# Patient Record
Sex: Male | Born: 1970 | Race: Black or African American | Hispanic: Refuse to answer | Marital: Married
Health system: Midwestern US, Community
[De-identification: ages and names within clinical notes are randomized; demographics above are authoritative.]

## PROBLEM LIST (undated history)

## (undated) DIAGNOSIS — I5043 Acute on chronic combined systolic (congestive) and diastolic (congestive) heart failure: Secondary | ICD-10-CM

## (undated) DIAGNOSIS — K759 Inflammatory liver disease, unspecified: Secondary | ICD-10-CM

## (undated) DIAGNOSIS — N189 Chronic kidney disease, unspecified: Secondary | ICD-10-CM

## (undated) DIAGNOSIS — F1010 Alcohol abuse, uncomplicated: Secondary | ICD-10-CM

## (undated) DIAGNOSIS — F1920 Other psychoactive substance dependence, uncomplicated: Secondary | ICD-10-CM

## (undated) DIAGNOSIS — G4730 Sleep apnea, unspecified: Secondary | ICD-10-CM

## (undated) DIAGNOSIS — I10 Essential (primary) hypertension: Secondary | ICD-10-CM

## (undated) DIAGNOSIS — I340 Nonrheumatic mitral (valve) insufficiency: Secondary | ICD-10-CM

## (undated) DIAGNOSIS — N289 Disorder of kidney and ureter, unspecified: Secondary | ICD-10-CM

## (undated) DIAGNOSIS — K7460 Unspecified cirrhosis of liver: Secondary | ICD-10-CM

## (undated) HISTORY — PX: AV FISTULA PLACEMENT: SHX1204

## (undated) HISTORY — DX: Acute on chronic combined systolic (congestive) and diastolic (congestive) heart failure: I50.43

---

## 2010-12-31 LAB — CBC WITH AUTOMATED DIFF
ABS. BASOPHILS: 0 10*3/uL (ref 0.0–0.06)
ABS. EOSINOPHILS: 0 10*3/uL (ref 0.0–0.4)
ABS. LYMPHOCYTES: 1.2 10*3/uL (ref 0.8–3.5)
ABS. MONOCYTES: 1.4 10*3/uL — ABNORMAL HIGH (ref 0–1.0)
ABS. NEUTROPHILS: 8.3 10*3/uL — ABNORMAL HIGH (ref 1.8–8.0)
BAND NEUTROPHILS: 1 % (ref 0–5)
BASOPHILS: 0 % (ref 0–3)
EOSINOPHILS: 0 % (ref 0–5)
HCT: 41.6 % (ref 36.0–48.0)
HGB: 13.8 g/dL (ref 13.0–16.0)
LYMPHOCYTES: 11 % — ABNORMAL LOW (ref 20–51)
MCH: 27.6 PG (ref 24.0–34.0)
MCHC: 33.2 g/dL (ref 31.0–37.0)
MCV: 83.2 FL (ref 74.0–97.0)
MONOCYTES: 13 % — ABNORMAL HIGH (ref 2–9)
MPV: 11.5 FL (ref 9.2–11.8)
NEUTROPHILS: 75 % (ref 42–75)
PLATELET COMMENTS: ADEQUATE
PLATELET: 173 10*3/uL (ref 135–420)
RBC: 5 M/uL (ref 4.70–5.50)
RDW: 13.7 % (ref 11.6–14.5)
WBC: 10.9 10*3/uL (ref 4.6–13.2)

## 2010-12-31 LAB — CREATININE, UR, RANDOM: Creatinine, urine random: 127 mg/dL — ABNORMAL HIGH (ref 30.0–125.0)

## 2010-12-31 LAB — URINALYSIS W/ RFLX MICROSCOPIC
Bilirubin UA, confirm: NEGATIVE
Bilirubin: NEGATIVE
Glucose: 100 MG/DL — AB
Nitrites: NEGATIVE
Protein: >300 MG/DL — AB
Specific gravity: >1.03 — ABNORMAL HIGH (ref 1.003–1.030)
Urobilinogen: 4 EU/DL — ABNORMAL HIGH (ref 0.2–1.0)
pH (UA): 5 (ref 5.0–8.0)

## 2010-12-31 LAB — METABOLIC PANEL, BASIC
Anion gap: 7 mmol/L (ref 5–15)
Anion gap: 8 mmol/L (ref 5–15)
BUN/Creatinine ratio: 12 (ref 12–20)
BUN/Creatinine ratio: 13 (ref 12–20)
BUN: 63 MG/DL — ABNORMAL HIGH (ref 7–18)
BUN: 62 MG/DL — ABNORMAL HIGH (ref 7–18)
CO2: 29 MMOL/L (ref 21–32)
CO2: 29 MMOL/L (ref 21–32)
Calcium: 7.3 MG/DL — ABNORMAL LOW (ref 8.4–10.4)
Calcium: 7.7 MG/DL — ABNORMAL LOW (ref 8.4–10.4)
Chloride: 97 MMOL/L — ABNORMAL LOW (ref 100–108)
Chloride: 95 MMOL/L — ABNORMAL LOW (ref 100–108)
Creatinine: 5.3 MG/DL — ABNORMAL HIGH (ref 0.6–1.3)
Creatinine: 4.9 MG/DL — ABNORMAL HIGH (ref 0.6–1.3)
GFR est AA: 16 mL/min/{1.73_m2} — ABNORMAL LOW (ref >60)
GFR est AA: 17 mL/min/{1.73_m2} — ABNORMAL LOW (ref >60)
GFR est non-AA: 13 mL/min/{1.73_m2} — ABNORMAL LOW (ref >60)
GFR est non-AA: 14 mL/min/{1.73_m2} — ABNORMAL LOW (ref >60)
Glucose: 109 MG/DL — ABNORMAL HIGH (ref 74–99)
Glucose: 99 MG/DL (ref 74–99)
Potassium: 3 MMOL/L — ABNORMAL LOW (ref 3.5–5.5)
Potassium: 2.6 MMOL/L — CL (ref 3.5–5.5)
Sodium: 133 MMOL/L — ABNORMAL LOW (ref 136–145)
Sodium: 132 MMOL/L — ABNORMAL LOW (ref 136–145)

## 2010-12-31 LAB — CKMB PROFILE
CK - MB: 0.5 ng/ml (ref 0.5–3.6)
CK-MB Index: 0.7 % (ref 0.0–4.0)
CK: 76 U/L (ref 39–308)

## 2010-12-31 LAB — METABOLIC PANEL, COMPREHENSIVE
A-G Ratio: 0.5 — ABNORMAL LOW (ref 0.8–1.7)
A-G Ratio: 0.5 — ABNORMAL LOW (ref 0.8–1.7)
ALT (SGPT): 42 U/L (ref 30–65)
ALT (SGPT): 35 U/L (ref 30–65)
AST (SGOT): 54 U/L — ABNORMAL HIGH (ref 15–37)
AST (SGOT): 42 U/L — ABNORMAL HIGH (ref 15–37)
Albumin: 2.7 g/dL — ABNORMAL LOW (ref 3.4–5.0)
Albumin: 2.2 g/dL — ABNORMAL LOW (ref 3.4–5.0)
Alk. phosphatase: 75 U/L (ref 50–136)
Alk. phosphatase: 64 U/L (ref 50–136)
Anion gap: 8 mmol/L (ref 5–15)
Anion gap: 7 mmol/L (ref 5–15)
BUN/Creatinine ratio: 11 — ABNORMAL LOW (ref 12–20)
BUN/Creatinine ratio: 12 (ref 12–20)
BUN: 62 MG/DL — ABNORMAL HIGH (ref 7–18)
BUN: 60 MG/DL — ABNORMAL HIGH (ref 7–18)
Bilirubin, total: 0.9 MG/DL (ref 0.2–1.0)
Bilirubin, total: 0.6 MG/DL (ref 0.2–1.0)
CO2: 31 MMOL/L (ref 21–32)
CO2: 29 MMOL/L (ref 21–32)
Calcium: 8.5 MG/DL (ref 8.4–10.4)
Calcium: 7.2 MG/DL — ABNORMAL LOW (ref 8.4–10.4)
Chloride: 90 MMOL/L — ABNORMAL LOW (ref 100–108)
Chloride: 96 MMOL/L — ABNORMAL LOW (ref 100–108)
Creatinine: 5.7 MG/DL — ABNORMAL HIGH (ref 0.6–1.3)
Creatinine: 5.2 MG/DL — ABNORMAL HIGH (ref 0.6–1.3)
GFR est AA: 14 mL/min/{1.73_m2} — ABNORMAL LOW (ref >60)
GFR est AA: 16 mL/min/{1.73_m2} — ABNORMAL LOW (ref >60)
GFR est non-AA: 12 mL/min/{1.73_m2} — ABNORMAL LOW (ref >60)
GFR est non-AA: 13 mL/min/{1.73_m2} — ABNORMAL LOW (ref >60)
Globulin: 5.5 g/dL — ABNORMAL HIGH (ref 2.0–4.0)
Globulin: 4.5 g/dL — ABNORMAL HIGH (ref 2.0–4.0)
Glucose: 124 MG/DL — ABNORMAL HIGH (ref 74–99)
Glucose: 113 MG/DL — ABNORMAL HIGH (ref 74–99)
Potassium: 3.1 MMOL/L — ABNORMAL LOW (ref 3.5–5.5)
Potassium: 2.7 MMOL/L — CL (ref 3.5–5.5)
Protein, total: 8.2 g/dL (ref 6.4–8.2)
Protein, total: 6.7 g/dL (ref 6.4–8.2)
Sodium: 129 MMOL/L — ABNORMAL LOW (ref 136–145)
Sodium: 132 MMOL/L — ABNORMAL LOW (ref 136–145)

## 2010-12-31 LAB — SODIUM, UR, RANDOM: Sodium,urine random: 32 MMOL/L (ref 20–110)

## 2010-12-31 LAB — URINE MICROSCOPIC ONLY
RBC: 4 /HPF (ref 0–5)
WBC: 36 /HPF (ref 0–4)

## 2010-12-31 LAB — LIPASE: Lipase: 378 U/L (ref 73–393)

## 2010-12-31 LAB — ETHYL ALCOHOL: ALCOHOL(ETHYL),SERUM: <3 MG/DL (ref 0–3)

## 2010-12-31 LAB — CK: CK: 88 U/L (ref 39–308)

## 2010-12-31 LAB — PROTEIN URINE, RANDOM: Protein, urine random: 162 MG/DL — ABNORMAL HIGH (ref 0.0–11.9)

## 2010-12-31 LAB — MAGNESIUM: Magnesium: 1.9 MG/DL (ref 1.8–2.4)

## 2010-12-31 NOTE — Consults (Unsigned)
[facility]                               [address] [place], [place] [address]                                CONSULTATION REPORT    PATIENT:    Robert Pierce, Robert Pierce  MRN:            [id number]  ADMIT DATE:   12/31/2010  BILLING:        [id number] CONSULTED:  ROOM:       [id number]  ATTENDING:  Chauncey Reading, MD  DICTATING:  Betsy Pries, MD        REASON FOR CONSULTATION: Acute renal failure.    HISTORY OF PRESENT ILLNESS: The patient is an [place] American gentleman  who was admitted last night with fever, chills, back pain, possible  pyelonephritis and renal failure. I was asked to see him for his renal  failure. Apparently the patient has not seen any doctor For a long time. He  abuses alcohol. He also abused drugs. He stated he shoots IV heroin and  other drugs. He is again admitted with fever, chills. Since admission he  had received 5 liters of normal saline without much improvement in his  renal function. Urine output has been low. He denies any dyspnea on  exertion, orthopnea, PND. His main complaint is abdominal pain, flank pain  and fever at home up to 105. Our workup so far revealed evidence of urinary  tract infection. He had a CT scan which showed edema of both kidneys  consistent with pyelonephritis or glomerulonephritis. He has not taken any  medications prior to admission. Denies taking any nonsteroidals.    FAMILY HISTORY: Negative for kidney disease.    SOCIAL HISTORY: Positive for alcohol abuse and IV drug abuse.    PHYSICAL EXAMINATION  VITAL SIGNS: Revealed blood pressure is 140/100, temperature 98.  HEENT: Mouth is dry.  NECK: Supple. No JVD.  LUNGS: Clear to auscultation and percussion.  HEART: Regular rate and rhythm.  ABDOMEN: Benign. He has bilateral CVA tenderness.  EXTREMITIES: No edema.    LABORATORY DATA: Significant for white count 10.9, hemoglobin 15.8,   hematocrit 41.6. Sodium 129, potassium 3.1, chloride 90, CO2 30, glucose  124, BUN 62, creatinine of 5.7. Urinalysis; specific gravity more than  1.03, protein more than 300, positive blood, positive leukocyte esterase.    ASSESSMENT: Acute renal failure, most likely related to dehydration,  pyelonephritis. He has already been started on IV fluids and Rocephin. He  has significant proteinuria. Definitely need to rule out nephrotic  syndrome. He may have glomerulonephritis related to the IV drug abuse. IV  heroin could cause focal sclerosis. With a history of drug abuse will check  hepatitis screen. Will also check for HIV. Will follow and make further  suggestions.                         Preliminary Unless Authenticated          Betsy Pries, MD    KHZ:wmx  D: 12/31/2010 T: 12/31/2010  1:23 P  Job: [id number]  CScriptDoc #: [id number]  cc:   Chauncey Reading, MD        Betsy Pries, MD
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
## 2011-01-01 LAB — METABOLIC PANEL, COMPREHENSIVE
A-G Ratio: 0.5 — ABNORMAL LOW (ref 0.8–1.7)
ALT (SGPT): 38 U/L (ref 30–65)
AST (SGOT): 49 U/L — ABNORMAL HIGH (ref 15–37)
Albumin: 2 g/dL — ABNORMAL LOW (ref 3.4–5.0)
Alk. phosphatase: 65 U/L (ref 50–136)
Anion gap: 9 mmol/L (ref 5–15)
BUN/Creatinine ratio: 14 (ref 12–20)
BUN: 55 MG/DL — ABNORMAL HIGH (ref 7–18)
Bilirubin, total: 0.6 MG/DL (ref 0.2–1.0)
CO2: 23 MMOL/L (ref 21–32)
Calcium: 7.9 MG/DL — ABNORMAL LOW (ref 8.4–10.4)
Chloride: 102 MMOL/L (ref 100–108)
Creatinine: 3.9 MG/DL — ABNORMAL HIGH (ref 0.6–1.3)
GFR est AA: 22 mL/min/{1.73_m2} — ABNORMAL LOW (ref >60)
GFR est non-AA: 18 mL/min/{1.73_m2} — ABNORMAL LOW (ref >60)
Globulin: 4.2 g/dL — ABNORMAL HIGH (ref 2.0–4.0)
Glucose: 86 MG/DL (ref 74–99)
Potassium: 4.2 MMOL/L (ref 3.5–5.5)
Protein, total: 6.2 g/dL — ABNORMAL LOW (ref 6.4–8.2)
Sodium: 134 MMOL/L — ABNORMAL LOW (ref 136–145)

## 2011-01-01 LAB — DRUG SCREEN UR - W/ CONFIRM
AMPHETAMINES: NEGATIVE
BARBITURATES: NEGATIVE
BENZODIAZEPINES: POSITIVE — AB
COCAINE: NEGATIVE
METHADONE: NEGATIVE
OPIATES: POSITIVE — AB
PCP(PHENCYCLIDINE): NEGATIVE
THC (TH-CANNABINOL): NEGATIVE

## 2011-01-01 LAB — METABOLIC PANEL, BASIC
Anion gap: 7 mmol/L (ref 5–15)
BUN/Creatinine ratio: 13 (ref 12–20)
BUN: 57 MG/DL — ABNORMAL HIGH (ref 7–18)
CO2: 28 MMOL/L (ref 21–32)
Calcium: 8 MG/DL — ABNORMAL LOW (ref 8.4–10.4)
Chloride: 96 MMOL/L — ABNORMAL LOW (ref 100–108)
Creatinine: 4.4 MG/DL — ABNORMAL HIGH (ref 0.6–1.3)
GFR est AA: 19 mL/min/{1.73_m2} — ABNORMAL LOW (ref >60)
GFR est non-AA: 16 mL/min/{1.73_m2} — ABNORMAL LOW (ref >60)
Glucose: 91 MG/DL (ref 74–99)
Potassium: 3.2 MMOL/L — ABNORMAL LOW (ref 3.5–5.5)
Sodium: 131 MMOL/L — ABNORMAL LOW (ref 136–145)

## 2011-01-01 LAB — CBC WITH AUTOMATED DIFF
ABS. BASOPHILS: 0 10*3/uL (ref 0.0–0.06)
ABS. EOSINOPHILS: 0.2 10*3/uL (ref 0.0–0.4)
ABS. LYMPHOCYTES: 0.7 10*3/uL — ABNORMAL LOW (ref 0.8–3.5)
ABS. MONOCYTES: 1.4 10*3/uL — ABNORMAL HIGH (ref 0–1.0)
ABS. NEUTROPHILS: 7.4 10*3/uL (ref 1.8–8.0)
BAND NEUTROPHILS: 3 % (ref 0–5)
BASOPHILS: 0 % (ref 0–3)
EOSINOPHILS: 2 % (ref 0–5)
HCT: 36.6 % (ref 36.0–48.0)
HGB: 12.3 g/dL — ABNORMAL LOW (ref 13.0–16.0)
LYMPHOCYTES: 7 % — ABNORMAL LOW (ref 20–51)
MCH: 27.8 PG (ref 24.0–34.0)
MCHC: 33.6 g/dL (ref 31.0–37.0)
MCV: 82.6 FL (ref 74.0–97.0)
MONOCYTES: 14 % — ABNORMAL HIGH (ref 2–9)
MPV: 11 FL (ref 9.2–11.8)
NEUTROPHILS: 74 % (ref 42–75)
PLATELET COMMENTS: ADEQUATE
PLATELET: 148 10*3/uL (ref 135–420)
RBC: 4.43 M/uL — ABNORMAL LOW (ref 4.70–5.50)
RDW: 14 % (ref 11.6–14.5)
WBC: 9.7 10*3/uL (ref 4.6–13.2)

## 2011-01-01 LAB — TSH 3RD GENERATION: TSH: 1.35 u[IU]/mL (ref 0.51–6.27)

## 2011-01-01 LAB — PHOSPHORUS: Phosphorus: 2.8 MG/DL (ref 2.5–4.9)

## 2011-01-01 LAB — MAGNESIUM: Magnesium: 2 MG/DL (ref 1.8–2.4)

## 2011-01-01 LAB — CK: CK: 60 U/L (ref 39–308)

## 2011-01-02 LAB — CBC WITH AUTOMATED DIFF
ABS. BASOPHILS: 0 10*3/uL (ref 0.0–0.06)
ABS. EOSINOPHILS: 0 10*3/uL (ref 0.0–0.4)
ABS. LYMPHOCYTES: 0.6 10*3/uL — ABNORMAL LOW (ref 0.8–3.5)
ABS. MONOCYTES: 1.6 10*3/uL — ABNORMAL HIGH (ref 0–1.0)
ABS. NEUTROPHILS: 10.4 10*3/uL — ABNORMAL HIGH (ref 1.8–8.0)
BAND NEUTROPHILS: 10 % — ABNORMAL HIGH (ref 0–5)
BASOPHILS: 0 % (ref 0–3)
EOSINOPHILS: 0 % (ref 0–5)
HCT: 40.1 % (ref 36.0–48.0)
HGB: 13.5 g/dL (ref 13.0–16.0)
LYMPHOCYTES: 5 % — ABNORMAL LOW (ref 20–51)
MCH: 27.8 PG (ref 24.0–34.0)
MCHC: 33.7 g/dL (ref 31.0–37.0)
MCV: 82.7 FL (ref 74.0–97.0)
MONOCYTES: 13 % — ABNORMAL HIGH (ref 2–9)
MPV: 11.3 FL (ref 9.2–11.8)
NEUTROPHILS: 72 % (ref 42–75)
PLATELET COMMENTS: ADEQUATE
PLATELET: 206 10*3/uL (ref 135–420)
RBC: 4.85 M/uL (ref 4.70–5.50)
RDW: 14.3 % (ref 11.6–14.5)
WBC: 12.6 10*3/uL (ref 4.6–13.2)

## 2011-01-02 LAB — HEPATITIS PANEL, ACUTE
Hep B surface Ag Interp.: NEGATIVE
Hep C virus Ab Interp.: POSITIVE — AB
Hepatitis A, IgM: NEGATIVE
Hepatitis B core, IgM: NEGATIVE
Hepatitis B surface Ag: <0.1 Index (ref <1.00)
Hepatitis C virus Ab: >11 Index — ABNORMAL HIGH (ref <0.80)

## 2011-01-02 LAB — CKMB PROFILE
CK - MB: 0.5 ng/ml (ref 0.5–3.6)
CK-MB Index: 0.7 % (ref 0.0–4.0)
CK: 70 U/L (ref 39–308)

## 2011-01-02 LAB — METABOLIC PANEL, BASIC
Anion gap: 8 mmol/L (ref 5–15)
Anion gap: 9 mmol/L (ref 5–15)
BUN/Creatinine ratio: 13 (ref 12–20)
BUN/Creatinine ratio: 13 (ref 12–20)
BUN: 38 MG/DL — ABNORMAL HIGH (ref 7–18)
BUN: 38 MG/DL — ABNORMAL HIGH (ref 7–18)
CO2: 22 MMOL/L (ref 21–32)
CO2: 23 MMOL/L (ref 21–32)
Calcium: 8.1 MG/DL — ABNORMAL LOW (ref 8.4–10.4)
Calcium: 8.3 MG/DL — ABNORMAL LOW (ref 8.4–10.4)
Chloride: 101 MMOL/L (ref 100–108)
Chloride: 103 MMOL/L (ref 100–108)
Creatinine: 2.9 MG/DL — ABNORMAL HIGH (ref 0.6–1.3)
Creatinine: 2.9 MG/DL — ABNORMAL HIGH (ref 0.6–1.3)
GFR est AA: 31 mL/min/{1.73_m2} — ABNORMAL LOW (ref >60)
GFR est AA: 31 mL/min/{1.73_m2} — ABNORMAL LOW (ref >60)
GFR est non-AA: 26 mL/min/{1.73_m2} — ABNORMAL LOW (ref >60)
GFR est non-AA: 26 mL/min/{1.73_m2} — ABNORMAL LOW (ref >60)
Glucose: 95 MG/DL (ref 74–99)
Glucose: 97 MG/DL (ref 74–99)
Potassium: 4 MMOL/L (ref 3.5–5.5)
Potassium: 4.2 MMOL/L (ref 3.5–5.5)
Sodium: 132 MMOL/L — ABNORMAL LOW (ref 136–145)
Sodium: 134 MMOL/L — ABNORMAL LOW (ref 136–145)

## 2011-01-02 LAB — HEP B SURFACE AB
Hep B surface Ab Interp.: NEGATIVE — AB
Hepatitis B surface Ab: 0.16 Index — ABNORMAL LOW (ref >1.00)

## 2011-01-02 LAB — HEP B SURFACE AG
Hep B surface Ag Interp.: NEGATIVE
Hepatitis B surface Ag: <0.1 Index (ref <1.00)

## 2011-01-02 LAB — HIV 1/2 AB SCREEN W RFLX CONFIRM
HIV 1/2 Interpretation: NONREACTIVE
HIV1/2 INTERPRETATION, HHIVI: NONREACTIVE

## 2011-01-02 LAB — D DIMER: D DIMER: 11.01 ug/ml(FEU) — ABNORMAL HIGH (ref <0.46)

## 2011-01-02 LAB — TROPONIN I: Troponin-I, QT: 0.19 NG/ML — ABNORMAL HIGH (ref 0.00–0.08)

## 2011-01-02 LAB — MAGNESIUM: Magnesium: 1.8 MG/DL (ref 1.8–2.4)

## 2011-01-02 LAB — NT-PRO BNP: NT pro-BNP: 3845 PG/ML — ABNORMAL HIGH (ref 0–450)

## 2011-01-02 LAB — GLUCOSE, POC: Glucose (POC): 108 mg/dL (ref 70–110)

## 2011-01-02 LAB — ANA QL, W/REFLEX CASCADE
ANA, Direct: NOT DETECTED
ANA: NOT DETECTED

## 2011-01-02 LAB — HEPATITIS B SURFACE ANTIBODY
Hep B S Ab Interp: NEGATIVE — AB
Hep B S Ab: 0.16 Index — ABNORMAL LOW (ref >1.00)

## 2011-01-02 LAB — D-DIMER, QUANTITATIVE: D-Dimer, Quant: 11.01 ug/ml(FEU) — ABNORMAL HIGH (ref <0.46)

## 2011-01-02 NOTE — Consults (Addendum)
[facility]                               [address] [place], [place] [address]                                CONSULTATION REPORT    PATIENT:    Robert Pierce, Robert Pierce  MRN:            [id number]  ADMIT DATE:   12/31/2010  BILLING:        [id number] CONSULTED:    01/02/2011  ROOM:       [id number]  ATTENDING:  Chauncey Reading, MD  DICTATING:  Jerlyn Ly, MD        CARDIOLOGY CONSULTATION    REASON FOR CONSULTATION: Congestive heart failure.    HISTORY OF PRESENT ILLNESS: The patient is a 40 year old male who presented  to the hospital 2 days ago, complaining of abdominal pain, back pain,  fever, diarrhea, vomiting, and shaking. According to his significant other,  he was also complaining of some shortness of breath. The patient does have  a longstanding history of heavy alcohol use, drinks at least 6 to 12 beers,  if not more a day. He denies any previous medical history. He has never had  a history of congestive heart failure or previous myocardial infarction. He  does not take any medications. Upon arrival in the emergency department, he  was found to be in acute renal failure with a creatinine of 5.7, and he was  aggressively hydrated with IV fluids. Shortly thereafter, approximately 24  hours later he did develop increased respiratory distress and had a chest  x-ray consistent with congestive heart failure with volume overload, and  was started on intravenous Lasix which did start to diurese him and improve  his symptoms.    PAST MEDICAL HISTORY: None.    ADMISSION MEDICATIONS: None.    ALLERGIES: NO KNOWN DRUG ALLERGIES.    SOCIAL HISTORY: He drinks 6 to 12 beers a day for the past 13 to 14 years.  History of IV drug use in the past. Smokes cigarettes, half a pack a day.    FAMILY HISTORY: Noncontributory for early coronary disease or congenital  heart disease.    REVIEW OF SYSTEMS: As per HPI, otherwise negative for chest pain. Positive   for orthopnea and PND. No leg swelling. No palpitations, dizzy spells, or  syncope. No productive cough. No claudication.    PHYSICAL EXAMINATION  VITAL SIGNS: Temperature is 100, heart rate 100, respirations 20, blood  pressure has ranged from the 130s to 170s/80 to 100. O2 saturations are  currently 95% on room air.  GENERAL: The patient looks his stated age, comfortable, nontoxic, currently  comfortable.  HEENT: Pink conjunctivae. Anicteric sclerae. Moist mucous membranes.  NECK: Supple. There is elevated JVP. No bruits.  CARDIOVASCULAR: Tachycardic, regular rate, positive S3. No appreciable  murmur.  LUNGS: There are diffuse bibasilar rales halfway up his lung fields.  ABDOMEN: Soft, nontender. Positive bowel sounds.  EXTREMITIES: No significant edema, 1+ distal pulses.  NEUROLOGIC: No focal deficits.    EKG dated today shows sinus tachycardia, left atrial abnormality, normal  axis, borderline voltage criteria for LVH, nonspecific ST  abnormality.  Chest x-ray is significant with volume overload and heart failure.    SIGNIFICANT LABORATORY DATA: White count was 12.6 today, hemoglobin  13.5. D-dimer was 11.0. Sodium was 132, potassium 4.2, BUN 38, creatinine  was 2.9. Initial cardiac markers were negative. NT-proBNP level was 3845.    IMPRESSION  1. Acute decompensated congestive heart failure, unknown type, but I  suspect this may be systolic in etiology given his longstanding history of  alcohol use.  2. Hypertension, which has been elevated since arrival, which may all be  related to his heart failure.  3. History of alcohol abuse. Drinks 6 to 12 beers a day for the past 13 or  14 years.  4. Acute renal failure. This has been gradually improving.  5. Polysubstance abuse.    RECOMMENDATIONS  1. I agree with the IV Lasix 40 mg every 12 hours.  2. I agree with increasing his beta blocker; however, if his LV function is  significantly depressed, I would change it over to carvedilol.   3. Start him on hydralazine, isosorbide dinitrate for afterload reduction,  given that he does have some renal dysfunction. I would not start him on an  ACE inhibitor or ARB because of the acute renal failure.  4. Will check an echocardiogram tomorrow to evaluate his LV function.  5. I would continue the alcohol withdrawal prophylaxis.    Thank you for this consultation. We will follow with you during his  hospital stay.                       Electronically Signed   Jerlyn Ly, MD 02/06/2011 13:52          Jerlyn Ly, MD    MJR:WMX  D: 01/02/2011 T: 01/02/2011  2:13 P  Job: [id number]  CScriptDoc #: [id number]  cc:   Chauncey Reading, MD        Jerlyn Ly, MD

## 2011-01-03 LAB — CBC WITH AUTOMATED DIFF
ABS. BASOPHILS: 0 10*3/uL (ref 0.0–0.06)
ABS. EOSINOPHILS: 0.4 10*3/uL (ref 0.0–0.4)
ABS. LYMPHOCYTES: 1.8 10*3/uL (ref 0.8–3.5)
ABS. MONOCYTES: 1.5 10*3/uL — ABNORMAL HIGH (ref 0–1.0)
ABS. NEUTROPHILS: 5.1 10*3/uL (ref 1.8–8.0)
BAND NEUTROPHILS: 4 % (ref 0–5)
BASOPHILS: 0 % (ref 0–3)
EOSINOPHILS: 5 % (ref 0–5)
HCT: 34.2 % — ABNORMAL LOW (ref 36.0–48.0)
HGB: 11.2 g/dL — ABNORMAL LOW (ref 13.0–16.0)
LYMPHOCYTES: 20 % (ref 20–51)
MCH: 27 PG (ref 24.0–34.0)
MCHC: 32.7 g/dL (ref 31.0–37.0)
MCV: 82.4 FL (ref 74.0–97.0)
MONOCYTES: 17 % — ABNORMAL HIGH (ref 2–9)
MPV: 10.4 FL (ref 9.2–11.8)
NEUTROPHILS: 54 % (ref 42–75)
PLATELET COMMENTS: ADEQUATE
PLATELET: 224 10*3/uL (ref 135–420)
RBC: 4.15 M/uL — ABNORMAL LOW (ref 4.70–5.50)
RDW: 14.3 % (ref 11.6–14.5)
WBC: 8.8 10*3/uL (ref 4.6–13.2)

## 2011-01-03 LAB — METABOLIC PANEL, COMPREHENSIVE
A-G Ratio: 0.6 — ABNORMAL LOW (ref 0.8–1.7)
ALT (SGPT): 48 U/L (ref 30–65)
AST (SGOT): 63 U/L — ABNORMAL HIGH (ref 15–37)
Albumin: 2 g/dL — ABNORMAL LOW (ref 3.4–5.0)
Alk. phosphatase: 80 U/L (ref 50–136)
Anion gap: 7 mmol/L (ref 5–15)
BUN/Creatinine ratio: 12 (ref 12–20)
BUN: 30 MG/DL — ABNORMAL HIGH (ref 7–18)
Bilirubin, total: 0.5 MG/DL (ref 0.2–1.0)
CO2: 28 MMOL/L (ref 21–32)
Calcium: 7.6 MG/DL — ABNORMAL LOW (ref 8.4–10.4)
Chloride: 102 MMOL/L (ref 100–108)
Creatinine: 2.6 MG/DL — ABNORMAL HIGH (ref 0.6–1.3)
GFR est AA: 36 mL/min/{1.73_m2} — ABNORMAL LOW (ref >60)
GFR est non-AA: 29 mL/min/{1.73_m2} — ABNORMAL LOW (ref >60)
Globulin: 3.6 g/dL (ref 2.0–4.0)
Glucose: 116 MG/DL — ABNORMAL HIGH (ref 74–99)
Potassium: 3.7 MMOL/L (ref 3.5–5.5)
Protein, total: 5.6 g/dL — ABNORMAL LOW (ref 6.4–8.2)
Sodium: 137 MMOL/L (ref 136–145)

## 2011-01-03 LAB — URINE MICROSCOPIC ONLY
Hyaline cast: 0 (ref 0–2)
RBC: 0 /HPF (ref 0–5)
WBC: 4 /HPF (ref 0–4)

## 2011-01-03 LAB — URINALYSIS W/ RFLX MICROSCOPIC
Bilirubin: NEGATIVE
Glucose: NEGATIVE MG/DL
Ketone: NEGATIVE MG/DL
Leukocyte Esterase: NEGATIVE
Nitrites: NEGATIVE
Protein: NEGATIVE MG/DL
Specific gravity: 1.015 (ref 1.003–1.030)
Urobilinogen: 0.2 EU/DL (ref 0.2–1.0)
pH (UA): 5 (ref 5.0–8.0)

## 2011-01-03 LAB — PHOSPHORUS: Phosphorus: 3.4 MG/DL (ref 2.5–4.9)

## 2011-01-03 LAB — MAGNESIUM: Magnesium: 1.6 MG/DL — ABNORMAL LOW (ref 1.8–2.4)
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
## 2011-01-04 LAB — CBC WITH AUTOMATED DIFF
ABS. BASOPHILS: 0 10*3/uL (ref 0.0–0.06)
ABS. EOSINOPHILS: 0.3 10*3/uL (ref 0.0–0.4)
ABS. LYMPHOCYTES: 2 10*3/uL (ref 0.8–3.5)
ABS. MONOCYTES: 1.9 10*3/uL — ABNORMAL HIGH (ref 0–1.0)
ABS. NEUTROPHILS: 6.1 10*3/uL (ref 1.8–8.0)
BAND NEUTROPHILS: 3 % (ref 0–5)
BASOPHILS: 0 % (ref 0–3)
EOSINOPHILS: 3 % (ref 0–5)
HCT: 35.3 % — ABNORMAL LOW (ref 36.0–48.0)
HGB: 11.6 g/dL — ABNORMAL LOW (ref 13.0–16.0)
LYMPHOCYTES: 19 % — ABNORMAL LOW (ref 20–51)
MCH: 27.6 PG (ref 24.0–34.0)
MCHC: 32.9 g/dL (ref 31.0–37.0)
MCV: 83.8 FL (ref 74.0–97.0)
METAMYELOCYTES: 1 % — ABNORMAL HIGH
MONOCYTES: 18 % — ABNORMAL HIGH (ref 2–9)
MPV: 10 FL (ref 9.2–11.8)
NEUTROPHILS: 56 % (ref 42–75)
PLATELET COMMENTS: INCREASED
PLATELET: 353 10*3/uL (ref 135–420)
RBC: 4.21 M/uL — ABNORMAL LOW (ref 4.70–5.50)
RDW: 14.6 % — ABNORMAL HIGH (ref 11.6–14.5)
WBC: 10.4 10*3/uL (ref 4.6–13.2)

## 2011-01-04 LAB — BLOOD GAS, ARTERIAL POC
Base deficit (POC): 6 mmol/L
HCO3 (POC): 19.2 MMOL/L — ABNORMAL LOW (ref 22–26)
Mask flow rate: 12 L/min
pCO2 (POC): 31.6 MMHG — ABNORMAL LOW (ref 35.0–45.0)
pH (POC): 7.391 (ref 7.35–7.45)
pO2 (POC): 44 MMHG — CL (ref 80–100)
sO2 (POC): 80 % — ABNORMAL LOW (ref 92–97)

## 2011-01-04 LAB — COMPLEMENT, C4: Complement C4: 44 mg/dL Adult — ABNORMAL HIGH (ref 9–36)

## 2011-01-04 LAB — METABOLIC PANEL, BASIC
Anion gap: 6 mmol/L (ref 5–15)
BUN/Creatinine ratio: 10 — ABNORMAL LOW (ref 12–20)
BUN: 23 MG/DL — ABNORMAL HIGH (ref 7–18)
CO2: 33 MMOL/L — ABNORMAL HIGH (ref 21–32)
Calcium: 8.1 MG/DL — ABNORMAL LOW (ref 8.4–10.4)
Chloride: 101 MMOL/L (ref 100–108)
Creatinine: 2.4 MG/DL — ABNORMAL HIGH (ref 0.6–1.3)
GFR est AA: 39 mL/min/{1.73_m2} — ABNORMAL LOW (ref >60)
GFR est non-AA: 32 mL/min/{1.73_m2} — ABNORMAL LOW (ref >60)
Glucose: 92 MG/DL (ref 74–99)
Potassium: 3.8 MMOL/L (ref 3.5–5.5)
Sodium: 140 MMOL/L (ref 136–145)

## 2011-01-04 LAB — MAGNESIUM: Magnesium: 1.7 MG/DL — ABNORMAL LOW (ref 1.8–2.4)

## 2011-01-04 LAB — BENZODIAZEPINE, UR, CONFIRM: Benzodiazepine confirmation: POSITIVE

## 2011-01-04 LAB — COMPLEMENT, C3: Complement C3: 148 mg/dL Adult (ref 90–180)

## 2011-01-05 LAB — CULTURE, URINE
Culture result:: NO GROWTH
Culture: NO GROWTH

## 2011-01-05 LAB — METABOLIC PANEL, BASIC
Anion gap: 4 mmol/L — ABNORMAL LOW (ref 5–15)
BUN/Creatinine ratio: 11 — ABNORMAL LOW (ref 12–20)
BUN: 25 MG/DL — ABNORMAL HIGH (ref 7–18)
CO2: 31 MMOL/L (ref 21–32)
Calcium: 8.2 MG/DL — ABNORMAL LOW (ref 8.4–10.4)
Chloride: 103 MMOL/L (ref 100–108)
Creatinine: 2.2 MG/DL — ABNORMAL HIGH (ref 0.6–1.3)
GFR est AA: 43 mL/min/{1.73_m2} — ABNORMAL LOW (ref >60)
GFR est non-AA: 36 mL/min/{1.73_m2} — ABNORMAL LOW (ref >60)
Glucose: 130 MG/DL — ABNORMAL HIGH (ref 74–99)
Potassium: 3.8 MMOL/L (ref 3.5–5.5)
Sodium: 138 MMOL/L (ref 136–145)

## 2011-01-05 LAB — CBC WITH AUTOMATED DIFF
ABS. BASOPHILS: 0.1 10*3/uL (ref 0.0–0.1)
ABS. EOSINOPHILS: 0.4 10*3/uL (ref 0.0–0.4)
ABS. LYMPHOCYTES: 2.1 10*3/uL (ref 0.9–3.6)
ABS. MONOCYTES: 0.9 10*3/uL (ref 0.05–1.2)
ABS. NEUTROPHILS: 3.9 10*3/uL (ref 1.8–8.0)
BASOPHILS: 1 % (ref 0–2)
EOSINOPHILS: 5 % (ref 0–5)
HCT: 33.7 % — ABNORMAL LOW (ref 36.0–48.0)
HGB: 11.2 g/dL — ABNORMAL LOW (ref 13.0–16.0)
LYMPHOCYTES: 28 % (ref 21–52)
MCH: 27.8 PG (ref 24.0–34.0)
MCHC: 33.2 g/dL (ref 31.0–37.0)
MCV: 83.6 FL (ref 74.0–97.0)
MONOCYTES: 13 % — ABNORMAL HIGH (ref 3–10)
MPV: 9.3 FL (ref 9.2–11.8)
NEUTROPHILS: 53 % (ref 40–73)
PLATELET: 442 10*3/uL — ABNORMAL HIGH (ref 135–420)
RBC: 4.03 M/uL — ABNORMAL LOW (ref 4.70–5.50)
RDW: 14.3 % (ref 11.6–14.5)
WBC: 7.3 10*3/uL (ref 4.6–13.2)

## 2011-01-05 LAB — MAGNESIUM: Magnesium: 2 MG/DL (ref 1.8–2.4)
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
## 2011-01-06 LAB — CULTURE, BLOOD
Culture result:: NO GROWTH
Culture result:: NO GROWTH

## 2011-01-06 LAB — CRYOGLOBULIN, QL: Cryoglobulin: NEGATIVE

## 2011-01-07 NOTE — Telephone Encounter (Signed)
Pt BP running 160/120 will increase Norvasc to 10 mg daily

## 2011-01-13 MED ORDER — AMLODIPINE 10 MG TAB
10 mg | ORAL_TABLET | Freq: Every day | ORAL | Status: DC
Start: 2011-01-13 — End: 2015-07-15

## 2011-01-13 NOTE — Progress Notes (Signed)
Pt in office today establish care.

## 2011-01-13 NOTE — Progress Notes (Signed)
Robert Pierce is a 40 y.o.  male and presents with Establish Care and Hypertension      SUBJECTIVE:  Pt was recently hospitalized with a renal infection and acute renal failure. He improved with antibiotics and his renal function has significantly improved. He was found to have high blood pressure.  It is unknown how long he has had it since he did not go to an MD on any regular basis before. It appears controlled on current meds. He has diastolic heart failure which will be monitored by cardiology. He realizes how important it will be to keep his BP controlled. Pt since he ;left the hospital where he had a catheter placed has had some episodes of enuresis.  He has no problems during the day with holding his urine.   He has been having some headaches which may be from using hydralazine. Hopefully they will resolve soon.       Respiratory ROS: negative for - shortness of breath  Cardiovascular ROS: negative for - chest pain    Current Outpatient Prescriptions   Medication Sig   ??? metoprolol (LOPRESSOR) 50 mg tablet Take  by mouth two (2) times a day.     ??? hydrALAZINE (APRESOLINE) 25 mg tablet Take 25 mg by mouth three (3) times daily.     ??? amLODIPine (NORVASC) 10 mg tablet Take 1 Tab by mouth daily.         OBJECTIVE:  alert, well appearing, and in no distress  BP 108/82   Pulse 58   Temp(Src) 97.8 ??F (36.6 ??C) (Oral)   Resp 18   Ht 6' (1.829 m)   Wt 172 lb (78.019 kg)   BMI 23.33 kg/m2   well developed and well nourished  Eyes - pupils equal and reactive, extraocular eye movements intact, sclera anicteric  Neck - supple, no significant adenopathy, carotids upstroke normal bilaterally, no bruits  Chest - clear to auscultation, no wheezes, rales or rhonchi, symmetric air entry  Heart - normal rate, regular rhythm, normal S1, S2, no murmurs, rubs, clicks or gallops  Abdomen - soft, nontender, nondistended, no masses or organomegaly  Extremities - peripheral pulses normal, no pedal edema, no clubbing or cyanosis   Skin - normal coloration and turgor, no rashes, no suspicious skin lesions noted    Labs:     Lab Results   Component Value Date/Time    ALT 48 01/03/2011  1:04 AM    AST 63 01/03/2011  1:04 AM    Alk. phosphatase 80 01/03/2011  1:04 AM    Bilirubin, total 0.5 01/03/2011  1:04 AM     Lab Results   Component Value Date/Time    GFR est AA 43 01/05/2011  2:00 AM    GFR est non-AA 36 01/05/2011  2:00 AM    Creatinine 2.2 01/05/2011  2:00 AM    BUN 25 01/05/2011  2:00 AM    Sodium 138 01/05/2011  2:00 AM    Potassium 3.8 01/05/2011  2:00 AM    Chloride 103 01/05/2011  2:00 AM    CO2 31 01/05/2011  2:00 AM          Assessment/Plan    1. High blood pressure  Well controlled on current meds METABOLIC PANEL, COMPREHENSIVE   2. Diastolic heart failure  Stable on current therapy.  Pt to f/u with cardiology    3. Migraine headache  May be side effect from hydralazine will continue to monitor for now and use tylenol prn.  4. Borderline high cholesterol  Continue to watch diet for now and check LIPID PANEL   5. Enuresis  Will see if it resolves. If not will refer to urology for further evaluation.      Follow-up Disposition:  Return in about 3 months (around 04/15/2011) for labs 1 week before at [facility].

## 2011-02-03 MED ORDER — HYDRALAZINE 25 MG TAB
25 mg | ORAL_TABLET | Freq: Three times a day (TID) | ORAL | Status: DC
Start: 2011-02-03 — End: 2015-07-15

## 2011-02-03 NOTE — Telephone Encounter (Signed)
Patient called Dr. Eliane Decree office.  They said they did not even have a chart on Mr. Robert Pierce.  However the prescription was written by Dr. Allena Katz.  Patient was told to call back if any problems, so calling back to see if Dr. Shawnie Pons can call in the two prescriptions listed below and the pended prescription.        Vitamin B 1   generic thiamine HCL 100mg   Cerovite Tab    Written by Dr. Amelia Jo  [name].

## 2011-02-13 LAB — CBC WITH AUTOMATED DIFF
ABS. BASOPHILS: 0 10*3/uL (ref 0.0–0.06)
ABS. EOSINOPHILS: 0 10*3/uL (ref 0.0–0.4)
ABS. LYMPHOCYTES: 1.8 10*3/uL (ref 0.8–3.5)
ABS. MONOCYTES: 0.3 10*3/uL (ref 0–1.0)
ABS. NEUTROPHILS: 4.7 10*3/uL (ref 1.8–8.0)
BASOPHILS: 0 % (ref 0–3)
EOSINOPHILS: 0 % (ref 0–5)
HCT: 16.4 % — CL (ref 36.0–48.0)
HGB: 5.5 g/dL — CL (ref 13.0–16.0)
LYMPHOCYTES: 26 % (ref 20–51)
MCH: 27.1 PG (ref 24.0–34.0)
MCHC: 33.5 g/dL (ref 31.0–37.0)
MCV: 80.8 FL (ref 74.0–97.0)
MONOCYTES: 5 % (ref 2–9)
MPV: 10.4 FL (ref 9.2–11.8)
NEUTROPHILS: 69 % (ref 42–75)
PLATELET COMMENTS: ADEQUATE
PLATELET: 196 10*3/uL (ref 135–420)
RBC: 2.03 M/uL — ABNORMAL LOW (ref 4.70–5.50)
RDW: 13.6 % (ref 11.6–14.5)
WBC: 6.8 10*3/uL (ref 4.6–13.2)

## 2011-02-13 LAB — METABOLIC PANEL, COMPREHENSIVE
A-G Ratio: 0.9 (ref 0.8–1.7)
ALT (SGPT): 43 U/L (ref 30–65)
AST (SGOT): 47 U/L — ABNORMAL HIGH (ref 15–37)
Albumin: 2.8 g/dL — ABNORMAL LOW (ref 3.4–5.0)
Alk. phosphatase: 57 U/L (ref 50–136)
Anion gap: 9 mmol/L (ref 5–15)
BUN/Creatinine ratio: 63 — ABNORMAL HIGH (ref 12–20)
BUN: 101 MG/DL — ABNORMAL HIGH (ref 7–18)
Bilirubin, total: 0.5 MG/DL (ref 0.2–1.0)
CO2: 26 MMOL/L (ref 21–32)
Calcium: 8.2 MG/DL — ABNORMAL LOW (ref 8.4–10.4)
Chloride: 101 MMOL/L (ref 100–108)
Creatinine: 1.6 MG/DL — ABNORMAL HIGH (ref 0.6–1.3)
GFR est AA: >60 mL/min/{1.73_m2} (ref >60)
GFR est non-AA: 51 mL/min/{1.73_m2} — ABNORMAL LOW (ref >60)
Globulin: 3.1 g/dL (ref 2.0–4.0)
Glucose: 124 MG/DL — ABNORMAL HIGH (ref 74–99)
Potassium: 3.8 MMOL/L (ref 3.5–5.5)
Protein, total: 5.9 g/dL — ABNORMAL LOW (ref 6.4–8.2)
Sodium: 136 MMOL/L (ref 136–145)

## 2011-02-13 LAB — PTT: aPTT: 30.5 s (ref 24.6–37.7)

## 2011-02-13 LAB — PROTHROMBIN TIME + INR
INR: 1.1 (ref 0.0–1.2)
Prothrombin time: 13.7 s (ref 11.5–15.2)

## 2011-02-13 LAB — NT-PRO BNP: NT pro-BNP: 42 PG/ML (ref 0–450)

## 2011-02-13 NOTE — H&P (Signed)
[facility]                               [address] [place], [place] [address]                               HISTORY AND PHYSICAL    PATIENT:      Robert Pierce, Robert Pierce  MRN:             [id number]    DATE:   02/13/2011  BILLING:         [id number]   DOB:    May 19, 1971  ROOM:  REFERRING:  DICTATING:    Daine Gravel, MD        CHIEF COMPLAINT: Dizziness, palpitations, falling, dark stools.    HISTORY OF PRESENT ILLNESS: The patient is a 40 year old male who was here  back in May, diagnosed with hepatitis C. At that time, also hypertension, a  history of alcohol use, which he said he stopped doing that now. He, at  home for the past several days, has been feeling weak, quite dizzy when he  stands up, nearly passing out, palpitations, dark tarry stools. He states  that he vomited one time yesterday. He came into the hospital today, when  his initial workup showed in the ER, hemoglobin and hematocrit was 5.5 and  16.4. INR was normal.  LFTs essentially normal. He also had been  tachycardic, running at 117 at times. Blood pressure lowest was around  93/57. Currently, he denies having any chest pain or shortness of breath at  this time.    REVIEW OF SYSTEMS  EYES: No blurry vision.  ENT: No runny nose. No sore throat.  RESPIRATORY: Shortness of breath.  CARDIOVASCULAR: Dyspnea on exertion.  GASTROINTESTINAL: As above.  GENITOURINARY: No dysuria.  MUSCULOSKELETAL: He globally feels weak.  NEUROLOGIC: No focal weaknesses, but he globally feels weak, especially  when he stands up. He says he feels like he is going to pass out.  SKIN: Denies any rashes. The rest of the 12-point review of systems asked  is negative.    PAST MEDICAL HISTORY: Hypertension, tobacco use, alcohol use, although he  stopped alcohol, but he still smokes. Recent hepatitis C, questionable  cirrhosis.    MEDICATIONS: He was discharged on  1. Lopressor 50 b.i.d.   2. Hydralazine 25 t.i.d.  3. Norvasc increased to 10 a day.  4. Thiamine.  5. Multivitamin.  6. Albuterol.    ALLERGIES: NONE LISTED.    FAMILY HISTORY: No history of cancer in the family.    SOCIAL HISTORY: He still smokes half pack a day. His girlfriend said  he stopped drinking alcohol since May.    PHYSICAL EXAMINATION  VITAL SIGNS: Right now, temperature is 99.1, blood pressure is 111/63,  pulse around 110, saturating at 99%.  HEENT: Atraumatic, normocephalic.  NECK: Supple. No JVP, no bruits or thyromegaly.  LUNGS: Essentially clear to auscultation.  CARDIOVASCULAR: Regular rate. No murmur.  ABDOMEN: Soft, nontender, positive bowel sounds x4. No rebound, rigidity,  or guarding.  LEGS: No clubbing, cyanosis, or edema.  NEUROLOGIC: Awake and alert, moves all extremities fine, 2+ pedal pulses.    LABORATORY DATA: As stated before.    ASSESSMENT  AND PLAN  1. Acute blood loss anemia. Plan: We are going to type and cross him and  give him 2 units of packed red blood cells, put him on a Protonix drip,  octreotide drip. I have spoken with Dr. Hart Rochester. The patient will need an  EGD. The most likely source looks like his upper, with a high BUN with  black tarry stools. Also, we are going to admit the patient to the ICU for  close monitoring. I have spoken with Dr. Renee Pain as well from pulmonary  critical care. Will check his hemoglobin and hematocrit every 8 hours and  give him more blood as needed.  2. Hypertension. We have the Lopressor at 25 b.i.d. for now. Hold  hydralazine. We will use Norvasc if needed.  3. Hepatitis C, per gastrointestinal.  4. For deep venous thrombosis prophylaxis, he is going to be on sequential  TED.  5. EKG, he had some nonspecific ST-T changes. We will check a couple of  sets of cardiac enzymes as well.  6. Chronic diastolic heart failure. He is currently compensated at this  time.    Critical care time spent reviewing all the records, stabilizing the   patient, talking to the consultants was 40 minutes.                         Electronically Signed   Daine Gravel, MD 02/15/2011 08:01          Daine Gravel, MD    KSS:[id number]  D: 02/13/2011   4:24 P  T: 02/13/2011  5:36 P  Job: [id number]  CScriptDoc #: [id number]    cc:   Mohammed Kindle, MD        Kennon Rounds, MD        J. Maisie Fus, MD        Jerlyn Ly, MD        Daine Gravel, MD

## 2011-02-14 LAB — METABOLIC PANEL, COMPREHENSIVE
A-G Ratio: 1.1 (ref 0.8–1.7)
ALT (SGPT): 48 U/L (ref 30–65)
AST (SGOT): 36 U/L (ref 15–37)
Albumin: 2.9 g/dL — ABNORMAL LOW (ref 3.4–5.0)
Alk. phosphatase: 57 U/L (ref 50–136)
Anion gap: 6 mmol/L (ref 5–15)
BUN/Creatinine ratio: 31 — ABNORMAL HIGH (ref 12–20)
BUN: 47 MG/DL — ABNORMAL HIGH (ref 7–18)
Bilirubin, total: 0.5 MG/DL (ref 0.2–1.0)
CO2: 28 MMOL/L (ref 21–32)
Calcium: 7.9 MG/DL — ABNORMAL LOW (ref 8.4–10.4)
Chloride: 107 MMOL/L (ref 100–108)
Creatinine: 1.5 MG/DL — ABNORMAL HIGH (ref 0.6–1.3)
GFR est AA: >60 mL/min/{1.73_m2} (ref >60)
GFR est non-AA: 55 mL/min/{1.73_m2} — ABNORMAL LOW (ref >60)
Globulin: 2.6 g/dL (ref 2.0–4.0)
Glucose: 127 MG/DL — ABNORMAL HIGH (ref 74–99)
Potassium: 3.3 MMOL/L — ABNORMAL LOW (ref 3.5–5.5)
Protein, total: 5.5 g/dL — ABNORMAL LOW (ref 6.4–8.2)
Sodium: 141 MMOL/L (ref 136–145)

## 2011-02-14 LAB — CBC WITH AUTOMATED DIFF
ABS. BASOPHILS: 0 10*3/uL (ref 0.0–0.06)
ABS. EOSINOPHILS: 0.1 10*3/uL (ref 0.0–0.4)
ABS. LYMPHOCYTES: 2.1 10*3/uL (ref 0.9–3.6)
ABS. MONOCYTES: 0.7 10*3/uL (ref 0.05–1.2)
ABS. NEUTROPHILS: 4 10*3/uL (ref 1.8–8.0)
BASOPHILS: 0 % (ref 0–2)
EOSINOPHILS: 2 % (ref 0–5)
HCT: 20.4 % — ABNORMAL LOW (ref 36.0–48.0)
HGB: 6.7 g/dL — ABNORMAL LOW (ref 13.0–16.0)
LYMPHOCYTES: 30 % (ref 21–52)
MCH: 26.5 PG (ref 24.0–34.0)
MCHC: 32.8 g/dL (ref 31.0–37.0)
MCV: 80.6 FL (ref 74.0–97.0)
MONOCYTES: 10 % (ref 3–10)
MPV: 10 FL (ref 9.2–11.8)
NEUTROPHILS: 58 % (ref 40–73)
PLATELET: 168 10*3/uL (ref 135–420)
RBC: 2.53 M/uL — ABNORMAL LOW (ref 4.70–5.50)
RDW: 14.7 % — ABNORMAL HIGH (ref 11.6–14.5)
WBC: 6.9 10*3/uL (ref 4.6–13.2)

## 2011-02-14 LAB — HEMOGLOBIN
HGB: 6.6 g/dL — ABNORMAL LOW (ref 13.0–16.0)
HGB: 8.3 g/dL — ABNORMAL LOW (ref 13.0–16.0)

## 2011-02-14 LAB — CKMB PROFILE
CK - MB: <0.5 ng/ml — ABNORMAL LOW (ref 0.5–3.6)
CK - MB: <0.5 ng/ml — ABNORMAL LOW (ref 0.5–3.6)
CK: 67 U/L (ref 39–308)
CK: 105 U/L (ref 39–308)

## 2011-02-14 LAB — HEMATOCRIT
HCT: 19.7 % — ABNORMAL LOW (ref 36.0–48.0)
HCT: 24.4 % — ABNORMAL LOW (ref 36.0–48.0)

## 2011-02-14 LAB — AMMONIA: Ammonia, plasma: 24 umol/L (ref 11–32)

## 2011-02-14 LAB — GLUCOSE, POC: Glucose (POC): 159 mg/dL — ABNORMAL HIGH (ref 70–110)

## 2011-02-14 LAB — TROPONIN I
Troponin-I, QT: 0.04 NG/ML (ref 0.00–0.08)
Troponin-I, QT: <0.04 NG/ML (ref 0.00–0.08)

## 2011-02-14 LAB — POTASSIUM: Potassium: 3.8 MMOL/L (ref 3.5–5.5)

## 2011-02-14 NOTE — Op Note (Signed)
[facility]                               [address] [place], [place] [address]                                 OPERATIVE REPORT    PATIENT:     Robert Pierce, Robert Pierce  MRN:            [id number]    DATE:  BILLING:     [id number]  ROOM:        [id number]  ATTENDING:   Daine Gravel, MD  DICTATING:   J. Maisie Fus, MD      PROCEDURE:  Upper endoscopy.    PRE-PROCEDURE DIAGNOSIS:  Upper GI bleeding.    POSTPROCEDURE DIAGNOSIS:  Is 1 cm linear Mallory-Weiss tear at the  gastroesophageal junction, likely source of bleeding.  No evidence of  portal hypertension, no evidence of varices.  No evidence of peptic ulcer  disease.  No active bleeding.  No stigmata of recent bleeding.    OPERATOR:  Dr. Ihor Austin, MD    SEDATION:  Was 6 of Versed, 200 mcg of fentanyl.    OPERATIVE REPORT:  Following informed consent consisting of risks,  benefits, and alternatives, the patient agreed to proceed. Following  adequate sedation, the scope was inserted, advanced to the duodenum. The  duodenum and duodenal bulb, stomach, in forward and retroflexed views, were  normal.  The esophagogastric junction was at 40 cm and a linear 1 cm tear  was seen at the gastroesophageal junction with a clean base, consisting of  a large Mallory-Weiss tear.  This is the likely source of bleeding.  No  stigmata of recent bleeding was noted.  No active bleeding.    The patient tolerated the procedure well.  No immediate complications.    RECOMMENDATIONS  1. Protonix 40 mg per day.  2. Discontinue octreotide.  3. Discontinue Protonix drip.  4. Transfuse to hematocrit in the upper 20s.  5. Suspect patient is continuing to drink alcohol.  Consider AA, consider  alcohol intervention.  6. Followup in our office in 6 to 8 weeks.  7. Call me if you have any questions or concerns.                         Electronically Signed   J. Maisie Fus, MD 02/16/2011 09:49          J. Maisie Fus, MD     JML:[id number]  D: 02/14/2011 T: 02/14/2011  2:02 P  Job: [id number]  CScriptDoc #: [id number]    cc:     J. Maisie Fus, MD          Daine Gravel, MD

## 2011-02-14 NOTE — Consults (Signed)
[facility]                               [address] [place], [place] [address]                                CONSULTATION REPORT    PATIENT:    Robert Pierce, Robert Pierce  MRN:            [id number]  ADMIT DATE:   02/13/2011  BILLING:        [id number] CONSULTED:  ROOM:       [id number]  ATTENDING:  Daine Gravel, MD  DICTATING:  J. Maisie Fus, MD        REASON FOR CONSULTATION: Opinion and advice regarding GI bleeding.    IMPRESSION  1. Gastrointestinal (GI) bleeding, probably upper in nature. Rule out  varices. Rule out peptic ulcer disease with associated severe anemia, now  hemodynamically stable.  2. History of Laennec cirrhosis.  3. History of hepatitis C.  4. 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  5. Renal insufficiency.    RECOMMENDATIONS  1. Risks, benefits, alternatives of upper endoscopy, moderate sedation with  possible therapeutics were discussed. The patient agrees to proceed.  2. Hemoglobin and hematocrit every 6 hours with parameters, transfuse.  3. As per our discussion last night, continue octreotide and Protonix drip  until the results of endoscopy are known.    Thanks for this opportunity.    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 Cardiomyopathy and renal function have now improved. He  presented with 1 to 2 days of melenic stools and scant hematemesis without  dysphagia, reflux symptoms, abdominal pain, bloating, distention, anorexia,  weight loss. He did note black stools but no bloody stools. No fevers or  chills. No significant weight loss.    PAST MEDICAL HISTORY: Includes cardiomyopathy, renal insufficiency,  hepatitis C, Laennec cirrhosis.    REVIEW OF SYSTEMS: No longer drinking alcohol. No abdominal pain,   dysphagia, early satiety noted.    FAMILY HISTORY: Mom is healthy.    SOCIAL HISTORY: He smokes 1-1/2 packs per day, drinks 6-12 cans of beer per  day which he says he has now ceased.    PHYSICAL EXAMINATION  VITAL SIGNS: Temperature is 99, pulse is 88, respirations 18, blood  pressure 110/60.  GENERAL APPEARANCE: No acute distress, healthy.  HEENT: Shows the neck to be supple. Lip lick, gums, ears, nose and throat  are normal.  CARDIAC: Regular rate and rhythm. No murmur, rub or gallop.  LUNGS: Clear.  ABDOMEN: Scaphoid without mass, organomegaly or tenderness. Bowel sounds  present.  EXTREMITIES: Without edema.  NEUROLOGICAL: Without focal motor or cranial nerve deficit. Gait was not  tested. Sensory to touch normal.  PSYCHIATRIC: Affect is appropriate. Recent and remote memory recall is  good.    LABORATORY DATA: Hematocrit is 20, white count 6.9, platelets are 168. INR  is normal. Electrolytes are basically normal other than a slightly low  potassium at 3.3. Renal function shows a BUN of 47 and a creatinine 1.5.  Liver enzymes are within normal limits.                       Electronically Signed   J. Maisie Fus, MD 02/16/2011 09:49          J. Maisie Fus, MD    JML:WMX  D: 02/14/2011 T: 02/14/2011 10:22 A  Job: [id number]  CScriptDoc #: [id number]  cc:   J. Maisie Fus, MD        Daine Gravel, MD

## 2011-02-14 NOTE — Op Note (Signed)
[facility]                               [address] [place], [place] [address]                                 OPERATIVE REPORT    PATIENT:     Robert Pierce, Robert Pierce  MRN:            [id number]    DATE:  BILLING:     [id number]  ROOM:        [id number]  ATTENDING:   Daine Gravel, MD  DICTATING:   J. Maisie Fus, MD      PROCEDURE:  Upper endoscopy.    PRE-PROCEDURE DIAGNOSIS:  Upper GI bleeding.    POSTPROCEDURE DIAGNOSIS:  Is 1 cm linear Mallory-Weiss tear at the  gastroesophageal junction, likely source of bleeding.  No evidence of  portal hypertension, no evidence of varices.  No evidence of peptic ulcer  disease.  No active bleeding.  No stigmata of recent bleeding.    OPERATOR:  Dr. Ihor Austin, MD    SEDATION:  Was 6 of Versed, 200 mcg of fentanyl.    OPERATIVE REPORT:  Following informed consent consisting of risks,  benefits, and alternatives, the patient agreed to proceed. Following  adequate sedation, the scope was inserted, advanced to the duodenum. The  duodenum and duodenal bulb, stomach, in forward and retroflexed views, were  normal.  The esophagogastric junction was at 40 cm and a linear 1 cm tear  was seen at the gastroesophageal junction with a clean base, consisting of  a large Mallory-Weiss tear.  This is the likely source of bleeding.  No  stigmata of recent bleeding was noted.  No active bleeding.    The patient tolerated the procedure well.  No immediate complications.    RECOMMENDATIONS  1. Protonix 40 mg per day.  2. Discontinue octreotide.  3. Discontinue Protonix drip.  4. Transfuse to hematocrit in the upper 20s.  5. Suspect patient is continuing to drink alcohol.  Consider AA, consider  alcohol intervention.  6. Followup in our office in 6 to 8 weeks.  7. Call me if you have any questions or concerns.                         Electronically Signed   J. Maisie Fus, MD 02/16/2011 09:49          J. Maisie Fus,  MD    JML:WMX  D: 02/14/2011 T: 02/14/2011  2:02 P  Job: [id number]  CScriptDoc #: [id number]    cc:     J. Maisie Fus, MD          Daine Gravel, MD

## 2011-02-15 LAB — HEMATOCRIT
HCT: 24 % — ABNORMAL LOW (ref 36.0–48.0)
HCT: 25.5 % — ABNORMAL LOW (ref 36.0–48.0)

## 2011-02-15 LAB — HEMOGLOBIN
HGB: 7.9 g/dL — ABNORMAL LOW (ref 13.0–16.0)
HGB: 8.4 g/dL — ABNORMAL LOW (ref 13.0–16.0)

## 2011-02-16 NOTE — Discharge Summary (Signed)
[facility]                               [address] [place], [place] [address]                                 DISCHARGE SUMMARY    PATIENT:   Robert Pierce, Robert Pierce  MRN:           [id number]  ADMIT DATE:    02/13/2011  BILLING:       [id number] [facility] DATE:     02/15/2011  ATTENDING: Daine Gravel, MD  DICTATING  Chauncey Reading, MD      DISCHARGE DIAGNOSES:  1. Acute upper gastrointestinal blood loss anemia secondary to  Mallory-Weiss tear.  2. Probable ongoing alcohol abuse.  3. Hypertension.  4. Hepatitis C.    CONSULTATIONS: Dr. Hart Rochester.    PROCEDURE: Upper endoscopy 02/14/2011 showing the esophagogastric junction  was at 40 cm and a linear 1 cm tear was seen at the GE junction with a  clean base consisting of a large Mallory-Weiss tear, likely source of  bleeding. No stigmata of recent bleeding was noted. No active bleeding.    HISTORY OF PRESENT ILLNESS AND HOSPITAL COURSE: The patient is a  39 year old [place] American male with a past medical history as stated  above. He had presented to the hospital with a chief complaint of  dizziness, palpitations. He had been seeing black tarry stools. When he  presented to the hospital he was anemic with a hemoglobin and hematocrit of  5.5 and 16.4. His INR was normal. His LFTs were essentially normal. He was  hypotensive at 93/57. He was transferred to the ICU. He underwent upper  endoscopy with the results as stated above. He was placed on a PPI. He was  also placed on octreotide. Both of the drips were eventually stopped. He  was continued on his home medications which had consisted of beta blocker,  Protonix. There was some question of whether the patient was continuing to  drink. He had denied this. He said he has not had any alcohol since May. He  was typed and crossed and transfused a total of 5 units. Today his  hemoglobin and hematocrit is 8.4 and 25.5 and he is getting another unit of   packed RBCs. His vital signs are stable on the day of discharge,  symptomatic. He is not having any further symptoms. No further GI blood  loss and he is discharged today to home.    DISCHARGE MEDICATIONS:  1. Protonix 40 mg daily.  2. Metoprolol 25 every 12 hours.  3. Hydralazine 25 t.i.d.  4. Norvasc 10 mg daily.  5. Thiamine, multivitamin and albuterol as needed.    DISCHARGE INSTRUCTIONS: He is discharged to home. He needs to followup with  Dr. Hart Rochester. He needs to continue to stop drinking alcohol.                     Electronically Signed   Chauncey Reading, MD 02/23/2011 13:23          Chauncey Reading, MD    MSC:WMX  D: 02/15/2011 T: 02/16/2011  9:54 A  Job: [id number]  CScriptDoc #: [id number]  cc:   Chauncey Reading, MD        NOEL Shawnie Pons, MD        J. Maisie Fus, MD        Daine Gravel, MD

## 2011-02-17 MED ORDER — TRAZODONE 100 MG TAB
100 mg | ORAL_TABLET | Freq: Every evening | ORAL | Status: DC
Start: 2011-02-17 — End: 2015-07-15

## 2011-02-17 NOTE — Progress Notes (Signed)
Pt in office today for hosp follow up.

## 2011-02-19 NOTE — Progress Notes (Signed)
Robert Pierce is a 40 y.o.  male and presents with Hospital Follow Up, GERD and Depression      SUBJECTIVE:  Pt had an episode upper GI bleeding due to M-W tear.  He one episode of vomiting that may have caused it but was dizzy and fatigue for 2 days before.  He and his friend deny that he is drinking alcohol. He will continue on Protonix which he received from the hospital until it is finished in an few weeks.  Then will give him samples of Nexium since he has no insurance. Pt also has been having problems with insomnia since he stopped drinking a few months ago.  He also has been having problems with depression.       Respiratory ROS: negative for - shortness of breath  Cardiovascular ROS: negative for - chest pain    Current Outpatient Prescriptions   Medication Sig   ??? pantoprazole (PROTONIX) 40 mg tablet Take 40 mg by mouth daily.     ??? traZODone (DESYREL) 100 mg tablet Take 1 Tab by mouth nightly.   ??? citalopram (CELEXA) 20 mg tablet Take 1 Tab by mouth daily.   ??? hydrALAZINE (APRESOLINE) 25 mg tablet Take 1 Tab by mouth three (3) times daily.   ??? thiamine (B-1) 100 mg tablet Take 1 Tab by mouth daily.   ??? multivitamin-iron, hematinic, (CEROVITE ADVANCED FORMULA) 27-0.4 mg Tab tablet Take 1 Tab by mouth daily.   ??? metoprolol (LOPRESSOR) 50 mg tablet Take  by mouth two (2) times a day.     ??? amLODIPine (NORVASC) 10 mg tablet Take 1 Tab by mouth daily.         OBJECTIVE:  alert, well appearing, and in no distress  BP 124/84   Pulse 82   Resp 16   Ht 6' (1.829 m)   Wt 170 lb (77.111 kg)   BMI 23.06 kg/m2   well developed and well nourished  Eyes - pupils equal and reactive, extraocular eye movements intact, sclera anicteric  Neck - supple, no significant adenopathy, carotids upstroke normal bilaterally, no bruits  Chest - clear to auscultation, no wheezes, rales or rhonchi, symmetric air entry  Heart - normal rate, regular rhythm, normal S1, S2, no murmurs, rubs, clicks or gallops   Extremities - peripheral pulses normal, no pedal edema, no clubbing or cyanosis  Skin - normal coloration and turgor, no rashes, no suspicious skin lesions noted    Labs:   Lab Results   Component Value Date/Time    WBC 6.9 02/14/2011  6:40 AM    HGB 8.4 02/15/2011  8:40 AM    HCT 25.5 02/15/2011  8:40 AM    PLATELET 168 02/14/2011  6:40 AM    MCV 80.6 02/14/2011  6:40 AM         Assessment/Plan    1. Mallory - Weiss tear  Will continue on pantoprazole (PROTONIX) 40 mg tablet and then change to Nexium samples he was given today to ensure healing.    2. Insomnia  Will treat with traZODone (DESYREL) 100 mg tablet QHS   3. Depression  Will treat with citalopram (CELEXA) 20 mg tablet daily.  Pt aware to call if any suicidal ideas and stop the med. Pt has regular f/u in 6 weeks      Follow-up Disposition:  Return if symptoms worsen or fail to improve.

## 2011-02-21 LAB — TYPE + CROSSMATCH
ABO/Rh(D): O POS
Antibody screen: NEGATIVE
Status of unit: TRANSFUSED
Status of unit: TRANSFUSED
Status of unit: TRANSFUSED
Status of unit: TRANSFUSED
Status of unit: TRANSFUSED
Unit division: 0
Unit division: 0
Unit division: 0
Unit division: 0
Unit division: 0

## 2011-02-21 MED ORDER — FUROSEMIDE 20 MG TAB
20 mg | ORAL_TABLET | Freq: Every day | ORAL | Status: DC
Start: 2011-02-21 — End: 2015-07-15

## 2011-02-21 NOTE — Progress Notes (Signed)
HISTORY OF PRESENT ILLNESS  Robert Pierce is a 40 y.o. male.    HPI    The patient presents for a posthospital visit. The patient has a past medical history significant for ongoing alcohol abuse, hypertensive cardiovascular disease, with diastolic dysfunction.  The patient was admitted to the hospital 2 months ago with poorly controlled hypertension, acute pulmonary edema, and acute diastolic heart failure.  He was diuresed with intravenous Lasix and started on multiple and hypertensive agents.  He underwent an echocardiogram which showed normal left ventricular ejection fraction of 55-60%.  Concentric LVH with grade 2 diastolic dysfunction.    The patient was readmitted to the hospital just 2 weeks ago for upper GI bleed secondary to a Mallory-Weiss tear.  Since his hospital discharge, patient states he has been feeling well without shortness of breath, no significant orthopnea or PND.  No significant leg swelling.  He does admit that his weight has gone approximately 15 pounds over the past several weeks, though he states he is now eating more.    Past Medical History   Diagnosis Date   ??? High blood pressure 01/13/2011   ??? Migraine headache 01/13/2011   ??? History of echocardiogram 01/03/2011     EF 55-60%.  Gr 2 DDfx.  Increased RA pressure.  No R-L shunt.  Mild RAE.     ??? Gastrointestinal bleeding    ??? Hepatitis C    ??? Alcohol abuse    ??? Diastolic heart failure       Current Outpatient Prescriptions   Medication Sig Dispense Refill   ??? albuterol (PROVENTIL HFA, VENTOLIN HFA) 90 mcg/actuation inhaler Take 1 Puff by inhalation as needed.         ??? furosemide (LASIX) 20 mg tablet Take 1 Tab by mouth daily.  30 Tab  5   ??? pantoprazole (PROTONIX) 40 mg tablet Take 40 mg by mouth daily.         ??? traZODone (DESYREL) 100 mg tablet Take 1 Tab by mouth nightly.  30 Tab  5   ??? citalopram (CELEXA) 20 mg tablet Take 1 Tab by mouth daily.  30 Tab  4    ??? hydrALAZINE (APRESOLINE) 25 mg tablet Take 1 Tab by mouth three (3) times daily.  90 Tab  5   ??? thiamine (B-1) 100 mg tablet Take 1 Tab by mouth daily.  30 Tab  5   ??? multivitamin-iron, hematinic, (CEROVITE ADVANCED FORMULA) 27-0.4 mg Tab tablet Take 1 Tab by mouth daily.  30 Tab  5   ??? metoprolol (LOPRESSOR) 50 mg tablet Take  by mouth two (2) times a day.         ??? amLODIPine (NORVASC) 10 mg tablet Take 1 Tab by mouth daily.  30 Tab  5      No Known Allergies     Review of Systems   Constitutional: Negative for fever, chills and weight loss.        Weight gain    HENT: Negative for nosebleeds.    Eyes: Negative for blurred vision and double vision.   Respiratory: Negative for cough, shortness of breath and wheezing.    Cardiovascular: Positive for palpitations. Negative for chest pain, orthopnea, claudication, leg swelling and PND.   Gastrointestinal: Negative for heartburn, nausea, vomiting and abdominal pain.   Genitourinary: Negative for dysuria and hematuria.   Musculoskeletal: Negative for myalgias and falls.   Skin: Negative for rash.   Neurological: Positive for dizziness. Negative for focal weakness  and headaches.   Endo/Heme/Allergies: Does not bruise/bleed easily.   Psychiatric/Behavioral: Negative for substance abuse.     BP 102/78   Pulse 66   Ht 6' (1.829 m)   Wt 183 lb (83.008 kg)   BMI 24.82 kg/m2   SpO2 100%   Physical Exam   Constitutional: He is oriented to person, place, and time. He appears well-developed and well-nourished.   HENT:   Head: Normocephalic and atraumatic.   Eyes: Conjunctivae are normal.   Neck: Neck supple. No JVD present. Carotid bruit is not present.   Cardiovascular: Normal rate, regular rhythm, S1 normal, S2 normal and normal pulses.  Exam reveals gallop and S4. Exam reveals no S3.    No murmur heard.  Pulmonary/Chest: He has decreased breath sounds. He has no wheezes. He has no rales.   Abdominal: Soft. Bowel sounds are normal. There is no tenderness.    Musculoskeletal: He exhibits edema (trace bilateral lower extremity).   Neurological: He is alert and oriented to person, place, and time.   Skin: Skin is warm and dry.     EKG: Sinus, left atrial enlargement, borderline voltage criteria for LVH, no ST or T wave abnormalities.    ASSESSMENT and PLAN    Hypertensive cardiovascular disease. Patient's blood pressure is currently well controlled on his current regimen of hydralazine, metoprolol, amlodipine.  All of which are continue.    Diastolic heart failure.  The patient has gained 15 pounds in weight over the past 2 weeks.  He does appear to have mild volume overload, and has an S4 on exam.  I would like to start him on a low dosage of Lasix 20 mg daily, and I like him to return in one to 2 weeks for a Chem-7 to check.    Alcohol abuse.  Patient states he is currently in the process of trying to quit drinking.  He was previously drinking 6-12 beers a day and has for the past 10-15 years.    Hepatitis C.  The patient does have a history of polysubstance abuse in the past.    Followup in 6 months, sooner if needed

## 2011-03-14 LAB — METABOLIC PANEL, BASIC
BUN/Creatinine ratio: 9 (ref 8–19)
BUN: 11 mg/dL (ref 6–20)
CO2: 27 mmol/L (ref 20–32)
Calcium: 9.4 mg/dL (ref 8.7–10.2)
Chloride: 99 mmol/L (ref 97–108)
Creatinine: 1.18 mg/dL (ref 0.76–1.27)
GFR est non-AA: 77 mL/min/{1.73_m2} (ref >59)
Glucose: 75 mg/dL (ref 65–99)
Potassium: 3.8 mmol/L (ref 3.5–5.2)
Sodium: 138 mmol/L (ref 134–144)
eGFR If African American: 89 mL/min/{1.73_m2} (ref >59)

## 2011-03-14 NOTE — Progress Notes (Signed)
Quick Note:    Please review BMP-low dosage of Lasix 20 mg daily, and I like him to return in one to 2 weeks for a Chem-7 to check  ______

## 2011-03-20 NOTE — Telephone Encounter (Signed)
LMOM for patient

## 2011-03-20 NOTE — Telephone Encounter (Signed)
Message copied by Orlin Hilding on Mon Mar 20, 2011  9:50 AM  ------       Message from: Jerlyn Ly       Created: Mon Mar 20, 2011  8:15 AM       Regarding: Chem-7         Labs look okay, patient can continue the Lasix 20 mg daily       ----- Message -----          From: Orlin Hilding          Sent: 03/14/2011  10:47 AM            To: Jerlyn Ly, MD              Please review BMP-low dosage of Lasix 20 mg daily, and I like him to return in one to 2 weeks for a Chem-7 to check

## 2011-04-11 NOTE — Progress Notes (Signed)
Patient in office today for 2 month follow up .

## 2011-04-11 NOTE — Progress Notes (Signed)
Robert Pierce is a 40 y.o.  male and presents with Follow-up, Hypertension, Cholesterol Problem, Other, Migraine, Shaking and Depression      SUBJECTIVE:  Pt's BP is elevated today since he ran out of his Norvasc for a few weeks, he restarted about 3 days ago.  Pt is sleeping better with trazodone but his spouse has noticed that he is shaking a lot in his sleep. This may be due to restless leg syndrome becoming more predominant since pt is sleeping better with trazodone. He continues to snore significantly. He needs a sleep study but has not received one yet due to to insurance issues.   His mood/depression is better with celexa.       Respiratory ROS: negative for - shortness of breath  Cardiovascular ROS: negative for - chest pain    Current Outpatient Prescriptions   Medication Sig   ??? albuterol (PROVENTIL HFA, VENTOLIN HFA) 90 mcg/actuation inhaler Take 1 Puff by inhalation as needed.     ??? furosemide (LASIX) 20 mg tablet Take 1 Tab by mouth daily.   ??? pantoprazole (PROTONIX) 40 mg tablet Take 40 mg by mouth daily.     ??? traZODone (DESYREL) 100 mg tablet Take 1 Tab by mouth nightly.   ??? citalopram (CELEXA) 20 mg tablet Take 1 Tab by mouth daily.   ??? hydrALAZINE (APRESOLINE) 25 mg tablet Take 1 Tab by mouth three (3) times daily.   ??? thiamine (B-1) 100 mg tablet Take 1 Tab by mouth daily.   ??? multivitamin-iron, hematinic, (CEROVITE ADVANCED FORMULA) 27-0.4 mg Tab tablet Take 1 Tab by mouth daily.   ??? metoprolol (LOPRESSOR) 50 mg tablet Take  by mouth two (2) times a day.     ??? amLODIPine (NORVASC) 10 mg tablet Take 1 Tab by mouth daily.         OBJECTIVE:  alert, well appearing, and in no distress  BP 142/98   Pulse 84   Temp(Src) 98 ??F (36.7 ??C) (Oral)   Resp 12   Ht 6' (1.829 m)   Wt 169 lb (76.658 kg)   BMI 22.92 kg/m2   well developed and well nourished  Eyes - pupils equal and reactive, extraocular eye movements intact, sclera anicteric   Neck - supple, no significant adenopathy, carotids upstroke normal bilaterally, no bruits  Chest - clear to auscultation, no wheezes, rales or rhonchi, symmetric air entry  Heart - normal rate, regular rhythm, normal S1, S2, no murmurs, rubs, clicks or gallops  Extremities - peripheral pulses normal, no pedal edema, no clubbing or cyanosis  Skin - normal coloration and turgor, no rashes, no suspicious skin lesions noted    Labs:   Lab Results   Component Value Date/Time    GFR est AA >60 02/14/2011  6:40 AM    GFR est non-AA 77 03/13/2011  2:16 AM    Creatinine 1.18 03/13/2011  2:16 AM    BUN 11 03/13/2011  2:16 AM    Sodium 138 03/13/2011  2:16 AM    Potassium 3.8 03/13/2011  2:16 AM    Chloride 99 03/13/2011  2:16 AM    CO2 27 03/13/2011  2:16 AM          Assessment/Plan    1. High blood pressure  Poorly controlled due to poor compliance. Pt now back on all BP meds    2. Insomnia                                                                     Better on trazodone    3. Episode of shaking                                                     May be RLS but pt needs a sleep study to definitively evaluate.    4. Diastolic heart failure                                                 Stable on current meds, and followed by cardiology.    5  Depression                                                                  Improved on Celexa.       Follow-up Disposition:  Return in about 4 months (around 08/11/2011).

## 2011-04-11 NOTE — Patient Instructions (Signed)
High Blood Pressure: After Your Visit  Your Care Instructions  If your blood pressure is usually above 140/90, you have high blood pressure, or hypertension. Despite what a lot of people think, high blood pressure usually doesn't cause headaches or make you feel dizzy or lightheaded. It usually has no symptoms. But it does increase your risk for heart attack, stroke, and kidney or eye damage. The higher your blood pressure, the more your risk increases.  Your doctor will give you a goal for your blood pressure. This goal may be below 140/90. Or it may be even lower if you have other health problems, such as diabetes, heart failure, or coronary artery disease.  Changes in your lifestyle, such as staying at a healthy weight, may help you lower your blood pressure. Your treatment also will include medicine. If you stop taking your medicine, your blood pressure will go back up.  Follow-up care is a key part of your treatment and safety. Be sure to make and go to all appointments, and call your doctor if you are having problems. It???s also a good idea to know your test results and keep a list of the medicines you take.  How can you care for yourself at home?  Medical treatment  ?? Take your medicine exactly as prescribed. You may take one or more types of medicine to lower your blood pressure. They include diuretics, beta-blockers, ACE inhibitors, calcium channel blockers, angiotensin II receptor blockers, and other medicines. Call your doctor if you think you are having a problem with your medicine.   ?? Your doctor may suggest that you take one low-dose aspirin (81 mg) a day. This can help reduce your risk of having a stroke or heart attack.   ?? See your doctor at least 2 times a year. You may need to see the doctor more often at first or until your blood pressure comes down.    ?? If you are taking blood pressure medicine, talk to your doctor before you take decongestants or anti-inflammatory medicine, such as ibuprofen. Some of these medicines can raise blood pressure.   ?? Learn how to check your blood pressure at home.   Lifestyle changes  ?? Stay at a healthy weight. This is especially important if you put on weight around the waist. Losing even 10 pounds can help you lower your blood pressure.   ?? If your doctor recommends it, get more exercise. Walking is a good choice. Bit by bit, increase the amount you walk every day. Try for at least 30 minutes on most days of the week. You also may want to swim, bike, or do other activities.   ?? Avoid or limit alcohol. Talk to your doctor about whether you can drink any alcohol.   ?? Limit salt.   ?? Eat plenty of fruits (such as bananas and oranges), vegetables, legumes, whole grains, and low-fat dairy products.   ?? Lower the amount of saturated fat in your diet. Saturated fat is found in animal products such as milk, cheese, and meat. Limiting these foods may help you lose weight and also lower your risk for heart disease.   ?? Do not smoke. Smoking increases your risk for heart attack and stroke. If you need help quitting, talk to your doctor about stop-smoking programs and medicines. These can increase your chances of quitting for good.   When should you call for help?  Call your doctor now or seek immediate medical care if:  ?? Your blood   pressure is much higher than normal (such as 180/110 or higher).   ?? You think high blood pressure is causing symptoms such as:   ?? Severe headache.   ?? Blurry vision.   ?? Nausea or vomiting.   Watch closely for changes in your health, and be sure to contact your doctor if:  ?? You do not get better as expected.     Where can you learn more?    Go to http://www.healthwise.net/[facility]   Enter X567 in the search box to learn more about "High Blood Pressure: After Your Visit."     ?? 2006-2012 [employer]. Care instructions adapted under license by [place] [facility] (which disclaims liability or warranty for this information). This care instruction is for use with your licensed healthcare professional. If you have questions about a medical condition or this instruction, always ask your healthcare professional. Health[employer], Incorporated disclaims any warranty or liability for your use of this information.  Content Version: 9.4.94723; Last Revised: November 30, 2009

## 2013-04-19 ENCOUNTER — Encounter (HOSPITAL_COMMUNITY): Payer: Self-pay | Admitting: Emergency Medicine

## 2013-04-19 ENCOUNTER — Emergency Department (HOSPITAL_COMMUNITY): Payer: Medicaid - Out of State

## 2013-04-19 ENCOUNTER — Inpatient Hospital Stay (HOSPITAL_COMMUNITY)
Admission: EM | Admit: 2013-04-19 | Discharge: 2013-04-24 | DRG: 682 | Disposition: A | Discharge status: Home / Self Care | Payer: Medicaid - Out of State | Attending: Internal Medicine | Admitting: Internal Medicine

## 2013-04-19 DIAGNOSIS — K7460 Unspecified cirrhosis of liver: Secondary | ICD-10-CM | POA: Diagnosis present

## 2013-04-19 DIAGNOSIS — Z91199 Patient's noncompliance with other medical treatment and regimen due to unspecified reason: Secondary | ICD-10-CM

## 2013-04-19 DIAGNOSIS — Z9119 Patient's noncompliance with other medical treatment and regimen: Secondary | ICD-10-CM

## 2013-04-19 DIAGNOSIS — K759 Inflammatory liver disease, unspecified: Secondary | ICD-10-CM | POA: Diagnosis present

## 2013-04-19 DIAGNOSIS — N19 Unspecified kidney failure: Secondary | ICD-10-CM

## 2013-04-19 DIAGNOSIS — I120 Hypertensive chronic kidney disease with stage 5 chronic kidney disease or end stage renal disease: Principal | ICD-10-CM | POA: Diagnosis present

## 2013-04-19 DIAGNOSIS — I5042 Chronic combined systolic (congestive) and diastolic (congestive) heart failure: Secondary | ICD-10-CM | POA: Diagnosis present

## 2013-04-19 DIAGNOSIS — G4733 Obstructive sleep apnea (adult) (pediatric): Secondary | ICD-10-CM | POA: Diagnosis present

## 2013-04-19 DIAGNOSIS — I5043 Acute on chronic combined systolic (congestive) and diastolic (congestive) heart failure: Secondary | ICD-10-CM

## 2013-04-19 DIAGNOSIS — E876 Hypokalemia: Secondary | ICD-10-CM | POA: Diagnosis present

## 2013-04-19 DIAGNOSIS — N184 Chronic kidney disease, stage 4 (severe): Secondary | ICD-10-CM

## 2013-04-19 DIAGNOSIS — D638 Anemia in other chronic diseases classified elsewhere: Secondary | ICD-10-CM | POA: Diagnosis present

## 2013-04-19 DIAGNOSIS — I5032 Chronic diastolic (congestive) heart failure: Secondary | ICD-10-CM | POA: Diagnosis present

## 2013-04-19 DIAGNOSIS — I161 Hypertensive emergency: Secondary | ICD-10-CM | POA: Diagnosis present

## 2013-04-19 DIAGNOSIS — E8770 Fluid overload, unspecified: Secondary | ICD-10-CM | POA: Insufficient documentation

## 2013-04-19 DIAGNOSIS — N179 Acute kidney failure, unspecified: Secondary | ICD-10-CM | POA: Diagnosis present

## 2013-04-19 DIAGNOSIS — Z79899 Other long term (current) drug therapy: Secondary | ICD-10-CM

## 2013-04-19 DIAGNOSIS — I509 Heart failure, unspecified: Secondary | ICD-10-CM | POA: Diagnosis present

## 2013-04-19 DIAGNOSIS — N185 Chronic kidney disease, stage 5: Secondary | ICD-10-CM | POA: Diagnosis present

## 2013-04-19 DIAGNOSIS — I10 Essential (primary) hypertension: Secondary | ICD-10-CM | POA: Diagnosis present

## 2013-04-19 HISTORY — DX: Acute on chronic combined systolic (congestive) and diastolic (congestive) heart failure: I50.43

## 2013-04-19 HISTORY — DX: Disorder of kidney and ureter, unspecified: N28.9

## 2013-04-19 HISTORY — DX: Alcohol abuse, uncomplicated: F10.10

## 2013-04-19 HISTORY — DX: Unspecified cirrhosis of liver: K74.60

## 2013-04-19 HISTORY — DX: Sleep apnea, unspecified: G47.30

## 2013-04-19 HISTORY — DX: Other psychoactive substance dependence, uncomplicated: F19.20

## 2013-04-19 HISTORY — DX: Inflammatory liver disease, unspecified: K75.9

## 2013-04-19 HISTORY — DX: Essential (primary) hypertension: I10

## 2013-04-19 LAB — COMPREHENSIVE METABOLIC PANEL
ALT: 8 U/L (ref 0–53)
AST: 16 U/L (ref 0–37)
Albumin: 2.7 g/dL — ABNORMAL LOW (ref 3.5–5.2)
Alkaline Phosphatase: 50 U/L (ref 39–117)
BUN: 49 mg/dL — ABNORMAL HIGH (ref 6–23)
CO2: 23 mEq/L (ref 19–32)
Calcium: 8.7 mg/dL (ref 8.4–10.5)
Chloride: 99 mEq/L (ref 96–112)
Creatinine, Ser: 5.95 mg/dL — ABNORMAL HIGH (ref 0.50–1.35)
GFR calc Af Amer: 12 mL/min — ABNORMAL LOW (ref >90)
GFR calc non Af Amer: 11 mL/min — ABNORMAL LOW (ref >90)
Glucose, Bld: 98 mg/dL (ref 70–99)
Potassium: 2.8 mEq/L — ABNORMAL LOW (ref 3.5–5.1)
Sodium: 136 mEq/L (ref 135–145)
Total Bilirubin: 0.7 mg/dL (ref 0.3–1.2)
Total Protein: 7.1 g/dL (ref 6.0–8.3)

## 2013-04-19 LAB — BASIC METABOLIC PANEL
BUN: 49 mg/dL — ABNORMAL HIGH (ref 6–23)
CO2: 24 mEq/L (ref 19–32)
Calcium: 8.8 mg/dL (ref 8.4–10.5)
Chloride: 98 mEq/L (ref 96–112)
Creatinine, Ser: 5.72 mg/dL — ABNORMAL HIGH (ref 0.50–1.35)
GFR calc Af Amer: 13 mL/min — ABNORMAL LOW (ref >90)
GFR calc non Af Amer: 11 mL/min — ABNORMAL LOW (ref >90)
Glucose, Bld: 98 mg/dL (ref 70–99)
Potassium: 2.7 mEq/L — CL (ref 3.5–5.1)
Sodium: 136 mEq/L (ref 135–145)

## 2013-04-19 LAB — POCT I-STAT TROPONIN I: Troponin i, poc: 0.06 ng/mL (ref 0.00–0.08)

## 2013-04-19 LAB — CBC
HCT: 30.8 % — ABNORMAL LOW (ref 39.0–52.0)
Hemoglobin: 10.2 g/dL — ABNORMAL LOW (ref 13.0–17.0)
MCH: 26.1 pg (ref 26.0–34.0)
MCHC: 33.1 g/dL (ref 30.0–36.0)
MCV: 78.8 fL (ref 78.0–100.0)
Platelets: 256 10*3/uL (ref 150–400)
RBC: 3.91 MIL/uL — ABNORMAL LOW (ref 4.22–5.81)
RDW: 17.1 % — ABNORMAL HIGH (ref 11.5–15.5)
WBC: 10.2 10*3/uL (ref 4.0–10.5)

## 2013-04-19 LAB — TROPONIN I: Troponin I: <0.3 ng/mL (ref <0.30)

## 2013-04-19 LAB — PRO B NATRIURETIC PEPTIDE: Pro B Natriuretic peptide (BNP): 34876 pg/mL — ABNORMAL HIGH (ref 0–125)

## 2013-04-19 IMAGING — CR DG CHEST 2V
2 series · 2 of 2 positions shown · non-contrast
Comparison: None.

CLINICAL DATA: Chest pain, headache, dizziness, cough, congestion.

EXAM:
CHEST  2 VIEW

[w chest pa]
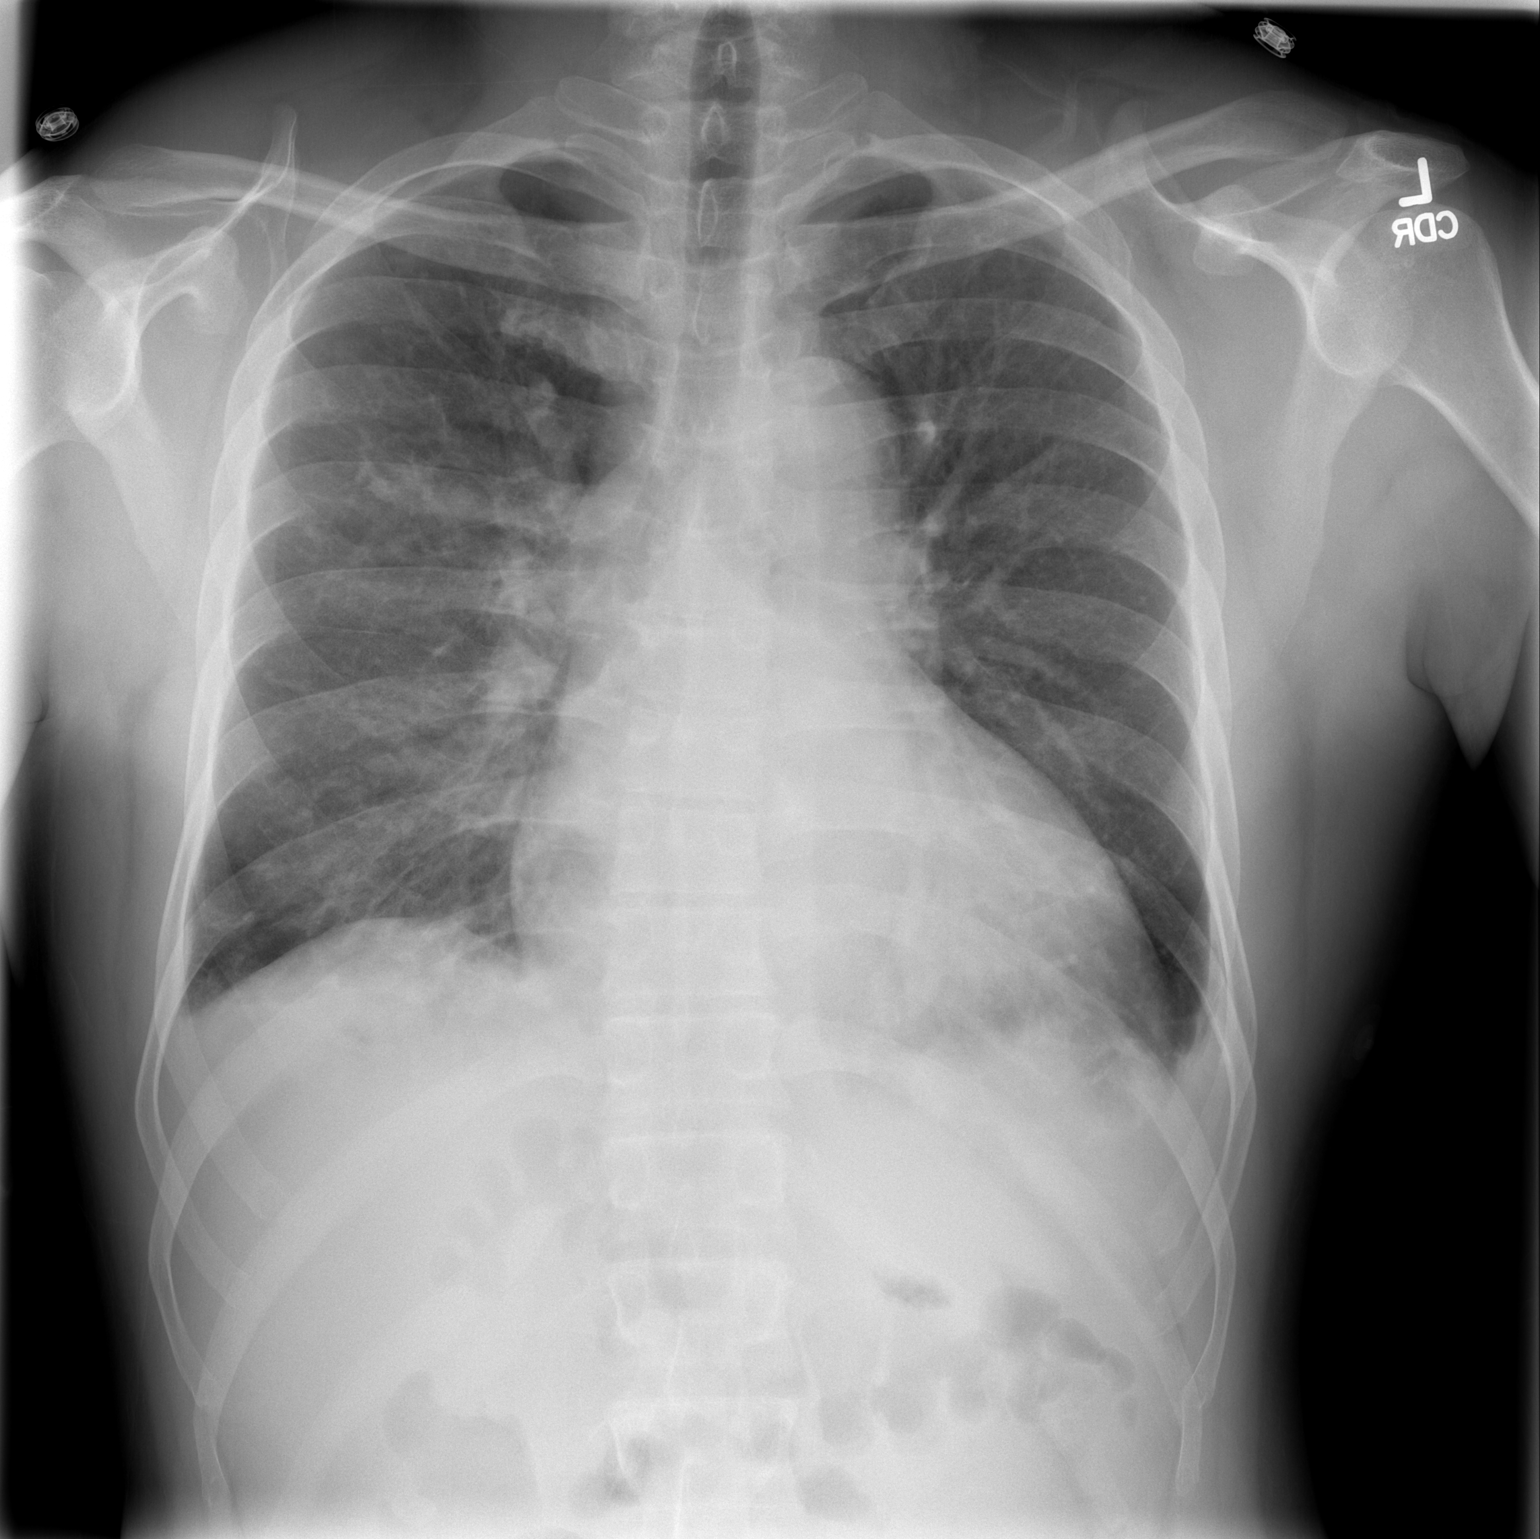

[w chest lat]
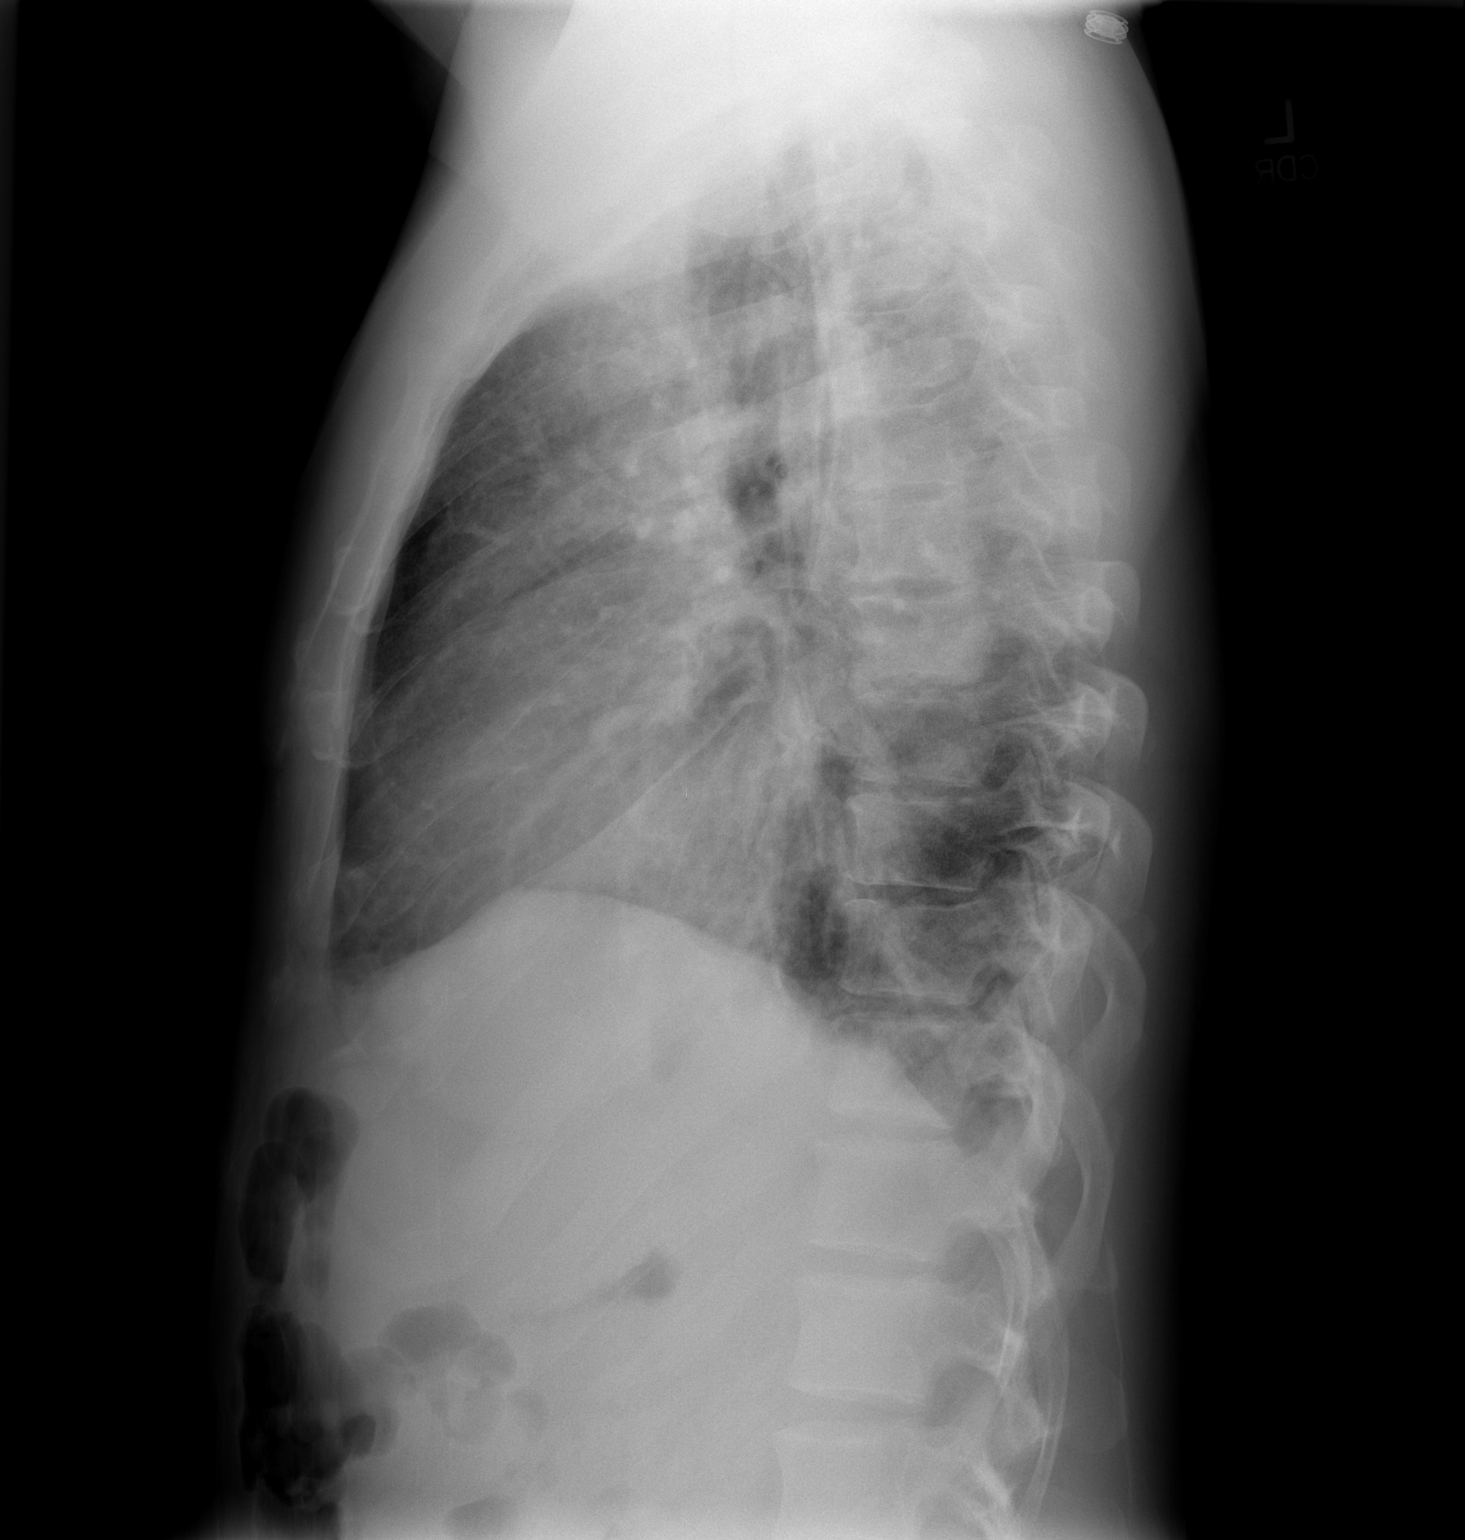

[2 of 2 positions shown; findings below may reference images not displayed]

FINDINGS: Cardiomegaly. Patchy airspace opacities within the lungs including
right upper lobe and both lung bases, left greater than right.
Cannot exclude pneumonia. Small left pleural effusion. No acute bony
abnormality.
IMPRESSION: Patchy bilateral airspace opacities as above, question pneumonia.

Small left effusion.

## 2013-04-19 MED ORDER — ONDANSETRON HCL 4 MG/2ML IJ SOLN
4.0000 mg | Freq: Once | INTRAMUSCULAR | Status: AC
  Administered 2013-04-19: 4 mg via INTRAVENOUS
  Filled 2013-04-19: qty 2

## 2013-04-19 MED ORDER — LABETALOL HCL 5 MG/ML IV SOLN
20.0000 mg | Freq: Once | INTRAVENOUS | Status: AC
  Administered 2013-04-19: 20 mg via INTRAVENOUS
  Filled 2013-04-19: qty 4

## 2013-04-19 MED ORDER — POTASSIUM CHLORIDE CRYS ER 20 MEQ PO TBCR: 40.0000 meq | EXTENDED_RELEASE_TABLET | ORAL | Status: DC

## 2013-04-19 MED ORDER — NITROPRUSSIDE SODIUM 25 MG/ML IV SOLN
0.2500 ug/kg/min | INTRAVENOUS | Status: DC
  Filled 2013-04-19: qty 2

## 2013-04-19 MED ORDER — HYDRALAZINE HCL 20 MG/ML IJ SOLN
10.0000 mg | Freq: Once | INTRAMUSCULAR | Status: AC
  Administered 2013-04-19: 10 mg via INTRAVENOUS
  Filled 2013-04-19: qty 1

## 2013-04-19 MED ORDER — POTASSIUM CHLORIDE CRYS ER 20 MEQ PO TBCR
40.0000 meq | EXTENDED_RELEASE_TABLET | ORAL | Status: DC
  Administered 2013-04-19: 40 meq via ORAL
  Filled 2013-04-19: qty 2

## 2013-04-19 MED ORDER — ASPIRIN EC 81 MG PO TBEC
81.0000 mg | DELAYED_RELEASE_TABLET | Freq: Every day | ORAL | Status: DC
  Administered 2013-04-20 – 2013-04-24 (×5): 81 mg via ORAL
  Filled 2013-04-19 (×5): qty 1

## 2013-04-19 MED ORDER — POTASSIUM CHLORIDE CRYS ER 20 MEQ PO TBCR
40.0000 meq | EXTENDED_RELEASE_TABLET | Freq: Once | ORAL | Status: DC
  Filled 2013-04-19: qty 2

## 2013-04-19 MED ORDER — FUROSEMIDE 10 MG/ML IJ SOLN
40.0000 mg | Freq: Once | INTRAMUSCULAR | Status: DC
  Filled 2013-04-19: qty 4

## 2013-04-19 MED ORDER — NITROGLYCERIN IN D5W 200-5 MCG/ML-% IV SOLN
2.0000 ug/min | INTRAVENOUS | Status: DC
  Administered 2013-04-19: 20 ug/min via INTRAVENOUS
  Administered 2013-04-20: 120 ug/min via INTRAVENOUS
  Filled 2013-04-19 (×2): qty 250

## 2013-04-19 MED ORDER — HYDRALAZINE HCL 20 MG/ML IJ SOLN: 5.0000 mg | INTRAMUSCULAR | Status: DC | PRN

## 2013-04-19 MED ORDER — FUROSEMIDE 10 MG/ML IJ SOLN
80.0000 mg | Freq: Once | INTRAMUSCULAR | Status: AC
  Administered 2013-04-19: 80 mg via INTRAVENOUS
  Filled 2013-04-19: qty 8

## 2013-04-19 MED ORDER — ASPIRIN 81 MG PO CHEW
324.0000 mg | CHEWABLE_TABLET | Freq: Once | ORAL | Status: AC
  Administered 2013-04-19: 324 mg via ORAL
  Filled 2013-04-19: qty 4

## 2013-04-19 MED ORDER — POTASSIUM CHLORIDE 10 MEQ/100ML IV SOLN
10.0000 meq | INTRAVENOUS | Status: DC
  Administered 2013-04-19: 10 meq via INTRAVENOUS
  Filled 2013-04-19: qty 100

## 2013-04-19 MED ORDER — MORPHINE SULFATE 2 MG/ML IJ SOLN
2.0000 mg | INTRAMUSCULAR | Status: DC | PRN
  Administered 2013-04-19 – 2013-04-20 (×5): 2 mg via INTRAVENOUS
  Filled 2013-04-19 (×5): qty 1

## 2013-04-19 MED ORDER — LABETALOL HCL 5 MG/ML IV SOLN: 40.0000 mg | Freq: Once | INTRAVENOUS | Status: DC

## 2013-04-19 MED ORDER — FUROSEMIDE 10 MG/ML IJ SOLN
120.0000 mg | Freq: Three times a day (TID) | INTRAVENOUS | Status: DC
  Filled 2013-04-19 (×2): qty 12

## 2013-04-19 NOTE — ED Provider Notes (Signed)
CSN: [id number]     Arrival date & time 04/19/13  1532 History   First MD Initiated Contact with Patient 04/19/13 1550     Chief Complaint  Patient presents with  . Hypertension  . Headache  . Shortness of Breath   (Consider location/radiation/quality/duration/timing/severity/associated sxs/prior Treatment) Patient is a 42 y.o. male presenting with shortness of breath.  Shortness of Breath Severity:  Severe Onset quality:  Gradual Timing:  Constant Progression:  Worsening Chronicity:  Recurrent Context: activity   Context comment:  Hx of CHF, renal insufficiency Relieved by:  Rest Worsened by:  Exertion (laying flat) Ineffective treatments: missed doses of diuretics.  Associated symptoms: chest pain   Associated symptoms: no fever and no sputum production     Past Medical History  Diagnosis Date  . Hypertension   . CHF (congestive heart failure)   . Renal disorder renal failure  . Cirrhosis of liver   . Drug addiction   . ETOH abuse   . Sleep apnea   . Hepatitis    Past Surgical History  Procedure Laterality Date  . Av fistula placement Right    No family history on file. History  Substance Use Topics  . Smoking status: Never Smoker   . Smokeless tobacco: Not on file  . Alcohol Use: Yes    Review of Systems  Constitutional: Negative for fever.  Respiratory: Positive for shortness of breath. Negative for sputum production.   Cardiovascular: Positive for chest pain.  All other systems reviewed and are negative.    Allergies  Review of patient's allergies indicates no known allergies.  Home Medications   Current Outpatient Rx  Name  Route  Sig  Dispense  Refill  . carvedilol (COREG) 25 MG tablet   Oral   Take 25 mg by mouth 2 (two) times daily with a meal.         . furosemide (LASIX) 40 MG tablet   Oral   Take 40 mg by mouth daily.         . hydrALAZINE (APRESOLINE) 100 MG tablet   Oral   Take 100 mg by mouth every 8 (eight) hours.         . Vitamin D, Ergocalciferol, (DRISDOL) 50000 UNITS CAPS capsule   Oral   Take 50,000 Units by mouth every 7 (seven) days. Takes on Tuesday          BP 246/148  Temp(Src) 98.8 F (37.1 C) (Oral)  Resp 24  Ht 6' (1.829 m)  Wt 170 lb (77.111 kg)  BMI 23.05 kg/m2  SpO2 95% Physical Exam  Nursing note and vitals reviewed. Constitutional: He is oriented to person, place, and time. He appears well-developed and well-nourished. No distress.  HENT:  Head: Normocephalic and atraumatic.  Mouth/Throat: Oropharynx is clear and moist.  Eyes: Conjunctivae are normal. Pupils are equal, round, and reactive to light. No scleral icterus.  Neck: Neck supple.  Cardiovascular: Normal rate, regular rhythm, normal heart sounds and intact distal pulses.   No murmur heard. Pulmonary/Chest: No stridor. Tachypnea noted. No respiratory distress. He has no wheezes. He has rales (Bibasilar).  Abdominal: Soft. He exhibits no distension. There is no tenderness.  Musculoskeletal: Normal range of motion. He exhibits edema (mild BLE ).  Neurological: He is alert and oriented to person, place, and time.  Skin: Skin is warm and dry. No rash noted.  Psychiatric: He has a normal mood and affect. His behavior is normal.    ED Course  CRITICAL CARE  Performed by: Serita Grit DAVID Authorized by: Serita Grit DAVID Total critical care time: 40 minutes Critical care time was exclusive of separately billable procedures and treating other patients. Critical care was necessary to treat or prevent imminent or life-threatening deterioration of the following conditions: cardiac failure, renal failure and circulatory failure. Critical care was time spent personally by me on the following activities: development of treatment plan with patient or surrogate, discussions with consultants, evaluation of patient's response to treatment, examination of patient, obtaining history from patient or surrogate, ordering and performing  treatments and interventions, ordering and review of laboratory studies, ordering and review of radiographic studies, pulse oximetry, re-evaluation of patient's condition and review of old charts.   (including critical care time) Labs Review Labs Reviewed  CBC - Abnormal; Notable for the following:    RBC 3.91 (*)    Hemoglobin 10.2 (*)    HCT 30.8 (*)    RDW 17.1 (*)    All other components within normal limits  BASIC METABOLIC PANEL - Abnormal; Notable for the following:    Potassium 2.7 (*)    BUN 49 (*)    Creatinine, Ser 5.72 (*)    GFR calc non Af Amer 11 (*)    GFR calc Af Amer 13 (*)    All other components within normal limits  PRO B NATRIURETIC PEPTIDE - Abnormal; Notable for the following:    Pro B Natriuretic peptide (BNP) 34876.0 (*)    All other components within normal limits  POCT I-STAT TROPONIN I   Imaging Review Dg Chest 2 View  04/19/2013   CLINICAL DATA:  Chest pain, headache, dizziness, cough, congestion.  EXAM: CHEST  2 VIEW  COMPARISON:  None.  FINDINGS: Cardiomegaly. Patchy airspace opacities within the lungs including right upper lobe and both lung bases, left greater than right. Cannot exclude pneumonia. Small left pleural effusion. No acute bony abnormality.  IMPRESSION: Patchy bilateral airspace opacities as above, question pneumonia.  Small left effusion.   Electronically Signed   By: Rolm Baptise M.D.   On: 04/19/2013 17:24  All radiology studies independently viewed by me.     EKG-  NSR, rate 99, normal axis, normal intervals, nonspecific t wave changes, no priors available.  EKG 2 - NSR, rate 93, normal axis, normal intervals, nonspecific ST/T changes which are more pronounced compared to prior.    MDM   1. Hypertensive emergency   2. Acute CHF   3. Renal failure    SOB in setting of hx of CHF and medication noncompliance.    Lab work showed significant renal failure. BNP highly elevated. I think his chest x-ray is consistent with pulmonary  edema. He has no fevers or productive cough to suggest pneumonia. Given multiple doses of IV antihypertensives with difficulty adequately controlling his blood pressures. Consulted internal medicine for admission to the step down unit. Also discussed case with Dr. Melvia Heaps with nephrology as he may need dialysis.    Houston Siren, MD 04/20/13 [phone]

## 2013-04-19 NOTE — ED Notes (Signed)
Critical Potassium 2.7. MD Notified.

## 2013-04-19 NOTE — H&P (Signed)
Date: 04/19/2013               Patient Name:  Patrick Anthony MRN: [id number]  DOB: 04-04-1971 Age / Sex: 42 y.o., male   PCP: No Pcp Per Patient         Medical Service: Internal Medicine Teaching Service         Attending Physician: Dr. Houston Siren, MD    First Contact: Dr. Rebecca Eaton Pager: [phone]  Second Contact: Dr. Clayburn Pert Pager: [phone]       After Hours (After 5p/  First Contact Pager: [phone]  weekends / holidays): Second Contact Pager: [phone]   Chief Complaint: Shortness of breath and chest pain  History of Present Illness: Patrick Anthony is a 42 year old male with a PMH of HTN, diastolic HF (EF 123456), HCV, cirrhosis, CKD4 and OSA who moved to [place] from [place] a few weeks ago.  He presents to the ED after experiencing SOB and non-radiating pleuritic CP for the past 4 days.  The pain is 7/10 and occurs with inspiration.  He says he has not slept in 4 days secondary to dyspnea.  Patrick Anthony also noticed lower extremity edema for the past few weeks.  He denies calf pain.  He has a cough that started today.  He reports missing two days of Lasix earlier this week and has only been taking it once daily (as opposed to BID) for the past 4 days.  Sitting up improves his dyspnea and CP.  He does not own a scale and has not measured his weight.    Patrick Anthony BP was elevated to 210s-240s/120-150s in the ED.  His home medications are Coreg 25mg  BID, Lasix 40mg  BID, hydralazine 100mg  q8h, clonidine 0.05mg  TID.  He ran out of clonidine and Coreg a few days ago.  He complains of headache but denies change in vision.   He denies current drug or alcohol use.   In the ED:  nitroglycerin drip was started and Patrick Anthony was transferred to cardiac ICU for titration  Meds: Current Facility-Administered Medications  Medication Dose Route Frequency Provider Last Rate Last Dose  . furosemide (LASIX) injection 80 mg  80 mg Intravenous Once Ivor Costa, MD      . potassium  chloride SA (K-DUR,KLOR-CON) CR tablet 40 mEq  40 mEq Oral Q4H Ivor Costa, MD   40 mEq at 04/19/13 2012   Current Outpatient Prescriptions  Medication Sig Dispense Refill  . carvedilol (COREG) 25 MG tablet Take 25 mg by mouth 2 (two) times daily with a meal.      . furosemide (LASIX) 40 MG tablet Take 40 mg by mouth daily.      . hydrALAZINE (APRESOLINE) 100 MG tablet Take 100 mg by mouth every 8 (eight) hours.      . Vitamin D, Ergocalciferol, (DRISDOL) 50000 UNITS CAPS capsule Take 50,000 Units by mouth every 7 (seven) days. Takes on Tuesday        Allergies: Allergies as of 04/19/2013  . (No Known Allergies)   Past Medical History  Diagnosis Date  . Hypertension   . CHF (congestive heart failure)   . Renal disorder renal failure  . Cirrhosis of liver   . Drug addiction   . ETOH abuse   . Sleep apnea   . Hepatitis    Past Surgical History  Procedure Laterality Date  . Av fistula placement Right    No family history on file. History   Social History  .  Marital Status: Single    Spouse Name: N/A    Number of Children: N/A  . Years of Education: N/A   Occupational History  . Not on file.   Social History Main Topics  . Smoking status: Never Smoker   . Smokeless tobacco: Not on file  . Alcohol Use: Yes  . Drug Use: No     Comment: Quit  . Sexual Activity: Not on file   Other Topics Concern  . Not on file   Social History Narrative  . No narrative on file    Review of Systems: Pertinent items are noted in HPI. Cardiac: as above Pulmonary: as above GI:  Good appetite, no change in bowel habits GU:  Still makes urine, no decrease in urine Neuro:  No weakness, numbness/tingling, no vision loss/change, +headache  Physical Exam: Blood pressure 209/140, pulse 82, temperature 98.8 F (37.1 C), temperature source Oral, resp. rate 23, height 6' (1.829 m), weight 77.111 kg (170 lb), SpO2 99.00%. General: resting in bed in NAD  HEENT: PERRL, EOMI, no scleral  icterus, no cervical lymphadenopathy Cardiac: +S1, +S2, RRR, no rubs, murmurs or gallops, no JVD Pulm: globally decreased BS, moving normal volumes of air Abd: soft, nondistended, BS present, +RUQ TTP Ext: +2 pitting edema RLE; trace edema LLE; non-tender bilaterally, +2DP pulses Neuro: alert and oriented X3, cranial nerves II-XII grossly intact, 5/5 MMS B/L  Lab results: Basic Metabolic Panel:  Recent Labs  04/19/13 1630  NA 136  K 2.7*  CL 98  CO2 24  GLUCOSE 98  BUN 49*  CREATININE 5.72*  CALCIUM 8.8   CBC:  Recent Labs  04/19/13 1630  WBC 10.2  HGB 10.2*  HCT 30.8*  MCV 78.8  PLT 256   BNP:  Recent Labs  04/19/13 1633  PROBNP 34876.0*    Imaging results:  Dg Chest 2 View  04/19/2013   CLINICAL DATA:  Chest pain, headache, dizziness, cough, congestion.  EXAM: CHEST  2 VIEW  COMPARISON:  None.  FINDINGS: Cardiomegaly. Patchy airspace opacities within the lungs including right upper lobe and both lung bases, left greater than right. Cannot exclude pneumonia. Small left pleural effusion. No acute bony abnormality.  IMPRESSION: Patchy bilateral airspace opacities as above, question pneumonia.  Small left effusion.   Electronically Signed   By: Rolm Baptise M.D.   On: 04/19/2013 17:24    Assessment & Plan by Problem: #Hypertensive emergency - SBP 210-240s/140-150s in the ED.  Patient dyspneic likely 2/2 to pulmonary edema and with chest pain. - Admit to IMTS to Stepdown  - Start nitroglycerin drip (0.2mg /mL in D5) and titrate to keep DBP 105-132mmHg  - Continue Coreg 12.5mg  po BID, Hydralazine 100mg  po TID - Hold Clonidine  #Acute decompensated heart failure -  Patient with complaint of dyspnea improved by sitting up, chest pain with inspiration, feeling of abdominal distention and lower extremity edema concerning for volume overload.  He missed a few days of Lasix and has taken only once daily for the past 4 days.  Pro-BNP is elevated but difficult to interpret in  setting of CKD.  Patient with 1800 urine output after 1 dose of 80mg  Lasix in ED. - IV Lasix 80mg  BID beginning tomorrow AM - Insert foley, strict I/O - Daily weights - 2D ECHO  #Chest pain - Pleuritic, non-radiating, alleviated by sitting up.  Likely related to fluid overload.  Differential includes uremic pericarditis vs PNA vs PE vs ACS.  ACS less likely given atypical chest pain  of 4 days duration and no ischemic change on EKG.  PNA less likely given patient afebrile and without leukocytosis, however he does have cough and a CXR with patchy opacities.  Wells score 3 for R>L leg edema.  PE unlikely given Wells < 4, absence of tachypnea, tachycardia.  His heart, kidney and liver disease will make D-dimer difficult to interpret.  Will obtain RLE venous duplex.  Uremic pericarditis is a possibility given his renal failure.   - ASA 324mg  daily - IV morphine 2mg  q3h prn - SL nitro 0.4mg  q5 x3 prn - 2D ECHO - Trend CE - AM EKG  #CKD G5 - GFR 13, Cr 5.72, unknown baseline.  Patient followed by nephrologist in [place].  RUE AV fistula placed in January 2014 but became clotted and was ligated in March 2014.  Per patient, the plan was for a new AV fistula to be placed.   - Nephrology consulted and recommendations appreciated  #Hypokalemia - unclear etiology.  With patient's CKD and recent non-compliance with Lasix would expect elevated K.   - 40 mEq Kdur po q4h x 2 doses - AM BMP  #RLE edema - Patient with unilateral swelling of RLE.  He drove down from [place] but this was a 3 hour drive that occurred 1 month ago.  His leg is non-tender.  Wells 3.  This is likely volume overload but given pleuritic CP and unilateral swelling will get RLE venous duplex.   - RLE venous duplex  #DVT ppx -  SQ heparin  Dispo: Disposition is deferred at this time, awaiting improvement of current medical problems. Anticipated discharge in approximately 1-2 day(s).   The patient does not have a current PCP (No Pcp  Per Patient) and does need an [facility] hospital follow-up appointment after discharge.  The patient does not have transportation limitations that hinder transportation to clinic appointments.  Signed: Duwaine Maxin, DO 04/19/2013, 8:14 PM

## 2013-04-19 NOTE — ED Notes (Signed)
MD at bedside. 

## 2013-04-19 NOTE — ED Notes (Signed)
Pt c/o shortness of breath, headache, and hypertension x 3 days. Pt has missed a few doses of his BP meds due to ran out of them. Pt from out of town. Pt c/o chest pain with nausea.

## 2013-04-19 NOTE — ED Notes (Signed)
Pt returned from radiology.

## 2013-04-19 NOTE — ED Notes (Signed)
Pt reports continued 8/10 central CP. Primary RN made aware.

## 2013-04-19 NOTE — ED Notes (Signed)
Pt started to have chest pain. EKG shot and handed to MD.

## 2013-04-20 ENCOUNTER — Encounter (HOSPITAL_COMMUNITY): Payer: Self-pay | Admitting: *Deleted

## 2013-04-20 ENCOUNTER — Inpatient Hospital Stay (HOSPITAL_COMMUNITY): Payer: Medicaid - Out of State

## 2013-04-20 DIAGNOSIS — I161 Hypertensive emergency: Secondary | ICD-10-CM | POA: Diagnosis present

## 2013-04-20 DIAGNOSIS — I369 Nonrheumatic tricuspid valve disorder, unspecified: Secondary | ICD-10-CM

## 2013-04-20 DIAGNOSIS — I10 Essential (primary) hypertension: Secondary | ICD-10-CM

## 2013-04-20 LAB — TROPONIN I
Troponin I: <0.3 ng/mL (ref <0.30)
Troponin I: <0.3 ng/mL (ref <0.30)

## 2013-04-20 LAB — BASIC METABOLIC PANEL
BUN: 50 mg/dL — ABNORMAL HIGH (ref 6–23)
BUN: 49 mg/dL — ABNORMAL HIGH (ref 6–23)
CO2: 24 mEq/L (ref 19–32)
CO2: 27 mEq/L (ref 19–32)
Calcium: 8.5 mg/dL (ref 8.4–10.5)
Calcium: 8.7 mg/dL (ref 8.4–10.5)
Chloride: 99 mEq/L (ref 96–112)
Chloride: 98 mEq/L (ref 96–112)
Creatinine, Ser: 6.06 mg/dL — ABNORMAL HIGH (ref 0.50–1.35)
Creatinine, Ser: 6.19 mg/dL — ABNORMAL HIGH (ref 0.50–1.35)
GFR calc Af Amer: 12 mL/min — ABNORMAL LOW (ref >90)
GFR calc Af Amer: 12 mL/min — ABNORMAL LOW (ref >90)
GFR calc non Af Amer: 10 mL/min — ABNORMAL LOW (ref >90)
GFR calc non Af Amer: 10 mL/min — ABNORMAL LOW (ref >90)
Glucose, Bld: 118 mg/dL — ABNORMAL HIGH (ref 70–99)
Glucose, Bld: 148 mg/dL — ABNORMAL HIGH (ref 70–99)
Potassium: 3.2 mEq/L — ABNORMAL LOW (ref 3.5–5.1)
Potassium: 3.5 mEq/L (ref 3.5–5.1)
Sodium: 136 mEq/L (ref 135–145)
Sodium: 136 mEq/L (ref 135–145)

## 2013-04-20 LAB — RAPID URINE DRUG SCREEN, HOSP PERFORMED
Amphetamines: NOT DETECTED
Barbiturates: NOT DETECTED
Benzodiazepines: NOT DETECTED
Cocaine: NOT DETECTED
Opiates: POSITIVE — AB
Tetrahydrocannabinol: NOT DETECTED

## 2013-04-20 LAB — OSMOLALITY, URINE: Osmolality, Ur: 308 mOsm/kg — ABNORMAL LOW (ref 390–1090)

## 2013-04-20 LAB — BLOOD GAS, ARTERIAL
Acid-Base Excess: 2.3 mmol/L — ABNORMAL HIGH (ref 0.0–2.0)
Bicarbonate: 26.3 mEq/L — ABNORMAL HIGH (ref 20.0–24.0)
FIO2: 0.21 %
O2 Saturation: 100 %
Patient temperature: 98.6
TCO2: 27.6 mmol/L (ref 0–100)
pCO2 arterial: 41 mmHg (ref 35.0–45.0)
pH, Arterial: 7.424 (ref 7.350–7.450)
pO2, Arterial: 148 mmHg — ABNORMAL HIGH (ref 80.0–100.0)

## 2013-04-20 LAB — URINALYSIS, ROUTINE W REFLEX MICROSCOPIC
Bilirubin Urine: NEGATIVE
Glucose, UA: 100 mg/dL — AB
Hgb urine dipstick: NEGATIVE
Ketones, ur: NEGATIVE mg/dL
Leukocytes, UA: NEGATIVE
Nitrite: NEGATIVE
Protein, ur: 100 mg/dL — AB
Specific Gravity, Urine: 1.009 (ref 1.005–1.030)
Urobilinogen, UA: 0.2 mg/dL (ref 0.0–1.0)
pH: 5.5 (ref 5.0–8.0)

## 2013-04-20 LAB — URINE MICROSCOPIC-ADD ON

## 2013-04-20 LAB — NA AND K (SODIUM & POTASSIUM), RAND UR
Potassium Urine: 33 mEq/L
Sodium, Ur: 75 mEq/L

## 2013-04-20 LAB — MRSA PCR SCREENING: MRSA by PCR: NEGATIVE

## 2013-04-20 LAB — MAGNESIUM: Magnesium: 2 mg/dL (ref 1.5–2.5)

## 2013-04-20 LAB — OSMOLALITY: Osmolality: 303 mOsm/kg — ABNORMAL HIGH (ref 275–300)

## 2013-04-20 LAB — PROTIME-INR
INR: 1.08 (ref 0.00–1.49)
Prothrombin Time: 13.8 seconds (ref 11.6–15.2)

## 2013-04-20 IMAGING — US US AORTA
1 series · 14 of 25 positions shown · non-contrast
Comparison: None.

CLINICAL DATA: Evaluate for aortic dissection

ULTRASOUND OF ABDOMINAL AORTA
TECHNIQUE: Ultrasound examination of the abdominal aorta was
performed to evaluate for abdominal aortic aneurysm.

[Series 1: us aorta · 0.25mm/px · 14 of 28 slices shown]
[im 1/28]
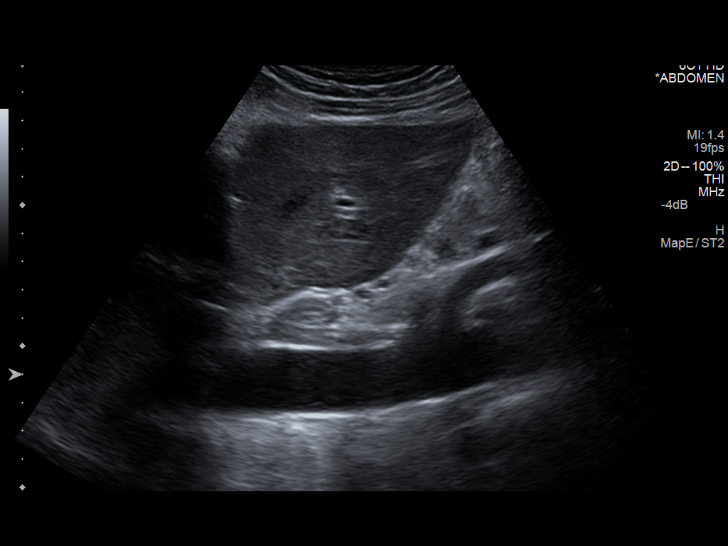
[im 3/28]
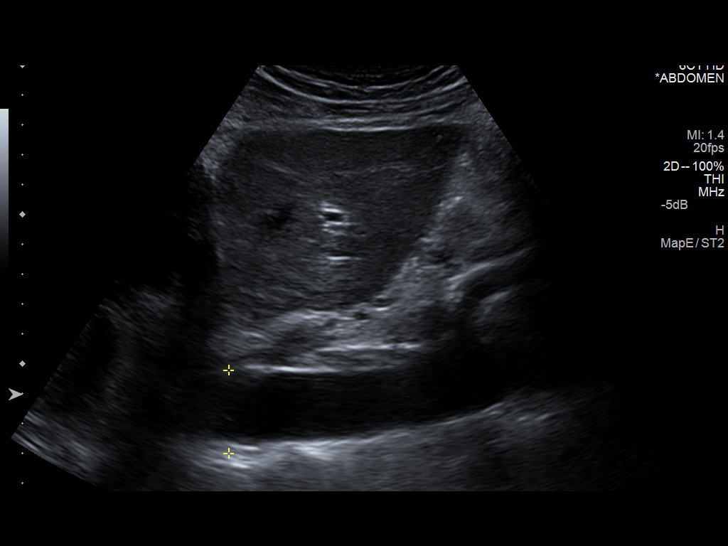
[im 5/28]
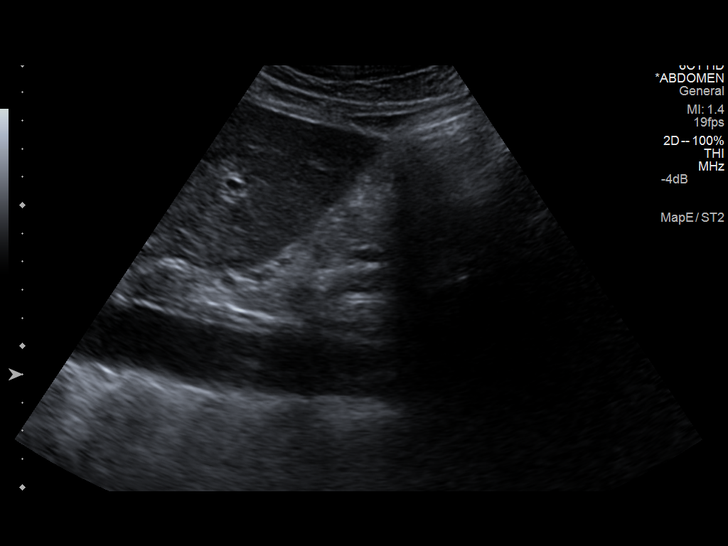
[im 7/28]
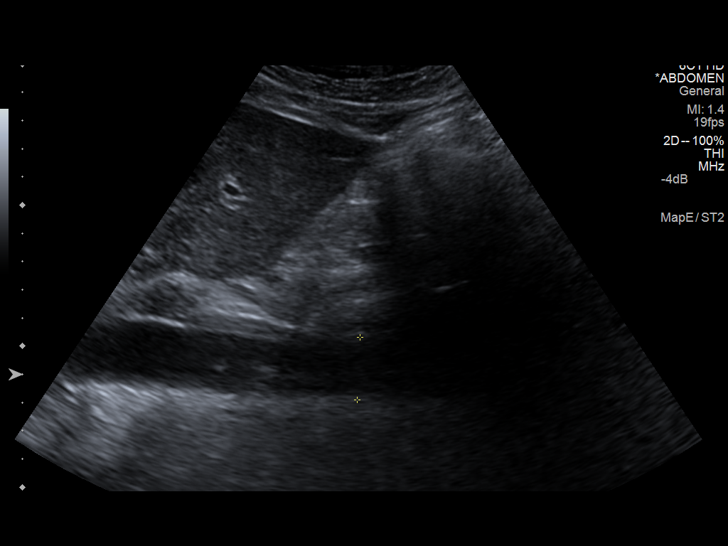
[im 10/28]
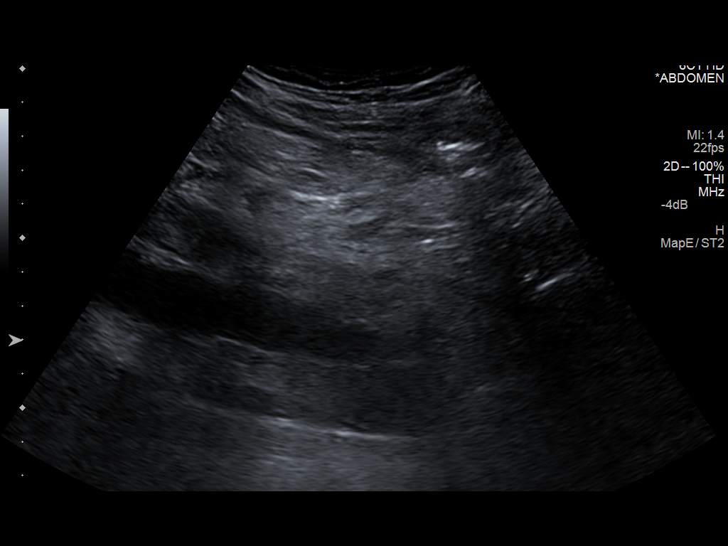
[im 11/28]
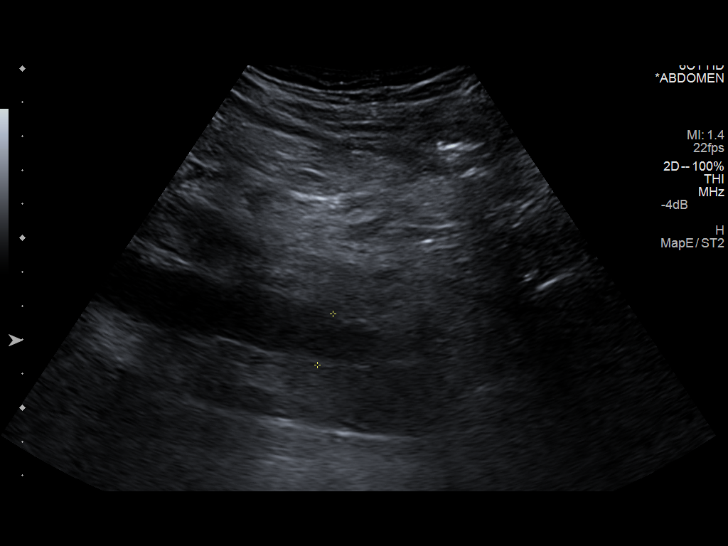
[im 13/28]
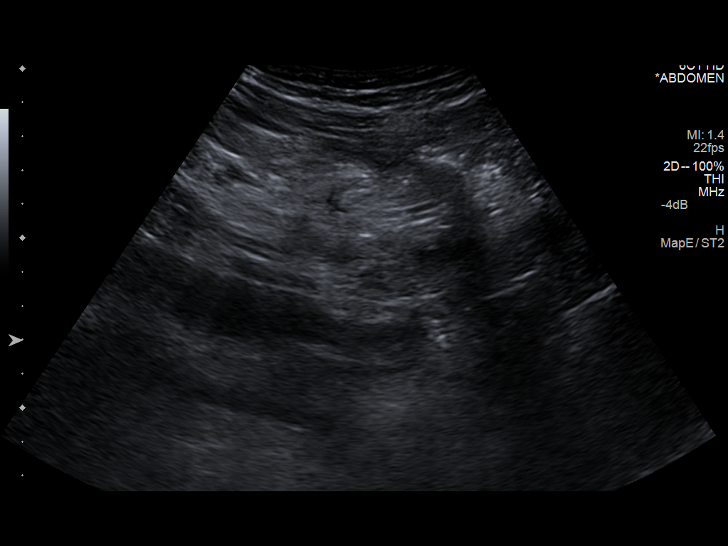
[im 15/28]
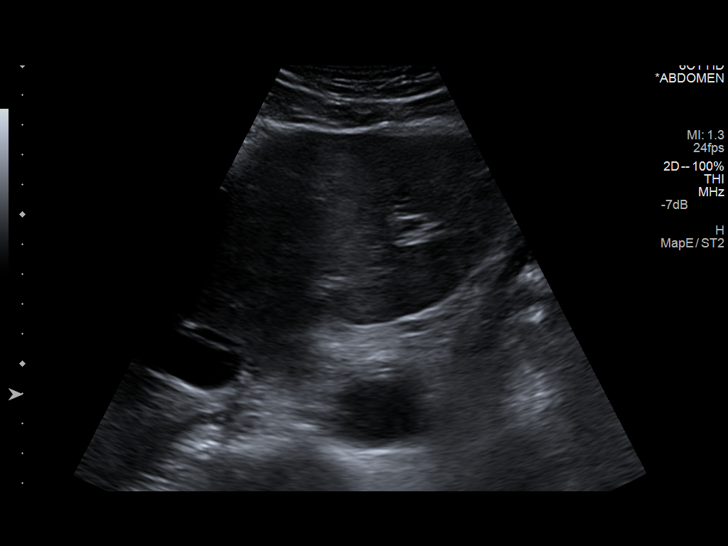
[im 17/28]
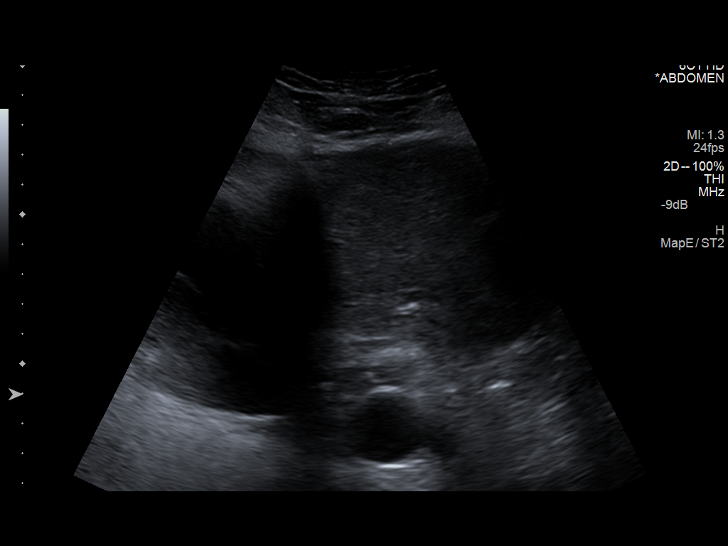
[im 19/28]
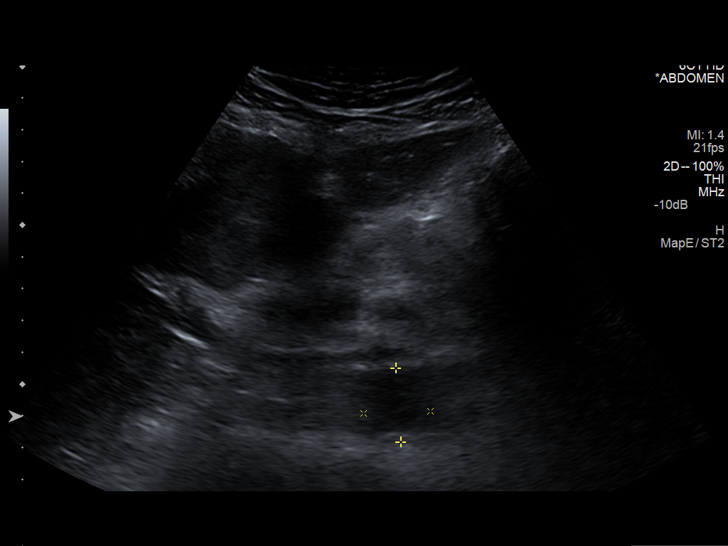
[im 21/28]
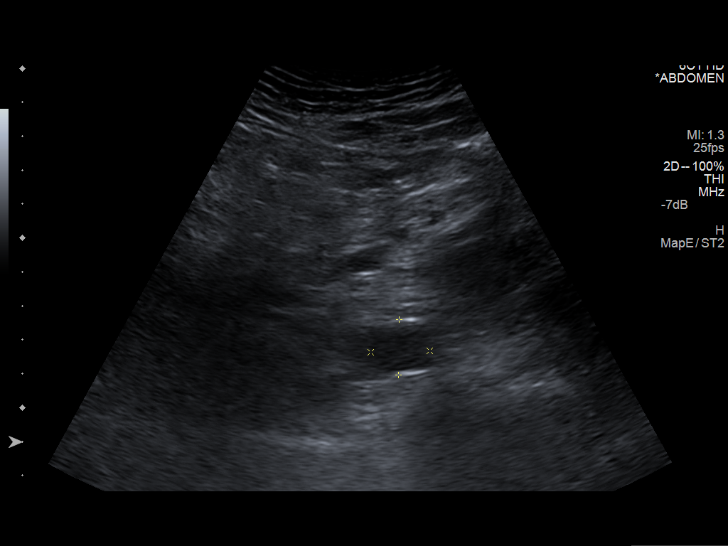
[im 23/28]
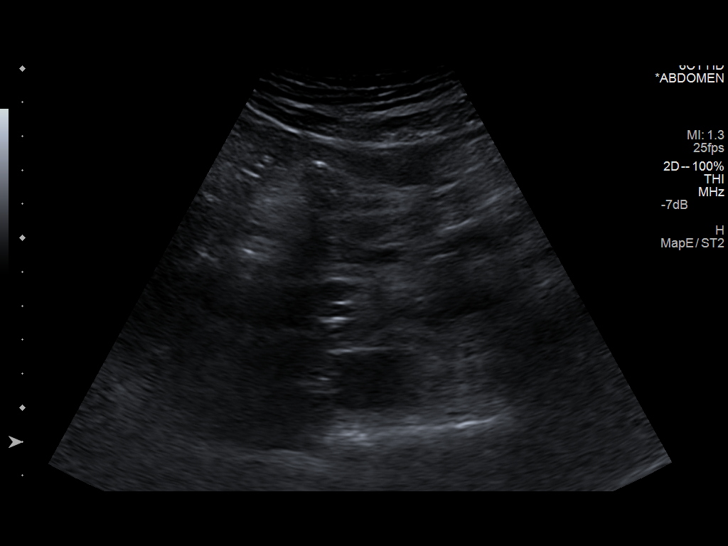
[im 25/28]
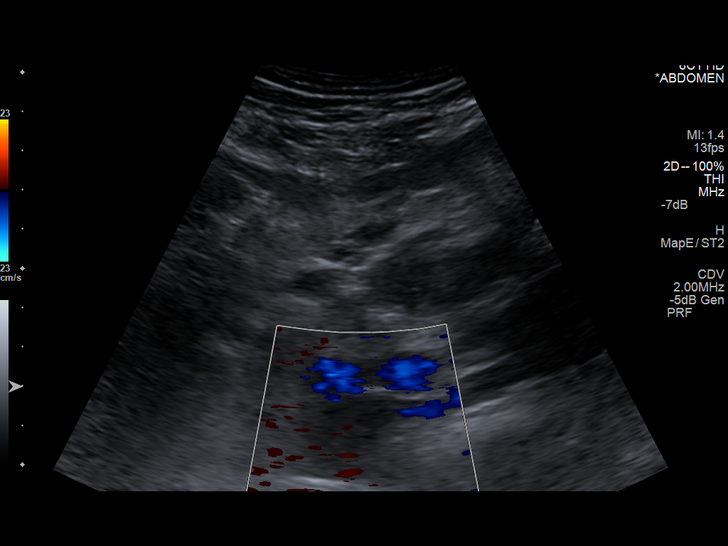
[im 28/28]
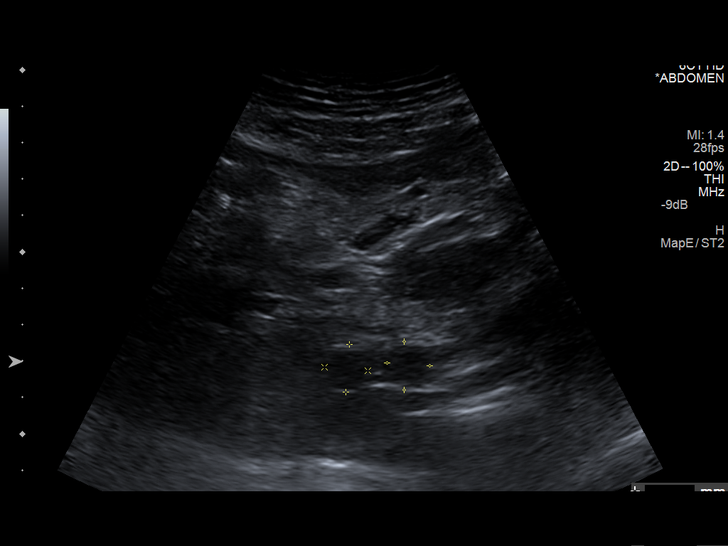

[14 of 25 positions shown; findings below may reference images not displayed]

Abdominal Aorta:  The aorta is well seen sonographically.  Only a
small portion of the mid to distal aorta is obscured by overlying
bowel gas.  The aorta is within normal limits in caliber.  No
dissection flap identified.

      Maximum AP diameter:  2.8 cm proximally
      Maximum TRV diameter:  2.5 cm proximally

Right common iliac artery maximal diameter:  1.3 cm
Left common iliac artery maximal diameter:  1.3 cm
IMPRESSION: Mildly ectatic but otherwise unremarkable abdominal aorta without
aneurysmal dilatation or visualized dissection flap.

## 2013-04-20 MED ORDER — HYDRALAZINE HCL 20 MG/ML IJ SOLN: 20.0000 mg | INTRAMUSCULAR | Status: DC | PRN

## 2013-04-20 MED ORDER — FUROSEMIDE 10 MG/ML IJ SOLN: 80.0000 mg | Freq: Two times a day (BID) | INTRAMUSCULAR | Status: DC

## 2013-04-20 MED ORDER — FUROSEMIDE 10 MG/ML IJ SOLN
80.0000 mg | Freq: Two times a day (BID) | INTRAMUSCULAR | Status: DC
  Administered 2013-04-20: 80 mg via INTRAVENOUS
  Filled 2013-04-20 (×3): qty 8

## 2013-04-20 MED ORDER — MORPHINE SULFATE 2 MG/ML IJ SOLN
2.0000 mg | INTRAMUSCULAR | Status: DC | PRN
  Administered 2013-04-20 – 2013-04-21 (×5): 2 mg via INTRAVENOUS
  Filled 2013-04-20 (×4): qty 1

## 2013-04-20 MED ORDER — HEPARIN SODIUM (PORCINE) 5000 UNIT/ML IJ SOLN
5000.0000 [IU] | Freq: Three times a day (TID) | INTRAMUSCULAR | Status: DC
  Administered 2013-04-20 – 2013-04-23 (×11): 5000 [IU] via SUBCUTANEOUS
  Filled 2013-04-20 (×16): qty 1

## 2013-04-20 MED ORDER — AMLODIPINE BESYLATE 5 MG PO TABS: 5.0000 mg | ORAL_TABLET | Freq: Two times a day (BID) | ORAL | Status: DC

## 2013-04-20 MED ORDER — HYDRALAZINE HCL 50 MG PO TABS
100.0000 mg | ORAL_TABLET | Freq: Three times a day (TID) | ORAL | Status: DC
  Administered 2013-04-20 (×2): 100 mg via ORAL
  Filled 2013-04-20 (×5): qty 2

## 2013-04-20 MED ORDER — DEXTROSE 5 % IV SOLN
0.2500 ug/kg/min | INTRAVENOUS | Status: DC
  Administered 2013-04-20: 0.5 ug/kg/min via INTRAVENOUS
  Filled 2013-04-20 (×3): qty 2

## 2013-04-20 MED ORDER — ACETAMINOPHEN 650 MG RE SUPP: 650.0000 mg | Freq: Four times a day (QID) | RECTAL | Status: DC | PRN

## 2013-04-20 MED ORDER — CARVEDILOL 25 MG PO TABS
25.0000 mg | ORAL_TABLET | Freq: Two times a day (BID) | ORAL | Status: DC
  Administered 2013-04-20 – 2013-04-24 (×8): 25 mg via ORAL
  Filled 2013-04-20 (×10): qty 1

## 2013-04-20 MED ORDER — SODIUM CHLORIDE 0.9 % IV SOLN: 250.0000 mL | INTRAVENOUS | Status: DC | PRN

## 2013-04-20 MED ORDER — AMLODIPINE BESYLATE 10 MG PO TABS
10.0000 mg | ORAL_TABLET | Freq: Two times a day (BID) | ORAL | Status: DC
  Administered 2013-04-20 – 2013-04-23 (×6): 10 mg via ORAL
  Filled 2013-04-20 (×8): qty 1

## 2013-04-20 MED ORDER — POTASSIUM CHLORIDE CRYS ER 20 MEQ PO TBCR
40.0000 meq | EXTENDED_RELEASE_TABLET | Freq: Once | ORAL | Status: AC
  Administered 2013-04-20: 40 meq via ORAL
  Filled 2013-04-20: qty 2

## 2013-04-20 MED ORDER — FUROSEMIDE 10 MG/ML IJ SOLN
60.0000 mg | Freq: Two times a day (BID) | INTRAMUSCULAR | Status: DC
  Administered 2013-04-20: 60 mg via INTRAVENOUS
  Filled 2013-04-20 (×2): qty 6

## 2013-04-20 MED ORDER — NITROGLYCERIN 0.4 MG SL SUBL
0.4000 mg | SUBLINGUAL_TABLET | SUBLINGUAL | Status: DC | PRN
  Administered 2013-04-20: 0.4 mg via SUBLINGUAL
  Filled 2013-04-20: qty 25

## 2013-04-20 MED ORDER — SODIUM CHLORIDE 0.9 % IJ SOLN: 3.0000 mL | INTRAMUSCULAR | Status: DC | PRN

## 2013-04-20 MED ORDER — HYDRALAZINE HCL 20 MG/ML IJ SOLN
INTRAMUSCULAR | Status: AC
  Filled 2013-04-20: qty 1

## 2013-04-20 MED ORDER — LABETALOL HCL 5 MG/ML IV SOLN
10.0000 mg | INTRAVENOUS | Status: DC | PRN
  Administered 2013-04-20: 30 mg via INTRAVENOUS
  Filled 2013-04-20 (×2): qty 8

## 2013-04-20 MED ORDER — CLONIDINE HCL 0.1 MG PO TABS
0.1000 mg | ORAL_TABLET | Freq: Three times a day (TID) | ORAL | Status: DC
  Filled 2013-04-20 (×3): qty 1

## 2013-04-20 MED ORDER — CLONIDINE HCL 0.2 MG PO TABS: 0.2000 mg | ORAL_TABLET | Freq: Two times a day (BID) | ORAL | Status: DC

## 2013-04-20 MED ORDER — SODIUM CHLORIDE 0.9 % IJ SOLN
3.0000 mL | Freq: Two times a day (BID) | INTRAMUSCULAR | Status: DC
  Administered 2013-04-20 – 2013-04-24 (×7): 3 mL via INTRAVENOUS

## 2013-04-20 MED ORDER — VITAMIN D (ERGOCALCIFEROL) 1.25 MG (50000 UNIT) PO CAPS
50000.0000 [IU] | ORAL_CAPSULE | ORAL | Status: DC
  Administered 2013-04-22: 50000 [IU] via ORAL
  Filled 2013-04-20: qty 1

## 2013-04-20 MED ORDER — HYDRALAZINE HCL 50 MG PO TABS
100.0000 mg | ORAL_TABLET | Freq: Three times a day (TID) | ORAL | Status: DC
  Administered 2013-04-20 – 2013-04-24 (×12): 100 mg via ORAL
  Filled 2013-04-20 (×15): qty 2

## 2013-04-20 MED ORDER — LABETALOL HCL 5 MG/ML IV SOLN
10.0000 mg | INTRAVENOUS | Status: DC | PRN
  Administered 2013-04-22: 20 mg via INTRAVENOUS
  Filled 2013-04-20 (×2): qty 4

## 2013-04-20 MED ORDER — DEXTROSE 5 % IV SOLN
0.2500 ug/kg/min | INTRAVENOUS | Status: AC
  Administered 2013-04-20: 5 ug/kg/min via INTRAVENOUS
  Filled 2013-04-20 (×3): qty 4

## 2013-04-20 MED ORDER — LABETALOL HCL 5 MG/ML IV SOLN
10.0000 mg | INTRAVENOUS | Status: DC | PRN
Start: 2013-04-20 — End: 2013-04-20
  Administered 2013-04-20: 20 mg via INTRAVENOUS
  Administered 2013-04-20: 40 mg via INTRAVENOUS
  Administered 2013-04-20: 20 mg via INTRAVENOUS
  Filled 2013-04-20: qty 8
  Filled 2013-04-20: qty 4

## 2013-04-20 MED ORDER — PANTOPRAZOLE SODIUM 40 MG IV SOLR
40.0000 mg | INTRAVENOUS | Status: DC
  Administered 2013-04-20: 40 mg via INTRAVENOUS
  Filled 2013-04-20 (×2): qty 40

## 2013-04-20 MED ORDER — CARVEDILOL 12.5 MG PO TABS
12.5000 mg | ORAL_TABLET | Freq: Two times a day (BID) | ORAL | Status: DC
  Administered 2013-04-20: 12.5 mg via ORAL
  Filled 2013-04-20 (×3): qty 1

## 2013-04-20 MED ORDER — CLONIDINE HCL 0.1 MG PO TABS
0.1000 mg | ORAL_TABLET | Freq: Three times a day (TID) | ORAL | Status: DC
  Administered 2013-04-20 (×2): 0.1 mg via ORAL
  Filled 2013-04-20 (×5): qty 1

## 2013-04-20 MED ORDER — ACETAMINOPHEN 325 MG PO TABS
650.0000 mg | ORAL_TABLET | Freq: Four times a day (QID) | ORAL | Status: DC | PRN
  Administered 2013-04-20: 650 mg via ORAL
  Filled 2013-04-20 (×2): qty 2

## 2013-04-20 NOTE — Progress Notes (Signed)
Discussed with IMTS resident. Pt evaluated and admission orders written by resident. Plan was for SDU admission but Renal service recommended titrated NTG infusion which precluded SDU admission. Pt will be admitted to ICU and reassessed in AM by IMTS and PCCM teams. If it appears that he will not be titrated of the continuous meds over the course of the day, PCCM will assume primary service duties while in ICU   Merton Border, MD ; [facility] [phone].  After 5:30 PM or weekends, call [phone]

## 2013-04-20 NOTE — Procedures (Signed)
I was present for the entire procedure. No immediate complications. Arterial line with adequate tracing to use.  Dominic Pea, DO, [facility] 04/20/2013, 3:20 PM

## 2013-04-20 NOTE — Procedures (Signed)
Arterial Catheter Insertion Procedure Note Patrick Anthony [id number] 1970-12-13  Procedure: Insertion of Arterial Catheter  Indications: Blood pressure monitoring  Procedure Details Consent: Risks of procedure as well as the alternatives and risks of each were explained to the (patient/caregiver).  Consent for procedure obtained. Time Out: Verified patient identification, verified procedure, site/side was marked, verified correct patient position, special equipment/implants available, medications/allergies/relevent history reviewed, required imaging and test results available.  Performed  Maximum sterile technique was used including antiseptics, gloves, hand hygiene and sheet. Skin prep: Chlorhexidine; local anesthetic administered 20 gauge catheter was inserted into left radial artery using the Seldinger technique.  Evaluation Blood flow good; BP tracing good. Complications: No apparent complications.   Patrick Anthony 04/20/2013

## 2013-04-20 NOTE — H&P (Signed)
INTERNAL MEDICINE TEACHING SERVICE Attending Admission Note  Date: 04/20/2013  Patient name: Patrick Anthony  Medical record number: [id number]  Date of birth: 09/07/1970    I have seen and evaluated Melodye Ped and discussed their care with the Residency Team.    42 yr old AAM w/ pmhx significant for HTN, HFpEF, HCV, cirrhosis, CKD stage ? (baseline Cr unknown), recently moved here from [place], presented with SOB and CP.  He states he has been having progressively worse SOB in the past few days associated with orthopnea as well.  He admits to increased LE edema. He states his CP is worse with inspiration, but had changed to a dull constant pain.  He has missed some doses of his medications, including Lasix.   In the ED, he was noted to have a BP of 246/148 with a HR of 101.  EKG with NSR, LVH, antero-lateral T wave inversion, and prolonged QTc.  Initial Trop I was negative, followed by two Trop I's 8 hours apart that were negative.  His CXR shows evidence of pulmonary edema vs infiltrate.  He was noted to have a K of 2.7 in addition to a BUN of 49 and Cr of 5.72.  His Cr has risen to 6.19 this morning. He has a RUE AVF that has no thrill, no bruit and is unusable. He admits to RUQ abdominal fullness.   He was started on a NTG gtt titrated this morning as high as 150 without with continued SOB and CP. His MAP was still around 150 mmHg when I examined him. He has bilateral equal pulses. He is not responding to NTG gtt. At this point, I discussed changing therapy with him.  Informed consent for Arterial line placed in L radial artery this morning for close monitoring of his BP.  At this time, Nitroprusside gtt will be started at 0.25 mcg/kg/min and NTG will be weaned off.  Will decrease his BP by about 25% in first few hours. Will treat as hypertensive emergency, malignant hypertension.  Given his CKD, nitroprusside will need to be monitored carefully. Thiocyanate levels as well as ABG's will be  obtained every 6 hours.  Restart hydralazine 100 mg PO tid and Coreg 25 mg PO bid at this time.  It is curious that he came in with hypokalemia.  One would expect the opposite in his case. Obtain TTKG (proper response would be a TTKG less than 2-3).  Secondary causes of  HTN will need to be considered, including hyperaldosteronism. At this time I would go ahead and obtain a CT w/o contrast of C/A/P or a Aortic U/S (ask radiology for recs) to evaluate his aorta. We can't give him contrast. My concern here would be a Type B dissection all the way to renal artery level causing AKI and severe acute HTN.  Dominic Pea, DO, [facility] 04/20/2013, 11:18 AM

## 2013-04-20 NOTE — Progress Notes (Signed)
  Echocardiogram 2D Echocardiogram has been performed.  Patrick Anthony 04/20/2013, 12:23 PM

## 2013-04-20 NOTE — Consult Note (Signed)
PULMONARY  / CRITICAL CARE MEDICINE  Name: Patrick Anthony MRN: [id number] DOB: 27-Jan-1971    ADMISSION DATE:  04/19/2013  CONSULTATION DATE:  04/20/13  REFERRING MD :  Murlean Caller PRIMARY SERVICE: Teaching service   CHIEF COMPLAINT:  HTN emergency   BRIEF PATIENT DESCRIPTION: 42 yo male with CKD -5 admitted 9/13 with HTN crisis . H/o heroin abuse, UDS POS opiates  SIGNIFICANT EVENTS / STUDIES:  2D echo 9/14>>> US aorta  - no dissection flap  LINES / TUBES: L radial aline 9/14>>>  CULTURES: none  ANTIBIOTICS: none  HISTORY OF PRESENT ILLNESS:  42 yo male with hx HTN, cirrhosis, CKD recently moved to area from [place].  Presented 9/13 with several day hx increased SOB, orthopnea, BLE edema, chest pain. Admits to intermittent medication non compliance and has not been taking lasix as directed.  In ER BP 248/148, inverted t waves and prolonged QTc. Negative troponins.  CXR showed pulm edema.  He was admitted by teaching service, initially to SDU but was tx to ICU for NTG gtt. He is still c/o 6/10 chest pain/pressure, RUQ pain.  Currently denies SOB.  Now on nipride gtt and PCCM consulting.  He moved to [place] from [place] to get away from heroin abuse.   PAST MEDICAL HISTORY :  Past Medical History  Diagnosis Date  . Hypertension   . CHF (congestive heart failure)   . Renal disorder renal failure  . Cirrhosis of liver   . Drug addiction   . ETOH abuse   . Sleep apnea   . Hepatitis    Past Surgical History  Procedure Laterality Date  . Av fistula placement Right    Prior to Admission medications   Medication Sig Start Date End Date Taking? Authorizing Provider  carvedilol (COREG) 25 MG tablet Take 25 mg by mouth 2 (two) times daily with a meal.   Yes Historical Provider, MD  furosemide (LASIX) 40 MG tablet Take 40 mg by mouth 2 (two) times daily.   Yes Historical Provider, MD  hydrALAZINE (APRESOLINE) 100 MG tablet Take 100 mg by mouth every 8 (eight) hours.   Yes Historical Provider, MD   nitroGLYCERIN (NITROSTAT) 0.4 MG SL tablet Place 0.4 mg under the tongue every 5 (five) minutes as needed for chest pain.   Yes Historical Provider, MD  Vitamin D, Ergocalciferol, (DRISDOL) 50000 UNITS CAPS capsule Take 50,000 Units by mouth every 7 (seven) days. Takes on Tuesday   Yes Historical Provider, MD  CLONIDINE HCL PO Take 0.05 mg by mouth 3 (three) times daily.    Historical Provider, MD   No Known Allergies  FAMILY HISTORY:  No family history on file. SOCIAL HISTORY:  reports that he has never smoked. He does not have any smokeless tobacco history on file. He reports that  drinks alcohol. He reports that he does not use illicit drugs.  REVIEW OF SYSTEMS:  As per HPI - all other systems reviewed and were neg.  Cardiac: c/o sscp, burning Pulmonary: dyspne aimproved GI: Good appetite, no change in bowel habits  GU: Still makes urine, no decrease in urine  Neuro: No weakness, numbness/tingling, no vision loss/change, +occipital headache   VITAL SIGNS: Temp:  [97.4 F (36.3 C)-98.8 F (37.1 C)] 97.4 F (36.3 C) (09/14 1151) Pulse Rate:  [72-106] 85 (09/14 1151) Resp:  [11-34] 15 (09/14 0415) BP: (175-246)/(116-159) 175/125 mmHg (09/14 1000) SpO2:  [91 %-100 %] 96 % (09/14 1151) Arterial Line BP: (182-204)/(89-123) 182/89 mmHg (09/14 1151) Weight:  [id number]  lb 12.3 oz (76.1 kg)-170 lb 10.2 oz (77.4 kg)] 170 lb 10.2 oz (77.4 kg) (09/14 0455) HEMODYNAMICS:   VENTILATOR SETTINGS:   INTAKE / OUTPUT: Intake/Output     09/13 0701 - 09/14 0700 09/14 0701 - 09/15 0700   P.O.  240   I.V. (mL/kg) 153.9 (2) 230 (3)   Total Intake(mL/kg) 153.9 (2) 470 (6.1)   Urine (mL/kg/hr) 1600 625 (1.2)   Total Output 1600 625   Net -1446.1 -155          PHYSICAL EXAMINATION: General:  Pleasant male, appears uncomfortable Neuro:  Awake, alert, appropriate, MAE HEENT:  Mm moist, no JVD  Cardiovascular:  s1s2 NSR 80's, no m/r/g Lungs:  resps even non labored, diminished bases otherwise  clear  Abdomen:  Soft, nontender, +bs, tender RUQ Musculoskeletal:  Warm and dry, scant BLE edema, RUE fistula no thrill  LABS:  CBC Recent Labs     04/19/13  1630  WBC  10.2  HGB  10.2*  HCT  30.8*  PLT  256   Coag's Recent Labs     04/20/13  0615  INR  1.08   BMET Recent Labs     04/19/13  2204  04/20/13  0615  04/20/13  0920  NA  136  136  136  K  2.8*  3.2*  3.5  CL  99  99  98  CO2  23  24  27   BUN  49*  50*  49*  CREATININE  5.95*  6.06*  6.19*  GLUCOSE  98  118*  148*   Electrolytes Recent Labs     04/19/13  2204  04/20/13  0615  04/20/13  0920  CALCIUM  8.7  8.5  8.7  MG  2.0   --    --    Sepsis Markers No results found for this basename: LACTICACIDVEN, PROCALCITON, O2SATVEN,  in the last 72 hours ABG Recent Labs     04/20/13  1037  PHART  7.424  PCO2ART  41.0  PO2ART  148.0*   Liver Enzymes Recent Labs     04/19/13  2204  AST  16  ALT  8  ALKPHOS  50  BILITOT  0.7  ALBUMIN  2.7*   Cardiac Enzymes Recent Labs     04/19/13  1633  04/19/13  2204  04/20/13  0615  04/20/13  1200  TROPONINI   --   <0.30  <0.30  <0.30  PROBNP  34876.0*   --    --    --    Glucose No results found for this basename: GLUCAP,  in the last 72 hours  Imaging Dg Chest 2 View  04/19/2013   CLINICAL DATA:  Chest pain, headache, dizziness, cough, congestion.  EXAM: CHEST  2 VIEW  COMPARISON:  None.  FINDINGS: Cardiomegaly. Patchy airspace opacities within the lungs including right upper lobe and both lung bases, left greater than right. Cannot exclude pneumonia. Small left pleural effusion. No acute bony abnormality.  IMPRESSION: Patchy bilateral airspace opacities as above, question pneumonia.  Small left effusion.   Electronically Signed   By: Rolm Baptise M.D.   On: 04/19/2013 17:24     ASSESSMENT / PLAN:  CARDIOVASCULAR HTN emergency  CHF -LVH with strain on EKG Chest pain (trop remains neg)  P:  -renal following  -Aim for MAP 100-110 (1/3  reduction in first 24h) -Cont nipride gtt (nitro gtt was not effective) -Coreg 25 bid , hydralazine 100 tid -PRN  labetolol  -Asa -2D echo pending - performed, not yet read -Abd u/s neg for dissection  PULMONARY Pulmonary edema - in setting htn emergency, ?CHF Improved with BP control, diuresis  P:   -Holding diuresis with worsening Scr and improved volume status    RENAL Acute on chronic renal failure (unknown baseline Scr) Hypokalemia  Clotted RUE fistula  P:   F/u chem  Renal following  Replete K PRN   GASTROINTESTINAL No active issue  P:   2g Na diet   HEMATOLOGIC No active issue  P:  F/u cbc   INFECTIOUS No active infection  P:   Monitor wbc, fever curve   ENDOCRINE No active issue   P:   Monitor glucose on chem   NEUROLOGIC No active issue  P:   PRN pain rx for headache, chest pain    I have personally obtained a history, examined the patient, evaluated laboratory and imaging results, formulated the assessment and plan and placed orders. CRITICAL CARE: The patient is critically ill with multiple organ systems failure and requires high complexity decision making for assessment and support, frequent evaluation and titration of therapies, application of advanced monitoring technologies and extensive interpretation of multiple databases. Critical Care Time devoted to patient care services described in this note is 45 minutes.    *Care during the described time interval was provided by me and/or other providers on the critical care team. I have reviewed this patient's available data, including medical history, events of note, physical examination and test results as part of my evaluation.  Kara Mead MD. Shade Flood. [facility] Pager [phone] If no response call [phone]

## 2013-04-20 NOTE — Progress Notes (Signed)
Subjective: Patient is having a HA with continued substernal, pleuritic CP and some RUQ pain. No SOB. He was managed on nitroglycerine drip overnight, but BP does not seem to be responding to this intervention as BPs still in the 200/100 range.  Objective: Vital signs in last 24 hours: Filed Vitals:   04/20/13 0700 04/20/13 0800 04/20/13 1000 04/20/13 1151  BP: 179/125 205/133 175/125   Pulse: 72 80 76 85  Temp:  97.8 F (36.6 C)  97.4 F (36.3 C)  TempSrc:  Oral  Oral  Resp:      Height:      Weight:      SpO2: 99% 97% 96% 96%   Weight change:   Intake/Output Summary (Last 24 hours) at 04/20/13 1234 Last data filed at 04/20/13 0849  Gross per 24 hour  Intake  507.9 ml  Output   1900 ml  Net -1392.1 ml   Physical Exam General: alert, cooperative, and in no apparent distress HEENT: pupils equal round and reactive to light, vision grossly intact, oropharynx clear and non-erythematous  Neck: supple, no JVD Lungs: clear to ascultation bilaterally, though decreases breath sounds at bilateral bases; normal work of respiration, no wheezes, rales, rhonchi; chest wall nontender to palpation Heart: regular rate and rhythm, no murmurs, gallops, or rubs Abdomen: soft, +RUQ TTP, non-distended, normal bowel sounds Extremities: 2+ DP pulses bilaterally, trace BLE edema Neurologic: alert & oriented X3, cranial nerves II-XII intact, strength grossly intact, sensation intact to light touch  Lab Results: Basic Metabolic Panel:  Recent Labs Lab 04/19/13 1630 04/19/13 2204 04/20/13 0615 04/20/13 0920  NA 136 136 136 136  K 2.7* 2.8* 3.2* 3.5  CL 98 99 99 98  CO2 24 23 24 27   GLUCOSE 98 98 118* 148*  BUN 49* 49* 50* 49*  CREATININE 5.72* 5.95* 6.06* 6.19*  CALCIUM 8.8 8.7 8.5 8.7  MG  --  2.0  --   --    Liver Function Tests:  Recent Labs Lab 04/19/13 2204  AST 16  ALT 8  ALKPHOS 50  BILITOT 0.7  PROT 7.1  ALBUMIN 2.7*   CBC:  Recent Labs Lab 04/19/13 1630  WBC  10.2  HGB 10.2*  HCT 30.8*  MCV 78.8  PLT 256   Cardiac Enzymes:  Recent Labs Lab 04/19/13 2204 04/20/13 0615  TROPONINI <0.30 <0.30   BNP:  Recent Labs Lab 04/19/13 1633  PROBNP 34876.0*   Coagulation:  Recent Labs Lab 04/20/13 0615  LABPROT 13.8  INR 1.08   Urine Drug Screen: Drugs of Abuse     Component Value Date/Time   LABOPIA POSITIVE* 04/20/2013 1108   COCAINSCRNUR NONE DETECTED 04/20/2013 1108   LABBENZ NONE DETECTED 04/20/2013 1108   AMPHETMU NONE DETECTED 04/20/2013 1108   THCU NONE DETECTED 04/20/2013 1108   LABBARB NONE DETECTED 04/20/2013 1108    Micro Results: Recent Results (from the past 240 hour(s))  MRSA PCR SCREENING     Status: None   Collection Time    04/20/13  2:42 AM      Result Value Range Status   MRSA by PCR NEGATIVE  NEGATIVE Final   Comment:            The GeneXpert MRSA Assay (FDA     approved for NASAL specimens     only), is one component of a     comprehensive MRSA colonization     surveillance program. It is not     intended to diagnose MRSA  infection nor to guide or     monitor treatment for     MRSA infections.   Studies/Results: Dg Chest 2 View  04/19/2013   CLINICAL DATA:  Chest pain, headache, dizziness, cough, congestion.  EXAM: CHEST  2 VIEW  COMPARISON:  None.  FINDINGS: Cardiomegaly. Patchy airspace opacities within the lungs including right upper lobe and both lung bases, left greater than right. Cannot exclude pneumonia. Small left pleural effusion. No acute bony abnormality.  IMPRESSION: Patchy bilateral airspace opacities as above, question pneumonia.  Small left effusion.   Electronically Signed   By: Rolm Baptise M.D.   On: 04/19/2013 17:24   Medications: I have reviewed the patient's current medications. Scheduled Meds: . aspirin EC  81 mg Oral Daily  . carvedilol  12.5 mg Oral BID WC  . heparin  5,000 Units Subcutaneous Q8H  . hydrALAZINE  100 mg Oral Q8H  . sodium chloride  3 mL Intravenous Q12H  .  sodium chloride  3 mL Intravenous Q12H  . [START ON 04/22/2013] Vitamin D (Ergocalciferol)  50,000 Units Oral Q Tue   Continuous Infusions: . nitroGLYCERIN 120 mcg/min (04/20/13 0814)  . nitroPRUSSide 0.5 mcg/kg/min (04/20/13 1059)   PRN Meds:.sodium chloride, acetaminophen, acetaminophen, labetalol, morphine injection, nitroGLYCERIN, sodium chloride  Assessment/Plan: #Hypertensive emergency - Patient presented with a four day h/o HA, CP, SOB with BP 210-240s/140-150s on admission. CXR shows signs suspicious for early pulmonary edema, which may be causing patient's dyspnea and chest pain. Given his chronically elevated BPs, RUQ abd pain with concurrent renal failure and unknown baseline Cr, I think it would be prudent to evaluate for aortic dissection. He has CKD, so cannot do CT scan w/ contrast. I spoke to radiologist who recommended doing abd [place] first, and if this is negative can do abd MRI w/o contrast.  - Abd [place] to rule out aortic dissection - originally managed on nitroglycerin drip, but patient unresponsive - switched to nitroprusside drip (titrate to MAP of 110 without going above 60mcg/kg/min -monitor ABGs q8h and thiocyanide level q8h - continue home dose coreg 12.5mg  bid and hydralazine 100mg  TID - holding home lasix 40mg  bid (patient has been taking 40mg  daily at home) and clonidine (pt does not take clonidine at home) -troponins neg x 3 -UDS positive for opiates, but this is appropriate given he received opiates for CP in ED  #Acute decompensated heart failure - Patient with complaint of dyspnea improved by sitting up, chest pain with inspiration, feeling of abdominal distention and lower extremity edema concerning for volume overload upon admission. Patient received lasix 80mg  IV x 2 doses and does not appear fluid overloaded now, though still having some CP. SOB has resolved. He has been taking less than the prescribed amount of lasix at home (taking 40mg  daily as opposed to 40mg   bid).  - hold lasix for now as patient has poor kidney function and no longer appears fluid overloaded - monitor I/Os - Daily weights  - 2D ECHO done today- not yet read  #CKD G5 - GFR 13, Cr 5.72, unknown baseline. Patient followed by nephrologist in [place]. RUE AV fistula placed in January 2014 but became clotted and was ligated in March 2014. Per patient, the plan was for a new AV fistula to be placed.  - Nephrology consulted and recommendations appreciated   #Hypokalemia -K was 2.7 on admission.Unclear etiology. With patient's CKD and recent non-compliance with Lasix would expect elevated K. Young patient with difficult to control blood pressure and low  K may indicate hyperaldosteronism, but Na and bicarb are wnl which makes this diagnosis less likely. After K supplementation with total of Kdur 149mEq, K is now 3.5. Mg is normal.  - 40 mEq Kdur po q4h x 3 doses  - AM BMP  -urinary transtubular potassium gradient - will need to calculate in AM  #RLE edema - Patient with unilateral swelling of RLE on admission. After lasix 80mg  IV twice, patient has trace edema to BLE. RLE venous duplex ordered intitially, but canceled since patient's edema improved and he has no TTP.  - RLE venous duplex canceled   #DVT ppx - SQ heparin  Dispo: Disposition is deferred at this time, awaiting improvement of current medical problems.  Anticipated discharge in approximately 2-3 day(s).   The patient does not have a current PCP (No Pcp Per Patient) and does need an [facility] hospital follow-up appointment after discharge.  The patient does not have transportation limitations that hinder transportation to clinic appointments.  .Services Needed at time of discharge: Y = Yes, Blank = No PT:   OT:   RN:   Equipment:   Other:     LOS: 1 day   Rebecca Eaton, MD 04/20/2013, 12:34 PM

## 2013-04-20 NOTE — ED Notes (Signed)
Dr. Soledad Gerlach aware of no change in pts. BP after Pharmacological management. Pt. Still having a throbbing headache (anterior).  Dr. Soledad Gerlach to evaluate pt. For Critical Care Admission.

## 2013-04-20 NOTE — Consult Note (Signed)
Reason for Consult: Hypertensive emergency with CKD and hypokalemia Referring Physician: Dr. Burt Knack [place] is an 42 y.o. male.  HPI: Patient is a 42 y.o. male with h/o HTN, diastolic CHF (EF 123456), HCV and cirrhosis, CKD stage 4 and OSA, no PCP as recently moved to [place] from [place], who presented to ED with SOB and non-radiating pleuritic chest pain x 4 days and a few weeks of LE edema with no calf pain but with 1 day of dry cough. He was also taking lasix one day vs BID and had missed 2 days earlier this week. He states he felt heavy in his abdomen, fluid overloaded in his legs, and could not sleep "for days" due to the dyspnea. On arrival, BP was elevated to 210-240s/120-150s. He had run out of home clonidine and coreg a few days ago. He was given nitro gtt in ED and transferred to cardiac ICU for further titration.  Pt states he thinks he was diagnosed with CKD stage 4 in Dec 2013 and in January had failed fistula that clotted. He reports never having had hemodialysis. He urinates about every hour at baseline and currently.  He denies current chest pain, dizziness, syncope, fevers, chills, dysuria, vomiting, or diarrhea.    Past Medical History  Diagnosis Date  . Hypertension   . CHF (congestive heart failure)   . Renal disorder renal failure  . Cirrhosis of liver   . Drug addiction   . ETOH abuse   . Sleep apnea   . Hepatitis   He denies h/o UTI, kidney stones, hearing or vision or MSK issues, or DM.  Past Surgical History  Procedure Laterality Date  . Av fistula placement Right    FH: He denies FH of UTI, kidney stones, hearing, vision, or MSK issues, or DM.  Does have FH of HTN.  Social History:  reports that he has never smoked. He does not have any smokeless tobacco history on file. He reports that  drinks alcohol. He reports that he does not use illicit drugs. 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 carvedilol (COREG) 25 MG tablet Take 25 mg by mouth 2 (two) times daily with a meal.      . furosemide (LASIX) 40 MG tablet Take 40 mg by mouth 2 (two) times daily.      . hydrALAZINE (APRESOLINE) 100 MG tablet Take 100 mg by mouth every 8 (eight) hours.      . nitroGLYCERIN (NITROSTAT) 0.4 MG SL tablet Place 0.4 mg under the tongue every 5 (five) minutes as needed for chest pain.      . Vitamin D, Ergocalciferol, (DRISDOL) 50000 UNITS CAPS capsule Take 50,000 Units by mouth every 7 (seven) days. Takes on Tuesday      . CLONIDINE HCL PO Take 0.05 mg by mouth 3 (three) times daily.       Scheduled: . aspirin EC  81 mg Oral Daily  . carvedilol  12.5 mg Oral BID WC  . heparin  5,000 Units Subcutaneous Q8H  . hydrALAZINE  100 mg Oral Q8H  . sodium chloride  3 mL Intravenous Q12H  . sodium chloride  3 mL Intravenous Q12H  . [START ON 04/22/2013] Vitamin D (Ergocalciferol)  50,000 Units Oral Q Tue   Continuous: . nitroGLYCERIN 120 mcg/min (04/20/13 0814)  .  nitroPRUSSide 0.5 mcg/kg/min (04/20/13 1059)   [id number] chloride, acetaminophen, acetaminophen, labetalol, morphine injection, nitroGLYCERIN, sodium chloride  Results for orders placed during the hospital encounter of 04/19/13 (from the past 48 hour(s))  CBC     Status: Abnormal   Collection Time    04/19/13  4:30 PM      Result Value Range   WBC 10.2  4.0 - 10.5 K/uL   RBC 3.91 (*) 4.22 - 5.81 MIL/uL   Hemoglobin 10.2 (*) 13.0 - 17.0 g/dL   HCT 30.8 (*) 39.0 - 52.0 %   MCV 78.8  78.0 - 100.0 fL   MCH 26.1  26.0 - 34.0 pg   MCHC 33.1  30.0 - 36.0 g/dL   RDW 17.1 (*) 11.5 - 15.5 %   Platelets 256  150 - 400 K/uL  BASIC METABOLIC PANEL     Status: Abnormal   Collection Time    04/19/13  4:30 PM      Result Value Range   Sodium 136  135 - 145 mEq/L   Potassium 2.7 (*) 3.5 - 5.1 mEq/L   Comment:  CRITICAL RESULT CALLED TO, READ BACK BY AND VERIFIED WITH:     CAMP,A RN 04/19/13 1723 WOOTEN,K   Chloride 98  96 - 112 mEq/L   CO2 24  19 - 32 mEq/L   Glucose, Bld 98  70 - 99 mg/dL   BUN 49 (*) 6 - 23 mg/dL   Creatinine, Ser 5.72 (*) 0.50 - 1.35 mg/dL   Calcium 8.8  8.4 - 10.5 mg/dL   GFR calc non Af Amer 11 (*) >90 mL/min   GFR calc Af Amer 13 (*) >90 mL/min   Comment: (NOTE)     The eGFR has been calculated using the CKD EPI equation.     This calculation has not been validated in all clinical situations.     eGFR's persistently <90 mL/min signify possible Chronic Kidney     Disease.  PRO B NATRIURETIC PEPTIDE     Status: Abnormal   Collection Time    04/19/13  4:33 PM      Result Value Range   Pro B Natriuretic peptide (BNP) 34876.0 (*) 0 - 125 pg/mL  POCT I-STAT TROPONIN I     Status: None   Collection Time    04/19/13  4:34 PM      Result Value Range   Troponin i, poc 0.06  0.00 - 0.08 ng/mL   Comment 3            Comment: Due to the release kinetics of cTnI,     a negative result within the first hours     of the onset of symptoms does not rule out     myocardial infarction with certainty.     If myocardial infarction is still suspected,     repeat the test at appropriate intervals.  COMPREHENSIVE METABOLIC PANEL     Status: Abnormal   Collection Time    04/19/13 10:04 PM      Result Value Range   Sodium 136  135 - 145 mEq/L   Potassium 2.8 (*) 3.5 - 5.1 mEq/L   Chloride 99  96 - 112 mEq/L   CO2 23  19 - 32 mEq/L   Glucose, Bld 98  70 - 99 mg/dL   BUN 49 (*) 6 - 23 mg/dL   Creatinine, Ser 5.95 (*) 0.50 - 1.35 mg/dL   Calcium 8.7  8.4 - 10.5 mg/dL  Total Protein 7.1  6.0 - 8.3 g/dL   Albumin 2.7 (*) 3.5 - 5.2 g/dL   AST 16  0 - 37 U/L   ALT 8  0 - 53 U/L   Alkaline Phosphatase 50  39 - 117 U/L   Total Bilirubin 0.7  0.3 - 1.2 mg/dL   GFR calc non Af Amer 11 (*) >90 mL/min   GFR calc Af Amer 12 (*) >90 mL/min   Comment: (NOTE)     The eGFR has been  calculated using the CKD EPI equation.     This calculation has not been validated in all clinical situations.     eGFR's persistently <90 mL/min signify possible Chronic Kidney     Disease.  TROPONIN I     Status: None   Collection Time    04/19/13 10:04 PM      Result Value Range   Troponin I <0.30  <0.30 ng/mL   Comment:            Due to the release kinetics of cTnI,     a negative result within the first hours     of the onset of symptoms does not rule out     myocardial infarction with certainty.     If myocardial infarction is still suspected,     repeat the test at appropriate intervals.  MAGNESIUM     Status: None   Collection Time    04/19/13 10:04 PM      Result Value Range   Magnesium 2.0  1.5 - 2.5 mg/dL  MRSA PCR SCREENING     Status: None   Collection Time    04/20/13  2:42 AM      Result Value Range   MRSA by PCR NEGATIVE  NEGATIVE   Comment:            The GeneXpert MRSA Assay (FDA     approved for NASAL specimens     only), is one component of a     comprehensive MRSA colonization     surveillance program. It is not     intended to diagnose MRSA     infection nor to guide or     monitor treatment for     MRSA infections.  TROPONIN I     Status: None   Collection Time    04/20/13  6:15 AM      Result Value Range   Troponin I <0.30  <0.30 ng/mL   Comment:            Due to the release kinetics of cTnI,     a negative result within the first hours     of the onset of symptoms does not rule out     myocardial infarction with certainty.     If myocardial infarction is still suspected,     repeat the test at appropriate intervals.  PROTIME-INR     Status: None   Collection Time    04/20/13  6:15 AM      Result Value Range   Prothrombin Time 13.8  11.6 - 15.2 seconds   INR 1.08  0.00 - [id number]  BASIC METABOLIC PANEL     Status: Abnormal   Collection Time    04/20/13  6:15 AM      Result Value Range   Sodium 136  135 - 145 mEq/L   Potassium 3.2 (*)  3.5 - 5.1 mEq/L   Chloride 99  96 - 112 mEq/L  CO2 24  19 - 32 mEq/L   Glucose, Bld 118 (*) 70 - 99 mg/dL   BUN 50 (*) 6 - 23 mg/dL   Creatinine, Ser 6.06 (*) 0.50 - 1.35 mg/dL   Calcium 8.5  8.4 - 10.5 mg/dL   GFR calc non Af Amer 10 (*) >90 mL/min   GFR calc Af Amer 12 (*) >90 mL/min   Comment: (NOTE)     The eGFR has been calculated using the CKD EPI equation.     This calculation has not been validated in all clinical situations.     eGFR's persistently <90 mL/min signify possible Chronic Kidney     Disease.  BASIC METABOLIC PANEL     Status: Abnormal   Collection Time    04/20/13  9:20 AM      Result Value Range   Sodium 136  135 - 145 mEq/L   Potassium 3.5  3.5 - 5.1 mEq/L   Chloride 98  96 - 112 mEq/L   CO2 27  19 - 32 mEq/L   Glucose, Bld 148 (*) 70 - 99 mg/dL   BUN 49 (*) 6 - 23 mg/dL   Creatinine, Ser 6.19 (*) 0.50 - 1.35 mg/dL   Calcium 8.7  8.4 - 10.5 mg/dL   GFR calc non Af Amer 10 (*) >90 mL/min   GFR calc Af Amer 12 (*) >90 mL/min   Comment: (NOTE)     The eGFR has been calculated using the CKD EPI equation.     This calculation has not been validated in all clinical situations.     eGFR's persistently <90 mL/min signify possible Chronic Kidney     Disease.  BLOOD GAS, ARTERIAL     Status: Abnormal   Collection Time    04/20/13 10:37 AM      Result Value Range   FIO2 0.21     pH, Arterial 7.424  7.350 - 7.450   pCO2 arterial 41.0  35.0 - 45.0 mmHg   pO2, Arterial 148.0 (*) 80.0 - 100.0 mmHg   Bicarbonate 26.3 (*) 20.0 - 24.0 mEq/L   TCO2 27.6  0 - 100 mmol/L   Acid-Base Excess 2.3 (*) 0.0 - 2.0 mmol/L   O2 Saturation 100.0     Patient temperature 98.6     Drawn by COLLECTED BY NURSE     Sample type ARTERIAL DRAW      Dg Chest 2 View  04/19/2013   CLINICAL DATA:  Chest pain, headache, dizziness, cough, congestion.  EXAM: CHEST  2 VIEW  COMPARISON:  None.  FINDINGS: Cardiomegaly. Patchy airspace opacities within the lungs including right upper lobe and  both lung bases, left greater than right. Cannot exclude pneumonia. Small left pleural effusion. No acute bony abnormality.  IMPRESSION: Patchy bilateral airspace opacities as above, question pneumonia.  Small left effusion.   Electronically Signed   By: Rolm Baptise M.D.   On: 04/19/2013 17:24   ROS per HPI   Blood pressure 205/133, pulse 80, temperature 97.8 F (36.6 C), temperature source Oral, resp. rate 15, height 6' (1.829 m), weight 170 lb 10.2 oz (77.4 kg), SpO2 97.00%.  Physical Examination:  GEN: NAD, pleasant HEENT: Atraumatic, normocephalic, neck supple, EOMI, sclera clear, PERRL, MMM no JVD , JVP 5cm, fundus benign no flames hemorrhage or papilledema CV: RRR, II/VI midsystolic murmur heard at L and RUSB, LE edema per below, no presacral edema, JVD present to jawline PULM: Decreased breath sounds throughout, normal effort, no crackles/rales ABD: Soft,  right-sided mild TTP, nondistended, NABS, no organomegaly SKIN: No rash or cyanosis; warm and well-perfused EXTR: 1+ LE edema, no calf tenderness or erythema PSYCH: Mood and affect euthymic, normal rate and volume of speech NEURO: Awake, alert, no focal deficits grossly, normal speech; fundoscopic exam with no papilledema or hemorrhage   Assessment/Plan:  1. Hypertensive emergency - with chest pain, dyspnea, and pulmonary edema. Most likely fluid overload state. Also consider clonidine withdrawal, hyperaldo, renal artery stenosis, medication noncompliance. BP at home 170s/90s. - On nitro drip titrated to keep DBP 105-115 mmHg --> nitroprusside drip, BP now 175/125. ABG wit normal pH and pCO2 (checking regularly while on nitroprusside) - S/p hydralazine 10mg  IV x 2, labetalol total 130 mg IV, continued coreg 12.5mg  BID and hydralazine 100mg  TID, holding clonidine - S/p lasix 80mg  IV x 2 doses; will dose 60mg  IV Q12 hours and monitor; transition tomorrow to PO if good UOP - Add clonidine 0.1mg  TID and amlodipine 5mg  BID - F/u CT  chest/abd/pelvis without contrast to eval for aortic dissection  2. CKD stage 4: Cr 5.72 to 6.19 today, unknown baseline (nephrologist in [place] until recently). RUE AV fistula Jan 2014 clotted and ligated March 2014. Possibly just at baseline vs worsened function with hypertensive emergency. No signs of uremia or respiratory compromise from fluid overload.  - Attempt medical management of fluid overload and HTN with medication first - Tomorrow, will consult [facility] for fistula placement if possible once patient stable. Not ready at this time but possibly at the end of this coming week, and will likely eventually need HD access given young with advanced kidney disease. - Will set up with [facility] for follow-up prior to discharge - K 2.7 initially (prior to receiving IV diuretic), improved with KDur (see below) - Attempt to get records from [place] - Per patient, meeting with SW tomorrow to discuss Medicaid given recent move to [place] from [place].  3. Acute on chronic diastolic heart failure - Dyspnea, chest pain, LE edema and abdominal distension with recent missing and improper lasix at home. Elevated ProBNP. S/p lasix 80mg  IV x 2 with 1600cc output.  - EKG with NSR but antero-lateral T wave inversion; resolved on repeat, possibly from lead placement - CXR with patchy opacities (pulm edema vs infiltrate), PE unlikely with Wells <4,  - IV lasix 60mg  BID; foley for strict I&Os and daily weights - F/u 2D echo and RLE venous duplex,   4. Hypokalemia - CKD with recent noncompliance with lasix, would expect hyperkalemia. No reported GI losses. Kdur dosed 96meq po x 3 and 70meq IV x 1. BMET today with K 3.5. Consider hyperaldosteronism (hypokalemia+HTN), RTA, salt-wasting nephropathy (extremely rare), polyuria - Mg WNL; check UA - Monitor  5. RLE edema - No recent long drive or h/o malignancy; nontender, Wells 3.  - F/u venous duplex, though PE unlikely. SQ heparin for DVT ppx  6. Chest pain - Consider  uremic pericarditis given CKD, PE (though wells score 3), and MI. Troponins negative x 3, EKG around noon with no sign of cardiac ischemia -  continue aspirin 81mg , coreg, IV morphine, SL nitro prn  7. Metabolic Bone Disease: Hold off on workup pending records   Hilton Sinclair, MD  Family Practice PGY-2 04/20/2013, 11:43 AM   Patient seen and examined.  I agree with assessment and plan as above with additions as indicated.  42 yo with CKD presumable due to severe HTN , just moved here from [place], has had attempt at AVF  R arm earlier this year which failed (occluded) , presented with SOB and orthopnea. BP in ED very high, 240/140 range.  Mild early edema on cxr, no resp distress.  Has not been taking all his BP meds or lasix correctly for a few days and this and vol overload and most likely causes for HTN crisis. Agree with nitroprusside for 24 hrs max.  Adding norvasc and resuming clonidine but at therapeutic dose, already on coreg and hydralazine. Diurese with iv lasix 60 q 12. Not uremic, does not need HD at this time.  Should try to get perm access in him prior to d/c, will plan to contact [facility] tomorrow.  Kelly Splinter  MD Pager [phone]    Cell  [phone] 04/20/2013, 8:28 PM

## 2013-04-21 DIAGNOSIS — N19 Unspecified kidney failure: Secondary | ICD-10-CM

## 2013-04-21 DIAGNOSIS — Z01810 Encounter for preprocedural cardiovascular examination: Secondary | ICD-10-CM

## 2013-04-21 DIAGNOSIS — I10 Essential (primary) hypertension: Secondary | ICD-10-CM

## 2013-04-21 LAB — RENAL FUNCTION PANEL
Albumin: 2.4 g/dL — ABNORMAL LOW (ref 3.5–5.2)
BUN: 48 mg/dL — ABNORMAL HIGH (ref 6–23)
CO2: 24 mEq/L (ref 19–32)
Calcium: 8.5 mg/dL (ref 8.4–10.5)
Chloride: 98 mEq/L (ref 96–112)
Creatinine, Ser: 6.3 mg/dL — ABNORMAL HIGH (ref 0.50–1.35)
GFR calc Af Amer: 11 mL/min — ABNORMAL LOW (ref >90)
GFR calc non Af Amer: 10 mL/min — ABNORMAL LOW (ref >90)
Glucose, Bld: 105 mg/dL — ABNORMAL HIGH (ref 70–99)
Phosphorus: 6.2 mg/dL — ABNORMAL HIGH (ref 2.3–4.6)
Potassium: 3.4 mEq/L — ABNORMAL LOW (ref 3.5–5.1)
Sodium: 134 mEq/L — ABNORMAL LOW (ref 135–145)

## 2013-04-21 LAB — IRON AND TIBC
Iron: 76 ug/dL (ref 42–135)
Saturation Ratios: 35 % (ref 20–55)
TIBC: 215 ug/dL (ref 215–435)
UIBC: 139 ug/dL (ref 125–400)

## 2013-04-21 LAB — CBC
HCT: 24.7 % — ABNORMAL LOW (ref 39.0–52.0)
Hemoglobin: 8.2 g/dL — ABNORMAL LOW (ref 13.0–17.0)
MCH: 26.4 pg (ref 26.0–34.0)
MCHC: 33.2 g/dL (ref 30.0–36.0)
MCV: 79.4 fL (ref 78.0–100.0)
Platelets: 189 10*3/uL (ref 150–400)
RBC: 3.11 MIL/uL — ABNORMAL LOW (ref 4.22–5.81)
RDW: 17.1 % — ABNORMAL HIGH (ref 11.5–15.5)
WBC: 5 10*3/uL (ref 4.0–10.5)

## 2013-04-21 MED ORDER — CLONIDINE HCL 0.2 MG PO TABS
0.2000 mg | ORAL_TABLET | Freq: Three times a day (TID) | ORAL | Status: DC
  Administered 2013-04-21 – 2013-04-23 (×9): 0.2 mg via ORAL
  Filled 2013-04-21 (×11): qty 1

## 2013-04-21 MED ORDER — PANTOPRAZOLE SODIUM 40 MG PO TBEC
40.0000 mg | DELAYED_RELEASE_TABLET | Freq: Every day | ORAL | Status: DC
  Administered 2013-04-21 – 2013-04-23 (×3): 40 mg via ORAL
  Filled 2013-04-21 (×3): qty 1

## 2013-04-21 MED ORDER — ENSURE COMPLETE PO LIQD
237.0000 mL | Freq: Two times a day (BID) | ORAL | Status: DC
  Administered 2013-04-21 – 2013-04-24 (×7): 237 mL via ORAL

## 2013-04-21 MED ORDER — NICARDIPINE HCL IN NACL 20-0.86 MG/200ML-% IV SOLN
5.0000 mg/h | INTRAVENOUS | Status: DC
  Administered 2013-04-21: 5 mg/h via INTRAVENOUS
  Filled 2013-04-21 (×3): qty 200

## 2013-04-21 MED ORDER — FUROSEMIDE 80 MG PO TABS
80.0000 mg | ORAL_TABLET | Freq: Two times a day (BID) | ORAL | Status: DC
  Administered 2013-04-21 – 2013-04-23 (×5): 80 mg via ORAL
  Filled 2013-04-21 (×7): qty 1

## 2013-04-21 NOTE — Progress Notes (Signed)
VASCULAR LAB PRELIMINARY  PRELIMINARY  PRELIMINARY  PRELIMINARY  Right  Upper Extremity Vein Map    Cephalic  Segment Diameter Comment  1. Axilla mm Not visualized  2. Mid upper arm mm Not visualized  3. Above AC mm Not visualized  4. In AC mm Not visualized  5. Below AC mm Not visualized  6. Mid forearm mm Not visualized  7. Wrist 71mm Thrombosed   Basilic  Segment Diameter Depth Comment  2. Mid upper arm 3.53mm 5.21mm Enters brachial  3. Above AC 3.75mm 3.46mm   4. In [facility] 3.37mm 2.59mm   5. Below AC 3.15mm 1.76mm   6. Mid forearm 32.61mm mm Thrombosed  7. Wrist mm mm Unable to visualize due to IV     Left Upper Extremity Vein Map    Cephalic  Segment Diameter Comment  1. Axilla 3.51mm   2. Mid upper arm 2.58mm   3. Above AC 2.11mm   4. In [facility] 3.38mm Partially thrombosed  5. Below AC 4.75mm   6. Mid forearm 3.36mm Branch  7. Wrist 49mm    Basilic  Segment Diameter Depth Comment  2. Mid upper arm 4.82mm 4.5mm Enters brachial  3. Above AC 3.53mm 4.44mm   4. In AC 2.48mm 2.44mm   5. Below AC 1.41mm 1.67mm   6. Mid forearm mm mm Unable to visualize due to multiple IVs  7. Wrist mm mm Unable to visualized due to multiple IVs    Anabia Weatherwax, RVS 04/21/2013, 2:28 PM

## 2013-04-21 NOTE — Progress Notes (Signed)
Utilization Review Completed.Donne Anon T9/15/2014

## 2013-04-21 NOTE — Progress Notes (Signed)
[facility] Progress Note    Assessment/ Plan:   1. HTN emergency - Most likely fluid overload. Also consider clonidine withdrawal (though pt states was not taking regularly), hyperaldo, medication noncompliance, renal artery stenosis. Home SBP 170s. - Nitro drip with no improvement --> nitroprusside drip (rec max 24 hours), BP improving. Checking ABG regularly while on nitroprusside. - Continue coreg, hydralazine, clonidine, and amlodipine - Lasix 60mg  IV BID for fluid overload (seen in CXR and with JVP 5cm and LE edema) today, then d/c and monitor - Aortic ultrasound with no dissection/aneurysm  2. CKD stage 4 - Cr 5.72 on admission to 6.3 today, unknown baseilne (nephrologist in [place] until recently). 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 No signs uremia or respiratory compromise. - Medical management with lasix - Consult VVS for fistula placement later this week when more stable - Set up with [facility] for outpatient follow up  - K improving with kdur yesterday and lasix today - Get records from [place] reports meeting with SW to discuss Medicaid given recent move from [place] to [place]  3. Acute on chronic diastolic heart failure - presented with dyspnea and orthopnea, LE edema, missing/improper lasix dosing at home, elevated ProBNP, s/p lasix 80mg  IV x 2 with 1600 cc output. - EKG with NSR yesterday, CXR with patchy opacities, PE unlikely with Wells <4 - Continue lasix 60mg  IV BID - Continue to monitor Cr - F/u 2D echocardiogram today, done  4. Hypokalemia - No reported GI losses. Improving with kdur and lasix. Consider hyperaldo, RTA, rarer salt-wasting nephropathy. Mg WNL. - Monitor  5. 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 Thinks his edema (abd and legs) has improved.   Objective:   BP 120/75  Pulse 79  Temp(Src) 97.5 F (36.4 C) (Oral)  Resp 15  Ht 6' (1.829 m)  Wt 169 lb 8.5 oz (76.9 kg)  BMI 22.99 kg/m2  SpO2 100%  Intake/Output Summary (Last 24 hours) at 04/21/13 0751 Last data filed at 04/21/13 0700  Gross per 24 hour  Intake 1852.25 ml  Output   1850 ml  Net   2.25 ml   Weight change: -7.5 oz (-0.211 kg)  Physical Exam: Gen:NAD, pleasant, lying in bed CVS:RRR, no II/VI systolic murmur, JVP decreased, LE edema decreased to trace Resp:CTAB with improved air entry throughout but still diminished, normal effort [id number], nondistended Ext:LE edema decreased to trace bilaterally, no calf tenderness or erythema  Imaging: Dg Chest 2 View  04/19/2013   CLINICAL DATA:  Chest pain, headache, dizziness, cough, congestion.  EXAM: CHEST  2 VIEW  COMPARISON:  None.  FINDINGS: Cardiomegaly. Patchy airspace opacities within the lungs including right upper lobe and both lung bases, left greater than right. Cannot exclude pneumonia. Small left pleural effusion. No acute bony abnormality.  IMPRESSION: Patchy bilateral airspace opacities as above, question pneumonia.  Small left effusion.   Electronically Signed   By: Rolm Baptise M.D.   On: 04/19/2013 17:24   US Aorta  04/20/2013   *RADIOLOGY REPORT*  Clinical Data:  Evaluate for aortic dissection  ULTRASOUND OF ABDOMINAL AORTA  Technique:  Ultrasound examination of the abdominal aorta was performed to evaluate for abdominal aortic aneurysm.  Comparison: None.  Abdominal Aorta:  The aorta is well seen sonographically.  Only a small portion of the mid to distal aorta is obscured by overlying bowel gas.  The aorta is within normal limits in caliber.  No dissection flap identified.        Maximum AP diameter:  2.8 cm proximally       Maximum TRV diameter:  2.5 cm proximally  Right common iliac artery maximal diameter:  1.3 cm Left  common iliac artery maximal diameter:  1.3 cm  IMPRESSION:  Mildly ectatic but otherwise unremarkable abdominal aorta without aneurysmal dilatation or visualized dissection flap.   Original Report Authenticated By: Jacqulynn Cadet, M.D.    Labs: BMET  Recent Labs Lab 04/19/13 1630 04/19/13 2204 04/20/13 0615 04/20/13 0920 04/21/13 0200  NA 136 136 136 136 134*  K 2.7* 2.8* 3.2* 3.5 3.4*  CL 98 99 99 98 98  CO2 24 23 24 27 24   GLUCOSE 98 98 118* 148* 105*  BUN 49* 49* 50* 49* 48*  CREATININE 5.72* 5.95* 6.06* 6.19* 6.30*  CALCIUM 8.8 8.7 8.5 8.7 8.5  PHOS  --   --   --   --  6.2*   CBC  Recent Labs Lab 04/19/13 1630 04/21/13 0200  WBC 10.2 5.0  HGB 10.2* 8.2*  HCT 30.8* 24.7*  MCV 78.8 79.4  PLT 256 189    Medications:    . amLODipine  10 mg Oral BID  . aspirin EC  81 mg Oral Daily  . carvedilol  25 mg Oral BID WC  . cloNIDine  0.1 mg Oral TID  . furosemide  60 mg Intravenous Q12H  . heparin  5,000 Units Subcutaneous Q8H  . hydrALAZINE  100 mg Oral Q8H  . pantoprazole (PROTONIX) IV  40 mg Intravenous Q24H  . sodium chloride  3 mL Intravenous Q12H  . sodium chloride  3 mL Intravenous Q12H  . [START ON 04/22/2013] Vitamin D (Ergocalciferol)  50,000 Units Oral Q Tue      Hilton Sinclair, MD  Family Practice PGY-2 04/21/2013, 7:51 AM   I have seen and examined this patient and agree with plan Dr Dianah Field.  BP better, agree with [place] nipride.  Will get vein mapping and ask [facility]VS to see.  Check PTH and iron studies.  Change to PO lasix. Pearson Picou T,MD 04/21/2013 9:57 AM

## 2013-04-21 NOTE — Progress Notes (Signed)
PULMONARY  / CRITICAL CARE MEDICINE  Name: Patrick Anthony MRN: [id number] DOB: 23-Apr-1971    ADMISSION DATE:  04/19/2013  CONSULTATION DATE:  04/20/13  REFERRING MD :  Murlean Caller PRIMARY SERVICE: Teaching service   CHIEF COMPLAINT:  HTN emergency   BRIEF PATIENT DESCRIPTION: 42 yo male with CKD -5 admitted 9/13 with HTN crisis . H/o heroin abuse, UDS POS opiates  SIGNIFICANT EVENTS / STUDIES:  2D echo 9/14>>> US aorta  - no dissection flap  LINES / TUBES: L radial aline 9/14>>>  CULTURES: none  ANTIBIOTICS: none  SUBJ - Improved headache Denies CP, dyspnea   VITAL SIGNS: Temp:  [97.4 F (36.3 C)-97.6 F (36.4 C)] 97.6 F (36.4 C) (09/15 0750) Pulse Rate:  [71-141] 79 (09/15 0700) BP: (120-175)/(75-125) 120/75 mmHg (09/15 0000) SpO2:  [95 %-100 %] 100 % (09/15 0700) Arterial Line BP: (119-204)/(68-123) 153/85 mmHg (09/15 0700) Weight:  [76.9 kg (169 lb 8.5 oz)] 76.9 kg (169 lb 8.5 oz) (09/15 0442) HEMODYNAMICS:   VENTILATOR SETTINGS:   INTAKE / OUTPUT: Intake/Output     09/14 0701 - 09/15 0700 09/15 0701 - 09/16 0700   P.O. 540    I.V. (mL/kg) 1312.3 (17.1)    Total Intake(mL/kg) 1852.3 (24.1)    Urine (mL/kg/hr) 1850 (1)    Total Output 1850     Net +2.3            PHYSICAL EXAMINATION: General:  Pleasant male, sitting in bed Neuro:  Awake, alert, appropriate, MAE HEENT:  Mm moist, no JVD  Cardiovascular:  s1s2 NSR 80's, no m/r/g Lungs:  resps even non labored, diminished bases otherwise clear  Abdomen:  Soft, nontender, +bs, tender RUQ Musculoskeletal:  Warm and dry, scant BLE edema, RUE fistula no thrill  LABS:  CBC Recent Labs     04/19/13  1630  04/21/13  0200  WBC  10.2  5.0  HGB  10.2*  8.2*  HCT  30.8*  24.7*  PLT  256  189   Coag's Recent Labs     04/20/13  0615  INR  1.08   BMET Recent Labs     04/20/13  0615  04/20/13  0920  04/21/13  0200  NA  136  136  134*  K  3.2*  3.5  3.4*  CL  99  98  98  CO2  24  27  24   BUN  50*   49*  48*  CREATININE  6.06*  6.19*  6.30*  GLUCOSE  118*  148*  105*   Electrolytes Recent Labs     04/19/13  2204  04/20/13  0615  04/20/13  0920  04/21/13  0200  CALCIUM  8.7  8.5  8.7  8.5  MG  2.0   --    --    --   PHOS   --    --    --   6.2*   Sepsis Markers No results found for this basename: LACTICACIDVEN, PROCALCITON, O2SATVEN,  in the last 72 hours ABG Recent Labs     04/20/13  1037  PHART  7.424  PCO2ART  41.0  PO2ART  148.0*   Liver Enzymes Recent Labs     04/19/13  2204  04/21/13  0200  AST  16   --   ALT  8   --   ALKPHOS  50   --   BILITOT  0.7   --   ALBUMIN  2.7*  2.4*   Cardiac  Enzymes Recent Labs     04/19/13  1633  04/19/13  2204  04/20/13  0615  04/20/13  1200  TROPONINI   --   <0.30  <0.30  <0.30  PROBNP  34876.0*   --    --    --    Glucose No results found for this basename: GLUCAP,  in the last 72 hours  Imaging Dg Chest 2 View  04/19/2013   CLINICAL DATA:  Chest pain, headache, dizziness, cough, congestion.  EXAM: CHEST  2 VIEW  COMPARISON:  None.  FINDINGS: Cardiomegaly. Patchy airspace opacities within the lungs including right upper lobe and both lung bases, left greater than right. Cannot exclude pneumonia. Small left pleural effusion. No acute bony abnormality.  IMPRESSION: Patchy bilateral airspace opacities as above, question pneumonia.  Small left effusion.   Electronically Signed   By: Rolm Baptise M.D.   On: 04/19/2013 17:24   US Aorta  04/20/2013   *RADIOLOGY REPORT*  Clinical Data:  Evaluate for aortic dissection  ULTRASOUND OF ABDOMINAL AORTA  Technique:  Ultrasound examination of the abdominal aorta was performed to evaluate for abdominal aortic aneurysm.  Comparison: None.  Abdominal Aorta:  The aorta is well seen sonographically.  Only a small portion of the mid to distal aorta is obscured by overlying bowel gas.  The aorta is within normal limits in caliber.  No dissection flap identified.        Maximum AP diameter:   2.8 cm proximally       Maximum TRV diameter:  2.5 cm proximally  Right common iliac artery maximal diameter:  1.3 cm Left common iliac artery maximal diameter:  1.3 cm  IMPRESSION:  Mildly ectatic but otherwise unremarkable abdominal aorta without aneurysmal dilatation or visualized dissection flap.   Original Report Authenticated By: Jacqulynn Cadet, M.D.     ASSESSMENT / PLAN:  CARDIOVASCULAR HTN emergency  CHF -LVH with strain on EKG Chest pain (trop remains neg)  P:  -renal following  -Can aim for greater MAP reduction now < 100 -dc  nipride gtt in 6h (nitro gtt was not effective), Use cardene if gtt still required -Coreg 25 bid , hydralazine 100 tid, increase clonidine 0.2 tid -PRN labetolol  -Asa -2D echo pending - performed, not yet read -Abd u/s neg for dissection  PULMONARY Pulmonary edema - in setting htn emergency, ?CHF Improved with BP control, diuresis  P:   -Lasix per renal   RENAL Acute on chronic renal failure (unknown baseline Scr) Hypokalemia  Clotted RUE fistula  P:   Renal following - AVF planned Replete K PRN   GASTROINTESTINAL No active issue  P:   2g Na diet   HEMATOLOGIC No active issue  P:  F/u cbc   INFECTIOUS No active infection  P:   Monitor wbc, fever curve   ENDOCRINE No active issue   P:   Monitor glucose on chem   NEUROLOGIC No active issue  P:   PRN pain rx for headache, chest pain    I have personally obtained a history, examined the patient, evaluated laboratory and imaging results, formulated the assessment and plan and placed orders. CRITICAL CARE: The patient is critically ill with multiple organ systems failure and requires high complexity decision making for assessment and support, frequent evaluation and titration of therapies, application of advanced monitoring technologies and extensive interpretation of multiple databases. Critical Care Time devoted to patient care services described in this note is 31  minutes.  Kara Mead MD. [facility]. [facility] Pager [phone] If no response call [phone]

## 2013-04-21 NOTE — Progress Notes (Signed)
INITIAL NUTRITION ASSESSMENT  DOCUMENTATION CODES Per approved criteria  -Not Applicable   INTERVENTION:  Ensure Complete twice daily (350 kcals, 13 gm protein per 8 fl oz bottle) RD to follow for nutrition care plan  NUTRITION DIAGNOSIS: Inadequate oral intake related to decreased appetite as evidenced by patient report  Goal: Pt to meet >/= 90% of their estimated nutrition needs   Monitor:  PO & supplemental intake, weight, labs, I/O's  Reason for Assessment: Malnutrition Screening Tool Report  42 y.o. male  Admitting Dx: Hypertensive emergency  ASSESSMENT: Patient with PMH of HTN, HCV, cirrhosis and CKD presented with SOB and chest pain; missed some doses of  medications including Lasix; CXR showed evidence of pulmonary edema vs infiltrate; admitted for HTN emergency.  Patient reports a decreased appetite; he states PTA he had not been eating well for 1 week; when RD asked what patient was eating, patient responded "like fruit;" unsure if he's had recent weight loss; no % PO intake per flowsheet records; he would like Ensure supplements during hospitalization ---> RD to order.  Height: Ht Readings from Last 1 Encounters:  04/20/13 6' (1.829 m)    Weight: Wt Readings from Last 1 Encounters:  04/21/13 169 lb 8.5 oz (76.9 kg)    Ideal Body Weight: 178 lb  % Ideal Body Weight: 95%  Wt Readings from Last 10 Encounters:  04/21/13 169 lb 8.5 oz (76.9 kg)    Usual Body Weight: ---  % Usual Body Weight: ---  BMI:  Body mass index is 22.99 kg/(m^2).  Estimated Nutritional Needs: Kcal: 2000-2200 Protein: 100-110 gm Fluid: 2.0-2.2 L  Skin: Intact  Diet Order: Cardiac  EDUCATION NEEDS: -No education needs identified at this time   Intake/Output Summary (Last 24 hours) at 04/21/13 1130 Last data filed at 04/21/13 1000  Gross per 24 hour  Intake 1383.05 ml  Output   1225 ml  Net 158.05 ml    Labs:   Recent Labs Lab 04/19/13 1630 04/19/13 2204  04/20/13 0615 04/20/13 0920 04/21/13 0200  NA 136 136 136 136 134*  K 2.7* 2.8* 3.2* 3.5 3.4*  CL 98 99 99 98 98  CO2 24 23 24 27 24   BUN 49* 49* 50* 49* 48*  CREATININE 5.72* 5.95* 6.06* 6.19* 6.30*  CALCIUM 8.8 8.7 8.5 8.7 8.5  MG  --  2.0  --   --   --   PHOS  --   --   --   --  6.2*  GLUCOSE 98 98 118* 148* 105*    Scheduled Meds: . amLODipine  10 mg Oral BID  . aspirin EC  81 mg Oral Daily  . carvedilol  25 mg Oral BID WC  . cloNIDine  0.2 mg Oral TID  . furosemide  80 mg Oral BID  . heparin  5,000 Units Subcutaneous Q8H  . hydrALAZINE  100 mg Oral Q8H  . pantoprazole  40 mg Oral QHS  . sodium chloride  3 mL Intravenous Q12H  . sodium chloride  3 mL Intravenous Q12H  . [START ON 04/22/2013] Vitamin D (Ergocalciferol)  50,000 Units Oral Q Tue    Continuous Infusions: . nitroPRUSSide 1 mcg/kg/min (04/21/13 0800)    Past Medical History  Diagnosis Date  . Hypertension   . CHF (congestive heart failure)   . Renal disorder renal failure  . Cirrhosis of liver   . Drug addiction   . ETOH abuse   . Sleep apnea   . Hepatitis  Past Surgical History  Procedure Laterality Date  . Av fistula placement Right     Arthur Holms, RD, LDN Pager #: [phone] After-Hours Pager #: [phone]

## 2013-04-22 DIAGNOSIS — I509 Heart failure, unspecified: Secondary | ICD-10-CM

## 2013-04-22 DIAGNOSIS — N184 Chronic kidney disease, stage 4 (severe): Secondary | ICD-10-CM

## 2013-04-22 LAB — RENAL FUNCTION PANEL
Albumin: 2.4 g/dL — ABNORMAL LOW (ref 3.5–5.2)
BUN: 53 mg/dL — ABNORMAL HIGH (ref 6–23)
CO2: 24 mEq/L (ref 19–32)
Calcium: 8.2 mg/dL — ABNORMAL LOW (ref 8.4–10.5)
Chloride: 99 mEq/L (ref 96–112)
Creatinine, Ser: 6.49 mg/dL — ABNORMAL HIGH (ref 0.50–1.35)
GFR calc Af Amer: 11 mL/min — ABNORMAL LOW (ref >90)
GFR calc non Af Amer: 10 mL/min — ABNORMAL LOW (ref >90)
Glucose, Bld: 82 mg/dL (ref 70–99)
Phosphorus: 4.7 mg/dL — ABNORMAL HIGH (ref 2.3–4.6)
Potassium: 3.6 mEq/L (ref 3.5–5.1)
Sodium: 136 mEq/L (ref 135–145)

## 2013-04-22 LAB — CBC
HCT: 25.8 % — ABNORMAL LOW (ref 39.0–52.0)
Hemoglobin: 8.6 g/dL — ABNORMAL LOW (ref 13.0–17.0)
MCH: 26.2 pg (ref 26.0–34.0)
MCHC: 33.3 g/dL (ref 30.0–36.0)
MCV: 78.7 fL (ref 78.0–100.0)
Platelets: 258 10*3/uL (ref 150–400)
RBC: 3.28 MIL/uL — ABNORMAL LOW (ref 4.22–5.81)
RDW: 16.6 % — ABNORMAL HIGH (ref 11.5–15.5)
WBC: 4.9 10*3/uL (ref 4.0–10.5)

## 2013-04-22 LAB — HEPATITIS B SURFACE ANTIGEN: Hepatitis B Surface Ag: NEGATIVE

## 2013-04-22 LAB — PARATHYROID HORMONE, INTACT (NO CA): PTH: 144.2 pg/mL — ABNORMAL HIGH (ref 14.0–72.0)

## 2013-04-22 MED ORDER — DARBEPOETIN ALFA-POLYSORBATE 100 MCG/0.5ML IJ SOLN
100.0000 ug | INTRAMUSCULAR | Status: DC
  Administered 2013-04-22: 100 ug via SUBCUTANEOUS
  Filled 2013-04-22: qty 0.5

## 2013-04-22 NOTE — Progress Notes (Signed)
PULMONARY  / CRITICAL CARE MEDICINE  Name: Patrick Anthony MRN: [id number] DOB: [birth year]    ADMISSION DATE:  04/19/2013  CONSULTATION DATE:  04/22/13  REFERRING MD :  Murlean Caller PRIMARY SERVICE: Teaching service   CHIEF COMPLAINT:  HTN emergency   BRIEF PATIENT DESCRIPTION: 42 yo male with CKD -5 admitted 9/13 with HTN crisis . H/o heroin abuse, UDS POS opiates  SIGNIFICANT EVENTS / STUDIES:  2D echo 9/14>>> LV EF: 40% - 45, %; mild regurgitation of aortic, mitral, pulmonic, and tricuspid valves; mildly thickened leaflets of aortic and mitral valves  US aorta  - no dissection flap  LINES / TUBES: L radial A line 9/14>>>9/16  CULTURES: none  ANTIBIOTICS: none  SUBJECTIVE -  Pt was able to sleep throughout the night with improved appetite this AM (04/22/13). Has flatus but no BM since admission, 04/19/13. Denies CP, dyspnea, orthopnea, headache, dysuria, or hematuria.    VITAL SIGNS: Temp:  [97.4 F (36.3 C)-97.6 F (36.4 C)] 97.6 F (36.4 C) (09/15 2236) Resp:  [18] 18 (09/15 1143) BP: (120-170)/(78-100) 136/86 mmHg (09/16 0800) SpO2:  [95 %-97 %] 97 % (09/15 2100) Arterial Line BP: (126-177)/(68-104) 152/91 mmHg (09/16 0800) Weight:  [77.9 kg (171 lb 11.8 oz)] 77.9 kg (171 lb 11.8 oz) (09/16 0433) HEMODYNAMICS:   VENTILATOR SETTINGS:   INTAKE / OUTPUT: Intake/Output     09/15 0701 - 09/16 0700 09/16 0701 - 09/17 0700   P.O.     I.V. (mL/kg) 395.4 (5.1)    Total Intake(mL/kg) 395.4 (5.1)    Urine (mL/kg/hr) 1025 (0.5)    Total Output 1025     Net -629.6            PHYSICAL EXAMINATION: General:  Pleasant male, sitting up in bed Neuro:  Awake, A & O x 3, appropriate, HEENT:  Mm moist, no JVD  Cardiovascular: NSR 80's, no m/r, positive gallop  Lungs:  Bilaterally CTA, no /w/r/r, resps even non labored Abdomen:  Soft, nontender, +BS Musculoskeletal:  Warm and dry, RUE fistula no thrill  LABS:  CBC Recent Labs     04/19/13  1630  04/21/13  0200  04/22/13   0500  WBC  10.2  5.0  4.9  HGB  10.2*  8.2*  8.6*  HCT  30.8*  24.7*  25.8*  PLT  256  189  258   Coag's Recent Labs     04/20/13  0615  INR  1.08   BMET Recent Labs     04/20/13  0920  04/21/13  0200  04/22/13  0500  NA  136  134*  136  K  3.5  3.4*  3.6  CL  98  98  99  CO2  27  24  24   BUN  49*  48*  53*  CREATININE  6.19*  6.30*  6.49*  GLUCOSE  148*  105*  82   Electrolytes Recent Labs     04/19/13  2204   04/20/13  0920  04/21/13  0200  04/22/13  0500  CALCIUM  8.7   < >  8.7  8.5  8.2*  MG  2.0   --    --    --    --   PHOS   --    --    --   6.2*  4.7*   < > = values in this interval not displayed.   Sepsis Markers No results found for this basename: LACTICACIDVEN, PROCALCITON, O2SATVEN,  in  the last 72 hours ABG Recent Labs     04/20/13  1037  PHART  7.424  PCO2ART  41.0  PO2ART  148.0*   Liver Enzymes Recent Labs     04/19/13  2204  04/21/13  0200  04/22/13  0500  AST  16   --    --   ALT  8   --    --   ALKPHOS  50   --    --   BILITOT  0.7   --    --   ALBUMIN  2.7*  2.4*  2.4*   Cardiac Enzymes Recent Labs     04/19/13  1633  04/19/13  2204  04/20/13  0615  04/20/13  1200  TROPONINI   --   <0.30  <0.30  <0.30  PROBNP  34876.0*   --    --    --    Glucose No results found for this basename: GLUCAP,  in the last 72 hours  Imaging US Aorta  04/20/2013   *RADIOLOGY REPORT*  Clinical Data:  Evaluate for aortic dissection  ULTRASOUND OF ABDOMINAL AORTA  Technique:  Ultrasound examination of the abdominal aorta was performed to evaluate for abdominal aortic aneurysm.  Comparison: None.  Abdominal Aorta:  The aorta is well seen sonographically.  Only a small portion of the mid to distal aorta is obscured by overlying bowel gas.  The aorta is within normal limits in caliber.  No dissection flap identified.        Maximum AP diameter:  2.8 cm proximally       Maximum TRV diameter:  2.5 cm proximally  Right common iliac artery maximal  diameter:  1.3 cm Left common iliac artery maximal diameter:  1.3 cm  IMPRESSION:  Mildly ectatic but otherwise unremarkable abdominal aorta without aneurysmal dilatation or visualized dissection flap.   Original Report Authenticated By: Jacqulynn Cadet, M.D.     ASSESSMENT / PLAN:  CARDIOVASCULAR HTN emergency  CHF -LVH with strain on EKG Chest pain (resolved) P:  -can normalise MAP now , d/c Cardene gtt -Coreg 25 bid , hydralazine 100 tid, clonidine 0.2 tid -PRN labetolol  -Asa -2D echo-  LV EF: 40% - 45, %; mild regurgitation of aortic, mitral, pulmonic, and tricuspid valves; mildly thickened leaflets of aortic and mitral valves -Abd u/s neg for dissection  PULMONARY Pulmonary edema - in setting htn emergency, ?CHF Improved with BP control, diuresis  P:   -Lasix per renal   RENAL Acute on chronic renal failure (unknown baseline Scr) Hypokalemia  Clotted RUE fistula  P:   Renal following - AVF planned Replete K PRN   GASTROINTESTINAL No active issue  P:   2g Na diet, heart healthy diet    HEMATOLOGIC Anemia secondary to ARF  P:  F/u cbc   INFECTIOUS No active infection  P:   Monitor wbc, fever curve   ENDOCRINE No active issue   P:   Monitor glucose on chem   NEUROLOGIC No active issue  P:   PRN pain rx for headache, chest pain  Sleep study as outpt, for possible sleep apnea - I have scheduled  Pt's blood pressure seems to be under better control, MAP 90s. He denies any chest pain, SOB, orthopnea, headache, n/v. Counseled pt on importance of medication compliance.   I have personally obtained a history, examined the patient, evaluated laboratory and imaging results, formulated the assessment and plan and placed orders.  Transfer to floor &  ask IMTS to take over   Kara Mead MD. [facility]. [place] Pager [phone] If no response call [phone]

## 2013-04-22 NOTE — Progress Notes (Signed)
[facility] Progress Note    Assessment/ Plan:   1. HTN emergency - Most likely fluid overload. Also consider clonidine withdrawal (though pt states was not taking regularly), hyperaldo, medication noncompliance, renal artery stenosis. Home SBP 170s. - Previously on nitroprusside drip, now off all drips - Continue coreg, hydralazine, clonidine, and amlodipine - Lasix 60mg  IV BID for fluid overload (seen in CXR and with JVP 5cm and LE edema) today, then d/c and monitor - Aortic ultrasound with no dissection/aneurysm  2. CKD stage 4 - Cr 5.72 on admission, presently rising unknown baseilne (nephrologist in [place] until recently). 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 No signs uremia or respiratory compromise. - Medical management with lasix - Consult [facility] for fistula placement later this week when more stable - Set up with [facility] for outpatient follow up  - K improving with kdur - Get records from [place] reports meeting with SW to discuss Medicaid given recent move from [place] to [place]  3. Anemia: likely of chronic disease as normocytic, iron studies normal - continue to monitor Hgb  5. Acute on chronic diastolic heart failure - EF [id number], grade 2 diastolic dysfunction, presented with dyspnea and orthopnea, LE edema, missing/improper lasix dosing at home, elevated ProBNP - Continue lasix 60mg  IV BID - Continue to monitor Cr  6. Hypokalemia - No reported GI losses. Improving with kdur and lasix. Consider hyperaldo, RTA, rarer salt-wasting nephropathy. Mg WNL. - Monitor & replete PRN  7. Chest pain - Consider uremic pericarditis, PE (wells 3 - unlikely), MI (troponins neg, EKG with no ischemic changes) - Cotninue asa, coreg, IV morphine, SL nitro PRN  8. MBD - PTH pending  Subjective:   Reports some chest pain and shortness of breath overnight and currently; denies dizziness, syncope. Thinks his edema (abd and legs) has improved.   Objective:   BP 120/78  Pulse  79  Temp(Src) 97.6 F (36.4 C) (Oral)  Resp 18  Ht 6' (1.829 m)  Wt 171 lb 11.8 oz (77.9 kg)  BMI 23.29 kg/m2  SpO2 97%  Intake/Output Summary (Last 24 hours) at 04/22/13 0813 Last data filed at 04/22/13 0600  Gross per 24 hour  Intake  362.2 ml  Output   1025 ml  Net -662.8 ml   Weight change: 2 lb 3.3 oz (1 kg)  Physical Exam: Gen:NAD, pleasant, lying in bed CVS:RRR, no II/VI systolic murmur, JVP decreased, LE edema decreased to trace Resp:CTAB with improved air entry throughout but still diminished, normal effort [id number], nondistended Ext:LE edema decreased to trace bilaterally, no calf tenderness or erythema  Imaging: US Aorta  04/20/2013   *RADIOLOGY REPORT*  Clinical Data:  Evaluate for aortic dissection  ULTRASOUND OF ABDOMINAL AORTA  Technique:  Ultrasound examination of the abdominal aorta was performed to evaluate for abdominal aortic aneurysm.  Comparison: None.  Abdominal Aorta:  The aorta is well seen sonographically.  Only a small portion of the mid to distal aorta is obscured by overlying bowel gas.  The aorta is within normal limits in caliber.  No dissection flap identified.        Maximum AP diameter:  2.8 cm proximally       Maximum TRV diameter:  2.5 cm proximally  Right common iliac artery maximal diameter:  1.3 cm Left common iliac artery maximal diameter:  1.3 cm  IMPRESSION:  Mildly ectatic but otherwise unremarkable abdominal aorta without aneurysmal dilatation or visualized dissection flap.   Original Report Authenticated By: Jacqulynn Cadet, M.D.  Labs: BMET  Recent Labs Lab 04/19/13 1630 04/19/13 2204 04/20/13 0615 04/20/13 0920 04/21/13 0200 04/22/13 0500  NA 136 136 136 136 134* 136  K 2.7* 2.8* 3.2* 3.5 3.4* 3.6  CL 98 99 99 98 98 99  CO2 24 23 24 27 24 24   GLUCOSE 98 98 118* 148* 105* 82  BUN 49* 49* 50* 49* 48* 53*  CREATININE 5.72* 5.95* 6.06* 6.19* 6.30* 6.49*  CALCIUM 8.8 8.7 8.5 8.7 8.5 8.2*  PHOS  --   --   --   --  6.2* 4.7*    CBC  Recent Labs Lab 04/19/13 1630 04/21/13 0200 04/22/13 0500  WBC 10.2 5.0 4.9  HGB 10.2* 8.2* 8.6*  HCT 30.8* 24.7* 25.8*  MCV 78.8 79.4 78.7  PLT 256 189 258     Medications:    . amLODipine  10 mg Oral BID  . aspirin EC  81 mg Oral Daily  . carvedilol  25 mg Oral BID WC  . cloNIDine  0.2 mg Oral TID  . feeding supplement  237 mL Oral BID BM  . furosemide  80 mg Oral BID  . heparin  5,000 Units Subcutaneous Q8H  . hydrALAZINE  100 mg Oral Q8H  . pantoprazole  40 mg Oral QHS  . sodium chloride  3 mL Intravenous Q12H  . sodium chloride  3 mL Intravenous Q12H  . Vitamin D (Ergocalciferol)  50,000 Units Oral Q Tue      Leeanne Rio, MD  Family Practice PGY-2 04/22/2013, 8:13 AM  I have seen and examined this patient and agree with plan per Dr Ardelia Mems. Anemic with Nl iron studies so will start aranesp.  PTH pending.  Risinf Scr not surprising with control of BP and decreasing renal perfusion.  Hopefully this will stabilize in next day of so.  He can move out of unit from my standpoint. Recheck Scr in AM. Stevin Bielinski T,MD 04/22/2013 9:17 AM

## 2013-04-22 NOTE — Plan of Care (Signed)
Problem: Food- and Nutrition-Related Knowledge Deficit (NB-1.1) Goal: Nutrition education Formal process to instruct or train a patient/client in a skill or to impart knowledge to help patients/clients voluntarily manage or modify food choices and eating behavior to maintain or improve health. Outcome: Completed/Met Date Met:  04/22/13  RD consulted for renal education. Provided Choose-A-Meal Booklet to patient. Per diet recall, he eats fairly healthy, however, does eat out a lot; encouraged him to try and cook more at home. Reviewed food groups and provided written recommended serving sizes specifically determined for patient's current nutritional status.   Explained why diet restrictions are needed and provided lists of foods to limit/avoid that are high potassium, sodium, and phosphorus. Provided specific recommendations on safer alternatives of these foods. Discussed need for fluid restriction with dialysis, importance of minimizing weight gain between HD treatments, and renal-friendly beverage options.  Teach back method used.  Expect good compliance.  Please re-consult RD as needed should patient have further questions and/or concerns.  Arthur Holms, RD, LDN Pager #: [phone] After-Hours Pager #: [phone]

## 2013-04-23 DIAGNOSIS — E8779 Other fluid overload: Secondary | ICD-10-CM

## 2013-04-23 DIAGNOSIS — I120 Hypertensive chronic kidney disease with stage 5 chronic kidney disease or end stage renal disease: Principal | ICD-10-CM

## 2013-04-23 LAB — BASIC METABOLIC PANEL
BUN: 59 mg/dL — ABNORMAL HIGH (ref 6–23)
CO2: 25 mEq/L (ref 19–32)
Calcium: 8.1 mg/dL — ABNORMAL LOW (ref 8.4–10.5)
Chloride: 99 mEq/L (ref 96–112)
Creatinine, Ser: 7.01 mg/dL — ABNORMAL HIGH (ref 0.50–1.35)
GFR calc Af Amer: 10 mL/min — ABNORMAL LOW (ref >90)
GFR calc non Af Amer: 9 mL/min — ABNORMAL LOW (ref >90)
Glucose, Bld: 119 mg/dL — ABNORMAL HIGH (ref 70–99)
Potassium: 3.3 mEq/L — ABNORMAL LOW (ref 3.5–5.1)
Sodium: 137 mEq/L (ref 135–145)

## 2013-04-23 MED ORDER — AMLODIPINE BESYLATE 10 MG PO TABS
10.0000 mg | ORAL_TABLET | Freq: Every day | ORAL | Status: DC
  Administered 2013-04-24: 10 mg via ORAL
  Filled 2013-04-23: qty 1

## 2013-04-23 MED ORDER — FUROSEMIDE 80 MG PO TABS
80.0000 mg | ORAL_TABLET | Freq: Two times a day (BID) | ORAL | Status: DC
  Administered 2013-04-23 – 2013-04-24 (×2): 80 mg via ORAL
  Filled 2013-04-23 (×4): qty 1

## 2013-04-23 MED ORDER — CALCITRIOL 0.25 MCG PO CAPS
0.2500 ug | ORAL_CAPSULE | Freq: Every day | ORAL | Status: DC
  Administered 2013-04-23 – 2013-04-24 (×2): 0.25 ug via ORAL
  Filled 2013-04-23 (×2): qty 1

## 2013-04-23 MED ORDER — DARBEPOETIN ALFA-POLYSORBATE 100 MCG/0.5ML IJ SOLN: 100.0000 ug | INTRAMUSCULAR | Status: DC

## 2013-04-23 MED ORDER — POTASSIUM CHLORIDE CRYS ER 20 MEQ PO TBCR
20.0000 meq | EXTENDED_RELEASE_TABLET | Freq: Once | ORAL | Status: DC
  Filled 2013-04-23 (×2): qty 1

## 2013-04-23 MED ORDER — POTASSIUM CHLORIDE CRYS ER 20 MEQ PO TBCR
40.0000 meq | EXTENDED_RELEASE_TABLET | Freq: Two times a day (BID) | ORAL | Status: DC
  Administered 2013-04-23 (×2): 40 meq via ORAL
  Filled 2013-04-23 (×3): qty 2

## 2013-04-23 NOTE — Consult Note (Signed)
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 male who recently moved here from [place].  He was admitted 4 days ago when he presented to the ED with SOB and CP that was non-radiating and pleuritic in nature.  He did have complaints of LE edema.  On arrival in the ED, his systolic BP was [id number] and diastolic was [id number].  He had run out of his antihypertensive medication.  He was started on a NTG gtt and transported to the ICU.  His creatinine has continued to rise and VVS is consulted for permanent access placement.  He has had vein mapping, which is shown below.   He has had a previous fistula placement in the right arm that failed and clotted.  States he did have his AVF clot and he had to have it de-clotted twice.  He also developed severe swelling in the RUE and after the 2nd attempt to salvage the fistula, it was ligated.  The swelling decreased and the RUE returned to normal. His BP is not under much better control.  Past Medical History  Diagnosis Date  . Hypertension   . CHF (congestive heart failure)   . Renal disorder renal failure  . Cirrhosis of liver   . Drug addiction   . ETOH abuse   . Sleep apnea   . Hepatitis    Past Surgical History  Procedure Laterality Date  . Av fistula placement Right     No Known Allergies  Prior to Admission medications   Medication Sig Start Date End Date Taking? Authorizing Provider  carvedilol (COREG) 25 MG tablet Take 25 mg by mouth 2 (two) times daily with a meal.   Yes Historical Provider, MD  furosemide (LASIX) 40 MG tablet Take 40 mg by mouth 2 (two) times daily.   Yes Historical Provider, MD  hydrALAZINE (APRESOLINE) 100 MG tablet Take 100 mg by mouth every 8 (eight) hours.   Yes Historical Provider, MD  nitroGLYCERIN (NITROSTAT) 0.4 MG SL tablet Place 0.4 mg under the tongue every 5  (five) minutes as needed for chest pain.   Yes Historical Provider, MD  Vitamin D, Ergocalciferol, (DRISDOL) 50000 UNITS CAPS capsule Take 50,000 Units by mouth every 7 (seven) days. Takes on Tuesday   Yes Historical Provider, MD    Statin:  no Beta Blocker:  yes Aspirin:  yes ACEI:  no ARB:  no Other antiplatelets/anticoagulants:  no  History   Social History  . Marital Status: Single    Spouse Name: N/A    Number of Children: N/A  . Years of Education: N/A   Occupational History  . Not on file.   Social History Main Topics  . Smoking status: Never Smoker   . Smokeless tobacco: Not on file  . Alcohol Use: Yes  . Drug Use: No     Comment: Quit  . Sexual Activity: Not on file   Other Topics Concern  . Not on file   Social History Narrative  . No narrative on file    Family Hx: Mother:  Living age 64 with HTN Father:  Status unknown  ROS: [x]  Positive   [ ]  Negative   [ ]  All sytems reviewed and are negative  Cardiovascular: [x]  chest pain/pressure []  palpitations [x]  SOB  []  DOE []  pain in legs while walking []  pain in legs at rest []   pain in legs at night []  non-healing ulcers []  hx of DVT [x]  swelling in legs   Pulmonary: []  productive cough []  asthma/wheezing []  home O2  Neurologic: []  weakness in []  arms []  legs []  numbness in []  arms []  legs []  hx of CVA []  mini stroke [] difficulty speaking or slurred speech []  temporary loss of vision in one eye []  dizziness [x]  migraines with recent HTN  Hematologic: []  hx of cancer []  bleeding problems []  problems with blood clotting easily  Endocrine:   []  diabetes []  thyroid disease  GI []  vomiting blood []  blood in stool  GU: [x]  CKD/renal failure-not on HD at this time []  HD--[]  M/W/F or []  T/T/S []  burning with urination []  blood in urine  Psychiatric: []  anxiety []  depression  Musculoskeletal: []  arthritis []  joint pain  Integumentary: []  rashes []   ulcers  Constitutional: []  fever []  chills   Physical Examination  Filed Vitals:   04/23/13 0913  BP: 111/66  Pulse: 78  Temp: 98.4 F (36.9 C)  Resp: 20   Body mass index is 23.29 kg/(m^2).  General:  WDWN in NAD Gait: Not observed HENT: WNL, normocephalic Eyes: Pupils equal Pulmonary: normal non-labored breathing, without Rales, rhonchi,  wheezing Cardiac: regular, without  Murmurs, rubs or gallops; without carotid bruits Abdomen: soft, NT/ND, no masses Skin: without rashes, without ulcers  Vascular Exam/Pulses:+ bilateral radial pulses; + palpable pedal pulses Extremities: without ischemic changes, without Gangrene , without cellulitis; without open wounds;  Well healed scar in the antecubital space. Musculoskeletal: no muscle wasting or atrophy  Neurologic: A&O X 3; Appropriate Affect ; SENSATION: normal; MOTOR FUNCTION:  moving all extremities equally. Speech is fluent/normal  CBC    Component Value Date/Time   WBC 4.9 04/22/2013 0500   RBC 3.28* 04/22/2013 0500   HGB 8.6* 04/22/2013 0500   HCT 25.8* 04/22/2013 0500   PLT 258 04/22/2013 0500   MCV 78.7 04/22/2013 0500   MCH 26.2 04/22/2013 0500   MCHC 33.3 04/22/2013 0500   RDW 16.6* 04/22/2013 0500    BMET    Component Value Date/Time   NA 137 04/23/2013 0514   K 3.3* 04/23/2013 0514   CL 99 04/23/2013 0514   CO2 25 04/23/2013 0514   GLUCOSE 119* 04/23/2013 0514   BUN 59* 04/23/2013 0514   CREATININE 7.01* 04/23/2013 0514   CALCIUM 8.1* 04/23/2013 0514   GFRNONAA 9* 04/23/2013 0514   GFRAA 10* 04/23/2013 0514    COAGS: Lab Results  Component Value Date   INR 1.08 04/20/2013     Non-Invasive Vascular Imaging:  BUE vein mapping A999333:  RUE: Cephalic  Segment  Diameter  Comment   1. Axilla  mm  Not visualized   2. Mid upper arm  mm  Not visualized   3. Above AC  mm  Not visualized   4. In AC  mm  Not visualized   5. Below AC  mm  Not visualized   6. Mid forearm  mm  Not visualized   7. Wrist  64mm   Thrombosed   Basilic  Segment  Diameter  Depth  Comment   2. Mid upper arm  3.67mm  5.25mm  Enters brachial   3. Above AC  3.87mm  3.75mm    4. In [facility]  3.31mm  2.44mm    5. Below AC  3.58mm  1.27mm    6. Mid forearm  32.61mm  mm  Thrombosed   7. Wrist  mm  mm  Unable  to visualize due to IV    LUE: Cephalic  Segment  Diameter  Comment   1. Axilla  3.48mm    2. Mid upper arm  2.1mm    3. Above AC  2.31mm    4. In [facility]  3.30mm  Partially thrombosed   5. Below AC  4.47mm    6. Mid forearm  3.5mm  Branch   7. Wrist  81mm    Basilic  Segment  Diameter  Depth  Comment   2. Mid upper arm  4.18mm  4.20mm  Enters brachial   3. Above AC  3.7mm  4.57mm    4. In AC  2.75mm  2.30mm    5. Below AC  1.72mm  1.29mm    6. Mid forearm  mm  mm  Unable to visualize due to multiple IVs   7. Wrist  mm  mm  Unable to visualized due to multiple IVs      ASSESSMENT: This is a 42 y.o. male with CKD4 admitted with HTN emergency in need of permanent HD placement.  He has had a failed RUE AVF in the past.  PLAN: -Dr. Kellie Simmering to see pt later today.  Will determine plan at that time.  Pt most likely will benefit from left brachiocephalic AVF considering he had swelling in the right arm with AVF.   Leontine Locket, PA-C [facility] [phone]  Agree with above assessment Patient is left-handed and has had significant edema following a right arm AV fistula which was unsuccessful Left cephalic vein appears likely to be adequate-otherwise patient will be basilic vein transposition  We'll schedule for left brachial vein cephalic AV fistula next week as outpatient

## 2013-04-23 NOTE — Progress Notes (Signed)
Subjective: Patient feeling back to baseline today. No CP, SOB, lightheadedness/dizziness. He reports having some heart palpitations at night, but he has had this for many years. He reports that he has disability insurance and he will need assistance paying for his medications until 10/1 when he gets his disability check. He also has [place] medicaid application pending. Three out of four of his antihypertensives are on the [facility] 4 dollar list. Case manager is working on getting him a coupon for amlodipine.  Objective: Vital signs in last 24 hours: Filed Vitals:   04/22/13 1335 04/22/13 1554 04/22/13 2021 04/23/13 0441  BP: 124/83 110/68 110/76 118/74  Pulse:  64 71 75  Temp:  97.5 F (36.4 C) 97.6 F (36.4 C) 98.4 F (36.9 C)  TempSrc:   Oral Oral  Resp:  18 18 18   Height:   6' (1.829 m)   Weight:   171 lb 11.8 oz (77.9 kg)   SpO2:  99% 98% 99%   Weight change: -0 oz (-0 kg)  Intake/Output Summary (Last 24 hours) at 04/23/13 0732 Last data filed at 04/23/13 0443  Gross per 24 hour  Intake    483 ml  Output    200 ml  Net    283 ml   Physical Exam General: alert, cooperative, and in no apparent distress HEENT: pupils equal round and reactive to light, vision grossly intact, oropharynx clear and non-erythematous  Neck: supple, no JVD Lungs: clear to ascultation bilaterally, no wheezes, rales, rhonchi; chest wall nontender to palpation Heart: regular rate and rhythm, no murmurs, gallops, or rubs Abdomen: soft, nontender, non-distended, normal bowel sounds Extremities: 2+ DP pulses bilaterally, no BLE edema Neurologic: alert & oriented X3, cranial nerves II-XII intact, strength grossly intact, sensation intact to light touch  Lab Results: Basic Metabolic Panel:  Recent Labs Lab 04/19/13 1630 04/19/13 2204  04/21/13 0200 04/22/13 0500 04/23/13 0514  NA 136 136  < > 134* 136 137  K 2.7* 2.8*  < > 3.4* 3.6 3.3*  CL 98 99  < > 98 99 99  CO2 24 23  < > 24 24 25   GLUCOSE  98 98  < > 105* 82 119*  BUN 49* 49*  < > 48* 53* 59*  CREATININE 5.72* 5.95*  < > 6.30* 6.49* 7.01*  CALCIUM 8.8 8.7  < > 8.5 8.2* 8.1*  MG  --  2.0  --   --   --   --   PHOS  --   --   --  6.2* 4.7*  --   < > = values in this interval not displayed. Liver Function Tests:  Recent Labs Lab 04/19/13 2204 04/21/13 0200 04/22/13 0500  AST 16  --   --   ALT 8  --   --   ALKPHOS 50  --   --   BILITOT 0.7  --   --   PROT 7.1  --   --   ALBUMIN 2.7* 2.4* 2.4*   CBC:  Recent Labs Lab 04/21/13 0200 04/22/13 0500  WBC 5.0 4.9  HGB 8.2* 8.6*  HCT 24.7* 25.8*  MCV 79.4 78.7  PLT 189 258   Cardiac Enzymes:  Recent Labs Lab 04/19/13 2204 04/20/13 0615 04/20/13 1200  TROPONINI <0.30 <0.30 <0.30   BNP:  Recent Labs Lab 04/19/13 1633  PROBNP 34876.0*   Coagulation:  Recent Labs Lab 04/20/13 0615  LABPROT 13.8  INR 1.08   Urine Drug Screen: Drugs of Abuse  Component Value Date/Time   LABOPIA POSITIVE* 04/20/2013 1108   COCAINSCRNUR NONE DETECTED 04/20/2013 1108   LABBENZ NONE DETECTED 04/20/2013 1108   AMPHETMU NONE DETECTED 04/20/2013 1108   THCU NONE DETECTED 04/20/2013 1108   LABBARB NONE DETECTED 04/20/2013 1108    Micro Results: Recent Results (from the past 240 hour(s))  MRSA PCR SCREENING     Status: None   Collection Time    04/20/13  2:42 AM      Result Value Range Status   MRSA by PCR NEGATIVE  NEGATIVE Final   Comment:            The GeneXpert MRSA Assay (FDA     approved for NASAL specimens     only), is one component of a     comprehensive MRSA colonization     surveillance program. It is not     intended to diagnose MRSA     infection nor to guide or     monitor treatment for     MRSA infections.   Studies/Results:  Dg Chest 2 View  04/19/2013   CLINICAL DATA:  Chest pain, headache, dizziness, cough, congestion.  EXAM: CHEST  2 VIEW  COMPARISON:  None.  FINDINGS: Cardiomegaly. Patchy airspace opacities within the lungs including right  upper lobe and both lung bases, left greater than right. Cannot exclude pneumonia. Small left pleural effusion. No acute bony abnormality.  IMPRESSION: Patchy bilateral airspace opacities as above, question pneumonia.  Small left effusion.   Electronically Signed   By: Rolm Baptise M.D.   On: 04/19/2013 17:24   US Aorta  04/20/2013   *RADIOLOGY REPORT*  Clinical Data:  Evaluate for aortic dissection  ULTRASOUND OF ABDOMINAL AORTA  Technique:  Ultrasound examination of the abdominal aorta was performed to evaluate for abdominal aortic aneurysm.  Comparison: None.  Abdominal Aorta:  The aorta is well seen sonographically.  Only a small portion of the mid to distal aorta is obscured by overlying bowel gas.  The aorta is within normal limits in caliber.  No dissection flap identified.        Maximum AP diameter:  2.8 cm proximally       Maximum TRV diameter:  2.5 cm proximally  Right common iliac artery maximal diameter:  1.3 cm Left common iliac artery maximal diameter:  1.3 cm  IMPRESSION:  Mildly ectatic but otherwise unremarkable abdominal aorta without aneurysmal dilatation or visualized dissection flap.   Original Report Authenticated By: Jacqulynn Cadet, M.D.  ECHO: 9/14  - Left ventricle: The cavity size was normal. Wall thickness was increased in a pattern of mild LVH. There was moderate concentric hypertrophy. Systolic function was mildly to moderately reduced. The estimated ejection fraction was in the range of 40% to 45%. Diffuse hypokinesis. Features are consistent with a pseudonormal left ventricular filling pattern, with concomitant abnormal relaxation and increased filling pressure (grade 2 diastolic dysfunction). Doppler parameters are consistent with elevated mean left atrial filling pressure. - Aortic valve: Trileaflet; mildly thickened leaflets. Mild regurgitation. - Mitral valve: Mildly thickened leaflets . Mild regurgitation. - Left atrium: The atrium was moderately dilated. - Atrial  septum: No defect or patent foramen ovale was identified. - Tricuspid valve: Mild-moderate regurgitation. - Pulmonic valve: Mild regurgitation. - Pericardium, extracardiac: A trivial pericardial effusion was identified posterior to the heart.  Medications: I have reviewed the patient's current medications. Scheduled Meds: . amLODipine  10 mg Oral BID  . aspirin EC  81 mg Oral Daily  . carvedilol  25 mg Oral BID WC  . cloNIDine  0.2 mg Oral TID  . darbepoetin  100 mcg Subcutaneous Q7 days  . feeding supplement  237 mL Oral BID BM  . furosemide  80 mg Oral BID  . heparin  5,000 Units Subcutaneous Q8H  . hydrALAZINE  100 mg Oral Q8H  . pantoprazole  40 mg Oral QHS  . sodium chloride  3 mL Intravenous Q12H  . sodium chloride  3 mL Intravenous Q12H  . Vitamin D (Ergocalciferol)  50,000 Units Oral Q Tue   Continuous Infusions:   PRN Meds:.sodium chloride, acetaminophen, acetaminophen, hydrALAZINE, labetalol, morphine injection, nitroGLYCERIN, sodium chloride  Assessment/Plan:  #Hypertensive emergency - Patient presented with a four day h/o HA, CP, SOB with BP 210-240s/140-150s on admission. CXR shows signs suspicious for early pulmonary edema, which may be causing patient's dyspnea and chest pain. Given his chronically elevated BPs, RUQ abd pain with concurrent renal failure and unknown baseline Cr, we obtained abd [place], which was negative for aortic dissection.  Patient was admitted to ICU and managed initially on NTG drip, but was not responsive to this, so then switched to nitroprusside drip, which patient responded well to. After BP was controlled, patient managed for brief time on cardene drip. Patient was also managed on the following PO medications: coreg 25mg  bid, hydralazine 100mg  TID, clonidine .2mg  TID, amlodipine 10mg  BID, Lasix 80mg  IV. His blood pressures are very well controlled now and he is asymptomatic.  -transition to lasix 80mg  PO BID today -continue above  mediations -troponins neg x 3 upon admission -Case management on board- all antihypertensives are on 4 dollar list except amlodipine, for which CM will work on obtaining a coupon  #Acute on chronic systolic and diastolic CHF- Patient was fluid overloaded upon admission, but is not fluid overloaded now. He has been managed on lasix 80mg  IV bid since admission. Echo done 9/14 showed EF 40-45% with grade 2 diastolic dysfunction. -continue lasix (transition to PO today)  #CKD G5 - Upon admission, GFR 13 and Cr 5.72. Patient is followed by nephrologist in [place], and we do not know his baseline renal function. Nephrology has been following patient and recommend outpatient follow up. He will need a fistula placed and [facility] has been consulted (previous RUE AV fistula from 08/2012 clotted). Cr rising since admission 5.72------->6.3-->6.49-->7.01 (today). -will monitor Cr overnight and if remains stable, will discharge tomorrow -likely discharge tomorrow if VVS can place fistula as outpatient  #Hypokalemia -K was 2.7 on admission. Unclear etiology, no GI losses. Mg is wnl. With patient's CKD and recent non-compliance with Lasix would expect elevated K. Young patient with difficult to control blood pressure and low K may indicate hyperaldosteronism, but Na and bicarb are wnl which makes this diagnosis less likely. K has been supplemented multiple times throughout hospitalization. Today K is 3.3. We are stopping lasix today, which will likely help with patient's hypokalemia. If continues to be low, may benefit from outpatient work up for hyperaldosteronism.   #Normocytic anemia: Likely anemia of chronic disease vs CKD given MCV is wnl and anemia panel is normal. Stable.  #DVT ppx - SQ heparin  Dispo: Discharge tomorrow.  The patient does not have a current PCP (No Pcp Per Patient) and does need an [facility] hospital follow-up appointment after discharge.  The patient does not have transportation limitations that  hinder transportation to clinic appointments.  .Services Needed at time of discharge: Y = Yes, Blank = No PT:   OT:  RN:   Equipment:   Other:     LOS: 4 days   Rebecca Eaton, MD 04/23/2013, 7:32 AM

## 2013-04-23 NOTE — Progress Notes (Signed)
[facility] Progress Note    Assessment/ Plan:   1. HTN emergency - Most likely fluid overload. Also consider clonidine withdrawal (though pt states was not taking regularly), hyperaldo, medication noncompliance, renal artery stenosis. Home SBP 170s. - Previously on nitroprusside drip, now off all drips - Continue coreg, hydralazine, clonidine, and amlodipine - Aortic ultrasound with no dissection/aneurysm - BP's markedly improved  2. CKD stage 4 - Cr 5.72 on admission, presently rising unknown baseilne (nephrologist in [place] until recently). 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 No signs uremia or respiratory compromise. - Scr continues to rise slightly each day - Medical management at this time - Consult [facility] for fistula placement - Set up with [facility] for outpatient follow up  - Needs K repletion today - Get records from [place]  3. Anemia: likely of chronic disease as normocytic, iron studies normal - continue to monitor Hgb  5. Acute on chronic diastolic heart failure - EF [id number], grade 2 diastolic dysfunction, presented with dyspnea and orthopnea, LE edema, missing/improper lasix dosing at home, elevated ProBNP - Continue lasix 80mg  PO BID - Continue to monitor Cr   6. Hypokalemia - No reported GI losses. Improving with kdur and lasix. Consider hyperaldo, RTA, rarer salt-wasting nephropathy. Mg WNL. - Monitor & replete PRN - replete today  7. 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    Objective:   BP 118/74  Pulse 75  Temp(Src) 98.4 F (36.9 C) (Oral)  Resp 18  Ht 6' (1.829 m)  Wt 171 lb 11.8 oz (77.9 kg)  BMI 23.29 kg/m2  SpO2 99%  Intake/Output Summary (Last 24 hours) at 04/23/13 0856 Last data filed at 04/23/13  0800  Gross per 24 hour  Intake    723 ml  Output    300 ml  Net    423 ml   Weight change: -0 oz (-0 kg)  Physical Exam: Gen:NAD, pleasant, lying in bed CVS:RRR Resp:CTAB but poor air movement throughout [id number], nondistended Ext: No appreciable lower extremity edema bilaterally no calf tenderness or erythema Neuro: grossly nonfocal, speech intact  Imaging: No results found.  Labs: BMET  Recent Labs Lab 04/19/13 1630 04/19/13 2204 04/20/13 0615 04/20/13 0920 04/21/13 0200 04/22/13 0500 04/23/13 0514  NA 136 136 136 136 134* 136 137  K 2.7* 2.8* 3.2* 3.5 3.4* 3.6 3.3*  CL 98 99 99 98 98 99 99  CO2 24 23 24 27 24 24 25   GLUCOSE 98 98 118* 148* 105* 82 119*  BUN 49* 49* 50* 49* 48* 53* 59*  CREATININE 5.72* 5.95* 6.06* 6.19* 6.30* 6.49* 7.01*  CALCIUM 8.8 8.7 8.5 8.7 8.5 8.2* 8.1*  PHOS  --   --   --   --  6.2* 4.7*  --    CBC  Recent Labs Lab 04/19/13 1630 04/21/13 0200 04/22/13 0500  WBC 10.2 5.0 4.9  HGB 10.2* 8.2* 8.6*  HCT 30.8* 24.7* 25.8*  MCV 78.8 79.4 78.7  PLT 256 189 258     Medications:    . amLODipine  10 mg Oral BID  . aspirin EC  81 mg Oral Daily  . carvedilol  25 mg Oral BID WC  . cloNIDine  0.2 mg Oral TID  . darbepoetin  100 mcg Subcutaneous Q7 days  . feeding supplement  237 mL Oral BID BM  . furosemide  80 mg Oral BID  . heparin  5,000 Units Subcutaneous Q8H  . hydrALAZINE  100 mg Oral Q8H  . pantoprazole  40 mg Oral QHS  . sodium chloride  3 mL Intravenous Q12H  . sodium chloride  3 mL Intravenous Q12H  . Vitamin D (Ergocalciferol)  50,000 Units Oral Q Tue      Leeanne Rio, MD  Family Practice PGY-2 04/23/2013, 8:56 AM  I have seen and examined this patient and agree with plan Dr Ardelia Mems. No uremics Sxs.  PTH high so will start calcitriol.  Scr, not suprisingly, is higher.  VVS to see. I don't think he needs amlodipine BID. K sl low. Will replace. Tashay Bozich T,MD 04/23/2013 10:26 AM

## 2013-04-23 NOTE — Progress Notes (Signed)
  Date: 04/23/2013  Patient name: Patrick Anthony  Medical record number: [id number]  Date of birth: 1971/04/08   This patient has been seen and the plan of care was discussed with the house staff. Please see their note for complete details. I concur with their findings with the following additions/corrections: Mr Ondo is resting comfortably in bed. He feels subjectively better, is ambulatory, eating well, and is without dyspnea. Team will coordinate with renal and vascular regarding timing of the fistula placement.   Bartholomew Crews, MD 04/23/2013, 10:28 AM

## 2013-04-24 DIAGNOSIS — N185 Chronic kidney disease, stage 5: Secondary | ICD-10-CM

## 2013-04-24 LAB — RENAL FUNCTION PANEL
Albumin: 2.6 g/dL — ABNORMAL LOW (ref 3.5–5.2)
BUN: 66 mg/dL — ABNORMAL HIGH (ref 6–23)
CO2: 28 mEq/L (ref 19–32)
Calcium: 8.2 mg/dL — ABNORMAL LOW (ref 8.4–10.5)
Chloride: 97 mEq/L (ref 96–112)
Creatinine, Ser: 7.32 mg/dL — ABNORMAL HIGH (ref 0.50–1.35)
GFR calc Af Amer: 10 mL/min — ABNORMAL LOW (ref >90)
GFR calc non Af Amer: 8 mL/min — ABNORMAL LOW (ref >90)
Glucose, Bld: 98 mg/dL (ref 70–99)
Phosphorus: 4.4 mg/dL (ref 2.3–4.6)
Potassium: 4.4 mEq/L (ref 3.5–5.1)
Sodium: 137 mEq/L (ref 135–145)

## 2013-04-24 LAB — THIOCYANATE, BLOOD
Thiocyanate: 3.8 ug/mL
Thiocyanate: 6.6 ug/mL
Thiocyanate: 14.1 ug/mL

## 2013-04-24 MED ORDER — CALCITRIOL 0.25 MCG PO CAPS: 0.2500 ug | ORAL_CAPSULE | Freq: Every day | ORAL | Status: DC

## 2013-04-24 MED ORDER — AMLODIPINE BESYLATE 10 MG PO TABS: 10.0000 mg | ORAL_TABLET | Freq: Every day | ORAL | Status: DC

## 2013-04-24 MED ORDER — CLONIDINE HCL 0.1 MG PO TABS
0.1000 mg | ORAL_TABLET | Freq: Two times a day (BID) | ORAL | Status: DC
  Administered 2013-04-24: 0.1 mg via ORAL
  Filled 2013-04-24 (×2): qty 1

## 2013-04-24 MED ORDER — CARVEDILOL 25 MG PO TABS: 25.0000 mg | ORAL_TABLET | Freq: Two times a day (BID) | ORAL | Status: DC

## 2013-04-24 MED ORDER — CLONIDINE HCL 0.1 MG PO TABS: 0.1000 mg | ORAL_TABLET | Freq: Two times a day (BID) | ORAL | Status: DC

## 2013-04-24 MED ORDER — FUROSEMIDE 80 MG PO TABS: 80.0000 mg | ORAL_TABLET | Freq: Two times a day (BID) | ORAL | Status: DC

## 2013-04-24 MED ORDER — HYDRALAZINE HCL 100 MG PO TABS: 100.0000 mg | ORAL_TABLET | Freq: Three times a day (TID) | ORAL | Status: DC

## 2013-04-24 NOTE — Progress Notes (Signed)
Subjective: Patient feels at baseline again this morning. No CP, SOB, palpitations. He is ready to go home and agrees to follow up with both nephrology and VVS as scheduled. I spoke with nephrology this morning and they agree with discharge today as long as patient can follow up with them for a lab draw on Monday. VVS will schedule patient for an outpatient fistula placement.  Objective: Vital signs in last 24 hours: Filed Vitals:   04/23/13 1841 04/23/13 2044 04/24/13 0623 04/24/13 0957  BP: 114/74 105/71 122/93 113/74  Pulse: 76 71 68 67  Temp: 97.8 F (36.6 C) 97.8 F (36.6 C) 98.4 F (36.9 C) 97.6 F (36.4 C)  TempSrc: Oral Oral Oral   Resp: 20 20 18 18   Height:  6' (1.829 m)    Weight:  174 lb 11.2 oz (79.243 kg)    SpO2: 100% 100% 100% 97%   Weight change: 2 lb 15.4 oz (1.344 kg)  Intake/Output Summary (Last 24 hours) at 04/24/13 1057 Last data filed at 04/24/13 0957  Gross per 24 hour  Intake   1560 ml  Output    850 ml  Net    710 ml   Physical Exam General: alert, cooperative, and in no apparent distress HEENT: pupils equal round and reactive to light, vision grossly intact, oropharynx clear and non-erythematous  Neck: supple, no JVD Lungs: clear to ascultation bilaterally, no wheezes, rales, rhonchi; chest wall nontender to palpation Heart: regular rate and rhythm, no murmurs, gallops, or rubs Abdomen: soft, nontender, non-distended, normal bowel sounds Extremities: 2+ DP pulses bilaterally, trace BLE edema Neurologic: alert & oriented X3, cranial nerves II-XII intact, strength grossly intact, sensation intact to light touch  Lab Results: Basic Metabolic Panel:  Recent Labs Lab 04/19/13 1630 04/19/13 2204  04/22/13 0500 04/23/13 0514 04/24/13 0507  NA 136 136  < > 136 137 137  K 2.7* 2.8*  < > 3.6 3.3* 4.4  CL 98 99  < > 99 99 97  CO2 24 23  < > 24 25 28   GLUCOSE 98 98  < > 82 119* 98  BUN 49* 49*  < > 53* 59* 66*  CREATININE 5.72* 5.95*  < > 6.49*  7.01* 7.32*  CALCIUM 8.8 8.7  < > 8.2* 8.1* 8.2*  MG  --  2.0  --   --   --   --   PHOS  --   --   < > 4.7*  --  4.4  < > = values in this interval not displayed. Liver Function Tests:  Recent Labs Lab 04/19/13 2204  04/22/13 0500 04/24/13 0507  AST 16  --   --   --   ALT 8  --   --   --   ALKPHOS 50  --   --   --   BILITOT 0.7  --   --   --   PROT 7.1  --   --   --   ALBUMIN 2.7*  < > 2.4* 2.6*  < > = values in this interval not displayed. CBC:  Recent Labs Lab 04/21/13 0200 04/22/13 0500  WBC 5.0 4.9  HGB 8.2* 8.6*  HCT 24.7* 25.8*  MCV 79.4 78.7  PLT 189 258   Cardiac Enzymes:  Recent Labs Lab 04/19/13 2204 04/20/13 0615 04/20/13 1200  TROPONINI <0.30 <0.30 <0.30   BNP:  Recent Labs Lab 04/19/13 1633  PROBNP 34876.0*   Coagulation:  Recent Labs Lab 04/20/13 0615  LABPROT 13.8  INR 1.08   Urine Drug Screen: Drugs of Abuse     Component Value Date/Time   LABOPIA POSITIVE* 04/20/2013 1108   COCAINSCRNUR NONE DETECTED 04/20/2013 1108   LABBENZ NONE DETECTED 04/20/2013 1108   AMPHETMU NONE DETECTED 04/20/2013 1108   THCU NONE DETECTED 04/20/2013 1108   LABBARB NONE DETECTED 04/20/2013 1108    Micro Results: Recent Results (from the past 240 hour(s))  MRSA PCR SCREENING     Status: None   Collection Time    04/20/13  2:42 AM      Result Value Range Status   MRSA by PCR NEGATIVE  NEGATIVE Final   Comment:            The GeneXpert MRSA Assay (FDA     approved for NASAL specimens     only), is one component of a     comprehensive MRSA colonization     surveillance program. It is not     intended to diagnose MRSA     infection nor to guide or     monitor treatment for     MRSA infections.   Studies/Results:  Dg Chest 2 View  04/19/2013   CLINICAL DATA:  Chest pain, headache, dizziness, cough, congestion.  EXAM: CHEST  2 VIEW  COMPARISON:  None.  FINDINGS: Cardiomegaly. Patchy airspace opacities within the lungs including right upper lobe and  both lung bases, left greater than right. Cannot exclude pneumonia. Small left pleural effusion. No acute bony abnormality.  IMPRESSION: Patchy bilateral airspace opacities as above, question pneumonia.  Small left effusion.   Electronically Signed   By: Rolm Baptise M.D.   On: 04/19/2013 17:24   US Aorta  04/20/2013   *RADIOLOGY REPORT*  Clinical Data:  Evaluate for aortic dissection  ULTRASOUND OF ABDOMINAL AORTA  Technique:  Ultrasound examination of the abdominal aorta was performed to evaluate for abdominal aortic aneurysm.  Comparison: None.  Abdominal Aorta:  The aorta is well seen sonographically.  Only a small portion of the mid to distal aorta is obscured by overlying bowel gas.  The aorta is within normal limits in caliber.  No dissection flap identified.        Maximum AP diameter:  2.8 cm proximally       Maximum TRV diameter:  2.5 cm proximally  Right common iliac artery maximal diameter:  1.3 cm Left common iliac artery maximal diameter:  1.3 cm  IMPRESSION:  Mildly ectatic but otherwise unremarkable abdominal aorta without aneurysmal dilatation or visualized dissection flap.   Original Report Authenticated By: Jacqulynn Cadet, M.D.  ECHO: 9/14  - Left ventricle: The cavity size was normal. Wall thickness was increased in a pattern of mild LVH. There was moderate concentric hypertrophy. Systolic function was mildly to moderately reduced. The estimated ejection fraction was in the range of 40% to 45%. Diffuse hypokinesis. Features are consistent with a pseudonormal left ventricular filling pattern, with concomitant abnormal relaxation and increased filling pressure (grade 2 diastolic dysfunction). Doppler parameters are consistent with elevated mean left atrial filling pressure. - Aortic valve: Trileaflet; mildly thickened leaflets. Mild regurgitation. - Mitral valve: Mildly thickened leaflets . Mild regurgitation. - Left atrium: The atrium was moderately dilated. - Atrial septum: No defect  or patent foramen ovale was identified. - Tricuspid valve: Mild-moderate regurgitation. - Pulmonic valve: Mild regurgitation. - Pericardium, extracardiac: A trivial pericardial effusion was identified posterior to the heart.  Medications: I have reviewed the patient's current medications. Scheduled Meds: . amLODipine  10 mg Oral Daily  . aspirin EC  81 mg Oral Daily  . calcitRIOL  0.25 mcg Oral Daily  . carvedilol  25 mg Oral BID WC  . cloNIDine  0.1 mg Oral BID  . [START ON 04/29/2013] darbepoetin  100 mcg Subcutaneous Q Tue-1800  . feeding supplement  237 mL Oral BID BM  . furosemide  80 mg Oral BID  . heparin  5,000 Units Subcutaneous Q8H  . hydrALAZINE  100 mg Oral Q8H  . pantoprazole  40 mg Oral QHS  . potassium chloride  20 mEq Oral Once  . sodium chloride  3 mL Intravenous Q12H  . sodium chloride  3 mL Intravenous Q12H   Continuous Infusions:   PRN Meds:.sodium chloride, acetaminophen, acetaminophen, hydrALAZINE, labetalol, morphine injection, nitroGLYCERIN, sodium chloride  Assessment/Plan:  #Hypertensive emergency - Patient presented with a four day h/o HA, CP, SOB with BP 210-240s/140-150s on admission. CXR showed signs suspicious for early pulmonary edema, which may have caused patient's dyspnea and chest pain. Given his chronically elevated BPs, RUQ abd pain with concurrent renal failure and unknown baseline Cr, we obtained abd [place], which was negative for aortic dissection.  Patient was admitted to ICU and managed initially on NTG drip, but was not responsive to this, so then switched to nitroprusside drip, to which patient responded well. After BP was controlled, patient managed for brief time on cardene drip. Patient was also managed on the following PO medications: coreg 25mg  bid, hydralazine 100mg  TID, clonidine .2mg  TID, amlodipine 10mg  BID, Lasix 80mg  IV. His blood pressures are very well controlled now and he is asymptomatic. Lasix was changed to PO yesterday. Today I  spoke with renal and they are concerned that his blood pressure is slightly too well controlled. Therefore, we will follow their recommendations to decrease amlodipine to 10mg  daily (from BID) and clonidine to .1mg  BID (from .2mg  TID).   -continue above mediations -troponins neg x 3 upon admission -Case management on board- all antihypertensives are on 4 dollar list except amlodipine, for which CM will work on obtaining a coupon  #Acute on chronic systolic and diastolic CHF- Patient was fluid overloaded upon admission, but is not fluid overloaded now. He has been managed on lasix 80mg  IV bid since admission. Echo done 9/14 showed EF 40-45% with grade 2 diastolic dysfunction. -lasix changed to PO yesterday, patient tolerated this well  #CKD G5 - Upon admission, GFR 13 and Cr 5.72. Patient is followed by nephrologist in [place], and we do not know his baseline renal function. Nephrology has been following patient and recommend outpatient follow up. He will need a fistula placed and VVS will place as outpatient next week (previous RUE AV fistula from 08/2012 clotted). Cr rising since admission. 5.72------->6.3-->6.49-->7.01--->7.32 (today). -nephrology agrees with discharge today even with rising creatinine, patient will follow up on Monday at [facility] for a lab draw; he also has appt with Dr. Mercy Moore on Oct 9   #Hypokalemia -K was 2.7 on admission. Unclear etiology, no GI losses. Mg is wnl. With patient's CKD and recent non-compliance with Lasix would expect elevated K. Young patient with difficult to control blood pressure and low K may indicate hyperaldosteronism, but Na and bicarb are wnl which makes this diagnosis less likely. K has been supplemented multiple times throughout hospitalization. We transitioned lasix to PO yesterday, which will likely help with patient's hypokalemia. Today K is 4.4, so I discontined his scheduled supplementation. I will not send patient home with any Kdur given  his CKD and risk of K retention. If potassium is low at a future blood draw, he may benefit from outpatient work up for hyperaldosteronism.    #Normocytic anemia: Likely anemia of chronic disease vs CKD given MCV is wnl and anemia panel is normal. Stable.  #DVT ppx - SQ heparin  Dispo: Discharge today.  The patient does not have a current PCP (No Pcp Per Patient) and does need an [facility] hospital follow-up appointment after discharge.  The patient does not have transportation limitations that hinder transportation to clinic appointments.  .Services Needed at time of discharge: Y = Yes, Blank = No PT:   OT:   RN:   Equipment:   Other:     LOS: 5 days   Rebecca Eaton, MD 04/24/2013, 10:57 AM

## 2013-04-24 NOTE — Progress Notes (Signed)
[facility] Progress Note    Assessment/ Plan:   1. HTN emergency - Most likely fluid overload. Also consider clonidine withdrawal (though pt states was not taking regularly), hyperaldo, medication noncompliance, renal artery stenosis. Home SBP 170s. - Previously on nitroprusside drip, now off all drips - Continue coreg, hydralazine, clonidine, and amlodipine - Aortic ultrasound with no dissection/aneurysm - BP's markedly improved  2. CKD stage 4 - Cr 5.72 on admission, presently rising unknown baseilne (nephrologist in [place] until recently). 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 No signs uremia or respiratory compromise. - Scr continues to rise slightly each day - Good urine output yesterday - Medical management at this time - Consult [facility] - planning as outpatient - Set up with [facility] for outpatient follow up   3. Anemia: likely of chronic disease as normocytic, iron studies normal - continue to monitor Hgb periodically  5. Acute on chronic diastolic heart failure - EF A999333, grade 2 diastolic dysfunction, presented with dyspnea and orthopnea, LE edema, missing/improper lasix dosing at home, elevated ProBNP - Continue lasix 80mg  PO BID - Continue to monitor Cr  6. Hypokalemia - No reported GI losses. Improving with kdur and lasix. Consider hyperaldo, RTA, rarer salt-wasting nephropathy. Mg WNL. 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 Appetite has picked up.    Objective:   BP 122/93  Pulse 68  Temp(Src) 98.4 F (36.9 C) (Oral)  Resp 18  Ht 6' (1.829 m)  Wt 174 lb 11.2 oz (79.243 kg)  BMI 23.69 kg/m2  SpO2 100%  Intake/Output Summary (Last 24 hours) at 04/24/13 0824 Last data filed at 04/24/13 0800  Gross per 24 hour  Intake   1560 ml  Output    850 ml  Net    710 ml   Weight change: 2 lb 15.4 oz (1.344 kg)  Physical Exam: Gen:NAD, pleasant, sitting  up in chair CVS:RRR Resp:CTAB, NWOB Abd: Soft, nondistended Ext: No appreciable lower extremity edema bilaterally Neuro: grossly nonfocal, speech intact  Imaging: No results found.  Labs: BMET  Recent Labs Lab 04/19/13 2204 04/20/13 0615 04/20/13 0920 04/21/13 0200 04/22/13 0500 04/23/13 0514 04/24/13 0507  NA 136 136 136 134* 136 137 137  K 2.8* 3.2* 3.5 3.4* 3.6 3.3* 4.4  CL 99 99 98 98 99 99 97  CO2 23 24 27 24 24 25 28   GLUCOSE 98 118* 148* 105* 82 119* 98  BUN 49* 50* 49* 48* 53* 59* 66*  CREATININE 5.95* 6.06* 6.19* 6.30* 6.49* 7.01* 7.32*  CALCIUM 8.7 8.5 8.7 8.5 8.2* 8.1* 8.2*  PHOS  --   --   --  6.2* 4.7*  --  4.4   CBC  Recent Labs Lab 04/19/13 1630 04/21/13 0200 04/22/13 0500  WBC 10.2 5.0 4.9  HGB 10.2* 8.2* 8.6*  HCT 30.8* 24.7* 25.8*  MCV 78.8 79.4 78.7  PLT 256 189 258     Medications:    . amLODipine  10 mg Oral Daily  . aspirin EC  81 mg Oral Daily  . calcitRIOL  0.25 mcg Oral Daily  . carvedilol  25 mg Oral BID WC  . cloNIDine  0.2 mg Oral TID  . [START ON 04/29/2013] darbepoetin  100 mcg Subcutaneous Q Tue-1800  . feeding supplement  237 mL Oral BID BM  . furosemide  80 mg Oral BID  . heparin  5,000 Units Subcutaneous Q8H  . hydrALAZINE  100 mg Oral Q8H  . pantoprazole  40 mg Oral QHS  . potassium chloride  20 mEq Oral Once  . potassium chloride  40 mEq Oral BID  . sodium chloride  3 mL Intravenous Q12H  . sodium chloride  3 mL Intravenous Q12H      Leeanne Rio, MD  Family Practice PGY-2 04/24/2013, 8:24 AM  I have seen and examined this patient and agree with plan per Dr Ardelia Mems.  Ok to be [place]  He has appt with me 05/15/13 at 3:45pm.  I have arranged labs on Monday.  Note plans for outpt AVF.   I thnk his BP is too well controlled.  Rec decreasing clonidine to .1mg  BID Johnmark Geiger T,MD 04/24/2013 10:29 AM

## 2013-04-24 NOTE — Progress Notes (Signed)
Patient discharged.  IV discontinued.  Prescriptions sent electronically to [facility].  Amlodipine prescription handed to patient.  Care coordinator discussed use of coupon for amlodipine and instructed patient on how to fill medications.  Educated patients on discharge instructions and follow up appointments.  Patient verbalized understanding of how to pick up medications, take medications, and signs and symptoms of heart failure/ kidney failure.  Patient gathered belongings.  Ambulated off unit with ride

## 2013-04-24 NOTE — Care Management Note (Signed)
    Page 1 of 1   04/24/2013     11:51:44 AM   CARE MANAGEMENT NOTE 04/24/2013  Patient:  QUATAVIOUS, GROEBER   Account Number:  [id number]  Date Initiated:  04/21/2013  Documentation initiated by:  Elissa Hefty  Subjective/Objective Assessment:   adm w htn crisis     Action/Plan:   lives w fam   Anticipated DC Date:  04/24/2013   Anticipated DC Plan:  [place]  CM consult      Choice offered to / List presented to:             Status of service:  Completed, signed off Medicare Important Message given?   (If response is "NO", the following Medicare IM given date fields will be blank) Date Medicare IM given:   Date Additional Medicare IM given:    Discharge Disposition:  HOME/SELF CARE  Per UR Regulation:  Reviewed for med. necessity/level of care/duration of stay  If discussed at [place] of Stay Meetings, dates discussed:    Comments:  04/24/13 11:47 Tomi Bamberger RN, BSN [phone] patient  is for possible dc today, patients meds, carvedilol, clonidine, hydralazine, is on the $4 list at [facility].  Patient will also need amlodipine 10mg  , faxed script and discount coupon to [facility] at [facility]. I will check on price and let patient know.  Patient states he has transportation at discharge and if amlodipine is around $7-$9 he should be able to afford.

## 2013-04-24 NOTE — Discharge Summary (Signed)
Name: Patrick Anthony MRN: [id number] DOB: [birth year] 42 y.o. PCP: No Pcp Per Patient  Date of Admission: 04/19/2013  3:47 PM Date of Discharge: 04/24/2013 Attending Physician: Bartholomew Crews, MD  Discharge Diagnosis: Principal Problem:   Hypertensive emergency- 2/2 medication noncompliance +/- CHF and CKD with volume overload Active Problems:   CHF (congestive heart failure)- systolic and diastolic dysfunction   HTN (hypertension)- uncontrolled   CKD (chronic kidney disease), stage V  Discharge Medications:   Medication List         amLODipine 10 MG tablet  Commonly known as:  NORVASC  Take 1 tablet (10 mg total) by mouth daily.     calcitRIOL 0.25 MCG capsule  Commonly known as:  ROCALTROL  Take 1 capsule (0.25 mcg total) by mouth daily.     carvedilol 25 MG tablet  Commonly known as:  COREG  Take 1 tablet (25 mg total) by mouth 2 (two) times daily with a meal.     cloNIDine 0.1 MG tablet  Commonly known as:  CATAPRES  Take 1 tablet (0.1 mg total) by mouth 2 (two) times daily.     furosemide 80 MG tablet  Commonly known as:  LASIX  Take 1 tablet (80 mg total) by mouth 2 (two) times daily.     hydrALAZINE 100 MG tablet  Commonly known as:  APRESOLINE  Take 1 tablet (100 mg total) by mouth every 8 (eight) hours.     nitroGLYCERIN 0.4 MG SL tablet  Commonly known as:  NITROSTAT  Place 0.4 mg under the tongue every 5 (five) minutes as needed for chest pain.     Vitamin D (Ergocalciferol) 50000 UNITS Caps capsule  Commonly known as:  DRISDOL  Take 50,000 Units by mouth every 7 (seven) days. Takes on Tuesday       Disposition and follow-up:   Mr.Aron Elko was discharged from [facility] in Stable condition.  At the hospital follow up visit please address:  1.  CKD stage V- Patient is scheduled to follow up with [facility] for lab draw on Monday 9/22. Patient also to have vascular surgery follow up on week of 04/28/13 for fistula  placement. 2.  Hypokalemia- Patient presented with hypokalemia to 2.7 on admission, which was unexpected given his CKD and recent noncompliance with home lasix. May benefit from further work up for hyperaldosteronism, though Na wnl and pt not acidotic. We supplemented K here, and K 4.4 at time of discharge. 3. HTN with medication noncompliance- thought to be 2/2 financial difficulties; please make sure patient complies with antihypertensive regimen given the recent series of events.  2.  Labs / imaging needed at time of follow-up: BMP, CBC  3.  Pending labs/ test needing follow-up: none  Follow-up Appointments: Follow-up Information   Follow up with [place] at [facility] On 05/13/2013. (Tuesday 8pm)    Specialty:  Sleep Medicine   Contact information:   [place], [place] [id number] [place] [address] [phone]      Follow up with Windy Kalata, MD On 05/15/2013. (3:15pm)    Specialty:  Nephrology   Contact information:   [place] [address] [phone]       Follow up with [facility] On 04/28/2013. (Go in for labwork only- no appointment)    Contact information:   [place] [address] [phone]      Follow up with Tinnie Gens, MD. (An appointment  will be scheduled for you for next week. If you do not hear from them, please give them a call. This is to get dialysis access.)    Specialty:  Vascular Surgery   Contact information:   [place] [address] [phone]       Discharge Instructions: Discharge Orders   Future Appointments Provider Department Dept Phone   05/13/2013 8:00 PM [facility] at [place]   Future Orders Complete By Expires   ([place]) Call MD:  Anytime you have any of the following symptoms: 1) 3 pound weight gain in 24 hours or 5 pounds in 1 week 2) shortness of breath, with or  without a dry hacking cough 3) swelling in the hands, feet or stomach 4) if you have to sleep on extra pillows at night in order to breathe.  As directed    Call MD for:  severe uncontrolled pain  As directed    Call MD for:  temperature >100.4  As directed    Diet - low sodium heart healthy  As directed    Increase activity slowly  As directed       Consultations:   Sol Blazing, MD- Nephrology Mal Misty, MD- Vascular Surgery  Procedures Performed:  Dg Chest 2 View  04/19/2013   CLINICAL DATA:  Chest pain, headache, dizziness, cough, congestion.  EXAM: CHEST  2 VIEW  COMPARISON:  None.  FINDINGS: Cardiomegaly. Patchy airspace opacities within the lungs including right upper lobe and both lung bases, left greater than right. Cannot exclude pneumonia. Small left pleural effusion. No acute bony abnormality.  IMPRESSION: Patchy bilateral airspace opacities as above, question pneumonia.  Small left effusion.   Electronically Signed   By: Rolm Baptise M.D.   On: 04/19/2013 17:24   US Aorta  04/20/2013   *RADIOLOGY REPORT*  Clinical Data:  Evaluate for aortic dissection  ULTRASOUND OF ABDOMINAL AORTA  Technique:  Ultrasound examination of the abdominal aorta was performed to evaluate for abdominal aortic aneurysm.  Comparison: None.  Abdominal Aorta:  The aorta is well seen sonographically.  Only a small portion of the mid to distal aorta is obscured by overlying bowel gas.  The aorta is within normal limits in caliber.  No dissection flap identified.        Maximum AP diameter:  2.8 cm proximally       Maximum TRV diameter:  2.5 cm proximally  Right common iliac artery maximal diameter:  1.3 cm Left common iliac artery maximal diameter:  1.3 cm  IMPRESSION:  Mildly ectatic but otherwise unremarkable abdominal aorta without aneurysmal dilatation or visualized dissection flap.   Original Report Authenticated By: Jacqulynn Cadet, M.D.   ECHO: 9/14  - Left ventricle: The cavity size was normal.  Wall thickness was increased in a pattern of mild LVH. There was moderate concentric hypertrophy. Systolic function was mildly to moderately reduced. The estimated ejection fraction was in the range of 40% to 45%. Diffuse hypokinesis. Features are consistent with a pseudonormal left ventricular filling pattern, with concomitant abnormal relaxation and increased filling pressure (grade 2 diastolic dysfunction). Doppler parameters are consistent with elevated mean left atrial filling pressure. - Aortic valve: Trileaflet; mildly thickened leaflets. Mild regurgitation. - Mitral valve: Mildly thickened leaflets . Mild regurgitation. - Left atrium: The atrium was moderately dilated. - Atrial septum: No defect or patent foramen ovale was identified. - Tricuspid valve: Mild-moderate regurgitation. - Pulmonic valve: Mild  regurgitation. - Pericardium, extracardiac: A trivial pericardial effusion was identified posterior to the heart.  Admission HPI:  Mr. Khalid is a 42 year old male with a PMH of HTN, diastolic HF (EF 123456), HCV, cirrhosis, CKD4 and OSA who moved to [place] from [place] a few weeks ago. He presents to the ED after experiencing SOB and non-radiating pleuritic CP for the past 4 days. The pain is 7/10 and occurs with inspiration. He says he has not slept in 4 days secondary to dyspnea. Mr. Coro also noticed lower extremity edema for the past few weeks. He denies calf pain. He has a cough that started today. He reports missing two days of Lasix earlier this week and has only been taking it once daily (as opposed to BID) for the past 4 days. Sitting up improves his dyspnea and CP. He does not own a scale and has not measured his weight.   Mr. Armada BP was elevated to 210s-240s/120-150s in the ED. His home medications are Coreg 25mg  BID, Lasix 40mg  BID, hydralazine 100mg  q8h, clonidine 0.05mg  TID. He ran out of clonidine and Coreg a few days ago. He complains of headache but denies change in  vision. He denies current drug or alcohol use.   In the ED: nitroglycerin drip was started and Mr. Harvan was transferred to cardiac ICU for titration  Hospital Course by problem list:   #Hypertensive emergency - Patient presented with a four day h/o HA, CP, SOB with BP 210-240s/140-150s. CXR showed signs suspicious for early pulmonary edema, which may have caused patient's dyspnea and chest pain. Given his chronically elevated BPs, RUQ abd pain with concurrent renal failure and unknown baseline Cr, we obtained abd [place], which was negative for aortic dissection. Troponins negative x 3. Patient was admitted to ICU and managed initially on NTG drip, but BP did not respond to this intervention, so patient was switched to nitroprusside drip, which controlled patient's BP well. After BP was controlled, nitroprusside was stopped and patient managed for brief time on cardene drip (less than 12 hours). Patient was also managed on the following PO medications: coreg 25mg  bid, hydralazine 100mg  TID, clonidine .2mg  TID, amlodipine 10mg  BID, Lasix 80mg  IV (in addition to drips). Once drips were off, patient's blood pressure was so well controlled that we changed lasix to PO and decreased amlodipine to once daily and decreased clonidine to .1mg  BID. At time of discharge patient was asymptomatic. His blood pressures are very well controlled now and he is asymptomatic. Patient has disability insurance and is waiting on his medicaid application for [place] to be approved (had medicaid in [place]). Case management spoke with patient and helped him get a coupon to afford the amlodipine, all other medications on the four dollar list. Discussed with patient the importance of medication compliance and following up with both nephrology and vascular surgery.  #Acute on chronic systolic and diastolic CHF- Patient was fluid overloaded upon admission, but this resolved with treatment of his hypertensive emergency. Given patient's h/o  CKD w/ unknown baseline Cr, his volume overload may have also been 2/2 CKD. Patient was not fluid overloaded once he was transferred out of the ICU and remained dry appearing on day of discharge. Pt was managed on lasix 80mg  IV bid for majority of admission, though this was switched to lasix 80mg  PO BID in preparation for discharge. Echo done 9/14 showed EF 40-45% with grade 2 diastolic dysfunction.   #CKD G5 - Upon admission, GFR 13 and Cr 5.72.  Patient is followed by nephrologist in [place], and we do not know his baseline renal function. Nephrology followed patient which he was hospitalized and recommend outpatient follow up. He will need a fistula placed and VVS will place as outpatient next week (previous RUE AV fistula from 08/2012 clotted). Cr increased steadily during his admission, from 5.72 on admission to 7.32 on day of discharge.  since admission. Nephrology agreed with discharge even with rising creatinine. Patient will follow up on Monday 9/22 at [facility] for a lab draw; he also has appt with Dr. Mercy Moore on May 15, 2013.  #Hypokalemia -K was 2.7 on admission. Unclear etiology, no GI losses. Mg was wnl. With patient's CKD and recent non-compliance with Lasix would expect elevated K. Young patient with difficult to control blood pressure and low K may indicate hyperaldosteronism, but Na and bicarb are wnl which makes this diagnosis less likely. K has been supplemented multiple times throughout hospitalization. We transitioned lasix to PO prior to discharge, which will likely help with patient's hypokalemia. Day of discharge, K was 4.4, so I discontined his scheduled supplementation. I did not send patient home with any Kdur given his CKD and risk of K retention. If potassium is low at a future blood draw, he may benefit from outpatient work up for hyperaldosteronism.   #Normocytic anemia: Likely anemia of chronic disease vs 2/2 CKD given MCV is wnl and anemia panel is normal. Stable  throughout hospitalization.   #DVT ppx - SQ heparin  Discharge Vitals:   BP 113/74  Pulse 67  Temp(Src) 97.6 F (36.4 C) (Oral)  Resp 18  Ht 6' (1.829 m)  Wt 174 lb 11.2 oz (79.243 kg)  BMI 23.69 kg/m2  SpO2 97%  Discharge Labs:  Results for orders placed during the hospital encounter of 04/19/13 (from the past 24 hour(s))  RENAL FUNCTION PANEL     Status: Abnormal   Collection Time    04/24/13  5:07 AM      Result Value Range   Sodium 137  135 - 145 mEq/L   Potassium 4.4  3.5 - 5.1 mEq/L   Chloride 97  96 - 112 mEq/L   CO2 28  19 - 32 mEq/L   Glucose, Bld 98  70 - 99 mg/dL   BUN 66 (*) 6 - 23 mg/dL   Creatinine, Ser 7.32 (*) 0.50 - 1.35 mg/dL   Calcium 8.2 (*) 8.4 - 10.5 mg/dL   Phosphorus 4.4  2.3 - 4.6 mg/dL   Albumin 2.6 (*) 3.5 - 5.2 g/dL   GFR calc non Af Amer 8 (*) >90 mL/min   GFR calc Af Amer 10 (*) >90 mL/min    Signed: Rebecca Eaton, MD 04/24/2013, 11:18 AM   Time Spent on Discharge: 35 minutes Services Ordered on Discharge: none Equipment Ordered on Discharge: none

## 2013-04-29 ENCOUNTER — Telehealth: Payer: Self-pay | Admitting: *Deleted

## 2013-04-29 NOTE — Telephone Encounter (Signed)
I called Patrick Anthony everyday starting 9/17 to schedule his surgery for this week per Dr Kellie Simmering. I left a message to call everyday. I finally spoke with Patrick Anthony today and she said Patrick Anthony did not want to have surgery until he discussed with Dr Mercy Moore. She also said he did not want to have it too early. I explained if he had a fistula it would take 12 weeks to mature or he would need a catheter until it was ready to use. She said they knew but the old one didn't mature and he didn't want to start over unless necessary. I told her to call us when they were ready to schedule.

## 2013-04-30 ENCOUNTER — Inpatient Hospital Stay: Payer: Medicaid - Out of State | Admitting: Cardiology

## 2013-04-30 ENCOUNTER — Ambulatory Visit: Payer: Medicaid - Out of State

## 2013-05-13 ENCOUNTER — Ambulatory Visit (HOSPITAL_BASED_OUTPATIENT_CLINIC_OR_DEPARTMENT_OTHER): Payer: Medicaid - Out of State | Attending: Pulmonary Disease

## 2013-05-23 ENCOUNTER — Other Ambulatory Visit (HOSPITAL_COMMUNITY): Payer: Self-pay

## 2013-05-26 ENCOUNTER — Encounter (HOSPITAL_COMMUNITY): Payer: Medicaid Other

## 2013-05-30 ENCOUNTER — Encounter (HOSPITAL_COMMUNITY)
Admission: RE | Admit: 2013-05-30 | Discharge: 2013-05-30 | Disposition: A | Discharge status: Home / Self Care | Payer: Medicaid - Out of State | Source: Ambulatory Visit | Attending: Nephrology | Admitting: Nephrology

## 2013-05-30 VITALS — BP 147/94 | HR 71 | Temp 98.3°F | Resp 18

## 2013-05-30 DIAGNOSIS — D649 Anemia, unspecified: Secondary | ICD-10-CM | POA: Insufficient documentation

## 2013-05-30 DIAGNOSIS — N185 Chronic kidney disease, stage 5: Secondary | ICD-10-CM | POA: Insufficient documentation

## 2013-05-30 LAB — POCT HEMOGLOBIN-HEMACUE
Hemoglobin: 11.7 g/dL — ABNORMAL LOW (ref 13.0–17.0)
Hemoglobin: 11.7 g/dL — ABNORMAL LOW (ref 13.0–17.0)

## 2013-05-30 MED ORDER — EPOETIN ALFA 20000 UNIT/ML IJ SOLN: 20000.0000 [IU] | INTRAMUSCULAR | Status: DC

## 2013-06-13 ENCOUNTER — Encounter (HOSPITAL_COMMUNITY): Payer: Medicaid - Out of State | Attending: Nephrology

## 2013-06-13 DIAGNOSIS — D649 Anemia, unspecified: Secondary | ICD-10-CM | POA: Insufficient documentation

## 2013-06-13 DIAGNOSIS — N185 Chronic kidney disease, stage 5: Secondary | ICD-10-CM | POA: Insufficient documentation

## 2013-07-28 ENCOUNTER — Encounter (HOSPITAL_COMMUNITY)
Admission: RE | Admit: 2013-07-28 | Discharge: 2013-07-28 | Disposition: A | Discharge status: Home / Self Care | Payer: Medicaid - Out of State | Source: Ambulatory Visit | Attending: Nephrology | Admitting: Nephrology

## 2013-07-28 VITALS — BP 155/99 | HR 73 | Temp 97.8°F | Resp 20

## 2013-07-28 DIAGNOSIS — N185 Chronic kidney disease, stage 5: Secondary | ICD-10-CM | POA: Insufficient documentation

## 2013-07-28 LAB — IRON AND TIBC
Iron: 112 ug/dL (ref 42–135)
Saturation Ratios: 38 % (ref 20–55)
TIBC: 296 ug/dL (ref 215–435)
UIBC: 184 ug/dL (ref 125–400)

## 2013-07-28 LAB — FERRITIN: Ferritin: 697 ng/mL — ABNORMAL HIGH (ref 22–322)

## 2013-07-28 LAB — POCT HEMOGLOBIN-HEMACUE: Hemoglobin: 6.7 g/dL — CL (ref 13.0–17.0)

## 2013-07-28 MED ORDER — EPOETIN ALFA 20000 UNIT/ML IJ SOLN
INTRAMUSCULAR | Status: AC
  Administered 2013-07-28: 20000 [IU] via SUBCUTANEOUS
  Filled 2013-07-28: qty 1

## 2013-07-28 MED ORDER — EPOETIN ALFA 20000 UNIT/ML IJ SOLN: 20000.0000 [IU] | INTRAMUSCULAR | Status: DC

## 2013-08-08 ENCOUNTER — Other Ambulatory Visit: Payer: Self-pay | Admitting: *Deleted

## 2013-08-08 DIAGNOSIS — N185 Chronic kidney disease, stage 5: Secondary | ICD-10-CM

## 2013-08-08 DIAGNOSIS — Z01810 Encounter for preprocedural cardiovascular examination: Secondary | ICD-10-CM

## 2013-08-11 ENCOUNTER — Encounter (HOSPITAL_COMMUNITY): Payer: Medicaid Other

## 2013-08-14 ENCOUNTER — Encounter: Payer: Self-pay | Admitting: Vascular Surgery

## 2013-08-15 ENCOUNTER — Inpatient Hospital Stay (HOSPITAL_COMMUNITY): Admission: RE | Admit: 2013-08-15 | Payer: Medicaid - Out of State | Source: Ambulatory Visit

## 2013-08-15 ENCOUNTER — Ambulatory Visit: Payer: Medicaid - Out of State | Admitting: Vascular Surgery

## 2013-08-27 ENCOUNTER — Other Ambulatory Visit (HOSPITAL_COMMUNITY): Payer: Self-pay | Admitting: *Deleted

## 2013-08-28 ENCOUNTER — Encounter (HOSPITAL_COMMUNITY): Payer: Medicaid Other

## 2013-08-28 ENCOUNTER — Encounter (HOSPITAL_COMMUNITY): Payer: Self-pay | Admitting: Emergency Medicine

## 2013-08-28 ENCOUNTER — Emergency Department (HOSPITAL_COMMUNITY): Payer: Medicaid Other

## 2013-08-28 ENCOUNTER — Inpatient Hospital Stay (HOSPITAL_COMMUNITY)
Admission: EM | Admit: 2013-08-28 | Discharge: 2013-09-04 | DRG: 673 | Disposition: A | Discharge status: Home / Self Care | Payer: Medicaid Other | Attending: Internal Medicine | Admitting: Internal Medicine

## 2013-08-28 DIAGNOSIS — K7460 Unspecified cirrhosis of liver: Secondary | ICD-10-CM | POA: Diagnosis present

## 2013-08-28 DIAGNOSIS — I5032 Chronic diastolic (congestive) heart failure: Secondary | ICD-10-CM | POA: Diagnosis present

## 2013-08-28 DIAGNOSIS — I10 Essential (primary) hypertension: Secondary | ICD-10-CM

## 2013-08-28 DIAGNOSIS — R519 Headache, unspecified: Secondary | ICD-10-CM | POA: Diagnosis present

## 2013-08-28 DIAGNOSIS — E872 Acidosis, unspecified: Secondary | ICD-10-CM | POA: Diagnosis present

## 2013-08-28 DIAGNOSIS — I120 Hypertensive chronic kidney disease with stage 5 chronic kidney disease or end stage renal disease: Principal | ICD-10-CM | POA: Diagnosis present

## 2013-08-28 DIAGNOSIS — Y841 Kidney dialysis as the cause of abnormal reaction of the patient, or of later complication, without mention of misadventure at the time of the procedure: Secondary | ICD-10-CM | POA: Diagnosis present

## 2013-08-28 DIAGNOSIS — M949 Disorder of cartilage, unspecified: Secondary | ICD-10-CM

## 2013-08-28 DIAGNOSIS — R079 Chest pain, unspecified: Secondary | ICD-10-CM | POA: Diagnosis present

## 2013-08-28 DIAGNOSIS — D631 Anemia in chronic kidney disease: Secondary | ICD-10-CM | POA: Diagnosis present

## 2013-08-28 DIAGNOSIS — N185 Chronic kidney disease, stage 5: Secondary | ICD-10-CM | POA: Diagnosis present

## 2013-08-28 DIAGNOSIS — Z91199 Patient's noncompliance with other medical treatment and regimen due to unspecified reason: Secondary | ICD-10-CM

## 2013-08-28 DIAGNOSIS — B182 Chronic viral hepatitis C: Secondary | ICD-10-CM | POA: Diagnosis present

## 2013-08-28 DIAGNOSIS — M899 Disorder of bone, unspecified: Secondary | ICD-10-CM | POA: Diagnosis present

## 2013-08-28 DIAGNOSIS — R51 Headache: Secondary | ICD-10-CM | POA: Diagnosis present

## 2013-08-28 DIAGNOSIS — Z992 Dependence on renal dialysis: Secondary | ICD-10-CM | POA: Diagnosis not present

## 2013-08-28 DIAGNOSIS — I129 Hypertensive chronic kidney disease with stage 1 through stage 4 chronic kidney disease, or unspecified chronic kidney disease: Secondary | ICD-10-CM

## 2013-08-28 DIAGNOSIS — N186 End stage renal disease: Secondary | ICD-10-CM

## 2013-08-28 DIAGNOSIS — I161 Hypertensive emergency: Secondary | ICD-10-CM | POA: Diagnosis present

## 2013-08-28 DIAGNOSIS — N039 Chronic nephritic syndrome with unspecified morphologic changes: Secondary | ICD-10-CM

## 2013-08-28 DIAGNOSIS — B1920 Unspecified viral hepatitis C without hepatic coma: Secondary | ICD-10-CM | POA: Diagnosis present

## 2013-08-28 DIAGNOSIS — I509 Heart failure, unspecified: Secondary | ICD-10-CM

## 2013-08-28 DIAGNOSIS — I5043 Acute on chronic combined systolic (congestive) and diastolic (congestive) heart failure: Secondary | ICD-10-CM

## 2013-08-28 DIAGNOSIS — J9600 Acute respiratory failure, unspecified whether with hypoxia or hypercapnia: Secondary | ICD-10-CM | POA: Diagnosis present

## 2013-08-28 DIAGNOSIS — Z9119 Patient's noncompliance with other medical treatment and regimen: Secondary | ICD-10-CM

## 2013-08-28 DIAGNOSIS — N179 Acute kidney failure, unspecified: Secondary | ICD-10-CM | POA: Diagnosis present

## 2013-08-28 DIAGNOSIS — I169 Hypertensive crisis, unspecified: Secondary | ICD-10-CM | POA: Insufficient documentation

## 2013-08-28 DIAGNOSIS — E8770 Fluid overload, unspecified: Secondary | ICD-10-CM | POA: Diagnosis present

## 2013-08-28 DIAGNOSIS — G4733 Obstructive sleep apnea (adult) (pediatric): Secondary | ICD-10-CM | POA: Diagnosis present

## 2013-08-28 DIAGNOSIS — N189 Chronic kidney disease, unspecified: Secondary | ICD-10-CM | POA: Diagnosis present

## 2013-08-28 DIAGNOSIS — I5042 Chronic combined systolic (congestive) and diastolic (congestive) heart failure: Secondary | ICD-10-CM | POA: Diagnosis present

## 2013-08-28 DIAGNOSIS — D649 Anemia, unspecified: Secondary | ICD-10-CM

## 2013-08-28 LAB — CBC WITH DIFFERENTIAL/PLATELET
Basophils Absolute: 0 10*3/uL (ref 0.0–0.1)
Basophils Relative: 0 % (ref 0–1)
Eosinophils Absolute: 0.1 10*3/uL (ref 0.0–0.7)
Eosinophils Relative: 1 % (ref 0–5)
HCT: 20.4 % — ABNORMAL LOW (ref 39.0–52.0)
Hemoglobin: 6.8 g/dL — CL (ref 13.0–17.0)
Lymphocytes Relative: 12 % (ref 12–46)
Lymphs Abs: 0.8 10*3/uL (ref 0.7–4.0)
MCH: 25.2 pg — ABNORMAL LOW (ref 26.0–34.0)
MCHC: 33.3 g/dL (ref 30.0–36.0)
MCV: 75.6 fL — ABNORMAL LOW (ref 78.0–100.0)
Monocytes Absolute: 0.6 10*3/uL (ref 0.1–1.0)
Monocytes Relative: 8 % (ref 3–12)
Neutro Abs: 5.5 10*3/uL (ref 1.7–7.7)
Neutrophils Relative %: 79 % — ABNORMAL HIGH (ref 43–77)
Platelets: 205 10*3/uL (ref 150–400)
RBC: 2.7 MIL/uL — ABNORMAL LOW (ref 4.22–5.81)
RDW: 18.4 % — ABNORMAL HIGH (ref 11.5–15.5)
WBC: 7 10*3/uL (ref 4.0–10.5)

## 2013-08-28 LAB — COMPREHENSIVE METABOLIC PANEL
ALT: 16 U/L (ref 0–53)
AST: 18 U/L (ref 0–37)
Albumin: 3.1 g/dL — ABNORMAL LOW (ref 3.5–5.2)
Alkaline Phosphatase: 35 U/L — ABNORMAL LOW (ref 39–117)
BUN: 114 mg/dL — ABNORMAL HIGH (ref 6–23)
CO2: 15 mEq/L — ABNORMAL LOW (ref 19–32)
Calcium: 8.2 mg/dL — ABNORMAL LOW (ref 8.4–10.5)
Chloride: 101 mEq/L (ref 96–112)
Creatinine, Ser: 15.6 mg/dL — ABNORMAL HIGH (ref 0.50–1.35)
GFR calc Af Amer: 4 mL/min — ABNORMAL LOW (ref >90)
GFR calc non Af Amer: 3 mL/min — ABNORMAL LOW (ref >90)
Glucose, Bld: 91 mg/dL (ref 70–99)
Potassium: 3.4 mEq/L — ABNORMAL LOW (ref 3.7–5.3)
Sodium: 140 mEq/L (ref 137–147)
Total Bilirubin: 0.3 mg/dL (ref 0.3–1.2)
Total Protein: 7.3 g/dL (ref 6.0–8.3)

## 2013-08-28 LAB — URINE MICROSCOPIC-ADD ON

## 2013-08-28 LAB — PREPARE RBC (CROSSMATCH)

## 2013-08-28 LAB — URINALYSIS, ROUTINE W REFLEX MICROSCOPIC
Bilirubin Urine: NEGATIVE
Glucose, UA: 100 mg/dL — AB
Ketones, ur: NEGATIVE mg/dL
Nitrite: NEGATIVE
Protein, ur: >300 mg/dL — AB
Specific Gravity, Urine: 1.017 (ref 1.005–1.030)
Urobilinogen, UA: 0.2 mg/dL (ref 0.0–1.0)
pH: 6 (ref 5.0–8.0)

## 2013-08-28 LAB — ABO/RH: ABO/RH(D): O POS

## 2013-08-28 LAB — LIPID PANEL
Cholesterol: 165 mg/dL (ref 0–200)
HDL: 32 mg/dL — ABNORMAL LOW (ref >39)
LDL Cholesterol: 108 mg/dL — ABNORMAL HIGH (ref 0–99)
Total CHOL/HDL Ratio: 5.2 RATIO
Triglycerides: 126 mg/dL (ref <150)
VLDL: 25 mg/dL (ref 0–40)

## 2013-08-28 LAB — TROPONIN I: Troponin I: <0.3 ng/mL (ref <0.30)

## 2013-08-28 LAB — PRO B NATRIURETIC PEPTIDE: Pro B Natriuretic peptide (BNP): 40974 pg/mL — ABNORMAL HIGH (ref 0–125)

## 2013-08-28 LAB — MRSA PCR SCREENING: MRSA by PCR: NEGATIVE

## 2013-08-28 IMAGING — CR DG CHEST 2V
2 series · 2 of 2 positions shown · non-contrast
Comparison: PA and lateral chest [DATE].

CLINICAL DATA: Chest pain and shortness of breath.

EXAM:
CHEST  2 VIEW

[w chest pa]
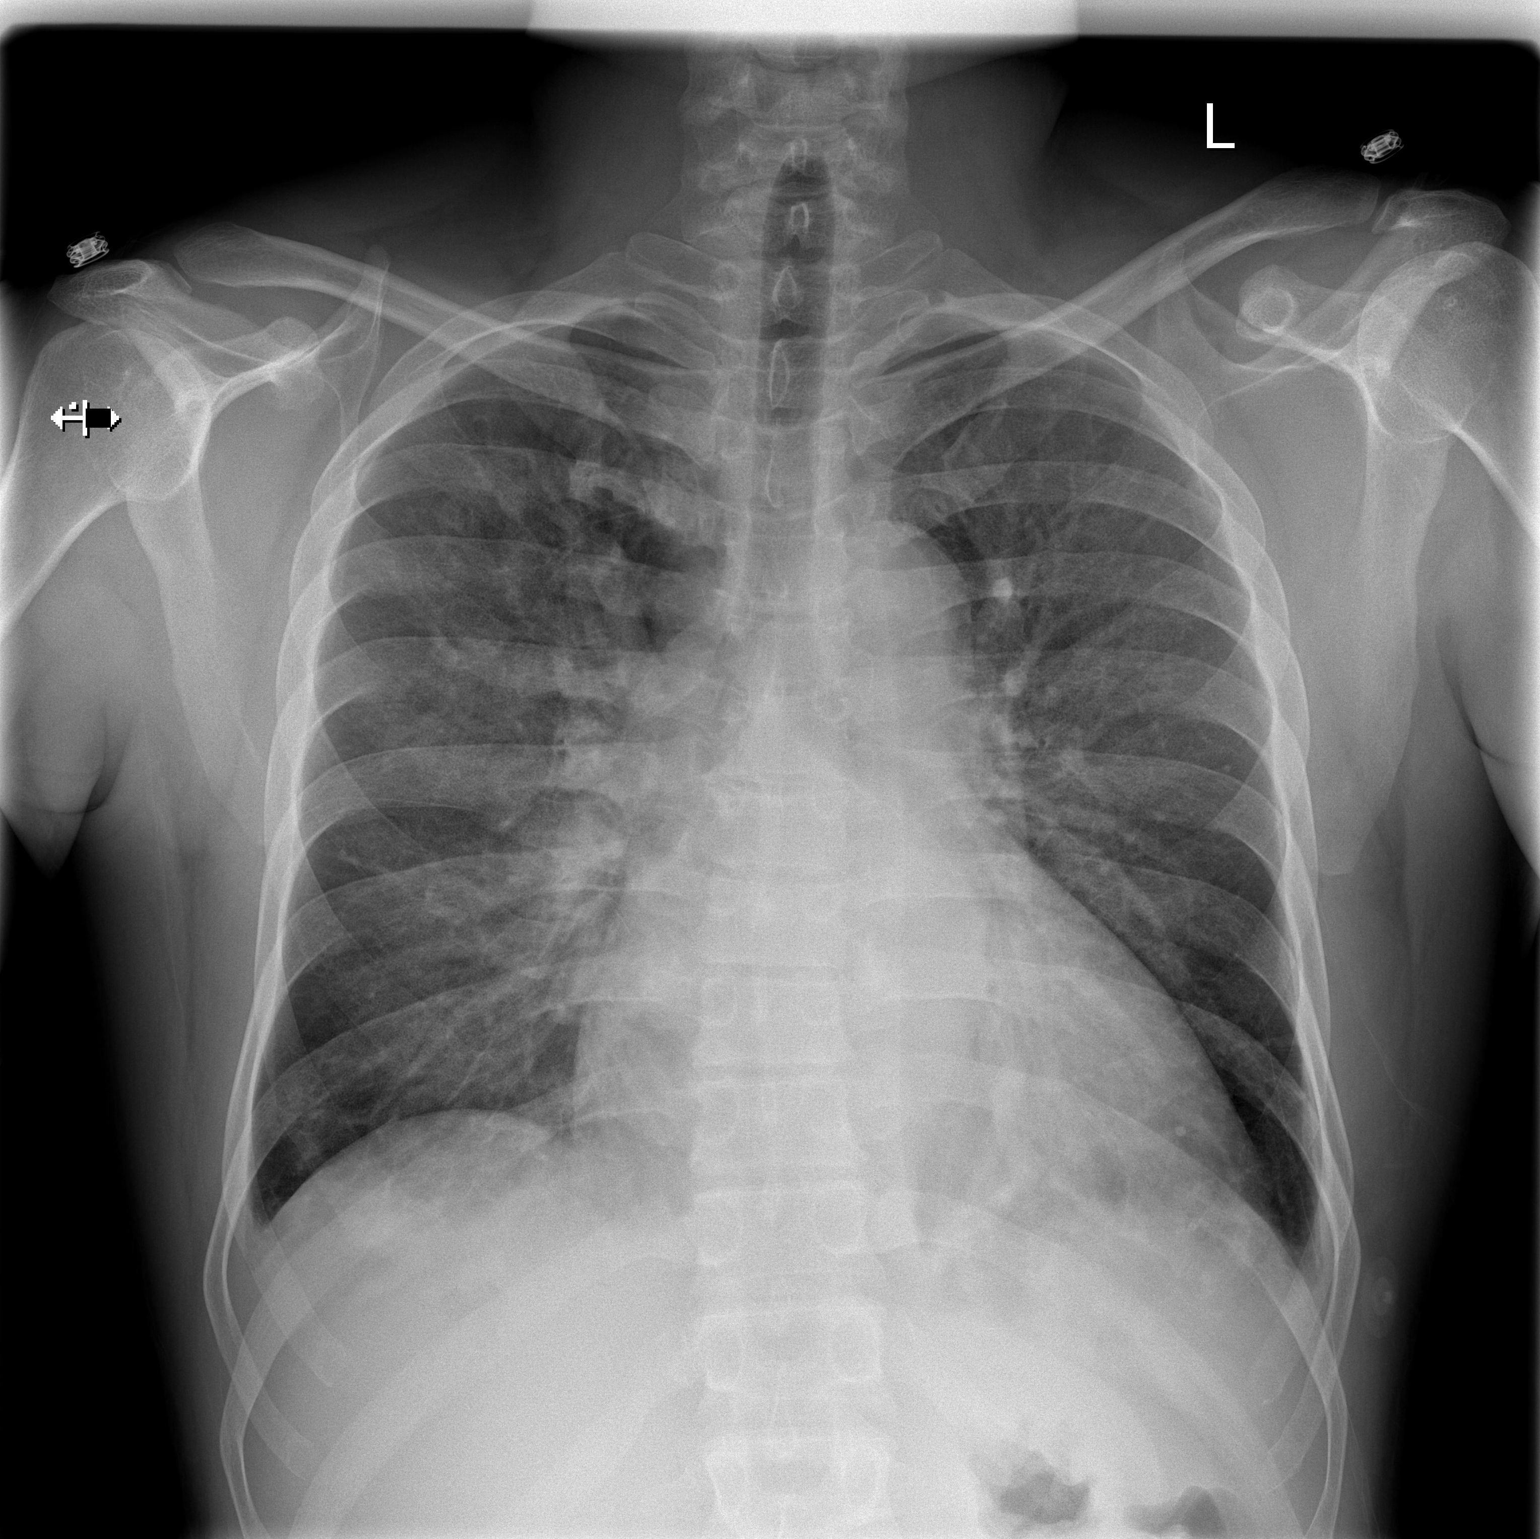

[w chest lat]
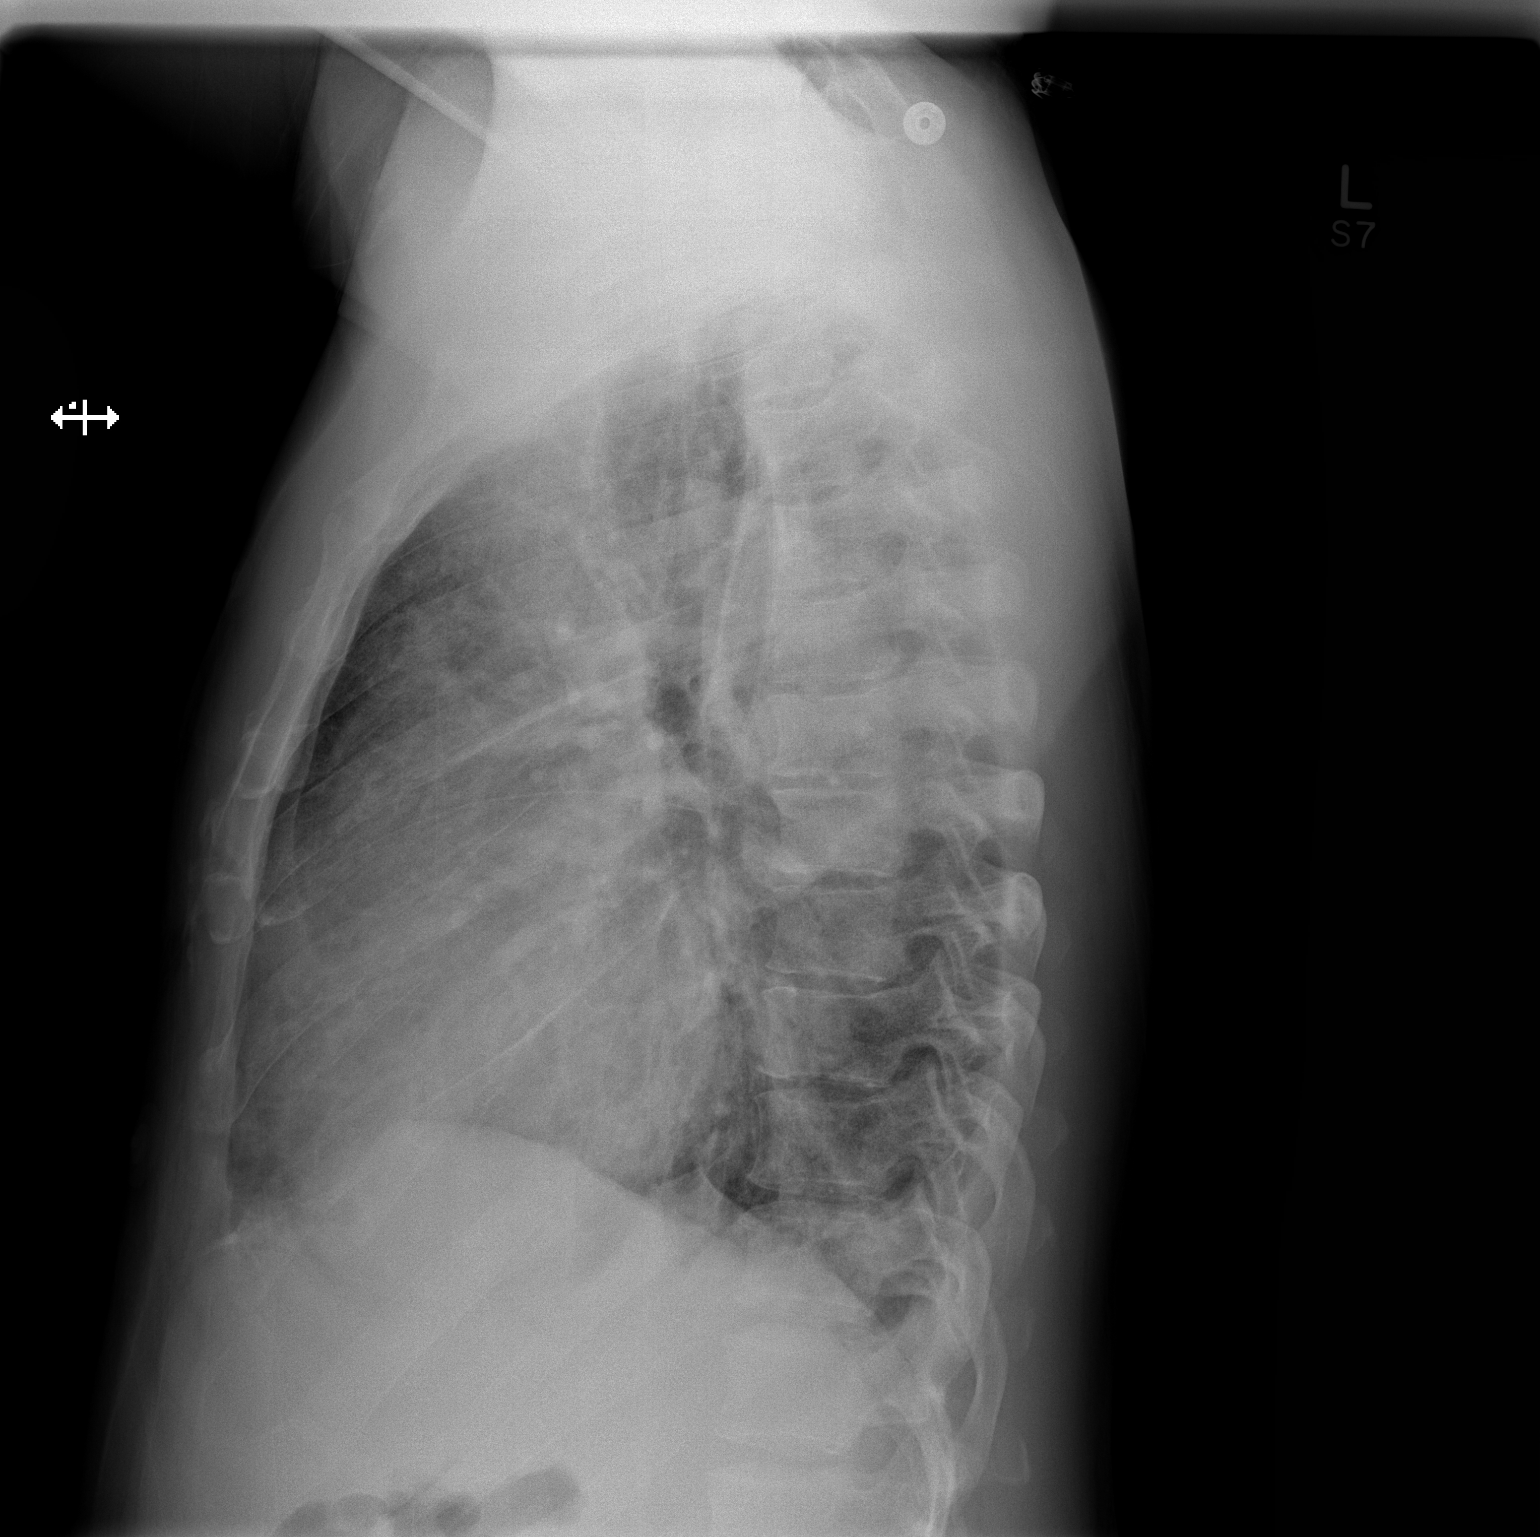

[2 of 2 positions shown; findings below may reference images not displayed]

FINDINGS: There is cardiomegaly with hazy bilateral perihilar opacities. No
pneumothorax or pleural fluid.
IMPRESSION: Cardiomegaly with bilateral perihilar opacities which could be due
to pulmonary edema or pneumonia.

## 2013-08-28 MED ORDER — HYDRALAZINE HCL 50 MG PO TABS
100.0000 mg | ORAL_TABLET | Freq: Three times a day (TID) | ORAL | Status: DC
  Filled 2013-08-28: qty 2

## 2013-08-28 MED ORDER — AMLODIPINE BESYLATE 10 MG PO TABS
10.0000 mg | ORAL_TABLET | Freq: Every day | ORAL | Status: DC
  Administered 2013-08-29: 10 mg via ORAL
  Filled 2013-08-28: qty 1

## 2013-08-28 MED ORDER — NITROGLYCERIN IN D5W 200-5 MCG/ML-% IV SOLN
5.0000 ug/min | INTRAVENOUS | Status: DC
  Administered 2013-08-28: 5 ug/min via INTRAVENOUS
  Administered 2013-08-29: 50 ug/min via INTRAVENOUS
  Filled 2013-08-28 (×2): qty 250

## 2013-08-28 MED ORDER — CARVEDILOL 25 MG PO TABS
25.0000 mg | ORAL_TABLET | Freq: Two times a day (BID) | ORAL | Status: DC
  Administered 2013-08-29: 25 mg via ORAL
  Filled 2013-08-28 (×3): qty 1

## 2013-08-28 MED ORDER — AMLODIPINE BESYLATE 10 MG PO TABS
10.0000 mg | ORAL_TABLET | Freq: Every day | ORAL | Status: DC
  Filled 2013-08-28: qty 1

## 2013-08-28 MED ORDER — RENA-VITE PO TABS
1.0000 | ORAL_TABLET | Freq: Every day | ORAL | Status: DC
  Filled 2013-08-28: qty 1

## 2013-08-28 MED ORDER — CLONIDINE HCL 0.1 MG PO TABS
0.1000 mg | ORAL_TABLET | Freq: Once | ORAL | Status: AC
  Administered 2013-08-28: 0.1 mg via ORAL
  Filled 2013-08-28: qty 1

## 2013-08-28 MED ORDER — HYDRALAZINE HCL 20 MG/ML IJ SOLN
INTRAMUSCULAR | Status: AC
  Administered 2013-08-28: 20 mg
  Filled 2013-08-28: qty 1

## 2013-08-28 MED ORDER — HEPARIN SODIUM (PORCINE) 5000 UNIT/ML IJ SOLN
5000.0000 [IU] | Freq: Three times a day (TID) | INTRAMUSCULAR | Status: DC
  Filled 2013-08-28 (×2): qty 1

## 2013-08-28 MED ORDER — POTASSIUM CHLORIDE CRYS ER 20 MEQ PO TBCR: 40.0000 meq | EXTENDED_RELEASE_TABLET | Freq: Once | ORAL | Status: DC

## 2013-08-28 MED ORDER — CALCITRIOL 0.25 MCG PO CAPS
0.2500 ug | ORAL_CAPSULE | Freq: Every day | ORAL | Status: DC
  Administered 2013-08-29: 0.25 ug via ORAL
  Filled 2013-08-28: qty 1

## 2013-08-28 MED ORDER — FUROSEMIDE 10 MG/ML IJ SOLN
200.0000 mg | Freq: Four times a day (QID) | INTRAVENOUS | Status: DC
  Administered 2013-08-28 – 2013-08-29 (×3): 200 mg via INTRAVENOUS
  Filled 2013-08-28 (×7): qty 20

## 2013-08-28 MED ORDER — CALCITRIOL 0.25 MCG PO CAPS
0.2500 ug | ORAL_CAPSULE | Freq: Every day | ORAL | Status: DC
  Filled 2013-08-28: qty 1

## 2013-08-28 MED ORDER — CLONIDINE HCL 0.2 MG PO TABS
0.2000 mg | ORAL_TABLET | Freq: Two times a day (BID) | ORAL | Status: DC
  Administered 2013-08-28 – 2013-09-04 (×13): 0.2 mg via ORAL
  Filled 2013-08-28 (×10): qty 1
  Filled 2013-08-28: qty 2
  Filled 2013-08-28 (×5): qty 1

## 2013-08-28 MED ORDER — NICARDIPINE HCL IN NACL 20-0.86 MG/200ML-% IV SOLN
3.0000 mg/h | INTRAVENOUS | Status: DC
  Filled 2013-08-28: qty 200

## 2013-08-28 MED ORDER — HYDRALAZINE HCL 50 MG PO TABS
100.0000 mg | ORAL_TABLET | Freq: Four times a day (QID) | ORAL | Status: DC
  Administered 2013-08-28 – 2013-08-29 (×3): 100 mg via ORAL
  Filled 2013-08-28 (×7): qty 2

## 2013-08-28 MED ORDER — CARVEDILOL 25 MG PO TABS
25.0000 mg | ORAL_TABLET | Freq: Two times a day (BID) | ORAL | Status: DC
  Filled 2013-08-28 (×2): qty 1

## 2013-08-28 MED ORDER — HYDRALAZINE HCL 50 MG PO TABS
100.0000 mg | ORAL_TABLET | Freq: Three times a day (TID) | ORAL | Status: DC
  Filled 2013-08-28 (×2): qty 2

## 2013-08-28 MED ORDER — RENA-VITE PO TABS
1.0000 | ORAL_TABLET | Freq: Every day | ORAL | Status: DC
  Administered 2013-08-29 – 2013-09-03 (×6): 1 via ORAL
  Filled 2013-08-28 (×7): qty 1

## 2013-08-28 NOTE — ED Notes (Signed)
MD at bedside. 

## 2013-08-28 NOTE — ED Notes (Addendum)
Pt is aware of delay, informed dr Vanita Panda of pts situations and vital signs, pt alert and oriented, he states ok for pt to be taken for procedure.

## 2013-08-28 NOTE — ED Notes (Signed)
Questioned pt more, he states he became more sob last night, called his dr and they sent him here today to go ahead and place dialysis access instead of doing it next week as previously scheduled. Pt was mistakenly admitted to ED, registration aware.

## 2013-08-28 NOTE — ED Notes (Signed)
Attempted to call report to 3S RN, was told in another pt room and will call back in 2 mins.

## 2013-08-28 NOTE — ED Provider Notes (Signed)
CSN: [id number]     Arrival date & time 08/28/13  1218 History   First MD Initiated Contact with Patient 08/28/13 1250     Chief Complaint  Patient presents with  . Shortness of Breath    HPI Dyspnea and palpitations. Patient notes symptoms began progressive over the past week, worse over the past night. There is mild associated lightheadedness, no fever, no chills. There is associated nausea with vomiting, no diarrhea. Patient notes that he has not been compliant with medication due to access issues. Patient was scheduled for post of dialysis catheter next week.  With the new dyspnea, his physician advised him to be evaluated here.    Past Medical History  Diagnosis Date  . Hypertension   . CHF (congestive heart failure)   . Renal disorder renal failure  . Cirrhosis of liver   . Drug addiction   . ETOH abuse   . Sleep apnea   . Hepatitis    Past Surgical History  Procedure Laterality Date  . Av fistula placement Right    History reviewed. No pertinent family history. History  Substance Use Topics  . Smoking status: Never Smoker   . Smokeless tobacco: Not on file  . Alcohol Use: Yes    Review of Systems  Constitutional:       Per HPI, otherwise negative  HENT:       Per HPI, otherwise negative  Respiratory:       Per HPI, otherwise negative  Cardiovascular:       Per HPI, otherwise negative  Gastrointestinal: Negative for vomiting.  Endocrine:       Negative aside from HPI  Genitourinary:       Neg aside from HPI   Musculoskeletal:       Per HPI, otherwise negative  Skin: Negative.   Neurological: Negative for syncope.    Allergies  Review of patient's allergies indicates no known allergies.  Home Medications   Current Outpatient Rx  Name  Route  Sig  Dispense  Refill  . amLODipine (NORVASC) 10 MG tablet   Oral   Take 1 tablet (10 mg total) by mouth daily.   30 tablet   4   . calcitRIOL (ROCALTROL) 0.25 MCG capsule   Oral   Take 1 capsule  (0.25 mcg total) by mouth daily.   30 capsule   4   . carvedilol (COREG) 25 MG tablet   Oral   Take 1 tablet (25 mg total) by mouth 2 (two) times daily with a meal.   60 tablet   4   . cloNIDine (CATAPRES) 0.1 MG tablet   Oral   Take 1 tablet (0.1 mg total) by mouth 2 (two) times daily.   60 tablet   11   . furosemide (LASIX) 80 MG tablet   Oral   Take 1 tablet (80 mg total) by mouth 2 (two) times daily.   60 tablet   4   . hydrALAZINE (APRESOLINE) 100 MG tablet   Oral   Take 1 tablet (100 mg total) by mouth every 8 (eight) hours.   90 tablet   4   . nitroGLYCERIN (NITROSTAT) 0.4 MG SL tablet   Sublingual   Place 0.4 mg under the tongue every 5 (five) minutes as needed for chest pain.         . Vitamin D, Ergocalciferol, (DRISDOL) 50000 UNITS CAPS capsule   Oral   Take 50,000 Units by mouth every 7 (seven) days. Takes on  Tuesday          BP 228/150  Pulse 83  Temp(Src) 98.1 F (36.7 C) (Oral)  Resp 18  Ht 6' (1.829 m)  SpO2 94% Physical Exam  Nursing note and vitals reviewed. Constitutional: He is oriented to person, place, and time. He appears well-developed. No distress.  HENT:  Head: Normocephalic and atraumatic.  Eyes: Conjunctivae and EOM are normal.  Cardiovascular: Normal rate and regular rhythm.   Pulmonary/Chest: Effort normal. No stridor. No respiratory distress.  Abdominal: He exhibits no distension.  Musculoskeletal: He exhibits no edema.  Neurological: He is alert and oriented to person, place, and time.  Skin: Skin is warm and dry.  Psychiatric: He has a normal mood and affect.    ED Course  Procedures (including critical care time) Labs Review Labs Reviewed  CBC WITH DIFFERENTIAL  COMPREHENSIVE METABOLIC PANEL  URINALYSIS, ROUTINE W REFLEX MICROSCOPIC  PRO B NATRIURETIC PEPTIDE   Imaging Review No results found.  EKG Interpretation    Date/Time:  Thursday August 28 2013 12:22:51 EST Ventricular Rate:  97 PR  Interval:  202 QRS Duration: 92 QT Interval:  370 QTC Calculation: 469 R Axis:   34 Text Interpretation:  Normal sinus rhythm Possible Left atrial enlargement Left ventricular hypertrophy with repolarization abnormality Abnormal ECG Sinus rhythm T wave abnormality peaked T waves Abnormal ekg Confirmed by Carmin Muskrat  MD ([id number]) on 08/28/2013 12:51:22 PM           Initial blood pressure noted to be 228/150.  After initial intervention the patient's blood pressure is not substantially different. With ongoing dyspnea, patient will receive nitroglycerin drip.  Update: Labs notable for no significant potassium abnormality, but for anemia and doubling of creatinine.  With anemia, patient will receive transfusion.  Update: I discussed the patient's case with our nephrology team, Dr. Lorrene Reid, and then with our Internal Medicine colleagues for admission.   MDM   1. Hypertensive emergency   2. CHF (congestive heart failure)   3. Acute kidney injury    This patient presents with dyspnea, hypertension.  Notably, the patient was preparing for initiation of dialysis, but over the past days has become more uncomfortable.  On initial exam the patient is dyspneic, hypertensive.  Patient's evaluation demonstrates progression of renal dysfunction, with evidence of hypertensive crisis.  With this concern the patient was started on a nitroglycerin drip, received a transfusion and was admitted to a step down unit for further evaluation and management.  CRITICAL CARE Performed by: Carmin Muskrat Total critical care time: 40 Critical care time was exclusive of separately billable procedures and treating other patients. Critical care was necessary to treat or prevent imminent or life-threatening deterioration. Critical care was time spent personally by me on the following activities: development of treatment plan with patient and/or surrogate as well as nursing, discussions with consultants,  evaluation of patient's response to treatment, examination of patient, obtaining history from patient or surrogate, ordering and performing treatments and interventions, ordering and review of laboratory studies, ordering and review of radiographic studies, pulse oximetry and re-evaluation of patient's condition.     Carmin Muskrat, MD 08/28/13 [phone]

## 2013-08-28 NOTE — ED Notes (Signed)
Pt reports that he was suppose to have himself checked in for placement of dialysis access but came to ED instead due to increase in sob. ekg done at triage. Airway is intact.

## 2013-08-28 NOTE — Progress Notes (Addendum)
    Pt known to Dr. Kellie Simmering from consult on 04/19/13.  Pt is being readmitted for hypertensive crisis.  Per Dr. Lorrene Reid, pt now needs [facility] placement for HD.  Will try to schedule patient for [facility] placement tomorrow, dependent on OR time being available.  Ideally, pt should be scheduled for L arm access placement also, but he has been unwilling to proceed in the past.  Adele Barthel, MD [facility] Office: [phone] Pager: [phone]  08/28/2013, 5:12 PM  Addendum  Appears Dr. Florene Glen has deferred the [facility] to IR, so will cancel OR placement of TDC.  Too late in day to determine when left access can be placed.  Adele Barthel, MD [facility] [place] Office: [phone] Pager: [phone]  08/28/2013, 5:44 PM

## 2013-08-28 NOTE — Progress Notes (Signed)
Report called to [facility] RN.   Pt will be transported by night shift. All belongings to be sent with patient. eICU and CCMT notified of transfer. Transferred by bed by staff members, on 4 liters oxygen per nasal cannula.

## 2013-08-28 NOTE — Consult Note (Signed)
Name: Patrick Anthony MRN: [id number] DOB: 1971-07-17    ADMISSION DATE:  08/28/2013 CONSULTATION DATE:  08/28/13  REFERRING MD :  Dr Inez Catalina PRIMARY SERVICE: IMTS  CHIEF COMPLAINT:  Dyspnea, HA, chest discomfort  BRIEF PATIENT DESCRIPTION: 43 yo man, severe HTN, chronic renal failure nearing HD, presented for LUE AV fistula and tunneled HD cath placement 1/22. Noted to have BP's 230's/140's and procedure deferred. Admitted to SDU by IMTS for stabilization on NTG gtt. PCCM asked to assist w his care.   SIGNIFICANT EVENTS / STUDIES:  Failed R brachiocephalic fistula, [place] (tied off due to severe edema)   LINES / TUBES:  CULTURES: Urine 1/22 >>   ANTIBIOTICS: none  HISTORY OF PRESENT ILLNESS:  43 yo man, severe HTN, diastolic CHF, Hep C with cirrhosis, chronic renal failure nearing HD (Dr Mercy Moore), presented for LUE AV fistula and tunneled HD cath placement 1/22. He describes dyspnea x 3 days, some malaise, nausea, weakness. Noted to have BP's 230's/140's and vascular procedure deferred. Has evolved some chest discomfort, HA without neuro changes. Admitted to SDU by IMTS for stabilization on NTG gtt. PCCM asked 1/22 to assist w his care.   PAST MEDICAL HISTORY :  Past Medical History  Diagnosis Date  . Hypertension   . CHF (congestive heart failure)   . Renal disorder renal failure  . Cirrhosis of liver   . Drug addiction   . ETOH abuse   . Sleep apnea   . Hepatitis    Past Surgical History  Procedure Laterality Date  . Av fistula placement Right    Prior to Admission medications   Medication Sig Start Date End Date Taking? Authorizing Provider  amLODipine (NORVASC) 10 MG tablet Take 10 mg by mouth daily.   Yes Historical Provider, MD  calcitRIOL (ROCALTROL) 0.25 MCG capsule Take 0.25 mcg by mouth daily.   Yes Historical Provider, MD  carvedilol (COREG) 25 MG tablet Take 25 mg by mouth daily.   Yes Historical Provider, MD  carvedilol (COREG) 25 MG tablet Take  25 mg by mouth daily. 04/24/13 08/28/13 Yes Rebecca Eaton, MD  cloNIDine (CATAPRES) 0.1 MG tablet Take 0.1 mg by mouth 2 (two) times daily.   Yes Historical Provider, MD  furosemide (LASIX) 40 MG tablet Take 40 mg by mouth daily.   Yes Historical Provider, MD  hydrALAZINE (APRESOLINE) 100 MG tablet Take 100 mg by mouth 2 (two) times daily.   Yes Historical Provider, MD  Multiple Vitamins-Minerals (MULTIVITAMIN WITH MINERALS) tablet Take 1 tablet by mouth daily.   Yes Historical Provider, MD  Vitamin D, Ergocalciferol, (DRISDOL) 50000 UNITS CAPS capsule Take 50,000 Units by mouth every 7 (seven) days. Takes on Tuesday   Yes Historical Provider, MD  nitroGLYCERIN (NITROSTAT) 0.4 MG SL tablet Place 0.4 mg under the tongue every 5 (five) minutes as needed for chest pain.    Historical Provider, MD   No Known Allergies  FAMILY HISTORY:  History reviewed. No pertinent family history. SOCIAL HISTORY:  reports that he has never smoked. He does not have any smokeless tobacco history on file. He reports that he drinks alcohol. He reports that he does not use illicit drugs.  REVIEW OF SYSTEMS:  Chest discomfort, squeezing pain, progressive SOB x 3 days. HA without visual changes. No F/C/sweats or infectious prodrome  SUBJECTIVE:  Feels weak, some dyspnea even at rest  VITAL SIGNS: Temp:  [98.1 F (36.7 C)] 98.1 F (36.7 C) (01/22 1226) Pulse Rate:  [74-84] 84 (01/22  1645) Resp:  [18-25] 19 (01/22 1645) BP: (204-228)/(135-150) 226/141 mmHg (01/22 1645) SpO2:  [94 %-100 %] 100 % (01/22 1645) HEMODYNAMICS:   VENTILATOR SETTINGS:   INTAKE / OUTPUT: Intake/Output   None     PHYSICAL EXAMINATION: General:  Awake, alert and appropriate, comfortable Neuro:  Non-focal exam HEENT:  Op clear, PERRL Cardiovascular:  2/6 flow M, regular Lungs:  B soft fine crackles Abdomen:  Soft. NT + BS Musculoskeletal: no lesions, old scars on chest and UE's Skin:  No rash  LABS:  CBC  Recent Labs Lab  08/28/13 1253  WBC 7.0  HGB 6.8*  HCT 20.4*  PLT 205   Coag's No results found for this basename: APTT, INR,  in the last 168 hours BMET  Recent Labs Lab 08/28/13 1253  NA 140  K 3.4*  CL 101  CO2 15*  BUN 114*  CREATININE 15.60*  GLUCOSE 91   Electrolytes  Recent Labs Lab 08/28/13 1253  CALCIUM 8.2*   Sepsis Markers No results found for this basename: LATICACIDVEN, PROCALCITON, O2SATVEN,  in the last 168 hours ABG No results found for this basename: PHART, PCO2ART, PO2ART,  in the last 168 hours Liver Enzymes  Recent Labs Lab 08/28/13 1253  AST 18  ALT 16  ALKPHOS 35*  BILITOT 0.3  ALBUMIN 3.1*   Cardiac Enzymes  Recent Labs Lab 08/28/13 1253  PROBNP 40974.0*   Glucose No results found for this basename: GLUCAP,  in the last 168 hours  Imaging Dg Chest 2 View  08/28/2013   CLINICAL DATA:  Chest pain and shortness of breath.  EXAM: CHEST  2 VIEW  COMPARISON:  PA and lateral chest 04/19/2013.  FINDINGS: There is cardiomegaly with hazy bilateral perihilar opacities. No pneumothorax or pleural fluid.  IMPRESSION: Cardiomegaly with bilateral perihilar opacities which could be due to pulmonary edema or pneumonia.   Electronically Signed   By: Inge Rise M.D.   On: 08/28/2013 13:49    CXR:  1/22 >> B interstitial infiltrates with hilar predominance consistent with cardiogenic edema  ASSESSMENT / PLAN:  PULMONARY A: Acute hypoxemic resp failure due to pulmonary edema P:   - agree with afterload reduction although NTG gtt appears to be having little effect at this time.  - wean O2 as able, not currently in distress   CARDIOVASCULAR A: Severe malignant HTN. Progressively worsening as outpatient with BP 190/142 on 1/20, exacerbated by inability to obtain several BP meds x 2 weeks Diastolic dysfxn with acute diastolic CHF RUE venous HTN and failed AV fistula placement P:  - diuresis if possible, expect he will require HD 1/22 or 1/23 -  controlled BP control with goal SBP 180 - afterload reduction when stabilized - should be able to tolerate hydralazine x1 > given - given chronicity of his HTN and the presence of end-organ damage will tightly control and slowly/steadily decrease with nicardipene to goal SBP 180.  - hold PO BP meds tonight, plan restart 1/23 and wean nicardipene to off - VVS planning perm cath for am 1/23. AV fistula likely on hold for now   RENAL A:  Acute on chronic renal failure, uremia, non-oliguric Acute metabolic acidosis, due to uremia P:   - will require HD, planned for 1/23 - note lasix gtt initiated by Dr Florene Glen - follow electrolytes  GASTROINTESTINAL A:  HCV positive Cirrhosis  P:   - consider viral load assessment, RUQ [place] to confirm cirrhosis - NPO until SBP at goal, then will give  renal diet (NPO after mn for HD catheter in am 1/23)  HEMATOLOGIC A:  Anemia of chronic disease, possible volume overload P:  - PRBC have been ordered but I would like to defer for now. He will tolerate the volume poorly and transfusion will increase BP  INFECTIOUS A:  No evidence active infxn P:   - follow urine cx and clinical status  ENDOCRINE A:  No acute issues  P:    NEUROLOGIC A:  HA  P:   - follow neuro status as BP is reduced  TODAY'S SUMMARY:   I have personally obtained a history, examined the patient, evaluated laboratory and imaging results, formulated the assessment and plan and placed orders.  CRITICAL CARE: The patient is critically ill with multiple organ systems failure and requires high complexity decision making for assessment and support, frequent evaluation and titration of therapies, application of advanced monitoring technologies and extensive interpretation of multiple databases. Critical Care Time devoted to patient care services described in this note is 60 minutes.    Baltazar Apo, MD, PhD 08/28/2013, 6:22 PM [facility] [phone] or if no  answer [phone]
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
## 2013-08-28 NOTE — Progress Notes (Signed)
Internal Medicine Attending Admission Note Date: 08/28/2013  Patient name: Patrick Anthony Medical record number: [id number] Date of birth: [birth year] Age: 43 y.o. Gender: male  I saw and evaluated the patient along with resident Dr. Eula Fried in the 3S SDU prior to his transfer to ICU service; see her note for details of history, clinical findings, and plans.     Chief Complaint(s): Shortness of breath, chest pain, headache  History - key components related to admission: Patient is a 43 year old man with history of HTN, diastolic CHF, HCV and cirrhosis, CKD stage 4, and other problems as outlined in the medical history, admitted with above complaints which have progressed over the past 3 days.  Patient reports that he ran out of his blood pressure medications 1-2 weeks ago.   Physical Exam - key components related to admission:  Filed Vitals:   08/28/13 1855 08/28/13 1930 08/28/13 1940 08/28/13 1945  BP: 212/132 193/129 191/118 191/121  Pulse: 96 79 81 77  Temp:      TempSrc:      Resp: 25 18 20 16   Height:      Weight:      SpO2: 97% 97% 99% 99%    General: Alert, oriented Lungs: Clear Heart: regular; no gallop or murmur Abdominal: BS+, soft, NT Extremities: 1+ bilateral pitting lower extremity edema  Lab results:   Basic Metabolic Panel:  Recent Labs  08/28/13 1253  NA 140  K 3.4*  CL 101  CO2 15*  GLUCOSE 91  BUN 114*  CREATININE 15.60*  CALCIUM 8.2*    Creatinine, Ser  Date Value Range Status  08/28/2013 15.60* 0.50 - 1.35 mg/dL Final  04/24/2013 7.32* 0.50 - 1.35 mg/dL Final  04/23/2013 7.01* 0.50 - 1.35 mg/dL Final  04/22/2013 6.49* 0.50 - 1.35 mg/dL Final     Liver Function Tests:  Recent Labs  08/28/13 1253  AST 18  ALT 16  ALKPHOS 35*  BILITOT 0.3  PROT 7.3  ALBUMIN 3.1*    CBC:  Recent Labs  08/28/13 1253  WBC 7.0  HGB 6.8*  HCT 20.4*  MCV 75.6*  PLT 205    Recent Labs  08/28/13 1253  NEUTROABS 5.5  LYMPHSABS 0.8  MONOABS  0.6  EOSABS 0.1  BASOSABS 0.0      BNP:  Recent Labs  08/28/13 1253  PROBNP 40974.0*    Urinalysis    Component Value Date/Time   COLORURINE YELLOW 08/28/2013 1426   APPEARANCEUR HAZY* 08/28/2013 1426   LABSPEC 1.017 08/28/2013 1426   PHURINE 6.0 08/28/2013 1426   GLUCOSEU 100* 08/28/2013 1426   HGBUR SMALL* 08/28/2013 1426   BILIRUBINUR NEGATIVE 08/28/2013 1426   KETONESUR NEGATIVE 08/28/2013 1426   PROTEINUR >300* 08/28/2013 1426   UROBILINOGEN 0.2 08/28/2013 1426   NITRITE NEGATIVE 08/28/2013 1426   LEUKOCYTESUR SMALL* 08/28/2013 1426    Urine microscopic:  Recent Labs  08/28/13 1426  EPIU RARE  WBCU 11-20  RBCU 3-6  BACTERIA FEW*  LABCAST HYALINE CASTS*     Imaging results:  Dg Chest 2 View  08/28/2013   CLINICAL DATA:  Chest pain and shortness of breath.  EXAM: CHEST  2 VIEW  COMPARISON:  PA and lateral chest 04/19/2013.  FINDINGS: There is cardiomegaly with hazy bilateral perihilar opacities. No pneumothorax or pleural fluid.  IMPRESSION: Cardiomegaly with bilateral perihilar opacities which could be due to pulmonary edema or pneumonia.   Electronically Signed   By: Inge Rise M.D.   On: 08/28/2013 13:49  Other results: EKG: Normal sinus rhythm; possible Left atrial enlargement; left ventricular hypertrophy with repolarization abnormality  Assessment & Plan by Problem:  1.  Hypertensive emergency.  Patient presented with severely elevated blood pressure and progression of his renal failure, CXR consistent with pulmonary edema, and complaints of headache, chest pain, and dyspnea.  When we saw him, his blood pressure had not responded to an IV NTG drip.  We consulted CCM and discussed at bedside with Dr. Lamonte Sakai, who transferred patient to the critical care service for management.  2.  Renal failure.  Management including HD as per nephrology.

## 2013-08-28 NOTE — Progress Notes (Addendum)
Patient arrived to unit on stretcher via Miquel Dunn, ED RN NTG at 31ml/hr. Rechecked pt blood pressure prior to moving to bed BP 213/139 at 1705 on RUE, NTG gtt increased to 82ml/hr (16.37mcg).   IM teaching team paged at 1706. Need to see patient ASAP. We can only titrate NTG 3 times. Have titrated once.  Response- will see patient soon.  1708 221/141 1711 NTG increased to 44ml/hr (33.77mcg) . BP 224/140, 4L oxygen per nasal cannula placed. [id number] 230/151 1718 218/146 1719 233/146  1722 Dr. Barnetta Chapel in room speaking with patient.   1723 224/144 (165)  1728 NTG increased to 12ml/hr (70mcg)  1730  197/178  1740 220/142

## 2013-08-28 NOTE — Progress Notes (Signed)
Physician notified: Yacoub At: 1953  Regarding: BP now 191/118, want to hold transfer?  Awaiting return response.   Returned Response at: 1954  Order(s): Give 100mg  PO hydralazine, recheck BP in hour. Cancel transfer if sys stays 190-180s

## 2013-08-28 NOTE — H&P (Signed)
Date: 08/28/2013               Patient Name:  Patrick Anthony MRN: [id number]  DOB: 03/24/1971 Age / Sex: 43 y.o., male   PCP: No Pcp Per Patient         Medical Service: Internal Medicine Teaching Service         Attending Physician: Dr. Collene Gobble, MD    First Contact: Dr. Naaman Plummer Pager: [phone]  Second Contact: Dr. Eula Fried Pager: [phone]       After Hours (After 5p/  First Contact Pager: [phone]  weekends / holidays): Second Contact Pager: [phone]   Chief Complaint: Chest pain, SOB, and HA  History of Present Illness: Patrick Anthony is a 43 year old African American male with PMH of HTN (uncontrolled), Hep C, CKD stage 5 (was to get HD catheter placed today), and combined systolic and diastolic CHF (EF [id number], grade 2 DD) presenting to ED today with complaints of worsening SOB, headache, and chest pain x3 days.  He reports that his symptoms mainly started 3 weeks ago and is non-compliant with his BP medications (has not taken for at least 2 weeks).  He recently moved here from [place] and was set to start HD soon.  Patrick Anthony reports that overnight his symptoms got worse in regards to his shortness of breath, especially worse at night when lying flat and chest substernal/left sided chest pressure.  His pain is also associated with headache, occasional nausea but no blurry vision, fever, chills, abdominal pain, or diarrhea.  He does still make urine.  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  His chest pain is intermittent, non-radiating, and feels more like pressure, non-tender to palpation.    BP: 220's/150's in ED on admission    Meds: Current Facility-Administered Medications  Medication Dose Route Frequency Provider Last Rate Last Dose  . [START ON 08/29/2013] amLODipine (NORVASC) tablet 10 mg  10 mg Oral Daily Collene Gobble, MD      . Derrill Memo ON 08/29/2013] calcitRIOL (ROCALTROL) capsule 0.25 mcg  0.25 mcg Oral Daily  Collene Gobble, MD      . Derrill Memo ON 08/29/2013] carvedilol (COREG) tablet 25 mg  25 mg Oral BID WC Collene Gobble, MD      . cloNIDine (CATAPRES) tablet 0.2 mg  0.2 mg Oral BID Collene Gobble, MD   0.2 mg at 08/28/13 1853  . furosemide (LASIX) 200 mg in dextrose 5 % 50 mL IVPB  200 mg Intravenous Q6H Estanislado Emms, MD   200 mg at 08/28/13 1806  . hydrALAZINE (APRESOLINE) tablet 100 mg  100 mg Oral Q6H Rush Farmer, MD      . Derrill Memo ON 08/29/2013] multivitamin (RENA-VIT) tablet 1 tablet  1 tablet Oral QHS Collene Gobble, MD      . niCARdipine (CARDENE-IV) infusion (0.1 mg/ml)  3-15 mg/hr Intravenous Continuous Collene Gobble, MD      . nitroGLYCERIN 0.2 mg/mL in dextrose 5 % infusion  5 mcg/min Intravenous Titrated Collene Gobble, MD 15 mL/hr at 08/28/13 1900 50 mcg/min at 08/28/13 1900   Allergies: Allergies as of 08/28/2013  . (No Known Allergies)   Past Medical History  Diagnosis Date  . Hypertension   . CHF (congestive heart failure)   . Renal disorder renal failure  . Cirrhosis of liver   . Drug addiction   . ETOH abuse   .  Sleep apnea   . Hepatitis    Past Surgical History  Procedure Laterality Date  . Av fistula placement Right    History reviewed. No pertinent family history. History   Social History  . Marital Status: Single    Spouse Name: N/A    Number of Children: N/A  . Years of Education: N/A   Occupational History  . Not on file.   Social History Main Topics  . Smoking status: Never Smoker   . Smokeless tobacco: Not on file  . Alcohol Use: Yes  . Drug Use: No     Comment: Quit  . Sexual Activity: Not on file   Other Topics Concern  . Not on file   Social History Narrative  . No narrative on file   Review of Systems:  Constitutional:  Fatigue. Denies fever, chills, diaphoresis, appetite change.   HEENT:  Denies congestion, sore throat  Respiratory:  SOB, DOE, occasional cough, orthopnea.   Cardiovascular:  Palpitations and leg swelling.     Gastrointestinal:  Nausea, vomiting.  Denies abdominal pain, diarrhea, constipation, blood in stool and abdominal distention.   Genitourinary:  Denies dysuria.  Musculoskeletal:  Denies myalgias  Skin:  Denies pallor, rash and wound.   Neurological:  Headaches, weakness.  Denies dizziness, syncope, numbness.    Physical Exam: Blood pressure 191/121, pulse 77, temperature 98.2 F (36.8 C), temperature source Oral, resp. rate 16, height 6' (1.829 m), weight 173 lb 1 oz (78.5 kg), SpO2 99.00%. Vitals reviewed. General: resting in strecher, NAD HEENT: PERRL, EOMI Cardiac: RRR Pulm: mild b/l crackles Abd: soft, nontender, BS present Ext: warm and well perfused, +1 pitting edema b/l lower extremities Neuro: alert and oriented X3, cranial nerves II-XII grossly intact, strength equal in bilateral upper and lower extremities  Lab results: Basic Metabolic Panel:  Recent Labs  08/28/13 1253  NA 140  K 3.4*  CL 101  CO2 15*  GLUCOSE 91  BUN 114*  CREATININE 15.60*  CALCIUM 8.2*   Liver Function Tests:  Recent Labs  08/28/13 1253  AST 18  ALT 16  ALKPHOS 35*  BILITOT 0.3  PROT 7.3  ALBUMIN 3.1*   CBC:  Recent Labs  08/28/13 1253  WBC 7.0  NEUTROABS 5.5  HGB 6.8*  HCT 20.4*  MCV 75.6*  PLT 205   BNP:  Recent Labs  08/28/13 1253  PROBNP 40974.0*   Urine Drug Screen: Drugs of Abuse     Component Value Date/Time   LABOPIA POSITIVE* 04/20/2013 1108   COCAINSCRNUR NONE DETECTED 04/20/2013 1108   LABBENZ NONE DETECTED 04/20/2013 1108   AMPHETMU NONE DETECTED 04/20/2013 1108   THCU NONE DETECTED 04/20/2013 1108   LABBARB NONE DETECTED 04/20/2013 1108    Urinalysis:  Recent Labs  08/28/13 1426  COLORURINE YELLOW  LABSPEC 1.017  PHURINE 6.0  GLUCOSEU 100*  HGBUR SMALL*  BILIRUBINUR NEGATIVE  KETONESUR NEGATIVE  PROTEINUR >300*  UROBILINOGEN 0.2  NITRITE NEGATIVE  LEUKOCYTESUR SMALL*   Imaging results:  Dg Chest 2 View  08/28/2013   CLINICAL DATA:   Chest pain and shortness of breath.  EXAM: CHEST  2 VIEW  COMPARISON:  PA and lateral chest 04/19/2013.  FINDINGS: There is cardiomegaly with hazy bilateral perihilar opacities. No pneumothorax or pleural fluid.  IMPRESSION: Cardiomegaly with bilateral perihilar opacities which could be due to pulmonary edema or pneumonia.   Electronically Signed   By: Inge Rise M.D.   On: 08/28/2013 13:49   Other results: EKG: 97bpm, LVH,  t wave inversion v5 and v6  Assessment & Plan by Problem: Principal Problem:   Hypertensive emergency Active Problems:   Systolic and diastolic CHF, acute on chronic   CKD (chronic kidney disease), stage V   Headache(784.0)   Chest pain Mr. Gura is a 43 year old male with uncontrolled HTN, CKD stage 5, and combined CHF admitted for hypertensive emergency.    Hypertensive emergency--secondary to medication non-compliance in setting of CKD stage 5 and combined systolic and diastolic CHF.  Presented with complaints of headache, chest pressure, and worsening sob x3 days.  BP on admission 200's/100's. Cr up to 15.6.  CXR on admission with cardiomegaly with b/l perihilar opacities which could be due to pulmonary edema or PNA.  Mild crackles on physical exam with mild pitting edema of lower extremities.  Started on nitro gtt in ED and titrated up in step down unit with minimal improvement of his BP and chest pressure.  Home medications (has been out for 2 weeks) include norvasc 10mg , coreg 25mg  bid, clonidine 0.1mg  bid, lasix 80mg  bid, hydralazine 100mg  q8h.   -admitted to SDU but discussed with PCCM who will take over as primary and admit to ICU for further care and stabilization -continue to titrate up nitro gtt, but may need to transition to cardene gtt, goal SBP 180 and/or reduce MAP by ~10-20% in first hour then gradually decrease over next 24 hours with reduction of ~25% compared to baseline.  -given clonidine 0.1mg  in ED, renal ordered clonidine 0.2mg  bid, coreg 25mg   bid, norvasc 10mg  qd, and lasix gtt.  -appreciate renal following: BP control that should improve with HD (likely tomorrow), continue to diurese, AVF placement then HD for 3 days after that and will need outpatient HD -cycle CE -AM EKG -tsh, HbA1C, lipid panel -u/a, uds, hiv, hepatitis panel -AM labs: renal function panel and CBC -o2 , keep o2 sats >92%  Acute on chronic renal failure in setting of CKD 5 and uremia but non-oliguric.  Presented to hospital today for AVF placement but admitted for hypertensive emergency.   -renal following, AVF placement likely by IR. Diurese and BP control for now. Will need to start HD after AV placement. -trend renal function   Anion gap metabolic acidosis in setting of uremia with CKD stage 5--will start HD likely tomorrow pending AV placement.   Hepatitis C per patient.  Need to obtain records.  ALP low at 35, AST and ALT wnl.  -hepatitis panel -check HIV  Anemia--likely setting to chronic disease with ESRD.  Hb 6.8 on admission, was 11.7 in October 2014.   -Transfusion ordered by ED but canceled by PCCM given volume status. Per primary -trend CBC  Acute on chronic diastolic and systolic heart failure--EF 40-45%, diffuse hypokinesis, mild LVH, moderate concentric hypertrophy, mildly to moderately reduced systolic function, pseudonormal LV filling pattern with concomitant abnormal relaxation and increased filling pressure, grade 2 DD. Elevated LA filling pressure. CXR suggestive of pulmonary edema, crackles on physical exam, and mild pitting edema of lower extremities with reports of worsening dyspnea and orthopnea.  -diurese--lasix gtt per renal -caution with additional volume  Diet: NPO except for meds for now then advance to renal diet as tolerated DVT Ppx: SCDs for now Dispo: Disposition is deferred at this time, awaiting improvement of current medical problems. Anticipated discharge in approximately 2-3 day(s).   The patient does not have a  current PCP (No Pcp Per Patient) and does need an [facility] hospital follow-up appointment after discharge.  The patient does have transportation limitations that hinder transportation to clinic appointments.  Signed: Jerene Pitch, MD 08/28/2013, 8:06 PM

## 2013-08-29 ENCOUNTER — Inpatient Hospital Stay (HOSPITAL_COMMUNITY): Payer: Medicaid Other

## 2013-08-29 ENCOUNTER — Encounter (HOSPITAL_COMMUNITY): Payer: Self-pay | Admitting: Certified Registered"

## 2013-08-29 ENCOUNTER — Encounter (HOSPITAL_COMMUNITY): Payer: Medicaid Other | Admitting: Certified Registered"

## 2013-08-29 ENCOUNTER — Encounter (HOSPITAL_COMMUNITY)
Admission: EM | Disposition: A | Discharge status: Home / Self Care | Payer: Self-pay | Source: Home / Self Care | Attending: Internal Medicine

## 2013-08-29 ENCOUNTER — Inpatient Hospital Stay (HOSPITAL_COMMUNITY): Payer: Medicaid Other | Admitting: Certified Registered"

## 2013-08-29 DIAGNOSIS — N186 End stage renal disease: Secondary | ICD-10-CM

## 2013-08-29 HISTORY — PX: INSERTION OF DIALYSIS CATHETER: SHX1324

## 2013-08-29 LAB — HEPATITIS PANEL, ACUTE
HCV Ab: REACTIVE — AB
Hep A IgM: NONREACTIVE
Hep B C IgM: NONREACTIVE
Hepatitis B Surface Ag: NEGATIVE

## 2013-08-29 LAB — HEMOGLOBIN A1C
Hgb A1c MFr Bld: 4.6 % (ref <5.7)
Mean Plasma Glucose: 85 mg/dL (ref <117)

## 2013-08-29 LAB — BASIC METABOLIC PANEL
BUN: 78 mg/dL — ABNORMAL HIGH (ref 6–23)
CO2: 22 mEq/L (ref 19–32)
Calcium: 8.2 mg/dL — ABNORMAL LOW (ref 8.4–10.5)
Chloride: 98 mEq/L (ref 96–112)
Creatinine, Ser: 11.52 mg/dL — ABNORMAL HIGH (ref 0.50–1.35)
GFR calc Af Amer: 6 mL/min — ABNORMAL LOW (ref >90)
GFR calc non Af Amer: 5 mL/min — ABNORMAL LOW (ref >90)
Glucose, Bld: 150 mg/dL — ABNORMAL HIGH (ref 70–99)
Potassium: 3.1 mEq/L — ABNORMAL LOW (ref 3.7–5.3)
Sodium: 138 mEq/L (ref 137–147)

## 2013-08-29 LAB — RENAL FUNCTION PANEL
Albumin: 2.8 g/dL — ABNORMAL LOW (ref 3.5–5.2)
BUN: 118 mg/dL — ABNORMAL HIGH (ref 6–23)
CO2: 14 mEq/L — ABNORMAL LOW (ref 19–32)
Calcium: 8.1 mg/dL — ABNORMAL LOW (ref 8.4–10.5)
Chloride: 102 mEq/L (ref 96–112)
Creatinine, Ser: 15.86 mg/dL — ABNORMAL HIGH (ref 0.50–1.35)
GFR calc Af Amer: 4 mL/min — ABNORMAL LOW (ref >90)
GFR calc non Af Amer: 3 mL/min — ABNORMAL LOW (ref >90)
Glucose, Bld: 97 mg/dL (ref 70–99)
Phosphorus: 6.7 mg/dL — ABNORMAL HIGH (ref 2.3–4.6)
Potassium: 3.3 mEq/L — ABNORMAL LOW (ref 3.7–5.3)
Sodium: 142 mEq/L (ref 137–147)

## 2013-08-29 LAB — CBC
HCT: 18.4 % — ABNORMAL LOW (ref 39.0–52.0)
Hemoglobin: 6.2 g/dL — CL (ref 13.0–17.0)
MCH: 25.6 pg — ABNORMAL LOW (ref 26.0–34.0)
MCHC: 33.7 g/dL (ref 30.0–36.0)
MCV: 76 fL — ABNORMAL LOW (ref 78.0–100.0)
Platelets: 182 10*3/uL (ref 150–400)
RBC: 2.42 MIL/uL — ABNORMAL LOW (ref 4.22–5.81)
RDW: 18 % — ABNORMAL HIGH (ref 11.5–15.5)
WBC: 6.4 10*3/uL (ref 4.0–10.5)

## 2013-08-29 LAB — HEPATITIS B CORE ANTIBODY, TOTAL: Hep B Core Total Ab: NONREACTIVE

## 2013-08-29 LAB — HEPATITIS B SURFACE ANTIBODY,QUALITATIVE: Hep B S Ab: NEGATIVE

## 2013-08-29 LAB — HEMOGLOBIN AND HEMATOCRIT, BLOOD
HCT: 24.3 % — ABNORMAL LOW (ref 39.0–52.0)
Hemoglobin: 8.2 g/dL — ABNORMAL LOW (ref 13.0–17.0)

## 2013-08-29 LAB — MAGNESIUM: Magnesium: 2 mg/dL (ref 1.5–2.5)

## 2013-08-29 LAB — PROTIME-INR
INR: 1.21 (ref 0.00–1.49)
Prothrombin Time: 15 seconds (ref 11.6–15.2)

## 2013-08-29 LAB — PREPARE RBC (CROSSMATCH)

## 2013-08-29 LAB — TROPONIN I
Troponin I: <0.3 ng/mL (ref <0.30)
Troponin I: <0.3 ng/mL (ref <0.30)

## 2013-08-29 LAB — URINE CULTURE
Colony Count: NO GROWTH
Culture: NO GROWTH

## 2013-08-29 LAB — RAPID URINE DRUG SCREEN, HOSP PERFORMED
Amphetamines: NOT DETECTED
Barbiturates: NOT DETECTED
Benzodiazepines: NOT DETECTED
Cocaine: NOT DETECTED
Opiates: NOT DETECTED
Tetrahydrocannabinol: NOT DETECTED

## 2013-08-29 LAB — APTT: aPTT: 38 seconds — ABNORMAL HIGH (ref 24–37)

## 2013-08-29 LAB — HIV ANTIBODY (ROUTINE TESTING W REFLEX): HIV: NONREACTIVE

## 2013-08-29 LAB — TSH: TSH: 2.006 u[IU]/mL (ref 0.350–4.500)

## 2013-08-29 IMAGING — DX DG CHEST 1V PORT
1 series · 1 of 1 positions shown · non-contrast
Comparison: [DATE]

CLINICAL DATA: Dialysis catheter placement.

EXAM:
PORTABLE CHEST - 1 VIEW

[portable]
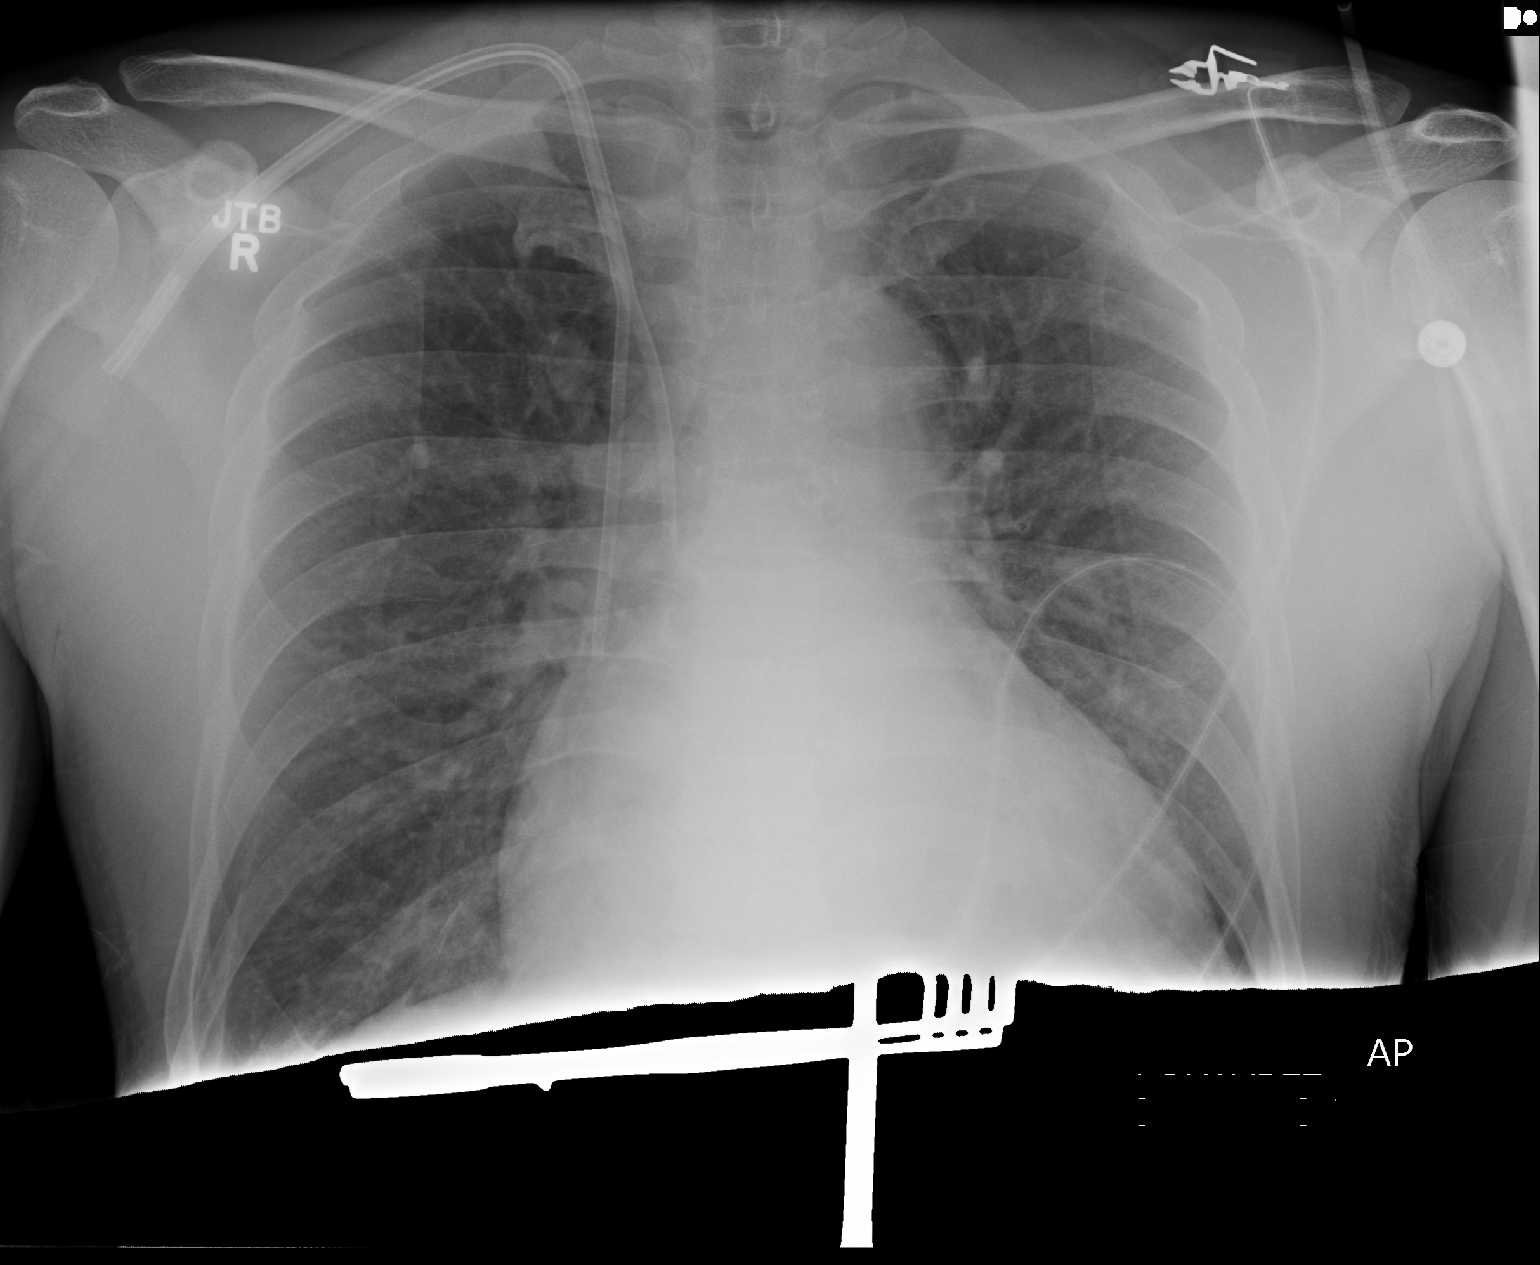

[1 of 1 positions shown; findings below may reference images not displayed]

FINDINGS: Technical malfunction resulted in incomplete imaging of the lung
bases.

There has been interval placement of a right jugular dual-lumen
central venous catheter with tips overlying the lower SVC. Moderate
cardiomegaly remains. The lungs are well inflated. Patchy perihilar
opacities have slightly improved from the prior study. There is no
evidence of new airspace consolidation, large pleural effusion, or
pneumothorax.
IMPRESSION: 1. Interval right jugular CVC placement as above.
2. Cardiomegaly.
3. Slight interval improvement of perihilar opacities, suggestive of
improved, mild edema.

## 2013-08-29 SURGERY — INSERTION OF DIALYSIS CATHETER
Anesthesia: General | Site: Neck

## 2013-08-29 MED ORDER — ONDANSETRON HCL 4 MG/2ML IJ SOLN: 4.0000 mg | Freq: Once | INTRAMUSCULAR | Status: DC | PRN

## 2013-08-29 MED ORDER — HYDROMORPHONE HCL PF 1 MG/ML IJ SOLN: 0.2500 mg | INTRAMUSCULAR | Status: DC | PRN

## 2013-08-29 MED ORDER — CALCITRIOL 0.25 MCG PO CAPS
0.2500 ug | ORAL_CAPSULE | Freq: Every day | ORAL | Status: DC
  Filled 2013-08-29 (×2): qty 1

## 2013-08-29 MED ORDER — CARVEDILOL 25 MG PO TABS
50.0000 mg | ORAL_TABLET | Freq: Two times a day (BID) | ORAL | Status: DC
  Administered 2013-08-30 – 2013-09-04 (×10): 50 mg via ORAL
  Filled 2013-08-29 (×14): qty 2

## 2013-08-29 MED ORDER — FENTANYL CITRATE 0.05 MG/ML IJ SOLN
INTRAMUSCULAR | Status: DC | PRN
  Administered 2013-08-29: 50 ug via INTRAVENOUS

## 2013-08-29 MED ORDER — VITAMIN D (ERGOCALCIFEROL) 1.25 MG (50000 UNIT) PO CAPS: 50000.0000 [IU] | ORAL_CAPSULE | ORAL | Status: DC

## 2013-08-29 MED ORDER — DARBEPOETIN ALFA-POLYSORBATE 200 MCG/0.4ML IJ SOLN
200.0000 ug | INTRAMUSCULAR | Status: DC
  Filled 2013-08-29: qty 0.4

## 2013-08-29 MED ORDER — HYDRALAZINE HCL 100 MG PO TABS: 100.0000 mg | ORAL_TABLET | Freq: Two times a day (BID) | ORAL | Status: DC

## 2013-08-29 MED ORDER — AMLODIPINE BESYLATE 10 MG PO TABS
10.0000 mg | ORAL_TABLET | Freq: Every day | ORAL | Status: DC
  Filled 2013-08-29: qty 1

## 2013-08-29 MED ORDER — MIDAZOLAM HCL 2 MG/2ML IJ SOLN
INTRAMUSCULAR | Status: AC
  Filled 2013-08-29: qty 2

## 2013-08-29 MED ORDER — DEXTROSE 5 % IV SOLN
INTRAVENOUS | Status: AC
  Filled 2013-08-29: qty 1.5

## 2013-08-29 MED ORDER — CLONIDINE HCL 0.1 MG PO TABS
0.1000 mg | ORAL_TABLET | Freq: Two times a day (BID) | ORAL | Status: DC
  Filled 2013-08-29: qty 1

## 2013-08-29 MED ORDER — OXYCODONE HCL 5 MG PO TABS: 5.0000 mg | ORAL_TABLET | Freq: Once | ORAL | Status: DC | PRN

## 2013-08-29 MED ORDER — CARVEDILOL 25 MG PO TABS: 25.0000 mg | ORAL_TABLET | Freq: Every day | ORAL | Status: DC

## 2013-08-29 MED ORDER — OXYCODONE HCL 5 MG/5ML PO SOLN: 5.0000 mg | Freq: Once | ORAL | Status: DC | PRN

## 2013-08-29 MED ORDER — LIDOCAINE HCL (PF) 1 % IJ SOLN
INTRAMUSCULAR | Status: AC
  Filled 2013-08-29: qty 30

## 2013-08-29 MED ORDER — NITROGLYCERIN 0.4 MG SL SUBL: 0.4000 mg | SUBLINGUAL_TABLET | SUBLINGUAL | Status: DC | PRN

## 2013-08-29 MED ORDER — FUROSEMIDE 80 MG PO TABS: 40.0000 mg | ORAL_TABLET | Freq: Every day | ORAL | Status: DC

## 2013-08-29 MED ORDER — DEXTROSE 5 % IV SOLN
1.5000 g | Freq: Once | INTRAVENOUS | Status: AC
  Administered 2013-08-29: 1.5 g via INTRAVENOUS

## 2013-08-29 MED ORDER — LIDOCAINE-EPINEPHRINE 0.5 %-1:200000 IJ SOLN: INTRAMUSCULAR | Status: DC | PRN

## 2013-08-29 MED ORDER — HEPARIN SODIUM (PORCINE) 1000 UNIT/ML IJ SOLN
INTRAMUSCULAR | Status: DC | PRN
  Administered 2013-08-29: 10 mL via INTRAVENOUS

## 2013-08-29 MED ORDER — 0.9 % SODIUM CHLORIDE (POUR BTL) OPTIME
TOPICAL | Status: DC | PRN
  Administered 2013-08-29: 1000 mL

## 2013-08-29 MED ORDER — PROPOFOL 10 MG/ML IV EMUL
INTRAVENOUS | Status: AC
  Filled 2013-08-29: qty 50

## 2013-08-29 MED ORDER — OXYCODONE HCL 5 MG PO TABS
5.0000 mg | ORAL_TABLET | Freq: Four times a day (QID) | ORAL | Status: AC | PRN
  Administered 2013-08-29 – 2013-08-30 (×3): 5 mg via ORAL
  Filled 2013-08-29 (×3): qty 1

## 2013-08-29 MED ORDER — HEPARIN SODIUM (PORCINE) 1000 UNIT/ML IJ SOLN
INTRAMUSCULAR | Status: AC
  Filled 2013-08-29: qty 1

## 2013-08-29 MED ORDER — LIDOCAINE-EPINEPHRINE 0.5 %-1:200000 IJ SOLN
INTRAMUSCULAR | Status: AC
  Filled 2013-08-29: qty 1

## 2013-08-29 MED ORDER — PROPOFOL 10 MG/ML IV BOLUS
INTRAVENOUS | Status: AC
  Filled 2013-08-29: qty 20

## 2013-08-29 MED ORDER — HYDRALAZINE HCL 50 MG PO TABS
100.0000 mg | ORAL_TABLET | Freq: Three times a day (TID) | ORAL | Status: DC
  Administered 2013-08-30 – 2013-09-04 (×15): 100 mg via ORAL
  Filled 2013-08-29 (×21): qty 2

## 2013-08-29 MED ORDER — ADULT MULTIVITAMIN W/MINERALS CH
1.0000 | ORAL_TABLET | Freq: Every day | ORAL | Status: DC
  Administered 2013-08-30 – 2013-09-04 (×6): 1 via ORAL
  Filled 2013-08-29 (×6): qty 1

## 2013-08-29 MED ORDER — MEPERIDINE HCL 25 MG/ML IJ SOLN
6.2500 mg | INTRAMUSCULAR | Status: DC | PRN
Start: 2013-08-29 — End: 2013-08-29

## 2013-08-29 MED ORDER — TRAMADOL HCL 50 MG PO TABS
ORAL_TABLET | ORAL | Status: AC
  Administered 2013-08-29: 100 mg via ORAL
  Filled 2013-08-29: qty 2

## 2013-08-29 MED ORDER — LIDOCAINE HCL 1 % IJ SOLN
INTRAMUSCULAR | Status: DC | PRN
  Administered 2013-08-29: 30 mL

## 2013-08-29 MED ORDER — AMLODIPINE BESYLATE 10 MG PO TABS
10.0000 mg | ORAL_TABLET | Freq: Every day | ORAL | Status: DC
  Administered 2013-08-30 – 2013-09-03 (×5): 10 mg via ORAL
  Filled 2013-08-29 (×6): qty 1

## 2013-08-29 MED ORDER — FENTANYL CITRATE 0.05 MG/ML IJ SOLN
INTRAMUSCULAR | Status: AC
  Filled 2013-08-29: qty 5

## 2013-08-29 MED ORDER — FUROSEMIDE 40 MG PO TABS: 40.0000 mg | ORAL_TABLET | Freq: Every day | ORAL | Status: DC

## 2013-08-29 MED ORDER — TRAMADOL HCL 50 MG PO TABS
100.0000 mg | ORAL_TABLET | Freq: Once | ORAL | Status: AC
  Administered 2013-08-29: 100 mg via ORAL

## 2013-08-29 MED ORDER — MIDAZOLAM HCL 5 MG/5ML IJ SOLN
INTRAMUSCULAR | Status: DC | PRN
  Administered 2013-08-29: 2 mg via INTRAVENOUS

## 2013-08-29 MED ORDER — SODIUM CHLORIDE 0.9 % IV SOLN
INTRAVENOUS | Status: DC
  Administered 2013-08-29: 35 mL/h via INTRAVENOUS

## 2013-08-29 MED ORDER — PROPOFOL INFUSION 10 MG/ML OPTIME
INTRAVENOUS | Status: DC | PRN
  Administered 2013-08-29: 120 ug/kg/min via INTRAVENOUS

## 2013-08-29 MED ORDER — SODIUM CHLORIDE 0.9 % IR SOLN
Status: DC | PRN
  Administered 2013-08-29: 13:00:00

## 2013-08-29 MED ORDER — CARVEDILOL 25 MG PO TABS
25.0000 mg | ORAL_TABLET | Freq: Two times a day (BID) | ORAL | Status: DC
  Filled 2013-08-29 (×2): qty 1

## 2013-08-29 SURGICAL SUPPLY — 38 items
BAG DECANTER FOR FLEXI CONT (MISCELLANEOUS) ×2 IMPLANT
CATH CANNON HEMO 15F 50CM (CATHETERS) IMPLANT
CATH CANNON HEMO 15FR 19 (HEMODIALYSIS SUPPLIES) ×2 IMPLANT
CATH CANNON HEMO 15FR 23CM (HEMODIALYSIS SUPPLIES) IMPLANT
CATH CANNON HEMO 15FR 31CM (HEMODIALYSIS SUPPLIES) IMPLANT
CATH CANNON HEMO 15FR 32CM (HEMODIALYSIS SUPPLIES) IMPLANT
COVER PROBE W GEL 5X96 (DRAPES) IMPLANT
COVER SURGICAL LIGHT HANDLE (MISCELLANEOUS) ×2 IMPLANT
DECANTER SPIKE VIAL GLASS SM (MISCELLANEOUS) ×2 IMPLANT
DRAPE C-ARM 42X72 X-RAY (DRAPES) ×2 IMPLANT
DRAPE CHEST BREAST 15X10 FENES (DRAPES) ×2 IMPLANT
DRSG OPSITE POSTOP 3X4 (GAUZE/BANDAGES/DRESSINGS) ×2 IMPLANT
GAUZE SPONGE 2X2 8PLY STRL LF (GAUZE/BANDAGES/DRESSINGS) ×1 IMPLANT
GAUZE SPONGE 4X4 16PLY XRAY LF (GAUZE/BANDAGES/DRESSINGS) ×2 IMPLANT
GLOVE SS BIOGEL STRL SZ 7.5 (GLOVE) ×1 IMPLANT
GLOVE SUPERSENSE BIOGEL SZ 7.5 (GLOVE) ×1
GOWN STRL REUS W/ TWL LRG LVL3 (GOWN DISPOSABLE) ×3 IMPLANT
GOWN STRL REUS W/TWL LRG LVL3 (GOWN DISPOSABLE) ×3
KIT BASIN OR (CUSTOM PROCEDURE TRAY) ×2 IMPLANT
KIT ROOM TURNOVER OR (KITS) ×2 IMPLANT
NEEDLE 18GX1X1/2 (RX/OR ONLY) (NEEDLE) ×2 IMPLANT
NEEDLE 22X1 1/2 (OR ONLY) (NEEDLE) ×2 IMPLANT
NEEDLE HYPO 25GX1X1/2 BEV (NEEDLE) ×2 IMPLANT
NS IRRIG 1000ML POUR BTL (IV SOLUTION) ×2 IMPLANT
PACK SURGICAL SETUP 50X90 (CUSTOM PROCEDURE TRAY) ×2 IMPLANT
PAD ARMBOARD 7.5X6 YLW CONV (MISCELLANEOUS) ×4 IMPLANT
SOAP 2 % CHG 4 OZ (WOUND CARE) ×2 IMPLANT
SPONGE GAUZE 2X2 STER 10/PKG (GAUZE/BANDAGES/DRESSINGS) ×1
SUT ETHILON 3 0 PS 1 (SUTURE) ×2 IMPLANT
SUT VICRYL 4-0 PS2 18IN ABS (SUTURE) ×2 IMPLANT
SYR 20CC LL (SYRINGE) ×2 IMPLANT
SYR 30ML LL (SYRINGE) IMPLANT
SYR 5ML LL (SYRINGE) ×4 IMPLANT
SYR CONTROL 10ML LL (SYRINGE) ×2 IMPLANT
SYRINGE 10CC LL (SYRINGE) ×2 IMPLANT
TOWEL OR 17X24 6PK STRL BLUE (TOWEL DISPOSABLE) ×2 IMPLANT
TOWEL OR 17X26 10 PK STRL BLUE (TOWEL DISPOSABLE) ×2 IMPLANT
WATER STERILE IRR 1000ML POUR (IV SOLUTION) ×2 IMPLANT

## 2013-08-29 NOTE — H&P (Signed)
See attending admission note.

## 2013-08-29 NOTE — Anesthesia Preprocedure Evaluation (Signed)
Anesthesia Evaluation  Patient identified by MRN, date of birth, ID band Patient awake    Reviewed: Allergy & Precautions, H&P , NPO status , Patient's Chart, lab work & pertinent test results  Airway Mallampati: I TM Distance: >3 FB Neck ROM: Full    Dental   Pulmonary sleep apnea ,          Cardiovascular hypertension, Pt. on medications +CHF     Neuro/Psych    GI/Hepatic (+) Hepatitis -  Endo/Other    Renal/GU ESRFRenal disease     Musculoskeletal   Abdominal   Peds  Hematology   Anesthesia Other Findings   Reproductive/Obstetrics                           Anesthesia Physical Anesthesia Plan  ASA: III  Anesthesia Plan: General   Post-op Pain Management:    Induction: Intravenous  Airway Management Planned: LMA  Additional Equipment:   Intra-op Plan:   Post-operative Plan: Extubation in OR  Informed Consent: I have reviewed the patients History and Physical, chart, labs and discussed the procedure including the risks, benefits and alternatives for the proposed anesthesia with the patient or authorized representative who has indicated his/her understanding and acceptance.     Plan Discussed with: CRNA and Surgeon  Anesthesia Plan Comments:         Anesthesia Quick Evaluation

## 2013-08-29 NOTE — Progress Notes (Signed)
Patient goiong to OR for placement of dialysis cath.

## 2013-08-29 NOTE — Anesthesia Procedure Notes (Signed)
Procedure Name: MAC Date/Time: 08/29/2013 1:50 PM Performed by: Julian Reil Pre-anesthesia Checklist: Patient identified, Emergency Drugs available, Suction available and Patient being monitored Patient Re-evaluated:Patient Re-evaluated prior to inductionOxygen Delivery Method: Nasal cannula Intubation Type: IV induction Placement Confirmation: positive ETCO2 and breath sounds checked- equal and bilateral

## 2013-08-29 NOTE — Progress Notes (Addendum)
   Asked to place [facility] this AM.  Prior IR order canceled by Nephrology.  Will arrange for [facility] today.  Will try to arrange L arm access placement Mon/Tuesday if patient willing.  Adele Barthel, MD [facility] Office: [phone] Pager: [phone]  08/29/2013, 8:46 AM

## 2013-08-29 NOTE — Progress Notes (Signed)
[place] Progress Note Patient Name: Patrick Anthony DOB: 12-Aug-1970 MRN: [id number]  Date of Service  08/29/2013   HPI/Events of Note   Hb 6.2  eICU Interventions  Transfuse 1 U PRBC at dialysis, rounding team to write order once time of HD clear   Intervention Category Intermediate Interventions: Diagnostic test evaluation  Shaylee Stanislawski V. 08/29/2013, 3:44 AM

## 2013-08-29 NOTE — Progress Notes (Signed)
  I have seen and examined the patient, and reviewed the daily progress note by Melodye Ped, [place] [address] and discussed the care of the patient with them. Please see my progress note from 08/29/2013 for further details regarding assessment and plan.    Signed:  Jerene Pitch, MD 08/29/2013, 5:33 PM

## 2013-08-29 NOTE — Preoperative (Signed)
Beta Blockers   Reason not to administer Beta Blockers:Not Applicable 

## 2013-08-29 NOTE — H&P (View-Only) (Signed)
   Asked to place [facility] this AM.  Prior IR order canceled by Nephrology.  Will arrange for [facility] today.  Will try to arrange L arm access placement Mon/Tuesday if patient willing.  Adele Barthel, MD [facility] Office: [phone] Pager: [phone]  08/29/2013, 8:46 AM

## 2013-08-29 NOTE — Progress Notes (Signed)
INITIAL NUTRITION ASSESSMENT  DOCUMENTATION CODES Per approved criteria  -Not Applicable   INTERVENTION:  Recommend adding Resource Breeze BID, once diet advanced.   Will provide Renal education as appropriate.   NUTRITION DIAGNOSIS: Increased nutrient needs related to hemodialysis as evidenced by estimated needs.   Goal: Pt to meet >/= 90% of their estimated nutrition needs   Monitor:  Diet advancement, PO intake, weight trend, labs   Reason for Assessment: Pt identified as at nutrition risk on the Malnutrition Screen Tool  43 y.o. male  Admitting Dx: Hypertensive emergency  ASSESSMENT: Pt presented to hospital for AVF placement but admitted in hypertensive emergency. Pt with uncontrolled HTN due to medical non-compliance, CKD stage 5, and CHF. Pt with hx of ETOH and heroin abuse.  Plan for HD catheter placement today to allow for HD to begin. Pt will have outpatient HD set up.  Potassium low, phosphorus level high.   Pt screened for weight loss and poor intake PTA. No weight loss per record review. Pt with uremia on admission likely affecting appetite.  Unable to assess pt's nutrition status at this time, pt is in OR for tunnel cath.   Height: Ht Readings from Last 1 Encounters:  08/28/13 6' (1.829 m)    Weight: Wt Readings from Last 1 Encounters:  08/28/13 173 lb 1 oz (78.5 kg)    Ideal Body Weight: 80.9 kg   % Ideal Body Weight: 97%  Wt Readings from Last 10 Encounters:  08/28/13 173 lb 1 oz (78.5 kg)  08/28/13 173 lb 1 oz (78.5 kg)  04/23/13 174 lb 11.2 oz (79.243 kg)    Usual Body Weight: 174 lb   % Usual Body Weight: 100%  BMI:  Body mass index is 23.47 kg/(m^2).  Estimated Nutritional Needs: Kcal: [id number] Protein: 90-100 grams Fluid: 1.2 L/day  Skin: no issues noted  Diet Order: NPO  EDUCATION NEEDS: -Education not appropriate at this time   Intake/Output Summary (Last 24 hours) at 08/29/13 0958 Last data filed at 08/29/13 0830  Gross per 24 hour  Intake    140 ml  Output   1750 ml  Net  -1610 ml    Last BM: 1/21   Labs:   Recent Labs Lab 08/28/13 1253 08/29/13 0245  NA 140 142  K 3.4* 3.3*  CL 101 102  CO2 15* 14*  BUN 114* 118*  CREATININE 15.60* 15.86*  CALCIUM 8.2* 8.1*  MG  --  2.0  PHOS  --  6.7*  GLUCOSE 91 97    CBG (last 3)  No results found for this basename: GLUCAP,  in the last 72 hours  Scheduled Meds: . amLODipine  10 mg Oral Daily  . calcitRIOL  0.25 mcg Oral Daily  . carvedilol  25 mg Oral BID WC  . cloNIDine  0.2 mg Oral BID  . furosemide  200 mg Intravenous Q6H  . hydrALAZINE  100 mg Oral Q6H  . multivitamin  1 tablet Oral QHS    Continuous Infusions: . niCARDipine Stopped (08/28/13 2018)  . nitroGLYCERIN 50 mcg/min (08/29/13 [id number])    Past Medical History  Diagnosis Date  . Hypertension   . CHF (congestive heart failure)   . Renal disorder renal failure  . Cirrhosis of liver   . Drug addiction   . ETOH abuse   . Sleep apnea   . Hepatitis     Past Surgical History  Procedure Laterality Date  . Av fistula placement Right     [place]  [place], [place], [place] Pager [phone] After Hours Pager

## 2013-08-29 NOTE — Progress Notes (Signed)
Patient experiencing a pretty bad headache, probably from the nitro drip. Will notify residents to order some tylenol.

## 2013-08-29 NOTE — Anesthesia Postprocedure Evaluation (Signed)
Anesthesia Post Note  Patient: Patrick Anthony  Procedure(s) Performed: Procedure(s) (LRB): INSERTION OF DIALYSIS CATHETER (N/A)  Anesthesia type: general  Patient location: PACU  Post pain: Pain level controlled  Post assessment: Patient's Cardiovascular Status Stable  Last Vitals:  Filed Vitals:   08/29/13 1538  BP: 160/102  Pulse: 72  Temp:   Resp: 14    Post vital signs: Reviewed and stable  Level of consciousness: sedated  Complications: No apparent anesthesia complications

## 2013-08-29 NOTE — Transfer of Care (Signed)
Immediate Anesthesia Transfer of Care Note  Patient: Patrick Anthony  Procedure(s) Performed: Procedure(s): INSERTION OF DIALYSIS CATHETER (N/A)  Patient Location: PACU  Anesthesia Type:MAC  Level of Consciousness: awake, alert , oriented and patient cooperative  Airway & Oxygen Therapy: Patient Spontanous Breathing and Patient connected to nasal cannula oxygen  Post-op Assessment: Report given to PACU RN, Post -op Vital signs reviewed and stable and Patient moving all extremities  Post vital signs: Reviewed and stable  Complications: No apparent anesthesia complications

## 2013-08-29 NOTE — Progress Notes (Addendum)
Back from dialysis, alert and oriented. C/O pain at insertion site of HD catheter. Vitals obtained. B/P is 185/110. Nitro drip off during diaylsis still off.Have notified doctor on call about blood pressure, diet, pain med and if we should resume nitro drip. Waiting for further orders.

## 2013-08-29 NOTE — Op Note (Signed)
OPERATIVE REPORT  Date of Surgery: 08/28/2013 - 08/29/2013  Surgeon: Tinnie Gens, MD  Assistant: Nurse  Pre-op Diagnosis: ESRD  Post-op Diagnosis: esrd  Procedure: Procedure(s): #1 bilateral ultrasound localization internal jugular veins #2 insertion of tunneled hemodialysis catheter right IJ-Diateck-19 cm  Anesthesia: LMA  EBL: Minimal  Complications: None  Procedure Details: The patient was taken to the operating room placed in the Trendelenburg position the upper chest and neck were exposed. Both internal jugular veins are imaged using B-mode ultrasound with the SonoSite. Right was larger than left. After prepping and draping in routine sterile manner the right internal jugular vein was entered using a supraclavicular approach guidewire passed into the right atrium under fluoroscopic guidance. After dilating the tract appropriately a 19 cm tunneled hemodialysis catheter was passed through peel-away sheath positioned in the right atrium tone peripherally and secured with nylon sutures. The wound was closed with Vicryl in a subcuticular fashion sterile dressing applied patient taken to the recovery room in stable condition for chest x-ray   Tinnie Gens, MD 08/29/2013 2:20 PM
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
## 2013-08-29 NOTE — OR Nursing (Signed)
pcxr  Done post insertion diatek

## 2013-08-29 NOTE — Progress Notes (Signed)
Medical Student Daily Progress Note  This is a 43 y.o. year old male on hospital day 2 admitted for hypertensive emergency with chest pain, SOB, headache, also with a history of CHF, CKD stage 5, cirrhosis, HCV, OSA (no CPAP), and substance abuse. Overnight he was managed jointly by the general internal medicine and critical care teams with nephrology involvement as well. His hypertension and CHF decompensation were managed acutely with IV hydralazine, nitroglycerine gtt, and furosemide gtt; his home clonidine, carvedilol, amlodipine, and hydralazine were then restarted. He was supposed to have an HD catheter placed yesterday but was sent to the ED 2/2 his symptoms instead.  Subjective: Patrick Anthony is feeling better than yesterday but still feels "crummy". Today he complains of minimal chest pressure and intermittent palpitations but no chest pain, only mild persistent shortness of breath with much less orthopnea. He denies nausea. He complains of a somewhat improved, 6-7/10 frontal headache.   Objective: Vital signs in last 24 hours: Filed Vitals:   08/29/13 0806 08/29/13 0900 08/29/13 1000 08/29/13 1022  BP:  176/103 179/108 179/108  Pulse:  78 83 79  Temp: 98 F (36.7 C)     TempSrc: Oral     Resp:  17 16   Height:      Weight:      SpO2:  98% 100%    Filed Weights   08/28/13 1835  Weight: 78.5 kg (173 lb 1 oz)     Intake/Output Summary (Last 24 hours) at 08/29/13 1030 Last data filed at 08/29/13 0830  Gross per 24 hour  Intake    140 ml  Output   1750 ml  Net  -1610 ml    Physical Exam: GENERAL: reclining in bed with the lights down, NAD NEURO: alert and oriented x3. PERRLA. EOM intact. 5-/5 strength in UE, 5/5 strength in LE. CN grossly intact.  HEENT: Scissors/AT. Mild photosensitivity. Oropharynx without erythema or exudate. CHEST: RRR, II/VI systolic murmur. No rubs or gallops.  ABDOMEN: Hypoactive bowel sounds. Soft, with mild tenderness over RUQ but otherwise nontender.  Nondistended. EXTREMITIES: AV fistula in R antecubital fossa, previously ligated. Trace pretibial edema bilaterally.    Lab Results: Results for orders placed during the hospital encounter of 08/28/13 (from the past 24 hour(s))  CBC WITH DIFFERENTIAL     Status: Abnormal   Collection Time    08/28/13 12:53 PM      Result Value Range   WBC 7.0  4.0 - 10.5 K/uL   RBC 2.70 (*) 4.22 - 5.81 MIL/uL   Hemoglobin 6.8 (*) 13.0 - 17.0 g/dL   HCT 20.4 (*) 39.0 - 52.0 %   MCV 75.6 (*) 78.0 - 100.0 fL   MCH 25.2 (*) 26.0 - 34.0 pg   MCHC 33.3  30.0 - 36.0 g/dL   RDW 18.4 (*) 11.5 - 15.5 %   Platelets 205  150 - 400 K/uL   Neutrophils Relative % 79 (*) 43 - 77 %   Neutro Abs 5.5  1.7 - 7.7 K/uL   Lymphocytes Relative 12  12 - 46 %   Lymphs Abs 0.8  0.7 - 4.0 K/uL   Monocytes Relative 8  3 - 12 %   Monocytes Absolute 0.6  0.1 - 1.0 K/uL   Eosinophils Relative 1  0 - 5 %   Eosinophils Absolute 0.1  0.0 - 0.7 K/uL   Basophils Relative 0  0 - 1 %   Basophils Absolute 0.0  0.0 - 0.1 K/uL  COMPREHENSIVE  METABOLIC PANEL     Status: Abnormal   Collection Time    08/28/13 12:53 PM      Result Value Range   Sodium 140  137 - 147 mEq/L   Potassium 3.4 (*) 3.7 - 5.3 mEq/L   Chloride 101  96 - 112 mEq/L   CO2 15 (*) 19 - 32 mEq/L   Glucose, Bld 91  70 - 99 mg/dL   BUN 114 (*) 6 - 23 mg/dL   Creatinine, Ser 15.60 (*) 0.50 - 1.35 mg/dL   Calcium 8.2 (*) 8.4 - 10.5 mg/dL   Total Protein 7.3  6.0 - 8.3 g/dL   Albumin 3.1 (*) 3.5 - 5.2 g/dL   AST 18  0 - 37 U/L   ALT 16  0 - 53 U/L   Alkaline Phosphatase 35 (*) 39 - 117 U/L   Total Bilirubin 0.3  0.3 - 1.2 mg/dL   GFR calc non Af Amer 3 (*) >90 mL/min   GFR calc Af Amer 4 (*) >90 mL/min  PRO B NATRIURETIC PEPTIDE     Status: Abnormal   Collection Time    08/28/13 12:53 PM      Result Value Range   Pro B Natriuretic peptide (BNP) 40974.0 (*) 0 - 125 pg/mL  URINALYSIS, ROUTINE W REFLEX MICROSCOPIC     Status: Abnormal   Collection Time     08/28/13  2:26 PM      Result Value Range   Color, Urine YELLOW  YELLOW   APPearance HAZY (*) CLEAR   Specific Gravity, Urine 1.017  1.005 - 1.030   pH 6.0  5.0 - 8.0   Glucose, UA 100 (*) NEGATIVE mg/dL   Hgb urine dipstick SMALL (*) NEGATIVE   Bilirubin Urine NEGATIVE  NEGATIVE   Ketones, ur NEGATIVE  NEGATIVE mg/dL   Protein, ur >300 (*) NEGATIVE mg/dL   Urobilinogen, UA 0.2  0.0 - 1.0 mg/dL   Nitrite NEGATIVE  NEGATIVE   Leukocytes, UA SMALL (*) NEGATIVE  URINE MICROSCOPIC-ADD ON     Status: Abnormal   Collection Time    08/28/13  2:26 PM      Result Value Range   Squamous Epithelial / LPF RARE  RARE   WBC, UA 11-20  <3 WBC/hpf   RBC / HPF 3-6  <3 RBC/hpf   Bacteria, UA FEW (*) RARE   Casts HYALINE CASTS (*) NEGATIVE  PREPARE RBC (CROSSMATCH)     Status: None   Collection Time    08/28/13  3:25 PM      Result Value Range   Order Confirmation ORDER PROCESSED BY BLOOD BANK    TYPE AND SCREEN     Status: None   Collection Time    08/28/13  4:05 PM      Result Value Range   ABO/RH(D) O POS     Antibody Screen NEG     Sample Expiration 08/31/2013     Unit Number [id number]     Blood Component Type RED CELLS,LR     Unit division 00     Status of Unit ALLOCATED     Transfusion Status OK TO TRANSFUSE     Crossmatch Result Compatible     Unit Number [id number]     Blood Component Type RED CELLS,LR     Unit division 00     Status of Unit ALLOCATED     Transfusion Status OK TO TRANSFUSE     Crossmatch Result Compatible    ABO/RH  Status: None   Collection Time    08/28/13  4:05 PM      Result Value Range   ABO/RH(D) O POS    MRSA PCR SCREENING     Status: None   Collection Time    08/28/13  6:22 PM      Result Value Range   MRSA by PCR NEGATIVE  NEGATIVE  TROPONIN I     Status: None   Collection Time    08/28/13  9:07 PM      Result Value Range   Troponin I <0.30  <0.30 ng/mL  TSH     Status: None   Collection Time    08/28/13  9:07 PM      Result  Value Range   TSH 2.006  0.350 - 4.500 uIU/mL  HIV ANTIBODY (ROUTINE TESTING)     Status: None   Collection Time    08/28/13  9:07 PM      Result Value Range   HIV NON REACTIVE  NON REACTIVE  HEPATITIS PANEL, ACUTE     Status: Abnormal   Collection Time    08/28/13  9:07 PM      Result Value Range   Hepatitis B Surface Ag NEGATIVE  NEGATIVE   HCV Ab Reactive (*) NEGATIVE   Hep A IgM NON REACTIVE  NON REACTIVE   Hep B C IgM NON REACTIVE  NON REACTIVE  LIPID PANEL     Status: Abnormal   Collection Time    08/28/13  9:07 PM      Result Value Range   Cholesterol 165  0 - 200 mg/dL   Triglycerides 126  <150 mg/dL   HDL 32 (*) >39 mg/dL   Total CHOL/HDL Ratio 5.2     VLDL 25  0 - 40 mg/dL   LDL Cholesterol 108 (*) 0 - 99 mg/dL  URINE RAPID DRUG SCREEN (HOSP PERFORMED)     Status: None   Collection Time    08/29/13  1:06 AM      Result Value Range   Opiates NONE DETECTED  NONE DETECTED   Cocaine NONE DETECTED  NONE DETECTED   Benzodiazepines NONE DETECTED  NONE DETECTED   Amphetamines NONE DETECTED  NONE DETECTED   Tetrahydrocannabinol NONE DETECTED  NONE DETECTED   Barbiturates NONE DETECTED  NONE DETECTED  RENAL FUNCTION PANEL     Status: Abnormal   Collection Time    08/29/13  2:45 AM      Result Value Range   Sodium 142  137 - 147 mEq/L   Potassium 3.3 (*) 3.7 - 5.3 mEq/L   Chloride 102  96 - 112 mEq/L   CO2 14 (*) 19 - 32 mEq/L   Glucose, Bld 97  70 - 99 mg/dL   BUN 118 (*) 6 - 23 mg/dL   Creatinine, Ser 15.86 (*) 0.50 - 1.35 mg/dL   Calcium 8.1 (*) 8.4 - 10.5 mg/dL   Phosphorus 6.7 (*) 2.3 - 4.6 mg/dL   Albumin 2.8 (*) 3.5 - 5.2 g/dL   GFR calc non Af Amer 3 (*) >90 mL/min   GFR calc Af Amer 4 (*) >90 mL/min  TROPONIN I     Status: None   Collection Time    08/29/13  2:45 AM      Result Value Range   Troponin I <0.30  <0.30 ng/mL  CBC     Status: Abnormal   Collection Time    08/29/13  2:45 AM  Result Value Range   WBC 6.4  4.0 - 10.5 K/uL   RBC 2.42  (*) 4.22 - 5.81 MIL/uL   Hemoglobin 6.2 (*) 13.0 - 17.0 g/dL   HCT 18.4 (*) 39.0 - 52.0 %   MCV 76.0 (*) 78.0 - 100.0 fL   MCH 25.6 (*) 26.0 - 34.0 pg   MCHC 33.7  30.0 - 36.0 g/dL   RDW 18.0 (*) 11.5 - 15.5 %   Platelets 182  150 - 400 K/uL  MAGNESIUM     Status: None   Collection Time    08/29/13  2:45 AM      Result Value Range   Magnesium 2.0  1.5 - 2.5 mg/dL  PROTIME-INR     Status: None   Collection Time    08/29/13  2:45 AM      Result Value Range   Prothrombin Time 15.0  11.6 - 15.2 seconds   INR 1.21  0.00 - 1.49  APTT     Status: Abnormal   Collection Time    08/29/13  2:45 AM      Result Value Range   aPTT 38 (*) 24 - 37 seconds  TROPONIN I     Status: None   Collection Time    08/29/13  8:15 AM      Result Value Range   Troponin I <0.30  <0.30 ng/mL    Micro Results: Recent Results (from the past 240 hour(s))  MRSA PCR SCREENING     Status: None   Collection Time    08/28/13  6:22 PM      Result Value Range Status   MRSA by PCR NEGATIVE  NEGATIVE Final   Comment:            The GeneXpert MRSA Assay (FDA     approved for NASAL specimens     only), is one component of a     comprehensive MRSA colonization     surveillance program. It is not     intended to diagnose MRSA     infection nor to guide or     monitor treatment for     MRSA infections.    Studies/Results: Dg Chest 2 View  08/28/2013   CLINICAL DATA:  Chest pain and shortness of breath.  EXAM: CHEST  2 VIEW  COMPARISON:  PA and lateral chest 04/19/2013.  FINDINGS: There is cardiomegaly with hazy bilateral perihilar opacities. No pneumothorax or pleural fluid.  IMPRESSION: Cardiomegaly with bilateral perihilar opacities which could be due to pulmonary edema or pneumonia.   Electronically Signed   By: Inge Rise M.D.   On: 08/28/2013 13:49    Medications:  Scheduled Meds: . amLODipine  10 mg Oral Daily  . calcitRIOL  0.25 mcg Oral Daily  . carvedilol  25 mg Oral BID WC  . cloNIDine   0.2 mg Oral BID  . furosemide  200 mg Intravenous Q6H  . hydrALAZINE  100 mg Oral Q6H  . multivitamin  1 tablet Oral QHS   Continuous Infusions: . niCARDipine Stopped (08/28/13 2018)  . nitroGLYCERIN 50 mcg/min (08/29/13 0723)   PRN Meds:.  Assessment/Plan:  Hypertensive Emergency with CHF exacerbation: BP 220s/150s on admission, 170s/100s now. Systolic and diastolic dysfunction with EF 40-45%, SOB on admission with crackles on exam and likely pulm edema on CXR. Initially managed with IV hydralazine, NTG gtt, Lasix gtt. Trop neg x 3. Restarted home po hydralazine, carvedilol, amlodipine, and clonidine. Symptoms improving quickly. - continue NTG gtt,  currently at 50 mcg/min - continue furosemide gtt at 4 mg/min - amlodipine 10 mg po daily - carvedilol 25 mg po BID - clonidine 0.2 mg po BID - hydralazine 100 mg po QID - caution with additional volume.  AoCKD stage V: Cr 15.8 up from 7 in December. Previously had R arm AV fistula placed 08/2012 which did not mature and was ligated in 10/2012.  - IR to place temp cath today 1/23 - per nephrology, HD daily x 3 days then outpatient HD - plan for L arm access placement Mon/Tues if patient willing (has previously refused) - MVI - calcitriol 0.25 mcg daily  Anion gap metabolic acidosis: likely 2/2 profound uremia. Has hx drug/EtOH abuse but neg UDS.  - daily BMET - if not improving with decr uremia after HD, consider toxic ingestions. No ketones in urine, no hx DM so unlikely AKA, SKA, or DKA. Salicylate toxicity unlikely.   Anemia: Baseline hemoglobin 11.7 in October but has previously hovered around 8; Hgb 6.2 today. Likely due to underproduction 2/2 low erythropoietin production but possibly some degree of blood loss.  - transfuse 1 unit during HD given CHF, concern for volume overload. - FOBT to assess for GI blood loss  Cirrhosis likely 2/2 HCV: Hepatitis panel otherwise negative; HIV NR. ALP 35, AST/ALT wnl. - caution with  hepatically metabolized med dosing. Otherwise NTD.    LOS: 1 day   This is a Careers information officer Note.  The care of the patient was discussed with Dr. Eula Fried and the assessment and plan formulated with their assistance.  Please see their attached note for official documentation of the daily encounter.  Patrick Anthony 08/29/2013, 10:30 AM

## 2013-08-29 NOTE — Progress Notes (Signed)
Patient has returned from PACU alert and oriented. Dialysis catheter placed on rt. Side of chest. Vitals obtained, and patient now has just gone to dialysis in his bed.

## 2013-08-29 NOTE — Progress Notes (Signed)
Subjective: Mr. Holz was seen and examined at bedside during HD.  He is s/p tunneled HD cath RIJ today.  BP improved since admission. Less headache.   Objective: Vital signs in last 24 hours: Filed Vitals:   08/29/13 1630 08/29/13 1645 08/29/13 1700 08/29/13 1715  BP: 178/114 157/101 176/113 185/111  Pulse: 66 69 69 70  Temp: 97.5 F (36.4 C) 97.5 F (36.4 C) 97.5 F (36.4 C)   TempSrc: Oral Oral Oral   Resp: 12 24 16 14   Height:      Weight:      SpO2: 100% 99% 100% 100%   Weight change:   Intake/Output Summary (Last 24 hours) at 08/29/13 1719 Last data filed at 08/29/13 1715  Gross per 24 hour  Intake    615 ml  Output   2150 ml  Net  -1535 ml   Vitals reviewed. General: resting in bed, acute distress due to pain HEENT: PERRL, EOMI Cardiac: RRR, +SEM  Pulm: clear to auscultation anterior examination only  Abd: soft, nontender, nondistended, BS present Ext: warm and well perfused, trace lower extremity edema Neuro: alert and oriented X3, cranial nerves II-XII grossly intact, strength equal in bilateral upper and lower extremities  Lab Results: Basic Metabolic Panel:  Recent Labs Lab 08/28/13 1253 08/29/13 0245  NA 140 142  K 3.4* 3.3*  CL 101 102  CO2 15* 14*  GLUCOSE 91 97  BUN 114* 118*  CREATININE 15.60* 15.86*  CALCIUM 8.2* 8.1*  MG  --  2.0  PHOS  --  6.7*   Liver Function Tests:  Recent Labs Lab 08/28/13 1253 08/29/13 0245  AST 18  --   ALT 16  --   ALKPHOS 35*  --   BILITOT 0.3  --   PROT 7.3  --   ALBUMIN 3.1* 2.8*   CBC:  Recent Labs Lab 08/28/13 1253 08/29/13 0245  WBC 7.0 6.4  NEUTROABS 5.5  --   HGB 6.8* 6.2*  HCT 20.4* 18.4*  MCV 75.6* 76.0*  PLT 205 182   Cardiac Enzymes:  Recent Labs Lab 08/28/13 2107 08/29/13 0245 08/29/13 0815  TROPONINI <0.30 <0.30 <0.30   BNP:  Recent Labs Lab 08/28/13 1253  PROBNP 40974.0*   Hemoglobin A1C:  Recent Labs Lab 08/28/13 2107  HGBA1C 4.6   Fasting Lipid  Panel:  Recent Labs Lab 08/28/13 2107  CHOL 165  HDL 32*  LDLCALC 108*  TRIG 126  CHOLHDL 5.2   Thyroid Function Tests:  Recent Labs Lab 08/28/13 2107  TSH 2.006   Coagulation:  Recent Labs Lab 08/29/13 0245  LABPROT 15.0  INR 1.21   Urine Drug Screen: Drugs of Abuse     Component Value Date/Time   LABOPIA NONE DETECTED 08/29/2013 0106   COCAINSCRNUR NONE DETECTED 08/29/2013 0106   LABBENZ NONE DETECTED 08/29/2013 0106   AMPHETMU NONE DETECTED 08/29/2013 0106   THCU NONE DETECTED 08/29/2013 0106   LABBARB NONE DETECTED 08/29/2013 0106    Urinalysis:  Recent Labs Lab 08/28/13 1426  COLORURINE YELLOW  LABSPEC 1.017  PHURINE 6.0  GLUCOSEU 100*  HGBUR SMALL*  BILIRUBINUR NEGATIVE  KETONESUR NEGATIVE  PROTEINUR >300*  UROBILINOGEN 0.2  NITRITE NEGATIVE  LEUKOCYTESUR SMALL*   Micro Results: Recent Results (from the past 240 hour(s))  URINE CULTURE     Status: None   Collection Time    08/28/13  2:26 PM      Result Value Range Status   Specimen Description URINE, CLEAN CATCH  Final   Special Requests NONE   Final   Culture  Setup Time     Final   Value: 08/28/2013 15:51     Performed at [place]     Final   Value: NO GROWTH     Performed at [facility]   Culture     Final   Value: NO GROWTH     Performed at [facility]   Report Status 08/29/2013 FINAL   Final  MRSA PCR SCREENING     Status: None   Collection Time    08/28/13  6:22 PM      Result Value Range Status   MRSA by PCR NEGATIVE  NEGATIVE Final   Comment:            The GeneXpert MRSA Assay (FDA     approved for NASAL specimens     only), is one component of a     comprehensive MRSA colonization     surveillance program. It is not     intended to diagnose MRSA     infection nor to guide or     monitor treatment for     MRSA infections.   Studies/Results: Dg Chest 2 View  08/28/2013   CLINICAL DATA:  Chest pain and shortness of breath.   EXAM: CHEST  2 VIEW  COMPARISON:  PA and lateral chest 04/19/2013.  FINDINGS: There is cardiomegaly with hazy bilateral perihilar opacities. No pneumothorax or pleural fluid.  IMPRESSION: Cardiomegaly with bilateral perihilar opacities which could be due to pulmonary edema or pneumonia.   Electronically Signed   By: Inge Rise M.D.   On: 08/28/2013 13:49   Dg Chest Port 1 View  08/29/2013   CLINICAL DATA:  Dialysis catheter placement.  EXAM: PORTABLE CHEST - 1 VIEW  COMPARISON:  08/28/2013  FINDINGS: Technical malfunction resulted in incomplete imaging of the lung bases.  There has been interval placement of a right jugular dual-lumen central venous catheter with tips overlying the lower SVC. Moderate cardiomegaly remains. The lungs are well inflated. Patchy perihilar opacities have slightly improved from the prior study. There is no evidence of new airspace consolidation, large pleural effusion, or pneumothorax.  IMPRESSION: 1. Interval right jugular CVC placement as above. 2. Cardiomegaly. 3. Slight interval improvement of perihilar opacities, suggestive of improved, mild edema.   Electronically Signed   By: Logan Bores   On: 08/29/2013 15:00   Medications: I have reviewed the patient's current medications. Scheduled Meds: . [START ON 08/30/2013] amLODipine  10 mg Oral QHS  . calcitRIOL  0.25 mcg Oral Daily  . carvedilol  50 mg Oral BID WC  . cloNIDine  0.2 mg Oral BID  . darbepoetin (ARANESP) injection - DIALYSIS  200 mcg Intravenous Q Fri-HD  . cefUROXime (ZINACEF) 1.5 GM IVPB      . hydrALAZINE  100 mg Oral Q8H  . multivitamin  1 tablet Oral QHS  . [START ON 08/30/2013] multivitamin with minerals  1 tablet Oral Daily  . [START ON 09/02/2013] Vitamin D (Ergocalciferol)  50,000 Units Oral Q7 days   Continuous Infusions: . sodium chloride 35 mL/hr (08/29/13 1213)  . niCARDipine Stopped (08/28/13 2018)  . nitroGLYCERIN 50 mcg/min (08/29/13 1000)   PRN  Meds:.nitroGLYCERIN Assessment/Plan: Principal Problem:   Hypertensive emergency Active Problems:   Systolic and diastolic CHF, acute on chronic   CKD (chronic kidney disease), stage V   Headache(784.0)   Chest pain Mr. Cletus Gash  is a 43 year old male with uncontrolled HTN, CKD stage 5, and combined CHF admitted for hypertensive emergency.   Hypertensive emergency--secondary to medication non-compliance in setting of acute on chronic CKD stage 5 and combined systolic and diastolic CHF. BP improved today after nitro gtt, restarting home PO meds, and also receiving IV hydralazine dose yesterday.  Headache and nausea improved, less chest pressure today. Cardiac enzyme x3 negative.  Initially was to be transferred to ICU, however with improved BP, remained in SDU. Renal has been following.  TSH 2.006, HbA1C 4.6, LDL 108, HDL 32. UDS negative.  -HD today, goal to lower volume as much as possible and hopefully lower meds as well -AM labs: renal function panel and CBC  -o2 [place], keep o2 sats >92%   Acute on chronic renal failure in setting of CKD 5 and uremia but non-oliguric. S/P tunneled HD cath 1/23.   -HD started today, continue home BP medications -trend renal function   Anion gap metabolic acidosis in setting of uremia with CKD stage 5--AG 26 today, was 27 on admission. Should improve with HD  Hepatitis C--Reactive Ab.  HIV negative. Need to obtain records   Anemia--likely setting to chronic disease with ESRD. Hb 6.8 on admission, was 11.7 in October 2014. Down to 6.2 today.  Iron 112, Ferritin 697, and TIBC 296 07/2013.  -Transfuse 2 units with HD today -follow up post transfusion Hb -fobt -trend CBC   Acute on chronic diastolic and systolic heart failure--EF 40-45% with diffuse hypokinesis and grade 2 DD per echo 04/2013.  -back on home BP medications -HD started today  Diet: Renal DVT Ppx: SCDs Dispo: Disposition is deferred at this time, awaiting improvement of current medical  problems.  Anticipated discharge in approximately 2-3 day(s).   The patient does not have a current PCP (No Pcp Per Patient) and does need an [facility] hospital follow-up appointment after discharge.  The patient does not have transportation limitations that hinder transportation to clinic appointments.  Services Needed at time of discharge: Y = Yes, Blank = No PT:   OT:   RN:   Equipment:   Other:     LOS: 1 day   Jerene Pitch, MD 08/29/2013, 5:19 PM

## 2013-08-29 NOTE — Progress Notes (Signed)
Pt received two units of PRBCs; tolerated transfusion well; denies SOB, itching, back pain or dizziness.

## 2013-08-29 NOTE — Progress Notes (Signed)
Utilization review completed. Tyyne Cliett, RN, BSN. 

## 2013-08-29 NOTE — Procedures (Signed)
I was present at this session.  I have reviewed the session itself and made appropriate changes.  [place], at risk for dysequilibrium.  bp ^ but will fall, cut off NTG.  R IJ cath  [name] L 1/23/20153:17 PM

## 2013-08-29 NOTE — Interval H&P Note (Signed)
History and Physical Interval Note:  08/29/2013 1:38 PM  Patrick Anthony  has presented today for surgery, with the diagnosis of ESRD  The various methods of treatment have been discussed with the patient and family. After consideration of risks, benefits and other options for treatment, the patient has consented to  Procedure(s): INSERTION OF DIALYSIS CATHETER (N/A) as a surgical intervention .  The patient's history has been reviewed, patient examined, no change in status, stable for surgery.  I have reviewed the patient's chart and labs.  Questions were answered to the patient's satisfaction.     Tinnie Gens

## 2013-08-30 DIAGNOSIS — D631 Anemia in chronic kidney disease: Secondary | ICD-10-CM | POA: Diagnosis present

## 2013-08-30 DIAGNOSIS — Z992 Dependence on renal dialysis: Secondary | ICD-10-CM

## 2013-08-30 DIAGNOSIS — N189 Chronic kidney disease, unspecified: Secondary | ICD-10-CM | POA: Diagnosis present

## 2013-08-30 DIAGNOSIS — B1920 Unspecified viral hepatitis C without hepatic coma: Secondary | ICD-10-CM | POA: Diagnosis present

## 2013-08-30 DIAGNOSIS — N186 End stage renal disease: Secondary | ICD-10-CM | POA: Diagnosis not present

## 2013-08-30 LAB — CBC
HCT: 23.6 % — ABNORMAL LOW (ref 39.0–52.0)
Hemoglobin: 8 g/dL — ABNORMAL LOW (ref 13.0–17.0)
MCH: 25.9 pg — ABNORMAL LOW (ref 26.0–34.0)
MCHC: 33.9 g/dL (ref 30.0–36.0)
MCV: 76.4 fL — ABNORMAL LOW (ref 78.0–100.0)
Platelets: 168 10*3/uL (ref 150–400)
RBC: 3.09 MIL/uL — ABNORMAL LOW (ref 4.22–5.81)
RDW: 17.8 % — ABNORMAL HIGH (ref 11.5–15.5)
WBC: 6 10*3/uL (ref 4.0–10.5)

## 2013-08-30 LAB — TYPE AND SCREEN
ABO/RH(D): O POS
Antibody Screen: NEGATIVE
Unit division: 0
Unit division: 0

## 2013-08-30 LAB — RENAL FUNCTION PANEL
Albumin: 2.8 g/dL — ABNORMAL LOW (ref 3.5–5.2)
BUN: 81 mg/dL — ABNORMAL HIGH (ref 6–23)
CO2: 21 mEq/L (ref 19–32)
Calcium: 8.1 mg/dL — ABNORMAL LOW (ref 8.4–10.5)
Chloride: 98 mEq/L (ref 96–112)
Creatinine, Ser: 11.88 mg/dL — ABNORMAL HIGH (ref 0.50–1.35)
GFR calc Af Amer: 5 mL/min — ABNORMAL LOW (ref >90)
GFR calc non Af Amer: 5 mL/min — ABNORMAL LOW (ref >90)
Glucose, Bld: 88 mg/dL (ref 70–99)
Phosphorus: 5.4 mg/dL — ABNORMAL HIGH (ref 2.3–4.6)
Potassium: 3.2 mEq/L — ABNORMAL LOW (ref 3.7–5.3)
Sodium: 138 mEq/L (ref 137–147)

## 2013-08-30 LAB — IRON AND TIBC
Iron: 144 ug/dL — ABNORMAL HIGH (ref 42–135)
Saturation Ratios: 53 % (ref 20–55)
TIBC: 272 ug/dL (ref 215–435)
UIBC: 128 ug/dL (ref 125–400)

## 2013-08-30 LAB — FERRITIN: Ferritin: 551 ng/mL — ABNORMAL HIGH (ref 22–322)

## 2013-08-30 MED ORDER — NEPRO/CARBSTEADY PO LIQD: 237.0000 mL | ORAL | Status: DC | PRN

## 2013-08-30 MED ORDER — LIDOCAINE-PRILOCAINE 2.5-2.5 % EX CREA: 1.0000 "application " | TOPICAL_CREAM | CUTANEOUS | Status: DC | PRN

## 2013-08-30 MED ORDER — SODIUM CHLORIDE 0.9 % IV SOLN: 100.0000 mL | INTRAVENOUS | Status: DC | PRN

## 2013-08-30 MED ORDER — HEPARIN SODIUM (PORCINE) 1000 UNIT/ML DIALYSIS: 100.0000 [IU]/kg | INTRAMUSCULAR | Status: DC | PRN

## 2013-08-30 MED ORDER — ALTEPLASE 2 MG IJ SOLR: 2.0000 mg | Freq: Once | INTRAMUSCULAR | Status: DC | PRN

## 2013-08-30 MED ORDER — LIDOCAINE HCL (PF) 1 % IJ SOLN: 5.0000 mL | INTRAMUSCULAR | Status: DC | PRN

## 2013-08-30 MED ORDER — PENTAFLUOROPROP-TETRAFLUOROETH EX AERO: 1.0000 "application " | INHALATION_SPRAY | CUTANEOUS | Status: DC | PRN

## 2013-08-30 MED ORDER — BOOST / RESOURCE BREEZE PO LIQD
1.0000 | Freq: Two times a day (BID) | ORAL | Status: DC
  Administered 2013-08-30 – 2013-09-02 (×7): 1 via ORAL

## 2013-08-30 MED ORDER — HEPARIN SODIUM (PORCINE) 1000 UNIT/ML DIALYSIS: 1000.0000 [IU] | INTRAMUSCULAR | Status: DC | PRN

## 2013-08-30 MED ORDER — POTASSIUM CHLORIDE CRYS ER 20 MEQ PO TBCR
20.0000 meq | EXTENDED_RELEASE_TABLET | Freq: Once | ORAL | Status: AC
  Administered 2013-08-30: 20 meq via ORAL

## 2013-08-30 MED ORDER — DOXERCALCIFEROL 4 MCG/2ML IV SOLN
4.0000 ug | INTRAVENOUS | Status: DC
  Administered 2013-09-03: 4 ug via INTRAVENOUS
  Filled 2013-08-30 (×2): qty 2

## 2013-08-30 MED ORDER — HEPARIN SODIUM (PORCINE) 1000 UNIT/ML DIALYSIS: 20.0000 [IU]/kg | INTRAMUSCULAR | Status: DC | PRN

## 2013-08-30 MED ORDER — OXYCODONE HCL 5 MG PO TABS
5.0000 mg | ORAL_TABLET | Freq: Four times a day (QID) | ORAL | Status: DC | PRN
  Administered 2013-08-30 – 2013-09-02 (×7): 5 mg via ORAL
  Filled 2013-08-30 (×7): qty 1

## 2013-08-30 MED ORDER — POTASSIUM CHLORIDE CRYS ER 20 MEQ PO TBCR
40.0000 meq | EXTENDED_RELEASE_TABLET | Freq: Two times a day (BID) | ORAL | Status: AC
  Administered 2013-08-30: 40 meq via ORAL
  Filled 2013-08-30 (×2): qty 2

## 2013-08-30 MED ORDER — CALCIUM ACETATE 667 MG PO CAPS
667.0000 mg | ORAL_CAPSULE | Freq: Three times a day (TID) | ORAL | Status: DC
  Administered 2013-08-30 – 2013-09-01 (×8): 667 mg via ORAL
  Filled 2013-08-30 (×13): qty 1

## 2013-08-30 MED ORDER — SODIUM CHLORIDE 0.9 % IV SOLN
100.0000 mL | INTRAVENOUS | Status: DC | PRN
Start: 2013-08-30 — End: 2013-08-30

## 2013-08-30 NOTE — Progress Notes (Signed)
BP 186/106, HR 86 NSR. MD notified. Ordered to discontinue nitro gtt. Instructed to call if SBP >200 and DSP >110. Will continue to monitor.   Lum Babe, RN

## 2013-08-30 NOTE — Progress Notes (Signed)
Subjective: Mr. Disantis was seen and examined at bedside this morning during HD. He reports no chest pain or shortness of breath.  He has some nausea but no vomiting, diarrhea, or constipation. He continues to urinate.   Objective: Vital signs in last 24 hours: Filed Vitals:   08/30/13 0334 08/30/13 0500 08/30/13 0701 08/30/13 0710  BP: 177/111 170/118 169/109   Pulse: 70 70 78   Temp: 98.2 F (36.8 C)  98 F (36.7 C) 97.8 F (36.6 C)  TempSrc: Oral  Oral Oral  Resp: 12 14 19    Height:      Weight:    164 lb 14.5 oz (74.8 kg)  SpO2: 99% 96% 99% 100%   Weight change: -8 lb 9.6 oz (-3.9 kg)  Intake/Output Summary (Last 24 hours) at 08/30/13 0744 Last data filed at 08/30/13 0600  Gross per 24 hour  Intake   1400 ml  Output   3400 ml  Net  -2000 ml   Vitals reviewed. General: resting in bed, NAD HEENT: PERRL, EOMI Cardiac: RRR, +SEM 2/6 left sternal burder  Pulm: clear to auscultation b/l Abd: soft, nontender, nondistended, BS present Ext: warm and well perfused, -edema, +2dp b/l Neuro: alert and oriented X3, cranial nerves II-XII grossly intact, strength equal in bilateral upper and lower extremities  Lab Results: Basic Metabolic Panel:  Recent Labs Lab 08/29/13 0245 08/29/13 2147 08/30/13 0330  NA 142 138 138  K 3.3* 3.1* 3.2*  CL 102 98 98  CO2 14* 22 21  GLUCOSE 97 150* 88  BUN 118* 78* 81*  CREATININE 15.86* 11.52* 11.88*  CALCIUM 8.1* 8.2* 8.1*  MG 2.0  --   --   PHOS 6.7*  --  5.4*   Liver Function Tests:  Recent Labs Lab 08/28/13 1253 08/29/13 0245 08/30/13 0330  AST 18  --   --   ALT 16  --   --   ALKPHOS 35*  --   --   BILITOT 0.3  --   --   PROT 7.3  --   --   ALBUMIN 3.1* 2.8* 2.8*   CBC:  Recent Labs Lab 08/28/13 1253 08/29/13 0245 08/29/13 2147 08/30/13 0500  WBC 7.0 6.4  --  6.0  NEUTROABS 5.5  --   --   --   HGB 6.8* 6.2* 8.2* 8.0*  HCT 20.4* 18.4* 24.3* 23.6*  MCV 75.6* 76.0*  --  76.4*  PLT 205 182  --  168    Cardiac Enzymes:  Recent Labs Lab 08/28/13 2107 08/29/13 0245 08/29/13 0815  TROPONINI <0.30 <0.30 <0.30   BNP:  Recent Labs Lab 08/28/13 1253  PROBNP 40974.0*   Hemoglobin A1C:  Recent Labs Lab 08/28/13 2107  HGBA1C 4.6   Fasting Lipid Panel:  Recent Labs Lab 08/28/13 2107  CHOL 165  HDL 32*  LDLCALC 108*  TRIG 126  CHOLHDL 5.2   Thyroid Function Tests:  Recent Labs Lab 08/28/13 2107  TSH 2.006   Coagulation:  Recent Labs Lab 08/29/13 0245  LABPROT 15.0  INR 1.21   Urine Drug Screen: Drugs of Abuse     Component Value Date/Time   LABOPIA NONE DETECTED 08/29/2013 0106   COCAINSCRNUR NONE DETECTED 08/29/2013 0106   LABBENZ NONE DETECTED 08/29/2013 0106   AMPHETMU NONE DETECTED 08/29/2013 0106   THCU NONE DETECTED 08/29/2013 0106   LABBARB NONE DETECTED 08/29/2013 0106    Urinalysis:  Recent Labs Lab 08/28/13 [place]  LABSPEC 1.017  PHURINE  6.0  GLUCOSEU 100*  HGBUR SMALL*  BILIRUBINUR NEGATIVE  KETONESUR NEGATIVE  PROTEINUR >300*  UROBILINOGEN 0.2  NITRITE NEGATIVE  LEUKOCYTESUR SMALL*   Micro Results: Recent Results (from the past 240 hour(s))  URINE CULTURE     Status: None   Collection Time    08/28/13  2:26 PM      Result Value Range Status   Specimen Description URINE, CLEAN CATCH   Final   Special Requests NONE   Final   Culture  Setup Time     Final   Value: 08/28/2013 15:51     Performed at [facility]     Final   Value: NO GROWTH     Performed at [facility]   Culture     Final   Value: NO GROWTH     Performed at [facility]   Report Status 08/29/2013 FINAL   Final  MRSA PCR SCREENING     Status: None   Collection Time    08/28/13  6:22 PM      Result Value Range Status   MRSA by PCR NEGATIVE  NEGATIVE Final   Comment:            The GeneXpert MRSA Assay (FDA     approved for NASAL specimens     only), is one component of a     comprehensive MRSA  colonization     surveillance program. It is not     intended to diagnose MRSA     infection nor to guide or     monitor treatment for     MRSA infections.   Studies/Results: Dg Chest 2 View  08/28/2013   CLINICAL DATA:  Chest pain and shortness of breath.  EXAM: CHEST  2 VIEW  COMPARISON:  PA and lateral chest 04/19/2013.  FINDINGS: There is cardiomegaly with hazy bilateral perihilar opacities. No pneumothorax or pleural fluid.  IMPRESSION: Cardiomegaly with bilateral perihilar opacities which could be due to pulmonary edema or pneumonia.   Electronically Signed   By: Inge Rise M.D.   On: 08/28/2013 13:49   Dg Chest Port 1 View  08/29/2013   CLINICAL DATA:  Dialysis catheter placement.  EXAM: PORTABLE CHEST - 1 VIEW  COMPARISON:  08/28/2013  FINDINGS: Technical malfunction resulted in incomplete imaging of the lung bases.  There has been interval placement of a right jugular dual-lumen central venous catheter with tips overlying the lower SVC. Moderate cardiomegaly remains. The lungs are well inflated. Patchy perihilar opacities have slightly improved from the prior study. There is no evidence of new airspace consolidation, large pleural effusion, or pneumothorax.  IMPRESSION: 1. Interval right jugular CVC placement as above. 2. Cardiomegaly. 3. Slight interval improvement of perihilar opacities, suggestive of improved, mild edema.   Electronically Signed   By: Logan Bores   On: 08/29/2013 15:00   Dg Fluoro Guide Cv Line-no Report  08/29/2013   CLINICAL DATA: dialysis catheter   FLOURO GUIDE CV LINE  Fluoroscopy was utilized by the requesting physician.  No radiographic  interpretation.    Medications: I have reviewed the patient's current medications. Scheduled Meds: . amLODipine  10 mg Oral QHS  . carvedilol  50 mg Oral BID WC  . cloNIDine  0.2 mg Oral BID  . darbepoetin (ARANESP) injection - DIALYSIS  200 mcg Intravenous Q Fri-HD  . hydrALAZINE  100 mg Oral Q8H  . multivitamin  1  tablet Oral QHS  .  multivitamin with minerals  1 tablet Oral Daily  . [START ON 09/02/2013] Vitamin D (Ergocalciferol)  50,000 Units Oral Q7 days   Continuous Infusions: . sodium chloride 35 mL/hr (08/29/13 1213)   PRN Meds:.sodium chloride, sodium chloride, sodium chloride, sodium chloride, sodium chloride, sodium chloride, alteplase, alteplase, alteplase, feeding supplement (NEPRO CARB STEADY), feeding supplement (NEPRO CARB STEADY), feeding supplement (NEPRO CARB STEADY), heparin, heparin, heparin, heparin, heparin, heparin, lidocaine (PF), lidocaine (PF), lidocaine (PF), lidocaine-prilocaine, lidocaine-prilocaine, lidocaine-prilocaine, nitroGLYCERIN, oxyCODONE pentafluoroprop-tetrafluoroeth, pentafluoroprop-tetrafluoroeth, pentafluoroprop-tetrafluoroeth Assessment/Plan: Principal Problem:   Hypertensive emergency Active Problems:   Systolic and diastolic CHF, acute on chronic   CKD (chronic kidney disease), stage V   Headache(784.0)   Chest pain   Anemia in chronic kidney disease   Hepatitis C   ESRD on dialysis Mr. Rappa is a 43 year old male with uncontrolled HTN, CKD stage 5, and combined CHF admitted for hypertensive emergency.   Hypertensive emergency--secondary to medication non-compliance in setting of acute on chronic CKD stage 5 and combined systolic and diastolic CHF. Now on HD.  -will continue HD today, goal to lower volume as much as possible and hopefully lower meds as well -o2 [place], keep o2 sats >92%  -pain control prn  ESRD now on HD with uremia but non-oliguric. S/P tunneled HD cath 1/23.  Will get AVF placement likely Monday and Tuesday.  -HD started 1/23, continue home BP medications -Trend renal function  -Appreciate renal following  Anion gap metabolic acidosis in setting of uremia with ESRD-AG 27 on admission, down to 19 today. Cr 15.6 on admission, improved to 11.88 today. Now on HD.  -continue HD per renal  Hepatitis C--Reactive Ab.  HIV negative. Need to  obtain records   Anemia--likely setting to chronic disease with ESRD. Hb 6.8 on admission, was 11.7 in October 2014. Down to 6.2 today.  Iron 112, Ferritin 697, and TIBC 296 07/2013. S/p 2 units PRBC 1/23.  -Hb 8 -fobt -trend CBC  -iron panel pending  Acute on chronic diastolic and systolic heart failure--EF 40-45% with diffuse hypokinesis and grade 2 DD per echo 04/2013. SOB and chest pain resolved.  -back on home BP medications -HD started 1/23  Diet: Renal DVT Ppx: Heparin with HD, SCDs Dispo: Disposition is deferred at this time, awaiting improvement of current medical problems.  Anticipated discharge in approximately 2-3 day(s).   The patient does not have a current PCP (No Pcp Per Patient) and does need an [facility] hospital follow-up appointment after discharge.  The patient does not have transportation limitations that hinder transportation to clinic appointments.  Services Needed at time of discharge: Y = Yes, Blank = No PT:   OT:   RN:   Equipment:   Other:     LOS: 2 days   Jerene Pitch, MD 08/30/2013, 7:44 AM

## 2013-08-30 NOTE — Progress Notes (Signed)
  I have seen and examined the patient, and reviewed the daily progress note by Corey Skains, MS 4 and discussed the care of the patient with them. Please see my progress note from 08/30/2013 for further details regarding assessment and plan.    Signed:  Jerene Pitch, MD 08/30/2013, 7:56 PM

## 2013-08-30 NOTE — Progress Notes (Signed)
NUTRITION FOLLOW UP  Intervention:    Resource Breeze PO BID, each supplement provides 250 kcal and 9 grams of protein  Renal diet education completed.  Nutrition Dx:   Increased nutrient needs related to hemodialysis as evidenced by estimated needs. Ongoing.  Goal:   Intake to meet >90% of estimated nutrition needs. Progressing.  Monitor:   PO intake, labs, weight trend.  Assessment:   Pt presented to hospital for AVF placement but admitted in hypertensive emergency. Pt with uncontrolled HTN due to medical non-compliance, CKD stage 5, and CHF. Pt with hx of ETOH and heroin abuse.   S/P right IJ catheter placement on 1/23. S/P HD this AM. PO intake has improved somewhat, but poor appetite persists. Will add PO supplement.  RD consulted for Renal Education. Provided Choose-A-Meal Booklet to patient. Reviewed food groups and provided written recommended serving sizes specifically determined for patient's current nutritional status.   Explained why diet restrictions are needed and provided lists of foods to limit/avoid that are high potassium, sodium, and phosphorus. Provided specific recommendations on safer alternatives of these foods. Strongly encouraged compliance of this diet.   Discussed importance of protein intake at each meal and snack. Provided examples of how to maximize protein intake throughout the day. Discussed need for fluid restriction with dialysis, importance of minimizing weight gain between HD treatments, and renal-friendly beverage options.  Encouraged pt to discuss specific diet questions/concerns with RD at HD outpatient facility. Teach back method used.  Expect fair compliance.  Height: Ht Readings from Last 1 Encounters:  08/28/13 6' (1.829 m)    Weight Status:   Wt Readings from Last 1 Encounters:  08/30/13 160 lb 7.9 oz (72.8 kg)    Re-estimated needs:  Kcal: 2500-2700  Protein: 90-100 grams  Fluid: 1.2 L/day  Skin: no problems  Diet Order:  Renal   Intake/Output Summary (Last 24 hours) at 08/30/13 1534 Last data filed at 08/30/13 1400  Gross per 24 hour  Intake 1767.75 ml  Output   6445 ml  Net -4677.25 ml    Last BM: 1/22   Labs:   Recent Labs Lab 08/29/13 0245 08/29/13 2147 08/30/13 0330  NA 142 138 138  K 3.3* 3.1* 3.2*  CL 102 98 98  CO2 14* 22 21  BUN 118* 78* 81*  CREATININE 15.86* 11.52* 11.88*  CALCIUM 8.1* 8.2* 8.1*  MG 2.0  --   --   PHOS 6.7*  --  5.4*  GLUCOSE 97 150* 88    CBG (last 3)  No results found for this basename: GLUCAP,  in the last 72 hours  Scheduled Meds: . amLODipine  10 mg Oral QHS  . calcium acetate  667 mg Oral TID WC  . carvedilol  50 mg Oral BID WC  . cloNIDine  0.2 mg Oral BID  . darbepoetin (ARANESP) injection - DIALYSIS  200 mcg Intravenous Q Fri-HD  . [START ON 09/01/2013] doxercalciferol  4 mcg Intravenous Q M,W,F-HD  . feeding supplement (RESOURCE BREEZE)  1 Container Oral BID BM  . hydrALAZINE  100 mg Oral Q8H  . multivitamin  1 tablet Oral QHS  . multivitamin with minerals  1 tablet Oral Daily  . potassium chloride  40 mEq Oral BID    Continuous Infusions: . sodium chloride 10 mL/hr at 08/30/13 [place], RD, LDN, [place] Pager [phone] After Hours Pager [phone]
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
## 2013-08-30 NOTE — Progress Notes (Signed)
Medical Student Daily Progress Note  This is a 43 y.o. year old male on hospital day 3 admitted for hypertensive emergency with chest pain, SOB, headache, also with a history of CHF, CKD stage 5, cirrhosis, HCV, OSA (no CPAP), and substance abuse.  Subjective: RIJ tunneled dialysis catheter placed yesterday. Pt underwent hemodialysis yesterday, during which he received two units pRBCs.   Today, pt complains of pain at his dialysis catheter site relieved with tramadol/oxycodone. He also complains of intermittent palpitations that feel like extra heartbeats. He denies shortness of breath, nausea, diarrhea, constipation, abdominal pain, chest pain, and headache. He attributes his previous headache to the nitroglycerin drip.  Objective: Vital signs in last 24 hours: Filed Vitals:   08/30/13 0200 08/30/13 0334 08/30/13 0500 08/30/13 0701  BP: 173/108 177/111 170/118 169/109  Pulse: 72 70 70 78  Temp:  98.2 F (36.8 C)  98 F (36.7 C)  TempSrc:  Oral  Oral  Resp: 19 12 14 19   Height:      Weight:      SpO2: 98% 99% 96% 99%     Weight change: -3.9 kg (-8 lb 9.6 oz)   Intake/Output Summary (Last 24 hours) at 08/30/13 [id number] Last data filed at 08/30/13 0600  Gross per 24 hour  Intake   1400 ml  Output   3400 ml  Net  -2000 ml    Physical Exam:  GENERAL: reclining in bed in hemodialysis unit, in NAD NEURO: alert and oriented x3.    CHEST: RRR, II/VI systolic murmur. No rubs or gallops.  ABDOMEN: Normoactive bowel sounds. Soft, nontender, nondistended.  EXTREMITIES: AV fistula in R antecubital fossa, previously ligated. No LE edema.   Lab Results: Results for orders placed during the hospital encounter of 08/28/13 (from the past 24 hour(s))  TROPONIN I     Status: None   Collection Time    08/29/13  8:15 AM      Result Value Range   Troponin I <0.30  <0.30 ng/mL  PREPARE RBC (CROSSMATCH)     Status: None   Collection Time    08/29/13 11:42 AM      Result Value Range   Order  Confirmation ORDER PROCESSED BY BLOOD BANK    HEPATITIS B SURFACE ANTIBODY     Status: None   Collection Time    08/29/13  6:00 PM      Result Value Range   Hep B S Ab NEGATIVE  NEGATIVE  HEPATITIS B CORE ANTIBODY, TOTAL     Status: None   Collection Time    08/29/13  6:00 PM      Result Value Range   Hep B Core Total Ab NON REACTIVE  NON REACTIVE  BASIC METABOLIC PANEL     Status: Abnormal   Collection Time    08/29/13  9:47 PM      Result Value Range   Sodium 138  137 - 147 mEq/L   Potassium 3.1 (*) 3.7 - 5.3 mEq/L   Chloride 98  96 - 112 mEq/L   CO2 22  19 - 32 mEq/L   Glucose, Bld 150 (*) 70 - 99 mg/dL   BUN 78 (*) 6 - 23 mg/dL   Creatinine, Ser 11.52 (*) 0.50 - 1.35 mg/dL   Calcium 8.2 (*) 8.4 - 10.5 mg/dL   GFR calc non Af Amer 5 (*) >90 mL/min   GFR calc Af Amer 6 (*) >90 mL/min  HEMOGLOBIN AND HEMATOCRIT, BLOOD     Status:  Abnormal   Collection Time    08/29/13  9:47 PM      Result Value Range   Hemoglobin 8.2 (*) 13.0 - 17.0 g/dL   HCT 24.3 (*) 39.0 - 52.0 %  RENAL FUNCTION PANEL     Status: Abnormal   Collection Time    08/30/13  3:30 AM      Result Value Range   Sodium 138  137 - 147 mEq/L   Potassium 3.2 (*) 3.7 - 5.3 mEq/L   Chloride 98  96 - 112 mEq/L   CO2 21  19 - 32 mEq/L   Glucose, Bld 88  70 - 99 mg/dL   BUN 81 (*) 6 - 23 mg/dL   Creatinine, Ser 11.88 (*) 0.50 - 1.35 mg/dL   Calcium 8.1 (*) 8.4 - 10.5 mg/dL   Phosphorus 5.4 (*) 2.3 - 4.6 mg/dL   Albumin 2.8 (*) 3.5 - 5.2 g/dL   GFR calc non Af Amer 5 (*) >90 mL/min   GFR calc Af Amer 5 (*) >90 mL/min  CBC     Status: Abnormal   Collection Time    08/30/13  5:00 AM      Result Value Range   WBC 6.0  4.0 - 10.5 K/uL   RBC 3.09 (*) 4.22 - 5.81 MIL/uL   Hemoglobin 8.0 (*) 13.0 - 17.0 g/dL   HCT 23.6 (*) 39.0 - 52.0 %   MCV 76.4 (*) 78.0 - 100.0 fL   MCH 25.9 (*) 26.0 - 34.0 pg   MCHC 33.9  30.0 - 36.0 g/dL   RDW 17.8 (*) 11.5 - 15.5 %   Platelets 168  150 - 400 K/uL    Micro  Results: Recent Results (from the past 240 hour(s))  URINE CULTURE     Status: None   Collection Time    08/28/13  2:26 PM      Result Value Range Status   Specimen Description URINE, CLEAN CATCH   Final   Special Requests NONE   Final   Culture  Setup Time     Final   Value: 08/28/2013 15:51     Performed at [facility]     Final   Value: NO GROWTH     Performed at [facility]   Culture     Final   Value: NO GROWTH     Performed at [facility]   Report Status 08/29/2013 FINAL   Final  MRSA PCR SCREENING     Status: None   Collection Time    08/28/13  6:22 PM      Result Value Range Status   MRSA by PCR NEGATIVE  NEGATIVE Final   Comment:            The GeneXpert MRSA Assay (FDA     approved for NASAL specimens     only), is one component of a     comprehensive MRSA colonization     surveillance program. It is not     intended to diagnose MRSA     infection nor to guide or     monitor treatment for     MRSA infections.    Studies/Results: Dg Chest 2 View  08/28/2013   CLINICAL DATA:  Chest pain and shortness of breath.  EXAM: CHEST  2 VIEW  COMPARISON:  PA and lateral chest 04/19/2013.  FINDINGS: There is cardiomegaly with hazy bilateral perihilar opacities. No pneumothorax or pleural fluid.  IMPRESSION: Cardiomegaly with bilateral perihilar opacities which could be due to pulmonary edema or pneumonia.   Electronically Signed   By: Inge Rise M.D.   On: 08/28/2013 13:49   Dg Chest Port 1 View  08/29/2013   CLINICAL DATA:  Dialysis catheter placement.  EXAM: PORTABLE CHEST - 1 VIEW  COMPARISON:  08/28/2013  FINDINGS: Technical malfunction resulted in incomplete imaging of the lung bases.  There has been interval placement of a right jugular dual-lumen central venous catheter with tips overlying the lower SVC. Moderate cardiomegaly remains. The lungs are well inflated. Patchy perihilar opacities have slightly improved from the  prior study. There is no evidence of new airspace consolidation, large pleural effusion, or pneumothorax.  IMPRESSION: 1. Interval right jugular CVC placement as above. 2. Cardiomegaly. 3. Slight interval improvement of perihilar opacities, suggestive of improved, mild edema.   Electronically Signed   By: Logan Bores   On: 08/29/2013 15:00   Dg Fluoro Guide Cv Line-no Report  08/29/2013   CLINICAL DATA: dialysis catheter   FLOURO GUIDE CV LINE  Fluoroscopy was utilized by the requesting physician.  No radiographic  interpretation.     Medications:  Scheduled Meds: . amLODipine  10 mg Oral QHS  . calcitRIOL  0.25 mcg Oral Daily  . carvedilol  50 mg Oral BID WC  . cloNIDine  0.2 mg Oral BID  . darbepoetin (ARANESP) injection - DIALYSIS  200 mcg Intravenous Q Fri-HD  . hydrALAZINE  100 mg Oral Q8H  . multivitamin  1 tablet Oral QHS  . multivitamin with minerals  1 tablet Oral Daily  . [START ON 09/02/2013] Vitamin D (Ergocalciferol)  50,000 Units Oral Q7 days   Continuous Infusions: . sodium chloride 35 mL/hr (08/29/13 1213)   PRN Meds:.sodium chloride, sodium chloride, sodium chloride, sodium chloride, sodium chloride, sodium chloride, alteplase, alteplase, alteplase, feeding supplement (NEPRO CARB STEADY), feeding supplement (NEPRO CARB STEADY), feeding supplement (NEPRO CARB STEADY), heparin, heparin, heparin, heparin, heparin, heparin, lidocaine (PF), lidocaine (PF), lidocaine (PF), lidocaine-prilocaine, lidocaine-prilocaine, lidocaine-prilocaine, nitroGLYCERIN, oxyCODONE pentafluoroprop-tetrafluoroeth, pentafluoroprop-tetrafluoroeth, pentafluoroprop-tetrafluoroeth  Assessment/Plan:  Hypertensive Emergency with CHF exacerbation: BP 220s/150s on admission, 170s/100s now. Systolic and diastolic dysfunction with EF 40-45%, SOB on admission with crackles on exam and likely pulm edema on CXR, improved on most recent CXR. Restarted home po hydralazine, carvedilol, amlodipine, and clonidine.  Symptoms improving quickly; down 3.3L since admission. Per renal, goal for 6L diuresis. - Lasix and NTG gtt's discontinued - amlodipine 10 mg po QHS - carvedilol 25 mg po BID  - clonidine 0.2 mg po BID  - hydralazine 100 mg po TID  - caution with additional volume.   AoCKD stage V: Cr 15.8 on admission, up from 7 in December. Down to 11.88 today. S/p placement of RIJ tunneled dialysis catheter 1/23.  - HD today and likely tomorrow, then likely outpatient HD - plan for L arm access placement Mon/Tues; patient states that he is willing (has previously refused)  - MVI  - calcitriol 0.25 mcg daily  - vitamin D - Intact PTH pending.  Anion gap metabolic acidosis: resolved following HD. Patient persistently uremic, however. - daily BMET   Anemia: Baseline hemoglobin 11.7 in October but has previously hovered around 8. Hgb up to 8.0 today after receiving 2u pRBCs during HD on 1/23. Likely due to underproduction 2/2 low erythropoietin production but possibly some degree of blood loss.  - weekly darbopoetin on Fri's. 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 ALP 35, AST/ALT wnl.  - caution with hepatically metabolized med dosing. Otherwise NTD. - patient needs hepatitis B vaccination given negative Hb S Ab, on chronic hemodialysis, hx substance use - this may need to occur in the outpatient setting. - will attempt to obtain records from previous hospitalizations/nephrology visits in [place].   NOTE: Patient has previously received care at [facility] and [facility] in [place], [place]. We are awaiting records from these hospitals.    LOS: 2 days   This is a Careers information officer Note.  The care of the patient was discussed with Dr. Eula Fried and the assessment and plan formulated with their assistance.  Please see their attached note for official documentation of the daily  encounter.  Charliene Inoue 08/30/2013, 7:23 AM

## 2013-08-30 NOTE — Progress Notes (Signed)
Patient ID: Patrick Anthony, male   DOB: 1971/07/25, 42 y.o.   MRN: [id number] Currently on hemodialysis. Right IJ catheter placed yesterday and functioning well today. Reviewed the patient's evaluation from September of 2014. Patient had right arm AV fistula and off at [place] that failed despite attempted thrombectomy and revision. The patient is left-handed. Vein map in September 2014 adequate upper arm cephalic vein fistula.  Unfortunately the patient has an IV in his antecubital vein into the cephalic vein currently on the left. As a arm protector band on the right with IVs in both arms.  Will DC left arm IVs today. Will "safe" left arm. Will need repeat vein mapping on the left arm to determine if cephalic veins are still patent. We'll do the several days after IVs are removed. Can place a left arm access ring this admission or as an outpatient in the future.

## 2013-08-30 NOTE — Procedures (Signed)
I was present at this session.  I have reviewed the session itself and made appropriate changes.  Bp ^ tol UF, cath function ok.    Najae Filsaime L 1/24/20158:43 AM

## 2013-08-30 NOTE — Progress Notes (Signed)
Internal Medicine Attending  Date: 08/30/2013  Patient name: Patrick Anthony Medical record number: [id number] Date of birth: Sep 30, 1970 Age: 43 y.o. Gender: male  I saw and evaluated the patient on A.M rounds, and discussed his care with housestaff.  I reviewed the resident's note by Dr. Eula Fried and I agree with the resident's findings and plans as documented in her note.

## 2013-08-31 LAB — CBC
HCT: 25.7 % — ABNORMAL LOW (ref 39.0–52.0)
Hemoglobin: 8.5 g/dL — ABNORMAL LOW (ref 13.0–17.0)
MCH: 25.6 pg — ABNORMAL LOW (ref 26.0–34.0)
MCHC: 33.1 g/dL (ref 30.0–36.0)
MCV: 77.4 fL — ABNORMAL LOW (ref 78.0–100.0)
Platelets: 180 10*3/uL (ref 150–400)
RBC: 3.32 MIL/uL — ABNORMAL LOW (ref 4.22–5.81)
RDW: 18 % — ABNORMAL HIGH (ref 11.5–15.5)
WBC: 6.2 10*3/uL (ref 4.0–10.5)

## 2013-08-31 LAB — RENAL FUNCTION PANEL
Albumin: 2.9 g/dL — ABNORMAL LOW (ref 3.5–5.2)
BUN: 67 mg/dL — ABNORMAL HIGH (ref 6–23)
CO2: 21 mEq/L (ref 19–32)
Calcium: 8.3 mg/dL — ABNORMAL LOW (ref 8.4–10.5)
Chloride: 99 mEq/L (ref 96–112)
Creatinine, Ser: 10.19 mg/dL — ABNORMAL HIGH (ref 0.50–1.35)
GFR calc Af Amer: 6 mL/min — ABNORMAL LOW (ref >90)
GFR calc non Af Amer: 6 mL/min — ABNORMAL LOW (ref >90)
Glucose, Bld: 94 mg/dL (ref 70–99)
Phosphorus: 5.2 mg/dL — ABNORMAL HIGH (ref 2.3–4.6)
Potassium: 4 mEq/L (ref 3.7–5.3)
Sodium: 138 mEq/L (ref 137–147)

## 2013-08-31 MED ORDER — POLYETHYLENE GLYCOL 3350 17 G PO PACK
17.0000 g | PACK | Freq: Every day | ORAL | Status: DC
  Administered 2013-08-31: 17 g via ORAL
  Filled 2013-08-31 (×3): qty 1

## 2013-08-31 NOTE — Progress Notes (Signed)
Medical Student Daily Progress Note  This is a 43 y.o. year old male admitted for hypertensive emergency with chest pain, SOB, headache, also with a history of CHF, CKD stage 5, cirrhosis, HCV, OSA (no CPAP), and substance abuse.  Subjective: Patient reports feeling great today with the exception of sinus congestion. Denies sinus pain, headache, chest pain. Has minimal SOB with movement, no SOB at rest.   Objective: Vital signs in last 24 hours: Filed Vitals:   08/31/13 0400 08/31/13 0600 08/31/13 0745 08/31/13 0800  BP: 152/105 136/83  150/101  Pulse: 67 68  68  Temp:   97.6 F (36.4 C) 97.6 F (36.4 C)  TempSrc:   Oral Oral  Resp: 12 14  18   Height:      Weight:      SpO2: 98% 100%  99%     Weight change: 0.2 kg (7.1 oz)   Intake/Output Summary (Last 24 hours) at 08/31/13 1107 Last data filed at 08/31/13 0951  Gross per 24 hour  Intake 967.17 ml  Output    400 ml  Net 567.17 ml    Physical Exam: GENERAL: reclining in bed, in NAD  NEURO: alert and oriented x3.  CHEST: RRR, II/VI systolic murmur. No rubs or gallops.  ABDOMEN: Normoactive bowel sounds. Soft, nontender, nondistended.  EXTREMITIES: AV fistula in R antecubital fossa, previously ligated. No LE edema.   Lab Results: Results for orders placed during the hospital encounter of 08/28/13 (from the past 24 hour(s))  RENAL FUNCTION PANEL     Status: Abnormal   Collection Time    08/31/13  2:53 AM      Result Value Range   Sodium 138  137 - 147 mEq/L   Potassium 4.0  3.7 - 5.3 mEq/L   Chloride 99  96 - 112 mEq/L   CO2 21  19 - 32 mEq/L   Glucose, Bld 94  70 - 99 mg/dL   BUN 67 (*) 6 - 23 mg/dL   Creatinine, Ser 10.19 (*) 0.50 - 1.35 mg/dL   Calcium 8.3 (*) 8.4 - 10.5 mg/dL   Phosphorus 5.2 (*) 2.3 - 4.6 mg/dL   Albumin 2.9 (*) 3.5 - 5.2 g/dL   GFR calc non Af Amer 6 (*) >90 mL/min   GFR calc Af Amer 6 (*) >90 mL/min  CBC     Status: Abnormal   Collection Time    08/31/13  2:55 AM      Result Value  Range   WBC 6.2  4.0 - 10.5 K/uL   RBC 3.32 (*) 4.22 - 5.81 MIL/uL   Hemoglobin 8.5 (*) 13.0 - 17.0 g/dL   HCT 25.7 (*) 39.0 - 52.0 %   MCV 77.4 (*) 78.0 - 100.0 fL   MCH 25.6 (*) 26.0 - 34.0 pg   MCHC 33.1  30.0 - 36.0 g/dL   RDW 18.0 (*) 11.5 - 15.5 %   Platelets 180  150 - 400 K/uL    Micro Results: Recent Results (from the past 240 hour(s))  URINE CULTURE     Status: None   Collection Time    08/28/13  2:26 PM      Result Value Range Status   Specimen Description URINE, CLEAN CATCH   Final   Special Requests NONE   Final   Culture  Setup Time     Final   Value: 08/28/2013 15:51     Performed at [facility]     Final  Value: NO GROWTH     Performed at [facility]     Final   Value: NO GROWTH     Performed at [facility]   Report Status 08/29/2013 FINAL   Final  MRSA PCR SCREENING     Status: None   Collection Time    08/28/13  6:22 PM      Result Value Range Status   MRSA by PCR NEGATIVE  NEGATIVE Final   Comment:            The GeneXpert MRSA Assay (FDA     approved for NASAL specimens     only), is one component of a     comprehensive MRSA colonization     surveillance program. It is not     intended to diagnose MRSA     infection nor to guide or     monitor treatment for     MRSA infections.    Studies/Results: Dg Chest Port 1 View  08/29/2013   CLINICAL DATA:  Dialysis catheter placement.  EXAM: PORTABLE CHEST - 1 VIEW  COMPARISON:  08/28/2013  FINDINGS: Technical malfunction resulted in incomplete imaging of the lung bases.  There has been interval placement of a right jugular dual-lumen central venous catheter with tips overlying the lower SVC. Moderate cardiomegaly remains. The lungs are well inflated. Patchy perihilar opacities have slightly improved from the prior study. There is no evidence of new airspace consolidation, large pleural effusion, or pneumothorax.  IMPRESSION: 1. Interval right jugular CVC  placement as above. 2. Cardiomegaly. 3. Slight interval improvement of perihilar opacities, suggestive of improved, mild edema.   Electronically Signed   By: Logan Bores   On: 08/29/2013 15:00   Dg Fluoro Guide Cv Line-no Report  08/29/2013   CLINICAL DATA: dialysis catheter   FLOURO GUIDE CV LINE  Fluoroscopy was utilized by the requesting physician.  No radiographic  interpretation.     Medications:  Scheduled Meds: . amLODipine  10 mg Oral QHS  . calcium acetate  667 mg Oral TID WC  . carvedilol  50 mg Oral BID WC  . cloNIDine  0.2 mg Oral BID  . darbepoetin (ARANESP) injection - DIALYSIS  200 mcg Intravenous Q Fri-HD  . [START ON 09/01/2013] doxercalciferol  4 mcg Intravenous Q M,W,F-HD  . feeding supplement (RESOURCE BREEZE)  1 Container Oral BID BM  . hydrALAZINE  100 mg Oral Q8H  . multivitamin  1 tablet Oral QHS  . multivitamin with minerals  1 tablet Oral Daily   Continuous Infusions:  PRN Meds:.nitroGLYCERIN, oxyCODONE  Assessment/Plan:  Hypertensive Emergency with CHF exacerbation: BP 220s/150s on admission, 130-150s/80s-100s now. On home meds. Down 6.5 L from admission. - amlodipine 10 mg po QHS  - carvedilol 25 mg po BID  - clonidine 0.2 mg po BID  - hydralazine 100 mg po TID  - caution with additional volume.   AoCKD stage V: Cr 15.8 on admission, up from 7 in December. Down to 10.19 today. S/p placement of RIJ tunneled dialysis catheter 1/23. Vascular surgery did vein mapping but patient had IVs in both arms. Will need to repeat vein mapping in a few days with no IV in L arm.  - HD tomorrow, needs outpt HD arranged. - MVI  - calcium acetate 667 mg TID - doxercalciferol injections with HD - Intact PTH pending.   Anemia: Baseline hemoglobin 11.7 in October but has previously hovered around 8. Hgb up to 8.5 today.  Iron 144, TIBC 272, ferritin 551, but collected post-transfusion. Likely has some degree of baseline iron def given MCV 77. - weekly darbopoetin on Fri's.   - FOBT to assess for GI blood loss   Cirrhosis likely 2/2 HCV: Hepatitis panel otherwise negative; HIV NR. ALP 35, AST/ALT wnl.  - caution with hepatically metabolized med dosing. Otherwise NTD.  - patient needs hepatitis B vaccination given negative Hb S Ab, on chronic hemodialysis, hx substance use - this may need to occur in the outpatient setting.  - records obtained from [facility] not particularly revealing.    LOS: 3 days   This is a Careers information officer Note.  The care of the patient was discussed with Dr. Eula Fried and the assessment and plan formulated with their assistance.  Please see their attached note for official documentation of the daily encounter.  Masami Plata 08/31/2013, 11:07 AM

## 2013-08-31 NOTE — Progress Notes (Signed)
Pt arrived to unit via wheelchair. Pt oriented to unit. Assessment complete. SCDs applied. Pt on room air. Vitals obtained.

## 2013-08-31 NOTE — Progress Notes (Signed)
Subjective: [name] was seen and examined at bedside this morning during HD. He reports feeling great today with no chest pain, shortness of breath, and improved lower extremity swelling.  He has mild congestion this morning.   Objective: Vital signs in last 24 hours: Filed Vitals:   08/31/13 0400 08/31/13 0600 08/31/13 0745 08/31/13 0800  BP: 152/105 136/83  150/101  Pulse: 67 68  68  Temp:   97.6 F (36.4 C) 97.6 F (36.4 C)  TempSrc:   Oral Oral  Resp: 12 14  18   Height:      Weight:      SpO2: 98% 100%  99%   Weight change: 7.1 oz (0.2 kg)  Intake/Output Summary (Last 24 hours) at 08/31/13 1131 Last data filed at 08/31/13 0951  Gross per 24 hour  Intake 727.17 ml  Output    400 ml  Net 327.17 ml   Vitals reviewed. General: resting in bed, NAD HEENT: PERRL, EOMI Cardiac: RRR, +SEM 2/6 left sternal burder  Pulm: clear to auscultation b/l, -wheezing or rales Abd: soft, nontender, nondistended, BS present Ext: warm and well perfused, no edema, +2dp b/l Neuro: alert and oriented X3, cranial nerves II-XII grossly intact, strength equal in bilateral upper and lower extremities  Lab Results: Basic Metabolic Panel:  Recent Labs Lab 08/29/13 0245  08/30/13 0330 08/31/13 0253  NA 142  < > 138 138  K 3.3*  < > 3.2* 4.0  CL 102  < > 98 99  CO2 14*  < > 21 21  GLUCOSE 97  < > 88 94  BUN 118*  < > 81* 67*  CREATININE 15.86*  < > 11.88* 10.19*  CALCIUM 8.1*  < > 8.1* 8.3*  MG 2.0  --   --   --   PHOS 6.7*  --  5.4* 5.2*  < > = values in this interval not displayed. Liver Function Tests:  Recent Labs Lab 08/28/13 1253  08/30/13 0330 08/31/13 0253  AST 18  --   --   --   ALT 16  --   --   --   ALKPHOS 35*  --   --   --   BILITOT 0.3  --   --   --   PROT 7.3  --   --   --   ALBUMIN 3.1*  < > 2.8* 2.9*  < > = values in this interval not displayed. CBC:  Recent Labs Lab 08/28/13 1253  08/30/13 0500 08/31/13 0255  WBC 7.0  < > 6.0 6.2  NEUTROABS 5.5   --   --   --   HGB 6.8*  < > 8.0* 8.5*  HCT 20.4*  < > 23.6* 25.7*  MCV 75.6*  < > 76.4* 77.4*  PLT 205  < > 168 180  < > = values in this interval not displayed. Cardiac Enzymes:  Recent Labs Lab 08/28/13 2107 08/29/13 0245 08/29/13 0815  TROPONINI <0.30 <0.30 <0.30   BNP:  Recent Labs Lab 08/28/13 1253  PROBNP 40974.0*   Hemoglobin A1C:  Recent Labs Lab 08/28/13 2107  HGBA1C 4.6   Fasting Lipid Panel:  Recent Labs Lab 08/28/13 2107  CHOL 165  HDL 32*  LDLCALC 108*  TRIG 126  CHOLHDL 5.2   Thyroid Function Tests:  Recent Labs Lab 08/28/13 2107  TSH 2.006   Coagulation:  Recent Labs Lab 08/29/13 0245  LABPROT 15.0  INR 1.21   Urine Drug Screen: Drugs of Abuse  Component Value Date/Time   LABOPIA NONE DETECTED 08/29/2013 0106   COCAINSCRNUR NONE DETECTED 08/29/2013 0106   LABBENZ NONE DETECTED 08/29/2013 0106   AMPHETMU NONE DETECTED 08/29/2013 0106   THCU NONE DETECTED 08/29/2013 0106   LABBARB NONE DETECTED 08/29/2013 0106    Urinalysis:  Recent Labs Lab 08/28/13 1426  COLORURINE YELLOW  LABSPEC 1.017  PHURINE 6.0  GLUCOSEU 100*  HGBUR SMALL*  BILIRUBINUR NEGATIVE  KETONESUR NEGATIVE  PROTEINUR >300*  UROBILINOGEN 0.2  NITRITE NEGATIVE  LEUKOCYTESUR SMALL*   Micro Results: Recent Results (from the past 240 hour(s))  URINE CULTURE     Status: None   Collection Time    08/28/13  2:26 PM      Result Value Range Status   Specimen Description URINE, CLEAN CATCH   Final   Special Requests NONE   Final   Culture  Setup Time     Final   Value: 08/28/2013 15:51     Performed at [facility]     Final   Value: NO GROWTH     Performed at [facility]   Culture     Final   Value: NO GROWTH     Performed at [facility]   Report Status 08/29/2013 FINAL   Final  MRSA PCR SCREENING     Status: None   Collection Time    08/28/13  6:22 PM      Result Value Range Status   MRSA by PCR NEGATIVE   NEGATIVE Final   Comment:            The GeneXpert MRSA Assay (FDA     approved for NASAL specimens     only), is one component of a     comprehensive MRSA colonization     surveillance program. It is not     intended to diagnose MRSA     infection nor to guide or     monitor treatment for     MRSA infections.   Studies/Results: Dg Chest Port 1 View  08/29/2013   CLINICAL DATA:  Dialysis catheter placement.  EXAM: PORTABLE CHEST - 1 VIEW  COMPARISON:  08/28/2013  FINDINGS: Technical malfunction resulted in incomplete imaging of the lung bases.  There has been interval placement of a right jugular dual-lumen central venous catheter with tips overlying the lower SVC. Moderate cardiomegaly remains. The lungs are well inflated. Patchy perihilar opacities have slightly improved from the prior study. There is no evidence of new airspace consolidation, large pleural effusion, or pneumothorax.  IMPRESSION: 1. Interval right jugular CVC placement as above. 2. Cardiomegaly. 3. Slight interval improvement of perihilar opacities, suggestive of improved, mild edema.   Electronically Signed   By: Logan Bores   On: 08/29/2013 15:00   Dg Fluoro Guide Cv Line-no Report  08/29/2013   CLINICAL DATA: dialysis catheter   FLOURO GUIDE CV LINE  Fluoroscopy was utilized by the requesting physician.  No radiographic  interpretation.    Medications: I have reviewed the patient's current medications. Scheduled Meds: . amLODipine  10 mg Oral QHS  . calcium acetate  667 mg Oral TID WC  . carvedilol  50 mg Oral BID WC  . cloNIDine  0.2 mg Oral BID  . darbepoetin (ARANESP) injection - DIALYSIS  200 mcg Intravenous Q Fri-HD  . [START ON 09/01/2013] doxercalciferol  4 mcg Intravenous Q M,W,F-HD  . feeding supplement (RESOURCE BREEZE)  1 Container Oral BID BM  .  hydrALAZINE  100 mg Oral Q8H  . multivitamin  1 tablet Oral QHS  . multivitamin with minerals  1 tablet Oral Daily   Continuous Infusions:   PRN  Meds:.nitroGLYCERIN, oxyCODONE Assessment/Plan: Principal Problem:   Hypertensive emergency Active Problems:   Systolic and diastolic CHF, acute on chronic   CKD (chronic kidney disease), stage V   Headache(784.0)   Chest pain   Anemia in chronic kidney disease   Hepatitis C   ESRD on dialysis   Fluid overload Patrick Anthony is a 43 year old male with uncontrolled HTN, CKD stage 5, and combined CHF admitted for hypertensive emergency.   Hypertensive emergency--resolved, BP much improved today.  Secondary to medication non-compliance in setting of ESRD with combined systolic and diastolic CHF. Now on HD.  -has had 2 sessions of HD since admission, HD again tomorrow. Needs to have outpatient HD set up.  -continue norvasc 10mg , coreg 50mg  bid, clonidine 0.2mg  qd, hydralazine 100mg  q8h; hopefully will be able to simplify regimen as HD continues. -transfer out of step down today  ESRD on HD with uremia but non-oliguric. S/P tunneled HD cath 1/23.  Needs AVF placement but need to protect left arm and repeat vein mapping.    Recent Labs Lab 08/28/13 1253 08/29/13 0245 08/29/13 2147 08/30/13 0330 08/31/13 0253  CREATININE 15.60* 15.86* 11.52* 11.88* 10.19*   -HD started 1/23 -Trend renal function  -Appreciate renal following  Anion gap metabolic acidosis in setting of uremia with ESRD-AG 27 on admission, down to 18 today. Cr 15.6 on admission, improved to 10.19 today.  -continue HD per renal  Hepatitis C--Reactive Ab.  HIV negative. Need to obtain records from [facility]  Anemia--likely setting to chronic disease with ESRD. Hb 6.8 on admission, was 11.7 in October 2014. S/p 2 units PRBC 1/23.  -Hb stable in 8's -fobt  Acute on chronic diastolic and systolic heart failure--EF 40-45% with diffuse hypokinesis and grade 2 DD per echo 04/2013. SOB and chest pain resolved. Improving with HD and improved BP control.  -back on home BP medications -HD and continue po BP  medications  Diet: Renal DVT Ppx: Heparin with HD, SCDs Dispo: Disposition is deferred at this time, awaiting improvement of current medical problems.  Anticipated discharge in approximately 2-3 day(s).    The patient does not have a current PCP (No Pcp Per Patient) and does need an [facility] hospital follow-up appointment after discharge.  The patient does not have transportation limitations that hinder transportation to clinic appointments.  Services Needed at time of discharge: Y = Yes, Blank = No PT:   OT:   RN:   Equipment:   Other:     LOS: 3 days   Jerene Pitch, MD 08/31/2013, 11:31 AM

## 2013-08-31 NOTE — Progress Notes (Signed)
  I have seen and examined the patient, and reviewed the daily progress note by Corey Skains, MS 4 and discussed the care of the patient with them. Please see my progress note from 08/31/2013 for further details regarding assessment and plan.    Signed:  Jerene Pitch, MD 08/31/2013, 4:15 PM

## 2013-08-31 NOTE — Progress Notes (Signed)
Internal Medicine Attending  Date: 08/31/2013  Patient name: Patrick Anthony Medical record number: [id number] Date of birth: 30-May-1971 Age: 43 y.o. Gender: male  I saw and evaluated the patient on A.M rounds, and discussed his care with housestaff.  I reviewed the resident's note by Dr. Eula Fried and I agree with the resident's findings and plans as documented in her note.

## 2013-08-31 NOTE — Progress Notes (Signed)
Report called pt transferring to 6E04 via w/c with belongings.
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
## 2013-09-01 DIAGNOSIS — N186 End stage renal disease: Secondary | ICD-10-CM

## 2013-09-01 LAB — CBC
HCT: 25.7 % — ABNORMAL LOW (ref 39.0–52.0)
Hemoglobin: 8.5 g/dL — ABNORMAL LOW (ref 13.0–17.0)
MCH: 26 pg (ref 26.0–34.0)
MCHC: 33.1 g/dL (ref 30.0–36.0)
MCV: 78.6 fL (ref 78.0–100.0)
Platelets: 192 10*3/uL (ref 150–400)
RBC: 3.27 MIL/uL — ABNORMAL LOW (ref 4.22–5.81)
RDW: 18.2 % — ABNORMAL HIGH (ref 11.5–15.5)
WBC: 6 10*3/uL (ref 4.0–10.5)

## 2013-09-01 LAB — RENAL FUNCTION PANEL
Albumin: 2.8 g/dL — ABNORMAL LOW (ref 3.5–5.2)
BUN: 81 mg/dL — ABNORMAL HIGH (ref 6–23)
CO2: 20 mEq/L (ref 19–32)
Calcium: 8.4 mg/dL (ref 8.4–10.5)
Chloride: 95 mEq/L — ABNORMAL LOW (ref 96–112)
Creatinine, Ser: 12.05 mg/dL — ABNORMAL HIGH (ref 0.50–1.35)
GFR calc Af Amer: 5 mL/min — ABNORMAL LOW (ref >90)
GFR calc non Af Amer: 4 mL/min — ABNORMAL LOW (ref >90)
Glucose, Bld: 121 mg/dL — ABNORMAL HIGH (ref 70–99)
Phosphorus: 5.9 mg/dL — ABNORMAL HIGH (ref 2.3–4.6)
Potassium: 3.7 mEq/L (ref 3.7–5.3)
Sodium: 133 mEq/L — ABNORMAL LOW (ref 137–147)

## 2013-09-01 LAB — PARATHYROID HORMONE, INTACT (NO CA): PTH: 269.1 pg/mL — ABNORMAL HIGH (ref 14.0–72.0)

## 2013-09-01 MED ORDER — ALTEPLASE 2 MG IJ SOLR
2.0000 mg | Freq: Once | INTRAMUSCULAR | Status: DC | PRN
Start: 2013-09-01 — End: 2013-09-01
  Filled 2013-09-01: qty 2

## 2013-09-01 MED ORDER — HEPARIN SODIUM (PORCINE) 1000 UNIT/ML DIALYSIS
1000.0000 [IU] | INTRAMUSCULAR | Status: DC | PRN
  Filled 2013-09-01: qty 1

## 2013-09-01 MED ORDER — LIDOCAINE-PRILOCAINE 2.5-2.5 % EX CREA: 1.0000 "application " | TOPICAL_CREAM | CUTANEOUS | Status: DC | PRN

## 2013-09-01 MED ORDER — LIDOCAINE HCL (PF) 1 % IJ SOLN: 5.0000 mL | INTRAMUSCULAR | Status: DC | PRN

## 2013-09-01 MED ORDER — HEPARIN SODIUM (PORCINE) 1000 UNIT/ML DIALYSIS
100.0000 [IU]/kg | INTRAMUSCULAR | Status: DC | PRN
  Administered 2013-09-01: 7300 [IU] via INTRAVENOUS_CENTRAL

## 2013-09-01 MED ORDER — SODIUM CHLORIDE 0.9 % IV SOLN: 100.0000 mL | INTRAVENOUS | Status: DC | PRN

## 2013-09-01 MED ORDER — NEPRO/CARBSTEADY PO LIQD: 237.0000 mL | ORAL | Status: DC | PRN

## 2013-09-01 MED ORDER — PENTAFLUOROPROP-TETRAFLUOROETH EX AERO: 1.0000 "application " | INHALATION_SPRAY | CUTANEOUS | Status: DC | PRN

## 2013-09-01 NOTE — Care Management Note (Signed)
   CARE MANAGEMENT NOTE 09/01/2013  Patient:  Patrick Anthony, Patrick Anthony   Account Number:  [id number]  Date Initiated:  09/01/2013  Documentation initiated by:  Jasmine Pang  Subjective/Objective Assessment:   Noted CM consult for medicaiton assistance     Action/Plan:   Pt has Medicaid of [place] for medication benefits, therefore does not qualify for further assistance.   Anticipated DC Date:  09/02/2013   Anticipated DC Plan:  HOME/SELF CARE         Choice offered to / List presented to:             Status of service:  Completed, signed off Medicare Important Message given?   (If response is "NO", the following Medicare IM given date fields will be blank) Date Medicare IM given:   Date Additional Medicare IM given:    Discharge Disposition:    Per UR Regulation:    If discussed at Long Length of Stay Meetings, dates discussed:    Comments:

## 2013-09-01 NOTE — Progress Notes (Signed)
  I have seen and examined the patient, and reviewed the daily progress note by Corey Skains, MS 4 and discussed the care of the patient with them. Please see my progress note from 09/01/2013 for further details regarding assessment and plan.    Signed:  Jerene Pitch, MD 09/01/2013, 9:28 PM

## 2013-09-01 NOTE — Progress Notes (Signed)
Subjective: Patrick Anthony was seen and examined at bedside this morning during HD. He reports pain at site of HD cath.   Objective: Vital signs in last 24 hours: Filed Vitals:   09/01/13 1530 09/01/13 1600 09/01/13 1605 09/01/13 1759  BP: 121/87 140/90 138/92 146/67  Pulse: 67 68 66 67  Temp:   98.4 F (36.9 C) 98.1 F (36.7 C)  TempSrc:   Oral Oral  Resp: 11 9 14 16   Height:      Weight:   158 lb 8.2 oz (71.9 kg)   SpO2:   100% 100%   Weight change:   Intake/Output Summary (Last 24 hours) at 09/01/13 2029 Last data filed at 09/01/13 1940  Gross per 24 hour  Intake      0 ml  Output   3125 ml  Net  -3125 ml   Vitals reviewed. General: resting in bed, NAD HEENT: PERRL, EOMI Cardiac: RRR, +SEM 2/6 left sternal burder  Pulm: clear to auscultation b/l, -wheezing or rales Abd: soft, nontender, BS present Ext: warm and well perfused, no edema, +2dp b/l Neuro: alert and oriented X3, cranial nerves II-XII grossly intact, strength and sensation equal in bilateral upper and lower extremities  Lab Results: Basic Metabolic Panel:  Recent Labs Lab 08/29/13 0245  08/31/13 0253 09/01/13 0500  NA 142  < > 138 133*  K 3.3*  < > 4.0 3.7  CL 102  < > 99 95*  CO2 14*  < > 21 20  GLUCOSE 97  < > 94 121*  BUN 118*  < > 67* 81*  CREATININE 15.86*  < > 10.19* 12.05*  CALCIUM 8.1*  < > 8.3* 8.4  MG 2.0  --   --   --   PHOS 6.7*  < > 5.2* 5.9*  < > = values in this interval not displayed. Liver Function Tests:  Recent Labs Lab 08/28/13 1253  08/31/13 0253 09/01/13 0500  AST 18  --   --   --   ALT 16  --   --   --   ALKPHOS 35*  --   --   --   BILITOT 0.3  --   --   --   PROT 7.3  --   --   --   ALBUMIN 3.1*  < > 2.9* 2.8*  < > = values in this interval not displayed. CBC:  Recent Labs Lab 08/28/13 1253  08/31/13 0255 09/01/13 0500  WBC 7.0  < > 6.2 6.0  NEUTROABS 5.5  --   --   --   HGB 6.8*  < > 8.5* 8.5*  HCT 20.4*  < > 25.7* 25.7*  MCV 75.6*  < > 77.4* 78.6    PLT 205  < > 180 192  < > = values in this interval not displayed. Cardiac Enzymes:  Recent Labs Lab 08/28/13 2107 08/29/13 0245 08/29/13 0815  TROPONINI <0.30 <0.30 <0.30   BNP:  Recent Labs Lab 08/28/13 1253  PROBNP 40974.0*   Hemoglobin A1C:  Recent Labs Lab 08/28/13 2107  HGBA1C 4.6   Fasting Lipid Panel:  Recent Labs Lab 08/28/13 2107  CHOL 165  HDL 32*  LDLCALC 108*  TRIG 126  CHOLHDL 5.2   Thyroid Function Tests:  Recent Labs Lab 08/28/13 2107  TSH 2.006   Coagulation:  Recent Labs Lab 08/29/13 0245  LABPROT 15.0  INR 1.21   Urine Drug Screen: Drugs of Abuse     Component Value Date/Time  LABOPIA NONE DETECTED 08/29/2013 0106   COCAINSCRNUR NONE DETECTED 08/29/2013 0106   LABBENZ NONE DETECTED 08/29/2013 0106   AMPHETMU NONE DETECTED 08/29/2013 0106   THCU NONE DETECTED 08/29/2013 0106   LABBARB NONE DETECTED 08/29/2013 0106    Urinalysis:  Recent Labs Lab 08/28/13 1426  COLORURINE YELLOW  LABSPEC 1.017  PHURINE 6.0  GLUCOSEU 100*  HGBUR SMALL*  BILIRUBINUR NEGATIVE  KETONESUR NEGATIVE  PROTEINUR >300*  UROBILINOGEN 0.2  NITRITE NEGATIVE  LEUKOCYTESUR SMALL*   Micro Results: Recent Results (from the past 240 hour(s))  URINE CULTURE     Status: None   Collection Time    08/28/13  2:26 PM      Result Value Range Status   Specimen Description URINE, CLEAN CATCH   Final   Special Requests NONE   Final   Culture  Setup Time     Final   Value: 08/28/2013 15:51     Performed at [place]     Final   Value: NO GROWTH     Performed at [facility]   Culture     Final   Value: NO GROWTH     Performed at [facility]   Report Status 08/29/2013 FINAL   Final  MRSA PCR SCREENING     Status: None   Collection Time    08/28/13  6:22 PM      Result Value Range Status   MRSA by PCR NEGATIVE  NEGATIVE Final   Comment:            The GeneXpert MRSA Assay (FDA     approved for NASAL  specimens     only), is one component of a     comprehensive MRSA colonization     surveillance program. It is not     intended to diagnose MRSA     infection nor to guide or     monitor treatment for     MRSA infections.   Medications: I have reviewed the patient's current medications. Scheduled Meds: . amLODipine  10 mg Oral QHS  . calcium acetate  667 mg Oral TID WC  . carvedilol  50 mg Oral BID WC  . cloNIDine  0.2 mg Oral BID  . darbepoetin (ARANESP) injection - DIALYSIS  200 mcg Intravenous Q Fri-HD  . doxercalciferol  4 mcg Intravenous Q M,W,F-HD  . feeding supplement (RESOURCE BREEZE)  1 Container Oral BID BM  . hydrALAZINE  100 mg Oral Q8H  . multivitamin  1 tablet Oral QHS  . multivitamin with minerals  1 tablet Oral Daily  . polyethylene glycol  17 g Oral Daily   Continuous Infusions:   PRN Meds:.nitroGLYCERIN, oxyCODONE Assessment/Plan: Principal Problem:   Hypertensive emergency Active Problems:   Systolic and diastolic CHF, acute on chronic   CKD (chronic kidney disease), stage V   Headache(784.0)   Chest pain   Anemia in chronic kidney disease   Hepatitis C   ESRD on dialysis   Fluid overload Patrick Anthony is a 43 year old male with uncontrolled HTN, CKD stage 5, and combined CHF admitted for hypertensive emergency.   Hypertension--improved with HD and on coreg, norvasc, clonidine, and hydralazine  ESRD on HD. S/P tunneled HD cath 1/23.  AVF placement wed.    Recent Labs Lab 08/29/13 0245 08/29/13 2147 08/30/13 0330 08/31/13 0253 09/01/13 0500  CREATININE 15.86* 11.52* 11.88* 10.19* 12.05*   -HD started 1/23, HD today will be MWF, pending  outpatient placement -Trend renal function  -Appreciate renal following  Anion gap metabolic acidosis in setting of ESRD-AG 27 on admission, down to 18 today.  -continue HD per renal  Hepatitis C--Reactive Ab.  HIV negative.  -will obtain records  Anemia--likely setting to chronic disease with ESRD. Hb 6.8  on admission, was 11.7 in October 2014. S/p 2 units PRBC 1/23.  -Hb stable in 8's  Acute on chronic diastolic and systolic heart failure--EF 40-45% with diffuse hypokinesis and grade 2 DD per echo 04/2013. SOB and chest pain resolved. Improving with HD and improved BP control.  -HD and continue po BP medications  Diet: Renal DVT Ppx: Heparin with HD, SCDs Dispo: Disposition is deferred at this time, awaiting improvement of current medical problems.  Anticipated discharge in approximately 2-3 day(s).    The patient does not have a current PCP (No Pcp Per Patient) and does need an [facility] hospital follow-up appointment after discharge.  The patient does not have transportation limitations that hinder transportation to clinic appointments.  Services Needed at time of discharge: Y = Yes, Blank = No PT:   OT:   RN:   Equipment:   Other:     LOS: 4 days   Jerene Pitch, MD 09/01/2013, 8:29 PM
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
## 2013-09-01 NOTE — Progress Notes (Signed)
Internal Medicine Attending  Date: 09/01/2013  Patient name: Patrick Anthony Medical record number: [id number] Date of birth: [birth year] Age: 43 y.o. Gender: male  I saw and evaluated the patient, and discussed his care on A.M rounds with housestaff.  He is without complaint and doing well on current antihypertensive regimen.  Plan is for hemodialysis as per nephrology; vascular access placement as per vascular surgery.

## 2013-09-01 NOTE — Progress Notes (Signed)
Medical Student Daily Progress Note  This is a 43 y.o. year old male admitted for hypertensive emergency with chest pain, SOB, headache, also with a history of CHF, CKD stage 5, cirrhosis, HCV, OSA (no CPAP), and substance abuse.  Subjective: Doing well today; receiving dialysis today. C/o sinus congestion, scratchy voice, feeling jittery,  but no headache, chest pain, SOB, abd pain, nausea. Some persistent pain at dialysis catheter site relieved with oxycodone, and somewhat improved from a few days ago.  Objective: Vital signs in last 24 hours: Filed Vitals:   08/31/13 2158 09/01/13 0500 09/01/13 0809 09/01/13 1230  BP: 150/93 140/98 123/82 135/87  Pulse: 74 71 81 73  Temp:  97.8 F (36.6 C) 98 F (36.7 C) 97.9 F (36.6 C)  TempSrc:  Oral Oral Oral  Resp:  18 18 18   Height:      Weight:      SpO2:  100% 100% 100%   Filed Weights   08/30/13 0710 08/30/13 1030 08/31/13 0334  Weight: 74.8 kg (164 lb 14.5 oz) 72.8 kg (160 lb 7.9 oz) 75.1 kg (165 lb 9.1 oz)     Intake/Output Summary (Last 24 hours) at 09/01/13 1319 Last data filed at 09/01/13 0500  Gross per 24 hour  Intake    120 ml  Output    475 ml  Net   -355 ml    Physical Exam: GENERAL: sitting up eating breakfast, in NAD  NEURO: alert and oriented x3.  CHEST: RRR, II/VI systolic murmur. No rubs or gallops.  ABDOMEN: Normoactive bowel sounds. Soft, nontender, nondistended.  EXTREMITIES: No LE edema.   Lab Results: No results found for this or any previous visit (from the past 24 hour(s)).  Micro Results: Recent Results (from the past 240 hour(s))  URINE CULTURE     Status: None   Collection Time    08/28/13  2:26 PM      Result Value Range Status   Specimen Description URINE, CLEAN CATCH   Final   Special Requests NONE   Final   Culture  Setup Time     Final   Value: 08/28/2013 15:51     Performed at [place]     Final   Value: NO GROWTH     Performed at [facility]   Culture     Final   Value: NO GROWTH     Performed at [facility]   Report Status 08/29/2013 FINAL   Final  MRSA PCR SCREENING     Status: None   Collection Time    08/28/13  6:22 PM      Result Value Range Status   MRSA by PCR NEGATIVE  NEGATIVE Final   Comment:            The GeneXpert MRSA Assay (FDA     approved for NASAL specimens     only), is one component of a     comprehensive MRSA colonization     surveillance program. It is not     intended to diagnose MRSA     infection nor to guide or     monitor treatment for     MRSA infections.    Studies/Results: No results found.  Medications:  Scheduled Meds: . amLODipine  10 mg Oral QHS  . calcium acetate  667 mg Oral TID WC  . carvedilol  50 mg Oral BID WC  . cloNIDine  0.2 mg Oral BID  .  darbepoetin (ARANESP) injection - DIALYSIS  200 mcg Intravenous Q Fri-HD  . doxercalciferol  4 mcg Intravenous Q M,W,F-HD  . feeding supplement (RESOURCE BREEZE)  1 Container Oral BID BM  . hydrALAZINE  100 mg Oral Q8H  . multivitamin  1 tablet Oral QHS  . multivitamin with minerals  1 tablet Oral Daily  . polyethylene glycol  17 g Oral Daily   Continuous Infusions:  PRN Meds:.sodium chloride, sodium chloride, alteplase, feeding supplement (NEPRO CARB STEADY), heparin, heparin, lidocaine (PF), lidocaine-prilocaine, nitroGLYCERIN, oxyCODONE, pentafluoroprop-tetrafluoroeth  Assessment/Plan:  Hypertensive Emergency with CHF exacerbation: BP 120-150s/80s-90s now. On home meds. Down 6.7 L from admission.  - amlodipine 10 mg po QHS  - carvedilol 25 mg po BID  - clonidine 0.2 mg po BID  - hydralazine 100 mg po TID  - if BP becomes elevated again, will consider increasing clonidine to TID.  AoCKD stage V: No BMET drawn today. S/p placement of RIJ tunneled dialysis catheter 1/23. Repeat vein mapping ordered; plan for L arm BC AVF vs staged BVT placement on Wednesday per Dr. Bridgett Larsson (vasc surgery). - HD today, waiting on  arrangements for outpt HD.  - MVI  - calcium acetate 667 mg TID  - doxercalciferol injections with HD  - Intact PTH pending.  - appreciate nephrology recommendations and management of HD.  Anemia: Baseline hemoglobin 11.7 in October but has previously hovered around 8. Hgb up to 8.5 today. Iron 144, TIBC 272, ferritin 551, but collected post-transfusion. Likely has some degree of baseline iron def given MCV 77.  - weekly darbopoetin on Fri's.   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 ALP 35, AST/ALT wnl.  - caution with hepatically metabolized med dosing. Otherwise NTD.  - patient needs hepatitis B vaccination given negative Hb S Ab, on chronic hemodialysis, hx substance use - this may need to occur in the outpatient setting.  - records obtained from [facility] not particularly revealing.    LOS: 4 days   This is a Careers information officer Note.  The care of the patient was discussed with Dr. Eula Fried and the assessment and plan formulated with their assistance.  Please see their attached note for official documentation of the daily encounter.  Ruweyda Macknight 09/01/2013, 1:19 PM

## 2013-09-01 NOTE — Procedures (Signed)
I have personally attended this patient's dialysis session.  Dialysis via tunnelled dialysis catheter.  Labs pending.    Patrick Anthony B

## 2013-09-01 NOTE — Progress Notes (Signed)
   Daily Progress Note  Assessment/Planning: POD #3 s/p TDC placement for ESRD   L vein mapping ordered by Dr. Donnetta Hutching to see if L cephalic vein thrombus resolved  L arm BC AVF vs staged BVT placement on Wednesday as long as medically stable  Subjective  - 3 Days Post-Op  No complaints  Objective Filed Vitals:   08/31/13 2037 08/31/13 2158 09/01/13 0500 09/01/13 0809  BP: 135/92 150/93 140/98 123/82  Pulse: 74 74 71 81  Temp: 97.5 F (36.4 C)  97.8 F (36.6 C) 98 F (36.7 C)  TempSrc: Oral  Oral Oral  Resp: 20  18 18   Height:      Weight:      SpO2: 100%  100% 100%    Intake/Output Summary (Last 24 hours) at 09/01/13 0949 Last data filed at 09/01/13 0500  Gross per 24 hour  Intake    300 ml  Output    475 ml  Net   -175 ml    PULM  CTAB CV  RRR GI  soft, NTND VASC  L AC cephalic vein appears compressible , IV out  Laboratory CBC    Component Value Date/Time   WBC 6.2 08/31/2013 0255   HGB 8.5* 08/31/2013 0255   HCT 25.7* 08/31/2013 0255   PLT 180 08/31/2013 0255    BMET    Component Value Date/Time   NA 138 08/31/2013 0253   K 4.0 08/31/2013 0253   CL 99 08/31/2013 0253   CO2 21 08/31/2013 0253   GLUCOSE 94 08/31/2013 0253   BUN 67* 08/31/2013 0253   CREATININE 10.19* 08/31/2013 0253   CALCIUM 8.3* 08/31/2013 0253   GFRNONAA 6* 08/31/2013 0253   GFRAA 6* 08/31/2013 0253    Adele Barthel, MD [facility]: [phone] Pager: [phone]  09/01/2013, 9:49 AM

## 2013-09-01 NOTE — Care Management Note (Signed)
Noted CM referral for Medication assistance, however pt has Medicaid of [place], therefore has a minimal copay for meds and is not eligible for assistance.  Jasmine Pang  RN MPH, case manager, [phone]

## 2013-09-02 ENCOUNTER — Encounter (HOSPITAL_COMMUNITY): Payer: Self-pay | Admitting: Vascular Surgery

## 2013-09-02 DIAGNOSIS — Z01810 Encounter for preprocedural cardiovascular examination: Secondary | ICD-10-CM

## 2013-09-02 LAB — RENAL FUNCTION PANEL
Albumin: 2.9 g/dL — ABNORMAL LOW (ref 3.5–5.2)
BUN: 60 mg/dL — ABNORMAL HIGH (ref 6–23)
CO2: 23 mEq/L (ref 19–32)
Calcium: 8.4 mg/dL (ref 8.4–10.5)
Chloride: 97 mEq/L (ref 96–112)
Creatinine, Ser: 9.52 mg/dL — ABNORMAL HIGH (ref 0.50–1.35)
GFR calc Af Amer: 7 mL/min — ABNORMAL LOW (ref >90)
GFR calc non Af Amer: 6 mL/min — ABNORMAL LOW (ref >90)
Glucose, Bld: 96 mg/dL (ref 70–99)
Phosphorus: 6.3 mg/dL — ABNORMAL HIGH (ref 2.3–4.6)
Potassium: 4.1 mEq/L (ref 3.7–5.3)
Sodium: 137 mEq/L (ref 137–147)

## 2013-09-02 MED ORDER — POLYETHYLENE GLYCOL 3350 17 G PO PACK
17.0000 g | PACK | Freq: Two times a day (BID) | ORAL | Status: DC
  Filled 2013-09-02 (×6): qty 1

## 2013-09-02 MED ORDER — OXYCODONE HCL 5 MG PO TABS
5.0000 mg | ORAL_TABLET | Freq: Two times a day (BID) | ORAL | Status: DC | PRN
  Administered 2013-09-02: 5 mg via ORAL
  Filled 2013-09-02: qty 1

## 2013-09-02 MED ORDER — OXYCODONE HCL 5 MG PO TABS
5.0000 mg | ORAL_TABLET | Freq: Every day | ORAL | Status: DC | PRN
  Administered 2013-09-02: 5 mg via ORAL
  Filled 2013-09-02 (×2): qty 1

## 2013-09-02 MED ORDER — DARBEPOETIN ALFA-POLYSORBATE 200 MCG/0.4ML IJ SOLN
200.0000 ug | INTRAMUSCULAR | Status: DC
  Administered 2013-09-03: 200 ug via INTRAVENOUS
  Filled 2013-09-02: qty 0.4

## 2013-09-02 MED ORDER — CALCIUM ACETATE 667 MG PO CAPS
1334.0000 mg | ORAL_CAPSULE | Freq: Three times a day (TID) | ORAL | Status: DC
  Administered 2013-09-02 (×3): 1334 mg via ORAL
  Filled 2013-09-02 (×7): qty 2
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
## 2013-09-02 NOTE — Progress Notes (Signed)
Left Upper Extremity Vein Map    Cephalic  Segment Diameter Depth Comment  1. Axilla 4.86mm mm   2. Mid upper arm 4.39mm mm   3. Above AC 3.3mm mm   4. In AC 5.68mm mm   5. Below AC 5.39mm mm branch  6. Mid forearm 3.2mm mm branch  7. Wrist 3.72mm mm branch   mm mm    mm mm    mm mm    Landry Mellow, RDMS, RVT 09/02/2013

## 2013-09-02 NOTE — Progress Notes (Signed)
09/02/2013 2:34 PM Hemodialysis Outpatient Note; this patient has been accepted at the [facility]. Per the medical director once the patient has obtained a permanent access. I will update with a schedule when one is provided. Thank you.Gordy Savers

## 2013-09-02 NOTE — Progress Notes (Signed)
Subjective: Patrick Anthony was seen and examined at bedside this morning.  He tolerated HD well again yesterday, is ready for his procedure tomorrow but is requesting reading material.  He reports improved pain at site of HD catheter.   Objective: Vital signs in last 24 hours: Filed Vitals:   09/01/13 1759 09/01/13 1900 09/01/13 2246 09/02/13 0540  BP: 146/67 144/93 132/92 136/88  Pulse: 67 80  71  Temp: 98.1 F (36.7 C) 98.7 F (37.1 C)  98.4 F (36.9 C)  TempSrc: Oral Oral  Oral  Resp: 16 16  16   Height:      Weight:  157 lb 8 oz (71.442 kg)    SpO2: 100% 100%  97%   Weight change:   Intake/Output Summary (Last 24 hours) at 09/02/13 0916 Last data filed at 09/02/13 0543  Gross per 24 hour  Intake      0 ml  Output   3100 ml  Net  -3100 ml   Vitals reviewed. General: resting in bed, NAD HEENT: PERRL, EOMI  Cardiac: RRR, +SEM 2/6 left sternal burder  Pulm: clear to auscultation b/l, -wheezing or rales Abd: soft, nontender, BS present Ext: warm and well perfused, no edema, moving all 4 extremities Neuro: alert and oriented X3, cranial nerves II-XII grossly intact, strength equal in bilateral upper and lower extremities  Lab Results: Basic Metabolic Panel:  Recent Labs Lab 08/29/13 0245  09/01/13 0500 09/02/13 0620  NA 142  < > 133* 137  K 3.3*  < > 3.7 4.1  CL 102  < > 95* 97  CO2 14*  < > 20 23  GLUCOSE 97  < > 121* 96  BUN 118*  < > 81* 60*  CREATININE 15.86*  < > 12.05* 9.52*  CALCIUM 8.1*  < > 8.4 8.4  MG 2.0  --   --   --   PHOS 6.7*  < > 5.9* 6.3*  < > = values in this interval not displayed. Liver Function Tests:  Recent Labs Lab 08/28/13 1253  09/01/13 0500 09/02/13 0620  AST 18  --   --   --   ALT 16  --   --   --   ALKPHOS 35*  --   --   --   BILITOT 0.3  --   --   --   PROT 7.3  --   --   --   ALBUMIN 3.1*  < > 2.8* 2.9*  < > = values in this interval not displayed. CBC:  Recent Labs Lab 08/28/13 1253  08/31/13 0255 09/01/13 0500    WBC 7.0  < > 6.2 6.0  NEUTROABS 5.5  --   --   --   HGB 6.8*  < > 8.5* 8.5*  HCT 20.4*  < > 25.7* 25.7*  MCV 75.6*  < > 77.4* 78.6  PLT 205  < > 180 192  < > = values in this interval not displayed. Cardiac Enzymes:  Recent Labs Lab 08/28/13 2107 08/29/13 0245 08/29/13 0815  TROPONINI <0.30 <0.30 <0.30   BNP:  Recent Labs Lab 08/28/13 1253  PROBNP 40974.0*   Hemoglobin A1C:  Recent Labs Lab 08/28/13 2107  HGBA1C 4.6   Fasting Lipid Panel:  Recent Labs Lab 08/28/13 2107  CHOL 165  HDL 32*  LDLCALC 108*  TRIG 126  CHOLHDL 5.2   Thyroid Function Tests:  Recent Labs Lab 08/28/13 2107  TSH 2.006   Coagulation:  Recent Labs Lab 08/29/13  0245  LABPROT 15.0  INR 1.21   Urine Drug Screen: Drugs of Abuse     Component Value Date/Time   LABOPIA NONE DETECTED 08/29/2013 0106   COCAINSCRNUR NONE DETECTED 08/29/2013 0106   LABBENZ NONE DETECTED 08/29/2013 0106   AMPHETMU NONE DETECTED 08/29/2013 0106   THCU NONE DETECTED 08/29/2013 0106   LABBARB NONE DETECTED 08/29/2013 0106    Urinalysis:  Recent Labs Lab 08/28/13 1426  COLORURINE YELLOW  LABSPEC 1.017  PHURINE 6.0  GLUCOSEU 100*  HGBUR SMALL*  BILIRUBINUR NEGATIVE  KETONESUR NEGATIVE  PROTEINUR >300*  UROBILINOGEN 0.2  NITRITE NEGATIVE  LEUKOCYTESUR SMALL*   Micro Results: Recent Results (from the past 240 hour(s))  URINE CULTURE     Status: None   Collection Time    08/28/13  2:26 PM      Result Value Range Status   Specimen Description URINE, CLEAN CATCH   Final   Special Requests NONE   Final   Culture  Setup Time     Final   Value: 08/28/2013 15:51     Performed at [place]     Final   Value: NO GROWTH     Performed at [facility]   Culture     Final   Value: NO GROWTH     Performed at [facility]   Report Status 08/29/2013 FINAL   Final  MRSA PCR SCREENING     Status: None   Collection Time    08/28/13  6:22 PM      Result Value  Range Status   MRSA by PCR NEGATIVE  NEGATIVE Final   Comment:            The GeneXpert MRSA Assay (FDA     approved for NASAL specimens     only), is one component of a     comprehensive MRSA colonization     surveillance program. It is not     intended to diagnose MRSA     infection nor to guide or     monitor treatment for     MRSA infections.   Medications: I have reviewed the patient's current medications. Scheduled Meds: . amLODipine  10 mg Oral QHS  . calcium acetate  1,334 mg Oral TID WC  . carvedilol  50 mg Oral BID WC  . cloNIDine  0.2 mg Oral BID  . darbepoetin (ARANESP) injection - DIALYSIS  200 mcg Intravenous Q Fri-HD  . doxercalciferol  4 mcg Intravenous Q M,W,F-HD  . feeding supplement (RESOURCE BREEZE)  1 Container Oral BID BM  . hydrALAZINE  100 mg Oral Q8H  . multivitamin  1 tablet Oral QHS  . multivitamin with minerals  1 tablet Oral Daily  . polyethylene glycol  17 g Oral Daily   Continuous Infusions:   PRN Meds:.nitroGLYCERIN, oxyCODONE Assessment/Plan: Principal Problem:   ESRD on dialysis Active Problems:   Systolic and diastolic CHF, acute on chronic   Hypertensive emergency   Anemia in chronic kidney disease   Hepatitis C   HTN (hypertension) Patrick Anthony is a 43 year old male with uncontrolled HTN, CKD stage 5, and combined CHF admitted for hypertensive emergency.   Hypertension--well controlled with HD, on coreg, norvasc, clonidine, and hydralazine. Difficult regimen but patient willing to be compliant and can afford.   ESRD on HD. S/P tunneled HD cath 1/23.  AVF placement wed.    Recent Labs Lab 08/29/13 2147 08/30/13 0330 08/31/13 0253 09/01/13  0500 09/02/13 0620  CREATININE 11.52* 11.88* 10.19* 12.05* 9.52*   -HD started 1/23, HD MWF, pending outpatient placement -Trend renal function  -Appreciate renal following  Anion gap metabolic acidosis in setting of ESRD-improving. AG 27 on admission, down to 17 today.  -continue HD per  renal  Hepatitis C--Reactive Ab.  HIV negative.  -will obtain records  Anemia--likely setting to chronic disease with ESRD. Hb 6.8 on admission, was 11.7 in October 2014. S/p 2 units PRBC 1/23.  -Hb stable in 8's  Chronic diastolic and systolic heart failure--EF 40-45% with diffuse hypokinesis and grade 2 DD per echo 04/2013.   -HD and continue po BP medications  Constipation--no BM yet, on Oxycodone that could be contributing a well. Passing gas.  -miralax bid -decrease oxycodone  Diet: Renal DVT Ppx: Heparin Dispo: Disposition is deferred at this time, awaiting improvement of current medical problems.  Anticipated discharge in approximately 1-2 day(s).    The patient does not have a current PCP (No Pcp Per Patient) and does need an [facility] hospital follow-up appointment after discharge.  The patient does not have transportation limitations that hinder transportation to clinic appointments.  Services Needed at time of discharge: Y = Yes, Blank = No PT:   OT:   RN:   Equipment:   Other:     LOS: 5 days   Jerene Pitch, MD 09/02/2013, 9:16 AM

## 2013-09-02 NOTE — Care Management Note (Addendum)
   CARE MANAGEMENT NOTE 09/02/2013  Patient:  Patrick Anthony, Patrick Anthony   Account Number:  [id number]  Date Initiated:  09/01/2013  Documentation initiated by:  Jasmine Pang  Subjective/Objective Assessment:   Noted CM consult for medicaiton assistance     Action/Plan:   Pt has Medicaid of [place] for medication benefits, therefore does not qualify for further assistance.  Will give pt phone number for H   Anticipated DC Date:  09/02/2013   Anticipated DC Plan:  HOME/SELF CARE         Choice offered to / List presented to:             Status of service:  Completed, signed off Medicare Important Message given?   (If response is "NO", the following Medicare IM given date fields will be blank) Date Medicare IM given:   Date Additional Medicare IM given:    Discharge Disposition:    Per UR Regulation:    If discussed at Long Length of Stay Meetings, dates discussed:    Comments:  09/02/2013 Met with pt and scheduled a followup appointment at [facility] , noted pt had appointment for Wed, September 03, 2013  however as we are still awaiting the CLIP precess for pt to begin outpatient hemodialysis, pt most likely will not be able to keep this appointment, therefore appointment was changed to [facility]. Sep 11, 2013 @ 5pm. Will follow for progression and any other d/c need. Will also include phone number for [facility] so if pt wants to research a PCP in this area who will accept his Medicaid for PCP. Jasmine Pang RN MPH, case manager, [phone]

## 2013-09-03 ENCOUNTER — Encounter (HOSPITAL_COMMUNITY): Payer: Self-pay | Admitting: Certified Registered Nurse Anesthetist

## 2013-09-03 ENCOUNTER — Encounter (HOSPITAL_COMMUNITY): Payer: Medicaid Other | Admitting: Certified Registered Nurse Anesthetist

## 2013-09-03 ENCOUNTER — Encounter (HOSPITAL_COMMUNITY)
Admission: EM | Disposition: A | Discharge status: Home / Self Care | Payer: Self-pay | Source: Home / Self Care | Attending: Internal Medicine

## 2013-09-03 ENCOUNTER — Ambulatory Visit: Payer: Medicaid - Out of State | Admitting: Internal Medicine

## 2013-09-03 ENCOUNTER — Inpatient Hospital Stay (HOSPITAL_COMMUNITY): Payer: Medicaid Other | Admitting: Certified Registered Nurse Anesthetist

## 2013-09-03 DIAGNOSIS — D631 Anemia in chronic kidney disease: Secondary | ICD-10-CM

## 2013-09-03 DIAGNOSIS — I5042 Chronic combined systolic (congestive) and diastolic (congestive) heart failure: Secondary | ICD-10-CM

## 2013-09-03 DIAGNOSIS — R079 Chest pain, unspecified: Secondary | ICD-10-CM

## 2013-09-03 DIAGNOSIS — K5900 Constipation, unspecified: Secondary | ICD-10-CM

## 2013-09-03 DIAGNOSIS — I120 Hypertensive chronic kidney disease with stage 5 chronic kidney disease or end stage renal disease: Principal | ICD-10-CM

## 2013-09-03 DIAGNOSIS — N039 Chronic nephritic syndrome with unspecified morphologic changes: Secondary | ICD-10-CM

## 2013-09-03 HISTORY — PX: AV FISTULA PLACEMENT: SHX1204

## 2013-09-03 LAB — RENAL FUNCTION PANEL
Albumin: 2.8 g/dL — ABNORMAL LOW (ref 3.5–5.2)
BUN: 74 mg/dL — ABNORMAL HIGH (ref 6–23)
CO2: 19 mEq/L (ref 19–32)
Calcium: 8.4 mg/dL (ref 8.4–10.5)
Chloride: 96 mEq/L (ref 96–112)
Creatinine, Ser: 11.19 mg/dL — ABNORMAL HIGH (ref 0.50–1.35)
GFR calc Af Amer: 6 mL/min — ABNORMAL LOW (ref >90)
GFR calc non Af Amer: 5 mL/min — ABNORMAL LOW (ref >90)
Glucose, Bld: 89 mg/dL (ref 70–99)
Phosphorus: 7.4 mg/dL — ABNORMAL HIGH (ref 2.3–4.6)
Potassium: 4.2 mEq/L (ref 3.7–5.3)
Sodium: 136 mEq/L — ABNORMAL LOW (ref 137–147)

## 2013-09-03 LAB — CBC
HCT: 26.9 % — ABNORMAL LOW (ref 39.0–52.0)
Hemoglobin: 8.6 g/dL — ABNORMAL LOW (ref 13.0–17.0)
MCH: 25.1 pg — ABNORMAL LOW (ref 26.0–34.0)
MCHC: 32 g/dL (ref 30.0–36.0)
MCV: 78.4 fL (ref 78.0–100.0)
Platelets: 214 10*3/uL (ref 150–400)
RBC: 3.43 MIL/uL — ABNORMAL LOW (ref 4.22–5.81)
RDW: 17.6 % — ABNORMAL HIGH (ref 11.5–15.5)
WBC: 6.6 10*3/uL (ref 4.0–10.5)

## 2013-09-03 LAB — GLUCOSE, CAPILLARY: Glucose-Capillary: 86 mg/dL (ref 70–99)

## 2013-09-03 SURGERY — ARTERIOVENOUS (AV) FISTULA CREATION
Anesthesia: Monitor Anesthesia Care | Site: Arm Upper | Laterality: Left

## 2013-09-03 MED ORDER — PROPOFOL INFUSION 10 MG/ML OPTIME
INTRAVENOUS | Status: DC | PRN
  Administered 2013-09-03: 75 ug/kg/min via INTRAVENOUS

## 2013-09-03 MED ORDER — ONDANSETRON HCL 4 MG/2ML IJ SOLN
INTRAMUSCULAR | Status: AC
  Filled 2013-09-03: qty 2

## 2013-09-03 MED ORDER — SODIUM CHLORIDE 0.9 % IR SOLN
Status: DC | PRN
  Administered 2013-09-03: 11:00:00

## 2013-09-03 MED ORDER — DARBEPOETIN ALFA-POLYSORBATE 200 MCG/0.4ML IJ SOLN
INTRAMUSCULAR | Status: AC
  Filled 2013-09-03: qty 0.4

## 2013-09-03 MED ORDER — SODIUM CHLORIDE 0.9 % IV SOLN
INTRAVENOUS | Status: DC | PRN
  Administered 2013-09-03: 11:00:00 via INTRAVENOUS

## 2013-09-03 MED ORDER — ONDANSETRON HCL 4 MG/2ML IJ SOLN
INTRAMUSCULAR | Status: DC | PRN
  Administered 2013-09-03: 4 mg via INTRAVENOUS

## 2013-09-03 MED ORDER — OXYCODONE HCL 5 MG PO TABS
ORAL_TABLET | ORAL | Status: AC
  Administered 2013-09-03: 5 mg via ORAL
  Filled 2013-09-03: qty 1

## 2013-09-03 MED ORDER — PSYLLIUM 95 % PO PACK
1.0000 | PACK | Freq: Every day | ORAL | Status: DC | PRN
  Filled 2013-09-03: qty 1

## 2013-09-03 MED ORDER — OXYCODONE HCL 5 MG PO TABS
5.0000 mg | ORAL_TABLET | Freq: Four times a day (QID) | ORAL | Status: DC | PRN
  Administered 2013-09-03 – 2013-09-04 (×3): 10 mg via ORAL
  Filled 2013-09-03 (×3): qty 2

## 2013-09-03 MED ORDER — OXYCODONE HCL 5 MG PO TABS
5.0000 mg | ORAL_TABLET | Freq: Once | ORAL | Status: AC | PRN
  Administered 2013-09-03: 5 mg via ORAL

## 2013-09-03 MED ORDER — LIDOCAINE-EPINEPHRINE (PF) 1 %-1:200000 IJ SOLN
INTRAMUSCULAR | Status: DC | PRN
  Administered 2013-09-03: 1.5 mL

## 2013-09-03 MED ORDER — 0.9 % SODIUM CHLORIDE (POUR BTL) OPTIME
TOPICAL | Status: DC | PRN
  Administered 2013-09-03: 1000 mL

## 2013-09-03 MED ORDER — OXYCODONE HCL 5 MG/5ML PO SOLN: 5.0000 mg | Freq: Once | ORAL | Status: AC | PRN

## 2013-09-03 MED ORDER — PROPOFOL 10 MG/ML IV BOLUS
INTRAVENOUS | Status: AC
  Filled 2013-09-03: qty 40

## 2013-09-03 MED ORDER — CEFAZOLIN SODIUM-DEXTROSE 2-3 GM-% IV SOLR
INTRAVENOUS | Status: AC
  Filled 2013-09-03: qty 50

## 2013-09-03 MED ORDER — FENTANYL CITRATE 0.05 MG/ML IJ SOLN
INTRAMUSCULAR | Status: DC | PRN
  Administered 2013-09-03 (×3): 50 ug via INTRAVENOUS

## 2013-09-03 MED ORDER — MIDAZOLAM HCL 5 MG/5ML IJ SOLN
INTRAMUSCULAR | Status: DC | PRN
  Administered 2013-09-03: 2 mg via INTRAVENOUS

## 2013-09-03 MED ORDER — MIDAZOLAM HCL 2 MG/2ML IJ SOLN
INTRAMUSCULAR | Status: AC
  Filled 2013-09-03: qty 2

## 2013-09-03 MED ORDER — DOCUSATE SODIUM 50 MG PO CAPS
50.0000 mg | ORAL_CAPSULE | Freq: Every day | ORAL | Status: DC
  Administered 2013-09-04: 50 mg via ORAL
  Filled 2013-09-03 (×2): qty 1

## 2013-09-03 MED ORDER — BUPIVACAINE HCL (PF) 0.5 % IJ SOLN
INTRAMUSCULAR | Status: DC | PRN
  Administered 2013-09-03: 1.5 mL

## 2013-09-03 MED ORDER — DOXERCALCIFEROL 4 MCG/2ML IV SOLN
INTRAVENOUS | Status: AC
  Filled 2013-09-03: qty 2

## 2013-09-03 MED ORDER — HYDROMORPHONE HCL PF 1 MG/ML IJ SOLN: 0.2500 mg | INTRAMUSCULAR | Status: DC | PRN

## 2013-09-03 MED ORDER — KIDNEY FAILURE BOOK
Freq: Once | Status: AC
  Administered 2013-09-03: 08:00:00
  Filled 2013-09-03: qty 1

## 2013-09-03 MED ORDER — FENTANYL CITRATE 0.05 MG/ML IJ SOLN
INTRAMUSCULAR | Status: AC
  Filled 2013-09-03: qty 5

## 2013-09-03 MED ORDER — ONDANSETRON HCL 4 MG/2ML IJ SOLN: 4.0000 mg | Freq: Once | INTRAMUSCULAR | Status: DC | PRN

## 2013-09-03 MED ORDER — CALCIUM ACETATE 667 MG PO CAPS
2001.0000 mg | ORAL_CAPSULE | Freq: Three times a day (TID) | ORAL | Status: DC
  Administered 2013-09-04 (×3): 2001 mg via ORAL
  Filled 2013-09-03 (×5): qty 3

## 2013-09-03 MED ORDER — CEFAZOLIN SODIUM-DEXTROSE 2-3 GM-% IV SOLR
INTRAVENOUS | Status: DC | PRN
  Administered 2013-09-03: 2 g via INTRAVENOUS

## 2013-09-03 SURGICAL SUPPLY — 40 items
ARMBAND PINK RESTRICT EXTREMIT (MISCELLANEOUS) ×2 IMPLANT
CANISTER SUCTION 2500CC (MISCELLANEOUS) ×2 IMPLANT
CATH EMB 3FR 40CM (CATHETERS) ×2 IMPLANT
CLIP TI MEDIUM 6 (CLIP) ×2 IMPLANT
CLIP TI WIDE RED SMALL 6 (CLIP) ×2 IMPLANT
COVER PROBE W GEL 5X96 (DRAPES) ×2 IMPLANT
COVER SURGICAL LIGHT HANDLE (MISCELLANEOUS) ×2 IMPLANT
DECANTER SPIKE VIAL GLASS SM (MISCELLANEOUS) IMPLANT
DERMABOND ADVANCED (GAUZE/BANDAGES/DRESSINGS) ×1
DERMABOND ADVANCED .7 DNX12 (GAUZE/BANDAGES/DRESSINGS) ×1 IMPLANT
ELECT REM PT RETURN 9FT ADLT (ELECTROSURGICAL) ×2
ELECTRODE REM PT RTRN 9FT ADLT (ELECTROSURGICAL) ×1 IMPLANT
GLOVE BIO SURGEON STRL SZ 6.5 (GLOVE) ×2 IMPLANT
GLOVE BIO SURGEON STRL SZ7 (GLOVE) ×2 IMPLANT
GLOVE BIOGEL PI IND STRL 6.5 (GLOVE) ×2 IMPLANT
GLOVE BIOGEL PI IND STRL 7.0 (GLOVE) ×2 IMPLANT
GLOVE BIOGEL PI IND STRL 7.5 (GLOVE) ×2 IMPLANT
GLOVE BIOGEL PI INDICATOR 6.5 (GLOVE) ×2
GLOVE BIOGEL PI INDICATOR 7.0 (GLOVE) ×2
GLOVE BIOGEL PI INDICATOR 7.5 (GLOVE) ×2
GLOVE SS BIOGEL STRL SZ 7 (GLOVE) ×1 IMPLANT
GLOVE SUPERSENSE BIOGEL SZ 7 (GLOVE) ×1
GOWN STRL REUS W/ TWL LRG LVL3 (GOWN DISPOSABLE) ×3 IMPLANT
GOWN STRL REUS W/TWL LRG LVL3 (GOWN DISPOSABLE) ×3
KIT BASIN OR (CUSTOM PROCEDURE TRAY) ×2 IMPLANT
KIT ROOM TURNOVER OR (KITS) ×2 IMPLANT
NEEDLE HYPO 25GX1X1/2 BEV (NEEDLE) IMPLANT
NS IRRIG 1000ML POUR BTL (IV SOLUTION) ×2 IMPLANT
PACK CV ACCESS (CUSTOM PROCEDURE TRAY) ×2 IMPLANT
PAD ARMBOARD 7.5X6 YLW CONV (MISCELLANEOUS) ×4 IMPLANT
SPONGE SURGIFOAM ABS GEL 100 (HEMOSTASIS) IMPLANT
SUT MNCRL AB 4-0 PS2 18 (SUTURE) ×2 IMPLANT
SUT PROLENE 6 0 BV (SUTURE) ×2 IMPLANT
SUT PROLENE 7 0 BV 1 (SUTURE) ×2 IMPLANT
SUT VIC AB 3-0 SH 27 (SUTURE) ×1
SUT VIC AB 3-0 SH 27X BRD (SUTURE) ×1 IMPLANT
TOWEL OR 17X24 6PK STRL BLUE (TOWEL DISPOSABLE) ×2 IMPLANT
TOWEL OR 17X26 10 PK STRL BLUE (TOWEL DISPOSABLE) ×2 IMPLANT
UNDERPAD 30X30 INCONTINENT (UNDERPADS AND DIAPERS) ×2 IMPLANT
WATER STERILE IRR 1000ML POUR (IV SOLUTION) IMPLANT

## 2013-09-03 NOTE — Progress Notes (Signed)
  I have seen and examined the patient, and reviewed the daily progress note by Corey Skains, MS 4 and discussed the care of the patient with them. Please see my progress note from 09/03/2013 for further details regarding assessment and plan.   Signed:  Jerene Pitch, MD 09/03/2013, 9:25 PM

## 2013-09-03 NOTE — Progress Notes (Signed)
09/03/2013 3:49 PM Hemodialysis Outpatient Note;this patient has been accepted at the [facility] on a TTS 2nd shift schedule. The patient has been instructed to visit the center on Friday January 30 at 10:00 AM to sign paperwork and consents and begin treatment on Saturday January 31,2015. Thank you. Gordy Savers

## 2013-09-03 NOTE — Progress Notes (Signed)
Medical Student Daily Progress Note  This is a 43 y.o. year old male admitted for hypertensive emergency with chest pain, SOB, headache, also with a history of CHF, CKD stage 5, cirrhosis, HCV, OSA (no CPAP), and substance abuse.  Subjective: Doing well this a.m., a little nervous about going to the OR for AVF placement. Congestion improved. No HA, chest pain, SOB, abd pain, nausea. Pain at dialysis catheter site continues to improve; patient tolerating it okay. Would strongly like to not be discharged today-- afraid of post-op complications which happened with his previous AVF.   Objective: Vital signs in last 24 hours: Filed Vitals:   09/03/13 1315 09/03/13 1400 09/03/13 1433 09/03/13 1438  BP: 107/79 125/82 116/74 110/75  Pulse: 64 68 62 65  Temp: 97.9 F (36.6 C) 98 F (36.7 C) 98 F (36.7 C)   TempSrc:  Oral Oral   Resp: 14 16 10 16   Height:      Weight:   72.9 kg (160 lb 11.5 oz)   SpO2: 100% 100% 100%      Filed Weights   09/01/13 1605 09/01/13 1900 09/03/13 1433  Weight: 71.9 kg (158 lb 8.2 oz) 71.442 kg (157 lb 8 oz) 72.9 kg (160 lb 11.5 oz)      Intake/Output Summary (Last 24 hours) at 09/03/13 1515 Last data filed at 09/03/13 1315  Gross per 24 hour  Intake    850 ml  Output   1100 ml  Net   -250 ml    Physical Exam: GENERAL: sitting up in bed, in NAD  NEURO: alert and oriented x3.  CHEST: RRR, II/VI systolic murmur. No rubs or gallops.  ABDOMEN: Normoactive bowel sounds. Soft, nontender, nondistended.  EXTREMITIES: No LE edema.   Lab Results: Results for orders placed during the hospital encounter of 08/28/13 (from the past 24 hour(s))  RENAL FUNCTION PANEL     Status: Abnormal   Collection Time    09/03/13  6:40 AM      Result Value Range   Sodium 136 (*) 137 - 147 mEq/L   Potassium 4.2  3.7 - 5.3 mEq/L   Chloride 96  96 - 112 mEq/L   CO2 19  19 - 32 mEq/L   Glucose, Bld 89  70 - 99 mg/dL   BUN 74 (*) 6 - 23 mg/dL   Creatinine, Ser 11.19 (*)  0.50 - 1.35 mg/dL   Calcium 8.4  8.4 - 10.5 mg/dL   Phosphorus 7.4 (*) 2.3 - 4.6 mg/dL   Albumin 2.8 (*) 3.5 - 5.2 g/dL   GFR calc non Af Amer 5 (*) >90 mL/min   GFR calc Af Amer 6 (*) >90 mL/min  CBC     Status: Abnormal   Collection Time    09/03/13  6:40 AM      Result Value Range   WBC 6.6  4.0 - 10.5 K/uL   RBC 3.43 (*) 4.22 - 5.81 MIL/uL   Hemoglobin 8.6 (*) 13.0 - 17.0 g/dL   HCT 26.9 (*) 39.0 - 52.0 %   MCV 78.4  78.0 - 100.0 fL   MCH 25.1 (*) 26.0 - 34.0 pg   MCHC 32.0  30.0 - 36.0 g/dL   RDW 17.6 (*) 11.5 - 15.5 %   Platelets 214  150 - 400 K/uL  GLUCOSE, CAPILLARY     Status: None   Collection Time    09/03/13  1:23 PM      Result Value Range   Glucose-Capillary 86  70 - 99 mg/dL   Medications:  Scheduled Meds: . amLODipine  10 mg Oral QHS  . calcium acetate  2,001 mg Oral TID WC  . carvedilol  50 mg Oral BID WC  . cloNIDine  0.2 mg Oral BID  . darbepoetin (ARANESP) injection - DIALYSIS  200 mcg Intravenous Q Wed-HD  . docusate sodium  50 mg Oral Daily  . doxercalciferol  4 mcg Intravenous Q M,W,F-HD  . feeding supplement (RESOURCE BREEZE)  1 Container Oral BID BM  . hydrALAZINE  100 mg Oral Q8H  . multivitamin  1 tablet Oral QHS  . multivitamin with minerals  1 tablet Oral Daily  . polyethylene glycol  17 g Oral BID   Continuous Infusions:  PRN Meds:.nitroGLYCERIN, oxyCODONE, psyllium  Assessment/Plan:  Hypertensive Emergency with CHF exacerbation: BP 100s-110s/60s-80s now. On home meds. Down 9.8L from admission. - amlodipine 10 mg po QHS   - carvedilol 25 mg po BID  - clonidine 0.2 mg po BID  - hydralazine 100 mg po TID  - if BP becomes elevated again, would consider increasing clonidine to TID.   AoCKD stage V: BMET stable today. S/p placement of RIJ tunneled dialysis catheter 1/23. To OR today for placement of L arm AV fistula. - HD today, will be on Tues/Thurs/Sat schedule per nephrology note. - MVI  - calcium acetate 667 mg TID  -  doxercalciferol injections with HD  - appreciate nephrology recommendations and management of HD.   Anemia: Stable. Likely combination of chronic renal failure -> underproduction of RBCs as well as iron deficiency, though recent iron panel was performed post transfusion so not particularly revealing.  - weekly darbopoetin on Fri's.   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 ALP 35, AST/ALT wnl.  - caution with hepatically metabolized med dosing. - will consider GI/hepatology follow up - patient needs hepatitis B vaccination given negative Hb S Ab, on chronic hemodialysis, hx substance use - this may need to occur in the outpatient setting. Will include in discharge summary.   LOS: 6 days   This is a Careers information officer Note.  The care of the patient was discussed with Dr. Eula Fried and the assessment and plan formulated with their assistance.  Please see their attached note for official documentation of the daily encounter.  Ronnica Dreese 09/03/2013, 3:15 PM

## 2013-09-03 NOTE — Progress Notes (Signed)
Subjective: Patrick Anthony was seen and examined at bedside this morning.  He is set AVF placement today.  Likely HD again today.  He has been set up for outpatient HD but need to verify schedule. He reports improved pain at HD catheter site today.   Objective: Vital signs in last 24 hours: Filed Vitals:   09/03/13 1230 09/03/13 1245 09/03/13 1300 09/03/13 1315  BP: 102/62 102/73 99/74 107/79  Pulse: 65 63 62 64  Temp:    97.9 F (36.6 C)  TempSrc:      Resp: 14 15 17 14   Height:      Weight:      SpO2: 100% 100% 100% 100%   Weight change:   Intake/Output Summary (Last 24 hours) at 09/03/13 1408 Last data filed at 09/03/13 1219  Gross per 24 hour  Intake    610 ml  Output   1100 ml  Net   -490 ml   Vitals reviewed. General: resting in bed, NAD HEENT: PERRL Cardiac: RRR, +SEM 2/6 left sternal burder  Pulm: CTA B/L b/l, -wheezing or rales Abd: soft, nontender, BS present Ext: warm and well perfused, no edema, moving all 4 extremities Neuro: alert and oriented X3, cranial nerves II-XII grossly intact, strength equal in bilateral upper and lower extremities  Lab Results: Basic Metabolic Panel:  Recent Labs Lab 08/29/13 0245  09/02/13 0620 09/03/13 0640  NA 142  < > 137 136*  K 3.3*  < > 4.1 4.2  CL 102  < > 97 96  CO2 14*  < > 23 19  GLUCOSE 97  < > 96 89  BUN 118*  < > 60* 74*  CREATININE 15.86*  < > 9.52* 11.19*  CALCIUM 8.1*  < > 8.4 8.4  MG 2.0  --   --   --   PHOS 6.7*  < > 6.3* 7.4*  < > = values in this interval not displayed. Liver Function Tests:  Recent Labs Lab 08/28/13 1253  09/02/13 0620 09/03/13 0640  AST 18  --   --   --   ALT 16  --   --   --   ALKPHOS 35*  --   --   --   BILITOT 0.3  --   --   --   PROT 7.3  --   --   --   ALBUMIN 3.1*  < > 2.9* 2.8*  < > = values in this interval not displayed. CBC:  Recent Labs Lab 08/28/13 1253  09/01/13 0500 09/03/13 0640  WBC 7.0  < > 6.0 6.6  NEUTROABS 5.5  --   --   --   HGB 6.8*  < >  8.5* 8.6*  HCT 20.4*  < > 25.7* 26.9*  MCV 75.6*  < > 78.6 78.4  PLT 205  < > 192 214  < > = values in this interval not displayed. Cardiac Enzymes:  Recent Labs Lab 08/28/13 2107 08/29/13 0245 08/29/13 0815  TROPONINI <0.30 <0.30 <0.30   BNP:  Recent Labs Lab 08/28/13 1253  PROBNP 40974.0*   Hemoglobin A1C:  Recent Labs Lab 08/28/13 2107  HGBA1C 4.6   Fasting Lipid Panel:  Recent Labs Lab 08/28/13 2107  CHOL 165  HDL 32*  LDLCALC 108*  TRIG 126  CHOLHDL 5.2   Thyroid Function Tests:  Recent Labs Lab 08/28/13 2107  TSH 2.006   Coagulation:  Recent Labs Lab 08/29/13 0245  LABPROT 15.0  INR 1.21  Urine Drug Screen: Drugs of Abuse     Component Value Date/Time   LABOPIA NONE DETECTED 08/29/2013 0106   COCAINSCRNUR NONE DETECTED 08/29/2013 0106   LABBENZ NONE DETECTED 08/29/2013 0106   AMPHETMU NONE DETECTED 08/29/2013 0106   THCU NONE DETECTED 08/29/2013 0106   LABBARB NONE DETECTED 08/29/2013 0106    Urinalysis:  Recent Labs Lab 08/28/13 1426  COLORURINE YELLOW  LABSPEC 1.017  PHURINE 6.0  GLUCOSEU 100*  HGBUR SMALL*  BILIRUBINUR NEGATIVE  KETONESUR NEGATIVE  PROTEINUR >300*  UROBILINOGEN 0.2  NITRITE NEGATIVE  LEUKOCYTESUR SMALL*   Micro Results: Recent Results (from the past 240 hour(s))  URINE CULTURE     Status: None   Collection Time    08/28/13  2:26 PM      Result Value Range Status   Specimen Description URINE, CLEAN CATCH   Final   Special Requests NONE   Final   Culture  Setup Time     Final   Value: 08/28/2013 15:51     Performed at [facility]     Final   Value: NO GROWTH     Performed at [facility]   Culture     Final   Value: NO GROWTH     Performed at [facility]   Report Status 08/29/2013 FINAL   Final  MRSA PCR SCREENING     Status: None   Collection Time    08/28/13  6:22 PM      Result Value Range Status   MRSA by PCR NEGATIVE  NEGATIVE Final   Comment:             The GeneXpert MRSA Assay (FDA     approved for NASAL specimens     only), is one component of a     comprehensive MRSA colonization     surveillance program. It is not     intended to diagnose MRSA     infection nor to guide or     monitor treatment for     MRSA infections.   Medications: I have reviewed the patient's current medications. Scheduled Meds: . amLODipine  10 mg Oral QHS  . calcium acetate  2,001 mg Oral TID WC  . carvedilol  50 mg Oral BID WC  . cloNIDine  0.2 mg Oral BID  . darbepoetin (ARANESP) injection - DIALYSIS  200 mcg Intravenous Q Wed-HD  . doxercalciferol  4 mcg Intravenous Q M,W,F-HD  . feeding supplement (RESOURCE BREEZE)  1 Container Oral BID BM  . hydrALAZINE  100 mg Oral Q8H  . multivitamin  1 tablet Oral QHS  . multivitamin with minerals  1 tablet Oral Daily  . polyethylene glycol  17 g Oral BID   Continuous Infusions:   PRN Meds:.nitroGLYCERIN, oxyCODONE Assessment/Plan: Principal Problem:   ESRD on dialysis Active Problems:   Systolic and diastolic CHF, acute on chronic   Hypertensive emergency   Anemia in chronic kidney disease   Hepatitis C   HTN (hypertension) Patrick Anthony is a 43 year old male with uncontrolled HTN, CKD stage 5, and combined CHF admitted for hypertensive emergency.   Hypertension--well controlled with HD, on coreg, norvasc, clonidine, and hydralazine.  -will d/c on current regimen. Patient says he will do his best to follow current regimen and hopefully will be able to simplify as outpatient  ESRD on HD. S/P tunneled HD cath 1/23.  AVF placement wed.    Recent Labs Lab 08/30/13 0330  08/31/13 0253 09/01/13 0500 09/02/13 0620 09/03/13 0640  CREATININE 11.88* 10.19* 12.05* 9.52* 11.19*   -HD started 1/23, HD currently MWF, outpatient HD set up at [facility] but days to be determined possibly today. Possible HD today?  -Trend renal function  -Appreciate renal following  Anion gap metabolic  acidosis in setting of ESRD-improving. AG 27 on admission, trending down and improving with HD -continue HD per renal  Hepatitis C--Reactive Ab.  HIV negative.  -negative for Hepatitis B surface Ag, surface Ab, core total Ab, and IgM -will likely need vaccination for Hep B if not seen in prior records  Anemia of chronic disease in setting of ESRD on HD. S/p 2 units prbc this admission on 08/29/13. No evidence of acute bleeding.  -Hb stable in 8's  Chronic diastolic and systolic heart failure--EF 40-45% with diffuse hypokinesis and grade 2 DD per echo 04/2013.   -HD and continue po BP medications  Constipation--no BM yet, on Oxycodone that could be contributing a well. Passing gas.  -miralax bid -decrease oxycodone -psyllium -colace  Diet: Renal DVT Ppx: Heparin Dispo: d/c home likely tomorrow     The patient does not have a current PCP (No Pcp Per Patient) and does need an [facility] hospital follow-up appointment after discharge.  The patient does not have transportation limitations that hinder transportation to clinic appointments.  Services Needed at time of discharge: Y = Yes, Blank = No PT:   OT:   RN:   Equipment:   Other:     LOS: 6 days   Jerene Pitch, MD 09/03/2013, 2:08 PM

## 2013-09-03 NOTE — Op Note (Signed)
OPERATIVE NOTE   PROCEDURE: left brachiocephalic arteriovenous fistula placement  PRE-OPERATIVE DIAGNOSIS: end stage renal disease   POST-OPERATIVE DIAGNOSIS: same as above   SURGEON: Adele Barthel, MD  ASSISTANT(S): Gerri Lins, PAC   ANESTHESIA: local and MAC  ESTIMATED BLOOD LOSS: 50 cc  FINDING(S): 1.  Scarred cephalic vein 2.  No thrombus obtained on passing Fogarty catheter 3.  Palpable thrill at end of case with palpable radial pulse  SPECIMEN(S):  none  INDICATIONS:   Patrick Anthony is a 43 y.o. male who presents with end stage renal disease.  The patient is scheduled for left brachiocephalic arteriovenous fistula placement.  The patient is aware the risks include but are not limited to: bleeding, infection, steal syndrome, nerve damage, ischemic monomelic neuropathy, failure to mature, and need for additional procedures.  The patient is aware of the risks of the procedure and elects to proceed forward.  DESCRIPTION: After full informed written consent was obtained from the patient, the patient was brought back to the operating room and placed supine upon the operating table.  Prior to induction, the patient received IV antibiotics.   After obtaining adequate anesthesia, the patient was then prepped and draped in the standard fashion for a left arm access procedure.  I turned my attention first to identifying the patient's cephalic vein and brachial artery.  Using SonoSite guidance, the location of these vessels were marked out on the skin.   At this point, I injected local anesthetic to obtain a field block of the antecubitum.  In total, I injected about 10 mL of a 1:1 mixture of 0.5% Marcaine without epinephrine and 1% lidocaine with epinephrine.  I made a transverse incision at the level of the antecubitum and dissected through the subcutaneous tissue and fascia to gain exposure of the brachial artery.  This was noted to be 4 mm in diameter externally.  This was  dissected out proximally and distally and controlled with vessel loops .  I then dissected out the cephalic vein with some difficulty due to prior scar tissue around the vein.  This was noted to be 3 mm in diameter externally.  The distal segment of the vein was ligated with a  2-0 silk, and the vein was transected.  No blood return was noted and there was prior history of thrombus in the vein, so I obtained a 3 Fogarty balloon and passed it proximally.  I obtained not thrombus but there was some resistance to the balloon, likely valves disrupted with the balloon.  The proximal segment was iinterrogated with serial dilators.  The vein accepted up to a 3 mm dilator without any difficulty.  I then instilled the heparinized saline into the vein and clamped it.  At this point, I reset my exposure of the brachial artery and placed the artery under tension proximally and distally.  I made an arteriotomy with a #11 blade, and then I extended the arteriotomy with a Potts scissor.  I injected heparinized saline proximal and distal to this arteriotomy.  The vein was then sewn to the artery in an end-to-side configuration with a running stitch of 6-0 Prolene.  Prior to completing this anastomosis, I allowed the vein and artery to backbleed.  There was no evidence of clot from any vessels.  I completed the anastomosis in the usual fashion and then released all vessel loops and clamps.  There was a palpable thrill in the venous outflow, and there was a palpable radial pulse.  At this  point, I irrigated out the surgical wound.  There was no further active bleeding.  The subcutaneous tissue was reapproximated with a running stitch of 3-0 Vicryl.  The skin was then reapproximated with a running subcuticular stitch of 4-0 Vicryl.  The skin was then cleaned, dried, and reinforced with Dermabond.  The patient tolerated this procedure well.   COMPLICATIONS: none  CONDITION: stable  Adele Barthel, MD [facility]  [place] Office: [phone] Pager: [phone]  09/03/2013, 12:05 PM

## 2013-09-03 NOTE — Preoperative (Signed)
Beta Blockers   Reason not to administer Beta Blockers:coreg 09-02-13 @1700 

## 2013-09-03 NOTE — Progress Notes (Signed)
Internal Medicine Attending  Date: 09/03/2013  Patient name: Patrick Anthony Medical record number: [id number] Date of birth: 01-03-1971 Age: 43 y.o. Gender: male  I saw and evaluated the patient, and discussed his care on A.M rounds with housestaff.  I reviewed the resident's note by Dr. Eula Fried and I agree with the resident's findings and plans as documented in her note.

## 2013-09-03 NOTE — Procedures (Signed)
I have personally attended this patient's dialysis session.  TDC  At 300. Tight heparin. UF goal 2 liters. 3 1/2 hours -->increase to 4 hours with next treatment. EDW will be post HD weight today. 2K bath.    Leighton Brickley B

## 2013-09-03 NOTE — Transfer of Care (Signed)
Immediate Anesthesia Transfer of Care Note  Patient: Patrick Anthony  Procedure(s) Performed: Procedure(s): ARTERIOVENOUS (AV) FISTULA CREATION - LEFT BRACHIOCEPHALIC (Left)  Patient Location: PACU  Anesthesia Type:MAC  Level of Consciousness: awake and alert   Airway & Oxygen Therapy: Patient Spontanous Breathing and Patient connected to face mask oxygen  Post-op Assessment: Report given to PACU RN and Post -op Vital signs reviewed and stable  Post vital signs: Reviewed and stable  Complications: No apparent anesthesia complications

## 2013-09-03 NOTE — Anesthesia Preprocedure Evaluation (Addendum)
Anesthesia Evaluation  Patient identified by MRN, date of birth, ID band Patient awake    Reviewed: Allergy & Precautions, H&P , NPO status , Patient's Chart, lab work & pertinent test results, reviewed documented beta blocker date and time   Airway Mallampati: II TM Distance: >3 FB Neck ROM: Full    Dental  (+) Teeth Intact and Dental Advisory Given   Pulmonary sleep apnea ,  breath sounds clear to auscultation        Cardiovascular hypertension, Pt. on medications +CHF Rhythm:Regular     Neuro/Psych    GI/Hepatic   Endo/Other    Renal/GU CRF and DialysisRenal disease     Musculoskeletal   Abdominal   Peds  Hematology   Anesthesia Other Findings   Reproductive/Obstetrics                          Anesthesia Physical Anesthesia Plan  ASA: III  Anesthesia Plan: MAC   Post-op Pain Management:    Induction: Intravenous  Airway Management Planned: LMA  Additional Equipment:   Intra-op Plan:   Post-operative Plan:   Informed Consent: I have reviewed the patients History and Physical, chart, labs and discussed the procedure including the risks, benefits and alternatives for the proposed anesthesia with the patient or authorized representative who has indicated his/her understanding and acceptance.   Dental advisory given  Plan Discussed with: CRNA, Anesthesiologist and Surgeon  Anesthesia Plan Comments:        Anesthesia Quick Evaluation

## 2013-09-03 NOTE — H&P (Signed)
    Brief History and Physical  History of Present Illness  Patrick Anthony is a 43 y.o. male who presents with chief complaint: ESRD.  The patient presents today for L arm AVF placement.    Past Medical History  Diagnosis Date  . Hypertension   . CHF (congestive heart failure)   . Renal disorder renal failure  . Cirrhosis of liver   . Drug addiction   . ETOH abuse   . Sleep apnea   . Hepatitis     Past Surgical History  Procedure Laterality Date  . Av fistula placement Right   . Insertion of dialysis catheter N/A 08/29/2013    Procedure: INSERTION OF DIALYSIS CATHETER;  Surgeon: Mal Misty, MD;  Location: [facility];  Service: Vascular;  Laterality: N/A;    History   Social History  . Marital Status: Single    Spouse Name: N/A    Number of Children: N/A  . Years of Education: N/A   Occupational History  . Not on file.   Social History Main Topics  . Smoking status: Never Smoker   . Smokeless tobacco: Not on file  . Alcohol Use: Yes  . Drug Use: No     Comment: Quit  . Sexual Activity: Not on file   Other Topics Concern  . Not on file   Social History Narrative  . No narrative on file    History reviewed. No pertinent family history.  No current facility-administered medications on file prior to encounter.   Current Outpatient Prescriptions on File Prior to Encounter  Medication Sig Dispense Refill  . Vitamin D, Ergocalciferol, (DRISDOL) 50000 UNITS CAPS capsule Take 50,000 Units by mouth every 7 (seven) days. Takes on Tuesday      . nitroGLYCERIN (NITROSTAT) 0.4 MG SL tablet Place 0.4 mg under the tongue every 5 (five) minutes as needed for chest pain.        No Known Allergies  Review of Systems: As listed above, otherwise negative.  Physical Examination  Filed Vitals:   09/02/13 2036 09/03/13 0437 09/03/13 0654 09/03/13 0945  BP: 122/77 129/83 121/84 133/93  Pulse: 78 74  68  Temp: 98.1 F (36.7 C) 99 F (37.2 C)  98.2 F (36.8 C)  TempSrc:  Oral Oral  Oral  Resp: 16 16  18   Height:      Weight:      SpO2: 100% 100%  100%    General: A&O x 3, WDWN  Pulmonary: Sym exp, good air movt, CTAB, no rales, rhonchi, & wheezing  Cardiac: RRR, Nl S1, S2, no Murmurs, rubs or gallops  Gastrointestinal: soft, NTND, -G/R, - HSM, - masses, - CVAT B  Musculoskeletal: M/S 5/5 throughout , Extremities without ischemic changes , previous IV site evident in L arm  Laboratory See Patrick Anthony is a 43 y.o. male who presents with: ESRD.   The patient is scheduled for: L arm AVF placement (likely BC vs BVT)  Risk, benefits, and alternatives to access surgery were discussed.  The patient is aware the risks include but are not limited to: bleeding, infection, steal syndrome, nerve damage, ischemic monomelic neuropathy, failure to mature, and need for additional procedures.  The patient is aware of the risks and agrees to proceed.  Adele Barthel, MD [facility] [place] Office: [phone] Pager: [phone]  09/03/2013, 10:18 AM

## 2013-09-03 NOTE — Progress Notes (Signed)
Patient has a TTS 2nd shift dialysis spot at [facility] If discharged today: will need to be there tomorrow at 10:30 AM to sign paperwork then dialysis afterward If discharged tomorrow: will need to go to the dialysis unit on Friday at 10AM to sign papers and then back for dialysis on Saturday for the 2nd shift. He will need to notify his transportation company so he can be picked up.

## 2013-09-03 NOTE — Anesthesia Postprocedure Evaluation (Signed)
  Anesthesia Post-op Note  Patient: Patrick Anthony  Procedure(s) Performed: Procedure(s): ARTERIOVENOUS (AV) FISTULA CREATION - LEFT BRACHIOCEPHALIC (Left)  Patient Location: PACU  Anesthesia Type:MAC  Level of Consciousness: awake, alert  and oriented  Airway and Oxygen Therapy: Patient Spontanous Breathing and Patient connected to nasal cannula oxygen  Post-op Pain: mild  Post-op Assessment: Post-op Vital signs reviewed  Post-op Vital Signs: Reviewed  Complications: No apparent anesthesia complications
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
## 2013-09-04 ENCOUNTER — Other Ambulatory Visit: Payer: Self-pay | Admitting: *Deleted

## 2013-09-04 ENCOUNTER — Encounter (HOSPITAL_COMMUNITY): Payer: Self-pay | Admitting: Vascular Surgery

## 2013-09-04 DIAGNOSIS — N186 End stage renal disease: Secondary | ICD-10-CM

## 2013-09-04 DIAGNOSIS — K7460 Unspecified cirrhosis of liver: Secondary | ICD-10-CM

## 2013-09-04 DIAGNOSIS — Z4931 Encounter for adequacy testing for hemodialysis: Secondary | ICD-10-CM

## 2013-09-04 LAB — CBC
HCT: 25.5 % — ABNORMAL LOW (ref 39.0–52.0)
Hemoglobin: 8.4 g/dL — ABNORMAL LOW (ref 13.0–17.0)
MCH: 26.3 pg (ref 26.0–34.0)
MCHC: 32.9 g/dL (ref 30.0–36.0)
MCV: 79.7 fL (ref 78.0–100.0)
Platelets: 197 10*3/uL (ref 150–400)
RBC: 3.2 MIL/uL — ABNORMAL LOW (ref 4.22–5.81)
RDW: 17.7 % — ABNORMAL HIGH (ref 11.5–15.5)
WBC: 5.4 10*3/uL (ref 4.0–10.5)

## 2013-09-04 LAB — RENAL FUNCTION PANEL
Albumin: 2.8 g/dL — ABNORMAL LOW (ref 3.5–5.2)
BUN: 41 mg/dL — ABNORMAL HIGH (ref 6–23)
CO2: 25 mEq/L (ref 19–32)
Calcium: 7.8 mg/dL — ABNORMAL LOW (ref 8.4–10.5)
Chloride: 94 mEq/L — ABNORMAL LOW (ref 96–112)
Creatinine, Ser: 7.31 mg/dL — ABNORMAL HIGH (ref 0.50–1.35)
GFR calc Af Amer: 10 mL/min — ABNORMAL LOW (ref >90)
GFR calc non Af Amer: 8 mL/min — ABNORMAL LOW (ref >90)
Glucose, Bld: 93 mg/dL (ref 70–99)
Phosphorus: 6 mg/dL — ABNORMAL HIGH (ref 2.3–4.6)
Potassium: 4.1 mEq/L (ref 3.7–5.3)
Sodium: 134 mEq/L — ABNORMAL LOW (ref 137–147)

## 2013-09-04 MED ORDER — CLONIDINE HCL 0.2 MG PO TABS: 0.2000 mg | ORAL_TABLET | Freq: Two times a day (BID) | ORAL | Status: DC

## 2013-09-04 MED ORDER — RENA-VITE PO TABS: 1.0000 | ORAL_TABLET | Freq: Every day | ORAL | Status: DC

## 2013-09-04 MED ORDER — DIPHENHYDRAMINE HCL 25 MG PO CAPS: 25.0000 mg | ORAL_CAPSULE | Freq: Two times a day (BID) | ORAL | Status: DC | PRN

## 2013-09-04 MED ORDER — CARVEDILOL 25 MG PO TABS: 50.0000 mg | ORAL_TABLET | Freq: Two times a day (BID) | ORAL | Status: DC

## 2013-09-04 MED ORDER — POLYETHYLENE GLYCOL 3350 17 G PO PACK: 17.0000 g | PACK | Freq: Two times a day (BID) | ORAL | Status: DC

## 2013-09-04 MED ORDER — AMLODIPINE BESYLATE 10 MG PO TABS: 10.0000 mg | ORAL_TABLET | Freq: Every day | ORAL | Status: DC

## 2013-09-04 MED ORDER — HYDRALAZINE HCL 100 MG PO TABS: 100.0000 mg | ORAL_TABLET | Freq: Three times a day (TID) | ORAL | Status: DC

## 2013-09-04 MED ORDER — OXYCODONE HCL 5 MG PO TABS: 5.0000 mg | ORAL_TABLET | Freq: Four times a day (QID) | ORAL | Status: DC | PRN

## 2013-09-04 MED ORDER — CALCIUM ACETATE 667 MG PO CAPS: 2001.0000 mg | ORAL_CAPSULE | Freq: Three times a day (TID) | ORAL | Status: DC

## 2013-09-04 MED ORDER — DIPHENHYDRAMINE HCL 25 MG PO CAPS
25.0000 mg | ORAL_CAPSULE | Freq: Two times a day (BID) | ORAL | Status: DC | PRN
  Administered 2013-09-04: 25 mg via ORAL
  Filled 2013-09-04: qty 1

## 2013-09-04 MED ORDER — DOCUSATE SODIUM 50 MG PO CAPS: 50.0000 mg | ORAL_CAPSULE | Freq: Every day | ORAL | Status: DC

## 2013-09-04 NOTE — Progress Notes (Signed)
Discharge instructions with follow-up appts and prescriptions given to patient. Handouts on CKD, CHF, renal diet, and phoslo given to patient. Daily weight log also given to patient. PIV removed. Pt to call for transportation.  Aaron Edelman, Kemia Wendel Girard

## 2013-09-04 NOTE — Discharge Instructions (Signed)
1. You have hospital follow up appointments as follows:  #: with [facility]oint[facility] for Wed, Sep 11, 2013 at [place] [id number] [phone]  #: Dialysis at [facility]. You will need to go to the dialysis center tomorrow (Friday) to register and then again on Saturday for dialysis. You will be receiving dialysis every Tuesday, Thursday, and Saturday.  #: Nephrology: You will be seen by the nephrologists (kidney doctors) at the kidney center once a week during dialysis.   #: Vascular surgery: Dr. Lianne Moris office will call you in the next few weeks with a follow up appointment. The appointment will be about six weeks after the date of your surgery.   2. Please take all medications as prescribed.   You will START several new medications, including: Calcium Acetate (Phoslo) Diphenhydramine (Benadryl) Docusate (Colace) A new multivitamin Oxycodone Polyethylene Glycol (Miralax).  CHANGE the dose or timing of the following medications as instructed: Amlodipine Carvedilol Clonidine Hydralazine  STOP taking the following medications: Calcitriol Furosemide (Lasix) Your old multivitamin Vitamin D  3. If you have worsening of your symptoms or new symptoms arise, please call the clinic [phone]), or go to the ER immediately if symptoms are severe. Symptoms to look for include severe headache, chest pain, difficulty breathing, abdominal pain, and swelling of your arm or of your legs.  You have done great job in taking all your medications. I appreciate it very much. Please continue doing that.

## 2013-09-04 NOTE — Progress Notes (Signed)
[facility] [place] Postoperative hemodialysis access   Date of Surgery:  09/04/13 Surgeon: Bridgett Larsson  Subjective:  No complaints  PHYSICAL EXAMINATION:  Filed Vitals:   09/04/13 1021  BP: 127/77  Pulse: 75  Temp:   Resp:     Incision is c/d/i Hand grip is equal bilaterally  Sensation in digits is intact;  There is  Thrill  There is bruit. The graft/fistula is palpable    ASSESSMENT/PLAN:  Patrick Anthony is a 43 y.o. year old male who is s/p left brachiocephalic AVF.  -graft is patent -pt does not have evidence of steal sx -f/u with Dr. Bridgett Larsson in 4-6 weeks to check maturation of AVF -will sign off-call as needed.   Leontine Locket, PA-C [facility] [phone]

## 2013-09-04 NOTE — Progress Notes (Signed)
Medical Student Daily Progress Note  This is a 43 y.o. year old male admitted for hypertensive emergency with chest pain, SOB, headache, also with a history of CHF, CKD stage 5, cirrhosis, HCV, OSA (no CPAP), and substance abuse.  Subjective: Doing well today except for some mild diffuse itching, which the patient states has been going on for a long time "since I started taking my medicines again." He denies skin rash or welts as well as facial swelling and difficulty breathing. He has no chest pain or SOB today, no abdominal pain. Pain at tunneled cath site continues to improve. Some pain at AVF site but patient says it is tolerable. He is very impressed by the cosmetic outcome of the AVF placement and is excited to go home today. He is aware of the plan for outpatient HD follow up which includes going to the HD center tomorrow to register and this Saturday for dialysis.   Objective: Vital signs in last 24 hours: Filed Vitals:   09/03/13 1850 09/03/13 2139 09/03/13 2159 09/04/13 0528  BP: 129/86 128/88 120/77 118/81  Pulse: 71  84 75  Temp: 98 F (36.7 C)  98.8 F (37.1 C) 98.1 F (36.7 C)  TempSrc: Oral  Oral Oral  Resp: 14  16 14   Height:      Weight:      SpO2: 100%  99% 100%   Filed Weights   09/01/13 1900 09/03/13 1433 09/03/13 1804  Weight: 71.442 kg (157 lb 8 oz) 72.9 kg (160 lb 11.5 oz) 70.3 kg (154 lb 15.7 oz)     Intake/Output Summary (Last 24 hours) at 09/04/13 0903 Last data filed at 09/04/13 0040  Gross per 24 hour  Intake    850 ml  Output   2000 ml  Net  -1150 ml    Physical Exam: GENERAL: sitting up in bed, in NAD   NEURO: alert and oriented x3.  CHEST: RRR, II/VI systolic murmur. No rubs or gallops.  ABDOMEN: Normoactive bowel sounds. Soft, nontender, nondistended.  EXTREMITIES: No LE edema. L AVF with bruit present, surgical incision intact and sealed with dermabond. No surrounding erythema or tenderness.    Lab Results: Results for orders placed  during the hospital encounter of 08/28/13 (from the past 24 hour(s))  GLUCOSE, CAPILLARY     Status: None   Collection Time    09/03/13  1:23 PM      Result Value Range   Glucose-Capillary 86  70 - 99 mg/dL  RENAL FUNCTION PANEL     Status: Abnormal   Collection Time    09/04/13  3:47 AM      Result Value Range   Sodium 134 (*) 137 - 147 mEq/L   Potassium 4.1  3.7 - 5.3 mEq/L   Chloride 94 (*) 96 - 112 mEq/L   CO2 25  19 - 32 mEq/L   Glucose, Bld 93  70 - 99 mg/dL   BUN 41 (*) 6 - 23 mg/dL   Creatinine, Ser 7.31 (*) 0.50 - 1.35 mg/dL   Calcium 7.8 (*) 8.4 - 10.5 mg/dL   Phosphorus 6.0 (*) 2.3 - 4.6 mg/dL   Albumin 2.8 (*) 3.5 - 5.2 g/dL   GFR calc non Af Amer 8 (*) >90 mL/min   GFR calc Af Amer 10 (*) >90 mL/min  CBC     Status: Abnormal   Collection Time    09/04/13  3:47 AM      Result Value Range   WBC 5.4  4.0 - 10.5 K/uL   RBC 3.20 (*) 4.22 - 5.81 MIL/uL   Hemoglobin 8.4 (*) 13.0 - 17.0 g/dL   HCT 25.5 (*) 39.0 - 52.0 %   MCV 79.7  78.0 - 100.0 fL   MCH 26.3  26.0 - 34.0 pg   MCHC 32.9  30.0 - 36.0 g/dL   RDW 17.7 (*) 11.5 - 15.5 %   Platelets 197  150 - 400 K/uL    Micro Results: Recent Results (from the past 240 hour(s))  URINE CULTURE     Status: None   Collection Time    08/28/13  2:26 PM      Result Value Range Status   Specimen Description URINE, CLEAN CATCH   Final   Special Requests NONE   Final   Culture  Setup Time     Final   Value: 08/28/2013 15:51     Performed at [facility]     Final   Value: NO GROWTH     Performed at [facility]   Culture     Final   Value: NO GROWTH     Performed at [facility]   Report Status 08/29/2013 FINAL   Final  MRSA PCR SCREENING     Status: None   Collection Time    08/28/13  6:22 PM      Result Value Range Status   MRSA by PCR NEGATIVE  NEGATIVE Final   Comment:            The GeneXpert MRSA Assay (FDA     approved for NASAL specimens     only), is one component  of a     comprehensive MRSA colonization     surveillance program. It is not     intended to diagnose MRSA     infection nor to guide or     monitor treatment for     MRSA infections.    Studies/Results: No results found.  Medications:  Scheduled Meds: . amLODipine  10 mg Oral QHS  . calcium acetate  2,001 mg Oral TID WC  . carvedilol  50 mg Oral BID WC  . cloNIDine  0.2 mg Oral BID  . darbepoetin (ARANESP) injection - DIALYSIS  200 mcg Intravenous Q Wed-HD  . docusate sodium  50 mg Oral Daily  . doxercalciferol  4 mcg Intravenous Q M,W,F-HD  . feeding supplement (RESOURCE BREEZE)  1 Container Oral BID BM  . hydrALAZINE  100 mg Oral Q8H  . multivitamin  1 tablet Oral QHS  . multivitamin with minerals  1 tablet Oral Daily  . polyethylene glycol  17 g Oral BID   Continuous Infusions:  PRN Meds:.nitroGLYCERIN, oxyCODONE, psyllium  Assessment/Plan:  Hypertensive Emergency with CHF exacerbation: BP 100s-110s/60s-80s now on home meds. No peripheral edema and no crackles on lung exam. Plan to discharge on current regimen. - amlodipine 10 mg po QHS  - carvedilol 25 mg po BID  - clonidine 0.2 mg po BID  - hydralazine 100 mg po TID   AoCKD stage V: BMET stable today. S/p placement of RIJ tunneled dialysis catheter 1/23, L arm AV fistula 1/28. - HD yesterday, with outpt HD arranged; will be on Tues/Thurs/Sat schedule and patient knows the necessary steps to register. - MVI  - calcium acetate 667 mg TID  - doxercalciferol injections with HD  - appreciate nephrology recommendations and management of HD.   Anemia: Stable. Likely combination of chronic renal  failure -> underproduction of RBCs as well as iron deficiency, though recent iron panel was performed post transfusion so not particularly revealing.  - weekly darbopoetin on Fri's.   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 ALP 35, AST/ALT wnl.  - caution with hepatically metabolized med dosing.  -  will consider GI/hepatology follow up  - patient needs hepatitis B vaccination given negative Hb S Ab, on chronic hemodialysis, hx substance use - Nephrology to administer as outpatient.   LOS: 7 days   This is a Careers information officer Note.  The care of the patient was discussed with Dr. Blaine Hamper and the assessment and plan formulated with their assistance.  Please see their attached note for official documentation of the daily encounter.  Havah Ammon 09/04/2013, 9:03 AM

## 2013-09-04 NOTE — Progress Notes (Addendum)
Subjective and key labs:  -Patient tolerated AVF placement. He has minor pain over the surgical site. He feels much better. He feels that he is ready to go home today. No fever, chills or cough or SOB. - He has been set up for outpatient HD. - Cre 7.31 and K=4.1 - Hgb 8.4   Objective: Vital signs in last 24 hours: Filed Vitals:   09/03/13 2159 09/04/13 0528 09/04/13 1021 09/04/13 1451  BP: 120/77 118/81 127/77 119/71  Pulse: 84 75 75 71  Temp: 98.8 F (37.1 C) 98.1 F (36.7 C)  98.2 F (36.8 C)  TempSrc: Oral Oral  Oral  Resp: 16 14  18   Height:      Weight:      SpO2: 99% 100%  98%   Weight change:   Intake/Output Summary (Last 24 hours) at 09/04/13 2139 Last data filed at 09/04/13 1300  Gross per 24 hour  Intake    940 ml  Output      1 ml  Net    939 ml   Vitals reviewed. General: resting in bed, NAD HEENT: PERRL Cardiac: RRR, +SEM 2/6 left sternal burder  Pulm: CTA B/L b/l, -wheezing or rales Abd: soft, nontender, BS present Ext: warm and well perfused, no edema, moving all 4 extremities. Mild tender over the surgical site in the left arm. Neuro: alert and oriented X3, cranial nerves II-XII grossly intact, strength equal in bilateral upper and lower extremities  Lab Results: Basic Metabolic Panel:  Recent Labs Lab 08/29/13 0245  09/03/13 0640 09/04/13 0347  NA 142  < > 136* 134*  K 3.3*  < > 4.2 4.1  CL 102  < > 96 94*  CO2 14*  < > 19 25  GLUCOSE 97  < > 89 93  BUN 118*  < > 74* 41*  CREATININE 15.86*  < > 11.19* 7.31*  CALCIUM 8.1*  < > 8.4 7.8*  MG 2.0  --   --   --   PHOS 6.7*  < > 7.4* 6.0*  < > = values in this interval not displayed. Liver Function Tests:  Recent Labs Lab 09/03/13 0640 09/04/13 0347  ALBUMIN 2.8* 2.8*   CBC:  Recent Labs Lab 09/03/13 0640 09/04/13 0347  WBC 6.6 5.4  HGB 8.6* 8.4*  HCT 26.9* 25.5*  MCV 78.4 79.7  PLT 214 197   Cardiac Enzymes:  Recent Labs Lab 08/29/13 0245 08/29/13 0815  TROPONINI <0.30  <0.30   BNP: No results found for this basename: PROBNP,  in the last 168 hours Hemoglobin A1C: No results found for this basename: HGBA1C,  in the last 168 hours Fasting Lipid Panel: No results found for this basename: CHOL, HDL, LDLCALC, TRIG, CHOLHDL, LDLDIRECT,  in the last 168 hours Thyroid Function Tests: No results found for this basename: TSH, T4TOTAL, FREET4, T3FREE, THYROIDAB,  in the last 168 hours Coagulation:  Recent Labs Lab 08/29/13 0245  LABPROT 15.0  INR 1.21   Urine Drug Screen: Drugs of Abuse     Component Value Date/Time   LABOPIA NONE DETECTED 08/29/2013 0106   COCAINSCRNUR NONE DETECTED 08/29/2013 0106   LABBENZ NONE DETECTED 08/29/2013 0106   AMPHETMU NONE DETECTED 08/29/2013 0106   THCU NONE DETECTED 08/29/2013 0106   LABBARB NONE DETECTED 08/29/2013 0106    Urinalysis: No results found for this basename: COLORURINE, APPERANCEUR, LABSPEC, PHURINE, GLUCOSEU, HGBUR, BILIRUBINUR, KETONESUR, PROTEINUR, UROBILINOGEN, NITRITE, LEUKOCYTESUR,  in the last 168 hours Micro Results: Recent Results (from  the past 240 hour(s))  URINE CULTURE     Status: None   Collection Time    08/28/13  2:26 PM      Result Value Range Status   Specimen Description URINE, CLEAN CATCH   Final   Special Requests NONE   Final   Culture  Setup Time     Final   Value: 08/28/2013 15:51     Performed at [facility]     Final   Value: NO GROWTH     Performed at [facility]   Culture     Final   Value: NO GROWTH     Performed at [facility]   Report Status 08/29/2013 FINAL   Final  MRSA PCR SCREENING     Status: None   Collection Time    08/28/13  6:22 PM      Result Value Range Status   MRSA by PCR NEGATIVE  NEGATIVE Final   Comment:            The GeneXpert MRSA Assay (FDA     approved for NASAL specimens     only), is one component of a     comprehensive MRSA colonization     surveillance program. It is not     intended to diagnose  MRSA     infection nor to guide or     monitor treatment for     MRSA infections.   Medications: I have reviewed the patient's current medications. Scheduled Meds:  Continuous Infusions:  PRN Meds:.   Assessment/Plan: Principal Problem:   ESRD on dialysis Active Problems:   Systolic and diastolic CHF, acute on chronic   HTN (hypertension)   Hypertensive emergency   Anemia in chronic kidney disease   Hepatitis C  Mr. Koltes is a 43 year old male with uncontrolled HTN, CKD stage 5, and combined CHF admitted for hypertensive emergency.   Hypertension--well controlled with HD, on coreg, norvasc, clonidine, and hydralazine. Bp is 118/81 in AM. Patient will be d/c'ed today on following regimen: - Amlodipine 10 mg po QHS  - carvedilol 25 mg po BID  - clonidine 0.2 mg po BID  - hydralazine 100 mg po TID  ESRD on HD. S/P tunneled HD cath 1/23.  AVF placement wed.  He tolerated the procedure well.   Recent Labs Lab 08/31/13 0253 09/01/13 0500 09/02/13 0620 09/03/13 0640 09/04/13 0347  CREATININE 10.19* 12.05* 9.52* 11.19* 7.31*   - HD started 1/23, outpatient HD was set up at [facility] - HD on Tues/Thurs/Sat schedule and patient knows the necessary steps to register.  - Appreciate nephrology recommendations and management of HD.   Hepatitis C--Reactive Ab.  HIV negative. ALP 35, AST/ALT wnl. Negative for Hepatitis B surface Ag, surface Ab, core total Ab, and IgM - Patient needs hepatitis B vaccination given negative Hb S Ab, on chronic hemodialysis, hx substance use - Nephrology to administer as outpatient.  Anemia of chronic disease in setting of ESRD on HD. S/p 2 units prbc this admission on 08/29/13. No evidence of acute bleeding.  -Hb stable in 8's  Chronic diastolic and systolic heart failure--EF 40-45% with diffuse hypokinesis and grade 2 DD per echo 04/2013.   -HD and continue po BP medications as above.  Diet: Renal  DVT Ppx: Heparin  Dispo:  d/c home today.   The patient does not have a current PCP (No Pcp Per Patient) and does need an  [facility] hospital follow-up appointment after discharge.  The patient does not have transportation limitations that hinder transportation to clinic appointments.  Services Needed at time of discharge: Y = Yes, Blank = No PT:   OT:   RN:   Equipment:   Other:     LOS: 7 days   Ivor Costa, MD 09/04/2013, 9:39 PM

## 2013-09-04 NOTE — Progress Notes (Signed)
[facility] Progress Note   Subjective:   Pt feeling well, ready to go home, no questions.    Objective:   BP 118/81  Pulse 75  Temp(Src) 98.1 F (36.7 C) (Oral)  Resp 14  Ht 6' (1.829 m)  Wt 154 lb 15.7 oz (70.3 kg)  BMI 21.01 kg/m2  SpO2 100%  Intake/Output Summary (Last 24 hours) at 09/04/13 0816 Last data filed at 09/04/13 0040  Gross per 24 hour  Intake    850 ml  Output   2550 ml  Net  -1700 ml   Weight change:  -6 lbs (Dry weight of 154)   Physical Exam: GEN: well appearing, resting comfortably in bed. NAD.  HEART: RRR, II/VI systolic murmur.  CHEST: CTAB. No rales, rhonchi, or wheezing noted.  ABDOMEN: soft nontender, nondistended.  EXTREMITIES: No LE edema on exam.  Imaging: No results found.  Labs: BMET  Recent Labs Lab 08/29/13 0245 08/29/13 2147 08/30/13 0330 08/31/13 0253 09/01/13 0500 09/02/13 0620 09/03/13 0640 09/04/13 0347  NA 142 138 138 138 133* 137 136* 134*  K 3.3* 3.1* 3.2* 4.0 3.7 4.1 4.2 4.1  CL 102 98 98 99 95* 97 96 94*  CO2 14* 22 21 21 20 23 19 25   GLUCOSE 97 150* 88 94 121* 96 89 93  BUN 118* 78* 81* 67* 81* 60* 74* 41*  CREATININE 15.86* 11.52* 11.88* 10.19* 12.05* 9.52* 11.19* 7.31*  CALCIUM 8.1* 8.2* 8.1* 8.3* 8.4 8.4 8.4 7.8*  PHOS 6.7*  --  5.4* 5.2* 5.9* 6.3* 7.4* 6.0*   CBC  Recent Labs Lab 08/28/13 1253  08/31/13 0255 09/01/13 0500 09/03/13 0640 09/04/13 0347  WBC 7.0  < > 6.2 6.0 6.6 5.4  NEUTROABS 5.5  --   --   --   --   --   HGB 6.8*  < > 8.5* 8.5* 8.6* 8.4*  HCT 20.4*  < > 25.7* 25.7* 26.9* 25.5*  MCV 75.6*  < > 77.4* 78.6 78.4 79.7  PLT 205  < > 180 192 214 197  < > = values in this interval not displayed.  Medications:    . amLODipine  10 mg Oral QHS  . calcium acetate  2,001 mg Oral TID WC  . carvedilol  50 mg Oral BID WC  . cloNIDine  0.2 mg Oral BID  . darbepoetin (ARANESP) injection - DIALYSIS  200 mcg Intravenous Q Wed-HD  . docusate sodium  50 mg Oral Daily  .  doxercalciferol  4 mcg Intravenous Q M,W,F-HD  . feeding supplement (RESOURCE BREEZE)  1 Container Oral BID BM  . hydrALAZINE  100 mg Oral Q8H  . multivitamin  1 tablet Oral QHS  . multivitamin with minerals  1 tablet Oral Daily  . polyethylene glycol  17 g Oral BID      Assessment/ Plan:   Pt is a 43 y.o. male with h/o HTN, diastolic CHF (EF 123456), HCV and cirrhosis, CKD stage 4 and OSA found to be uremic secondary to ESRD with the need for HD and fistula placement.   1) Uremia secondary to ESRD  2) HTN urgency w/o acute end organ involvement.  3) Pulmonary edema 4) Anemia of renal disease  5) Failed AVF on right - S/P L Brachiocephalic AVF on [id number] by Dr. Bridgett Larsson  6) Hx of Hep C - Per primary  7) Acute on Chronic Diastolic/Systolic CHF - per primary  8) Metabolic Bone Disease - PTH 144.2, Ph 6.3.  Started on  Hectorol and PhosLo  Plan:    1) Pt to [facility] for HD on TTS 2nd shift schedule.  Will be going to center on Friday Jan 30th @ 10AM to sign paperwork, begin tx on Sat Jan 31th.  EDW of 154 lbs, 4 hr tx, 2K, 2.225 Ca bath, BFR 400, DFR 800, hectorol 4 mcg q HD, Epo 15,000 QHD, tight heparin x 1 wk then standard.   Nolon Rod, DO PGY-2, [facility] 09/04/2013, 8:16 AM I have seen and examined this patient and agree with plan . To be discharged today, plan for outpt HD as above; records FAXED to the outpt HD unit. Yamil Dougher B,MD 09/04/2013 12:47 PM

## 2013-09-04 NOTE — Progress Notes (Signed)
I have seen the patient and reviewed the daily progress note by medical student  and discussed the care of the patient with them. Please see my note for documentation of my findings, assessment, and plans  Charly Hunton, MD PGY3, Internal Medicine Teaching Service Pager: [phone]   

## 2013-09-05 ENCOUNTER — Telehealth: Payer: Self-pay | Admitting: Vascular Surgery

## 2013-09-05 NOTE — Telephone Encounter (Addendum)
Message copied by Doristine Section on Fri Sep 05, 2013  1:49 PM ------      Message from: [name] Minium K      Created: Thu Sep 04, 2013  2:30 PM      Regarding: Schedule                   ----- Message -----         From: Gabriel Earing, PA-C         Sent: 09/04/2013  12:57 PM           To: [facility] Charge Pool            S/p left BC AVF 1.28.15.  F/u with Dr. Bridgett Larsson in 6 weeks.            Thanks,      Aldona Bar  notified patient of post op visit on 10-17-13 2:30 with dr. Bridgett Larsson ------

## 2013-09-05 NOTE — Discharge Summary (Signed)
Patient Name:  Patrick Anthony  MRN: [id number]  PCP: No PCP Per Patient  DOB:  08/25/1970       Date of Admission:  08/28/2013  Date of Discharge:  09/04/2013      Attending Physician: Dr. Marinda Elk         DISCHARGE DIAGNOSES: 1.   ESRD on dialysis 2.   Systolic and diastolic CHF, acute on chronic 3.   HTN (hypertension) 4.   Hypertensive emergency 5.   Anemia in chronic kidney disease 6.   Hepatitis C 7.   Anemia  DISPOSITION AND FOLLOW-UP: [name] is to follow-up with the listed providers as detailed below, at patient's visiting, please address following issues:  1. Check his blood pressure and make adjustment of his medication accordingly. 2. Make sure patient is doing HD regularly and taking all medications 3. Evaluate his volume status  Follow-up Information   Follow up with [name]    . (Appointment for  Wed, Sep 11, 2013 at [facility])    Contact information:   [place] [address] [phone]      Please follow up. ([facility] call for list of doctors accepting new patients:: [phone])       Follow up with [name], MD In 6 weeks. (Office will call you to arrange your appt (sent))    Specialty:  Vascular Surgery   Contact information:   [address] [place] [address] [phone]      Discharge Orders   Future Appointments Provider Department Dept Phone   09/10/2013 5:00 PM [facility] Covering Provider [place] [phone]   Future Orders Complete By Expires   ([place]) Call MD:  Anytime you have any of the following symptoms: 1) 3 pound weight gain in 24 hours or 5 pounds in 1 week 2) shortness of breath, with or without a dry hacking cough 3) swelling in the hands, feet or stomach 4) if you have to sleep on extra pillows at night in order to breathe.  As directed    Call MD for:  difficulty breathing, headache or visual disturbances  As  directed    Call MD for:  extreme fatigue  As directed    Call MD for:  persistant dizziness or light-headedness  As directed    Call MD for:  persistant nausea and vomiting  As directed    Call MD for:  severe uncontrolled pain  As directed    Diet - low sodium heart healthy  As directed    Increase activity slowly  As directed        DISCHARGE MEDICATIONS:   Medication List    STOP taking these medications       calcitRIOL 0.25 MCG capsule  Commonly known as:  ROCALTROL     furosemide 40 MG tablet  Commonly known as:  LASIX     multivitamin with minerals tablet     Vitamin D (Ergocalciferol) 50000 UNITS Caps capsule  Commonly known as:  DRISDOL      TAKE these medications       amLODipine 10 MG tablet  Commonly known as:  NORVASC  Take 1 tablet (10 mg total) by mouth at bedtime.     calcium acetate 667 MG capsule  Commonly known as:  PHOSLO  Take 3 capsules (2,001 mg total) by mouth 3 (three) times daily with meals.     carvedilol 25 MG tablet  Commonly known as:  COREG  Take 2 tablets (50 mg total) by mouth 2 (two) times daily with a meal.     cloNIDine 0.2 MG tablet  Commonly known as:  CATAPRES  Take 1 tablet (0.2 mg total) by mouth 2 (two) times daily.     diphenhydrAMINE 25 mg capsule  Commonly known as:  BENADRYL  Take 1 capsule (25 mg total) by mouth every 12 (twelve) hours as needed for itching.     docusate sodium 50 MG capsule  Commonly known as:  COLACE  Take 1 capsule (50 mg total) by mouth daily.     hydrALAZINE 100 MG tablet  Commonly known as:  APRESOLINE  Take 1 tablet (100 mg total) by mouth every 8 (eight) hours.     multivitamin Tabs tablet  Take 1 tablet by mouth at bedtime.     nitroGLYCERIN 0.4 MG SL tablet  Commonly known as:  NITROSTAT  Place 0.4 mg under the tongue every 5 (five) minutes as needed for chest pain.     oxyCODONE 5 MG immediate release tablet  Commonly known as:  Oxy IR/ROXICODONE  Take 1 tablet (5 mg total)  by mouth every 6 (six) hours as needed for severe pain.     polyethylene glycol packet  Commonly known as:  MIRALAX / GLYCOLAX  Take 17 g by mouth 2 (two) times daily.       CONSULTS: Renal and Vascular surgeon  PROCEDURES PERFORMED:  Dg Chest 2 View  08/28/2013   CLINICAL DATA:  Chest pain and shortness of breath.  EXAM: CHEST  2 VIEW  COMPARISON:  PA and lateral chest 04/19/2013.  FINDINGS: There is cardiomegaly with hazy bilateral perihilar opacities. No pneumothorax or pleural fluid.  IMPRESSION: Cardiomegaly with bilateral perihilar opacities which could be due to pulmonary edema or pneumonia.   Electronically Signed   By: Inge Rise M.D.   On: 08/28/2013 13:49   Dg Chest Port 1 View  08/29/2013   CLINICAL DATA:  Dialysis catheter placement.  EXAM: PORTABLE CHEST - 1 VIEW  COMPARISON:  08/28/2013  FINDINGS: Technical malfunction resulted in incomplete imaging of the lung bases.  There has been interval placement of a right jugular dual-lumen central venous catheter with tips overlying the lower SVC. Moderate cardiomegaly remains. The lungs are well inflated. Patchy perihilar opacities have slightly improved from the prior study. There is no evidence of new airspace consolidation, large pleural effusion, or pneumothorax.  IMPRESSION: 1. Interval right jugular CVC placement as above. 2. Cardiomegaly. 3. Slight interval improvement of perihilar opacities, suggestive of improved, mild edema.   Electronically Signed   By: Logan Bores   On: 08/29/2013 15:00   Dg Fluoro Guide Cv Line-no Report  08/29/2013   CLINICAL DATA: dialysis catheter   FLOURO GUIDE CV LINE  Fluoroscopy was utilized by the requesting physician.  No radiographic  interpretation.       ADMISSION DATA:  History of Present Illness: [name] is a 43 year old African American male with PMH of HTN (uncontrolled), Hep C, CKD stage 5 (was to get HD catheter placed today), and combined systolic and diastolic CHF (EF 06-26%,  grade 2 DD) presenting to ED today with complaints of worsening SOB, headache, and chest pain x3 days.  He reports that his symptoms mainly started 3 weeks ago and is non-compliant with his BP medications (has not taken for at least 2 weeks).  He recently moved here from [place] and was set to  start HD soon.  [name] reports that overnight his symptoms got worse in regards to his shortness of breath, especially worse at night when lying flat and chest substernal/left sided chest pressure.  His pain is also associated with headache, occasional nausea but no blurry vision, fever, chills, abdominal pain, or diarrhea.  He does still make urine.  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  His chest pain is intermittent, non-radiating, and feels more like pressure, non-tender to palpation.  BP: 220's/150's in ED on admission.   Physical Exam: Blood pressure 191/121, pulse 77, temperature 98.2 F (36.8 C), temperature source Oral, resp. rate 16, height 6' (1.829 m), weight 173 lb 1 oz (78.5 kg), SpO2 99.00%. Vitals reviewed. General: resting in strecher, NAD HEENT: PERRL, EOMI Cardiac: RRR Pulm: mild b/l crackles Abd: soft, nontender, BS present Ext: warm and well perfused, +1 pitting edema b/l lower extremities Neuro: alert and oriented X3, cranial nerves II-XII grossly intact, strength equal in bilateral upper and lower extremities  Lab results: Basic Metabolic Panel:  Recent Labs   08/28/13 1253   NA  140   K  3.4*   CL  101   CO2  15*   GLUCOSE  91   BUN  114*   CREATININE  15.60*   CALCIUM  8.2*    Liver Function Tests:  Recent Labs   08/28/13 1253   AST  18   ALT  16   ALKPHOS  35*   BILITOT  0.3   PROT  7.3   ALBUMIN  3.1*    CBC:  Recent Labs   08/28/13 1253   WBC  7.0   NEUTROABS  5.5   HGB  6.8*   HCT  20.4*   MCV  75.6*   PLT  205    BNP:  Recent Labs   08/28/13 1253   PROBNP  40974.0*     Urine Drug Screen: Drugs of Abuse      Component  Value  Date/Time     LABOPIA  POSITIVE*  04/20/2013 1108     COCAINSCRNUR  NONE DETECTED  04/20/2013 1108     LABBENZ  NONE DETECTED  04/20/2013 1108     AMPHETMU  NONE DETECTED  04/20/2013 1108     THCU  NONE DETECTED  04/20/2013 1108     LABBARB  NONE DETECTED  04/20/2013 1108    Urinalysis:  Recent Labs   08/28/13 1426   COLORURINE  YELLOW   LABSPEC  1.017   PHURINE  6.0   GLUCOSEU  100*   HGBUR  SMALL*   BILIRUBINUR  NEGATIVE   KETONESUR  NEGATIVE   PROTEINUR  >300*   UROBILINOGEN  0.2   NITRITE  NEGATIVE   LEUKOCYTESUR  SMALL*    HOSPITAL COURSE:  Hypertensive emergency: The patient was admitted with BP 220s/100s with pulmonary edema and worsening renal function with creatinine 15.6, which are consistent with hypertensive emergency. The etiology is most likely secondary to medication non-compliance in setting of CKD stage 5 and combined systolic and diastolic CHF. The patient was managed with nitroglycerin drip and diuresis, with improvement of his BP. He was then transitioned to oral anti-hypertensives with good response of his blood pressure and weaned off the nitroglycerin drip. At discharge his BP was 119/71 mmHg and he was discharged home with an antihypertensive regimen including amlodipine 10 mg QHS, carvedilol 50 mg BID, clonidine 0.2  mg BID, and hydralazine 100 mg TID. He has followup appointment with her PCP on 09/11/13.   Chronic kidney disease. The patient had been planned to have an AVF placed for hemodialysis on the day of hospital admission but instead was sent to the ED because of his hypertensive urgency. 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 GFR was 4 on admission. The patient had a tunneled dialysis catheter placed in his R internal jugular vein on 1/23 and received daily hemodialysis for two days, then every other  day for a total of five episodes of hemodialysis during hospitalization. He had a L brachiocephalic arteriovenous fistula placed on 1/28 which had an audible bruit and was nontender with intact incision on the day of discharge. He was discharged with plans for hemodialysis at [facility] on 1/31. Serum creatinine at discharge was 7.31 with GFR of 10.  Hepatitis C with cirrhosis. Patient has a history of chronic HCV infection. HIV ELISA was nonreactive during his hospital stay; hepatitis panel was negative for HAV and HBV. LFTs on admission were Alk phos 35, AST 18, ALT 16. Plan was made for patient to receive hepatitis B vaccine through nephrology and to follow up for primary care with [facility].   Anemia: Hgb was 6.8 on admission, thought likely due to chronic disease with ESRD. He was transfused 2 units of blood on 1/23 and also received darbopoetin x1 with dialysis.  Post transfusion hemoglobin was 8.2 and hemoglobin on the day of discharge was 8.4.  Acute on chronic diastolic and systolic heart failure: The patient presented with crackles on physical exam, and mild pitting edema of lower extremities with reports of worsening dyspnea and orthopnea. 2D echo from 04/2013 showed EF 40-45%, diffuse hypokinesis, mild LVH, moderate concentric hypertrophy, mildly to moderately reduced systolic function, pseudonormal LV filling pattern with concomitant abnormal relaxation and increased filling pressure, grade 2 diastolic dysfunction, elevated LA filling pressure. CXR suggestive of pulmonary edema. The patient tolerated diuresis well and was euvolemic at the time of discharge. He was planned for three times a week dialysis and no home diuretics were prescribed.  DISCHARGE DATA: Vital Signs: BP 119/71  Pulse 71  Temp(Src) 98.2 F (36.8 C) (Oral)  Resp 18  Ht 6' (1.829 m)  Wt 154 lb 15.7 oz (70.3 kg)  BMI 21.01 kg/m2  SpO2 98%  Labs: No results found for this or any  previous visit (from the past 24 hour(s)).   Services Ordered on Discharge: Y = Yes; Blank = No PT:   OT:   RN:   Equipment:   Other:     Time Spent on Discharge: 35 min  Signed: Ivor Costa, MD PGY 3, Internal Medicine Resident 09/05/2013, 7:39 AM

## 2013-09-10 ENCOUNTER — Inpatient Hospital Stay: Payer: Medicaid - Out of State

## 2013-09-15 ENCOUNTER — Inpatient Hospital Stay: Payer: Medicaid Other | Admitting: Internal Medicine

## 2013-10-10 ENCOUNTER — Encounter: Payer: Self-pay | Admitting: Internal Medicine

## 2013-10-10 ENCOUNTER — Ambulatory Visit: Payer: Medicaid Other | Attending: Internal Medicine | Admitting: Internal Medicine

## 2013-10-10 VITALS — BP 145/86 | HR 76 | Temp 98.3°F | Resp 18 | Ht 78.0 in | Wt 163.0 lb

## 2013-10-10 DIAGNOSIS — B182 Chronic viral hepatitis C: Secondary | ICD-10-CM

## 2013-10-10 DIAGNOSIS — N186 End stage renal disease: Secondary | ICD-10-CM

## 2013-10-10 DIAGNOSIS — Z0000 Encounter for general adult medical examination without abnormal findings: Secondary | ICD-10-CM

## 2013-10-10 DIAGNOSIS — Z992 Dependence on renal dialysis: Secondary | ICD-10-CM

## 2013-10-10 DIAGNOSIS — I10 Essential (primary) hypertension: Secondary | ICD-10-CM

## 2013-10-10 NOTE — Progress Notes (Unsigned)
Pt here to establish care Hx.ESRDon HD T TH Sat Right chest port  Left AV fistula VSS

## 2013-10-10 NOTE — Progress Notes (Unsigned)
Patient ID: Patrick Anthony, male   DOB: 1970-09-23, 43 y.o.   MRN: [id number]   HPI: Patrick Anthony is a 43 y.o. male presenting on 10/10/2013 with PMD of ESRD new start on dialysis, HTN, systolic and diastolic CHF presenting to establish care. No complaints.     Past Medical History  Diagnosis Date  . Hypertension   . CHF (congestive heart failure)   . Renal disorder renal failure  . Cirrhosis of liver   . Drug addiction   . ETOH abuse   . Sleep apnea   . Hepatitis     Past Surgical History  Procedure Laterality Date  . Av fistula placement Right   . Insertion of dialysis catheter N/A 08/29/2013    Procedure: INSERTION OF DIALYSIS CATHETER;  Surgeon: Mal Misty, MD;  Location: [facility];  Service: Vascular;  Laterality: N/A;  . Av fistula placement Left 09/03/2013    Procedure: ARTERIOVENOUS (AV) FISTULA CREATION - LEFT BRACHIOCEPHALIC;  Surgeon: Conrad Bladensburg, MD;  Location: [facility];  Service: Vascular;  Laterality: Left;    Current Outpatient Prescriptions  Medication Sig Dispense Refill  . amLODipine (NORVASC) 10 MG tablet Take 1 tablet (10 mg total) by mouth at bedtime.  30 tablet  5  . calcium acetate (PHOSLO) 667 MG capsule Take 3 capsules (2,001 mg total) by mouth 3 (three) times daily with meals.  270 capsule  5  . carvedilol (COREG) 25 MG tablet Take 2 tablets (50 mg total) by mouth 2 (two) times daily with a meal.  120 tablet  5  . hydrALAZINE (APRESOLINE) 100 MG tablet Take 1 tablet (100 mg total) by mouth every 8 (eight) hours.  90 tablet  4  . multivitamin (RENA-VIT) TABS tablet Take 1 tablet by mouth at bedtime.  30 each  5  . diphenhydrAMINE (BENADRYL) 25 mg capsule Take 1 capsule (25 mg total) by mouth every 12 (twelve) hours as needed for itching.  30 capsule  0  . nitroGLYCERIN (NITROSTAT) 0.4 MG SL tablet Place 0.4 mg under the tongue every 5 (five) minutes as needed for chest pain.       No current facility-administered medications for this visit.    No Known  Allergies  History reviewed. No pertinent family history.  History   Social History  . Marital Status: Single    Spouse Name: N/A    Number of Children: N/A  . Years of Education: N/A   Occupational History  . Not on file.   Social History Main Topics  . Smoking status: Never Smoker   . Smokeless tobacco: Not on file  . Alcohol Use: Yes  . Drug Use: No     Comment: Quit  . Sexual Activity: Not on file   Other Topics Concern  . Not on file   Social History Narrative  . No narrative on file    Review of Systems  Review of Systems  Constitutional: Negative for fever, chills, diaphoresis, activity change, appetite change and fatigue.  HENT: Negative for ear pain, nosebleeds, congestion, facial swelling, rhinorrhea, neck pain, neck stiffness and ear discharge.  Eyes: Negative for pain, discharge, redness, itching and visual disturbance.  Respiratory: Negative for cough, choking, chest tightness, shortness of breath, wheezing and stridor.  Cardiovascular: Negative for chest pain, palpitations and leg swelling.  Gastrointestinal: Negative for abdominal distention, vomiting, diarrhea or consitpation Genitourinary: Negative for dysuria, urgency, frequency, hematuria, flank pain, decreased urine volume, difficulty urinating and dyspareunia.  Musculoskeletal: Negative for back pain,  joint swelling, arthralgias or gait problem.  Neurological: Negative for dizziness, tremors, seizures, syncope, facial asymmetry, speech difficulty, weakness, light-headedness, numbness and headaches.  Hematological: Negative for adenopathy. Does not bruise/bleed easily.  Psychiatric/Behavioral: Negative for hallucinations, behavioral problems, confusion, dysphoric mood   Objective:  BP 145/86  Pulse 76  Temp(Src) 98.3 F (36.8 C) (Oral)  Resp 18  Ht 6\' 6"  (1.981 m)  Wt 163 lb (73.936 kg)  BMI 18.84 kg/m2  SpO2 99% Filed Weights   10/10/13 1204  Weight: 163 lb (73.936 kg)     Physical  Exam  Constitutional: Appears well-developed and well-nourished. No distress. HENT: Normocephalic. External right and left ear normal. Oropharynx is clear and moist.  CHEST- dialysis cath in right upper chest- subclavian Eyes: Conjunctivae and EOM are normal. PERRLA, no scleral icterus.  Neck: Normal ROM. Neck supple. No JVD. No tracheal deviation. No thyromegaly.  CVS: RRR, S1/S2 +, no murmurs, no gallops, no carotid bruit.  Pulmonary: Effort and breath sounds normal, no stridor, rhonchi, wheezes, rales.  Abdominal: Soft. BS +,  no distension, tenderness, rebound or guarding.  Musculoskeletal: Normal range of motion. No edema and no tenderness.  Neuro: Alert. Normal reflexes, muscle tone coordination. No cranial nerve deficit. Skin: Skin is warm and dry. No rash noted. Not diaphoretic. No erythema. No pallor.  Psychiatric: Normal mood and affect. Behavior, judgment, thought content normal.  Extremities- Fistula in left upper arm  Lab Results  Component Value Date   WBC 5.4 09/04/2013   HGB 8.4* 09/04/2013   HCT 25.5* 09/04/2013   MCV 79.7 09/04/2013   PLT 197 09/04/2013   Lab Results  Component Value Date   CREATININE 7.31* 09/04/2013   BUN 41* 09/04/2013   NA 134* 09/04/2013   K 4.1 09/04/2013   CL 94* 09/04/2013   CO2 25 09/04/2013    Lab Results  Component Value Date   HGBA1C 4.6 08/28/2013   Lipid Panel     Component Value Date/Time   CHOL 165 08/28/2013 2107   TRIG 126 08/28/2013 2107   HDL 32* 08/28/2013 2107   CHOLHDL 5.2 08/28/2013 2107   VLDL 25 08/28/2013 2107   LDLCALC 108* 08/28/2013 2107        Patient Active Problem List   Diagnosis Date Noted  . Anemia in chronic kidney disease 08/30/2013  . Hepatitis C 08/30/2013  . ESRD on dialysis 08/30/2013  . Hypertensive crisis 08/28/2013  . Hypertensive emergency 04/20/2013  . Systolic and diastolic CHF, acute on chronic 04/19/2013  . HTN (hypertension) 04/19/2013     Preventative Medicine:  No health maintenance  topics applied.  No health maintenance topics applied.  LAB WORK: recently completed in hospital  Metabolic panel: CBC:  Vitamin D : Lipid Panel: TSH: PSA:    Assessment and plan: Routine history and physical examination of adult  ESRD on dialysis  HTN (hypertension)  Hep C w/o coma, chronic  Chronic systolic and diastolic CHF  ALL medical problems stable- will need dialysis cath out once fistula mature-    Return in about 6 months (around 04/12/2014).   The patient was given clear instructions to go to ER or return to medical center if symptoms don't improve, worsen or new problems develop. The patient verbalized understanding. The patient was told to call to get lab results if they haven't heard anything in the next week.     Debbe Odea, MD

## 2013-10-16 ENCOUNTER — Encounter: Payer: Self-pay | Admitting: Vascular Surgery

## 2013-10-17 ENCOUNTER — Other Ambulatory Visit (HOSPITAL_COMMUNITY): Payer: Medicaid Other

## 2013-10-17 ENCOUNTER — Ambulatory Visit (HOSPITAL_COMMUNITY)
Admission: RE | Admit: 2013-10-17 | Discharge: 2013-10-17 | Disposition: A | Discharge status: Home / Self Care | Payer: Medicaid Other | Source: Ambulatory Visit | Attending: Vascular Surgery | Admitting: Vascular Surgery

## 2013-10-17 ENCOUNTER — Ambulatory Visit (INDEPENDENT_AMBULATORY_CARE_PROVIDER_SITE_OTHER): Payer: Medicaid Other | Admitting: Vascular Surgery

## 2013-10-17 ENCOUNTER — Encounter: Payer: Self-pay | Admitting: Vascular Surgery

## 2013-10-17 VITALS — BP 123/87 | HR 78 | Resp 18 | Ht 72.0 in | Wt 163.0 lb

## 2013-10-17 DIAGNOSIS — N186 End stage renal disease: Secondary | ICD-10-CM | POA: Insufficient documentation

## 2013-10-17 DIAGNOSIS — Z4931 Encounter for adequacy testing for hemodialysis: Secondary | ICD-10-CM | POA: Insufficient documentation

## 2013-10-17 NOTE — Progress Notes (Signed)
    Postoperative Access Visit   History of Present Illness  Tiran Kochel is a 43 y.o. year old male who presents for postoperative follow-up for: L BC AVF (Date: 09/03/13).  The patient's wounds are  healed.  The patient notes no steal symptoms.  The patient is able to complete their activities of daily living.  The patient's current symptoms are:  None.  Pt dialyzes from a RIJ TDC.  For VQI Use Only  PRE-ADM LIVING: Home  AMB STATUS: Ambulatory  Physical Examination Filed Vitals:   10/17/13 1533  BP: 123/87  Pulse: 78  Resp: 18    LUE: Incision is healed, skin feels warm, hand grip is 5/5, sensation in digits is  intact, palpable thrill, bruit can  be auscultated , visible side branches  L arm access duplex (10/17/2013)  Diameters: 3.3-6.5 mm  Depth: 3.3 - 3.6 mm   Medical Decision Making  Edmund Whelihan is a 43 y.o. year old male who presents s/p L BC AVF.  L BC AVF is not quite ready for use.  Would give it another month and recheck.  I suspect this fistula can be salvaged with side branch ligation, if its not matured in one month.  Thank you for allowing [place] to participate in this patient's care.  Adele Barthel, MD [facility] Office: [phone] Pager: [phone]  10/17/2013, 4:21 PM

## 2013-11-20 ENCOUNTER — Encounter: Payer: Self-pay | Admitting: Vascular Surgery

## 2013-11-21 ENCOUNTER — Ambulatory Visit: Payer: Medicaid Other | Admitting: Vascular Surgery

## 2013-12-18 ENCOUNTER — Encounter: Payer: Self-pay | Admitting: Vascular Surgery

## 2013-12-19 ENCOUNTER — Ambulatory Visit: Payer: Medicaid Other | Admitting: Vascular Surgery

## 2014-01-01 ENCOUNTER — Encounter: Payer: Self-pay | Admitting: Vascular Surgery

## 2014-01-02 ENCOUNTER — Ambulatory Visit (INDEPENDENT_AMBULATORY_CARE_PROVIDER_SITE_OTHER): Payer: Self-pay | Admitting: Vascular Surgery

## 2014-01-02 ENCOUNTER — Encounter: Payer: Self-pay | Admitting: Vascular Surgery

## 2014-01-02 VITALS — BP 104/79 | HR 82 | Ht 72.0 in | Wt 170.0 lb

## 2014-01-02 DIAGNOSIS — N186 End stage renal disease: Secondary | ICD-10-CM

## 2014-01-02 NOTE — Progress Notes (Signed)
   Postoperative Access Visit   History of Present Illness  Patrick Anthony is a 44 y.o. year old male who presents for postoperative follow-up for: L BC AVF (Date: 09/03/13). The patient's wounds are healed. The patient notes no steal symptoms. The patient is able to complete their activities of daily living. The patient's current symptoms are: none.  Pt dialyzes from a RIJ TDC.   For VQI Use Only  PRE-ADM LIVING: Home  AMB STATUS: Ambulatory    Physical Examination  Filed Vitals:   01/02/14 0936  BP: 104/79  Pulse: 82  Height: 6' (1.829 m)  Weight: 170 lb (77.111 kg)  SpO2: 99%   LUE: Incision is healed, skin feels warm, hand grip is 5/5, sensation in digits is intact, palpable thrill, bruit can be auscultated , visible side branches , visible fistula at this point, on sonosite: > 5 mm throughout  Patrick Anthony is a 43 y.o. year old male who presents s/p L BC AVF.   I think its worth going ahead an trying to cannulate this fistula.  It's already grown since last month. If the fistula can't be cannulated then I would schedule a L BC AVF side-branch ligation. Thank you for allowing [place] to participate in this patient's care.  Adele Barthel, MD [facility] Office: [phone] Pager: [phone]  01/02/2014, 9:52 AM

## 2014-02-13 ENCOUNTER — Encounter (HOSPITAL_COMMUNITY)
Admission: EM | Disposition: A | Discharge status: Home / Self Care | Payer: Self-pay | Source: Home / Self Care | Attending: Emergency Medicine

## 2014-02-13 ENCOUNTER — Emergency Department (HOSPITAL_COMMUNITY)
Admission: EM | Admit: 2014-02-13 | Discharge: 2014-02-13 | Disposition: A | Discharge status: Home / Self Care | Payer: Medicaid Other | Attending: Emergency Medicine | Admitting: Emergency Medicine

## 2014-02-13 ENCOUNTER — Encounter (HOSPITAL_COMMUNITY): Payer: Self-pay | Admitting: Emergency Medicine

## 2014-02-13 DIAGNOSIS — Y832 Surgical operation with anastomosis, bypass or graft as the cause of abnormal reaction of the patient, or of later complication, without mention of misadventure at the time of the procedure: Secondary | ICD-10-CM | POA: Insufficient documentation

## 2014-02-13 DIAGNOSIS — T82898A Other specified complication of vascular prosthetic devices, implants and grafts, initial encounter: Secondary | ICD-10-CM

## 2014-02-13 DIAGNOSIS — I120 Hypertensive chronic kidney disease with stage 5 chronic kidney disease or end stage renal disease: Secondary | ICD-10-CM | POA: Diagnosis not present

## 2014-02-13 DIAGNOSIS — Z992 Dependence on renal dialysis: Secondary | ICD-10-CM | POA: Insufficient documentation

## 2014-02-13 DIAGNOSIS — I5043 Acute on chronic combined systolic (congestive) and diastolic (congestive) heart failure: Secondary | ICD-10-CM | POA: Insufficient documentation

## 2014-02-13 DIAGNOSIS — T82598A Other mechanical complication of other cardiac and vascular devices and implants, initial encounter: Secondary | ICD-10-CM | POA: Insufficient documentation

## 2014-02-13 DIAGNOSIS — Z8719 Personal history of other diseases of the digestive system: Secondary | ICD-10-CM | POA: Insufficient documentation

## 2014-02-13 DIAGNOSIS — Z79899 Other long term (current) drug therapy: Secondary | ICD-10-CM | POA: Diagnosis not present

## 2014-02-13 DIAGNOSIS — N186 End stage renal disease: Secondary | ICD-10-CM | POA: Insufficient documentation

## 2014-02-13 LAB — BASIC METABOLIC PANEL
Anion gap: 18 — ABNORMAL HIGH (ref 5–15)
BUN: 81 mg/dL — ABNORMAL HIGH (ref 6–23)
CO2: 26 mEq/L (ref 19–32)
Calcium: 7.9 mg/dL — ABNORMAL LOW (ref 8.4–10.5)
Chloride: 97 mEq/L (ref 96–112)
Creatinine, Ser: 12.66 mg/dL — ABNORMAL HIGH (ref 0.50–1.35)
GFR calc Af Amer: 5 mL/min — ABNORMAL LOW (ref >90)
GFR calc non Af Amer: 4 mL/min — ABNORMAL LOW (ref >90)
Glucose, Bld: 108 mg/dL — ABNORMAL HIGH (ref 70–99)
Potassium: 3.5 mEq/L — ABNORMAL LOW (ref 3.7–5.3)
Sodium: 141 mEq/L (ref 137–147)

## 2014-02-13 LAB — TYPE AND SCREEN
ABO/RH(D): O POS
Antibody Screen: NEGATIVE

## 2014-02-13 LAB — CBC WITH DIFFERENTIAL/PLATELET
Basophils Absolute: 0 10*3/uL (ref 0.0–0.1)
Basophils Relative: 0 % (ref 0–1)
Eosinophils Absolute: 0.2 10*3/uL (ref 0.0–0.7)
Eosinophils Relative: 8 % — ABNORMAL HIGH (ref 0–5)
HCT: 24.3 % — ABNORMAL LOW (ref 39.0–52.0)
Hemoglobin: 8.1 g/dL — ABNORMAL LOW (ref 13.0–17.0)
Lymphocytes Relative: 30 % (ref 12–46)
Lymphs Abs: 0.9 10*3/uL (ref 0.7–4.0)
MCH: 29.9 pg (ref 26.0–34.0)
MCHC: 33.3 g/dL (ref 30.0–36.0)
MCV: 89.7 fL (ref 78.0–100.0)
Monocytes Absolute: 0.3 10*3/uL (ref 0.1–1.0)
Monocytes Relative: 9 % (ref 3–12)
Neutro Abs: 1.6 10*3/uL — ABNORMAL LOW (ref 1.7–7.7)
Neutrophils Relative %: 53 % (ref 43–77)
Platelets: 115 10*3/uL — ABNORMAL LOW (ref 150–400)
RBC: 2.71 MIL/uL — ABNORMAL LOW (ref 4.22–5.81)
RDW: 15.3 % (ref 11.5–15.5)
WBC: 2.9 10*3/uL — ABNORMAL LOW (ref 4.0–10.5)

## 2014-02-13 LAB — I-STAT CG4 LACTIC ACID, ED: Lactic Acid, Venous: 0.81 mmol/L (ref 0.5–2.2)

## 2014-02-13 SURGERY — REVISON OF ARTERIOVENOUS FISTULA
Anesthesia: General | Laterality: Left

## 2014-02-13 MED ORDER — OXYCODONE-ACETAMINOPHEN 5-325 MG PO TABS: 2.0000 | ORAL_TABLET | ORAL | Status: DC | PRN

## 2014-02-13 MED ORDER — ONDANSETRON HCL 4 MG/2ML IJ SOLN
4.0000 mg | Freq: Once | INTRAMUSCULAR | Status: AC
  Administered 2014-02-13: 4 mg via INTRAVENOUS
  Filled 2014-02-13: qty 2

## 2014-02-13 MED ORDER — HYDROMORPHONE HCL PF 1 MG/ML IJ SOLN
1.0000 mg | Freq: Once | INTRAMUSCULAR | Status: AC
  Administered 2014-02-13: 1 mg via INTRAVENOUS
  Filled 2014-02-13: qty 1

## 2014-02-13 NOTE — ED Provider Notes (Signed)
L arm circumference 35 cm.  Improved from 40 cm on arrival.  Per plan of Dr. Betsey Holiday and Dr. Kellie Simmering, patient stable for discharge.  +2 radial pulse, cardinal hand movements intact.  BP 155/107  Pulse 75  Resp 17  Ht 6' (1.829 m)  Wt 170 lb (77.111 kg)  BMI 23.05 kg/m2  SpO2 100%   Ezequiel Essex, MD 02/13/14 1810

## 2014-02-13 NOTE — Discharge Instructions (Signed)
Dialysis Vascular Access Malfunction A vascular access is an entrance to your blood vessels that can be used for dialysis. A vascular access can be made in one of several ways:   Joining an artery to a vein under your skin to make a bigger blood vessel called a fistula.   Joining an artery to a vein under your skin using a soft tube called a graft.   Placing a thin, flexible tube (catheter) in a large vein, usually in your neck.  A vascular access may malfunction or become blocked.  WHAT CAN CAUSE YOUR VASCULAR ACCESS TO MALFUNCTION?  Infection (common).   A blood clot inside a part of the fistula, graft, or catheter. A blood clot can completely or partially block the flow of blood.   A kink in the graft or catheter.   A collection of blood (called a hematoma or bruise) next to the graft or catheter that pushes against it, blocking the flow of blood.  WHAT ARE SIGNS AND SYMPTOMS OF VASCULAR ACCESS MALFUNCTION?  There is a change in the vibration or pulse of your fistula or graft.  The vibration or pulse of your fistula or graft is gone.   There is new or unusual swelling of the area around the access.   There was an unsuccessful puncture of your access by the dialysis team.   The flow of blood through the fistula, graft, or catheter is too slow for effective dialysis.   When routine dialysis is completed and the needle is removed, bleeding lasts for too long a time.  WHAT HAPPENS IF MY VASCULAR ACCESS MALFUNCTIONS? Your health care provider may order blood work, cultures, or an X-ray test in order to learn what may be wrong with your vascular access. The X-ray test involves the injection of a liquid into the vascular access. The liquid shows up on the X-ray and allows your health care provider to see if there is a blockage in the vascular access.  Treatment varies depending on the cause of the malfunction:   If the vascular access is infected, your health care provider  may prescribe antibiotics to control the infection.   If a clot is found in the vascular access, you may need surgery to remove the clot.   If a blockage in the vascular access is due to some other cause (such as a kink in a graft), then you will likely need surgery to unblock or replace the graft.  HOME CARE INSTRUCTIONS: Follow up with your surgeon or other health care provider if you were instructed to do so. This is very important. Any delay in follow up could cause permanent dysfunction of the vascular access, which may be dangerous.  SEEK MEDICAL CARE IF:   Fever develops.   Swelling and pain around the vascular access gets worse or new pain develops.  Pain, numbness, or an unusual pale skin color develops in the hand on the side of your vascular access. SEEK IMMEDIATE MEDICAL CARE IF: Unusual bleeding develops at the location of the vascular access. MAKE SURE YOU:  Understand these instructions.  Will watch your condition.  Will get help right away if you are not doing well or get worse. Document Released: 06/26/2006 Document Revised: 03/26/2013 Document Reviewed: 12/26/2012 [facility] Patient Information 2015 [place], [place]. This information is not intended to replace advice given to you by your health care provider. Make sure you discuss any questions you have with your health care provider.

## 2014-02-13 NOTE — Consult Note (Signed)
Vascular and [place]  Reason for Consult:  Bleeding fistula Referring Physician:  ER MRN #:  [id number]  History of Present Illness: This is a 43 y.o. male who is s/p left brachiocephalic AVF on [id number] by Dr. Bridgett Larsson.  The HD center has been sticking his fistula for one week.  Pt states they have been getting good flow through the smaller needles, but went with a bigger needle today.  His fistula started bleeding and he did not get his HD today.  He did start having arm swelling and was sent to the ED.  He states that his left arm is painful and swollen. He does have a diatek catheter in his right IJ.   [facility] is consulted.  Past Medical History  Diagnosis Date  . Hypertension   . CHF (congestive heart failure)   . Renal disorder renal failure  . Cirrhosis of liver   . Drug addiction   . ETOH abuse   . Sleep apnea   . Hepatitis   . Systolic and diastolic CHF, acute on chronic 04/19/2013   Past Surgical History  Procedure Laterality Date  . Av fistula placement Right   . Insertion of dialysis catheter N/A 08/29/2013    Procedure: INSERTION OF DIALYSIS CATHETER;  Surgeon: Mal Misty, MD;  Location: [place];  Service: Vascular;  Laterality: N/A;  . Av fistula placement Left 09/03/2013    Procedure: ARTERIOVENOUS (AV) FISTULA CREATION - LEFT BRACHIOCEPHALIC;  Surgeon: Conrad Woodville, MD;  Location: [place];  Service: Vascular;  Laterality: Left;    No Known Allergies  Prior to Admission medications   Medication Sig Start Date End Date Taking? Authorizing Provider  amLODipine (NORVASC) 10 MG tablet Take 1 tablet (10 mg total) by mouth at bedtime. 09/04/13  Yes Ivor Costa, MD  aspirin EC 81 MG tablet Take 81 mg by mouth daily.   Yes Historical Provider, MD  calcium acetate (PHOSLO) 667 MG capsule Take 3 capsules (2,001 mg total) by mouth 3 (three) times daily with meals. 09/04/13  Yes Ivor Costa, MD  carvedilol (COREG) 25 MG tablet Take 2 tablets (50 mg total) by mouth 2  (two) times daily with a meal. 09/04/13  Yes Ivor Costa, MD  hydrALAZINE (APRESOLINE) 100 MG tablet Take 1 tablet (100 mg total) by mouth every 8 (eight) hours. 09/04/13  Yes Ivor Costa, MD  nitroGLYCERIN (NITROSTAT) 0.4 MG SL tablet Place 0.4 mg under the tongue every 5 (five) minutes as needed for chest pain.    Historical Provider, MD    History   Social History  . Marital Status: Single    Spouse Name: N/A    Number of Children: N/A  . Years of Education: N/A   Occupational History  . Not on file.   Social History Main Topics  . Smoking status: Never Smoker   . Smokeless tobacco: Never Used  . Alcohol Use: No  . Drug Use: No     Comment: Quit  . Sexual Activity: Not on file   Other Topics Concern  . Not on file   Social History Narrative  . No narrative on file     Family History  Problem Relation Age of Onset  . Hypertension Mother   . Cancer Mother     breast    ROS: [x]  Positive   [ ]  Negative   [ ]  All sytems reviewed and are negative  Cardiovascular: []  chest pain/pressure [x]  hx CHF []  palpitations []  SOB  lying flat []  DOE []  pain in legs while walking []  pain in legs at rest []  pain in legs at night []  non-healing ulcers []  hx of DVT [x]  swelling in left arm  Pulmonary: []  productive cough []  asthma/wheezing []  home O2  Neurologic: []  weakness in []  arms []  legs []  numbness in []  arms []  legs []  hx of CVA []  mini stroke [] difficulty speaking or slurred speech []  temporary loss of vision in one eye []  dizziness  Hematologic: []  hx of cancer [x]  bleeding fistula []  problems with blood clotting easily  Endocrine:   []  diabetes []  thyroid disease  GI []  vomiting blood []  blood in stool [x]  hx hepatitis   GU: [x]  CKD/renal failure [x]  HD--[x]  M/W/F or []  T/T/S []  burning with urination []  blood in urine  Psychiatric: []  anxiety []  depression [x]  hx drug addiction and etoh use  Musculoskeletal: []  arthritis []  joint  pain  Integumentary: []  rashes []  ulcers  Constitutional: []  fever []  chills   Physical Examination  Filed Vitals:   02/13/14 1408  BP: 149/102  Pulse: 83  Resp: 20   Body mass index is 23.05 kg/(m^2).  General:  WDWN in NAD Gait: Not observed HENT: WNL, normocephalic Pulmonary: normal non-labored breathing, without Rales, rhonchi,  wheezing Cardiac: regular Skin: without rashes, without ulcers  Vascular Exam/Pulses: + palpable left radial pulse; + thrill within fistula; LUE measured 40cm at greatest diameter;  RUE measures 32cm; distal fistula with tiny arterial bleed Extremities: without ischemic changes, without Gangrene , without cellulitis; without open wounds;  Musculoskeletal: no muscle wasting or atrophy  Neurologic: A&O X 3; Appropriate Affect ; SENSATION: normal; MOTOR FUNCTION:  moving all extremities equally. Speech is fluent/normal   CBC    Component Value Date/Time   WBC 5.4 09/04/2013 0347   RBC 3.20* 09/04/2013 0347   HGB 8.4* 09/04/2013 0347   HCT 25.5* 09/04/2013 0347   PLT 197 09/04/2013 0347   MCV 79.7 09/04/2013 0347   MCH 26.3 09/04/2013 0347   MCHC 32.9 09/04/2013 0347   RDW 17.7* 09/04/2013 0347   LYMPHSABS 0.8 08/28/2013 1253   MONOABS 0.6 08/28/2013 1253   EOSABS 0.1 08/28/2013 1253   BASOSABS 0.0 08/28/2013 1253    BMET    Component Value Date/Time   NA 134* 09/04/2013 0347   K 4.1 09/04/2013 0347   CL 94* 09/04/2013 0347   CO2 25 09/04/2013 0347   GLUCOSE 93 09/04/2013 0347   BUN 41* 09/04/2013 0347   CREATININE 7.31* 09/04/2013 0347   CALCIUM 7.8* 09/04/2013 0347   GFRNONAA 8* 09/04/2013 0347   GFRAA 10* 09/04/2013 0347    COAGS: Lab Results  Component Value Date   INR 1.21 08/29/2013   INR 1.08 04/20/2013     Non-Invasive Vascular Imaging:  none  Statin:  No. Beta Blocker:  Yes.   Aspirin:  Yes.   ACEI:  No. ARB:  No. Other antiplatelets/anticoagulants:  No.   ASSESSMENT/PLAN: This is a 43 y.o. male with a bleeding left  brachiocephalic AVF and significant swelling of the LUE.  -pressure was held and bleeding stopped. -arm was measured at 40cm at it greatest diameter (line marked on skin to measure in one hour). -arm wrapped by Dr. Kellie Simmering with ACE wrap and elevated on pillows.  Will re-measure diameter in one hour.  If the diameter is not any larger, will not take pt to OR as most of the blood has dispersed throughout the tissues and will have to reabsorb  over time.   -His fistula will need 2-3 months of rest and he will have to be dialyzed via his diatek catheter until then.   Leontine Locket, PA-C [facility] [phone]  Agree

## 2014-02-13 NOTE — ED Notes (Signed)
Pt refused wheelchair.  

## 2014-02-13 NOTE — ED Provider Notes (Addendum)
CSN: [id number]     Arrival date & time 02/13/14  1352 History   First MD Initiated Contact with Patient 02/13/14 1355     Chief Complaint  Patient presents with  . Vascular Access Problem     (Consider location/radiation/quality/duration/timing/severity/associated sxs/prior Treatment) HPI Comments: Patient presents to the ER for evaluation of bleeding and swelling at the dialysis site. Patient has an AV fistula in the left upper arm. Approximately one hour into his dialysis session today he started to have pain and swelling around the needle site. Dialysis was stopped for presumed infiltration. Since then, however, patient has had some leaking of blood externally, but progressive worsening of swelling of the upper arm. He is having severe pain at the site no numbness, tingling and "pins and needles" in the hand and fingers.   Past Medical History  Diagnosis Date  . Hypertension   . CHF (congestive heart failure)   . Renal disorder renal failure  . Cirrhosis of liver   . Drug addiction   . ETOH abuse   . Sleep apnea   . Hepatitis   . Systolic and diastolic CHF, acute on chronic 04/19/2013   Past Surgical History  Procedure Laterality Date  . Av fistula placement Right   . Insertion of dialysis catheter N/A 08/29/2013    Procedure: INSERTION OF DIALYSIS CATHETER;  Surgeon: Mal Misty, MD;  Location: [place];  Service: Vascular;  Laterality: N/A;  . Av fistula placement Left 09/03/2013    Procedure: ARTERIOVENOUS (AV) FISTULA CREATION - LEFT BRACHIOCEPHALIC;  Surgeon: Conrad Beallsville, MD;  Location: [facility] OR;  Service: Vascular;  Laterality: Left;   Family History  Problem Relation Age of Onset  . Hypertension Mother   . Cancer Mother     breast   History  Substance Use Topics  . Smoking status: Never Smoker   . Smokeless tobacco: Never Used  . Alcohol Use: No    Review of Systems  Skin: Positive for wound.  All other systems reviewed and are negative.     Allergies    Review of patient's allergies indicates no known allergies.  Home Medications   Prior to Admission medications   Medication Sig Start Date End Date Taking? Authorizing Provider  amLODipine (NORVASC) 10 MG tablet Take 1 tablet (10 mg total) by mouth at bedtime. 09/04/13  Yes Ivor Costa, MD  aspirin EC 81 MG tablet Take 81 mg by mouth daily.   Yes Historical Provider, MD  calcium acetate (PHOSLO) 667 MG capsule Take 3 capsules (2,001 mg total) by mouth 3 (three) times daily with meals. 09/04/13  Yes Ivor Costa, MD  carvedilol (COREG) 25 MG tablet Take 2 tablets (50 mg total) by mouth 2 (two) times daily with a meal. 09/04/13  Yes Ivor Costa, MD  hydrALAZINE (APRESOLINE) 100 MG tablet Take 1 tablet (100 mg total) by mouth every 8 (eight) hours. 09/04/13  Yes Ivor Costa, MD  nitroGLYCERIN (NITROSTAT) 0.4 MG SL tablet Place 0.4 mg under the tongue every 5 (five) minutes as needed for chest pain.    Historical Provider, MD   BP 148/103  Pulse 72  Resp 11  Ht 6' (1.829 m)  Wt 170 lb (77.111 kg)  BMI 23.05 kg/m2  SpO2 98% Physical Exam  Constitutional: He is oriented to person, place, and time. He appears well-developed and well-nourished. No distress.  HENT:  Head: Normocephalic and atraumatic.  Right Ear: Hearing normal.  Left Ear: Hearing normal.  Nose: Nose normal.  Mouth/Throat: Oropharynx is clear and moist and mucous membranes are normal.  Eyes: Conjunctivae and EOM are normal. Pupils are equal, round, and reactive to light.  Neck: Normal range of motion. Neck supple.  Cardiovascular: Regular rhythm, S1 normal and S2 normal.  Exam reveals no gallop and no friction rub.   No murmur heard. Pulmonary/Chest: Effort normal and breath sounds normal. No respiratory distress. He exhibits no tenderness.  Abdominal: Soft. Normal appearance and bowel sounds are normal. There is no hepatosplenomegaly. There is no tenderness. There is no rebound, no guarding, no tenderness at McBurney's point and  negative Murphy's sign. No hernia.  Musculoskeletal: Normal range of motion.       Arms: Neurological: He is alert and oriented to person, place, and time. He has normal strength. No cranial nerve deficit or sensory deficit. Coordination normal. GCS eye subscore is 4. GCS verbal subscore is 5. GCS motor subscore is 6.  Skin: Skin is warm, dry and intact. No rash noted. No cyanosis.  Psychiatric: He has a normal mood and affect. His speech is normal and behavior is normal. Thought content normal.    ED Course  Procedures (including critical care time) Labs Review Labs Reviewed  CBC WITH DIFFERENTIAL - Abnormal; Notable for the following:    WBC 2.9 (*)    RBC 2.71 (*)    Hemoglobin 8.1 (*)    HCT 24.3 (*)    Platelets 115 (*)    Neutro Abs 1.6 (*)    Eosinophils Relative 8 (*)    All other components within normal limits  BASIC METABOLIC PANEL - Abnormal; Notable for the following:    Potassium 3.5 (*)    Glucose, Bld 108 (*)    BUN 81 (*)    Creatinine, Ser 12.66 (*)    Calcium 7.9 (*)    GFR calc non Af Amer 4 (*)    GFR calc Af Amer 5 (*)    Anion gap 18 (*)    All other components within normal limits  I-STAT CG4 LACTIC ACID, ED  TYPE AND SCREEN    Imaging Review No results found.   EKG Interpretation None      MDM   Final diagnoses:  None   AV fistula graft problem   She presents with severe pain and swelling of the upper arm after these AV fistula was accessed for dialysis. He has some slight oozing of blood, but there is concern for ongoing hematoma formation. Patient arrived with pressure on the site, this was continued. Vascular surgery was immediately consulted upon arrival for presumed vascular injury with ongoing hematoma formation. Doctor Junious Silk feels that this can be managed conservatively. He wrapped the arm and is good. Patient was observed for a number of hours here in the ER. Doctor Harrington Challenger and reevaluated the patient and felt that he could be  discharged, arrange for dialysis tomorrow.    Orpah Greek, MD 02/13/14 1511  Orpah Greek, MD 02/13/14 [phone]

## 2014-02-13 NOTE — ED Notes (Signed)
PT L arm circumference now 36.25cm down from 40cm

## 2014-02-13 NOTE — ED Notes (Signed)
PT arm measured 40cm at 1438. remeasure in 1hr

## 2014-02-13 NOTE — Consult Note (Addendum)
Vascular Surgery Consultation  Reason for Consult: Bleeding from left brachial to cephalic A-V fistula  HPI: Patrick Anthony is a 43 y.o. male who presents for evaluation of bleeding from left brachial to cephalic AV fistula. Patient had left brachial to cephalic AV fistula created by Dr. Bridgett Larsson 09/03/2013. Only recently has patient started on hemodialysis. The fistula has been utilized about 3-4 times. Today he after they tried to upsize the needle he developed infiltration in enlargement of the left upper arm. He was sent to the emergency department. Initially there was some exterior bleeding from a distal needle hole in the upper arm.   Past Medical History  Diagnosis Date  . Hypertension   . CHF (congestive heart failure)   . Renal disorder renal failure  . Cirrhosis of liver   . Drug addiction   . ETOH abuse   . Sleep apnea   . Hepatitis   . Systolic and diastolic CHF, acute on chronic 04/19/2013   Past Surgical History  Procedure Laterality Date  . Av fistula placement Right   . Insertion of dialysis catheter N/A 08/29/2013    Procedure: INSERTION OF DIALYSIS CATHETER;  Surgeon: Mal Misty, MD;  Location: [place];  Service: Vascular;  Laterality: N/A;  . Av fistula placement Left 09/03/2013    Procedure: ARTERIOVENOUS (AV) FISTULA CREATION - LEFT BRACHIOCEPHALIC;  Surgeon: Conrad Boonville, MD;  Location: [place];  Service: Vascular;  Laterality: Left;   History   Social History  . Marital Status: Single    Spouse Name: N/A    Number of Children: N/A  . Years of Education: N/A   Social History Main Topics  . Smoking status: Never Smoker   . Smokeless tobacco: Never Used  . Alcohol Use: No  . Drug Use: No     Comment: Quit  . Sexual Activity: None   Other Topics Concern  . None   Social History Narrative  . None   Family History  Problem Relation Age of Onset  . Hypertension Mother   . Cancer Mother     breast   No Known Allergies Prior to Admission medications    Medication Sig Start Date End Date Taking? Authorizing Provider  amLODipine (NORVASC) 10 MG tablet Take 1 tablet (10 mg total) by mouth at bedtime. 09/04/13  Yes Ivor Costa, MD  aspirin EC 81 MG tablet Take 81 mg by mouth daily.   Yes Historical Provider, MD  calcium acetate (PHOSLO) 667 MG capsule Take 3 capsules (2,001 mg total) by mouth 3 (three) times daily with meals. 09/04/13  Yes Ivor Costa, MD  carvedilol (COREG) 25 MG tablet Take 2 tablets (50 mg total) by mouth 2 (two) times daily with a meal. 09/04/13  Yes Ivor Costa, MD  hydrALAZINE (APRESOLINE) 100 MG tablet Take 1 tablet (100 mg total) by mouth every 8 (eight) hours. 09/04/13  Yes Ivor Costa, MD  nitroGLYCERIN (NITROSTAT) 0.4 MG SL tablet Place 0.4 mg under the tongue every 5 (five) minutes as needed for chest pain.    Historical Provider, MD     Positive ROS:   All other systems have been reviewed and were otherwise negative with the exception of those mentioned in the HPI and as above.  Physical Exam: Filed Vitals:   02/13/14 1408  BP: 149/102  Pulse: 83  Resp: 20    General: Alert, no acute distress HEENT: Normal for age Cardiovascular: Regular rate and rhythm. Carotid pulses 2+, no bruits audible Respiratory: Clear to  auscultation. No cyanosis, no use of accessory musculature GI: No organomegaly, abdomen is soft and non-tender Skin: No lesions in the area of chief complaint Neurologic: Sensation intact distally Psychiatric: Patient is competent for consent with normal mood and affect Musculoskeletal: No obvious deformities Extremities: Left upper arm with excellent pulse and palpable thrill in fistula. 2+ radial pulse palpable distally. Significant swelling in left upper arm which does not seem to be expanding. Initially there was some arterial flow through the puncture site in the most distal aspect of the upper arm but this rapidly was controlled with compression over the site with good hemostasis. I examined the arm for  about 15-20 minutes. Measure the circumference of the left upper arm which maximally was 40 cm compared 32-33 cm and the contralateral right arm. The hematoma seems to be diffused throughout the upper arm rather than one site.     Assessment/Plan:  Hematoma left upper arm secondary to infiltration on dialysis. Left upper extremity does not appear to be continuously enlarging. Surgical evacuation will require significant incision and likely will not evacuate majority of hematoma since it has diffused into tissues. AV fistula is functioning well  We'll observe this for the next hour or 2 in the emergency department and if it does not continue to enlarge think this should be best treated by not utilizing the fistula for about 8 weeks and continuing to use the dialysis catheter in the right IJ allowing the hematoma to absorb. Discussed this with the patient he is in agreement. Will check him again later today to see if any change in the nurses are observing this   Tinnie Gens, MD 02/13/2014 3:08 PM     Patient checked again at 3:45 PM arm circumference down 2 cm is no evidence of active bleeding -- 2+ radial pulse palpable good sensation and motion left knee and  Okay to DC patient home in one hour at 5 PM. Please check with hemodialysis unit to see if patient is to report in a.m. Recommend using hemodialysis catheter for 8 weeks until hematoma absorbs

## 2014-02-13 NOTE — ED Notes (Signed)
PT had dialysis for ~1hr today and has only been on dialysis a few months. Dialysis ctr has been trying to increase needle size in L upper arm and think they infiltrated the site. PT's skin is very tight at graft site and has parasthesia and slightly less cap refill in L than R hand.

## 2014-02-13 NOTE — ED Notes (Signed)
R arm circumference 36.5cm

## 2014-02-13 NOTE — ED Notes (Signed)
MD at bedside. Vascular docs

## 2014-02-13 NOTE — ED Notes (Signed)
Spoke with patient about calling dialysis center to try and complete dialysis tomorrow. PT agreed to call dialysis ctr to schedule appt for tomorrow if they can fit him in

## 2014-02-13 NOTE — ED Notes (Signed)
PT L arm circumference 35.25cm, R arm 32 cm.

## 2014-09-18 ENCOUNTER — Other Ambulatory Visit: Payer: Self-pay | Admitting: *Deleted

## 2014-09-18 DIAGNOSIS — N186 End stage renal disease: Secondary | ICD-10-CM

## 2014-09-18 DIAGNOSIS — Z01810 Encounter for preprocedural cardiovascular examination: Secondary | ICD-10-CM

## 2014-09-24 ENCOUNTER — Encounter: Payer: Self-pay | Admitting: Vascular Surgery

## 2014-09-25 ENCOUNTER — Other Ambulatory Visit (HOSPITAL_COMMUNITY): Payer: Medicaid Other

## 2014-09-25 ENCOUNTER — Ambulatory Visit: Payer: Medicaid Other | Admitting: Vascular Surgery

## 2014-09-25 ENCOUNTER — Encounter (HOSPITAL_COMMUNITY): Payer: Medicaid Other

## 2014-11-11 ENCOUNTER — Encounter (HOSPITAL_COMMUNITY): Payer: Medicaid Other

## 2014-11-11 ENCOUNTER — Ambulatory Visit: Payer: Medicaid Other | Admitting: Vascular Surgery

## 2014-11-11 ENCOUNTER — Other Ambulatory Visit (HOSPITAL_COMMUNITY): Payer: Medicaid Other

## 2014-11-25 ENCOUNTER — Ambulatory Visit
Admit: 2014-11-25 | Disposition: A | Discharge status: Home / Self Care | Payer: Self-pay | Attending: Vascular Surgery | Admitting: Vascular Surgery

## 2014-12-06 NOTE — Op Note (Signed)
PATIENT NAME:  Patrick Anthony, Patrick Anthony MR#:  [id number] DATE OF BIRTH:  Jun 20, 1971  DATE OF PROCEDURE:  11/25/2014  PREOPERATIVE DIAGNOSES:  1.  Complication of arteriovenous fistula left arm.  2.  End-stage renal disease requiring hemodialysis, currently maintained on right IJ catheter.   POSTOPERATIVE DIAGNOSES:   1.  Complication of arteriovenous fistula left arm.  2.  End-stage renal disease requiring hemodialysis, currently maintained on right IJ catheter.   PROCEDURES PERFORMED: 1.  Contrast injection left brachiocephalic fistula.  2.  Percutaneous transluminal angioplasty, left brachiocephalic fistula to 6 mm, with initial angioplasty using Lutonix balloons.   SURGEON: Katha Cabal, MD   SEDATION: Versed plus fentanyl.   CONTRAST USED: Isovue 38 mL.   FLUOROSCOPY TIME: 5.6 minutes.   ACCESS: A 6 French sheath, antegrade direction, left brachiocephalic fistula.   INDICATIONS: Patrick Anthony is a 44 year old gentleman who is now on hemodialysis. He underwent fistula creation at another institution many months ago, but it has not matured to the point of being able to be used. He is therefore undergoing evaluation. Risks and benefits for angiography with the hope for intervention were reviewed. All questions answered. The patient agrees to proceed.   DESCRIPTION OF PROCEDURE: The patient is taken to special procedures and placed in the supine position. After adequate sedation is achieved, he is positioned supine. His left arm is extended palm upward. The left arm is prepped and draped in a sterile fashion. Appropriate timeout is called.   Lidocaine 1% is infiltrated into the soft tissues overlying the fistula near the arterial anastomosis and access is obtained with a microneedle in an antegrade direction. Microwire followed by micro sheath, J-wire followed by a 6 French sheath.   Hand injection of contrast demonstrates the fistula in cooperation with physical examination, several cm more  proximally on the arm, where the fistula becomes much harder to feel. There is a very high-grade stricture, of interest and not typically seen as the cephalic vein is imaged all the way up to its confluence with the subclavian vein. There are multiple strictures, some greater than 95%, total of 5 or 6, all semi-individual or discrete. The subclavian vein, innominate vein, and superior vena cava all are widely patent; arterial anastomosis widely patent.   Heparin 3000 units is given and a Magic torque wire is negotiated into the central venous system. Initially, beginning with 5 mm Lutonix balloons, a total of 3 were required two 5 x 15, and one 5 x 6, all inflations were to 14 atmospheres and for a little over 1 minute. Subsequently, a 6 mm x 8 cm Dorado balloon was utilized and angioplasty, again, was performed, beginning at the cephalic confluence and extending all the way back to the tip of the sheath.   Follow-up imaging after the angioplasties demonstrates after each individual balloon treatment, there was significant improvement; however, even after the 6 mm, it is clear that this fistula will not yet be serviceable. Unfortunately, with 6 mm inflations, the patient had significant pain and therefore we are not moving forward at this time with 7 mm inflations.   The plan was discussed with the patient. We will see him back in the office and then bring him back likely for a 6, 7, and possibly an 8 mm inflation, as a second round of balloon assisted maturation.   INTERPRETATION: The fistula demonstrates multiple areas of narrowing as described above, most unusual as there is so many. Following angioplasty there is significant improvement, but there has  not yet a fistula which is going to be readily or easily accessible. He will require further balloon assisted maturation and may ultimately require prosthetic as a conversion from pure fistula to a hybrid.   This was discussed with the patient  postoperatively.    ____________________________ Katha Cabal, MD ggs:nt D: 11/25/2014 21:14:04 ET T: 11/25/2014 23:48:48 ET JOB#: [id number]  cc: Katha Cabal, MD, <Dictator> Katha Cabal MD ELECTRONICALLY SIGNED 12/01/2014 12:48

## 2014-12-23 ENCOUNTER — Encounter: Admission: RE | Payer: Self-pay | Source: Ambulatory Visit

## 2014-12-23 ENCOUNTER — Encounter: Payer: Self-pay | Admitting: Anesthesiology

## 2014-12-23 ENCOUNTER — Ambulatory Visit: Admission: RE | Admit: 2014-12-23 | Payer: Medicaid Other | Source: Ambulatory Visit | Admitting: Vascular Surgery

## 2014-12-23 SURGERY — UPPER EXTREMITY ANGIOGRAPHY
Anesthesia: Monitor Anesthesia Care | Laterality: Left

## 2014-12-23 NOTE — Anesthesia Preprocedure Evaluation (Deleted)
Anesthesia Evaluation  Patient identified by MRN, date of birth, ID band Patient awake    Reviewed: Allergy & Precautions, NPO status   History of Anesthesia Complications Negative for: history of anesthetic complications  Airway        Dental   Pulmonary sleep apnea ,          Cardiovascular hypertension, +CHF     Neuro/Psych negative neurological ROS  negative psych ROS   GI/Hepatic (+) Hepatitis -  Endo/Other  negative endocrine ROS  Renal/GU CRF and DialysisRenal disease  negative genitourinary   Musculoskeletal negative musculoskeletal ROS (+)   Abdominal   Peds negative pediatric ROS (+)  Hematology  (+) anemia ,   Anesthesia Other Findings   Reproductive/Obstetrics negative OB ROS                            Anesthesia Physical Anesthesia Plan Anesthesia Quick Evaluation

## 2015-01-06 ENCOUNTER — Encounter
Admission: RE | Disposition: A | Discharge status: Home / Self Care | Payer: Self-pay | Source: Ambulatory Visit | Attending: Vascular Surgery

## 2015-01-06 ENCOUNTER — Ambulatory Visit
Admission: RE | Admit: 2015-01-06 | Discharge: 2015-01-06 | Disposition: A | Discharge status: Home / Self Care | Payer: Medicaid Other | Source: Ambulatory Visit | Attending: Vascular Surgery | Admitting: Vascular Surgery

## 2015-01-06 ENCOUNTER — Ambulatory Visit: Payer: Medicaid Other | Admitting: Anesthesiology

## 2015-01-06 ENCOUNTER — Encounter: Payer: Self-pay | Admitting: *Deleted

## 2015-01-06 DIAGNOSIS — T82858A Stenosis of vascular prosthetic devices, implants and grafts, initial encounter: Secondary | ICD-10-CM | POA: Insufficient documentation

## 2015-01-06 DIAGNOSIS — I120 Hypertensive chronic kidney disease with stage 5 chronic kidney disease or end stage renal disease: Secondary | ICD-10-CM | POA: Diagnosis not present

## 2015-01-06 DIAGNOSIS — E1122 Type 2 diabetes mellitus with diabetic chronic kidney disease: Secondary | ICD-10-CM | POA: Insufficient documentation

## 2015-01-06 DIAGNOSIS — F17200 Nicotine dependence, unspecified, uncomplicated: Secondary | ICD-10-CM | POA: Insufficient documentation

## 2015-01-06 DIAGNOSIS — N186 End stage renal disease: Secondary | ICD-10-CM | POA: Diagnosis not present

## 2015-01-06 HISTORY — PX: PERIPHERAL VASCULAR CATHETERIZATION: SHX172C

## 2015-01-06 HISTORY — DX: Chronic kidney disease, unspecified: N18.9

## 2015-01-06 LAB — POTASSIUM (ARMC VASCULAR LAB ONLY): Potassium (ARMC vascular lab): 5.1

## 2015-01-06 SURGERY — A/V SHUNTOGRAM/FISTULAGRAM
Anesthesia: General

## 2015-01-06 MED ORDER — MIDAZOLAM HCL 2 MG/2ML IJ SOLN
INTRAMUSCULAR | Status: DC | PRN
  Administered 2015-01-06: 1 mg via INTRAVENOUS
  Administered 2015-01-06: 2 mg via INTRAVENOUS
  Administered 2015-01-06: 1 mg via INTRAVENOUS

## 2015-01-06 MED ORDER — LIDOCAINE HCL (PF) 1 % IJ SOLN
INTRAMUSCULAR | Status: AC
  Filled 2015-01-06: qty 10

## 2015-01-06 MED ORDER — ONDANSETRON HCL 4 MG/2ML IJ SOLN: 4.0000 mg | INTRAMUSCULAR | Status: DC | PRN

## 2015-01-06 MED ORDER — PROPOFOL 10 MG/ML IV BOLUS
INTRAVENOUS | Status: DC | PRN
  Administered 2015-01-06: 80 mg via INTRAVENOUS

## 2015-01-06 MED ORDER — ATROPINE SULFATE 0.1 MG/ML IJ SOLN: 0.5000 mg | Freq: Once | INTRAMUSCULAR | Status: DC | PRN

## 2015-01-06 MED ORDER — HYDROMORPHONE HCL 1 MG/ML IJ SOLN: 1.0000 mg | INTRAMUSCULAR | Status: DC | PRN

## 2015-01-06 MED ORDER — CEFAZOLIN SODIUM 1-5 GM-% IV SOLN: 1.0000 g | Freq: Once | INTRAVENOUS | Status: DC

## 2015-01-06 MED ORDER — LABETALOL HCL 5 MG/ML IV SOLN
INTRAVENOUS | Status: DC | PRN
  Administered 2015-01-06: 5 mg via INTRAVENOUS

## 2015-01-06 MED ORDER — PROPOFOL INFUSION 10 MG/ML OPTIME
INTRAVENOUS | Status: DC | PRN
  Administered 2015-01-06: 100 ug/kg/min via INTRAVENOUS

## 2015-01-06 MED ORDER — HEPARIN SODIUM (PORCINE) 1000 UNIT/ML IJ SOLN
INTRAMUSCULAR | Status: DC | PRN
  Administered 2015-01-06: 3000 [IU] via INTRAVENOUS

## 2015-01-06 MED ORDER — FENTANYL CITRATE (PF) 100 MCG/2ML IJ SOLN
INTRAMUSCULAR | Status: DC | PRN
  Administered 2015-01-06 (×2): 50 ug via INTRAVENOUS

## 2015-01-06 MED ORDER — HEPARIN (PORCINE) IN NACL 2-0.9 UNIT/ML-% IJ SOLN
INTRAMUSCULAR | Status: AC
  Filled 2015-01-06: qty 1000

## 2015-01-06 MED ORDER — LIDOCAINE HCL (CARDIAC) 20 MG/ML IV SOLN
INTRAVENOUS | Status: DC | PRN
  Administered 2015-01-06: 60 mg via INTRAVENOUS

## 2015-01-06 MED ORDER — DEXTROSE 50 % IV SOLN: 0.5000 | Freq: Once | INTRAVENOUS | Status: DC | PRN

## 2015-01-06 MED ORDER — SODIUM CHLORIDE 0.9 % IV SOLN
INTRAVENOUS | Status: DC
  Administered 2015-01-06: 13:00:00 via INTRAVENOUS

## 2015-01-06 SURGICAL SUPPLY — 24 items
BALLN DORADO 6X80X80 (BALLOONS) ×3
BALLN DORADO 7X20X80 (BALLOONS) ×3
BALLN DORADO 8X40X80 (BALLOONS) ×3
BALLN DORADO 9X40X80 (BALLOONS) ×3
BALLN DORADO 9X80X80 (BALLOONS) ×3
BALLN ULTRVRSE 8X200X130 (BALLOONS) ×3
BALLOON DORADO 6X80X80 (BALLOONS) ×2 IMPLANT
BALLOON DORADO 7X20X80 (BALLOONS) ×2 IMPLANT
BALLOON DORADO 8X40X80 (BALLOONS) ×2 IMPLANT
BALLOON DORADO 9X40X80 (BALLOONS) ×2 IMPLANT
BALLOON DORADO 9X80X80 (BALLOONS) ×2 IMPLANT
BALLOON ULTRVRSE 8X200X130 (BALLOONS) ×2 IMPLANT
CATH GLIDE EXCHANG 5FR (CATHETERS) ×3 IMPLANT
DEVICE PRESTO INFLATION (MISCELLANEOUS) ×3 IMPLANT
DRAPE BRACHIAL (DRAPES) ×3 IMPLANT
PACK ANGIOGRAPHY (CUSTOM PROCEDURE TRAY) ×3 IMPLANT
SET INTRO CAPELLA COAXIAL (SET/KITS/TRAYS/PACK) ×3 IMPLANT
SHEATH BRITE TIP 6FRX5.5 (SHEATH) ×3 IMPLANT
SHEATH BRITE TIP 7FRX5.5 (SHEATH) ×3 IMPLANT
STENT VIABAHN 8X50X120 (Permanent Stent) ×1 IMPLANT
STENT VIABAHN5X120X8FR 8X (Permanent Stent) ×2 IMPLANT
TOWEL OR 17X26 4PK STRL BLUE (TOWEL DISPOSABLE) ×3 IMPLANT
WIRE G 018X200 V18 (WIRE) ×3 IMPLANT
WIRE MAGIC TORQUE 260C (WIRE) ×3 IMPLANT

## 2015-01-06 NOTE — Anesthesia Preprocedure Evaluation (Signed)
Anesthesia Evaluation  Patient identified by MRN, date of birth, ID band Patient awake    Reviewed: Allergy & Precautions, NPO status , Patient's Chart, lab work & pertinent test results  History of Anesthesia Complications Negative for: history of anesthetic complications  Airway Mallampati: III  TM Distance: >3 FB Neck ROM: Full    Dental  (+) Chipped,    Pulmonary Current Smoker,  Smokes 1 pack/week   Pulmonary exam normal       Cardiovascular Exercise Tolerance: Good hypertension, Pt. on medications Normal cardiovascular exam    Neuro/Psych negative neurological ROS  negative psych ROS   GI/Hepatic negative GI ROS, Neg liver ROS,   Endo/Other  negative endocrine ROS  Renal/GU ESRFRenal diseasenegative Renal ROS  negative genitourinary   Musculoskeletal negative musculoskeletal ROS (+)   Abdominal   Peds negative pediatric ROS (+)  Hematology negative hematology ROS (+)   Anesthesia Other Findings   Reproductive/Obstetrics negative OB ROS                             Anesthesia Physical Anesthesia Plan  ASA: III  Anesthesia Plan: General   Post-op Pain Management:    Induction: Intravenous  Airway Management Planned: Simple Face Mask  Additional Equipment:   Intra-op Plan:   Post-operative Plan:   Informed Consent: I have reviewed the patients History and Physical, chart, labs and discussed the procedure including the risks, benefits and alternatives for the proposed anesthesia with the patient or authorized representative who has indicated his/her understanding and acceptance.   Dental advisory given  Plan Discussed with: CRNA and Surgeon  Anesthesia Plan Comments:         Anesthesia Quick Evaluation

## 2015-01-06 NOTE — Anesthesia Postprocedure Evaluation (Signed)
  Anesthesia Post-op Note  Patient: Patrick Anthony  Procedure(s) Performed: Procedure(s): A/V Shuntogram/Fistulagram (N/A) A/V Shunt Intervention (Left)  Anesthesia type:General  Patient location: PACU  Post pain: Pain level controlled  Post assessment: Post-op Vital signs reviewed, Patient's Cardiovascular Status Stable, Respiratory Function Stable, Patent Airway and No signs of Nausea or vomiting  Post vital signs: Reviewed and stable  Last Vitals:  Filed Vitals:   01/06/15 1239  BP: 147/119  Pulse: 101  Temp: 36.8 C  Resp: 15    Level of consciousness: awake, alert  and patient cooperative  Complications: No apparent anesthesia complications

## 2015-01-06 NOTE — Op Note (Signed)
OPERATIVE NOTE   PROCEDURE: 1. left brachiocephalic arteriovenous fistula cannulation  2. left arm fistulagram 3. Percutaneous transluminal angioplasty with stent placement of the subclavian cephalic confluence to 8 mm 4. Percutaneous transluminal angioplasty of the venous portion of the AV fistula to a maximal diameter of 9 mm proximally  PRE-OPERATIVE DIAGNOSIS: Complication left arteriovenous fistula with inability to cannulate                                                       End Stage Renal Disease  POST-OPERATIVE DIAGNOSIS: same as above   SURGEON: Katha Cabal, M.D.  ANESTHESIA: Conscious Sedation   ESTIMATED BLOOD LOSS: minimal  FINDING(S): 1. Markedly pulsatile fistula small in diameter by palpation  SPECIMEN(S):  None  CONTRAST: 34 cc  FLUOROSCOPY TIME: 7.4 minutes  INDICATIONS: Patrick Anthony is a 44 y.o. male who  presents with malfunctioning left AV access.  The patient is scheduled for left brachiocephalic fistula angiography and intervention .  The patient is aware the risks include but are not limited to: bleeding, infection, thrombosis of the cannulated access, and possible anaphylactic reaction to the contrast.  The patient acknowledges if the access can not be salvaged a tunneled catheter will be needed and will be placed during this procedure.  The patient is aware of the risks of the procedure and elects to proceed with the angiogram and intervention.  DESCRIPTION: After full informed written consent was obtained, the patient was brought back to the Special Procedure suite and placed supine position.  Appropriate cardiopulmonary monitors were placed.  The left arm was prepped and draped in the standard fashion.  Appropriate timeout is called. The left brachiocephalic fistula  was cannulated with a micropuncture needle.  The microwire was advanced and the needle was exchanged for  a microsheath.  The J-wire was then advanced and a 6 Fr sheath inserted.   Hand injections were completed to image the access from the arterial anastomosis through the entire access.  The central venous structures were also imaged by hand injections.  Based on the images,  3000 units of heparin was given and a Magic torque wire was advanced under fluoroscopic guidance into the superior vena cava. Initially a 6 x 8 Dorado balloon was used really dilate the fistula from the arterial anastomosis through the cephalic subclavian confluence. Then a 7 x 20 Dorado balloon was used after this a 8 x 20 arrival balloon was used again inflations were just distal to the arterial anastomosis through the subclavian cephalic confluence. Follow-up imaging after the inflations demonstrated a critical stenosis greater than 80% at the subclavian cephalic confluence a second area just proximal to the arterial anastomosis within 5 mm at the sheath insertion site extending several centimeters was also greater than 80%.  A 9 x 4 Dorado was then used to do a single angioplasty at the arterial portion of the AV graft in the area that would be the site of cannulation for the arterial needle this represents the 80% narrowing mentioned in the text above close to the arterial anastomosis. Inflation was to 16 atm for approximate 1 minute. Follow-up imaging demonstrates a fairly good result with 20% residual stenosis  Based on the appearance of the subclavian cephalic confluence a a stent would be required here a 8 x 50 Viabahn stent was selected  catheter was advanced over the Magic torque wire and the 035 wire exchanged for the V 1801 8 wire. This was done after the sheath was upsized to a 7 [place] sheath. The 8 x 50 Viabahn stent was then extended across the lesion advancing the stent 3-4 mm into the subclavian vein and then deploying the stent without difficulty was postdilated with an 8 x 40 Dorado balloon inflated to 20 atm.  Follow-up imaging now demonstrated that there was rapid flow of contrast through  the fistula and general the fistula was now significantly improved diameter-wise. The stent within the cephalic subclavian confluence is widely patent. The area of high-grade stenosis near the arterial anastomosis is significantly improved. And by palpation the fistula now has much less pulsatility and a continuously palpable thrill.   A 4-0 Monocryl purse-string suture was sewn around the sheath.  The sheath was removed and light pressure was applied.  A sterile bandage was applied to the puncture site.  The patient will be seen back in the office in the next week or 2 with a duplex ultrasound of the AV fistula. At that time decision will be made as to whether one more round of angioplasty to the fistula proper will be required in order to have a suitable access for easy cannulation.    COMPLICATIONS: None  CONDITION: Patrick Anthony, M.D [facility] Office: [phone]  01/06/2015 4:15 PM

## 2015-01-06 NOTE — Transfer of Care (Signed)
Immediate Anesthesia Transfer of Care Note  Patient: Patrick Anthony  Procedure(s) Performed: Procedure(s): A/V Shuntogram/Fistulagram (N/A) A/V Shunt Intervention (Left)  Patient Location: PACU  Anesthesia Type:General  Level of Consciousness: sedated  Airway & Oxygen Therapy: Patient Spontanous Breathing and Patient connected to face mask oxygen  Post-op Assessment: Report given to RN  Post vital signs: Reviewed and stable  Last Vitals:  Filed Vitals:   01/06/15 1618  BP: 122/95  Pulse: 84  Temp: 97.4  Resp: 12    Complications: No apparent anesthesia complications

## 2015-01-08 ENCOUNTER — Encounter: Payer: Self-pay | Admitting: Vascular Surgery

## 2015-01-21 ENCOUNTER — Encounter: Payer: Self-pay | Admitting: Vascular Surgery

## 2015-01-21 NOTE — Addendum Note (Signed)
Addendum  created 01/21/15 1208 by Lorane Gell, MD   Modules edited: Anesthesia Attestations, Anesthesia Events, Narrator   Narrator:  Narrator: Event Log Edited

## 2015-04-08 ENCOUNTER — Encounter (HOSPITAL_COMMUNITY): Payer: Self-pay | Admitting: Emergency Medicine

## 2015-07-13 ENCOUNTER — Emergency Department: Admit: 2015-07-13 | Payer: MEDICAID

## 2015-07-13 ENCOUNTER — Inpatient Hospital Stay
Admit: 2015-07-13 | Discharge: 2015-07-14 | Disposition: A | Discharge status: Home / Self Care | Payer: MEDICAID | Attending: Emergency Medicine

## 2015-07-13 DIAGNOSIS — I132 Hypertensive heart and chronic kidney disease with heart failure and with stage 5 chronic kidney disease, or end stage renal disease: Secondary | ICD-10-CM

## 2015-07-13 LAB — METABOLIC PANEL, COMPREHENSIVE
A-G Ratio: 1 (ref 0.8–1.7)
ALT (SGPT): 29 U/L (ref 16–61)
AST (SGOT): 24 U/L (ref 15–37)
Albumin: 4 g/dL (ref 3.4–5.0)
Alk. phosphatase: 57 U/L (ref 45–117)
Anion gap: 18 mmol/L (ref 3.0–18)
BUN/Creatinine ratio: 5 — ABNORMAL LOW (ref 12–20)
BUN: 86 MG/DL — ABNORMAL HIGH (ref 7.0–18)
Bilirubin, total: 0.4 MG/DL (ref 0.2–1.0)
CO2: 24 mmol/L (ref 21–32)
Calcium: 8.4 MG/DL — ABNORMAL LOW (ref 8.5–10.1)
Chloride: 98 mmol/L — ABNORMAL LOW (ref 100–108)
Creatinine: 17.3 MG/DL — ABNORMAL HIGH (ref 0.6–1.3)
GFR est AA: 4 mL/min/{1.73_m2} — ABNORMAL LOW (ref >60)
GFR est non-AA: 3 mL/min/{1.73_m2} — ABNORMAL LOW (ref >60)
Globulin: 4.2 g/dL — ABNORMAL HIGH (ref 2.0–4.0)
Glucose: 97 mg/dL (ref 74–99)
Potassium: 4.8 mmol/L (ref 3.5–5.5)
Protein, total: 8.2 g/dL (ref 6.4–8.2)
Sodium: 140 mmol/L (ref 136–145)

## 2015-07-13 LAB — EKG, 12 LEAD, INITIAL
Atrial Rate: 80 {beats}/min
Calculated P Axis: 46 degrees
Calculated R Axis: 3 degrees
Calculated T Axis: 35 degrees
Diagnosis: NORMAL
P-R Interval: 198 ms
Q-T Interval: 398 ms
QRS Duration: 86 ms
QTC Calculation (Bezet): 459 ms
Ventricular Rate: 80 {beats}/min

## 2015-07-13 LAB — CBC WITH AUTOMATED DIFF
ABS. BASOPHILS: 0 10*3/uL (ref 0.0–0.06)
ABS. EOSINOPHILS: 0.2 10*3/uL (ref 0.0–0.4)
ABS. LYMPHOCYTES: 1.2 10*3/uL (ref 0.9–3.6)
ABS. MONOCYTES: 0.4 10*3/uL (ref 0.05–1.2)
ABS. NEUTROPHILS: 2.6 10*3/uL (ref 1.8–8.0)
BASOPHILS: 0 % (ref 0–2)
EOSINOPHILS: 5 % (ref 0–5)
HCT: 33.6 % — ABNORMAL LOW (ref 36.0–48.0)
HGB: 10.9 g/dL — ABNORMAL LOW (ref 13.0–16.0)
LYMPHOCYTES: 27 % (ref 21–52)
MCH: 27.9 PG (ref 24.0–34.0)
MCHC: 32.4 g/dL (ref 31.0–37.0)
MCV: 85.9 FL (ref 74.0–97.0)
MONOCYTES: 9 % (ref 3–10)
MPV: 10.4 FL (ref 9.2–11.8)
NEUTROPHILS: 59 % (ref 40–73)
PLATELET: 153 10*3/uL (ref 135–420)
RBC: 3.91 M/uL — ABNORMAL LOW (ref 4.70–5.50)
RDW: 14.8 % — ABNORMAL HIGH (ref 11.6–14.5)
WBC: 4.5 10*3/uL — ABNORMAL LOW (ref 4.6–13.2)

## 2015-07-13 LAB — PHOSPHORUS: Phosphorus: 8 MG/DL — ABNORMAL HIGH (ref 2.5–4.9)

## 2015-07-13 MED ORDER — CLONIDINE 0.1 MG TAB
0.1 mg | ORAL | Status: DC
Start: 2015-07-13 — End: 2015-07-13

## 2015-07-13 MED ORDER — CLONIDINE 0.1 MG TAB
0.1 mg | ORAL | Status: AC
Start: 2015-07-13 — End: 2015-07-13
  Administered 2015-07-13: 23:00:00 via ORAL

## 2015-07-13 MED ORDER — LIDOCAINE (PF) 10 MG/ML (1 %) IJ SOLN
10 mg/mL (1 %) | INTRAMUSCULAR | Status: AC
Start: 2015-07-13 — End: 2015-07-14

## 2015-07-13 MED ORDER — LIDOCAINE HCL 2 % (20 MG/ML) IJ SOLN
20 mg/mL (2 %) | Freq: Once | INTRAMUSCULAR | Status: AC
Start: 2015-07-13 — End: 2015-07-14

## 2015-07-13 MED FILL — LIDOCAINE (PF) 10 MG/ML (1 %) IJ SOLN: 10 mg/mL (1 %) | INTRAMUSCULAR | Qty: 5

## 2015-07-13 MED FILL — CLONIDINE 0.1 MG TAB: 0.1 mg | ORAL | Qty: 1

## 2015-07-13 NOTE — ED Notes (Signed)
Called floor to give report to nurse; spoke with Robert Pierce; stated nurse and charge nurse unavailable.

## 2015-07-13 NOTE — H&P (Signed)
History and Physical    Patient: Robert Pierce MRN: [id number]  SSN: [id number]    Date of Birth: 01-04-1971  Age: 44 y.o.  Sex: male      Subjective:      [name] is a 44 y.o. male with PMH of ESRD , HTN being admitted for SOB related to Missed HD .    Patient is historian     Patient is from [place] and his mother is admitted here , he came to see her . For reasons of his own he did not get HD for last 5 cycles and he did not take his meds for same length of time . Her in ED he is in mild distress . Renal consulted . He will get urgent HD     Full CODE   DVT prophylaxis - Heparin     Past Medical History   Diagnosis Date   ??? Alcohol abuse    ??? Diastolic heart failure ([place])    ??? Gastrointestinal bleeding    ??? Hepatitis C    ??? High blood pressure 01/13/2011   ??? History of echocardiogram 01/03/2011     EF 55-60%.  Gr 2 DDfx.  Increased RA pressure.  No R-L shunt.  Mild RAE.     ??? LE venous duplex 01/09/2011     No deep or superficial venous thrombosis in bilateral lower extremities.     ??? Migraine headache 01/13/2011     No past surgical history on file.   No family history on file.  Social History   Substance Use Topics   ??? Smoking status: Current Every Day Smoker     Types: Cigarettes   ??? Smokeless tobacco: Never Used   ??? Alcohol use No      Prior to Admission medications    Medication Sig Start Date End Date Taking? Authorizing Provider   albuterol (PROVENTIL HFA, VENTOLIN HFA) 90 mcg/actuation inhaler Take 1 Puff by inhalation as needed.      Historical Provider   furosemide (LASIX) 20 mg tablet Take 1 Tab by mouth daily. 02/21/11   Eulis Canner, MD   pantoprazole (PROTONIX) 40 mg tablet Take 40 mg by mouth daily.      Historical Provider   traZODone (DESYREL) 100 mg tablet Take 1 Tab by mouth nightly. 02/17/11   Thana Farr, MD   citalopram (CELEXA) 20 mg tablet Take 1 Tab by mouth daily. 02/17/11   Thana Farr, MD   hydrALAZINE (APRESOLINE) 25 mg tablet Take 1 Tab by mouth three (3) times  daily. 02/03/11   Thana Farr, MD   thiamine (B-1) 100 mg tablet Take 1 Tab by mouth daily. 02/03/11   Thana Farr, MD   multivitamin-iron, hematinic, (CEROVITE ADVANCED FORMULA) 27-0.4 mg Tab tablet Take 1 Tab by mouth daily. 02/03/11   Thana Farr, MD   metoprolol (LOPRESSOR) 50 mg tablet Take  by mouth two (2) times a day.      Historical Provider   amLODIPine (NORVASC) 10 mg tablet Take 1 Tab by mouth daily. 01/13/11   Thana Farr, MD        No Known Allergies    Review of Systems:  A comprehensive review of systems was negative except for that written in the History of Present Illness.    Objective:     Vitals:    07/13/15 1035 07/13/15 1130 07/13/15 1145 07/13/15 1200   BP: (!) 194/115 (!) 177/121 (!) 173/116 Marland Kitchen)  175/121   Pulse: 98 88 87 84   Resp: 16 12 11 11    Temp: 10.2 ??F (36.7 ??C)      SpO2: 99% 100% 98% 98%   Weight: 79.4 kg (175 lb)      Height: 6' (1.829 m)           Physical Exam:  General appearance - alert, well appearing, and in no distress  Mental status - alert, oriented to person, place, and time  Eyes - pupils equal and reactive, extraocular eye movements intact  Ears - bilateral TM's and external ear canals normal  Nose - normal and patent, no erythema, discharge or polyps  Mouth - mucous membranes moist, pharynx normal without lesions  Neck - supple, no significant adenopathy  Chest - clear to auscultation, no wheezes, rales or rhonchi, symmetric air entry  Heart - normal rate, regular rhythm, normal S1, S2, no murmurs, rubs, clicks or gallops  Abdomen - soft, nontender, nondistended, no masses or organomegaly  Neurological - alert, oriented, normal speech, no focal findings or movement disorder noted  Musculoskeletal - no joint tenderness, deformity or swelling  Extremities - peripheral pulses normal, no pedal edema, no clubbing or cyanosis  Skin - normal coloration and turgor, no rashes, no suspicious skin lesions noted     Assessment:     Hospital Problems  Date Reviewed: Jul 27, 2015           Codes Class Noted POA    ESRD (end stage renal disease) ([place]) ICD-10-CM: N18.6  ICD-9-CM: 585.6  07/27/15 Unknown        Shortness of breath ICD-10-CM: R06.02  ICD-9-CM: 786.05  Jul 27, 2015 Unknown        Iron deficiency anemia ICD-10-CM: D50.9  ICD-9-CM: 280.9  07/27/2015 Unknown        High blood pressure ICD-10-CM: I10  ICD-9-CM: 401.9  01/13/2011 Yes              CBC:  Lab Results   Component Value Date/Time    WBC 4.5 27-Jul-2015 11:30 AM    HGB 10.9 27-Jul-2015 11:30 AM    HCT 33.6 07/27/2015 11:30 AM    PLATELET 153 2015-07-27 11:30 AM    MCV 85.9 2015-07-27 11:30 AM        CMP:  Lab Results   Component Value Date/Time    SODIUM 140 2015-07-27 11:30 AM    POTASSIUM 4.8 2015-07-27 11:30 AM    CHLORIDE 98 07-27-15 11:30 AM    CO2 24 2015-07-27 11:30 AM    ANION GAP 18 27-Jul-2015 11:30 AM    GLUCOSE 97 2015/07/27 11:30 AM    BUN 86 07-27-2015 11:30 AM    CREATININE 17.30 07-27-2015 11:30 AM    BUN/CREATININE RATIO 5 07/27/15 11:30 AM    GFR EST AA 4 27-Jul-2015 11:30 AM    GFR EST NON-AA 3 07/27/15 11:30 AM    CALCIUM 8.4 07/27/15 11:30 AM    ALT 29 2015/07/27 11:30 AM    AST 24 07-27-2015 11:30 AM    ALK. PHOSPHATASE 57 Jul 27, 2015 11:30 AM    PROTEIN, TOTAL 8.2 07-27-15 11:30 AM    ALBUMIN 4.0 07-27-2015 11:30 AM    GLOBULIN 4.2 07-27-2015 11:30 AM    A-G RATIO 1.0 07-27-15 11:30 AM        PT/INR  Lab Results   Component Value Date/Time    INR 1.1 02/13/2011 01:00 PM    PROTHROMBIN TIME 13.7 02/13/2011 01:00 PM  EKG:   Results for orders placed or performed in visit on 02/21/11   AMB POC EKG ROUTINE W/ 12 LEADS, INTER & REP     Status: None    Impression    See progress note.           Plan:   HTN   - Uncontrolled   - from non compliance  - patient does not remember details of meds but says he take hydralazine and Norvasc     ESRD   - Missed HD   - Urgent HD arranged at ED   - D/W renal     Dyspnea   - Most likely related to Missed HD     Anemia   - Better today than baseline        Compliance to meds and Med treatment emphasized    Denies current ETOH abuse and Tobacco use        Signed By: Eyvonne Left, MD     July 13, 2015

## 2015-07-13 NOTE — Other (Signed)
ACUTE HEMODIALYSIS FLOW SHEET    HEMODIALYSIS ORDERS: Physician: Dr Mortimer Fries     Dialyzer: revaclear         Duration: 4 hr  BFR: 400   DFR: 800   Dialysate:  Temp 37.0 K+   2    Ca+  2.5 Na 140 Bicarb 35   Weight:  86.9 kg    Bed Scale      Unable to Obtain       Dry weight/UF Goal: 4000 Access AVF LUE  Needle Gauge 15    Heparin   Bolus 7000     Units     Hourly       Units    None     Catheter locking solution: n/a   Pre BP:   184/124    Pulse:     85     Temperature:   97.8  Respirations: 16  Tx: NS       ml/Bolus  Other         N/A   Labs: Pre        Post:         N/A   Additional Orders(medications, blood products, hypotension management):  See below   N/A      Time Out/Safety Check   [facility] Consent Verified     CATHETER ACCESS: N/A   Right   Left   IJ     Fem    First use X-ray verified     Tunnel                 Non Tunneled   No S/S infection  Redness  Drainage Cultured Swelling Pain   Medical Aseptic Prep Utilized   Dressing Changed   Biopatch  Date:       Clotted   Patent   Flows: Good  Poor  Reversed   If access problem, Dr. notified: Yes    N/A  Date:           GRAFT/FISTULA ACCESS:  N/A     Right     Left     UE     LE   AVG   AVF        Buttonhole    Medical Aseptic Prep Utilized   No S/S infection  Redness  Drainage Cultured Swelling Pain    Bruit:    Strong     Weak       Thrill :    Strong     Weak       Needle Gauge: 15   Length: 1   If access problem, Dr. notified: Yes     N/A  Date:        Please describe access if present and not used:       GENERAL ASSESSMENT:    LUNGS:  Rate 16 SaO2%         N/A     Clear   Coarse   Crackles   Wheezing         Diminished     Location : RLL   LLL    RUL  LUL   Cough: Productive  Dry  N/A   Respirations:  Easy  Labored   Therapy:  RA  NC  l/min    Mask: NRB Venti       O2%                  Ventilator  Intubated   [employer]  BiPaP   CARDIAC: Regular       Irregular    Pericardial Rub   JVD           Monitored   Bedside   Remotely monitored  N/A  Rhythm:    EDEMA:  None  Generalized   Pitting  1     2     3     4                   Facial   Pedal    UE   LE   SKIN:    Warm   Hot      Cold    Dry      Pale    Diaphoretic                   Flushed   Jaundiced   Cyanotic   Rash   Weeping   LOC:     Alert      Oriented:     Person      Place  Time                Confused   Lethargic   Medicated   Non-responsive     GI / ABDOMEN:   Flat     Distended     Soft     Firm     Obese                              Diarrhea   Bowel Sounds   Nausea   Vomiting      GU / URINE ASSESSMENT: Voiding    Oliguria   Anuria     Foley      Incontinent      Incontinent Brief        Bathroom Privileges     PAIN:  0 1  2   3   4   5   6   7   8   9   10             Scale 0-10  Action/Follow Up:    MOBILITY:   Amb     Amb/Assist     Bed     Wheelchair   Stretcher      All Vitals and Treatment Details on Attached [facility]: [facility]          Room #       1st Time Acute   Stat   Routine   Urgent      Acute Room    Bedside   ICU/CCU   ER   Isolation Precautions:   Dialysis    Airborne    Contact     Reverse   Special Considerations:          Blood Consent Verified N/A     ALLERGIES:    NKA          Code Status:   Full Code   DNR   Other           HBsAg ONLY: Date Drawn 06/28/15         Negative Positive Unknown   HBsAb: Date 06/28/15     Susceptible    Immune10 Not Drawn   Drawn     Current Labs:    Date of Labs:  Today         Cut and paste current labs here.    Results for Robert Pierce, Robert Pierce (MRN [id number]) as of 07/13/2015 19:44   Ref. Range 07/13/2015 11:30   WBC Latest Ref Range: 4.6 - 13.2 K/uL 4.5 (L)   RBC Latest Ref Range: 4.70 - 5.50 M/uL 3.91 (L)   HGB Latest Ref Range: 13.0 - 16.0 g/dL 10.9 (L)   HCT Latest Ref Range: 36.0 - 48.0 % 33.6 (L)   MCV Latest Ref Range: 74.0 - 97.0 FL 85.9   MCH Latest Ref Range: 24.0 - 34.0 PG 27.9   MCHC Latest Ref Range: 31.0 - 37.0 g/dL 32.4    RDW Latest Ref Range: 11.6 - 14.5 % 14.8 (H)   PLATELET Latest Ref Range: 135 - 420 K/uL 153   MPV Latest Ref Range: 9.2 - 11.8 FL 10.4   NEUTROPHILS Latest Ref Range: 40 - 73 % 59   LYMPHOCYTES Latest Ref Range: 21 - 52 % 27   MONOCYTES Latest Ref Range: 3 - 10 % 9   EOSINOPHILS Latest Ref Range: 0 - 5 % 5   BASOPHILS Latest Ref Range: 0 - 2 % 0   DF Latest Units:   AUTOMATED   ABS. NEUTROPHILS Latest Ref Range: 1.8 - 8.0 K/UL 2.6   ABS. LYMPHOCYTES Latest Ref Range: 0.9 - 3.6 K/UL 1.2   ABS. MONOCYTES Latest Ref Range: 0.05 - 1.2 K/UL 0.4   ABS. EOSINOPHILS Latest Ref Range: 0.0 - 0.4 K/UL 0.2   ABS. BASOPHILS Latest Ref Range: 0.0 - 0.06 K/UL 0.0   Results for NIHAAL, FRIESEN (MRN [id number]) as of 07/13/2015 19:44   Ref. Range 07/13/2015 11:30   Sodium Latest Ref Range: 136 - 145 mmol/L 140   Potassium Latest Ref Range: 3.5 - 5.5 mmol/L 4.8   Chloride Latest Ref Range: 100 - 108 mmol/L 98 (L)   CO2 Latest Ref Range: 21 - 32 mmol/L 24   Anion gap Latest Ref Range: 3.0 - 18 mmol/L 18   Glucose Latest Ref Range: 74 - 99 mg/dL 97   BUN Latest Ref Range: 7.0 - 18 MG/DL 86 (H)   Creatinine Latest Ref Range: 0.6 - 1.3 MG/DL 17.30 (H)   BUN/Creatinine ratio Latest Ref Range: 12 - 20   5 (L)   Calcium Latest Ref Range: 8.5 - 10.1 MG/DL 8.4 (L)   Phosphorus Latest Ref Range: 2.5 - 4.9 MG/DL 8.0 (H)   GFR est non-AA Latest Ref Range: >60 ml/min/1.41m 3 (L)   GFR est AA Latest Ref Range: >60 ml/min/1.754m4 (L)   Bilirubin, total Latest Ref Range: 0.2 - 1.0 MG/DL 0.4   Protein, total Latest Ref Range: 6.4 - 8.2 g/dL 8.2   Albumin Latest Ref Range: 3.4 - 5.0 g/dL 4.0   Globulin Latest Ref Range: 2.0 - 4.0 g/dL 4.2 (H)   A-G Ratio Latest Ref Range: 0.8 - 1.7   1.0   ALT Latest Ref Range: 16 - 61 U/L 29   AST Latest Ref Range: 15 - 37 U/L 24   Alk. phosphatase Latest Ref Range: 45 - 117 U/L 57  DIET:   Renal     Other      NPO       Diabetic      PRIMARY NURSE REPORT: First initial/Last name/Title      Pre Dialysis: Pecola Lawless RN    Time: 15:10      EDUCATION:     Patient  Other         Knowledge Basis: None Minimal  Substantial   Barriers to learning  N/A    Access Care      S&S of infection      Fluid Management     K+     Procedural    Albumin      Medications      Tx Options      Transplant      Diet      Other   Teaching Tools:   Explain   Demo   Handouts  Video  Patient response:    Verbalized understanding   Teach back   Return demonstration  Requires follow up   Inappropriate due to            [place] ??? Before each treatment:     Machine Number:                   [facility]                                   Unit Machine # 8 with centralized RO                                   Portable Machine #1/RO serial # [id number]                                   Portable Machine #2/RO serial # [id number]                                   Portable Machine #3/RO serial # [id number]                                                                                                       [place] #11/RO serial # [id number]                                    Portable Machine #12/RO serial # [id number]  Portable Machine #13/RO serial #  [id number]      Alarm Test:  Pass time 15:30         Other:          RO/Machine Log Complete      Temp    37.0            Extracorporeal Circuit Tested for integrity   Dialysate: pH  7.4 Conductivity: Meter   13.8     HD Machine   14.3                  TCD: 13.9  Dialyzer Lot # [id number]       Blood Tubing Lot # [id number]     Saline Lot #  [id number]     CHLORINE TESTING-Before each treatment and every 4 hours    Total Chlorine:  less than 0.1 ppm  Time: 15:30 4 Hr/2nd Check Time:    (if greater than 0.1 ppm from Primary then every 30 minutes from Secondary)      TREATMENT INITIATION ??? with Dialysis Precautions:    All Connections Secured                  Saline Line Double Clamped    Venous Parameters Set                   Arterial Parameters Set     Prime Given  250 ml               Air Foam Detector Engaged      Treatment Initiation Note: Patient arrived from ED in stable condition. AVF LUE cannulated with 15g fistula needles. Dialysis started as ordered.      Medication Dose Volume Route Initials Dialyzer Cleared:  Good  Fair   Poor    Blood processed:  84.3 L  UF Removed  4000 Ml    Post Wt: 81.5    kg  POst BP:   162/117       Pulse: 92      Respirations: 16  Temperature: 98.0          Clonidine      0.35m      1 tab      oral      JRT Post Tx Vascular Access: AVF/AVG: Bleeding stopped Art 5 min. Ven. 5 Min   N/A                                   Catheter: Locking solution: Heparin 1:1000 Art.   Ven.   N/A                                 Post Assessment:                                    Skin:   Warm   Dry  Diaphoretic     Flushed   Pale  Cyanotic   [facility] Signatures Title Initials  Time Lungs:  Clear     Course   Crackles   Wheezing  Diminished   JAngelique HolmRN JRT  Cardiac:  Regular    Irregular    Monitor   N/A  Rhythm:  Edema:   None     General      Facial    Pedal     UE     LE       Pain: 0  1  2   3  4   5   6   7   8   9   10          Post Treatment Note: Patient tolerated HD tx well and forwarded to room 472 post dialysis. Report given to receiving nurse.      POST TREATMENT PRIMARY NURSE HANDOFF REPORT:     First initial/Last name/Title         Post Dialysis: Rivka Barbara RN Time:  20:20     Abbreviations: AVG-arterial venous graft, AVF-arterial venous fistula, IJ-Internal Jugular, Subcl-Subclavian, Fem-Femoral, Tx-treatment, AP/HR-apical heart rate, DFR-dialysate flow rate, BFR-blood flow rate, AP-arterial pressure, VP-venous pressure, UF-ultrafiltrate, TMP-transmembrane pressure, Ven-Venous, Art-Arterial, RO-Reverse Osmosis

## 2015-07-13 NOTE — Consults (Signed)
Consult Note  Consult requested by: Dr. Almira Coaster Pierce is a 44 y.o. male BLACK OR AFR[place] AMERICAN who is being seen on consult for ESRD/HD  Chief Complaint   Patient presents with   ??? Hypertension   ??? Shortness of Breath   ??? Foot Swelling     Admission diagnosis: <principal problem not specified>       HPI: 44 yo AA male here from [place] to be with his dying mother ( currently in the ICU). He has bee on HD for about 2 years due to HTN and had his last HD on Friday. He arrived here early Sat w/o any arrangements made for HD here. He denies any CP but feels a little SOB. He also did not bring any of his outpt meds.  He is not sure when he will be able to go back to his home unit. He still makes some urine.  Denies any problems with HD, tolerates HD treatments well avf has been working well.    Past Medical History   Diagnosis Date   ??? Alcohol abuse    ??? Diastolic heart failure (HCC)    ??? Gastrointestinal bleeding    ??? Hepatitis C    ??? High blood pressure 01/13/2011   ??? History of echocardiogram 01/03/2011     EF 55-60%.  Gr 2 DDfx.  Increased RA pressure.  No R-L shunt.  Mild RAE.     ??? LE venous duplex 01/09/2011     No deep or superficial venous thrombosis in bilateral lower extremities.     ??? Migraine headache 01/13/2011      No past surgical history on file.    Social History     Social History   ??? Marital status: SINGLE     Spouse name: N/A   ??? Number of children: N/A   ??? Years of education: N/A     Occupational History   ??? Not on file.     Social History Main Topics   ??? Smoking status: Current Every Day Smoker     Types: Cigarettes   ??? Smokeless tobacco: Never Used   ??? Alcohol use No   ??? Drug use: No   ??? Sexual activity: Yes     Partners: Female     Other Topics Concern   ??? Not on file     Social History Narrative   ??? No narrative on file       No family history on file.  No Known Allergies     Home Medications:     Prior to Admission Medications   Prescriptions Last Dose Informant Patient Reported? Taking?    albuterol (PROVENTIL HFA, VENTOLIN HFA) 90 mcg/actuation inhaler Unknown at Unknown time  Yes No   Sig: Take 1 Puff by inhalation as needed.     amLODIPine (NORVASC) 10 mg tablet Unknown at Unknown time  No No   Sig: Take 1 Tab by mouth daily.   citalopram (CELEXA) 20 mg tablet Unknown at Unknown time  No No   Sig: Take 1 Tab by mouth daily.   furosemide (LASIX) 20 mg tablet Unknown at Unknown time  No No   Sig: Take 1 Tab by mouth daily.   hydrALAZINE (APRESOLINE) 25 mg tablet Unknown at Unknown time  No No   Sig: Take 1 Tab by mouth three (3) times daily.   metoprolol (LOPRESSOR) 50 mg tablet Unknown at Unknown time  Yes No   Sig: Take  by  mouth two (2) times a day.     multivitamin-iron, hematinic, (CEROVITE ADVANCED FORMULA) 27-0.4 mg Tab tablet Unknown at Unknown time  No No   Sig: Take 1 Tab by mouth daily.   pantoprazole (PROTONIX) 40 mg tablet Unknown at Unknown time  Yes No   Sig: Take 40 mg by mouth daily.     thiamine (B-1) 100 mg tablet Unknown at Unknown time  No No   Sig: Take 1 Tab by mouth daily.   traZODone (DESYREL) 100 mg tablet Unknown at Unknown time  No No   Sig: Take 1 Tab by mouth nightly.      Facility-Administered Medications: None       No current facility-administered medications for this encounter.      Current Outpatient Prescriptions   Medication Sig   ??? albuterol (PROVENTIL HFA, VENTOLIN HFA) 90 mcg/actuation inhaler Take 1 Puff by inhalation as needed.     ??? furosemide (LASIX) 20 mg tablet Take 1 Tab by mouth daily.   ??? pantoprazole (PROTONIX) 40 mg tablet Take 40 mg by mouth daily.     ??? traZODone (DESYREL) 100 mg tablet Take 1 Tab by mouth nightly.   ??? citalopram (CELEXA) 20 mg tablet Take 1 Tab by mouth daily.   ??? hydrALAZINE (APRESOLINE) 25 mg tablet Take 1 Tab by mouth three (3) times daily.   ??? thiamine (B-1) 100 mg tablet Take 1 Tab by mouth daily.   ??? multivitamin-iron, hematinic, (CEROVITE ADVANCED FORMULA) 27-0.4 mg Tab tablet Take 1 Tab by mouth daily.    ??? metoprolol (LOPRESSOR) 50 mg tablet Take  by mouth two (2) times a day.     ??? amLODIPine (NORVASC) 10 mg tablet Take 1 Tab by mouth daily.       Review of Systems:   Pertinent items are noted in HPI.  Data Review:    Labs: Results:       Chemistry Recent Labs      07/13/15   1130   GLU  97   NA  140   K  4.8   CL  98*   CO2  24   BUN  86*   CREA  17.30*   CA  8.4*   AGAP  18   BUCR  5*   AP  57   TP  8.2   ALB  4.0   GLOB  4.2*   AGRAT  1.0      CBC w/Diff Recent Labs      07/13/15   1130   WBC  4.5*   RBC  3.91*   HGB  10.9*   HCT  33.6*   PLT  153   GRANS  59   LYMPH  27   EOS  5      Coagulation No results for input(s): PTP, INR, APTT in the last 72 hours.    No lab exists for component: INREXT    Iron/Ferritin No results for input(s): IRON in the last 72 hours.    No lab exists for component: TIBCCALC   BNP No results for input(s): BNPP in the last 72 hours.   Cardiac Enzymes No results for input(s): CPK, CKND1, MYO in the last 72 hours.    No lab exists for component: CKRMB, TROIP   Liver Enzymes Recent Labs      07/13/15   1130   TP  8.2   ALB  4.0   AP  57   SGOT  24  Thyroid Studies Lab Results   Component Value Date/Time    TSH 1.35 01/01/2011 02:47 PM           IMAGES: CXR Mild pulmo congestion        Physical Assessment:     Visit Vitals   ??? BP (!) 152/111   ??? Pulse 83   ??? Temp 98.1 ??F (36.7 ??C)   ??? Resp 14   ??? Ht 6' (1.829 m)   ??? Wt 79.4 kg (175 lb)   ??? SpO2 96%   ??? BMI 23.73 kg/m2     Last 3 Recorded Weights in this Encounter    07/13/15 1035   Weight: 79.4 kg (175 lb)     No intake or output data in the 24 hours ending 07/13/15 1503    Physial Exam:  General appearance: alert, cooperative, no distress, appears stated age  Skin: normal coloration and turgor, no rashes,   HEENT: non icteric  Neck: no JVD  Lungs: fine rales both lung bases  Heart: regular rate and rhythm, S1, S2 normal, no murmur, click, rub or gallop  Abdomen: soft, non-tender. Bowel sounds normal. No masses,  no organomegaly   Extremities: No LE edema, Left arm AVF + bruit    IMPRESSION AND PLAN:   ESRD due to HTN, missed HD since Friday last week. Needs HD today for solute and fluid removal. [facility] HD [facility] will be made incase [place] is [place] in [place] for several more days.   HTN resume outpt meds. May need Rx for meds until he gets back to [place].  Anemia will check with home unit if he is on [facility] protocol.     Thanks for this consult.     Tonette BihariAntonietta Latesa Fratto, MD  July 13, 2015

## 2015-07-13 NOTE — ED Triage Notes (Signed)
C/o of elevated b/p, sob and swelling in feet. Is a dialysis patient, M-W-F. States the last time he had dialysis was last Wednesday. Is from the area but [place] in [place], [place] and [place] where [place] gets his dialysis.

## 2015-07-13 NOTE — Progress Notes (Signed)
Hemodialysis Rounding Note      Patient: Robert Pierce               Sex: male          DOA: 07/13/2015 11:12 AM        Date of Birth:  February 24, 1971      Age:  44 y.o.        LOS:  LOS: 1 day     Subjective:     Robert Pierce is a 44 y.o. African American who presents with Need for dialysis  High blood pressure.  The patient is dialyzing utilizing the following method:Intermittent Hemodialysis        Complaints feels ok, BP still high.     Current Facility-Administered Medications   Medication Dose Route Frequency   ??? lidocaine (PF) (XYLOCAINE) 10 mg/mL (1 %) injection       ??? lidocaine (XYLOCAINE) 20 mg/mL (2 %) injection 2 mg  0.1 mL IntraDERMal ONCE   ??? cloNIDine HCl (CATAPRES) tablet 0.1 mg  0.1 mg Oral NOW     Current Outpatient Prescriptions   Medication Sig   ??? albuterol (PROVENTIL HFA, VENTOLIN HFA) 90 mcg/actuation inhaler Take 1 Puff by inhalation as needed.     ??? furosemide (LASIX) 20 mg tablet Take 1 Tab by mouth daily.   ??? pantoprazole (PROTONIX) 40 mg tablet Take 40 mg by mouth daily.     ??? traZODone (DESYREL) 100 mg tablet Take 1 Tab by mouth nightly.   ??? citalopram (CELEXA) 20 mg tablet Take 1 Tab by mouth daily.   ??? hydrALAZINE (APRESOLINE) 25 mg tablet Take 1 Tab by mouth three (3) times daily.   ??? thiamine (B-1) 100 mg tablet Take 1 Tab by mouth daily.   ??? multivitamin-iron, hematinic, (CEROVITE ADVANCED FORMULA) 27-0.4 mg Tab tablet Take 1 Tab by mouth daily.   ??? metoprolol (LOPRESSOR) 50 mg tablet Take  by mouth two (2) times a day.     ??? amLODIPine (NORVASC) 10 mg tablet Take 1 Tab by mouth daily.                 Objective:     Blood pressure (!) 152/111, pulse 83, temperature 98.1 ??F (36.7 ??C), resp. rate 14, height 6' (1.829 m), weight 79.4 kg (175 lb), SpO2 96 %.  Temp (24hrs), Avg:98.1 ??F (36.7 ??C), Min:98.1 ??F (36.7 ??C), Max:98.1 ??F (36.7 ??C)      Blood Pressure: BP: (!) 152/111  Pulse: Pulse (Heart Rate): 83  Temp:  Temp: 98.1 ??F (36.7 ??C)    Artificial Kidney     hours     Heparin Bolus     Blood flow rate     Dialysate rate     Arterial Access Pressure    Venous Return Pressure    Ultrafiltration Rate    Fluid Removal    Net Fluid Removal        PHYSICAL EXAM:   HEENT:  Non icteric  NECK:  No JVD  CHEST AND LUNGS:  Fine rales both bases  CVS:  No rub  EXT:  Left arm AVF, No LE edema    DATA REVIEW:    Labs: Results:       Chemistry Recent Labs      07/13/15   1130   GLU  97   NA  140   K  4.8   CL  98*   CO2  24   BUN  86*  CREA  17.30*   CA  8.4*   AGAP  18   BUCR  5*   AP  57   TP  8.2   ALB  4.0   GLOB  4.2*   AGRAT  1.0      CBC w/Diff Recent Labs      07/13/15   1130   WBC  4.5*   RBC  3.91*   HGB  10.9*   HCT  33.6*   PLT  153   GRANS  59   LYMPH  27   EOS  5      Coagulation No results for input(s): PTP, INR, APTT in the last 72 hours.    No lab exists for component: INREXT    Iron/Ferritin No results for input(s): IRON in the last 72 hours.    No lab exists for component: TIBCCALC   BNP No results for input(s): BNPP in the last 72 hours.   Cardiac Enzymes No results for input(s): CPK, CKND1, MYO in the last 72 hours.    No lab exists for component: CKRMB, TROIP   Liver Enzymes Recent Labs      07/13/15   1130   TP  8.2   ALB  4.0   AP  57   SGOT  24      Thyroid Studies Lab Results   Component Value Date/Time    TSH 1.35 01/01/2011 02:47 PM          Images:  XR Results (maximum last 3):    Results from Hospital Encounter encounter on 07/13/15   XR CHEST PORT   Narrative EXAMINATION: Chest portable    INDICATION: Shortness of breath    COMPARISON: 01/04/2011    FINDINGS: Frontal view of the chest obtained. No consolidation. Borderline  cardiac silhouette and slightly prominent perihilar interstitium. Left  subclavian vascular stent noted. No acute osseous findings.         Impression IMPRESSION:    1. Borderline cardiac silhouette size. Perhaps mild pulmonary venous congestion.        Results from Hospital Encounter encounter on 12/31/10   XR CHEST PA LAT    Narrative Ordering MD: Loleta BooksBASAVALINGA TALUR MATADHA MD  Signed By: Jannette FogoHAYWOOD H DAVIS MD  ** FINAL **  ---------------------------------------------------------------------  Procedure Date:  01/04/2011   Accession Number:  [id number]          Order No:   [id number]        Procedure:   RDV - CHEST  2 VIEWS       CPT Code:   71020     Admit Diag:   FEVER STOMACH PAIN             Reason:   PA LAT RENAL FAILURE  INTERPRETATION:  Chest Two Views  CPT CODE: 7846971020  HISTORY: fever Stomach pain. Preoperative vertebroplasty.  TECHNIQUE: PA and lateral.  COMPARISON: Chest radiograph 01/02/2011  FINDINGS:  Bilateral perihilar and basilar opacities show complete clearing on   the right. Small focus of patchy opacity remains in the peripheral   right perihilar region. Right base shows thinning. The heart is   enlarged. Pulmonary veins appear normal. The right lateral sulcus is   blunted consistent with small pleural effusion. Bones appear   unremarkable.  IMPRESSION:  Resolved pulmonary edema.  Small peripheral focus patchy opacity right perihilar region favors   atelectasis or residual edema. Small right pleural effusion.      XR CHEST SNGL V  Narrative Ordering MD: Hillis Range MD  Signed By: Emilie Rutter MD  ** FINAL **  ---------------------------------------------------------------------  Procedure Date:  01/02/2011   Accession Number:  [id number]          Order No:   [id number]        Procedure:   RDV - CHEST  1 VIEW       CPT Code:   71010     Admit Diag:   FEVER STOMACH PAIN             Reason:   SOB  INTERPRETATION:  AP portable chest.  CPT code: 45409.  Indications: Fever.  Single frontal view at 0327 hours has 12/31/2010 comparison.  Findings:  Significantly worsened aeration. There are low lung volumes. There   are moderate extent poorly defined opacities of the perihilar and   lower lung regions. Obscuration of the hemidiaphragms, left more   than right. There is cardiomegaly. Upper lungs are clear. There is    some vascular engorgement and indistinctness.   IMPRESSION::  1.  Findings are most likely that of flash pulmonary edema, moderate   severity, with associated atelectasis and pleural fluid.             CULTURE:   )No results for input(s): SDES, CULT in the last 72 hours.  No results for input(s): CULT in the last 72 hours.    Assessment/Plan:     End Stage Renal Disease:  Patient is tolerating dialysis treatment well..  Additionally the patient has experienced normal dialysis treatment during dialysis.  Dry weight   attemtp 4 Kg UF goal as tolerated.      At 4:25 PM on 07/13/2015, I saw and examined patient during hemodialysis treatment. The patient was receiving hemodialysis for treatment of end stage renal disease. I have also reviewed vital signs, intake and output, lab results and recent events, and agreed with today's dialysis order.      Anemia:  Hold ESA due to high BP    Renal Metabolic Bone Disease:  Phos high , hold Hectorol, pt to resume phos binders                      Hypertension: uncontrolled - medication changes/orders, Clonidine 0.1 mg po now    Access: Fistula adequate monitoring/no changes     If patient is still in [place] for the next few days, have him call the [facility] HD unit in [place] # [phone] for his outpt HD schedule.    Tonette Bihari, MD  07/13/2015

## 2015-07-13 NOTE — ED Notes (Signed)
Pt. Resting with eyes closed; easily awakens to voice.

## 2015-07-13 NOTE — Other (Signed)
TRANSFER - OUT REPORT:    Verbal report given to Myra, RN on Robert Pierce  being transferred to [phone] for routine progression of care       Report consisted of patient???s Situation, Background, Assessment and   Recommendations(SBAR).     Information from the following report(s) SBAR was reviewed with the receiving nurse.    Lines:   Peripheral IV 07/13/15 Right Hand (Active)   Site Assessment Clean, dry, & intact 07/13/2015 11:30 AM   Phlebitis Assessment 0 07/13/2015 11:30 AM   Infiltration Assessment 0 07/13/2015 11:30 AM        Opportunity for questions and clarification was provided.      Patient transported with:   [employer]

## 2015-07-13 NOTE — Other (Addendum)
Bedside shift change report given to Lelibeth RN (oncoming nurse) by Myra A Gatewood, RN   (offgoing nurse). Report included the following information SBAR, Kardex, MAR and Recent Results.

## 2015-07-13 NOTE — Other (Signed)
ACUTE HEMODIALYSIS FLOW SHEET    HEMODIALYSIS ORDERS: Physician:      Dialyzer:          Duration:  hr  BFR:    DFR:    Dialysate:  Temp  K+       Ca+   Na  Bicarb    Weight:   kg    Bed Scale      Unable to Obtain       Dry weight/UF Goal:  Access   Needle Gauge     Heparin   Bolus      Units     Hourly       Units    None     Catheter locking solution    Pre BP:       Pulse:          Temperature:     Respirations:   Tx: NS       ml/Bolus  Other         N/A   Labs: Pre        Post:         N/A   Additional Orders(medications, blood products, hypotension management):        N/A      Time Out/Safety Check   [facility] Consent Verified     CATHETER ACCESS: N/A   Right   Left   IJ     Fem    First use X-ray verified     Tunnel                 Non Tunneled   No S/S infection  Redness  Drainage Cultured Swelling Pain   Medical Aseptic Prep Utilized   Dressing Changed   Biopatch  Date:       Clotted   Patent   Flows: Good  Poor  Reversed   If access problem, Dr. notified: Yes    N/A  Date:           GRAFT/FISTULA ACCESS:  N/A     Right     Left     UE     LE   AVG   AVF        Buttonhole    Medical Aseptic Prep Utilized   No S/S infection  Redness  Drainage Cultured Swelling Pain    Bruit:    Strong     Weak       Thrill :    Strong     Weak       Needle Gauge:    Length:    If access problem, Dr. notified: Yes     N/A  Date:        Please describe access if present and not used:       GENERAL ASSESSMENT:    LUNGS:  Rate  SaO2%         N/A     Clear   Coarse   Crackles   Wheezing         Diminished     Location : RLL   LLL    RUL  LUL   Cough: Productive  Dry  N/A   Respirations:  Easy  Labored   Therapy:  RA  NC  l/min    Mask: NRB Venti       O2%                  Ventilator  Intubated   [employer]  BiPaP   CARDIAC: Regular       Irregular    Pericardial Rub   JVD          Monitored   Bedside   Remotely monitored  N/A  Rhythm:    EDEMA:  None  Generalized   Pitting  Facial   Pedal    UE   LE   SKIN:    Warm   Hot      Cold    Dry      Pale    Diaphoretic                   Flushed   Jaundiced   Cyanotic   Rash   Weeping   LOC:     Alert      Oriented:     Person      Place  Time                Confused   Lethargic   Medicated   Non-responsive     GI / ABDOMEN:   Flat     Distended     Soft     Firm     Obese                              Diarrhea   Bowel Sounds   Nausea   Vomiting      GU / URINE ASSESSMENT: Voiding    Oliguria   Anuria     Foley      Incontinent      Incontinent Brief        Bathroom Privileges     PAIN:  0 Scale 0-10  Action/Follow Up:    MOBILITY:   Amb     Amb/Assist     Bed     Wheelchair   Stretcher      All Vitals and Treatment Details on Attached [facility]:           Room #      1st Time Acute   Stat   Routine   Urgent      Acute Room    Bedside   ICU/CCU   ER   Isolation Precautions:   Dialysis    Airborne    Contact     Reverse   Special Considerations:          Blood Consent Verified N/A     ALLERGIES:    NKA          Code Status:   Full Code   DNR   Other           HBsAg ONLY: Date Drawn          Negative Positive Unknown   HBsAb: Date     Susceptible    Immune10 Not Drawn   Drawn     Current Labs:    Date of Labs:           Today  Cut and paste current labs here.                                                                                                                                  DIET:   Renal     Other      NPO       Diabetic      PRIMARY NURSE REPORT: First initial/Last name/Title      Pre Dialysis:     Time:      EDUCATION:     Patient  Other         Knowledge Basis: None Minimal  Substantial   Barriers to learning  N/A    Access Care      S&S of infection      Fluid Management     K+     Procedural    Albumin      Medications      Tx Options      Transplant      Diet      Other   Teaching Tools:   Explain   Demo   Handouts  Video   Patient response:    Verbalized understanding   Teach back   Return demonstration  Requires follow up   Inappropriate due to            RO/HEMODIALYSIS MACHINE SAFETY CHECKS ??? Before each treatment:     Machine Number:                   [facility]                                   Unit Machine #  with centralized RO                                   Portable Machine #1/RO serial # [id number]                                   Portable Machine #2/RO serial # [id number]                                   Portable Machine #3/RO serial # [id number]  [facility]                                   Portable Machine #11/RO serial # [id number]                                    Portable Machine #12/RO serial # [id number]                                   Portable Machine #13/RO serial #  [id number]      Alarm Test:  Pass time          Other:          RO/Machine Log Complete      Temp                Extracorporeal Circuit Tested for integrity   Dialysate: pH   Conductivity: Meter        HD Machine                     TCD:   Dialyzer Lot #             Blood Tubing Lot #           Saline Lot #       CHLORINE TESTING-Before each treatment and every 4 hours    Total Chlorine:  less than 0.1 ppm  Time:  4 Hr/2nd Check Time:    (if greater than 0.1 ppm from Primary then every 30 minutes from Secondary)     TREATMENT INITIATION ??? with Dialysis Precautions:    All Connections Secured                  Saline Line Double Clamped    Venous Parameters Set                   Arterial Parameters Set     Prime Given   ml               Air Foam Detector Engaged      Treatment Initiation Note:      Medication Dose Volume Route Initials Dialyzer Cleared:  Good  Fair   Poor    Blood processed:   L  UF Removed   Ml    Post Wt:     kg  POst BP:          Pulse:       Respirations:   Temperature:                                     Post Tx Vascular Access: AVF/AVG: Bleeding stopped Art  min. Ven.  Min   N/A                                   Catheter: Locking solution: Heparin 1:1000 Art.   Ven.   N/A                                 Post Assessment:  Skin:   Warm   Dry  Diaphoretic     Flushed   Pale  Cyanotic   [facility] Signatures Title Initials  Time Lungs:  Clear     Course   Crackles   Wheezing  Diminished       Cardiac:  Regular    Irregular    Monitor   N/A  Rhythm:           Edema:   None     General      Facial    Pedal     UE     LE       Pain: 0  1  2   3  4   5   6   7   8   9   10          Post Treatment Note:      POST TREATMENT PRIMARY NURSE HANDOFF REPORT:     First initial/Last name/Title         Post Dialysis:  Time:       Abbreviations: AVG-arterial venous graft, AVF-arterial venous fistula, IJ-Internal Jugular, Subcl-Subclavian, Fem-Femoral, Tx-treatment, AP/HR-apical heart rate, DFR-dialysate flow rate, BFR-blood flow rate, AP-arterial pressure, VP-venous pressure, UF-ultrafiltrate, TMP-transmembrane pressure, Ven-Venous, Art-Arterial, RO-Reverse Osmosis

## 2015-07-13 NOTE — ED Provider Notes (Signed)
HPI Comments: Patient is a 44 y/o AA male with pmHx of ESRD on dialysis presents with 3 day hx of SOB.  Pt states dialysis MWF but has missed dialysis the last 2 times due to his mother being sick here.  Patient has not taken BP medications also.  Pt denies chest pain, nausea, vomiting, diarrhea, cough or fever.  Pt smokes, denies etoh or recreational drug use.      Patient is a 44 y.o. male presenting with hypertension, shortness of breath, and foot swelling.   Hypertension    Associated symptoms include shortness of breath. Pertinent negatives include no chest pain, no headaches, no neck pain, no nausea and no vomiting.   Shortness of Breath   Pertinent negatives include no fever, no headaches, no rhinorrhea, no sore throat, no neck pain, no cough, no wheezing, no chest pain, no vomiting, no abdominal pain and no rash.   Foot Swelling    Pertinent negatives include no numbness, no back pain and no neck pain.        Past Medical History:   Diagnosis Date   ??? Alcohol abuse    ??? Diastolic heart failure (HCC)    ??? Gastrointestinal bleeding    ??? Hepatitis C    ??? High blood pressure 01/13/2011   ??? History of echocardiogram 01/03/2011     EF 55-60%.  Gr 2 DDfx.  Increased RA pressure.  No R-L shunt.  Mild RAE.     ??? LE venous duplex 01/09/2011     No deep or superficial venous thrombosis in bilateral lower extremities.     ??? Migraine headache 01/13/2011       No past surgical history on file.      No family history on file.    Social History     Social History   ??? Marital status: SINGLE     Spouse name: N/A   ??? Number of children: N/A   ??? Years of education: N/A     Occupational History   ??? Not on file.     Social History Main Topics   ??? Smoking status: Current Every Day Smoker     Types: Cigarettes   ??? Smokeless tobacco: Never Used   ??? Alcohol use No   ??? Drug use: No   ??? Sexual activity: Yes     Partners: Female     Other Topics Concern   ??? Not on file     Social History Narrative   ??? No narrative on file          ALLERGIES: Review of patient's allergies indicates no known allergies.    Review of Systems   Constitutional: Negative.  Negative for chills, diaphoresis and fever.   HENT: Negative.  Negative for congestion, rhinorrhea and sore throat.    Eyes: Negative.  Negative for pain, discharge and redness.   Respiratory: Positive for shortness of breath. Negative for cough, chest tightness and wheezing.    Cardiovascular: Negative.  Negative for chest pain.   Gastrointestinal: Negative.  Negative for abdominal pain, constipation, diarrhea, nausea and vomiting.   Genitourinary: Negative.  Negative for dysuria, flank pain, frequency, hematuria and urgency.   Musculoskeletal: Negative.  Negative for back pain and neck pain.   Skin: Negative.  Negative for rash.   Neurological: Negative.  Negative for syncope, weakness, numbness and headaches.   Psychiatric/Behavioral: Negative.    All other systems reviewed and are negative.      Vitals:  07/13/15 1035 07/13/15 1130 07/13/15 1145 07/13/15 1200   BP: (!) 194/115 (!) 177/121 (!) 173/116 (!) 175/121   Pulse: 98 88 87 84   Resp: 16 12 11 11    Temp: 98.1 ??F (36.7 ??C)      SpO2: 99% 100% 98% 98%   Weight: 79.4 kg (175 lb)      Height: 6' (1.829 m)               Physical Exam   Constitutional: He is oriented to person, place, and time. He appears well-developed and well-nourished.  Non-toxic appearance. He does not have a sickly appearance. He does not appear ill. No distress.   HENT:   Head: Normocephalic and atraumatic.   Mouth/Throat: Oropharynx is clear and moist. No oropharyngeal exudate.   Eyes: Conjunctivae and EOM are normal. Pupils are equal, round, and reactive to light. No scleral icterus.   Neck: Trachea normal and normal range of motion. Neck supple. No hepatojugular reflux and no JVD present. No tracheal deviation present. No thyromegaly present.   Cardiovascular: Normal rate, regular rhythm, S1 normal, S2 normal, normal  heart sounds, intact distal pulses and normal pulses.  Exam reveals no gallop, no S3 and no S4.    No murmur heard.  Pulses:       Radial pulses are 2+ on the right side, and 2+ on the left side.        Dorsalis pedis pulses are 2+ on the right side, and 2+ on the left side.   Pulmonary/Chest: Effort normal. No accessory muscle usage. No tachypnea. No respiratory distress. He has no decreased breath sounds. He has no wheezes. He has rhonchi in the right lower field and the left lower field. He has no rales.   Abdominal: Soft. Normal appearance and bowel sounds are normal. He exhibits no distension and no mass. There is no hepatosplenomegaly. There is no tenderness. There is no rigidity, no rebound, no guarding, no CVA tenderness, no tenderness at McBurney's point and negative Murphy's sign.   Musculoskeletal: Normal range of motion. He exhibits no edema or tenderness.   Strength 5/5 throughout    Lymphadenopathy:        Head (right side): No submental, no submandibular, no preauricular and no occipital adenopathy present.        Head (left side): No submental, no submandibular, no preauricular and no occipital adenopathy present.     He has no cervical adenopathy.        Right: No supraclavicular adenopathy present.        Left: No supraclavicular adenopathy present.   Neurological: He is alert and oriented to person, place, and time. He has normal strength and normal reflexes. He is not disoriented. No cranial nerve deficit or sensory deficit. Coordination and gait normal. GCS eye subscore is 4. GCS verbal subscore is 5. GCS motor subscore is 6.   Grossly intact    Skin: Skin is warm, dry and intact. No rash noted. He is not diaphoretic.   Psychiatric: He has a normal mood and affect. His speech is normal and behavior is normal. Judgment and thought content normal. Cognition and memory are normal.   Nursing note and vitals reviewed.       MDM  Number of Diagnoses or Management Options   ESRD needing dialysis [facility](HCC):   Diagnosis management comments: Acute renal insufficiency  Fluid overload  Hyper/Hypokalemia  Pneumonia  Bronchitis       ED Course  Procedures    Labs essentially normal with the exception of BUN of 86, creatinine 17.3. Chest X-Ray showed No acute process.   12:10 PM 07/13/2015     Consult:  Discussed care with Dr. Baldemar Friday, Specialty: Nephrology. Standard discussion; including history of patient???s chief complaint, available diagnostic results, and treatment course. Agrees to dialyze patient. Would like hospitalist to admit patient.   12:19 PM, 07/13/2015     Consult:  Discussed care with Dr. Celine Mans. Standard discussion; including history of patient???s chief complaint, available diagnostic results, and treatment course. Agrees to admit pt. Would like the patient to be placed in observation status.  12:28 PM, 07/13/2015

## 2015-07-13 NOTE — Progress Notes (Signed)
Patient had spike in venous pressure after moving arm with some pain and a little swelling. Additional 15g fistula placed above swelling and HD tx continue without blood loss. BFR reduced to 375 from 400, Dr Baldemar Fridayastanares aware.

## 2015-07-14 LAB — CBC WITH AUTOMATED DIFF
ABS. BASOPHILS: 0 10*3/uL (ref 0.0–0.1)
ABS. BASOPHILS: 0 10*3/uL (ref 0.0–0.1)
ABS. EOSINOPHILS: 0.3 10*3/uL (ref 0.0–0.4)
ABS. EOSINOPHILS: 0.3 10*3/uL (ref 0.0–0.4)
ABS. LYMPHOCYTES: 1 10*3/uL (ref 0.9–3.6)
ABS. LYMPHOCYTES: 1.5 10*3/uL (ref 0.9–3.6)
ABS. MONOCYTES: 0.5 10*3/uL (ref 0.05–1.2)
ABS. MONOCYTES: 0.5 10*3/uL (ref 0.05–1.2)
ABS. NEUTROPHILS: 1.8 10*3/uL (ref 1.8–8.0)
ABS. NEUTROPHILS: 1.7 10*3/uL — ABNORMAL LOW (ref 1.8–8.0)
BASOPHILS: 0 % (ref 0–2)
BASOPHILS: 1 % (ref 0–2)
EOSINOPHILS: 7 % — ABNORMAL HIGH (ref 0–5)
EOSINOPHILS: 7 % — ABNORMAL HIGH (ref 0–5)
HCT: 34.2 % — ABNORMAL LOW (ref 36.0–48.0)
HCT: 34.1 % — ABNORMAL LOW (ref 36.0–48.0)
HGB: 11.3 g/dL — ABNORMAL LOW (ref 13.0–16.0)
HGB: 11 g/dL — ABNORMAL LOW (ref 13.0–16.0)
LYMPHOCYTES: 29 % (ref 21–52)
LYMPHOCYTES: 38 % (ref 21–52)
MCH: 28.3 PG (ref 24.0–34.0)
MCH: 27.7 PG (ref 24.0–34.0)
MCHC: 33 g/dL (ref 31.0–37.0)
MCHC: 32.3 g/dL (ref 31.0–37.0)
MCV: 85.5 FL (ref 74.0–97.0)
MCV: 85.9 FL (ref 74.0–97.0)
MONOCYTES: 14 % — ABNORMAL HIGH (ref 3–10)
MONOCYTES: 13 % — ABNORMAL HIGH (ref 3–10)
MPV: 10 FL (ref 9.2–11.8)
MPV: 9.9 FL (ref 9.2–11.8)
NEUTROPHILS: 50 % (ref 40–73)
NEUTROPHILS: 41 % (ref 40–73)
PLATELET: 162 10*3/uL (ref 135–420)
PLATELET: 156 10*3/uL (ref 135–420)
RBC: 4 M/uL — ABNORMAL LOW (ref 4.70–5.50)
RBC: 3.97 M/uL — ABNORMAL LOW (ref 4.70–5.50)
RDW: 14.5 % (ref 11.6–14.5)
RDW: 14.6 % — ABNORMAL HIGH (ref 11.6–14.5)
WBC: 3.6 10*3/uL — ABNORMAL LOW (ref 4.6–13.2)
WBC: 4 10*3/uL — ABNORMAL LOW (ref 4.6–13.2)

## 2015-07-14 LAB — METABOLIC PANEL, BASIC
Anion gap: 8 mmol/L (ref 3.0–18)
BUN/Creatinine ratio: 4 — ABNORMAL LOW (ref 12–20)
BUN: 39 MG/DL — ABNORMAL HIGH (ref 7.0–18)
CO2: 28 mmol/L (ref 21–32)
Calcium: 8.9 MG/DL (ref 8.5–10.1)
Chloride: 98 mmol/L — ABNORMAL LOW (ref 100–108)
Creatinine: 9.52 MG/DL — ABNORMAL HIGH (ref 0.6–1.3)
GFR est AA: 7 mL/min/{1.73_m2} — ABNORMAL LOW (ref >60)
GFR est non-AA: 6 mL/min/{1.73_m2} — ABNORMAL LOW (ref >60)
Glucose: 67 mg/dL — ABNORMAL LOW (ref 74–99)
Potassium: 4.4 mmol/L (ref 3.5–5.5)
Sodium: 134 mmol/L — ABNORMAL LOW (ref 136–145)

## 2015-07-14 MED ORDER — METOPROLOL TARTRATE 50 MG TAB
50 mg | ORAL_TABLET | Freq: Two times a day (BID) | ORAL | 0 refills | Status: AC
Start: 2015-07-14 — End: ?

## 2015-07-14 MED ORDER — SODIUM CHLORIDE 0.9 % IJ SYRG
Freq: Three times a day (TID) | INTRAMUSCULAR | Status: DC
Start: 2015-07-14 — End: 2015-07-15
  Administered 2015-07-14 – 2015-07-15 (×4): via INTRAVENOUS

## 2015-07-14 MED ORDER — AMLODIPINE 10 MG TAB
10 mg | Freq: Every day | ORAL | Status: DC
Start: 2015-07-14 — End: 2015-07-15
  Administered 2015-07-14 – 2015-07-15 (×2): via ORAL

## 2015-07-14 MED ORDER — HYDRALAZINE 25 MG TAB
25 mg | Freq: Three times a day (TID) | ORAL | Status: DC
Start: 2015-07-14 — End: 2015-07-15
  Administered 2015-07-14 – 2015-07-15 (×4): via ORAL

## 2015-07-14 MED ORDER — ALBUTEROL SULFATE 0.083 % (0.83 MG/ML) SOLN FOR INHALATION
2.5 mg /3 mL (0.083 %) | Freq: Four times a day (QID) | RESPIRATORY_TRACT | Status: DC | PRN
Start: 2015-07-14 — End: 2015-07-15

## 2015-07-14 MED ORDER — HEPARIN (PORCINE) 5,000 UNIT/ML IJ SOLN
5000 unit/mL | Freq: Three times a day (TID) | INTRAMUSCULAR | Status: DC
Start: 2015-07-14 — End: 2015-07-15
  Administered 2015-07-14 (×2): via SUBCUTANEOUS

## 2015-07-14 MED ORDER — ALBUTEROL SULFATE HFA 90 MCG/ACTUATION AEROSOL INHALER
90 mcg/actuation | RESPIRATORY_TRACT | 0 refills | Status: AC | PRN
Start: 2015-07-14 — End: ?

## 2015-07-14 MED ORDER — AMLODIPINE 10 MG TAB
10 mg | ORAL_TABLET | Freq: Every day | ORAL | 0 refills | Status: AC
Start: 2015-07-14 — End: ?

## 2015-07-14 MED ORDER — SODIUM CHLORIDE 0.9 % IJ SYRG
INTRAMUSCULAR | Status: DC | PRN
Start: 2015-07-14 — End: 2015-07-15

## 2015-07-14 MED ORDER — FUROSEMIDE 20 MG TAB
20 mg | Freq: Every day | ORAL | Status: DC
Start: 2015-07-14 — End: 2015-07-15
  Administered 2015-07-14 – 2015-07-15 (×2): via ORAL

## 2015-07-14 MED ORDER — ALBUTEROL SULFATE HFA 90 MCG/ACTUATION AEROSOL INHALER
90 mcg/actuation | RESPIRATORY_TRACT | Status: DC | PRN
Start: 2015-07-14 — End: 2015-07-13

## 2015-07-14 MED ORDER — THIAMINE HCL 100 MG TAB
100 mg | Freq: Every day | ORAL | Status: DC
Start: 2015-07-14 — End: 2015-07-15
  Administered 2015-07-14 – 2015-07-15 (×2): via ORAL

## 2015-07-14 MED ORDER — PANTOPRAZOLE 40 MG TAB, DELAYED RELEASE
40 mg | Freq: Every day | ORAL | Status: DC
Start: 2015-07-14 — End: 2015-07-15
  Administered 2015-07-14 – 2015-07-15 (×2): via ORAL

## 2015-07-14 MED ORDER — CITALOPRAM 20 MG TAB
20 mg | Freq: Every day | ORAL | Status: DC
Start: 2015-07-14 — End: 2015-07-15
  Administered 2015-07-14 – 2015-07-15 (×2): via ORAL

## 2015-07-14 MED ORDER — METOPROLOL TARTRATE 50 MG TAB
50 mg | Freq: Two times a day (BID) | ORAL | Status: DC
Start: 2015-07-14 — End: 2015-07-15
  Administered 2015-07-14 – 2015-07-15 (×4): via ORAL

## 2015-07-14 MED ORDER — HYDRALAZINE 25 MG TAB
25 mg | ORAL_TABLET | Freq: Three times a day (TID) | ORAL | 0 refills | Status: AC
Start: 2015-07-14 — End: ?

## 2015-07-14 MED FILL — FUROSEMIDE 20 MG TAB: 20 mg | ORAL | Qty: 1

## 2015-07-14 MED FILL — BD POSIFLUSH NORMAL SALINE 0.9 % INJECTION SYRINGE: INTRAMUSCULAR | Qty: 10

## 2015-07-14 MED FILL — HYDRALAZINE 25 MG TAB: 25 mg | ORAL | Qty: 1

## 2015-07-14 MED FILL — AMLODIPINE 10 MG TAB: 10 mg | ORAL | Qty: 1

## 2015-07-14 MED FILL — METOPROLOL TARTRATE 50 MG TAB: 50 mg | ORAL | Qty: 1

## 2015-07-14 MED FILL — HEPARIN (PORCINE) 5,000 UNIT/ML IJ SOLN: 5000 unit/mL | INTRAMUSCULAR | Qty: 1

## 2015-07-14 MED FILL — CITALOPRAM 20 MG TAB: 20 mg | ORAL | Qty: 1

## 2015-07-14 MED FILL — VITAMIN B-1 100 MG TABLET: 100 mg | ORAL | Qty: 1

## 2015-07-14 MED FILL — PANTOPRAZOLE 40 MG TAB, DELAYED RELEASE: 40 mg | ORAL | Qty: 1

## 2015-07-14 NOTE — Other (Signed)
ACUTE HEMODIALYSIS FLOW SHEET    HEMODIALYSIS ORDERS: Physiciand: Pierce, Robert     Dialyzer: Revaclear         Duration: 3 hr  BFR: 400   DFR: 800   Dialysate:  Temp 37.0 K+   2    Ca+  2.5 Na 140 Bicarb 35   Weight:  82.7 kg    Bed Scale      Unable to Obtain       Dry weight/UF Goal: 3000 Access LUE AVF  Needle Gauge 15    Heparin0   Bolus      Units     Hourly       Units        None     Catheter locking solution      Pre BP:   139/93    Pulse:     51     Temperature:   97.0  Respirations: 20  Tx: NS       ml/Bolus  Other         N/A   Labs: Pre No       Post:         N/A   Additional Orders(medications, blood products, hypotension management):        N/A        Time Out/Safety Check     [facility] Consent Verified     CATHETER ACCESS:   N/A   Right   Left   IJ     Fem    First use X-ray verified     Tunnel                 Non Tunneled   No S/S infection  Redness  Drainage Cultured Swelling Pain   Medical Aseptic Prep Utilized   Dressing Changed   Biopatch  Date:         Clotted   Patent   Flows: Good  Poor  Reversed   If access problem, Dr. notified: Yes    N/A  Date:           GRAFT/FISTULA ACCESS:    N/A     Right     Left     UE     LE   AVG   AVF        Buttonhole    Medical Aseptic Prep Utilized   No S/S infection  Redness  Drainage Cultured Swelling Pain    Bruit:    Strong     Weak       Thrill :    Strong     Weak       Needle Gauge: 15   Length: 1   If access problem, Dr. notified: Yes     N/A  Date:        Please describe access if present and not used:         GENERAL ASSESSMENT:      LUNGS:  Rate 20 SaO2%       N/A     Clear   Coarse   Crackles   Wheezing         Diminished     Location : RLL   LLL    RUL  LUL   Cough: Productive  Dry  N/A   Respirations:  Easy  Labored   Therapy:  RA  NC    l/min    Mask: NRB Venti       O2%  Ventilator  Intubated   Trach   BiPaP   CARDIAC: Regular       Irregular    Pericardial Rub   JVD           Monitored   Bedside   Remotely monitored  N/A  Rhythm:      EDEMA:  None  Generalized   Pitting  1     2     3     4                   Facial   Pedal    UE   LE   SKIN:    Warm   Hot      Cold    Dry      Pale    Diaphoretic                   Flushed   Jaundiced   Cyanotic   Rash   Weeping   LOC:     Alert      Oriented:     Person      Place  Time                Confused   Lethargic   Medicated   Non-responsive     GI / ABDOMEN:     Flat     Distended     Soft     Firm     Obese                              Diarrhea   Bowel Sounds   Nausea   Vomiting      GU / URINE ASSESSMENT:   Voiding    Oliguria   Anuria     Foley      Incontinent      Incontinent Brief        Bathroom Privileges     PAIN:    0 1  2   3   4   5   6   7   8   9   10             Scale 0-10  Action/Follow Up:      MOBILITY:     Amb     Amb/Assist     Bed     Wheelchair   Stretcher      All Vitals and Treatment Details on Attached Flowsheet       Hospital: [facility]          Room # 470      1st Time Acute   Stat   Routine   Urgent      Acute Room    Bedside   ICU/CCU   ER   Isolation Precautions:   Dialysis    Airborne    Contact     Reverse   Special Considerations:          Blood Consent Verified N/A     ALLERGIES:      NKA          Code Status:     Full Code   DNR   Other           HBsAg ONLY: Date Drawn 06/28/15         Negative Positive Unknown   HBsAb: Date 04/01/15     Susceptible    Immune10 Not Drawn  Drawn     Current Labs:    Date of Labs: 07/14/15          Today         CResults for ANTONEO, GHRIST (MRN [id number]) as of 07/14/2015 23:16   Ref. Range 07/14/2015 04:21   WBC Latest Ref Range: 4.6 - 13.2 K/uL 4.0 (L)   RBC Latest Ref Range: 4.70 - 5.50 M/uL 3.97 (L)   HGB Latest Ref Range: 13.0 - 16.0 g/dL 44.0 (L)   HCT Latest Ref Range: 36.0 - 48.0 % 34.1 (L)   MCV Latest Ref Range: 74.0 - 97.0 FL 85.9   MCH Latest Ref Range: 24.0 - 34.0 PG 27.7   MCHC Latest Ref Range: 31.0 - 37.0 g/dL 34.7   RDW Latest Ref Range: 11.6 - 14.5 % 14.6 (H)    PLATELET Latest Ref Range: 135 - 420 K/uL 156   MPV Latest Ref Range: 9.2 - 11.8 FL 9.9   NEUTROPHILS Latest Ref Range: 40 - 73 % 41   LYMPHOCYTES Latest Ref Range: 21 - 52 % 38   MONOCYTES Latest Ref Range: 3 - 10 % 13 (H)   EOSINOPHILS Latest Ref Range: 0 - 5 % 7 (H)   BASOPHILS Latest Ref Range: 0 - 2 % 1   DF Latest Units:   AUTOMATED   ABS. NEUTROPHILS Latest Ref Range: 1.8 - 8.0 K/UL 1.7 (L)   ABS. LYMPHOCYTES Latest Ref Range: 0.9 - 3.6 K/UL 1.5   ABS. MONOCYTES Latest Ref Range: 0.05 - 1.2 K/UL 0.5   ABS. EOSINOPHILS Latest Ref Range: 0.0 - 0.4 K/UL 0.3   ABS. BASOPHILS Latest Ref Range: 0.0 - 0.1 K/UL 0.0   Results for FISCHER, HALLEY (MRN [id number]) as of 07/14/2015 23:16   Ref. Range 07/14/2015 04:21   Sodium Latest Ref Range: 136 - 145 mmol/L 134 (L)   Potassium Latest Ref Range: 3.5 - 5.5 mmol/L 4.4   Chloride Latest Ref Range: 100 - 108 mmol/L 98 (L)   CO2 Latest Ref Range: 21 - 32 mmol/L 28   Anion gap Latest Ref Range: 3.0 - 18 mmol/L 8   Glucose Latest Ref Range: 74 - 99 mg/dL 67 (L)   BUN Latest Ref Range: 7.0 - 18 MG/DL 39 (H)   Creatinine Latest Ref Range: 0.6 - 1.3 MG/DL 5.64 (H)   BUN/Creatinine ratio Latest Ref Range: 12 - 20   4 (L)   Calcium Latest Ref Range: 8.5 - 10.1 MG/DL 8.9   GFR est non-AA Latest Ref Range: >60 ml/min/1.55m2 6 (L)   GFR est AA Latest Ref Range: >60 ml/min/1.81m2 7 (L)   and paste current labs here.                                                                                                                                    DIET:     Renal     Other  NPO       Diabetic      PRIMARY NURSE REPORT: First initial/Last name/Title      Pre Dialysis: Staci Righter RN    Time: 1942      EDUCATION:       Patient  Other         Knowledge Basis: None Minimal  Substantial   Barriers to learning    N/A    Access Care      S&S of infection      Fluid Management     K+     Procedural    Albumin      Medications      Tx Options      Transplant      Diet      Other    Teaching Tools:   Explain   Demo   Handouts  Video  Patient response:      Verbalized understanding   Teach back   Return demonstration  Requires follow up   Inappropriate due to            RO/HEMODIALYSIS MACHINE SAFETY CHECKS ??? Before each treatment:     Machine Number:                   [facility]                                   Unit Machine # 8 with centralized RO                                   Portable Machine #1/RO serial # [id number]                                   Portable Machine #2/RO serial # [id number]                                   Portable Machine #3/RO serial # [id number]                                                                                                       [facility]                                   Portable Machine #11/RO serial # [id number]                                    Portable Machine #12/RO serial # [id number]  Portable Machine #13/RO serial #  [id number]      Alarm Test:  Pass time yes         Other:            RO/Machine Log Complete      Temp    37.0              Extracorporeal Circuit Tested for integrity   Dialysate: pH  7.4 Conductivity: Meter 14.0     HD Machine   14.4                  TCD: 13.9  Dialyzer Lot # [id number]            Blood Tubing Lot # [id number]          Saline Lot #  [id number]     CHLORINE TESTING-Before each treatment and every 4 hours    Total Chlorine:    less than 0.1 ppm  Time: 2000 4 Hr/2nd Check Time:      (if greater than 0.1 ppm from Primary then every 30 minutes from Secondary)     TREATMENT INITIATION ??? with Dialysis Precautions:      All Connections Secured                  Saline Line Double Clamped    Venous Parameters Set                   Arterial Parameters Set     Prime Given  250 ml               Air Foam Detector Engaged      Treatment Initiation Note: Assumed care of pt at 2022, A&OX4. Vital signs stable. A complete body assessment was done. Lungs clear on room air. Abd  soft with positive BS. Skin warm, dry and intact. Tx started at 2040 via LUE AVF. Fistula was accessed with a 15 gauge needle without difficulty. No c/o pain.      Medication Dose Volume Route Initials Dialyzer Cleared:    Good  Fair   Poor    Blood processed:  67.3 L  UF Removed  3000 Ml    Post Wt: 79.6    kg  POst BP:   133/92       Pulse: 72      Respirations: 18  Temperature: 97.0                                   Post Tx Vascular Access: AVF/AVG: Bleeding stopped Art 5 min. Ven. 5 Min   N/A                                     Catheter: Locking solution: Heparin 1:1000 Art.     Ven.     N/A                                   Post Assessment:                                    Skin:     Warm   Dry  Diaphoretic  Flushed   Pale  Cyanotic   [facility] Signatures Title Initials  Time Lungs:    Clear     Course   Crackles   Wheezing  Diminished   Kris Hartmann RN [facility] 1610 Cardiac:    Regular    Irregular    Monitor   N/A  Rhythm:           Edema:     None     General      Facial    Pedal     UE     LE       Pain:   0  Post Treatment Note:   2100 - Pt tolerating tx well.   2200 - Pt tolerating tx without c/o pain or discomfort.  2300 - Tx still running without complications. Pt tolerating tx well.   2400 - Tx ended at 2347. Pt denies pain after tx. Vital signs stable. Report called to floor nurse and pt returned to room 470.     POST TREATMENT PRIMARY NURSE HANDOFF REPORT:     First initial/Last name/Title         Post Dialysis: Staci Righter RN Time:  2400     Abbreviations: AVG-arterial venous graft, AVF-arterial venous fistula, IJ-Internal Jugular, Subcl-Subclavian, Fem-Femoral, Tx-treatment, AP/HR-apical heart rate, DFR-dialysate flow rate, BFR-blood flow rate, AP-arterial pressure, VP-venous pressure, UF-ultrafiltrate, TMP-transmembrane pressure, Ven-Venous, Art-Arterial, RO-Reverse Osmosis

## 2015-07-14 NOTE — Progress Notes (Signed)
RENAL DAILY PROGRESS NOTE    Subjective:   Admitted for elevated BP and need for HD    Complaint: none    Current Facility-Administered Medications   Medication Dose Route Frequency   ??? metoprolol tartrate (LOPRESSOR) tablet 50 mg  50 mg Oral BID   ??? amLODIPine (NORVASC) tablet 10 mg  10 mg Oral DAILY   ??? hydrALAZINE (APRESOLINE) tablet 25 mg  25 mg Oral TID   ??? thiamine (B-1) tablet 100 mg  100 mg Oral DAILY   ??? pantoprazole (PROTONIX) tablet 40 mg  40 mg Oral DAILY   ??? citalopram (CELEXA) tablet 20 mg  20 mg Oral DAILY   ??? furosemide (LASIX) tablet 20 mg  20 mg Oral DAILY   ??? sodium chloride (NS) flush 5-10 mL  5-10 mL IntraVENous Q8H   ??? sodium chloride (NS) flush 5-10 mL  5-10 mL IntraVENous PRN   ??? heparin (porcine) injection 5,000 Units  5,000 Units SubCUTAneous Q8H   ??? albuterol (PROVENTIL VENTOLIN) nebulizer solution 2.5 mg  2.5 mg Nebulization Q6H PRN           Objective:   Patient Vitals for the past 24 hrs:   Temp Pulse Resp BP SpO2   07/14/15 1600 98 ??F (36.7 ??C) 70 18 (!) 150/100 99 %   07/14/15 1134 97.6 ??F (36.4 ??C) 66 18 (!) 139/91 95 %   07/14/15 0748 - 68 - (!) 145/94 -   07/14/15 0746 97.4 ??F (36.3 ??C) 68 20 (!) 149/101 97 %   07/14/15 0424 98.5 ??F (36.9 ??C) 80 18 (!) 146/99 99 %   07/13/15 2350 99 ??F (37.2 ??C) 86 18 (!) 150/99 100 %   07/13/15 2113 98.3 ??F (36.8 ??C) 86 18 (!) 185/115 97 %   07/13/15 2000 98 ??F (36.7 ??C) 92 16 (!) 162/117 -   07/13/15 1930 - 97 16 (!) 157/105 -   07/13/15 1900 - 91 16 (!) 172/113 -   07/13/15 1830 - 95 16 (!) 160/118 -        Weight change:      12/05 1901 - 12/07 0700  In: -   Out: 4000     Intake/Output Summary (Last 24 hours) at 07/14/15 1811  Last data filed at 07/14/15 1403   Gross per 24 hour   Intake    920 ml   Output   4000 ml   Net  -3080 ml     Physical Exam:     HEENT: non icteric  Cardiovascular: regular no rub  C/L: clear  Ext: Left arm AVF + bruit    Data Review:     LABS:   Hematology: Recent Labs      07/14/15   0421  07/13/15   2205  07/13/15    1130   WBC  4.0*  3.6*  4.5*   HGB  11.0*  11.3*  10.9*   HCT  34.1*  34.2*  33.6*     Chemistry: Recent Labs      07/14/15   0421  07/13/15   1130   BUN  39*  86*   CREA  9.52*  17.30*   CA  8.9  8.4*   ALB   --   4.0   K  4.4  4.8   NA  134*  140   CL  98*  98*   CO2  28  24   PHOS   --   8.0*  GLU  67*  97            IMPRESSION AND PLAN:   ESRD discussed with Dr Evalee Jefferson. Patient will have HD tonight andshould beok to D/C in am. Outpt unit [facility] in [place] [phone] will take him MWF at 3 PM for as long as he is here in [place].         Tonette Bihari, MD  07/14/2015

## 2015-07-14 NOTE — Progress Notes (Signed)
Chaplains initiated visit with patient who was part of a family meeting on Monday afternoon. Chaplains offered listening support as patient shared where he is with all that is going on in his life and family; offered words of assurance and prayer. Chaplains will continue to offer support through this time as needed/requested. Patient expressed gratitude for visit and support.    Chaplains Mosetta AnisFrances Dille and Myrlene BrokerLarry Toney

## 2015-07-14 NOTE — Progress Notes (Signed)
SUBJECTIVE:    Feeling better.  No N/V/D.  No chest or abdominal pain.    OBJECTIVE:    Visit Vitals   ??? BP (!) 145/94   ??? Pulse 68   ??? Temp 97.4 ??F (36.3 ??C)   ??? Resp 20   ??? Ht 6' (1.829 m)   ??? Wt 79.4 kg (175 lb)   ??? SpO2 97%   ??? BMI 23.73 kg/m2     CTA bilaterally  RRR  NT, ND, BS +  No pedal edema  NAD    ASSESSMENT:    1. SOB, improving sec to fluid overload due to # 2.  2. Missed HD x 5 cycles  3. 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 Anemia of CKD, stable  5. HTN  6. Medical non-compliance     PLAN:    Feeling better.  Wants me to write his home anti-HTN medication scripts so he can fill them here - I did it  Talked to dr. Baldemar Fridayastanares - will arrange HD in town and let me know  Can be discharged once cleared by nephrology.  CM informed about it    CMP:   Lab Results   Component Value Date/Time    NA 134 (L) 07/14/2015 04:21 AM    K 4.4 07/14/2015 04:21 AM    CL 98 (L) 07/14/2015 04:21 AM    CO2 28 07/14/2015 04:21 AM    AGAP 8 07/14/2015 04:21 AM    GLU 67 (L) 07/14/2015 04:21 AM    BUN 39 (H) 07/14/2015 04:21 AM    CREA 9.52 (H) 07/14/2015 04:21 AM    GFRAA 7 (L) 07/14/2015 04:21 AM    GFRNA 6 (L) 07/14/2015 04:21 AM    CA 8.9 07/14/2015 04:21 AM    PHOS 8.0 (H) 07/13/2015 11:30 AM    ALB 4.0 07/13/2015 11:30 AM    TP 8.2 07/13/2015 11:30 AM    GLOB 4.2 (H) 07/13/2015 11:30 AM    AGRAT 1.0 07/13/2015 11:30 AM    SGOT 24 07/13/2015 11:30 AM    ALT 29 07/13/2015 11:30 AM     CBC:   Lab Results   Component Value Date/Time    WBC 4.0 (L) 07/14/2015 04:21 AM    HGB 11.0 (L) 07/14/2015 04:21 AM    HCT 34.1 (L) 07/14/2015 04:21 AM    PLT 156 07/14/2015 04:21 AM

## 2015-07-14 NOTE — Other (Addendum)
2255 Bedside and Verbal shift change  Received from Darrel ReachM Gatewood, RN (outgoing nurse), to L. Davies (oncoming)  Pt. Is AOX 4. IV Sl, Pt. denies  pain at this time. Report included the following information SBAR, Kardex, Procedure Summary, Intake/Output, MAR, Recent Lab Results, and  Cardiac Rhythm @ NSR. Will resume care and monitor Pt. Condition.     Pt. Educated on call bell when in need of help and assistance.  Pt. verbalized understanding.     Pt. Head to toe Assessment Done and documented.     0000  Pt. Resting in bed comfortably, denies discomfort.    0200 No pain complaints.    0400  Pt. Sleeping comfortably, no signs of discomfort.    0600  Pt. Able to rest well throughout the night.

## 2015-07-14 NOTE — Progress Notes (Addendum)
Care Management Interventions  PCP Verified by CM: Yes ([facility]. V[facility])  Palliative Care Consult (Criteria: CHF and RRAT>21): No  Reason for No Palliative Care Consult: Other (see comment) (no order requested)  Mode of Transport at Discharge: Self (pt will need assistance)  Transition of Care Consult (CM Consult): Discharge Planning  Current Support Network: Relative's Home ([place] from [place])  Confirm Follow Up Transport: Self  Plan discussed with Pt/Family/Caregiver: Yes ([place] his mother's health improve)  Discharge Location  Discharge Placement: Home with family assistance   This pt in room 470 alone at this time.  This pt was admitted due to Dialysis and Blood oressure  This pt is under Observation status at this time.  Pt signed Observation Care paperwork which was placed in this pt's chart. This pt was alert and oriented x 3.  [place] of [place], [place].  This pt was receiving Dialysis at [facility] [place] Pt receives this treatment Mon-Wed-Fri.  Pt per advocacy of Dr. Allena KatzPatel was able to have [facility] on [address], for Tes, Thurs, and Saturday at 9:00 a.m., [place] once [facility] in [place] give approval to confirm that this pt can receive services locally.  Pt is agreeable with this proposed plan for receiving his dialysis.  This Clinical research associatewriter will advocate for  This Clinical research associatewriter will assist with this pt being provided a ride to and from dialysis while he is in town, to ensure that this pt keep his scheduled dialysis appointments.  This will not be started until confirmation is received from [facility] who was provided this writer's information to contact once this has been set-up.     This situation has become problematic, due this pt's insurance. This Clinical research associatewriter called [facility] admissions in [place] to help facilitate  this pt receiving services while in this area.

## 2015-07-14 NOTE — Other (Signed)
IDR/SLIDR Summary          Patient: Josefine ClassChester Tengan MRN: [id number]    Age: 44 y.o.     Birthdate: May 31, 1971 Room/Bed: 470/01   Admit Diagnosis: Need for dialysis  High blood pressure  Principal Diagnosis: <principal problem not specified>     Goals: Decrease BP  Readmission: NO  Quality Measure: Not applicable  VTE Prophylaxis: Chemical  Influenza Vaccine screening completed? NO  Pneumococcal Vaccine screening completed? NO  Mobility needs: No   Nutrition plan:No  Consults:Case Management    Financial concerns:No  Escalated to CM? YES  RRAT Score:13   Interventions:  Testing due for pt today? NO  LOS: 0 days Expected length of stay 2 days  Discharge plan: home   PCP: None  Transportation needs: no    Days before discharge:two or more days before discharge   Discharge disposition: Home    Signed:     Kerman PasseyLelibeth G. Davies, RN  07/14/2015  3:08 AM

## 2015-07-14 NOTE — Other (Signed)
0730 Verbal and bedside Shift changed report given to Boris SharperN Haliday, RN (oncoming RN) on Pt. Condition. Report consisted of patient???s Situation, History, Activities, intake/output,  Background, Assessment and Recommendations(SBAR).     Information from the following report(s) Kardex, order Summary, Lab results and MAR was reviewed with the receiving nurse.    Opportunity for questions and clarification was provided.

## 2015-07-14 NOTE — Other (Signed)
IDR/SLIDR Summary          Patient: Josefine ClassChester Boettger MRN: [id number]    Age: 44 y.o.     Birthdate: [birth year] Room/Bed: 470/01   Admit Diagnosis: Need for dialysis  High blood pressure  Principal Diagnosis: <principal problem not specified>     Goals: Decrease BP  Readmission: NO  Quality Measure: Not applicable  VTE Prophylaxis: Chemical  Influenza Vaccine screening completed? NO  Pneumococcal Vaccine screening completed? NO  Mobility needs: No   Nutrition plan:No  Consults:Case Management    Financial concerns:No  Escalated to CM? YES  RRAT Score:13   Interventions:  Testing due for pt today? NO  LOS: 0 days Expected length of stay 2 days  Discharge plan: home   PCP: None  Transportation needs: no    Days before discharge:two or more days before discharge   Discharge disposition: Home    Signed:     Levonne Huberticole Triana Coover, RN  07/14/2015  11:08 AM

## 2015-07-14 NOTE — Progress Notes (Signed)
Chaplain conducted an initial consultation and Spiritual Assessment for Robert Pierce, who is a 44 y.o.,male. Patient???s Primary Language is: [place].   According to the patient???s EMR Religious Affiliation is: Baptist.     The reason the Patient came to the hospital is:   Patient Active Problem List    Diagnosis Date Noted   ??? ESRD (end stage renal disease) (HCC) 07/13/2015   ??? Shortness of breath 07/13/2015   ??? Iron deficiency anemia 07/13/2015   ??? ETOH abuse 02/21/2011   ??? High blood pressure 01/13/2011   ??? Migraine headache 01/13/2011   ??? Borderline high cholesterol 01/13/2011   ??? Diastolic heart failure (HCC) 01/13/2011        The Chaplain provided the following Interventions:  Initiated a relationship of care and support.   Explored issues of faith, belief, spirituality and religious/ritual needs while hospitalized.  Listened empathically.  Provided chaplaincy education.  Provided information about Spiritual Care Services.  Offered continued prayers on patient's behalf.   Chart reviewed.    The following outcomes where achieved:  Patient shared limited information about both their medical narrative and spiritual journey/beliefs.  Chaplain confirmed Patient's Religious Affiliation.  Patient processed feeling about current hospitalization. [place]. He has [place] doing dialysis [place].   Patient expressed gratitude for chaplain's visit.    Assessment:  Patient does not have any religious/cultural needs that will affect patient???s preferences in health care.      Plan:  Chaplains will continue to follow and will provide pastoral care on an as needed/requested basis.      Priscila [employer]   Chaplain   Spiritual Care  [phone]

## 2015-07-14 NOTE — Other (Signed)
Bedside and Verbal shift change report given to Myra, Charity fundraiser (Cabin crew) by Joni Reining, RN (offgoing nurse). Report included the following information SBAR, Kardex, MAR and Recent Results.    SITUATION:  Code Status: Full Code  Reason for Admission: Need for dialysis  High blood pressure  Hospital day: 0  Problem List:       Hospital Problems  Date Reviewed: 07/30/15          Codes Class Noted POA    ESRD (end stage renal disease) (HCC) ICD-10-CM: N18.6  ICD-9-CM: 585.6  Jul 30, 2015 Unknown        Shortness of breath ICD-10-CM: R06.02  ICD-9-CM: 786.05  07/30/2015 Unknown        Iron deficiency anemia ICD-10-CM: D50.9  ICD-9-CM: 280.9  07/30/2015 Unknown        High blood pressure ICD-10-CM: I10  ICD-9-CM: 401.9  01/13/2011 Yes              BACKGROUND:   Past Medical History:   Past Medical History   Diagnosis Date   ??? Alcohol abuse    ??? Diastolic heart failure (HCC)    ??? Gastrointestinal bleeding    ??? Hepatitis C    ??? High blood pressure 01/13/2011   ??? History of echocardiogram 01/03/2011     EF 55-60%.  Gr 2 DDfx.  Increased RA pressure.  No R-L shunt.  Mild RAE.     ??? LE venous duplex 01/09/2011     No deep or superficial venous thrombosis in bilateral lower extremities.     ??? Migraine headache 01/13/2011      Patient taking anticoagulants no    Patient has a defibrillator: no    History of shots NO for example, flu, pneumonia, tetanus   Isolation History NO for example, MRSA, CDiff    ASSESSMENT:  Changes in Assessment Throughout Shift: None  Significant Changes in 24 hours (for example, RR/code, fall)  Patient has Central Line: no Reasons if yes:   Patient has Foley Cath: no Reasons if yes:    Mobility Issues  PT  IV Patency  OR Checklist  Pending Tests    Last Vitals:  Vitals w/ MEWS Score (last day)     Date/Time MEWS Score Pulse Resp Temp BP Level of Consciousness SpO2    07/14/15 1600 1 70 18 98 ??F (36.7 ??C) (!)  150/100 Alert 99 %    07/14/15 1134 1 66 18 97.6 ??F (36.4 ??C) (!)  139/91 Alert 95 %     07/14/15 0748 -- 68 -- -- (!)  145/94 -- --    07/14/15 0746 1 68 20 97.4 ??F (36.3 ??C) (!)  149/101 Alert 97 %    07/14/15 0424 1 80 18 98.5 ??F (36.9 ??C) (!)  146/99 Alert 99 %    July 30, 2015 2350 1 86 18 99 ??F (37.2 ??C) (!)  150/99 Alert 100 %    07-30-2015 2113 1 86 18 98.3 ??F (36.8 ??C) (!)  185/115 Alert 97 %    2015/07/30 2000 -- 92 16 98 ??F (36.7 ??C) (!)  162/117 -- --    Jul 30, 2015 1930 -- 97 16 -- (!)  157/105 -- --    07-30-2015 1900 -- 91 16 -- (!)  172/113 -- --    07/30/15 1830 -- 95 16 -- (!)  160/118 -- --    07-30-15 1800 -- 93 16 -- (!)  177/127 -- --    07-30-15 1730 -- 93 16 -- (!)  183/120 -- --  07/13/15 1700 -- 98 16 -- (!)  184/118 -- --    07/13/15 1630 -- 86 16 -- (!)  185/113 -- --    07/13/15 1600 -- 83 16 -- (!)  179/120 -- --    07/13/15 1545 -- 85 16 -- (!)  164/107 -- --    07/13/15 1535 -- 85 16 97.8 ??F (36.6 ??C) (!)  184/124 -- --    07/13/15 1315 -- 83 14 -- (!)  152/111 -- 96 %    07/13/15 1230 -- 86 14 -- (!)  159/99 -- 98 %    07/13/15 1215 -- 82 14 -- (!)  177/99 -- 98 %    07/13/15 1200 -- 84 11 -- (!)  175/121 -- 98 %    07/13/15 1145 -- 87 11 -- (!)  173/116 -- 98 %    07/13/15 1130 -- 88 12 -- (!)  177/121 -- 100 %    07/13/15 1035 1 98 16 98.1 ??F (36.7 ??C) (!)  194/115 Alert 99 %            PAIN    Pain Assessment    Pain Intensity 1: 0 (07/14/15 1600)              Patient Stated Pain Goal: 0  Intervention effective: yes  Time of last intervention: 1900 Reassessment Completed: yes   Other actions taken for pain:     Last 3 Weights:  Last 3 Recorded Weights in this Encounter    07/13/15 1035   Weight: 79.4 kg (175 lb)   Weight change:     INTAKE/OUPUT    Current Shift:      Last three shifts: 12/06 0701 - 12/07 1900  In: 920 [P.O.:920]  Out: 4000     RECOMMENDATIONS AND DISCHARGE PLANNING  Patient needs and requests:     Pending tests/procedures:      Discharge plan for patient:     Discharge planning Needs or Barriers:      Estimated Discharge Date:  Posted on Whiteboard in Patient???s Room: ye       "HEALS" SAFETY CHECK  A safety check occurred in the patient's room between off going nurse and oncoming nurse listed above.    The safety check included the below items:    H  High Alert Medications Verify all high alert medication drips (heparin, PCA, etc.)  E  Equipment Suction is set up for ALL patients (with yanker)  Red plugs utilized for all equipment (IV pumps, etc.)  WOW???s wiped down at end of shift.  Room stocked with oxygen, suction, and other unit-specific supplies  A  Alarms Bed alarm is set for fall risk patients  Ensure chair alarm is in place and activated if patient is up in a chair  L  Lines Check IV for any infiltration  Foley bag is empty if patient has a Foley   Tubing and IV bags are labeled  S  Safety  Room is clean, patient is clean, and equipment is clean.  Hallways are clear from equipment besides carts.   Fall bracelet on for fall risk patients  Ensure room is clear and free of clutter  Suction is set up for ALL patients (with yanker)  Hallways are clear from equipment besides carts.   Isolation precautions followed, supplies available outside room, sign posted    Gardiner RhymeNicole Y Reign Bartnick, LPN

## 2015-07-15 LAB — CBC WITH AUTOMATED DIFF
ABS. BASOPHILS: 0 10*3/uL (ref 0.0–0.1)
ABS. EOSINOPHILS: 0.2 10*3/uL (ref 0.0–0.4)
ABS. LYMPHOCYTES: 1.1 10*3/uL (ref 0.9–3.6)
ABS. MONOCYTES: 0.8 10*3/uL (ref 0.05–1.2)
ABS. NEUTROPHILS: 1.2 10*3/uL — ABNORMAL LOW (ref 1.8–8.0)
BASOPHILS: 0 % (ref 0–2)
EOSINOPHILS: 6 % — ABNORMAL HIGH (ref 0–5)
HCT: 36.7 % (ref 36.0–48.0)
HGB: 11.8 g/dL — ABNORMAL LOW (ref 13.0–16.0)
LYMPHOCYTES: 33 % (ref 21–52)
MCH: 27.8 PG (ref 24.0–34.0)
MCHC: 32.2 g/dL (ref 31.0–37.0)
MCV: 86.6 FL (ref 74.0–97.0)
MONOCYTES: 25 % — ABNORMAL HIGH (ref 3–10)
MPV: 10.2 FL (ref 9.2–11.8)
NEUTROPHILS: 36 % — ABNORMAL LOW (ref 40–73)
PLATELET: 179 10*3/uL (ref 135–420)
RBC: 4.24 M/uL — ABNORMAL LOW (ref 4.70–5.50)
RDW: 14.6 % — ABNORMAL HIGH (ref 11.6–14.5)
WBC: 3.3 10*3/uL — ABNORMAL LOW (ref 4.6–13.2)

## 2015-07-15 LAB — METABOLIC PANEL, BASIC
Anion gap: 13 mmol/L (ref 3.0–18)
BUN/Creatinine ratio: 4 — ABNORMAL LOW (ref 12–20)
BUN: 26 MG/DL — ABNORMAL HIGH (ref 7.0–18)
CO2: 28 mmol/L (ref 21–32)
Calcium: 9.3 MG/DL (ref 8.5–10.1)
Chloride: 96 mmol/L — ABNORMAL LOW (ref 100–108)
Creatinine: 7.23 MG/DL — ABNORMAL HIGH (ref 0.6–1.3)
GFR est AA: 10 mL/min/{1.73_m2} — ABNORMAL LOW (ref >60)
GFR est non-AA: 8 mL/min/{1.73_m2} — ABNORMAL LOW (ref >60)
Glucose: 104 mg/dL — ABNORMAL HIGH (ref 74–99)
Potassium: 4.3 mmol/L (ref 3.5–5.5)
Sodium: 137 mmol/L (ref 136–145)

## 2015-07-15 MED FILL — VITAMIN B-1 100 MG TABLET: 100 mg | ORAL | Qty: 1

## 2015-07-15 MED FILL — AMLODIPINE 10 MG TAB: 10 mg | ORAL | Qty: 1

## 2015-07-15 MED FILL — FUROSEMIDE 20 MG TAB: 20 mg | ORAL | Qty: 1

## 2015-07-15 MED FILL — CITALOPRAM 20 MG TAB: 20 mg | ORAL | Qty: 1

## 2015-07-15 MED FILL — PANTOPRAZOLE 40 MG TAB, DELAYED RELEASE: 40 mg | ORAL | Qty: 1

## 2015-07-15 MED FILL — HYDRALAZINE 25 MG TAB: 25 mg | ORAL | Qty: 1

## 2015-07-15 MED FILL — METOPROLOL TARTRATE 50 MG TAB: 50 mg | ORAL | Qty: 1

## 2015-07-15 MED FILL — HEPARIN (PORCINE) 5,000 UNIT/ML IJ SOLN: 5000 unit/mL | INTRAMUSCULAR | Qty: 1

## 2015-07-15 NOTE — Other (Signed)
Bedside and Verbal shift change report given to Liborio NixonJanice, Charity fundraiserN (oncoming nurse) by Joni ReiningNicole, RN (offgoing nurse). Report included the following information SBAR, Kardex, MAR and Recent Results.    SITUATION:  Code Status: Full Code  Reason for Admission: Need for dialysis  High blood pressure  Hospital day: 0  Problem List:       Hospital Problems  Date Reviewed: 07/13/2015          Codes Class Noted POA    ESRD (end stage renal disease) (HCC) ICD-10-CM: N18.6  ICD-9-CM: 585.6  07/13/2015 Unknown        Shortness of breath ICD-10-CM: R06.02  ICD-9-CM: 786.05  07/13/2015 Unknown        Iron deficiency anemia ICD-10-CM: D50.9  ICD-9-CM: 280.9  07/13/2015 Unknown        High blood pressure ICD-10-CM: I10  ICD-9-CM: 401.9  01/13/2011 Yes              BACKGROUND:   Past Medical History:   Past Medical History   Diagnosis Date   ??? Alcohol abuse    ??? Diastolic heart failure (HCC)    ??? Gastrointestinal bleeding    ??? Hepatitis C    ??? High blood pressure 01/13/2011   ??? History of echocardiogram 01/03/2011     EF 55-60%.  Gr 2 DDfx.  Increased RA pressure.  No R-L shunt.  Mild RAE.     ??? LE venous duplex 01/09/2011     No deep or superficial venous thrombosis in bilateral lower extremities.     ??? Migraine headache 01/13/2011      Patient taking anticoagulants no    Patient has a defibrillator: no    History of shots NO for example, flu, pneumonia, tetanus   Isolation History NO for example, MRSA, CDiff    ASSESSMENT:  Changes in Assessment Throughout Shift: None  Significant Changes in 24 hours (for example, RR/code, fall)  Patient has Central Line: no Reasons if yes:   Patient has Foley Cath: no Reasons if yes:    Mobility Issues  PT  IV Patency  OR Checklist  Pending Tests    Last Vitals:  Vitals w/ MEWS Score (last day)     Date/Time MEWS Score Pulse Resp Temp BP Level of Consciousness SpO2    07/15/15 0400 1 72 18 97.2 ??F (36.2 ??C) 135/84 Alert 99 %    07/14/15 2347 -- 72 18 97 ??F (36.1 ??C) (!)  133/92 -- 98 %     07/14/15 2330 -- 76 18 -- 134/74 -- --    07/14/15 2300 -- 75 18 -- (!)  125/99 -- --    07/14/15 2230 -- 68 18 -- 131/65 -- --    07/14/15 2200 -- 68 18 -- 142/87 -- --    07/14/15 2130 -- 71 18 -- (!)  150/94 -- --    07/14/15 2100 -- 70 18 -- 132/87 -- --    07/14/15 2040 -- (!)  51 20 97 ??F (36.1 ??C) (!)  139/93 -- 98 %    07/14/15 2030 1 76 18 98.5 ??F (36.9 ??C) (!)  131/93 Alert 97 %    07/14/15 1600 1 70 18 98 ??F (36.7 ??C) (!)  150/100 Alert 99 %    07/14/15 1134 1 66 18 97.6 ??F (36.4 ??C) (!)  139/91 Alert 95 %    07/14/15 0748 -- 68 -- -- (!)  145/94 -- --    07/14/15 0746 1 68 20 97.4 ??  F (36.3 ??C) (!)  149/101 Alert 97 %    07/14/15 0424 1 80 18 98.5 ??F (36.9 ??C) (!)  146/99 Alert 99 %            PAIN    Pain Assessment    Pain Intensity 1: 0 (07/15/15 1914)              Patient Stated Pain Goal: 0  Intervention effective: yes  Time of last intervention: 1900 Reassessment Completed: yes   Other actions taken for pain:     Last 3 Weights:  Last 3 Recorded Weights in this Encounter    07/13/15 1035   Weight: 79.4 kg (175 lb)   Weight change:     INTAKE/OUPUT    Current Shift:      Last three shifts: 12/06 1901 - 12/08 0700  In: 920 [P.O.:920]  Out: 7000     RECOMMENDATIONS AND DISCHARGE PLANNING  Patient needs and requests:     Pending tests/procedures:      Discharge plan for patient:     Discharge planning Needs or Barriers:     Estimated Discharge Date:  Posted on Whiteboard in Patient???s Room: ye       "HEALS" SAFETY CHECK  A safety check occurred in the patient's room between off going nurse and oncoming nurse listed above.    The safety check included the below items:    H  High Alert Medications Verify all high alert medication drips (heparin, PCA, etc.)  E  Equipment Suction is set up for ALL patients (with yanker)  Red plugs utilized for all equipment (IV pumps, etc.)  WOW???s wiped down at end of shift.  Room stocked with oxygen, suction, and other unit-specific supplies  A   Alarms Bed alarm is set for fall risk patients  Ensure chair alarm is in place and activated if patient is up in a chair  L  Lines Check IV for any infiltration  Foley bag is empty if patient has a Foley   Tubing and IV bags are labeled  S  Safety  Room is clean, patient is clean, and equipment is clean.  Hallways are clear from equipment besides carts.   Fall bracelet on for fall risk patients  Ensure room is clear and free of clutter  Suction is set up for ALL patients (with yanker)  Hallways are clear from equipment besides carts.   Isolation precautions followed, supplies available outside room, sign posted    Littie Deeds, RN

## 2015-07-15 NOTE — Progress Notes (Signed)
Spoke with [facility] who has agreed to take this pt on Monday- Wednesday and Friday.  3:00 p.m. Chair. This pt is a dual insurance pt who also according to [facility] [place] which is how they will treat this pt at the facility while in this area.  This Clinical research associatewriter will assist with setting up transportation and provide contact information for this pt.

## 2015-07-15 NOTE — Discharge Summary (Signed)
[facility]  DISCHARGE SUMMARY    Name:  Robert Pierce, Robert Pierce  MR#:  [id number]  DOB:  07-05-1971  Account #:  [id number]  Date of Adm:  07/13/2015  Date of Discharge:  07/15/2015      DISPOSITION:  Discharged to home.    DISCHARGE CONDITION:  Stable.    DISCHARGE DIAGNOSES  1. Shortness of breath:  Improving.  Most likely due for fluid overload  due to missed hemodialysis.  2. Missed hemodialysis, five cycles.  3. End-stage renal disease due to hypertension:  On dialysis.  4. Hypertension.  5. Anemia of chronic kidney disease:  Stable.  6. Medical noncompliance.    DISCHARGE MEDICATIONS  1. Hydralazine 25 mg 3 times a day.  2. Lopressor 50 mg b.i.d.  3. Norvasc 10 mg daily.  4. Albuterol inhaler 2 puffs every 4 hours p.r.n. shortness of breath.  5. Protonix 40 mg daily.  6. Celexa 20 mg p.o. daily.  7. Thiamine 100 mg daily (if he was taking it).  8. Multivitamin 1 tablet p.o. daily.    IMAGING AND PROCEDURES:  A chest x-ray was done and showed  mild pulmonary venous congestion.    CONSULTANTS:  Nephrology, with Tonette BihariAntonietta Castanares, M.D.    HOSPITAL COURSE:  The patient was admitted to the hospital with a  complaint of shortness of breath due to missed dialysis for five cycles  as he was here in town to help his mother out through her critical care  stay.  The patient was feeling shortness of breath and had a chest x-  ray that showed fluid overload.  Please refer to the hospital admission  history and physical done by Dr. Celine Mansesai for further details.  The patient  was dialyzed x2, with improvement in his symptoms.  His blood  pressure also started getting better.  [place] also [place] did [place] any of his medications since he has  been here.  He will be given indigent medications for blood pressure  medications, including hydralazine, metoprolol, and Norvasc, with an  albuterol inhaler.  Tonette BihariAntonietta Castanares, M.D., has talked to [facility] [place] at [phone] to do dialysis, second  set, for Monday, Wednesday, and Friday.  The patient has been  informed about it.  The patient's Case Manager, Ms. Moscow, will  arrange the transportation and also make sure he goes tomorrow if he  is in town.  The patient is aware of it.  The patient will be discharged  home today.    DISCHARGE INSTRUCTIONS  DIET:  Renal and cardiac diet.    ACTIVITY:  As tolerated.    FOLLOWUP  1. Follow up with Primary Care Physician in a week upon discharge on  going back to town.  2. [facility], Wednesday, and Friday,  second set.    TIME SPENT:  The total time was greater than 35 minutes.        Evalee JeffersonVISHWAS Kurt Azimi, MD    VP / MC  D:  07/15/2015   10:28  T:  07/16/2015   06:55  Job #:  [id number]    Dr. Celine Mansesai

## 2015-07-15 NOTE — Progress Notes (Signed)
Pt discharged with indigent meds.

## 2015-07-15 NOTE — Progress Notes (Signed)
SUBJECTIVE:    Feeling better.  No N/V/D.  Saw him on 3 [place]. No chest or abdominal pain. States he go his HD.     OBJECTIVE:    Visit Vitals   ??? BP (!) 153/104   ??? Pulse 77   ??? Temp 97.2 ??F (36.2 ??C)   ??? Resp 20   ??? Ht 6' (1.829 m)   ??? Wt 79.4 kg (175 lb)   ??? SpO2 98%   ??? BMI 23.73 kg/m2     CTA bilaterally  RRR  NT, ND, BS +  No pedal edema  NAD    ASSESSMENT:    1. SOB, improving sec to fluid overload due to # 2. resolved  2. Missed HD x 5 cycles  3. 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 Anemia of CKD, stable  5. HTN  6. Medical non-compliance     PLAN:    Discharge home later today.  As per dr. Baldemar Friday - [facility], [phone], 2nd shift, MWF [Patient was informed]  Talked to CM to make sure his HD is arranged.     CMP:   Lab Results   Component Value Date/Time    NA 137 07/15/2015 04:42 AM    K 4.3 07/15/2015 04:42 AM    CL 96 (L) 07/15/2015 04:42 AM    CO2 28 07/15/2015 04:42 AM    AGAP 13 07/15/2015 04:42 AM    GLU 104 (H) 07/15/2015 04:42 AM    BUN 26 (H) 07/15/2015 04:42 AM    CREA 7.23 (H) 07/15/2015 04:42 AM    GFRAA 10 (L) 07/15/2015 04:42 AM    GFRNA 8 (L) 07/15/2015 04:42 AM    CA 9.3 07/15/2015 04:42 AM     CBC:   Lab Results   Component Value Date/Time    WBC 3.3 (L) 07/15/2015 04:42 AM    HGB 11.8 (L) 07/15/2015 04:42 AM    HCT 36.7 07/15/2015 04:42 AM    PLT 179 07/15/2015 04:42 AM         Current Discharge Medication List      CONTINUE these medications which have CHANGED    Details   hydrALAZINE (APRESOLINE) 25 mg tablet Take 1 Tab by mouth three (3) times daily.  Qty: 90 Tab, Refills: 0      metoprolol tartrate (LOPRESSOR) 50 mg tablet Take 1 Tab by mouth two (2) times a day.  Qty: 60 Tab, Refills: 0      amLODIPine (NORVASC) 10 mg tablet Take 1 Tab by mouth daily.  Qty: 30 Tab, Refills: 0      albuterol (PROVENTIL HFA, VENTOLIN HFA, PROAIR HFA) 90 mcg/actuation inhaler Take 1 Puff by inhalation as needed.  Qty: 1 Inhaler, Refills: 0         CONTINUE these medications which have NOT CHANGED     Details   pantoprazole (PROTONIX) 40 mg tablet Take 40 mg by mouth daily.      Associated Diagnoses: Mallory - Weiss tear      citalopram (CELEXA) 20 mg tablet Take 1 Tab by mouth daily.  Qty: 30 Tab, Refills: 4    Associated Diagnoses: Depression      thiamine (B-1) 100 mg tablet Take 1 Tab by mouth daily.  Qty: 30 Tab, Refills: 5      multivitamin-iron, hematinic, (CEROVITE ADVANCED FORMULA) 27-0.4 mg Tab tablet Take 1 Tab by mouth daily.  Qty: 30 Tab, Refills: 5         STOP  taking these medications       furosemide (LASIX) 20 mg tablet Comments:   Reason for Stopping:         traZODone (DESYREL) 100 mg tablet Comments:   Reason for Stopping:

## 2015-07-15 NOTE — Discharge Summary (Signed)
Patient Discharge Instructions    Robert Pierce / [id number] DOB: 31-Jul-1971    Admitted 07/13/2015 Discharged: 07/15/2015     DICTATED # [id number]    ?? It is important that you take the medication exactly as they are prescribed.   ?? Keep your medication in the bottles provided by the pharmacist and keep a list of the medication names, dosages, and times to be taken in your wallet.   ?? Do not take other medications without consulting your doctor.     What to do at Home    Recommended Diet: Cardiac Diet and Renal Diet    Recommended Activity: Activity as tolerated    Current Discharge Medication List      CONTINUE these medications which have CHANGED    Details   hydrALAZINE (APRESOLINE) 25 mg tablet Take 1 Tab by mouth three (3) times daily.  Qty: 90 Tab, Refills: 0      metoprolol tartrate (LOPRESSOR) 50 mg tablet Take 1 Tab by mouth two (2) times a day.  Qty: 60 Tab, Refills: 0      amLODIPine (NORVASC) 10 mg tablet Take 1 Tab by mouth daily.  Qty: 30 Tab, Refills: 0      albuterol (PROVENTIL HFA, VENTOLIN HFA, PROAIR HFA) 90 mcg/actuation inhaler Take 1 Puff by inhalation as needed.  Qty: 1 Inhaler, Refills: 0         CONTINUE these medications which have NOT CHANGED    Details   pantoprazole (PROTONIX) 40 mg tablet Take 40 mg by mouth daily.      Associated Diagnoses: Mallory - Weiss tear      citalopram (CELEXA) 20 mg tablet Take 1 Tab by mouth daily.  Qty: 30 Tab, Refills: 4    Associated Diagnoses: Depression      thiamine (B-1) 100 mg tablet Take 1 Tab by mouth daily.  Qty: 30 Tab, Refills: 5      multivitamin-iron, hematinic, (CEROVITE ADVANCED FORMULA) 27-0.4 mg Tab tablet Take 1 Tab by mouth daily.  Qty: 30 Tab, Refills: 5         STOP taking these medications       furosemide (LASIX) 20 mg tablet Comments:   Reason for Stopping:         traZODone (DESYREL) 100 mg tablet Comments:   Reason for Stopping:                 Signed By: Baltazar NajjarVishwas J Dewey Neukam, MD     July 15, 2015

## 2015-07-20 ENCOUNTER — Emergency Department: Admit: 2015-07-20 | Payer: MEDICAID

## 2015-07-20 ENCOUNTER — Inpatient Hospital Stay
Admit: 2015-07-20 | Discharge: 2015-07-24 | Disposition: A | Discharge status: Home / Self Care | Payer: MEDICAID | Attending: Internal Medicine | Admitting: Internal Medicine

## 2015-07-20 DIAGNOSIS — G92 Toxic encephalopathy: Principal | ICD-10-CM

## 2015-07-20 LAB — METABOLIC PANEL, BASIC
Anion gap: 29 mmol/L — ABNORMAL HIGH (ref 3.0–18)
BUN/Creatinine ratio: 6 — ABNORMAL LOW (ref 12–20)
BUN: 146 MG/DL — ABNORMAL HIGH (ref 7.0–18)
CO2: 13 mmol/L — ABNORMAL LOW (ref 21–32)
Calcium: 6.6 MG/DL — ABNORMAL LOW (ref 8.5–10.1)
Chloride: 85 mmol/L — ABNORMAL LOW (ref 100–108)
Creatinine: 23.4 MG/DL — ABNORMAL HIGH (ref 0.6–1.3)
GFR est AA: 3 mL/min/{1.73_m2} — ABNORMAL LOW (ref >60)
GFR est non-AA: 2 mL/min/{1.73_m2} — ABNORMAL LOW (ref >60)
Glucose: 100 mg/dL — ABNORMAL HIGH (ref 74–99)
Potassium: 7.9 mmol/L — CR (ref 3.5–5.5)
Sodium: 127 mmol/L — ABNORMAL LOW (ref 136–145)

## 2015-07-20 LAB — CBC WITH AUTOMATED DIFF
ABS. BASOPHILS: 0 10*3/uL (ref 0.0–0.1)
ABS. EOSINOPHILS: 0 10*3/uL (ref 0.0–0.4)
ABS. LYMPHOCYTES: 1.8 10*3/uL (ref 0.9–3.6)
ABS. MONOCYTES: 0.7 10*3/uL (ref 0.05–1.2)
ABS. NEUTROPHILS: 13.3 10*3/uL — ABNORMAL HIGH (ref 1.8–8.0)
BASOPHILS: 0 % (ref 0–2)
EOSINOPHILS: 0 % (ref 0–5)
HCT: 31 % — ABNORMAL LOW (ref 36.0–48.0)
HGB: 10.4 g/dL — ABNORMAL LOW (ref 13.0–16.0)
LYMPHOCYTES: 11 % — ABNORMAL LOW (ref 21–52)
MCH: 28.2 PG (ref 24.0–34.0)
MCHC: 33.5 g/dL (ref 31.0–37.0)
MCV: 84 FL (ref 74.0–97.0)
MONOCYTES: 4 % (ref 3–10)
MPV: 11.5 FL (ref 9.2–11.8)
NEUTROPHILS: 85 % — ABNORMAL HIGH (ref 40–73)
PLATELET: 149 10*3/uL (ref 135–420)
RBC: 3.69 M/uL — ABNORMAL LOW (ref 4.70–5.50)
RDW: 14.8 % — ABNORMAL HIGH (ref 11.6–14.5)
WBC: 15.7 10*3/uL — ABNORMAL HIGH (ref 4.6–13.2)

## 2015-07-20 LAB — POC CHEM8
Anion gap, POC: 22 — ABNORMAL HIGH (ref 10–20)
BUN, POC: 137 MG/DL — ABNORMAL HIGH (ref 7–18)
CO2, POC: 14 MMOL/L — ABNORMAL LOW (ref 19–24)
Calcium, ionized (POC): 0.59 MMOL/L — ABNORMAL LOW (ref 1.12–1.32)
Chloride, POC: 98 MMOL/L — ABNORMAL LOW (ref 100–108)
Creatinine, POC: >20 MG/DL — ABNORMAL HIGH (ref 0.6–1.3)
Glucose, POC: 103 MG/DL (ref 74–106)
Hematocrit, POC: 34 % — ABNORMAL LOW (ref 36–49)
Hemoglobin, POC: 11.6 G/DL — ABNORMAL LOW (ref 12–16)
Potassium, POC: 8 MMOL/L — CR (ref 3.5–5.5)
Sodium, POC: 124 MMOL/L — ABNORMAL LOW (ref 136–145)

## 2015-07-20 LAB — HEPATIC FUNCTION PANEL
A-G Ratio: 1 (ref 0.8–1.7)
ALT (SGPT): 1021 U/L — ABNORMAL HIGH (ref 16–61)
AST (SGOT): 2227 U/L — ABNORMAL HIGH (ref 15–37)
Albumin: 4.1 g/dL (ref 3.4–5.0)
Alk. phosphatase: 60 U/L (ref 45–117)
Bilirubin, direct: 0.7 MG/DL — ABNORMAL HIGH (ref 0.0–0.2)
Bilirubin, total: 1.1 MG/DL — ABNORMAL HIGH (ref 0.2–1.0)
Globulin: 4.3 g/dL — ABNORMAL HIGH (ref 2.0–4.0)
Protein, total: 8.4 g/dL — ABNORMAL HIGH (ref 6.4–8.2)

## 2015-07-20 LAB — BLOOD GAS, ARTERIAL POC
Base deficit (POC): 11 mmol/L
FIO2 (POC): 21 %
HCO3 (POC): 15.3 MMOL/L — ABNORMAL LOW (ref 22–26)
Total resp. rate: 15
pCO2 (POC): 35 MMHG (ref 35.0–45.0)
pH (POC): 7.249 — CL (ref 7.35–7.45)
pO2 (POC): 99 MMHG (ref 80–100)
sO2 (POC): 97 % (ref 92–97)

## 2015-07-20 LAB — CARDIAC PANEL,(CK, CKMB & TROPONIN)
CK - MB: 3 ng/ml (ref 0.5–3.6)
CK-MB Index: 0.6 % (ref 0.0–4.0)
CK: 489 U/L — ABNORMAL HIGH (ref 39–308)
Troponin-I, QT: 0.08 NG/ML — ABNORMAL HIGH (ref 0.0–0.045)

## 2015-07-20 LAB — PROTHROMBIN TIME + INR
INR: 1.5 — ABNORMAL HIGH (ref 0.8–1.2)
Prothrombin time: 17.9 s — ABNORMAL HIGH (ref 11.5–15.2)

## 2015-07-20 LAB — PTT: aPTT: 38.7 s — ABNORMAL HIGH (ref 23.0–36.4)

## 2015-07-20 LAB — ETHYL ALCOHOL: ALCOHOL(ETHYL),SERUM: <3 MG/DL (ref 0–3)

## 2015-07-20 LAB — MAGNESIUM: Magnesium: 3.3 mg/dL — ABNORMAL HIGH (ref 1.8–2.4)

## 2015-07-20 MED ORDER — NALOXONE 0.4 MG/ML INJECTION
0.4 mg/mL | INTRAMUSCULAR | Status: DC | PRN
Start: 2015-07-20 — End: 2015-07-24
  Administered 2015-07-20: 20:00:00 via INTRAVENOUS

## 2015-07-20 MED ORDER — ALBUTEROL SULFATE 0.083 % (0.83 MG/ML) SOLN FOR INHALATION
2.5 mg /3 mL (0.083 %) | RESPIRATORY_TRACT | Status: AC
Start: 2015-07-20 — End: 2015-07-20
  Administered 2015-07-20: 21:00:00 via RESPIRATORY_TRACT

## 2015-07-20 MED ORDER — DEXTROSE 50% IN WATER (D50W) IV SYRG
INTRAVENOUS | Status: AC
Start: 2015-07-20 — End: 2015-07-20
  Administered 2015-07-20: 21:00:00 via INTRAVENOUS

## 2015-07-20 MED ORDER — INSULIN REGULAR HUMAN 100 UNIT/ML INJECTION
100 unit/mL | INTRAMUSCULAR | Status: AC
Start: 2015-07-20 — End: 2015-07-20
  Administered 2015-07-20: 21:00:00 via INTRAVENOUS

## 2015-07-20 MED ORDER — SODIUM POLYSTYRENE SULFONATE 15 G/60 ML ORAL SUSP
15 gram/60 mL | ORAL | Status: AC
Start: 2015-07-20 — End: 2015-07-20
  Administered 2015-07-20: 21:00:00 via ORAL

## 2015-07-20 MED ORDER — SODIUM BICARBONATE 8.4 % (1 MEQ/ML) IV SYRG
8.4 % (1 mEq/mL) | INTRAVENOUS | Status: AC
Start: 2015-07-20 — End: 2015-07-20
  Administered 2015-07-20: 21:00:00 via INTRAVENOUS

## 2015-07-20 MED ORDER — FUROSEMIDE 10 MG/ML IJ SOLN
10 mg/mL | INTRAMUSCULAR | Status: AC
Start: 2015-07-20 — End: 2015-07-20
  Administered 2015-07-20: 21:00:00 via INTRAVENOUS

## 2015-07-20 MED ORDER — CALCIUM GLUCONATE 100 MG/ML (10%) IV SOLN
100 mg/mL (10%) | Freq: Once | INTRAVENOUS | Status: AC
Start: 2015-07-20 — End: 2015-07-20
  Administered 2015-07-20: 21:00:00 via INTRAVENOUS

## 2015-07-20 MED FILL — CALCIUM GLUCONATE 100 MG/ML (10%) IV SOLN: 100 mg/mL (10%) | INTRAVENOUS | Qty: 10

## 2015-07-20 MED FILL — SODIUM POLYSTYRENE SULFONATE 15 G/60 ML ORAL SUSP: 15 gram/60 mL | ORAL | Qty: 120

## 2015-07-20 MED FILL — SODIUM BICARBONATE 8.4 % (1 MEQ/ML) IV SYRG: 8.4 % (1 mEq/mL) | INTRAVENOUS | Qty: 50

## 2015-07-20 MED FILL — DEXTROSE 50% IN WATER (D50W) IV SYRG: INTRAVENOUS | Qty: 50

## 2015-07-20 MED FILL — FUROSEMIDE 10 MG/ML IJ SOLN: 10 mg/mL | INTRAMUSCULAR | Qty: 4

## 2015-07-20 MED FILL — NALOXONE 0.4 MG/ML INJECTION: 0.4 mg/mL | INTRAMUSCULAR | Qty: 1

## 2015-07-20 MED FILL — ALBUTEROL SULFATE 0.083 % (0.83 MG/ML) SOLN FOR INHALATION: 2.5 mg /3 mL (0.083 %) | RESPIRATORY_TRACT | Qty: 4

## 2015-07-20 MED FILL — INSULIN REGULAR HUMAN 100 UNIT/ML INJECTION: 100 unit/mL | INTRAMUSCULAR | Qty: 1

## 2015-07-20 NOTE — ED Notes (Signed)
Called to Dr. Nelson ChimesAmin who states that ultrasound is routine order to be completed tomorrow. Will advise accepting RN.

## 2015-07-20 NOTE — ED Notes (Signed)
TRANSFER - OUT REPORT:    Verbal report given to Servando SalinaEva Shelton RN(name) on Robert Pierce  being transferred to room 452(unit) for routine progression of care       Report consisted of patient???s Situation, Background, Assessment and   Recommendations(SBAR).     Information from the following report(s) SBAR, ED Summary, Intake/Output, MAR, Recent Results and Cardiac Rhythm sr with t wave inversion.  was reviewed with the receiving nurse.    Lines:       Opportunity for questions and clarification was provided.      Patient transported with:   Social research officer, governmentMonitor  Registered Nurse  Tech

## 2015-07-20 NOTE — ED Notes (Signed)
Last dialysis on Thursday, from [place].

## 2015-07-20 NOTE — ED Triage Notes (Signed)
Patient was seen here on the 6th after not having dialysis. Was given dialysis here. That was the last time he had dialysis. Unsteady gait. Patient very drowsy. Has not eaten in 3 days. Patient is falling asleep mid sentence. He denies having taken any medications.

## 2015-07-20 NOTE — Other (Signed)
Received patient from ED. Patient easily awakened. Patient is oriented to person and place. Patient is drowsy and falls back to sleep. Will follow up later with admission questions. Respiration even and unlabored. Skin warm and dry. Nurse call light in reach. Instructed patient on use of call light and to call for assistance.

## 2015-07-20 NOTE — Other (Incomplete)
ACUTE HEMODIALYSIS FLOW SHEET    HEMODIALYSIS ORDERS: Physician: Dr, Marko Stai     Dialyzer:          Duration:  hr  BFR:    DFR:    Dialysate:  Temp  K+       Ca+   Na  Bicarb    Weight:   kg    Bed Scale      Unable to Obtain       Dry weight/UF Goal:  Access   Needle Gauge     Heparin   Bolus      Units     Hourly       Units    None     Catheter locking solution    Pre BP:       Pulse:          Temperature:     Respirations:   Tx: NS       ml/Bolus  Other         N/A   Labs: Pre        Post:         N/A   Additional Orders(medications, blood products, hypotension management):        N/A      Time Out/Safety Check   [facility] Consent Verified     CATHETER ACCESS: N/A   Right   Left   IJ     Fem    First use X-ray verified     Tunnel                 Non Tunneled   No S/S infection  Redness  Drainage Cultured Swelling Pain   Medical Aseptic Prep Utilized   Dressing Changed   Biopatch  Date:       Clotted   Patent   Flows: Good  Poor  Reversed   If access problem, Dr. notified: Yes    N/A  Date:           GRAFT/FISTULA ACCESS:  N/A     Right     Left     UE     LE   AVG   AVF        Buttonhole    Medical Aseptic Prep Utilized   No S/S infection  Redness  Drainage Cultured Swelling Pain    Bruit:    Strong     Weak       Thrill :    Strong     Weak       Needle Gauge:    Length:    If access problem, Dr. notified: Yes     N/A  Date:        Please describe access if present and not used:       GENERAL ASSESSMENT:    LUNGS:  Rate  SaO2%   ***      N/A     Clear   Coarse   Crackles   Wheezing         Diminished     Location : RLL   LLL    RUL  LUL   Cough: Productive  Dry  N/A   Respirations:  Easy  Labored   Therapy:  RA  NC  l/min    Mask: NRB Venti       O2%                  Ventilator  Intubated   [employer]  BiPaP   CARDIAC: Regular       Irregular    Pericardial Rub   JVD          Monitored   Bedside   Remotely monitored  N/A  Rhythm:    EDEMA:  None  Generalized   Pitting  Facial   Pedal    UE   LE   SKIN:    Warm   Hot      Cold    Dry      Pale    Diaphoretic                   Flushed   Jaundiced   Cyanotic   Rash   Weeping   LOC:     Alert      Oriented:     Person      Place  Time                Confused   Lethargic   Medicated   Non-responsive     GI / ABDOMEN:   Flat     Distended     Soft     Firm     Obese                              Diarrhea   Bowel Sounds   Nausea   Vomiting      GU / URINE ASSESSMENT: Voiding    Oliguria   Anuria     Foley      Incontinent      Incontinent Brief        Bathroom Privileges     PAIN:***  0 Scale 0-10  Action/Follow Up:    MOBILITY:   Amb     Amb/Assist     Bed     Wheelchair   Stretcher      All Vitals and Treatment Details on Attached [facility]:           Room #       1st Time Acute   Stat   Routine   Urgent      Acute Room    Bedside   ICU/CCU   ER   Isolation Precautions:   Dialysis    Airborne    Contact     Reverse   Special Considerations:          Blood Consent Verified N/A     ALLERGIES:    NKA          Code Status:   Full Code   DNR   Other           HBsAg ONLY: Date Drawn          Negative Positive Unknown   HBsAb: Date      Susceptible    Immune10 Not Drawn   Drawn     Current Labs:    Date of Labs:  Today         Cut and paste current labs here.                                                                                                                                  DIET:   Renal     Other      NPO       Diabetic      PRIMARY NURSE REPORT: First initial/Last name/Title      Pre Dialysis:     Time:       EDUCATION:     Patient  Other         Knowledge Basis: None Minimal  Substantial   Barriers to learning  N/A    Access Care      S&S of infection      Fluid Management     K+     Procedural    Albumin      Medications      Tx Options      Transplant      Diet      Other   Teaching Tools:   Explain   Demo   Handouts  Video   Patient response:    Verbalized understanding   Teach back   Return demonstration  Requires follow up   Inappropriate due to            [place] ??? Before each treatment:     Machine Number:                   [facility]                                   Unit Machine #  with centralized RO                                   Portable Machine #1/RO serial # [id number]                                   Portable Machine #2/RO serial # [id number]                                   Portable Machine #3/RO serial # [id number]  [facility]                                   Portable Machine #11/RO serial # [id number]                                    Portable Machine #12/RO serial # [id number]                                   Portable Machine #13/RO serial #  [id number]      Alarm Test:  Pass time          Other:          RO/Machine Log Complete      Temp                Extracorporeal Circuit Tested for integrity   Dialysate: pH   Conductivity: Meter        HD Machine                     TCD:   Dialyzer Lot #             Blood Tubing Lot #           Saline Lot #       CHLORINE TESTING-Before each treatment and every 4 hours    Total Chlorine:  less than 0.1 ppm  Time:  4 Hr/2nd Check Time:    (if greater than 0.1 ppm from Primary then every 30 minutes from Secondary)     TREATMENT INITIATION ??? with Dialysis Precautions:    All Connections Secured                  Saline Line Double Clamped    Venous Parameters Set                   Arterial Parameters Set     Prime Given   ml               Air Foam Detector Engaged      Treatment Initiation Note:      Medication Dose Volume Route Initials Dialyzer Cleared:  Good  Fair   Poor    Blood processed:   L  UF Removed   Ml    Post Wt:     kg  POst BP:          Pulse:       Respirations:   Temperature:                                     Post Tx Vascular Access: AVF/AVG: Bleeding stopped Art  min. Ven.  Min   N/A                                   Catheter: Locking solution: Heparin 1:1000 Art.   Ven.   N/A                                 Post Assessment:  Skin:   Warm   Dry  Diaphoretic     Flushed   Pale  Cyanotic   [facility] Signatures Title Initials  Time Lungs:  Clear     Course   Crackles   Wheezing  Diminished       Cardiac:***  Regular    Irregular    Monitor   N/A  Rhythm:           Edema:***   None     General      Facial    Pedal     UE     LE       Pain:*** 0  1  2   3  4   5   6   7   8   9   10          Post Treatment Note: All information lost for dialysis, IT unable to retreive     POST TREATMENT PRIMARY NURSE HANDOFF REPORT:     First initial/Last name/Title         Post Dialysis: *** Time:  ***     Abbreviations: AVG-arterial venous graft, AVF-arterial venous fistula, IJ-Internal Jugular, Subcl-Subclavian, Fem-Femoral, Tx-treatment, AP/HR-apical heart rate, DFR-dialysate flow rate, BFR-blood flow rate, AP-arterial pressure, VP-venous pressure, UF-ultrafiltrate, TMP-transmembrane pressure, Ven-Venous, Art-Arterial, RO-Reverse Osmosis

## 2015-07-20 NOTE — ED Notes (Signed)
Pt hourly rounding competed.  Safety   Pt (x) resting on stretcher with side rails up and call bell in reach. Dialysis in progress.     () in chair    () in parents arms.  Toileting   Pt offered Declines need for toileting  Ongoing Updates  Updated on plan of care and status of test results.  Pain Management  Inquired as to comfort and offered comfort measures:    (x) warm blankets   () dimmed lights

## 2015-07-20 NOTE — ED Provider Notes (Signed)
HPI Comments: Pt with history of ESRD on HD, HTN, and medical non-compliance, presents to ED via POV after his family noted that he hasn't been eating for the past 3 days.  Brother states he has been dropping off food but "he hasn't eaten anything when I go back".  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  Pt is smiling but lethargic and doesn't answer any questions.  No other acute symptoms or complaints were noted.       Past Medical History:   Diagnosis Date   ??? Alcohol abuse    ??? Diastolic heart failure (HCC)    ??? Gastrointestinal bleeding    ??? Hepatitis C    ??? High blood pressure 01/13/2011   ??? History of echocardiogram 01/03/2011     EF 55-60%.  Gr 2 DDfx.  Increased RA pressure.  No R-L shunt.  Mild RAE.     ??? LE venous duplex 01/09/2011     No deep or superficial venous thrombosis in bilateral lower extremities.     ??? Migraine headache 01/13/2011       No past surgical history on file.      No family history on file.    Social History     Social History   ??? Marital status: SINGLE     Spouse name: N/A   ??? Number of children: N/A   ??? Years of education: N/A     Occupational History   ??? Not on file.     Social History Main Topics   ??? Smoking status: Current Every Day Smoker     Types: Cigarettes   ??? Smokeless tobacco: Never Used   ??? Alcohol use No   ??? Drug use: No   ??? Sexual activity: Yes     Partners: Female     Other Topics Concern   ??? Not on file     Social History Narrative   ??? No narrative on file         ALLERGIES: Review of patient's allergies indicates no known allergies.    Review of Systems   Unable to perform ROS: Acuity of condition   Skin: Negative for rash.       Vitals:    07/20/15 1343 07/20/15 1500 07/20/15 1515   BP: (!) 147/92 (!) 153/97 (!) 148/100   Pulse: 78  75   Resp: 16  20   Temp: 97 ??F (36.1 ??C)     SpO2: 99%              Physical Exam   Constitutional: He appears well-developed and well-nourished. No distress.   Resting comfortably on stretcher   HENT:    Head: Normocephalic and atraumatic.   MM moist   Eyes: Conjunctivae and EOM are normal. Pupils are equal, round, and reactive to light. No scleral icterus.   Sclera clear bilaterally   Neck: Normal range of motion. Neck supple. No JVD present.   Non-tender to palpation   Cardiovascular: Normal rate, regular rhythm and normal heart sounds.  Exam reveals no gallop and no friction rub.    No murmur heard.  LUE graft with good thrill   Pulmonary/Chest: Effort normal and breath sounds normal. No respiratory distress. He has no wheezes. He has no rales. He exhibits no tenderness.   No crepitance with palpation   Abdominal: Soft. Bowel sounds are normal. He exhibits no distension. There is no tenderness. There is no rebound and  no guarding.   Genitourinary:   Genitourinary Comments: No CVA tenderness   Musculoskeletal: He exhibits no edema or tenderness.   Normal inspection of upper extremities.  No edema noted to bilateral lower extremities   Lymphadenopathy:     He has no cervical adenopathy.   Neurological: No cranial nerve deficit. He exhibits normal muscle tone.   Somewhat lethargic but arousable with stimulation.  He is protecting his airway.  Normal motor and sensation bilaterally to upper and lower extremities   Skin: Skin is warm and dry. No rash noted. He is not diaphoretic.   Psychiatric:   Unable to evaluate due to underlying condition    Vitals reviewed.       MDM  Number of Diagnoses or Management Options  Diagnosis management comments: Pt with history of ESRD and non-compliant with HD since last week.  History of heroin use recently.  Somnolent but protecting his airway.  VS are unalarming.  Will check labs, CXR, EKG and CT head.    Pt with peaked T waves on EKG and POC chem with K of 8.0.  Hyperkalemia treated medically and nephrology paged for emergent dialysis.      Discussed with Dr. Regan Lemming, will arrange for dialysis.    Discussed with Dr. Awilda Metro, will admit.        Amount and/or Complexity of Data Reviewed  Clinical lab tests: ordered and reviewed  Tests in the radiology section of CPT??: ordered and reviewed  Tests in the medicine section of CPT??: ordered and reviewed  Decide to obtain previous medical records or to obtain history from someone other than the patient: yes  Obtain history from someone other than the patient: yes  Review and summarize past medical records: yes  Discuss the patient with other providers: yes  Independent visualization of images, tracings, or specimens: yes    Risk of Complications, Morbidity, and/or Mortality  Presenting problems: high  Management options: high    Patient Progress  Patient progress: stable    ED Course       Procedures    CXR:  No acute pulmonary process    EKG:  NSR, rate 124, wide QRS, peaked T waves diffusely.

## 2015-07-20 NOTE — Consults (Signed)
Consult Note  Consult requested by: dr Reola Calkins  Robert Pierce is a 44 y.o. male BLACK OR [place] who is being seen on consult for hyperkalemia  No chief complaint on file.    Admission diagnosis: <principal problem not specified>       HPI: 50 y o african [place] male with hx of esrd,on dialysis.had missed his last dialysis on sat.presented with altered mental status,heroin overdose.received several doses of narcan.noted to have severe hyperkalemia with severe ekg changes.given calcium,bic ,insulin.pt was admitted to this hospital in dec 6.was suppose to go dialyze with [place] while he,s visiting from [place]  Past Medical History   Diagnosis Date   ??? Alcohol abuse    ??? Diastolic heart failure (HCC)    ??? Gastrointestinal bleeding    ??? Hepatitis C    ??? High blood pressure 01/13/2011   ??? History of echocardiogram 01/03/2011     EF 55-60%.  Gr 2 DDfx.  Increased RA pressure.  No R-L shunt.  Mild RAE.     ??? LE venous duplex 01/09/2011     No deep or superficial venous thrombosis in bilateral lower extremities.     ??? Migraine headache 01/13/2011      No past surgical history on file.    Social History     Social History   ??? Marital status: SINGLE     Spouse name: N/A   ??? Number of children: N/A   ??? Years of education: N/A     Occupational History   ??? Not on file.     Social History Main Topics   ??? Smoking status: Current Every Day Smoker     Types: Cigarettes   ??? Smokeless tobacco: Never Used   ??? Alcohol use No   ??? Drug use: No   ??? Sexual activity: Yes     Partners: Female     Other Topics Concern   ??? Not on file     Social History Narrative   ??? No narrative on file       No family history on file.  No Known Allergies     Home Medications:     Prior to Admission Medications   Prescriptions Last Dose Informant Patient Reported? Taking?   albuterol (PROVENTIL HFA, VENTOLIN HFA, PROAIR HFA) 90 mcg/actuation inhaler   No No   Sig: Take 1 Puff by inhalation as needed.   amLODIPine (NORVASC) 10 mg tablet   No No    Sig: Take 1 Tab by mouth daily.   citalopram (CELEXA) 20 mg tablet   No No   Sig: Take 1 Tab by mouth daily.   hydrALAZINE (APRESOLINE) 25 mg tablet   No No   Sig: Take 1 Tab by mouth three (3) times daily.   metoprolol tartrate (LOPRESSOR) 50 mg tablet   No No   Sig: Take 1 Tab by mouth two (2) times a day.   multivitamin-iron, hematinic, (CEROVITE ADVANCED FORMULA) 27-0.4 mg Tab tablet   No No   Sig: Take 1 Tab by mouth daily.   pantoprazole (PROTONIX) 40 mg tablet   Yes No   Sig: Take 40 mg by mouth daily.     thiamine (B-1) 100 mg tablet   No No   Sig: Take 1 Tab by mouth daily.      Facility-Administered Medications: None       Current Facility-Administered Medications   Medication Dose Route Frequency   ??? naloxone (NARCAN) injection 0.4 mg  0.4 mg  IntraVENous EVERY 2 MINUTES AS NEEDED     Current Outpatient Prescriptions   Medication Sig   ??? hydrALAZINE (APRESOLINE) 25 mg tablet Take 1 Tab by mouth three (3) times daily.   ??? metoprolol tartrate (LOPRESSOR) 50 mg tablet Take 1 Tab by mouth two (2) times a day.   ??? amLODIPine (NORVASC) 10 mg tablet Take 1 Tab by mouth daily.   ??? albuterol (PROVENTIL HFA, VENTOLIN HFA, PROAIR HFA) 90 mcg/actuation inhaler Take 1 Puff by inhalation as needed.   ??? pantoprazole (PROTONIX) 40 mg tablet Take 40 mg by mouth daily.     ??? citalopram (CELEXA) 20 mg tablet Take 1 Tab by mouth daily.   ??? thiamine (B-1) 100 mg tablet Take 1 Tab by mouth daily.   ??? multivitamin-iron, hematinic, (CEROVITE ADVANCED FORMULA) 27-0.4 mg Tab tablet Take 1 Tab by mouth daily.       Review of Systems:   A comprehensive review of systems was negative except for that written in the HPI.  Data Review:    Labs: Results:       Chemistry No results for input(s): GLU, NA, K, CL, CO2, BUN, CREA, CA, AGAP, BUCR, TBIL, GPT, AP, TP, ALB, GLOB, AGRAT in the last 72 hours.   PTH  Lab Results   Component Value Date/Time    CALCIUM 9.3 07/15/2015 04:42 AM    PHOSPHORUS 8.0 07/13/2015 11:30 AM       CBC w/Diff Recent Labs      07/20/15   1545   WBC  15.7*   RBC  3.69*   HGB  10.4*   HCT  31.0*   PLT  149   GRANS  85*   LYMPH  11*   EOS  0      Coagulation Recent Labs      07/20/15   1545   PTP  17.9*   INR  1.5*   APTT  38.7*       Iron/Ferritin No results for input(s): IRON in the last 72 hours.    No lab exists for component: TIBCCALC   BNP No results for input(s): BNPP in the last 72 hours.   Cardiac Enzymes No results for input(s): CPK, CKND1, MYO in the last 72 hours.    No lab exists for component: CKRMB, TROIP   Liver Enzymes No results for input(s): TP, ALB, TBIL, AP, SGOT, GPT in the last 72 hours.    No lab exists for component: DBIL   Thyroid Studies Lab Results   Component Value Date/Time    TSH 1.35 01/01/2011 02:47 PM        Urinalysis Lab Results   Component Value Date/Time    COLOR YELLOW 01/03/2011 05:45 PM    APPEARANCE CLEAR 01/03/2011 05:45 PM    SPECIFIC GRAVITY 1.015 01/03/2011 05:45 PM    PH (UA) 5.0 01/03/2011 05:45 PM    PROTEIN NEGATIVE  01/03/2011 05:45 PM    GLUCOSE NEGATIVE  01/03/2011 05:45 PM    KETONE NEGATIVE  01/03/2011 05:45 PM    BILIRUBIN NEGATIVE  01/03/2011 05:45 PM    UROBILINOGEN 0.2 01/03/2011 05:45 PM    NITRITES NEGATIVE  01/03/2011 05:45 PM    LEUKOCYTE ESTERASE NEGATIVE  01/03/2011 05:45 PM    EPITHELIAL CELLS 1+ 01/03/2011 05:45 PM    BACTERIA FEW 12/30/2010 10:26 PM    WBC 4 to 10 01/03/2011 05:45 PM    RBC 0 to 3 01/03/2011 05:45 PM  IMAGES:   XR Results (maximum last 3):    Results from Hospital Encounter encounter on 07/20/15   XR CHEST PORT   Narrative EXAM: Chest Radiograph    INDICATION: Altered mental status. Patient on dialysis.    TECHNIQUE: AP view of the chest    COMPARISON: 07/13/2015, 01/04/2011, 01/02/2011     FINDINGS: No pneumothorax identified.  Bilateral interstitial infiltrates are  noted. Left greater than right. No effusions identified.  The cardiac silhouette  is enlarged. This is increased.   Cephalization of the pulmonary vessels are   noted. The osseous structures are unremarkable. A left axillary stent is noted.         Impression Impression:  1.  Mild to moderate interstitial pulmonary edema.         Results from Hospital Encounter encounter on 07/13/15   XR CHEST PORT   Narrative EXAMINATION: Chest portable    INDICATION: Shortness of breath    COMPARISON: 01/04/2011    FINDINGS: Frontal view of the chest obtained. No consolidation. Borderline  cardiac silhouette and slightly prominent perihilar interstitium. Left  subclavian vascular stent noted. No acute osseous findings.         Impression IMPRESSION:    1. Borderline cardiac silhouette size. Perhaps mild pulmonary venous congestion.        Results from Hospital Encounter encounter on 12/31/10   XR CHEST PA LAT   Narrative Ordering MD: Loleta Books MD  Signed By: Jannette Fogo MD  ** FINAL **  ---------------------------------------------------------------------  Procedure Date:  01/04/2011   Accession Number:  [id number]          Order No:   [id number]        Procedure:   RDV - CHEST  2 VIEWS       CPT Code:   71020     Admit Diag:   FEVER STOMACH PAIN             Reason:   PA LAT RENAL FAILURE  INTERPRETATION:  Chest Two Views  CPT CODE: 45409  HISTORY: fever Stomach pain. Preoperative vertebroplasty.  TECHNIQUE: PA and lateral.  COMPARISON: Chest radiograph 01/02/2011  FINDINGS:  Bilateral perihilar and basilar opacities show complete clearing on   the right. Small focus of patchy opacity remains in the peripheral   right perihilar region. Right base shows thinning. The heart is   enlarged. Pulmonary veins appear normal. The right lateral sulcus is   blunted consistent with small pleural effusion. Bones appear   unremarkable.  IMPRESSION:  Resolved pulmonary edema.  Small peripheral focus patchy opacity right perihilar region favors   atelectasis or residual edema. Small right pleural effusion.        CT Results (maximum last 3):     Results from Hospital Encounter encounter on 12/31/10   CT ABDMEN/PELVIS   Narrative Ordering MD: Jerre Simon MD  Signed By: Marshia Ly MD  ** FINAL **  ---------------------------------------------------------------------  Procedure Date:  12/31/2010   Accession Number:  [id number]          Order No:   [id number]        Procedure:   [facility] - ABDOMEN/PELVIS       CPT Code:   82956     Admit Diag:   FEVER STOMACH PAIN             Reason:   ABDOMINAL PAIN  INTERPRETATION:  CT Abdomen and Pelvis without contrast  INDICATION: Fever and abdominal pain.  TECHNIQUE: 5 mm collimation axial images obtained from the diaphragm   to the level of the pubic symphysis without nonionic intravenous   contrast.    COMPARISON: None.  ABDOMEN FINDINGS:  Lung Bases: Microatelectasis the poster costophrenic angles. No   pleural effusions. Heart is mildly enlarged a pericardial effusion.  Liver: Normal in attenuation without mass.  Gallbladder: Present and appears normal  No ductal dilatation.  Pancreas: Normal attenuation without mass.  Spleen: Normal.  Adrenal Glands: No evidence for mass.  Kidneys: Both kidneys appear edematous. No evidence for cortical   mass. Tiny calcification present in the right kidney. There is mild   bilateral perinephric inflammation.  No hydronephrosis.  Lymph Nodes: There are small periaortic lymph nodes.  Stomach and proximal small bowel appears normal.  PELVIS FINDINGS:   Bowel:   Small Bowel: Normal in caliber with normal wall thickness.  Large Bowel: Normal in caliber with normal wall thickness.  Appendix: Normal.  Bladder: Collapsed around a Foley catheter.  Ureters: No ureteral dilatation.  Lymph Nodes: No lymphadenopathy.  Bones: Multiple metal fragments are present in the right hip.   Dynamic compression screw present in the right femur. There are no   focal osseous lesions.  IMPRESSION:  1.  Bilateral renal edema and perinephric inflammation suggestive of    infectious/inflammatory process. Tiny nonobstructing calculus in the   right kidney. No evidence for obstructive uropathy..  2.  No evidence for bowel obstruction.  3.  Postoperative changes of the right femur as described above.        MRI Results (maximum last 3):  No results found for this or any previous visit.    Nuclear Medicine Results (maximum last 3):  No results found for this or any previous visit.    [place] Results (maximum last 3):  No results found for this or any previous visit.    DEXA Results (maximum last 3):  No results found for this or any previous visit.    MAM Results (maximum last 3):  No results found for this or any previous visit.    IR Results (maximum last 3):  No results found for this or any previous visit.    VAS/US Results (maximum last 3):    Results from Abstract encounter on 01/27/11   DUPLEX LOWER EXT VENOUS BILAT    PET Results (maximum last 3):  No results found for this or any previous visit.    No results found for this or any previous visit.  @LASTPROCAMB ([id number])    CULTURE:   )No results for input(s): SDES, CULT in the last 72 hours.  No results for input(s): CULT in the last 72 hours.    Physical Assessment:     Visit Vitals   ??? BP (!) 147/92 (BP 1 Location: Left arm, BP Patient Position: At rest)   ??? Pulse 78   ??? Temp 97 ??F (36.1 ??C)   ??? Resp 16   ??? SpO2 99%     There were no vitals filed for this visit.  No intake or output data in the 24 hours ending 07/20/15 1651    Physial Exam:  General appearance: mild distress.lethargic,but arousable  Skin: normal coloration and turgor, no rashes, no suspicious skin lesions noted.  HEENT: Head; normocephalic, atraumatic. PERLA. ENT- ENT exam normal, no neck nodes or sinus tenderness.   Lungs: rales bil  Heart: regular rate and rhythm, S1, S2 normal, no murmur, click, rub or gallop  Abdomen: soft, non-tender. Bowel sounds  normal. No masses,  no organomegaly  Extremities: extremities normal, atraumatic, no cyanosis or edema     PLAN / RECOMMENDATION:    Esrd,reviewed medical records and labs.has life threatining hyperkalemia,needs urgent dialysis.arrangements are made to dialyze in er  Fluid overload  htn  Heroin overdose     Thank you for the consultation to participate in patient's care. I have personally discussed my plan with the referring physician.     Robert CoKeith Dason Mosley, MD  July 20, 2015

## 2015-07-20 NOTE — H&P (Addendum)
Hospitalist Admission History and Physical    NAME:  Robert Pierce   DOB:   Nov 09, 1970   MRN:   [id number]     PCP:  None  Date/Time:  07/20/2015 9:14 PM  Subjective:   CHIEF COMPLAINT:  lethargy    HISTORY OF PRESENT ILLNESS:     Robert Pierce is a 44 y.o.  With pmhx of heroine abuse, esrd on hd, htn, alcohol abuse was brought to the hospital for evaluation of change in mental status. Patient was recently discharged from here after being admitted for sob secondary missing his dialysis. Upon discharge, with the help of Nephrologist, his outpatient HD was set up but he did not go. Over the course of last few days his brother noticed decline in his mental status, he had also used heroine during this time period. No other complaints including sob, chest pain, lightheadedness.   Upon arrival to the ED he had potassium of 8.0 with EKG changed of peaked T waves. He was give Joyce Gross ex, insulin, albuterol, D50. He was also given Narcan with slightly improvement in his mental status. Nephrology was consulted who starte the patient on HD. Decision was made to admit him.  When I arrived at bedside he continues to be drowsy but he is easily arousable. Unable to keep up the conversation but denies any complaints at this time.         Past Medical History   Diagnosis Date   ??? Alcohol abuse    ??? Diastolic heart failure (HCC)    ??? Gastrointestinal bleeding    ??? Hepatitis C    ??? High blood pressure 01/13/2011   ??? History of echocardiogram 01/03/2011     EF 55-60%.  Gr 2 DDfx.  Increased RA pressure.  No R-L shunt.  Mild RAE.     ??? LE venous duplex 01/09/2011     No deep or superficial venous thrombosis in bilateral lower extremities.     ??? Migraine headache 01/13/2011        No past surgical history on file.    Social History   Substance Use Topics   ??? Smoking status: Current Every Day Smoker     Types: Cigarettes   ??? Smokeless tobacco: Never Used   ??? Alcohol use No        No family history on file.     No Known Allergies      Prior to Admission Medications   Prescriptions Last Dose Informant Patient Reported? Taking?   albuterol (PROVENTIL HFA, VENTOLIN HFA, PROAIR HFA) 90 mcg/actuation inhaler   No No   Sig: Take 1 Puff by inhalation as needed.   amLODIPine (NORVASC) 10 mg tablet   No No   Sig: Take 1 Tab by mouth daily.   citalopram (CELEXA) 20 mg tablet   No No   Sig: Take 1 Tab by mouth daily.   hydrALAZINE (APRESOLINE) 25 mg tablet   No No   Sig: Take 1 Tab by mouth three (3) times daily.   metoprolol tartrate (LOPRESSOR) 50 mg tablet   No No   Sig: Take 1 Tab by mouth two (2) times a day.   multivitamin-iron, hematinic, (CEROVITE ADVANCED FORMULA) 27-0.4 mg Tab tablet   No No   Sig: Take 1 Tab by mouth daily.   pantoprazole (PROTONIX) 40 mg tablet   Yes No   Sig: Take 40 mg by mouth daily.     thiamine (B-1) 100 mg tablet   No No  Sig: Take 1 Tab by mouth daily.      Facility-Administered Medications: None       REVIEW OF SYSTEMS:      Unable to obtain  ROS due to  mental status change  sedated   intubated   Total of 12 systems reviewed as follows:  Constitutional:  negative fever, negative chills, negative weight loss  Eyes:   negative visual changes  ENT:   negative sore throat, tongue or lip swelling  Respiratory:  negative cough, negative dyspnea  Cards:   negative for chest pain, palpitations, lower extremity edema  GI:   negative for nausea, vomiting, diarrhea, and abdominal pain  Genitourinary: negative for frequency, dysuria  Integument:  negative for rash and pruritus  Hematologic:  negative for easy bruising and gum/nose bleeding  Musculoskel: negative for myalgias,  back pain and muscle weakness  Neurological:  negative for headaches, dizziness, vertigo  Behavl/Psych:  negative for feelings of anxiety, depression     Pertinent Positives include : per hpi     Objective:   VITALS:    Visit Vitals   ??? BP (!) 148/100   ??? Pulse 75   ??? Temp 97 ??F (36.1 ??C)   ??? Resp 20   ??? SpO2 99%      Temp (24hrs), Avg:97 ??F (36.1 ??C), Min:97 ??F (36.1 ??C), Max:97 ??F (36.1 ??C)      PHYSICAL EXAM:   General:    drowsy    Head:   Normocephalic, without obvious abnormality, atraumatic.  Eyes:   Conjunctivae clear, anicteric sclerae.  Pupils are equal  Nose:  Nares normal. No drainage or sinus tenderness.  Throat:    Lips, mucosa, and tongue normal.  No Thrush  Neck:  Supple, symmetrical,  no adenopathy, thyroid: non tender    no carotid bruit and no JVD.  Back:    Symmetric,  No CVA tenderness.  Lungs:   B/l basilar crackles   Chest wall:  No tenderness or deformity. No Accessory muscle use.  Heart:   Regular rate and rhythm,  no murmur, rub or gallop.  Abdomen:   Soft, non-tender. Not distended.  Bowel sounds normal. No masses  Extremities: Extremities normal, atraumatic, No cyanosis.  No edema. No clubbing  Skin:     Texture, turgor normal. No rashes or lesions.  Not Jaundiced  Lymph nodes: Cervical, supraclavicular normal.  Psych:  Good insight.  Not depressed.  Not anxious or agitated.  Neurologic: Unable to perform full neuro exam due to his mental status Alert and oriented X 1.       LAB DATA REVIEWED:    Lab Results   Component Value Date/Time    GLUCOSE (POC) 103 07/20/2015 03:27 PM    GLUCOSE (POC) 159 02/13/2011 10:13 PM    GLUCOSE (POC) 108 01/02/2011 02:27 AM     Recent Labs      07/20/15   1545   NA  127*   K  7.9*   CL  85*   CO2  13*   BUN  146*   CREA  23.40*   GLU  100*   CA  6.6*   ALB  4.1   WBC  15.7*   HGB  10.4*   HCT  31.0*   PLT  149             Assessment/Plan:      Principal Problem:    Altered mental status (07/20/2015)    Active Problems:  High blood pressure (01/13/2011)      Hyperkalemia (07/20/2015)      ESRD needing dialysis (HCC) (07/20/2015)      Drug abuse (07/20/2015)      Altered mental state (07/20/2015)       ___________________________________________________  PLAN:    Risk of deterioration:  Low    Moderate  High    Altered Mental Status   -due to metabolic encephalopathy from uremia and lytes and also drug overdose  -narcan given in ED  -cont neurochecks   -HD started   -CT head is negative today     HyperKalemia with EKG changes   -albuterol, kayex, Insulin, D50 given   -currently on HD  -will repeat labs after, ekg improved   -may need another HD tomorrow   -will check labs again tomorrow morning along with phos     Transaminitis  -hepatocellular/ alcohol abuse in pattern, appears to be acute  -his labs from last visit were normal   -Alcohol abuse in pattern vs shock liver vs vascular congestion. Does have hx of hep C per records   -will send acute hepatitis panel; RUQ US; tylenol and salicylate levels   -recheck CMP in morning   -spoke with Dr Reesa ChewNeumann from GI, appreciate his recs. Agrees with current plan, just cont to monitor his LFTs for now. Dr Hart RochesterLawson will see the patient tomorrow.     Elevated Trop  -likely demand ischemia from fluids overload  -trend for now  -check ekg; only peak t waves   -chest pain free     Hyponatremia  -likely due to hypervolemic hyponatremia  -monitor for now; caution with overcorrection     ESRD on HD  -nephro on board   -renal diet when his mental status is better   -monitor lytes   -avoid nephrotoxic drugs   -may need another HD session again tomorrow     HTN  -cont his home meds for now     COPD  -cont albuterol     Alcohol Abuse  -hx of alcohol abuse, EtOH levels neg today     Heroine abuse  -counsel when he is more alert         Prophylaxis:  Lovenox  Coumadin  Hep SQ  SCD???s  H2B/PPI    Disposition:  Home w/ Family   HH PT,OT,RN   SNF/LTC   SAH/Rehab    Discussed Code Status:    Full Code      DNR     ___________________________________________________    Care Plan discussed with:    Patient   Family    ED Care Manager  ED Doc   Specialist :    Total Time Coordinating Admission:  70    minutes    Total Critical Care Time:     ___________________________________________________   Admitting Physician: Stephania FragminAnkit Sanjuan Sawa, MD

## 2015-07-20 NOTE — ED Notes (Signed)
Report received. Care of pt assumed at this time. Pt currently receiving dialysis. Pt does not produce urine and Dr. Reola CalkinsBeck aware.

## 2015-07-20 NOTE — ED Notes (Signed)
Dialysis completed, ED tech at bedside drawing labs. INT patent and free from s/sx of inflammation or infiltration.

## 2015-07-20 NOTE — Progress Notes (Signed)
HEMODIALYSIS ROUNDING NOTE      Patient: Robert Pierce               Sex: male          DOA: 07/20/2015  1:51 PM        Date of Birth:  Apr 07, 1971      Age:  44 y.o.        LOS:  LOS: 0 days     Subjective:     Robert Pierce is a 44 y.o. African American who presents with Hyperkalemia  ESRD needing dialysis (HCC)  Altered mental status.  The patient is dialyzing utilizing the following method:Intermittent Hemodialysis    Chief Complains: Patient was seen on dialysis  - Reviewed last 24 hrs events     Current Facility-Administered Medications   Medication Dose Route Frequency   ??? naloxone (NARCAN) injection 0.4 mg  0.4 mg IntraVENous EVERY 2 MINUTES AS NEEDED     Current Outpatient Prescriptions   Medication Sig   ??? hydrALAZINE (APRESOLINE) 25 mg tablet Take 1 Tab by mouth three (3) times daily.   ??? metoprolol tartrate (LOPRESSOR) 50 mg tablet Take 1 Tab by mouth two (2) times a day.   ??? amLODIPine (NORVASC) 10 mg tablet Take 1 Tab by mouth daily.   ??? albuterol (PROVENTIL HFA, VENTOLIN HFA, PROAIR HFA) 90 mcg/actuation inhaler Take 1 Puff by inhalation as needed.   ??? pantoprazole (PROTONIX) 40 mg tablet Take 40 mg by mouth daily.     ??? citalopram (CELEXA) 20 mg tablet Take 1 Tab by mouth daily.   ??? thiamine (B-1) 100 mg tablet Take 1 Tab by mouth daily.   ??? multivitamin-iron, hematinic, (CEROVITE ADVANCED FORMULA) 27-0.4 mg Tab tablet Take 1 Tab by mouth daily.       Objective:     Visit Vitals   ??? BP (!) 148/100   ??? Pulse 75   ??? Temp 97 ??F (36.1 ??C)   ??? Resp 20   ??? SpO2 99%     No intake or output data in the 24 hours ending 07/20/15 1809    Physical Examination:    GEN: waking up  RS: Chest is bilateral equal, no wheezing / rales / crackles  CVS: S1-S2 heard, RRR  Abdomen: Soft, Non tender, Not distended, Positive bowel sounds  Extremities: No edema, no cyanosis, skin is warm on touch  HEENT: Head is atraumatic, PERRLA, conjunctiva pink & non icteric. No JVD  or carotid bruit      Data Review:      Labs:     Hematology: Recent Labs      07/20/15   1545   WBC  15.7*   HGB  10.4*   HCT  31.0*     Chemistry: Recent Labs      07/20/15   1545   BUN  146*   CREA  23.40*   CA  6.6*   ALB  4.1   K  7.9*   NA  127*   CL  85*   CO2  13*   GLU  100*        Images:     XR (Most Recent). CXR reviewed by me and compared with previous CXR   Results from Hospital Encounter encounter on 07/20/15   XR CHEST PORT   Narrative EXAM: Chest Radiograph    INDICATION: Altered mental status. Patient on dialysis.    TECHNIQUE: AP view of the chest    COMPARISON:  07/13/2015, 01/04/2011, 01/02/2011     FINDINGS: No pneumothorax identified.  Bilateral interstitial infiltrates are  noted. Left greater than right. No effusions identified.  The cardiac silhouette  is enlarged. This is increased.   Cephalization of the pulmonary vessels are  noted. The osseous structures are unremarkable. A left axillary stent is noted.         Impression Impression:  1.  Mild to moderate interstitial pulmonary edema.            CT (Most Recent)   Results from Hospital Encounter encounter on 07/20/15   CT HEAD WO CONT   Narrative EXAM: CT of the Head without contrast    INDICATION: Unsteady gait and altered mental status.    TECHNIQUE: CT of the head from the vertex to the skull base performed. No IV  contrast administered.    All CT scans at this facility are performed using dose optimization technique as  appropriate to a performed exam, to include automated exposure control,  adjustment of the mA and/or kV according to patient size (including appropriate  matching for site specific examination) or use of iterative reconstruction  technique.    COMPARISON: None.    FINDINGS: No evidence of acute intra-axial or extra-axial hemorrhage.  The  ventricles and sulci are symmetric.  No midline shift, mass effect or mass  lesion appreciated.  The gray-white junction is preserved. No evidence of an   acute infarct identified. The mastoid air cells are well aerated. The paranasal  sinuses are unremarkable. The orbits are normal. The scalp and skull are  unremarkable.         Impression IMPRESSION:  1.   No acute intracranial process identified.             Plan / Recommendation:         End Stage Renal Disease:  Plan HD today    At 6:09 PM on 07/20/2015, I saw and examined patient during hemodialysis treatment. The patient was receiving hemodialysis for treatment of end stage renal disease. I have also reviewed vital signs, intake and output, lab results and recent events, and agreed with today's dialysis order.    Access: No issue    Anemia:no epo tonight      Lyanne Co, MD  Nephrology  07/20/2015

## 2015-07-20 NOTE — ED Notes (Signed)
Pt continues to receive dialysis. Responding appropriately to this RN, no needs expressed at this time. Will continue to monitor.

## 2015-07-21 ENCOUNTER — Inpatient Hospital Stay: Admit: 2015-07-21 | Payer: MEDICAID

## 2015-07-21 LAB — CBC WITH AUTOMATED DIFF
ABS. BASOPHILS: 0 10*3/uL (ref 0.0–0.1)
ABS. EOSINOPHILS: 0 10*3/uL (ref 0.0–0.4)
ABS. LYMPHOCYTES: 0.9 10*3/uL (ref 0.9–3.6)
ABS. MONOCYTES: 1.5 10*3/uL — ABNORMAL HIGH (ref 0.05–1.2)
ABS. NEUTROPHILS: 12 10*3/uL — ABNORMAL HIGH (ref 1.8–8.0)
BASOPHILS: 0 % (ref 0–2)
EOSINOPHILS: 0 % (ref 0–5)
HCT: 33.4 % — ABNORMAL LOW (ref 36.0–48.0)
HGB: 11.2 g/dL — ABNORMAL LOW (ref 13.0–16.0)
LYMPHOCYTES: 6 % — ABNORMAL LOW (ref 21–52)
MCH: 28.1 PG (ref 24.0–34.0)
MCHC: 33.5 g/dL (ref 31.0–37.0)
MCV: 83.7 FL (ref 74.0–97.0)
MONOCYTES: 10 % (ref 3–10)
MPV: 11.1 FL (ref 9.2–11.8)
NEUTROPHILS: 84 % — ABNORMAL HIGH (ref 40–73)
PLATELET: 142 10*3/uL (ref 135–420)
RBC: 3.99 M/uL — ABNORMAL LOW (ref 4.70–5.50)
RDW: 14.8 % — ABNORMAL HIGH (ref 11.6–14.5)
WBC: 14.4 10*3/uL — ABNORMAL HIGH (ref 4.6–13.2)

## 2015-07-21 LAB — METABOLIC PANEL, BASIC
Anion gap: 17 mmol/L (ref 3.0–18)
Anion gap: 16 mmol/L (ref 3.0–18)
BUN/Creatinine ratio: 5 — ABNORMAL LOW (ref 12–20)
BUN/Creatinine ratio: 5 — ABNORMAL LOW (ref 12–20)
BUN: 61 MG/DL — ABNORMAL HIGH (ref 7.0–18)
BUN: 68 MG/DL — ABNORMAL HIGH (ref 7.0–18)
CO2: 28 mmol/L (ref 21–32)
CO2: 25 mmol/L (ref 21–32)
Calcium: 8.8 MG/DL (ref 8.5–10.1)
Calcium: 7.6 MG/DL — ABNORMAL LOW (ref 8.5–10.1)
Chloride: 90 mmol/L — ABNORMAL LOW (ref 100–108)
Chloride: 92 mmol/L — ABNORMAL LOW (ref 100–108)
Creatinine: 11.6 MG/DL — ABNORMAL HIGH (ref 0.6–1.3)
Creatinine: 13.2 MG/DL — ABNORMAL HIGH (ref 0.6–1.3)
GFR est AA: 6 mL/min/{1.73_m2} — ABNORMAL LOW (ref >60)
GFR est AA: 5 mL/min/{1.73_m2} — ABNORMAL LOW (ref >60)
GFR est non-AA: 5 mL/min/{1.73_m2} — ABNORMAL LOW (ref >60)
GFR est non-AA: 4 mL/min/{1.73_m2} — ABNORMAL LOW (ref >60)
Glucose: 45 mg/dL — ABNORMAL LOW (ref 74–99)
Glucose: 60 mg/dL — ABNORMAL LOW (ref 74–99)
Potassium: 4.1 mmol/L (ref 3.5–5.5)
Potassium: 4.8 mmol/L (ref 3.5–5.5)
Sodium: 135 mmol/L — ABNORMAL LOW (ref 136–145)
Sodium: 133 mmol/L — ABNORMAL LOW (ref 136–145)

## 2015-07-21 LAB — HEPATITIS PANEL, ACUTE
Hep B surface Ag Interp.: NEGATIVE
Hep C virus Ab Interp.: POSITIVE — AB
Hepatitis A, IgM: NEGATIVE
Hepatitis B core, IgM: NEGATIVE
Hepatitis B surface Ag: <0.1 Index (ref <1.00)
Hepatitis C virus Ab: >11 Index — ABNORMAL HIGH (ref <0.80)

## 2015-07-21 LAB — HEP B SURFACE AG
Hep B surface Ag Interp.: NEGATIVE
Hepatitis B surface Ag: <0.1 Index (ref <1.00)

## 2015-07-21 LAB — EKG, 12 LEAD, INITIAL
Atrial Rate: 124 {beats}/min
Calculated R Axis: -64 degrees
Calculated T Axis: 63 degrees
Q-T Interval: 294 ms
QRS Duration: 160 ms
QTC Calculation (Bezet): 422 ms
Ventricular Rate: 124 {beats}/min

## 2015-07-21 LAB — CARDIAC PANEL,(CK, CKMB & TROPONIN)
CK - MB: 3.3 ng/ml (ref 0.5–3.6)
CK - MB: 2.5 ng/ml (ref 0.5–3.6)
CK-MB Index: 0.6 % (ref 0.0–4.0)
CK-MB Index: 0.5 % (ref 0.0–4.0)
CK: 593 U/L — ABNORMAL HIGH (ref 39–308)
CK: 549 U/L — ABNORMAL HIGH (ref 39–308)
Troponin-I, QT: 0.09 NG/ML — ABNORMAL HIGH (ref 0.0–0.045)
Troponin-I, QT: 0.13 NG/ML — ABNORMAL HIGH (ref 0.0–0.045)

## 2015-07-21 LAB — HEP B SURFACE AB
Hep B surface Ab Interp.: NEGATIVE — AB
Hepatitis B surface Ab: <3.1 m[IU]/mL — ABNORMAL LOW (ref >10.0)

## 2015-07-21 LAB — PHOSPHORUS: Phosphorus: 8.1 MG/DL — ABNORMAL HIGH (ref 2.5–4.9)

## 2015-07-21 LAB — MAGNESIUM: Magnesium: 2.6 mg/dL — ABNORMAL HIGH (ref 1.8–2.4)

## 2015-07-21 LAB — ACETAMINOPHEN: Acetaminophen level: <2 ug/mL — ABNORMAL LOW (ref 10–30)

## 2015-07-21 LAB — SALICYLATE: Salicylate level: 3 MG/DL (ref 2.8–20.0)

## 2015-07-21 LAB — HEPATITIS B SURFACE ANTIBODY
Hep B S Ab Interp: NEGATIVE — AB
Hep B S Ab: <3.1 m[IU]/mL — ABNORMAL LOW (ref >10.0)

## 2015-07-21 MED ORDER — CITALOPRAM 20 MG TAB
20 mg | Freq: Every day | ORAL | Status: DC
Start: 2015-07-21 — End: 2015-07-24
  Administered 2015-07-21 – 2015-07-24 (×4): via ORAL

## 2015-07-21 MED ORDER — AMLODIPINE 10 MG TAB
10 mg | Freq: Every day | ORAL | Status: DC
Start: 2015-07-21 — End: 2015-07-24
  Administered 2015-07-21 – 2015-07-24 (×3): via ORAL

## 2015-07-21 MED ORDER — ALBUTEROL SULFATE 0.083 % (0.83 MG/ML) SOLN FOR INHALATION
2.5 mg /3 mL (0.083 %) | RESPIRATORY_TRACT | Status: DC | PRN
Start: 2015-07-21 — End: 2015-07-24

## 2015-07-21 MED ORDER — ALBUTEROL SULFATE HFA 90 MCG/ACTUATION AEROSOL INHALER
90 mcg/actuation | RESPIRATORY_TRACT | Status: DC | PRN
Start: 2015-07-21 — End: 2015-07-21

## 2015-07-21 MED ORDER — HYDRALAZINE 25 MG TAB
25 mg | Freq: Three times a day (TID) | ORAL | Status: DC
Start: 2015-07-21 — End: 2015-07-24
  Administered 2015-07-21 – 2015-07-24 (×9): via ORAL

## 2015-07-21 MED ORDER — THERAPEUTIC MULTIVITAMIN TAB
Freq: Every day | ORAL | Status: DC
Start: 2015-07-21 — End: 2015-07-24
  Administered 2015-07-21 – 2015-07-24 (×4): via ORAL

## 2015-07-21 MED ORDER — LIDOCAINE (PF) 10 MG/ML (1 %) IJ SOLN
10 mg/mL (1 %) | INTRAMUSCULAR | Status: AC
Start: 2015-07-21 — End: 2015-07-21
  Administered 2015-07-21: 16:00:00

## 2015-07-21 MED ORDER — PANTOPRAZOLE 40 MG TAB, DELAYED RELEASE
40 mg | Freq: Every day | ORAL | Status: DC
Start: 2015-07-21 — End: 2015-07-24
  Administered 2015-07-21 – 2015-07-24 (×4): via ORAL

## 2015-07-21 MED ORDER — CALCIUM ACETATE 667 MG CAP
667 mg | Freq: Three times a day (TID) | ORAL | Status: DC
Start: 2015-07-21 — End: 2015-07-24
  Administered 2015-07-21 – 2015-07-24 (×10): via ORAL

## 2015-07-21 MED ORDER — THIAMINE HCL 100 MG TAB
100 mg | Freq: Every day | ORAL | Status: DC
Start: 2015-07-21 — End: 2015-07-24
  Administered 2015-07-21 – 2015-07-24 (×4): via ORAL

## 2015-07-21 MED ORDER — METOPROLOL TARTRATE 50 MG TAB
50 mg | Freq: Two times a day (BID) | ORAL | Status: DC
Start: 2015-07-21 — End: 2015-07-24
  Administered 2015-07-21 – 2015-07-24 (×6): via ORAL

## 2015-07-21 MED FILL — LIDOCAINE (PF) 10 MG/ML (1 %) IJ SOLN: 10 mg/mL (1 %) | INTRAMUSCULAR | Qty: 5

## 2015-07-21 MED FILL — THERA 400 MCG TABLET: 400 mcg | ORAL | Qty: 1

## 2015-07-21 MED FILL — HYDRALAZINE 25 MG TAB: 25 mg | ORAL | Qty: 1

## 2015-07-21 MED FILL — CITALOPRAM 20 MG TAB: 20 mg | ORAL | Qty: 1

## 2015-07-21 MED FILL — AMLODIPINE 10 MG TAB: 10 mg | ORAL | Qty: 1

## 2015-07-21 MED FILL — METOPROLOL TARTRATE 50 MG TAB: 50 mg | ORAL | Qty: 1

## 2015-07-21 MED FILL — PANTOPRAZOLE 40 MG TAB, DELAYED RELEASE: 40 mg | ORAL | Qty: 1

## 2015-07-21 MED FILL — VITAMIN B-1 100 MG TABLET: 100 mg | ORAL | Qty: 1

## 2015-07-21 MED FILL — VENTOLIN HFA 90 MCG/ACTUATION AEROSOL INHALER: 90 mcg/actuation | RESPIRATORY_TRACT | Qty: 8

## 2015-07-21 MED FILL — CALCIUM ACETATE 667 MG CAP: 667 mg | ORAL | Qty: 4

## 2015-07-21 NOTE — Other (Signed)
IDR/SLIDR Summary          Patient: Robert Pierce MRN: [id number]    Age: 44 y.o.     Birthdate: 1970-09-21 Room/Bed: 452/01   Admit Diagnosis: Hyperkalemia  ESRD needing dialysis (HCC)  Altered mental status  Hyperkalemia  Altered mental state  Principal Diagnosis: Altered mental status     Goals: alter mental status, safety, oriented to self and place, abnormal lab. hemodialysis  Readmission: YES  Quality Measure: Not applicable  VTE Prophylaxis: Chemical  Influenza Vaccine screening completed? YES  Pneumococcal Vaccine screening completed? YES  Mobility needs: Yes   Nutrition plan:No  Consults:Case Management    Financial concerns:No  Escalated to CM? YES  RRAT Score: 15   Inter[facility]   Testing due for pt today? YES  LOS: 1 days Expected length of stay 2 days  Discharge plan: home   PCP: None  Transportation needs: No    Days before discharge:one day until discharge   Discharge disposition: Home    Signed:     Darden Amberva M Shelton  07/21/2015  6:55 AM

## 2015-07-21 NOTE — Progress Notes (Signed)
Chaplain conducted an initial consultation and Spiritual Assessment for Robert Pierce, who is a 44 y.o.,male. Patient???s Primary Language is: [place].   According to the patient???s EMR Religious Affiliation is: Baptist.     The reason the Patient came to the hospital is:   Patient Active Problem List    Diagnosis Date Noted   ??? Transaminasemia 07/21/2015   ??? Hyperkalemia 07/20/2015   ??? Altered mental status 07/20/2015   ??? ESRD needing dialysis (HCC) 07/20/2015   ??? Drug abuse 07/20/2015   ??? Altered mental state 07/20/2015   ??? ESRD (end stage renal disease) (HCC) 07/13/2015   ??? Shortness of breath 07/13/2015   ??? Iron deficiency anemia 07/13/2015   ??? ETOH abuse 02/21/2011   ??? High blood pressure 01/13/2011   ??? Migraine headache 01/13/2011   ??? Borderline high cholesterol 01/13/2011   ??? Diastolic heart failure (HCC) 01/13/2011        The Chaplain provided the following Interventions:  Initiated a relationship of care and support.   Explored issues of faith, belief, spirituality and religious/ritual needs while hospitalized.  Listened empathically.  Provided chaplaincy education.  Provided information about Spiritual Care Services.  Offered prayer and assurance of continued prayers on patient's behalf.   Chart reviewed.    The following outcomes where achieved:  Patient shared limited information about both their medical narrative and spiritual journey/beliefs.  Chaplain confirmed Patient's Religious Affiliation.  Patient processed feeling about current hospitalization.  Patient expressed gratitude for chaplain's visit.    Assessment:  Patient does not have any religious/cultural needs that will affect patient???s preferences in health care.  There are no spiritual or religious issues which require intervention at this time.     Plan:  Chaplains will continue to follow and will provide pastoral care on an as needed/requested basis.  Chaplain recommends bedside caregivers page chaplain on duty if patient  shows signs of acute spiritual or emotional distress.    Chaplain Qiara Minetti G. [facility]  Chaplain  Spiritual Care Department  [phone]

## 2015-07-21 NOTE — Progress Notes (Addendum)
HEMODIALYSIS ROUNDING NOTE      Patient: Robert Pierce               Sex: male          DOA: 07/20/2015  1:51 PM        Date of Birth:  02-Jun-1971      Age:  44 y.o.        LOS:  LOS: 1 day     Subjective:     Robert Pierce is a 44 y.o. African American who presents with Hyperkalemia  ESRD needing dialysis (HCC)  Altered mental status  Hyperkalemia  Altered mental state.  The patient is dialyzing utilizing the following method:Intermittent Hemodialysis    Chief Complains: Patient was seen on dialysis  - Reviewed last 24 hrs events     Current Facility-Administered Medications   Medication Dose Route Frequency   ??? amLODIPine (NORVASC) tablet 10 mg  10 mg Oral DAILY   ??? citalopram (CELEXA) tablet 20 mg  20 mg Oral DAILY   ??? hydrALAZINE (APRESOLINE) tablet 25 mg  25 mg Oral TID   ??? metoprolol tartrate (LOPRESSOR) tablet 50 mg  50 mg Oral BID   ??? therapeutic multivitamin (THERAGRAN) tablet 1 Tab  1 Tab Oral DAILY   ??? pantoprazole (PROTONIX) tablet 40 mg  40 mg Oral DAILY   ??? thiamine (B-1) tablet 100 mg  100 mg Oral DAILY   ??? albuterol (PROVENTIL VENTOLIN) nebulizer solution 2.5 mg  2.5 mg Nebulization Q4H PRN   ??? calcium acetate (PHOSLO) capsule 2,668 mg  4 Cap Oral TID WITH MEALS   ??? naloxone (NARCAN) injection 0.4 mg  0.4 mg IntraVENous EVERY 2 MINUTES AS NEEDED       Objective:     Visit Vitals   ??? BP 157/73   ??? Pulse 82   ??? Temp 97.2 ??F (36.2 ??C) (Tympanic)   ??? Resp 18   ??? SpO2 96%       Intake/Output Summary (Last 24 hours) at 07/21/15 1209  Last data filed at 07/21/15 0933   Gross per 24 hour   Intake    240 ml   Output      1 ml   Net    239 ml       Physical Examination:    GEN: lethargic,easily arousable  RS: Chest is bilateral equal, no wheezing / rales / crackles  CVS: S1-S2 heard, RRR  Abdomen: Soft, Non tender, Not distended, Positive bowel sounds  Extremities: No edema, no cyanosis, skin is warm on touch   HEENT: Head is atraumatic, PERRLA, conjunctiva pink & non icteric. No JVD or carotid bruit      Data Review:      Labs:     Hematology:   Recent Labs      07/21/15   0323  07/20/15   1545   WBC  14.4*  15.7*   HGB  11.2*  10.4*   HCT  33.4*  31.0*     Chemistry:   Recent Labs      07/21/15   0323  07/20/15   2115  07/20/15   1545   BUN  68*  61*  146*   CREA  13.20*  11.60*  23.40*   CA  7.6*  8.8  6.6*   ALB   --    --   4.1   K  4.8  4.1  7.9*   NA  133*  135*  127*  CL  92*  90*  85*   CO2  25  28  13*   PHOS  8.1*   --    --    GLU  60*  45*  100*        Images:     XR (Most Recent). CXR reviewed by me and compared with previous CXR   Results from Hospital Encounter encounter on 07/20/15   XR CHEST PORT   Narrative EXAM: Chest Radiograph    INDICATION: Altered mental status. Patient on dialysis.    TECHNIQUE: AP view of the chest    COMPARISON: 07/13/2015, 01/04/2011, 01/02/2011     FINDINGS: No pneumothorax identified.  Bilateral interstitial infiltrates are  noted. Left greater than right. No effusions identified.  The cardiac silhouette  is enlarged. This is increased.   Cephalization of the pulmonary vessels are  noted. The osseous structures are unremarkable. A left axillary stent is noted.         Impression Impression:  1.  Mild to moderate interstitial pulmonary edema.            CT (Most Recent)   Results from Hospital Encounter encounter on 07/20/15   CT HEAD WO CONT   Narrative EXAM: CT of the Head without contrast    INDICATION: Unsteady gait and altered mental status.    TECHNIQUE: CT of the head from the vertex to the skull base performed. No IV  contrast administered.    All CT scans at this facility are performed using dose optimization technique as  appropriate to a performed exam, to include automated exposure control,  adjustment of the mA and/or kV according to patient size (including appropriate  matching for site specific examination) or use of iterative reconstruction  technique.     COMPARISON: None.    FINDINGS: No evidence of acute intra-axial or extra-axial hemorrhage.  The  ventricles and sulci are symmetric.  No midline shift, mass effect or mass  lesion appreciated.  The gray-white junction is preserved. No evidence of an  acute infarct identified. The mastoid air cells are well aerated. The paranasal  sinuses are unremarkable. The orbits are normal. The scalp and skull are  unremarkable.         Impression IMPRESSION:  1.   No acute intracranial process identified.             Plan / Recommendation:         End Stage Renal Disease:  Plan HD today    At 12 19PM on 07/21/2015, I saw and examined patient during hemodialysis treatment. The patient was receiving hemodialysis for treatment of end stage renal disease. I have also reviewed vital signs, intake and output, lab results and recent events, and agreed with today's dialysis order.    Access: No issue    Anemia:no epo   We made arrangements for outpatient dialysis last admission,but he didn,t show up .will make arrangements again as he is going to be in this area til next week.  Leukocytosis,improving.recheck in am  Start phoslo,check pth    Lyanne Co, MD  Nephrology  07/21/2015

## 2015-07-21 NOTE — Other (Signed)
IDR/SLIDR Summary          Patient: Robert Pierce MRN: [id number]    Age: 44 y.o.     Birthdate: 1970/08/10 Room/Bed: 452/01   Admit Diagnosis: Hyperkalemia  ESRD needing dialysis (HCC)  Altered mental status  Hyperkalemia  Altered mental state  Principal Diagnosis: Altered mental status     Goals: Get Better  Readmission: NO  Quality Measure: Not applicable  VTE Prophylaxis: Not needed  Influenza Vaccine screening completed? YES  Pneumococcal Vaccine screening completed? NO  Mobility needs: Yes   Nutrition plan:Yes  Consults:   Financial concerns:Yes  Escalated to CM? NO  RRAT Score: 13   Interventions:  Testing due for pt today? YES. Dialysis  LOS: 1 days Expected length of stay 4 days  Discharge plan: Pending   PCP: None  Transportation needs: Yes    Days before discharge:two or more days before discharge   Discharge disposition: Pending    Signed:     Lucas MallowJanice F Rasnake, RN  07/21/2015  11:05 AM

## 2015-07-21 NOTE — Consults (Addendum)
[facility]   [email or web address]      Impression:   1.alcoholism  2. Heroin abuse   3. Hep c   4. Marked transaminase elevation likely due to hep c alcohol and acetaminophen associated with the narcotics he has been using.  The drug level is meaningless 48hrs after ingestion.  Could also have bacteremia .    5. Renal stones with blood in the urine   6. Fever with leukocytosis at risk for endocarditis and other infections due to lifestyle      Plan:     1. No specific gi recs  2. Monitor liver enz daily and if the trend down no in house follow up needed   3. Hep C therapy is available for dialysis pts if he becomes compliant   4. Blood culture x 2       Chief Complaint: abd pain fevers       HPI:  Robert ClassChester Pierce is a 44 y.o. male who is being seen on consult for elevated liver enz in the setting of hep c liver disease and recent drug relapse.  He apparently has bought narcotics off the street that may have contained tylenol.  Level was low but this may be indicative of earlier ingestion .  Confusion has improved .  No abd pain or bleeding at this time .  Difficult historian .      PMH:   Past Medical History   Diagnosis Date   ??? Alcohol abuse    ??? Diastolic heart failure (HCC)    ??? Gastrointestinal bleeding    ??? Hepatitis C    ??? High blood pressure 01/13/2011   ??? History of echocardiogram 01/03/2011     EF 55-60%.  Gr 2 DDfx.  Increased RA pressure.  No R-L shunt.  Mild RAE.     ??? LE venous duplex 01/09/2011     No deep or superficial venous thrombosis in bilateral lower extremities.     ??? Migraine headache 01/13/2011       PSH:   No past surgical history on file.    Social HX:   Social History     Social History   ??? Marital status: SINGLE     Spouse name: N/A   ??? Number of children: N/A   ??? Years of education: N/A     Occupational History   ??? Not on file.     Social History Main Topics   ??? Smoking status: Current Every Day Smoker     Types: Cigarettes    ??? Smokeless tobacco: Never Used   ??? Alcohol use No   ??? Drug use: No   ??? Sexual activity: Yes     Partners: Female     Other Topics Concern   ??? Not on file     Social History Narrative   ??? No narrative on file       FHX:   No family history on file.    Allergy:   No Known Allergies    Home Medications:     Prescriptions Prior to Admission   Medication Sig   ??? hydrALAZINE (APRESOLINE) 25 mg tablet Take 1 Tab by mouth three (3) times daily.   ??? metoprolol tartrate (LOPRESSOR) 50 mg tablet Take 1 Tab by mouth two (2) times a day.   ??? amLODIPine (NORVASC) 10 mg tablet Take 1 Tab by mouth daily.   ??? albuterol (PROVENTIL HFA, VENTOLIN HFA, PROAIR HFA) 90 mcg/actuation  inhaler Take 1 Puff by inhalation as needed.   ??? pantoprazole (PROTONIX) 40 mg tablet Take 40 mg by mouth daily.     ??? citalopram (CELEXA) 20 mg tablet Take 1 Tab by mouth daily.   ??? thiamine (B-1) 100 mg tablet Take 1 Tab by mouth daily.   ??? multivitamin-iron, hematinic, (CEROVITE ADVANCED FORMULA) 27-0.4 mg Tab tablet Take 1 Tab by mouth daily.       Review of Systems:     Constitutional: No fevers, chills, weight loss, fatigue.   Skin: No rashes, pruritis, jaundice, ulcerations, erythema.   HENT: No headaches, nosebleeds, sinus pressure, rhinorrhea, sore throat.   Eyes: No visual changes, blurred vision, eye pain, photophobia, jaundice.   Cardiovascular: No chest pain, heart palpitations.   Respiratory: No cough, SOB, wheezing, chest discomfort, orthopnea.   Gastrointestinal: No jaundice or bleeding no abd pain now    Genitourinary: No dysuria, bleeding, discharge, pyuria.   Musculoskeletal: No weakness, arthralgias, wasting.   Endo: No sweats.   Heme: No bruising, easy bleeding.   Allergies: As noted.   Neurological: Cranial nerves intact.  Confused on adm and at times now  Gait not assessed.   Psychiatric:  No anxiety, depression, hallucinations.  Some confusion                  Visit Vitals   ??? BP (!) 169/119 (BP 1 Location: Right arm)   ??? Pulse 95    ??? Temp 98.3 ??F (36.8 ??C)   ??? Resp 18   ??? SpO2 96%       Physical Assessment:     constitutional: appearance: well developed, well nourished, normal habitus, no deformities, in no acute distress.   skin: inspection: no rashes, ulcers, icterus or other lesions; no clubbing or telangiectasias. palpation: no induration or subcutaneos nodules.   eyes: inspection: normal conjunctivae and lids; no jaundice pupils: symmetrical, normoreactive to light, normal accommodation and size.   ENMT: mouth: normal oral mucosa,lips and gums; good dentition. oropharynx: normal tongue, hard and soft palate; posterior pharynx without erythema, exudate or lesions.   neck: no masses organomegaly or tenderness.   respiratory: effort: normal chest excursion; no intercostal retraction or accessory muscle use.   cardiovascular: abdominal aorta: normal size and position; no bruits. palpation: PMI of normal size and position; normal rhythm; no thrill or murmurs.   abdominal: abdomen: normal consistency; no tenderness or masses. hernias: no hernias appreciated. liver: normal size and consistency. spleen: not palpable.   rectal: hemoccult/guaiac: not performed.   musculoskeletal: no deformities or muscle wasting   lymphatic: axilae: not palpable. groin: not palpable. neck: within normal limits. other: not palpable.   neurologic: cranial nerves: II-XII normal.   psychiatric: judgement/insight: within normal limits. memory: within normal limits for recent and remote events. mood and affect: no evidence of depression, anxiety or agitation. orientation: oriented to time, space and person.        Basic Metabolic Profile   Recent Labs      07/21/15   0323   NA  133*   K  4.8   CL  92*   CO2  25   BUN  68*   GLU  60*   CA  7.6*   MG  2.6*   PHOS  8.1*         CBC w/Diff    Recent Labs      07/21/15   0323   WBC  14.4*   RBC  3.99*  HGB  11.2*   HCT  33.4*   MCV  83.7   MCH  28.1   MCHC  33.5   RDW  14.8*   PLT  142    Recent Labs      07/21/15   0323    GRANS  84*   LYMPH  6*   EOS  0        Hepatic Function   Recent Labs      07/20/15   1545   ALB  4.1   TP  8.4*   TBILI  1.1*   SGOT  2227*   AP  60          J Maisie Fus, MD, M.D.   [facility] [place]  [phone]  [email or web address]

## 2015-07-21 NOTE — Other (Signed)
Please clarify   severity  of diastolic CHF noted in H&P :       chronic      other         QUESTIONS?     Jolene ProvostKathy Humphrey RN BSN  CCDS [phone]

## 2015-07-21 NOTE — Other (Signed)
Bedside and Verbal shift change report given to Sue LushAndrea, RN (oncoming nurse) by Lucas MallowJanice F Rasnake, RN (offgoing nurse). Report included the following information SBAR, Kardex, MAR and Recent Results.    SITUATION:   ? Code Status: Full Code  ? Reason for Admission: Hyperkalemia  ? ESRD needing dialysis (HCC)  ? Altered mental status  ? Hyperkalemia  ? Altered mental state    ? Hospital day: 1  ? Problem List:       Hospital Problems  Date Reviewed: 07/20/2015          Codes Class Noted POA    Transaminasemia ICD-10-CM: R74.0  ICD-9-CM: 790.4  07/21/2015 Unknown        Hyperkalemia ICD-10-CM: E87.5  ICD-9-CM: 276.7  07/20/2015 Unknown        * (Principal)Altered mental status ICD-10-CM: R41.82  ICD-9-CM: 780.97  07/20/2015 Unknown        ESRD needing dialysis [facility](HCC) ICD-10-CM: N18.6  ICD-9-CM: 585.6  07/20/2015 Unknown        Drug abuse ICD-10-CM: F19.10  ICD-9-CM: 305.90  07/20/2015 Unknown        Altered mental state ICD-10-CM: R41.82  ICD-9-CM: 780.97  07/20/2015 Unknown        High blood pressure ICD-10-CM: I10  ICD-9-CM: 401.9  01/13/2011 Yes              BACKGROUND:    Past Medical History:   Past Medical History   Diagnosis Date   ??? Alcohol abuse    ??? Diastolic heart failure (HCC)    ??? Gastrointestinal bleeding    ??? Hepatitis C    ??? High blood pressure 01/13/2011   ??? History of echocardiogram 01/03/2011     EF 55-60%.  Gr 2 DDfx.  Increased RA pressure.  No R-L shunt.  Mild RAE.     ??? LE venous duplex 01/09/2011     No deep or superficial venous thrombosis in bilateral lower extremities.     ??? Migraine headache 01/13/2011         Patient taking anticoagulants no     ASSESSMENT:   ? Changes in Assessment Throughout Shift: no    ? Patient has Central Line: no Reasons if yes: n/a  ? Patient has Foley Cath: no Reasons if yes: n/a     ? Last Vitals:     Vitals:    07/21/15 1932   BP: 119/80   Pulse: 73   Resp: 18   Temp: 99.3 ??F (37.4 ??C)   TempSrc:    SpO2: 94%       ? IV and DRAINS (will only show if present)         ? WOUND (if present)   Wound Type:  none   Dressing present  : none   Wound Concerns/Notes:  none    ? PAIN    Pain Assessment    Pain Intensity 1: 0 (07/21/15 1622)              Patient Stated Pain Goal: 0  o Interventions for Pain:  none  o Intervention effective: n/a  o Time of last intervention: none   o Reassessment Completed: n/a    ? Last 3 Weights:  There were no vitals filed for this visit.  Weight change:     ? INTAKE/OUPUT    Current Shift:      Last three shifts: 12/13 0701 - 12/14 1900  In: 840 [P.O.:840]  Out: 3001     ? LAB  RESULTS     Recent Labs      07/21/15   0323  07/20/15   1545   WBC  14.4*  15.7*   HGB  11.2*  10.4*   HCT  33.4*  31.0*   PLT  142  149        Recent Labs      07/21/15   0323  07/20/15   2115  07/20/15   1545   NA  133*  135*  127*   K  4.8  4.1  7.9*   GLU  60*  45*  100*   BUN  68*  61*  146*   CREA  13.20*  11.60*  23.40*   CA  7.6*  8.8  6.6*   MG  2.6*   --   3.3*   INR   --    --   1.5*       RECOMMENDATIONS AND DISCHARGE PLANNING     1. Pending tests/procedures/ Plan of Care or Other Needs: Labs in am     2. Discharge plan for patient and Needs/Barriers: Pening    3. Estimated Discharge Date: TBD Posted on Whiteboard in Patient???s Room: no      4. The patient's care plan was reviewed with the oncoming nurse.       "HEALS" SAFETY CHECK      Fall Risk    Total Score: 4    Safety Measures:      A safety check occurred in the patient's room between off going nurse and oncoming nurse listed above.    The safety check included the below items  Area Items   H  High Alert Medications ? Verify all high alert medication drips (heparin, PCA, etc.)   E  Equipment ? Suction is set up for ALL patients (with yanker)  ? Red plugs utilized for all equipment (IV pumps, etc.)  ? WOW???s wiped down at end of shift.  ? Room stocked with oxygen, suction, and other unit-specific supplies   A  Alarms ? Bed alarm is set for fall risk patients   ? Ensure chair alarm is in place and activated if patient is up in a chair   L  Lines ? Check IV for any infiltration  ? Foley bag is empty if patient has a Foley   ? Tubing and IV bags are labeled   S  Safety   ? Room is clean, patient is clean, and equipment is clean.  ? Hallways are clear from equipment besides carts.   ? Fall bracelet on for fall risk patients  ? Ensure room is clear and free of clutter  ? Suction is set up for ALL patients (with yanker)  ? Hallways are clear from equipment besides carts.   ? Isolation precautions followed, supplies available outside room, sign posted     Lucas Mallow, RN

## 2015-07-21 NOTE — Progress Notes (Signed)
Hospitalist Progress Note    Patient: Robert Pierce MRN: [id number]  CSN: [id number]    Date of Birth: 1970-10-21  Age: 44 y.o.  Sex: male    DOA: 07/20/2015 LOS:  LOS: 1 day          He is seen on HD and reports feeling better since admission and HD yesterday. Denies chest pain or shortness of breath. Denies constipation. States he is returning to [place] on Friday.     Assessment/Plan     1. Acute metabolic and toxic encephalopathy in the setting of uremia, electrolyte abnormalities, and heroin abuse. narcan given in ED with improvement. Head CT negative.   2. HyperKalemia with EKG changes resolved with medical management in ED and HD.   ??3. Transaminitis - hepatocellular/ alcohol abuse pattern, also he has hepatitis C  4. etoh abuse - mtf  ??5. Elevated Trop - likely demand ischemia from fluid overload  6. Hyponatremia in the setting of etOH abuse and ESRD.  ??7. ESRD needing HD - per Dr. Regan LemmingZaitoun  8. Hypertension on home meds.   9. COPD - no acute exacerbation. -cont albuterol   10. Heroin abuse - HIV negative 2012   11. Anemia of ckd - no epo per nephrology.   12. Hepatitis C  13. Medical noncompliance, documented  14. Full code.     Additional Notes:      Case discussed with:  Patient  Family  Nursing  Case Management  DVT Prophylaxis:  Lovenox  Hep SQ  SCDs  Coumadin   On Heparin gtt    Vital signs/Intake and Output:  Visit Vitals   ??? BP (!) 174/96 (BP 1 Location: Left arm)   ??? Pulse 84   ??? Temp 98.3 ??F (36.8 ??C)   ??? Resp 18   ??? SpO2 93%     Current Shift:     Last three shifts:       Awake alert and oriented. Seen on HD  Ncat. perrl  RRR  cta b.l anterior and infraaxillary fields  Soft nt  No edema.   Moving all 4s      Medications Reviewed      Labs: Results:       Chemistry Recent Labs      07/21/15   0323  07/20/15   2115  07/20/15   1545   GLU  60*  45*  100*   NA  133*  135*  127*   K  4.8  4.1  7.9*   CL  92*  90*  85*   CO2  25  28  13*   BUN  68*  61*  146*   CREA  13.20*  11.60*  23.40*    CA  7.6*  8.8  6.6*   AGAP  16  17  29*   BUCR  5*  5*  6*   AP   --    --   60   TP   --    --   8.4*   ALB   --    --   4.1   GLOB   --    --   4.3*   AGRAT   --    --   1.0      CBC w/Diff Recent Labs      07/21/15   0323  07/20/15   1545   WBC  14.4*  15.7*   RBC  3.99*  3.69*   HGB  11.2*  10.4*   HCT  33.4*  31.0*   PLT  142  149   GRANS  84*  85*   LYMPH  6*  11*   EOS  0  0      Cardiac Enzymes Recent Labs      07/21/15   0323  07/20/15   2115   CPK  549*  593*   CKND1  0.5  0.6      Coagulation Recent Labs      07/20/15   1545   PTP  17.9*   INR  1.5*   APTT  38.7*       Lipid Panel No results found for: CHOL, CHOLPOCT, CHOLX, CHLST, CHOLV, [id number], HDL, LDL, NLDLCT, DLDL, LDLC, DLDLP, 161096, VLDLC, VLDL, TGL, TGLX, TRIGL, [id number], TRIGP, TGLPOCT, 914782, CHHD, CHHDX   BNP No results for input(s): BNPP in the last 72 hours.   Liver Enzymes Recent Labs      07/20/15   1545   TP  8.4*   ALB  4.1   AP  60   SGOT  2227*      Thyroid Studies Lab Results   Component Value Date/Time    TSH 1.35 01/01/2011 02:47 PM        Procedures/imaging: see electronic medical records for all procedures/Xrays and details which were not copied into this note but were reviewed prior to creation of Plan

## 2015-07-21 NOTE — Other (Signed)
ACUTE HEMODIALYSIS FLOW SHEET    HEMODIALYSIS ORDERS: Physician: Zaitoun     Dialyzer: revaclear         Duration: 3.5  hr  BFR400 DFR: 800   Dialysate:  Temp 37.0 K+   2.0    Ca+  3.0 ca Bicarb 35   Weight: 72.8kg    Bed Scale      Unable to Obtain       Dry weight/UF Goal: 3000 Access LAVF  Needle Gauge15    Heparin   Bolus      Units     Hourly       Units    None     Catheter locking solution    Pre BP:  170/100    Pulse:  67     Temperature 97.7  Respirations:18  Tx: NS       ml/Bolus  Other         N/A   Labs: Pre        Post:         N/A   Additional Orders(medications, blood products, hypotension management):  none  N/A     Time Out/Safety Check   [facility] Consent Verified     CATHETER ACCESS N/A   Right   Left   IJ     Fem    First use X-ray verified     Tunnel                 Non Tunneled   No S/S infection  Redness  Drainage Cultured Swelling Pain   Medical Aseptic Prep Utilized   Dressing Changed   Biopatch  Date:       Clotted   Patent   Flows: Good  Poor  Reversed   If access problem, Dr. notified: Yes    N/A  Date:           GRAFT/FISTULA ACCESS:N/A     Right     Left     UE     LE   AVG   AVF        Buttonhole    Medical Aseptic Prep Utilized   No S/S infection  Redness  Drainage Cultured Swelling Pain    Bruit:    Strong     Weak       Thrill :    Strong     Weak       Needle Gauge: 15   Length: 1   If access problem, Dr. notified: Yes     N/A  Date:        Please describe access if present and not used:       GENERAL ASSESSMENT:   LUNGS:  Rate 18 SaO2%     N/A     Clear   Coarse   Crackles   Wheezing         Diminished     Location : RLL   LLL    RUL  LUL   Cough: Productive  Dry  N/A   Respirations:  Easy  Labored   Therapy:  RA  NC l/min    Mask: NRB Venti       O2%                  Ventilator  Intubated   Trach   BiPaP   CARDIAC: Regular       Irregular    Pericardial Rub   JVD  Monitored   Bedside   Remotely monitored  N/A  Rhythm:     EDEMA:  None  Generalized   Pitting  1     2     3     4                   Facial   Pedal    UE   LE   SKIN:    Warm   Hot      Cold    Dry      Pale    Diaphoretic                   Flushed   Jaundiced   Cyanotic   Rash   Weeping   LOC:     Alert      Oriented:     Person      Place  Time                Confused   Lethargic   Medicated   Non-responsive     GI / ABDOMEN   Flat     Distended     Soft     Firm     Obese                              Diarrhea   Bowel Sounds   Nausea   Vomiting      GU / URINE ASSESSMENT: Voiding    Oliguria   Anuria     Foley      Incontinent      Incontinent Brief        Bathroom Privileges     PAIN:  0 1  2   3   4   5   6   7   8   9   10             Scale 0-10  Action/Follow Up:    MOBILITY:   Amb     Amb/Assist     Bed     Wheelchair   Stretcher      All Vitals and Treatment Details on Attached Flowsheet       Hospital:[facility]       Room # 452      1st Time Acute   Stat   Routine   Urgent      Acute Room    Bedside   ICU/CCU   ER   Isolation Precautions:   Dialysis    Airborne    Contact     Reverse   Special Considerations:          Blood Consent Verified N/A     ALLERGIES:    NKA          Code Status:   Full Code   DNR   Other   Not on file     HBsAg ONLY: Date Drawn 07/20/15         Negative Positive Unknown   HBsAb: Date 07/20/15     Susceptible    Immune10 Not Drawn   Drawn     Current Labs:    Date of Labs:        Today         Results for Robert Pierce, Robert Pierce (MRN [id number]) as of 07/21/2015 12:45   Ref. Range 07/21/2015 03:23   WBC Latest Ref Range: 4.6 - 13.2 K/uL 14.4 (  H)   RBC Latest Ref Range: 4.70 - 5.50 M/uL 3.99 (L)   HGB Latest Ref Range: 13.0 - 16.0 g/dL 56.211.2 (L)   HCT Latest Ref Range: 36.0 - 48.0 % 33.4 (L)   MCV Latest Ref Range: 74.0 - 97.0 FL 83.7   MCH Latest Ref Range: 24.0 - 34.0 PG 28.1   MCHC Latest Ref Range: 31.0 - 37.0 g/dL 13.033.5   RDW Latest Ref Range: 11.6 - 14.5 % 14.8 (H)   PLATELET Latest Ref Range: 135 - 420 K/uL 142    MPV Latest Ref Range: 9.2 - 11.8 FL 11.1   NEUTROPHILS Latest Ref Range: 40 - 73 % 84 (H)   LYMPHOCYTES Latest Ref Range: 21 - 52 % 6 (L)   MONOCYTES Latest Ref Range: 3 - 10 % 10   EOSINOPHILS Latest Ref Range: 0 - 5 % 0   BASOPHILS Latest Ref Range: 0 - 2 % 0   DF Latest Units:   AUTOMATED   ABS. NEUTROPHILS Latest Ref Range: 1.8 - 8.0 K/UL 12.0 (H)   ABS. LYMPHOCYTES Latest Ref Range: 0.9 - 3.6 K/UL 0.9   ABS. MONOCYTES Latest Ref Range: 0.05 - 1.2 K/UL 1.5 (H)   ABS. EOSINOPHILS Latest Ref Range: 0.0 - 0.4 K/UL 0.0   ABS. BASOPHILS Latest Ref Range: 0.0 - 0.1 K/UL 0.0   Sodium Latest Ref Range: 136 - 145 mmol/L 133 (L)   Potassium Latest Ref Range: 3.5 - 5.5 mmol/L 4.8   Chloride Latest Ref Range: 100 - 108 mmol/L 92 (L)   CO2 Latest Ref Range: 21 - 32 mmol/L 25   Anion gap Latest Ref Range: 3.0 - 18 mmol/L 16   Glucose Latest Ref Range: 74 - 99 mg/dL 60 (L)   BUN Latest Ref Range: 7.0 - 18 MG/DL 68 (H)   Creatinine Latest Ref Range: 0.6 - 1.3 MG/DL 86.5713.20 (H)   BUN/Creatinine ratio Latest Ref Range: 12 - 20   5 (L)   Calcium Latest Ref Range: 8.5 - 10.1 MG/DL 7.6 (L)   Phosphorus Latest Ref Range: 2.5 - 4.9 MG/DL 8.1 (H)   Magnesium Latest Ref Range: 1.8 - 2.4 mg/dL 2.6 (H)   GFR est non-AA Latest Ref Range: >60 ml/min/1.3273m2 4 (L)   GFR est AA Latest Ref Range: >60 ml/min/1.5873m2 5 (L)   CK Latest Ref Range: 39 - 308 U/L 549 (H)   CK-MB Index Latest Ref Range: 0.0 - 4.0 % 0.5   CK - MB Latest Ref Range: 0.5 - 3.6 ng/ml 2.5   Troponin-I, Qt. Latest Ref Range: 0.0 - 0.045 NG/ML 0.13 (H)                                                                                                                                 DIET:   Renal     Other      NPO       Diabetic  PRIMARY NURSE REPORT: First initial/Last name/Title      Pre Dialysis: Avelino Leeds RN Time: 0930     EDUCATION:     Patient  Other         Knowledge Basis: None Minimal  Substantial   Barriers to learning  N/A     Access Care      S&S of infection      Fluid Management     K+     Procedural    Albumin      Medications      Tx Options      Transplant      Diet      Other   Teaching Tools:   Explain   Demo   Handouts  Video  Patient response:    Verbalized understanding   Teach back   Return demonstration  Requires follow up   Inappropriate due to            RO/HEMODIALYSIS MACHINE SAFETY CHECKS ??? Before each treatment:     Machine Number:                   [facility]                                   Unit Machine #5 with centralized RO                                   Portable Machine #1/RO serial # [id number]                                   Portable Machine #2/RO serial # [id number]                                   Portable Machine #3/RO serial # [id number]                                                                                                       [facility]                                   Portable Machine #11/RO serial # [id number]                                    Portable Machine #12/RO serial # [id number]                                   Portable Machine #13/RO serial #  [id number]      Alarm Test:  Pass timeb  0930        Other:          RO/Machine Log Complete      Temp     37.1          Extracorporeal Circuit Tested for integrity   Dialysate: pH  7.4 Conductivity: Meter   14.0     HD Machine   14.2                 TCD: 13.9  Dialyzer Lot # [id number]         Blood Tubing Lot # [id number]         Saline Lot # [id number]        CHLORINE TESTING-Before each treatment and every 4 hours    Total Chlorine less than 0.1 ppm  Time:0930  4 Hr/2nd Check Time   (if greater than 0.1 ppm from Primary then every 30 minutes from Secondary)     TREATMENT INITIATION ??? with Dialysis Precautions:    All Connections Secured                  Saline Line Double Clamped    Venous Parameters Set                   Arterial Parameters Set     Prime Given 200 ml               Air Foam Detector Engaged       Treatment Initiation Note: received pt in bed, pt awake and alert0x2. Pt has L upper arm AVF. +thrill/bruit, cannulated with 15 gauge needles with no difficulty. Hemodialysis now started will continue to monitor pt and vital signs     Medication Dose Volume Route Initials Dialyzer Cleared  Good  Fair   Poor    Blood processed: 65.9 L  UF Removed  3000 Ml    Post Wt: 70.9  kg  POst BP:  184/118        Pulse 90   Respirations: 18 Temperature:                                    Post Tx Vascular Access: AVF/AVG: Bleeding stopped Art 5 min. Ven5   Min   N/A                                   Catheter: Locking solution: Heparin 1:1000 Art.  Ven.   N/A                                 Post Assessment:                                    Skin:   Warm   Dry  Diaphoretic     Flushed   Pale  Cyanotic   [facility] Signatures Title Initials  Time Lungs  Clear     Course   Crackles   Wheezing  Diminished   Rosalee LOngshore RN RL [id number] Cardiac:  Regular    Irregular    Monitor   N/A  Rhythm:           Edema:   None  General      Facial    Pedal     UE     LE       Pain 0  1  2   3  4   5   6   7   8   9   10          Post Treatment Note: hemodialysis completed , 3 liters removed. Pt alert and awake. Pressure Dressing applied to L AVF arterial and venous site. Pt post bp high, primary nurse aware. Pt will return to room. Pt tolerated tx fair     POST TREATMENT PRIMARY NURSE HANDOFF REPORT:     First initial/Last name/Title         Post Dialysis:J Rasteak RN  Time: 1440     Abbreviations: AVG-arterial venous graft, AVF-arterial venous fistula, IJ-Internal Jugular, Subcl-Subclavian, Fem-Femoral, Tx-treatment, AP/HR-apical heart rate, DFR-dialysate flow rate, BFR-blood flow rate, AP-arterial pressure, VP-venous pressure, UF-ultrafiltrate, TMP-transmembrane pressure, Ven-Venous, Art-Arterial, RO-Reverse Osmosis

## 2015-07-22 LAB — METABOLIC PANEL, BASIC
Anion gap: 12 mmol/L (ref 3.0–18)
BUN/Creatinine ratio: 5 — ABNORMAL LOW (ref 12–20)
BUN: 51 MG/DL — ABNORMAL HIGH (ref 7.0–18)
CO2: 28 mmol/L (ref 21–32)
Calcium: 9.4 MG/DL (ref 8.5–10.1)
Chloride: 91 mmol/L — ABNORMAL LOW (ref 100–108)
Creatinine: 9.34 MG/DL — ABNORMAL HIGH (ref 0.6–1.3)
GFR est AA: 7 mL/min/{1.73_m2} — ABNORMAL LOW (ref >60)
GFR est non-AA: 6 mL/min/{1.73_m2} — ABNORMAL LOW (ref >60)
Glucose: 90 mg/dL (ref 74–99)
Potassium: 4.3 mmol/L (ref 3.5–5.5)
Sodium: 131 mmol/L — ABNORMAL LOW (ref 136–145)

## 2015-07-22 LAB — HEPATIC FUNCTION PANEL
A-G Ratio: 0.7 — ABNORMAL LOW (ref 0.8–1.7)
ALT (SGPT): 2235 U/L — ABNORMAL HIGH (ref 16–61)
AST (SGOT): 2364 U/L — ABNORMAL HIGH (ref 15–37)
Albumin: 3.7 g/dL (ref 3.4–5.0)
Alk. phosphatase: 78 U/L (ref 45–117)
Bilirubin, direct: 0.5 MG/DL — ABNORMAL HIGH (ref 0.0–0.2)
Bilirubin, total: 1.2 MG/DL — ABNORMAL HIGH (ref 0.2–1.0)
Globulin: 5.6 g/dL — ABNORMAL HIGH (ref 2.0–4.0)
Protein, total: 9.3 g/dL — ABNORMAL HIGH (ref 6.4–8.2)

## 2015-07-22 LAB — GLUCOSE, POC
Glucose (POC): 100 mg/dL (ref 70–110)
Glucose (POC): 98 mg/dL (ref 70–110)
Glucose (POC): 103 mg/dL (ref 70–110)
Glucose (POC): 100 mg/dL (ref 70–110)

## 2015-07-22 LAB — CBC WITH AUTOMATED DIFF
ABS. BASOPHILS: 0 10*3/uL (ref 0.0–0.06)
ABS. EOSINOPHILS: 0.1 10*3/uL (ref 0.0–0.4)
ABS. LYMPHOCYTES: 1.6 10*3/uL (ref 0.9–3.6)
ABS. MONOCYTES: 1.1 10*3/uL (ref 0.05–1.2)
ABS. NEUTROPHILS: 5.9 10*3/uL (ref 1.8–8.0)
BASOPHILS: 0 % (ref 0–2)
EOSINOPHILS: 1 % (ref 0–5)
HCT: 37.9 % (ref 36.0–48.0)
HGB: 12.6 g/dL — ABNORMAL LOW (ref 13.0–16.0)
LYMPHOCYTES: 18 % — ABNORMAL LOW (ref 21–52)
MCH: 28.3 PG (ref 24.0–34.0)
MCHC: 33.2 g/dL (ref 31.0–37.0)
MCV: 85.2 FL (ref 74.0–97.0)
MONOCYTES: 13 % — ABNORMAL HIGH (ref 3–10)
MPV: 11.7 FL (ref 9.2–11.8)
NEUTROPHILS: 68 % (ref 40–73)
PLATELET: 173 10*3/uL (ref 135–420)
RBC: 4.45 M/uL — ABNORMAL LOW (ref 4.70–5.50)
RDW: 14.6 % — ABNORMAL HIGH (ref 11.6–14.5)
WBC: 8.6 10*3/uL (ref 4.6–13.2)

## 2015-07-22 LAB — MAGNESIUM: Magnesium: 2.8 mg/dL — ABNORMAL HIGH (ref 1.8–2.4)

## 2015-07-22 LAB — PROTHROMBIN TIME + INR
INR: 1.2 (ref 0.8–1.2)
Prothrombin time: 14.9 s (ref 11.5–15.2)

## 2015-07-22 LAB — PTH INTACT
Calcium: 9.7 MG/DL (ref 8.5–10.1)
PTH, Intact: 420.8 pg/mL — ABNORMAL HIGH (ref 14.0–72.0)

## 2015-07-22 LAB — PHOSPHORUS: Phosphorus: 4.2 MG/DL (ref 2.5–4.9)

## 2015-07-22 MED ORDER — DOXERCALCIFEROL 4 MCG/2 ML IV SOLN
4 mcg/2 mL | INTRAVENOUS | Status: DC
Start: 2015-07-22 — End: 2015-07-24
  Administered 2015-07-23: 19:00:00 via INTRAVENOUS

## 2015-07-22 MED ORDER — FOLIC ACID 1 MG TAB
1 mg | Freq: Every day | ORAL | Status: DC
Start: 2015-07-22 — End: 2015-07-24
  Administered 2015-07-22 – 2015-07-24 (×3): via ORAL

## 2015-07-22 MED ORDER — HEPARIN (PORCINE) 5,000 UNIT/ML IJ SOLN
5000 unit/mL | Freq: Three times a day (TID) | INTRAMUSCULAR | Status: DC
Start: 2015-07-22 — End: 2015-07-24

## 2015-07-22 MED FILL — HYDRALAZINE 25 MG TAB: 25 mg | ORAL | Qty: 1

## 2015-07-22 MED FILL — CALCIUM ACETATE 667 MG CAP: 667 mg | ORAL | Qty: 4

## 2015-07-22 MED FILL — THERA 400 MCG TABLET: 400 mcg | ORAL | Qty: 1

## 2015-07-22 MED FILL — PANTOPRAZOLE 40 MG TAB, DELAYED RELEASE: 40 mg | ORAL | Qty: 1

## 2015-07-22 MED FILL — METOPROLOL TARTRATE 50 MG TAB: 50 mg | ORAL | Qty: 1

## 2015-07-22 MED FILL — VITAMIN B-1 100 MG TABLET: 100 mg | ORAL | Qty: 1

## 2015-07-22 MED FILL — AMLODIPINE 10 MG TAB: 10 mg | ORAL | Qty: 1

## 2015-07-22 MED FILL — HEPARIN (PORCINE) 5,000 UNIT/ML IJ SOLN: 5000 unit/mL | INTRAMUSCULAR | Qty: 1

## 2015-07-22 MED FILL — FOLIC ACID 1 MG TAB: 1 mg | ORAL | Qty: 1

## 2015-07-22 MED FILL — CITALOPRAM 20 MG TAB: 20 mg | ORAL | Qty: 1

## 2015-07-22 NOTE — Progress Notes (Signed)
PT order received and chart was reviewed. Pt currently has a complete bedrest order.  PT evaluation can not be completed while patient is on bedrest.   Will continue to follow. Wolfgang PhoenixLori Surber, PT

## 2015-07-22 NOTE — Progress Notes (Addendum)
RENAL DAILY PROGRESS NOTE    Patient: Robert ClassChester Pierce               Sex: male          DOA: 07/20/2015  1:51 PM        Date of Birth:  02-May-1971      Age:  44 y.o.        LOS:  LOS: 2 days     Subjective:     Robert Pierce is a 44 y.o. African American who presents with Hyperkalemia  ESRD needing dialysis (HCC)  Altered mental status  Hyperkalemia  Altered mental state.   Asked to evaluate for esrd,severe hyperkalemia,had missed several dialysis treatments The patient has a history of of drug abuse  Chief complains: Patient denies nausea, vomiting, chest pain, dizziness, shortness of breath or headache.  - Reviewed last 24 hrs events     Current Facility-Administered Medications   Medication Dose Route Frequency   ??? heparin (porcine) injection 5,000 Units  5,000 Units SubCUTAneous Q8H   ??? amLODIPine (NORVASC) tablet 10 mg  10 mg Oral DAILY   ??? citalopram (CELEXA) tablet 20 mg  20 mg Oral DAILY   ??? hydrALAZINE (APRESOLINE) tablet 25 mg  25 mg Oral TID   ??? metoprolol tartrate (LOPRESSOR) tablet 50 mg  50 mg Oral BID   ??? therapeutic multivitamin (THERAGRAN) tablet 1 Tab  1 Tab Oral DAILY   ??? pantoprazole (PROTONIX) tablet 40 mg  40 mg Oral DAILY   ??? thiamine (B-1) tablet 100 mg  100 mg Oral DAILY   ??? albuterol (PROVENTIL VENTOLIN) nebulizer solution 2.5 mg  2.5 mg Nebulization Q4H PRN   ??? calcium acetate (PHOSLO) capsule 2,668 mg  4 Cap Oral TID WITH MEALS   ??? folic acid (FOLVITE) tablet 1 mg  1 mg Oral DAILY   ??? naloxone (NARCAN) injection 0.4 mg  0.4 mg IntraVENous EVERY 2 MINUTES AS NEEDED       Objective:     Visit Vitals   ??? BP 127/86 (BP 1 Location: Right arm, BP Patient Position: At rest)   ??? Pulse 65   ??? Temp 97.2 ??F (36.2 ??C)   ??? Resp 18   ??? SpO2 98%       Intake/Output Summary (Last 24 hours) at 07/22/15 1344  Last data filed at 07/21/15 1841   Gross per 24 hour   Intake    600 ml   Output   3000 ml   Net  -2400 ml       Physical Examination:     GEN: AAO X 3, NAD   RS: Chest is bilateral equal, no wheezing / rales / crackles  CVS: S1-S2 heard, RRR, No S3 / murmur  Abdomen: Soft, Non tender, Not distended, Positive bowel sounds, no organomegaly, no CVA / supra pubic tenderness  Extremities: No edema, no cyanosis, skin is warm on touch  CNS: Awake & follows commands, CN II-XII are grossly intact.  HEENT: Head is atraumatic, PERRLA, conjunctiva pink & non icteric. No JVD or carotid bruit   Psychiatric: Normal mood, affect, judgement & memory    Musculoskeletal: No gross joints or bone deformities   Lymph Node: No palpable cervical, axillary or groin lymphadenopathy.      Data Review:      Labs:     Hematology: Recent Labs      07/22/15   0316  07/21/15   0323  07/20/15   1545   WBC  8.6  14.4*  15.7*   HGB  12.6*  11.2*  10.4*   HCT  37.9  33.4*  31.0*     Chemistry: Recent Labs      07/22/15   0316  07/21/15   1830  07/21/15   0323  07/20/15   2115  07/20/15   1545   BUN  51*   --   68*  61*  146*   CREA  9.34*   --   13.20*  11.60*  23.40*   CA  9.4  9.7  7.6*  8.8  6.6*   ALB  3.7   --    --    --   4.1   K  4.3   --   4.8  4.1  7.9*   NA  131*   --   133*  135*  127*   CL  91*   --   92*  90*  85*   CO2  28   --   25  28  13*   PHOS  4.2   --   8.1*   --    --    GLU  90   --   60*  45*  100*        Images:    XR (Most Recent). CXR reviewed by me and compared with previous CXR   Results from Hospital Encounter encounter on 07/20/15   XR CHEST PORT   Narrative EXAM: Chest Radiograph    INDICATION: Altered mental status. Patient on dialysis.    TECHNIQUE: AP view of the chest    COMPARISON: 07/13/2015, 01/04/2011, 01/02/2011     FINDINGS: No pneumothorax identified.  Bilateral interstitial infiltrates are  noted. Left greater than right. No effusions identified.  The cardiac silhouette  is enlarged. This is increased.   Cephalization of the pulmonary vessels are  noted. The osseous structures are unremarkable. A left axillary stent is noted.         Impression Impression:   1.  Mild to moderate interstitial pulmonary edema.            CT (Most Recent)   Results from Hospital Encounter encounter on 07/20/15   CT HEAD WO CONT   Narrative EXAM: CT of the Head without contrast    INDICATION: Unsteady gait and altered mental status.    TECHNIQUE: CT of the head from the vertex to the skull base performed. No IV  contrast administered.    All CT scans at this facility are performed using dose optimization technique as  appropriate to a performed exam, to include automated exposure control,  adjustment of the mA and/or kV according to patient size (including appropriate  matching for site specific examination) or use of iterative reconstruction  technique.    COMPARISON: None.    FINDINGS: No evidence of acute intra-axial or extra-axial hemorrhage.  The  ventricles and sulci are symmetric.  No midline shift, mass effect or mass  lesion appreciated.  The gray-white junction is preserved. No evidence of an  acute infarct identified. The mastoid air cells are well aerated. The paranasal  sinuses are unremarkable. The orbits are normal. The scalp and skull are  unremarkable.         Impression IMPRESSION:  1.   No acute intracranial process identified.           EKG Results for orders placed or performed in visit on 02/21/11   AMB POC EKG ROUTINE  W/ 12 LEADS, INTER & REP     Status: None    Impression    See progress note.         I have personally reviewed the old medical records and patient's labs    Plan / Recommendation:      1. [place]he stated he is going to stay in this area til after his mother,s funeral.I requested dialysis at [facility] on mon wed Friday.needs to dialyze in the hospital tomorrow.  2.leukocytosis ,resolved  3.elevated liver enzymes,pos hepatitis c,seen by dr Hart [place]    D/w Dr.schley    Lyanne Co, MD  Nephrology  07/22/2015

## 2015-07-22 NOTE — Other (Incomplete)
Bedside and Verbal shift change report given to Okey Regalarol, RN (oncoming nurse) by Lucas MallowJanice F Rasnake, RN (offgoing nurse). Report included the following information SBAR, Kardex, MAR and Recent Results.    SITUATION:   ? Code Status: Full Code  ? Reason for Admission: Hyperkalemia  ? ESRD needing dialysis (HCC)  ? Altered mental status  ? Hyperkalemia  ? Altered mental state    ? Hospital day: 2  ? Problem List:       Hospital Problems  Date Reviewed: 07/20/2015          Codes Class Noted POA    Transaminasemia ICD-10-CM: R74.0  ICD-9-CM: 790.4  07/21/2015 Unknown        Hyperkalemia ICD-10-CM: E87.5  ICD-9-CM: 276.7  07/20/2015 Unknown        * (Principal)Altered mental status ICD-10-CM: R41.82  ICD-9-CM: 780.97  07/20/2015 Unknown        ESRD needing dialysis [facility](HCC) ICD-10-CM: N18.6  ICD-9-CM: 585.6  07/20/2015 Unknown        Drug abuse ICD-10-CM: F19.10  ICD-9-CM: 305.90  07/20/2015 Unknown        Altered mental state ICD-10-CM: R41.82  ICD-9-CM: 780.97  07/20/2015 Unknown        High blood pressure ICD-10-CM: I10  ICD-9-CM: 401.9  01/13/2011 Yes              BACKGROUND:    Past Medical History:   Past Medical History   Diagnosis Date   ??? Alcohol abuse    ??? Diastolic heart failure (HCC)    ??? Gastrointestinal bleeding    ??? Hepatitis C    ??? High blood pressure 01/13/2011   ??? History of echocardiogram 01/03/2011     EF 55-60%.  Gr 2 DDfx.  Increased RA pressure.  No R-L shunt.  Mild RAE.     ??? LE venous duplex 01/09/2011     No deep or superficial venous thrombosis in bilateral lower extremities.     ??? Migraine headache 01/13/2011         Patient taking anticoagulants {yes/ no default no:19426::"no"}     ASSESSMENT:   ? Changes in Assessment Throughout Shift: ***    ? Patient has Central Line: {yes/ no default no:19426::"no"} Reasons if yes: ***  ? Patient has Foley Cath: {yes/ no default no:19426::"no"} Reasons if yes: ***     ? Last Vitals:     Vitals:    07/22/15 1520   BP: 120/85   Pulse: 68   Resp: 18    Temp: 97.1 ??F (36.2 ??C)   TempSrc:    SpO2: 98%       ? IV and DRAINS (will only show if present)   Peripheral IV 07/20/15 Right Arm-Site Assessment: Clean, dry, & intact  Peripheral IV 07/20/15 Right Arm-Site Assessment: Clean, dry, & intact    ? WOUND (if present)   Wound Type:  {None:33079::"none"}   Dressing present Dressing Present : No   Wound Concerns/Notes:  {None:33079::"none"}    ? PAIN    Pain Assessment    Pain Intensity 1: 0 (07/22/15 1235)              Patient Stated Pain Goal: 0  o Interventions for Pain:  {None:33079::"none"}  o Intervention effective: {yes/ no default no:19426::"no"}  o Time of last intervention: ***   o Reassessment Completed: {yes/ no default no:19426::"no"}     ? Last 3 Weights:  There were no vitals filed for this visit.  Weight change:     ?  INTAKE/OUPUT    Current Shift:      Last three shifts: 12/14 0701 - 12/15 1900  In: 840 [P.O.:840]  Out: 3001     ? LAB RESULTS     Recent Labs      07/22/15   0316  07/21/15   0323  07/20/15   1545   WBC  8.6  14.4*  15.7*   HGB  12.6*  11.2*  10.4*   HCT  37.9  33.4*  31.0*   PLT  173  142  149        Recent Labs      07/22/15   1337  07/22/15   0316  07/21/15   1830  07/21/15   0323  07/20/15   2115  07/20/15   1545   NA   --   131*   --   133*  135*  127*   K   --   4.3   --   4.8  4.1  7.9*   GLU   --   90   --   60*  45*  100*   BUN   --   51*   --   68*  61*  146*   CREA   --   9.34*   --   13.20*  11.60*  23.40*   CA   --   9.4  9.7  7.6*  8.8  6.6*   MG   --   2.8*   --   2.6*   --   3.3*   INR  1.2   --    --    --    --   1.5*       RECOMMENDATIONS AND DISCHARGE PLANNING     1. Pending tests/procedures/ Plan of Care or Other Needs: ***     2. Discharge plan for patient and Needs/Barriers: ***    3. Estimated Discharge Date: *** Posted on Whiteboard in Patient???s Room: {yes/ no default no:19426::"no"}      4. The patient's care plan was reviewed with the oncoming nurse.       "HEALS" SAFETY CHECK      Fall Risk    Total Score: 4     Safety Measures: Safety Measures: Bed/Chair-Wheels locked, Bed in low position, Call light within reach    A safety check occurred in the patient's room between off going nurse and oncoming nurse listed above.    The safety check included the below items  Area Items   H  High Alert Medications ? Verify all high alert medication drips (heparin, PCA, etc.)   E  Equipment ? Suction is set up for ALL patients (with yanker)  ? Red plugs utilized for all equipment (IV pumps, etc.)  ? WOW???s wiped down at end of shift.  ? Room stocked with oxygen, suction, and other unit-specific supplies   A  Alarms ? Bed alarm is set for fall risk patients  ? Ensure chair alarm is in place and activated if patient is up in a chair   L  Lines ? Check IV for any infiltration  ? Foley bag is empty if patient has a Foley   ? Tubing and IV bags are labeled   S  Safety   ? Room is clean, patient is clean, and equipment is clean.  ? Hallways are clear from equipment besides carts.   ? Fall bracelet on for fall risk patients  ?  Ensure room is clear and free of clutter  ? Suction is set up for ALL patients (with yanker)  ? Hallways are clear from equipment besides carts.   ? Isolation precautions followed, supplies available outside room, sign posted     Irish Elders, RN
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
## 2015-07-22 NOTE — Other (Signed)
Bedside and Verbal shift change report given to Liborio NixonJanice, Charity fundraiserN (oncoming nurse) by Veneda MelterAndrea L Townsend, RN (offgoing nurse). Report included the following information SBAR, Kardex, MAR and Recent Results.    SITUATION:   ? Code Status: Full Code  Reason for Admission: Hyperkalemia  ESRD needing dialysis (HCC)  Altered mental status  Hyperkalemia  ? Altered mental state    ? Hospital day: 2  ? Problem List:       Hospital Problems  Date Reviewed: 07/20/2015          Codes Class Noted POA    Transaminasemia ICD-10-CM: R74.0  ICD-9-CM: 790.4  07/21/2015 Unknown        Hyperkalemia ICD-10-CM: E87.5  ICD-9-CM: 276.7  07/20/2015 Unknown        * (Principal)Altered mental status ICD-10-CM: R41.82  ICD-9-CM: 780.97  07/20/2015 Unknown        ESRD needing dialysis [facility](HCC) ICD-10-CM: N18.6  ICD-9-CM: 585.6  07/20/2015 Unknown        Drug abuse ICD-10-CM: F19.10  ICD-9-CM: 305.90  07/20/2015 Unknown        Altered mental state ICD-10-CM: R41.82  ICD-9-CM: 780.97  07/20/2015 Unknown        High blood pressure ICD-10-CM: I10  ICD-9-CM: 401.9  01/13/2011 Yes              BACKGROUND:    Past Medical History:   Past Medical History   Diagnosis Date   ??? Alcohol abuse    ??? Diastolic heart failure (HCC)    ??? Gastrointestinal bleeding    ??? Hepatitis C    ??? High blood pressure 01/13/2011   ??? History of echocardiogram 01/03/2011     EF 55-60%.  Gr 2 DDfx.  Increased RA pressure.  No R-L shunt.  Mild RAE.     ??? LE venous duplex 01/09/2011     No deep or superficial venous thrombosis in bilateral lower extremities.     ??? Migraine headache 01/13/2011         Patient taking anticoagulants no     ASSESSMENT:   ? Changes in Assessment Throughout Shift: no    ? Patient has Central Line: no Reasons if yes: n/a  ? Patient has Foley Cath: no Reasons if yes: n/a     ? Last Vitals:     Vitals:    07/22/15 0311   BP: 122/80   Pulse: 63   Resp: 18   Temp: 97.1 ??F (36.2 ??C)   TempSrc:    SpO2: 96%       ? IV and DRAINS (will only show if present)         ? WOUND (if present)   Wound Type:  none   Dressing present Dressing Present : No: none   Wound Concerns/Notes:  none    ? PAIN    Pain Assessment    Pain Intensity 1: 0 (07/22/15 0741)              Patient Stated Pain Goal: 0  o Interventions for Pain:  none  o Intervention effective: n/a  o Time of last intervention: none   o Reassessment Completed: n/a    ? Last 3 Weights:  There were no vitals filed for this visit.  Weight change:     ? INTAKE/OUPUT    Current Shift:      Last three shifts: 12/13 1901 - 12/15 0700  In: 840 [P.O.:840]  Out: 3001     ? LAB RESULTS  Recent Labs      07/22/15   0316  07/21/15   0323  07/20/15   1545   WBC  8.6  14.4*  15.7*   HGB  12.6*  11.2*  10.4*   HCT  37.9  33.4*  31.0*   PLT  173  142  149        Recent Labs      07/22/15   0316  07/21/15   1830  07/21/15   0323  07/20/15   2115  07/20/15   1545   NA  131*   --   133*  135*  127*   K  4.3   --   4.8  4.1  7.9*   GLU  90   --   60*  45*  100*   BUN  51*   --   68*  61*  146*   CREA  9.34*   --   13.20*  11.60*  23.40*   CA  9.4  9.7  7.6*  8.8  6.6*   MG  2.8*   --   2.6*   --   3.3*   INR   --    --    --    --   1.5*       RECOMMENDATIONS AND DISCHARGE PLANNING     1. Pending tests/procedures/ Plan of Care or Other Needs: Labs in am     2. Discharge plan for patient and Needs/Barriers: Pening    3. Estimated Discharge Date: TBD Posted on Whiteboard in Patient???s Room: no      4. The patient's care plan was reviewed with the oncoming nurse.       "HEALS" SAFETY CHECK      Fall Risk    Total Score: 4    Safety Measures: Safety Measures: Bed/Chair alarm on, Bed/Chair-Wheels locked, Bed in low position, Call light within reach, Side rails X 3    A safety check occurred in the patient's room between off going nurse and oncoming nurse listed above.    The safety check included the below items  Area Items   H  High Alert Medications ? Verify all high alert medication drips (heparin, PCA, etc.)   E   Equipment ? Suction is set up for ALL patients (with yanker)  ? Red plugs utilized for all equipment (IV pumps, etc.)  ? WOW???s wiped down at end of shift.  ? Room stocked with oxygen, suction, and other unit-specific supplies   A  Alarms ? Bed alarm is set for fall risk patients  ? Ensure chair alarm is in place and activated if patient is up in a chair   L  Lines ? Check IV for any infiltration  ? Foley bag is empty if patient has a Foley   ? Tubing and IV bags are labeled   S  Safety   ? Room is clean, patient is clean, and equipment is clean.  ? Hallways are clear from equipment besides carts.   ? Fall bracelet on for fall risk patients  ? Ensure room is clear and free of clutter  ? Suction is set up for ALL patients (with yanker)  ? Hallways are clear from equipment besides carts.   ? Isolation precautions followed, supplies available outside room, sign posted     Veneda Melter, RN

## 2015-07-22 NOTE — Progress Notes (Signed)
[facility]  [address]  [place], [place] [address]    Progress Note    Patient: Robert Pierce MRN: [id number]  SSN: [id number]    Date of Birth: [birth year]  Age: 44 y.o.  Sex: male      Admit Date: 07/20/2015    LOS: 2 days     Chief complaint:my liver     Impression:     1. Acute on chronic hepatitis, not clear what is causing the acute elevation   2. Hx of drug abuse     Plan:     1.  Check HIV  2. Monitor liver enz daily  3. Check coags    Subjective:     The patient problems have remained stable with no abd pain nausea or vomting .  No jaundice noted and eating well.      Objective:     Vitals:    07/21/15 2315 07/22/15 0311 07/22/15 0823 07/22/15 1115   BP: 128/87 122/80 (!) 137/91 127/86   Pulse: 64 63 75 65   Resp: Temp: 97.6 ??F (36.4 ??C) 97.1 ??F (36.2 ??C) 97.2 ??F (36.2 ??C) 97.2 ??F (36.2 ??C)   TempSrc:       SpO2: 95% 96% 97% 98%        Physical Exam:   GENERAL: alert, cooperative, no distress, appears stated age  ABDOMEN: soft, non-tender. Bowel sounds normal. No masses,  no organomegaly    Lab/Data Review:  BMP:   Lab Results   Component Value Date/Time    NA 131 (L) 07/22/2015 03:16 AM    K 4.3 07/22/2015 03:16 AM    CL 91 (L) 07/22/2015 03:16 AM    CO2 28 07/22/2015 03:16 AM    AGAP 12 07/22/2015 03:16 AM    GLU 90 07/22/2015 03:16 AM    BUN 51 (H) 07/22/2015 03:16 AM    CREA 9.34 (H) 07/22/2015 03:16 AM    GFRAA 7 (L) 07/22/2015 03:16 AM    GFRNA 6 (L) 07/22/2015 03:16 AM     CMP:   Lab Results   Component Value Date/Time    NA 131 (L) 07/22/2015 03:16 AM    K 4.3 07/22/2015 03:16 AM    CL 91 (L) 07/22/2015 03:16 AM    CO2 28 07/22/2015 03:16 AM    AGAP 12 07/22/2015 03:16 AM    GLU 90 07/22/2015 03:16 AM    BUN 51 (H) 07/22/2015 03:16 AM    CREA 9.34 (H) 07/22/2015 03:16 AM    GFRAA 7 (L) 07/22/2015 03:16 AM    GFRNA 6 (L) 07/22/2015 03:16 AM    CA 9.4 07/22/2015 03:16 AM    MG 2.8 (H) 07/22/2015 03:16 AM    PHOS 4.2 07/22/2015 03:16 AM     ALB 3.7 07/22/2015 03:16 AM    TP 9.3 (H) 07/22/2015 03:16 AM    GLOB 5.6 (H) 07/22/2015 03:16 AM    AGRAT 0.7 (L) 07/22/2015 03:16 AM    SGOT 2364 (H) 07/22/2015 03:16 AM    ALT 2235 (H) 07/22/2015 03:16 AM     CBC:   Lab Results   Component Value Date/Time    WBC 8.6 07/22/2015 03:16 AM    HGB 12.6 (L) 07/22/2015 03:16 AM    HCT 37.9 07/22/2015 03:16 AM    PLT 173 07/22/2015 03:16 AM     COAGS: No results found for: APTT, PTP, INR  Liver Panel:   Lab  Results   Component Value Date/Time    ALB 3.7 07/22/2015 03:16 AM    CBIL 0.5 (H) 07/22/2015 03:16 AM    TP 9.3 (H) 07/22/2015 03:16 AM    GLOB 5.6 (H) 07/22/2015 03:16 AM    AGRAT 0.7 (L) 07/22/2015 03:16 AM    SGOT 2364 (H) 07/22/2015 03:16 AM    ALT 2235 (H) 07/22/2015 03:16 AM    AP 78 07/22/2015 03:16 AM          Principal Problem:    Altered mental status (07/20/2015)    Active Problems:    High blood pressure (01/13/2011)      Hyperkalemia (07/20/2015)      ESRD needing dialysis (HCC) (07/20/2015)      Drug abuse (07/20/2015)      Altered mental state (07/20/2015)      Transaminasemia (07/21/2015)          Signed By: Ihor AustinJ Mark Shaan Rhoads, MD     July 22, 2015

## 2015-07-22 NOTE — Other (Signed)
IDR/SLIDR Summary          Patient: Robert Pierce MRN: [id number]    Age: 44 y.o.     Birthdate: Jul 17, 1971 Room/Bed: 452/01   Admit Diagnosis: Hyperkalemia  ESRD needing dialysis (HCC)  Altered mental status  Hyperkalemia  Altered mental state  Principal Diagnosis: Altered mental status     Goals: Get Better  Readmission: NO  Quality Measure:  VTE Prophylaxis: Not needed  Influenza Vaccine screening completed? YES  Pneumococcal Vaccine screening completed? NO  Mobility needs: Yes   Nutrition plan:Yes  Consults PT/OT  Financial concerns:Yes  Escalated to CM? NO  RRAT Score: 13   Interventions:  Testing due for pt today? YES. Dialysis  LOS: 2 days Expected length of stay 4 days  Discharge plan: Pending   PCP: None  Transportation needs: Yes    Days before discharge:two or more days before discharge   Discharge disposition: Pending    Signed:     Lucas MallowJanice F Rasnake, RN  07/22/2015  11:05 AM

## 2015-07-22 NOTE — Progress Notes (Signed)
Hospitalist Progress Note    Patient: Robert Pierce MRN: [id number]  CSN: [id number]    Date of Birth: Jul 05, 1971  Age: 44 y.o.  Sex: male    DOA: 07/20/2015 LOS:  LOS: 2 days          States his Mom died earlier this month and body was recently released from hospital. Family is planning funeral and patient to stay in town for this.     Assessment/Plan     1. Acute metabolic and toxic encephalopathy in the setting of uremia, electrolyte abnormalities, and heroin abuse. narcan given in ED with improvement. Head CT negative.   2. HyperKalemia with EKG changes resolved with medical management in ED and HD.   ??3. Elevated lfts continue to trend up . Follow HIV and GI recs.   4. etoh abuse - mtf  ??5. Elevated Trop - likely demand ischemia from fluid overload  6. Hyponatremia in the setting of etOH abuse and ESRD.  ??7. ESRD needing HD - per Dr. Regan Lemming  8. Hypertension on home meds.   9. COPD - no acute exacerbation. -cont albuterol   10. Heroin abuse - HIV negative 2012, follow repeat.   11. Anemia of ckd - no epo per nephrology.   12. Hepatitis C  13. Medical noncompliance, documented  14. Full code. I discussed the case with Dr. Regan Lemming.    Additional Notes:      Case discussed with:  Patient  Family  Nursing  Case Management  DVT Prophylaxis:  Lovenox  Hep SQ  SCDs  Coumadin   On Heparin gtt    Vital signs/Intake and Output:  Visit Vitals   ??? BP 127/86 (BP 1 Location: Right arm, BP Patient Position: At rest)   ??? Pulse 65   ??? Temp 97.2 ??F (36.2 ??C)   ??? Resp 18   ??? SpO2 98%     Current Shift:     Last three shifts:  12/13 1901 - 12/15 0700  In: 840 [P.O.:840]  Out: 3001     Awake alert and oriented. daughter at bedside  Ncat. perrl  RRR  cta b.l   Soft nt  No edema. HD access to LUE +palpable thrill. Radial pulses 2+ b.l  Moving all 4s  No rash    Medications Reviewed      Labs: Results:       Chemistry Recent Labs      07/22/15   0316  07/21/15   1830  07/21/15   0323  07/20/15   2115  07/20/15   1545    GLU  90   --   60*  45*  100*   NA  131*   --   133*  135*  127*   K  4.3   --   4.8  4.1  7.9*   CL  91*   --   92*  90*  85*   CO2  28   --   25  28  13*   BUN  51*   --   68*  61*  146*   CREA  9.34*   --   13.20*  11.60*  23.40*   CA  9.4  9.7  7.6*  8.8  6.6*   AGAP  12   --   16  17  29*   BUCR  5*   --   5*  5*  6*   AP  78   --    --    --  60   TP  9.3*   --    --    --   8.4*   ALB  3.7   --    --    --   4.1   GLOB  5.6*   --    --    --   4.3*   AGRAT  0.7*   --    --    --   1.0      CBC w/Diff Recent Labs      07/22/15   0316  07/21/15   0323  07/20/15   1545   WBC  8.6  14.4*  15.7*   RBC  4.45*  3.99*  3.69*   HGB  12.6*  11.2*  10.4*   HCT  37.9  33.4*  31.0*   PLT  173  142  149   GRANS  68  84*  85*   LYMPH  18*  6*  11*   EOS  1  0  0      Cardiac Enzymes Recent Labs      07/21/15   0323  07/20/15   2115   CPK  549*  593*   CKND1  0.5  0.6      Coagulation Recent Labs      07/20/15   1545   PTP  17.9*   INR  1.5*   APTT  38.7*       Lipid Panel No results found for: CHOL, CHOLPOCT, CHOLX, CHLST, CHOLV, [id number], HDL, LDL, NLDLCT, DLDL, LDLC, DLDLP, [id number], VLDLC, VLDL, TGL, TGLX, TRIGL, [id number], TRIGP, TGLPOCT, [id number], CHHD, CHHDX   BNP No results for input(s): BNPP in the last 72 hours.   Liver Enzymes Recent Labs      07/22/15   0316   TP  9.3*   ALB  3.7   AP  78   SGOT  2364*      Thyroid Studies Lab Results   Component Value Date/Time    TSH 1.35 01/01/2011 02:47 PM        Procedures/imaging: see electronic medical records for all procedures/Xrays and details which were not copied into this note but were reviewed prior to creation of Plan

## 2015-07-22 NOTE — Progress Notes (Addendum)
Care Management Interventions  PCP Verified by CM: Yes (pt provided [employer], [employer] Provider Listing to chose from for his assisgnement)  Mode of Transport at Discharge: Other (see comment) (family will transport this pt home when ready)  Transition of Care Consult (CM Consult): Discharge Planning  Physical Therapy Consult: Yes  Occupational Therapy Consult: Yes  Current Support Network: Lives with Spouse, Own Home, Family Lives Nearby  Confirm Follow Up Transport: Self  Plan discussed with Pt/Family/Caregiver: Yes (plan at this time is to return home when ready for discharge )  Discharge Location  Discharge Placement: Home with family assistance  Pt is 44 year old male, in room 452. This pt was admitted due to.

## 2015-07-23 LAB — METABOLIC PANEL, COMPREHENSIVE
A-G Ratio: 0.7 — ABNORMAL LOW (ref 0.8–1.7)
ALT (SGPT): 1266 U/L — ABNORMAL HIGH (ref 16–61)
AST (SGOT): 750 U/L — ABNORMAL HIGH (ref 15–37)
Albumin: 3.3 g/dL — ABNORMAL LOW (ref 3.4–5.0)
Alk. phosphatase: 69 U/L (ref 45–117)
Anion gap: 14 mmol/L (ref 3.0–18)
BUN/Creatinine ratio: 6 — ABNORMAL LOW (ref 12–20)
BUN: 74 MG/DL — ABNORMAL HIGH (ref 7.0–18)
Bilirubin, total: 0.7 MG/DL (ref 0.2–1.0)
CO2: 25 mmol/L (ref 21–32)
Calcium: 9 MG/DL (ref 8.5–10.1)
Chloride: 88 mmol/L — ABNORMAL LOW (ref 100–108)
Creatinine: 11.8 MG/DL — ABNORMAL HIGH (ref 0.6–1.3)
GFR est AA: 6 mL/min/{1.73_m2} — ABNORMAL LOW (ref >60)
GFR est non-AA: 5 mL/min/{1.73_m2} — ABNORMAL LOW (ref >60)
Globulin: 4.7 g/dL — ABNORMAL HIGH (ref 2.0–4.0)
Glucose: 98 mg/dL (ref 74–99)
Potassium: 3.8 mmol/L (ref 3.5–5.5)
Protein, total: 8 g/dL (ref 6.4–8.2)
Sodium: 127 mmol/L — ABNORMAL LOW (ref 136–145)

## 2015-07-23 LAB — HIV 1/2 AB SCREEN W RFLX CONFIRM
HIV 1/2 Interpretation: NONREACTIVE
HIV 1/2 Interpretation: NONREACTIVE
HIV1/2 INTERPRETATION, HHIVI: NONREACTIVE
HIV1/2 INTERPRETATION, HHIVI: NONREACTIVE

## 2015-07-23 LAB — CBC WITH AUTOMATED DIFF
ABS. BASOPHILS: 0 10*3/uL (ref 0.0–0.06)
ABS. EOSINOPHILS: 0.4 10*3/uL (ref 0.0–0.4)
ABS. LYMPHOCYTES: 2 10*3/uL (ref 0.9–3.6)
ABS. MONOCYTES: 1 10*3/uL (ref 0.05–1.2)
ABS. NEUTROPHILS: 3.4 10*3/uL (ref 1.8–8.0)
BASOPHILS: 0 % (ref 0–2)
EOSINOPHILS: 5 % (ref 0–5)
HCT: 35 % — ABNORMAL LOW (ref 36.0–48.0)
HGB: 11.6 g/dL — ABNORMAL LOW (ref 13.0–16.0)
LYMPHOCYTES: 29 % (ref 21–52)
MCH: 28 PG (ref 24.0–34.0)
MCHC: 33.1 g/dL (ref 31.0–37.0)
MCV: 84.5 FL (ref 74.0–97.0)
MONOCYTES: 15 % — ABNORMAL HIGH (ref 3–10)
MPV: 10.8 FL (ref 9.2–11.8)
NEUTROPHILS: 51 % (ref 40–73)
PLATELET: 179 10*3/uL (ref 135–420)
RBC: 4.14 M/uL — ABNORMAL LOW (ref 4.70–5.50)
RDW: 14.6 % — ABNORMAL HIGH (ref 11.6–14.5)
WBC: 6.8 10*3/uL (ref 4.6–13.2)

## 2015-07-23 LAB — GLUCOSE, POC
Glucose (POC): 87 mg/dL (ref 70–110)
Glucose (POC): 127 mg/dL — ABNORMAL HIGH (ref 70–110)

## 2015-07-23 MED ORDER — LIDOCAINE HCL 2 % (20 MG/ML) IJ SOLN
20 mg/mL (2 %) | Freq: Once | INTRAMUSCULAR | Status: DC
Start: 2015-07-23 — End: 2015-07-23
  Administered 2015-07-23: 15:00:00 via INTRADERMAL

## 2015-07-23 MED ORDER — LIDOCAINE (PF) 10 MG/ML (1 %) IJ SOLN
10 mg/mL (1 %) | Freq: Once | INTRAMUSCULAR | Status: AC
Start: 2015-07-23 — End: 2015-07-23
  Administered 2015-07-23: 16:00:00 via INTRADERMAL

## 2015-07-23 MED FILL — HEPARIN (PORCINE) 5,000 UNIT/ML IJ SOLN: 5000 unit/mL | INTRAMUSCULAR | Qty: 1

## 2015-07-23 MED FILL — CALCIUM ACETATE 667 MG CAP: 667 mg | ORAL | Qty: 4

## 2015-07-23 MED FILL — HECTOROL 4 MCG/2 ML INTRAVENOUS SOLUTION: 4 mcg/2 mL | INTRAVENOUS | Qty: 2

## 2015-07-23 MED FILL — VITAMIN B-1 100 MG TABLET: 100 mg | ORAL | Qty: 1

## 2015-07-23 MED FILL — THERA 400 MCG TABLET: 400 mcg | ORAL | Qty: 1

## 2015-07-23 MED FILL — PANTOPRAZOLE 40 MG TAB, DELAYED RELEASE: 40 mg | ORAL | Qty: 1

## 2015-07-23 MED FILL — HYDRALAZINE 25 MG TAB: 25 mg | ORAL | Qty: 1

## 2015-07-23 MED FILL — METOPROLOL TARTRATE 50 MG TAB: 50 mg | ORAL | Qty: 1

## 2015-07-23 MED FILL — FOLIC ACID 1 MG TAB: 1 mg | ORAL | Qty: 1

## 2015-07-23 MED FILL — CITALOPRAM 20 MG TAB: 20 mg | ORAL | Qty: 1

## 2015-07-23 MED FILL — LIDOCAINE (PF) 10 MG/ML (1 %) IJ SOLN: 10 mg/mL (1 %) | INTRAMUSCULAR | Qty: 2

## 2015-07-23 NOTE — Other (Signed)
Received patient lying in bed. Alert and orient x 4. Patient denies pain and discomfort. Respiration even and unlabored. Skin warm and dry. Patient voiced last bowel movement was today and no noted blood was noted in stool. Nurse call light in reach. No apparent distress noted.

## 2015-07-23 NOTE — Progress Notes (Signed)
[facility]  [address]  [place], [place] [id number]    Progress Note    Patient: Robert ClassChester Rasmussen MRN: [id number]  SSN: [id number]    Date of Birth: [birth year]  Age: 44 y.o.  Sex: male      Admit Date: 07/20/2015    LOS: 3 days     Chief complaint:my liver     Impression:     1. Acute on chronic liver disease enz trending down   2. Hep c   3. CRF  4. Drug abuse   Plan:     1. F/u in the office for hep c therapy   2. Monitor liver enz every 2 to 3 days     Subjective:     The patient problems have reamained stable .  No n/v or abd pain .  No fevers or chills.      Objective:     Vitals:    07/23/15 1200 07/23/15 1230 07/23/15 1300 07/23/15 1330   BP: 123/73 119/77 113/75 120/90   Pulse: 74 75 78 82   Resp: 20 18 20 20    Temp:       TempSrc:       SpO2:            Physical Exam:   In dialysis     Lab/Data Review:  BMP:   Lab Results   Component Value Date/Time    NA 127 (L) 07/23/2015 03:40 AM    K 3.8 07/23/2015 03:40 AM    CL 88 (L) 07/23/2015 03:40 AM    CO2 25 07/23/2015 03:40 AM    AGAP 14 07/23/2015 03:40 AM    GLU 98 07/23/2015 03:40 AM    BUN 74 (H) 07/23/2015 03:40 AM    CREA 11.80 (H) 07/23/2015 03:40 AM    GFRAA 6 (L) 07/23/2015 03:40 AM    GFRNA 5 (L) 07/23/2015 03:40 AM     CMP:   Lab Results   Component Value Date/Time    NA 127 (L) 07/23/2015 03:40 AM    K 3.8 07/23/2015 03:40 AM    CL 88 (L) 07/23/2015 03:40 AM    CO2 25 07/23/2015 03:40 AM    AGAP 14 07/23/2015 03:40 AM    GLU 98 07/23/2015 03:40 AM    BUN 74 (H) 07/23/2015 03:40 AM    CREA 11.80 (H) 07/23/2015 03:40 AM    GFRAA 6 (L) 07/23/2015 03:40 AM    GFRNA 5 (L) 07/23/2015 03:40 AM    CA 9.0 07/23/2015 03:40 AM    ALB 3.3 (L) 07/23/2015 03:40 AM    TP 8.0 07/23/2015 03:40 AM    GLOB 4.7 (H) 07/23/2015 03:40 AM    AGRAT 0.7 (L) 07/23/2015 03:40 AM    SGOT 750 (H) 07/23/2015 03:40 AM    ALT 1266 (H) 07/23/2015 03:40 AM     CBC:   Lab Results   Component Value Date/Time    WBC 6.8 07/23/2015 03:40 AM     HGB 11.6 (L) 07/23/2015 03:40 AM    HCT 35.0 (L) 07/23/2015 03:40 AM    PLT 179 07/23/2015 03:40 AM     COAGS: No results found for: APTT, PTP, INR  Liver Panel:   Lab Results   Component Value Date/Time    ALB 3.3 (L) 07/23/2015 03:40 AM    TP 8.0 07/23/2015 03:40 AM    GLOB 4.7 (H) 07/23/2015 03:40 AM    AGRAT 0.7 (L) 07/23/2015 03:40 AM  SGOT 750 (H) 07/23/2015 03:40 AM    ALT 1266 (H) 07/23/2015 03:40 AM    AP 69 07/23/2015 03:40 AM          Principal Problem:    Altered mental status (07/20/2015)    Active Problems:    High blood pressure (01/13/2011)      Hyperkalemia (07/20/2015)      ESRD needing dialysis (HCC) (07/20/2015)      Drug abuse (07/20/2015)      Altered mental state (07/20/2015)      Transaminasemia (07/21/2015)          Signed By: Ihor Austin, MD     July 23, 2015

## 2015-07-23 NOTE — Progress Notes (Signed)
Hospitalist Progress Note    Patient: [facility] MRN: [id number]  CSN: [id number]    Date of Birth: November 24, 1970  Age: 44 y.o.  Sex: male    DOA: 07/20/2015 LOS:  LOS: 3 days          Seen on HD states he is feeling better today. States his Mom's funeral is scheduled for next Wednesday, he believes.     Assessment/Plan     1. Acute metabolic and toxic encephalopathy in the setting of uremia, electrolyte abnormalities, and heroin abuse. narcan given in ED with improvement. Head CT negative.   2. HyperKalemia with EKG changes resolved with medical management in ED and HD.   ??3. Elevated lfts continue to trend up . Follow HIV and GI recs.   4. etoh abuse - mtf  ??5. Elevated Trop - likely demand ischemia from fluid overload  6. Hyponatremia in the setting of etOH abuse and ESRD.  ??7. ESRD needing HD - per Dr. Regan LemmingZaitoun  8. Hypertension on home meds.   9. COPD - no acute exacerbation. -cont albuterol   10. Heroin abuse - HIV negative 2012, follow repeat.   11. Anemia of ckd - no epo per nephrology.   12. Hepatitis C - needs outpatient f/u for tx.   13. Medical noncompliance, documented  14. Full code. outpt unit chair [facility] [address] MWF 1:30 PM. Anticipate discharge in am.     Additional Notes:      Case discussed with:  Patient  Family  Nursing  Case Management  DVT Prophylaxis:  Lovenox  Hep SQ  SCDs  Coumadin   On Heparin gtt    Vital signs/Intake and Output:  Visit Vitals   ??? BP 119/77   ??? Pulse 75   ??? Temp 97.1 ??F (36.2 ??C) (Tympanic)   ??? Resp 18   ??? SpO2 98%     Current Shift:  12/16 0701 - 12/16 1900  In: 130 [P.O.:130]  Out: -   Last three shifts:       Awake alert and oriented. Seen on HD  Ncat. perrl  RRR  cta b.l   Soft nt  No edema. HD access to LUE +palpable thrill. Radial pulses 2+ b.l  Moving all 4s  No rash    Medications Reviewed      Labs: Results:       Chemistry Recent Labs      07/23/15   0340  07/22/15   0316  07/21/15   1830  07/21/15   0323   07/20/15   1545    GLU  98  90   --   60*   < >  100*   NA  127*  131*   --   133*   < >  127*   K  3.8  4.3   --   4.8   < >  7.9*   CL  88*  91*   --   92*   < >  85*   CO2  25  28   --   25   < >  13*   BUN  74*  51*   --   68*   < >  146*   CREA  11.80*  9.34*   --   13.20*   < >  23.40*   CA  9.0  9.4  9.7  7.6*   < >  6.6*   AGAP  14  12   --  16   < >  29*   BUCR  6*  5*   --   5*   < >  6*   AP  69  78   --    --    --   60   TP  8.0  9.3*   --    --    --   8.4*   ALB  3.3*  3.7   --    --    --   4.1   GLOB  4.7*  5.6*   --    --    --   4.3*   AGRAT  0.7*  0.7*   --    --    --   1.0    < > = values in this interval not displayed.      CBC w/Diff Recent Labs      07/23/15   0340  07/22/15   0316  07/21/15   0323   WBC  6.8  8.6  14.4*   RBC  4.14*  4.45*  3.99*   HGB  11.6*  12.6*  11.2*   HCT  35.0*  37.9  33.4*   PLT  179  173  142   GRANS  51  68  84*   LYMPH  29  18*  6*   EOS  5  1  0      Cardiac Enzymes Recent Labs      07/21/15   0323  07/20/15   2115   CPK  549*  593*   CKND1  0.5  0.6      Coagulation Recent Labs      07/22/15   1337  07/20/15   1545   PTP  14.9  17.9*   INR  1.2  1.5*   APTT   --   38.7*       Lipid Panel No results found for: CHOL, CHOLPOCT, CHOLX, CHLST, CHOLV, [id number], HDL, LDL, NLDLCT, DLDL, LDLC, DLDLP, 161096, VLDLC, VLDL, TGL, TGLX, TRIGL, [id number], TRIGP, TGLPOCT, [id number], CHHD, CHHDX   BNP No results for input(s): BNPP in the last 72 hours.   Liver Enzymes Recent Labs      07/23/15   0340   TP  8.0   ALB  3.3*   AP  69   SGOT  750*      Thyroid Studies Lab Results   Component Value Date/Time    TSH 1.35 01/01/2011 02:47 PM        Procedures/imaging: see electronic medical records for all procedures/Xrays and details which were not copied into this note but were reviewed prior to creation of Plan.

## 2015-07-23 NOTE — Progress Notes (Signed)
0813 Received on rounds in bed. A/OX4. Denies pain. No resp. distress noted. Call bell phone within reach. Dialysis patient. AV shunt left arm with positive bruitt and thrill Call bell phone wihtin reach. Side rails X3.

## 2015-07-23 NOTE — Progress Notes (Signed)
Attempted to see patient for PT treatment however patient is currently off the floor.  Will continue to follow.  Lori Surber, PT

## 2015-07-23 NOTE — Other (Signed)
Bedside and Verbal shift change report given to [name] LPN (oncoming nurse) by Adonis Huguenin, RN (offgoing nurse). Report included the following information SBAR, Kardex, MAR and Recent Results.    SITUATION:   ? Code Status: Full Code  ? Reason for Admission: Hyperkalemia  ? ESRD needing dialysis ([place])  ? Altered mental status  ? Hyperkalemia  ? Altered mental state    ? Hospital day: 3  ? Problem List:       Hospital Problems  Date Reviewed: August 08, 2015          Codes Class Noted POA    Transaminasemia ICD-10-CM: R74.0  ICD-9-CM: 790.4  07/21/2015 Unknown        Hyperkalemia ICD-10-CM: E87.5  ICD-9-CM: 276.7  August 08, 2015 Unknown        * (Principal)Altered mental status ICD-10-CM: R41.82  ICD-9-CM: 780.97  2015/08/08 Unknown        ESRD needing dialysis [facility]) ICD-10-CM: N18.6  ICD-9-CM: 585.6  08/08/15 Unknown        Drug abuse ICD-10-CM: F19.10  ICD-9-CM: 305.90  08-08-2015 Unknown        Altered mental state ICD-10-CM: R41.82  ICD-9-CM: 780.97  Aug 08, 2015 Unknown        High blood pressure ICD-10-CM: I10  ICD-9-CM: 401.9  01/13/2011 Yes              BACKGROUND:    Past Medical History:   Past Medical History   Diagnosis Date   ??? Alcohol abuse    ??? Diastolic heart failure ([place])    ??? Gastrointestinal bleeding    ??? Hepatitis C    ??? High blood pressure 01/13/2011   ??? History of echocardiogram 01/03/2011     EF 55-60%.  Gr 2 DDfx.  Increased RA pressure.  No R-L shunt.  Mild RAE.     ??? LE venous duplex 01/09/2011     No deep or superficial venous thrombosis in bilateral lower extremities.     ??? Migraine headache 01/13/2011         Patient taking anticoagulants yes, pt refusing to take heparin injections    ASSESSMENT:   ? Changes in Assessment Throughout Shift: none    ? Patient has Central Line: no Reasons if yes:   ? Patient has Foley Cath: no Reasons if yes:      ? Last Vitals:     Patient Vitals for the past 12 hrs:   Temp Pulse Resp BP SpO2   07/23/15 0409 97.5 ??F (36.4 ??C) 71 18 136/88 99 %    07/23/15 0034 97.9 ??F (36.6 ??C) 66 18 148/89 98 %   07/22/15 1900 97.8 ??F (36.6 ??C) 62 18 (!) 147/98 99 %     Vitals:    07/23/15 0034   BP: 148/89   Pulse: 66   Resp: 18   Temp: 97.9 ??F (36.6 ??C)   TempSrc:    SpO2: 98%       ? IV and DRAINS (will only show if present)   Peripheral IV 08/08/15 Right Arm-Site Assessment: Clean, dry, & intact  Peripheral IV 08-08-2015 Right Arm-Site Assessment: Clean, dry, & intact    ? WOUND (if present)   Wound Type:  none   Dressing present Dressing Present : No   Wound Concerns/Notes:  none    ? PAIN    Pain Assessment    Pain Intensity 1: 0 (07/22/15 2000)              Patient Stated  Pain Goal: 0  o Interventions for Pain:  none  o Intervention effective: no  o Time of last intervention: none given   o Reassessment Completed: no, no pain meds given    ? Last 3 Weights:  There were no vitals filed for this visit.  Weight change:     ? INTAKE/OUPUT    Current Shift:      Last three shifts: 12/14 0701 - 12/15 1900  In: 840 [P.O.:840]  Out: 3001   Recent Results (from the past 12 hour(s))   CBC WITH AUTOMATED DIFF    Collection Time: 07/23/15  3:40 AM   Result Value Ref Range    WBC 6.8 4.6 - 13.2 K/uL    RBC 4.14 (L) 4.70 - 5.50 M/uL    HGB 11.6 (L) 13.0 - 16.0 g/dL    HCT 35.0 (L) 36.0 - 48.0 %    MCV 84.5 74.0 - 97.0 FL    MCH 28.0 24.0 - 34.0 PG    MCHC 33.1 31.0 - 37.0 g/dL    RDW 14.6 (H) 11.6 - 14.5 %    PLATELET 179 135 - 420 K/uL    MPV 10.8 9.2 - 11.8 FL    NEUTROPHILS 51 40 - 73 %    LYMPHOCYTES 29 21 - 52 %    MONOCYTES 15 (H) 3 - 10 %    EOSINOPHILS 5 0 - 5 %    BASOPHILS 0 0 - 2 %    ABS. NEUTROPHILS 3.4 1.8 - 8.0 K/UL    ABS. LYMPHOCYTES 2.0 0.9 - 3.6 K/UL    ABS. MONOCYTES 1.0 0.05 - 1.2 K/UL    ABS. EOSINOPHILS 0.4 0.0 - 0.4 K/UL    ABS. BASOPHILS 0.0 0.0 - 0.06 K/UL    DF AUTOMATED     METABOLIC PANEL, COMPREHENSIVE    Collection Time: 07/23/15  3:40 AM   Result Value Ref Range    Sodium 127 (L) 136 - 145 mmol/L    Potassium 3.8 3.5 - 5.5 mmol/L     Chloride 88 (L) 100 - 108 mmol/L    CO2 25 21 - 32 mmol/L    Anion gap 14 3.0 - 18 mmol/L    Glucose 98 74 - 99 mg/dL    BUN 74 (H) 7.0 - 18 MG/DL    Creatinine 11.80 (H) 0.6 - 1.3 MG/DL    BUN/Creatinine ratio 6 (L) 12 - 20      GFR est AA 6 (L) >60 ml/min/1.19m    GFR est non-AA 5 (L) >60 ml/min/1.792m   Calcium 9.0 8.5 - 10.1 MG/DL    Bilirubin, total 0.7 0.2 - 1.0 MG/DL    ALT 1266 (H) 16 - 61 U/L    AST 750 (H) 15 - 37 U/L    Alk. phosphatase 69 45 - 117 U/L    Protein, total 8.0 6.4 - 8.2 g/dL    Albumin 3.3 (L) 3.4 - 5.0 g/dL    Globulin 4.7 (H) 2.0 - 4.0 g/dL    A-G Ratio 0.7 (L) 0.8 - 1.7       ? LAB RESULTS.  Recent Labs      07/22/15   0316  07/21/15   0323  07/20/15   1545   WBC  8.6  14.4*  15.7*   HGB  12.6*  11.2*  10.4*   HCT  37.9  33.4*  31.0*   PLT  173  142  149  Recent Labs      07/22/15   1337  07/22/15   0316  07/21/15   1830  07/21/15   0323  07/20/15   2115  07/20/15   1545   NA   --   131*   --   133*  135*  127*   K   --   4.3   --   4.8  4.1  7.9*   GLU   --   90   --   60*  45*  100*   BUN   --   51*   --   68*  61*  146*   CREA   --   9.34*   --   13.20*  11.60*  23.40*   CA   --   9.4  9.7  7.6*  8.8  6.6*   MG   --   2.8*   --   2.6*   --   3.3*   INR  1.2   --    --    --    --   1.5*       RECOMMENDATIONS AND DISCHARGE PLANNING     1. Pending tests/procedures/ Plan of Care or Other Needs: dialysis in am     2. Discharge plan for patient and Needs/Barriers: dialysis on discharge    3. Estimated Discharge Date: 07/27/15 Posted on Whiteboard in Patient???s Room: no      4. The patient's care plan was reviewed with the oncoming nurse.       "HEALS" SAFETY CHECK      Fall Risk    Total Score: 3    Safety Measures: Safety Measures: Bed/Chair-Wheels locked, Bed in low position, Call light within reach    A safety check occurred in the patient's room between off going nurse and oncoming nurse listed above.    The safety check included the below items  Area Items   H   High Alert Medications ? Verify all high alert medication drips (heparin, PCA, etc.)   E  Equipment ? Suction is set up for ALL patients (with yanker)  ? Red plugs utilized for all equipment (IV pumps, etc.)  ? WOW???s wiped down at end of shift.  ? Room stocked with oxygen, suction, and other unit-specific supplies   A  Alarms ? Bed alarm is set for fall risk patients  ? Ensure chair alarm is in place and activated if patient is up in a chair   L  Lines ? Check IV for any infiltration  ? Foley bag is empty if patient has a Foley   ? Tubing and IV bags are labeled   S  Safety   ? Room is clean, patient is clean, and equipment is clean.  ? Hallways are clear from equipment besides carts.   ? Fall bracelet on for fall risk patients  ? Ensure room is clear and free of clutter  ? Suction is set up for ALL patients (with yanker)  ? Hallways are clear from equipment besides carts.   ? Isolation precautions followed, supplies available outside room, sign posted     Adonis Huguenin, RN

## 2015-07-23 NOTE — Progress Notes (Signed)
Hemodialysis Rounding Note      Patient: Robert Pierce               Sex: male          DOA: 07/20/2015  1:51 PM        Date of Birth:  1971-07-02      Age:  44 y.o.        LOS:  LOS: 3 days     Subjective:     Robert ClassChester Pierce is a 44 y.o. African American who presents with Hyperkalemia  ESRD needing dialysis (HCC)  Altered mental status  Hyperkalemia  Altered mental state.  The patient is dialyzing utilizing the following method:Intermittent Hemodialysis        Complaints feels ok.     Current Facility-Administered Medications   Medication Dose Route Frequency   ??? lidocaine (PF) (XYLOCAINE) 10 mg/mL (1 %) injection 0.1 mL  0.1 mL IntraDERMal ONCE   ??? heparin (porcine) injection 5,000 Units  5,000 Units SubCUTAneous Q8H   ??? doxercalciferol (HECTOROL) 4 mcg/2 mL injection 2 mcg  2 mcg IntraVENous DIALYSIS MON, WED & FRI   ??? amLODIPine (NORVASC) tablet 10 mg  10 mg Oral DAILY   ??? citalopram (CELEXA) tablet 20 mg  20 mg Oral DAILY   ??? hydrALAZINE (APRESOLINE) tablet 25 mg  25 mg Oral TID   ??? metoprolol tartrate (LOPRESSOR) tablet 50 mg  50 mg Oral BID   ??? therapeutic multivitamin (THERAGRAN) tablet 1 Tab  1 Tab Oral DAILY   ??? pantoprazole (PROTONIX) tablet 40 mg  40 mg Oral DAILY   ??? thiamine (B-1) tablet 100 mg  100 mg Oral DAILY   ??? albuterol (PROVENTIL VENTOLIN) nebulizer solution 2.5 mg  2.5 mg Nebulization Q4H PRN   ??? calcium acetate (PHOSLO) capsule 2,668 mg  4 Cap Oral TID WITH MEALS   ??? folic acid (FOLVITE) tablet 1 mg  1 mg Oral DAILY   ??? naloxone (NARCAN) injection 0.4 mg  0.4 mg IntraVENous EVERY 2 MINUTES AS NEEDED       12/16 0701 - 12/16 1900  In: 130 [P.O.:130]  Out: -          Objective:     Blood pressure 119/77, pulse 75, temperature 97.1 ??F (36.2 ??C), temperature source Tympanic, resp. rate 18, SpO2 98 %.  Temp (24hrs), Avg:97.6 ??F (36.4 ??C), Min:97.1 ??F (36.2 ??C), Max:98.3 ??F (36.8 ??C)      Blood Pressure: BP: 119/77  Pulse: Pulse (Heart Rate): 75  Temp:  Temp: 97.1 ??F (36.2 ??C)     Artificial Kidney Dialyzer/Set Up Inspection: Revaclear   hours Duration of Treatment (hours): 3.5 hours   Heparin Bolus    Blood flow rate Blood Flow Rate (ml/min): 400 ml/min   Dialysate rate     Arterial Access Pressure Arterial Access Pressure (mmHg): -180  Venous Return Pressure Venous Return Pressure (mmHg): 230  Ultrafiltration Rate Goal/Amount of Fluid to Remove (mL): 3000 mL  Fluid Removal Fluid Removed (mL): 1853  Net Fluid Removal NET Fluid Removed (mL): 3000 ml      PHYSICAL EXAM:   HEENT:  Non icteric  NECK:  No JVD  CHEST AND LUNGS:  Clear   CVS:  Regular, no rub  EXT:  Left arm AVF    DATA REVIEW:    Labs: Results:       Chemistry Recent Labs      07/23/15   0340  07/22/15   0316  07/21/15  1830  07/21/15   0323   07/20/15   1545   GLU  98  90   --   60*   < >  100*   NA  127*  131*   --   133*   < >  127*   K  3.8  4.3   --   4.8   < >  7.9*   CL  88*  91*   --   92*   < >  85*   CO2  25  28   --   25   < >  13*   BUN  74*  51*   --   68*   < >  146*   CREA  11.80*  9.34*   --   13.20*   < >  23.40*   CA  9.0  9.4  9.7  7.6*   < >  6.6*   AGAP  14  12   --   16   < >  29*   BUCR  6*  5*   --   5*   < >  6*   AP  69  78   --    --    --   60   TP  8.0  9.3*   --    --    --   8.4*   ALB  3.3*  3.7   --    --    --   4.1   GLOB  4.7*  5.6*   --    --    --   4.3*   AGRAT  0.7*  0.7*   --    --    --   1.0    < > = values in this interval not displayed.      CBC w/Diff Recent Labs      07/23/15   0340  07/22/15   0316  07/21/15   0323   WBC  6.8  8.6  14.4*   RBC  4.14*  4.45*  3.99*   HGB  11.6*  12.6*  11.2*   HCT  35.0*  37.9  33.4*   PLT  179  173  142   GRANS  51  68  84*   LYMPH  29  18*  6*   EOS  5  1  0      Coagulation Recent Labs      07/22/15   1337  07/20/15   1545   PTP  14.9  17.9*   INR  1.2  1.5*   APTT   --   38.7*       Iron/Ferritin No results for input(s): IRON in the last 72 hours.    No lab exists for component: TIBCCALC   BNP No results for input(s): BNPP in the last 72 hours.    Cardiac Enzymes Recent Labs      07/21/15   0323  07/20/15   2115   CPK  549*  593*   CKND1  0.5  0.6      Liver Enzymes Recent Labs      07/23/15   0340   TP  8.0   ALB  3.3*   AP  69   SGOT  750*      Thyroid Studies Lab Results   Component Value Date/Time    TSH 1.35 01/01/2011 02:47 PM  CULTURE:   )  Recent Labs      07/21/15   1840  07/21/15   1830   CULT  NO GROWTH 2 DAYS  NO GROWTH 2 DAYS     Recent Labs      07/21/15   1840  07/21/15   1830   CULT  NO GROWTH 2 DAYS  NO GROWTH 2 DAYS       Assessment/Plan:     End Stage Renal Disease:  Patient is tolerating dialysis treatment well..  Additionally the patient has experienced normal dialysis treatment during dialysis.  Dry weight Estimated Weight: 75.3 kg (166 lb 0.1 oz) same.    Pt says he will be in town until [place] of next week. He has outpt unit chair [facility] [place]. MWF 1:30 PM    At 1:01 PM on 07/23/2015, I saw and examined patient during hemodialysis treatment. The patient was receiving hemodialysis for treatment of end stage renal disease. I have also reviewed vital signs, intake and output, lab results and recent events, and agreed with today's dialysis order.      Anemia:  Hold ESA    Renal Metabolic Bone Disease:  Cont binder                      Hypertension: controlled    Access: Fistula adequate monitoring/no changes         Tonette Bihari, MD  07/23/2015

## 2015-07-23 NOTE — Other (Signed)
ACUTE HEMODIALYSIS FLOW SHEET    HEMODIALYSIS ORDERS: Physician: Mortimer Fries     Dialyzer: Revaclear         Duration: 3.5 hr  BFR: 800   DFR: 400   Dialysate:  Temp 37.0  K+   3.0    Ca+  3.0  Na 140  Bicarb 40   Weight:  75.3 kg    Bed Scale      Unable to Obtain       Dry weight/UF Goal: 3000  Access AVF  Needle Gauge 15    Heparin   Bolus      Units     Hourly       Units    None     Catheter locking solution n/a    Pre BP:   158/77    Pulse:     68     Temperature:   97.1  Respirations: 20  Tx: NS      ml/Bolus  Other         N/A   Labs: Pre        Post:         N/A   Additional Orders(medications, blood products, hypotension management):        N/A      Time Out/Safety Check   [facility] Consent Verified     CATHETER ACCESS: N/A   Right   Left   IJ     Fem    First use X-ray verified     Tunnel                 Non Tunneled   No S/S infection  Redness  Drainage Cultured Swelling Pain   Medical Aseptic Prep Utilized   Dressing Changed   Biopatch  Date:     Clotted   Patent   Flows: Good  Poor  Reversed   If access problem, Dr. notified: Yes    N/A  Date:           GRAFT/FISTULA ACCESS:  N/A     Right     Left     UE     LE   AVG   AVF        Buttonhole    Medical Aseptic Prep Utilized   No S/S infection  Redness  Drainage Cultured Swelling Pain    Bruit:    Strong     Weak       Thrill :    Strong     Weak       Needle Gauge: 15   Length: 1''   If access problem, Dr. notified: Yes     N/A  Date:        Please describe access if present and not used:       GENERAL ASSESSMENT:    LUNGS:  Rate Regular  SaO2%    98  N/A     Clear   Coarse   Crackles   Wheezing         Diminished     Location : RLL   LLL    RUL  LUL   Cough: Productive  Dry  N/A   Respirations:  Easy  Labored   Therapy:  RA  NC  l/min    Mask: NRB Venti       O2%                  Ventilator  Intubated   [employer]  BiPaP   CARDIAC: Regular       Irregular    Pericardial Rub   JVD          Monitored   Bedside   Remotely monitored  N/A  Rhythm:     EDEMA:  None  Generalized   Pitting  1     2     3     4                   Facial   Pedal    UE   LE   SKIN:    Warm   Hot      Cold    Dry      Pale    Diaphoretic                   Flushed   Jaundiced   Cyanotic   Rash   Weeping   LOC:     Alert      Oriented:     Person      Place  Time                Confused   Lethargic   Medicated   Non-responsive     GI / ABDOMEN:   Flat     Distended     Soft     Firm     Obese                              Diarrhea   Bowel Sounds   Nausea   Vomiting      GU / URINE ASSESSMENT: Voiding    Oliguria   Anuria     Foley      Incontinent      Incontinent Brief        Bathroom Privileges     PAIN:  0 1  2   3   4   5   6   7   8   9   10             Scale 0-10  Action/Follow Up:    MOBILITY:   Amb     Amb/Assist     Bed     Wheelchair   Stretcher      All Vitals and Treatment Details on Attached [facility]: [facility]          Room # 452      1st Time Acute   Stat   Routine   Urgent      Acute Room    Bedside   ICU/CCU   ER   Isolation Precautions:   Dialysis    Airborne    Contact     Reverse   Special Considerations:          Blood Consent Verified N/A     ALLERGIES:    NKA          Code Status:   Full Code   DNR   Other           HBsAg ONLY: Date Drawn 07/20/15         Negative Positive Unknown   HBsAb: Date 07/20/15     Susceptible    Immune10 Not Drawn   Drawn     Current Labs:    Date of Labs:  Today        Results for Robert Pierce, Robert Pierce (MRN [id number]) as of 07/23/2015 11:59   Ref. Range 07/23/2015 03:40 07/23/2015 07:30   GLUCOSE,FAST - POC Latest Ref Range: 70 - 110 mg/dL  87   WBC Latest Ref Range: 4.6 - 13.2 K/uL 6.8    RBC Latest Ref Range: 4.70 - 5.50 M/uL 4.14 (L)    HGB Latest Ref Range: 13.0 - 16.0 g/dL 11.6 (L)    HCT Latest Ref Range: 36.0 - 48.0 % 35.0 (L)    MCV Latest Ref Range: 74.0 - 97.0 FL 84.5    MCH Latest Ref Range: 24.0 - 34.0 PG 28.0    MCHC Latest Ref Range: 31.0 - 37.0 g/dL 33.1    RDW Latest Ref Range: 11.6 - 14.5 % 14.6 (H)     PLATELET Latest Ref Range: 135 - 420 K/uL 179    MPV Latest Ref Range: 9.2 - 11.8 FL 10.8    NEUTROPHILS Latest Ref Range: 40 - 73 % 51    LYMPHOCYTES Latest Ref Range: 21 - 52 % 29    MONOCYTES Latest Ref Range: 3 - 10 % 15 (H)    EOSINOPHILS Latest Ref Range: 0 - 5 % 5    BASOPHILS Latest Ref Range: 0 - 2 % 0    DF Latest Units:   AUTOMATED    ABS. NEUTROPHILS Latest Ref Range: 1.8 - 8.0 K/UL 3.4    ABS. LYMPHOCYTES Latest Ref Range: 0.9 - 3.6 K/UL 2.0    ABS. MONOCYTES Latest Ref Range: 0.05 - 1.2 K/UL 1.0    ABS. EOSINOPHILS Latest Ref Range: 0.0 - 0.4 K/UL 0.4    ABS. BASOPHILS Latest Ref Range: 0.0 - 0.06 K/UL 0.0    Sodium Latest Ref Range: 136 - 145 mmol/L 127 (L)    Potassium Latest Ref Range: 3.5 - 5.5 mmol/L 3.8    Chloride Latest Ref Range: 100 - 108 mmol/L 88 (L)    CO2 Latest Ref Range: 21 - 32 mmol/L 25    Anion gap Latest Ref Range: 3.0 - 18 mmol/L 14    Glucose Latest Ref Range: 74 - 99 mg/dL 98    BUN Latest Ref Range: 7.0 - 18 MG/DL 74 (H)    Creatinine Latest Ref Range: 0.6 - 1.3 MG/DL 11.80 (H)    BUN/Creatinine ratio Latest Ref Range: 12 - 20   6 (L)    Calcium Latest Ref Range: 8.5 - 10.1 MG/DL 9.0    GFR est non-AA Latest Ref Range: >60 ml/min/1.84m 5 (L)    GFR est AA Latest Ref Range: >60 ml/min/1.777m6 (L)    Bilirubin, total Latest Ref Range: 0.2 - 1.0 MG/DL 0.7    Protein, total Latest Ref Range: 6.4 - 8.2 g/dL 8.0    Albumin Latest Ref Range: 3.4 - 5.0 g/dL 3.3 (L)    Globulin Latest Ref Range: 2.0 - 4.0 g/dL 4.7 (H)    A-G Ratio Latest Ref Range: 0.8 - 1.7   0.7 (L)    ALT Latest Ref Range: 16 - 61 U/L 1266 (H)    AST Latest Ref Range: 15 - 37 U/L 750 (H)    Alk. phosphatase Latest Ref Range: 45 - 117 U/L 69    HIV 1/2 AB SCREEN W RFLX CONFIRM Unknown Rpt     Cut and paste current labs here.  DIET:   Renal     Other      NPO       Diabetic       PRIMARY NURSE REPORT: First initial/Last name/Title      Pre Dialysis: Robert Pierce    Time: 0915      EDUCATION:     Patient  Other         Knowledge Basis: None Minimal  Substantial   Barriers to learning  N/A    Access Care      S&S of infection      Fluid Management     K+     Procedural    Albumin      Medications      Tx Options      Transplant      Diet      Other   Teaching Tools:   Explain   Demo   Handouts  Video  Patient response:    Verbalized understanding   Teach back   Return demonstration  Requires follow up   Inappropriate due to            [place] ??? Before each treatment:     Machine Number:                   [facility]                                   Unit Machine # 6 with centralized RO                                   Portable Machine #1/RO serial # [id number]                                   Portable Machine #2/RO serial # [id number]                                   Portable Machine #3/RO serial # [id number]                                                                                                       [place] #11/RO serial # [id number]                                    Portable Machine #12/RO serial # [id number]  Portable Machine #13/RO serial #  [id number]      Alarm Test:  Pass time [phone]         Other:          RO/Machine Log Complete      Temp    37.0            Extracorporeal Circuit Tested for integrity   Dialysate: pH  7.2 Conductivity: Meter   14.0     HD Machine   14.2                  TCD: 14.0  Dialyzer Lot # *[id number]          Blood Tubing Lot # [id number]          Saline Lot #  [id number]     CHLORINE TESTING-Before each treatment and every 4 hours    Total Chlorine:  less than 0.1 ppm  Time: 0956 4 Hr/2nd Check Time: 1356 Neg   (if greater than 0.1 ppm from Primary then every 30 minutes from Secondary)      TREATMENT INITIATION ??? with Dialysis Precautions:    All Connections Secured                  Saline Line Double Clamped    Venous Parameters Set                   Arterial Parameters Set     Prime Given  250 ml               Air Foam Detector Engaged      Treatment Initiation Note: Hemodialysis tx initiated successfully. No signs and symptoms of acute distress noted at this time. UF goal set at 3000 mL as tolerated. Tx scheduled time 3.5 hours. Accessed left arm fistula utilizing medically aseptic technique. Both lines patent with good blood flow. Will monitor patient closely throughout tx. Pre-tx report received from Robert Burkitt, RN.     Medication Dose Volume Route Initials Dialyzer Cleared:  Good  Fair   Poor    Blood processed: 78.8 L  UF Removed  3000 Ml    Post Wt: 72.2    kg  POst BP:   123/89    Pulse: 86      Respirations: 20  Temperature: 97.3        None Given                         Post Tx Vascular Access: AVF/AVG: Bleeding stopped Art 10 min. Ven. 9 Min   N/A                                   Catheter: Locking solution: Heparin 1:1000 Art. N/a    Lawson Fiscal n/a  N/A                                 Post Assessment:                                    Skin:   Warm   Dry  Diaphoretic     Flushed   Pale  Cyanotic   [facility] Signatures Title Initials  Time Lungs:  Clear  Course   Crackles   Wheezing  Diminished   Shon Millet RN [facility] 1500 Cardiac:  Regular    Irregular    Monitor   N/A  Rhythm:           Edema:   None     General      Facial    Pedal     UE     LE       Pain: 0  1  2   3  4   5   6   7   8   9   10          Post Treatment Note: The K bath was changed to K 3 based on laboratory value. The nephrologist was notified. Patient tolerated HD treatment well. UF goal met able to successfully remove 3 L's of fluid. Rinsed back blood. Flushed both lines with NS. Both lines clamped and capped. Pt and VS stable at this time. Report given to Robert Burkitt, RN       POST TREATMENT PRIMARY NURSE HANDOFF REPORT:     First initial/Last name/Title         Post Dialysis: Robert Pierce Time:  1500     Abbreviations: AVG-arterial venous graft, AVF-arterial venous fistula, IJ-Internal Jugular, Subcl-Subclavian, Fem-Femoral, Tx-treatment, AP/HR-apical heart rate, DFR-dialysate flow rate, BFR-blood flow rate, AP-arterial pressure, VP-venous pressure, UF-ultrafiltrate, TMP-transmembrane pressure, Ven-Venous, Art-Arterial, RO-Reverse Osmosis

## 2015-07-24 LAB — CBC WITH AUTOMATED DIFF
ABS. BASOPHILS: 0 10*3/uL (ref 0.0–0.1)
ABS. EOSINOPHILS: 0.3 10*3/uL (ref 0.0–0.4)
ABS. LYMPHOCYTES: 1.7 10*3/uL (ref 0.9–3.6)
ABS. MONOCYTES: 1.8 10*3/uL — ABNORMAL HIGH (ref 0.05–1.2)
ABS. NEUTROPHILS: 3.2 10*3/uL (ref 1.8–8.0)
BASOPHILS: 0 % (ref 0–2)
EOSINOPHILS: 5 % (ref 0–5)
HCT: 36.3 % (ref 36.0–48.0)
HGB: 12 g/dL — ABNORMAL LOW (ref 13.0–16.0)
LYMPHOCYTES: 24 % (ref 21–52)
MCH: 28.3 PG (ref 24.0–34.0)
MCHC: 33.1 g/dL (ref 31.0–37.0)
MCV: 85.6 FL (ref 74.0–97.0)
MONOCYTES: 26 % — ABNORMAL HIGH (ref 3–10)
MPV: 10.9 FL (ref 9.2–11.8)
NEUTROPHILS: 45 % (ref 40–73)
PLATELET: 185 10*3/uL (ref 135–420)
RBC: 4.24 M/uL — ABNORMAL LOW (ref 4.70–5.50)
RDW: 14.8 % — ABNORMAL HIGH (ref 11.6–14.5)
WBC: 7.1 10*3/uL (ref 4.6–13.2)

## 2015-07-24 LAB — METABOLIC PANEL, COMPREHENSIVE
A-G Ratio: 0.7 — ABNORMAL LOW (ref 0.8–1.7)
ALT (SGPT): 911 U/L — ABNORMAL HIGH (ref 16–61)
AST (SGOT): 356 U/L — ABNORMAL HIGH (ref 15–37)
Albumin: 3.4 g/dL (ref 3.4–5.0)
Alk. phosphatase: 65 U/L (ref 45–117)
Anion gap: 8 mmol/L (ref 3.0–18)
BUN/Creatinine ratio: 5 — ABNORMAL LOW (ref 12–20)
BUN: 44 MG/DL — ABNORMAL HIGH (ref 7.0–18)
Bilirubin, total: 0.6 MG/DL (ref 0.2–1.0)
CO2: 31 mmol/L (ref 21–32)
Calcium: 8.7 MG/DL (ref 8.5–10.1)
Chloride: 95 mmol/L — ABNORMAL LOW (ref 100–108)
Creatinine: 8.32 MG/DL — ABNORMAL HIGH (ref 0.6–1.3)
GFR est AA: 9 mL/min/{1.73_m2} — ABNORMAL LOW (ref >60)
GFR est non-AA: 7 mL/min/{1.73_m2} — ABNORMAL LOW (ref >60)
Globulin: 4.8 g/dL — ABNORMAL HIGH (ref 2.0–4.0)
Glucose: 85 mg/dL (ref 74–99)
Potassium: 4.3 mmol/L (ref 3.5–5.5)
Protein, total: 8.2 g/dL (ref 6.4–8.2)
Sodium: 134 mmol/L — ABNORMAL LOW (ref 136–145)

## 2015-07-24 LAB — GLUCOSE, POC
Glucose (POC): 106 mg/dL (ref 70–110)
Glucose (POC): 128 mg/dL — ABNORMAL HIGH (ref 70–110)
Glucose (POC): 128 mg/dL — ABNORMAL HIGH (ref 70–110)

## 2015-07-24 LAB — RBC COMMENTS

## 2015-07-24 MED ORDER — CALCIUM ACETATE 667 MG CAP
667 mg | ORAL_CAPSULE | Freq: Three times a day (TID) | ORAL | 0 refills | Status: AC
Start: 2015-07-24 — End: ?

## 2015-07-24 MED ORDER — FOLIC ACID 1 MG TAB
1 mg | ORAL_TABLET | Freq: Every day | ORAL | 0 refills | Status: AC
Start: 2015-07-24 — End: ?

## 2015-07-24 MED FILL — CITALOPRAM 20 MG TAB: 20 mg | ORAL | Qty: 1

## 2015-07-24 MED FILL — AMLODIPINE 10 MG TAB: 10 mg | ORAL | Qty: 1

## 2015-07-24 MED FILL — CALCIUM ACETATE 667 MG CAP: 667 mg | ORAL | Qty: 4

## 2015-07-24 MED FILL — METOPROLOL TARTRATE 50 MG TAB: 50 mg | ORAL | Qty: 1

## 2015-07-24 MED FILL — HEPARIN (PORCINE) 5,000 UNIT/ML IJ SOLN: 5000 unit/mL | INTRAMUSCULAR | Qty: 1

## 2015-07-24 MED FILL — FOLIC ACID 1 MG TAB: 1 mg | ORAL | Qty: 1

## 2015-07-24 MED FILL — VITAMIN B-1 100 MG TABLET: 100 mg | ORAL | Qty: 1

## 2015-07-24 MED FILL — THERA 400 MCG TABLET: 400 mcg | ORAL | Qty: 1

## 2015-07-24 MED FILL — HYDRALAZINE 25 MG TAB: 25 mg | ORAL | Qty: 1

## 2015-07-24 MED FILL — PANTOPRAZOLE 40 MG TAB, DELAYED RELEASE: 40 mg | ORAL | Qty: 1

## 2015-07-24 NOTE — Progress Notes (Signed)
Pt discharged home with famiy/Friend.

## 2015-07-24 NOTE — Progress Notes (Signed)
[facility]  [address]  [place], [place] [address]    Progress Note    Patient: Robert Pierce MRN: [id number]  SSN: [id number]    Date of Birth: [birth year]  Age: 44 y.o.  Sex: male      Admit Date: 07/20/2015    LOS: 4 days     Chief complaint:my liver     Impression:     1. Resolving acute on chronic hep not clear what  Caused the acute hep issue  2. Hep c   3. Drug abuse    Plan:     1. F/u with [place] in 3 months regarding hep c therapy  2. Check liver enz Monday   3. NA rec     Call for questions or concerns available as needed       Subjective:     The patient problems have remained stable no n/v abd pain fevers chills n/v or jaundice.  Liver enz are rapidly going down .      Objective:     Vitals:    07/23/15 2032 07/24/15 0005 07/24/15 0451 07/24/15 0738   BP: 121/78 121/79 114/84 (!) 134/94   Pulse: 81 72 74 68   Resp: Temp: 99.9 ??F (37.7 ??C) 99 ??F (37.2 ??C) 98.2 ??F (36.8 ??C) 97.4 ??F (36.3 ??C)   TempSrc:       SpO2: 99% 100% 97% 100%        Physical Exam:   GENERAL: alert, cooperative, no distress, appears stated age  ABDOMEN: soft, non-tender. Bowel sounds normal. No masses,  no organomegaly    Lab/Data Review:  BMP:   Lab Results   Component Value Date/Time    NA 134 (L) 07/24/2015 02:49 AM    K 4.3 07/24/2015 02:49 AM    CL 95 (L) 07/24/2015 02:49 AM    CO2 31 07/24/2015 02:49 AM    AGAP 8 07/24/2015 02:49 AM    GLU 85 07/24/2015 02:49 AM    BUN 44 (H) 07/24/2015 02:49 AM    CREA 8.32 (H) 07/24/2015 02:49 AM    GFRAA 9 (L) 07/24/2015 02:49 AM    GFRNA 7 (L) 07/24/2015 02:49 AM     CMP:   Lab Results   Component Value Date/Time    NA 134 (L) 07/24/2015 02:49 AM    K 4.3 07/24/2015 02:49 AM    CL 95 (L) 07/24/2015 02:49 AM    CO2 31 07/24/2015 02:49 AM    AGAP 8 07/24/2015 02:49 AM    GLU 85 07/24/2015 02:49 AM    BUN 44 (H) 07/24/2015 02:49 AM    CREA 8.32 (H) 07/24/2015 02:49 AM    GFRAA 9 (L) 07/24/2015 02:49 AM    GFRNA 7 (L) 07/24/2015 02:49 AM     CA 8.7 07/24/2015 02:49 AM    ALB 3.4 07/24/2015 02:49 AM    TP 8.2 07/24/2015 02:49 AM    GLOB 4.8 (H) 07/24/2015 02:49 AM    AGRAT 0.7 (L) 07/24/2015 02:49 AM    SGOT 356 (H) 07/24/2015 02:49 AM    ALT 911 (H) 07/24/2015 02:49 AM     CBC:   Lab Results   Component Value Date/Time    WBC 7.1 07/24/2015 02:49 AM    HGB 12.0 (L) 07/24/2015 02:49 AM    HCT 36.3 07/24/2015 02:49 AM    PLT 185 07/24/2015 02:49 AM     COAGS: No results found  for: APTT, PTP, INR  Liver Panel:   Lab Results   Component Value Date/Time    ALB 3.4 07/24/2015 02:49 AM    TP 8.2 07/24/2015 02:49 AM    GLOB 4.8 (H) 07/24/2015 02:49 AM    AGRAT 0.7 (L) 07/24/2015 02:49 AM    SGOT 356 (H) 07/24/2015 02:49 AM    ALT 911 (H) 07/24/2015 02:49 AM    AP 65 07/24/2015 02:49 AM          Principal Problem:    Altered mental status (07/20/2015)    Active Problems:    High blood pressure (01/13/2011)      Hyperkalemia (07/20/2015)      ESRD needing dialysis (HCC) (07/20/2015)      Drug abuse (07/20/2015)      Altered mental state (07/20/2015)      Transaminasemia (07/21/2015)          Signed By: Ihor AustinJ Mark Terance Pomplun, MD     July 24, 2015

## 2015-07-24 NOTE — Discharge Summary (Signed)
Discharge Summary    Patient: Robert Pierce MRN: [id number]  CSN: [id number]    Date of Birth: 1970/09/02  Age: 44 y.o.  Sex: male    DOA: 07-21-2015 LOS:  LOS: 4 days   Discharge Date:      Admission Diagnoses: Hyperkalemia  ESRD needing dialysis [facility])  Altered mental status  Hyperkalemia  Altered mental state    Discharge Diagnoses:    Problem List as of 07/24/2015  Date Reviewed: 07/21/2015          Codes Class Noted - Resolved    Transaminasemia ICD-10-CM: R74.0  ICD-9-CM: 790.4  07/21/2015 - Present        ESRD needing dialysis ([place]) ICD-10-CM: N18.6  ICD-9-CM: 585.6  07-21-15 - Present        Drug abuse ICD-10-CM: F19.10  ICD-9-CM: 305.90  07/21/2015 - Present        ESRD (end stage renal disease) ([place]) ICD-10-CM: N18.6  ICD-9-CM: 585.6  07/13/2015 - Present        Shortness of breath ICD-10-CM: R06.02  ICD-9-CM: 786.05  07/13/2015 - Present        Iron deficiency anemia ICD-10-CM: D50.9  ICD-9-CM: 280.9  07/13/2015 - Present        ETOH abuse ICD-10-CM: F10.10  ICD-9-CM: 305.00  02/21/2011 - Present        Migraine headache ICD-10-CM: T25.498  ICD-9-CM: 346.90  01/13/2011 - Present        Borderline high cholesterol ICD-10-CM: E78.9  ICD-9-CM: 272.9  01/13/2011 - Present        Diastolic heart failure ([place]) ICD-10-CM: I50.30  ICD-9-CM: 428.30  01/13/2011 - Present        RESOLVED: Hyperkalemia ICD-10-CM: E87.5  ICD-9-CM: 276.7  July 21, 2015 - 07/24/2015        * (Principal)RESOLVED: Altered mental status ICD-10-CM: R41.82  ICD-9-CM: 780.97  07/21/2015 - 07/24/2015        RESOLVED: Altered mental state ICD-10-CM: R41.82  ICD-9-CM: 780.97  07-21-2015 - 07/24/2015        RESOLVED: Diastolic heart failure ([place]) ICD-10-CM: I50.30  ICD-9-CM: 428.30  Unknown - 02/21/2011        RESOLVED: High blood pressure ICD-10-CM: I10  ICD-9-CM: 401.9  01/13/2011 - 07/24/2015            Reason for Admission  44 y.o. with pmhx of heroine abuse, esrd on hd, htn, alcohol abuse was  brought to the hospital for evaluation of change in mental status. Patient was recently discharged from here after being admitted for sob secondary missing his dialysis. Upon discharge, with the help of Nephrologist, his outpatient HD was set up but he did not go. Over the next several days his brother noticed decline in his mental status; patient had also used heroin during this time period. No other complaints including sob, chest pain, or lightheadedness.   Upon arrival to the ED he had potassium of 8.0 with EKG changed of peaked T waves. He was give Kayexalate, insulin, albuterol, D50. He was also given Narcan with slightly improvement in his mental status. Nephrology was consulted who started the patient on HD, and decision was made to admit him.    Discharge Condition: good    PHYSICAL EXAM at discharge.  Visit Vitals   ??? BP (!) 134/94 (BP 1 Location: Right arm, BP Patient Position: At rest)   ??? Pulse 68   ??? Temp 97.4 ??F (36.3 ??C)   ??? Resp 12   ??? SpO2 100%     Awake  alert and oriented x4. Ambulating in room unassisted and w/o difficulty.   Ncat. perrl  RRR  cta b.l   Soft nt  No edema. HD access to LUE +palpable thrill. Radial pulses 2+ b.l  Moving all 4s. No focal deficit.  No rash    Hospital Course:   1. Acute metabolic and toxic encephalopathy in the setting of uremia, electrolyte abnormalities, and heroin abuse. narcan given in ED with improvement. Head CT negative.   2. HyperKalemia with EKG changes resolved with medical management in ED and HD.   ??3. Elevated lfts continue to trend up . Follow HIV and GI recs.   4. etoh abuse - mtf  ??5. Elevated Trop - likely demand ischemia from fluid overload  6. Hyponatremia in the setting of etOH abuse and ESRD.  ??7. ESRD needing HD - per Dr. Silverio Lay  8. Hypertension on home meds.   9. COPD - no acute exacerbation. -cont albuterol   10. Heroin abuse - HIV negative 2012, repeat negative.   11. Anemia of ckd - no epo per nephrology.    12. Hepatitis C - needs outpatient f/u for tx.   13. Medical noncompliance, documented  14. Full code. outpt unit chair [place], [employer]. MWF 1:30 PM. Discharge to home. Patient agrees; all questions answered to the best of my ability.     Consults: nephrology Dr. Marlene Lard  GI Dr Kellie Simmering    Significant Diagnostic Studies: labs:   Recent Results (from the past 24 hour(s))   GLUCOSE, POC    Collection Time: 07/23/15  3:32 PM   Result Value Ref Range    Glucose (POC) 127 (H) 70 - 110 mg/dL   GLUCOSE, POC    Collection Time: 07/23/15 10:10 PM   Result Value Ref Range    Glucose (POC) 106 70 - 110 mg/dL   CBC WITH AUTOMATED DIFF    Collection Time: 07/24/15  2:49 AM   Result Value Ref Range    WBC 7.1 4.6 - 13.2 K/uL    RBC 4.24 (L) 4.70 - 5.50 M/uL    HGB 12.0 (L) 13.0 - 16.0 g/dL    HCT 36.3 36.0 - 48.0 %    MCV 85.6 74.0 - 97.0 FL    MCH 28.3 24.0 - 34.0 PG    MCHC 33.1 31.0 - 37.0 g/dL    RDW 14.8 (H) 11.6 - 14.5 %    PLATELET 185 135 - 420 K/uL    MPV 10.9 9.2 - 11.8 FL    NEUTROPHILS 45 40 - 73 %    LYMPHOCYTES 24 21 - 52 %    MONOCYTES 26 (H) 3 - 10 %    EOSINOPHILS 5 0 - 5 %    BASOPHILS 0 0 - 2 %    ABS. NEUTROPHILS 3.2 1.8 - 8.0 K/UL    ABS. LYMPHOCYTES 1.7 0.9 - 3.6 K/UL    ABS. MONOCYTES 1.8 (H) 0.05 - 1.2 K/UL    ABS. EOSINOPHILS 0.3 0.0 - 0.4 K/UL    ABS. BASOPHILS 0.0 0.0 - 0.1 K/UL    DF AUTOMATED     METABOLIC PANEL, COMPREHENSIVE    Collection Time: 07/24/15  2:49 AM   Result Value Ref Range    Sodium 134 (L) 136 - 145 mmol/L    Potassium 4.3 3.5 - 5.5 mmol/L    Chloride 95 (L) 100 - 108 mmol/L    CO2 31 21 - 32 mmol/L    Anion gap 8 3.0 -  18 mmol/L    Glucose 85 74 - 99 mg/dL    BUN 44 (H) 7.0 - 18 MG/DL    Creatinine 8.32 (H) 0.6 - 1.3 MG/DL    BUN/Creatinine ratio 5 (L) 12 - 20      GFR est AA 9 (L) >60 ml/min/1.62m    GFR est non-AA 7 (L) >60 ml/min/1.735m   Calcium 8.7 8.5 - 10.1 MG/DL    Bilirubin, total 0.6 0.2 - 1.0 MG/DL    ALT 911 (H) 16 - 61 U/L    AST 356 (H) 15 - 37 U/L     Alk. phosphatase 65 45 - 117 U/L    Protein, total 8.2 6.4 - 8.2 g/dL    Albumin 3.4 3.4 - 5.0 g/dL    Globulin 4.8 (H) 2.0 - 4.0 g/dL    A-G Ratio 0.7 (L) 0.8 - 1.7     RBC COMMENTS    Collection Time: 07/24/15  2:49 AM   Result Value Ref Range    RBC COMMENTS NORMOCYTIC/NORMOCHROMIC     GLUCOSE, POC    Collection Time: 07/24/15  8:52 AM   Result Value Ref Range    Glucose (POC) 128 (H) 70 - 110 mg/dL     All Micro Results     Procedure Component Value Units Date/Time    CULTURE, BLOOD [[id number]]ollected:  07/21/15 1830    Order Status:  Completed Specimen:  Blood from Blood Updated:  07/24/15 0643     Special Requests: NO SPECIAL REQUESTS        Culture result: NO GROWTH 3 DAYS       CULTURE, BLOOD [[id number]]ollected:  07/21/15 1840    Order Status:  Completed Specimen:  Blood from Blood Updated:  07/24/15 0643     Special Requests: NO SPECIAL REQUESTS        Culture result: NO GROWTH 3 DAYS           IMAGING  [place]ults (most recent):    Results from Hospital Encounter encounter on 07/20/15   [facility]   Narrative Abdominal ultrasound, limited    HISTORY: Transaminitis    COMPARISON: None.    FINDINGS: The pancreas appears unremarkable on visualized proximal portion. The  mid and distal portion is obscured by bowel gas.    The liver appears normal in morphology and echotexture. No definite evidence of  focal liver mass identified. Normal direction of the flow and doppler spectrum  is demonstrated in portal vein.     The gallbladder demonstrates no gallstone, gallbladder wall thickening or  pericholecystic fluid. The common bile duct is not dilated and measures 1.7 mm.       The right kidney appears normal in size but echogenic parenchymal noted.  It  measures 9.5 cm in length. No evidence of hydronephrosis, renal calculi or focal  renal abnormality visualized. Tiny cortical cysts noted in the lower pole  measuring 9 x 9 mm. The second cystic nodule in the upper pole measures 7 x 9 x  13 mm.     The abdominal aorta is normal in caliber. The IVC is patent.          Impression IMPRESSION:    1. Unremarkable liver by ultrasound.    2. No cholelithiasis or biliary dilatation.     3. Echogenic appearing right renal cortex, suggesting medical renal disease.  Tiny cortical hypoechoic nodules in upper and the lower pole could represent  small cysts or cystic lesions,  too small for accurate evaluation.    Thank you for your referral.           CT Results (most recent):    Results from [facility] encounter on 07/20/15   CT HEAD WO CONT   Narrative EXAM: CT of the Head without contrast    INDICATION: Unsteady gait and altered mental status.    TECHNIQUE: CT of the head from the vertex to the skull base performed. No IV  contrast administered.    All CT scans at this facility are performed using dose optimization technique as  appropriate to a performed exam, to include automated exposure control,  adjustment of the mA and/or kV according to patient size (including appropriate  matching for site specific examination) or use of iterative reconstruction  technique.    COMPARISON: None.    FINDINGS: No evidence of acute intra-axial or extra-axial hemorrhage.  The  ventricles and sulci are symmetric.  No midline shift, mass effect or mass  lesion appreciated.  The gray-white junction is preserved. No evidence of an  acute infarct identified. The mastoid air cells are well aerated. The paranasal  sinuses are unremarkable. The orbits are normal. The scalp and skull are  unremarkable.         Impression IMPRESSION:  1.   No acute intracranial process identified.        XR Results (most recent):    Results from Hospital Encounter encounter on 07/20/15   XR CHEST PORT   Narrative EXAM: Chest Radiograph    INDICATION: Altered mental status. Patient on dialysis.    TECHNIQUE: AP view of the chest    COMPARISON: 07/13/2015, 01/04/2011, 01/02/2011     FINDINGS: No pneumothorax identified.  Bilateral interstitial infiltrates  are  noted. Left greater than right. No effusions identified.  The cardiac silhouette  is enlarged. This is increased.   Cephalization of the pulmonary vessels are  noted. The osseous structures are unremarkable. A left axillary stent is noted.         Impression Impression:  1.  Mild to moderate interstitial pulmonary edema.           EKG Results     Procedure 720 Value Units Date/Time    SCANNED CARDIAC RHYTHM STRIP [[id number]] Collected:  07/24/15 0722    Order Status:  Completed Updated:  07/24/15 0833    SCANNED CARDIAC RHYTHM STRIP [[id number]] Collected:  07/24/15 0720    Order Status:  Completed Updated:  07/24/15 0721    SCANNED CARDIAC RHYTHM STRIP [[id number]] Collected:  07/23/15 0915    Order Status:  Completed Updated:  07/23/15 0931    EKG, 12 LEAD, INITIAL [[id number]] Collected:  07/20/15 1533    Order Status:  Completed Updated:  07/21/15 0804     Ventricular Rate 124 BPM      Atrial Rate 124 BPM      QRS Duration 160 ms      Q-T Interval 294 ms      QTC Calculation (Bezet) 422 ms      Calculated R Axis -64 degrees      Calculated T Axis 63 degrees      Diagnosis --      Accelerated Junctional rhythm  Left axis deviation  Right bundle branch block  Septal infarct , age undetermined  tall t wave possible electrolyte abnormalities.  Abnormal ECG  When compared with ECG of 13-Jul-2015 12:50,  Current undetermined rhythm precludes rhythm comparison, needs review  Right bundle branch  block is now present  Septal infarct is now present  Confirmed by Elita Quick ([id number]) on 07/21/2015 8:04:36 AM            Discharge Medications:     Current Discharge Medication List      START taking these medications    Details   calcium acetate (PHOSLO) 667 mg cap Take 4 Caps by mouth three (3) times daily (with meals).  Qty: 360 Cap, Refills: 0      folic acid (FOLVITE) 1 mg tablet Take 1 Tab by mouth daily.  Qty: 30 Tab, Refills: 0         CONTINUE these medications which have NOT CHANGED    Details    hydrALAZINE (APRESOLINE) 25 mg tablet Take 1 Tab by mouth three (3) times daily.  Qty: 90 Tab, Refills: 0      metoprolol tartrate (LOPRESSOR) 50 mg tablet Take 1 Tab by mouth two (2) times a day.  Qty: 60 Tab, Refills: 0      amLODIPine (NORVASC) 10 mg tablet Take 1 Tab by mouth daily.  Qty: 30 Tab, Refills: 0      albuterol (PROVENTIL HFA, VENTOLIN HFA, PROAIR HFA) 90 mcg/actuation inhaler Take 1 Puff by inhalation as needed.  Qty: 1 Inhaler, Refills: 0      pantoprazole (PROTONIX) 40 mg tablet Take 40 mg by mouth daily.      Associated Diagnoses: Mallory - Weiss tear      citalopram (CELEXA) 20 mg tablet Take 1 Tab by mouth daily.  Qty: 30 Tab, Refills: 4    Associated Diagnoses: Depression      thiamine (B-1) 100 mg tablet Take 1 Tab by mouth daily.  Qty: 30 Tab, Refills: 5      multivitamin-iron, hematinic, (CEROVITE ADVANCED FORMULA) 27-0.4 mg Tab tablet Take 1 Tab by mouth daily.  Qty: 30 Tab, Refills: 5             Activity: as tolerated.     Diet: renal, cardiac    Wound Care: none needed.     Follow-up:   1. Dr Kellie Simmering in 3 months regarding hep c therapy  2. outpt unit chair [place], [address]. MWF 1:30 PM, first HD 07/26/15    Minutes spent on discharge: greater than 30

## 2015-07-27 LAB — CULTURE, BLOOD
Culture result:: NO GROWTH
Culture result:: NO GROWTH

## 2015-09-16 ENCOUNTER — Emergency Department: Admit: 2015-09-16 | Payer: MEDICAID

## 2015-09-16 ENCOUNTER — Inpatient Hospital Stay
Admit: 2015-09-16 | Discharge: 2015-09-18 | Disposition: A | Discharge status: Home / Self Care | Payer: MEDICAID | Attending: Emergency Medicine | Admitting: Emergency Medicine

## 2015-09-16 DIAGNOSIS — E8779 Other fluid overload: Secondary | ICD-10-CM

## 2015-09-16 LAB — METABOLIC PANEL, BASIC
Anion gap: 18 mmol/L (ref 3.0–18)
BUN/Creatinine ratio: 6 — ABNORMAL LOW (ref 12–20)
BUN: 99 MG/DL — ABNORMAL HIGH (ref 7.0–18)
CO2: 20 mmol/L — ABNORMAL LOW (ref 21–32)
Calcium: 7.4 MG/DL — ABNORMAL LOW (ref 8.5–10.1)
Chloride: 98 mmol/L — ABNORMAL LOW (ref 100–108)
Creatinine: 17.2 MG/DL — ABNORMAL HIGH (ref 0.6–1.3)
GFR est AA: 4 mL/min/{1.73_m2} — ABNORMAL LOW (ref >60)
GFR est non-AA: 3 mL/min/{1.73_m2} — ABNORMAL LOW (ref >60)
Glucose: 90 mg/dL (ref 74–99)
Potassium: 4.4 mmol/L (ref 3.5–5.5)
Sodium: 136 mmol/L (ref 136–145)

## 2015-09-16 LAB — CARDIAC PANEL,(CK, CKMB & TROPONIN)
CK - MB: 2.1 ng/ml (ref 0.5–3.6)
CK-MB Index: 1.5 % (ref 0.0–4.0)
CK: 142 U/L (ref 39–308)
Troponin-I, QT: 0.02 NG/ML (ref 0.0–0.045)

## 2015-09-16 LAB — CBC WITH AUTOMATED DIFF
ABS. BASOPHILS: 0 10*3/uL (ref 0.0–0.1)
ABS. EOSINOPHILS: 0.4 10*3/uL (ref 0.0–0.4)
ABS. LYMPHOCYTES: 1 10*3/uL (ref 0.9–3.6)
ABS. MONOCYTES: 0.6 10*3/uL (ref 0.05–1.2)
ABS. NEUTROPHILS: 3.2 10*3/uL (ref 1.8–8.0)
BASOPHILS: 0 % (ref 0–2)
EOSINOPHILS: 8 % — ABNORMAL HIGH (ref 0–5)
HCT: 25.4 % — ABNORMAL LOW (ref 36.0–48.0)
HGB: 8.4 g/dL — ABNORMAL LOW (ref 13.0–16.0)
LYMPHOCYTES: 19 % — ABNORMAL LOW (ref 21–52)
MCH: 26.8 PG (ref 24.0–34.0)
MCHC: 33.1 g/dL (ref 31.0–37.0)
MCV: 81.2 FL (ref 74.0–97.0)
MONOCYTES: 11 % — ABNORMAL HIGH (ref 3–10)
MPV: 8.6 FL — ABNORMAL LOW (ref 9.2–11.8)
NEUTROPHILS: 62 % (ref 40–73)
PLATELET: 134 10*3/uL — ABNORMAL LOW (ref 135–420)
RBC: 3.13 M/uL — ABNORMAL LOW (ref 4.70–5.50)
RDW: 16.4 % — ABNORMAL HIGH (ref 11.6–14.5)
WBC: 5.2 10*3/uL (ref 4.6–13.2)

## 2015-09-16 LAB — HEPATIC FUNCTION PANEL
A-G Ratio: 0.6 — ABNORMAL LOW (ref 0.8–1.7)
ALT (SGPT): 13 U/L — ABNORMAL LOW (ref 16–61)
AST (SGOT): 19 U/L (ref 15–37)
Albumin: 2.7 g/dL — ABNORMAL LOW (ref 3.4–5.0)
Alk. phosphatase: 70 U/L (ref 45–117)
Bilirubin, direct: 0.3 MG/DL — ABNORMAL HIGH (ref 0.0–0.2)
Bilirubin, total: 0.6 MG/DL (ref 0.2–1.0)
Globulin: 4.6 g/dL — ABNORMAL HIGH (ref 2.0–4.0)
Protein, total: 7.3 g/dL (ref 6.4–8.2)

## 2015-09-16 LAB — PROTHROMBIN TIME + INR
INR: 1 (ref 0.8–1.2)
Prothrombin time: 12.8 s (ref 11.5–15.2)

## 2015-09-16 LAB — PHOSPHORUS: Phosphorus: 8.8 MG/DL — ABNORMAL HIGH (ref 2.5–4.9)

## 2015-09-16 LAB — PTT: aPTT: 39.4 s — ABNORMAL HIGH (ref 23.0–36.4)

## 2015-09-16 LAB — MAGNESIUM: Magnesium: 2.5 mg/dL — ABNORMAL HIGH (ref 1.8–2.4)

## 2015-09-16 MED ORDER — METOPROLOL TARTRATE 50 MG TAB
50 mg | ORAL | Status: AC
Start: 2015-09-16 — End: 2015-09-16
  Administered 2015-09-16: 22:00:00 via ORAL

## 2015-09-16 MED ORDER — HYDRALAZINE 25 MG TAB
25 mg | ORAL | Status: AC
Start: 2015-09-16 — End: 2015-09-16
  Administered 2015-09-16: 22:00:00 via ORAL

## 2015-09-16 MED ORDER — AMLODIPINE 10 MG TAB
10 mg | ORAL | Status: AC
Start: 2015-09-16 — End: 2015-09-16
  Administered 2015-09-16: 22:00:00 via ORAL

## 2015-09-16 MED ORDER — PANTOPRAZOLE 40 MG IV SOLR
40 mg | INTRAVENOUS | Status: AC
Start: 2015-09-16 — End: 2015-09-16
  Administered 2015-09-17: 01:00:00 via INTRAVENOUS

## 2015-09-16 MED FILL — METOPROLOL TARTRATE 50 MG TAB: 50 mg | ORAL | Qty: 1

## 2015-09-16 MED FILL — HYDRALAZINE 25 MG TAB: 25 mg | ORAL | Qty: 1

## 2015-09-16 MED FILL — AMLODIPINE 10 MG TAB: 10 mg | ORAL | Qty: 1

## 2015-09-16 NOTE — ED Notes (Signed)
TV dinner provided.

## 2015-09-16 NOTE — Other (Signed)
ACUTE HEMODIALYSIS FLOW SHEET    HEMODIALYSIS ORDERS: Physician:   Dr. Regan Lemming      Dialyzer:   Clear        Duration:  2       BFR:   350     DFR:   800      Dialysate:  K+ 2             Ca+  2-- 2.5Ca not available      Weight:  77.1  kg    Bed Scale      Unable to Obtain       UF Goal:    3000       Heparin   Bolus      Units     Hourly       Units     None   Pre BP:   174/115     Pulse:    72              Temperature:   97.0      Tx: NS           ml/Bolus  Other         N/A   Labs: Pre   Hep stat     Post:         N/A   Additional Orders:            Time Out/Safety Check   [facility] Consent Verified     CATHETER ACCESS: N/A   Right   Left   IJ     Fem    First use X-ray verified     Tunnel                 Non Tunneled   No S/S infection  Redness  Drainage Cultured Swelling Pain   Medical Aseptic Prep Utilized   Dressing Changed   Biopatch  Date:       Clotted   Patent   Flows: Good  Poor  Reversed   If access problem, Dr. notified: Yes    N/A  Date:           GRAFT/FISTULA ACCESS:  N/A     Right     Left     UE     LE   AVG   AVF        Buttonhole    Medical Aseptic Prep Utilized   No S/S infection  Redness  Drainage Cultured Swelling Pain  Bruit:    Strong     Weak       Thrill :    Strong     Weak     Needle Gauge: 15          Length:   1     If access problem, Dr. notified: Yes     N/A  Date:             GENERAL ASSESSMENT:   LUNGS:  Sat%            Clear   Coarse   Crackles   Wheezing         Diminished     Location : RRL   LLL    RUL  LUL   Cough: Productive  Dry  N/A   Respirations:  Easy  Labored   Therapy:  RA  NC         l/min    Mask: NRB Venti  O2%                  Ventilator  Intubated  Trach   CARDIAC: Regular       Irregular    Pericardial Rub   JVD          Monitored   N/A  Rhythm:         EDEMA:  None  Generalized   Pitting  1     2     3     4                   Facial   Pedal    UE   LE   SKIN:    Warm   Hot      Cold    Dry      Pale    Diaphoretic                    Flushed   Jaundiced   Cyanotic   Rash   Weeping   LOC:     Alert      Oriented:     Person      Place  Time                Confused   Lethargic   Medicated   Non-responsive     GI / ABDOMEN:   Flat     Distended     Soft     Firm     Obese                              Diarrhea   Bowel Sounds   Nausea   Vomiting      GU / URINE ASSESSMENT:   Voiding    Oliguria   Anuria     Foley      Incontinent      Incontinent Brief        Bathroom Privileges     PAIN:  0=None 1  2   3   4   5   6   7   8   9   10             Scale 0-10  Action/Follow Up:         MOBILITY:   Amb     Amb/Assist     Bed     Wheelchair   Stretcher      All Vitals and Treatment Details on Attached Flowsheet       [facility]ospital:  [facility]            Room #  ER         1st Time Acute   Stat   Routine   Urgent      Acute Room    Bedside   ICU/CCU   ER   Isolation Precautions:   Dialysis    Airborne    Contact     Reverse   Special Considerations:          Blood Consent Verified N/A     ALLERGIES:    NKA   See chart        Code Status:   Full Code   DNR   Other  Not on file         HBsAg ONLY: Date Drawn  Negative Positive Unknown   HBsAb: Date             Susceptible    Immune10 Not Drawn      Current Labs:    Date of Labs:                Today      (Cut and Past Labs Here)                                                                                                                              DIET:   Renal     Other      NPO       Diabetic      PRIMARY NURSE REPORT: First initial/Last name/Title      Pre Dialysis:  K. Little,   rn   Time:           EDUCATION:    Patient  Other         Knowledge Basis: None Minimal Substantial    Access Care      S&S of infection      Fluid Management     K+     Procedural    Albumin      Medications      Tx Options      Transplant      Diet      Other   Teaching Tools:   Explain   Demo   Handouts  Video   Inappropriate due to             RO/HEMODIALYSIS MACHINE SAFETY CHECKS ??? Before each treatment:     Machine Number: 4  Machine/RO #             Alarm Test: Pass           Other:          RO/Machine Log Complete   Dialysate: pH    7.4    Temp    36.5     Extracorporeal Circuit Tested for integrity   Conductivity: Meter  14.0     HD Machine 14.3          CHLORINE TESTING-Before each treatment and every 4 hours    Total Chlorine:  less than 0.1 ppm  Time:  2025   4 Hr Check Time:         (if greater than 0.1 ppm from Primary then every 30 minutes from Secondary)     TREATMENT INITIATION ??? with Dialysis Precautions:    All Connections Secured                  Saline Line Double Clamped    Venous Parameters Set                   Arterial Parameters Set     Prime Given  ml  250  Air Foam Detector Engaged      Treatment Initiation Note:   Tx initiated using 15G needles to LUE AVF sites without difficulty. No distress noted          Medication Dose Volume Route Initials Dialyzer Cleared:  Good  Fair   Poor    Blood processed:   37.9   L  UF Removed   3000   Ml    Post Wt:    POst BP: 171/115         Pulse:       72 Temperature:97.0                                   Post Tx Vascular Access: AVF/AVG: Bleeding stopped Art      min. Ven.      Min                                   Catheter: Locking solution: Heparin 1:1000 Art.    1.9  Ven.  1.9                                     Post Assessment:                                    Skin:     Warm   Dry  Diaphoretic      Flushed   Pale  Cyanotic   [facility] Signatures Title Initials  Time Lungs:  Clear     Course   Crackles   Wheezing       Cardiac:  Regular    Irregular    Monitor   N/A  Rhythm:           Edema:   None     General      Facial    Pedal     UE     LE       Pain: 0=None  1  2   3  4   5   6   7   8   9   10      Did Physician make rounds during treatment:     Yes       No    Post Treatment Note: Tolerated HD tx well. No distress noted post HD tx               POST TREATMENT PRIMARY NURSE HANDOFF REPORT:   First initial/Last name/Title  Post Dialysis:     K. Calbart, RN     Time:            Abbreviations: AVG-arterial venous graft, AVF-arterial venous fistula, IJ-Internal Jugular, Subcl-Subclavian, Fem-Femoral, Tx-treatment, AP/HR-apical heart rate, DFR-dialysate flow rate, BFR-blood flow rate, AP-arterial pressure, VP-venous pressure, UF-ultrafiltrate, TMP-transmembrane pressure, Ven-Venous, Art-Arterial, RO-Reverse Osmosis

## 2015-09-16 NOTE — ED Triage Notes (Signed)
Last dialysis was last Wednesday. haven't gone to dialysis since then. States hes sob and 'not feeling right'

## 2015-09-16 NOTE — ED Notes (Cosign Needed)
I performed a brief evaluation, including history and physical, of the patient here in triage and I have determined that pt will need further treatment and evaluation from the main side ER physician.  I have placed initial orders to help in expediting patients care.   Patient supposed to get dialysis every MWF, has not had it since last Friday.  Is supposedly visiting from [facility].  Complains of SOB, dizziness.  Noted hypertension in triage  September 16, 2015 at 2:01 PM - Gifford Shave, PA        Visit Vitals   ??? BP (!) 212/122 (BP 1 Location: Left arm, BP Patient Position: At rest)   ??? Pulse 99   ??? Temp 98.2 ??F (36.8 ??C)   ??? Resp 22   ??? Ht 6' (1.829 m)   ??? Wt 77.1 kg (170 lb)   ??? SpO2 99%   ??? BMI 23.06 kg/m2

## 2015-09-16 NOTE — ED Notes (Signed)
Bedside report given to Penni Bombard, RN, PT resting in bed watching TV, safety intact

## 2015-09-16 NOTE — ED Notes (Signed)
Bedside shift report received from  [facility].  Introduced self to pt as Radio producer. Pt encouraged to feel free voice all concerns and/ or questions they may have at any time.  Pt a/o x 4  Follows commands. Pt c/o  Slight SOB , will continue to monitor.   Call beach in reach bed low, locked, with side rails up.   No s/s of distress noted.

## 2015-09-16 NOTE — ED Notes (Signed)
PT c/o shortness of breath and states "I know its time I need dialysis", PT normally receives dialysis MWF but has not gone since last 12-31-2022 due to death in the family, Ax4, NAD, safety intact, will continue to monitor

## 2015-09-16 NOTE — Progress Notes (Signed)
Called from er,dr edwards  Pt with hx of esrd,fluid overload ,non compliance ,well known to me  Will do brief dialysis tonight for fluid overload  Regular dialysis tomorrow

## 2015-09-16 NOTE — Progress Notes (Signed)
Albuterol nebs 0.083 % was therapeutically interchanged for Albuterol MDI per the P&T Committee approved Production assistant, radio.    Robert Pierce, [facility], Pharmacist  09/16/2015 9:32 PM

## 2015-09-16 NOTE — H&P (Signed)
[facility]  ROUTINE H AND PS    Name:  Robert Pierce, Robert Pierce  MR#:  [id number]  DOB:  [birth year]  Account #:  [id number]  Date of Adm:  09/16/2015      CHIEF COMPLAINT:  The chief complaint for the patient is shortness  of breath.    HISTORY OF PRESENT ILLNESS:  This is a 45 year old African  American male who presented to the ER with complaints of worsening  shortness of breath and worsening lower extremity edema.  The  patient states that he is from out of town and is visiting here.  His stay  got extended, and he is not able to get his dialysis anymore.  The last  dialysis was eight days ago.  The patient complains of dyspnea on  exertion and some orthopnea.  The patient denies any chest pain and  any palpitations.  No history of any nausea or vomiting.  No history of  any fever or chills.  No history of any cough, headaches, numbness, or  focal weakness.  No history of any abdominal pain.  No history of any  diarrhea or rectal bleeding.  No history of any urinary symptoms.  The  patient does complain of some lower extremity swelling.  The patient  denies any black or tar-colored stools.  The patient denies any blood in  the stool.    PAST MEDICAL HISTORY:  Significant for a history of end-stage renal  disease, hypertension, hepatitis C, history of gastrointestinal bleed in  the past, history of alcohol abuse in the past, and a history of diastolic  congestive heart failure in the past, with an EF of 55% to 60%.    PAST SURGICAL HISTORY:  The patient has had dialysis access  related surgeries.    ALLERGIES:  NO KNOWN DRUG ALLERGIES.    SOCIAL HISTORY:  The patient smokes cigarettes on a daily basis.  The patient denies any current alcohol use.  The patient denies any  illicit drug use.    FAMILY HISTORY:  High blood pressure runs in the family.    REVIEW OF SYSTEMS:  Apart from the complaints mentioned above,  a complete review of systems was done and is negative.    MEDICATIONS   Prior to admission medications as listed in the chart include:  1. PhosLo.  2. Folvite.  3. Hydralazine 25 mg p.o. 3 times daily.  4. Lopressor 50 mg p.o. twice daily.  5. Norvasc 10 mg p.o. daily.  6. Albuterol 1 puff inhaled as needed.  7. Protonix 40 mg p.o. daily.  8. Celexa 20 mg p.o. daily.  9. Thiamine 100 mg p.o. daily.    PHYSICAL EXAMINATION  VITAL SIGNS:  His vital signs today show a temperature of 97.1, pulse  rate 71, blood pressure 188/121, respiratory rate of 22, and oxygen  saturation of 98% on room air.  GENERAL:  On general examination, this is a 45 year old male sitting  in bed in no apparent distress.  HEAD:  On examination of the head, it is normocephalic and  atraumatic.  EYES:  Pupils are reactive to light.  EARS, NOSE, AND THROAT:  No ear or nasal discharge is seen.  Mucous membranes are moist.  NECK:  Supple.  No JVD.  No carotid bruit.  No thyromegaly.  No  lymphadenopathy.  LUNGS:  Clear to auscultation bilaterally.  Some decreased breath  sounds are noted at the bilateral bases.  No rales, no rhonchi, and no  wheezing is heard.  HEART:  S1 and S2 are heard, regular rate and rhythm.  ABDOMEN:  Soft.  Bowel sounds are positive.  Nontender and  nondistended.  EXTREMITIES:  With 1+ lower extremity edema noted.  Pulses are 1+  and bilaterally equal.  NEUROLOGIC:  The patient is awake, alert, and oriented x3.  No focal  neurological deficit is identified.    LABORATORY DATA:  EKG today showed, as per my reading, a sinus  rhythm at the rate of 88 beats per minute.  Nonspecific ST-T changes  were noted.  Chest x-ray, as per my reading, showed a small right  pleural effusion.  No infiltrate and no effusion was noted.  WBC count  was 5.2, hemoglobin 8.4, hematocrit 25.4, and platelet count 134.  INR  1.0.  Sodium 136, potassium 4.4, chloride of 98, CO2 20, glucose of  98, BUN of 99, creatinine 17.20, phosphorus of 8.8, and calcium 7.4.  Troponin, the first set, was negative.    IMPRESSION   1. Likely volume overload in the setting of noncompliance with  hemodialysis.  2. End-stage renal disease.  3. Uncontrolled hypertension.  4. Diastolic dysfunction, without any evidence of acute congestive  heart failure exacerbation:  Ejection fraction of 55%.  5. Anemia with heme-positive stools in the setting of a prior history of a  gastrointestinal bleed.  6. Ongoing tobacco abuse.    PLAN:  The patient will be admitted to telemetry.  Nephrology has been  consulted, with plans to hemodialyze the patient.  Intake and output will  be monitored.  Hemoglobin and hematocrit will be monitored.  The  patient will be continued on Protonix.  Gastroenterology has been  consulted.  Other pre-admission medications will be continued.  Blood  pressure medications will be continued.  The rest is dependent upon  the patient's further hospital course.  The plan of care was discussed  with the patient, and he verbalized understanding and agreed.  The  patient wishes to be a FULL CODE.  The patient does not have a PCP  at this time.        Jonah Blue, MD    VT / MC  D:  09/16/2015   21:43  T:  09/16/2015   22:19  Job #:  [id number]

## 2015-09-16 NOTE — ED Provider Notes (Signed)
HPI Comments: 4:19 PM Robert Pierce is a 45 y.o. male with a history of HTN, Heart Failure and GI bleed who presents to the emergency department c/o SOB and increased bilateral leg swelling. Pt states that he has not been to dialysis is over a week (missed 3 sessions). He has had SOB on exertion and some orthopnea. Pt states that he is from [place] and traveled here to deal with family emergencies. He states that he was getting dialyzed while here, but since he was not supposed to be in the area this long he was released from the facility locally. Pt states that he feels "heavy". No other complaints or concerns were noted at this time.         The history is provided by the patient.        Past Medical History:   Diagnosis Date   ??? Alcohol abuse    ??? Diastolic heart failure ([place])    ??? Gastrointestinal bleeding    ??? Hepatitis C    ??? High blood pressure 01/13/2011   ??? History of echocardiogram 01/03/2011     EF 55-60%.  Gr 2 DDfx.  Increased RA pressure.  No R-L shunt.  Mild RAE.     ??? LE venous duplex 01/09/2011     No deep or superficial venous thrombosis in bilateral lower extremities.     ??? Migraine headache 01/13/2011       No past surgical history on file.      No family history on file.    Social History     Social History   ??? Marital status: SINGLE     Spouse name: N/A   ??? Number of children: N/A   ??? Years of education: N/A     Occupational History   ??? Not on file.     Social History Main Topics   ??? Smoking status: Current Every Day Smoker     Types: Cigarettes   ??? Smokeless tobacco: Never Used   ??? Alcohol use No   ??? Drug use: No   ??? Sexual activity: Yes     Partners: Female     Other Topics Concern   ??? Not on file     Social History Narrative   ??? No narrative on file         ALLERGIES: Review of patient's allergies indicates no known allergies.    Review of Systems   Constitutional: Negative.  Negative for chills and fever.   HENT: Negative.  Negative for congestion and sneezing.     Eyes: Negative.  Negative for visual disturbance.   Respiratory: Positive for shortness of breath. Negative for cough and chest tightness.    Cardiovascular: Positive for leg swelling. Negative for chest pain.   Gastrointestinal: Negative.  Negative for abdominal pain, diarrhea, nausea and vomiting.   Genitourinary: Negative.  Negative for difficulty urinating and dysuria.   Musculoskeletal: Negative.  Negative for back pain and myalgias.   Skin: Negative.  Negative for rash and wound.   Neurological: Negative.  Negative for dizziness, speech difficulty, weakness, light-headedness and headaches.   Psychiatric/Behavioral: Negative.  Negative for self-injury.   All other systems reviewed and are negative.      Vitals:    09/16/15 1730 09/16/15 1815 09/16/15 1830 09/16/15 1845   BP: (!) 187/117 (!) 176/110 (!) 187/110 (!) 163/112   Pulse:       Resp:       Temp:  SpO2: 99% 99% 97% 99%   Weight:       Height:                Physical Exam   Constitutional: He is oriented to person, place, and time. He appears well-developed and well-nourished. No distress.   HENT:   Head: Normocephalic and atraumatic.   Eyes: Conjunctivae and EOM are normal. Pupils are equal, round, and reactive to light. No scleral icterus.   Neck: Normal range of motion. Neck supple. JVD present. No thyromegaly present.   Cardiovascular: Normal rate, regular rhythm, S1 normal, S2 normal and normal heart sounds.  Exam reveals no gallop and no friction rub.    No murmur heard.  Pulmonary/Chest: Effort normal. No accessory muscle usage. No respiratory distress. He exhibits no tenderness.   Crackles in lung bases bilaterally.    Abdominal: Soft. Normal appearance and bowel sounds are normal. He exhibits no distension. There is no tenderness. There is no rigidity, no rebound and no guarding.   Genitourinary:   Genitourinary Comments: Rectal exam: Normal brown stool, weakly Heme +    Musculoskeletal: Normal range of motion. He exhibits edema. He exhibits no tenderness.   +1 pitting edema in LE   Neurological: He is alert and oriented to person, place, and time. He has normal strength. No cranial nerve deficit or sensory deficit. Coordination normal.   Skin: Skin is warm, dry and intact. No rash noted.   Psychiatric: He has a normal mood and affect. His speech is normal and behavior is normal.   Nursing note and vitals reviewed.       MDM  Number of Diagnoses or Management Options  Essential hypertension:   Gastrointestinal hemorrhage, unspecified gastrointestinal hemorrhage type:   Hypervolemia, unspecified hypervolemia type:   Metabolic acidosis:   Pleural effusion, right:   Diagnosis management comments: Robert Pierce is a 45 y.o. Male coming in with SOB, DOE and fluid overload after missing dialysis. K is ok, slightly acidotic and new small pleural effusion. Hbg noted to be down from 12.0 > 8.4. Noted heme positive, but no melena. Will admit for dialysis and GI bleed.    ED Course       Procedures    Vitals:  Patient Vitals for the past 12 hrs:   Temp Pulse Resp BP SpO2   09/16/15 1845 - - - (!) 163/112 99 %   09/16/15 1830 - - - (!) 187/110 97 %   09/16/15 1815 - - - (!) 176/110 99 %   09/16/15 1730 - - - (!) 187/117 99 %   09/16/15 1700 - - - (!) 186/109 92 %   09/16/15 1357 98.2 ??F (36.8 ??C) 99 22 (!) 212/122 99 %   99 %. Percentage is within normal limits.       Medications ordered:   Medications   pantoprazole (PROTONIX) injection 40 mg (not administered)   amLODIPine (NORVASC) tablet 10 mg (10 mg Oral Given 09/16/15 1653)   hydrALAZINE (APRESOLINE) tablet 25 mg (25 mg Oral Given 09/16/15 1653)   metoprolol tartrate (LOPRESSOR) tablet 50 mg (50 mg Oral Given 09/16/15 1653)         Lab findings:  Recent Results (from the past 12 hour(s))   CBC WITH AUTOMATED DIFF    Collection Time: 09/16/15  3:53 PM   Result Value Ref Range    WBC 5.2 4.6 - 13.2 K/uL    RBC 3.13 (L) 4.70 - 5.50 M/uL     HGB  8.4 (L) 13.0 - 16.0 g/dL    HCT 25.4 (L) 36.0 - 48.0 %    MCV 81.2 74.0 - 97.0 FL    MCH 26.8 24.0 - 34.0 PG    MCHC 33.1 31.0 - 37.0 g/dL    RDW 16.4 (H) 11.6 - 14.5 %    PLATELET 134 (L) 135 - 420 K/uL    MPV 8.6 (L) 9.2 - 11.8 FL    NEUTROPHILS 62 40 - 73 %    LYMPHOCYTES 19 (L) 21 - 52 %    MONOCYTES 11 (H) 3 - 10 %    EOSINOPHILS 8 (H) 0 - 5 %    BASOPHILS 0 0 - 2 %    ABS. NEUTROPHILS 3.2 1.8 - 8.0 K/UL    ABS. LYMPHOCYTES 1.0 0.9 - 3.6 K/UL    ABS. MONOCYTES 0.6 0.05 - 1.2 K/UL    ABS. EOSINOPHILS 0.4 0.0 - 0.4 K/UL    ABS. BASOPHILS 0.0 0.0 - 0.1 K/UL    DF AUTOMATED     METABOLIC PANEL, BASIC    Collection Time: 09/16/15  3:53 PM   Result Value Ref Range    Sodium 136 136 - 145 mmol/L    Potassium 4.4 3.5 - 5.5 mmol/L    Chloride 98 (L) 100 - 108 mmol/L    CO2 20 (L) 21 - 32 mmol/L    Anion gap 18 3.0 - 18 mmol/L    Glucose 90 74 - 99 mg/dL    BUN 99 (H) 7.0 - 18 MG/DL    Creatinine 17.20 (H) 0.6 - 1.3 MG/DL    BUN/Creatinine ratio 6 (L) 12 - 20      GFR est AA 4 (L) >60 ml/min/1.72m    GFR est non-AA 3 (L) >60 ml/min/1.721m   Calcium 7.4 (L) 8.5 - 10.1 MG/DL   PROTHROMBIN TIME + INR    Collection Time: 09/16/15  3:53 PM   Result Value Ref Range    Prothrombin time 12.8 11.5 - 15.2 sec    INR 1.0 0.8 - 1.2     PTT    Collection Time: 09/16/15  3:53 PM   Result Value Ref Range    aPTT 39.4 (H) 23.0 - 36.4 SEC   CARDIAC PANEL,(CK, CKMB & TROPONIN)    Collection Time: 09/16/15  3:53 PM   Result Value Ref Range    CK 142 39 - 308 U/L    CK - MB 2.1 0.5 - 3.6 ng/ml    CK-MB Index 1.5 0.0 - 4.0 %    Troponin-I, Qt. 0.02 0.0 - 0.045 NG/ML   MAGNESIUM    Collection Time: 09/16/15  3:53 PM   Result Value Ref Range    Magnesium 2.5 (H) 1.8 - 2.4 mg/dL   HEPATIC FUNCTION PANEL    Collection Time: 09/16/15  3:53 PM   Result Value Ref Range    Protein, total 7.3 6.4 - 8.2 g/dL    Albumin 2.7 (L) 3.4 - 5.0 g/dL    Globulin 4.6 (H) 2.0 - 4.0 g/dL    A-G Ratio 0.6 (L) 0.8 - 1.7       Bilirubin, total 0.6 0.2 - 1.0 MG/DL    Bilirubin, direct 0.3 (H) 0.0 - 0.2 MG/DL    Alk. phosphatase 70 45 - 117 U/L    AST (SGOT) 19 15 - 37 U/L    ALT (SGPT) 13 (L) 16 - 61 U/L   PHOSPHORUS    Collection Time: 09/16/15  4:15 PM   Result Value Ref Range    Phosphorus 8.8 (H) 2.5 - 4.9 MG/DL       EKG interpretation by ED Physician:   4:18 PM: Sinus rhythm at a rate of 88. Flattened T waves laterally no other acute changes.     X-Ray, CT or other radiology findings or impressions:  XR CHEST PORT   Final Result:    IMPRESSION:  ??  New small right pleural effusion per radiology         Progress notes, Consult notes or additional Procedure notes:  5:34 PM Consult:  Discussed care with Dr. Cristina Gong, nephrology Standard discussion; including history of patient???s chief complaint, available diagnostic results, and treatment course. He agrees to dialyze the patient.      Consult:  Discussed care with Dr. Marta Antu, hospitalist. Standard discussion; including history of patient???s chief complaint, available diagnostic results, and treatment course. He agrees to admit to tele bed he requesting IV Protonix and to call GI .    Consult:  Discussed care with Dr. Daivd Council, gastroenterology. Standard discussion; including history of patient???s chief complaint, available diagnostic results, and treatment course. Agrees to consult on the patient.     Disposition:  Diagnosis:   1. Hypervolemia, unspecified hypervolemia type    2. Metabolic acidosis    3. Pleural effusion, right    4. Gastrointestinal hemorrhage, unspecified gastrointestinal hemorrhage type    5. Essential hypertension        Disposition: Admitted    Follow-up Information     None          Scribe Attestation:     I, Beatriz Chancellor, [place] for and in the presence of Evelena Leyden, MD September 16, 2015 at 7:11 PM     Physician Attestation:   I personally performed the services described in this documentation,  reviewed and edited the documentation which was dictated to the scribe in my presence, and it accurately records my words and actions. Evelena Leyden, MD  September 16, 2015 at 7:11 PM    Signed by: Beatriz Chancellor, Scribe September 16, 2015, 7:11 PM

## 2015-09-16 NOTE — ED Notes (Signed)
Patient returned to dialysis in stable condition. Denies new onset of SOB or other complaints at this time. Will continue to monitor patient.

## 2015-09-17 LAB — METABOLIC PANEL, COMPREHENSIVE
A-G Ratio: 0.6 — ABNORMAL LOW (ref 0.8–1.7)
ALT (SGPT): 11 U/L — ABNORMAL LOW (ref 16–61)
AST (SGOT): 17 U/L (ref 15–37)
Albumin: 2.4 g/dL — ABNORMAL LOW (ref 3.4–5.0)
Alk. phosphatase: 61 U/L (ref 45–117)
Anion gap: 14 mmol/L (ref 3.0–18)
BUN/Creatinine ratio: 5 — ABNORMAL LOW (ref 12–20)
BUN: 63 MG/DL — ABNORMAL HIGH (ref 7.0–18)
Bilirubin, total: 0.5 MG/DL (ref 0.2–1.0)
CO2: 25 mmol/L (ref 21–32)
Calcium: 7 MG/DL — ABNORMAL LOW (ref 8.5–10.1)
Chloride: 100 mmol/L (ref 100–108)
Creatinine: 12.4 MG/DL — ABNORMAL HIGH (ref 0.6–1.3)
GFR est AA: 5 mL/min/{1.73_m2} — ABNORMAL LOW (ref >60)
GFR est non-AA: 4 mL/min/{1.73_m2} — ABNORMAL LOW (ref >60)
Globulin: 4.3 g/dL — ABNORMAL HIGH (ref 2.0–4.0)
Glucose: 84 mg/dL (ref 74–99)
Potassium: 3.8 mmol/L (ref 3.5–5.5)
Protein, total: 6.7 g/dL (ref 6.4–8.2)
Sodium: 139 mmol/L (ref 136–145)

## 2015-09-17 LAB — EKG, 12 LEAD, INITIAL
Atrial Rate: 88 {beats}/min
Calculated P Axis: 38 degrees
Calculated R Axis: -2 degrees
Calculated T Axis: 60 degrees
Diagnosis: NORMAL
P-R Interval: 202 ms
Q-T Interval: 388 ms
QRS Duration: 84 ms
QTC Calculation (Bezet): 469 ms
Ventricular Rate: 88 {beats}/min

## 2015-09-17 LAB — HEP B SURFACE AB
Hep B surface Ab Interp.: NEGATIVE — AB
Hepatitis B surface Ab: <3.1 m[IU]/mL — ABNORMAL LOW (ref >10.0)

## 2015-09-17 LAB — HGB & HCT
HCT: 24 % — ABNORMAL LOW (ref 36.0–48.0)
HGB: 8.1 g/dL — ABNORMAL LOW (ref 13.0–16.0)

## 2015-09-17 LAB — PTH INTACT
Calcium: 7 MG/DL — ABNORMAL LOW (ref 8.5–10.1)
PTH, Intact: 386 pg/mL — ABNORMAL HIGH (ref 14.0–72.0)

## 2015-09-17 LAB — CBC WITH AUTOMATED DIFF
ABS. BASOPHILS: 0 10*3/uL (ref 0.0–0.06)
ABS. EOSINOPHILS: 0.4 10*3/uL (ref 0.0–0.4)
ABS. LYMPHOCYTES: 1 10*3/uL (ref 0.9–3.6)
ABS. MONOCYTES: 0.4 10*3/uL (ref 0.05–1.2)
ABS. NEUTROPHILS: 2 10*3/uL (ref 1.8–8.0)
BASOPHILS: 0 % (ref 0–2)
EOSINOPHILS: 10 % — ABNORMAL HIGH (ref 0–5)
HCT: 24.8 % — ABNORMAL LOW (ref 36.0–48.0)
HGB: 8.1 g/dL — ABNORMAL LOW (ref 13.0–16.0)
LYMPHOCYTES: 27 % (ref 21–52)
MCH: 26.6 PG (ref 24.0–34.0)
MCHC: 32.7 g/dL (ref 31.0–37.0)
MCV: 81.3 FL (ref 74.0–97.0)
MONOCYTES: 11 % — ABNORMAL HIGH (ref 3–10)
MPV: 8.2 FL — ABNORMAL LOW (ref 9.2–11.8)
NEUTROPHILS: 52 % (ref 40–73)
PLATELET: 111 10*3/uL — ABNORMAL LOW (ref 135–420)
RBC: 3.05 M/uL — ABNORMAL LOW (ref 4.70–5.50)
RDW: 16.6 % — ABNORMAL HIGH (ref 11.6–14.5)
WBC: 3.8 10*3/uL — ABNORMAL LOW (ref 4.6–13.2)

## 2015-09-17 LAB — MAGNESIUM: Magnesium: 2.3 mg/dL (ref 1.8–2.4)

## 2015-09-17 LAB — IRON PROFILE
Iron % saturation: 87 %
Iron: 190 ug/dL — ABNORMAL HIGH (ref 50–175)
TIBC: 219 ug/dL — ABNORMAL LOW (ref 250–450)

## 2015-09-17 LAB — FERRITIN: Ferritin: 620 NG/ML — ABNORMAL HIGH (ref 8–388)

## 2015-09-17 LAB — HEP B SURFACE AG
Hep B surface Ag Interp.: NEGATIVE
Hepatitis B surface Ag: <0.1 Index (ref <1.00)

## 2015-09-17 LAB — TYPE & SCREEN
ABO/Rh(D): O POS
Antibody screen: NEGATIVE

## 2015-09-17 LAB — PHOSPHORUS: Phosphorus: 6 MG/DL — ABNORMAL HIGH (ref 2.5–4.9)

## 2015-09-17 LAB — PROTHROMBIN TIME + INR
INR: 1.1 (ref 0.8–1.2)
Prothrombin time: 13.6 s (ref 11.5–15.2)

## 2015-09-17 LAB — TYPE AND SCREEN
ABO/Rh: O POS
Antibody Screen: NEGATIVE

## 2015-09-17 LAB — HEPATITIS B SURFACE ANTIBODY
Hep B S Ab Interp: NEGATIVE — AB
Hep B S Ab: <3.1 m[IU]/mL — ABNORMAL LOW (ref >10.0)

## 2015-09-17 MED ORDER — CITALOPRAM 20 MG TAB
20 mg | Freq: Every day | ORAL | Status: DC
Start: 2015-09-17 — End: 2015-09-18
  Administered 2015-09-17 – 2015-09-18 (×2): via ORAL

## 2015-09-17 MED ORDER — ALBUTEROL SULFATE HFA 90 MCG/ACTUATION AEROSOL INHALER
90 mcg/actuation | RESPIRATORY_TRACT | Status: DC | PRN
Start: 2015-09-17 — End: 2015-09-16

## 2015-09-17 MED ORDER — PANTOPRAZOLE 40 MG IV SOLR
40 mg | Freq: Two times a day (BID) | INTRAVENOUS | Status: DC
Start: 2015-09-17 — End: 2015-09-18
  Administered 2015-09-18: 14:00:00 via INTRAVENOUS

## 2015-09-17 MED ORDER — AMLODIPINE 10 MG TAB
10 mg | Freq: Every day | ORAL | Status: DC
Start: 2015-09-17 — End: 2015-09-18
  Administered 2015-09-17 – 2015-09-18 (×2): via ORAL

## 2015-09-17 MED ORDER — PANTOPRAZOLE 40 MG TAB, DELAYED RELEASE
40 mg | Freq: Every day | ORAL | Status: DC
Start: 2015-09-17 — End: 2015-09-17
  Administered 2015-09-17: 14:00:00 via ORAL

## 2015-09-17 MED ORDER — THIAMINE HCL 100 MG TAB
100 mg | Freq: Every day | ORAL | Status: DC
Start: 2015-09-17 — End: 2015-09-18
  Administered 2015-09-17 – 2015-09-18 (×2): via ORAL

## 2015-09-17 MED ORDER — ALBUTEROL SULFATE 0.083 % (0.83 MG/ML) SOLN FOR INHALATION
2.5 mg /3 mL (0.083 %) | RESPIRATORY_TRACT | Status: DC | PRN
Start: 2015-09-17 — End: 2015-09-18

## 2015-09-17 MED ORDER — EPOETIN ALFA 10,000 UNIT/ML IJ SOLN
10000 unit/mL | INTRAMUSCULAR | Status: DC
Start: 2015-09-17 — End: 2015-09-18

## 2015-09-17 MED ORDER — CALCIUM ACETATE 667 MG CAP
667 mg | Freq: Three times a day (TID) | ORAL | Status: DC
Start: 2015-09-17 — End: 2015-09-18
  Administered 2015-09-17 – 2015-09-18 (×3): via ORAL

## 2015-09-17 MED ORDER — LIDOCAINE (PF) 10 MG/ML (1 %) IJ SOLN
10 mg/mL (1 %) | INTRAMUSCULAR | Status: AC
Start: 2015-09-17 — End: 2015-09-18

## 2015-09-17 MED ORDER — FOLIC ACID 1 MG TAB
1 mg | Freq: Every day | ORAL | Status: DC
Start: 2015-09-17 — End: 2015-09-18
  Administered 2015-09-17 – 2015-09-18 (×2): via ORAL

## 2015-09-17 MED ORDER — SODIUM CHLORIDE 0.9 % IV
INTRAVENOUS | Status: DC | PRN
Start: 2015-09-17 — End: 2015-09-18

## 2015-09-17 MED ORDER — METOPROLOL TARTRATE 50 MG TAB
50 mg | Freq: Two times a day (BID) | ORAL | Status: DC
Start: 2015-09-17 — End: 2015-09-18
  Administered 2015-09-17 – 2015-09-18 (×3): via ORAL

## 2015-09-17 MED ORDER — HYDRALAZINE 10 MG TAB
10 mg | Freq: Four times a day (QID) | ORAL | Status: DC | PRN
Start: 2015-09-17 — End: 2015-09-18
  Administered 2015-09-17: 05:00:00 via ORAL

## 2015-09-17 MED ORDER — HYDRALAZINE 25 MG TAB
25 mg | Freq: Three times a day (TID) | ORAL | Status: DC
Start: 2015-09-17 — End: 2015-09-18
  Administered 2015-09-17 – 2015-09-18 (×4): via ORAL

## 2015-09-17 MED ORDER — ALBUMIN, HUMAN 25 % IV
25 % | INTRAVENOUS | Status: DC | PRN
Start: 2015-09-17 — End: 2015-09-18

## 2015-09-17 MED FILL — HYDRALAZINE 10 MG TAB: 10 mg | ORAL | Qty: 1

## 2015-09-17 MED FILL — AMLODIPINE 10 MG TAB: 10 mg | ORAL | Qty: 1

## 2015-09-17 MED FILL — METOPROLOL TARTRATE 50 MG TAB: 50 mg | ORAL | Qty: 1

## 2015-09-17 MED FILL — CALCIUM ACETATE 667 MG CAP: 667 mg | ORAL | Qty: 4

## 2015-09-17 MED FILL — VITAMIN B-1 100 MG TABLET: 100 mg | ORAL | Qty: 1

## 2015-09-17 MED FILL — PROTONIX 40 MG INTRAVENOUS SOLUTION: 40 mg | INTRAVENOUS | Qty: 40

## 2015-09-17 MED FILL — LIDOCAINE (PF) 10 MG/ML (1 %) IJ SOLN: 10 mg/mL (1 %) | INTRAMUSCULAR | Qty: 5

## 2015-09-17 MED FILL — SODIUM CHLORIDE 0.9 % IV: INTRAVENOUS | Qty: 1000

## 2015-09-17 MED FILL — CITALOPRAM 20 MG TAB: 20 mg | ORAL | Qty: 1

## 2015-09-17 MED FILL — PANTOPRAZOLE 40 MG TAB, DELAYED RELEASE: 40 mg | ORAL | Qty: 1

## 2015-09-17 MED FILL — HYDRALAZINE 25 MG TAB: 25 mg | ORAL | Qty: 1

## 2015-09-17 MED FILL — FOLIC ACID 1 MG TAB: 1 mg | ORAL | Qty: 1

## 2015-09-17 NOTE — Other (Signed)
ACUTE HEMODIALYSIS FLOW SHEET    HEMODIALYSIS ORDERS: Physician: Baldemar Friday      Dialyzer: revaclear         Duration: 3 hr  BFR: 400   DFR: 800   Dialysate:  Temp *37 K+   3    Ca+  2.5 Na 140 Bicarb 30   Weight:  77.1 kg    Bed Scale      Unable to Obtain       Dry weight/UF Goal: 2000 Access AVF  Needle Gauge 15    Heparin   Bolus      Units     Hourly       Units    None     Catheter locking solution    Pre BP:   151/76    Pulse:     72     Temperature:   96.6  Respirations: 16  Tx: NS       ml/Bolus  Other         N/A   Labs: Pre        Post:         N/A   Additional Orders(medications, blood products, hypotension management):        N/A      Time Out/Safety Check   [facility] Consent Verified     CATHETER ACCESS: N/A   Right   Left   IJ     Fem    First use X-ray verified     Tunnel                 Non Tunneled   No S/S infection  Redness  Drainage Cultured Swelling Pain   Medical Aseptic Prep Utilized   Dressing Changed   Biopatch  Date:       Clotted   Patent   Flows: Good  Poor  Reversed   If access problem, Dr. notified: Yes    N/A  Date:           GRAFT/FISTULA ACCESS:  N/A     Right     Left     UE     LE   AVG   AVF        Buttonhole    Medical Aseptic Prep Utilized   No S/S infection  Redness  Drainage Cultured Swelling Pain    Bruit:    Strong     Weak       Thrill :    Strong     Weak       Needle Gauge: 15   Length: 1   If access problem, Dr. notified: Yes     N/A  Date:        Please describe access if present and not used:       GENERAL ASSESSMENT:    LUNGS:  Rate  SaO2%         N/A     Clear   Coarse   Crackles   Wheezing         Diminished     Location : RLL   LLL    RUL  LUL   Cough: Productive  Dry  N/A   Respirations:  Easy  Labored   Therapy:  RA  NC  l/min    Mask: NRB Venti       O2%                  Ventilator  Intubated   [employer]  BiPaP   CARDIAC: Regular       Irregular    Pericardial Rub   JVD          Monitored   Bedside   Remotely monitored  N/A  Rhythm:     EDEMA:  None  Generalized   Pitting  1     2     3     4                   Facial   Pedal    UE   LE   SKIN:    Warm   Hot      Cold    Dry      Pale    Diaphoretic                   Flushed   Jaundiced   Cyanotic   Rash   Weeping   LOC:     Alert      Oriented:     Person      Place  Time                Confused   Lethargic   Medicated   Non-responsive     GI / ABDOMEN:   Flat     Distended     Soft     Firm     Obese                              Diarrhea   Bowel Sounds   Nausea   Vomiting      GU / URINE ASSESSMENT: Voiding    Oliguria   Anuria     Foley      Incontinent      Incontinent Brief        Bathroom Privileges     PAIN:  0 1  2   3   4   5   6   7   8   9   10             Scale 0-10  Action/Follow Up:    MOBILITY:   Amb     Amb/Assist     Bed     Wheelchair   Stretcher      All Vitals and Treatment Details on Attached Flowsheet       Hospital: [facility]          Room # ED18      1st Time Acute   Stat   Routine   Urgent      Acute Room    Bedside   ICU/CCU   ER   Isolation Precautions:   Dialysis    Airborne    Contact     Reverse   Special Considerations:          Blood Consent Verified N/A     ALLERGIES:    NKA          Code Status:   Full Code   DNR   Other           HBsAg ONLY: Date Drawn 09/16/15         Negative Positive Unknown   HBsAb: Date 09/16/15     Susceptible    Immune10 Not Drawn   Drawn     Current Labs:    Date of Labs:  Today         Cut and paste current labs here.                                                                                                                                  DIET:   Renal     Other      NPO       Diabetic      PRIMARY NURSE REPORT: First initial/Last name/Title      Pre Dialysis: Ander Slade, RN    Time: 1430      EDUCATION:     Patient  Other         Knowledge Basis: None Minimal  Substantial   Barriers to learning  N/A    Access Care      S&S of infection      Fluid Management     K+     Procedural     Albumin      Medications      Tx Options      Transplant      Diet      Other   Teaching Tools:   Explain   Demo   Handouts  Video  Patient response:    Verbalized understanding   Teach back   Return demonstration  Requires follow up   Inappropriate due to            RO/HEMODIALYSIS MACHINE SAFETY CHECKS ??? Before each treatment:     Machine Number:                   [facility]                                   Unit Machine # 7 with centralized RO                                   Portable Machine #1/RO serial # [id number]                                   Portable Machine #2/RO serial # [id number]                                   Portable Machine #3/RO serial # [id number]  [facility]                                   Portable Machine #11/RO serial # [id number]                                    Portable Machine #12/RO serial # [id number]                                   Portable Machine #13/RO serial #  [id number]      Alarm Test:  Pass time 1430         Other:          RO/Machine Log Complete      Temp    37            Extracorporeal Circuit Tested for integrity   Dialysate: pH  7.4 Conductivity: Meter   14     HD Machine   14                  TCD: 14  Dialyzer Lot # [id number]         Blood Tubing Lot # [id number]       Saline Lot #  [id number]     CHLORINE TESTING-Before each treatment and every 4 hours    Total Chlorine:  less than 0.1 ppm  Time: 1400 4 Hr/2nd Check Time: 1700   (if greater than 0.1 ppm from Primary then every 30 minutes from Secondary)     TREATMENT INITIATION ??? with Dialysis Precautions:    All Connections Secured                  Saline Line Double Clamped    Venous Parameters Set                   Arterial Parameters Set     Prime Given  250 ml               Air Foam Detector Engaged      Treatment Initiation Note: pt arrived in stable condition via bed AVF  accessed and treatment initiated without difficulty.  Dr Baldemar Friday at bedside; VO to reduce time to 3hrs and place on 3K bath.     Medication Dose Volume Route Initials Dialyzer Cleared:  Good  Fair   Poor    Blood processed:  68.9 L  UF Removed  2000 Ml    Post Wt: 77.1    kg  POst BP:   163/99       Pulse: 79      Respirations: 16  Temperature: 96.4                                   Post Tx Vascular Access: AVF/AVG: Bleeding stopped Art 10 min. Ven. 10 Min                                      Catheter: Locking solution: Heparin 1:1000 Art.  Ven.   N/A  Post Assessment:                                    Skin:   Warm   Dry  Diaphoretic     Flushed   Pale  Cyanotic   [facility] Signatures Title Initials  Time Lungs:  Clear     Course   Crackles   Wheezing  Diminished   Butler Denmark, RN    Cardiac:  Regular    Irregular    Monitor   N/A  Rhythm:           Edema:   None     General      Facial    Pedal     UE     LE       Pain: 0  1  2   3  4   5   6   7   8   9   10          Post Treatment Note: HD well tolerated. 2L UF removed.  No acute distress noted during or post HD treatment.     POST TREATMENT PRIMARY NURSE HANDOFF REPORT:     First initial/Last name/Title         Post Dialysis: Ander Slade, RN Time:  1900     Abbreviations: AVG-arterial venous graft, AVF-arterial venous fistula, IJ-Internal Jugular, Subcl-Subclavian, Fem-Femoral, Tx-treatment, AP/HR-apical heart rate, DFR-dialysate flow rate, BFR-blood flow rate, AP-arterial pressure, VP-venous pressure, UF-ultrafiltrate, TMP-transmembrane pressure, Ven-Venous, Art-Arterial, RO-Reverse Osmosis

## 2015-09-17 NOTE — Progress Notes (Signed)
[facility] Hospitalist Group  Progress Note    Patient: Robert Pierce Age: 45 y.o. DOB: [birth year] MR#: [id number] SSN: [id number]  Date/Time: 09/17/2015     Subjective: pt feeling fine, denies any melena or bloody stool. No SOB or CP.      Assessment/Plan:   1. Volume overload in the setting of noncompliance with hemodialysis. S/p short HD yesterday and regular HD today.  2. End-stage renal disease.  Out pt HD per renal team.   3. Uncontrolled hypertension. BP better   4. Chronic Diastolic HF, EF 31%.  5. Anemia with heme-positive stools with Hx gastrointestinal bleed in past: drop Hb from 12 to 8 from 07/24/15. change PPI to IV, awaiting GI eval.  6. Ongoing tobacco abuse. Adv on cessation  7. Thrombocytopenia due to # 8, will monitor  8. Hep C    Case discussed with:  Patient  Family  Nursing  Case Management  DVT Prophylaxis:  Lovenox  Hep SQ  SCDs  Coumadin   On Heparin gtt    Objective:   VS:   Visit Vitals   ??? BP (!) 146/100   ??? Pulse 74   ??? Temp 97.1 ??F (36.2 ??C) (Tympanic)   ??? Resp 13   ??? Ht 6' (1.829 m)   ??? Wt 77.1 kg (170 lb)   ??? SpO2 98%   ??? BMI 23.06 kg/m2      Tmax/24hrs: Temp (24hrs), Avg:97.8 ??F (36.6 ??C), Min:97.1 ??F (36.2 ??C), Max:98.2 ??F (36.8 ??C)  IOBRIEF  Intake/Output Summary (Last 24 hours) at 09/17/15 1128  Last data filed at 09/16/15 2245   Gross per 24 hour   Intake                0 ml   Output             3000 ml   Net            -3000 ml       General:  Alert, cooperative, no acute distress????  Pulmonary:  CTA Bilaterally. No Wheezing/Rhonchi/Rales.  Cardiovascular: Regular rate and Rhythm.  GI:  Soft, Non distended, Non tender. + Bowel sounds.  Extremities:  No edema, cyanosis, clubbing. No calf tenderness.   Psych: Good insight.??Not anxious or agitated.  Neurologic: Alert and oriented X 3. No acute neuro deficits.      Medications:   Current Facility-Administered Medications   Medication Dose Route Frequency    ??? 0.9% sodium chloride infusion  100 mL/hr IntraVENous DIALYSIS PRN   ??? albumin human 25% (BUMINATE) solution 12.5 g  12.5 g IntraVENous DIALYSIS PRN   ??? [START ON 09/20/2015] epoetin alfa (EPOGEN;PROCRIT) injection 10,000 Units  10,000 Units IntraVENous DIALYSIS MON, WED & FRI   ??? calcium acetate (PHOSLO) capsule 2,668 mg  4 Cap Oral TID WITH MEALS   ??? folic acid (FOLVITE) tablet 1 mg  1 mg Oral DAILY   ??? amLODIPine (NORVASC) tablet 10 mg  10 mg Oral DAILY   ??? hydrALAZINE (APRESOLINE) tablet 25 mg  25 mg Oral TID   ??? metoprolol tartrate (LOPRESSOR) tablet 50 mg  50 mg Oral BID   ??? citalopram (CELEXA) tablet 20 mg  20 mg Oral DAILY   ??? pantoprazole (PROTONIX) tablet 40 mg  40 mg Oral ACB   ??? thiamine (B-1) tablet 100 mg  100 mg Oral DAILY   ??? albuterol (PROVENTIL VENTOLIN) nebulizer solution 2.5 mg  2.5 mg Nebulization Q4H PRN   ???  hydrALAZINE (APRESOLINE) tablet 10 mg  10 mg Oral Q6H PRN     Current Outpatient Prescriptions   Medication Sig   ??? calcium acetate (PHOSLO) 667 mg cap Take 4 Caps by mouth three (3) times daily (with meals).   ??? folic acid (FOLVITE) 1 mg tablet Take 1 Tab by mouth daily.   ??? hydrALAZINE (APRESOLINE) 25 mg tablet Take 1 Tab by mouth three (3) times daily.   ??? metoprolol tartrate (LOPRESSOR) 50 mg tablet Take 1 Tab by mouth two (2) times a day.   ??? amLODIPine (NORVASC) 10 mg tablet Take 1 Tab by mouth daily.   ??? albuterol (PROVENTIL HFA, VENTOLIN HFA, PROAIR HFA) 90 mcg/actuation inhaler Take 1 Puff by inhalation as needed.   ??? pantoprazole (PROTONIX) 40 mg tablet Take 40 mg by mouth daily.     ??? citalopram (CELEXA) 20 mg tablet Take 1 Tab by mouth daily.   ??? thiamine (B-1) 100 mg tablet Take 1 Tab by mouth daily.   ??? multivitamin-iron, hematinic, (CEROVITE ADVANCED FORMULA) 27-0.4 mg Tab tablet Take 1 Tab by mouth daily.       Labs:    Recent Results (from the past 24 hour(s))   EKG, 12 LEAD, INITIAL    Collection Time: 09/16/15  3:47 PM   Result Value Ref Range    Ventricular Rate 88 BPM     Atrial Rate 88 BPM    P-R Interval 202 ms    QRS Duration 84 ms    Q-T Interval 388 ms    QTC Calculation (Bezet) 469 ms    Calculated P Axis 38 degrees    Calculated R Axis -2 degrees    Calculated T Axis 60 degrees    Diagnosis       Normal sinus rhythm  Nonspecific T wave abnormality  Prolonged QT  Abnormal ECG  When compared with ECG of 20-Jul-2015 15:33,  Sinus rhythm has replaced Junctional rhythm  Right bundle branch block is no longer present    Confirmed by Elita Quick [id number]) on 09/17/2015 8:09:01 AM     CBC WITH AUTOMATED DIFF    Collection Time: 09/16/15  3:53 PM   Result Value Ref Range    WBC 5.2 4.6 - 13.2 K/uL    RBC 3.13 (L) 4.70 - 5.50 M/uL    HGB 8.4 (L) 13.0 - 16.0 g/dL    HCT 25.4 (L) 36.0 - 48.0 %    MCV 81.2 74.0 - 97.0 FL    MCH 26.8 24.0 - 34.0 PG    MCHC 33.1 31.0 - 37.0 g/dL    RDW 16.4 (H) 11.6 - 14.5 %    PLATELET 134 (L) 135 - 420 K/uL    MPV 8.6 (L) 9.2 - 11.8 FL    NEUTROPHILS 62 40 - 73 %    LYMPHOCYTES 19 (L) 21 - 52 %    MONOCYTES 11 (H) 3 - 10 %    EOSINOPHILS 8 (H) 0 - 5 %    BASOPHILS 0 0 - 2 %    ABS. NEUTROPHILS 3.2 1.8 - 8.0 K/UL    ABS. LYMPHOCYTES 1.0 0.9 - 3.6 K/UL    ABS. MONOCYTES 0.6 0.05 - 1.2 K/UL    ABS. EOSINOPHILS 0.4 0.0 - 0.4 K/UL    ABS. BASOPHILS 0.0 0.0 - 0.1 K/UL    DF AUTOMATED     METABOLIC PANEL, BASIC    Collection Time: 09/16/15  3:53 PM   Result Value Ref Range  Sodium 136 136 - 145 mmol/L    Potassium 4.4 3.5 - 5.5 mmol/L    Chloride 98 (L) 100 - 108 mmol/L    CO2 20 (L) 21 - 32 mmol/L    Anion gap 18 3.0 - 18 mmol/L    Glucose 90 74 - 99 mg/dL    BUN 99 (H) 7.0 - 18 MG/DL    Creatinine 17.20 (H) 0.6 - 1.3 MG/DL    BUN/Creatinine ratio 6 (L) 12 - 20      GFR est AA 4 (L) >60 ml/min/1.51m    GFR est non-AA 3 (L) >60 ml/min/1.727m   Calcium 7.4 (L) 8.5 - 10.1 MG/DL   PROTHROMBIN TIME + INR    Collection Time: 09/16/15  3:53 PM   Result Value Ref Range    Prothrombin time 12.8 11.5 - 15.2 sec    INR 1.0 0.8 - 1.2     PTT     Collection Time: 09/16/15  3:53 PM   Result Value Ref Range    aPTT 39.4 (H) 23.0 - 36.4 SEC   CARDIAC PANEL,(CK, CKMB & TROPONIN)    Collection Time: 09/16/15  3:53 PM   Result Value Ref Range    CK 142 39 - 308 U/L    CK - MB 2.1 0.5 - 3.6 ng/ml    CK-MB Index 1.5 0.0 - 4.0 %    Troponin-I, Qt. 0.02 0.0 - 0.045 NG/ML   MAGNESIUM    Collection Time: 09/16/15  3:53 PM   Result Value Ref Range    Magnesium 2.5 (H) 1.8 - 2.4 mg/dL   HEPATIC FUNCTION PANEL    Collection Time: 09/16/15  3:53 PM   Result Value Ref Range    Protein, total 7.3 6.4 - 8.2 g/dL    Albumin 2.7 (L) 3.4 - 5.0 g/dL    Globulin 4.6 (H) 2.0 - 4.0 g/dL    A-G Ratio 0.6 (L) 0.8 - 1.7      Bilirubin, total 0.6 0.2 - 1.0 MG/DL    Bilirubin, direct 0.3 (H) 0.0 - 0.2 MG/DL    Alk. phosphatase 70 45 - 117 U/L    AST (SGOT) 19 15 - 37 U/L    ALT (SGPT) 13 (L) 16 - 61 U/L   PHOSPHORUS    Collection Time: 09/16/15  4:15 PM   Result Value Ref Range    Phosphorus 8.8 (H) 2.5 - 4.9 MG/DL   TYPE & SCREEN    Collection Time: 09/16/15  5:52 PM   Result Value Ref Range    Crossmatch Expiration 09/19/2015     ABO/Rh(D) O Jenetta DownerOSITIVE     Antibody screen NEG    HEP B SURFACE AG    Collection Time: 09/16/15  9:00 PM   Result Value Ref Range    Hepatitis B surface Ag <0.10 <1.00 Index    Hep B surface Ag Interp. NEGATIVE  NEG     HEP B SURFACE AB    Collection Time: 09/16/15  9:00 PM   Result Value Ref Range    Hepatitis B surface Ab <3.10 (L) >10.0 mIU/mL    Hep B surface Ab Interp. NEGATIVE  (A) POS      Hep B surface Ab comment        Samples with a  value of 10 mIU/mL or greater are considered positive (protective immunity) in accordance with the CDC guidelines.   HGB & HCT    Collection Time: 09/16/15 11:40 PM  Result Value Ref Range    HGB 8.1 (L) 13.0 - 16.0 g/dL    HCT 24.0 (L) 36.0 - 48.0 %   CBC WITH AUTOMATED DIFF    Collection Time: 09/17/15  5:00 AM   Result Value Ref Range    WBC 3.8 (L) 4.6 - 13.2 K/uL    RBC 3.05 (L) 4.70 - 5.50 M/uL     HGB 8.1 (L) 13.0 - 16.0 g/dL    HCT 24.8 (L) 36.0 - 48.0 %    MCV 81.3 74.0 - 97.0 FL    MCH 26.6 24.0 - 34.0 PG    MCHC 32.7 31.0 - 37.0 g/dL    RDW 16.6 (H) 11.6 - 14.5 %    PLATELET 111 (L) 135 - 420 K/uL    MPV 8.2 (L) 9.2 - 11.8 FL    NEUTROPHILS 52 40 - 73 %    LYMPHOCYTES 27 21 - 52 %    MONOCYTES 11 (H) 3 - 10 %    EOSINOPHILS 10 (H) 0 - 5 %    BASOPHILS 0 0 - 2 %    ABS. NEUTROPHILS 2.0 1.8 - 8.0 K/UL    ABS. LYMPHOCYTES 1.0 0.9 - 3.6 K/UL    ABS. MONOCYTES 0.4 0.05 - 1.2 K/UL    ABS. EOSINOPHILS 0.4 0.0 - 0.4 K/UL    ABS. BASOPHILS 0.0 0.0 - 0.06 K/UL    DF AUTOMATED     METABOLIC PANEL, COMPREHENSIVE    Collection Time: 09/17/15  5:00 AM   Result Value Ref Range    Sodium 139 136 - 145 mmol/L    Potassium 3.8 3.5 - 5.5 mmol/L    Chloride 100 100 - 108 mmol/L    CO2 25 21 - 32 mmol/L    Anion gap 14 3.0 - 18 mmol/L    Glucose 84 74 - 99 mg/dL    BUN 63 (H) 7.0 - 18 MG/DL    Creatinine 12.40 (H) 0.6 - 1.3 MG/DL    BUN/Creatinine ratio 5 (L) 12 - 20      GFR est AA 5 (L) >60 ml/min/1.75m    GFR est non-AA 4 (L) >60 ml/min/1.711m   Calcium 7.0 (L) 8.5 - 10.1 MG/DL    Bilirubin, total 0.5 0.2 - 1.0 MG/DL    ALT (SGPT) 11 (L) 16 - 61 U/L    AST (SGOT) 17 15 - 37 U/L    Alk. phosphatase 61 45 - 117 U/L    Protein, total 6.7 6.4 - 8.2 g/dL    Albumin 2.4 (L) 3.4 - 5.0 g/dL    Globulin 4.3 (H) 2.0 - 4.0 g/dL    A-G Ratio 0.6 (L) 0.8 - 1.7     MAGNESIUM    Collection Time: 09/17/15  5:00 AM   Result Value Ref Range    Magnesium 2.3 1.8 - 2.4 mg/dL   PROTHROMBIN TIME + INR    Collection Time: 09/17/15  5:00 AM   Result Value Ref Range    Prothrombin time 13.6 11.5 - 15.2 sec    INR 1.1 0.8 - 1.2     PHOSPHORUS    Collection Time: 09/17/15  5:00 AM   Result Value Ref Range    Phosphorus 6.0 (H) 2.5 - 4.9 MG/DL   PTH INTACT    Collection Time: 09/17/15  5:00 AM   Result Value Ref Range    Calcium 7.0 (L) 8.5 - 10.1 MG/DL    PTH, Intact 386.0 (H) 14.0 -  72.0 pg/mL   FERRITIN    Collection Time: 09/17/15  5:00 AM    Result Value Ref Range    Ferritin 620 (H) 8 - 388 NG/ML   IRON PROFILE    Collection Time: 09/17/15  5:00 AM   Result Value Ref Range    Iron 190 (H) 50 - 175 ug/dL    TIBC 219 (L) 250 - 450 ug/dL    Iron % saturation 87 %       Signed By: Gordy Clement, MD     September 17, 2015

## 2015-09-17 NOTE — ED Notes (Signed)
Pt provided w/ ICS and educated w/return demo how to use.

## 2015-09-17 NOTE — Other (Signed)
TRANSFER - IN REPORT:    Verbal report received from [facility] (name) on Robert Pierce  being received from ED(unit) for routine progression of care      Report consisted of patient???s Situation, Background, Assessment and   Recommendations(SBAR).     Information from the following report(s) SBAR, Kardex and ED Summary was reviewed with the receiving nurse.    Opportunity for questions and clarification was provided.      Assessment completed upon patient???s arrival to unit and care assumed.

## 2015-09-17 NOTE — ED Notes (Signed)
Pt in bed with eyes on TV.

## 2015-09-17 NOTE — ED Notes (Signed)
Assumed care of patient. Pt was transferred back to ED from dialysis. No report was given from dialysis. Pt is AOX4; no c/o of chest pain or SOB at this time.

## 2015-09-17 NOTE — Other (Signed)
TRANSFER - OUT REPORT:    Verbal report given to Dorina, RN (name) on Robert Pierce  being transferred to 403 (unit) for routine progression of care       Report consisted of patient???s Situation, Background, Assessment and   Recommendations(SBAR).     Information from the following report(s) SBAR, ED Summary, [facility] and Recent Results was reviewed with the receiving nurse.    Lines:   Peripheral IV 09/16/15 Right Antecubital (Active)   Site Assessment Clean, dry, & intact 09/16/2015  7:28 PM   Phlebitis Assessment 0 09/16/2015  7:28 PM   Infiltration Assessment 0 09/16/2015  7:28 PM   Dressing Status Clean, dry, & intact 09/16/2015  7:28 PM        Opportunity for questions and clarification was provided.      Patient transported with:   Monitor   RN

## 2015-09-17 NOTE — Consults (Signed)
[facility]   [email or web address]      Impression:   1.multifactorial anemia without clinical gi bleed   2. Occult blood pos , no hx of prior colo  3. crf  4. Hep C with possible cirrhosis , no hx prior rx  5. Alcohol abuse reported   6. High iron sat >90%, r/o Hemochromatosis   7. 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 Recommend checking genetic marker for hemochromatosis as out pt   2. Colo egd and hep C/cirrhosis evaluation as out pt needed , he is not from this area and is visiting , this likely would be better done near his home , urgent endoscopic eval if he develops clinical gi bleeding.  I have explained this to the pt so he would understand the importance of getting all these gi and liver issues addressed once he gets home   3. Monitor blood count   4. Alcohol abstinence and avoid nsaid use rec     Available as needed , Dr Eulogio Ditch on call this weekend if you have any concerns about these issues and the timing of their evaluation .        Chief Complaint: SOB and weakness       HPI:  Robert Pierce is a 45 y.o. male who is being seen on consult for severe anemia in the setting of shortness of breath and brown heme pos stool without melena or hematochezia .  No dysphagia reflux or abd pain noted .  No hx of crcs in past .  Weight  Stable .  Hx of heavy alcohol use in past and hep C positivity with no prior work up or treatment.  He is visiting the area from [place] due to a death in the family.  He has a family doctor there .        PMH:   Past Medical History   Diagnosis Date   ??? Alcohol abuse    ??? Diastolic heart failure (HCC)    ??? Gastrointestinal bleeding    ??? Hepatitis C    ??? High blood pressure 01/13/2011   ??? History of echocardiogram 01/03/2011     EF 55-60%.  Gr 2 DDfx.  Increased RA pressure.  No R-L shunt.  Mild RAE.     ??? LE venous duplex 01/09/2011      No deep or superficial venous thrombosis in bilateral lower extremities.     ??? Migraine headache 01/13/2011       PSH:   No past surgical history on file.    Social HX:   Social History     Social History   ??? Marital status: SINGLE     Spouse name: N/A   ??? Number of children: N/A   ??? Years of education: N/A     Occupational History   ??? Not on file.     Social History Main Topics   ??? Smoking status: Current Every Day Smoker     Types: Cigarettes   ??? Smokeless tobacco: Never Used   ??? Alcohol use No   ??? Drug use: No   ??? Sexual activity: Yes     Partners: Female     Other Topics Concern   ??? Not on file     Social History Narrative   ??? No narrative on file  FHX:   No family history on file.    Allergy:   No Known Allergies    Home Medications:       (Not in a hospital admission)    Review of Systems:     Constitutional: No fevers, chills, weight loss, +fatigue.   Skin: No rashes, pruritis, jaundice, ulcerations, erythema.   HENT: No headaches, nosebleeds, sinus pressure, rhinorrhea, sore throat.   Eyes: No visual changes, blurred vision, eye pain, photophobia, jaundice.   Cardiovascular: No chest pain, heart palpitations.   Respiratory: No cough, +SOB, no wheezing, chest discomfort, orthopnea.   Gastrointestinal: No abd pain melena or hematochezia no n/v   Genitourinary: No dysuria, bleeding, discharge, pyuria.   Musculoskeletal: No weakness, arthralgias, wasting.   Endo: No sweats.   Heme: No bruising, easy bleeding.   Allergies: As noted.   Neurological: Cranial nerves intact.  Alert and oriented. Gait not assessed.   Psychiatric:  No anxiety, depression, hallucinations.                 Visit Vitals   ??? BP (!) 150/104   ??? Pulse 66   ??? Temp 96.6 ??F (35.9 ??C) (Tympanic)   ??? Resp 16   ??? Ht 6' (1.829 m)   ??? Wt 77.1 kg (170 lb)   ??? SpO2 100%   ??? BMI 23.06 kg/m2       Physical Assessment:     constitutional: appearance: well developed, well nourished, normal  habitus, no deformities, in no acute distress. Seen in dialysis   skin: inspection: no rashes, ulcers, icterus or other lesions; no clubbing or telangiectasias. palpation: no induration or subcutaneos nodules.   eyes: inspection: normal conjunctivae and lids; no jaundice pupils: symmetrical, normoreactive to light, normal accommodation and size.   ENMT: mouth: normal oral mucosa,lips and gums; good dentition. oropharynx: normal tongue, hard and soft palate; posterior pharynx without erythema, exudate or lesions.   neck: no masses organomegaly or tenderness.   respiratory: effort: normal chest excursion; no intercostal retraction or accessory muscle use.   cardiovascular: abdominal aorta: normal size and position; no bruits. palpation: PMI of normal size and position; normal rhythm; no thrill or murmurs.   abdominal: abdomen: normal consistency; no tenderness or masses. hernias: no hernias appreciated. liver: normal size and consistency. spleen: not palpable.   rectal: hemoccult/guaiac: not performed.   musculoskeletal: no deformities or muscle wasting   lymphatic: axilae: not palpable. groin: not palpable. neck: within normal limits. other: not palpable.   neurologic: cranial nerves: II-XII normal.   psychiatric: judgement/insight: within normal limits. memory: within normal limits for recent and remote events. mood and affect: no evidence of depression, anxiety or agitation. orientation: oriented to time, space and person.        Basic Metabolic Profile   Recent Labs      09/17/15   0500   NA  139   K  3.8   CL  100   CO2  25   BUN  63*   GLU  84   CA  7.0*   7.0*   MG  2.3   PHOS  6.0*         CBC w/Diff    Recent Labs      09/17/15   0500   WBC  3.8*   RBC  3.05*   HGB  8.1*   HCT  24.8*   MCV  81.3   MCH  26.6   MCHC  32.7   RDW  16.6*   PLT  111*    Recent Labs      09/17/15   0500   GRANS  52   LYMPH  27   EOS  10*        Hepatic Function   Recent Labs      09/17/15   0500   ALB  2.4*   TP  6.7   TBILI  0.5    SGOT  17   AP  61          J Maisie Fus, MD, M.D.   [facility] [place]  [phone]  [email or web address]

## 2015-09-17 NOTE — Other (Signed)
Pt refused skin assessment x3. Charge nurse Brittani made aware.

## 2015-09-17 NOTE — ED Notes (Signed)
Pt to dialysis

## 2015-09-17 NOTE — Consults (Signed)
Consult Note  Consult requested by: Dr. Jeanelle Malling Stockman is a 45 y.o. male BLACK OR AFR[place] AMERICAN who is being seen on consult for ESRD/HD  Chief Complaint   Patient presents with   ??? Shortness of Breath     Admission diagnosis: <principal problem not specified>     HPI: 45 yo AA male admitted for Acute SOB. Hx of ESRD normally dialysis in [place]. Was here a month ago for family issues and required HD while in town. He was sched with [facility] [address] and had his last HD there last week Wed ( was supposed to have gone back to [facility]). He is here for again family issues and has not dialyzed since then. He denies any CP, fever or cough. No N/V/D. Has been having some coughing, non productive and inc sob over the last few days. Still makes some urine and he says he has been able to cont with all his outpt meds  Had short HD last night and currently feeling better. Thinks he will be in town for a few more days maybe 2 weeks and will need outpt HD unit.   I had spoken to [facility] st, CM and she says they are not eager to take him back because of problems with noncompliance with sched but will be willing to take him back for a short time.     Past Medical History   Diagnosis Date   ??? Alcohol abuse    ??? Diastolic heart failure (HCC)    ??? Gastrointestinal bleeding    ??? Hepatitis C    ??? High blood pressure 01/13/2011   ??? History of echocardiogram 01/03/2011     EF 55-60%.  Gr 2 DDfx.  Increased RA pressure.  No R-L shunt.  Mild RAE.     ??? LE venous duplex 01/09/2011     No deep or superficial venous thrombosis in bilateral lower extremities.     ??? Migraine headache 01/13/2011      No past surgical history on file.    Social History     Social History   ??? Marital status: SINGLE     Spouse name: N/A   ??? Number of children: N/A   ??? Years of education: N/A     Occupational History   ??? Not on file.     Social History Main Topics   ??? Smoking status: Current Every Day Smoker     Types: Cigarettes    ??? Smokeless tobacco: Never Used   ??? Alcohol use No   ??? Drug use: No   ??? Sexual activity: Yes     Partners: Female     Other Topics Concern   ??? Not on file     Social History Narrative   ??? No narrative on file       No family history on file.  No Known Allergies     Home Medications:     Prior to Admission Medications   Prescriptions Last Dose Informant Patient Reported? Taking?   albuterol (PROVENTIL HFA, VENTOLIN HFA, PROAIR HFA) 90 mcg/actuation inhaler   No No   Sig: Take 1 Puff by inhalation as needed.   amLODIPine (NORVASC) 10 mg tablet   No No   Sig: Take 1 Tab by mouth daily.   calcium acetate (PHOSLO) 667 mg cap   No No   Sig: Take 4 Caps by mouth three (3) times daily (with meals).   citalopram (CELEXA) 20 mg tablet  No No   Sig: Take 1 Tab by mouth daily.   folic acid (FOLVITE) 1 mg tablet   No No   Sig: Take 1 Tab by mouth daily.   hydrALAZINE (APRESOLINE) 25 mg tablet   No No   Sig: Take 1 Tab by mouth three (3) times daily.   metoprolol tartrate (LOPRESSOR) 50 mg tablet   No No   Sig: Take 1 Tab by mouth two (2) times a day.   multivitamin-iron, hematinic, (CEROVITE ADVANCED FORMULA) 27-0.4 mg Tab tablet   No No   Sig: Take 1 Tab by mouth daily.   pantoprazole (PROTONIX) 40 mg tablet   Yes No   Sig: Take 40 mg by mouth daily.     thiamine (B-1) 100 mg tablet   No No   Sig: Take 1 Tab by mouth daily.      Facility-Administered Medications: None       Current Facility-Administered Medications   Medication Dose Route Frequency   ??? 0.9% sodium chloride infusion  100 mL/hr IntraVENous DIALYSIS PRN   ??? albumin human 25% (BUMINATE) solution 12.5 g  12.5 g IntraVENous DIALYSIS PRN   ??? [START ON 09/20/2015] epoetin alfa (EPOGEN;PROCRIT) injection 10,000 Units  10,000 Units IntraVENous DIALYSIS MON, WED & FRI   ??? calcium acetate (PHOSLO) capsule 2,668 mg  4 Cap Oral TID WITH MEALS   ??? folic acid (FOLVITE) tablet 1 mg  1 mg Oral DAILY   ??? amLODIPine (NORVASC) tablet 10 mg  10 mg Oral DAILY    ??? hydrALAZINE (APRESOLINE) tablet 25 mg  25 mg Oral TID   ??? metoprolol tartrate (LOPRESSOR) tablet 50 mg  50 mg Oral BID   ??? citalopram (CELEXA) tablet 20 mg  20 mg Oral DAILY   ??? pantoprazole (PROTONIX) tablet 40 mg  40 mg Oral ACB   ??? thiamine (B-1) tablet 100 mg  100 mg Oral DAILY   ??? albuterol (PROVENTIL VENTOLIN) nebulizer solution 2.5 mg  2.5 mg Nebulization Q4H PRN   ??? hydrALAZINE (APRESOLINE) tablet 10 mg  10 mg Oral Q6H PRN     Current Outpatient Prescriptions   Medication Sig   ??? calcium acetate (PHOSLO) 667 mg cap Take 4 Caps by mouth three (3) times daily (with meals).   ??? folic acid (FOLVITE) 1 mg tablet Take 1 Tab by mouth daily.   ??? hydrALAZINE (APRESOLINE) 25 mg tablet Take 1 Tab by mouth three (3) times daily.   ??? metoprolol tartrate (LOPRESSOR) 50 mg tablet Take 1 Tab by mouth two (2) times a day.   ??? amLODIPine (NORVASC) 10 mg tablet Take 1 Tab by mouth daily.   ??? albuterol (PROVENTIL HFA, VENTOLIN HFA, PROAIR HFA) 90 mcg/actuation inhaler Take 1 Puff by inhalation as needed.   ??? pantoprazole (PROTONIX) 40 mg tablet Take 40 mg by mouth daily.     ??? citalopram (CELEXA) 20 mg tablet Take 1 Tab by mouth daily.   ??? thiamine (B-1) 100 mg tablet Take 1 Tab by mouth daily.   ??? multivitamin-iron, hematinic, (CEROVITE ADVANCED FORMULA) 27-0.4 mg Tab tablet Take 1 Tab by mouth daily.       Review of Systems:   Pertinent items are noted in HPI.  Data Review:    Labs: Results:       Chemistry Recent Labs      09/17/15   0500  09/16/15   1553   GLU  84  90   NA  139  136  K  3.8  4.4   CL  100  98*   CO2  25  20*   BUN  63*  99*   CREA  12.40*  17.20*   CA  7.0*   7.0*  7.4*   AGAP  14  18   BUCR  5*  6*   AP  61  70   TP  6.7  7.3   ALB  2.4*  2.7*   GLOB  4.3*  4.6*   AGRAT  0.6*  0.6*   phos 8.8 Mag 2.3   CBC w/Diff Recent Labs      09/17/15   0500  09/16/15   2340  09/16/15   1553   WBC  3.8*   --   5.2   RBC  3.05*   --   3.13*   HGB  8.1*  8.1*  8.4*   HCT  24.8*  24.0*  25.4*   PLT  111*   --   134*    GRANS  52   --   62   LYMPH  27   --   19*   EOS  10*   --   8*      Coagulation Recent Labs      09/17/15   0500  09/16/15   1553   PTP  13.6  12.8   INR  1.1  1.0   APTT   --   39.4*       Iron/Ferritin No results for input(s): IRON in the last 72 hours.    No lab exists for component: TIBCCALC   BNP No results for input(s): BNPP in the last 72 hours.   Cardiac Enzymes Recent Labs      09/16/15   1553   CPK  142   CKND1  1.5      Liver Enzymes Recent Labs      09/17/15   0500   TP  6.7   ALB  2.4*   AP  61   SGOT  17      Thyroid Studies Lab Results   Component Value Date/Time    TSH 1.35 01/01/2011 02:47 PM         Fe 190 Fer 620 Sat 87% IPTH 386  IMAGES: CXR new small right pleural effusion    EKG SR NSSTT wave changes    Physical Assessment:     Visit Vitals   ??? BP (!) 146/100   ??? Pulse 74   ??? Temp 97.1 ??F (36.2 ??C) (Tympanic)   ??? Resp 13   ??? Ht 6' (1.829 m)   ??? Wt 77.1 kg (170 lb)   ??? SpO2 98%   ??? BMI 23.06 kg/m2     Last 3 Recorded Weights in this Encounter    09/16/15 1357   Weight: 77.1 kg (170 lb)       Intake/Output Summary (Last 24 hours) at 09/17/15 1033  Last data filed at 09/16/15 2245   Gross per 24 hour   Intake                0 ml   Output             3000 ml   Net            -3000 ml       Physial Exam:  General appearance: alert, cooperative, no distress, appears stated age  Skin: normal coloration and turgor, no rashes,  no suspicious skin lesions noted.  HEENT: non icteric, pinkish conj  Neck: no JVD  Lungs: no wheezing, rales, + rhonchi  Heart: regular rate and rhythm, S1, S2 normal, no murmur, click, rub or gallop  Abdomen: soft, non-tender. Bowel sounds normal. No masses,  no organomegaly  Extremities: no LE edema, Left arm AVG + bruit    IMPRESSION AND PLAN:   ESRD 2 to HTN admitted for SOB and uremia from missed HD. Will sched HD again today and hopefully will be able to cont with outpt HD. Needs to get outpt sched. Defer to discharge planner to make arrangement with [facility].   HTN cont same meds  Anemia cont ESA with HD  2nd HPTH cont binders and hold off on active Vit D for now     Thank you will follow.    Tonette Bihari, MD  September 17, 2015

## 2015-09-17 NOTE — Progress Notes (Signed)
Hemodialysis Rounding Note      Patient: Robert Pierce               Sex: male          DOA: 09/16/2015  3:17 PM        Date of Birth:  1970-09-15      Age:  45 y.o.        LOS:  LOS: 1 day     Subjective:     Robert Pierce is a 45 y.o. African American who presents with Fluid overload  Pleural effusion  GI bleed  Symptomatic anemia  HTN (hypertension)  ESRD (end stage renal disease) (HCC).  The patient is dialyzing utilizing the following method:Intermittent Hemodialysis        Complaints none.     Current Facility-Administered Medications   Medication Dose Route Frequency   ??? 0.9% sodium chloride infusion  100 mL/hr IntraVENous DIALYSIS PRN   ??? albumin human 25% (BUMINATE) solution 12.5 g  12.5 g IntraVENous DIALYSIS PRN   ??? [START ON 09/20/2015] epoetin alfa (EPOGEN;PROCRIT) injection 10,000 Units  10,000 Units IntraVENous DIALYSIS MON, WED & FRI   ??? pantoprazole (PROTONIX) injection 40 mg  40 mg IntraVENous Q12H   ??? lidocaine (PF) (XYLOCAINE) 10 mg/mL (1 %) injection       ??? calcium acetate (PHOSLO) capsule 2,668 mg  4 Cap Oral TID WITH MEALS   ??? folic acid (FOLVITE) tablet 1 mg  1 mg Oral DAILY   ??? amLODIPine (NORVASC) tablet 10 mg  10 mg Oral DAILY   ??? hydrALAZINE (APRESOLINE) tablet 25 mg  25 mg Oral TID   ??? metoprolol tartrate (LOPRESSOR) tablet 50 mg  50 mg Oral BID   ??? citalopram (CELEXA) tablet 20 mg  20 mg Oral DAILY   ??? thiamine (B-1) tablet 100 mg  100 mg Oral DAILY   ??? albuterol (PROVENTIL VENTOLIN) nebulizer solution 2.5 mg  2.5 mg Nebulization Q4H PRN   ??? hydrALAZINE (APRESOLINE) tablet 10 mg  10 mg Oral Q6H PRN     Current Outpatient Prescriptions   Medication Sig   ??? calcium acetate (PHOSLO) 667 mg cap Take 4 Caps by mouth three (3) times daily (with meals).   ??? folic acid (FOLVITE) 1 mg tablet Take 1 Tab by mouth daily.   ??? hydrALAZINE (APRESOLINE) 25 mg tablet Take 1 Tab by mouth three (3) times daily.   ??? metoprolol tartrate (LOPRESSOR) 50 mg tablet Take 1 Tab by mouth two (2) times a day.    ??? amLODIPine (NORVASC) 10 mg tablet Take 1 Tab by mouth daily.   ??? albuterol (PROVENTIL HFA, VENTOLIN HFA, PROAIR HFA) 90 mcg/actuation inhaler Take 1 Puff by inhalation as needed.   ??? pantoprazole (PROTONIX) 40 mg tablet Take 40 mg by mouth daily.     ??? citalopram (CELEXA) 20 mg tablet Take 1 Tab by mouth daily.   ??? thiamine (B-1) 100 mg tablet Take 1 Tab by mouth daily.   ??? multivitamin-iron, hematinic, (CEROVITE ADVANCED FORMULA) 27-0.4 mg Tab tablet Take 1 Tab by mouth daily.          02/08 1901 - 02/10 0700  In: -   Out: 3000       Objective:     Blood pressure (!) 159/102, pulse 65, temperature 97.1 ??F (36.2 ??C), temperature source Tympanic, resp. rate (!) 7, height 6' (1.829 m), weight 77.1 kg (170 lb), SpO2 100 %.  Temp (24hrs), Avg:97.6 ??F (36.4 ??C), Min:97.1 ??  F (36.2 ??C), Max:98.1 ??F (36.7 ??C)      Blood Pressure: BP: (!) 159/102  Pulse: Pulse (Heart Rate): 65  Temp:  Temp: 97.1 ??F (36.2 ??C)    Artificial Kidney Dialyzer/Set Up Inspection: Revaclear   hours Duration of Treatment (hours): 2 hours   Heparin Bolus    Blood flow rate Blood Flow Rate (ml/min): 0 ml/min   Dialysate rate     Arterial Access Pressure Arterial Access Pressure (mmHg): 0  Venous Return Pressure Venous Return Pressure (mmHg): 0  Ultrafiltration Rate Goal/Amount of Fluid to Remove (mL): 3000 mL  Fluid Removal Fluid Removed (mL): 3500  Net Fluid Removal NET Fluid Removed (mL): 3000 ml      PHYSICAL EXAM:   HEENT:  Non icteric  NECK:  No JVD  CHEST AND LUNGS:  Clear ant/lat  CVS:  Regular no rub  EXT:  Left arm avg    DATA REVIEW:    Labs: Results:       Chemistry Recent Labs      09/17/15   0500  09/16/15   1553   GLU  84  90   NA  139  136   K  3.8  4.4   CL  100  98*   CO2  25  20*   BUN  63*  99*   CREA  12.40*  17.20*   CA  7.0*   7.0*  7.4*   AGAP  14  18   BUCR  5*  6*   AP  61  70   TP  6.7  7.3   ALB  2.4*  2.7*   GLOB  4.3*  4.6*   AGRAT  0.6*  0.6*      CBC w/Diff Recent Labs      09/17/15   0500  09/16/15   2340  09/16/15    1553   WBC  3.8*   --   5.2   RBC  3.05*   --   3.13*   HGB  8.1*  8.1*  8.4*   HCT  24.8*  24.0*  25.4*   PLT  111*   --   134*   GRANS  52   --   62   LYMPH  27   --   19*   EOS  10*   --   8*      Coagulation Recent Labs      09/17/15   0500  09/16/15   1553   PTP  13.6  12.8   INR  1.1  1.0   APTT   --   39.4*       Iron/Ferritin No results for input(s): IRON in the last 72 hours.    No lab exists for component: TIBCCALC   BNP No results for input(s): BNPP in the last 72 hours.   Cardiac Enzymes Recent Labs      09/16/15   1553   CPK  142   CKND1  1.5      Liver Enzymes Recent Labs      09/17/15   0500   TP  6.7   ALB  2.4*   AP  61   SGOT  17      Thyroid Studies Lab Results   Component Value Date/Time    TSH 1.35 01/01/2011 02:47 PM                  CULTURE:   )No results  for input(s): SDES, CULT in the last 72 hours.  No results for input(s): CULT in the last 72 hours.    Assessment/Plan:     End Stage Renal Disease:  Patient is tolerating dialysis treatment well..  Additionally the patient has experienced normal dialysis treatment during dialysis.  Dry weight   same.  K3 bath    At 3:24 PM on 09/17/2015, I saw and examined patient during hemodialysis treatment. The patient was receiving hemodialysis for treatment of end stage renal disease. I have also reviewed vital signs, intake and output, lab results and recent events, and agreed with today's dialysis order.      Anemia:  epo with HD    Renal Metabolic Bone Disease:  Cont binders                      Hypertension: controlled    Access: Graft adequate monitoring/no changes         Tonette Bihari, MD  09/17/2015

## 2015-09-17 NOTE — ED Notes (Signed)
Bedside and Verbal shift change report given to Etheleen Nicks RN (oncoming nurse) by Penni Bombard RN (offgoing nurse). Report included the following information SBAR, MAR, I/O LABS/RESULTS.Marland Kitchen

## 2015-09-17 NOTE — ED Notes (Signed)
Received report from Kendall RN

## 2015-09-18 LAB — CBC WITH AUTOMATED DIFF
ABS. BASOPHILS: 0 10*3/uL (ref 0.0–0.1)
ABS. EOSINOPHILS: 0.4 10*3/uL (ref 0.0–0.4)
ABS. LYMPHOCYTES: 1 10*3/uL (ref 0.9–3.6)
ABS. MONOCYTES: 0.4 10*3/uL (ref 0.05–1.2)
ABS. NEUTROPHILS: 1.9 10*3/uL (ref 1.8–8.0)
BASOPHILS: 0 % (ref 0–2)
EOSINOPHILS: 10 % — ABNORMAL HIGH (ref 0–5)
HCT: 24.7 % — ABNORMAL LOW (ref 36.0–48.0)
HGB: 8 g/dL — ABNORMAL LOW (ref 13.0–16.0)
LYMPHOCYTES: 28 % (ref 21–52)
MCH: 26.9 PG (ref 24.0–34.0)
MCHC: 32.4 g/dL (ref 31.0–37.0)
MCV: 83.2 FL (ref 74.0–97.0)
MONOCYTES: 11 % — ABNORMAL HIGH (ref 3–10)
MPV: 9.5 FL (ref 9.2–11.8)
NEUTROPHILS: 51 % (ref 40–73)
PLATELET: 167 10*3/uL (ref 135–420)
RBC: 2.97 M/uL — ABNORMAL LOW (ref 4.70–5.50)
RDW: 16.7 % — ABNORMAL HIGH (ref 11.6–14.5)
WBC: 3.7 10*3/uL — ABNORMAL LOW (ref 4.6–13.2)

## 2015-09-18 LAB — HGB & HCT
HCT: 25.2 % — ABNORMAL LOW (ref 36.0–48.0)
HGB: 8 g/dL — ABNORMAL LOW (ref 13.0–16.0)

## 2015-09-18 LAB — METABOLIC PANEL, BASIC
Anion gap: 10 mmol/L (ref 3.0–18)
BUN/Creatinine ratio: 4 — ABNORMAL LOW (ref 12–20)
BUN: 30 MG/DL — ABNORMAL HIGH (ref 7.0–18)
CO2: 28 mmol/L (ref 21–32)
Calcium: 7.7 MG/DL — ABNORMAL LOW (ref 8.5–10.1)
Chloride: 99 mmol/L — ABNORMAL LOW (ref 100–108)
Creatinine: 7.57 MG/DL — ABNORMAL HIGH (ref 0.6–1.3)
GFR est AA: 10 mL/min/{1.73_m2} — ABNORMAL LOW (ref >60)
GFR est non-AA: 8 mL/min/{1.73_m2} — ABNORMAL LOW (ref >60)
Glucose: 75 mg/dL (ref 74–99)
Potassium: 3.8 mmol/L (ref 3.5–5.5)
Sodium: 137 mmol/L (ref 136–145)

## 2015-09-18 MED FILL — VITAMIN B-1 100 MG TABLET: 100 mg | ORAL | Qty: 1

## 2015-09-18 MED FILL — CALCIUM ACETATE 667 MG CAP: 667 mg | ORAL | Qty: 4

## 2015-09-18 MED FILL — CITALOPRAM 20 MG TAB: 20 mg | ORAL | Qty: 1

## 2015-09-18 MED FILL — HYDRALAZINE 25 MG TAB: 25 mg | ORAL | Qty: 1

## 2015-09-18 MED FILL — AMLODIPINE 10 MG TAB: 10 mg | ORAL | Qty: 1

## 2015-09-18 MED FILL — PROTONIX 40 MG INTRAVENOUS SOLUTION: 40 mg | INTRAVENOUS | Qty: 40

## 2015-09-18 MED FILL — METOPROLOL TARTRATE 50 MG TAB: 50 mg | ORAL | Qty: 1

## 2015-09-18 MED FILL — FOLIC ACID 1 MG TAB: 1 mg | ORAL | Qty: 1

## 2015-09-18 NOTE — Discharge Summary (Signed)
PATIENT DISCHARGE INSTRUCTIONS      PATIENT DISCHARGE INSTRUCTIONS    Robert Pierce / [id number] DOB: June 17, 1971    Admitted 09/16/2015 Discharged: 09/18/2015     Dictated # [id number]    ?? It is important that you take the medication exactly as they are prescribed.   ?? Keep your medication in the bottles provided by the pharmacist and keep a list of the medication names, dosages, and times to be taken in your wallet.   ?? Do not take other medications without consulting your doctor.     What to do at Home    Recommended Diet: Cardiac Diet and Renal Diet    Recommended Activity: Activity as tolerated    Current Discharge Medication List      CONTINUE these medications which have NOT CHANGED    Details   calcium acetate (PHOSLO) 667 mg cap Take 4 Caps by mouth three (3) times daily (with meals).  Qty: 360 Cap, Refills: 0      hydrALAZINE (APRESOLINE) 25 mg tablet Take 1 Tab by mouth three (3) times daily.  Qty: 90 Tab, Refills: 0      metoprolol tartrate (LOPRESSOR) 50 mg tablet Take 1 Tab by mouth two (2) times a day.  Qty: 60 Tab, Refills: 0      amLODIPine (NORVASC) 10 mg tablet Take 1 Tab by mouth daily.  Qty: 30 Tab, Refills: 0      folic acid (FOLVITE) 1 mg tablet Take 1 Tab by mouth daily.  Qty: 30 Tab, Refills: 0      albuterol (PROVENTIL HFA, VENTOLIN HFA, PROAIR HFA) 90 mcg/actuation inhaler Take 1 Puff by inhalation as needed.  Qty: 1 Inhaler, Refills: 0      pantoprazole (PROTONIX) 40 mg tablet Take 40 mg by mouth daily.      Associated Diagnoses: Mallory - Weiss tear      citalopram (CELEXA) 20 mg tablet Take 1 Tab by mouth daily.  Qty: 30 Tab, Refills: 4    Associated Diagnoses: Depression      thiamine (B-1) 100 mg tablet Take 1 Tab by mouth daily.  Qty: 30 Tab, Refills: 5      multivitamin-iron, hematinic, (CEROVITE ADVANCED FORMULA) 27-0.4 mg Tab tablet Take 1 Tab by mouth daily.  Qty: 30 Tab, Refills: 5               Signed By: Baltazar Najjar, MD     September 18, 2015

## 2015-09-18 NOTE — Discharge Summary (Signed)
[facility]  DISCHARGE SUMMARY    Name:  Robert Pierce, Robert Pierce  MR#:  [id number]  DOB:  Oct 22, 1970  Account #:  [id number]  Date of Adm:  09/16/2015  Date of Discharge:  09/18/2015      DISPOSITION: Discharged to home.    DISCHARGE CONDITION: Stable.    DISCHARGE DIAGNOSES  1. Volume overload due to noncompliance with hemodialysis, status  post hemodialysis x2.  2. End-stage renal disease on hemodialysis.  3. Uncontrolled hypertension.  4. Chronic diastolic heart failure, compensated with ejection fraction of  55%.  5. Anemia with heme-positive stool, stable hemoglobin and hematocrit.  6. Hepatitis C.  7. Ongoing tobacco use.    DISCHARGE MEDICATIONS  1. PhosLo 667 mg 4 capsules 3 times a day.  2. Hydralazine 25 mg 3 times a day.  3. Norvasc 10 mg daily.  4. Metoprolol 50 mg b.i.d.  5. Folvite 1 mg daily.  6. Albuterol inhaler 2 puffs every 4 hours p.r.n. for shortness of breath.  7. Protonix 40 mg daily.  8. Celexa 20 mg daily.  9. Thiamine 100 mg daily.  10. Multivitamin 1 tablet p.o. daily.    IMAGING AND PROCEDURES: A chest x-ray was done at the time of  admission, showed new small right pleural effusion.    CONSULTANTS  1. Nephrology with Dr. Baldemar Friday.  2. Gastrointestinal with Dr. Hart Rochester.    HOSPITAL COURSE: The patient was admitted to the hospital with a  complaint of shortness of breath. He was found out to have fluid  overload as he missed dialysis outpatient at least for 5 days. Please  refer to hospital admission H and P done by Dr. Zachery Conch for further details.  The patient was dialyzed x2 here. He was seen by Dr. Baldemar Friday. I  spoke to Dr. Baldemar Friday, and the patient has been cleared for  discharge so he can go back to his hometown in [place] to have  dialysis done, or if he stays here, then he has dialysis unit set up at  [facility], per the patient. He knows that he needs to get dialysis  Monday, Wednesday, Friday. Dr. Baldemar Friday asked me to inform the   patient that the patient needs to call [facility] in [facility] on Monday  if he is still in town. The patient verbalized understanding. The patient  also told me he has all his medication at home. He also told me he has  chronic dark stool but no blood in the stool. Hemoccult was positive in  the emergency room. Dr. Hart Rochester from GI department saw this patient  and did not recommend any further intervention. The patient wishes to  continue further GI workup as an outpatient in his hometown. His  hemoglobin remained stable, did not require any blood transfusion. He  does not need to be in the hospital for any other reason. He will be  discharged home with the above-mentioned medication.    DISCHARGE INSTRUCTIONS  1. Diet: Cardiac and renal diet.  2. Activity as tolerated.  3. Follow up with the primary care physician in a week once he is back  in his hometown of [place].  4. Continue outpatient dialysis at [facility] in [place] on Monday,  Wednesday, Friday as scheduled. The patient is aware of it.    Total time: Greater than 35 minutes.        Evalee Jefferson, MD    VP / PIM  D:  09/18/2015   15:55  T:  09/19/2015  04:07  Job #:  [id number]

## 2015-09-18 NOTE — Progress Notes (Signed)
[facility]  Progress Note    Patient: Robert Pierce Age: 45 y.o. DOB: 12-Sep-1970 MR#: [id number] SSN: [id number]  Date/Time: 09/18/2015     Subjective: pt feeling fine, No chest pain. denies any melena or bloody stool. No SOB or cough.      Assessment/Plan:     1. Volume overload in the setting of noncompliance with hemodialysis. S/p HD x2. Talked to dr. Baldemar Friday - he can go home and patient has to call [facility] in [place] on Monday if he is still in town.  Patient knows where this unit is outpatient.   2. End-stage renal disease.  Out pt HD per renal team.   3. Uncontrolled hypertension. BP better. Cont current management.  Patient states he has all his PO medications at home, does not need any scripts.   4. Chronic Diastolic HF, EF 55%. Compensated.   5. Anemia with heme-positive stools with Hx gastrointestinal bleed in past: cont PPI. Stable H/H. Patient wishes further work up when he gets back to his home town. Stable H/H.   6. Ongoing tobacco abuse. Adv on cessation  7. Hep C    --> discharge home    Case discussed with:  Patient  Family  Nursing  Case Management  DVT Prophylaxis:  Lovenox  Hep SQ  SCDs  Coumadin   On Heparin gtt    Objective:   VS:   Visit Vitals   ??? BP (!) 154/101   ??? Pulse 65   ??? Temp 98.3 ??F (36.8 ??C)   ??? Resp 18   ??? Ht 6' (1.829 m)   ??? Wt 75.1 kg (165 lb 9.6 oz)   ??? SpO2 98%   ??? BMI 22.46 kg/m2      Tmax/24hrs: Temp (24hrs), Avg:98 ??F (36.7 ??C), Min:96.4 ??F (35.8 ??C), Max:98.6 ??F (37 ??C)  IOBRIEF    Intake/Output Summary (Last 24 hours) at 09/18/15 1548  Last data filed at 09/18/15 0720   Gross per 24 hour   Intake                0 ml   Output              200 ml   Net             -200 ml       General:  Alert, cooperative, no acute distress????  Pulmonary:  CTA Bilaterally. No Wheezing/Rhonchi/Rales.  Cardiovascular: Regular rate and Rhythm.  GI:  Soft, Non distended, Non tender. + Bowel sounds.   Extremities:  No edema, cyanosis, clubbing. No calf tenderness.   Neurologic: Alert and oriented X 3. No acute neuro deficits.      Medications:   Current Facility-Administered Medications   Medication Dose Route Frequency   ??? 0.9% sodium chloride infusion  100 mL/hr IntraVENous DIALYSIS PRN   ??? albumin human 25% (BUMINATE) solution 12.5 g  12.5 g IntraVENous DIALYSIS PRN   ??? [START ON 09/20/2015] epoetin alfa (EPOGEN;PROCRIT) injection 10,000 Units  10,000 Units IntraVENous DIALYSIS MON, WED & FRI   ??? pantoprazole (PROTONIX) injection 40 mg  40 mg IntraVENous Q12H   ??? calcium acetate (PHOSLO) capsule 2,668 mg  4 Cap Oral TID WITH MEALS   ??? folic acid (FOLVITE) tablet 1 mg  1 mg Oral DAILY   ??? amLODIPine (NORVASC) tablet 10 mg  10 mg Oral DAILY   ??? hydrALAZINE (APRESOLINE) tablet 25 mg  25 mg Oral TID   ??? metoprolol  tartrate (LOPRESSOR) tablet 50 mg  50 mg Oral BID   ??? citalopram (CELEXA) tablet 20 mg  20 mg Oral DAILY   ??? thiamine (B-1) tablet 100 mg  100 mg Oral DAILY   ??? albuterol (PROVENTIL VENTOLIN) nebulizer solution 2.5 mg  2.5 mg Nebulization Q4H PRN   ??? hydrALAZINE (APRESOLINE) tablet 10 mg  10 mg Oral Q6H PRN       Labs:    Recent Results (from the past 24 hour(s))   METABOLIC PANEL, BASIC    Collection Time: 09/18/15  3:25 AM   Result Value Ref Range    Sodium 137 136 - 145 mmol/L    Potassium 3.8 3.5 - 5.5 mmol/L    Chloride 99 (L) 100 - 108 mmol/L    CO2 28 21 - 32 mmol/L    Anion gap 10 3.0 - 18 mmol/L    Glucose 75 74 - 99 mg/dL    BUN 30 (H) 7.0 - 18 MG/DL    Creatinine 8.11 (H) 0.6 - 1.3 MG/DL    BUN/Creatinine ratio 4 (L) 12 - 20      GFR est AA 10 (L) >60 ml/min/1.70m2    GFR est non-AA 8 (L) >60 ml/min/1.78m2    Calcium 7.7 (L) 8.5 - 10.1 MG/DL   CBC WITH AUTOMATED DIFF    Collection Time: 09/18/15  3:25 AM   Result Value Ref Range    WBC 3.7 (L) 4.6 - 13.2 K/uL    RBC 2.97 (L) 4.70 - 5.50 M/uL    HGB 8.0 (L) 13.0 - 16.0 g/dL    HCT 91.4 (L) 78.2 - 48.0 %    MCV 83.2 74.0 - 97.0 FL     MCH 26.9 24.0 - 34.0 PG    MCHC 32.4 31.0 - 37.0 g/dL    RDW 95.6 (H) 21.3 - 14.5 %    PLATELET 167 135 - 420 K/uL    MPV 9.5 9.2 - 11.8 FL    NEUTROPHILS 51 40 - 73 %    LYMPHOCYTES 28 21 - 52 %    MONOCYTES 11 (H) 3 - 10 %    EOSINOPHILS 10 (H) 0 - 5 %    BASOPHILS 0 0 - 2 %    ABS. NEUTROPHILS 1.9 1.8 - 8.0 K/UL    ABS. LYMPHOCYTES 1.0 0.9 - 3.6 K/UL    ABS. MONOCYTES 0.4 0.05 - 1.2 K/UL    ABS. EOSINOPHILS 0.4 0.0 - 0.4 K/UL    ABS. BASOPHILS 0.0 0.0 - 0.1 K/UL    DF AUTOMATED     HGB & HCT    Collection Time: 09/18/15  9:25 AM   Result Value Ref Range    HGB 8.0 (L) 13.0 - 16.0 g/dL    HCT 08.6 (L) 57.8 - 48.0 %     Current Discharge Medication List      CONTINUE these medications which have NOT CHANGED    Details   calcium acetate (PHOSLO) 667 mg cap Take 4 Caps by mouth three (3) times daily (with meals).  Qty: 360 Cap, Refills: 0      hydrALAZINE (APRESOLINE) 25 mg tablet Take 1 Tab by mouth three (3) times daily.  Qty: 90 Tab, Refills: 0      metoprolol tartrate (LOPRESSOR) 50 mg tablet Take 1 Tab by mouth two (2) times a day.  Qty: 60 Tab, Refills: 0      amLODIPine (NORVASC) 10 mg tablet Take 1 Tab by mouth daily.  Qty: 30 Tab, Refills: 0      folic acid (FOLVITE) 1 mg tablet Take 1 Tab by mouth daily.  Qty: 30 Tab, Refills: 0      albuterol (PROVENTIL HFA, VENTOLIN HFA, PROAIR HFA) 90 mcg/actuation inhaler Take 1 Puff by inhalation as needed.  Qty: 1 Inhaler, Refills: 0      pantoprazole (PROTONIX) 40 mg tablet Take 40 mg by mouth daily.      Associated Diagnoses: Mallory - Weiss tear      citalopram (CELEXA) 20 mg tablet Take 1 Tab by mouth daily.  Qty: 30 Tab, Refills: 4    Associated Diagnoses: Depression      thiamine (B-1) 100 mg tablet Take 1 Tab by mouth daily.  Qty: 30 Tab, Refills: 5      multivitamin-iron, hematinic, (CEROVITE ADVANCED FORMULA) 27-0.4 mg Tab tablet Take 1 Tab by mouth daily.  Qty: 30 Tab, Refills: 5             Signed By: Baltazar Najjar, MD     September 18, 2015

## 2015-09-18 NOTE — Other (Signed)
Discharge papers given and patient was taken to the lobby via wheelchair.

## 2015-09-18 NOTE — Progress Notes (Signed)
Chaplain conducted an initial consultation and Spiritual Assessment for Robert Pierce, who is a 45 y.o.,male. Patient???s Primary Language is: [place].   According to the patient???s EMR Religious Affiliation is: Baptist.     The reason the Patient came to the hospital is:   Patient Active Problem List    Diagnosis Date Noted   ??? HTN (hypertension) 09/16/2015   ??? Pleural effusion 09/16/2015   ??? GI bleed 09/16/2015   ??? Fluid overload 09/16/2015   ??? Symptomatic anemia 09/16/2015   ??? Transaminasemia 07/21/2015   ??? ESRD needing dialysis (HCC) 07/20/2015   ??? Drug abuse 07/20/2015   ??? ESRD (end stage renal disease) (HCC) 07/13/2015   ??? Shortness of breath 07/13/2015   ??? Iron deficiency anemia 07/13/2015   ??? ETOH abuse 02/21/2011   ??? Migraine headache 01/13/2011   ??? Borderline high cholesterol 01/13/2011   ??? Diastolic heart failure (HCC) 01/13/2011        The Chaplain provided the following Interventions:  Initiated a relationship of care and support.   Listened empathically.  Provided information about Spiritual Care Services.  Chart reviewed.    The following outcomes where achieved:  Patient expressed gratitude for chaplain's visit.    Assessment:  Patient does not have any religious/cultural needs that will affect patient???s preferences in health care.  There are no spiritual or religious issues which require intervention at this time.     Plan:  Chaplains will continue to follow and will provide pastoral care on an as needed/requested basis.  Chaplain recommends bedside caregivers page chaplain on duty if patient shows signs of acute spiritual or emotional distress.    Chaplain Eilene Ghazi  Spiritual Care  [phone])

## 2015-09-18 NOTE — Other (Signed)
Patient: Robert Pierce Age: 45 y.o. Sex: male     Bedside and Verbal shift change report given to Art therapist (Cabin crew) by Renato Shin, LPN (offgoing nurse). Report included the following information SBAR, Kardex, MAR and Recent Results.  PATIENT GOALS FOR TODAY PATIENT PRIORITIES FOR TODAY   Goals are: Monitor lab values, monitor respiratory status, limit fluid intake, safety  Updated on Whiteboard in Patient???s Room: no   Patient states his/her priorities are: to feel better  Updated on Whiteboard in Patient???s Room: no     SITUATION:   Code Status: Full Code Reason for Admission: Fluid overload  Pleural effusion  GI bleed  Symptomatic anemia  HTN (hypertension)  ESRD (end stage renal disease) [facility])   Hospital day: 2 Problem List: Active Problems:    ESRD (end stage renal disease) (HCC) (07/13/2015)      HTN (hypertension) (09/16/2015)      Pleural effusion (09/16/2015)      GI bleed (09/16/2015)      Fluid overload (09/16/2015)      Symptomatic anemia (09/16/2015)       Attending Provider:   Jonah Blue, MD Allergies: No Known Allergies   BACKGROUND:   Past Medical History:   Past Medical History   Diagnosis Date   ??? Alcohol abuse    ??? Diastolic heart failure (HCC)    ??? Gastrointestinal bleeding    ??? Hepatitis C    ??? High blood pressure 01/13/2011   ??? History of echocardiogram 01/03/2011     EF 55-60%.  Gr 2 DDfx.  Increased RA pressure.  No R-L shunt.  Mild RAE.     ??? LE venous duplex 01/09/2011     No deep or superficial venous thrombosis in bilateral lower extremities.     ??? Migraine headache 01/13/2011     ASSESSMENT:   Neuro:   ,   CV:  Patient on Telemetry:             Patient has a pace maker:   Endocrine   Recent Glucose Results:   Lab Results   Component Value Date/Time    GLU 84 09/17/2015 05:00 AM        Respiratory:  O2 Device: Room air       Supplemental O2         Incentive Spirometery          GI  Current diet: DIET CARDIAC Regular; FR ; 70-70-70 (House)                              GU           Reasons if patient has a foley:    Patient Safety  Restraints:    Family notified:    Other Alternatives:    Fall Risk   Total Score: 1   Safety Measures: Bed/Chair-Wheels locked, Bed/Chair alarm on, Bed in low position, Call light within reach, Fall prevention (comment), Gripper socks, Side rails X 3 VTE Prophylaxis     Sequential Compression Device: Bilateral          WOUND (if present)  Wound Type:    Dressing present Dressing Present : No  Wound Concerns/Notes:  IV ACCESS     Reasons if patient has a central line:      PAIN  Pain Assessment  Pain Intensity 1: 0 (09/18/15 0000)        Patient Stated Pain Goal: 0  Time of last intervention:    Reassessment Completed: no Last Vitals:  Vitals:    09/17/15 1930 09/17/15 2112 09/17/15 2201 09/18/15 0000   BP: (!) 163/99 (!) 168/99 142/56 (!) 148/94   Pulse: 79 78 74 76   Resp: Temp: 96.4 ??F (35.8 ??C)  97.6 ??F (36.4 ??C) 98.1 ??F (36.7 ??C)   TempSrc: Tympanic      SpO2:  100% 100% 98%   Weight:   75.1 kg (165 lb 9.6 oz)    Height:          Last 3 Weights:  Last 3 Recorded Weights in this Encounter    09/16/15 1357 09/17/15 2201   Weight: 77.1 kg (170 lb) 75.1 kg (165 lb 9.6 oz)     Weight change: -1.996 kg (-4 lb 6.4 oz)  LAB RESULTS  Recent Labs      09/17/15   0500  09/16/15   2340  09/16/15   1553   WBC  3.8*   --   5.2   HGB  8.1*  8.1*  8.4*   HCT  24.8*  24.0*  25.4*   PLT  111*   --   134*     Recent Labs      09/17/15   0500  09/16/15   1553   NA  139  136   K  3.8  4.4   GLU  84  90   BUN  63*  99*   CREA  12.40*  17.20*   CA  7.0*   7.0*  7.4*   MG  2.3  2.5*   INR  1.1  1.0      RECOMMENDATIONS AND DISCHARGE PLANNING   1. Discharge plan for patient // Needs or barriers to disharge: TBD .  2. Estimated Discharge Date: TBD Posted on Whiteboard in Patient???s Room: no    "HEALS" SAFETY CHECK   A safety check occurred in the patient's room between off going nurse and oncoming nurse listed above.The safety check included the below items   Area Items   H  High Alert Meds  ? Verify all high alert medication drips (heparin, PCA, etc.)   E  Equipment ? Suction is set up for ALL patients (with yanker)  ? Red plugs utilized for all equipment (IV pumps, etc.)  ? WOW???s wiped down at end of shift.  ? Room stocked with oxygen, suction, and other unit-specific supplies   A  Alarms ? Bed alarm is set for fall risk patients  ? Ensure chair alarm is in place and activated if patient is up in a chair   L  Lines ? Check IV for any infiltration  ? Foley bag is empty if patient has a Foley   ? Tubing and IV bags are labeled   S  Safety   ? Room is clean, patient is clean, and equipment is clean.  ? Ensure room is clear and free of clutter  ? Hallways are clear from equipment besides carts.   ? Fall bracelet on for fall risk patients  ? Suction is set up for ALL patients (with yanker)  ? Isolation precautions followed, supplies available outside room, sign posted   Renato Shin, LPN

## 2015-10-08 DIAGNOSIS — D689 Coagulation defect, unspecified: Secondary | ICD-10-CM | POA: Insufficient documentation

## 2015-10-08 DIAGNOSIS — R52 Pain, unspecified: Secondary | ICD-10-CM | POA: Insufficient documentation

## 2015-10-08 DIAGNOSIS — K922 Gastrointestinal hemorrhage, unspecified: Secondary | ICD-10-CM | POA: Insufficient documentation

## 2015-10-08 DIAGNOSIS — N2581 Secondary hyperparathyroidism of renal origin: Secondary | ICD-10-CM | POA: Insufficient documentation

## 2015-11-15 DIAGNOSIS — D509 Iron deficiency anemia, unspecified: Secondary | ICD-10-CM | POA: Insufficient documentation

## 2016-04-30 ENCOUNTER — Emergency Department (HOSPITAL_COMMUNITY)
Admission: EM | Admit: 2016-04-30 | Discharge: 2016-04-30 | Disposition: A | Discharge status: Home / Self Care | Payer: Medicaid Other | Attending: Physician Assistant | Admitting: Physician Assistant

## 2016-04-30 ENCOUNTER — Encounter (HOSPITAL_COMMUNITY): Payer: Self-pay | Admitting: *Deleted

## 2016-04-30 DIAGNOSIS — N186 End stage renal disease: Secondary | ICD-10-CM | POA: Diagnosis not present

## 2016-04-30 DIAGNOSIS — Z992 Dependence on renal dialysis: Secondary | ICD-10-CM | POA: Insufficient documentation

## 2016-04-30 DIAGNOSIS — K047 Periapical abscess without sinus: Secondary | ICD-10-CM | POA: Insufficient documentation

## 2016-04-30 DIAGNOSIS — I132 Hypertensive heart and chronic kidney disease with heart failure and with stage 5 chronic kidney disease, or end stage renal disease: Secondary | ICD-10-CM | POA: Insufficient documentation

## 2016-04-30 DIAGNOSIS — F17210 Nicotine dependence, cigarettes, uncomplicated: Secondary | ICD-10-CM | POA: Diagnosis not present

## 2016-04-30 DIAGNOSIS — I5043 Acute on chronic combined systolic (congestive) and diastolic (congestive) heart failure: Secondary | ICD-10-CM | POA: Insufficient documentation

## 2016-04-30 DIAGNOSIS — R220 Localized swelling, mass and lump, head: Secondary | ICD-10-CM | POA: Diagnosis present

## 2016-04-30 MED ORDER — AMOXICILLIN 250 MG PO CAPS: 250.0000 mg | ORAL_CAPSULE | Freq: Every day | ORAL | 0 refills | Status: DC

## 2016-04-30 NOTE — Discharge Instructions (Signed)
You have been seen by your caregiver because of dental pain.  SEEK MEDICAL ATTENTION IF: The exam and treatment you received today has been provided on an emergency basis only. This is not a substitute for complete medical or dental care. If your problem worsens or new symptoms (problems) appear, and you are unable to arrange prompt follow-up care with your dentist, call or return to this location. CALL YOUR DENTIST OR RETURN IMMEDIATELY IF you develop a fever, rash, difficulty breathing or swallowing, neck or facial swelling, or other potentially serious concerns.  

## 2016-04-30 NOTE — ED Triage Notes (Addendum)
Pt reports swelling to left side of face for several days. Denies dental pain. Feels like swelling may be going into his neck. Denies fever. No resp distress noted at triage. Dialysis pt, last treatment was Friday.

## 2016-04-30 NOTE — ED Notes (Signed)
Pt is in stable condition upon d/c and ambulates from ED. 

## 2016-05-04 NOTE — ED Provider Notes (Signed)
[facility] DEPT Provider Note   CSN: [id number] Arrival date & time: 04/30/16  1009     History   Chief Complaint Chief Complaint  Patient presents with  . Facial Swelling    HPI Patrick Anthony is a 45 y.o. male who presents to the ED with cc of dental pain. He noticed pain and swelling in the Left face over the past 3 days. Swellng and pain have improved. Yesterday morning he woke up with blood coming from his mouth. He denies fevers, chills, myalgias. He has ESRD and dilayzes MWF. Last dialysis 2 days ago.  HPI  Past Medical History:  Diagnosis Date  . CHF (congestive heart failure) ([place])   . Chronic kidney disease    patient on dialysis tues-thurs-sat  . Cirrhosis of liver ([place])   . Drug addiction ([place])   . ETOH abuse   . Hepatitis   . Hypertension   . Renal disorder renal failure  . Sleep apnea   . Systolic and diastolic CHF, 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[employer]) 04/19/2013    Patient Active Problem List   Diagnosis Date Noted  . End stage renal disease ([place]) 10/17/2013  . Anemia in chronic kidney disease 08/30/2013  . Hepatitis C 08/30/2013  . ESRD on dialysis ([facility]) 08/30/2013  . Hypertensive crisis 08/28/2013  . Hypertensive emergency 04/20/2013  . Systolic and diastolic CHF, 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[place]) 04/19/2013  . HTN (hypertension) 04/19/2013    Past Surgical History:  Procedure Laterality Date  . AV FISTULA PLACEMENT Right   . AV FISTULA PLACEMENT Left 09/03/2013   Procedure: ARTERIOVENOUS (AV) FISTULA CREATION - LEFT BRACHIOCEPHALIC;  Surgeon: Conrad Lowden, MD;  Location: [facility];  Service: Vascular;  Laterality: Left;  . INSERTION OF DIALYSIS CATHETER N/A 08/29/2013   Procedure: INSERTION OF DIALYSIS CATHETER;  Surgeon: Mal Misty, MD;  Location: [facility];  Service: Vascular;  Laterality: N/A;  . PERIPHERAL VASCULAR CATHETERIZATION N/A 01/06/2015   Procedure: A/V Shuntogram/Fistulagram;  Surgeon: Katha Cabal, MD;  Location: [facility];  Service:  Cardiovascular;  Laterality: N/A;  . PERIPHERAL VASCULAR CATHETERIZATION Left 01/06/2015   Procedure: A/V Shunt Intervention;  Surgeon: Katha Cabal, MD;  Location: [facility];  Service: Cardiovascular;  Laterality: Left;       Home Medications    Prior to Admission medications   Medication Sig Start Date End Date Taking? Authorizing Provider  amLODipine (NORVASC) 10 MG tablet Take 1 tablet (10 mg total) by mouth at bedtime. 09/04/13  Yes Ivor Costa, MD  calcium acetate (PHOSLO) 667 MG capsule Take 3 capsules (2,001 mg total) by mouth 3 (three) times daily with meals. 09/04/13  Yes Ivor Costa, MD  cloNIDine (CATAPRES) 0.2 MG tablet Take 0.2 mg by mouth daily.   Yes Historical Provider, MD  hydrALAZINE (APRESOLINE) 100 MG tablet Take 1 tablet (100 mg total) by mouth every 8 (eight) hours. 09/04/13  Yes Ivor Costa, MD  metoprolol tartrate (LOPRESSOR) 25 MG tablet Take 25 mg by mouth daily.  04/12/16  Yes Historical Provider, MD  amoxicillin (AMOXIL) 250 MG capsule Take 1 capsule (250 mg total) by mouth daily. 04/30/16   Margarita Mail, PA-C  carvedilol (COREG) 25 MG tablet Take 2 tablets (50 mg total) by mouth 2 (two) times daily with a meal. Patient not taking: Reported on 04/30/2016 09/04/13   Ivor Costa, MD  nitroGLYCERIN (NITROSTAT) 0.4 MG SL tablet Place 0.4 mg under the tongue every 5 (five) minutes as needed for chest pain.  Historical Provider, MD  oxyCODONE-acetaminophen (PERCOCET) 5-325 MG per tablet Take 2 tablets by mouth every 4 (four) hours as needed. Patient not taking: Reported on 04/30/2016 02/13/14   Orpah Greek, MD    Family History Family History  Problem Relation Age of Onset  . Hypertension Mother   . Cancer Mother     breast    Social History Social History  Substance Use Topics  . Smoking status: Current Some Day Smoker    Packs/day: 0.25    Types: Cigarettes  . Smokeless tobacco: Not on file  . Alcohol use No     Allergies   Review of  patient's allergies indicates no known allergies.   Review of Systems Review of Systems Ten systems reviewed and are negative for acute change, except as noted in the HPI.    Physical Exam Updated Vital Signs BP 137/93   Pulse 86   Temp 98.1 F (36.7 C) (Oral)   Resp 18   Ht 6' (1.829 m)   Wt 77.1 kg   SpO2 99%   BMI 23.06 kg/m   Physical Exam  Constitutional: He appears well-developed and well-nourished. No distress.  HENT:  Head: Normocephalic and atraumatic.  Mouth/Throat: Oropharynx is clear and moist.    Eyes: Conjunctivae are normal. No scleral icterus.  Neck: Normal range of motion. Neck supple.  Cardiovascular: Normal rate, regular rhythm and normal heart sounds.   Left AV fistula with palpable thrill.  Pulmonary/Chest: Effort normal and breath sounds normal. No stridor. No respiratory distress.  Abdominal: Soft. There is no tenderness.  Musculoskeletal: He exhibits no edema.  Neurological: He is alert.  Skin: Skin is warm and dry. He is not diaphoretic.  Psychiatric: His behavior is normal.  Nursing note and vitals reviewed.    ED Treatments / Results  Labs (all labs ordered are listed, but only abnormal results are displayed) Labs Reviewed - No data to display  EKG  EKG Interpretation None       Radiology No results found.  Procedures Procedures (including critical care time)  Medications Ordered in ED Medications - No data to display   Initial Impression / Assessment and Plan / ED Course  I have reviewed the triage vital signs and the nursing notes.  Pertinent labs & imaging results that were available during my care of the patient were reviewed by me and considered in my medical decision making (see chart for details).  Clinical Course   .Patient with draining apical abscess. Will treat with amoxil. I consulted with pharmacy for renal dosing. .  Exam unconcerning for Ludwig's angina or spread of infection.[place] Nine patient to follow-up  with dentist.     Final Clinical Impressions(s) / ED Diagnoses   Final diagnoses:  Dental abscess    New Prescriptions Discharge Medication List as of 04/30/2016  1:06 PM    START taking these medications   Details  amoxicillin (AMOXIL) 250 MG capsule Take 1 capsule (250 mg total) by mouth daily., Starting Sun 04/30/2016, Print         Margarita Mail, PA-C 05/04/16 [place], MD 05/06/16 2107

## 2016-12-12 ENCOUNTER — Encounter: Payer: Self-pay | Admitting: Vascular Surgery

## 2016-12-12 ENCOUNTER — Ambulatory Visit (INDEPENDENT_AMBULATORY_CARE_PROVIDER_SITE_OTHER): Payer: Medicaid Other | Admitting: Vascular Surgery

## 2016-12-12 VITALS — BP 143/105 | HR 89 | Temp 98.2°F | Resp 21 | Ht 72.0 in | Wt 192.6 lb

## 2016-12-12 DIAGNOSIS — N186 End stage renal disease: Secondary | ICD-10-CM

## 2016-12-12 DIAGNOSIS — Z992 Dependence on renal dialysis: Secondary | ICD-10-CM

## 2016-12-12 NOTE — Progress Notes (Signed)
Vascular and Vein Specialist of [place]  Patient name: Patrick Anthony MRN: [id number] DOB: 12-Jul-1971 Sex: male  REASON FOR VISIT: Evaluation of left arm AV fistula  HPI: Patrick Anthony is a 46 y.o. male who had initial left arm AV fistula creation with Dr. Geryl Councilman in January 2015. In June 2016 he underwent stenting at the cephalic subclavian junction and [facility]. Recently he underwent a angioplasty at [facility] and had a puncture at the distal portion of this fistula above the anastomosis. He had some area of drainage from this and was placed on antibiotics yesterday and is seen today for evaluation of this. He reports that there was a stitch that was present and was removed yesterday as well. He has had no bleeding from this.  Past Medical History:  Diagnosis Date  . CHF (congestive heart failure) ([place])   . Chronic kidney disease    patient on dialysis tues-thurs-sat  . Cirrhosis of liver ([place])   . Drug addiction ([place])   . ETOH abuse   . Hepatitis   . Hypertension   . Renal disorder renal failure  . Sleep apnea   . Systolic and diastolic CHF, 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[place]) 04/19/2013    Family History  Problem Relation Age of Onset  . Hypertension Mother   . Cancer Mother     breast    SOCIAL HISTORY: Social History  Substance Use Topics  . Smoking status: Light Tobacco Smoker    Packs/day: 0.20    Types: Cigarettes  . Smokeless tobacco: Never Used  . Alcohol use No    No Known Allergies  Current Outpatient Prescriptions  Medication Sig Dispense Refill  . amLODipine (NORVASC) 10 MG tablet Take 1 tablet (10 mg total) by mouth at bedtime. 30 tablet 5  . calcium acetate (PHOSLO) 667 MG capsule Take 3 capsules (2,001 mg total) by mouth 3 (three) times daily with meals. 270 capsule 5  . cloNIDine (CATAPRES) 0.2 MG tablet Take 0.2 mg by mouth daily.    . hydrALAZINE (APRESOLINE) 100 MG tablet Take 1 tablet (100 mg total) by  mouth every 8 (eight) hours. 90 tablet 4  . metoprolol tartrate (LOPRESSOR) 25 MG tablet Take 25 mg by mouth daily.   3  . amoxicillin (AMOXIL) 250 MG capsule Take 1 capsule (250 mg total) by mouth daily. (Patient not taking: Reported on 12/12/2016) 10 capsule 0  . carvedilol (COREG) 25 MG tablet Take 2 tablets (50 mg total) by mouth 2 (two) times daily with a meal. (Patient not taking: Reported on 04/30/2016) 120 tablet 5  . nitroGLYCERIN (NITROSTAT) 0.4 MG SL tablet Place 0.4 mg under the tongue every 5 (five) minutes as needed for chest pain.    Marland Kitchen oxyCODONE-acetaminophen (PERCOCET) 5-325 MG per tablet Take 2 tablets by mouth every 4 (four) hours as needed. (Patient not taking: Reported on 04/30/2016) 20 tablet 0   No current facility-administered medications for this visit.     REVIEW OF SYSTEMS:  [X]  denotes positive finding, [ ]  denotes negative finding Cardiac  Comments:  Chest pain or chest pressure:    Shortness of breath upon exertion:    Short of breath when lying flat:    Irregular heart rhythm:        Vascular    Pain in calf, thigh, or hip brought on by ambulation:    Pain in feet at night that wakes you up from your sleep:     Blood clot in your veins:  Leg swelling:           PHYSICAL EXAM: Vitals:   12/12/16 1032 12/12/16 1033  BP: (!) 143/102 (!) 143/105  Pulse: 89   Resp: (!) 21   Temp: 98.2 F (36.8 C)   TempSrc: Oral   SpO2: 96%   Weight: 192 lb 9.6 oz (87.4 kg)   Height: 6' (1.829 m)     GENERAL: The patient is a well-nourished male, in no acute distress. The vital signs are documented above. CARDIOVASCULAR: Palpable left radial pulse. He does have what appears to be superficial excoriation where the suture was removed with no evidence of eschar extending down through the skin. PULMONARY: There is good air exchange  MUSCULOSKELETAL: There are no major deformities or cyanosis. NEUROLOGIC: No focal weakness or paresthesias are detected. SKIN: There are no  ulcers or rashes noted. PSYCHIATRIC: The patient has a normal affect.    MEDICAL ISSUES: Superficial area of breakdown related to suture at the distal portion of his fistula with a fistulogram site. I suspect that this is related only to this suture and does not represent a ulceration into the fistula itself. I discussed what he needs to look for and also when he should do feel has any bleeding from this. He will continue to keep close eye on this and notify should he develop any further breakdown    Rosetta Posner, MD [facility] Vascular and Vein Specialists [facility] Tel [phone] Pager [phone]

## 2017-03-07 DIAGNOSIS — Z992 Dependence on renal dialysis: Secondary | ICD-10-CM | POA: Insufficient documentation

## 2017-04-03 DIAGNOSIS — I10 Essential (primary) hypertension: Secondary | ICD-10-CM | POA: Diagnosis not present

## 2017-04-03 DIAGNOSIS — G479 Sleep disorder, unspecified: Secondary | ICD-10-CM | POA: Diagnosis not present

## 2017-05-03 ENCOUNTER — Ambulatory Visit (INDEPENDENT_AMBULATORY_CARE_PROVIDER_SITE_OTHER): Payer: Medicaid Other | Admitting: Cardiology

## 2017-05-03 ENCOUNTER — Encounter (INDEPENDENT_AMBULATORY_CARE_PROVIDER_SITE_OTHER): Payer: Self-pay

## 2017-05-03 ENCOUNTER — Encounter: Payer: Self-pay | Admitting: Cardiology

## 2017-05-03 VITALS — BP 132/90 | HR 78 | Ht 72.0 in | Wt 195.0 lb

## 2017-05-03 DIAGNOSIS — I5042 Chronic combined systolic (congestive) and diastolic (congestive) heart failure: Secondary | ICD-10-CM | POA: Diagnosis not present

## 2017-05-03 DIAGNOSIS — R079 Chest pain, unspecified: Secondary | ICD-10-CM | POA: Diagnosis not present

## 2017-05-03 DIAGNOSIS — I10 Essential (primary) hypertension: Secondary | ICD-10-CM

## 2017-05-03 NOTE — Progress Notes (Signed)
Cardiology Office Note    Date:  05/03/2017   ID:  Patrick Anthony, DOB 08-22-1970, MRN [id number]  PCP:  [name], MD  Cardiologist:  Fransico Him, MD   Chief Complaint  Patient presents with  . New Patient (Initial Visit)  . Chest Pain    pressure    History of Present Illness:  Patrick Anthony is a 46 y.o. male who is being seen today for the evaluation of chest pain during HD at the request of No ref. provider found.  This is a 46yo AAM with a history of ESRD on HD, liver cirrhosis with hepatitis, polysubstance abuse with drug addiction and ETOH addiction, HTN and chronic combined systolic/diastolic CHF.  He had an echo in 2014 showing mild LV dysfunction with EF 40-45% with moderate LVH, G2DD, mild AR/MR and mild to moderate TR.  He has been having episodes of CP at HD and is now referred for further evaluation.  He says that this has been occurring for about 1 month.  He says that he will occurs while he is on the dialysis machine.  His BP will shoot up and he will start to feel his heart race.  It is mid sternal with no radiation and usually resolves within stopping the machine. There is no change with position.  He will feel diaphoretic and SOB but no nausea.  It is now occurring early into the HD and can only make it through 1 hour of HD.  His BP can be as high [id number] systolic and amlodipine was added but still having problems with diastolic HTN.  He never has CP at any other time except when he has too much volume overload.  He does have DOE and LE edema and has some orthopnea and PND when volume overloaded.  He denies any dizziness or syncope.      Past Medical History:  Diagnosis Date  . Chronic kidney disease    patient on dialysis tues-thurs-sat  . Cirrhosis of liver ([place])   . Drug addiction ([place])   . ETOH abuse   . Hepatitis   . Hypertension   . Renal disorder renal failure  . Sleep apnea   . Systolic and diastolic CHF, 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[place]) 04/19/2013    Past  Surgical History:  Procedure Laterality Date  . AV FISTULA PLACEMENT Right   . AV FISTULA PLACEMENT Left 09/03/2013   Procedure: ARTERIOVENOUS (AV) FISTULA CREATION - LEFT BRACHIOCEPHALIC;  Surgeon: Conrad Roanoke, MD;  Location: [place];  Service: Vascular;  Laterality: Left;  . INSERTION OF DIALYSIS CATHETER N/A 08/29/2013   Procedure: INSERTION OF DIALYSIS CATHETER;  Surgeon: Mal Misty, MD;  Location: [place];  Service: Vascular;  Laterality: N/A;  . PERIPHERAL VASCULAR CATHETERIZATION N/A 01/06/2015   Procedure: A/V Shuntogram/Fistulagram;  Surgeon: Katha Cabal, MD;  Location: [place] [facility];  Service: Cardiovascular;  Laterality: N/A;  . PERIPHERAL VASCULAR CATHETERIZATION Left 01/06/2015   Procedure: A/V Shunt Intervention;  Surgeon: Katha Cabal, MD;  Location: [place] CV [facility];  Service: Cardiovascular;  Laterality: Left;    Current Medications: Current Meds  Medication Sig  . amLODipine (NORVASC) 10 MG tablet Take 1 tablet (10 mg total) by mouth at bedtime.  . calcium acetate (PHOSLO) 667 MG capsule Take 3 capsules (2,001 mg total) by mouth 3 (three) times daily with meals.  . cloNIDine (CATAPRES) 0.2 MG tablet Take 0.2 mg by mouth daily.  . hydrALAZINE (APRESOLINE) 100 MG tablet Take 1  tablet (100 mg total) by mouth every 8 (eight) hours.  . metoprolol tartrate (LOPRESSOR) 25 MG tablet Take 25 mg by mouth daily.   . nitroGLYCERIN (NITROSTAT) 0.4 MG SL tablet Place 0.4 mg under the tongue every 5 (five) minutes as needed for chest pain.    Allergies:   Patient has no known allergies.   Social History   Social History  . Marital status: Single    Spouse name: N/A  . Number of children: N/A  . Years of education: N/A   Social History Main Topics  . Smoking status: Light Tobacco Smoker    Packs/day: 0.20    Types: Cigarettes  . Smokeless tobacco: Never Used  . Alcohol use No  . Drug use: No     Comment: Quit  . Sexual activity: Not Asked   Other Topics  Concern  . None   Social History Narrative   ** Merged History Encounter **         Family History:  The patient's family history includes Cancer in his mother; Hypertension in his mother.   ROS:   Please see the history of present illness.    ROS All other systems reviewed and are negative.  No flowsheet data found.     PHYSICAL EXAM:   VS:  BP 132/90   Pulse 78   Ht 6' (1.829 m)   Wt 195 lb (88.5 kg)   BMI 26.45 kg/m    GEN: Well nourished, well developed, in no acute distress  HEENT: normal  Neck: no JVD, carotid bruits, or masses Cardiac: RRR; no murmurs, rubs, or gallops,no edema.  Intact distal pulses bilaterally.  Respiratory:  clear to auscultation bilaterally, normal work of breathing GI: soft, nontender, nondistended, + BS MS: no deformity or atrophy  Skin: warm and dry, no rash Neuro:  Alert and Oriented x 3, Strength and sensation are intact Psych: euthymic mood, full affect  Wt Readings from Last 3 Encounters:  05/03/17 195 lb (88.5 kg)  12/12/16 192 lb 9.6 oz (87.4 kg)  04/30/16 170 lb (77.1 kg)      Studies/Labs Reviewed:   EKG:  EKG is ordered today.  The ekg ordered today demonstrates NSR at 78bpm with LVH by voltage with lateral T wave abnormality, LAE  Recent Labs: No results found for requested labs within last 8760 hours.   Lipid Panel    Component Value Date/Time   CHOL 165 08/28/2013 2107   TRIG 126 08/28/2013 2107   HDL 32 (L) 08/28/2013 2107   CHOLHDL 5.2 08/28/2013 2107   VLDL 25 08/28/2013 2107   LDLCALC 108 (H) 08/28/2013 2107    Additional studies/ records that were reviewed today include:  Office notes from Nephrology    ASSESSMENT:    1. Chest pain, unspecified type   2. Essential hypertension   3. Chronic combined systolic and diastolic heart failure (HCC)      PLAN:  In order of problems listed above:  1. Chest pain  2.  HTN - his BP is adequately controlled on exam today.  He will continue on amlodipine  10mg  daily, Clonidine 0.2mg  daily, Hydralazine 100mg  TID and Metoprolol 25mg  daily.    3.  Chronic combined systolic/diastolic CHF - he appears euvolemic on exam today.  His last echo 4 years ago showed mildly reduced LVF with EF 40-45%.  I will repeat an echo to reassess LVF.  Volume being managed by nephrology and HD.      Medication Adjustments/Labs and  Tests Ordered: Current medicines are reviewed at length with the patient today.  Concerns regarding medicines are outlined above.  Medication changes, Labs and Tests ordered today are listed in the Patient Instructions below.  There are no Patient Instructions on file for this visit.   Signed, Fransico Him, MD  05/03/2017 3:34 PM    [address], [place], [place]  [address] Phone: [phone]; Fax: [phone]

## 2017-05-03 NOTE — Patient Instructions (Signed)
Medication Instructions:  The current medical regimen is effective;  continue present plan and medications.  Testing/Procedures: Your physician has requested that you have an echocardiogram. Echocardiography is a painless test that uses sound waves to create images of your heart. It provides your doctor with information about the size and shape of your heart and how well your heart's chambers and valves are working. This procedure takes approximately one hour. There are no restrictions for this procedure.  Your physician has requested that you have a lexiscan myoview. For further information please visit [email or web address]. Please follow instruction sheet, as given.  Follow-Up: Follow up as needed after the above testing.  Thank you for choosing [facility]!!

## 2017-05-21 ENCOUNTER — Telehealth (HOSPITAL_COMMUNITY): Payer: Self-pay | Admitting: *Deleted

## 2017-05-21 NOTE — Telephone Encounter (Signed)
Left message on voicemail in reference to upcoming appointment scheduled for 05/24/17. Phone number given for a call back so details instructions can be given. Raysean Graumann, [place]

## 2017-05-22 ENCOUNTER — Other Ambulatory Visit (HOSPITAL_BASED_OUTPATIENT_CLINIC_OR_DEPARTMENT_OTHER): Payer: Self-pay

## 2017-05-22 ENCOUNTER — Telehealth (HOSPITAL_COMMUNITY): Payer: Self-pay | Admitting: *Deleted

## 2017-05-22 ENCOUNTER — Other Ambulatory Visit (HOSPITAL_COMMUNITY): Payer: Self-pay

## 2017-05-22 ENCOUNTER — Encounter (HOSPITAL_COMMUNITY): Payer: Self-pay

## 2017-05-22 DIAGNOSIS — R0683 Snoring: Secondary | ICD-10-CM

## 2017-05-22 DIAGNOSIS — G4730 Sleep apnea, unspecified: Secondary | ICD-10-CM

## 2017-05-22 NOTE — Telephone Encounter (Signed)
Patient given detailed instructions per Myocardial Perfusion Study Information Sheet for the test on 05/24/17 at 0945. Patient notified to arrive 15 minutes early and that it is imperative to arrive on time for appointment to keep from having the test rescheduled.  If you need to cancel or reschedule your appointment, please call the office within 24 hours of your appointment. . Patient verbalized understanding.Kimbree Casanas, [place]

## 2017-05-24 ENCOUNTER — Ambulatory Visit (HOSPITAL_COMMUNITY): Payer: Medicaid Other | Attending: Cardiovascular Disease

## 2017-05-24 ENCOUNTER — Other Ambulatory Visit: Payer: Self-pay

## 2017-05-24 ENCOUNTER — Ambulatory Visit (HOSPITAL_BASED_OUTPATIENT_CLINIC_OR_DEPARTMENT_OTHER): Payer: Medicaid Other

## 2017-05-24 DIAGNOSIS — F1010 Alcohol abuse, uncomplicated: Secondary | ICD-10-CM | POA: Insufficient documentation

## 2017-05-24 DIAGNOSIS — K7460 Unspecified cirrhosis of liver: Secondary | ICD-10-CM | POA: Insufficient documentation

## 2017-05-24 DIAGNOSIS — I5042 Chronic combined systolic (congestive) and diastolic (congestive) heart failure: Secondary | ICD-10-CM

## 2017-05-24 DIAGNOSIS — I509 Heart failure, unspecified: Secondary | ICD-10-CM | POA: Insufficient documentation

## 2017-05-24 DIAGNOSIS — I340 Nonrheumatic mitral (valve) insufficiency: Secondary | ICD-10-CM | POA: Insufficient documentation

## 2017-05-24 DIAGNOSIS — I10 Essential (primary) hypertension: Secondary | ICD-10-CM

## 2017-05-24 DIAGNOSIS — F1910 Other psychoactive substance abuse, uncomplicated: Secondary | ICD-10-CM | POA: Insufficient documentation

## 2017-05-24 DIAGNOSIS — R079 Chest pain, unspecified: Secondary | ICD-10-CM

## 2017-05-24 DIAGNOSIS — G4733 Obstructive sleep apnea (adult) (pediatric): Secondary | ICD-10-CM | POA: Insufficient documentation

## 2017-05-24 DIAGNOSIS — I130 Hypertensive heart and chronic kidney disease with heart failure and stage 1 through stage 4 chronic kidney disease, or unspecified chronic kidney disease: Secondary | ICD-10-CM | POA: Insufficient documentation

## 2017-05-24 DIAGNOSIS — I110 Hypertensive heart disease with heart failure: Secondary | ICD-10-CM | POA: Insufficient documentation

## 2017-05-24 DIAGNOSIS — N189 Chronic kidney disease, unspecified: Secondary | ICD-10-CM | POA: Insufficient documentation

## 2017-05-24 DIAGNOSIS — Z720 Tobacco use: Secondary | ICD-10-CM | POA: Insufficient documentation

## 2017-05-24 LAB — MYOCARDIAL PERFUSION IMAGING
LV dias vol: 165 mL (ref 62–150)
LV sys vol: 90 mL
Peak HR: 82 {beats}/min
RATE: 0.23
Rest HR: 65 {beats}/min
SDS: 4
SRS: 1
SSS: 5
TID: 1.03

## 2017-05-24 IMAGING — NM NM MISC PROCEDURE
9 series · 54 of 54 positions shown · non-contrast
Comparison: none

[Series 1: wbr_r-proj_st rest · 6.40mm/px · 6 of 64 frames shown]
[frame 6/64]
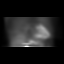
[frame 16/64]
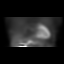
[frame 27/64]
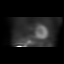
[frame 38/64]
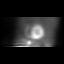
[frame 48/64]
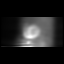
[frame 59/64]
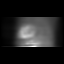

[Series 1: wbr_s-proj_st stress-gsp · 6.40mm/px · 6 of 512 frames shown]
[frame 43/512]
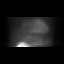
[frame 128/512]
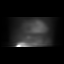
[frame 214/512]
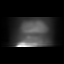
[frame 299/512]
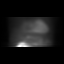
[frame 384/512]
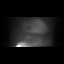
[frame 470/512]
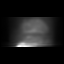

[Series 1: stress-sum-em_(id)_sa · 6.4mm · 6.40mm/px · 6 of 64 frames shown]
[frame 6/64]
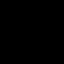
[frame 16/64]
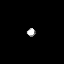
[frame 27/64]
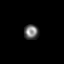
[frame 38/64]
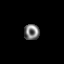
[frame 48/64]
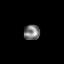
[frame 59/64]
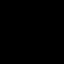

[Series 1: rest_(id)_sa · 6.4mm · 6.40mm/px · 6 of 64 frames shown]
[frame 6/64]
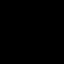
[frame 16/64]
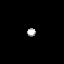
[frame 27/64]
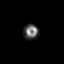
[frame 38/64]
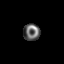
[frame 48/64]
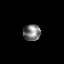
[frame 59/64]
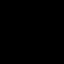

[Series 1: stress-gsp · 6.40mm/px · 6 of 512 frames shown]
[frame 43/512]
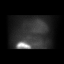
[frame 128/512]
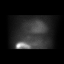
[frame 214/512]
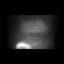
[frame 299/512]
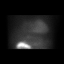
[frame 384/512]
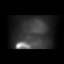
[frame 470/512]
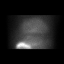

[Series 1: stress-sum-em · 6.40mm/px · 6 of 64 frames shown]
[frame 6/64]
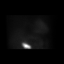
[frame 16/64]
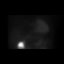
[frame 27/64]
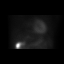
[frame 38/64]
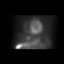
[frame 48/64]
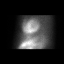
[frame 59/64]
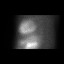

[Series 1: wbr_s-proj_st stress-sum-em · 6.40mm/px · 6 of 64 frames shown]
[frame 6/64]
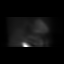
[frame 16/64]
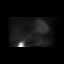
[frame 27/64]
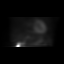
[frame 38/64]
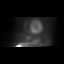
[frame 48/64]
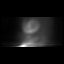
[frame 59/64]
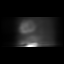

[Series 1: rest · 6.40mm/px · 6 of 64 frames shown]
[frame 6/64]
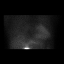
[frame 16/64]
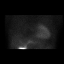
[frame 27/64]
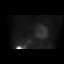
[frame 38/64]
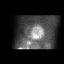
[frame 48/64]
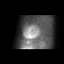
[frame 59/64]
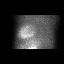

[Series 1: stress-gsp_(id)_sa · 6.4mm · 6.40mm/px · 6 of 512 frames shown]
[frame 43/512]
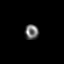
[frame 128/512]
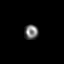
[frame 214/512]
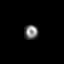
[frame 299/512]
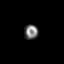
[frame 384/512]
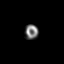
[frame 470/512]
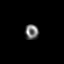

[54 of 54 positions shown; findings below may reference images not displayed]

Canned report from images found in remote index.

Refer to host system for actual result text.

## 2017-05-24 MED ORDER — TECHNETIUM TC 99M TETROFOSMIN IV KIT
10.2000 | PACK | Freq: Once | INTRAVENOUS | Status: AC | PRN
  Administered 2017-05-24: 10.2 via INTRAVENOUS
  Filled 2017-05-24: qty 11

## 2017-05-24 MED ORDER — TECHNETIUM TC 99M TETROFOSMIN IV KIT
32.4000 | PACK | Freq: Once | INTRAVENOUS | Status: AC | PRN
  Administered 2017-05-24: 32.4 via INTRAVENOUS
  Filled 2017-05-24: qty 33

## 2017-05-24 MED ORDER — REGADENOSON 0.4 MG/5ML IV SOLN
0.4000 mg | Freq: Once | INTRAVENOUS | Status: AC
  Administered 2017-05-24: 0.4 mg via INTRAVENOUS

## 2017-07-10 ENCOUNTER — Ambulatory Visit (HOSPITAL_BASED_OUTPATIENT_CLINIC_OR_DEPARTMENT_OTHER): Payer: Medicaid Other | Attending: Family Medicine | Admitting: Internal Medicine

## 2017-07-10 DIAGNOSIS — G4730 Sleep apnea, unspecified: Secondary | ICD-10-CM

## 2017-07-10 DIAGNOSIS — R0683 Snoring: Secondary | ICD-10-CM | POA: Diagnosis not present

## 2017-07-10 DIAGNOSIS — G4733 Obstructive sleep apnea (adult) (pediatric): Secondary | ICD-10-CM | POA: Insufficient documentation

## 2017-07-15 NOTE — Procedures (Signed)
Patient Name: Patrick Anthony, Patrick Anthony Study Date: 07/10/2017 Gender: Male D.O.B: Oct 20, 1970 Age (years): 46 Referring Provider: [name] Height (inches): 72 Interpreting Physician: Baird Lyons MD, ABSM Weight (lbs): 189 RPSGT: Madelon Lips BMI: 26 MRN: [id number] Neck Size: 17.50 CLINICAL INFORMATION The patient is referred for a split night study with BPAP. MEDICATIONS Medications self-administered by patient taken the night of the study : none reported  SLEEP STUDY TECHNIQUE As per the AASM Manual for the Scoring of Sleep and Associated Events v2.3 (April 2016) with a hypopnea requiring 4% desaturations.  The channels recorded and monitored were frontal, central and occipital EEG, electrooculogram (EOG), submentalis EMG (chin), nasal and oral airflow, thoracic and abdominal wall motion, anterior tibialis EMG, snore microphone, electrocardiogram, and pulse oximetry. Bi-level positive airway pressure (BiPAP) was initiated when the patient met split night criteria and was titrated according to treat sleep-disordered breathing.  RESPIRATORY PARAMETERS Diagnostic  Total AHI (/hr): 91.5 RDI (/hr): 92.8 OA Index (/hr): 66.9 CA Index (/hr): 0.9 REM AHI (/hr): N/A NREM AHI (/hr): 91.5 Supine AHI (/hr): 96.3 Non-supine AHI (/hr): 90.48 Min O2 Sat (%): 73.00 Mean O2 (%): 90.80 Time below 88% (min): 47.7   Titration  Optimal IPAP Pressure (cm): 24 Optimal EPAP Pressure (cm): 20 AHI at Optimal Pressure (/hr): 53.7 Min O2 at Optimal Pressure (%): 84.0 Sleep % at Optimal (%): 92 Supine % at Optimal (%): 100      SLEEP ARCHITECTURE The study was initiated at 10:09:30 PM and terminated at 4:52:26 AM. The total recorded time was 402.9 minutes. EEG confirmed total sleep time was 269.0 minutes yielding a sleep efficiency of 66.8%. Sleep onset after lights out was 7.4 minutes with a REM latency of 354.5 minutes. The patient spent 50.38% of the night in stage N1 sleep, 39.96% in stage N2 sleep, 0.00%  in stage N3 and 9.66% in REM. Wake after sleep onset (WASO) was 126.5 minutes. The Arousal Index was 74.7/hour.  LEG MOVEMENT DATA The total Periodic Limb Movements of Sleep (PLMS) were 0. The PLMS index was 0.00 .  CARDIAC DATA The 2 lead EKG demonstrated sinus rhythm. The mean heart rate was 71.93 beats per minute. Other EKG findings include: None.  IMPRESSIONS - Severe obstructive sleep apnea occurred during the diagnostic portion of the study (AHI = 91.5 /hour). - CPAP provided inadequate control at tolerated pressure.An optimal BIPAP pressure was selected for this patient ( 24 / 20 cm of water) - No significant central sleep apnea occurred during the diagnostic portion of the study (CAI = 0.9/hour). - Oxygen desaturation was noted during the diagnostic portion of the study (Min O2 = 73.00%). - The patient snored with loud snoring volume during the diagnostic portion of the study. - No cardiac abnormalities were noted during this study. - Clinically significant periodic limb movements of sleep did not occur during the study.  DIAGNOSIS - Obstructive Sleep Apnea (327.23 [G47.33 ICD-10])  RECOMMENDATIONS - Trial of BiPAP therapy on 24/20 cm H2O with a Medium size Fisher&Paykel Full Face Mask Simplus mask and heated humidification.. - Sleep hygiene should be reviewed to assess factors that may improve sleep quality. - Weight management and regular exercise should be initiated or continued.  [Electronically signed] 07/15/2017 05:02 PM  Baird Lyons MD, [place], American Board of Sleep Medicine   NPI: [id number]                          [place], [place] of  Sleep Medicine  ELECTRONICALLY SIGNED ON:  07/15/2017, 4:59 PM [place] PH: [phone]   FX: [phone] [place]

## 2017-09-07 DIAGNOSIS — Z23 Encounter for immunization: Secondary | ICD-10-CM | POA: Insufficient documentation

## 2018-01-15 ENCOUNTER — Ambulatory Visit: Payer: Medicaid Other | Admitting: Physician Assistant

## 2018-01-23 ENCOUNTER — Ambulatory Visit: Payer: Medicaid Other | Admitting: Physician Assistant

## 2018-02-04 NOTE — Progress Notes (Deleted)
Cardiology Office Note   Date:  02/04/2018   ID:  Patrick Anthony, DOB 09/13/1970, MRN [id number]  PCP:  [name], MD  Cardiologist:  Dr. Radford Pax    No chief complaint on file.     History of Present Illness: Patrick Anthony is a 47 y.o. male who presents for ***  a history of ESRD on HD, liver cirrhosis with hepatitis, polysubstance abuse with drug addiction and ETOH addiction, HTN and chronic combined systolic/diastolic CHF.  He had an echo in 2014 showing mild LV dysfunction with EF 40-45% with moderate LVH, G2DD, mild AR/MR and mild to moderate TR.  He has been having episodes of CP at HD and is now referred for further evaluation.  He says that this has been occurring for about 1 month.  He says that he will occurs while he is on the dialysis machine.  His BP will shoot up and he will start to feel his heart race.  It is mid sternal with no radiation and usually resolves within stopping the machine. There is no change with position.  He will feel diaphoretic and SOB but no nausea.  It is now occurring early into the HD and can only make it through 1 hour of HD.  His BP can be as high [id number] systolic and amlodipine was added but still having problems with diastolic HTN.  He never has CP at any other time except when he has too much volume overload.  He does have DOE and LE edema and has some orthopnea and PND when volume overloaded.  He denies any dizziness or syncope.    Echo 05/2017 with EF 55-60%m G1DD, Echo showed moderate LVH with normal LVF, trivial AR and mild MR, moderate LAE and severe RAE and mildly dilated aortic root at 30mm  Stress test was neg for ischemia.    Today ***  Past Medical History:  Diagnosis Date  . Chronic kidney disease    patient on dialysis tues-thurs-sat  . Cirrhosis of liver ([place])   . Drug addiction ([place])   . ETOH abuse   . Hepatitis   . Hypertension   . Renal disorder renal failure  . Sleep apnea   . Systolic and diastolic CHF, acute on  chronic ([place]) 04/19/2013    Past Surgical History:  Procedure Laterality Date  . AV FISTULA PLACEMENT Right   . AV FISTULA PLACEMENT Left 09/03/2013   Procedure: ARTERIOVENOUS (AV) FISTULA CREATION - LEFT BRACHIOCEPHALIC;  Surgeon: Conrad Catano, MD;  Location: [facility];  Service: Vascular;  Laterality: Left;  . INSERTION OF DIALYSIS CATHETER N/A 08/29/2013   Procedure: INSERTION OF DIALYSIS CATHETER;  Surgeon: Mal Misty, MD;  Location: [facility];  Service: Vascular;  Laterality: N/A;  . PERIPHERAL VASCULAR CATHETERIZATION N/A 01/06/2015   Procedure: A/V Shuntogram/Fistulagram;  Surgeon: Katha Cabal, MD;  Location: [facility];  Service: Cardiovascular;  Laterality: N/A;  . PERIPHERAL VASCULAR CATHETERIZATION Left 01/06/2015   Procedure: A/V Shunt Intervention;  Surgeon: Katha Cabal, MD;  Location: [facility];  Service: Cardiovascular;  Laterality: Left;     Current Outpatient Medications  Medication Sig Dispense Refill  . amLODipine (NORVASC) 10 MG tablet Take 1 tablet (10 mg total) by mouth at bedtime. 30 tablet 5  . calcium acetate (PHOSLO) 667 MG capsule Take 3 capsules (2,001 mg total) by mouth 3 (three) times daily with meals. 270 capsule 5  . cloNIDine (CATAPRES) 0.2 MG tablet Take 0.2 mg by mouth  daily.    . hydrALAZINE (APRESOLINE) 100 MG tablet Take 1 tablet (100 mg total) by mouth every 8 (eight) hours. 90 tablet 4  . metoprolol tartrate (LOPRESSOR) 25 MG tablet Take 25 mg by mouth daily.   3  . nitroGLYCERIN (NITROSTAT) 0.4 MG SL tablet Place 0.4 mg under the tongue every 5 (five) minutes as needed for chest pain.     No current facility-administered medications for this visit.     Allergies:   Patient has no known allergies.    Social History:  The patient  reports that he has been smoking cigarettes.  He has been smoking about 0.20 packs per day. He has never used smokeless tobacco. He reports that he does not drink alcohol or use drugs.   Family  History:  The patient's ***family history includes Cancer in his mother; Hypertension in his mother.    ROS:  General:no colds or fevers, no weight changes Skin:no rashes or ulcers HEENT:no blurred vision, no congestion CV:see HPI PUL:see HPI GI:no diarrhea constipation or melena, no indigestion GU:no hematuria, no dysuria MS:no joint pain, no claudication Neuro:no syncope, no lightheadedness Endo:no diabetes, no thyroid disease Wt Readings from Last 3 Encounters:  07/10/17 189 lb (85.7 kg)  05/03/17 195 lb (88.5 kg)  12/12/16 192 lb 9.6 oz (87.4 kg)     PHYSICAL EXAM: VS:  There were no vitals taken for this visit. , BMI There is no height or weight on file to calculate BMI. General:Pleasant affect, NAD Skin:Warm and dry, brisk capillary refill HEENT:normocephalic, sclera clear, mucus membranes moist Neck:supple, no JVD, no bruits  Heart:S1S2 RRR without murmur, gallup, rub or click Lungs:clear without rales, rhonchi, or wheezes XOV:ANVB, non tender, + BS, do not palpate liver spleen or masses Ext:no lower ext edema, 2+ pedal pulses, 2+ radial pulses Neuro:alert and oriented, MAE, follows commands, + facial symmetry    EKG:  EKG is ordered today. The ekg ordered today demonstrates ***   Recent Labs: No results found for requested labs within last 8760 hours.    Lipid Panel    Component Value Date/Time   CHOL 165 08/28/2013 2107   TRIG 126 08/28/2013 2107   HDL 32 (L) 08/28/2013 2107   CHOLHDL 5.2 08/28/2013 2107   VLDL 25 08/28/2013 2107   LDLCALC 108 (H) 08/28/2013 2107       Other studies Reviewed: Additional studies/ records that were reviewed today include: ***.   ASSESSMENT AND PLAN:  1.  ***   Current medicines are reviewed with the patient today.  The patient Has no concerns regarding medicines.  The following changes have been made:  See above Labs/ tests ordered today include:see above  Disposition:   FU:  see above  Signed, Cecilie Kicks, NP  02/04/2018 10:41 PM    [facility] [place], [place], [place], [place] Phone: [phone]; Fax: [phone]

## 2018-02-05 ENCOUNTER — Ambulatory Visit: Payer: Medicaid Other | Admitting: Cardiology

## 2018-02-06 ENCOUNTER — Encounter: Payer: Self-pay | Admitting: Cardiology

## 2018-02-18 DIAGNOSIS — Z7689 Persons encountering health services in other specified circumstances: Secondary | ICD-10-CM | POA: Diagnosis not present

## 2018-02-20 DIAGNOSIS — Z7689 Persons encountering health services in other specified circumstances: Secondary | ICD-10-CM | POA: Diagnosis not present

## 2018-02-22 DIAGNOSIS — Z7689 Persons encountering health services in other specified circumstances: Secondary | ICD-10-CM | POA: Diagnosis not present

## 2018-02-27 DIAGNOSIS — N2581 Secondary hyperparathyroidism of renal origin: Secondary | ICD-10-CM | POA: Diagnosis not present

## 2018-02-27 DIAGNOSIS — N186 End stage renal disease: Secondary | ICD-10-CM | POA: Diagnosis not present

## 2018-02-27 DIAGNOSIS — Z7689 Persons encountering health services in other specified circumstances: Secondary | ICD-10-CM | POA: Diagnosis not present

## 2018-03-01 DIAGNOSIS — Z7689 Persons encountering health services in other specified circumstances: Secondary | ICD-10-CM | POA: Diagnosis not present

## 2018-03-04 DIAGNOSIS — Z7689 Persons encountering health services in other specified circumstances: Secondary | ICD-10-CM | POA: Diagnosis not present

## 2018-03-06 DIAGNOSIS — Z992 Dependence on renal dialysis: Secondary | ICD-10-CM | POA: Diagnosis not present

## 2018-03-06 DIAGNOSIS — Z7689 Persons encountering health services in other specified circumstances: Secondary | ICD-10-CM | POA: Diagnosis not present

## 2018-03-06 DIAGNOSIS — I129 Hypertensive chronic kidney disease with stage 1 through stage 4 chronic kidney disease, or unspecified chronic kidney disease: Secondary | ICD-10-CM | POA: Diagnosis not present

## 2018-03-06 DIAGNOSIS — N186 End stage renal disease: Secondary | ICD-10-CM | POA: Diagnosis not present

## 2018-03-08 DIAGNOSIS — Z7689 Persons encountering health services in other specified circumstances: Secondary | ICD-10-CM | POA: Diagnosis not present

## 2018-03-11 DIAGNOSIS — Z7689 Persons encountering health services in other specified circumstances: Secondary | ICD-10-CM | POA: Diagnosis not present

## 2018-03-13 DIAGNOSIS — Z7689 Persons encountering health services in other specified circumstances: Secondary | ICD-10-CM | POA: Diagnosis not present

## 2018-03-15 DIAGNOSIS — Z7689 Persons encountering health services in other specified circumstances: Secondary | ICD-10-CM | POA: Diagnosis not present

## 2018-03-18 DIAGNOSIS — Z7689 Persons encountering health services in other specified circumstances: Secondary | ICD-10-CM | POA: Diagnosis not present

## 2018-03-20 DIAGNOSIS — Z7689 Persons encountering health services in other specified circumstances: Secondary | ICD-10-CM | POA: Diagnosis not present

## 2018-03-22 DIAGNOSIS — Z7689 Persons encountering health services in other specified circumstances: Secondary | ICD-10-CM | POA: Diagnosis not present

## 2018-03-25 DIAGNOSIS — Z7689 Persons encountering health services in other specified circumstances: Secondary | ICD-10-CM | POA: Diagnosis not present

## 2018-03-27 DIAGNOSIS — Z7689 Persons encountering health services in other specified circumstances: Secondary | ICD-10-CM | POA: Diagnosis not present

## 2018-03-29 DIAGNOSIS — Z7689 Persons encountering health services in other specified circumstances: Secondary | ICD-10-CM | POA: Diagnosis not present

## 2018-04-01 DIAGNOSIS — Z7689 Persons encountering health services in other specified circumstances: Secondary | ICD-10-CM | POA: Diagnosis not present

## 2018-04-03 DIAGNOSIS — Z7689 Persons encountering health services in other specified circumstances: Secondary | ICD-10-CM | POA: Diagnosis not present

## 2018-04-03 DIAGNOSIS — N186 End stage renal disease: Secondary | ICD-10-CM | POA: Diagnosis not present

## 2018-04-03 DIAGNOSIS — N2581 Secondary hyperparathyroidism of renal origin: Secondary | ICD-10-CM | POA: Diagnosis not present

## 2018-04-05 DIAGNOSIS — Z7689 Persons encountering health services in other specified circumstances: Secondary | ICD-10-CM | POA: Diagnosis not present

## 2018-04-06 DIAGNOSIS — N186 End stage renal disease: Secondary | ICD-10-CM | POA: Diagnosis not present

## 2018-04-06 DIAGNOSIS — I129 Hypertensive chronic kidney disease with stage 1 through stage 4 chronic kidney disease, or unspecified chronic kidney disease: Secondary | ICD-10-CM | POA: Diagnosis not present

## 2018-04-06 DIAGNOSIS — Z992 Dependence on renal dialysis: Secondary | ICD-10-CM | POA: Diagnosis not present

## 2018-04-29 DIAGNOSIS — Z7689 Persons encountering health services in other specified circumstances: Secondary | ICD-10-CM | POA: Diagnosis not present

## 2018-05-01 DIAGNOSIS — N186 End stage renal disease: Secondary | ICD-10-CM | POA: Diagnosis not present

## 2018-05-01 DIAGNOSIS — N2581 Secondary hyperparathyroidism of renal origin: Secondary | ICD-10-CM | POA: Diagnosis not present

## 2018-05-01 DIAGNOSIS — Z7689 Persons encountering health services in other specified circumstances: Secondary | ICD-10-CM | POA: Diagnosis not present

## 2018-05-03 DIAGNOSIS — Z7689 Persons encountering health services in other specified circumstances: Secondary | ICD-10-CM | POA: Diagnosis not present

## 2018-05-06 DIAGNOSIS — Z7689 Persons encountering health services in other specified circumstances: Secondary | ICD-10-CM | POA: Diagnosis not present

## 2018-05-06 DIAGNOSIS — Z992 Dependence on renal dialysis: Secondary | ICD-10-CM | POA: Diagnosis not present

## 2018-05-06 DIAGNOSIS — I129 Hypertensive chronic kidney disease with stage 1 through stage 4 chronic kidney disease, or unspecified chronic kidney disease: Secondary | ICD-10-CM | POA: Diagnosis not present

## 2018-05-06 DIAGNOSIS — N186 End stage renal disease: Secondary | ICD-10-CM | POA: Diagnosis not present

## 2018-05-08 DIAGNOSIS — Z7689 Persons encountering health services in other specified circumstances: Secondary | ICD-10-CM | POA: Diagnosis not present

## 2018-05-10 DIAGNOSIS — Z7689 Persons encountering health services in other specified circumstances: Secondary | ICD-10-CM | POA: Diagnosis not present

## 2018-05-13 DIAGNOSIS — Z7689 Persons encountering health services in other specified circumstances: Secondary | ICD-10-CM | POA: Diagnosis not present

## 2018-05-15 DIAGNOSIS — Z7689 Persons encountering health services in other specified circumstances: Secondary | ICD-10-CM | POA: Diagnosis not present

## 2018-05-17 DIAGNOSIS — Z7689 Persons encountering health services in other specified circumstances: Secondary | ICD-10-CM | POA: Diagnosis not present

## 2018-05-29 DIAGNOSIS — N2581 Secondary hyperparathyroidism of renal origin: Secondary | ICD-10-CM | POA: Diagnosis not present

## 2018-05-29 DIAGNOSIS — N186 End stage renal disease: Secondary | ICD-10-CM | POA: Diagnosis not present

## 2018-06-03 DIAGNOSIS — Z7689 Persons encountering health services in other specified circumstances: Secondary | ICD-10-CM | POA: Diagnosis not present

## 2018-06-05 DIAGNOSIS — Z7689 Persons encountering health services in other specified circumstances: Secondary | ICD-10-CM | POA: Diagnosis not present

## 2018-06-06 DIAGNOSIS — Z992 Dependence on renal dialysis: Secondary | ICD-10-CM | POA: Diagnosis not present

## 2018-06-06 DIAGNOSIS — N186 End stage renal disease: Secondary | ICD-10-CM | POA: Diagnosis not present

## 2018-06-06 DIAGNOSIS — I129 Hypertensive chronic kidney disease with stage 1 through stage 4 chronic kidney disease, or unspecified chronic kidney disease: Secondary | ICD-10-CM | POA: Diagnosis not present

## 2018-06-07 DIAGNOSIS — Z7689 Persons encountering health services in other specified circumstances: Secondary | ICD-10-CM | POA: Diagnosis not present

## 2018-06-10 DIAGNOSIS — Z7689 Persons encountering health services in other specified circumstances: Secondary | ICD-10-CM | POA: Diagnosis not present

## 2018-06-14 DIAGNOSIS — Z7689 Persons encountering health services in other specified circumstances: Secondary | ICD-10-CM | POA: Diagnosis not present

## 2018-06-24 DIAGNOSIS — Z7689 Persons encountering health services in other specified circumstances: Secondary | ICD-10-CM | POA: Diagnosis not present

## 2018-06-26 DIAGNOSIS — Z7689 Persons encountering health services in other specified circumstances: Secondary | ICD-10-CM | POA: Diagnosis not present

## 2018-06-28 DIAGNOSIS — Z7689 Persons encountering health services in other specified circumstances: Secondary | ICD-10-CM | POA: Diagnosis not present

## 2018-07-02 DIAGNOSIS — N2581 Secondary hyperparathyroidism of renal origin: Secondary | ICD-10-CM | POA: Diagnosis not present

## 2018-07-02 DIAGNOSIS — N186 End stage renal disease: Secondary | ICD-10-CM | POA: Diagnosis not present

## 2018-07-02 DIAGNOSIS — Z7689 Persons encountering health services in other specified circumstances: Secondary | ICD-10-CM | POA: Diagnosis not present

## 2018-07-05 DIAGNOSIS — Z7689 Persons encountering health services in other specified circumstances: Secondary | ICD-10-CM | POA: Diagnosis not present

## 2018-07-06 DIAGNOSIS — N186 End stage renal disease: Secondary | ICD-10-CM | POA: Diagnosis not present

## 2018-07-06 DIAGNOSIS — Z992 Dependence on renal dialysis: Secondary | ICD-10-CM | POA: Diagnosis not present

## 2018-07-06 DIAGNOSIS — I129 Hypertensive chronic kidney disease with stage 1 through stage 4 chronic kidney disease, or unspecified chronic kidney disease: Secondary | ICD-10-CM | POA: Diagnosis not present

## 2018-07-08 DIAGNOSIS — Z7689 Persons encountering health services in other specified circumstances: Secondary | ICD-10-CM | POA: Diagnosis not present

## 2018-07-12 DIAGNOSIS — Z7689 Persons encountering health services in other specified circumstances: Secondary | ICD-10-CM | POA: Diagnosis not present

## 2018-07-12 DIAGNOSIS — N186 End stage renal disease: Secondary | ICD-10-CM | POA: Diagnosis not present

## 2018-07-12 DIAGNOSIS — Z992 Dependence on renal dialysis: Secondary | ICD-10-CM | POA: Diagnosis not present

## 2018-07-26 ENCOUNTER — Ambulatory Visit: Payer: Medicaid Other | Admitting: Family

## 2018-07-29 ENCOUNTER — Encounter: Payer: Self-pay | Admitting: Family

## 2018-08-02 DIAGNOSIS — N186 End stage renal disease: Secondary | ICD-10-CM | POA: Diagnosis not present

## 2018-08-02 DIAGNOSIS — N2581 Secondary hyperparathyroidism of renal origin: Secondary | ICD-10-CM | POA: Diagnosis not present

## 2018-08-06 DIAGNOSIS — I129 Hypertensive chronic kidney disease with stage 1 through stage 4 chronic kidney disease, or unspecified chronic kidney disease: Secondary | ICD-10-CM | POA: Diagnosis not present

## 2018-08-06 DIAGNOSIS — Z7689 Persons encountering health services in other specified circumstances: Secondary | ICD-10-CM | POA: Diagnosis not present

## 2018-08-06 DIAGNOSIS — Z992 Dependence on renal dialysis: Secondary | ICD-10-CM | POA: Diagnosis not present

## 2018-08-06 DIAGNOSIS — N186 End stage renal disease: Secondary | ICD-10-CM | POA: Diagnosis not present

## 2018-08-09 DIAGNOSIS — Z7689 Persons encountering health services in other specified circumstances: Secondary | ICD-10-CM | POA: Diagnosis not present

## 2018-08-12 DIAGNOSIS — Z7689 Persons encountering health services in other specified circumstances: Secondary | ICD-10-CM | POA: Diagnosis not present

## 2018-08-14 DIAGNOSIS — Z7689 Persons encountering health services in other specified circumstances: Secondary | ICD-10-CM | POA: Diagnosis not present

## 2018-08-16 DIAGNOSIS — Z7689 Persons encountering health services in other specified circumstances: Secondary | ICD-10-CM | POA: Diagnosis not present

## 2018-08-19 DIAGNOSIS — Z7689 Persons encountering health services in other specified circumstances: Secondary | ICD-10-CM | POA: Diagnosis not present

## 2018-08-23 DIAGNOSIS — Z7689 Persons encountering health services in other specified circumstances: Secondary | ICD-10-CM | POA: Diagnosis not present

## 2018-08-30 DIAGNOSIS — N186 End stage renal disease: Secondary | ICD-10-CM | POA: Diagnosis not present

## 2018-08-30 DIAGNOSIS — N2581 Secondary hyperparathyroidism of renal origin: Secondary | ICD-10-CM | POA: Diagnosis not present

## 2018-09-06 DIAGNOSIS — I129 Hypertensive chronic kidney disease with stage 1 through stage 4 chronic kidney disease, or unspecified chronic kidney disease: Secondary | ICD-10-CM | POA: Diagnosis not present

## 2018-09-06 DIAGNOSIS — Z992 Dependence on renal dialysis: Secondary | ICD-10-CM | POA: Diagnosis not present

## 2018-09-06 DIAGNOSIS — N186 End stage renal disease: Secondary | ICD-10-CM | POA: Diagnosis not present

## 2018-09-11 DIAGNOSIS — Z8614 Personal history of Methicillin resistant Staphylococcus aureus infection: Secondary | ICD-10-CM | POA: Insufficient documentation

## 2018-09-13 DIAGNOSIS — Z8614 Personal history of Methicillin resistant Staphylococcus aureus infection: Secondary | ICD-10-CM | POA: Diagnosis not present

## 2018-10-02 DIAGNOSIS — N2581 Secondary hyperparathyroidism of renal origin: Secondary | ICD-10-CM | POA: Diagnosis not present

## 2018-10-02 DIAGNOSIS — N186 End stage renal disease: Secondary | ICD-10-CM | POA: Diagnosis not present

## 2018-10-05 DIAGNOSIS — Z992 Dependence on renal dialysis: Secondary | ICD-10-CM | POA: Diagnosis not present

## 2018-10-05 DIAGNOSIS — N186 End stage renal disease: Secondary | ICD-10-CM | POA: Diagnosis not present

## 2018-10-05 DIAGNOSIS — I129 Hypertensive chronic kidney disease with stage 1 through stage 4 chronic kidney disease, or unspecified chronic kidney disease: Secondary | ICD-10-CM | POA: Diagnosis not present

## 2018-11-01 DIAGNOSIS — N186 End stage renal disease: Secondary | ICD-10-CM | POA: Diagnosis not present

## 2018-11-01 DIAGNOSIS — N2581 Secondary hyperparathyroidism of renal origin: Secondary | ICD-10-CM | POA: Diagnosis not present

## 2018-11-05 DIAGNOSIS — N186 End stage renal disease: Secondary | ICD-10-CM | POA: Diagnosis not present

## 2018-11-05 DIAGNOSIS — Z992 Dependence on renal dialysis: Secondary | ICD-10-CM | POA: Diagnosis not present

## 2018-11-05 DIAGNOSIS — I129 Hypertensive chronic kidney disease with stage 1 through stage 4 chronic kidney disease, or unspecified chronic kidney disease: Secondary | ICD-10-CM | POA: Diagnosis not present

## 2018-11-26 ENCOUNTER — Ambulatory Visit (INDEPENDENT_AMBULATORY_CARE_PROVIDER_SITE_OTHER): Payer: Medicaid Other | Admitting: Vascular Surgery

## 2018-11-26 ENCOUNTER — Other Ambulatory Visit: Payer: Self-pay

## 2018-11-26 ENCOUNTER — Encounter: Payer: Self-pay | Admitting: Vascular Surgery

## 2018-11-26 VITALS — BP 181/121 | HR 86 | Resp 20 | Ht 72.0 in | Wt 183.0 lb

## 2018-11-26 DIAGNOSIS — Z992 Dependence on renal dialysis: Secondary | ICD-10-CM | POA: Diagnosis not present

## 2018-11-26 DIAGNOSIS — N186 End stage renal disease: Secondary | ICD-10-CM

## 2018-11-26 NOTE — Progress Notes (Signed)
[facility] [place]  Patient name: Patrick Anthony MRN: [id number] DOB: [birth year] Sex: male  REASON FOR VISIT: Evaluate left upper arm AV fistula  HPI: Patrick Anthony is a 48 y.o. male here today for evaluation of left upper arm AV fistula.  He has had a very good result with his fistula.  This was initially placed in 2015.  In 2016 he had angioplasty and stenting of the subclavian cephalic junction in [place].  Has done well since that time.  There was concern regarding some loss of pigment and thinning of the skin in the fistula site and he is seen today for evaluation.  He reports that he had 1 episode with a in the lower portion of his fistula which apparently was off center and had some bleeding around the time of dialysis but this was an isolated incident.  He reports that he is not having any difficulty with his dialysis runs.  He has no steal symptoms.  Past Medical History:  Diagnosis Date  . Chronic kidney disease    patient on dialysis tues-thurs-sat  . Cirrhosis of liver ([place])   . Drug addiction ([place])   . ETOH abuse   . Hepatitis   . Hypertension   . Renal disorder renal failure  . Sleep apnea   . Systolic and diastolic CHF, 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[place]) 04/19/2013    Family History  Problem Relation Age of Onset  . Hypertension Mother   . Cancer Mother        breast    SOCIAL HISTORY: Social History   Tobacco Use  . Smoking status: Light Tobacco Smoker    Packs/day: 0.20    Types: Cigarettes  . Smokeless tobacco: Never Used  Substance Use Topics  . Alcohol use: No    No Known Allergies  Current Outpatient Medications  Medication Sig Dispense Refill  . B Complex-C-Folic Acid (DIALYVITE 194) 0.8 MG TABS Take 1 tablet by mouth daily.    . calcium acetate (PHOSLO) 667 MG capsule Take 3 capsules (2,001 mg total) by mouth 3 (three) times daily with meals. 270 capsule 5  . cloNIDine (CATAPRES) 0.2 MG tablet  Take 0.2 mg by mouth daily.    . hydrALAZINE (APRESOLINE) 50 MG tablet TAKE 1 AND 1/2 TABLETS BY MOUTH THREE TIMES DAILY    . metoprolol tartrate (LOPRESSOR) 25 MG tablet Take 25 mg by mouth daily.   3  . nitroGLYCERIN (NITROSTAT) 0.4 MG SL tablet Place 0.4 mg under the tongue every 5 (five) minutes as needed for chest pain.    Marland Kitchen amLODipine (NORVASC) 10 MG tablet Take 1 tablet (10 mg total) by mouth at bedtime. (Patient not taking: Reported on 11/26/2018) 30 tablet 5   No current facility-administered medications for this visit.     REVIEW OF SYSTEMS:  [X]  denotes positive finding, [ ]  denotes negative finding Cardiac  Comments:  Chest pain or chest pressure: x   Shortness of breath upon exertion: x   Short of breath when lying flat: x   Irregular heart rhythm: x       Vascular    Pain in calf, thigh, or hip brought on by ambulation: x   Pain in feet at night that wakes you up from your sleep:  x   Blood clot in your veins:    Leg swelling:           PHYSICAL EXAM: Vitals:   11/26/18 0919  BP: (!) 181/121  Pulse:  86  Resp: 20  SpO2: 100%  Weight: 183 lb (83 kg)  Height: 6' (1.829 m)    GENERAL: The patient is a well-nourished male, in no acute distress. The vital signs are documented above. CARDIOVASCULAR: Well-developed upper arm fistula.  He does have a thrill but this is somewhat pulsatile in nature.  He does not have any evidence of skin breakdown.  There is loss of pigment in 2 places of frequent puncture sites and the 2 areas of dilatation over the access.  The skin is completely mobile over these areas and there is no evidence of skin breakdown. PULMONARY: There is good air exchange  MUSCULOSKELETAL: There are no major deformities or cyanosis. NEUROLOGIC: No focal weakness or paresthesias are detected. SKIN: There are no ulcers or rashes noted. PSYCHIATRIC: The patient has a normal affect.  DATA:  None  MEDICAL ISSUES: I discussed the findings with the patient.   He is not having any difficulty with flow.  I did explain that with a somewhat pulsatile nature that if there was any concern with flow the neck step would be a fistulogram to determine if he was developing narrowing in the proximal portion of his fistula.  Discussed what he should do if he ever had any bleeding from his fistula in the future.  Also explained what he needed to look for as far as skin breakdown over the fistula.  He was comfortable with this and will see [place] again on an as-needed basis    Rosetta Posner, MD [facility]in Specialists [facility] Tel [phone] Pager [phone]

## 2018-11-29 DIAGNOSIS — N2581 Secondary hyperparathyroidism of renal origin: Secondary | ICD-10-CM | POA: Diagnosis not present

## 2018-11-29 DIAGNOSIS — N186 End stage renal disease: Secondary | ICD-10-CM | POA: Diagnosis not present

## 2018-12-05 DIAGNOSIS — I129 Hypertensive chronic kidney disease with stage 1 through stage 4 chronic kidney disease, or unspecified chronic kidney disease: Secondary | ICD-10-CM | POA: Diagnosis not present

## 2018-12-05 DIAGNOSIS — N186 End stage renal disease: Secondary | ICD-10-CM | POA: Diagnosis not present

## 2018-12-05 DIAGNOSIS — Z992 Dependence on renal dialysis: Secondary | ICD-10-CM | POA: Diagnosis not present

## 2018-12-06 DIAGNOSIS — D509 Iron deficiency anemia, unspecified: Secondary | ICD-10-CM | POA: Diagnosis not present

## 2019-01-03 DIAGNOSIS — N2581 Secondary hyperparathyroidism of renal origin: Secondary | ICD-10-CM | POA: Diagnosis not present

## 2019-01-03 DIAGNOSIS — N186 End stage renal disease: Secondary | ICD-10-CM | POA: Diagnosis not present

## 2019-01-05 DIAGNOSIS — Z992 Dependence on renal dialysis: Secondary | ICD-10-CM | POA: Diagnosis not present

## 2019-01-05 DIAGNOSIS — I129 Hypertensive chronic kidney disease with stage 1 through stage 4 chronic kidney disease, or unspecified chronic kidney disease: Secondary | ICD-10-CM | POA: Diagnosis not present

## 2019-01-05 DIAGNOSIS — N186 End stage renal disease: Secondary | ICD-10-CM | POA: Diagnosis not present

## 2019-01-06 DIAGNOSIS — Z8614 Personal history of Methicillin resistant Staphylococcus aureus infection: Secondary | ICD-10-CM | POA: Diagnosis not present

## 2019-01-07 ENCOUNTER — Ambulatory Visit: Payer: Medicaid Other | Admitting: Vascular Surgery

## 2019-01-31 DIAGNOSIS — N186 End stage renal disease: Secondary | ICD-10-CM | POA: Diagnosis not present

## 2019-01-31 DIAGNOSIS — N2581 Secondary hyperparathyroidism of renal origin: Secondary | ICD-10-CM | POA: Diagnosis not present

## 2019-02-04 DIAGNOSIS — I129 Hypertensive chronic kidney disease with stage 1 through stage 4 chronic kidney disease, or unspecified chronic kidney disease: Secondary | ICD-10-CM | POA: Diagnosis not present

## 2019-02-04 DIAGNOSIS — Z992 Dependence on renal dialysis: Secondary | ICD-10-CM | POA: Diagnosis not present

## 2019-02-04 DIAGNOSIS — N186 End stage renal disease: Secondary | ICD-10-CM | POA: Diagnosis not present

## 2019-02-04 DIAGNOSIS — M79671 Pain in right foot: Secondary | ICD-10-CM | POA: Diagnosis not present

## 2019-02-04 DIAGNOSIS — Z6824 Body mass index (BMI) 24.0-24.9, adult: Secondary | ICD-10-CM | POA: Diagnosis not present

## 2019-02-04 DIAGNOSIS — N289 Disorder of kidney and ureter, unspecified: Secondary | ICD-10-CM | POA: Insufficient documentation

## 2019-02-05 ENCOUNTER — Ambulatory Visit: Payer: Medicaid Other | Admitting: Podiatry

## 2019-02-05 ENCOUNTER — Ambulatory Visit (INDEPENDENT_AMBULATORY_CARE_PROVIDER_SITE_OTHER): Payer: Medicaid Other

## 2019-02-05 ENCOUNTER — Other Ambulatory Visit: Payer: Self-pay

## 2019-02-05 ENCOUNTER — Other Ambulatory Visit: Payer: Self-pay | Admitting: Podiatry

## 2019-02-05 ENCOUNTER — Encounter: Payer: Self-pay | Admitting: Podiatry

## 2019-02-05 VITALS — Temp 97.9°F

## 2019-02-05 DIAGNOSIS — M7751 Other enthesopathy of right foot: Secondary | ICD-10-CM | POA: Diagnosis not present

## 2019-02-05 DIAGNOSIS — M79674 Pain in right toe(s): Secondary | ICD-10-CM

## 2019-02-05 DIAGNOSIS — L989 Disorder of the skin and subcutaneous tissue, unspecified: Secondary | ICD-10-CM

## 2019-02-12 NOTE — Progress Notes (Signed)
   Subjective: 48 y.o. male presenting to the office today as a new patient with a chief complaint of worsening sharp pain noted to the right fifth digit that began gradually two weeks ago. Walking increases the pain. He has not done anything for treatment. He denies any known trauma or injury. Patient is here for further evaluation and treatment.   Past Medical History:  Diagnosis Date  . Chronic kidney disease    patient on dialysis tues-thurs-sat  . Cirrhosis of liver ([place])   . Drug addiction ([place])   . ETOH abuse   . Hepatitis   . Hypertension   . Renal disorder renal failure  . Sleep apnea   . Systolic and diastolic CHF, 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[place]) 04/19/2013     Objective:  Physical Exam General: Alert and oriented x3 in no acute distress  Dermatology: Hyperkeratotic lesion(s) present on the right fifth toe. Pain on palpation with a central nucleated core noted. Skin is warm, dry and supple bilateral lower extremities. Negative for open lesions or macerations.  Vascular: Palpable pedal pulses bilaterally. No edema or erythema noted. Capillary refill within normal limits.  Neurological: Epicritic and protective threshold grossly intact bilaterally.   Musculoskeletal Exam: Pain on palpation at the keratotic lesion(s) noted as well as the right fifth toe. Range of motion within normal limits bilateral. Muscle strength 5/5 in all groups bilateral.  Radiographic Exam:  Normal osseous mineralization. Joint spaces preserved. No fracture/dislocation/boney destruction.    Assessment: 1. Right fifth toe capsulitis  2. Pre-ulcerative callus lesion noted to the right fifth toe   Plan of Care:  1. Patient evaluated. X-Rays reviewed.  2. Excisional debridement of keratoic lesion using a chisel blade was performed without incident.  3. Dressed area with light dressing. 4. Injection of 0.5 mLs Celestone Soluspan injected into the right fifth toe.  5. Patient is to return to the clinic  PRN.   Edrick Kins, DPM [facility]  Dr. Edrick Kins, [address]                                        [place], [place] [address]                Office [phone]  Fax [phone]

## 2019-02-26 DIAGNOSIS — N2581 Secondary hyperparathyroidism of renal origin: Secondary | ICD-10-CM | POA: Diagnosis not present

## 2019-02-26 DIAGNOSIS — N186 End stage renal disease: Secondary | ICD-10-CM | POA: Diagnosis not present

## 2019-03-07 DIAGNOSIS — Z992 Dependence on renal dialysis: Secondary | ICD-10-CM | POA: Diagnosis not present

## 2019-03-07 DIAGNOSIS — I129 Hypertensive chronic kidney disease with stage 1 through stage 4 chronic kidney disease, or unspecified chronic kidney disease: Secondary | ICD-10-CM | POA: Diagnosis not present

## 2019-03-07 DIAGNOSIS — N186 End stage renal disease: Secondary | ICD-10-CM | POA: Diagnosis not present

## 2019-03-12 DIAGNOSIS — N186 End stage renal disease: Secondary | ICD-10-CM | POA: Diagnosis not present

## 2019-03-12 DIAGNOSIS — N2581 Secondary hyperparathyroidism of renal origin: Secondary | ICD-10-CM | POA: Diagnosis not present

## 2019-04-07 DIAGNOSIS — I129 Hypertensive chronic kidney disease with stage 1 through stage 4 chronic kidney disease, or unspecified chronic kidney disease: Secondary | ICD-10-CM | POA: Diagnosis not present

## 2019-04-07 DIAGNOSIS — N186 End stage renal disease: Secondary | ICD-10-CM | POA: Diagnosis not present

## 2019-04-07 DIAGNOSIS — Z992 Dependence on renal dialysis: Secondary | ICD-10-CM | POA: Diagnosis not present

## 2019-04-09 DIAGNOSIS — N186 End stage renal disease: Secondary | ICD-10-CM | POA: Diagnosis not present

## 2019-04-09 DIAGNOSIS — N2581 Secondary hyperparathyroidism of renal origin: Secondary | ICD-10-CM | POA: Diagnosis not present

## 2019-05-07 DIAGNOSIS — I129 Hypertensive chronic kidney disease with stage 1 through stage 4 chronic kidney disease, or unspecified chronic kidney disease: Secondary | ICD-10-CM | POA: Diagnosis not present

## 2019-05-07 DIAGNOSIS — N186 End stage renal disease: Secondary | ICD-10-CM | POA: Diagnosis not present

## 2019-05-07 DIAGNOSIS — Z992 Dependence on renal dialysis: Secondary | ICD-10-CM | POA: Diagnosis not present

## 2019-05-16 DIAGNOSIS — N186 End stage renal disease: Secondary | ICD-10-CM | POA: Diagnosis not present

## 2019-05-16 DIAGNOSIS — N2581 Secondary hyperparathyroidism of renal origin: Secondary | ICD-10-CM | POA: Diagnosis not present

## 2019-05-27 ENCOUNTER — Ambulatory Visit
Admission: RE | Admit: 2019-05-27 | Discharge: 2019-05-27 | Disposition: A | Discharge status: Home / Self Care | Payer: Medicaid Other | Source: Ambulatory Visit | Attending: Nephrology | Admitting: Nephrology

## 2019-05-27 ENCOUNTER — Other Ambulatory Visit: Payer: Self-pay | Admitting: Nephrology

## 2019-05-27 DIAGNOSIS — M7989 Other specified soft tissue disorders: Secondary | ICD-10-CM | POA: Diagnosis not present

## 2019-05-27 DIAGNOSIS — R609 Edema, unspecified: Secondary | ICD-10-CM

## 2019-05-27 DIAGNOSIS — R52 Pain, unspecified: Secondary | ICD-10-CM

## 2019-05-27 DIAGNOSIS — M79661 Pain in right lower leg: Secondary | ICD-10-CM | POA: Diagnosis not present

## 2019-05-27 IMAGING — CR DG TIBIA/FIBULA 2V*R*
4 series · 4 of 4 positions shown · non-contrast
Comparison: Foot exam of [DATE]

CLINICAL DATA: Tibial swelling and pain

EXAM:
RIGHT TIBIA AND FIBULA - 2 VIEW

[t tib/fib ap right (1 of 2)]
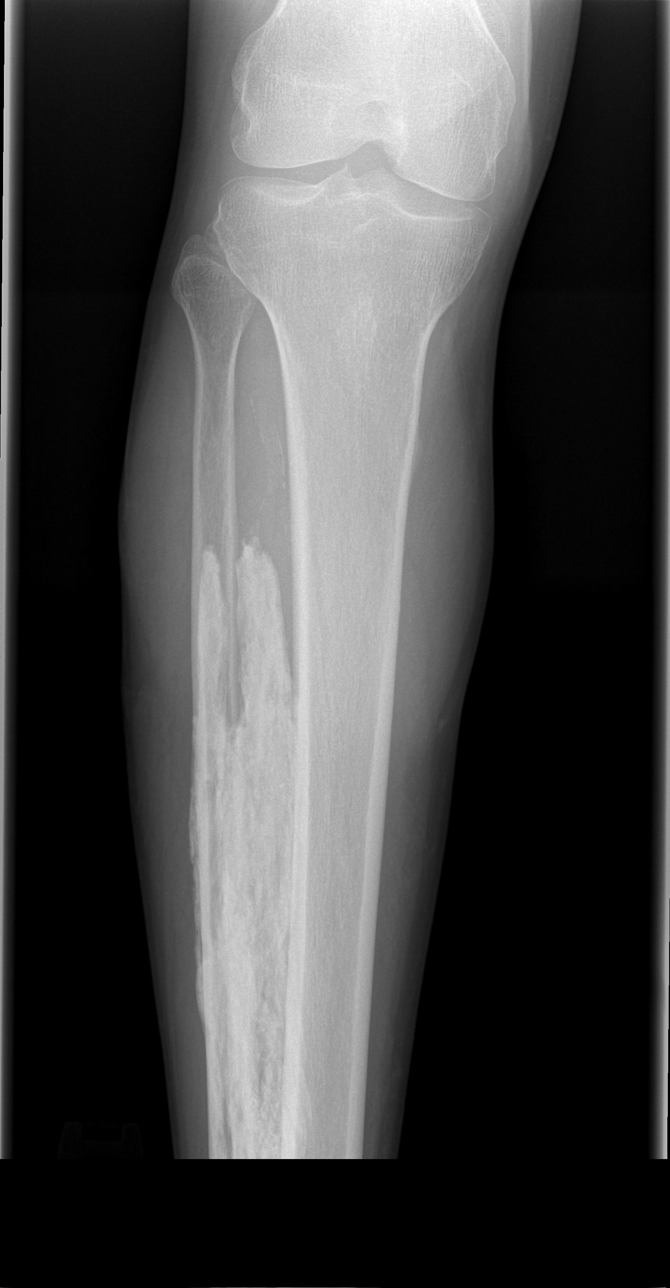

[t tib/fib ap right (2 of 2)]
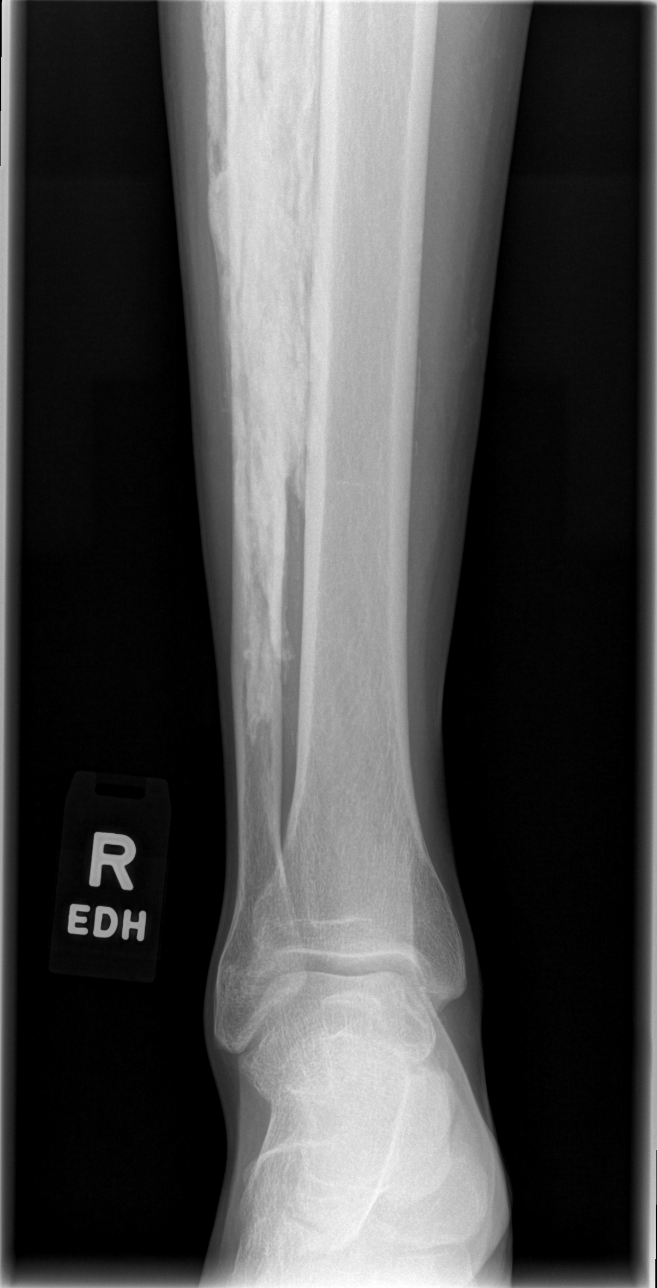

[t tib/fib lat right (1 of 2)]
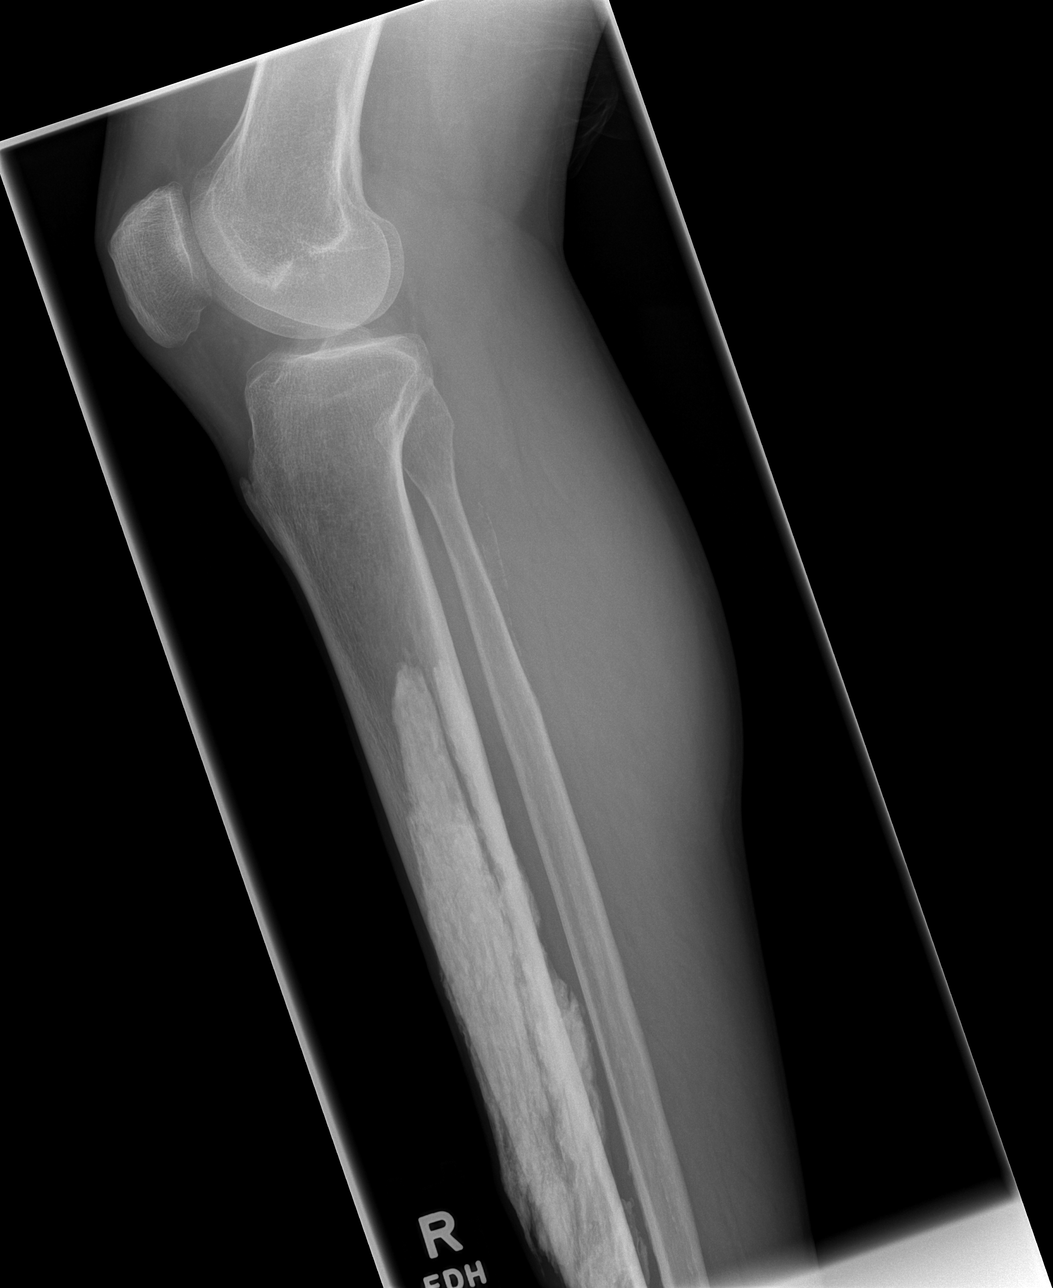

[t tib/fib lat right (2 of 2)]
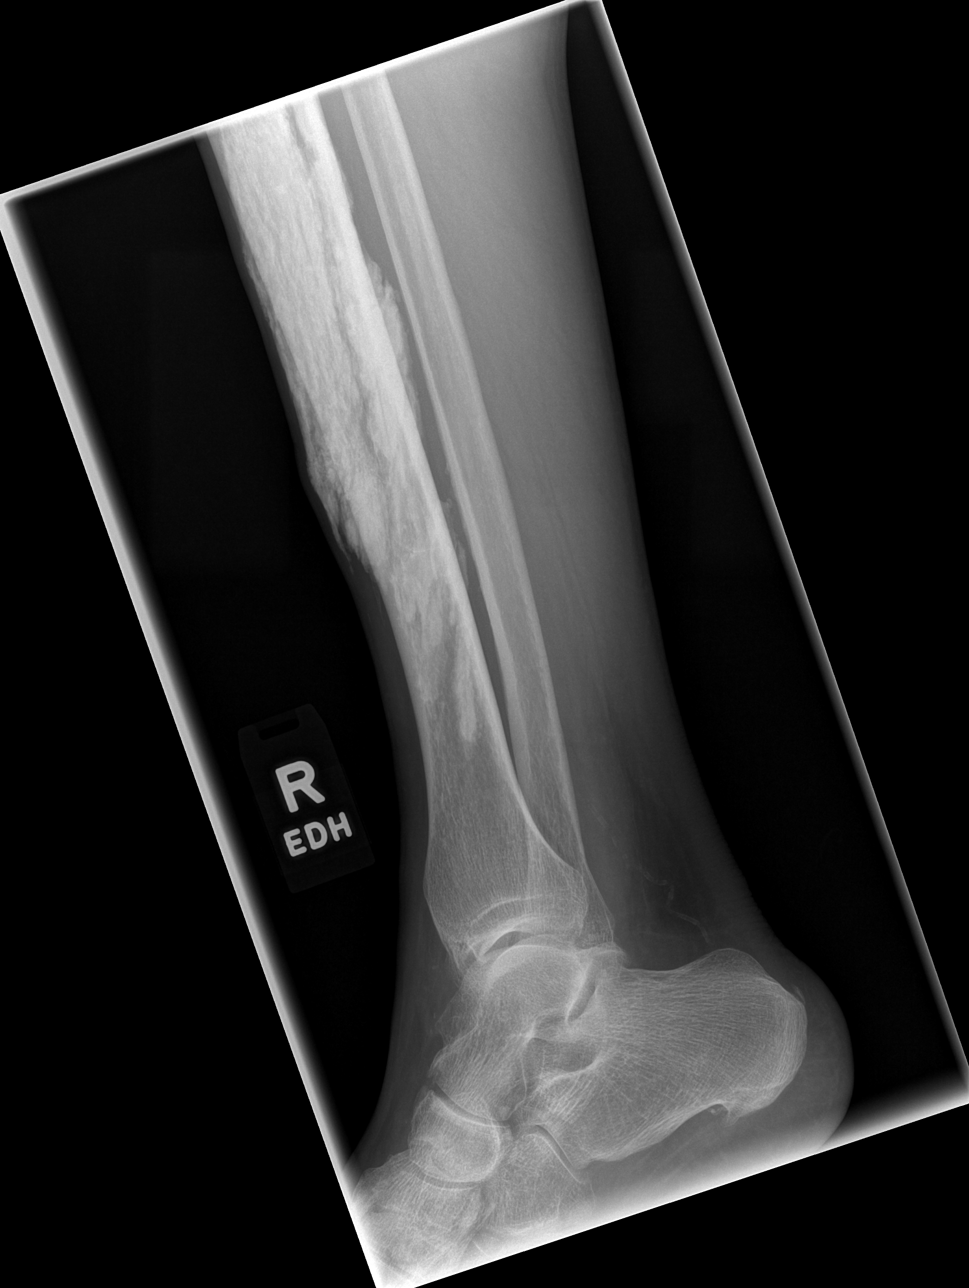

[4 of 4 positions shown; findings below may reference images not displayed]

FINDINGS: There is calcification extending along the expected course of
anterior tibial musculature or intraosseous ligament. This shows a
well-defined margin and there is no underlying periosteal reaction
or acute bone process. Signs of vascular disease are noted along the
course of popliteal artery and distally into the lower extremity.

Soft tissues are otherwise unremarkable aside from some swelling
overlying the calcific changes.
IMPRESSION: 1. No acute bone finding.
2. Calcific changes potentially related to myonecrosis. Could also
be seen in the setting of syndesmotic calcification in the setting
of prior trauma.

## 2019-06-07 DIAGNOSIS — Z992 Dependence on renal dialysis: Secondary | ICD-10-CM | POA: Diagnosis not present

## 2019-06-07 DIAGNOSIS — I129 Hypertensive chronic kidney disease with stage 1 through stage 4 chronic kidney disease, or unspecified chronic kidney disease: Secondary | ICD-10-CM | POA: Diagnosis not present

## 2019-06-07 DIAGNOSIS — N186 End stage renal disease: Secondary | ICD-10-CM | POA: Diagnosis not present

## 2019-06-11 DIAGNOSIS — N186 End stage renal disease: Secondary | ICD-10-CM | POA: Diagnosis not present

## 2019-06-11 DIAGNOSIS — N2581 Secondary hyperparathyroidism of renal origin: Secondary | ICD-10-CM | POA: Diagnosis not present

## 2019-07-07 DIAGNOSIS — N186 End stage renal disease: Secondary | ICD-10-CM | POA: Diagnosis not present

## 2019-07-07 DIAGNOSIS — I129 Hypertensive chronic kidney disease with stage 1 through stage 4 chronic kidney disease, or unspecified chronic kidney disease: Secondary | ICD-10-CM | POA: Diagnosis not present

## 2019-07-07 DIAGNOSIS — Z992 Dependence on renal dialysis: Secondary | ICD-10-CM | POA: Diagnosis not present

## 2019-07-11 DIAGNOSIS — N2581 Secondary hyperparathyroidism of renal origin: Secondary | ICD-10-CM | POA: Diagnosis not present

## 2019-07-11 DIAGNOSIS — N186 End stage renal disease: Secondary | ICD-10-CM | POA: Diagnosis not present

## 2019-08-07 DIAGNOSIS — I129 Hypertensive chronic kidney disease with stage 1 through stage 4 chronic kidney disease, or unspecified chronic kidney disease: Secondary | ICD-10-CM | POA: Diagnosis not present

## 2019-08-07 DIAGNOSIS — Z992 Dependence on renal dialysis: Secondary | ICD-10-CM | POA: Diagnosis not present

## 2019-08-07 DIAGNOSIS — N186 End stage renal disease: Secondary | ICD-10-CM | POA: Diagnosis not present

## 2019-08-14 ENCOUNTER — Inpatient Hospital Stay (HOSPITAL_COMMUNITY): Admission: RE | Admit: 2019-08-14 | Payer: Medicaid Other | Source: Ambulatory Visit

## 2019-08-14 ENCOUNTER — Encounter: Payer: Self-pay | Admitting: Vascular Surgery

## 2019-08-14 ENCOUNTER — Other Ambulatory Visit: Payer: Self-pay

## 2019-08-14 ENCOUNTER — Ambulatory Visit (INDEPENDENT_AMBULATORY_CARE_PROVIDER_SITE_OTHER): Payer: Medicaid Other | Admitting: Vascular Surgery

## 2019-08-14 VITALS — BP 157/107 | HR 89 | Temp 98.4°F | Resp 18 | Ht 72.0 in | Wt 187.0 lb

## 2019-08-14 DIAGNOSIS — L97909 Non-pressure chronic ulcer of unspecified part of unspecified lower leg with unspecified severity: Secondary | ICD-10-CM

## 2019-08-14 DIAGNOSIS — I70299 Other atherosclerosis of native arteries of extremities, unspecified extremity: Secondary | ICD-10-CM | POA: Diagnosis not present

## 2019-08-14 DIAGNOSIS — Z992 Dependence on renal dialysis: Secondary | ICD-10-CM | POA: Diagnosis not present

## 2019-08-14 DIAGNOSIS — N186 End stage renal disease: Secondary | ICD-10-CM | POA: Diagnosis not present

## 2019-08-14 NOTE — Progress Notes (Signed)
REASON FOR CONSULT:    Left great toe ischemia.  The consult is requested by Dr. Madelon Lips.  ASSESSMENT & PLAN:   PERIPHERAL VASCULAR DISEASE: Currently I do not see any wounds on the feet.  He has palpable pedal pulses with biphasic Doppler signals in both feet.  I do not think any further work-up is indicated at this time to evaluate his peripheral vascular disease.  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  We discussed possibly plicating 1 of these aneurysms however he is reluctant to consider this given that he really has not had any problems with the fistula recently.  I encouraged him to try not to cannulate near these areas and to keep the skin well lubricated.  If the areas of concern progress we would need to consider plication of at least 1 of those at a time.  I have also discussed the potential option of placing new access in the other arm given all the issues described below in his left arm fistula.  However he would need full evaluation given that he tells me the fistula on the right was ligated for unknown reasons.  HYPERTENSION: The patient's initial blood pressure today was elevated. We repeated this and this was still elevated. We have encouraged the patient to follow up with their primary care physician for management of their blood pressure.   Deitra Mayo, MD Office: [phone]   HPI:   Patrick Anthony is a pleasant 49 y.o. male, referred with an ischemic left great toe.  On my history the patient states that he gets phosphorus deposits and that he has previously had this on his right pretibial area and more recently on his left great toe.  He does not have any wounds that he is aware of.  He denies any history of claudication, rest pain, or nonhealing ulcers.  His risk factors for peripheral vascular disease include hypertension.  He denies any history  of diabetes, hypercholesterolemia, family history of premature cardiovascular disease or tobacco use.  He does have poorly controlled blood pressure.  He has a left upper arm fistula which has had for 4 to 5 years.  He tells me that he had a fistula in the right upper arm that was ligated.  He is not sure why it was ligated.  He dialyzes on Monday Wednesdays and Fridays.  Of note I did review the notes from 12/12/2016.  He was seen by Dr. Sherren Mocha Early.  The patient's fistula has been created in 2015.  In 2016 he underwent stenting of the cephalic subclavian junction in [facility].  He also had some angioplasty by [facility].  Thus, this fistula has multiple issues.  Past Medical History:  Diagnosis Date  . Chronic kidney disease    patient on dialysis tues-thurs-sat  . Cirrhosis of liver ([place])   . Drug addiction ([place])   . ETOH abuse   . Hepatitis   . Hypertension   . Renal disorder renal failure  . Sleep apnea   . Systolic and diastolic CHF, 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[place]) 04/19/2013    Family History  Problem Relation Age of Onset  . Hypertension Mother   . Cancer Mother        breast    SOCIAL HISTORY: Social History   Socioeconomic History  . Marital status: Single    Spouse name: Not  on file  . Number of children: Not on file  . Years of education: Not on file  . Highest education level: Not on file  Occupational History  . Not on file  Tobacco Use  . Smoking status: Light Tobacco Smoker    Packs/day: 0.20    Types: Cigarettes  . Smokeless tobacco: Never Used  Substance and Sexual Activity  . Alcohol use: No  . Drug use: No    Comment: Quit  . Sexual activity: Not on file  Other Topics Concern  . Not on file  Social History Narrative   ** Merged History Encounter **       Social Determinants of Health   Financial Resource Strain:   . Difficulty of Paying Living Expenses: Not on file  Food Insecurity:   . Worried About Charity fundraiser in the Last  Year: Not on file  . Ran Out of Food in the Last Year: Not on file  Transportation Needs:   . Lack of Transportation (Medical): Not on file  . Lack of Transportation (Non-Medical): Not on file  Physical Activity:   . Days of Exercise per Week: Not on file  . Minutes of Exercise per Session: Not on file  Stress:   . Feeling of Stress : Not on file  Social Connections:   . Frequency of Communication with Friends and Family: Not on file  . Frequency of Social Gatherings with Friends and Family: Not on file  . Attends Religious Services: Not on file  . Active Member of Clubs or Organizations: Not on file  . Attends Archivist Meetings: Not on file  . Marital Status: Not on file  Intimate Partner Violence:   . Fear of Current or Ex-Partner: Not on file  . Emotionally Abused: Not on file  . Physically Abused: Not on file  . Sexually Abused: Not on file    No Known Allergies  Current Outpatient Medications  Medication Sig Dispense Refill  . amLODipine (NORVASC) 10 MG tablet Take 1 tablet (10 mg total) by mouth at bedtime. 30 tablet 5  . B Complex-C-Folic Acid (DIALYVITE Q000111Q) 0.8 MG TABS Take 1 tablet by mouth daily.    . calcium acetate (PHOSLO) 667 MG capsule Take 3 capsules (2,001 mg total) by mouth 3 (three) times daily with meals. 270 capsule 5  . cloNIDine (CATAPRES) 0.2 MG tablet Take 0.2 mg by mouth daily.    . hydrALAZINE (APRESOLINE) 50 MG tablet TAKE 1 AND 1/2 TABLETS BY MOUTH THREE TIMES DAILY    . metoprolol tartrate (LOPRESSOR) 25 MG tablet Take 25 mg by mouth daily.   3  . nitroGLYCERIN (NITROSTAT) 0.4 MG SL tablet Place 0.4 mg under the tongue every 5 (five) minutes as needed for chest pain.     No current facility-administered medications for this visit.    REVIEW OF SYSTEMS:  [X]  denotes positive finding, [ ]  denotes negative finding Cardiac  Comments:  Chest pain or chest pressure:    Shortness of breath upon exertion:    Short of breath when lying  flat:    Irregular heart rhythm:        Vascular    Pain in calf, thigh, or hip brought on by ambulation:    Pain in feet at night that wakes you up from your sleep:     Blood clot in your veins:    Leg swelling:         Pulmonary    Oxygen  at home:    Productive cough:     Wheezing:         Neurologic    Sudden weakness in arms or legs:     Sudden numbness in arms or legs:     Sudden onset of difficulty speaking or slurred speech:    Temporary loss of vision in one eye:     Problems with dizziness:         Gastrointestinal    Blood in stool:     Vomited blood:         Genitourinary    Burning when urinating:     Blood in urine:        Psychiatric    Major depression:         Hematologic    Bleeding problems:    Problems with blood clotting too easily:        Skin    Rashes or ulcers:        Constitutional    Fever or chills:     PHYSICAL EXAM:   Vitals:   08/14/19 0821  BP: (!) 157/107  Pulse: 89  Resp: 18  Temp: 98.4 F (36.9 C)  TempSrc: Temporal  SpO2: 99%  Weight: 187 lb (84.8 kg)  Height: 6' (1.829 m)    GENERAL: The patient is a well-nourished male, in no acute distress. The vital signs are documented above. CARDIAC: There is a regular rate and rhythm.  VASCULAR: I do not detect carotid bruits. On the right side he has a palpable femoral, popliteal, dorsalis pedis, and posterior tibial pulse. On the left side he has a palpable femoral, popliteal, and a diminished dorsalis pedis and posterior tibial pulse. I was able to obtain a biphasic dorsalis pedis and posterior tibial signals bilaterally. He has no significant lower extremity swelling. His left upper arm fistula is degenerative.  He has 2 larger aneurysms with some thinning of skin overlying the aneurysm.    PULMONARY: There is good air exchange bilaterally without wheezing or rales. ABDOMEN: Soft and non-tender with normal pitched bowel sounds.  MUSCULOSKELETAL: There are no major  deformities or cyanosis. NEUROLOGIC: No focal weakness or paresthesias are detected. SKIN: There are no ulcers or rashes noted. PSYCHIATRIC: The patient has a normal affect.  DATA:    I do not see any recent labs.  A total of 45 minutes was spent on this visit. 23 minutes was face to face time. More than 50% of the time was spent on counseling and coordinating with the patient.

## 2019-08-15 DIAGNOSIS — N186 End stage renal disease: Secondary | ICD-10-CM | POA: Diagnosis not present

## 2019-08-15 DIAGNOSIS — S41102A Unspecified open wound of left upper arm, initial encounter: Secondary | ICD-10-CM | POA: Diagnosis not present

## 2019-08-15 DIAGNOSIS — N2581 Secondary hyperparathyroidism of renal origin: Secondary | ICD-10-CM | POA: Diagnosis not present

## 2019-08-18 DIAGNOSIS — L02419 Cutaneous abscess of limb, unspecified: Secondary | ICD-10-CM | POA: Diagnosis not present

## 2019-08-22 ENCOUNTER — Encounter: Payer: Self-pay | Admitting: General Practice

## 2019-09-02 ENCOUNTER — Ambulatory Visit: Payer: Medicaid Other | Admitting: Vascular Surgery

## 2019-09-02 ENCOUNTER — Encounter (HOSPITAL_COMMUNITY): Payer: Medicaid Other

## 2019-09-07 DIAGNOSIS — I129 Hypertensive chronic kidney disease with stage 1 through stage 4 chronic kidney disease, or unspecified chronic kidney disease: Secondary | ICD-10-CM | POA: Diagnosis not present

## 2019-09-07 DIAGNOSIS — Z992 Dependence on renal dialysis: Secondary | ICD-10-CM | POA: Diagnosis not present

## 2019-09-07 DIAGNOSIS — N186 End stage renal disease: Secondary | ICD-10-CM | POA: Diagnosis not present

## 2019-09-08 DIAGNOSIS — Z992 Dependence on renal dialysis: Secondary | ICD-10-CM | POA: Diagnosis not present

## 2019-09-08 DIAGNOSIS — N186 End stage renal disease: Secondary | ICD-10-CM | POA: Diagnosis not present

## 2019-09-08 DIAGNOSIS — N2581 Secondary hyperparathyroidism of renal origin: Secondary | ICD-10-CM | POA: Diagnosis not present

## 2019-09-08 NOTE — Progress Notes (Signed)
Cardiology Office Note    Date:  09/09/2019   ID:  Patrick Anthony, DOB 1971/06/27, MRN [id number]  PCP:  [name], MD  Cardiologist: Fransico Him, MD EPS: None  Chief Complaint  Patient presents with  . Chest Pain    History of Present Illness:  Patrick Anthony is a 49 y.o. male with history of ESRD on HD, liver cirrhosis with hepatitis, polysubstance abuse with drug addiction and EtOH addiction, hypertension, chronic combined systolic and diastolic CHF.  Echo in 2014 LVEF 40 to 45% with moderate LVH, grade 2 DD, mild AR/MR and mild to moderate TR.  Patient saw Dr. Radford Pax 04/2017 because of chest pain while on dialysis.  Blood pressure would shoot up but better controlled on amlodipine.  NST 05/24/2017 low risk study with no ischemia.  EF is 46% but 2D echo same day showed normal LVEF 55 to 60% with grade 1 DD mild mitral regurgitation and moderately dilated left atrium.  Patient comes in today with recurrent chest pain during dialysis. An hour into dialysis he gets chest tightness that gets worse. They turn the machine off for 10 min and give him saline and then pain is mild but present. No regular exercise, no problem going up stairs or on non dialysis days. No alcohol, tobacco or substance abuse.BP runs high at dialysis-over 200. BP good today.  Not on amlodipine any more-doesn't know why.    Past Medical History:  Diagnosis Date  . Chronic kidney disease    patient on dialysis tues-thurs-sat  . Cirrhosis of liver ([facility])   . Drug addiction ([employer])   . ETOH abuse   . Hepatitis   . Hypertension   . Renal disorder renal failure  . Sleep apnea   . Systolic and diastolic CHF, 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[place]) 04/19/2013    Past Surgical History:  Procedure Laterality Date  . AV FISTULA PLACEMENT Right   . AV FISTULA PLACEMENT Left 09/03/2013   Procedure: ARTERIOVENOUS (AV) FISTULA CREATION - LEFT BRACHIOCEPHALIC;  Surgeon: Conrad Eureka, MD;  Location: [facility];  Service: Vascular;  Laterality:  Left;  . INSERTION OF DIALYSIS CATHETER N/A 08/29/2013   Procedure: INSERTION OF DIALYSIS CATHETER;  Surgeon: Mal Misty, MD;  Location: [facility];  Service: Vascular;  Laterality: N/A;  . PERIPHERAL VASCULAR CATHETERIZATION N/A 01/06/2015   Procedure: A/V Shuntogram/Fistulagram;  Surgeon: Katha Cabal, MD;  Location: [facility];  Service: Cardiovascular;  Laterality: N/A;  . PERIPHERAL VASCULAR CATHETERIZATION Left 01/06/2015   Procedure: A/V Shunt Intervention;  Surgeon: Katha Cabal, MD;  Location: [facility];  Service: Cardiovascular;  Laterality: Left;    Current Medications: Current Meds  Medication Sig  . calcium acetate (PHOSLO) 667 MG capsule Take 3 capsules (2,001 mg total) by mouth 3 (three) times daily with meals.  . cloNIDine (CATAPRES) 0.2 MG tablet Take 0.2 mg by mouth daily.  . hydrALAZINE (APRESOLINE) 50 MG tablet TAKE 1 AND 1/2 TABLETS BY MOUTH THREE TIMES DAILY  . metoprolol tartrate (LOPRESSOR) 25 MG tablet Take 25 mg by mouth daily.   . nitroGLYCERIN (NITROSTAT) 0.4 MG SL tablet Place 0.4 mg under the tongue every 5 (five) minutes as needed for chest pain.  . [DISCONTINUED] amLODipine (NORVASC) 10 MG tablet Take 1 tablet (10 mg total) by mouth at bedtime.     Allergies:   Patient has no known allergies.   Social History   Socioeconomic History  . Marital status: Single    Spouse  name: Not on file  . Number of children: Not on file  . Years of education: Not on file  . Highest education level: Not on file  Occupational History  . Not on file  Tobacco Use  . Smoking status: Light Tobacco Smoker    Packs/day: 0.20    Types: Cigarettes  . Smokeless tobacco: Never Used  Substance and Sexual Activity  . Alcohol use: No  . Drug use: No    Comment: Quit  . Sexual activity: Not on file  Other Topics Concern  . Not on file  Social History Narrative   ** Merged History Encounter **       Social Determinants of Health   Financial  Resource Strain:   . Difficulty of Paying Living Expenses: Not on file  Food Insecurity:   . Worried About Charity fundraiser in the Last Year: Not on file  . Ran Out of Food in the Last Year: Not on file  Transportation Needs:   . Lack of Transportation (Medical): Not on file  . Lack of Transportation (Non-Medical): Not on file  Physical Activity:   . Days of Exercise per Week: Not on file  . Minutes of Exercise per Session: Not on file  Stress:   . Feeling of Stress : Not on file  Social Connections:   . Frequency of Communication with Friends and Family: Not on file  . Frequency of Social Gatherings with Friends and Family: Not on file  . Attends Religious Services: Not on file  . Active Member of Clubs or Organizations: Not on file  . Attends Archivist Meetings: Not on file  . Marital Status: Not on file     Family History:  The patient's  family history includes Cancer in his mother; Hypertension in his mother.   ROS:   Please see the history of present illness.    ROS All other systems reviewed and are negative.   PHYSICAL EXAM:   VS:  BP 110/76   Pulse 84   Ht 6' (1.829 m)   Wt 188 lb (85.3 kg)   BMI 25.50 kg/m   Physical Exam  GEN: Well nourished, well developed, in no acute distress  Neck: no JVD, carotid bruits, or masses Cardiac:RRR; no murmurs, rubs, or gallops  Respiratory:  clear to auscultation bilaterally, normal work of breathing GI: soft, nontender, nondistended, + BS Ext: without cyanosis, clubbing, or edema, Good distal pulses bilaterally Neuro:  Alert and Oriented x 3 Psych: euthymic mood, full affect  Wt Readings from Last 3 Encounters:  09/09/19 188 lb (85.3 kg)  08/14/19 187 lb (84.8 kg)  11/26/18 183 lb (83 kg)      Studies/Labs Reviewed:   EKG:  EKG is  ordered today.  The ekg ordered today demonstrates normal sinus rhythm with first-degree AV block, LVH, lateral T wave abnormality unchanged from prior tracings  Recent  Labs: No results found for requested labs within last 8760 hours.   Lipid Panel    Component Value Date/Time   CHOL 165 08/28/2013 2107   TRIG 126 08/28/2013 2107   HDL 32 (L) 08/28/2013 2107   CHOLHDL 5.2 08/28/2013 2107   VLDL 25 08/28/2013 2107   LDLCALC 108 (H) 08/28/2013 2107    Additional studies/ records that were reviewed today include:  2D echo 10/18/2018Study Conclusions   - Left ventricle: The cavity size was normal. Wall thickness was    increased in a pattern of moderate LVH. Systolic function  was    normal. The estimated ejection fraction was in the range of 55%    to 60%. Wall motion was normal; there were no regional wall    motion abnormalities. Doppler parameters are consistent with    abnormal left ventricular relaxation (grade 1 diastolic    dysfunction). Doppler parameters are consistent with    indeterminate ventricular filling pressure.  - Aortic valve: Transvalvular velocity was within the normal range.    There was no stenosis. There was trivial regurgitation.  - Mitral valve: Transvalvular velocity was within the normal range.    There was no evidence for stenosis. There was mild regurgitation.  - Left atrium: The atrium was moderately dilated.  - Right ventricle: The cavity size was normal. Wall thickness was    normal. Systolic function was normal.  - Right atrium: The atrium was severely dilated.  - Tricuspid valve: There was no regurgitation.    NST 05/24/2017  Nuclear stress EF: 46%.  This is a low risk study.   Normal pharmacologic nuclear stress test with no evidence for prior infarct or ischemia.  LVEF appears better than calculated. A correlation with an echocardiogram is recommended.       ASSESSMENT:    1. Chest pain, unspecified type   2. Essential hypertension   3. Chronic diastolic CHF (congestive heart failure) ([place])   4. ESRD on dialysis [facility])      PLAN:  In order of problems listed above:  History of chest pain  while on dialysis in 2018 when blood pressure will go up with normal nuclear stress test at that time.  Having recurrent chest pain only while on dialysis and he says his blood pressures get as high as 200/160.  Machine 10 minutes and give him saline.  Today's blood pressure is completely normal.  He says there is always been a big fluctuation but generally it stays high.  He used to be on amlodipine 10 mg daily but somebody stopped this. Will check NST to rule out ischemia.  Add amlodipine 5 mg once daily.  Essential hypertension blood pressure normal today but running very high at dialysis.  Will restart amlodipine at 5 mg daily.  2 g sodium diet.  Chronic diastolic CHF previous LV dysfunction 2014 but echo 2018 LVEF normal with moderate LVH and grade 2 DD fluid status managed with dialysis.  We will recheck echo.  Follow-up with Dr. Radford Pax  ESRD on HD  Medication Adjustments/Labs and Tests Ordered: Current medicines are reviewed at length with the patient today.  Concerns regarding medicines are outlined above.  Medication changes, Labs and Tests ordered today are listed in the Patient Instructions below. There are no Patient Instructions on file for this visit.   Sumner Boast, PA-C  09/09/2019 10:04 AM    [facility] [address], [place], [place]  [address] Phone: [phone]; Fax: [phone]

## 2019-09-09 ENCOUNTER — Other Ambulatory Visit: Payer: Self-pay

## 2019-09-09 ENCOUNTER — Ambulatory Visit: Payer: Medicaid Other | Admitting: Physician Assistant

## 2019-09-09 ENCOUNTER — Ambulatory Visit: Payer: Medicaid Other | Admitting: Vascular Surgery

## 2019-09-09 ENCOUNTER — Encounter: Payer: Self-pay | Admitting: Surgery

## 2019-09-09 ENCOUNTER — Encounter: Payer: Self-pay | Admitting: Nurse Practitioner

## 2019-09-09 ENCOUNTER — Ambulatory Visit (HOSPITAL_COMMUNITY): Admission: RE | Admit: 2019-09-09 | Payer: Medicaid Other | Source: Ambulatory Visit

## 2019-09-09 ENCOUNTER — Ambulatory Visit (HOSPITAL_COMMUNITY): Payer: Medicaid Other

## 2019-09-09 ENCOUNTER — Encounter: Payer: Self-pay | Admitting: Physician Assistant

## 2019-09-09 VITALS — BP 110/76 | HR 84 | Ht 72.0 in | Wt 188.0 lb

## 2019-09-09 DIAGNOSIS — Z992 Dependence on renal dialysis: Secondary | ICD-10-CM

## 2019-09-09 DIAGNOSIS — N186 End stage renal disease: Secondary | ICD-10-CM

## 2019-09-09 DIAGNOSIS — I5032 Chronic diastolic (congestive) heart failure: Secondary | ICD-10-CM

## 2019-09-09 DIAGNOSIS — R079 Chest pain, unspecified: Secondary | ICD-10-CM | POA: Diagnosis not present

## 2019-09-09 DIAGNOSIS — I10 Essential (primary) hypertension: Secondary | ICD-10-CM | POA: Diagnosis not present

## 2019-09-09 MED ORDER — AMLODIPINE BESYLATE 5 MG PO TABS: 5.0000 mg | ORAL_TABLET | Freq: Every day | ORAL | 3 refills | Status: DC

## 2019-09-09 NOTE — Patient Instructions (Addendum)
Medication Instructions:  Your physician has recommended you make the following change in your medication:  START Amlodipine (Norvasc) 5 mg one time daily at night  *If you need a refill on your cardiac medications before your next appointment, please call your pharmacy*  Lab Work: None Ordered If you have labs (blood work) drawn today and your tests are completely normal, you will receive your results only by: Marland Kitchen MyChart Message (if you have MyChart) OR . A paper copy in the mail If you have any lab test that is abnormal or we need to change your treatment, we will call you to review the results.   Testing/Procedures: Your physician has requested that you have a lexiscan myoview. For further information please visit [email or web address]. Please follow instruction sheet, as given.  Your physician has requested that you have an echocardiogram. Echocardiography is a painless test that uses sound waves to create images of your heart. It provides your doctor with information about the size and shape of your heart and how well your heart's chambers and valves are working. This procedure takes approximately one hour. There are no restrictions for this procedure.    Follow-Up: At [facility], you and your health needs are our priority.  As part of our continuing mission to provide you with exceptional heart care, we have created designated Provider Care Teams.  These Care Teams include your primary Cardiologist (physician) and Advanced Practice Providers (APPs -  Physician Assistants and Nurse Practitioners) who all work together to provide you with the care you need, when you need it.  Your next appointment:   6 week(s)  The format for your next appointment:   Either In Person or Virtual  Provider:   Fransico Him, MD  Other Instructions  DASH Eating Plan DASH stands for "Dietary Approaches to Stop Hypertension." The DASH eating plan is a healthy eating plan that has been shown to  reduce high blood pressure (hypertension). It may also reduce your risk for type 2 diabetes, heart disease, and stroke. The DASH eating plan may also help with weight loss. What are tips for following this plan?  General guidelines  Avoid eating more than 2,300 mg (milligrams) of salt (sodium) a day. If you have hypertension, you may need to reduce your sodium intake to 1,500 mg a day.  Limit alcohol intake to no more than 1 drink a day for nonpregnant women and 2 drinks a day for men. One drink equals 12 oz of beer, 5 oz of wine, or 1 oz of hard liquor.  Work with your health care provider to maintain a healthy body weight or to lose weight. Ask what an ideal weight is for you.  Get at least 30 minutes of exercise that causes your heart to beat faster (aerobic exercise) most days of the week. Activities may include walking, swimming, or biking.  Work with your health care provider or diet and nutrition specialist (dietitian) to adjust your eating plan to your individual calorie needs. Reading food labels   Check food labels for the amount of sodium per serving. Choose foods with less than 5 percent of the Daily Value of sodium. Generally, foods with less than 300 mg of sodium per serving fit into this eating plan.  To find whole grains, look for the word "whole" as the first word in the ingredient list. Shopping  Buy products labeled as "low-sodium" or "no salt added."  Buy fresh foods. Avoid canned foods and premade or frozen meals. Cooking  Avoid adding salt when cooking. Use salt-free seasonings or herbs instead of table salt or sea salt. Check with your health care provider or pharmacist before using salt substitutes.  Do not fry foods. Cook foods using healthy methods such as baking, boiling, grilling, and broiling instead.  Cook with heart-healthy oils, such as olive, canola, soybean, or sunflower oil. Meal planning  Eat a balanced diet that includes: ? 5 or more servings  of fruits and vegetables each day. At each meal, try to fill half of your plate with fruits and vegetables. ? Up to 6-8 servings of whole grains each day. ? Less than 6 oz of lean meat, poultry, or fish each day. A 3-oz serving of meat is about the same size as a deck of cards. One egg equals 1 oz. ? 2 servings of low-fat dairy each day. ? A serving of nuts, seeds, or beans 5 times each week. ? Heart-healthy fats. Healthy fats called Omega-3 fatty acids are found in foods such as flaxseeds and coldwater fish, like sardines, salmon, and mackerel.  Limit how much you eat of the following: ? Canned or prepackaged foods. ? Food that is high in trans fat, such as fried foods. ? Food that is high in saturated fat, such as fatty meat. ? Sweets, desserts, sugary drinks, and other foods with added sugar. ? Full-fat dairy products.  Do not salt foods before eating.  Try to eat at least 2 vegetarian meals each week.  Eat more home-cooked food and less restaurant, buffet, and fast food.  When eating at a restaurant, ask that your food be prepared with less salt or no salt, if possible. What foods are recommended? The items listed may not be a complete list. Talk with your dietitian about what dietary choices are best for you. Grains Whole-grain or whole-wheat bread. Whole-grain or whole-wheat pasta. Brown rice. [place] Morrow. Bulgur. Whole-grain and low-sodium cereals. Pita bread. Low-fat, low-sodium crackers. Whole-wheat flour tortillas. Vegetables Fresh or frozen vegetables (raw, steamed, roasted, or grilled). Low-sodium or reduced-sodium tomato and vegetable juice. Low-sodium or reduced-sodium tomato sauce and tomato paste. Low-sodium or reduced-sodium canned vegetables. Fruits All fresh, dried, or frozen fruit. Canned fruit in natural juice (without added sugar). Meat and other protein foods Skinless chicken or [place]. Ground chicken or [place]. Pork with fat trimmed off. Fish and seafood. Egg  whites. Dried beans, peas, or lentils. Unsalted nuts, nut butters, and seeds. Unsalted canned beans. Lean cuts of beef with fat trimmed off. Low-sodium, lean deli meat. Dairy Low-fat (1%) or fat-free (skim) milk. Fat-free, low-fat, or reduced-fat cheeses. Nonfat, low-sodium ricotta or cottage cheese. Low-fat or nonfat yogurt. Low-fat, low-sodium cheese. Fats and oils Soft margarine without trans fats. Vegetable oil. Low-fat, reduced-fat, or light mayonnaise and salad dressings (reduced-sodium). Canola, safflower, olive, soybean, and sunflower oils. Avocado. Seasoning and other foods Herbs. Spices. Seasoning mixes without salt. Unsalted popcorn and pretzels. Fat-free sweets. What foods are not recommended? The items listed may not be a complete list. Talk with your dietitian about what dietary choices are best for you. Grains Baked goods made with fat, such as croissants, muffins, or some breads. Dry pasta or rice meal packs. Vegetables Creamed or fried vegetables. Vegetables in a cheese sauce. Regular canned vegetables (not low-sodium or reduced-sodium). Regular canned tomato sauce and paste (not low-sodium or reduced-sodium). Regular tomato and vegetable juice (not low-sodium or reduced-sodium). Angie Fava. Olives. Fruits Canned fruit in a light or heavy syrup. Fried fruit. Fruit in cream or butter sauce. Meat  and other protein foods Fatty cuts of meat. Ribs. Fried meat. Berniece Salines. Sausage. Bologna and other processed lunch meats. Salami. Fatback. Hotdogs. Bratwurst. Salted nuts and seeds. Canned beans with added salt. Canned or smoked fish. Whole eggs or egg yolks. Chicken or [place] with skin. Dairy Whole or 2% milk, cream, and half-and-half. Whole or full-fat cream cheese. Whole-fat or sweetened yogurt. Full-fat cheese. Nondairy creamers. Whipped toppings. Processed cheese and cheese spreads. Fats and oils Butter. Stick margarine. Lard. Shortening. Ghee. Bacon fat. Tropical oils, such as coconut,  palm kernel, or palm oil. Seasoning and other foods Salted popcorn and pretzels. Onion salt, garlic salt, seasoned salt, table salt, and sea salt. Worcestershire sauce. Tartar sauce. Barbecue sauce. Teriyaki sauce. Soy sauce, including reduced-sodium. Steak sauce. Canned and packaged gravies. Fish sauce. Oyster sauce. Cocktail sauce. Horseradish that you find on the shelf. Ketchup. Mustard. Meat flavorings and tenderizers. Bouillon cubes. Hot sauce and Tabasco sauce. Premade or packaged marinades. Premade or packaged taco seasonings. Relishes. Regular salad dressings. Where to find more information:  National Heart, Lung, and [place]: https://wilson-eaton.com/  American Heart Association: www.heart.org Summary  The DASH eating plan is a healthy eating plan that has been shown to reduce high blood pressure (hypertension). It may also reduce your risk for type 2 diabetes, heart disease, and stroke.  With the DASH eating plan, you should limit salt (sodium) intake to 2,300 mg a day. If you have hypertension, you may need to reduce your sodium intake to 1,500 mg a day.  When on the DASH eating plan, aim to eat more fresh fruits and vegetables, whole grains, lean proteins, low-fat dairy, and heart-healthy fats.  Work with your health care provider or diet and nutrition specialist (dietitian) to adjust your eating plan to your individual calorie needs. This information is not intended to replace advice given to you by your health care provider. Make sure you discuss any questions you have with your health care provider. Document Revised: 07/06/2017 Document Reviewed: 07/17/2016 Elsevier Patient Education  2020 [employer].

## 2019-09-10 DIAGNOSIS — N2581 Secondary hyperparathyroidism of renal origin: Secondary | ICD-10-CM | POA: Diagnosis not present

## 2019-09-10 DIAGNOSIS — Z992 Dependence on renal dialysis: Secondary | ICD-10-CM | POA: Diagnosis not present

## 2019-09-10 DIAGNOSIS — N186 End stage renal disease: Secondary | ICD-10-CM | POA: Diagnosis not present

## 2019-09-12 DIAGNOSIS — N2581 Secondary hyperparathyroidism of renal origin: Secondary | ICD-10-CM | POA: Diagnosis not present

## 2019-09-12 DIAGNOSIS — Z992 Dependence on renal dialysis: Secondary | ICD-10-CM | POA: Diagnosis not present

## 2019-09-12 DIAGNOSIS — N186 End stage renal disease: Secondary | ICD-10-CM | POA: Diagnosis not present

## 2019-09-15 DIAGNOSIS — N186 End stage renal disease: Secondary | ICD-10-CM | POA: Diagnosis not present

## 2019-09-15 DIAGNOSIS — Z992 Dependence on renal dialysis: Secondary | ICD-10-CM | POA: Diagnosis not present

## 2019-09-15 DIAGNOSIS — N2581 Secondary hyperparathyroidism of renal origin: Secondary | ICD-10-CM | POA: Diagnosis not present

## 2019-09-17 DIAGNOSIS — Z992 Dependence on renal dialysis: Secondary | ICD-10-CM | POA: Diagnosis not present

## 2019-09-17 DIAGNOSIS — N186 End stage renal disease: Secondary | ICD-10-CM | POA: Diagnosis not present

## 2019-09-17 DIAGNOSIS — N2581 Secondary hyperparathyroidism of renal origin: Secondary | ICD-10-CM | POA: Diagnosis not present

## 2019-09-19 DIAGNOSIS — N186 End stage renal disease: Secondary | ICD-10-CM | POA: Diagnosis not present

## 2019-09-19 DIAGNOSIS — Z992 Dependence on renal dialysis: Secondary | ICD-10-CM | POA: Diagnosis not present

## 2019-09-19 DIAGNOSIS — N2581 Secondary hyperparathyroidism of renal origin: Secondary | ICD-10-CM | POA: Diagnosis not present

## 2019-09-22 DIAGNOSIS — Z992 Dependence on renal dialysis: Secondary | ICD-10-CM | POA: Diagnosis not present

## 2019-09-22 DIAGNOSIS — N186 End stage renal disease: Secondary | ICD-10-CM | POA: Diagnosis not present

## 2019-09-22 DIAGNOSIS — N2581 Secondary hyperparathyroidism of renal origin: Secondary | ICD-10-CM | POA: Diagnosis not present

## 2019-09-24 DIAGNOSIS — Z992 Dependence on renal dialysis: Secondary | ICD-10-CM | POA: Diagnosis not present

## 2019-09-24 DIAGNOSIS — N186 End stage renal disease: Secondary | ICD-10-CM | POA: Diagnosis not present

## 2019-09-24 DIAGNOSIS — N2581 Secondary hyperparathyroidism of renal origin: Secondary | ICD-10-CM | POA: Diagnosis not present

## 2019-09-25 ENCOUNTER — Encounter (HOSPITAL_COMMUNITY): Payer: Medicaid Other

## 2019-09-25 ENCOUNTER — Other Ambulatory Visit (HOSPITAL_COMMUNITY): Payer: Medicaid Other

## 2019-09-26 DIAGNOSIS — N186 End stage renal disease: Secondary | ICD-10-CM | POA: Diagnosis not present

## 2019-09-26 DIAGNOSIS — Z992 Dependence on renal dialysis: Secondary | ICD-10-CM | POA: Diagnosis not present

## 2019-09-26 DIAGNOSIS — N2581 Secondary hyperparathyroidism of renal origin: Secondary | ICD-10-CM | POA: Diagnosis not present

## 2019-09-29 DIAGNOSIS — Z992 Dependence on renal dialysis: Secondary | ICD-10-CM | POA: Diagnosis not present

## 2019-09-29 DIAGNOSIS — N2581 Secondary hyperparathyroidism of renal origin: Secondary | ICD-10-CM | POA: Diagnosis not present

## 2019-09-29 DIAGNOSIS — N186 End stage renal disease: Secondary | ICD-10-CM | POA: Diagnosis not present

## 2019-10-02 ENCOUNTER — Telehealth (HOSPITAL_COMMUNITY): Payer: Self-pay

## 2019-10-02 NOTE — Telephone Encounter (Signed)
Detailed instructions left on the patient's cell phone per DPR. Asked to call back with any questions. S.Samaa Ueda EMTP

## 2019-10-03 DIAGNOSIS — Z992 Dependence on renal dialysis: Secondary | ICD-10-CM | POA: Diagnosis not present

## 2019-10-03 DIAGNOSIS — N186 End stage renal disease: Secondary | ICD-10-CM | POA: Diagnosis not present

## 2019-10-03 DIAGNOSIS — N2581 Secondary hyperparathyroidism of renal origin: Secondary | ICD-10-CM | POA: Diagnosis not present

## 2019-10-05 DIAGNOSIS — N186 End stage renal disease: Secondary | ICD-10-CM | POA: Diagnosis not present

## 2019-10-05 DIAGNOSIS — Z992 Dependence on renal dialysis: Secondary | ICD-10-CM | POA: Diagnosis not present

## 2019-10-05 DIAGNOSIS — I129 Hypertensive chronic kidney disease with stage 1 through stage 4 chronic kidney disease, or unspecified chronic kidney disease: Secondary | ICD-10-CM | POA: Diagnosis not present

## 2019-10-07 ENCOUNTER — Encounter (HOSPITAL_COMMUNITY): Payer: Medicaid Other

## 2019-10-07 ENCOUNTER — Other Ambulatory Visit (HOSPITAL_COMMUNITY): Payer: Medicaid Other

## 2019-10-08 DIAGNOSIS — N186 End stage renal disease: Secondary | ICD-10-CM | POA: Diagnosis not present

## 2019-10-08 DIAGNOSIS — N2581 Secondary hyperparathyroidism of renal origin: Secondary | ICD-10-CM | POA: Diagnosis not present

## 2019-10-20 DIAGNOSIS — L02416 Cutaneous abscess of left lower limb: Secondary | ICD-10-CM | POA: Diagnosis not present

## 2019-10-21 ENCOUNTER — Ambulatory Visit (HOSPITAL_COMMUNITY)
Admission: RE | Admit: 2019-10-21 | Discharge: 2019-10-21 | Disposition: A | Discharge status: Home / Self Care | Payer: Medicaid Other | Source: Ambulatory Visit | Attending: Vascular Surgery | Admitting: Vascular Surgery

## 2019-10-21 ENCOUNTER — Other Ambulatory Visit: Payer: Self-pay

## 2019-10-21 ENCOUNTER — Ambulatory Visit (INDEPENDENT_AMBULATORY_CARE_PROVIDER_SITE_OTHER): Payer: Medicaid Other | Admitting: Vascular Surgery

## 2019-10-21 ENCOUNTER — Encounter: Payer: Self-pay | Admitting: Vascular Surgery

## 2019-10-21 ENCOUNTER — Ambulatory Visit (INDEPENDENT_AMBULATORY_CARE_PROVIDER_SITE_OTHER)
Admission: RE | Admit: 2019-10-21 | Discharge: 2019-10-21 | Disposition: A | Discharge status: Home / Self Care | Payer: Medicaid Other | Source: Ambulatory Visit | Attending: Vascular Surgery | Admitting: Vascular Surgery

## 2019-10-21 VITALS — BP 163/106 | HR 90 | Temp 98.0°F | Resp 20 | Ht 72.0 in | Wt 196.0 lb

## 2019-10-21 DIAGNOSIS — N186 End stage renal disease: Secondary | ICD-10-CM | POA: Diagnosis not present

## 2019-10-21 DIAGNOSIS — Z992 Dependence on renal dialysis: Secondary | ICD-10-CM | POA: Insufficient documentation

## 2019-10-21 NOTE — Progress Notes (Signed)
Vascular and Vein Specialist of [place]  Patient name: Patrick Anthony MRN: [id number] DOB: 1971/04/25 Sex: male  REASON FOR CONSULT: Evaluation erosion of left upper arm AV graft  HPI: Patrick Anthony is a 49 y.o. male, who is here today for evaluation of his AV access.  He had a left upper arm AV graft placed 6 years ago.  He has had excellent long-term use of this.  Presents today with aneurysmal change and erosion of the upper portion and the access site.  He denies any bleeding.  He does note purulent drainage from the upper portion at times after dialysis.  Past Medical History:  Diagnosis Date  . Chronic kidney disease    patient on dialysis tues-thurs-sat  . Cirrhosis of liver ([place])   . Drug addiction ([place])   . ETOH abuse   . Hepatitis   . Hypertension   . Renal disorder renal failure  . Sleep apnea   . Systolic and diastolic CHF, 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[place]) 04/19/2013    Family History  Problem Relation Age of Onset  . Hypertension Mother   . Cancer Mother        breast    SOCIAL HISTORY: Social History   Socioeconomic History  . Marital status: Single    Spouse name: Not on file  . Number of children: Not on file  . Years of education: Not on file  . Highest education level: Not on file  Occupational History  . Not on file  Tobacco Use  . Smoking status: Light Tobacco Smoker    Packs/day: 0.20    Types: Cigarettes  . Smokeless tobacco: Never Used  Substance and Sexual Activity  . Alcohol use: No  . Drug use: No    Comment: Quit  . Sexual activity: Not on file  Other Topics Concern  . Not on file  Social History Narrative   ** Merged History Encounter **       Social Determinants of Health   Financial Resource Strain:   . Difficulty of Paying Living Expenses:   Food Insecurity:   . Worried About Charity fundraiser in the Last Year:   . Arboriculturist in the Last Year:   Transportation Needs:   . Lexicographer (Medical):   Marland Kitchen Lack of Transportation (Non-Medical):   Physical Activity:   . Days of Exercise per Week:   . Minutes of Exercise per Session:   Stress:   . Feeling of Stress :   Social Connections:   . Frequency of Communication with Friends and Family:   . Frequency of Social Gatherings with Friends and Family:   . Attends Religious Services:   . Active Member of Clubs or Organizations:   . Attends Archivist Meetings:   Marland Kitchen Marital Status:   Intimate Partner Violence:   . Fear of Current or Ex-Partner:   . Emotionally Abused:   Marland Kitchen Physically Abused:   . Sexually Abused:     No Known Allergies  Current Outpatient Medications  Medication Sig Dispense Refill  . amLODipine (NORVASC) 5 MG tablet Take 1 tablet (5 mg total) by mouth daily. 90 tablet 3  . calcium acetate (PHOSLO) 667 MG capsule Take 3 capsules (2,001 mg total) by mouth 3 (three) times daily with meals. 270 capsule 5  . cloNIDine (CATAPRES) 0.2 MG tablet Take 0.2 mg by mouth daily.    . hydrALAZINE (APRESOLINE) 50 MG tablet TAKE 1 AND 1/2 TABLETS BY  MOUTH THREE TIMES DAILY    . metoprolol tartrate (LOPRESSOR) 25 MG tablet Take 25 mg by mouth daily.   3  . nitroGLYCERIN (NITROSTAT) 0.4 MG SL tablet Place 0.4 mg under the tongue every 5 (five) minutes as needed for chest pain.    . SENSIPAR 30 MG tablet Take 30 mg by mouth daily.    Marland Kitchen VIAGRA 50 MG tablet Take 50 mg by mouth daily as needed.     No current facility-administered medications for this visit.    REVIEW OF SYSTEMS:  [X]  denotes positive finding, [ ]  denotes negative finding Cardiac  Comments:  Chest pain or chest pressure:    Shortness of breath upon exertion:    Short of breath when lying flat:    Irregular heart rhythm:        Vascular    Pain in calf, thigh, or hip brought on by ambulation:    Pain in feet at night that wakes you up from your sleep:     Blood clot in your veins:    Leg swelling:         Pulmonary      Oxygen at home:    Productive cough:     Wheezing:         Neurologic    Sudden weakness in arms or legs:     Sudden numbness in arms or legs:     Sudden onset of difficulty speaking or slurred speech:    Temporary loss of vision in one eye:     Problems with dizziness:         Gastrointestinal    Blood in stool:     Vomited blood:         Genitourinary    Burning when urinating:     Blood in urine:        Psychiatric    Major depression:         Hematologic    Bleeding problems:    Problems with blood clotting too easily:        Skin    Rashes or ulcers:        Constitutional    Fever or chills:      PHYSICAL EXAM: Vitals:   10/21/19 0846  BP: (!) 163/106  Pulse: 90  Resp: 20  Temp: 98 F (36.7 C)  SpO2: 99%  Weight: 88.9 kg  Height: 6' (1.829 m)    GENERAL: The patient is a well-nourished male, in no acute distress. The vital signs are documented above. CARDIOVASCULAR: 2+ radial pulses bilaterally.  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  The skin is thinned with hypopigmentation over the access zones in the upper 1 is ulcerative with some drainage PULMONARY: There is good air exchange  ABDOMEN: Soft and non-tender  MUSCULOSKELETAL: There are no major deformities or cyanosis. NEUROLOGIC: No focal weakness or paresthesias are detected. SKIN: There are no ulcers or rashes noted. PSYCHIATRIC: The patient has a normal affect.  DATA:  Noninvasive studies showed good cephalic veins in his right arm for fistula attempt if needed  MEDICAL ISSUES: I discussed options with the patient.  He has had extremely successful long-term use of his left upper arm AV graft.  He has diffuse degeneration throughout the graft so I do not feel that there is any way for salvage of this.  I have recommended replacement of his left upper arm graft with a new left upper arm graft and hemodialysis catheter placed  at the same setting.  We could tunnel the new Gore-Tex  graft lateral to his current and let this heal for 1 month and then begin using this for dialysis.  I did explain that he has had much better success than typical with his prosthetic graft and hopefully this will continue with placement of a new graft.  We will schedule this surgery on a nondialysis day.  I did discuss my concern regarding rupture and bleeding of his upper arm graft and he knows to report immediately should this occur   Rosetta Posner, MD [facility] Vascular and Vein Specialists of [facility] Tel [phone] Pager [phone]

## 2019-10-21 NOTE — H&P (View-Only) (Signed)
Vascular and Vein Specialist of [place]  Patient name: Patrick Anthony MRN: [id number] DOB: 1971/08/05 Sex: male  REASON FOR CONSULT: Evaluation erosion of left upper arm AV graft  HPI: Patrick Anthony is a 49 y.o. male, who is here today for evaluation of his AV access.  He had a left upper arm AV graft placed 6 years ago.  He has had excellent long-term use of this.  Presents today with aneurysmal change and erosion of the upper portion and the access site.  He denies any bleeding.  He does note purulent drainage from the upper portion at times after dialysis.  Past Medical History:  Diagnosis Date  . Chronic kidney disease    patient on dialysis tues-thurs-sat  . Cirrhosis of liver ([place])   . Drug addiction ([place])   . ETOH abuse   . Hepatitis   . Hypertension   . Renal disorder renal failure  . Sleep apnea   . Systolic and diastolic CHF, 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[place]) 04/19/2013    Family History  Problem Relation Age of Onset  . Hypertension Mother   . Cancer Mother        breast    SOCIAL HISTORY: Social History   Socioeconomic History  . Marital status: Single    Spouse name: Not on file  . Number of children: Not on file  . Years of education: Not on file  . Highest education level: Not on file  Occupational History  . Not on file  Tobacco Use  . Smoking status: Light Tobacco Smoker    Packs/day: 0.20    Types: Cigarettes  . Smokeless tobacco: Never Used  Substance and Sexual Activity  . Alcohol use: No  . Drug use: No    Comment: Quit  . Sexual activity: Not on file  Other Topics Concern  . Not on file  Social History Narrative   ** Merged History Encounter **       Social Determinants of Health   Financial Resource Strain:   . Difficulty of Paying Living Expenses:   Food Insecurity:   . Worried About Charity fundraiser in the Last Year:   . Arboriculturist in the Last Year:   Transportation Needs:   . Lexicographer (Medical):   Marland Kitchen Lack of Transportation (Non-Medical):   Physical Activity:   . Days of Exercise per Week:   . Minutes of Exercise per Session:   Stress:   . Feeling of Stress :   Social Connections:   . Frequency of Communication with Friends and Family:   . Frequency of Social Gatherings with Friends and Family:   . Attends Religious Services:   . Active Member of Clubs or Organizations:   . Attends Archivist Meetings:   Marland Kitchen Marital Status:   Intimate Partner Violence:   . Fear of Current or Ex-Partner:   . Emotionally Abused:   Marland Kitchen Physically Abused:   . Sexually Abused:     No Known Allergies  Current Outpatient Medications  Medication Sig Dispense Refill  . amLODipine (NORVASC) 5 MG tablet Take 1 tablet (5 mg total) by mouth daily. 90 tablet 3  . calcium acetate (PHOSLO) 667 MG capsule Take 3 capsules (2,001 mg total) by mouth 3 (three) times daily with meals. 270 capsule 5  . cloNIDine (CATAPRES) 0.2 MG tablet Take 0.2 mg by mouth daily.    . hydrALAZINE (APRESOLINE) 50 MG tablet TAKE 1 AND 1/2 TABLETS BY  MOUTH THREE TIMES DAILY    . metoprolol tartrate (LOPRESSOR) 25 MG tablet Take 25 mg by mouth daily.   3  . nitroGLYCERIN (NITROSTAT) 0.4 MG SL tablet Place 0.4 mg under the tongue every 5 (five) minutes as needed for chest pain.    . SENSIPAR 30 MG tablet Take 30 mg by mouth daily.    Marland Kitchen VIAGRA 50 MG tablet Take 50 mg by mouth daily as needed.     No current facility-administered medications for this visit.    REVIEW OF SYSTEMS:  [X]  denotes positive finding, [ ]  denotes negative finding Cardiac  Comments:  Chest pain or chest pressure:    Shortness of breath upon exertion:    Short of breath when lying flat:    Irregular heart rhythm:        Vascular    Pain in calf, thigh, or hip brought on by ambulation:    Pain in feet at night that wakes you up from your sleep:     Blood clot in your veins:    Leg swelling:         Pulmonary     Oxygen at home:    Productive cough:     Wheezing:         Neurologic    Sudden weakness in arms or legs:     Sudden numbness in arms or legs:     Sudden onset of difficulty speaking or slurred speech:    Temporary loss of vision in one eye:     Problems with dizziness:         Gastrointestinal    Blood in stool:     Vomited blood:         Genitourinary    Burning when urinating:     Blood in urine:        Psychiatric    Major depression:         Hematologic    Bleeding problems:    Problems with blood clotting too easily:        Skin    Rashes or ulcers:        Constitutional    Fever or chills:      PHYSICAL EXAM: Vitals:   10/21/19 0846  BP: (!) 163/106  Pulse: 90  Resp: 20  Temp: 98 F (36.7 C)  SpO2: 99%  Weight: 88.9 kg  Height: 6' (1.829 m)    GENERAL: The patient is a well-nourished male, in no acute distress. The vital signs are documented above. CARDIOVASCULAR: 2+ radial pulses bilaterally.  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  The skin is thinned with hypopigmentation over the access zones in the upper 1 is ulcerative with some drainage PULMONARY: There is good air exchange  ABDOMEN: Soft and non-tender  MUSCULOSKELETAL: There are no major deformities or cyanosis. NEUROLOGIC: No focal weakness or paresthesias are detected. SKIN: There are no ulcers or rashes noted. PSYCHIATRIC: The patient has a normal affect.  DATA:  Noninvasive studies showed good cephalic veins in his right arm for fistula attempt if needed  MEDICAL ISSUES: I discussed options with the patient.  He has had extremely successful long-term use of his left upper arm AV graft.  He has diffuse degeneration throughout the graft so I do not feel that there is any way for salvage of this.  I have recommended replacement of his left upper arm graft with a new left upper arm graft and hemodialysis catheter placed at  the same setting.  We could tunnel the new Gore-Tex  graft lateral to his current and let this heal for 1 month and then begin using this for dialysis.  I did explain that he has had much better success than typical with his prosthetic graft and hopefully this will continue with placement of a new graft.  We will schedule this surgery on a nondialysis day.  I did discuss my concern regarding rupture and bleeding of his upper arm graft and he knows to report immediately should this occur   Rosetta Posner, MD [facility] Vascular and Vein Specialists of [facility] Tel [phone] Pager [phone]

## 2019-10-22 ENCOUNTER — Telehealth (HOSPITAL_COMMUNITY): Payer: Self-pay

## 2019-10-22 NOTE — Telephone Encounter (Signed)
Attempted to contact the patient, detailed instructions left on the patient's answering machine. Asked to call back with any questions. S.Naif Alabi EMTP

## 2019-10-23 ENCOUNTER — Ambulatory Visit (HOSPITAL_COMMUNITY): Payer: Medicaid Other | Attending: Internal Medicine

## 2019-10-23 ENCOUNTER — Ambulatory Visit (HOSPITAL_BASED_OUTPATIENT_CLINIC_OR_DEPARTMENT_OTHER): Payer: Medicaid Other

## 2019-10-23 ENCOUNTER — Other Ambulatory Visit: Payer: Self-pay

## 2019-10-23 DIAGNOSIS — Q2112 Patent foramen ovale: Secondary | ICD-10-CM

## 2019-10-23 DIAGNOSIS — I5032 Chronic diastolic (congestive) heart failure: Secondary | ICD-10-CM | POA: Diagnosis not present

## 2019-10-23 DIAGNOSIS — R079 Chest pain, unspecified: Secondary | ICD-10-CM

## 2019-10-23 DIAGNOSIS — Q211 Atrial septal defect: Secondary | ICD-10-CM

## 2019-10-23 HISTORY — DX: Atrial septal defect: Q21.1

## 2019-10-23 HISTORY — DX: Patent foramen ovale: Q21.12

## 2019-10-23 LAB — MYOCARDIAL PERFUSION IMAGING
LV dias vol: 106 mL (ref 62–150)
LV sys vol: 59 mL
Peak HR: 98 {beats}/min
Rest HR: 86 {beats}/min
SDS: 0
SRS: 0
SSS: 0
TID: 1.06

## 2019-10-23 LAB — ECHOCARDIOGRAM COMPLETE
Height: 72 in
Weight: 3008 oz

## 2019-10-23 MED ORDER — TECHNETIUM TC 99M TETROFOSMIN IV KIT
30.8000 | PACK | Freq: Once | INTRAVENOUS | Status: DC | PRN
  Filled 2019-10-23: qty 31

## 2019-10-23 MED ORDER — TECHNETIUM TC 99M TETROFOSMIN IV KIT
10.6000 | PACK | Freq: Once | INTRAVENOUS | Status: DC | PRN
  Filled 2019-10-23: qty 11

## 2019-10-23 MED ORDER — TECHNETIUM TC 99M TETROFOSMIN IV KIT
30.8000 | PACK | Freq: Once | INTRAVENOUS | Status: AC | PRN
  Administered 2019-10-23: 30.8 via INTRAVENOUS
  Filled 2019-10-23: qty 31

## 2019-10-23 MED ORDER — TECHNETIUM TC 99M TETROFOSMIN IV KIT
10.6000 | PACK | Freq: Once | INTRAVENOUS | Status: AC | PRN
  Administered 2019-10-23: 10.6 via INTRAVENOUS
  Filled 2019-10-23: qty 11

## 2019-10-23 MED ORDER — REGADENOSON 0.4 MG/5ML IV SOLN: 0.4000 mg | Freq: Once | INTRAVENOUS | Status: DC

## 2019-10-23 MED ORDER — REGADENOSON 0.4 MG/5ML IV SOLN
0.4000 mg | Freq: Once | INTRAVENOUS | Status: AC
  Administered 2019-10-23: 0.4 mg via INTRAVENOUS

## 2019-10-27 NOTE — Progress Notes (Signed)
Virtual Visit via telephone Note   This visit type was conducted due to national recommendations for restrictions regarding the COVID-19 Pandemic (e.g. social distancing) in an effort to limit this patient's exposure and mitigate transmission in our community.  Due to his co-morbid illnesses, this patient is at least at moderate risk for complications without adequate follow up.  This format is felt to be most appropriate for this patient at this time.  All issues noted in this document were discussed and addressed.  A limited physical exam was performed with this format.  Please refer to the patient's chart for his consent to telehealth for [facility].   Evaluation Performed:  Follow-up visit  This visit type was conducted due to national recommendations for restrictions regarding the COVID-19 Pandemic (e.g. social distancing).  This format is felt to be most appropriate for this patient at this time.  All issues noted in this document were discussed and addressed.  No physical exam was performed (except for noted visual exam findings with Video Visits).  Please refer to the patient's chart (MyChart message for video visits and phone note for telephone visits) for the patient's consent to telehealth for [facility].  Date:  10/28/2019   ID:  Patrick Anthony, DOB 03/05/1971, MRN [id number]  Patient Location:  Home  Provider location:   [place]  PCP:  [name], MD  Cardiologist:  Fransico Him, MD  Electrophysiologist:  None   Chief Complaint:  Chest pain  History of Present Illness:    Patrick Anthony is a 49 y.o. male who presents via audio/video conferencing for a telehealth visit today.    Patrick Anthony is a 49 y.o. male with history of ESRD on HD, liver cirrhosis with hepatitis, polysubstance abuse with drug addiction and EtOH addiction, hypertension, chronic combined systolic and diastolic CHF.  Echo in 2014 LVEF 40 to 45% with moderate LVH, grade 2 DD, mild AR/MR and  mild to moderate TR.  Patient saw me in  04/2017 because of chest pain while on dialysis.  Blood pressure would shoot up but better controlled on amlodipine.  NST 05/24/2017 low risk study with no ischemia.  EF is 46% but 2D echo same day showed normal LVEF 55 to 60% with grade 1 DD mild mitral regurgitation and moderately dilated left atrium.  He was seen back by Estella Husk, PA for recurrent CP with HD.  About an hour into HD he would get chest tightness and HD would be stopped and he would get a fluid bolus and pain would improve.  On non HD days he would have no pain with activity.  BP runs high at HD over [id number] systolic.  Amlodipine 5mg  daily was added.  Nuclear stress test was reviewed and showed no ischemia.  2D echo showed normal LVF with severe LVH likely related to poorly controlled HTN, increased LV strain, mild AR and mildly dilated ascending aorta at 70mm.    He is now back for followup. He never got his amlodipine filled so he is stlil having chest pressure when he has HD that he describes as a pressure in the center of his chest that resolves when they stop HD.  This only occurs with HD and not with any exertional activity.   He denies any SOB except when he is on HD.  He denies any DOE, PND, orthopnea, LE edema, dizziness, palpitations or syncope. He is compliant with his meds except for getting his amlodipine filled and is tolerating meds with no  SE.    The patient does not have symptoms concerning for COVID-19 infection (fever, chills, cough, or new shortness of breath).    Prior CV studies:   The following studies were reviewed today:  Office notes from last month, nuclear stress test, 2D echo  Past Medical History:  Diagnosis Date  . Chronic kidney disease    patient on dialysis tues-thurs-sat  . Cirrhosis of liver ([place])   . Drug addiction ([place])   . ETOH abuse   . Hepatitis   . Hypertension   . Renal disorder renal failure  . Sleep apnea   . Systolic and diastolic  CHF, 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[place]) 04/19/2013   Past Surgical History:  Procedure Laterality Date  . AV FISTULA PLACEMENT Right   . AV FISTULA PLACEMENT Left 09/03/2013   Procedure: ARTERIOVENOUS (AV) FISTULA CREATION - LEFT BRACHIOCEPHALIC;  Surgeon: Conrad Bushton, MD;  Location: [place];  Service: Vascular;  Laterality: Left;  . INSERTION OF DIALYSIS CATHETER N/A 08/29/2013   Procedure: INSERTION OF DIALYSIS CATHETER;  Surgeon: Mal Misty, MD;  Location: [place];  Service: Vascular;  Laterality: N/A;  . PERIPHERAL VASCULAR CATHETERIZATION N/A 01/06/2015   Procedure: A/V Shuntogram/Fistulagram;  Surgeon: Katha Cabal, MD;  Location: [place] CV [facility];  Service: Cardiovascular;  Laterality: N/A;  . PERIPHERAL VASCULAR CATHETERIZATION Left 01/06/2015   Procedure: A/V Shunt Intervention;  Surgeon: Katha Cabal, MD;  Location: [place] CV [facility];  Service: Cardiovascular;  Laterality: Left;     Current Meds  Medication Sig  . calcium acetate (PHOSLO) 667 MG capsule Take 3 capsules (2,001 mg total) by mouth 3 (three) times daily with meals. (Patient taking differently: Take 667-2,001 mg by mouth See admin instructions. Take 3 capsules (2001 mg) by mouth with each meal & take 1 capsule (667 mg) by mouth with each snack)  . cloNIDine (CATAPRES) 0.3 MG tablet Take 0.3 mg by mouth in the morning, at noon, and at bedtime.  . gabapentin (NEURONTIN) 100 MG capsule Take 200 mg by mouth at bedtime.  . hydrALAZINE (APRESOLINE) 50 MG tablet Take 75 mg by mouth in the morning, at noon, and at bedtime.   . metoprolol tartrate (LOPRESSOR) 25 MG tablet Take 75 mg by mouth 2 (two) times daily.   . nitroGLYCERIN (NITROSTAT) 0.4 MG SL tablet Place 0.4 mg under the tongue every 5 (five) minutes x 3 doses as needed for chest pain.   . SENSIPAR 30 MG tablet Take 30 mg by mouth daily.  Marland Kitchen VIAGRA 50 MG tablet Take 50 mg by mouth daily as needed for erectile dysfunction.      Allergies:   Patient has no known  allergies.   Social History   Tobacco Use  . Smoking status: Light Tobacco Smoker    Packs/day: 0.20    Types: Cigarettes  . Smokeless tobacco: Never Used  Substance Use Topics  . Alcohol use: No  . Drug use: No    Comment: Quit     Family Hx: The patient's family history includes Cancer in his mother; Hypertension in his mother.  ROS:   Please see the history of present illness.     All other systems reviewed and are negative.   Labs/Other Tests and Data Reviewed:    Recent Labs: No results found for requested labs within last 8760 hours.   Recent Lipid Panel Lab Results  Component Value Date/Time   CHOL 165 08/28/2013 09:07 PM   TRIG 126 08/28/2013 09:07 PM  HDL 32 (L) 08/28/2013 09:07 PM   CHOLHDL 5.2 08/28/2013 09:07 PM   LDLCALC 108 (H) 08/28/2013 09:07 PM    Wt Readings from Last 3 Encounters:  10/28/19 186 lb (84.4 kg)  10/23/19 188 lb (85.3 kg)  10/21/19 196 lb (88.9 kg)     Objective:    Vital Signs:  Ht 6' (1.829 m)   Wt 186 lb (84.4 kg)   BMI 25.23 kg/m     ASSESSMENT & PLAN:    1.  Chest pain -CP at HD >> still occurring but unfortunately he never got his amlodipine filled and Bp at HD is still running high -likely related to poorly controlled HTN as evidenced by increased wall thickness on echo compared to last study -nuclear stress test with no ischemia  2.  HTN -2D echo with severe LVH which has progressed on recent echo c/w end organ damage from poorly controlled HTN -he never started the amlodipine that was prescribed a month ago - he says that he never got a call saying it was ready -we will call his pharmacy -his BP at HD is running 170/137mmHg -continue Clonidine 0.3mg  TID, Hydralazine 75mg  TID, Lopressor 75mg  BID  3.  ESRD -followed by nephrology -on HD  4.  Chronic diastolic CHF -volume controlled with HD -needs good control of BP -given marked LVH on echo will get PYP scan to rule out TTR amyloid  COVID-19  Education: The signs and symptoms of COVID-19 were discussed with the patient and how to seek care for testing (follow up with PCP or arrange E-visit).  The importance of social distancing was discussed today.  Patient Risk:   After full review of this patient's clinical status, I feel that they are at least moderate risk at this time.  Time:   Today, I have spent 20 minutes on telemedicine discussing medical problems including HTN, CHF, CP, ESRD and reviewing patient's chart including Nuclear stress test, 2D echo and OV notes from PA.  Medication Adjustments/Labs and Tests Ordered: Current medicines are reviewed at length with the patient today.  Concerns regarding medicines are outlined above.  Tests Ordered: No orders of the defined types were placed in this encounter.  Medication Changes: No orders of the defined types were placed in this encounter.   Disposition:  Follow up in 4 week(s) with Estella Husk  Signed, Fransico Him, MD  10/28/2019 8:59 AM    [place]

## 2019-10-28 ENCOUNTER — Other Ambulatory Visit: Payer: Self-pay

## 2019-10-28 ENCOUNTER — Other Ambulatory Visit (HOSPITAL_COMMUNITY): Payer: Medicaid Other

## 2019-10-28 ENCOUNTER — Telehealth: Payer: Self-pay

## 2019-10-28 ENCOUNTER — Telehealth (INDEPENDENT_AMBULATORY_CARE_PROVIDER_SITE_OTHER): Payer: Medicaid Other | Admitting: Cardiology

## 2019-10-28 ENCOUNTER — Encounter: Payer: Self-pay | Admitting: Cardiology

## 2019-10-28 VITALS — Ht 72.0 in | Wt 186.0 lb

## 2019-10-28 DIAGNOSIS — I10 Essential (primary) hypertension: Secondary | ICD-10-CM | POA: Diagnosis not present

## 2019-10-28 DIAGNOSIS — R079 Chest pain, unspecified: Secondary | ICD-10-CM | POA: Diagnosis not present

## 2019-10-28 DIAGNOSIS — I5032 Chronic diastolic (congestive) heart failure: Secondary | ICD-10-CM | POA: Diagnosis not present

## 2019-10-28 DIAGNOSIS — Z992 Dependence on renal dialysis: Secondary | ICD-10-CM | POA: Diagnosis not present

## 2019-10-28 DIAGNOSIS — N186 End stage renal disease: Secondary | ICD-10-CM | POA: Diagnosis not present

## 2019-10-28 MED ORDER — AMLODIPINE BESYLATE 5 MG PO TABS: 5.0000 mg | ORAL_TABLET | Freq: Every day | ORAL | 3 refills | Status: DC

## 2019-10-28 NOTE — Patient Instructions (Signed)
Medication Instructions:  Your physician recommends that you continue on your current medications as directed. Please refer to the Current Medication list given to you today.  *If you need a refill on your cardiac medications before your next appointment, please call your pharmacy*   Testing/Procedures: Your provider has requested that you have a cardiac PYP Scan.    Follow-Up: At [facility], you and your health needs are our priority.  As part of our continuing mission to provide you with exceptional heart care, we have created designated Provider Care Teams.  These Care Teams include your primary Cardiologist (physician) and Advanced Practice Providers (APPs -  Physician Assistants and Nurse Practitioners) who all work together to provide you with the care you need, when you need it.  We recommend signing up for the patient portal called "MyChart".  Sign up information is provided on this After Visit Summary.  MyChart is used to connect with patients for Virtual Visits (Telemedicine).  Patients are able to view lab/test results, encounter notes, upcoming appointments, etc.  Non-urgent messages can be sent to your provider as well.   To learn more about what you can do with MyChart, go to [email or web address].    Your next appointment:   4 week(s)  The format for your next appointment:   In Person  Provider:   Ermalinda Barrios, PA-C

## 2019-10-28 NOTE — Telephone Encounter (Signed)
Called patient to follow-up after virtual visit with Dr. Radford Pax.  Left message for patient to schedule 4 week FU with Estella Husk, PA-C.  Patient also needs PYP scan scheduled.

## 2019-10-28 NOTE — Telephone Encounter (Signed)
  Patient Consent for Virtual Visit         Patrick Anthony has provided verbal consent on 10/28/2019 for a virtual visit (video or telephone).   CONSENT FOR VIRTUAL VISIT FOR:  Patrick Anthony  By participating in this virtual visit I agree to the following:  I hereby voluntarily request, consent and authorize [facility] and its employed or contracted physicians, physician assistants, nurse practitioners or other licensed health care professionals (the Practitioner), to provide me with telemedicine health care services (the "Services") as deemed necessary by the treating Practitioner. I acknowledge and consent to receive the Services by the Practitioner via telemedicine. I understand that the telemedicine visit will involve communicating with the Practitioner through live audiovisual communication technology and the disclosure of certain medical information by electronic transmission. I acknowledge that I have been given the opportunity to request an in-person assessment or other available alternative prior to the telemedicine visit and am voluntarily participating in the telemedicine visit.  I understand that I have the right to withhold or withdraw my consent to the use of telemedicine in the course of my care at any time, without affecting my right to future care or treatment, and that the Practitioner or I may terminate the telemedicine visit at any time. I understand that I have the right to inspect all information obtained and/or recorded in the course of the telemedicine visit and may receive copies of available information for a reasonable fee.  I understand that some of the potential risks of receiving the Services via telemedicine include:  Marland Kitchen Delay or interruption in medical evaluation due to technological equipment failure or disruption; . Information transmitted may not be sufficient (e.g. poor resolution of images) to allow for appropriate medical decision making by the Practitioner;  and/or  . In rare instances, security protocols could fail, causing a breach of personal health information.  Furthermore, I acknowledge that it is my responsibility to provide information about my medical history, conditions and care that is complete and accurate to the best of my ability. I acknowledge that Practitioner's advice, recommendations, and/or decision may be based on factors not within their control, such as incomplete or inaccurate data provided by me or distortions of diagnostic images or specimens that may result from electronic transmissions. I understand that the practice of medicine is not an exact science and that Practitioner makes no warranties or guarantees regarding treatment outcomes. I acknowledge that a copy of this consent can be made available to me via my patient portal ([facility]), or I can request a printed copy by calling the office of [facility].    I understand that my insurance will be billed for this visit.   I have read or had this consent read to me. . I understand the contents of this consent, which adequately explains the benefits and risks of the Services being provided via telemedicine.  . I have been provided ample opportunity to ask questions regarding this consent and the Services and have had my questions answered to my satisfaction. . I give my informed consent for the services to be provided through the use of telemedicine in my medical care

## 2019-10-29 ENCOUNTER — Other Ambulatory Visit: Payer: Self-pay

## 2019-10-29 ENCOUNTER — Encounter (HOSPITAL_COMMUNITY): Payer: Self-pay | Admitting: Vascular Surgery

## 2019-10-29 ENCOUNTER — Other Ambulatory Visit (HOSPITAL_COMMUNITY)
Admission: RE | Admit: 2019-10-29 | Discharge: 2019-10-29 | Disposition: A | Discharge status: Home / Self Care | Payer: Medicaid Other | Source: Ambulatory Visit | Attending: Vascular Surgery | Admitting: Vascular Surgery

## 2019-10-29 DIAGNOSIS — Z01812 Encounter for preprocedural laboratory examination: Secondary | ICD-10-CM | POA: Diagnosis not present

## 2019-10-29 DIAGNOSIS — Z20822 Contact with and (suspected) exposure to covid-19: Secondary | ICD-10-CM | POA: Insufficient documentation

## 2019-10-29 DIAGNOSIS — Z8614 Personal history of Methicillin resistant Staphylococcus aureus infection: Secondary | ICD-10-CM | POA: Diagnosis not present

## 2019-10-29 LAB — SARS CORONAVIRUS 2 (TAT 6-24 HRS): SARS Coronavirus 2: NEGATIVE

## 2019-10-29 NOTE — Progress Notes (Signed)
Anesthesia Chart Review: Same day workup  Follows with cardiology for hx of hypertension, chronic combined systolic and diastolic CHF. Evaluated in 2018 for chest pain during dialysis. NST 05/24/2017 low risk study with no ischemia. EF was 46% but 2D echo same day showed normal LVEF 55 to 60% with grade 1 DD mild mitral regurgitation and moderately dilated left atrium. He was recently seen back 09/09/19 for recurrent CP with HD.  Reportedly about an hour into HD he would get chest tightness and HD would be stopped and he would get a fluid bolus and pain would improve.  On non HD days he would have no pain with activity.  BP was running high at HD over [id number] systolic.  Amlodipine 5mg  daily was added.  Nuclear stress test 10/23/19 showed no ischemia.  2D echo 10/23/19 showed normal LVF with severe LVH likely related to poorly controlled HTN, increased LV strain, mild AR and mildly dilated ascending aorta at 20mm.    He was last seen by Dr. Radford Pax 10/28/19 and discussed that he was still having CP at HD. He never filled prescription for amlodipine and was still hypertensive at HD. CP felt related to poorly controlled HTN. He was again recommended to start amlodipine and continue Clonidine 0.3mg  TID, Hydralazine 75mg  TID, Lopressor 75mg  BID. It was also noted that recent echo showed severe LVH which has progressed from prior c/w end organ damage from poorly controlled HTN. Recommended PYP scan to rule out TTR amyloid and f/u in 1 month. Scan has not been scheduled yet.  Other pertinent hx includes liver cirrhosis with hepatitis, polysubstance abuse with drug addiction and EtOH addiction.  ESRD on HD TThS  Will need DOS labs and eval.   EKG 09/09/19:  normal sinus rhythm with first-degree AV block, LVH, lateral T wave abnormality unchanged from prior tracings  Nuclear stress 10/23/19:  The left ventricular ejection fraction is mildly decreased (45-54%).  Nuclear stress EF: 44%.  There was no ST segment  deviation noted during stress.  This is an intermediate risk study.   Mild fixed defect in the basal inferior wall likely represents artifact, though cannot exclude focal infarction. Not significantly changed in appearance compared to the prior exam.  No ischemia on perfusion images.  EF remains reduced, though visually appears 55%. Comparison to echocardiogram performed today recommended.  TTE 10/23/19: 1. Compared with the echo 05/2017, myocardial thickness has increased  from 1.4 cm to 1.8 cm, consistent with severe LVH. Abnormal GLS. Left  ventricular ejection fraction, by estimation, is 55 to 60%. The left  ventricle has normal function. The left  ventricle has no regional wall motion abnormalities. There is severe  concentric left ventricular hypertrophy. Left ventricular diastolic  parameters are consistent with Grade I diastolic dysfunction (impaired  relaxation). The average left ventricular  global longitudinal strain is -14.0 %.  2. Right ventricular systolic function is normal. The right ventricular  size is normal.  3. There is a small patent foramen ovale.  4. The mitral valve is normal in structure. No evidence of mitral valve  regurgitation. No evidence of mitral stenosis.  5. The aortic valve is tricuspid. Aortic valve regurgitation is mild. No  aortic stenosis is present.  6. Aortic dilatation noted. There is mild dilatation of the ascending  aorta measuring 40 mm.  7. The inferior vena cava is normal in size with greater than 50%  respiratory variability, suggesting right atrial pressure of 3 mmHg.    Karoline Caldwell, PA-C [facility]  Stay [facility]/Anesthesiology Phone [phone] 10/29/2019 11:54 AM

## 2019-10-29 NOTE — Progress Notes (Signed)
PCP - [facility] Cardiologist - traci turner    Chest x-ray -  EKG - 09/09/19 Stress Test - 10/23/19 ECHO - 10/23/19 Cardiac Cath -   Sleep Study - 2-3 yrs. Ago- pt. Doesn't remember where CPAP - no   COVID TEST- 10/29/19-(-pt. Forgot to go on  10/28/19)  Pt. Reported he has no one to drive him home or stay with him after surgery. Notified Shannon,Dr. Luther Parody office, she stated they will make him a 23 hrs. Obs. Pt.   Anesthesia review:   Patient denies shortness of breath, fever, cough and chest pain at PAT call.   All instructions explained to the patient, with a verbal understanding . Patient also instructed to self quarantine after being tested for COVID-19. The opportunity to ask questions was provided.

## 2019-10-29 NOTE — Anesthesia Preprocedure Evaluation (Addendum)
Anesthesia Evaluation  Patient identified by MRN, date of birth, ID band Patient awake    Reviewed: Allergy & Precautions, NPO status , Patient's Chart, lab work & pertinent test results  Airway Mallampati: II  TM Distance: >3 FB Neck ROM: Full    Dental no notable dental hx.    Pulmonary neg pulmonary ROS, Current Smoker and Patient abstained from smoking.,    Pulmonary exam normal breath sounds clear to auscultation       Cardiovascular hypertension, Pt. on medications and Pt. on home beta blockers +CHF   Rhythm:Regular Rate:Normal + Systolic murmurs EF normal Small PFO   Neuro/Psych negative neurological ROS  negative psych ROS   GI/Hepatic negative GI ROS, (+)     substance abuse  alcohol use and cocaine use, Hepatitis -  Endo/Other  negative endocrine ROS  Renal/GU DialysisRenal disease  negative genitourinary   Musculoskeletal negative musculoskeletal ROS (+)   Abdominal   Peds negative pediatric ROS (+)  Hematology negative hematology ROS (+)   Anesthesia Other Findings   Reproductive/Obstetrics negative OB ROS                            Anesthesia Physical Anesthesia Plan  ASA: IV  Anesthesia Plan: MAC   Post-op Pain Management:    Induction: Intravenous  PONV Risk Score and Plan:   Airway Management Planned: Simple Face Mask  Additional Equipment:   Intra-op Plan:   Post-operative Plan:   Informed Consent: I have reviewed the patients History and Physical, chart, labs and discussed the procedure including the risks, benefits and alternatives for the proposed anesthesia with the patient or authorized representative who has indicated his/her understanding and acceptance.     Dental advisory given  Plan Discussed with: CRNA and Surgeon  Anesthesia Plan Comments: (Follows with cardiology for hx of hypertension, chronic combined systolic and diastolic CHF.  Evaluated in 2018 for chest pain during dialysis. NST 05/24/2017 low risk study with no ischemia. EF was 46% but 2D echo same day showed normal LVEF 55 to 60% with grade 1 DD mild mitral regurgitation and moderately dilated left atrium. He was recently seen back 09/09/19 for recurrent CP with HD.  Reportedly about an hour into HD he would get chest tightness and HD would be stopped and he would get a fluid bolus and pain would improve.  On non HD days he would have no pain with activity.  BP was running high at HD over [id number] systolic.  Amlodipine 36m daily was added.  Nuclear stress test 10/23/19 showed no ischemia.  2D echo 10/23/19 showed normal LVF with severe LVH likely related to poorly controlled HTN, increased LV strain, mild AR and mildly dilated ascending aorta at 451m    He was last seen by Dr. TuRadford Pax/23/21 and discussed that he was still having CP at HD. He never filled prescription for amlodipine and was still hypertensive at HD. CP felt related to poorly controlled HTN. He was again recommended to start amlodipine and continue Clonidine 0.32m29mID, Hydralazine 24m110mD, Lopressor 24mg68m. It was also noted that recent echo showed severe LVH which has progressed from prior c/w end organ damage from poorly controlled HTN. Recommended PYP scan to rule out TTR amyloid and f/u in 1 month. Scan has not been scheduled yet.  Other pertinent hx includes liver cirrhosis with hepatitis, polysubstance abuse with drug addiction and EtOH addiction.  ESRD on HD TThS  Will need  DOS labs and eval.   EKG 09/09/19:  normal sinus rhythm with first-degree AV block, LVH, lateral T wave abnormality unchanged from prior tracings  Nuclear stress 10/23/19: The left ventricular ejection fraction is mildly decreased (45-54%). Nuclear stress EF: 44%. There was no ST segment deviation noted during stress. This is an intermediate risk study.   Mild fixed defect in the basal inferior wall likely represents  artifact, though cannot exclude focal infarction. Not significantly changed in appearance compared to the prior exam.  No ischemia on perfusion images.  EF remains reduced, though visually appears 55%. Comparison to echocardiogram performed today recommended.  TTE 10/23/19: 1. Compared with the echo 05/2017, myocardial thickness has increased  from 1.4 cm to 1.8 cm, consistent with severe LVH. Abnormal GLS. Left  ventricular ejection fraction, by estimation, is 55 to 60%. The left  ventricle has normal function. The left  ventricle has no regional wall motion abnormalities. There is severe  concentric left ventricular hypertrophy. Left ventricular diastolic  parameters are consistent with Grade I diastolic dysfunction (impaired  relaxation). The average left ventricular  global longitudinal strain is -14.0 %.  2. Right ventricular systolic function is normal. The right ventricular  size is normal.  3. There is a small patent foramen ovale.  4. The mitral valve is normal in structure. No evidence of mitral valve  regurgitation. No evidence of mitral stenosis.  5. The aortic valve is tricuspid. Aortic valve regurgitation is mild. No  aortic stenosis is present.  6. Aortic dilatation noted. There is mild dilatation of the ascending  aorta measuring 40 mm.  7. The inferior vena cava is normal in size with greater than 50%  respiratory variability, suggesting right atrial pressure of 3 mmHg.  )       Anesthesia Quick Evaluation

## 2019-10-30 ENCOUNTER — Observation Stay (HOSPITAL_COMMUNITY)
Admission: RE | Admit: 2019-10-30 | Discharge: 2019-10-31 | Disposition: A | Discharge status: Home / Self Care | Payer: Medicaid Other | Attending: Vascular Surgery | Admitting: Vascular Surgery

## 2019-10-30 ENCOUNTER — Ambulatory Visit (HOSPITAL_COMMUNITY): Payer: Medicaid Other

## 2019-10-30 ENCOUNTER — Ambulatory Visit (HOSPITAL_COMMUNITY): Payer: Medicaid Other | Admitting: Physician Assistant

## 2019-10-30 ENCOUNTER — Encounter (HOSPITAL_COMMUNITY)
Admission: RE | Disposition: A | Discharge status: Home / Self Care | Payer: Self-pay | Source: Home / Self Care | Attending: Vascular Surgery

## 2019-10-30 ENCOUNTER — Other Ambulatory Visit: Payer: Self-pay

## 2019-10-30 ENCOUNTER — Encounter (HOSPITAL_COMMUNITY): Payer: Self-pay | Admitting: Vascular Surgery

## 2019-10-30 DIAGNOSIS — N186 End stage renal disease: Secondary | ICD-10-CM

## 2019-10-30 DIAGNOSIS — T82510A Breakdown (mechanical) of surgically created arteriovenous fistula, initial encounter: Secondary | ICD-10-CM | POA: Diagnosis not present

## 2019-10-30 DIAGNOSIS — G4730 Sleep apnea, unspecified: Secondary | ICD-10-CM | POA: Diagnosis not present

## 2019-10-30 DIAGNOSIS — D631 Anemia in chronic kidney disease: Secondary | ICD-10-CM | POA: Diagnosis not present

## 2019-10-30 DIAGNOSIS — I5042 Chronic combined systolic (congestive) and diastolic (congestive) heart failure: Secondary | ICD-10-CM | POA: Insufficient documentation

## 2019-10-30 DIAGNOSIS — K7460 Unspecified cirrhosis of liver: Secondary | ICD-10-CM | POA: Diagnosis not present

## 2019-10-30 DIAGNOSIS — Y841 Kidney dialysis as the cause of abnormal reaction of the patient, or of later complication, without mention of misadventure at the time of the procedure: Secondary | ICD-10-CM | POA: Diagnosis not present

## 2019-10-30 DIAGNOSIS — Z992 Dependence on renal dialysis: Secondary | ICD-10-CM | POA: Diagnosis not present

## 2019-10-30 DIAGNOSIS — Z79899 Other long term (current) drug therapy: Secondary | ICD-10-CM | POA: Insufficient documentation

## 2019-10-30 DIAGNOSIS — F17210 Nicotine dependence, cigarettes, uncomplicated: Secondary | ICD-10-CM | POA: Diagnosis not present

## 2019-10-30 DIAGNOSIS — Z09 Encounter for follow-up examination after completed treatment for conditions other than malignant neoplasm: Secondary | ICD-10-CM

## 2019-10-30 DIAGNOSIS — I130 Hypertensive heart and chronic kidney disease with heart failure and stage 1 through stage 4 chronic kidney disease, or unspecified chronic kidney disease: Secondary | ICD-10-CM | POA: Diagnosis not present

## 2019-10-30 DIAGNOSIS — N185 Chronic kidney disease, stage 5: Secondary | ICD-10-CM | POA: Diagnosis not present

## 2019-10-30 DIAGNOSIS — I132 Hypertensive heart and chronic kidney disease with heart failure and with stage 5 chronic kidney disease, or end stage renal disease: Secondary | ICD-10-CM | POA: Insufficient documentation

## 2019-10-30 HISTORY — PX: INSERTION OF DIALYSIS CATHETER: SHX1324

## 2019-10-30 HISTORY — PX: AV FISTULA PLACEMENT: SHX1204

## 2019-10-30 LAB — POCT I-STAT, CHEM 8
BUN: 34 mg/dL — ABNORMAL HIGH (ref 6–20)
Calcium, Ion: 0.99 mmol/L — ABNORMAL LOW (ref 1.15–1.40)
Chloride: 95 mmol/L — ABNORMAL LOW (ref 98–111)
Creatinine, Ser: 10.8 mg/dL — ABNORMAL HIGH (ref 0.61–1.24)
Glucose, Bld: 105 mg/dL — ABNORMAL HIGH (ref 70–99)
HCT: 43 % (ref 39.0–52.0)
Hemoglobin: 14.6 g/dL (ref 13.0–17.0)
Potassium: 4.9 mmol/L (ref 3.5–5.1)
Sodium: 135 mmol/L (ref 135–145)
TCO2: 30 mmol/L (ref 22–32)

## 2019-10-30 LAB — CBC
HCT: 40.9 % (ref 39.0–52.0)
Hemoglobin: 12.9 g/dL — ABNORMAL LOW (ref 13.0–17.0)
MCH: 29.5 pg (ref 26.0–34.0)
MCHC: 31.5 g/dL (ref 30.0–36.0)
MCV: 93.4 fL (ref 80.0–100.0)
Platelets: 272 10*3/uL (ref 150–400)
RBC: 4.38 MIL/uL (ref 4.22–5.81)
RDW: 14.5 % (ref 11.5–15.5)
WBC: 8.4 10*3/uL (ref 4.0–10.5)
nRBC: 0 % (ref 0.0–0.2)

## 2019-10-30 LAB — CREATININE, SERUM
Creatinine, Ser: 11.07 mg/dL — ABNORMAL HIGH (ref 0.61–1.24)
GFR calc Af Amer: 6 mL/min — ABNORMAL LOW (ref >60)
GFR calc non Af Amer: 5 mL/min — ABNORMAL LOW (ref >60)

## 2019-10-30 LAB — PROTIME-INR
INR: 1 (ref 0.8–1.2)
Prothrombin Time: 13.5 seconds (ref 11.4–15.2)

## 2019-10-30 SURGERY — INSERTION OF ARTERIOVENOUS (AV) GORE-TEX GRAFT ARM
Anesthesia: General | Site: Neck | Laterality: Left

## 2019-10-30 MED ORDER — ONDANSETRON HCL 4 MG/2ML IJ SOLN
INTRAMUSCULAR | Status: AC
  Filled 2019-10-30: qty 2

## 2019-10-30 MED ORDER — CHLORHEXIDINE GLUCONATE 4 % EX LIQD: 60.0000 mL | Freq: Once | CUTANEOUS | Status: DC

## 2019-10-30 MED ORDER — FENTANYL CITRATE (PF) 250 MCG/5ML IJ SOLN
INTRAMUSCULAR | Status: AC
  Filled 2019-10-30: qty 5

## 2019-10-30 MED ORDER — PROMETHAZINE HCL 25 MG/ML IJ SOLN: 6.2500 mg | INTRAMUSCULAR | Status: DC | PRN

## 2019-10-30 MED ORDER — ONDANSETRON HCL 4 MG/2ML IJ SOLN
INTRAMUSCULAR | Status: DC | PRN
  Administered 2019-10-30: 4 mg via INTRAVENOUS

## 2019-10-30 MED ORDER — ALBUTEROL SULFATE HFA 108 (90 BASE) MCG/ACT IN AERS
INHALATION_SPRAY | RESPIRATORY_TRACT | Status: AC
  Filled 2019-10-30: qty 6.7

## 2019-10-30 MED ORDER — PHENYLEPHRINE HCL-NACL 10-0.9 MG/250ML-% IV SOLN
INTRAVENOUS | Status: DC | PRN
  Administered 2019-10-30: 40 ug/min via INTRAVENOUS

## 2019-10-30 MED ORDER — POTASSIUM CHLORIDE CRYS ER 20 MEQ PO TBCR
20.0000 meq | EXTENDED_RELEASE_TABLET | Freq: Once | ORAL | Status: DC
  Filled 2019-10-30: qty 2

## 2019-10-30 MED ORDER — MIDAZOLAM HCL 2 MG/2ML IJ SOLN
INTRAMUSCULAR | Status: AC
  Filled 2019-10-30: qty 2

## 2019-10-30 MED ORDER — HYDRALAZINE HCL 20 MG/ML IJ SOLN
5.0000 mg | INTRAMUSCULAR | Status: DC | PRN
  Filled 2019-10-30: qty 1

## 2019-10-30 MED ORDER — PHENYLEPHRINE 40 MCG/ML (10ML) SYRINGE FOR IV PUSH (FOR BLOOD PRESSURE SUPPORT)
PREFILLED_SYRINGE | INTRAVENOUS | Status: AC
  Filled 2019-10-30: qty 10

## 2019-10-30 MED ORDER — CINACALCET HCL 30 MG PO TABS: 30.0000 mg | ORAL_TABLET | Freq: Every day | ORAL | Status: DC

## 2019-10-30 MED ORDER — ALBUTEROL SULFATE HFA 108 (90 BASE) MCG/ACT IN AERS
INHALATION_SPRAY | RESPIRATORY_TRACT | Status: DC | PRN
  Administered 2019-10-30: 4 via RESPIRATORY_TRACT

## 2019-10-30 MED ORDER — FENTANYL CITRATE (PF) 100 MCG/2ML IJ SOLN
INTRAMUSCULAR | Status: DC | PRN
  Administered 2019-10-30 (×4): 25 ug via INTRAVENOUS

## 2019-10-30 MED ORDER — DEXAMETHASONE SODIUM PHOSPHATE 10 MG/ML IJ SOLN
INTRAMUSCULAR | Status: AC
  Filled 2019-10-30: qty 1

## 2019-10-30 MED ORDER — SODIUM CHLORIDE 0.9 % IV SOLN: INTRAVENOUS | Status: DC

## 2019-10-30 MED ORDER — LABETALOL HCL 5 MG/ML IV SOLN: 10.0000 mg | INTRAVENOUS | Status: DC | PRN

## 2019-10-30 MED ORDER — HEPARIN SODIUM (PORCINE) 1000 UNIT/ML IJ SOLN
INTRAMUSCULAR | Status: DC | PRN
  Administered 2019-10-30: 1000 [IU]

## 2019-10-30 MED ORDER — METOPROLOL TARTRATE 50 MG PO TABS
75.0000 mg | ORAL_TABLET | Freq: Two times a day (BID) | ORAL | Status: DC
  Administered 2019-10-30: 75 mg via ORAL
  Filled 2019-10-30: qty 1

## 2019-10-30 MED ORDER — HEPARIN SODIUM (PORCINE) 5000 UNIT/ML IJ SOLN
5000.0000 [IU] | Freq: Three times a day (TID) | INTRAMUSCULAR | Status: DC
  Administered 2019-10-31: 5000 [IU] via SUBCUTANEOUS
  Filled 2019-10-30: qty 1

## 2019-10-30 MED ORDER — METOPROLOL TARTRATE 5 MG/5ML IV SOLN: 2.0000 mg | INTRAVENOUS | Status: DC | PRN

## 2019-10-30 MED ORDER — CEFAZOLIN SODIUM-DEXTROSE 2-4 GM/100ML-% IV SOLN
2.0000 g | INTRAVENOUS | Status: AC
  Administered 2019-10-30: 2 g via INTRAVENOUS
  Filled 2019-10-30: qty 100

## 2019-10-30 MED ORDER — OXYCODONE-ACETAMINOPHEN 7.5-325 MG PO TABS: 1.0000 | ORAL_TABLET | ORAL | 0 refills | Status: AC | PRN

## 2019-10-30 MED ORDER — ALUM & MAG HYDROXIDE-SIMETH 200-200-20 MG/5ML PO SUSP: 15.0000 mL | ORAL | Status: DC | PRN

## 2019-10-30 MED ORDER — SODIUM CHLORIDE 0.9 % IV SOLN
250.0000 mL | INTRAVENOUS | Status: DC | PRN
  Administered 2019-10-30: 250 mL via INTRAVENOUS

## 2019-10-30 MED ORDER — CALCIUM ACETATE (PHOS BINDER) 667 MG PO CAPS
2001.0000 mg | ORAL_CAPSULE | Freq: Three times a day (TID) | ORAL | Status: DC
  Administered 2019-10-30 – 2019-10-31 (×2): 2001 mg via ORAL
  Filled 2019-10-30 (×3): qty 3

## 2019-10-30 MED ORDER — AMLODIPINE BESYLATE 5 MG PO TABS
5.0000 mg | ORAL_TABLET | Freq: Every day | ORAL | Status: DC
  Administered 2019-10-30: 5 mg via ORAL
  Filled 2019-10-30: qty 1

## 2019-10-30 MED ORDER — FENTANYL CITRATE (PF) 100 MCG/2ML IJ SOLN
25.0000 ug | INTRAMUSCULAR | Status: DC | PRN
  Administered 2019-10-30 (×2): 50 ug via INTRAVENOUS

## 2019-10-30 MED ORDER — LIDOCAINE 2% (20 MG/ML) 5 ML SYRINGE
INTRAMUSCULAR | Status: DC | PRN
  Administered 2019-10-30: 40 mg via INTRAVENOUS
  Administered 2019-10-30: 60 mg via INTRAVENOUS

## 2019-10-30 MED ORDER — MIDAZOLAM HCL 5 MG/5ML IJ SOLN
INTRAMUSCULAR | Status: DC | PRN
  Administered 2019-10-30: 2 mg via INTRAVENOUS

## 2019-10-30 MED ORDER — GABAPENTIN 100 MG PO CAPS
200.0000 mg | ORAL_CAPSULE | Freq: Every day | ORAL | Status: DC
  Administered 2019-10-30: 200 mg via ORAL
  Filled 2019-10-30: qty 2

## 2019-10-30 MED ORDER — CLONIDINE HCL 0.2 MG PO TABS: 0.3000 mg | ORAL_TABLET | Freq: Every day | ORAL | Status: DC

## 2019-10-30 MED ORDER — EPHEDRINE SULFATE-NACL 50-0.9 MG/10ML-% IV SOSY
PREFILLED_SYRINGE | INTRAVENOUS | Status: DC | PRN
  Administered 2019-10-30 (×2): 10 mg via INTRAVENOUS

## 2019-10-30 MED ORDER — DEXAMETHASONE SODIUM PHOSPHATE 10 MG/ML IJ SOLN
INTRAMUSCULAR | Status: DC | PRN
  Administered 2019-10-30: 5 mg via INTRAVENOUS

## 2019-10-30 MED ORDER — NITROGLYCERIN 0.4 MG SL SUBL: 0.4000 mg | SUBLINGUAL_TABLET | SUBLINGUAL | Status: DC | PRN

## 2019-10-30 MED ORDER — OXYCODONE HCL 5 MG PO TABS
ORAL_TABLET | ORAL | Status: AC
  Filled 2019-10-30: qty 1

## 2019-10-30 MED ORDER — ONDANSETRON HCL 4 MG/2ML IJ SOLN: 4.0000 mg | Freq: Four times a day (QID) | INTRAMUSCULAR | Status: DC | PRN

## 2019-10-30 MED ORDER — PROPOFOL 10 MG/ML IV BOLUS
INTRAVENOUS | Status: AC
  Filled 2019-10-30: qty 20

## 2019-10-30 MED ORDER — PROPOFOL 500 MG/50ML IV EMUL
INTRAVENOUS | Status: DC | PRN
  Administered 2019-10-30: 75 ug/kg/min via INTRAVENOUS

## 2019-10-30 MED ORDER — LIDOCAINE 2% (20 MG/ML) 5 ML SYRINGE
INTRAMUSCULAR | Status: AC
  Filled 2019-10-30: qty 5

## 2019-10-30 MED ORDER — CALCIUM ACETATE (PHOS BINDER) 667 MG PO CAPS
667.0000 mg | ORAL_CAPSULE | Freq: Three times a day (TID) | ORAL | Status: DC | PRN
  Filled 2019-10-30: qty 1

## 2019-10-30 MED ORDER — EPHEDRINE 5 MG/ML INJ
INTRAVENOUS | Status: AC
  Filled 2019-10-30: qty 10

## 2019-10-30 MED ORDER — FENTANYL CITRATE (PF) 100 MCG/2ML IJ SOLN
INTRAMUSCULAR | Status: AC
  Filled 2019-10-30: qty 2

## 2019-10-30 MED ORDER — PANTOPRAZOLE SODIUM 40 MG PO TBEC
40.0000 mg | DELAYED_RELEASE_TABLET | Freq: Every day | ORAL | Status: DC
  Administered 2019-10-30: 40 mg via ORAL
  Filled 2019-10-30: qty 1

## 2019-10-30 MED ORDER — SODIUM CHLORIDE 0.9% FLUSH
3.0000 mL | Freq: Two times a day (BID) | INTRAVENOUS | Status: DC
  Administered 2019-10-30: 3 mL via INTRAVENOUS

## 2019-10-30 MED ORDER — ACETAMINOPHEN 325 MG PO TABS: 325.0000 mg | ORAL_TABLET | ORAL | Status: DC | PRN

## 2019-10-30 MED ORDER — HEPARIN SODIUM (PORCINE) 1000 UNIT/ML IJ SOLN
INTRAMUSCULAR | Status: AC
  Filled 2019-10-30: qty 1

## 2019-10-30 MED ORDER — IODIXANOL 320 MG/ML IV SOLN
INTRAVENOUS | Status: DC | PRN
  Administered 2019-10-30: 10 mL via INTRAVENOUS

## 2019-10-30 MED ORDER — PROPOFOL 10 MG/ML IV BOLUS
INTRAVENOUS | Status: DC | PRN
  Administered 2019-10-30: 30 mg via INTRAVENOUS
  Administered 2019-10-30: 70 mg via INTRAVENOUS

## 2019-10-30 MED ORDER — SODIUM CHLORIDE 0.9% FLUSH: 3.0000 mL | INTRAVENOUS | Status: DC | PRN

## 2019-10-30 MED ORDER — LIDOCAINE-EPINEPHRINE 0.5 %-1:200000 IJ SOLN
INTRAMUSCULAR | Status: DC | PRN
  Administered 2019-10-30: 50 mL

## 2019-10-30 MED ORDER — SODIUM CHLORIDE 0.9 % IV SOLN
INTRAVENOUS | Status: AC
  Filled 2019-10-30: qty 1.2

## 2019-10-30 MED ORDER — HYDRALAZINE HCL 50 MG PO TABS
75.0000 mg | ORAL_TABLET | Freq: Three times a day (TID) | ORAL | Status: DC
  Administered 2019-10-30 – 2019-10-31 (×3): 75 mg via ORAL
  Filled 2019-10-30 (×3): qty 1

## 2019-10-30 MED ORDER — PHENYLEPHRINE 40 MCG/ML (10ML) SYRINGE FOR IV PUSH (FOR BLOOD PRESSURE SUPPORT)
PREFILLED_SYRINGE | INTRAVENOUS | Status: DC | PRN
  Administered 2019-10-30 (×4): 80 ug via INTRAVENOUS

## 2019-10-30 MED ORDER — OXYCODONE HCL 5 MG PO TABS
5.0000 mg | ORAL_TABLET | ORAL | Status: DC | PRN
  Administered 2019-10-30: 5 mg via ORAL
  Administered 2019-10-30: 10 mg via ORAL
  Administered 2019-10-30: 5 mg via ORAL
  Administered 2019-10-30 – 2019-10-31 (×3): 10 mg via ORAL
  Filled 2019-10-30 (×4): qty 2

## 2019-10-30 MED ORDER — LIDOCAINE-EPINEPHRINE 0.5 %-1:200000 IJ SOLN
INTRAMUSCULAR | Status: AC
  Filled 2019-10-30: qty 1

## 2019-10-30 MED ORDER — SODIUM CHLORIDE 0.9 % IV SOLN
INTRAVENOUS | Status: DC | PRN
  Administered 2019-10-30: 500 mL

## 2019-10-30 MED ORDER — PHENOL 1.4 % MT LIQD: 1.0000 | OROMUCOSAL | Status: DC | PRN

## 2019-10-30 MED ORDER — GUAIFENESIN-DM 100-10 MG/5ML PO SYRP: 15.0000 mL | ORAL_SOLUTION | ORAL | Status: DC | PRN

## 2019-10-30 MED ORDER — ACETAMINOPHEN 650 MG RE SUPP: 325.0000 mg | RECTAL | Status: DC | PRN

## 2019-10-30 MED ORDER — 0.9 % SODIUM CHLORIDE (POUR BTL) OPTIME
TOPICAL | Status: DC | PRN
  Administered 2019-10-30: 1000 mL

## 2019-10-30 MED ORDER — DOCUSATE SODIUM 100 MG PO CAPS
100.0000 mg | ORAL_CAPSULE | Freq: Two times a day (BID) | ORAL | Status: DC
  Administered 2019-10-30: 100 mg via ORAL
  Filled 2019-10-30: qty 1

## 2019-10-30 MED ORDER — MORPHINE SULFATE (PF) 2 MG/ML IV SOLN
2.0000 mg | INTRAVENOUS | Status: DC | PRN
  Administered 2019-10-30 (×2): 2 mg via INTRAVENOUS
  Administered 2019-10-30: 4 mg via INTRAVENOUS
  Administered 2019-10-30 – 2019-10-31 (×3): 2 mg via INTRAVENOUS
  Filled 2019-10-30: qty 2
  Filled 2019-10-30 (×5): qty 1

## 2019-10-30 SURGICAL SUPPLY — 68 items
ARMBAND PINK RESTRICT EXTREMIT (MISCELLANEOUS) ×6 IMPLANT
BAG DECANTER FOR FLEXI CONT (MISCELLANEOUS) ×3 IMPLANT
BIOPATCH RED 1 DISK 7.0 (GAUZE/BANDAGES/DRESSINGS) ×3 IMPLANT
CANISTER SUCT 3000ML PPV (MISCELLANEOUS) ×3 IMPLANT
CANNULA VESSEL 3MM 2 BLNT TIP (CANNULA) ×3 IMPLANT
CATH BEACON 5 .035 40 KMP TP (CATHETERS) IMPLANT
CATH BEACON 5 .038 40 KMP TP (CATHETERS) ×1
CATH PALINDROME RT-P 15FX19CM (CATHETERS) IMPLANT
CATH PALINDROME RT-P 15FX23CM (CATHETERS) IMPLANT
CATH PALINDROME RT-P 15FX28CM (CATHETERS) ×1 IMPLANT
CATH PALINDROME RT-P 15FX55CM (CATHETERS) IMPLANT
CATH SOFT-VU 4F 65 STRAIGHT (CATHETERS) IMPLANT
CATH SOFT-VU STRAIGHT 4F 65CM (CATHETERS) ×1
CLIP LIGATING EXTRA MED SLVR (CLIP) ×3 IMPLANT
CLIP LIGATING EXTRA SM BLUE (MISCELLANEOUS) ×3 IMPLANT
COVER PROBE W GEL 5X96 (DRAPES) ×3 IMPLANT
COVER SURGICAL LIGHT HANDLE (MISCELLANEOUS) ×3 IMPLANT
COVER WAND RF STERILE (DRAPES) ×2 IMPLANT
DECANTER SPIKE VIAL GLASS SM (MISCELLANEOUS) ×3 IMPLANT
DERMABOND ADVANCED (GAUZE/BANDAGES/DRESSINGS) ×2
DERMABOND ADVANCED .7 DNX12 (GAUZE/BANDAGES/DRESSINGS) IMPLANT
DEVICE TORQUE KENDALL .025-038 (MISCELLANEOUS) ×1 IMPLANT
DRAPE C-ARM 42X72 X-RAY (DRAPES) ×3 IMPLANT
DRAPE CHEST BREAST 15X10 FENES (DRAPES) ×3 IMPLANT
DRSG COVADERM PLUS 2X2 (GAUZE/BANDAGES/DRESSINGS) ×1 IMPLANT
ELECT REM PT RETURN 9FT ADLT (ELECTROSURGICAL) ×3
ELECTRODE REM PT RTRN 9FT ADLT (ELECTROSURGICAL) ×2 IMPLANT
GAUZE 4X4 16PLY RFD (DISPOSABLE) ×3 IMPLANT
GAUZE SPONGE 4X4 12PLY STRL (GAUZE/BANDAGES/DRESSINGS) ×3 IMPLANT
GLOVE BIO SURGEON STRL SZ 6.5 (GLOVE) ×3 IMPLANT
GLOVE BIOGEL PI IND STRL 6.5 (GLOVE) IMPLANT
GLOVE BIOGEL PI IND STRL 7.0 (GLOVE) IMPLANT
GLOVE BIOGEL PI INDICATOR 6.5 (GLOVE) ×6
GLOVE BIOGEL PI INDICATOR 7.0 (GLOVE) ×2
GLOVE SS BIOGEL STRL SZ 7.5 (GLOVE) ×2 IMPLANT
GLOVE SUPERSENSE BIOGEL SZ 7.5 (GLOVE) ×2
GOWN STRL REUS W/ TWL LRG LVL3 (GOWN DISPOSABLE) ×6 IMPLANT
GOWN STRL REUS W/TWL LRG LVL3 (GOWN DISPOSABLE) ×6
GRAFT GORETEX STRT 4-7X45 (Vascular Products) ×1 IMPLANT
GUIDEWIRE ANGLED .035X150CM (WIRE) ×1 IMPLANT
KIT BASIN OR (CUSTOM PROCEDURE TRAY) ×3 IMPLANT
KIT TURNOVER KIT B (KITS) ×3 IMPLANT
NDL 18GX1X1/2 (RX/OR ONLY) (NEEDLE) ×2 IMPLANT
NDL HYPO 25GX1X1/2 BEV (NEEDLE) ×2 IMPLANT
NEEDLE 18GX1X1/2 (RX/OR ONLY) (NEEDLE) ×3 IMPLANT
NEEDLE 22X1 1/2 (OR ONLY) (NEEDLE) IMPLANT
NEEDLE HYPO 25GX1X1/2 BEV (NEEDLE) ×3 IMPLANT
NS IRRIG 1000ML POUR BTL (IV SOLUTION) ×3 IMPLANT
PACK CV ACCESS (CUSTOM PROCEDURE TRAY) ×3 IMPLANT
PACK SURGICAL SETUP 50X90 (CUSTOM PROCEDURE TRAY) ×3 IMPLANT
PAD ARMBOARD 7.5X6 YLW CONV (MISCELLANEOUS) ×6 IMPLANT
SOAP 2 % CHG 4 OZ (WOUND CARE) ×3 IMPLANT
SUT ETHILON 3 0 PS 1 (SUTURE) ×3 IMPLANT
SUT PROLENE 6 0 CC (SUTURE) ×6 IMPLANT
SUT SILK 2 0 PERMA HAND 18 BK (SUTURE) IMPLANT
SUT VIC AB 3-0 SH 27 (SUTURE) ×2
SUT VIC AB 3-0 SH 27X BRD (SUTURE) ×4 IMPLANT
SUT VICRYL 4-0 PS2 18IN ABS (SUTURE) ×3 IMPLANT
SYR 10ML LL (SYRINGE) ×3 IMPLANT
SYR 20ML LL LF (SYRINGE) ×3 IMPLANT
SYR 5ML LL (SYRINGE) ×6 IMPLANT
SYR CONTROL 10ML LL (SYRINGE) ×3 IMPLANT
SYR TOOMEY 50ML (SYRINGE) IMPLANT
TOWEL GREEN STERILE (TOWEL DISPOSABLE) ×6 IMPLANT
TOWEL GREEN STERILE FF (TOWEL DISPOSABLE) ×3 IMPLANT
UNDERPAD 30X30 (UNDERPADS AND DIAPERS) ×3 IMPLANT
WATER STERILE IRR 1000ML POUR (IV SOLUTION) ×3 IMPLANT
WIRE BENTSON .035X145CM (WIRE) ×1 IMPLANT

## 2019-10-30 NOTE — Anesthesia Procedure Notes (Signed)
Procedure Name: MAC Date/Time: 10/30/2019 7:35 AM Performed by: Trinna Post., CRNA Pre-anesthesia Checklist: Patient identified, Emergency Drugs available, Suction available, Patient being monitored and Timeout performed Patient Re-evaluated:Patient Re-evaluated prior to induction Oxygen Delivery Method: Simple face mask Preoxygenation: Pre-oxygenation with 100% oxygen Induction Type: IV induction Placement Confirmation: positive ETCO2

## 2019-10-30 NOTE — Anesthesia Procedure Notes (Signed)
Procedure Name: LMA Insertion Date/Time: 10/30/2019 7:49 AM Performed by: Trinna Post., CRNA Pre-anesthesia Checklist: Patient identified, Emergency Drugs available, Suction available, Patient being monitored and Timeout performed Patient Re-evaluated:Patient Re-evaluated prior to induction Oxygen Delivery Method: Circle system utilized Preoxygenation: Pre-oxygenation with 100% oxygen Induction Type: IV induction LMA: LMA inserted LMA Size: 4.0 Number of attempts: 1 Placement Confirmation: positive ETCO2 and breath sounds checked- equal and bilateral Tube secured with: Tape Dental Injury: Teeth and Oropharynx as per pre-operative assessment

## 2019-10-30 NOTE — Op Note (Signed)
    OPERATIVE REPORT  DATE OF SURGERY: 10/30/2019  PATIENT: Patrick Anthony, 49 y.o. 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  PHYSICIAN ASSISTANT: CoreyBaglia PA-C  ANESTHESIA: LMA  EBL: per anesthesia record  Total I/O In: 700 [I.V.:600; IV Piggyback:100] Out: 50 [Blood:50]  BLOOD ADMINISTERED: none  DRAINS: none  SPECIMEN: none  COUNTS CORRECT:  YES  PATIENT DISPOSITION:  PACU - hemodynamically stable  PROCEDURE DETAILS: Patient was taken operating placed to position where the area of the right and left neck and chest prepped draped in a sterile fashion.  SonoSite ultrasound revealed widely patent internal jugular veins bilaterally.  The right internal jugular vein was accessed with an 18-gauge needle.  A guidewire was passed centrally but not passed into the heart by fluoroscopy.  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  For this reason the left internal jugular vein was accessed.  A guidewire was passed down to the level of the heart.  The guidewire would not track into the right atrium.  A guiding catheter was passed over the guidewire and with manipulation was able to be passed into the heart.  A dilator and peel-away sheath was passed over the guidewire and a 28 cm tunnel catheter was positioned with the tips at the level of the distal right atrium.  The catheter was brought through subcutaneous tunnel through a separate stab incision.  2 lm ports were attached and both lumens flushed and aspirated easily and were locked 1000 100 cc heparin.  The catheter was secured to the skin with a 3-0 nylon stitch and the entry site was closed with a 4-0  silk subcuticular Vicryl stitch  Attention was then turned to the left arm which was prepped and draped in usual sterile fashion.  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  An incision was made near the brachial artery over the vein which was of very large caliber.  And a separate incision was made at the deltopectoral groove.  The cephalic vein was isolated to this area as well.  A tunnel was created lateral to the aneurysmal fistula and a 7 mm Gore-Tex graft was brought through the tunnel.  The cephalic vein was occluded proximally at the deltopectoral groove and distally just above the brachial artery anastomosis.  The vein was transected proximally and distally.  The graft was sewn into into the cephalic vein at the deltopectoral groove with a running 6-0 Prolene suture.  This anastomosis was tested and found to be adequate.  The graft was flushed with heparinized saline and reoccluded.  Next the graft was cut to the appropriate length and was sewn into into the cephalic vein just above the brachial artery anastomosis.  This again was with a 6-0 Prolene suture.  Clamps removed and excellent thrill was noted.  The wounds irrigated with saline.  Hemostasis obtained after cautery.  The wounds were closed with 3-0 Vicryl in the subcutaneous and subcuticular tissue.  Sterile dressing was applied and the patient was transferred to the recovery room where chest x-rays pending   Rosetta Posner, M.D., [facility] 10/30/2019 10:17 AM

## 2019-10-30 NOTE — Progress Notes (Signed)
Pt. States declined for anyone to get text messages for surgery updates.

## 2019-10-30 NOTE — Plan of Care (Signed)

## 2019-10-30 NOTE — Discharge Instructions (Signed)
° °  [facility] ° °Discharge Instructions ° °AV Fistula or Graft Surgery for Dialysis Access ° °Please refer to the following instructions for your post-procedure care. Your surgeon or physician assistant will discuss any changes with you. ° °Activity ° °You may drive the day following your surgery, if you are comfortable and no longer taking prescription pain medication. Resume full activity as the soreness in your incision resolves. ° °Bathing/Showering ° °You may shower after you go home. Keep your incision dry for 48 hours. Do not soak in a bathtub, hot tub, or swim until the incision heals completely. You may not shower if you have a hemodialysis catheter. ° °Incision Care ° °Clean your incision with mild soap and water after 48 hours. Pat the area dry with a clean towel. You do not need a bandage unless otherwise instructed. Do not apply any ointments or creams to your incision. You may have skin glue on your incision. Do not peel it off. It will come off on its own in about one week. Your arm may swell a bit after surgery. To reduce swelling use pillows to elevate your arm so it is above your heart. Your doctor will tell you if you need to lightly wrap your arm with an ACE bandage. ° °Diet ° °Resume your normal diet. There are not special food restrictions following this procedure. In order to heal from your surgery, it is CRITICAL to get adequate nutrition. Your body requires vitamins, minerals, and protein. Vegetables are the best source of vitamins and minerals. Vegetables also provide the perfect balance of protein. Processed food has little nutritional value, so try to avoid this. ° °Medications ° °Resume taking all of your medications. If your incision is causing pain, you may take over-the counter pain relievers such as acetaminophen (Tylenol). If you were prescribed a stronger pain medication, please be aware these medications can cause nausea and constipation. Prevent  nausea by taking the medication with a snack or meal. Avoid constipation by drinking plenty of fluids and eating foods with high amount of fiber, such as fruits, vegetables, and grains. Do not take Tylenol if you are taking prescription pain medications. ° ° ° ° °Follow up °Your surgeon may want to see you in the office following your access surgery. If so, this will be arranged at the time of your surgery. ° °Please call us immediately for any of the following conditions: ° °Increased pain, redness, drainage (pus) from your incision site °Fever of 101 degrees or higher °Severe or worsening pain at your incision site °Hand pain or numbness. ° °Reduce your risk of vascular disease: ° °Stop smoking. If you would like help, call [facility] at 1-800-QUIT-NOW (1-800-784-8669) or [facility] at 336-586-4000 ° °Manage your cholesterol °Maintain a desired weight °Control your diabetes °Keep your blood pressure down ° °Dialysis ° °It will take several weeks to several months for your new dialysis access to be ready for use. Your surgeon will determine when it is OK to use it. Your nephrologist will continue to direct your dialysis. You can continue to use your Permcath until your new access is ready for use. ° °If you have any questions, please call the office at [phone]. ° °

## 2019-10-30 NOTE — Progress Notes (Signed)
Informed of patient's OBS admission s/p [facility] cath placement and new left upper AVGG - he is scheduled for Friday am dialysis at [facility].  Terri Piedra, SW is working on arrangements for him to get him there first or 2nds shift tomorrow.  Amalia Hailey, [facility]

## 2019-10-30 NOTE — Interval H&P Note (Signed)
History and Physical Interval Note:  10/30/2019 7:17 AM  Patrick Anthony  has presented today for surgery, with the diagnosis of END STAGE RENAL DISEASE.  The various methods of treatment have been discussed with the patient and family. After consideration of risks, benefits and other options for treatment, the patient has consented to  Procedure(s): INSERTION OF ARTERIOVENOUS (AV) GORE-TEX GRAFT ARM (Left) INSERTION OF DIALYSIS CATHETER (N/A) as a surgical intervention.  The patient's history has been reviewed, patient examined, no change in status, stable for surgery.  I have reviewed the patient's chart and labs.  Questions were answered to the patient's satisfaction.     Curt Jews

## 2019-10-30 NOTE — Transfer of Care (Signed)
Immediate Anesthesia Transfer of Care Note  Patient: Patrick Anthony  Procedure(s) Performed: INSERTION OF ARTERIOVENOUS (AV) GORE-TEX GRAFT ARM (Left Arm Upper) INSERTION OF DIALYSIS CATHETER LEFT INTERNAL JUGULAR (Left Neck)  Patient Location: PACU  Anesthesia Type:General  Level of Consciousness: awake, alert  and oriented  Airway & Oxygen Therapy: Patient Spontanous Breathing and Patient connected to face mask oxygen  Post-op Assessment: Report given to RN and Post -op Vital signs reviewed and stable  Post vital signs: Reviewed and stable  Last Vitals:  Vitals Value Taken Time  BP 108/47 10/30/19 1010  Temp 36.4 C 10/30/19 1010  Pulse 88 10/30/19 1013  Resp 24 10/30/19 1013  SpO2 100 % 10/30/19 1013  Vitals shown include unvalidated device data.  Last Pain:  Vitals:   10/30/19 0643  PainSc: 0-No pain      Patients Stated Pain Goal: 2 (00/34/91 7915)  Complications: No apparent anesthesia complications

## 2019-10-31 DIAGNOSIS — T829XXA Unspecified complication of cardiac and vascular prosthetic device, implant and graft, initial encounter: Secondary | ICD-10-CM | POA: Insufficient documentation

## 2019-10-31 DIAGNOSIS — T82510A Breakdown (mechanical) of surgically created arteriovenous fistula, initial encounter: Secondary | ICD-10-CM | POA: Diagnosis not present

## 2019-10-31 LAB — COMPREHENSIVE METABOLIC PANEL
ALT: 13 U/L (ref 0–44)
AST: 23 U/L (ref 15–41)
Albumin: 3.5 g/dL (ref 3.5–5.0)
Alkaline Phosphatase: 43 U/L (ref 38–126)
Anion gap: 18 — ABNORMAL HIGH (ref 5–15)
BUN: 53 mg/dL — ABNORMAL HIGH (ref 6–20)
CO2: 25 mmol/L (ref 22–32)
Calcium: 8.5 mg/dL — ABNORMAL LOW (ref 8.9–10.3)
Chloride: 89 mmol/L — ABNORMAL LOW (ref 98–111)
Creatinine, Ser: 11.79 mg/dL — ABNORMAL HIGH (ref 0.61–1.24)
GFR calc Af Amer: 5 mL/min — ABNORMAL LOW (ref >60)
GFR calc non Af Amer: 4 mL/min — ABNORMAL LOW (ref >60)
Glucose, Bld: 107 mg/dL — ABNORMAL HIGH (ref 70–99)
Potassium: 6.2 mmol/L — ABNORMAL HIGH (ref 3.5–5.1)
Sodium: 132 mmol/L — ABNORMAL LOW (ref 135–145)
Total Bilirubin: 0.7 mg/dL (ref 0.3–1.2)
Total Protein: 8.7 g/dL — ABNORMAL HIGH (ref 6.5–8.1)

## 2019-10-31 MED ORDER — SODIUM ZIRCONIUM CYCLOSILICATE 5 G PO PACK
5.0000 g | PACK | Freq: Once | ORAL | Status: DC
  Filled 2019-10-31: qty 1

## 2019-10-31 MED ORDER — SODIUM ZIRCONIUM CYCLOSILICATE 10 G PO PACK
10.0000 g | PACK | Freq: Once | ORAL | Status: AC
  Administered 2019-10-31: 10 g via ORAL
  Filled 2019-10-31 (×2): qty 1

## 2019-10-31 NOTE — Progress Notes (Signed)
  Progress Note    10/31/2019 7:27 AM 1 Day Post-Op  Subjective: comfortable. No major complaints overnight. Left upper arm soreness   Vitals:   10/31/19 0102 10/31/19 0402  BP: (!) 162/110 (!) 145/109  Pulse: 84 74  Resp: 18 16  Temp: (!) 97.4 F (36.3 C) 97.8 F (36.6 C)  SpO2: 97% 98%   Physical Exam: General: pleasant, well nourished, well appearing male Cardiac: left IJ TDC intact, some dried bloody drainage on dressing Lungs:  Non labored Incisions:  Left upper arm incisions clean, dry and intact. No hematoma. Minimal tenderness Extremities:  2+ left radial pulse, left hand warm. 5/5 grip strength. Normal motor and sensation. Palpable thrill in LUE graft Neurologic: Alert and oriented  CBC    Component Value Date/Time   WBC 8.4 10/30/2019 1453   RBC 4.38 10/30/2019 1453   HGB 12.9 (L) 10/30/2019 1453   HCT 40.9 10/30/2019 1453   PLT 272 10/30/2019 1453   MCV 93.4 10/30/2019 1453   MCH 29.5 10/30/2019 1453   MCHC 31.5 10/30/2019 1453   RDW 14.5 10/30/2019 1453   LYMPHSABS 0.9 02/13/2014 1444   MONOABS 0.3 02/13/2014 1444   EOSABS 0.2 02/13/2014 1444   BASOSABS 0.0 02/13/2014 1444    BMET    Component Value Date/Time   NA 132 (L) 10/31/2019 0252   K 6.2 (H) 10/31/2019 0252   CL 89 (L) 10/31/2019 0252   CO2 25 10/31/2019 0252   GLUCOSE 107 (H) 10/31/2019 0252   BUN 53 (H) 10/31/2019 0252   CREATININE 11.79 (H) 10/31/2019 0252   CALCIUM 8.5 (L) 10/31/2019 0252   GFRNONAA 4 (L) 10/31/2019 0252   GFRAA 5 (L) 10/31/2019 0252    INR    Component Value Date/Time   INR 1.0 10/30/2019 1453     Intake/Output Summary (Last 24 hours) at 10/31/2019 0727 Last data filed at 10/31/2019 0300 Gross per 24 hour  Intake 1060.76 ml  Output 50 ml  Net 1010.76 ml     Assessment/Plan:  49 y.o. male is s/p  Left IJ TDC and left upper arm AV Gore-Tex graft 1 Day Post-Op. Doing well post op. Was kept overnight in observation due to being without transportation. No  left upper extremity steal symptoms. Dressing was clean, dry and intact. Pressure dressing with ACE reapplied. Patient will discharge this morning. He will have outpatient dialysis today. He has a follow up in the office in 2-3 weeks   Karoline Caldwell, [place] [facility] [phone] 10/31/2019 7:27 AM

## 2019-10-31 NOTE — Progress Notes (Signed)
Patient's OP HD seat at [facility] has been rescheduled for today at 12:30pm. He needs to arrive at 12:10pm. Patient is aware and agreeable to going there straight from discharge from hospital today. His car is here and he will drive himself.  Alphonzo Cruise, [place] Ren[facility] Navigator [phone]

## 2019-10-31 NOTE — Anesthesia Postprocedure Evaluation (Signed)
Anesthesia Post Note  Patient: Patrick Anthony  Procedure(s) Performed: INSERTION OF ARTERIOVENOUS (AV) GORE-TEX GRAFT ARM (Left Arm Upper) INSERTION OF DIALYSIS CATHETER LEFT INTERNAL JUGULAR (Left Neck)     Patient location during evaluation: PACU Anesthesia Type: General Level of consciousness: awake and alert Pain management: pain level controlled Vital Signs Assessment: post-procedure vital signs reviewed and stable Respiratory status: spontaneous breathing, nonlabored ventilation, respiratory function stable and patient connected to nasal cannula oxygen Cardiovascular status: blood pressure returned to baseline and stable Postop Assessment: no apparent nausea or vomiting Anesthetic complications: no    Last Vitals:  Vitals:   10/31/19 0102 10/31/19 0402  BP: (!) 162/110 (!) 145/109  Pulse: 84 74  Resp: 18 16  Temp: (!) 36.3 C 36.6 C  SpO2: 97% 98%    Last Pain:  Vitals:   10/31/19 0706  TempSrc:   PainSc: 7                  Shreshta Medley S

## 2019-10-31 NOTE — Discharge Summary (Signed)
Discharge Summary    Patrick Anthony 08/30/1970 49 y.o. male  [id number]  Admission Date: 10/30/2019  Discharge Date: 10/31/19  Physician: Dr. Sherren Mocha Early  Admission Diagnosis: End stage renal disease [facility]) [N18.6]   HPI:   This is a 49 y.o. male  who presented to the office on 10/21/19 for evaluation of his AV access.  He had a left upper arm AV graft placed 6 years ago.  He has had excellent long-term use of this. 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  He denies any bleeding.  He does note purulent drainage from the upper portion at times after dialysis.  Hospital Course:  The patient was admitted to the hospital and taken to the operating room on 10/30/2019 and underwent left IJ tunneled hemodialysis catheter and left upper arm AV Gore-Tex graft.  The pt tolerated the procedure well and was transported to the PACU in good condition.   He was admitted for overnight observation due to lack of other transportation. He did well overnight with minimal surgical site discomfort. No steal symptoms in the left upper extremity. No upper extremity swelling or hematoma  The remainder of the hospital course consisted of increasing mobilization and increasing intake of solids without difficulty.  Nephrology was asked to assist in arrangement of outpatient HD as patient missed his regular dialysis time. He was scheduled at the [facility] for later today. He will discharge home today and is able to take himself to the dialysis center.  He will follow up in 3-4 weeks in the office for incision check CBC    Component Value Date/Time   WBC 8.4 10/30/2019 1453   RBC 4.38 10/30/2019 1453   HGB 12.9 (L) 10/30/2019 1453   HCT 40.9 10/30/2019 1453   PLT 272 10/30/2019 1453   MCV 93.4 10/30/2019 1453   MCH 29.5 10/30/2019 1453   MCHC 31.5 10/30/2019 1453   RDW 14.5 10/30/2019 1453   LYMPHSABS 0.9 02/13/2014 1444   MONOABS 0.3 02/13/2014 1444    EOSABS 0.2 02/13/2014 1444   BASOSABS 0.0 02/13/2014 1444    BMET    Component Value Date/Time   NA 132 (L) 10/31/2019 0252   K 6.2 (H) 10/31/2019 0252   CL 89 (L) 10/31/2019 0252   CO2 25 10/31/2019 0252   GLUCOSE 107 (H) 10/31/2019 0252   BUN 53 (H) 10/31/2019 0252   CREATININE 11.79 (H) 10/31/2019 0252   CALCIUM 8.5 (L) 10/31/2019 0252   GFRNONAA 4 (L) 10/31/2019 0252   GFRAA 5 (L) 10/31/2019 0252      Discharge Instructions    Discharge patient   Complete by: As directed    Once outpatient dialysis arranged   Discharge disposition: 01-Home or Self Care   Discharge patient date: 10/31/2019      Discharge Diagnosis:  End stage renal disease ([place]) [N18.6]  Secondary Diagnosis: Patient Active Problem List   Diagnosis Date Noted  . Chest pain 05/03/2017  . End stage renal disease ([place]) 10/17/2013  . Anemia in chronic kidney disease 08/30/2013  . Hepatitis C 08/30/2013  . ESRD on dialysis ([place]) 08/30/2013  . Hypertensive crisis 08/28/2013  . Hypertensive emergency 04/20/2013  . Chronic combined systolic and diastolic heart failure ([place]) 04/19/2013  . HTN (hypertension) 04/19/2013   Past Medical History:  Diagnosis Date  . Chronic kidney disease    patient on dialysis tues-thurs-sat  . Cirrhosis of liver ([place])   . Drug addiction ([place])   .  ETOH abuse   . Hepatitis   . Hypertension   . Renal disorder renal failure  . Sleep apnea   . Systolic and diastolic CHF, 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[place]) 04/19/2013     Allergies as of 10/31/2019   No Known Allergies     Medication List    TAKE these medications   amLODipine 5 MG tablet Commonly known as: NORVASC Take 1 tablet (5 mg total) by mouth daily.   calcium acetate 667 MG capsule Commonly known as: PHOSLO Take 3 capsules (2,001 mg total) by mouth 3 (three) times daily with meals. What changed:   how much to take  when to take this  additional instructions   cloNIDine 0.3 MG tablet Commonly known as:  CATAPRES Take 0.3 mg by mouth in the morning, at noon, and at bedtime.   gabapentin 100 MG capsule Commonly known as: NEURONTIN Take 200 mg by mouth at bedtime.   hydrALAZINE 50 MG tablet Commonly known as: APRESOLINE Take 75 mg by mouth in the morning, at noon, and at bedtime.   metoprolol tartrate 25 MG tablet Commonly known as: LOPRESSOR Take 75 mg by mouth 2 (two) times daily.   nitroGLYCERIN 0.4 MG SL tablet Commonly known as: NITROSTAT Place 0.4 mg under the tongue every 5 (five) minutes x 3 doses as needed for chest pain.   oxyCODONE-acetaminophen 7.5-325 MG tablet Commonly known as: Percocet Take 1 tablet by mouth every 4 (four) hours as needed for severe pain.   Sensipar 30 MG tablet Generic drug: cinacalcet Take 30 mg by mouth daily.   Viagra 50 MG tablet Generic drug: sildenafil Take 50 mg by mouth daily as needed for erectile dysfunction.        Instructions: [facility] Discharge Instructions AV Fistula or Graft Surgery for Dialysis Access  Please refer to the following instructions for your post-procedure care. Your surgeon or physician assistant will discuss any changes with you.  Activity  You may drive the day following your surgery, if you are comfortable and no longer taking prescription pain medication. Resume full activity as the soreness in your incision resolves.  Bathing/Showering  You may shower after you go home. Keep your incision dry for 48 hours. Do not soak in a bathtub, hot tub, or swim until the incision heals completely. You may not shower if you have a hemodialysis catheter.  Incision Care  Clean your incision with mild soap and water after 48 hours. Pat the area dry with a clean towel. You do not need a bandage unless otherwise instructed. Do not apply any ointments or creams to your incision. You may have skin glue on your incision. Do not peel it off. It will come off on its own in about one week.  Your arm may swell a bit after surgery. To reduce swelling use pillows to elevate your arm so it is above your heart. Your doctor will tell you if you need to lightly wrap your arm with an ACE bandage.  Diet  Resume your normal diet. There are not special food restrictions following this procedure. In order to heal from your surgery, it is CRITICAL to get adequate nutrition. Your body requires vitamins, minerals, and protein. Vegetables are the best source of vitamins and minerals. Vegetables also provide the perfect balance of protein. Processed food has little nutritional value, so try to avoid this.  Medications  Resume taking all of your medications. If your incision is causing pain, you may take  over-the counter pain relievers such as acetaminophen (Tylenol). If you were prescribed a stronger pain medication, please be aware these medications can cause nausea and constipation. Prevent nausea by taking the medication with a snack or meal. Avoid constipation by drinking plenty of fluids and eating foods with high amount of fiber, such as fruits, vegetables, and grains. Do not take Tylenol if you are taking prescription pain medications.  Follow up Your surgeon may want to see you in the office following your access surgery. If so, this will be arranged at the time of your surgery.  Please call us immediately for any of the following conditions:  . Increased pain, redness, drainage (pus) from your incision site . Fever of 101 degrees or higher . Severe or worsening pain at your incision site . Hand pain or numbness. .  Reduce your risk of vascular disease:  . Stop smoking. If you would like help, call [facility] at 1-800-QUIT-NOW (719)111-2188) or [place] at (707) 173-5945  . Manage your cholesterol . Maintain a desired weight . Control your diabetes . Keep your blood pressure down  Dialysis  It will take several weeks to several months for your new dialysis access to be ready  for use. Your surgeon will determine when it is OK to use it. Your nephrologist will continue to direct your dialysis. You can continue to use your Permcath until your new access is ready for use.   10/31/2019 Caelan Branden [id number] February 26, 1971  Surgeon(s): Early, Arvilla Meres, MD  Procedure(s): INSERTION OF ARTERIOVENOUS (AV) GORE-TEX GRAFT ARM INSERTION OF DIALYSIS CATHETER LEFT INTERNAL JUGULAR   May stick graft immediately   May stick graft on designated area only:    Do not stick fistula for 4 weeks    If you have any questions, please call the office at [phone].  Prescriptions given: Oxycodone- Acetaminophen 7.5-325 take 1 tablet every 4-6 hours as needed for moderate to severe pain #20 No Refill  Disposition: Home  Patient's condition: is Good  Follow up: 1. Dr. Donnetta Hutching in 3-4 weeks   Leontine Locket, PA-C [facility] [phone] 10/31/2019  1:38 PM

## 2019-10-31 NOTE — Progress Notes (Signed)
Renal Navigator aware that patient is here after VVS procedure with plan to be in OBS until tomorrow. Navigator met with patient to make plan for OP HD treatment at his home clinic tomorrow after discharge. Patient agreeable and appreciative.  Navigator spoke with [facility] to make aware that patient will not be at his first shift (normal) appointment tomorrow, and will need a second shift seat if all goes as planned for discharge in the morning. Renal Navigator will call in the morning to confirm. Patient was talkative about his experience with HD over the past 5-6 years. Navigator provided space for him to share his feelings. Patient seemed appreciative of support offered. Navigator will follow up in the am.  Alphonzo Cruise, [place] [facility]igator [phone]

## 2019-11-04 ENCOUNTER — Telehealth: Payer: Self-pay | Admitting: Nephrology

## 2019-11-04 NOTE — Telephone Encounter (Signed)
Transition of care contact from inpatient facility  Date of Discharge: 10/31/19 Date of Contact: 11/04/19 Method of contact: Phone  Attempted to contact patient to discuss transition of care from inpatient admission. Patient did not answer the phone. Message was left on the patient's voicemail with call back number [phone]

## 2019-11-05 DIAGNOSIS — Z992 Dependence on renal dialysis: Secondary | ICD-10-CM | POA: Diagnosis not present

## 2019-11-05 DIAGNOSIS — I129 Hypertensive chronic kidney disease with stage 1 through stage 4 chronic kidney disease, or unspecified chronic kidney disease: Secondary | ICD-10-CM | POA: Diagnosis not present

## 2019-11-05 DIAGNOSIS — N186 End stage renal disease: Secondary | ICD-10-CM | POA: Diagnosis not present

## 2019-11-13 ENCOUNTER — Telehealth (HOSPITAL_COMMUNITY): Payer: Self-pay | Admitting: Cardiology

## 2019-11-13 NOTE — Telephone Encounter (Signed)
Called patient to reschedule Amyloid on April 15th per request of Nuclear Supervisor and scheduled patient for April 13th.  Left message for  patient to call office to get new appt date and time.

## 2019-11-14 DIAGNOSIS — N186 End stage renal disease: Secondary | ICD-10-CM | POA: Diagnosis not present

## 2019-11-14 DIAGNOSIS — N2581 Secondary hyperparathyroidism of renal origin: Secondary | ICD-10-CM | POA: Diagnosis not present

## 2019-11-17 ENCOUNTER — Encounter (HOSPITAL_COMMUNITY): Payer: Medicaid Other

## 2019-11-17 ENCOUNTER — Telehealth (HOSPITAL_COMMUNITY): Payer: Self-pay

## 2019-11-17 DIAGNOSIS — E875 Hyperkalemia: Secondary | ICD-10-CM | POA: Insufficient documentation

## 2019-11-17 NOTE — Telephone Encounter (Signed)
Detailed instructions left on the patient's answering machine. Asked to call back with any questions. S.Kourtnei Rauber EMTP 

## 2019-11-18 ENCOUNTER — Encounter (HOSPITAL_COMMUNITY): Payer: Medicaid Other

## 2019-11-20 ENCOUNTER — Encounter (HOSPITAL_COMMUNITY): Payer: Medicaid Other

## 2019-11-27 ENCOUNTER — Other Ambulatory Visit: Payer: Self-pay

## 2019-11-27 ENCOUNTER — Ambulatory Visit (HOSPITAL_COMMUNITY): Payer: Medicaid Other | Attending: Cardiology

## 2019-11-27 DIAGNOSIS — I10 Essential (primary) hypertension: Secondary | ICD-10-CM | POA: Insufficient documentation

## 2019-11-27 DIAGNOSIS — R079 Chest pain, unspecified: Secondary | ICD-10-CM | POA: Insufficient documentation

## 2019-11-27 DIAGNOSIS — I5032 Chronic diastolic (congestive) heart failure: Secondary | ICD-10-CM | POA: Insufficient documentation

## 2019-11-27 MED ORDER — TECHNETIUM TC 99M PYROPHOSPHATE
20.4000 | Freq: Once | INTRAVENOUS | Status: AC
  Administered 2019-11-27: 11:00:00 20.4 via INTRAVENOUS

## 2019-12-03 ENCOUNTER — Telehealth: Payer: Self-pay

## 2019-12-03 DIAGNOSIS — I5042 Chronic combined systolic (congestive) and diastolic (congestive) heart failure: Secondary | ICD-10-CM

## 2019-12-03 DIAGNOSIS — R079 Chest pain, unspecified: Secondary | ICD-10-CM

## 2019-12-03 NOTE — Telephone Encounter (Signed)
The patient has been notified of the result and verbalized understanding.  All questions (if any) were answered. Antonieta Iba, RN 12/03/2019 10:20 AM  The patient states that he no longer urinates. Will place order for SPEP.

## 2019-12-03 NOTE — Telephone Encounter (Signed)
-----   Message from Sueanne Margarita, MD sent at 11/28/2019  8:40 AM EDT ----- Equivocal study for amyloid.  Please order an SPEP and UPEP

## 2019-12-04 ENCOUNTER — Other Ambulatory Visit: Payer: Medicaid Other | Admitting: *Deleted

## 2019-12-04 ENCOUNTER — Other Ambulatory Visit: Payer: Self-pay

## 2019-12-04 DIAGNOSIS — I5042 Chronic combined systolic (congestive) and diastolic (congestive) heart failure: Secondary | ICD-10-CM

## 2019-12-04 DIAGNOSIS — R079 Chest pain, unspecified: Secondary | ICD-10-CM

## 2019-12-05 DIAGNOSIS — Z992 Dependence on renal dialysis: Secondary | ICD-10-CM | POA: Diagnosis not present

## 2019-12-05 DIAGNOSIS — I129 Hypertensive chronic kidney disease with stage 1 through stage 4 chronic kidney disease, or unspecified chronic kidney disease: Secondary | ICD-10-CM | POA: Diagnosis not present

## 2019-12-05 DIAGNOSIS — N186 End stage renal disease: Secondary | ICD-10-CM | POA: Diagnosis not present

## 2019-12-08 DIAGNOSIS — Z992 Dependence on renal dialysis: Secondary | ICD-10-CM | POA: Diagnosis not present

## 2019-12-08 DIAGNOSIS — N2581 Secondary hyperparathyroidism of renal origin: Secondary | ICD-10-CM | POA: Diagnosis not present

## 2019-12-08 DIAGNOSIS — N186 End stage renal disease: Secondary | ICD-10-CM | POA: Diagnosis not present

## 2019-12-08 LAB — PROTEIN ELECTROPHORESIS, SERUM
A/G Ratio: 0.9 (ref 0.7–1.7)
Albumin ELP: 4.1 g/dL (ref 2.9–4.4)
Alpha 1: 0.2 g/dL (ref 0.0–0.4)
Alpha 2: 0.8 g/dL (ref 0.4–1.0)
Beta: 1.2 g/dL (ref 0.7–1.3)
Gamma Globulin: 2.5 g/dL — ABNORMAL HIGH (ref 0.4–1.8)
Globulin, Total: 4.8 g/dL — ABNORMAL HIGH (ref 2.2–3.9)
Total Protein: 8.9 g/dL — ABNORMAL HIGH (ref 6.0–8.5)

## 2019-12-12 DIAGNOSIS — N2581 Secondary hyperparathyroidism of renal origin: Secondary | ICD-10-CM | POA: Diagnosis not present

## 2019-12-12 DIAGNOSIS — Z992 Dependence on renal dialysis: Secondary | ICD-10-CM | POA: Diagnosis not present

## 2019-12-12 DIAGNOSIS — N186 End stage renal disease: Secondary | ICD-10-CM | POA: Diagnosis not present

## 2019-12-15 DIAGNOSIS — N2581 Secondary hyperparathyroidism of renal origin: Secondary | ICD-10-CM | POA: Diagnosis not present

## 2019-12-15 DIAGNOSIS — N186 End stage renal disease: Secondary | ICD-10-CM | POA: Diagnosis not present

## 2019-12-15 DIAGNOSIS — Z992 Dependence on renal dialysis: Secondary | ICD-10-CM | POA: Diagnosis not present

## 2019-12-17 DIAGNOSIS — N186 End stage renal disease: Secondary | ICD-10-CM | POA: Diagnosis not present

## 2019-12-17 DIAGNOSIS — Z992 Dependence on renal dialysis: Secondary | ICD-10-CM | POA: Diagnosis not present

## 2019-12-17 DIAGNOSIS — N2581 Secondary hyperparathyroidism of renal origin: Secondary | ICD-10-CM | POA: Diagnosis not present

## 2019-12-19 DIAGNOSIS — Z992 Dependence on renal dialysis: Secondary | ICD-10-CM | POA: Diagnosis not present

## 2019-12-19 DIAGNOSIS — N2581 Secondary hyperparathyroidism of renal origin: Secondary | ICD-10-CM | POA: Diagnosis not present

## 2019-12-19 DIAGNOSIS — N186 End stage renal disease: Secondary | ICD-10-CM | POA: Diagnosis not present

## 2019-12-22 DIAGNOSIS — Z992 Dependence on renal dialysis: Secondary | ICD-10-CM | POA: Diagnosis not present

## 2019-12-22 DIAGNOSIS — N2581 Secondary hyperparathyroidism of renal origin: Secondary | ICD-10-CM | POA: Diagnosis not present

## 2019-12-22 DIAGNOSIS — N186 End stage renal disease: Secondary | ICD-10-CM | POA: Diagnosis not present

## 2019-12-24 DIAGNOSIS — Z992 Dependence on renal dialysis: Secondary | ICD-10-CM | POA: Diagnosis not present

## 2019-12-24 DIAGNOSIS — N2581 Secondary hyperparathyroidism of renal origin: Secondary | ICD-10-CM | POA: Diagnosis not present

## 2019-12-24 DIAGNOSIS — N186 End stage renal disease: Secondary | ICD-10-CM | POA: Diagnosis not present

## 2019-12-26 DIAGNOSIS — Z992 Dependence on renal dialysis: Secondary | ICD-10-CM | POA: Diagnosis not present

## 2019-12-26 DIAGNOSIS — N186 End stage renal disease: Secondary | ICD-10-CM | POA: Diagnosis not present

## 2019-12-26 DIAGNOSIS — N2581 Secondary hyperparathyroidism of renal origin: Secondary | ICD-10-CM | POA: Diagnosis not present

## 2019-12-31 DIAGNOSIS — N2581 Secondary hyperparathyroidism of renal origin: Secondary | ICD-10-CM | POA: Diagnosis not present

## 2019-12-31 DIAGNOSIS — N186 End stage renal disease: Secondary | ICD-10-CM | POA: Diagnosis not present

## 2019-12-31 DIAGNOSIS — Z992 Dependence on renal dialysis: Secondary | ICD-10-CM | POA: Diagnosis not present

## 2020-01-02 DIAGNOSIS — Z992 Dependence on renal dialysis: Secondary | ICD-10-CM | POA: Diagnosis not present

## 2020-01-02 DIAGNOSIS — N186 End stage renal disease: Secondary | ICD-10-CM | POA: Diagnosis not present

## 2020-01-02 DIAGNOSIS — N2581 Secondary hyperparathyroidism of renal origin: Secondary | ICD-10-CM | POA: Diagnosis not present

## 2020-01-05 DIAGNOSIS — N2581 Secondary hyperparathyroidism of renal origin: Secondary | ICD-10-CM | POA: Diagnosis not present

## 2020-01-05 DIAGNOSIS — Z992 Dependence on renal dialysis: Secondary | ICD-10-CM | POA: Diagnosis not present

## 2020-01-05 DIAGNOSIS — N186 End stage renal disease: Secondary | ICD-10-CM | POA: Diagnosis not present

## 2020-01-05 DIAGNOSIS — I129 Hypertensive chronic kidney disease with stage 1 through stage 4 chronic kidney disease, or unspecified chronic kidney disease: Secondary | ICD-10-CM | POA: Diagnosis not present

## 2020-01-08 ENCOUNTER — Encounter: Payer: Self-pay | Admitting: Surgery

## 2020-01-08 DIAGNOSIS — N5201 Erectile dysfunction due to arterial insufficiency: Secondary | ICD-10-CM | POA: Diagnosis not present

## 2020-01-12 DIAGNOSIS — N2581 Secondary hyperparathyroidism of renal origin: Secondary | ICD-10-CM | POA: Diagnosis not present

## 2020-01-12 DIAGNOSIS — Z992 Dependence on renal dialysis: Secondary | ICD-10-CM | POA: Diagnosis not present

## 2020-01-12 DIAGNOSIS — N186 End stage renal disease: Secondary | ICD-10-CM | POA: Diagnosis not present

## 2020-01-13 DIAGNOSIS — N2581 Secondary hyperparathyroidism of renal origin: Secondary | ICD-10-CM | POA: Diagnosis not present

## 2020-01-13 DIAGNOSIS — N186 End stage renal disease: Secondary | ICD-10-CM | POA: Diagnosis not present

## 2020-01-13 DIAGNOSIS — Z992 Dependence on renal dialysis: Secondary | ICD-10-CM | POA: Diagnosis not present

## 2020-01-14 DIAGNOSIS — N186 End stage renal disease: Secondary | ICD-10-CM | POA: Diagnosis not present

## 2020-01-14 DIAGNOSIS — N2581 Secondary hyperparathyroidism of renal origin: Secondary | ICD-10-CM | POA: Diagnosis not present

## 2020-01-14 DIAGNOSIS — Z992 Dependence on renal dialysis: Secondary | ICD-10-CM | POA: Diagnosis not present

## 2020-01-16 DIAGNOSIS — N5201 Erectile dysfunction due to arterial insufficiency: Secondary | ICD-10-CM | POA: Diagnosis not present

## 2020-01-19 DIAGNOSIS — N2581 Secondary hyperparathyroidism of renal origin: Secondary | ICD-10-CM | POA: Diagnosis not present

## 2020-01-19 DIAGNOSIS — Z992 Dependence on renal dialysis: Secondary | ICD-10-CM | POA: Diagnosis not present

## 2020-01-19 DIAGNOSIS — N186 End stage renal disease: Secondary | ICD-10-CM | POA: Diagnosis not present

## 2020-01-20 DIAGNOSIS — Z452 Encounter for adjustment and management of vascular access device: Secondary | ICD-10-CM | POA: Diagnosis not present

## 2020-01-23 DIAGNOSIS — Z992 Dependence on renal dialysis: Secondary | ICD-10-CM | POA: Diagnosis not present

## 2020-01-23 DIAGNOSIS — N2581 Secondary hyperparathyroidism of renal origin: Secondary | ICD-10-CM | POA: Diagnosis not present

## 2020-01-23 DIAGNOSIS — N186 End stage renal disease: Secondary | ICD-10-CM | POA: Diagnosis not present

## 2020-01-26 ENCOUNTER — Encounter: Payer: Self-pay | Admitting: Surgery

## 2020-01-26 DIAGNOSIS — Z992 Dependence on renal dialysis: Secondary | ICD-10-CM | POA: Diagnosis not present

## 2020-01-26 DIAGNOSIS — N186 End stage renal disease: Secondary | ICD-10-CM | POA: Diagnosis not present

## 2020-01-26 DIAGNOSIS — N2581 Secondary hyperparathyroidism of renal origin: Secondary | ICD-10-CM | POA: Diagnosis not present

## 2020-01-30 ENCOUNTER — Encounter: Payer: Self-pay | Admitting: Surgery

## 2020-01-30 DIAGNOSIS — N2581 Secondary hyperparathyroidism of renal origin: Secondary | ICD-10-CM | POA: Diagnosis not present

## 2020-01-30 DIAGNOSIS — N186 End stage renal disease: Secondary | ICD-10-CM | POA: Diagnosis not present

## 2020-01-30 DIAGNOSIS — Z992 Dependence on renal dialysis: Secondary | ICD-10-CM | POA: Diagnosis not present

## 2020-02-04 DIAGNOSIS — I129 Hypertensive chronic kidney disease with stage 1 through stage 4 chronic kidney disease, or unspecified chronic kidney disease: Secondary | ICD-10-CM | POA: Diagnosis not present

## 2020-02-04 DIAGNOSIS — N186 End stage renal disease: Secondary | ICD-10-CM | POA: Diagnosis not present

## 2020-02-04 DIAGNOSIS — Z992 Dependence on renal dialysis: Secondary | ICD-10-CM | POA: Diagnosis not present

## 2020-02-04 DIAGNOSIS — N2581 Secondary hyperparathyroidism of renal origin: Secondary | ICD-10-CM | POA: Diagnosis not present

## 2020-02-06 DIAGNOSIS — N186 End stage renal disease: Secondary | ICD-10-CM | POA: Diagnosis not present

## 2020-02-06 DIAGNOSIS — E875 Hyperkalemia: Secondary | ICD-10-CM | POA: Diagnosis not present

## 2020-02-06 DIAGNOSIS — I129 Hypertensive chronic kidney disease with stage 1 through stage 4 chronic kidney disease, or unspecified chronic kidney disease: Secondary | ICD-10-CM | POA: Diagnosis not present

## 2020-02-06 DIAGNOSIS — D689 Coagulation defect, unspecified: Secondary | ICD-10-CM | POA: Diagnosis not present

## 2020-02-06 DIAGNOSIS — D631 Anemia in chronic kidney disease: Secondary | ICD-10-CM | POA: Diagnosis not present

## 2020-02-06 DIAGNOSIS — Z992 Dependence on renal dialysis: Secondary | ICD-10-CM | POA: Diagnosis not present

## 2020-02-06 DIAGNOSIS — N2581 Secondary hyperparathyroidism of renal origin: Secondary | ICD-10-CM | POA: Diagnosis not present

## 2020-02-12 DIAGNOSIS — N186 End stage renal disease: Secondary | ICD-10-CM | POA: Diagnosis not present

## 2020-02-12 DIAGNOSIS — I129 Hypertensive chronic kidney disease with stage 1 through stage 4 chronic kidney disease, or unspecified chronic kidney disease: Secondary | ICD-10-CM | POA: Diagnosis not present

## 2020-02-12 DIAGNOSIS — Z992 Dependence on renal dialysis: Secondary | ICD-10-CM | POA: Diagnosis not present

## 2020-02-12 DIAGNOSIS — D631 Anemia in chronic kidney disease: Secondary | ICD-10-CM | POA: Diagnosis not present

## 2020-02-12 DIAGNOSIS — D689 Coagulation defect, unspecified: Secondary | ICD-10-CM | POA: Diagnosis not present

## 2020-02-12 DIAGNOSIS — N2581 Secondary hyperparathyroidism of renal origin: Secondary | ICD-10-CM | POA: Diagnosis not present

## 2020-02-12 DIAGNOSIS — E875 Hyperkalemia: Secondary | ICD-10-CM | POA: Diagnosis not present

## 2020-02-16 DIAGNOSIS — D631 Anemia in chronic kidney disease: Secondary | ICD-10-CM | POA: Diagnosis not present

## 2020-02-16 DIAGNOSIS — E875 Hyperkalemia: Secondary | ICD-10-CM | POA: Diagnosis not present

## 2020-02-16 DIAGNOSIS — N186 End stage renal disease: Secondary | ICD-10-CM | POA: Diagnosis not present

## 2020-02-16 DIAGNOSIS — I129 Hypertensive chronic kidney disease with stage 1 through stage 4 chronic kidney disease, or unspecified chronic kidney disease: Secondary | ICD-10-CM | POA: Diagnosis not present

## 2020-02-16 DIAGNOSIS — N2581 Secondary hyperparathyroidism of renal origin: Secondary | ICD-10-CM | POA: Diagnosis not present

## 2020-02-16 DIAGNOSIS — Z992 Dependence on renal dialysis: Secondary | ICD-10-CM | POA: Diagnosis not present

## 2020-02-16 DIAGNOSIS — D689 Coagulation defect, unspecified: Secondary | ICD-10-CM | POA: Diagnosis not present

## 2020-02-23 DIAGNOSIS — N186 End stage renal disease: Secondary | ICD-10-CM | POA: Diagnosis not present

## 2020-02-23 DIAGNOSIS — Z992 Dependence on renal dialysis: Secondary | ICD-10-CM | POA: Diagnosis not present

## 2020-02-23 DIAGNOSIS — N2581 Secondary hyperparathyroidism of renal origin: Secondary | ICD-10-CM | POA: Diagnosis not present

## 2020-02-23 DIAGNOSIS — I129 Hypertensive chronic kidney disease with stage 1 through stage 4 chronic kidney disease, or unspecified chronic kidney disease: Secondary | ICD-10-CM | POA: Diagnosis not present

## 2020-02-23 DIAGNOSIS — E875 Hyperkalemia: Secondary | ICD-10-CM | POA: Diagnosis not present

## 2020-02-23 DIAGNOSIS — D689 Coagulation defect, unspecified: Secondary | ICD-10-CM | POA: Diagnosis not present

## 2020-02-23 DIAGNOSIS — D631 Anemia in chronic kidney disease: Secondary | ICD-10-CM | POA: Diagnosis not present

## 2020-02-25 DIAGNOSIS — D689 Coagulation defect, unspecified: Secondary | ICD-10-CM | POA: Diagnosis not present

## 2020-02-25 DIAGNOSIS — N186 End stage renal disease: Secondary | ICD-10-CM | POA: Diagnosis not present

## 2020-02-25 DIAGNOSIS — Z992 Dependence on renal dialysis: Secondary | ICD-10-CM | POA: Diagnosis not present

## 2020-02-25 DIAGNOSIS — N2581 Secondary hyperparathyroidism of renal origin: Secondary | ICD-10-CM | POA: Diagnosis not present

## 2020-02-25 DIAGNOSIS — E875 Hyperkalemia: Secondary | ICD-10-CM | POA: Diagnosis not present

## 2020-02-25 DIAGNOSIS — D631 Anemia in chronic kidney disease: Secondary | ICD-10-CM | POA: Diagnosis not present

## 2020-02-25 DIAGNOSIS — I129 Hypertensive chronic kidney disease with stage 1 through stage 4 chronic kidney disease, or unspecified chronic kidney disease: Secondary | ICD-10-CM | POA: Diagnosis not present

## 2020-02-27 DIAGNOSIS — N186 End stage renal disease: Secondary | ICD-10-CM | POA: Diagnosis not present

## 2020-02-27 DIAGNOSIS — N2581 Secondary hyperparathyroidism of renal origin: Secondary | ICD-10-CM | POA: Diagnosis not present

## 2020-02-27 DIAGNOSIS — I129 Hypertensive chronic kidney disease with stage 1 through stage 4 chronic kidney disease, or unspecified chronic kidney disease: Secondary | ICD-10-CM | POA: Diagnosis not present

## 2020-02-27 DIAGNOSIS — D689 Coagulation defect, unspecified: Secondary | ICD-10-CM | POA: Diagnosis not present

## 2020-02-27 DIAGNOSIS — D631 Anemia in chronic kidney disease: Secondary | ICD-10-CM | POA: Diagnosis not present

## 2020-02-27 DIAGNOSIS — E875 Hyperkalemia: Secondary | ICD-10-CM | POA: Diagnosis not present

## 2020-02-27 DIAGNOSIS — Z992 Dependence on renal dialysis: Secondary | ICD-10-CM | POA: Diagnosis not present

## 2020-03-01 DIAGNOSIS — N186 End stage renal disease: Secondary | ICD-10-CM | POA: Diagnosis not present

## 2020-03-01 DIAGNOSIS — D631 Anemia in chronic kidney disease: Secondary | ICD-10-CM | POA: Diagnosis not present

## 2020-03-01 DIAGNOSIS — N2581 Secondary hyperparathyroidism of renal origin: Secondary | ICD-10-CM | POA: Diagnosis not present

## 2020-03-01 DIAGNOSIS — D689 Coagulation defect, unspecified: Secondary | ICD-10-CM | POA: Diagnosis not present

## 2020-03-01 DIAGNOSIS — Z992 Dependence on renal dialysis: Secondary | ICD-10-CM | POA: Diagnosis not present

## 2020-03-01 DIAGNOSIS — I129 Hypertensive chronic kidney disease with stage 1 through stage 4 chronic kidney disease, or unspecified chronic kidney disease: Secondary | ICD-10-CM | POA: Diagnosis not present

## 2020-03-01 DIAGNOSIS — E875 Hyperkalemia: Secondary | ICD-10-CM | POA: Diagnosis not present

## 2020-03-03 DIAGNOSIS — D689 Coagulation defect, unspecified: Secondary | ICD-10-CM | POA: Diagnosis not present

## 2020-03-03 DIAGNOSIS — E875 Hyperkalemia: Secondary | ICD-10-CM | POA: Diagnosis not present

## 2020-03-03 DIAGNOSIS — N186 End stage renal disease: Secondary | ICD-10-CM | POA: Diagnosis not present

## 2020-03-03 DIAGNOSIS — Z992 Dependence on renal dialysis: Secondary | ICD-10-CM | POA: Diagnosis not present

## 2020-03-03 DIAGNOSIS — N2581 Secondary hyperparathyroidism of renal origin: Secondary | ICD-10-CM | POA: Diagnosis not present

## 2020-03-03 DIAGNOSIS — I129 Hypertensive chronic kidney disease with stage 1 through stage 4 chronic kidney disease, or unspecified chronic kidney disease: Secondary | ICD-10-CM | POA: Diagnosis not present

## 2020-03-03 DIAGNOSIS — D631 Anemia in chronic kidney disease: Secondary | ICD-10-CM | POA: Diagnosis not present

## 2020-03-05 DIAGNOSIS — Z992 Dependence on renal dialysis: Secondary | ICD-10-CM | POA: Diagnosis not present

## 2020-03-05 DIAGNOSIS — D631 Anemia in chronic kidney disease: Secondary | ICD-10-CM | POA: Diagnosis not present

## 2020-03-05 DIAGNOSIS — N186 End stage renal disease: Secondary | ICD-10-CM | POA: Diagnosis not present

## 2020-03-05 DIAGNOSIS — D689 Coagulation defect, unspecified: Secondary | ICD-10-CM | POA: Diagnosis not present

## 2020-03-05 DIAGNOSIS — I129 Hypertensive chronic kidney disease with stage 1 through stage 4 chronic kidney disease, or unspecified chronic kidney disease: Secondary | ICD-10-CM | POA: Diagnosis not present

## 2020-03-05 DIAGNOSIS — N2581 Secondary hyperparathyroidism of renal origin: Secondary | ICD-10-CM | POA: Diagnosis not present

## 2020-03-05 DIAGNOSIS — E875 Hyperkalemia: Secondary | ICD-10-CM | POA: Diagnosis not present

## 2020-03-06 DIAGNOSIS — N186 End stage renal disease: Secondary | ICD-10-CM | POA: Diagnosis not present

## 2020-03-06 DIAGNOSIS — I129 Hypertensive chronic kidney disease with stage 1 through stage 4 chronic kidney disease, or unspecified chronic kidney disease: Secondary | ICD-10-CM | POA: Diagnosis not present

## 2020-03-06 DIAGNOSIS — Z992 Dependence on renal dialysis: Secondary | ICD-10-CM | POA: Diagnosis not present

## 2020-03-08 DIAGNOSIS — E875 Hyperkalemia: Secondary | ICD-10-CM | POA: Diagnosis not present

## 2020-03-08 DIAGNOSIS — N186 End stage renal disease: Secondary | ICD-10-CM | POA: Diagnosis not present

## 2020-03-08 DIAGNOSIS — N2581 Secondary hyperparathyroidism of renal origin: Secondary | ICD-10-CM | POA: Diagnosis not present

## 2020-03-08 DIAGNOSIS — Z992 Dependence on renal dialysis: Secondary | ICD-10-CM | POA: Diagnosis not present

## 2020-03-08 DIAGNOSIS — D689 Coagulation defect, unspecified: Secondary | ICD-10-CM | POA: Diagnosis not present

## 2020-03-10 DIAGNOSIS — Z992 Dependence on renal dialysis: Secondary | ICD-10-CM | POA: Diagnosis not present

## 2020-03-10 DIAGNOSIS — E875 Hyperkalemia: Secondary | ICD-10-CM | POA: Diagnosis not present

## 2020-03-10 DIAGNOSIS — N186 End stage renal disease: Secondary | ICD-10-CM | POA: Diagnosis not present

## 2020-03-10 DIAGNOSIS — N2581 Secondary hyperparathyroidism of renal origin: Secondary | ICD-10-CM | POA: Diagnosis not present

## 2020-03-10 DIAGNOSIS — D689 Coagulation defect, unspecified: Secondary | ICD-10-CM | POA: Diagnosis not present

## 2020-03-12 DIAGNOSIS — E875 Hyperkalemia: Secondary | ICD-10-CM | POA: Diagnosis not present

## 2020-03-12 DIAGNOSIS — N2581 Secondary hyperparathyroidism of renal origin: Secondary | ICD-10-CM | POA: Diagnosis not present

## 2020-03-12 DIAGNOSIS — N186 End stage renal disease: Secondary | ICD-10-CM | POA: Diagnosis not present

## 2020-03-12 DIAGNOSIS — Z992 Dependence on renal dialysis: Secondary | ICD-10-CM | POA: Diagnosis not present

## 2020-03-12 DIAGNOSIS — D689 Coagulation defect, unspecified: Secondary | ICD-10-CM | POA: Diagnosis not present

## 2020-03-15 DIAGNOSIS — N2581 Secondary hyperparathyroidism of renal origin: Secondary | ICD-10-CM | POA: Diagnosis not present

## 2020-03-15 DIAGNOSIS — Z992 Dependence on renal dialysis: Secondary | ICD-10-CM | POA: Diagnosis not present

## 2020-03-15 DIAGNOSIS — D689 Coagulation defect, unspecified: Secondary | ICD-10-CM | POA: Diagnosis not present

## 2020-03-15 DIAGNOSIS — E875 Hyperkalemia: Secondary | ICD-10-CM | POA: Diagnosis not present

## 2020-03-15 DIAGNOSIS — N186 End stage renal disease: Secondary | ICD-10-CM | POA: Diagnosis not present

## 2020-03-17 DIAGNOSIS — Z992 Dependence on renal dialysis: Secondary | ICD-10-CM | POA: Diagnosis not present

## 2020-03-17 DIAGNOSIS — E875 Hyperkalemia: Secondary | ICD-10-CM | POA: Diagnosis not present

## 2020-03-17 DIAGNOSIS — D689 Coagulation defect, unspecified: Secondary | ICD-10-CM | POA: Diagnosis not present

## 2020-03-17 DIAGNOSIS — N186 End stage renal disease: Secondary | ICD-10-CM | POA: Diagnosis not present

## 2020-03-17 DIAGNOSIS — N2581 Secondary hyperparathyroidism of renal origin: Secondary | ICD-10-CM | POA: Diagnosis not present

## 2020-03-19 DIAGNOSIS — E875 Hyperkalemia: Secondary | ICD-10-CM | POA: Diagnosis not present

## 2020-03-19 DIAGNOSIS — Z992 Dependence on renal dialysis: Secondary | ICD-10-CM | POA: Diagnosis not present

## 2020-03-19 DIAGNOSIS — N2581 Secondary hyperparathyroidism of renal origin: Secondary | ICD-10-CM | POA: Diagnosis not present

## 2020-03-19 DIAGNOSIS — D689 Coagulation defect, unspecified: Secondary | ICD-10-CM | POA: Diagnosis not present

## 2020-03-19 DIAGNOSIS — N186 End stage renal disease: Secondary | ICD-10-CM | POA: Diagnosis not present

## 2020-03-22 DIAGNOSIS — D689 Coagulation defect, unspecified: Secondary | ICD-10-CM | POA: Diagnosis not present

## 2020-03-22 DIAGNOSIS — E875 Hyperkalemia: Secondary | ICD-10-CM | POA: Diagnosis not present

## 2020-03-22 DIAGNOSIS — Z992 Dependence on renal dialysis: Secondary | ICD-10-CM | POA: Diagnosis not present

## 2020-03-22 DIAGNOSIS — N2581 Secondary hyperparathyroidism of renal origin: Secondary | ICD-10-CM | POA: Diagnosis not present

## 2020-03-22 DIAGNOSIS — N186 End stage renal disease: Secondary | ICD-10-CM | POA: Diagnosis not present

## 2020-03-24 DIAGNOSIS — Z992 Dependence on renal dialysis: Secondary | ICD-10-CM | POA: Diagnosis not present

## 2020-03-24 DIAGNOSIS — E875 Hyperkalemia: Secondary | ICD-10-CM | POA: Diagnosis not present

## 2020-03-24 DIAGNOSIS — D689 Coagulation defect, unspecified: Secondary | ICD-10-CM | POA: Diagnosis not present

## 2020-03-24 DIAGNOSIS — N186 End stage renal disease: Secondary | ICD-10-CM | POA: Diagnosis not present

## 2020-03-24 DIAGNOSIS — N2581 Secondary hyperparathyroidism of renal origin: Secondary | ICD-10-CM | POA: Diagnosis not present

## 2020-03-26 DIAGNOSIS — E875 Hyperkalemia: Secondary | ICD-10-CM | POA: Diagnosis not present

## 2020-03-26 DIAGNOSIS — N186 End stage renal disease: Secondary | ICD-10-CM | POA: Diagnosis not present

## 2020-03-26 DIAGNOSIS — D689 Coagulation defect, unspecified: Secondary | ICD-10-CM | POA: Diagnosis not present

## 2020-03-26 DIAGNOSIS — N2581 Secondary hyperparathyroidism of renal origin: Secondary | ICD-10-CM | POA: Diagnosis not present

## 2020-03-26 DIAGNOSIS — Z992 Dependence on renal dialysis: Secondary | ICD-10-CM | POA: Diagnosis not present

## 2020-03-29 DIAGNOSIS — N2581 Secondary hyperparathyroidism of renal origin: Secondary | ICD-10-CM | POA: Diagnosis not present

## 2020-03-29 DIAGNOSIS — E875 Hyperkalemia: Secondary | ICD-10-CM | POA: Diagnosis not present

## 2020-03-29 DIAGNOSIS — Z992 Dependence on renal dialysis: Secondary | ICD-10-CM | POA: Diagnosis not present

## 2020-03-29 DIAGNOSIS — D689 Coagulation defect, unspecified: Secondary | ICD-10-CM | POA: Diagnosis not present

## 2020-03-29 DIAGNOSIS — N186 End stage renal disease: Secondary | ICD-10-CM | POA: Diagnosis not present

## 2020-04-02 DIAGNOSIS — N186 End stage renal disease: Secondary | ICD-10-CM | POA: Diagnosis not present

## 2020-04-02 DIAGNOSIS — D689 Coagulation defect, unspecified: Secondary | ICD-10-CM | POA: Diagnosis not present

## 2020-04-02 DIAGNOSIS — N2581 Secondary hyperparathyroidism of renal origin: Secondary | ICD-10-CM | POA: Diagnosis not present

## 2020-04-02 DIAGNOSIS — Z992 Dependence on renal dialysis: Secondary | ICD-10-CM | POA: Diagnosis not present

## 2020-04-02 DIAGNOSIS — E875 Hyperkalemia: Secondary | ICD-10-CM | POA: Diagnosis not present

## 2020-04-05 DIAGNOSIS — Z992 Dependence on renal dialysis: Secondary | ICD-10-CM | POA: Diagnosis not present

## 2020-04-05 DIAGNOSIS — N2581 Secondary hyperparathyroidism of renal origin: Secondary | ICD-10-CM | POA: Diagnosis not present

## 2020-04-05 DIAGNOSIS — D689 Coagulation defect, unspecified: Secondary | ICD-10-CM | POA: Diagnosis not present

## 2020-04-05 DIAGNOSIS — E875 Hyperkalemia: Secondary | ICD-10-CM | POA: Diagnosis not present

## 2020-04-05 DIAGNOSIS — N186 End stage renal disease: Secondary | ICD-10-CM | POA: Diagnosis not present

## 2020-04-06 DIAGNOSIS — Z992 Dependence on renal dialysis: Secondary | ICD-10-CM | POA: Diagnosis not present

## 2020-04-06 DIAGNOSIS — I129 Hypertensive chronic kidney disease with stage 1 through stage 4 chronic kidney disease, or unspecified chronic kidney disease: Secondary | ICD-10-CM | POA: Diagnosis not present

## 2020-04-06 DIAGNOSIS — N186 End stage renal disease: Secondary | ICD-10-CM | POA: Diagnosis not present

## 2020-04-09 DIAGNOSIS — N2581 Secondary hyperparathyroidism of renal origin: Secondary | ICD-10-CM | POA: Diagnosis not present

## 2020-04-09 DIAGNOSIS — D689 Coagulation defect, unspecified: Secondary | ICD-10-CM | POA: Diagnosis not present

## 2020-04-09 DIAGNOSIS — N186 End stage renal disease: Secondary | ICD-10-CM | POA: Diagnosis not present

## 2020-04-09 DIAGNOSIS — E875 Hyperkalemia: Secondary | ICD-10-CM | POA: Diagnosis not present

## 2020-04-09 DIAGNOSIS — Z992 Dependence on renal dialysis: Secondary | ICD-10-CM | POA: Diagnosis not present

## 2020-04-12 DIAGNOSIS — D689 Coagulation defect, unspecified: Secondary | ICD-10-CM | POA: Diagnosis not present

## 2020-04-12 DIAGNOSIS — E875 Hyperkalemia: Secondary | ICD-10-CM | POA: Diagnosis not present

## 2020-04-12 DIAGNOSIS — Z992 Dependence on renal dialysis: Secondary | ICD-10-CM | POA: Diagnosis not present

## 2020-04-12 DIAGNOSIS — N186 End stage renal disease: Secondary | ICD-10-CM | POA: Diagnosis not present

## 2020-04-12 DIAGNOSIS — N2581 Secondary hyperparathyroidism of renal origin: Secondary | ICD-10-CM | POA: Diagnosis not present

## 2020-04-14 DIAGNOSIS — N2581 Secondary hyperparathyroidism of renal origin: Secondary | ICD-10-CM | POA: Diagnosis not present

## 2020-04-14 DIAGNOSIS — N186 End stage renal disease: Secondary | ICD-10-CM | POA: Diagnosis not present

## 2020-04-14 DIAGNOSIS — E875 Hyperkalemia: Secondary | ICD-10-CM | POA: Diagnosis not present

## 2020-04-14 DIAGNOSIS — Z992 Dependence on renal dialysis: Secondary | ICD-10-CM | POA: Diagnosis not present

## 2020-04-14 DIAGNOSIS — D689 Coagulation defect, unspecified: Secondary | ICD-10-CM | POA: Diagnosis not present

## 2020-04-15 DIAGNOSIS — T7840XD Allergy, unspecified, subsequent encounter: Secondary | ICD-10-CM | POA: Insufficient documentation

## 2020-04-15 DIAGNOSIS — T782XXD Anaphylactic shock, unspecified, subsequent encounter: Secondary | ICD-10-CM | POA: Insufficient documentation

## 2020-04-16 DIAGNOSIS — D689 Coagulation defect, unspecified: Secondary | ICD-10-CM | POA: Diagnosis not present

## 2020-04-16 DIAGNOSIS — N2581 Secondary hyperparathyroidism of renal origin: Secondary | ICD-10-CM | POA: Diagnosis not present

## 2020-04-16 DIAGNOSIS — N186 End stage renal disease: Secondary | ICD-10-CM | POA: Diagnosis not present

## 2020-04-16 DIAGNOSIS — E875 Hyperkalemia: Secondary | ICD-10-CM | POA: Diagnosis not present

## 2020-04-16 DIAGNOSIS — Z992 Dependence on renal dialysis: Secondary | ICD-10-CM | POA: Diagnosis not present

## 2020-04-19 DIAGNOSIS — N2581 Secondary hyperparathyroidism of renal origin: Secondary | ICD-10-CM | POA: Diagnosis not present

## 2020-04-19 DIAGNOSIS — D689 Coagulation defect, unspecified: Secondary | ICD-10-CM | POA: Diagnosis not present

## 2020-04-19 DIAGNOSIS — Z992 Dependence on renal dialysis: Secondary | ICD-10-CM | POA: Diagnosis not present

## 2020-04-19 DIAGNOSIS — N186 End stage renal disease: Secondary | ICD-10-CM | POA: Diagnosis not present

## 2020-04-19 DIAGNOSIS — E875 Hyperkalemia: Secondary | ICD-10-CM | POA: Diagnosis not present

## 2020-04-23 DIAGNOSIS — N186 End stage renal disease: Secondary | ICD-10-CM | POA: Diagnosis not present

## 2020-04-23 DIAGNOSIS — Z992 Dependence on renal dialysis: Secondary | ICD-10-CM | POA: Diagnosis not present

## 2020-04-23 DIAGNOSIS — E875 Hyperkalemia: Secondary | ICD-10-CM | POA: Diagnosis not present

## 2020-04-23 DIAGNOSIS — D689 Coagulation defect, unspecified: Secondary | ICD-10-CM | POA: Diagnosis not present

## 2020-04-23 DIAGNOSIS — N2581 Secondary hyperparathyroidism of renal origin: Secondary | ICD-10-CM | POA: Diagnosis not present

## 2020-04-26 DIAGNOSIS — N186 End stage renal disease: Secondary | ICD-10-CM | POA: Diagnosis not present

## 2020-04-26 DIAGNOSIS — Z992 Dependence on renal dialysis: Secondary | ICD-10-CM | POA: Diagnosis not present

## 2020-04-26 DIAGNOSIS — N2581 Secondary hyperparathyroidism of renal origin: Secondary | ICD-10-CM | POA: Diagnosis not present

## 2020-04-26 DIAGNOSIS — D689 Coagulation defect, unspecified: Secondary | ICD-10-CM | POA: Diagnosis not present

## 2020-04-26 DIAGNOSIS — E875 Hyperkalemia: Secondary | ICD-10-CM | POA: Diagnosis not present

## 2020-04-30 DIAGNOSIS — Z992 Dependence on renal dialysis: Secondary | ICD-10-CM | POA: Diagnosis not present

## 2020-04-30 DIAGNOSIS — D689 Coagulation defect, unspecified: Secondary | ICD-10-CM | POA: Diagnosis not present

## 2020-04-30 DIAGNOSIS — E875 Hyperkalemia: Secondary | ICD-10-CM | POA: Diagnosis not present

## 2020-04-30 DIAGNOSIS — N186 End stage renal disease: Secondary | ICD-10-CM | POA: Diagnosis not present

## 2020-04-30 DIAGNOSIS — N2581 Secondary hyperparathyroidism of renal origin: Secondary | ICD-10-CM | POA: Diagnosis not present

## 2020-05-03 DIAGNOSIS — N2581 Secondary hyperparathyroidism of renal origin: Secondary | ICD-10-CM | POA: Diagnosis not present

## 2020-05-03 DIAGNOSIS — N186 End stage renal disease: Secondary | ICD-10-CM | POA: Diagnosis not present

## 2020-05-03 DIAGNOSIS — D689 Coagulation defect, unspecified: Secondary | ICD-10-CM | POA: Diagnosis not present

## 2020-05-03 DIAGNOSIS — Z992 Dependence on renal dialysis: Secondary | ICD-10-CM | POA: Diagnosis not present

## 2020-05-03 DIAGNOSIS — E875 Hyperkalemia: Secondary | ICD-10-CM | POA: Diagnosis not present

## 2020-05-06 DIAGNOSIS — I129 Hypertensive chronic kidney disease with stage 1 through stage 4 chronic kidney disease, or unspecified chronic kidney disease: Secondary | ICD-10-CM | POA: Diagnosis not present

## 2020-05-06 DIAGNOSIS — N186 End stage renal disease: Secondary | ICD-10-CM | POA: Diagnosis not present

## 2020-05-06 DIAGNOSIS — Z992 Dependence on renal dialysis: Secondary | ICD-10-CM | POA: Diagnosis not present

## 2020-06-06 DIAGNOSIS — Z992 Dependence on renal dialysis: Secondary | ICD-10-CM | POA: Diagnosis not present

## 2020-06-06 DIAGNOSIS — I129 Hypertensive chronic kidney disease with stage 1 through stage 4 chronic kidney disease, or unspecified chronic kidney disease: Secondary | ICD-10-CM | POA: Diagnosis not present

## 2020-06-06 DIAGNOSIS — N186 End stage renal disease: Secondary | ICD-10-CM | POA: Diagnosis not present

## 2020-07-06 DIAGNOSIS — I129 Hypertensive chronic kidney disease with stage 1 through stage 4 chronic kidney disease, or unspecified chronic kidney disease: Secondary | ICD-10-CM | POA: Diagnosis not present

## 2020-07-06 DIAGNOSIS — Z992 Dependence on renal dialysis: Secondary | ICD-10-CM | POA: Diagnosis not present

## 2020-07-06 DIAGNOSIS — N186 End stage renal disease: Secondary | ICD-10-CM | POA: Diagnosis not present

## 2020-07-12 DIAGNOSIS — Z992 Dependence on renal dialysis: Secondary | ICD-10-CM | POA: Diagnosis not present

## 2020-07-12 DIAGNOSIS — D631 Anemia in chronic kidney disease: Secondary | ICD-10-CM | POA: Diagnosis not present

## 2020-07-12 DIAGNOSIS — N2581 Secondary hyperparathyroidism of renal origin: Secondary | ICD-10-CM | POA: Diagnosis not present

## 2020-07-12 DIAGNOSIS — E875 Hyperkalemia: Secondary | ICD-10-CM | POA: Diagnosis not present

## 2020-07-12 DIAGNOSIS — N186 End stage renal disease: Secondary | ICD-10-CM | POA: Diagnosis not present

## 2020-07-12 DIAGNOSIS — D689 Coagulation defect, unspecified: Secondary | ICD-10-CM | POA: Diagnosis not present

## 2020-07-14 DIAGNOSIS — N2581 Secondary hyperparathyroidism of renal origin: Secondary | ICD-10-CM | POA: Diagnosis not present

## 2020-07-14 DIAGNOSIS — D689 Coagulation defect, unspecified: Secondary | ICD-10-CM | POA: Diagnosis not present

## 2020-07-14 DIAGNOSIS — N186 End stage renal disease: Secondary | ICD-10-CM | POA: Diagnosis not present

## 2020-07-14 DIAGNOSIS — E875 Hyperkalemia: Secondary | ICD-10-CM | POA: Diagnosis not present

## 2020-07-14 DIAGNOSIS — D631 Anemia in chronic kidney disease: Secondary | ICD-10-CM | POA: Diagnosis not present

## 2020-07-14 DIAGNOSIS — Z992 Dependence on renal dialysis: Secondary | ICD-10-CM | POA: Diagnosis not present

## 2020-07-19 DIAGNOSIS — D631 Anemia in chronic kidney disease: Secondary | ICD-10-CM | POA: Diagnosis not present

## 2020-07-19 DIAGNOSIS — D689 Coagulation defect, unspecified: Secondary | ICD-10-CM | POA: Diagnosis not present

## 2020-07-19 DIAGNOSIS — E875 Hyperkalemia: Secondary | ICD-10-CM | POA: Diagnosis not present

## 2020-07-19 DIAGNOSIS — N2581 Secondary hyperparathyroidism of renal origin: Secondary | ICD-10-CM | POA: Diagnosis not present

## 2020-07-19 DIAGNOSIS — N186 End stage renal disease: Secondary | ICD-10-CM | POA: Diagnosis not present

## 2020-07-19 DIAGNOSIS — Z992 Dependence on renal dialysis: Secondary | ICD-10-CM | POA: Diagnosis not present

## 2020-07-21 ENCOUNTER — Other Ambulatory Visit: Payer: Self-pay | Admitting: Cardiology

## 2020-07-21 DIAGNOSIS — N186 End stage renal disease: Secondary | ICD-10-CM | POA: Diagnosis not present

## 2020-07-21 DIAGNOSIS — Z992 Dependence on renal dialysis: Secondary | ICD-10-CM | POA: Diagnosis not present

## 2020-07-21 DIAGNOSIS — N2581 Secondary hyperparathyroidism of renal origin: Secondary | ICD-10-CM | POA: Diagnosis not present

## 2020-07-21 DIAGNOSIS — D631 Anemia in chronic kidney disease: Secondary | ICD-10-CM | POA: Diagnosis not present

## 2020-07-21 DIAGNOSIS — E875 Hyperkalemia: Secondary | ICD-10-CM | POA: Diagnosis not present

## 2020-07-21 DIAGNOSIS — D689 Coagulation defect, unspecified: Secondary | ICD-10-CM | POA: Diagnosis not present

## 2020-07-23 DIAGNOSIS — N186 End stage renal disease: Secondary | ICD-10-CM | POA: Diagnosis not present

## 2020-07-23 DIAGNOSIS — N2581 Secondary hyperparathyroidism of renal origin: Secondary | ICD-10-CM | POA: Diagnosis not present

## 2020-07-23 DIAGNOSIS — D631 Anemia in chronic kidney disease: Secondary | ICD-10-CM | POA: Diagnosis not present

## 2020-07-23 DIAGNOSIS — E875 Hyperkalemia: Secondary | ICD-10-CM | POA: Diagnosis not present

## 2020-07-23 DIAGNOSIS — Z992 Dependence on renal dialysis: Secondary | ICD-10-CM | POA: Diagnosis not present

## 2020-07-23 DIAGNOSIS — D689 Coagulation defect, unspecified: Secondary | ICD-10-CM | POA: Diagnosis not present

## 2020-07-26 DIAGNOSIS — D631 Anemia in chronic kidney disease: Secondary | ICD-10-CM | POA: Diagnosis not present

## 2020-07-26 DIAGNOSIS — N186 End stage renal disease: Secondary | ICD-10-CM | POA: Diagnosis not present

## 2020-07-26 DIAGNOSIS — N2581 Secondary hyperparathyroidism of renal origin: Secondary | ICD-10-CM | POA: Diagnosis not present

## 2020-07-26 DIAGNOSIS — Z992 Dependence on renal dialysis: Secondary | ICD-10-CM | POA: Diagnosis not present

## 2020-07-26 DIAGNOSIS — E875 Hyperkalemia: Secondary | ICD-10-CM | POA: Diagnosis not present

## 2020-07-26 DIAGNOSIS — D689 Coagulation defect, unspecified: Secondary | ICD-10-CM | POA: Diagnosis not present

## 2020-07-30 DIAGNOSIS — N186 End stage renal disease: Secondary | ICD-10-CM | POA: Diagnosis not present

## 2020-07-30 DIAGNOSIS — D689 Coagulation defect, unspecified: Secondary | ICD-10-CM | POA: Diagnosis not present

## 2020-07-30 DIAGNOSIS — D631 Anemia in chronic kidney disease: Secondary | ICD-10-CM | POA: Diagnosis not present

## 2020-07-30 DIAGNOSIS — Z992 Dependence on renal dialysis: Secondary | ICD-10-CM | POA: Diagnosis not present

## 2020-07-30 DIAGNOSIS — E875 Hyperkalemia: Secondary | ICD-10-CM | POA: Diagnosis not present

## 2020-07-30 DIAGNOSIS — N2581 Secondary hyperparathyroidism of renal origin: Secondary | ICD-10-CM | POA: Diagnosis not present

## 2020-08-04 DIAGNOSIS — N2581 Secondary hyperparathyroidism of renal origin: Secondary | ICD-10-CM | POA: Diagnosis not present

## 2020-08-04 DIAGNOSIS — Z992 Dependence on renal dialysis: Secondary | ICD-10-CM | POA: Diagnosis not present

## 2020-08-04 DIAGNOSIS — D631 Anemia in chronic kidney disease: Secondary | ICD-10-CM | POA: Diagnosis not present

## 2020-08-04 DIAGNOSIS — N186 End stage renal disease: Secondary | ICD-10-CM | POA: Diagnosis not present

## 2020-08-04 DIAGNOSIS — E875 Hyperkalemia: Secondary | ICD-10-CM | POA: Diagnosis not present

## 2020-08-04 DIAGNOSIS — D689 Coagulation defect, unspecified: Secondary | ICD-10-CM | POA: Diagnosis not present

## 2020-08-06 DIAGNOSIS — D631 Anemia in chronic kidney disease: Secondary | ICD-10-CM | POA: Diagnosis not present

## 2020-08-06 DIAGNOSIS — E875 Hyperkalemia: Secondary | ICD-10-CM | POA: Diagnosis not present

## 2020-08-06 DIAGNOSIS — N186 End stage renal disease: Secondary | ICD-10-CM | POA: Diagnosis not present

## 2020-08-06 DIAGNOSIS — D689 Coagulation defect, unspecified: Secondary | ICD-10-CM | POA: Diagnosis not present

## 2020-08-06 DIAGNOSIS — N2581 Secondary hyperparathyroidism of renal origin: Secondary | ICD-10-CM | POA: Diagnosis not present

## 2020-08-06 DIAGNOSIS — I129 Hypertensive chronic kidney disease with stage 1 through stage 4 chronic kidney disease, or unspecified chronic kidney disease: Secondary | ICD-10-CM | POA: Diagnosis not present

## 2020-08-06 DIAGNOSIS — Z992 Dependence on renal dialysis: Secondary | ICD-10-CM | POA: Diagnosis not present

## 2020-08-07 ENCOUNTER — Other Ambulatory Visit: Payer: Self-pay

## 2020-08-07 ENCOUNTER — Ambulatory Visit
Admission: EM | Admit: 2020-08-07 | Discharge: 2020-08-07 | Disposition: A | Discharge status: Home / Self Care | Payer: Medicaid Other

## 2020-08-07 DIAGNOSIS — M25411 Effusion, right shoulder: Secondary | ICD-10-CM

## 2020-08-07 NOTE — Discharge Instructions (Signed)
Talk to dialysis RN and doc about shoulder as they may need to do blood work and/or ultrasound

## 2020-08-07 NOTE — ED Provider Notes (Signed)
[facility]    CSN: [id number] Arrival date & time: 08/07/20  6759      History   Chief Complaint Chief Complaint  Patient presents with  . Cyst    HPI Patrick Anthony is a 50 y.o. male  With history as below, on hemodialysis Monday, Wednesday, Friday: Denies missing appointments, presenting for right anterior shoulder swelling.  Denies pain, numbness, deformity, injury.  Has not taken thing for this.  Past Medical History:  Diagnosis Date  . Chronic kidney disease    patient on dialysis tues-thurs-sat  . Cirrhosis of liver ([place])   . Drug addiction ([place])   . ETOH abuse   . Hepatitis   . Hypertension   . Renal disorder renal failure  . Sleep apnea   . Systolic and diastolic CHF, 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[place]) 04/19/2013    Patient Active Problem List   Diagnosis Date Noted  . Chest pain 05/03/2017  . End stage renal disease ([place]) 10/17/2013  . Anemia in chronic kidney disease 08/30/2013  . Hepatitis C 08/30/2013  . ESRD on dialysis ([place]) 08/30/2013  . Hypertensive crisis 08/28/2013  . Hypertensive emergency 04/20/2013  . Chronic combined systolic and diastolic heart failure ([place]) 04/19/2013  . HTN (hypertension) 04/19/2013    Past Surgical History:  Procedure Laterality Date  . AV FISTULA PLACEMENT Right   . AV FISTULA PLACEMENT Left 09/03/2013   Procedure: ARTERIOVENOUS (AV) FISTULA CREATION - LEFT BRACHIOCEPHALIC;  Surgeon: Conrad Rockwell, MD;  Location: [facility] OR;  Service: Vascular;  Laterality: Left;  . AV FISTULA PLACEMENT Left 10/30/2019   Procedure: INSERTION OF ARTERIOVENOUS (AV) GORE-TEX GRAFT ARM;  Surgeon: Rosetta Posner, MD;  Location: [facility];  Service: Vascular;  Laterality: Left;  . INSERTION OF DIALYSIS CATHETER N/A 08/29/2013   Procedure: INSERTION OF DIALYSIS CATHETER;  Surgeon: Mal Misty, MD;  Location: [facility];  Service: Vascular;  Laterality: N/A;  . INSERTION OF DIALYSIS CATHETER Left 10/30/2019   Procedure: INSERTION OF DIALYSIS CATHETER LEFT  INTERNAL JUGULAR;  Surgeon: Rosetta Posner, MD;  Location: [facility];  Service: Vascular;  Laterality: Left;  . PERIPHERAL VASCULAR CATHETERIZATION N/A 01/06/2015   Procedure: A/V Shuntogram/Fistulagram;  Surgeon: Katha Cabal, MD;  Location: [facility];  Service: Cardiovascular;  Laterality: N/A;  . PERIPHERAL VASCULAR CATHETERIZATION Left 01/06/2015   Procedure: A/V Shunt Intervention;  Surgeon: Katha Cabal, MD;  Location: [facility];  Service: Cardiovascular;  Laterality: Left;       Home Medications    Prior to Admission medications   Medication Sig Start Date End Date Taking? Authorizing Provider  Darbepoetin Alfa (ARANESP, ALBUMIN FREE, IJ) Darbepoetin Alfa (Aranesp) 07/05/20 07/04/21 Yes [provider]  ferric citrate (AURYXIA) 1 GM 210 MG(Fe) tablet Take by mouth. 01/05/20  Yes [provider]  heparin 1000 unit/mL SOLN injection Heparin Sodium (Porcine) 1,000 Units/mL Systemic 04/23/20 04/22/21 Yes [provider]  amLODipine (NORVASC) 5 MG tablet Take 1 tablet (5 mg total) by mouth daily. 10/28/19   Patrick Margarita, MD  AURYXIA 1 GM 210 MG(Fe) tablet Take 420 mg by mouth 3 (three) times daily. 07/20/20   [provider]  calcium acetate (PHOSLO) 667 MG capsule Take 3 capsules (2,001 mg total) by mouth 3 (three) times daily with meals. Patient taking differently: Take 667-2,001 mg by mouth See admin instructions. Take 3 capsules (2001 mg) by mouth with each meal & take 1 capsule (667 mg) by mouth with each snack  09/04/13   Ivor Costa, MD  cloNIDine (CATAPRES) 0.3 MG tablet Take 0.3 mg by mouth in the morning, at noon, and at bedtime.    [provider]  gabapentin (NEURONTIN) 100 MG capsule Take 200 mg by mouth at bedtime.    [provider]  hydrALAZINE (APRESOLINE) 50 MG tablet Take 75 mg by mouth in the morning, at noon, and at bedtime.  10/31/18   [provider]  metoprolol tartrate (LOPRESSOR) 25 MG  tablet Take 75 mg by mouth 2 (two) times daily.  04/12/16   [provider]  nitroGLYCERIN (NITROSTAT) 0.4 MG SL tablet Place 0.4 mg under the tongue every 5 (five) minutes x 3 doses as needed for chest pain.     [provider]  oxyCODONE-acetaminophen (PERCOCET) 7.5-325 MG tablet Take 1 tablet by mouth every 4 (four) hours as needed for severe pain. 10/30/19 10/29/20  Baglia, Corrina, PA-C  SENSIPAR 30 MG tablet Take 30 mg by mouth daily. 09/15/19   [provider]  VIAGRA 50 MG tablet Take 50 mg by mouth daily as needed for erectile dysfunction.  09/24/19   [provider]    Family History Family History  Problem Relation Age of Onset  . Hypertension Mother   . Cancer Mother        breast    Social History Social History   Tobacco Use  . Smoking status: Light Tobacco Smoker    Packs/day: 0.20    Types: Cigarettes  . Smokeless tobacco: Never Used  . Tobacco comment: couple puffs per day  Vaping Use  . Vaping Use: Never used  Substance Use Topics  . Alcohol use: No  . Drug use: No    Comment: Quit     Allergies   Patient has no known allergies.   Review of Systems Review of Systems  Constitutional: Negative for fatigue and fever.  Respiratory: Negative for cough and shortness of breath.   Cardiovascular: Negative for chest pain and palpitations.  Gastrointestinal: Negative for abdominal pain, diarrhea and vomiting.  Musculoskeletal: Positive for joint swelling. Negative for arthralgias and myalgias.  Skin: Negative for rash and wound.  Neurological: Negative for speech difficulty and headaches.  All other systems reviewed and are negative.    Physical Exam Triage Vital Signs ED Triage Vitals  Enc Vitals Group     BP 08/07/20 1002 133/87     Pulse Rate 08/07/20 1002 78     Resp 08/07/20 1002 18     Temp 08/07/20 1002 98.3 F (36.8 C)     Temp Source 08/07/20 1002 Oral     SpO2 08/07/20 1002 98 %     Weight --      Height --       Head Circumference --      Peak Flow --      Pain Score 08/07/20 1000 0     Pain Loc --      Pain Edu? --      Excl. in [place]? --    No data found.  Updated Vital Signs BP 133/87 (BP Location: Right Arm)   Pulse 78   Temp 98.3 F (36.8 C) (Oral)   Resp 18   SpO2 98%   Visual Acuity Right Eye Distance:   Left Eye Distance:   Bilateral Distance:    Right Eye Near:   Left Eye Near:    Bilateral Near:     Physical Exam Constitutional:      General:  He is not in acute distress. HENT:     Head: Normocephalic and atraumatic.  Eyes:     General: No scleral icterus.    Pupils: Pupils are equal, round, and reactive to light.  Cardiovascular:     Rate and Rhythm: Normal rate.  Pulmonary:     Effort: Pulmonary effort is normal. No respiratory distress.     Breath sounds: No wheezing.  Musculoskeletal:        General: Swelling present. No tenderness or deformity. Normal range of motion.     Comments: Right anterior shoulder with soft tissue swelling, ?  Subcutaneous mass that is not mobile, nor tender.  No bony tenderness  Skin:    General: Skin is warm.     Capillary Refill: Capillary refill takes less than 2 seconds.     Coloration: Skin is not jaundiced or pale.     Findings: No bruising or rash.  Neurological:     General: No focal deficit present.     Mental Status: He is alert and oriented to person, place, and time.      UC Treatments / Results  Labs (all labs ordered are listed, but only abnormal results are displayed) Labs Reviewed - No data to display  EKG   Radiology No results found.  Procedures Procedures (including critical care time)  Medications Ordered in UC Medications - No data to display  Initial Impression / Assessment and Plan / UC Course  I have reviewed the triage vital signs and the nursing notes.  Pertinent labs & imaging results that were available during my care of the patient were reviewed by me and considered in my medical  decision making (see chart for details).     H&P concerning for subcutaneous mass VS lipoma.  Less concern for calciphylaxis as lesion is nontender.  Will follow up with PCP, dialysis for possible ultrasound.  Return precautions discussed, pt verbalized understanding and is agreeable to plan. Final Clinical Impressions(s) / UC Diagnoses   Final diagnoses:  Swelling of joint of right shoulder     Discharge Instructions     Talk to dialysis RN and doc about shoulder as they may need to do blood work and/or ultrasound    ED Prescriptions    None     PDMP not reviewed this encounter.   Hall[place]Potvin, [place], [place] 08/07/20 1056

## 2020-08-07 NOTE — ED Triage Notes (Addendum)
Pt is here with a knot on his right shoulder that started 2 weeks ago, pt has not taken any meds to relieve discomfort but states it does not hurt.

## 2020-08-09 DIAGNOSIS — D509 Iron deficiency anemia, unspecified: Secondary | ICD-10-CM | POA: Diagnosis not present

## 2020-08-09 DIAGNOSIS — D631 Anemia in chronic kidney disease: Secondary | ICD-10-CM | POA: Diagnosis not present

## 2020-08-09 DIAGNOSIS — Z992 Dependence on renal dialysis: Secondary | ICD-10-CM | POA: Diagnosis not present

## 2020-08-09 DIAGNOSIS — N2581 Secondary hyperparathyroidism of renal origin: Secondary | ICD-10-CM | POA: Diagnosis not present

## 2020-08-09 DIAGNOSIS — E875 Hyperkalemia: Secondary | ICD-10-CM | POA: Diagnosis not present

## 2020-08-09 DIAGNOSIS — N186 End stage renal disease: Secondary | ICD-10-CM | POA: Diagnosis not present

## 2020-08-09 DIAGNOSIS — D689 Coagulation defect, unspecified: Secondary | ICD-10-CM | POA: Diagnosis not present

## 2020-08-13 DIAGNOSIS — Z992 Dependence on renal dialysis: Secondary | ICD-10-CM | POA: Diagnosis not present

## 2020-08-13 DIAGNOSIS — D509 Iron deficiency anemia, unspecified: Secondary | ICD-10-CM | POA: Diagnosis not present

## 2020-08-13 DIAGNOSIS — E875 Hyperkalemia: Secondary | ICD-10-CM | POA: Diagnosis not present

## 2020-08-13 DIAGNOSIS — D689 Coagulation defect, unspecified: Secondary | ICD-10-CM | POA: Diagnosis not present

## 2020-08-13 DIAGNOSIS — N2581 Secondary hyperparathyroidism of renal origin: Secondary | ICD-10-CM | POA: Diagnosis not present

## 2020-08-13 DIAGNOSIS — N186 End stage renal disease: Secondary | ICD-10-CM | POA: Diagnosis not present

## 2020-08-13 DIAGNOSIS — D631 Anemia in chronic kidney disease: Secondary | ICD-10-CM | POA: Diagnosis not present

## 2020-08-16 DIAGNOSIS — Z992 Dependence on renal dialysis: Secondary | ICD-10-CM | POA: Diagnosis not present

## 2020-08-16 DIAGNOSIS — E875 Hyperkalemia: Secondary | ICD-10-CM | POA: Diagnosis not present

## 2020-08-16 DIAGNOSIS — N186 End stage renal disease: Secondary | ICD-10-CM | POA: Diagnosis not present

## 2020-08-16 DIAGNOSIS — D509 Iron deficiency anemia, unspecified: Secondary | ICD-10-CM | POA: Diagnosis not present

## 2020-08-16 DIAGNOSIS — D689 Coagulation defect, unspecified: Secondary | ICD-10-CM | POA: Diagnosis not present

## 2020-08-16 DIAGNOSIS — N2581 Secondary hyperparathyroidism of renal origin: Secondary | ICD-10-CM | POA: Diagnosis not present

## 2020-08-16 DIAGNOSIS — D631 Anemia in chronic kidney disease: Secondary | ICD-10-CM | POA: Diagnosis not present

## 2020-08-18 DIAGNOSIS — E875 Hyperkalemia: Secondary | ICD-10-CM | POA: Diagnosis not present

## 2020-08-18 DIAGNOSIS — D689 Coagulation defect, unspecified: Secondary | ICD-10-CM | POA: Diagnosis not present

## 2020-08-18 DIAGNOSIS — D631 Anemia in chronic kidney disease: Secondary | ICD-10-CM | POA: Diagnosis not present

## 2020-08-18 DIAGNOSIS — N186 End stage renal disease: Secondary | ICD-10-CM | POA: Diagnosis not present

## 2020-08-18 DIAGNOSIS — N2581 Secondary hyperparathyroidism of renal origin: Secondary | ICD-10-CM | POA: Diagnosis not present

## 2020-08-18 DIAGNOSIS — Z992 Dependence on renal dialysis: Secondary | ICD-10-CM | POA: Diagnosis not present

## 2020-08-18 DIAGNOSIS — D509 Iron deficiency anemia, unspecified: Secondary | ICD-10-CM | POA: Diagnosis not present

## 2020-08-20 DIAGNOSIS — Z992 Dependence on renal dialysis: Secondary | ICD-10-CM | POA: Diagnosis not present

## 2020-08-20 DIAGNOSIS — D689 Coagulation defect, unspecified: Secondary | ICD-10-CM | POA: Diagnosis not present

## 2020-08-20 DIAGNOSIS — D631 Anemia in chronic kidney disease: Secondary | ICD-10-CM | POA: Diagnosis not present

## 2020-08-20 DIAGNOSIS — N186 End stage renal disease: Secondary | ICD-10-CM | POA: Diagnosis not present

## 2020-08-20 DIAGNOSIS — N2581 Secondary hyperparathyroidism of renal origin: Secondary | ICD-10-CM | POA: Diagnosis not present

## 2020-08-20 DIAGNOSIS — E875 Hyperkalemia: Secondary | ICD-10-CM | POA: Diagnosis not present

## 2020-08-20 DIAGNOSIS — D509 Iron deficiency anemia, unspecified: Secondary | ICD-10-CM | POA: Diagnosis not present

## 2020-08-25 DIAGNOSIS — E875 Hyperkalemia: Secondary | ICD-10-CM | POA: Diagnosis not present

## 2020-08-25 DIAGNOSIS — D689 Coagulation defect, unspecified: Secondary | ICD-10-CM | POA: Diagnosis not present

## 2020-08-25 DIAGNOSIS — N2581 Secondary hyperparathyroidism of renal origin: Secondary | ICD-10-CM | POA: Diagnosis not present

## 2020-08-25 DIAGNOSIS — N186 End stage renal disease: Secondary | ICD-10-CM | POA: Diagnosis not present

## 2020-08-25 DIAGNOSIS — D509 Iron deficiency anemia, unspecified: Secondary | ICD-10-CM | POA: Diagnosis not present

## 2020-08-25 DIAGNOSIS — D631 Anemia in chronic kidney disease: Secondary | ICD-10-CM | POA: Diagnosis not present

## 2020-08-25 DIAGNOSIS — Z992 Dependence on renal dialysis: Secondary | ICD-10-CM | POA: Diagnosis not present

## 2020-08-27 DIAGNOSIS — Z992 Dependence on renal dialysis: Secondary | ICD-10-CM | POA: Diagnosis not present

## 2020-08-27 DIAGNOSIS — N186 End stage renal disease: Secondary | ICD-10-CM | POA: Diagnosis not present

## 2020-08-27 DIAGNOSIS — D509 Iron deficiency anemia, unspecified: Secondary | ICD-10-CM | POA: Diagnosis not present

## 2020-08-27 DIAGNOSIS — D631 Anemia in chronic kidney disease: Secondary | ICD-10-CM | POA: Diagnosis not present

## 2020-08-27 DIAGNOSIS — N2581 Secondary hyperparathyroidism of renal origin: Secondary | ICD-10-CM | POA: Diagnosis not present

## 2020-08-27 DIAGNOSIS — E875 Hyperkalemia: Secondary | ICD-10-CM | POA: Diagnosis not present

## 2020-08-27 DIAGNOSIS — D689 Coagulation defect, unspecified: Secondary | ICD-10-CM | POA: Diagnosis not present

## 2020-08-30 DIAGNOSIS — D631 Anemia in chronic kidney disease: Secondary | ICD-10-CM | POA: Diagnosis not present

## 2020-08-30 DIAGNOSIS — D689 Coagulation defect, unspecified: Secondary | ICD-10-CM | POA: Diagnosis not present

## 2020-08-30 DIAGNOSIS — Z992 Dependence on renal dialysis: Secondary | ICD-10-CM | POA: Diagnosis not present

## 2020-08-30 DIAGNOSIS — N2581 Secondary hyperparathyroidism of renal origin: Secondary | ICD-10-CM | POA: Diagnosis not present

## 2020-08-30 DIAGNOSIS — D509 Iron deficiency anemia, unspecified: Secondary | ICD-10-CM | POA: Diagnosis not present

## 2020-08-30 DIAGNOSIS — N186 End stage renal disease: Secondary | ICD-10-CM | POA: Diagnosis not present

## 2020-08-30 DIAGNOSIS — E875 Hyperkalemia: Secondary | ICD-10-CM | POA: Diagnosis not present

## 2020-09-01 DIAGNOSIS — Z992 Dependence on renal dialysis: Secondary | ICD-10-CM | POA: Diagnosis not present

## 2020-09-01 DIAGNOSIS — N186 End stage renal disease: Secondary | ICD-10-CM | POA: Diagnosis not present

## 2020-09-01 DIAGNOSIS — D689 Coagulation defect, unspecified: Secondary | ICD-10-CM | POA: Diagnosis not present

## 2020-09-01 DIAGNOSIS — D509 Iron deficiency anemia, unspecified: Secondary | ICD-10-CM | POA: Diagnosis not present

## 2020-09-01 DIAGNOSIS — D631 Anemia in chronic kidney disease: Secondary | ICD-10-CM | POA: Diagnosis not present

## 2020-09-01 DIAGNOSIS — N2581 Secondary hyperparathyroidism of renal origin: Secondary | ICD-10-CM | POA: Diagnosis not present

## 2020-09-01 DIAGNOSIS — E875 Hyperkalemia: Secondary | ICD-10-CM | POA: Diagnosis not present

## 2020-09-06 DIAGNOSIS — N186 End stage renal disease: Secondary | ICD-10-CM | POA: Diagnosis not present

## 2020-09-06 DIAGNOSIS — I129 Hypertensive chronic kidney disease with stage 1 through stage 4 chronic kidney disease, or unspecified chronic kidney disease: Secondary | ICD-10-CM | POA: Diagnosis not present

## 2020-09-06 DIAGNOSIS — D689 Coagulation defect, unspecified: Secondary | ICD-10-CM | POA: Diagnosis not present

## 2020-09-06 DIAGNOSIS — D509 Iron deficiency anemia, unspecified: Secondary | ICD-10-CM | POA: Diagnosis not present

## 2020-09-06 DIAGNOSIS — Z992 Dependence on renal dialysis: Secondary | ICD-10-CM | POA: Diagnosis not present

## 2020-09-06 DIAGNOSIS — D631 Anemia in chronic kidney disease: Secondary | ICD-10-CM | POA: Diagnosis not present

## 2020-09-06 DIAGNOSIS — E875 Hyperkalemia: Secondary | ICD-10-CM | POA: Diagnosis not present

## 2020-09-06 DIAGNOSIS — N2581 Secondary hyperparathyroidism of renal origin: Secondary | ICD-10-CM | POA: Diagnosis not present

## 2020-09-08 DIAGNOSIS — D509 Iron deficiency anemia, unspecified: Secondary | ICD-10-CM | POA: Diagnosis not present

## 2020-09-08 DIAGNOSIS — D631 Anemia in chronic kidney disease: Secondary | ICD-10-CM | POA: Diagnosis not present

## 2020-09-08 DIAGNOSIS — R6883 Chills (without fever): Secondary | ICD-10-CM | POA: Diagnosis not present

## 2020-09-08 DIAGNOSIS — D689 Coagulation defect, unspecified: Secondary | ICD-10-CM | POA: Diagnosis not present

## 2020-09-08 DIAGNOSIS — Z992 Dependence on renal dialysis: Secondary | ICD-10-CM | POA: Diagnosis not present

## 2020-09-08 DIAGNOSIS — R6520 Severe sepsis without septic shock: Secondary | ICD-10-CM | POA: Diagnosis not present

## 2020-09-08 DIAGNOSIS — N2581 Secondary hyperparathyroidism of renal origin: Secondary | ICD-10-CM | POA: Diagnosis not present

## 2020-09-08 DIAGNOSIS — E875 Hyperkalemia: Secondary | ICD-10-CM | POA: Diagnosis not present

## 2020-09-08 DIAGNOSIS — N186 End stage renal disease: Secondary | ICD-10-CM | POA: Diagnosis not present

## 2020-09-10 DIAGNOSIS — D509 Iron deficiency anemia, unspecified: Secondary | ICD-10-CM | POA: Diagnosis not present

## 2020-09-10 DIAGNOSIS — Z992 Dependence on renal dialysis: Secondary | ICD-10-CM | POA: Diagnosis not present

## 2020-09-10 DIAGNOSIS — N186 End stage renal disease: Secondary | ICD-10-CM | POA: Diagnosis not present

## 2020-09-10 DIAGNOSIS — R6883 Chills (without fever): Secondary | ICD-10-CM | POA: Diagnosis not present

## 2020-09-10 DIAGNOSIS — D689 Coagulation defect, unspecified: Secondary | ICD-10-CM | POA: Diagnosis not present

## 2020-09-10 DIAGNOSIS — N2581 Secondary hyperparathyroidism of renal origin: Secondary | ICD-10-CM | POA: Diagnosis not present

## 2020-09-10 DIAGNOSIS — D631 Anemia in chronic kidney disease: Secondary | ICD-10-CM | POA: Diagnosis not present

## 2020-09-10 DIAGNOSIS — R6520 Severe sepsis without septic shock: Secondary | ICD-10-CM | POA: Diagnosis not present

## 2020-09-10 DIAGNOSIS — E875 Hyperkalemia: Secondary | ICD-10-CM | POA: Diagnosis not present

## 2020-09-13 DIAGNOSIS — N2581 Secondary hyperparathyroidism of renal origin: Secondary | ICD-10-CM | POA: Diagnosis not present

## 2020-09-13 DIAGNOSIS — N186 End stage renal disease: Secondary | ICD-10-CM | POA: Diagnosis not present

## 2020-09-13 DIAGNOSIS — Z992 Dependence on renal dialysis: Secondary | ICD-10-CM | POA: Diagnosis not present

## 2020-09-13 DIAGNOSIS — E875 Hyperkalemia: Secondary | ICD-10-CM | POA: Diagnosis not present

## 2020-09-13 DIAGNOSIS — R6520 Severe sepsis without septic shock: Secondary | ICD-10-CM | POA: Diagnosis not present

## 2020-09-13 DIAGNOSIS — D631 Anemia in chronic kidney disease: Secondary | ICD-10-CM | POA: Diagnosis not present

## 2020-09-13 DIAGNOSIS — R6883 Chills (without fever): Secondary | ICD-10-CM | POA: Diagnosis not present

## 2020-09-13 DIAGNOSIS — D689 Coagulation defect, unspecified: Secondary | ICD-10-CM | POA: Diagnosis not present

## 2020-09-13 DIAGNOSIS — D509 Iron deficiency anemia, unspecified: Secondary | ICD-10-CM | POA: Diagnosis not present

## 2020-09-15 DIAGNOSIS — R6883 Chills (without fever): Secondary | ICD-10-CM | POA: Diagnosis not present

## 2020-09-15 DIAGNOSIS — D631 Anemia in chronic kidney disease: Secondary | ICD-10-CM | POA: Diagnosis not present

## 2020-09-15 DIAGNOSIS — R6520 Severe sepsis without septic shock: Secondary | ICD-10-CM | POA: Diagnosis not present

## 2020-09-15 DIAGNOSIS — D509 Iron deficiency anemia, unspecified: Secondary | ICD-10-CM | POA: Diagnosis not present

## 2020-09-15 DIAGNOSIS — Z992 Dependence on renal dialysis: Secondary | ICD-10-CM | POA: Diagnosis not present

## 2020-09-15 DIAGNOSIS — N2581 Secondary hyperparathyroidism of renal origin: Secondary | ICD-10-CM | POA: Diagnosis not present

## 2020-09-15 DIAGNOSIS — N186 End stage renal disease: Secondary | ICD-10-CM | POA: Diagnosis not present

## 2020-09-15 DIAGNOSIS — E875 Hyperkalemia: Secondary | ICD-10-CM | POA: Diagnosis not present

## 2020-09-15 DIAGNOSIS — D689 Coagulation defect, unspecified: Secondary | ICD-10-CM | POA: Diagnosis not present

## 2020-09-17 ENCOUNTER — Ambulatory Visit
Admission: RE | Admit: 2020-09-17 | Discharge: 2020-09-17 | Disposition: A | Discharge status: Home / Self Care | Payer: Medicaid Other | Source: Ambulatory Visit | Attending: Nephrology | Admitting: Nephrology

## 2020-09-17 ENCOUNTER — Other Ambulatory Visit: Payer: Self-pay | Admitting: Nephrology

## 2020-09-17 ENCOUNTER — Other Ambulatory Visit: Payer: Self-pay

## 2020-09-17 DIAGNOSIS — D509 Iron deficiency anemia, unspecified: Secondary | ICD-10-CM | POA: Diagnosis not present

## 2020-09-17 DIAGNOSIS — E875 Hyperkalemia: Secondary | ICD-10-CM | POA: Diagnosis not present

## 2020-09-17 DIAGNOSIS — N2581 Secondary hyperparathyroidism of renal origin: Secondary | ICD-10-CM | POA: Diagnosis not present

## 2020-09-17 DIAGNOSIS — D631 Anemia in chronic kidney disease: Secondary | ICD-10-CM | POA: Diagnosis not present

## 2020-09-17 DIAGNOSIS — R531 Weakness: Secondary | ICD-10-CM | POA: Diagnosis not present

## 2020-09-17 DIAGNOSIS — Z992 Dependence on renal dialysis: Secondary | ICD-10-CM | POA: Diagnosis not present

## 2020-09-17 DIAGNOSIS — M7989 Other specified soft tissue disorders: Secondary | ICD-10-CM

## 2020-09-17 DIAGNOSIS — R6883 Chills (without fever): Secondary | ICD-10-CM | POA: Diagnosis not present

## 2020-09-17 DIAGNOSIS — D689 Coagulation defect, unspecified: Secondary | ICD-10-CM | POA: Diagnosis not present

## 2020-09-17 DIAGNOSIS — N186 End stage renal disease: Secondary | ICD-10-CM | POA: Diagnosis not present

## 2020-09-17 DIAGNOSIS — R6520 Severe sepsis without septic shock: Secondary | ICD-10-CM | POA: Diagnosis not present

## 2020-09-17 IMAGING — DX DG SHOULDER 2+V*R*
3 series · 3 of 3 positions shown · non-contrast
Comparison: None.

CLINICAL DATA: RT ARM SWELLING WEAKNESS IN RT SHOULDER

EXAM:
RIGHT SHOULDER - 2+ VIEW

[dg shoulder right (1 of 3)]
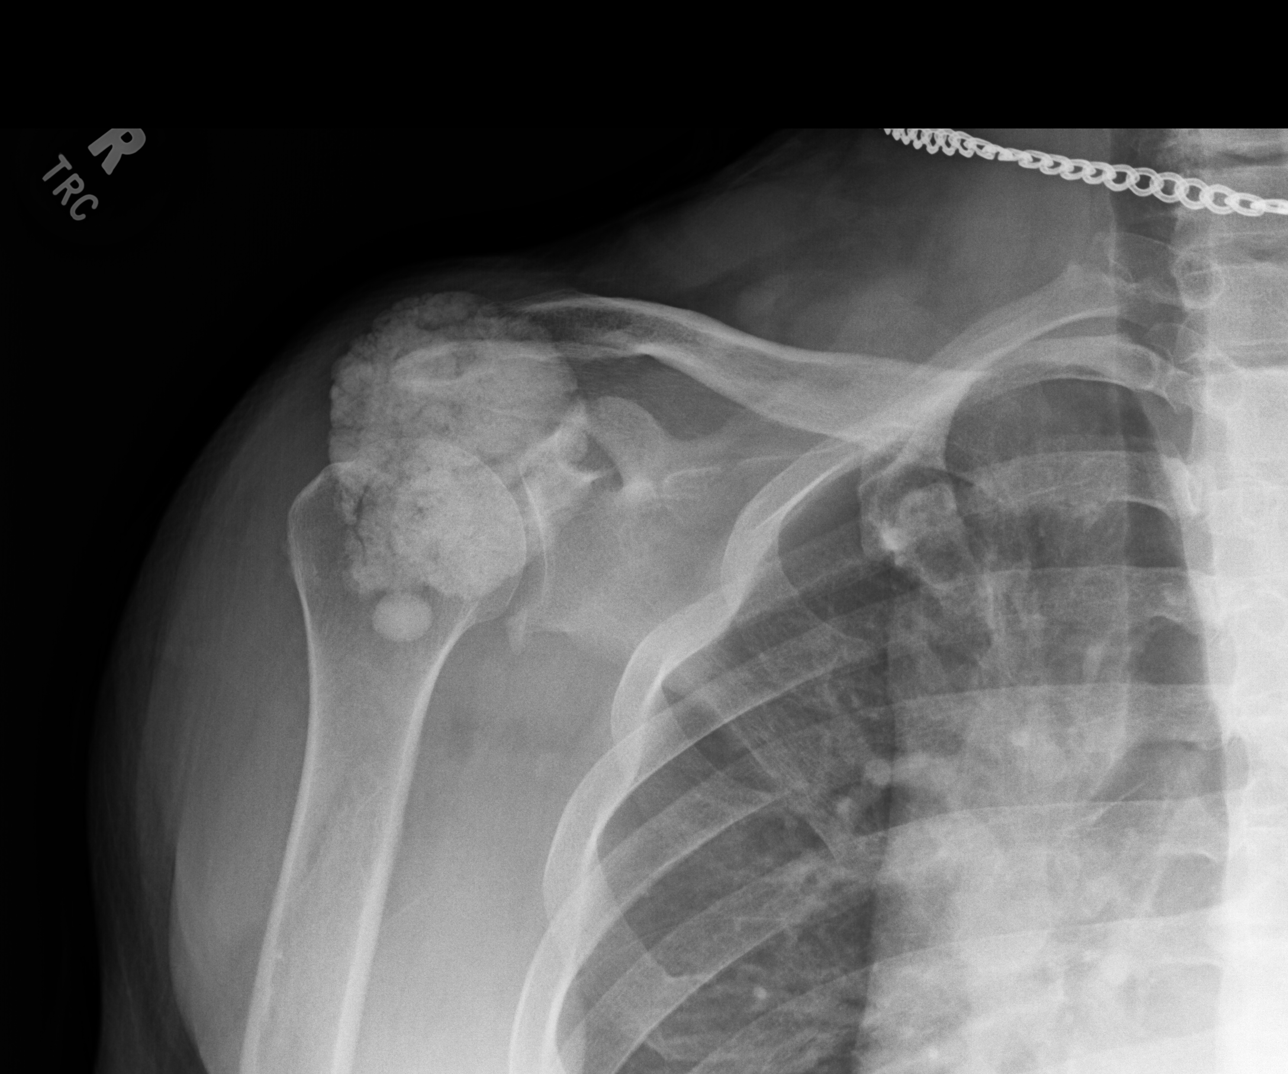

[dg shoulder right (2 of 3)]
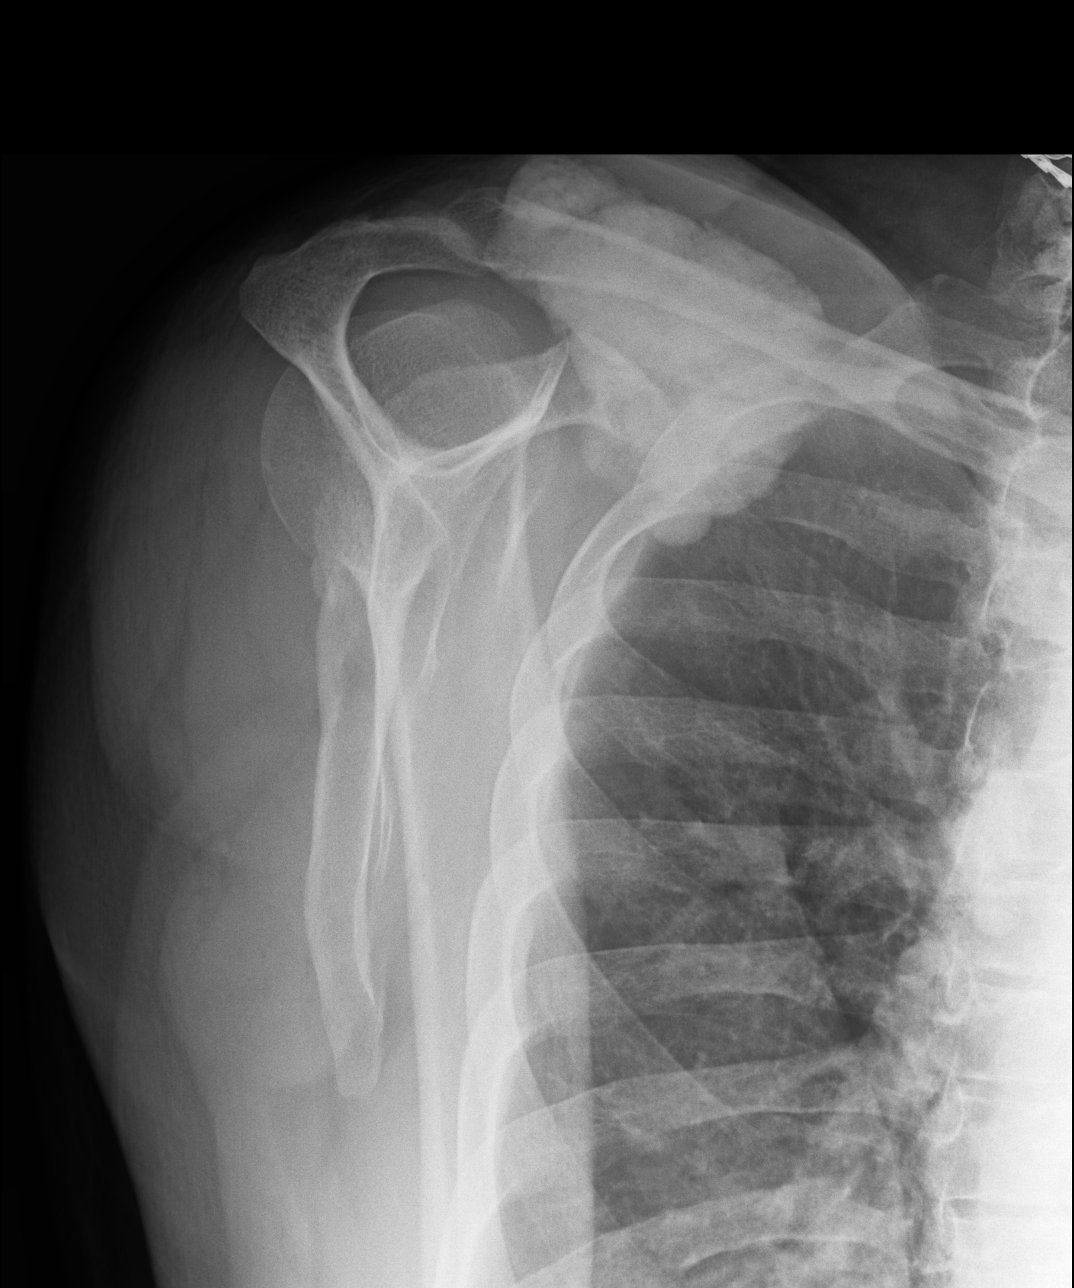

[dg shoulder right (3 of 3)]
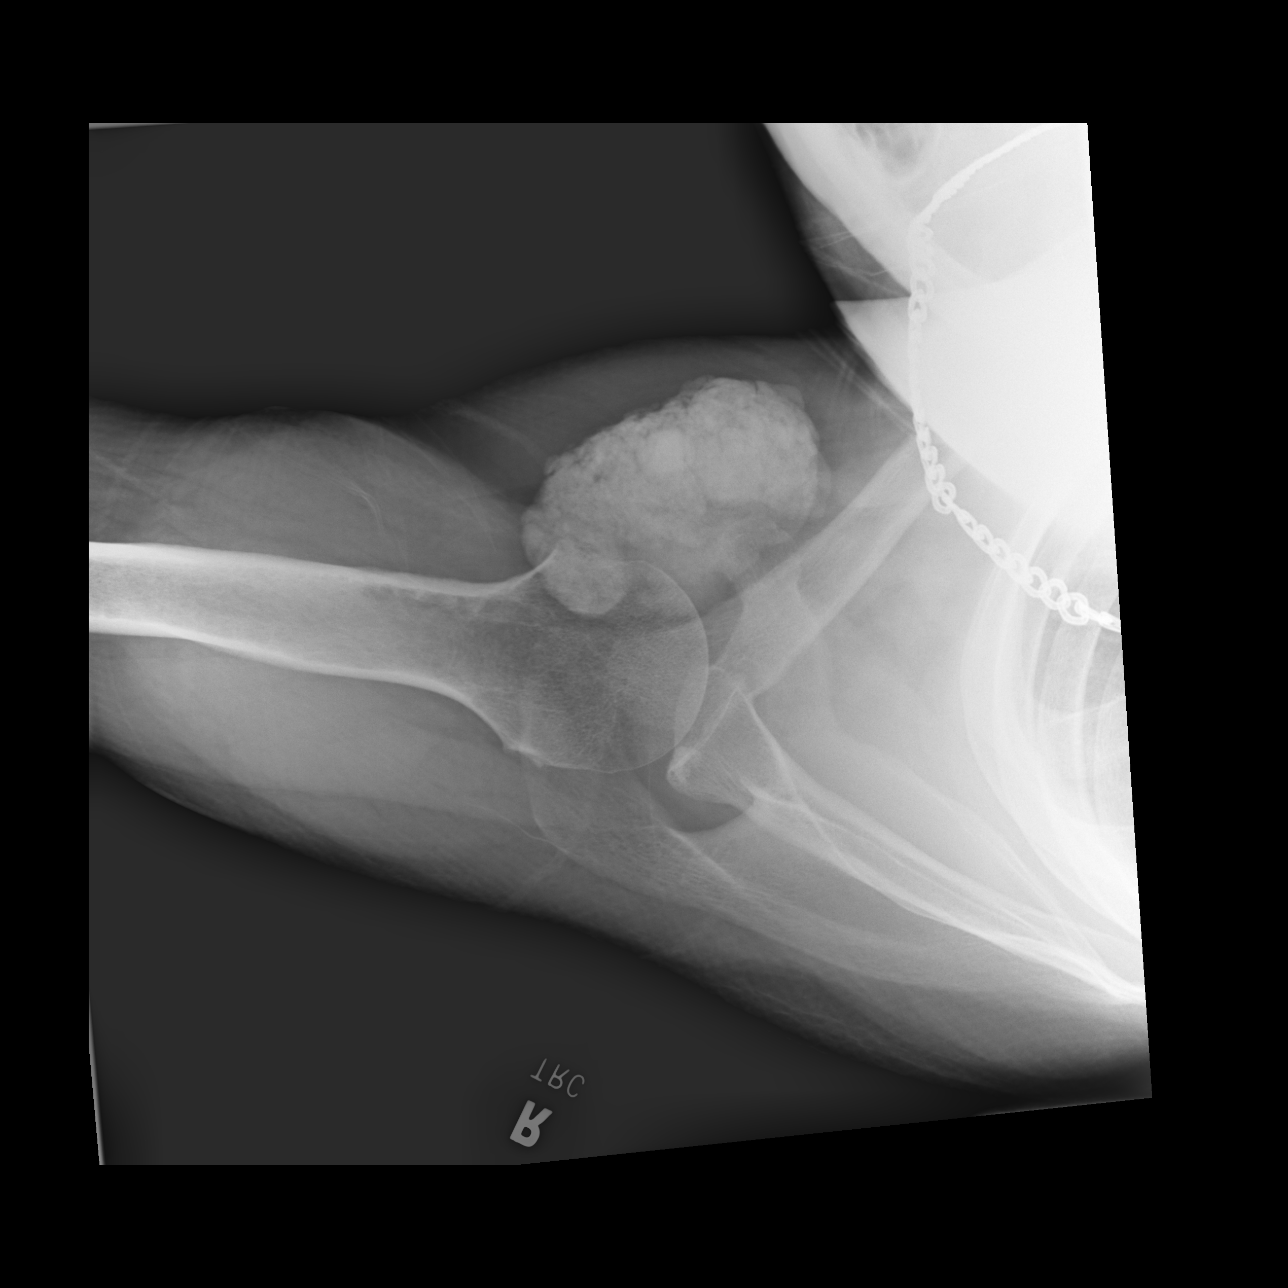

[3 of 3 positions shown; findings below may reference images not displayed]

FINDINGS: Normal shoulder alignment with approximation of the joints. No
fracture. No evidence of arthropathy.

Calcified periarticular soft tissue mass measuring 9.0 x 6.4 cm.
Visualized lung field is clear.
IMPRESSION: 9.0 cm calcified soft tissue mass may reflect calcinosis of chronic
renal failure.

## 2020-09-20 DIAGNOSIS — D689 Coagulation defect, unspecified: Secondary | ICD-10-CM | POA: Diagnosis not present

## 2020-09-20 DIAGNOSIS — Z992 Dependence on renal dialysis: Secondary | ICD-10-CM | POA: Diagnosis not present

## 2020-09-20 DIAGNOSIS — D631 Anemia in chronic kidney disease: Secondary | ICD-10-CM | POA: Diagnosis not present

## 2020-09-20 DIAGNOSIS — D509 Iron deficiency anemia, unspecified: Secondary | ICD-10-CM | POA: Diagnosis not present

## 2020-09-20 DIAGNOSIS — E875 Hyperkalemia: Secondary | ICD-10-CM | POA: Diagnosis not present

## 2020-09-20 DIAGNOSIS — R6520 Severe sepsis without septic shock: Secondary | ICD-10-CM | POA: Diagnosis not present

## 2020-09-20 DIAGNOSIS — N186 End stage renal disease: Secondary | ICD-10-CM | POA: Diagnosis not present

## 2020-09-20 DIAGNOSIS — R6883 Chills (without fever): Secondary | ICD-10-CM | POA: Diagnosis not present

## 2020-09-20 DIAGNOSIS — N2581 Secondary hyperparathyroidism of renal origin: Secondary | ICD-10-CM | POA: Diagnosis not present

## 2020-09-22 DIAGNOSIS — E875 Hyperkalemia: Secondary | ICD-10-CM | POA: Diagnosis not present

## 2020-09-22 DIAGNOSIS — D689 Coagulation defect, unspecified: Secondary | ICD-10-CM | POA: Diagnosis not present

## 2020-09-22 DIAGNOSIS — R6883 Chills (without fever): Secondary | ICD-10-CM | POA: Diagnosis not present

## 2020-09-22 DIAGNOSIS — Z992 Dependence on renal dialysis: Secondary | ICD-10-CM | POA: Diagnosis not present

## 2020-09-22 DIAGNOSIS — N186 End stage renal disease: Secondary | ICD-10-CM | POA: Diagnosis not present

## 2020-09-22 DIAGNOSIS — D509 Iron deficiency anemia, unspecified: Secondary | ICD-10-CM | POA: Diagnosis not present

## 2020-09-22 DIAGNOSIS — D631 Anemia in chronic kidney disease: Secondary | ICD-10-CM | POA: Diagnosis not present

## 2020-09-22 DIAGNOSIS — R6520 Severe sepsis without septic shock: Secondary | ICD-10-CM | POA: Diagnosis not present

## 2020-09-22 DIAGNOSIS — N2581 Secondary hyperparathyroidism of renal origin: Secondary | ICD-10-CM | POA: Diagnosis not present

## 2020-09-27 ENCOUNTER — Emergency Department (HOSPITAL_COMMUNITY): Payer: Medicaid Other

## 2020-09-27 ENCOUNTER — Inpatient Hospital Stay (HOSPITAL_COMMUNITY)
Admission: EM | Admit: 2020-09-27 | Discharge: 2020-11-10 | DRG: 853 | Disposition: A | Discharge status: Skilled Nursing Facility | Payer: Medicaid Other | Attending: Family Medicine | Admitting: Family Medicine

## 2020-09-27 DIAGNOSIS — Z9889 Other specified postprocedural states: Secondary | ICD-10-CM | POA: Diagnosis not present

## 2020-09-27 DIAGNOSIS — R768 Other specified abnormal immunological findings in serum: Secondary | ICD-10-CM | POA: Diagnosis not present

## 2020-09-27 DIAGNOSIS — I6389 Other cerebral infarction: Secondary | ICD-10-CM | POA: Diagnosis not present

## 2020-09-27 DIAGNOSIS — I63139 Cerebral infarction due to embolism of unspecified carotid artery: Secondary | ICD-10-CM | POA: Diagnosis not present

## 2020-09-27 DIAGNOSIS — R7881 Bacteremia: Secondary | ICD-10-CM | POA: Diagnosis not present

## 2020-09-27 DIAGNOSIS — I5042 Chronic combined systolic (congestive) and diastolic (congestive) heart failure: Secondary | ICD-10-CM | POA: Diagnosis present

## 2020-09-27 DIAGNOSIS — B182 Chronic viral hepatitis C: Secondary | ICD-10-CM | POA: Diagnosis not present

## 2020-09-27 DIAGNOSIS — I6340 Cerebral infarction due to embolism of unspecified cerebral artery: Secondary | ICD-10-CM | POA: Diagnosis not present

## 2020-09-27 DIAGNOSIS — Z20828 Contact with and (suspected) exposure to other viral communicable diseases: Secondary | ICD-10-CM | POA: Diagnosis not present

## 2020-09-27 DIAGNOSIS — Q612 Polycystic kidney, adult type: Secondary | ICD-10-CM | POA: Diagnosis not present

## 2020-09-27 DIAGNOSIS — Z515 Encounter for palliative care: Secondary | ICD-10-CM | POA: Diagnosis not present

## 2020-09-27 DIAGNOSIS — R5081 Fever presenting with conditions classified elsewhere: Secondary | ICD-10-CM | POA: Diagnosis not present

## 2020-09-27 DIAGNOSIS — A419 Sepsis, unspecified organism: Secondary | ICD-10-CM | POA: Diagnosis present

## 2020-09-27 DIAGNOSIS — R531 Weakness: Secondary | ICD-10-CM | POA: Diagnosis not present

## 2020-09-27 DIAGNOSIS — I2690 Septic pulmonary embolism without acute cor pulmonale: Secondary | ICD-10-CM | POA: Diagnosis present

## 2020-09-27 DIAGNOSIS — I330 Acute and subacute infective endocarditis: Secondary | ICD-10-CM | POA: Diagnosis not present

## 2020-09-27 DIAGNOSIS — N186 End stage renal disease: Secondary | ICD-10-CM | POA: Diagnosis not present

## 2020-09-27 DIAGNOSIS — I10 Essential (primary) hypertension: Secondary | ICD-10-CM | POA: Diagnosis not present

## 2020-09-27 DIAGNOSIS — I150 Renovascular hypertension: Secondary | ICD-10-CM | POA: Diagnosis not present

## 2020-09-27 DIAGNOSIS — I459 Conduction disorder, unspecified: Secondary | ICD-10-CM | POA: Diagnosis present

## 2020-09-27 DIAGNOSIS — R000 Tachycardia, unspecified: Secondary | ICD-10-CM | POA: Diagnosis not present

## 2020-09-27 DIAGNOSIS — Z953 Presence of xenogenic heart valve: Secondary | ICD-10-CM

## 2020-09-27 DIAGNOSIS — A4102 Sepsis due to Methicillin resistant Staphylococcus aureus: Principal | ICD-10-CM | POA: Diagnosis present

## 2020-09-27 DIAGNOSIS — Z952 Presence of prosthetic heart valve: Secondary | ICD-10-CM | POA: Diagnosis not present

## 2020-09-27 DIAGNOSIS — M86461 Chronic osteomyelitis with draining sinus, right tibia and fibula: Secondary | ICD-10-CM | POA: Diagnosis not present

## 2020-09-27 DIAGNOSIS — Z20822 Contact with and (suspected) exposure to covid-19: Secondary | ICD-10-CM | POA: Diagnosis not present

## 2020-09-27 DIAGNOSIS — R278 Other lack of coordination: Secondary | ICD-10-CM | POA: Diagnosis present

## 2020-09-27 DIAGNOSIS — E872 Acidosis, unspecified: Secondary | ICD-10-CM | POA: Diagnosis present

## 2020-09-27 DIAGNOSIS — B9561 Methicillin susceptible Staphylococcus aureus infection as the cause of diseases classified elsewhere: Secondary | ICD-10-CM | POA: Diagnosis present

## 2020-09-27 DIAGNOSIS — I76 Septic arterial embolism: Secondary | ICD-10-CM | POA: Diagnosis not present

## 2020-09-27 DIAGNOSIS — Z8616 Personal history of COVID-19: Secondary | ICD-10-CM | POA: Diagnosis not present

## 2020-09-27 DIAGNOSIS — B1920 Unspecified viral hepatitis C without hepatic coma: Secondary | ICD-10-CM | POA: Diagnosis present

## 2020-09-27 DIAGNOSIS — I509 Heart failure, unspecified: Secondary | ICD-10-CM

## 2020-09-27 DIAGNOSIS — E875 Hyperkalemia: Secondary | ICD-10-CM | POA: Diagnosis not present

## 2020-09-27 DIAGNOSIS — M86462 Chronic osteomyelitis with draining sinus, left tibia and fibula: Secondary | ICD-10-CM | POA: Diagnosis not present

## 2020-09-27 DIAGNOSIS — D689 Coagulation defect, unspecified: Secondary | ICD-10-CM | POA: Diagnosis not present

## 2020-09-27 DIAGNOSIS — I639 Cerebral infarction, unspecified: Secondary | ICD-10-CM | POA: Diagnosis not present

## 2020-09-27 DIAGNOSIS — J189 Pneumonia, unspecified organism: Secondary | ICD-10-CM | POA: Diagnosis present

## 2020-09-27 DIAGNOSIS — R29716 NIHSS score 16: Secondary | ICD-10-CM | POA: Diagnosis not present

## 2020-09-27 DIAGNOSIS — Z8249 Family history of ischemic heart disease and other diseases of the circulatory system: Secondary | ICD-10-CM

## 2020-09-27 DIAGNOSIS — Z89611 Acquired absence of right leg above knee: Secondary | ICD-10-CM

## 2020-09-27 DIAGNOSIS — I340 Nonrheumatic mitral (valve) insufficiency: Secondary | ICD-10-CM | POA: Diagnosis present

## 2020-09-27 DIAGNOSIS — D6959 Other secondary thrombocytopenia: Secondary | ICD-10-CM | POA: Diagnosis present

## 2020-09-27 DIAGNOSIS — N200 Calculus of kidney: Secondary | ICD-10-CM | POA: Diagnosis not present

## 2020-09-27 DIAGNOSIS — R6520 Severe sepsis without septic shock: Secondary | ICD-10-CM | POA: Diagnosis present

## 2020-09-27 DIAGNOSIS — R4182 Altered mental status, unspecified: Secondary | ICD-10-CM | POA: Diagnosis not present

## 2020-09-27 DIAGNOSIS — I700 Atherosclerosis of aorta: Secondary | ICD-10-CM | POA: Diagnosis present

## 2020-09-27 DIAGNOSIS — I350 Nonrheumatic aortic (valve) stenosis: Secondary | ICD-10-CM | POA: Diagnosis not present

## 2020-09-27 DIAGNOSIS — G253 Myoclonus: Secondary | ICD-10-CM | POA: Diagnosis not present

## 2020-09-27 DIAGNOSIS — K7460 Unspecified cirrhosis of liver: Secondary | ICD-10-CM | POA: Diagnosis present

## 2020-09-27 DIAGNOSIS — M545 Low back pain, unspecified: Secondary | ICD-10-CM | POA: Diagnosis not present

## 2020-09-27 DIAGNOSIS — D509 Iron deficiency anemia, unspecified: Secondary | ICD-10-CM | POA: Diagnosis not present

## 2020-09-27 DIAGNOSIS — R0689 Other abnormalities of breathing: Secondary | ICD-10-CM | POA: Diagnosis not present

## 2020-09-27 DIAGNOSIS — K045 Chronic apical periodontitis: Secondary | ICD-10-CM | POA: Diagnosis not present

## 2020-09-27 DIAGNOSIS — R4701 Aphasia: Secondary | ICD-10-CM | POA: Diagnosis not present

## 2020-09-27 DIAGNOSIS — G4733 Obstructive sleep apnea (adult) (pediatric): Secondary | ICD-10-CM | POA: Diagnosis present

## 2020-09-27 DIAGNOSIS — I339 Acute and subacute endocarditis, unspecified: Secondary | ICD-10-CM | POA: Diagnosis not present

## 2020-09-27 DIAGNOSIS — Z992 Dependence on renal dialysis: Secondary | ICD-10-CM | POA: Diagnosis not present

## 2020-09-27 DIAGNOSIS — S78111A Complete traumatic amputation at level between right hip and knee, initial encounter: Secondary | ICD-10-CM | POA: Diagnosis not present

## 2020-09-27 DIAGNOSIS — K0530 Chronic periodontitis, unspecified: Secondary | ICD-10-CM | POA: Diagnosis present

## 2020-09-27 DIAGNOSIS — R0902 Hypoxemia: Secondary | ICD-10-CM | POA: Diagnosis present

## 2020-09-27 DIAGNOSIS — N189 Chronic kidney disease, unspecified: Secondary | ICD-10-CM | POA: Diagnosis present

## 2020-09-27 DIAGNOSIS — R6883 Chills (without fever): Secondary | ICD-10-CM | POA: Insufficient documentation

## 2020-09-27 DIAGNOSIS — R0602 Shortness of breath: Secondary | ICD-10-CM

## 2020-09-27 DIAGNOSIS — Q211 Atrial septal defect: Secondary | ICD-10-CM

## 2020-09-27 DIAGNOSIS — Z79899 Other long term (current) drug therapy: Secondary | ICD-10-CM

## 2020-09-27 DIAGNOSIS — Z5320 Procedure and treatment not carried out because of patient's decision for unspecified reasons: Secondary | ICD-10-CM | POA: Diagnosis not present

## 2020-09-27 DIAGNOSIS — N281 Cyst of kidney, acquired: Secondary | ICD-10-CM | POA: Diagnosis not present

## 2020-09-27 DIAGNOSIS — J984 Other disorders of lung: Secondary | ICD-10-CM

## 2020-09-27 DIAGNOSIS — B9562 Methicillin resistant Staphylococcus aureus infection as the cause of diseases classified elsewhere: Secondary | ICD-10-CM | POA: Diagnosis not present

## 2020-09-27 DIAGNOSIS — R079 Chest pain, unspecified: Secondary | ICD-10-CM | POA: Diagnosis not present

## 2020-09-27 DIAGNOSIS — I63119 Cerebral infarction due to embolism of unspecified vertebral artery: Secondary | ICD-10-CM | POA: Diagnosis not present

## 2020-09-27 DIAGNOSIS — N2581 Secondary hyperparathyroidism of renal origin: Secondary | ICD-10-CM | POA: Diagnosis present

## 2020-09-27 DIAGNOSIS — L0291 Cutaneous abscess, unspecified: Secondary | ICD-10-CM

## 2020-09-27 DIAGNOSIS — I132 Hypertensive heart and chronic kidney disease with heart failure and with stage 5 chronic kidney disease, or end stage renal disease: Secondary | ICD-10-CM | POA: Diagnosis present

## 2020-09-27 DIAGNOSIS — R29712 NIHSS score 12: Secondary | ICD-10-CM | POA: Diagnosis not present

## 2020-09-27 DIAGNOSIS — K083 Retained dental root: Secondary | ICD-10-CM | POA: Diagnosis present

## 2020-09-27 DIAGNOSIS — R29711 NIHSS score 11: Secondary | ICD-10-CM | POA: Diagnosis not present

## 2020-09-27 DIAGNOSIS — M792 Neuralgia and neuritis, unspecified: Secondary | ICD-10-CM | POA: Diagnosis not present

## 2020-09-27 DIAGNOSIS — Z01810 Encounter for preprocedural cardiovascular examination: Secondary | ICD-10-CM | POA: Diagnosis not present

## 2020-09-27 DIAGNOSIS — R040 Epistaxis: Secondary | ICD-10-CM | POA: Diagnosis not present

## 2020-09-27 DIAGNOSIS — L02415 Cutaneous abscess of right lower limb: Secondary | ICD-10-CM | POA: Diagnosis not present

## 2020-09-27 DIAGNOSIS — J9811 Atelectasis: Secondary | ICD-10-CM

## 2020-09-27 DIAGNOSIS — I341 Nonrheumatic mitral (valve) prolapse: Secondary | ICD-10-CM | POA: Diagnosis not present

## 2020-09-27 DIAGNOSIS — K089 Disorder of teeth and supporting structures, unspecified: Secondary | ICD-10-CM

## 2020-09-27 DIAGNOSIS — E785 Hyperlipidemia, unspecified: Secondary | ICD-10-CM | POA: Diagnosis present

## 2020-09-27 DIAGNOSIS — M86161 Other acute osteomyelitis, right tibia and fibula: Secondary | ICD-10-CM | POA: Diagnosis present

## 2020-09-27 DIAGNOSIS — R509 Fever, unspecified: Secondary | ICD-10-CM

## 2020-09-27 DIAGNOSIS — K036 Deposits [accretions] on teeth: Secondary | ICD-10-CM | POA: Diagnosis not present

## 2020-09-27 DIAGNOSIS — Z7189 Other specified counseling: Secondary | ICD-10-CM | POA: Diagnosis not present

## 2020-09-27 DIAGNOSIS — Z9119 Patient's noncompliance with other medical treatment and regimen: Secondary | ICD-10-CM

## 2020-09-27 DIAGNOSIS — G928 Other toxic encephalopathy: Secondary | ICD-10-CM | POA: Diagnosis present

## 2020-09-27 DIAGNOSIS — E871 Hypo-osmolality and hyponatremia: Secondary | ICD-10-CM | POA: Diagnosis not present

## 2020-09-27 DIAGNOSIS — Z9114 Patient's other noncompliance with medication regimen: Secondary | ICD-10-CM

## 2020-09-27 DIAGNOSIS — I5032 Chronic diastolic (congestive) heart failure: Secondary | ICD-10-CM | POA: Diagnosis not present

## 2020-09-27 DIAGNOSIS — I6310 Cerebral infarction due to embolism of unspecified precerebral artery: Secondary | ICD-10-CM | POA: Diagnosis not present

## 2020-09-27 DIAGNOSIS — K029 Dental caries, unspecified: Secondary | ICD-10-CM | POA: Diagnosis not present

## 2020-09-27 DIAGNOSIS — I2510 Atherosclerotic heart disease of native coronary artery without angina pectoris: Secondary | ICD-10-CM | POA: Diagnosis present

## 2020-09-27 DIAGNOSIS — D631 Anemia in chronic kidney disease: Secondary | ICD-10-CM | POA: Diagnosis not present

## 2020-09-27 DIAGNOSIS — R29718 NIHSS score 18: Secondary | ICD-10-CM | POA: Diagnosis not present

## 2020-09-27 DIAGNOSIS — F17210 Nicotine dependence, cigarettes, uncomplicated: Secondary | ICD-10-CM | POA: Diagnosis present

## 2020-09-27 DIAGNOSIS — R791 Abnormal coagulation profile: Secondary | ICD-10-CM | POA: Diagnosis not present

## 2020-09-27 DIAGNOSIS — D62 Acute posthemorrhagic anemia: Secondary | ICD-10-CM | POA: Diagnosis not present

## 2020-09-27 DIAGNOSIS — Q2112 Patent foramen ovale: Secondary | ICD-10-CM

## 2020-09-27 HISTORY — DX: Acute and subacute infective endocarditis: I33.0

## 2020-09-27 HISTORY — DX: Nonrheumatic mitral (valve) insufficiency: I34.0

## 2020-09-27 LAB — APTT: aPTT: 36 seconds (ref 24–36)

## 2020-09-27 LAB — CBC
HCT: 32.6 % — ABNORMAL LOW (ref 39.0–52.0)
Hemoglobin: 11 g/dL — ABNORMAL LOW (ref 13.0–17.0)
MCH: 27.8 pg (ref 26.0–34.0)
MCHC: 33.7 g/dL (ref 30.0–36.0)
MCV: 82.3 fL (ref 80.0–100.0)
Platelets: 83 10*3/uL — ABNORMAL LOW (ref 150–400)
RBC: 3.96 MIL/uL — ABNORMAL LOW (ref 4.22–5.81)
RDW: 15 % (ref 11.5–15.5)
WBC: 23.2 10*3/uL — ABNORMAL HIGH (ref 4.0–10.5)
nRBC: 0 % (ref 0.0–0.2)

## 2020-09-27 LAB — COMPREHENSIVE METABOLIC PANEL
ALT: 15 U/L (ref 0–44)
AST: 42 U/L — ABNORMAL HIGH (ref 15–41)
Albumin: 2.9 g/dL — ABNORMAL LOW (ref 3.5–5.0)
Alkaline Phosphatase: 107 U/L (ref 38–126)
Anion gap: 22 — ABNORMAL HIGH (ref 5–15)
BUN: 48 mg/dL — ABNORMAL HIGH (ref 6–20)
CO2: 23 mmol/L (ref 22–32)
Calcium: 8.6 mg/dL — ABNORMAL LOW (ref 8.9–10.3)
Chloride: 92 mmol/L — ABNORMAL LOW (ref 98–111)
Creatinine, Ser: 10.3 mg/dL — ABNORMAL HIGH (ref 0.61–1.24)
GFR, Estimated: 6 mL/min — ABNORMAL LOW (ref >60)
Glucose, Bld: 93 mg/dL (ref 70–99)
Potassium: 4.6 mmol/L (ref 3.5–5.1)
Sodium: 137 mmol/L (ref 135–145)
Total Bilirubin: 2.1 mg/dL — ABNORMAL HIGH (ref 0.3–1.2)
Total Protein: 8.4 g/dL — ABNORMAL HIGH (ref 6.5–8.1)

## 2020-09-27 LAB — BASIC METABOLIC PANEL
Anion gap: 20 — ABNORMAL HIGH (ref 5–15)
BUN: 45 mg/dL — ABNORMAL HIGH (ref 6–20)
CO2: 25 mmol/L (ref 22–32)
Calcium: 8.8 mg/dL — ABNORMAL LOW (ref 8.9–10.3)
Chloride: 92 mmol/L — ABNORMAL LOW (ref 98–111)
Creatinine, Ser: 9.82 mg/dL — ABNORMAL HIGH (ref 0.61–1.24)
GFR, Estimated: 6 mL/min — ABNORMAL LOW (ref >60)
Glucose, Bld: 86 mg/dL (ref 70–99)
Potassium: 4.4 mmol/L (ref 3.5–5.1)
Sodium: 137 mmol/L (ref 135–145)

## 2020-09-27 LAB — RESP PANEL BY RT-PCR (FLU A&B, COVID) ARPGX2
Influenza A by PCR: NEGATIVE
Influenza B by PCR: NEGATIVE
SARS Coronavirus 2 by RT PCR: NEGATIVE

## 2020-09-27 LAB — PROTIME-INR
INR: 1.4 — ABNORMAL HIGH (ref 0.8–1.2)
Prothrombin Time: 16.9 seconds — ABNORMAL HIGH (ref 11.4–15.2)

## 2020-09-27 LAB — LACTIC ACID, PLASMA
Lactic Acid, Venous: 3.5 mmol/L (ref 0.5–1.9)
Lactic Acid, Venous: 1.7 mmol/L (ref 0.5–1.9)

## 2020-09-27 IMAGING — DX DG CHEST 1V PORT
1 series · 1 of 1 positions shown · non-contrast
Comparison: Shoulder radiograph [DATE] and chest
radiograph [DATE]

CLINICAL DATA: Questionable sepsis evaluate for abnormality.
Presenting from dialysis with acute complaint of body aches, chills
and shortness of breath

EXAM:
PORTABLE CHEST 1 VIEW

[chest]
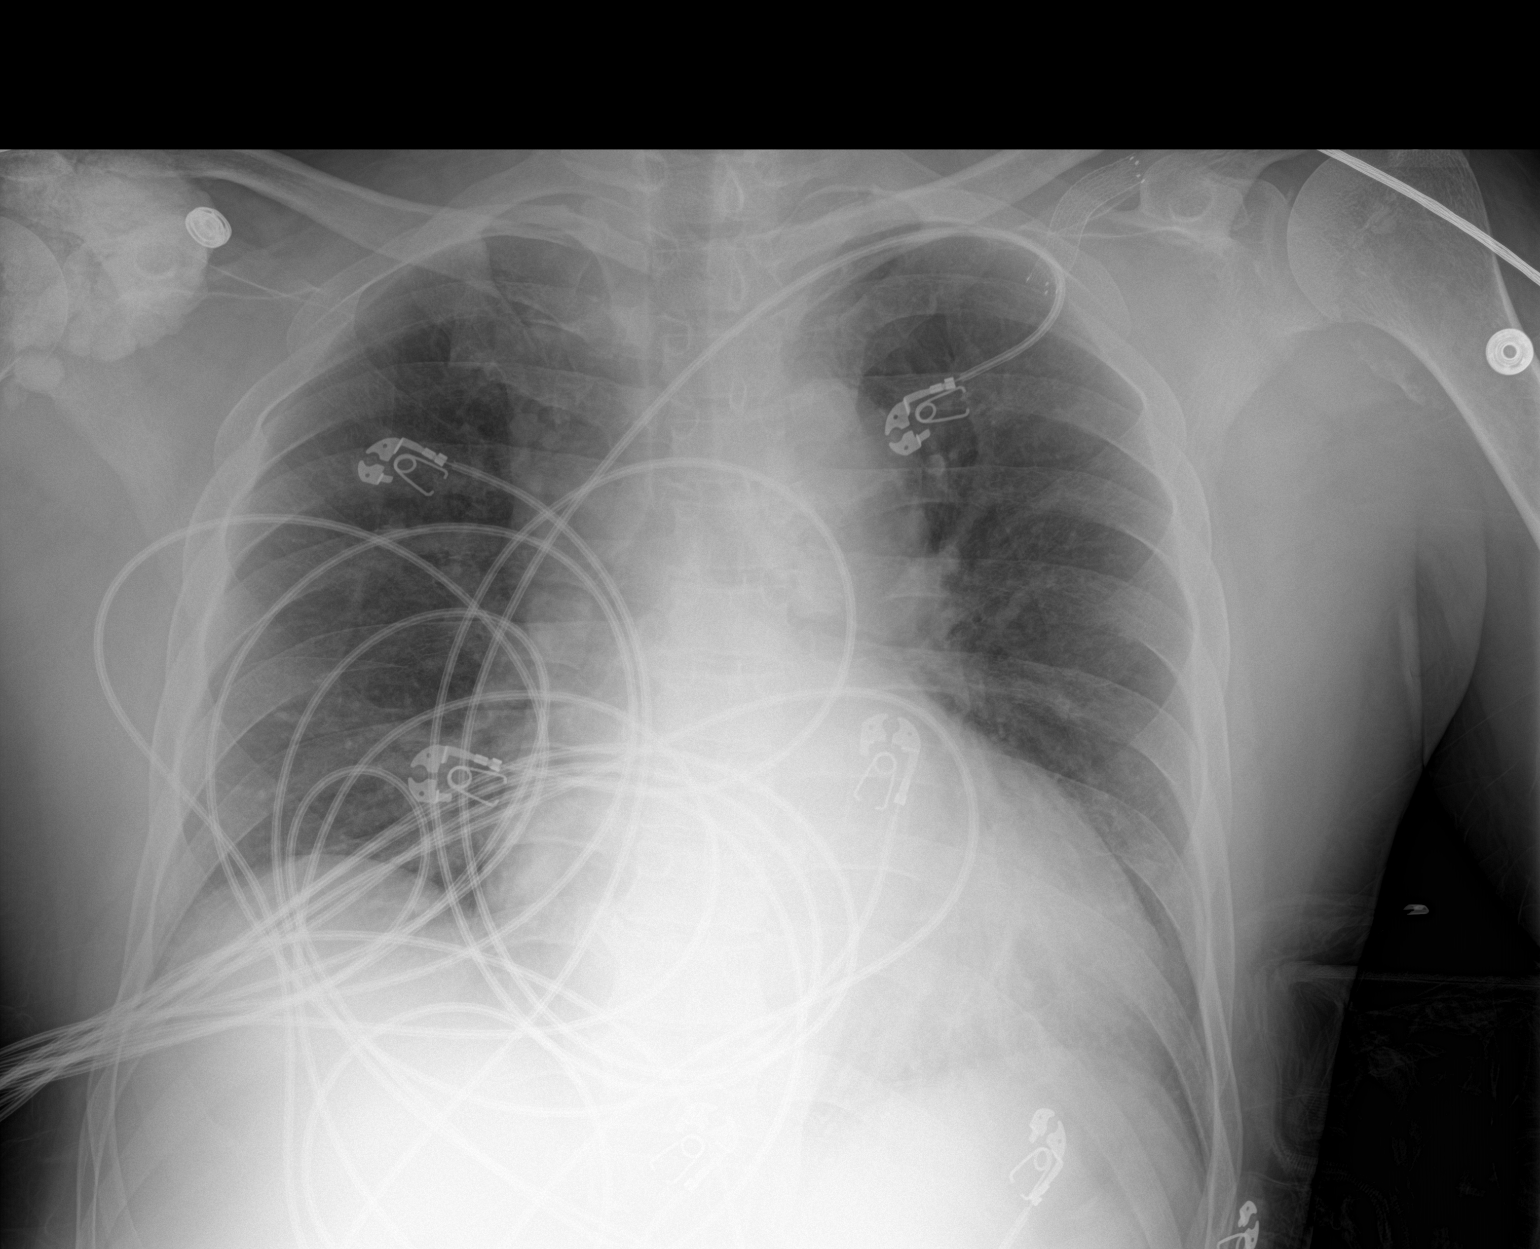

[1 of 1 positions shown; findings below may reference images not displayed]

FINDINGS: Cardiomegaly. Low lung volumes. Streaky left basilar opacities. No
significant pleural effusion or visible pneumothorax. Left vascular
stent. Similar appearance of the mass like calcific density over the
right shoulder. There is hazy calcification along the left shoulder.
IMPRESSION: 1. Low lung volumes with streaky left basilar opacities which could
represent atelectasis or infection.

## 2020-09-27 IMAGING — CT CT ABD-PELV W/O CM
2 of 4 series · 15 of 46 positions shown, 17 images · non-contrast
Comparison: None.

CLINICAL DATA: Abdominal pain.  Chronic renal failure

EXAM:
CT ABDOMEN AND PELVIS WITHOUT CONTRAST
TECHNIQUE: Multidetector CT imaging of the abdomen and pelvis was performed
following the standard protocol without IV contrast.

[Series 3: ap without · axial · non-contrast · 0.75mm/px · z∈[+818,+1243]mm · 12 of 95 slices shown, 14 images]
[im 5/95  soft-tissue]
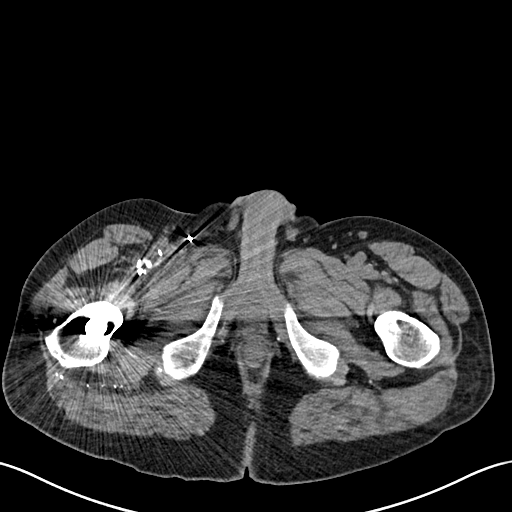
[im 5/95  bone]
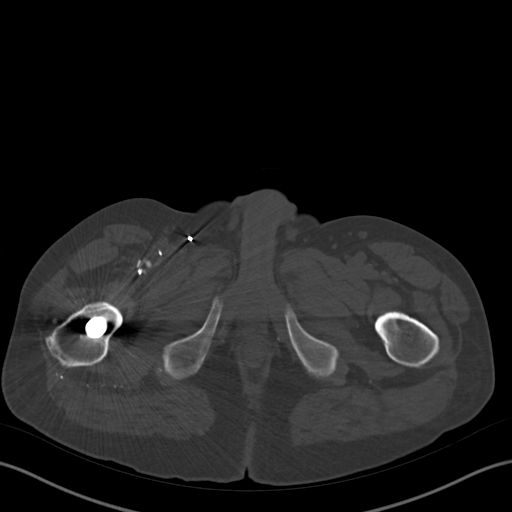
[im 15/95  soft-tissue]
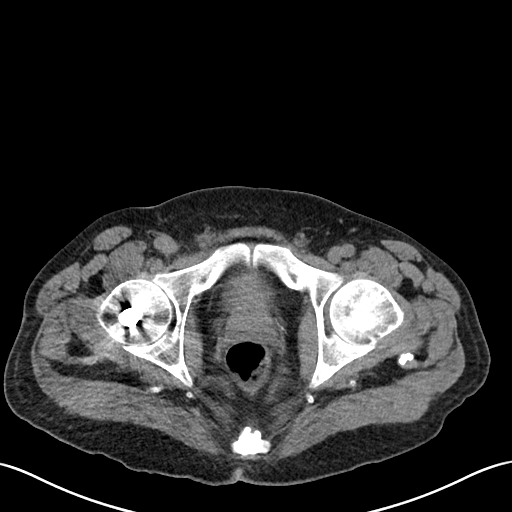
[im 20/95  soft-tissue]
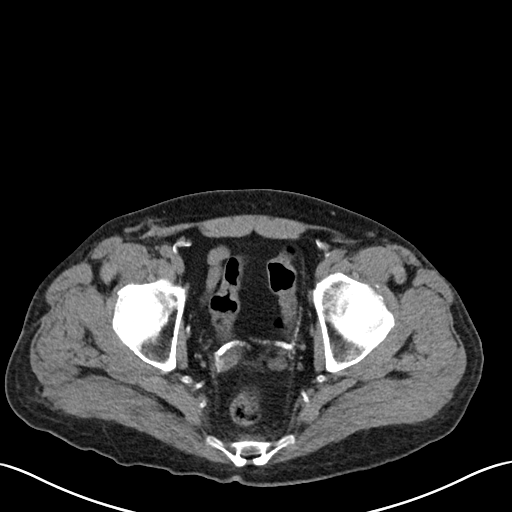
[im 30/95  soft-tissue]
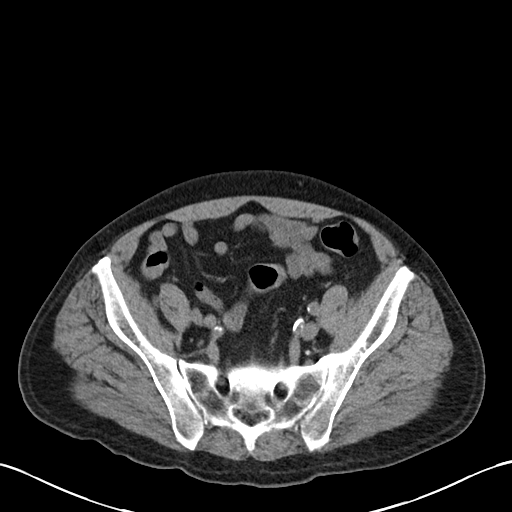
[im 35/95  soft-tissue]
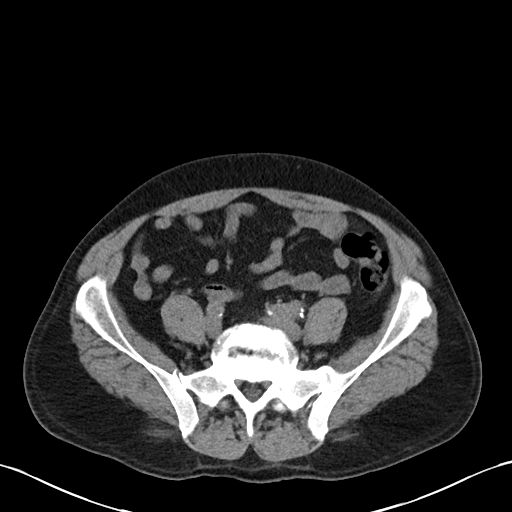
[im 45/95  soft-tissue]
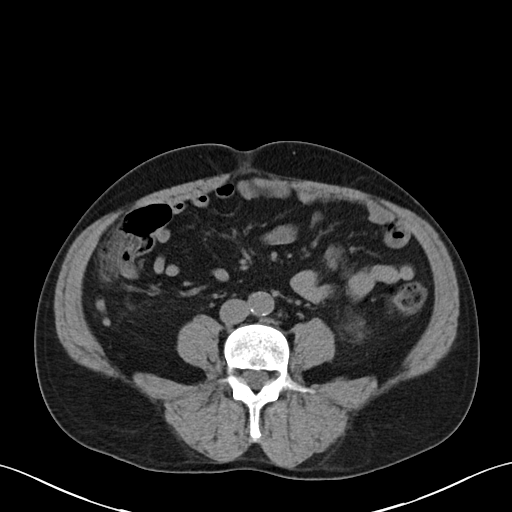
[im 50/95  soft-tissue]
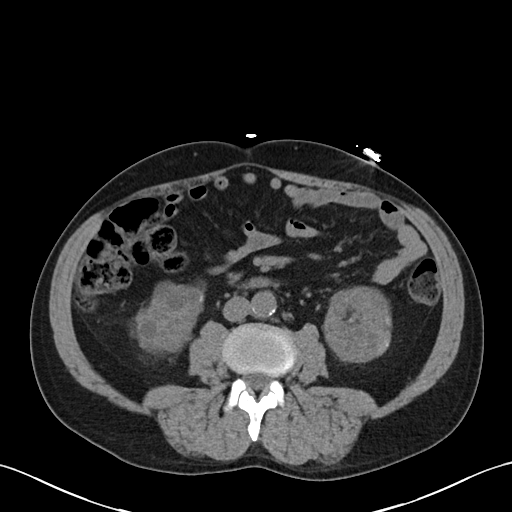
[im 60/95  soft-tissue]
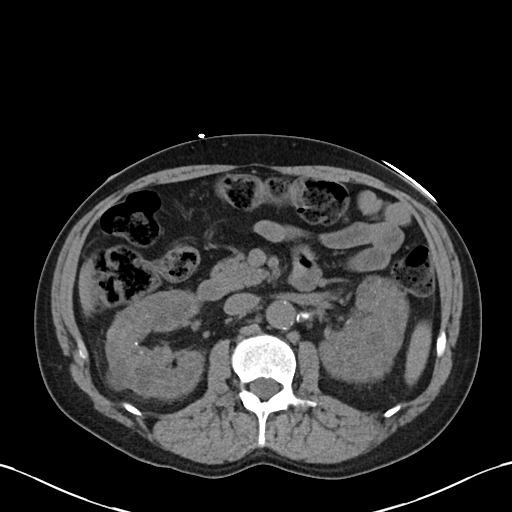
[im 65/95  soft-tissue]
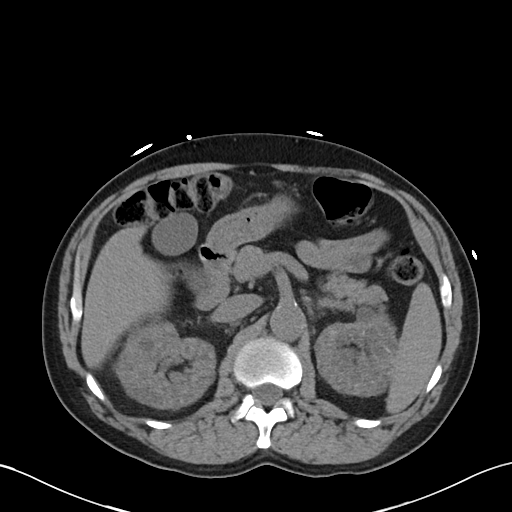
[im 65/95  bone]
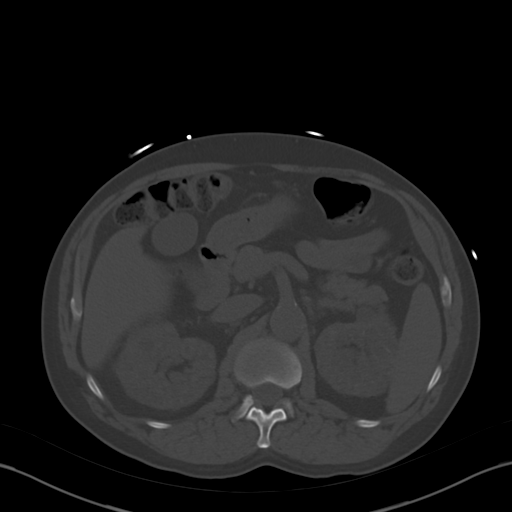
[im 75/95  soft-tissue]
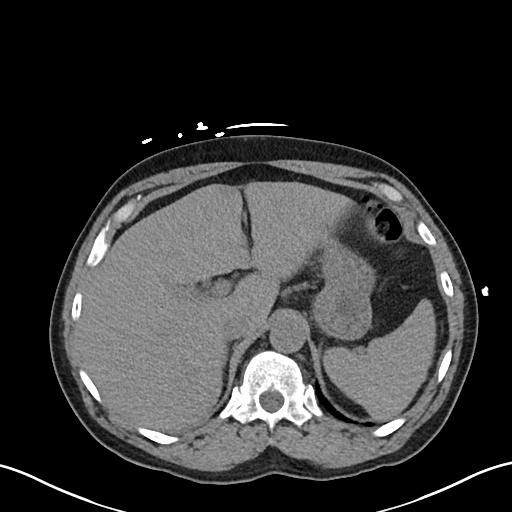
[im 80/95  soft-tissue]
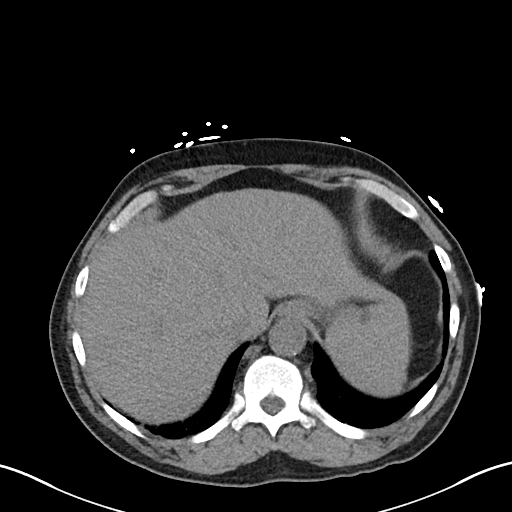
[im 90/95  soft-tissue]
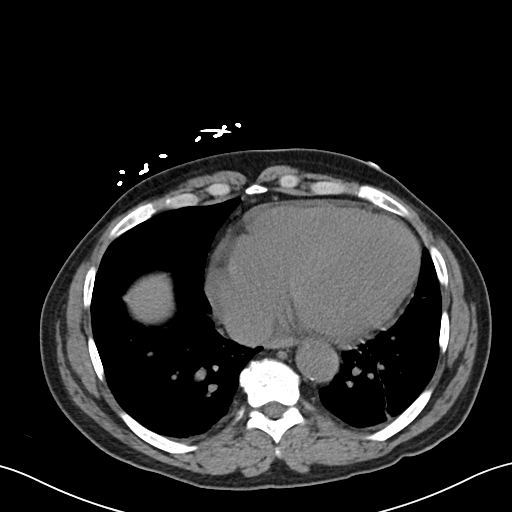

[Series 6: cor · coronal · 0.73mm/px · 3 of 90 slices shown]
[im 30/90  soft-tissue]
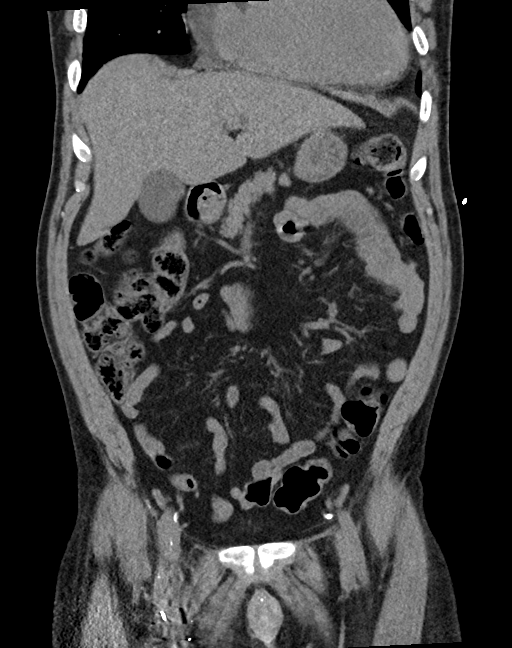
[im 40/90  soft-tissue]
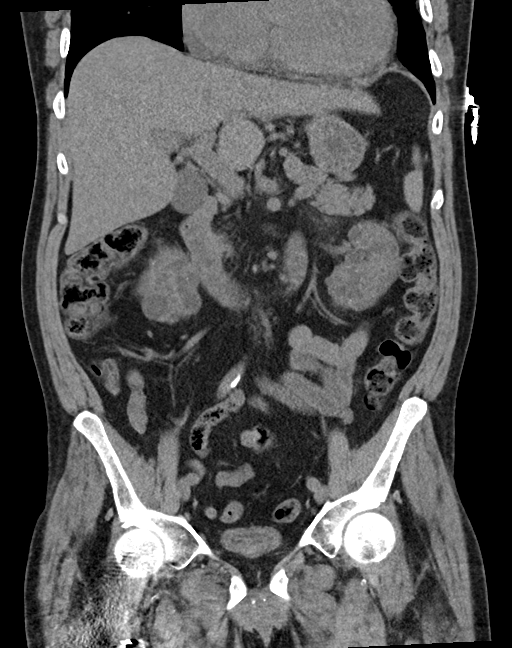
[im 50/90  soft-tissue]
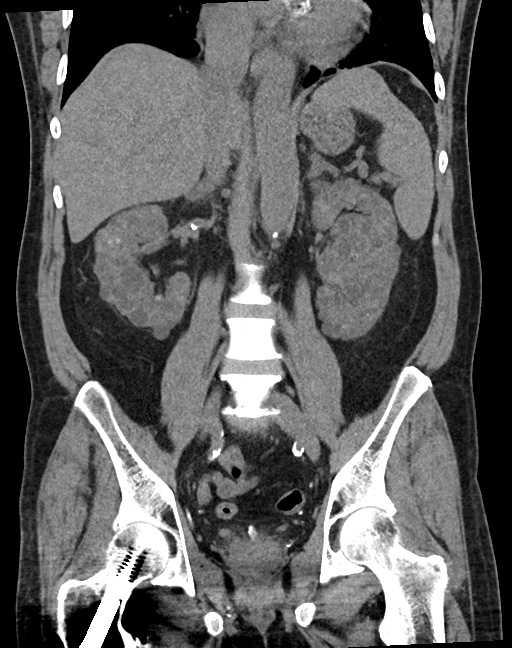

[15 of 46 positions shown; findings below may reference images not displayed]

FINDINGS: Lower chest: There is bibasilar atelectatic change. There is no lung
base edema or consolidation. There are multiple foci of coronary
artery calcification.

Hepatobiliary: No focal liver lesions are evident on this
noncontrast enhanced study. Gallbladder wall is not appreciably
thickened. There is no biliary duct dilatation.

Pancreas: No pancreatic mass or inflammatory focus.

Spleen: No splenic lesions are evident.

Adrenals/Urinary Tract: Adrenals bilaterally appear unremarkable.
Innumerable cysts are noted throughout each kidney, several of which
show peripheral calcification. There is a calcified mass arising
from the periphery of the lower pole of the left kidney measuring
1.9 x 1.1 cm. Largest well-defined individual cyst is seen arising
from the lateral right kidney measuring 2.6 x 2.2 cm. There is no
appreciable hydronephrosis on either side. There are scattered
suspected 1 mm calculi in each kidney in addition to areas of
suspected calcification in the periphery of multiple cysts. There is
no ureteral calculus on either side. Urinary bladder is midline with
equivocal urinary bladder wall thickening.

Stomach/Bowel: There is no appreciable bowel wall or mesenteric
thickening. There is no appreciable bowel obstruction. The terminal
ileum appears normal. There is no appreciable free air or portal
venous air.

Vascular/Lymphatic: There is no abdominal aortic aneurysm. There is
aortic atherosclerosis. There are also foci of calcification major
pelvic and mesenteric arterial vessels. There is no appreciable
adenopathy in the abdomen or pelvis.

Reproductive: Prostate and seminal vesicles are normal in size and
contour. There are calcifications in seminal vesicles bilaterally.

Other: Appendix appears normal. No evident abscess or ascites in the
abdomen or pelvis. There is slight fat in the umbilicus.

Musculoskeletal: Postoperative changes noted in the proximal right
femur. Several metallic pellets are noted in the proximal right
femur region. Degenerative change noted in the lumbar spine with
vacuum phenomenon at L5-S1 and to a lesser degree at L4-5. No
blastic or lytic bone lesions are evident. No intramuscular lesions.
IMPRESSION: 1. Findings indicative of adult polycystic kidney disease. Multiple
calcifications are noted in the periphery of several cysts which is
typical with this entity. A suspected collapsed cyst with
calcification is noted in the periphery of the left kidney measuring
1.9 x 1.1 cm.

2. Scattered 1 mm calculi in each kidney. No hydronephrosis or
ureteral calculus on either side. Equivocal thickening of the
urinary bladder wall could indicate a degree of cystitis.
Correlation with urinalysis in this regard advised.

3. No evident bowel obstruction. No abscess in the abdomen or
pelvis. Appendix appears normal.

4. Aortic Atherosclerosis ([R4]-[R4]). Multiple foci of pelvic and
mesenteric arterial vascular calcification noted. There are foci of
coronary artery calcification.

## 2020-09-27 MED ORDER — ALBUTEROL SULFATE (2.5 MG/3ML) 0.083% IN NEBU: 2.5000 mg | INHALATION_SOLUTION | RESPIRATORY_TRACT | Status: DC | PRN

## 2020-09-27 MED ORDER — VANCOMYCIN HCL 1750 MG/350ML IV SOLN
1750.0000 mg | Freq: Once | INTRAVENOUS | Status: DC
  Filled 2020-09-27: qty 350

## 2020-09-27 MED ORDER — METOPROLOL TARTRATE 50 MG PO TABS
75.0000 mg | ORAL_TABLET | Freq: Two times a day (BID) | ORAL | Status: DC
  Administered 2020-09-27: 75 mg via ORAL
  Filled 2020-09-27: qty 1

## 2020-09-27 MED ORDER — SODIUM CHLORIDE 0.9 % IV SOLN
2.0000 g | Freq: Once | INTRAVENOUS | Status: AC
  Administered 2020-09-27: 2 g via INTRAVENOUS
  Filled 2020-09-27: qty 2

## 2020-09-27 MED ORDER — CINACALCET HCL 30 MG PO TABS
30.0000 mg | ORAL_TABLET | Freq: Every day | ORAL | Status: DC
  Administered 2020-10-02 – 2020-10-13 (×9): 30 mg via ORAL
  Filled 2020-09-27 (×17): qty 1

## 2020-09-27 MED ORDER — POLYETHYLENE GLYCOL 3350 17 G PO PACK: 17.0000 g | PACK | Freq: Every day | ORAL | Status: DC | PRN

## 2020-09-27 MED ORDER — ACETAMINOPHEN 325 MG PO TABS
650.0000 mg | ORAL_TABLET | Freq: Once | ORAL | Status: AC
  Administered 2020-09-27: 650 mg via ORAL
  Filled 2020-09-27: qty 2

## 2020-09-27 MED ORDER — VANCOMYCIN HCL 2000 MG/400ML IV SOLN
2000.0000 mg | Freq: Once | INTRAVENOUS | Status: AC
  Administered 2020-09-27: 2000 mg via INTRAVENOUS
  Filled 2020-09-27: qty 400

## 2020-09-27 MED ORDER — GABAPENTIN 100 MG PO CAPS
200.0000 mg | ORAL_CAPSULE | Freq: Every day | ORAL | Status: DC
  Administered 2020-09-27: 200 mg via ORAL
  Filled 2020-09-27: qty 2

## 2020-09-27 MED ORDER — CALCIUM ACETATE (PHOS BINDER) 667 MG PO CAPS
667.0000 mg | ORAL_CAPSULE | Freq: Three times a day (TID) | ORAL | Status: DC
  Administered 2020-10-01 – 2020-10-06 (×13): 667 mg via ORAL
  Filled 2020-09-27 (×17): qty 1

## 2020-09-27 MED ORDER — VANCOMYCIN HCL IN DEXTROSE 1-5 GM/200ML-% IV SOLN
1000.0000 mg | INTRAVENOUS | Status: DC
  Administered 2020-09-29: 1000 mg via INTRAVENOUS
  Filled 2020-09-27 (×3): qty 200

## 2020-09-27 MED ORDER — ONDANSETRON HCL 4 MG PO TABS
4.0000 mg | ORAL_TABLET | Freq: Four times a day (QID) | ORAL | Status: DC | PRN
  Administered 2020-09-27: 4 mg via ORAL
  Filled 2020-09-27: qty 1

## 2020-09-27 MED ORDER — SODIUM CHLORIDE 0.9 % IV SOLN: 2.0000 g | INTRAVENOUS | Status: DC

## 2020-09-27 MED ORDER — METRONIDAZOLE IN NACL 5-0.79 MG/ML-% IV SOLN
500.0000 mg | Freq: Once | INTRAVENOUS | Status: AC
  Administered 2020-09-27: 500 mg via INTRAVENOUS
  Filled 2020-09-27: qty 100

## 2020-09-27 MED ORDER — ACETAMINOPHEN 650 MG RE SUPP: 650.0000 mg | Freq: Four times a day (QID) | RECTAL | Status: DC | PRN

## 2020-09-27 MED ORDER — HYDRALAZINE HCL 50 MG PO TABS
100.0000 mg | ORAL_TABLET | Freq: Two times a day (BID) | ORAL | Status: DC
  Administered 2020-09-27: 100 mg via ORAL
  Filled 2020-09-27: qty 2

## 2020-09-27 MED ORDER — HEPARIN SODIUM (PORCINE) 5000 UNIT/ML IJ SOLN
5000.0000 [IU] | Freq: Three times a day (TID) | INTRAMUSCULAR | Status: DC
  Administered 2020-09-27 – 2020-09-28 (×2): 5000 [IU] via SUBCUTANEOUS
  Filled 2020-09-27: qty 1

## 2020-09-27 MED ORDER — OXYCODONE HCL 5 MG PO TABS
5.0000 mg | ORAL_TABLET | ORAL | Status: DC | PRN
  Administered 2020-09-27 – 2020-10-10 (×4): 5 mg via ORAL
  Filled 2020-09-27 (×4): qty 1

## 2020-09-27 MED ORDER — SODIUM CHLORIDE 0.9 % IV SOLN: INTRAVENOUS | Status: AC

## 2020-09-27 MED ORDER — ACETAMINOPHEN 325 MG PO TABS
650.0000 mg | ORAL_TABLET | Freq: Four times a day (QID) | ORAL | Status: DC | PRN
Start: 2020-09-27 — End: 2020-09-28
  Administered 2020-09-27 – 2020-09-28 (×2): 650 mg via ORAL
  Filled 2020-09-27: qty 2

## 2020-09-27 MED ORDER — CLONIDINE HCL 0.1 MG PO TABS
0.3000 mg | ORAL_TABLET | Freq: Three times a day (TID) | ORAL | Status: DC
  Administered 2020-09-27: 0.3 mg via ORAL
  Filled 2020-09-27: qty 1
  Filled 2020-09-27: qty 3

## 2020-09-27 MED ORDER — ONDANSETRON HCL 4 MG/2ML IJ SOLN: 4.0000 mg | Freq: Four times a day (QID) | INTRAMUSCULAR | Status: DC | PRN

## 2020-09-27 MED ORDER — AMLODIPINE BESYLATE 10 MG PO TABS
10.0000 mg | ORAL_TABLET | Freq: Every morning | ORAL | Status: DC
  Administered 2020-09-27: 10 mg via ORAL
  Filled 2020-09-27: qty 1

## 2020-09-27 MED ORDER — SODIUM CHLORIDE 0.9 % IV SOLN: 1.0000 g | Freq: Two times a day (BID) | INTRAVENOUS | Status: DC

## 2020-09-27 MED ORDER — METRONIDAZOLE IN NACL 5-0.79 MG/ML-% IV SOLN
500.0000 mg | Freq: Two times a day (BID) | INTRAVENOUS | Status: DC
  Administered 2020-09-28: 500 mg via INTRAVENOUS
  Filled 2020-09-27 (×2): qty 100

## 2020-09-27 MED ORDER — CALCIUM ACETATE (PHOS BINDER) 667 MG PO CAPS
667.0000 mg | ORAL_CAPSULE | ORAL | Status: DC
  Administered 2020-10-01 – 2020-10-10 (×10): 667 mg via ORAL
  Filled 2020-09-27 (×3): qty 1

## 2020-09-27 NOTE — ED Triage Notes (Signed)
Pt arrives to ED via [facility] from dialysis with chief complaint of body aches chills and sob, tachy with ems HR 125

## 2020-09-27 NOTE — ED Provider Notes (Signed)
Care handoff received from [facility] PA-C at shift change please see previous provider note for full details of visit.  In short 50 year old male ESRD last dialyzed today and multiple other chronic medical problems presented for generalized weakness diarrhea abdominal pain and fatigue.  He was noted to be hypoxic on arrival tachycardic and febrile.  Code sepsis was initiated and broad-spectrum antibiotics were ordered.  Patient with leukocytosis of 23.2, chest x-ray concerning for pneumonia.  I have been asked to follow-up on patient CT abdomen pelvis prior to calling medicine service for admission. Physical Exam  BP (!) 143/108   Pulse (!) 118   Temp (!) 103.2 F (39.6 C) (Rectal)   Resp (!) 36   Ht 6' (1.829 m)   Wt 83.9 kg   SpO2 100%   BMI 25.09 kg/m   Physical Exam Constitutional:      General: He is not in acute distress.    Appearance: Normal appearance. He is ill-appearing. He is not diaphoretic.  HENT:     Head: Normocephalic and atraumatic.     Right Ear: External ear normal.     Left Ear: External ear normal.  Eyes:     Conjunctiva/sclera: Conjunctivae normal.     Pupils: Pupils are equal, round, and reactive to light.  Cardiovascular:     Rate and Rhythm: Tachycardia present.  Pulmonary:     Effort: Pulmonary effort is normal. Tachypnea present. No respiratory distress.     Breath sounds: Normal air entry.  Abdominal:     General: Abdomen is flat.     Palpations: Abdomen is soft.     Tenderness: There is no abdominal tenderness.  Musculoskeletal:        General: Normal range of motion.     Cervical back: Normal range of motion.  Skin:    General: Skin is warm and dry.  Neurological:     General: No focal deficit present.     Mental Status: He is alert.  Psychiatric:        Mood and Affect: Mood normal.     ED Course/Procedures   Clinical Course as of 09/27/20 1532  Mon Sep 27, 2020  1436 WBC(!): 23.2 [JS]  1436 Pulse Rate(!): 143 [JS]  1436 Temp:  99.4 F (37.4 C) [JS]  1529 IMPRESSION: 1. Findings indicative of adult polycystic kidney disease. Multiple calcifications are noted in the periphery of several cysts which is typical with this entity. A suspected collapsed cyst with calcification is noted in the periphery of the left kidney measuring 1.9 x 1.1 cm.  2. Scattered 1 mm calculi in each kidney. No hydronephrosis or ureteral calculus on either side. Equivocal thickening of the urinary bladder wall could indicate a degree of cystitis. Correlation with urinalysis in this regard advised.  3. No evident bowel obstruction. No abscess in the abdomen or pelvis. Appendix appears normal.  4. Aortic Atherosclerosis (ICD10-I70.0). Multiple foci of pelvic and mesenteric arterial vascular calcification noted. There are foci of coronary artery calcification. [BM]    Clinical Course User Index [BM] Deliah Boston, PA-C [JS] Janeece Fitting, PA-C    Procedures  MDM  I reviewed and interpreted labs which include: Lactic elevated 3.5. CMP consistent with ESRD, potassium within normal limits. Covid/influenza panel negative. CBC shows leuks ptosis 23.2, anemia 11.0.  CXR:  IMPRESSION:  1. Low lung volumes with streaky left basilar opacities which could  represent atelectasis or infection.   CTAP:  IMPRESSION:  1. Findings indicative of adult polycystic  kidney disease. Multiple  calcifications are noted in the periphery of several cysts which is  typical with this entity. A suspected collapsed cyst with  calcification is noted in the periphery of the left kidney measuring  1.9 x 1.1 cm.    2. Scattered 1 mm calculi in each kidney. No hydronephrosis or  ureteral calculus on either side. Equivocal thickening of the  urinary bladder wall could indicate a degree of cystitis.  Correlation with urinalysis in this regard advised.    3. No evident bowel obstruction. No abscess in the abdomen or  pelvis. Appendix appears  normal.    4. Aortic Atherosclerosis (ICD10-I70.0). Multiple foci of pelvic and  mesenteric arterial vascular calcification noted. There are foci of  coronary artery calcification.   Informed patient does not produce urine.  Broad-spectrum antibiotics ordered along with Tylenol and normal saline infusion.  Large fluid boluses were avoided as patient is ESRD and his blood pressure is stable.  I reevaluated this patient he is resting in bed no acute distress remains slightly tachypneic, he states understanding of care plan he is agreeable for admission.  Case discussed with Dr. Sherry Ruffing; plan of care is admission to medicine service for treatment of sepsis secondary to community-acquired pneumonia causing respiratory failure.  Patient currently on 2 L nasal cannula SPO2 100% - 4:08 PM: Consult with Dr. Corinna Capra, patient has been accepted for admission.   Note: Portions of this report may have been transcribed using voice recognition software. Every effort was made to ensure accuracy; however, inadvertent computerized transcription errors may still be present.   Gari Crown 09/27/20 1610    Tegeler, Gwenyth Allegra, MD 09/28/20 1340

## 2020-09-27 NOTE — ED Notes (Signed)
Pt oxygen saturations dropped to 81% with exertion. Respirations increased to 50. Pt placed on 2L [place] with slow improvement to 96%. EDP aware. Will cont to monitor.

## 2020-09-27 NOTE — ED Notes (Signed)
Informed pt that urine was needed.  Pt stated he has not urinated in four years.

## 2020-09-27 NOTE — ED Notes (Signed)
Flagyl completed upon the arrival of this RN shift, previous RN did not address medication in [facility]

## 2020-09-27 NOTE — ED Notes (Signed)
Xray at the bedside.

## 2020-09-27 NOTE — H&P (Addendum)
History and Physical    Patrick Anthony [id number] DOB: 12/26/1970 DOA: 09/27/2020  PCP: [name], MD  Patient coming from:  Home   I have personally briefly reviewed patient's old medical records in [place]  Chief Complaint: Fever with chills, generalized weakness  HPI: Patrick Anthony is a 50 y.o. 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  Patient symptoms started approximately 10 days on Wednesday, when he all of a sudden developed generalized weakness with decreased energy, associated with tachycardia and fever with chills, chest pain with shortness of breath and mild nausea with abdominal pain. Symptoms were persisting over time and not getting any better.  He decided come to ED for evaluation of his symptoms. Surprisingly he did not mention any of this at his dialysis unit, nor he had any evaluation for sepsis at the time. He remains anuric, with no reported hematuria or dysuria.  Denies having diarrhea.   In the ED, he was febrile to 103.2 F.  Blood pressure was stable 142/108, with tachycardia, heart rate up to 143/min. No hypoxia with 100% room air. CBC was significant for leukocytosis 23.2, without anemia hemoglobin 11. Chemistry showed elevation of creatinine to 10.3, similar to his known ESRD.  Lactic acid was elevated at 3.5. Noted mild elevation of AST to 42. Chest x-ray showed possible opacity at left lower base concerning for pneumonia.  CT abdomen pelvis revealed no acute findings, confirmed presence of adult polycystic kidney disease.  Patient tested negative for coronavirus 2019, influenza. In the ED he received treatment with IV  vancomycin cefepime Flagyl, IV fluids and will be admitted to hospital for further evaluation and treatment  Review of Systems: Review of Systems  Constitutional: Positive for chills, fever and malaise/fatigue.  HENT: Negative for congestion, ear pain and nosebleeds.   Eyes: Negative for blurred vision.  Respiratory: Positive for shortness of breath. Negative for cough, hemoptysis and sputum production.   Cardiovascular: Positive for chest pain. Negative for palpitations, orthopnea and leg swelling.  Gastrointestinal: Positive for abdominal pain. Negative for constipation, diarrhea, heartburn, nausea and vomiting.  Genitourinary: Negative for dysuria, frequency, hematuria and urgency.  Musculoskeletal: Positive for joint pain and myalgias.  Neurological: Negative for dizziness, sensory change, speech change and focal weakness.  Psychiatric/Behavioral: Negative for depression. The patient is not nervous/anxious.    As per HPI otherwise all other systems reviewed and are negative.   Past Medical History:  Diagnosis Date  . Chronic kidney disease    patient on dialysis tues-thurs-sat  . Cirrhosis of liver ([place])   . Drug addiction ([place])   . ETOH abuse   . Hepatitis   . Hypertension   . Renal disorder renal failure  . Sleep apnea   . Systolic and diastolic CHF, 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[place]) 04/19/2013    Past Surgical History:  Procedure Laterality Date  . AV FISTULA PLACEMENT Right   . AV FISTULA PLACEMENT Left 09/03/2013   Procedure: ARTERIOVENOUS (AV) FISTULA CREATION - LEFT BRACHIOCEPHALIC;  Surgeon: Conrad Blyn, MD;  Location: [facility];  Service: Vascular;  Laterality: Left;  . AV FISTULA PLACEMENT Left 10/30/2019   Procedure: INSERTION OF ARTERIOVENOUS (AV) GORE-TEX GRAFT ARM;  Surgeon: Rosetta Posner, MD;  Location: [facility]  OR;  Service: Vascular;  Laterality: Left;  . INSERTION OF DIALYSIS CATHETER N/A 08/29/2013   Procedure: INSERTION OF DIALYSIS CATHETER;  Surgeon: Mal Misty, MD;   Location: [place];  Service: Vascular;  Laterality: N/A;  . INSERTION OF DIALYSIS CATHETER Left 10/30/2019   Procedure: INSERTION OF DIALYSIS CATHETER LEFT INTERNAL JUGULAR;  Surgeon: Rosetta Posner, MD;  Location: [place];  Service: Vascular;  Laterality: Left;  . PERIPHERAL VASCULAR CATHETERIZATION N/A 01/06/2015   Procedure: A/V Shuntogram/Fistulagram;  Surgeon: Katha Cabal, MD;  Location: [place] CV LAB;  Service: Cardiovascular;  Laterality: N/A;  . PERIPHERAL VASCULAR CATHETERIZATION Left 01/06/2015   Procedure: A/V Shunt Intervention;  Surgeon: Katha Cabal, MD;  Location: [place] CV LAB;  Service: Cardiovascular;  Laterality: Left;    Social History  reports that he has been smoking cigarettes. He has been smoking about 0.20 packs per day. He has never used smokeless tobacco. He reports that he does not drink alcohol and does not use drugs.  No Known Allergies  Family History  Problem Relation Age of Onset  . Hypertension Mother   . Cancer Mother        breast   Prior to Admission medications   Medication Sig Start Date End Date Taking? Authorizing Provider  amLODipine (NORVASC) 10 MG tablet Take 10 mg by mouth every morning. 09/24/20  Yes [provider]  AURYXIA 1 GM 210 MG(Fe) tablet Take 420 mg by mouth 3 (three) times daily. 07/20/20  Yes [provider]  calcium acetate (PHOSLO) 667 MG capsule Take 3 capsules (2,001 mg total) by mouth 3 (three) times daily with meals. Patient taking differently: Take 667-2,001 mg by mouth See admin instructions. Take 3 capsules (2001 mg) by mouth with each meal & take 1 capsule (667 mg) by mouth with each snack 09/04/13  Yes Ivor Costa, MD  cloNIDine (CATAPRES) 0.3 MG tablet Take 0.3 mg by mouth in the morning, at noon, and at bedtime.   Yes [provider]  Darbepoetin Alfa (ARANESP, ALBUMIN FREE, IJ) Darbepoetin Alfa (Aranesp) 07/05/20 07/04/21 Yes [provider]  gabapentin (NEURONTIN) 100  MG capsule Take 200 mg by mouth at bedtime.   Yes [provider]  heparin 1000 unit/mL SOLN injection Heparin Sodium (Porcine) 1,000 Units/mL Systemic 04/23/20 04/22/21 Yes [provider]  hydrALAZINE (APRESOLINE) 100 MG tablet Take 100 mg by mouth 2 (two) times daily. 05/11/20  Yes [provider]  metoprolol tartrate (LOPRESSOR) 25 MG tablet Take 75 mg by mouth 2 (two) times daily.  04/12/16  Yes [provider]  nitroGLYCERIN (NITROSTAT) 0.4 MG SL tablet Place 0.4 mg under the tongue every 5 (five) minutes x 3 doses as needed for chest pain.    Yes [provider]  SENSIPAR 30 MG tablet Take 30 mg by mouth daily. 09/15/19  Yes [provider]  VIAGRA 50 MG tablet Take 50 mg by mouth daily as needed for erectile dysfunction.  09/24/19  Yes [provider]  oxyCODONE-acetaminophen (PERCOCET) 7.5-325 MG tablet Take 1 tablet by mouth every 4 (four) hours as needed for severe pain. Patient not taking: No sig reported 10/30/19 10/29/20  Karoline Caldwell, PA-C    Physical Exam: Vitals:   09/27/20 1445 09/27/20 1451 09/27/20 1500 09/27/20 1515  BP: (!) 161/109  (!) 163/105 (!) 143/108  Pulse:    (!) 118  Resp: (!) 37  (!) 23 (!) 36  Temp:  (!) 103.2 F (39.6 C)  TempSrc:  Rectal    SpO2:    100%  Weight:      Height:        Constitutional: Ill-appearing, nontoxic but in mild distress, tremulous Vitals:   09/27/20 1445 09/27/20 1451 09/27/20 1500 09/27/20 1515  BP: (!) 161/109  (!) 163/105 (!) 143/108  Pulse:    (!) 118  Resp: (!) 37  (!) 23 (!) 36  Temp:  (!) 103.2 F (39.6 C)    TempSrc:  Rectal    SpO2:    100%  Weight:      Height:       Eyes: PERRL, lids and conjunctivae normal ENMT: Mucous membranes are moist. Posterior pharynx clear of any exudate or lesions.Normal dentition.  Neck: normal, supple, no masses, no thyromegaly Respiratory: Bibasal crackles, no wheezing. Normal respiratory effort. No accessory muscle use.   Cardiovascular: Regular rate and rhythm, no murmurs / rubs / gallops. No extremity edema. 2+ pedal pulses. No carotid bruits.  Abdomen: no tenderness, no masses palpated. No hepatosplenomegaly. Bowel sounds positive.  Musculoskeletal: no clubbing / cyanosis. No joint deformity upper and lower extremities. Good ROM, no contractures. Normal muscle tone.  Skin: no rashes, lesions, ulcers. No induration.  Left upper extremity AV fistula without induration, discharge Neurologic: CN 2-12 grossly intact. Sensation intact, DTR normal. Strength 5/5 in all 4.  Psychiatric: Normal judgment and insight. Alert and oriented x 3. Normal mood.   Labs on Admission: I have personally reviewed following labs and imaging studies  CBC: Recent Labs  Lab 09/27/20 1125  WBC 23.2*  HGB 11.0*  HCT 32.6*  MCV 82.3  PLT 83*    Basic Metabolic Panel: Recent Labs  Lab 09/27/20 1125 09/27/20 1416  NA 137 137  K 4.4 4.6  CL 92* 92*  CO2 25 23  GLUCOSE 86 93  BUN 45* 48*  CREATININE 9.82* 10.30*  CALCIUM 8.8* 8.6*   GFR: Estimated Creatinine Clearance: 9.5 mL/min (A) (by C-G formula based on SCr of 10.3 mg/dL (H)).  Liver Function Tests: Recent Labs  Lab 09/27/20 1416  AST 42*  ALT 15  ALKPHOS 107  BILITOT 2.1*  PROT 8.4*  ALBUMIN 2.9*    Urine analysis:    Component Value Date/Time   COLORURINE YELLOW 08/28/2013 1426   APPEARANCEUR HAZY (A) 08/28/2013 1426   LABSPEC 1.017 08/28/2013 1426   PHURINE 6.0 08/28/2013 1426   GLUCOSEU 100 (A) 08/28/2013 1426   HGBUR SMALL (A) 08/28/2013 1426   BILIRUBINUR NEGATIVE 08/28/2013 1426   KETONESUR NEGATIVE 08/28/2013 1426   PROTEINUR >300 (A) 08/28/2013 1426   UROBILINOGEN 0.2 08/28/2013 1426   NITRITE NEGATIVE 08/28/2013 1426   LEUKOCYTESUR SMALL (A) 08/28/2013 1426    Radiological Exams on Admission: CT ABDOMEN PELVIS WO CONTRAST  Result Date: 09/27/2020 CLINICAL DATA:  Abdominal pain.  Chronic renal failure EXAM: CT ABDOMEN AND PELVIS  WITHOUT CONTRAST TECHNIQUE: Multidetector CT imaging of the abdomen and pelvis was performed following the standard protocol without IV contrast. COMPARISON:  None. FINDINGS: Lower chest: There is bibasilar atelectatic change. There is no lung base edema or consolidation. There are multiple foci of coronary artery calcification. Hepatobiliary: No focal liver lesions are evident on this noncontrast enhanced study. Gallbladder wall is not appreciably thickened. There is no biliary duct dilatation. Pancreas: No pancreatic mass or inflammatory focus. Spleen: No splenic lesions are evident. Adrenals/Urinary Tract: Adrenals bilaterally appear unremarkable. Innumerable cysts are noted throughout each kidney, several of which show peripheral calcification. There is a calcified  mass arising from the periphery of the lower pole of the left kidney measuring 1.9 x 1.1 cm. Largest well-defined individual cyst is seen arising from the lateral right kidney measuring 2.6 x 2.2 cm. There is no appreciable hydronephrosis on either side. There are scattered suspected 1 mm calculi in each kidney in addition to areas of suspected calcification in the periphery of multiple cysts. There is no ureteral calculus on either side. Urinary bladder is midline with equivocal urinary bladder wall thickening. Stomach/Bowel: There is no appreciable bowel wall or mesenteric thickening. There is no appreciable bowel obstruction. The terminal ileum appears normal. There is no appreciable free air or portal venous air. Vascular/Lymphatic: There is no abdominal aortic aneurysm. There is aortic atherosclerosis. There are also foci of calcification major pelvic and mesenteric arterial vessels. There is no appreciable adenopathy in the abdomen or pelvis. Reproductive: Prostate and seminal vesicles are normal in size and contour. There are calcifications in seminal vesicles bilaterally. Other: Appendix appears normal. No evident abscess or ascites in the  abdomen or pelvis. There is slight fat in the umbilicus. Musculoskeletal: Postoperative changes noted in the proximal right femur. Several metallic pellets are noted in the proximal right femur region. Degenerative change noted in the lumbar spine with vacuum phenomenon at L5-S1 and to a lesser degree at L4-5. No blastic or lytic bone lesions are evident. No intramuscular lesions. IMPRESSION: 1. Findings indicative of adult polycystic kidney disease. Multiple calcifications are noted in the periphery of several cysts which is typical with this entity. A suspected collapsed cyst with calcification is noted in the periphery of the left kidney measuring 1.9 x 1.1 cm. 2. Scattered 1 mm calculi in each kidney. No hydronephrosis or ureteral calculus on either side. Equivocal thickening of the urinary bladder wall could indicate a degree of cystitis. Correlation with urinalysis in this regard advised. 3. No evident bowel obstruction. No abscess in the abdomen or pelvis. Appendix appears normal. 4. Aortic Atherosclerosis (ICD10-I70.0). Multiple foci of pelvic and mesenteric arterial vascular calcification noted. There are foci of coronary artery calcification. Electronically Signed   By: Lowella Grip III M.D.   On: 09/27/2020 15:25   DG Chest Port 1 View  Result Date: 09/27/2020 CLINICAL DATA:  Questionable sepsis evaluate for abnormality. Presenting from dialysis with acute complaint of body aches, chills and shortness of breath EXAM: PORTABLE CHEST 1 VIEW COMPARISON:  Shoulder radiograph September 17, 2020 and chest radiograph August 29, 2013 FINDINGS: Cardiomegaly. Low lung volumes. Streaky left basilar opacities. No significant pleural effusion or visible pneumothorax. Left vascular stent. Similar appearance of the mass like calcific density over the right shoulder. There is hazy calcification along the left shoulder. IMPRESSION: 1. Low lung volumes with streaky left basilar opacities which could represent  atelectasis or infection. Electronically Signed   By: Dahlia Bailiff MD   On: 09/27/2020 14:59   EKG: Independently reviewed.  Telemetry independently reviewed.  Sinus tachycardia up to 120/min.  Assessment/Plan Principal Problem:   Pneumonia due to infectious agent Active Problems:   Sepsis ([place])   Chronic diastolic CHF (congestive heart failure) (H[facility])   HTN (hypertension)   Anemia in chronic kidney disease   ESRD on dialysis ([facility])   ADPKD (autosomal dominant polycystic kidney disease)   Sepsis due to pneumonia ([facility])   Lactic acidosis   Sepsis likely due to pneumonia due to infectious agent affecting left lower lobe Lactic acidosis Patient presented to hospital with signs of sepsis, source likely related to pneumonia, although cannot  completely exclude intra-abdominal pathology like infected cyst, or bacteremia. Continue broad-spectrum coverage with IV vancomycin, cefepime and Flagyl.  Follow blood cultures. Monitor closely for any improvement or deterioration of clinical condition  End-stage renal disease due to APKD Patient receives routine hemodialysis Monday Wednesday Friday through left upper extremity AV fistula. Continue PhosLo with meals for hyperphosphatemia. Continue Sensipar for secondary hyperparathyroidism Nephrology consult requested to follow patient during this admission  Anemia in chronic kidney disease Hemoglobin stable at 11, continue to monitor  Hypertension related to renal disease Continue home regimen of amlodipine, clonidine, metoprolol, hydralazine, adjust as needed.  Chronic diastolic congestive heart failure (EF 55-65%) No obvious evidence of acute CHF exacerbation.  Euvolemic on exam. Continue fluid restriction and volume control with dialysis  Liver cirrhosis due to chronic hepatitis C Patient has a history of chronic HCV infection. HIV ELISA was nonreactive during his prior hospital stay.  DVT prophylaxis: Heparin Code Status:        Full  code Family Communication:  Disposition Plan:   Patient is from:  Home  Anticipated DC to:  Home  Anticipated DC date:  09/30/2020  Anticipated DC barriers: None  Consults called:  Nephrology, secure message sent to nephrology Admission status:  Inapatient   Severity of Illness: The appropriate patient status for this patient is INPATIENT. Inpatient status is judged to be reasonable and necessary in order to provide the required intensity of service to ensure the patient's safety. The patient's presenting symptoms, physical exam findings, and initial radiographic and laboratory data in the context of their chronic comorbidities is felt to place them at high risk for further clinical deterioration. Furthermore, it is not anticipated that the patient will be medically stable for discharge from the hospital within 2 midnights of admission. The following factors support the patient status of inpatient.   " The patient's presenting symptoms include: Fever with chills, generalized weakness, shortness of breath, abdominal pain " The worrisome physical exam findings include: Tachycardia with fever, rales, abdominal pain " The initial radiographic and laboratory data are worrisome because of suspicion of bibasilar pneumonia " The chronic co-morbidities include end-stage renal disease on hemodialysis, hypertension,  * I certify that at the point of admission it is my clinical judgment that the patient will require inpatient hospital care spanning beyond 2 midnights from the point of admission due to high intensity of service, high risk for further deterioration and high frequency of surveillance required.Allie Dimmer MD [facility]  How to contact the [facility] Attending or Consulting provider [place] or covering provider during after hours [place], for this patient?   1. Check the care team in [facility] and look for a) attending/consulting [facility] provider listed and b) the [facility] team listed 2. Log into  [email or web address] and use [place]'s universal password to access. If you do not have the password, please contact the hospital operator. 3. Locate the [facility] provider you are looking for under [facility] and page to a number that you can be directly reached. 4. If you still have difficulty reaching the provider, please page the [facility] (Director on Call) for the Hospitalists listed on amion for assistance.  09/27/2020, 4:32 PM

## 2020-09-27 NOTE — ED Provider Notes (Signed)
[facility] EMERGENCY DEPARTMENT Provider Note   CSN: [id number] Arrival date & time: 09/27/20  1123     History Chief Complaint  Patient presents with  . Generalized Body Aches    Patrick Anthony is a 50 y.o. male.  50 y.o male with a PMH of CKD on dialysis no urinary output (MWF), ETOH, HTN, CHF presents to the ED with a chief complaint of overall weakness from dialysis center.  Symptoms have been ongoing for the past week, he reports feeling overall weak, diarrhea, abdominal pain, feeling overall fatigued.  He reports he felt "so weak that I could not go to dialysis last Friday " as he needed to lay in bed.Subjective fever, arrived with an oral tempeture of 99.4. Last dialyzed today with completion of treatment. No prior Covid 19 immunizations due to religion beliefs. No medication attempted for improvement in symptoms.  Also reports an episode the last couple days of eating Cheerios, states "the use did not have any flavor ", he then proceeded to attempt to eat due to chips, there was lack of taste to these.  Denies any blood in his stool, does endorses some abdominal pain along with distention.  There is been no nausea, does endorse shortness of breath, exacerbated with movement.  No cough, vomiting, leg swelling, or other complaints.  The history is provided by the patient.       Past Medical History:  Diagnosis Date  . Chronic kidney disease    patient on dialysis tues-thurs-sat  . Cirrhosis of liver ([place])   . Drug addiction ([place])   . ETOH abuse   . Hepatitis   . Hypertension   . Renal disorder renal failure  . Sleep apnea   . Systolic and diastolic CHF, 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[place]) 04/19/2013    Patient Active Problem List   Diagnosis Date Noted  . Chest pain 05/03/2017  . End stage renal disease ([place]) 10/17/2013  . Anemia in chronic kidney disease 08/30/2013  . Hepatitis C 08/30/2013  . ESRD on dialysis ([place]) 08/30/2013  . Hypertensive crisis 08/28/2013   . Hypertensive emergency 04/20/2013  . Chronic combined systolic and diastolic heart failure ([place]) 04/19/2013  . HTN (hypertension) 04/19/2013    Past Surgical History:  Procedure Laterality Date  . AV FISTULA PLACEMENT Right   . AV FISTULA PLACEMENT Left 09/03/2013   Procedure: ARTERIOVENOUS (AV) FISTULA CREATION - LEFT BRACHIOCEPHALIC;  Surgeon: Conrad McBee, MD;  Location: [facility] OR;  Service: Vascular;  Laterality: Left;  . AV FISTULA PLACEMENT Left 10/30/2019   Procedure: INSERTION OF ARTERIOVENOUS (AV) GORE-TEX GRAFT ARM;  Surgeon: Rosetta Posner, MD;  Location: [facility];  Service: Vascular;  Laterality: Left;  . INSERTION OF DIALYSIS CATHETER N/A 08/29/2013   Procedure: INSERTION OF DIALYSIS CATHETER;  Surgeon: Mal Misty, MD;  Location: [facility];  Service: Vascular;  Laterality: N/A;  . INSERTION OF DIALYSIS CATHETER Left 10/30/2019   Procedure: INSERTION OF DIALYSIS CATHETER LEFT INTERNAL JUGULAR;  Surgeon: Rosetta Posner, MD;  Location: [facility];  Service: Vascular;  Laterality: Left;  . PERIPHERAL VASCULAR CATHETERIZATION N/A 01/06/2015   Procedure: A/V Shuntogram/Fistulagram;  Surgeon: Katha Cabal, MD;  Location: [facility] CV LAB;  Service: Cardiovascular;  Laterality: N/A;  . PERIPHERAL VASCULAR CATHETERIZATION Left 01/06/2015   Procedure: A/V Shunt Intervention;  Surgeon: Katha Cabal, MD;  Location: [facility] CV LAB;  Service: Cardiovascular;  Laterality: Left;       Family History  Problem Relation  Age of Onset  . Hypertension Mother   . Cancer Mother        breast    Social History   Tobacco Use  . Smoking status: Light Tobacco Smoker    Packs/day: 0.20    Types: Cigarettes  . Smokeless tobacco: Never Used  . Tobacco comment: couple puffs per day  Vaping Use  . Vaping Use: Never used  Substance Use Topics  . Alcohol use: No  . Drug use: No    Comment: Quit    Home Medications Prior to Admission medications   Medication Sig Start Date End Date  Taking? Authorizing Provider  amLODipine (NORVASC) 5 MG tablet Take 1 tablet (5 mg total) by mouth daily. 10/28/19   Sueanne Margarita, MD  AURYXIA 1 GM 210 MG(Fe) tablet Take 420 mg by mouth 3 (three) times daily. 07/20/20   [provider]  calcium acetate (PHOSLO) 667 MG capsule Take 3 capsules (2,001 mg total) by mouth 3 (three) times daily with meals. Patient taking differently: Take 667-2,001 mg by mouth See admin instructions. Take 3 capsules (2001 mg) by mouth with each meal & take 1 capsule (667 mg) by mouth with each snack 09/04/13   Ivor Costa, MD  cloNIDine (CATAPRES) 0.3 MG tablet Take 0.3 mg by mouth in the morning, at noon, and at bedtime.    [provider]  Darbepoetin Alfa (ARANESP, ALBUMIN FREE, IJ) Darbepoetin Alfa (Aranesp) 07/05/20 07/04/21  [provider]  ferric citrate (AURYXIA) 1 GM 210 MG(Fe) tablet Take by mouth. 01/05/20   [provider]  gabapentin (NEURONTIN) 100 MG capsule Take 200 mg by mouth at bedtime.    [provider]  heparin 1000 unit/mL SOLN injection Heparin Sodium (Porcine) 1,000 Units/mL Systemic 04/23/20 04/22/21  [provider]  hydrALAZINE (APRESOLINE) 50 MG tablet Take 75 mg by mouth in the morning, at noon, and at bedtime.  10/31/18   [provider]  metoprolol tartrate (LOPRESSOR) 25 MG tablet Take 75 mg by mouth 2 (two) times daily.  04/12/16   [provider]  nitroGLYCERIN (NITROSTAT) 0.4 MG SL tablet Place 0.4 mg under the tongue every 5 (five) minutes x 3 doses as needed for chest pain.     [provider]  oxyCODONE-acetaminophen (PERCOCET) 7.5-325 MG tablet Take 1 tablet by mouth every 4 (four) hours as needed for severe pain. 10/30/19 10/29/20  Baglia, Corrina, PA-C  SENSIPAR 30 MG tablet Take 30 mg by mouth daily. 09/15/19   [provider]  VIAGRA 50 MG tablet Take 50 mg by mouth daily as needed for erectile dysfunction.  09/24/19   [provider]     Allergies    Patient has no known allergies.  Review of Systems   Review of Systems  Constitutional: Positive for chills, fatigue and fever.  HENT: Negative for sore throat.   Respiratory: Positive for shortness of breath.   Cardiovascular: Negative for chest pain.  Gastrointestinal: Positive for abdominal pain, diarrhea and vomiting. Negative for nausea.  Genitourinary: Negative for flank pain.  Musculoskeletal: Negative for back pain.  Skin: Negative for pallor and wound.  Neurological: Negative for light-headedness and headaches.  All other systems reviewed and are negative.   Physical Exam Updated Vital Signs BP (!) 161/109   Pulse (!) 143   Temp (!) 103.2 F (39.6 C) (Rectal)   Resp (!) 37   Ht 6' (1.829 m)   Wt 83.9 kg   SpO2 98%   BMI 25.09  kg/m   Physical Exam Vitals and nursing note reviewed.  Constitutional:      Appearance: Normal appearance. He is ill-appearing.  HENT:     Head: Normocephalic and atraumatic.     Nose: Nose normal.     Mouth/Throat:     Mouth: Mucous membranes are dry.  Cardiovascular:     Rate and Rhythm: Tachycardia present.  Pulmonary:     Effort: Tachypnea and retractions present.     Breath sounds: Decreased breath sounds present.  Abdominal:     General: There is distension.     Tenderness: There is abdominal tenderness. There is no right CVA tenderness or left CVA tenderness.  Musculoskeletal:     Cervical back: Normal range of motion and neck supple.  Skin:    General: Skin is warm and dry.  Neurological:     Mental Status: He is alert and oriented to person, place, and time.     ED Results / Procedures / Treatments   Labs (all labs ordered are listed, but only abnormal results are displayed) Labs Reviewed  BASIC METABOLIC PANEL - Abnormal; Notable for the following components:      Result Value   Chloride 92 (*)    BUN 45 (*)    Creatinine, Ser 9.82 (*)    Calcium 8.8 (*)    GFR, Estimated 6 (*)    Anion gap  20 (*)    All other components within normal limits  CBC - Abnormal; Notable for the following components:   WBC 23.2 (*)    RBC 3.96 (*)    Hemoglobin 11.0 (*)    HCT 32.6 (*)    Platelets 83 (*)    All other components within normal limits  PROTIME-INR - Abnormal; Notable for the following components:   Prothrombin Time 16.9 (*)    INR 1.4 (*)    All other components within normal limits  CULTURE, BLOOD (ROUTINE X 2)  CULTURE, BLOOD (ROUTINE X 2)  URINE CULTURE  RESP PANEL BY RT-PCR (FLU A&B, COVID) ARPGX2  APTT  LACTIC ACID, PLASMA  LACTIC ACID, PLASMA  COMPREHENSIVE METABOLIC PANEL  URINALYSIS, ROUTINE W REFLEX MICROSCOPIC    EKG EKG Interpretation  Date/Time:  Monday September 27 2020 11:44:25 EST Ventricular Rate:  143 PR Interval:  158 QRS Duration: 76 QT Interval:  248 QTC Calculation: 382 R Axis:   61 Text Interpretation: Sinus tachycardia Right atrial enlargement Minimal voltage criteria for LVH, may be normal variant ( Sokolow-Lyon ) T wave abnormality, consider inferior ischemia Abnormal ECG When compared to prior, faster rate and sharper t waves. No STEMI Confirmed by Antony Blackbird [phone]) on 09/27/2020 1:49:23 PM   Radiology DG Chest Port 1 View  Result Date: 09/27/2020 CLINICAL DATA:  Questionable sepsis evaluate for abnormality. Presenting from dialysis with acute complaint of body aches, chills and shortness of breath EXAM: PORTABLE CHEST 1 VIEW COMPARISON:  Shoulder radiograph September 17, 2020 and chest radiograph August 29, 2013 FINDINGS: Cardiomegaly. Low lung volumes. Streaky left basilar opacities. No significant pleural effusion or visible pneumothorax. Left vascular stent. Similar appearance of the mass like calcific density over the right shoulder. There is hazy calcification along the left shoulder. IMPRESSION: 1. Low lung volumes with streaky left basilar opacities which could represent atelectasis or infection. Electronically Signed   By: Dahlia Bailiff MD   On: 09/27/2020 14:59    Procedures .Critical Care Performed by: Janeece Fitting, PA-C Authorized by: Janeece Fitting, PA-C   Critical care  provider statement:    Critical care time (minutes):  35   Critical care start time:  09/27/2020 2:30 PM   Critical care end time:  09/27/2020 3:00 PM   Critical care time was exclusive of:  Separately billable procedures and treating other patients   Critical care was necessary to treat or prevent imminent or life-threatening deterioration of the following conditions:  Sepsis   Critical care was time spent personally by me on the following activities:  Blood draw for specimens, development of treatment plan with patient or surrogate, discussions with consultants, evaluation of patient's response to treatment, examination of patient, obtaining history from patient or surrogate, ordering and performing treatments and interventions, ordering and review of laboratory studies, ordering and review of radiographic studies, pulse oximetry, re-evaluation of patient's condition and review of old charts     Medications Ordered in ED Medications  0.9 %  sodium chloride infusion ( Intravenous New Bag/Given 09/27/20 1456)  ceFEPIme (MAXIPIME) 2 g in sodium chloride 0.9 % 100 mL IVPB (2 g Intravenous New Bag/Given 09/27/20 1456)  metroNIDAZOLE (FLAGYL) IVPB 500 mg (has no administration in time range)  vancomycin (VANCOREADY) IVPB 2000 mg/400 mL (has no administration in time range)  vancomycin (VANCOCIN) IVPB 1000 mg/200 mL premix (has no administration in time range)  ceFEPIme (MAXIPIME) 2 g in sodium chloride 0.9 % 100 mL IVPB (has no administration in time range)  acetaminophen (TYLENOL) tablet 650 mg (has no administration in time range)    ED Course  I have reviewed the triage vital signs and the nursing notes.  Pertinent labs & imaging results that were available during my care of the patient were reviewed by me and considered in my medical decision  making (see chart for details).  Clinical Course as of 09/27/20 1507  Mon Sep 27, 2020  1436 WBC(!): 23.2 [JS]  1436 Pulse Rate(!): 143 [JS]  1436 Temp: 99.4 F (37.4 C) [JS]    Clinical Course User Index [JS] Janeece Fitting, PA-C   MDM Rules/Calculators/A&P     2:34 PM patient arrived the ED from dialysis center after completing his treatment for overall weakness, fatigue, fever.  Patient was evaluated by me after 3 hours the waiting room, labs were obtained remarkable for CBC with a leukocytosis of 23.2, hemoglobin slightly decreased, within his baseline.  BMP without any electrolyte abnormality, creatinine level is 9.8, this is improved from his prior levels along with his baseline.  Code sepsis was activated, rectal temp is currently pending however oral temperature was 99.4.  Heart remarkable for tachycardia in the 143's, he is tachypneic on my evaluation along with hypertensive.  Reports he has not been able to tolerate any food or liquids.  Based on patient's vital signs along with white count code sepsis was activated.  Remaining labs such as lactate, blood cultures have been ordered.  He was started on broad-spectrum antibiotics.  Suspicion for respiratory illness such as GI, based on his symptoms involving shortness of breath, tachypnea,   The rotation of his labs revealed a CBC with a leukocytosis 23.2, hemoglobin slightly decreased.  BMP without any electrolyte normality, creatinine level is improved from his baseline.  He is currently pending the rest of his labs.  He was moving around the bed, noted by nursing staff with Dr. Sherry Ruffing to be satting at 81% therefore he was placed on 4 L nasal cannula.  Oxygen brought back up to 95%.  Active temperature checked was 103.2, he is currently receiving broad-spectrum antibiotics.  Patient care signed out to incoming provider pending labs and admission.    Portions of this note were generated with [employer]. Dictation errors  may occur despite best attempts at proofreading.  Final Clinical Impression(s) / ED Diagnoses Final diagnoses:  Sepsis, due to unspecified organism, unspecified whether acute organ dysfunction present [facility])  Shortness of breath    Rx / DC Orders ED Discharge Orders    None       Janeece Fitting, PA-C 09/27/20 1507    Tegeler, Gwenyth Allegra, MD 09/28/20 1440

## 2020-09-27 NOTE — ED Notes (Signed)
Iv team at the bedside 

## 2020-09-27 NOTE — ED Notes (Signed)
Patient transported to CT 

## 2020-09-27 NOTE — Progress Notes (Signed)
Pharmacy Antibiotic Note  Patrick Anthony is a 50 y.o. male admitted on 09/27/2020 with sepsis.  Patient presents from dialysis center with body aches, chills, and shortness of breath.  HD schedule MWF.  Pharmacy has been consulted for vancomycin and cefepime dosing.  Tm 99, WBC 23.2   Plan: Vancomycin '2000mg'$  IV x1, followed by Vancomycin '1000mg'$  IV qHD  Cefepime 2g IV x1, followed by Cefepime 2g IV qHD F/u if HD schedule remains MWF F/u cultures, clinical improvement, vanc levels as indicated  Height: 6' (182.9 cm) Weight: 83.9 kg (185 lb) IBW/kg (Calculated) : 77.6  Temp (24hrs), Avg:99.4 F (37.4 C), Min:99.4 F (37.4 C), Max:99.4 F (37.4 C)  Recent Labs  Lab 09/27/20 1125  WBC 23.2*  CREATININE 9.82*    Estimated Creatinine Clearance: 10 mL/min (A) (by C-G formula based on SCr of 9.82 mg/dL (H)).    No Known Allergies  Antimicrobials this admission: Vancomycin 2/21 >>  Cefepime 2/21 >>  Metronidazole 2/21 >>   Microbiology results: 2/21 BCx: ordered 2/21 UCx: ordered   Thank you for allowing pharmacy to be a part of this patient's care.  Dimple Nanas, PharmD PGY-1 Acute Care Pharmacy Resident Office: [phone] 09/27/2020 2:41 PM

## 2020-09-28 ENCOUNTER — Other Ambulatory Visit: Payer: Self-pay

## 2020-09-28 ENCOUNTER — Inpatient Hospital Stay (HOSPITAL_COMMUNITY): Payer: Medicaid Other

## 2020-09-28 ENCOUNTER — Encounter (HOSPITAL_COMMUNITY): Payer: Self-pay | Admitting: Internal Medicine

## 2020-09-28 DIAGNOSIS — I5032 Chronic diastolic (congestive) heart failure: Secondary | ICD-10-CM | POA: Diagnosis not present

## 2020-09-28 DIAGNOSIS — I6389 Other cerebral infarction: Secondary | ICD-10-CM | POA: Diagnosis not present

## 2020-09-28 DIAGNOSIS — R7881 Bacteremia: Secondary | ICD-10-CM | POA: Diagnosis not present

## 2020-09-28 DIAGNOSIS — I639 Cerebral infarction, unspecified: Secondary | ICD-10-CM

## 2020-09-28 DIAGNOSIS — N186 End stage renal disease: Secondary | ICD-10-CM

## 2020-09-28 DIAGNOSIS — E872 Acidosis: Secondary | ICD-10-CM | POA: Diagnosis not present

## 2020-09-28 DIAGNOSIS — R0602 Shortness of breath: Secondary | ICD-10-CM | POA: Diagnosis not present

## 2020-09-28 DIAGNOSIS — I132 Hypertensive heart and chronic kidney disease with heart failure and with stage 5 chronic kidney disease, or end stage renal disease: Secondary | ICD-10-CM | POA: Diagnosis not present

## 2020-09-28 DIAGNOSIS — Z992 Dependence on renal dialysis: Secondary | ICD-10-CM

## 2020-09-28 DIAGNOSIS — B9562 Methicillin resistant Staphylococcus aureus infection as the cause of diseases classified elsewhere: Secondary | ICD-10-CM | POA: Diagnosis not present

## 2020-09-28 DIAGNOSIS — I63119 Cerebral infarction due to embolism of unspecified vertebral artery: Secondary | ICD-10-CM

## 2020-09-28 DIAGNOSIS — J189 Pneumonia, unspecified organism: Secondary | ICD-10-CM | POA: Diagnosis not present

## 2020-09-28 DIAGNOSIS — A419 Sepsis, unspecified organism: Secondary | ICD-10-CM | POA: Diagnosis not present

## 2020-09-28 DIAGNOSIS — I150 Renovascular hypertension: Secondary | ICD-10-CM | POA: Diagnosis not present

## 2020-09-28 DIAGNOSIS — A4102 Sepsis due to Methicillin resistant Staphylococcus aureus: Principal | ICD-10-CM

## 2020-09-28 DIAGNOSIS — N2581 Secondary hyperparathyroidism of renal origin: Secondary | ICD-10-CM | POA: Diagnosis not present

## 2020-09-28 DIAGNOSIS — R29818 Other symptoms and signs involving the nervous system: Secondary | ICD-10-CM | POA: Diagnosis not present

## 2020-09-28 DIAGNOSIS — R768 Other specified abnormal immunological findings in serum: Secondary | ICD-10-CM

## 2020-09-28 DIAGNOSIS — D631 Anemia in chronic kidney disease: Secondary | ICD-10-CM | POA: Diagnosis not present

## 2020-09-28 DIAGNOSIS — R4182 Altered mental status, unspecified: Secondary | ICD-10-CM | POA: Diagnosis not present

## 2020-09-28 DIAGNOSIS — Q612 Polycystic kidney, adult type: Secondary | ICD-10-CM | POA: Diagnosis not present

## 2020-09-28 DIAGNOSIS — R531 Weakness: Secondary | ICD-10-CM | POA: Diagnosis not present

## 2020-09-28 LAB — BASIC METABOLIC PANEL
Anion gap: 17 — ABNORMAL HIGH (ref 5–15)
BUN: 76 mg/dL — ABNORMAL HIGH (ref 6–20)
CO2: 22 mmol/L (ref 22–32)
Calcium: 8 mg/dL — ABNORMAL LOW (ref 8.9–10.3)
Chloride: 96 mmol/L — ABNORMAL LOW (ref 98–111)
Creatinine, Ser: 11.1 mg/dL — ABNORMAL HIGH (ref 0.61–1.24)
GFR, Estimated: 5 mL/min — ABNORMAL LOW (ref >60)
Glucose, Bld: 119 mg/dL — ABNORMAL HIGH (ref 70–99)
Potassium: 5 mmol/L (ref 3.5–5.1)
Sodium: 135 mmol/L (ref 135–145)

## 2020-09-28 LAB — CBC
HCT: 28.1 % — ABNORMAL LOW (ref 39.0–52.0)
Hemoglobin: 9.3 g/dL — ABNORMAL LOW (ref 13.0–17.0)
MCH: 27 pg (ref 26.0–34.0)
MCHC: 33.1 g/dL (ref 30.0–36.0)
MCV: 81.7 fL (ref 80.0–100.0)
Platelets: 88 10*3/uL — ABNORMAL LOW (ref 150–400)
RBC: 3.44 MIL/uL — ABNORMAL LOW (ref 4.22–5.81)
RDW: 14.8 % (ref 11.5–15.5)
WBC: 29.6 10*3/uL — ABNORMAL HIGH (ref 4.0–10.5)
nRBC: 0 % (ref 0.0–0.2)

## 2020-09-28 LAB — BLOOD CULTURE ID PANEL (REFLEXED) - BCID2

## 2020-09-28 LAB — HEMOGLOBIN A1C
Hgb A1c MFr Bld: 5.4 % (ref 4.8–5.6)
Mean Plasma Glucose: 108.28 mg/dL

## 2020-09-28 LAB — LACTIC ACID, PLASMA: Lactic Acid, Venous: 1.1 mmol/L (ref 0.5–1.9)

## 2020-09-28 LAB — LIPID PANEL
Cholesterol: 116 mg/dL (ref 0–200)
HDL: <10 mg/dL — ABNORMAL LOW (ref >40)
Triglycerides: 307 mg/dL — ABNORMAL HIGH (ref <150)
VLDL: 61 mg/dL — ABNORMAL HIGH (ref 0–40)

## 2020-09-28 LAB — AMMONIA: Ammonia: 27 umol/L (ref 9–35)

## 2020-09-28 LAB — GLUCOSE, CAPILLARY
Glucose-Capillary: 92 mg/dL (ref 70–99)
Glucose-Capillary: 103 mg/dL — ABNORMAL HIGH (ref 70–99)

## 2020-09-28 LAB — ECHOCARDIOGRAM COMPLETE BUBBLE STUDY
Area-P 1/2: 5.13 cm2
S' Lateral: 3.2 cm

## 2020-09-28 LAB — HIV ANTIBODY (ROUTINE TESTING W REFLEX): HIV Screen 4th Generation wRfx: NONREACTIVE

## 2020-09-28 IMAGING — MR MR HEAD W/O CM
9 of 12 series · 29 of 48 positions shown · non-contrast
Comparison: Noncontrast head CT performed earlier today [DATE].

CLINICAL DATA: Neuro deficit, acute, stroke suspected.

EXAM:
MRI HEAD WITHOUT CONTRAST
MRA HEAD WITHOUT CONTRAST
TECHNIQUE: Multiplanar, multiecho pulse sequences of the brain and surrounding
structures were obtained without intravenous contrast. Angiographic
images of the head were obtained using MRA technique without
contrast.

[Series 3: DWI · axial · 3.0mm · 1.09mm/px · z∈[+12,+146]mm · 6 of 92 slices shown (1 of 4)]
[im 1/92]
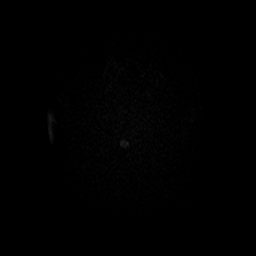
[im 19/92]
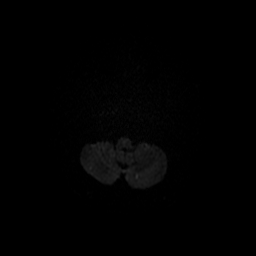
[im 37/92]
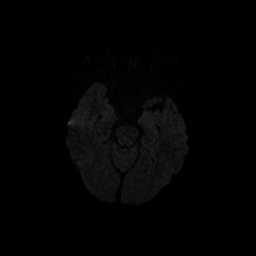
[im 55/92]
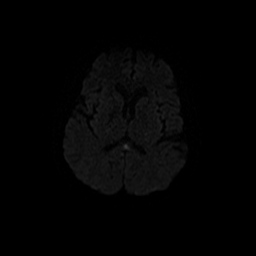
[im 73/92]
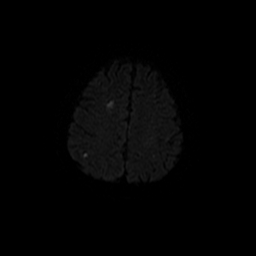
[im 92/92]
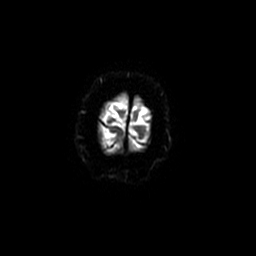

[Series 5: DWI · coronal · 5.0mm · 1.09mm/px · 5 of 70 slices shown (2 of 4)]
[im 1/70]
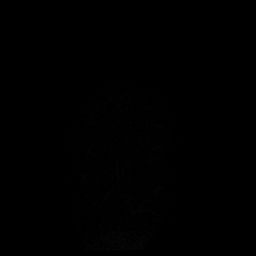
[im 18/70]
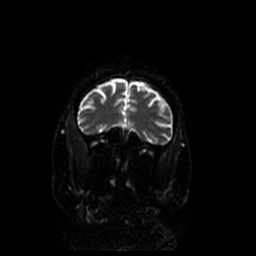
[im 35/70]
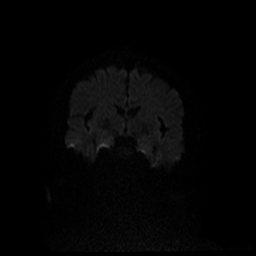
[im 52/70]
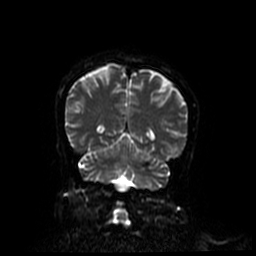
[im 70/70]
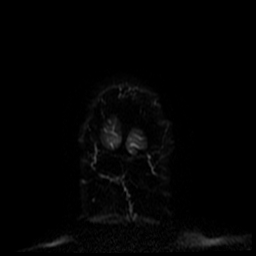

[Series 6: T2 · axial · 5.0mm · 0.43mm/px · z∈[+10,+146]mm · 2 of 24 slices shown (1 of 2)]
[im 1/24]
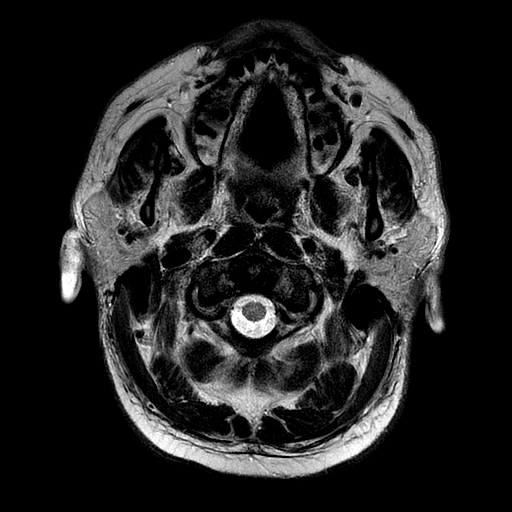
[im 24/24]
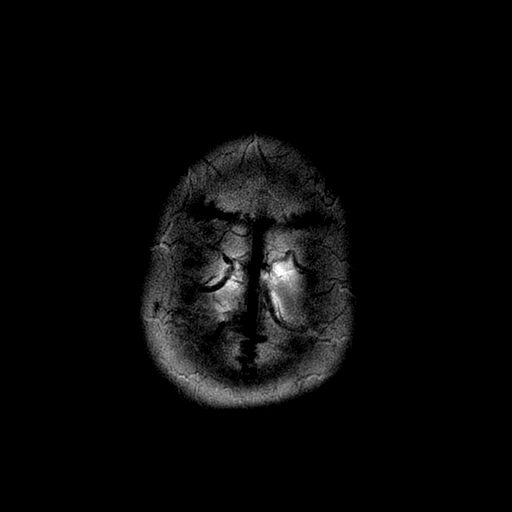

[Series 7: FLAIR · axial · 3.0mm · 0.43mm/px · z∈[+10,+146]mm · 2 of 24 slices shown]
[im 1/24]
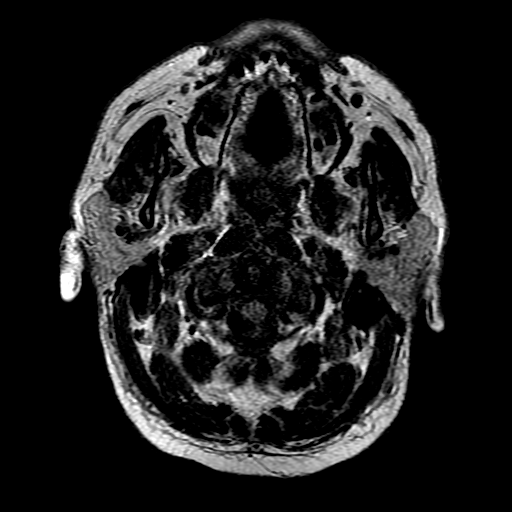
[im 24/24]
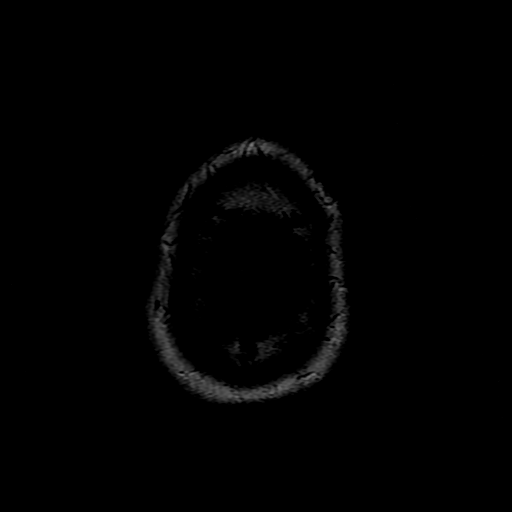

[Series 9: T1 · sagittal · 5.0mm · 0.47mm/px · 2 of 25 slices shown (1 of 2)]
[im 1/25]
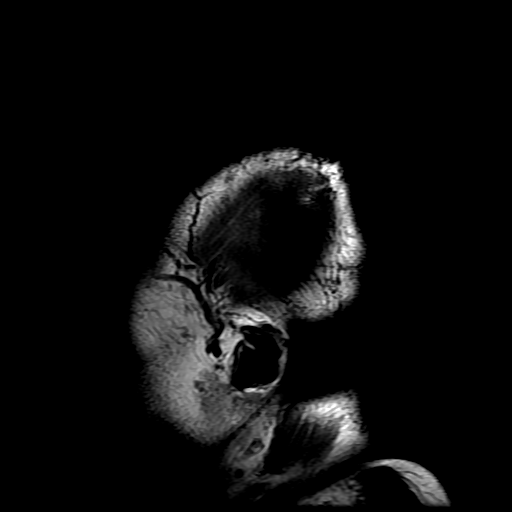
[im 25/25]
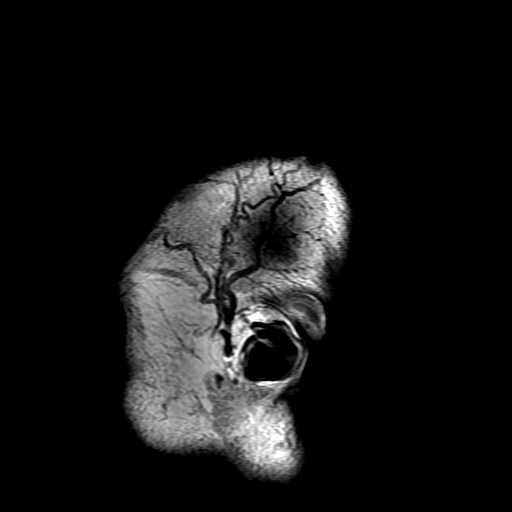

[Series 10: T1 · axial · 3.0mm · 0.47mm/px · z∈[+21,+134]mm · 5 of 96 slices shown (2 of 2)]
[im 1/96]
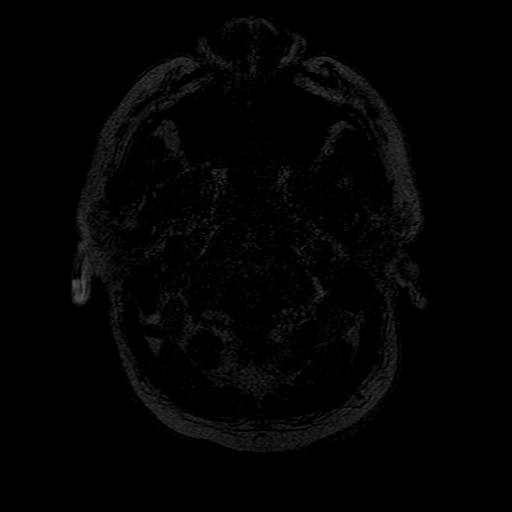
[im 20/96]
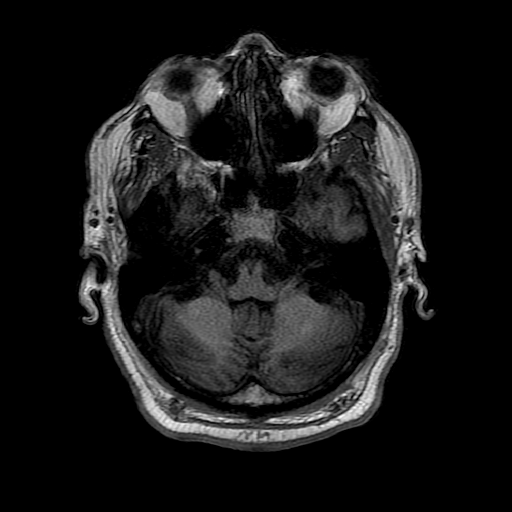
[im 39/96]
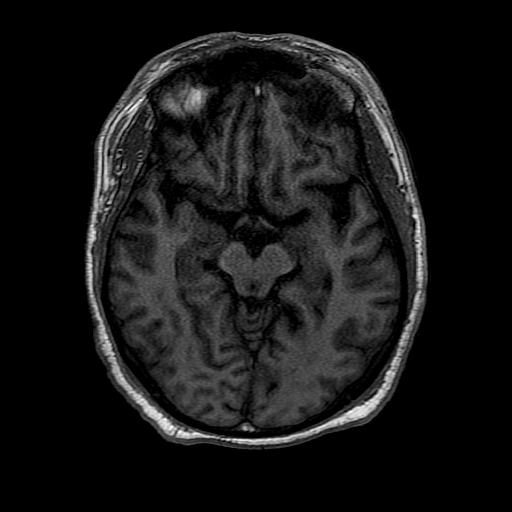
[im 58/96]
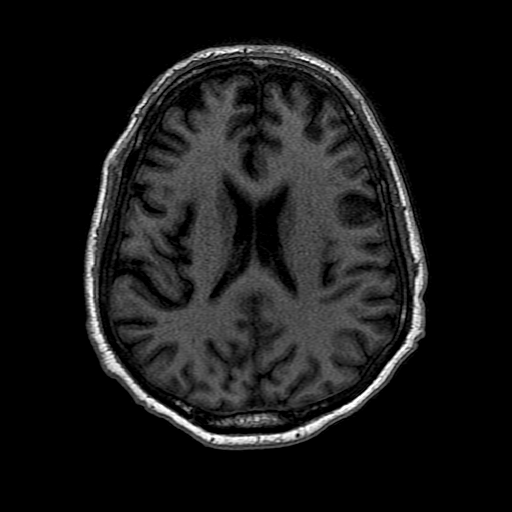
[im 77/96]
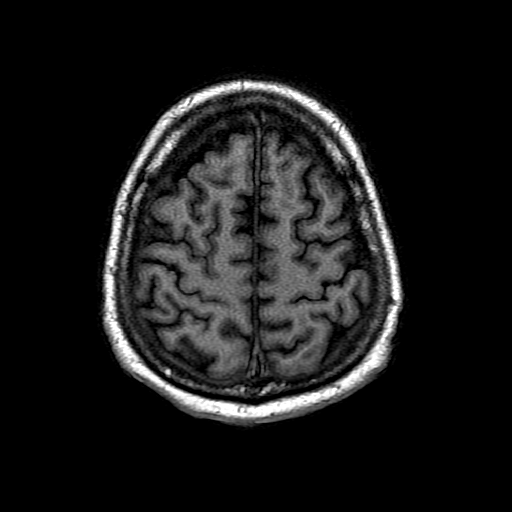

[Series 11: T2 · coronal · 5.0mm · 0.43mm/px · 2 of 30 slices shown (2 of 2)]
[im 1/30]
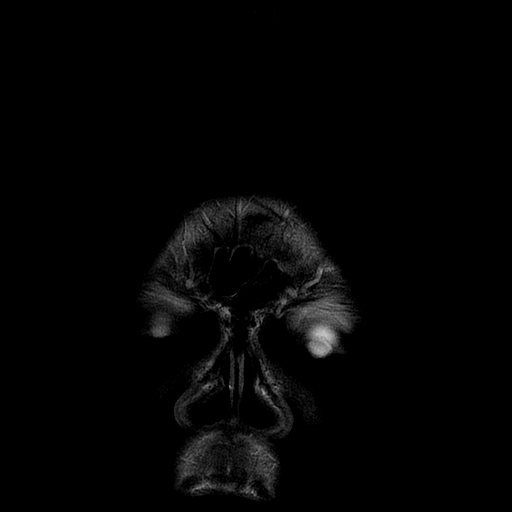
[im 30/30]
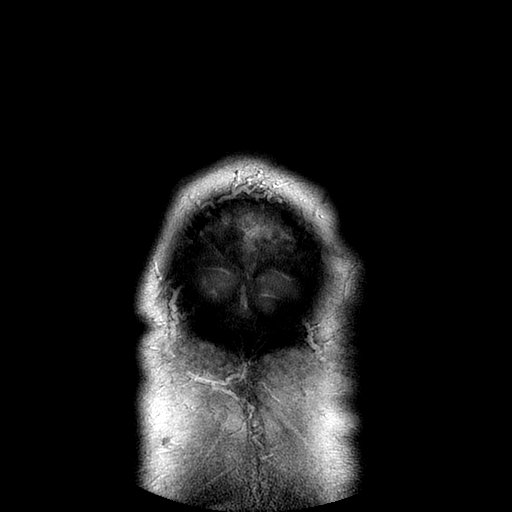

[Series 300: DWI · axial · 3.0mm · 1.09mm/px · z∈[+12,+146]mm · 3 of 46 slices shown (3 of 4)]
[im 1/46]
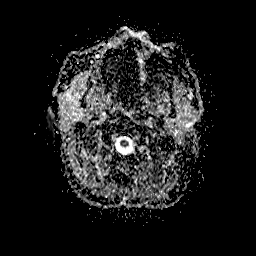
[im 23/46]
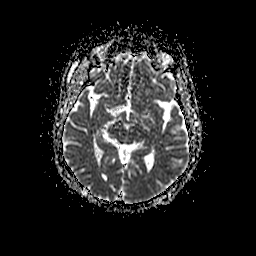
[im 46/46]
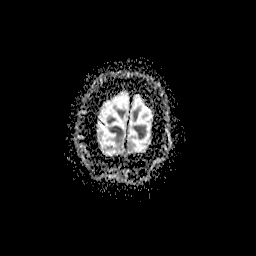

[Series 500: DWI · coronal · 5.0mm · 1.09mm/px · 2 of 35 slices shown (4 of 4)]
[im 1/35]
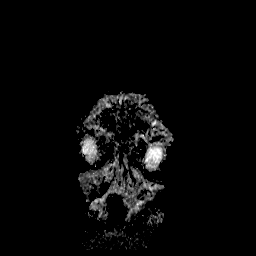
[im 35/35]
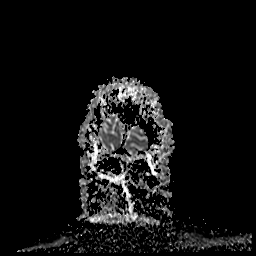

[29 of 48 positions shown; findings below may reference images not displayed]

FINDINGS: MRI HEAD FINDINGS

Brain:

Mild cerebral and cerebellar atrophy.

There are scattered small acute cortical and subcortical infarcts
within the bilateral frontal, bilateral parietal, bilateral
occipital and left temporal lobes. Small acute infarcts are also
present within the midline callosum splenium, right caudate nucleus
and bilateral cerebellar hemispheres. The largest acute infarct is
present cortical/subcortical right frontal lobe and measures 2.3 x
0.5 cm (series 5, image 21).

Foci of chronic hemorrhage within the bilateral cerebellar
hemispheres and cerebellar vermis. A few small scattered
supratentorial chronic microhemorrhages are also present.

No evidence of intracranial mass.

No extra-axial fluid collection.

No midline shift.

Vascular: Expected proximal arterial flow voids.

Skull and upper cervical spine: No focal marrow lesion.

Sinuses/Orbits: Visualized orbits show no acute finding. Mild
bilateral ethmoid sinus mucosal thickening.

MRA HEAD FINDINGS

The intracranial internal carotid arteries are patent. The M1 middle
cerebral arteries are patent. No M2 proximal branch occlusion or
high-grade proximal stenosis is identified. The anterior cerebral
arteries are patent.

The intracranial vertebral arteries are patent. The basilar artery
is patent. The posterior cerebral arteries are patent. Posterior
communicating arteries are present bilaterally.

2 mm infundibulum at the origin of the left posterior communicating
artery.

MRI head Impression #1 will be called to the ordering clinician or
representative by the Radiologist Assistant, and communication
documented in the PACS or [REDACTED].
IMPRESSION: MRI head:

1. Scattered small acute infarcts within the bilateral cerebral
hemispheres, right basal ganglia, callosal splenium and bilateral
cerebellar hemispheres. Multiple vascular territories are involved
and findings are highly suspicious for an embolic process.
2. Foci of chronic hemorrhage within the bilateral cerebellar
hemispheres and cerebellar vermis. A few scattered supratentorial
chronic microhemorrhages are also present.
3. Mild generalized atrophy of the brain.

MRA head:

No intracranial large vessel occlusion or proximal high-grade
arterial stenosis.

## 2020-09-28 IMAGING — CT CT HEAD W/O CM
3 series · 14 of 47 positions shown, 16 images · non-contrast
Comparison: No pertinent prior exams available for comparison.

CLINICAL DATA: Delirium. Additional history provided by Dr. BORGEN:
Patient perseverating.

EXAM:
CT HEAD WITHOUT CONTRAST
TECHNIQUE: Contiguous axial images were obtained from the base of the skull
through the vertex without intravenous contrast.

[Series 2: head 5.0 h30s · axial · 0.41mm/px · z∈[-111,+14]mm · 8 of 30 slices shown, 10 images]
[im 3/30  brain]
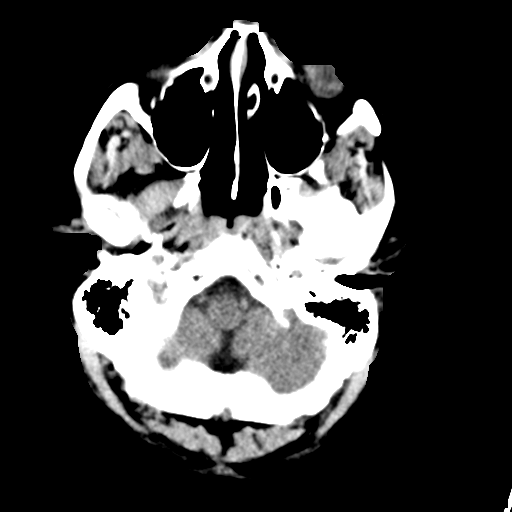
[im 3/30  bone]
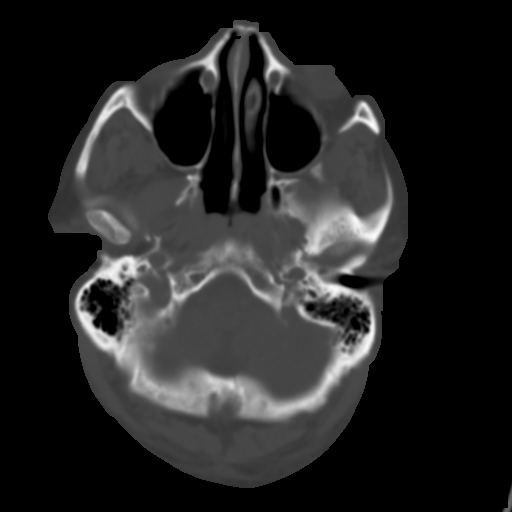
[im 7/30  brain]
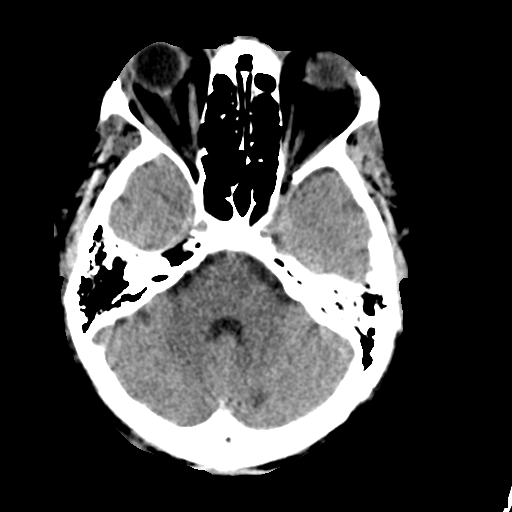
[im 10/30  brain]
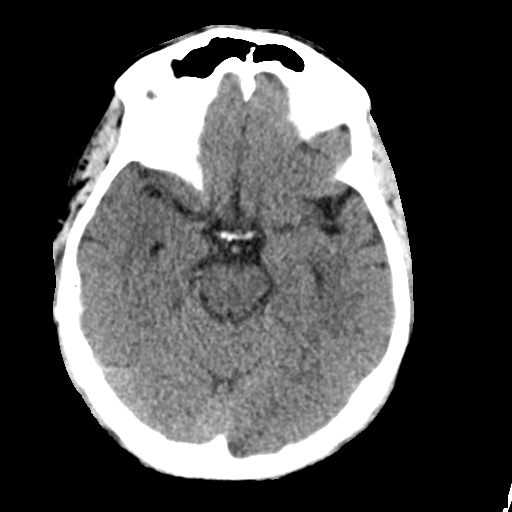
[im 14/30  brain]
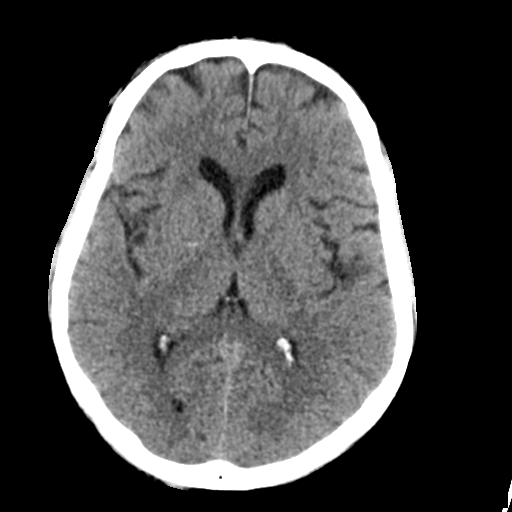
[im 17/30  brain]
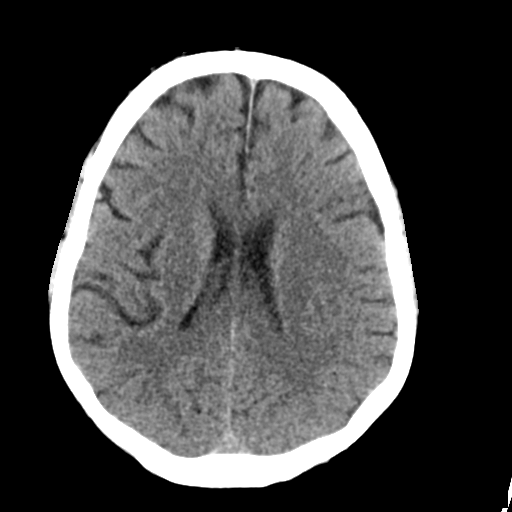
[im 17/30  bone]
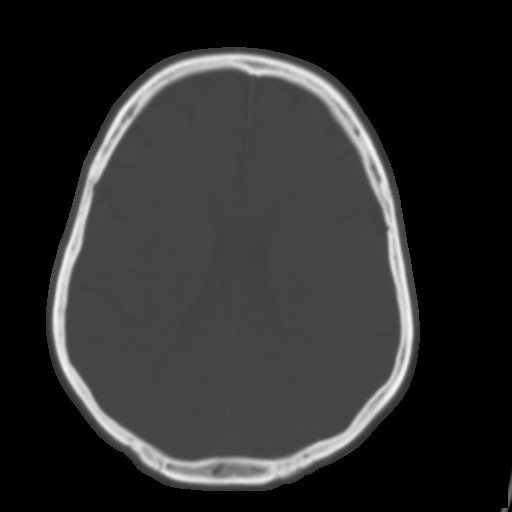
[im 21/30  brain]
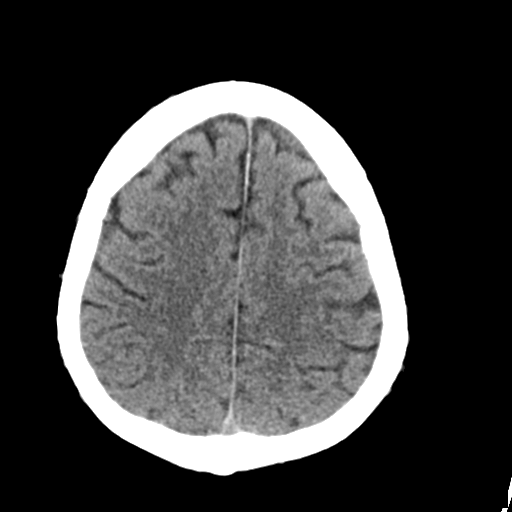
[im 24/30  brain]
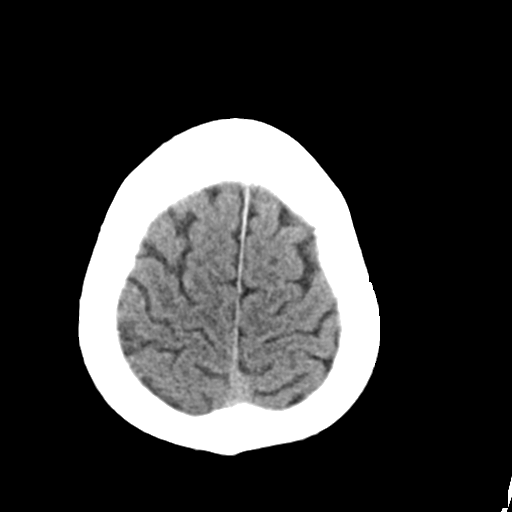
[im 28/30  brain]
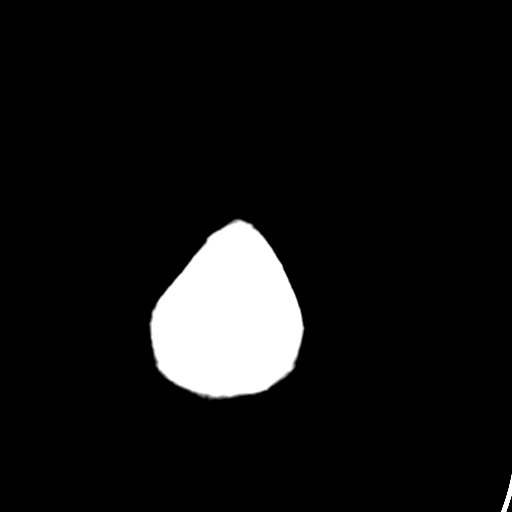

[Series 4: head 3.0 mpr cor · coronal · 0.29mm/px · 3 of 67 slices shown]
[im 23/67  brain]
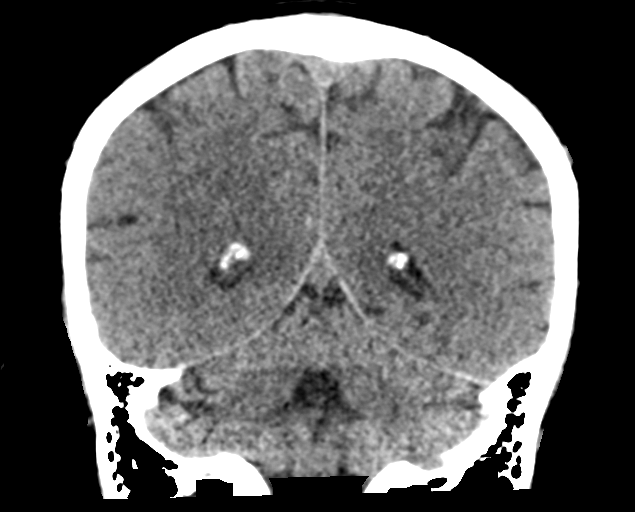
[im 30/67  brain]
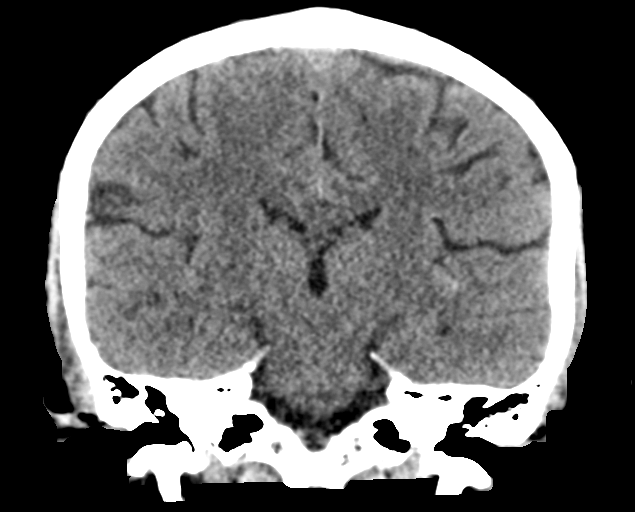
[im 37/67  brain]
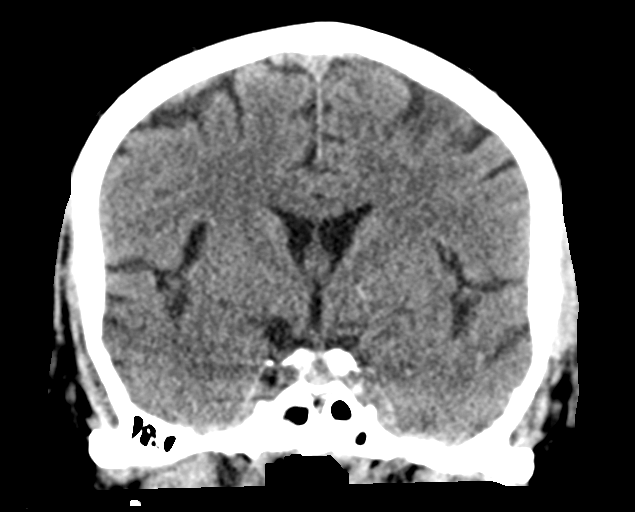

[Series 5: head 3.0 mpr sag · sagittal · 0.29mm/px · 3 of 67 slices shown]
[im 23/67  brain]
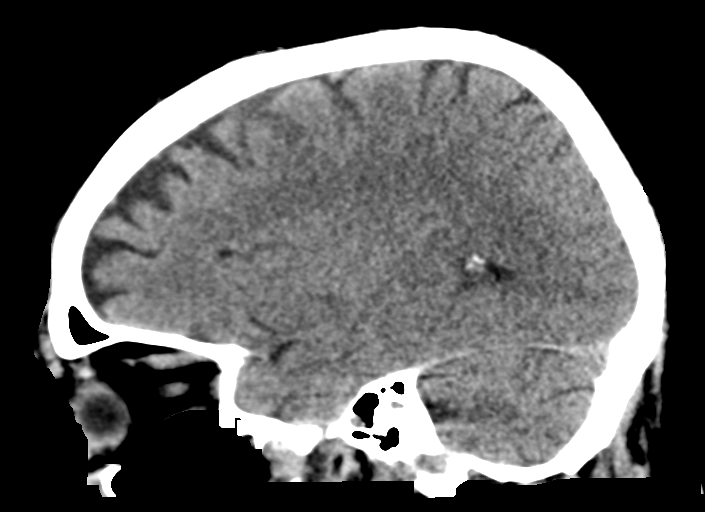
[im 34/67  brain]
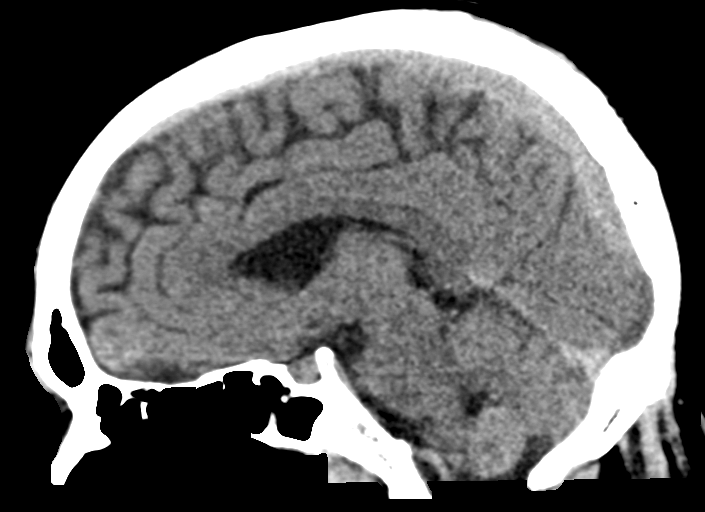
[im 45/67  brain]
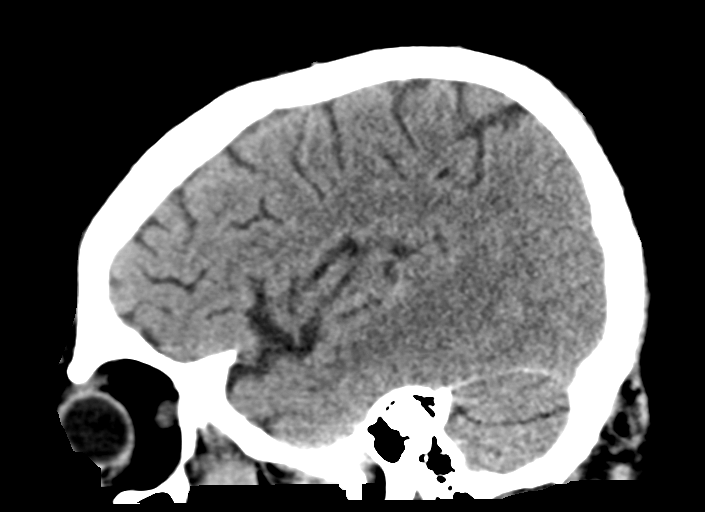

[14 of 47 positions shown; findings below may reference images not displayed]

FINDINGS: Brain:

Mild cerebral and cerebellar atrophy.

Mild patchy and ill-defined hypoattenuation within the cerebral
white matter is nonspecific, but compatible with chronic small
vessel ischemic disease.

10 mm infarct within the left cerebellar hemisphere, possibly acute
(series 2, image 7) (series 4, image 54).

There is no acute intracranial hemorrhage.

No demarcated cortical infarct.

No extra-axial fluid collection.

No evidence of intracranial mass.

No midline shift.

Vascular: No hyperdense vessel.  Atherosclerotic calcifications

Skull: Normal. Negative for fracture or focal lesion.

Sinuses/Orbits: Visualized orbits show no acute finding. Trace
frontal, ethmoid and sphenoid sinus mucosal thickening at the imaged
levels.

ASPECTS ([place]berta Stroke Program Early CT Score)

- Ganglionic level infarction (caudate, lentiform nuclei, internal
capsule, insula, M1-M3 cortex): 7

- Supraganglionic infarction (M4-M6 cortex): 3

Total score (0-10 with 10 being normal): 10

These results were called by telephone at the time of interpretation
on [DATE] at [DATE] to provider Dr. BORGEN, who verbally
acknowledged these results.
IMPRESSION: 10 mm infarct within the medial left cerebellar hemisphere, which
may be acute. Consider brain MRI for further evaluation.

No acute intracranial hemorrhage or acute demarcated cortical
infarct.

Background mild generalized atrophy of the brain and chronic small
vessel ischemic disease.

## 2020-09-28 MED ORDER — LORAZEPAM 2 MG/ML IJ SOLN
2.0000 mg | Freq: Once | INTRAMUSCULAR | Status: AC
  Administered 2020-09-28: 2 mg via INTRAVENOUS

## 2020-09-28 MED ORDER — CHLORHEXIDINE GLUCONATE CLOTH 2 % EX PADS
6.0000 | MEDICATED_PAD | Freq: Every day | CUTANEOUS | Status: DC
  Administered 2020-09-29 – 2020-10-05 (×6): 6 via TOPICAL

## 2020-09-28 MED ORDER — ACETAMINOPHEN 160 MG/5ML PO SOLN: 650.0000 mg | ORAL | Status: DC | PRN

## 2020-09-28 MED ORDER — ACETAMINOPHEN 650 MG RE SUPP
650.0000 mg | RECTAL | Status: DC | PRN
  Administered 2020-09-28 (×2): 650 mg via RECTAL
  Filled 2020-09-28 (×2): qty 1

## 2020-09-28 MED ORDER — ACETAMINOPHEN 325 MG PO TABS
650.0000 mg | ORAL_TABLET | ORAL | Status: DC | PRN
  Administered 2020-10-01: 650 mg via ORAL
  Filled 2020-09-28: qty 2

## 2020-09-28 MED ORDER — LORAZEPAM 2 MG/ML IJ SOLN
INTRAMUSCULAR | Status: AC
  Filled 2020-09-28: qty 1

## 2020-09-28 MED ORDER — SODIUM CHLORIDE 0.9 % IV SOLN: INTRAVENOUS | Status: DC

## 2020-09-28 MED ORDER — STROKE: EARLY STAGES OF RECOVERY BOOK
Freq: Once | Status: AC
  Filled 2020-09-28: qty 1

## 2020-09-28 NOTE — Care Plan (Signed)
Paged regarding continued twitching activity of the right arm and face.  On discussion with Dr. Hortense Ramal, there is no EEG correlate to this movement.  On my evaluation, patient is having some right arm semirhythmic activity but this is suppressible.  When he moves that the left arm he has similar jerkiness of his left arm movements.  He has intermittent eyelid blinking and lip movements, as well as jerking movements of his legs with light noxious stimulation (tickle).   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  Both renal dysfunction (which is being addressed with dialysis) as well as gabapentin can be associated with myoclonus.  We will discontinue gabapentin tonight and neurology will continue to follow.  Lesleigh Noe MD-PhD [facility] [phone]  Available 7 PM to 7 AM, outside of these hours please call Neurologist on call as listed on Amion.

## 2020-09-28 NOTE — Progress Notes (Signed)
PHARMACY - PHYSICIAN COMMUNICATION CRITICAL VALUE ALERT - BLOOD CULTURE IDENTIFICATION (BCID)  Patrick Anthony is an 50 y.o. male who presented to [facility] on 09/27/2020 with a chief complaint of fever and weakness.  Assessment:  Patient growing staph aureus in 1o4 anaerobic blood cultures with MEC A+.   Name of physician (or Provider) Contacted: Lama  Current antibiotics: Vancomycin '1000mg'$  QHD MWF  Cefepime 2G QHD MWF   Changes to prescribed antibiotics recommended:  Continue current antibiotic regimen. Spoke with MD and will continue cefepime for at least 1 more day as patient WBC is elevated and fever overnight. F/u clinical status and HD schedule.   Results for orders placed or performed during the hospital encounter of 09/27/20  Blood Culture ID Panel (Reflexed) (Collected: 09/27/2020  2:16 PM)  Result Value Ref Range   Enterococcus faecalis NOT DETECTED NOT DETECTED   Enterococcus Faecium NOT DETECTED NOT DETECTED   Listeria monocytogenes NOT DETECTED NOT DETECTED   Staphylococcus species DETECTED (A) NOT DETECTED   Staphylococcus aureus (BCID) DETECTED (A) NOT DETECTED   Staphylococcus epidermidis NOT DETECTED NOT DETECTED   Staphylococcus lugdunensis NOT DETECTED NOT DETECTED   Streptococcus species NOT DETECTED NOT DETECTED   Streptococcus agalactiae NOT DETECTED NOT DETECTED   Streptococcus pneumoniae NOT DETECTED NOT DETECTED   Streptococcus pyogenes NOT DETECTED NOT DETECTED   A.calcoaceticus-baumannii NOT DETECTED NOT DETECTED   Bacteroides fragilis NOT DETECTED NOT DETECTED   Enterobacterales NOT DETECTED NOT DETECTED   Enterobacter cloacae complex NOT DETECTED NOT DETECTED   Escherichia coli NOT DETECTED NOT DETECTED   Klebsiella aerogenes NOT DETECTED NOT DETECTED   Klebsiella oxytoca NOT DETECTED NOT DETECTED   Klebsiella pneumoniae NOT DETECTED NOT DETECTED   Proteus species NOT DETECTED NOT DETECTED   Salmonella species NOT DETECTED NOT DETECTED   Serratia  marcescens NOT DETECTED NOT DETECTED   Haemophilus influenzae NOT DETECTED NOT DETECTED   Neisseria meningitidis NOT DETECTED NOT DETECTED   Pseudomonas aeruginosa NOT DETECTED NOT DETECTED   Stenotrophomonas maltophilia NOT DETECTED NOT DETECTED   Candida albicans NOT DETECTED NOT DETECTED   Candida auris NOT DETECTED NOT DETECTED   Candida glabrata NOT DETECTED NOT DETECTED   Candida krusei NOT DETECTED NOT DETECTED   Candida parapsilosis NOT DETECTED NOT DETECTED   Candida tropicalis NOT DETECTED NOT DETECTED   Cryptococcus neoformans/gattii NOT DETECTED NOT DETECTED   Meth resistant mecA/C and MREJ DETECTED (A) NOT [place], PharmD, [facility] Resident [phone] 09/28/2020 10:27 AM

## 2020-09-28 NOTE — Code Documentation (Addendum)
Pt was admitted yesterday for fever weakness and chills. He was WNL last at 0100, but at shift change (around 7) he was found to be unable to speak and he was twitching. Pt sent for stat CT non-con. That exam neg for hemorrhage per neurologist. Code stroke called. MD ordered stat MRI. SRN met pt in hall on way to MRI. Pt is non-focal, alert and globally aphasic. Only verbal response to all questions is "[place]". See flowsheet for full NIHSS. MRI positive for stroke per Neurologist. No TPA as pt outside of window. No NIR as negative for LVO. Pt returned to 5 M with RN. Pt will need q 2 NIHSS and VS for 12 hr, then q 4. Hospitalist to transfer pt to [address]. Bedside handoff with Eboni RN. 

## 2020-09-28 NOTE — Significant Event (Signed)
Rapid Response Event Note   Reason for Call : Altered Mental Status    Initial Focused Assessment: Upon assessment, patient is alert, warm and dry. Patient is confused, only able to answer "yeah" to orientation questions. Later was able to say "[place]." Lungs were clear bilaterally. Pupils were equal, although patient refused light accomodation assessment. He moves all extremities purposefully but does have tremor throughout which is new.  VS: 99.8 T, 124/89, HR 99, RR 28, O2 91% on room air CBG: 92  Interventions:  Dr. Darrick Meigs paged and made aware. MD to bedside to assess.  [id number]: CODE STROKE initiated. Dr. Malen Gauze at bedside. MRI head ordered.  Plan of Care:  MRI Head per MD Stroke response RN to advise on neuro assessments.   Event Summary:   MD Notified: [id number] Call Time: 0717 Arrival Time: 0720 End Time: 0821  Netta Corrigan, RN

## 2020-09-28 NOTE — Consult Note (Signed)
Neurology Consultation  CC: Encephalopathy, word finding difficulty-code stroke  History is obtained from: Inpatient RN, chart review, attending MD  HPI: Patrick Anthony is a 50 y.o. male with a medical history significant for end-stage renal disease due to adult polycystic kidney disease on hemodialysis Monday, Wednesday, Friday with left upper extremity AV graft, hypertension, obstructive sleep apnea, chronic heart failure with preserved ejection fraction, hepatitis C with liver cirrhosis, and history of alcohol abuse who presented to the hospital 09/27/20 for evaluation of generalized weakness, lethargy, tachycardia, fever, and chills. He was found to be septic with a temperature of 103.2, a heart rate in the 140's, leukocytosis, and an elevated lactic acidosis with lactic acid of 3.5; presumably secondary to a left lower lobe pneumonia identified on x-ray.   09/28/20: At 01:00, patient was admitted to the floor communicating normally and following all commands for nursing staff. At 06:00 he was noted to be less communicative repeating "yeah" to all orientation questions and not following commands. He was able to state his first name with delayed response and was noted to have tremor of the upper extremities, subsequently a code stroke was activated and he was taken to CT for imaging.    LKW: 0100 tpa given?: No, outside of time window IR Thrombectomy? No, no LVO on imaging Modified Rankin Scale: 0-Completely asymptomatic and back to baseline post- stroke NIHSS:  1a Level of Conscious.: 0 1b LOC Questions: 2 1c LOC Commands: 2 2 Best Gaze: 0 3 Visual: 0 4 Facial Palsy: 0 5a Motor Arm - left: 0 5b Motor Arm - Right: 0 6a Motor Leg - Left: 2 6b Motor Leg - Right: 2 7 Limb Ataxia: 0 8 Sensory: 0 9 Best Language: 2 10 Dysarthria: 2 11 Extinct. and Inatten.: 0 TOTAL: 12  ROS: Unable to obtain due to altered mental status.   Past Medical History:  Diagnosis Date  . Chronic kidney  disease    patient on dialysis tues-thurs-sat  . Cirrhosis of liver ([place])   . Drug addiction ([place])   . ETOH abuse   . Hepatitis   . Hypertension   . Renal disorder renal failure  . Sleep apnea   . Systolic and diastolic CHF, 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[place]) 04/19/2013   Past Surgical History:  Procedure Laterality Date  . AV FISTULA PLACEMENT Right   . AV FISTULA PLACEMENT Left 09/03/2013   Procedure: ARTERIOVENOUS (AV) FISTULA CREATION - LEFT BRACHIOCEPHALIC;  Surgeon: Conrad Hurley, MD;  Location: [facility] OR;  Service: Vascular;  Laterality: Left;  . AV FISTULA PLACEMENT Left 10/30/2019   Procedure: INSERTION OF ARTERIOVENOUS (AV) GORE-TEX GRAFT ARM;  Surgeon: Rosetta Posner, MD;  Location: [facility];  Service: Vascular;  Laterality: Left;  . INSERTION OF DIALYSIS CATHETER N/A 08/29/2013   Procedure: INSERTION OF DIALYSIS CATHETER;  Surgeon: Mal Misty, MD;  Location: [place] [facility];  Service: Vascular;  Laterality: N/A;  . INSERTION OF DIALYSIS CATHETER Left 10/30/2019   Procedure: INSERTION OF DIALYSIS CATHETER LEFT INTERNAL JUGULAR;  Surgeon: Rosetta Posner, MD;  Location: [place];  Service: Vascular;  Laterality: Left;  . PERIPHERAL VASCULAR CATHETERIZATION N/A 01/06/2015   Procedure: A/V Shuntogram/Fistulagram;  Surgeon: Katha Cabal, MD;  Location: [place] [facility];  Service: Cardiovascular;  Laterality: N/A;  . PERIPHERAL VASCULAR CATHETERIZATION Left 01/06/2015   Procedure: A/V Shunt Intervention;  Surgeon: Katha Cabal, MD;  Location: [place] [facility];  Service: Cardiovascular;  Laterality: Left;   Family History  Problem Relation  Age of Onset  . Hypertension Mother   . Cancer Mother        breast   Social History:  reports that he has been smoking cigarettes. He has been smoking about 0.20 packs per day. He has never used smokeless tobacco. He reports that he does not drink alcohol and does not use drugs.  Current Scheduled Medications: Current Outpatient Medications  Medication  Instructions  . amLODipine (NORVASC) 10 mg, Oral, Every morning  . Auryxia 420 mg, Oral, 3 times daily  . calcium acetate (PHOSLO) 2,001 mg, Oral, 3 times daily with meals  . cloNIDine (CATAPRES) 0.3 mg, Oral, 3 times daily  . Darbepoetin Alfa (ARANESP, ALBUMIN FREE, IJ) Darbepoetin Alfa (Aranesp)  . gabapentin (NEURONTIN) 200 mg, Oral, Daily at bedtime  . heparin 1000 unit/mL SOLN injection Heparin Sodium (Porcine) 1,000 Units/mL Systemic  . hydrALAZINE (APRESOLINE) 100 mg, Oral, 2 times daily  . metoprolol tartrate (LOPRESSOR) 75 mg, Oral, 2 times daily  . nitroGLYCERIN (NITROSTAT) 0.4 mg, Sublingual, Every 5 min x3 PRN  . oxyCODONE-acetaminophen (PERCOCET) 7.5-325 MG tablet 1 tablet, Oral, Every 4 hours PRN  . Sensipar 30 mg, Oral, Daily  . Viagra 50 mg, Oral, Daily PRN   Exam: Current vital signs: BP 124/89   Pulse 99   Temp 99.8 F (37.7 C) (Oral)   Resp (!) 28   Ht 6' (1.829 m)   Wt 83.9 kg   SpO2 91%   BMI 25.09 kg/m   Physical Exam  Constitutional: Appears well-developed and well-nourished.  Psych: Appears anxious on examination Eyes: No scleral injection, normal conjunctiva Head: Normocephalic and atraumatic without obvious deformity EENT: No OP obstruction. .  Cardiovascular: Normal rate to tachycardic on cardiac monitor, extremities warm without edema Respiratory: Non-labored breathing with increased rate GI: Soft.  No distension.  Skin: WDI  Neuro: Mental Status: Patient is awake and alert. Patient states his first name with delayed response but does not answer further orientation questions. Answers "yeah" to all other examiner questions. Poor attention noted on examination. He does not follow commands but attempts to mimic examiner. He will hold upper extremities against gravity with constant coaching. When asked to touch his nose, he will touch with his right hand but will not with the left and is unable to follow 2-step commands. Patient is unable to give a  clear and coherent history of present illness. Patient is aphasic with fragmented words. Naming not intact, able to name pen after long pause. Comprehension and fluency are not intact. No signs of neglect noted on examination.  Cranial Nerves: II: Visual Fields are full. Pupils are equal, round, and reactive to light. III,IV, VI: EOMI without ptosis.  V: Unable to assess; patient state "yeah" to all examiner questions.  VII: Face is symmetric resting and smiling VIII: Hearing is intact to voice X: Phonation not intact. Unable to assess palate due to patient not following commands.   XI: Head appears midline.  XII: Unable to assess tongue protrusion due to patient not following commands. Patient smiles or opens mouth briefly when asked to protrude tongue.  Motor: Tone is normal. Bulk is normal. Bilateral upper extremities 5/5 strength with antigravity movement without pronator drift with consistent coaching-question mild left upper extremity weakness when compared to the right upper extremity although still without vertical drift. Bilateral lower extremities extended when asked to hold off bed, 3/5 strength, both drift immediately to bed on assessment. Patient with asterixis of bilateral upper extremities on assessment.  Sensory: Withdrawals  equally to noxious stimulation of upper and lower extremities and states "ouch" but unable to assess sensation to light touch due to patient answering "yeah" to all questions.  Deep Tendon Reflexes: 1+ and symmetric in the biceps and patellae. Plantars: Toes are downgoing bilaterally.  Cerebellar: FNF and HKS are unable to be assessed due to patient unable to follow commands.   I have reviewed labs in epic and the pertinent results are: CMP     Component Value Date/Time   NA 135 09/28/2020 0436   K 5.0 09/28/2020 0436   CL 96 (L) 09/28/2020 0436   CO2 22 09/28/2020 0436   GLUCOSE 119 (H) 09/28/2020 0436   BUN 76 (H) 09/28/2020 0436   CREATININE  11.10 (H) 09/28/2020 0436   CALCIUM 8.0 (L) 09/28/2020 0436   PROT 8.4 (H) 09/27/2020 1416   PROT 8.9 (H) 12/04/2019 1252   ALBUMIN 2.9 (L) 09/27/2020 1416   AST 42 (H) 09/27/2020 1416   ALT 15 09/27/2020 1416   ALKPHOS 107 09/27/2020 1416   BILITOT 2.1 (H) 09/27/2020 1416   GFRNONAA 5 (L) 09/28/2020 0436   GFRAA 5 (L) 10/31/2019 0252   CBC    Component Value Date/Time   WBC 29.6 (H) 09/28/2020 0436   RBC 3.44 (L) 09/28/2020 0436   HGB 9.3 (L) 09/28/2020 0436   HCT 28.1 (L) 09/28/2020 0436   PLT 88 (L) 09/28/2020 0436   MCV 81.7 09/28/2020 0436   MCH 27.0 09/28/2020 0436   MCHC 33.1 09/28/2020 0436   RDW 14.8 09/28/2020 0436   LYMPHSABS 0.9 02/13/2014 1444   MONOABS 0.3 02/13/2014 1444   EOSABS 0.2 02/13/2014 1444   BASOSABS 0.0 02/13/2014 1444   Ammonia:  Lab Results  Component Value Date   INR 1.4 (H) 09/27/2020   INR 1.0 10/30/2019   INR 1.21 08/29/2013    I have reviewed the images obtained: CT head non-contrast:  10 mm infarct within the medial left cerebellar hemisphere, which may be acute. Consider brain MRI for further evaluation. No acute intracranial hemorrhage or acute demarcated cortical infarct. Background mild generalized atrophy of the brain and chronic small vessel ischemic disease.  MRI brain/MR angio head and neck:  Assessment: 50 year old male with medical history as above who presented 09/27/20 for generalized weakness and lethargy and was found to be septic initially thought to be due to LLL pneumonia and started on Vancomycin, Cefepime, and Flagyl. This morning, he was found by nursing staff with acute encephalopathy-word finding difficulty and inability to communicate.  Exam with mild left upper extremity weakness but no drift and no cranial nerve deficits or sensory deficits but with consistent inability to verbalize and consistently name objects.  Also has asterixis. CT head with evidence of a 10 mm infarct within the medical left cerebellar  hemisphere.  - MRI brain multifocal infarcts involving bilateral cerebral and cerebellar hemispheres. - Labs reveal a worsening leukocytosis (23.2 --> 29.6), worsening renal function (Cr. 10.3 --> 11.1, BUN 48 --> 76), and anemia (Hgb 11.0 --> 9.3) since yesterday's labs.  - Concern for infective endocarditis due to worsening leukocytosis, sepsis, embolic nature of acute cerebellar and cerebral CVA; infectious work-up appropriate while holding anticoagulation and antiplatelet medications at this time due to increased risk for bleeding with septic emboli.  Impression: Acute ischemic stroke-likely cardioembolic versus concomitant small vessel disease Cirrhosis of liver with history of ETOH abuse and hepatitis C ESRD Sepsis  Recommendations: - HgbA1c, fasting lipid panel - Frequent neuro checks - Echocardiogram -  Prophylactic therapy- Not appropriate at this time, concern for endocarditis with septic emboli - Risk factor modification - Telemetry monitoring - PT consult, OT consult, Speech consult -Work-up for infective endocarditis including blood cultures per primary team.  Might need TEE after the 2D echo. - Stroke team to follow  Anibal Henderson, AGAC-NP [facility] Pager: [phone]   Attending Neurohospitalist Addendum Patient seen and examined with APP/Resident. Agree with the history and physical as documented above. Agree with the plan as documented, which I helped formulate. I have independently reviewed the chart, obtained history, review of systems and examined the patient.I have personally reviewed pertinent head/neck/spine imaging (CT/MRI).  In hospital code stroke for word finding difficulty and aphasia.  Last known well outside the window for IV TPA. Please see detailed exam above which I independently confirmed and verified. I have independently reviewed images-MRI of the brain shows bilateral cerebral hemispheric infarcts along with bilateral cerebellar  infarcts-etiology likely cardioembolic versus concomitant small vessel disease given the patient's multiple cerebrovascular risk factors. We will hold antiplatelets or anticoagulants at this time until further work-up is done to rule out infective endocarditis and mycotic aneurysms by the MRA of the head and neck, which has been ordered and pending. Preliminary plan discussed with Dr. Darrick Meigs  -- Amie Portland, MD Neurologist [facility] Pager: [phone]    CRITICAL CARE ATTESTATION Performed by: Amie Portland, MD Total critical care time: 60 minutes Critical care time was exclusive of separately billable procedures and treating other patients and/or supervising APPs/Residents/Students Critical care was necessary to treat or prevent imminent or life-threatening deterioration due to acute ischemic stroke This patient is critically ill and at significant risk for neurological worsening and/or death and care requires constant monitoring. Critical care was time spent personally by me on the following activities: development of treatment plan with patient and/or surrogate as well as nursing, discussions with consultants, evaluation of patient's response to treatment, examination of patient, obtaining history from patient or surrogate, ordering and performing treatments and interventions, ordering and review of laboratory studies, ordering and review of radiographic studies, pulse oximetry, re-evaluation of patient's condition, participation in multidisciplinary rounds and medical decision making of high complexity in the care of this patient.

## 2020-09-28 NOTE — Progress Notes (Signed)
[facility]  PROGRESS NOTE  Patrick Anthony [id number] DOB: 02-11-1971 DOA: 09/27/2020 PCP: [name], MD   Brief HPI:   50 year old male with medical history of end-stage renal disease due to APKD on hemodialysis MWF through left upper extremity AV graft, hypertension, OSA, chronic diastolic CHF, 0000000 to [id number], liver cirrhosis due to hepatitis C, history of alcohol abuse came to hospital with generalized weakness. Chest x-ray showed possible opacity at left lower lobe is concerning for pneumonia. CT abdomen/pelvis showed findings of adult polycystic kidney disease. Patient has been started on vancomycin, cefepime, Flagyl.    Subjective   This morning, rapid response was called after patient was found to be in altered mental status. Rapid response RN called me and I saw the patient. Patient was alert, awake, followed commands, however was unable to speak and answered all questions with "yeah". Patient was normal around 1 AM as per RN. This morning at 6 AM he was unable to answer questions and was only answering questions with "yeah".   Assessment/Plan:     1. New onset aphasia/embolic stroke-concern for stroke, CT head stat was obtained, also initiated code stroke. Called and discussed with Dr. Rory Percy who saw the patient for code stroke. Patient found to have 10 mm infarct within the medial left cerebellar hemisphere which may be acute. MRI brain was recommended. MRI brain showed scattered small acute infarcts within the bilateral cerebral hemispheres, right basal ganglia, callosal splenium and bilateral cerebral hemisphere. Multiple vascular territories are involved and findings highly suspicious for embolic process. Will initiate stroke order set, transferred patient to [address] NIH stroke scale, check lipid profile, hemoglobin A1c. Antiplatelet therapy on hold as per neurology. Will order TTE to rule out underlying valve vegetation as source of embolic stroke. 2. Sepsis due to  pneumonia-patient presented with fever, tachycardia, leukocytosis. Chest x-ray showed opacity in left lower lobe. Patient started on broad-spectrum antibiotics including vancomycin, cefepime and Flagyl. 1 bottle out of 4 in anaerobic is growing gram-positive cocci in clusters, final result is pending. We will continue with antibiotics. 3. Bacteremia-staph aureus growing 1 out of 4 bottles, could be contamination. Final result is pending. We will continue with broad-spectrum antibiotics at this time. 4. ESRD due to adult polycystic kidney disease-patient gets dialysis MWF through left upper extremity fistula, continue PhosLo for hyperphosphatemia, Sensipar for secondary hyperparathyroidism. Nephrology following. 5. Anemia of chronic disease-hemoglobin has dropped to 9.3 this morning. Continue to monitor. Patient is on ESA as outpatient. Will follow CBC in a.m. 6. Hypertension-patient is on multiple antihypertensive medication including clonidine 0.3 mg 3 times daily, hydralazine 100 mg twice daily, metoprolol 75 mg twice daily, amlodipine 10 mg daily. We will hold all medications except clonidine for permissive hypertension. Would continue with clonidine considering it can cause significant rebound hypertension. Goal is to keep SBP greater than 140. Discussed with neurology. 7. Liver cirrhosis due to hepatitis C-patient has chronic HCV infection, HIV Shelda Pal was nonreactive during prior hospital stay.     COVID-19 Labs  No results for input(s): DDIMER, FERRITIN, LDH, CRP in the last 72 hours.  Lab Results  Component Value Date   SARSCOV2NAA NEGATIVE 09/27/2020   [place] NEGATIVE 10/29/2019     Scheduled medications:   . calcium acetate  667 mg Oral TID WC  . calcium acetate  667 mg Oral With snacks  . cinacalcet  30 mg Oral Q breakfast  . cloNIDine  0.3 mg Oral TID  . gabapentin  200 mg Oral QHS  .  heparin  5,000 Units Subcutaneous Q8H  . hydrALAZINE  100 mg Oral BID          CBG: Recent Labs  Lab 09/28/20 0721 09/28/20 0928  GLUCAP 92 103*    SpO2: 100 % O2 Flow Rate (L/min): 3 L/min    CBC: Recent Labs  Lab 09/27/20 1125 09/28/20 0436  WBC 23.2* 29.6*  HGB 11.0* 9.3*  HCT 32.6* 28.1*  MCV 82.3 81.7  PLT 83* 88*    Basic Metabolic Panel: Recent Labs  Lab 09/27/20 1125 09/27/20 1416 09/28/20 0436  NA 137 137 135  K 4.4 4.6 5.0  CL 92* 92* 96*  CO2 '25 23 22  '$ GLUCOSE 86 93 119*  BUN 45* 48* 76*  CREATININE 9.82* 10.30* 11.10*  CALCIUM 8.8* 8.6* 8.0*     Liver Function Tests: Recent Labs  Lab 09/27/20 1416  AST 42*  ALT 15  ALKPHOS 107  BILITOT 2.1*  PROT 8.4*  ALBUMIN 2.9*     Antibiotics: Anti-infectives (From admission, onward)   Start     Dose/Rate Route Frequency Ordered Stop   09/29/20 1200  vancomycin (VANCOCIN) IVPB 1000 mg/200 mL premix        1,000 mg 200 mL/hr over 60 Minutes Intravenous Every M-W-F (Hemodialysis) 09/27/20 1443     09/29/20 1200  ceFEPIme (MAXIPIME) 2 g in sodium chloride 0.9 % 100 mL IVPB        2 g 200 mL/hr over 30 Minutes Intravenous Every M-W-F (Hemodialysis) 09/27/20 1443     09/28/20 0900  metroNIDAZOLE (FLAGYL) IVPB 500 mg        500 mg 100 mL/hr over 60 Minutes Intravenous Every 12 hours 09/27/20 1808     09/28/20 0900  ceFEPIme (MAXIPIME) 1 g in sodium chloride 0.9 % 100 mL IVPB  Status:  Discontinued        1 g 200 mL/hr over 30 Minutes Intravenous Every 12 hours 09/27/20 1808 09/27/20 1812   09/27/20 1445  vancomycin (VANCOREADY) IVPB 2000 mg/400 mL        2,000 mg 200 mL/hr over 120 Minutes Intravenous  Once 09/27/20 1433 09/27/20 2330   09/27/20 1430  ceFEPIme (MAXIPIME) 2 g in sodium chloride 0.9 % 100 mL IVPB        2 g 200 mL/hr over 30 Minutes Intravenous  Once 09/27/20 1418 09/27/20 1541   09/27/20 1430  metroNIDAZOLE (FLAGYL) IVPB 500 mg        500 mg 100 mL/hr over 60 Minutes Intravenous  Once 09/27/20 1418 09/27/20 2214   09/27/20 1430  vancomycin (VANCOREADY)  IVPB 1750 mg/350 mL  Status:  Discontinued        1,750 mg 175 mL/hr over 120 Minutes Intravenous  Once 09/27/20 1418 09/27/20 1433       DVT prophylaxis: Heparin  Code Status: Full code  Family Communication: No family at bedside   Consultants:  Nephrology  Procedures:      Objective   Vitals:   09/28/20 0130 09/28/20 0542 09/28/20 0724 09/28/20 0929  BP: 106/78 109/86 124/89 124/87  Pulse: 98 (!) 101 99 88  Resp: (!) 28 (!) 24 (!) 28 (!) 30  Temp: 98.6 F (37 C) 100.2 F (37.9 C) 99.8 F (37.7 C) 98.3 F (36.8 C)  TempSrc: Oral Oral Oral Oral  SpO2: 99% 98% 91% 100%  Weight:      Height:        Intake/Output Summary (Last 24 hours) at 09/28/2020 1021 Last data filed  at 09/28/2020 0814 Gross per 24 hour  Intake 0 ml  Output 0 ml  Net 0 ml    No intake/output data recorded.  Filed Weights   09/27/20 1400  Weight: 83.9 kg    Physical Examination:    General: Appears mildly anxious  Cardiovascular: S1-S2, regular, no murmur auscultated  Respiratory: Clear to auscultation bilaterally, no wheezing or crackles auscultated  Abdomen: Abdomen is soft, nontender, no organomegaly  Extremities: No edema in the lower extremities  Neurologic: Alert, follow commands, moving all extremities, aphasic   Status is: Inpatient  Dispo: The patient is from: Home              Anticipated d/c is to: Home versus skilled nursing facility              Anticipated d/c date is: 10/04/2020              Patient currently not stable for discharge  Barrier to discharge-stroke, sepsis     Data Reviewed:   Recent Results (from the past 240 hour(s))  Blood Culture (routine x 2)     Status: None (Preliminary result)   Collection Time: 09/27/20  2:16 PM   Specimen: BLOOD RIGHT HAND  Result Value Ref Range Status   Specimen Description BLOOD RIGHT HAND  Final   Special Requests   Final    BOTTLES DRAWN AEROBIC AND ANAEROBIC Blood Culture results may not be optimal  due to an inadequate volume of blood received in culture bottles   Culture  Setup Time   Final    GRAM POSITIVE COCCI IN CLUSTERS ANAEROBIC BOTTLE ONLY Organism ID to follow    Culture   Final    NO GROWTH < 24 HOURS Performed at [facility], [place] [address] [place],  [address]    Report Status PENDING  Incomplete  Resp Panel by RT-PCR (Flu A&B, Covid) Nasopharyngeal Swab     Status: None   Collection Time: 09/27/20  2:16 PM   Specimen: Nasopharyngeal Swab; Nasopharyngeal(NP) swabs in vial transport medium  Result Value Ref Range Status   SARS Coronavirus 2 by RT PCR NEGATIVE NEGATIVE Final    Comment: (NOTE) SARS-CoV-2 target nucleic acids are NOT DETECTED.  The SARS-CoV-2 RNA is generally detectable in upper respiratory specimens during the acute phase of infection. The lowest concentration of SARS-CoV-2 viral copies this assay can detect is 138 copies/mL. A negative result does not preclude SARS-Cov-2 infection and should not be used as the sole basis for treatment or other patient management decisions. A negative result may occur with  improper specimen collection/handling, submission of specimen other than nasopharyngeal swab, presence of viral mutation(s) within the areas targeted by this assay, and inadequate number of viral copies(<138 copies/mL). A negative result must be combined with clinical observations, patient history, and epidemiological information. The expected result is Negative.  Fact Sheet for Patients:  EntrepreneurPulse.com.au  Fact Sheet for Healthcare Providers:  IncredibleEmployment.be  This test is no t yet approved or cleared by the [place] FDA and  has been authorized for detection and/or diagnosis of SARS-CoV-2 by FDA under an Emergency Use Authorization (EUA). This EUA will remain  in effect (meaning this test can be used) for the duration of the COVID-19 declaration under Section 564(b)(1)  of the Act, 21 U.S.C.section 360bbb-3(b)(1), unless the authorization is terminated  or revoked sooner.       Influenza A by PCR NEGATIVE NEGATIVE Final   Influenza B by PCR  NEGATIVE NEGATIVE Final    Comment: (NOTE) The Xpert Xpress SARS-CoV-2/FLU/RSV plus assay is intended as an aid in the diagnosis of influenza from Nasopharyngeal swab specimens and should not be used as a sole basis for treatment. Nasal washings and aspirates are unacceptable for Xpert Xpress SARS-CoV-2/FLU/RSV testing.  Fact Sheet for Patients: EntrepreneurPulse.com.au  Fact Sheet for Healthcare Providers: IncredibleEmployment.be  This test is not yet approved or cleared by the [place] FDA and has been authorized for detection and/or diagnosis of SARS-CoV-2 by FDA under an Emergency Use Authorization (EUA). This EUA will remain in effect (meaning this test can be used) for the duration of the COVID-19 declaration under Section 564(b)(1) of the Act, 21 U.S.C. section 360bbb-3(b)(1), unless the authorization is terminated or revoked.  Performed at [facility], [place] [address], [place], [place] [address]   Blood Culture ID Panel (Reflexed)     Status: Abnormal   Collection Time: 09/27/20  2:16 PM  Result Value Ref Range Status   Enterococcus faecalis NOT DETECTED NOT DETECTED Final   Enterococcus Faecium NOT DETECTED NOT DETECTED Final   Listeria monocytogenes NOT DETECTED NOT DETECTED Final   Staphylococcus species DETECTED (A) NOT DETECTED Final    Comment: CRITICAL RESULT CALLED TO, READ BACK BY AND VERIFIED WITH: C,PIERCE PHARMD '@1009'$  09/28/20 EB    Staphylococcus aureus (BCID) DETECTED (A) NOT DETECTED Final    Comment: Methicillin (oxacillin)-resistant Staphylococcus aureus (MRSA). MRSA is predictably resistant to beta-lactam antibiotics (except ceftaroline). Preferred therapy is vancomycin unless clinically contraindicated. Patient requires contact  precautions if  hospitalized. CRITICAL RESULT CALLED TO, READ BACK BY AND VERIFIED WITH: C,PIERCE PHARMD '@1009'$  09/28/20 EB    Staphylococcus epidermidis NOT DETECTED NOT DETECTED Final   Staphylococcus lugdunensis NOT DETECTED NOT DETECTED Final   Streptococcus species NOT DETECTED NOT DETECTED Final   Streptococcus agalactiae NOT DETECTED NOT DETECTED Final   Streptococcus pneumoniae NOT DETECTED NOT DETECTED Final   Streptococcus pyogenes NOT DETECTED NOT DETECTED Final   A.calcoaceticus-baumannii NOT DETECTED NOT DETECTED Final   Bacteroides fragilis NOT DETECTED NOT DETECTED Final   Enterobacterales NOT DETECTED NOT DETECTED Final   Enterobacter cloacae complex NOT DETECTED NOT DETECTED Final   Escherichia coli NOT DETECTED NOT DETECTED Final   Klebsiella aerogenes NOT DETECTED NOT DETECTED Final   Klebsiella oxytoca NOT DETECTED NOT DETECTED Final   Klebsiella pneumoniae NOT DETECTED NOT DETECTED Final   Proteus species NOT DETECTED NOT DETECTED Final   Salmonella species NOT DETECTED NOT DETECTED Final   Serratia marcescens NOT DETECTED NOT DETECTED Final   Haemophilus influenzae NOT DETECTED NOT DETECTED Final   Neisseria meningitidis NOT DETECTED NOT DETECTED Final   Pseudomonas aeruginosa NOT DETECTED NOT DETECTED Final   Stenotrophomonas maltophilia NOT DETECTED NOT DETECTED Final   Candida albicans NOT DETECTED NOT DETECTED Final   Candida auris NOT DETECTED NOT DETECTED Final   Candida glabrata NOT DETECTED NOT DETECTED Final   Candida krusei NOT DETECTED NOT DETECTED Final   Candida parapsilosis NOT DETECTED NOT DETECTED Final   Candida tropicalis NOT DETECTED NOT DETECTED Final   Cryptococcus neoformans/gattii NOT DETECTED NOT DETECTED Final   Meth resistant mecA/C and MREJ DETECTED (A) NOT DETECTED Final    Comment: CRITICAL RESULT CALLED TO, READ BACK BY AND VERIFIED WITH: C,PIERCE PHARMD '@1009'$  09/28/20 EB Performed at [facility], [address]  [place], [place] [address]   Blood Culture (routine x 2)     Status: None (Preliminary result)  Collection Time: 09/27/20  2:29 PM   Specimen: BLOOD  Result Value Ref Range Status   Specimen Description BLOOD SITE NOT SPECIFIED  Final   Special Requests   Final    BOTTLES DRAWN AEROBIC AND ANAEROBIC Blood Culture results may not be optimal due to an inadequate volume of blood received in culture bottles   Culture   Final    NO GROWTH < 24 HOURS Performed at [facility], [address] [place], [place] [address]    Report Status PENDING  Incomplete    No results for input(s): LIPASE, AMYLASE in the last 168 hours. Recent Labs  Lab 09/28/20 0826  AMMONIA 27    Cardiac Enzymes: No results for input(s): CKTOTAL, CKMB, CKMBINDEX, TROPONINI in the last 168 hours. BNP (last 3 results) No results for input(s): BNP in the last 8760 hours.  ProBNP (last 3 results) No results for input(s): PROBNP in the last 8760 hours.  Studies:  CT ABDOMEN PELVIS WO CONTRAST  Result Date: 09/27/2020 CLINICAL DATA:  Abdominal pain.  Chronic renal failure EXAM: CT ABDOMEN AND PELVIS WITHOUT CONTRAST TECHNIQUE: Multidetector CT imaging of the abdomen and pelvis was performed following the standard protocol without IV contrast. COMPARISON:  None. FINDINGS: Lower chest: There is bibasilar atelectatic change. There is no lung base edema or consolidation. There are multiple foci of coronary artery calcification. Hepatobiliary: No focal liver lesions are evident on this noncontrast enhanced study. Gallbladder wall is not appreciably thickened. There is no biliary duct dilatation. Pancreas: No pancreatic mass or inflammatory focus. Spleen: No splenic lesions are evident. Adrenals/Urinary Tract: Adrenals bilaterally appear unremarkable. Innumerable cysts are noted throughout each kidney, several of which show peripheral calcification. There is a calcified mass arising from the periphery of the lower pole of the  left kidney measuring 1.9 x 1.1 cm. Largest well-defined individual cyst is seen arising from the lateral right kidney measuring 2.6 x 2.2 cm. There is no appreciable hydronephrosis on either side. There are scattered suspected 1 mm calculi in each kidney in addition to areas of suspected calcification in the periphery of multiple cysts. There is no ureteral calculus on either side. Urinary bladder is midline with equivocal urinary bladder wall thickening. Stomach/Bowel: There is no appreciable bowel wall or mesenteric thickening. There is no appreciable bowel obstruction. The terminal ileum appears normal. There is no appreciable free air or portal venous air. Vascular/Lymphatic: There is no abdominal aortic aneurysm. There is aortic atherosclerosis. There are also foci of calcification major pelvic and mesenteric arterial vessels. There is no appreciable adenopathy in the abdomen or pelvis. Reproductive: Prostate and seminal vesicles are normal in size and contour. There are calcifications in seminal vesicles bilaterally. Other: Appendix appears normal. No evident abscess or ascites in the abdomen or pelvis. There is slight fat in the umbilicus. Musculoskeletal: Postoperative changes noted in the proximal right femur. Several metallic pellets are noted in the proximal right femur region. Degenerative change noted in the lumbar spine with vacuum phenomenon at L5-S1 and to a lesser degree at L4-5. No blastic or lytic bone lesions are evident. No intramuscular lesions. IMPRESSION: 1. Findings indicative of adult polycystic kidney disease. Multiple calcifications are noted in the periphery of several cysts which is typical with this entity. A suspected collapsed cyst with calcification is noted in the periphery of the left kidney measuring 1.9 x 1.1 cm. 2. Scattered 1 mm calculi in each kidney. No hydronephrosis or ureteral calculus on either side. Equivocal thickening of the  urinary bladder wall could indicate a  degree of cystitis. Correlation with urinalysis in this regard advised. 3. No evident bowel obstruction. No abscess in the abdomen or pelvis. Appendix appears normal. 4. Aortic Atherosclerosis (ICD10-I70.0). Multiple foci of pelvic and mesenteric arterial vascular calcification noted. There are foci of coronary artery calcification. Electronically Signed   By: Lowella Grip III M.D.   On: 09/27/2020 15:25   CT HEAD WO CONTRAST  Result Date: 09/28/2020 CLINICAL DATA:  Delirium. Additional history provided by Dr. Rory Percy: Patient perseverating. EXAM: CT HEAD WITHOUT CONTRAST TECHNIQUE: Contiguous axial images were obtained from the base of the skull through the vertex without intravenous contrast. COMPARISON:  No pertinent prior exams available for comparison. FINDINGS: Brain: Mild cerebral and cerebellar atrophy. Mild patchy and ill-defined hypoattenuation within the cerebral white matter is nonspecific, but compatible with chronic small vessel ischemic disease. 10 mm infarct within the left cerebellar hemisphere, possibly acute (series 2, image 7) (series 4, image 54). There is no acute intracranial hemorrhage. No demarcated cortical infarct. No extra-axial fluid collection. No evidence of intracranial mass. No midline shift. Vascular: No hyperdense vessel.  Atherosclerotic calcifications Skull: Normal. Negative for fracture or focal lesion. Sinuses/Orbits: Visualized orbits show no acute finding. Trace frontal, ethmoid and sphenoid sinus mucosal thickening at the imaged levels. ASPECTS ([place] Stroke Program Early CT Score) - Ganglionic level infarction (caudate, lentiform nuclei, internal capsule, insula, M1-M3 cortex): 7 - Supraganglionic infarction (M4-M6 cortex): 3 Total score (0-10 with 10 being normal): 10 These results were called by telephone at the time of interpretation on 09/28/2020 at 8:18 am to provider Dr. Rory Percy, who verbally acknowledged these results. IMPRESSION: 10 mm infarct within the  medial left cerebellar hemisphere, which may be acute. Consider brain MRI for further evaluation. No acute intracranial hemorrhage or acute demarcated cortical infarct. Background mild generalized atrophy of the brain and chronic small vessel ischemic disease. Electronically Signed   By: Kellie Simmering DO   On: 09/28/2020 08:20   MR ANGIO HEAD WO CONTRAST  Result Date: 09/28/2020 CLINICAL DATA:  Neuro deficit, acute, stroke suspected. EXAM: MRI HEAD WITHOUT CONTRAST MRA HEAD WITHOUT CONTRAST TECHNIQUE: Multiplanar, multiecho pulse sequences of the brain and surrounding structures were obtained without intravenous contrast. Angiographic images of the head were obtained using MRA technique without contrast. COMPARISON:  Noncontrast head CT performed earlier today 09/28/2020. FINDINGS: MRI HEAD FINDINGS Brain: Mild cerebral and cerebellar atrophy. There are scattered small acute cortical and subcortical infarcts within the bilateral frontal, bilateral parietal, bilateral occipital and left temporal lobes. Small acute infarcts are also present within the midline callosum splenium, right caudate nucleus and bilateral cerebellar hemispheres. The largest acute infarct is present cortical/subcortical right frontal lobe and measures 2.3 x 0.5 cm (series 5, image 21). 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 A few small scattered supratentorial chronic microhemorrhages are also present. No evidence of intracranial mass. No extra-axial fluid collection. No midline shift. Vascular: Expected proximal arterial flow voids. Skull and upper cervical spine: No focal marrow lesion. Sinuses/Orbits: Visualized orbits show no acute finding. Mild bilateral ethmoid sinus mucosal thickening. MRA HEAD FINDINGS The intracranial internal carotid arteries are patent. The M1 middle cerebral arteries are patent. No M2 proximal branch occlusion or high-grade proximal stenosis is identified.  The anterior cerebral arteries are patent. The intracranial vertebral arteries are patent. The basilar artery is patent. The posterior cerebral arteries are patent. Posterior communicating arteries are present bilaterally. 2 mm infundibulum at the origin of  the left posterior communicating artery. MRI head Impression #1 will be called to the ordering clinician or representative by the Radiologist Assistant, and communication documented in the PACS or [employer]. IMPRESSION: MRI head: 1. 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 Multiple vascular territories are involved and findings are highly suspicious for an embolic process. 2. 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 A few scattered supratentorial chronic microhemorrhages are also present. 3. Mild generalized atrophy of the brain. MRA head: No intracranial large vessel occlusion or proximal high-grade arterial stenosis. Electronically Signed   By: Kellie Simmering DO   On: 09/28/2020 09:41   MR ANGIO NECK WO CONTRAST  Result Date: 09/28/2020 CLINICAL DATA:  Neuro deficit, acute, stroke suspected. EXAM: MRA NECK WITHOUT CONTRAST TECHNIQUE: Angiographic images of the neck were obtained using MRA technique without intravenous contrast. Carotid stenosis measurements (when applicable) are obtained utilizing NASCET criteria, using the distal internal carotid diameter as the denominator. COMPARISON:  Non-contrast head CT performed earlier today 09/28/2020. FINDINGS: The origins of the common carotid and vertebral arteries are excluded from the field of view. The visualized common carotid arteries and internal carotid arteries are patent within the neck without appreciable stenosis. The visualized vertebral arteries are patent within the neck without appreciable stenosis. IMPRESSION: The visualized common  carotid, internal carotid and vertebral arteries patent within the neck without appreciable stenosis. Electronically Signed   By: Kellie Simmering DO   On: 09/28/2020 09:53   MR BRAIN WO CONTRAST  Result Date: 09/28/2020 CLINICAL DATA:  Neuro deficit, acute, stroke suspected. EXAM: MRI HEAD WITHOUT CONTRAST MRA HEAD WITHOUT CONTRAST TECHNIQUE: Multiplanar, multiecho pulse sequences of the brain and surrounding structures were obtained without intravenous contrast. Angiographic images of the head were obtained using MRA technique without contrast. COMPARISON:  Noncontrast head CT performed earlier today 09/28/2020. FINDINGS: MRI HEAD FINDINGS Brain: Mild cerebral and cerebellar atrophy. There are scattered small acute cortical and subcortical infarcts within the bilateral frontal, bilateral parietal, bilateral occipital and left temporal lobes. Small acute infarcts are also present within the midline callosum splenium, right caudate nucleus and bilateral cerebellar hemispheres. The largest acute infarct is present cortical/subcortical right frontal lobe and measures 2.3 x 0.5 cm (series 5, image 21). 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 A few small scattered supratentorial chronic microhemorrhages are also present. No evidence of intracranial mass. No extra-axial fluid collection. No midline shift. Vascular: Expected proximal arterial flow voids. Skull and upper cervical spine: No focal marrow lesion. Sinuses/Orbits: Visualized orbits show no acute finding. Mild bilateral ethmoid sinus mucosal thickening. MRA HEAD FINDINGS The intracranial internal carotid arteries are patent. The M1 middle cerebral arteries are patent. No M2 proximal branch occlusion or high-grade proximal stenosis is identified. The anterior cerebral arteries are patent. The intracranial vertebral arteries are patent. The basilar artery is patent. The posterior cerebral arteries are patent.  Posterior communicating arteries are present bilaterally. 2 mm infundibulum at the origin of the left posterior communicating artery. MRI head Impression #1 will be called to the ordering clinician or representative by the Radiologist Assistant, and communication documented in the PACS or [employer]. IMPRESSION: MRI head: 1. 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 Multiple vascular territories are involved and findings are highly suspicious for an embolic process. 2. Foci of chronic hemorrhage within the  bilateral cerebellar hemispheres and cerebellar vermis. A few scattered supratentorial chronic microhemorrhages are also present. 3. Mild generalized atrophy of the brain. MRA head: No intracranial large vessel occlusion or proximal high-grade arterial stenosis. Electronically Signed   By: Kellie Simmering DO   On: 09/28/2020 09:41   DG Chest Port 1 View  Result Date: 09/27/2020 CLINICAL DATA:  Questionable sepsis evaluate for abnormality. Presenting from dialysis with acute complaint of body aches, chills and shortness of breath EXAM: PORTABLE CHEST 1 VIEW COMPARISON:  Shoulder radiograph September 17, 2020 and chest radiograph August 29, 2013 FINDINGS: Cardiomegaly. Low lung volumes. Streaky left basilar opacities. No significant pleural effusion or visible pneumothorax. Left vascular stent. Similar appearance of the mass like calcific density over the right shoulder. There is hazy calcification along the left shoulder. IMPRESSION: 1. Low lung volumes with streaky left basilar opacities which could represent atelectasis or infection. Electronically Signed   By: Dahlia Bailiff MD   On: 09/27/2020 14:59       Oswald Hillock   [facility] If 7PM-7AM, please contact night-coverage at [email or web address], Office  [phone]   09/28/2020, 10:21 AM  LOS: 1 day

## 2020-09-28 NOTE — Progress Notes (Signed)
EEG complete - results pending 

## 2020-09-28 NOTE — Progress Notes (Signed)
LTM EEG in process

## 2020-09-28 NOTE — Progress Notes (Signed)
New Admission Note:  Arrival Method: pt arrived by bed from 5 MW Mental Orientation: pt is disoriented x4 Telemetry: yes NSR to Sinus Tach noted on the monitor Assessment: Completed Skin: pt has hemodialysis access on left upper arm. Pt skin is intact IV: Pt has IV in rt FA infusing NS@ 100 mL/hr Pain:no signs or symptoms of pain or discomfort Safety Measures: Safety Fall Prevention Plan was given, discussed. Admission: Completed 3W: Patient has been orientated to the room, unit and the staff. Family:pt has no family present at the bedside.  Orders have been reviewed and implemented. Will continue to monitor the patient. Call light has been placed within reach and bed alarm has been activated.   Janus Molder, RN

## 2020-09-28 NOTE — Progress Notes (Incomplete)
   09/27/20 2045  Assess: MEWS Score  Temp 98 F (36.7 C)  BP (!) 155/116  Pulse Rate 98  ECG Heart Rate (!) 102  Resp (!) 30  SpO2 100 %  O2 Device Nasal Cannula  O2 Flow Rate (L/min) 3 L/min  Assess: MEWS Score  MEWS Temp 0  MEWS Systolic 0  MEWS Pulse 1  MEWS RR 2  MEWS LOC 0  MEWS Score 3  MEWS Score Color Yellow  Assess: if the MEWS score is Yellow or Red  Were vital signs taken at a resting state? Yes  Focused Assessment No change from prior assessment  Early Detection of Sepsis Score *See Row Information* High  MEWS guidelines implemented *See Row Information* Yes  Treat  MEWS Interventions Administered prn meds/treatments;Escalated (See documentation below)  Pain Scale 0-10  Pain Score 8  Pain Location Generalized  Take Vital Signs  Increase Vital Sign Frequency  Yellow: Q 2hr X 2 then Q 4hr X 2, if remains yellow, continue Q 4hrs  Escalate  MEWS: Escalate Yellow: discuss with charge nurse/RN and consider discussing with provider and RRT  Notify: Charge Nurse/RN  Name of Charge Nurse/RN Notified Fleurette Woolbright, RN  Date Charge Nurse/RN Notified 09/27/20  Time Charge Nurse/RN Notified 2045  Notify: Provider  Provider Name/Title N/A  Notify: Rapid Response  Name of Rapid Response RN Notified N/A  Document  Patient Outcome Not stable and remains on department  Progress note created (see row info) Yes

## 2020-09-28 NOTE — Progress Notes (Signed)
  Echocardiogram 2D Echocardiogram has been performed.  Patrick Anthony 09/28/2020, 3:38 PM

## 2020-09-28 NOTE — Plan of Care (Signed)
Paged by hospitalist regarding concern for seizure-like activity on both upper extremities. Ordered stat EEG. Discussed that EEG results with Dr. Hortense Ramal. Stat EEG showed generalized periodic discharges with triphasic morphology-pattern that can be on active interictal continuum and also can be seen with toxic metabolic and cephalopathy. Ativan challenge was performed-EEG showed some improvement but remained concerning for underlying ictal nature. Plan discussed with Dr. Hortense Ramal.  Recommendations updated: -Continue LTM EEG -Hold off on using antiepileptics for now until the EEG confirms true seizure activity -Stroke team will continue to follow.  Dr. Hortense Ramal will provide guidance on seizure management.  -- Amie Portland, MD Neurologist [facility] Pager: [phone]

## 2020-09-28 NOTE — Consult Note (Signed)
[facility]  Total days of antibiotics 2/vanco/cefepime/metronidazole           Reason for Consult:staph aureus bacteremia    Referring Physician: lama  Principal Problem:   Pneumonia due to infectious agent Active Problems:   Chronic diastolic CHF (congestive heart failure) (HCC)   HTN (hypertension)   Anemia in chronic kidney disease   ESRD on dialysis ([place])   Sepsis ([place])   ADPKD (autosomal dominant polycystic kidney disease)   Sepsis due to pneumonia ([place])   Lactic acidosis    HPI: Patrick Anthony is a 50 y.o. 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 He was found to be febrile to 103F, tachycardic with HR in 140s, leukocytosis of 23K. LA of 3.5, CXR showed LLL infiltrate thus was started vanco/cefepime/metronidazole. His infectious work up + MRSA bacteremia. During HD, he had episode of word finding difficulty, only responding the word "yeah" to all questions, which is roughly the same as he is for this interview. He underwent stroke coke work up, with mri showing scattered small acute infarcts with in the bilateral cerebral hemisphers, concerning for embolic process givne the multiple vascular territories that ar involved. He is awaiting TTE/TEE to see if signs of endocarditis.          Past Medical History:  Diagnosis Date  . Chronic kidney disease    patient on dialysis tues-thurs-sat  . Cirrhosis of liver ([place])   . Drug addiction ([place])   . ETOH abuse   . Hepatitis   . Hypertension   . Renal disorder renal failure  . Sleep apnea   . Systolic and diastolic CHF, 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[place]) 04/19/2013    Allergies: No Known Allergies  MEDICATIONS: . calcium acetate  667 mg Oral TID WC  . calcium acetate  667 mg Oral With  snacks  . cinacalcet  30 mg Oral Q breakfast  . cloNIDine  0.3 mg Oral TID  . gabapentin  200 mg Oral QHS  . hydrALAZINE  100 mg Oral BID    Social History   Tobacco Use  . Smoking status: Light Tobacco Smoker    Packs/day: 0.20    Types: Cigarettes  . Smokeless tobacco: Never Used  . Tobacco comment: couple puffs per day  Vaping Use  . Vaping Use: Never used  Substance Use Topics  . Alcohol use: No  . Drug use: No    Comment: Quit    Family History  Problem Relation Age of Onset  . Hypertension Mother   . Cancer Mother        breast    Review of Systems - unable to obtain due to expressive aphasia.   OBJECTIVE: Temp:  [98 F (36.7 C)-103.2 F (39.6 C)] 100.6 F (38.1 C) (02/22 1223) Pulse Rate:  [88-118] 94 (02/22 1223) Resp:  [22-37] 24 (02/22 1223) BP: (106-164)/(78-122) 151/103 (02/22 1223) SpO2:  [91 %-100 %] 99 % (02/22 1223) Weight:  [83.9 kg] 83.9 kg (02/21 1400) Physical Exam  Constitutional: He is oriented to person,only. He appears well-developed and well-nourished. No distress.  HENT:  Mouth/Throat: Oropharynx is clear and moist. No oropharyngeal exudate.  Cardiovascular: Normal rate, regular rhythm and normal heart sounds. Exam reveals no gallop and no friction rub.  No murmur heard.  Pulmonary/Chest: Effort normal and breath sounds normal. No respiratory distress. He has no wheezes.  Abdominal: Soft. Bowel sounds are normal. He exhibits no distension. There is no tenderness.  Lymphadenopathy:  He has no cervical adenopathy.  Neurological: He is alert and oriented to person, only. All questions "yeah" response Skin: Skin is warm and dry. No rash noted. No erythema.  Psychiatric: He has a normal mood and affect. His behavior is normal.     LABS: Results for orders placed or performed during the hospital encounter of 09/27/20 (from the past 48 hour(s))  Basic metabolic panel     Status: Abnormal   Collection Time: 09/27/20 11:25 AM  Result  Value Ref Range   Sodium 137 135 - 145 mmol/L   Potassium 4.4 3.5 - 5.1 mmol/L   Chloride 92 (L) 98 - 111 mmol/L   CO2 25 22 - 32 mmol/L   Glucose, Bld 86 70 - 99 mg/dL    Comment: Glucose reference range applies only to samples taken after fasting for at least 8 hours.   BUN 45 (H) 6 - 20 mg/dL   Creatinine, Ser 9.82 (H) 0.61 - 1.24 mg/dL   Calcium 8.8 (L) 8.9 - 10.3 mg/dL   GFR, Estimated 6 (L) >60 mL/min    Comment: (NOTE) Calculated using the CKD-EPI Creatinine Equation (2021)    Anion gap 20 (H) 5 - 15    Comment: Performed at [facility] [address] [place], [place] [address]  CBC     Status: Abnormal   Collection Time: 09/27/20 11:25 AM  Result Value Ref Range   WBC 23.2 (H) 4.0 - 10.5 K/uL   RBC 3.96 (L) 4.22 - 5.81 MIL/uL   Hemoglobin 11.0 (L) 13.0 - 17.0 g/dL   HCT 32.6 (L) 39.0 - 52.0 %   MCV 82.3 80.0 - 100.0 fL   MCH 27.8 26.0 - 34.0 pg   MCHC 33.7 30.0 - 36.0 g/dL   RDW 15.0 11.5 - 15.5 %   Platelets 83 (L) 150 - 400 K/uL    Comment: REPEATED TO VERIFY PLATELET COUNT CONFIRMED BY SMEAR    nRBC 0.0 0.0 - 0.2 %    Comment: Performed at [facility], [place] [address] [place], [place] [address]  Lactic acid, plasma     Status: Abnormal   Collection Time: 09/27/20  2:16 PM  Result Value Ref Range   Lactic Acid, Venous 3.5 (HH) 0.5 - 1.9 mmol/L    Comment: CRITICAL RESULT CALLED TO, READ BACK BY AND VERIFIED WITH: J.Tera Helper [id number] 09/27/20 CLARK,S Performed at [facility], [place] [address], [place], [place] [address]   Comprehensive metabolic panel     Status: Abnormal   Collection Time: 09/27/20  2:16 PM  Result Value Ref Range   Sodium 137 135 - 145 mmol/L   Potassium 4.6 3.5 - 5.1 mmol/L   Chloride 92 (L) 98 - 111 mmol/L   CO2 23 22 - 32 mmol/L   Glucose, Bld 93 70 - 99 mg/dL    Comment: Glucose reference range applies only to samples taken after fasting for at least 8 hours.   BUN 48 (H) 6 - 20 mg/dL   Creatinine, Ser 10.30 (H) 0.61 -  1.24 mg/dL   Calcium 8.6 (L) 8.9 - 10.3 mg/dL   Total Protein 8.4 (H) 6.5 - 8.1 g/dL   Albumin 2.9 (L) 3.5 - 5.0 g/dL   AST 42 (H) 15 -  41 U/L   ALT 15 0 - 44 U/L   Alkaline Phosphatase 107 38 - 126 U/L   Total Bilirubin 2.1 (H) 0.3 - 1.2 mg/dL   GFR, Estimated 6 (L) >60 mL/min    Comment: (NOTE) Calculated using the CKD-EPI Creatinine Equation (2021)    Anion gap 22 (H) 5 - 15    Comment: Performed at [facility] [address] [place], [place] [address]  Protime-INR     Status: Abnormal   Collection Time: 09/27/20  2:16 PM  Result Value Ref Range   Prothrombin Time 16.9 (H) 11.4 - 15.2 seconds   INR 1.4 (H) 0.8 - 1.2    Comment: (NOTE) INR goal varies based on device and disease states. Performed at [facility], [place] [address], [place], [place] [address]   APTT     Status: None   Collection Time: 09/27/20  2:16 PM  Result Value Ref Range   aPTT 36 24 - 36 seconds    Comment: Performed at [facility] [address], [place], [place] [address]  Blood Culture (routine x 2)     Status: None (Preliminary result)   Collection Time: 09/27/20  2:16 PM   Specimen: BLOOD RIGHT HAND  Result Value Ref Range   Specimen Description BLOOD RIGHT HAND    Special Requests      BOTTLES DRAWN AEROBIC AND ANAEROBIC Blood Culture results may not be optimal due to an inadequate volume of blood received in culture bottles   Culture  Setup Time      GRAM POSITIVE COCCI IN CLUSTERS ANAEROBIC BOTTLE ONLY Organism ID to follow    Culture      NO GROWTH < 24 HOURS Performed at [facility], [place] [address], [place], [place] [address]    Report Status PENDING   Resp Panel by RT-PCR (Flu A&B, Covid) Nasopharyngeal Swab     Status: None   Collection Time: 09/27/20  2:16 PM   Specimen: Nasopharyngeal Swab; Nasopharyngeal(NP) swabs in vial transport medium  Result Value Ref Range   SARS Coronavirus 2 by RT PCR NEGATIVE NEGATIVE    Comment: (NOTE) SARS-CoV-2 target  nucleic acids are NOT DETECTED.  The SARS-CoV-2 RNA is generally detectable in upper respiratory specimens during the acute phase of infection. The lowest concentration of SARS-CoV-2 viral copies this assay can detect is 138 copies/mL. A negative result does not preclude SARS-Cov-2 infection and should not be used as the sole basis for treatment or other patient management decisions. A negative result may occur with  improper specimen collection/handling, submission of specimen other than nasopharyngeal swab, presence of viral mutation(s) within the areas targeted by this assay, and inadequate number of viral copies(<138 copies/mL). A negative result must be combined with clinical observations, patient history, and epidemiological information. The expected result is Negative.  Fact Sheet for Patients:  EntrepreneurPulse.com.au  Fact Sheet for Healthcare Providers:  IncredibleEmployment.be  This test is no t yet approved or cleared by the [place] FDA and  has been authorized for detection and/or diagnosis of SARS-CoV-2 by FDA under an Emergency Use Authorization (EUA). This EUA will remain  in effect (meaning this test can be used) for the duration of the COVID-19 declaration under Section 564(b)(1) of the Act, 21 U.S.C.section 360bbb-3(b)(1), unless the authorization is terminated  or revoked sooner.       Influenza A by PCR NEGATIVE NEGATIVE   Influenza B by PCR NEGATIVE NEGATIVE  Comment: (NOTE) The Xpert Xpress SARS-CoV-2/FLU/RSV plus assay is intended as an aid in the diagnosis of influenza from Nasopharyngeal swab specimens and should not be used as a sole basis for treatment. Nasal washings and aspirates are unacceptable for Xpert Xpress SARS-CoV-2/FLU/RSV testing.  Fact Sheet for Patients: EntrepreneurPulse.com.au  Fact Sheet for Healthcare Providers: IncredibleEmployment.be  This test  is not yet approved or cleared by the [place] FDA and has been authorized for detection and/or diagnosis of SARS-CoV-2 by FDA under an Emergency Use Authorization (EUA). This EUA will remain in effect (meaning this test can be used) for the duration of the COVID-19 declaration under Section 564(b)(1) of the Act, 21 U.S.C. section 360bbb-3(b)(1), unless the authorization is terminated or revoked.  Performed at [facility], [place] [address], [place], [place] [address]   Blood Culture ID Panel (Reflexed)     Status: Abnormal   Collection Time: 09/27/20  2:16 PM  Result Value Ref Range   Enterococcus faecalis NOT DETECTED NOT DETECTED   Enterococcus Faecium NOT DETECTED NOT DETECTED   Listeria monocytogenes NOT DETECTED NOT DETECTED   Staphylococcus species DETECTED (A) NOT DETECTED    Comment: CRITICAL RESULT CALLED TO, READ BACK BY AND VERIFIED WITH: C,PIERCE PHARMD '@1009'$  09/28/20 EB    Staphylococcus aureus (BCID) DETECTED (A) NOT DETECTED    Comment: Methicillin (oxacillin)-resistant Staphylococcus aureus (MRSA). MRSA is predictably resistant to beta-lactam antibiotics (except ceftaroline). Preferred therapy is vancomycin unless clinically contraindicated. Patient requires contact precautions if  hospitalized. CRITICAL RESULT CALLED TO, READ BACK BY AND VERIFIED WITH: C,PIERCE PHARMD '@1009'$  09/28/20 EB    Staphylococcus epidermidis NOT DETECTED NOT DETECTED   Staphylococcus lugdunensis NOT DETECTED NOT DETECTED   Streptococcus species NOT DETECTED NOT DETECTED   Streptococcus agalactiae NOT DETECTED NOT DETECTED   Streptococcus pneumoniae NOT DETECTED NOT DETECTED   Streptococcus pyogenes NOT DETECTED NOT DETECTED   A.calcoaceticus-baumannii NOT DETECTED NOT DETECTED   Bacteroides fragilis NOT DETECTED NOT DETECTED   Enterobacterales NOT DETECTED NOT DETECTED   Enterobacter cloacae complex NOT DETECTED NOT DETECTED   Escherichia coli NOT DETECTED NOT DETECTED    Klebsiella aerogenes NOT DETECTED NOT DETECTED   Klebsiella oxytoca NOT DETECTED NOT DETECTED   Klebsiella pneumoniae NOT DETECTED NOT DETECTED   Proteus species NOT DETECTED NOT DETECTED   Salmonella species NOT DETECTED NOT DETECTED   Serratia marcescens NOT DETECTED NOT DETECTED   Haemophilus influenzae NOT DETECTED NOT DETECTED   Neisseria meningitidis NOT DETECTED NOT DETECTED   Pseudomonas aeruginosa NOT DETECTED NOT DETECTED   Stenotrophomonas maltophilia NOT DETECTED NOT DETECTED   Candida albicans NOT DETECTED NOT DETECTED   Candida auris NOT DETECTED NOT DETECTED   Candida glabrata NOT DETECTED NOT DETECTED   Candida krusei NOT DETECTED NOT DETECTED   Candida parapsilosis NOT DETECTED NOT DETECTED   Candida tropicalis NOT DETECTED NOT DETECTED   Cryptococcus neoformans/gattii NOT DETECTED NOT DETECTED   Meth resistant mecA/C and MREJ DETECTED (A) NOT DETECTED    Comment: CRITICAL RESULT CALLED TO, READ BACK BY AND VERIFIED WITH: C,PIERCE PHARMD '@1009'$  09/28/20 EB Performed at [facility], [address] [place], [place] [address]   Blood Culture (routine x 2)     Status: None (Preliminary result)   Collection Time: 09/27/20  2:29 PM   Specimen: BLOOD  Result Value Ref Range   Specimen Description BLOOD SITE NOT SPECIFIED    Special Requests      BOTTLES DRAWN AEROBIC AND ANAEROBIC Blood Culture results  may not be optimal due to an inadequate volume of blood received in culture bottles   Culture      NO GROWTH < 24 HOURS Performed at [facility] [address] [place], [place] [address]    Report Status PENDING   Lactic acid, plasma     Status: None   Collection Time: 09/27/20  7:00 PM  Result Value Ref Range   Lactic Acid, Venous 1.7 0.5 - 1.9 mmol/L    Comment: Performed at [facility], [place] [address], [place], [place] [address]  Lactic acid, plasma     Status: None   Collection Time: 09/28/20  4:36 AM  Result Value Ref Range   Lactic Acid,  Venous 1.1 0.5 - 1.9 mmol/L    Comment: Performed at [facility] [address], [place], [place] [address]  HIV Antibody (routine testing w rflx)     Status: None   Collection Time: 09/28/20  4:36 AM  Result Value Ref Range   HIV Screen 4th Generation wRfx Non Reactive Non Reactive    Comment: Performed at [facility], [place] [address] [place], [place] [id number]  Basic metabolic panel     Status: Abnormal   Collection Time: 09/28/20  4:36 AM  Result Value Ref Range   Sodium 135 135 - 145 mmol/L   Potassium 5.0 3.5 - 5.1 mmol/L   Chloride 96 (L) 98 - 111 mmol/L   CO2 22 22 - 32 mmol/L   Glucose, Bld 119 (H) 70 - 99 mg/dL    Comment: Glucose reference range applies only to samples taken after fasting for at least 8 hours.   BUN 76 (H) 6 - 20 mg/dL   Creatinine, Ser 11.10 (H) 0.61 - 1.24 mg/dL   Calcium 8.0 (L) 8.9 - 10.3 mg/dL   GFR, Estimated 5 (L) >60 mL/min    Comment: (NOTE) Calculated using the CKD-EPI Creatinine Equation (2021)    Anion gap 17 (H) 5 - 15    Comment: Performed at [facility] [address] [place], [place] [address]  CBC     Status: Abnormal   Collection Time: 09/28/20  4:36 AM  Result Value Ref Range   WBC 29.6 (H) 4.0 - 10.5 K/uL   RBC 3.44 (L) 4.22 - 5.81 MIL/uL   Hemoglobin 9.3 (L) 13.0 - 17.0 g/dL   HCT 28.1 (L) 39.0 - 52.0 %   MCV 81.7 80.0 - 100.0 fL   MCH 27.0 26.0 - 34.0 pg   MCHC 33.1 30.0 - 36.0 g/dL   RDW 14.8 11.5 - 15.5 %   Platelets 88 (L) 150 - 400 K/uL    Comment: SPECIMEN CHECKED FOR CLOTS Immature Platelet Fraction may be clinically indicated, consider ordering this additional test [id number] CONSISTENT WITH PREVIOUS RESULT REPEATED TO VERIFY    nRBC 0.0 0.0 - 0.2 %    Comment: Performed at [facility], [place] [address] [place], [place] [address]  Glucose, capillary     Status: None   Collection Time: 09/28/20  7:21 AM  Result Value Ref Range   Glucose-Capillary 92 70 - 99 mg/dL    Comment: Glucose  reference range applies only to samples taken after fasting for at least 8 hours.  Ammonia     Status: None   Collection Time: 09/28/20  8:26 AM  Result Value Ref Range   Ammonia 27 9 - 35 umol/L    Comment: Performed at  [facility], [place] [address], [place], [place] [address]  Lipid panel     Status: Abnormal   Collection Time: 09/28/20  8:27 AM  Result Value Ref Range   Cholesterol 116 0 - 200 mg/dL   Triglycerides 307 (H) <150 mg/dL   HDL <10 (L) >40 mg/dL    Comment: REPEATED TO VERIFY   Total CHOL/HDL Ratio NOT CALCULATED RATIO   VLDL 61 (H) 0 - 40 mg/dL   LDL Cholesterol NOT CALCULATED 0 - 99 mg/dL    Comment: Performed at [facility] [address], [place], [place] [address]  Hemoglobin A1c     Status: None   Collection Time: 09/28/20  8:27 AM  Result Value Ref Range   Hgb A1c MFr Bld 5.4 4.8 - 5.6 %    Comment: (NOTE) Pre diabetes:          5.7%-6.4%  Diabetes:              >6.4%  Glycemic control for   <7.0% adults with diabetes    Mean Plasma Glucose 108.28 mg/dL    Comment: Performed at [facility] [address], [place], [place] [address]  Glucose, capillary     Status: Abnormal   Collection Time: 09/28/20  9:28 AM  Result Value Ref Range   Glucose-Capillary 103 (H) 70 - 99 mg/dL    Comment: Glucose reference range applies only to samples taken after fasting for at least 8 hours.    MICRO:  IMAGING: CT ABDOMEN PELVIS WO CONTRAST  Result Date: 09/27/2020 CLINICAL DATA:  Abdominal pain.  Chronic renal failure EXAM: CT ABDOMEN AND PELVIS WITHOUT CONTRAST TECHNIQUE: Multidetector CT imaging of the abdomen and pelvis was performed following the standard protocol without IV contrast. COMPARISON:  None. FINDINGS: Lower chest: There is bibasilar atelectatic change. There is no lung base edema or consolidation. There are multiple foci of coronary artery calcification. Hepatobiliary: No focal liver lesions are evident on this noncontrast enhanced  study. Gallbladder wall is not appreciably thickened. There is no biliary duct dilatation. Pancreas: No pancreatic mass or inflammatory focus. Spleen: No splenic lesions are evident. Adrenals/Urinary Tract: Adrenals bilaterally appear unremarkable. Innumerable cysts are noted throughout each kidney, several of which show peripheral calcification. There is a calcified mass arising from the periphery of the lower pole of the left kidney measuring 1.9 x 1.1 cm. Largest well-defined individual cyst is seen arising from the lateral right kidney measuring 2.6 x 2.2 cm. There is no appreciable hydronephrosis on either side. There are scattered suspected 1 mm calculi in each kidney in addition to areas of suspected calcification in the periphery of multiple cysts. There is no ureteral calculus on either side. Urinary bladder is midline with equivocal urinary bladder wall thickening. Stomach/Bowel: There is no appreciable bowel wall or mesenteric thickening. There is no appreciable bowel obstruction. The terminal ileum appears normal. There is no appreciable free air or portal venous air. Vascular/Lymphatic: There is no abdominal aortic aneurysm. There is aortic atherosclerosis. There are also foci of calcification major pelvic and mesenteric arterial vessels. There is no appreciable adenopathy in the abdomen or pelvis. Reproductive: Prostate and seminal vesicles are normal in size and contour. There are calcifications in seminal vesicles bilaterally. Other: Appendix appears normal. No evident abscess or ascites in the abdomen or pelvis. There is slight fat in the umbilicus. Musculoskeletal: Postoperative changes noted in the proximal right femur. Several metallic pellets are noted in the proximal right femur  region. Degenerative change noted in the lumbar spine with vacuum phenomenon at L5-S1 and to a lesser degree at L4-5. No blastic or lytic bone lesions are evident. No intramuscular lesions. IMPRESSION: 1. Findings  indicative of adult polycystic kidney disease. Multiple calcifications are noted in the periphery of several cysts which is typical with this entity. A suspected collapsed cyst with calcification is noted in the periphery of the left kidney measuring 1.9 x 1.1 cm. 2. Scattered 1 mm calculi in each kidney. No hydronephrosis or ureteral calculus on either side. Equivocal thickening of the urinary bladder wall could indicate a degree of cystitis. Correlation with urinalysis in this regard advised. 3. No evident bowel obstruction. No abscess in the abdomen or pelvis. Appendix appears normal. 4. Aortic Atherosclerosis (ICD10-I70.0). Multiple foci of pelvic and mesenteric arterial vascular calcification noted. There are foci of coronary artery calcification. Electronically Signed   By: Lowella Grip III M.D.   On: 09/27/2020 15:25   CT HEAD WO CONTRAST  Result Date: 09/28/2020 CLINICAL DATA:  Delirium. Additional history provided by Dr. Rory Percy: Patient perseverating. EXAM: CT HEAD WITHOUT CONTRAST TECHNIQUE: Contiguous axial images were obtained from the base of the skull through the vertex without intravenous contrast. COMPARISON:  No pertinent prior exams available for comparison. FINDINGS: Brain: Mild cerebral and cerebellar atrophy. Mild patchy and ill-defined hypoattenuation within the cerebral white matter is nonspecific, but compatible with chronic small vessel ischemic disease. 10 mm infarct within the left cerebellar hemisphere, possibly acute (series 2, image 7) (series 4, image 54). There is no acute intracranial hemorrhage. No demarcated cortical infarct. No extra-axial fluid collection. No evidence of intracranial mass. No midline shift. Vascular: No hyperdense vessel.  Atherosclerotic calcifications Skull: Normal. Negative for fracture or focal lesion. Sinuses/Orbits: Visualized orbits show no acute finding. Trace frontal, ethmoid and sphenoid sinus mucosal thickening at the imaged levels. ASPECTS  ([facility] [place] Stroke Program Early CT Score) - Ganglionic level infarction (caudate, lentiform nuclei, internal capsule, insula, M1-M3 cortex): 7 - Supraganglionic infarction (M4-M6 cortex): 3 Total score (0-10 with 10 being normal): 10 These results were called by telephone at the time of interpretation on 09/28/2020 at 8:18 am to provider Dr. Rory Percy, who verbally acknowledged these results. IMPRESSION: 10 mm infarct within the medial left cerebellar hemisphere, which may be acute. Consider brain MRI for further evaluation. No acute intracranial hemorrhage or acute demarcated cortical infarct. Background mild generalized atrophy of the brain and chronic small vessel ischemic disease. Electronically Signed   By: Kellie Simmering DO   On: 09/28/2020 08:20   MR ANGIO HEAD WO CONTRAST  Result Date: 09/28/2020 CLINICAL DATA:  Neuro deficit, acute, stroke suspected. EXAM: MRI HEAD WITHOUT CONTRAST MRA HEAD WITHOUT CONTRAST TECHNIQUE: Multiplanar, multiecho pulse sequences of the brain and surrounding structures were obtained without intravenous contrast. Angiographic images of the head were obtained using MRA technique without contrast. COMPARISON:  Noncontrast head CT performed earlier today 09/28/2020. FINDINGS: MRI HEAD FINDINGS Brain: Mild cerebral and cerebellar atrophy. There are scattered small acute cortical and subcortical infarcts within the bilateral frontal, bilateral parietal, bilateral occipital and left temporal lobes. Small acute infarcts are also present within the midline callosum splenium, right caudate nucleus and bilateral cerebellar hemispheres. The largest acute infarct is present cortical/subcortical right frontal lobe and measures 2.3 x 0.5 cm (series 5, image 21). 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 A few small scattered supratentorial chronic microhemorrhages are also present. No evidence of intracranial mass. No extra-axial fluid collection.  No midline shift. Vascular: Expected proximal arterial flow voids. Skull and upper cervical spine: No focal marrow lesion. Sinuses/Orbits: Visualized orbits show no acute finding. Mild bilateral ethmoid sinus mucosal thickening. MRA HEAD FINDINGS The intracranial internal carotid arteries are patent. The M1 middle cerebral arteries are patent. No M2 proximal branch occlusion or high-grade proximal stenosis is identified. The anterior cerebral arteries are patent. The intracranial vertebral arteries are patent. The basilar artery is patent. The posterior cerebral arteries are patent. Posterior communicating arteries are present bilaterally. 2 mm infundibulum at the origin of the left posterior communicating artery. MRI head Impression #1 will be called to the ordering clinician or representative by the Radiologist Assistant, and communication documented in the PACS or [employer]. IMPRESSION: MRI head: 1. 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 Multiple vascular territories are involved and findings are highly suspicious for an embolic process. 2. 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 A few scattered supratentorial chronic microhemorrhages are also present. 3. Mild generalized atrophy of the brain. MRA head: No intracranial large vessel occlusion or proximal high-grade arterial stenosis. Electronically Signed   By: Kellie Simmering DO   On: 09/28/2020 09:41   MR ANGIO NECK WO CONTRAST  Result Date: 09/28/2020 CLINICAL DATA:  Neuro deficit, acute, stroke suspected. EXAM: MRA NECK WITHOUT CONTRAST TECHNIQUE: Angiographic images of the neck were obtained using MRA technique without intravenous contrast. Carotid stenosis measurements (when applicable) are obtained utilizing NASCET criteria, using the distal internal carotid diameter as the denominator.  COMPARISON:  Non-contrast head CT performed earlier today 09/28/2020. FINDINGS: The origins of the common carotid and vertebral arteries are excluded from the field of view. The visualized common carotid arteries and internal carotid arteries are patent within the neck without appreciable stenosis. The visualized vertebral arteries are patent within the neck without appreciable stenosis. IMPRESSION: The visualized common carotid, internal carotid and vertebral arteries patent within the neck without appreciable stenosis. Electronically Signed   By: Kellie Simmering DO   On: 09/28/2020 09:53   MR BRAIN WO CONTRAST  Result Date: 09/28/2020 CLINICAL DATA:  Neuro deficit, acute, stroke suspected. EXAM: MRI HEAD WITHOUT CONTRAST MRA HEAD WITHOUT CONTRAST TECHNIQUE: Multiplanar, multiecho pulse sequences of the brain and surrounding structures were obtained without intravenous contrast. Angiographic images of the head were obtained using MRA technique without contrast. COMPARISON:  Noncontrast head CT performed earlier today 09/28/2020. FINDINGS: MRI HEAD FINDINGS Brain: Mild cerebral and cerebellar atrophy. There are scattered small acute cortical and subcortical infarcts within the bilateral frontal, bilateral parietal, bilateral occipital and left temporal lobes. Small acute infarcts are also present within the midline callosum splenium, right caudate nucleus and bilateral cerebellar hemispheres. The largest acute infarct is present cortical/subcortical right frontal lobe and measures 2.3 x 0.5 cm (series 5, image 21). 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 A few small scattered supratentorial chronic microhemorrhages are also present. No evidence of intracranial mass. No extra-axial fluid collection. No midline shift. Vascular: Expected proximal arterial flow voids. Skull and upper cervical spine: No focal marrow lesion. Sinuses/Orbits: Visualized orbits show no  acute finding. Mild bilateral ethmoid sinus mucosal thickening. MRA HEAD FINDINGS The intracranial internal carotid arteries are patent. The M1 middle cerebral arteries are patent. No M2 proximal branch occlusion or high-grade proximal stenosis is identified. The anterior cerebral arteries are patent. The intracranial vertebral arteries are patent. The basilar artery  is patent. The posterior cerebral arteries are patent. Posterior communicating arteries are present bilaterally. 2 mm infundibulum at the origin of the left posterior communicating artery. MRI head Impression #1 will be called to the ordering clinician or representative by the Radiologist Assistant, and communication documented in the PACS or [employer]. IMPRESSION: MRI head: 1. 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 Multiple vascular territories are involved and findings are highly suspicious for an embolic process. 2. 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 A few scattered supratentorial chronic microhemorrhages are also present. 3. Mild generalized atrophy of the brain. MRA head: No intracranial large vessel occlusion or proximal high-grade arterial stenosis. Electronically Signed   By: Kellie Simmering DO   On: 09/28/2020 09:41   DG Chest Port 1 View  Result Date: 09/27/2020 CLINICAL DATA:  Questionable sepsis evaluate for abnormality. Presenting from dialysis with acute complaint of body aches, chills and shortness of breath EXAM: PORTABLE CHEST 1 VIEW COMPARISON:  Shoulder radiograph September 17, 2020 and chest radiograph August 29, 2013 FINDINGS: Cardiomegaly. Low lung volumes. Streaky left basilar opacities. No significant pleural effusion or visible pneumothorax. Left vascular stent. Similar appearance of the mass like calcific density over the right shoulder. There is hazy  calcification along the left shoulder. IMPRESSION: 1. Low lung volumes with streaky left basilar opacities which could represent atelectasis or infection. 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 Continue to monitor on physical exam

## 2020-09-28 NOTE — Progress Notes (Signed)
MRI  Brain and MRA head and MRA neck without contrast completed and formally read  IMPRESSION: MRI head:  1. 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 Multiple vascular territories are involved and findings are highly suspicious for an embolic process. 2. 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 A few scattered supratentorial chronic microhemorrhages are also present. 3. Mild generalized atrophy of the brain.  MRA head:  No intracranial large vessel occlusion or proximal high-grade arterial stenosis.  MRA Neck IMPRESSION: The visualized common carotid, internal carotid and vertebral arteries patent within the neck without appreciable stenosis.

## 2020-09-28 NOTE — Consult Note (Signed)
[facility] Renal Consultation Note    Indication for Consultation:  Management of ESRD/hemodialysis; anemia, hypertension/volume and secondary hyperparathyroidism  [id number], Myra Rude, MD  HPI: Patrick Anthony is a 50 y.o. male with ESRD on HD MWF at [facility], first starting in 08/2013.  Past medical history significant for HTN, hep C with cirrhosis, HTN and medication non compliance.  Patient seen and examined at bedside.  History obtained through chart review due to patient current mental status.  Tracking some but mostly trembling and answering "yeah" to all questions.  Seen by provider yesterday during dialysis and noted to have fever, chills, and O2 say 84%.  Blood cultures collected at HD and given 1 dose vanc/cefepime, then sent to ED after dialysis.  In the ED reported generalized weakness, decreased energy, tachycardia, fever chills, n/v, abdominal pain, chest pain and SOB starting about 10 days ago.  Patient was febrile with tmax 103.34F, tachycardic, lactic acid 3.5, WBC 29.6, CXR showed possible PNA.  CT abdomen with no acute abnormalities.  He was admitted for further evaluation and management.  This AM rapid response was called when nurses noted patient confused, only able to verbalize "yeah" and with new tremor.  Code stroke called, CT head with possible acute infarct with L cerebral hemisphere, stat MRI completed with showed scattered small infarcts within b/l cerebral hemispheres, R basal ganglia and callosal splenium. Multiple vascular territories involved and embolic process suspected. Additional findings during work up include +staph aureus bacteremia.  Patient has been admitted for further evaluation and management.   Past Medical History:  Diagnosis Date  . Chronic kidney disease    patient on dialysis tues-thurs-sat  . Cirrhosis of liver ([place])   . Drug addiction ([place])   . ETOH abuse   . Hepatitis   . Hypertension   . Renal disorder renal failure  . Sleep apnea    . Systolic and diastolic CHF, 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[place]) 04/19/2013   Past Surgical History:  Procedure Laterality Date  . AV FISTULA PLACEMENT Right   . AV FISTULA PLACEMENT Left 09/03/2013   Procedure: ARTERIOVENOUS (AV) FISTULA CREATION - LEFT BRACHIOCEPHALIC;  Surgeon: Conrad Bressler, MD;  Location: [name] OR;  Service: Vascular;  Laterality: Left;  . AV FISTULA PLACEMENT Left 10/30/2019   Procedure: INSERTION OF ARTERIOVENOUS (AV) GORE-TEX GRAFT ARM;  Surgeon: Rosetta Posner, MD;  Location: [place];  Service: Vascular;  Laterality: Left;  . INSERTION OF DIALYSIS CATHETER N/A 08/29/2013   Procedure: INSERTION OF DIALYSIS CATHETER;  Surgeon: Mal Misty, MD;  Location: [place];  Service: Vascular;  Laterality: N/A;  . INSERTION OF DIALYSIS CATHETER Left 10/30/2019   Procedure: INSERTION OF DIALYSIS CATHETER LEFT INTERNAL JUGULAR;  Surgeon: Rosetta Posner, MD;  Location: [place];  Service: Vascular;  Laterality: Left;  . PERIPHERAL VASCULAR CATHETERIZATION N/A 01/06/2015   Procedure: A/V Shuntogram/Fistulagram;  Surgeon: Katha Cabal, MD;  Location: [place] [facility];  Service: Cardiovascular;  Laterality: N/A;  . PERIPHERAL VASCULAR CATHETERIZATION Left 01/06/2015   Procedure: A/V Shunt Intervention;  Surgeon: Katha Cabal, MD;  Location: [place] [facility];  Service: Cardiovascular;  Laterality: Left;   Family History  Problem Relation Age of Onset  . Hypertension Mother   . Cancer Mother        breast   Social History:  reports that he has been smoking cigarettes. He has been smoking about 0.20 packs per day. He has never used smokeless tobacco. He reports that he does  not drink alcohol and does not use drugs. No Known Allergies Prior to Admission medications   Medication Sig Start Date End Date Taking? Authorizing Provider  amLODipine (NORVASC) 10 MG tablet Take 10 mg by mouth every morning. 09/24/20  Yes [provider]  AURYXIA 1 GM 210 MG(Fe) tablet Take 420 mg by mouth 3  (three) times daily. 07/20/20  Yes [provider]  calcium acetate (PHOSLO) 667 MG capsule Take 3 capsules (2,001 mg total) by mouth 3 (three) times daily with meals. Patient taking differently: Take 667-2,001 mg by mouth See admin instructions. Take 3 capsules (2001 mg) by mouth with each meal & take 1 capsule (667 mg) by mouth with each snack 09/04/13  Yes Ivor Costa, MD  cloNIDine (CATAPRES) 0.3 MG tablet Take 0.3 mg by mouth in the morning, at noon, and at bedtime.   Yes [provider]  Darbepoetin Alfa (ARANESP, ALBUMIN FREE, IJ) Darbepoetin Alfa (Aranesp) 07/05/20 07/04/21 Yes [provider]  gabapentin (NEURONTIN) 100 MG capsule Take 200 mg by mouth at bedtime.   Yes [provider]  heparin 1000 unit/mL SOLN injection Heparin Sodium (Porcine) 1,000 Units/mL Systemic 04/23/20 04/22/21 Yes [provider]  hydrALAZINE (APRESOLINE) 100 MG tablet Take 100 mg by mouth 2 (two) times daily. 05/11/20  Yes [provider]  metoprolol tartrate (LOPRESSOR) 25 MG tablet Take 75 mg by mouth 2 (two) times daily.  04/12/16  Yes [provider]  nitroGLYCERIN (NITROSTAT) 0.4 MG SL tablet Place 0.4 mg under the tongue every 5 (five) minutes x 3 doses as needed for chest pain.    Yes [provider]  SENSIPAR 30 MG tablet Take 30 mg by mouth daily. 09/15/19  Yes [provider]  VIAGRA 50 MG tablet Take 50 mg by mouth daily as needed for erectile dysfunction.  09/24/19  Yes [provider]  oxyCODONE-acetaminophen (PERCOCET) 7.5-325 MG tablet Take 1 tablet by mouth every 4 (four) hours as needed for severe pain. Patient not taking: No sig reported 10/30/19 10/29/20  Karoline Caldwell, PA-C   Current Facility-Administered Medications  Medication Dose Route Frequency Provider Last Rate Last Admin  . acetaminophen (TYLENOL) tablet 650 mg  650 mg Oral Q4H PRN Oswald Hillock, MD       Or  . acetaminophen (TYLENOL) 160 MG/5ML solution  650 mg  650 mg Per Tube Q4H PRN Oswald Hillock, MD       Or  . acetaminophen (TYLENOL) suppository 650 mg  650 mg Rectal Q4H PRN Oswald Hillock, MD   650 mg at 09/28/20 1239  . albuterol (PROVENTIL) (2.5 MG/3ML) 0.083% nebulizer solution 2.5 mg  2.5 mg Nebulization Q2H PRN Poplawski, Rafal, MD      . calcium acetate (PHOSLO) capsule 667 mg  667 mg Oral TID WC Poplawski, Rafal, MD      . calcium acetate (PHOSLO) capsule 667 mg  667 mg Oral With snacks Poplawski, Rafal, MD      . cinacalcet (SENSIPAR) tablet 30 mg  30 mg Oral Q breakfast Poplawski, Rafal, MD      . cloNIDine (CATAPRES) tablet 0.3 mg  0.3 mg Oral TID Poplawski, Rafal, MD   0.3 mg at 09/27/20 2105  . gabapentin (NEURONTIN) capsule 200 mg  200 mg Oral QHS Poplawski, Rafal, MD   200 mg at 09/27/20 2104  . LORazepam (ATIVAN) injection 2 mg  2 mg Intravenous Once [name], MD      . ondansetron [facility]) tablet 4  mg  4 mg Oral Q6H PRN Poplawski, Rafal, MD   4 mg at 09/27/20 2104   Or  . ondansetron (ZOFRAN) injection 4 mg  4 mg Intravenous Q6H PRN Poplawski, Rafal, MD      . oxyCODONE (Oxy IR/ROXICODONE) immediate release tablet 5 mg  5 mg Oral Q4H PRN Poplawski, Rafal, MD   5 mg at 09/27/20 2104  . polyethylene glycol (MIRALAX / GLYCOLAX) packet 17 g  17 g Oral Daily PRN Poplawski, Rafal, MD      . Derrill Memo ON 09/29/2020] vancomycin (VANCOCIN) IVPB 1000 mg/200 mL premix  1,000 mg Intravenous Q M,W,F-HD Poplawski, Rafal, MD       Facility-Administered Medications Ordered in Other Encounters  Medication Dose Route Frequency Provider Last Rate Last Admin  . regadenoson (LEXISCAN) injection SOLN 0.4 mg  0.4 mg Intravenous Once Cherlynn Kaiser A, MD      . technetium tetrofosmin (TC-MYOVIEW) injection [id number] millicurie  [id number] millicurie Intravenous Once PRN Cherlynn Kaiser A, MD      . technetium tetrofosmin (TC-MYOVIEW) injection [id number] millicurie  [id number] millicurie Intravenous Once PRN Elouise Munroe, MD       Labs: Basic Metabolic  Panel: Recent Labs  Lab 09/27/20 1125 09/27/20 1416 09/28/20 0436  NA 137 137 135  K 4.4 4.6 5.0  CL 92* 92* 96*  CO2 '25 23 22  '$ GLUCOSE 86 93 119*  BUN 45* 48* 76*  CREATININE 9.82* 10.30* 11.10*  CALCIUM 8.8* 8.6* 8.0*   Liver Function Tests: Recent Labs  Lab 09/27/20 1416  AST 42*  ALT 15  ALKPHOS 107  BILITOT 2.1*  PROT 8.4*  ALBUMIN 2.9*    Recent Labs  Lab 09/28/20 0826  AMMONIA 27   CBC: Recent Labs  Lab 09/27/20 1125 09/28/20 0436  WBC 23.2* 29.6*  HGB 11.0* 9.3*  HCT 32.6* 28.1*  MCV 82.3 81.7  PLT 83* 88*    CBG: Recent Labs  Lab 09/28/20 0721 09/28/20 0928  GLUCAP 92 103*   Studies/Results: CT ABDOMEN PELVIS WO CONTRAST  Result Date: 09/27/2020 CLINICAL DATA:  Abdominal pain.  Chronic renal failure EXAM: CT ABDOMEN AND PELVIS WITHOUT CONTRAST TECHNIQUE: Multidetector CT imaging of the abdomen and pelvis was performed following the standard protocol without IV contrast. COMPARISON:  None. FINDINGS: Lower chest: There is bibasilar atelectatic change. There is no lung base edema or consolidation. There are multiple foci of coronary artery calcification. Hepatobiliary: No focal liver lesions are evident on this noncontrast enhanced study. Gallbladder wall is not appreciably thickened. There is no biliary duct dilatation. Pancreas: No pancreatic mass or inflammatory focus. Spleen: No splenic lesions are evident. Adrenals/Urinary Tract: Adrenals bilaterally appear unremarkable. Innumerable cysts are noted throughout each kidney, several of which show peripheral calcification. There is a calcified mass arising from the periphery of the lower pole of the left kidney measuring 1.9 x 1.1 cm. Largest well-defined individual cyst is seen arising from the lateral right kidney measuring 2.6 x 2.2 cm. There is no appreciable hydronephrosis on either side. There are scattered suspected 1 mm calculi in each kidney in addition to areas of suspected calcification in the  periphery of multiple cysts. There is no ureteral calculus on either side. Urinary bladder is midline with equivocal urinary bladder wall thickening. Stomach/Bowel: There is no appreciable bowel wall or mesenteric thickening. There is no appreciable bowel obstruction. The terminal ileum appears normal. There is no appreciable free air or portal venous air. Vascular/Lymphatic: There is no abdominal aortic aneurysm. There is  aortic atherosclerosis. There are also foci of calcification major pelvic and mesenteric arterial vessels. There is no appreciable adenopathy in the abdomen or pelvis. Reproductive: Prostate and seminal vesicles are normal in size and contour. There are calcifications in seminal vesicles bilaterally. Other: Appendix appears normal. No evident abscess or ascites in the abdomen or pelvis. There is slight fat in the umbilicus. Musculoskeletal: Postoperative changes noted in the proximal right femur. Several metallic pellets are noted in the proximal right femur region. Degenerative change noted in the lumbar spine with vacuum phenomenon at L5-S1 and to a lesser degree at L4-5. No blastic or lytic bone lesions are evident. No intramuscular lesions. IMPRESSION: 1. Findings indicative of adult polycystic kidney disease. Multiple calcifications are noted in the periphery of several cysts which is typical with this entity. A suspected collapsed cyst with calcification is noted in the periphery of the left kidney measuring 1.9 x 1.1 cm. 2. Scattered 1 mm calculi in each kidney. No hydronephrosis or ureteral calculus on either side. Equivocal thickening of the urinary bladder wall could indicate a degree of cystitis. Correlation with urinalysis in this regard advised. 3. No evident bowel obstruction. No abscess in the abdomen or pelvis. Appendix appears normal. 4. Aortic Atherosclerosis (ICD10-I70.0). Multiple foci of pelvic and mesenteric arterial vascular calcification noted. There are foci of coronary  artery calcification. Electronically Signed   By: Lowella Grip III M.D.   On: 09/27/2020 15:25   CT HEAD WO CONTRAST  Result Date: 09/28/2020 CLINICAL DATA:  Delirium. Additional history provided by Dr. Rory Percy: Patient perseverating. EXAM: CT HEAD WITHOUT CONTRAST TECHNIQUE: Contiguous axial images were obtained from the base of the skull through the vertex without intravenous contrast. COMPARISON:  No pertinent prior exams available for comparison. FINDINGS: Brain: Mild cerebral and cerebellar atrophy. Mild patchy and ill-defined hypoattenuation within the cerebral white matter is nonspecific, but compatible with chronic small vessel ischemic disease. 10 mm infarct within the left cerebellar hemisphere, possibly acute (series 2, image 7) (series 4, image 54). There is no acute intracranial hemorrhage. No demarcated cortical infarct. No extra-axial fluid collection. No evidence of intracranial mass. No midline shift. Vascular: No hyperdense vessel.  Atherosclerotic calcifications Skull: Normal. Negative for fracture or focal lesion. Sinuses/Orbits: Visualized orbits show no acute finding. Trace frontal, ethmoid and sphenoid sinus mucosal thickening at the imaged levels. ASPECTS ([place] Stroke Program Early CT Score) - Ganglionic level infarction (caudate, lentiform nuclei, internal capsule, insula, M1-M3 cortex): 7 - Supraganglionic infarction (M4-M6 cortex): 3 Total score (0-10 with 10 being normal): 10 These results were called by telephone at the time of interpretation on 09/28/2020 at 8:18 am to provider Dr. Rory Percy, who verbally acknowledged these results. IMPRESSION: 10 mm infarct within the medial left cerebellar hemisphere, which may be acute. Consider brain MRI for further evaluation. No acute intracranial hemorrhage or acute demarcated cortical infarct. Background mild generalized atrophy of the brain and chronic small vessel ischemic disease. Electronically Signed   By: Kellie Simmering DO   On:  09/28/2020 08:20   MR ANGIO HEAD WO CONTRAST  Result Date: 09/28/2020 CLINICAL DATA:  Neuro deficit, acute, stroke suspected. EXAM: MRI HEAD WITHOUT CONTRAST MRA HEAD WITHOUT CONTRAST TECHNIQUE: Multiplanar, multiecho pulse sequences of the brain and surrounding structures were obtained without intravenous contrast. Angiographic images of the head were obtained using MRA technique without contrast. COMPARISON:  Noncontrast head CT performed earlier today 09/28/2020. FINDINGS: MRI HEAD FINDINGS Brain: Mild cerebral and cerebellar atrophy. There are scattered small acute cortical and subcortical  infarcts within the bilateral frontal, bilateral parietal, bilateral occipital and left temporal lobes. Small acute infarcts are also present within the midline callosum splenium, right caudate nucleus and bilateral cerebellar hemispheres. The largest acute infarct is present cortical/subcortical right frontal lobe and measures 2.3 x 0.5 cm (series 5, image 21). 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 A few small scattered supratentorial chronic microhemorrhages are also present. No evidence of intracranial mass. No extra-axial fluid collection. No midline shift. Vascular: Expected proximal arterial flow voids. Skull and upper cervical spine: No focal marrow lesion. Sinuses/Orbits: Visualized orbits show no acute finding. Mild bilateral ethmoid sinus mucosal thickening. MRA HEAD FINDINGS The intracranial internal carotid arteries are patent. The M1 middle cerebral arteries are patent. No M2 proximal branch occlusion or high-grade proximal stenosis is identified. The anterior cerebral arteries are patent. The intracranial vertebral arteries are patent. The basilar artery is patent. The posterior cerebral arteries are patent. Posterior communicating arteries are present bilaterally. 2 mm infundibulum at the origin of the left posterior communicating artery. MRI head  Impression #1 will be called to the ordering clinician or representative by the Radiologist Assistant, and communication documented in the PACS or [employer]. IMPRESSION: MRI head: 1. 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 Multiple vascular territories are involved and findings are highly suspicious for an embolic process. 2. 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 A few scattered supratentorial chronic microhemorrhages are also present. 3. Mild generalized atrophy of the brain. MRA head: No intracranial large vessel occlusion or proximal high-grade arterial stenosis. Electronically Signed   By: Kellie Simmering DO   On: 09/28/2020 09:41   MR ANGIO NECK WO CONTRAST  Result Date: 09/28/2020 CLINICAL DATA:  Neuro deficit, acute, stroke suspected. EXAM: MRA NECK WITHOUT CONTRAST TECHNIQUE: Angiographic images of the neck were obtained using MRA technique without intravenous contrast. Carotid stenosis measurements (when applicable) are obtained utilizing NASCET criteria, using the distal internal carotid diameter as the denominator. COMPARISON:  Non-contrast head CT performed earlier today 09/28/2020. FINDINGS: The origins of the common carotid and vertebral arteries are excluded from the field of view. The visualized common carotid arteries and internal carotid arteries are patent within the neck without appreciable stenosis. The visualized vertebral arteries are patent within the neck without appreciable stenosis. IMPRESSION: The visualized common carotid, internal carotid and vertebral arteries patent within the neck without appreciable stenosis. Electronically Signed   By: Kellie Simmering DO   On: 09/28/2020 09:53   MR BRAIN WO CONTRAST  Result Date: 09/28/2020 CLINICAL DATA:  Neuro deficit, acute, stroke suspected. EXAM: MRI HEAD WITHOUT  CONTRAST MRA HEAD WITHOUT CONTRAST TECHNIQUE: Multiplanar, multiecho pulse sequences of the brain and surrounding structures were obtained without intravenous contrast. Angiographic images of the head were obtained using MRA technique without contrast. COMPARISON:  Noncontrast head CT performed earlier today 09/28/2020. FINDINGS: MRI HEAD FINDINGS Brain: Mild cerebral and cerebellar atrophy. There are scattered small acute cortical and subcortical infarcts within the bilateral frontal, bilateral parietal, bilateral occipital and left temporal lobes. Small acute infarcts are also present within the midline callosum splenium, right caudate nucleus and bilateral cerebellar hemispheres. The largest acute infarct is present cortical/subcortical right frontal lobe and measures 2.3 x 0.5 cm (series 5, image 21). 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 A few small scattered supratentorial chronic microhemorrhages are also present. No  evidence of intracranial mass. No extra-axial fluid collection. No midline shift. Vascular: Expected proximal arterial flow voids. Skull and upper cervical spine: No focal marrow lesion. Sinuses/Orbits: Visualized orbits show no acute finding. Mild bilateral ethmoid sinus mucosal thickening. MRA HEAD FINDINGS The intracranial internal carotid arteries are patent. The M1 middle cerebral arteries are patent. No M2 proximal branch occlusion or high-grade proximal stenosis is identified. The anterior cerebral arteries are patent. The intracranial vertebral arteries are patent. The basilar artery is patent. The posterior cerebral arteries are patent. Posterior communicating arteries are present bilaterally. 2 mm infundibulum at the origin of the left posterior communicating artery. MRI head Impression #1 will be called to the ordering clinician or representative by the Radiologist Assistant, and communication documented in the PACS or [employer]. IMPRESSION: MRI head: 1. 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 Multiple vascular territories are involved and findings are highly suspicious for an embolic process. 2. 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 A few scattered supratentorial chronic microhemorrhages are also present. 3. Mild generalized atrophy of the brain. MRA head: No intracranial large vessel occlusion or proximal high-grade arterial stenosis. Electronically Signed   By: Kellie Simmering DO   On: 09/28/2020 09:41   DG Chest Port 1 View  Result Date: 09/27/2020 CLINICAL DATA:  Questionable sepsis evaluate for abnormality. Presenting from dialysis with acute complaint of body aches, chills and shortness of breath EXAM: PORTABLE CHEST 1 VIEW COMPARISON:  Shoulder radiograph September 17, 2020 and chest radiograph August 29, 2013 FINDINGS: Cardiomegaly. Low lung volumes. Streaky left basilar opacities. No significant pleural effusion or visible pneumothorax. Left vascular stent. Similar appearance of the mass like calcific density over the right shoulder. There is hazy calcification along the left shoulder. IMPRESSION: 1. Low lung volumes with streaky left basilar opacities which could represent atelectasis or infection. Electronically Signed   By: Dahlia Bailiff MD   On: 09/27/2020 14:59    ROS: Unable to complete ROS due to patient current mental status.   Physical Exam: Vitals:   09/28/20 0929 09/28/20 1032 09/28/20 1223 09/28/20 1406  BP: 124/87 (!) 159/122 (!) 151/103 (!) 134/92  Pulse: 88 88 94 (!) 101  Resp: (!) 30 (!) 22 (!) 24   Temp: 98.3 F (36.8 C) 99.4 F (37.4 C) (!) 100.6 F (38.1 C) 100.3 F (37.9 C)  TempSrc: Oral Oral Oral Oral  SpO2: 100% 97% 99% 96%  Weight:      Height:         General: WDWN male in NAD, +tremor, +tracking Head: NCAT  sclera not icteric MMM Neck: Supple. Lungs: CTA bilaterally. No wheeze, rales or rhonchi. Breathing is unlabored. Heart: RRR. 1/6 systolic murmur, No rubs or gallops.  Abdomen: soft, +BS, no guarding, no rebound tenderness Lower extremities:no edema, ischemic changes, or open wounds  Neuro: Alert. Moves all extremities spontaneously.. Dialysis Access: LU AVG +b/t  Dialysis Orders:  MWF - [place]  4hrs, BFR 350, DFR 500,  EDW 82.5kg, 2K/ 2.5Ca, P1  Access: LU AVF  Heparin 5500 unit bolus Aranesp 27mg q2wks - last 09/27/20 Hectorol 353m IV qHD    Assessment/Plan: 1.  Acute embolic stroke - out of window for TPA.  ECHO pending to assess for endocarditis.  Per neuro 2. MRSA bacteremia - BC+, empiric ABX started and now narrowed to vancomycin for 4-6 weeks per ID.  TEE pending.   3.  ESRD -  On HD MWF.  Orders written for HD tomorrow per regular schedule.  4.  Hypertension/volume  - Blood pressure chronically hard to control.  Continue home meds.  Does not appear grossly volume overloaded on exam.  UF as tolerated.  5.  Anemia of CKD - Hgb 9.3.  ESA recently dosed.  6.  Secondary Hyperparathyroidism -  Ca in goal.  Will check phos.  Continue VDRA, sensipar and binders once eating.  7.  Nutrition - Renal diet w/fluid restrictions once advanced.  8. Hx Hep C /liver cirrhosis - per ID 9. Chronic diastolic HF  Jen Mow, PA-C [facility] 09/28/2020, 3:39 PM

## 2020-09-28 NOTE — Procedures (Addendum)
Patient Name: Patrick Anthony  MRN: [id number]  Epilepsy Attending: [name]  Referring Physician/Provider: Dr Amie Portland Date: 09/28/2020 Duration: 30.40 mins  Patient history: 50yo M with ams. EEG to evaluate for seizure.   Level of alertness:  lethargic  AEDs during EEG study: GBP  Technical aspects: This EEG study was done with scalp electrodes positioned according to the 10-20 International system of electrode placement. Electrical activity was acquired at a sampling rate of '500Hz'$  and reviewed with a high frequency filter of '70Hz'$  and a low frequency filter of '1Hz'$ . EEG data were recorded continuously and digitally stored.   Description: EEG initially showed periodic discharges with triphasic morphology at 2-2.'5hz'$  were also noted. IV ativan '2mg'$  was administered after which EEG showed continuous  generalized 3 to 6 Hz theta-delta slowing.   One episode of right arm tremor was noted at 1528 . Concomitant eeg before, during and after the event showed generalized periodic discharges with triphasic morphology at 2-2.5 without definite evolution.    Hyperventilation and photic stimulation were not performed.     ABNORMALITY - Periodic discharges with triphasic morphology, generalized -Continuous slow, generalized  IMPRESSION: This study initially showed generalized periodic discharges with triphasic morphology at 2-2.'5hz'$ . This eeg pattern can be on the ictal-interictal continuum and also seen with toxic-metabolic etiology. EEG improved after IV ativan which although not confirmatory is concerning for ictal nature of this pattern. There is also moderate diffuse encephalopathy, non specific etiology.   One episode of right arm tremor was noted at 1528 during which eeg showed generalized periodic discharges with triphasic morphology at 2-2.5 without definite evolution. However, focal motor seizures may not be seen on scalp eeg. Therefore, clinical correlation is recommended.     Florentina Marquart Barbra Sarks

## 2020-09-29 ENCOUNTER — Inpatient Hospital Stay (HOSPITAL_COMMUNITY): Payer: Medicaid Other

## 2020-09-29 DIAGNOSIS — J984 Other disorders of lung: Secondary | ICD-10-CM | POA: Diagnosis not present

## 2020-09-29 DIAGNOSIS — Z20822 Contact with and (suspected) exposure to covid-19: Secondary | ICD-10-CM | POA: Diagnosis not present

## 2020-09-29 DIAGNOSIS — R29711 NIHSS score 11: Secondary | ICD-10-CM | POA: Diagnosis not present

## 2020-09-29 DIAGNOSIS — Z992 Dependence on renal dialysis: Secondary | ICD-10-CM | POA: Diagnosis not present

## 2020-09-29 DIAGNOSIS — N186 End stage renal disease: Secondary | ICD-10-CM | POA: Diagnosis not present

## 2020-09-29 DIAGNOSIS — I6340 Cerebral infarction due to embolism of unspecified cerebral artery: Secondary | ICD-10-CM | POA: Diagnosis not present

## 2020-09-29 DIAGNOSIS — I5032 Chronic diastolic (congestive) heart failure: Secondary | ICD-10-CM | POA: Diagnosis not present

## 2020-09-29 DIAGNOSIS — R109 Unspecified abdominal pain: Secondary | ICD-10-CM | POA: Diagnosis not present

## 2020-09-29 DIAGNOSIS — A4102 Sepsis due to Methicillin resistant Staphylococcus aureus: Secondary | ICD-10-CM | POA: Diagnosis not present

## 2020-09-29 DIAGNOSIS — I639 Cerebral infarction, unspecified: Secondary | ICD-10-CM | POA: Diagnosis not present

## 2020-09-29 DIAGNOSIS — I132 Hypertensive heart and chronic kidney disease with heart failure and with stage 5 chronic kidney disease, or end stage renal disease: Secondary | ICD-10-CM | POA: Diagnosis not present

## 2020-09-29 DIAGNOSIS — I330 Acute and subacute infective endocarditis: Secondary | ICD-10-CM | POA: Diagnosis not present

## 2020-09-29 DIAGNOSIS — J189 Pneumonia, unspecified organism: Secondary | ICD-10-CM | POA: Diagnosis not present

## 2020-09-29 DIAGNOSIS — B9561 Methicillin susceptible Staphylococcus aureus infection as the cause of diseases classified elsewhere: Secondary | ICD-10-CM | POA: Diagnosis not present

## 2020-09-29 DIAGNOSIS — I2690 Septic pulmonary embolism without acute cor pulmonale: Secondary | ICD-10-CM | POA: Diagnosis not present

## 2020-09-29 DIAGNOSIS — R29716 NIHSS score 16: Secondary | ICD-10-CM | POA: Diagnosis not present

## 2020-09-29 DIAGNOSIS — R7881 Bacteremia: Secondary | ICD-10-CM | POA: Diagnosis present

## 2020-09-29 DIAGNOSIS — G928 Other toxic encephalopathy: Secondary | ICD-10-CM | POA: Diagnosis not present

## 2020-09-29 DIAGNOSIS — N2581 Secondary hyperparathyroidism of renal origin: Secondary | ICD-10-CM | POA: Diagnosis not present

## 2020-09-29 DIAGNOSIS — R0602 Shortness of breath: Secondary | ICD-10-CM | POA: Diagnosis not present

## 2020-09-29 DIAGNOSIS — J9811 Atelectasis: Secondary | ICD-10-CM | POA: Diagnosis not present

## 2020-09-29 DIAGNOSIS — A419 Sepsis, unspecified organism: Secondary | ICD-10-CM | POA: Diagnosis not present

## 2020-09-29 DIAGNOSIS — R4182 Altered mental status, unspecified: Secondary | ICD-10-CM | POA: Diagnosis not present

## 2020-09-29 DIAGNOSIS — R29712 NIHSS score 12: Secondary | ICD-10-CM | POA: Diagnosis not present

## 2020-09-29 DIAGNOSIS — R29718 NIHSS score 18: Secondary | ICD-10-CM | POA: Diagnosis not present

## 2020-09-29 DIAGNOSIS — R531 Weakness: Secondary | ICD-10-CM | POA: Diagnosis not present

## 2020-09-29 HISTORY — DX: Other disorders of lung: J98.4

## 2020-09-29 LAB — HEMOGLOBIN A1C
Hgb A1c MFr Bld: 5.5 % (ref 4.8–5.6)
Mean Plasma Glucose: 111.15 mg/dL

## 2020-09-29 LAB — LIPID PANEL
Cholesterol: 126 mg/dL (ref 0–200)
HDL: <10 mg/dL — ABNORMAL LOW (ref >40)
Triglycerides: 342 mg/dL — ABNORMAL HIGH (ref <150)
VLDL: 68 mg/dL — ABNORMAL HIGH (ref 0–40)

## 2020-09-29 LAB — CBC
HCT: 29.8 % — ABNORMAL LOW (ref 39.0–52.0)
Hemoglobin: 9.9 g/dL — ABNORMAL LOW (ref 13.0–17.0)
MCH: 27.1 pg (ref 26.0–34.0)
MCHC: 33.2 g/dL (ref 30.0–36.0)
MCV: 81.6 fL (ref 80.0–100.0)
Platelets: 124 10*3/uL — ABNORMAL LOW (ref 150–400)
RBC: 3.65 MIL/uL — ABNORMAL LOW (ref 4.22–5.81)
RDW: 15.6 % — ABNORMAL HIGH (ref 11.5–15.5)
WBC: 41.8 10*3/uL — ABNORMAL HIGH (ref 4.0–10.5)
nRBC: 0 % (ref 0.0–0.2)

## 2020-09-29 LAB — HEPATIC FUNCTION PANEL
ALT: 13 U/L (ref 0–44)
AST: 54 U/L — ABNORMAL HIGH (ref 15–41)
Albumin: 2.3 g/dL — ABNORMAL LOW (ref 3.5–5.0)
Alkaline Phosphatase: 93 U/L (ref 38–126)
Bilirubin, Direct: 0.5 mg/dL — ABNORMAL HIGH (ref 0.0–0.2)
Indirect Bilirubin: 1.3 mg/dL — ABNORMAL HIGH (ref 0.3–0.9)
Total Bilirubin: 1.8 mg/dL — ABNORMAL HIGH (ref 0.3–1.2)
Total Protein: 8 g/dL (ref 6.5–8.1)

## 2020-09-29 LAB — HEPATITIS B SURFACE ANTIGEN: Hepatitis B Surface Ag: NONREACTIVE

## 2020-09-29 LAB — RENAL FUNCTION PANEL
Albumin: 2.3 g/dL — ABNORMAL LOW (ref 3.5–5.0)
Anion gap: 25 — ABNORMAL HIGH (ref 5–15)
BUN: 123 mg/dL — ABNORMAL HIGH (ref 6–20)
CO2: 17 mmol/L — ABNORMAL LOW (ref 22–32)
Calcium: 8.4 mg/dL — ABNORMAL LOW (ref 8.9–10.3)
Chloride: 96 mmol/L — ABNORMAL LOW (ref 98–111)
Creatinine, Ser: 13.25 mg/dL — ABNORMAL HIGH (ref 0.61–1.24)
GFR, Estimated: 4 mL/min — ABNORMAL LOW (ref >60)
Glucose, Bld: 94 mg/dL (ref 70–99)
Phosphorus: 7.8 mg/dL — ABNORMAL HIGH (ref 2.5–4.6)
Potassium: 5.4 mmol/L — ABNORMAL HIGH (ref 3.5–5.1)
Sodium: 138 mmol/L (ref 135–145)

## 2020-09-29 IMAGING — MR MR LUMBAR SPINE W/O CM
4 of 5 series · 28 of 48 positions shown · non-contrast
Comparison: None.

CLINICAL DATA: Bacteremia with generalized weakness

EXAM:
MRI THORACIC AND LUMBAR SPINE WITHOUT CONTRAST
TECHNIQUE: Multiplanar and multiecho pulse sequences of the thoracic and lumbar
spine were obtained without intravenous contrast.

[Series 1: T2 · sagittal · 4.0mm · 0.73mm/px · 7 of 18 slices shown (1 of 2)]
[im 1/18]
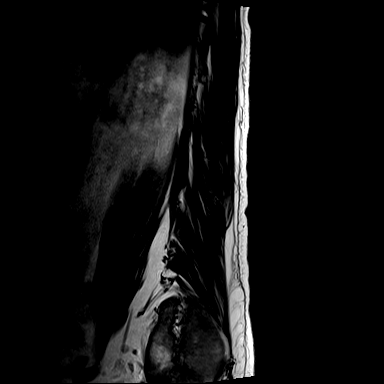
[im 3/18]
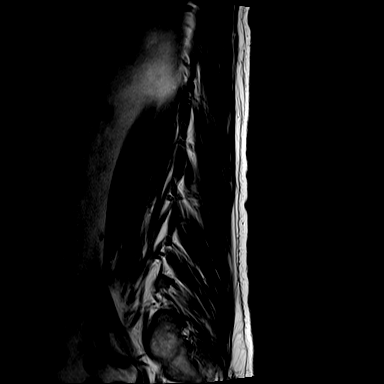
[im 6/18]
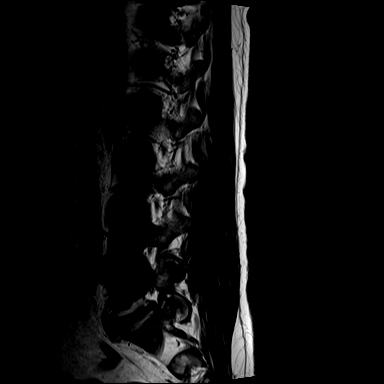
[im 9/18]
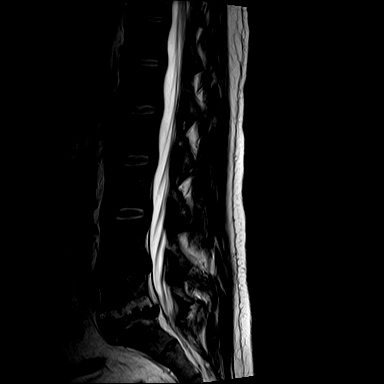
[im 12/18]
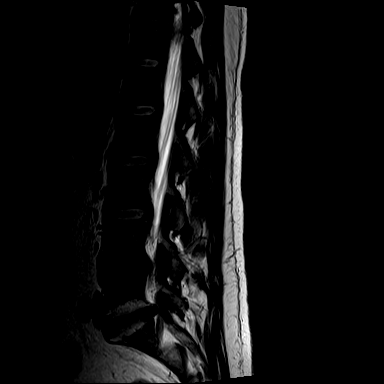
[im 15/18]
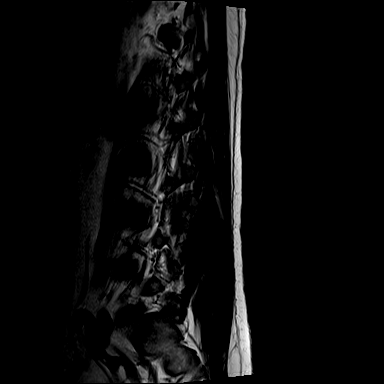
[im 18/18]
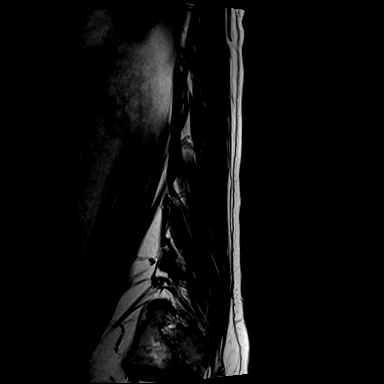

[Series 3: T1 · sagittal · 4.0mm · 0.88mm/px · 8 of 18 slices shown (1 of 2)]
[im 1/18]
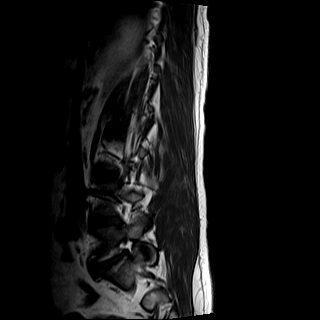
[im 3/18]
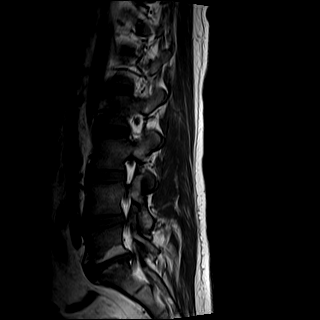
[im 5/18]
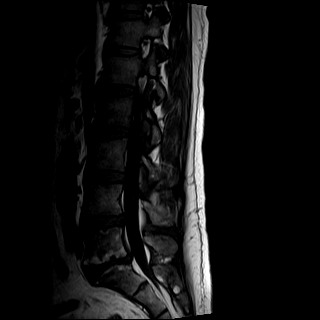
[im 8/18]
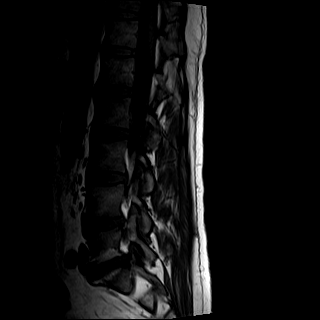
[im 10/18]
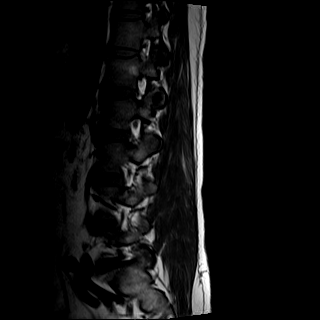
[im 13/18]
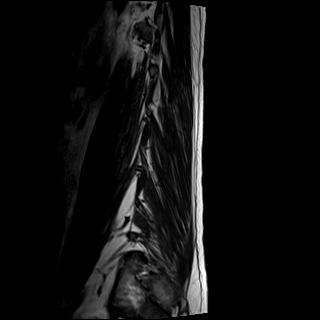
[im 15/18]
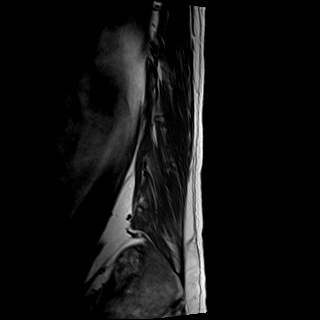
[im 18/18]
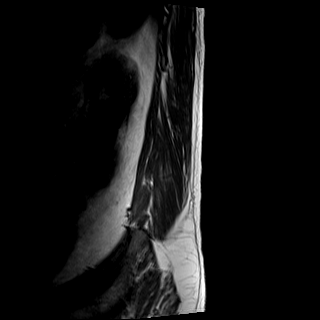

[Series 4: T2 · axial · 5.0mm · 0.57mm/px · z∈[-403,-176]mm · 9 of 30 slices shown (2 of 2)]
[im 1/30]
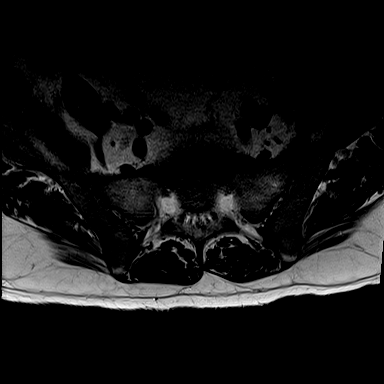
[im 5/30]
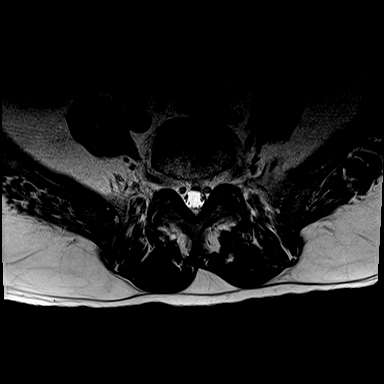
[im 10/30]
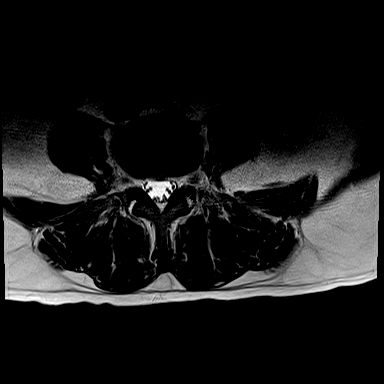
[im 13/30]
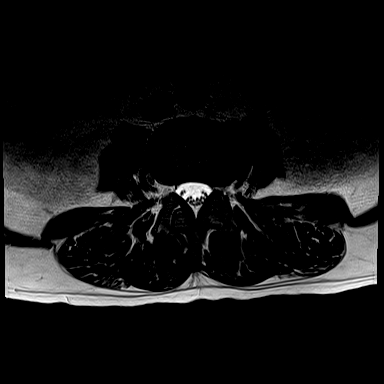
[im 15/30]
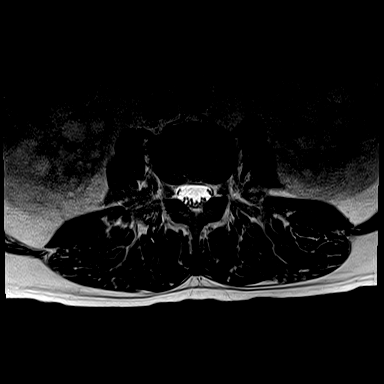
[im 17/30]
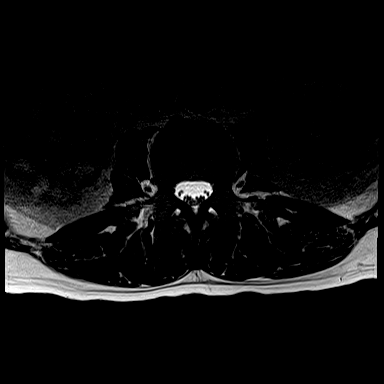
[im 20/30]
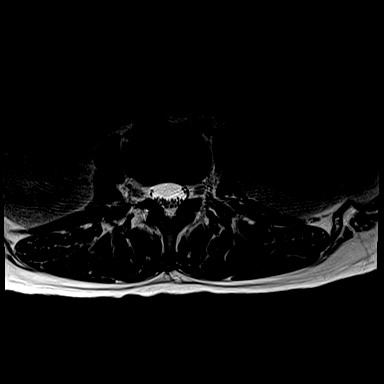
[im 25/30]
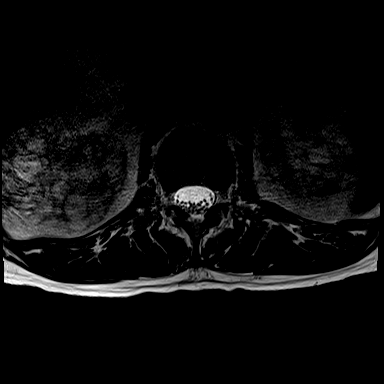
[im 30/30]
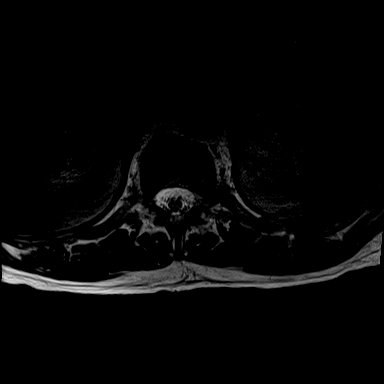

[Series 5: T1 · axial · 5.0mm · 0.34mm/px · z∈[-403,-213]mm · 4 of 30 slices shown (2 of 2)]
[im 1/30]
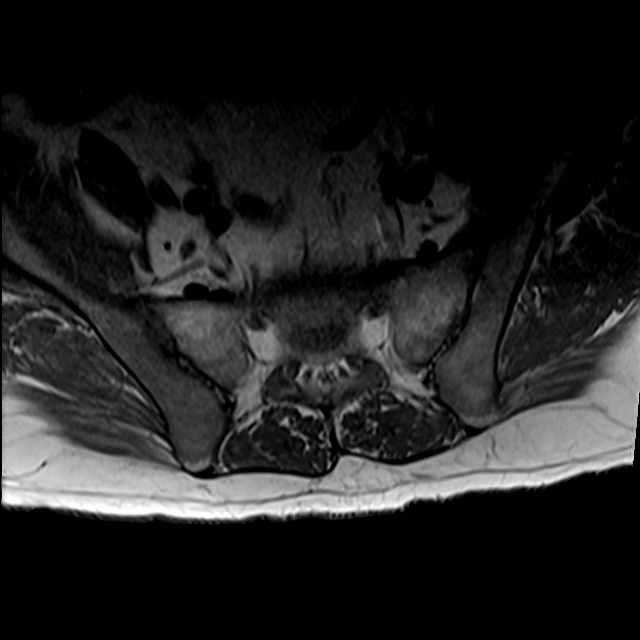
[im 5/30]
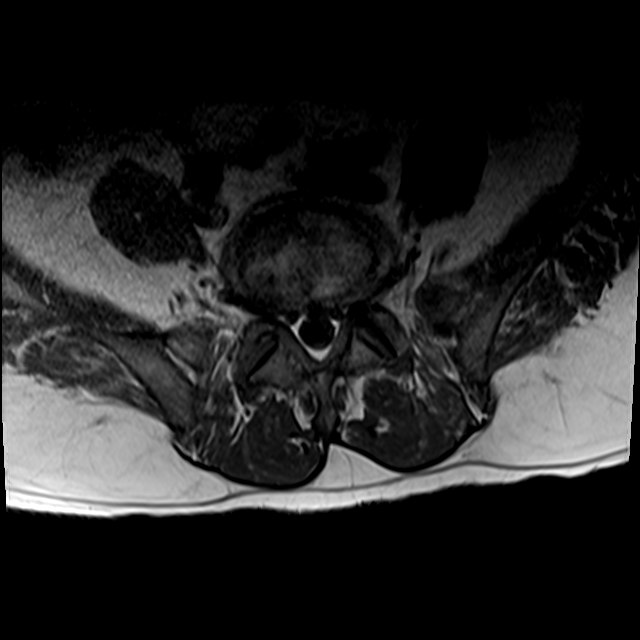
[im 15/30]
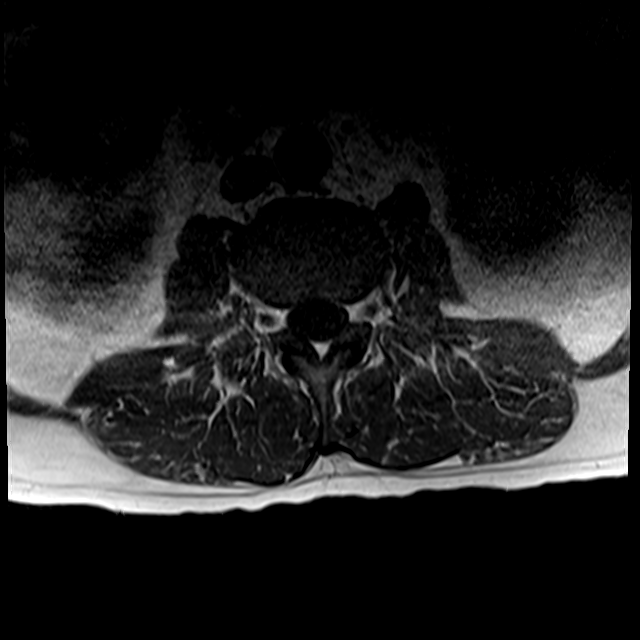
[im 25/30]
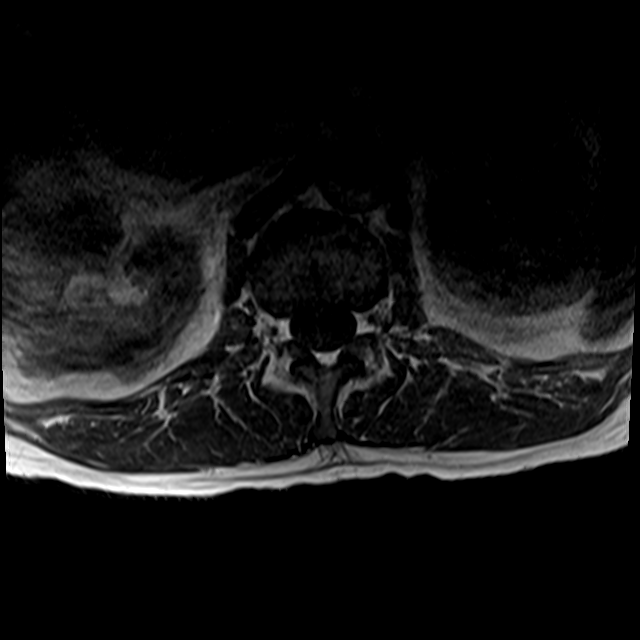

[28 of 48 positions shown; findings below may reference images not displayed]

FINDINGS: MRI THORACIC SPINE FINDINGS

Alignment:  Physiologic.

Vertebrae: There is bone marrow edema and disc space edema at C6-7.
Bone marrow signal is normal throughout the thoracic spine.

Cord:  Normal signal and morphology.

Paraspinal and other soft tissues: Negative.

Disc levels:

No thoracic spinal canal stenosis.

MRI LUMBAR SPINE FINDINGS

Segmentation:  Standard

Alignment:  There is normal

Vertebrae: Modic type 2 endplate signal changes at L5-S1. No
discitis-osteomyelitis or epidural collection.

Conus medullaris and cauda equina: Conus extends to the L1 level.
Conus and cauda equina appear normal.

Paraspinal and other soft tissues: Polycystic kidneys.

Disc levels:

Axial images severely degraded by motion. There is no spinal canal
stenosis.

At L5-S1 there is a broad disc bulge but no neural foraminal
stenosis.
IMPRESSION: 1. No thoracolumbar discitis-osteomyelitis or epidural collection.
2. Mild bone marrow edema and disc space edema at C6-C7, likely
degenerative.
3. Mild L5-S1 degenerative disc disease without spinal canal or
neural foraminal stenosis.

## 2020-09-29 IMAGING — MR MR THORACIC SPINE W/O CM
5 of 8 series · 25 of 48 positions shown · non-contrast
Comparison: None.

CLINICAL DATA: Bacteremia with generalized weakness

EXAM:
MRI THORACIC AND LUMBAR SPINE WITHOUT CONTRAST
TECHNIQUE: Multiplanar and multiecho pulse sequences of the thoracic and lumbar
spine were obtained without intravenous contrast.

[Series 29: T2 · sagittal · 3.0mm · 0.76mm/px · 5 of 19 slices shown (1 of 2)]
[im 1/19]
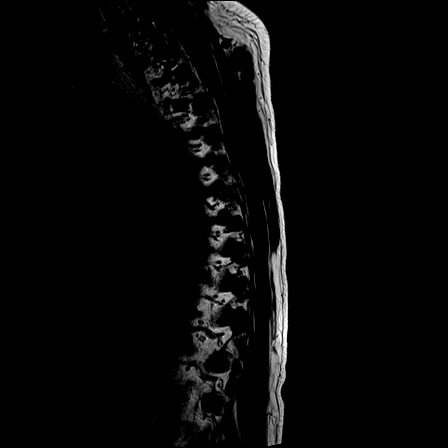
[im 5/19]
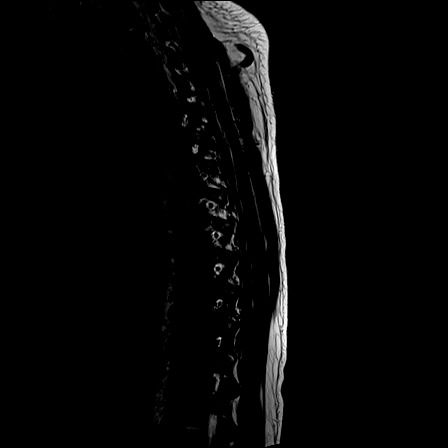
[im 10/19]
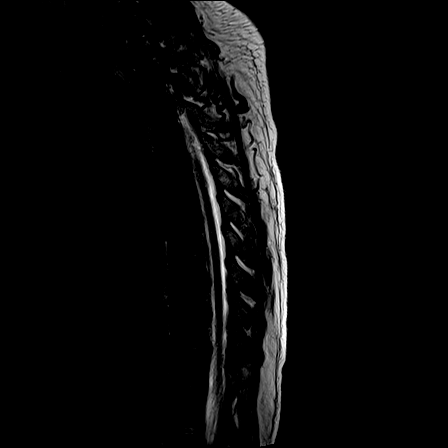
[im 14/19]
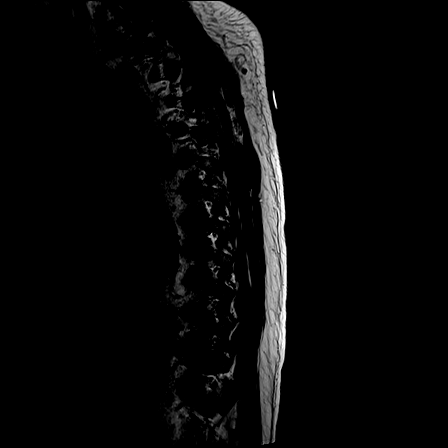
[im 19/19]
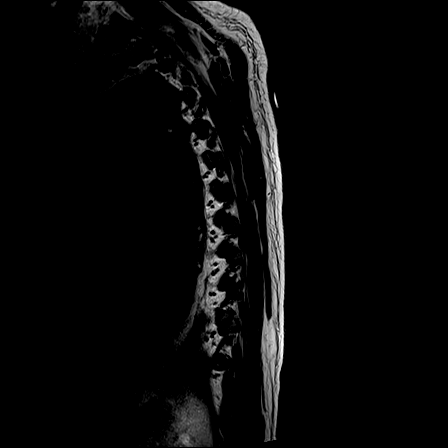

[Series 31: T1 · sagittal · 3.0mm · 0.76mm/px · 6 of 19 slices shown (1 of 3)]
[im 1/19]
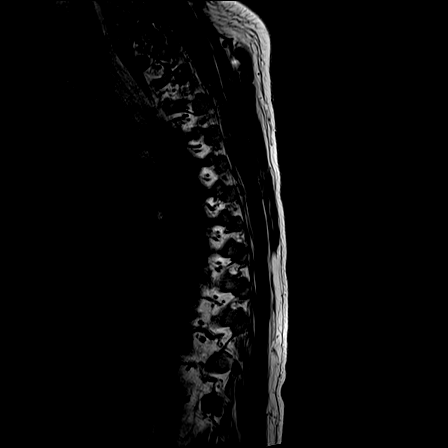
[im 4/19]
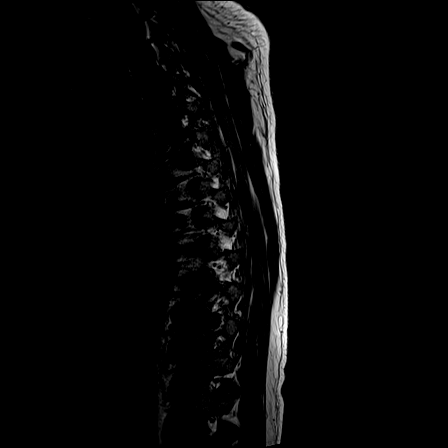
[im 8/19]
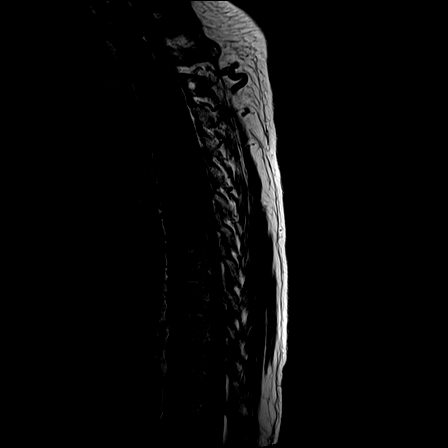
[im 11/19]
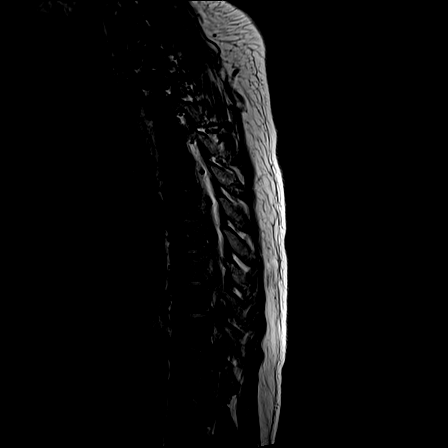
[im 15/19]
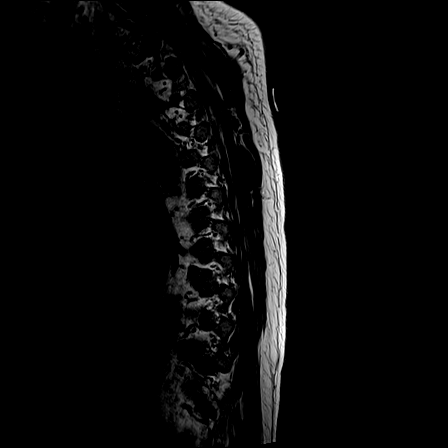
[im 19/19]
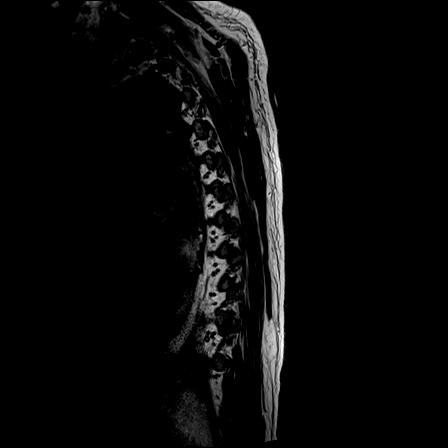

[Series 34: T1 · sagittal · 3.0mm · 0.62mm/px · 3 of 9 slices shown (2 of 3)]
[im 1/9]
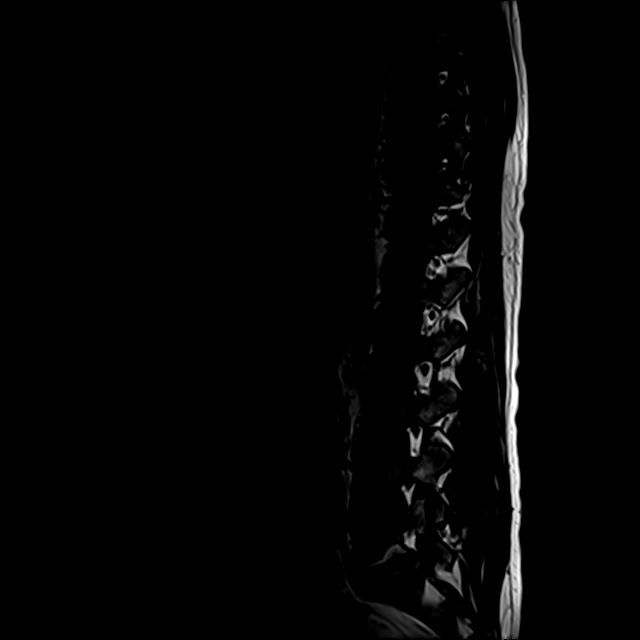
[im 5/9]
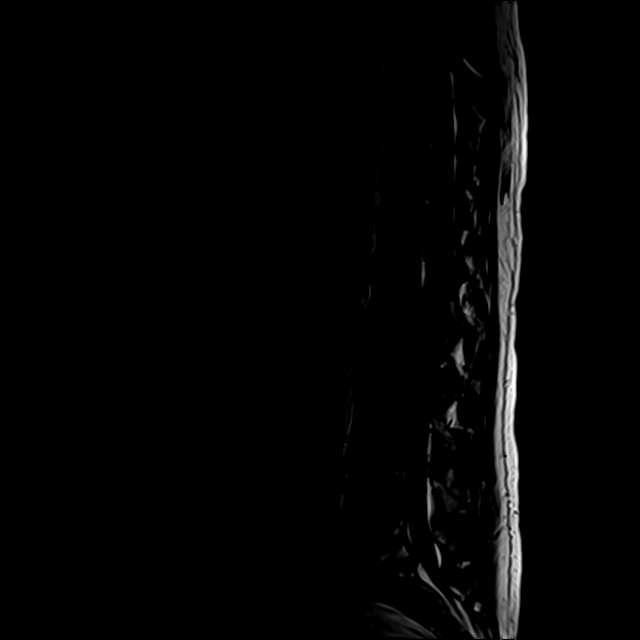
[im 9/9]
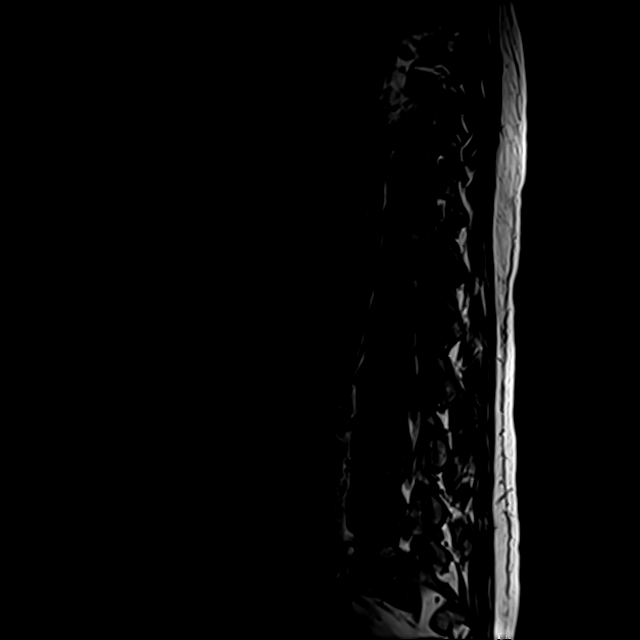

[Series 35: T1 · sagittal · 3.0mm · 0.62mm/px · 2 of 9 slices shown (3 of 3)]
[im 1/9]
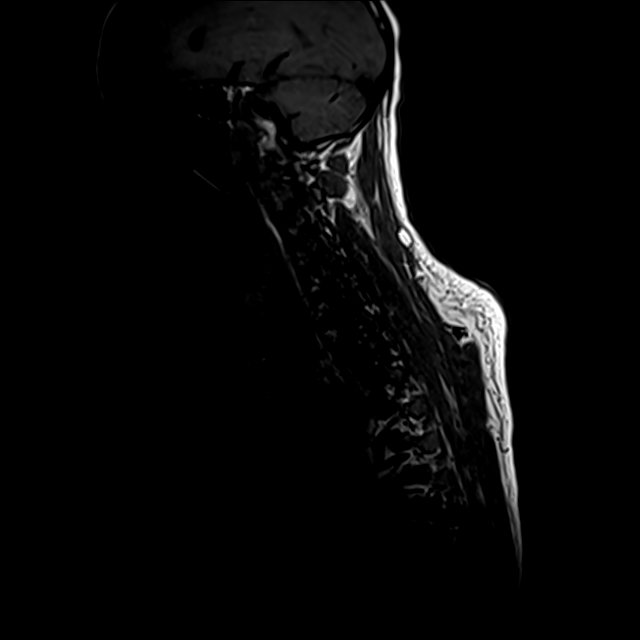
[im 5/9]
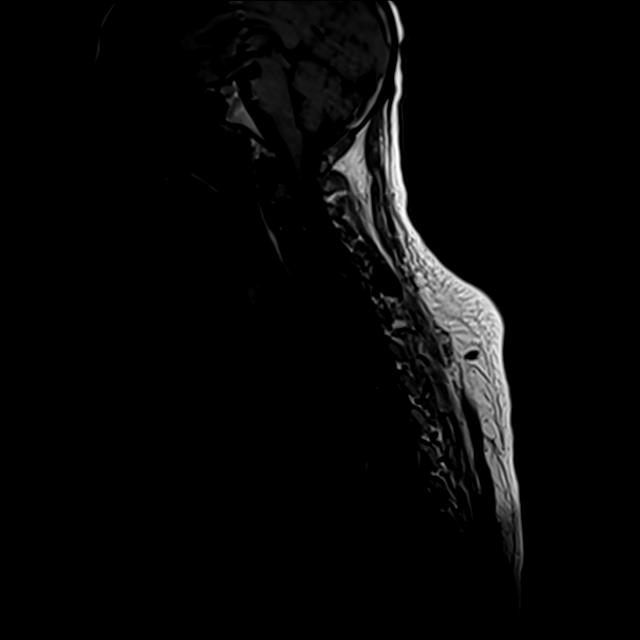

[Series 37: T2 · axial · 5.0mm · 0.59mm/px · z∈[-193,+82]mm · 9 of 39 slices shown (2 of 2)]
[im 1/39]
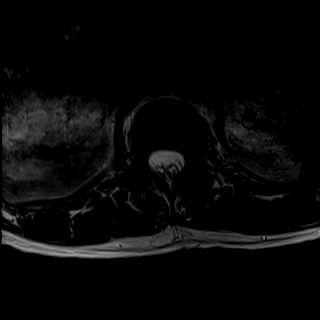
[im 7/39]
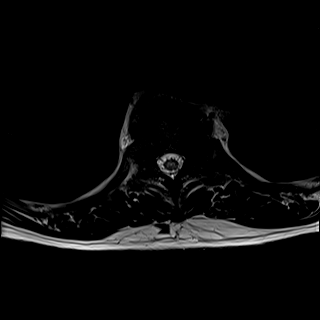
[im 11/39]
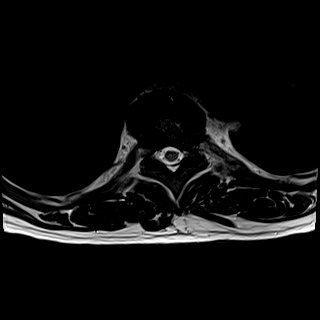
[im 18/39]
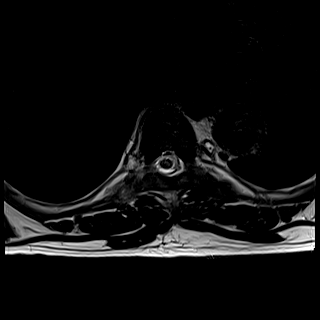
[im 21/39]
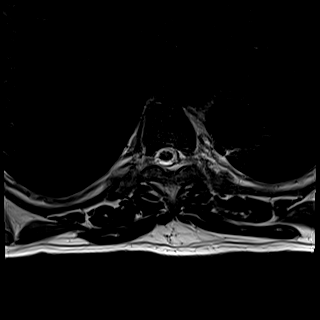
[im 28/39]
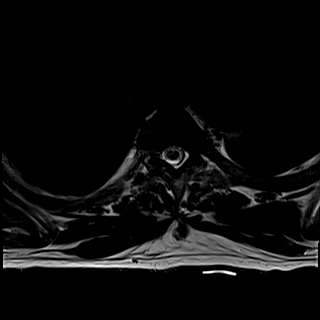
[im 32/39]
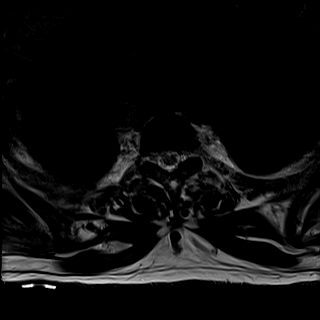
[im 35/39]
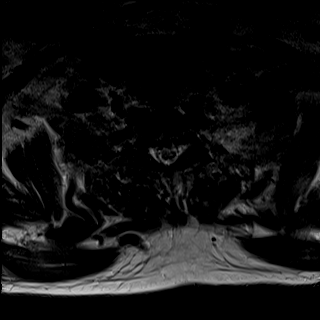
[im 39/39]
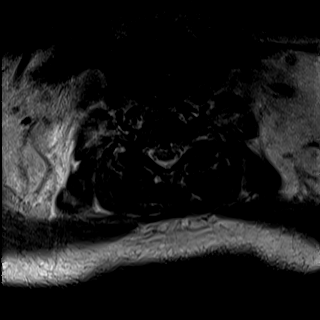

[25 of 48 positions shown; findings below may reference images not displayed]

FINDINGS: MRI THORACIC SPINE FINDINGS

Alignment:  Physiologic.

Vertebrae: There is bone marrow edema and disc space edema at C6-7.
Bone marrow signal is normal throughout the thoracic spine.

Cord:  Normal signal and morphology.

Paraspinal and other soft tissues: Negative.

Disc levels:

No thoracic spinal canal stenosis.

MRI LUMBAR SPINE FINDINGS

Segmentation:  Standard

Alignment:  There is normal

Vertebrae: Modic type 2 endplate signal changes at L5-S1. No
discitis-osteomyelitis or epidural collection.

Conus medullaris and cauda equina: Conus extends to the L1 level.
Conus and cauda equina appear normal.

Paraspinal and other soft tissues: Polycystic kidneys.

Disc levels:

Axial images severely degraded by motion. There is no spinal canal
stenosis.

At L5-S1 there is a broad disc bulge but no neural foraminal
stenosis.
IMPRESSION: 1. No thoracolumbar discitis-osteomyelitis or epidural collection.
2. Mild bone marrow edema and disc space edema at C6-C7, likely
degenerative.
3. Mild L5-S1 degenerative disc disease without spinal canal or
neural foraminal stenosis.

## 2020-09-29 IMAGING — CT CT CHEST W/ CM
2 of 5 series · 14 of 46 positions shown, 16 images · IV contrast (omnipaque)
Comparison: CT [DATE], shoulder x-ray [DATE]

CLINICAL DATA: Sepsis abdominal pain

EXAM:
CT CHEST, ABDOMEN, AND PELVIS WITH CONTRAST
TECHNIQUE: Multidetector CT imaging of the chest, abdomen and pelvis was
performed following the standard protocol during bolus
administration of intravenous contrast.
CONTRAST:  100mL OMNIPAQUE IOHEXOL 300 MG/ML  SOLN

[Series 3: cap with · axial · 0.89mm/px · z∈[-316,+284]mm · 11 of 145 slices shown, 13 images]
[im 13/145  soft-tissue]
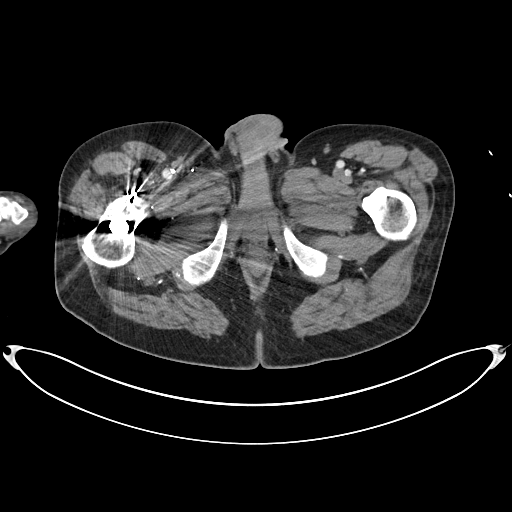
[im 13/145  bone]
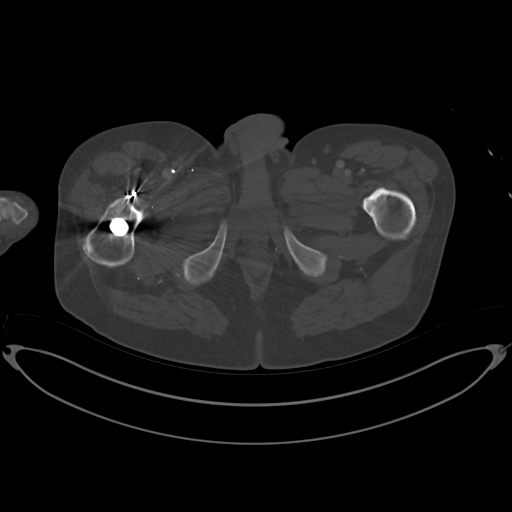
[im 25/145  soft-tissue]
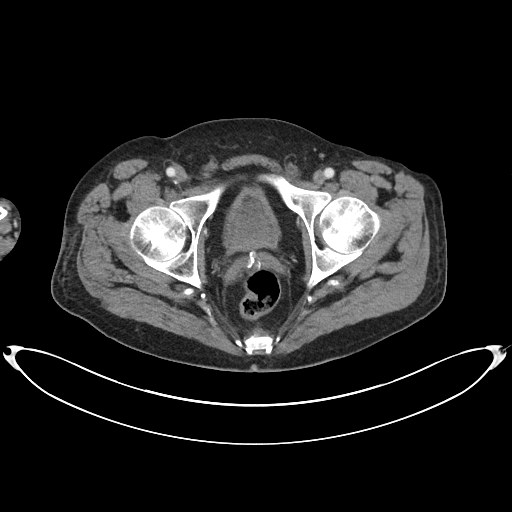
[im 37/145  soft-tissue]
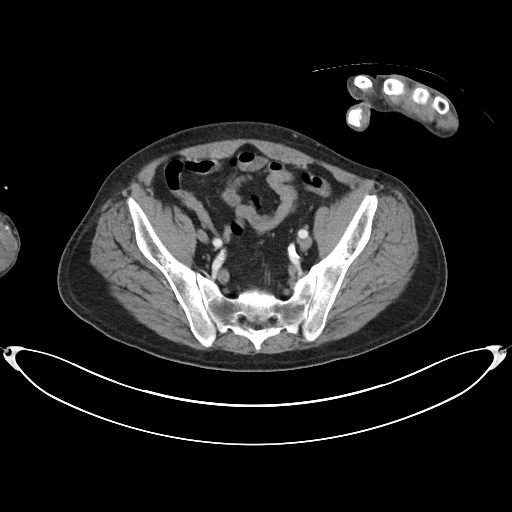
[im 49/145  soft-tissue]
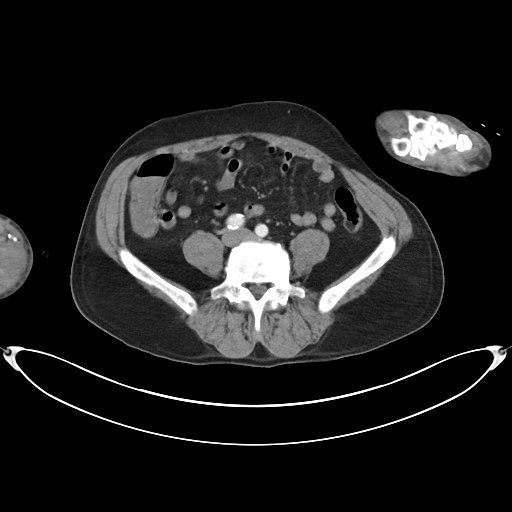
[im 61/145  soft-tissue]
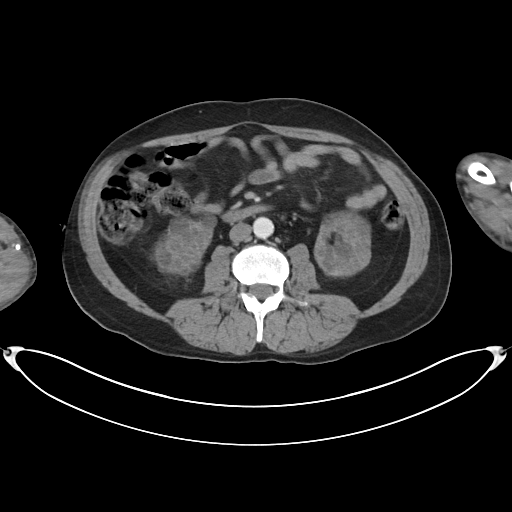
[im 73/145  soft-tissue]
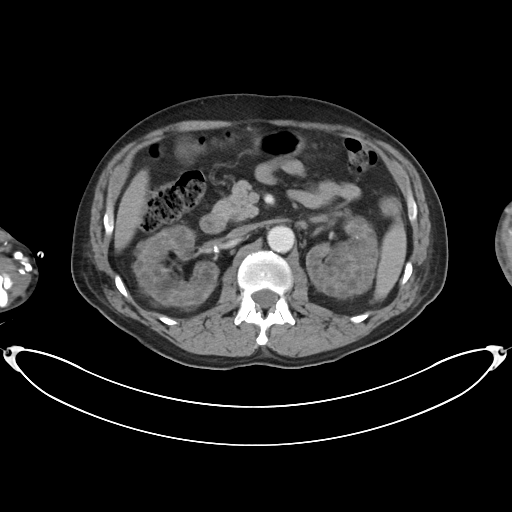
[im 85/145  soft-tissue]
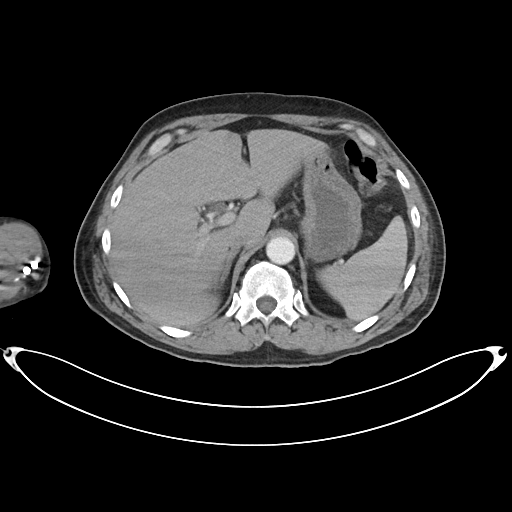
[im 97/145  soft-tissue]
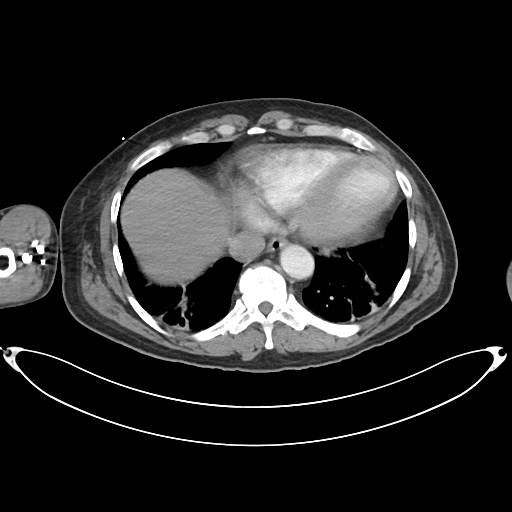
[im 109/145  soft-tissue]
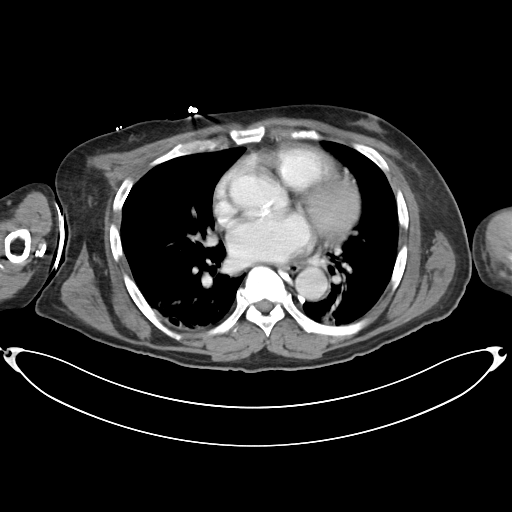
[im 109/145  bone]
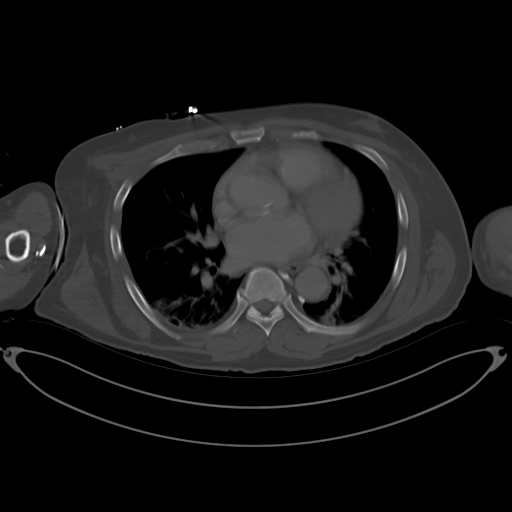
[im 121/145  soft-tissue]
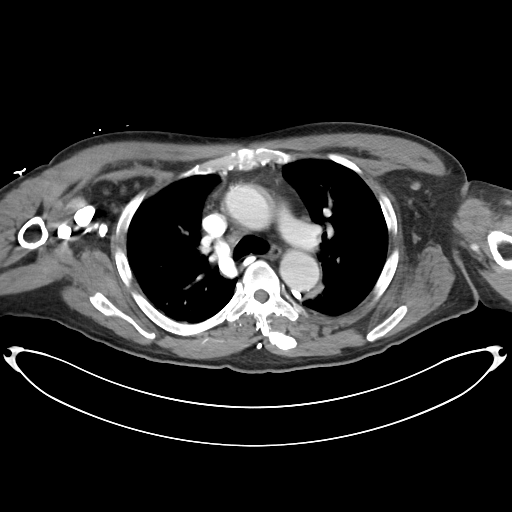
[im 133/145  soft-tissue]
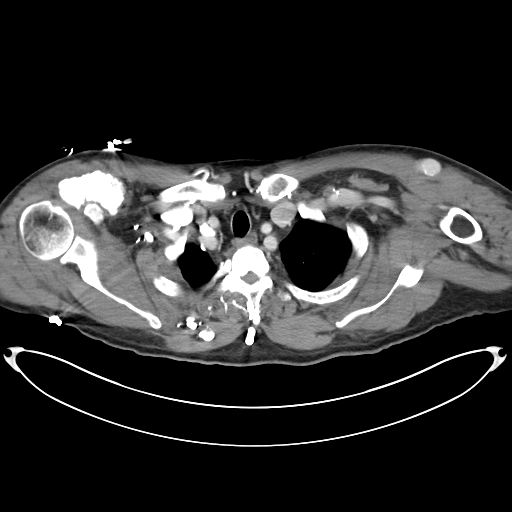

[Series 6: cor · coronal · 0.99mm/px · 3 of 86 slices shown]
[im 29/86  soft-tissue]
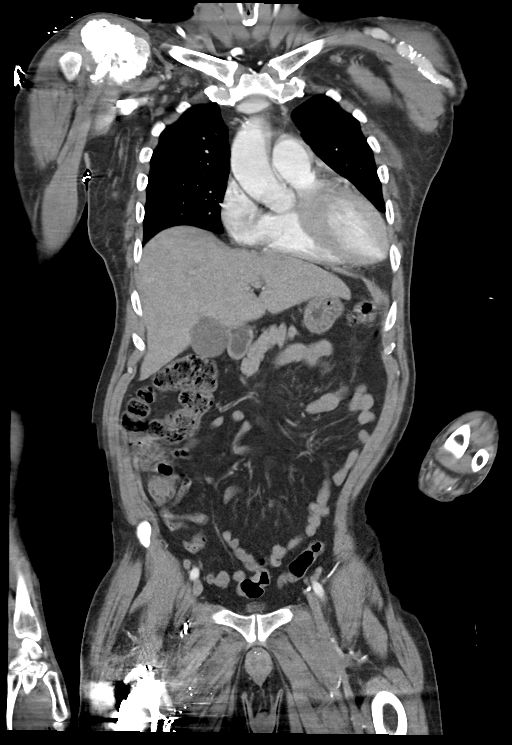
[im 38/86  soft-tissue]
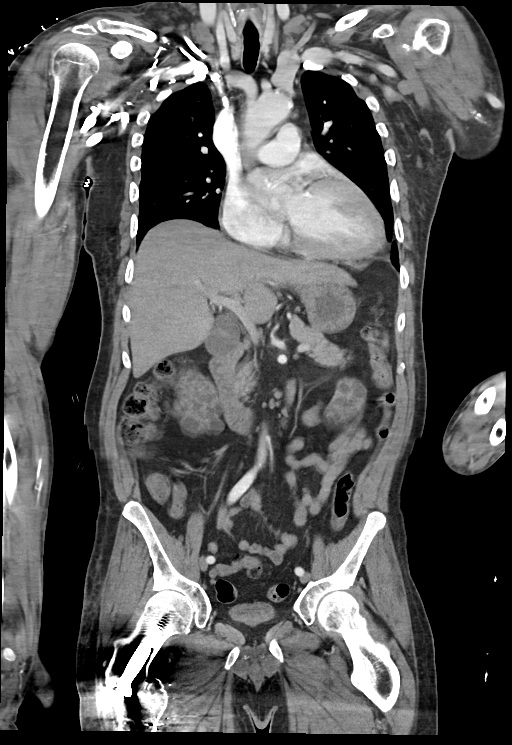
[im 48/86  soft-tissue]
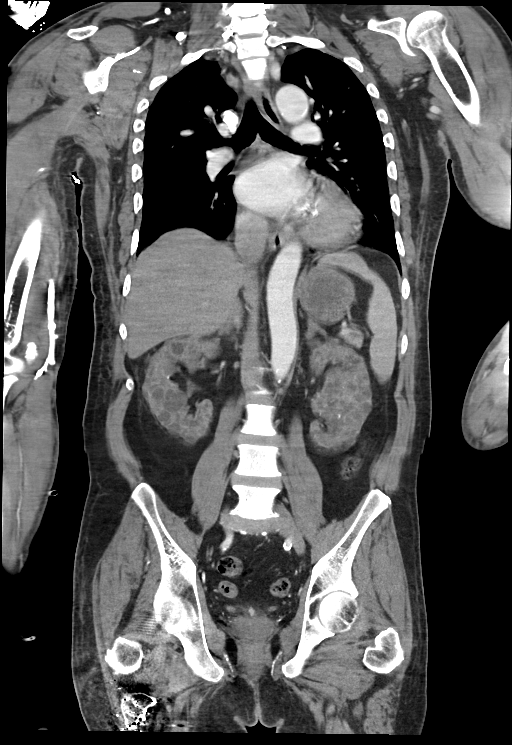

[14 of 46 positions shown; findings below may reference images not displayed]

FINDINGS: CT CHEST FINDINGS

Cardiovascular: Nonaneurysmal aorta. Mild aortic atherosclerosis. No
aneurysm or dissection. Cardiomegaly without significant pericardial
effusion. Partially visualized stent within the left anterior chest,
communicates with incompletely visualized presumed fistula.

Mediastinum/Nodes: Midline trachea. No thyroid mass. No suspicious
adenopathy. Esophagus within normal limits.

Lungs/Pleura: No pleural effusion or pneumothorax. Cavitary mass in
the left upper lobe apex measuring 1.9 x 2.2 cm with surrounding
ground-glass density, series 5, image number 43. 4 mm ground-glass
nodule within the posterior left upper lobe, series 5, image number
78. small focus of consolidation and ground-glass density in the
posterior right upper lobe, series 5, image number 67. Streaky
atelectasis in the lower lobes.

Musculoskeletal: Sternum is intact. Vertebral body heights are
normal. Large calcified mass anterior to right shoulder. Similar
smaller calcified mass within the left posterior paraspinal soft
tissues likely due to calcinosis.

CT ABDOMEN PELVIS FINDINGS

Hepatobiliary: No focal liver abnormality is seen. No gallstones,
gallbladder wall thickening, or biliary dilatation.

Pancreas: Unremarkable. No pancreatic ductal dilatation or
surrounding inflammatory changes.

Spleen: Normal in size without focal abnormality.

Adrenals/Urinary Tract: Adrenal glands are normal. Polycystic kidney
disease. Scattered parenchymal calcifications and cysts with mild
calcifications. Chunky calcification posterior lower pole left
kidney measuring 2 cm. No hydronephrosis. Empty urinary bladder.

Stomach/Bowel: Stomach is within normal limits. Appendix appears
normal. No evidence of bowel wall thickening, distention, or
inflammatory changes.

Vascular/Lymphatic: Mild to moderate aortic atherosclerosis without
aneurysm. No suspicious nodes.

Reproductive: Prostate unremarkable.  Seminal vesicle calcification

Other: Negative for free air or free fluid.

Musculoskeletal: No acute osseous abnormality. Postsurgical changes
of the right femur. Multiple metallic foreign bodies in the right
thigh.
IMPRESSION: 1. Cavitary mass in the left upper lobe apex measuring up to 2.2 cm
with surrounding ground-glass density. Findings could be secondary
to cavitary infection or potential septic embolus. A cavitary
neoplasm is also in the differential. There are additional small
foci of consolidation and ground-glass density in the right upper
lobe and small ground-glass nodule in left upper lobe, potentially
infectious or inflammatory. Short interval chest CT follow-up is
recommended.
2. Polycystic kidney disease.
3. No CT evidence for acute intra-abdominal or pelvic abnormality.
4. Bulky calcified mass anterior to right shoulder with smaller left
paraspinal soft tissue calcification probably due to calcinosis
related to chronic kidney disease.

Aortic Atherosclerosis ([HK]-[HK]).

## 2020-09-29 MED ORDER — IOHEXOL 300 MG/ML  SOLN
100.0000 mL | Freq: Once | INTRAMUSCULAR | Status: AC | PRN
  Administered 2020-09-29: 100 mL via INTRAVENOUS

## 2020-09-29 MED ORDER — HEPARIN SODIUM (PORCINE) 1000 UNIT/ML DIALYSIS: 1000.0000 [IU] | INTRAMUSCULAR | Status: DC | PRN

## 2020-09-29 MED ORDER — HYDROMORPHONE HCL 1 MG/ML IJ SOLN
INTRAMUSCULAR | Status: AC
  Administered 2020-09-29: 0.5 mg via INTRAVENOUS
  Filled 2020-09-29: qty 0.5

## 2020-09-29 MED ORDER — SODIUM CHLORIDE 0.9 % IV SOLN: 100.0000 mL | INTRAVENOUS | Status: DC | PRN

## 2020-09-29 MED ORDER — METOPROLOL TARTRATE 5 MG/5ML IV SOLN
2.5000 mg | Freq: Four times a day (QID) | INTRAVENOUS | Status: DC
  Administered 2020-09-29 – 2020-09-30 (×4): 2.5 mg via INTRAVENOUS
  Filled 2020-09-29 (×3): qty 5

## 2020-09-29 MED ORDER — LORAZEPAM 2 MG/ML IJ SOLN
1.0000 mg | Freq: Once | INTRAMUSCULAR | Status: AC
  Administered 2020-09-29: 1 mg via INTRAVENOUS
  Filled 2020-09-29: qty 1

## 2020-09-29 MED ORDER — PENTAFLUOROPROP-TETRAFLUOROETH EX AERO: 1.0000 "application " | INHALATION_SPRAY | CUTANEOUS | Status: DC | PRN

## 2020-09-29 MED ORDER — LIDOCAINE-PRILOCAINE 2.5-2.5 % EX CREA: 1.0000 "application " | TOPICAL_CREAM | CUTANEOUS | Status: DC | PRN

## 2020-09-29 MED ORDER — LIDOCAINE HCL (PF) 1 % IJ SOLN: 5.0000 mL | INTRAMUSCULAR | Status: DC | PRN

## 2020-09-29 MED ORDER — ALTEPLASE 2 MG IJ SOLR: 2.0000 mg | Freq: Once | INTRAMUSCULAR | Status: DC | PRN

## 2020-09-29 MED ORDER — HYDROMORPHONE HCL 1 MG/ML IJ SOLN
0.5000 mg | INTRAMUSCULAR | Status: DC | PRN
  Administered 2020-09-29 – 2020-10-14 (×12): 0.5 mg via INTRAVENOUS
  Filled 2020-09-29: qty 1
  Filled 2020-09-29: qty 0.5
  Filled 2020-09-29: qty 1
  Filled 2020-09-29 (×2): qty 0.5
  Filled 2020-09-29 (×3): qty 1
  Filled 2020-09-29: qty 0.5
  Filled 2020-09-29: qty 1
  Filled 2020-09-29 (×2): qty 0.5

## 2020-09-29 NOTE — Progress Notes (Addendum)
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 Chest x-ray showed possible opacity at left lower lobe is concerning for pneumonia. MRSA bacteremia.  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   Vitals:   09/29/20 0340 09/29/20 0551 09/29/20 0552 09/29/20 0700  BP: (!) 162/114 (!) 158/111  (!) 147/109  Pulse: (!) 112 (!) 108  (!) 107  Resp: 19 (!) 22  (!) 22  Temp: 98.4 F (36.9 C) 98.7 F (37.1 C) 98.7 F (37.1 C) 98.5 F (36.9 C)  TempSrc: Oral Axillary Axillary Oral  SpO2: 100% 99%  100%  Weight:      Height:       CBC:  Recent Labs  Lab 09/27/20 1125 09/28/20 0436  WBC 23.2* 29.6*  HGB 11.0* 9.3*  HCT 32.6* 28.1*  MCV 82.3 81.7  PLT 83* 88*   Basic Metabolic Panel:  Recent Labs  Lab 09/27/20 1416 09/28/20 0436  NA 137 135  K 4.6 5.0  CL 92* 96*  CO2 23 22  GLUCOSE 93 119*  BUN 48* 76*  CREATININE 10.30* 11.10*  CALCIUM 8.6* 8.0*   Lipid Panel:  Recent Labs  Lab 09/29/20 0435  CHOL 126  TRIG 342*  HDL <10*  CHOLHDL NOT CALCULATED  VLDL 68*  LDLCALC NOT CALCULATED   HgbA1c:  Recent Labs  Lab 09/29/20 0435  HGBA1C 5.5   Urine Drug Screen: No results for input(s): LABOPIA, COCAINSCRNUR, LABBENZ, AMPHETMU, THCU, LABBARB in the last 168 hours.  Alcohol Level No results for input(s): ETH in the last 168 hours.  MRA Neck Result date 09/28/2020 The visualized common carotid, internal carotid and vertebral arteries patent within the neck without appreciable stenosis.  MRA head WO contrast: Result date 09/28/2020 No intracranial large vessel occlusion or proximal high-grade arterial stenosis.  MRI  Brain WO contrast Result date 09/28/2020 1. 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 Multiple vascular territories are involved and findings are highly suspicious for an embolic process. 2. 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 A few scattered supratentorial chronic microhemorrhages are also present. 3. Mild generalized atrophy of the brain.  CT Head WO contrast Result date 09/28/2020 IMPRESSION: 1. 10 mm infarct within the medial left cerebellar hemisphere, which may be acute. Consider brain MRI for further evaluation. 2. No acute intracranial hemorrhage or acute demarcated cortical infarct.  EEG adult Result Date: 09/28/2020 IMPRESSION: This study initially showed generalized periodic discharges with triphasic morphology at 2-2.'5hz'$ . This eeg pattern can be on the ictal-interictal continuum and also seen with toxic-metabolic etiology. EEG improved after IV ativan which although not confirmatory is concerning for ictal nature of this pattern. There is also moderate diffuse encephalopathy, non specific etiology. One episode of right arm tremor was noted at 1528 during which eeg showed generalized periodic discharges with triphasic morphology at 2-2.5 without definite evolution. However, focal motor seizures may not be seen on scalp eeg. Therefore, clinical correlation is recommended. [name]   ECHOCARDIOGRAM COMPLETE BUBBLE STUDY Result Date: 09/28/2020    1. Left ventricular ejection fraction, by estimation, is 55 to 60%. The left  ventricle has normal function. The left ventricle has no regional wall motion abnormalities. There is moderate concentric left ventricular hypertrophy. Left ventricular diastolic parameters are consistent with Grade II diastolic dysfunction (pseudonormalization).  2. Right ventricular systolic  function is normal. The right ventricular size is normal. There is normal pulmonary artery systolic pressure.  3. Left atrial size was moderately dilated.   4. Right atrial size was mild to moderately dilated.   5. The mitral valve is normal in structure. Mild to moderate mitral valve regurgitation. No evidence of mitral stenosis. Moderate to severe mitral annular calcification.   6. The aortic valve is tricuspid. Aortic valve regurgitation is trivial. No aortic stenosis is present.   7. The inferior vena cava is normal in size with greater than 50% respiratory variability, suggesting right atrial pressure of 3 mmHg.   8. Agitated saline contrast bubble study was negative, with no evidence of any interatrial shunt.   PHYSICAL EXAM Physical Exam  Constitutional: Appears well-developed and well-nourished.  Psych: Appears anxious on examination Eyes: No scleral injection, normal conjunctiva Head: Normocephalic and atraumatic without obvious deformity EENT: No OP obstruction. .  Cardiovascular:  tachycardic on cardiac monitor, extremities warm without edema Respiratory: Non-labored breathing  GI: Soft.  No distension.  Skin: WDI  Neuro: Mental Status: Patient with eye closed. Mumbles to examiner questions. Poor attention noted on examination. He does not follow commands. He will hold upper extremities against gravity with constant coaching. Unable to follow 2-step commands. Patient is unable to give a clear and coherent history of present illness. Comprehension and fluency are not intact. No signs of neglect noted on examination.  Cranial Nerves: II: Pupils are equal, round, and reactive to light. III,IV, VI: EOMI unable to assess  V: Unable to assess; patient mumbles to all examiner questions.  VII: Face is symmetric resting  VIII: Hearing is intact to voice X: Phonation not intact. Unable to assess palate due to patient not following commands.   XI: Head appears midline.  XII: Unable to  assess tongue protrusion due to patient not following commands.  Motor: Tone is normal. Bulk is normal. Bilateral upper extremities 4-/5 strength with antigravity movement without pronator drift with Bilateral lower extremities 3/5 strength, both drift immediately to bed on assessment. Patient with asterixis of bilateral upper extremities on assessment.  Sensory: Withdrawals equally to noxious stimulation of upper and lower extremities and states "ouch"  Deep Tendon Reflexes: 1+ and symmetric in the biceps and patellae. Plantars: Toes are downgoing bilaterally.  Cerebellar: FNF and HKS are unable to be assessed due to patient unable to follow commands.   ASSESSMENT/PLAN [name] is a 50 y.o. male with history of end-stage renal disease due to adult polycystic kidney disease on hemodialysis Monday, Wednesday, Friday with left upper extremity AV graft, hypertension, obstructive sleep apnea, chronic heart failure with preserved ejection fraction, hepatitis C with liver cirrhosis, and history of alcohol abuse who presented to the hospital 09/27/20 for evaluation of generalized weakness, lethargy, tachycardia, fever, and chills. He was found to be septic with a temperature of 103.2, a heart rate in the 140's, leukocytosis, and an elevated lactic acidosis with lactic acid of 3.5; presumably secondary to a left lower lobe pneumonia identified on x-ray.   On 09/28/2020 at [place] he developed word finding difficulty and aphasia.  He was also noted to have tremor of the upper extremities, subsequently a code stroke was activated and he was taken to CT for imaging.  No TPA was  given as he was outside of time window (LKN 0100).  He was not a thrombectomy candidate as no LVO seen on imaging.      Stroke:  bilateral anterior and posterior small/punctate infarcts likely secondary to cadioembolic source, concerning for endocarditis  Code Stroke CT head: 10 mm infarct within the medial left cerebellar  hemisphere, which may be acute.    MRI head: Scattered small acute infarcts within the bilateral cerebral hemispheres, right basal ganglia, callosal splenium and bilateral cerebellar hemispheres  MRA head and neck - negative  2D Echo EF 55-60%, Grade II diastolic dysfunction, moderate LVH  Recommend TEE for workup for infective endocardiditis  LDL pending  Triglycerides 342  HgbA1c 5.5  VTE prophylaxis - Heparin with dialysis  NPO: speech therapy is following  No antithrombotic prior to admission, now on No antithrombotic. Due to the possibility of endocarditis  Therapy recommendations:  SNF  Disposition: pending  Tremor vs. Myoclonus   EEG: moderate diffuse encephalopathy, non specific etiology.  Multiple episodes of right arm tremor were noted without concomitant EEG change and are most likely not epileptic.   Likely due to encephalopathy    Ammonia 27  Continue to treat underlying disease  MRSA bactermia in the setting of embolic stroke Fever  leukocytosis  Tmax 100.6  WBC 23.2->29.6->41.8   Highly suspicious for endocarditis  Currently on vancomycin  Will need TEE   Cardiology following   ESRD on HD  Nephrology on board  Had HD today  Hypertension  Home meds:  Amlodipine '10mg'$  daily, clonodine 0.'3mg'$  tid,  Unstable . BP goal normotensive  Hyperlipidemia  Home meds:  none  LDL pending, goal < 70  TG 342  Tobacco Abuse  Current smoker  Will need smoking cessation counseling once appropriate  Nicotine patch  Other Stroke Risk Factors  ETOH use  Coronary artery disease  Obstructive sleep apnea, unclear if he is on CPAP at home  Congestive heart failure diastolic CHF (0000000)  Other Active problems  Hepatitis C/ Cirrhosis of liver  Hospital day # 2  I spent  35 minutes in total face-to-face time with the patient, more than 50% of which was spent in counseling and coordination of care, reviewing test results, images and  medication, and discussing the diagnosis, treatment plan and potential prognosis. This patient's care requiresreview of multiple databases, neurological assessment, other specialists and medical decision making of high complexity.  Rosalin Hawking, MD PhD Stroke Neurology 09/29/2020 7:32 PM      To contact Stroke Continuity provider, please refer to [email or web address] After hours, contact General Neurology

## 2020-09-29 NOTE — Progress Notes (Addendum)
PROGRESS NOTE    Patrick Anthony  [id number] DOB: [birth year] DOA: 09/27/2020 PCP: [name], MD  Chief Complaint  Patient presents with  . Generalized Body Aches    Brief Narrative:  50 year old male with medical history of end-stage renal disease due to APKD on hemodialysis MWF through left upper extremity AV graft, hypertension, OSA, chronic diastolic CHF, EF 55 to [id number], liver cirrhosis due to hepatitis C, history of alcohol abuse came to hospital with generalized weakness. Chest x-ray showed possible opacity at left lower lobe is concerning for pneumonia. CT abdomen/pelvis showed findings of adult polycystic kidney disease. Blood cultures now showing staph aureus.  ID is following.  Hospitalization complicated by embolic stroke with presumed endocarditis in setting of his bacteremia.  Assessment & Plan:   Principal Problem:   Pneumonia due to infectious agent Active Problems:   Chronic diastolic CHF (congestive heart failure) (HCC)   HTN (hypertension)   Anemia in chronic kidney disease   ESRD on dialysis ([place])   Sepsis ([place])   ADPKD (autosomal dominant polycystic kidney disease)   Sepsis due to pneumonia (HCC)   Lactic acidosis   Cerebral embolism with cerebral infarction  1. New onset aphasia  Scattered Small Acute Infarcts within Bilateral Cerebral Hemispheres, Right Basal Ganglia, Callosal Splenium, and Bilateral Cerebellar Hemispheres  Concerning for Embolic Process: 1. Concerning for embolic stroke from endocarditis in setting of below 2. TEE pending 3. Echo with EF 55-60%, grade II diastolic dysfunction (no intracardiac source of embolism detected on transthoracic study) 4. MRA head/neck without appreciable stenosis in common carotid, internal carotid, vertebral arteries.  No intracranial LVO or proximal high grade arterial stenosis.  5. HDL <10, LDL not calculated - A1c 5.5 6. Holding anitplatelets/anticoagulants at this time until further w/u to rule out  endocarditis and mycotic aneurysms 7. Neurology consult, appreciate recommendations  2. MRSA Bacteremia  Presumed Endocarditis  Possible Pneumonia 1. Presumed endocarditis in setting of his embolic stroke above 2. TEE pending 3. 2/21 cx with MRSA bacteremia - follow final cx results 4. Repeat blood cultures 2/24 5. CT abdomen pelvis without contrast on presentation with findings c/w PCKD, equivocal thickening of urinary bladder, no abscess in abdomen/pelvis (see report) 6. CXR 2/21 with streaky L basilar opacities 7. 2/11 right shoulder imaging with 9 cm calcified soft tissue mass (calcinosis of chronic renal failure) 8. Pain during dialysis - unclear location given stroke - grimaces during abdominal exam -> will repeat chest/abdomen/pelvis with contrast.  Also obtain R shoulder CT.  9. Continue vancomycin per ID 10. Worsening leukocytosis - will continue to follow on abx, w/u for source as above  3. Seizure Like Activity of Upper Extremities 1. LTM EEG per neurology - mod diffuse encephalopathy  2. 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 ESRD due to adult polycystic kidney disease  Hyperkalemia  AGMA-patient gets dialysis MWF through left upper extremity fistula, continue PhosLo for hyperphosphatemia, Sensipar for secondary hyperparathyroidism.  1. Nephrology following.  5. Anemia of chronic disease-  1. Relatively stable 2. Continue to monitor.  3. Patient is on ESA as outpatient.  6. Hypertension  1. clonidine 0.3 mg 3 times daily 2. hydralazine 100 mg twice daily 3. metoprolol 75 mg twice daily 4. amlodipine 10 mg daily.  5. BP meds currently on hold with dysphagia/AMS - pending improvement in swallow 6. Schedule low dose metop to avoid rebound tachycardia  7. Sinus Tachycardia 1. 2/2 pain vs rebound tachycardia with metop on hold vs worsening infection (with worsening  leukocytosis) 2. Treat pain, schedule metop - follow w/u for infection above 3. EKG  with sinus tachycardia, appears similar to priors  8. Liver cirrhosis due to hepatitis C-patient has chronic HCV infection, HIV Patrick Anthony was nonreactive during prior hospital stay.  9. Thrombocytopenia 1. In setting of sepsis, follow  DVT prophylaxis: SCD Code Status: full  Family Communication: none at bedside - called listed contact (significant other - call did not go through)  Disposition:   Status is: Inpatient  Remains inpatient appropriate because:Inpatient level of care appropriate due to severity of illness   Dispo: The patient is from: Home              Anticipated d/c is to: pending              Anticipated d/c date is: > 3 days              Patient currently is not medically stable to d/c.   Difficult to place patient No  Consultants:   Renal  ID  Neurology  cardiology  Procedures:  TTE IMPRESSIONS    1. Left ventricular ejection fraction, by estimation, is 55 to 60%. The  left ventricle has normal function. The left ventricle has no regional  wall motion abnormalities. There is moderate concentric left ventricular  hypertrophy. Left ventricular  diastolic parameters are consistent with Grade II diastolic dysfunction  (pseudonormalization).  2. Right ventricular systolic function is normal. The right ventricular  size is normal. There is normal pulmonary artery systolic pressure.  3. Left atrial size was moderately dilated.  4. Right atrial size was mild to moderately dilated.  5. The mitral valve is normal in structure. Mild to moderate mitral valve  regurgitation. No evidence of mitral stenosis. Moderate to severe mitral  annular calcification.  6. The aortic valve is tricuspid. Aortic valve regurgitation is trivial.  No aortic stenosis is present.  7. The inferior vena cava is normal in size with greater than 50%  respiratory variability, suggesting right atrial pressure of 3 mmHg.  8. Agitated saline contrast bubble study was negative, with  no evidence  of any interatrial shunt.   Conclusion(s)/Recommendation(s): No intracardiac source of embolism  detected on this transthoracic study. Patrick Anthony transesophageal echocardiogram is  recommended to exclude cardiac source of embolism if clinically indicated.  Antimicrobials: Anti-infectives (From admission, onward)   Start     Dose/Rate Route Frequency Ordered Stop   09/29/20 1200  vancomycin (VANCOCIN) IVPB 1000 mg/200 mL premix        1,000 mg 200 mL/hr over 60 Minutes Intravenous Every M-W-F (Hemodialysis) 09/27/20 1443     09/29/20 1200  ceFEPIme (MAXIPIME) 2 g in sodium chloride 0.9 % 100 mL IVPB  Status:  Discontinued        2 g 200 mL/hr over 30 Minutes Intravenous Every M-W-F (Hemodialysis) 09/27/20 1443 09/28/20 1054   09/28/20 0900  metroNIDAZOLE (FLAGYL) IVPB 500 mg  Status:  Discontinued        500 mg 100 mL/hr over 60 Minutes Intravenous Every 12 hours 09/27/20 1808 09/28/20 1054   09/28/20 0900  ceFEPIme (MAXIPIME) 1 g in sodium chloride 0.9 % 100 mL IVPB  Status:  Discontinued        1 g 200 mL/hr over 30 Minutes Intravenous Every 12 hours 09/27/20 1808 09/27/20 1812   09/27/20 1445  vancomycin (VANCOREADY) IVPB 2000 mg/400 mL        2,000 mg 200 mL/hr over 120 Minutes Intravenous  Once 09/27/20  1433 09/27/20 2330   09/27/20 1430  ceFEPIme (MAXIPIME) 2 g in sodium chloride 0.9 % 100 mL IVPB        2 g 200 mL/hr over 30 Minutes Intravenous  Once 09/27/20 1418 09/27/20 1541   09/27/20 1430  metroNIDAZOLE (FLAGYL) IVPB 500 mg        500 mg 100 mL/hr over 60 Minutes Intravenous  Once 09/27/20 1418 09/27/20 2214   09/27/20 1430  vancomycin (VANCOREADY) IVPB 1750 mg/350 mL  Status:  Discontinued        1,750 mg 175 mL/hr over 120 Minutes Intravenous  Once 09/27/20 1418 09/27/20 1433     Subjective: Moaning, unintelligible speech  Objective: Vitals:   09/29/20 1248 09/29/20 1250 09/29/20 1300 09/29/20 1330  BP: (!) 169/120 (!) 156/116 (!) 165/112 (!) (P) 158/82   Pulse: (!) 114 (!) 122 (!) 125 (!) (P) 114  Resp: (!) 22  (!) 27   Temp:      TempSrc:      SpO2:      Weight:      Height:        Intake/Output Summary (Last 24 hours) at 09/29/2020 1411 Last data filed at 09/28/2020 1558 Gross per 24 hour  Intake 513.95 ml  Output --  Net 513.95 ml   Filed Weights   09/27/20 1400 09/29/20 1240  Weight: 83.9 kg 84.3 kg    Examination:  General exam: appears uncomfortable, moaning - appears stated age Respiratory system: CTAB, no adventitious lung sounds Cardiovascular system: S1 & S2 heard, tachycardic - regular Gastrointestinal system: Abdomen is nondistended, soft, mildly diffusely tender Central nervous system: Alert, attends to voice, but does not consistently follow commands for neuro exam.  Extremities: no LEE  Data Reviewed: I have personally reviewed following labs and imaging studies  CBC: Recent Labs  Lab 09/27/20 1125 09/28/20 0436 09/29/20 1301  WBC 23.2* 29.6* 41.8*  HGB 11.0* 9.3* 9.9*  HCT 32.6* 28.1* 29.8*  MCV 82.3 81.7 81.6  PLT 83* 88* 124*    Basic Metabolic Panel: Recent Labs  Lab 09/27/20 1125 09/27/20 1416 09/28/20 0436 09/29/20 1301  NA 137 137 135 138  K 4.4 4.6 5.0 5.4*  CL 92* 92* 96* 96*  CO2 '25 23 22 '$ 17*  GLUCOSE 86 93 119* 94  BUN 45* 48* 76* 123*  CREATININE 9.82* 10.30* 11.10* 13.25*  CALCIUM 8.8* 8.6* 8.0* 8.4*  PHOS  --   --   --  7.8*    GFR: Estimated Creatinine Clearance: 7.4 mL/min (Patrick Anthony) (by C-G formula based on SCr of 13.25 mg/dL (H)).  Liver Function Tests: Recent Labs  Lab 09/27/20 1416 09/29/20 1301  AST 42*  --   ALT 15  --   ALKPHOS 107  --   BILITOT 2.1*  --   PROT 8.4*  --   ALBUMIN 2.9* 2.3*    CBG: Recent Labs  Lab 09/28/20 0721 09/28/20 0928  GLUCAP 92 103*     Recent Results (from the past 240 hour(s))  Blood Culture (routine x 2)     Status: Abnormal (Preliminary result)   Collection Time: 09/27/20  2:16 PM   Specimen: BLOOD RIGHT HAND  Result  Value Ref Range Status   Specimen Description BLOOD RIGHT HAND  Final   Special Requests   Final    BOTTLES DRAWN AEROBIC AND ANAEROBIC Blood Culture results may not be optimal due to an inadequate volume of blood received in culture bottles   Culture  Setup Time  Final    GRAM POSITIVE COCCI IN CLUSTERS IN BOTH AEROBIC AND ANAEROBIC BOTTLES    Culture (Patrick Anthony)  Final    STAPHYLOCOCCUS AUREUS SUSCEPTIBILITIES TO FOLLOW Performed at [facility], [place] [address], [place], [place] [address]    Report Status PENDING  Incomplete  Resp Panel by RT-PCR (Flu Julann Mcgilvray&B, Covid) Nasopharyngeal Swab     Status: None   Collection Time: 09/27/20  2:16 PM   Specimen: Nasopharyngeal Swab; Nasopharyngeal(NP) swabs in vial transport medium  Result Value Ref Range Status   SARS Coronavirus 2 by RT PCR NEGATIVE NEGATIVE Final    Comment: (NOTE) SARS-CoV-2 target nucleic acids are NOT DETECTED.  The SARS-CoV-2 RNA is generally detectable in upper respiratory specimens during the acute phase of infection. The lowest concentration of SARS-CoV-2 viral copies this assay can detect is 138 copies/mL. Patrick Anthony negative result does not preclude SARS-Cov-2 infection and should not be used as the sole basis for treatment or other patient management decisions. Patrick Anthony negative result may occur with  improper specimen collection/handling, submission of specimen other than nasopharyngeal swab, presence of viral mutation(s) within the areas targeted by this assay, and inadequate number of viral copies(<138 copies/mL). Patrick Anthony negative result must be combined with clinical observations, patient history, and epidemiological information. The expected result is Negative.  Fact Sheet for Patients:  EntrepreneurPulse.com.au  Fact Sheet for Healthcare Providers:  IncredibleEmployment.be  This test is no t yet approved or cleared by the [place] FDA and  has been authorized for detection and/or  diagnosis of SARS-CoV-2 by FDA under an Emergency Use Authorization (EUA). This EUA will remain  in effect (meaning this test can be used) for the duration of the COVID-19 declaration under Section 564(b)(1) of the Act, 21 U.S.C.section 360bbb-3(b)(1), unless the authorization is terminated  or revoked sooner.       Influenza Benn Tarver by PCR NEGATIVE NEGATIVE Final   Influenza B by PCR NEGATIVE NEGATIVE Final    Comment: (NOTE) The Xpert Xpress SARS-CoV-2/FLU/RSV plus assay is intended as an aid in the diagnosis of influenza from Nasopharyngeal swab specimens and should not be used as Patrick Anthony sole basis for treatment. Nasal washings and aspirates are unacceptable for Xpert Xpress SARS-CoV-2/FLU/RSV testing.  Fact Sheet for Patients: EntrepreneurPulse.com.au  Fact Sheet for Healthcare Providers: IncredibleEmployment.be  This test is not yet approved or cleared by the [place] FDA and has been authorized for detection and/or diagnosis of SARS-CoV-2 by FDA under an Emergency Use Authorization (EUA). This EUA will remain in effect (meaning this test can be used) for the duration of the COVID-19 declaration under Section 564(b)(1) of the Act, 21 U.S.C. section 360bbb-3(b)(1), unless the authorization is terminated or revoked.  Performed at [facility], [place] [address] [place], [place] [address]   Blood Culture ID Panel (Reflexed)     Status: Abnormal   Collection Time: 09/27/20  2:16 PM  Result Value Ref Range Status   Enterococcus faecalis NOT DETECTED NOT DETECTED Final   Enterococcus Faecium NOT DETECTED NOT DETECTED Final   Listeria monocytogenes NOT DETECTED NOT DETECTED Final   Staphylococcus species DETECTED (Kyan Yurkovich) NOT DETECTED Final    Comment: CRITICAL RESULT CALLED TO, READ BACK BY AND VERIFIED WITH: C,PIERCE PHARMD '@1009'$  09/28/20 EB    Staphylococcus aureus (BCID) DETECTED (Rozlyn Yerby) NOT DETECTED Final    Comment: Methicillin  (oxacillin)-resistant Staphylococcus aureus (MRSA). MRSA is predictably resistant to beta-lactam antibiotics (except ceftaroline). Preferred therapy is vancomycin unless clinically contraindicated. Patient requires contact precautions if  hospitalized. CRITICAL RESULT CALLED TO, READ BACK BY AND VERIFIED WITH: C,PIERCE PHARMD '@1009'$  09/28/20 EB    Staphylococcus epidermidis NOT DETECTED NOT DETECTED Final   Staphylococcus lugdunensis NOT DETECTED NOT DETECTED Final   Streptococcus species NOT DETECTED NOT DETECTED Final   Streptococcus agalactiae NOT DETECTED NOT DETECTED Final   Streptococcus pneumoniae NOT DETECTED NOT DETECTED Final   Streptococcus pyogenes NOT DETECTED NOT DETECTED Final   Patrick Anthony.calcoaceticus-baumannii NOT DETECTED NOT DETECTED Final   Bacteroides fragilis NOT DETECTED NOT DETECTED Final   Enterobacterales NOT DETECTED NOT DETECTED Final   Enterobacter cloacae complex NOT DETECTED NOT DETECTED Final   Escherichia coli NOT DETECTED NOT DETECTED Final   Klebsiella aerogenes NOT DETECTED NOT DETECTED Final   Klebsiella oxytoca NOT DETECTED NOT DETECTED Final   Klebsiella pneumoniae NOT DETECTED NOT DETECTED Final   Proteus species NOT DETECTED NOT DETECTED Final   Salmonella species NOT DETECTED NOT DETECTED Final   Serratia marcescens NOT DETECTED NOT DETECTED Final   Haemophilus influenzae NOT DETECTED NOT DETECTED Final   Neisseria meningitidis NOT DETECTED NOT DETECTED Final   Pseudomonas aeruginosa NOT DETECTED NOT DETECTED Final   Stenotrophomonas maltophilia NOT DETECTED NOT DETECTED Final   Candida albicans NOT DETECTED NOT DETECTED Final   Candida auris NOT DETECTED NOT DETECTED Final   Candida glabrata NOT DETECTED NOT DETECTED Final   Candida krusei NOT DETECTED NOT DETECTED Final   Candida parapsilosis NOT DETECTED NOT DETECTED Final   Candida tropicalis NOT DETECTED NOT DETECTED Final   Cryptococcus neoformans/gattii NOT DETECTED NOT DETECTED Final   Meth  resistant mecA/C and MREJ DETECTED (Patrick Anthony) NOT DETECTED Final    Comment: CRITICAL RESULT CALLED TO, READ BACK BY AND VERIFIED WITH: C,PIERCE PHARMD '@1009'$  09/28/20 EB Performed at [facility], [address], [place], [place] [address]   Blood Culture (routine x 2)     Status: None (Preliminary result)   Collection Time: 09/27/20  2:29 PM   Specimen: BLOOD  Result Value Ref Range Status   Specimen Description BLOOD SITE NOT SPECIFIED  Final   Special Requests   Final    BOTTLES DRAWN AEROBIC AND ANAEROBIC Blood Culture results may not be optimal due to an inadequate volume of blood received in culture bottles   Culture  Setup Time   Final    GRAM POSITIVE COCCI IN CLUSTERS ANAEROBIC BOTTLE ONLY CRITICAL VALUE NOTED.  VALUE IS CONSISTENT WITH PREVIOUSLY REPORTED AND CALLED VALUE. Performed at [facility], [place] [address] [place], [place] [address]    Culture [facility] POSITIVE COCCI  Final   Report Status PENDING  Incomplete         Radiology Studies: CT ABDOMEN PELVIS WO CONTRAST  Result Date: 09/27/2020 CLINICAL DATA:  Abdominal pain.  Chronic renal failure EXAM: CT ABDOMEN AND PELVIS WITHOUT CONTRAST TECHNIQUE: Multidetector CT imaging of the abdomen and pelvis was performed following the standard protocol without IV contrast. COMPARISON:  None. FINDINGS: Lower chest: There is bibasilar atelectatic change. There is no lung base edema or consolidation. There are multiple foci of coronary artery calcification. Hepatobiliary: No focal liver lesions are evident on this noncontrast enhanced study. Gallbladder wall is not appreciably thickened. There is no biliary duct dilatation. Pancreas: No pancreatic mass or inflammatory focus. Spleen: No splenic lesions are evident. Adrenals/Urinary Tract: Adrenals bilaterally appear unremarkable. Innumerable cysts are noted throughout each kidney, several of which show peripheral calcification. There is Breauna Anthony calcified mass arising from the periphery  of the lower  pole of the left kidney measuring 1.9 x 1.1 cm. Largest well-defined individual cyst is seen arising from the lateral right kidney measuring 2.6 x 2.2 cm. There is no appreciable hydronephrosis on either side. There are scattered suspected 1 mm calculi in each kidney in addition to areas of suspected calcification in the periphery of multiple cysts. There is no ureteral calculus on either side. Urinary bladder is midline with equivocal urinary bladder wall thickening. Stomach/Bowel: There is no appreciable bowel wall or mesenteric thickening. There is no appreciable bowel obstruction. The terminal ileum appears normal. There is no appreciable free air or portal venous air. Vascular/Lymphatic: There is no abdominal aortic aneurysm. There is aortic atherosclerosis. There are also foci of calcification major pelvic and mesenteric arterial vessels. There is no appreciable adenopathy in the abdomen or pelvis. Reproductive: Prostate and seminal vesicles are normal in size and contour. There are calcifications in seminal vesicles bilaterally. Other: Appendix appears normal. No evident abscess or ascites in the abdomen or pelvis. There is slight fat in the umbilicus. Musculoskeletal: Postoperative changes noted in the proximal right femur. Several metallic pellets are noted in the proximal right femur region. Degenerative change noted in the lumbar spine with vacuum phenomenon at L5-S1 and to Jirah Rider lesser degree at L4-5. No blastic or lytic bone lesions are evident. No intramuscular lesions. IMPRESSION: 1. Findings indicative of adult polycystic kidney disease. Multiple calcifications are noted in the periphery of several cysts which is typical with this entity. Patrick Anthony suspected collapsed cyst with calcification is noted in the periphery of the left kidney measuring 1.9 x 1.1 cm. 2. Scattered 1 mm calculi in each kidney. No hydronephrosis or ureteral calculus on either side. Equivocal thickening of the urinary bladder  wall could indicate Betzabe Bevans degree of cystitis. Correlation with urinalysis in this regard advised. 3. No evident bowel obstruction. No abscess in the abdomen or pelvis. Appendix appears normal. 4. Aortic Atherosclerosis (ICD10-I70.0). Multiple foci of pelvic and mesenteric arterial vascular calcification noted. There are foci of coronary artery calcification. Electronically Signed   By: Lowella Grip III M.D.   On: 09/27/2020 15:25   CT HEAD WO CONTRAST  Result Date: 09/28/2020 CLINICAL DATA:  Delirium. Additional history provided by Dr. Rory Percy: Patient perseverating. EXAM: CT HEAD WITHOUT CONTRAST TECHNIQUE: Contiguous axial images were obtained from the base of the skull through the vertex without intravenous contrast. COMPARISON:  No pertinent prior exams available for comparison. FINDINGS: Brain: Mild cerebral and cerebellar atrophy. Mild patchy and ill-defined hypoattenuation within the cerebral white matter is nonspecific, but compatible with chronic small vessel ischemic disease. 10 mm infarct within the left cerebellar hemisphere, possibly acute (series 2, image 7) (series 4, image 54). There is no acute intracranial hemorrhage. No demarcated cortical infarct. No extra-axial fluid collection. No evidence of intracranial mass. No midline shift. Vascular: No hyperdense vessel.  Atherosclerotic calcifications Skull: Normal. Negative for fracture or focal lesion. Sinuses/Orbits: Visualized orbits show no acute finding. Trace frontal, ethmoid and sphenoid sinus mucosal thickening at the imaged levels. ASPECTS ([place] Stroke Program Early CT Score) - Ganglionic level infarction (caudate, lentiform nuclei, internal capsule, insula, M1-M3 cortex): 7 - Supraganglionic infarction (M4-M6 cortex): 3 Total score (0-10 with 10 being normal): 10 These results were called by telephone at the time of interpretation on 09/28/2020 at 8:18 am to provider Dr. Rory Percy, who verbally acknowledged these results. IMPRESSION: 10 mm  infarct within the medial left cerebellar hemisphere, which may be acute. Consider brain MRI for further evaluation. No acute intracranial  hemorrhage or acute demarcated cortical infarct. Background mild generalized atrophy of the brain and chronic small vessel ischemic disease. Electronically Signed   By: Kellie Simmering DO   On: 09/28/2020 08:20   MR ANGIO HEAD WO CONTRAST  Result Date: 09/28/2020 CLINICAL DATA:  Neuro deficit, acute, stroke suspected. EXAM: MRI HEAD WITHOUT CONTRAST MRA HEAD WITHOUT CONTRAST TECHNIQUE: Multiplanar, multiecho pulse sequences of the brain and surrounding structures were obtained without intravenous contrast. Angiographic images of the head were obtained using MRA technique without contrast. COMPARISON:  Noncontrast head CT performed earlier today 09/28/2020. FINDINGS: MRI HEAD FINDINGS Brain: Mild cerebral and cerebellar atrophy. There are scattered small acute cortical and subcortical infarcts within the bilateral frontal, bilateral parietal, bilateral occipital and left temporal lobes. Small acute infarcts are also present within the midline callosum splenium, right caudate nucleus and bilateral cerebellar hemispheres. The largest acute infarct is present cortical/subcortical right frontal lobe and measures 2.3 x 0.5 cm (series 5, image 21). 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 Patrick Anthony few small scattered supratentorial chronic microhemorrhages are also present. No evidence of intracranial mass. No extra-axial fluid collection. No midline shift. Vascular: Expected proximal arterial flow voids. Skull and upper cervical spine: No focal marrow lesion. Sinuses/Orbits: Visualized orbits show no acute finding. Mild bilateral ethmoid sinus mucosal thickening. MRA HEAD FINDINGS The intracranial internal carotid arteries are patent. The M1 middle cerebral arteries are patent. No M2 proximal branch occlusion or high-grade proximal  stenosis is identified. The anterior cerebral arteries are patent. The intracranial vertebral arteries are patent. The basilar artery is patent. The posterior cerebral arteries are patent. Posterior communicating arteries are present bilaterally. 2 mm infundibulum at the origin of the left posterior communicating artery. MRI head Impression #1 will be called to the ordering clinician or representative by the Radiologist Assistant, and communication documented in the PACS or [employer]. IMPRESSION: MRI head: 1. 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 Multiple vascular territories are involved and findings are highly suspicious for an embolic process. 2. 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 Cerria Randhawa few scattered supratentorial chronic microhemorrhages are also present. 3. Mild generalized atrophy of the brain. MRA head: No intracranial large vessel occlusion or proximal high-grade arterial stenosis. Electronically Signed   By: Kellie Simmering DO   On: 09/28/2020 09:41   MR ANGIO NECK WO CONTRAST  Result Date: 09/28/2020 CLINICAL DATA:  Neuro deficit, acute, stroke suspected. EXAM: MRA NECK WITHOUT CONTRAST TECHNIQUE: Angiographic images of the neck were obtained using MRA technique without intravenous contrast. Carotid stenosis measurements (when applicable) are obtained utilizing NASCET criteria, using the distal internal carotid diameter as the denominator. COMPARISON:  Non-contrast head CT performed earlier today 09/28/2020. FINDINGS: The origins of the common carotid and vertebral arteries are excluded from the field of view. The visualized common carotid arteries and internal carotid arteries are patent within the neck without appreciable stenosis. The visualized vertebral arteries are patent within the neck without appreciable stenosis.  IMPRESSION: The visualized common carotid, internal carotid and vertebral arteries patent within the neck without appreciable stenosis. Electronically Signed   By: Kellie Simmering DO   On: 09/28/2020 09:53   MR BRAIN WO CONTRAST  Result Date: 09/28/2020 CLINICAL DATA:  Neuro deficit, acute, stroke suspected. EXAM: MRI HEAD WITHOUT CONTRAST MRA HEAD WITHOUT CONTRAST TECHNIQUE: Multiplanar, multiecho pulse sequences of the brain and surrounding structures were obtained  without intravenous contrast. Angiographic images of the head were obtained using MRA technique without contrast. COMPARISON:  Noncontrast head CT performed earlier today 09/28/2020. FINDINGS: MRI HEAD FINDINGS Brain: Mild cerebral and cerebellar atrophy. There are scattered small acute cortical and subcortical infarcts within the bilateral frontal, bilateral parietal, bilateral occipital and left temporal lobes. Small acute infarcts are also present within the midline callosum splenium, right caudate nucleus and bilateral cerebellar hemispheres. The largest acute infarct is present cortical/subcortical right frontal lobe and measures 2.3 x 0.5 cm (series 5, image 21). 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 Oris Staffieri few small scattered supratentorial chronic microhemorrhages are also present. No evidence of intracranial mass. No extra-axial fluid collection. No midline shift. Vascular: Expected proximal arterial flow voids. Skull and upper cervical spine: No focal marrow lesion. Sinuses/Orbits: Visualized orbits show no acute finding. Mild bilateral ethmoid sinus mucosal thickening. MRA HEAD FINDINGS The intracranial internal carotid arteries are patent. The M1 middle cerebral arteries are patent. No M2 proximal branch occlusion or high-grade proximal stenosis is identified. The anterior cerebral arteries are patent. The intracranial vertebral arteries are patent. The basilar artery is patent. The  posterior cerebral arteries are patent. Posterior communicating arteries are present bilaterally. 2 mm infundibulum at the origin of the left posterior communicating artery. MRI head Impression #1 will be called to the ordering clinician or representative by the Radiologist Assistant, and communication documented in the PACS or [employer]. IMPRESSION: MRI head: 1. 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 Multiple vascular territories are involved and findings are highly suspicious for an embolic process. 2. 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 Shakayla Hickox few scattered supratentorial chronic microhemorrhages are also present. 3. Mild generalized atrophy of the brain. MRA head: No intracranial large vessel occlusion or proximal high-grade arterial stenosis. Electronically Signed   By: Kellie Simmering DO   On: 09/28/2020 09:41   DG Chest Port 1 View  Result Date: 09/27/2020 CLINICAL DATA:  Questionable sepsis evaluate for abnormality. Presenting from dialysis with acute complaint of body aches, chills and shortness of breath EXAM: PORTABLE CHEST 1 VIEW COMPARISON:  Shoulder radiograph September 17, 2020 and chest radiograph August 29, 2013 FINDINGS: Cardiomegaly. Low lung volumes. Streaky left basilar opacities. No significant pleural effusion or visible pneumothorax. Left vascular stent. Similar appearance of the mass like calcific density over the right shoulder. There is hazy calcification along the left shoulder. IMPRESSION: 1. Low lung volumes with streaky left basilar opacities which could represent atelectasis or infection. Electronically Signed   By: Dahlia Bailiff MD   On: 09/27/2020 14:59   EEG adult  Result Date: 09/28/2020 [name], MD     09/28/2020  4:33 PM Patient Name: Frantz Ciardi MRN: [id number] Epilepsy Attending: [name] Referring Physician/Provider: Dr Amie [place] Date: 09/28/2020 Duration: 30.40 mins Patient history: 50yo M with ams. EEG to evaluate for seizure. Level of alertness:  lethargic AEDs during EEG study: GBP Technical aspects: This EEG study was done with scalp electrodes positioned according to the 10-20 International system of electrode placement. Electrical activity was acquired at Imane Burrough sampling rate of '500Hz'$  and reviewed with Lataja Newland high frequency filter of '70Hz'$  and Milinda Sweeney low frequency filter of '1Hz'$ . EEG data were recorded continuously and digitally stored. Description: EEG initially showed periodic discharges with triphasic morphology at 2-2.'5hz'$  were also noted. IV ativan '2mg'$  was administered after which  EEG showed continuous  generalized 3 to 6 Hz theta-delta slowing. One episode of right arm tremor was noted at 1528 . Concomitant eeg before, during and after the event showed generalized periodic discharges with triphasic morphology at 2-2.5 without definite evolution.  Hyperventilation and photic stimulation were not performed.   ABNORMALITY - Periodic discharges with triphasic morphology, generalized -Continuous slow, generalized IMPRESSION: This study initially showed generalized periodic discharges with triphasic morphology at 2-2.'5hz'$ . This eeg pattern can be on the ictal-interictal continuum and also seen with toxic-metabolic etiology. EEG improved after IV ativan which although not confirmatory is concerning for ictal nature of this pattern. There is also moderate diffuse encephalopathy, non specific etiology. One episode of right arm tremor was noted at 1528 during which eeg showed generalized periodic discharges with triphasic morphology at 2-2.5 without definite evolution. However, focal motor seizures may not be seen on scalp eeg. Therefore, clinical correlation is recommended. [name]   ECHOCARDIOGRAM COMPLETE BUBBLE STUDY  Result Date: 09/28/2020    ECHOCARDIOGRAM REPORT   Patient Name:    ANANIAS BLILEY Date of Exam: 09/28/2020 Medical Rec #:  [id number]      Height:       72.0 in Accession #:    [id number]     Weight:       185.0 lb Date of Birth:  June 09, 1971      BSA:          2.061 m Patient Age:    46 years       BP:           134/92 mmHg Patient Gender: M              HR:           101 bpm. Exam Location:  Inpatient Procedure: 2D Echo, Cardiac Doppler, Color Doppler and Saline Contrast Bubble            Study Indications:    CVA  History:        Patient has prior history of Echocardiogram examinations, most                 recent 10/23/2019. CHF, Signs/Symptoms:Fever; Risk                 Factors:Hypertension. ESRD, sepsis, PNA, drug abuse.  Sonographer:    Dustin Flock Referring Phys: [id number] [place]  1. Left ventricular ejection fraction, by estimation, is 55 to 60%. The left ventricle has normal function. The left ventricle has no regional wall motion abnormalities. There is moderate concentric left ventricular hypertrophy. Left ventricular diastolic parameters are consistent with Grade II diastolic dysfunction (pseudonormalization).  2. Right ventricular systolic function is normal. The right ventricular size is normal. There is normal pulmonary artery systolic pressure.  3. Left atrial size was moderately dilated.  4. Right atrial size was mild to moderately dilated.  5. The mitral valve is normal in structure. Mild to moderate mitral valve regurgitation. No evidence of mitral stenosis. Moderate to severe mitral annular calcification.  6. The aortic valve is tricuspid. Aortic valve regurgitation is trivial. No aortic stenosis is present.  7. The inferior vena cava is normal in size with greater than 50% respiratory variability, suggesting right atrial pressure of 3 mmHg.  8. Agitated saline contrast bubble study was negative, with no evidence of any interatrial shunt. Conclusion(s)/Recommendation(s): No intracardiac source of embolism detected on this transthoracic  study. Kelyn Koskela transesophageal echocardiogram is recommended to exclude cardiac source of embolism  if clinically indicated. FINDINGS  Left Ventricle: Left ventricular ejection fraction, by estimation, is 55 to 60%. The left ventricle has normal function. The left ventricle has no regional wall motion abnormalities. The left ventricular internal cavity size was normal in size. There is  moderate concentric left ventricular hypertrophy. Left ventricular diastolic parameters are consistent with Grade II diastolic dysfunction (pseudonormalization). Right Ventricle: The right ventricular size is normal. Right vetricular wall thickness was not well visualized. Right ventricular systolic function is normal. There is normal pulmonary artery systolic pressure. The tricuspid regurgitant velocity is 2.51 m/s, and with an assumed right atrial pressure of 3 mmHg, the estimated right ventricular systolic pressure is [id number] mmHg. Left Atrium: Left atrial size was moderately dilated. Right Atrium: Right atrial size was mild to moderately dilated. Pericardium: Trivial pericardial effusion is present. Mitral Valve: The mitral valve is normal in structure. There is mild thickening of the mitral valve leaflet(s). There is moderate calcification of the mitral valve leaflet(s). Moderate to severe mitral annular calcification. Mild to moderate mitral valve  regurgitation. No evidence of mitral valve stenosis. Tricuspid Valve: The tricuspid valve is normal in structure. Tricuspid valve regurgitation is trivial. No evidence of tricuspid stenosis. Aortic Valve: The aortic valve is tricuspid. Aortic valve regurgitation is trivial. No aortic stenosis is present. Pulmonic Valve: The pulmonic valve was grossly normal. Pulmonic valve regurgitation is trivial. No evidence of pulmonic stenosis. Aorta: The aortic root, ascending aorta, aortic arch and descending aorta are all structurally normal, with no evidence of dilitation or obstruction. Venous: The  inferior vena cava is normal in size with greater than 50% respiratory variability, suggesting right atrial pressure of 3 mmHg. IAS/Shunts: The atrial septum is grossly normal. Agitated saline contrast was given intravenously to evaluate for intracardiac shunting. Agitated saline contrast bubble study was negative, with no evidence of any interatrial shunt.  LEFT VENTRICLE PLAX 2D LVIDd:         4.80 cm  Diastology LVIDs:         3.20 cm  LV e' medial:    11.10 cm/s LV PW:         1.50 cm  LV E/e' medial:  12.2 LV IVS:        1.60 cm  LV e' lateral:   6.74 cm/s LVOT diam:     2.20 cm  LV E/e' lateral: 20.0 LV SV:         49 LV SV Index:   24 LVOT Area:     3.80 cm  RIGHT VENTRICLE RV Basal diam:  2.60 cm RV S prime:     19.00 cm/s TAPSE (M-mode): 3.6 cm LEFT ATRIUM             Index       RIGHT ATRIUM           Index LA diam:        4.50 cm 2.18 cm/m  RA Area:     20.20 cm LA Vol (A2C):   72.7 ml 35.27 ml/m RA Volume:   56.10 ml  27.22 ml/m LA Vol (A4C):   98.8 ml 47.94 ml/m LA Biplane Vol: 87.1 ml 42.26 ml/m  AORTIC VALVE LVOT Vmax:   113.00 cm/s LVOT Vmean:  71.300 cm/s LVOT VTI:    0.128 m  AORTA Ao Root diam: 3.60 cm MITRAL VALVE                TRICUSPID VALVE MV Area (PHT): 5.13 cm     TR  Peak grad:   25.2 mmHg MV Decel Time: 148 msec     TR Vmax:        251.00 cm/s MV E velocity: 135.00 cm/s MV Kaniya Trueheart velocity: 147.00 cm/s  SHUNTS MV E/Jaimey Franchini ratio:  0.92         Systemic VTI:  0.13 m                             Systemic Diam: 2.20 cm Buford Dresser MD Electronically signed by Buford Dresser MD Signature Date/Time: 09/28/2020/6:58:41 PM    Final         Scheduled Meds: . calcium acetate  667 mg Oral TID WC  . calcium acetate  667 mg Oral With snacks  . Chlorhexidine Gluconate Cloth  6 each Topical Q0600  . cinacalcet  30 mg Oral Q breakfast  . cloNIDine  0.3 mg Oral TID   Continuous Infusions: . sodium chloride    . sodium chloride    . vancomycin       LOS: 2 days    Time  spent: over 30 min    Fayrene Helper, MD [facility]   To contact the attending provider between 7A-7P or the covering provider during after hours 7P-7A, please log into the web site [email or web address] and access using universal [place] password for that web site. If you do not have the password, please call the hospital operator.  09/29/2020, 2:11 PM

## 2020-09-29 NOTE — Progress Notes (Signed)
MD notified of BP 150/108, HR 115,  continue to monitor HR, notify MD if continue to increase.

## 2020-09-29 NOTE — Progress Notes (Signed)
D/c unhook EEG

## 2020-09-29 NOTE — Progress Notes (Signed)
[facility] Progress Note  Subjective:    Vitals:   09/29/20 0340 09/29/20 0551 09/29/20 0552 09/29/20 0700  BP: (!) 162/114 (!) 158/111  (!) 147/109  Pulse: (!) 112 (!) 108  (!) 107  Resp: 19 (!) 22  (!) 22  Temp: 98.4 F (36.9 C) 98.7 F (37.1 C) 98.7 F (37.1 C) 98.5 F (36.9 C)  TempSrc: Oral Axillary Axillary Oral  SpO2: 100% 99%  100%  Weight:      Height:        Exam:   Drowsy, awakens, nonverbal, murmurs responses  no jvd  Chest cta bilat  Cor reg no RG  Abd soft ntnd no ascites   Ext no LE edema   Alert, NF, ox3   LUA AVG +bruit     OP HD: [facility]   4h  350/500  82.5kg  2/2.5 bath  P1  Hep 5500  LUA AVG   Aranesp 25mg q2wks - last 09/27/20   Hectorol 334m IV qHD    Assessment/ Plan: 1. Fevers/ chills - +MRSA bacteremia w/ PNA per ID notes. High fevers partially improving. No HD cath. Has AVG LUA w/o gross signs of infection. Pt had acute CVA after admission, likely embolic, per primary team. Awaiting TEE/ TTE, getting IV vanc.   2. Acute CVA/ AMS - pt aphasic, MRI + for embolic strokes 3. ESRD - usual HD MWF.  HD today on schedule.   4. Hypertension/volume  - Blood pressure chronically hard to control. Does not appear grossly volume overloaded on exam. 1kg up.  UF as tolerated.  5. Anemia of CKD - Hgb 9.3.  ESA recently dosed.  6. Secondary Hyperparathyroidism -  Ca in goal.  Will check phos.  Continue VDRA, sensipar and binders once eating.  7. Nutrition - Renal diet w/fluid restrictions once advanced.  8. Hx Hep C /liver cirrhosis - per ID   Rob Bradie Sangiovanni 09/29/2020, 11:01 AM   Recent Labs  Lab 09/27/20 1125 09/27/20 1416 09/28/20 0436  K 4.4 4.6 5.0  BUN 45* 48* 76*  CREATININE 9.82* 10.30* 11.10*  CALCIUM 8.8* 8.6* 8.0*  HGB 11.0*  --  9.3*   Inpatient medications: . calcium acetate  667 mg Oral TID WC  . calcium acetate  667 mg Oral With snacks  . Chlorhexidine Gluconate Cloth  6 each Topical Q0600  . cinacalcet  30 mg  Oral Q breakfast  . cloNIDine  0.3 mg Oral TID   . vancomycin     acetaminophen **OR** acetaminophen (TYLENOL) oral liquid 160 mg/5 mL **OR** acetaminophen, albuterol, ondansetron **OR** ondansetron (ZOFRAN) IV, oxyCODONE, polyethylene glycol

## 2020-09-29 NOTE — Progress Notes (Signed)
Cardiology Consultation:   Patient ID: Nhan Marandola MRN: [id number]; DOB: 13-Dec-1970  Admit date: 09/27/2020 Date of Consult: 09/29/2020  PCP:  [name], MD   [place]  Cardiologist:  Fransico Him, MD  Advanced Practice Provider:  No care team member to display Electrophysiologist:  None     Patient Profile:   Geron Dase is a 50 y.o. male with a hx of end-stage renal disease, hypertension, obstructive sleep apnea, chronic diastolic congestive heart failure, liver cirrhosis due to hepatitis C, heavy alcohol abuse.  He was brought to the hospital with generalized weakness.  Chest x-ray showed a left lower lobe pneumonia.  Blood cultures have grown out staph aureus.  Who is being seen today for the evaluation of recent stroke and possible endocarditis at the request of Dr. Florene Glen.  History of Present Illness:   Mr. Bankes is a 50 year old gentleman with a history of chronic diastolic congestive heart failure, OSA, hypertension, ESRD, liver cirrhosis.  He is brought to the hospital with generalized weakness and some neurologic changes.  He has a left lower lobe pneumonia by chest x-ray.  Because of his mental status changes brain MRI was performed.  He had scattered small acute cortical and subcortical infarcts.  There are multiple territories involved which is suspicious for an embolic process.   He was examined in HD today  WBC is 41.8K  He is encephalopathic this afternoon.  Unable to cooperate with the exam . Unable to answer any questons Occasionally moans and writhes in bed   Past Medical History:  Diagnosis Date  . Chronic kidney disease    patient on dialysis tues-thurs-sat  . Cirrhosis of liver ([place])   . Drug addiction ([place])   . ETOH abuse   . Hepatitis   . Hypertension   . Renal disorder renal failure  . Sleep apnea   . Systolic and diastolic CHF, 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[place]) 04/19/2013    Past Surgical History:  Procedure  Laterality Date  . AV FISTULA PLACEMENT Right   . AV FISTULA PLACEMENT Left 09/03/2013   Procedure: ARTERIOVENOUS (AV) FISTULA CREATION - LEFT BRACHIOCEPHALIC;  Surgeon: Conrad Industry, MD;  Location: [facility] OR;  Service: Vascular;  Laterality: Left;  . AV FISTULA PLACEMENT Left 10/30/2019   Procedure: INSERTION OF ARTERIOVENOUS (AV) GORE-TEX GRAFT ARM;  Surgeon: Rosetta Posner, MD;  Location: [place];  Service: Vascular;  Laterality: Left;  . INSERTION OF DIALYSIS CATHETER N/A 08/29/2013   Procedure: INSERTION OF DIALYSIS CATHETER;  Surgeon: Mal Misty, MD;  Location: [place];  Service: Vascular;  Laterality: N/A;  . INSERTION OF DIALYSIS CATHETER Left 10/30/2019   Procedure: INSERTION OF DIALYSIS CATHETER LEFT INTERNAL JUGULAR;  Surgeon: Rosetta Posner, MD;  Location: [place];  Service: Vascular;  Laterality: Left;  . PERIPHERAL VASCULAR CATHETERIZATION N/A 01/06/2015   Procedure: A/V Shuntogram/Fistulagram;  Surgeon: Katha Cabal, MD;  Location: [place] [facility];  Service: Cardiovascular;  Laterality: N/A;  . PERIPHERAL VASCULAR CATHETERIZATION Left 01/06/2015   Procedure: A/V Shunt Intervention;  Surgeon: Katha Cabal, MD;  Location: [place] [facility];  Service: Cardiovascular;  Laterality: Left;     Home Medications:  Prior to Admission medications   Medication Sig Start Date End Date Taking? Authorizing Provider  amLODipine (NORVASC) 10 MG tablet Take 10 mg by mouth every morning. 09/24/20  Yes [provider]  AURYXIA 1 GM 210 MG(Fe) tablet Take 420 mg by mouth 3 (three) times daily. 07/20/20  Yes [provider]  calcium acetate (PHOSLO) 667 MG capsule Take 3 capsules (2,001 mg total) by mouth 3 (three) times daily with meals. Patient taking differently: Take 667-2,001 mg by mouth See admin instructions. Take 3 capsules (2001 mg) by mouth with each meal & take 1 capsule (667 mg) by mouth with each snack 09/04/13  Yes Ivor Costa, MD  cloNIDine (CATAPRES) 0.3 MG tablet  Take 0.3 mg by mouth in the morning, at noon, and at bedtime.   Yes [provider]  Darbepoetin Alfa (ARANESP, ALBUMIN FREE, IJ) Darbepoetin Alfa (Aranesp) 07/05/20 07/04/21 Yes [provider]  gabapentin (NEURONTIN) 100 MG capsule Take 200 mg by mouth at bedtime.   Yes [provider]  heparin 1000 unit/mL SOLN injection Heparin Sodium (Porcine) 1,000 Units/mL Systemic 04/23/20 04/22/21 Yes [provider]  hydrALAZINE (APRESOLINE) 100 MG tablet Take 100 mg by mouth 2 (two) times daily. 05/11/20  Yes [provider]  metoprolol tartrate (LOPRESSOR) 25 MG tablet Take 75 mg by mouth 2 (two) times daily.  04/12/16  Yes [provider]  nitroGLYCERIN (NITROSTAT) 0.4 MG SL tablet Place 0.4 mg under the tongue every 5 (five) minutes x 3 doses as needed for chest pain.    Yes [provider]  SENSIPAR 30 MG tablet Take 30 mg by mouth daily. 09/15/19  Yes [provider]  VIAGRA 50 MG tablet Take 50 mg by mouth daily as needed for erectile dysfunction.  09/24/19  Yes [provider]  oxyCODONE-acetaminophen (PERCOCET) 7.5-325 MG tablet Take 1 tablet by mouth every 4 (four) hours as needed for severe pain. Patient not taking: No sig reported 10/30/19 10/29/20  Karoline Caldwell, PA-C    Inpatient Medications: Scheduled Meds: . calcium acetate  667 mg Oral TID WC  . calcium acetate  667 mg Oral With snacks  . Chlorhexidine Gluconate Cloth  6 each Topical Q0600  . cinacalcet  30 mg Oral Q breakfast  . cloNIDine  0.3 mg Oral TID  . metoprolol tartrate  2.5 mg Intravenous Q6H   Continuous Infusions: . sodium chloride    . sodium chloride    . vancomycin 1,000 mg (09/29/20 1440)   PRN Meds: sodium chloride, sodium chloride, acetaminophen **OR** acetaminophen (TYLENOL) oral liquid 160 mg/5 mL **OR** acetaminophen, albuterol, alteplase, heparin, HYDROmorphone (DILAUDID) injection, lidocaine (PF), lidocaine-prilocaine, ondansetron  **OR** ondansetron (ZOFRAN) IV, oxyCODONE, pentafluoroprop-tetrafluoroeth, polyethylene glycol  Allergies:   No Known Allergies  Social History:   Social History   Socioeconomic History  . Marital status: Single    Spouse name: Not on file  . Number of children: Not on file  . Years of education: Not on file  . Highest education level: Not on file  Occupational History  . Not on file  Tobacco Use  . Smoking status: Light Tobacco Smoker    Packs/day: 0.20    Types: Cigarettes  . Smokeless tobacco: Never Used  . Tobacco comment: couple puffs per day  Vaping Use  . Vaping Use: Never used  Substance and Sexual Activity  . Alcohol use: No  . Drug use: No    Comment: Quit  . Sexual activity: Not Currently    Birth control/protection: Condom  Other Topics Concern  . Not on file  Social History Narrative   ** Merged History Encounter **       Social Determinants of Health   Financial Resource Strain: Not on file  Food Insecurity: Not on file  Transportation Needs: Not on file  Physical Activity: Not on file  Stress: Not on file  Social Connections: Not on file  Intimate Partner Violence: Not on file    Family History:    Family History  Problem Relation Age of Onset  . Hypertension Mother   . Cancer Mother        breast     ROS:  Please see the history of present illness.   All other ROS reviewed and negative.     Physical Exam/Data:   Vitals:   09/29/20 1400 09/29/20 1430 09/29/20 1500 09/29/20 1530  BP: (!) 157/105 (!) 146/93 (!) 159/97 (!) 176/98  Pulse: (!) 127 (!) 122 (!) 125 (!) 120  Resp: (!) 27 (!) 37 (!) 28 (!) 25  Temp:      TempSrc:      SpO2:      Weight:      Height:       No intake or output data in the 24 hours ending 09/29/20 1628 Last 3 Weights 09/29/2020 09/27/2020 10/30/2019  Weight (lbs) 185 lb 13.6 oz 185 lb 189 lb  Weight (kg) 84.3 kg 83.915 kg 85.73 kg     Body mass index is 25.21 kg/m.  General:   Moderate distress, unable to  communicate,  Pushes my hand away as I listen to his heart  HEENT: normal Lymph: no adenopathy Neck: no JVD Endocrine:  No thryomegaly Vascular: No carotid bruits; FA pulses 2+ bilaterally without bruits  Cardiac:  normal S1, S2; RRR;  99991111 systolic murmur radiating to the axilla , tachycardic  Lungs:  clear to auscultation bilaterally, no wheezing, rhonchi or rales  Abd: soft, nontender, no hepatomegaly  Ext: no edema Musculoskeletal:  No deformities, BUE and BLE strength normal and equal Skin: warm and dry  Neuro:  CNs 2-12 intact, no focal abnormalities noted Psych:  Normal affect    EKG:  The EKG was personally reviewed and demonstrates:    Sinus tach with no St or T wave changes  Telemetry:  Telemetry was personally reviewed and demonstrates:  Sinus tach   Relevant CV Studies:   Laboratory Data:  High Sensitivity Troponin:  No results for input(s): TROPONINIHS in the last 720 hours.   Chemistry Recent Labs  Lab 09/27/20 1416 09/28/20 0436 09/29/20 1301  NA 137 135 138  K 4.6 5.0 5.4*  CL 92* 96* 96*  CO2 23 22 17*  GLUCOSE 93 119* 94  BUN 48* 76* 123*  CREATININE 10.30* 11.10* 13.25*  CALCIUM 8.6* 8.0* 8.4*  GFRNONAA 6* 5* 4*  ANIONGAP 22* 17* 25*    Recent Labs  Lab 09/27/20 1416 09/29/20 1301  PROT 8.4*  --   ALBUMIN 2.9* 2.3*  AST 42*  --   ALT 15  --   ALKPHOS 107  --   BILITOT 2.1*  --    Hematology Recent Labs  Lab 09/27/20 1125 09/28/20 0436 09/29/20 1301  WBC 23.2* 29.6* 41.8*  RBC 3.96* 3.44* 3.65*  HGB 11.0* 9.3* 9.9*  HCT 32.6* 28.1* 29.8*  MCV 82.3 81.7 81.6  MCH 27.8 27.0 27.1  MCHC 33.7 33.1 33.2  RDW 15.0 14.8 15.6*  PLT 83* 88* 124*   BNPNo results for input(s): BNP, PROBNP in the last 168 hours.  DDimer No results for input(s): DDIMER in the last 168 hours.   Radiology/Studies:  CT ABDOMEN PELVIS WO CONTRAST  Result Date: 09/27/2020 CLINICAL DATA:  Abdominal pain.  Chronic renal failure EXAM: CT ABDOMEN AND PELVIS  WITHOUT CONTRAST TECHNIQUE:  Multidetector CT imaging of the abdomen and pelvis was performed following the standard protocol without IV contrast. COMPARISON:  None. FINDINGS: Lower chest: There is bibasilar atelectatic change. There is no lung base edema or consolidation. There are multiple foci of coronary artery calcification. Hepatobiliary: No focal liver lesions are evident on this noncontrast enhanced study. Gallbladder wall is not appreciably thickened. There is no biliary duct dilatation. Pancreas: No pancreatic mass or inflammatory focus. Spleen: No splenic lesions are evident. Adrenals/Urinary Tract: Adrenals bilaterally appear unremarkable. Innumerable cysts are noted throughout each kidney, several of which show peripheral calcification. There is a calcified mass arising from the periphery of the lower pole of the left kidney measuring 1.9 x 1.1 cm. Largest well-defined individual cyst is seen arising from the lateral right kidney measuring 2.6 x 2.2 cm. There is no appreciable hydronephrosis on either side. There are scattered suspected 1 mm calculi in each kidney in addition to areas of suspected calcification in the periphery of multiple cysts. There is no ureteral calculus on either side. Urinary bladder is midline with equivocal urinary bladder wall thickening. Stomach/Bowel: There is no appreciable bowel wall or mesenteric thickening. There is no appreciable bowel obstruction. The terminal ileum appears normal. There is no appreciable free air or portal venous air. Vascular/Lymphatic: There is no abdominal aortic aneurysm. There is aortic atherosclerosis. There are also foci of calcification major pelvic and mesenteric arterial vessels. There is no appreciable adenopathy in the abdomen or pelvis. Reproductive: Prostate and seminal vesicles are normal in size and contour. There are calcifications in seminal vesicles bilaterally. Other: Appendix appears normal. No evident abscess or ascites in the  abdomen or pelvis. There is slight fat in the umbilicus. Musculoskeletal: Postoperative changes noted in the proximal right femur. Several metallic pellets are noted in the proximal right femur region. Degenerative change noted in the lumbar spine with vacuum phenomenon at L5-S1 and to a lesser degree at L4-5. No blastic or lytic bone lesions are evident. No intramuscular lesions. IMPRESSION: 1. Findings indicative of adult polycystic kidney disease. Multiple calcifications are noted in the periphery of several cysts which is typical with this entity. A suspected collapsed cyst with calcification is noted in the periphery of the left kidney measuring 1.9 x 1.1 cm. 2. Scattered 1 mm calculi in each kidney. No hydronephrosis or ureteral calculus on either side. Equivocal thickening of the urinary bladder wall could indicate a degree of cystitis. Correlation with urinalysis in this regard advised. 3. No evident bowel obstruction. No abscess in the abdomen or pelvis. Appendix appears normal. 4. Aortic Atherosclerosis (ICD10-I70.0). Multiple foci of pelvic and mesenteric arterial vascular calcification noted. There are foci of coronary artery calcification. Electronically Signed   By: Lowella Grip III M.D.   On: 09/27/2020 15:25   CT HEAD WO CONTRAST  Result Date: 09/28/2020 CLINICAL DATA:  Delirium. Additional history provided by Dr. Rory Percy: Patient perseverating. EXAM: CT HEAD WITHOUT CONTRAST TECHNIQUE: Contiguous axial images were obtained from the base of the skull through the vertex without intravenous contrast. COMPARISON:  No pertinent prior exams available for comparison. FINDINGS: Brain: Mild cerebral and cerebellar atrophy. Mild patchy and ill-defined hypoattenuation within the cerebral white matter is nonspecific, but compatible with chronic small vessel ischemic disease. 10 mm infarct within the left cerebellar hemisphere, possibly acute (series 2, image 7) (series 4, image 54). There is no acute  intracranial hemorrhage. No demarcated cortical infarct. No extra-axial fluid collection. No evidence of intracranial mass. No midline shift. Vascular: No hyperdense vessel.  Atherosclerotic calcifications Skull: Normal. Negative for fracture or focal lesion. Sinuses/Orbits: Visualized orbits show no acute finding. Trace frontal, ethmoid and sphenoid sinus mucosal thickening at the imaged levels. ASPECTS ([place] Stroke Program Early CT Score) - Ganglionic level infarction (caudate, lentiform nuclei, internal capsule, insula, M1-M3 cortex): 7 - Supraganglionic infarction (M4-M6 cortex): 3 Total score (0-10 with 10 being normal): 10 These results were called by telephone at the time of interpretation on 09/28/2020 at 8:18 am to provider Dr. Rory Percy, who verbally acknowledged these results. IMPRESSION: 10 mm infarct within the medial left cerebellar hemisphere, which may be acute. Consider brain MRI for further evaluation. No acute intracranial hemorrhage or acute demarcated cortical infarct. Background mild generalized atrophy of the brain and chronic small vessel ischemic disease. Electronically Signed   By: Kellie Simmering DO   On: 09/28/2020 08:20   MR ANGIO HEAD WO CONTRAST  Result Date: 09/28/2020 CLINICAL DATA:  Neuro deficit, acute, stroke suspected. EXAM: MRI HEAD WITHOUT CONTRAST MRA HEAD WITHOUT CONTRAST TECHNIQUE: Multiplanar, multiecho pulse sequences of the brain and surrounding structures were obtained without intravenous contrast. Angiographic images of the head were obtained using MRA technique without contrast. COMPARISON:  Noncontrast head CT performed earlier today 09/28/2020. FINDINGS: MRI HEAD FINDINGS Brain: Mild cerebral and cerebellar atrophy. There are scattered small acute cortical and subcortical infarcts within the bilateral frontal, bilateral parietal, bilateral occipital and left temporal lobes. Small acute infarcts are also present within the midline callosum splenium, right caudate  nucleus and bilateral cerebellar hemispheres. The largest acute infarct is present cortical/subcortical right frontal lobe and measures 2.3 x 0.5 cm (series 5, image 21). 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 A few small scattered supratentorial chronic microhemorrhages are also present. No evidence of intracranial mass. No extra-axial fluid collection. No midline shift. Vascular: Expected proximal arterial flow voids. Skull and upper cervical spine: No focal marrow lesion. Sinuses/Orbits: Visualized orbits show no acute finding. Mild bilateral ethmoid sinus mucosal thickening. MRA HEAD FINDINGS The intracranial internal carotid arteries are patent. The M1 middle cerebral arteries are patent. No M2 proximal branch occlusion or high-grade proximal stenosis is identified. The anterior cerebral arteries are patent. The intracranial vertebral arteries are patent. The basilar artery is patent. The posterior cerebral arteries are patent. Posterior communicating arteries are present bilaterally. 2 mm infundibulum at the origin of the left posterior communicating artery. MRI head Impression #1 will be called to the ordering clinician or representative by the Radiologist Assistant, and communication documented in the PACS or [employer]. IMPRESSION: MRI head: 1. 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 Multiple vascular territories are involved and findings are highly suspicious for an embolic process. 2. 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 A few scattered supratentorial chronic microhemorrhages are also present. 3. Mild generalized atrophy of the brain. MRA head: No intracranial large vessel occlusion or proximal high-grade arterial stenosis. Electronically Signed   By: Kellie Simmering DO   On:  09/28/2020 09:41   MR ANGIO NECK WO CONTRAST  Result Date: 09/28/2020 CLINICAL DATA:  Neuro deficit, acute, stroke suspected. EXAM: MRA NECK WITHOUT CONTRAST TECHNIQUE: Angiographic images of the neck were obtained using MRA technique without intravenous contrast. Carotid stenosis measurements (when applicable) are obtained utilizing NASCET criteria, using the distal internal carotid diameter as the denominator. COMPARISON:  Non-contrast head CT performed earlier today 09/28/2020. FINDINGS: The origins of the common  carotid and vertebral arteries are excluded from the field of view. The visualized common carotid arteries and internal carotid arteries are patent within the neck without appreciable stenosis. The visualized vertebral arteries are patent within the neck without appreciable stenosis. IMPRESSION: The visualized common carotid, internal carotid and vertebral arteries patent within the neck without appreciable stenosis. Electronically Signed   By: Kellie Simmering DO   On: 09/28/2020 09:53   MR BRAIN WO CONTRAST  Result Date: 09/28/2020 CLINICAL DATA:  Neuro deficit, acute, stroke suspected. EXAM: MRI HEAD WITHOUT CONTRAST MRA HEAD WITHOUT CONTRAST TECHNIQUE: Multiplanar, multiecho pulse sequences of the brain and surrounding structures were obtained without intravenous contrast. Angiographic images of the head were obtained using MRA technique without contrast. COMPARISON:  Noncontrast head CT performed earlier today 09/28/2020. FINDINGS: MRI HEAD FINDINGS Brain: Mild cerebral and cerebellar atrophy. There are scattered small acute cortical and subcortical infarcts within the bilateral frontal, bilateral parietal, bilateral occipital and left temporal lobes. Small acute infarcts are also present within the midline callosum splenium, right caudate nucleus and bilateral cerebellar hemispheres. The largest acute infarct is present cortical/subcortical right frontal lobe and measures 2.3 x 0.5 cm (series  5, image 21). 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 A few small scattered supratentorial chronic microhemorrhages are also present. No evidence of intracranial mass. No extra-axial fluid collection. No midline shift. Vascular: Expected proximal arterial flow voids. Skull and upper cervical spine: No focal marrow lesion. Sinuses/Orbits: Visualized orbits show no acute finding. Mild bilateral ethmoid sinus mucosal thickening. MRA HEAD FINDINGS The intracranial internal carotid arteries are patent. The M1 middle cerebral arteries are patent. No M2 proximal branch occlusion or high-grade proximal stenosis is identified. The anterior cerebral arteries are patent. The intracranial vertebral arteries are patent. The basilar artery is patent. The posterior cerebral arteries are patent. Posterior communicating arteries are present bilaterally. 2 mm infundibulum at the origin of the left posterior communicating artery. MRI head Impression #1 will be called to the ordering clinician or representative by the Radiologist Assistant, and communication documented in the PACS or [employer]. IMPRESSION: MRI head: 1. 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 Multiple vascular territories are involved and findings are highly suspicious for an embolic process. 2. 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 A few scattered supratentorial chronic microhemorrhages are also present. 3. Mild generalized atrophy of the brain. MRA head: No intracranial large vessel occlusion or proximal high-grade arterial stenosis. Electronically Signed   By: Kellie Simmering DO   On: 09/28/2020 09:41   DG Chest Port 1 View  Result Date: 09/27/2020 CLINICAL DATA:  Questionable sepsis evaluate for abnormality. Presenting from dialysis with  acute complaint of body aches, chills and shortness of breath EXAM: PORTABLE CHEST 1 VIEW COMPARISON:  Shoulder radiograph September 17, 2020 and chest radiograph August 29, 2013 FINDINGS: Cardiomegaly. Low lung volumes. Streaky left basilar opacities. No significant pleural effusion or visible pneumothorax. Left vascular stent. Similar appearance of the mass like calcific density over the right shoulder. There is hazy calcification along the left shoulder. IMPRESSION: 1. Low lung volumes with streaky left basilar opacities which could represent atelectasis or infection. Electronically Signed   By: Dahlia Bailiff MD   On: 09/27/2020 14:59   EEG adult  Result Date: 09/28/2020 [name], MD     09/28/2020  4:33 PM Patient Name: Aurion Risi MRN: [id number]  Epilepsy Attending: [name] Referring Physician/Provider: Dr Amie [place] Date: 09/28/2020 Duration: 30.40 mins Patient history: 50yo M with ams. EEG to evaluate for seizure. Level of alertness:  lethargic AEDs during EEG study: GBP Technical aspects: This EEG study was done with scalp electrodes positioned according to the 10-20 International system of electrode placement. Electrical activity was acquired at a sampling rate of '500Hz'$  and reviewed with a high frequency filter of '70Hz'$  and a low frequency filter of '1Hz'$ . EEG data were recorded continuously and digitally stored. Description: EEG initially showed periodic discharges with triphasic morphology at 2-2.'5hz'$  were also noted. IV ativan '2mg'$  was administered after which EEG showed continuous  generalized 3 to 6 Hz theta-delta slowing. One episode of right arm tremor was noted at 1528 . Concomitant eeg before, during and after the event showed generalized periodic discharges with triphasic morphology at 2-2.5 without definite evolution.  Hyperventilation and photic stimulation were not performed.   ABNORMALITY - Periodic discharges with triphasic morphology, generalized -Continuous slow,  generalized IMPRESSION: This study initially showed generalized periodic discharges with triphasic morphology at 2-2.'5hz'$ . This eeg pattern can be on the ictal-interictal continuum and also seen with toxic-metabolic etiology. EEG improved after IV ativan which although not confirmatory is concerning for ictal nature of this pattern. There is also moderate diffuse encephalopathy, non specific etiology. One episode of right arm tremor was noted at 1528 during which eeg showed generalized periodic discharges with triphasic morphology at 2-2.5 without definite evolution. However, focal motor seizures may not be seen on scalp eeg. Therefore, clinical correlation is recommended. [name]   ECHOCARDIOGRAM COMPLETE BUBBLE STUDY  Result Date: 09/28/2020    ECHOCARDIOGRAM REPORT   Patient Name:   LAKENDRICK TOMASELLI Date of Exam: 09/28/2020 Medical Rec #:  [id number]      Height:       72.0 in Accession #:    [id number]     Weight:       185.0 lb Date of Birth:  12-Jun-1971      BSA:          2.061 m Patient Age:    50 years       BP:           134/92 mmHg Patient Gender: M              HR:           101 bpm. Exam Location:  Inpatient Procedure: 2D Echo, Cardiac Doppler, Color Doppler and Saline Contrast Bubble            Study Indications:    CVA  History:        Patient has prior history of Echocardiogram examinations, most                 recent 10/23/2019. CHF, Signs/Symptoms:Fever; Risk                 Factors:Hypertension. ESRD, sepsis, PNA, drug abuse.  Sonographer:    Dustin Flock Referring Phys: [id number] [employer]  1. Left ventricular ejection fraction, by estimation, is 55 to 60%. The left ventricle has normal function. The left ventricle has no regional wall motion abnormalities. There is moderate concentric left ventricular hypertrophy. Left ventricular diastolic parameters are consistent with Grade II diastolic dysfunction (pseudonormalization).  2. Right ventricular systolic function is  normal. The right ventricular size is normal. There is normal pulmonary artery systolic pressure.  3. Left atrial size was moderately dilated.  4. Right  atrial size was mild to moderately dilated.  5. The mitral valve is normal in structure. Mild to moderate mitral valve regurgitation. No evidence of mitral stenosis. Moderate to severe mitral annular calcification.  6. The aortic valve is tricuspid. Aortic valve regurgitation is trivial. No aortic stenosis is present.  7. The inferior vena cava is normal in size with greater than 50% respiratory variability, suggesting right atrial pressure of 3 mmHg.  8. Agitated saline contrast bubble study was negative, with no evidence of any interatrial shunt. Conclusion(s)/Recommendation(s): No intracardiac source of embolism detected on this transthoracic study. A transesophageal echocardiogram is recommended to exclude cardiac source of embolism if clinically indicated. FINDINGS  Left Ventricle: Left ventricular ejection fraction, by estimation, is 55 to 60%. The left ventricle has normal function. The left ventricle has no regional wall motion abnormalities. The left ventricular internal cavity size was normal in size. There is  moderate concentric left ventricular hypertrophy. Left ventricular diastolic parameters are consistent with Grade II diastolic dysfunction (pseudonormalization). Right Ventricle: The right ventricular size is normal. Right vetricular wall thickness was not well visualized. Right ventricular systolic function is normal. There is normal pulmonary artery systolic pressure. The tricuspid regurgitant velocity is 2.51 m/s, and with an assumed right atrial pressure of 3 mmHg, the estimated right ventricular systolic pressure is [id number] mmHg. Left Atrium: Left atrial size was moderately dilated. Right Atrium: Right atrial size was mild to moderately dilated. Pericardium: Trivial pericardial effusion is present. Mitral Valve: The mitral valve is normal in  structure. There is mild thickening of the mitral valve leaflet(s). There is moderate calcification of the mitral valve leaflet(s). Moderate to severe mitral annular calcification. Mild to moderate mitral valve  regurgitation. No evidence of mitral valve stenosis. Tricuspid Valve: The tricuspid valve is normal in structure. Tricuspid valve regurgitation is trivial. No evidence of tricuspid stenosis. Aortic Valve: The aortic valve is tricuspid. Aortic valve regurgitation is trivial. No aortic stenosis is present. Pulmonic Valve: The pulmonic valve was grossly normal. Pulmonic valve regurgitation is trivial. No evidence of pulmonic stenosis. Aorta: The aortic root, ascending aorta, aortic arch and descending aorta are all structurally normal, with no evidence of dilitation or obstruction. Venous: The inferior vena cava is normal in size with greater than 50% respiratory variability, suggesting right atrial pressure of 3 mmHg. IAS/Shunts: The atrial septum is grossly normal. Agitated saline contrast was given intravenously to evaluate for intracardiac shunting. Agitated saline contrast bubble study was negative, with no evidence of any interatrial shunt.  LEFT VENTRICLE PLAX 2D LVIDd:         4.80 cm  Diastology LVIDs:         3.20 cm  LV e' medial:    11.10 cm/s LV PW:         1.50 cm  LV E/e' medial:  12.2 LV IVS:        1.60 cm  LV e' lateral:   6.74 cm/s LVOT diam:     2.20 cm  LV E/e' lateral: 20.0 LV SV:         49 LV SV Index:   24 LVOT Area:     3.80 cm  RIGHT VENTRICLE RV Basal diam:  2.60 cm RV S prime:     19.00 cm/s TAPSE (M-mode): 3.6 cm LEFT ATRIUM             Index       RIGHT ATRIUM           Index LA diam:  4.50 cm 2.18 cm/m  RA Area:     20.20 cm LA Vol (A2C):   72.7 ml 35.27 ml/m RA Volume:   56.10 ml  27.22 ml/m LA Vol (A4C):   98.8 ml 47.94 ml/m LA Biplane Vol: 87.1 ml 42.26 ml/m  AORTIC VALVE LVOT Vmax:   113.00 cm/s LVOT Vmean:  71.300 cm/s LVOT VTI:    0.128 m  AORTA Ao Root diam:  3.60 cm MITRAL VALVE                TRICUSPID VALVE MV Area (PHT): 5.13 cm     TR Peak grad:   25.2 mmHg MV Decel Time: 148 msec     TR Vmax:        251.00 cm/s MV E velocity: 135.00 cm/s MV A velocity: 147.00 cm/s  SHUNTS MV E/A ratio:  0.92         Systemic VTI:  0.13 m                             Systemic Diam: 2.20 cm Buford Dresser MD Electronically signed by Buford Dresser MD Signature Date/Time: 09/28/2020/6:58:41 PM    Final      Assessment and Plan:   1.  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 He has a very eccentric jet of MR.    Is very tachycardic.   He could have a partial flail leaflet  of his MV  He ultimately needs a TEE but at this point, he is not able to cooperate adequately to do this .  It would require intubation in order to do this safely.  In addition, he is not able to give [place] informed consent .  I would prefer to see if we can stabilize him further from a neurological standpoint - perhaps with IV abx he will clear enough to perform the TEE safely  I have discussed with Dr. Florene Glen.     Risk Assessment/Risk Scores:                For questions or updates, please contact [place] Please consult [email or web address] for contact info under    Signed, Mertie Moores, MD  09/29/2020 4:28 PM

## 2020-09-29 NOTE — Evaluation (Signed)
Clinical/Bedside Swallow Evaluation Patient Details  Name: Patrick Anthony MRN: [id number] Date of Birth: 1971-05-22  Today's Date: 09/29/2020 Time: SLP Start Time (ACUTE ONLY): 1128 SLP Stop Time (ACUTE ONLY): 1155 SLP Time Calculation (min) (ACUTE ONLY): 27 min  Past Medical History:  Past Medical History:  Diagnosis Date  . Chronic kidney disease    patient on dialysis tues-thurs-sat  . Cirrhosis of liver ([place])   . Drug addiction ([place])   . ETOH abuse   . Hepatitis   . Hypertension   . Renal disorder renal failure  . Sleep apnea   . Systolic and diastolic CHF, 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[place]) 04/19/2013   Past Surgical History:  Past Surgical History:  Procedure Laterality Date  . AV FISTULA PLACEMENT Right   . AV FISTULA PLACEMENT Left 09/03/2013   Procedure: ARTERIOVENOUS (AV) FISTULA CREATION - LEFT BRACHIOCEPHALIC;  Surgeon: Conrad Bowlus, MD;  [facility];  Service: Vascular;  Laterality: Left;  . AV FISTULA PLACEMENT Left 10/30/2019   Procedure: INSERTION OF ARTERIOVENOUS (AV) GORE-TEX GRAFT ARM;  Surgeon: Rosetta Posner, MD;  Location: [place];  Service: Vascular;  Laterality: Left;  . INSERTION OF DIALYSIS CATHETER N/A 08/29/2013   Procedure: INSERTION OF DIALYSIS CATHETER;  Surgeon: Mal Misty, MD;  Location: [place];  Service: Vascular;  Laterality: N/A;  . INSERTION OF DIALYSIS CATHETER Left 10/30/2019   Procedure: INSERTION OF DIALYSIS CATHETER LEFT INTERNAL JUGULAR;  Surgeon: Rosetta Posner, MD;  Location: [place];  Service: Vascular;  Laterality: Left;  . PERIPHERAL VASCULAR CATHETERIZATION N/A 01/06/2015   Procedure: A/V Shuntogram/Fistulagram;  Surgeon: Katha Cabal, MD;  Location: [place] [facility];  Service: Cardiovascular;  Laterality: N/A;  . PERIPHERAL VASCULAR CATHETERIZATION Left 01/06/2015   Procedure: A/V Shunt Intervention;  Surgeon: Katha Cabal, MD;  Location: [place] [facility];  Service: Cardiovascular;  Laterality: Left;   HPI:  Pt is a 50 y.o male  with sepsis likely due to pneumonia. MRI 2/22 showed scattered small acute infarcts within the bilateral cerebral  hemispheres, right basal ganglia, callosal splenium and bilateral cerebellar hemispheres. CXR from 2/21 revealed low lung volumes with streaky left basilar opacities which could  represent atelectasis or infection. PMH with significant history for ESRD on dialysis, hypertension, and CHF.   Assessment / Plan / Recommendation Clinical Impression  Pt noted to have a wet vocal quality with initial intake of thin liquids and multiple swallows with purees. Pt also noted to have decreased awareness of boluses when brought to his mouth, but appears to have an appropriate autonomic swallow response once boluses were in his mouth. Given pt's current cognitive status recomend that pt remain NPO with a few ice chips after oral care and meds crushed in puree with staff to facilitate use of swallowing musculature. Pt may benefit from future instrumental testing to futher examine pt's abiliy to protect his airway while eating/drinking - SLP will continue to follow up.  SLP Visit Diagnosis: Dysphagia, unspecified (R13.10)    Aspiration Risk  Moderate aspiration risk    Diet Recommendation NPO   Medication Administration: Crushed with puree    Other  Recommendations Oral Care Recommendations: Oral care QID   Follow up Recommendations Inpatient Rehab;24 hour supervision/assistance      Frequency and Duration min 2x/week  2 weeks       Prognosis Prognosis for Safe Diet Advancement: Fair Barriers to Reach Goals: Cognitive deficits      Swallow Study   General HPI: Pt  is a 50 y.o male with sepsis likely due to pneumonia. MRI 2/22 showed scattered small acute infarcts within the bilateral cerebral  hemispheres, right basal ganglia, callosal splenium and bilateral cerebellar hemispheres. CXR from 2/21 revealed low lung volumes with streaky left basilar opacities which could  represent  atelectasis or infection. PMH with significant history for ESRD on dialysis, hypertension, and CHF. Type of Study: Bedside Swallow Evaluation Previous Swallow Assessment: None in chart Diet Prior to this Study: NPO Temperature Spikes Noted: No Respiratory Status: Nasal cannula History of Recent Intubation: No Behavior/Cognition: Alert;Requires cueing Oral Cavity Assessment: Within Functional Limits Oral Care Completed by SLP: No Oral Cavity - Dentition: Missing dentition Vision: Functional for self-feeding Self-Feeding Abilities: Total assist Patient Positioning: Upright in bed Baseline Vocal Quality: Normal Volitional Cough: Cognitively unable to elicit Volitional Swallow: Unable to elicit    Oral/Motor/Sensory Function Overall Oral Motor/Sensory Function: Other (comment) (Lingual symmetry and lip seal appears WFL, others unable to visualize due to difficulty following commands)   Ice Chips Ice chips: Within functional limits Presentation: Spoon   Thin Liquid Thin Liquid: Impaired Presentation: Straw;Spoon Pharyngeal  Phase Impairments: Wet Vocal Quality    Nectar Thick Nectar Thick Liquid: Not tested   Honey Thick Honey Thick Liquid: Not tested   Puree Puree: Impaired Presentation: Spoon Pharyngeal Phase Impairments: Multiple swallows   Solid     Solid: Not tested      Jeanine Luz., SLP Student 09/29/2020,12:50 PM

## 2020-09-29 NOTE — Evaluation (Signed)
Occupational Therapy Evaluation Patient Details Name: Patrick Anthony MRN: [id number] DOB: [birth year] Today's Date: 09/29/2020    History of Present Illness 50 y.o. male admitted with sepsis 2/2 pneumonia. MRI (+) multiple scattered small infarcts bilaterally. Seizure-like activity noted 2/22 with EEG (+) moderate diffuse encephalopathy of unknown etiology. No definite seizures noted during study.   Clinical Impression   Patient presents 2/2 diagnosis above and problem list below. Patient states the word "yes" once throughout entire session when asked if he could state his name. Patient also yells profanity with painful stimuli at nail bed on L hand. Grimacing and moaning noted with PROM and LUE but patient does not attempt to verbalize pain. Patient unable to provide PLOF or home set-up. No family present at bedside. Patient currently presents with deficits in strength, mobility, and cognition. Patient demonstrates movement at all limbs but does not move in response to commands to do so. Session limited 2/2 elevated BP with reading of 160/121 in supine. Hospital transport arrived during session to take patient to HD. Patient would benefit from continued acute OT services to maximize safety and independence with self-care tasks. Recommendation for SNF rehab pending patient progress. If patient begins following 1-step verbal commands he may progress beyond needing SNF rehab. OT will continue to follow acutely.     Follow Up Recommendations  SNF;Supervision/Assistance - 24 hour    Equipment Recommendations  Other (comment) (TBD)    Recommendations for Other Services       Precautions / Restrictions Precautions Precautions: Fall Restrictions Weight Bearing Restrictions: No      Mobility Bed Mobility Overal bed mobility: Needs Assistance             General bed mobility comments: Deferred 2/2 transport present to take patient to dialysis.    Transfers Overall transfer level: Needs  assistance               General transfer comment: Deferred 2/2 elevated BP.    Balance                                           ADL either performed or assessed with clinical judgement   ADL Overall ADL's : Needs assistance/impaired Eating/Feeding: Total assistance   Grooming: Total assistance Grooming Details (indicate cue type and reason): Total A to wash face. Patient does not attempt to assist. Unable to follow cues to grasp washcloth and bring to face.             Lower Body Dressing: Total assistance;Bed level Lower Body Dressing Details (indicate cue type and reason): Total A to don footwear. Patient does not attempt to assist.               General ADL Comments: Did not attempt OOB activity 2/2 elevated BP 160/121 in supine.     Vision Patient Visual Report: Other (comment) (Patient unable to state.) Additional Comments: Unable to formally assess 2/2 decreased cog. Patient does fixate on this therapist and items presented in central vision. Will continue to assess.     Perception     Praxis      Pertinent Vitals/Pain Pain Assessment: Faces Faces Pain Scale: Hurts even more Pain Location: Generalized with movement specifically with passive movement of LUE. Pain Descriptors / Indicators: Discomfort;Grimacing;Guarding;Moaning Pain Intervention(s): Limited activity within patient's tolerance;Monitored during session;Repositioned     Hand Dominance  Extremity/Trunk Assessment Upper Extremity Assessment Upper Extremity Assessment: Difficult to assess due to impaired cognition (Patient with grimacing and moaning during PROM of LUE. Swelling noted in RUE. BUE repositioned on pillows.)   Lower Extremity Assessment Lower Extremity Assessment: Defer to PT evaluation   Cervical / Trunk Assessment Cervical / Trunk Assessment: Normal   Communication     Cognition Arousal/Alertness: Awake/alert Behavior During Therapy: Flat  affect Overall Cognitive Status: Impaired/Different from baseline Area of Impairment: Orientation;Attention;Memory;Following commands;Awareness;Problem solving                 Orientation Level: Disoriented to;Person;Place;Time;Situation Current Attention Level: Focused   Following Commands:  (Patient does not follow 1-step verbal commnads. Patient does open his eyes when name is called.)   Awareness: Intellectual Problem Solving: Slow processing;Decreased initiation;Difficulty sequencing;Requires verbal cues;Requires tactile cues General Comments: Patient is unable to speak. Does not follow commands to squeeze this therapists hand, give thumbs up or grasp washcloth presented in central vision. Patient does fixate on items in visual field and opens eyes when name is called.   General Comments  Patient on 1L O2 via  upon entry. SpO2 96%. O2 removed with SpO2 remaining >95%. RN notified.    Exercises     Shoulder Instructions      Home Living Family/patient expects to be discharged to:: Private residence Living Arrangements: Alone Available Help at Discharge: Other (Comment) (Unknown)                             Additional Comments: Patient unable to provide 2/2 cog deficits. Will continue to assess.      Prior Functioning/Environment Level of Independence: Independent                 OT Problem List: Decreased strength;Decreased range of motion;Decreased activity tolerance;Impaired balance (sitting and/or standing);Decreased coordination;Decreased cognition;Decreased safety awareness;Decreased knowledge of precautions;Impaired UE functional use;Pain;Increased edema      OT Treatment/Interventions: Self-care/ADL training;Therapeutic exercise;Energy conservation;DME and/or AE instruction;Therapeutic activities;Cognitive remediation/compensation;Patient/family education;Balance training    OT Goals(Current goals can be found in the care plan section) Acute  Rehab OT Goals Patient Stated Goal: Unable to state. OT Goal Formulation: Patient unable to participate in goal setting Time For Goal Achievement: 10/13/20 Potential to Achieve Goals: Good ADL Goals Pt Will Perform Eating: Independently;sitting Pt Will Perform Grooming: with supervision;standing Pt Will Perform Upper Body Dressing: with supervision;sitting Pt Will Perform Lower Body Dressing: with supervision;sit to/from stand Pt Will Transfer to Toilet: with supervision;ambulating Pt Will Perform Toileting - Clothing Manipulation and hygiene: with supervision;sit to/from stand Additional ADL Goal #1: Patient will attend to 1 seated grooming task for >3 min with no more than Min cues demonstrating increased attention. Additional ADL Goal #2: Patient will follow 1-step verbal commands with 50% accuracy in prep for ADLs.  OT Frequency: Min 2X/week   Barriers to D/C:            Co-evaluation              AM-PAC OT "6 Clicks" Daily Activity     Outcome Measure Help from another person eating meals?: Total Help from another person taking care of personal grooming?: Total Help from another person toileting, which includes using toliet, bedpan, or urinal?: Total Help from another person bathing (including washing, rinsing, drying)?: Total Help from another person to put on and taking off regular upper body clothing?: Total Help from another person to put on and taking  off regular lower body clothing?: Total 6 Click Score: 6   End of Session Nurse Communication: Other (comment) (Patient able to maintain O2 sats on RA.)  Activity Tolerance:   Patient left:    OT Visit Diagnosis: Unsteadiness on feet (R26.81);Other abnormalities of gait and mobility (R26.89);Muscle weakness (generalized) (M62.81);Feeding difficulties (R63.3);Adult, failure to thrive (R62.7)                Time: [id number] OT Time Calculation (min): 12 min Charges:  OT General Charges $OT Visit: 1 Visit OT  Evaluation $OT Eval Moderate Complexity: 1 Mod  Noemi Bellissimo H. OTR/L Supplemental OT, Department of rehab services [phone]  Alijah Hyde R H. 09/29/2020, 1:33 PM

## 2020-09-29 NOTE — Procedures (Addendum)
Patient Name: Tavares Kiyabu  MRN: [id number]  Epilepsy Attending: [name]  Referring Physician/Provider: Dr Zeb Comfort Duration: 09/28/2020 1552 to 09/29/2020 [id number]  Patient history: 50yo M with ams. EEG to evaluate for seizure.   Level of alertness:  lethargic  AEDs during EEG study: GBP  Technical aspects: This EEG study was done with scalp electrodes positioned according to the 10-20 International system of electrode placement. Electrical activity was acquired at a sampling rate of '500Hz'$  and reviewed with a high frequency filter of '70Hz'$  and a low frequency filter of '1Hz'$ . EEG data were recorded continuously and digitally stored.   Description: EEG showed continuous  generalized 3 to 6 Hz theta-delta slowing. Multiple episodes of right arm tremor were noted. Concomitant eeg before, during and after the event did not show any EEG change to suggest seizure. Hyperventilation and photic stimulation were not performed.     ABNORMALITY - Continuous slow, generalized  IMPRESSION: This study  is suggestive of moderate diffuse encephalopathy, non specific etiology.  Multiple episodes of right arm tremor were noted without concomitant EEG change and are most likely not epileptic.  No definite seizures or epileptiform discharges were seen during the study.  Azizah Lisle Barbra Sarks

## 2020-09-29 NOTE — Evaluation (Signed)
Speech Language Pathology Evaluation Patient Details Name: Patrick Anthony MRN: [id number] DOB: 03/28/1971 Today's Date: 09/29/2020 Time: [id number] SLP Time Calculation (min) (ACUTE ONLY): 27 min  Problem List:  Patient Active Problem List   Diagnosis Date Noted  . Cerebral embolism with cerebral infarction 09/29/2020  . Sepsis ([place]) 09/27/2020  . Pneumonia due to infectious agent 09/27/2020  . 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 Sepsis due to pneumonia ([place]) 09/27/2020  . Lactic acidosis 09/27/2020  . Chest pain 05/03/2017  . End stage renal disease ([place]) 10/17/2013  . Anemia in chronic kidney disease 08/30/2013  . Hepatitis C 08/30/2013  . ESRD on dialysis ([place]) 08/30/2013  . Hypertensive crisis 08/28/2013  . Hypertensive emergency 04/20/2013  . Chronic diastolic CHF (congestive heart failure) ([place]) 04/19/2013  . HTN (hypertension) 04/19/2013   Past Medical History:  Past Medical History:  Diagnosis Date  . Chronic kidney disease    patient on dialysis tues-thurs-sat  . Cirrhosis of liver ([place])   . Drug addiction ([place])   . ETOH abuse   . Hepatitis   . Hypertension   . Renal disorder renal failure  . Sleep apnea   . Systolic and diastolic CHF, 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[place]) 04/19/2013   Past Surgical History:  Past Surgical History:  Procedure Laterality Date  . AV FISTULA PLACEMENT Right   . AV FISTULA PLACEMENT Left 09/03/2013   Procedure: ARTERIOVENOUS (AV) FISTULA CREATION - LEFT BRACHIOCEPHALIC;  Surgeon: Conrad , MD;  Location: [facility] OR;  Service: Vascular;  Laterality: Left;  . AV FISTULA PLACEMENT Left 10/30/2019   Procedure: INSERTION OF ARTERIOVENOUS (AV) GORE-TEX GRAFT ARM;  Surgeon: Rosetta Posner, MD;  Location: [place];  Service: Vascular;  Laterality: Left;  . INSERTION OF DIALYSIS CATHETER N/A 08/29/2013   Procedure: INSERTION OF DIALYSIS CATHETER;  Surgeon: Mal Misty, MD;  Location: [place];  Service: Vascular;  Laterality: N/A;   . INSERTION OF DIALYSIS CATHETER Left 10/30/2019   Procedure: INSERTION OF DIALYSIS CATHETER LEFT INTERNAL JUGULAR;  Surgeon: Rosetta Posner, MD;  Location: [place];  Service: Vascular;  Laterality: Left;  . PERIPHERAL VASCULAR CATHETERIZATION N/A 01/06/2015   Procedure: A/V Shuntogram/Fistulagram;  Surgeon: Katha Cabal, MD;  Location: [place] [facility];  Service: Cardiovascular;  Laterality: N/A;  . PERIPHERAL VASCULAR CATHETERIZATION Left 01/06/2015   Procedure: A/V Shunt Intervention;  Surgeon: Katha Cabal, MD;  Location: [place] [facility];  Service: Cardiovascular;  Laterality: Left;   HPI:  Pt is a 50 y.o male with sepsis likely due to pneumonia. MRI 2/22 showed scattered small acute infarcts within the bilateral cerebral  hemispheres, right basal ganglia, callosal splenium and bilateral cerebellar hemispheres. CXR from 2/21 revealed low lung volumes with streaky left basilar opacities which could  represent atelectasis or infection. PMH with significant history for ESRD on dialysis, hypertension, and CHF.   Assessment / Plan / Recommendation Clinical Impression  Pt presents with severe receptive and expressive language deficits. Pt was vocalizing during today's assessment but was not verbalizing with the exception of a spontaneous "okay." Other verbalizations that were noted include "bye" and a potential "hi" with repetition and visual cues; however, repetition at the word level remains impaired for this pt. Other areas that appear impaired include sustained attention and following one-step commands. Pt benefited from multimodal cues for these areas of difficulty such as a dry tsp to open his mouth when following commands. Pt did appear to become more engaged with the progression  of today's assessment and he may be at least partially aware of his deficits given that he became more visibly upset towards the end of the assessment. Given these findings, recomend cognitive-communicative tx  with follow up from SLP.    SLP Assessment  SLP Recommendation/Assessment: Patient needs continued Speech Lanaguage Pathology Services SLP Visit Diagnosis: Cognitive communication deficit [id number])    Follow Up Recommendations  Inpatient Rehab;24 hour supervision/assistance    Frequency and Duration min 2x/week  2 weeks      SLP Evaluation Cognition  Overall Cognitive Status: Impaired/Different from baseline Arousal/Alertness: Awake/alert Attention: Sustained;Focused Focused Attention: Appears intact Sustained Attention: Impaired Sustained Attention Impairment: Verbal basic Safety/Judgment: Impaired       Comprehension  Auditory Comprehension Overall Auditory Comprehension: Impaired Yes/No Questions: Impaired Basic Biographical Questions: 0-25% accurate Basic Immediate Environment Questions: 0-24% accurate Commands: Impaired One Step Basic Commands: 0-24% accurate Interfering Components: Motor planning;Attention EffectiveTechniques: Extra processing time;Increased volume;Repetition;Slowed speech;Stressing words;Visual/Gestural cues    Expression Expression Primary Mode of Expression: Verbal Verbal Expression Overall Verbal Expression: Impaired Initiation: Impaired Automatic Speech: Social Response Level of Generative/Spontaneous Verbalization: Word Repetition: Impaired Level of Impairment: Word level Pragmatics: Impairment Impairments: Eye contact Interfering Components: Attention Non-Verbal Means of Communication: Not applicable   Oral / Motor  Oral Motor/Sensory Function Overall Oral Motor/Sensory Function: Other (comment) (Lingual symmetry and lip seal appears WFL, others unable to visualize due to difficulty following commands) Motor Speech Overall Motor Speech: Other (comment) (Needs futher evaluation given severity of current verbal deficit)   GO                    Jeanine Luz., SLP Student 09/29/2020, 1:54 PM

## 2020-09-29 NOTE — Progress Notes (Signed)
[facility] for Infectious Disease    Date of Admission:  09/27/2020   Total days of antibiotics 3           ID: Patrick Anthony is a 50 y.o. male with  MRSA bacteremia and stroke Principal Problem:   Pneumonia due to infectious agent Active Problems:   Chronic diastolic CHF (congestive heart failure) (HCC)   HTN (hypertension)   Anemia in chronic kidney disease   ESRD on dialysis ([place])   Sepsis (HCC)   ADPKD (autosomal dominant polycystic kidney disease)   Sepsis due to pneumonia (HCC)   Lactic acidosis   Cerebral embolism with cerebral infarction   Bacteremia due to Staphylococcus aureus    Subjective: Still encephalopathic, not responding to questions appropriately. Afebrile  WBC trending up to 40  Medications:  . calcium acetate  667 mg Oral TID WC  . calcium acetate  667 mg Oral With snacks  . Chlorhexidine Gluconate Cloth  6 each Topical Q0600  . cinacalcet  30 mg Oral Q breakfast  . cloNIDine  0.3 mg Oral TID  . metoprolol tartrate  2.5 mg Intravenous Q6H    Objective: Vital signs in last 24 hours: Temp:  [97.5 F (36.4 C)-100.6 F (38.1 C)] 98.1 F (36.7 C) (02/23 1240) Pulse Rate:  [104-127] 120 (02/23 1530) Resp:  [16-37] 25 (02/23 1530) BP: (140-180)/(82-120) 176/98 (02/23 1530) SpO2:  [97 %-100 %] 98 % (02/23 1240) Weight:  [84.3 kg] 84.3 kg (02/23 1240) Physical Exam  Constitutional: He is disoriented, continues to repeat "yeah". He appears well-developed and well-nourished. No distress.  HENT:  Mouth/Throat: Oropharynx is dry, does not follow command to open mouth Cardiovascular: Normal rate, regular rhythm and normal heart sounds. Exam reveals no gallop and no friction rub.  No murmur heard.  Pulmonary/Chest: Effort normal and breath sounds normal. No respiratory distress. He has no wheezes.  Abdominal: Soft. Bowel sounds are normal. He exhibits no distension. There is no tenderness. .  Neurological: He is alert but has expressive aphasia.  myclonus of right arm Skin: Skin is warm and dry. No rash noted. No erythema.     Lab Results Recent Labs    09/28/20 0436 09/29/20 1301  WBC 29.6* 41.8*  HGB 9.3* 9.9*  HCT 28.1* 29.8*  NA 135 138  K 5.0 5.4*  CL 96* 96*  CO2 22 17*  BUN 76* 123*  CREATININE 11.10* 13.25*   Liver Panel Recent Labs    09/27/20 1416 09/29/20 1301  PROT 8.4*  --   ALBUMIN 2.9* 2.3*  AST 42*  --   ALT 15  --   ALKPHOS 107  --   BILITOT 2.1*  --     Microbiology: 2/21 blood cx - MRSA bacteremia Studies/Results: CT HEAD WO CONTRAST  Result Date: 09/28/2020 CLINICAL DATA:  Delirium. Additional history provided by Dr. Rory Percy: Patient perseverating. EXAM: CT HEAD WITHOUT CONTRAST TECHNIQUE: Contiguous axial images were obtained from the base of the skull through the vertex without intravenous contrast. COMPARISON:  No pertinent prior exams available for comparison. FINDINGS: Brain: Mild cerebral and cerebellar atrophy. Mild patchy and ill-defined hypoattenuation within the cerebral white matter is nonspecific, but compatible with chronic small vessel ischemic disease. 10 mm infarct within the left cerebellar hemisphere, possibly acute (series 2, image 7) (series 4, image 54). There is no acute intracranial hemorrhage. No demarcated cortical infarct. No extra-axial fluid collection. No evidence of intracranial mass. No midline shift. Vascular: No hyperdense vessel.  Atherosclerotic calcifications  Skull: Normal. Negative for fracture or focal lesion. Sinuses/Orbits: Visualized orbits show no acute finding. Trace frontal, ethmoid and sphenoid sinus mucosal thickening at the imaged levels. ASPECTS ([place] Stroke Program Early CT Score) - Ganglionic level infarction (caudate, lentiform nuclei, internal capsule, insula, M1-M3 cortex): 7 - Supraganglionic infarction (M4-M6 cortex): 3 Total score (0-10 with 10 being normal): 10 These results were called by telephone at the time of interpretation on 09/28/2020 at  8:18 am to provider Dr. Rory Percy, who verbally acknowledged these results. IMPRESSION: 10 mm infarct within the medial left cerebellar hemisphere, which may be acute. Consider brain MRI for further evaluation. No acute intracranial hemorrhage or acute demarcated cortical infarct. Background mild generalized atrophy of the brain and chronic small vessel ischemic disease. Electronically Signed   By: Kellie Simmering DO   On: 09/28/2020 08:20   MR ANGIO HEAD WO CONTRAST  Result Date: 09/28/2020 CLINICAL DATA:  Neuro deficit, acute, stroke suspected. EXAM: MRI HEAD WITHOUT CONTRAST MRA HEAD WITHOUT CONTRAST TECHNIQUE: Multiplanar, multiecho pulse sequences of the brain and surrounding structures were obtained without intravenous contrast. Angiographic images of the head were obtained using MRA technique without contrast. COMPARISON:  Noncontrast head CT performed earlier today 09/28/2020. FINDINGS: MRI HEAD FINDINGS Brain: Mild cerebral and cerebellar atrophy. There are scattered small acute cortical and subcortical infarcts within the bilateral frontal, bilateral parietal, bilateral occipital and left temporal lobes. Small acute infarcts are also present within the midline callosum splenium, right caudate nucleus and bilateral cerebellar hemispheres. The largest acute infarct is present cortical/subcortical right frontal lobe and measures 2.3 x 0.5 cm (series 5, image 21). 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 A few small scattered supratentorial chronic microhemorrhages are also present. No evidence of intracranial mass. No extra-axial fluid collection. No midline shift. Vascular: Expected proximal arterial flow voids. Skull and upper cervical spine: No focal marrow lesion. Sinuses/Orbits: Visualized orbits show no acute finding. Mild bilateral ethmoid sinus mucosal thickening. MRA HEAD FINDINGS The intracranial internal carotid arteries are patent. The M1 middle  cerebral arteries are patent. No M2 proximal branch occlusion or high-grade proximal stenosis is identified. The anterior cerebral arteries are patent. The intracranial vertebral arteries are patent. The basilar artery is patent. The posterior cerebral arteries are patent. Posterior communicating arteries are present bilaterally. 2 mm infundibulum at the origin of the left posterior communicating artery. MRI head Impression #1 will be called to the ordering clinician or representative by the Radiologist Assistant, and communication documented in the PACS or [employer]. IMPRESSION: MRI head: 1. 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 Multiple vascular territories are involved and findings are highly suspicious for an embolic process. 2. 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 A few scattered supratentorial chronic microhemorrhages are also present. 3. Mild generalized atrophy of the brain. MRA head: No intracranial large vessel occlusion or proximal high-grade arterial stenosis. Electronically Signed   By: Kellie Simmering DO   On: 09/28/2020 09:41   MR ANGIO NECK WO CONTRAST  Result Date: 09/28/2020 CLINICAL DATA:  Neuro deficit, acute, stroke suspected. EXAM: MRA NECK WITHOUT CONTRAST TECHNIQUE: Angiographic images of the neck were obtained using MRA technique without intravenous contrast. Carotid stenosis measurements (when applicable) are obtained utilizing NASCET criteria, using the distal internal carotid diameter as the denominator. COMPARISON:  Non-contrast head CT performed earlier today 09/28/2020. FINDINGS: The origins of the common carotid and  vertebral arteries are excluded from the field of view. The visualized common carotid arteries and internal carotid arteries are patent within the neck without appreciable stenosis. The visualized  vertebral arteries are patent within the neck without appreciable stenosis. IMPRESSION: The visualized common carotid, internal carotid and vertebral arteries patent within the neck without appreciable stenosis. Electronically Signed   By: Kellie Simmering DO   On: 09/28/2020 09:53   MR BRAIN WO CONTRAST  Result Date: 09/28/2020 CLINICAL DATA:  Neuro deficit, acute, stroke suspected. EXAM: MRI HEAD WITHOUT CONTRAST MRA HEAD WITHOUT CONTRAST TECHNIQUE: Multiplanar, multiecho pulse sequences of the brain and surrounding structures were obtained without intravenous contrast. Angiographic images of the head were obtained using MRA technique without contrast. COMPARISON:  Noncontrast head CT performed earlier today 09/28/2020. FINDINGS: MRI HEAD FINDINGS Brain: Mild cerebral and cerebellar atrophy. There are scattered small acute cortical and subcortical infarcts within the bilateral frontal, bilateral parietal, bilateral occipital and left temporal lobes. Small acute infarcts are also present within the midline callosum splenium, right caudate nucleus and bilateral cerebellar hemispheres. The largest acute infarct is present cortical/subcortical right frontal lobe and measures 2.3 x 0.5 cm (series 5, image 21). 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 A few small scattered supratentorial chronic microhemorrhages are also present. No evidence of intracranial mass. No extra-axial fluid collection. No midline shift. Vascular: Expected proximal arterial flow voids. Skull and upper cervical spine: No focal marrow lesion. Sinuses/Orbits: Visualized orbits show no acute finding. Mild bilateral ethmoid sinus mucosal thickening. MRA HEAD FINDINGS The intracranial internal carotid arteries are patent. The M1 middle cerebral arteries are patent. No M2 proximal branch occlusion or high-grade proximal stenosis is identified. The anterior cerebral arteries are patent. The  intracranial vertebral arteries are patent. The basilar artery is patent. The posterior cerebral arteries are patent. Posterior communicating arteries are present bilaterally. 2 mm infundibulum at the origin of the left posterior communicating artery. MRI head Impression #1 will be called to the ordering clinician or representative by the Radiologist Assistant, and communication documented in the PACS or [employer]. IMPRESSION: MRI head: 1. 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 Multiple vascular territories are involved and findings are highly suspicious for an embolic process. 2. 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 A few scattered supratentorial chronic microhemorrhages are also present. 3. Mild generalized atrophy of the brain. MRA head: No intracranial large vessel occlusion or proximal high-grade arterial stenosis. Electronically Signed   By: Kellie Simmering DO   On: 09/28/2020 09:41   EEG adult  Result Date: 09/28/2020 [name], MD     09/28/2020  4:33 PM Patient Name: Ashkon Romanowski MRN: [id number] Epilepsy Attending: [name] Referring Physician/Provider: Dr Amie [place] Date: 09/28/2020 Duration: 30.40 mins Patient history: 50yo M with ams. EEG to evaluate for seizure. Level of alertness:  lethargic AEDs during EEG study: GBP Technical aspects: This EEG study was done with scalp electrodes positioned according to the 10-20 International system of electrode placement. Electrical activity was acquired at a sampling rate of '500Hz'$  and reviewed with a high frequency filter of '70Hz'$  and a low frequency filter of '1Hz'$ . EEG data were recorded continuously and digitally stored. Description: EEG initially showed periodic discharges with triphasic morphology at 2-2.'5hz'$  were also noted. IV ativan '2mg'$  was administered after which EEG  showed continuous  generalized 3 to 6 Hz theta-delta  slowing. One episode of right arm tremor was noted at 1528 . Concomitant eeg before, during and after the event showed generalized periodic discharges with triphasic morphology at 2-2.5 without definite evolution.  Hyperventilation and photic stimulation were not performed.   ABNORMALITY - Periodic discharges with triphasic morphology, generalized -Continuous slow, generalized IMPRESSION: This study initially showed generalized periodic discharges with triphasic morphology at 2-2.'5hz'$ . This eeg pattern can be on the ictal-interictal continuum and also seen with toxic-metabolic etiology. EEG improved after IV ativan which although not confirmatory is concerning for ictal nature of this pattern. There is also moderate diffuse encephalopathy, non specific etiology. One episode of right arm tremor was noted at 1528 during which eeg showed generalized periodic discharges with triphasic morphology at 2-2.5 without definite evolution. However, focal motor seizures may not be seen on scalp eeg. Therefore, clinical correlation is recommended. [name]   ECHOCARDIOGRAM COMPLETE BUBBLE STUDY  Result Date: 09/28/2020    ECHOCARDIOGRAM REPORT   Patient Name:   DUWAYNE BOHLAND Date of Exam: 09/28/2020 Medical Rec #:  [id number]      Height:       72.0 in Accession #:    [id number]     Weight:       185.0 lb Date of Birth:  04-Feb-1971      BSA:          2.061 m Patient Age:    89 years       BP:           134/92 mmHg Patient Gender: M              HR:           101 bpm. Exam Location:  Inpatient Procedure: 2D Echo, Cardiac Doppler, Color Doppler and Saline Contrast Bubble            Study Indications:    CVA  History:        Patient has prior history of Echocardiogram examinations, most                 recent 10/23/2019. CHF, Signs/Symptoms:Fever; Risk                 Factors:Hypertension. ESRD, sepsis, PNA, drug abuse.  Sonographer:    Dustin Flock Referring Phys:  [id number] [place]  1. Left ventricular ejection fraction, by estimation, is 55 to 60%. The left ventricle has normal function. The left ventricle has no regional wall motion abnormalities. There is moderate concentric left ventricular hypertrophy. Left ventricular diastolic parameters are consistent with Grade II diastolic dysfunction (pseudonormalization).  2. Right ventricular systolic function is normal. The right ventricular size is normal. There is normal pulmonary artery systolic pressure.  3. Left atrial size was moderately dilated.  4. Right atrial size was mild to moderately dilated.  5. The mitral valve is normal in structure. Mild to moderate mitral valve regurgitation. No evidence of mitral stenosis. Moderate to severe mitral annular calcification.  6. The aortic valve is tricuspid. Aortic valve regurgitation is trivial. No aortic stenosis is present.  7. The inferior vena cava is normal in size with greater than 50% respiratory variability, suggesting right atrial pressure of 3 mmHg.  8. Agitated saline contrast bubble study was negative, with no evidence of any interatrial shunt. Conclusion(s)/Recommendation(s): No intracardiac source of embolism detected on this transthoracic study. A transesophageal echocardiogram is recommended to exclude cardiac source of embolism if clinically indicated. FINDINGS  Left Ventricle: Left ventricular ejection fraction,  by estimation, is 55 to 60%. The left ventricle has normal function. The left ventricle has no regional wall motion abnormalities. The left ventricular internal cavity size was normal in size. There is  moderate concentric left ventricular hypertrophy. Left ventricular diastolic parameters are consistent with Grade II diastolic dysfunction (pseudonormalization). Right Ventricle: The right ventricular size is normal. Right vetricular wall thickness was not well visualized. Right ventricular systolic function is normal. There is normal  pulmonary artery systolic pressure. The tricuspid regurgitant velocity is 2.51 m/s, and with an assumed right atrial pressure of 3 mmHg, the estimated right ventricular systolic pressure is [id number] mmHg. Left Atrium: Left atrial size was moderately dilated. Right Atrium: Right atrial size was mild to moderately dilated. Pericardium: Trivial pericardial effusion is present. Mitral Valve: The mitral valve is normal in structure. There is mild thickening of the mitral valve leaflet(s). There is moderate calcification of the mitral valve leaflet(s). Moderate to severe mitral annular calcification. Mild to moderate mitral valve  regurgitation. No evidence of mitral valve stenosis. Tricuspid Valve: The tricuspid valve is normal in structure. Tricuspid valve regurgitation is trivial. No evidence of tricuspid stenosis. Aortic Valve: The aortic valve is tricuspid. Aortic valve regurgitation is trivial. No aortic stenosis is present. Pulmonic Valve: The pulmonic valve was grossly normal. Pulmonic valve regurgitation is trivial. No evidence of pulmonic stenosis. Aorta: The aortic root, ascending aorta, aortic arch and descending aorta are all structurally normal, with no evidence of dilitation or obstruction. Venous: The inferior vena cava is normal in size with greater than 50% respiratory variability, suggesting right atrial pressure of 3 mmHg. IAS/Shunts: The atrial septum is grossly normal. Agitated saline contrast was given intravenously to evaluate for intracardiac shunting. Agitated saline contrast bubble study was negative, with no evidence of any interatrial shunt.  LEFT VENTRICLE PLAX 2D LVIDd:         4.80 cm  Diastology LVIDs:         3.20 cm  LV e' medial:    11.10 cm/s LV PW:         1.50 cm  LV E/e' medial:  12.2 LV IVS:        1.60 cm  LV e' lateral:   6.74 cm/s LVOT diam:     2.20 cm  LV E/e' lateral: 20.0 LV SV:         49 LV SV Index:   24 LVOT Area:     3.80 cm  RIGHT VENTRICLE RV Basal diam:  2.60 cm RV S  prime:     19.00 cm/s TAPSE (M-mode): 3.6 cm LEFT ATRIUM             Index       RIGHT ATRIUM           Index LA diam:        4.50 cm 2.18 cm/m  RA Area:     20.20 cm LA Vol (A2C):   72.7 ml 35.27 ml/m RA Volume:   56.10 ml  27.22 ml/m LA Vol (A4C):   98.8 ml 47.94 ml/m LA Biplane Vol: 87.1 ml 42.26 ml/m  AORTIC VALVE LVOT Vmax:   113.00 cm/s LVOT Vmean:  71.300 cm/s LVOT VTI:    0.128 m  AORTA Ao Root diam: 3.60 cm MITRAL VALVE                TRICUSPID VALVE MV Area (PHT): 5.13 cm     TR Peak grad:   25.2 mmHg MV Decel Time: 148  msec     TR Vmax:        251.00 cm/s MV E velocity: 135.00 cm/s MV A velocity: 147.00 cm/s  SHUNTS MV E/A ratio:  0.92         Systemic VTI:  0.13 m                             Systemic Diam: 2.20 cm Buford Dresser MD Electronically signed by Buford Dresser MD Signature Date/Time: 09/28/2020/6:58:41 PM    Final      Assessment/Plan: MRSA bacteremia in the setting of embolic stroke, highly suspicious for endocarditis = continue with vancomycin. Recommend TEE as well as thoracic and lumbar spine imaging to rule out other sources of infection.  Embolic stroke = defer to neurology team for further management.  [facility] Cell: [phone] Pager: [phone]  09/29/2020, 5:12 PM

## 2020-09-29 NOTE — Progress Notes (Signed)
OT Cancellation Note  Patient Details Name: Jules Seliga MRN: [id number] DOB: [birth year]   Cancelled Treatment:    Reason Eval/Treat Not Completed: Other (comment) patient with another discipline at this time. OT to check back as time allows.   Gloris Manchester OTR/L Supplemental OT, Department of rehab services [phone]  Oliver Heitzenrater R H. 09/29/2020, 11:33 AM

## 2020-09-29 NOTE — Evaluation (Signed)
Physical Therapy Evaluation Patient Details Name: Patrick Anthony MRN: [id number] DOB: 03/12/1971 Today's Date: 09/29/2020   History of Present Illness  Pt is a 50 y.o. male presenting with generalized weakness that began ~10 days PTA along with tachycardia, fever/chills, chest pain with SOB, and mild nausea with abdominal pain. Pt found to have sepsis likely due to pneumonia due to infectious agent affecting L lower lobe. During admission, Code Stroke was called 2/22 after pt found to have AMS. Outside window for tPA. MRI revealed scattered small acute infarcts within bil cerebral hemispheres, R basal ganglia, callosal splenium and bil cerebellar hemispheres, suspecting embolic process. Pt with jerking of arms also, Stat EEG showed generalized periodic discharges with triphasic morphology-pattern that can be on active interictal continuum and also can be seen with toxic metabolic encephalopathy. PMHx significant for ESRD on dialysis MWF, HTN, OSA, chronic diastolic congestive heart failure, liver cirrhosis due to hepatitis C, and hx of drug and EtOH abuse.  Clinical Impression  Pt presents with condition above and deficits mentioned below, see PT Problem List. Patient unable to provide PLOF or home set-up as he is non-verbal, minimally opening eyes today and only responding to his name or cues to open his eyes. No family present at bedside or able to be reached via phone number in pt's chart. Patient currently presents with deficits in strength, coordination, balance, mobility, and cognition. Patient demonstrates movement at all limbs but does not move in response to verbal commands to do so. Pt requires physical assistance to initiate all tasks, needing TA for bed mobility and maxA to initiate transfer to stand. However, pt progressed to only needing modA by the end of the sit to stand transfer as he activated his lower extremity muscles. Pt able to pivot on his feet with min-modA but not clear his feet to  take steps today. Recommendation for SNF rehab pending patient progress. If patient begins following 1-step verbal commands he may progress beyond needing SNF rehab and would likely greatly benefit from intensive therapies in the CIR setting as pt was likely independent PTA as he is young in age. PT will continue to follow acutely.     Follow Up Recommendations SNF;Supervision/Assistance - 24 hour (if pt's cognitive status improves to follow commands he would greatly benefit from CIR)    Equipment Recommendations  Other (comment) (unknownwhat pt owns at this time)    Recommendations for Other Services       Precautions / Restrictions Precautions Precautions: Fall Restrictions Weight Bearing Restrictions: No      Mobility  Bed Mobility Overal bed mobility: Needs Assistance Bed Mobility: Supine to Sit;Sit to Supine     Supine to sit: Total assist;HOB elevated Sit to supine: Total assist   General bed mobility comments: Pt not initiating movement despite verbal and tactile cues. Needs TA to initiate and complete all bed mobility. Pt resisting PROM of R shoudler to reach to L bed rail with grimacing noted.    Transfers Overall transfer level: Needs assistance   Transfers: Sit to/from Stand Sit to Stand: Max assist         General transfer comment: MaxA with bil knee block and physical assistance under buttocks/trunk to initiate coming to stand. progressed to only needing modA by end of rise to stand.  Ambulation/Gait             General Gait Details: Pt unable to lift feet, just pivoting today with min-modA and bil knee block.  Stairs  Wheelchair Mobility    Modified Rankin (Stroke Patients Only) Modified Rankin (Stroke Patients Only) Pre-Morbid Rankin Score: No symptoms Modified Rankin: Severe disability     Balance Overall balance assessment: Needs assistance Sitting-balance support: Bilateral upper extremity supported;Feet  supported Sitting balance-Leahy Scale: Poor Sitting balance - Comments: Min guard for static sitting EOB with UE support.   Standing balance support: No upper extremity supported Standing balance-Leahy Scale: Poor Standing balance comment: Pt not holding onto therapist or any object, provided bil knee block and assistance at buttocks to extend hips, min-modA for standing statically and pivoting on feet slightly.                             Pertinent Vitals/Pain Pain Assessment: Faces Faces Pain Scale: Hurts even more Pain Location: Generalized with movement, especially in his shoulders Pain Descriptors / Indicators: Discomfort;Grimacing;Guarding;Moaning Pain Intervention(s): Limited activity within patient's tolerance;Monitored during session;Repositioned (notified RN)    Home Living Family/patient expects to be discharged to:: Private residence Living Arrangements: Alone Available Help at Discharge: Other (Comment) (Unknown)             Additional Comments: Pt unable to communicate PLOF or home set-up and unable to reach significant other via their phone number provided in his chart. Will need to follow-up.    Prior Function Level of Independence: Independent         Comments: Pt unable to communicate PLOF or home set-up and unable to reach significant other via their phone number provided in his chart. Will need to follow-up.     Hand Dominance        Extremity/Trunk Assessment   Upper Extremity Assessment Upper Extremity Assessment: Defer to OT evaluation    Lower Extremity Assessment Lower Extremity Assessment: Generalized weakness;Difficult to assess due to impaired cognition (pt not following cues or communicating to formally assess)    Cervical / Trunk Assessment Cervical / Trunk Assessment: Normal  Communication   Communication: Expressive difficulties (global aphasia?, responds by opening eyes to his name and when cued to open his eyes, but  no other verbal cues; pt only mumbles "mhm")  Cognition Arousal/Alertness: Lethargic Behavior During Therapy: Flat affect Overall Cognitive Status: Impaired/Different from baseline Area of Impairment: Attention;Following commands;Awareness;Problem solving;Safety/judgement                 Orientation Level: Disoriented to;Person;Place;Time;Situation Current Attention Level: Divided   Following Commands: Follows one step commands inconsistently;Follows one step commands with increased time Safety/Judgement: Decreased awareness of safety;Decreased awareness of deficits Awareness: Intellectual Problem Solving: Slow processing;Decreased initiation;Difficulty sequencing;Requires verbal cues;Requires tactile cues General Comments: Patient is unable to speak. Does not follow commands to squeeze this therapist's hand or blink 2x in a row to express understanding. Pt would open eyes when his name was spoken or when verbally cued to "open eyes" though. Needs extensive physical assistance to initiate all tasks as pt not initiating when cued.      General Comments General comments (skin integrity, edema, etc.): 1L/min O2 via [place], desat to 88% on RA thus re-donned Rh[place] with it improving to >/= 94%; myoclonic jerking throuhgout limbs increased when in more upright gravity-dependent position    Exercises     Assessment/Plan    PT Assessment Patient needs continued PT services  PT Problem List Decreased strength;Decreased activity tolerance;Decreased range of motion;Decreased mobility;Decreased balance;Decreased coordination;Decreased cognition;Decreased knowledge of use of DME;Decreased safety awareness;Decreased knowledge of precautions  PT Treatment Interventions DME instruction;Gait training;Functional mobility training;Therapeutic activities;Therapeutic exercise;Balance training;Neuromuscular re-education;Cognitive remediation;Patient/family education;Stair training    PT Goals (Current  goals can be found in the Care Plan section)  Acute Rehab PT Goals Patient Stated Goal: Unable to state. PT Goal Formulation: Patient unable to participate in goal setting Time For Goal Achievement: 10/13/20 Potential to Achieve Goals: Fair    Frequency Min 3X/week   Barriers to discharge        Co-evaluation               AM-PAC PT "6 Clicks" Mobility  Outcome Measure Help needed turning from your back to your side while in a flat bed without using bedrails?: Total Help needed moving from lying on your back to sitting on the side of a flat bed without using bedrails?: Total Help needed moving to and from a bed to a chair (including a wheelchair)?: A Lot Help needed standing up from a chair using your arms (e.g., wheelchair or bedside chair)?: A Lot Help needed to walk in hospital room?: Total Help needed climbing 3-5 steps with a railing? : Total 6 Click Score: 8    End of Session Equipment Utilized During Treatment: Gait belt Activity Tolerance: Patient limited by lethargy;Other (comment) (limited by ability to follow commands) Patient left: in bed;with call bell/phone within reach;with bed alarm set;with nursing/sitter in room Nurse Communication: Mobility status;Other (comment) (sats; pain noted with movement) PT Visit Diagnosis: Unsteadiness on feet (R26.81);Muscle weakness (generalized) (M62.81);Difficulty in walking, not elsewhere classified (R26.2);Other symptoms and signs involving the nervous system (R29.898)    Time: [id number] PT Time Calculation (min) (ACUTE ONLY): 18 min   Charges:   PT Evaluation $PT Eval Moderate Complexity: 1 Mod          Moishe Spice, PT, DPT Acute Rehabilitation Services  Pager: [phone] Office: [phone]   Orvan Falconer 09/29/2020, 2:49 PM

## 2020-09-30 DIAGNOSIS — I639 Cerebral infarction, unspecified: Secondary | ICD-10-CM | POA: Diagnosis not present

## 2020-09-30 DIAGNOSIS — D631 Anemia in chronic kidney disease: Secondary | ICD-10-CM | POA: Diagnosis not present

## 2020-09-30 DIAGNOSIS — I63139 Cerebral infarction due to embolism of unspecified carotid artery: Secondary | ICD-10-CM | POA: Diagnosis not present

## 2020-09-30 DIAGNOSIS — N2581 Secondary hyperparathyroidism of renal origin: Secondary | ICD-10-CM | POA: Diagnosis not present

## 2020-09-30 DIAGNOSIS — B9561 Methicillin susceptible Staphylococcus aureus infection as the cause of diseases classified elsewhere: Secondary | ICD-10-CM | POA: Diagnosis not present

## 2020-09-30 DIAGNOSIS — R7881 Bacteremia: Secondary | ICD-10-CM | POA: Diagnosis not present

## 2020-09-30 DIAGNOSIS — G9519 Other vascular myelopathies: Secondary | ICD-10-CM | POA: Diagnosis not present

## 2020-09-30 DIAGNOSIS — I132 Hypertensive heart and chronic kidney disease with heart failure and with stage 5 chronic kidney disease, or end stage renal disease: Secondary | ICD-10-CM | POA: Diagnosis not present

## 2020-09-30 DIAGNOSIS — A419 Sepsis, unspecified organism: Secondary | ICD-10-CM | POA: Diagnosis not present

## 2020-09-30 DIAGNOSIS — N186 End stage renal disease: Secondary | ICD-10-CM | POA: Diagnosis not present

## 2020-09-30 DIAGNOSIS — R0602 Shortness of breath: Secondary | ICD-10-CM | POA: Diagnosis not present

## 2020-09-30 DIAGNOSIS — R531 Weakness: Secondary | ICD-10-CM | POA: Diagnosis not present

## 2020-09-30 DIAGNOSIS — J189 Pneumonia, unspecified organism: Secondary | ICD-10-CM | POA: Diagnosis not present

## 2020-09-30 DIAGNOSIS — Z992 Dependence on renal dialysis: Secondary | ICD-10-CM | POA: Diagnosis not present

## 2020-09-30 DIAGNOSIS — I5032 Chronic diastolic (congestive) heart failure: Secondary | ICD-10-CM | POA: Diagnosis not present

## 2020-09-30 DIAGNOSIS — M5137 Other intervertebral disc degeneration, lumbosacral region: Secondary | ICD-10-CM | POA: Diagnosis not present

## 2020-09-30 DIAGNOSIS — I6340 Cerebral infarction due to embolism of unspecified cerebral artery: Secondary | ICD-10-CM | POA: Diagnosis not present

## 2020-09-30 LAB — CBC WITH DIFFERENTIAL/PLATELET
Abs Immature Granulocytes: 0.4 10*3/uL — ABNORMAL HIGH (ref 0.00–0.07)
Basophils Absolute: 0 10*3/uL (ref 0.0–0.1)
Basophils Relative: 0 %
Eosinophils Absolute: 0 10*3/uL (ref 0.0–0.5)
Eosinophils Relative: 0 %
HCT: 32 % — ABNORMAL LOW (ref 39.0–52.0)
Hemoglobin: 10.4 g/dL — ABNORMAL LOW (ref 13.0–17.0)
Lymphocytes Relative: 1 %
Lymphs Abs: 0.4 10*3/uL — ABNORMAL LOW (ref 0.7–4.0)
MCH: 26.5 pg (ref 26.0–34.0)
MCHC: 32.5 g/dL (ref 30.0–36.0)
MCV: 81.6 fL (ref 80.0–100.0)
Monocytes Absolute: 3.7 10*3/uL — ABNORMAL HIGH (ref 0.1–1.0)
Monocytes Relative: 9 %
Myelocytes: 1 %
Neutro Abs: 37 10*3/uL — ABNORMAL HIGH (ref 1.7–7.7)
Neutrophils Relative %: 89 %
Platelets: 149 10*3/uL — ABNORMAL LOW (ref 150–400)
RBC: 3.92 MIL/uL — ABNORMAL LOW (ref 4.22–5.81)
RDW: 15.8 % — ABNORMAL HIGH (ref 11.5–15.5)
WBC: 41.6 10*3/uL — ABNORMAL HIGH (ref 4.0–10.5)
nRBC: 0 /100 WBC
nRBC: 0.1 % (ref 0.0–0.2)

## 2020-09-30 LAB — LDL CHOLESTEROL, DIRECT: Direct LDL: 25.4 mg/dL (ref 0–99)

## 2020-09-30 LAB — COMPREHENSIVE METABOLIC PANEL
ALT: 15 U/L (ref 0–44)
AST: 38 U/L (ref 15–41)
Albumin: 2.4 g/dL — ABNORMAL LOW (ref 3.5–5.0)
Alkaline Phosphatase: 92 U/L (ref 38–126)
Anion gap: 19 — ABNORMAL HIGH (ref 5–15)
BUN: 69 mg/dL — ABNORMAL HIGH (ref 6–20)
CO2: 22 mmol/L (ref 22–32)
Calcium: 8.6 mg/dL — ABNORMAL LOW (ref 8.9–10.3)
Chloride: 95 mmol/L — ABNORMAL LOW (ref 98–111)
Creatinine, Ser: 8.63 mg/dL — ABNORMAL HIGH (ref 0.61–1.24)
GFR, Estimated: 7 mL/min — ABNORMAL LOW (ref >60)
Glucose, Bld: 120 mg/dL — ABNORMAL HIGH (ref 70–99)
Potassium: 4.9 mmol/L (ref 3.5–5.1)
Sodium: 136 mmol/L (ref 135–145)
Total Bilirubin: 1.5 mg/dL — ABNORMAL HIGH (ref 0.3–1.2)
Total Protein: 8 g/dL (ref 6.5–8.1)

## 2020-09-30 LAB — HCV RNA QUANT
HCV Quantitative Log: 6.243 log10 IU/mL (ref >1.70)
HCV Quantitative: 1750000 IU/mL (ref >50)

## 2020-09-30 LAB — CULTURE, BLOOD (ROUTINE X 2)

## 2020-09-30 LAB — MAGNESIUM: Magnesium: 2.6 mg/dL — ABNORMAL HIGH (ref 1.7–2.4)

## 2020-09-30 LAB — PHOSPHORUS: Phosphorus: 7.7 mg/dL — ABNORMAL HIGH (ref 2.5–4.6)

## 2020-09-30 MED ORDER — CLONIDINE HCL 0.1 MG/24HR TD PTWK
0.1000 mg | MEDICATED_PATCH | TRANSDERMAL | Status: DC
  Administered 2020-09-30: 0.1 mg via TRANSDERMAL
  Filled 2020-09-30 (×2): qty 1

## 2020-09-30 MED ORDER — METOPROLOL TARTRATE 5 MG/5ML IV SOLN
5.0000 mg | Freq: Four times a day (QID) | INTRAVENOUS | Status: DC
  Administered 2020-09-30 – 2020-10-01 (×3): 5 mg via INTRAVENOUS
  Filled 2020-09-30 (×3): qty 5

## 2020-09-30 MED ORDER — HYDRALAZINE HCL 20 MG/ML IJ SOLN
5.0000 mg | Freq: Once | INTRAMUSCULAR | Status: AC | PRN
  Administered 2020-09-30: 5 mg via INTRAVENOUS
  Filled 2020-09-30: qty 1

## 2020-09-30 MED ORDER — HYDRALAZINE HCL 20 MG/ML IJ SOLN
5.0000 mg | INTRAMUSCULAR | Status: DC | PRN
  Administered 2020-09-30 (×2): 5 mg via INTRAVENOUS
  Filled 2020-09-30 (×2): qty 1

## 2020-09-30 NOTE — Progress Notes (Signed)
STROKE TEAM PROGRESS NOTE   INTERVAL HISTORY No family at bedside. Pt drowsy sleepy, able to open eyes on voice but needed repetitive stimulation to keep eyes open and to answer some simple questions. Orientated to place and self, but not orientated to time or situation. Moving all extremities with stimulation. No significant fever, but still significant leukocytosis. Blood culture so far no growth. Still has high BP and HR. On vancomycin. Meningitis still in DDx, however, not good LP candidate.   Vitals:   09/30/20 0419 09/30/20 0700 09/30/20 0900 09/30/20 1200  BP: (!) 176/105 (!) 170/121 (!) 153/101 (!) 174/107  Pulse: (!) 105 99 99 (!) 111  Resp: (!) '21 20 18 20  '$ Temp: 98.6 F (37 C) 98.6 F (37 C) 98 F (36.7 C) 98.9 F (37.2 C)  TempSrc: Oral Axillary Axillary Oral  SpO2: 99% 100% 100% 98%  Weight:      Height:       CBC:  Recent Labs  Lab 09/29/20 1301 09/30/20 0610  WBC 41.8* 41.6*  NEUTROABS  --  37.0*  HGB 9.9* 10.4*  HCT 29.8* 32.0*  MCV 81.6 81.6  PLT 124* 123456*   Basic Metabolic Panel:  Recent Labs  Lab 09/29/20 1301 09/30/20 0610  NA 138 136  K 5.4* 4.9  CL 96* 95*  CO2 17* 22  GLUCOSE 94 120*  BUN 123* 69*  CREATININE 13.25* 8.63*  CALCIUM 8.4* 8.6*  MG  --  2.6*  PHOS 7.8* 7.7*   Lipid Panel:  Recent Labs  Lab 09/29/20 0435  CHOL 126  TRIG 342*  HDL <10*  CHOLHDL NOT CALCULATED  VLDL 68*  LDLCALC NOT CALCULATED   HgbA1c:  Recent Labs  Lab 09/29/20 0435  HGBA1C 5.5   Urine Drug Screen: No results for input(s): LABOPIA, COCAINSCRNUR, LABBENZ, AMPHETMU, THCU, LABBARB in the last 168 hours.  Alcohol Level No results for input(s): ETH in the last 168 hours.  MRA Neck Result date 09/28/2020 The visualized common carotid, internal carotid and vertebral arteries patent within the neck without appreciable stenosis.  MRA head WO contrast: Result date 09/28/2020 No intracranial large vessel occlusion or proximal high-grade arterial  stenosis.  MRI Brain WO contrast Result date 09/28/2020 1. 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 Multiple vascular territories are involved and findings are highly suspicious for an embolic process. 2. 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 A few scattered supratentorial chronic microhemorrhages are also present. 3. Mild generalized atrophy of the brain.  CT Head WO contrast Result date 09/28/2020 IMPRESSION: 1. 10 mm infarct within the medial left cerebellar hemisphere, which may be acute. Consider brain MRI for further evaluation. 2. No acute intracranial hemorrhage or acute demarcated cortical infarct.  EEG adult Result Date: 09/28/2020 IMPRESSION: This study initially showed generalized periodic discharges with triphasic morphology at 2-2.'5hz'$ . This eeg pattern can be on the ictal-interictal continuum and also seen with toxic-metabolic etiology. EEG improved after IV ativan which although not confirmatory is concerning for ictal nature of this pattern. There is also moderate diffuse encephalopathy, non specific etiology. One episode of right arm tremor was noted at 1528 during which eeg showed generalized periodic discharges with triphasic morphology at 2-2.5 without definite evolution. However, focal motor seizures may not be seen on scalp eeg. Therefore, clinical correlation is recommended. Patrick Anthony   ECHOCARDIOGRAM COMPLETE BUBBLE STUDY Result Date: 09/28/2020    1.  Left ventricular ejection fraction, by estimation, is 55 to 60%. The left ventricle has normal function. The left ventricle has no regional wall motion abnormalities. There is moderate concentric left ventricular hypertrophy. Left ventricular diastolic parameters are consistent with Grade II diastolic dysfunction (pseudonormalization).  2. Right  ventricular systolic function is normal. The right ventricular size is normal. There is normal pulmonary artery systolic pressure.  3. Left atrial size was moderately dilated.   4. Right atrial size was mild to moderately dilated.   5. The mitral valve is normal in structure. Mild to moderate mitral valve regurgitation. No evidence of mitral stenosis. Moderate to severe mitral annular calcification.   6. The aortic valve is tricuspid. Aortic valve regurgitation is trivial. No aortic stenosis is present.   7. The inferior vena cava is normal in size with greater than 50% respiratory variability, suggesting right atrial pressure of 3 mmHg.   8. Agitated saline contrast bubble study was negative, with no evidence of any interatrial shunt.   PHYSICAL EXAM  Temp:  [98 F (36.7 C)-99.6 F (37.6 C)] 99 F (37.2 C) (02/24 1814) Pulse Rate:  [96-113] 113 (02/24 1814) Resp:  [17-21] 17 (02/24 1814) BP: (153-189)/(98-121) 170/116 (02/24 1814) SpO2:  [97 %-100 %] 99 % (02/24 1814)  General - Well nourished, well developed, drowsy sleepy.  Ophthalmologic - fundi not visualized due to noncooperation.  Cardiovascular - Regular rhythm and tachycardia.  Neuro - drowsy sleepy, needed repetitive stimulation to keep eyes open, orientated to self, place, but not to time or situation. Paucity of speech, only speak words to answer orientation questions, but able to say "OK" "1982", able to name 2/4 with paraphasic errors, following limited simple commands with frequent prompt. No gaze palsy, but did not track, visual field not able to test as pt keep closing his eyes, PERRL. No facial droop. Tongue midline. Bilateral UEs 3/5, with slow drift. Bilaterally LEs 3-/5 with pain stimulation. Sensation and coordination not cooperative, gait not tested.   ASSESSMENT/PLAN [name] is a 50 y.o. male with history of end-stage renal disease due to adult polycystic kidney disease on hemodialysis Monday, Wednesday,  Friday with left upper extremity AV graft, hypertension, obstructive sleep apnea, chronic heart failure with preserved ejection fraction, hepatitis C with liver cirrhosis, and history of alcohol abuse who presented to the hospital 09/27/20 for evaluation of generalized weakness, lethargy, tachycardia, fever, and chills. He was found to be septic with a temperature of 103.2, a heart rate in the 140's, leukocytosis, and an elevated lactic acidosis with lactic acid of 3.5; presumably secondary to a left lower lobe pneumonia identified on x-ray.  On 09/28/2020 at [place] he developed word finding difficulty and aphasia.  He was also noted to have tremor of the upper extremities, subsequently a code stroke was activated and he was taken to CT for imaging.  No TPA was given as he was outside of time window (LKN 0100).  He was not a thrombectomy candidate as no LVO seen on imaging.      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    MRI head: Scattered small acute infarcts within the bilateral cerebral hemispheres, right basal ganglia, callosal splenium and bilateral cerebellar hemispheres  MRA head and neck - negative  2D Echo EF 55-60%, Grade II diastolic dysfunction, moderate LVH  Recommend TEE to  rule out infective endocardiditis  Direct LDL - 25.4  Triglycerides 342  HgbA1c 5.5  VTE prophylaxis - Heparin with dialysis  NPO: speech therapy is following  No antithrombotic prior to admission, now on No antithrombotic due to the possibility of endocarditis  Therapy recommendations:  SNF  Disposition: pending  Tremor vs. Myoclonus   EEG: moderate diffuse encephalopathy, non specific etiology.  Multiple episodes of right arm tremor were noted without concomitant EEG change and are most likely not epileptic.    Likely due to encephalopathy    Ammonia 27  Continue to treat underlying disease  ? MRSA bactermia ? Bacterial meningitis  Fever  leukocytosis  Tmax 100.6 -> 98.9  WBC 23.2->29.6->41.8->41.6  Highly suspicious for endocarditis  Can not rule out meningitis - however not good candidate for LP given thrombocytopenia, elevated INR and agitation not cooperative - LP once able - repeat CBC and INR in am  Will need TEE   Cardiology following   ID following on board - MRI T/L spine no source of infection - Vancomycin started 2/23 (6 weeks) - recommend CNS converage  ESRD on HD  Nephrology on board  HD per schedule  Hypertension  Home meds:  Amlodipine '10mg'$  daily, clonodine 0.'3mg'$  tid,  On the high end  Gradually normalize BP in 2-3 days . BP goal normotensive  Hyperlipidemia  Home meds:  none  Direct LDL - 25.4, goal < 70  TG 342  No statin needed at this time given low LDL  Tobacco Abuse  Current smoker  Will need smoking cessation counseling once appropriate  Nicotine patch  Other Stroke Risk Factors  ETOH use  Coronary artery disease  Obstructive sleep apnea, unclear if he is on CPAP at home  Congestive heart failure diastolic CHF (0000000)  Other Active problems  Hepatitis C/ Cirrhosis of liver   Anemia of chronic disease - Hgb - 9.3->9.9->10.4  Hospital day # 3  I spent  35 minutes in total face-to-face time with the patient, more than 50% of which was spent in counseling and coordination of care, reviewing test results, images and medication, and discussing the diagnosis, treatment plan and potential prognosis. This patient's care requiresreview of multiple databases, neurological assessment, discussion with family, other specialists and medical decision making of high complexity. I have discussed with Dr. Florene Glen and Dr. Debroah Baller, MD PhD Stroke Neurology 09/30/2020 7:21 PM   To contact Stroke Continuity provider, please  refer to [email or web address] After hours, contact General Neurology

## 2020-09-30 NOTE — Progress Notes (Signed)
PROGRESS NOTE    Patrick Anthony  [id number] DOB: Jan 13, 1971 DOA: 09/27/2020 PCP: [name], MD  Chief Complaint  Patient presents with  . Generalized Body Aches    Brief Narrative:  50 year old male with medical history of end-stage renal disease due to APKD on hemodialysis MWF through left upper extremity AV graft, hypertension, OSA, chronic diastolic CHF, EF 55 to [id number], liver cirrhosis due to hepatitis C, history of alcohol abuse came to hospital with generalized weakness. Chest x-ray showed possible opacity at left lower lobe is concerning for pneumonia. CT abdomen/pelvis showed findings of adult polycystic kidney disease. Blood cultures now showing staph aureus.  ID is following.  Hospitalization complicated by embolic stroke with presumed endocarditis in setting of his bacteremia.  Assessment & Plan:   Principal Problem:   Pneumonia due to infectious agent Active Problems:   Chronic diastolic CHF (congestive heart failure) (HCC)   HTN (hypertension)   Anemia in chronic kidney disease   ESRD on dialysis ([place])   Sepsis ([place])   ADPKD (autosomal dominant polycystic kidney disease)   Sepsis due to pneumonia (HCC)   Lactic acidosis   Cerebral embolism with cerebral infarction   Bacteremia due to Staphylococcus aureus  1. New onset aphasia  Scattered Small Acute Infarcts within Bilateral Cerebral Hemispheres, Right Basal Ganglia, Callosal Splenium, and Bilateral Cerebellar Hemispheres  Concerning for Embolic Process: 1. Concerning for embolic stroke from endocarditis in setting of below 2. TEE pending (unable to perform at this time due to AMS - follow for opportunity for TEE) 3. Echo with EF 55-60%, grade II diastolic dysfunction (no intracardiac source of embolism detected on transthoracic study) 4. MRA head/neck without appreciable stenosis in common carotid, internal carotid, vertebral arteries.  No intracranial LVO or proximal high grade arterial stenosis.  5. HDL <10,  LDL not calculated - A1c 5.5 6. Holding anitplatelets/anticoagulants at this time until further w/u to rule out endocarditis and mycotic aneurysms 7. Neurology consult, appreciate recommendations  2. MRSA Bacteremia  Presumed Endocarditis  Possible Pneumonia 1. Presumed endocarditis in setting of his embolic stroke above 2. TEE pending (as above) 3. Given AMS, needs LP as well - planning to defer for now with recent thrombocytopenia, elevated INR, and AMS (inability to cooperate with exam) will defer for now (discussed with neurology/ID) 4. 2/21 cx with MRSA bacteremia - sensitive to vancomycin 5. Repeat blood cultures 2/24 (pending) 6. CT abdomen pelvis without contrast on presentation with findings c/w PCKD, equivocal thickening of urinary bladder, no abscess in abdomen/pelvis (see report) 7. CXR 2/21 with streaky L basilar opacities 8. 2/11 right shoulder imaging with 9 cm calcified soft tissue mass (calcinosis of chronic renal failure) 9. No thoracolumbar discitis-osteomyelitis or epidural collection (see imaging result) 2/24 10. CT chest abdomen pelvis 2/23 with cavitary mass in LUL apex (2/2 cavitary infection vs septic embolus), small foci of consolidation and ground glass density in RUL and small ground glass nodule in LUL (infectious vs inflammatory), polycystic kidney disease, no CT evidence for acute intraabdominal or pelvic abnormality.  Calcified mass anterior to R shoulder with smller L paraspinal soft tissue calcification probably due to calcinosis. 11. Continue vancomycin per ID 12. Persistent leukocytosis - will continue to follow on abx, w/u for source as above  3. Acute Metabolic Encephalopathy 1. Likely related to infection above 2. Likely would benefit from LP, deferred at this time due to inability to cooperate with exam and recent thrombocytopenia/elevated INR per neurology, follow   4. Seizure Like Activity of Upper  Extremities 1. LTM EEG per neurology - mod diffuse  encephalopathy  2. 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 ESRD due to adult polycystic kidney disease  Hyperkalemia  AGMA-patient gets dialysis MWF through left upper extremity fistula, continue PhosLo for hyperphosphatemia, Sensipar for secondary hyperparathyroidism.  1. Nephrology following.  6. Anemia of chronic disease-  1. Relatively stable 2. Continue to monitor.  3. Patient is on ESA as outpatient.  7. Hypertension  1. Clonidine patch while not consistently taking PO 2. IV hydralazine prn 3. Scheduled metoprolol  4. amlodipine 10 mg daily.  5. As able will restart PO meds 6. Schedule low dose metop to avoid rebound tachycardia  8. Sinus Tachycardia 1. 2/2 pain vs rebound tachycardia with metop on hold vs worsening infection (with worsening leukocytosis) 2. Treat pain, schedule metop - follow w/u for infection above 3. EKG with sinus tachycardia, appears similar to priors  9. Liver cirrhosis due to hepatitis C-patient has chronic HCV infection, HIV Shelda Pal was nonreactive during prior hospital stay.  10. Thrombocytopenia 1. In setting of sepsis, follow  DVT prophylaxis: SCD Code Status: full  Family Communication: none at bedside - called listed contact (significant other - call did not go through)  Disposition:   Status is: Inpatient  Remains inpatient appropriate because:Inpatient level of care appropriate due to severity of illness   Dispo: The patient is from: Home              Anticipated d/c is to: pending              Anticipated d/c date is: > 3 days              Patient currently is not medically stable to d/c.   Difficult to place patient No  Consultants:   Renal  ID  Neurology  cardiology  Procedures:  TTE IMPRESSIONS    1. Left ventricular ejection fraction, by estimation, is 55 to 60%. The  left ventricle has normal function. The left ventricle has no regional  wall motion abnormalities. There is moderate concentric  left ventricular  hypertrophy. Left ventricular  diastolic parameters are consistent with Grade II diastolic dysfunction  (pseudonormalization).  2. Right ventricular systolic function is normal. The right ventricular  size is normal. There is normal pulmonary artery systolic pressure.  3. Left atrial size was moderately dilated.  4. Right atrial size was mild to moderately dilated.  5. The mitral valve is normal in structure. Mild to moderate mitral valve  regurgitation. No evidence of mitral stenosis. Moderate to severe mitral  annular calcification.  6. The aortic valve is tricuspid. Aortic valve regurgitation is trivial.  No aortic stenosis is present.  7. The inferior vena cava is normal in size with greater than 50%  respiratory variability, suggesting right atrial pressure of 3 mmHg.  8. Agitated saline contrast bubble study was negative, with no evidence  of any interatrial shunt.   Conclusion(s)/Recommendation(s): No intracardiac source of embolism  detected on this transthoracic study. Chalese Peach transesophageal echocardiogram is  recommended to exclude cardiac source of embolism if clinically indicated.  Antimicrobials: Anti-infectives (From admission, onward)   Start     Dose/Rate Route Frequency Ordered Stop   09/29/20 1200  vancomycin (VANCOCIN) IVPB 1000 mg/200 mL premix        1,000 mg 200 mL/hr over 60 Minutes Intravenous Every M-W-F (Hemodialysis) 09/27/20 1443     09/29/20 1200  ceFEPIme (MAXIPIME) 2 g in sodium chloride 0.9 % 100 mL  IVPB  Status:  Discontinued        2 g 200 mL/hr over 30 Minutes Intravenous Every M-W-F (Hemodialysis) 09/27/20 1443 09/28/20 1054   09/28/20 0900  metroNIDAZOLE (FLAGYL) IVPB 500 mg  Status:  Discontinued        500 mg 100 mL/hr over 60 Minutes Intravenous Every 12 hours 09/27/20 1808 09/28/20 1054   09/28/20 0900  ceFEPIme (MAXIPIME) 1 g in sodium chloride 0.9 % 100 mL IVPB  Status:  Discontinued        1 g 200 mL/hr over 30  Minutes Intravenous Every 12 hours 09/27/20 1808 09/27/20 1812   09/27/20 1445  vancomycin (VANCOREADY) IVPB 2000 mg/400 mL        2,000 mg 200 mL/hr over 120 Minutes Intravenous  Once 09/27/20 1433 09/27/20 2330   09/27/20 1430  ceFEPIme (MAXIPIME) 2 g in sodium chloride 0.9 % 100 mL IVPB        2 g 200 mL/hr over 30 Minutes Intravenous  Once 09/27/20 1418 09/27/20 1541   09/27/20 1430  metroNIDAZOLE (FLAGYL) IVPB 500 mg        500 mg 100 mL/hr over 60 Minutes Intravenous  Once 09/27/20 1418 09/27/20 2214   09/27/20 1430  vancomycin (VANCOREADY) IVPB 1750 mg/350 mL  Status:  Discontinued        1,750 mg 175 mL/hr over 120 Minutes Intravenous  Once 09/27/20 1418 09/27/20 1433     Subjective: Breslyn Abdo&Ox1 today Denies pain, answers simple questions today, but not able to participate much more  Objective: Vitals:   09/30/20 0700 09/30/20 0900 09/30/20 1200 09/30/20 1507  BP: (!) 170/121 (!) 153/101 (!) 174/107 (!) 189/120  Pulse: 99 99 (!) 111 (!) 106  Resp: '20 18 20   '$ Temp: 98.6 F (37 C) 98 F (36.7 C) 98.9 F (37.2 C) 99.6 F (37.6 C)  TempSrc: Axillary Axillary Oral   SpO2: 100% 100% 98% 97%  Weight:      Height:        Intake/Output Summary (Last 24 hours) at 09/30/2020 1539 Last data filed at 09/29/2020 1550 Gross per 24 hour  Intake -  Output 385 ml  Net -385 ml   Filed Weights   09/27/20 1400 09/29/20 1240  Weight: 83.9 kg 84.3 kg    Examination:  General: No acute distress. Cardiovascular: RRR Lungs: Clear to auscultation bilaterally Abdomen: Soft, nontender, nondistended Neurological: Alert and oriented 1. Not consistently following commands.  Moves all extremities 4. Cranial nerves II through XII grossly intact. Skin: Warm and dry. No rashes or lesions. Extremities: No clubbing or cyanosis. No edema   Data Reviewed: I have personally reviewed following labs and imaging studies  CBC: Recent Labs  Lab 09/27/20 1125 09/28/20 0436 09/29/20 1301  09/30/20 0610  WBC 23.2* 29.6* 41.8* 41.6*  NEUTROABS  --   --   --  37.0*  HGB 11.0* 9.3* 9.9* 10.4*  HCT 32.6* 28.1* 29.8* 32.0*  MCV 82.3 81.7 81.6 81.6  PLT 83* 88* 124* 149*    Basic Metabolic Panel: Recent Labs  Lab 09/27/20 1125 09/27/20 1416 09/28/20 0436 09/29/20 1301 09/30/20 0610  NA 137 137 135 138 136  K 4.4 4.6 5.0 5.4* 4.9  CL 92* 92* 96* 96* 95*  CO2 '25 23 22 '$ 17* 22  GLUCOSE 86 93 119* 94 120*  BUN 45* 48* 76* 123* 69*  CREATININE 9.82* 10.30* 11.10* 13.25* 8.63*  CALCIUM 8.8* 8.6* 8.0* 8.4* 8.6*  MG  --   --   --   --  2.6*  PHOS  --   --   --  7.8* 7.7*    GFR: Estimated Creatinine Clearance: 11.4 mL/min (Denorris Reust) (by C-G formula based on SCr of 8.63 mg/dL (H)).  Liver Function Tests: Recent Labs  Lab 09/27/20 1416 09/29/20 1301 09/30/20 0610  AST 42* 54* 38  ALT '15 13 15  '$ ALKPHOS 107 93 92  BILITOT 2.1* 1.8* 1.5*  PROT 8.4* 8.0 8.0  ALBUMIN 2.9* 2.3*  2.3* 2.4*    CBG: Recent Labs  Lab 09/28/20 0721 09/28/20 0928  GLUCAP 92 103*     Recent Results (from the past 240 hour(s))  Blood Culture (routine x 2)     Status: Abnormal   Collection Time: 09/27/20  2:16 PM   Specimen: BLOOD RIGHT HAND  Result Value Ref Range Status   Specimen Description BLOOD RIGHT HAND  Final   Special Requests   Final    BOTTLES DRAWN AEROBIC AND ANAEROBIC Blood Culture results may not be optimal due to an inadequate volume of blood received in culture bottles Performed at [facility] [address] [place], [place] [address]    Culture  Setup Time   Final    GRAM POSITIVE COCCI IN CLUSTERS IN BOTH AEROBIC AND ANAEROBIC BOTTLES    Culture METHICILLIN RESISTANT STAPHYLOCOCCUS AUREUS (Cleve Paolillo)  Final   Report Status 09/30/2020 FINAL  Final   Organism ID, Bacteria METHICILLIN RESISTANT STAPHYLOCOCCUS AUREUS  Final      Susceptibility   Methicillin resistant staphylococcus aureus - MIC*    CIPROFLOXACIN >=8 RESISTANT Resistant     ERYTHROMYCIN >=8  RESISTANT Resistant     GENTAMICIN <=0.5 SENSITIVE Sensitive     OXACILLIN >=4 RESISTANT Resistant     TETRACYCLINE <=1 SENSITIVE Sensitive     VANCOMYCIN <=0.5 SENSITIVE Sensitive     TRIMETH/SULFA <=10 SENSITIVE Sensitive     CLINDAMYCIN <=0.25 SENSITIVE Sensitive     RIFAMPIN <=0.5 SENSITIVE Sensitive     Inducible Clindamycin NEGATIVE Sensitive     * METHICILLIN RESISTANT STAPHYLOCOCCUS AUREUS  Resp Panel by RT-PCR (Flu Aryan Sparks&B, Covid) Nasopharyngeal Swab     Status: None   Collection Time: 09/27/20  2:16 PM   Specimen: Nasopharyngeal Swab; Nasopharyngeal(NP) swabs in vial transport medium  Result Value Ref Range Status   SARS Coronavirus 2 by RT PCR NEGATIVE NEGATIVE Final    Comment: (NOTE) SARS-CoV-2 target nucleic acids are NOT DETECTED.  The SARS-CoV-2 RNA is generally detectable in upper respiratory specimens during the acute phase of infection. The lowest concentration of SARS-CoV-2 viral copies this assay can detect is 138 copies/mL. Maryellen Dowdle negative result does not preclude SARS-Cov-2 infection and should not be used as the sole basis for treatment or other patient management decisions. Matteo Banke negative result may occur with  improper specimen collection/handling, submission of specimen other than nasopharyngeal swab, presence of viral mutation(s) within the areas targeted by this assay, and inadequate number of viral copies(<138 copies/mL). Lawrance Wiedemann negative result must be combined with clinical observations, patient history, and epidemiological information. The expected result is Negative.  Fact Sheet for Patients:  EntrepreneurPulse.com.au  Fact Sheet for Healthcare Providers:  IncredibleEmployment.be  This test is no t yet approved or cleared by the [place] FDA and  has been authorized for detection and/or diagnosis of SARS-CoV-2 by FDA under an Emergency Use Authorization (EUA). This EUA will remain  in effect (meaning this test can be used)  for the duration of the COVID-19 declaration under Section 564(b)(1) of the Act,  21 U.S.C.section 360bbb-3(b)(1), unless the authorization is terminated  or revoked sooner.       Influenza Avalyn Molino by PCR NEGATIVE NEGATIVE Final   Influenza B by PCR NEGATIVE NEGATIVE Final    Comment: (NOTE) The Xpert Xpress SARS-CoV-2/FLU/RSV plus assay is intended as an aid in the diagnosis of influenza from Nasopharyngeal swab specimens and should not be used as Paije Goodhart sole basis for treatment. Nasal washings and aspirates are unacceptable for Xpert Xpress SARS-CoV-2/FLU/RSV testing.  Fact Sheet for Patients: EntrepreneurPulse.com.au  Fact Sheet for Healthcare Providers: IncredibleEmployment.be  This test is not yet approved or cleared by the [place] FDA and has been authorized for detection and/or diagnosis of SARS-CoV-2 by FDA under an Emergency Use Authorization (EUA). This EUA will remain in effect (meaning this test can be used) for the duration of the COVID-19 declaration under Section 564(b)(1) of the Act, 21 U.S.C. section 360bbb-3(b)(1), unless the authorization is terminated or revoked.  Performed at [facility], [place] [address], [place], [place] [address]   Blood Culture ID Panel (Reflexed)     Status: Abnormal   Collection Time: 09/27/20  2:16 PM  Result Value Ref Range Status   Enterococcus faecalis NOT DETECTED NOT DETECTED Final   Enterococcus Faecium NOT DETECTED NOT DETECTED Final   Listeria monocytogenes NOT DETECTED NOT DETECTED Final   Staphylococcus species DETECTED (Renard Caperton) NOT DETECTED Final    Comment: CRITICAL RESULT CALLED TO, READ BACK BY AND VERIFIED WITH: C,PIERCE PHARMD '@1009'$  09/28/20 EB    Staphylococcus aureus (BCID) DETECTED (Quantavius Humm) NOT DETECTED Final    Comment: Methicillin (oxacillin)-resistant Staphylococcus aureus (MRSA). MRSA is predictably resistant to beta-lactam antibiotics (except ceftaroline). Preferred therapy is  vancomycin unless clinically contraindicated. Patient requires contact precautions if  hospitalized. CRITICAL RESULT CALLED TO, READ BACK BY AND VERIFIED WITH: C,PIERCE PHARMD '@1009'$  09/28/20 EB    Staphylococcus epidermidis NOT DETECTED NOT DETECTED Final   Staphylococcus lugdunensis NOT DETECTED NOT DETECTED Final   Streptococcus species NOT DETECTED NOT DETECTED Final   Streptococcus agalactiae NOT DETECTED NOT DETECTED Final   Streptococcus pneumoniae NOT DETECTED NOT DETECTED Final   Streptococcus pyogenes NOT DETECTED NOT DETECTED Final   Raji Glinski.calcoaceticus-baumannii NOT DETECTED NOT DETECTED Final   Bacteroides fragilis NOT DETECTED NOT DETECTED Final   Enterobacterales NOT DETECTED NOT DETECTED Final   Enterobacter cloacae complex NOT DETECTED NOT DETECTED Final   Escherichia coli NOT DETECTED NOT DETECTED Final   Klebsiella aerogenes NOT DETECTED NOT DETECTED Final   Klebsiella oxytoca NOT DETECTED NOT DETECTED Final   Klebsiella pneumoniae NOT DETECTED NOT DETECTED Final   Proteus species NOT DETECTED NOT DETECTED Final   Salmonella species NOT DETECTED NOT DETECTED Final   Serratia marcescens NOT DETECTED NOT DETECTED Final   Haemophilus influenzae NOT DETECTED NOT DETECTED Final   Neisseria meningitidis NOT DETECTED NOT DETECTED Final   Pseudomonas aeruginosa NOT DETECTED NOT DETECTED Final   Stenotrophomonas maltophilia NOT DETECTED NOT DETECTED Final   Candida albicans NOT DETECTED NOT DETECTED Final   Candida auris NOT DETECTED NOT DETECTED Final   Candida glabrata NOT DETECTED NOT DETECTED Final   Candida krusei NOT DETECTED NOT DETECTED Final   Candida parapsilosis NOT DETECTED NOT DETECTED Final   Candida tropicalis NOT DETECTED NOT DETECTED Final   Cryptococcus neoformans/gattii NOT DETECTED NOT DETECTED Final   Meth resistant mecA/C and MREJ DETECTED (Briona Korpela) NOT DETECTED Final    Comment: CRITICAL RESULT CALLED TO, READ BACK BY AND VERIFIED WITH: C,PIERCE PHARMD '@1009'$   09/28/20  EB Performed at [facility], [place] [address], [place], [place] [address]   Blood Culture (routine x 2)     Status: Abnormal (Preliminary result)   Collection Time: 09/27/20  2:29 PM   Specimen: BLOOD  Result Value Ref Range Status   Specimen Description BLOOD SITE NOT SPECIFIED  Final   Special Requests   Final    BOTTLES DRAWN AEROBIC AND ANAEROBIC Blood Culture results may not be optimal due to an inadequate volume of blood received in culture bottles   Culture  Setup Time   Final    GRAM POSITIVE COCCI IN CLUSTERS ANAEROBIC BOTTLE ONLY CRITICAL VALUE NOTED.  VALUE IS CONSISTENT WITH PREVIOUSLY REPORTED AND CALLED VALUE.    Culture (Estella Malatesta)  Final    STAPHYLOCOCCUS AUREUS SUSCEPTIBILITIES PERFORMED ON PREVIOUS CULTURE WITHIN THE LAST 5 DAYS. Performed at [facility], [place] [address] [place], [place] [address]    Report Status PENDING  Incomplete         Radiology Studies: CT CHEST W CONTRAST  Result Date: 09/29/2020 CLINICAL DATA:  Sepsis abdominal pain EXAM: CT CHEST, ABDOMEN, AND PELVIS WITH CONTRAST TECHNIQUE: Multidetector CT imaging of the chest, abdomen and pelvis was performed following the standard protocol during bolus administration of intravenous contrast. CONTRAST:  183m OMNIPAQUE IOHEXOL 300 MG/ML  SOLN COMPARISON:  CT 09/27/2020, shoulder x-ray 09/17/2020 FINDINGS: CT CHEST FINDINGS Cardiovascular: Nonaneurysmal aorta. Mild aortic atherosclerosis. No aneurysm or dissection. Cardiomegaly without significant pericardial effusion. 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 Mediastinum/Nodes: Midline trachea. No thyroid mass. No suspicious adenopathy. Esophagus within normal limits. Lungs/Pleura: No pleural effusion or pneumothorax. Cavitary mass in the left upper lobe apex measuring 1.9 x 2.2 cm with surrounding ground-glass density, series 5, image number 43. 4 mm ground-glass  nodule within the posterior left upper lobe, series 5, image number 78. small focus of consolidation and ground-glass density in the posterior right upper lobe, series 5, image number 67. Streaky atelectasis in the lower lobes. Musculoskeletal: Sternum is intact. Vertebral body heights are normal. Large calcified mass anterior to right shoulder. Similar smaller calcified mass within the left posterior paraspinal soft tissues likely due to calcinosis. CT ABDOMEN PELVIS FINDINGS Hepatobiliary: No focal liver abnormality is seen. No gallstones, gallbladder wall thickening, or biliary dilatation. Pancreas: Unremarkable. No pancreatic ductal dilatation or surrounding inflammatory changes. Spleen: Normal in size without focal abnormality. Adrenals/Urinary Tract: Adrenal glands are normal. Polycystic kidney disease. Scattered parenchymal calcifications and cysts with mild calcifications. Chunky calcification posterior lower pole left kidney measuring 2 cm. No hydronephrosis. Empty urinary bladder. Stomach/Bowel: Stomach is within normal limits. Appendix appears normal. No evidence of bowel wall thickening, distention, or inflammatory changes. Vascular/Lymphatic: Mild to moderate aortic atherosclerosis without aneurysm. No suspicious nodes. Reproductive: Prostate unremarkable.  Seminal vesicle calcification Other: Negative for free air or free fluid. Musculoskeletal: No acute osseous abnormality. Postsurgical changes of the right femur. Multiple metallic foreign bodies in the right thigh. IMPRESSION: 1. Cavitary mass in the left upper lobe apex measuring up to 2.2 cm with surrounding ground-glass density. Findings could be secondary to cavitary infection or potential septic embolus. Jacinda Kanady cavitary neoplasm is also in the differential. There are additional small foci of consolidation and ground-glass density in the right upper lobe and small ground-glass nodule in left upper lobe, potentially infectious or inflammatory. Short  interval chest CT follow-up is recommended. 2. Polycystic kidney disease. 3. No CT evidence for acute intra-abdominal or pelvic abnormality.  4. 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 Aortic Atherosclerosis (ICD10-I70.0). Electronically Signed   By: Donavan Foil M.D.   On: 09/29/2020 20:54   MR THORACIC SPINE WO CONTRAST  Result Date: 09/30/2020 CLINICAL DATA:  Bacteremia with generalized weakness EXAM: MRI THORACIC AND LUMBAR SPINE WITHOUT CONTRAST TECHNIQUE: Multiplanar and multiecho pulse sequences of the thoracic and lumbar spine were obtained without intravenous contrast. COMPARISON:  None. FINDINGS: MRI THORACIC SPINE FINDINGS Alignment:  Physiologic. Vertebrae: There is bone marrow edema and disc space edema at C6-7. Bone marrow signal is normal throughout the thoracic spine. Cord:  Normal signal and morphology. Paraspinal and other soft tissues: Negative. Disc levels: No thoracic spinal canal stenosis. MRI LUMBAR SPINE FINDINGS Segmentation:  Standard Alignment:  There is normal Vertebrae: Modic type 2 endplate signal changes at L5-S1. No discitis-osteomyelitis or epidural collection. Conus medullaris and cauda equina: Conus extends to the L1 level. Conus and cauda equina appear normal. Paraspinal and other soft tissues: Polycystic kidneys. Disc levels: Axial images severely degraded by motion. There is no spinal canal stenosis. At L5-S1 there is Lorean Ekstrand broad disc bulge but no neural foraminal stenosis. IMPRESSION: 1. No thoracolumbar discitis-osteomyelitis or epidural collection. 2. Mild bone marrow edema and disc space edema at C6-C7, likely degenerative. 3. Mild L5-S1 degenerative disc disease without spinal canal or neural foraminal stenosis. Electronically Signed   By: Ulyses Jarred M.D.   On: 09/30/2020 00:58   MR LUMBAR SPINE WO CONTRAST  Result Date: 09/30/2020 CLINICAL  DATA:  Bacteremia with generalized weakness EXAM: MRI THORACIC AND LUMBAR SPINE WITHOUT CONTRAST TECHNIQUE: Multiplanar and multiecho pulse sequences of the thoracic and lumbar spine were obtained without intravenous contrast. COMPARISON:  None. FINDINGS: MRI THORACIC SPINE FINDINGS Alignment:  Physiologic. Vertebrae: There is bone marrow edema and disc space edema at C6-7. Bone marrow signal is normal throughout the thoracic spine. Cord:  Normal signal and morphology. Paraspinal and other soft tissues: Negative. Disc levels: No thoracic spinal canal stenosis. MRI LUMBAR SPINE FINDINGS Segmentation:  Standard Alignment:  There is normal Vertebrae: Modic type 2 endplate signal changes at L5-S1. No discitis-osteomyelitis or epidural collection. Conus medullaris and cauda equina: Conus extends to the L1 level. Conus and cauda equina appear normal. Paraspinal and other soft tissues: Polycystic kidneys. Disc levels: Axial images severely degraded by motion. There is no spinal canal stenosis. At L5-S1 there is Seini Lannom broad disc bulge but no neural foraminal stenosis. IMPRESSION: 1. No thoracolumbar discitis-osteomyelitis or epidural collection. 2. Mild bone marrow edema and disc space edema at C6-C7, likely degenerative. 3. Mild L5-S1 degenerative disc disease without spinal canal or neural foraminal stenosis. Electronically Signed   By: Ulyses Jarred M.D.   On: 09/30/2020 00:58   CT ABDOMEN PELVIS W CONTRAST  Result Date: 09/29/2020 CLINICAL DATA:  Sepsis abdominal pain EXAM: CT CHEST, ABDOMEN, AND PELVIS WITH CONTRAST TECHNIQUE: Multidetector CT imaging of the chest, abdomen and pelvis was performed following the standard protocol during bolus administration of intravenous contrast. CONTRAST:  167m OMNIPAQUE IOHEXOL 300 MG/ML  SOLN COMPARISON:  CT 09/27/2020, shoulder x-ray 09/17/2020 FINDINGS: CT CHEST FINDINGS Cardiovascular: Nonaneurysmal aorta. Mild aortic atherosclerosis. No aneurysm or dissection. Cardiomegaly  without significant pericardial effusion. 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 Mediastinum/Nodes: Midline trachea. No thyroid mass. No suspicious adenopathy. Esophagus within normal limits. Lungs/Pleura: No pleural effusion or pneumothorax. Cavitary mass in the  left upper lobe apex measuring 1.9 x 2.2 cm with surrounding ground-glass density, series 5, image number 43. 4 mm ground-glass nodule within the posterior left upper lobe, series 5, image number 78. small focus of consolidation and ground-glass density in the posterior right upper lobe, series 5, image number 67. Streaky atelectasis in the lower lobes. Musculoskeletal: Sternum is intact. Vertebral body heights are normal. Large calcified mass anterior to right shoulder. Similar smaller calcified mass within the left posterior paraspinal soft tissues likely due to calcinosis. CT ABDOMEN PELVIS FINDINGS Hepatobiliary: No focal liver abnormality is seen. No gallstones, gallbladder wall thickening, or biliary dilatation. Pancreas: Unremarkable. No pancreatic ductal dilatation or surrounding inflammatory changes. Spleen: Normal in size without focal abnormality. Adrenals/Urinary Tract: Adrenal glands are normal. Polycystic kidney disease. Scattered parenchymal calcifications and cysts with mild calcifications. Chunky calcification posterior lower pole left kidney measuring 2 cm. No hydronephrosis. Empty urinary bladder. Stomach/Bowel: Stomach is within normal limits. Appendix appears normal. No evidence of bowel wall thickening, distention, or inflammatory changes. Vascular/Lymphatic: Mild to moderate aortic atherosclerosis without aneurysm. No suspicious nodes. Reproductive: Prostate unremarkable.  Seminal vesicle calcification Other: Negative for free air or free fluid. Musculoskeletal: No acute osseous abnormality. Postsurgical changes of the right femur. Multiple metallic  foreign bodies in the right thigh. IMPRESSION: 1. Cavitary mass in the left upper lobe apex measuring up to 2.2 cm with surrounding ground-glass density. Findings could be secondary to cavitary infection or potential septic embolus. Kyriana Yankee cavitary neoplasm is also in the differential. There are additional small foci of consolidation and ground-glass density in the right upper lobe and small ground-glass nodule in left upper lobe, potentially infectious or inflammatory. Short interval chest CT follow-up is recommended. 2. Polycystic kidney disease. 3. No CT evidence for acute intra-abdominal or pelvic abnormality. 4. 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 Aortic Atherosclerosis (ICD10-I70.0). Electronically Signed   By: Donavan Foil M.D.   On: 09/29/2020 20:54   EEG adult  Result Date: 09/28/2020 [name], MD     09/28/2020  4:33 PM Patient Name: Patrick Anthony MRN: [id number] Epilepsy Attending: [name] Referring Physician/Provider: Dr Amie [place] Date: 09/28/2020 Duration: 30.40 mins Patient history: 50yo M with ams. EEG to evaluate for seizure. Level of alertness:  lethargic AEDs during EEG study: GBP Technical aspects: This EEG study was done with scalp electrodes positioned according to the 10-20 International system of electrode placement. Electrical activity was acquired at [place] sampling rate of '500Hz'$  and reviewed with [place] Gamero high frequency filter of '70Hz'$  and Kiano Terrien low frequency filter of '1Hz'$ . EEG data were recorded continuously and digitally stored. Description: EEG initially showed periodic discharges with triphasic morphology at 2-2.'5hz'$  were also noted. IV ativan '2mg'$  was administered after which EEG showed continuous  generalized 3 to 6 Hz theta-delta slowing. One episode of right arm tremor was noted at 1528 . Concomitant eeg before, during and after the event showed  generalized periodic discharges with triphasic morphology at 2-2.5 without definite evolution.  Hyperventilation and photic stimulation were not performed.   ABNORMALITY - Periodic discharges with triphasic morphology, generalized -Continuous slow, generalized IMPRESSION: This study initially showed generalized periodic discharges with triphasic morphology at 2-2.'5hz'$ . This eeg pattern can be on the ictal-interictal continuum and also seen with toxic-metabolic etiology. EEG improved after IV ativan which although not confirmatory is concerning for ictal nature of this pattern. There is also moderate diffuse encephalopathy,  non specific etiology. One episode of right arm tremor was noted at 1528 during which eeg showed generalized periodic discharges with triphasic morphology at 2-2.5 without definite evolution. However, focal motor seizures may not be seen on scalp eeg. Therefore, clinical correlation is recommended. Priyanka Barbra Sarks        Scheduled Meds: . calcium acetate  667 mg Oral TID WC  . calcium acetate  667 mg Oral With snacks  . Chlorhexidine Gluconate Cloth  6 each Topical Q0600  . cinacalcet  30 mg Oral Q breakfast  . cloNIDine  0.1 mg Transdermal Weekly  . metoprolol tartrate  2.5 mg Intravenous Q6H   Continuous Infusions: . vancomycin Stopped (09/29/20 1725)     LOS: 3 days    Time spent: over 30 min    Fayrene Helper, MD [facility]   To contact the attending provider between 7A-7P or the covering provider during after hours 7P-7A, please log into the web site [email or web address] and access using universal [place] password for that web site. If you do not have the password, please call the hospital operator.  09/30/2020, 3:39 PM

## 2020-09-30 NOTE — Progress Notes (Signed)
  Speech Language Pathology Treatment: Dysphagia  Patient Details Name: Patrick Anthony MRN: [id number] DOB: March 19, 1971 Today's Date: 09/30/2020 Time: [id number] SLP Time Calculation (min) (ACUTE ONLY): 20 min  Assessment / Plan / Recommendation Clinical Impression  No overt s/s of aspiration were noted during PO trials today, even when challenged with the [facility] Screen. Pt followed more one-step commands during PO trials compared to yesterday's assessment, and a few one-word verbalizations were noted. Pt currently noted to have fluctuations in mentation from lethargy to impulsivity. Given that pt is better able to follow commands, recommend purees and thin liquids from the floor stock with potential for further diet upgrade with the progression of pt's mentation.  Pt may also benefit from MBS to further assess pt's oropharyngeal swallow. SLP will continue to follow up.   HPI HPI: Pt is a 50 y.o male with sepsis likely due to pneumonia. MRI 2/22 showed scattered small acute infarcts within the bilateral cerebral  hemispheres, right basal ganglia, callosal splenium and bilateral cerebellar hemispheres. CXR from 2/21 revealed low lung volumes with streaky left basilar opacities which could  represent atelectasis or infection. PMH with significant history for ESRD on dialysis, hypertension, and CHF.      SLP Plan  Continue with current plan of care       Recommendations  Diet recommendations: Other(comment) (purees and thin liquids from floor stock) Liquids provided via: Straw Medication Administration: Crushed with puree Supervision: Staff to assist with self feeding;Full supervision/cueing for compensatory strategies Compensations: Slow rate;Small sips/bites;Minimize environmental distractions Postural Changes and/or Swallow Maneuvers: Seated upright 90 degrees                Oral Care Recommendations: Oral care QID Follow up Recommendations: Inpatient Rehab;24 hour  supervision/assistance SLP Visit Diagnosis: Dysphagia, unspecified (R13.10) Plan: Continue with current plan of care       GO               Jeanine Luz., SLP Student 09/30/2020, 8:55 AM

## 2020-09-30 NOTE — Progress Notes (Signed)
[facility]    Date of Admission:  09/27/2020   Total days of antibiotics 4           ID: Patrick Anthony is a 50 y.o. male with stroke presentation in the setting of MRSA bacteremia, concerning for endocarditis/disseminated infection Principal Problem:   Pneumonia due to infectious agent Active Problems:   Chronic diastolic CHF (congestive heart failure) (HCC)   HTN (hypertension)   Anemia in chronic kidney disease   ESRD on dialysis ([place])   Sepsis ([place])   ADPKD (autosomal dominant polycystic kidney disease)   Sepsis due to pneumonia ([place])   Lactic acidosis   Cerebral embolism with cerebral infarction   Bacteremia due to Staphylococcus aureus    Subjective: Remains solomnent but just received pain medication. He was more alert per RN report, able to say his name  Labs: unchanged WBC to 41.6K.  Chest CT showing LUL cavitary lesion, and additional nodules, concerning for septic emboli MRI of T-L spine no signs of myositis or epidural abscess Medications:  . calcium acetate  667 mg Oral TID WC  . calcium acetate  667 mg Oral With snacks  . Chlorhexidine Gluconate Cloth  6 each Topical Q0600  . cinacalcet  30 mg Oral Q breakfast  . cloNIDine  0.3 mg Oral TID  . metoprolol tartrate  2.5 mg Intravenous Q6H    Objective: BP (!) 174/107 (BP Location: Right Arm)   Pulse (!) 111   Temp 98.9 F (37.2 C) (Oral)   Resp 20   Ht 6' (1.829 m)   Wt 84.3 kg   SpO2 98%   BMI 25.21 kg/m  Physical Exam  Gen: he is sleeping, moves extremities with verbal stimulus He appears well-developed and well-nourished. No distress.  Cardiovascular: Normal rate, regular rhythm and normal heart sounds. Exam reveals no gallop and no friction rub.  No murmur heard.  Pulmonary/Chest: Effort normal and breath sounds normal. No respiratory distress. He has no wheezes.  Abdominal: Soft. Bowel sounds are normal. He exhibits no distension. There is no tenderness.   Neurological: moving extremities spontaneously Skin: Skin is warm and dry. No rash noted. No erythema.    Lab Results Recent Labs    09/29/20 1301 09/30/20 0610  WBC 41.8* 41.6*  HGB 9.9* 10.4*  HCT 29.8* 32.0*  NA 138 136  K 5.4* 4.9  CL 96* 95*  CO2 17* 22  BUN 123* 69*  CREATININE 13.25* 8.63*   Liver Panel Recent Labs    09/29/20 1301 09/30/20 0610  PROT 8.0 8.0  ALBUMIN 2.3*  2.3* 2.4*  AST 54* 38  ALT 13 15  ALKPHOS 93 92  BILITOT 1.8* 1.5*  BILIDIR 0.5*  --   IBILI 1.3*  --    No results found for: ESRSEDRATE, POCTSEDRATE  Microbiology: 2/21 blood cx MRSA 2/24 blood cx NGTD Studies/Results: CT CHEST W CONTRAST  Result Date: 09/29/2020 CLINICAL DATA:  Sepsis abdominal pain EXAM: CT CHEST, ABDOMEN, AND PELVIS WITH CONTRAST TECHNIQUE: Multidetector CT imaging of the chest, abdomen and pelvis was performed following the standard protocol during bolus administration of intravenous contrast. CONTRAST:  171m OMNIPAQUE IOHEXOL 300 MG/ML  SOLN COMPARISON:  CT 09/27/2020, shoulder x-ray 09/17/2020 FINDINGS: CT CHEST FINDINGS Cardiovascular: Nonaneurysmal aorta. Mild aortic atherosclerosis. No aneurysm or dissection. Cardiomegaly without significant pericardial effusion. 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 Mediastinum/Nodes: Midline trachea. No thyroid mass. No suspicious adenopathy. Esophagus within normal  limits. Lungs/Pleura: No pleural effusion or pneumothorax. Cavitary mass in the left upper lobe apex measuring 1.9 x 2.2 cm with surrounding ground-glass density, series 5, image number 43. 4 mm ground-glass nodule within the posterior left upper lobe, series 5, image number 78. small focus of consolidation and ground-glass density in the posterior right upper lobe, series 5, image number 67. Streaky atelectasis in the lower lobes. Musculoskeletal: Sternum is intact. Vertebral body heights  are normal. Large calcified mass anterior to right shoulder. Similar smaller calcified mass within the left posterior paraspinal soft tissues likely due to calcinosis. CT ABDOMEN PELVIS FINDINGS Hepatobiliary: No focal liver abnormality is seen. No gallstones, gallbladder wall thickening, or biliary dilatation. Pancreas: Unremarkable. No pancreatic ductal dilatation or surrounding inflammatory changes. Spleen: Normal in size without focal abnormality. Adrenals/Urinary Tract: Adrenal glands are normal. Polycystic kidney disease. Scattered parenchymal calcifications and cysts with mild calcifications. Chunky calcification posterior lower pole left kidney measuring 2 cm. No hydronephrosis. Empty urinary bladder. Stomach/Bowel: Stomach is within normal limits. Appendix appears normal. No evidence of bowel wall thickening, distention, or inflammatory changes. Vascular/Lymphatic: Mild to moderate aortic atherosclerosis without aneurysm. No suspicious nodes. Reproductive: Prostate unremarkable.  Seminal vesicle calcification Other: Negative for free air or free fluid. Musculoskeletal: No acute osseous abnormality. Postsurgical changes of the right femur. Multiple metallic foreign bodies in the right thigh. IMPRESSION: 1. Cavitary mass in the left upper lobe apex measuring up to 2.2 cm with surrounding ground-glass density. Findings could be secondary to cavitary infection or potential septic embolus. A cavitary neoplasm is also in the differential. There are additional small foci of consolidation and ground-glass density in the right upper lobe and small ground-glass nodule in left upper lobe, potentially infectious or inflammatory. Short interval chest CT follow-up is recommended. 2. Polycystic kidney disease. 3. No CT evidence for acute intra-abdominal or pelvic abnormality. 4. Bulky calcified mass anterior to right shoulder with smaller left paraspinal soft tissue calcification probably due to calcinosis related to  chronic kidney disease. Aortic Atherosclerosis (ICD10-I70.0). Electronically Signed   By: Donavan Foil M.D.   On: 09/29/2020 20:54   MR THORACIC SPINE WO CONTRAST  Result Date: 09/30/2020 CLINICAL DATA:  Bacteremia with generalized weakness EXAM: MRI THORACIC AND LUMBAR SPINE WITHOUT CONTRAST TECHNIQUE: Multiplanar and multiecho pulse sequences of the thoracic and lumbar spine were obtained without intravenous contrast. COMPARISON:  None. FINDINGS: MRI THORACIC SPINE FINDINGS Alignment:  Physiologic. Vertebrae: There is bone marrow edema and disc space edema at C6-7. Bone marrow signal is normal throughout the thoracic spine. Cord:  Normal signal and morphology. Paraspinal and other soft tissues: Negative. Disc levels: No thoracic spinal canal stenosis. MRI LUMBAR SPINE FINDINGS Segmentation:  Standard Alignment:  There is normal Vertebrae: Modic type 2 endplate signal changes at L5-S1. No discitis-osteomyelitis or epidural collection. Conus medullaris and cauda equina: Conus extends to the L1 level. Conus and cauda equina appear normal. Paraspinal and other soft tissues: Polycystic kidneys. Disc levels: Axial images severely degraded by motion. There is no spinal canal stenosis. At L5-S1 there is a broad disc bulge but no neural foraminal stenosis. IMPRESSION: 1. No thoracolumbar discitis-osteomyelitis or epidural collection. 2. Mild bone marrow edema and disc space edema at C6-C7, likely degenerative. 3. Mild L5-S1 degenerative disc disease without spinal canal or neural foraminal stenosis. Electronically Signed   By: Ulyses Jarred M.D.   On: 09/30/2020 00:58   MR LUMBAR SPINE WO CONTRAST  Result Date: 09/30/2020 CLINICAL DATA:  Bacteremia with generalized weakness EXAM: MRI THORACIC  AND LUMBAR SPINE WITHOUT CONTRAST TECHNIQUE: Multiplanar and multiecho pulse sequences of the thoracic and lumbar spine were obtained without intravenous contrast. COMPARISON:  None. FINDINGS: MRI THORACIC SPINE FINDINGS  Alignment:  Physiologic. Vertebrae: There is bone marrow edema and disc space edema at C6-7. Bone marrow signal is normal throughout the thoracic spine. Cord:  Normal signal and morphology. Paraspinal and other soft tissues: Negative. Disc levels: No thoracic spinal canal stenosis. MRI LUMBAR SPINE FINDINGS Segmentation:  Standard Alignment:  There is normal Vertebrae: Modic type 2 endplate signal changes at L5-S1. No discitis-osteomyelitis or epidural collection. Conus medullaris and cauda equina: Conus extends to the L1 level. Conus and cauda equina appear normal. Paraspinal and other soft tissues: Polycystic kidneys. Disc levels: Axial images severely degraded by motion. There is no spinal canal stenosis. At L5-S1 there is a broad disc bulge but no neural foraminal stenosis. IMPRESSION: 1. No thoracolumbar discitis-osteomyelitis or epidural collection. 2. Mild bone marrow edema and disc space edema at C6-C7, likely degenerative. 3. Mild L5-S1 degenerative disc disease without spinal canal or neural foraminal stenosis. Electronically Signed   By: Ulyses Jarred M.D.   On: 09/30/2020 00:58   CT ABDOMEN PELVIS W CONTRAST  Result Date: 09/29/2020 CLINICAL DATA:  Sepsis abdominal pain EXAM: CT CHEST, ABDOMEN, AND PELVIS WITH CONTRAST TECHNIQUE: Multidetector CT imaging of the chest, abdomen and pelvis was performed following the standard protocol during bolus administration of intravenous contrast. CONTRAST:  165m OMNIPAQUE IOHEXOL 300 MG/ML  SOLN COMPARISON:  CT 09/27/2020, shoulder x-ray 09/17/2020 FINDINGS: CT CHEST FINDINGS Cardiovascular: Nonaneurysmal aorta. Mild aortic atherosclerosis. No aneurysm or dissection. Cardiomegaly without significant pericardial effusion. 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 Mediastinum/Nodes: Midline trachea. No thyroid mass. No suspicious adenopathy. Esophagus within normal limits. Lungs/Pleura:  No pleural effusion or pneumothorax. Cavitary mass in the left upper lobe apex measuring 1.9 x 2.2 cm with surrounding ground-glass density, series 5, image number 43. 4 mm ground-glass nodule within the posterior left upper lobe, series 5, image number 78. small focus of consolidation and ground-glass density in the posterior right upper lobe, series 5, image number 67. Streaky atelectasis in the lower lobes. Musculoskeletal: Sternum is intact. Vertebral body heights are normal. Large calcified mass anterior to right shoulder. Similar smaller calcified mass within the left posterior paraspinal soft tissues likely due to calcinosis. CT ABDOMEN PELVIS FINDINGS Hepatobiliary: No focal liver abnormality is seen. No gallstones, gallbladder wall thickening, or biliary dilatation. Pancreas: Unremarkable. No pancreatic ductal dilatation or surrounding inflammatory changes. Spleen: Normal in size without focal abnormality. Adrenals/Urinary Tract: Adrenal glands are normal. Polycystic kidney disease. Scattered parenchymal calcifications and cysts with mild calcifications. Chunky calcification posterior lower pole left kidney measuring 2 cm. No hydronephrosis. Empty urinary bladder. Stomach/Bowel: Stomach is within normal limits. Appendix appears normal. No evidence of bowel wall thickening, distention, or inflammatory changes. Vascular/Lymphatic: Mild to moderate aortic atherosclerosis without aneurysm. No suspicious nodes. Reproductive: Prostate unremarkable.  Seminal vesicle calcification Other: Negative for free air or free fluid. Musculoskeletal: No acute osseous abnormality. Postsurgical changes of the right femur. Multiple metallic foreign bodies in the right thigh. IMPRESSION: 1. Cavitary mass in the left upper lobe apex measuring up to 2.2 cm with surrounding ground-glass density. Findings could be secondary to cavitary infection or potential septic embolus. A cavitary neoplasm is also in the differential. There are  additional small foci of consolidation and ground-glass density in the right upper lobe and small ground-glass nodule in left upper  lobe, potentially infectious or inflammatory. Short interval chest CT follow-up is recommended. 2. Polycystic kidney disease. 3. No CT evidence for acute intra-abdominal or pelvic abnormality. 4. 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 Aortic Atherosclerosis (ICD10-I70.0). Electronically Signed   By: Donavan Foil M.D.   On: 09/29/2020 20:54   EEG adult  Result Date: 09/28/2020 [name], MD     09/28/2020  4:33 PM Patient Name: Patrick Anthony MRN: [id number] Epilepsy Attending: [name] Referring Physician/Provider: Dr Amie [place] Date: 09/28/2020 Duration: 30.40 mins Patient history: 50yo M with ams. EEG to evaluate for seizure. Level of alertness:  lethargic AEDs during EEG study: GBP Technical aspects: This EEG study was done with scalp electrodes positioned according to the 10-20 International system of electrode placement. Electrical activity was acquired at a sampling rate of '500Hz'$  and reviewed with a high frequency filter of '70Hz'$  and a low frequency filter of '1Hz'$ . EEG data were recorded continuously and digitally stored. Description: EEG initially showed periodic discharges with triphasic morphology at 2-2.'5hz'$  were also noted. IV ativan '2mg'$  was administered after which EEG showed continuous  generalized 3 to 6 Hz theta-delta slowing. One episode of right arm tremor was noted at 1528 . Concomitant eeg before, during and after the event showed generalized periodic discharges with triphasic morphology at 2-2.5 without definite evolution.  Hyperventilation and photic stimulation were not performed.   ABNORMALITY - Periodic discharges with triphasic morphology, generalized -Continuous slow, generalized IMPRESSION: This study initially showed  generalized periodic discharges with triphasic morphology at 2-2.'5hz'$ . This eeg pattern can be on the ictal-interictal continuum and also seen with toxic-metabolic etiology. EEG improved after IV ativan which although not confirmatory is concerning for ictal nature of this pattern. There is also moderate diffuse encephalopathy, non specific etiology. One episode of right arm tremor was noted at 1528 during which eeg showed generalized periodic discharges with triphasic morphology at 2-2.5 without definite evolution. However, focal motor seizures may not be seen on scalp eeg. Therefore, clinical correlation is recommended. [name]   ECHOCARDIOGRAM COMPLETE BUBBLE STUDY  Result Date: 09/28/2020    ECHOCARDIOGRAM REPORT   Patient Name:   JEFFARY CLOONAN Date of Exam: 09/28/2020 Medical Rec #:  [id number]      Height:       72.0 in Accession #:    [id number]     Weight:       185.0 lb Date of Birth:  01/09/1971      BSA:          2.061 m Patient Age:    73 years       BP:           134/92 mmHg Patient Gender: M              HR:           101 bpm. Exam Location:  Inpatient Procedure: 2D Echo, Cardiac Doppler, Color Doppler and Saline Contrast Bubble            Study Indications:    CVA  History:        Patient has prior history of Echocardiogram examinations, most                 recent 10/23/2019. CHF, Signs/Symptoms:Fever; Risk                 Factors:Hypertension. ESRD, sepsis, PNA, drug abuse.  Sonographer:    Dustin Flock Referring Phys: [id number] [place]  1. Left ventricular ejection fraction, by estimation, is 55 to 60%. The left ventricle has normal function. The left ventricle has no regional wall motion abnormalities. There is moderate concentric left ventricular hypertrophy. Left ventricular diastolic parameters are consistent with Grade II diastolic dysfunction (pseudonormalization).  2. Right ventricular systolic function is normal. The right ventricular size is normal. There  is normal pulmonary artery systolic pressure.  3. Left atrial size was moderately dilated.  4. Right atrial size was mild to moderately dilated.  5. The mitral valve is normal in structure. Mild to moderate mitral valve regurgitation. No evidence of mitral stenosis. Moderate to severe mitral annular calcification.  6. The aortic valve is tricuspid. Aortic valve regurgitation is trivial. No aortic stenosis is present.  7. The inferior vena cava is normal in size with greater than 50% respiratory variability, suggesting right atrial pressure of 3 mmHg.  8. Agitated saline contrast bubble study was negative, with no evidence of any interatrial shunt. Conclusion(s)/Recommendation(s): No intracardiac source of embolism detected on this transthoracic study. A transesophageal echocardiogram is recommended to exclude cardiac source of embolism if clinically indicated. FINDINGS  Left Ventricle: Left ventricular ejection fraction, by estimation, is 55 to 60%. The left ventricle has normal function. The left ventricle has no regional wall motion abnormalities. The left ventricular internal cavity size was normal in size. There is  moderate concentric left ventricular hypertrophy. Left ventricular diastolic parameters are consistent with Grade II diastolic dysfunction (pseudonormalization). Right Ventricle: The right ventricular size is normal. Right vetricular wall thickness was not well visualized. Right ventricular systolic function is normal. There is normal pulmonary artery systolic pressure. The tricuspid regurgitant velocity is 2.51 m/s, and with an assumed right atrial pressure of 3 mmHg, the estimated right ventricular systolic pressure is [id number] mmHg. Left Atrium: Left atrial size was moderately dilated. Right Atrium: Right atrial size was mild to moderately dilated. Pericardium: Trivial pericardial effusion is present. Mitral Valve: The mitral valve is normal in structure. There is mild thickening of the mitral valve  leaflet(s). There is moderate calcification of the mitral valve leaflet(s). Moderate to severe mitral annular calcification. Mild to moderate mitral valve  regurgitation. No evidence of mitral valve stenosis. Tricuspid Valve: The tricuspid valve is normal in structure. Tricuspid valve regurgitation is trivial. No evidence of tricuspid stenosis. Aortic Valve: The aortic valve is tricuspid. Aortic valve regurgitation is trivial. No aortic stenosis is present. Pulmonic Valve: The pulmonic valve was grossly normal. Pulmonic valve regurgitation is trivial. No evidence of pulmonic stenosis. Aorta: The aortic root, ascending aorta, aortic arch and descending aorta are all structurally normal, with no evidence of dilitation or obstruction. Venous: The inferior vena cava is normal in size with greater than 50% respiratory variability, suggesting right atrial pressure of 3 mmHg. IAS/Shunts: The atrial septum is grossly normal. Agitated saline contrast was given intravenously to evaluate for intracardiac shunting. Agitated saline contrast bubble study was negative, with no evidence of any interatrial shunt.  LEFT VENTRICLE PLAX 2D LVIDd:         4.80 cm  Diastology LVIDs:         3.20 cm  LV e' medial:    11.10 cm/s LV PW:         1.50 cm  LV E/e' medial:  12.2 LV IVS:        1.60 cm  LV e' lateral:   6.74 cm/s LVOT diam:  2.20 cm  LV E/e' lateral: 20.0 LV SV:         49 LV SV Index:   24 LVOT Area:     3.80 cm  RIGHT VENTRICLE RV Basal diam:  2.60 cm RV S prime:     19.00 cm/s TAPSE (M-mode): 3.6 cm LEFT ATRIUM             Index       RIGHT ATRIUM           Index LA diam:        4.50 cm 2.18 cm/m  RA Area:     20.20 cm LA Vol (A2C):   72.7 ml 35.27 ml/m RA Volume:   56.10 ml  27.22 ml/m LA Vol (A4C):   98.8 ml 47.94 ml/m LA Biplane Vol: 87.1 ml 42.26 ml/m  AORTIC VALVE LVOT Vmax:   113.00 cm/s LVOT Vmean:  71.300 cm/s LVOT VTI:    0.128 m  AORTA Ao Root diam: 3.60 cm MITRAL VALVE                TRICUSPID VALVE MV  Area (PHT): 5.13 cm     TR Peak grad:   25.2 mmHg MV Decel Time: 148 msec     TR Vmax:        251.00 cm/s MV E velocity: 135.00 cm/s MV A velocity: 147.00 cm/s  SHUNTS MV E/A ratio:  0.92         Systemic VTI:  0.13 m                             Systemic Diam: 2.20 cm Buford Dresser MD Electronically signed by Buford Dresser MD Signature Date/Time: 09/28/2020/6:58:41 PM    Final      Assessment/Plan: 50yo M with polycystic kidney disease, ESRD on HD found to have stroke with MRSA bacteremia, and chest CT with sign c/w pulmonary septic emboli, 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 Thought to be leukamoid reaction/stress reaction. No new source of infection  Will place on contact isolation  Discussed recs with dr Florene Glen.  [facility] Cell: [phone] Pager: [phone]  09/30/2020, 1:47 PM

## 2020-09-30 NOTE — Progress Notes (Signed)
[facility] Progress Note  Subjective:  Pt remains somnolent, not responding to questions, eyes closed  Vitals:   09/30/20 0419 09/30/20 0700 09/30/20 0900 09/30/20 1200  BP: (!) 176/105 (!) 170/121 (!) 153/101 (!) 174/107  Pulse: (!) 105 99 99 (!) 111  Resp: (!) '21 20 18 20  '$ Temp: 98.6 F (37 C) 98.6 F (37 C) 98 F (36.7 C) 98.9 F (37.2 C)  TempSrc: Oral Axillary Axillary Oral  SpO2: 99% 100% 100% 98%  Weight:      Height:        Exam:   Pt asleep, nonverbal, moves about now and then  no jvd  Chest cta bilat  Cor reg no RG  Abd soft ntnd no ascites   Ext no LE edema   Alert, NF, ox3   LUA AVG +bruit     OP HD: [facility]   4h  350/500  82.5kg  2/2.5 bath  P1  Hep 5500  LUA AVG   Aranesp 63mg q2wks - last 09/27/20   Hectorol 329m IV qHD    Assessment/ Plan: 1. MRSA bacteremia: possible endocarditis w/ MRI + for embolic CVA's. Seen by cardiology, will need to be more stable to get TEE. High fevers down, WBC remains > 40K. Has AVG LUA w/o gross signs of infection. Getting IV vanc. ID following.  2. Acute CVA - multiple by MRI, prob embolic 3. AMS - pt encephalopathic, hopefully will improve w/ Rx of infection 4. ESRD - usual HD MWF.  Next HD Friday.   5. Hypertension/volume  - Blood pressure chronically hard to control. Does not appear grossly volume overloaded on exam. 2kg up, UF 2- 3 same w/ HD tomorrow. Keep SBP > 120.  6. Anemia of CKD - Hgb 9.3.  ESA recently dosed.  7. Secondary Hyperparathyroidism -  Ca in goal.  Will check phos.  Continue VDRA, sensipar and binders once eating.  8. Nutrition - Renal diet w/fluid restrictions once advanced.  9. Hx Hep C /liver cirrhosis   Patrick Anthony 09/30/2020, 12:58 PM   Recent Labs  Lab 09/29/20 1301 09/30/20 0610  K 5.4* 4.9  BUN 123* 69*  CREATININE 13.25* 8.63*  CALCIUM 8.4* 8.6*  PHOS 7.8* 7.7*  HGB 9.9* 10.4*   Inpatient medications: . calcium acetate  667 mg Oral TID WC  . calcium acetate   667 mg Oral With snacks  . Chlorhexidine Gluconate Cloth  6 each Topical Q0600  . cinacalcet  30 mg Oral Q breakfast  . cloNIDine  0.3 mg Oral TID  . metoprolol tartrate  2.5 mg Intravenous Q6H   . vancomycin Stopped (09/29/20 1725)   acetaminophen **OR** acetaminophen (TYLENOL) oral liquid 160 mg/5 mL **OR** acetaminophen, albuterol, HYDROmorphone (DILAUDID) injection, ondansetron **OR** ondansetron (ZOFRAN) IV, oxyCODONE, polyethylene glycol

## 2020-10-01 ENCOUNTER — Other Ambulatory Visit: Payer: Self-pay | Admitting: Cardiology

## 2020-10-01 DIAGNOSIS — B9561 Methicillin susceptible Staphylococcus aureus infection as the cause of diseases classified elsewhere: Secondary | ICD-10-CM | POA: Diagnosis not present

## 2020-10-01 DIAGNOSIS — I6340 Cerebral infarction due to embolism of unspecified cerebral artery: Secondary | ICD-10-CM | POA: Diagnosis not present

## 2020-10-01 DIAGNOSIS — R531 Weakness: Secondary | ICD-10-CM | POA: Diagnosis not present

## 2020-10-01 DIAGNOSIS — D631 Anemia in chronic kidney disease: Secondary | ICD-10-CM | POA: Diagnosis not present

## 2020-10-01 DIAGNOSIS — A419 Sepsis, unspecified organism: Secondary | ICD-10-CM | POA: Diagnosis not present

## 2020-10-01 DIAGNOSIS — I639 Cerebral infarction, unspecified: Secondary | ICD-10-CM | POA: Diagnosis not present

## 2020-10-01 DIAGNOSIS — J189 Pneumonia, unspecified organism: Secondary | ICD-10-CM | POA: Diagnosis not present

## 2020-10-01 DIAGNOSIS — I132 Hypertensive heart and chronic kidney disease with heart failure and with stage 5 chronic kidney disease, or end stage renal disease: Secondary | ICD-10-CM | POA: Diagnosis not present

## 2020-10-01 DIAGNOSIS — I5032 Chronic diastolic (congestive) heart failure: Secondary | ICD-10-CM | POA: Diagnosis not present

## 2020-10-01 DIAGNOSIS — Z992 Dependence on renal dialysis: Secondary | ICD-10-CM | POA: Diagnosis not present

## 2020-10-01 DIAGNOSIS — R0602 Shortness of breath: Secondary | ICD-10-CM | POA: Diagnosis not present

## 2020-10-01 DIAGNOSIS — R7881 Bacteremia: Secondary | ICD-10-CM | POA: Diagnosis not present

## 2020-10-01 DIAGNOSIS — N186 End stage renal disease: Secondary | ICD-10-CM | POA: Diagnosis not present

## 2020-10-01 LAB — CK: Total CK: 53 U/L (ref 49–397)

## 2020-10-01 LAB — CBC WITH DIFFERENTIAL/PLATELET
Abs Immature Granulocytes: 1.9 10*3/uL — ABNORMAL HIGH (ref 0.00–0.07)
Basophils Absolute: 0 10*3/uL (ref 0.0–0.1)
Basophils Relative: 0 %
Blasts: 1 %
Eosinophils Absolute: 0 10*3/uL (ref 0.0–0.5)
Eosinophils Relative: 0 %
HCT: 27.5 % — ABNORMAL LOW (ref 39.0–52.0)
Hemoglobin: 9.1 g/dL — ABNORMAL LOW (ref 13.0–17.0)
Lymphocytes Relative: 3 %
Lymphs Abs: 1.2 10*3/uL (ref 0.7–4.0)
MCH: 26.7 pg (ref 26.0–34.0)
MCHC: 33.1 g/dL (ref 30.0–36.0)
MCV: 80.6 fL (ref 80.0–100.0)
Monocytes Absolute: 3.5 10*3/uL — ABNORMAL HIGH (ref 0.1–1.0)
Monocytes Relative: 9 %
Myelocytes: 3 %
Neutro Abs: 31.6 10*3/uL — ABNORMAL HIGH (ref 1.7–7.7)
Neutrophils Relative %: 82 %
Platelets: 191 10*3/uL (ref 150–400)
Promyelocytes Relative: 2 %
RBC: 3.41 MIL/uL — ABNORMAL LOW (ref 4.22–5.81)
RDW: 15.7 % — ABNORMAL HIGH (ref 11.5–15.5)
WBC: 38.5 10*3/uL — ABNORMAL HIGH (ref 4.0–10.5)
nRBC: 0.3 % — ABNORMAL HIGH (ref 0.0–0.2)

## 2020-10-01 LAB — COMPREHENSIVE METABOLIC PANEL
ALT: 14 U/L (ref 0–44)
AST: 27 U/L (ref 15–41)
Albumin: 2.4 g/dL — ABNORMAL LOW (ref 3.5–5.0)
Alkaline Phosphatase: 80 U/L (ref 38–126)
Anion gap: 21 — ABNORMAL HIGH (ref 5–15)
BUN: 103 mg/dL — ABNORMAL HIGH (ref 6–20)
CO2: 21 mmol/L — ABNORMAL LOW (ref 22–32)
Calcium: 8.5 mg/dL — ABNORMAL LOW (ref 8.9–10.3)
Chloride: 96 mmol/L — ABNORMAL LOW (ref 98–111)
Creatinine, Ser: 11.05 mg/dL — ABNORMAL HIGH (ref 0.61–1.24)
GFR, Estimated: 5 mL/min — ABNORMAL LOW (ref >60)
Glucose, Bld: 94 mg/dL (ref 70–99)
Potassium: 4.6 mmol/L (ref 3.5–5.1)
Sodium: 138 mmol/L (ref 135–145)
Total Bilirubin: 1.6 mg/dL — ABNORMAL HIGH (ref 0.3–1.2)
Total Protein: 7.7 g/dL (ref 6.5–8.1)

## 2020-10-01 LAB — CULTURE, BLOOD (ROUTINE X 2)

## 2020-10-01 LAB — PROTIME-INR
INR: 1.4 — ABNORMAL HIGH (ref 0.8–1.2)
Prothrombin Time: 16.5 seconds — ABNORMAL HIGH (ref 11.4–15.2)

## 2020-10-01 LAB — MAGNESIUM: Magnesium: 3.1 mg/dL — ABNORMAL HIGH (ref 1.7–2.4)

## 2020-10-01 LAB — PHOSPHORUS: Phosphorus: 7.5 mg/dL — ABNORMAL HIGH (ref 2.5–4.6)

## 2020-10-01 MED ORDER — HYDRALAZINE HCL 50 MG PO TABS
50.0000 mg | ORAL_TABLET | Freq: Two times a day (BID) | ORAL | Status: DC
  Administered 2020-10-01 – 2020-10-12 (×22): 50 mg via ORAL
  Filled 2020-10-01 (×23): qty 1

## 2020-10-01 MED ORDER — AMLODIPINE BESYLATE 10 MG PO TABS
10.0000 mg | ORAL_TABLET | Freq: Every day | ORAL | Status: DC
  Administered 2020-10-01 – 2020-10-12 (×11): 10 mg via ORAL
  Filled 2020-10-01 (×13): qty 1

## 2020-10-01 MED ORDER — HYDRALAZINE HCL 50 MG PO TABS: 100.0000 mg | ORAL_TABLET | Freq: Two times a day (BID) | ORAL | Status: DC

## 2020-10-01 MED ORDER — SODIUM CHLORIDE 0.9 % IV SOLN
840.0000 mg | INTRAVENOUS | Status: DC
  Filled 2020-10-01: qty 16.8

## 2020-10-01 MED ORDER — VANCOMYCIN HCL IN DEXTROSE 1-5 GM/200ML-% IV SOLN
1000.0000 mg | INTRAVENOUS | Status: DC
  Administered 2020-10-01: 1000 mg via INTRAVENOUS
  Filled 2020-10-01 (×3): qty 200

## 2020-10-01 MED ORDER — METOPROLOL TARTRATE 50 MG PO TABS
75.0000 mg | ORAL_TABLET | Freq: Two times a day (BID) | ORAL | Status: DC
  Administered 2020-10-01 – 2020-10-13 (×24): 75 mg via ORAL
  Filled 2020-10-01 (×26): qty 1

## 2020-10-01 NOTE — Progress Notes (Signed)
STROKE TEAM PROGRESS NOTE   INTERVAL HISTORY No family at bedside. Pt seen at HD unit. Pt still mildly sleepy but seems improving, more awake then yesterday, orientated to place, hospital name, month, but not to year or age. Still has asterixis. Moving all extremities. Pending TEE. Platelet normalized but INR still 1.4.   Vitals:   10/01/20 1330 10/01/20 1400 10/01/20 1430 10/01/20 1500  BP: (!) 163/115 (!) 140/99 (!) 139/95 (!) 130/58  Pulse: (!) 111  (!) 109 (!) 112  Resp: (!) '22 20 17 '$ (!) 24  Temp:      TempSrc:      SpO2:  99% 100% 97%  Weight:      Height:       CBC:  Recent Labs  Lab 09/30/20 0610 10/01/20 0818  WBC 41.6* 38.5*  NEUTROABS 37.0* 31.6*  HGB 10.4* 9.1*  HCT 32.0* 27.5*  MCV 81.6 80.6  PLT 149* 99991111   Basic Metabolic Panel:  Recent Labs  Lab 09/30/20 0610 10/01/20 0818  NA 136 138  K 4.9 4.6  CL 95* 96*  CO2 22 21*  GLUCOSE 120* 94  BUN 69* 103*  CREATININE 8.63* 11.05*  CALCIUM 8.6* 8.5*  MG 2.6* 3.1*  PHOS 7.7* 7.5*   Lipid Panel:  Recent Labs  Lab 09/29/20 0435  CHOL 126  TRIG 342*  HDL <10*  CHOLHDL NOT CALCULATED  VLDL 68*  LDLCALC NOT CALCULATED   HgbA1c:  Recent Labs  Lab 09/29/20 0435  HGBA1C 5.5   Urine Drug Screen: No results for input(s): LABOPIA, COCAINSCRNUR, LABBENZ, AMPHETMU, THCU, LABBARB in the last 168 hours.  Alcohol Level No results for input(s): ETH in the last 168 hours.  MRA Neck Result date 09/28/2020 The visualized common carotid, internal carotid and vertebral arteries patent within the neck without appreciable stenosis.  MRA head WO contrast: Result date 09/28/2020 No intracranial large vessel occlusion or proximal high-grade arterial stenosis.  MRI Brain WO contrast Result date 09/28/2020 1. 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 Multiple vascular territories are involved and  findings are highly suspicious for an embolic process. 2. 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 A few scattered supratentorial chronic microhemorrhages are also present. 3. Mild generalized atrophy of the brain.  CT Head WO contrast Result date 09/28/2020 IMPRESSION: 1. 10 mm infarct within the medial left cerebellar hemisphere, which may be acute. Consider brain MRI for further evaluation. 2. No acute intracranial hemorrhage or acute demarcated cortical infarct.  EEG adult Result Date: 09/28/2020 IMPRESSION: This study initially showed generalized periodic discharges with triphasic morphology at 2-2.'5hz'$ . This eeg pattern can be on the ictal-interictal continuum and also seen with toxic-metabolic etiology. EEG improved after IV ativan which although not confirmatory is concerning for ictal nature of this pattern. There is also moderate diffuse encephalopathy, non specific etiology. One episode of right arm tremor was noted at 1528 during which eeg showed generalized periodic discharges with triphasic morphology at 2-2.5 without definite evolution. However, focal motor seizures may not be seen on scalp eeg. Therefore, clinical correlation is recommended. [name]   ECHOCARDIOGRAM COMPLETE BUBBLE STUDY Result Date: 09/28/2020    1. Left ventricular ejection fraction, by estimation, is 55 to 60%. The left ventricle has normal function. The left ventricle has no regional wall motion abnormalities. There is moderate concentric left ventricular hypertrophy. Left ventricular diastolic parameters are consistent with Grade II diastolic  dysfunction (pseudonormalization).  2. Right ventricular systolic function is normal. The right ventricular size is normal. There is normal pulmonary artery systolic pressure.  3. Left atrial size was moderately dilated.   4. Right atrial size was mild to moderately dilated.   5. The mitral valve is normal in  structure. Mild to moderate mitral valve regurgitation. No evidence of mitral stenosis. Moderate to severe mitral annular calcification.   6. The aortic valve is tricuspid. Aortic valve regurgitation is trivial. No aortic stenosis is present.   7. The inferior vena cava is normal in size with greater than 50% respiratory variability, suggesting right atrial pressure of 3 mmHg.   8. Agitated saline contrast bubble study was negative, with no evidence of any interatrial shunt.   PHYSICAL EXAM  Temp:  [98.2 F (36.8 C)-99.2 F (37.3 C)] 98.2 F (36.8 C) (02/25 1200) Pulse Rate:  [90-113] 112 (02/25 1500) Resp:  [17-24] 24 (02/25 1500) BP: (130-185)/(58-120) 130/58 (02/25 1500) SpO2:  [97 %-100 %] 97 % (02/25 1500) Weight:  [80.1 kg] 80.1 kg (02/25 1200)  General - Well nourished, well developed, mildly sleepy.  Ophthalmologic - fundi not visualized due to noncooperation.  Cardiovascular - Regular rhythm and tachycardia.  Neuro - mildly sleepy, but more awake alert than yesterday, able to answer questions and following commands but without stimulation, he will fall back to sleep. Orientated to self, place, hospital name and month, but not to year or age. Paucity of speech, but able to speak short sentences, able to name 2/4, following simple commands. No gaze palsy, track bilaterally, blinking to visual threat bilaterally, PERRL. No facial droop. Tongue midline. Bilateral UEs proximal 3/5 and distal 3+/5. Bilaterally LEs 3/5 at least. Sensation subjectively symmetrical and coordination intact but slow needed frequent prompt, gait not tested.   ASSESSMENT/PLAN [name] is a 50 y.o. male with history of end-stage renal disease due to adult polycystic kidney disease on hemodialysis Monday, Wednesday, Friday with left upper extremity AV graft, hypertension, obstructive sleep apnea, chronic heart failure with preserved ejection fraction, hepatitis C with liver cirrhosis, and history of  alcohol abuse who presented to the hospital 09/27/20 for evaluation of generalized weakness, lethargy, tachycardia, fever, and chills. He was found to be septic with a temperature of 103.2, a heart rate in the 140's, leukocytosis, and an elevated lactic acidosis with lactic acid of 3.5; presumably secondary to a left lower lobe pneumonia identified on x-ray.  On 09/28/2020 at [facility] he developed word finding difficulty and aphasia.  He was also noted to have tremor of the upper extremities, subsequently a code stroke was activated and he was taken to CT for imaging.  No TPA was given as he was outside of time window (LKN 0100).  He was not a thrombectomy candidate as no LVO seen on imaging.      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    MRI head: Scattered small acute infarcts within the bilateral cerebral hemispheres, right basal ganglia, callosal splenium and bilateral cerebellar hemispheres  MRA head and neck - negative  2D Echo EF 55-60%, Grade II diastolic dysfunction, moderate LVH  Recommend TEE to rule out infective endocardiditis  Direct LDL - 25.4  Triglycerides 342  HgbA1c 5.5  VTE prophylaxis - Heparin with dialysis  NPO: speech therapy is following  No antithrombotic prior  to admission, now on No antithrombotic due to the possibility of endocarditis  Therapy recommendations:  SNF  Disposition: pending  Tremor vs. Myoclonus  Asterixis   EEG: moderate diffuse encephalopathy, non specific etiology.  Multiple episodes of right arm tremor were noted without concomitant EEG change and are most likely not epileptic.   Likely due to encephalopathy    Ammonia 27  Significant asterixis, consistent with encephalopathy  Continue to treat underlying disease  ? MRSA bactermia ?  Bacterial meningitis  Fever  leukocytosis  Tmax 100.6 -> afebrile  WBC 23.2->29.6->41.8->41.6->38.5  Highly suspicious for endocarditis  Can not rule out meningitis - however not good candidate for LP given elevated INR and agitation not cooperative and mental status improving - will hold off LP at this time  Will need TEE   Cardiology following   ID following on board - MRI T/L spine no source of infection - Vancomycin started 2/23 (6 weeks) - recommend CNS converage  ESRD on HD  Nephrology on board  HD toay  Hypertension  Home meds:  Amlodipine '10mg'$  daily, clonodine 0.'3mg'$  tid,  Stable  On norvasc 10, clonidine 0.1 patch, hydralazine 50 bid, metoprolol '75mg'$  bid  Gradually normalize BP in 2-3 days . BP goal normotensive  Hyperlipidemia  Home meds:  none  Direct LDL - 25.4, goal < 70  TG 342  No statin needed at this time given low LDL  Tobacco Abuse  Current smoker  Will need smoking cessation counseling once appropriate  Nicotine patch  Other Stroke Risk Factors  ETOH use  Coronary artery disease  Obstructive sleep apnea, unclear if he is on CPAP at home  Congestive heart failure diastolic CHF (0000000)  Other Active problems  Hepatitis C/ Cirrhosis of liver   Anemia of chronic disease - Hgb - 9.3->9.9->10.4->9.1  INR 1.4->1.4  Hospital day # 4    Rosalin Hawking, MD PhD Stroke Neurology 10/01/2020 3:22 PM   To contact Stroke Continuity provider, please refer to [email or web address] After hours, contact General Neurology

## 2020-10-01 NOTE — Plan of Care (Addendum)
Pt has on/off confusion to time and situation, forgetful at times.   Problem: Education: Goal: Knowledge of disease or condition will improve Outcome: Progressing Goal: Knowledge of secondary prevention will improve Outcome: Progressing Goal: Knowledge of patient specific risk factors addressed and post discharge goals established will improve Outcome: Progressing Goal: Individualized Educational Video(s) Outcome: Progressing   Problem: Self-Care: Goal: Ability to participate in self-care as condition permits will improve Outcome: Progressing Goal: Ability to communicate needs accurately will improve Outcome: Progressing   Problem: Nutrition: Goal: Risk of aspiration will decrease Outcome: Progressing Goal: Dietary intake will improve Outcome: Progressing   Problem: Ischemic Stroke/TIA Tissue Perfusion: Goal: Complications of ischemic stroke/TIA will be minimized Outcome: Progressing

## 2020-10-01 NOTE — Progress Notes (Signed)
Unable to complete 1200 assessment, pt is off the unit at dialysis.

## 2020-10-01 NOTE — Progress Notes (Addendum)
PROGRESS NOTE    Patrick Anthony  [id number] DOB: February 28, 1971 DOA: 09/27/2020 PCP: [name], MD  Chief Complaint  Patient presents with  . Generalized Body Aches    Brief Narrative:  50 year old male with medical history of end-stage renal disease due to APKD on hemodialysis MWF through left upper extremity AV graft, hypertension, OSA, chronic diastolic CHF, EF 55 to [id number], liver cirrhosis due to hepatitis C, history of alcohol abuse came to hospital with generalized weakness. Chest x-ray showed possible opacity at left lower lobe is concerning for pneumonia. CT abdomen/pelvis showed findings of adult polycystic kidney disease. Blood cultures now showing staph aureus.  ID is following.  Hospitalization complicated by embolic stroke with presumed endocarditis in setting of his bacteremia.  Assessment & Plan:   Principal Problem:   Pneumonia due to infectious agent Active Problems:   Chronic diastolic CHF (congestive heart failure) (HCC)   HTN (hypertension)   Anemia in chronic kidney disease   ESRD on dialysis ([place])   Sepsis ([place])   ADPKD (autosomal dominant polycystic kidney disease)   Sepsis due to pneumonia (HCC)   Lactic acidosis   Cerebral embolism with cerebral infarction   Bacteremia due to Staphylococcus aureus  1. New onset aphasia  Scattered Small Acute Infarcts within Bilateral Cerebral Hemispheres, Right Basal Ganglia, Callosal Splenium, and Bilateral Cerebellar Hemispheres  Concerning for Embolic Process: 1. Concerning for embolic stroke from endocarditis in setting of below 2. TEE pending (unable to perform at this time due to AMS - follow for opportunity for TEE) 3. Echo with EF 55-60%, grade II diastolic dysfunction (no intracardiac source of embolism detected on transthoracic study) 4. MRA head/neck without appreciable stenosis in common carotid, internal carotid, vertebral arteries.  No intracranial LVO or proximal high grade arterial stenosis.  5. HDL <10,  LDL not calculated - A1c 5.5 6. Holding anitplatelets/anticoagulants at this time until further w/u to rule out endocarditis and mycotic aneurysms 7. Neurology consult, appreciate recommendations  2. MRSA Bacteremia  Presumed Endocarditis  Possible Pneumonia 1. Presumed endocarditis in setting of his embolic stroke above 2. TEE pending (as above) 3. Given AMS, needs LP as well - planning to defer for now with recent thrombocytopenia, elevated INR, and AMS (inability to cooperate with exam) will defer for now (discussed with neurology/ID) -> follow for opportunity 4. 2/21 cx with MRSA bacteremia - sensitive to vancomycin 5. Repeat blood cultures 2/24 (pending) 6. CT abdomen pelvis without contrast on presentation with findings c/w PCKD, equivocal thickening of urinary bladder, no abscess in abdomen/pelvis (see report) 7. CXR 2/21 with streaky L basilar opacities 8. 2/11 right shoulder imaging with 9 cm calcified soft tissue mass (calcinosis of chronic renal failure) 9. No thoracolumbar discitis-osteomyelitis or epidural collection (see imaging result) 2/24 10. CT chest abdomen pelvis 2/23 with cavitary mass in LUL apex (2/2 cavitary infection vs septic embolus), small foci of consolidation and ground glass density in RUL and small ground glass nodule in LUL (infectious vs inflammatory), polycystic kidney disease, no CT evidence for acute intraabdominal or pelvic abnormality.  Calcified mass anterior to R shoulder with smller L paraspinal soft tissue calcification probably due to calcinosis. 11. Continue vancomycin per ID 12. Persistent leukocytosis - will continue to follow on abx, w/u for source as above (mild improvement today)  3. Acute Metabolic Encephalopathy 1. Likely related to infection above -> seems to be gradually improving today 2. Likely would benefit from LP, deferred at this time due to inability to cooperate with exam and  recent thrombocytopenia/elevated INR per neurology, follow    4. Seizure Like Activity of Upper Extremities 1. LTM EEG per neurology - mod diffuse encephalopathy  2. 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 ESRD due to adult polycystic kidney disease  Hyperkalemia  AGMA-patient gets dialysis MWF through left upper extremity fistula, continue PhosLo for hyperphosphatemia, Sensipar for secondary hyperparathyroidism.  1. Nephrology following.  6. Anemia of chronic disease-  1. Relatively stable 2. Continue to monitor.  3. Patient is on ESA as outpatient.  7. Hypertension  1. Clonidine patch while not consistently taking PO 2. IV hydralazine prn 3. Resume PO metoprolol, amlodipine, hydral  8. Sinus Tachycardia 1. 2/2 pain vs rebound tachycardia with metop on hold vs worsening infection (with worsening leukocytosis) 2. Treat pain, schedule metop - follow w/u for infection above 3. EKG with sinus tachycardia, appears similar to priors  9. Liver cirrhosis due to hepatitis C-patient has chronic HCV infection, HIV Patrick Anthony was nonreactive during prior hospital stay. 1. Elevated INR - imaging does not comment on cirrhosis or focal liver abnormality - no biliary dilatation - follow  10. Abnormal Chest CT 1. Short interval follow up recommended  11. Thrombocytopenia 1. resolved  DVT prophylaxis: SCD Code Status: full  Family Communication: none at bedside - daughter 2/25 Disposition:   Status is: Inpatient  Remains inpatient appropriate because:Inpatient level of care appropriate due to severity of illness   Dispo: The patient is from: Home              Anticipated d/c is to: pending              Anticipated d/c date is: > 3 days              Patient currently is not medically stable to d/c.   Difficult to place patient No  Consultants:   Renal  ID  Neurology  cardiology  Procedures:  TTE IMPRESSIONS    1. Left ventricular ejection fraction, by estimation, is 55 to 60%. The  left ventricle has normal  function. The left ventricle has no regional  wall motion abnormalities. There is moderate concentric left ventricular  hypertrophy. Left ventricular  diastolic parameters are consistent with Grade II diastolic dysfunction  (pseudonormalization).  2. Right ventricular systolic function is normal. The right ventricular  size is normal. There is normal pulmonary artery systolic pressure.  3. Left atrial size was moderately dilated.  4. Right atrial size was mild to moderately dilated.  5. The mitral valve is normal in structure. Mild to moderate mitral valve  regurgitation. No evidence of mitral stenosis. Moderate to severe mitral  annular calcification.  6. The aortic valve is tricuspid. Aortic valve regurgitation is trivial.  No aortic stenosis is present.  7. The inferior vena cava is normal in size with greater than 50%  respiratory variability, suggesting right atrial pressure of 3 mmHg.  8. Agitated saline contrast bubble study was negative, with no evidence  of any interatrial shunt.   Conclusion(s)/Recommendation(s): No intracardiac source of embolism  detected on this transthoracic study. Patrick Anthony transesophageal echocardiogram is  recommended to exclude cardiac source of embolism if clinically indicated.  Antimicrobials: Anti-infectives (From admission, onward)   Start     Dose/Rate Route Frequency Ordered Stop   10/01/20 2000  DAPTOmycin (CUBICIN) 840 mg in sodium chloride 0.9 % IVPB  Status:  Discontinued        840 mg 233.6 mL/hr over 30 Minutes Intravenous Every 48 hours 10/01/20 [id number]  10/01/20 1056   10/01/20 1200  vancomycin (VANCOCIN) IVPB 1000 mg/200 mL premix        1,000 mg 200 mL/hr over 60 Minutes Intravenous Every M-W-F (Hemodialysis) 10/01/20 1056     09/29/20 1200  vancomycin (VANCOCIN) IVPB 1000 mg/200 mL premix  Status:  Discontinued        1,000 mg 200 mL/hr over 60 Minutes Intravenous Every M-W-F (Hemodialysis) 09/27/20 1443 10/01/20 0922   09/29/20 1200   ceFEPIme (MAXIPIME) 2 g in sodium chloride 0.9 % 100 mL IVPB  Status:  Discontinued        2 g 200 mL/hr over 30 Minutes Intravenous Every M-W-F (Hemodialysis) 09/27/20 1443 09/28/20 1054   09/28/20 0900  metroNIDAZOLE (FLAGYL) IVPB 500 mg  Status:  Discontinued        500 mg 100 mL/hr over 60 Minutes Intravenous Every 12 hours 09/27/20 1808 09/28/20 1054   09/28/20 0900  ceFEPIme (MAXIPIME) 1 g in sodium chloride 0.9 % 100 mL IVPB  Status:  Discontinued        1 g 200 mL/hr over 30 Minutes Intravenous Every 12 hours 09/27/20 1808 09/27/20 1812   09/27/20 1445  vancomycin (VANCOREADY) IVPB 2000 mg/400 mL        2,000 mg 200 mL/hr over 120 Minutes Intravenous  Once 09/27/20 1433 09/27/20 2330   09/27/20 1430  ceFEPIme (MAXIPIME) 2 g in sodium chloride 0.9 % 100 mL IVPB        2 g 200 mL/hr over 30 Minutes Intravenous  Once 09/27/20 1418 09/27/20 1541   09/27/20 1430  metroNIDAZOLE (FLAGYL) IVPB 500 mg        500 mg 100 mL/hr over 60 Minutes Intravenous  Once 09/27/20 1418 09/27/20 2214   09/27/20 1430  vancomycin (VANCOREADY) IVPB 1750 mg/350 mL  Status:  Discontinued        1,750 mg 175 mL/hr over 120 Minutes Intravenous  Once 09/27/20 1418 09/27/20 1433     Subjective: Arnice Vanepps&ox1, but able to hold better conversation today Denies pain  Objective: Vitals:   10/01/20 1144 10/01/20 1200 10/01/20 1215 10/01/20 1230  BP: (!) 173/115 (!) 174/108 (!) 172/111 (!) 169/108  Pulse: (!) 105 (!) 105 (!) 101 (!) 102  Resp: 17 (!) 22    Temp: 98.6 F (37 C) 98.2 F (36.8 C)    TempSrc: Oral Oral    SpO2: 100% 100% 100% 100%  Weight:  80.1 kg    Height:        Intake/Output Summary (Last 24 hours) at 10/01/2020 1320 Last data filed at 10/01/2020 0900 Gross per 24 hour  Intake 0 ml  Output --  Net 0 ml   Filed Weights   09/27/20 1400 09/29/20 1240 10/01/20 1200  Weight: 83.9 kg 84.3 kg 80.1 kg    Examination:  General: No acute distress. Cardiovascular: Heart sounds show Sieara Bremer regular  rate, and rhythm. Lungs: Clear to auscultation bilaterally  Abdomen: Soft, nontender, nondistended Neurological: Alert and oriented 1. Moves all extremities 4. Follows commands. Skin: Warm and dry. No rashes or lesions. Extremities: No clubbing or cyanosis. No edema.   Data Reviewed: I have personally reviewed following labs and imaging studies  CBC: Recent Labs  Lab 09/27/20 1125 09/28/20 0436 09/29/20 1301 09/30/20 0610 10/01/20 0818  WBC 23.2* 29.6* 41.8* 41.6* 38.5*  NEUTROABS  --   --   --  37.0* 31.6*  HGB 11.0* 9.3* 9.9* 10.4* 9.1*  HCT 32.6* 28.1* 29.8* 32.0* 27.5*  MCV 82.3 81.7  81.6 81.6 80.6  PLT 83* 88* 124* 149* [id number]    Basic Metabolic Panel: Recent Labs  Lab 09/27/20 1416 09/28/20 0436 09/29/20 1301 09/30/20 0610 10/01/20 0818  NA 137 135 138 136 138  K 4.6 5.0 5.4* 4.9 4.6  CL 92* 96* 96* 95* 96*  CO2 23 22 17* 22 21*  GLUCOSE 93 119* 94 120* 94  BUN 48* 76* 123* 69* 103*  CREATININE 10.30* 11.10* 13.25* 8.63* 11.05*  CALCIUM 8.6* 8.0* 8.4* 8.6* 8.5*  MG  --   --   --  2.6* 3.1*  PHOS  --   --  7.8* 7.7* 7.5*    GFR: Estimated Creatinine Clearance: 8.9 mL/min (Devontay Celaya) (by C-G formula based on SCr of 11.05 mg/dL (H)).  Liver Function Tests: Recent Labs  Lab 09/27/20 1416 09/29/20 1301 09/30/20 0610 10/01/20 0818  AST 42* 54* 38 27  ALT '15 13 15 14  '$ ALKPHOS 107 93 92 80  BILITOT 2.1* 1.8* 1.5* 1.6*  PROT 8.4* 8.0 8.0 7.7  ALBUMIN 2.9* 2.3*  2.3* 2.4* 2.4*    CBG: Recent Labs  Lab 09/28/20 0721 09/28/20 0928  GLUCAP 92 103*     Recent Results (from the past 240 hour(s))  Blood Culture (routine x 2)     Status: Abnormal   Collection Time: 09/27/20  2:16 PM   Specimen: BLOOD RIGHT HAND  Result Value Ref Range Status   Specimen Description BLOOD RIGHT HAND  Final   Special Requests   Final    BOTTLES DRAWN AEROBIC AND ANAEROBIC Blood Culture results may not be optimal due to an inadequate volume of blood received in culture  bottles Performed at [facility] [address], [place], [place] [address]    Culture  Setup Time   Final    GRAM POSITIVE COCCI IN CLUSTERS IN BOTH AEROBIC AND ANAEROBIC BOTTLES    Culture METHICILLIN RESISTANT STAPHYLOCOCCUS AUREUS (Larene Ascencio)  Final   Report Status 09/30/2020 FINAL  Final   Organism ID, Bacteria METHICILLIN RESISTANT STAPHYLOCOCCUS AUREUS  Final      Susceptibility   Methicillin resistant staphylococcus aureus - MIC*    CIPROFLOXACIN >=8 RESISTANT Resistant     ERYTHROMYCIN >=8 RESISTANT Resistant     GENTAMICIN <=0.5 SENSITIVE Sensitive     OXACILLIN >=4 RESISTANT Resistant     TETRACYCLINE <=1 SENSITIVE Sensitive     VANCOMYCIN <=0.5 SENSITIVE Sensitive     TRIMETH/SULFA <=10 SENSITIVE Sensitive     CLINDAMYCIN <=0.25 SENSITIVE Sensitive     RIFAMPIN <=0.5 SENSITIVE Sensitive     Inducible Clindamycin NEGATIVE Sensitive     * METHICILLIN RESISTANT STAPHYLOCOCCUS AUREUS  Resp Panel by RT-PCR (Flu Patrick Anthony&B, Covid) Nasopharyngeal Swab     Status: None   Collection Time: 09/27/20  2:16 PM   Specimen: Nasopharyngeal Swab; Nasopharyngeal(NP) swabs in vial transport medium  Result Value Ref Range Status   SARS Coronavirus 2 by RT PCR NEGATIVE NEGATIVE Final    Comment: (NOTE) SARS-CoV-2 target nucleic acids are NOT DETECTED.  The SARS-CoV-2 RNA is generally detectable in upper respiratory specimens during the acute phase of infection. The lowest concentration of SARS-CoV-2 viral copies this assay can detect is 138 copies/mL. Patrick Anthony negative result does not preclude SARS-Cov-2 infection and should not be used as the sole basis for treatment or other patient management decisions. Patrick Anthony negative result may occur with  improper specimen collection/handling, submission of specimen other than nasopharyngeal swab, presence of viral mutation(s) within the areas targeted by  this assay, and inadequate number of viral copies(<138 copies/mL). Patrick Anthony negative result must be combined  with clinical observations, patient history, and epidemiological information. The expected result is Negative.  Fact Sheet for Patients:  EntrepreneurPulse.com.au  Fact Sheet for Healthcare Providers:  IncredibleEmployment.be  This test is no t yet approved or cleared by the [place] FDA and  has been authorized for detection and/or diagnosis of SARS-CoV-2 by FDA under an Emergency Use Authorization (EUA). This EUA will remain  in effect (meaning this test can be used) for the duration of the COVID-19 declaration under Section 564(b)(1) of the Act, 21 U.S.C.section 360bbb-3(b)(1), unless the authorization is terminated  or revoked sooner.       Influenza Rolando Whitby by PCR NEGATIVE NEGATIVE Final   Influenza B by PCR NEGATIVE NEGATIVE Final    Comment: (NOTE) The Xpert Xpress SARS-CoV-2/FLU/RSV plus assay is intended as an aid in the diagnosis of influenza from Nasopharyngeal swab specimens and should not be used as Kerstyn Coryell sole basis for treatment. Nasal washings and aspirates are unacceptable for Xpert Xpress SARS-CoV-2/FLU/RSV testing.  Fact Sheet for Patients: EntrepreneurPulse.com.au  Fact Sheet for Healthcare Providers: IncredibleEmployment.be  This test is not yet approved or cleared by the [place] FDA and has been authorized for detection and/or diagnosis of SARS-CoV-2 by FDA under an Emergency Use Authorization (EUA). This EUA will remain in effect (meaning this test can be used) for the duration of the COVID-19 declaration under Section 564(b)(1) of the Act, 21 U.S.C. section 360bbb-3(b)(1), unless the authorization is terminated or revoked.  Performed at [facility], [place] [address], [place], [place] [address]   Blood Culture ID Panel (Reflexed)     Status: Abnormal   Collection Time: 09/27/20  2:16 PM  Result Value Ref Range Status   Enterococcus faecalis NOT DETECTED NOT DETECTED Final    Enterococcus Faecium NOT DETECTED NOT DETECTED Final   Listeria monocytogenes NOT DETECTED NOT DETECTED Final   Staphylococcus species DETECTED (Berneita Sanagustin) NOT DETECTED Final    Comment: CRITICAL RESULT CALLED TO, READ BACK BY AND VERIFIED WITH: C,PIERCE PHARMD '@1009'$  09/28/20 EB    Staphylococcus aureus (BCID) DETECTED (Janet Humphreys) NOT DETECTED Final    Comment: Methicillin (oxacillin)-resistant Staphylococcus aureus (MRSA). MRSA is predictably resistant to beta-lactam antibiotics (except ceftaroline). Preferred therapy is vancomycin unless clinically contraindicated. Patient requires contact precautions if  hospitalized. CRITICAL RESULT CALLED TO, READ BACK BY AND VERIFIED WITH: C,PIERCE PHARMD '@1009'$  09/28/20 EB    Staphylococcus epidermidis NOT DETECTED NOT DETECTED Final   Staphylococcus lugdunensis NOT DETECTED NOT DETECTED Final   Streptococcus species NOT DETECTED NOT DETECTED Final   Streptococcus agalactiae NOT DETECTED NOT DETECTED Final   Streptococcus pneumoniae NOT DETECTED NOT DETECTED Final   Streptococcus pyogenes NOT DETECTED NOT DETECTED Final   Daily Crate.calcoaceticus-baumannii NOT DETECTED NOT DETECTED Final   Bacteroides fragilis NOT DETECTED NOT DETECTED Final   Enterobacterales NOT DETECTED NOT DETECTED Final   Enterobacter cloacae complex NOT DETECTED NOT DETECTED Final   Escherichia coli NOT DETECTED NOT DETECTED Final   Klebsiella aerogenes NOT DETECTED NOT DETECTED Final   Klebsiella oxytoca NOT DETECTED NOT DETECTED Final   Klebsiella pneumoniae NOT DETECTED NOT DETECTED Final   Proteus species NOT DETECTED NOT DETECTED Final   Salmonella species NOT DETECTED NOT DETECTED Final   Serratia marcescens NOT DETECTED NOT DETECTED Final   Haemophilus influenzae NOT DETECTED NOT DETECTED Final   Neisseria meningitidis NOT DETECTED NOT DETECTED Final   Pseudomonas aeruginosa NOT DETECTED NOT DETECTED  Final   Stenotrophomonas maltophilia NOT DETECTED NOT DETECTED Final   Candida albicans  NOT DETECTED NOT DETECTED Final   Candida auris NOT DETECTED NOT DETECTED Final   Candida glabrata NOT DETECTED NOT DETECTED Final   Candida krusei NOT DETECTED NOT DETECTED Final   Candida parapsilosis NOT DETECTED NOT DETECTED Final   Candida tropicalis NOT DETECTED NOT DETECTED Final   Cryptococcus neoformans/gattii NOT DETECTED NOT DETECTED Final   Meth resistant mecA/C and MREJ DETECTED (Zackari Ruane) NOT DETECTED Final    Comment: CRITICAL RESULT CALLED TO, READ BACK BY AND VERIFIED WITH: C,PIERCE PHARMD '@1009'$  09/28/20 EB Performed at [facility] [address], [place], [place] [address]   Blood Culture (routine x 2)     Status: Abnormal   Collection Time: 09/27/20  2:29 PM   Specimen: BLOOD  Result Value Ref Range Status   Specimen Description BLOOD SITE NOT SPECIFIED  Final   Special Requests   Final    BOTTLES DRAWN AEROBIC AND ANAEROBIC Blood Culture results may not be optimal due to an inadequate volume of blood received in culture bottles   Culture  Setup Time   Final    GRAM POSITIVE COCCI IN CLUSTERS ANAEROBIC BOTTLE ONLY CRITICAL VALUE NOTED.  VALUE IS CONSISTENT WITH PREVIOUSLY REPORTED AND CALLED VALUE.    Culture (Aarik Blank)  Final    STAPHYLOCOCCUS AUREUS SUSCEPTIBILITIES PERFORMED ON PREVIOUS CULTURE WITHIN THE LAST 5 DAYS. Performed at [facility], [place] [address], [place], [place] [address]    Report Status 10/01/2020 FINAL  Final  Culture, blood (routine x 2)     Status: None (Preliminary result)   Collection Time: 09/30/20  6:04 AM   Specimen: BLOOD RIGHT FOREARM  Result Value Ref Range Status   Specimen Description BLOOD RIGHT FOREARM  Final   Special Requests   Final    BOTTLES DRAWN AEROBIC AND ANAEROBIC Blood Culture adequate volume   Culture   Final    NO GROWTH 1 DAY Performed at [facility], [place] [address] [place], [place] [address]    Report Status PENDING  Incomplete  Culture, blood (routine x 2)     Status: None (Preliminary result)    Collection Time: 09/30/20  6:08 AM   Specimen: BLOOD RIGHT HAND  Result Value Ref Range Status   Specimen Description BLOOD RIGHT HAND  Final   Special Requests   Final    BOTTLES DRAWN AEROBIC AND ANAEROBIC Blood Culture results may not be optimal due to an inadequate volume of blood received in culture bottles   Culture   Final    NO GROWTH 1 DAY Performed at [facility], [place] [address] [place], [place] [address]    Report Status PENDING  Incomplete         Radiology Studies: CT CHEST W CONTRAST  Result Date: 09/29/2020 CLINICAL DATA:  Sepsis abdominal pain EXAM: CT CHEST, ABDOMEN, AND PELVIS WITH CONTRAST TECHNIQUE: Multidetector CT imaging of the chest, abdomen and pelvis was performed following the standard protocol during bolus administration of intravenous contrast. CONTRAST:  122m OMNIPAQUE IOHEXOL 300 MG/ML  SOLN COMPARISON:  CT 09/27/2020, shoulder x-ray 09/17/2020 FINDINGS: CT CHEST FINDINGS Cardiovascular: Nonaneurysmal aorta. Mild aortic atherosclerosis. No aneurysm or dissection. Cardiomegaly without significant pericardial effusion. 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 Mediastinum/Nodes: Midline trachea. No thyroid mass. No suspicious adenopathy. Esophagus within normal limits. Lungs/Pleura: No pleural effusion or pneumothorax. Cavitary mass  in the left upper lobe apex measuring 1.9 x 2.2 cm with surrounding ground-glass density, series 5, image number 43. 4 mm ground-glass nodule within the posterior left upper lobe, series 5, image number 78. small focus of consolidation and ground-glass density in the posterior right upper lobe, series 5, image number 67. Streaky atelectasis in the lower lobes. Musculoskeletal: Sternum is intact. Vertebral body heights are normal. Large calcified mass anterior to right shoulder. Similar smaller calcified mass within the left posterior paraspinal soft  tissues likely due to calcinosis. CT ABDOMEN PELVIS FINDINGS Hepatobiliary: No focal liver abnormality is seen. No gallstones, gallbladder wall thickening, or biliary dilatation. Pancreas: Unremarkable. No pancreatic ductal dilatation or surrounding inflammatory changes. Spleen: Normal in size without focal abnormality. Adrenals/Urinary Tract: Adrenal glands are normal. Polycystic kidney disease. Scattered parenchymal calcifications and cysts with mild calcifications. Chunky calcification posterior lower pole left kidney measuring 2 cm. No hydronephrosis. Empty urinary bladder. Stomach/Bowel: Stomach is within normal limits. Appendix appears normal. No evidence of bowel wall thickening, distention, or inflammatory changes. Vascular/Lymphatic: Mild to moderate aortic atherosclerosis without aneurysm. No suspicious nodes. Reproductive: Prostate unremarkable.  Seminal vesicle calcification Other: Negative for free air or free fluid. Musculoskeletal: No acute osseous abnormality. Postsurgical changes of the right femur. Multiple metallic foreign bodies in the right thigh. IMPRESSION: 1. Cavitary mass in the left upper lobe apex measuring up to 2.2 cm with surrounding ground-glass density. Findings could be secondary to cavitary infection or potential septic embolus. Angellica Maddison cavitary neoplasm is also in the differential. There are additional small foci of consolidation and ground-glass density in the right upper lobe and small ground-glass nodule in left upper lobe, potentially infectious or inflammatory. Short interval chest CT follow-up is recommended. 2. Polycystic kidney disease. 3. No CT evidence for acute intra-abdominal or pelvic abnormality. 4. 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 Aortic Atherosclerosis (ICD10-I70.0). Electronically Signed   By: Donavan Foil M.D.   On: 09/29/2020 20:54   MR  THORACIC SPINE WO CONTRAST  Result Date: 09/30/2020 CLINICAL DATA:  Bacteremia with generalized weakness EXAM: MRI THORACIC AND LUMBAR SPINE WITHOUT CONTRAST TECHNIQUE: Multiplanar and multiecho pulse sequences of the thoracic and lumbar spine were obtained without intravenous contrast. COMPARISON:  None. FINDINGS: MRI THORACIC SPINE FINDINGS Alignment:  Physiologic. Vertebrae: There is bone marrow edema and disc space edema at C6-7. Bone marrow signal is normal throughout the thoracic spine. Cord:  Normal signal and morphology. Paraspinal and other soft tissues: Negative. Disc levels: No thoracic spinal canal stenosis. MRI LUMBAR SPINE FINDINGS Segmentation:  Standard Alignment:  There is normal Vertebrae: Modic type 2 endplate signal changes at L5-S1. No discitis-osteomyelitis or epidural collection. Conus medullaris and cauda equina: Conus extends to the L1 level. Conus and cauda equina appear normal. Paraspinal and other soft tissues: Polycystic kidneys. Disc levels: Axial images severely degraded by motion. There is no spinal canal stenosis. At L5-S1 there is Kristene Liberati broad disc bulge but no neural foraminal stenosis. IMPRESSION: 1. No thoracolumbar discitis-osteomyelitis or epidural collection. 2. Mild bone marrow edema and disc space edema at C6-C7, likely degenerative. 3. Mild L5-S1 degenerative disc disease without spinal canal or neural foraminal stenosis. Electronically Signed   By: Ulyses Jarred M.D.   On: 09/30/2020 00:58   MR LUMBAR SPINE WO CONTRAST  Result Date: 09/30/2020 CLINICAL DATA:  Bacteremia with generalized weakness EXAM: MRI THORACIC AND LUMBAR SPINE WITHOUT CONTRAST TECHNIQUE: Multiplanar and multiecho  pulse sequences of the thoracic and lumbar spine were obtained without intravenous contrast. COMPARISON:  None. FINDINGS: MRI THORACIC SPINE FINDINGS Alignment:  Physiologic. Vertebrae: There is bone marrow edema and disc space edema at C6-7. Bone marrow signal is normal throughout the  thoracic spine. Cord:  Normal signal and morphology. Paraspinal and other soft tissues: Negative. Disc levels: No thoracic spinal canal stenosis. MRI LUMBAR SPINE FINDINGS Segmentation:  Standard Alignment:  There is normal Vertebrae: Modic type 2 endplate signal changes at L5-S1. No discitis-osteomyelitis or epidural collection. Conus medullaris and cauda equina: Conus extends to the L1 level. Conus and cauda equina appear normal. Paraspinal and other soft tissues: Polycystic kidneys. Disc levels: Axial images severely degraded by motion. There is no spinal canal stenosis. At L5-S1 there is Sudeep Scheibel broad disc bulge but no neural foraminal stenosis. IMPRESSION: 1. No thoracolumbar discitis-osteomyelitis or epidural collection. 2. Mild bone marrow edema and disc space edema at C6-C7, likely degenerative. 3. Mild L5-S1 degenerative disc disease without spinal canal or neural foraminal stenosis. Electronically Signed   By: Ulyses Jarred M.D.   On: 09/30/2020 00:58   CT ABDOMEN PELVIS W CONTRAST  Result Date: 09/29/2020 CLINICAL DATA:  Sepsis abdominal pain EXAM: CT CHEST, ABDOMEN, AND PELVIS WITH CONTRAST TECHNIQUE: Multidetector CT imaging of the chest, abdomen and pelvis was performed following the standard protocol during bolus administration of intravenous contrast. CONTRAST:  147m OMNIPAQUE IOHEXOL 300 MG/ML  SOLN COMPARISON:  CT 09/27/2020, shoulder x-ray 09/17/2020 FINDINGS: CT CHEST FINDINGS Cardiovascular: Nonaneurysmal aorta. Mild aortic atherosclerosis. No aneurysm or dissection. Cardiomegaly without significant pericardial effusion. 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 Mediastinum/Nodes: Midline trachea. No thyroid mass. No suspicious adenopathy. Esophagus within normal limits. Lungs/Pleura: No pleural effusion or pneumothorax. Cavitary mass in the left upper lobe apex measuring 1.9 x 2.2 cm with surrounding ground-glass  density, series 5, image number 43. 4 mm ground-glass nodule within the posterior left upper lobe, series 5, image number 78. small focus of consolidation and ground-glass density in the posterior right upper lobe, series 5, image number 67. Streaky atelectasis in the lower lobes. Musculoskeletal: Sternum is intact. Vertebral body heights are normal. Large calcified mass anterior to right shoulder. Similar smaller calcified mass within the left posterior paraspinal soft tissues likely due to calcinosis. CT ABDOMEN PELVIS FINDINGS Hepatobiliary: No focal liver abnormality is seen. No gallstones, gallbladder wall thickening, or biliary dilatation. Pancreas: Unremarkable. No pancreatic ductal dilatation or surrounding inflammatory changes. Spleen: Normal in size without focal abnormality. Adrenals/Urinary Tract: Adrenal glands are normal. Polycystic kidney disease. Scattered parenchymal calcifications and cysts with mild calcifications. Chunky calcification posterior lower pole left kidney measuring 2 cm. No hydronephrosis. Empty urinary bladder. Stomach/Bowel: Stomach is within normal limits. Appendix appears normal. No evidence of bowel wall thickening, distention, or inflammatory changes. Vascular/Lymphatic: Mild to moderate aortic atherosclerosis without aneurysm. No suspicious nodes. Reproductive: Prostate unremarkable.  Seminal vesicle calcification Other: Negative for free air or free fluid. Musculoskeletal: No acute osseous abnormality. Postsurgical changes of the right femur. Multiple metallic foreign bodies in the right thigh. IMPRESSION: 1. Cavitary mass in the left upper lobe apex measuring up to 2.2 cm with surrounding ground-glass density. Findings could be secondary to cavitary infection or potential septic embolus. Shalana Jardin cavitary neoplasm is also in the differential. There are additional small foci of consolidation and ground-glass density in the right upper lobe and small ground-glass nodule in left upper  lobe, potentially infectious or inflammatory. Short interval chest CT  follow-up is recommended. 2. Polycystic kidney disease. 3. No CT evidence for acute intra-abdominal or pelvic abnormality. 4. 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 Aortic Atherosclerosis (ICD10-I70.0). Electronically Signed   By: Donavan Foil M.D.   On: 09/29/2020 20:54        Scheduled Meds: . calcium acetate  667 mg Oral TID WC  . calcium acetate  667 mg Oral With snacks  . Chlorhexidine Gluconate Cloth  6 each Topical Q0600  . cinacalcet  30 mg Oral Q breakfast  . cloNIDine  0.1 mg Transdermal Weekly  . metoprolol tartrate  75 mg Oral BID   Continuous Infusions: . vancomycin       LOS: 4 days    Time spent: over 30 min    Patrick Helper, MD [facility]   To contact the attending provider between 7A-7P or the covering provider during after hours 7P-7A, please log into the web site [email or web address] and access using universal  password for that web site. If you do not have the password, please call the hospital operator.  10/01/2020, 1:20 PM

## 2020-10-01 NOTE — Progress Notes (Signed)
Pt returned to [facility] from dialysis

## 2020-10-01 NOTE — Progress Notes (Signed)
Pt transported off the unit in stretcher by transport to Dialysis unit. Report given prior to Hemodialysis RN.

## 2020-10-01 NOTE — Progress Notes (Signed)
[facility]    Date of Admission:  09/27/2020   Total days of antibiotics 5           ID: Jaquan Colliver is a 50 y.o. 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 He states he is still foggy on details of last few days. Follows commands  No N/V/D/HA/-12 point ros is negative WBC slightly improved  Medications:  . amLODipine  10 mg Oral Daily  . calcium acetate  667 mg Oral TID WC  . calcium acetate  667 mg Oral With snacks  . Chlorhexidine Gluconate Cloth  6 each Topical Q0600  . cinacalcet  30 mg Oral Q breakfast  . cloNIDine  0.1 mg Transdermal Weekly  . hydrALAZINE  50 mg Oral BID  . metoprolol tartrate  75 mg Oral BID    Objective: Vital signs in last 24 hours: Temp:  [98.2 F (36.8 C)-99.6 F (37.6 C)] 98.2 F (36.8 C) (02/25 1200) Pulse Rate:  [90-113] 111 (02/25 1330) Resp:  [17-24] 22 (02/25 1330) BP: (146-189)/(74-120) 163/115 (02/25 1330) SpO2:  [97 %-100 %] 100 % (02/25 1300) Weight:  [80.1 kg] 80.1 kg (02/25 1200) Physical Exam  Constitutional: He is oriented to person, place, and time. He appears well-developed and well-nourished. No distress.  HENT:  Mouth/Throat: Oropharynx is clear and moist. No oropharyngeal exudate.  Cardiovascular: Normal rate, regular rhythm and normal heart sounds. Exam reveals no gallop and no friction rub.  No murmur heard.  Pulmonary/Chest: Effort normal and breath sounds normal. No respiratory distress. He  has no wheezes.  Abdominal: Soft. Bowel sounds are normal. He exhibits no distension. There is no tenderness.  Lymphadenopathy:  He has no cervical adenopathy.  Neurological: He is alert and oriented to person, place, and time.  Skin: Skin is warm and dry. No rash noted. No erythema.  Psychiatric: He has a normal mood and affect. His behavior is normal.     Lab Results Recent Labs    09/30/20 0610 10/01/20 0818  WBC 41.6* 38.5*  HGB 10.4* 9.1*  HCT 32.0* 27.5*  NA 136 138  K 4.9 4.6  CL 95* 96*  CO2 22 21*  BUN 69* 103*  CREATININE 8.63* 11.05*   Liver Panel Recent Labs    09/29/20 1301 09/30/20 0610 10/01/20 0818  PROT 8.0 8.0 7.7  ALBUMIN 2.3*  2.3* 2.4* 2.4*  AST 54* 38 27  ALT '13 15 14  '$ ALKPHOS 93 92 80  BILITOT 1.8* 1.5* 1.6*  BILIDIR 0.5*  --   --   IBILI 1.3*  --   --     Microbiology: 2/24 blood cx NGTD 2/21 blood cx MRSA Studies/Results: CT CHEST W CONTRAST  Result Date: 09/29/2020 CLINICAL DATA:  Sepsis abdominal pain EXAM: CT CHEST, ABDOMEN, AND PELVIS WITH CONTRAST TECHNIQUE: Multidetector CT imaging of the chest, abdomen and pelvis was performed following the standard protocol during bolus administration of intravenous contrast. CONTRAST:  119m OMNIPAQUE IOHEXOL  300 MG/ML  SOLN COMPARISON:  CT 09/27/2020, shoulder x-ray 09/17/2020 FINDINGS: CT CHEST FINDINGS Cardiovascular: Nonaneurysmal aorta. Mild aortic atherosclerosis. No aneurysm or dissection. Cardiomegaly without significant pericardial effusion. 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 Mediastinum/Nodes: Midline trachea. No thyroid mass. No suspicious adenopathy. Esophagus within normal limits. Lungs/Pleura: No pleural effusion or pneumothorax. Cavitary mass in the left upper lobe apex measuring 1.9 x 2.2 cm with surrounding ground-glass density, series 5, image number 43. 4 mm ground-glass nodule within the posterior  left upper lobe, series 5, image number 78. small focus of consolidation and ground-glass density in the posterior right upper lobe, series 5, image number 67. Streaky atelectasis in the lower lobes. Musculoskeletal: Sternum is intact. Vertebral body heights are normal. Large calcified mass anterior to right shoulder. Similar smaller calcified mass within the left posterior paraspinal soft tissues likely due to calcinosis. CT ABDOMEN PELVIS FINDINGS Hepatobiliary: No focal liver abnormality is seen. No gallstones, gallbladder wall thickening, or biliary dilatation. Pancreas: Unremarkable. No pancreatic ductal dilatation or surrounding inflammatory changes. Spleen: Normal in size without focal abnormality. Adrenals/Urinary Tract: Adrenal glands are normal. Polycystic kidney disease. Scattered parenchymal calcifications and cysts with mild calcifications. Chunky calcification posterior lower pole left kidney measuring 2 cm. No hydronephrosis. Empty urinary bladder. Stomach/Bowel: Stomach is within normal limits. Appendix appears normal. No evidence of bowel wall thickening, distention, or inflammatory changes. Vascular/Lymphatic: Mild to moderate aortic atherosclerosis without aneurysm. No suspicious nodes. Reproductive: Prostate unremarkable.  Seminal vesicle calcification Other: Negative for free air or free fluid. Musculoskeletal: No acute osseous abnormality. Postsurgical changes of the right femur. Multiple metallic foreign bodies in the right thigh. IMPRESSION: 1. Cavitary mass in the left upper lobe apex measuring up to 2.2 cm with surrounding ground-glass density. Findings could be secondary to cavitary infection or potential septic embolus. A cavitary neoplasm is also in the differential. There are additional small foci of consolidation and ground-glass density in the right upper lobe and small ground-glass nodule in left upper lobe, potentially infectious or inflammatory. Short interval chest CT follow-up is  recommended. 2. Polycystic kidney disease. 3. No CT evidence for acute intra-abdominal or pelvic abnormality. 4. 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 Aortic Atherosclerosis (ICD10-I70.0). Electronically Signed   By: Donavan Foil M.D.   On: 09/29/2020 20:54   MR THORACIC SPINE WO CONTRAST  Result Date: 09/30/2020 CLINICAL DATA:  Bacteremia with generalized weakness EXAM: MRI THORACIC AND LUMBAR SPINE WITHOUT CONTRAST TECHNIQUE: Multiplanar and multiecho pulse sequences of the thoracic and lumbar spine were obtained without intravenous contrast. COMPARISON:  None. FINDINGS: MRI THORACIC SPINE FINDINGS Alignment:  Physiologic. Vertebrae: There is bone marrow edema and disc space edema at C6-7. Bone marrow signal is normal throughout the thoracic spine. Cord:  Normal signal and morphology. Paraspinal and other soft tissues: Negative. Disc levels: No thoracic spinal canal stenosis. MRI LUMBAR SPINE FINDINGS Segmentation:  Standard Alignment:  There is normal Vertebrae: Modic type 2 endplate signal changes at L5-S1. No discitis-osteomyelitis or epidural collection. Conus medullaris and cauda equina: Conus extends to the L1 level. Conus and cauda equina appear normal. Paraspinal and other soft tissues: Polycystic kidneys. Disc levels: Axial images severely degraded by motion. There is no spinal canal stenosis. At L5-S1 there is a broad disc bulge but no neural foraminal stenosis. IMPRESSION: 1. No thoracolumbar discitis-osteomyelitis or epidural collection. 2. Mild bone marrow edema  and disc space edema at C6-C7, likely degenerative. 3. Mild L5-S1 degenerative disc disease without spinal canal or neural foraminal stenosis. Electronically Signed   By: Ulyses Jarred M.D.   On: 09/30/2020 00:58   MR LUMBAR SPINE WO CONTRAST  Result Date: 09/30/2020 CLINICAL DATA:  Bacteremia with generalized  weakness EXAM: MRI THORACIC AND LUMBAR SPINE WITHOUT CONTRAST TECHNIQUE: Multiplanar and multiecho pulse sequences of the thoracic and lumbar spine were obtained without intravenous contrast. COMPARISON:  None. FINDINGS: MRI THORACIC SPINE FINDINGS Alignment:  Physiologic. Vertebrae: There is bone marrow edema and disc space edema at C6-7. Bone marrow signal is normal throughout the thoracic spine. Cord:  Normal signal and morphology. Paraspinal and other soft tissues: Negative. Disc levels: No thoracic spinal canal stenosis. MRI LUMBAR SPINE FINDINGS Segmentation:  Standard Alignment:  There is normal Vertebrae: Modic type 2 endplate signal changes at L5-S1. No discitis-osteomyelitis or epidural collection. Conus medullaris and cauda equina: Conus extends to the L1 level. Conus and cauda equina appear normal. Paraspinal and other soft tissues: Polycystic kidneys. Disc levels: Axial images severely degraded by motion. There is no spinal canal stenosis. At L5-S1 there is a broad disc bulge but no neural foraminal stenosis. IMPRESSION: 1. No thoracolumbar discitis-osteomyelitis or epidural collection. 2. Mild bone marrow edema and disc space edema at C6-C7, likely degenerative. 3. Mild L5-S1 degenerative disc disease without spinal canal or neural foraminal stenosis. Electronically Signed   By: Ulyses Jarred M.D.   On: 09/30/2020 00:58   CT ABDOMEN PELVIS W CONTRAST  Result Date: 09/29/2020 CLINICAL DATA:  Sepsis abdominal pain EXAM: CT CHEST, ABDOMEN, AND PELVIS WITH CONTRAST TECHNIQUE: Multidetector CT imaging of the chest, abdomen and pelvis was performed following the standard protocol during bolus administration of intravenous contrast. CONTRAST:  122m OMNIPAQUE IOHEXOL 300 MG/ML  SOLN COMPARISON:  CT 09/27/2020, shoulder x-ray 09/17/2020 FINDINGS: CT CHEST FINDINGS Cardiovascular: Nonaneurysmal aorta. Mild aortic atherosclerosis. No aneurysm or dissection. Cardiomegaly without significant pericardial  effusion. 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 Mediastinum/Nodes: Midline trachea. No thyroid mass. No suspicious adenopathy. Esophagus within normal limits. Lungs/Pleura: No pleural effusion or pneumothorax. Cavitary mass in the left upper lobe apex measuring 1.9 x 2.2 cm with surrounding ground-glass density, series 5, image number 43. 4 mm ground-glass nodule within the posterior left upper lobe, series 5, image number 78. small focus of consolidation and ground-glass density in the posterior right upper lobe, series 5, image number 67. Streaky atelectasis in the lower lobes. Musculoskeletal: Sternum is intact. Vertebral body heights are normal. Large calcified mass anterior to right shoulder. Similar smaller calcified mass within the left posterior paraspinal soft tissues likely due to calcinosis. CT ABDOMEN PELVIS FINDINGS Hepatobiliary: No focal liver abnormality is seen. No gallstones, gallbladder wall thickening, or biliary dilatation. Pancreas: Unremarkable. No pancreatic ductal dilatation or surrounding inflammatory changes. Spleen: Normal in size without focal abnormality. Adrenals/Urinary Tract: Adrenal glands are normal. Polycystic kidney disease. Scattered parenchymal calcifications and cysts with mild calcifications. Chunky calcification posterior lower pole left kidney measuring 2 cm. No hydronephrosis. Empty urinary bladder. Stomach/Bowel: Stomach is within normal limits. Appendix appears normal. No evidence of bowel wall thickening, distention, or inflammatory changes. Vascular/Lymphatic: Mild to moderate aortic atherosclerosis without aneurysm. No suspicious nodes. Reproductive: Prostate unremarkable.  Seminal vesicle calcification Other: Negative for free air or free fluid. Musculoskeletal: No acute osseous abnormality. Postsurgical changes of the right femur. Multiple metallic foreign bodies in the right thigh.  IMPRESSION: 1. Cavitary mass  in the left upper lobe apex measuring up to 2.2 cm with surrounding ground-glass density. Findings could be secondary to cavitary infection or potential septic embolus. A cavitary neoplasm is also in the differential. There are additional small foci of consolidation and ground-glass density in the right upper lobe and small ground-glass nodule in left upper lobe, potentially infectious or inflammatory. Short interval chest CT follow-up is recommended. 2. Polycystic kidney disease. 3. No CT evidence for acute intra-abdominal or pelvic abnormality. 4. 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 Aortic Atherosclerosis (ICD10-I70.0). Electronically Signed   By: Donavan Foil M.D.   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 = continue vancomycin, will not change therapy since appears improving as of today. Please get TEE since now he is more alert.  Encephalopathy = appears improving.   Chronic hepatitis C = does not appear to have been treated. Plan to get genotype and follow up in the ID clinic for treatment.  Elzie Rings [facility] Cell: [id number] Pager: [phone]  10/01/2020, 1:39 PM

## 2020-10-01 NOTE — Progress Notes (Signed)
[facility] Progress Note  Subjective: actually verbally responding today!  Vitals:   10/01/20 1400 10/01/20 1430 10/01/20 1500 10/01/20 1515  BP: (!) 140/99 (!) 139/95 (!) 130/58 129/84  Pulse:  (!) 109 (!) 112 (!) 108  Resp: 20 17 (!) 24 18  Temp:      TempSrc:      SpO2: 99% 100% 97% 99%  Weight:      Height:        Exam:   Pt awakens and says his name today  no jvd  Chest cta bilat  Cor reg no RG  Abd soft ntnd no ascites   Ext no LE edema   Alert, NF, ox3   LUA AVG +bruit     OP HD: [facility]   4h  350/500  82.5kg  2/2.5 bath  P1  Hep 5500  LUA AVG   Aranesp 71mg q2wks - last 09/27/20   Hectorol 363m IV qHD    Assessment/ Plan: 1. MRSA bacteremia: possible endocarditis w/ MRI + for embolic CVA's. Seen by cardiology, will need to be more stable to get TEE. High fevers down, WBC down but still high. Has AVG LUA w/o gross signs of infection. Getting IV vanc. ID following.  2. Acute CVA - multiple by MRI, prob embolic 3. AMS - encephalopathy appears to be improving today 4. ESRD - usual HD MWF.  Next HD Friday.   5. Hypertension/volume  - Blood pressure chronically hard to control. Mild vol excess, under dry wt. UF 2 kg on HD today.  6. Anemia of CKD - Hgb 9.3.  ESA recently dosed.  7. Secondary Hyperparathyroidism -  Ca in goal.  Will check phos.  Continue VDRA, sensipar and binders once eating.  8. Nutrition - Renal diet w/fluid restrictions once advanced.  9. Hx Hep C /liver cirrhosis   Rob Gaytha Raybourn 10/01/2020, 4:22 PM   Recent Labs  Lab 09/30/20 0610 10/01/20 0818  K 4.9 4.6  BUN 69* 103*  CREATININE 8.63* 11.05*  CALCIUM 8.6* 8.5*  PHOS 7.7* 7.5*  HGB 10.4* 9.1*   Inpatient medications: . amLODipine  10 mg Oral Daily  . calcium acetate  667 mg Oral TID WC  . calcium acetate  667 mg Oral With snacks  . Chlorhexidine Gluconate Cloth  6 each Topical Q0600  . cinacalcet  30 mg Oral Q breakfast  . cloNIDine  0.1 mg Transdermal Weekly   . hydrALAZINE  50 mg Oral BID  . metoprolol tartrate  75 mg Oral BID   . vancomycin     acetaminophen **OR** acetaminophen (TYLENOL) oral liquid 160 mg/5 mL **OR** acetaminophen, albuterol, HYDROmorphone (DILAUDID) injection, ondansetron **OR** ondansetron (ZOFRAN) IV, oxyCODONE, polyethylene glycol

## 2020-10-01 NOTE — Progress Notes (Signed)
OT Cancellation Note  Patient Details Name: Patrick Anthony MRN: [id number] DOB: 30-Aug-1970   Cancelled Treatment:    Reason Eval/Treat Not Completed: Patient off floor for HD. OT to check back as time allows.   Gloris Manchester OTR/L Supplemental OT, Department of rehab services [phone]   Ekam Bonebrake R H. 10/01/2020, 12:15 PM

## 2020-10-01 NOTE — Progress Notes (Signed)
  Speech Language Pathology Treatment: Dysphagia  Patient Details Name: Kanyon Grun MRN: [id number] DOB: 25-Jul-1971 Today's Date: 10/01/2020 Time: [id number] SLP Time Calculation (min) (ACUTE ONLY): 20 min  Assessment / Plan / Recommendation Clinical Impression  Pt's oropharyngeal swallow function appears [facility] even with larger consecutive sips of liquids and larger bites of solids. He benefited from Mod verbal cues for safety to ensure appropriate positioning while eating/drinking. Pt noted with increased verbalizations at the phrase level and continues to follow more one-step commands. Recommend DYS 2 diet and thin liquids with full supervision given pt's impulsivity. Discussed strategies with nurse such as ensuring that pt remains upright while eating. SLP will continue to f/u with potential for continued diet upgrade as pt's mentation clears.    HPI HPI: Pt is a 50 y.o male with sepsis likely due to pneumonia. MRI 2/22 showed scattered small acute infarcts within the bilateral cerebral  hemispheres, right basal ganglia, callosal splenium and bilateral cerebellar hemispheres. CXR from 2/21 revealed low lung volumes with streaky left basilar opacities which could  represent atelectasis or infection. PMH with significant history for ESRD on dialysis, hypertension, and CHF.      SLP Plan  Continue with current plan of care       Recommendations  Diet recommendations: Dysphagia 2 (fine chop);Thin liquid Liquids provided via: Cup;Straw Medication Administration: Whole meds with liquid Supervision: Patient able to self feed;Full supervision/cueing for compensatory strategies Compensations: Slow rate;Small sips/bites;Minimize environmental distractions Postural Changes and/or Swallow Maneuvers: Seated upright 90 degrees                Oral Care Recommendations: Oral care BID Follow up Recommendations: Inpatient Rehab;24 hour supervision/assistance SLP Visit Diagnosis: Dysphagia,  unspecified (R13.10) Plan: Continue with current plan of care       GO                Jeanine Luz., SLP Student 10/01/2020, 11:11 AM

## 2020-10-01 NOTE — Progress Notes (Signed)
PT Cancellation Note  Patient Details Name: Patrick Anthony MRN: [id number] DOB: 06-19-1971   Cancelled Treatment:    Reason Eval/Treat Not Completed: Patient at procedure or test/unavailable. Pt at HD treatment. Will follow-up as able.  Moishe Spice, PT, DPT Acute Rehabilitation Services  Pager: [phone] Office: [place] 10/01/2020, 12:55 PM

## 2020-10-02 DIAGNOSIS — D631 Anemia in chronic kidney disease: Secondary | ICD-10-CM | POA: Diagnosis not present

## 2020-10-02 DIAGNOSIS — I132 Hypertensive heart and chronic kidney disease with heart failure and with stage 5 chronic kidney disease, or end stage renal disease: Secondary | ICD-10-CM | POA: Diagnosis not present

## 2020-10-02 DIAGNOSIS — A419 Sepsis, unspecified organism: Secondary | ICD-10-CM | POA: Diagnosis not present

## 2020-10-02 DIAGNOSIS — J189 Pneumonia, unspecified organism: Secondary | ICD-10-CM | POA: Diagnosis not present

## 2020-10-02 DIAGNOSIS — Z992 Dependence on renal dialysis: Secondary | ICD-10-CM | POA: Diagnosis not present

## 2020-10-02 DIAGNOSIS — I5032 Chronic diastolic (congestive) heart failure: Secondary | ICD-10-CM | POA: Diagnosis not present

## 2020-10-02 DIAGNOSIS — I639 Cerebral infarction, unspecified: Secondary | ICD-10-CM | POA: Diagnosis not present

## 2020-10-02 DIAGNOSIS — I6340 Cerebral infarction due to embolism of unspecified cerebral artery: Secondary | ICD-10-CM | POA: Diagnosis not present

## 2020-10-02 DIAGNOSIS — R7881 Bacteremia: Secondary | ICD-10-CM | POA: Diagnosis not present

## 2020-10-02 DIAGNOSIS — B9561 Methicillin susceptible Staphylococcus aureus infection as the cause of diseases classified elsewhere: Secondary | ICD-10-CM | POA: Diagnosis not present

## 2020-10-02 DIAGNOSIS — R0602 Shortness of breath: Secondary | ICD-10-CM | POA: Diagnosis not present

## 2020-10-02 DIAGNOSIS — N186 End stage renal disease: Secondary | ICD-10-CM | POA: Diagnosis not present

## 2020-10-02 DIAGNOSIS — R531 Weakness: Secondary | ICD-10-CM | POA: Diagnosis not present

## 2020-10-02 LAB — COMPREHENSIVE METABOLIC PANEL
ALT: 13 U/L (ref 0–44)
AST: 25 U/L (ref 15–41)
Albumin: 2.3 g/dL — ABNORMAL LOW (ref 3.5–5.0)
Alkaline Phosphatase: 78 U/L (ref 38–126)
Anion gap: 16 — ABNORMAL HIGH (ref 5–15)
BUN: 54 mg/dL — ABNORMAL HIGH (ref 6–20)
CO2: 23 mmol/L (ref 22–32)
Calcium: 8.6 mg/dL — ABNORMAL LOW (ref 8.9–10.3)
Chloride: 96 mmol/L — ABNORMAL LOW (ref 98–111)
Creatinine, Ser: 7 mg/dL — ABNORMAL HIGH (ref 0.61–1.24)
GFR, Estimated: 9 mL/min — ABNORMAL LOW (ref >60)
Glucose, Bld: 115 mg/dL — ABNORMAL HIGH (ref 70–99)
Potassium: 4.1 mmol/L (ref 3.5–5.1)
Sodium: 135 mmol/L (ref 135–145)
Total Bilirubin: 1.7 mg/dL — ABNORMAL HIGH (ref 0.3–1.2)
Total Protein: 7.8 g/dL (ref 6.5–8.1)

## 2020-10-02 LAB — CBC WITH DIFFERENTIAL/PLATELET
Abs Immature Granulocytes: 0.6 10*3/uL — ABNORMAL HIGH (ref 0.00–0.07)
Band Neutrophils: 1 %
Basophils Absolute: 0 10*3/uL (ref 0.0–0.1)
Basophils Relative: 0 %
Eosinophils Absolute: 0 10*3/uL (ref 0.0–0.5)
Eosinophils Relative: 0 %
HCT: 27.6 % — ABNORMAL LOW (ref 39.0–52.0)
Hemoglobin: 9.5 g/dL — ABNORMAL LOW (ref 13.0–17.0)
Lymphocytes Relative: 3 %
Lymphs Abs: 1 10*3/uL (ref 0.7–4.0)
MCH: 27.9 pg (ref 26.0–34.0)
MCHC: 34.4 g/dL (ref 30.0–36.0)
MCV: 80.9 fL (ref 80.0–100.0)
Monocytes Absolute: 1.3 10*3/uL — ABNORMAL HIGH (ref 0.1–1.0)
Monocytes Relative: 4 %
Myelocytes: 2 %
Neutro Abs: 29.3 10*3/uL — ABNORMAL HIGH (ref 1.7–7.7)
Neutrophils Relative %: 90 %
Platelets: 185 10*3/uL (ref 150–400)
RBC: 3.41 MIL/uL — ABNORMAL LOW (ref 4.22–5.81)
RDW: 15.8 % — ABNORMAL HIGH (ref 11.5–15.5)
WBC: 32.2 10*3/uL — ABNORMAL HIGH (ref 4.0–10.5)
nRBC: 0 /100 WBC
nRBC: 0.4 % — ABNORMAL HIGH (ref 0.0–0.2)

## 2020-10-02 LAB — PHOSPHORUS: Phosphorus: 5.3 mg/dL — ABNORMAL HIGH (ref 2.5–4.6)

## 2020-10-02 LAB — GLUCOSE, CAPILLARY: Glucose-Capillary: 122 mg/dL — ABNORMAL HIGH (ref 70–99)

## 2020-10-02 LAB — PROTIME-INR
INR: 1.3 — ABNORMAL HIGH (ref 0.8–1.2)
Prothrombin Time: 15.4 seconds — ABNORMAL HIGH (ref 11.4–15.2)

## 2020-10-02 LAB — MAGNESIUM: Magnesium: 2.5 mg/dL — ABNORMAL HIGH (ref 1.7–2.4)

## 2020-10-02 NOTE — Progress Notes (Addendum)
STROKE TEAM PROGRESS NOTE   INTERVAL HISTORY No family at bedside. Pt seen at HD unit. Mildly sleepy but arouses to gentle stimulation. Notes state he is more awake than yesterday, orientated to place, hospital name, month, but not to year. Moving all extremities and mildly agitated. Pending TEE. Platelet normalized but INR still 1.3.   Vitals:   10/02/20 0001 10/02/20 0333 10/02/20 0700 10/02/20 0730  BP: (!) 136/95 (!) 134/100 (!) 142/88 (!) 142/88  Pulse: 81 86 79 79  Resp: '19  18 20  '$ Temp: 98.8 F (37.1 C) 99 F (37.2 C) 99.1 F (37.3 C) 99.1 F (37.3 C)  TempSrc: Oral Oral Oral Oral  SpO2: 100% 100% 96% 95%  Weight:      Height:       CBC:  Recent Labs  Lab 10/01/20 0818 10/02/20 0315  WBC 38.5* 32.2*  NEUTROABS 31.6* 29.3*  HGB 9.1* 9.5*  HCT 27.5* 27.6*  MCV 80.6 80.9  PLT 191 [id number]   Basic Metabolic Panel:  Recent Labs  Lab 10/01/20 0818 10/02/20 0315  NA 138 135  K 4.6 4.1  CL 96* 96*  CO2 21* 23  GLUCOSE 94 115*  BUN 103* 54*  CREATININE 11.05* 7.00*  CALCIUM 8.5* 8.6*  MG 3.1* 2.5*  PHOS 7.5* 5.3*   Lipid Panel:  Recent Labs  Lab 09/29/20 0435  CHOL 126  TRIG 342*  HDL <10*  CHOLHDL NOT CALCULATED  VLDL 68*  LDLCALC NOT CALCULATED   HgbA1c:  Recent Labs  Lab 09/29/20 0435  HGBA1C 5.5   Urine Drug Screen: No results for input(s): LABOPIA, COCAINSCRNUR, LABBENZ, AMPHETMU, THCU, LABBARB in the last 168 hours.  Alcohol Level No results for input(s): ETH in the last 168 hours.  MRA Neck Result date 09/28/2020 The visualized common carotid, internal carotid and vertebral arteries patent within the neck without appreciable stenosis.  MRA head WO contrast: Result date 09/28/2020 No intracranial large vessel occlusion or proximal high-grade arterial stenosis.  MRI Brain WO contrast Result date 09/28/2020 1. Scattered small acute infarcts within the bilateral cerebral hemispheres, right basal ganglia, callosal splenium and  bilateral cerebellar hemispheres. Multiple vascular territories are involved and findings are highly suspicious for an embolic process. 2. 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 A few scattered supratentorial chronic microhemorrhages are also present. 3. Mild generalized atrophy of the brain.  CT Head WO contrast Result date 09/28/2020 IMPRESSION: 1. 10 mm infarct within the medial left cerebellar hemisphere, which may be acute. Consider brain MRI for further evaluation. 2. No acute intracranial hemorrhage or acute demarcated cortical infarct.  EEG adult Result Date: 09/28/2020 IMPRESSION: This study initially showed generalized periodic discharges with triphasic morphology at 2-2.'5hz'$ . This eeg pattern can be on the ictal-interictal continuum and also seen with toxic-metabolic etiology. EEG improved after IV ativan which although not confirmatory is concerning for ictal nature of this pattern. There is also moderate diffuse encephalopathy, non specific etiology. One episode of right arm tremor was noted at 1528 during which eeg showed generalized periodic discharges with triphasic morphology at 2-2.5 without definite evolution. However, focal motor seizures may not be seen on scalp eeg. Therefore, clinical correlation is recommended. [name]   ECHOCARDIOGRAM COMPLETE BUBBLE STUDY Result Date: 09/28/2020    1. Left ventricular ejection fraction, by estimation, is 55 to 60%. The left ventricle has normal function. The left ventricle has no regional wall motion abnormalities. There is moderate concentric left ventricular hypertrophy. Left  ventricular diastolic parameters are consistent with Grade II diastolic dysfunction (pseudonormalization).  2. Right ventricular systolic function is normal. The right ventricular size is normal. There is normal pulmonary artery systolic pressure.  3. Left atrial size was moderately dilated.   4.  Right atrial size was mild to moderately dilated.   5. The mitral valve is normal in structure. Mild to moderate mitral valve regurgitation. No evidence of mitral stenosis. Moderate to severe mitral annular calcification.   6. The aortic valve is tricuspid. Aortic valve regurgitation is trivial. No aortic stenosis is present.   7. The inferior vena cava is normal in size with greater than 50% respiratory variability, suggesting right atrial pressure of 3 mmHg.   8. Agitated saline contrast bubble study was negative, with no evidence of any interatrial shunt.   PHYSICAL EXAM  Temp:  [97.9 F (36.6 C)-99.1 F (37.3 C)] 99.1 F (37.3 C) (02/26 0730) Pulse Rate:  [79-112] 79 (02/26 0730) Resp:  [17-24] 20 (02/26 0730) BP: (129-186)/(58-126) 142/88 (02/26 0730) SpO2:  [95 %-100 %] 95 % (02/26 0730) Weight:  [80.1 kg] 80.1 kg (02/25 1200)  General - Well nourished, well developed, sleepy and arouses to stimulation but mildly agitated.  Ophthalmologic - fundi not visualized due to noncooperation.  Cardiovascular - Regular rhythm and mild tachycardia.  Neuro - mildly sleepy, but arouses to stimulation to alert, answers questions and following commands but without stimulation, he will fall back to sleep. Orientated to self, place, hospital name and month, but not to year or age. Paucity of speech, but able to speak short sentences, able to name 2/4, following simple commands. No gaze palsy, track bilaterally, blinking to visual threat bilaterally, PERRL. No facial droop. Tongue midline. Bilateral UEs proximal 3/5 and distal 3+/5. Bilaterally LEs 3/5 at least. Sensation subjectively symmetrical and coordination intact but slow needed frequent prompt, gait not tested.   ASSESSMENT/PLAN Patrick Anthony is a 50 y.o. male with history of end-stage renal disease due to adult polycystic kidney disease on hemodialysis Monday, Wednesday, Friday with left upper extremity AV graft, hypertension,  obstructive sleep apnea, chronic heart failure with preserved ejection fraction, hepatitis C with liver cirrhosis, and history of alcohol abuse who presented to the hospital 09/27/20 for evaluation of generalized weakness, lethargy, tachycardia, fever, and chills. He was found to be septic with a temperature of 103.2, a heart rate in the 140's, leukocytosis, and an elevated lactic acidosis with lactic acid of 3.5; presumably secondary to a left lower lobe pneumonia identified on x-ray.  On 09/28/2020 at [place] he developed word finding difficulty and aphasia.  He was also noted to have tremor of the upper extremities, subsequently a code stroke was activated and he was taken to CT for imaging.  No TPA was given as he was outside of time window (LKN 0100).  He was not a thrombectomy candidate as no LVO seen on imaging.      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    MRI head: Scattered small acute infarcts within the bilateral cerebral hemispheres, right basal ganglia, callosal splenium and bilateral cerebellar hemispheres  MRA head and neck - negative  2D Echo EF 55-60%, Grade II diastolic dysfunction, moderate LVH  Recommend TEE to rule out infective endocardiditis  Direct LDL - 25.4  Triglycerides 342  HgbA1c 5.5  VTE prophylaxis -  Heparin with dialysis  NPO: speech therapy is following  No antithrombotic prior to admission, now on No antithrombotic due to the possibility of endocarditis  Therapy recommendations:  SNF  Disposition: pending  Tremor vs. Myoclonus  Asterixis   EEG: moderate diffuse encephalopathy, non specific etiology.  Multiple episodes of right arm tremor were noted without concomitant EEG change and are most likely not epileptic.   Likely due to encephalopathy    Ammonia  27  Significant asterixis, consistent with encephalopathy  Continue to treat underlying disease  ? MRSA bacteremia - ID following  ? Bacterial meningitis - ID following  Fever  leukocytosis  Tmax 100.6 -> afebrile  WBC 23.2->29.6->41.8->41.6->38.5  Highly suspicious for endocarditis  Can not rule out meningitis - however not good candidate for LP given elevated INR and agitation not cooperative and mental status improving - will hold off LP at this time  Will need TEE   Cardiology following   ID following on board - MRI T/L spine no source of infection - Vancomycin started 2/23 (6 weeks) - recommend CNS converage  ESRD on HD  Nephrology on board  HD toay  Hypertension  Home meds:  Amlodipine '10mg'$  daily, clonodine 0.'3mg'$  tid,  Stable  On norvasc 10, clonidine 0.1 patch, hydralazine 50 bid, metoprolol '75mg'$  bid  Gradually normalize BP in 2-3 days . BP goal normotensive  Hyperlipidemia  Home meds:  none  Direct LDL - 25.4, goal < 70  TG 342  No statin needed at this time given low LDL  Tobacco Abuse  Current smoker  Will need smoking cessation counseling once appropriate  Nicotine patch  Other Stroke Risk Factors  ETOH use  Coronary artery disease  Obstructive sleep apnea, unclear if he is on CPAP at home  Congestive heart failure diastolic CHF (0000000)  Other Active problems  Hepatitis C/ Cirrhosis of liver   Anemia of chronic disease - Hgb - 9.3->9.9->10.4->9.1  INR 1.4->1.3  Hospital day # 5  Today his mental status was overly concerning and reading the notes, he seems to be improving. In light of improving mentation and continued agitation would like to re-evaluate tomorrow for improvement. It is difficult to tell if encephalopathy is secondary to strokes, metabolic or infectious. However vancomycin has good CNS penetration in setting of meningeal inflammation.  Lynnae Sandhoff, MD Page: [phone] 10/02/2020 10:59 AM   To contact  Stroke Continuity provider, please refer to [email or web address] After hours, contact General Neurology

## 2020-10-02 NOTE — Progress Notes (Signed)
Physical Therapy Treatment Patient Details Name: Patrick Anthony MRN: [id number] DOB: 03-Feb-1971 Today's Date: 10/02/2020    History of Present Illness Pt is a 50 y.o. male presenting with generalized weakness that began ~10 days PTA along with tachycardia, fever/chills, chest pain with SOB, and mild nausea with abdominal pain. Pt found to have sepsis likely due to pneumonia due to infectious agent affecting L lower lobe. During admission, Code Stroke was called 2/22 after pt found to have AMS. Outside window for tPA. MRI revealed scattered small acute infarcts within bil cerebral hemispheres, R basal ganglia, callosal splenium and bil cerebellar hemispheres, suspecting embolic process. Pt with jerking of arms also, Stat EEG showed generalized periodic discharges with triphasic morphology-pattern that can be on active interictal continuum and also can be seen with toxic metabolic encephalopathy. PMHx significant for ESRD on dialysis MWF, HTN, OSA, chronic diastolic congestive heart failure, liver cirrhosis due to hepatitis C, and hx of drug and EtOH abuse.    PT Comments    Pt reports living alone in apartment, flight of stairs to enter with railings, working and driving. Pt does not identify any caregiver other than his daughter who lives in [place], she does not seem willing to stay with the patient 24/7 at the time of discharge during this session. Pt demonstrates improved functional mobility, requiring minimal physical assistance at this time for limited household ambulation with RW. Pt remains very flat and seems to have limited insight into his current deficits. Pt will benefit from continued acute PT POC to improve dynamic gait and balance to reduce falls risk. PT continues to recommend SNF placement at this time.  Follow Up Recommendations  SNF;Supervision/Assistance - 24 hour     Equipment Recommendations  Rolling walker with 5" wheels    Recommendations for Other Services        Precautions / Restrictions Precautions Precautions: Fall Restrictions Weight Bearing Restrictions: No    Mobility  Bed Mobility Overal bed mobility: Needs Assistance Bed Mobility: Supine to Sit     Supine to sit: Supervision          Transfers Overall transfer level: Needs assistance Equipment used: Rolling walker (2 wheeled) Transfers: Sit to/from Stand Sit to Stand: Min guard            Ambulation/Gait Ambulation/Gait assistance: Min guard Gait Distance (Feet): 20 Feet (20' x 2) Assistive device: Rolling walker (2 wheeled) Gait Pattern/deviations: Step-through pattern Gait velocity: reduced Gait velocity interpretation: <1.8 ft/sec, indicate of risk for recurrent falls General Gait Details: pt with slowed step-through gait, drifts laterally   Stairs             Wheelchair Mobility    Modified Rankin (Stroke Patients Only) Modified Rankin (Stroke Patients Only) Pre-Morbid Rankin Score: No symptoms Modified Rankin: Moderately severe disability     Balance Overall balance assessment: Needs assistance Sitting-balance support: No upper extremity supported;Feet supported Sitting balance-Leahy Scale: Fair     Standing balance support: Single extremity supported;Bilateral upper extremity supported Standing balance-Leahy Scale: Poor Standing balance comment: reliant on UE support of RW or railing, loses balance multiple times attempting to pee at commode, requiring UE support to prevent fall                            Cognition Arousal/Alertness: Awake/alert Behavior During Therapy: Flat affect Overall Cognitive Status: Impaired/Different from baseline Area of Impairment: Following commands;Safety/judgement;Awareness;Problem solving  Following Commands: Follows one step commands with increased time Safety/Judgement: Decreased awareness of safety;Decreased awareness of deficits Awareness:  Intellectual Problem Solving: Slow processing        Exercises      General Comments General comments (skin integrity, edema, etc.): VSS on RA      Pertinent Vitals/Pain Pain Assessment: No/denies pain    Home Living                      Prior Function            PT Goals (current goals can now be found in the care plan section) Acute Rehab PT Goals Patient Stated Goal: to return home Progress towards PT goals: Progressing toward goals    Frequency    Min 3X/week (keep 3x with possibility of progressing quickyl)      PT Plan Current plan remains appropriate    Co-evaluation              AM-PAC PT "6 Clicks" Mobility   Outcome Measure  Help needed turning from your back to your side while in a flat bed without using bedrails?: A Little Help needed moving from lying on your back to sitting on the side of a flat bed without using bedrails?: A Little Help needed moving to and from a bed to a chair (including a wheelchair)?: A Little Help needed standing up from a chair using your arms (e.g., wheelchair or bedside chair)?: A Little Help needed to walk in hospital room?: A Little Help needed climbing 3-5 steps with a railing? : Total 6 Click Score: 16    End of Session Equipment Utilized During Treatment: Gait belt Activity Tolerance: Other (comment) (pt self-limiting) Patient left: in bed;with call bell/phone within reach;with nursing/sitter in room;with family/visitor present Nurse Communication: Mobility status PT Visit Diagnosis: Unsteadiness on feet (R26.81);Muscle weakness (generalized) (M62.81);Difficulty in walking, not elsewhere classified (R26.2);Other symptoms and signs involving the nervous system (R29.898)     Time: [id number] PT Time Calculation (min) (ACUTE ONLY): 18 min  Charges:  $Gait Training: 8-22 mins                     Zenaida Niece, PT, DPT Acute Rehabilitation Pager: [phone]    Zenaida Niece 10/02/2020, 4:43  PM

## 2020-10-02 NOTE — Plan of Care (Signed)
  Problem: Education: Goal: Knowledge of disease or condition will improve Outcome: Progressing   Problem: Self-Care: Goal: Ability to participate in self-care as condition permits will improve Outcome: Progressing Goal: Ability to communicate needs accurately will improve Outcome: Progressing   Problem: Nutrition: Goal: Risk of aspiration will decrease Outcome: Progressing   Problem: Ischemic Stroke/TIA Tissue Perfusion: Goal: Complications of ischemic stroke/TIA will be minimized Outcome: Progressing

## 2020-10-02 NOTE — Progress Notes (Signed)
Pharmacy Antibiotic Note  Patrick Anthony is a 50 y.o. male admitted on 09/27/2020 with bacteremia, endocarditis.  Pharmacy has been consulted for Vancomycin dosing.  ID: Staph sepsis d/t pneumonia, presumed IE, - 2/24: No thoracolumbar discitis-osteomyelitis or epidural collection  - Tmax 99.61 WBC up 41.8>32.2 - LA 3.5>>1.1  Vanc 2/21>> Cefepime 2/21>>2/22 Flagyl 2/21>>2/22 (2 doses given)  2/21 blood x 2: Staph aureus, sens Vanc MIC < 0.5, Septra, TCN, gent MIC < 0.5, Clinda 2/22 BCID: MRSA in 1 of 4 bottles 2/21 COVID and flu: negative 2/21 HIV: non-reactive   Plan: Continue Vanco 1 gm MWF HD Plan pre-HD Vanco level Monday.  Height: 6' (182.9 cm) Weight: 80.1 kg (176 lb 9.4 oz) IBW/kg (Calculated) : 77.6  Temp (24hrs), Avg:98.7 F (37.1 C), Min:97.9 F (36.6 C), Max:99.1 F (37.3 C)  Recent Labs  Lab 09/27/20 1416 09/27/20 1900 09/28/20 0436 09/29/20 1301 09/30/20 0610 10/01/20 0818 10/02/20 0315  WBC  --   --  29.6* 41.8* 41.6* 38.5* 32.2*  CREATININE 10.30*  --  11.10* 13.25* 8.63* 11.05* 7.00*  LATICACIDVEN 3.5* 1.7 1.1  --   --   --   --     Estimated Creatinine Clearance: 14 mL/min (A) (by C-G formula based on SCr of 7 mg/dL (H)).    No Known Allergies  Patrick Anthony S. Patrick Anthony, PharmD, BCPS Clinical Staff Pharmacist [email or web address] Patrick Anthony 10/02/2020 10:08 AM

## 2020-10-02 NOTE — Progress Notes (Signed)
[facility] Progress Note  Subjective: mental status continues to improve, fully alert and verbal today. No c/o  Vitals:   10/02/20 0001 10/02/20 0333 10/02/20 0700 10/02/20 0730  BP: (!) 136/95 (!) 134/100 (!) 142/88 (!) 142/88  Pulse: 81 86 79 79  Resp: '19  18 20  '$ Temp: 98.8 F (37.1 C) 99 F (37.2 C) 99.1 F (37.3 C) 99.1 F (37.3 C)  TempSrc: Oral Oral Oral Oral  SpO2: 100% 100% 96% 95%  Weight:      Height:        Exam:   Gen - alert, conversant, Ox 3  no jvd  Chest cta bilat  Cor reg no RG  Abd soft ntnd no ascites   Ext no LE edema   Alert, NF, ox3   LUA AVG +bruit     OP HD: [facility]WF [place]   4h  350/500  82.5kg  2/2.5 bath  P1  Hep 5500  LUA AVG   Aranesp 64mg q2wks - last 09/27/20   Hectorol 399m IV qHD    Assessment/ Plan: 1. MRSA bacteremia: w/ fevers and AMS.  Suspected endocarditis w/ MRI + for embolic CVA's. Seen by cardiology, will need to be more stable to get TEE. High fevers down, WBC down but still high. Has AVG LUA w/o gross signs of infection. Getting IV vanc. ID following.  2. Acute CVA - multiple by MRI, prob embolic 3. AMS - encephalopathy much better 4. ESRD - usual HD MWF.  Next HD Monday.  5. Hypertension/volume  - Blood pressure chronically hard to control. Mild vol excess, under dry wt. 2.5 L off w/ HD yest. BP's good.  6. Anemia of CKD - Hgb 9.3.  Last esa at OP HD was 2/21, not due till 3/7.   7. Secondary Hyperparathyroidism -  Ca in goal.  Will check phos.  Continue VDRA, sensipar and binders once eating.  8. Nutrition - Renal diet w/fluid restrictions once advanced.  9. Hx Hep C /liver cirrhosis   Rob Ishani Goldwasser 10/02/2020, 10:10 AM   Recent Labs  Lab 10/01/20 0818 10/02/20 0315  K 4.6 4.1  BUN 103* 54*  CREATININE 11.05* 7.00*  CALCIUM 8.5* 8.6*  PHOS 7.5* 5.3*  HGB 9.1* 9.5*   Inpatient medications: . amLODipine  10 mg Oral Daily  . calcium acetate  667 mg Oral TID WC  . calcium acetate  667 mg Oral With  snacks  . Chlorhexidine Gluconate Cloth  6 each Topical Q0600  . cinacalcet  30 mg Oral Q breakfast  . cloNIDine  0.1 mg Transdermal Weekly  . hydrALAZINE  50 mg Oral BID  . metoprolol tartrate  75 mg Oral BID   . vancomycin Stopped (10/01/20 1921)   acetaminophen **OR** acetaminophen (TYLENOL) oral liquid 160 mg/5 mL **OR** acetaminophen, albuterol, HYDROmorphone (DILAUDID) injection, ondansetron **OR** ondansetron (ZOFRAN) IV, oxyCODONE, polyethylene glycol

## 2020-10-02 NOTE — Progress Notes (Addendum)
PROGRESS NOTE    Patrick Anthony  [id number] DOB: 02/12/1971 DOA: 09/27/2020 PCP: [name], MD  Chief Complaint  Patient presents with  . Generalized Body Aches    Brief Narrative:  50 year old male with medical history of end-stage renal disease due to APKD on hemodialysis MWF through left upper extremity AV graft, hypertension, OSA, chronic diastolic CHF, EF 55 to [id number], liver cirrhosis due to hepatitis C, history of alcohol abuse came to hospital with generalized weakness. Chest x-ray showed possible opacity at left lower lobe is concerning for pneumonia. CT abdomen/pelvis showed findings of adult polycystic kidney disease. Blood cultures now showing staph aureus.  ID is following.  Hospitalization complicated by embolic stroke with presumed endocarditis in setting of his bacteremia.  Assessment & Plan:   Principal Problem:   Pneumonia due to infectious agent Active Problems:   Chronic diastolic CHF (congestive heart failure) (HCC)   HTN (hypertension)   Anemia in chronic kidney disease   ESRD on dialysis ([place])   Sepsis ([place])   ADPKD (autosomal dominant polycystic kidney disease)   Sepsis due to pneumonia (HCC)   Lactic acidosis   Cerebral embolism with cerebral infarction   Bacteremia due to Staphylococcus aureus  1. New onset aphasia  Scattered Small Acute Infarcts within Bilateral Cerebral Hemispheres, Right Basal Ganglia, Callosal Splenium, and Bilateral Cerebellar Hemispheres  Concerning for Embolic Process: 1. Concerning for embolic stroke from endocarditis in setting of below 2. TEE pending (have asked cardiology to reeval for TEE) 3. Echo with EF 55-60%, grade II diastolic dysfunction (no intracardiac source of embolism detected on transthoracic study) 4. MRA head/neck without appreciable stenosis in common carotid, internal carotid, vertebral arteries.  No intracranial LVO or proximal high grade arterial stenosis.  5. HDL <10, LDL not calculated - A1c  5.5 6. Holding anitplatelets/anticoagulants at this time until further w/u to rule out endocarditis and mycotic aneurysms 7. Neurology consult, appreciate recommendations  2. MRSA Bacteremia  Presumed Endocarditis  Possible Pneumonia 1. Presumed endocarditis in setting of his embolic stroke above 2. TEE pending (as above) 3. Given AMS, needs LP as well - planning to defer for now with recent thrombocytopenia, elevated INR, and AMS (inability to cooperate with exam) will defer for now (discussed with neurology/ID) -> follow for opportunity (per neurology) 4. 2/21 cx with MRSA bacteremia - sensitive to vancomycin 5. Repeat blood cultures 2/24 (1/2 sets persistently positive with staph aureus) 6. Repeat blood cx 2/27 7. CT abdomen pelvis without contrast on presentation with findings c/w PCKD, equivocal thickening of urinary bladder, no abscess in abdomen/pelvis (see report) 8. CXR 2/21 with streaky L basilar opacities 9. No thoracolumbar discitis-osteomyelitis or epidural collection (see imaging result) 2/24 10. CT chest abdomen pelvis 2/23 with cavitary mass in LUL apex (2/2 cavitary infection vs septic embolus), small foci of consolidation and ground glass density in RUL and small ground glass nodule in LUL (infectious vs inflammatory), polycystic kidney disease, no CT evidence for acute intraabdominal or pelvic abnormality.  Calcified mass anterior to R shoulder with smller L paraspinal soft tissue calcification probably due to calcinosis. 11. Continue vancomycin per ID 12. Persistent leukocytosis - improving   3. Acute Metabolic Encephalopathy 1. Likely related to infection above -> seems to be gradually improving today 2. Likely would benefit from LP, deferred at this time due to inability to cooperate with exam and recent thrombocytopenia/elevated INR per neurology, follow   4. Seizure Like Activity of Upper Extremities 1. LTM EEG per neurology - mod diffuse encephalopathy  2. 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 ESRD due to adult polycystic kidney disease  Hyperkalemia  AGMA-patient gets dialysis MWF through left upper extremity fistula, continue PhosLo for hyperphosphatemia, Sensipar for secondary hyperparathyroidism.  1. Nephrology following.  6. Anemia of chronic disease-  1. Relatively stable 2. Continue to monitor.  3. Patient is on ESA as outpatient.  7. Hypertension  1. Clonidine patch while not consistently taking PO 2. BP improved 3. IV hydralazine prn 4. Resume PO metoprolol, amlodipine, hydral  8. Sinus Tachycardia 1. 2/2 pain vs rebound tachycardia with metop on hold vs worsening infection (with worsening leukocytosis) 2. Treat pain, schedule metop - follow w/u for infection above 3. EKG with sinus tachycardia, appears similar to priors  9. Liver cirrhosis due to hepatitis C-patient has chronic HCV infection, HIV Shelda Pal was nonreactive during prior hospital stay. 1. Elevated INR - imaging does not comment on cirrhosis or focal liver abnormality - no biliary dilatation - follow  10. Abnormal Chest CT 1. Short interval follow up recommended  11. Thrombocytopenia 1. resolved  DVT prophylaxis: SCD Code Status: full  Family Communication: none at bedside - daughter 2/26 Disposition:   Status is: Inpatient  Remains inpatient appropriate because:Inpatient level of care appropriate due to severity of illness   Dispo: The patient is from: Home              Anticipated d/c is to: pending              Anticipated d/c date is: > 3 days              Patient currently is not medically stable to d/c.   Difficult to place patient No  Consultants:   Renal  ID  Neurology  cardiology  Procedures:  TTE IMPRESSIONS    1. Left ventricular ejection fraction, by estimation, is 55 to 60%. The  left ventricle has normal function. The left ventricle has no regional  wall motion abnormalities. There is moderate concentric left  ventricular  hypertrophy. Left ventricular  diastolic parameters are consistent with Grade II diastolic dysfunction  (pseudonormalization).  2. Right ventricular systolic function is normal. The right ventricular  size is normal. There is normal pulmonary artery systolic pressure.  3. Left atrial size was moderately dilated.  4. Right atrial size was mild to moderately dilated.  5. The mitral valve is normal in structure. Mild to moderate mitral valve  regurgitation. No evidence of mitral stenosis. Moderate to severe mitral  annular calcification.  6. The aortic valve is tricuspid. Aortic valve regurgitation is trivial.  No aortic stenosis is present.  7. The inferior vena cava is normal in size with greater than 50%  respiratory variability, suggesting right atrial pressure of 3 mmHg.  8. Agitated saline contrast bubble study was negative, with no evidence  of any interatrial shunt.   Conclusion(s)/Recommendation(s): No intracardiac source of embolism  detected on this transthoracic study. Jeilani Grupe transesophageal echocardiogram is  recommended to exclude cardiac source of embolism if clinically indicated.  Antimicrobials: Anti-infectives (From admission, onward)   Start     Dose/Rate Route Frequency Ordered Stop   10/01/20 2000  DAPTOmycin (CUBICIN) 840 mg in sodium chloride 0.9 % IVPB  Status:  Discontinued        840 mg 233.6 mL/hr over 30 Minutes Intravenous Every 48 hours 10/01/20 0922 10/01/20 1056   10/01/20 1200  vancomycin (VANCOCIN) IVPB 1000 mg/200 mL premix        1,000  mg 200 mL/hr over 60 Minutes Intravenous Every M-W-F (Hemodialysis) 10/01/20 1056     09/29/20 1200  vancomycin (VANCOCIN) IVPB 1000 mg/200 mL premix  Status:  Discontinued        1,000 mg 200 mL/hr over 60 Minutes Intravenous Every M-W-F (Hemodialysis) 09/27/20 1443 10/01/20 0922   09/29/20 1200  ceFEPIme (MAXIPIME) 2 g in sodium chloride 0.9 % 100 mL IVPB  Status:  Discontinued        2 g 200 mL/hr  over 30 Minutes Intravenous Every M-W-F (Hemodialysis) 09/27/20 1443 09/28/20 1054   09/28/20 0900  metroNIDAZOLE (FLAGYL) IVPB 500 mg  Status:  Discontinued        500 mg 100 mL/hr over 60 Minutes Intravenous Every 12 hours 09/27/20 1808 09/28/20 1054   09/28/20 0900  ceFEPIme (MAXIPIME) 1 g in sodium chloride 0.9 % 100 mL IVPB  Status:  Discontinued        1 g 200 mL/hr over 30 Minutes Intravenous Every 12 hours 09/27/20 1808 09/27/20 1812   09/27/20 1445  vancomycin (VANCOREADY) IVPB 2000 mg/400 mL        2,000 mg 200 mL/hr over 120 Minutes Intravenous  Once 09/27/20 1433 09/27/20 2330   09/27/20 1430  ceFEPIme (MAXIPIME) 2 g in sodium chloride 0.9 % 100 mL IVPB        2 g 200 mL/hr over 30 Minutes Intravenous  Once 09/27/20 1418 09/27/20 1541   09/27/20 1430  metroNIDAZOLE (FLAGYL) IVPB 500 mg        500 mg 100 mL/hr over 60 Minutes Intravenous  Once 09/27/20 1418 09/27/20 2214   09/27/20 1430  vancomycin (VANCOREADY) IVPB 1750 mg/350 mL  Status:  Discontinued        1,750 mg 175 mL/hr over 120 Minutes Intravenous  Once 09/27/20 1418 09/27/20 1433     Subjective: No complaints at this time, confused, but pleasant (improved overall)  Objective: Vitals:   10/02/20 0700 10/02/20 0730 10/02/20 1127 10/02/20 1530  BP: (!) 142/88 (!) 142/88 132/89 (!) 129/92  Pulse: 79 79 88 71  Resp: '18 20 20 20  '$ Temp: 99.1 F (37.3 C) 99.1 F (37.3 C) 98.1 F (36.7 C) 98.9 F (37.2 C)  TempSrc: Oral Oral Oral Oral  SpO2: 96% 95% 95% 97%  Weight:      Height:        Intake/Output Summary (Last 24 hours) at 10/02/2020 1604 Last data filed at 10/02/2020 1433 Gross per 24 hour  Intake 1162 ml  Output 2 ml  Net 1160 ml   Filed Weights   09/27/20 1400 09/29/20 1240 10/01/20 1200  Weight: 83.9 kg 84.3 kg 80.1 kg    Examination:  General: No acute distress. Cardiovascular: RRR Lungs: unlabored Abdomen: Soft, nontender, nondistended Neurological: Alert, but confused. Moves all  extremities 4. Cranial nerves II through XII grossly intact. Skin: Warm and dry. No rashes or lesions. Extremities: No clubbing or cyanosis. No edema.   Data Reviewed: I have personally reviewed following labs and imaging studies  CBC: Recent Labs  Lab 09/28/20 0436 09/29/20 1301 09/30/20 0610 10/01/20 0818 10/02/20 0315  WBC 29.6* 41.8* 41.6* 38.5* 32.2*  NEUTROABS  --   --  37.0* 31.6* 29.3*  HGB 9.3* 9.9* 10.4* 9.1* 9.5*  HCT 28.1* 29.8* 32.0* 27.5* 27.6*  MCV 81.7 81.6 81.6 80.6 80.9  PLT 88* 124* 149* 191 [id number]    Basic Metabolic Panel: Recent Labs  Lab 09/28/20 0436 09/29/20 1301 09/30/20 0610 10/01/20 0818 10/02/20 0315  NA 135 138 136 138 135  K 5.0 5.4* 4.9 4.6 4.1  CL 96* 96* 95* 96* 96*  CO2 22 17* 22 21* 23  GLUCOSE 119* 94 120* 94 115*  BUN 76* 123* 69* 103* 54*  CREATININE 11.10* 13.25* 8.63* 11.05* 7.00*  CALCIUM 8.0* 8.4* 8.6* 8.5* 8.6*  MG  --   --  2.6* 3.1* 2.5*  PHOS  --  7.8* 7.7* 7.5* 5.3*    GFR: Estimated Creatinine Clearance: 14 mL/min (Algernon Mundie) (by C-G formula based on SCr of 7 mg/dL (H)).  Liver Function Tests: Recent Labs  Lab 09/27/20 1416 09/29/20 1301 09/30/20 0610 10/01/20 0818 10/02/20 0315  AST 42* 54* 38 27 25  ALT '15 13 15 14 13  '$ ALKPHOS 107 93 92 80 78  BILITOT 2.1* 1.8* 1.5* 1.6* 1.7*  PROT 8.4* 8.0 8.0 7.7 7.8  ALBUMIN 2.9* 2.3*  2.3* 2.4* 2.4* 2.3*    CBG: Recent Labs  Lab 09/28/20 0721 09/28/20 0928 10/02/20 1124  GLUCAP 92 103* 122*     Recent Results (from the past 240 hour(s))  Blood Culture (routine x 2)     Status: Abnormal   Collection Time: 09/27/20  2:16 PM   Specimen: BLOOD RIGHT HAND  Result Value Ref Range Status   Specimen Description BLOOD RIGHT HAND  Final   Special Requests   Final    BOTTLES DRAWN AEROBIC AND ANAEROBIC Blood Culture results may not be optimal due to an inadequate volume of blood received in culture bottles Performed at [facility] [address]  [place], [place] [address]    Culture  Setup Time   Final    GRAM POSITIVE COCCI IN CLUSTERS IN BOTH AEROBIC AND ANAEROBIC BOTTLES    Culture METHICILLIN RESISTANT STAPHYLOCOCCUS AUREUS (Janneth Krasner)  Final   Report Status 09/30/2020 FINAL  Final   Organism ID, Bacteria METHICILLIN RESISTANT STAPHYLOCOCCUS AUREUS  Final      Susceptibility   Methicillin resistant staphylococcus aureus - MIC*    CIPROFLOXACIN >=8 RESISTANT Resistant     ERYTHROMYCIN >=8 RESISTANT Resistant     GENTAMICIN <=0.5 SENSITIVE Sensitive     OXACILLIN >=4 RESISTANT Resistant     TETRACYCLINE <=1 SENSITIVE Sensitive     VANCOMYCIN <=0.5 SENSITIVE Sensitive     TRIMETH/SULFA <=10 SENSITIVE Sensitive     CLINDAMYCIN <=0.25 SENSITIVE Sensitive     RIFAMPIN <=0.5 SENSITIVE Sensitive     Inducible Clindamycin NEGATIVE Sensitive     * METHICILLIN RESISTANT STAPHYLOCOCCUS AUREUS  Resp Panel by RT-PCR (Flu Shay Jhaveri&B, Covid) Nasopharyngeal Swab     Status: None   Collection Time: 09/27/20  2:16 PM   Specimen: Nasopharyngeal Swab; Nasopharyngeal(NP) swabs in vial transport medium  Result Value Ref Range Status   SARS Coronavirus 2 by RT PCR NEGATIVE NEGATIVE Final    Comment: (NOTE) SARS-CoV-2 target nucleic acids are NOT DETECTED.  The SARS-CoV-2 RNA is generally detectable in upper respiratory specimens during the acute phase of infection. The lowest concentration of SARS-CoV-2 viral copies this assay can detect is 138 copies/mL. Deandra Goering negative result does not preclude SARS-Cov-2 infection and should not be used as the sole basis for treatment or other patient management decisions. Garnet Overfield negative result may occur with  improper specimen collection/handling, submission of specimen other than nasopharyngeal swab, presence of viral mutation(s) within the areas targeted by this assay, and inadequate number of viral copies(<138 copies/mL). Alexandre Lightsey negative result must be combined with clinical observations, patient history, and  epidemiological information.  The expected result is Negative.  Fact Sheet for Patients:  EntrepreneurPulse.com.au  Fact Sheet for Healthcare Providers:  IncredibleEmployment.be  This test is no t yet approved or cleared by the [place] FDA and  has been authorized for detection and/or diagnosis of SARS-CoV-2 by FDA under an Emergency Use Authorization (EUA). This EUA will remain  in effect (meaning this test can be used) for the duration of the COVID-19 declaration under Section 564(b)(1) of the Act, 21 U.S.C.section 360bbb-3(b)(1), unless the authorization is terminated  or revoked sooner.       Influenza Abimbola Aki by PCR NEGATIVE NEGATIVE Final   Influenza B by PCR NEGATIVE NEGATIVE Final    Comment: (NOTE) The Xpert Xpress SARS-CoV-2/FLU/RSV plus assay is intended as an aid in the diagnosis of influenza from Nasopharyngeal swab specimens and should not be used as Sheralyn Pinegar sole basis for treatment. Nasal washings and aspirates are unacceptable for Xpert Xpress SARS-CoV-2/FLU/RSV testing.  Fact Sheet for Patients: EntrepreneurPulse.com.au  Fact Sheet for Healthcare Providers: IncredibleEmployment.be  This test is not yet approved or cleared by the [place] FDA and has been authorized for detection and/or diagnosis of SARS-CoV-2 by FDA under an Emergency Use Authorization (EUA). This EUA will remain in effect (meaning this test can be used) for the duration of the COVID-19 declaration under Section 564(b)(1) of the Act, 21 U.S.C. section 360bbb-3(b)(1), unless the authorization is terminated or revoked.  Performed at [facility], [place] [address] [place], [place] [address]   Blood Culture ID Panel (Reflexed)     Status: Abnormal   Collection Time: 09/27/20  2:16 PM  Result Value Ref Range Status   Enterococcus faecalis NOT DETECTED NOT DETECTED Final   Enterococcus Faecium NOT DETECTED NOT DETECTED  Final   Listeria monocytogenes NOT DETECTED NOT DETECTED Final   Staphylococcus species DETECTED (Samar Venneman) NOT DETECTED Final    Comment: CRITICAL RESULT CALLED TO, READ BACK BY AND VERIFIED WITH: C,PIERCE PHARMD '@1009'$  09/28/20 EB    Staphylococcus aureus (BCID) DETECTED (Lindsee Labarre) NOT DETECTED Final    Comment: Methicillin (oxacillin)-resistant Staphylococcus aureus (MRSA). MRSA is predictably resistant to beta-lactam antibiotics (except ceftaroline). Preferred therapy is vancomycin unless clinically contraindicated. Patient requires contact precautions if  hospitalized. CRITICAL RESULT CALLED TO, READ BACK BY AND VERIFIED WITH: C,PIERCE PHARMD '@1009'$  09/28/20 EB    Staphylococcus epidermidis NOT DETECTED NOT DETECTED Final   Staphylococcus lugdunensis NOT DETECTED NOT DETECTED Final   Streptococcus species NOT DETECTED NOT DETECTED Final   Streptococcus agalactiae NOT DETECTED NOT DETECTED Final   Streptococcus pneumoniae NOT DETECTED NOT DETECTED Final   Streptococcus pyogenes NOT DETECTED NOT DETECTED Final   Jeter Tomey.calcoaceticus-baumannii NOT DETECTED NOT DETECTED Final   Bacteroides fragilis NOT DETECTED NOT DETECTED Final   Enterobacterales NOT DETECTED NOT DETECTED Final   Enterobacter cloacae complex NOT DETECTED NOT DETECTED Final   Escherichia coli NOT DETECTED NOT DETECTED Final   Klebsiella aerogenes NOT DETECTED NOT DETECTED Final   Klebsiella oxytoca NOT DETECTED NOT DETECTED Final   Klebsiella pneumoniae NOT DETECTED NOT DETECTED Final   Proteus species NOT DETECTED NOT DETECTED Final   Salmonella species NOT DETECTED NOT DETECTED Final   Serratia marcescens NOT DETECTED NOT DETECTED Final   Haemophilus influenzae NOT DETECTED NOT DETECTED Final   Neisseria meningitidis NOT DETECTED NOT DETECTED Final   Pseudomonas aeruginosa NOT DETECTED NOT DETECTED Final   Stenotrophomonas maltophilia NOT DETECTED NOT DETECTED Final   Candida albicans NOT DETECTED NOT DETECTED Final   Candida auris  NOT DETECTED NOT DETECTED Final   Candida glabrata NOT DETECTED NOT DETECTED Final   Candida krusei NOT DETECTED NOT DETECTED Final   Candida parapsilosis NOT DETECTED NOT DETECTED Final   Candida tropicalis NOT DETECTED NOT DETECTED Final   Cryptococcus neoformans/gattii NOT DETECTED NOT DETECTED Final   Meth resistant mecA/C and MREJ DETECTED (Sylar Voong) NOT DETECTED Final    Comment: CRITICAL RESULT CALLED TO, READ BACK BY AND VERIFIED WITH: C,PIERCE PHARMD '@1009'$  09/28/20 EB Performed at [facility] [address] [place], [place] [address]   Blood Culture (routine x 2)     Status: Abnormal   Collection Time: 09/27/20  2:29 PM   Specimen: BLOOD  Result Value Ref Range Status   Specimen Description BLOOD SITE NOT SPECIFIED  Final   Special Requests   Final    BOTTLES DRAWN AEROBIC AND ANAEROBIC Blood Culture results may not be optimal due to an inadequate volume of blood received in culture bottles   Culture  Setup Time   Final    GRAM POSITIVE COCCI IN CLUSTERS ANAEROBIC BOTTLE ONLY CRITICAL VALUE NOTED.  VALUE IS CONSISTENT WITH PREVIOUSLY REPORTED AND CALLED VALUE.    Culture (Rondrick Barreira)  Final    STAPHYLOCOCCUS AUREUS SUSCEPTIBILITIES PERFORMED ON PREVIOUS CULTURE WITHIN THE LAST 5 DAYS. Performed at [facility], [place] [address], [place], [place] [address]    Report Status 10/01/2020 FINAL  Final  Culture, blood (routine x 2)     Status: None (Preliminary result)   Collection Time: 09/30/20  6:04 AM   Specimen: BLOOD RIGHT FOREARM  Result Value Ref Range Status   Specimen Description BLOOD RIGHT FOREARM  Final   Special Requests   Final    BOTTLES DRAWN AEROBIC AND ANAEROBIC Blood Culture adequate volume   Culture   Final    NO GROWTH 1 DAY Performed at [facility], [place] [address] [place], [place] [address]    Report Status PENDING  Incomplete  Culture, blood (routine x 2)     Status: Abnormal (Preliminary result)   Collection Time: 09/30/20  6:08 AM   Specimen:  BLOOD RIGHT HAND  Result Value Ref Range Status   Specimen Description BLOOD RIGHT HAND  Final   Special Requests   Final    BOTTLES DRAWN AEROBIC AND ANAEROBIC Blood Culture results may not be optimal due to an inadequate volume of blood received in culture bottles   Culture  Setup Time   Final    GRAM POSITIVE COCCI IN CLUSTERS ANAEROBIC BOTTLE ONLY CRITICAL VALUE NOTED.  VALUE IS CONSISTENT WITH PREVIOUSLY REPORTED AND CALLED VALUE. Performed at [facility], [place] [address], [place], [place] [address]    Culture STAPHYLOCOCCUS AUREUS (Eutimio Gharibian)  Final   Report Status PENDING  Incomplete         Radiology Studies: No results found.      Scheduled Meds: . amLODipine  10 mg Oral Daily  . calcium acetate  667 mg Oral TID WC  . calcium acetate  667 mg Oral With snacks  . Chlorhexidine Gluconate Cloth  6 each Topical Q0600  . cinacalcet  30 mg Oral Q breakfast  . cloNIDine  0.1 mg Transdermal Weekly  . hydrALAZINE  50 mg Oral BID  . metoprolol tartrate  75 mg Oral BID   Continuous Infusions: . vancomycin Stopped (10/01/20 1921)     LOS: 5 days    Time spent: over 75 min    Fayrene Helper,  MD [facility]   To contact the attending provider between 7A-7P or the covering provider during after hours 7P-7A, please log into the web site [email or web address] and access using universal [place] password for that web site. If you do not have the password, please call the hospital operator.  10/02/2020, 4:04 PM

## 2020-10-03 DIAGNOSIS — J189 Pneumonia, unspecified organism: Secondary | ICD-10-CM | POA: Diagnosis not present

## 2020-10-03 DIAGNOSIS — I10 Essential (primary) hypertension: Secondary | ICD-10-CM

## 2020-10-03 DIAGNOSIS — I330 Acute and subacute infective endocarditis: Secondary | ICD-10-CM | POA: Diagnosis not present

## 2020-10-03 DIAGNOSIS — I639 Cerebral infarction, unspecified: Secondary | ICD-10-CM | POA: Diagnosis not present

## 2020-10-03 DIAGNOSIS — R0602 Shortness of breath: Secondary | ICD-10-CM | POA: Diagnosis not present

## 2020-10-03 DIAGNOSIS — I5032 Chronic diastolic (congestive) heart failure: Secondary | ICD-10-CM | POA: Diagnosis not present

## 2020-10-03 DIAGNOSIS — R7881 Bacteremia: Secondary | ICD-10-CM | POA: Diagnosis not present

## 2020-10-03 DIAGNOSIS — Z992 Dependence on renal dialysis: Secondary | ICD-10-CM | POA: Diagnosis not present

## 2020-10-03 DIAGNOSIS — I132 Hypertensive heart and chronic kidney disease with heart failure and with stage 5 chronic kidney disease, or end stage renal disease: Secondary | ICD-10-CM | POA: Diagnosis not present

## 2020-10-03 DIAGNOSIS — I6389 Other cerebral infarction: Secondary | ICD-10-CM | POA: Diagnosis not present

## 2020-10-03 DIAGNOSIS — I6310 Cerebral infarction due to embolism of unspecified precerebral artery: Secondary | ICD-10-CM | POA: Diagnosis not present

## 2020-10-03 DIAGNOSIS — Q612 Polycystic kidney, adult type: Secondary | ICD-10-CM | POA: Diagnosis not present

## 2020-10-03 DIAGNOSIS — N186 End stage renal disease: Secondary | ICD-10-CM | POA: Diagnosis not present

## 2020-10-03 DIAGNOSIS — A419 Sepsis, unspecified organism: Secondary | ICD-10-CM | POA: Diagnosis not present

## 2020-10-03 DIAGNOSIS — B9561 Methicillin susceptible Staphylococcus aureus infection as the cause of diseases classified elsewhere: Secondary | ICD-10-CM | POA: Diagnosis not present

## 2020-10-03 DIAGNOSIS — D631 Anemia in chronic kidney disease: Secondary | ICD-10-CM | POA: Diagnosis not present

## 2020-10-03 DIAGNOSIS — R531 Weakness: Secondary | ICD-10-CM | POA: Diagnosis not present

## 2020-10-03 LAB — COMPREHENSIVE METABOLIC PANEL
ALT: 11 U/L (ref 0–44)
AST: 19 U/L (ref 15–41)
Albumin: 2.2 g/dL — ABNORMAL LOW (ref 3.5–5.0)
Alkaline Phosphatase: 75 U/L (ref 38–126)
Anion gap: 19 — ABNORMAL HIGH (ref 5–15)
BUN: 71 mg/dL — ABNORMAL HIGH (ref 6–20)
CO2: 23 mmol/L (ref 22–32)
Calcium: 8.2 mg/dL — ABNORMAL LOW (ref 8.9–10.3)
Chloride: 91 mmol/L — ABNORMAL LOW (ref 98–111)
Creatinine, Ser: 8.93 mg/dL — ABNORMAL HIGH (ref 0.61–1.24)
GFR, Estimated: 7 mL/min — ABNORMAL LOW (ref >60)
Glucose, Bld: 123 mg/dL — ABNORMAL HIGH (ref 70–99)
Potassium: 3.9 mmol/L (ref 3.5–5.1)
Sodium: 133 mmol/L — ABNORMAL LOW (ref 135–145)
Total Bilirubin: 1.1 mg/dL (ref 0.3–1.2)
Total Protein: 7.3 g/dL (ref 6.5–8.1)

## 2020-10-03 LAB — CBC WITH DIFFERENTIAL/PLATELET
Abs Immature Granulocytes: 0 10*3/uL (ref 0.00–0.07)
Basophils Absolute: 0 10*3/uL (ref 0.0–0.1)
Basophils Relative: 0 %
Eosinophils Absolute: 0 10*3/uL (ref 0.0–0.5)
Eosinophils Relative: 0 %
HCT: 26.5 % — ABNORMAL LOW (ref 39.0–52.0)
Hemoglobin: 8.6 g/dL — ABNORMAL LOW (ref 13.0–17.0)
Lymphocytes Relative: 3 %
Lymphs Abs: 0.9 10*3/uL (ref 0.7–4.0)
MCH: 26.5 pg (ref 26.0–34.0)
MCHC: 32.5 g/dL (ref 30.0–36.0)
MCV: 81.8 fL (ref 80.0–100.0)
Monocytes Absolute: 2.2 10*3/uL — ABNORMAL HIGH (ref 0.1–1.0)
Monocytes Relative: 7 %
Neutro Abs: 28 10*3/uL — ABNORMAL HIGH (ref 1.7–7.7)
Neutrophils Relative %: 90 %
Platelets: 294 10*3/uL (ref 150–400)
RBC: 3.24 MIL/uL — ABNORMAL LOW (ref 4.22–5.81)
RDW: 16.1 % — ABNORMAL HIGH (ref 11.5–15.5)
WBC: 31.1 10*3/uL — ABNORMAL HIGH (ref 4.0–10.5)
nRBC: 0 /100 WBC
nRBC: 0.1 % (ref 0.0–0.2)

## 2020-10-03 LAB — MAGNESIUM: Magnesium: 2.6 mg/dL — ABNORMAL HIGH (ref 1.7–2.4)

## 2020-10-03 LAB — CULTURE, BLOOD (ROUTINE X 2)

## 2020-10-03 LAB — PHOSPHORUS: Phosphorus: 6 mg/dL — ABNORMAL HIGH (ref 2.5–4.6)

## 2020-10-03 MED ORDER — DARBEPOETIN ALFA 60 MCG/0.3ML IJ SOSY: 60.0000 ug | PREFILLED_SYRINGE | INTRAMUSCULAR | Status: DC

## 2020-10-03 NOTE — Progress Notes (Addendum)
PROGRESS NOTE    Patrick Anthony  [id number] DOB: 12-31-1970 DOA: 09/27/2020 PCP: [name], MD  Chief Complaint  Patient presents with  . Generalized Body Aches    Brief Narrative:  50 year old male with medical history of end-stage renal disease due to APKD on hemodialysis MWF through left upper extremity AV graft, hypertension, OSA, chronic diastolic CHF, EF 55 to [id number], liver cirrhosis due to hepatitis C, history of alcohol abuse came to hospital with generalized weakness. Chest x-ray showed possible opacity at left lower lobe is concerning for pneumonia. CT abdomen/pelvis showed findings of adult polycystic kidney disease. Blood cultures now showing staph aureus.  ID is following.  Hospitalization complicated by embolic stroke with presumed endocarditis in setting of his bacteremia.  Assessment & Plan:   Principal Problem:   Pneumonia due to infectious agent Active Problems:   Chronic diastolic CHF (congestive heart failure) (HCC)   HTN (hypertension)   Anemia in chronic kidney disease   ESRD on dialysis ([place])   Sepsis ([place])   ADPKD (autosomal dominant polycystic kidney disease)   Sepsis due to pneumonia (HCC)   Lactic acidosis   Cerebral embolism with cerebral infarction   Bacteremia due to Staphylococcus aureus  1. New onset aphasia  Scattered Small Acute Infarcts within Bilateral Cerebral Hemispheres, Right Basal Ganglia, Callosal Splenium, and Bilateral Cerebellar Hemispheres  Concerning for Embolic Process: 1. Concerning for embolic stroke from endocarditis in setting of below 2. TEE likely for 2/28, NPO at [facility] 3. Echo with EF 55-60%, grade II diastolic dysfunction (no intracardiac source of embolism detected on transthoracic study) 4. MRA head/neck without appreciable stenosis in common carotid, internal carotid, vertebral arteries.  No intracranial LVO or proximal high grade arterial stenosis.  5. HDL <10, LDL not calculated - A1c 5.5 6. Holding  anitplatelets/anticoagulants at this time until further w/u to rule out endocarditis and mycotic aneurysms 7. Neurology consult, appreciate recommendations  2. MRSA Bacteremia  Presumed Endocarditis  Possible Pneumonia 1. Presumed endocarditis in setting of his embolic stroke above 2. TEE pending (as above) 3. Given AMS, needs LP as well - deferred at this time per neurology 4. 2/21 cx with MRSA bacteremia - sensitive to vancomycin 5. Repeat blood cultures 2/24 (1/2 sets persistently positive with staph aureus) 6. Repeat blood cx 2/27 pending 7. CT abdomen pelvis without contrast on presentation with findings c/w PCKD, equivocal thickening of urinary bladder, no abscess in abdomen/pelvis (see report) 8. CXR 2/21 with streaky L basilar opacities 9. No thoracolumbar discitis-osteomyelitis or epidural collection (see imaging result) 2/24 10. CT chest abdomen pelvis 2/23 with cavitary mass in LUL apex (2/2 cavitary infection vs septic embolus), small foci of consolidation and ground glass density in RUL and small ground glass nodule in LUL (infectious vs inflammatory), polycystic kidney disease, no CT evidence for acute intraabdominal or pelvic abnormality.  Calcified mass anterior to R shoulder with smller L paraspinal soft tissue calcification probably due to calcinosis. 11. Continue vancomycin per ID 12. Persistent leukocytosis - improving   3. Acute Metabolic Encephalopathy 1. Likely related to infection above -> seems to be gradually improving today 2. Likely would benefit from LP - per neurology  4. Seizure Like Activity of Upper Extremities 1. LTM EEG per neurology - mod diffuse encephalopathy  2. 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 ESRD due to adult polycystic kidney disease  Hyperkalemia  AGMA-patient gets dialysis MWF through left upper extremity fistula, continue PhosLo for hyperphosphatemia, Sensipar for secondary hyperparathyroidism.  1. Nephrology  following.  6. Anemia of chronic disease-  1. Relatively stable 2. Continue to monitor.  3. Patient is on ESA as outpatient.  7. Hypertension  1. Clonidine patch while not consistently taking PO 2. BP improved 3. IV hydralazine prn 4. Resume PO metoprolol, amlodipine, hydral  8. Sinus Tachycardia 1. 2/2 pain vs rebound tachycardia with metop on hold vs worsening infection (with worsening leukocytosis) 2. Improved, continue metop  9. Liver cirrhosis due to hepatitis C-patient has chronic HCV infection, HIV Shelda Pal was nonreactive during prior hospital stay. 1. Elevated INR - imaging does not comment on cirrhosis or focal liver abnormality - no biliary dilatation - follow  10. Abnormal Chest CT 1. Short interval follow up recommended  11. Thrombocytopenia 1. resolved  DVT prophylaxis: SCD Code Status: full  Family Communication: none at bedside - no answer daughter 2/27 Disposition:   Status is: Inpatient  Remains inpatient appropriate because:Inpatient level of care appropriate due to severity of illness   Dispo: The patient is from: Home              Anticipated d/c is to: pending              Anticipated d/c date is: > 3 days              Patient currently is not medically stable to d/c.   Difficult to place patient No  Consultants:   Renal  ID  Neurology  cardiology  Procedures:  TTE IMPRESSIONS    1. Left ventricular ejection fraction, by estimation, is 55 to 60%. The  left ventricle has normal function. The left ventricle has no regional  wall motion abnormalities. There is moderate concentric left ventricular  hypertrophy. Left ventricular  diastolic parameters are consistent with Grade II diastolic dysfunction  (pseudonormalization).  2. Right ventricular systolic function is normal. The right ventricular  size is normal. There is normal pulmonary artery systolic pressure.  3. Left atrial size was moderately dilated.  4. Right atrial size was  mild to moderately dilated.  5. The mitral valve is normal in structure. Mild to moderate mitral valve  regurgitation. No evidence of mitral stenosis. Moderate to severe mitral  annular calcification.  6. The aortic valve is tricuspid. Aortic valve regurgitation is trivial.  No aortic stenosis is present.  7. The inferior vena cava is normal in size with greater than 50%  respiratory variability, suggesting right atrial pressure of 3 mmHg.  8. Agitated saline contrast bubble study was negative, with no evidence  of any interatrial shunt.   Conclusion(s)/Recommendation(s): No intracardiac source of embolism  detected on this transthoracic study. Khushboo Chuck transesophageal echocardiogram is  recommended to exclude cardiac source of embolism if clinically indicated.  Antimicrobials: Anti-infectives (From admission, onward)   Start     Dose/Rate Route Frequency Ordered Stop   10/01/20 2000  DAPTOmycin (CUBICIN) 840 mg in sodium chloride 0.9 % IVPB  Status:  Discontinued        840 mg 233.6 mL/hr over 30 Minutes Intravenous Every 48 hours 10/01/20 0922 10/01/20 1056   10/01/20 1200  vancomycin (VANCOCIN) IVPB 1000 mg/200 mL premix        1,000 mg 200 mL/hr over 60 Minutes Intravenous Every M-W-F (Hemodialysis) 10/01/20 1056     09/29/20 1200  vancomycin (VANCOCIN) IVPB 1000 mg/200 mL premix  Status:  Discontinued        1,000 mg 200 mL/hr over 60 Minutes Intravenous Every M-W-F (Hemodialysis) 09/27/20 1443 10/01/20 [id number]   09/29/20  1200  ceFEPIme (MAXIPIME) 2 g in sodium chloride 0.9 % 100 mL IVPB  Status:  Discontinued        2 g 200 mL/hr over 30 Minutes Intravenous Every M-W-F (Hemodialysis) 09/27/20 1443 09/28/20 1054   09/28/20 0900  metroNIDAZOLE (FLAGYL) IVPB 500 mg  Status:  Discontinued        500 mg 100 mL/hr over 60 Minutes Intravenous Every 12 hours 09/27/20 1808 09/28/20 1054   09/28/20 0900  ceFEPIme (MAXIPIME) 1 g in sodium chloride 0.9 % 100 mL IVPB  Status:  Discontinued         1 g 200 mL/hr over 30 Minutes Intravenous Every 12 hours 09/27/20 1808 09/27/20 1812   09/27/20 1445  vancomycin (VANCOREADY) IVPB 2000 mg/400 mL        2,000 mg 200 mL/hr over 120 Minutes Intravenous  Once 09/27/20 1433 09/27/20 2330   09/27/20 1430  ceFEPIme (MAXIPIME) 2 g in sodium chloride 0.9 % 100 mL IVPB        2 g 200 mL/hr over 30 Minutes Intravenous  Once 09/27/20 1418 09/27/20 1541   09/27/20 1430  metroNIDAZOLE (FLAGYL) IVPB 500 mg        500 mg 100 mL/hr over 60 Minutes Intravenous  Once 09/27/20 1418 09/27/20 2214   09/27/20 1430  vancomycin (VANCOREADY) IVPB 1750 mg/350 mL  Status:  Discontinued        1,750 mg 175 mL/hr over 120 Minutes Intravenous  Once 09/27/20 1418 09/27/20 1433     Subjective: No new complaints Can't tell me why he's here, but improved mental status over past few days  Objective: Vitals:   10/02/20 2319 10/03/20 0336 10/03/20 0800 10/03/20 1200  BP: 128/75 (!) 126/91 (!) 131/92 122/88  Pulse: 72 74 76 75  Resp: '16 14 15 20  '$ Temp: 98 F (36.7 C) 98.4 F (36.9 C) 98.4 F (36.9 C) 98.4 F (36.9 C)  TempSrc: Oral Axillary Oral Oral  SpO2: 98% 99% 99% 98%  Weight:      Height:        Intake/Output Summary (Last 24 hours) at 10/03/2020 1615 Last data filed at 10/03/2020 1239 Gross per 24 hour  Intake 1006 ml  Output --  Net 1006 ml   Filed Weights   09/27/20 1400 09/29/20 1240 10/01/20 1200  Weight: 83.9 kg 84.3 kg 80.1 kg    Examination:  General: No acute distress. Cardiovascular: RRR Lungs: unlabored Abdomen: Soft, nontender, nondistended  Neurological: Alert and oriented 3. Moves all extremities 4 . Cranial nerves II through XII grossly intact. Skin: Warm and dry. No rashes or lesions. Extremities: No clubbing or cyanosis. No edema.  Data Reviewed: I have personally reviewed following labs and imaging studies  CBC: Recent Labs  Lab 09/29/20 1301 09/30/20 0610 10/01/20 0818 10/02/20 0315 10/03/20 0242  WBC 41.8*  41.6* 38.5* 32.2* 31.1*  NEUTROABS  --  37.0* 31.6* 29.3* 28.0*  HGB 9.9* 10.4* 9.1* 9.5* 8.6*  HCT 29.8* 32.0* 27.5* 27.6* 26.5*  MCV 81.6 81.6 80.6 80.9 81.8  PLT 124* 149* 191 185 [id number]    Basic Metabolic Panel: Recent Labs  Lab 09/29/20 1301 09/30/20 0610 10/01/20 0818 10/02/20 0315 10/03/20 0242  NA 138 136 138 135 133*  K 5.4* 4.9 4.6 4.1 3.9  CL 96* 95* 96* 96* 91*  CO2 17* 22 21* 23 23  GLUCOSE 94 120* 94 115* 123*  BUN 123* 69* 103* 54* 71*  CREATININE 13.25* 8.63* 11.05* 7.00* 8.93*  CALCIUM  8.4* 8.6* 8.5* 8.6* 8.2*  MG  --  2.6* 3.1* 2.5* 2.6*  PHOS 7.8* 7.7* 7.5* 5.3* 6.0*    GFR: Estimated Creatinine Clearance: 11 mL/min (Kanetra Ho) (by C-G formula based on SCr of 8.93 mg/dL (H)).  Liver Function Tests: Recent Labs  Lab 09/29/20 1301 09/30/20 0610 10/01/20 0818 10/02/20 0315 10/03/20 0242  AST 54* 38 '27 25 19  '$ ALT '13 15 14 13 11  '$ ALKPHOS 93 92 80 78 75  BILITOT 1.8* 1.5* 1.6* 1.7* 1.1  PROT 8.0 8.0 7.7 7.8 7.3  ALBUMIN 2.3*  2.3* 2.4* 2.4* 2.3* 2.2*    CBG: Recent Labs  Lab 09/28/20 0721 09/28/20 0928 10/02/20 1124  GLUCAP 92 103* 122*     Recent Results (from the past 240 hour(s))  Blood Culture (routine x 2)     Status: Abnormal   Collection Time: 09/27/20  2:16 PM   Specimen: BLOOD RIGHT HAND  Result Value Ref Range Status   Specimen Description BLOOD RIGHT HAND  Final   Special Requests   Final    BOTTLES DRAWN AEROBIC AND ANAEROBIC Blood Culture results may not be optimal due to an inadequate volume of blood received in culture bottles Performed at [facility] [address] [place], [place] [address]    Culture  Setup Time   Final    GRAM POSITIVE COCCI IN CLUSTERS IN BOTH AEROBIC AND ANAEROBIC BOTTLES    Culture METHICILLIN RESISTANT STAPHYLOCOCCUS AUREUS (Kysha Muralles)  Final   Report Status 09/30/2020 FINAL  Final   Organism ID, Bacteria METHICILLIN RESISTANT STAPHYLOCOCCUS AUREUS  Final      Susceptibility   Methicillin resistant  staphylococcus aureus - MIC*    CIPROFLOXACIN >=8 RESISTANT Resistant     ERYTHROMYCIN >=8 RESISTANT Resistant     GENTAMICIN <=0.5 SENSITIVE Sensitive     OXACILLIN >=4 RESISTANT Resistant     TETRACYCLINE <=1 SENSITIVE Sensitive     VANCOMYCIN <=0.5 SENSITIVE Sensitive     TRIMETH/SULFA <=10 SENSITIVE Sensitive     CLINDAMYCIN <=0.25 SENSITIVE Sensitive     RIFAMPIN <=0.5 SENSITIVE Sensitive     Inducible Clindamycin NEGATIVE Sensitive     * METHICILLIN RESISTANT STAPHYLOCOCCUS AUREUS  Resp Panel by RT-PCR (Flu Tajae Maiolo&B, Covid) Nasopharyngeal Swab     Status: None   Collection Time: 09/27/20  2:16 PM   Specimen: Nasopharyngeal Swab; Nasopharyngeal(NP) swabs in vial transport medium  Result Value Ref Range Status   SARS Coronavirus 2 by RT PCR NEGATIVE NEGATIVE Final    Comment: (NOTE) SARS-CoV-2 target nucleic acids are NOT DETECTED.  The SARS-CoV-2 RNA is generally detectable in upper respiratory specimens during the acute phase of infection. The lowest concentration of SARS-CoV-2 viral copies this assay can detect is 138 copies/mL. Adaysha Dubinsky negative result does not preclude SARS-Cov-2 infection and should not be used as the sole basis for treatment or other patient management decisions. Whitlee Sluder negative result may occur with  improper specimen collection/handling, submission of specimen other than nasopharyngeal swab, presence of viral mutation(s) within the areas targeted by this assay, and inadequate number of viral copies(<138 copies/mL). Dyami Umbach negative result must be combined with clinical observations, patient history, and epidemiological information. The expected result is Negative.  Fact Sheet for Patients:  EntrepreneurPulse.com.au  Fact Sheet for Healthcare Providers:  IncredibleEmployment.be  This test is no t yet approved or cleared by the [place] FDA and  has been authorized for detection and/or diagnosis of SARS-CoV-2 by FDA under an  Emergency Use Authorization (EUA).  This EUA will remain  in effect (meaning this test can be used) for the duration of the COVID-19 declaration under Section 564(b)(1) of the Act, 21 U.S.C.section 360bbb-3(b)(1), unless the authorization is terminated  or revoked sooner.       Influenza Ann Bohne by PCR NEGATIVE NEGATIVE Final   Influenza B by PCR NEGATIVE NEGATIVE Final    Comment: (NOTE) The Xpert Xpress SARS-CoV-2/FLU/RSV plus assay is intended as an aid in the diagnosis of influenza from Nasopharyngeal swab specimens and should not be used as Noam Franzen sole basis for treatment. Nasal washings and aspirates are unacceptable for Xpert Xpress SARS-CoV-2/FLU/RSV testing.  Fact Sheet for Patients: EntrepreneurPulse.com.au  Fact Sheet for Healthcare Providers: IncredibleEmployment.be  This test is not yet approved or cleared by the [place] FDA and has been authorized for detection and/or diagnosis of SARS-CoV-2 by FDA under an Emergency Use Authorization (EUA). This EUA will remain in effect (meaning this test can be used) for the duration of the COVID-19 declaration under Section 564(b)(1) of the Act, 21 U.S.C. section 360bbb-3(b)(1), unless the authorization is terminated or revoked.  Performed at [facility], [place] [address] [place], [place] [address]   Blood Culture ID Panel (Reflexed)     Status: Abnormal   Collection Time: 09/27/20  2:16 PM  Result Value Ref Range Status   Enterococcus faecalis NOT DETECTED NOT DETECTED Final   Enterococcus Faecium NOT DETECTED NOT DETECTED Final   Listeria monocytogenes NOT DETECTED NOT DETECTED Final   Staphylococcus species DETECTED (Maebel Marasco) NOT DETECTED Final    Comment: CRITICAL RESULT CALLED TO, READ BACK BY AND VERIFIED WITH: C,PIERCE PHARMD '@1009'$  09/28/20 EB    Staphylococcus aureus (BCID) DETECTED (Adaiah Jaskot) NOT DETECTED Final    Comment: Methicillin (oxacillin)-resistant Staphylococcus aureus (MRSA). MRSA  is predictably resistant to beta-lactam antibiotics (except ceftaroline). Preferred therapy is vancomycin unless clinically contraindicated. Patient requires contact precautions if  hospitalized. CRITICAL RESULT CALLED TO, READ BACK BY AND VERIFIED WITH: C,PIERCE PHARMD '@1009'$  09/28/20 EB    Staphylococcus epidermidis NOT DETECTED NOT DETECTED Final   Staphylococcus lugdunensis NOT DETECTED NOT DETECTED Final   Streptococcus species NOT DETECTED NOT DETECTED Final   Streptococcus agalactiae NOT DETECTED NOT DETECTED Final   Streptococcus pneumoniae NOT DETECTED NOT DETECTED Final   Streptococcus pyogenes NOT DETECTED NOT DETECTED Final   Erastus Bartolomei.calcoaceticus-baumannii NOT DETECTED NOT DETECTED Final   Bacteroides fragilis NOT DETECTED NOT DETECTED Final   Enterobacterales NOT DETECTED NOT DETECTED Final   Enterobacter cloacae complex NOT DETECTED NOT DETECTED Final   Escherichia coli NOT DETECTED NOT DETECTED Final   Klebsiella aerogenes NOT DETECTED NOT DETECTED Final   Klebsiella oxytoca NOT DETECTED NOT DETECTED Final   Klebsiella pneumoniae NOT DETECTED NOT DETECTED Final   Proteus species NOT DETECTED NOT DETECTED Final   Salmonella species NOT DETECTED NOT DETECTED Final   Serratia marcescens NOT DETECTED NOT DETECTED Final   Haemophilus influenzae NOT DETECTED NOT DETECTED Final   Neisseria meningitidis NOT DETECTED NOT DETECTED Final   Pseudomonas aeruginosa NOT DETECTED NOT DETECTED Final   Stenotrophomonas maltophilia NOT DETECTED NOT DETECTED Final   Candida albicans NOT DETECTED NOT DETECTED Final   Candida auris NOT DETECTED NOT DETECTED Final   Candida glabrata NOT DETECTED NOT DETECTED Final   Candida krusei NOT DETECTED NOT DETECTED Final   Candida parapsilosis NOT DETECTED NOT DETECTED Final   Candida tropicalis NOT DETECTED NOT DETECTED Final   Cryptococcus neoformans/gattii NOT DETECTED NOT DETECTED Final   Meth resistant mecA/C  and MREJ DETECTED (Harrold Fitchett) NOT DETECTED Final     Comment: CRITICAL RESULT CALLED TO, READ BACK BY AND VERIFIED WITH: C,PIERCE PHARMD '@1009'$  09/28/20 EB Performed at [facility], [address] [place], [place] [address]   Blood Culture (routine x 2)     Status: Abnormal   Collection Time: 09/27/20  2:29 PM   Specimen: BLOOD  Result Value Ref Range Status   Specimen Description BLOOD SITE NOT SPECIFIED  Final   Special Requests   Final    BOTTLES DRAWN AEROBIC AND ANAEROBIC Blood Culture results may not be optimal due to an inadequate volume of blood received in culture bottles   Culture  Setup Time   Final    GRAM POSITIVE COCCI IN CLUSTERS ANAEROBIC BOTTLE ONLY CRITICAL VALUE NOTED.  VALUE IS CONSISTENT WITH PREVIOUSLY REPORTED AND CALLED VALUE.    Culture (Chanse Kagel)  Final    STAPHYLOCOCCUS AUREUS SUSCEPTIBILITIES PERFORMED ON PREVIOUS CULTURE WITHIN THE LAST 5 DAYS. Performed at [facility], [place] [address], [place], [place] [address]    Report Status 10/01/2020 FINAL  Final  Culture, blood (routine x 2)     Status: None (Preliminary result)   Collection Time: 09/30/20  6:04 AM   Specimen: BLOOD RIGHT FOREARM  Result Value Ref Range Status   Specimen Description BLOOD RIGHT FOREARM  Final   Special Requests   Final    BOTTLES DRAWN AEROBIC AND ANAEROBIC Blood Culture adequate volume   Culture   Final    NO GROWTH 3 DAYS Performed at [facility], [place] [address] [place], [place] [address]    Report Status PENDING  Incomplete  Culture, blood (routine x 2)     Status: Abnormal   Collection Time: 09/30/20  6:08 AM   Specimen: BLOOD RIGHT HAND  Result Value Ref Range Status   Specimen Description BLOOD RIGHT HAND  Final   Special Requests   Final    BOTTLES DRAWN AEROBIC AND ANAEROBIC Blood Culture results may not be optimal due to an inadequate volume of blood received in culture bottles   Culture  Setup Time   Final    GRAM POSITIVE COCCI IN CLUSTERS ANAEROBIC BOTTLE ONLY CRITICAL VALUE NOTED.  VALUE IS  CONSISTENT WITH PREVIOUSLY REPORTED AND CALLED VALUE.    Culture (Dawson Albers)  Final    STAPHYLOCOCCUS AUREUS SUSCEPTIBILITIES PERFORMED ON PREVIOUS CULTURE WITHIN THE LAST 5 DAYS. Performed at [facility], [place] [address] [place], [place] [address]    Report Status 10/03/2020 FINAL  Final         Radiology Studies: No results found.      Scheduled Meds: . amLODipine  10 mg Oral Daily  . calcium acetate  667 mg Oral TID WC  . calcium acetate  667 mg Oral With snacks  . Chlorhexidine Gluconate Cloth  6 each Topical Q0600  . cinacalcet  30 mg Oral Q breakfast  . cloNIDine  0.1 mg Transdermal Weekly  . [START ON 10/11/2020] darbepoetin (ARANESP) injection - DIALYSIS  60 mcg Intravenous Q14 Days  . hydrALAZINE  50 mg Oral BID  . metoprolol tartrate  75 mg Oral BID   Continuous Infusions: . vancomycin Stopped (10/01/20 1921)     LOS: 6 days    Time spent: over 30 min    Fayrene Helper, MD [facility]   To contact the attending provider between 7A-7P or the covering provider during after hours 7P-7A, please log into the web  site [email or web address] and access using universal [place] password for that web site. If you do not have the password, please call the hospital operator.  10/03/2020, 4:15 PM

## 2020-10-03 NOTE — Progress Notes (Signed)
Cardiology Progress Note  Patient ID: Patrick Anthony MRN: [id number] DOB: January 15, 1971 Date of Encounter: 10/03/2020  Primary Cardiologist: Patrick Him, MD  Subjective   Chief Complaint: None.  HPI: Admitted with MRSA bacteremia.  Likely septic emboli.  Loud harsh 3 out of 6 holosystolic murmur.  Cardiology asked to reevaluate for transesophageal echocardiogram.  Patrick Anthony is much more alert.  He is completely alert and oriented to person place time and situation.  He reports no difficulty swallowing and no loose teeth.  No bleeding in the stomach.  He is okay for transesophageal echocardiogram.  ROS:  All other ROS reviewed and negative. Pertinent positives noted in the HPI.     Inpatient Medications  Scheduled Meds: . amLODipine  10 mg Oral Daily  . calcium acetate  667 mg Oral TID WC  . calcium acetate  667 mg Oral With snacks  . Chlorhexidine Gluconate Cloth  6 each Topical Q0600  . cinacalcet  30 mg Oral Q breakfast  . cloNIDine  0.1 mg Transdermal Weekly  . hydrALAZINE  50 mg Oral BID  . metoprolol tartrate  75 mg Oral BID   Continuous Infusions: . vancomycin Stopped (10/01/20 1921)   PRN Meds: acetaminophen **OR** acetaminophen (TYLENOL) oral liquid 160 mg/5 mL **OR** acetaminophen, albuterol, HYDROmorphone (DILAUDID) injection, ondansetron **OR** ondansetron (ZOFRAN) IV, oxyCODONE, polyethylene glycol   Vital Signs   Vitals:   10/02/20 1936 10/02/20 2319 10/03/20 0336 10/03/20 0800  BP: 118/78 128/75 (!) 126/91 (!) 131/92  Pulse: 74 72 74 76  Resp: '20 16 14 15  '$ Temp: 97.6 F (36.4 C) 98 F (36.7 C) 98.4 F (36.9 C) 98.4 F (36.9 C)  TempSrc: Axillary Oral Axillary Oral  SpO2: 99% 98% 99% 99%  Weight:      Height:        Intake/Output Summary (Last 24 hours) at 10/03/2020 1049 Last data filed at 10/03/2020 0900 Gross per 24 hour  Intake 1407.62 ml  Output --  Net 1407.62 ml   Last 3 Weights 10/01/2020 09/29/2020 09/27/2020  Weight (lbs) 176 lb 9.4 oz 185  lb 13.6 oz 185 lb  Weight (kg) 80.1 kg 84.3 kg 83.915 kg      Telemetry  Overnight telemetry shows sinus rhythm in the 80s, which I personally reviewed.   ECG  The most recent ECG shows sinus tachycardia heart rate 127, no acute ischemic changes, LVH by voltage, which I personally reviewed.   Physical Exam   Vitals:   10/02/20 1936 10/02/20 2319 10/03/20 0336 10/03/20 0800  BP: 118/78 128/75 (!) 126/91 (!) 131/92  Pulse: 74 72 74 76  Resp: '20 16 14 15  '$ Temp: 97.6 F (36.4 C) 98 F (36.7 C) 98.4 F (36.9 C) 98.4 F (36.9 C)  TempSrc: Axillary Oral Axillary Oral  SpO2: 99% 98% 99% 99%  Weight:      Height:         Intake/Output Summary (Last 24 hours) at 10/03/2020 1049 Last data filed at 10/03/2020 0900 Gross per 24 hour  Intake 1407.62 ml  Output --  Net 1407.62 ml    Last 3 Weights 10/01/2020 09/29/2020 09/27/2020  Weight (lbs) 176 lb 9.4 oz 185 lb 13.6 oz 185 lb  Weight (kg) 80.1 kg 84.3 kg 83.915 kg    Body mass index is 23.95 kg/m.   General: Well nourished, well developed, in no acute distress Head: Atraumatic, normal size  Eyes: PEERLA, EOMI  Neck: Supple, no JVD Endocrine: No thryomegaly Cardiac: Normal S1, S2; RRR;  3 out of 6 harsh holosystolic murmur Lungs: Clear to auscultation bilaterally, no wheezing, rhonchi or rales  Abd: Soft, nontender, no hepatomegaly  Ext: No edema, pulses 2+ Musculoskeletal: No deformities, BUE and BLE strength normal and equal Skin: Warm and dry, no rashes   Neuro: Alert and oriented to person, place, time, and situation, CNII-XII grossly intact, no focal deficits  Psych: Normal mood and affect   Labs  High Sensitivity Troponin:  No results for input(s): TROPONINIHS in the last 720 hours.   Cardiac EnzymesNo results for input(s): TROPONINI in the last 168 hours. No results for input(s): TROPIPOC in the last 168 hours.  Chemistry Recent Labs  Lab 10/01/20 0818 10/02/20 0315 10/03/20 0242  NA 138 135 133*  K 4.6 4.1 3.9   CL 96* 96* 91*  CO2 21* 23 23  GLUCOSE 94 115* 123*  BUN 103* 54* 71*  CREATININE 11.05* 7.00* 8.93*  CALCIUM 8.5* 8.6* 8.2*  PROT 7.7 7.8 7.3  ALBUMIN 2.4* 2.3* 2.2*  AST '27 25 19  '$ ALT '14 13 11  '$ ALKPHOS 80 78 75  BILITOT 1.6* 1.7* 1.1  GFRNONAA 5* 9* 7*  ANIONGAP 21* 16* 19*    Hematology Recent Labs  Lab 10/01/20 0818 10/02/20 0315 10/03/20 0242  WBC 38.5* 32.2* 31.1*  RBC 3.41* 3.41* 3.24*  HGB 9.1* 9.5* 8.6*  HCT 27.5* 27.6* 26.5*  MCV 80.6 80.9 81.8  MCH 26.7 27.9 26.5  MCHC 33.1 34.4 32.5  RDW 15.7* 15.8* 16.1*  PLT 191 185 294   BNPNo results for input(s): BNP, PROBNP in the last 168 hours.  DDimer No results for input(s): DDIMER in the last 168 hours.   Radiology  No results found.  Cardiac Studies  TTE 09/28/2020 1. Left ventricular ejection fraction, by estimation, is 55 to 60%. The  left ventricle has normal function. The left ventricle has no regional  wall motion abnormalities. There is moderate concentric left ventricular  hypertrophy. Left ventricular  diastolic parameters are consistent with Grade II diastolic dysfunction  (pseudonormalization).  2. Right ventricular systolic function is normal. The right ventricular  size is normal. There is normal pulmonary artery systolic pressure.  3. Left atrial size was moderately dilated.  4. Right atrial size was mild to moderately dilated.  5. The mitral valve is normal in structure. Mild to moderate mitral valve  regurgitation. No evidence of mitral stenosis. Moderate to severe mitral  annular calcification.  6. The aortic valve is tricuspid. Aortic valve regurgitation is trivial.  No aortic stenosis is present.  7. The inferior vena cava is normal in size with greater than 50%  respiratory variability, suggesting right atrial pressure of 3 mmHg.  8. Agitated saline contrast bubble study was negative, with no evidence  of any interatrial shunt.   Patient Profile  Patrick Anthony is a 50  y.o. male with ESRD on hemodialysis, hypertension, OSA, diastolic heart failure, liver cirrhosis due to hepatitis C who was admitted with generalized weakness and found to have new onset aphasia with embolic strokes.  Blood cultures positive for MRSA.  Cardiology has been consulted for transesophageal echocardiogram.  Assessment & Plan   1. MRSA Bacteremia/Endocarditis -Admitted with embolic phenomenon.  MRSA positive bacteremia.  I reviewed his echocardiogram.  He has new splay artifact.  He is a loud holosystolic murmur.  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  I reviewed his echocardiogram from March 2021.  He had no evidence of mitral regurgitation.  His murmur appears to be  new as well. -Mentation is clear.  No difficulty swallowing.  He is appropriate for transesophageal echocardiogram tomorrow.  We will arrange this.  Please keep Anthony n.p.o. at midnight.  For questions or updates, please contact [place] Please consult [email or web address] for contact info under   Time Spent with Patient: I have spent a total of 25 minutes with patient reviewing hospital notes, telemetry, EKGs, labs and examining the patient as well as establishing an assessment and plan that was discussed with the patient.  > 50% of time was spent in direct patient care.    Signed, Addison Naegeli. Audie Box, [place]  10/03/2020 10:49 AM

## 2020-10-03 NOTE — Plan of Care (Signed)
  Problem: Education: Goal: Knowledge of disease or condition will improve Outcome: Progressing Goal: Knowledge of secondary prevention will improve Outcome: Progressing Goal: Knowledge of patient specific risk factors addressed and post discharge goals established will improve Outcome: Progressing Goal: Individualized Educational Video(s) Outcome: Progressing   Problem: Health Behavior/Discharge Planning: Goal: Ability to manage health-related needs will improve Outcome: Progressing   Problem: Self-Care: Goal: Ability to participate in self-care as condition permits will improve Outcome: Progressing Goal: Ability to communicate needs accurately will improve Outcome: Progressing   Problem: Nutrition: Goal: Risk of aspiration will decrease Outcome: Progressing Goal: Dietary intake will improve Outcome: Progressing   Problem: Ischemic Stroke/TIA Tissue Perfusion: Goal: Complications of ischemic stroke/TIA will be minimized Outcome: Progressing

## 2020-10-03 NOTE — Progress Notes (Addendum)
STROKE TEAM PROGRESS NOTE   INTERVAL HISTORY No acute events No family at bedside. Patient is up in chair. Denies new concerns or symptoms. He states understanding of plan for TEE tomorrow and NPO after MN.   Vitals:   10/02/20 2319 10/03/20 0336 10/03/20 0800 10/03/20 1200  BP: 128/75 (!) 126/91 (!) 131/92 122/88  Pulse: 72 74 76 75  Resp: '16 14 15 20  '$ Temp: 98 F (36.7 C) 98.4 F (36.9 C) 98.4 F (36.9 C) 98.4 F (36.9 C)  TempSrc: Oral Axillary Oral Oral  SpO2: 98% 99% 99% 98%  Weight:      Height:       CBC:  Recent Labs  Lab 10/02/20 0315 10/03/20 0242  WBC 32.2* 31.1*  NEUTROABS 29.3* 28.0*  HGB 9.5* 8.6*  HCT 27.6* 26.5*  MCV 80.9 81.8  PLT 185 [id number]   Basic Metabolic Panel:  Recent Labs  Lab 10/02/20 0315 10/03/20 0242  NA 135 133*  K 4.1 3.9  CL 96* 91*  CO2 23 23  GLUCOSE 115* 123*  BUN 54* 71*  CREATININE 7.00* 8.93*  CALCIUM 8.6* 8.2*  MG 2.5* 2.6*  PHOS 5.3* 6.0*   Lipid Panel:  Recent Labs  Lab 09/29/20 0435  CHOL 126  TRIG 342*  HDL <10*  CHOLHDL NOT CALCULATED  VLDL 68*  LDLCALC NOT CALCULATED   HgbA1c:  Recent Labs  Lab 09/29/20 0435  HGBA1C 5.5   Urine Drug Screen: No results for input(s): LABOPIA, COCAINSCRNUR, LABBENZ, AMPHETMU, THCU, LABBARB in the last 168 hours.  Alcohol Level No results for input(s): ETH in the last 168 hours.  MRA Neck Result date 09/28/2020 The visualized common carotid, internal carotid and vertebral arteries patent within the neck without appreciable stenosis.  MRA head WO contrast: Result date 09/28/2020 No intracranial large vessel occlusion or proximal high-grade arterial stenosis.  MRI Brain WO contrast Result date 09/28/2020 1. 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 Multiple vascular territories are involved and findings are highly suspicious for an embolic process. 2. Foci of  chronic hemorrhage within the bilateral cerebellar hemispheres and cerebellar vermis. A few scattered supratentorial chronic microhemorrhages are also present. 3. Mild generalized atrophy of the brain.  CT Head WO contrast Result date 09/28/2020 IMPRESSION: 1. 10 mm infarct within the medial left cerebellar hemisphere, which may be acute. Consider brain MRI for further evaluation. 2. No acute intracranial hemorrhage or acute demarcated cortical infarct.  EEG adult Result Date: 09/28/2020 IMPRESSION: This study initially showed generalized periodic discharges with triphasic morphology at 2-2.'5hz'$ . This eeg pattern can be on the ictal-interictal continuum and also seen with toxic-metabolic etiology. EEG improved after IV ativan which although not confirmatory is concerning for ictal nature of this pattern. There is also moderate diffuse encephalopathy, non specific etiology. One episode of right arm tremor was noted at 1528 during which eeg showed generalized periodic discharges with triphasic morphology at 2-2.5 without definite evolution. However, focal motor seizures may not be seen on scalp eeg. Therefore, clinical correlation is recommended. [name]   ECHOCARDIOGRAM COMPLETE BUBBLE STUDY Result Date: 09/28/2020    1. Left ventricular ejection fraction, by estimation, is 55 to 60%. The left ventricle has normal function. The left ventricle has no regional wall motion abnormalities. There is moderate concentric left ventricular hypertrophy. Left ventricular diastolic parameters are consistent with Grade II diastolic dysfunction (pseudonormalization).  2. Right ventricular systolic function is normal. The right  ventricular size is normal. There is normal pulmonary artery systolic pressure.  3. Left atrial size was moderately dilated.   4. Right atrial size was mild to moderately dilated.   5. The mitral valve is normal in structure. Mild to moderate mitral valve regurgitation. No  evidence of mitral stenosis. Moderate to severe mitral annular calcification.   6. The aortic valve is tricuspid. Aortic valve regurgitation is trivial. No aortic stenosis is present.   7. The inferior vena cava is normal in size with greater than 50% respiratory variability, suggesting right atrial pressure of 3 mmHg.   8. Agitated saline contrast bubble study was negative, with no evidence of any interatrial shunt.   PHYSICAL EXAM  Temp:  [97.6 F (36.4 C)-98.9 F (37.2 C)] 98.4 F (36.9 C) (02/27 1200) Pulse Rate:  [71-76] 75 (02/27 1200) Resp:  [14-20] 20 (02/27 1200) BP: (118-131)/(75-92) 122/88 (02/27 1200) SpO2:  [97 %-99 %] 98 % (02/27 1200)  General - Well nourished, well developed, sleepy and arouses to stimulation but mildly agitated.  Ophthalmologic - fundi not visualized due to noncooperation.  Cardiovascular - Regular rhythm and mild tachycardia.  Neuro -Resting with eyes closed. Arouses briskly with verbal stimuli.  Orientated to self, place, hospital name, year and month. When I ask about his situation he replies he is here for "heart stuff". Paucity of speech, but able to speak short sentences, able to name 2/4, following simple commands. No gaze palsy, track bilaterally, blinking to visual threat bilaterally, PERRL. No facial droop. Tongue midline. Bilateral UEs proximal 3/5 and distal 3+/5. Bilaterally LEs 3/5 at least. Sensation subjectively symmetrical and coordination intact but slow.  gait not tested.   ASSESSMENT/PLAN [name] is a 50 y.o. male with history of end-stage renal disease due to adult polycystic kidney disease on hemodialysis Monday, Wednesday, Friday with left upper extremity AV graft, hypertension, obstructive sleep apnea, chronic heart failure with preserved ejection fraction, hepatitis C with liver cirrhosis, and history of alcohol abuse who presented to the hospital 09/27/20 for evaluation of generalized weakness, lethargy, tachycardia,  fever, and chills. He was found to be septic with a temperature of 103.2, a heart rate in the 140's, leukocytosis, and an elevated lactic acidosis with lactic acid of 3.5; presumably secondary to a left lower lobe pneumonia identified on x-ray.  On 09/28/2020 at [place] he developed word finding difficulty and aphasia.  He was also noted to have tremor of the upper extremities, subsequently a code stroke was activated and he was taken to CT for imaging.  No TPA was given as he was outside of time window (LKN 0100).  He was not a thrombectomy candidate as no LVO seen on imaging.      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    MRI head: Scattered small acute infarcts within the bilateral cerebral hemispheres, right basal ganglia, callosal splenium and bilateral cerebellar hemispheres  MRA head and neck - negative  2D Echo EF 55-60%, Grade II diastolic dysfunction, moderate LVH  Recommend TEE to rule out infective endocardiditis  Direct LDL - 25.4  Triglycerides 342  HgbA1c 5.5  VTE prophylaxis - Heparin with dialysis  NPO: speech therapy is following  No antithrombotic prior to admission, now on No antithrombotic due to the possibility of endocarditis  Therapy recommendations:  SNF  Disposition: pending  Tremor vs. Myoclonus Asterixis   EEG: moderate diffuse encephalopathy, non specific etiology.  Multiple episodes of right arm tremor were noted without concomitant EEG change and are most likely not epileptic.   Likely due to encephalopathy    Ammonia 27  Significant asterixis, consistent with encephalopathy  Continue to treat underlying disease  ? MRSA bacteremia - ID following  ? Bacterial meningitis - ID following  Fever  leukocytosis  Tmax 100.6 -> afebrile  WBC  23.2->29.6->41.8->41.6->38.5->31.1  Highly suspicious for endocarditis  Can not rule out meningitis - however not good candidate for LP given elevated INR and agitation not cooperative and mental status improving - will hold off LP at this time  TEE planned for tomorrow, NPO after midnight  Cardiology following: suspect mitral valve endocarditis   ID following on board - MRI T/L spine no source of infection - Vancomycin started 2/23 (6 weeks) - recommend CNS converage  ESRD on HD  Nephrology on board  Hypertension  Home meds:  Amlodipine '10mg'$  daily, clonodine 0.'3mg'$  tid,  Stable  On norvasc 10, clonidine 0.1 patch, hydralazine 50 bid, metoprolol '75mg'$  bid  Gradually normalize BP in 2-3 days . BP goal normotensive  Hyperlipidemia  Home meds:  none  Direct LDL - 25.4, goal < 70  TG 342  No statin needed at this time given low LDL  Tobacco Abuse  Current smoker  Will need smoking cessation counseling once appropriate  Nicotine patch  Other Stroke Risk Factors  ETOH use  Coronary artery disease  Obstructive sleep apnea, unclear if he is on CPAP at home  Congestive heart failure diastolic CHF (0000000)  Other Active problems  Hepatitis C/ Cirrhosis of liver   Anemia of chronic disease - Hgb - 9.3->9.9->10.4->9.1->8.6  INR 1.4->1.3  Hospital day # 6    Lynnae Sandhoff, MD Page: [phone]  To contact Stroke Continuity provider, please refer to [email or web address] After hours, contact General Neurology

## 2020-10-03 NOTE — H&P (View-Only) (Signed)
Cardiology Progress Note  Patient ID: Patrick Anthony MRN: [id number] DOB: 09-Nov-1970 Date of Encounter: 10/03/2020  Primary Cardiologist: Fransico Him, MD  Subjective   Chief Complaint: None.  HPI: Admitted with MRSA bacteremia.  Likely septic emboli.  Loud harsh 3 out of 6 holosystolic murmur.  Cardiology asked to reevaluate for transesophageal echocardiogram.  [name] is much more alert.  He is completely alert and oriented to person place time and situation.  He reports no difficulty swallowing and no loose teeth.  No bleeding in the stomach.  He is okay for transesophageal echocardiogram.  ROS:  All other ROS reviewed and negative. Pertinent positives noted in the HPI.     Inpatient Medications  Scheduled Meds: . amLODipine  10 mg Oral Daily  . calcium acetate  667 mg Oral TID WC  . calcium acetate  667 mg Oral With snacks  . Chlorhexidine Gluconate Cloth  6 each Topical Q0600  . cinacalcet  30 mg Oral Q breakfast  . cloNIDine  0.1 mg Transdermal Weekly  . hydrALAZINE  50 mg Oral BID  . metoprolol tartrate  75 mg Oral BID   Continuous Infusions: . vancomycin Stopped (10/01/20 1921)   PRN Meds: acetaminophen **OR** acetaminophen (TYLENOL) oral liquid 160 mg/5 mL **OR** acetaminophen, albuterol, HYDROmorphone (DILAUDID) injection, ondansetron **OR** ondansetron (ZOFRAN) IV, oxyCODONE, polyethylene glycol   Vital Signs   Vitals:   10/02/20 1936 10/02/20 2319 10/03/20 0336 10/03/20 0800  BP: 118/78 128/75 (!) 126/91 (!) 131/92  Pulse: 74 72 74 76  Resp: '20 16 14 15  '$ Temp: 97.6 F (36.4 C) 98 F (36.7 C) 98.4 F (36.9 C) 98.4 F (36.9 C)  TempSrc: Axillary Oral Axillary Oral  SpO2: 99% 98% 99% 99%  Weight:      Height:        Intake/Output Summary (Last 24 hours) at 10/03/2020 1049 Last data filed at 10/03/2020 0900 Gross per 24 hour  Intake 1407.62 ml  Output --  Net 1407.62 ml   Last 3 Weights 10/01/2020 09/29/2020 09/27/2020  Weight (lbs) 176 lb 9.4 oz 185  lb 13.6 oz 185 lb  Weight (kg) 80.1 kg 84.3 kg 83.915 kg      Telemetry  Overnight telemetry shows sinus rhythm in the 80s, which I personally reviewed.   ECG  The most recent ECG shows sinus tachycardia heart rate 127, no acute ischemic changes, LVH by voltage, which I personally reviewed.   Physical Exam   Vitals:   10/02/20 1936 10/02/20 2319 10/03/20 0336 10/03/20 0800  BP: 118/78 128/75 (!) 126/91 (!) 131/92  Pulse: 74 72 74 76  Resp: '20 16 14 15  '$ Temp: 97.6 F (36.4 C) 98 F (36.7 C) 98.4 F (36.9 C) 98.4 F (36.9 C)  TempSrc: Axillary Oral Axillary Oral  SpO2: 99% 98% 99% 99%  Weight:      Height:         Intake/Output Summary (Last 24 hours) at 10/03/2020 1049 Last data filed at 10/03/2020 0900 Gross per 24 hour  Intake 1407.62 ml  Output --  Net 1407.62 ml    Last 3 Weights 10/01/2020 09/29/2020 09/27/2020  Weight (lbs) 176 lb 9.4 oz 185 lb 13.6 oz 185 lb  Weight (kg) 80.1 kg 84.3 kg 83.915 kg    Body mass index is 23.95 kg/m.   General: Well nourished, well developed, in no acute distress Head: Atraumatic, normal size  Eyes: PEERLA, EOMI  Neck: Supple, no JVD Endocrine: No thryomegaly Cardiac: Normal S1, S2; RRR;  3 out of 6 harsh holosystolic murmur Lungs: Clear to auscultation bilaterally, no wheezing, rhonchi or rales  Abd: Soft, nontender, no hepatomegaly  Ext: No edema, pulses 2+ Musculoskeletal: No deformities, BUE and BLE strength normal and equal Skin: Warm and dry, no rashes   Neuro: Alert and oriented to person, place, time, and situation, CNII-XII grossly intact, no focal deficits  Psych: Normal mood and affect   Labs  High Sensitivity Troponin:  No results for input(s): TROPONINIHS in the last 720 hours.   Cardiac EnzymesNo results for input(s): TROPONINI in the last 168 hours. No results for input(s): TROPIPOC in the last 168 hours.  Chemistry Recent Labs  Lab 10/01/20 0818 10/02/20 0315 10/03/20 0242  NA 138 135 133*  K 4.6 4.1 3.9   CL 96* 96* 91*  CO2 21* 23 23  GLUCOSE 94 115* 123*  BUN 103* 54* 71*  CREATININE 11.05* 7.00* 8.93*  CALCIUM 8.5* 8.6* 8.2*  PROT 7.7 7.8 7.3  ALBUMIN 2.4* 2.3* 2.2*  AST '27 25 19  '$ ALT '14 13 11  '$ ALKPHOS 80 78 75  BILITOT 1.6* 1.7* 1.1  GFRNONAA 5* 9* 7*  ANIONGAP 21* 16* 19*    Hematology Recent Labs  Lab 10/01/20 0818 10/02/20 0315 10/03/20 0242  WBC 38.5* 32.2* 31.1*  RBC 3.41* 3.41* 3.24*  HGB 9.1* 9.5* 8.6*  HCT 27.5* 27.6* 26.5*  MCV 80.6 80.9 81.8  MCH 26.7 27.9 26.5  MCHC 33.1 34.4 32.5  RDW 15.7* 15.8* 16.1*  PLT 191 185 294   BNPNo results for input(s): BNP, PROBNP in the last 168 hours.  DDimer No results for input(s): DDIMER in the last 168 hours.   Radiology  No results found.  Cardiac Studies  TTE 09/28/2020 1. Left ventricular ejection fraction, by estimation, is 55 to 60%. The  left ventricle has normal function. The left ventricle has no regional  wall motion abnormalities. There is moderate concentric left ventricular  hypertrophy. Left ventricular  diastolic parameters are consistent with Grade II diastolic dysfunction  (pseudonormalization).  2. Right ventricular systolic function is normal. The right ventricular  size is normal. There is normal pulmonary artery systolic pressure.  3. Left atrial size was moderately dilated.  4. Right atrial size was mild to moderately dilated.  5. The mitral valve is normal in structure. Mild to moderate mitral valve  regurgitation. No evidence of mitral stenosis. Moderate to severe mitral  annular calcification.  6. The aortic valve is tricuspid. Aortic valve regurgitation is trivial.  No aortic stenosis is present.  7. The inferior vena cava is normal in size with greater than 50%  respiratory variability, suggesting right atrial pressure of 3 mmHg.  8. Agitated saline contrast bubble study was negative, with no evidence  of any interatrial shunt.   Patient Profile  Patrick Anthony is a 50  y.o. male with ESRD on hemodialysis, hypertension, OSA, diastolic heart failure, liver cirrhosis due to hepatitis C who was admitted with generalized weakness and found to have new onset aphasia with embolic strokes.  Blood cultures positive for MRSA.  Cardiology has been consulted for transesophageal echocardiogram.  Assessment & Plan   1. MRSA Bacteremia/Endocarditis -Admitted with embolic phenomenon.  MRSA positive bacteremia.  I reviewed his echocardiogram.  He has new splay artifact.  He is a loud holosystolic murmur.  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  I reviewed his echocardiogram from March 2021.  He had no evidence of mitral regurgitation.  His murmur appears to be  new as well. -Mentation is clear.  No difficulty swallowing.  He is appropriate for transesophageal echocardiogram tomorrow.  We will arrange this.  Please keep him n.p.o. at midnight.  For questions or updates, please contact [place] Please consult [email or web address] for contact info under   Time Spent with Patient: I have spent a total of 25 minutes with patient reviewing hospital notes, telemetry, EKGs, labs and examining the patient as well as establishing an assessment and plan that was discussed with the patient.  > 50% of time was spent in direct patient care.    Signed, Addison Naegeli. Audie Box, Keith  10/03/2020 10:49 AM

## 2020-10-03 NOTE — Progress Notes (Signed)
[facility] Progress Note  Subjective: no new c/o today  Vitals:   10/02/20 2319 10/03/20 0336 10/03/20 0800 10/03/20 1200  BP: 128/75 (!) 126/91 (!) 131/92 122/88  Pulse: 72 74 76 75  Resp: '16 14 15 20  '$ Temp: 98 F (36.7 C) 98.4 F (36.9 C) 98.4 F (36.9 C) 98.4 F (36.9 C)  TempSrc: Oral Axillary Oral Oral  SpO2: 98% 99% 99% 98%  Weight:      Height:        Exam:   Gen - alert, conversant  no jvd  Chest cta bilat  Cor reg no RG  Abd soft ntnd no ascites   Ext no LE edema   Alert, NF, ox3   LUA AVG +bruit     [facility] HD: [facility]   4h  350/500  82.5kg  2/2.5 bath  P1  Hep 5500  LUA AVG   Aranesp 48mg q2wks - last 09/27/20   Hectorol 318m IV qHD    Assessment/ Plan: 1. MRSA bacteremia: w/ fevers and AMS.  Suspected endocarditis w/ MRI + for embolic CVA's. Has AVG LUA w/o gross signs of infection. Getting IV vanc. Per cardiology prob has MR endocarditis, they are planning a TEE tomorrow 2/28.  2. Acute CVA - multiple by MRI, embolic 3. AMS - due to infection +/- aphasia from CVA's. Resolved, Ox 3.  4. ESRD - usual HD MWF.  Next HD Monday.  5. Hypertension/volume  - Blood pressure chronically hard to control, on 4 bp lowering meds here. BP's wnl. Wt is under 1-2kg. 1-2 L goal w/ next HD.  6. Anemia of CKD - Hgb 9.3.  Last esa at OP HD was 2/21, next due 3/7 have ordered.   7. Secondary Hyperparathyroidism -  Ca in goal, phos 5- 7.  Continue VDRA, sensipar and binders 8. Nutrition - Renal diet w/fluid restriction  9. Hx Hep C /liver cirrhosis   Patrick Anthony 10/03/2020, 2:36 PM   Recent Labs  Lab 10/02/20 0315 10/03/20 0242  K 4.1 3.9  BUN 54* 71*  CREATININE 7.00* 8.93*  CALCIUM 8.6* 8.2*  PHOS 5.3* 6.0*  HGB 9.5* 8.6*   Inpatient medications: . amLODipine  10 mg Oral Daily  . calcium acetate  667 mg Oral TID WC  . calcium acetate  667 mg Oral With snacks  . Chlorhexidine Gluconate Cloth  6 each Topical Q0600  . cinacalcet  30 mg Oral Q  breakfast  . cloNIDine  0.1 mg Transdermal Weekly  . hydrALAZINE  50 mg Oral BID  . metoprolol tartrate  75 mg Oral BID   . vancomycin Stopped (10/01/20 1921)   acetaminophen **OR** acetaminophen (TYLENOL) oral liquid 160 mg/5 mL **OR** acetaminophen, albuterol, HYDROmorphone (DILAUDID) injection, ondansetron **OR** ondansetron (ZOFRAN) IV, oxyCODONE, polyethylene glycol

## 2020-10-04 DIAGNOSIS — I5032 Chronic diastolic (congestive) heart failure: Secondary | ICD-10-CM | POA: Diagnosis not present

## 2020-10-04 DIAGNOSIS — J189 Pneumonia, unspecified organism: Secondary | ICD-10-CM

## 2020-10-04 DIAGNOSIS — I6310 Cerebral infarction due to embolism of unspecified precerebral artery: Secondary | ICD-10-CM | POA: Diagnosis not present

## 2020-10-04 DIAGNOSIS — I132 Hypertensive heart and chronic kidney disease with heart failure and with stage 5 chronic kidney disease, or end stage renal disease: Secondary | ICD-10-CM | POA: Diagnosis not present

## 2020-10-04 DIAGNOSIS — B9561 Methicillin susceptible Staphylococcus aureus infection as the cause of diseases classified elsewhere: Secondary | ICD-10-CM | POA: Diagnosis not present

## 2020-10-04 DIAGNOSIS — R7881 Bacteremia: Secondary | ICD-10-CM | POA: Diagnosis not present

## 2020-10-04 DIAGNOSIS — I129 Hypertensive chronic kidney disease with stage 1 through stage 4 chronic kidney disease, or unspecified chronic kidney disease: Secondary | ICD-10-CM | POA: Diagnosis not present

## 2020-10-04 DIAGNOSIS — A419 Sepsis, unspecified organism: Secondary | ICD-10-CM | POA: Diagnosis not present

## 2020-10-04 DIAGNOSIS — R531 Weakness: Secondary | ICD-10-CM | POA: Diagnosis not present

## 2020-10-04 DIAGNOSIS — R0602 Shortness of breath: Secondary | ICD-10-CM | POA: Diagnosis not present

## 2020-10-04 DIAGNOSIS — Z992 Dependence on renal dialysis: Secondary | ICD-10-CM | POA: Diagnosis not present

## 2020-10-04 DIAGNOSIS — D631 Anemia in chronic kidney disease: Secondary | ICD-10-CM | POA: Diagnosis not present

## 2020-10-04 DIAGNOSIS — I6340 Cerebral infarction due to embolism of unspecified cerebral artery: Secondary | ICD-10-CM | POA: Diagnosis not present

## 2020-10-04 DIAGNOSIS — N186 End stage renal disease: Secondary | ICD-10-CM | POA: Diagnosis not present

## 2020-10-04 DIAGNOSIS — I639 Cerebral infarction, unspecified: Secondary | ICD-10-CM | POA: Diagnosis not present

## 2020-10-04 LAB — CBC WITH DIFFERENTIAL/PLATELET
Abs Immature Granulocytes: 0 10*3/uL (ref 0.00–0.07)
Basophils Absolute: 0 10*3/uL (ref 0.0–0.1)
Basophils Relative: 0 %
Eosinophils Absolute: 0 10*3/uL (ref 0.0–0.5)
Eosinophils Relative: 0 %
HCT: 25.3 % — ABNORMAL LOW (ref 39.0–52.0)
Hemoglobin: 8.7 g/dL — ABNORMAL LOW (ref 13.0–17.0)
Lymphocytes Relative: 4 %
Lymphs Abs: 1 10*3/uL (ref 0.7–4.0)
MCH: 27.6 pg (ref 26.0–34.0)
MCHC: 34.4 g/dL (ref 30.0–36.0)
MCV: 80.3 fL (ref 80.0–100.0)
Monocytes Absolute: 1 10*3/uL (ref 0.1–1.0)
Monocytes Relative: 4 %
Neutro Abs: 23.9 10*3/uL — ABNORMAL HIGH (ref 1.7–7.7)
Neutrophils Relative %: 92 %
Platelets: 217 10*3/uL (ref 150–400)
RBC: 3.15 MIL/uL — ABNORMAL LOW (ref 4.22–5.81)
RDW: 16.5 % — ABNORMAL HIGH (ref 11.5–15.5)
WBC: 26 10*3/uL — ABNORMAL HIGH (ref 4.0–10.5)
nRBC: 0.1 % (ref 0.0–0.2)
nRBC: 1 /100 WBC — ABNORMAL HIGH

## 2020-10-04 LAB — COMPREHENSIVE METABOLIC PANEL
ALT: 12 U/L (ref 0–44)
AST: 22 U/L (ref 15–41)
Albumin: 2.2 g/dL — ABNORMAL LOW (ref 3.5–5.0)
Alkaline Phosphatase: 75 U/L (ref 38–126)
Anion gap: 18 — ABNORMAL HIGH (ref 5–15)
BUN: 93 mg/dL — ABNORMAL HIGH (ref 6–20)
CO2: 23 mmol/L (ref 22–32)
Calcium: 8 mg/dL — ABNORMAL LOW (ref 8.9–10.3)
Chloride: 91 mmol/L — ABNORMAL LOW (ref 98–111)
Creatinine, Ser: 10.74 mg/dL — ABNORMAL HIGH (ref 0.61–1.24)
GFR, Estimated: 5 mL/min — ABNORMAL LOW (ref >60)
Glucose, Bld: 119 mg/dL — ABNORMAL HIGH (ref 70–99)
Potassium: 4.2 mmol/L (ref 3.5–5.1)
Sodium: 132 mmol/L — ABNORMAL LOW (ref 135–145)
Total Bilirubin: 1.2 mg/dL (ref 0.3–1.2)
Total Protein: 7.4 g/dL (ref 6.5–8.1)

## 2020-10-04 LAB — FOLATE: Folate: 11.1 ng/mL (ref >5.9)

## 2020-10-04 LAB — PROTIME-INR
INR: 1.1 (ref 0.8–1.2)
Prothrombin Time: 14.2 seconds (ref 11.4–15.2)

## 2020-10-04 LAB — PHOSPHORUS: Phosphorus: 7 mg/dL — ABNORMAL HIGH (ref 2.5–4.6)

## 2020-10-04 LAB — MAGNESIUM: Magnesium: 2.8 mg/dL — ABNORMAL HIGH (ref 1.7–2.4)

## 2020-10-04 LAB — FERRITIN: Ferritin: 1450 ng/mL — ABNORMAL HIGH (ref 24–336)

## 2020-10-04 LAB — IRON AND TIBC
Iron: 29 ug/dL — ABNORMAL LOW (ref 45–182)
Saturation Ratios: 16 % — ABNORMAL LOW (ref 17.9–39.5)
TIBC: 185 ug/dL — ABNORMAL LOW (ref 250–450)
UIBC: 156 ug/dL

## 2020-10-04 LAB — VITAMIN B12: Vitamin B-12: 437 pg/mL (ref 180–914)

## 2020-10-04 LAB — VANCOMYCIN, RANDOM: Vancomycin Rm: 41

## 2020-10-04 MED ORDER — DOXERCALCIFEROL 4 MCG/2ML IV SOLN
INTRAVENOUS | Status: AC
  Filled 2020-10-04: qty 2

## 2020-10-04 MED ORDER — VANCOMYCIN HCL IN DEXTROSE 750-5 MG/150ML-% IV SOLN: 750.0000 mg | INTRAVENOUS | Status: DC

## 2020-10-04 MED ORDER — HEPARIN SODIUM (PORCINE) 1000 UNIT/ML DIALYSIS: 2000.0000 [IU] | INTRAMUSCULAR | Status: DC | PRN

## 2020-10-04 MED ORDER — DOXERCALCIFEROL 4 MCG/2ML IV SOLN
3.0000 ug | INTRAVENOUS | Status: DC
  Administered 2020-10-04 – 2020-10-13 (×5): 3 ug via INTRAVENOUS
  Filled 2020-10-04 (×4): qty 2

## 2020-10-04 NOTE — Progress Notes (Signed)
Pharmacy Antibiotic Note  Patrick Anthony is a 50 y.o. male admitted on 09/27/2020 with fever, chills, and generalized weakness.  P found to have MRSA bacteremia with concerns for possible infective endocaarditis (TEE pending).  Patient continues on Vancomycin per pharmacy, abx d #8.  Pt has ESRD with HD MWF.  Pre-HD Vancomycin level 41.  Based on planned 3hr HD session, estimate post-HD level of 27 (goal 15-25).  Plan: No Vancomycin after HD 2/28 due to elevated level.   Communicated this information with HD RN. Resume Vancomycin at reduced dose of '750mg'$  IV MWF with next HD 3/2.  Height: 6' (182.9 cm) Weight: 81 kg (178 lb 9.2 oz) IBW/kg (Calculated) : 77.6  Temp (24hrs), Avg:98.3 F (36.8 C), Min:97.5 F (36.4 C), Max:98.6 F (37 C)  Recent Labs  Lab 09/27/20 1416 09/27/20 1900 09/28/20 0436 09/29/20 1301 09/30/20 0610 10/01/20 0818 10/02/20 0315 10/03/20 0242 10/04/20 0211  WBC  --   --  29.6*   < > 41.6* 38.5* 32.2* 31.1* 26.0*  CREATININE 10.30*  --  11.10*   < > 8.63* 11.05* 7.00* 8.93* 10.74*  LATICACIDVEN 3.5* 1.7 1.1  --   --   --   --   --   --   VANCORANDOM  --   --   --   --   --   --   --   --  41   < > = values in this interval not displayed.    Estimated Creatinine Clearance: 9.1 mL/min (A) (by C-G formula based on SCr of 10.74 mg/dL (H)).    No Known Allergies  Antimicrobials this admission: Vanc 2/21>> Cefepime 2/21>>2/22 Flagyl 2/21>>2/22 (2 doses given)  Dose adjustments this admission: 2/28 Pre-HD VR 41 - estimate post HD level 27 Dose held 2/28 and reduced to '750mg'$  IV qHD starting 3/2  Microbiology results: 2/21 blood x 2: MRSA, sens Vanc MIC < 0.5, Septra, TCN, gent MIC < 0.5, Clinda 2/22 BCID: MRSA  2/21 COVID and flu: negative 2/21 HIV: non-reactive 2/24 blood x 2: 1/2 SA 2/27 blood x 2: pending  Thank you for allowing pharmacy to be a part of this patient's care.  [employer], Pharm.D., BCPS Clinical Pharmacist Clinical phone for  10/04/2020 from 8:30-4:00 is [phone].  **Pharmacist phone directory can be found on [email or web address] listed under [place].  10/04/2020 9:04 AM

## 2020-10-04 NOTE — Progress Notes (Addendum)
Initial Nutrition Assessment  DOCUMENTATION CODES:   Not applicable  INTERVENTION:   -Nepro with Carb Steady BID, each supplement provides 420 kcal, 19 grams protein  -Rena-Vite  NUTRITION DIAGNOSIS:   Increased nutrient needs related to chronic illness (ESRD on HD) as evidenced by estimated needs.  GOAL:   Patient will meet greater than or equal to 90% of their needs  MONITOR:   PO intake,Supplement acceptance,Skin,I & O's,Diet advancement,Labs,Weight trends  REASON FOR ASSESSMENT:   Other (Comment) (poor po intake)    ASSESSMENT:   32 YOM admitted for pneumonia d/t infectious agent. PMH of cirrhosis of liver, drug abuse, ETOH abuse, anemia, CHF, Hepatitis C, HTN, ESRD on HD. Recent onset of sepsis d/t MRSA, acute CVA.  2/23 - EEG completed, SLP eval - recommendation for NPO 2/24 - SLP eval - recommendation for purees and thin liquids  Per chart meal documentation, pt has been consuming 0-70%. Pt reports that he has had a good appetite during his hospital stay and PTA. Pt reports that on his dialysis days he typically consumes:  Breakfast - cereal and milk Lunch - fruit  Dinner - trail mix, peanuts  Pt reports on non-dialysis days he typically consumes:  Breakfast - oatmeal or cereal and milk Lunch - fruit  Dinner - vegetables  Pt originally reported that he drank milk with each meal, however then noted that he drinks juice with each meal (apple, grape or orange juice). Pt mentioned that monthly at dialysis he receives a case of "[place] 800" supplements, however mentioned that he doesn't typically drink supplements. Unsure of nutrition history accuracy. Intern googled noted supplement which was a subwoofer. Pt reports that he does take his binders prior to his meal. Noted phosphorus trend remains high, pt reports that it's not well controlled as an outpatient as well. Attempted to review dietary sources of phosphorus, however reports are conflicting. Current diet recall  is very low in protein. RD discussed with pt the importance of protein and adding protein to each meal.   Pt and chart report an EDW: 82.5 kg. Per chart, no weight histories noted prior to 10/30/19. Noted current weight of 79 kg is less than reported EDW.   Pt reports that he is mobile at home and does not require any assistance.   Meds Reviewed: Phoslo (TID), Sensipar (daily), Hectorol (M,W,F) Labs Reviewed: Sodium (132, low), corrected calcium (9.44), Magnesium (2.8, high), Phosphorus (7, high)   NUTRITION - FOCUSED PHYSICAL EXAM:  Flowsheet Row Most Recent Value  Orbital Region No depletion  Upper Arm Region No depletion  Thoracic and Lumbar Region No depletion  Buccal Region No depletion  Temple Region No depletion  Clavicle Bone Region No depletion  Clavicle and Acromion Bone Region No depletion  Scapular Bone Region No depletion  Dorsal Hand No depletion  Patellar Region Moderate depletion  Anterior Thigh Region Moderate depletion  Posterior Calf Region Moderate depletion  Edema (RD Assessment) Mild  [generalized edema]  Hair Reviewed  Eyes Reviewed  Mouth Reviewed  [poor dentition]  Skin Reviewed  Nails Reviewed       Diet Order:   Diet Order            Diet NPO time specified  Diet effective midnight           Diet renal with fluid restriction Fluid restriction: 1200 mL Fluid; Room service appropriate? Yes; Fluid consistency: Thin  Diet effective now  EDUCATION NEEDS:   Education needs have been addressed (RD discussed with pt protein's importance and protein sources)  Skin:  Skin Assessment: Reviewed RN Assessment  Last BM:  2/26 (type 4)  Height:   Ht Readings from Last 1 Encounters:  09/27/20 6' (1.829 m)    Weight:   Wt Readings from Last 1 Encounters:  10/04/20 79 kg    Ideal Body Weight:  80.9 kg  BMI:  Body mass index is 23.62 kg/m.  Estimated Nutritional Needs:   Kcal:  2200-2400  Protein:  105-120 g  Fluid:   1.2 L   Salvadore Oxford, Dietetic Intern 10/04/2020 4:35 PM

## 2020-10-04 NOTE — Progress Notes (Signed)
PT Cancellation Note  Patient Details Name: Patrick Anthony MRN: [id number] DOB: 1971/02/10   Cancelled Treatment:    Reason Eval/Treat Not Completed: Patient at procedure or test/unavailable Pt leaving for HD. PT to return as able to progress mobility.  Kittie Plater, PT, DPT Acute Rehabilitation Services Pager #: [phone] Office #: [phone]    Berline Lopes 10/04/2020, 7:35 AM

## 2020-10-04 NOTE — Progress Notes (Signed)
PROGRESS NOTE    Patrick Anthony  [id number] DOB: [birth year] DOA: 09/27/2020 PCP: [name], MD  Chief Complaint  Patient presents with  . Generalized Body Aches    Brief Narrative:  50 year old male with medical history of end-stage renal disease due to APKD on hemodialysis MWF through left upper extremity AV graft, hypertension, OSA, chronic diastolic CHF, EF 55 to [id number], liver cirrhosis due to hepatitis C, history of alcohol abuse came to hospital with generalized weakness. Chest x-ray showed possible opacity at left lower lobe is concerning for pneumonia. CT abdomen/pelvis showed findings of adult polycystic kidney disease. Blood cultures now showing staph aureus.  ID is following.  Hospitalization complicated by embolic stroke with presumed endocarditis in setting of his bacteremia.  Assessment & Plan:   Principal Problem:   Pneumonia due to infectious agent Active Problems:   Chronic diastolic CHF (congestive heart failure) (HCC)   HTN (hypertension)   Anemia in chronic kidney disease   ESRD on dialysis ([place])   Sepsis ([place])   ADPKD (autosomal dominant polycystic kidney disease)   Sepsis due to pneumonia (HCC)   Lactic acidosis   Cerebral embolism with cerebral infarction   Bacteremia due to Staphylococcus aureus  1. New onset aphasia  Scattered Small Acute Infarcts within Bilateral Cerebral Hemispheres, Right Basal Ganglia, Callosal Splenium, and Bilateral Cerebellar Hemispheres  Concerning for Embolic Process: 1. Concerning for embolic stroke from endocarditis in setting of below 2. TEE likely for 3/1, NPO at [facility] 3. Echo with EF 55-60%, grade II diastolic dysfunction (no intracardiac source of embolism detected on transthoracic study) 4. MRA head/neck without appreciable stenosis in common carotid, internal carotid, vertebral arteries.  No intracranial LVO or proximal high grade arterial stenosis.  5. HDL <10, LDL not calculated - A1c 5.5 6. Holding  anitplatelets/anticoagulants at this time until further w/u to rule out endocarditis and mycotic aneurysms 7. Neurology consult, appreciate recommendations  2. MRSA Bacteremia  Presumed Endocarditis  Possible Pneumonia 1. Presumed endocarditis in setting of his embolic stroke above 2. TEE pending (as above) 3. Given AMS, needs LP as well - deferred at this time per neurology 4. 2/21 cx with MRSA bacteremia - sensitive to vancomycin 5. Repeat blood cultures 2/24 (1/2 sets persistently positive with staph aureus) 6. Repeat blood cx 2/27 pending 7. CT abdomen pelvis without contrast on presentation with findings c/w PCKD, equivocal thickening of urinary bladder, no abscess in abdomen/pelvis (see report) 8. CXR 2/21 with streaky L basilar opacities 9. No thoracolumbar discitis-osteomyelitis or epidural collection (see imaging result) 2/24 10. CT chest abdomen pelvis 2/23 with cavitary mass in LUL apex (2/2 cavitary infection vs septic embolus), small foci of consolidation and ground glass density in RUL and small ground glass nodule in LUL (infectious vs inflammatory), polycystic kidney disease, no CT evidence for acute intraabdominal or pelvic abnormality.  Calcified mass anterior to R shoulder with smller L paraspinal soft tissue calcification probably due to calcinosis. 11. Continue vancomycin per ID 12. Persistent leukocytosis - improving  3. Acute Metabolic Encephalopathy 1. Likely related to infection above -> seems to be gradually improving today 2. Likely would benefit from LP - per neurology  4. Seizure Like Activity of Upper Extremities 1. LTM EEG per neurology - mod diffuse encephalopathy  2. 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 ESRD due to adult polycystic kidney disease  Hyperkalemia  AGMA-patient gets dialysis MWF through left upper extremity fistula, continue PhosLo for hyperphosphatemia, Sensipar for secondary hyperparathyroidism.  1. Nephrology  following.  6. Anemia of chronic disease  Iron Def Anemia 1. Relatively stable -> labs c/w aocd and iron def 2. Continue to monitor.  3. Patient is on ESA as outpatient.  7. Hypertension  1. Clonidine patch while not consistently taking PO -> BP's ok for now, will need to transition back to PO clonidine  2. BP improved 3. IV hydralazine prn 4. Resume PO metoprolol, amlodipine, hydral  8. Sinus Tachycardia 1. 2/2 pain vs rebound tachycardia with metop on hold vs worsening infection (with worsening leukocytosis) 2. Improved, continue metop  9. Liver cirrhosis due to hepatitis C-patient has chronic HCV infection, HIV Shelda Pal was nonreactive during prior hospital stay. 1. Elevated INR - imaging does not comment on cirrhosis or focal liver abnormality - no biliary dilatation - follow  10. Abnormal Chest CT 1. Short interval follow up recommended  11. Thrombocytopenia 1. resolved  DVT prophylaxis: SCD Code Status: full  Family Communication: none at bedside - no answer daughter 2/27 Disposition:   Status is: Inpatient  Remains inpatient appropriate because:Inpatient level of care appropriate due to severity of illness   Dispo: The patient is from: Home              Anticipated d/c is to: pending              Anticipated d/c date is: > 3 days              Patient currently is not medically stable to d/c.   Difficult to place patient No  Consultants:   Renal  ID  Neurology  cardiology  Procedures:  TTE IMPRESSIONS    1. Left ventricular ejection fraction, by estimation, is 55 to 60%. The  left ventricle has normal function. The left ventricle has no regional  wall motion abnormalities. There is moderate concentric left ventricular  hypertrophy. Left ventricular  diastolic parameters are consistent with Grade II diastolic dysfunction  (pseudonormalization).  2. Right ventricular systolic function is normal. The right ventricular  size is normal. There is normal  pulmonary artery systolic pressure.  3. Left atrial size was moderately dilated.  4. Right atrial size was mild to moderately dilated.  5. The mitral valve is normal in structure. Mild to moderate mitral valve  regurgitation. No evidence of mitral stenosis. Moderate to severe mitral  annular calcification.  6. The aortic valve is tricuspid. Aortic valve regurgitation is trivial.  No aortic stenosis is present.  7. The inferior vena cava is normal in size with greater than 50%  respiratory variability, suggesting right atrial pressure of 3 mmHg.  8. Agitated saline contrast bubble study was negative, with no evidence  of any interatrial shunt.   Conclusion(s)/Recommendation(s): No intracardiac source of embolism  detected on this transthoracic study. Lela Gell transesophageal echocardiogram is  recommended to exclude cardiac source of embolism if clinically indicated.  Antimicrobials: Anti-infectives (From admission, onward)   Start     Dose/Rate Route Frequency Ordered Stop   10/01/20 2000  DAPTOmycin (CUBICIN) 840 mg in sodium chloride 0.9 % IVPB  Status:  Discontinued        840 mg 233.6 mL/hr over 30 Minutes Intravenous Every 48 hours 10/01/20 0922 10/01/20 1056   10/01/20 1200  vancomycin (VANCOCIN) IVPB 1000 mg/200 mL premix  Status:  Discontinued        1,000 mg 200 mL/hr over 60 Minutes Intravenous Every M-W-F (Hemodialysis) 10/01/20 1056 10/04/20 0845   09/29/20 1200  vancomycin (VANCOCIN) IVPB 1000 mg/200 mL premix  Status:  Discontinued        1,000 mg 200 mL/hr over 60 Minutes Intravenous Every M-W-F (Hemodialysis) 09/27/20 1443 10/01/20 0922   09/29/20 1200  ceFEPIme (MAXIPIME) 2 g in sodium chloride 0.9 % 100 mL IVPB  Status:  Discontinued        2 g 200 mL/hr over 30 Minutes Intravenous Every M-W-F (Hemodialysis) 09/27/20 1443 09/28/20 1054   09/28/20 0900  metroNIDAZOLE (FLAGYL) IVPB 500 mg  Status:  Discontinued        500 mg 100 mL/hr over 60 Minutes Intravenous Every  12 hours 09/27/20 1808 09/28/20 1054   09/28/20 0900  ceFEPIme (MAXIPIME) 1 g in sodium chloride 0.9 % 100 mL IVPB  Status:  Discontinued        1 g 200 mL/hr over 30 Minutes Intravenous Every 12 hours 09/27/20 1808 09/27/20 1812   09/27/20 1445  vancomycin (VANCOREADY) IVPB 2000 mg/400 mL        2,000 mg 200 mL/hr over 120 Minutes Intravenous  Once 09/27/20 1433 09/27/20 2330   09/27/20 1430  ceFEPIme (MAXIPIME) 2 g in sodium chloride 0.9 % 100 mL IVPB        2 g 200 mL/hr over 30 Minutes Intravenous  Once 09/27/20 1418 09/27/20 1541   09/27/20 1430  metroNIDAZOLE (FLAGYL) IVPB 500 mg        500 mg 100 mL/hr over 60 Minutes Intravenous  Once 09/27/20 1418 09/27/20 2214   09/27/20 1430  vancomycin (VANCOREADY) IVPB 1750 mg/350 mL  Status:  Discontinued        1,750 mg 175 mL/hr over 120 Minutes Intravenous  Once 09/27/20 1418 09/27/20 1433     Subjective: No new complaints today, asking to sit up during dialysis  Objective: Vitals:   10/04/20 0745 10/04/20 0750 10/04/20 0800 10/04/20 0830  BP: 130/75 124/83 (!) 128/91 (!) 141/86  Pulse: 71 71 71 73  Resp: (!) 21     Temp: (!) 97.5 F (36.4 C)     TempSrc: Oral     SpO2: 97%     Weight:      Height:        Intake/Output Summary (Last 24 hours) at 10/04/2020 0859 Last data filed at 10/03/2020 1656 Gross per 24 hour  Intake 1126 ml  Output --  Net 1126 ml   Filed Weights   09/29/20 1240 10/01/20 1200 10/04/20 0740  Weight: 84.3 kg 80.1 kg 81 kg    Examination:  General: No acute distress. Cardiovascular: Heart sounds show Tollie Canada regular rate, and rhythm.  Systolic murmur over apex. Lungs: Clear to auscultation bilaterally  Abdomen: Soft, nontender, nondistended Neurological: Alert and oriented 3. Moves all extremities 4. Cranial nerves II through XII grossly intact. Skin: Warm and dry. No rashes or lesions. Extremities: No clubbing or cyanosis. No edema.   Data Reviewed: I have personally reviewed following labs and  imaging studies  CBC: Recent Labs  Lab 09/30/20 0610 10/01/20 0818 10/02/20 0315 10/03/20 0242 10/04/20 0211  WBC 41.6* 38.5* 32.2* 31.1* 26.0*  NEUTROABS 37.0* 31.6* 29.3* 28.0* 23.9*  HGB 10.4* 9.1* 9.5* 8.6* 8.7*  HCT 32.0* 27.5* 27.6* 26.5* 25.3*  MCV 81.6 80.6 80.9 81.8 80.3  PLT 149* 191 185 294 [id number]    Basic Metabolic Panel: Recent Labs  Lab 09/30/20 0610 10/01/20 0818 10/02/20 0315 10/03/20 0242 10/04/20 0211  NA 136 138 135 133* 132*  K 4.9 4.6 4.1 3.9 4.2  CL 95* 96* 96* 91* 91*  CO2 22  21* '23 23 23  '$ GLUCOSE 120* 94 115* 123* 119*  BUN 69* 103* 54* 71* 93*  CREATININE 8.63* 11.05* 7.00* 8.93* 10.74*  CALCIUM 8.6* 8.5* 8.6* 8.2* 8.0*  MG 2.6* 3.1* 2.5* 2.6* 2.8*  PHOS 7.7* 7.5* 5.3* 6.0* 7.0*    GFR: Estimated Creatinine Clearance: 9.1 mL/min (Keyonni Percival) (by C-G formula based on SCr of 10.74 mg/dL (H)).  Liver Function Tests: Recent Labs  Lab 09/30/20 0610 10/01/20 0818 10/02/20 0315 10/03/20 0242 10/04/20 0211  AST 38 '27 25 19 22  '$ ALT '15 14 13 11 12  '$ ALKPHOS 92 80 78 75 75  BILITOT 1.5* 1.6* 1.7* 1.1 1.2  PROT 8.0 7.7 7.8 7.3 7.4  ALBUMIN 2.4* 2.4* 2.3* 2.2* 2.2*    CBG: Recent Labs  Lab 09/28/20 0721 09/28/20 0928 10/02/20 1124  GLUCAP 92 103* 122*     Recent Results (from the past 240 hour(s))  Blood Culture (routine x 2)     Status: Abnormal   Collection Time: 09/27/20  2:16 PM   Specimen: BLOOD RIGHT HAND  Result Value Ref Range Status   Specimen Description BLOOD RIGHT HAND  Final   Special Requests   Final    BOTTLES DRAWN AEROBIC AND ANAEROBIC Blood Culture results may not be optimal due to an inadequate volume of blood received in culture bottles Performed at [facility] [address] [place], [place] [address]    Culture  Setup Time   Final    GRAM POSITIVE COCCI IN CLUSTERS IN BOTH AEROBIC AND ANAEROBIC BOTTLES    Culture METHICILLIN RESISTANT STAPHYLOCOCCUS AUREUS (Zanna Hawn)  Final   Report Status 09/30/2020 FINAL  Final    Organism ID, Bacteria METHICILLIN RESISTANT STAPHYLOCOCCUS AUREUS  Final      Susceptibility   Methicillin resistant staphylococcus aureus - MIC*    CIPROFLOXACIN >=8 RESISTANT Resistant     ERYTHROMYCIN >=8 RESISTANT Resistant     GENTAMICIN <=0.5 SENSITIVE Sensitive     OXACILLIN >=4 RESISTANT Resistant     TETRACYCLINE <=1 SENSITIVE Sensitive     VANCOMYCIN <=0.5 SENSITIVE Sensitive     TRIMETH/SULFA <=10 SENSITIVE Sensitive     CLINDAMYCIN <=0.25 SENSITIVE Sensitive     RIFAMPIN <=0.5 SENSITIVE Sensitive     Inducible Clindamycin NEGATIVE Sensitive     * METHICILLIN RESISTANT STAPHYLOCOCCUS AUREUS  Resp Panel by RT-PCR (Flu Latashia Koch&B, Covid) Nasopharyngeal Swab     Status: None   Collection Time: 09/27/20  2:16 PM   Specimen: Nasopharyngeal Swab; Nasopharyngeal(NP) swabs in vial transport medium  Result Value Ref Range Status   SARS Coronavirus 2 by RT PCR NEGATIVE NEGATIVE Final    Comment: (NOTE) SARS-CoV-2 target nucleic acids are NOT DETECTED.  The SARS-CoV-2 RNA is generally detectable in upper respiratory specimens during the acute phase of infection. The lowest concentration of SARS-CoV-2 viral copies this assay can detect is 138 copies/mL. Caelum Federici negative result does not preclude SARS-Cov-2 infection and should not be used as the sole basis for treatment or other patient management decisions. Cephas Revard negative result may occur with  improper specimen collection/handling, submission of specimen other than nasopharyngeal swab, presence of viral mutation(s) within the areas targeted by this assay, and inadequate number of viral copies(<138 copies/mL). Mashayla Lavin negative result must be combined with clinical observations, patient history, and epidemiological information. The expected result is Negative.  Fact Sheet for Patients:  EntrepreneurPulse.com.au  Fact Sheet for Healthcare Providers:  IncredibleEmployment.be  This test is no t yet approved or  cleared by  the [name] and  has been authorized for detection and/or diagnosis of SARS-CoV-2 by FDA under an Emergency Use Authorization (EUA). This EUA will remain  in effect (meaning this test can be used) for the duration of the COVID-19 declaration under Section 564(b)(1) of the Act, 21 U.S.C.section 360bbb-3(b)(1), unless the authorization is terminated  or revoked sooner.       Influenza Karrisa Didio by PCR NEGATIVE NEGATIVE Final   Influenza B by PCR NEGATIVE NEGATIVE Final    Comment: (NOTE) The Xpert Xpress SARS-CoV-2/FLU/RSV plus assay is intended as an aid in the diagnosis of influenza from Nasopharyngeal swab specimens and should not be used as Isaiahs Chancy sole basis for treatment. Nasal washings and aspirates are unacceptable for Xpert Xpress SARS-CoV-2/FLU/RSV testing.  Fact Sheet for Patients: EntrepreneurPulse.com.au  Fact Sheet for Healthcare Providers: IncredibleEmployment.be  This test is not yet approved or cleared by the [place] FDA and has been authorized for detection and/or diagnosis of SARS-CoV-2 by FDA under an Emergency Use Authorization (EUA). This EUA will remain in effect (meaning this test can be used) for the duration of the COVID-19 declaration under Section 564(b)(1) of the Act, 21 U.S.C. section 360bbb-3(b)(1), unless the authorization is terminated or revoked.  Performed at [facility], [place] [address], [place], [place] [address]   Blood Culture ID Panel (Reflexed)     Status: Abnormal   Collection Time: 09/27/20  2:16 PM  Result Value Ref Range Status   Enterococcus faecalis NOT DETECTED NOT DETECTED Final   Enterococcus Faecium NOT DETECTED NOT DETECTED Final   Listeria monocytogenes NOT DETECTED NOT DETECTED Final   Staphylococcus species DETECTED (Gaberiel Youngblood) NOT DETECTED Final    Comment: CRITICAL RESULT CALLED TO, READ BACK BY AND VERIFIED WITH: C,PIERCE PHARMD '@1009'$  09/28/20 EB    Staphylococcus aureus  (BCID) DETECTED (Mikiya Nebergall) NOT DETECTED Final    Comment: Methicillin (oxacillin)-resistant Staphylococcus aureus (MRSA). MRSA is predictably resistant to beta-lactam antibiotics (except ceftaroline). Preferred therapy is vancomycin unless clinically contraindicated. Patient requires contact precautions if  hospitalized. CRITICAL RESULT CALLED TO, READ BACK BY AND VERIFIED WITH: C,PIERCE PHARMD '@1009'$  09/28/20 EB    Staphylococcus epidermidis NOT DETECTED NOT DETECTED Final   Staphylococcus lugdunensis NOT DETECTED NOT DETECTED Final   Streptococcus species NOT DETECTED NOT DETECTED Final   Streptococcus agalactiae NOT DETECTED NOT DETECTED Final   Streptococcus pneumoniae NOT DETECTED NOT DETECTED Final   Streptococcus pyogenes NOT DETECTED NOT DETECTED Final   Nicolai Labonte.calcoaceticus-baumannii NOT DETECTED NOT DETECTED Final   Bacteroides fragilis NOT DETECTED NOT DETECTED Final   Enterobacterales NOT DETECTED NOT DETECTED Final   Enterobacter cloacae complex NOT DETECTED NOT DETECTED Final   Escherichia coli NOT DETECTED NOT DETECTED Final   Klebsiella aerogenes NOT DETECTED NOT DETECTED Final   Klebsiella oxytoca NOT DETECTED NOT DETECTED Final   Klebsiella pneumoniae NOT DETECTED NOT DETECTED Final   Proteus species NOT DETECTED NOT DETECTED Final   Salmonella species NOT DETECTED NOT DETECTED Final   Serratia marcescens NOT DETECTED NOT DETECTED Final   Haemophilus influenzae NOT DETECTED NOT DETECTED Final   Neisseria meningitidis NOT DETECTED NOT DETECTED Final   Pseudomonas aeruginosa NOT DETECTED NOT DETECTED Final   Stenotrophomonas maltophilia NOT DETECTED NOT DETECTED Final   Candida albicans NOT DETECTED NOT DETECTED Final   Candida auris NOT DETECTED NOT DETECTED Final   Candida glabrata NOT DETECTED NOT DETECTED Final   Candida krusei NOT DETECTED NOT DETECTED Final   Candida parapsilosis NOT DETECTED NOT DETECTED Final  Candida tropicalis NOT DETECTED NOT DETECTED Final    Cryptococcus neoformans/gattii NOT DETECTED NOT DETECTED Final   Meth resistant mecA/C and MREJ DETECTED (Raveen Wieseler) NOT DETECTED Final    Comment: CRITICAL RESULT CALLED TO, READ BACK BY AND VERIFIED WITH: C,PIERCE PHARMD '@1009'$  09/28/20 EB Performed at [facility] [address] [place], [place] [address]   Blood Culture (routine x 2)     Status: Abnormal   Collection Time: 09/27/20  2:29 PM   Specimen: BLOOD  Result Value Ref Range Status   Specimen Description BLOOD SITE NOT SPECIFIED  Final   Special Requests   Final    BOTTLES DRAWN AEROBIC AND ANAEROBIC Blood Culture results may not be optimal due to an inadequate volume of blood received in culture bottles   Culture  Setup Time   Final    GRAM POSITIVE COCCI IN CLUSTERS ANAEROBIC BOTTLE ONLY CRITICAL VALUE NOTED.  VALUE IS CONSISTENT WITH PREVIOUSLY REPORTED AND CALLED VALUE.    Culture (Kamden Stanislaw)  Final    STAPHYLOCOCCUS AUREUS SUSCEPTIBILITIES PERFORMED ON PREVIOUS CULTURE WITHIN THE LAST 5 DAYS. Performed at [facility], [place] [address] [place], [place] [address]    Report Status 10/01/2020 FINAL  Final  Culture, blood (routine x 2)     Status: None (Preliminary result)   Collection Time: 09/30/20  6:04 AM   Specimen: BLOOD RIGHT FOREARM  Result Value Ref Range Status   Specimen Description BLOOD RIGHT FOREARM  Final   Special Requests   Final    BOTTLES DRAWN AEROBIC AND ANAEROBIC Blood Culture adequate volume   Culture   Final    NO GROWTH 3 DAYS Performed at [facility], [place] [address], [place], [place] [address]    Report Status PENDING  Incomplete  Culture, blood (routine x 2)     Status: Abnormal   Collection Time: 09/30/20  6:08 AM   Specimen: BLOOD RIGHT HAND  Result Value Ref Range Status   Specimen Description BLOOD RIGHT HAND  Final   Special Requests   Final    BOTTLES DRAWN AEROBIC AND ANAEROBIC Blood Culture results may not be optimal due to an inadequate volume of blood received in culture  bottles   Culture  Setup Time   Final    GRAM POSITIVE COCCI IN CLUSTERS ANAEROBIC BOTTLE ONLY CRITICAL VALUE NOTED.  VALUE IS CONSISTENT WITH PREVIOUSLY REPORTED AND CALLED VALUE.    Culture (Daphane Odekirk)  Final    STAPHYLOCOCCUS AUREUS SUSCEPTIBILITIES PERFORMED ON PREVIOUS CULTURE WITHIN THE LAST 5 DAYS. Performed at [facility], [place] [address], [place],  [address]    Report Status 10/03/2020 FINAL  Final         Radiology Studies: No results found.      Scheduled Meds: . amLODipine  10 mg Oral Daily  . calcium acetate  667 mg Oral TID WC  . calcium acetate  667 mg Oral With snacks  . Chlorhexidine Gluconate Cloth  6 each Topical Q0600  . cinacalcet  30 mg Oral Q breakfast  . cloNIDine  0.1 mg Transdermal Weekly  . [START ON 10/11/2020] darbepoetin (ARANESP) injection - DIALYSIS  60 mcg Intravenous Q14 Days  . hydrALAZINE  50 mg Oral BID  . metoprolol tartrate  75 mg Oral BID   Continuous Infusions:    LOS: 7 days    Time spent: over 30 min    Fayrene Helper, MD [facility]   To contact the attending provider  between 7A-7P or the covering provider during after hours 7P-7A, please log into the web site [email or web address] and access using universal  password for that web site. If you do not have the password, please call the hospital operator.  10/04/2020, 8:59 AM

## 2020-10-04 NOTE — Progress Notes (Signed)
Refuses to wear the SCD's and he took them off immediately after I put them on him and said he is not going to wear them.  I did explain the benefits and risks and he said "I do not care".  I did notify Dr Florene Glen.  We will continue to encourage him to wear them.

## 2020-10-04 NOTE — Progress Notes (Signed)
[facility] Progress Note  Subjective: no new c/o today- seen on HD- says word finding is better-  Due for TEE today   Vitals:   10/04/20 0800 10/04/20 0830 10/04/20 0900 10/04/20 0930  BP: (!) 128/91 (!) 141/86 (!) 135/99 (!) 133/103  Pulse: 71 73 85 83  Resp:      Temp:      TempSrc:      SpO2:      Weight:      Height:        Exam:   Gen - alert, conversant  no jvd  Chest cta bilat  Cor reg no RG  Abd soft ntnd no ascites   Ext no LE edema   Alert, NF, ox3   LUA AVG +bruit     OP HD: MWF [facility]   4h  350/500  82.5kg  2/2.5 bath  P1  Hep 5500  LUA AVG   Aranesp 74mg q2wks - last 09/27/20   Hectorol 374m IV qHD    Assessment/ Plan: 1. MRSA bacteremia: w/ fevers and AMS.  Suspected endocarditis w/ MRI + for embolic CVA's. Has AVG LUA w/o gross signs of infection. Getting IV vanc. 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 Acute CVA - multiple by MRI, embolic 3. AMS - due to infection +/- aphasia from CVA's. Resolved, Ox 3.  4. ESRD - usual HD MWF.  Next HD today via AVG.  5. Hypertension/volume  - Blood pressure chronically hard to control, on 4 bp lowering meds here. BP's wnl. Wt is under 1-2kg. 1-2 L goal w/ next HD- will need EDW adjust at discharge.  6. Anemia of CKD - Hgb 9.3.  Last esa at OP HD was 2/21, next due 3/7 have ordered.   7. Secondary Hyperparathyroidism -  Ca in goal, phos 5- 7.  Continue hectorol, sensipar and binders- phoslo 8. Nutrition - Renal diet w/fluid restriction  9. Hx Hep C /liver cirrhosis  KeLouis Meckel2/28/2022, 9:38 AM   Recent Labs  Lab 10/03/20 0242 10/04/20 0211  K 3.9 4.2  BUN 71* 93*  CREATININE 8.93* 10.74*  CALCIUM 8.2* 8.0*  PHOS 6.0* 7.0*  HGB 8.6* 8.7*   Inpatient medications: . amLODipine  10 mg Oral Daily  . calcium acetate  667 mg Oral TID WC  . calcium acetate  667 mg Oral With snacks  . Chlorhexidine Gluconate Cloth  6 each Topical Q0600  .  cinacalcet  30 mg Oral Q breakfast  . cloNIDine  0.1 mg Transdermal Weekly  . [START ON 10/11/2020] darbepoetin (ARANESP) injection - DIALYSIS  60 mcg Intravenous Q14 Days  . hydrALAZINE  50 mg Oral BID  . metoprolol tartrate  75 mg Oral BID   . [START ON 10/06/2020] vancomycin     acetaminophen **OR** acetaminophen (TYLENOL) oral liquid 160 mg/5 mL **OR** acetaminophen, albuterol, [START ON 10/05/2020] heparin, HYDROmorphone (DILAUDID) injection, ondansetron **OR** ondansetron (ZOFRAN) IV, oxyCODONE, polyethylene glycol

## 2020-10-04 NOTE — Procedures (Signed)
Patient was seen on dialysis and the procedure was supervised.  BFR 350  Via AVG BP is  133/103.   Patient appears to be tolerating treatment well  Louis Meckel 10/04/2020

## 2020-10-04 NOTE — Progress Notes (Signed)
STROKE TEAM PROGRESS NOTE   INTERVAL HISTORY No acute events.  Patient just returned back from dialysis.  He has no complaints.  Neurological exam is unchanged No family at bedside. Patient is up in chair. Denies new concerns or symptoms. He states understanding of plan for TEE which is pending.  Vitals:   10/04/20 1000 10/04/20 1030 10/04/20 1055 10/04/20 1100  BP: (!) 124/96 (!) 140/93 (!) 139/99 130/82  Pulse: 77 83 86 88  Resp:   17 18  Temp:   (!) 97.5 F (36.4 C) 97.8 F (36.6 C)  TempSrc:   Oral Oral  SpO2:   98% 99%  Weight:   79 kg   Height:       CBC:  Recent Labs  Lab 10/03/20 0242 10/04/20 0211  WBC 31.1* 26.0*  NEUTROABS 28.0* 23.9*  HGB 8.6* 8.7*  HCT 26.5* 25.3*  MCV 81.8 80.3  PLT 294 [id number]   Basic Metabolic Panel:  Recent Labs  Lab 10/03/20 0242 10/04/20 0211  NA 133* 132*  K 3.9 4.2  CL 91* 91*  CO2 23 23  GLUCOSE 123* 119*  BUN 71* 93*  CREATININE 8.93* 10.74*  CALCIUM 8.2* 8.0*  MG 2.6* 2.8*  PHOS 6.0* 7.0*   Lipid Panel:  Recent Labs  Lab 09/29/20 0435  CHOL 126  TRIG 342*  HDL <10*  CHOLHDL NOT CALCULATED  VLDL 68*  LDLCALC NOT CALCULATED   HgbA1c:  Recent Labs  Lab 09/29/20 0435  HGBA1C 5.5   Urine Drug Screen: No results for input(s): LABOPIA, COCAINSCRNUR, LABBENZ, AMPHETMU, THCU, LABBARB in the last 168 hours.  Alcohol Level No results for input(s): ETH in the last 168 hours.  MRA Neck Result date 09/28/2020 The visualized common carotid, internal carotid and vertebral arteries patent within the neck without appreciable stenosis.  MRA head WO contrast: Result date 09/28/2020 No intracranial large vessel occlusion or proximal high-grade arterial stenosis.  MRI Brain WO contrast Result date 09/28/2020 1. 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 Multiple vascular territories are involved and findings are  highly suspicious for an embolic process. 2. 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 A few scattered supratentorial chronic microhemorrhages are also present. 3. Mild generalized atrophy of the brain.  CT Head WO contrast Result date 09/28/2020 IMPRESSION: 1. 10 mm infarct within the medial left cerebellar hemisphere, which may be acute. Consider brain MRI for further evaluation. 2. No acute intracranial hemorrhage or acute demarcated cortical infarct.  EEG adult Result Date: 09/28/2020 IMPRESSION: This study initially showed generalized periodic discharges with triphasic morphology at 2-2.'5hz'$ . This eeg pattern can be on the ictal-interictal continuum and also seen with toxic-metabolic etiology. EEG improved after IV ativan which although not confirmatory is concerning for ictal nature of this pattern. There is also moderate diffuse encephalopathy, non specific etiology. One episode of right arm tremor was noted at 1528 during which eeg showed generalized periodic discharges with triphasic morphology at 2-2.5 without definite evolution. However, focal motor seizures may not be seen on scalp eeg. Therefore, clinical correlation is recommended. Patrick Anthony   ECHOCARDIOGRAM COMPLETE BUBBLE STUDY Result Date: 09/28/2020    1. Left ventricular ejection fraction, by estimation, is 55 to 60%. The left ventricle has normal function. The left ventricle has no regional wall motion abnormalities. There is moderate concentric left ventricular hypertrophy. Left ventricular diastolic parameters are consistent with Grade II diastolic dysfunction (  pseudonormalization).  2. Right ventricular systolic function is normal. The right ventricular size is normal. There is normal pulmonary artery systolic pressure.  3. Left atrial size was moderately dilated.   4. Right atrial size was mild to moderately dilated.   5. The mitral valve is normal in structure.  Mild to moderate mitral valve regurgitation. No evidence of mitral stenosis. Moderate to severe mitral annular calcification.   6. The aortic valve is tricuspid. Aortic valve regurgitation is trivial. No aortic stenosis is present.   7. The inferior vena cava is normal in size with greater than 50% respiratory variability, suggesting right atrial pressure of 3 mmHg.   8. Agitated saline contrast bubble study was negative, with no evidence of any interatrial shunt.   PHYSICAL EXAM  Temp:  [97.5 F (36.4 C)-98.6 F (37 C)] 97.8 F (36.6 C) (02/28 1100) Pulse Rate:  [71-88] 88 (02/28 1100) Resp:  [17-21] 18 (02/28 1100) BP: (115-144)/(70-103) 130/82 (02/28 1100) SpO2:  [97 %-100 %] 99 % (02/28 1100) Weight:  [79 kg-81 kg] 79 kg (02/28 1055)  General -Pleasant middle-age African-American male.  Not in distress.  Ophthalmologic - fundi not visualized due to noncooperation.  Cardiovascular - Regular rhythm and no murmur Neuro -awake alert and interactive.  Orientted to self, place, hospital name, year and month. When I ask about his situation he replies he is here for "heart stuff". Paucity of speech, but able to speak short sentences, able to name 2/4, following simple commands. No gaze palsy, track bilaterally, blinking to visual threat bilaterally, PERRL. No facial droop. Tongue midline. Bilateral UEs proximal 3/5 and distal 3+/5. Bilaterally LEs 3/5 at least. Sensation subjectively symmetrical and coordination intact but slow.  gait not tested.   ASSESSMENT/PLAN [name] is a 49 y.o. male with history of end-stage renal disease due to adult polycystic kidney disease on hemodialysis Monday, Wednesday, Friday with left upper extremity AV graft, hypertension, obstructive sleep apnea, chronic heart failure with preserved ejection fraction, hepatitis C with liver cirrhosis, and history of alcohol abuse who presented to the hospital 09/27/20 for evaluation of generalized weakness,  lethargy, tachycardia, fever, and chills. He was found to be septic with a temperature of 103.2, a heart rate in the 140's, leukocytosis, and an elevated lactic acidosis with lactic acid of 3.5; presumably secondary to a left lower lobe pneumonia identified on x-ray.  On 09/28/2020 at [place] he developed word finding difficulty and aphasia.  He was also noted to have tremor of the upper extremities, subsequently a code stroke was activated and he was taken to CT for imaging.  No TPA was given as he was outside of time window (LKN 0100).  He was not a thrombectomy candidate as no LVO seen on imaging.      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    MRI head: Scattered small acute infarcts within the bilateral cerebral hemispheres, right basal ganglia, callosal splenium and bilateral cerebellar hemispheres  MRA head and neck - negative  2D Echo EF 55-60%, Grade II diastolic dysfunction, moderate LVH  Recommend TEE to rule out infective endocardiditis  Direct LDL - 25.4  Triglycerides 342  HgbA1c 5.5  VTE prophylaxis - Heparin with dialysis  NPO: speech therapy is following  No antithrombotic prior to admission, now on No antithrombotic due to the possibility of endocarditis  Therapy recommendations:  SNF  Disposition: pending  Tremor vs. Myoclonus  Asterixis   EEG: moderate diffuse encephalopathy, non specific etiology.  Multiple episodes of right arm tremor were noted without concomitant EEG change and are most likely not epileptic.   Likely due to encephalopathy    Ammonia 27  Significant asterixis, consistent with encephalopathy  Continue to treat underlying disease  ? MRSA bacteremia - ID following  ? 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 BP goal normotensive  Hyperlipidemia  Home meds:  none  Direct LDL - 25.4, goal < 70  TG 342  No statin needed at this time given low LDL  Tobacco Abuse  Current smoker  Will need smoking cessation counseling once appropriate  Nicotine patch  Other Stroke Risk Factors  ETOH use  Coronary artery disease  Obstructive sleep apnea, unclear if he is on CPAP at home  Congestive heart failure diastolic CHF (0000000)  Other Active problems  Hepatitis C/ Cirrhosis of liver   Anemia of chronic disease - Hgb - 9.3->9.9->10.4->9.1->8.6  INR 1.4->1.3  Hospital day # 7 Plan await TEE to look for endocarditis or cardiac source of embolism.  Continue ongoing stroke work-up.  Discussed with Dr. Florene Glen.  Greater than 50% time during this 25-minute visit was spent on counseling and coordination of care and patient care team and answering questions.  Antony Contras, MD  To contact Stroke Continuity provider, please refer to [email or web address] After hours, contact General Neurology here tenaciously also

## 2020-10-04 NOTE — Plan of Care (Signed)
  Problem: Education: Goal: Knowledge of disease or condition will improve Outcome: Progressing Goal: Knowledge of secondary prevention will improve Outcome: Progressing Goal: Knowledge of patient specific risk factors addressed and post discharge goals established will improve Outcome: Progressing Goal: Individualized Educational Video(s) Outcome: Progressing   Problem: Health Behavior/Discharge Planning: Goal: Ability to manage health-related needs will improve Outcome: Progressing   Problem: Self-Care: Goal: Ability to participate in self-care as condition permits will improve Outcome: Progressing Goal: Ability to communicate needs accurately will improve Outcome: Progressing   Problem: Nutrition: Goal: Risk of aspiration will decrease Outcome: Progressing Goal: Dietary intake will improve Outcome: Progressing   Problem: Ischemic Stroke/TIA Tissue Perfusion: Goal: Complications of ischemic stroke/TIA will be minimized Outcome: Progressing

## 2020-10-04 NOTE — Progress Notes (Signed)
  Speech Language Pathology Treatment: Dysphagia  Patient Details Name: Patrick Anthony MRN: [id number] DOB: [birth year] Today's Date: 10/04/2020 Time: [id number] SLP Time Calculation (min) (ACUTE ONLY): 8 min  Assessment / Plan / Recommendation Clinical Impression  Pt seen with upgraded PO trials with no overt s/s of aspiration noted; however, PO trials were limited due to pt's lethargic state from dialysis despite encouragement from SLP. Pt benefited from [place] verbal cues for appropriate positioning while eating/drinking but did not remain in an upright position during all trials. Pt was also noted to be less impulsive today. Given pt's improvement in mentation and impulsivity, recommend that he remain on a regular diet with thin liquids and would benefit from continued full supervision while eating/drinking. SLP will continue to follow up to assess pt's oropharyngeal swallow on this diet.    HPI HPI: Pt is a 50 y.o male with sepsis likely due to pneumonia. MRI 2/22 showed scattered small acute infarcts within the bilateral cerebral  hemispheres, right basal ganglia, callosal splenium and bilateral cerebellar hemispheres. CXR from 2/21 revealed low lung volumes with streaky left basilar opacities which could  represent atelectasis or infection. PMH with significant history for ESRD on dialysis, hypertension, and CHF.      SLP Plan  Continue with current plan of care       Recommendations  Diet recommendations: Regular;Thin liquid Liquids provided via: Cup;Straw Medication Administration: Whole meds with liquid Supervision: Patient able to self feed;Full supervision/cueing for compensatory strategies Compensations: Slow rate;Small sips/bites Postural Changes and/or Swallow Maneuvers: Seated upright 90 degrees                Oral Care Recommendations: Oral care BID Follow up Recommendations: Inpatient Rehab;24 hour supervision/assistance SLP Visit Diagnosis: Dysphagia, unspecified  (R13.10) Plan: Continue with current plan of care       GO                Patrick Anthony., SLP Student 10/04/2020, 1:48 PM

## 2020-10-04 NOTE — Progress Notes (Signed)
[facility]    Date of Admission:  09/27/2020   Total days of antibiotics 5           ID: Patrick Anthony is a 50 y.o. 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 amLODipine  10 mg Oral Daily  . calcium acetate  667 mg Oral TID WC  . calcium acetate  667 mg Oral With snacks  . Chlorhexidine Gluconate Cloth  6 each Topical Q0600  . cinacalcet  30 mg Oral Q breakfast  . cloNIDine  0.1 mg Transdermal Weekly  . [START ON 10/11/2020] darbepoetin (ARANESP) injection - DIALYSIS  60 mcg Intravenous Q14 Days  . doxercalciferol  3 mcg Intravenous Q M,W,F-HD  . hydrALAZINE  50 mg Oral BID  . metoprolol tartrate  75 mg Oral BID    Objective: Vital signs in last 24 hours: Temp:  [97.5 F (36.4 C)-98.6 F (37 C)] 97.5 F (36.4 C) (02/28 1055) Pulse Rate:  [71-86] 86 (02/28 1055) Resp:  [17-21] 17 (02/28 1055) BP: (115-144)/(70-103) 139/99 (02/28 1055) SpO2:  [97 %-100 %] 98 % (02/28 1055) Weight:  [79 kg-81 kg] 79 kg (02/28 1055)  Physical Exam  Constitutional: no distress, conversant HEENT: conj clear; normocephalic; oropharynx clear Cardiovascular: rrr 2/6 systolic murmur Pulmonary/Chest: Effort normal; cta Abdominal: Soft/nt Neurological: nonfocal Psych alert/oriented, mood apprpriate Skin no rash    Lab Results Recent Labs    10/03/20 0242 10/04/20 0211  WBC 31.1* 26.0*  HGB 8.6* 8.7*  HCT 26.5*  25.3*  NA 133* 132*  K 3.9 4.2  CL 91* 91*  CO2 23 23  BUN 71* 93*  CREATININE 8.93* 10.74*   Liver Panel Recent Labs    10/03/20 0242 10/04/20 0211  PROT 7.3 7.4  ALBUMIN 2.2* 2.2*  AST 19 22  ALT 11 12  ALKPHOS 75 75  BILITOT 1.1 1.2    Microbiology: 2/24 blood cx NGTD 2/21 blood cx MRSA   Studies/Results: 2/22 brain mri 1. 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 Multiple vascular territories are involved and findings are highly suspicious for an embolic process. 2. 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 A few scattered supratentorial chronic microhemorrhages are also present. 3. Mild generalized atrophy of the brain  2/22 tte 1. Left ventricular ejection fraction, by estimation, is 55 to 60%. The  left ventricle has normal function. The left ventricle has no regional  wall motion abnormalities. There is moderate concentric left ventricular  hypertrophy. Left ventricular  diastolic parameters are consistent with Grade II diastolic dysfunction  (pseudonormalization).  2. Right ventricular systolic function is normal. The right ventricular  size is normal. There is normal pulmonary artery systolic pressure.  3. Left atrial size was moderately dilated.  4. Right atrial size was mild to moderately dilated.  5. The  mitral valve is normal in structure. Mild to moderate mitral valve  regurgitation. No evidence of mitral stenosis. Moderate to severe mitral  annular calcification.  6. The aortic valve is tricuspid. Aortic valve regurgitation is trivial.  No aortic stenosis is present.  7. The inferior vena cava is normal in size with greater than 50%  respiratory variability, suggesting right atrial pressure of 3 mmHg.  8. Agitated saline contrast bubble study was negative, with no evidence  of  any interatrial shunt.  2/23 chest abd pelv ct 1. Cavitary mass in the left upper lobe apex measuring up to 2.2 cm with surrounding ground-glass density. Findings could be secondary to cavitary infection or potential septic embolus. A cavitary neoplasm is also in the differential. There are additional small foci of consolidation and ground-glass density in the right upper lobe and small ground-glass nodule in left upper lobe, potentially infectious or inflammatory. Short interval chest CT follow-up is recommended. 2. Polycystic kidney disease. 3. No CT evidence for acute intra-abdominal or pelvic abnormality. 4. Bulky calcified mass anterior to right shoulder with smaller left paraspinal soft tissue calcification probably due to calcinosis related to chronic kidney disease  2/24 mr lumbar thoracic 1. No thoracolumbar discitis-osteomyelitis or epidural collection. 2. Mild bone marrow edema and disc space edema at C6-C7, likely degenerative. 3. Mild L5-S1 degenerative disc disease without spinal canal or neural foraminal stenosis  Assessment/Plan: Abx: 2/21-c vanc  50yo M with ESRD, hep c (no prior tx) admitted 2/21 with 10 day malaise, fever, found to have sepsis due to MRSA bacteremia c/b embolic stroke presumably from endocarditis  Complications: CNS septic emboli, pulmonary cavitary lesions/septic emboli. Has lower back pain but negative mri lumbar thoracic  2/21 & 2/24 bcx mrsa 2/27 bcx negative  Overall improving mentation/clinical status  -continue vancomycin -await tee -pending hep c genotype; can f/u ID clinic for tx        [facility]  I spent more than 30 minute reviewing data/chart, and coordinating care and >50% direct face to face time providing counseling/discussing diagnostics/treatment plan with patient   10/04/2020, 12:06 PM

## 2020-10-05 ENCOUNTER — Other Ambulatory Visit: Payer: Self-pay

## 2020-10-05 ENCOUNTER — Inpatient Hospital Stay (HOSPITAL_COMMUNITY): Payer: Medicaid Other | Admitting: Certified Registered Nurse Anesthetist

## 2020-10-05 ENCOUNTER — Encounter (HOSPITAL_COMMUNITY): Payer: Self-pay | Admitting: Internal Medicine

## 2020-10-05 ENCOUNTER — Encounter (HOSPITAL_COMMUNITY)
Admission: EM | Disposition: A | Discharge status: Skilled Nursing Facility | Payer: Self-pay | Source: Home / Self Care | Attending: Internal Medicine

## 2020-10-05 ENCOUNTER — Inpatient Hospital Stay (HOSPITAL_COMMUNITY): Payer: Medicaid Other

## 2020-10-05 DIAGNOSIS — I5042 Chronic combined systolic (congestive) and diastolic (congestive) heart failure: Secondary | ICD-10-CM | POA: Diagnosis not present

## 2020-10-05 DIAGNOSIS — R29712 NIHSS score 12: Secondary | ICD-10-CM | POA: Diagnosis not present

## 2020-10-05 DIAGNOSIS — I340 Nonrheumatic mitral (valve) insufficiency: Secondary | ICD-10-CM | POA: Diagnosis not present

## 2020-10-05 DIAGNOSIS — B9561 Methicillin susceptible Staphylococcus aureus infection as the cause of diseases classified elsewhere: Secondary | ICD-10-CM | POA: Diagnosis not present

## 2020-10-05 DIAGNOSIS — Z992 Dependence on renal dialysis: Secondary | ICD-10-CM | POA: Diagnosis not present

## 2020-10-05 DIAGNOSIS — I339 Acute and subacute endocarditis, unspecified: Secondary | ICD-10-CM | POA: Diagnosis not present

## 2020-10-05 DIAGNOSIS — G928 Other toxic encephalopathy: Secondary | ICD-10-CM | POA: Diagnosis not present

## 2020-10-05 DIAGNOSIS — R29718 NIHSS score 18: Secondary | ICD-10-CM | POA: Diagnosis not present

## 2020-10-05 DIAGNOSIS — D631 Anemia in chronic kidney disease: Secondary | ICD-10-CM | POA: Diagnosis not present

## 2020-10-05 DIAGNOSIS — R7881 Bacteremia: Secondary | ICD-10-CM | POA: Diagnosis not present

## 2020-10-05 DIAGNOSIS — I059 Rheumatic mitral valve disease, unspecified: Secondary | ICD-10-CM | POA: Diagnosis not present

## 2020-10-05 DIAGNOSIS — N186 End stage renal disease: Secondary | ICD-10-CM | POA: Diagnosis not present

## 2020-10-05 DIAGNOSIS — R0602 Shortness of breath: Secondary | ICD-10-CM | POA: Diagnosis not present

## 2020-10-05 DIAGNOSIS — R531 Weakness: Secondary | ICD-10-CM | POA: Diagnosis not present

## 2020-10-05 DIAGNOSIS — R29716 NIHSS score 16: Secondary | ICD-10-CM | POA: Diagnosis not present

## 2020-10-05 DIAGNOSIS — A4102 Sepsis due to Methicillin resistant Staphylococcus aureus: Secondary | ICD-10-CM | POA: Diagnosis not present

## 2020-10-05 DIAGNOSIS — I2690 Septic pulmonary embolism without acute cor pulmonale: Secondary | ICD-10-CM | POA: Diagnosis not present

## 2020-10-05 DIAGNOSIS — I6340 Cerebral infarction due to embolism of unspecified cerebral artery: Secondary | ICD-10-CM | POA: Diagnosis not present

## 2020-10-05 DIAGNOSIS — I350 Nonrheumatic aortic (valve) stenosis: Secondary | ICD-10-CM | POA: Diagnosis not present

## 2020-10-05 DIAGNOSIS — I132 Hypertensive heart and chronic kidney disease with heart failure and with stage 5 chronic kidney disease, or end stage renal disease: Secondary | ICD-10-CM | POA: Diagnosis not present

## 2020-10-05 DIAGNOSIS — I5032 Chronic diastolic (congestive) heart failure: Secondary | ICD-10-CM | POA: Diagnosis not present

## 2020-10-05 DIAGNOSIS — M545 Low back pain, unspecified: Secondary | ICD-10-CM

## 2020-10-05 DIAGNOSIS — Z20822 Contact with and (suspected) exposure to covid-19: Secondary | ICD-10-CM | POA: Diagnosis not present

## 2020-10-05 DIAGNOSIS — A419 Sepsis, unspecified organism: Secondary | ICD-10-CM | POA: Diagnosis not present

## 2020-10-05 DIAGNOSIS — R29711 NIHSS score 11: Secondary | ICD-10-CM | POA: Diagnosis not present

## 2020-10-05 DIAGNOSIS — I330 Acute and subacute infective endocarditis: Secondary | ICD-10-CM | POA: Diagnosis not present

## 2020-10-05 DIAGNOSIS — I6310 Cerebral infarction due to embolism of unspecified precerebral artery: Secondary | ICD-10-CM | POA: Diagnosis not present

## 2020-10-05 DIAGNOSIS — I639 Cerebral infarction, unspecified: Secondary | ICD-10-CM | POA: Diagnosis not present

## 2020-10-05 DIAGNOSIS — J189 Pneumonia, unspecified organism: Secondary | ICD-10-CM | POA: Diagnosis not present

## 2020-10-05 HISTORY — PX: BUBBLE STUDY: SHX6837

## 2020-10-05 HISTORY — PX: TEE WITHOUT CARDIOVERSION: SHX5443

## 2020-10-05 LAB — PHOSPHORUS: Phosphorus: 6 mg/dL — ABNORMAL HIGH (ref 2.5–4.6)

## 2020-10-05 LAB — CBC WITH DIFFERENTIAL/PLATELET
Abs Immature Granulocytes: 1.24 10*3/uL — ABNORMAL HIGH (ref 0.00–0.07)
Basophils Absolute: 0.1 10*3/uL (ref 0.0–0.1)
Basophils Relative: 0 %
Eosinophils Absolute: 0.1 10*3/uL (ref 0.0–0.5)
Eosinophils Relative: 1 %
HCT: 26.1 % — ABNORMAL LOW (ref 39.0–52.0)
Hemoglobin: 8.7 g/dL — ABNORMAL LOW (ref 13.0–17.0)
Immature Granulocytes: 6 %
Lymphocytes Relative: 7 %
Lymphs Abs: 1.4 10*3/uL (ref 0.7–4.0)
MCH: 27.5 pg (ref 26.0–34.0)
MCHC: 33.3 g/dL (ref 30.0–36.0)
MCV: 82.6 fL (ref 80.0–100.0)
Monocytes Absolute: 2.1 10*3/uL — ABNORMAL HIGH (ref 0.1–1.0)
Monocytes Relative: 10 %
Neutro Abs: 15.7 10*3/uL — ABNORMAL HIGH (ref 1.7–7.7)
Neutrophils Relative %: 76 %
Platelets: 230 10*3/uL (ref 150–400)
RBC: 3.16 MIL/uL — ABNORMAL LOW (ref 4.22–5.81)
RDW: 17.2 % — ABNORMAL HIGH (ref 11.5–15.5)
WBC: 20.6 10*3/uL — ABNORMAL HIGH (ref 4.0–10.5)
nRBC: 0 % (ref 0.0–0.2)

## 2020-10-05 LAB — CULTURE, BLOOD (ROUTINE X 2)
Culture: NO GROWTH
Special Requests: ADEQUATE

## 2020-10-05 LAB — COMPREHENSIVE METABOLIC PANEL
ALT: 13 U/L (ref 0–44)
AST: 25 U/L (ref 15–41)
Albumin: 2.2 g/dL — ABNORMAL LOW (ref 3.5–5.0)
Alkaline Phosphatase: 70 U/L (ref 38–126)
Anion gap: 15 (ref 5–15)
BUN: 53 mg/dL — ABNORMAL HIGH (ref 6–20)
CO2: 25 mmol/L (ref 22–32)
Calcium: 7.6 mg/dL — ABNORMAL LOW (ref 8.9–10.3)
Chloride: 94 mmol/L — ABNORMAL LOW (ref 98–111)
Creatinine, Ser: 7.85 mg/dL — ABNORMAL HIGH (ref 0.61–1.24)
GFR, Estimated: 8 mL/min — ABNORMAL LOW (ref >60)
Glucose, Bld: 115 mg/dL — ABNORMAL HIGH (ref 70–99)
Potassium: 4 mmol/L (ref 3.5–5.1)
Sodium: 134 mmol/L — ABNORMAL LOW (ref 135–145)
Total Bilirubin: 1.1 mg/dL (ref 0.3–1.2)
Total Protein: 7.6 g/dL (ref 6.5–8.1)

## 2020-10-05 LAB — MAGNESIUM: Magnesium: 2.5 mg/dL — ABNORMAL HIGH (ref 1.7–2.4)

## 2020-10-05 SURGERY — ECHOCARDIOGRAM, TRANSESOPHAGEAL
Anesthesia: Monitor Anesthesia Care

## 2020-10-05 MED ORDER — CHLORHEXIDINE GLUCONATE CLOTH 2 % EX PADS
6.0000 | MEDICATED_PAD | Freq: Every day | CUTANEOUS | Status: DC
  Administered 2020-10-06 – 2020-10-12 (×4): 6 via TOPICAL

## 2020-10-05 MED ORDER — RENA-VITE PO TABS
1.0000 | ORAL_TABLET | Freq: Every day | ORAL | Status: DC
  Administered 2020-10-05 – 2020-10-13 (×9): 1 via ORAL
  Filled 2020-10-05 (×9): qty 1

## 2020-10-05 MED ORDER — VANCOMYCIN HCL IN DEXTROSE 750-5 MG/150ML-% IV SOLN
750.0000 mg | INTRAVENOUS | Status: DC
  Administered 2020-10-08: 750 mg via INTRAVENOUS
  Filled 2020-10-05 (×2): qty 150

## 2020-10-05 MED ORDER — SODIUM CHLORIDE 0.9 % IV SOLN: INTRAVENOUS | Status: DC

## 2020-10-05 MED ORDER — DARBEPOETIN ALFA 100 MCG/0.5ML IJ SOSY
100.0000 ug | PREFILLED_SYRINGE | INTRAMUSCULAR | Status: DC
  Administered 2020-10-06 – 2020-10-13 (×2): 100 ug via INTRAVENOUS
  Filled 2020-10-05 (×2): qty 0.5

## 2020-10-05 MED ORDER — PROPOFOL 10 MG/ML IV BOLUS
INTRAVENOUS | Status: DC | PRN
  Administered 2020-10-05: 15 mg via INTRAVENOUS
  Administered 2020-10-05: 25 mg via INTRAVENOUS
  Administered 2020-10-05: 20 mg via INTRAVENOUS

## 2020-10-05 MED ORDER — VANCOMYCIN HCL IN DEXTROSE 500-5 MG/100ML-% IV SOLN
500.0000 mg | INTRAVENOUS | Status: AC
  Administered 2020-10-06: 500 mg via INTRAVENOUS
  Filled 2020-10-05: qty 100

## 2020-10-05 MED ORDER — LIDOCAINE 2% (20 MG/ML) 5 ML SYRINGE
INTRAMUSCULAR | Status: DC | PRN
  Administered 2020-10-05: 40 mg via INTRAVENOUS

## 2020-10-05 MED ORDER — PROPOFOL 500 MG/50ML IV EMUL
INTRAVENOUS | Status: DC | PRN
  Administered 2020-10-05: 85 ug/kg/min via INTRAVENOUS

## 2020-10-05 MED ORDER — BUTAMBEN-TETRACAINE-BENZOCAINE 2-2-14 % EX AERO
INHALATION_SPRAY | CUTANEOUS | Status: DC | PRN
  Administered 2020-10-05: 2 via TOPICAL

## 2020-10-05 MED ORDER — NEPRO/CARBSTEADY PO LIQD
237.0000 mL | Freq: Two times a day (BID) | ORAL | Status: DC
  Administered 2020-10-05 – 2020-10-13 (×8): 237 mL via ORAL

## 2020-10-05 MED ORDER — LIDOCAINE VISCOUS HCL 2 % MT SOLN
OROMUCOSAL | Status: DC | PRN
  Administered 2020-10-05: 20 mL via OROMUCOSAL

## 2020-10-05 NOTE — Progress Notes (Signed)
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-  Needs surgical evaluation - is in good spirits about it however   Vitals:   10/05/20 0911 10/05/20 1018 10/05/20 1040 10/05/20 1042  BP: 127/79 (!) 170/67 (!) 180/88 (!) 180/88  Pulse: 77 84 78 81  Resp: 15 (!) 35 (!) 22 20  Temp: 98.1 F (36.7 C) (!) 97.3 F (36.3 C)    TempSrc: Temporal Temporal    SpO2: 100% 100% 100% 100%  Weight: 79 kg     Height: 6' (1.829 m)       Exam:   Gen - alert, conversant  no jvd  Chest cta bilat  Cor reg no RG  Abd soft ntnd no ascites   Ext no LE edema   Alert, NF, ox3   LUA AVG +bruit     OP HD: [facility]   4h  350/500  82.5kg  2/2.5 bath  P1  Hep 5500  LUA AVG   Aranesp 23mg q2wks - last 09/27/20   Hectorol 369m IV qHD    Assessment/ Plan: 1. MRSA bacteremia: w/ fevers and AMS.  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 Has AVG LUA w/o gross signs of infection. Getting IV vanc. Now needing a surgical evaluation for this endocarditis 2. Acute CVA - multiple by MRI, embolic 3. AMS - due to infection +/- aphasia from CVA's. Resolved, Ox 3.  4. ESRD - usual HD MWF.  Next HD tomorrow via AVG.  5. Hypertension/volume  - Blood pressure chronically hard to control, on 4 bp lowering meds here. BP's wnl. Wt is under 1-2kg. 1-2 L goal w/ next HD- will need EDW adjust at discharge.  6. Anemia of CKD - Hgb 9.3- now in the 8's.  Last esa at OP HD was 2/21, will inc and order here for tomorrow 7. Secondary Hyperparathyroidism -  Ca in goal, phos 5- 7.  Continue hectorol, sensipar and binders- phoslo 8. Nutrition - Renal diet w/fluid restriction  9. Hx Hep C /liver cirrhosis  KeLouis Meckel3/08/2020, 12:38 PM   Recent Labs  Lab 10/04/20 0211 10/05/20 0431  K 4.2 4.0  BUN 93* 53*  CREATININE 10.74* 7.85*  CALCIUM 8.0* 7.6*  PHOS 7.0* 6.0*  HGB 8.7* 8.7*    Inpatient medications: . amLODipine  10 mg Oral Daily  . calcium acetate  667 mg Oral TID WC  . calcium acetate  667 mg Oral With snacks  . Chlorhexidine Gluconate Cloth  6 each Topical Q0600  . cinacalcet  30 mg Oral Q breakfast  . cloNIDine  0.1 mg Transdermal Weekly  . [START ON 10/11/2020] darbepoetin (ARANESP) injection - DIALYSIS  60 mcg Intravenous Q14 Days  . doxercalciferol  3 mcg Intravenous Q M,W,F-HD  . feeding supplement (NEPRO CARB STEADY)  237 mL Oral BID BM  . hydrALAZINE  50 mg Oral BID  . metoprolol tartrate  75 mg Oral BID  . multivitamin  1 tablet Oral QHS   . [START ON 10/06/2020] vancomycin     acetaminophen **OR** acetaminophen (TYLENOL) oral liquid 160 mg/5 mL **OR** acetaminophen, albuterol, HYDROmorphone (DILAUDID) injection, ondansetron **OR** ondansetron (ZOFRAN) IV, oxyCODONE, polyethylene glycol

## 2020-10-05 NOTE — Progress Notes (Signed)
PT Cancellation Note  Patient Details Name: Donaldo Stegman MRN: [id number] DOB: 1971-05-11   Cancelled Treatment:    Reason Eval/Treat Not Completed: Patient declined, no reason specified. Despite encouragement pt did not want to mobilize.   Leighton Roach, PT  Acute Rehab Services  Pager [phone] Office [place] 10/05/2020, 3:44 PM

## 2020-10-05 NOTE — Anesthesia Procedure Notes (Signed)
Procedure Name: MAC Date/Time: 10/05/2020 9:52 AM Performed by: Colin Benton, CRNA Pre-anesthesia Checklist: Patient identified, Suction available, Emergency Drugs available and Patient being monitored Patient Re-evaluated:Patient Re-evaluated prior to induction Oxygen Delivery Method: Nasal cannula Induction Type: IV induction Placement Confirmation: positive ETCO2 Dental Injury: Teeth and Oropharynx as per pre-operative assessment

## 2020-10-05 NOTE — Consult Note (Addendum)
[address]       ,[place] [address]             [id number]        Caillou Fournier [facility] Medical Record [id number] Date of Birth: Mar 20, 1971  Referring: Dr. Fayrene Helper  Primary Care: [name], MD Primary Cardiologist:Traci Radford Pax, MD  Chief Complaint:    Chief Complaint  Patient presents with  . Generalized Body Aches    History of Present Illness:      Patrick Anthony is a 50 year old male patient with past medical history significant for end-stage renal disease(dialysis dependent on Monday, Wednesday, Friday through a left upper extremity AV graft), obstructive sleep apnea, chronic diastolic congestive heart failure with an EF of 55 to 65%, hypertension which is uncontrolled due to medication noncompliance, liver cirrhosis due to hepatitis C, and history of alcohol abuse who presented to [facility] with generalied weakness.  Patient symptoms started about 10 days prior to admission and was associated with decreased energy, tachycardia, fever/chills,chest pain with shortness of breath and mild nausea with abdominal pain.   In the emergency department, he was febrile to 103.2 F, his blood pressure was stable, he did have some tachycardia up to 143 bpm.  Patient was tested for COVID-19 and was negative.  He was treated with IV antibiotics and was admitted to the hospital for further evaluation and treatment.  Neurology was initially consulted for code stroke and encephalopathy.  It was found that he had bilateral anterior and posterior small infarcts likely secondary to a cardioembolic source, concerning for endocarditis.  Nephrology was consulted for management of end-stage renal disease and hemodialysis.  He underwent a transthoracic echocardiogram on 09/28/2020, which showed suspected bacterial endocarditis of the mitral valve.  There was a very eccentric jet of mitral regurgitation.  It was thought that he could have a partially flail leaflet as  well.  He had a follow-up TEE on 10/05/2020 which showed a large vegetation on the mitral valve, the posterior mitral leaflet appears perforated and there is extensive vegetation on both mitral leaflets.  There is mild to moderate mitral valve regurgitation.  There was no evidence of any interatrial shunt.  Infectious diseases will to follow-up on blood cultures, which were initially MRSA.  Recent blood cultures that were sent on 10/03/2020 were negative and the patient has been treated with IV vancomycin.  We are consulted for possible surgical intervention on the mitral valve for endocarditis.  We would also like to suggest a formal cardiology consult for this patient.   Current Activity/ Functional Status: Patient was independent with mobility/ambulation, transfers, ADL's, IADL's.   Zubrod Score: At the time of surgery this patient's most appropriate activity status/level should be described as: '[]'$     0    Normal activity, no symptoms '[x]'$     1    Restricted in physical strenuous activity but ambulatory, able to do out light work '[]'$     2    Ambulatory and capable of self care, unable to do work activities, up and about                 more than 50%  Of the time                            '[]'$     3    Only limited self care, in bed greater than 50% of waking hours '[]'$   4    Completely disabled, no self care, confined to bed or chair '[]'$     5    Moribund  Past Medical History:  Diagnosis Date  . 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 Chronic kidney disease    patient on dialysis tues-thurs-sat  . Cirrhosis of liver ([place])   . Drug addiction ([place])   . ETOH abuse   . Hepatitis   . Hypertension   . Mitral regurgitation   . Patent foramen ovale 10/23/2019  . Renal disorder renal failure  . Sleep apnea   . Systolic and diastolic CHF, 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[place]) 04/19/2013    Past Surgical History:  Procedure Laterality Date  . AV FISTULA  PLACEMENT Right   . AV FISTULA PLACEMENT Left 09/03/2013   Procedure: ARTERIOVENOUS (AV) FISTULA CREATION - LEFT BRACHIOCEPHALIC;  Surgeon: Conrad Richville, MD;  Location: [facility] OR;  Service: Vascular;  Laterality: Left;  . AV FISTULA PLACEMENT Left 10/30/2019   Procedure: INSERTION OF ARTERIOVENOUS (AV) GORE-TEX GRAFT ARM;  Surgeon: Rosetta Posner, MD;  Location: [place];  Service: Vascular;  Laterality: Left;  . BUBBLE STUDY  10/05/2020   Procedure: BUBBLE STUDY;  Surgeon: Pixie Casino, MD;  Location: [place];  Service: Cardiovascular;;  . INSERTION OF DIALYSIS CATHETER N/A 08/29/2013   Procedure: INSERTION OF DIALYSIS CATHETER;  Surgeon: Mal Misty, MD;  Location: [place];  Service: Vascular;  Laterality: N/A;  . INSERTION OF DIALYSIS CATHETER Left 10/30/2019   Procedure: INSERTION OF DIALYSIS CATHETER LEFT INTERNAL JUGULAR;  Surgeon: Rosetta Posner, MD;  Location: [place];  Service: Vascular;  Laterality: Left;  . PERIPHERAL VASCULAR CATHETERIZATION N/A 01/06/2015   Procedure: A/V Shuntogram/Fistulagram;  Surgeon: Katha Cabal, MD;  Location: [place] CV LAB;  Service: Cardiovascular;  Laterality: N/A;  . PERIPHERAL VASCULAR CATHETERIZATION Left 01/06/2015   Procedure: A/V Shunt Intervention;  Surgeon: Katha Cabal, MD;  Location: [place] CV LAB;  Service: Cardiovascular;  Laterality: Left;  . TEE WITHOUT CARDIOVERSION N/A 10/05/2020   Procedure: TRANSESOPHAGEAL ECHOCARDIOGRAM (TEE);  Surgeon: Pixie Casino, MD;  Location: [facility] ENDOSCOPY;  Service: Cardiovascular;  Laterality: N/A;    Social History   Tobacco Use  Smoking Status Former Smoker  . Packs/day: 0.20  . Types: Cigarettes  . Quit date: 10/05/2017  . Years since quitting: 3.0  Smokeless Tobacco Never Used  Tobacco Comment   Quit 3 years ago    Social History   Substance and Sexual Activity  Alcohol Use No     No Known Allergies  Current Facility-Administered Medications  Medication Dose Route Frequency  Provider Last Rate Last Admin  . acetaminophen (TYLENOL) tablet 650 mg  650 mg Oral Q4H PRN Oswald Hillock, MD   650 mg at 10/01/20 1643   Or  . acetaminophen (TYLENOL) 160 MG/5ML solution 650 mg  650 mg Per Tube Q4H PRN Oswald Hillock, MD       Or  . acetaminophen (TYLENOL) suppository 650 mg  650 mg Rectal Q4H PRN Oswald Hillock, MD   650 mg at 09/28/20 2327  . albuterol (PROVENTIL) (2.5 MG/3ML) 0.083% nebulizer solution 2.5 mg  2.5 mg Nebulization Q2H PRN Poplawski, Rafal, MD      . amLODipine (NORVASC) tablet 10 mg  10 mg Oral Daily Elodia Florence., MD   10 mg at 10/06/20 0813  . calcium acetate (PHOSLO) capsule 667 mg  667 mg Oral TID WC Poplawski, Rafal, MD   667 mg at 10/06/20 0814  . calcium acetate (PHOSLO) capsule 667 mg  667 mg Oral With snacks Poplawski, Rafal, MD   667 mg at 10/05/20 2058  . Chlorhexidine Gluconate Cloth 2 % PADS 6 each  6 each Topical Q0600 Corliss [place], MD   6 each at 10/06/20 0533  . cinacalcet (SENSIPAR) tablet 30 mg  30 mg Oral Q breakfast Poplawski, Rafal, MD   30 mg at 10/06/20 0814  . cloNIDine (CATAPRES - Dosed in mg/24 hr) patch 0.1 mg  0.1 mg Transdermal Weekly Elodia Florence., MD   0.1 mg at 09/30/20 1507  . Darbepoetin Alfa (ARANESP) injection 100 mcg  100 mcg Intravenous Q Wed-HD Corliss [place], MD      . doxercalciferol (HECTOROL) injection 3 mcg  3 mcg Intravenous Q M,W,F-HD Corliss [place], MD   3 mcg at 10/04/20 1020  . feeding supplement (NEPRO CARB STEADY) liquid 237 mL  237 mL Oral BID BM Elodia Florence., MD   237 mL at 10/05/20 1400  . hydrALAZINE (APRESOLINE) tablet 50 mg  50 mg Oral BID Elodia Florence., MD   50 mg at 10/06/20 0813  . HYDROmorphone (DILAUDID) injection 0.5 mg  0.5 mg Intravenous Q4H PRN Elodia Florence., MD   0.5 mg at 09/30/20 2052  . metoprolol tartrate (LOPRESSOR) tablet 75 mg  75 mg Oral BID Elodia Florence., MD   75 mg at 10/06/20 0813  . multivitamin (RENA-VIT) tablet  1 tablet  1 tablet Oral QHS Elodia Florence., MD   1 tablet at 10/05/20 2057  . ondansetron (ZOFRAN) tablet 4 mg  4 mg Oral Q6H PRN Poplawski, Rafal, MD   4 mg at 09/27/20 2104   Or  . ondansetron (ZOFRAN) injection 4 mg  4 mg Intravenous Q6H PRN Poplawski, Rafal, MD      . oxyCODONE (Oxy IR/ROXICODONE) immediate release tablet 5 mg  5 mg Oral Q4H PRN Poplawski, Rafal, MD   5 mg at 10/01/20 2117  . polyethylene glycol (MIRALAX / GLYCOLAX) packet 17 g  17 g Oral Daily PRN Poplawski, Rafal, MD      . vancomycin (VANCOCIN) IVPB 500 mg/100 ml premix  500 mg Intravenous Q Wed-HD Rolla Flatten, RPH      . [START ON 10/08/2020] vancomycin (VANCOCIN) IVPB 750 mg/150 ml premix  750 mg Intravenous Q M,W,F-HD Rolla Flatten, RPH       Facility-Administered Medications Ordered in Other Encounters  Medication Dose Route Frequency Provider Last Rate Last Admin  . regadenoson (LEXISCAN) injection SOLN 0.4 mg  0.4 mg Intravenous Once Cherlynn Kaiser A, MD      . technetium tetrofosmin (TC-MYOVIEW) injection [id number] millicurie  [id number] millicurie Intravenous Once PRN Cherlynn Kaiser A, MD      . technetium tetrofosmin (TC-MYOVIEW) injection [id number] millicurie  [id number] millicurie Intravenous Once PRN Elouise Munroe, MD        Medications Prior to Admission  Medication Sig Dispense Refill Last Dose  . amLODipine (NORVASC) 10 MG tablet Take 10 mg by mouth every morning.   09/26/2020 at Unknown time  . AURYXIA 1 GM 210 MG(Fe) tablet Take 420 mg by mouth 3 (three) times daily.   09/26/2020 at Unknown time  . calcium acetate (PHOSLO) 667 MG capsule Take 3 capsules (2,001 mg total) by mouth 3 (three) times daily with meals. (Patient taking differently: Take 667-2,001 mg by  mouth See admin instructions. Take 3 capsules (2001 mg) by mouth with each meal & take 1 capsule (667 mg) by mouth with each snack) 270 capsule 5 09/26/2020 at Unknown time  . cloNIDine (CATAPRES) 0.3 MG tablet Take 0.3 mg by mouth in the  morning, at noon, and at bedtime.   09/26/2020 at Unknown time  . Darbepoetin Alfa (ARANESP, ALBUMIN FREE, IJ) Darbepoetin Alfa (Aranesp)     . gabapentin (NEURONTIN) 100 MG capsule Take 200 mg by mouth at bedtime.   09/26/2020 at Unknown time  . heparin 1000 unit/mL SOLN injection Heparin Sodium (Porcine) 1,000 Units/mL Systemic     . hydrALAZINE (APRESOLINE) 100 MG tablet Take 100 mg by mouth 2 (two) times daily.   09/26/2020 at Unknown time  . metoprolol tartrate (LOPRESSOR) 25 MG tablet Take 75 mg by mouth 2 (two) times daily.   3 09/26/2020 at 1600  . nitroGLYCERIN (NITROSTAT) 0.4 MG SL tablet Place 0.4 mg under the tongue every 5 (five) minutes x 3 doses as needed for chest pain.    unknown  . SENSIPAR 30 MG tablet Take 30 mg by mouth daily.   09/26/2020 at Unknown time  . VIAGRA 50 MG tablet Take 50 mg by mouth daily as needed for erectile dysfunction.    unknown  . oxyCODONE-acetaminophen (PERCOCET) 7.5-325 MG tablet Take 1 tablet by mouth every 4 (four) hours as needed for severe pain. (Patient not taking: No sig reported) 20 tablet 0 Not Taking at Unknown time    Family History  Problem Relation Age of Onset  . Hypertension Mother   . Cancer Mother        breast     Review of Systems:   Review of Systems  Constitutional: Positive for chills, fever and malaise/fatigue.  HENT: Negative.   Respiratory: Positive for shortness of breath.   Cardiovascular: Positive for chest pain. Negative for leg swelling.  Gastrointestinal: Positive for abdominal pain and nausea.  Musculoskeletal: Positive for myalgias.  Neurological: Positive for speech change (improving per notes) and weakness.  Psychiatric/Behavioral: Positive for substance abuse.   Pertinent items are noted in HPI.   Physical Exam: BP (!) 137/92 (BP Location: Right Arm)   Pulse 81   Temp 97.6 F (36.4 C) (Oral)   Resp 16   Ht 6' (1.829 m)   Wt 79 kg   SpO2 99%   BMI 23.62 kg/m    General appearance: alert,  cooperative and no distress Cardio: regular rate and rhythm, S1, S2 normal, no murmur, click, rub or gallop Pulm: CTA bilaterally and in all fields Abd: normal bowel sounds Extremities: extremities normal, atraumatic, no cyanosis or edema Neurologic: Grossly normal  Diagnostic Studies & Laboratory data:     Recent Radiology Findings:   ECHO TEE  Result Date: 10/05/2020    TRANSESOPHOGEAL ECHO REPORT   Patient Name:   Patrick Anthony Date of Exam: 10/05/2020 Medical Rec #:  [id number]      Height:       72.0 in Accession #:    [id number]     Weight:       174.2 lb Date of Birth:  1971/02/09      BSA:          2.009 m Patient Age:    13 years       BP:           170/67 mmHg Patient Gender: M  HR:           84 bpm. Exam Location:  Inpatient Procedure: Transesophageal Echo, Cardiac Doppler and Color Doppler Indications:     Bacteremia  History:         Patient has prior history of Echocardiogram examinations, most                  recent 09/28/2020. CHF, Mitral Valve Disease and Endocarditis,                  Signs/Symptoms:Bacteremia; Risk Factors:Hypertension. ESRD,                  Hep. C, Sepsis.  Sonographer:     Dustin Flock Referring Phys:  [id number] Darreld Mclean Diagnosing Phys: Lyman Bishop MD PROCEDURE: The transesophogeal probe was passed without difficulty through the esophogus of the patient. Sedation performed by performing physician. The patient was monitored while under deep sedation. Anesthestetic sedation was provided intravenously by  Anesthesiology: 231.'83mg'$  of Propofol, '40mg'$  of Lidocaine. The patient developed no complications during the procedure. IMPRESSIONS  1. Left ventricular ejection fraction, by estimation, is 60 to 65%. The left ventricle has normal function. There is moderate left ventricular hypertrophy.  2. Right ventricular systolic function is normal. The right ventricular size is normal.  3. Left atrial size was moderately dilated. No left atrial/left atrial  appendage thrombus was detected.  4. Right atrial size was moderately dilated.  5. Large vegetation on the mitral valve.  6. The posterior mitral leaflet appears perforated. There is extensive vegetation of both mitral leaflets, visualized with 2D/3D methods. The mitral valve is abnormal. Mild to moderate mitral valve regurgitation.  7. The aortic valve is tricuspid. Aortic valve regurgitation is not visualized.  8. There is mild (Grade II) protruding plaque involving the transverse and descending aorta.  9. Agitated saline contrast bubble study was negative, with no evidence of any interatrial shunt. Conclusion(s)/Recommendation(s): Findings are concerning for vegetation/infective endocarditis as detailed above. CT surgery consultation for mitral valve replacement/debridement is recommended. FINDINGS  Left Ventricle: Left ventricular ejection fraction, by estimation, is 60 to 65%. The left ventricle has normal function. The left ventricular internal cavity size was normal in size. There is moderate left ventricular hypertrophy. Right Ventricle: The right ventricular size is normal. No increase in right ventricular wall thickness. Right ventricular systolic function is normal. Left Atrium: Left atrial size was moderately dilated. No left atrial/left atrial appendage thrombus was detected. Right Atrium: Right atrial size was moderately dilated. Pericardium: There is no evidence of pericardial effusion. Mitral Valve: The posterior mitral leaflet appears perforated. There is extensive vegetation of both mitral leaflets, visualized with 2D/3D methods. The mitral valve is abnormal. There is severe thickening of the anterior and posterior mitral valve leaflet(s). A large vegetation is seen on the both mitral leaflet. The MV vegetation measures 10 mm x 15 mm. Mild to moderate mitral valve regurgitation. Tricuspid Valve: The tricuspid valve is grossly normal. Tricuspid valve regurgitation is trivial. Aortic Valve: The  aortic valve is tricuspid. Aortic valve regurgitation is not visualized. Pulmonic Valve: The pulmonic valve was normal in structure. Pulmonic valve regurgitation is not visualized. Aorta: The aortic root and ascending aorta are structurally normal, with no evidence of dilitation. There is mild (Grade II) protruding plaque involving the transverse and descending aorta. IAS/Shunts: No atrial level shunt detected by color flow Doppler. Agitated saline contrast was given intravenously to evaluate for intracardiac shunting. Agitated saline contrast bubble study was negative, with  no evidence of any interatrial shunt. Lyman Bishop MD Electronically signed by Lyman Bishop MD Signature Date/Time: 10/05/2020/2:34:23 PM    Final      I have independently reviewed the above radiologic studies and discussed with the patient   Recent Lab Findings: Lab Results  Component Value Date   WBC 20.6 (H) 10/05/2020   HGB 8.7 (L) 10/05/2020   HCT 26.1 (L) 10/05/2020   PLT 230 10/05/2020   GLUCOSE 115 (H) 10/05/2020   CHOL 126 09/29/2020   TRIG 342 (H) 09/29/2020   HDL <10 (L) 09/29/2020   LDLDIRECT 25.4 09/30/2020   LDLCALC NOT CALCULATED 09/29/2020   ALT 13 10/05/2020   AST 25 10/05/2020   NA 134 (L) 10/05/2020   K 4.0 10/05/2020   CL 94 (L) 10/05/2020   CREATININE 7.85 (H) 10/05/2020   BUN 53 (H) 10/05/2020   CO2 25 10/05/2020   TSH 2.006 08/28/2013   INR 1.1 10/04/2020   HGBA1C 5.5 09/29/2020      Assessment / Plan:      1. 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 End stage renal disease, dialysis dependent 3. Hypertension-Continue Norvasc and Clonodine 4. OSA 5. Embolic stroke-neurology following, continue SLP, PT/OT 6. Tobacco abuse-patient denies today, documented cessation 2019 7. Hx Liver cirrhosis due to hepatitis C 8. Alcohol abuse-patient denies today 9. Congestive diastolic heart  failure-estimated EF is 60-65%   Plan: Mitral valve endocarditis explained to the patient. He does not have any family/friends that will be present for conversation with Dr. Roxy Manns at [facility] tomorrow. Patient very fatigued today and not engaging in conversation much.    I  spent 30 minutes counseling the patient face to face.   Nicholes Rough, PA-C 10/06/2020 11:11 AM     I have seen and examined the patient and agree with the assessment as outlined above by Nicholes Rough, PA-C.  Patient is a 50 year old African-American male with history of end-stage renal disease secondary to polycystic kidney disease on hemodialysis since January 2015, longstanding poorly controlled hypertension, active hepatitis C with history of cirrhosis, history of medical noncompliance and polysubstance abuse, obstructive sleep apnea, and chronic combined systolic and diastolic congestive heart failure who has been referred for surgical consultation to discuss treatment options for management of 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   Patient denies any history of heart murmur or previous history of rheumatic fever or any other type of heart disease.  He has been followed intermittently over the last several years by Dr. Radford Pax for poorly controlled hypertension with chronic combined systolic and diastolic congestive heart failure.  Echocardiogram performed March 2021 revealed severe left ventricular hypertrophy which had progressed in comparison with previous echocardiograms with ejection fraction estimated 55 to 60%.  There was no sign of significant mitral valve disease at the time.  A small patent foramen ovale was noted.  Patient states that he was in his usual state of health until approximately 2 weeks ago when he developed relatively sudden onset of history of fever, chills,  generalized weakness and poor appetite.  He presented to the emergency department September 27, 2020 where he was febrile greater than 103 F and tachycardic with stable blood pressure.  Admission laboratory data revealed leukocytosis and lactic acidosis.  Chest x-ray revealed mild opacity at the left lung base.  CT scan  of the chest, abdomen, and pelvis revealed no acute findings although there was a small cavitary mass in the left upper lobe.  Blood cultures ultimately grew methicillin-resistant Staphylococcus aureus.  The patient was started on empiric antibiotics for presumed pneumonia.  The patient developed acute mental status changes with increased lethargy and code stroke was activated.  CT of the head revealed 10 mm infarct in the medial left cerebellar hemisphere and MRI was recommended.  MRI confirmed the presence of multiple small acute infarcts in the bilateral cerebral hemispheres, right basal ganglia, callosal splenium and bilateral cerebellar hemispheres with findings highly suspicious for an embolic process.  There was also foci of chronic hemorrhage within the bilateral cerebellar hemispheres and cerebellar vermis with a few scattered supratentorial chronic microhemorrhages mixed with mild degenerative atrophy of the brain.  MRA revealed no intracranial large vessel occlusion nor proximal high-grade arterial stenosis.  Transthoracic echocardiogram performed September 28, 2020 revealed normal left ventricular systolic function with left ventricular hypertrophy.  There was felt to be mild to moderate mitral regurgitation.  There was trivial aortic insufficiency.  Agitated saline contrast bubble study was negative.  Transesophageal echocardiogram was recommended to exclude cardiac source of embolism.  The patient was seen in consultation by the neurology and infectious disease teams.  Clinically the patient's mental status improved and the patient defervesced on antibiotic therapy.  White blood count  initially rose to as high as 41,800 but has slowly decreased since then to 20,600 on October 05, 2020.  TEE performed October 05, 2020 revealed extensive bulky vegetations on both mitral valve leaflets with possible perforation of the posterior leaflet and mild to moderate mitral regurgitation.  The aortic valve appeared normal.  Left atrium was moderately dilated.  Left and right ventricular systolic function were normal.  Cardiothoracic surgical consultation was requested.  Patient currently is awake and alert and conversant.  He answers all questions appropriately.  The patient states that he has lived alone in [place] since he first went on dialysis approximately 6 years ago.  He lives alone and has no family nearby.  He has not worked for many years.  He flatly reports that he has no family nor friends.  He reports that he currently feels much better than he did at the time of hospital admission.  Fevers and feeling of hot and cold has resolved.  Appetite is improved.  Bowel function is normal.  He states that he has been up and on his feet some but he has not been out of the room.  When asked why he did not participate with the physical therapy consultant he states that he "did not feel like it".  He denies any chest pain with or without activity.  He denies any pleuritic chest pain.  He has not had any productive cough or hemoptysis.  He denies any exertional shortness of breath.  He has not had resting shortness of breath, PND, orthopnea, nor lower extremity edema.  He states that he does not make any urine.  He states that he has not had any problems with dialysis recently.  He dialyzes on a Monday Wednesday Friday schedule at the [facility] dialysis center through left upper arm AV fistula.  He states that he has not had any problems with dialysis access recently.  I have personally reviewed the patient's medical record and examined the patient at the bedside.  I have reviewed the patient's multiple  radiographic tests and echocardiograms.  Patient has methicillin resistant Staph aureus  bacterial endocarditis.  Although the original portal of entry remains unclear in the setting of chronic hemodialysis the presumed portal of entry is likely related to dialysis treatment.  The patient's endocarditis has been complicated by septic embolization to the brain with multiple small embolic strokes.  The patient does not appear to have significant residual neurologic sequelae.  Clinically the patient seems to be doing well on medical therapy.  His fevers have resolved and white blood count is slowly trending towards normal.  He reports that his appetite is improved and his bowel function is normal.  He does not have any signs nor symptoms of congestive heart failure and he remains in sinus rhythm without AV conduction delay.  Transesophageal echocardiogram reveals bulky vegetations adherent to both the anterior and posterior leaflet of the mitral valve.  There is mild to moderate mitral regurgitation and there may be localized perforation of the posterior leaflet, although mitral regurgitation is not severe.  There is no sign of paravalvular abscess nor other valve related complications of the patient's endocarditis.  Options include continued observation on medical therapy versus high risk surgical intervention to include mitral valve repair or replacement.  Although there are no absolute indications for surgical intervention at this time, the presence of extremely bulky vegetations with likely significant damage to the mitral valve and poor prognosis associated with methicillin-resistant Staphylococcus aureus infections increases the chance that the patient may not do well on long-term antibiotic therapy without surgical intervention.  I discussed the nature of the patient's clinical problem with the patient at the bedside.  I explained alternative treatment options including the possibility that the patient's  endocarditis could be successfully treated with antibiotics alone.  I explained the reasons why he would be at risk for further complications to include persistent and/or recurrent infection, recurrent embolization, or development of acute congestive heart failure.  I discussed the high risk nature of surgical intervention and concerns regarding his long-term prognosis even with uncomplicated mitral valve repair or replacement.  I also discussed the fact that because of his underlying social circumstances and living arrangements he will likely need at least short-term placement in some type of rehab facility while he is recovering from his current illness.  All of his questions have been addressed.  As a next step the patient should undergo left and right heart catheterization.  He will need dental service consultation.  We need to establish whether or not he will actively participate with his rehabilitation to include participation with physical therapy, occupational therapy, nutritional support, and ultimately with case management.  We will continue to follow along closely.   I spent in excess of 120 minutes during the conduct of this hospital encounter and >50% of this time involved direct face-to-face encounter with the patient for counseling and/or coordination of their care.    Rexene Alberts, MD 10/06/2020 11:11 AM

## 2020-10-05 NOTE — Anesthesia Postprocedure Evaluation (Signed)
Anesthesia Post Note  Patient: Patrick Anthony  Procedure(s) Performed: TRANSESOPHAGEAL ECHOCARDIOGRAM (TEE) (N/A ) BUBBLE STUDY     Patient location during evaluation: PACU Anesthesia Type: MAC Level of consciousness: awake and alert and oriented Pain management: pain level controlled Vital Signs Assessment: post-procedure vital signs reviewed and stable Respiratory status: spontaneous breathing, nonlabored ventilation and respiratory function stable Cardiovascular status: blood pressure returned to baseline Postop Assessment: no apparent nausea or vomiting Anesthetic complications: no   No complications documented.  Last Vitals:  Vitals:   10/05/20 0911 10/05/20 1018  BP: 127/79 (!) 170/67  Pulse: 77 84  Resp: 15 (!) 35  Temp: 36.7 C (!) 36.3 C  SpO2: 100% 100%    Last Pain:  Vitals:   10/05/20 1030  TempSrc:   PainSc: 0-No pain                 Brennan Bailey

## 2020-10-05 NOTE — CV Procedure (Signed)
TRANSESOPHAGEAL ECHOCARDIOGRAM (TEE) NOTE  INDICATIONS: infective endocarditis  PROCEDURE:   Informed consent was obtained prior to the procedure. The risks, benefits and alternatives for the procedure were discussed and the patient comprehended these risks.  Risks include, but are not limited to, cough, sore throat, vomiting, nausea, somnolence, esophageal and stomach trauma or perforation, bleeding, low blood pressure, aspiration, pneumonia, infection, trauma to the teeth and death.    After a procedural time-out, the patient was given propofol per anesthesia for sedation.  The patient's heart rate, blood pressure, and oxygen saturation are monitored continuously during the procedure.The oropharynx was anesthetized 10 cc of topical 1% viscous lidocaine and cetacaine spray.  The transesophageal probe was inserted in the esophagus and stomach without difficulty and multiple views were obtained.  The patient was kept under observation until the patient left the procedure room.  I was present face-to-face 100% of this time. The patient left the procedure room in stable condition.   Agitated microbubble saline contrast was administered.  COMPLICATIONS:    There were no immediate complications.  Findings:  1. LEFT VENTRICLE: The left ventricular wall thickness is moderately increased.  The left ventricular cavity is normal in size. Wall motion is normal.  LVEF is 60-65%.  2. RIGHT VENTRICLE:  The right ventricle is normal in structure and function without any thrombus or masses.    3. LEFT ATRIUM:  The left atrium is moderately dilated in size without any thrombus or masses.  There is not spontaneous echo contrast ("smoke") in the left atrium consistent with a low flow state.  4. LEFT ATRIAL APPENDAGE:  The large left atrial appendage is free of any thrombus or masses. The appendage has single lobes. Pulse doppler indicates moderate flow in the appendage.  5. ATRIAL SEPTUM:  The atrial  septum appears intact and is free of thrombus and/or masses.  There is no evidence for interatrial shunting by color doppler and saline microbubble.  6. RIGHT ATRIUM:  The right atrium is moderately dilated in size and function without any thrombus or masses.  7. MITRAL VALVE:  The mitral valve is significantly abnormal, demonstrating a large burden of vegetations involving both leaflets, visualized in 2D/3D. There appears to be a perforation of the posterior mitral leaflet. There is  mild to moderate regurgitation regurgitation.  There was no stenosis.  8. AORTIC VALVE:  The aortic valve is trileaflet, normal in structure and function with no regurgitation.  There were no vegetations or stenosis  9. TRICUSPID VALVE:  The tricuspid valve is normal in structure and function with trivial regurgitation.  There were no vegetations or stenosis  10.  PULMONIC VALVE:  The pulmonic valve is normal in structure and function with no regurgitation.  There were no vegetations or stenosis.   11. AORTIC ARCH, ASCENDING AND DESCENDING AORTA:  There was grade 2 Ron Parker et. Al, 1992) atherosclerosis of the ascending aorta, aortic arch, or proximal descending aorta.  12. PULMONARY VEINS: Anomalous pulmonary venous return was not noted.  13. PERICARDIUM: The pericardium appeared normal and non-thickened.  There is no pericardial effusion.  IMPRESSION:   1. 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 Mild to moderate mitral regurgitation. 2. No LAA thrombus 3. Negative for PFO 4. Moderate biatrial enlargement 5. LVEF 60-65% with normal wall motion, moderate LVH  RECOMMENDATIONS:    1. Recommend CT surgical evaluation for possible mitral valve replacement/debridement.  Time Spent Directly with the Patient:  45 minutes  Pixie Casino, MD, [facility], [place] Director of the [facility]   [facility] Diplomate of the American Board of Clinical Lipidology Attending Cardiologist  Direct Dial: [phone]  Fax: [phone]  Website:  [email or web address] Valerie Cones 10/05/2020, 10:14 AM

## 2020-10-05 NOTE — Progress Notes (Signed)
Occupational Therapy Treatment Patient Details Name: Patrick Anthony MRN: [id number] DOB: 11/06/1970 Today's Date: 10/05/2020    History of present illness 50 y.o. male persenting with fever, chills, and generalized weakness. Patient found to have MRSA bacteremia with concerns for possible infective endocaarditis (TEE pending). MRI (+) multiple scattered small infarcts bilaterally. PMHx significant for CKD on HD MWF, Hx polysubstance abuse, ETOH abuse, hepatitis, HTN, and chronic combined diastolic and systolic CHF.   OT comments  Patient met lying in recliner reporting fatigue from procedure this a.m. Patient alert and oriented with ability to state name/DOB, place, and situation. Patient also able to report home set-up stating that he lives in a 2nd floor apartment with full flight of steps and bilateral handrails to front door. Please refer to "general comments" section for more details. Patient with significantly improved mentation this date compared to initial evaluation. Patient states that he was independent with ADLs/IADLs including driving PTA. Patient does not work. Patient able to follow commands necessary for formal assessment of vision and strength but declines ADLs and mobility this date despite education on importance of working with therapy to establish d/c plan. Patient would benefit from continued acute OT services in prep for safe d/c to next level of care. Given limited treatment session, d/c recommendation remains unchanged at this time. Will continue to assess at time of next treatment session. Patient may progress beyond need for SNF rehab.    Follow Up Recommendations  SNF;Supervision/Assistance - 24 hour    Equipment Recommendations  Other (comment)    Recommendations for Other Services      Precautions / Restrictions Precautions Precautions: Fall Restrictions Weight Bearing Restrictions: No       Mobility Bed Mobility               General bed mobility  comments: Patient seated in recliner upon entry.    Transfers                 General transfer comment: Patient declined.    Balance                                           ADL either performed or assessed with clinical judgement   ADL                                         General ADL Comments: Patient declined ADLs 2/2 fatigue from procedure this a.m.     Vision   Additional Comments: OT will continue to assess. Patient reports no changes in vision. Able to correctly identify time on wall clock.   Perception     Praxis      Cognition Arousal/Alertness: Awake/alert Behavior During Therapy: Flat affect Overall Cognitive Status: No family/caregiver present to determine baseline cognitive functioning                                 General Comments: Patient oriented to person, place, and situation. Able to answer therapists questions about home set up and follow commands necessary for assessment of vision and strength. Patient with significantly improved cognition compared to initial eval. No family present to confirm baseline cognition.        Exercises  Shoulder Instructions       General Comments Patient able to provide home set-up reporting living in a 2nd floor apartment with full flight of steps and bilateral rails to front door. Patient states that he has a walk-in shower with built-in seat and standard height commode. Grab bars and [place] in shower. Patient does not have DME.    Pertinent Vitals/ Pain       Pain Assessment: No/denies pain  Home Living                                          Prior Functioning/Environment              Frequency  Min 2X/week        Progress Toward Goals  OT Goals(current goals can now be found in the care plan section)  Progress towards OT goals: Not progressing toward goals - comment (Patient declined all ADL tasks and functional  mobility this date 2/2 fatigue.)  Acute Rehab OT Goals Patient Stated Goal: to return home OT Goal Formulation: With patient Time For Goal Achievement: 10/13/20 Potential to Achieve Goals: Good ADL Goals Pt Will Perform Eating: Independently;sitting Pt Will Perform Grooming: with supervision;standing Pt Will Perform Upper Body Dressing: with supervision;sitting Pt Will Perform Lower Body Dressing: with supervision;sit to/from stand Pt Will Transfer to Toilet: with supervision;ambulating Pt Will Perform Toileting - Clothing Manipulation and hygiene: with supervision;sit to/from stand Additional ADL Goal #1: Patient will attend to 1 seated grooming task for >3 min with no more than Min cues demonstrating increased attention.  Plan Discharge plan remains appropriate;Frequency remains appropriate    Co-evaluation                 AM-PAC OT "6 Clicks" Daily Activity     Outcome Measure   Help from another person eating meals?: Total Help from another person taking care of personal grooming?: Total Help from another person toileting, which includes using toliet, bedpan, or urinal?: Total Help from another person bathing (including washing, rinsing, drying)?: Total Help from another person to put on and taking off regular upper body clothing?: Total Help from another person to put on and taking off regular lower body clothing?: Total 6 Click Score: 6    End of Session    OT Visit Diagnosis: Unsteadiness on feet (R26.81);Other abnormalities of gait and mobility (R26.89);Muscle weakness (generalized) (M62.81);Feeding difficulties (R63.3);Adult, failure to thrive (R62.7)   Activity Tolerance Patient limited by fatigue   Patient Left in chair;with call bell/phone within reach;with chair alarm set   Nurse Communication          Time: 8144-8185 OT Time Calculation (min): 9 min  Charges: OT General Charges $OT Visit: 1 Visit OT Treatments $Therapeutic Activity: 8-22  mins  Samella Lucchetti H. OTR/L Supplemental OT, Department of rehab services [phone]   Amoy Steeves R H. 10/05/2020, 3:00 PM

## 2020-10-05 NOTE — Progress Notes (Signed)
SLP Cancellation Note  Patient Details Name: Patrick Anthony MRN: [id number] DOB: [birth year]   Cancelled treatment:       Reason Eval/Treat Not Completed: Patient at procedure or test/unavailable (TEE)    Osie Bond., M.A. [facility] Pager [phone] Office [phone]  10/05/2020, 10:03 AM

## 2020-10-05 NOTE — Progress Notes (Signed)
OT Cancellation Note  Patient Details Name: Patrick Anthony MRN: [id number] DOB: [birth year]   Cancelled Treatment:    Reason Eval/Treat Not Completed: Patient off floor for TEE. OT to check back as time allows.   Gloris Manchester OTR/L Supplemental OT, Department of rehab services [phone]   Makila Colombe R H. 10/05/2020, 9:52 AM

## 2020-10-05 NOTE — Progress Notes (Signed)
PROGRESS NOTE    Patrick Anthony  [id number] DOB: 20-May-1971 DOA: 09/27/2020 PCP: [name], MD  Chief Complaint  Patient presents with  . Generalized Body Aches    Brief Narrative:  50 year old male with medical history of end-stage renal disease due to APKD on hemodialysis MWF through left upper extremity AV graft, hypertension, OSA, chronic diastolic CHF, EF 55 to [id number], liver cirrhosis due to hepatitis C, history of alcohol abuse came to hospital with generalized weakness.  He was admitted with sepsis due to suspected pneumonia and started on antibiotics.  Code stroke was called on hospital day 1 and imaging showed embolic strokes.  His blood cultures were positive for staph aureus and he's had TEE on 3/1 which is concerning for mitral valve endocarditis.  Currently on vancomycin.  ID, neurology, cardiology, and CT surgery are follow.     Assessment & Plan:   Principal Problem:   Pneumonia due to infectious agent Active Problems:   Chronic diastolic CHF (congestive heart failure) (HCC)   HTN (hypertension)   Anemia in chronic kidney disease   ESRD on dialysis ([place])   Sepsis ([place])   ADPKD (autosomal dominant polycystic kidney disease)   Sepsis due to pneumonia (HCC)   Lactic acidosis   Cerebral embolism with cerebral infarction   Bacteremia due to Staphylococcus aureus  1. Mitral Valve Endocarditis  MRSA Bacteremia  Endocarditis  Possible Pneumonia 1. TEE 3/1 showed extensive MV endocarditis involving both leaflets with probable perforation of the posterior leaflet (see report) 2. CT surgery has been consulted 3. Had planned for LP, though AMS improving - holding off LP per neurology 4. 2/21 cx with MRSA bacteremia - sensitive to vancomycin 5. Repeat blood cultures 2/24 (1/2 sets persistently positive with staph aureus) 6. Repeat blood cx 2/27 NGTDx2 7. CT abdomen pelvis without contrast on presentation with findings c/w PCKD, equivocal thickening of urinary bladder, no  abscess in abdomen/pelvis (see report) 8. CXR 2/21 with streaky L basilar opacities 9. No thoracolumbar discitis-osteomyelitis or epidural collection (see imaging result) 2/24 10. CT chest abdomen pelvis 2/23 with cavitary mass in LUL apex (2/2 cavitary infection vs septic embolus), small foci of consolidation and ground glass density in RUL and small ground glass nodule in LUL (infectious vs inflammatory), polycystic kidney disease, no CT evidence for acute intraabdominal or pelvic abnormality.  Calcified mass anterior to R shoulder with smller L paraspinal soft tissue calcification probably due to calcinosis. 11. Continue vancomycin per ID 12. Persistent leukocytosis - improving  2. New onset aphasia  Scattered Small Acute Infarcts within Bilateral Cerebral Hemispheres, Right Basal Ganglia, Callosal Splenium, and Bilateral Cerebellar Hemispheres  Concerning for Embolic Process: 1. Concerning for embolic stroke from endocarditis  2. Echo with EF 55-60%, grade II diastolic dysfunction (no intracardiac source of embolism detected on transthoracic study) 3. MRA head/neck without appreciable stenosis in common carotid, internal carotid, vertebral arteries.  No intracranial LVO or proximal high grade arterial stenosis.  4. HDL <10, LDL not calculated - A1c 5.5 5. Holding anitplatelets/anticoagulants at this time until further w/u to rule out endocarditis and mycotic aneurysms 6. Neurology consult, appreciate recommendations (signed off 3/1 - see note)  3. Acute Metabolic Encephalopathy 1. Likely related to infection above -> seems to be gradually improving today 2. Considered LP in setting of AMS, improving at this time - deferred per neurology  4. Seizure Like Activity of Upper Extremities 1. LTM EEG per neurology - mod diffuse encephalopathy  2. 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 ESRD due to adult polycystic kidney disease  Hyperkalemia  AGMA-patient gets dialysis MWF  through left upper extremity fistula, continue PhosLo for hyperphosphatemia, Sensipar for secondary hyperparathyroidism.  1. Nephrology following.  6. Anemia of chronic disease  Iron Def Anemia 1. Relatively stable -> labs c/w aocd and iron def 2. Continue to monitor.  3. Patient is on ESA as outpatient.  7. Hypertension  1. Clonidine patch while not consistently taking PO -> BP's ok for now, will need to transition back to PO clonidine  2. BP improved 3. IV hydralazine prn 4. Resume PO metoprolol, amlodipine, hydral  8. Sinus Tachycardia 1. 2/2 pain vs rebound tachycardia with metop on hold vs worsening infection (with worsening leukocytosis) 2. Improved, continue metop  9. Liver cirrhosis due to hepatitis C-patient has chronic HCV infection, HIV Shelda Pal was nonreactive during prior hospital stay. 1. Elevated INR - imaging does not comment on cirrhosis or focal liver abnormality - no biliary dilatation - follow  10. Abnormal Chest CT 1. Short interval follow up recommended  11. Thrombocytopenia 1. resolved  DVT prophylaxis: SCD Code Status: full  Family Communication: none at bedside - no answer daughter 3/1 Disposition:   Status is: Inpatient  Remains inpatient appropriate because:Inpatient level of care appropriate due to severity of illness   Dispo: The patient is from: Home              Anticipated d/c is to: pending              Anticipated d/c date is: > 3 days              Patient currently is not medically stable to d/c.   Difficult to place patient No  Consultants:   Renal  ID  Neurology  cardiology  Procedures:  TTE IMPRESSIONS    1. Left ventricular ejection fraction, by estimation, is 55 to 60%. The  left ventricle has normal function. The left ventricle has no regional  wall motion abnormalities. There is moderate concentric left ventricular  hypertrophy. Left ventricular  diastolic parameters are consistent with Grade II diastolic  dysfunction  (pseudonormalization).  2. Right ventricular systolic function is normal. The right ventricular  size is normal. There is normal pulmonary artery systolic pressure.  3. Left atrial size was moderately dilated.  4. Right atrial size was mild to moderately dilated.  5. The mitral valve is normal in structure. Mild to moderate mitral valve  regurgitation. No evidence of mitral stenosis. Moderate to severe mitral  annular calcification.  6. The aortic valve is tricuspid. Aortic valve regurgitation is trivial.  No aortic stenosis is present.  7. The inferior vena cava is normal in size with greater than 50%  respiratory variability, suggesting right atrial pressure of 3 mmHg.  8. Agitated saline contrast bubble study was negative, with no evidence  of any interatrial shunt.   Conclusion(s)/Recommendation(s): No intracardiac source of embolism  detected on this transthoracic study. Kiki Bivens transesophageal echocardiogram is  recommended to exclude cardiac source of embolism if clinically indicated.  TEE IMPRESSIONS    1. Left ventricular ejection fraction, by estimation, is 60 to 65%. The  left ventricle has normal function. There is moderate left ventricular  hypertrophy.  2. Right ventricular systolic function is normal. The right ventricular  size is normal.  3. Left atrial size was moderately dilated. No left atrial/left atrial  appendage thrombus was detected.  4. Right atrial size was moderately dilated.  5. Large vegetation on  the mitral valve.  6. The posterior mitral leaflet appears perforated. There is extensive  vegetation of both mitral leaflets, visualized with 2D/3D methods. The  mitral valve is abnormal. Mild to moderate mitral valve regurgitation.  7. The aortic valve is tricuspid. Aortic valve regurgitation is not  visualized.  8. There is mild (Grade II) protruding plaque involving the transverse  and descending aorta.  9. Agitated saline  contrast bubble study was negative, with no evidence  of any interatrial shunt.   Conclusion(s)/Recommendation(s): Findings are concerning for  vegetation/infective endocarditis as detailed above. CT surgery  consultation for mitral valve replacement/debridement is recommended.   Antimicrobials: Anti-infectives (From admission, onward)   Start     Dose/Rate Route Frequency Ordered Stop   10/08/20 1200  vancomycin (VANCOCIN) IVPB 750 mg/150 ml premix        750 mg 150 mL/hr over 60 Minutes Intravenous Every M-W-F (Hemodialysis) 10/05/20 1404     10/06/20 1200  vancomycin (VANCOCIN) IVPB 750 mg/150 ml premix  Status:  Discontinued        750 mg 150 mL/hr over 60 Minutes Intravenous Every M-W-F (Hemodialysis) 10/04/20 0905 10/05/20 1404   10/06/20 1200  vancomycin (VANCOCIN) IVPB 500 mg/100 ml premix        500 mg 100 mL/hr over 60 Minutes Intravenous Every Wed (Hemodialysis) 10/05/20 1404 10/13/20 1159   10/01/20 2000  DAPTOmycin (CUBICIN) 840 mg in sodium chloride 0.9 % IVPB  Status:  Discontinued        840 mg 233.6 mL/hr over 30 Minutes Intravenous Every 48 hours 10/01/20 0922 10/01/20 1056   10/01/20 1200  vancomycin (VANCOCIN) IVPB 1000 mg/200 mL premix  Status:  Discontinued        1,000 mg 200 mL/hr over 60 Minutes Intravenous Every M-W-F (Hemodialysis) 10/01/20 1056 10/04/20 0845   09/29/20 1200  vancomycin (VANCOCIN) IVPB 1000 mg/200 mL premix  Status:  Discontinued        1,000 mg 200 mL/hr over 60 Minutes Intravenous Every M-W-F (Hemodialysis) 09/27/20 1443 10/01/20 0922   09/29/20 1200  ceFEPIme (MAXIPIME) 2 g in sodium chloride 0.9 % 100 mL IVPB  Status:  Discontinued        2 g 200 mL/hr over 30 Minutes Intravenous Every M-W-F (Hemodialysis) 09/27/20 1443 09/28/20 1054   09/28/20 0900  metroNIDAZOLE (FLAGYL) IVPB 500 mg  Status:  Discontinued        500 mg 100 mL/hr over 60 Minutes Intravenous Every 12 hours 09/27/20 1808 09/28/20 1054   09/28/20 0900  ceFEPIme  (MAXIPIME) 1 g in sodium chloride 0.9 % 100 mL IVPB  Status:  Discontinued        1 g 200 mL/hr over 30 Minutes Intravenous Every 12 hours 09/27/20 1808 09/27/20 1812   09/27/20 1445  vancomycin (VANCOREADY) IVPB 2000 mg/400 mL        2,000 mg 200 mL/hr over 120 Minutes Intravenous  Once 09/27/20 1433 09/27/20 2330   09/27/20 1430  ceFEPIme (MAXIPIME) 2 g in sodium chloride 0.9 % 100 mL IVPB        2 g 200 mL/hr over 30 Minutes Intravenous  Once 09/27/20 1418 09/27/20 1541   09/27/20 1430  metroNIDAZOLE (FLAGYL) IVPB 500 mg        500 mg 100 mL/hr over 60 Minutes Intravenous  Once 09/27/20 1418 09/27/20 2214   09/27/20 1430  vancomycin (VANCOREADY) IVPB 1750 mg/350 mL  Status:  Discontinued        1,750 mg 175 mL/hr over  120 Minutes Intravenous  Once 09/27/20 1418 09/27/20 1433     Subjective: No new complaints  Objective: Vitals:   10/05/20 1040 10/05/20 1042 10/05/20 1100 10/05/20 1500  BP: (!) 180/88 (!) 180/88 (!) 162/86 (!) 158/99  Pulse: 78 81 70 80  Resp: (!) '22 20 20 '$ (!) 22  Temp:      TempSrc:      SpO2: 100% 100% 100% 100%  Weight:      Height:        Intake/Output Summary (Last 24 hours) at 10/05/2020 1921 Last data filed at 10/05/2020 1019 Gross per 24 hour  Intake 600 ml  Output -  Net 600 ml   Filed Weights   10/04/20 0740 10/04/20 1055 10/05/20 0911  Weight: 81 kg 79 kg 79 kg    Examination:  General: No acute distress. Cardiovascular: RRR Lungs: unlabored Abdomen: Soft, nontender, nondistended  Neurological: Alert and oriented 3. Moves all extremities 4 . Cranial nerves II through XII grossly intact. Skin: Warm and dry. No rashes or lesions. Extremities: No clubbing or cyanosis. No edema.   Data Reviewed: I have personally reviewed following labs and imaging studies  CBC: Recent Labs  Lab 10/01/20 0818 10/02/20 0315 10/03/20 0242 10/04/20 0211 10/05/20 0431  WBC 38.5* 32.2* 31.1* 26.0* 20.6*  NEUTROABS 31.6* 29.3* 28.0* 23.9* 15.7*  HGB  9.1* 9.5* 8.6* 8.7* 8.7*  HCT 27.5* 27.6* 26.5* 25.3* 26.1*  MCV 80.6 80.9 81.8 80.3 82.6  PLT 191 185 294 217 [id number]    Basic Metabolic Panel: Recent Labs  Lab 10/01/20 0818 10/02/20 0315 10/03/20 0242 10/04/20 0211 10/05/20 0431  NA 138 135 133* 132* 134*  K 4.6 4.1 3.9 4.2 4.0  CL 96* 96* 91* 91* 94*  CO2 21* '23 23 23 25  '$ GLUCOSE 94 115* 123* 119* 115*  BUN 103* 54* 71* 93* 53*  CREATININE 11.05* 7.00* 8.93* 10.74* 7.85*  CALCIUM 8.5* 8.6* 8.2* 8.0* 7.6*  MG 3.1* 2.5* 2.6* 2.8* 2.5*  PHOS 7.5* 5.3* 6.0* 7.0* 6.0*    GFR: Estimated Creatinine Clearance: 12.5 mL/min (Gale Klar) (by C-G formula based on SCr of 7.85 mg/dL (H)).  Liver Function Tests: Recent Labs  Lab 10/01/20 0818 10/02/20 0315 10/03/20 0242 10/04/20 0211 10/05/20 0431  AST '27 25 19 22 25  '$ ALT '14 13 11 12 13  '$ ALKPHOS 80 78 75 75 70  BILITOT 1.6* 1.7* 1.1 1.2 1.1  PROT 7.7 7.8 7.3 7.4 7.6  ALBUMIN 2.4* 2.3* 2.2* 2.2* 2.2*    CBG: Recent Labs  Lab 10/02/20 1124  GLUCAP 122*     Recent Results (from the past 240 hour(s))  Blood Culture (routine x 2)     Status: Abnormal   Collection Time: 09/27/20  2:16 PM   Specimen: BLOOD RIGHT HAND  Result Value Ref Range Status   Specimen Description BLOOD RIGHT HAND  Final   Special Requests   Final    BOTTLES DRAWN AEROBIC AND ANAEROBIC Blood Culture results may not be optimal due to an inadequate volume of blood received in culture bottles Performed at [facility], [address] [place], [place] [address]    Culture  Setup Time   Final    GRAM POSITIVE COCCI IN CLUSTERS IN BOTH AEROBIC AND ANAEROBIC BOTTLES    Culture METHICILLIN RESISTANT STAPHYLOCOCCUS AUREUS (Lonie Rummell)  Final   Report Status 09/30/2020 FINAL  Final   Organism ID, Bacteria METHICILLIN RESISTANT STAPHYLOCOCCUS AUREUS  Final      Susceptibility  Methicillin resistant staphylococcus aureus - MIC*    CIPROFLOXACIN >=8 RESISTANT Resistant     ERYTHROMYCIN >=8 RESISTANT Resistant      GENTAMICIN <=0.5 SENSITIVE Sensitive     OXACILLIN >=4 RESISTANT Resistant     TETRACYCLINE <=1 SENSITIVE Sensitive     VANCOMYCIN <=0.5 SENSITIVE Sensitive     TRIMETH/SULFA <=10 SENSITIVE Sensitive     CLINDAMYCIN <=0.25 SENSITIVE Sensitive     RIFAMPIN <=0.5 SENSITIVE Sensitive     Inducible Clindamycin NEGATIVE Sensitive     * METHICILLIN RESISTANT STAPHYLOCOCCUS AUREUS  Resp Panel by RT-PCR (Flu Epifanio Labrador&B, Covid) Nasopharyngeal Swab     Status: None   Collection Time: 09/27/20  2:16 PM   Specimen: Nasopharyngeal Swab; Nasopharyngeal(NP) swabs in vial transport medium  Result Value Ref Range Status   SARS Coronavirus 2 by RT PCR NEGATIVE NEGATIVE Final    Comment: (NOTE) SARS-CoV-2 target nucleic acids are NOT DETECTED.  The SARS-CoV-2 RNA is generally detectable in upper respiratory specimens during the acute phase of infection. The lowest concentration of SARS-CoV-2 viral copies this assay can detect is 138 copies/mL. Guadalupe Nickless negative result does not preclude SARS-Cov-2 infection and should not be used as the sole basis for treatment or other patient management decisions. Othel Hoogendoorn negative result may occur with  improper specimen collection/handling, submission of specimen other than nasopharyngeal swab, presence of viral mutation(s) within the areas targeted by this assay, and inadequate number of viral copies(<138 copies/mL). Miquela Costabile negative result must be combined with clinical observations, patient history, and epidemiological information. The expected result is Negative.  Fact Sheet for Patients:  EntrepreneurPulse.com.au  Fact Sheet for Healthcare Providers:  IncredibleEmployment.be  This test is no t yet approved or cleared by the [place] FDA and  has been authorized for detection and/or diagnosis of SARS-CoV-2 by FDA under an Emergency Use Authorization (EUA). This EUA will remain  in effect (meaning this test can be used) for the duration of  the COVID-19 declaration under Section 564(b)(1) of the Act, 21 U.S.C.section 360bbb-3(b)(1), unless the authorization is terminated  or revoked sooner.       Influenza Hades Mathew by PCR NEGATIVE NEGATIVE Final   Influenza B by PCR NEGATIVE NEGATIVE Final    Comment: (NOTE) The Xpert Xpress SARS-CoV-2/FLU/RSV plus assay is intended as an aid in the diagnosis of influenza from Nasopharyngeal swab specimens and should not be used as Reba Hulett sole basis for treatment. Nasal washings and aspirates are unacceptable for Xpert Xpress SARS-CoV-2/FLU/RSV testing.  Fact Sheet for Patients: EntrepreneurPulse.com.au  Fact Sheet for Healthcare Providers: IncredibleEmployment.be  This test is not yet approved or cleared by the [place] FDA and has been authorized for detection and/or diagnosis of SARS-CoV-2 by FDA under an Emergency Use Authorization (EUA). This EUA will remain in effect (meaning this test can be used) for the duration of the COVID-19 declaration under Section 564(b)(1) of the Act, 21 U.S.C. section 360bbb-3(b)(1), unless the authorization is terminated or revoked.  Performed at [facility], [place] [address] [place], [place] [address]   Blood Culture ID Panel (Reflexed)     Status: Abnormal   Collection Time: 09/27/20  2:16 PM  Result Value Ref Range Status   Enterococcus faecalis NOT DETECTED NOT DETECTED Final   Enterococcus Faecium NOT DETECTED NOT DETECTED Final   Listeria monocytogenes NOT DETECTED NOT DETECTED Final   Staphylococcus species DETECTED (Torrence Branagan) NOT DETECTED Final    Comment: CRITICAL RESULT CALLED TO, READ BACK BY AND VERIFIED WITH: C,PIERCE PHARMD '@1009'$   09/28/20 EB    Staphylococcus aureus (BCID) DETECTED (Gweneth Fredlund) NOT DETECTED Final    Comment: Methicillin (oxacillin)-resistant Staphylococcus aureus (MRSA). MRSA is predictably resistant to beta-lactam antibiotics (except ceftaroline). Preferred therapy is vancomycin unless  clinically contraindicated. Patient requires contact precautions if  hospitalized. CRITICAL RESULT CALLED TO, READ BACK BY AND VERIFIED WITH: C,PIERCE PHARMD '@1009'$  09/28/20 EB    Staphylococcus epidermidis NOT DETECTED NOT DETECTED Final   Staphylococcus lugdunensis NOT DETECTED NOT DETECTED Final   Streptococcus species NOT DETECTED NOT DETECTED Final   Streptococcus agalactiae NOT DETECTED NOT DETECTED Final   Streptococcus pneumoniae NOT DETECTED NOT DETECTED Final   Streptococcus pyogenes NOT DETECTED NOT DETECTED Final   Taryll Reichenberger.calcoaceticus-baumannii NOT DETECTED NOT DETECTED Final   Bacteroides fragilis NOT DETECTED NOT DETECTED Final   Enterobacterales NOT DETECTED NOT DETECTED Final   Enterobacter cloacae complex NOT DETECTED NOT DETECTED Final   Escherichia coli NOT DETECTED NOT DETECTED Final   Klebsiella aerogenes NOT DETECTED NOT DETECTED Final   Klebsiella oxytoca NOT DETECTED NOT DETECTED Final   Klebsiella pneumoniae NOT DETECTED NOT DETECTED Final   Proteus species NOT DETECTED NOT DETECTED Final   Salmonella species NOT DETECTED NOT DETECTED Final   Serratia marcescens NOT DETECTED NOT DETECTED Final   Haemophilus influenzae NOT DETECTED NOT DETECTED Final   Neisseria meningitidis NOT DETECTED NOT DETECTED Final   Pseudomonas aeruginosa NOT DETECTED NOT DETECTED Final   Stenotrophomonas maltophilia NOT DETECTED NOT DETECTED Final   Candida albicans NOT DETECTED NOT DETECTED Final   Candida auris NOT DETECTED NOT DETECTED Final   Candida glabrata NOT DETECTED NOT DETECTED Final   Candida krusei NOT DETECTED NOT DETECTED Final   Candida parapsilosis NOT DETECTED NOT DETECTED Final   Candida tropicalis NOT DETECTED NOT DETECTED Final   Cryptococcus neoformans/gattii NOT DETECTED NOT DETECTED Final   Meth resistant mecA/C and MREJ DETECTED (Larrell Rapozo) NOT DETECTED Final    Comment: CRITICAL RESULT CALLED TO, READ BACK BY AND VERIFIED WITH: C,PIERCE PHARMD '@1009'$  09/28/20  EB Performed at [facility], [address], [place], [place] [address]   Blood Culture (routine x 2)     Status: Abnormal   Collection Time: 09/27/20  2:29 PM   Specimen: BLOOD  Result Value Ref Range Status   Specimen Description BLOOD SITE NOT SPECIFIED  Final   Special Requests   Final    BOTTLES DRAWN AEROBIC AND ANAEROBIC Blood Culture results may not be optimal due to an inadequate volume of blood received in culture bottles   Culture  Setup Time   Final    GRAM POSITIVE COCCI IN CLUSTERS ANAEROBIC BOTTLE ONLY CRITICAL VALUE NOTED.  VALUE IS CONSISTENT WITH PREVIOUSLY REPORTED AND CALLED VALUE.    Culture (Ivonne [place]burg)  Final    STAPHYLOCOCCUS AUREUS SUSCEPTIBILITIES PERFORMED ON PREVIOUS CULTURE WITHIN THE LAST 5 DAYS. Performed at [facility], [place] [address] [place], [place] [address]    Report Status 10/01/2020 FINAL  Final  Culture, blood (routine x 2)     Status: None   Collection Time: 09/30/20  6:04 AM   Specimen: BLOOD RIGHT FOREARM  Result Value Ref Range Status   Specimen Description BLOOD RIGHT FOREARM  Final   Special Requests   Final    BOTTLES DRAWN AEROBIC AND ANAEROBIC Blood Culture adequate volume   Culture   Final    NO GROWTH 5 DAYS Performed at [facility], [place] [address], [place], [place] [address]    Report Status  10/05/2020 FINAL  Final  Culture, blood (routine x 2)     Status: Abnormal   Collection Time: 09/30/20  6:08 AM   Specimen: BLOOD RIGHT HAND  Result Value Ref Range Status   Specimen Description BLOOD RIGHT HAND  Final   Special Requests   Final    BOTTLES DRAWN AEROBIC AND ANAEROBIC Blood Culture results may not be optimal due to an inadequate volume of blood received in culture bottles   Culture  Setup Time   Final    GRAM POSITIVE COCCI IN CLUSTERS ANAEROBIC BOTTLE ONLY CRITICAL VALUE NOTED.  VALUE IS CONSISTENT WITH PREVIOUSLY REPORTED AND CALLED VALUE.    Culture (Reeda Soohoo)  Final    STAPHYLOCOCCUS AUREUS SUSCEPTIBILITIES  PERFORMED ON PREVIOUS CULTURE WITHIN THE LAST 5 DAYS. Performed at [facility], [place] [address], [place], [place] [address]    Report Status 10/03/2020 FINAL  Final  Culture, blood (routine x 2)     Status: None (Preliminary result)   Collection Time: 10/03/20  2:42 AM   Specimen: BLOOD  Result Value Ref Range Status   Specimen Description BLOOD LEFT ARM  Final   Special Requests   Final    BOTTLES DRAWN AEROBIC AND ANAEROBIC Blood Culture results may not be optimal due to an inadequate volume of blood received in culture bottles   Culture   Final    NO GROWTH 2 DAYS Performed at [facility], [place] [address], [place], [place] [address]    Report Status PENDING  Incomplete  Culture, blood (routine x 2)     Status: None (Preliminary result)   Collection Time: 10/03/20  2:42 AM   Specimen: BLOOD  Result Value Ref Range Status   Specimen Description BLOOD LEFT HAND  Final   Special Requests   Final    BOTTLES DRAWN AEROBIC ONLY Blood Culture results may not be optimal due to an inadequate volume of blood received in culture bottles   Culture   Final    NO GROWTH 2 DAYS Performed at [facility], [place] [address], [place], [place] [address]    Report Status PENDING  Incomplete         Radiology Studies: ECHO TEE  Result Date: 10/05/2020    TRANSESOPHOGEAL ECHO REPORT   Patient Name:   Patrick Anthony Date of Exam: 10/05/2020 Medical Rec #:  [id number]      Height:       72.0 in Accession #:    [id number]     Weight:       174.2 lb Date of Birth:  10-16-1970      BSA:          2.009 m Patient Age:    82 years       BP:           170/67 mmHg Patient Gender: M              HR:           84 bpm. Exam Location:  Inpatient Procedure: Transesophageal Echo, Cardiac Doppler and Color Doppler Indications:     Bacteremia  History:         Patient has prior history of Echocardiogram examinations, most                  recent 09/28/2020. CHF, Mitral Valve Disease and Endocarditis,                   Signs/Symptoms:Bacteremia;  Risk Factors:Hypertension. ESRD,                  Hep. C, Sepsis.  Sonographer:     Dustin Flock Referring Phys:  [id number] Darreld Mclean Diagnosing Phys: Lyman Bishop MD PROCEDURE: The transesophogeal probe was passed without difficulty through the esophogus of the patient. Sedation performed by performing physician. The patient was monitored while under deep sedation. Anesthestetic sedation was provided intravenously by  Anesthesiology: 231.'83mg'$  of Propofol, '40mg'$  of Lidocaine. The patient developed no complications during the procedure. IMPRESSIONS  1. Left ventricular ejection fraction, by estimation, is 60 to 65%. The left ventricle has normal function. There is moderate left ventricular hypertrophy.  2. Right ventricular systolic function is normal. The right ventricular size is normal.  3. Left atrial size was moderately dilated. No left atrial/left atrial appendage thrombus was detected.  4. Right atrial size was moderately dilated.  5. Large vegetation on the mitral valve.  6. The posterior mitral leaflet appears perforated. There is extensive vegetation of both mitral leaflets, visualized with 2D/3D methods. The mitral valve is abnormal. Mild to moderate mitral valve regurgitation.  7. The aortic valve is tricuspid. Aortic valve regurgitation is not visualized.  8. There is mild (Grade II) protruding plaque involving the transverse and descending aorta.  9. Agitated saline contrast bubble study was negative, with no evidence of any interatrial shunt. Conclusion(s)/Recommendation(s): Findings are concerning for vegetation/infective endocarditis as detailed above. CT surgery consultation for mitral valve replacement/debridement is recommended. FINDINGS  Left Ventricle: Left ventricular ejection fraction, by estimation, is 60 to 65%. The left ventricle has normal function. The left ventricular internal cavity size was normal in size. There is moderate left  ventricular hypertrophy. Right Ventricle: The right ventricular size is normal. No increase in right ventricular wall thickness. Right ventricular systolic function is normal. Left Atrium: Left atrial size was moderately dilated. No left atrial/left atrial appendage thrombus was detected. Right Atrium: Right atrial size was moderately dilated. Pericardium: There is no evidence of pericardial effusion. Mitral Valve: The posterior mitral leaflet appears perforated. There is extensive vegetation of both mitral leaflets, visualized with 2D/3D methods. The mitral valve is abnormal. There is severe thickening of the anterior and posterior mitral valve leaflet(s). Janavia Rottman large vegetation is seen on the both mitral leaflet. The MV vegetation measures 10 mm x 15 mm. Mild to moderate mitral valve regurgitation. Tricuspid Valve: The tricuspid valve is grossly normal. Tricuspid valve regurgitation is trivial. Aortic Valve: The aortic valve is tricuspid. Aortic valve regurgitation is not visualized. Pulmonic Valve: The pulmonic valve was normal in structure. Pulmonic valve regurgitation is not visualized. Aorta: The aortic root and ascending aorta are structurally normal, with no evidence of dilitation. There is mild (Grade II) protruding plaque involving the transverse and descending aorta. IAS/Shunts: No atrial level shunt detected by color flow Doppler. Agitated saline contrast was given intravenously to evaluate for intracardiac shunting. Agitated saline contrast bubble study was negative, with no evidence of any interatrial shunt. Lyman Bishop MD Electronically signed by Lyman Bishop MD Signature Date/Time: 10/05/2020/2:34:23 PM    Final         Scheduled Meds: . amLODipine  10 mg Oral Daily  . calcium acetate  667 mg Oral TID WC  . calcium acetate  667 mg Oral With snacks  . [START ON 10/06/2020] Chlorhexidine Gluconate Cloth  6 each Topical Q0600  . cinacalcet  30 mg Oral Q breakfast  . cloNIDine  0.1 mg Transdermal  Weekly  . [  START ON 10/06/2020] darbepoetin (ARANESP) injection - DIALYSIS  100 mcg Intravenous Q Wed-HD  . doxercalciferol  3 mcg Intravenous Q M,W,F-HD  . feeding supplement (NEPRO CARB STEADY)  237 mL Oral BID BM  . hydrALAZINE  50 mg Oral BID  . metoprolol tartrate  75 mg Oral BID  . multivitamin  1 tablet Oral QHS  . [START ON 10/06/2020] vancomycin  500 mg Intravenous Q Wed-HD   Continuous Infusions: . [START ON 10/08/2020] vancomycin       LOS: 8 days    Time spent: over 30 min    Fayrene Helper, MD [facility]   To contact the attending provider between 7A-7P or the covering provider during after hours 7P-7A, please log into the web site [email or web address] and access using universal [place] password for that web site. If you do not have the password, please call the hospital operator.  10/05/2020, 7:21 PM

## 2020-10-05 NOTE — Progress Notes (Signed)
[facility] for Infectious Disease    Date of Admission:  09/27/2020   Total days of antibiotics 5           ID: Patrick Anthony is a 50 y.o. 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 Principal Problem:   Pneumonia due to infectious agent Active Problems:   Chronic diastolic CHF (congestive heart failure) (HCC)   HTN (hypertension)   Anemia in chronic kidney disease   ESRD on dialysis ([place])   Sepsis ([place])   ADPKD (autosomal dominant polycystic kidney disease)   Sepsis due to pneumonia (HCC)   Lactic acidosis   Cerebral embolism with cerebral infarction   Bacteremia due to Staphylococcus aureus    Subjective: No complaint No N/V/D/HA  Had tee this morning Left avg site never had pain or issue during dialysis  ROS: 12 point ros is negative   Medications:  . amLODipine  10 mg Oral Daily  . calcium acetate  667 mg Oral TID WC  . calcium acetate  667 mg Oral With snacks  . Chlorhexidine Gluconate Cloth  6 each Topical Q0600  . cinacalcet  30 mg Oral Q breakfast  . cloNIDine  0.1 mg Transdermal Weekly  . [START ON 10/06/2020] darbepoetin (ARANESP) injection - DIALYSIS  100 mcg Intravenous Q Wed-HD  . doxercalciferol  3 mcg Intravenous Q M,W,F-HD  . feeding supplement (NEPRO CARB STEADY)  237 mL Oral BID BM  . hydrALAZINE  50 mg Oral BID  . metoprolol tartrate  75 mg Oral BID  . multivitamin  1 tablet Oral QHS    Objective: Vital signs in last 24 hours: Temp:  [97.3 F (36.3 C)-99.3 F (37.4 C)] 97.3 F (36.3 C) (03/01 1018) Pulse Rate:  [70-88] 70 (03/01 1100) Resp:  [15-35] 20 (03/01 1100) BP: (115-180)/(63-90) 162/86 (03/01 1100) SpO2:  [99 %-100 %] 100 % (03/01 1100) Weight:  [79 kg] 79 kg (03/01 0911)  Physical Exam  Constitutional: no distress, conversant HEENT: conj clear; normocephalic; oropharynx clear Cardiovascular: rrr 2/6 systolic murmur Pulmonary/Chest: Effort normal; cta Abdominal: Soft/nt Neurological:  nonfocal Psych alert/oriented, mood apprpriate Skin no rash; left ue AVG site good thrill without swelling/fluctuance/tenderness    Lab Results Recent Labs    10/04/20 0211 10/05/20 0431  WBC 26.0* 20.6*  HGB 8.7* 8.7*  HCT 25.3* 26.1*  NA 132* 134*  K 4.2 4.0  CL 91* 94*  CO2 23 25  BUN 93* 53*  CREATININE 10.74* 7.85*   Liver Panel Recent Labs    10/04/20 0211 10/05/20 0431  PROT 7.4 7.6  ALBUMIN 2.2* 2.2*  AST 22 25  ALT 12 13  ALKPHOS 75 70  BILITOT 1.2 1.1    Microbiology: 2/24 blood cx NGTD 2/21 blood cx MRSA   Studies/Results: 2/22 brain mri 1. 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 Multiple vascular territories are involved and findings are highly suspicious for an embolic process. 2. 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 A few scattered supratentorial chronic microhemorrhages are also present. 3. Mild generalized atrophy of the brain  2/22 tte 1. Left ventricular ejection fraction, by estimation, is 55 to 60%. The  left ventricle has normal function. The left ventricle has no regional  wall motion abnormalities. There is moderate concentric left ventricular  hypertrophy. Left ventricular  diastolic parameters are consistent with Grade II diastolic dysfunction  (pseudonormalization).  2. Right ventricular systolic function is normal. The right ventricular  size is normal. There is normal pulmonary artery systolic pressure.  3. Left atrial size was moderately dilated.  4. Right atrial size was mild to moderately dilated.  5. The mitral valve is normal in structure. Mild to moderate mitral valve  regurgitation. No evidence of mitral stenosis. Moderate to severe mitral  annular calcification.  6. The aortic valve is tricuspid. Aortic valve regurgitation is trivial.  No aortic  stenosis is present.  7. The inferior vena cava is normal in size with greater than 50%  respiratory variability, suggesting right atrial pressure of 3 mmHg.  8. Agitated saline contrast bubble study was negative, with no evidence  of any interatrial shunt.  2/23 chest abd pelv ct 1. Cavitary mass in the left upper lobe apex measuring up to 2.2 cm with surrounding ground-glass density. Findings could be secondary to cavitary infection or potential septic embolus. A cavitary neoplasm is also in the differential. There are additional small foci of consolidation and ground-glass density in the right upper lobe and small ground-glass nodule in left upper lobe, potentially infectious or inflammatory. Short interval chest CT follow-up is recommended. 2. Polycystic kidney disease. 3. No CT evidence for acute intra-abdominal or pelvic abnormality. 4. Bulky calcified mass anterior to right shoulder with smaller left paraspinal soft tissue calcification probably due to calcinosis related to chronic kidney disease  2/24 mr lumbar thoracic 1. No thoracolumbar discitis-osteomyelitis or epidural collection. 2. Mild bone marrow edema and disc space edema at C6-C7, likely degenerative. 3. Mild L5-S1 degenerative disc disease without spinal canal or neural foraminal stenosis  Assessment/Plan: Abx: 2/21-c vanc  50yo M with ESRD dialyzed via lue gore-tex avg (placed 10/2019), hep c (no prior tx) admitted 2/21 with 10 day malaise, fever, found to have sepsis due to MRSA bacteremia c/b embolic stroke presumably from endocarditis  Complications: CNS septic emboli, pulmonary cavitary lesions/septic emboli. Has lower back pain but negative mri lumbar thoracic  Doesn't appear AVG clinically involved with mrsa bsi at this time. But if relapse after tx, should investigate further  2/21 & 2/24 bcx mrsa 2/27 bcx negative  Overall improving mentation/clinical status  -continue vancomycin -f/u  tee -pending hep c genotype; can f/u ID clinic for tx        [facility]  I spent more than 30 minute reviewing data/chart, and coordinating care and >50% direct face to face time providing counseling/discussing diagnostics/treatment plan with patient   10/05/2020, 1:07 PM

## 2020-10-05 NOTE — Progress Notes (Signed)
PT Cancellation Note  Patient Details Name: Patrick Anthony MRN: [id number] DOB: [birth year]   Cancelled Treatment:    Reason Eval/Treat Not Completed: Patient at procedure or test/unavailable (TEE).  Will check back later today.   Leighton Roach, PT  Acute Rehab Services  Pager [phone] Office [place] 10/05/2020, 9:56 AM

## 2020-10-05 NOTE — Transfer of Care (Signed)
Immediate Anesthesia Transfer of Care Note  Patient: Patrick Anthony  Procedure(s) Performed: TRANSESOPHAGEAL ECHOCARDIOGRAM (TEE) (N/A ) BUBBLE STUDY  Patient Location: Endoscopy Unit  Anesthesia Type:MAC  Level of Consciousness: drowsy  Airway & Oxygen Therapy: Patient Spontanous Breathing and Patient connected to nasal cannula oxygen  Post-op Assessment: Report given to RN and Post -op Vital signs reviewed and stable  Post vital signs: Reviewed and stable  Last Vitals:  Vitals Value Taken Time  BP 170/67 10/05/20 1018  Temp    Pulse 82 10/05/20 1018  Resp 35 10/05/20 1018  SpO2 99 % 10/05/20 1018  Vitals shown include unvalidated device data.  Last Pain:  Vitals:   10/05/20 1018  TempSrc:   PainSc: Asleep      Patients Stated Pain Goal: 1 (14/97/02 6378)  Complications: No complications documented.

## 2020-10-05 NOTE — Interval H&P Note (Signed)
History and Physical Interval Note:  10/05/2020 9:19 AM  Patrick Anthony  has presented today for surgery, with the diagnosis of BACTEREMIA.  The various methods of treatment have been discussed with the patient and family. After consideration of risks, benefits and other options for treatment, the patient has consented to  Procedure(s): TRANSESOPHAGEAL ECHOCARDIOGRAM (TEE) (N/A) as a surgical intervention.  The patient's history has been reviewed, patient examined, no change in status, stable for surgery.  I have reviewed the patient's chart and labs.  Questions were answered to the patient's satisfaction.     Pixie Casino

## 2020-10-05 NOTE — Progress Notes (Addendum)
STROKE TEAM PROGRESS NOTE   INTERVAL HISTORY No acute events.  Patient just returned back from TEE  He has no complaints.  Neurological exam is unchanged No family at bedside.  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  Mild to moderate mitral regurg.  No thrombus.  Vitals:   10/05/20 1018 10/05/20 1040 10/05/20 1042 10/05/20 1100  BP: (!) 170/67 (!) 180/88 (!) 180/88 (!) 162/86  Pulse: 84 78 81 70  Resp: (!) 35 (!) '22 20 20  '$ Temp: (!) 97.3 F (36.3 C)     TempSrc: Temporal     SpO2: 100% 100% 100% 100%  Weight:      Height:       CBC:  Recent Labs  Lab 10/04/20 0211 10/05/20 0431  WBC 26.0* 20.6*  NEUTROABS 23.9* 15.7*  HGB 8.7* 8.7*  HCT 25.3* 26.1*  MCV 80.3 82.6  PLT 217 [id number]   Basic Metabolic Panel:  Recent Labs  Lab 10/04/20 0211 10/05/20 0431  NA 132* 134*  K 4.2 4.0  CL 91* 94*  CO2 23 25  GLUCOSE 119* 115*  BUN 93* 53*  CREATININE 10.74* 7.85*  CALCIUM 8.0* 7.6*  MG 2.8* 2.5*  PHOS 7.0* 6.0*   Lipid Panel:  Recent Labs  Lab 09/29/20 0435  CHOL 126  TRIG 342*  HDL <10*  CHOLHDL NOT CALCULATED  VLDL 68*  LDLCALC NOT CALCULATED   HgbA1c:  Recent Labs  Lab 09/29/20 0435  HGBA1C 5.5   Urine Drug Screen: No results for input(s): LABOPIA, COCAINSCRNUR, LABBENZ, AMPHETMU, THCU, LABBARB in the last 168 hours.  Alcohol Level No results for input(s): ETH in the last 168 hours.  MRA Neck Result date 09/28/2020 The visualized common carotid, internal carotid and vertebral arteries patent within the neck without appreciable stenosis.  MRA head WO contrast: Result date 09/28/2020 No intracranial large vessel occlusion or proximal high-grade arterial stenosis.  MRI Brain WO contrast Result date 09/28/2020 1. 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 Multiple  vascular territories are involved and findings are highly suspicious for an embolic process. 2. 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 A few scattered supratentorial chronic microhemorrhages are also present. 3. Mild generalized atrophy of the brain.  CT Head WO contrast Result date 09/28/2020 IMPRESSION: 1. 10 mm infarct within the medial left cerebellar hemisphere, which may be acute. Consider brain MRI for further evaluation. 2. No acute intracranial hemorrhage or acute demarcated cortical infarct.  EEG adult Result Date: 09/28/2020 IMPRESSION: This study initially showed generalized periodic discharges with triphasic morphology at 2-2.'5hz'$ . This eeg pattern can be on the ictal-interictal continuum and also seen with toxic-metabolic etiology. EEG improved after IV ativan which although not confirmatory is concerning for ictal nature of this pattern. There is also moderate diffuse encephalopathy, non specific etiology. One episode of right arm tremor was noted at 1528 during which eeg showed generalized periodic discharges with triphasic morphology at 2-2.5 without definite evolution. However, focal motor seizures may not be seen on scalp eeg. Therefore, clinical correlation is recommended. Patrick Anthony   ECHOCARDIOGRAM COMPLETE BUBBLE STUDY Result Date: 09/28/2020    1. Left ventricular ejection fraction, by estimation, is 55 to 60%. The left ventricle has normal function. The left ventricle has no regional wall motion abnormalities. There is moderate concentric left ventricular hypertrophy. Left ventricular diastolic parameters are consistent  with Grade II diastolic dysfunction (pseudonormalization).  2. Right ventricular systolic function is normal. The right ventricular size is normal. There is normal pulmonary artery systolic pressure.  3. Left atrial size was moderately dilated.   4. Right atrial size was mild to moderately  dilated.   5. The mitral valve is normal in structure. Mild to moderate mitral valve regurgitation. No evidence of mitral stenosis. Moderate to severe mitral annular calcification.   6. The aortic valve is tricuspid. Aortic valve regurgitation is trivial. No aortic stenosis is present.   7. The inferior vena cava is normal in size with greater than 50% respiratory variability, suggesting right atrial pressure of 3 mmHg.   8. Agitated saline contrast bubble study was negative, with no evidence of any interatrial shunt.   PHYSICAL EXAM  Temp:  [97.3 F (36.3 C)-99.3 F (37.4 C)] 97.3 F (36.3 C) (03/01 1018) Pulse Rate:  [70-88] 70 (03/01 1100) Resp:  [15-35] 20 (03/01 1100) BP: (115-180)/(63-90) 162/86 (03/01 1100) SpO2:  [99 %-100 %] 100 % (03/01 1100) Weight:  [79 kg] 79 kg (03/01 0911)  General -Pleasant middle-age African-American male.  Not in distress.  Ophthalmologic - fundi not visualized due to noncooperation.  Cardiovascular - Regular rhythm and no murmur Neuro -awake alert and interactive.  Orientted to self, place, hospital name, year and month. When I ask about his situation he replies he is here for "heart stuff". Paucity of speech, but able to speak short sentences, able to name 2/4, following simple commands. No gaze palsy, track bilaterally, blinking to visual threat bilaterally, PERRL. No facial droop. Tongue midline. Bilateral UEs proximal 3/5 and distal 3+/5. Bilaterally LEs 3/5 at least. Sensation subjectively symmetrical and coordination intact but slow.  gait not tested.   ASSESSMENT/PLAN Patrick Anthony is a 50 y.o. male with history of end-stage renal disease due to adult polycystic kidney disease on hemodialysis Monday, Wednesday, Friday with left upper extremity AV graft, hypertension, obstructive sleep apnea, chronic heart failure with preserved ejection fraction, hepatitis C with liver cirrhosis, and history of alcohol abuse who presented to the hospital  09/27/20 for evaluation of generalized weakness, lethargy, tachycardia, fever, and chills. He was found to be septic with a temperature of 103.2, a heart rate in the 140's, leukocytosis, and an elevated lactic acidosis with lactic acid of 3.5; presumably secondary to a left lower lobe pneumonia identified on x-ray.  On 09/28/2020 at [facility] he developed word finding difficulty and aphasia.  He was also noted to have tremor of the upper extremities, subsequently a code stroke was activated and he was taken to CT for imaging.  No TPA was given as he was outside of time window (LKN 0100).  He was not a thrombectomy candidate as no LVO seen on imaging.      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    MRI head: Scattered small acute infarcts within the bilateral cerebral hemispheres, right basal ganglia, callosal splenium and bilateral cerebellar hemispheres  MRA head and neck - negative  2D Echo EF 55-60%, Grade II diastolic dysfunction, moderate LVH  Recommend cardiothoracic surgery consult for consideration for mitral valve replacement given severe damage from endocarditis   triglycerides 342  HgbA1c 5.5  VTE prophylaxis - Heparin with dialysis  NPO: speech therapy is following  No antithrombotic prior to admission, now on  No antithrombotic due to the possibility of endocarditis  Therapy recommendations:  SNF  Disposition: pending  Tremor vs. Myoclonus  Asterixis   EEG: moderate diffuse encephalopathy, non specific etiology.  Multiple episodes of right arm tremor were noted without concomitant EEG change and are most likely not epileptic.   Likely due to encephalopathy    Ammonia 27  Significant asterixis, consistent with encephalopathy  Continue to treat underlying disease  ? MRSA  bacteremia - ID following  ? 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 BP goal normotensive  Hyperlipidemia  Home meds:  none  Direct LDL - 25.4, goal < 70  TG 342  No statin needed at this time given low LDL  Tobacco Abuse  Current smoker  Will need smoking cessation counseling once appropriate  Nicotine patch  Other Stroke Risk Factors  ETOH use  Coronary artery disease  Obstructive sleep apnea, unclear if he is on CPAP at home  Congestive heart failure diastolic CHF (0000000)  Other Active problems  Hepatitis C/ Cirrhosis of liver   Anemia of chronic disease - Hgb - 9.3->9.9->10.4->9.1->8.6  INR 1.4->1.3  Hospital day # 8 Plan : Cardiothoracic surgery consult for consideration for mitral valve replacement due to severe damage from endocarditis.  Long discussion patient with no agreement.  Discussed with Dr. Florene Glen.  Greater than 50% time during this 25-minute visit was spent on counseling and coordination of care and patient care team and answering questions. Stroke team will sign off.  Kindly call for questions. Antony Contras, MD  To contact Stroke Continuity provider, please refer  to [email or web address] After hours, contact General Neurology here tenaciously also

## 2020-10-05 NOTE — Progress Notes (Signed)
  Echocardiogram Echocardiogram Transesophageal has been performed.  Patrick Anthony 10/05/2020, 10:31 AM

## 2020-10-05 NOTE — Progress Notes (Signed)
Pharmacy Antibiotic Note  Patrick Anthony is a 50 y.o. male admitted on 09/27/2020 with fever, chills, and generalized weakness.  P found to have MRSA bacteremia with concerns for possible infective endocaarditis (TEE pending).  Patient continues on Vancomycin per pharmacy, abx d #8.  Pt has ESRD with HD MWF.    The patient's pre-HD level on 2/28 was elevated at 41 mcg/ml. The dose was held with HD on 2/28 - will give a reduced dose tomorrow with HD, then start a maintenance dose of 750 mg on MWF starting on 3/4.   Plan: - Vancomycin 500 mg IV x 1 dose after HD on Wed, 3/2 - Start 750 mg/HD-MWF starting on Fri, 3/4 - Will continue to follow HD schedule/duration, culture results, LOT, and antibiotic de-escalation plans   Height: 6' (182.9 cm) Weight: 79 kg (174 lb 2.6 oz) IBW/kg (Calculated) : 77.6  Temp (24hrs), Avg:98.4 F (36.9 C), Min:97.3 F (36.3 C), Max:99.3 F (37.4 C)  Recent Labs  Lab 10/01/20 0818 10/02/20 0315 10/03/20 0242 10/04/20 0211 10/05/20 0431  WBC 38.5* 32.2* 31.1* 26.0* 20.6*  CREATININE 11.05* 7.00* 8.93* 10.74* 7.85*  VANCORANDOM  --   --   --  41  --     Estimated Creatinine Clearance: 12.5 mL/min (A) (by C-G formula based on SCr of 7.85 mg/dL (H)).    No Known Allergies  Antimicrobials this admission: Vanc 2/21>> Cefepime 2/21>>2/22 Flagyl 2/21>>2/22 (2 doses given)  Dose adjustments this admission: 2/28 Pre-HD VR 41 - estimate post HD level 27 Dose held 2/28 and reduced to '750mg'$  IV qHD starting 3/2  Microbiology results: 2/21 blood x 2: MRSA, sens Vanc MIC < 0.5, Septra, TCN, gent MIC < 0.5, Clinda 2/22 BCID: MRSA  2/21 COVID and flu: negative 2/21 HIV: non-reactive 2/24 blood x 2: 1/2 SA 2/27 blood x 2: pending  Thank you for allowing pharmacy to be a part of this patient's care.  Alycia Rossetti, PharmD, BCPS Clinical Pharmacist Clinical phone for 10/05/2020: [phone] 10/05/2020 2:03 PM   **Pharmacist phone directory can now be found on  [email or web address] ([email or web address]).  Listed under [place].

## 2020-10-05 NOTE — Anesthesia Preprocedure Evaluation (Addendum)
Anesthesia Evaluation  Patient identified by MRN, date of birth, ID band Patient awake    Reviewed: Allergy & Precautions, NPO status , Patient's Chart, lab work & pertinent test results, reviewed documented beta blocker date and time   History of Anesthesia Complications Negative for: history of anesthetic complications  Airway Mallampati: III  TM Distance: >3 FB Neck ROM: Full  Mouth opening: Limited Mouth Opening  Dental  (+) Poor Dentition   Pulmonary sleep apnea , former smoker,    Pulmonary exam normal        Cardiovascular hypertension, Pt. on medications and Pt. on home beta blockers +CHF  Normal cardiovascular exam  ECHO 09/2020: EF 55-60%, moderate LVH, grade II diastolic dysfunction, moderate LAE, mild-mod RAE, mild-mod MR     Neuro/Psych CVA negative psych ROS   GI/Hepatic negative GI ROS, (+) Cirrhosis     substance abuse  alcohol use, Hepatitis -, C  Endo/Other  negative endocrine ROS  Renal/GU ESRF and CRFRenal disease (HD T/Th/Sa)  negative genitourinary   Musculoskeletal negative musculoskeletal ROS (+)   Abdominal   Peds  Hematology negative hematology ROS (+)   Anesthesia Other Findings Day of surgery medications reviewed with patient.  Reproductive/Obstetrics negative OB ROS                            Anesthesia Physical Anesthesia Plan  ASA: III  Anesthesia Plan: MAC   Post-op Pain Management:    Induction:   PONV Risk Score and Plan: Treatment may vary due to age or medical condition and Propofol infusion  Airway Management Planned: Natural Airway and Nasal Cannula  Additional Equipment:   Intra-op Plan:   Post-operative Plan:   Informed Consent: I have reviewed the patients History and Physical, chart, labs and discussed the procedure including the risks, benefits and alternatives for the proposed anesthesia with the patient or authorized  representative who has indicated his/her understanding and acceptance.       Plan Discussed with: CRNA  Anesthesia Plan Comments:        Anesthesia Quick Evaluation

## 2020-10-06 ENCOUNTER — Encounter (HOSPITAL_COMMUNITY): Payer: Self-pay | Admitting: Internal Medicine

## 2020-10-06 ENCOUNTER — Inpatient Hospital Stay (HOSPITAL_COMMUNITY): Payer: Medicaid Other

## 2020-10-06 DIAGNOSIS — I330 Acute and subacute infective endocarditis: Secondary | ICD-10-CM

## 2020-10-06 DIAGNOSIS — I132 Hypertensive heart and chronic kidney disease with heart failure and with stage 5 chronic kidney disease, or end stage renal disease: Secondary | ICD-10-CM | POA: Diagnosis not present

## 2020-10-06 DIAGNOSIS — R918 Other nonspecific abnormal finding of lung field: Secondary | ICD-10-CM | POA: Diagnosis not present

## 2020-10-06 DIAGNOSIS — Q2112 Patent foramen ovale: Secondary | ICD-10-CM

## 2020-10-06 DIAGNOSIS — M545 Low back pain, unspecified: Secondary | ICD-10-CM | POA: Diagnosis not present

## 2020-10-06 DIAGNOSIS — Q211 Atrial septal defect: Secondary | ICD-10-CM

## 2020-10-06 DIAGNOSIS — J189 Pneumonia, unspecified organism: Secondary | ICD-10-CM | POA: Diagnosis not present

## 2020-10-06 DIAGNOSIS — R7881 Bacteremia: Secondary | ICD-10-CM | POA: Diagnosis not present

## 2020-10-06 DIAGNOSIS — I639 Cerebral infarction, unspecified: Secondary | ICD-10-CM | POA: Diagnosis not present

## 2020-10-06 DIAGNOSIS — I10 Essential (primary) hypertension: Secondary | ICD-10-CM | POA: Diagnosis present

## 2020-10-06 DIAGNOSIS — I5032 Chronic diastolic (congestive) heart failure: Secondary | ICD-10-CM | POA: Diagnosis not present

## 2020-10-06 DIAGNOSIS — B9561 Methicillin susceptible Staphylococcus aureus infection as the cause of diseases classified elsewhere: Secondary | ICD-10-CM | POA: Diagnosis not present

## 2020-10-06 DIAGNOSIS — Q612 Polycystic kidney, adult type: Secondary | ICD-10-CM | POA: Diagnosis not present

## 2020-10-06 DIAGNOSIS — Z992 Dependence on renal dialysis: Secondary | ICD-10-CM | POA: Diagnosis not present

## 2020-10-06 DIAGNOSIS — R531 Weakness: Secondary | ICD-10-CM | POA: Diagnosis not present

## 2020-10-06 DIAGNOSIS — N186 End stage renal disease: Secondary | ICD-10-CM | POA: Diagnosis not present

## 2020-10-06 DIAGNOSIS — D631 Anemia in chronic kidney disease: Secondary | ICD-10-CM | POA: Diagnosis not present

## 2020-10-06 DIAGNOSIS — R0602 Shortness of breath: Secondary | ICD-10-CM | POA: Diagnosis not present

## 2020-10-06 DIAGNOSIS — A419 Sepsis, unspecified organism: Secondary | ICD-10-CM | POA: Diagnosis not present

## 2020-10-06 DIAGNOSIS — I340 Nonrheumatic mitral (valve) insufficiency: Secondary | ICD-10-CM | POA: Diagnosis present

## 2020-10-06 LAB — RENAL FUNCTION PANEL
Albumin: 2.1 g/dL — ABNORMAL LOW (ref 3.5–5.0)
Anion gap: 18 — ABNORMAL HIGH (ref 5–15)
BUN: 77 mg/dL — ABNORMAL HIGH (ref 6–20)
CO2: 22 mmol/L (ref 22–32)
Calcium: 7.3 mg/dL — ABNORMAL LOW (ref 8.9–10.3)
Chloride: 93 mmol/L — ABNORMAL LOW (ref 98–111)
Creatinine, Ser: 10.6 mg/dL — ABNORMAL HIGH (ref 0.61–1.24)
GFR, Estimated: 5 mL/min — ABNORMAL LOW (ref >60)
Glucose, Bld: 109 mg/dL — ABNORMAL HIGH (ref 70–99)
Phosphorus: 8.3 mg/dL — ABNORMAL HIGH (ref 2.5–4.6)
Potassium: 3.9 mmol/L (ref 3.5–5.1)
Sodium: 133 mmol/L — ABNORMAL LOW (ref 135–145)

## 2020-10-06 LAB — CBC
HCT: 24.5 % — ABNORMAL LOW (ref 39.0–52.0)
Hemoglobin: 8.1 g/dL — ABNORMAL LOW (ref 13.0–17.0)
MCH: 27.6 pg (ref 26.0–34.0)
MCHC: 33.1 g/dL (ref 30.0–36.0)
MCV: 83.3 fL (ref 80.0–100.0)
Platelets: 221 10*3/uL (ref 150–400)
RBC: 2.94 MIL/uL — ABNORMAL LOW (ref 4.22–5.81)
RDW: 17.3 % — ABNORMAL HIGH (ref 11.5–15.5)
WBC: 18.7 10*3/uL — ABNORMAL HIGH (ref 4.0–10.5)
nRBC: 0 % (ref 0.0–0.2)

## 2020-10-06 IMAGING — DX DG CHEST 2V
2 series · 2 of 2 positions shown · non-contrast
Comparison: [DATE]

CLINICAL DATA: Left upper lobe lung mass

EXAM:
CHEST - 2 VIEW

[x chest ap]
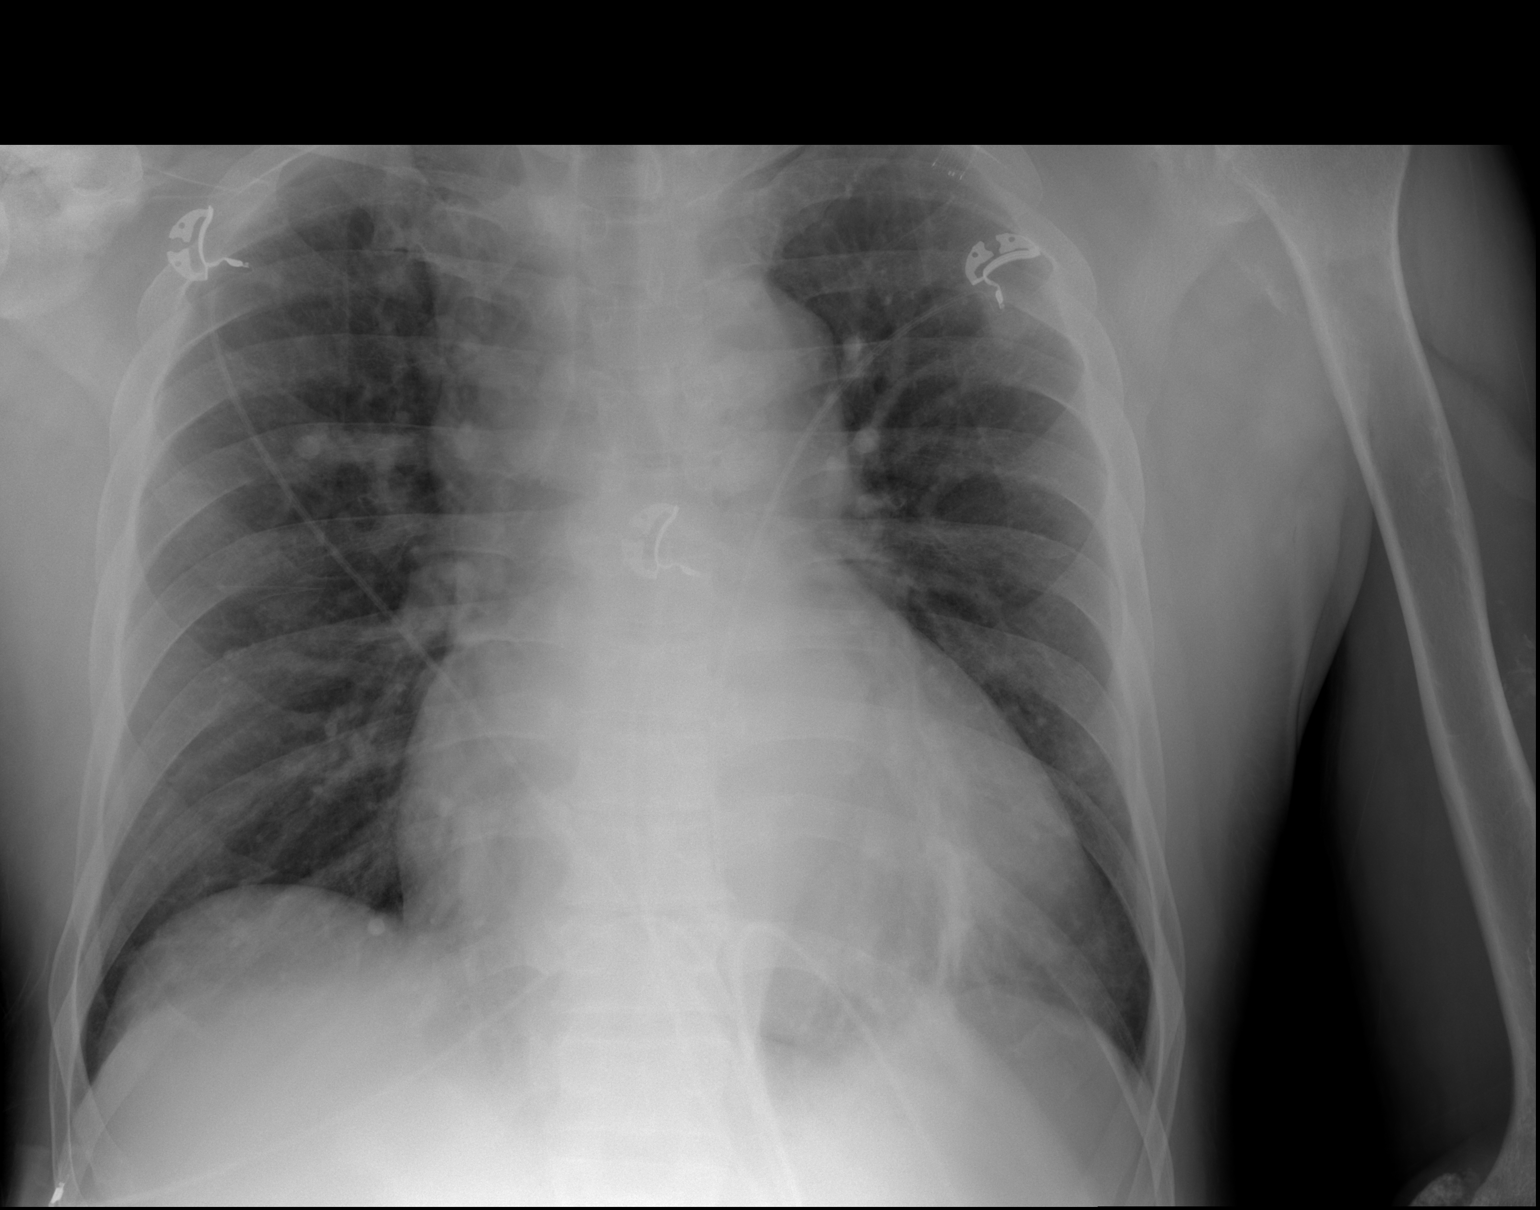

[w chest lat]
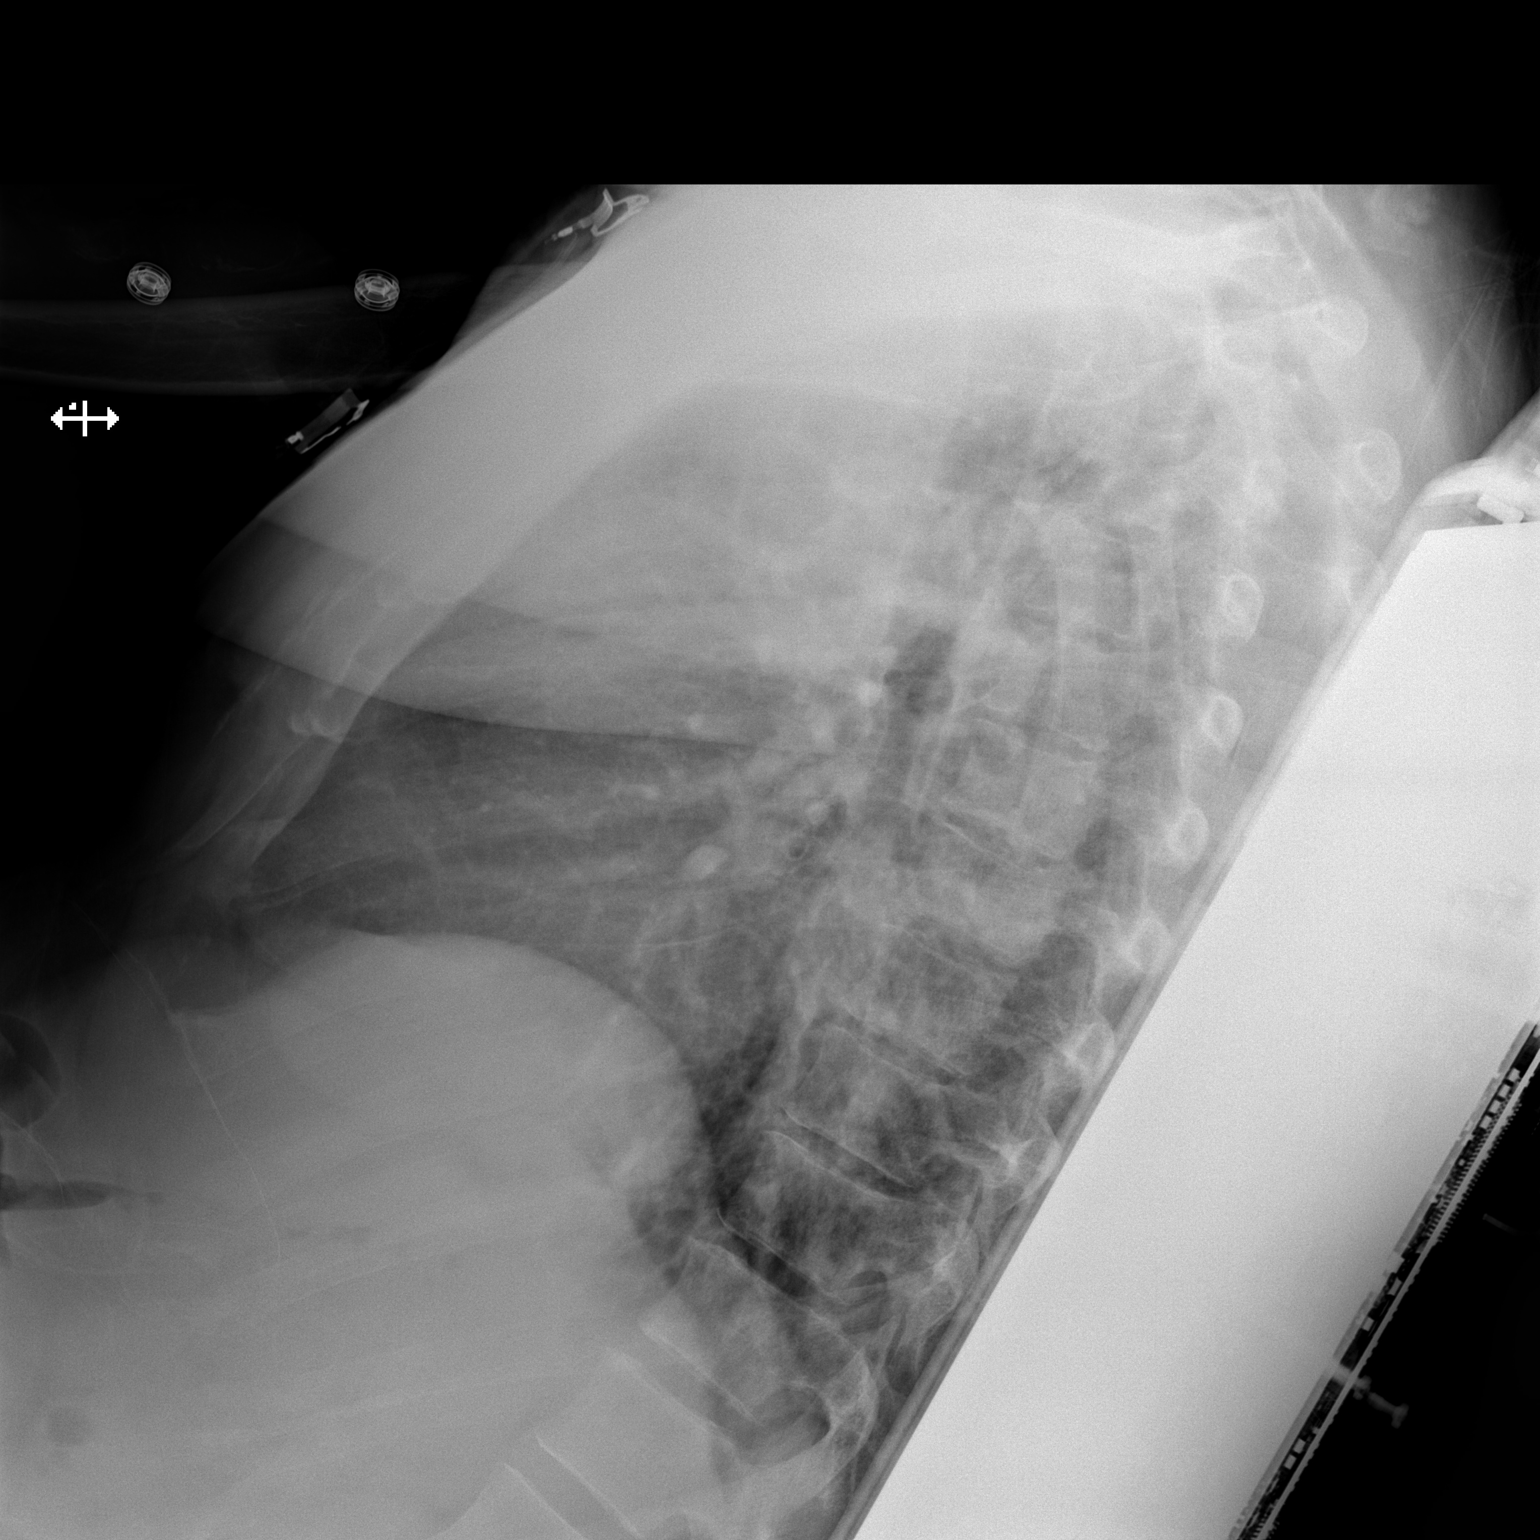

[2 of 2 positions shown; findings below may reference images not displayed]

FINDINGS: Cardiac shadow is enlarged. The lungs are well aerated bilaterally.
Known cavitary left upper lobe mass is obscured by overlying EKG
lead. Vascular stent is noted on the left. Calcified soft tissue
mass is noted about the right shoulder stable from prior CT.
IMPRESSION: Left upper lobe mass lesion is not well appreciated on this exam.

Chronic changes as described above.

## 2020-10-06 IMAGING — DX DG ORTHOPANTOGRAM /PANORAMIC
1 series · 1 of 1 positions shown · non-contrast
Comparison: None.

CLINICAL DATA: Preop evaluation

EXAM:
ORTHOPANTOGRAM/PANORAMIC

[view not recorded]
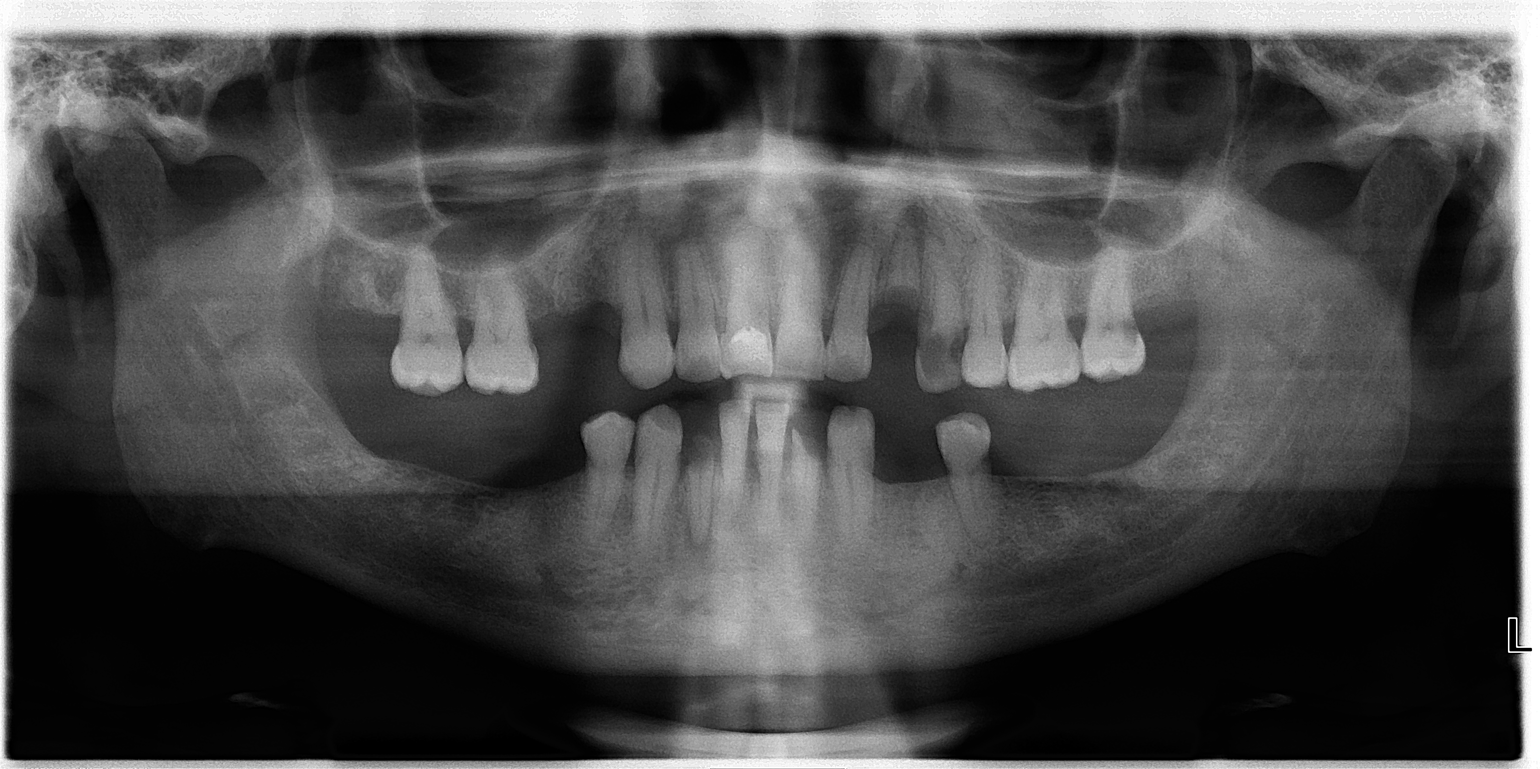

[1 of 1 positions shown; findings below may reference images not displayed]

FINDINGS: Panoramic view of the mandible reveals no acute fracture. Lucency is
seen surrounding the root of the left maxillary canine tooth with
extensive erosive changes consistent with dental caries. First
maxillary bicuspid on the left also demonstrates extensive dental
caries. Erosive changes of the lateral incisors in the mandible are
noted as well consistent with dental caries.
IMPRESSION: Multifocal dental caries.

Periapical lucency involving the left maxillary canine tooth.

## 2020-10-06 MED ORDER — DOXERCALCIFEROL 4 MCG/2ML IV SOLN
INTRAVENOUS | Status: AC
  Filled 2020-10-06: qty 2

## 2020-10-06 MED ORDER — VANCOMYCIN HCL IN DEXTROSE 500-5 MG/100ML-% IV SOLN
INTRAVENOUS | Status: AC
  Filled 2020-10-06: qty 100

## 2020-10-06 MED ORDER — DARBEPOETIN ALFA 100 MCG/0.5ML IJ SOSY
PREFILLED_SYRINGE | INTRAMUSCULAR | Status: AC
  Filled 2020-10-06: qty 0.5

## 2020-10-06 NOTE — TOC Initial Note (Signed)
Transition of Care [facility]) - Initial/Assessment Note    Patient Details  Name: Patrick Anthony MRN: [id number] Date of Birth: 1971/03/24  Transition of Care [facility]) CM/SW Contact:    Patrick Friar, RN Phone Number: 10/06/2020, 3:21 PM  Clinical Narrative:                 CM met with the patient and he states he lives at home alone. He states he drives himself to HD some days and a lady that lives downstairs helps him other days.  CM inquired about family and he says that all his family lives out of state. CM inquired about speaking to a family member and he said CM could call Patrick Anthony.  Patrick Anthony verifies all his family is out of state and there isnt anyone to assist him at home. She is interested in him going to SNF rehab until the family can figure out a long term plan. CM informed her that the patient may be reluctant to go to SNF rehab and she says the family will assist with convincing him he needs to go.  Patrick Anthony is interested in a facility in [place] area. CM will start the process.  Waiting to see about if patient is also going to need surgery prior to rehab.  TOC following.   Expected Discharge Plan: Skilled Nursing Facility Barriers to Discharge: Continued Medical Work up   Patient Goals and CMS Choice   CMS Medicare.gov Compare Post Acute Care list provided to:: Patient Choice offered to / list presented to : [place]  Expected Discharge Plan and Services Expected Discharge Plan: [place] In-house Referral: Clinical Social Work Discharge Planning Services: CM Consult Post Acute Care Choice: [place] Living arrangements for the past 2 months: Apartment                                      Prior Living Arrangements/Services Living arrangements for the past 2 months: Apartment Lives with:: Self Patient language and need for interpreter reviewed:: Yes Do you feel safe going back to the place where you live?: Yes       Need for Family Participation in Patient Care: Yes (Comment) Care giver support system in place?: No (comment)   Criminal Activity/Legal Involvement Pertinent to Current Situation/Hospitalization: No - Comment as needed  Activities of Daily Living Home Assistive Devices/Equipment: None ADL Screening (condition at time of admission) Patient's cognitive ability adequate to safely complete daily activities?: Yes Is the patient deaf or have difficulty hearing?: No Does the patient have difficulty seeing, even when wearing glasses/contacts?: No Does the patient have difficulty concentrating, remembering, or making decisions?: No Patient able to express need for assistance with ADLs?: Yes Does the patient have difficulty dressing or bathing?: No Independently performs ADLs?: Yes (appropriate for developmental age) Does the patient have difficulty walking or climbing stairs?: Yes Weakness of Legs: Both Weakness of Arms/Hands: None  Permission Sought/Granted                  Emotional Assessment Appearance:: Appears stated age Attitude/Demeanor/Rapport: Engaged Affect (typically observed): In denial Orientation: : Oriented to Self,Oriented to Place,Oriented to  Time,Oriented to Situation   Psych Involvement: No (comment)  Admission diagnosis:  Shortness of breath [R06.02] Sepsis due to pneumonia ([place]) [J18.9, A41.9] Community acquired pneumonia of left lower lobe of lung [J18.9] Sepsis, due to unspecified organism, unspecified whether acute  organ dysfunction present [facility]) [A41.9] Patient Active Problem List   Diagnosis Date Noted  . Mitral regurgitation   . Uncontrolled hypertension   . Patent foramen ovale   . Cerebral embolism with cerebral infarction 09/29/2020  . 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 Bacteremia due to Staphylococcus aureus   . Sepsis ([place]) 09/27/2020  . Pneumonia due to infectious agent 09/27/2020  . ADPKD (autosomal dominant  polycystic kidney disease) 09/27/2020  . Sepsis due to pneumonia ([place]) 09/27/2020  . Lactic acidosis 09/27/2020  . Bacterial endocarditis 09/27/2020  . Chest pain 05/03/2017  . End stage renal disease ([place]) 10/17/2013  . Anemia in chronic kidney disease 08/30/2013  . Hepatitis C 08/30/2013  . ESRD on dialysis ([place]) 08/30/2013  . Hypertensive crisis 08/28/2013  . Hypertensive emergency 04/20/2013  . Chronic combined systolic and diastolic congestive heart failure ([place]) 04/19/2013  . HTN (hypertension) 04/19/2013   PCP:  [name], MD Pharmacy:   [facility] [address] - [place], [place] [address] Phone: [phone] Fax: [phone]  [place], [place] - [address] [place] [address] Phone: [phone] Fax: [phone]  [facility] [phone] - [place], [place] - [address] [place] [address] Phone: [phone] Fax: [place], [place] [address] [place] [address] Phone: [phone] Fax: [phone]     Social Determinants of Health (SDOH) Interventions    Readmission Risk Interventions No flowsheet data found.

## 2020-10-06 NOTE — Progress Notes (Signed)
OT Cancellation Note  Patient Details Name: Patrick Anthony MRN: [id number] DOB: July 15, 1971   Cancelled Treatment:    Reason Eval/Treat Not Completed: Patient declined, no reason specified despite max coaxing/encouragement. OT will continue efforts.   Gloris Manchester OTR/L Supplemental OT, Department of rehab services [phone]  Caleel Kiner R H. 10/06/2020, 2:46 PM
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
## 2020-10-06 NOTE — Progress Notes (Signed)
PROGRESS NOTE  Patrick Anthony [id number] DOB: Dec 29, 1970 DOA: 09/27/2020 PCP: [name], MD   LOS: 9 days   Brief Narrative / Interim history: 50 year old male with medical history of end-stage renal disease due to APKD on hemodialysis MWF through left upper extremity AV graft, hypertension, OSA, chronic diastolic CHF, EF [id number], liver cirrhosis due to hepatitis C, history of alcohol abuse came to hospital with generalized weakness.  He was admitted with sepsis due to suspected pneumonia and started on antibiotics.  Code stroke was called on hospital day 1 and imaging showed embolic strokes.  His blood cultures were positive for staph aureus and he's had TEE on 3/1 which is concerning for mitral valve endocarditis.  Currently on vancomycin.  ID, neurology, cardiology, and CT surgery are follow.      Subjective / 24h Interval events: Doing well this morning, no complaints, denies any chest pain, no shortness of breath.  No abdominal pain, no nausea or vomiting.  Assessment & Plan: Principal Problem Mitral valve endocarditis, MRSA bacteremia-ID is following, patient has been started on vancomycin.  Surveillance blood cultures obtained on 2/27 after initiation of antibiotics remain negative.  CT scan of the abdomen and pelvis on 2/23 with cavitary mass in the left upper lobe, possibly septic embolus, as well as small foci of consolidation and groundglass density in the right upper lobe and small groundglass nodule in the left upper lobe (infectious versus inflammatory), CT also with polycystic kidney disease.  -Cardiothoracic surgery consulting today, he is a surgical candidate, will get a cardiac cath tomorrow in preparation for potential valve replacement surgery  Active Problems New onset aphasia, scattered small acute infarcts in the bilateral cerebral hemispheres, right basal ganglia, callosal splenium, bilateral cerebral hemispheres concerning for embolic process -likely due to  endocarditis.  2D echo showed EF 55-60%, grade 2 DD.  An MRI of the head and neck without appreciable stenosis.  Acute metabolic encephalopathy-improving  Seizure-like activity upper extremities-myoclonus suspected, hold gabapentin.  EEG negative  ESRD due to adult polycystic kidney disease-continue dialysis per nephrology  Iron deficiency anemia, anemia of chronic disease-monitor hemoglobin  Essential hypertension-continue regimen as below  Liver cirrhosis, history of hep C-monitor  Scheduled Meds: . amLODipine  10 mg Oral Daily  . calcium acetate  667 mg Oral TID WC  . calcium acetate  667 mg Oral With snacks  . Chlorhexidine Gluconate Cloth  6 each Topical Q0600  . cinacalcet  30 mg Oral Q breakfast  . cloNIDine  0.1 mg Transdermal Weekly  . darbepoetin (ARANESP) injection - DIALYSIS  100 mcg Intravenous Q Wed-HD  . doxercalciferol  3 mcg Intravenous Q M,W,F-HD  . feeding supplement (NEPRO CARB STEADY)  237 mL Oral BID BM  . hydrALAZINE  50 mg Oral BID  . metoprolol tartrate  75 mg Oral BID  . multivitamin  1 tablet Oral QHS  . vancomycin  500 mg Intravenous Q Wed-HD   Continuous Infusions: . [START ON 10/08/2020] vancomycin     PRN Meds:.acetaminophen **OR** acetaminophen (TYLENOL) oral liquid 160 mg/5 mL **OR** acetaminophen, albuterol, HYDROmorphone (DILAUDID) injection, ondansetron **OR** ondansetron (ZOFRAN) IV, oxyCODONE, polyethylene glycol  Diet Orders (From admission, onward)    Start     Ordered   10/07/20 0001  Diet NPO time specified  Diet effective midnight        10/06/20 1112   10/05/20 1043  Diet renal with fluid restriction Fluid restriction: 1200 mL Fluid; Room service appropriate? Yes; Fluid consistency: Thin  Diet effective  now       Question Answer Comment  Fluid restriction: 1200 mL Fluid   Room service appropriate? Yes   Fluid consistency: Thin      10/05/20 1043          DVT prophylaxis: SCD's Start: 09/28/20 1041     Code Status: Full  Code  Family Communication: no family at bedside  Status is: Inpatient  Remains inpatient appropriate because:Inpatient level of care appropriate due to severity of illness   Dispo: The patient is from: Home              Anticipated d/c is to: SNF              Patient currently is not medically stable to d/c.   Difficult to place patient No    Level of care: Telemetry Medical  Consultants:  Neurology ID Cardiology Cardiothoracic surgery  Procedures:  2D echo, TEE  Microbiology  Blood cultures MRSA Surveillance 2/27 - negative  Antimicrobials: Vancomycin     Objective: Vitals:   10/05/20 1938 10/05/20 2320 10/06/20 0313 10/06/20 0833  BP: 124/88 (!) 125/91 109/81 (!) 137/92  Pulse: 74 84 79 81  Resp: '19 18 15 16  '$ Temp: 98 F (36.7 C) 98.2 F (36.8 C) 98.6 F (37 C) 97.6 F (36.4 C)  TempSrc: Oral Oral  Oral  SpO2: 100% 99% 94% 99%  Weight:      Height:        Intake/Output Summary (Last 24 hours) at 10/06/2020 1139 Last data filed at 10/05/2020 2005 Gross per 24 hour  Intake 360 ml  Output -  Net 360 ml   Filed Weights   10/04/20 0740 10/04/20 1055 10/05/20 0911  Weight: 81 kg 79 kg 79 kg    Examination:  Constitutional: NAD Eyes: no scleral icterus ENMT: Mucous membranes are moist.  Neck: normal, supple Respiratory: clear to auscultation bilaterally, no wheezing, no crackles. Normal respiratory effort. No accessory muscle use.  Cardiovascular: Regular rate and rhythm, no murmurs / rubs / gallops. No LE edema. Abdomen: non distended, no tenderness. Bowel sounds positive.  Musculoskeletal: no clubbing / cyanosis.  Skin: no rashes Neurologic: CN 2-12 grossly intact. Strength 5/5 in all 4.   Data Reviewed: I have independently reviewed following labs and imaging studies   CBC: Recent Labs  Lab 10/01/20 0818 10/02/20 0315 10/03/20 0242 10/04/20 0211 10/05/20 0431  WBC 38.5* 32.2* 31.1* 26.0* 20.6*  NEUTROABS 31.6* 29.3* 28.0* 23.9* 15.7*   HGB 9.1* 9.5* 8.6* 8.7* 8.7*  HCT 27.5* 27.6* 26.5* 25.3* 26.1*  MCV 80.6 80.9 81.8 80.3 82.6  PLT 191 185 294 217 [id number]   Basic Metabolic Panel: Recent Labs  Lab 10/01/20 0818 10/02/20 0315 10/03/20 0242 10/04/20 0211 10/05/20 0431  NA 138 135 133* 132* 134*  K 4.6 4.1 3.9 4.2 4.0  CL 96* 96* 91* 91* 94*  CO2 21* '23 23 23 25  '$ GLUCOSE 94 115* 123* 119* 115*  BUN 103* 54* 71* 93* 53*  CREATININE 11.05* 7.00* 8.93* 10.74* 7.85*  CALCIUM 8.5* 8.6* 8.2* 8.0* 7.6*  MG 3.1* 2.5* 2.6* 2.8* 2.5*  PHOS 7.5* 5.3* 6.0* 7.0* 6.0*   Liver Function Tests: Recent Labs  Lab 10/01/20 0818 10/02/20 0315 10/03/20 0242 10/04/20 0211 10/05/20 0431  AST '27 25 19 22 25  '$ ALT '14 13 11 12 13  '$ ALKPHOS 80 78 75 75 70  BILITOT 1.6* 1.7* 1.1 1.2 1.1  PROT 7.7 7.8 7.3 7.4 7.6  ALBUMIN 2.4*  2.3* 2.2* 2.2* 2.2*   Coagulation Profile: Recent Labs  Lab 10/01/20 0818 10/02/20 0315 10/04/20 0211  INR 1.4* 1.3* 1.1   HbA1C: No results for input(s): HGBA1C in the last 72 hours. CBG: Recent Labs  Lab 10/02/20 1124  GLUCAP 122*    Recent Results (from the past 240 hour(s))  Blood Culture (routine x 2)     Status: Abnormal   Collection Time: 09/27/20  2:16 PM   Specimen: BLOOD RIGHT HAND  Result Value Ref Range Status   Specimen Description BLOOD RIGHT HAND  Final   Special Requests   Final    BOTTLES DRAWN AEROBIC AND ANAEROBIC Blood Culture results may not be optimal due to an inadequate volume of blood received in culture bottles Performed at [facility] [address], [place], [place] [address]    Culture  Setup Time   Final    GRAM POSITIVE COCCI IN CLUSTERS IN BOTH AEROBIC AND ANAEROBIC BOTTLES    Culture METHICILLIN RESISTANT STAPHYLOCOCCUS AUREUS (A)  Final   Report Status 09/30/2020 FINAL  Final   Organism ID, Bacteria METHICILLIN RESISTANT STAPHYLOCOCCUS AUREUS  Final      Susceptibility   Methicillin resistant staphylococcus aureus - MIC*    CIPROFLOXACIN >=8  RESISTANT Resistant     ERYTHROMYCIN >=8 RESISTANT Resistant     GENTAMICIN <=0.5 SENSITIVE Sensitive     OXACILLIN >=4 RESISTANT Resistant     TETRACYCLINE <=1 SENSITIVE Sensitive     VANCOMYCIN <=0.5 SENSITIVE Sensitive     TRIMETH/SULFA <=10 SENSITIVE Sensitive     CLINDAMYCIN <=0.25 SENSITIVE Sensitive     RIFAMPIN <=0.5 SENSITIVE Sensitive     Inducible Clindamycin NEGATIVE Sensitive     * METHICILLIN RESISTANT STAPHYLOCOCCUS AUREUS  Resp Panel by RT-PCR (Flu A&B, Covid) Nasopharyngeal Swab     Status: None   Collection Time: 09/27/20  2:16 PM   Specimen: Nasopharyngeal Swab; Nasopharyngeal(NP) swabs in vial transport medium  Result Value Ref Range Status   SARS Coronavirus 2 by RT PCR NEGATIVE NEGATIVE Final    Comment: (NOTE) SARS-CoV-2 target nucleic acids are NOT DETECTED.  The SARS-CoV-2 RNA is generally detectable in upper respiratory specimens during the acute phase of infection. The lowest concentration of SARS-CoV-2 viral copies this assay can detect is 138 copies/mL. A negative result does not preclude SARS-Cov-2 infection and should not be used as the sole basis for treatment or other patient management decisions. A negative result may occur with  improper specimen collection/handling, submission of specimen other than nasopharyngeal swab, presence of viral mutation(s) within the areas targeted by this assay, and inadequate number of viral copies(<138 copies/mL). A negative result must be combined with clinical observations, patient history, and epidemiological information. The expected result is Negative.  Fact Sheet for Patients:  EntrepreneurPulse.com.au  Fact Sheet for Healthcare Providers:  IncredibleEmployment.be  This test is no t yet approved or cleared by the [place] FDA and  has been authorized for detection and/or diagnosis of SARS-CoV-2 by FDA under an Emergency Use Authorization (EUA). This EUA will remain   in effect (meaning this test can be used) for the duration of the COVID-19 declaration under Section 564(b)(1) of the Act, 21 U.S.C.section 360bbb-3(b)(1), unless the authorization is terminated  or revoked sooner.       Influenza A by PCR NEGATIVE NEGATIVE Final   Influenza B by PCR NEGATIVE NEGATIVE Final    Comment: (NOTE) The Xpert Xpress SARS-CoV-2/FLU/RSV plus assay is intended as an aid in  the diagnosis of influenza from Nasopharyngeal swab specimens and should not be used as a sole basis for treatment. Nasal washings and aspirates are unacceptable for Xpert Xpress SARS-CoV-2/FLU/RSV testing.  Fact Sheet for Patients: EntrepreneurPulse.com.au  Fact Sheet for Healthcare Providers: IncredibleEmployment.be  This test is not yet approved or cleared by the [place] FDA and has been authorized for detection and/or diagnosis of SARS-CoV-2 by FDA under an Emergency Use Authorization (EUA). This EUA will remain in effect (meaning this test can be used) for the duration of the COVID-19 declaration under Section 564(b)(1) of the Act, 21 U.S.C. section 360bbb-3(b)(1), unless the authorization is terminated or revoked.  Performed at [facility], [place] [address], [place], [place] [address]   Blood Culture ID Panel (Reflexed)     Status: Abnormal   Collection Time: 09/27/20  2:16 PM  Result Value Ref Range Status   Enterococcus faecalis NOT DETECTED NOT DETECTED Final   Enterococcus Faecium NOT DETECTED NOT DETECTED Final   Listeria monocytogenes NOT DETECTED NOT DETECTED Final   Staphylococcus species DETECTED (A) NOT DETECTED Final    Comment: CRITICAL RESULT CALLED TO, READ BACK BY AND VERIFIED WITH: C,PIERCE PHARMD '@1009'$  09/28/20 EB    Staphylococcus aureus (BCID) DETECTED (A) NOT DETECTED Final    Comment: Methicillin (oxacillin)-resistant Staphylococcus aureus (MRSA). MRSA is predictably resistant to beta-lactam antibiotics  (except ceftaroline). Preferred therapy is vancomycin unless clinically contraindicated. Patient requires contact precautions if  hospitalized. CRITICAL RESULT CALLED TO, READ BACK BY AND VERIFIED WITH: C,PIERCE PHARMD '@1009'$  09/28/20 EB    Staphylococcus epidermidis NOT DETECTED NOT DETECTED Final   Staphylococcus lugdunensis NOT DETECTED NOT DETECTED Final   Streptococcus species NOT DETECTED NOT DETECTED Final   Streptococcus agalactiae NOT DETECTED NOT DETECTED Final   Streptococcus pneumoniae NOT DETECTED NOT DETECTED Final   Streptococcus pyogenes NOT DETECTED NOT DETECTED Final   A.calcoaceticus-baumannii NOT DETECTED NOT DETECTED Final   Bacteroides fragilis NOT DETECTED NOT DETECTED Final   Enterobacterales NOT DETECTED NOT DETECTED Final   Enterobacter cloacae complex NOT DETECTED NOT DETECTED Final   Escherichia coli NOT DETECTED NOT DETECTED Final   Klebsiella aerogenes NOT DETECTED NOT DETECTED Final   Klebsiella oxytoca NOT DETECTED NOT DETECTED Final   Klebsiella pneumoniae NOT DETECTED NOT DETECTED Final   Proteus species NOT DETECTED NOT DETECTED Final   Salmonella species NOT DETECTED NOT DETECTED Final   Serratia marcescens NOT DETECTED NOT DETECTED Final   Haemophilus influenzae NOT DETECTED NOT DETECTED Final   Neisseria meningitidis NOT DETECTED NOT DETECTED Final   Pseudomonas aeruginosa NOT DETECTED NOT DETECTED Final   Stenotrophomonas maltophilia NOT DETECTED NOT DETECTED Final   Candida albicans NOT DETECTED NOT DETECTED Final   Candida auris NOT DETECTED NOT DETECTED Final   Candida glabrata NOT DETECTED NOT DETECTED Final   Candida krusei NOT DETECTED NOT DETECTED Final   Candida parapsilosis NOT DETECTED NOT DETECTED Final   Candida tropicalis NOT DETECTED NOT DETECTED Final   Cryptococcus neoformans/gattii NOT DETECTED NOT DETECTED Final   Meth resistant mecA/C and MREJ DETECTED (A) NOT DETECTED Final    Comment: CRITICAL RESULT CALLED TO, READ BACK BY  AND VERIFIED WITH: C,PIERCE PHARMD '@1009'$  09/28/20 EB Performed at [facility], [address] [place], [place] [address]   Blood Culture (routine x 2)     Status: Abnormal   Collection Time: 09/27/20  2:29 PM   Specimen: BLOOD  Result Value Ref Range Status   Specimen Description BLOOD SITE  NOT SPECIFIED  Final   Special Requests   Final    BOTTLES DRAWN AEROBIC AND ANAEROBIC Blood Culture results may not be optimal due to an inadequate volume of blood received in culture bottles   Culture  Setup Time   Final    GRAM POSITIVE COCCI IN CLUSTERS ANAEROBIC BOTTLE ONLY CRITICAL VALUE NOTED.  VALUE IS CONSISTENT WITH PREVIOUSLY REPORTED AND CALLED VALUE.    Culture (A)  Final    STAPHYLOCOCCUS AUREUS SUSCEPTIBILITIES PERFORMED ON PREVIOUS CULTURE WITHIN THE LAST 5 DAYS. Performed at [facility], [place] [address] [place] [address] [place], [place] [address]    Report Status 10/01/2020 FINAL  Final  Culture, blood (routine x 2)     Status: None   Collection Time: 09/30/20  6:04 AM   Specimen: BLOOD RIGHT FOREARM  Result Value Ref Range Status   Specimen Description BLOOD RIGHT FOREARM  Final   Special Requests   Final    BOTTLES DRAWN AEROBIC AND ANAEROBIC Blood Culture adequate volume   Culture   Final    NO GROWTH 5 DAYS Performed at [facility], [place] [address] [place], [place] [address]    Report Status 10/05/2020 FINAL  Final  Culture, blood (routine x 2)     Status: Abnormal   Collection Time: 09/30/20  6:08 AM   Specimen: BLOOD RIGHT HAND  Result Value Ref Range Status   Specimen Description BLOOD RIGHT HAND  Final   Special Requests   Final    BOTTLES DRAWN AEROBIC AND ANAEROBIC Blood Culture results may not be optimal due to an inadequate volume of blood received in culture bottles   Culture  Setup Time   Final    GRAM POSITIVE COCCI IN CLUSTERS ANAEROBIC BOTTLE ONLY CRITICAL VALUE NOTED.  VALUE IS CONSISTENT WITH PREVIOUSLY REPORTED AND CALLED VALUE.    Culture (A)   Final    STAPHYLOCOCCUS AUREUS SUSCEPTIBILITIES PERFORMED ON PREVIOUS CULTURE WITHIN THE LAST 5 DAYS. Performed at [facility], [place] [address] [place], [place] [address]    Report Status 10/03/2020 FINAL  Final  Culture, blood (routine x 2)     Status: None (Preliminary result)   Collection Time: 10/03/20  2:42 AM   Specimen: BLOOD  Result Value Ref Range Status   Specimen Description BLOOD LEFT ARM  Final   Special Requests   Final    BOTTLES DRAWN AEROBIC AND ANAEROBIC Blood Culture results may not be optimal due to an inadequate volume of blood received in culture bottles   Culture   Final    NO GROWTH 3 DAYS Performed at [facility], [place] [address] [place], [place] [address]    Report Status PENDING  Incomplete  Culture, blood (routine x 2)     Status: None (Preliminary result)   Collection Time: 10/03/20  2:42 AM   Specimen: BLOOD  Result Value Ref Range Status   Specimen Description BLOOD LEFT HAND  Final   Special Requests   Final    BOTTLES DRAWN AEROBIC ONLY Blood Culture results may not be optimal due to an inadequate volume of blood received in culture bottles   Culture   Final    NO GROWTH 3 DAYS Performed at [facility], [place] [address] [place], [place] [address]    Report Status PENDING  Incomplete     Radiology Studies: No results found.   Marzetta Board, MD, PhD [facility]  Between 7 am - 7 pm  I am available, please contact me via Amion or Securechat  Between 7 pm - 7 am I am not available, please contact night coverage MD/APP via Amion

## 2020-10-06 NOTE — Progress Notes (Signed)
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   Patient had TEE yesterday that demonstrated large burden of vegetations involving both mitral valve leaflets with perforation of posterior mitral valve leaflet. Recommended for CT Surgery evaluation for possible replacement/debridement.   This morning, patient reports feeling better overall. No acute concerns at this time.   Inpatient Medications    Scheduled Meds: . amLODipine  10 mg Oral Daily  . calcium acetate  667 mg Oral TID WC  . calcium acetate  667 mg Oral With snacks  . Chlorhexidine Gluconate Cloth  6 each Topical Q0600  . cinacalcet  30 mg Oral Q breakfast  . cloNIDine  0.1 mg Transdermal Weekly  . darbepoetin (ARANESP) injection - DIALYSIS  100 mcg Intravenous Q Wed-HD  . doxercalciferol  3 mcg Intravenous Q M,W,F-HD  . feeding supplement (NEPRO CARB STEADY)  237 mL Oral BID BM  . hydrALAZINE  50 mg Oral BID  . metoprolol tartrate  75 mg Oral BID  . multivitamin  1 tablet Oral QHS  . vancomycin  500 mg Intravenous Q Wed-HD   Continuous Infusions: . [START ON 10/08/2020] vancomycin     PRN Meds: acetaminophen **OR** acetaminophen (TYLENOL) oral liquid 160 mg/5 mL **OR** acetaminophen, albuterol, HYDROmorphone (DILAUDID) injection, ondansetron **OR** ondansetron (ZOFRAN) IV, oxyCODONE, polyethylene glycol   Vital Signs    Vitals:   10/05/20 1500 10/05/20 1938 10/05/20 2320 10/06/20 0313  BP: (!) 158/99 124/88 (!) 125/91 109/81  Pulse: 80 74 84 79  Resp: (!) '22 19 18 15  '$ Temp:  98 F (36.7 C) 98.2 F (36.8 C) 98.6 F (37 C)  TempSrc:  Oral Oral   SpO2: 100% 100% 99% 94%  Weight:       Height:        Intake/Output Summary (Last 24 hours) at 10/06/2020 0749 Last data filed at 10/05/2020 2005 Gross per 24 hour  Intake 560 ml  Output --  Net 560 ml   Filed Weights   10/04/20 0740 10/04/20 1055 10/05/20 0911  Weight: 81 kg 79 kg 79 kg    Telemetry    NSR with HR 60-80s - Personally Reviewed  ECG    Sinus tachycardia without ST segment changes, HR 127 - Personally Reviewed  Physical Exam   Constitutional: Appears well-developed and well-nourished. No distress.  HENT: Normocephalic and atraumatic, EOMI, conjunctiva normal, moist mucous membranes Cardiovascular: Normal rate, regular rhythm, S1 and S2 present, high-pitched, holosystolic murmur, rubs, gallops.  Distal pulses intact Respiratory: No respiratory distress, no accessory muscle use.  Effort is normal.  Lungs are clear to auscultation bilaterally. GI: Nondistended, soft, nontender to palpation, active bowel sounds Musculoskeletal: Normal bulk and tone.  No peripheral edema noted. Neurological: Is alert and oriented x4, 4/5 strength in bilateral lower extremities; 5/5 in bilateral upper extremities; sensation grossly intact.  Skin: Warm and dry.  No rash, erythema, lesions noted. Psychiatric: Normal mood and affect. Behavior is normal. Judgment and thought content normal.   Labs    Chemistry Recent Labs  Lab 10/03/20 0242 10/04/20 0211 10/05/20 0431  NA 133* 132* 134*  K 3.9 4.2 4.0  CL 91* 91* 94*  CO2  $'23 23 25  'D$ GLUCOSE 123* 119* 115*  BUN 71* 93* 53*  CREATININE 8.93* 10.74* 7.85*  CALCIUM 8.2* 8.0* 7.6*  PROT 7.3 7.4 7.6  ALBUMIN 2.2* 2.2* 2.2*  AST '19 22 25  '$ ALT '11 12 13  '$ ALKPHOS 75 75 70  BILITOT 1.1 1.2 1.1  GFRNONAA 7* 5* 8*  ANIONGAP 19* 18* 15     Hematology Recent Labs  Lab 10/03/20 0242 10/04/20 0211 10/05/20 0431  WBC 31.1* 26.0* 20.6*  RBC 3.24* 3.15* 3.16*  HGB 8.6* 8.7* 8.7*  HCT 26.5* 25.3* 26.1*  MCV 81.8 80.3 82.6  MCH 26.5 27.6 27.5  MCHC 32.5 34.4 33.3  RDW  16.1* 16.5* 17.2*  PLT 294 217 230    Cardiac EnzymesNo results for input(s): TROPONINI in the last 168 hours. No results for input(s): TROPIPOC in the last 168 hours.   BNPNo results for input(s): BNP, PROBNP in the last 168 hours.   DDimer No results for input(s): DDIMER in the last 168 hours.   Radiology    ECHO TEE  Result Date: 10/05/2020    TRANSESOPHOGEAL ECHO REPORT   Patient Name:   Patrick Anthony Date of Exam: 10/05/2020 Medical Rec #:  [id number]      Height:       72.0 in Accession #:    [id number]     Weight:       174.2 lb Date of Birth:  02/07/1971      BSA:          2.009 m Patient Age:    82 years       BP:           170/67 mmHg Patient Gender: M              HR:           84 bpm. Exam Location:  Inpatient Procedure: Transesophageal Echo, Cardiac Doppler and Color Doppler Indications:     Bacteremia  History:         Patient has prior history of Echocardiogram examinations, most                  recent 09/28/2020. CHF, Mitral Valve Disease and Endocarditis,                  Signs/Symptoms:Bacteremia; Risk Factors:Hypertension. ESRD,                  Hep. C, Sepsis.  Sonographer:     Dustin Flock Referring Phys:  [id number] Darreld Mclean Diagnosing Phys: Lyman Bishop MD PROCEDURE: The transesophogeal probe was passed without difficulty through the esophogus of the patient. Sedation performed by performing physician. The patient was monitored while under deep sedation. Anesthestetic sedation was provided intravenously by  Anesthesiology: 231.'83mg'$  of Propofol, '40mg'$  of Lidocaine. The patient developed no complications during the procedure. IMPRESSIONS  1. Left ventricular ejection fraction, by estimation, is 60 to 65%. The left ventricle has normal function. There is moderate left ventricular hypertrophy.  2. Right ventricular systolic function is normal. The right ventricular size is normal.  3. Left atrial size was moderately dilated. No left atrial/left atrial appendage thrombus was  detected.  4. Right atrial size was moderately dilated.  5. Large vegetation on the mitral valve.  6. The posterior mitral leaflet appears perforated. There is extensive vegetation of both mitral leaflets, visualized with 2D/3D methods. The mitral valve is abnormal. Mild to moderate mitral valve regurgitation.  7. The aortic valve is tricuspid.  Aortic valve regurgitation is not visualized.  8. There is mild (Grade II) protruding plaque involving the transverse and descending aorta.  9. Agitated saline contrast bubble study was negative, with no evidence of any interatrial shunt. Conclusion(s)/Recommendation(s): Findings are concerning for vegetation/infective endocarditis as detailed above. CT surgery consultation for mitral valve replacement/debridement is recommended. FINDINGS  Left Ventricle: Left ventricular ejection fraction, by estimation, is 60 to 65%. The left ventricle has normal function. The left ventricular internal cavity size was normal in size. There is moderate left ventricular hypertrophy. Right Ventricle: The right ventricular size is normal. No increase in right ventricular wall thickness. Right ventricular systolic function is normal. Left Atrium: Left atrial size was moderately dilated. No left atrial/left atrial appendage thrombus was detected. Right Atrium: Right atrial size was moderately dilated. Pericardium: There is no evidence of pericardial effusion. Mitral Valve: The posterior mitral leaflet appears perforated. There is extensive vegetation of both mitral leaflets, visualized with 2D/3D methods. The mitral valve is abnormal. There is severe thickening of the anterior and posterior mitral valve leaflet(s). A large vegetation is seen on the both mitral leaflet. The MV vegetation measures 10 mm x 15 mm. Mild to moderate mitral valve regurgitation. Tricuspid Valve: The tricuspid valve is grossly normal. Tricuspid valve regurgitation is trivial. Aortic Valve: The aortic valve is tricuspid.  Aortic valve regurgitation is not visualized. Pulmonic Valve: The pulmonic valve was normal in structure. Pulmonic valve regurgitation is not visualized. Aorta: The aortic root and ascending aorta are structurally normal, with no evidence of dilitation. There is mild (Grade II) protruding plaque involving the transverse and descending aorta. IAS/Shunts: No atrial level shunt detected by color flow Doppler. Agitated saline contrast was given intravenously to evaluate for intracardiac shunting. Agitated saline contrast bubble study was negative, with no evidence of any interatrial shunt. Lyman Bishop MD Electronically signed by Lyman Bishop MD Signature Date/Time: 10/05/2020/2:34:23 PM    Final     Cardiac Studies  Echo w/Bubble study 09/28/2020 >  IMPRESSIONS  1. Left ventricular ejection fraction, by estimation, is 55 to 60%. The  left ventricle has normal function. The left ventricle has no regional  wall motion abnormalities. There is moderate concentric left ventricular  hypertrophy. Left ventricular diastolic parameters are consistent with Grade II diastolic dysfunction  (pseudonormalization).  2. Right ventricular systolic function is normal. The right ventricular size is normal. There is normal pulmonary artery systolic pressure.  3. Left atrial size was moderately dilated.  4. Right atrial size was mild to moderately dilated.  5. The mitral valve is normal in structure. Mild to moderate mitral valve regurgitation. No evidence of mitral stenosis. Moderate to severe mitral annular calcification.  6. The aortic valve is tricuspid. Aortic valve regurgitation is trivial. No aortic stenosis is present.  7. The inferior vena cava is normal in size with greater than 50% respiratory variability, suggesting right atrial pressure of 3 mmHg.  8. Agitated saline contrast bubble study was negative, with no evidence  of any interatrial shunt.   TEE 10/05/2020>  IMPRESSIONS  1. Left ventricular  ejection fraction, by estimation, is 60 to 65%. The left ventricle has normal function. There is moderate left ventricular hypertrophy.  2. Right ventricular systolic function is normal. The right ventricular size is normal.  3. Left atrial size was moderately dilated. No left atrial/left atrial appendage thrombus was detected.  4. Right atrial size was moderately dilated.  5. Large vegetation on the mitral valve.  6. The posterior mitral leaflet appears perforated.  There is extensive vegetation of both mitral leaflets, visualized with 2D/3D methods. The mitral valve is abnormal. Mild to moderate mitral valve regurgitation.  7. The aortic valve is tricuspid. Aortic valve regurgitation is not visualized.  8. There is mild (Grade II) protruding plaque involving the transverse  and descending aorta.  9. Agitated saline contrast bubble study was negative, with no evidence  of any interatrial shunt.  Patient Profile     50 y.o. 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   Assessment & Plan    Mitral Valve Endocarditis MRSA Bacteremia  Presented with fever and AMS. Initially thought to be secondary to pneumonia. However blood cultures 2/21 with MRSA bacteremia for which he is on IV vancomycin. Repeat blood Cx 2/27 neg to date. Leukocytosis is improving. CT Chest/Abd/Pelv on 2/23 with LUL apex cavitary mass concerning for infection vs septic embolus, small foci of consolidation and ground glass density in RUL and small ground glass nodule in LUL (infectious vs inflammatory). TTE without suspected intracardiac lesions. However, TEE notable for large vegetation on mitral valve with perforated posterior mitral leaflet. New murmur on auscultation. Patient would benefit from surgical intervention.  - CT Surgery consulted, appreciate  recommendations - Continue IV vancomycin for MRSA bacteremia per ID recommendations  - Continue to f/u Blood cultures  Diffuse Embolic CVAs Noted to have scattered small acute infarcts in bilateral cerebral hemispheres, right basal ganglia, callosal splenium and bilateral cerebellar hemispheres. Given the various vascular territories involved, concern for embolic process. MRA head/neck negative for large vessel occlusion. Echo with bubble study was negative for intracardiac process; however, follow up TEE with mitral valve endocarditis as above. 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 is following. He was dialyzed yesterday. Anion gap seems to be improving. Suspect this was metabolic vs infectious in setting of sepsis.  - Continue HD per nephrology - Continue sensipar and PhosLo  Anemia of chronic disease  Baseline Hb ~9; His last ESA was 2/21 at outpatient dialysis.  - ESA with HD per nephrology   Liver cirrhosis 2/2 untreated hepatitis C Child Pugh B; MELD score 23  Does not appear to be acutely decompensated at this time. Would benefit from outpatient referral to hepatologist - Continue to monitor liver function   Hypertension - Continue amlodipine '10mg'$  daily, clonidine 0.'1mg'$  patch, hydralazine '50mg'$  bid, Metoprolol '75mg'$  bid - Goal is normotensive  Signed,   Harvie Heck, MD Internal Medicine, PGY-2 10/06/20 10:10 AM Pager # [phone]

## 2020-10-06 NOTE — Progress Notes (Signed)
[facility] Progress Note  Subjective: still awaiting CTS surgical evaluation for his extensive mitral valve endocarditis, meeting with surgeon this AM planned-  Also going to get HD later today - getting ready to walk with PT  Vitals:   10/05/20 1500 10/05/20 1938 10/05/20 2320 10/06/20 0313  BP: (!) 158/99 124/88 (!) 125/91 109/81  Pulse: 80 74 84 79  Resp: (!) '22 19 18 15  '$ Temp:  98 F (36.7 C) 98.2 F (36.8 C) 98.6 F (37 C)  TempSrc:  Oral Oral   SpO2: 100% 100% 99% 94%  Weight:      Height:        Exam:   Gen - alert, conversant  no jvd  Chest cta bilat  Cor reg no RG  Abd soft ntnd no ascites   Ext no LE edema   Alert, NF, ox3   LUA AVG +bruit     OP HD: [facility]   4h  350/500  82.5kg  2/2.5 bath  P1  Hep 5500  LUA AVG   Aranesp 92mg q2wks - last 09/27/20   Hectorol 320m IV qHD    Assessment/ Plan: 1. MRSA bacteremia: w/ fevers and AMS.  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 Has AVG LUA w/o gross signs of infection. Getting IV vanc. Now needing a surgical evaluation for this endocarditis 2. Acute CVA - multiple by MRI, embolic 3. AMS - due to infection +/- aphasia from CVA's. Resolved, Ox 3.  4. ESRD - usual HD MWF.  Next HD today via AVG.  5. Hypertension/volume  - Blood pressure chronically hard to control, on 4 bp lowering meds- amlodipine 10, clonidine #1 patch, hydralazine 50 BID, metoprolol 75 BID. BP's wnl. Wt is under 1-2kg. 1-2 L goal w/ next HD- will need EDW adjust at discharge.  6. Anemia of CKD - Hgb 9.3- now in the 8's.  Last esa at OP HD was 2/21, will inc and order here for tomorrow.  Iron sat low, holding iron given active endocarditis 7. Secondary Hyperparathyroidism -  Ca in goal, phos 5- 7.  Continue hectorol, sensipar and  phoslo 8. Nutrition - Renal diet w/fluid restriction  9. Hx Hep C /liver cirrhosis  KeLouis Meckel3/09/2020, 8:19 AM   Recent Labs  Lab 10/04/20 0211 10/05/20 0431   K 4.2 4.0  BUN 93* 53*  CREATININE 10.74* 7.85*  CALCIUM 8.0* 7.6*  PHOS 7.0* 6.0*  HGB 8.7* 8.7*   Inpatient medications: . amLODipine  10 mg Oral Daily  . calcium acetate  667 mg Oral TID WC  . calcium acetate  667 mg Oral With snacks  . Chlorhexidine Gluconate Cloth  6 each Topical Q0600  . cinacalcet  30 mg Oral Q breakfast  . cloNIDine  0.1 mg Transdermal Weekly  . darbepoetin (ARANESP) injection - DIALYSIS  100 mcg Intravenous Q Wed-HD  . doxercalciferol  3 mcg Intravenous Q M,W,F-HD  . feeding supplement (NEPRO CARB STEADY)  237 mL Oral BID BM  . hydrALAZINE  50 mg Oral BID  . metoprolol tartrate  75 mg Oral BID  . multivitamin  1 tablet Oral QHS  . vancomycin  500 mg Intravenous Q Wed-HD   . [START ON 10/08/2020] vancomycin     acetaminophen **OR** acetaminophen (TYLENOL) oral liquid 160 mg/5 mL **OR** acetaminophen, albuterol, HYDROmorphone (DILAUDID) injection, ondansetron **OR** ondansetron (ZOFRAN) IV, oxyCODONE, polyethylene glycol

## 2020-10-06 NOTE — Progress Notes (Addendum)
[facility] for Infectious Disease    Date of Admission:  09/27/2020   Total days of antibiotics 5           ID: Patrick Anthony is a 50 y.o. 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 Principal Problem:   Bacterial endocarditis Active Problems:   Chronic combined systolic and diastolic congestive heart failure (HCC)   HTN (hypertension)   Anemia in chronic kidney disease   ESRD on dialysis ([place])   End stage renal disease (HCC)   Sepsis ([place])   Pneumonia due to infectious agent   ADPKD (autosomal dominant polycystic kidney disease)   Sepsis due to pneumonia (HCC)   Lactic acidosis   Cerebral embolism with cerebral infarction   Bacteremia due to Staphylococcus aureus   Mitral regurgitation   Uncontrolled hypertension   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   Patent foramen ovale    Subjective: No complaint No N/V/D/HA No dyspnea/chest pain  ROS: 12 point ros is negative   Medications:  . amLODipine  10 mg Oral Daily  . calcium acetate  667 mg Oral TID WC  . calcium acetate  667 mg Oral With snacks  . Chlorhexidine Gluconate Cloth  6 each Topical Q0600  . cinacalcet  30 mg Oral Q breakfast  . cloNIDine  0.1 mg Transdermal Weekly  . darbepoetin (ARANESP) injection - DIALYSIS  100 mcg Intravenous Q Wed-HD  . doxercalciferol  3 mcg Intravenous Q M,W,F-HD  . feeding supplement (NEPRO CARB STEADY)  237 mL Oral BID BM  . hydrALAZINE  50 mg Oral BID  . metoprolol tartrate  75 mg Oral BID  . multivitamin  1 tablet Oral QHS  . vancomycin  500 mg Intravenous Q Wed-HD    Objective: Vital signs in last 24 hours: Temp:  [97.6 F (36.4 C)-98.6 F (37 C)] 97.6 F (36.4 C) (03/02 0833) Pulse Rate:  [74-84] 81 (03/02 0833) Resp:  [15-22] 16 (03/02 0833) BP: (109-158)/(81-99) 137/92 (03/02 0833) SpO2:  [94 %-100 %] 99 % (03/02 0833)  Physical Exam  Constitutional: no distress, conversant HEENT: conj clear; normocephalic;  oropharynx clear Cardiovascular: rrr 2/6 systolic murmur Pulmonary/Chest: Effort normal; cta Abdominal: Soft/nt Neurological: nonfocal Psych alert/oriented, mood apprpriate Skin no rash; left ue AVG site good thrill without swelling/fluctuance/tenderness    Lab Results Recent Labs    10/04/20 0211 10/05/20 0431  WBC 26.0* 20.6*  HGB 8.7* 8.7*  HCT 25.3* 26.1*  NA 132* 134*  K 4.2 4.0  CL 91* 94*  CO2 23 25  BUN 93* 53*  CREATININE 10.74* 7.85*   Liver Panel Recent Labs    10/04/20 0211 10/05/20 0431  PROT 7.4 7.6  ALBUMIN 2.2* 2.2*  AST 22 25  ALT 12 13  ALKPHOS 75 70  BILITOT 1.2 1.1    Microbiology: 2/24 blood cx NGTD 2/21 blood cx MRSA   Studies/Results: 2/22 brain mri 1. 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 Multiple vascular territories are involved and findings are highly suspicious for an embolic process. 2. 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 A few scattered supratentorial chronic microhemorrhages are also present. 3. Mild generalized atrophy of the brain  2/22 tte 1. Left ventricular ejection fraction, by estimation, is 55 to 60%. The  left ventricle has normal function. The left ventricle has no regional  wall motion abnormalities. There  is moderate concentric left ventricular  hypertrophy. Left ventricular  diastolic parameters are consistent with Grade II diastolic dysfunction  (pseudonormalization).  2. Right ventricular systolic function is normal. The right ventricular  size is normal. There is normal pulmonary artery systolic pressure.  3. Left atrial size was moderately dilated.  4. Right atrial size was mild to moderately dilated.  5. The mitral valve is normal in structure. Mild to moderate mitral valve  regurgitation. No evidence of mitral stenosis. Moderate  to severe mitral  annular calcification.  6. The aortic valve is tricuspid. Aortic valve regurgitation is trivial.  No aortic stenosis is present.  7. The inferior vena cava is normal in size with greater than 50%  respiratory variability, suggesting right atrial pressure of 3 mmHg.  8. Agitated saline contrast bubble study was negative, with no evidence  of any interatrial shunt.  2/23 chest abd pelv ct 1. Cavitary mass in the left upper lobe apex measuring up to 2.2 cm with surrounding ground-glass density. Findings could be secondary to cavitary infection or potential septic embolus. A cavitary neoplasm is also in the differential. There are additional small foci of consolidation and ground-glass density in the right upper lobe and small ground-glass nodule in left upper lobe, potentially infectious or inflammatory. Short interval chest CT follow-up is recommended. 2. Polycystic kidney disease. 3. No CT evidence for acute intra-abdominal or pelvic abnormality. 4. Bulky calcified mass anterior to right shoulder with smaller left paraspinal soft tissue calcification probably due to calcinosis related to chronic kidney disease  2/24 mr lumbar thoracic 1. No thoracolumbar discitis-osteomyelitis or epidural collection. 2. Mild bone marrow edema and disc space edema at C6-C7, likely degenerative. 3. Mild L5-S1 degenerative disc disease without spinal canal or neural foraminal stenosis  Assessment/Plan: Abx: 2/21-c vanc  50yo M with ESRD dialyzed via lue gore-tex avg (placed 10/2019), hep c (no prior tx) admitted 2/21 with 10 day malaise, fever, found to have sepsis due to MRSA bacteremia c/b embolic stroke presumably from endocarditis  Complications: CNS septic emboli, pulmonary cavitary lesions/septic emboli. Has lower back pain but negative mri lumbar thoracic  Doesn't appear AVG clinically involved with mrsa bsi at this time. But if relapse after tx, should investigate  further  2/21 & 2/24 bcx mrsa 2/27 bcx negative  3/01 tee showed LAA thrombus and 1.5 cm MV vegetation without significant MR. Will need cts input and potentially also anticoagulation  Overall improving mentation/clinical status  -continue vancomycin -await CTS input regarding if patient needs surgical intervention, although no obvious immediate indication at this time -defer anticoagulation decision to cardiology, but in setting septic cns lesion, likely would have to wait  -pending hep c genotype -- can f/u id clinic   -duration vancomycin 6 weeks from 2/27 to 11/14/2020; to receive with dialysis; no need for opat -goals vanc trough pre-dialysis level 20-25 -weekly cbc, cmp, crp  -id clinic f/u as below Fax weekly labs to [phone]  Clinic Follow Up Appt: 3/24 @ 330 with dr Gale Journey  @  [facility] [place], [place], [place] [address] Phone: [phone]     [facility]  I spent more than 30 minute reviewing data/chart, and coordinating care and >50% direct face to face time providing counseling/discussing diagnostics/treatment plan with patient   10/06/2020, 11:16 AM

## 2020-10-07 ENCOUNTER — Encounter (HOSPITAL_COMMUNITY)
Admission: EM | Disposition: A | Discharge status: Skilled Nursing Facility | Payer: Self-pay | Source: Home / Self Care | Attending: Internal Medicine

## 2020-10-07 ENCOUNTER — Encounter (HOSPITAL_COMMUNITY): Payer: Self-pay | Admitting: Cardiovascular Disease

## 2020-10-07 DIAGNOSIS — D631 Anemia in chronic kidney disease: Secondary | ICD-10-CM | POA: Diagnosis not present

## 2020-10-07 DIAGNOSIS — I339 Acute and subacute endocarditis, unspecified: Secondary | ICD-10-CM | POA: Diagnosis not present

## 2020-10-07 DIAGNOSIS — B9561 Methicillin susceptible Staphylococcus aureus infection as the cause of diseases classified elsewhere: Secondary | ICD-10-CM | POA: Diagnosis not present

## 2020-10-07 DIAGNOSIS — R7881 Bacteremia: Secondary | ICD-10-CM | POA: Diagnosis not present

## 2020-10-07 DIAGNOSIS — J189 Pneumonia, unspecified organism: Secondary | ICD-10-CM | POA: Diagnosis not present

## 2020-10-07 DIAGNOSIS — Z992 Dependence on renal dialysis: Secondary | ICD-10-CM | POA: Diagnosis not present

## 2020-10-07 DIAGNOSIS — R531 Weakness: Secondary | ICD-10-CM | POA: Diagnosis not present

## 2020-10-07 DIAGNOSIS — R0602 Shortness of breath: Secondary | ICD-10-CM | POA: Diagnosis not present

## 2020-10-07 DIAGNOSIS — I330 Acute and subacute infective endocarditis: Secondary | ICD-10-CM | POA: Diagnosis not present

## 2020-10-07 DIAGNOSIS — A419 Sepsis, unspecified organism: Secondary | ICD-10-CM | POA: Diagnosis not present

## 2020-10-07 DIAGNOSIS — Q612 Polycystic kidney, adult type: Secondary | ICD-10-CM | POA: Diagnosis not present

## 2020-10-07 DIAGNOSIS — N186 End stage renal disease: Secondary | ICD-10-CM | POA: Diagnosis not present

## 2020-10-07 DIAGNOSIS — I132 Hypertensive heart and chronic kidney disease with heart failure and with stage 5 chronic kidney disease, or end stage renal disease: Secondary | ICD-10-CM | POA: Diagnosis not present

## 2020-10-07 DIAGNOSIS — I5032 Chronic diastolic (congestive) heart failure: Secondary | ICD-10-CM | POA: Diagnosis not present

## 2020-10-07 DIAGNOSIS — I639 Cerebral infarction, unspecified: Secondary | ICD-10-CM | POA: Diagnosis not present

## 2020-10-07 DIAGNOSIS — I350 Nonrheumatic aortic (valve) stenosis: Secondary | ICD-10-CM | POA: Diagnosis not present

## 2020-10-07 HISTORY — PX: LEFT HEART CATH AND CORONARY ANGIOGRAPHY: CATH118249

## 2020-10-07 LAB — POCT I-STAT EG7
Acid-Base Excess: 7 mmol/L — ABNORMAL HIGH (ref 0.0–2.0)
Acid-Base Excess: 5 mmol/L — ABNORMAL HIGH (ref 0.0–2.0)
Bicarbonate: 32.3 mmol/L — ABNORMAL HIGH (ref 20.0–28.0)
Bicarbonate: 30.1 mmol/L — ABNORMAL HIGH (ref 20.0–28.0)
Calcium, Ion: 1.03 mmol/L — ABNORMAL LOW (ref 1.15–1.40)
Calcium, Ion: 1.01 mmol/L — ABNORMAL LOW (ref 1.15–1.40)
HCT: 28 % — ABNORMAL LOW (ref 39.0–52.0)
HCT: 28 % — ABNORMAL LOW (ref 39.0–52.0)
Hemoglobin: 9.5 g/dL — ABNORMAL LOW (ref 13.0–17.0)
Hemoglobin: 9.5 g/dL — ABNORMAL LOW (ref 13.0–17.0)
O2 Saturation: 85 %
O2 Saturation: 85 %
Potassium: 4.3 mmol/L (ref 3.5–5.1)
Potassium: 4.3 mmol/L (ref 3.5–5.1)
Sodium: 136 mmol/L (ref 135–145)
Sodium: 136 mmol/L (ref 135–145)
TCO2: 34 mmol/L — ABNORMAL HIGH (ref 22–32)
TCO2: 31 mmol/L (ref 22–32)
pCO2, Ven: 48.7 mmHg (ref 44.0–60.0)
pCO2, Ven: 45.7 mmHg (ref 44.0–60.0)
pH, Ven: 7.43 (ref 7.250–7.430)
pH, Ven: 7.426 (ref 7.250–7.430)
pO2, Ven: 50 mmHg — ABNORMAL HIGH (ref 32.0–45.0)
pO2, Ven: 49 mmHg — ABNORMAL HIGH (ref 32.0–45.0)

## 2020-10-07 LAB — CBC
HCT: 25.4 % — ABNORMAL LOW (ref 39.0–52.0)
Hemoglobin: 8.4 g/dL — ABNORMAL LOW (ref 13.0–17.0)
MCH: 27.6 pg (ref 26.0–34.0)
MCHC: 33.1 g/dL (ref 30.0–36.0)
MCV: 83.6 fL (ref 80.0–100.0)
Platelets: 270 10*3/uL (ref 150–400)
RBC: 3.04 MIL/uL — ABNORMAL LOW (ref 4.22–5.81)
RDW: 17.3 % — ABNORMAL HIGH (ref 11.5–15.5)
WBC: 19.1 10*3/uL — ABNORMAL HIGH (ref 4.0–10.5)
nRBC: 0 % (ref 0.0–0.2)

## 2020-10-07 LAB — POCT I-STAT 7, (LYTES, BLD GAS, ICA,H+H)
Acid-Base Excess: 6 mmol/L — ABNORMAL HIGH (ref 0.0–2.0)
Bicarbonate: 30.4 mmol/L — ABNORMAL HIGH (ref 20.0–28.0)
Calcium, Ion: 0.99 mmol/L — ABNORMAL LOW (ref 1.15–1.40)
HCT: 27 % — ABNORMAL LOW (ref 39.0–52.0)
Hemoglobin: 9.2 g/dL — ABNORMAL LOW (ref 13.0–17.0)
O2 Saturation: 100 %
Potassium: 4.3 mmol/L (ref 3.5–5.1)
Sodium: 134 mmol/L — ABNORMAL LOW (ref 135–145)
TCO2: 32 mmol/L (ref 22–32)
pCO2 arterial: 43.4 mmHg (ref 32.0–48.0)
pH, Arterial: 7.453 — ABNORMAL HIGH (ref 7.350–7.450)
pO2, Arterial: 177 mmHg — ABNORMAL HIGH (ref 83.0–108.0)

## 2020-10-07 LAB — COMPREHENSIVE METABOLIC PANEL
ALT: 16 U/L (ref 0–44)
AST: 33 U/L (ref 15–41)
Albumin: 2.2 g/dL — ABNORMAL LOW (ref 3.5–5.0)
Alkaline Phosphatase: 57 U/L (ref 38–126)
Anion gap: 16 — ABNORMAL HIGH (ref 5–15)
BUN: 34 mg/dL — ABNORMAL HIGH (ref 6–20)
CO2: 23 mmol/L (ref 22–32)
Calcium: 7.9 mg/dL — ABNORMAL LOW (ref 8.9–10.3)
Chloride: 94 mmol/L — ABNORMAL LOW (ref 98–111)
Creatinine, Ser: 6.39 mg/dL — ABNORMAL HIGH (ref 0.61–1.24)
GFR, Estimated: 10 mL/min — ABNORMAL LOW (ref >60)
Glucose, Bld: 102 mg/dL — ABNORMAL HIGH (ref 70–99)
Potassium: 4.2 mmol/L (ref 3.5–5.1)
Sodium: 133 mmol/L — ABNORMAL LOW (ref 135–145)
Total Bilirubin: 0.8 mg/dL (ref 0.3–1.2)
Total Protein: 7.8 g/dL (ref 6.5–8.1)

## 2020-10-07 LAB — PREALBUMIN: Prealbumin: 12.9 mg/dL — ABNORMAL LOW (ref 18–38)

## 2020-10-07 SURGERY — RIGHT/LEFT HEART CATH AND CORONARY ANGIOGRAPHY
Anesthesia: LOCAL

## 2020-10-07 SURGERY — LEFT HEART CATH AND CORONARY ANGIOGRAPHY
Anesthesia: LOCAL

## 2020-10-07 MED ORDER — LIDOCAINE HCL (PF) 1 % IJ SOLN
INTRAMUSCULAR | Status: AC
  Filled 2020-10-07: qty 30

## 2020-10-07 MED ORDER — SODIUM CHLORIDE 0.9% FLUSH: 3.0000 mL | INTRAVENOUS | Status: DC | PRN

## 2020-10-07 MED ORDER — FENTANYL CITRATE (PF) 100 MCG/2ML IJ SOLN
INTRAMUSCULAR | Status: AC
  Filled 2020-10-07: qty 2

## 2020-10-07 MED ORDER — SODIUM CHLORIDE 0.9% FLUSH
3.0000 mL | Freq: Two times a day (BID) | INTRAVENOUS | Status: DC
  Administered 2020-10-07: 3 mL via INTRAVENOUS

## 2020-10-07 MED ORDER — SODIUM CHLORIDE 0.9 % IV SOLN
INTRAVENOUS | Status: AC | PRN
  Administered 2020-10-07: 10 mL/h via INTRAVENOUS

## 2020-10-07 MED ORDER — CALCIUM ACETATE (PHOS BINDER) 667 MG PO CAPS
1334.0000 mg | ORAL_CAPSULE | Freq: Three times a day (TID) | ORAL | Status: DC
  Administered 2020-10-07 – 2020-10-13 (×11): 1334 mg via ORAL
  Filled 2020-10-07 (×15): qty 2

## 2020-10-07 MED ORDER — LIDOCAINE HCL (PF) 1 % IJ SOLN
INTRAMUSCULAR | Status: DC | PRN
  Administered 2020-10-07: 15 mL

## 2020-10-07 MED ORDER — SODIUM CHLORIDE 0.9 % IV SOLN: 250.0000 mL | INTRAVENOUS | Status: DC | PRN

## 2020-10-07 MED ORDER — SODIUM CHLORIDE 0.9 % IV SOLN: INTRAVENOUS | Status: DC

## 2020-10-07 MED ORDER — FENTANYL CITRATE (PF) 100 MCG/2ML IJ SOLN
INTRAMUSCULAR | Status: DC | PRN
  Administered 2020-10-07 (×2): 25 ug via INTRAVENOUS

## 2020-10-07 MED ORDER — HEPARIN (PORCINE) IN NACL 1000-0.9 UT/500ML-% IV SOLN
INTRAVENOUS | Status: AC
  Filled 2020-10-07: qty 1500

## 2020-10-07 MED ORDER — MORPHINE SULFATE (PF) 2 MG/ML IV SOLN
2.0000 mg | INTRAVENOUS | Status: DC | PRN
Start: 2020-10-07 — End: 2020-10-14
  Administered 2020-10-12 – 2020-10-13 (×2): 2 mg via INTRAVENOUS
  Filled 2020-10-07 (×2): qty 1

## 2020-10-07 MED ORDER — ASPIRIN 81 MG PO CHEW: 81.0000 mg | CHEWABLE_TABLET | ORAL | Status: DC

## 2020-10-07 MED ORDER — ASPIRIN 81 MG PO CHEW
81.0000 mg | CHEWABLE_TABLET | ORAL | Status: AC
  Administered 2020-10-07: 81 mg via ORAL
  Filled 2020-10-07: qty 1

## 2020-10-07 MED ORDER — ACETAMINOPHEN 325 MG PO TABS
650.0000 mg | ORAL_TABLET | ORAL | Status: DC | PRN
  Administered 2020-10-11 – 2020-10-13 (×2): 650 mg via ORAL
  Filled 2020-10-07 (×2): qty 2

## 2020-10-07 MED ORDER — HYDRALAZINE HCL 20 MG/ML IJ SOLN: 10.0000 mg | INTRAMUSCULAR | Status: AC | PRN

## 2020-10-07 MED ORDER — SODIUM CHLORIDE 0.9 % IV SOLN: INTRAVENOUS | Status: AC

## 2020-10-07 MED ORDER — ONDANSETRON HCL 4 MG/2ML IJ SOLN: 4.0000 mg | Freq: Four times a day (QID) | INTRAMUSCULAR | Status: DC | PRN

## 2020-10-07 MED ORDER — MIDAZOLAM HCL 2 MG/2ML IJ SOLN
INTRAMUSCULAR | Status: AC
  Filled 2020-10-07: qty 2

## 2020-10-07 MED ORDER — HEPARIN (PORCINE) IN NACL 1000-0.9 UT/500ML-% IV SOLN
INTRAVENOUS | Status: DC | PRN
  Administered 2020-10-07 (×2): 500 mL

## 2020-10-07 MED ORDER — MIDAZOLAM HCL 2 MG/2ML IJ SOLN
INTRAMUSCULAR | Status: DC | PRN
  Administered 2020-10-07 (×2): 1 mg via INTRAVENOUS

## 2020-10-07 MED ORDER — SODIUM CHLORIDE 0.9% FLUSH
3.0000 mL | Freq: Two times a day (BID) | INTRAVENOUS | Status: DC
  Administered 2020-10-07 – 2020-10-13 (×12): 3 mL via INTRAVENOUS

## 2020-10-07 MED ORDER — LABETALOL HCL 5 MG/ML IV SOLN: 10.0000 mg | INTRAVENOUS | Status: AC | PRN

## 2020-10-07 MED ORDER — IOHEXOL 350 MG/ML SOLN
INTRAVENOUS | Status: DC | PRN
  Administered 2020-10-07: 30 mL

## 2020-10-07 SURGICAL SUPPLY — 9 items
CATH INFINITI 5FR MULTPACK ANG (CATHETERS) ×2 IMPLANT
CATH SWAN GANZ 7F STRAIGHT (CATHETERS) ×2 IMPLANT
KIT HEART LEFT (KITS) ×2 IMPLANT
PACK CARDIAC CATHETERIZATION (CUSTOM PROCEDURE TRAY) ×2 IMPLANT
SHEATH PINNACLE 5F 10CM (SHEATH) ×2 IMPLANT
SHEATH PINNACLE 7F 10CM (SHEATH) ×2 IMPLANT
TRANSDUCER W/STOPCOCK (MISCELLANEOUS) ×2 IMPLANT
TUBING CIL FLEX 10 FLL-RA (TUBING) ×2 IMPLANT
WIRE EMERALD 3MM-J .035X150CM (WIRE) ×2 IMPLANT

## 2020-10-07 NOTE — Progress Notes (Signed)
[facility] Progress Note  Subjective: Had HD yest-   Removed [id number]  No complications. He is overwhelmed by whole process-  Just wants to get it done.  Is due for heart cath today   Vitals:   10/07/20 0028 10/07/20 0127 10/07/20 0431 10/07/20 0753  BP: 114/74  91/71 109/76  Pulse: 72  78 78  Resp: '19  19 18  '$ Temp: 98.6 F (37 C)  98.9 F (37.2 C) 98.4 F (36.9 C)  TempSrc: Oral  Axillary Oral  SpO2: 98%  98% 98%  Weight:  77.4 kg    Height:        Exam:   Gen - alert, conversant  no jvd  Chest cta bilat  Cor reg no RG  Abd soft ntnd no ascites   Ext no obvious LE edema   Alert, NF, ox3   LUA AVG +bruit     [facility]   4h  350/500  82.5kg  2/2.5 bath  P1  Hep 5500  LUA AVG   Aranesp 22mg q2wks - last 09/27/20   Hectorol 368m IV qHD    Assessment/ Plan: 1. MRSA bacteremia: w/ fevers and AMS.  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 Has AVG LUA w/o gross signs of infection. Getting IV vanc. Now needing a surgical evaluation for this endocarditis-  Seems like candidate for surgery-  Getting heart cath today  2. 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 AMS - due to infection +/- aphasia from CVA's. Resolved, Ox 3.  4. ESRD - usual HD MWF.  Next HD tomorrow via AVG.  5. Hypertension/volume  - Blood pressure chronically hard to control, on 4 bp lowering meds- amlodipine 10, clonidine #1 patch, hydralazine 50 BID, metoprolol 75 BID. BP's wnl, even low right now-  I would like to take this opportunity to remove clonidine patch and wean him off clonidine. Wt is under 1-2kg. 1-2 L goal w/ next HD- will need EDW adjust at discharge.   6. Anemia of CKD - Hgb 9.3- now in the 8's.  Last esa at OP HD was 2/21, will  order here  100 weekly.  Iron sat low, holding iron given active endocarditis 7. Secondary Hyperparathyroidism -  Ca in goal, phos up.  Continue hectorol, sensipar and  phoslo- inc dose 8. Nutrition -  Renal diet w/fluid restriction  9. Hx Hep C /liver cirrhosis  KeLouis Meckel3/10/2020, 8:50 AM   Recent Labs  Lab 10/05/20 0431 10/06/20 1529 10/07/20 0401  K 4.0 3.9 4.2  BUN 53* 77* 34*  CREATININE 7.85* 10.60* 6.39*  CALCIUM 7.6* 7.3* 7.9*  PHOS 6.0* 8.3*  --   HGB 8.7* 8.1* 8.4*   Inpatient medications: . amLODipine  10 mg Oral Daily  . calcium acetate  667 mg Oral TID WC  . calcium acetate  667 mg Oral With snacks  . Chlorhexidine Gluconate Cloth  6 each Topical Q0600  . cinacalcet  30 mg Oral Q breakfast  . cloNIDine  0.1 mg Transdermal Weekly  . darbepoetin (ARANESP) injection - DIALYSIS  100 mcg Intravenous Q Wed-HD  . doxercalciferol  3 mcg Intravenous Q M,W,F-HD  . feeding supplement (NEPRO CARB STEADY)  237 mL Oral BID BM  . hydrALAZINE  50 mg Oral BID  . metoprolol tartrate  75 mg Oral BID  . multivitamin  1 tablet Oral QHS  . sodium chloride flush  3  mL Intravenous Q12H   . sodium chloride    . sodium chloride 10 mL/hr at 10/07/20 [id number]  . [START ON 10/08/2020] vancomycin     sodium chloride, acetaminophen **OR** acetaminophen (TYLENOL) oral liquid 160 mg/5 mL **OR** acetaminophen, albuterol, HYDROmorphone (DILAUDID) injection, ondansetron **OR** ondansetron (ZOFRAN) IV, oxyCODONE, polyethylene glycol, sodium chloride flush

## 2020-10-07 NOTE — TOC Progression Note (Signed)
Transition of Care [facility]) - Progression Note    Patient Details  Name: Patrick Anthony MRN: [id number] Date of Birth: [birth year]  Transition [facility]) CM/SW [place], [place] Phone Number: 10/07/2020, 2:46 PM  Clinical Narrative:     CSW spoke with patient at bedside. Patient is in agreement to go to SNF. Patient gave CSW permission to fax out initial referral near [place] area. Patient is HD. HD days are Monday, Wednesday, and Friday at [employer] time is 5:30am.  Patient is from home alone. Patient gave CSW permission to speak with [name].  Patient has received both COVID vaccines. CSW discussed insurance authorization process  No further questions reported at this time. CSW to continue to follow and assist with discharge planning needs.    Expected Discharge Plan: [place] Barriers to Discharge: Continued Medical Work up  Expected Discharge Plan and Services Expected Discharge Plan: [place] In-house Referral: Clinical Social Work Discharge Planning Services: CM Consult Post Acute Care Choice: [place] Living arrangements for the past 2 months: Apartment                                       Social Determinants of Health (SDOH) Interventions    Readmission Risk Interventions No flowsheet data found.

## 2020-10-07 NOTE — Progress Notes (Signed)
Cardiology Progress Note  Patient ID: Patrick Anthony MRN: [id number] DOB: May 27, 1971 Date of Encounter: 10/07/2020  Primary Cardiologist: Fransico Him, MD  Subjective   Chief Complaint: None.  HPI: Left heart catheterization with no obstructive CAD.  Ongoing work-up for mitral valve surgery.  Denies symptoms today.  ROS:  All other ROS reviewed and negative. Pertinent positives noted in the HPI.     Inpatient Medications  Scheduled Meds: . [MAR Hold] amLODipine  10 mg Oral Daily  . [MAR Hold] calcium acetate  1,334 mg Oral TID WC  . [MAR Hold] calcium acetate  667 mg Oral With snacks  . [MAR Hold] Chlorhexidine Gluconate Cloth  6 each Topical Q0600  . [MAR Hold] cinacalcet  30 mg Oral Q breakfast  . [MAR Hold] darbepoetin (ARANESP) injection - DIALYSIS  100 mcg Intravenous Q Wed-HD  . [MAR Hold] doxercalciferol  3 mcg Intravenous Q M,W,F-HD  . [MAR Hold] feeding supplement (NEPRO CARB STEADY)  237 mL Oral BID BM  . [MAR Hold] hydrALAZINE  50 mg Oral BID  . [MAR Hold] metoprolol tartrate  75 mg Oral BID  . [MAR Hold] multivitamin  1 tablet Oral QHS  . sodium chloride flush  3 mL Intravenous Q12H   Continuous Infusions: . sodium chloride    . sodium chloride 10 mL/hr at 10/07/20 [id number]  . [MAR Hold] vancomycin     PRN Meds: sodium chloride, [MAR Hold] acetaminophen **OR** [MAR Hold] acetaminophen (TYLENOL) oral liquid 160 mg/5 mL **OR** [MAR Hold] acetaminophen, [MAR Hold] albuterol, [MAR Hold]  HYDROmorphone (DILAUDID) injection, [MAR Hold] ondansetron **OR** [MAR Hold] ondansetron (ZOFRAN) IV, [MAR Hold] oxyCODONE, [MAR Hold] polyethylene glycol, sodium chloride flush   Vital Signs   Vitals:   10/07/20 1050 10/07/20 1055 10/07/20 1100 10/07/20 1105  BP: 124/87 125/86 123/88 117/90  Pulse: 76 79 78 80  Resp: (!) 24 (!) '21 18 18  '$ Temp:      TempSrc:      SpO2: 99% 98% 99% 98%  Weight:      Height:        Intake/Output Summary (Last 24 hours) at 10/07/2020 1148 Last data  filed at 10/07/2020 [id number] Gross per 24 hour  Intake 11.32 ml  Output 2000 ml  Net -1988.68 ml   Last 3 Weights 10/07/2020 10/06/2020 10/06/2020  Weight (lbs) 170 lb 10.2 oz 168 lb 10.4 oz 173 lb 11.6 oz  Weight (kg) 77.4 kg 76.5 kg 78.8 kg      Physical Exam   Vitals:   10/07/20 1050 10/07/20 1055 10/07/20 1100 10/07/20 1105  BP: 124/87 125/86 123/88 117/90  Pulse: 76 79 78 80  Resp: (!) 24 (!) '21 18 18  '$ Temp:      TempSrc:      SpO2: 99% 98% 99% 98%  Weight:      Height:         Intake/Output Summary (Last 24 hours) at 10/07/2020 1148 Last data filed at 10/07/2020 [id number] Gross per 24 hour  Intake 11.32 ml  Output 2000 ml  Net -1988.68 ml    Last 3 Weights 10/07/2020 10/06/2020 10/06/2020  Weight (lbs) 170 lb 10.2 oz 168 lb 10.4 oz 173 lb 11.6 oz  Weight (kg) 77.4 kg 76.5 kg 78.8 kg    Body mass index is 23.14 kg/m.  General: Well nourished, well developed, in no acute distress Head: Atraumatic, normal size  Eyes: PEERLA, EOMI  Neck: Supple, no JVD Endocrine: No thryomegaly Cardiac: Normal S1, S2; RRR; 3 out of  6 harsh holosystolic murmur Lungs: Clear to auscultation bilaterally, no wheezing, rhonchi or rales  Abd: Soft, nontender, no hepatomegaly  Ext: No edema, pulses 2+ Musculoskeletal: No deformities, BUE and BLE strength normal and equal Skin: Warm and dry, no rashes   Neuro: Alert and oriented to person, place, time, and situation, CNII-XII grossly intact, no focal deficits  Psych: Normal mood and affect   Labs  High Sensitivity Troponin:  No results for input(s): TROPONINIHS in the last 720 hours.   Cardiac EnzymesNo results for input(s): TROPONINI in the last 168 hours. No results for input(s): TROPIPOC in the last 168 hours.  Chemistry Recent Labs  Lab 10/04/20 0211 10/05/20 0431 10/06/20 1529 10/07/20 0401  NA 132* 134* 133* 133*  K 4.2 4.0 3.9 4.2  CL 91* 94* 93* 94*  CO2 '23 25 22 23  '$ GLUCOSE 119* 115* 109* 102*  BUN 93* 53* 77* 34*  CREATININE 10.74* 7.85*  10.60* 6.39*  CALCIUM 8.0* 7.6* 7.3* 7.9*  PROT 7.4 7.6  --  7.8  ALBUMIN 2.2* 2.2* 2.1* 2.2*  AST 22 25  --  33  ALT 12 13  --  16  ALKPHOS 75 70  --  57  BILITOT 1.2 1.1  --  0.8  GFRNONAA 5* 8* 5* 10*  ANIONGAP 18* 15 18* 16*    Hematology Recent Labs  Lab 10/05/20 0431 10/06/20 1529 10/07/20 0401  WBC 20.6* 18.7* 19.1*  RBC 3.16* 2.94* 3.04*  HGB 8.7* 8.1* 8.4*  HCT 26.1* 24.5* 25.4*  MCV 82.6 83.3 83.6  MCH 27.5 27.6 27.6  MCHC 33.3 33.1 33.1  RDW 17.2* 17.3* 17.3*  PLT 230 221 270   BNPNo results for input(s): BNP, PROBNP in the last 168 hours.  DDimer No results for input(s): DDIMER in the last 168 hours.   Radiology  DG Orthopantogram  Result Date: 10/06/2020 CLINICAL DATA:  Preop evaluation EXAM: ORTHOPANTOGRAM/PANORAMIC COMPARISON:  None. FINDINGS: Panoramic view of the mandible reveals no acute fracture. Lucency is seen surrounding the root of the left maxillary canine tooth with extensive erosive changes consistent with dental caries. First maxillary bicuspid on the left also demonstrates extensive dental caries. Erosive changes of the lateral incisors in the mandible are noted as well consistent with dental caries. IMPRESSION: Multifocal dental caries. Periapical lucency involving the left maxillary canine tooth. Electronically Signed   By: Inez Catalina M.D.   On: 10/06/2020 16:00   DG Chest 2 View  Result Date: 10/06/2020 CLINICAL DATA:  Left upper lobe lung mass EXAM: CHEST - 2 VIEW COMPARISON:  09/29/2020 FINDINGS: Cardiac shadow is enlarged. The lungs are well aerated bilaterally. Known cavitary left upper lobe mass is obscured by overlying EKG lead. Vascular stent is noted on the left. Calcified soft tissue mass is noted about the right shoulder stable from prior CT. IMPRESSION: Left upper lobe mass lesion is not well appreciated on this exam. Chronic changes as described above. Electronically Signed   By: Inez Catalina M.D.   On: 10/06/2020 16:02   CARDIAC  CATHETERIZATION  Result Date: 10/07/2020 Patrick Anthony is a 50 y.o. male  [id number] LOCATION:  FACILITY: [facility] PHYSICIAN: Quay Burow, M.D. 1970/10/11 DATE OF PROCEDURE:  10/07/2020 DATE OF DISCHARGE: CARDIAC CATHETERIZATION History obtained from chart review.Abraam Turturro is a 50 y.o. 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  A 2D echo revealed vegetation on the mitral valve with mild to moderate mitral regurgitation.  He was seen in consultation by Dr. Roxy Manns, cardiothoracic surgery for potential operative therapy.  Right left heart cath was recommended to define his anatomy and physiology. HEMODYNAMICS: Right heart cath Right atrial pressure-15/8, mean 7 Right ventricular pressure-43/3 Pulmonary artery pressure-37/8, mean 23 Pulmonary artery wedge pressure-A-wave 19, V wave 19, mean 14 LVEDP-22 Cardiac output-15.4 L/min with an index of 7.76 L/min/m.    Mr. Salazar has clean coronary arteries, mildly elevated LVEDP and no evidence of a large V wave.  If he needs mitral valve replacement/repair his coronary anatomy is clean.  I did not place closure devices since the patient has potential SBE and sepsis I do not want to leave a foreign body in the patient's artery or vein.  The sheath will be removed and pressure held.  The patient left lab in stable condition. Quay Burow. MD, [facility] 10/07/2020 10:10 AM    Cardiac Studies  TEE 10/05/2020 1. Left ventricular ejection fraction, by estimation, is 60 to 65%. The  left ventricle has normal function. There is moderate left ventricular  hypertrophy.  2. Right ventricular systolic function is normal. The right ventricular  size is  normal.  3. Left atrial size was moderately dilated. No left atrial/left atrial  appendage thrombus was detected.  4. Right atrial size was moderately dilated.  5. Large vegetation on the mitral valve.  6. The posterior mitral leaflet appears perforated. There is extensive  vegetation of both mitral leaflets, visualized with 2D/3D methods. The  mitral valve is abnormal. Mild to moderate mitral valve regurgitation.  7. The aortic valve is tricuspid. Aortic valve regurgitation is not  visualized.  8. There is mild (Grade II) protruding plaque involving the transverse  and descending aorta.  9. Agitated saline contrast bubble study was negative, with no evidence  of any interatrial shunt.   Patient Profile  Tajaun Butchart is a 50 y.o. male with ESRD on hemodialysis, hypertension, OSA, diastolic heart failure, liver cirrhosis due to hepatitis C who was admitted with generalized weakness and found to have new onset aphasia with embolic strokes.  Blood cultures positive for MRSA.  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  Assessment & Plan   1.  Mitral valve endocarditis/perforated mitral valve -MRSA bacteremia.  Found to have large vegetations on the posterior and anterior mitral valve leaflets with likely perforation of the posterior leaflet. -Left heart catheterization today with no significant CAD. -Ongoing work-up for mitral valve surgery. -I do agree with this. -I will follow up tomorrow on his right femoral cath site.  He was doing well post procedure this morning.  For questions or updates, please contact [place] Please consult [email or web address] for contact info under   Time Spent with Patient: I have spent a total of 25 minutes with patient reviewing hospital notes, telemetry, EKGs, labs and examining the patient as well as establishing an assessment and plan that was discussed with the patient.  > 50% of time was spent in direct patient care.    Signed, Addison Naegeli.  Audie Box, [place]  10/07/2020 11:48 AM
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
## 2020-10-07 NOTE — Progress Notes (Signed)
Cath Lab notified of pt agreement for the procedure and ready to come for the cath. Per Cath lab staff, Dr. Gwenlyn Found will be notified. Delia Heady RN

## 2020-10-07 NOTE — Progress Notes (Signed)
PT Cancellation Note  Patient Details Name: Patrick Anthony MRN: [id number] DOB: June 26, 1971   Cancelled Treatment:    Reason Eval/Treat Not Completed: Patient at procedure or test/unavailable. Pt off unit for cardiac cath earlier in day with femoral access. Pt on bedrest at this time. PT will follow as time allows.   Zenaida Niece 10/07/2020, 1:53 PM

## 2020-10-07 NOTE — Progress Notes (Signed)
[facility] for Infectious Disease    Date of Admission:  09/27/2020   Total days of antibiotics 5           ID: Patrick Anthony is a 50 y.o. 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 Principal Problem:   Bacterial endocarditis Active Problems:   Chronic combined systolic and diastolic congestive heart failure (HCC)   HTN (hypertension)   Anemia in chronic kidney disease   ESRD on dialysis ([place])   End stage renal disease (HCC)   Sepsis ([place])   Pneumonia due to infectious agent   ADPKD (autosomal dominant polycystic kidney disease)   Sepsis due to pneumonia (HCC)   Lactic acidosis   Cerebral embolism with cerebral infarction   Bacteremia due to Staphylococcus aureus   Mitral regurgitation   Uncontrolled hypertension   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   Patent foramen ovale    Subjective: No complaint No N/V/D/HA No dyspnea/chest pain  ROS: 12 point ros is negative   Medications:  . [MAR Hold] amLODipine  10 mg Oral Daily  . [MAR Hold] calcium acetate  1,334 mg Oral TID WC  . [MAR Hold] calcium acetate  667 mg Oral With snacks  . [MAR Hold] Chlorhexidine Gluconate Cloth  6 each Topical Q0600  . [MAR Hold] cinacalcet  30 mg Oral Q breakfast  . [MAR Hold] darbepoetin (ARANESP) injection - DIALYSIS  100 mcg Intravenous Q Wed-HD  . [MAR Hold] doxercalciferol  3 mcg Intravenous Q M,W,F-HD  . [MAR Hold] feeding supplement (NEPRO CARB STEADY)  237 mL Oral BID BM  . [MAR Hold] hydrALAZINE  50 mg Oral BID  . [MAR Hold] metoprolol tartrate  75 mg Oral BID  . [MAR Hold] multivitamin  1 tablet Oral QHS  . sodium chloride flush  3 mL Intravenous Q12H    Objective: Vital signs in last 24 hours: Temp:  [97.9 F (36.6 C)-98.9 F (37.2 C)] 98.4 F (36.9 C) (03/03 0753) Pulse Rate:  [70-85] 80 (03/03 1235) Resp:  [0-26] 17 (03/03 1235) BP: (91-138)/(59-94) 125/92 (03/03 1235) SpO2:  [97 %-100 %] 100 % (03/03 1235) Weight:   [76.5 kg-78.8 kg] 77.4 kg (03/03 0127)  Physical Exam  Constitutional: no distress, conversant HEENT: conj clear; normocephalic; oropharynx clear Cardiovascular: rrr 2/6 systolic murmur Pulmonary/Chest: Effort normal; cta Abdominal: Soft/nt Neurological: nonfocal Psych alert/oriented, mood apprpriate Skin no rash; left ue AVG site good thrill without swelling/fluctuance/tenderness    Lab Results Recent Labs    10/06/20 1529 10/07/20 0401  WBC 18.7* 19.1*  HGB 8.1* 8.4*  HCT 24.5* 25.4*  NA 133* 133*  K 3.9 4.2  CL 93* 94*  CO2 22 23  BUN 77* 34*  CREATININE 10.60* 6.39*   Liver Panel Recent Labs    10/05/20 0431 10/06/20 1529 10/07/20 0401  PROT 7.6  --  7.8  ALBUMIN 2.2* 2.1* 2.2*  AST 25  --  33  ALT 13  --  16  ALKPHOS 70  --  57  BILITOT 1.1  --  0.8    Microbiology: 2/24 blood cx NGTD 2/21 blood cx MRSA   Studies/Results: 2/22 brain mri 1. 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 Multiple vascular territories are involved and findings are highly suspicious for an embolic process. 2. 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 A few scattered supratentorial chronic microhemorrhages  are also present. 3. Mild generalized atrophy of the brain  2/22 tte 1. Left ventricular ejection fraction, by estimation, is 55 to 60%. The  left ventricle has normal function. The left ventricle has no regional  wall motion abnormalities. There is moderate concentric left ventricular  hypertrophy. Left ventricular  diastolic parameters are consistent with Grade II diastolic dysfunction  (pseudonormalization).  2. Right ventricular systolic function is normal. The right ventricular  size is normal. There is normal pulmonary artery systolic pressure.  3. Left atrial size was moderately dilated.  4. Right  atrial size was mild to moderately dilated.  5. The mitral valve is normal in structure. Mild to moderate mitral valve  regurgitation. No evidence of mitral stenosis. Moderate to severe mitral  annular calcification.  6. The aortic valve is tricuspid. Aortic valve regurgitation is trivial.  No aortic stenosis is present.  7. The inferior vena cava is normal in size with greater than 50%  respiratory variability, suggesting right atrial pressure of 3 mmHg.  8. Agitated saline contrast bubble study was negative, with no evidence  of any interatrial shunt.  2/23 chest abd pelv ct 1. Cavitary mass in the left upper lobe apex measuring up to 2.2 cm with surrounding ground-glass density. Findings could be secondary to cavitary infection or potential septic embolus. A cavitary neoplasm is also in the differential. There are additional small foci of consolidation and ground-glass density in the right upper lobe and small ground-glass nodule in left upper lobe, potentially infectious or inflammatory. Short interval chest CT follow-up is recommended. 2. Polycystic kidney disease. 3. No CT evidence for acute intra-abdominal or pelvic abnormality. 4. Bulky calcified mass anterior to right shoulder with smaller left paraspinal soft tissue calcification probably due to calcinosis related to chronic kidney disease  2/24 mr lumbar thoracic 1. No thoracolumbar discitis-osteomyelitis or epidural collection. 2. Mild bone marrow edema and disc space edema at C6-C7, likely degenerative. 3. Mild L5-S1 degenerative disc disease without spinal canal or neural foraminal stenosis  Assessment/Plan: Abx: 2/21-c vanc  49yo M with ESRD dialyzed via lue gore-tex avg (placed 10/2019), hep c (no prior tx) admitted 2/21 with 10 day malaise, fever, found to have sepsis due to MRSA bacteremia c/b embolic stroke presumably from endocarditis  Complications: CNS septic emboli, pulmonary cavitary lesions/septic  emboli. Has lower back pain but negative mri lumbar thoracic  Doesn't appear AVG clinically involved with mrsa bsi at this time. But if relapse after tx, should investigate further  2/21 & 2/24 bcx mrsa 2/27 bcx negative   ----- 3/03 assessment 3/01 tee showed LAA thrombus and 1.5 cm MV vegetation without significant MR. Overall improving mentation/clinical status. Awaiting CTS evaluation; potentially a surgical valve candidate    -continue vancomycin -await CTS input regarding if patient needs surgical intervention -defer anticoagulation decision to cardiology, but in setting septic cns lesion, likely would have to wait  -pending hep c genotype -- can f/u id clinic  -duration vancomycin 6 weeks from 2/27 to 11/14/2020; to receive with dialysis; no need for opat -goals vanc trough pre-dialysis level 20-25 -weekly cbc, cmp, crp  -id clinic f/u as below Fax weekly labs to [phone]  Clinic Follow Up Appt: 3/24 @ 330 with dr Gale Journey  @  [facility] [place], [place], [place] [address] Phone: [phone]     [facility]  I spent more than 30 minute reviewing data/chart, and coordinating care and >50% direct face to  face time providing counseling/discussing diagnostics/treatment plan with patient   10/07/2020, 1:07 PM

## 2020-10-07 NOTE — Progress Notes (Signed)
[address]       [place] [address]             [phone]     CARDIOTHORACIC SURGERY PROGRESS NOTE  Day of Surgery  S/P Procedure(s) (LRB): LEFT HEART CATH AND CORONARY ANGIOGRAPHY (N/A)  Subjective: No complaints.  Denies fevers, chills, chest pain or SOB  Objective: Vital signs in last 24 hours: Temp:  [97.9 F (36.6 C)-98.9 F (37.2 C)] 98.4 F (36.9 C) (03/03 0753) Pulse Rate:  [70-85] 74 (03/03 1305) Cardiac Rhythm: Normal sinus rhythm (03/03 1336) Resp:  [0-27] 21 (03/03 1305) BP: (91-138)/(59-94) 110/76 (03/03 1305) SpO2:  [97 %-100 %] 98 % (03/03 1305) Weight:  [76.5 kg-78.8 kg] 77.4 kg (03/03 0127)  Physical Exam:  Rhythm:   sinus  Breath sounds: clear  Heart sounds:  RRR  Incisions:  n/a  Abdomen:  soft  Extremities:  warm   Intake/Output from previous day: 03/02 0701 - 03/03 0700 In: 11.3 [I.V.:11.3] Out: 2000  Intake/Output this shift: Total I/O In: 240 [P.O.:240] Out: -   Lab Results: Recent Labs    10/06/20 1529 10/07/20 0401  WBC 18.7* 19.1*  HGB 8.1* 8.4*  HCT 24.5* 25.4*  PLT 221 270   BMET:  Recent Labs    10/06/20 1529 10/07/20 0401  NA 133* 133*  K 3.9 4.2  CL 93* 94*  CO2 22 23  GLUCOSE 109* 102*  BUN 77* 34*  CREATININE 10.60* 6.39*  CALCIUM 7.3* 7.9*    CBG (last 3)  No results for input(s): GLUCAP in the last 72 hours. PT/INR:  No results for input(s): LABPROT, INR in the last 72 hours.  CXR:  CHEST - 2 VIEW  COMPARISON:  09/29/2020  FINDINGS: Cardiac shadow is enlarged. The lungs are well aerated bilaterally. Known cavitary left upper lobe mass is obscured by overlying EKG lead. Vascular stent is noted on the left. Calcified soft tissue mass is noted about the right shoulder stable from prior CT.  IMPRESSION: Left upper lobe mass lesion is not well appreciated on this exam.  Chronic changes as described above.   Electronically Signed   By: Inez Catalina M.D.   On:  10/06/2020 16:02    LEFT HEART CATH AND CORONARY ANGIOGRAPHY    Conclusion  Maya Oria is a 50 y.o. male    [id number] LOCATION:  FACILITY: [facility]  PHYSICIAN: Quay Burow, M.D. March 22, 1971   DATE OF PROCEDURE:  10/07/2020  DATE OF DISCHARGE:     CARDIAC CATHETERIZATION     History obtained from chart review.Catherine Sama a 50 y.o.malewith medical history significant ofend-stage renal disease due to APKD on hemodialysis, Monday Wednesday Friday through left upper extremity AV graft, hypertension, obstructive sleep apnea, chronic diastolic congestive heart failure (EF 55-65%),liver cirrhosis due to hepatitis C and history alcohol abuse who came to hospital for evaluation of generalized weakness.  A 2D echo revealed vegetation on the mitral valve with mild to moderate mitral regurgitation.  He was seen in consultation by Dr. Roxy Manns, cardiothoracic surgery for potential operative therapy.  Right left heart cath was recommended to define his anatomy and physiology.   HEMODYNAMICS: Right heart cath  Right atrial pressure-15/8, mean 7 Right ventricular pressure-43/3 Pulmonary artery pressure-37/8, mean 23 Pulmonary artery wedge pressure-A-wave 19, V wave 19, mean 14 LVEDP-22 Cardiac output-15.4 L/min with an index of 7.76 L/min/m.    IMPRESSION: Mr. Sando has clean coronary arteries, mildly elevated LVEDP and no evidence of a  large V wave.  If he needs mitral valve replacement/repair his coronary anatomy is clean.  I did not place closure devices since the patient has potential SBE and sepsis I do not want to leave a foreign body in the patient's artery or vein.  The sheath will be removed and pressure held.  The patient left lab in stable condition.  Quay Burow. MD, [facility] 10/07/2020 10:10 AM      Recommendations  Antiplatelet/Anticoag No indication for antiplatelet therapy at this time .   Surgeon Notes    10/05/2020 10:19 AM CV Procedure  signed by Pixie Casino, MD    Indications  Subacute bacterial endocarditis [I33.0 (ICD-10-CM)]   Procedural Details  Technical Details PROCEDURE DESCRIPTION:   The patient was brought to the second floor [facility] cath lab in the postabsorptive state. He was premedicated with IV Versed and fentanyl. His right groinwas prepped and shaved in usual sterile fashion. Xylocaine 1% was used for local anesthesia. A 5 French sheath was inserted into the right common femoral artery using standard Seldinger technique.  A 7 French sheath was inserted into the right common femoral vein.  A 7 [place] balloontipped thermodilution Swan-Ganz catheter was advanced to the right heart chambers obtaining sequential pressures and pulmonary artery blood samples for the determination of Fick cardiac output.  5 French right left Judkins diagnostic catheters on 5 French pigtail catheter were used for selective coronary angiography and obtain left heart pressures.  Isovue dye was used for the entirety of the case.  Retroaortic, ventricular and pullback pressures were recorded. Estimated blood loss <50 mL.   During this procedure medications were administered to achieve and maintain moderate conscious sedation while the patient's heart rate, blood pressure, and oxygen saturation were continuously monitored and I was present face-to-face 100% of this time.   Medications (Filter: Administrations occurring from 0910 to 1009 on 10/07/20) (important) Continuous medications are totaled by the amount administered until 10/07/20 1009.    Heparin (Porcine) in NaCl 1000-0.9 UT/500ML-% SOLN (mL) Total volume:  1,000 mL  Date/Time Rate/Dose/Volume Action   10/07/20 0930 500 mL Given   0931 500 mL Given    0.9 % sodium chloride infusion (mL/hr) Total dose:  Cannot be calculated* Dosing weight:  77.4  *Continuous medication not stopped within the calculation time range. Date/Time Rate/Dose/Volume Action    10/07/20 0931 10 mL/hr New Bag/Given    fentaNYL (SUBLIMAZE) injection (mcg) Total dose:  50 mcg  Date/Time Rate/Dose/Volume Action   10/07/20 0938 25 mcg Given   0945 25 mcg Given    midazolam (VERSED) injection (mg) Total dose:  2 mg  Date/Time Rate/Dose/Volume Action   10/07/20 0939 1 mg Given   0945 1 mg Given    lidocaine (PF) (XYLOCAINE) 1 % injection (mL) Total volume:  15 mL  Date/Time Rate/Dose/Volume Action   10/07/20 0944 15 mL Given    albuterol (PROVENTIL) (2.5 MG/3ML) 0.083% nebulizer solution 2.5 mg (mg) Total dose:  Cannot be calculated* Dosing weight:  83.9  *Administration dose not documented Date/Time Rate/Dose/Volume Action   10/07/20 0925 *Not included in total MAR Hold    amLODipine (NORVASC) tablet 10 mg (mg) Total dose:  Cannot be calculated* Dosing weight:  80.1  *Administration dose not documented Date/Time Rate/Dose/Volume Action   10/07/20 0925 *Not included in total MAR Hold    calcium acetate (PHOSLO) capsule 1,334 mg (mg) Total dose:  Cannot be calculated* Dosing weight:  77.4  *Administration dose not documented Date/Time Rate/Dose/Volume  Action   10/07/20 0925 *Not included in total MAR Hold    calcium acetate (PHOSLO) capsule 667 mg (mg) Total dose:  Cannot be calculated* Dosing weight:  83.9  *Administration dose not documented Date/Time Rate/Dose/Volume Action   10/07/20 0925 *Not included in total MAR Hold   1000 *Not included in total Automatically Held    Chlorhexidine Gluconate Cloth 2 % PADS 6 each (each) Total dose:  Cannot be calculated* Dosing weight:  79  *Administration dose not documented Date/Time Rate/Dose/Volume Action   10/07/20 0925 *Not included in total MAR Hold    cinacalcet (SENSIPAR) tablet 30 mg (mg) Total dose:  Cannot be calculated*  *Administration dose not documented Date/Time Rate/Dose/Volume Action   10/07/20 0925 *Not included in total MAR Hold    Darbepoetin Alfa (ARANESP) injection  100 mcg (mcg) Total dose:  Cannot be calculated* Dosing weight:  79  *Administration dose not documented Date/Time Rate/Dose/Volume Action   10/07/20 0925 *Not included in total MAR Hold    doxercalciferol (HECTOROL) injection 3 mcg (mcg) Total dose:  Cannot be calculated* Dosing weight:  81  *Administration dose not documented Date/Time Rate/Dose/Volume Action   10/07/20 0925 *Not included in total MAR Hold    feeding supplement (NEPRO CARB STEADY) liquid 237 mL (mL) Total dose:  Cannot be calculated* Dosing weight:  79  *Administration dose not documented Date/Time Rate/Dose/Volume Action   10/07/20 0925 *Not included in total MAR Hold   1000 *Not included in total Automatically Held    hydrALAZINE (APRESOLINE) tablet 50 mg (mg) Total dose:  Cannot be calculated* Dosing weight:  80.1  *Administration dose not documented Date/Time Rate/Dose/Volume Action   10/07/20 0925 *Not included in total MAR Hold    HYDROmorphone (DILAUDID) injection 0.5 mg (mg) Total dose:  Cannot be calculated* Dosing weight:  84.3  *Administration dose not documented Date/Time Rate/Dose/Volume Action   10/07/20 0925 *Not included in total MAR Hold    metoprolol tartrate (LOPRESSOR) tablet 75 mg (mg) Total dose:  Cannot be calculated* Dosing weight:  84.3  *Administration dose not documented Date/Time Rate/Dose/Volume Action   10/07/20 0925 *Not included in total MAR Hold    multivitamin (RENA-VIT) tablet 1 tablet (tablet) Total dose:  Cannot be calculated* Dosing weight:  79  *Administration dose not documented Date/Time Rate/Dose/Volume Action   10/07/20 0925 *Not included in total MAR Hold    oxyCODONE (Oxy IR/ROXICODONE) immediate release tablet 5 mg (mg) Total dose:  Cannot be calculated* Dosing weight:  83.9  *Administration dose not documented Date/Time Rate/Dose/Volume Action   10/07/20 0925 *Not included in total MAR Hold    polyethylene glycol (MIRALAX / GLYCOLAX) packet 17 g  (g) Total dose:  Cannot be calculated* Dosing weight:  83.9  *Administration dose not documented Date/Time Rate/Dose/Volume Action   10/07/20 0925 *Not included in total MAR Hold    vancomycin (VANCOCIN) IVPB 750 mg/150 ml premix (mg) Total dose:  Cannot be calculated* Dosing weight:  79  *Administration dose not documented Date/Time Rate/Dose/Volume Action   10/07/20 0925 *Not included in total MAR Hold    Sedation Time  Sedation Time Physician-1: 19 minutes 20 seconds   Radiation/Fluoro  Fluoro time: 3.2 (min) DAP: 36329 (mGycm2) Cumulative Air Kerma: 427 (mGy)   Coronary Findings   Diagnostic Dominance: Right  No diagnostic findings have been documented.  Intervention   No interventions have been documented.  Coronary Diagrams   Diagnostic Dominance: Right    Intervention    Implants    No implant  documentation for this case.    Syngo Images  Show images for CARDIAC CATHETERIZATION  Images on Long Term Storage  Show images for Hermen[place]ildo, [place] to Procedure Log  Procedure Log     Hemo Data  Flowsheet Row Most Recent Value  Fick Cardiac Output 15.42 L/min  Fick Cardiac Output Index 7.76 (L/min)/BSA  RA A Wave 15 mmHg  RA V Wave 8 mmHg  RA Mean 7 mmHg  RV Systolic Pressure 43 mmHg  RV Diastolic Pressure 3 mmHg  RV EDP 10 mmHg  PA Systolic Pressure 37 mmHg  PA Diastolic Pressure 8 mmHg  PA Mean 23 mmHg  PW A Wave 19 mmHg  PW V Wave 19 mmHg  PW Mean 14 mmHg  AO Systolic Pressure 123456 mmHg  AO Diastolic Pressure 82 mmHg  AO Mean 99 mmHg  LV Systolic Pressure 123456 mmHg  LV Diastolic Pressure 8 mmHg  LV EDP 22 mmHg  AOp Systolic Pressure 123456 mmHg  AOp Diastolic Pressure 79 mmHg  AOp Mean Pressure 97 mmHg  LVp Systolic Pressure [id number] mmHg  LVp Diastolic Pressure 7 mmHg  LVp EDP Pressure 21 mmHg  QP/QS 1  TPVR Index 2.96 HRUI  TSVR Index 12.76 HRUI  PVR SVR Ratio 0.1  TPVR/TSVR Ratio 0.23     Assessment/Plan: S/P  Procedure(s) (LRB): LEFT HEART CATH AND CORONARY ANGIOGRAPHY (N/A)  Results of cath reviewed and discussed w/ patient. Will tentatively plan mitral valve repair/replacement next week   I spent in excess of 15 minutes during the conduct of this hospital encounter and >50% of this time involved direct face-to-face encounter with the patient for counseling and/or coordination of their care.    Rexene Alberts, MD 10/07/2020 3:08 PM

## 2020-10-07 NOTE — Interval H&P Note (Signed)
Cath Lab Visit (complete for each Cath Lab visit)  Clinical Evaluation Leading to the Procedure:   ACS: No.  Non-ACS:    Anginal Classification: No Symptoms  Anti-ischemic medical therapy: No Therapy  Non-Invasive Test Results: No non-invasive testing performed  Prior CABG: No previous CABG      History and Physical Interval Note:  10/07/2020 9:38 AM  Patrick Anthony  has presented today for surgery, with the diagnosis of chest pain.  The various methods of treatment have been discussed with the patient and family. After consideration of risks, benefits and other options for treatment, the patient has consented to  Procedure(s): LEFT HEART CATH AND CORONARY ANGIOGRAPHY (N/A) as a surgical intervention.  The patient's history has been reviewed, patient examined, no change in status, stable for surgery.  I have reviewed the patient's chart and labs.  Questions were answered to the patient's satisfaction.     Quay Burow

## 2020-10-07 NOTE — NC FL2 (Signed)
[place] LEVEL OF CARE SCREENING TOOL     IDENTIFICATION  Patient Name: Patrick Anthony Birthdate: [birth year] Sex: male Admission Date (Current Location): 09/27/2020  [facility] and [place] Number:  Herbalist and Address:  The . [facility], [place] [address] [place], [place] [address]      Provider Number: [id number]  Attending Physician Name and Address:  Caren Griffins, MD  Relative Name and Phone Number:  [name] [phone]    Current Level of Care: Hospital Recommended Level of Care: [place] Prior Approval Number:    Date Approved/Denied:   PASRR Number: [id number]  Discharge Plan: SNF    Current Diagnoses: Patient Active Problem List   Diagnosis Date Noted  . Mitral regurgitation   . Uncontrolled hypertension   . Patent foramen ovale   . Cerebral embolism with cerebral infarction 09/29/2020  . 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 Bacteremia due to Staphylococcus aureus   . Sepsis ([place]) 09/27/2020  . Pneumonia due to infectious agent 09/27/2020  . 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 Sepsis due to pneumonia ([place]) 09/27/2020  . Lactic acidosis 09/27/2020  . Bacterial endocarditis 09/27/2020  . Chest pain 05/03/2017  . End stage renal disease ([place]) 10/17/2013  . Anemia in chronic kidney disease 08/30/2013  . Hepatitis C 08/30/2013  . ESRD on dialysis ([place]) 08/30/2013  . Hypertensive crisis 08/28/2013  . Hypertensive emergency 04/20/2013  . Chronic combined systolic and diastolic congestive heart failure ([place]) 04/19/2013  . HTN (hypertension) 04/19/2013    Orientation RESPIRATION BLADDER Height & Weight     Self,Time,Situation,Place  Normal Continent Weight: 170 lb 10.2 oz (77.4 kg) Height:  6' (182.9 cm)  BEHAVIORAL SYMPTOMS/MOOD NEUROLOGICAL BOWEL NUTRITION STATUS      Continent Diet (See discharge summary)  AMBULATORY STATUS COMMUNICATION OF  NEEDS Skin   Limited Assist Verbally Skin abrasions (Abrasion,back, mid,lower,foam)                       Personal Care Assistance Level of Assistance  Bathing,Feeding,Dressing Bathing Assistance: Limited assistance Feeding assistance: Independent (able to feed self) Dressing Assistance: Limited assistance     Functional Limitations Info  Sight,Hearing,Speech Sight Info: Impaired Hearing Info: Adequate Speech Info: Adequate    SPECIAL CARE FACTORS FREQUENCY  PT (By licensed PT),OT (By licensed OT)     PT Frequency: 5x min weekly OT Frequency: 5x min weekly            Contractures Contractures Info: Not present    Additional Factors Info  Code Status Code Status Info: FULL             Current Medications (10/07/2020):  This is the current hospital active medication list Current Facility-Administered Medications  Medication Dose Route Frequency Provider Last Rate Last Admin  . 0.9 %  sodium chloride infusion   Intravenous Continuous Lorretta Harp, MD      . 0.9 %  sodium chloride infusion  250 mL Intravenous PRN Lorretta Harp, MD      . acetaminophen (TYLENOL) tablet 650 mg  650 mg Oral Q4H PRN Pixie Casino, MD   650 mg at 10/01/20 1643   Or  . acetaminophen (TYLENOL) 160 MG/5ML solution 650 mg  650 mg Per Tube Q4H PRN Pixie Casino, MD       Or  . acetaminophen (TYLENOL) suppository 650 mg  650 mg Rectal Q4H PRN Hilty,  Nadean Corwin, MD   650 mg at 09/28/20 2327  . acetaminophen (TYLENOL) tablet 650 mg  650 mg Oral Q4H PRN Lorretta Harp, MD      . albuterol (PROVENTIL) (2.5 MG/3ML) 0.083% nebulizer solution 2.5 mg  2.5 mg Nebulization Q2H PRN Oswald Hillock, MD      . amLODipine (NORVASC) tablet 10 mg  10 mg Oral Daily Pixie Casino, MD   10 mg at 10/07/20 [phone]  . calcium acetate (PHOSLO) capsule 1,334 mg  1,334 mg Oral TID WC Lorretta Harp, MD   1,334 mg at 10/07/20 1507  . calcium acetate (PHOSLO) capsule 667 mg  667 mg Oral With snacks  Oswald Hillock, MD   667 mg at 10/05/20 2058  . Chlorhexidine Gluconate Cloth 2 % PADS 6 each  6 each Topical Q0600 Lorretta Harp, MD   6 each at 10/07/20 [phone]  . cinacalcet (SENSIPAR) tablet 30 mg  30 mg Oral Q breakfast Oswald Hillock, MD   30 mg at 10/06/20 [id number]  . Darbepoetin Alfa (ARANESP) injection 100 mcg  100 mcg Intravenous Q Wed-HD Lorretta Harp, MD   100 mcg at 10/06/20 1623  . doxercalciferol (HECTOROL) injection 3 mcg  3 mcg Intravenous Q M,W,F-HD Pixie Casino, MD   3 mcg at 10/06/20 1621  . feeding supplement (NEPRO CARB STEADY) liquid 237 mL  237 mL Oral BID BM Lorretta Harp, MD   237 mL at 10/07/20 1509  . hydrALAZINE (APRESOLINE) injection 10 mg  10 mg Intravenous Q20 Min PRN Lorretta Harp, MD      . hydrALAZINE (APRESOLINE) tablet 50 mg  50 mg Oral BID Pixie Casino, MD   50 mg at 10/07/20 0842  . HYDROmorphone (DILAUDID) injection 0.5 mg  0.5 mg Intravenous Q4H PRN Pixie Casino, MD   0.5 mg at 09/30/20 2052  . labetalol (NORMODYNE) injection 10 mg  10 mg Intravenous Q10 min PRN Lorretta Harp, MD      . metoprolol tartrate (LOPRESSOR) tablet 75 mg  75 mg Oral BID Pixie Casino, MD   75 mg at 10/07/20 0842  . morphine 2 MG/ML injection 2 mg  2 mg Intravenous Q1H PRN Lorretta Harp, MD      . multivitamin (RENA-VIT) tablet 1 tablet  1 tablet Oral QHS Lorretta Harp, MD   1 tablet at 10/06/20 2049  . ondansetron (ZOFRAN) injection 4 mg  4 mg Intravenous Q6H PRN Lorretta Harp, MD      . ondansetron [facility]) tablet 4 mg  4 mg Oral Q6H PRN Oswald Hillock, MD   4 mg at 09/27/20 2104  . oxyCODONE (Oxy IR/ROXICODONE) immediate release tablet 5 mg  5 mg Oral Q4H PRN Oswald Hillock, MD   5 mg at 10/01/20 2117  . polyethylene glycol (MIRALAX / GLYCOLAX) packet 17 g  17 g Oral Daily PRN Oswald Hillock, MD      . sodium chloride flush (NS) 0.9 % injection 3 mL  3 mL Intravenous Q12H Lorretta Harp, MD      . sodium chloride flush (NS) 0.9 % injection 3  mL  3 mL Intravenous PRN Lorretta Harp, MD      . Derrill Memo ON 10/08/2020] vancomycin (VANCOCIN) IVPB 750 mg/150 ml premix  750 mg Intravenous Q M,W,F-HD Lorretta Harp, MD       Facility-Administered Medications Ordered in Other Encounters  Medication Dose Route Frequency  Provider Last Rate Last Admin  . regadenoson (LEXISCAN) injection SOLN 0.4 mg  0.4 mg Intravenous Once Cherlynn Kaiser A, MD      . technetium tetrofosmin (TC-MYOVIEW) injection [id number] millicurie  [id number] millicurie Intravenous Once PRN Cherlynn Kaiser A, MD      . technetium tetrofosmin (TC-MYOVIEW) injection [id number] millicurie  [id number] millicurie Intravenous Once PRN Elouise [place], MD         Discharge Medications: Please see discharge summary for a list of discharge medications.  Relevant Imaging Results:  Relevant Lab Results:   Additional Information [id number]  Trula Ore, LCSWA

## 2020-10-07 NOTE — Progress Notes (Signed)
PROGRESS NOTE  Patrick Anthony [id number] DOB: [birth year] DOA: 09/27/2020 PCP: [name], MD   LOS: 10 days   Brief Narrative / Interim history: 50 year old male with medical history of end-stage renal disease due to APKD on hemodialysis MWF through left upper extremity AV graft, hypertension, OSA, chronic diastolic CHF, EF [id number], liver cirrhosis due to hepatitis C, history of alcohol abuse came to hospital with generalized weakness.  He was admitted with sepsis due to suspected pneumonia and started on antibiotics.  Code stroke was called on hospital day 1 and imaging showed embolic strokes.  His blood cultures were positive for staph aureus and he's had TEE on 3/1 which is concerning for mitral valve endocarditis.  Currently on vancomycin.  ID, neurology, cardiology, and CT surgery are following.     Subjective / 24h Interval events: He is wondering why he needs cardiac catheterization.  Initially refused it this morning  Assessment & Plan: Principal Problem Mitral valve endocarditis, MRSA bacteremia-ID is following, patient has been started on vancomycin.  Surveillance blood cultures obtained on 2/27 after initiation of antibiotics remain negative.  CT scan of the abdomen and pelvis on 2/23 with cavitary mass in the left upper lobe, possibly septic embolus, as well as small foci of consolidation and groundglass density in the right upper lobe and small groundglass nodule in the left upper lobe (infectious versus inflammatory), CT also with polycystic kidney disease.  -Cardiothoracic surgery consulting, he is a surgical candidate, will get a cardiac cath today.  Initially refusing prep discussed with the patient that this is necessary in preparation for surgery.  He is now agreeable.  Active Problems New onset aphasia, scattered small acute infarcts in the bilateral cerebral hemispheres, right basal ganglia, callosal splenium, bilateral cerebral hemispheres concerning for embolic  process -likely due to endocarditis.  2D echo showed EF 55-60%, grade 2 DD.  An MRI of the head and neck without appreciable stenosis.  Acute metabolic encephalopathy-improving  Seizure-like activity upper extremities-myoclonus suspected, hold gabapentin.  EEG negative  ESRD due to adult polycystic kidney disease-continue dialysis per nephrology  Iron deficiency anemia, anemia of chronic disease-monitor hemoglobin  Essential hypertension-continue regimen as below  Liver cirrhosis, history of hep C-monitor  Scheduled Meds: . [MAR Hold] amLODipine  10 mg Oral Daily  . [MAR Hold] calcium acetate  1,334 mg Oral TID WC  . [MAR Hold] calcium acetate  667 mg Oral With snacks  . [MAR Hold] Chlorhexidine Gluconate Cloth  6 each Topical Q0600  . [MAR Hold] cinacalcet  30 mg Oral Q breakfast  . [MAR Hold] darbepoetin (ARANESP) injection - DIALYSIS  100 mcg Intravenous Q Wed-HD  . [MAR Hold] doxercalciferol  3 mcg Intravenous Q M,W,F-HD  . [MAR Hold] feeding supplement (NEPRO CARB STEADY)  237 mL Oral BID BM  . [MAR Hold] hydrALAZINE  50 mg Oral BID  . [MAR Hold] metoprolol tartrate  75 mg Oral BID  . [MAR Hold] multivitamin  1 tablet Oral QHS  . sodium chloride flush  3 mL Intravenous Q12H   Continuous Infusions: . sodium chloride    . sodium chloride 10 mL/hr at 10/07/20 [id number]  . sodium chloride 10 mL/hr (10/07/20 0931)  . [MAR Hold] vancomycin     PRN Meds:.sodium chloride, sodium chloride, [MAR Hold] acetaminophen **OR** [MAR Hold] acetaminophen (TYLENOL) oral liquid 160 mg/5 mL **OR** [MAR Hold] acetaminophen, [MAR Hold] albuterol, fentaNYL, Heparin (Porcine) in NaCl, [MAR Hold]  HYDROmorphone (DILAUDID) injection, lidocaine (PF), midazolam, [MAR Hold] ondansetron **OR** [MAR Hold]  ondansetron (ZOFRAN) IV, [MAR Hold] oxyCODONE, [MAR Hold] polyethylene glycol, sodium chloride flush  Diet Orders (From admission, onward)    Start     Ordered   10/07/20 0451  Diet NPO time specified Except  for: Sips with Meds  Diet effective midnight       Comments: NPO for solid foods after midnight, may have clear liquids until 5am, then NPO (this would be for inpatients and outpatients)  Question:  Except for  Answer:  Sips with Meds   10/07/20 0451          DVT prophylaxis: SCD's Start: 09/28/20 1041     Code Status: Full Code  Family Communication: no family at bedside  Status is: Inpatient  Remains inpatient appropriate because:Inpatient level of care appropriate due to severity of illness   Dispo: The patient is from: Home              Anticipated d/c is to: SNF              Patient currently is not medically stable to d/c.   Difficult to place patient No    Level of care: Telemetry Medical  Consultants:  Neurology ID Cardiology Cardiothoracic surgery  Procedures:  2D echo, TEE, cardiac cath  Microbiology  Blood cultures MRSA Surveillance 2/27 - negative  Antimicrobials: Vancomycin     Objective: Vitals:   10/07/20 0127 10/07/20 0431 10/07/20 0753 10/07/20 0930  BP:  91/71 109/76   Pulse:  78 78   Resp:  19 18   Temp:  98.9 F (37.2 C) 98.4 F (36.9 C)   TempSrc:  Axillary Oral   SpO2:  98% 98% 100%  Weight: 77.4 kg     Height:        Intake/Output Summary (Last 24 hours) at 10/07/2020 1000 Last data filed at 10/07/2020 [id number] Gross per 24 hour  Intake 11.32 ml  Output 2000 ml  Net -1988.68 ml   Filed Weights   10/06/20 1528 10/06/20 1837 10/07/20 0127  Weight: 78.8 kg 76.5 kg 77.4 kg    Examination:  Constitutional: No distress Eyes: No icterus ENMT: mmm Neck: normal, supple Respiratory: Clear bilaterally, no wheezing or crackles Cardiovascular: Regular rate and rhythm, no murmurs, no edema Abdomen: Soft, NT, ND, bowel sounds positive Musculoskeletal: no clubbing / cyanosis.  Skin: No rashes seen Neurologic: No focal deficits  Data Reviewed: I have independently reviewed following labs and imaging studies   CBC: Recent Labs   Lab 10/27/2020 0818 10/02/20 0315 10/03/20 0242 10/04/20 0211 10/05/20 0431 10/06/20 1529 10/07/20 0401  WBC 38.5* 32.2* 31.1* 26.0* 20.6* 18.7* 19.1*  NEUTROABS 31.6* 29.3* 28.0* 23.9* 15.7*  --   --   HGB 9.1* 9.5* 8.6* 8.7* 8.7* 8.1* 8.4*  HCT 27.5* 27.6* 26.5* 25.3* 26.1* 24.5* 25.4*  MCV 80.6 80.9 81.8 80.3 82.6 83.3 83.6  PLT 191 185 294 217 230 221 [id number]   Basic Metabolic Panel: Recent Labs  Lab 27-Oct-2020 0818 10/02/20 0315 10/03/20 0242 10/04/20 0211 10/05/20 0431 10/06/20 1529 10/07/20 0401  NA 138 135 133* 132* 134* 133* 133*  K 4.6 4.1 3.9 4.2 4.0 3.9 4.2  CL 96* 96* 91* 91* 94* 93* 94*  CO2 21* '23 23 23 25 22 23  '$ GLUCOSE 94 115* 123* 119* 115* 109* 102*  BUN 103* 54* 71* 93* 53* 77* 34*  CREATININE 11.05* 7.00* 8.93* 10.74* 7.85* 10.60* 6.39*  CALCIUM 8.5* 8.6* 8.2* 8.0* 7.6* 7.3* 7.9*  MG 3.1* 2.5* 2.6* 2.8* 2.5*  --   --  PHOS 7.5* 5.3* 6.0* 7.0* 6.0* 8.3*  --    Liver Function Tests: Recent Labs  Lab 10/02/20 0315 10/03/20 0242 10/04/20 0211 10/05/20 0431 10/06/20 1529 10/07/20 0401  AST '25 19 22 25  '$ --  33  ALT '13 11 12 13  '$ --  16  ALKPHOS 78 75 75 70  --  57  BILITOT 1.7* 1.1 1.2 1.1  --  0.8  PROT 7.8 7.3 7.4 7.6  --  7.8  ALBUMIN 2.3* 2.2* 2.2* 2.2* 2.1* 2.2*   Coagulation Profile: Recent Labs  Lab 10/01/20 0818 10/02/20 0315 10/04/20 0211  INR 1.4* 1.3* 1.1   HbA1C: No results for input(s): HGBA1C in the last 72 hours. CBG: Recent Labs  Lab 10/02/20 1124  GLUCAP 122*    Recent Results (from the past 240 hour(s))  Blood Culture (routine x 2)     Status: Abnormal   Collection Time: 09/27/20  2:16 PM   Specimen: BLOOD RIGHT HAND  Result Value Ref Range Status   Specimen Description BLOOD RIGHT HAND  Final   Special Requests   Final    BOTTLES DRAWN AEROBIC AND ANAEROBIC Blood Culture results may not be optimal due to an inadequate volume of blood received in culture bottles Performed at [facility] [address], [place], [place] [address]    Culture  Setup Time   Final    GRAM POSITIVE COCCI IN CLUSTERS IN BOTH AEROBIC AND ANAEROBIC BOTTLES    Culture METHICILLIN RESISTANT STAPHYLOCOCCUS AUREUS (A)  Final   Report Status 09/30/2020 FINAL  Final   Organism ID, Bacteria METHICILLIN RESISTANT STAPHYLOCOCCUS AUREUS  Final      Susceptibility   Methicillin resistant staphylococcus aureus - MIC*    CIPROFLOXACIN >=8 RESISTANT Resistant     ERYTHROMYCIN >=8 RESISTANT Resistant     GENTAMICIN <=0.5 SENSITIVE Sensitive     OXACILLIN >=4 RESISTANT Resistant     TETRACYCLINE <=1 SENSITIVE Sensitive     VANCOMYCIN <=0.5 SENSITIVE Sensitive     TRIMETH/SULFA <=10 SENSITIVE Sensitive     CLINDAMYCIN <=0.25 SENSITIVE Sensitive     RIFAMPIN <=0.5 SENSITIVE Sensitive     Inducible Clindamycin NEGATIVE Sensitive     * METHICILLIN RESISTANT STAPHYLOCOCCUS AUREUS  Resp Panel by RT-PCR (Flu A&B, Covid) Nasopharyngeal Swab     Status: None   Collection Time: 09/27/20  2:16 PM   Specimen: Nasopharyngeal Swab; Nasopharyngeal(NP) swabs in vial transport medium  Result Value Ref Range Status   SARS Coronavirus 2 by RT PCR NEGATIVE NEGATIVE Final    Comment: (NOTE) SARS-CoV-2 target nucleic acids are NOT DETECTED.  The SARS-CoV-2 RNA is generally detectable in upper respiratory specimens during the acute phase of infection. The lowest concentration of SARS-CoV-2 viral copies this assay can detect is 138 copies/mL. A negative result does not preclude SARS-Cov-2 infection and should not be used as the sole basis for treatment or other patient management decisions. A negative result may occur with  improper specimen collection/handling, submission of specimen other than nasopharyngeal swab, presence of viral mutation(s) within the areas targeted by this assay, and inadequate number of viral copies(<138 copies/mL). A negative result must be combined with clinical observations, patient history, and  epidemiological information. The expected result is Negative.  Fact Sheet for Patients:  EntrepreneurPulse.com.au  Fact Sheet for Healthcare Providers:  IncredibleEmployment.be  This test is no t yet approved or cleared by the [place] FDA and  has been authorized for detection and/or diagnosis of SARS-CoV-2  by FDA under an Emergency Use Authorization (EUA). This EUA will remain  in effect (meaning this test can be used) for the duration of the COVID-19 declaration under Section 564(b)(1) of the Act, 21 U.S.C.section 360bbb-3(b)(1), unless the authorization is terminated  or revoked sooner.       Influenza A by PCR NEGATIVE NEGATIVE Final   Influenza B by PCR NEGATIVE NEGATIVE Final    Comment: (NOTE) The Xpert Xpress SARS-CoV-2/FLU/RSV plus assay is intended as an aid in the diagnosis of influenza from Nasopharyngeal swab specimens and should not be used as a sole basis for treatment. Nasal washings and aspirates are unacceptable for Xpert Xpress SARS-CoV-2/FLU/RSV testing.  Fact Sheet for Patients: EntrepreneurPulse.com.au  Fact Sheet for Healthcare Providers: IncredibleEmployment.be  This test is not yet approved or cleared by the [place] FDA and has been authorized for detection and/or diagnosis of SARS-CoV-2 by FDA under an Emergency Use Authorization (EUA). This EUA will remain in effect (meaning this test can be used) for the duration of the COVID-19 declaration under Section 564(b)(1) of the Act, 21 U.S.C. section 360bbb-3(b)(1), unless the authorization is terminated or revoked.  Performed at [facility], [place] [address], [place], [place] [address]   Blood Culture ID Panel (Reflexed)     Status: Abnormal   Collection Time: 09/27/20  2:16 PM  Result Value Ref Range Status   Enterococcus faecalis NOT DETECTED NOT DETECTED Final   Enterococcus Faecium NOT DETECTED NOT DETECTED  Final   Listeria monocytogenes NOT DETECTED NOT DETECTED Final   Staphylococcus species DETECTED (A) NOT DETECTED Final    Comment: CRITICAL RESULT CALLED TO, READ BACK BY AND VERIFIED WITH: C,PIERCE PHARMD '@1009'$  09/28/20 EB    Staphylococcus aureus (BCID) DETECTED (A) NOT DETECTED Final    Comment: Methicillin (oxacillin)-resistant Staphylococcus aureus (MRSA). MRSA is predictably resistant to beta-lactam antibiotics (except ceftaroline). Preferred therapy is vancomycin unless clinically contraindicated. Patient requires contact precautions if  hospitalized. CRITICAL RESULT CALLED TO, READ BACK BY AND VERIFIED WITH: C,PIERCE PHARMD '@1009'$  09/28/20 EB    Staphylococcus epidermidis NOT DETECTED NOT DETECTED Final   Staphylococcus lugdunensis NOT DETECTED NOT DETECTED Final   Streptococcus species NOT DETECTED NOT DETECTED Final   Streptococcus agalactiae NOT DETECTED NOT DETECTED Final   Streptococcus pneumoniae NOT DETECTED NOT DETECTED Final   Streptococcus pyogenes NOT DETECTED NOT DETECTED Final   A.calcoaceticus-baumannii NOT DETECTED NOT DETECTED Final   Bacteroides fragilis NOT DETECTED NOT DETECTED Final   Enterobacterales NOT DETECTED NOT DETECTED Final   Enterobacter cloacae complex NOT DETECTED NOT DETECTED Final   Escherichia coli NOT DETECTED NOT DETECTED Final   Klebsiella aerogenes NOT DETECTED NOT DETECTED Final   Klebsiella oxytoca NOT DETECTED NOT DETECTED Final   Klebsiella pneumoniae NOT DETECTED NOT DETECTED Final   Proteus species NOT DETECTED NOT DETECTED Final   Salmonella species NOT DETECTED NOT DETECTED Final   Serratia marcescens NOT DETECTED NOT DETECTED Final   Haemophilus influenzae NOT DETECTED NOT DETECTED Final   Neisseria meningitidis NOT DETECTED NOT DETECTED Final   Pseudomonas aeruginosa NOT DETECTED NOT DETECTED Final   Stenotrophomonas maltophilia NOT DETECTED NOT DETECTED Final   Candida albicans NOT DETECTED NOT DETECTED Final   Candida auris  NOT DETECTED NOT DETECTED Final   Candida glabrata NOT DETECTED NOT DETECTED Final   Candida krusei NOT DETECTED NOT DETECTED Final   Candida parapsilosis NOT DETECTED NOT DETECTED Final   Candida tropicalis NOT DETECTED NOT DETECTED Final   Cryptococcus neoformans/gattii NOT  DETECTED NOT DETECTED Final   Meth resistant mecA/C and MREJ DETECTED (A) NOT DETECTED Final    Comment: CRITICAL RESULT CALLED TO, READ BACK BY AND VERIFIED WITH: C,PIERCE PHARMD '@1009'$  09/28/20 EB Performed at [facility], [address] [place], [place] [address]   Blood Culture (routine x 2)     Status: Abnormal   Collection Time: 09/27/20  2:29 PM   Specimen: BLOOD  Result Value Ref Range Status   Specimen Description BLOOD SITE NOT SPECIFIED  Final   Special Requests   Final    BOTTLES DRAWN AEROBIC AND ANAEROBIC Blood Culture results may not be optimal due to an inadequate volume of blood received in culture bottles   Culture  Setup Time   Final    GRAM POSITIVE COCCI IN CLUSTERS ANAEROBIC BOTTLE ONLY CRITICAL VALUE NOTED.  VALUE IS CONSISTENT WITH PREVIOUSLY REPORTED AND CALLED VALUE.    Culture (A)  Final    STAPHYLOCOCCUS AUREUS SUSCEPTIBILITIES PERFORMED ON PREVIOUS CULTURE WITHIN THE LAST 5 DAYS. Performed at [facility], [place] [address] [place], [place] [address]    Report Status 10/01/2020 FINAL  Final  Culture, blood (routine x 2)     Status: None   Collection Time: 09/30/20  6:04 AM   Specimen: BLOOD RIGHT FOREARM  Result Value Ref Range Status   Specimen Description BLOOD RIGHT FOREARM  Final   Special Requests   Final    BOTTLES DRAWN AEROBIC AND ANAEROBIC Blood Culture adequate volume   Culture   Final    NO GROWTH 5 DAYS Performed at [facility], [place] [address], [place], [place] [address]    Report Status 10/05/2020 FINAL  Final  Culture, blood (routine x 2)     Status: Abnormal   Collection Time: 09/30/20  6:08 AM   Specimen: BLOOD RIGHT HAND  Result Value Ref  Range Status   Specimen Description BLOOD RIGHT HAND  Final   Special Requests   Final    BOTTLES DRAWN AEROBIC AND ANAEROBIC Blood Culture results may not be optimal due to an inadequate volume of blood received in culture bottles   Culture  Setup Time   Final    GRAM POSITIVE COCCI IN CLUSTERS ANAEROBIC BOTTLE ONLY CRITICAL VALUE NOTED.  VALUE IS CONSISTENT WITH PREVIOUSLY REPORTED AND CALLED VALUE.    Culture (A)  Final    STAPHYLOCOCCUS AUREUS SUSCEPTIBILITIES PERFORMED ON PREVIOUS CULTURE WITHIN THE LAST 5 DAYS. Performed at [facility], [place] [address], [place], [place] [address]    Report Status 10/03/2020 FINAL  Final  Culture, blood (routine x 2)     Status: None (Preliminary result)   Collection Time: 10/03/20  2:42 AM   Specimen: BLOOD  Result Value Ref Range Status   Specimen Description BLOOD LEFT ARM  Final   Special Requests   Final    BOTTLES DRAWN AEROBIC AND ANAEROBIC Blood Culture results may not be optimal due to an inadequate volume of blood received in culture bottles   Culture   Final    NO GROWTH 3 DAYS Performed at [facility], [place] [address], [place], [place] [address]    Report Status PENDING  Incomplete  Culture, blood (routine x 2)     Status: None (Preliminary result)   Collection Time: 10/03/20  2:42 AM   Specimen: BLOOD  Result Value Ref Range Status   Specimen Description BLOOD LEFT HAND  Final   Special Requests  Final    BOTTLES DRAWN AEROBIC ONLY Blood Culture results may not be optimal due to an inadequate volume of blood received in culture bottles   Culture   Final    NO GROWTH 3 DAYS Performed at [facility], [place] [address] [place], [place] [address]    Report Status PENDING  Incomplete     Radiology Studies: DG Orthopantogram  Result Date: 10/06/2020 CLINICAL DATA:  Preop evaluation EXAM: ORTHOPANTOGRAM/PANORAMIC COMPARISON:  None. FINDINGS: Panoramic view of the mandible reveals no acute fracture. Lucency is  seen surrounding the root of the left maxillary canine tooth with extensive erosive changes consistent with dental caries. First maxillary bicuspid on the left also demonstrates extensive dental caries. Erosive changes of the lateral incisors in the mandible are noted as well consistent with dental caries. IMPRESSION: Multifocal dental caries. Periapical lucency involving the left maxillary canine tooth. Electronically Signed   By: Inez Catalina M.D.   On: 10/06/2020 16:00   DG Chest 2 View  Result Date: 10/06/2020 CLINICAL DATA:  Left upper lobe lung mass EXAM: CHEST - 2 VIEW COMPARISON:  09/29/2020 FINDINGS: Cardiac shadow is enlarged. The lungs are well aerated bilaterally. Known cavitary left upper lobe mass is obscured by overlying EKG lead. Vascular stent is noted on the left. Calcified soft tissue mass is noted about the right shoulder stable from prior CT. IMPRESSION: Left upper lobe mass lesion is not well appreciated on this exam. Chronic changes as described above. Electronically Signed   By: Inez Catalina M.D.   On: 10/06/2020 16:02     Marzetta Board, MD, PhD [facility]  Between 7 am - 7 pm I am available, please contact me via Amion or Securechat  Between 7 pm - 7 am I am not available, please contact night coverage MD/APP via Amion

## 2020-10-07 NOTE — Progress Notes (Signed)
Nutrition Follow-up  DOCUMENTATION CODES:   Not applicable  INTERVENTION:   -Snacks BID  -continue Nepro Carb Steady BID, each supplement provides 420 kcal, 19 grams protein  -continue Rena-Vit po daily  -provided diet education on increased protein needs and phosphorus sources  NUTRITION DIAGNOSIS:   Increased nutrient needs related to chronic illness (ESRD on HD) as evidenced by estimated needs.  Ongoing.  GOAL:   Patient will meet greater than or equal to 90% of their needs  Progressing.  MONITOR:   PO intake,Supplement acceptance,Skin,I & O's,Diet advancement,Labs,Weight trends  REASON FOR ASSESSMENT:   Consult Diet education,Assessment of nutrition requirement/status,Malnutrition Eval  ASSESSMENT:   63 YOM admitted for pneumonia d/t infectious agent. PMH of cirrhosis of liver, drug abuse, ETOH abuse, anemia, CHF, Hepatitis C, HTN, ESRD on HD. Recent onset of sepsis d/t MRSA, acute CVA.  3/01 - TEE w/o cardioversion, bubble study  3/02 - HD 3/03 - heart cath procedure today  Per chart meal documentation, pt has been consuming 15-100% of meals, however no documentation since 2/28. Pt reports to intern that he has had a poor appetite for the past few days. Pt reports that he is eating approximately 1/3 of his trays. Pt reports that he is enjoying his Nepro supplements, however is only drinking 1 supplement per day. Intern encouraged pt to consume the 2 supplements that are being sent to increase his protein and calories. Intern provided pt with All About Protein and Making Sense of Phosphorus handouts. Intern discussed with pt the importance of increased protein to aid in his healing as well as keeping his phosphorus levels within the normal range. Pt understood dietary sources of phosphorus and does currently take a phosphorus binder with meals and snacks.  Per chart and pt, EDW is 82.5 kg. Noted current weight of 77.4 kg is less than reported EDW. Pt's recorded  weights show a concerning decrease since admission.  Admit Weight: 185# Current Weight: 170.64# Intern performed NFPE on pt again and noted generalized edema as well as moderate depletion in lower body with mild depletion in dorsal hand.   Meds reviewed: Phoslo (with meals/snacks), Sensipar (daily), Hectoral (MWF)   Labs reviewed: Sodium (133, low), Corrected Calcium (9.34), Phosphorus (8.3, high)   Flowsheet Row Most Recent Value  Orbital Region No depletion  Upper Arm Region No depletion  Thoracic and Lumbar Region No depletion  Buccal Region No depletion  Temple Region No depletion  Clavicle Bone Region No depletion  Clavicle and Acromion Bone Region No depletion  Scapular Bone Region No depletion  Dorsal Hand Mild depletion  Patellar Region Moderate depletion  Anterior Thigh Region Moderate depletion  Posterior Calf Region Moderate depletion  Edema (RD Assessment) Mild  [generalized edema]  Hair Reviewed  Eyes Reviewed  Mouth Reviewed  [poor dentition]  Skin Reviewed  Nails Reviewed       Diet Order:   Diet Order            Diet NPO time specified Except for: Sips with Meds  Diet effective midnight                 EDUCATION NEEDS:   Education needs have been addressed (RD discussed with pt protein's importance and protein sources)  Skin:  Skin Assessment: Reviewed RN Assessment  Last BM:  3/01  Height:   Ht Readings from Last 1 Encounters:  10/05/20 6' (1.829 m)    Weight:   Wt Readings from Last 1 Encounters:  10/07/20 77.4 kg  Ideal Body Weight:  80.9 kg  BMI:  Body mass index is 23.14 kg/m.  Estimated Nutritional Needs:   Kcal:  2200-2400  Protein:  105-120 g  Fluid:  1.2 L    Salvadore Oxford, Dietetic Intern 10/07/2020 2:45 PM

## 2020-10-07 NOTE — Progress Notes (Signed)
SLP Cancellation Note  Patient Details Name: Patrick Anthony MRN: [id number] DOB: [birth year]   Cancelled treatment:       Reason Eval/Treat Not Completed: Patient at procedure or test/unavailable   Elvina Sidle, M.S., CCC-SLP 10/07/2020, 9:30 AM

## 2020-10-08 ENCOUNTER — Other Ambulatory Visit (HOSPITAL_COMMUNITY): Payer: Medicaid Other

## 2020-10-08 ENCOUNTER — Inpatient Hospital Stay (HOSPITAL_COMMUNITY): Payer: Medicaid Other

## 2020-10-08 DIAGNOSIS — Z01810 Encounter for preprocedural cardiovascular examination: Secondary | ICD-10-CM | POA: Diagnosis not present

## 2020-10-08 DIAGNOSIS — B9561 Methicillin susceptible Staphylococcus aureus infection as the cause of diseases classified elsewhere: Secondary | ICD-10-CM | POA: Diagnosis not present

## 2020-10-08 DIAGNOSIS — K0530 Chronic periodontitis, unspecified: Secondary | ICD-10-CM | POA: Diagnosis present

## 2020-10-08 DIAGNOSIS — K045 Chronic apical periodontitis: Secondary | ICD-10-CM | POA: Diagnosis present

## 2020-10-08 DIAGNOSIS — N186 End stage renal disease: Secondary | ICD-10-CM | POA: Diagnosis not present

## 2020-10-08 DIAGNOSIS — J984 Other disorders of lung: Secondary | ICD-10-CM | POA: Diagnosis not present

## 2020-10-08 DIAGNOSIS — I330 Acute and subacute infective endocarditis: Secondary | ICD-10-CM | POA: Diagnosis not present

## 2020-10-08 DIAGNOSIS — K029 Dental caries, unspecified: Secondary | ICD-10-CM | POA: Diagnosis present

## 2020-10-08 DIAGNOSIS — K036 Deposits [accretions] on teeth: Secondary | ICD-10-CM | POA: Diagnosis not present

## 2020-10-08 DIAGNOSIS — Z992 Dependence on renal dialysis: Secondary | ICD-10-CM | POA: Diagnosis not present

## 2020-10-08 DIAGNOSIS — I6310 Cerebral infarction due to embolism of unspecified precerebral artery: Secondary | ICD-10-CM | POA: Diagnosis not present

## 2020-10-08 DIAGNOSIS — Q612 Polycystic kidney, adult type: Secondary | ICD-10-CM | POA: Diagnosis not present

## 2020-10-08 DIAGNOSIS — D631 Anemia in chronic kidney disease: Secondary | ICD-10-CM | POA: Diagnosis not present

## 2020-10-08 DIAGNOSIS — R7881 Bacteremia: Secondary | ICD-10-CM | POA: Diagnosis not present

## 2020-10-08 LAB — CBC
HCT: 29.7 % — ABNORMAL LOW (ref 39.0–52.0)
Hemoglobin: 9.3 g/dL — ABNORMAL LOW (ref 13.0–17.0)
MCH: 26.8 pg (ref 26.0–34.0)
MCHC: 31.3 g/dL (ref 30.0–36.0)
MCV: 85.6 fL (ref 80.0–100.0)
Platelets: 295 10*3/uL (ref 150–400)
RBC: 3.47 MIL/uL — ABNORMAL LOW (ref 4.22–5.81)
RDW: 17.5 % — ABNORMAL HIGH (ref 11.5–15.5)
WBC: 19.2 10*3/uL — ABNORMAL HIGH (ref 4.0–10.5)
nRBC: 0 % (ref 0.0–0.2)

## 2020-10-08 LAB — CULTURE, BLOOD (ROUTINE X 2)
Culture: NO GROWTH
Culture: NO GROWTH

## 2020-10-08 LAB — BASIC METABOLIC PANEL
Anion gap: 14 (ref 5–15)
BUN: 43 mg/dL — ABNORMAL HIGH (ref 6–20)
CO2: 25 mmol/L (ref 22–32)
Calcium: 8.5 mg/dL — ABNORMAL LOW (ref 8.9–10.3)
Chloride: 93 mmol/L — ABNORMAL LOW (ref 98–111)
Creatinine, Ser: 8.59 mg/dL — ABNORMAL HIGH (ref 0.61–1.24)
GFR, Estimated: 7 mL/min — ABNORMAL LOW (ref >60)
Glucose, Bld: 114 mg/dL — ABNORMAL HIGH (ref 70–99)
Potassium: 4.6 mmol/L (ref 3.5–5.1)
Sodium: 132 mmol/L — ABNORMAL LOW (ref 135–145)

## 2020-10-08 MED ORDER — CHLORHEXIDINE GLUCONATE CLOTH 2 % EX PADS
6.0000 | MEDICATED_PAD | Freq: Every day | CUTANEOUS | Status: DC
  Administered 2020-10-12: 6 via TOPICAL

## 2020-10-08 MED ORDER — HEPARIN SODIUM (PORCINE) 1000 UNIT/ML IJ SOLN
INTRAMUSCULAR | Status: AC
  Filled 2020-10-08: qty 2

## 2020-10-08 MED ORDER — HEPARIN SODIUM (PORCINE) 1000 UNIT/ML DIALYSIS
20.0000 [IU]/kg | INTRAMUSCULAR | Status: DC | PRN
  Administered 2020-10-08: 1500 [IU] via INTRAVENOUS_CENTRAL
  Filled 2020-10-08 (×2): qty 2

## 2020-10-08 MED ORDER — DOXERCALCIFEROL 4 MCG/2ML IV SOLN
INTRAVENOUS | Status: AC
  Filled 2020-10-08: qty 2

## 2020-10-08 NOTE — Progress Notes (Signed)
Cardiology Progress Note  Patient ID: Yareth Bautista MRN: [id number] DOB: [birth year] Date of Encounter: 10/08/2020  Primary Cardiologist: Fransico Him, MD  Subjective   Chief Complaint: None.  HPI: Right femoral cath site clean and dry without hematoma or bruit.  Plans for mitral valve surgery next week.  No significant CAD found on left heart cath.  ROS:  All other ROS reviewed and negative. Pertinent positives noted in the HPI.     Inpatient Medications  Scheduled Meds: . amLODipine  10 mg Oral Daily  . calcium acetate  1,334 mg Oral TID WC  . calcium acetate  667 mg Oral With snacks  . Chlorhexidine Gluconate Cloth  6 each Topical Q0600  . cinacalcet  30 mg Oral Q breakfast  . darbepoetin (ARANESP) injection - DIALYSIS  100 mcg Intravenous Q Wed-HD  . doxercalciferol  3 mcg Intravenous Q M,W,F-HD  . feeding supplement (NEPRO CARB STEADY)  237 mL Oral BID BM  . hydrALAZINE  50 mg Oral BID  . metoprolol tartrate  75 mg Oral BID  . multivitamin  1 tablet Oral QHS  . sodium chloride flush  3 mL Intravenous Q12H   Continuous Infusions: . sodium chloride    . vancomycin     PRN Meds: sodium chloride, acetaminophen **OR** acetaminophen (TYLENOL) oral liquid 160 mg/5 mL **OR** acetaminophen, acetaminophen, albuterol, HYDROmorphone (DILAUDID) injection, morphine injection, ondansetron (ZOFRAN) IV, ondansetron **OR** [DISCONTINUED] ondansetron (ZOFRAN) IV, oxyCODONE, polyethylene glycol, sodium chloride flush   Vital Signs   Vitals:   10/07/20 2216 10/07/20 2218 10/07/20 2320 10/08/20 0320  BP: 118/74 118/74 110/82 (!) 134/96  Pulse:  85 93 80  Resp:   20 (!) 23  Temp:   98 F (36.7 C) 98.6 F (37 C)  TempSrc:   Oral Oral  SpO2:   96% 96%  Weight:    77.1 kg  Height:        Intake/Output Summary (Last 24 hours) at 10/08/2020 0950 Last data filed at 10/08/2020 0100 Gross per 24 hour  Intake 480 ml  Output --  Net 480 ml   Last 3 Weights 10/08/2020 10/07/2020 10/06/2020   Weight (lbs) 169 lb 14.4 oz 170 lb 10.2 oz 168 lb 10.4 oz  Weight (kg) 77.066 kg 77.4 kg 76.5 kg      Telemetry  Overnight telemetry shows normal sinus rhythm with heart rate in the 70s, which I personally reviewed.   ECG  The most recent ECG shows sinus tachycardia heart rate 127, LVH noted, which I personally reviewed.   Physical Exam   Vitals:   10/07/20 2216 10/07/20 2218 10/07/20 2320 10/08/20 0320  BP: 118/74 118/74 110/82 (!) 134/96  Pulse:  85 93 80  Resp:   20 (!) 23  Temp:   98 F (36.7 C) 98.6 F (37 C)  TempSrc:   Oral Oral  SpO2:   96% 96%  Weight:    77.1 kg  Height:         Intake/Output Summary (Last 24 hours) at 10/08/2020 0950 Last data filed at 10/08/2020 0100 Gross per 24 hour  Intake 480 ml  Output --  Net 480 ml    Last 3 Weights 10/08/2020 10/07/2020 10/06/2020  Weight (lbs) 169 lb 14.4 oz 170 lb 10.2 oz 168 lb 10.4 oz  Weight (kg) 77.066 kg 77.4 kg 76.5 kg    Body mass index is 23.04 kg/m.  General: Well nourished, well developed, in no acute distress Head: Atraumatic, normal size  Eyes: PEERLA,  EOMI  Neck: Supple, no JVD Endocrine: No thryomegaly Cardiac: Normal S1, S2; RRR; 3 out of 6 holosystolic murmur Lungs: Clear to auscultation bilaterally, no wheezing, rhonchi or rales  Abd: Soft, nontender, no hepatomegaly  Ext: No edema, pulses 2+, right femoral cath site clean and dry without hematoma or bruit Musculoskeletal: No deformities, BUE and BLE strength normal and equal Skin: Warm and dry, no rashes   Neuro: Alert and oriented to person, place, time, and situation, CNII-XII grossly intact, no focal deficits  Psych: Normal mood and affect   Labs  High Sensitivity Troponin:  No results for input(s): TROPONINIHS in the last 720 hours.   Cardiac EnzymesNo results for input(s): TROPONINI in the last 168 hours. No results for input(s): TROPIPOC in the last 168 hours.  Chemistry Recent Labs  Lab 10/04/20 0211 10/05/20 0431 10/06/20 1529  10/07/20 0401 10/07/20 0952 10/07/20 0953 10/07/20 0956 10/08/20 0335  NA 132* 134* 133* 133*   < > 136 134* 132*  K 4.2 4.0 3.9 4.2   < > 4.3 4.3 4.6  CL 91* 94* 93* 94*  --   --   --  93*  CO2 '23 25 22 23  '$ --   --   --  25  GLUCOSE 119* 115* 109* 102*  --   --   --  114*  BUN 93* 53* 77* 34*  --   --   --  43*  CREATININE 10.74* 7.85* 10.60* 6.39*  --   --   --  8.59*  CALCIUM 8.0* 7.6* 7.3* 7.9*  --   --   --  8.5*  PROT 7.4 7.6  --  7.8  --   --   --   --   ALBUMIN 2.2* 2.2* 2.1* 2.2*  --   --   --   --   AST 22 25  --  33  --   --   --   --   ALT 12 13  --  16  --   --   --   --   ALKPHOS 75 70  --  57  --   --   --   --   BILITOT 1.2 1.1  --  0.8  --   --   --   --   GFRNONAA 5* 8* 5* 10*  --   --   --  7*  ANIONGAP 18* 15 18* 16*  --   --   --  14   < > = values in this interval not displayed.    Hematology Recent Labs  Lab 10/06/20 1529 10/07/20 0401 10/07/20 [id number] 10/07/20 0953 10/07/20 0956 10/08/20 0335  WBC 18.7* 19.1*  --   --   --  19.2*  RBC 2.94* 3.04*  --   --   --  3.47*  HGB 8.1* 8.4*   < > 9.5* 9.2* 9.3*  HCT 24.5* 25.4*   < > 28.0* 27.0* 29.7*  MCV 83.3 83.6  --   --   --  85.6  MCH 27.6 27.6  --   --   --  26.8  MCHC 33.1 33.1  --   --   --  31.3  RDW 17.3* 17.3*  --   --   --  17.5*  PLT 221 270  --   --   --  295   < > = values in this interval not displayed.   BNPNo results for input(s): BNP, PROBNP in  the last 168 hours.  DDimer No results for input(s): DDIMER in the last 168 hours.   Radiology  DG Orthopantogram  Result Date: 10/06/2020 CLINICAL DATA:  Preop evaluation EXAM: ORTHOPANTOGRAM/PANORAMIC COMPARISON:  None. FINDINGS: Panoramic view of the mandible reveals no acute fracture. Lucency is seen surrounding the root of the left maxillary canine tooth with extensive erosive changes consistent with dental caries. First maxillary bicuspid on the left also demonstrates extensive dental caries. Erosive changes of the lateral incisors in the  mandible are noted as well consistent with dental caries. IMPRESSION: Multifocal dental caries. Periapical lucency involving the left maxillary canine tooth. Electronically Signed   By: Inez Catalina M.D.   On: 10/06/2020 16:00   DG Chest 2 View  Result Date: 10/06/2020 CLINICAL DATA:  Left upper lobe lung mass EXAM: CHEST - 2 VIEW COMPARISON:  09/29/2020 FINDINGS: Cardiac shadow is enlarged. The lungs are well aerated bilaterally. Known cavitary left upper lobe mass is obscured by overlying EKG lead. Vascular stent is noted on the left. Calcified soft tissue mass is noted about the right shoulder stable from prior CT. IMPRESSION: Left upper lobe mass lesion is not well appreciated on this exam. Chronic changes as described above. Electronically Signed   By: Inez Catalina M.D.   On: 10/06/2020 16:02   CARDIAC CATHETERIZATION  Result Date: 10/07/2020 Ed Anness is a 50 y.o. male  [id number] LOCATION:  FACILITY: [facility] PHYSICIAN: Quay Burow, M.D. October 27, 1970 DATE OF PROCEDURE:  10/07/2020 DATE OF DISCHARGE: CARDIAC CATHETERIZATION History obtained from chart review.Zaide Suares is a 50 y.o. 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  A 2D echo revealed vegetation on the mitral valve with mild to moderate mitral regurgitation.  He was seen in consultation by Dr. Roxy Manns, cardiothoracic surgery for potential operative therapy.  Right left heart cath was recommended to define his anatomy and physiology. HEMODYNAMICS: Right heart cath Right atrial pressure-15/8, mean 7 Right ventricular pressure-43/3 Pulmonary artery pressure-37/8, mean 23 Pulmonary artery wedge pressure-A-wave 19, V wave 19,  mean 14 LVEDP-22 Cardiac output-15.4 L/min with an index of 7.76 L/min/m.    Mr. Migneault has clean coronary arteries, mildly elevated LVEDP and no evidence of a large V wave.  If he needs mitral valve replacement/repair his coronary anatomy is clean.  I did not place closure devices since the patient has potential SBE and sepsis I do not want to leave a foreign body in the patient's artery or vein.  The sheath will be removed and pressure held.  The patient left lab in stable condition. Quay Burow. MD, [facility] 10/07/2020 10:10 AM    Cardiac Studies  Heart Cath 10/07/2020  HEMODYNAMICS: Right heart cath  Right atrial pressure-15/8, mean 7 Right ventricular pressure-43/3 Pulmonary artery pressure-37/8, mean 23 Pulmonary artery wedge pressure-A-wave 19, V wave 19, mean 14 LVEDP-22 Cardiac output-15.4 L/min with an index of 7.76 L/min/m.  TEE 10/05/2020 1. Left ventricular ejection fraction, by estimation, is 60 to 65%. The  left ventricle has normal function. There is moderate left ventricular  hypertrophy.  2. Right ventricular systolic function is normal. The right ventricular  size is normal.  3. Left atrial size was moderately dilated. No left atrial/left atrial  appendage thrombus was detected.  4.  Right atrial size was moderately dilated.  5. Large vegetation on the mitral valve.  6. The posterior mitral leaflet appears perforated. There is extensive  vegetation of both mitral leaflets, visualized with 2D/3D methods. The  mitral valve is abnormal. Mild to moderate mitral valve regurgitation.  7. The aortic valve is tricuspid. Aortic valve regurgitation is not  visualized.  8. There is mild (Grade II) protruding plaque involving the transverse  and descending aorta.  9. Agitated saline contrast bubble study was negative, with no evidence  of any interatrial shunt.   Patient Profile   Kassian Burnet a 50 y.o.malewith ESRD on hemodialysis, hypertension, OSA,  diastolic heart failure, liver cirrhosis due to hepatitis C who was admitted with generalized weakness and found to have new onset aphasia with embolic strokes. Blood cultures positive for MRSA.  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  Assessment & Plan   1.  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 -Right heart cath showed he has excess volume.  Would recommend more aggressive dialysis. -Right femoral cath site clean and dry but hematoma or bruit.  Looks well. -Cardiology will follow remotely moving forward.  He is stable.  2.  Elevated filling pressures/volume overload -Would recommend more aggressive dialysis.  Suspect this is likely related to severe MR. -Surgery planned next week.  [facility] will sign off.   Medication Recommendations: None. Other recommendations (labs, testing, etc): Proceed with mitral valve surgery next week. Follow up as an outpatient: Please arrange follow-up with Dr. Golden Hurter when he is ultimately discharged after surgery.  For questions or updates, please contact [facility] Please consult [email or web address] for contact info under   Time Spent with Patient: I have spent a total of 15 minutes with patient reviewing hospital notes, telemetry, EKGs, labs and examining the patient as well as establishing an assessment and plan that was discussed with the patient.  > 50% of time was spent in direct patient care.    Signed, Addison Naegeli. Audie Box, [place]  10/08/2020 9:50 AM

## 2020-10-08 NOTE — Progress Notes (Signed)
  Speech Language Pathology Treatment: Dysphagia  Patient Details Name: Patrick Anthony MRN: [id number] DOB: 14-Feb-1971 Today's Date: 10/08/2020 Time: [id number] SLP Time Calculation (min) (ACUTE ONLY): 11 min  Assessment / Plan / Recommendation Clinical Impression  Pt benefited from verbal cues to participate in therapy; however, PO trials were still limited despite SLP encouragement. No overt s/s of aspiration were noted with the PO trials that he was willing to consume. Provided education on the importance of increased participation with PO trials during therapy to assess safety of his oropharyngeal swallow. Pt agreed and shows emergent awareness but still preferred to be left alone. Attempted language tasks as well to reassess his cognitive-communicative skills, but pt only minimally participated with tasks such as naming objects. Recommend that pt remain on regular diet with thin liquids. SLP will sign off for dysphagia given that pt's impulsivity has improved and no signs of difficulty have been noted since being on this regular diet per RN report and review of his vitals and respiratory status. SLP will still continue to follow up for cognitive therapy.    HPI HPI: Pt is a 50 y.o male with sepsis likely due to pneumonia. MRI 2/22 showed scattered small acute infarcts within the bilateral cerebral  hemispheres, right basal ganglia, callosal splenium and bilateral cerebellar hemispheres. CXR from 2/21 revealed low lung volumes with streaky left basilar opacities which could  represent atelectasis or infection. PMH with significant history for ESRD on dialysis, hypertension, and CHF.      SLP Plan  Continue with current plan of care       Recommendations  Diet recommendations: Regular;Thin liquid Liquids provided via: Cup;Straw Medication Administration: Whole meds with liquid Supervision: Patient able to self feed;Full supervision/cueing for compensatory strategies Compensations: Slow  rate;Small sips/bites Postural Changes and/or Swallow Maneuvers: Seated upright 90 degrees                Oral Care Recommendations: Oral care BID Follow up Recommendations: Inpatient Rehab;24 hour supervision/assistance SLP Visit Diagnosis: Dysphagia, unspecified (R13.10) Plan: Continue with current plan of care       GO                Jeanine Luz., SLP Student 10/08/2020, 12:22 PM

## 2020-10-08 NOTE — Progress Notes (Signed)
Pre-op MVR has been completed.   Preliminary results in CV Proc.   Abram Sander 10/08/2020 1:20 PM

## 2020-10-08 NOTE — Progress Notes (Signed)
Patrick Anthony Progress Note  Subjective: His cath was relatively clean- tentatively planning for mitral valve surgery sometime next week -  He is without complaints today-  Due for HD  Vitals:   10/07/20 2216 10/07/20 2218 10/07/20 2320 10/08/20 0320  BP: 118/74 118/74 110/82 (!) 134/96  Pulse:  85 93 80  Resp:   20 (!) 23  Temp:   98 F (36.7 C) 98.6 F (37 C)  TempSrc:   Oral Oral  SpO2:   96% 96%  Weight:    77.1 kg  Height:        Exam:   Gen - alert, conversant  no jvd  Chest cta bilat  Cor reg no RG  Abd soft ntnd no ascites   Ext no obvious LE edema   Alert, NF, ox3   LUA AVG +bruit     OP HD: [facility]   4h  350/500  82.5kg  2/2.5 bath  P1  Hep 5500  LUA AVG   Aranesp 51mg q2wks - last 09/27/20   Hectorol 366m IV qHD    Assessment/ Plan: 1. MRSA bacteremia: w/ fevers and AMS.  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 Has AVG LUA w/o gross signs of infection. Getting IV vanc.  surgical evaluation for this endocarditis-  Seems like candidate for surgery-   heart cath done, maybe surgery next week  2. 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 AMS - due to infection +/- aphasia from CVA's. Resolved, Ox 3.  4. ESRD - usual HD MWF.  Next HD tomorrow via AVG.  5. Hypertension/volume  - Blood pressure chronically hard to control, on 4 bp lowering meds- amlodipine 10, clonidine #1 patch, hydralazine 50 BID, metoprolol 75 BID. BP's wnl, even low right now-  I am taking this opportunity to remove clonidine patch and wean him off clonidine. Wt is under 1-2kg. 1-2 L goal w/ next HD- will need EDW adjust at discharge.   6. Anemia of CKD - Hgb 9.3- then in the 8's.  Last esa at OP HD was 2/21, will  order here  100 weekly.  Iron sat low, holding iron given active endocarditis- back in the 9's 7. Secondary Hyperparathyroidism -  Ca in goal, phos up.  Continue hectorol, sensipar and  phoslo- inc dose 8. Nutrition - Renal  diet w/fluid restriction  9. Hx Hep C /liver cirrhosis  Patrick Anthony A Patrick Anthony  10/08/2020, 10:00 AM   Recent Labs  Lab 10/05/20 0431 10/06/20 1529 10/07/20 0401 10/07/20 0952 10/07/20 0956 10/08/20 0335  K 4.0 3.9 4.2   < > 4.3 4.6  BUN 53* 77* 34*  --   --  43*  CREATININE 7.85* 10.60* 6.39*  --   --  8.59*  CALCIUM 7.6* 7.3* 7.9*  --   --  8.5*  PHOS 6.0* 8.3*  --   --   --   --   HGB 8.7* 8.1* 8.4*   < > 9.2* 9.3*   < > = values in this interval not displayed.   Inpatient medications: . amLODipine  10 mg Oral Daily  . calcium acetate  1,334 mg Oral TID WC  . calcium acetate  667 mg Oral With snacks  . Chlorhexidine Gluconate Cloth  6 each Topical Q0600  . Chlorhexidine Gluconate Cloth  6 each Topical Q0600  . cinacalcet  30 mg Oral Q breakfast  . darbepoetin (ARANESP) injection - DIALYSIS  100  mcg Intravenous Q Wed-HD  . doxercalciferol  3 mcg Intravenous Q M,W,F-HD  . feeding supplement (NEPRO CARB STEADY)  237 mL Oral BID BM  . hydrALAZINE  50 mg Oral BID  . metoprolol tartrate  75 mg Oral BID  . multivitamin  1 tablet Oral QHS  . sodium chloride flush  3 mL Intravenous Q12H   . sodium chloride    . vancomycin     sodium chloride, acetaminophen **OR** acetaminophen (TYLENOL) oral liquid 160 mg/5 mL **OR** acetaminophen, acetaminophen, albuterol, HYDROmorphone (DILAUDID) injection, morphine injection, ondansetron (ZOFRAN) IV, ondansetron **OR** [DISCONTINUED] ondansetron (ZOFRAN) IV, oxyCODONE, polyethylene glycol, sodium chloride flush

## 2020-10-08 NOTE — Progress Notes (Signed)
Pharmacy Antibiotic Note  Patrick Anthony is a 50 y.o. male admitted on 09/27/2020 with fever, chills, and generalized weakness.  P found to have MRSA bacteremia with concerns for possible infective endocaarditis (TEE pending).  Patient continues on Vancomycin per pharmacy, abx d #8.  Pt has ESRD with HD MWF.    The patient's pre-HD level on 2/28 was elevated at 41 mcg/ml. The dose was held with HD on 2/28 - will give a reduced dose tomorrow with HD, then start a maintenance dose of 750 mg on MWF starting on 3/4.   Plan: - Vancomycin 750 mg/HD-MWF starting on Fri, 3/4 - should get dose after HD today. - Will continue to follow HD schedule/duration, culture results, LOT, and antibiotic de-escalation plans   Height: 6' (182.9 cm) Weight: 77.1 kg (169 lb 14.4 oz) IBW/kg (Calculated) : 77.6  Temp (24hrs), Avg:98.5 F (36.9 C), Min:98 F (36.7 C), Max:98.8 F (37.1 C)  Recent Labs  Lab 10/04/20 0211 10/05/20 0431 10/06/20 1529 10/07/20 0401 10/08/20 0335  WBC 26.0* 20.6* 18.7* 19.1* 19.2*  CREATININE 10.74* 7.85* 10.60* 6.39* 8.59*  VANCORANDOM 41  --   --   --   --     Estimated Creatinine Clearance: 11.3 mL/min (A) (by C-G formula based on SCr of 8.59 mg/dL (H)).    No Known Allergies  Antimicrobials this admission: Vanc 2/21>> Cefepime 2/21>>2/22 Flagyl 2/21>>2/22 (2 doses given)  Dose adjustments this admission: 2/28 Pre-HD VR 41 - estimate post HD level 27 Dose held 2/28 and reduced to '750mg'$  IV qHD starting 3/2  Microbiology results: 2/21 blood x 2: MRSA, sens Vanc MIC < 0.5, Septra, TCN, gent MIC < 0.5, Clinda 2/22 BCID: MRSA  2/21 COVID and flu: negative 2/21 HIV: non-reactive 2/24 blood x 2: 1/2 SA 2/27 blood x 2: pending  Thank you for allowing pharmacy to be a part of this patient's care.  [place] Crane, Vena Austria, BCPS, BCCP Clinical Pharmacist  10/08/2020 1:20 PM   [facility] pharmacy phone numbers are listed on [email or web address]

## 2020-10-08 NOTE — Progress Notes (Signed)
PROGRESS NOTE  Jadynn Hurdle [id number] DOB: 1971-01-06 DOA: 09/27/2020 PCP: [name], MD   LOS: 11 days   Brief Narrative / Interim history: 50 year old male with medical history of end-stage renal disease due to APKD on hemodialysis MWF through left upper extremity AV graft, hypertension, OSA, chronic diastolic CHF, EF [id number], liver cirrhosis due to hepatitis C, history of alcohol abuse came to hospital with generalized weakness.  He was admitted with sepsis due to suspected pneumonia and started on antibiotics.  Code stroke was called on hospital day 1 and imaging showed embolic strokes.  His blood cultures were positive for staph aureus and he's had TEE on 3/1 which is concerning for mitral valve endocarditis.  Currently on vancomycin.  ID, neurology, cardiology, and CT surgery are following.     Subjective / 24h Interval events: Feeling better this morning. No chest pain, no shortness of breath   Assessment & Plan: Principal Problem Mitral valve endocarditis, MRSA bacteremia-ID is following, patient has been started on vancomycin.  Surveillance blood cultures obtained on 2/27 after initiation of antibiotics remain negative.  CT scan of the abdomen and pelvis on 2/23 with cavitary mass in the left upper lobe, possibly septic embolus, as well as small foci of consolidation and groundglass density in the right upper lobe and small groundglass nodule in the left upper lobe (infectious versus inflammatory), CT also with polycystic kidney disease.  -Cardiothoracic surgery consulting, he is a surgical candidate, underwent cardiac cath yesterday with clean coronaries. Awaiting cardiothoracic surgery input re: timing   Active Problems New onset aphasia, scattered small acute infarcts in the bilateral cerebral hemispheres, right basal ganglia, callosal splenium, bilateral cerebral hemispheres concerning for embolic process -likely due to endocarditis.  2D echo showed EF 55-60%, grade 2 DD.   An MRI of the head and neck without appreciable stenosis.  Acute metabolic encephalopathy-improving  Seizure-like activity upper extremities-myoclonus suspected, hold gabapentin.  EEG negative  ESRD due to adult polycystic kidney disease-continue dialysis per nephrology  Iron deficiency anemia, anemia of chronic disease-monitor hemoglobin  Essential hypertension-continue regimen as below  Liver cirrhosis, history of hep C-monitor  Scheduled Meds: . amLODipine  10 mg Oral Daily  . calcium acetate  1,334 mg Oral TID WC  . calcium acetate  667 mg Oral With snacks  . Chlorhexidine Gluconate Cloth  6 each Topical Q0600  . cinacalcet  30 mg Oral Q breakfast  . darbepoetin (ARANESP) injection - DIALYSIS  100 mcg Intravenous Q Wed-HD  . doxercalciferol  3 mcg Intravenous Q M,W,F-HD  . feeding supplement (NEPRO CARB STEADY)  237 mL Oral BID BM  . hydrALAZINE  50 mg Oral BID  . metoprolol tartrate  75 mg Oral BID  . multivitamin  1 tablet Oral QHS  . sodium chloride flush  3 mL Intravenous Q12H   Continuous Infusions: . sodium chloride    . vancomycin     PRN Meds:.sodium chloride, acetaminophen **OR** acetaminophen (TYLENOL) oral liquid 160 mg/5 mL **OR** acetaminophen, acetaminophen, albuterol, HYDROmorphone (DILAUDID) injection, morphine injection, ondansetron (ZOFRAN) IV, ondansetron **OR** [DISCONTINUED] ondansetron (ZOFRAN) IV, oxyCODONE, polyethylene glycol, sodium chloride flush  Diet Orders (From admission, onward)    Start     Ordered   10/07/20 1502  Diet renal with fluid restriction Fluid restriction: 1200 mL Fluid; Room service appropriate? Yes; Fluid consistency: Thin  Diet effective now       Question Answer Comment  Fluid restriction: 1200 mL Fluid   Room service appropriate? Yes   Fluid  consistency: Thin      10/07/20 1501          DVT prophylaxis: SCD's Start: 09/28/20 1041     Code Status: Full Code  Family Communication: no family at bedside  Status  is: Inpatient  Remains inpatient appropriate because:Inpatient level of care appropriate due to severity of illness   Dispo: The patient is from: Home              Anticipated d/c is to: SNF              Patient currently is not medically stable to d/c.   Difficult to place patient No    Level of care: Progressive Cardiac  Consultants:  Neurology ID Cardiology Cardiothoracic surgery  Procedures:  2D echo, TEE, cardiac cath  Microbiology  Blood cultures MRSA Surveillance 2/27 - negative  Antimicrobials: Vancomycin     Objective: Vitals:   10/07/20 2216 10/07/20 2218 10/07/20 2320 10/08/20 0320  BP: 118/74 118/74 110/82 (!) 134/96  Pulse:  85 93 80  Resp:   20 (!) 23  Temp:   98 F (36.7 C) 98.6 F (37 C)  TempSrc:   Oral Oral  SpO2:   96% 96%  Weight:    77.1 kg  Height:        Intake/Output Summary (Last 24 hours) at 10/08/2020 0654 Last data filed at 10/08/2020 0100 Gross per 24 hour  Intake 480 ml  Output --  Net 480 ml   Filed Weights   10/06/20 1837 10/07/20 0127 10/08/20 0320  Weight: 76.5 kg 77.4 kg 77.1 kg    Examination:  Constitutional: NAD Eyes: no scleral icterus  ENMT: mmm Neck: normal, supple Respiratory: CTA biL, no wheezing, no crackles Cardiovascular: rrr, no mrg, no edema  Abdomen: soft, nt, nd, bs+ Musculoskeletal: no clubbing / cyanosis.  Skin: no rashes seen Neurologic: non focal   Data Reviewed: I have independently reviewed following labs and imaging studies   CBC: Recent Labs  Lab 10/01/20 0818 10/02/20 0315 10/03/20 0242 10/04/20 0211 10/05/20 0431 10/06/20 1529 10/07/20 0401 10/07/20 0952 10/07/20 0953 10/07/20 0956 10/08/20 0335  WBC 38.5* 32.2* 31.1* 26.0* 20.6* 18.7* 19.1*  --   --   --  19.2*  NEUTROABS 31.6* 29.3* 28.0* 23.9* 15.7*  --   --   --   --   --   --   HGB 9.1* 9.5* 8.6* 8.7* 8.7* 8.1* 8.4* 9.5* 9.5* 9.2* 9.3*  HCT 27.5* 27.6* 26.5* 25.3* 26.1* 24.5* 25.4* 28.0* 28.0* 27.0* 29.7*  MCV 80.6  80.9 81.8 80.3 82.6 83.3 83.6  --   --   --  85.6  PLT 191 185 294 217 230 221 270  --   --   --  [id number]   Basic Metabolic Panel: Recent Labs  Lab 10/01/20 0818 10/02/20 0315 10/03/20 0242 10/04/20 0211 10/05/20 0431 10/06/20 1529 10/07/20 0401 10/07/20 0952 10/07/20 0953 10/07/20 0956 10/08/20 0335  NA 138 135 133* 132* 134* 133* 133* 136 136 134* 132*  K 4.6 4.1 3.9 4.2 4.0 3.9 4.2 4.3 4.3 4.3 4.6  CL 96* 96* 91* 91* 94* 93* 94*  --   --   --  93*  CO2 21* '23 23 23 25 22 23  '$ --   --   --  25  GLUCOSE 94 115* 123* 119* 115* 109* 102*  --   --   --  114*  BUN 103* 54* 71* 93* 53* 77* 34*  --   --   --  43*  CREATININE 11.05* 7.00* 8.93* 10.74* 7.85* 10.60* 6.39*  --   --   --  8.59*  CALCIUM 8.5* 8.6* 8.2* 8.0* 7.6* 7.3* 7.9*  --   --   --  8.5*  MG 3.1* 2.5* 2.6* 2.8* 2.5*  --   --   --   --   --   --   PHOS 7.5* 5.3* 6.0* 7.0* 6.0* 8.3*  --   --   --   --   --    Liver Function Tests: Recent Labs  Lab 10/02/20 0315 10/03/20 0242 10/04/20 0211 10/05/20 0431 10/06/20 1529 10/07/20 0401  AST '25 19 22 25  '$ --  33  ALT '13 11 12 13  '$ --  16  ALKPHOS 78 75 75 70  --  57  BILITOT 1.7* 1.1 1.2 1.1  --  0.8  PROT 7.8 7.3 7.4 7.6  --  7.8  ALBUMIN 2.3* 2.2* 2.2* 2.2* 2.1* 2.2*   Coagulation Profile: Recent Labs  Lab 10/01/20 0818 10/02/20 0315 10/04/20 0211  INR 1.4* 1.3* 1.1   HbA1C: No results for input(s): HGBA1C in the last 72 hours. CBG: Recent Labs  Lab 10/02/20 1124  GLUCAP 122*    Recent Results (from the past 240 hour(s))  Culture, blood (routine x 2)     Status: None   Collection Time: 09/30/20  6:04 AM   Specimen: BLOOD RIGHT FOREARM  Result Value Ref Range Status   Specimen Description BLOOD RIGHT FOREARM  Final   Special Requests   Final    BOTTLES DRAWN AEROBIC AND ANAEROBIC Blood Culture adequate volume   Culture   Final    NO GROWTH 5 DAYS Performed at [facility], [address], [place], [place] [address]    Report Status 10/05/2020  FINAL  Final  Culture, blood (routine x 2)     Status: Abnormal   Collection Time: 09/30/20  6:08 AM   Specimen: BLOOD RIGHT HAND  Result Value Ref Range Status   Specimen Description BLOOD RIGHT HAND  Final   Special Requests   Final    BOTTLES DRAWN AEROBIC AND ANAEROBIC Blood Culture results may not be optimal due to an inadequate volume of blood received in culture bottles   Culture  Setup Time   Final    GRAM POSITIVE COCCI IN CLUSTERS ANAEROBIC BOTTLE ONLY CRITICAL VALUE NOTED.  VALUE IS CONSISTENT WITH PREVIOUSLY REPORTED AND CALLED VALUE.    Culture (A)  Final    STAPHYLOCOCCUS AUREUS SUSCEPTIBILITIES PERFORMED ON PREVIOUS CULTURE WITHIN THE LAST 5 DAYS. Performed at [facility], [place] [address] [place], [place] [address]    Report Status 10/03/2020 FINAL  Final  Culture, blood (routine x 2)     Status: None (Preliminary result)   Collection Time: 10/03/20  2:42 AM   Specimen: BLOOD  Result Value Ref Range Status   Specimen Description BLOOD LEFT ARM  Final   Special Requests   Final    BOTTLES DRAWN AEROBIC AND ANAEROBIC Blood Culture results may not be optimal due to an inadequate volume of blood received in culture bottles   Culture   Final    NO GROWTH 4 DAYS Performed at [facility], [place] [address], [place], [place] [address]    Report Status PENDING  Incomplete  Culture, blood (routine x 2)     Status: None (Preliminary result)   Collection Time: 10/03/20  2:42 AM   Specimen: BLOOD  Result Value Ref Range Status   Specimen Description BLOOD LEFT HAND  Final   Special Requests   Final    BOTTLES DRAWN AEROBIC ONLY Blood Culture results may not be optimal due to an inadequate volume of blood received in culture bottles   Culture   Final    NO GROWTH 4 DAYS Performed at [facility] [address], [place], [place] [address]    Report Status PENDING  Incomplete     Radiology Studies: CARDIAC CATHETERIZATION  Result Date:  10/07/2020 Asah Ramsier is a 50 y.o. male  [id number] LOCATION:  FACILITY: [facility] PHYSICIAN: Quay Burow, M.D. Sep 02, 1970 DATE OF PROCEDURE:  10/07/2020 DATE OF DISCHARGE: CARDIAC CATHETERIZATION History obtained from chart review.Fazal Sangster is a 50 y.o. 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  A 2D echo revealed vegetation on the mitral valve with mild to moderate mitral regurgitation.  He was seen in consultation by Dr. Roxy Manns, cardiothoracic surgery for potential operative therapy.  Right left heart cath was recommended to define his anatomy and physiology. HEMODYNAMICS: Right heart cath Right atrial pressure-15/8, mean 7 Right ventricular pressure-43/3 Pulmonary artery pressure-37/8, mean 23 Pulmonary artery wedge pressure-A-wave 19, V wave 19, mean 14 LVEDP-22 Cardiac output-15.4 L/min with an index of 7.76 L/min/m.    Mr. Lardieri has clean coronary arteries, mildly elevated LVEDP and no evidence of a large V wave.  If he needs mitral valve replacement/repair his coronary anatomy is clean.  I did not place closure devices since the patient has potential SBE and sepsis I do not want to leave a foreign body in the patient's artery or vein.  The sheath will be removed and pressure held.  The patient left lab in stable condition. Quay Burow. MD, [facility] 10/07/2020 10:10 AM     Marzetta Board, MD, PhD [facility]  Between 7 am - 7 pm I am available, please contact me via Amion or Securechat  Between 7 pm - 7 am I am not available, please contact night coverage MD/APP via Amion

## 2020-10-08 NOTE — Progress Notes (Signed)
Department of Dental Medicine     INPATIENT CONSULTATION  Service Date:  10/08/2020 Admitted Date:  09/27/2020  Patient Name:  Patrick Anthony Date of Birth:   1971-03-14 Medical Record Number: [id number]  Referring Provider:              Darylene Price, MD  PLAN & RECOMMENDATIONS   > There are no current signs of acute odontogenic infection including abscess, edema or erythema, or suspicious lesion requiring biopsy.  The patient does have 4 grossly decayed teeth/retained root tips with apical infections, several other teeth with caries and periodontal concerns. >> Recommend extractions of all indicated teeth to decrease the risk of perioperative and postoperative systemic infection and complications. >>> Discussed case with Dr. Roxy Manns and will coordinate treatment as needed.  --- Scheduled patient for 3/8 in the OR to have teeth extracted under general anesthesia  before his valve repair on 3/10.  >>  Recommend the patient establish care at a dental office of their choice for routine dental care including replacement of missing teeth, cleanings and exams. >> Discussed in detail all treatment options with the patient and they are agreeable to the plan.  Thank you for consulting with Hospital Dentistry and for the opportunity to participate in this patient's treatment.  Should you have any questions or concerns, please contact the [facility] at [phone].   10/08/2020     CONSULT NOTE   COVID 19 SCREENING: The patient denies symptoms concerning for COVID-19 infection including fever, chills, cough, or newly developed shortness of breath.   HISTORY OF PRESENT ILLNESS: Patrick Anthony is a very pleasant 50 y.o. male with h/o HTN (uncontrolled), ESRD on dialysis, anemia, mitral regurgitation and CHF who is currently admitted for mitral valve endocarditis.  Hospital Dentistry was consulted to evaluate the patient as part of the patient's Pre-Mitral valve repair work-up.  DENTAL  HISTORY: > The patient reports that he has not been to dentist in several years and that he does not have a dentist he sees regularly.  They currently deny any dental/orofacial pain or sensitivity. >> Patient is able to manage oral secretions.  Patient denies dysphagia, odynophagia, dysphonia, SOB and neck pain.  Patient denies fever, rigors and malaise.   CHIEF COMPLAINT: Dental consult in preparation for cardiac surgery.   Patient Active Problem List   Diagnosis Date Noted  . Mitral regurgitation   . Uncontrolled hypertension   . Patent foramen ovale   . Cerebral embolism with cerebral infarction 09/29/2020  . 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 Bacteremia due to Staphylococcus aureus   . Sepsis ([place]) 09/27/2020  . Pneumonia due to infectious agent 09/27/2020  . 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 Sepsis due to pneumonia ([place]) 09/27/2020  . Lactic acidosis 09/27/2020  . Bacterial endocarditis 09/27/2020  . Chest pain 05/03/2017  . End stage renal disease ([place]) 10/17/2013  . Anemia in chronic kidney disease 08/30/2013  . Hepatitis C 08/30/2013  . ESRD on dialysis ([place]) 08/30/2013  . Hypertensive crisis 08/28/2013  . Hypertensive emergency 04/20/2013  . Chronic combined systolic and diastolic congestive heart failure ([place]) 04/19/2013  . HTN (hypertension) 04/19/2013   Past Medical History:  Diagnosis Date  . 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 Chronic kidney disease    patient on dialysis tues-thurs-sat  . Cirrhosis of liver ([place])   . Drug addiction ([place])   . ETOH  abuse   . Hepatitis   . Hypertension   . Mitral regurgitation   . Patent foramen ovale 10/23/2019  . Renal disorder renal failure  . Sleep apnea   . Systolic and diastolic CHF, 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[place]) 04/19/2013   Past Surgical History:  Procedure Laterality Date  . AV FISTULA PLACEMENT Right   . AV  FISTULA PLACEMENT Left 09/03/2013   Procedure: ARTERIOVENOUS (AV) FISTULA CREATION - LEFT BRACHIOCEPHALIC;  Surgeon: Conrad Herman, MD;  [facility];  Service: Vascular;  Laterality: Left;  . AV FISTULA PLACEMENT Left 10/30/2019   Procedure: INSERTION OF ARTERIOVENOUS (AV) GORE-TEX GRAFT ARM;  Surgeon: Rosetta Posner, MD;  Location: [place];  Service: Vascular;  Laterality: Left;  . BUBBLE STUDY  10/05/2020   Procedure: BUBBLE STUDY;  Surgeon: Pixie Casino, MD;  Location: [place];  Service: Cardiovascular;;  . INSERTION OF DIALYSIS CATHETER N/A 08/29/2013   Procedure: INSERTION OF DIALYSIS CATHETER;  Surgeon: Mal Misty, MD;  Location: [place];  Service: Vascular;  Laterality: N/A;  . INSERTION OF DIALYSIS CATHETER Left 10/30/2019   Procedure: INSERTION OF DIALYSIS CATHETER LEFT INTERNAL JUGULAR;  Surgeon: Rosetta Posner, MD;  Location: [place];  Service: Vascular;  Laterality: Left;  . LEFT HEART CATH AND CORONARY ANGIOGRAPHY N/A 10/07/2020   Procedure: LEFT HEART CATH AND CORONARY ANGIOGRAPHY;  Surgeon: Lorretta Harp, MD;  Location: [place] CV LAB;  Service: Cardiovascular;  Laterality: N/A;  . PERIPHERAL VASCULAR CATHETERIZATION N/A 01/06/2015   Procedure: A/V Shuntogram/Fistulagram;  Surgeon: Katha Cabal, MD;  Location: [place] CV [facility];  Service: Cardiovascular;  Laterality: N/A;  . PERIPHERAL VASCULAR CATHETERIZATION Left 01/06/2015   Procedure: A/V Shunt Intervention;  Surgeon: Katha Cabal, MD;  Location: [place] [facility];  Service: Cardiovascular;  Laterality: Left;  . TEE WITHOUT CARDIOVERSION N/A 10/05/2020   Procedure: TRANSESOPHAGEAL ECHOCARDIOGRAM (TEE);  Surgeon: Pixie Casino, MD;  Location: [facility];  Service: Cardiovascular;  Laterality: N/A;   No Known Allergies Current Facility-Administered Medications  Medication Dose Route Frequency Provider Last Rate Last Admin  . 0.9 %  sodium chloride infusion  250 mL Intravenous PRN Lorretta Harp, MD       . acetaminophen (TYLENOL) tablet 650 mg  650 mg Oral Q4H PRN Pixie Casino, MD   650 mg at 10/01/20 1643   Or  . acetaminophen (TYLENOL) 160 MG/5ML solution 650 mg  650 mg Per Tube Q4H PRN Pixie Casino, MD       Or  . acetaminophen (TYLENOL) suppository 650 mg  650 mg Rectal Q4H PRN Pixie Casino, MD   650 mg at 09/28/20 2327  . acetaminophen (TYLENOL) tablet 650 mg  650 mg Oral Q4H PRN Lorretta Harp, MD      . albuterol (PROVENTIL) (2.5 MG/3ML) 0.083% nebulizer solution 2.5 mg  2.5 mg Nebulization Q2H PRN Oswald Hillock, MD      . amLODipine (NORVASC) tablet 10 mg  10 mg Oral Daily Pixie Casino, MD   10 mg at 10/07/20 [phone]  . calcium acetate (PHOSLO) capsule 1,334 mg  1,334 mg Oral TID WC Lorretta Harp, MD   1,334 mg at 10/08/20 0914  . calcium acetate (PHOSLO) capsule 667 mg  667 mg Oral With snacks Oswald Hillock, MD   667 mg at 10/07/20 2058  . Chlorhexidine Gluconate Cloth 2 % PADS 6 each  6 each Topical Q0600 Lorretta Harp, MD  6 each at 10/07/20 0521  . Chlorhexidine Gluconate Cloth 2 % PADS 6 each  6 each Topical Q0600 Corliss Parish, MD      . cinacalcet (SENSIPAR) tablet 30 mg  30 mg Oral Q breakfast Oswald Hillock, MD   30 mg at 10/08/20 1004  . Darbepoetin Alfa (ARANESP) injection 100 mcg  100 mcg Intravenous Q Wed-HD Lorretta Harp, MD   100 mcg at 10/06/20 1623  . doxercalciferol (HECTOROL) injection 3 mcg  3 mcg Intravenous Q M,W,F-HD Pixie Casino, MD   3 mcg at 10/06/20 1621  . feeding supplement (NEPRO CARB STEADY) liquid 237 mL  237 mL Oral BID BM Lorretta Harp, MD   237 mL at 10/07/20 1509  . hydrALAZINE (APRESOLINE) tablet 50 mg  50 mg Oral BID Pixie Casino, MD   50 mg at 10/07/20 2216  . HYDROmorphone (DILAUDID) injection 0.5 mg  0.5 mg Intravenous Q4H PRN Pixie Casino, MD   0.5 mg at 09/30/20 2052  . metoprolol tartrate (LOPRESSOR) tablet 75 mg  75 mg Oral BID Pixie Casino, MD   75 mg at 10/07/20 2218  . morphine 2 MG/ML  injection 2 mg  2 mg Intravenous Q1H PRN Lorretta Harp, MD      . multivitamin (RENA-VIT) tablet 1 tablet  1 tablet Oral QHS Lorretta Harp, MD   1 tablet at 10/07/20 2216  . ondansetron (ZOFRAN) injection 4 mg  4 mg Intravenous Q6H PRN Lorretta Harp, MD      . ondansetron [facility]) tablet 4 mg  4 mg Oral Q6H PRN Oswald Hillock, MD   4 mg at 09/27/20 2104  . oxyCODONE (Oxy IR/ROXICODONE) immediate release tablet 5 mg  5 mg Oral Q4H PRN Oswald Hillock, MD   5 mg at 10/01/20 2117  . polyethylene glycol (MIRALAX / GLYCOLAX) packet 17 g  17 g Oral Daily PRN Oswald Hillock, MD      . sodium chloride flush (NS) 0.9 % injection 3 mL  3 mL Intravenous Q12H Lorretta Harp, MD   3 mL at 10/07/20 2219  . sodium chloride flush (NS) 0.9 % injection 3 mL  3 mL Intravenous PRN Lorretta Harp, MD      . vancomycin (VANCOCIN) IVPB 750 mg/150 ml premix  750 mg Intravenous Q M,W,F-HD Lorretta Harp, MD       Facility-Administered Medications Ordered in Other Encounters  Medication Dose Route Frequency Provider Last Rate Last Admin  . regadenoson (LEXISCAN) injection SOLN 0.4 mg  0.4 mg Intravenous Once Cherlynn Kaiser A, MD      . technetium tetrofosmin (TC-MYOVIEW) injection [id number] millicurie  [id number] millicurie Intravenous Once PRN Cherlynn Kaiser A, MD      . technetium tetrofosmin (TC-MYOVIEW) injection [id number] millicurie  [id number] millicurie Intravenous Once PRN Elouise Munroe, MD        LABS: Lab Results  Component Value Date   WBC 19.2 (H) 10/08/2020   HGB 9.3 (L) 10/08/2020   HCT 29.7 (L) 10/08/2020   MCV 85.6 10/08/2020   PLT 295 10/08/2020      Component Value Date/Time   NA 132 (L) 10/08/2020 0335   K 4.6 10/08/2020 0335   CL 93 (L) 10/08/2020 0335   CO2 25 10/08/2020 0335   GLUCOSE 114 (H) 10/08/2020 0335   BUN 43 (H) 10/08/2020 0335   CREATININE 8.59 (H) 10/08/2020 0335   CALCIUM 8.5 (L) 10/08/2020 0335   GFRNONAA  7 (L) 10/08/2020 0335   GFRAA 5 (L) 10/31/2019 0252   Lab  Results  Component Value Date   INR 1.1 10/04/2020   INR 1.3 (H) 10/02/2020   INR 1.4 (H) 10/01/2020   No results found for: PTT  Social History   Socioeconomic History  . Marital status: Single    Spouse name: Not on file  . Number of children: Not on file  . Years of education: Not on file  . Highest education level: Not on file  Occupational History  . Not on file  Tobacco Use  . Smoking status: Former Smoker    Packs/day: 0.20    Types: Cigarettes    Quit date: 10/05/2017    Years since quitting: 3.0  . Smokeless tobacco: Never Used  . Tobacco comment: Quit 3 years ago  Vaping Use  . Vaping Use: Never used  Substance and Sexual Activity  . Alcohol use: No  . Drug use: No    Comment: Quit  . Sexual activity: Not Currently    Birth control/protection: Condom  Other Topics Concern  . Not on file  Social History Narrative   ** Merged History Encounter **       Social Determinants of Health   Financial Resource Strain: Not on file  Food Insecurity: Not on file  Transportation Needs: Not on file  Physical Activity: Not on file  Stress: Not on file  Social Connections: Not on file  Intimate Partner Violence: Not on file   Family History  Problem Relation Age of Onset  . Hypertension Mother   . Cancer Mother        breast     REVIEW OF SYSTEMS: Reviewed with the patient as per HPI. PSYCH: Patient denies having dental phobia.  VITAL SIGNS: BP (!) 134/96 (BP Location: Right Arm)   Pulse 80   Temp 98.6 F (37 C) (Oral)   Resp (!) 23   Ht 6' (1.829 m)   Wt 77.1 kg   SpO2 96%   BMI 23.04 kg/m    PHYSICAL EXAM: >> General:  Well-developed, comfortable and in no apparent distress. >> Neurological:  Alert and oriented to person, place and  time. >> Extraoral:  Facial symmetry present without any edema or erythema.  No swelling or lymphadenopathy.  TMJ asymptomatic without clicks or crepitations. >> Intraoral:  Soft tissues appear well-perfused and  mucous membranes moist.  FOM and vestibules soft and not raised. Oral cavity without mass or lesion. No signs of infection, parulis, sinus tract, edema or erythema evident upon exam.   DENTAL EXAM: All clinical findings charted. >> Dentition: Overall fair remaining dentition. Missing teeth, caries, retained root tips.  >> The patient is maintaining poor oral hygiene. >> Periodontal: Inflamed, erythematous gingival tissue. Generalized plaque and calculus accumulation. Gingival recession with signs of periodontal disease. >> Caries: Multiple teeth with gross caries and broken down teeth due to decay. #12 fractured/broken with decay, non-restorable. >> Retained Root Tips: #11 (not clinically visible), #23 and #26 >> Endodontics: #15 with deep distal decay would likely need RCT + crown if restorable; decay extends subgingival. >> Removable/Fixed Prosthodontics: Patient denies wearing partial dentures. >> Occlusion/Other: Unable to assess molar occlusion. Non-functional teeth numbers 2, 3, 28, 14 and 15. Supra-erupted teeth numbers 2, 3, 14 and 15. Attrition/wear on incisal surfaces of #8 and #9.   RADIOGRAPHIC EXAM:  10/06/20 Orthopanogram interpreted. >> Condyles seated bilaterally in fossas.  No evidence of abnormal pathology.  All visualized osseous structures  appear WNL. Evidence of healing extraction sites in lower left and right quadrants. >> Generalized mild to moderate horizontal bone loss consistent with mild to moderate periodontitis. Radiographic calculus accumulation evident on lower central incisors. >> Missing teeth, caries- #12 and #15 deep decay approximating the pulp. #11, #23 and #26 retained root tips with periapical radiolucencies.   ASSESSMENT:  1. Mitral valve endocarditis 2. Pre - cardiac surgery dental consultation 3. Congestive heart failure 4. ESRD 5. HTN 6. Missing teeth 7. Caries 8. Retained root tips 9. Chronic apical periodontitis 10. Accretions on teeth 11.  Chronic periodontitis 12. Excessive attrition 13. Gingival recession 14. Postoperative bleeding risk   PLAN AND RECOMMENDATIONS: > I discussed the risks, benefits, and complications of various scenarios with the patient in relationship to their medical and dental conditions, which included systemic infection such as recurrent endocarditis, bacteremia or other serious issues that could potentially occur either before, during or after their anticipated heart surgery if dental/oral concerns are not addressed.  I explained that if any chronic or acute dental/oral infection(s) are addressed and subsequently not maintained following medical optimization and recovery, their risk of the previously mentioned complications are just as high and could still occur postoperatively.  I explained all significant findings of the dental consultation with the patient including multiple decayed teeth and retained root tips that are chronically infected, and the recommended care including extractions of indicated teeth 11, 12, 23 and 26 and potentially more if during procedure there are other teeth that are found to be infected or grossly decayed in order to optimize them for heart surgery from a dental standpoint (#15 will also likely need to be extracted due to depth of decay).  The patient verbalized understanding of all findings, discussion, and recommendations. >> We then discussed various treatment options to include no treatment, multiple extractions with alveoloplasty, pre-prosthetic surgery as indicated, periodontal therapy, dental restorations, root canal therapy, crown and bridge therapy, implant therapy, and replacement of missing teeth as indicated.  The patient verbalized understanding of all options, and currently wishes to proceed with extractions of all indicated teeth with alveoloplasty as needed. Recommend case be completed in the operating room under general anesthesia due to patient's current medical  condition and postoperative bleeding risk. >>> Plan to discuss all findings and recommendations with medical team and coordinate future care as needed.  <> The patient tolerated today's visit well.  All questions and concerns were addressed and answered before conclusion of the consultation.    [place] Benson [place], D.M.D.

## 2020-10-08 NOTE — Progress Notes (Signed)
PT Cancellation Note  Patient Details Name: Patrick Anthony MRN: [id number] DOB: 12/26/1970   Cancelled Treatment:    Reason Eval/Treat Not Completed: Patient declined, no reason specified. Pt adamantly refusing stating, "I don't feel like it." Reiterated importance of mobility especially if pt is to have surgery next week. Expect recovery will be very slow if he continues to not mobilize.  Pt told last therapist that he wanted to "do as little as possible." Will continue attempts.    [place] 10/08/2020, 12:19 PM [place] Pager [phone] Office [phone]

## 2020-10-08 NOTE — Progress Notes (Signed)
OT Cancellation Note  Patient Details Name: Patrick Anthony MRN: [id number] DOB: [birth year]   Cancelled Treatment:    Reason Eval/Treat Not Completed: Patient declined, no reason specified. Educated in importance of going into MVR surgery strong. Pt continued to refuses OOB.  Malka So 10/08/2020, 2:17 PM  Nestor Lewandowsky, OTR/L Acute Rehabilitation Services Pager: [phone] Office: [phone]

## 2020-10-09 DIAGNOSIS — D631 Anemia in chronic kidney disease: Secondary | ICD-10-CM | POA: Diagnosis not present

## 2020-10-09 DIAGNOSIS — J984 Other disorders of lung: Secondary | ICD-10-CM | POA: Diagnosis not present

## 2020-10-09 DIAGNOSIS — R7881 Bacteremia: Secondary | ICD-10-CM | POA: Diagnosis not present

## 2020-10-09 DIAGNOSIS — I330 Acute and subacute infective endocarditis: Secondary | ICD-10-CM | POA: Diagnosis not present

## 2020-10-09 DIAGNOSIS — Z992 Dependence on renal dialysis: Secondary | ICD-10-CM | POA: Diagnosis not present

## 2020-10-09 DIAGNOSIS — K036 Deposits [accretions] on teeth: Secondary | ICD-10-CM | POA: Diagnosis not present

## 2020-10-09 DIAGNOSIS — B9561 Methicillin susceptible Staphylococcus aureus infection as the cause of diseases classified elsewhere: Secondary | ICD-10-CM | POA: Diagnosis not present

## 2020-10-09 DIAGNOSIS — Q612 Polycystic kidney, adult type: Secondary | ICD-10-CM | POA: Diagnosis not present

## 2020-10-09 DIAGNOSIS — I6310 Cerebral infarction due to embolism of unspecified precerebral artery: Secondary | ICD-10-CM | POA: Diagnosis not present

## 2020-10-09 DIAGNOSIS — K045 Chronic apical periodontitis: Secondary | ICD-10-CM | POA: Diagnosis not present

## 2020-10-09 DIAGNOSIS — N186 End stage renal disease: Secondary | ICD-10-CM | POA: Diagnosis not present

## 2020-10-09 NOTE — Progress Notes (Addendum)
Subjective:  no complaints this a.m., said tolerated dialysis yesterday 2 L UF  Objective Vital signs in last 24 hours: Vitals:   10/08/20 2032 10/08/20 2124 10/09/20 0454 10/09/20 0526  BP: 124/83 (!) 131/92 125/73 115/88  Pulse:  84    Resp:   18 17  Temp: 98.5 F (36.9 C)  98.3 F (36.8 C) 98.5 F (36.9 C)  TempSrc: Oral  Oral Tympanic  SpO2:   99%   Weight:    79.9 kg  Height:       Weight change: 2.334 kg  Physical Exam: General: Alert, NAD Heart: RRR, 2/6 murmur, no RG Lungs: CTA bilaterally nonlabored breathing Abdomen: Soft with positive soft NTND no ascites Extremities: No pedal edema  Dialysis Access: LUA AVG positive bruit   OP HD: [facility]   4h  350/500  82.5kg  2/2.5 bath  P1  Hep 5500  LUA AVG   Aranesp 79mg q2wks - last 09/27/20   Hectorol372m IV qHD   Problem/Plan: 1. MRSA bacteremia: w/ fevers and AMS.  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 Has AVG LUA w/o gross signs of infection. Getting IV vanc.  surgical evaluation for this endocarditis-Dr OwRoxy Mannsval tentative plan valve repair 3/10 after tooth removal 3/8. . Marland KitchenCardiac cath  3/03 no significant CAD 2. 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 AMS - due to infection +/- aphasia from CVA's. Resolved, Ox 3.  4. ESRD - usual HD MWF.  Next HD-Monday 3/07 via AVG.  5. Hypertension/volume-a.m. BP 115/88 blood pressure chronically hard to control, has been on 4 bp lowering meds- amlodipine 10, clonidine #1 patch, hydralazine 50 BID, metoprolol 75 BID. BP's wnl, even low right now-HAVE DC clonidine patch,Wt is under 3 kg .  Yesterday tolerated -2 L goal, attempt same next dialysis  will need EDW adjust at discharge.   6. Anemiaof CKD- Hgb 9.3- . Last esa at OP HD was 2/21, Aranesp here 100 weekly last on 3/02.  Iron sat low, holding iron given active endocarditis-  7. Secondary Hyperparathyroidism -Ca in goal, phos 8.3 up. Continue hectorol, sensipar  and  phoslo- inc dose 3/03 follow-up labs 8. Nutrition- Renal diet w/fluid restriction on Nepro Albumin 2.2 9. Hx Hep C /liver cirrhosis   DaErnest HaberPA-C [id number]/12/2020,9:06 AM  LOS: 12 days    Patient seen and examined, agree with above note with above modifications. Seen-  Says is good-  Overwhelmed with process  -  Just ready to get it over with -  Plan for routine HD on Monday and appropriate titration of HD related meds  KeCorliss ParishMD 10/09/2020     Labs: Basic Metabolic Panel: Recent Labs  Lab 10/04/20 0211 10/05/20 0431 10/06/20 1529 10/07/20 0401 10/07/20 [id number] 0953 10/07/20 0956 10/08/20 0335  NA 132* 134* 133* 133*   < > 136 134* 132*  K 4.2 4.0 3.9 4.2   < > 4.3 4.3 4.6  CL 91* 94* 93* 94*  --   --   --  93*  CO2 '23 25 22 23  '$ --   --   --  25  GLUCOSE 119* 115* 109* 102*  --   --   --  114*  BUN 93* 53* 77* 34*  --   --   --  43*  CREATININE 10.74* 7.85* 10.60* 6.39*  --   --   --  8.59*  CALCIUM 8.0* 7.6* 7.3* 7.9*  --   --   --  8.5*  PHOS 7.0* 6.0* 8.3*  --   --   --   --   --    < > = values in this interval not displayed.   Liver Function Tests: Recent Labs  Lab 10/04/20 0211 10/05/20 0431 10/06/20 1529 10/07/20 0401  AST 22 25  --  33  ALT 12 13  --  16  ALKPHOS 75 70  --  57  BILITOT 1.2 1.1  --  0.8  PROT 7.4 7.6  --  7.8  ALBUMIN 2.2* 2.2* 2.1* 2.2*   No results for input(s): LIPASE, AMYLASE in the last 168 hours. No results for input(s): AMMONIA in the last 168 hours. CBC: Recent Labs  Lab 10/03/20 0242 10/04/20 0211 10/05/20 0431 10/06/20 1529 10/07/20 0401 10/07/20 0952 10/07/20 0953 10/07/20 0956 10/08/20 0335  WBC 31.1* 26.0* 20.6* 18.7* 19.1*  --   --   --  19.2*  NEUTROABS 28.0* 23.9* 15.7*  --   --   --   --   --   --   HGB 8.6* 8.7* 8.7* 8.1* 8.4*   < > 9.5* 9.2* 9.3*  HCT 26.5* 25.3* 26.1* 24.5* 25.4*   < > 28.0* 27.0* 29.7*  MCV 81.8 80.3 82.6 83.3 83.6   --   --   --  85.6  PLT 294 217 230 221 270  --   --   --  295   < > = values in this interval not displayed.   Cardiac Enzymes: No results for input(s): CKTOTAL, CKMB, CKMBINDEX, TROPONINI in the last 168 hours. CBG: Recent Labs  Lab 10/02/20 1124  GLUCAP 122*    Studies/Results: CARDIAC CATHETERIZATION  Result Date: 10/07/2020 Sidharth Sharum is a 50 y.o. male  [id number] LOCATION:  FACILITY: [facility] PHYSICIAN: Quay Burow, M.D. Dec 24, 1970 DATE OF PROCEDURE:  10/07/2020 DATE OF DISCHARGE: CARDIAC CATHETERIZATION History obtained from chart review.Jani Brouhard is a 50 y.o. 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  A 2D echo revealed vegetation on the mitral valve with mild to moderate mitral regurgitation.  He was seen in consultation by Dr. Roxy Manns, cardiothoracic surgery for potential operative therapy.  Right left heart cath was recommended to define his anatomy and physiology. HEMODYNAMICS: Right heart cath Right atrial pressure-15/8, mean 7 Right ventricular pressure-43/3 Pulmonary artery pressure-37/8, mean 23 Pulmonary artery wedge pressure-A-wave 19, V wave 19, mean 14 LVEDP-22 Cardiac output-15.4 L/min with an index of 7.76 L/min/m.    Mr. Schou has clean coronary arteries, mildly elevated LVEDP and no evidence of a large V wave.  If he needs mitral valve replacement/repair his coronary anatomy is clean.  I did not place closure devices since the patient has potential SBE and sepsis I do not want to leave a foreign body in the patient's artery or vein.  The sheath will be removed and pressure held.  The patient left lab in stable condition. Quay Burow. MD, [facility]  10/07/2020 10:10 AM   VAS US DOPPLER PRE CABG  Result Date: 10/08/2020 PREOPERATIVE VASCULAR EVALUATION  Indications:      Pre-op MVR. Risk Factors:     Hypertension, Diabetes. Comparison Study: no prior  Performing Technologist: Abram Sander RVS  Examination Guidelines: A complete evaluation includes B-mode imaging, spectral Doppler, color Doppler, and power Doppler as needed of all accessible portions of each vessel. Bilateral testing is considered an integral part of a complete examination. Limited examinations for reoccurring indications may be performed as noted.  Right Carotid Findings: +----------+--------+--------+--------+------------+--------+           PSV cm/sEDV cm/sStenosisDescribe    Comments +----------+--------+--------+--------+------------+--------+ CCA Prox  69      17              heterogenous         +----------+--------+--------+--------+------------+--------+ CCA Distal57      17              heterogenous         +----------+--------+--------+--------+------------+--------+ ICA Prox  60      29      1-39%   heterogenous         +----------+--------+--------+--------+------------+--------+ ICA Distal73      29                                   +----------+--------+--------+--------+------------+--------+ ECA       65      19                                   +----------+--------+--------+--------+------------+--------+ Portions of this table do not appear on this page. +----------+--------+-------+--------+------------+           PSV cm/sEDV cmsDescribeArm Pressure +----------+--------+-------+--------+------------+ Subclavian96                                  +----------+--------+-------+--------+------------+ +---------+--------+--+--------+--+---------+ VertebralPSV cm/s63EDV cm/s19Antegrade +---------+--------+--+--------+--+---------+ Left Carotid Findings: +----------+--------+--------+--------+------------+--------+            PSV cm/sEDV cm/sStenosisDescribe    Comments +----------+--------+--------+--------+------------+--------+ CCA Prox  68      21              heterogenous         +----------+--------+--------+--------+------------+--------+ CCA Distal52      15              heterogenous         +----------+--------+--------+--------+------------+--------+ ICA Prox  44      15      1-39%   heterogenous         +----------+--------+--------+--------+------------+--------+ ICA Distal38      13                                   +----------+--------+--------+--------+------------+--------+ ECA       46      8                                    +----------+--------+--------+--------+------------+--------+ +----------+--------+--------+--------+------------+ SubclavianPSV cm/sEDV cm/sDescribeArm Pressure +----------+--------+--------+--------+------------+           102     47                           +----------+--------+--------+--------+------------+ +---------+--------+--+--------+--+---------+ VertebralPSV cm/s70EDV cm/s24Antegrade +---------+--------+--+--------+--+---------+  ABI Findings: +--------+------------------+-----+---------+--------+ Right   Rt Pressure (mmHg)IndexWaveform Comment  +--------+------------------+-----+---------+--------+ Brachial  triphasic         +--------+------------------+-----+---------+--------+ +--------+------------------+-----+--------+-------+ Left    Lt Pressure (mmHg)IndexWaveformComment +--------+------------------+-----+--------+-------+ Brachial                               Fistula +--------+------------------+-----+--------+-------+  Right Doppler Findings: +--------+--------+-----+---------+--------+ Site    PressureIndexDoppler  Comments +--------+--------+-----+---------+--------+ Brachial             triphasic         +--------+--------+-----+---------+--------+ Radial                triphasic         +--------+--------+-----+---------+--------+ Ulnar                triphasic         +--------+--------+-----+---------+--------+  Left Doppler Findings: +--------+--------+-----+---------+--------+ Site    PressureIndexDoppler  Comments +--------+--------+-----+---------+--------+ Brachial                      Fistula  +--------+--------+-----+---------+--------+ Radial               triphasic         +--------+--------+-----+---------+--------+ Ulnar                triphasic         +--------+--------+-----+---------+--------+  Summary: Right Carotid: Velocities in the right ICA are consistent with a 1-39% stenosis. Left Carotid: Velocities in the left ICA are consistent with a 1-39% stenosis. Vertebrals: Bilateral vertebral arteries demonstrate antegrade flow. Right Upper Extremity: Doppler waveforms decrease 50% with right radial compression. Doppler waveforms remain within normal limits with right ulnar compression. Left Upper Extremity: Doppler waveforms decrease 50% with left radial compression. Doppler waveforms decrease 50% with left ulnar compression.  Electronically signed by Jamelle Haring on 10/08/2020 at 4:51:35 PM.    Final    Medications: . sodium chloride    . vancomycin Stopped (10/08/20 2143)   . amLODipine  10 mg Oral Daily  . calcium acetate  1,334 mg Oral TID WC  . calcium acetate  667 mg Oral With snacks  . Chlorhexidine Gluconate Cloth  6 each Topical Q0600  . Chlorhexidine Gluconate Cloth  6 each Topical Q0600  . cinacalcet  30 mg Oral Q breakfast  . darbepoetin (ARANESP) injection - DIALYSIS  100 mcg Intravenous Q Wed-HD  . doxercalciferol  3 mcg Intravenous Q M,W,F-HD  . feeding supplement (NEPRO CARB STEADY)  237 mL Oral BID BM  . hydrALAZINE  50 mg Oral BID  . metoprolol tartrate  75 mg Oral BID  . multivitamin  1 tablet Oral QHS  . sodium chloride flush  3 mL Intravenous Q12H

## 2020-10-09 NOTE — Progress Notes (Signed)
PROGRESS NOTE  Hideo Tobiason [id number] DOB: 1971/02/08 DOA: 09/27/2020 PCP: [name], MD   LOS: 12 days   Brief Narrative / Interim history: 50 year old male with medical history of end-stage renal disease due to APKD on hemodialysis MWF through left upper extremity AV graft, hypertension, OSA, chronic diastolic CHF, EF [id number], liver cirrhosis due to hepatitis C, history of alcohol abuse came to hospital with generalized weakness.  He was admitted with sepsis due to suspected pneumonia and started on antibiotics.  Code stroke was called on hospital day 1 and imaging showed embolic strokes.  His blood cultures were positive for staph aureus and he's had TEE on 3/1 which is concerning for mitral valve endocarditis.  Currently on vancomycin.  ID, neurology, cardiology, and CT surgery are following.     Subjective / 24h Interval events: No complaints, no chest pain, no shortness of breath  Assessment & Plan: Principal Problem Mitral valve endocarditis, MRSA bacteremia-ID is following, patient has been started on vancomycin.  Surveillance blood cultures obtained on 2/27 after initiation of antibiotics remain negative.  CT scan of the abdomen and pelvis on 2/23 with cavitary mass in the left upper lobe, possibly septic embolus, as well as small foci of consolidation and groundglass density in the right upper lobe and small groundglass nodule in the left upper lobe (infectious versus inflammatory), CT also with polycystic kidney disease.  -Cardiothoracic surgery consulting, he is a surgical candidate, underwent cardiac cath 3/3 with clean coronaries.  Cardiothoracic surgery plans valve repair next week.  Dental to take the OR next week also for several decayed teeth extraction  Active Problems New onset aphasia, scattered small acute infarcts in the bilateral cerebral hemispheres, right basal ganglia, callosal splenium, bilateral cerebral hemispheres concerning for embolic process -likely due  to endocarditis.  2D echo showed EF 55-60%, grade 2 DD.  An MRI of the head and neck without appreciable stenosis.  Acute metabolic encephalopathy-improving  Seizure-like activity upper extremities-myoclonus suspected, hold gabapentin.  EEG negative  ESRD due to adult polycystic kidney disease-continue dialysis per nephrology  Iron deficiency anemia, anemia of chronic disease-monitor hemoglobin  Essential hypertension-continue regimen as below  Liver cirrhosis, history of hep C-monitor  Scheduled Meds: . amLODipine  10 mg Oral Daily  . calcium acetate  1,334 mg Oral TID WC  . calcium acetate  667 mg Oral With snacks  . Chlorhexidine Gluconate Cloth  6 each Topical Q0600  . Chlorhexidine Gluconate Cloth  6 each Topical Q0600  . cinacalcet  30 mg Oral Q breakfast  . darbepoetin (ARANESP) injection - DIALYSIS  100 mcg Intravenous Q Wed-HD  . doxercalciferol  3 mcg Intravenous Q M,W,F-HD  . feeding supplement (NEPRO CARB STEADY)  237 mL Oral BID BM  . hydrALAZINE  50 mg Oral BID  . metoprolol tartrate  75 mg Oral BID  . multivitamin  1 tablet Oral QHS  . sodium chloride flush  3 mL Intravenous Q12H   Continuous Infusions: . sodium chloride    . vancomycin Stopped (10/08/20 2143)   PRN Meds:.sodium chloride, acetaminophen, albuterol, HYDROmorphone (DILAUDID) injection, morphine injection, ondansetron (ZOFRAN) IV, ondansetron **OR** [DISCONTINUED] ondansetron (ZOFRAN) IV, oxyCODONE, polyethylene glycol, sodium chloride flush  Diet Orders (From admission, onward)    Start     Ordered   10/07/20 1502  Diet renal with fluid restriction Fluid restriction: 1200 mL Fluid; Room service appropriate? Yes; Fluid consistency: Thin  Diet effective now       Question Answer Comment  Fluid restriction:  1200 mL Fluid   Room service appropriate? Yes   Fluid consistency: Thin      10/07/20 1501          DVT prophylaxis: SCD's Start: 09/28/20 1041     Code Status: Full Code  Family  Communication: no family at bedside  Status is: Inpatient  Remains inpatient appropriate because:Inpatient level of care appropriate due to severity of illness   Dispo: The patient is from: Home              Anticipated d/c is to: SNF              Patient currently is not medically stable to d/c.   Difficult to place patient No    Level of care: Progressive Cardiac  Consultants:  Neurology ID Cardiology Cardiothoracic surgery  Procedures:  2D echo, TEE, cardiac cath  Microbiology  Blood cultures MRSA Surveillance 2/27 - negative  Antimicrobials: Vancomycin     Objective: Vitals:   10/08/20 2032 10/08/20 2124 10/09/20 0454 10/09/20 0526  BP: 124/83 (!) 131/92 125/73 115/88  Pulse:  84    Resp:   18 17  Temp: 98.5 F (36.9 C)  98.3 F (36.8 C) 98.5 F (36.9 C)  TempSrc: Oral  Oral Tympanic  SpO2:   99%   Weight:    79.9 kg  Height:        Intake/Output Summary (Last 24 hours) at 10/09/2020 1117 Last data filed at 10/09/2020 0349 Gross per 24 hour  Intake 624 ml  Output 2500 ml  Net -1876 ml   Filed Weights   10/08/20 0320 10/08/20 1650 10/09/20 0526  Weight: 77.1 kg 79.4 kg 79.9 kg    Examination:  Constitutional: He is in no distress Eyes: No icterus ENMT: mmm Neck: normal, supple Respiratory: Lungs are clear bilaterally, no wheezing heard Cardiovascular: regular rate and rhythm, no edema Abdomen: Soft, nontender, nondistended, bowel sounds positive Musculoskeletal: no clubbing / cyanosis.  Skin: No rashes seen Neurologic: No focal deficits  Data Reviewed: I have independently reviewed following labs and imaging studies   CBC: Recent Labs  Lab 10/03/20 0242 10/04/20 0211 10/05/20 0431 10/06/20 1529 10/07/20 0401 10/07/20 0952 10/07/20 0953 10/07/20 0956 10/08/20 0335  WBC 31.1* 26.0* 20.6* 18.7* 19.1*  --   --   --  19.2*  NEUTROABS 28.0* 23.9* 15.7*  --   --   --   --   --   --   HGB 8.6* 8.7* 8.7* 8.1* 8.4* 9.5* 9.5* 9.2* 9.3*   HCT 26.5* 25.3* 26.1* 24.5* 25.4* 28.0* 28.0* 27.0* 29.7*  MCV 81.8 80.3 82.6 83.3 83.6  --   --   --  85.6  PLT 294 217 230 221 270  --   --   --  [id number]   Basic Metabolic Panel: Recent Labs  Lab 10/03/20 0242 10/04/20 0211 10/05/20 0431 10/06/20 1529 10/07/20 0401 10/07/20 0952 10/07/20 0953 10/07/20 0956 10/08/20 0335  NA 133* 132* 134* 133* 133* 136 136 134* 132*  K 3.9 4.2 4.0 3.9 4.2 4.3 4.3 4.3 4.6  CL 91* 91* 94* 93* 94*  --   --   --  93*  CO2 '23 23 25 22 23  '$ --   --   --  25  GLUCOSE 123* 119* 115* 109* 102*  --   --   --  114*  BUN 71* 93* 53* 77* 34*  --   --   --  43*  CREATININE 8.93* 10.74* 7.85*  10.60* 6.39*  --   --   --  8.59*  CALCIUM 8.2* 8.0* 7.6* 7.3* 7.9*  --   --   --  8.5*  MG 2.6* 2.8* 2.5*  --   --   --   --   --   --   PHOS 6.0* 7.0* 6.0* 8.3*  --   --   --   --   --    Liver Function Tests: Recent Labs  Lab 10/03/20 0242 10/04/20 0211 10/05/20 0431 10/06/20 1529 10/07/20 0401  AST '19 22 25  '$ --  33  ALT '11 12 13  '$ --  16  ALKPHOS 75 75 70  --  57  BILITOT 1.1 1.2 1.1  --  0.8  PROT 7.3 7.4 7.6  --  7.8  ALBUMIN 2.2* 2.2* 2.2* 2.1* 2.2*   Coagulation Profile: Recent Labs  Lab 10/04/20 0211  INR 1.1   HbA1C: No results for input(s): HGBA1C in the last 72 hours. CBG: Recent Labs  Lab 10/02/20 1124  GLUCAP 122*    Recent Results (from the past 240 hour(s))  Culture, blood (routine x 2)     Status: None   Collection Time: 09/30/20  6:04 AM   Specimen: BLOOD RIGHT FOREARM  Result Value Ref Range Status   Specimen Description BLOOD RIGHT FOREARM  Final   Special Requests   Final    BOTTLES DRAWN AEROBIC AND ANAEROBIC Blood Culture adequate volume   Culture   Final    NO GROWTH 5 DAYS Performed at [facility], [address] [place], [place] [address]    Report Status 10/05/2020 FINAL  Final  Culture, blood (routine x 2)     Status: Abnormal   Collection Time: 09/30/20  6:08 AM   Specimen: BLOOD RIGHT HAND  Result  Value Ref Range Status   Specimen Description BLOOD RIGHT HAND  Final   Special Requests   Final    BOTTLES DRAWN AEROBIC AND ANAEROBIC Blood Culture results may not be optimal due to an inadequate volume of blood received in culture bottles   Culture  Setup Time   Final    GRAM POSITIVE COCCI IN CLUSTERS ANAEROBIC BOTTLE ONLY CRITICAL VALUE NOTED.  VALUE IS CONSISTENT WITH PREVIOUSLY REPORTED AND CALLED VALUE.    Culture (A)  Final    STAPHYLOCOCCUS AUREUS SUSCEPTIBILITIES PERFORMED ON PREVIOUS CULTURE WITHIN THE LAST 5 DAYS. Performed at [facility], [place] [address] [place], [place] [address]    Report Status 10/03/2020 FINAL  Final  Culture, blood (routine x 2)     Status: None   Collection Time: 10/03/20  2:42 AM   Specimen: BLOOD  Result Value Ref Range Status   Specimen Description BLOOD LEFT ARM  Final   Special Requests   Final    BOTTLES DRAWN AEROBIC AND ANAEROBIC Blood Culture results may not be optimal due to an inadequate volume of blood received in culture bottles   Culture   Final    NO GROWTH 5 DAYS Performed at [facility], [place] [address], [place], [place] [address]    Report Status 10/08/2020 FINAL  Final  Culture, blood (routine x 2)     Status: None   Collection Time: 10/03/20  2:42 AM   Specimen: BLOOD  Result Value Ref Range Status   Specimen Description BLOOD LEFT HAND  Final   Special Requests   Final    BOTTLES DRAWN AEROBIC ONLY Blood Culture results  may not be optimal due to an inadequate volume of blood received in culture bottles   Culture   Final    NO GROWTH 5 DAYS Performed at [facility], [place] [address] [place], [place] [address]    Report Status 10/08/2020 FINAL  Final     Radiology Studies: VAS US DOPPLER PRE CABG  Result Date: 10/08/2020 PREOPERATIVE VASCULAR EVALUATION  Indications:      Pre-op MVR. Risk Factors:     Hypertension, Diabetes. Comparison Study: no prior Performing Technologist: Abram Sander RVS   Examination Guidelines: A complete evaluation includes B-mode imaging, spectral Doppler, color Doppler, and power Doppler as needed of all accessible portions of each vessel. Bilateral testing is considered an integral part of a complete examination. Limited examinations for reoccurring indications may be performed as noted.  Right Carotid Findings: +----------+--------+--------+--------+------------+--------+           PSV cm/sEDV cm/sStenosisDescribe    Comments +----------+--------+--------+--------+------------+--------+ CCA Prox  69      17              heterogenous         +----------+--------+--------+--------+------------+--------+ CCA Distal57      17              heterogenous         +----------+--------+--------+--------+------------+--------+ ICA Prox  60      29      1-39%   heterogenous         +----------+--------+--------+--------+------------+--------+ ICA Distal73      29                                   +----------+--------+--------+--------+------------+--------+ ECA       65      19                                   +----------+--------+--------+--------+------------+--------+ Portions of this table do not appear on this page. +----------+--------+-------+--------+------------+           PSV cm/sEDV cmsDescribeArm Pressure +----------+--------+-------+--------+------------+ Subclavian96                                  +----------+--------+-------+--------+------------+ +---------+--------+--+--------+--+---------+ VertebralPSV cm/s63EDV cm/s19Antegrade +---------+--------+--+--------+--+---------+ Left Carotid Findings: +----------+--------+--------+--------+------------+--------+           PSV cm/sEDV cm/sStenosisDescribe    Comments +----------+--------+--------+--------+------------+--------+ CCA Prox  68      21              heterogenous         +----------+--------+--------+--------+------------+--------+ CCA  Distal52      15              heterogenous         +----------+--------+--------+--------+------------+--------+ ICA Prox  44      15      1-39%   heterogenous         +----------+--------+--------+--------+------------+--------+ ICA Distal38      13                                   +----------+--------+--------+--------+------------+--------+ ECA       46      8                                    +----------+--------+--------+--------+------------+--------+ +----------+--------+--------+--------+------------+  SubclavianPSV cm/sEDV cm/sDescribeArm Pressure +----------+--------+--------+--------+------------+           102     47                           +----------+--------+--------+--------+------------+ +---------+--------+--+--------+--+---------+ VertebralPSV cm/s70EDV cm/s24Antegrade +---------+--------+--+--------+--+---------+  ABI Findings: +--------+------------------+-----+---------+--------+ Right   Rt Pressure (mmHg)IndexWaveform Comment  +--------+------------------+-----+---------+--------+ Brachial                       triphasic         +--------+------------------+-----+---------+--------+ +--------+------------------+-----+--------+-------+ Left    Lt Pressure (mmHg)IndexWaveformComment +--------+------------------+-----+--------+-------+ Brachial                               Fistula +--------+------------------+-----+--------+-------+  Right Doppler Findings: +--------+--------+-----+---------+--------+ Site    PressureIndexDoppler  Comments +--------+--------+-----+---------+--------+ Brachial             triphasic         +--------+--------+-----+---------+--------+ Radial               triphasic         +--------+--------+-----+---------+--------+ Ulnar                triphasic         +--------+--------+-----+---------+--------+  Left Doppler Findings: +--------+--------+-----+---------+--------+  Site    PressureIndexDoppler  Comments +--------+--------+-----+---------+--------+ Brachial                      Fistula  +--------+--------+-----+---------+--------+ Radial               triphasic         +--------+--------+-----+---------+--------+ Ulnar                triphasic         +--------+--------+-----+---------+--------+  Summary: Right Carotid: Velocities in the right ICA are consistent with a 1-39% stenosis. Left Carotid: Velocities in the left ICA are consistent with a 1-39% stenosis. Vertebrals: Bilateral vertebral arteries demonstrate antegrade flow. Right Upper Extremity: Doppler waveforms decrease 50% with right radial compression. Doppler waveforms remain within normal limits with right ulnar compression. Left Upper Extremity: Doppler waveforms decrease 50% with left radial compression. Doppler waveforms decrease 50% with left ulnar compression.  Electronically signed by Jamelle Haring on 10/08/2020 at 4:51:35 PM.    Final      Marzetta Board, MD, PhD [facility]  Between 7 am - 7 pm I am available, please contact me via Amion or Securechat  Between 7 pm - 7 am I am not available, please contact night coverage MD/APP via Amion

## 2020-10-10 DIAGNOSIS — J984 Other disorders of lung: Secondary | ICD-10-CM | POA: Diagnosis not present

## 2020-10-10 DIAGNOSIS — D631 Anemia in chronic kidney disease: Secondary | ICD-10-CM | POA: Diagnosis not present

## 2020-10-10 DIAGNOSIS — N186 End stage renal disease: Secondary | ICD-10-CM | POA: Diagnosis not present

## 2020-10-10 DIAGNOSIS — K045 Chronic apical periodontitis: Secondary | ICD-10-CM | POA: Diagnosis not present

## 2020-10-10 DIAGNOSIS — B9561 Methicillin susceptible Staphylococcus aureus infection as the cause of diseases classified elsewhere: Secondary | ICD-10-CM | POA: Diagnosis not present

## 2020-10-10 DIAGNOSIS — K036 Deposits [accretions] on teeth: Secondary | ICD-10-CM | POA: Diagnosis not present

## 2020-10-10 DIAGNOSIS — R7881 Bacteremia: Secondary | ICD-10-CM | POA: Diagnosis not present

## 2020-10-10 DIAGNOSIS — Q612 Polycystic kidney, adult type: Secondary | ICD-10-CM | POA: Diagnosis not present

## 2020-10-10 DIAGNOSIS — I330 Acute and subacute infective endocarditis: Secondary | ICD-10-CM | POA: Diagnosis not present

## 2020-10-10 DIAGNOSIS — I6310 Cerebral infarction due to embolism of unspecified precerebral artery: Secondary | ICD-10-CM | POA: Diagnosis not present

## 2020-10-10 DIAGNOSIS — Z992 Dependence on renal dialysis: Secondary | ICD-10-CM | POA: Diagnosis not present

## 2020-10-10 MED ORDER — INFLUENZA VAC SPLIT QUAD 0.5 ML IM SUSY: 0.5000 mL | PREFILLED_SYRINGE | INTRAMUSCULAR | Status: DC

## 2020-10-10 NOTE — Progress Notes (Addendum)
Subjective: Tells me no complaints, resting in bed, for dialysis tomorrow on schedule  Objective Vital signs in last 24 hours: Vitals:   10/09/20 1358 10/09/20 1957 10/09/20 2145 10/10/20 0520  BP: 129/86 (!) 124/94 113/81 (!) 131/91  Pulse: 85  85   Resp: '17 20  17  '$ Temp:  98.5 F (36.9 C)  98.4 F (36.9 C)  TempSrc:  Oral  Oral  SpO2: 96% 99%  99%  Weight:      Height:       Weight change:    Physical Exam: General: Alert, NAD Heart: RRR, 2/6 murmur, no RG Lungs: CTA bilaterally nonlabored breathing Abdomen: Soft with positive soft NTND no ascites Extremities: No pedal edema  Dialysis Access: LUA AVG positive bruit  OP HD:MWF [facility] 4h 350/500 82.5kg 2/2.5 bath P1 Hep 5500 LUA AVG Aranesp 29mg q2wks - last 09/27/20 Hectorol3109m IV qHD   Problem/Plan: 1. MRSA bacteremia/ MV endocarditis: w/ fevers and AMS/ embolic CVA's. Has AVG LUA w/o gross signs of infection. Getting IV vanc. surgical evaluation -Dr OwRoxy Mannsval tentative plan valve repair 3/10 after tooth removal 3/8.. Marland KitchenCardiac cath 3/03 no significant CAD 2. 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 AMS - due to infection +/- aphasia from CVA's. Resolved, Ox 3.  4. ESRD - usual HD MWF. Next HD-Monday 3/07 via AVG.  5. Hypertension/volume-mildly labile BP blood pressure chronically hard to control, has been on 4 bp lowering meds- amlodipine 10, clonidine #1 patch, hydralazine 50 BID, metoprolol 75 BID. BP's wnl, even low right now-HAVE DC clonidine patch,Wt is under 2-3 kg .   Friday tolerated -2 L goal, attempt 2-2.5 next dialysis  will need EDW adjust at discharge.  6. Anemiaof CKD- Hgb 9.3 (3/04)-. Last esa at OP HD was 2/21, Aranesp here 100 weekly last on 3/02. Iron sat low, holding iron given active endocarditis-  7. Secondary Hyperparathyroidism -Ca in goal, phos 8.3 up. Continue hectorol, sensipar and phoslo- inc dose 3/03 follow-up labs 8. Nutrition-  Renal diet w/fluid restriction on Nepro Albumin 2.2 9. Hx Hep C /liver cirrhosis  DaErnest HaberPA-C CaBig [id number]/01/2021,7:50 AM  LOS: 13 days    Patient seen and examined, agree with above note with above modifications. No new issues- has busy week mapped out.  Plan for next HD tomorrow on schedule  KeCorliss ParishMD 10/10/2020     Labs: Basic Metabolic Panel: Recent Labs  Lab 10/04/20 0211 10/05/20 0431 10/06/20 1529 10/07/20 0401 10/07/20 0952 10/07/20 0953 10/07/20 0956 10/08/20 0335  NA 132* 134* 133* 133*   < > 136 134* 132*  K 4.2 4.0 3.9 4.2   < > 4.3 4.3 4.6  CL 91* 94* 93* 94*  --   --   --  93*  CO2 '23 25 22 23  '$ --   --   --  25  GLUCOSE 119* 115* 109* 102*  --   --   --  114*  BUN 93* 53* 77* 34*  --   --   --  43*  CREATININE 10.74* 7.85* 10.60* 6.39*  --   --   --  8.59*  CALCIUM 8.0* 7.6* 7.3* 7.9*  --   --   --  8.5*  PHOS 7.0* 6.0* 8.3*  --   --   --   --   --    < > = values in this interval not displayed.   Liver Function Tests: Recent  Labs  Lab 10/04/20 0211 10/05/20 0431 10/06/20 1529 10/07/20 0401  AST 22 25  --  33  ALT 12 13  --  16  ALKPHOS 75 70  --  57  BILITOT 1.2 1.1  --  0.8  PROT 7.4 7.6  --  7.8  ALBUMIN 2.2* 2.2* 2.1* 2.2*   No results for input(s): LIPASE, AMYLASE in the last 168 hours. No results for input(s): AMMONIA in the last 168 hours. CBC: Recent Labs  Lab 10/04/20 0211 10/05/20 0431 10/06/20 1529 10/07/20 0401 10/07/20 0952 10/07/20 0953 10/07/20 0956 10/08/20 0335  WBC 26.0* 20.6* 18.7* 19.1*  --   --   --  19.2*  NEUTROABS 23.9* 15.7*  --   --   --   --   --   --   HGB 8.7* 8.7* 8.1* 8.4*   < > 9.5* 9.2* 9.3*  HCT 25.3* 26.1* 24.5* 25.4*   < > 28.0* 27.0* 29.7*  MCV 80.3 82.6 83.3 83.6  --   --   --  85.6  PLT 217 230 221 270  --   --   --  295   < > = values in this interval not displayed.   Cardiac Enzymes: No results for input(s): CKTOTAL, CKMB, CKMBINDEX,  TROPONINI in the last 168 hours. CBG: No results for input(s): GLUCAP in the last 168 hours.  Studies/Results: VAS US DOPPLER PRE CABG  Result Date: 10/08/2020 PREOPERATIVE VASCULAR EVALUATION  Indications:      Pre-op MVR. Risk Factors:     Hypertension, Diabetes. Comparison Study: no prior Performing Technologist: Abram Sander RVS  Examination Guidelines: A complete evaluation includes B-mode imaging, spectral Doppler, color Doppler, and power Doppler as needed of all accessible portions of each vessel. Bilateral testing is considered an integral part of a complete examination. Limited examinations for reoccurring indications may be performed as noted.  Right Carotid Findings: +----------+--------+--------+--------+------------+--------+           PSV cm/sEDV cm/sStenosisDescribe    Comments +----------+--------+--------+--------+------------+--------+ CCA Prox  69      17              heterogenous         +----------+--------+--------+--------+------------+--------+ CCA Distal57      17              heterogenous         +----------+--------+--------+--------+------------+--------+ ICA Prox  60      29      1-39%   heterogenous         +----------+--------+--------+--------+------------+--------+ ICA Distal73      29                                   +----------+--------+--------+--------+------------+--------+ ECA       65      19                                   +----------+--------+--------+--------+------------+--------+ Portions of this table do not appear on this page. +----------+--------+-------+--------+------------+           PSV cm/sEDV cmsDescribeArm Pressure +----------+--------+-------+--------+------------+ Subclavian96                                  +----------+--------+-------+--------+------------+ +---------+--------+--+--------+--+---------+ VertebralPSV cm/s63EDV cm/s19Antegrade +---------+--------+--+--------+--+---------+  Left Carotid Findings: +----------+--------+--------+--------+------------+--------+  PSV cm/sEDV cm/sStenosisDescribe    Comments +----------+--------+--------+--------+------------+--------+ CCA Prox  68      21              heterogenous         +----------+--------+--------+--------+------------+--------+ CCA Distal52      15              heterogenous         +----------+--------+--------+--------+------------+--------+ ICA Prox  44      15      1-39%   heterogenous         +----------+--------+--------+--------+------------+--------+ ICA Distal38      13                                   +----------+--------+--------+--------+------------+--------+ ECA       46      8                                    +----------+--------+--------+--------+------------+--------+ +----------+--------+--------+--------+------------+ SubclavianPSV cm/sEDV cm/sDescribeArm Pressure +----------+--------+--------+--------+------------+           102     47                           +----------+--------+--------+--------+------------+ +---------+--------+--+--------+--+---------+ VertebralPSV cm/s70EDV cm/s24Antegrade +---------+--------+--+--------+--+---------+  ABI Findings: +--------+------------------+-----+---------+--------+ Right   Rt Pressure (mmHg)IndexWaveform Comment  +--------+------------------+-----+---------+--------+ Brachial                       triphasic         +--------+------------------+-----+---------+--------+ +--------+------------------+-----+--------+-------+ Left    Lt Pressure (mmHg)IndexWaveformComment +--------+------------------+-----+--------+-------+ Brachial                               Fistula +--------+------------------+-----+--------+-------+  Right Doppler Findings: +--------+--------+-----+---------+--------+ Site    PressureIndexDoppler  Comments +--------+--------+-----+---------+--------+  Brachial             triphasic         +--------+--------+-----+---------+--------+ Radial               triphasic         +--------+--------+-----+---------+--------+ Ulnar                triphasic         +--------+--------+-----+---------+--------+  Left Doppler Findings: +--------+--------+-----+---------+--------+ Site    PressureIndexDoppler  Comments +--------+--------+-----+---------+--------+ Brachial                      Fistula  +--------+--------+-----+---------+--------+ Radial               triphasic         +--------+--------+-----+---------+--------+ Ulnar                triphasic         +--------+--------+-----+---------+--------+  Summary: Right Carotid: Velocities in the right ICA are consistent with a 1-39% stenosis. Left Carotid: Velocities in the left ICA are consistent with a 1-39% stenosis. Vertebrals: Bilateral vertebral arteries demonstrate antegrade flow. Right Upper Extremity: Doppler waveforms decrease 50% with right radial compression. Doppler waveforms remain within normal limits with right ulnar compression. Left Upper Extremity: Doppler waveforms decrease 50% with left radial compression. Doppler waveforms decrease 50% with left ulnar compression.  Electronically signed by Jamelle Haring on 10/08/2020 at 4:51:35 PM.  Final    Medications: . sodium chloride    . vancomycin Stopped (10/08/20 2143)   . amLODipine  10 mg Oral Daily  . calcium acetate  1,334 mg Oral TID WC  . calcium acetate  667 mg Oral With snacks  . Chlorhexidine Gluconate Cloth  6 each Topical Q0600  . Chlorhexidine Gluconate Cloth  6 each Topical Q0600  . cinacalcet  30 mg Oral Q breakfast  . darbepoetin (ARANESP) injection - DIALYSIS  100 mcg Intravenous Q Wed-HD  . doxercalciferol  3 mcg Intravenous Q M,W,F-HD  . feeding supplement (NEPRO CARB STEADY)  237 mL Oral BID BM  . hydrALAZINE  50 mg Oral BID  . metoprolol tartrate  75 mg Oral BID  . multivitamin   1 tablet Oral QHS  . sodium chloride flush  3 mL Intravenous Q12H

## 2020-10-10 NOTE — Plan of Care (Signed)
  Problem: Education: Goal: Knowledge of disease or condition will improve Outcome: Progressing   

## 2020-10-10 NOTE — Progress Notes (Signed)
PROGRESS NOTE  Patrick Anthony [id number] DOB: [birth year] DOA: 09/27/2020 PCP: [name], MD   LOS: 13 days   Brief Narrative / Interim history: 50 year old male with medical history of end-stage renal disease due to APKD on hemodialysis MWF through left upper extremity AV graft, hypertension, OSA, chronic diastolic CHF, EF [id number], liver cirrhosis due to hepatitis C, history of alcohol abuse came to hospital with generalized weakness.  He was admitted with sepsis due to suspected pneumonia and started on antibiotics.  Code stroke was called on hospital day 1 and imaging showed embolic strokes.  His blood cultures were positive for staph aureus and he's had TEE on 3/1 which is concerning for mitral valve endocarditis.  Currently on vancomycin.  ID, neurology, cardiology, and CT surgery are following.     Subjective / 24h Interval events: Tells me his breathing is a bit hard this morning, also right groin is aching, no overnight events   Assessment & Plan: Principal Problem Mitral valve endocarditis, MRSA bacteremia-ID is following, patient has been started on vancomycin.  Surveillance blood cultures obtained on 2/27 after initiation of antibiotics remain negative.  CT scan of the abdomen and pelvis on 2/23 with cavitary mass in the left upper lobe, possibly septic embolus, as well as small foci of consolidation and groundglass density in the right upper lobe and small groundglass nodule in the left upper lobe (infectious versus inflammatory), CT also with polycystic kidney disease.  -Cardiothoracic surgery consulting, he is a surgical candidate, underwent cardiac cath 3/3 with clean coronaries.  Cardiothoracic surgery plans valve repair next week.  Dental to take the OR next week also for several decayed teeth extraction -mild shortness of breath this morning, satting well on room air, monitor  Active Problems New onset aphasia, scattered small acute infarcts in the bilateral cerebral  hemispheres, right basal ganglia, callosal splenium, bilateral cerebral hemispheres concerning for embolic process -likely due to endocarditis.  2D echo showed EF 55-60%, grade 2 DD.  An MRI of the head and neck without appreciable stenosis.  Acute metabolic encephalopathy-improving  Seizure-like activity upper extremities-myoclonus suspected, hold gabapentin.  EEG negative  ESRD due to adult polycystic kidney disease-continue dialysis per nephrology  Iron deficiency anemia, anemia of chronic disease-monitor hemoglobin  Essential hypertension-continue regimen as below  Liver cirrhosis, history of hep C-monitor  Scheduled Meds: . amLODipine  10 mg Oral Daily  . calcium acetate  1,334 mg Oral TID WC  . calcium acetate  667 mg Oral With snacks  . Chlorhexidine Gluconate Cloth  6 each Topical Q0600  . Chlorhexidine Gluconate Cloth  6 each Topical Q0600  . cinacalcet  30 mg Oral Q breakfast  . darbepoetin (ARANESP) injection - DIALYSIS  100 mcg Intravenous Q Wed-HD  . doxercalciferol  3 mcg Intravenous Q M,W,F-HD  . feeding supplement (NEPRO CARB STEADY)  237 mL Oral BID BM  . hydrALAZINE  50 mg Oral BID  . metoprolol tartrate  75 mg Oral BID  . multivitamin  1 tablet Oral QHS  . sodium chloride flush  3 mL Intravenous Q12H   Continuous Infusions: . sodium chloride    . vancomycin Stopped (10/08/20 2143)   PRN Meds:.sodium chloride, acetaminophen, albuterol, HYDROmorphone (DILAUDID) injection, morphine injection, ondansetron (ZOFRAN) IV, ondansetron **OR** [DISCONTINUED] ondansetron (ZOFRAN) IV, oxyCODONE, polyethylene glycol, sodium chloride flush  Diet Orders (From admission, onward)    Start     Ordered   10/07/20 1502  Diet renal with fluid restriction Fluid restriction: 1200 mL Fluid; Room  service appropriate? Yes; Fluid consistency: Thin  Diet effective now       Question Answer Comment  Fluid restriction: 1200 mL Fluid   Room service appropriate? Yes   Fluid consistency:  Thin      10/07/20 1501          DVT prophylaxis: SCD's Start: 09/28/20 1041     Code Status: Full Code  Family Communication: no family at bedside  Status is: Inpatient  Remains inpatient appropriate because:Inpatient level of care appropriate due to severity of illness   Dispo: The patient is from: Home              Anticipated d/c is to: SNF              Patient currently is not medically stable to d/c.   Difficult to place patient No  Level of care: Progressive Cardiac  Consultants:  Neurology ID Cardiology Cardiothoracic surgery  Procedures:  2D echo, TEE, cardiac cath  Microbiology  Blood cultures MRSA Surveillance 2/27 - negative  Antimicrobials: Vancomycin     Objective: Vitals:   10/09/20 1358 10/09/20 1957 10/09/20 2145 10/10/20 0520  BP: 129/86 (!) 124/94 113/81 (!) 131/91  Pulse: 85  85   Resp: '17 20  17  '$ Temp:  98.5 F (36.9 C)  98.4 F (36.9 C)  TempSrc:  Oral  Oral  SpO2: 96% 99%  99%  Weight:      Height:        Intake/Output Summary (Last 24 hours) at 10/10/2020 0713 Last data filed at 10/09/2020 0800 Gross per 24 hour  Intake 360 ml  Output --  Net 360 ml   Filed Weights   10/08/20 0320 10/08/20 1650 10/09/20 0526  Weight: 77.1 kg 79.4 kg 79.9 kg    Examination:  Constitutional: NAD Eyes: lids and conjunctivae normal, no scleral icterus ENMT: mmm Neck: normal, supple Respiratory: clear to auscultation bilaterally, no wheezing, no crackles. Normal respiratory effort.  Cardiovascular: Regular rate and rhythm, no murmurs / rubs / gallops. No LE edema. Abdomen: soft, no distention, no tenderness. Bowel sounds positive.  Skin: no rashes. Right groin without swelling, significant ecchimosis Neurologic: no focal deficits, equal strength  Data Reviewed: I have independently reviewed following labs and imaging studies   CBC: Recent Labs  Lab 10/04/20 0211 10/05/20 0431 10/06/20 1529 10/07/20 0401 10/07/20 0952  10/07/20 0953 10/07/20 0956 10/08/20 0335  WBC 26.0* 20.6* 18.7* 19.1*  --   --   --  19.2*  NEUTROABS 23.9* 15.7*  --   --   --   --   --   --   HGB 8.7* 8.7* 8.1* 8.4* 9.5* 9.5* 9.2* 9.3*  HCT 25.3* 26.1* 24.5* 25.4* 28.0* 28.0* 27.0* 29.7*  MCV 80.3 82.6 83.3 83.6  --   --   --  85.6  PLT 217 230 221 270  --   --   --  [id number]   Basic Metabolic Panel: Recent Labs  Lab 10/04/20 0211 10/05/20 0431 10/06/20 1529 10/07/20 0401 10/07/20 0952 10/07/20 0953 10/07/20 0956 10/08/20 0335  NA 132* 134* 133* 133* 136 136 134* 132*  K 4.2 4.0 3.9 4.2 4.3 4.3 4.3 4.6  CL 91* 94* 93* 94*  --   --   --  93*  CO2 '23 25 22 23  '$ --   --   --  25  GLUCOSE 119* 115* 109* 102*  --   --   --  114*  BUN 93* 53*  77* 34*  --   --   --  43*  CREATININE 10.74* 7.85* 10.60* 6.39*  --   --   --  8.59*  CALCIUM 8.0* 7.6* 7.3* 7.9*  --   --   --  8.5*  MG 2.8* 2.5*  --   --   --   --   --   --   PHOS 7.0* 6.0* 8.3*  --   --   --   --   --    Liver Function Tests: Recent Labs  Lab 10/04/20 0211 10/05/20 0431 10/06/20 1529 10/07/20 0401  AST 22 25  --  33  ALT 12 13  --  16  ALKPHOS 75 70  --  57  BILITOT 1.2 1.1  --  0.8  PROT 7.4 7.6  --  7.8  ALBUMIN 2.2* 2.2* 2.1* 2.2*   Coagulation Profile: Recent Labs  Lab 10/04/20 0211  INR 1.1   HbA1C: No results for input(s): HGBA1C in the last 72 hours. CBG: No results for input(s): GLUCAP in the last 168 hours.  Recent Results (from the past 240 hour(s))  Culture, blood (routine x 2)     Status: None   Collection Time: 10/03/20  2:42 AM   Specimen: BLOOD  Result Value Ref Range Status   Specimen Description BLOOD LEFT ARM  Final   Special Requests   Final    BOTTLES DRAWN AEROBIC AND ANAEROBIC Blood Culture results may not be optimal due to an inadequate volume of blood received in culture bottles   Culture   Final    NO GROWTH 5 DAYS Performed at [facility], [place] [address], [place], [place] [address]    Report Status 10/08/2020  FINAL  Final  Culture, blood (routine x 2)     Status: None   Collection Time: 10/03/20  2:42 AM   Specimen: BLOOD  Result Value Ref Range Status   Specimen Description BLOOD LEFT HAND  Final   Special Requests   Final    BOTTLES DRAWN AEROBIC ONLY Blood Culture results may not be optimal due to an inadequate volume of blood received in culture bottles   Culture   Final    NO GROWTH 5 DAYS Performed at [facility], [place] [address] [place], [place] [address]    Report Status 10/08/2020 FINAL  Final     Radiology Studies: No results found.   Marzetta Board, MD, PhD [facility]  Between 7 am - 7 pm I am available, please contact me via Amion or Securechat  Between 7 pm - 7 am I am not available, please contact night coverage MD/APP via Amion

## 2020-10-10 NOTE — TOC Progression Note (Signed)
Transition of Care [facility]) - Progression Note    Patient Details  Name: Patrick Anthony MRN: [id number] Date of Birth: May 06, 1971  Transition of Care [facility]) CM/SW [place], [place] Phone Number: [phone] 10/10/2020, 8:45 AM  Clinical Narrative:     Patient currently does not have any bed offers.  TOC team will continue to assist with discharge planning needs.   Expected Discharge Plan: [place] Barriers to Discharge: Continued Medical Work up  Expected Discharge Plan and Services Expected Discharge Plan: [place] In-house Referral: Clinical Social Work Discharge Planning Services: CM Consult Post Acute Care Choice: [place] Living arrangements for the past 2 months: Apartment                                       Social Determinants of Health (SDOH) Interventions    Readmission Risk Interventions No flowsheet data found.

## 2020-10-11 DIAGNOSIS — I132 Hypertensive heart and chronic kidney disease with heart failure and with stage 5 chronic kidney disease, or end stage renal disease: Secondary | ICD-10-CM | POA: Diagnosis not present

## 2020-10-11 DIAGNOSIS — K045 Chronic apical periodontitis: Secondary | ICD-10-CM | POA: Diagnosis not present

## 2020-10-11 DIAGNOSIS — B9561 Methicillin susceptible Staphylococcus aureus infection as the cause of diseases classified elsewhere: Secondary | ICD-10-CM | POA: Diagnosis not present

## 2020-10-11 DIAGNOSIS — J984 Other disorders of lung: Secondary | ICD-10-CM | POA: Diagnosis not present

## 2020-10-11 DIAGNOSIS — I639 Cerebral infarction, unspecified: Secondary | ICD-10-CM | POA: Diagnosis not present

## 2020-10-11 DIAGNOSIS — K036 Deposits [accretions] on teeth: Secondary | ICD-10-CM | POA: Diagnosis not present

## 2020-10-11 DIAGNOSIS — I5032 Chronic diastolic (congestive) heart failure: Secondary | ICD-10-CM | POA: Diagnosis not present

## 2020-10-11 DIAGNOSIS — I6310 Cerebral infarction due to embolism of unspecified precerebral artery: Secondary | ICD-10-CM | POA: Diagnosis not present

## 2020-10-11 DIAGNOSIS — I330 Acute and subacute infective endocarditis: Secondary | ICD-10-CM | POA: Diagnosis not present

## 2020-10-11 DIAGNOSIS — Z992 Dependence on renal dialysis: Secondary | ICD-10-CM | POA: Diagnosis not present

## 2020-10-11 DIAGNOSIS — Q612 Polycystic kidney, adult type: Secondary | ICD-10-CM | POA: Diagnosis not present

## 2020-10-11 DIAGNOSIS — R7881 Bacteremia: Secondary | ICD-10-CM | POA: Diagnosis not present

## 2020-10-11 DIAGNOSIS — N186 End stage renal disease: Secondary | ICD-10-CM | POA: Diagnosis not present

## 2020-10-11 DIAGNOSIS — D631 Anemia in chronic kidney disease: Secondary | ICD-10-CM | POA: Diagnosis not present

## 2020-10-11 DIAGNOSIS — N2581 Secondary hyperparathyroidism of renal origin: Secondary | ICD-10-CM | POA: Diagnosis not present

## 2020-10-11 LAB — RENAL FUNCTION PANEL
Albumin: 2.1 g/dL — ABNORMAL LOW (ref 3.5–5.0)
Anion gap: 16 — ABNORMAL HIGH (ref 5–15)
BUN: 56 mg/dL — ABNORMAL HIGH (ref 6–20)
CO2: 23 mmol/L (ref 22–32)
Calcium: 8.1 mg/dL — ABNORMAL LOW (ref 8.9–10.3)
Chloride: 90 mmol/L — ABNORMAL LOW (ref 98–111)
Creatinine, Ser: 11.02 mg/dL — ABNORMAL HIGH (ref 0.61–1.24)
GFR, Estimated: 5 mL/min — ABNORMAL LOW (ref >60)
Glucose, Bld: 109 mg/dL — ABNORMAL HIGH (ref 70–99)
Phosphorus: 7.8 mg/dL — ABNORMAL HIGH (ref 2.5–4.6)
Potassium: 5 mmol/L (ref 3.5–5.1)
Sodium: 129 mmol/L — ABNORMAL LOW (ref 135–145)

## 2020-10-11 LAB — CBC
HCT: 22.8 % — ABNORMAL LOW (ref 39.0–52.0)
Hemoglobin: 7.5 g/dL — ABNORMAL LOW (ref 13.0–17.0)
MCH: 27.8 pg (ref 26.0–34.0)
MCHC: 32.9 g/dL (ref 30.0–36.0)
MCV: 84.4 fL (ref 80.0–100.0)
Platelets: 322 10*3/uL (ref 150–400)
RBC: 2.7 MIL/uL — ABNORMAL LOW (ref 4.22–5.81)
RDW: 17.1 % — ABNORMAL HIGH (ref 11.5–15.5)
WBC: 14.9 10*3/uL — ABNORMAL HIGH (ref 4.0–10.5)
nRBC: 0 % (ref 0.0–0.2)

## 2020-10-11 LAB — VANCOMYCIN, TROUGH: Vancomycin Tr: 29 ug/mL (ref 15–20)

## 2020-10-11 LAB — SURGICAL PCR SCREEN
MRSA, PCR: POSITIVE — AB
Staphylococcus aureus: POSITIVE — AB

## 2020-10-11 MED ORDER — VANCOMYCIN HCL IN DEXTROSE 500-5 MG/100ML-% IV SOLN
INTRAVENOUS | Status: AC
  Filled 2020-10-11: qty 100

## 2020-10-11 MED ORDER — VANCOMYCIN HCL IN DEXTROSE 500-5 MG/100ML-% IV SOLN
500.0000 mg | INTRAVENOUS | Status: DC
  Administered 2020-10-13: 500 mg via INTRAVENOUS
  Filled 2020-10-11: qty 100

## 2020-10-11 MED ORDER — VANCOMYCIN HCL IN DEXTROSE 750-5 MG/150ML-% IV SOLN
INTRAVENOUS | Status: AC
  Filled 2020-10-11: qty 150

## 2020-10-11 MED ORDER — CEFAZOLIN SODIUM-DEXTROSE 2-4 GM/100ML-% IV SOLN
2.0000 g | INTRAVENOUS | Status: AC
  Administered 2020-10-12: 2 g via INTRAVENOUS
  Filled 2020-10-11 (×2): qty 100

## 2020-10-11 MED ORDER — PROSOURCE PLUS PO LIQD
30.0000 mL | Freq: Two times a day (BID) | ORAL | Status: DC
  Administered 2020-10-13: 30 mL via ORAL
  Filled 2020-10-11 (×7): qty 30

## 2020-10-11 MED ORDER — DOXERCALCIFEROL 4 MCG/2ML IV SOLN
INTRAVENOUS | Status: AC
  Filled 2020-10-11: qty 2

## 2020-10-11 NOTE — Progress Notes (Signed)
CARDIAC REHAB PHASE I   PRE:  Rate/Rhythm: 88 SR    BP: lying 131/94    SaO2: 96 RA  Pt refused ambulation or moving to recliner. Attempted to encourage pt to mobilize due to coming surgery. He sts "I'll walk Thursday". Explained his surgery was Thursday. Pt very flat, minimally engaged. Could not coerce to mobilize. Discussed with him his role in OHS and need for mobilization post op. Discussed sternal precautions, IS, mobility post op and d/c planning. He practiced IS x2, 500 mL, then refused to do more. Left materials.  [id number]  [place], ACSM 10/11/2020 10:12 AM

## 2020-10-11 NOTE — Progress Notes (Signed)
TCTS BRIEF PROGRESS NOTE   Clinically stable For dental extraction tomorrow Possible MVR on Thursday  Rexene Alberts, MD 10/11/2020 10:44 AM

## 2020-10-11 NOTE — Progress Notes (Signed)
[facility]ious [facility]  Date of Admission:  09/27/2020     Total days of antibiotics 9         ASSESSMENT:  [name] is awaiting dental extraction tomorrow and possibly mitral valve replacement on Thursday. Continues to receive Vancomycin with dialysis for disseminated MRSA infection with sparing of the AVF. Continue vancomycin with duration of therapy pending planned surgical interventions.   PLAN:  1. Continue vancomycin. 2. Planned dental extraction tomorrow and possible mitral valve replacement Thursday. 3. Dialysis per nephrology.   Principal Problem:   Bacterial endocarditis Active Problems:   Chronic combined systolic and diastolic congestive heart failure (HCC)   HTN (hypertension)   Anemia in chronic kidney disease   ESRD on dialysis ([place])   End stage renal disease (HCC)   Sepsis ([place])   Pneumonia due to infectious agent   ADPKD (autosomal dominant polycystic kidney disease)   Sepsis due to pneumonia (HCC)   Lactic acidosis   Cerebral embolism with cerebral infarction   Bacteremia due to Staphylococcus aureus   Mitral regurgitation   Uncontrolled hypertension   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   Patent foramen ovale   Dental caries   Chronic apical periodontitis   Chronic periodontitis   Accretions on teeth   . (feeding supplement) PROSource Plus  30 mL Oral BID BM  . amLODipine  10 mg Oral Daily  . calcium acetate  1,334 mg Oral TID WC  . calcium acetate  667 mg Oral With snacks  . Chlorhexidine Gluconate Cloth  6 each Topical Q0600  . Chlorhexidine Gluconate Cloth  6 each Topical Q0600  . cinacalcet  30 mg Oral Q breakfast  . darbepoetin (ARANESP) injection - DIALYSIS  100 mcg Intravenous Q Wed-HD  . doxercalciferol  3 mcg Intravenous Q M,W,F-HD  . feeding supplement (NEPRO CARB STEADY)  237 mL Oral BID BM  . hydrALAZINE  50 mg Oral BID  . influenza vac split quadrivalent PF  0.5 mL Intramuscular Tomorrow-1000  . metoprolol  tartrate  75 mg Oral BID  . multivitamin  1 tablet Oral QHS  . sodium chloride flush  3 mL Intravenous Q12H    SUBJECTIVE:  Afebrile overnight with no acute events. Feeling fatigued at times.   No Known Allergies   Review of Systems: Review of Systems  Constitutional: Positive for malaise/fatigue. Negative for chills, fever and weight loss.  Respiratory: Negative for cough, shortness of breath and wheezing.   Cardiovascular: Negative for chest pain and leg swelling.  Gastrointestinal: Negative for abdominal pain, constipation, diarrhea, nausea and vomiting.  Skin: Negative for rash.      OBJECTIVE: Vitals:   10/11/20 0713 10/11/20 1150 10/11/20 1158 10/11/20 1200  BP: 124/89 130/88 129/80 129/88  Pulse: 86 95 94   Resp: 18 18  (!) 23  Temp: 98.8 F (37.1 C) 99.1 F (37.3 C)    TempSrc: Oral Axillary    SpO2:  100%    Weight:  75.6 kg    Height:       Body mass index is 22.6 kg/m.  Physical Exam Constitutional:      General: He is not in acute distress.    Appearance: He is well-developed and well-nourished.  Cardiovascular:     Rate and Rhythm: Normal rate and regular rhythm.     Pulses: Intact distal pulses.     Heart sounds: Normal heart sounds.  Pulmonary:     Effort: Pulmonary effort is normal.  Breath sounds: Normal breath sounds.  Skin:    General: Skin is warm and dry.  Neurological:     Mental Status: He is alert.  Psychiatric:        Mood and Affect: Mood and affect normal.     Lab Results Lab Results  Component Value Date   WBC 19.2 (H) 10/08/2020   HGB 9.3 (L) 10/08/2020   HCT 29.7 (L) 10/08/2020   MCV 85.6 10/08/2020   PLT 295 10/08/2020    Lab Results  Component Value Date   CREATININE 8.59 (H) 10/08/2020   BUN 43 (H) 10/08/2020   NA 132 (L) 10/08/2020   K 4.6 10/08/2020   CL 93 (L) 10/08/2020   CO2 25 10/08/2020    Lab Results  Component Value Date   ALT 16 10/07/2020   AST 33 10/07/2020   ALKPHOS 57 10/07/2020    BILITOT 0.8 10/07/2020     Microbiology: Recent Results (from the past 240 hour(s))  Culture, blood (routine x 2)     Status: None   Collection Time: 10/03/20  2:42 AM   Specimen: BLOOD  Result Value Ref Range Status   Specimen Description BLOOD LEFT ARM  Final   Special Requests   Final    BOTTLES DRAWN AEROBIC AND ANAEROBIC Blood Culture results may not be optimal due to an inadequate volume of blood received in culture bottles   Culture   Final    NO GROWTH 5 DAYS Performed at [facility], [place] [address] [place], [place] [address]    Report Status 10/08/2020 FINAL  Final  Culture, blood (routine x 2)     Status: None   Collection Time: 10/03/20  2:42 AM   Specimen: BLOOD  Result Value Ref Range Status   Specimen Description BLOOD LEFT HAND  Final   Special Requests   Final    BOTTLES DRAWN AEROBIC ONLY Blood Culture results may not be optimal due to an inadequate volume of blood received in culture bottles   Culture   Final    NO GROWTH 5 DAYS Performed at [facility], [place] [address] [place], [place] [address]    Report Status 10/08/2020 FINAL  Final     Terri Piedra, NP [facility]  10/11/2020  12:19 PM

## 2020-10-11 NOTE — Progress Notes (Signed)
PT Cancellation Note  Patient Details Name: [name] MRN: [id number] DOB: [birth year]   Cancelled Treatment:    Reason Eval/Treat Not Completed: Patient at procedure or test/unavailable - at HD, will check back as schedule allows.  Stacie Glaze, PT Acute Rehabilitation Services Pager [phone]  Office [phone]    Louis Matte 10/11/2020, 12:03 PM

## 2020-10-11 NOTE — Plan of Care (Signed)
  Problem: Activity: Goal: Ability to return to baseline activity level will improve Outcome: Progressing   

## 2020-10-11 NOTE — Progress Notes (Signed)
PROGRESS NOTE  Asai Vantrease [id number] DOB: 1971/03/04 DOA: 09/27/2020 PCP: [name], MD   LOS: 14 days   Brief Narrative / Interim history: 50 year old male with medical history of end-stage renal disease due to APKD on hemodialysis MWF through left upper extremity AV graft, hypertension, OSA, chronic diastolic CHF, EF [id number], liver cirrhosis due to hepatitis C, history of alcohol abuse came to hospital with generalized weakness.  He was admitted with sepsis due to suspected pneumonia and started on antibiotics.  Code stroke was called on hospital day 1 and imaging showed embolic strokes.  His blood cultures were positive for staph aureus and he's had TEE on 3/1 which is concerning for mitral valve endocarditis.  Currently on vancomycin.  ID, neurology, cardiology, and CT surgery are following.     Subjective / 24h Interval events: No shortness of breath, no chest pain, no nausea/vomiting this morning   Assessment & Plan: Principal Problem Mitral valve endocarditis, MRSA bacteremia-ID is following, patient has been started on vancomycin.  Surveillance blood cultures obtained on 2/27 after initiation of antibiotics remain negative.  CT scan of the abdomen and pelvis on 2/23 with cavitary mass in the left upper lobe, possibly septic embolus, as well as small foci of consolidation and groundglass density in the right upper lobe and small groundglass nodule in the left upper lobe (infectious versus inflammatory), CT also with polycystic kidney disease.  -Cardiothoracic surgery consulting, he is a surgical candidate, underwent cardiac cath 3/3 with clean coronaries.  Cardiothoracic surgery plans valve repair in 3 days.  Dental to take the OR tomorrow also for several decayed teeth extraction  Active Problems New onset aphasia, scattered small acute infarcts in the bilateral cerebral hemispheres, right basal ganglia, callosal splenium, bilateral cerebral hemispheres concerning for embolic  process -likely due to endocarditis.  2D echo showed EF 55-60%, grade 2 DD.  An MRI of the head and neck without appreciable stenosis.  Acute metabolic encephalopathy-improving  Seizure-like activity upper extremities-myoclonus suspected, hold gabapentin.  EEG negative  ESRD due to adult polycystic kidney disease-continue dialysis per nephrology  Iron deficiency anemia, anemia of chronic disease-monitor hemoglobin  Essential hypertension-continue regimen as below  Liver cirrhosis, history of hep C-monitor  Scheduled Meds: . amLODipine  10 mg Oral Daily  . calcium acetate  1,334 mg Oral TID WC  . calcium acetate  667 mg Oral With snacks  . Chlorhexidine Gluconate Cloth  6 each Topical Q0600  . Chlorhexidine Gluconate Cloth  6 each Topical Q0600  . cinacalcet  30 mg Oral Q breakfast  . darbepoetin (ARANESP) injection - DIALYSIS  100 mcg Intravenous Q Wed-HD  . doxercalciferol  3 mcg Intravenous Q M,W,F-HD  . feeding supplement (NEPRO CARB STEADY)  237 mL Oral BID BM  . hydrALAZINE  50 mg Oral BID  . influenza vac split quadrivalent PF  0.5 mL Intramuscular Tomorrow-1000  . metoprolol tartrate  75 mg Oral BID  . multivitamin  1 tablet Oral QHS  . sodium chloride flush  3 mL Intravenous Q12H   Continuous Infusions: . sodium chloride    . vancomycin Stopped (10/08/20 2143)   PRN Meds:.sodium chloride, acetaminophen, albuterol, HYDROmorphone (DILAUDID) injection, morphine injection, ondansetron (ZOFRAN) IV, ondansetron **OR** [DISCONTINUED] ondansetron (ZOFRAN) IV, oxyCODONE, polyethylene glycol, sodium chloride flush  Diet Orders (From admission, onward)    Start     Ordered   10/07/20 1502  Diet renal with fluid restriction Fluid restriction: 1200 mL Fluid; Room service appropriate? Yes; Fluid consistency: Thin  Diet effective now       Question Answer Comment  Fluid restriction: 1200 mL Fluid   Room service appropriate? Yes   Fluid consistency: Thin      10/07/20 1501           DVT prophylaxis: SCD's Start: 09/28/20 1041     Code Status: Full Code  Family Communication: no family at bedside  Status is: Inpatient  Remains inpatient appropriate because:Inpatient level of care appropriate due to severity of illness   Dispo: The patient is from: Home              Anticipated d/c is to: SNF              Patient currently is not medically stable to d/c.   Difficult to place patient No  Level of care: Progressive Cardiac  Consultants:  Neurology ID Cardiology Cardiothoracic surgery  Procedures:  2D echo, TEE, cardiac cath  Microbiology  Blood cultures MRSA Surveillance 2/27 - negative  Antimicrobials: Vancomycin     Objective: Vitals:   10/10/20 1953 10/10/20 2252 10/11/20 0700 10/11/20 0713  BP: (!) 131/92 (!) 136/96  124/89  Pulse: 95   86  Resp: 18   18  Temp: 99.6 F (37.6 C)   98.8 F (37.1 C)  TempSrc: Oral   Oral  SpO2:      Weight:   71.8 kg   Height:        Intake/Output Summary (Last 24 hours) at 10/11/2020 0947 Last data filed at 10/10/2020 1500 Gross per 24 hour  Intake 600 ml  Output -  Net 600 ml   Filed Weights   10/08/20 1650 10/09/20 0526 10/11/20 0700  Weight: 79.4 kg 79.9 kg 71.8 kg    Examination:  Constitutional: NAD Respiratory: cta biL, no wheezing, no crackles  Cardiovascular: rrr, 3/6 SEM, no edema   Data Reviewed: I have independently reviewed following labs and imaging studies   CBC: Recent Labs  Lab 10/05/20 0431 10/06/20 1529 10/07/20 0401 10/07/20 0952 10/07/20 0953 10/07/20 0956 10/08/20 0335  WBC 20.6* 18.7* 19.1*  --   --   --  19.2*  NEUTROABS 15.7*  --   --   --   --   --   --   HGB 8.7* 8.1* 8.4* 9.5* 9.5* 9.2* 9.3*  HCT 26.1* 24.5* 25.4* 28.0* 28.0* 27.0* 29.7*  MCV 82.6 83.3 83.6  --   --   --  85.6  PLT 230 221 270  --   --   --  [id number]   Basic Metabolic Panel: Recent Labs  Lab 10/05/20 0431 10/06/20 1529 10/07/20 0401 10/07/20 0952 10/07/20 0953 10/07/20 0956  10/08/20 0335  NA 134* 133* 133* 136 136 134* 132*  K 4.0 3.9 4.2 4.3 4.3 4.3 4.6  CL 94* 93* 94*  --   --   --  93*  CO2 '25 22 23  '$ --   --   --  25  GLUCOSE 115* 109* 102*  --   --   --  114*  BUN 53* 77* 34*  --   --   --  43*  CREATININE 7.85* 10.60* 6.39*  --   --   --  8.59*  CALCIUM 7.6* 7.3* 7.9*  --   --   --  8.5*  MG 2.5*  --   --   --   --   --   --   PHOS 6.0* 8.3*  --   --   --   --   --  Liver Function Tests: Recent Labs  Lab 10/05/20 0431 10/06/20 1529 10/07/20 0401  AST 25  --  33  ALT 13  --  16  ALKPHOS 70  --  57  BILITOT 1.1  --  0.8  PROT 7.6  --  7.8  ALBUMIN 2.2* 2.1* 2.2*   Coagulation Profile: No results for input(s): INR, PROTIME in the last 168 hours. HbA1C: No results for input(s): HGBA1C in the last 72 hours. CBG: No results for input(s): GLUCAP in the last 168 hours.  Recent Results (from the past 240 hour(s))  Culture, blood (routine x 2)     Status: None   Collection Time: 10/03/20  2:42 AM   Specimen: BLOOD  Result Value Ref Range Status   Specimen Description BLOOD LEFT ARM  Final   Special Requests   Final    BOTTLES DRAWN AEROBIC AND ANAEROBIC Blood Culture results may not be optimal due to an inadequate volume of blood received in culture bottles   Culture   Final    NO GROWTH 5 DAYS Performed at [facility], [place] [address], [place], [place] [address]    Report Status 10/08/2020 FINAL  Final  Culture, blood (routine x 2)     Status: None   Collection Time: 10/03/20  2:42 AM   Specimen: BLOOD  Result Value Ref Range Status   Specimen Description BLOOD LEFT HAND  Final   Special Requests   Final    BOTTLES DRAWN AEROBIC ONLY Blood Culture results may not be optimal due to an inadequate volume of blood received in culture bottles   Culture   Final    NO GROWTH 5 DAYS Performed at [facility], [place] [address] [place], [place] [address]    Report Status 10/08/2020 FINAL  Final     Radiology Studies: No  results found.   Marzetta Board, MD, PhD [facility]  Between 7 am - 7 pm I am available, please contact me via Amion or Securechat  Between 7 pm - 7 am I am not available, please contact night coverage MD/APP via Amion

## 2020-10-11 NOTE — Progress Notes (Signed)
Pharmacy Antibiotic Note  Patrick Anthony is a 50 y.o. male admitted on 09/27/2020 with fever, chills, and generalized weakness.  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  Patient continues on Vancomycin per pharmacy.  Pt has ESRD with HD MWF.  Plans are for 6 weeks of antibiotics with day 1 being post-op day 1 from valve surgery (surgery planned for Thursday) -Pre-HD vancomycin level= 29   Plan: -Change  Vancomycin to 500 mg/HD-MWF - Will continue to follow HD schedule/duration  Height: 6' (182.9 cm) Weight: 75.6 kg (166 lb 10.7 oz) IBW/kg (Calculated) : 77.6  Temp (24hrs), Avg:99.2 F (37.3 C), Min:98.8 F (37.1 C), Max:99.6 F (37.6 C)  Recent Labs  Lab 10/05/20 0431 10/06/20 1529 10/07/20 0401 10/08/20 0335 10/11/20 1203  WBC 20.6* 18.7* 19.1* 19.2* 14.9*  CREATININE 7.85* 10.60* 6.39* 8.59* 11.02*  VANCOTROUGH  --   --   --   --  29*    Estimated Creatinine Clearance: 8.7 mL/min (A) (by C-G formula based on SCr of 11.02 mg/dL (H)).    No Known Allergies  Antimicrobials this admission: Vanc 2/21>> Cefepime 2/21>>2/22 Flagyl 2/21>>2/22 (2 doses given)  Dose adjustments this admission: 2/28 Pre-HD VR 41 - estimate post HD level 27 Dose held 2/28 and reduced to '750mg'$  IV qHD starting 3/2  Microbiology results: 2/21 blood x 2: MRSA, sens Vanc MIC < 0.5, Septra, TCN, gent MIC < 0.5, Clinda 2/22 BCID: MRSA  2/21 COVID and flu: negative 2/21 HIV: non-reactive 2/24 blood x 2: 1/2 SA 2/27 blood x 2: neg  Thank you for allowing pharmacy to be a part of this patient's care.  Hildred Laser, PharmD Clinical Pharmacist **Pharmacist phone directory can now be found on [email or web address] ([email or web address]).  Listed under [place].

## 2020-10-11 NOTE — Progress Notes (Signed)
   10/11/20 2035  Assess: MEWS Score  Temp (!) 100.6 F (38.1 C)  BP (!) 154/91  Pulse Rate (!) 119  ECG Heart Rate (!) 121  Resp 20  Assess: MEWS Score  MEWS Temp 1  MEWS Systolic 0  MEWS Pulse 2  MEWS RR 0  MEWS LOC 0  MEWS Score 3  MEWS Score Color Yellow  Assess: if the MEWS score is Yellow or Red  Were vital signs taken at a resting state? Yes  Focused Assessment No change from prior assessment  Early Detection of Sepsis Score *See Row Information* Low  MEWS guidelines implemented *See Row Information* Yes  Treat  MEWS Interventions Escalated (See documentation below)  Pain Score 0  Take Vital Signs  Increase Vital Sign Frequency  Yellow: Q 2hr X 2 then Q 4hr X 2, if remains yellow, continue Q 4hrs  Escalate  MEWS: Escalate Yellow: discuss with charge nurse/RN and consider discussing with provider and RRT  Notify: Charge Nurse/RN  Name of Charge Nurse/RN Notified Scientist, forensic  Date Charge Nurse/RN Notified 10/11/20  Time Charge Nurse/RN Notified 2044

## 2020-10-11 NOTE — Plan of Care (Signed)
  Problem: Nutrition: Goal: Dietary intake will improve Outcome: Progressing   Problem: Activity: Goal: Ability to return to baseline activity level will improve Outcome: Progressing

## 2020-10-11 NOTE — Progress Notes (Addendum)
[facility] Progress Note   Subjective: Very quiet. No C/Os. HD today on schedule.   Objective Vitals:   10/10/20 1953 10/10/20 2252 10/11/20 0700 10/11/20 0713  BP: (!) 131/92 (!) 136/96  124/89  Pulse: 95   86  Resp: 18   18  Temp: 99.6 F (37.6 C)   98.8 F (37.1 C)  TempSrc: Oral   Oral  SpO2:      Weight:   71.8 kg   Height:       Physical Exam General: Chronically ill appearing male in NAD Heart: [id number] 3/6 systolic M.  Lungs: CTAB Abdomen: Active BS. S, NT Extremities: No LE Edema.  Dialysis Access: L AVG + Bruit   Additional Objective Labs: Basic Metabolic Panel: Recent Labs  Lab 10/05/20 0431 10/06/20 1529 10/07/20 0401 10/07/20 0952 10/07/20 0953 10/07/20 0956 10/08/20 0335  NA 134* 133* 133*   < > 136 134* 132*  K 4.0 3.9 4.2   < > 4.3 4.3 4.6  CL 94* 93* 94*  --   --   --  93*  CO2 '25 22 23  '$ --   --   --  25  GLUCOSE 115* 109* 102*  --   --   --  114*  BUN 53* 77* 34*  --   --   --  43*  CREATININE 7.85* 10.60* 6.39*  --   --   --  8.59*  CALCIUM 7.6* 7.3* 7.9*  --   --   --  8.5*  PHOS 6.0* 8.3*  --   --   --   --   --    < > = values in this interval not displayed.   Liver Function Tests: Recent Labs  Lab 10/05/20 0431 10/06/20 1529 10/07/20 0401  AST 25  --  33  ALT 13  --  16  ALKPHOS 70  --  57  BILITOT 1.1  --  0.8  PROT 7.6  --  7.8  ALBUMIN 2.2* 2.1* 2.2*   No results for input(s): LIPASE, AMYLASE in the last 168 hours. CBC: Recent Labs  Lab 10/05/20 0431 10/06/20 1529 10/07/20 0401 10/07/20 0952 10/07/20 0953 10/07/20 0956 10/08/20 0335  WBC 20.6* 18.7* 19.1*  --   --   --  19.2*  NEUTROABS 15.7*  --   --   --   --   --   --   HGB 8.7* 8.1* 8.4*   < > 9.5* 9.2* 9.3*  HCT 26.1* 24.5* 25.4*   < > 28.0* 27.0* 29.7*  MCV 82.6 83.3 83.6  --   --   --  85.6  PLT 230 221 270  --   --   --  295   < > = values in this interval not displayed.   Blood Culture    Component Value Date/Time   SDES BLOOD LEFT  ARM 10/03/2020 0242   SDES BLOOD LEFT HAND 10/03/2020 0242   SPECREQUEST  10/03/2020 0242    BOTTLES DRAWN AEROBIC AND ANAEROBIC Blood Culture results may not be optimal due to an inadequate volume of blood received in culture bottles   SPECREQUEST  10/03/2020 0242    BOTTLES DRAWN AEROBIC ONLY Blood Culture results may not be optimal due to an inadequate volume of blood received in culture bottles   CULT  10/03/2020 0242    NO GROWTH 5 DAYS Performed at [facility], [place] [address] [place], [place] [address]    CULT  10/03/2020 0242    NO GROWTH 5 DAYS Performed at [facility], [place] [address] [place], [place] [address]    REPTSTATUS 10/08/2020 FINAL 10/03/2020 0242   REPTSTATUS 10/08/2020 FINAL 10/03/2020 0242    Cardiac Enzymes: No results for input(s): CKTOTAL, CKMB, CKMBINDEX, TROPONINI in the last 168 hours. CBG: No results for input(s): GLUCAP in the last 168 hours. Iron Studies: No results for input(s): IRON, TIBC, TRANSFERRIN, FERRITIN in the last 72 hours. '@lablastinr3'$ @ Studies/Results: No results found. Medications: . sodium chloride    . vancomycin Stopped (10/08/20 2143)   . amLODipine  10 mg Oral Daily  . calcium acetate  1,334 mg Oral TID WC  . calcium acetate  667 mg Oral With snacks  . Chlorhexidine Gluconate Cloth  6 each Topical Q0600  . Chlorhexidine Gluconate Cloth  6 each Topical Q0600  . cinacalcet  30 mg Oral Q breakfast  . darbepoetin (ARANESP) injection - DIALYSIS  100 mcg Intravenous Q Wed-HD  . doxercalciferol  3 mcg Intravenous Q M,W,F-HD  . feeding supplement (NEPRO CARB STEADY)  237 mL Oral BID BM  . hydrALAZINE  50 mg Oral BID  . influenza vac split quadrivalent PF  0.5 mL Intramuscular Tomorrow-1000  . metoprolol tartrate  75 mg Oral BID  . multivitamin  1 tablet Oral QHS  . sodium chloride flush  3 mL Intravenous Q12H     OP HD:MWF [place] 4h 350/500 82.5kg 2/2.5 bath P1 Hep 5500 LUA AVG Aranesp 71mg q2wks -  last 09/27/20 Hectorol357m IV qHD   Assessment/Plan: 1. MRSA bacteremia/ MV endocarditis: w/ fevers and AMS/ embolic CVA's. [facility].  Cavitary mass LUL possibly septic emboli. ID following on Vancomycin.  Has L AVG w/o overt signs of infection. Seen by Dr. OwRoxy Mannstentative  plan valve repair 10/14/20 after tooth extraction 10/12/20. Per Primary.  2. 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 AMS -Resolved.  4. ESRD - usual HD MWF. HD today on schedule. K+ 4.6  5. Hypertension/volume-BP controlled. H/O resistant hypertension and medical noncompliance, historically difficult to control. Hehas beenon 4 antihypertensive medications at home. (amlodipine 10 mg, clonidine transdermal, hydralazine 50 mg PO BID, metoprolol 75 mg PO BID). BP lower than normal here probably due to medications being given to him. Clonidine DC'd. Now under OP EDW. UF as tolerated and lower EDW on discharge.   6. Anemiaof CKD- Last Hgb 9.3 (10/08/20). Last esa at OP HD was 2/21,Aranesphere 100 weeklylast on 3/02. Iron sat low, holding iron D/T active infection.   7. Secondary Hyperparathyroidism -Ca in goal, phos8.3up. Continue hectorol, sensipar and phoslo. 8. Nutrition- Renal diet w/fluid restrictionon Nepro Albumin 2.2 Add protein supps.  9. Hx Hep C /liver cirrhosis.  Adriano Bischof H. Ozil Stettler NP-C 10/11/2020, 10:40 AM  [phone]

## 2020-10-12 ENCOUNTER — Inpatient Hospital Stay (HOSPITAL_COMMUNITY): Payer: Medicaid Other | Admitting: Anesthesiology

## 2020-10-12 ENCOUNTER — Encounter (HOSPITAL_COMMUNITY)
Admission: EM | Disposition: A | Discharge status: Skilled Nursing Facility | Payer: Self-pay | Source: Home / Self Care | Attending: Internal Medicine

## 2020-10-12 ENCOUNTER — Encounter (HOSPITAL_COMMUNITY): Payer: Self-pay | Admitting: Internal Medicine

## 2020-10-12 DIAGNOSIS — Z20822 Contact with and (suspected) exposure to covid-19: Secondary | ICD-10-CM | POA: Diagnosis not present

## 2020-10-12 DIAGNOSIS — J984 Other disorders of lung: Secondary | ICD-10-CM | POA: Diagnosis not present

## 2020-10-12 DIAGNOSIS — Q612 Polycystic kidney, adult type: Secondary | ICD-10-CM | POA: Diagnosis not present

## 2020-10-12 DIAGNOSIS — Z992 Dependence on renal dialysis: Secondary | ICD-10-CM | POA: Diagnosis not present

## 2020-10-12 DIAGNOSIS — K045 Chronic apical periodontitis: Secondary | ICD-10-CM | POA: Diagnosis not present

## 2020-10-12 DIAGNOSIS — K029 Dental caries, unspecified: Secondary | ICD-10-CM | POA: Diagnosis not present

## 2020-10-12 DIAGNOSIS — R7881 Bacteremia: Secondary | ICD-10-CM | POA: Diagnosis not present

## 2020-10-12 DIAGNOSIS — I10 Essential (primary) hypertension: Secondary | ICD-10-CM | POA: Diagnosis not present

## 2020-10-12 DIAGNOSIS — I132 Hypertensive heart and chronic kidney disease with heart failure and with stage 5 chronic kidney disease, or end stage renal disease: Secondary | ICD-10-CM | POA: Diagnosis not present

## 2020-10-12 DIAGNOSIS — I5042 Chronic combined systolic (congestive) and diastolic (congestive) heart failure: Secondary | ICD-10-CM | POA: Diagnosis not present

## 2020-10-12 DIAGNOSIS — N2581 Secondary hyperparathyroidism of renal origin: Secondary | ICD-10-CM | POA: Diagnosis not present

## 2020-10-12 DIAGNOSIS — K036 Deposits [accretions] on teeth: Secondary | ICD-10-CM | POA: Diagnosis not present

## 2020-10-12 DIAGNOSIS — Z9889 Other specified postprocedural states: Secondary | ICD-10-CM | POA: Diagnosis not present

## 2020-10-12 DIAGNOSIS — R29716 NIHSS score 16: Secondary | ICD-10-CM | POA: Diagnosis not present

## 2020-10-12 DIAGNOSIS — I330 Acute and subacute infective endocarditis: Secondary | ICD-10-CM | POA: Diagnosis not present

## 2020-10-12 DIAGNOSIS — R29718 NIHSS score 18: Secondary | ICD-10-CM | POA: Diagnosis not present

## 2020-10-12 DIAGNOSIS — K0530 Chronic periodontitis, unspecified: Secondary | ICD-10-CM

## 2020-10-12 DIAGNOSIS — N186 End stage renal disease: Secondary | ICD-10-CM | POA: Diagnosis not present

## 2020-10-12 DIAGNOSIS — R29711 NIHSS score 11: Secondary | ICD-10-CM | POA: Diagnosis not present

## 2020-10-12 DIAGNOSIS — B9561 Methicillin susceptible Staphylococcus aureus infection as the cause of diseases classified elsewhere: Secondary | ICD-10-CM | POA: Diagnosis not present

## 2020-10-12 DIAGNOSIS — I6310 Cerebral infarction due to embolism of unspecified precerebral artery: Secondary | ICD-10-CM | POA: Diagnosis not present

## 2020-10-12 DIAGNOSIS — A4102 Sepsis due to Methicillin resistant Staphylococcus aureus: Secondary | ICD-10-CM | POA: Diagnosis not present

## 2020-10-12 DIAGNOSIS — D631 Anemia in chronic kidney disease: Secondary | ICD-10-CM | POA: Diagnosis not present

## 2020-10-12 DIAGNOSIS — I639 Cerebral infarction, unspecified: Secondary | ICD-10-CM | POA: Diagnosis not present

## 2020-10-12 DIAGNOSIS — I6340 Cerebral infarction due to embolism of unspecified cerebral artery: Secondary | ICD-10-CM | POA: Diagnosis not present

## 2020-10-12 DIAGNOSIS — G928 Other toxic encephalopathy: Secondary | ICD-10-CM | POA: Diagnosis not present

## 2020-10-12 DIAGNOSIS — J189 Pneumonia, unspecified organism: Secondary | ICD-10-CM | POA: Diagnosis not present

## 2020-10-12 DIAGNOSIS — R29712 NIHSS score 12: Secondary | ICD-10-CM | POA: Diagnosis not present

## 2020-10-12 DIAGNOSIS — I2690 Septic pulmonary embolism without acute cor pulmonale: Secondary | ICD-10-CM | POA: Diagnosis not present

## 2020-10-12 DIAGNOSIS — I5032 Chronic diastolic (congestive) heart failure: Secondary | ICD-10-CM | POA: Diagnosis not present

## 2020-10-12 HISTORY — PX: MULTIPLE EXTRACTIONS WITH ALVEOLOPLASTY: SHX5342

## 2020-10-12 LAB — POCT I-STAT, CHEM 8
BUN: 34 mg/dL — ABNORMAL HIGH (ref 6–20)
Calcium, Ion: 0.99 mmol/L — ABNORMAL LOW (ref 1.15–1.40)
Chloride: 92 mmol/L — ABNORMAL LOW (ref 98–111)
Creatinine, Ser: 7 mg/dL — ABNORMAL HIGH (ref 0.61–1.24)
Glucose, Bld: 93 mg/dL (ref 70–99)
HCT: 27 % — ABNORMAL LOW (ref 39.0–52.0)
Hemoglobin: 9.2 g/dL — ABNORMAL LOW (ref 13.0–17.0)
Potassium: 4.9 mmol/L (ref 3.5–5.1)
Sodium: 131 mmol/L — ABNORMAL LOW (ref 135–145)
TCO2: 29 mmol/L (ref 22–32)

## 2020-10-12 SURGERY — MULTIPLE EXTRACTION WITH ALVEOLOPLASTY
Anesthesia: General | Site: Mouth

## 2020-10-12 MED ORDER — MIDAZOLAM HCL 2 MG/2ML IJ SOLN
INTRAMUSCULAR | Status: AC
  Filled 2020-10-12: qty 2

## 2020-10-12 MED ORDER — SODIUM CHLORIDE 0.9 % IV SOLN: INTRAVENOUS | Status: DC | PRN

## 2020-10-12 MED ORDER — HYDRALAZINE HCL 25 MG PO TABS
25.0000 mg | ORAL_TABLET | Freq: Two times a day (BID) | ORAL | Status: DC
  Administered 2020-10-12 – 2020-10-13 (×2): 25 mg via ORAL
  Filled 2020-10-12 (×3): qty 1

## 2020-10-12 MED ORDER — LACTATED RINGERS IV SOLN: INTRAVENOUS | Status: DC

## 2020-10-12 MED ORDER — PROPOFOL 10 MG/ML IV BOLUS
INTRAVENOUS | Status: AC
  Filled 2020-10-12: qty 20

## 2020-10-12 MED ORDER — FENTANYL CITRATE (PF) 100 MCG/2ML IJ SOLN: 25.0000 ug | INTRAMUSCULAR | Status: DC | PRN

## 2020-10-12 MED ORDER — STERILE WATER FOR IRRIGATION IR SOLN
Status: DC | PRN
  Administered 2020-10-12: 1000 mL

## 2020-10-12 MED ORDER — PROPOFOL 10 MG/ML IV BOLUS
INTRAVENOUS | Status: DC | PRN
  Administered 2020-10-12: 20 mg via INTRAVENOUS
  Administered 2020-10-12: 130 mg via INTRAVENOUS

## 2020-10-12 MED ORDER — ROCURONIUM BROMIDE 10 MG/ML (PF) SYRINGE
PREFILLED_SYRINGE | INTRAVENOUS | Status: DC | PRN
  Administered 2020-10-12: 50 mg via INTRAVENOUS

## 2020-10-12 MED ORDER — GLYCOPYRROLATE PF 0.2 MG/ML IJ SOSY
PREFILLED_SYRINGE | INTRAMUSCULAR | Status: DC | PRN
  Administered 2020-10-12: .4 mg via INTRAVENOUS

## 2020-10-12 MED ORDER — OXYCODONE HCL 5 MG PO TABS: 5.0000 mg | ORAL_TABLET | Freq: Once | ORAL | Status: DC | PRN

## 2020-10-12 MED ORDER — ONDANSETRON HCL 4 MG/2ML IJ SOLN
INTRAMUSCULAR | Status: DC | PRN
  Administered 2020-10-12: 4 mg via INTRAVENOUS

## 2020-10-12 MED ORDER — OXYCODONE HCL 5 MG/5ML PO SOLN: 5.0000 mg | Freq: Once | ORAL | Status: DC | PRN

## 2020-10-12 MED ORDER — CHLORHEXIDINE GLUCONATE 0.12 % MT SOLN
15.0000 mL | Freq: Once | OROMUCOSAL | Status: AC
  Administered 2020-10-12: 15 mL via OROMUCOSAL
  Filled 2020-10-12: qty 15

## 2020-10-12 MED ORDER — LIDOCAINE-EPINEPHRINE 1 %-1:100000 IJ SOLN
INTRAMUSCULAR | Status: AC
  Filled 2020-10-12: qty 1

## 2020-10-12 MED ORDER — PROMETHAZINE HCL 25 MG/ML IJ SOLN: 6.2500 mg | INTRAMUSCULAR | Status: DC | PRN

## 2020-10-12 MED ORDER — LIDOCAINE 2% (20 MG/ML) 5 ML SYRINGE
INTRAMUSCULAR | Status: DC | PRN
  Administered 2020-10-12: 100 mg via INTRAVENOUS

## 2020-10-12 MED ORDER — CHLORHEXIDINE GLUCONATE CLOTH 2 % EX PADS
6.0000 | MEDICATED_PAD | Freq: Every day | CUTANEOUS | Status: DC
  Administered 2020-10-12 – 2020-10-13 (×2): 6 via TOPICAL

## 2020-10-12 MED ORDER — 0.9 % SODIUM CHLORIDE (POUR BTL) OPTIME
TOPICAL | Status: DC | PRN
  Administered 2020-10-12: 1000 mL

## 2020-10-12 MED ORDER — FENTANYL CITRATE (PF) 250 MCG/5ML IJ SOLN
INTRAMUSCULAR | Status: AC
  Filled 2020-10-12: qty 5

## 2020-10-12 MED ORDER — MUPIROCIN 2 % EX OINT
1.0000 "application " | TOPICAL_OINTMENT | Freq: Two times a day (BID) | CUTANEOUS | Status: DC
  Administered 2020-10-12 – 2020-10-13 (×4): 1 via NASAL
  Filled 2020-10-12 (×2): qty 22

## 2020-10-12 MED ORDER — NEOSTIGMINE METHYLSULFATE 5 MG/5ML IV SOSY
PREFILLED_SYRINGE | INTRAVENOUS | Status: DC | PRN
  Administered 2020-10-12: 3 mg via INTRAVENOUS

## 2020-10-12 MED ORDER — PHENYLEPHRINE HCL (PRESSORS) 10 MG/ML IV SOLN
INTRAVENOUS | Status: DC | PRN
  Administered 2020-10-12: 100 ug via INTRAVENOUS

## 2020-10-12 MED ORDER — SODIUM CHLORIDE 0.9 % IV SOLN: INTRAVENOUS | Status: DC

## 2020-10-12 MED ORDER — ALBUMIN HUMAN 5 % IV SOLN: INTRAVENOUS | Status: DC | PRN

## 2020-10-12 MED ORDER — LIDOCAINE-EPINEPHRINE 1 %-1:100000 IJ SOLN
INTRAMUSCULAR | Status: DC | PRN
  Administered 2020-10-12: 4 mL

## 2020-10-12 MED ORDER — ACETAMINOPHEN 500 MG PO TABS: 1000.0000 mg | ORAL_TABLET | Freq: Once | ORAL | Status: DC

## 2020-10-12 MED ORDER — FENTANYL CITRATE (PF) 100 MCG/2ML IJ SOLN
INTRAMUSCULAR | Status: DC | PRN
  Administered 2020-10-12: 50 ug via INTRAVENOUS

## 2020-10-12 SURGICAL SUPPLY — 30 items
BLADE SURG 15 STRL LF DISP TIS (BLADE) ×1 IMPLANT
BLADE SURG 15 STRL SS (BLADE) ×1
COVER SURGICAL LIGHT HANDLE (MISCELLANEOUS) ×2 IMPLANT
GAUZE 4X4 16PLY RFD (DISPOSABLE) ×2 IMPLANT
GAUZE PACKING FOLDED 2  STR (GAUZE/BANDAGES/DRESSINGS) ×1
GAUZE PACKING FOLDED 2 STR (GAUZE/BANDAGES/DRESSINGS) ×1 IMPLANT
GLOVE BIO SURGEON STRL SZ 6.5 (GLOVE) ×2 IMPLANT
GLOVE SURG SS PI 6.0 STRL IVOR (GLOVE) ×2 IMPLANT
GOWN STRL REUS W/ TWL LRG LVL3 (GOWN DISPOSABLE) ×2 IMPLANT
GOWN STRL REUS W/TWL LRG LVL3 (GOWN DISPOSABLE) ×2
KIT BASIN OR (CUSTOM PROCEDURE TRAY) ×2 IMPLANT
KIT TURNOVER KIT B (KITS) ×2 IMPLANT
MANIFOLD NEPTUNE II (INSTRUMENTS) ×2 IMPLANT
NS IRRIG 1000ML POUR BTL (IV SOLUTION) ×2 IMPLANT
PACK EENT II TURBAN DRAPE (CUSTOM PROCEDURE TRAY) ×2 IMPLANT
PAD ARMBOARD 7.5X6 YLW CONV (MISCELLANEOUS) ×2 IMPLANT
SPONGE SURGIFOAM ABS GEL 100 (HEMOSTASIS) IMPLANT
SPONGE SURGIFOAM ABS GEL 12-7 (HEMOSTASIS) IMPLANT
SPONGE SURGIFOAM ABS GEL SZ50 (HEMOSTASIS) IMPLANT
SUCTION FRAZIER HANDLE 10FR (MISCELLANEOUS) ×1
SUCTION TUBE FRAZIER 10FR DISP (MISCELLANEOUS) ×1 IMPLANT
SUT CHROMIC 3 0 PS 2 (SUTURE) ×3 IMPLANT
SUT CHROMIC 4 0 P 3 18 (SUTURE) ×1 IMPLANT
SYR 50ML SLIP (SYRINGE) ×2 IMPLANT
SYR BULB IRRIG 60ML STRL (SYRINGE) ×2 IMPLANT
TOWEL GREEN STERILE FF (TOWEL DISPOSABLE) ×2 IMPLANT
TUBE CONNECTING 12X1/4 (SUCTIONS) ×2 IMPLANT
WATER STERILE IRR 1000ML POUR (IV SOLUTION) ×2 IMPLANT
WATER TABLETS ICX (MISCELLANEOUS) ×2 IMPLANT
YANKAUER SUCT BULB TIP NO VENT (SUCTIONS) ×2 IMPLANT

## 2020-10-12 NOTE — Progress Notes (Signed)
   Department of Dental Medicine   PREOPERATIVE NOTE  Service Date:  10/12/2020 Patient's Name:  Patrick Anthony Medical Record Number:  [id number]   [place] presents for dental procedures in the operating room. >>The patient denies any acute medical or dental changes and agrees to proceed with treatment as planned.  VITALS: BP (!) 119/94   Pulse 94   Temp 99.2 F (37.3 C) (Oral)   Resp 16   Ht 6' (1.829 m)   Wt 75.3 kg   SpO2 98%   BMI 22.51 kg/m   Lab Results  Component Value Date   WBC 14.9 (H) 10/11/2020   HGB 7.5 (L) 10/11/2020   HCT 22.8 (L) 10/11/2020   MCV 84.4 10/11/2020   PLT 322 10/11/2020   BMET    Component Value Date/Time   NA 129 (L) 10/11/2020 1203   K 5.0 10/11/2020 1203   CL 90 (L) 10/11/2020 1203   CO2 23 10/11/2020 1203   GLUCOSE 109 (H) 10/11/2020 1203   BUN 56 (H) 10/11/2020 1203   CREATININE 11.02 (H) 10/11/2020 1203   CALCIUM 8.1 (L) 10/11/2020 1203   GFRNONAA 5 (L) 10/11/2020 1203   GFRAA 5 (L) 10/31/2019 0252    Lab Results  Component Value Date   INR 1.1 10/04/2020   INR 1.3 (H) 10/02/2020   INR 1.4 (H) 10/01/2020   No results found for: PTT   EXAM: No signs of acute dental changes.   ASSESSMENT:  1. Mitral valve endocarditis 2. Dental caries 3. Chronic apical periodontitis   PLAN: >>The patient agrees to proceed with treatment as planned in the operating room as previously discussed and accepts the risks, benefits, and complications of the proposed treatment which includes Extraction of teeth numbers 11, 12, 15, 23 and 26 with Alveoloplasty as needed. Patient is aware of the risk for bleeding, bruising, swelling, infection, pain, nerve damage, sinus involvement, root tip fracture, mandible fracture, and the risks of complications associated with the anesthesia. Patient also is aware of the potential for other complications not mentioned above.   Carlisle Benson Norway, DMD

## 2020-10-12 NOTE — Op Note (Signed)
Department of Dental Medicine   OPERATIVE REPORT  DATE OF SURGERY:  10/12/2020  PATIENT NAME:   Patrick Anthony DATE OF BIRTH:   1970-12-18 MEDICAL RECORD NUMBER: [id number]  SURGEON:  Rohit Deloria B. Benson Norway, D.M.D.  ASSISTANT:  Medical assistant MOR2  PREOPERATIVE DIAGNOSES:  Dental caries, periodontal disease Patient Active Problem List   Diagnosis Date Noted  . Dental caries   . Chronic apical periodontitis   . Chronic periodontitis   . Accretions on teeth   . Mitral regurgitation   . Uncontrolled hypertension   . Patent foramen ovale   . Cerebral embolism with cerebral infarction 09/29/2020  . 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 Bacteremia due to Staphylococcus aureus   . Sepsis ([place]) 09/27/2020  . Pneumonia due to infectious agent 09/27/2020  . 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 Sepsis due to pneumonia ([place]) 09/27/2020  . Lactic acidosis 09/27/2020  . Bacterial endocarditis 09/27/2020  . Chest pain 05/03/2017  . End stage renal disease ([place]) 10/17/2013  . Anemia in chronic kidney disease 08/30/2013  . Hepatitis C 08/30/2013  . ESRD on dialysis ([place]) 08/30/2013  . Hypertensive crisis 08/28/2013  . Hypertensive emergency 04/20/2013  . Chronic combined systolic and diastolic congestive heart failure ([place]) 04/19/2013  . HTN (hypertension) 04/19/2013    POSTOPERATIVE DIAGNOSES: Same  PROCEDURES PERFORMED: 1. Extractions of teeth numbers 11, 12, 15, 23 and 26  ANESTHESIA: General anesthesia via endotracheal tube.  MEDICATIONS: 1. Ancef 2 g IV prior to invasive dental procedures. 2. Local anesthesia with a total utilization of 4 cc of 1% lidocaine with 1:100,000 epinephrine.  SPECIMENS: 5 teeth that were extracted and discarded.  DRAINS/CULTURES: None  COMPLICATIONS: None  ESTIMATED BLOOD LOSS: 5 mL  INTRAVENOUS FLUIDS: 10 mL of Lactated ringers solution.  INDICATION: The patient was recently diagnosed with  mitral valve endocarditis.  A medically necessary dental consult was then requested to evaluate the patient for any dental/orofacial infection and their overall oral health.  The patient was examined and subsequently treatment planned for multiple extractions of severely decayed teeth and chronically infected teeth.  This treatment plan was made to decrease the perioperative and postoperative risks and complications associated with dental/orofacial infection from affecting the patient's systemic health.  OPERATIVE FINDINGS: The patient was examined in operating room number 2.  The indicated teeth were identified and verified for extraction. The patient was noted be affected by severe dental decay, chronic periodontitis and chronic apical periodontitis.  DESCRIPTION OF PROCEDURE: The patient was identified in the holding area and brought to the main operating room number 2 by the anesthesia team. The patient was then placed in the supine position on the operating table.  General anesthesia was then induced per the anesthesia team. The patient was then prepped and draped in the usual sterile fashion for dental medicine procedures.  A timeout was performed. The patient was identified and procedures were verified. A throat pack was placed at this time. The oral cavity was then thoroughly examined with the findings noted above. The patient was then ready for the dental medicine procedure as follows:  ROUTINE EXTRACTIONS: Local anesthesia was administered sequentially with a total utilization of 4cc of 1% lidocaine with 1:100,000 epinephrine. Location of anesthesia included maxillary infiltration of indicated teeth and mandibular infiltration of teeth numbers 23 and 26.  The maxillary right quadrant was first approached. The teeth were then subluxated with a series of straight elevators. Tooth numbers 11, 12 and 15 were  then removed with a 150 forceps and/or Ronguers without complications. The surgical sites were  then irrigated with copious amounts of sterile saline. The tissues were approximated and trimmed appropriately. The surgical sites were closed using 3-0 chromic gut sutures as follows: #11 and #12 simple interrupted and figure-8 style suture.  The mandibular left and right quadrants were then approached. The teeth were subluxated with a series of straight elevators. Tooth numbers 23 and 26 were then removed utilizing a 151 forceps and Ronguers. The tissues were approximated and trimmed appropriately. The surgical sites were then irrigated with copious amounts of sterile saline. The surgical sites were closed using 3-0 chromic gut sutures as follows: #23 and #26 simple interrupted.  END OF PROCEDURE: Thorough oral irrigation with sterile saline was performed. The patient was examined for complications, and seeing none, the dental medicine procedure was deemed to be complete. The throat pack was removed at this time. An oral airway was then placed at the request of the anesthesia team. A series of 4 x 4 gauze were placed in the mouth to aid hemostasis as needed. The patient was then handed over to the anesthesia team for final disposition. After an appropriate amount of time, the patient was extubated and taken to the postanesthsia care unit in stable condition. All counts were correct for the dental medicine procedure.   Northwood Benson Norway, D.M.D.

## 2020-10-12 NOTE — Anesthesia Procedure Notes (Signed)
Procedure Name: Intubation Date/Time: 10/12/2020 1:22 PM Performed by: Eligha Bridegroom, CRNA Pre-anesthesia Checklist: Patient identified, Emergency Drugs available, Suction available, Patient being monitored and Timeout performed Patient Re-evaluated:Patient Re-evaluated prior to induction Oxygen Delivery Method: Circle system utilized Preoxygenation: Pre-oxygenation with 100% oxygen Ventilation: Mask ventilation without difficulty and Oral airway inserted - appropriate to patient size Laryngoscope Size: Mac and 4 Grade View: Grade II Tube type: Oral Tube size: 7.5 mm Number of attempts: 1 Airway Equipment and Method: Stylet Placement Confirmation: ETT inserted through vocal cords under direct vision,  positive ETCO2 and breath sounds checked- equal and bilateral Secured at: 23 cm Tube secured with: Tape Dental Injury: Teeth and Oropharynx as per pre-operative assessment

## 2020-10-12 NOTE — Interval H&P Note (Signed)
History and Physical Interval Note:  10/12/2020 12:31 PM  Patrick Anthony  has presented today for surgery, with the diagnosis of dental carries.  The various methods of treatment have been discussed with the patient and family. After consideration of risks, benefits and other options for treatment, the patient has consented to  Procedure(s): MULTIPLE EXTRACTION WITH ALVEOLOPLASTY (N/A) as a surgical intervention.  The patient's history has been reviewed, patient examined, no change in status, stable for surgery.  I have reviewed the patient's chart and labs.  Questions were answered to the patient's satisfaction.     [name]

## 2020-10-12 NOTE — Progress Notes (Signed)
Pt returned from OR. Ice pack observed on face and no bleeding observed on arrival.

## 2020-10-12 NOTE — Progress Notes (Signed)
PROGRESS NOTE  Patrick Anthony [id number] DOB: February 14, 1971 DOA: 09/27/2020 PCP: [name], MD   LOS: 15 days   Brief Narrative / Interim history: 50 year old male with medical history of end-stage renal disease due to APKD on hemodialysis MWF through left upper extremity AV graft, hypertension, OSA, chronic diastolic CHF, EF [id number], liver cirrhosis due to hepatitis C, history of alcohol abuse came to hospital with generalized weakness.  He was admitted with sepsis due to suspected pneumonia and started on antibiotics.  Code stroke was called on hospital day 1 and imaging showed embolic strokes.  His blood cultures were positive for staph aureus and he's had TEE on 3/1 which is concerning for mitral valve endocarditis.  Currently on vancomycin.  ID, neurology, cardiology, and CT surgery are following. Dentistry to take to OR today and TCS in 2 days for valve repair  Subjective / 24h Interval events: No complaints this morning, no chest pain, no shortness of breath   Assessment & Plan: Principal Problem Mitral valve endocarditis, MRSA bacteremia-ID is following, patient has been started on vancomycin.  Surveillance blood cultures obtained on 2/27 after initiation of antibiotics remain negative.  CT scan of the abdomen and pelvis on 2/23 with cavitary mass in the left upper lobe, possibly septic embolus, as well as small foci of consolidation and groundglass density in the right upper lobe and small groundglass nodule in the left upper lobe (infectious versus inflammatory), CT also with polycystic kidney disease.  -Cardiothoracic surgery consulting, he is a surgical candidate, underwent cardiac cath 3/3 with clean coronaries.  Cardiothoracic surgery plans valve repair in 2 days.  Dental to take the OR today for several decayed teeth extraction  Active Problems New onset aphasia, scattered small acute infarcts in the bilateral cerebral hemispheres, right basal ganglia, callosal splenium,  bilateral cerebral hemispheres concerning for embolic process -likely due to endocarditis.  2D echo showed EF 55-60%, grade 2 DD.  An MRI of the head and neck without appreciable stenosis.  Acute metabolic encephalopathy-improving  Seizure-like activity upper extremities-myoclonus suspected, hold gabapentin.  EEG negative  ESRD due to adult polycystic kidney disease-on dialysis   Iron deficiency anemia, anemia of chronic disease-monitor hemoglobin, slightly lower today at 7.5, no bleeding   Hyponatremia-fluids managed with HD  Essential hypertension-continue regimen as below  Liver cirrhosis, history of hep C-monitor  Scheduled Meds: . (feeding supplement) PROSource Plus  30 mL Oral BID BM  . amLODipine  10 mg Oral Daily  . calcium acetate  1,334 mg Oral TID WC  . calcium acetate  667 mg Oral With snacks  . Chlorhexidine Gluconate Cloth  6 each Topical Q0600  . Chlorhexidine Gluconate Cloth  6 each Topical Q0600  . Chlorhexidine Gluconate Cloth  6 each Topical Q0600  . cinacalcet  30 mg Oral Q breakfast  . darbepoetin (ARANESP) injection - DIALYSIS  100 mcg Intravenous Q Wed-HD  . doxercalciferol  3 mcg Intravenous Q M,W,F-HD  . feeding supplement (NEPRO CARB STEADY)  237 mL Oral BID BM  . hydrALAZINE  50 mg Oral BID  . influenza vac split quadrivalent PF  0.5 mL Intramuscular Tomorrow-1000  . metoprolol tartrate  75 mg Oral BID  . multivitamin  1 tablet Oral QHS  . mupirocin ointment  1 application Nasal BID  . sodium chloride flush  3 mL Intravenous Q12H  . vancomycin  500 mg Intravenous Q M,W,F-HD   Continuous Infusions: . sodium chloride    .  ceFAZolin (ANCEF) IV  PRN Meds:.sodium chloride, acetaminophen, albuterol, HYDROmorphone (DILAUDID) injection, morphine injection, ondansetron (ZOFRAN) IV, ondansetron **OR** [DISCONTINUED] ondansetron (ZOFRAN) IV, oxyCODONE, polyethylene glycol, sodium chloride flush  Diet Orders (From admission, onward)    Start     Ordered    10/12/20 0000  Diet NPO time specified  Diet effective midnight        10/11/20 1841          DVT prophylaxis: SCD's Start: 10/11/20 1842 SCD's Start: 09/28/20 1041     Code Status: Full Code  Family Communication: no family at bedside  Status is: Inpatient  Remains inpatient appropriate because:Inpatient level of care appropriate due to severity of illness   Dispo: The patient is from: Home              Anticipated d/c is to: SNF              Patient currently is not medically stable to d/c.   Difficult to place patient No  Level of care: Progressive Cardiac  Consultants:  Neurology ID Cardiology Cardiothoracic surgery  Procedures:  2D echo, TEE, cardiac cath  Microbiology  Blood cultures MRSA Surveillance 2/27 - negative  Antimicrobials: Vancomycin     Objective: Vitals:   10/11/20 2035 10/11/20 2241 10/12/20 0014 10/12/20 0444  BP: (!) 154/91 126/86 104/75 125/86  Pulse: (!) 119 (!) 104 91 90  Resp: 20 18 (!) 22 18  Temp: (!) 100.6 F (38.1 C) (!) 100.7 F (38.2 C) 99.9 F (37.7 C) 97.9 F (36.6 C)  TempSrc: Oral Oral Oral Oral  SpO2:  98% 96% 98%  Weight:    75.3 kg  Height:        Intake/Output Summary (Last 24 hours) at 10/12/2020 [id number] Last data filed at 10/11/2020 1559 Gross per 24 hour  Intake 3 ml  Output 2500 ml  Net -2497 ml   Filed Weights   10/11/20 1150 10/11/20 1559 10/12/20 0444  Weight: 75.6 kg 73.1 kg 75.3 kg    Examination:  Constitutional: NAD Eyes: lids and conjunctivae normal, no scleral icterus ENMT: mmm Neck: normal, supple Respiratory: clear to auscultation bilaterally, no wheezing, no crackles. Normal respiratory effort.  Cardiovascular: Regular rate and rhythm, 3/6 SEM, no LE edema. Abdomen: soft, no distention, no tenderness. Bowel sounds positive.  Skin: no rashes Neurologic: no focal deficits, equal strength  Data Reviewed: I have independently reviewed following labs and imaging studies   CBC: Recent  Labs  Lab 2020/10/07 1529 10/07/20 0401 10/07/20 0952 10/07/20 0953 10/07/20 0956 10/08/20 0335 10/11/20 1203  WBC 18.7* 19.1*  --   --   --  19.2* 14.9*  HGB 8.1* 8.4* 9.5* 9.5* 9.2* 9.3* 7.5*  HCT 24.5* 25.4* 28.0* 28.0* 27.0* 29.7* 22.8*  MCV 83.3 83.6  --   --   --  85.6 84.4  PLT 221 270  --   --   --  295 [id number]   Basic Metabolic Panel: Recent Labs  Lab Oct 07, 2020 1529 10/07/20 0401 10/07/20 0952 10/07/20 0953 10/07/20 0956 10/08/20 0335 10/11/20 1203  NA 133* 133* 136 136 134* 132* 129*  K 3.9 4.2 4.3 4.3 4.3 4.6 5.0  CL 93* 94*  --   --   --  93* 90*  CO2 22 23  --   --   --  25 23  GLUCOSE 109* 102*  --   --   --  114* 109*  BUN 77* 34*  --   --   --  43* 56*  CREATININE 10.60* 6.39*  --   --   --  8.59* 11.02*  CALCIUM 7.3* 7.9*  --   --   --  8.5* 8.1*  PHOS 8.3*  --   --   --   --   --  7.8*   Liver Function Tests: Recent Labs  Lab 10/06/20 1529 10/07/20 0401 10/11/20 1203  AST  --  33  --   ALT  --  16  --   ALKPHOS  --  57  --   BILITOT  --  0.8  --   PROT  --  7.8  --   ALBUMIN 2.1* 2.2* 2.1*   Coagulation Profile: No results for input(s): INR, PROTIME in the last 168 hours. HbA1C: No results for input(s): HGBA1C in the last 72 hours. CBG: No results for input(s): GLUCAP in the last 168 hours.  Recent Results (from the past 240 hour(s))  Culture, blood (routine x 2)     Status: None   Collection Time: 10/03/20  2:42 AM   Specimen: BLOOD  Result Value Ref Range Status   Specimen Description BLOOD LEFT ARM  Final   Special Requests   Final    BOTTLES DRAWN AEROBIC AND ANAEROBIC Blood Culture results may not be optimal due to an inadequate volume of blood received in culture bottles   Culture   Final    NO GROWTH 5 DAYS Performed at [facility], [place] [address] [place], [place] [address]    Report Status 10/08/2020 FINAL  Final  Culture, blood (routine x 2)     Status: None   Collection Time: 10/03/20  2:42 AM   Specimen: BLOOD   Result Value Ref Range Status   Specimen Description BLOOD LEFT HAND  Final   Special Requests   Final    BOTTLES DRAWN AEROBIC ONLY Blood Culture results may not be optimal due to an inadequate volume of blood received in culture bottles   Culture   Final    NO GROWTH 5 DAYS Performed at [facility], [place] [address] [place], [place] [address]    Report Status 10/08/2020 FINAL  Final  Surgical pcr screen     Status: Abnormal   Collection Time: 10/11/20  6:44 PM   Specimen: Nasal Mucosa; Nasal Swab  Result Value Ref Range Status   MRSA, PCR POSITIVE (A) NEGATIVE Final    Comment: RESULT CALLED TO, READ BACK BY AND VERIFIED WITH: F COMBRES RN 10/11/20 2339 JDW    Staphylococcus aureus POSITIVE (A) NEGATIVE Final    Comment: (NOTE) The Xpert SA Assay (FDA approved for NASAL specimens in patients 42 years of age and older), is one component of a comprehensive surveillance program. It is not intended to diagnose infection nor to guide or monitor treatment. Performed at [facility], [place] [address], [place], [place] [address]      Radiology Studies: No results found.   Marzetta Board, MD, PhD [facility]  Between 7 am - 7 pm I am available, please contact me via Amion or Securechat  Between 7 pm - 7 am I am not available, please contact night coverage MD/APP via Amion

## 2020-10-12 NOTE — Anesthesia Preprocedure Evaluation (Addendum)
Anesthesia Evaluation  Patient identified by MRN, date of birth, ID band Patient awake    Reviewed: Allergy & Precautions, NPO status , Patient's Chart, lab work & pertinent test results, reviewed documented beta blocker date and time   History of Anesthesia Complications Negative for: history of anesthetic complications  Airway Mallampati: II  TM Distance: >3 FB Neck ROM: Full  Mouth opening: Limited Mouth Opening  Dental  (+) Poor Dentition, Missing,    Pulmonary sleep apnea , former smoker,    Pulmonary exam normal        Cardiovascular hypertension, Pt. on medications and Pt. on home beta blockers +CHF  Normal cardiovascular exam  TTE 10/05/20: EF 60 to 65%, moderate LVH, moderate LAE/RAE, extensive vegetation of both mitral leaflets, mild to moderate MR   Neuro/Psych CVA negative psych ROS   GI/Hepatic negative GI ROS, (+)     substance abuse  alcohol use, Hepatitis -, C  Endo/Other  negative endocrine ROS  Renal/GU ESRF and DialysisRenal disease (HD T/Th/Sa)  negative genitourinary   Musculoskeletal negative musculoskeletal ROS (+)   Abdominal   Peds  Hematology  (+) anemia , Hgb 7.5   Anesthesia Other Findings Dental caries  Reproductive/Obstetrics negative OB ROS                            Anesthesia Physical Anesthesia Plan  ASA: III  Anesthesia Plan: General   Post-op Pain Management:    Induction: Intravenous  PONV Risk Score and Plan: 2 and Treatment may vary due to age or medical condition, Ondansetron, Dexamethasone and Midazolam  Airway Management Planned: Nasal ETT  Additional Equipment: None  Intra-op Plan:   Post-operative Plan: Extubation in OR  Informed Consent: I have reviewed the patients History and Physical, chart, labs and discussed the procedure including the risks, benefits and alternatives for the proposed anesthesia with the patient or authorized  representative who has indicated his/her understanding and acceptance.     Dental advisory given  Plan Discussed with: CRNA  Anesthesia Plan Comments:        Anesthesia Quick Evaluation

## 2020-10-12 NOTE — H&P (View-Only) (Signed)
PROGRESS NOTE  Patrick Anthony [id number] DOB: 1970-11-16 DOA: 09/27/2020 PCP: [name], MD   LOS: 15 days   Brief Narrative / Interim history: 50 year old male with medical history of end-stage renal disease due to APKD on hemodialysis MWF through left upper extremity AV graft, hypertension, OSA, chronic diastolic CHF, EF [id number], liver cirrhosis due to hepatitis C, history of alcohol abuse came to hospital with generalized weakness.  He was admitted with sepsis due to suspected pneumonia and started on antibiotics.  Code stroke was called on hospital day 1 and imaging showed embolic strokes.  His blood cultures were positive for staph aureus and he's had TEE on 3/1 which is concerning for mitral valve endocarditis.  Currently on vancomycin.  ID, neurology, cardiology, and CT surgery are following. Dentistry to take to OR today and TCS in 2 days for valve repair  Subjective / 24h Interval events: No complaints this morning, no chest pain, no shortness of breath   Assessment & Plan: Principal Problem Mitral valve endocarditis, MRSA bacteremia-ID is following, patient has been started on vancomycin.  Surveillance blood cultures obtained on 2/27 after initiation of antibiotics remain negative.  CT scan of the abdomen and pelvis on 2/23 with cavitary mass in the left upper lobe, possibly septic embolus, as well as small foci of consolidation and groundglass density in the right upper lobe and small groundglass nodule in the left upper lobe (infectious versus inflammatory), CT also with polycystic kidney disease.  -Cardiothoracic surgery consulting, he is a surgical candidate, underwent cardiac cath 3/3 with clean coronaries.  Cardiothoracic surgery plans valve repair in 2 days.  Dental to take the OR today for several decayed teeth extraction  Active Problems New onset aphasia, scattered small acute infarcts in the bilateral cerebral hemispheres, right basal ganglia, callosal splenium,  bilateral cerebral hemispheres concerning for embolic process -likely due to endocarditis.  2D echo showed EF 55-60%, grade 2 DD.  An MRI of the head and neck without appreciable stenosis.  Acute metabolic encephalopathy-improving  Seizure-like activity upper extremities-myoclonus suspected, hold gabapentin.  EEG negative  ESRD due to adult polycystic kidney disease-on dialysis   Iron deficiency anemia, anemia of chronic disease-monitor hemoglobin, slightly lower today at 7.5, no bleeding   Hyponatremia-fluids managed with HD  Essential hypertension-continue regimen as below  Liver cirrhosis, history of hep C-monitor  Scheduled Meds: . (feeding supplement) PROSource Plus  30 mL Oral BID BM  . amLODipine  10 mg Oral Daily  . calcium acetate  1,334 mg Oral TID WC  . calcium acetate  667 mg Oral With snacks  . Chlorhexidine Gluconate Cloth  6 each Topical Q0600  . Chlorhexidine Gluconate Cloth  6 each Topical Q0600  . Chlorhexidine Gluconate Cloth  6 each Topical Q0600  . cinacalcet  30 mg Oral Q breakfast  . darbepoetin (ARANESP) injection - DIALYSIS  100 mcg Intravenous Q Wed-HD  . doxercalciferol  3 mcg Intravenous Q M,W,F-HD  . feeding supplement (NEPRO CARB STEADY)  237 mL Oral BID BM  . hydrALAZINE  50 mg Oral BID  . influenza vac split quadrivalent PF  0.5 mL Intramuscular Tomorrow-1000  . metoprolol tartrate  75 mg Oral BID  . multivitamin  1 tablet Oral QHS  . mupirocin ointment  1 application Nasal BID  . sodium chloride flush  3 mL Intravenous Q12H  . vancomycin  500 mg Intravenous Q M,W,F-HD   Continuous Infusions: . sodium chloride    .  ceFAZolin (ANCEF) IV  PRN Meds:.sodium chloride, acetaminophen, albuterol, HYDROmorphone (DILAUDID) injection, morphine injection, ondansetron (ZOFRAN) IV, ondansetron **OR** [DISCONTINUED] ondansetron (ZOFRAN) IV, oxyCODONE, polyethylene glycol, sodium chloride flush  Diet Orders (From admission, onward)    Start     Ordered    10/12/20 0000  Diet NPO time specified  Diet effective midnight        10/11/20 1841          DVT prophylaxis: SCD's Start: 10/11/20 1842 SCD's Start: 09/28/20 1041     Code Status: Full Code  Family Communication: no family at bedside  Status is: Inpatient  Remains inpatient appropriate because:Inpatient level of care appropriate due to severity of illness   Dispo: The patient is from: Home              Anticipated d/c is to: SNF              Patient currently is not medically stable to d/c.   Difficult to place patient No  Level of care: Progressive Cardiac  Consultants:  Neurology ID Cardiology Cardiothoracic surgery  Procedures:  2D echo, TEE, cardiac cath  Microbiology  Blood cultures MRSA Surveillance 2/27 - negative  Antimicrobials: Vancomycin     Objective: Vitals:   10/11/20 2035 10/11/20 2241 10/12/20 0014 10/12/20 0444  BP: (!) 154/91 126/86 104/75 125/86  Pulse: (!) 119 (!) 104 91 90  Resp: 20 18 (!) 22 18  Temp: (!) 100.6 F (38.1 C) (!) 100.7 F (38.2 C) 99.9 F (37.7 C) 97.9 F (36.6 C)  TempSrc: Oral Oral Oral Oral  SpO2:  98% 96% 98%  Weight:    75.3 kg  Height:        Intake/Output Summary (Last 24 hours) at 10/12/2020 [id number] Last data filed at 10/11/2020 1559 Gross per 24 hour  Intake 3 ml  Output 2500 ml  Net -2497 ml   Filed Weights   10/11/20 1150 10/11/20 1559 10/12/20 0444  Weight: 75.6 kg 73.1 kg 75.3 kg    Examination:  Constitutional: NAD Eyes: lids and conjunctivae normal, no scleral icterus ENMT: mmm Neck: normal, supple Respiratory: clear to auscultation bilaterally, no wheezing, no crackles. Normal respiratory effort.  Cardiovascular: Regular rate and rhythm, 3/6 SEM, no LE edema. Abdomen: soft, no distention, no tenderness. Bowel sounds positive.  Skin: no rashes Neurologic: no focal deficits, equal strength  Data Reviewed: I have independently reviewed following labs and imaging studies   CBC: Recent  Labs  Lab 05-Nov-2020 1529 10/07/20 0401 10/07/20 0952 10/07/20 0953 10/07/20 0956 10/08/20 0335 10/11/20 1203  WBC 18.7* 19.1*  --   --   --  19.2* 14.9*  HGB 8.1* 8.4* 9.5* 9.5* 9.2* 9.3* 7.5*  HCT 24.5* 25.4* 28.0* 28.0* 27.0* 29.7* 22.8*  MCV 83.3 83.6  --   --   --  85.6 84.4  PLT 221 270  --   --   --  295 [id number]   Basic Metabolic Panel: Recent Labs  Lab Nov 05, 2020 1529 10/07/20 0401 10/07/20 0952 10/07/20 0953 10/07/20 0956 10/08/20 0335 10/11/20 1203  NA 133* 133* 136 136 134* 132* 129*  K 3.9 4.2 4.3 4.3 4.3 4.6 5.0  CL 93* 94*  --   --   --  93* 90*  CO2 22 23  --   --   --  25 23  GLUCOSE 109* 102*  --   --   --  114* 109*  BUN 77* 34*  --   --   --  43* 56*  CREATININE 10.60* 6.39*  --   --   --  8.59* 11.02*  CALCIUM 7.3* 7.9*  --   --   --  8.5* 8.1*  PHOS 8.3*  --   --   --   --   --  7.8*   Liver Function Tests: Recent Labs  Lab 10/06/20 1529 10/07/20 0401 10/11/20 1203  AST  --  33  --   ALT  --  16  --   ALKPHOS  --  57  --   BILITOT  --  0.8  --   PROT  --  7.8  --   ALBUMIN 2.1* 2.2* 2.1*   Coagulation Profile: No results for input(s): INR, PROTIME in the last 168 hours. HbA1C: No results for input(s): HGBA1C in the last 72 hours. CBG: No results for input(s): GLUCAP in the last 168 hours.  Recent Results (from the past 240 hour(s))  Culture, blood (routine x 2)     Status: None   Collection Time: 10/03/20  2:42 AM   Specimen: BLOOD  Result Value Ref Range Status   Specimen Description BLOOD LEFT ARM  Final   Special Requests   Final    BOTTLES DRAWN AEROBIC AND ANAEROBIC Blood Culture results may not be optimal due to an inadequate volume of blood received in culture bottles   Culture   Final    NO GROWTH 5 DAYS Performed at [facility], [place] [address] [place], [place] [address]    Report Status 10/08/2020 FINAL  Final  Culture, blood (routine x 2)     Status: None   Collection Time: 10/03/20  2:42 AM   Specimen: BLOOD   Result Value Ref Range Status   Specimen Description BLOOD LEFT HAND  Final   Special Requests   Final    BOTTLES DRAWN AEROBIC ONLY Blood Culture results may not be optimal due to an inadequate volume of blood received in culture bottles   Culture   Final    NO GROWTH 5 DAYS Performed at [facility], [place] [address] [place], [place] [address]    Report Status 10/08/2020 FINAL  Final  Surgical pcr screen     Status: Abnormal   Collection Time: 10/11/20  6:44 PM   Specimen: Nasal Mucosa; Nasal Swab  Result Value Ref Range Status   MRSA, PCR POSITIVE (A) NEGATIVE Final    Comment: RESULT CALLED TO, READ BACK BY AND VERIFIED WITH: F COMBRES RN 10/11/20 2339 JDW    Staphylococcus aureus POSITIVE (A) NEGATIVE Final    Comment: (NOTE) The Xpert SA Assay (FDA approved for NASAL specimens in patients 22 years of age and older), is one component of a comprehensive surveillance program. It is not intended to diagnose infection nor to guide or monitor treatment. Performed at [facility], [place] [address] [place], [place] [address]      Radiology Studies: No results found.   Marzetta Board, MD, PhD [facility]  Between 7 am - 7 pm I am available, please contact me via Amion or Securechat  Between 7 pm - 7 am I am not available, please contact night coverage MD/APP via Amion

## 2020-10-12 NOTE — Progress Notes (Signed)
Pt taken to OR at 1155.

## 2020-10-12 NOTE — Progress Notes (Addendum)
Physical Therapy Treatment & Discharge Patient Details Name: Patrick Anthony MRN: [id number] DOB: [birth year] Today's Date: 10/12/2020    History of Present Illness Pt is a 50 y.o. male admitted 09/27/20 with fever, chills, chest pain, SOB, generalized weakness. Pt found to have MRSA bacteremia. MRI showed multiple scattered small infarcts (bilateral cerebral hemispheres, R basal ganglia, bileteral cerebellum); suspect embolic process. Echo showed mitral valve vegetation, mild-moderate mitral regurgitation. No significant CAD on L heart cath. Plan for MVR on 3/10. PMH includes CKD (HD MWF), HTN, CHF, Hep C, polysubstance abuse.   PT Comments    Pt continues to decline any OOB activity with PT/OT services Today, pt only agreeable to perform bed-level activity; pt unwilling to practice mobilizing without UE support in attempt to practice "sternal precautions" in preparation for procedure on 3/10. Increased time and education regarding importance of activity pre and post-op, especially related to post-op prognosis. Pt requesting PT/OT services sign off despite best efforts to encourage participation. Acute PT services will sign-off. Please reorder if new needs arise and pt willing to participate.    Follow Up Recommendations  SNF;Supervision/Assistance - 24 hour (TBD - pt will likely require post-acute rehab services post-op, if agreeable)     Equipment Recommendations   (TBD post-op)    Recommendations for Other Services       Precautions / Restrictions Precautions Precautions: Fall;Other (comment) Precaution Comments: Initiated educ on sternal precautions in preparation for MVR (planned 3/10) Restrictions Weight Bearing Restrictions: No    Mobility  Bed Mobility Overal bed mobility: Modified Independent Bed Mobility: Rolling Rolling: Modified independent (Device/Increase time)         General bed mobility comments: Received with bed flat, pt able to roll completely onto R/L sides  with use of bed rail; c/o R shoulder pain when rolling onto it; declined attempts to roll without use of bed rail (educ on need to practice this if pt to have sternal precautions post-op)    Transfers                 General transfer comment: pt adamantly declined despite max encouragement/education  Ambulation/Gait                 Stairs             Wheelchair Mobility    Modified Rankin (Stroke Patients Only)       Balance                                            Cognition Arousal/Alertness: Awake/alert Behavior During Therapy: Flat affect Overall Cognitive Status: No family/caregiver present to determine baseline cognitive functioning                                 General Comments: Pt with minimal responses to questions, minimal conversation; difficult to determine true cognitive impairment vs. fatigue vs. desire to participate. Pt with apparent poor insight into importance of mobility, especially with planned procedure; difficult to reason with regarding this, continues to declined OOB      Exercises      General Comments General comments (skin integrity, edema, etc.): Initiated educ on sternal precautions (pt with handout provided by cardiac rehab). Discussed potential to sign off on PT/OT services since pt not interested in participating, pt requesting we sign off  even though pt reports awareness he will likely require PT/OT services post-op. When asked why he will not participate, pt reports, "I just don't feel like it... I'm tired"      Pertinent Vitals/Pain Pain Assessment: Faces Faces Pain Scale: Hurts a little bit Pain Location: R shoulder with sidelying on R-side Pain Descriptors / Indicators: Discomfort;Guarding Pain Intervention(s): Repositioned    Home Living                      Prior Function            PT Goals (current goals can now be found in the care plan section) Progress  towards PT goals: Not progressing toward goals - comment (refusing OOB mobility)    Frequency    Min 3X/week      PT Plan Current plan remains appropriate    Co-evaluation              AM-PAC PT "6 Clicks" Mobility   Outcome Measure  Help needed turning from your back to your side while in a flat bed without using bedrails?: A Little Help needed moving from lying on your back to sitting on the side of a flat bed without using bedrails?: A Little Help needed moving to and from a bed to a chair (including a wheelchair)?: A Little Help needed standing up from a chair using your arms (e.g., wheelchair or bedside chair)?: A Little Help needed to walk in hospital room?: A Little Help needed climbing 3-5 steps with a railing? : A Lot 6 Click Score: 17    End of Session   Activity Tolerance: Patient limited by fatigue Patient left: in bed;with call bell/phone within reach;with bed alarm set Nurse Communication: Mobility status PT Visit Diagnosis: Unsteadiness on feet (R26.81);Muscle weakness (generalized) (M62.81);Difficulty in walking, not elsewhere classified (R26.2);Other symptoms and signs involving the nervous system [id number])     Time: [id number] PT Time Calculation (min) (ACUTE ONLY): 9 min  Charges:  $Self Care/Home Management: [place], PT, DPT Acute Rehabilitation Services  Pager [phone] Office [phone]  Derry Lory 10/12/2020, 10:38 AM

## 2020-10-12 NOTE — Transfer of Care (Signed)
Immediate Anesthesia Transfer of Care Note  Patient: Patrick Anthony  Procedure(s) Performed: MULTIPLE EXTRACTION WITH ALVEOLOPLASTY (N/A Mouth)  Patient Location: PACU  Anesthesia Type:General  Level of Consciousness: awake, alert  and oriented  Airway & Oxygen Therapy: Patient Spontanous Breathing  Post-op Assessment: Report given to RN and Post -op Vital signs reviewed and stable  Post vital signs: Reviewed and stable  Last Vitals:  Vitals Value Taken Time  BP 158/46 10/12/20 1434  Temp    Pulse 72 10/12/20 1436  Resp 19 10/12/20 1436  SpO2 100 % 10/12/20 1436  Vitals shown include unvalidated device data.  Last Pain:  Vitals:   10/12/20 0858  TempSrc: Oral  PainSc: 0-No pain      Patients Stated Pain Goal: 0 ([id number])  Complications: No complications documented.

## 2020-10-12 NOTE — Progress Notes (Addendum)
[facility] Progress Note   Subjective: Seen in room. NPO for dental extraction today. BP much lower today. Admits he did not take antihypertensive meds as directed at home. No C/Os at present.   Objective Vitals:   10/12/20 0014 10/12/20 0444 10/12/20 0858 10/12/20 1004  BP: 104/75 125/86 (!) 122/93 (!) 119/94  Pulse: 91 90 96 94  Resp: (!) '22 18 16   '$ Temp: 99.9 F (37.7 C) 97.9 F (36.6 C) 99.2 F (37.3 C)   TempSrc: Oral Oral Oral   SpO2: 96% 98%    Weight:  75.3 kg    Height:       Physical Exam General: Chronically ill appearing male in NAD Heart: [id number] 3/6 systolic M.  Lungs: CTAB Abdomen: Active BS. S, NT Extremities: No LE Edema.  Dialysis Access: L AVG + Bruit  Additional Objective Labs: Basic Metabolic Panel: Recent Labs  Lab 10/06/20 1529 10/07/20 0401 10/07/20 0952 10/07/20 0956 10/08/20 0335 10/11/20 1203  NA 133* 133*   < > 134* 132* 129*  K 3.9 4.2   < > 4.3 4.6 5.0  CL 93* 94*  --   --  93* 90*  CO2 22 23  --   --  25 23  GLUCOSE 109* 102*  --   --  114* 109*  BUN 77* 34*  --   --  43* 56*  CREATININE 10.60* 6.39*  --   --  8.59* 11.02*  CALCIUM 7.3* 7.9*  --   --  8.5* 8.1*  PHOS 8.3*  --   --   --   --  7.8*   < > = values in this interval not displayed.   Liver Function Tests: Recent Labs  Lab 10/06/20 1529 10/07/20 0401 10/11/20 1203  AST  --  33  --   ALT  --  16  --   ALKPHOS  --  57  --   BILITOT  --  0.8  --   PROT  --  7.8  --   ALBUMIN 2.1* 2.2* 2.1*   No results for input(s): LIPASE, AMYLASE in the last 168 hours. CBC: Recent Labs  Lab 10/06/20 1529 10/07/20 0401 10/07/20 0952 10/07/20 0956 10/08/20 0335 10/11/20 1203  WBC 18.7* 19.1*  --   --  19.2* 14.9*  HGB 8.1* 8.4*   < > 9.2* 9.3* 7.5*  HCT 24.5* 25.4*   < > 27.0* 29.7* 22.8*  MCV 83.3 83.6  --   --  85.6 84.4  PLT 221 270  --   --  295 322   < > = values in this interval not displayed.   Blood Culture    Component Value Date/Time    SDES BLOOD LEFT ARM 10/03/2020 0242   SDES BLOOD LEFT HAND 10/03/2020 0242   SPECREQUEST  10/03/2020 0242    BOTTLES DRAWN AEROBIC AND ANAEROBIC Blood Culture results may not be optimal due to an inadequate volume of blood received in culture bottles   SPECREQUEST  10/03/2020 0242    BOTTLES DRAWN AEROBIC ONLY Blood Culture results may not be optimal due to an inadequate volume of blood received in culture bottles   CULT  10/03/2020 0242    NO GROWTH 5 DAYS Performed at [facility], [place] [address], [place], [place] [address]    CULT  10/03/2020 0242    NO GROWTH 5 DAYS Performed at [facility], [place] [address], [place], [place] [address]    REPTSTATUS  10/08/2020 FINAL 10/03/2020 0242   REPTSTATUS 10/08/2020 FINAL 10/03/2020 0242    Cardiac Enzymes: No results for input(s): CKTOTAL, CKMB, CKMBINDEX, TROPONINI in the last 168 hours. CBG: No results for input(s): GLUCAP in the last 168 hours. Iron Studies: No results for input(s): IRON, TIBC, TRANSFERRIN, FERRITIN in the last 72 hours. '@lablastinr3'$ @ Studies/Results: No results found. Medications: . sodium chloride    .  ceFAZolin (ANCEF) IV     . (feeding supplement) PROSource Plus  30 mL Oral BID BM  . amLODipine  10 mg Oral Daily  . calcium acetate  1,334 mg Oral TID WC  . calcium acetate  667 mg Oral With snacks  . Chlorhexidine Gluconate Cloth  6 each Topical Q0600  . Chlorhexidine Gluconate Cloth  6 each Topical Q0600  . Chlorhexidine Gluconate Cloth  6 each Topical Q0600  . cinacalcet  30 mg Oral Q breakfast  . darbepoetin (ARANESP) injection - DIALYSIS  100 mcg Intravenous Q Wed-HD  . doxercalciferol  3 mcg Intravenous Q M,W,F-HD  . feeding supplement (NEPRO CARB STEADY)  237 mL Oral BID BM  . hydrALAZINE  50 mg Oral BID  . influenza vac split quadrivalent PF  0.5 mL Intramuscular Tomorrow-1000  . metoprolol tartrate  75 mg Oral BID  . multivitamin  1 tablet Oral QHS  . mupirocin ointment  1 application  Nasal BID  . sodium chloride flush  3 mL Intravenous Q12H  . vancomycin  500 mg Intravenous Q M,W,F-HD     OP HD:MWF [facility] 4h 350/500 82.5kg 2/2.5 bath P1 LUA AVG  -Heparin 5000 units IV TIW -Aranesp 67mg IV q 2wks - last 09/27/20 (Verified order) -Hectorol375m IV qHD   Assessment/Plan: 1. MRSA bacteremia/MV endocarditis: w/ fevers and AMS/embolic CVA's. [facility]  Cavitary mass LUL possibly septic emboli. ID following on Vancomycin.  Has L AVG w/o overt signs of infection. Seen by Dr. OwRoxy Mannstentative  plan valve repair 10/14/20 after tooth extraction 10/12/20. Per Primary.  2. 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 AMS -Resolved.  4. ESRD - usual HD MWF. Next HD 10/12/20.  5. Hypertension/volume-BP controlled. H/O resistant hypertension and medical noncompliance, BP historically difficult to control. Hehas beenon 4 antihypertensive medications prior to admission. (amlodipine 10 mg, clonidine transdermal, hydralazine 50 mg PO BID, metoprolol 75 mg PO BID). BP lower than normal here probably due to medications being given to him. Clonidine DC'd. Will decrease hydralazine today.  Now under OP EDW. UF as tolerated and lower EDW on discharge.  Net UF with HD 2.5 L 10/12/20 6. Anemiaof CKD-HGB 7.5 10/11/20. Transfuse tomorrow on HD prior to surgery on 10/14/20. Continue weekly ESA. Tsat low, holding Fe load D/T infection.  7. Secondary Hyperparathyroidism -Ca in goal, phoselevated-chronic issue with binder compliance. . Continue hectorol, sensipar and phoslo. 8. Nutrition- Renal diet w/fluid restrictionon Nepro Albumin 2.2 Add protein supps.  9. Hx Hep C /liver cirrhosis.  Lennie Dunnigan H. Aziza Stuckert NP-C 10/12/2020, 10:38 AM  [id number]

## 2020-10-12 NOTE — Progress Notes (Signed)
SLP Cancellation Note  Patient Details Name: Layden Fugett MRN: [id number] DOB: 1971-05-16   Cancelled treatment:       Reason Eval/Treat Not Completed: Patient at procedure or test/unavailable (in OR). Will f/u as able.     Osie Bond., M.A. [place] Acute Rehabilitation Services Pager [phone] Office [phone]  10/12/2020, 12:49 PM

## 2020-10-12 NOTE — Progress Notes (Signed)
[place] for Infectious Disease    Date of Admission:  09/27/2020   Total days of antibiotics 5           ID: Patrick Anthony is a 50 y.o. 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 Principal Problem:   Bacterial endocarditis Active Problems:   Chronic combined systolic and diastolic congestive heart failure (HCC)   HTN (hypertension)   Anemia in chronic kidney disease   ESRD on dialysis ([name])   End stage renal disease (HCC)   Sepsis ([name])   Pneumonia due to infectious agent   ADPKD (autosomal dominant polycystic kidney disease)   Sepsis due to pneumonia (HCC)   Lactic acidosis   Cerebral embolism with cerebral infarction   Bacteremia due to Staphylococcus aureus   Mitral regurgitation   Uncontrolled hypertension   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   Patent foramen ovale   Dental caries   Chronic apical periodontitis   Chronic periodontitis   Accretions on teeth    Subjective: No complaint No N/V/D/HA No dyspnea/chest pain  ROS: 12 point ros is negative   Medications:  . (feeding supplement) PROSource Plus  30 mL Oral BID BM  . amLODipine  10 mg Oral Daily  . calcium acetate  1,334 mg Oral TID WC  . calcium acetate  667 mg Oral With snacks  . Chlorhexidine Gluconate Cloth  6 each Topical Q0600  . Chlorhexidine Gluconate Cloth  6 each Topical Q0600  . Chlorhexidine Gluconate Cloth  6 each Topical Q0600  . cinacalcet  30 mg Oral Q breakfast  . darbepoetin (ARANESP) injection - DIALYSIS  100 mcg Intravenous Q Wed-HD  . doxercalciferol  3 mcg Intravenous Q M,W,F-HD  . feeding supplement (NEPRO CARB STEADY)  237 mL Oral BID BM  . hydrALAZINE  25 mg Oral BID  . influenza vac split quadrivalent PF  0.5 mL Intramuscular Tomorrow-1000  . metoprolol tartrate  75 mg Oral BID  . multivitamin  1 tablet Oral QHS  . mupirocin ointment  1 application Nasal BID  . sodium chloride flush  3 mL Intravenous Q12H  . vancomycin   500 mg Intravenous Q M,W,F-HD    Objective: Vital signs in last 24 hours: Temp:  [97 F (36.1 C)-100.7 F (38.2 C)] 99.5 F (37.5 C) (03/08 1533) Pulse Rate:  [72-119] 84 (03/08 1532) Resp:  [12-22] 19 (03/08 1504) BP: (104-182)/(46-94) 110/83 (03/08 1532) SpO2:  [96 %-100 %] 100 % (03/08 1532) Weight:  [75.3 kg] 75.3 kg (03/08 0444)  Physical Exam  Constitutional: no distress, conversant HEENT: conj clear; normocephalic; oropharynx clear Cardiovascular: rrr 2/6 systolic murmur Pulmonary/Chest: Effort normal; cta Abdominal: Soft/nt Neurological: nonfocal Psych alert/oriented, mood apprpriate Skin no rash; left ue AVG site good thrill without swelling/fluctuance/tenderness    Lab Results Recent Labs    10/11/20 1203 10/12/20 1226  WBC 14.9*  --   HGB 7.5* 9.2*  HCT 22.8* 27.0*  NA 129* 131*  K 5.0 4.9  CL 90* 92*  CO2 23  --   BUN 56* 34*  CREATININE 11.02* 7.00*   Liver Panel Recent Labs    10/11/20 1203  ALBUMIN 2.1*    Microbiology: 2/24 blood cx NGTD 2/21 blood cx MRSA   Studies/Results: 2/22 brain mri 1. 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 Multiple vascular territories are involved and findings are highly suspicious for an embolic  process. 2. 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 A few scattered supratentorial chronic microhemorrhages are also present. 3. Mild generalized atrophy of the brain  2/22 tte 1. Left ventricular ejection fraction, by estimation, is 55 to 60%. The  left ventricle has normal function. The left ventricle has no regional  wall motion abnormalities. There is moderate concentric left ventricular  hypertrophy. Left ventricular  diastolic parameters are consistent with Grade II diastolic dysfunction  (pseudonormalization).  2. Right ventricular systolic  function is normal. The right ventricular  size is normal. There is normal pulmonary artery systolic pressure.  3. Left atrial size was moderately dilated.  4. Right atrial size was mild to moderately dilated.  5. The mitral valve is normal in structure. Mild to moderate mitral valve  regurgitation. No evidence of mitral stenosis. Moderate to severe mitral  annular calcification.  6. The aortic valve is tricuspid. Aortic valve regurgitation is trivial.  No aortic stenosis is present.  7. The inferior vena cava is normal in size with greater than 50%  respiratory variability, suggesting right atrial pressure of 3 mmHg.  8. Agitated saline contrast bubble study was negative, with no evidence  of any interatrial shunt.  2/23 chest abd pelv ct 1. Cavitary mass in the left upper lobe apex measuring up to 2.2 cm with surrounding ground-glass density. Findings could be secondary to cavitary infection or potential septic embolus. A cavitary neoplasm is also in the differential. There are additional small foci of consolidation and ground-glass density in the right upper lobe and small ground-glass nodule in left upper lobe, potentially infectious or inflammatory. Short interval chest CT follow-up is recommended. 2. Polycystic kidney disease. 3. No CT evidence for acute intra-abdominal or pelvic abnormality. 4. Bulky calcified mass anterior to right shoulder with smaller left paraspinal soft tissue calcification probably due to calcinosis related to chronic kidney disease  2/24 mr lumbar thoracic 1. No thoracolumbar discitis-osteomyelitis or epidural collection. 2. Mild bone marrow edema and disc space edema at C6-C7, likely degenerative. 3. Mild L5-S1 degenerative disc disease without spinal canal or neural foraminal stenosis  Assessment/Plan: Abx: 2/21-c vanc  50yo M with ESRD dialyzed via lue gore-tex avg (placed 10/2019), hep c (no prior tx) admitted 2/21 with 10 day malaise,  fever, found to have sepsis due to MRSA bacteremia c/b embolic stroke presumably from endocarditis  Complications: CNS septic emboli, pulmonary cavitary lesions/septic emboli. Has lower back pain but negative mri lumbar thoracic  Doesn't appear AVG clinically involved with mrsa bsi at this time. But if relapse after tx, should investigate further  2/21 & 2/24 bcx mrsa 2/27 bcx negative  Chronic hep c -- will f/u outpatient for treatment; genotype ordered here  ----- 3/08 assessment 3/01 tee showed LAA thrombus and 1.5 cm MV vegetation without significant MR. Overall improving mentation/clinical status. A surgical candidate in setting cns emboli and large mv vegetation. Await dental clearance for poor dentition prior to valve replacement  Await multiple teeth extraction prior to cts procedure   -continue vancomycin -duration vancomycin 6 weeks from 2/27 to 11/14/2020; to receive with dialysis; no need for opat -if during valve replacement and surgery finding concerning (purulence and culture positive) would do 6 weeks abx as dr Tommy Medal suggested. But if no abscess/purulence and culture of valve tissue negative, potentially could finish original 6 weeks duration above suggested but at least 2 weeks coverage post procedure (whichever is longer) -please send valve tissue for culture  -weekly cbc,  cmp, crp  -id clinic f/u as below Fax weekly labs to [phone]  Clinic Follow Up Appt: 3/24 @ 330 with dr Gale Journey  @  [facility] [place], [place], [place] [address] Phone: [phone]     [place] for Infectious Diseases  I spent more than 30 minute reviewing data/chart, and coordinating care and >50% direct face to face time providing counseling/discussing diagnostics/treatment plan with patient   10/12/2020, 5:33 PM

## 2020-10-13 ENCOUNTER — Inpatient Hospital Stay (HOSPITAL_COMMUNITY): Payer: Medicaid Other

## 2020-10-13 ENCOUNTER — Encounter (HOSPITAL_COMMUNITY): Payer: Self-pay | Admitting: Dentistry

## 2020-10-13 DIAGNOSIS — D631 Anemia in chronic kidney disease: Secondary | ICD-10-CM | POA: Diagnosis not present

## 2020-10-13 DIAGNOSIS — R0602 Shortness of breath: Secondary | ICD-10-CM | POA: Diagnosis not present

## 2020-10-13 DIAGNOSIS — J189 Pneumonia, unspecified organism: Secondary | ICD-10-CM | POA: Diagnosis not present

## 2020-10-13 DIAGNOSIS — Z992 Dependence on renal dialysis: Secondary | ICD-10-CM | POA: Diagnosis not present

## 2020-10-13 DIAGNOSIS — I330 Acute and subacute infective endocarditis: Secondary | ICD-10-CM | POA: Diagnosis not present

## 2020-10-13 DIAGNOSIS — I509 Heart failure, unspecified: Secondary | ICD-10-CM | POA: Diagnosis not present

## 2020-10-13 DIAGNOSIS — A419 Sepsis, unspecified organism: Secondary | ICD-10-CM | POA: Diagnosis not present

## 2020-10-13 DIAGNOSIS — I132 Hypertensive heart and chronic kidney disease with heart failure and with stage 5 chronic kidney disease, or end stage renal disease: Secondary | ICD-10-CM | POA: Diagnosis not present

## 2020-10-13 DIAGNOSIS — I639 Cerebral infarction, unspecified: Secondary | ICD-10-CM | POA: Diagnosis not present

## 2020-10-13 DIAGNOSIS — I339 Acute and subacute endocarditis, unspecified: Secondary | ICD-10-CM | POA: Diagnosis not present

## 2020-10-13 DIAGNOSIS — R531 Weakness: Secondary | ICD-10-CM | POA: Diagnosis not present

## 2020-10-13 DIAGNOSIS — N186 End stage renal disease: Secondary | ICD-10-CM | POA: Diagnosis not present

## 2020-10-13 DIAGNOSIS — N2581 Secondary hyperparathyroidism of renal origin: Secondary | ICD-10-CM | POA: Diagnosis not present

## 2020-10-13 DIAGNOSIS — B9561 Methicillin susceptible Staphylococcus aureus infection as the cause of diseases classified elsewhere: Secondary | ICD-10-CM | POA: Diagnosis not present

## 2020-10-13 DIAGNOSIS — I10 Essential (primary) hypertension: Secondary | ICD-10-CM | POA: Diagnosis not present

## 2020-10-13 DIAGNOSIS — I350 Nonrheumatic aortic (valve) stenosis: Secondary | ICD-10-CM | POA: Diagnosis not present

## 2020-10-13 DIAGNOSIS — I6310 Cerebral infarction due to embolism of unspecified precerebral artery: Secondary | ICD-10-CM | POA: Diagnosis not present

## 2020-10-13 DIAGNOSIS — I5032 Chronic diastolic (congestive) heart failure: Secondary | ICD-10-CM | POA: Diagnosis not present

## 2020-10-13 DIAGNOSIS — Z9889 Other specified postprocedural states: Secondary | ICD-10-CM | POA: Diagnosis not present

## 2020-10-13 DIAGNOSIS — R7881 Bacteremia: Secondary | ICD-10-CM | POA: Diagnosis not present

## 2020-10-13 LAB — CBC
HCT: 24.1 % — ABNORMAL LOW (ref 39.0–52.0)
Hemoglobin: 7.5 g/dL — ABNORMAL LOW (ref 13.0–17.0)
MCH: 26.8 pg (ref 26.0–34.0)
MCHC: 31.1 g/dL (ref 30.0–36.0)
MCV: 86.1 fL (ref 80.0–100.0)
Platelets: 251 10*3/uL (ref 150–400)
RBC: 2.8 MIL/uL — ABNORMAL LOW (ref 4.22–5.81)
RDW: 16 % — ABNORMAL HIGH (ref 11.5–15.5)
WBC: 12.1 10*3/uL — ABNORMAL HIGH (ref 4.0–10.5)
nRBC: 0 % (ref 0.0–0.2)

## 2020-10-13 LAB — PROTIME-INR
INR: 1.2 (ref 0.8–1.2)
Prothrombin Time: 14.3 seconds (ref 11.4–15.2)

## 2020-10-13 LAB — COMPREHENSIVE METABOLIC PANEL
ALT: 12 U/L (ref 0–44)
AST: 27 U/L (ref 15–41)
Albumin: 2.3 g/dL — ABNORMAL LOW (ref 3.5–5.0)
Alkaline Phosphatase: 52 U/L (ref 38–126)
Anion gap: 16 — ABNORMAL HIGH (ref 5–15)
BUN: 36 mg/dL — ABNORMAL HIGH (ref 6–20)
CO2: 23 mmol/L (ref 22–32)
Calcium: 8.4 mg/dL — ABNORMAL LOW (ref 8.9–10.3)
Chloride: 88 mmol/L — ABNORMAL LOW (ref 98–111)
Creatinine, Ser: 8.15 mg/dL — ABNORMAL HIGH (ref 0.61–1.24)
GFR, Estimated: 7 mL/min — ABNORMAL LOW (ref >60)
Glucose, Bld: 89 mg/dL (ref 70–99)
Potassium: 4.6 mmol/L (ref 3.5–5.1)
Sodium: 127 mmol/L — ABNORMAL LOW (ref 135–145)
Total Bilirubin: 1 mg/dL (ref 0.3–1.2)
Total Protein: 8.1 g/dL (ref 6.5–8.1)

## 2020-10-13 LAB — BLOOD GAS, ARTERIAL
Acid-Base Excess: 4 mmol/L — ABNORMAL HIGH (ref 0.0–2.0)
Bicarbonate: 28.1 mmol/L — ABNORMAL HIGH (ref 20.0–28.0)
Drawn by: 51147
FIO2: 21
O2 Saturation: 88.3 %
Patient temperature: 37
pCO2 arterial: 43.3 mmHg (ref 32.0–48.0)
pH, Arterial: 7.429 (ref 7.350–7.450)
pO2, Arterial: 57.4 mmHg — ABNORMAL LOW (ref 83.0–108.0)

## 2020-10-13 LAB — PREALBUMIN: Prealbumin: 10.8 mg/dL — ABNORMAL LOW (ref 18–38)

## 2020-10-13 LAB — PREPARE RBC (CROSSMATCH)

## 2020-10-13 LAB — APTT: aPTT: 39 seconds — ABNORMAL HIGH (ref 24–36)

## 2020-10-13 IMAGING — CR DG CHEST 2V
2 series · 2 of 2 positions shown · non-contrast
Comparison: [DATE]

CLINICAL DATA: Congestive heart failure, malaise

EXAM:
CHEST - 2 VIEW

[chest lat]
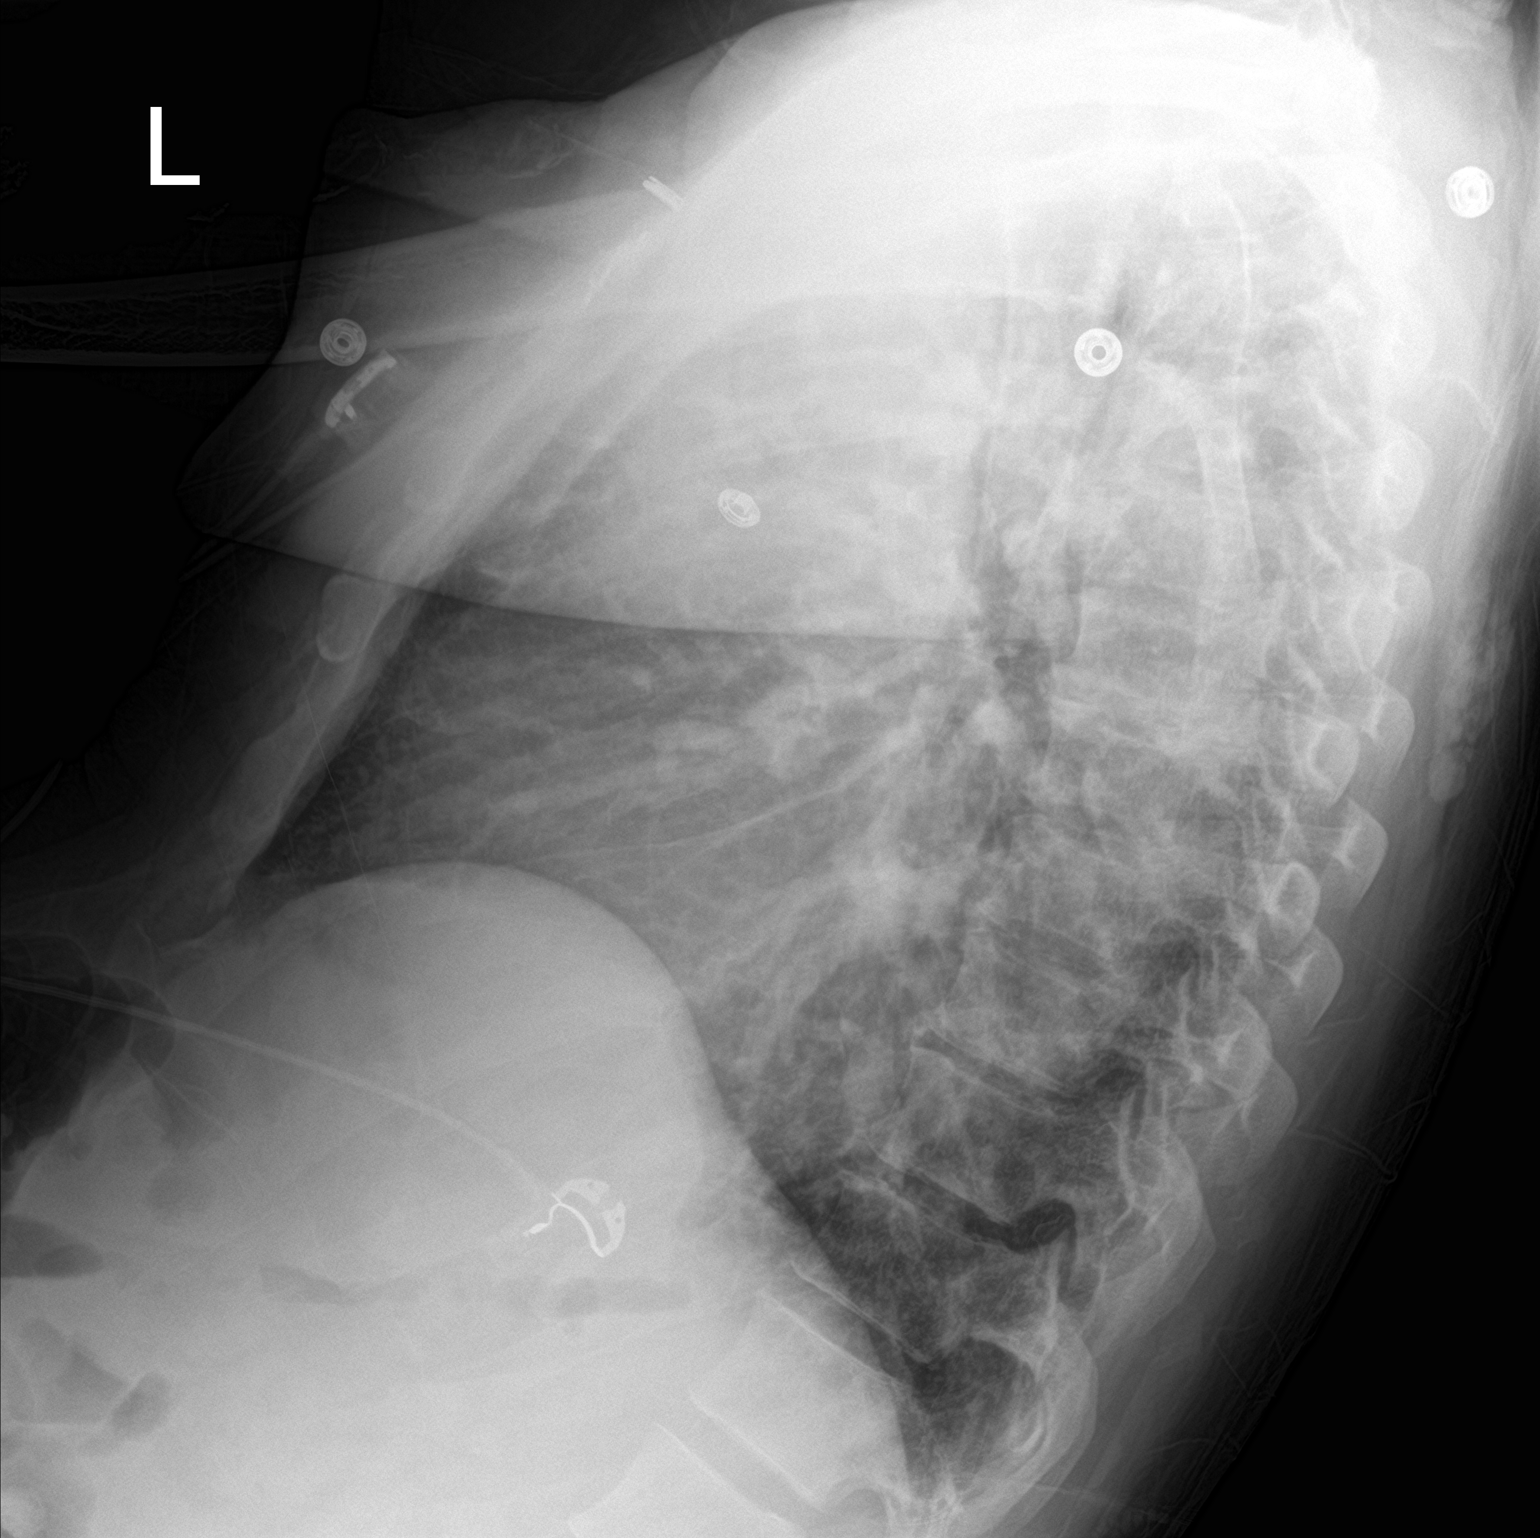

[chest ap]
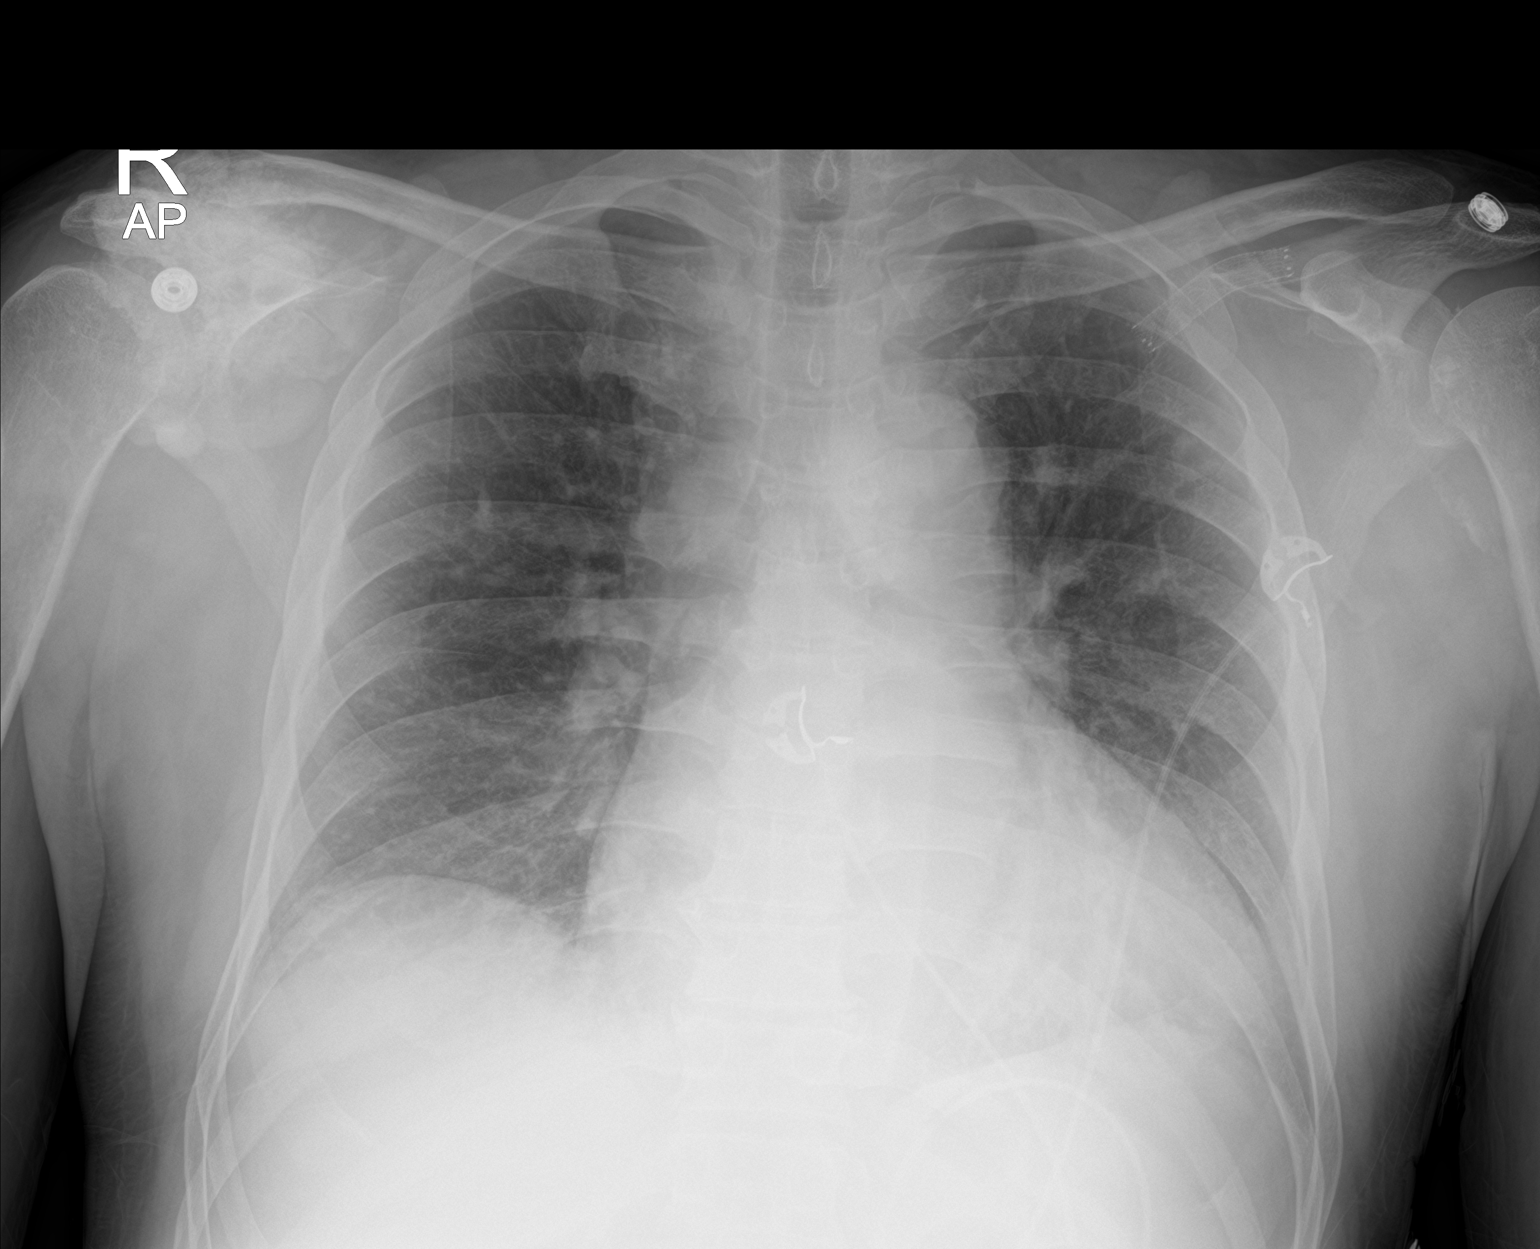

[2 of 2 positions shown; findings below may reference images not displayed]

FINDINGS: Pulmonary insufflation is normal and symmetric. Left upper lobe
peripheral cavitary nodule is grossly unchanged from prior CT
examination,. No pneumothorax or pleural effusion. Cardiac size is
mildly enlarged. Pulmonary vascularity is normal. Vascular stent
seen within the expected left subclavian vein. Amorphous
calcifications surrounding the right shoulder are compatible with
tumoral calcinosis.
IMPRESSION: No radiographic evidence of acute cardiopulmonary disease.

Stable cavitary nodule within the left upper lobe, indeterminate.

Tumoral calcinosis surrounding the right shoulder.

## 2020-10-13 MED ORDER — TRANEXAMIC ACID (OHS) PUMP PRIME SOLUTION
2.0000 mg/kg | INTRAVENOUS | Status: DC
  Filled 2020-10-13: qty 1.52

## 2020-10-13 MED ORDER — CHLORHEXIDINE GLUCONATE 0.12 % MT SOLN
15.0000 mL | Freq: Once | OROMUCOSAL | Status: AC
  Administered 2020-10-14: 15 mL via OROMUCOSAL
  Filled 2020-10-13: qty 15

## 2020-10-13 MED ORDER — PLASMA-LYTE 148 IV SOLN
INTRAVENOUS | Status: DC
  Filled 2020-10-13: qty 2.5

## 2020-10-13 MED ORDER — TRANEXAMIC ACID 1000 MG/10ML IV SOLN
1.5000 mg/kg/h | INTRAVENOUS | Status: AC
  Administered 2020-10-14: 1.5 mg/kg/h via INTRAVENOUS
  Filled 2020-10-13: qty 25

## 2020-10-13 MED ORDER — SODIUM CHLORIDE 0.9 % IV SOLN
750.0000 mg | INTRAVENOUS | Status: AC
  Administered 2020-10-14: 750 mg via INTRAVENOUS
  Filled 2020-10-13: qty 750

## 2020-10-13 MED ORDER — NOREPINEPHRINE 4 MG/250ML-% IV SOLN
0.0000 ug/min | INTRAVENOUS | Status: DC
  Filled 2020-10-13: qty 250

## 2020-10-13 MED ORDER — AMLODIPINE BESYLATE 5 MG PO TABS: 5.0000 mg | ORAL_TABLET | Freq: Every day | ORAL | Status: DC

## 2020-10-13 MED ORDER — POTASSIUM CHLORIDE 2 MEQ/ML IV SOLN
80.0000 meq | INTRAVENOUS | Status: DC
  Filled 2020-10-13: qty 40

## 2020-10-13 MED ORDER — SODIUM CHLORIDE 0.9 % IV SOLN: 100.0000 mL | INTRAVENOUS | Status: DC | PRN

## 2020-10-13 MED ORDER — TRANEXAMIC ACID (OHS) BOLUS VIA INFUSION
15.0000 mg/kg | INTRAVENOUS | Status: AC
  Administered 2020-10-14: 1140 mg via INTRAVENOUS
  Filled 2020-10-13: qty 1140

## 2020-10-13 MED ORDER — DOXERCALCIFEROL 4 MCG/2ML IV SOLN
INTRAVENOUS | Status: AC
  Filled 2020-10-13: qty 2

## 2020-10-13 MED ORDER — DARBEPOETIN ALFA 100 MCG/0.5ML IJ SOSY
PREFILLED_SYRINGE | INTRAMUSCULAR | Status: AC
  Filled 2020-10-13: qty 0.5

## 2020-10-13 MED ORDER — SODIUM CHLORIDE 0.9 % IV SOLN
INTRAVENOUS | Status: DC
  Filled 2020-10-13: qty 30

## 2020-10-13 MED ORDER — MANNITOL 20 % IV SOLN
Freq: Once | INTRAVENOUS | Status: DC
  Filled 2020-10-13: qty 13

## 2020-10-13 MED ORDER — LIDOCAINE-PRILOCAINE 2.5-2.5 % EX CREA
1.0000 "application " | TOPICAL_CREAM | CUTANEOUS | Status: DC | PRN
  Filled 2020-10-13: qty 5

## 2020-10-13 MED ORDER — DEXMEDETOMIDINE HCL IN NACL 400 MCG/100ML IV SOLN
0.1000 ug/kg/h | INTRAVENOUS | Status: AC
  Administered 2020-10-14: .5 ug/kg/h via INTRAVENOUS
  Filled 2020-10-13: qty 100

## 2020-10-13 MED ORDER — PENTAFLUOROPROP-TETRAFLUOROETH EX AERO: 1.0000 "application " | INHALATION_SPRAY | CUTANEOUS | Status: DC | PRN

## 2020-10-13 MED ORDER — NITROGLYCERIN IN D5W 200-5 MCG/ML-% IV SOLN
2.0000 ug/min | INTRAVENOUS | Status: DC
  Filled 2020-10-13: qty 250

## 2020-10-13 MED ORDER — INSULIN REGULAR(HUMAN) IN NACL 100-0.9 UT/100ML-% IV SOLN
INTRAVENOUS | Status: AC
  Administered 2020-10-14: .8 [IU]/h via INTRAVENOUS
  Filled 2020-10-13: qty 100

## 2020-10-13 MED ORDER — METOPROLOL TARTRATE 12.5 MG HALF TABLET
12.5000 mg | ORAL_TABLET | Freq: Once | ORAL | Status: AC
  Administered 2020-10-14: 12.5 mg via ORAL
  Filled 2020-10-13: qty 1

## 2020-10-13 MED ORDER — CHLORHEXIDINE GLUCONATE CLOTH 2 % EX PADS: 6.0000 | MEDICATED_PAD | Freq: Once | CUTANEOUS | Status: AC

## 2020-10-13 MED ORDER — VANCOMYCIN HCL IN DEXTROSE 500-5 MG/100ML-% IV SOLN
INTRAVENOUS | Status: AC
  Filled 2020-10-13: qty 100

## 2020-10-13 MED ORDER — HEPARIN SODIUM (PORCINE) 1000 UNIT/ML DIALYSIS
2500.0000 [IU] | Freq: Once | INTRAMUSCULAR | Status: DC
  Filled 2020-10-13: qty 3

## 2020-10-13 MED ORDER — TEMAZEPAM 15 MG PO CAPS: 15.0000 mg | ORAL_CAPSULE | Freq: Once | ORAL | Status: DC | PRN

## 2020-10-13 MED ORDER — CHLORHEXIDINE GLUCONATE CLOTH 2 % EX PADS
6.0000 | MEDICATED_PAD | Freq: Once | CUTANEOUS | Status: AC
  Administered 2020-10-13: 6 via TOPICAL

## 2020-10-13 MED ORDER — BISACODYL 5 MG PO TBEC
5.0000 mg | DELAYED_RELEASE_TABLET | Freq: Once | ORAL | Status: AC
  Administered 2020-10-13: 5 mg via ORAL
  Filled 2020-10-13: qty 1

## 2020-10-13 MED ORDER — VANCOMYCIN HCL 1250 MG/250ML IV SOLN
1250.0000 mg | INTRAVENOUS | Status: AC
  Administered 2020-10-14: 1250 mg via INTRAVENOUS
  Filled 2020-10-13: qty 250

## 2020-10-13 MED ORDER — GLUTARALDEHYDE 0.625% SOAKING SOLUTION
TOPICAL | Status: DC
  Filled 2020-10-13: qty 50

## 2020-10-13 MED ORDER — EPINEPHRINE HCL 5 MG/250ML IV SOLN IN NS
0.0000 ug/min | INTRAVENOUS | Status: DC
  Filled 2020-10-13: qty 250

## 2020-10-13 MED ORDER — SODIUM CHLORIDE 0.9 % IV SOLN
1.5000 g | INTRAVENOUS | Status: AC
  Administered 2020-10-14: 1.5 g via INTRAVENOUS
  Filled 2020-10-13: qty 1.5

## 2020-10-13 MED ORDER — VANCOMYCIN HCL 1000 MG IV SOLR
INTRAVENOUS | Status: DC
  Filled 2020-10-13: qty 1000

## 2020-10-13 MED ORDER — LIDOCAINE HCL (PF) 1 % IJ SOLN: 5.0000 mL | INTRAMUSCULAR | Status: DC | PRN

## 2020-10-13 MED ORDER — PHENYLEPHRINE HCL-NACL 20-0.9 MG/250ML-% IV SOLN
30.0000 ug/min | INTRAVENOUS | Status: AC
  Administered 2020-10-14: 25 ug/min via INTRAVENOUS
  Filled 2020-10-13: qty 250

## 2020-10-13 MED ORDER — MILRINONE LACTATE IN DEXTROSE 20-5 MG/100ML-% IV SOLN
0.3000 ug/kg/min | INTRAVENOUS | Status: DC
  Filled 2020-10-13: qty 100

## 2020-10-13 NOTE — Progress Notes (Signed)
   10/13/20 2046  Assess: MEWS Score  Temp (!) 101.3 F (38.5 C)  BP 134/86  ECG Heart Rate (!) 116  Resp (!) 22  SpO2 97 %  O2 Device Room Air  Assess: MEWS Score  MEWS Temp 1  MEWS Systolic 0  MEWS Pulse 2  MEWS RR 1  MEWS LOC 0  MEWS Score 4  MEWS Score Color Red  Assess: if the MEWS score is Yellow or Red  Were vital signs taken at a resting state? Yes  Focused Assessment No change from prior assessment  Early Detection of Sepsis Score *See Row Information* High  MEWS guidelines implemented *See Row Information* Yes  Treat  MEWS Interventions Escalated (See documentation below)  Pain Scale 0-10  Pain Score 3  Pain Type Acute pain;Surgical pain  Pain Location Mouth  Pain Orientation Lower  Pain Descriptors / Indicators Throbbing  Pain Intervention(s) Medication (See eMAR)  Take Vital Signs  Increase Vital Sign Frequency  Red: Q 1hr X 4 then Q 4hr X 4, if remains red, continue Q 4hrs  Escalate  MEWS: Escalate Red: discuss with charge nurse/RN and provider, consider discussing with RRT  Notify: Charge Nurse/RN  Name of Charge Nurse/RN Notified Microbiologist  Date Charge Nurse/RN Notified 10/13/20  Time Charge Nurse/RN Notified 2055  Notify: Provider  Provider Name/Title Konrad Felix MD  Date Provider Notified 10/13/20  Time Provider Notified 2056  Notification Type Page  Notification Reason Other (Comment) (Red MEWS)  Provider response Other (Comment) (awaiting response)  Document  Patient Outcome Other (Comment) (remains on unit)  Progress note created (see row info) Yes

## 2020-10-13 NOTE — Anesthesia Postprocedure Evaluation (Signed)
Anesthesia Post Note  Patient: Patrick Anthony  Procedure(s) Performed: MULTIPLE EXTRACTION WITH ALVEOLOPLASTY (N/A Mouth)     Patient location during evaluation: PACU Anesthesia Type: General Level of consciousness: awake and alert and oriented Pain management: pain level controlled Vital Signs Assessment: post-procedure vital signs reviewed and stable Respiratory status: spontaneous breathing, nonlabored ventilation and respiratory function stable Cardiovascular status: blood pressure returned to baseline Postop Assessment: no apparent nausea or vomiting Anesthetic complications: no   No complications documented.             Brennan Bailey

## 2020-10-13 NOTE — Progress Notes (Signed)
OT Cancellation Note and Discharge  Patient Details Name: Patrick Anthony MRN: [id number] DOB: July 02, 1971   Cancelled Treatment:    Reason Eval/Treat Not Completed:  Pt with multiple refusals for OT visits now declining further OT services. Pt has been educated in importance of activity. Will sign off.  Malka So 10/13/2020, 3:36 PM  Nestor Lewandowsky, OTR/L Acute Rehabilitation Services Pager: [phone] Office: [phone]

## 2020-10-13 NOTE — Progress Notes (Signed)
[address]       [place] [address]             [phone]     CARDIOTHORACIC SURGERY PROGRESS NOTE  1 Day Post-Op  S/P Procedure(s) (LRB): MULTIPLE EXTRACTION WITH ALVEOLOPLASTY (N/A)  Subjective: Mild soreness in mouth.  Otherwise no complaints  Objective: Vital signs in last 24 hours: Temp:  [97 F (36.1 C)-99.5 F (37.5 C)] 98.5 F (36.9 C) (03/09 0458) Pulse Rate:  [72-96] 96 (03/08 2209) Cardiac Rhythm: Normal sinus rhythm (03/08 1900) Resp:  [12-20] 18 (03/09 0458) BP: (106-182)/(46-100) 126/93 (03/09 0458) SpO2:  [93 %-100 %] 95 % (03/09 0458) Weight:  [76 kg] 76 kg (03/09 [id number])  Physical Exam:  Rhythm:   sinus  Breath sounds: clear  Heart sounds:  RRR w/ systolic murmur  Incisions:  n/a  Abdomen:  Soft, non-distended, non-tender  Extremities:  Warm, well-perfused    Intake/Output from previous day: 03/08 0701 - 03/09 0700 In: 43 [P.O.:240; I.V.:400; IV Piggyback:250] Out: -  Intake/Output this shift: No intake/output data recorded.  Lab Results: Recent Labs    10/11/20 1203 10/12/20 1226 10/13/20 0145  WBC 14.9*  --  12.1*  HGB 7.5* 9.2* 7.5*  HCT 22.8* 27.0* 24.1*  PLT 322  --  251   BMET:  Recent Labs    10/11/20 1203 10/12/20 1226 10/13/20 0145  NA 129* 131* 127*  K 5.0 4.9 4.6  CL 90* 92* 88*  CO2 23  --  23  GLUCOSE 109* 93 89  BUN 56* 34* 36*  CREATININE 11.02* 7.00* 8.15*  CALCIUM 8.1*  --  8.4*    CBG (last 3)  No results for input(s): GLUCAP in the last 72 hours. PT/INR:   Recent Labs    10/13/20 0145  LABPROT 14.3  INR 1.2    CXR:  CHEST - 2 VIEW  COMPARISON:  10/06/2020  FINDINGS: Pulmonary insufflation is normal and symmetric. Left upper lobe peripheral cavitary nodule is grossly unchanged from prior CT examination,. No pneumothorax or pleural effusion. Cardiac size is mildly enlarged. Pulmonary vascularity is normal. Vascular stent seen within the expected left subclavian vein.  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  IMPRESSION: No radiographic evidence of acute cardiopulmonary disease.  Stable cavitary nodule within the left upper lobe, indeterminate.  Tumoral calcinosis surrounding the right shoulder.   Electronically Signed   By: Fidela Salisbury MD   On: 10/13/2020 06:52  Assessment/Plan: S/P Procedure(s) (LRB): MULTIPLE EXTRACTION WITH ALVEOLOPLASTY (N/A)  The patient was again counseled at length regarding his diagnosis of bacterial endocarditis with primary mitral regurgitation.  We discussed the results of their diagnostic tests including his recent transesophageal echocardiogram.  We discussed the natural history of complicated bacterial endocarditis with mitral regurgitation as well as alternative treatment strategies.  We discussed the impact of his age, current state of health, and significant comorbid medical problems (most importantly dialysis-dependent renal failure) on clinical decision making.  We went on to discuss the indications, risks and potential benefits of mitral valve repair or replacement.  The rationale for surgical intervention has been explained, including a comparison between surgery and continued medical therapy with close follow-up.  The likelihood of successful and durable mitral valve repair has been discussed with particular reference to the findings of the most recent echocardiogram.  Based upon these findings and previous experience, I have quoted a less than 50 percent likelihood of successful valve  repair, and he understands that surgery will come with relatively high risk of mortality or major morbidity.  Alternative surgical approaches have been discussed including a comparison between conventional sternotomy and minimally-invasive techniques.  The relative risks and benefits of each have been reviewed as they pertain to the patient's specific circumstances, and  expectations for the patient's postoperative convalescence has been discussed.  In the event that the patient's valve cannot be successfully repaired, we discussed the possibility of replacing the mitral valve using a mechanical prosthesis with the attendant need for long-term anticoagulation using warfarin versus the alternative of replacing it using a bioprosthetic tissue valve with its potential for late structural valve deterioration and failure, depending upon the patient's longevity.  We also discussed how the presence of end stage renal disease affects decision making, including the potential for premature calcification and failure of bioprosthetic tissue valves.  The patient specifically requests that if the mitral valve must be replaced that it be done using a bioprosthetic tissue valve.   The patient desires to proceed with surgery tomorrow.    The patient understands and accepts all potential risks of surgery including but not limited to risk of death, stroke or other neurologic complication, myocardial infarction, congestive heart failure, respiratory failure, renal failure, bleeding requiring transfusion and/or reexploration, arrhythmia, heart block or bradycardia requiring permanent pacemaker insertion, infection or other wound complications, pneumonia, pleural and/or pericardial effusion, pulmonary embolus, aortic dissection or other major vascular complication, or other immediate or delayed complications related to valve repair or replacement including but not limited to recurrent or persistent mitral regurgitation and/or mitral stenosis, LV outflow tract obstruction, aortic insufficiency, paravalvular leak, posterior AV groove disruption, structural valve deterioration and failure, thrombosis, embolization, or recurrent endocarditis.  Specific risks potentially related to the minimally-invasive approach were discussed at length, including but not limited to risk of conversion to full or partial  sternotomy, aortic dissection or other major vascular complication, unilateral acute lung injury or pulmonary edema, phrenic nerve dysfunction or paralysis, rib fracture, chronic pain, lung hernia, or lymphocele. All of his questions have been answered.   I spent in excess of 15 minutes during the conduct of this hospital encounter and >50% of this time involved direct face-to-face encounter with the patient for counseling and/or coordination of their care.  Rexene Alberts, MD 10/13/2020 9:51 AM

## 2020-10-13 NOTE — H&P (View-Only) (Signed)
[address]       [place] [address]             [phone]     CARDIOTHORACIC SURGERY PROGRESS NOTE  1 Day Post-Op  S/P Procedure(s) (LRB): MULTIPLE EXTRACTION WITH ALVEOLOPLASTY (N/A)  Subjective: Mild soreness in mouth.  Otherwise no complaints  Objective: Vital signs in last 24 hours: Temp:  [97 F (36.1 C)-99.5 F (37.5 C)] 98.5 F (36.9 C) (03/09 0458) Pulse Rate:  [72-96] 96 (03/08 2209) Cardiac Rhythm: Normal sinus rhythm (03/08 1900) Resp:  [12-20] 18 (03/09 0458) BP: (106-182)/(46-100) 126/93 (03/09 0458) SpO2:  [93 %-100 %] 95 % (03/09 0458) Weight:  [76 kg] 76 kg (03/09 [id number])  Physical Exam:  Rhythm:   sinus  Breath sounds: clear  Heart sounds:  RRR w/ systolic murmur  Incisions:  n/a  Abdomen:  Soft, non-distended, non-tender  Extremities:  Warm, well-perfused    Intake/Output from previous day: 03/08 0701 - 03/09 0700 In: 70 [P.O.:240; I.V.:400; IV Piggyback:250] Out: -  Intake/Output this shift: No intake/output data recorded.  Lab Results: Recent Labs    10/11/20 1203 10/12/20 1226 10/13/20 0145  WBC 14.9*  --  12.1*  HGB 7.5* 9.2* 7.5*  HCT 22.8* 27.0* 24.1*  PLT 322  --  251   BMET:  Recent Labs    10/11/20 1203 10/12/20 1226 10/13/20 0145  NA 129* 131* 127*  K 5.0 4.9 4.6  CL 90* 92* 88*  CO2 23  --  23  GLUCOSE 109* 93 89  BUN 56* 34* 36*  CREATININE 11.02* 7.00* 8.15*  CALCIUM 8.1*  --  8.4*    CBG (last 3)  No results for input(s): GLUCAP in the last 72 hours. PT/INR:   Recent Labs    10/13/20 0145  LABPROT 14.3  INR 1.2    CXR:  CHEST - 2 VIEW  COMPARISON:  10/06/2020  FINDINGS: Pulmonary insufflation is normal and symmetric. Left upper lobe peripheral cavitary nodule is grossly unchanged from prior CT examination,. No pneumothorax or pleural effusion. Cardiac size is mildly enlarged. Pulmonary vascularity is normal. Vascular stent seen within the expected left subclavian vein.  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  IMPRESSION: No radiographic evidence of acute cardiopulmonary disease.  Stable cavitary nodule within the left upper lobe, indeterminate.  Tumoral calcinosis surrounding the right shoulder.   Electronically Signed   By: Fidela Salisbury MD   On: 10/13/2020 06:52  Assessment/Plan: S/P Procedure(s) (LRB): MULTIPLE EXTRACTION WITH ALVEOLOPLASTY (N/A)  The patient was again counseled at length regarding his diagnosis of bacterial endocarditis with primary mitral regurgitation.  We discussed the results of their diagnostic tests including his recent transesophageal echocardiogram.  We discussed the natural history of complicated bacterial endocarditis with mitral regurgitation as well as alternative treatment strategies.  We discussed the impact of his age, current state of health, and significant comorbid medical problems (most importantly dialysis-dependent renal failure) on clinical decision making.  We went on to discuss the indications, risks and potential benefits of mitral valve repair or replacement.  The rationale for surgical intervention has been explained, including a comparison between surgery and continued medical therapy with close follow-up.  The likelihood of successful and durable mitral valve repair has been discussed with particular reference to the findings of the most recent echocardiogram.  Based upon these findings and previous experience, I have quoted a less than 50 percent likelihood of successful valve  repair, and he understands that surgery will come with relatively high risk of mortality or major morbidity.  Alternative surgical approaches have been discussed including a comparison between conventional sternotomy and minimally-invasive techniques.  The relative risks and benefits of each have been reviewed as they pertain to the patient's specific circumstances, and  expectations for the patient's postoperative convalescence has been discussed.  In the event that the patient's valve cannot be successfully repaired, we discussed the possibility of replacing the mitral valve using a mechanical prosthesis with the attendant need for long-term anticoagulation using warfarin versus the alternative of replacing it using a bioprosthetic tissue valve with its potential for late structural valve deterioration and failure, depending upon the patient's longevity.  We also discussed how the presence of end stage renal disease affects decision making, including the potential for premature calcification and failure of bioprosthetic tissue valves.  The patient specifically requests that if the mitral valve must be replaced that it be done using a bioprosthetic tissue valve.   The patient desires to proceed with surgery tomorrow.    The patient understands and accepts all potential risks of surgery including but not limited to risk of death, stroke or other neurologic complication, myocardial infarction, congestive heart failure, respiratory failure, renal failure, bleeding requiring transfusion and/or reexploration, arrhythmia, heart block or bradycardia requiring permanent pacemaker insertion, infection or other wound complications, pneumonia, pleural and/or pericardial effusion, pulmonary embolus, aortic dissection or other major vascular complication, or other immediate or delayed complications related to valve repair or replacement including but not limited to recurrent or persistent mitral regurgitation and/or mitral stenosis, LV outflow tract obstruction, aortic insufficiency, paravalvular leak, posterior AV groove disruption, structural valve deterioration and failure, thrombosis, embolization, or recurrent endocarditis.  Specific risks potentially related to the minimally-invasive approach were discussed at length, including but not limited to risk of conversion to full or partial  sternotomy, aortic dissection or other major vascular complication, unilateral acute lung injury or pulmonary edema, phrenic nerve dysfunction or paralysis, rib fracture, chronic pain, lung hernia, or lymphocele. All of his questions have been answered.   I spent in excess of 15 minutes during the conduct of this hospital encounter and >50% of this time involved direct face-to-face encounter with the patient for counseling and/or coordination of their care.  Rexene Alberts, MD 10/13/2020 9:51 AM

## 2020-10-13 NOTE — Progress Notes (Signed)
3/9 2025 - Paged TCTS on-call MD as there are no pre-op orders for patients scheduled MVR tomorrow 3/10. MD Lightfoot notified, will await orders.

## 2020-10-13 NOTE — Progress Notes (Signed)
HOSPITAL MEDICINE OVERNIGHT EVENT NOTE    Notified by nursing that patient is exhibiting fever of 102.38F, MEWS is red.  Temperature seems to be coming down and is currently 100.3 status post 650 mg of Tylenol administration.  Patient is lethargic but arousable and oriented x3.  Patient is currently receiving intravenous vancomycin and is to undergo mitral valve surgery on 3/10.  Patient is currently hemodynamically stable.  In setting of new onset fever however will repeat blood cultures.  Continuing to monitor patient closely for change in clinical status.  Patrick Emerald  MD [facility]

## 2020-10-13 NOTE — Progress Notes (Signed)
  Speech Language Pathology Treatment: Cognitive-Linquistic  Patient Details Name: Patrick Anthony MRN: [id number] DOB: 1971-01-19 Today's Date: 10/13/2020 Time: [id number] SLP Time Calculation (min) (ACUTE ONLY): 15 min  Assessment / Plan / Recommendation Clinical Impression  Pt was seen for ongoing cognitive-linguistic tx with emphasis this session on differential diagnosis of abilities given improvements noted particularly in communication skills since initial evaluation. Pt is now communicating at more of a conversational level. He is oriented to location and situation, recalling broad details of daily events well. Pt read through his meal ticket with Min cues provided to increase accuracy. He needed assistance remembering what dietary restrictions he has at the moment (soft foods post-extraction and heart healthy), and SLP also provided Mod cues for functional problem solving to identify appropriate food selections from the menu. After rehearsing what he wanted to order, pt still needed Min assistance to recall the appropriate food selections. He will continue to benefit from ongoing SLP services, focusing more so on cognition at this time.    HPI HPI: Pt is a 50 y.o male with sepsis likely due to pneumonia. MRI 2/22 showed scattered small acute infarcts within the bilateral cerebral  hemispheres, right basal ganglia, callosal splenium and bilateral cerebellar hemispheres. CXR from 2/21 revealed low lung volumes with streaky left basilar opacities which could  represent atelectasis or infection. PMH with significant history for ESRD on dialysis, hypertension, and CHF.      SLP Plan  Continue with current plan of care       Recommendations                   Follow up Recommendations: Skilled Nursing facility SLP Visit Diagnosis: Cognitive communication deficit [id number]) Plan: Continue with current plan of care       GO                Osie Bond., M.A. [facility] Pager [phone] Office [phone]  10/13/2020, 2:57 PM

## 2020-10-13 NOTE — Progress Notes (Signed)
[facility] Progress Note   Subjective: Seen in room. Mouth is sore post dental extraction. For HD today 2nd shift. BP continues to fall, soft this AM. Medications adjusted again. For MVR tomorrow per Dr. Roxy Manns.     Objective Vitals:   10/12/20 2033 10/12/20 2209 10/13/20 0458 10/13/20 0608  BP: (!) 136/100 123/84 (!) 126/93   Pulse:  96    Resp: 18  18   Temp: 98.7 F (37.1 C)  98.5 F (36.9 C)   TempSrc: Axillary  Axillary   SpO2: 96%  95%   Weight:    76 kg  Height:       Physical Exam General:Chronically ill appearing male in NAD [id number] 3/6 systolic M. Lungs:CTAB Abdomen:Active BS. S, NT Extremities:No LE Edema. Dialysis Access:L AVG + Bruit   Additional Objective Labs: Basic Metabolic Panel: Recent Labs  Lab 10/06/20 1529 10/07/20 0401 10/08/20 0335 10/11/20 1203 10/12/20 1226 10/13/20 0145  NA 133*   < > 132* 129* 131* 127*  K 3.9   < > 4.6 5.0 4.9 4.6  CL 93*   < > 93* 90* 92* 88*  CO2 22   < > 25 23  --  23  GLUCOSE 109*   < > 114* 109* 93 89  BUN 77*   < > 43* 56* 34* 36*  CREATININE 10.60*   < > 8.59* 11.02* 7.00* 8.15*  CALCIUM 7.3*   < > 8.5* 8.1*  --  8.4*  PHOS 8.3*  --   --  7.8*  --   --    < > = values in this interval not displayed.   Liver Function Tests: Recent Labs  Lab 10/07/20 0401 10/11/20 1203 10/13/20 0145  AST 33  --  27  ALT 16  --  12  ALKPHOS 57  --  52  BILITOT 0.8  --  1.0  PROT 7.8  --  8.1  ALBUMIN 2.2* 2.1* 2.3*   No results for input(s): LIPASE, AMYLASE in the last 168 hours. CBC: Recent Labs  Lab 10/06/20 1529 10/07/20 0401 10/07/20 0952 10/08/20 0335 10/11/20 1203 10/12/20 1226 10/13/20 0145  WBC 18.7* 19.1*  --  19.2* 14.9*  --  12.1*  HGB 8.1* 8.4*   < > 9.3* 7.5* 9.2* 7.5*  HCT 24.5* 25.4*   < > 29.7* 22.8* 27.0* 24.1*  MCV 83.3 83.6  --  85.6 84.4  --  86.1  PLT 221 270  --  295 322  --  251   < > = values in this interval not displayed.   Blood Culture     Component Value Date/Time   SDES BLOOD LEFT ARM 10/03/2020 0242   SDES BLOOD LEFT HAND 10/03/2020 0242   SPECREQUEST  10/03/2020 0242    BOTTLES DRAWN AEROBIC AND ANAEROBIC Blood Culture results may not be optimal due to an inadequate volume of blood received in culture bottles   SPECREQUEST  10/03/2020 0242    BOTTLES DRAWN AEROBIC ONLY Blood Culture results may not be optimal due to an inadequate volume of blood received in culture bottles   CULT  10/03/2020 0242    NO GROWTH 5 DAYS Performed at [facility], [place] [address] [place], [place] [address]    CULT  10/03/2020 0242    NO GROWTH 5 DAYS Performed at [facility], [place] [address] [place], [place] [address]    REPTSTATUS 10/08/2020 FINAL 10/03/2020 0242   REPTSTATUS 10/08/2020 FINAL 10/03/2020 0242  Cardiac Enzymes: No results for input(s): CKTOTAL, CKMB, CKMBINDEX, TROPONINI in the last 168 hours. CBG: No results for input(s): GLUCAP in the last 168 hours. Iron Studies: No results for input(s): IRON, TIBC, TRANSFERRIN, FERRITIN in the last 72 hours. '@lablastinr3'$ @ Studies/Results: DG Chest 2 View  Result Date: 10/13/2020 CLINICAL DATA:  Congestive heart failure, malaise EXAM: CHEST - 2 VIEW COMPARISON:  10/06/2020 FINDINGS: Pulmonary insufflation is normal and symmetric. Left upper lobe peripheral cavitary nodule is grossly unchanged from prior CT examination,. No pneumothorax or pleural effusion. Cardiac size is mildly enlarged. Pulmonary vascularity is normal. Vascular stent seen within the expected left subclavian vein. 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 IMPRESSION: No radiographic evidence of acute cardiopulmonary disease. Stable cavitary nodule within the left upper lobe, indeterminate. Tumoral calcinosis surrounding the right shoulder. Electronically Signed   By: Fidela Salisbury MD   On: 10/13/2020 06:52   Medications: . sodium chloride    .  [START ON 10/14/2020] cefUROXime (ZINACEF)  IV    . [START ON 10/14/2020] cefUROXime (ZINACEF)  IV    . [START ON 10/14/2020] dexmedetomidine    . [START ON 10/14/2020] heparin 30,000 units/NS 1000 mL solution for CELLSAVER    . lactated ringers Stopped (10/12/20 1627)  . [START ON 10/14/2020] milrinone    . [START ON 10/14/2020] nitroGLYCERIN    . [START ON 10/14/2020] norepinephrine    . [START ON 10/14/2020] tranexamic acid (CYKLOKAPRON) infusion ([facility])    . [START ON 10/14/2020] vancomycin     . (feeding supplement) PROSource Plus  30 mL Oral BID BM  . amLODipine  10 mg Oral Daily  . calcium acetate  1,334 mg Oral TID WC  . calcium acetate  667 mg Oral With snacks  . Chlorhexidine Gluconate Cloth  6 each Topical Q0600  . Chlorhexidine Gluconate Cloth  6 each Topical Q0600  . Chlorhexidine Gluconate Cloth  6 each Topical Q0600  . cinacalcet  30 mg Oral Q breakfast  . darbepoetin (ARANESP) injection - DIALYSIS  100 mcg Intravenous Q Wed-HD  . doxercalciferol  3 mcg Intravenous Q M,W,F-HD  . [START ON 10/14/2020] epinephrine  0-10 mcg/min Intravenous To OR  . feeding supplement (NEPRO CARB STEADY)  237 mL Oral BID BM  . [START ON 10/14/2020] glutaraldehyde   Topical On Call to OR  . [START ON 10/14/2020] heparin-papaverine-plasmalyte irrigation   Irrigation To OR  . hydrALAZINE  25 mg Oral BID  . influenza vac split quadrivalent PF  0.5 mL Intramuscular Tomorrow-1000  . [START ON 10/14/2020] insulin   Intravenous To OR  . [place] Blood Cardioplegia vial (lidocaine/magnesium/mannitol 0.26g-4g-6.4g)   Intracoronary Once  . metoprolol tartrate  75 mg Oral BID  . multivitamin  1 tablet Oral QHS  . mupirocin ointment  1 application Nasal BID  . [START ON 10/14/2020] phenylephrine  30-200 mcg/min Intravenous To OR  . [START ON 10/14/2020] potassium chloride  80 mEq Other To OR  . sodium chloride flush  3 mL Intravenous Q12H  . [START ON 10/14/2020] tranexamic acid  15 mg/kg Intravenous To OR  .  [START ON 10/14/2020] tranexamic acid  2 mg/kg Intracatheter To OR  . [START ON 10/14/2020] vancomycin 1000 mg in NS (1000 ml) irrigation for Dr. Roxy Manns case   Irrigation To OR  . vancomycin  500 mg Intravenous Q M,W,F-HD     OP HD:MWF [facility] 4h 350/500 82.5kg 2/2.5 bath P1 LUA AVG  -Heparin 5000 units IV TIW -Aranesp 72mg IV  q 2wks - last 09/27/20 (Verified order) -Hectorol75mg IV qHD   Assessment/Plan: 1. MRSA bacteremia/MV endocarditis: w/ fevers and [facility]. Cavitary mass LUL possibly septic emboli. ID following on Vancomycin. Has LAVG w/oovertsigns of infection. Seen by Dr. ORoxy Manns MVR 10/14/20. S/P tooth extraction3/8/22.Per Primary. 2. 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 AMS -Resolved. 4. ESRD - usual HD MWF. Next HD 10/12/20.  5. Hypertension/volume-BP controlled. H/O resistant hypertension and medical noncompliance, BP historically difficult to control. Hehas beenon 4antihypertensive medications prior to admission. (amlodipine '10mg'$ , clonidinetransdermal, hydralazine '50mg'$  POBID, metoprolol 75 mg POBID).BP lower than normal here probably due to medications being given to him. Clonidine DC'd.  Decreased hydralazine 10/12/20. BP soft this AM. Decrease amlodipine to 5 mg PO q HS. Now under OP EDW. UF as tolerated and lower EDW on discharge. 6. Anemiaof CKD-HGB 7.5 10/11/20. Transfuse tomorrow on HD prior to surgery on 10/14/20. Continue weekly ESA. Tsat low, holding Fe load D/T infection.  7. Secondary Hyperparathyroidism -Ca in goal, phoselevated-chronic issue with binder compliance. . Continue hectorol, sensipar and phoslo. 8. Nutrition- Renal diet w/fluid restrictionon Nepro Albumin 2.2Add protein supps. 9. Hx Hep C /liver cirrhosis.  Kamden Stanislaw H. Kamoni Depree NP-C 10/13/2020, 10:44 AM  CNewell [phone]

## 2020-10-13 NOTE — Progress Notes (Signed)
[facility]    Date of Admission:  09/27/2020   Total days of antibiotics 5           ID: Patrick Anthony is a 50 y.o. 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 Principal Problem:   Bacterial endocarditis Active Problems:   Chronic combined systolic and diastolic congestive heart failure (HCC)   HTN (hypertension)   Anemia in chronic kidney disease   ESRD on dialysis ([place])   End stage renal disease (HCC)   Sepsis ([place])   Pneumonia due to infectious agent   ADPKD (autosomal dominant polycystic kidney disease)   Sepsis due to pneumonia (HCC)   Lactic acidosis   Cerebral embolism with cerebral infarction   Bacteremia due to Staphylococcus aureus   Mitral regurgitation   Uncontrolled hypertension   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 (feeding supplement) PROSource Plus  30 mL Oral BID BM  . [START ON 10/14/2020] amLODipine  5 mg Oral QHS  . calcium acetate  1,334 mg Oral TID WC  . calcium acetate  667 mg Oral With snacks  . Chlorhexidine Gluconate Cloth  6 each Topical Q0600  . Chlorhexidine Gluconate Cloth  6 each Topical Q0600  . Chlorhexidine Gluconate Cloth  6 each Topical Q0600  . cinacalcet  30 mg Oral Q breakfast  . darbepoetin (ARANESP) injection - DIALYSIS  100 mcg Intravenous Q Wed-HD  . doxercalciferol  3 mcg Intravenous Q M,W,F-HD  . [START ON 10/14/2020] epinephrine  0-10 mcg/min Intravenous To OR  . feeding supplement (NEPRO CARB STEADY)  237 mL Oral BID BM  . [START ON 10/14/2020] glutaraldehyde   Topical On Call to OR  . heparin  2,500 Units Dialysis Once in dialysis  . [START ON 10/14/2020] heparin-papaverine-plasmalyte  irrigation   Irrigation To OR  . hydrALAZINE  25 mg Oral BID  . influenza vac split quadrivalent PF  0.5 mL Intramuscular Tomorrow-1000  . [START ON 10/14/2020] insulin   Intravenous To OR  . [place] Blood Cardioplegia vial (lidocaine/magnesium/mannitol 0.26g-4g-6.4g)   Intracoronary Once  . metoprolol tartrate  75 mg Oral BID  . multivitamin  1 tablet Oral QHS  . mupirocin ointment  1 application Nasal BID  . [START ON 10/14/2020] phenylephrine  30-200 mcg/min Intravenous To OR  . [START ON 10/14/2020] potassium chloride  80 mEq Other To OR  . sodium chloride flush  3 mL Intravenous Q12H  . [START ON 10/14/2020] tranexamic acid  15 mg/kg Intravenous To OR  . [START ON 10/14/2020] tranexamic acid  2 mg/kg Intracatheter To OR  . [START ON 10/14/2020] vancomycin 1000 mg in NS (1000 ml) irrigation for Dr. Roxy Manns case   Irrigation To OR  . vancomycin  500 mg Intravenous Q M,W,F-HD    Objective: Vital signs in last 24 hours: Temp:  [98.5 F (36.9 C)-98.7 F (37.1 C)] 98.7 F (37.1 C) (03/09 1435) Pulse Rate:  [92-103] 103 (03/09 1535) Resp:  [18-23] 23 (03/09 1440) BP: (116-141)/(83-104) 141/104 (03/09 1535) SpO2:  [93 %-96 %] 95 % (03/09 1435) Weight:  [76 kg-77.4 kg] 77.4 kg (03/09 1430)  Physical Exam  Constitutional: no distress,  conversant HEENT: conj clear; normocephalic; oropharynx clear Cardiovascular: rrr 2/6 systolic murmur Pulmonary/Chest: Effort normal; cta Abdominal: Soft/nt Neurological: nonfocal Psych alert/oriented, mood apprpriate Skin no rash; left ue AVG site good thrill without swelling/fluctuance/tenderness    Lab Results Recent Labs    10/11/20 1203 10/12/20 1226 10/13/20 0145  WBC 14.9*  --  12.1*  HGB 7.5* 9.2* 7.5*  HCT 22.8* 27.0* 24.1*  NA 129* 131* 127*  K 5.0 4.9 4.6  CL 90* 92* 88*  CO2 23  --  23  BUN 56* 34* 36*  CREATININE 11.02* 7.00* 8.15*   Liver Panel Recent Labs    10/11/20 1203 10/13/20 0145  PROT  --  8.1  ALBUMIN 2.1* 2.3*   AST  --  27  ALT  --  12  ALKPHOS  --  52  BILITOT  --  1.0    Microbiology: 2/24 blood cx NGTD 2/21 blood cx MRSA   Studies/Results: 2/22 brain mri 1. 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 Multiple vascular territories are involved and findings are highly suspicious for an embolic process. 2. 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 A few scattered supratentorial chronic microhemorrhages are also present. 3. Mild generalized atrophy of the brain  2/22 tte 1. Left ventricular ejection fraction, by estimation, is 55 to 60%. The  left ventricle has normal function. The left ventricle has no regional  wall motion abnormalities. There is moderate concentric left ventricular  hypertrophy. Left ventricular  diastolic parameters are consistent with Grade II diastolic dysfunction  (pseudonormalization).  2. Right ventricular systolic function is normal. The right ventricular  size is normal. There is normal pulmonary artery systolic pressure.  3. Left atrial size was moderately dilated.  4. Right atrial size was mild to moderately dilated.  5. The mitral valve is normal in structure. Mild to moderate mitral valve  regurgitation. No evidence of mitral stenosis. Moderate to severe mitral  annular calcification.  6. The aortic valve is tricuspid. Aortic valve regurgitation is trivial.  No aortic stenosis is present.  7. The inferior vena cava is normal in size with greater than 50%  respiratory variability, suggesting right atrial pressure of 3 mmHg.  8. Agitated saline contrast bubble study was negative, with no evidence  of any interatrial shunt.  2/23 chest abd pelv ct 1. Cavitary mass in the left upper lobe apex measuring up to 2.2 cm with surrounding ground-glass density. Findings could be secondary to  cavitary infection or potential septic embolus. A cavitary neoplasm is also in the differential. There are additional small foci of consolidation and ground-glass density in the right upper lobe and small ground-glass nodule in left upper lobe, potentially infectious or inflammatory. Short interval chest CT follow-up is recommended. 2. Polycystic kidney disease. 3. No CT evidence for acute intra-abdominal or pelvic abnormality. 4. Bulky calcified mass anterior to right shoulder with smaller left paraspinal soft tissue calcification probably due to calcinosis related to chronic kidney disease  2/24 mr lumbar thoracic 1. No thoracolumbar discitis-osteomyelitis or epidural collection. 2. Mild bone marrow edema and disc space edema at C6-C7, likely degenerative. 3. Mild L5-S1 degenerative disc disease without spinal canal or neural foraminal stenosis  Assessment/Plan: Abx: 2/21-c vanc  50yo M with ESRD dialyzed via lue gore-tex avg (placed 10/2019), hep c (no prior tx) admitted 2/21 with 10 day malaise, fever, found to have sepsis due to MRSA bacteremia c/b  embolic stroke presumably from endocarditis  Complications: CNS septic emboli, pulmonary cavitary lesions/septic emboli. Has lower back pain but negative mri lumbar thoracic  Doesn't appear AVG clinically involved with mrsa bsi at this time. But if relapse after tx, should investigate further  2/21 & 2/24 bcx mrsa 2/27 bcx negative  Chronic hep c -- will f/u outpatient for treatment; genotype ordered here  ----- 3/09 assessment 3/01 tee showed LAA thrombus and 1.5 cm MV vegetation without significant MR. Overall improving mentation/clinical status. A surgical candidate in setting cns emboli and large mv vegetation. Await dental clearance for poor dentition prior to valve replacement S/p multiple teeth extraction 3/08     -continue vancomycin -duration vancomycin 6 weeks from 2/27 to 11/14/2020; to receive with dialysis; no  need for opat -if during valve replacement and surgery finding concerning (purulence and culture positive) would do 6 weeks abx as dr Tommy Medal suggested. But if no abscess/purulence and culture of valve tissue negative, potentially could finish original 6 weeks duration above suggested but at least 2 weeks coverage post procedure (whichever is longer) -please send valve tissue for culture  -weekly cbc, cmp, crp  -id clinic f/u as below Fax weekly labs to [phone]  Clinic Follow Up Appt: 3/24 @ 330 with dr Gale Journey  @  [facility] [place], [place], [place] [address] Phone: [phone]     [facility]  I spent more than 30 minute reviewing data/chart, and coordinating care and >50% direct face to face time providing counseling/discussing diagnostics/treatment plan with patient   10/13/2020, 5:49 PM

## 2020-10-13 NOTE — Progress Notes (Signed)
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 Code stroke was called on hospital day 1 and imaging showed embolic strokes. His blood cultures were positive for staph aureus and he's had TEE on 3/1 which is concerning for mitral valve endocarditis. Currently on vancomycin. ID, neurology, cardiology, and CT surgery are following. He underwent excision of teeth , scheduled to have mitral valve repair tomorrow.  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  Surveillance blood cultures obtained on 2/27 after initiation of antibiotics remain negative.  CT scan of the abdomen and pelvis on 2/23 with cavitary mass in the left upper lobe, possibly septic embolus, as well  as small foci of consolidation and groundglass density in the right upper lobe and small groundglass nodule in the left upper lobe (infectious versus inflammatory), CT also with polycystic kidney disease.  -Cardiothoracic surgery consulted, he is a surgical candidate, underwent cardiac cath 3/3 with clean coronaries.  Cardiothoracic surgery plans valve repair on 3/10.  He underwent removal of decayed teeth extraction on 3/8  New onset aphasia, scattered small acute infarcts in the bilateral cerebral hemispheres, right basal ganglia, callosal splenium, bilateral cerebral hemispheres concerning for embolic process -likely due to endocarditis.  2D echo showed EF 55-60%, grade 2 DD.  An MRI of the head and neck without appreciable stenosis.  Acute metabolic encephalopathy-improving  Seizure-like activity upper extremities-myoclonus suspected, hold gabapentin.  EEG negative.  ESRD due to adult polycystic kidney disease-Continue HD a schedule.  Iron deficiency anemia, anemia of chronic disease-monitor hemoglobin, slightly lower today at 7.5, no bleeding   Hyponatremia-fluids managed with HD.  Essential hypertension-continue Home BP medications.  Liver cirrhosis, history of hep C-monitor LFTs   DVT prophylaxis: SCDs Code Status: Full code. Family Communication: No family at bed side. Disposition Plan:   Status is: Inpatient  Remains inpatient appropriate because:Inpatient level of care appropriate due to severity of illness   Dispo: The patient is from: Home              Anticipated d/c is to: Home              Patient currently is not medically stable to d/c.   Difficult to place patient No    Consultants:   Nephrology  Dentistry  Cardiothoracic surgery  Neurology  Cardiology  Procedures: 2D echo, TEE, left heart cath. Antimicrobials:   Anti-infectives (From admission, onward)   Start     Dose/Rate Route Frequency Ordered Stop   10/14/20 0400  vancomycin  (VANCOREADY) IVPB 1250 mg/250 mL        1,250 mg 166.7 mL/hr over 90 Minutes Intravenous To Surgery 10/13/20 1039 10/15/20 0400   10/14/20 0400  cefUROXime (ZINACEF) 1.5 g in sodium chloride 0.9 % 100 mL IVPB        1.5 g 200 mL/hr over 30 Minutes Intravenous To Surgery 10/13/20 1039 10/15/20 0400   10/14/20 0400  cefUROXime (ZINACEF) 750 mg in sodium chloride 0.9 % 100 mL IVPB        750 mg 200 mL/hr over 30 Minutes Intravenous To Surgery 10/13/20 1039 10/15/20 0400   10/14/20 0400  vancomycin (VANCOCIN) 1,000 mg in sodium chloride 0.9 % 1,000 mL irrigation         Irrigation To Surgery 10/13/20 1039 10/15/20 0400   10/12/20 0600  ceFAZolin (ANCEF) IVPB 2g/100 mL premix        2 g 200 mL/hr over 30 Minutes Intravenous On call to O.R. 10/11/20 1841 10/12/20 1315   10/11/20 1415  vancomycin (VANCOCIN) IVPB 500 mg/100 ml premix        500 mg 100 mL/hr over 60 Minutes Intravenous Every M-W-F (Hemodialysis) 10/11/20 1413     10/11/20 1413  vancomycin (VANCOCIN) 500-5 MG/100ML-% IVPB       Note to Pharmacy: Kalman Shan   : cabinet override      10/11/20 1413 10/12/20 0229   10/11/20 1353  Vancomycin (VANCOCIN) 750-5 MG/150ML-% IVPB  Status:  Discontinued       Note to Pharmacy: Kalman Shan   : cabinet override      10/11/20 1353 10/11/20 1417   10/08/20 1200  vancomycin (VANCOCIN) IVPB 750 mg/150 ml premix  Status:  Discontinued        750 mg 150 mL/hr over 60 Minutes Intravenous Every M-W-F (Hemodialysis) 10/05/20 1404 10/11/20 1413   10/06/20 1619  vancomycin (VANCOCIN) 500-5 MG/100ML-% IVPB       Note to Pharmacy: Judieth Keens  : cabinet override      10/06/20 1619 10/06/20 1736   10/06/20 1200  vancomycin (VANCOCIN) IVPB 750 mg/150 ml premix  Status:  Discontinued        750 mg 150 mL/hr over 60 Minutes Intravenous Every M-W-F (Hemodialysis) 10/04/20 0905 10/05/20 1404   10/06/20 1200  vancomycin (VANCOCIN) IVPB 500 mg/100 ml premix        500 mg 100 mL/hr over 60 Minutes  Intravenous Every Wed (Hemodialysis) 10/05/20 1404 10/06/20 2000   10/01/20 2000  DAPTOmycin (CUBICIN) 840 mg in sodium chloride 0.9 % IVPB  Status:  Discontinued        840 mg 233.6 mL/hr over 30 Minutes Intravenous Every 48 hours 10/01/20 0922 10/01/20 1056   10/01/20 1200  vancomycin (VANCOCIN) IVPB 1000 mg/200 mL premix  Status:  Discontinued        1,000 mg 200 mL/hr over 60 Minutes Intravenous Every M-W-F (Hemodialysis) 10/01/20 1056 10/04/20 0845   09/29/20 1200  vancomycin (VANCOCIN) IVPB 1000 mg/200 mL premix  Status:  Discontinued        1,000 mg 200 mL/hr over 60 Minutes Intravenous Every M-W-F (Hemodialysis) 09/27/20 1443 10/01/20 0922   09/29/20 1200  ceFEPIme (MAXIPIME) 2 g in sodium chloride 0.9 % 100 mL IVPB  Status:  Discontinued  2 g 200 mL/hr over 30 Minutes Intravenous Every M-W-F (Hemodialysis) 09/27/20 1443 09/28/20 1054   09/28/20 0900  metroNIDAZOLE (FLAGYL) IVPB 500 mg  Status:  Discontinued        500 mg 100 mL/hr over 60 Minutes Intravenous Every 12 hours 09/27/20 1808 09/28/20 1054   09/28/20 0900  ceFEPIme (MAXIPIME) 1 g in sodium chloride 0.9 % 100 mL IVPB  Status:  Discontinued        1 g 200 mL/hr over 30 Minutes Intravenous Every 12 hours 09/27/20 1808 09/27/20 1812   09/27/20 1445  vancomycin (VANCOREADY) IVPB 2000 mg/400 mL        2,000 mg 200 mL/hr over 120 Minutes Intravenous  Once 09/27/20 1433 09/27/20 2330   09/27/20 1430  ceFEPIme (MAXIPIME) 2 g in sodium chloride 0.9 % 100 mL IVPB        2 g 200 mL/hr over 30 Minutes Intravenous  Once 09/27/20 1418 09/27/20 1541   09/27/20 1430  metroNIDAZOLE (FLAGYL) IVPB 500 mg        500 mg 100 mL/hr over 60 Minutes Intravenous  Once 09/27/20 1418 09/27/20 2214   09/27/20 1430  vancomycin (VANCOREADY) IVPB 1750 mg/350 mL  Status:  Discontinued        1,750 mg 175 mL/hr over 120 Minutes Intravenous  Once 09/27/20 1418 09/27/20 1433      Subjective: Patient was seen and examined at bedside overnight  events noted.   Patient reports feeling better.  Patient denies any chest pain or symptoms.  Objective: Vitals:   10/13/20 1435 10/13/20 1440 10/13/20 1500 10/13/20 1535  BP: (!) 131/95 (!) 133/94 (!) 127/98 (!) 141/104  Pulse: 99  (!) 103 (!) 103  Resp: (!) 23 (!) 23    Temp: 98.7 F (37.1 C)     TempSrc: Oral     SpO2: 95%     Weight:      Height:        Intake/Output Summary (Last 24 hours) at 10/13/2020 1545 Last data filed at 10/12/2020 1800 Gross per 24 hour  Intake 240 ml  Output -  Net 240 ml   Filed Weights   10/12/20 0444 10/13/20 0608 10/13/20 1430  Weight: 75.3 kg 76 kg 77.4 kg    Examination:  General exam: Appears calm and comfortable, not in any acute distress. Respiratory system: Clear to auscultation. Respiratory effort normal. Cardiovascular system: S1 & S2 heard, RRR. No JVD, murmurs, rubs, gallops or clicks. No pedal edema. Gastrointestinal system: Abdomen is nondistended, soft and nontender. No organomegaly or masses felt.  Normal bowel sounds heard. Central nervous system: Alert and oriented. No focal neurological deficits. Extremities: Symmetric 5 x 5 power. Skin: No rashes, lesions or ulcers Psychiatry: Judgement and insight appear normal. Mood & affect appropriate.     Data Reviewed: I have personally reviewed following labs and imaging studies  CBC: Recent Labs  Lab 10/07/20 0401 10/07/20 0952 10/07/20 0956 10/08/20 0335 10/11/20 1203 10/12/20 1226 10/13/20 0145  WBC 19.1*  --   --  19.2* 14.9*  --  12.1*  HGB 8.4*   < > 9.2* 9.3* 7.5* 9.2* 7.5*  HCT 25.4*   < > 27.0* 29.7* 22.8* 27.0* 24.1*  MCV 83.6  --   --  85.6 84.4  --  86.1  PLT 270  --   --  295 322  --  251   < > = values in this interval not displayed.   Basic Metabolic Panel: Recent Labs  Lab 10/07/20 0401 10/07/20 0952 10/07/20 0956 10/08/20 0335 10/11/20 1203 10/12/20 1226 10/13/20 0145  NA 133*   < > 134* 132* 129* 131* 127*  K 4.2   < > 4.3 4.6 5.0 4.9 4.6   CL 94*  --   --  93* 90* 92* 88*  CO2 23  --   --  25 23  --  23  GLUCOSE 102*  --   --  114* 109* 93 89  BUN 34*  --   --  43* 56* 34* 36*  CREATININE 6.39*  --   --  8.59* 11.02* 7.00* 8.15*  CALCIUM 7.9*  --   --  8.5* 8.1*  --  8.4*  PHOS  --   --   --   --  7.8*  --   --    < > = values in this interval not displayed.   GFR: Estimated Creatinine Clearance: 12 mL/min (A) (by C-G formula based on SCr of 8.15 mg/dL (H)). Liver Function Tests: Recent Labs  Lab 10/07/20 0401 10/11/20 1203 10/13/20 0145  AST 33  --  27  ALT 16  --  12  ALKPHOS 57  --  52  BILITOT 0.8  --  1.0  PROT 7.8  --  8.1  ALBUMIN 2.2* 2.1* 2.3*   No results for input(s): LIPASE, AMYLASE in the last 168 hours. No results for input(s): AMMONIA in the last 168 hours. Coagulation Profile: Recent Labs  Lab 10/13/20 0145  INR 1.2   Cardiac Enzymes: No results for input(s): CKTOTAL, CKMB, CKMBINDEX, TROPONINI in the last 168 hours. BNP (last 3 results) No results for input(s): PROBNP in the last 8760 hours. HbA1C: No results for input(s): HGBA1C in the last 72 hours. CBG: No results for input(s): GLUCAP in the last 168 hours. Lipid Profile: No results for input(s): CHOL, HDL, LDLCALC, TRIG, CHOLHDL, LDLDIRECT in the last 72 hours. Thyroid Function Tests: No results for input(s): TSH, T4TOTAL, FREET4, T3FREE, THYROIDAB in the last 72 hours. Anemia Panel: No results for input(s): VITAMINB12, FOLATE, FERRITIN, TIBC, IRON, RETICCTPCT in the last 72 hours. Sepsis Labs: No results for input(s): PROCALCITON, LATICACIDVEN in the last 168 hours.  Recent Results (from the past 240 hour(s))  Surgical pcr screen     Status: Abnormal   Collection Time: 10/11/20  6:44 PM   Specimen: Nasal Mucosa; Nasal Swab  Result Value Ref Range Status   MRSA, PCR POSITIVE (A) NEGATIVE Final    Comment: RESULT CALLED TO, READ BACK BY AND VERIFIED WITH: F COMBRES RN 10/11/20 2339 JDW    Staphylococcus aureus POSITIVE (A)  NEGATIVE Final    Comment: (NOTE) The Xpert SA Assay (FDA approved for NASAL specimens in patients 68 years of age and older), is one component of a comprehensive surveillance program. It is not intended to diagnose infection nor to guide or monitor treatment. Performed at [facility], [place] [address], [place], [place] [address]     Radiology Studies: DG Chest 2 View  Result Date: 10/13/2020 CLINICAL DATA:  Congestive heart failure, malaise EXAM: CHEST - 2 VIEW COMPARISON:  10/06/2020 FINDINGS: Pulmonary insufflation is normal and symmetric. Left upper lobe peripheral cavitary nodule is grossly unchanged from prior CT examination,. No pneumothorax or pleural effusion. Cardiac size is mildly enlarged. Pulmonary vascularity is normal. Vascular stent seen within the expected left subclavian vein. 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 IMPRESSION: No radiographic evidence of acute cardiopulmonary disease.  Stable cavitary nodule within the left upper lobe, indeterminate. Tumoral calcinosis surrounding the right shoulder. Electronically Signed   By: Fidela Salisbury MD   On: 10/13/2020 06:52   Scheduled Meds: . (feeding supplement) PROSource Plus  30 mL Oral BID BM  . [START ON 10/14/2020] amLODipine  5 mg Oral QHS  . calcium acetate  1,334 mg Oral TID WC  . calcium acetate  667 mg Oral With snacks  . Chlorhexidine Gluconate Cloth  6 each Topical Q0600  . Chlorhexidine Gluconate Cloth  6 each Topical Q0600  . Chlorhexidine Gluconate Cloth  6 each Topical Q0600  . cinacalcet  30 mg Oral Q breakfast  . darbepoetin (ARANESP) injection - DIALYSIS  100 mcg Intravenous Q Wed-HD  . doxercalciferol  3 mcg Intravenous Q M,W,F-HD  . [START ON 10/14/2020] epinephrine  0-10 mcg/min Intravenous To OR  . feeding supplement (NEPRO CARB STEADY)  237 mL Oral BID BM  . [START ON 10/14/2020] glutaraldehyde   Topical On Call to OR  . heparin  2,500 Units  Dialysis Once in dialysis  . [START ON 10/14/2020] heparin-papaverine-plasmalyte irrigation   Irrigation To OR  . hydrALAZINE  25 mg Oral BID  . influenza vac split quadrivalent PF  0.5 mL Intramuscular Tomorrow-1000  . [START ON 10/14/2020] insulin   Intravenous To OR  . [place] Blood Cardioplegia vial (lidocaine/magnesium/mannitol 0.26g-4g-6.4g)   Intracoronary Once  . metoprolol tartrate  75 mg Oral BID  . multivitamin  1 tablet Oral QHS  . mupirocin ointment  1 application Nasal BID  . [START ON 10/14/2020] phenylephrine  30-200 mcg/min Intravenous To OR  . [START ON 10/14/2020] potassium chloride  80 mEq Other To OR  . sodium chloride flush  3 mL Intravenous Q12H  . [START ON 10/14/2020] tranexamic acid  15 mg/kg Intravenous To OR  . [START ON 10/14/2020] tranexamic acid  2 mg/kg Intracatheter To OR  . [START ON 10/14/2020] vancomycin 1000 mg in NS (1000 ml) irrigation for Dr. Roxy Manns case   Irrigation To OR  . vancomycin  500 mg Intravenous Q M,W,F-HD   Continuous Infusions: . sodium chloride    . sodium chloride    . sodium chloride    . [START ON 10/14/2020] cefUROXime (ZINACEF)  IV    . [START ON 10/14/2020] cefUROXime (ZINACEF)  IV    . [START ON 10/14/2020] dexmedetomidine    . [START ON 10/14/2020] heparin 30,000 units/NS 1000 mL solution for CELLSAVER    . lactated ringers Stopped (10/12/20 1627)  . [START ON 10/14/2020] milrinone    . [START ON 10/14/2020] nitroGLYCERIN    . [START ON 10/14/2020] norepinephrine    . [START ON 10/14/2020] tranexamic acid (CYKLOKAPRON) infusion ([facility])    . [START ON 10/14/2020] vancomycin       LOS: 16 days    Time spent: 35 mins    Alejos Reinhardt, MD [facility]   If 7PM-7AM, please contact night-coverage

## 2020-10-13 NOTE — Anesthesia Preprocedure Evaluation (Addendum)
Anesthesia Evaluation  Patient identified by MRN, date of birth, ID band Patient awake    Reviewed: Allergy & Precautions, H&P , NPO status , Patient's Chart, lab work & pertinent test results, reviewed documented beta blocker date and time   Airway Mallampati: III  TM Distance: >3 FB Neck ROM: Full    Dental  (+) Dental Advisory Given, Missing   Pulmonary sleep apnea , former smoker,    Pulmonary exam normal breath sounds clear to auscultation       Cardiovascular hypertension, Pt. on medications and Pt. on home beta blockers +CHF  + Valvular Problems/Murmurs MR  Rhythm:Regular Rate:Normal + Systolic murmurs    Neuro/Psych CVA, No Residual Symptoms negative psych ROS   GI/Hepatic negative GI ROS, (+) Hepatitis -  Endo/Other  negative endocrine ROS  Renal/GU ESRF and DialysisRenal disease  negative genitourinary   Musculoskeletal   Abdominal   Peds  Hematology  (+) Blood dyscrasia, anemia ,   Anesthesia Other Findings   Reproductive/Obstetrics negative OB ROS                           Anesthesia Physical Anesthesia Plan  ASA: IV  Anesthesia Plan: General   Post-op Pain Management:    Induction: Intravenous  PONV Risk Score and Plan: 3 and Ondansetron, Dexamethasone, Midazolam and Treatment may vary due to age or medical condition  Airway Management Planned: Double Lumen EBT  Additional Equipment: Arterial line, CVP, PA Cath, TEE, 3D TEE and Ultrasound Guidance Line Placement  Intra-op Plan:   Post-operative Plan: Extubation in OR and Possible Post-op intubation/ventilation  Informed Consent: I have reviewed the patients History and Physical, chart, labs and discussed the procedure including the risks, benefits and alternatives for the proposed anesthesia with the patient or authorized representative who has indicated his/her understanding and acceptance.     Dental advisory  given  Plan Discussed with: Anesthesiologist and CRNA  Anesthesia Plan Comments:        Anesthesia Quick Evaluation

## 2020-10-14 ENCOUNTER — Inpatient Hospital Stay (HOSPITAL_COMMUNITY): Payer: Medicaid Other

## 2020-10-14 ENCOUNTER — Encounter (HOSPITAL_COMMUNITY)
Admission: EM | Disposition: A | Discharge status: Skilled Nursing Facility | Payer: Self-pay | Source: Home / Self Care | Attending: Internal Medicine

## 2020-10-14 ENCOUNTER — Inpatient Hospital Stay (HOSPITAL_COMMUNITY): Payer: Medicaid Other | Admitting: Certified Registered Nurse Anesthetist

## 2020-10-14 ENCOUNTER — Encounter (HOSPITAL_COMMUNITY): Payer: Self-pay | Admitting: Internal Medicine

## 2020-10-14 DIAGNOSIS — I083 Combined rheumatic disorders of mitral, aortic and tricuspid valves: Secondary | ICD-10-CM | POA: Diagnosis not present

## 2020-10-14 DIAGNOSIS — R531 Weakness: Secondary | ICD-10-CM | POA: Diagnosis not present

## 2020-10-14 DIAGNOSIS — I330 Acute and subacute infective endocarditis: Secondary | ICD-10-CM | POA: Diagnosis not present

## 2020-10-14 DIAGNOSIS — I5042 Chronic combined systolic (congestive) and diastolic (congestive) heart failure: Secondary | ICD-10-CM | POA: Diagnosis not present

## 2020-10-14 DIAGNOSIS — A4102 Sepsis due to Methicillin resistant Staphylococcus aureus: Secondary | ICD-10-CM | POA: Diagnosis not present

## 2020-10-14 DIAGNOSIS — I059 Rheumatic mitral valve disease, unspecified: Secondary | ICD-10-CM | POA: Diagnosis not present

## 2020-10-14 DIAGNOSIS — I517 Cardiomegaly: Secondary | ICD-10-CM | POA: Diagnosis not present

## 2020-10-14 DIAGNOSIS — N186 End stage renal disease: Secondary | ICD-10-CM | POA: Diagnosis not present

## 2020-10-14 DIAGNOSIS — I132 Hypertensive heart and chronic kidney disease with heart failure and with stage 5 chronic kidney disease, or end stage renal disease: Secondary | ICD-10-CM | POA: Diagnosis not present

## 2020-10-14 DIAGNOSIS — G928 Other toxic encephalopathy: Secondary | ICD-10-CM | POA: Diagnosis not present

## 2020-10-14 DIAGNOSIS — Z992 Dependence on renal dialysis: Secondary | ICD-10-CM | POA: Diagnosis not present

## 2020-10-14 DIAGNOSIS — D631 Anemia in chronic kidney disease: Secondary | ICD-10-CM | POA: Diagnosis not present

## 2020-10-14 DIAGNOSIS — I2690 Septic pulmonary embolism without acute cor pulmonale: Secondary | ICD-10-CM | POA: Diagnosis not present

## 2020-10-14 DIAGNOSIS — Z20822 Contact with and (suspected) exposure to covid-19: Secondary | ICD-10-CM | POA: Diagnosis not present

## 2020-10-14 DIAGNOSIS — R29711 NIHSS score 11: Secondary | ICD-10-CM | POA: Diagnosis not present

## 2020-10-14 DIAGNOSIS — R29712 NIHSS score 12: Secondary | ICD-10-CM | POA: Diagnosis not present

## 2020-10-14 DIAGNOSIS — J90 Pleural effusion, not elsewhere classified: Secondary | ICD-10-CM | POA: Diagnosis not present

## 2020-10-14 DIAGNOSIS — I5032 Chronic diastolic (congestive) heart failure: Secondary | ICD-10-CM | POA: Diagnosis not present

## 2020-10-14 DIAGNOSIS — R0902 Hypoxemia: Secondary | ICD-10-CM | POA: Diagnosis not present

## 2020-10-14 DIAGNOSIS — J9811 Atelectasis: Secondary | ICD-10-CM | POA: Diagnosis not present

## 2020-10-14 DIAGNOSIS — R29716 NIHSS score 16: Secondary | ICD-10-CM | POA: Diagnosis not present

## 2020-10-14 DIAGNOSIS — I6340 Cerebral infarction due to embolism of unspecified cerebral artery: Secondary | ICD-10-CM | POA: Diagnosis not present

## 2020-10-14 DIAGNOSIS — I639 Cerebral infarction, unspecified: Secondary | ICD-10-CM | POA: Diagnosis not present

## 2020-10-14 DIAGNOSIS — N2581 Secondary hyperparathyroidism of renal origin: Secondary | ICD-10-CM | POA: Diagnosis not present

## 2020-10-14 DIAGNOSIS — R0602 Shortness of breath: Secondary | ICD-10-CM | POA: Diagnosis not present

## 2020-10-14 DIAGNOSIS — R29718 NIHSS score 18: Secondary | ICD-10-CM | POA: Diagnosis not present

## 2020-10-14 DIAGNOSIS — Z953 Presence of xenogenic heart valve: Secondary | ICD-10-CM

## 2020-10-14 DIAGNOSIS — Q211 Atrial septal defect: Secondary | ICD-10-CM | POA: Diagnosis not present

## 2020-10-14 DIAGNOSIS — J189 Pneumonia, unspecified organism: Secondary | ICD-10-CM | POA: Diagnosis not present

## 2020-10-14 DIAGNOSIS — A419 Sepsis, unspecified organism: Secondary | ICD-10-CM | POA: Diagnosis not present

## 2020-10-14 DIAGNOSIS — I341 Nonrheumatic mitral (valve) prolapse: Secondary | ICD-10-CM | POA: Diagnosis not present

## 2020-10-14 HISTORY — PX: TEE WITHOUT CARDIOVERSION: SHX5443

## 2020-10-14 HISTORY — DX: Presence of xenogenic heart valve: Z95.3

## 2020-10-14 HISTORY — PX: MITRAL VALVE REPAIR: SHX2039

## 2020-10-14 HISTORY — PX: REPAIR OF PATENT FORAMEN OVALE: SHX6064

## 2020-10-14 LAB — POCT I-STAT 7, (LYTES, BLD GAS, ICA,H+H)
Acid-Base Excess: 0 mmol/L (ref 0.0–2.0)
Acid-Base Excess: 0 mmol/L (ref 0.0–2.0)
Acid-Base Excess: 1 mmol/L (ref 0.0–2.0)
Acid-base deficit: 1 mmol/L (ref 0.0–2.0)
Acid-base deficit: 6 mmol/L — ABNORMAL HIGH (ref 0.0–2.0)
Acid-base deficit: 4 mmol/L — ABNORMAL HIGH (ref 0.0–2.0)
Bicarbonate: 24.2 mmol/L (ref 20.0–28.0)
Bicarbonate: 24.6 mmol/L (ref 20.0–28.0)
Bicarbonate: 27.6 mmol/L (ref 20.0–28.0)
Bicarbonate: 24.8 mmol/L (ref 20.0–28.0)
Bicarbonate: 20.3 mmol/L (ref 20.0–28.0)
Bicarbonate: 19.4 mmol/L — ABNORMAL LOW (ref 20.0–28.0)
Calcium, Ion: 0.84 mmol/L — CL (ref 1.15–1.40)
Calcium, Ion: 0.92 mmol/L — ABNORMAL LOW (ref 1.15–1.40)
Calcium, Ion: 1.01 mmol/L — ABNORMAL LOW (ref 1.15–1.40)
Calcium, Ion: 0.98 mmol/L — ABNORMAL LOW (ref 1.15–1.40)
Calcium, Ion: 0.91 mmol/L — ABNORMAL LOW (ref 1.15–1.40)
Calcium, Ion: 0.98 mmol/L — ABNORMAL LOW (ref 1.15–1.40)
HCT: 24 % — ABNORMAL LOW (ref 39.0–52.0)
HCT: 26 % — ABNORMAL LOW (ref 39.0–52.0)
HCT: 23 % — ABNORMAL LOW (ref 39.0–52.0)
HCT: 22 % — ABNORMAL LOW (ref 39.0–52.0)
HCT: 27 % — ABNORMAL LOW (ref 39.0–52.0)
HCT: 29 % — ABNORMAL LOW (ref 39.0–52.0)
Hemoglobin: 8.2 g/dL — ABNORMAL LOW (ref 13.0–17.0)
Hemoglobin: 8.8 g/dL — ABNORMAL LOW (ref 13.0–17.0)
Hemoglobin: 7.8 g/dL — ABNORMAL LOW (ref 13.0–17.0)
Hemoglobin: 7.5 g/dL — ABNORMAL LOW (ref 13.0–17.0)
Hemoglobin: 9.2 g/dL — ABNORMAL LOW (ref 13.0–17.0)
Hemoglobin: 9.9 g/dL — ABNORMAL LOW (ref 13.0–17.0)
O2 Saturation: 100 %
O2 Saturation: 74 %
O2 Saturation: 100 %
O2 Saturation: 100 %
O2 Saturation: 98 %
O2 Saturation: 95 %
Patient temperature: 35.8
Potassium: 4.7 mmol/L (ref 3.5–5.1)
Potassium: 4.8 mmol/L (ref 3.5–5.1)
Potassium: 5.6 mmol/L — ABNORMAL HIGH (ref 3.5–5.1)
Potassium: 6.3 mmol/L (ref 3.5–5.1)
Potassium: 5.8 mmol/L — ABNORMAL HIGH (ref 3.5–5.1)
Potassium: 5.5 mmol/L — ABNORMAL HIGH (ref 3.5–5.1)
Sodium: 136 mmol/L (ref 135–145)
Sodium: 137 mmol/L (ref 135–145)
Sodium: 135 mmol/L (ref 135–145)
Sodium: 134 mmol/L — ABNORMAL LOW (ref 135–145)
Sodium: 137 mmol/L (ref 135–145)
Sodium: 137 mmol/L (ref 135–145)
TCO2: 26 mmol/L (ref 22–32)
TCO2: 26 mmol/L (ref 22–32)
TCO2: 29 mmol/L (ref 22–32)
TCO2: 26 mmol/L (ref 22–32)
TCO2: 22 mmol/L (ref 22–32)
TCO2: 20 mmol/L — ABNORMAL LOW (ref 22–32)
pCO2 arterial: 37.1 mmHg (ref 32.0–48.0)
pCO2 arterial: 42.4 mmHg (ref 32.0–48.0)
pCO2 arterial: 61.6 mmHg — ABNORMAL HIGH (ref 32.0–48.0)
pCO2 arterial: 34.6 mmHg (ref 32.0–48.0)
pCO2 arterial: 41.8 mmHg (ref 32.0–48.0)
pCO2 arterial: 29 mmHg — ABNORMAL LOW (ref 32.0–48.0)
pH, Arterial: 7.366 (ref 7.350–7.450)
pH, Arterial: 7.431 (ref 7.350–7.450)
pH, Arterial: 7.26 — ABNORMAL LOW (ref 7.350–7.450)
pH, Arterial: 7.464 — ABNORMAL HIGH (ref 7.350–7.450)
pH, Arterial: 7.295 — ABNORMAL LOW (ref 7.350–7.450)
pH, Arterial: 7.429 (ref 7.350–7.450)
pO2, Arterial: 41 mmHg — ABNORMAL LOW (ref 83.0–108.0)
pO2, Arterial: 420 mmHg — ABNORMAL HIGH (ref 83.0–108.0)
pO2, Arterial: 401 mmHg — ABNORMAL HIGH (ref 83.0–108.0)
pO2, Arterial: 419 mmHg — ABNORMAL HIGH (ref 83.0–108.0)
pO2, Arterial: 126 mmHg — ABNORMAL HIGH (ref 83.0–108.0)
pO2, Arterial: 67 mmHg — ABNORMAL LOW (ref 83.0–108.0)

## 2020-10-14 LAB — BASIC METABOLIC PANEL
Anion gap: 12 (ref 5–15)
BUN: 26 mg/dL — ABNORMAL HIGH (ref 6–20)
CO2: 19 mmol/L — ABNORMAL LOW (ref 22–32)
Calcium: 7.7 mg/dL — ABNORMAL LOW (ref 8.9–10.3)
Chloride: 104 mmol/L (ref 98–111)
Creatinine, Ser: 5.45 mg/dL — ABNORMAL HIGH (ref 0.61–1.24)
GFR, Estimated: 12 mL/min — ABNORMAL LOW (ref >60)
Glucose, Bld: 95 mg/dL (ref 70–99)
Potassium: 5.7 mmol/L — ABNORMAL HIGH (ref 3.5–5.1)
Sodium: 135 mmol/L (ref 135–145)

## 2020-10-14 LAB — POCT I-STAT, CHEM 8
BUN: 23 mg/dL — ABNORMAL HIGH (ref 6–20)
BUN: 22 mg/dL — ABNORMAL HIGH (ref 6–20)
BUN: 22 mg/dL — ABNORMAL HIGH (ref 6–20)
BUN: 22 mg/dL — ABNORMAL HIGH (ref 6–20)
BUN: 24 mg/dL — ABNORMAL HIGH (ref 6–20)
BUN: 22 mg/dL — ABNORMAL HIGH (ref 6–20)
Calcium, Ion: 1.13 mmol/L — ABNORMAL LOW (ref 1.15–1.40)
Calcium, Ion: 0.95 mmol/L — ABNORMAL LOW (ref 1.15–1.40)
Calcium, Ion: 1.09 mmol/L — ABNORMAL LOW (ref 1.15–1.40)
Calcium, Ion: 1 mmol/L — ABNORMAL LOW (ref 1.15–1.40)
Calcium, Ion: 1.01 mmol/L — ABNORMAL LOW (ref 1.15–1.40)
Calcium, Ion: 0.96 mmol/L — ABNORMAL LOW (ref 1.15–1.40)
Chloride: 97 mmol/L — ABNORMAL LOW (ref 98–111)
Chloride: 100 mmol/L (ref 98–111)
Chloride: 100 mmol/L (ref 98–111)
Chloride: 99 mmol/L (ref 98–111)
Chloride: 102 mmol/L (ref 98–111)
Chloride: 101 mmol/L (ref 98–111)
Creatinine, Ser: 5.6 mg/dL — ABNORMAL HIGH (ref 0.61–1.24)
Creatinine, Ser: 5.4 mg/dL — ABNORMAL HIGH (ref 0.61–1.24)
Creatinine, Ser: 5.6 mg/dL — ABNORMAL HIGH (ref 0.61–1.24)
Creatinine, Ser: 5.2 mg/dL — ABNORMAL HIGH (ref 0.61–1.24)
Creatinine, Ser: 5.3 mg/dL — ABNORMAL HIGH (ref 0.61–1.24)
Creatinine, Ser: 5.1 mg/dL — ABNORMAL HIGH (ref 0.61–1.24)
Glucose, Bld: 93 mg/dL (ref 70–99)
Glucose, Bld: 106 mg/dL — ABNORMAL HIGH (ref 70–99)
Glucose, Bld: 89 mg/dL (ref 70–99)
Glucose, Bld: 134 mg/dL — ABNORMAL HIGH (ref 70–99)
Glucose, Bld: 146 mg/dL — ABNORMAL HIGH (ref 70–99)
Glucose, Bld: 143 mg/dL — ABNORMAL HIGH (ref 70–99)
HCT: 24 % — ABNORMAL LOW (ref 39.0–52.0)
HCT: 22 % — ABNORMAL LOW (ref 39.0–52.0)
HCT: 22 % — ABNORMAL LOW (ref 39.0–52.0)
HCT: 24 % — ABNORMAL LOW (ref 39.0–52.0)
HCT: 23 % — ABNORMAL LOW (ref 39.0–52.0)
HCT: 26 % — ABNORMAL LOW (ref 39.0–52.0)
Hemoglobin: 8.2 g/dL — ABNORMAL LOW (ref 13.0–17.0)
Hemoglobin: 7.5 g/dL — ABNORMAL LOW (ref 13.0–17.0)
Hemoglobin: 7.5 g/dL — ABNORMAL LOW (ref 13.0–17.0)
Hemoglobin: 8.2 g/dL — ABNORMAL LOW (ref 13.0–17.0)
Hemoglobin: 7.8 g/dL — ABNORMAL LOW (ref 13.0–17.0)
Hemoglobin: 8.8 g/dL — ABNORMAL LOW (ref 13.0–17.0)
Potassium: 4.6 mmol/L (ref 3.5–5.1)
Potassium: 4.6 mmol/L (ref 3.5–5.1)
Potassium: 5.9 mmol/L — ABNORMAL HIGH (ref 3.5–5.1)
Potassium: 5.7 mmol/L — ABNORMAL HIGH (ref 3.5–5.1)
Potassium: 6.5 mmol/L (ref 3.5–5.1)
Potassium: 5.8 mmol/L — ABNORMAL HIGH (ref 3.5–5.1)
Sodium: 135 mmol/L (ref 135–145)
Sodium: 134 mmol/L — ABNORMAL LOW (ref 135–145)
Sodium: 134 mmol/L — ABNORMAL LOW (ref 135–145)
Sodium: 134 mmol/L — ABNORMAL LOW (ref 135–145)
Sodium: 133 mmol/L — ABNORMAL LOW (ref 135–145)
Sodium: 136 mmol/L (ref 135–145)
TCO2: 29 mmol/L (ref 22–32)
TCO2: 24 mmol/L (ref 22–32)
TCO2: 27 mmol/L (ref 22–32)
TCO2: 26 mmol/L (ref 22–32)
TCO2: 23 mmol/L (ref 22–32)
TCO2: 21 mmol/L — ABNORMAL LOW (ref 22–32)

## 2020-10-14 LAB — CBC
HCT: 25.1 % — ABNORMAL LOW (ref 39.0–52.0)
HCT: 29.3 % — ABNORMAL LOW (ref 39.0–52.0)
HCT: 28 % — ABNORMAL LOW (ref 39.0–52.0)
Hemoglobin: 7.9 g/dL — ABNORMAL LOW (ref 13.0–17.0)
Hemoglobin: 9.2 g/dL — ABNORMAL LOW (ref 13.0–17.0)
Hemoglobin: 9.2 g/dL — ABNORMAL LOW (ref 13.0–17.0)
MCH: 27 pg (ref 26.0–34.0)
MCH: 28 pg (ref 26.0–34.0)
MCH: 28.6 pg (ref 26.0–34.0)
MCHC: 31.5 g/dL (ref 30.0–36.0)
MCHC: 31.4 g/dL (ref 30.0–36.0)
MCHC: 32.9 g/dL (ref 30.0–36.0)
MCV: 85.7 fL (ref 80.0–100.0)
MCV: 89.1 fL (ref 80.0–100.0)
MCV: 87 fL (ref 80.0–100.0)
Platelets: 231 10*3/uL (ref 150–400)
Platelets: 121 10*3/uL — ABNORMAL LOW (ref 150–400)
Platelets: 122 10*3/uL — ABNORMAL LOW (ref 150–400)
RBC: 2.93 MIL/uL — ABNORMAL LOW (ref 4.22–5.81)
RBC: 3.29 MIL/uL — ABNORMAL LOW (ref 4.22–5.81)
RBC: 3.22 MIL/uL — ABNORMAL LOW (ref 4.22–5.81)
RDW: 16.2 % — ABNORMAL HIGH (ref 11.5–15.5)
RDW: 16 % — ABNORMAL HIGH (ref 11.5–15.5)
RDW: 16 % — ABNORMAL HIGH (ref 11.5–15.5)
WBC: 10.2 10*3/uL (ref 4.0–10.5)
WBC: 17.6 10*3/uL — ABNORMAL HIGH (ref 4.0–10.5)
WBC: 13.9 10*3/uL — ABNORMAL HIGH (ref 4.0–10.5)
nRBC: 0 % (ref 0.0–0.2)
nRBC: 0 % (ref 0.0–0.2)
nRBC: 0 % (ref 0.0–0.2)

## 2020-10-14 LAB — COMPREHENSIVE METABOLIC PANEL
ALT: 10 U/L (ref 0–44)
AST: 35 U/L (ref 15–41)
Albumin: 2.4 g/dL — ABNORMAL LOW (ref 3.5–5.0)
Alkaline Phosphatase: 46 U/L (ref 38–126)
Anion gap: 10 (ref 5–15)
BUN: 19 mg/dL (ref 6–20)
CO2: 28 mmol/L (ref 22–32)
Calcium: 9.1 mg/dL (ref 8.9–10.3)
Chloride: 94 mmol/L — ABNORMAL LOW (ref 98–111)
Creatinine, Ser: 5.6 mg/dL — ABNORMAL HIGH (ref 0.61–1.24)
GFR, Estimated: 12 mL/min — ABNORMAL LOW (ref >60)
Glucose, Bld: 87 mg/dL (ref 70–99)
Potassium: 4.6 mmol/L (ref 3.5–5.1)
Sodium: 132 mmol/L — ABNORMAL LOW (ref 135–145)
Total Bilirubin: 0.8 mg/dL (ref 0.3–1.2)
Total Protein: 8.7 g/dL — ABNORMAL HIGH (ref 6.5–8.1)

## 2020-10-14 LAB — MAGNESIUM
Magnesium: 2.1 mg/dL (ref 1.7–2.4)
Magnesium: 2.6 mg/dL — ABNORMAL HIGH (ref 1.7–2.4)

## 2020-10-14 LAB — PREPARE RBC (CROSSMATCH)

## 2020-10-14 LAB — HEMOGLOBIN AND HEMATOCRIT, BLOOD
HCT: 20.8 % — ABNORMAL LOW (ref 39.0–52.0)
Hemoglobin: 6.4 g/dL — CL (ref 13.0–17.0)

## 2020-10-14 LAB — ECHO INTRAOPERATIVE TEE
Height: 72 in
S' Lateral: 2.7 cm
Weight: 2598.4 oz

## 2020-10-14 LAB — PHOSPHORUS: Phosphorus: 5.8 mg/dL — ABNORMAL HIGH (ref 2.5–4.6)

## 2020-10-14 LAB — PROTIME-INR
INR: 1.5 — ABNORMAL HIGH (ref 0.8–1.2)
Prothrombin Time: 17.7 seconds — ABNORMAL HIGH (ref 11.4–15.2)

## 2020-10-14 LAB — FIBRINOGEN: Fibrinogen: 364 mg/dL (ref 210–475)

## 2020-10-14 LAB — PLATELET COUNT: Platelets: 165 10*3/uL (ref 150–400)

## 2020-10-14 LAB — APTT: aPTT: 43 seconds — ABNORMAL HIGH (ref 24–36)

## 2020-10-14 IMAGING — DX DG CHEST 1V PORT
1 series · 1 of 1 positions shown · non-contrast
Comparison: [DATE]

CLINICAL DATA: Hypoxia.  Status post mitral valve replacement

EXAM:
PORTABLE CHEST 1 VIEW

[chest ap]
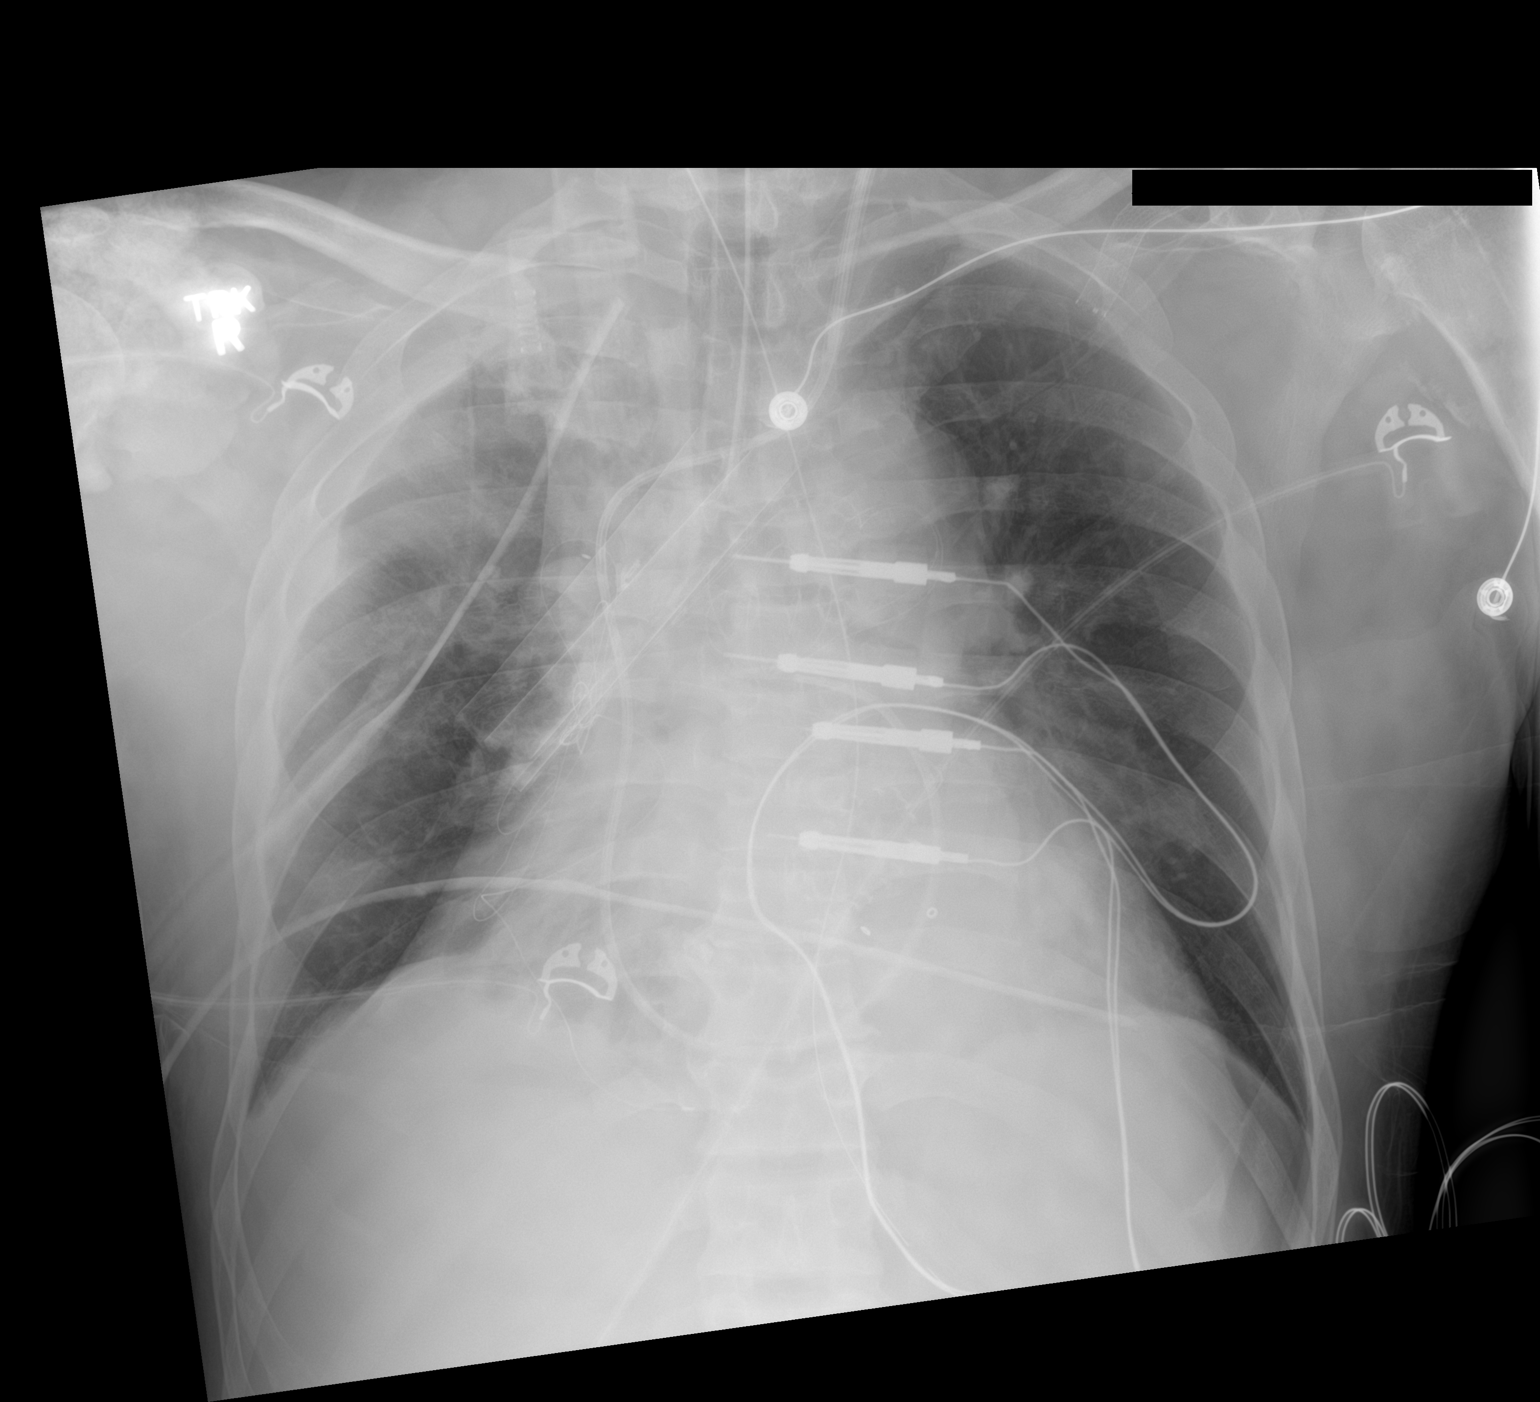

[1 of 1 positions shown; findings below may reference images not displayed]

FINDINGS: Endotracheal tube tip is 4.0 cm above the carina. Swan-Ganz catheter
tip is in the proximal right main pulmonary artery. Nasogastric tube
tip and side port are below the diaphragm. There is a right chest
tube. Temporary pacemaker wires are attached to the right heart. No
pneumothorax. There is mild atelectasis in the lung bases. Lungs
elsewhere are clear. There is cardiomegaly. Status post mitral valve
replacement. Stent noted in the left subclavian region. Suspected
tumoral calcinosis throughout the right shoulder region, stable.
IMPRESSION: Tube and catheter positions as described without evident
pneumothorax. Bibasilar atelectasis. Cardiomegaly. Status post
mitral valve replacement. Suspected tumoral calcinosis right
shoulder region.

## 2020-10-14 SURGERY — REPAIR, MITRAL VALVE, MINIMALLY INVASIVE
Anesthesia: General | Site: Chest | Laterality: Right

## 2020-10-14 MED ORDER — BISACODYL 10 MG RE SUPP: 10.0000 mg | Freq: Every day | RECTAL | Status: DC

## 2020-10-14 MED ORDER — INSULIN REGULAR(HUMAN) IN NACL 100-0.9 UT/100ML-% IV SOLN
INTRAVENOUS | Status: DC
  Administered 2020-10-14: 0.4 [IU]/h via INTRAVENOUS

## 2020-10-14 MED ORDER — EPHEDRINE 5 MG/ML INJ
INTRAVENOUS | Status: AC
  Filled 2020-10-14: qty 10

## 2020-10-14 MED ORDER — CHLORHEXIDINE GLUCONATE CLOTH 2 % EX PADS
6.0000 | MEDICATED_PAD | Freq: Every day | CUTANEOUS | Status: DC
  Administered 2020-10-14 – 2020-10-17 (×4): 6 via TOPICAL

## 2020-10-14 MED ORDER — FAMOTIDINE IN NACL 20-0.9 MG/50ML-% IV SOLN: 20.0000 mg | Freq: Once | INTRAVENOUS | Status: DC

## 2020-10-14 MED ORDER — SODIUM CHLORIDE 0.45 % IV SOLN: INTRAVENOUS | Status: DC | PRN

## 2020-10-14 MED ORDER — BUPIVACAINE LIPOSOME 1.3 % IJ SUSP
20.0000 mL | Freq: Once | INTRAMUSCULAR | Status: DC
  Filled 2020-10-14: qty 20

## 2020-10-14 MED ORDER — PROTAMINE SULFATE 10 MG/ML IV SOLN
INTRAVENOUS | Status: AC
  Filled 2020-10-14: qty 25

## 2020-10-14 MED ORDER — MIDAZOLAM HCL (PF) 10 MG/2ML IJ SOLN
INTRAMUSCULAR | Status: AC
  Filled 2020-10-14: qty 2

## 2020-10-14 MED ORDER — DOCUSATE SODIUM 100 MG PO CAPS: 200.0000 mg | ORAL_CAPSULE | Freq: Every day | ORAL | Status: DC

## 2020-10-14 MED ORDER — ALBUMIN HUMAN 5 % IV SOLN: 250.0000 mL | INTRAVENOUS | Status: DC | PRN

## 2020-10-14 MED ORDER — SUCCINYLCHOLINE CHLORIDE 200 MG/10ML IV SOSY
PREFILLED_SYRINGE | INTRAVENOUS | Status: AC
  Filled 2020-10-14: qty 10

## 2020-10-14 MED ORDER — SODIUM BICARBONATE 8.4 % IV SOLN
50.0000 meq | Freq: Once | INTRAVENOUS | Status: AC
  Administered 2020-10-14: 50 meq via INTRAVENOUS

## 2020-10-14 MED ORDER — SODIUM CHLORIDE 0.9 % IV SOLN
INTRAVENOUS | Status: DC
  Administered 2020-10-14: 20 mL/h via INTRAVENOUS

## 2020-10-14 MED ORDER — ONDANSETRON HCL 4 MG/2ML IJ SOLN: 4.0000 mg | Freq: Four times a day (QID) | INTRAMUSCULAR | Status: DC | PRN

## 2020-10-14 MED ORDER — SODIUM CHLORIDE 0.9% FLUSH
3.0000 mL | Freq: Two times a day (BID) | INTRAVENOUS | Status: DC
  Administered 2020-10-15 – 2020-10-17 (×4): 3 mL via INTRAVENOUS

## 2020-10-14 MED ORDER — ALBUMIN HUMAN 5 % IV SOLN: INTRAVENOUS | Status: DC | PRN

## 2020-10-14 MED ORDER — FENTANYL CITRATE (PF) 250 MCG/5ML IJ SOLN
INTRAMUSCULAR | Status: DC | PRN
  Administered 2020-10-14: 100 ug via INTRAVENOUS
  Administered 2020-10-14: 150 ug via INTRAVENOUS
  Administered 2020-10-14: 450 ug via INTRAVENOUS
  Administered 2020-10-14: 50 ug via INTRAVENOUS

## 2020-10-14 MED ORDER — SODIUM ZIRCONIUM CYCLOSILICATE 10 G PO PACK
10.0000 g | PACK | Freq: Two times a day (BID) | ORAL | Status: AC
  Administered 2020-10-14: 10 g
  Filled 2020-10-14 (×2): qty 1

## 2020-10-14 MED ORDER — HEPARIN SODIUM (PORCINE) 1000 UNIT/ML IJ SOLN
INTRAMUSCULAR | Status: DC | PRN
  Administered 2020-10-14: 30000 [IU] via INTRAVENOUS

## 2020-10-14 MED ORDER — PANTOPRAZOLE SODIUM 40 MG PO TBEC: 40.0000 mg | DELAYED_RELEASE_TABLET | Freq: Every day | ORAL | Status: DC

## 2020-10-14 MED ORDER — MORPHINE SULFATE (PF) 2 MG/ML IV SOLN
1.0000 mg | INTRAVENOUS | Status: DC | PRN
  Administered 2020-10-14 – 2020-10-15 (×3): 4 mg via INTRAVENOUS
  Administered 2020-10-15 (×3): 2 mg via INTRAVENOUS
  Administered 2020-10-15: 4 mg via INTRAVENOUS
  Administered 2020-10-16 – 2020-10-17 (×5): 2 mg via INTRAVENOUS
  Administered 2020-10-17: 4 mg via INTRAVENOUS
  Filled 2020-10-14: qty 1
  Filled 2020-10-14: qty 2
  Filled 2020-10-14: qty 1
  Filled 2020-10-14: qty 2
  Filled 2020-10-14 (×2): qty 1
  Filled 2020-10-14 (×2): qty 2
  Filled 2020-10-14 (×3): qty 1
  Filled 2020-10-14 (×2): qty 2
  Filled 2020-10-14: qty 1

## 2020-10-14 MED ORDER — 0.9 % SODIUM CHLORIDE (POUR BTL) OPTIME
TOPICAL | Status: DC | PRN
  Administered 2020-10-14: 4000 mL

## 2020-10-14 MED ORDER — LACTATED RINGERS IV SOLN: INTRAVENOUS | Status: DC

## 2020-10-14 MED ORDER — FENTANYL CITRATE (PF) 250 MCG/5ML IJ SOLN
INTRAMUSCULAR | Status: AC
  Filled 2020-10-14: qty 5

## 2020-10-14 MED ORDER — TRAMADOL HCL 50 MG PO TABS: 50.0000 mg | ORAL_TABLET | ORAL | Status: DC | PRN

## 2020-10-14 MED ORDER — BUPIVACAINE HCL (PF) 0.5 % IJ SOLN
INTRAMUSCULAR | Status: AC
  Filled 2020-10-14: qty 30

## 2020-10-14 MED ORDER — SODIUM CHLORIDE 0.9 % IV SOLN: INTRAVENOUS | Status: DC | PRN

## 2020-10-14 MED ORDER — NITROGLYCERIN IN D5W 200-5 MCG/ML-% IV SOLN
0.0000 ug/min | INTRAVENOUS | Status: DC
  Administered 2020-10-15: 5 ug/min via INTRAVENOUS
  Filled 2020-10-14: qty 250

## 2020-10-14 MED ORDER — PROPOFOL 10 MG/ML IV BOLUS
INTRAVENOUS | Status: AC
  Filled 2020-10-14: qty 20

## 2020-10-14 MED ORDER — PROPOFOL 10 MG/ML IV BOLUS
INTRAVENOUS | Status: DC | PRN
  Administered 2020-10-14 (×3): 50 mg via INTRAVENOUS

## 2020-10-14 MED ORDER — PLASMA-LYTE 148 IV SOLN
INTRAVENOUS | Status: DC | PRN
  Administered 2020-10-14: 500 mL via INTRAVASCULAR

## 2020-10-14 MED ORDER — ROCURONIUM BROMIDE 10 MG/ML (PF) SYRINGE
PREFILLED_SYRINGE | INTRAVENOUS | Status: AC
  Filled 2020-10-14: qty 10

## 2020-10-14 MED ORDER — PHENYLEPHRINE HCL-NACL 20-0.9 MG/250ML-% IV SOLN
0.0000 ug/min | INTRAVENOUS | Status: DC
  Administered 2020-10-14: 60 ug/min via INTRAVENOUS
  Administered 2020-10-14: 25 ug/min via INTRAVENOUS
  Filled 2020-10-14: qty 250

## 2020-10-14 MED ORDER — PHENYLEPHRINE 40 MCG/ML (10ML) SYRINGE FOR IV PUSH (FOR BLOOD PRESSURE SUPPORT)
PREFILLED_SYRINGE | INTRAVENOUS | Status: AC
  Filled 2020-10-14: qty 10

## 2020-10-14 MED ORDER — ARTIFICIAL TEARS OPHTHALMIC OINT
TOPICAL_OINTMENT | OPHTHALMIC | Status: DC | PRN
  Administered 2020-10-14: 1 via OPHTHALMIC

## 2020-10-14 MED ORDER — SODIUM ZIRCONIUM CYCLOSILICATE 10 G PO PACK
10.0000 g | PACK | Freq: Two times a day (BID) | ORAL | Status: DC
  Filled 2020-10-14: qty 1

## 2020-10-14 MED ORDER — SODIUM CHLORIDE 0.9 % IV SOLN
1.5000 g | INTRAVENOUS | Status: AC
  Administered 2020-10-14 – 2020-10-15 (×2): 1.5 g via INTRAVENOUS
  Filled 2020-10-14 (×2): qty 1.5

## 2020-10-14 MED ORDER — BISACODYL 5 MG PO TBEC
10.0000 mg | DELAYED_RELEASE_TABLET | Freq: Every day | ORAL | Status: DC
  Administered 2020-10-15 – 2020-10-17 (×3): 10 mg via ORAL
  Filled 2020-10-14 (×3): qty 2

## 2020-10-14 MED ORDER — SODIUM CHLORIDE 0.9 % IV SOLN: 250.0000 mL | INTRAVENOUS | Status: DC

## 2020-10-14 MED ORDER — CHLORHEXIDINE GLUCONATE 0.12 % MT SOLN
15.0000 mL | OROMUCOSAL | Status: AC
  Administered 2020-10-14: 15 mL via OROMUCOSAL

## 2020-10-14 MED ORDER — OXYCODONE HCL 5 MG PO TABS: 5.0000 mg | ORAL_TABLET | ORAL | Status: DC | PRN

## 2020-10-14 MED ORDER — ASPIRIN EC 325 MG PO TBEC
325.0000 mg | DELAYED_RELEASE_TABLET | Freq: Every day | ORAL | Status: DC
  Administered 2020-10-15 – 2020-10-17 (×3): 325 mg via ORAL
  Filled 2020-10-14 (×3): qty 1

## 2020-10-14 MED ORDER — ACETAMINOPHEN 500 MG PO TABS
1000.0000 mg | ORAL_TABLET | Freq: Four times a day (QID) | ORAL | Status: DC
  Administered 2020-10-15 – 2020-10-17 (×10): 1000 mg via ORAL
  Filled 2020-10-14 (×11): qty 2

## 2020-10-14 MED ORDER — SODIUM CHLORIDE 0.9% IV SOLUTION: Freq: Once | INTRAVENOUS | Status: DC

## 2020-10-14 MED ORDER — INSULIN ASPART 100 UNIT/ML ~~LOC~~ SOLN: 0.0000 [IU] | SUBCUTANEOUS | Status: DC

## 2020-10-14 MED ORDER — VANCOMYCIN HCL 1000 MG IV SOLR
INTRAVENOUS | Status: DC | PRN
  Administered 2020-10-14: 1000 mL

## 2020-10-14 MED ORDER — PANTOPRAZOLE SODIUM 40 MG PO PACK: 40.0000 mg | PACK | Freq: Every day | ORAL | Status: DC

## 2020-10-14 MED ORDER — ACETAMINOPHEN 650 MG RE SUPP
650.0000 mg | Freq: Once | RECTAL | Status: AC
  Administered 2020-10-14: 650 mg via RECTAL

## 2020-10-14 MED ORDER — ASPIRIN 81 MG PO CHEW: 324.0000 mg | CHEWABLE_TABLET | Freq: Every day | ORAL | Status: DC

## 2020-10-14 MED ORDER — ROCURONIUM BROMIDE 10 MG/ML (PF) SYRINGE
PREFILLED_SYRINGE | INTRAVENOUS | Status: DC | PRN
  Administered 2020-10-14: 30 mg via INTRAVENOUS
  Administered 2020-10-14: 70 mg via INTRAVENOUS
  Administered 2020-10-14: 100 mg via INTRAVENOUS

## 2020-10-14 MED ORDER — ACETAMINOPHEN 160 MG/5ML PO SOLN
1000.0000 mg | Freq: Four times a day (QID) | ORAL | Status: DC
  Administered 2020-10-14: 1000 mg
  Filled 2020-10-14: qty 40.6

## 2020-10-14 MED ORDER — VANCOMYCIN HCL 500 MG/100ML IV SOLN
500.0000 mg | Freq: Once | INTRAVENOUS | Status: AC
  Administered 2020-10-14: 500 mg via INTRAVENOUS
  Filled 2020-10-14: qty 100

## 2020-10-14 MED ORDER — FAMOTIDINE IN NACL 20-0.9 MG/50ML-% IV SOLN
20.0000 mg | Freq: Every day | INTRAVENOUS | Status: AC
  Administered 2020-10-14 – 2020-10-15 (×2): 20 mg via INTRAVENOUS
  Filled 2020-10-14 (×2): qty 50

## 2020-10-14 MED ORDER — SODIUM CHLORIDE 0.9% FLUSH: 3.0000 mL | INTRAVENOUS | Status: DC | PRN

## 2020-10-14 MED ORDER — BUPIVACAINE LIPOSOME 1.3 % IJ SUSP
INTRAMUSCULAR | Status: DC | PRN
  Administered 2020-10-14: 50 mL

## 2020-10-14 MED ORDER — OXYCODONE HCL 5 MG PO TABS
5.0000 mg | ORAL_TABLET | ORAL | Status: DC | PRN
  Administered 2020-10-15 – 2020-10-16 (×5): 10 mg
  Filled 2020-10-14 (×5): qty 2

## 2020-10-14 MED ORDER — DEXTROSE 50 % IV SOLN: 0.0000 mL | INTRAVENOUS | Status: DC | PRN

## 2020-10-14 MED ORDER — TRAMADOL HCL 50 MG PO TABS
50.0000 mg | ORAL_TABLET | ORAL | Status: DC | PRN
  Administered 2020-10-15: 100 mg
  Filled 2020-10-14: qty 2

## 2020-10-14 MED ORDER — CHLORHEXIDINE GLUCONATE 0.12% ORAL RINSE (MEDLINE KIT)
15.0000 mL | Freq: Two times a day (BID) | OROMUCOSAL | Status: DC
  Administered 2020-10-14 – 2020-10-15 (×3): 15 mL via OROMUCOSAL

## 2020-10-14 MED ORDER — EPHEDRINE SULFATE-NACL 50-0.9 MG/10ML-% IV SOSY
PREFILLED_SYRINGE | INTRAVENOUS | Status: DC | PRN
  Administered 2020-10-14: 2.5 mg via INTRAVENOUS

## 2020-10-14 MED ORDER — SODIUM CHLORIDE (PF) 0.9 % IJ SOLN
INTRAMUSCULAR | Status: AC
  Filled 2020-10-14: qty 10

## 2020-10-14 MED ORDER — DEXMEDETOMIDINE HCL IN NACL 400 MCG/100ML IV SOLN
0.0000 ug/kg/h | INTRAVENOUS | Status: DC
  Administered 2020-10-14: 0.7 ug/kg/h via INTRAVENOUS
  Filled 2020-10-14: qty 100

## 2020-10-14 MED ORDER — ACETAMINOPHEN 160 MG/5ML PO SOLN: 650.0000 mg | Freq: Once | ORAL | Status: AC

## 2020-10-14 MED ORDER — ARTIFICIAL TEARS OPHTHALMIC OINT
TOPICAL_OINTMENT | OPHTHALMIC | Status: AC
  Filled 2020-10-14: qty 3.5

## 2020-10-14 MED ORDER — VANCOMYCIN HCL IN DEXTROSE 1-5 GM/200ML-% IV SOLN
1000.0000 mg | Freq: Once | INTRAVENOUS | Status: DC
  Filled 2020-10-14: qty 200

## 2020-10-14 MED ORDER — SODIUM CHLORIDE 0.9 % IV SOLN: INTRAVENOUS | Status: DC

## 2020-10-14 MED ORDER — HEPARIN SODIUM (PORCINE) 1000 UNIT/ML IJ SOLN
INTRAMUSCULAR | Status: AC
  Filled 2020-10-14: qty 1

## 2020-10-14 MED ORDER — DOCUSATE SODIUM 50 MG/5ML PO LIQD: 200.0000 mg | Freq: Every day | ORAL | Status: DC

## 2020-10-14 MED ORDER — PROTAMINE SULFATE 10 MG/ML IV SOLN
INTRAVENOUS | Status: DC | PRN
  Administered 2020-10-14 (×2): 40 mg via INTRAVENOUS
  Administered 2020-10-14: 20 mg via INTRAVENOUS
  Administered 2020-10-14 (×5): 40 mg via INTRAVENOUS

## 2020-10-14 MED ORDER — METOPROLOL TARTRATE 5 MG/5ML IV SOLN
2.5000 mg | INTRAVENOUS | Status: DC | PRN
  Administered 2020-10-15: 2.5 mg via INTRAVENOUS
  Filled 2020-10-14: qty 5

## 2020-10-14 MED ORDER — MIDAZOLAM HCL 2 MG/2ML IJ SOLN: 2.0000 mg | INTRAMUSCULAR | Status: DC | PRN

## 2020-10-14 MED ORDER — LIDOCAINE 2% (20 MG/ML) 5 ML SYRINGE
INTRAMUSCULAR | Status: AC
  Filled 2020-10-14: qty 5

## 2020-10-14 MED ORDER — FENTANYL CITRATE (PF) 250 MCG/5ML IJ SOLN
INTRAMUSCULAR | Status: AC
  Filled 2020-10-14: qty 10

## 2020-10-14 MED ORDER — ORAL CARE MOUTH RINSE
15.0000 mL | OROMUCOSAL | Status: DC
  Administered 2020-10-14 – 2020-10-15 (×3): 15 mL via OROMUCOSAL

## 2020-10-14 MED ORDER — MIDAZOLAM HCL 5 MG/5ML IJ SOLN
INTRAMUSCULAR | Status: DC | PRN
  Administered 2020-10-14: 1 mg via INTRAVENOUS
  Administered 2020-10-14: 5 mg via INTRAVENOUS
  Administered 2020-10-14: 3 mg via INTRAVENOUS
  Administered 2020-10-14: 1 mg via INTRAVENOUS

## 2020-10-14 MED ORDER — LACTATED RINGERS IV SOLN: 500.0000 mL | Freq: Once | INTRAVENOUS | Status: DC | PRN

## 2020-10-14 SURGICAL SUPPLY — 115 items
ADAPTER CARDIO PERF ANTE/RETRO (ADAPTER) ×4 IMPLANT
APPLICATOR COTTON TIP 6 STRL (MISCELLANEOUS) IMPLANT
APPLICATOR COTTON TIP 6IN STRL (MISCELLANEOUS) ×4 IMPLANT
BAG DECANTER FOR FLEXI CONT (MISCELLANEOUS) ×8 IMPLANT
BLADE CLIPPER SURG (BLADE) ×4 IMPLANT
BLADE SURG 11 STRL SS (BLADE) ×4 IMPLANT
CANISTER SUCT 3000ML PPV (MISCELLANEOUS) ×8 IMPLANT
CANNULA ADULT BIO-MEDICUS 15FR (CANNULA) ×1 IMPLANT
CANNULA FEM VENOUS REMOTE 22FR (CANNULA) ×1 IMPLANT
CANNULA GUNDRY RCSP 15FR (MISCELLANEOUS) ×4 IMPLANT
CANNULA OPTISITE PERFUSION 16F (CANNULA) IMPLANT
CANNULA OPTISITE PERFUSION 18F (CANNULA) ×1 IMPLANT
CANNULA SUMP PERICARDIAL (CANNULA) ×8 IMPLANT
CATH CPB KIT OWEN (MISCELLANEOUS) IMPLANT
CATH KIT ON-Q SILVERSOAK 5 (CATHETERS) IMPLANT
CATH KIT ON-Q SILVERSOAK 5IN (CATHETERS) IMPLANT
CELLS DAT CNTRL 66122 CELL SVR (MISCELLANEOUS) ×3 IMPLANT
CLOSURE PERCLOSE PROSTYLE (VASCULAR PRODUCTS) ×4 IMPLANT
CNTNR URN SCR LID CUP LEK RST (MISCELLANEOUS) ×3 IMPLANT
CONN ST 1/4X3/8  BEN (MISCELLANEOUS) ×2
CONN ST 1/4X3/8 BEN (MISCELLANEOUS) ×6 IMPLANT
CONNECTOR 1/2X3/8X1/2 3 WAY (MISCELLANEOUS) ×1
CONNECTOR 1/2X3/8X1/2 3WAY (MISCELLANEOUS) ×3 IMPLANT
CONT SPEC 4OZ STRL OR WHT (MISCELLANEOUS) ×2
COVER BACK TABLE 24X17X13 BIG (DRAPES) ×4 IMPLANT
COVER PROBE W GEL 5X96 (DRAPES) ×4 IMPLANT
DERMABOND ADHESIVE PROPEN (GAUZE/BANDAGES/DRESSINGS) ×1
DERMABOND ADVANCED (GAUZE/BANDAGES/DRESSINGS) ×1
DERMABOND ADVANCED .7 DNX12 (GAUZE/BANDAGES/DRESSINGS) ×6 IMPLANT
DERMABOND ADVANCED .7 DNX6 (GAUZE/BANDAGES/DRESSINGS) IMPLANT
DEVICE SUT CK QUICK LOAD INDV (Prosthesis & Implant Heart) ×3 IMPLANT
DEVICE SUT CK QUICK LOAD MINI (Prosthesis & Implant Heart) ×1 IMPLANT
DEVICE TROCAR PUNCTURE CLOSURE (ENDOMECHANICALS) ×4 IMPLANT
DRAIN CHANNEL 32F RND 10.7 FF (WOUND CARE) ×8 IMPLANT
DRAPE C-ARM 42X72 X-RAY (DRAPES) ×4 IMPLANT
DRAPE CV SPLIT W-CLR ANES SCRN (DRAPES) ×4 IMPLANT
DRAPE INCISE IOBAN 66X45 STRL (DRAPES) ×12 IMPLANT
DRAPE PERI GROIN 82X75IN TIB (DRAPES) ×4 IMPLANT
DRAPE SLUSH/WARMER DISC (DRAPES) ×4 IMPLANT
DRSG AQUACEL AG ADV 3.5X 6 (GAUZE/BANDAGES/DRESSINGS) ×1 IMPLANT
DRSG AQUACEL AG ADV 3.5X10 (GAUZE/BANDAGES/DRESSINGS) ×4 IMPLANT
ELECT BLADE 6.5 EXT (BLADE) ×4 IMPLANT
ELECT REM PT RETURN 9FT ADLT (ELECTROSURGICAL) ×8
ELECTRODE REM PT RTRN 9FT ADLT (ELECTROSURGICAL) ×6 IMPLANT
FELT TEFLON 1X6 (MISCELLANEOUS) ×4 IMPLANT
FEMORAL VENOUS CANN RAP (CANNULA) IMPLANT
GAUZE SPONGE 4X4 12PLY STRL (GAUZE/BANDAGES/DRESSINGS) ×1 IMPLANT
GAUZE SPONGE 4X4 12PLY STRL LF (GAUZE/BANDAGES/DRESSINGS) ×4 IMPLANT
GLOVE ORTHO TXT STRL SZ7.5 (GLOVE) ×12 IMPLANT
GLOVE SURG LTX SZ6.5 (GLOVE) ×3 IMPLANT
GLOVE SURG LTX SZ7 (GLOVE) ×4 IMPLANT
GLOVE SURG SS PI 7.5 STRL IVOR (GLOVE) ×1 IMPLANT
GLOVE SURG UNDER POLY LF SZ6.5 (GLOVE) ×3 IMPLANT
GLOVE SURG UNDER POLY LF SZ7 (GLOVE) ×1 IMPLANT
GOWN STRL REUS W/ TWL LRG LVL3 (GOWN DISPOSABLE) ×12 IMPLANT
GOWN STRL REUS W/TWL LRG LVL3 (GOWN DISPOSABLE) ×4
GRASPER SUT TROCAR 14GX15 (MISCELLANEOUS) ×4 IMPLANT
IV NS 1000ML (IV SOLUTION) ×1
IV NS 1000ML BAXH (IV SOLUTION) IMPLANT
IV NS IRRIG 3000ML ARTHROMATIC (IV SOLUTION) ×1 IMPLANT
KIT BASIN OR (CUSTOM PROCEDURE TRAY) ×4 IMPLANT
KIT DILATOR VASC 18G NDL (KITS) ×4 IMPLANT
KIT DRAINAGE VACCUM ASSIST (KITS) ×1 IMPLANT
KIT MICROPUNCTURE NIT STIFF (SHEATH) ×1 IMPLANT
KIT SUCTION CATH 14FR (SUCTIONS) ×4 IMPLANT
KIT SUT CK MINI COMBO 4X17 (Prosthesis & Implant Heart) ×1 IMPLANT
KIT TURNOVER KIT B (KITS) ×4 IMPLANT
LEAD PACING MYOCARDI (MISCELLANEOUS) ×4 IMPLANT
LINE VENT (MISCELLANEOUS) ×1 IMPLANT
NDL AORTIC ROOT 14G 7F (CATHETERS) ×3 IMPLANT
NEEDLE AORTIC ROOT 14G 7F (CATHETERS) ×4 IMPLANT
NS IRRIG 1000ML POUR BTL (IV SOLUTION) ×19 IMPLANT
PACK E MIN INVASIVE VALVE (SUTURE) ×4 IMPLANT
PACK OPEN HEART (CUSTOM PROCEDURE TRAY) ×4 IMPLANT
PAD ARMBOARD 7.5X6 YLW CONV (MISCELLANEOUS) ×8 IMPLANT
PAD ELECT DEFIB RADIOL ZOLL (MISCELLANEOUS) ×4 IMPLANT
POSITIONER HEAD DONUT 9IN (MISCELLANEOUS) ×4 IMPLANT
RETRACTOR WND ALEXIS 18 MED (MISCELLANEOUS) ×3 IMPLANT
RTRCTR WOUND ALEXIS 18CM MED (MISCELLANEOUS) ×4
SET CANNULATION TOURNIQUET (MISCELLANEOUS) ×4 IMPLANT
SET CARDIOPLEGIA MPS 5001102 (MISCELLANEOUS) ×1 IMPLANT
SET IRRIG TUBING LAPAROSCOPIC (IRRIGATION / IRRIGATOR) ×4 IMPLANT
SET MICROPUNCTURE 5F STIFF (MISCELLANEOUS) ×4 IMPLANT
SHEATH PINNACLE 8F 10CM (SHEATH) ×12 IMPLANT
SOL ANTI FOG 6CC (MISCELLANEOUS) ×3 IMPLANT
SOLUTION ANTI FOG 6CC (MISCELLANEOUS) ×1
SUT BONE WAX W31G (SUTURE) ×4 IMPLANT
SUT EB EXC GRN/WHT 2-0 V-5 (SUTURE) ×1 IMPLANT
SUT ETHIBOND 2 0 SH (SUTURE) ×2 IMPLANT
SUT ETHIBOND X763 2 0 SH 1 (SUTURE) ×4 IMPLANT
SUT GORETEX CV 4 TH 22 36 (SUTURE) IMPLANT
SUT GORETEX CV4 TH-18 (SUTURE) IMPLANT
SUT PROLENE 3 0 SH DA (SUTURE) ×1 IMPLANT
SUT PROLENE 3 0 SH1 36 (SUTURE) ×17 IMPLANT
SUT PROLENE 4 0 RB 1 (SUTURE) ×4
SUT PROLENE 4-0 RB1 .5 CRCL 36 (SUTURE) IMPLANT
SUT PROLENE 4-0 RB1 18X2 ARM (SUTURE) IMPLANT
SWAB COLLECTION DEVICE MRSA (MISCELLANEOUS) ×1 IMPLANT
SWAB CULTURE ESWAB REG 1ML (MISCELLANEOUS) ×1 IMPLANT
SYSTEM SAHARA CHEST DRAIN ATS (WOUND CARE) ×4 IMPLANT
TAPE CLOTH SURG 4X10 WHT LF (GAUZE/BANDAGES/DRESSINGS) ×1 IMPLANT
TAPE PAPER 2X10 WHT MICROPORE (GAUZE/BANDAGES/DRESSINGS) ×1 IMPLANT
TOWEL GREEN STERILE (TOWEL DISPOSABLE) ×4 IMPLANT
TOWEL GREEN STERILE FF (TOWEL DISPOSABLE) ×4 IMPLANT
TRAY FOLEY SLVR 16FR TEMP STAT (SET/KITS/TRAYS/PACK) ×4 IMPLANT
TROCAR XCEL BLADELESS 5X75MML (TROCAR) ×4 IMPLANT
TROCAR XCEL NON-BLD 11X100MML (ENDOMECHANICALS) ×8 IMPLANT
TUBE SUCT INTRACARD DLP 20F (MISCELLANEOUS) ×4 IMPLANT
TUBING ART PRESS 48 MALE/FEM (TUBING) ×1 IMPLANT
TUNNELER SHEATH ON-Q 11GX8 DSP (PAIN MANAGEMENT) IMPLANT
UNDERPAD 30X36 HEAVY ABSORB (UNDERPADS AND DIAPERS) ×4 IMPLANT
VALVE MITRAL SZ 31 (Prosthesis & Implant Heart) ×1 IMPLANT
WATER STERILE IRR 1000ML POUR (IV SOLUTION) ×8 IMPLANT
WIRE EMERALD 3MM-J .035X150CM (WIRE) ×4 IMPLANT
WIRE HI TORQ VERSACORE-J 145CM (WIRE) ×1 IMPLANT

## 2020-10-14 NOTE — Hospital Course (Addendum)
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    History of Present Illness  Patient is a 50 year old African-American male with history of end-stage renal disease secondary to polycystic kidney disease on hemodialysis since January 2015, longstanding poorly controlled hypertension, active hepatitis C with history of cirrhosis, history of medical noncompliance and polysubstance abuse, obstructive sleep apnea, and chronic combined systolic and diastolic congestive heart failure who has been referred for surgical consultation to discuss treatment options for management of 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    Patient denies any history of heart murmur or previous history of rheumatic fever or any other type of heart disease.  He has been followed intermittently over the last several years by Dr. Radford Pax for poorly controlled hypertension with chronic combined systolic and diastolic congestive heart failure.  Echocardiogram performed March 2021 revealed severe left ventricular hypertrophy which had progressed in comparison with previous echocardiograms with ejection fraction estimated 55 to 60%.  There was no sign of significant mitral valve disease at the time.  A small patent foramen ovale was noted.   Patient states that he was in his usual state of health until approximately 2 weeks ago when he developed relatively sudden onset of history of fever, chills, generalized weakness and poor appetite.  He  presented to the emergency department September 27, 2020 where he was febrile greater than 103 F and tachycardic with stable blood pressure.  Admission laboratory data revealed leukocytosis and lactic acidosis.  Chest x-ray revealed mild opacity at the left lung base.  CT scan of the chest, abdomen, and pelvis revealed no acute findings although there was a small cavitary mass in the left upper lobe.  Blood cultures ultimately grew methicillin-resistant Staphylococcus aureus.  The patient was started on empiric antibiotics for presumed pneumonia.  The patient developed acute mental status changes with increased lethargy and code stroke was activated.  CT of the head revealed 10 mm infarct in the medial left cerebellar hemisphere and MRI was recommended.  MRI confirmed the presence of multiple small acute infarcts in the bilateral cerebral hemispheres, right basal ganglia, callosal splenium and bilateral cerebellar hemispheres with findings highly suspicious for an embolic process.  There was also foci of chronic hemorrhage within the bilateral cerebellar hemispheres and cerebellar vermis with a few scattered supratentorial chronic microhemorrhages mixed with mild degenerative atrophy of the brain.  MRA revealed no intracranial large vessel occlusion nor proximal high-grade arterial stenosis.  Transthoracic echocardiogram performed September 28, 2020 revealed normal left ventricular systolic function with left ventricular hypertrophy.  There was felt to be mild to moderate mitral regurgitation.  There was trivial aortic insufficiency.  Agitated saline contrast bubble study was negative.  Transesophageal echocardiogram was recommended to exclude cardiac source of embolism.  The patient was seen in consultation by the neurology and infectious disease teams.  Clinically the patient's mental status improved and the patient defervesced on antibiotic therapy.  White blood count initially rose to as high as 41,800 but has  slowly decreased since then to 20,600 on October 05, 2020.  TEE performed October 05, 2020 revealed extensive bulky vegetations on both mitral valve leaflets with possible perforation of the posterior leaflet  and mild to moderate mitral regurgitation.  The aortic valve appeared normal.  Left atrium was moderately dilated.  Left and right ventricular systolic function were normal.  Cardiothoracic surgical consultation was requested.   Dr. Roxy Manns reviewed the patient's medical record and examined the patient at the bedside.  He reviewed the patient's multiple radiographic tests and echocardiograms.  Patient has methicillin resistant Staph aureus bacterial endocarditis.  Although the original portal of entry remains unclear in the setting of chronic hemodialysis the presumed portal of entry is likely related to dialysis treatment.  The patient's endocarditis has been complicated by septic embolization to the brain with multiple small embolic strokes.  The patient does not appear to have significant residual neurologic sequelae.  Clinically the patient seems to be doing well on medical therapy.  His fevers have resolved and white blood count is slowly trending towards normal.  He reports that his appetite is improved and his bowel function is normal.  He does not have any signs nor symptoms of congestive heart failure and he remains in sinus rhythm without AV conduction delay.   Transesophageal echocardiogram reveals bulky vegetations adherent to both the anterior and posterior leaflet of the mitral valve.  There is mild to moderate mitral regurgitation and there may be localized perforation of the posterior leaflet, although mitral regurgitation is not severe.  There is no sign of paravalvular abscess nor other valve related complications of the patient's endocarditis.   Options include continued observation on medical therapy versus high risk surgical intervention to include mitral valve repair or replacement.  Although  there are no absolute indications for surgical intervention at this time, the presence of extremely bulky vegetations with likely significant damage to the mitral valve and poor prognosis associated with methicillin-resistant Staphylococcus aureus infections increases the chance that the patient may not do well on long-term antibiotic therapy without surgical intervention.  Hospital Course:  While medical therapy continued, further work up was accomplished with left heart catheterization and dental evaluation. The left heart cath showed the coronary arteries to be free of any occlusive disease.  The dental evaluation revealed multiple dental caries and periodontal disease. He was taken to the OR on 10/12/20 by Dr. Sandi Mariscal, DMD for multiple extractions. He remained stable and was returned to the OR by Dr. Roxy Manns on 10/14/20 where Mitral valve debridement and subsequent replacement were carried out. Additionally, a patent foremen ovale identified at the time of surgery was closed primarily. He received 2 units PRBC's while on cardiopulmonary bypass along with 2 units FFP postoperatively to correct his coagulopathy. Following the procedure, he separated from cardiopulmonary bypass without difficulty and was transferred to the ICU in stable condition. He remained hemodynamically stable. He was extubated areound midnight on the day of surgery.  On post-op day 1 he was requiring a nitroglycerin infusion for hypertension. He was significantly volume overloaded by weight  and was dialyzed by the nephrology team.

## 2020-10-14 NOTE — Anesthesia Procedure Notes (Signed)
Central Venous Catheter Insertion Performed by: Audry Pili, MD, anesthesiologist Start/End3/05/2021 7:05 AM, 10/14/2020 7:07 AM Patient location: Pre-op. Preanesthetic checklist: patient identified, IV checked, risks and benefits discussed, surgical consent, monitors and equipment checked, pre-op evaluation, timeout performed and anesthesia consent Position: Trendelenburg Hand hygiene performed  and maximum sterile barriers used  Total catheter length 10. PA cath was placed.Swan type:thermodilution PA Cath depth:57 Procedure performed without using ultrasound guided technique. Attempts: 1 Patient tolerated the procedure well with no immediate complications.

## 2020-10-14 NOTE — Transfer of Care (Signed)
Immediate Anesthesia Transfer of Care Note  Patient: Patrick Anthony  Procedure(s) Performed: MINIMALLY INVASIVE MITRAL VALVE  REPLACEMENT(MVR) USING MEDTRONIC MOSAIC VALVE SIZE 31MM (Right Chest) TRANSESOPHAGEAL ECHOCARDIOGRAM (TEE) (N/A ) CLOSURE OF PATENT FORAMEN OVALE (N/A Chest)  Patient Location: ICU  Anesthesia Type:General  Level of Consciousness: sedated, unresponsive and Patient remains intubated per anesthesia plan  Airway & Oxygen Therapy: Patient remains intubated per anesthesia plan and Patient placed on Ventilator (see vital sign flow sheet for setting)  Post-op Assessment: Report given to RN and Post -op Vital signs reviewed and stable  Post vital signs: Reviewed and stable  Last Vitals:  Vitals Value Taken Time  BP 94/75 10/14/20 1419  Temp    Pulse 80 10/14/20 1422  Resp 20 10/14/20 1422  SpO2 97 % 10/14/20 1422  Vitals shown include unvalidated device data.  Last Pain:  Vitals:   10/14/20 0421  TempSrc: Axillary  PainSc:       Patients Stated Pain Goal: 0 ([id number])  Complications: No complications documented.

## 2020-10-14 NOTE — Progress Notes (Signed)
Nutrition Follow-up  DOCUMENTATION CODES:   Not applicable  INTERVENTION:   -Liberalize diet   -Snacks BID   -Nepro Carb Steady BID, each supplement provides 420 kcal, 19 grams protein   -Rena-Vit po daily  NUTRITION DIAGNOSIS:   Increased nutrient needs related to chronic illness (ESRD on HD) as evidenced by estimated needs.  Ongoing.  GOAL:   Patient will meet greater than or equal to 90% of their needs  Progressing.  MONITOR:   PO intake,Supplement acceptance,Skin,I & O's,Diet advancement,Labs,Weight trends  REASON FOR ASSESSMENT:   Consult Diet education,Assessment of nutrition requirement/status,Malnutrition Eval  ASSESSMENT:   82 YOM admitted for pneumonia d/t infectious agent. PMH of cirrhosis of liver, drug abuse, ETOH abuse, anemia, CHF, Hepatitis C, HTN, ESRD on HD. Recent onset of sepsis d/t MRSA, acute CVA.  3/7 - HD 3/08 - dental procedure, multiple decayed tooth extractions 3/10 - R MVR, TEE, repiar of patent foramen ovale  Pt reports that he has been weak which has made it difficult to eat. Pt reports that he is hungry and feels he would have an even better appetite if his diet advanced from clear liquids. Intern spoke with RN about a diet advancement. RN agreed that a diet advancement was appropriate and mentioned that the pt consumed all of his breakfast. Pt and RN denied any GI distress.   Pt's weights reviewed. Per chart EDW is 82.5 kg. Pt's recorded weights show a concerning decrease since admission, dry weight loss possible. Admit weight: 185# Current weight: 183.86#  Meds reviewed: Phoslo (TID), Sensipar (daily), Dulcolax (daily), Colace (daily)  Labs reviewed: CBG (79 - 121)   Chest tube output: 930 mL x 24 hrs I&O's reviewed: -2,963.4 since admission  Diet Order:   Diet Order            Diet NPO time specified  Diet effective now                 EDUCATION NEEDS:   Education needs have been addressed (RD discussed with pt  protein's importance and protein sources)  Skin:  Skin Assessment: Skin Integrity Issues: Skin Integrity Issues:: Incisions Incisions: R chest, lip, R groin  Last BM:  3/07  Height:   Ht Readings from Last 1 Encounters:  10/14/20 6' (1.829 m)    Weight:   Wt Readings from Last 1 Encounters:  10/14/20 73.7 kg    Ideal Body Weight:  80.9 kg  BMI:  Body mass index is 22.03 kg/m.  Estimated Nutritional Needs:   Kcal:  2200-2400  Protein:  105-120 g  Fluid:  1.2 L    Salvadore Oxford, Dietetic Intern 10/15/2020 12:20 PM

## 2020-10-14 NOTE — Discharge Instructions (Signed)
Mitral Valve Replacement, Care After This sheet gives you information about how to care for yourself after your procedure. Your health care provider may also give you more specific instructions. If you have problems or questions, contact your health care provider. What can I expect after the procedure? After the procedure, it is common to have pain at the incision area. This may last for several weeks. Follow these instructions at home: Incision care  Follow instructions from your health care provider about how to take care of your incision. Make sure you: ? Wash your hands with soap and water before and after you change your bandage (dressing). If soap and water are not available, use hand sanitizer. ? Change your dressing as told by your health care provider. ? Leave stitches (sutures), skin glue, or adhesive strips in place. These skin closures may need to stay in place for 2 weeks or longer. If adhesive strip edges start to loosen and curl up, you may trim the loose edges. Do not remove adhesive strips completely unless your health care provider tells you to do that.  Check your incision area every day for signs of infection. Check for: ? Redness, swelling, or pain. ? Fluid or blood. ? Warmth. ? Pus or a bad smell.  Do not apply powder or lotion to the area.    Bathing  Do not take baths, swim, or use a hot tub until your health care provider approves. Ask your health care provider if you may take showers. You may only be allowed to take sponge baths.  To wash the incision site, gently wash with soap and water and pat the area dry with a clean towel. Do not rub the incision area. That may cause bleeding. Activity  Rest as told by your health care provider.  Avoid sitting for a long time without moving, and avoid crossing your legs. Get up to take short walks every 1-2 hours. This is important to improve blood flow and breathing. Ask for help if you feel weak or unsteady.  Return  to your normal activities as told by your health care provider. Ask your health care provider what activities are safe for you.  Avoid the following activities for 6-8 weeks, or as long as directed: ? Lifting anything that is heavier than 10 lb (4.5 kg), or the limit that you are told. ? Pushing or pulling things with your arms.  Avoid climbing stairs and using the handrail to pull yourself up for the first 2-3 weeks after surgery.  Avoid airplane travel for 4-6 weeks, or as long as directed.  If you are taking blood thinners (anticoagulants), avoid activities that have a high risk of injury. Ask your health care provider what activities are safe for you. Medicines  Take over-the-counter and prescription medicines only as told by your health care provider.  If you are taking blood thinners: ? Talk with your health care provider before you take any medicines that contain aspirin or NSAIDs. These medicines increase your risk for dangerous bleeding. ? Take your medicine exactly as told, at the same time every day. ? Avoid activities that could cause injury or bruising. ? Follow instructions about how to prevent falls. ? Wear a medical alert bracelet or carry a card that lists what medicines you take.  Ask your health care provider if the medicine prescribed to you: ? Requires you to avoid driving or using heavy machinery. ? Can cause constipation. You may need to take actions to prevent or  treat constipation, such as:  Drink enough fluid to keep your urine pale yellow.  Take over-the-counter or prescription medicines.  Eat foods that are high in fiber, such as beans, whole grains, and fresh fruits and vegetables.  Limit foods that are high in fat and processed sugars, such as fried or sweet foods. General instructions  Take your temperature every day and weigh yourself every morning for the first 7 days after surgery. Write your temperatures and weight down and take this record with  you to any follow-up visits.  Wear compression stockings as told by your health care provider. These stockings may help to prevent blood clots and reduce swelling in your legs. You may be asked to wear these stockings for at least 2 weeks after your procedure. If your ankles are swollen after 2 weeks, contact your health care provider to see if you should continue to wear the stockings.  Follow instructions from your health care provider about eating or drinking restrictions.  Do not drink alcohol until your health care provider approves.  Do not drive until your health care provider approves.  Do not use any products that contain nicotine or tobacco, such as cigarettes, e-cigarettes, and chewing tobacco. If you need help quitting, ask your health care provider.  Keep all follow-up visits as told by your health care provider. This is important.    Contact a health care provider if:  You develop a skin rash.  Your weight is increasing each day over 2-3 days.  You gain 2 lb (1 kg) or more in a single day. Get help right away if:  You develop chest pain that feels different from the pain caused by your incision.  You develop shortness of breath or difficulty breathing.  You have a fever.  You have redness, swelling, or pain around your incision.  You have fluid or blood coming from your incision.  Your incision feels warm to the touch.  You have pus or a bad smell coming from your incision.  You feel light-headed. Summary  After the procedure, it is common to have pain at the incision area. This may last for several weeks.  Take your temperature every day and weigh yourself every morning for the first 7 days after surgery.  Check your incision area every day for signs of infection.  Keep all follow-up visits as told by your health care provider. This is important. This information is not intended to replace advice given to you by your health care provider. Make sure you  discuss any questions you have with your health care provider. Document Revised: 04/16/2018 Document Reviewed: 04/16/2018 Elsevier Patient Education  2021 [place].   Discharge Instructions:  1. You may shower, please wash incisions daily with soap and water and keep dry.  If you wish to cover wounds with dressing you may do so but please keep clean and change daily.  No tub baths or swimming until incisions have completely healed.  If your incisions become red or develop any drainage please call our office at [phone]  2. No Driving until cleared by Dr. Guy Sandifer office and you are no longer using narcotic pain medications  3. Monitor your weight daily.. Please use the same scale and weigh at same time... If you gain 5-10 lbs in 48 hours with associated lower extremity swelling, please contact our office at [phone]  4. Fever of 101.5 for at least 24 hours with no source, please contact our office at [phone]  5. Activity-  up as tolerated, please walk at least 3 times per day.  Avoid strenuous activity, no lifting, pushing, or pulling with your arms over 8-10 lbs for a minimum of 6 weeks  6. If any questions or concerns arise, please do not hesitate to contact our office at [place] Dr. Debe Coder B. Benson [place], D.M.D. Phone: [phone] Fax: [phone]     MOUTH CARE AFTER SURGERY   FACTS:  Ice used in ice bag helps keep the swelling down, and can help lessen the pain.  It is easier to treat pain BEFORE it happens.  Spitting disturbs the clot and may cause bleeding to start again, or to get worse.  Smoking delays healing and can cause complications.  Sharing prescriptions can be dangerous.  Do not take medications not recently prescribed for you.  Antibiotics may stop birth control pills from working.  Use other means of birth control while on antibiotics.  Warm salt water rinses after the first 24 hours  will help lessen the swelling:  Use 1/2 teaspoonful of table salt per oz.of water.  DO NOT:  Do not spit.    Do not drink through a straw.  Strongly advised not to smoke, dip snuff or chew tobacco at least for 3 days.  Do not eat sharp or crunchy foods.  Avoid the area of surgery when chewing.  Do not stop your antibiotics before your instructions say to do so.  Do not eat hot foods until bleeding has stopped.  If you need to, let your food cool down to room temperature.  EXPECT:  Some swelling, especially first 2-3 days.  Soreness or discomfort in varying degrees.  Follow your dentist's instructions about how to handle pain before it starts.  Pinkish saliva or light blood in saliva, or on your pillow in the morning.  This can last around 24 hours.  Bruising inside or outside the mouth.  This may not show up until 2-3 days after surgery.  Don't worry, it will go away in time.  Pieces of "bone" may work themselves loose.  It's OK.  If they bother you, let us know.  WHAT TO DO IMMEDIATELY AFTER SURGERY:  Bite on gauze with steady pressure for 30-45 minutes at a time.  Switch out the gauze after 30-45 minutes for clean gauze, and continue this for 1-2 hours or until bleeding subsides.  Do not chew on the gauze.  Do not lie down flat.  Raise your head support especially for the first 24 hours.  Apply ice to your face on the side of the surgery.  You may apply it 20 minutes on and a few minutes off.  Ice for 8-12 hours.  You may use ice up to 24 hours.  Before the numbness wears off, take a pain pill as instructed.  Prescription pain medication is not always required.  SWELLING:  Expect swelling for the first couple of days.  It should get better after that.  If swelling increases 3 days or so after surgery, let us know as soon as possible.  FEVER:  Take Tylenol every 4 hours if needed to lower your temperature, especially if it is at 100F or higher.  Drink lots of  fluids.  If the fever does not go away, let us know.  BREATHING TROUBLE:  Any unusual difficulty breathing means you have to have someone bring you to the emergency room ASAP.  BLEEDING:  Light oozing is expected for 24  hours or so.  Prop head up with pillows.  Do not spit.  Do not confuse bright red fresh flowing blood with lots of saliva colored with a little bit of blood.  If you notice some bleeding, place gauze or a tea bag where it is bleeding and apply CONSTANT pressure by biting down for 1 hour.  Avoid talking during this time.  Do not remove the gauze or tea bag during this hour to "check" the bleeding.  If you notice bright RED bleeding FLOWING out of particular area, and filling the floor of your mouth, put a wad of gauze on that area, bite down firmly and constantly.  Call us immediately.  If we're closed, have someone bring you to the emergency room.  ORAL HYGIENE:  Brush your teeth as usual after meals and before bedtime.  Use a soft toothbrush around the area of surgery.  DO NOT AVOID BRUSHING.  Otherwise bacteria(germs) will grow and may delay healing or encourage infection.  Since you cannot spit, just gently rinse and let the water flow out of your mouth.  DO NOT SWISH HARD.  EATING:  Cool liquids are a good point to start.  Increase to soft foods as tolerated.  PRESCRIPTIONS:  Follow the directions for your prescriptions exactly as written.  If your doctor gave you a narcotic pain medication, do not drive, operate machinery or drink alcohol when on that medication.   QUESTIONS?  Call our office during office hours [phone] or call the Emergency Room at [phone].

## 2020-10-14 NOTE — Brief Op Note (Addendum)
09/27/2020 - 10/14/2020  1:13 PM  PATIENT:  Patrick Anthony  50 y.o. male  PRE-OPERATIVE DIAGNOSIS: Mitral Valve ENDOCARDITIS,  mitral regurgitation  POST-OPERATIVE DIAGNOSIS: Mitral Valve ENDOCARDITIS,  mitral regurgitation, Patent Foramen Ovale   PROCEDURES:    -MINIMALLY INVASIVE MITRAL VALVE  REPLACEMENT(MVR) USING MEDTRONIC MOSAIC VALVE SIZE 31MM   -CLOSURE OF PATENT FORAMEN OVALE   -TRANSESOPHAGEAL ECHOCARDIOGRAM    SURGEON:   Rexene Alberts, MD - Primary  PHYSICIAN ASSISTANT:  Roddenberry  ASSISTANTS: Staff RNFA  ANESTHESIA:   general  EBL:  Per anesthesia and perfusion records   BLOOD ADMINISTERED:2 units PRBC's on pump CC PRBC  DRAINS: Left pleural and mediastinal drains   LOCAL MEDICATIONS USED:  Exparel local  SPECIMEN:  Source of Specimen:  Mitral valve leaflets and vegetations.   DISPOSITION OF SPECIMEN:  PATHOLOGY  COUNTS:  Correct  DICTATION: .Dragon Dictation  PLAN OF CARE: Admit to inpatient   PATIENT DISPOSITION:  ICU - intubated and critically ill.   Delay start of Pharmacological VTE agent (>24hrs) due to surgical blood loss or risk of bleeding: yes

## 2020-10-14 NOTE — Anesthesia Procedure Notes (Signed)
Central Venous Catheter Insertion Performed by: Audry Pili, MD, anesthesiologist Start/End3/05/2021 6:54 AM, 10/14/2020 7:07 AM Patient location: Pre-op. Preanesthetic checklist: patient identified, IV checked, risks and benefits discussed, surgical consent, monitors and equipment checked, pre-op evaluation, timeout performed and anesthesia consent Position: Trendelenburg Lidocaine 1% used for infiltration and patient sedated Hand hygiene performed , maximum sterile barriers used  and Seldinger technique used Catheter size: 8.5 Fr Central line was placed.MAC introducer Procedure performed using ultrasound guided technique. Ultrasound Notes:anatomy identified, needle tip was noted to be adjacent to the nerve/plexus identified, no ultrasound evidence of intravascular and/or intraneural injection and image(s) printed for medical record Attempts: 1 Following insertion, line sutured, dressing applied and Biopatch. Post procedure assessment: blood return through all ports, no air and free fluid flow  Patient tolerated the procedure well with no immediate complications.

## 2020-10-14 NOTE — Op Note (Signed)
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  Patent Foramen Ovale  Postoperative Diagnosis: Same  Procedure:    Minimally-Invasive Mitral Valve Replacement  Medtronic Mosaic stented porcine bioprosthetic tissue valve (size 7m, model # 310, serial # [id number]   Closure of patent foramen ovale   Surgeon: CValentina Gu ORoxy Manns MD  Assistant: MEnid Cutter PA-C  Anesthesia: WArabella Merles MD  Operative Findings:  Active bacterial endocarditis with bulky vegetation  Perforation of posterior leaflet  Moderate posterior mitral annular calcification  Type I dysfunction with moderate mitral regurgitation  Normal left ventricular systolic function                  BRIEF CLINICAL NOTE AND INDICATIONS FOR SURGERY  Patient is a Patrick Anthony old African-American male with history of end-stage renal disease secondary to polycystic kidney disease on hemodialysis since January 2015, longstanding poorly controlled hypertension, active hepatitis C with history of cirrhosis, history of medical noncompliance and polysubstance abuse, obstructive sleep apnea, and chronic combined systolic and diastolic congestive heart failure who has been referred for surgical consultation to discuss treatment options for management of 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   Patient denies any history of heart murmur or previous history of rheumatic fever or any other type of heart disease.  He has been followed intermittently over the last several years by Dr. TRadford Paxfor poorly controlled hypertension with chronic combined systolic and diastolic congestive  heart failure.  Echocardiogram performed March 2021 revealed severe left ventricular hypertrophy which had progressed in comparison with previous echocardiograms with ejection fraction estimated 55 to 60%.  There was no sign of significant mitral valve disease at the time.  A small patent foramen ovale was noted.  Patient states that he was in his usual state of health until approximately 2 weeks ago when he developed relatively sudden onset of history of fever, chills, generalized weakness and poor appetite.  He presented to the emergency department September 27, 2020 where he was febrile greater than 103 F and tachycardic with stable blood pressure.  Admission laboratory data revealed leukocytosis and lactic acidosis.  Chest x-ray revealed mild opacity at the left lung base.  CT scan of the chest, abdomen, and pelvis revealed no acute findings although there was a small cavitary mass in the left upper lobe.  Blood cultures ultimately grew methicillin-resistant Staphylococcus aureus.  The patient was started on empiric antibiotics for presumed pneumonia.  The patient developed acute mental status changes with increased lethargy and code stroke was activated.  CT of the head revealed 10 mm infarct in the medial left cerebellar hemisphere and MRI was recommended.  MRI confirmed the presence of multiple small acute infarcts in the bilateral cerebral hemispheres, right basal ganglia, callosal splenium and bilateral cerebellar hemispheres with findings highly suspicious for an embolic process.  There was also foci of chronic hemorrhage within the bilateral cerebellar hemispheres and cerebellar vermis with a few scattered supratentorial chronic microhemorrhages mixed with mild degenerative atrophy of the brain.  MRA revealed no intracranial large vessel occlusion nor proximal high-grade arterial stenosis.  Transthoracic echocardiogram performed September 28, 2020 revealed normal left ventricular systolic function with  left ventricular hypertrophy.  There was felt to be mild to moderate mitral regurgitation.  There was trivial aortic insufficiency.  Agitated saline contrast bubble study was negative.  Transesophageal echocardiogram was recommended to exclude cardiac source of embolism.  The patient was seen in consultation by the neurology and infectious disease teams.  Clinically the patient's mental status improved and the patient defervesced on antibiotic therapy.  White blood count initially rose to as high as 41,800 but has slowly decreased since then to 20,600 on October 05, 2020.  TEE performed October 05, 2020 revealed extensive bulky vegetations on both mitral valve leaflets with possible perforation of the posterior leaflet and mild to moderate mitral regurgitation.  The aortic valve appeared normal.  Left atrium was moderately dilated.  Left and right ventricular systolic function were normal.  Cardiothoracic surgical consultation was requested.  The patient has been seen in consultation and counseled at length regarding the indications, risks and potential benefits of surgery.  All questions have been answered, and the patient provides full informed consent for the operation as described.     DETAILS OF THE OPERATIVE PROCEDURE  Preparation:  The patient is brought to the operating room on the above mentioned date and central monitoring was established by the anesthesia team including placement of Swan-Ganz catheter through the left internal jugular vein.  A radial arterial line is placed. The patient is placed in the supine position on the operating table.  Intravenous antibiotics are administered. General endotracheal anesthesia is induced uneventfully. The patient is initially intubated using a dual lumen endotracheal tube.  A Foley catheter is placed.  Baseline transesophageal echocardiogram was performed.  Findings were notable for multiple large bulky vegetations adherent to the atrial surface of the mitral  valve, predominantly on the posterior mitral valve leaflet.  The vegetations were very mobile.  There were multiple jets of mitral regurgitation.  There was no sign of mitral annular abscess.  There was some posterior mitral annular calcification.  There was normal hyperdynamic left ventricular systolic function with moderate left ventricular hypertrophy.  The aortic valve is normal.  Right ventricular size and function was normal.  There was trivial tricuspid regurgitation.  A soft roll is placed behind the patient's left scapula and the neck gently extended and turned to the left.   The patient's right neck, chest, abdomen, both groins, and both lower extremities are prepared and draped in a sterile manner. A time out procedure is performed.   Percutaneous Vascular Access:  Percutaneous arterial and venous access were obtained on the left side.  Using ultrasound guidance the left common femoral vein was cannulated using the Seldinger technique a pair of Perclose vascular closure devises were placed at opposing 30 degree angles in the femoral vein, after which time an 8 French sheath inserted.  The left common femoral artery was cannulated using a micropuncture wire and sheath.  A pair of Perclose vascular closure devices were placed at opposing 30 degree angles in the femoral artery, and a 8 French sheath inserted.    Surgical Approach:  A right miniature anterolateral thoracotomy incision is performed. The incision is placed just lateral to and superior to the right nipple. The pectoralis major muscle is retracted medially and completely preserved. The right pleural space is entered through the 3rd intercostal space. A soft tissue retractor is placed.  Two 11 mm ports are placed through separate stab incisions inferiorly. The right pleural space is insufflated continuously with carbon dioxide gas through the posterior port during the remainder of the operation.  A pledgeted sutures placed through the  dome of the right hemidiaphragm and retracted  inferiorly to facilitate exposure.  A longitudinal incision is made in the pericardium 3 cm anterior to the phrenic nerve and silk traction sutures are placed on either side of the incision for exposure.   Extracorporeal Cardiopulmonary Bypass and Myocardial Protection:   The patient was heparinized systemically.  The right common femoral vein is cannulated through the venous sheath and a guidewire advanced into the right atrium using TEE guidance.  The femoral vein cannulated using a 22 Fr long femoral venous cannula.  The right common femoral artery is cannulated through the arterial sheath and a guidewire advanced into the descending thoracic aorta using TEE guidance.  Femoral artery is cannulated with a 18 French femoral arterial cannula.  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  Adequate heparinization is verified.   The entire pre-bypass portion of the operation was notable for stable hemodynamics.  Cardiopulmonary bypass was begun.  Vacuum assist venous drainage is utilized. The incision in the pericardium is extended in both directions. Venous drainage and exposure are notably suboptimal.  Subsequently a 15 [place] pediatric femoral venous cannula is placed directly in the superior vena cava under direct vision to supplement venous drainage.  An antegrade cardioplegia cannula is placed in the ascending aorta.    The patient is cooled to 32C systemic temperature.  The aortic cross clamp is applied and cardioplegia is delivered initially in an antegrade fashion through the aortic root using modified [place] cold blood cardioplegia ([place] blood cardioplegia protocol).   The initial cardioplegic arrest is rapid with early diastolic arrest.   Myocardial  protection was felt to be excellent.   Closure of Patent Foramen Ovale:  A left atriotomy incision was performed through the interatrial groove and extended partially across the back wall of the left atrium after opening the oblique sinus inferiorly.  Inspection of the left atrial surface of the fossa ovalis is notable for the presence of a patent foramen ovale.  The patent foramen ovale is closed with running 4-0 Prolene suture.   Mitral Valve Replacement:  The mitral valve is exposed using a self-retaining retractor.  The mitral valve was inspected and notable for extensive bulky acute suppurative vegetations adherent primarily to the atrial surface of the posterior leaflet of the mitral valve.  Portions of the vegetations are sent for routine aerobic and anaerobic culture.  All of the actively infected leaflet tissue is debrided and excised.  It immediately becomes apparent that valve replacement will be necessary due to the extensive amount of debridement required.  An obvious perforation in the middle portion of the posterior leaflet is encountered.  There is also extensive posterior mitral annular calcification which appears to be the original site of deepest infection.  Eventually after removal of all of the leaflet tissue and devitalized tissue the valve is irrigated with copious saline solution.  Attempt to preserve any of the subvalvular apparatus are aborted due to the extensive amount of debridement required.  Once debridement has been completed the entire posterior annulus is patent with Betadine solution and again irrigated with copious saline solution.  There is no sign of any active infection extending into the tissue surrounding the annulus and there is no annular abscess.  The valve is sized to accept a 31 mm stented porcine bioprosthetic tissue valve.  Mitral valve replacement is performed using interrupted 2-0 Ethibond  horizontal mattress pledgeted sutures with pledgets in the supra  annular position.  A Medtronic Mosaic stented porcine bioprosthetic tissue valve (size 1m, model #310, serial #[id number] was implanted uneventfully.  Care was taken to orient the valve such as the stent pose straddle the left ventricular outflow tract appropriately.  All valve sutures are secured using a Cor-knot device.    The valve was tested with saline and appeared competent with no sign of paravalvular leak.  Rewarming is begun.   Procedure Completion:  The atriotomy was closed using a 2-layer closure of running 3-0 Prolene suture after placing a sump drain across the mitral valve to serve as a left ventricular vent.  One final dose of warm "reanimation dose" cardioplegia was administered the aortic root.  The aortic cross clamp was removed after a total cross clamp time of 112 minutes.  Epicardial pacing wires are fixed to the inferior wall of the right ventricule and to the right atrial appendage. The patient is rewarmed to 37C temperature. The left ventricular vent and antegrade cardioplegia cannula are removed.  The pericardial sac was drained using a 32 French Bard drain placed through the anterior port incision.  The superior vena cava cannula was removed.  The patient is weaned and disconnected from cardiopulmonary bypass.  The patient's rhythm at separation from bypass was AV paced.  The patient was weaned from bypass without any inotropic support. Total cardiopulmonary bypass time for the operation was 169 minutes.  Followup transesophageal echocardiogram performed after separation from bypass revealed a well-seated bioprosthetic tissue valve in the mitral position that was functioning normally. There was no paravalvular leak.  Left ventricular function was unchanged from preoperatively.  The mean gradient across the mitral valve was estimated to be 2 mmHg.  The femoral arterial and venous cannulas were removed and all Perclose sutures secured.  Manual pressure was maintained while  Protamine was administered.  Single lung ventilation was begun. The atriotomy closure was inspected for hemostasis.  The right pleural space is irrigated with saline solution and inspected for hemostasis.   A mixture of Exparel liposomal bupivacaine (20 mL) and 0.5% bupivacaine (30 mL) is utilized to create an intercostal nerve block for postoperative analgesia.  The mixture is injected under direct vision into the intercostal neurovascular bundles posteriorly to cover the second through the sixth intercostal nerve roots.  Portions of the solution are also injected into the intercostal neurovascular bundles immediately surrounding the surgical incision and immediately adjacent to the chest tube exit sites.  The right pleural space was drained using a 32 French Bard drain placed through the posterior port incision. The miniature thoracotomy incision was closed in multiple layers in routine fashion.   The post-bypass portion of the operation was notable for stable rhythm and hemodynamics.  The patient received 2 units packed red blood cells during the procedure due to anemia which was present preoperatively and exacerbated by acute blood loss and hemodilution during cardiopulmonary bypass.    Disposition:  The patient tolerated the procedure well.  The patient was reintubated using a single lumen endotracheal tube and subsequently transported to the surgical intensive care unit in stable condition. There were no intraoperative complications. All sponge instrument and needle counts are verified correct at completion of the operation.     CValentina Gu ORoxy MannsMD 10/14/2020 1:20 PM

## 2020-10-14 NOTE — Progress Notes (Addendum)
[facility] Progress Note   Subjective: seen in room post Minimally Invasive MV Repair. Sedated on low dose neo. MAP 73.  Objective Vitals:   10/14/20 1545 10/14/20 1600 10/14/20 1615 10/14/20 1630  BP:    93/72  Pulse: 80 80 80 80  Resp: _0 Temp: (!) 96.44 F (35.8 C) (!) 96.62 F (35.9 C) (!) 96.8 F (36 C) (!) 97.16 F (36.2 C)  TempSrc:  Core    SpO2: 100% 100% 100% 100%  Weight:      Height:       Physical Exam General:Chronically ill appearing male orally intubated, sedated on vent HEENT: Face is swollen with periorbital edema.  Heart:S1,S2 now with mitral click.A Paced on montitor.  Lungs:Bilateral breath sounds, decreased in bases, otherwise CTAB. L Chest tubes X 2 400cc bloody drainage to pleuravac.  Abdomen:OG in place. No BS.  Extremities:No LE Edema. Dialysis Access:L AVG + Bruit   Additional Objective Labs: Basic Metabolic Panel: Recent Labs  Lab 10/11/20 1203 10/12/20 1226 10/13/20 0145 10/14/20 0406 10/14/20 0818 10/14/20 1124 10/14/20 1154 10/14/20 1222 10/14/20 1254 10/14/20 1257 10/14/20 1433  NA 129*   < > 127* 132*   < > 134*   < > 133* 136 137 137  K 5.0   < > 4.6 4.6   < > 5.7*   < > 6.5* 5.8* 5.8* 5.5*  CL 90*   < > 88* 94*   < > 99  --  102 101  --   --   CO2 23  --  23 28  --   --   --   --   --   --   --   GLUCOSE 109*   < > 89 87   < > 134*  --  146* 143*  --   --   BUN 56*   < > 36* 19   < > 22*  --  24* 22*  --   --   CREATININE 11.02*   < > 8.15* 5.60*   < > 5.20*  --  5.30* 5.10*  --   --   CALCIUM 8.1*  --  8.4* 9.1  --   --   --   --   --   --   --   PHOS 7.8*  --   --  5.8*  --   --   --   --   --   --   --    < > = values in this interval not displayed.   Liver Function Tests: Recent Labs  Lab 10/11/20 1203 10/13/20 0145 10/14/20 0406  AST  --  27 35  ALT  --  12 10  ALKPHOS  --  52 46  BILITOT  --  1.0 0.8  PROT  --  8.1 8.7*  ALBUMIN 2.1* 2.3* 2.4*   No results for input(s):  LIPASE, AMYLASE in the last 168 hours. CBC: Recent Labs  Lab 10/08/20 0335 10/11/20 1203 10/12/20 1226 10/13/20 0145 10/14/20 0406 10/14/20 0818 10/14/20 1147 10/14/20 1154 10/14/20 1257 10/14/20 1404 10/14/20 1433  WBC 19.2* 14.9*  --  12.1* 10.2  --   --   --   --  17.6*  --   HGB 9.3* 7.5*   < > 7.5* 7.9*   < > 6.4*   < > 9.2* 9.2* 9.9*  HCT 29.7* 22.8*   < > 24.1* 25.1*   < > 20.8*   < >  27.0* 29.3* 29.0*  MCV 85.6 84.4  --  86.1 85.7  --   --   --   --  89.1  --   PLT 295 322  --  251 231  --  165  --   --  121*  --    < > = values in this interval not displayed.   Blood Culture    Component Value Date/Time   SDES BLOOD RIGHT THUMB 10/13/2020 2243   SDES BLOOD RIGHT HAND 10/13/2020 2243   SPECREQUEST  10/13/2020 2243    BOTTLES DRAWN AEROBIC AND ANAEROBIC Blood Culture adequate volume   SPECREQUEST  10/13/2020 2243    BOTTLES DRAWN AEROBIC AND ANAEROBIC Blood Culture adequate volume   CULT  10/13/2020 2243    NO GROWTH < 12 HOURS Performed at [facility], [place] [address], [place], [place] [address]    CULT  10/13/2020 2243    NO GROWTH < 12 HOURS Performed at [facility] [address], [place],  [address]    REPTSTATUS PENDING 10/13/2020 2243   REPTSTATUS PENDING 10/13/2020 2243    Cardiac Enzymes: No results for input(s): CKTOTAL, CKMB, CKMBINDEX, TROPONINI in the last 168 hours. CBG: No results for input(s): GLUCAP in the last 168 hours. Iron Studies: No results for input(s): IRON, TIBC, TRANSFERRIN, FERRITIN in the last 72 hours. _0 @ Studies/Results: DG Chest 2 View  Result Date: 10/13/2020 CLINICAL DATA:  Congestive heart failure, malaise EXAM: CHEST - 2 VIEW COMPARISON:  10/06/2020 FINDINGS: Pulmonary insufflation is normal and symmetric. Left upper lobe peripheral cavitary nodule is grossly unchanged from prior CT examination,. No pneumothorax or pleural effusion. Cardiac size is mildly enlarged. Pulmonary vascularity is  normal. Vascular stent seen within the expected left subclavian vein. 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 IMPRESSION: No radiographic evidence of acute cardiopulmonary disease. Stable cavitary nodule within the left upper lobe, indeterminate. Tumoral calcinosis surrounding the right shoulder. Electronically Signed   By: Fidela Salisbury MD   On: 10/13/2020 06:52   DG Chest Port 1 View  Result Date: 10/14/2020 CLINICAL DATA:  Hypoxia.  Status post mitral valve replacement EXAM: PORTABLE CHEST 1 VIEW COMPARISON:  October 13, 2020 FINDINGS: Endotracheal tube tip is 4.0 cm above the carina. Swan-Ganz catheter tip is in the proximal right main pulmonary artery. Nasogastric tube tip and side port are below the diaphragm. There is a right chest tube. Temporary pacemaker wires are attached to the right heart. No pneumothorax. There is mild atelectasis in the lung bases. Lungs elsewhere are clear. There is cardiomegaly. Status post mitral valve replacement. Stent noted in the left subclavian region. 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 IMPRESSION: Tube and catheter positions as described without evident pneumothorax. Bibasilar atelectasis. Cardiomegaly. Status post mitral valve replacement. 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 Electronically Signed   By: Lowella Grip III M.D.   On: 10/14/2020 15:25   ECHO INTRAOPERATIVE TEE  Result Date: 10/14/2020  *INTRAOPERATIVE TRANSESOPHAGEAL REPORT *  Patient Name:   ERNEST POPOWSKI Date of Exam: 10/14/2020 Medical Rec #:  [id number]      Height:       72.0 in Accession #:    [id number]     Weight:       162.4 lb Date of Birth:  Dec 31, 1970      BSA:          1.95 m Patient Age:    50 years  BP:           126/89 mmHg Patient Gender: M              HR:           94 bpm. Exam Location:  Inpatient Transesophogeal exam was perform intraoperatively during surgical  procedure. Patient was closely monitored under general anesthesia during the entirety of examination. Indications:     Mitral Valve Disease; Endocarditis Sonographer:     Mikki Santee RDCS (AE) Performing Phys: [place] Diagnosing Phys: Roderic Palau MD Complications: No known complications during this procedure. POST-OP IMPRESSIONS - Left Ventricle: The left ventricle is unchanged from pre-bypass. - Right Ventricle: The right ventricle appears unchanged from pre-bypass. - Left Atrial Appendage: The left atrial appendage appears unchanged from pre-bypass. - Aortic Valve: The aortic valve appears unchanged from pre-bypass. - Mitral Valve: A porcine bioprosthetic valve was placed, leaflets are freely mobile and leaflets thin Manufactured by; Med[employer]onic Size; 31. No regurgitation post repair. The gradient recorded across the prosthetic valve is within the expected range. - Tricuspid Valve: The tricuspid valve appears unchanged from pre-bypass. - Pulmonic Valve: The pulmonic valve appears unchanged from pre-bypass. PRE-OP FINDINGS  Left Ventricle: The left ventricle has hyperdynamic systolic function, with an ejection fraction of >65%. The cavity size was normal. There is mildly increased left ventricular wall thickness. There is mild concentric left ventricular hypertrophy. Right Ventricle: The right ventricle has normal systolic function. The cavity was not assessed. There is no increase in right ventricular wall thickness. Left Atrium: Left atrial size was dilated. No left atrial/left atrial appendage thrombus was detected. Right Atrium: Right atrial size was dilated. Interatrial Septum: No atrial level shunt detected by color flow Doppler. Agitated saline contrast bubble study was negative, with no evidence of any interatrial shunt. There is no evidence of a patent foramen ovale. Pericardium: There is no evidence of pericardial effusion. Mitral Valve: Abnormal. Mitral valve regurgitation is  moderate by color flow Doppler. There is moderate thickening present on the mitral valve posterior cusp. There is a large mobile mitral valve vegetation present on the posterior, P1, P2, P3 and A1 cusps. Tricuspid Valve: The tricuspid valve was normal in structure. Tricuspid valve regurgitation is trivial by color flow Doppler. Aortic Valve: The aortic valve is normal in structure. Aortic valve regurgitation was not visualized by color flow Doppler. Pulmonic Valve: The pulmonic valve was normal in structure. Pulmonic valve regurgitation is trivial by color flow Doppler. +--------------+--------++ LEFT VENTRICLE         +--------------+--------++ PLAX 2D                +--------------+--------++ LVIDd:        2.90 cm  +--------------+--------++ LVIDs:        2.70 cm  +--------------+--------++ LVOT diam:    2.00 cm  +--------------+--------++ LV SV:        5 ml     +--------------+--------++ LV SV Index:  2.69     +--------------+--------++ LVOT Area:    3.14 cm +--------------+--------++                        +--------------+--------++ +-------------+---------++ MITRAL VALVE           +--------------+-------+ +-------------+---------++ SHUNTS                MV Peak grad:8.1 mmHg  +--------------+-------+ +-------------+---------++ Systemic Diam:2.00 cm MV Mean grad:2.0 mmHg  +--------------+-------+ +-------------+---------++ MV Vmax:  1.42 m/s  +-------------+---------++ MV Vmean:    71.4 cm/s +-------------+---------++ MV VTI:      0.38 m    +-------------+---------++  Roderic Palau MD Electronically signed by Roderic Palau MD Signature Date/Time: 10/14/2020/4:15:51 PM    Final    Medications: . sodium chloride    . sodium chloride 20 mL/hr at 10/14/20 1600  . [START ON 10/15/2020] sodium chloride    . sodium chloride 10 mL/hr at 10/14/20 1600  . albumin human    . cefUROXime (ZINACEF)  IV    . dexmedetomidine (PRECEDEX) IV  infusion 0.3 mcg/kg/hr (10/14/20 1600)  . famotidine (PEPCID) IV Stopped (10/14/20 1529)  . insulin 0.4 Units/hr (10/14/20 1636)  . lactated ringers    . lactated ringers    . lactated ringers    . nitroGLYCERIN    . phenylephrine (NEO-SYNEPHRINE) Adult infusion 50 mcg/min (10/14/20 1600)  . vancomycin     . sodium chloride   Intravenous Once  . acetaminophen  1,000 mg Oral Q6H   Or  . acetaminophen (TYLENOL) oral liquid 160 mg/5 mL  1,000 mg Per Tube Q6H  . [START ON 10/15/2020] aspirin EC  325 mg Oral Daily   Or  . [START ON 10/15/2020] aspirin  324 mg Per Tube Daily  . [START ON 10/15/2020] bisacodyl  10 mg Oral Daily   Or  . [START ON 10/15/2020] bisacodyl  10 mg Rectal Daily  . chlorhexidine gluconate (MEDLINE KIT)  15 mL Mouth Rinse BID  . [START ON 10/15/2020] docusate sodium  200 mg Oral Daily  . influenza vac split quadrivalent PF  0.5 mL Intramuscular Tomorrow-1000  . mouth rinse  15 mL Mouth Rinse 10 times per day  . [START ON 10/16/2020] pantoprazole  40 mg Oral Daily  . [START ON 10/15/2020] sodium chloride flush  3 mL Intravenous Q12H     OP HD:MWF [facility] 4h 350/500 82.5kg 2/2.5 bath P1 LUA AVG -Heparin 5000 units IV TIW -Aranesp 10mgIVq 2wks - last 09/27/20 (Verified order) -Hectorol36m IV qHD   Assessment/Plan: 1. 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 Cavitary mass LUL possibly septic emboli. ID following on Vancomycin. Has LAVG w/oovertsigns of infection. Seen by Dr. OwRoxy MannsMVR 10/14/20. S/P tooth extraction3/8/22.Per Primary. 2. Hyperkalemia-K+ has been persistently elevated. Will order Lokelma 10 grams per OG tube BID. Follow labs.  3. 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 AMS -Resolved. 5. ESRD - usual HD MWF.Next HD probably 10/16/20. Will assess tomorrow for final decision.  6. Hypertension/volume- H/O resistant hypertension and medical noncompliance. Had been  down titrating antihypertensive meds prior to surgery. Now + 4 liters. On low dose neo.  7. Anemiaof CKD-HGB 9.2. S/P 2 units of PRBCs today.  8. Secondary Hyperparathyroidism -resume sensipar, binders when eating. Continue VDRA  9. Nutrition- NPO at present.  10. Hx Hep C /liver cirrhosis.  Hailei Besser H. Axelle Szwed NP-C 10/14/2020, 5:04 PM  [phone]

## 2020-10-14 NOTE — Care Plan (Addendum)
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 Code stroke was called on hospital day 1 and imaging showed embolic strokes. His blood cultures were positive for staph aureus and he had TEE on 3/1 which is concerning for mitral valve endocarditis. Currently on vancomycin. ID, Neurology, cardiology, and CT surgery are following. He underwent excision of teeth 3/8 , he underwent minimally invasive mitral valve replacement, closure of PFO, transesophageal echocardiogram today.  Patient is upgraded to ICU. Patient was seen in the ICU postprocedure,  Patient will remain in ICU until stablized.  Patient went to the OR earlier. I was not able to see him in the morning. We will sign off at this time.

## 2020-10-14 NOTE — Interval H&P Note (Signed)
History and Physical Interval Note:  10/14/2020 7:27 AM  Patrick Anthony  has presented today for surgery, with the diagnosis of MR MV ENDOCARDITIS.  The various methods of treatment have been discussed with the patient and family. After consideration of risks, benefits and other options for treatment, the patient has consented to  Procedure(s): MINIMALLY INVASIVE MITRAL VALVE REPAIR OR REPLACEMENT(MVR) (Right) TRANSESOPHAGEAL ECHOCARDIOGRAM (TEE) (N/A) as a surgical intervention.  The patient's history has been reviewed, patient examined, no change in status, stable for surgery.  I have reviewed the patient's chart and labs.  Questions were answered to the patient's satisfaction.     Rexene Alberts

## 2020-10-14 NOTE — Progress Notes (Signed)
TCTS Evening Rounds  DOS s/p MVR Hemodyn stable Transfused for coagulopathy; reasonable CT output CXR clear  BP (!) 88/73   Pulse 80   Temp 97.7 F (36.5 C)   Resp 12   Ht 6' (1.829 m)   Wt 73.7 kg   SpO2 100%   BMI 22.03 kg/m  Sedated/intubated   Intake/Output Summary (Last 24 hours) at 10/14/2020 1709 Last data filed at 10/14/2020 1600 Gross per 24 hour  Intake 4791.32 ml  Output 4100 ml  Net 691.32 ml    A/p: continue early postoperative care Wean vent as tolerated Navin Dogan Z. Orvan Seen, [place]

## 2020-10-14 NOTE — Anesthesia Procedure Notes (Signed)
Arterial Line Insertion Start/End3/05/2021 6:50 AM, 10/14/2020 7:05 AM Performed by: Harden Mo, CRNA, CRNA  Patient location: Pre-op. Preanesthetic checklist: patient identified, IV checked, site marked, risks and benefits discussed, surgical consent, monitors and equipment checked, pre-op evaluation and anesthesia consent Lidocaine 1% used for infiltration and patient sedated Right, radial was placed Catheter size: 20 G Hand hygiene performed  and maximum sterile barriers used  Allen's test indicative of satisfactory collateral circulation Attempts: 1 Procedure performed without using ultrasound guided technique. Ultrasound Notes:anatomy identified, needle tip was noted to be adjacent to the nerve/plexus identified and no ultrasound evidence of intravascular and/or intraneural injection Following insertion, dressing applied and Biopatch. Post procedure assessment: normal  Patient tolerated the procedure well with no immediate complications.

## 2020-10-14 NOTE — Anesthesia Postprocedure Evaluation (Signed)
Anesthesia Post Note  Patient: Patrick Anthony  Procedure(s) Performed: MINIMALLY INVASIVE MITRAL VALVE  REPLACEMENT(MVR) USING MEDTRONIC MOSAIC VALVE SIZE 31MM (Right Chest) TRANSESOPHAGEAL ECHOCARDIOGRAM (TEE) (N/A ) CLOSURE OF PATENT FORAMEN OVALE (N/A Chest)     Patient location during evaluation: SICU Anesthesia Type: General Level of consciousness: sedated Pain management: pain level controlled Vital Signs Assessment: post-procedure vital signs reviewed and stable Respiratory status: patient remains intubated per anesthesia plan Cardiovascular status: stable Postop Assessment: no apparent nausea or vomiting Anesthetic complications: no   No complications documented.  Last Vitals:  Vitals:   10/14/20 1530 10/14/20 1545  BP: 103/85   Pulse: 80 80  Resp: 18 14  Temp: (!) 35.5 C (!) 35.8 C  SpO2: 100% 100%    Last Pain:  Vitals:   10/14/20 1519  TempSrc: Core  PainSc:                  Shaylee Stanislawski,W. EDMOND

## 2020-10-14 NOTE — Anesthesia Procedure Notes (Signed)
Procedure Name: Intubation Date/Time: 10/14/2020 1:44 PM Performed by: Harden Mo, CRNA Pre-anesthesia Checklist: Patient identified, Emergency Drugs available, Suction available and Patient being monitored Patient Re-evaluated:Patient Re-evaluated prior to induction Oxygen Delivery Method: Circle System Utilized Preoxygenation: Pre-oxygenation with 100% oxygen Induction Type: Inhalational induction with existing ETT Laryngoscope Size: Glidescope and 4 Grade View: Grade I Tube type: Oral Tube size: 8.0 mm Number of attempts: 1 Airway Equipment and Method: Stylet,  Oral airway and Video-laryngoscopy Placement Confirmation: ETT inserted through vocal cords under direct vision,  positive ETCO2 and breath sounds checked- equal and bilateral Secured at: 23 cm Tube secured with: Tape Dental Injury: Teeth and Oropharynx as per pre-operative assessment  Comments: Cooke catheter used with direct visualization to exchange DLT for regular, 8.0 ETT.

## 2020-10-14 NOTE — Anesthesia Procedure Notes (Signed)
Procedure Name: Intubation Date/Time: 10/14/2020 8:08 AM Performed by: Harden Mo, CRNA Pre-anesthesia Checklist: Patient identified, Emergency Drugs available, Suction available and Patient being monitored Patient Re-evaluated:Patient Re-evaluated prior to induction Oxygen Delivery Method: Circle System Utilized Preoxygenation: Pre-oxygenation with 100% oxygen Induction Type: IV induction Ventilation: Mask ventilation with difficulty, Oral airway inserted - appropriate to patient size and Two handed mask ventilation required Laryngoscope Size: Glidescope and 4 Grade View: Grade I Endobronchial tube: Double lumen EBT, EBT position confirmed by auscultation and EBT position confirmed by fiberoptic bronchoscope and 39 Fr Number of attempts: 4 Airway Equipment and Method: Stylet,  Oral airway and Video-laryngoscopy Placement Confirmation: ETT inserted through vocal cords under direct vision,  positive ETCO2 and breath sounds checked- equal and bilateral Tube secured with: Tape Dental Injury: Teeth and Oropharynx as per pre-operative assessment  Difficulty Due To: Difficult Airway-  due to edematous airway Comments: Patient two days postop from dental extractions. DL x1 with Sabra Heck 2 by CRNA to obtain grade 2 view with cricoid pressure. Edematous airway anatomy noted with swollen epiglottis, and arytenoids. . Esophogeal intubation with DLT. DL x2 with Sabra Heck 2 by CRNA to obtain grade 3 view. Did not attempt to pass DLT. DL x3 by MDA, unable to obtain view. DL x 4 by MDA with glidescope 4 to obtain grade 1 view.

## 2020-10-14 NOTE — Progress Notes (Signed)
Pharmacy Antibiotic Note  Patrick Anthony is a 50 y.o. male admitted on 09/27/2020 with fever, chills, and generalized weakness.  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  Patient continues on Vancomycin per pharmacy.  Pt has ESRD with HD MWF.  Plans are for 6 weeks of antibiotics with day 1 being post-op day 1 from valve surgery per 3/7 ID note  3/10 > (4/21)  -Pre-HD vancomycin level= 29 Last HD 3/9 and vancomycin '500mg'$  given after HD Preop vanc '1250mg'$  x1 given this am > cardiopulmonary bypass machine removes vancomycin > will redose vancomycin post op   Plan: -Vancomycin '500mg'$  IV x1 post op tonight  Check Pre-HD vancomycin level to determine ongoing vancomycin dose needs - Will continue to follow HD schedule/duration  Height: 6' (182.9 cm) Weight: 73.7 kg (162 lb 6.4 oz) IBW/kg (Calculated) : 77.6  Temp (24hrs), Avg:98.2 F (36.8 C), Min:95.9 F (35.5 C), Max:102.1 F (38.9 C)  Recent Labs  Lab 10/08/20 0335 10/11/20 1203 10/12/20 1226 10/13/20 0145 10/14/20 0406 10/14/20 0818 10/14/20 0942 10/14/20 1024 10/14/20 1124 10/14/20 1222 10/14/20 1254 10/14/20 1404  WBC 19.2* 14.9*  --  12.1* 10.2  --   --   --   --   --   --  17.6*  CREATININE 8.59* 11.02*   < > 8.15* 5.60*   < > 5.60* 5.40* 5.20* 5.30* 5.10*  --   VANCOTROUGH  --  29*  --   --   --   --   --   --   --   --   --   --    < > = values in this interval not displayed.    Estimated Creatinine Clearance: 18.3 mL/min (A) (by C-G formula based on SCr of 5.1 mg/dL (H)).    No Known Allergies  Antimicrobials this admission: Vanc 2/21>> Cefepime 2/21>>2/22 Flagyl 2/21>>2/22 (2 doses given)  Dose adjustments this admission: 2/28 Pre-HD VR 41 - estimate post HD level 27 Dose held 2/28 and reduced to '750mg'$  IV qHD starting 3/2  Microbiology results: 2/21 blood x 2: MRSA, sens Vanc MIC < 0.5, Septra, TCN, gent MIC < 0.5, Clinda 2/22 BCID: MRSA  2/21 COVID and flu: negative 2/21 HIV:  non-reactive 2/24 blood x 2: 1/2 SA 2/27 blood x 2: neg    Bonnita Nasuti Pharm.D. CPP, BCPS Clinical Pharmacist [phone] 10/14/2020 4:01 PM

## 2020-10-15 ENCOUNTER — Other Ambulatory Visit: Payer: Self-pay

## 2020-10-15 ENCOUNTER — Inpatient Hospital Stay (HOSPITAL_COMMUNITY): Payer: Medicaid Other

## 2020-10-15 ENCOUNTER — Encounter (HOSPITAL_COMMUNITY): Payer: Self-pay | Admitting: Thoracic Surgery (Cardiothoracic Vascular Surgery)

## 2020-10-15 DIAGNOSIS — J90 Pleural effusion, not elsewhere classified: Secondary | ICD-10-CM | POA: Diagnosis not present

## 2020-10-15 DIAGNOSIS — N2581 Secondary hyperparathyroidism of renal origin: Secondary | ICD-10-CM | POA: Diagnosis not present

## 2020-10-15 DIAGNOSIS — I6310 Cerebral infarction due to embolism of unspecified precerebral artery: Secondary | ICD-10-CM | POA: Diagnosis not present

## 2020-10-15 DIAGNOSIS — Z992 Dependence on renal dialysis: Secondary | ICD-10-CM | POA: Diagnosis not present

## 2020-10-15 DIAGNOSIS — I517 Cardiomegaly: Secondary | ICD-10-CM | POA: Diagnosis not present

## 2020-10-15 DIAGNOSIS — D631 Anemia in chronic kidney disease: Secondary | ICD-10-CM | POA: Diagnosis not present

## 2020-10-15 DIAGNOSIS — Z9889 Other specified postprocedural states: Secondary | ICD-10-CM

## 2020-10-15 DIAGNOSIS — R0902 Hypoxemia: Secondary | ICD-10-CM | POA: Diagnosis not present

## 2020-10-15 DIAGNOSIS — R531 Weakness: Secondary | ICD-10-CM | POA: Diagnosis not present

## 2020-10-15 DIAGNOSIS — Z20822 Contact with and (suspected) exposure to covid-19: Secondary | ICD-10-CM | POA: Diagnosis not present

## 2020-10-15 DIAGNOSIS — I10 Essential (primary) hypertension: Secondary | ICD-10-CM | POA: Diagnosis not present

## 2020-10-15 DIAGNOSIS — I132 Hypertensive heart and chronic kidney disease with heart failure and with stage 5 chronic kidney disease, or end stage renal disease: Secondary | ICD-10-CM | POA: Diagnosis not present

## 2020-10-15 DIAGNOSIS — Q2112 Patent foramen ovale: Secondary | ICD-10-CM

## 2020-10-15 DIAGNOSIS — R29716 NIHSS score 16: Secondary | ICD-10-CM | POA: Diagnosis not present

## 2020-10-15 DIAGNOSIS — Z953 Presence of xenogenic heart valve: Secondary | ICD-10-CM

## 2020-10-15 DIAGNOSIS — R0602 Shortness of breath: Secondary | ICD-10-CM | POA: Diagnosis not present

## 2020-10-15 DIAGNOSIS — J9811 Atelectasis: Secondary | ICD-10-CM | POA: Diagnosis not present

## 2020-10-15 DIAGNOSIS — R29712 NIHSS score 12: Secondary | ICD-10-CM | POA: Diagnosis not present

## 2020-10-15 DIAGNOSIS — R29718 NIHSS score 18: Secondary | ICD-10-CM | POA: Diagnosis not present

## 2020-10-15 DIAGNOSIS — R29711 NIHSS score 11: Secondary | ICD-10-CM | POA: Diagnosis not present

## 2020-10-15 DIAGNOSIS — Q211 Atrial septal defect: Secondary | ICD-10-CM

## 2020-10-15 DIAGNOSIS — A4102 Sepsis due to Methicillin resistant Staphylococcus aureus: Secondary | ICD-10-CM | POA: Diagnosis not present

## 2020-10-15 DIAGNOSIS — E875 Hyperkalemia: Secondary | ICD-10-CM | POA: Diagnosis not present

## 2020-10-15 DIAGNOSIS — B9561 Methicillin susceptible Staphylococcus aureus infection as the cause of diseases classified elsewhere: Secondary | ICD-10-CM | POA: Diagnosis not present

## 2020-10-15 DIAGNOSIS — N186 End stage renal disease: Secondary | ICD-10-CM | POA: Diagnosis not present

## 2020-10-15 DIAGNOSIS — J189 Pneumonia, unspecified organism: Secondary | ICD-10-CM | POA: Diagnosis not present

## 2020-10-15 DIAGNOSIS — I2690 Septic pulmonary embolism without acute cor pulmonale: Secondary | ICD-10-CM | POA: Diagnosis not present

## 2020-10-15 DIAGNOSIS — I5032 Chronic diastolic (congestive) heart failure: Secondary | ICD-10-CM | POA: Diagnosis not present

## 2020-10-15 DIAGNOSIS — G928 Other toxic encephalopathy: Secondary | ICD-10-CM | POA: Diagnosis not present

## 2020-10-15 DIAGNOSIS — I330 Acute and subacute infective endocarditis: Secondary | ICD-10-CM | POA: Diagnosis not present

## 2020-10-15 DIAGNOSIS — R7881 Bacteremia: Secondary | ICD-10-CM | POA: Diagnosis not present

## 2020-10-15 DIAGNOSIS — I6340 Cerebral infarction due to embolism of unspecified cerebral artery: Secondary | ICD-10-CM | POA: Diagnosis not present

## 2020-10-15 DIAGNOSIS — A419 Sepsis, unspecified organism: Secondary | ICD-10-CM | POA: Diagnosis not present

## 2020-10-15 DIAGNOSIS — I639 Cerebral infarction, unspecified: Secondary | ICD-10-CM | POA: Diagnosis not present

## 2020-10-15 DIAGNOSIS — I340 Nonrheumatic mitral (valve) insufficiency: Secondary | ICD-10-CM

## 2020-10-15 LAB — CBC
HCT: 29.7 % — ABNORMAL LOW (ref 39.0–52.0)
HCT: 31.2 % — ABNORMAL LOW (ref 39.0–52.0)
Hemoglobin: 9.4 g/dL — ABNORMAL LOW (ref 13.0–17.0)
Hemoglobin: 9.9 g/dL — ABNORMAL LOW (ref 13.0–17.0)
MCH: 28.1 pg (ref 26.0–34.0)
MCH: 27.7 pg (ref 26.0–34.0)
MCHC: 31.6 g/dL (ref 30.0–36.0)
MCHC: 31.7 g/dL (ref 30.0–36.0)
MCV: 88.9 fL (ref 80.0–100.0)
MCV: 87.4 fL (ref 80.0–100.0)
Platelets: 140 10*3/uL — ABNORMAL LOW (ref 150–400)
Platelets: 167 10*3/uL (ref 150–400)
RBC: 3.34 MIL/uL — ABNORMAL LOW (ref 4.22–5.81)
RBC: 3.57 MIL/uL — ABNORMAL LOW (ref 4.22–5.81)
RDW: 16.4 % — ABNORMAL HIGH (ref 11.5–15.5)
RDW: 16.4 % — ABNORMAL HIGH (ref 11.5–15.5)
WBC: 14.2 10*3/uL — ABNORMAL HIGH (ref 4.0–10.5)
WBC: 16 10*3/uL — ABNORMAL HIGH (ref 4.0–10.5)
nRBC: 0 % (ref 0.0–0.2)
nRBC: 0 % (ref 0.0–0.2)

## 2020-10-15 LAB — POCT I-STAT 7, (LYTES, BLD GAS, ICA,H+H)
Acid-base deficit: 4 mmol/L — ABNORMAL HIGH (ref 0.0–2.0)
Acid-base deficit: 5 mmol/L — ABNORMAL HIGH (ref 0.0–2.0)
Acid-base deficit: 5 mmol/L — ABNORMAL HIGH (ref 0.0–2.0)
Acid-base deficit: 5 mmol/L — ABNORMAL HIGH (ref 0.0–2.0)
Bicarbonate: 19 mmol/L — ABNORMAL LOW (ref 20.0–28.0)
Bicarbonate: 19.2 mmol/L — ABNORMAL LOW (ref 20.0–28.0)
Bicarbonate: 20.1 mmol/L (ref 20.0–28.0)
Bicarbonate: 20.6 mmol/L (ref 20.0–28.0)
Calcium, Ion: 1.02 mmol/L — ABNORMAL LOW (ref 1.15–1.40)
Calcium, Ion: 1.02 mmol/L — ABNORMAL LOW (ref 1.15–1.40)
Calcium, Ion: 1.04 mmol/L — ABNORMAL LOW (ref 1.15–1.40)
Calcium, Ion: 1.06 mmol/L — ABNORMAL LOW (ref 1.15–1.40)
HCT: 28 % — ABNORMAL LOW (ref 39.0–52.0)
HCT: 29 % — ABNORMAL LOW (ref 39.0–52.0)
HCT: 29 % — ABNORMAL LOW (ref 39.0–52.0)
HCT: 30 % — ABNORMAL LOW (ref 39.0–52.0)
Hemoglobin: 10.2 g/dL — ABNORMAL LOW (ref 13.0–17.0)
Hemoglobin: 9.5 g/dL — ABNORMAL LOW (ref 13.0–17.0)
Hemoglobin: 9.9 g/dL — ABNORMAL LOW (ref 13.0–17.0)
Hemoglobin: 9.9 g/dL — ABNORMAL LOW (ref 13.0–17.0)
O2 Saturation: 90 %
O2 Saturation: 93 %
O2 Saturation: 95 %
O2 Saturation: 97 %
Patient temperature: 35.9
Patient temperature: 35.9
Patient temperature: 36.3
Patient temperature: 36.7
Potassium: 5 mmol/L (ref 3.5–5.1)
Potassium: 5 mmol/L (ref 3.5–5.1)
Potassium: 5.5 mmol/L — ABNORMAL HIGH (ref 3.5–5.1)
Potassium: 5.7 mmol/L — ABNORMAL HIGH (ref 3.5–5.1)
Sodium: 136 mmol/L (ref 135–145)
Sodium: 137 mmol/L (ref 135–145)
Sodium: 137 mmol/L (ref 135–145)
Sodium: 138 mmol/L (ref 135–145)
TCO2: 20 mmol/L — ABNORMAL LOW (ref 22–32)
TCO2: 20 mmol/L — ABNORMAL LOW (ref 22–32)
TCO2: 21 mmol/L — ABNORMAL LOW (ref 22–32)
TCO2: 22 mmol/L (ref 22–32)
pCO2 arterial: 30.6 mmHg — ABNORMAL LOW (ref 32.0–48.0)
pCO2 arterial: 30.9 mmHg — ABNORMAL LOW (ref 32.0–48.0)
pCO2 arterial: 32.9 mmHg (ref 32.0–48.0)
pCO2 arterial: 33.4 mmHg (ref 32.0–48.0)
pH, Arterial: 7.389 (ref 7.350–7.450)
pH, Arterial: 7.393 (ref 7.350–7.450)
pH, Arterial: 7.396 (ref 7.350–7.450)
pH, Arterial: 7.403 (ref 7.350–7.450)
pO2, Arterial: 56 mmHg — ABNORMAL LOW (ref 83.0–108.0)
pO2, Arterial: 63 mmHg — ABNORMAL LOW (ref 83.0–108.0)
pO2, Arterial: 72 mmHg — ABNORMAL LOW (ref 83.0–108.0)
pO2, Arterial: 93 mmHg (ref 83.0–108.0)

## 2020-10-15 LAB — GLUCOSE, CAPILLARY
Glucose-Capillary: 102 mg/dL — ABNORMAL HIGH (ref 70–99)
Glucose-Capillary: 119 mg/dL — ABNORMAL HIGH (ref 70–99)
Glucose-Capillary: 121 mg/dL — ABNORMAL HIGH (ref 70–99)
Glucose-Capillary: 126 mg/dL — ABNORMAL HIGH (ref 70–99)
Glucose-Capillary: 72 mg/dL (ref 70–99)
Glucose-Capillary: 79 mg/dL (ref 70–99)
Glucose-Capillary: 80 mg/dL (ref 70–99)
Glucose-Capillary: 87 mg/dL (ref 70–99)
Glucose-Capillary: 90 mg/dL (ref 70–99)
Glucose-Capillary: 92 mg/dL (ref 70–99)
Glucose-Capillary: 92 mg/dL (ref 70–99)
Glucose-Capillary: 93 mg/dL (ref 70–99)
Glucose-Capillary: 95 mg/dL (ref 70–99)

## 2020-10-15 LAB — BASIC METABOLIC PANEL
Anion gap: 15 (ref 5–15)
Anion gap: 12 (ref 5–15)
BUN: 30 mg/dL — ABNORMAL HIGH (ref 6–20)
BUN: 12 mg/dL (ref 6–20)
CO2: 18 mmol/L — ABNORMAL LOW (ref 22–32)
CO2: 24 mmol/L (ref 22–32)
Calcium: 7.9 mg/dL — ABNORMAL LOW (ref 8.9–10.3)
Calcium: 8.4 mg/dL — ABNORMAL LOW (ref 8.9–10.3)
Chloride: 101 mmol/L (ref 98–111)
Chloride: 98 mmol/L (ref 98–111)
Creatinine, Ser: 6.07 mg/dL — ABNORMAL HIGH (ref 0.61–1.24)
Creatinine, Ser: 3.34 mg/dL — ABNORMAL HIGH (ref 0.61–1.24)
GFR, Estimated: 11 mL/min — ABNORMAL LOW (ref >60)
GFR, Estimated: 22 mL/min — ABNORMAL LOW (ref >60)
Glucose, Bld: 122 mg/dL — ABNORMAL HIGH (ref 70–99)
Glucose, Bld: 125 mg/dL — ABNORMAL HIGH (ref 70–99)
Potassium: 5 mmol/L (ref 3.5–5.1)
Potassium: 3.7 mmol/L (ref 3.5–5.1)
Sodium: 134 mmol/L — ABNORMAL LOW (ref 135–145)
Sodium: 134 mmol/L — ABNORMAL LOW (ref 135–145)

## 2020-10-15 LAB — SURGICAL PATHOLOGY

## 2020-10-15 LAB — BPAM FFP
Blood Product Expiration Date: 202203132359
Blood Product Expiration Date: 202203132359
ISSUE DATE / TIME: 202203101331
ISSUE DATE / TIME: 202203101331
Unit Type and Rh: 600
Unit Type and Rh: 6200

## 2020-10-15 LAB — PREPARE FRESH FROZEN PLASMA: Unit division: 0

## 2020-10-15 LAB — MAGNESIUM
Magnesium: 2.6 mg/dL — ABNORMAL HIGH (ref 1.7–2.4)
Magnesium: 2 mg/dL (ref 1.7–2.4)

## 2020-10-15 IMAGING — DX DG CHEST 1V PORT
1 series · 1 of 1 positions shown · non-contrast
Comparison: [DATE]

CLINICAL DATA: Status post extubation.

EXAM:
PORTABLE CHEST 1 VIEW

[chest]
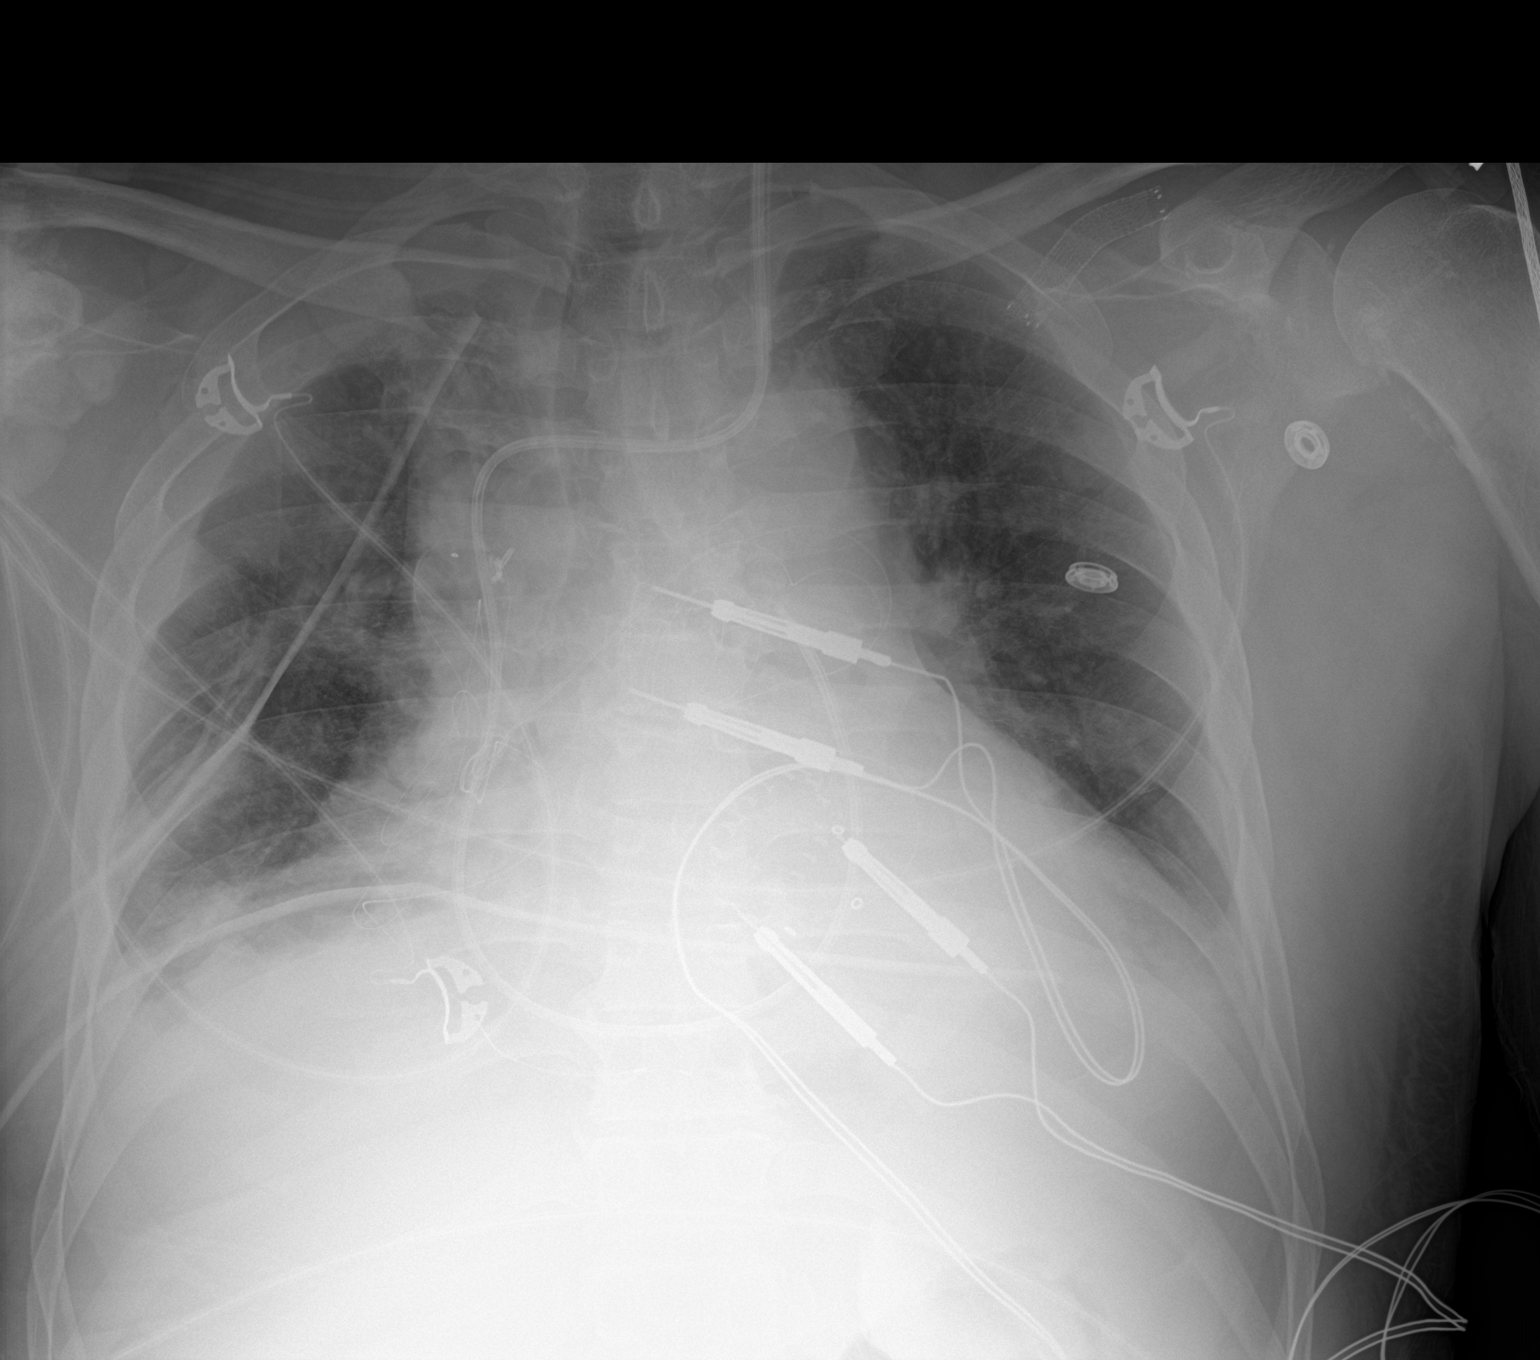

[1 of 1 positions shown; findings below may reference images not displayed]

FINDINGS: Endotracheal tube and nasogastric tube have been removed. Swan-Ganz
catheter tip is in the main pulmonary outflow tract. There is a
right chest tube. Temporary pacemaker wires are attached to the
right heart. There is no appreciable pneumothorax. There is a small
pleural effusion on each side with bibasilar atelectasis. There is
also atelectasis in the right upper lobe. There is cardiomegaly with
pulmonary vascularity normal. No adenopathy. Patient is status post
mitral valve replacement. Stent noted in left subclavian region. No
bone lesions.
IMPRESSION: Tube and catheter positions as described without pneumothorax. There
are new small pleural effusions bilaterally with atelectatic change
in each lung base and right upper lobe. Stable cardiomegaly.

## 2020-10-15 MED ORDER — HYDRALAZINE HCL 20 MG/ML IJ SOLN
20.0000 mg | Freq: Four times a day (QID) | INTRAMUSCULAR | Status: DC | PRN
  Administered 2020-10-15: 20 mg via INTRAVENOUS
  Filled 2020-10-15: qty 1

## 2020-10-15 MED ORDER — NEPRO/CARBSTEADY PO LIQD
237.0000 mL | Freq: Two times a day (BID) | ORAL | Status: DC
  Administered 2020-10-15 – 2020-10-21 (×6): 237 mL via ORAL

## 2020-10-15 MED ORDER — DOCUSATE SODIUM 50 MG/5ML PO LIQD: 200.0000 mg | Freq: Every day | ORAL | Status: DC

## 2020-10-15 MED ORDER — SODIUM CHLORIDE 0.9 % IV SOLN: 100.0000 mL | INTRAVENOUS | Status: DC | PRN

## 2020-10-15 MED ORDER — DOCUSATE SODIUM 100 MG PO CAPS
200.0000 mg | ORAL_CAPSULE | Freq: Every day | ORAL | Status: DC
  Administered 2020-10-15 – 2020-10-17 (×3): 200 mg via ORAL
  Filled 2020-10-15 (×6): qty 2

## 2020-10-15 MED ORDER — LABETALOL HCL 5 MG/ML IV SOLN: 10.0000 mg | INTRAVENOUS | Status: DC | PRN

## 2020-10-15 MED ORDER — VANCOMYCIN HCL IN DEXTROSE 500-5 MG/100ML-% IV SOLN
500.0000 mg | INTRAVENOUS | Status: DC
  Administered 2020-10-15: 500 mg via INTRAVENOUS
  Filled 2020-10-15: qty 100

## 2020-10-15 MED ORDER — INSULIN ASPART 100 UNIT/ML ~~LOC~~ SOLN
0.0000 [IU] | SUBCUTANEOUS | Status: DC
  Administered 2020-10-15: 2 [IU] via SUBCUTANEOUS

## 2020-10-15 MED ORDER — PENTAFLUOROPROP-TETRAFLUOROETH EX AERO: 1.0000 "application " | INHALATION_SPRAY | CUTANEOUS | Status: DC | PRN

## 2020-10-15 MED ORDER — METOPROLOL TARTRATE 25 MG PO TABS
25.0000 mg | ORAL_TABLET | Freq: Two times a day (BID) | ORAL | Status: DC
  Administered 2020-10-15 – 2020-10-17 (×6): 25 mg via ORAL
  Filled 2020-10-15 (×6): qty 1

## 2020-10-15 MED ORDER — LIDOCAINE HCL (PF) 1 % IJ SOLN: 5.0000 mL | INTRAMUSCULAR | Status: DC | PRN

## 2020-10-15 MED ORDER — CLONIDINE HCL 0.3 MG/24HR TD PTWK
0.3000 mg | MEDICATED_PATCH | TRANSDERMAL | Status: DC
  Administered 2020-10-15 – 2020-11-05 (×4): 0.3 mg via TRANSDERMAL
  Filled 2020-10-15 (×4): qty 1

## 2020-10-15 MED ORDER — DIPHENHYDRAMINE HCL 25 MG PO CAPS
25.0000 mg | ORAL_CAPSULE | Freq: Four times a day (QID) | ORAL | Status: DC | PRN
  Administered 2020-10-15 – 2020-10-16 (×2): 25 mg via ORAL
  Filled 2020-10-15 (×2): qty 1

## 2020-10-15 MED ORDER — LIDOCAINE-PRILOCAINE 2.5-2.5 % EX CREA
1.0000 "application " | TOPICAL_CREAM | CUTANEOUS | Status: DC | PRN
  Filled 2020-10-15: qty 5

## 2020-10-15 MED ORDER — RENA-VITE PO TABS
1.0000 | ORAL_TABLET | Freq: Every day | ORAL | Status: DC
  Administered 2020-10-15 – 2020-11-10 (×26): 1 via ORAL
  Filled 2020-10-15 (×26): qty 1

## 2020-10-15 MED ORDER — CALCIUM ACETATE (PHOS BINDER) 667 MG PO CAPS
667.0000 mg | ORAL_CAPSULE | Freq: Three times a day (TID) | ORAL | Status: DC
  Administered 2020-10-15 – 2020-11-10 (×60): 667 mg via ORAL
  Filled 2020-10-15 (×63): qty 1

## 2020-10-15 MED ORDER — GABAPENTIN 100 MG PO CAPS
200.0000 mg | ORAL_CAPSULE | Freq: Every day | ORAL | Status: DC
  Administered 2020-10-15 – 2020-10-29 (×14): 200 mg via ORAL
  Filled 2020-10-15 (×15): qty 2

## 2020-10-15 MED ORDER — CINACALCET HCL 30 MG PO TABS
30.0000 mg | ORAL_TABLET | Freq: Every day | ORAL | Status: DC
  Administered 2020-10-16 – 2020-10-29 (×11): 30 mg via ORAL
  Filled 2020-10-15 (×13): qty 1

## 2020-10-15 MED FILL — Lidocaine HCl Local Preservative Free (PF) Inj 2%: INTRAMUSCULAR | Qty: 15 | Status: AC

## 2020-10-15 MED FILL — Heparin Sodium (Porcine) Inj 1000 Unit/ML: INTRAMUSCULAR | Qty: 30 | Status: AC

## 2020-10-15 MED FILL — Potassium Chloride Inj 2 mEq/ML: INTRAVENOUS | Qty: 40 | Status: AC

## 2020-10-15 NOTE — Progress Notes (Signed)
Patient passed all extubate criteria from the rapid wean protocol.   Pulmonary Mechanics  NIF: -28 FVC: 1.1L  Audible cuff leak noted.  Saturations are stable at this time

## 2020-10-15 NOTE — Progress Notes (Addendum)
[facility] DAILY ICU PROGRESS NOTE                   [address]            [place] [address]          [phone]   1 Day Post-Op Procedure(s) (LRB): MINIMALLY INVASIVE MITRAL VALVE  REPLACEMENT(MVR) USING MEDTRONIC MOSAIC VALVE SIZE 31MM (Right) TRANSESOPHAGEAL ECHOCARDIOGRAM (TEE) (N/A) CLOSURE OF PATENT FORAMEN OVALE (N/A)  Total Length of Stay:  LOS: 18 days   Subjective: Awake and alert, extubated around midnight.  Complains of pain, says analgesics work "for only a second"/   Objective: Vital signs in last 24 hours: Temp:  [95.9 F (35.5 C)-99.14 F (37.3 C)] 96.62 F (35.9 C) (03/11 9323) Pulse Rate:  [79-105] 102 (03/11 0632) Cardiac Rhythm: Atrial paced (03/10 2000) Resp:  [12-33] 28 (03/11 0632) BP: (88-152)/(63-134) 135/93 (03/11 0600) SpO2:  [93 %-100 %] 94 % (03/11 5573) Arterial Line BP: (97-189)/(60-98) 170/77 (03/11 0632) FiO2 (%):  [40 %-50 %] 40 % (03/10 2343) Weight:  [83.4 kg] 83.4 kg (03/11 0632)  Filed Weights   10/13/20 1820 10/14/20 0430 10/15/20 2202  Weight: 74.6 kg 73.7 kg 83.4 kg    Weight change: 6 kg   Hemodynamic parameters for last 24 hours: PAP: (28-60)/(8-39) 32/16 CO:  [4.4 L/min-8.9 L/min] 8.9 L/min CI:  [2.2 L/min/m2-4.6 L/min/m2] 4.6 L/min/m2  Intake/Output from previous day: 03/10 0701 - 03/11 0700 In: 6332.6 [P.O.:480; I.V.:3613.3; Blood:899; NG/GT:30; IV Piggyback:1310.3] Out: 2180 [Blood:1250; Chest Tube:930]  Intake/Output this shift: No intake/output data recorded.  Current Meds: Scheduled Meds: . sodium chloride   Intravenous Once  . acetaminophen  1,000 mg Oral Q6H  . aspirin EC  325 mg Oral Daily  . bisacodyl  10 mg Oral Daily   Or  . bisacodyl  10 mg Rectal Daily  . chlorhexidine gluconate (MEDLINE KIT)  15 mL Mouth Rinse BID  . Chlorhexidine Gluconate Cloth  6 each Topical Daily  . docusate  200 mg Per Tube Daily  . influenza vac split quadrivalent PF  0.5 mL Intramuscular Tomorrow-1000  .  insulin aspart  0-24 Units Subcutaneous Q4H  . [START ON 10/16/2020] pantoprazole sodium  40 mg Per Tube Daily  . sodium chloride flush  3 mL Intravenous Q12H  . sodium zirconium cyclosilicate  10 g Per Tube BID   Continuous Infusions: . sodium chloride Stopped (10/15/20 0642)  . sodium chloride    . sodium chloride Stopped (10/14/20 2228)  . cefUROXime (ZINACEF)  IV Stopped (10/14/20 1804)  . famotidine (PEPCID) IV Stopped (10/14/20 1529)  . nitroGLYCERIN 100 mcg/min (10/15/20 0700)   PRN Meds:.sodium chloride, metoprolol tartrate, morphine injection, ondansetron (ZOFRAN) IV, oxyCODONE, sodium chloride flush, traMADol  General appearance: alert, cooperative and moderate distress Neurologic: intact Heart: SR with 1st degree AV block. Pacer is off. Lungs: Breath sounds are full and equal.CT drainage moderate, 473m past 12 hours. Abdomen: soft, NT. Extremities: All warm and well perfused. Left femoral cannulation sites are soft and dry.  Wound: The right chest incision is covered with an Aquacel dressing.   Lab Results: CBC: Recent Labs    10/14/20 2022 10/14/20 2354 10/15/20 0406 10/15/20 0622  WBC 13.9*  --  14.2*  --   HGB 9.2*   < > 9.4* 9.5*  HCT 28.0*   < > 29.7* 28.0*  PLT 122*  --  140*  --    < > = values in this interval  not displayed.   BMET:  Recent Labs    10/14/20 2022 10/14/20 2354 10/15/20 0406 10/15/20 0622  NA 135   < > 134* 136  K 5.7*   < > 5.0 5.0  CL 104  --  101  --   CO2 19*  --  18*  --   GLUCOSE 95  --  122*  --   BUN 26*  --  30*  --   CREATININE 5.45*  --  6.07*  --   CALCIUM 7.7*  --  7.9*  --    < > = values in this interval not displayed.    CMET: Lab Results  Component Value Date   WBC 14.2 (H) 10/15/2020   HGB 9.5 (L) 10/15/2020   HCT 28.0 (L) 10/15/2020   PLT 140 (L) 10/15/2020   GLUCOSE 122 (H) 10/15/2020   CHOL 126 09/29/2020   TRIG 342 (H) 09/29/2020   HDL <10 (L) 09/29/2020   LDLDIRECT 25.4 09/30/2020   LDLCALC NOT  CALCULATED 09/29/2020   ALT 10 10/14/2020   AST 35 10/14/2020   NA 136 10/15/2020   K 5.0 10/15/2020   CL 101 10/15/2020   CREATININE 6.07 (H) 10/15/2020   BUN 30 (H) 10/15/2020   CO2 18 (L) 10/15/2020   TSH 2.006 08/28/2013   INR 1.5 (H) 10/14/2020   HGBA1C 5.5 09/29/2020      PT/INR:  Recent Labs    10/14/20 1404  LABPROT 17.7*  INR 1.5*   Radiology: [facility] Chest Port 1 View  Result Date: 10/14/2020 CLINICAL DATA:  Hypoxia.  Status post mitral valve replacement EXAM: PORTABLE CHEST 1 VIEW COMPARISON:  October 13, 2020 FINDINGS: Endotracheal tube tip is 4.0 cm above the carina. Swan-Ganz catheter tip is in the proximal right main pulmonary artery. Nasogastric tube tip and side port are below the diaphragm. There is a right chest tube. Temporary pacemaker wires are attached to the right heart. No pneumothorax. There is mild atelectasis in the lung bases. Lungs elsewhere are clear. There is cardiomegaly. Status post mitral valve replacement. Stent noted in the left subclavian region. 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 IMPRESSION: Tube and catheter positions as described without evident pneumothorax. Bibasilar atelectasis. Cardiomegaly. Status post mitral valve replacement. 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 Electronically Signed   By: Lowella Grip III M.D.   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 Hemodynamics stable, currently hypertensive and on NTG infusion. Monitoring lines are our Currently on RA with adequate sats. Leave chest tubes for drainage.  Mobilize with PT.  Continue Vanc through 11/14/20 per ID.   -ESRD / Volume excess / HTN- Wt is 10kg+ since pre-op. K+5.7->5.0,  Nephrology considering HD today. Expect BP will moderate some as  excess volume is removed. Hold off on starting oral agents for now.   -Expected acute blood loss anemia on anemia of CKD- 4 units PRBC's per-op. Minimal ongoing losses. Last Hct 28%, monitor.   -History of Hepatitis C with cirrhosis- ID planning outpatient follow up.   -Multiple dental caries and periodontitis- s/p multiple extractions on 3/8.  -DVT prophylaxis- SCD's in place, encouraging mobility with PT. He was reluctant to  participate with therapy prior to surgery.   -Endo- no h/o DM. A1C on admission 5.5. Glucose well controlled since surgery.     Antony Odea, PA-C [phone] 10/15/2020 7:31 AM  ADDENDUM 09:07  ID now recommending IV Vanc for 6 wks from day of surgery (->11/25/20).   I have seen and examined the patient and agree with the assessment and plan as outlined.  Overall doing well POD1.  Maintaining NSR w/ stable hemodynamics although hypertensive and volume-overloaded, complaining of pruritis in need of dialysis.   For HD today per Nephrology team.  Mobilize.  Restart metoprolol at reduced dose.  Add clonidine patch and transition back to oral clonidine in 3-4 days as oral intake improves.  Continue to hold amlodipine and scheduled doses of hydralazine for now.  Add both labetalol and hydralazine to be used as needed.  Continue Vanc x 6 weeks and follow up cultures from OR.  Mobilize.   Rexene Alberts, MD 10/15/2020 9:13 AM

## 2020-10-15 NOTE — Procedures (Signed)
Extubation Procedure Note  Patient Details:   Name: Patrick Anthony DOB: [birth year] MRN: [id number]   Airway Documentation:  Airway 8 mm (Active)  Secured at (cm) 25 cm 10/14/20 2313  Measured From Lips 10/14/20 2313  Secured Location Right 10/14/20 2313  Secured By Pink Tape 10/14/20 2313  Prone position No 10/14/20 1410  Site Condition Dry 10/14/20 2313   Vent end date: 10/15/20 Vent end time: 0005   Evaluation  O2 sats: stable throughout Complications: No apparent complications Patient did tolerate procedure well. Bilateral Breath Sounds: Clear,Diminished   Yes: patient able to speak post extubation.  Patient was extubated to a 3L [place] with bubble humidity provided. Patient tolerated extubation well. No complications noted. Patient able to speak post extubation.     Leigh Aurora, BS, RRT, RCP 10/15/2020, 12:10 AM

## 2020-10-15 NOTE — Progress Notes (Signed)
[facility] for Infectious Disease    Date of Admission:  09/27/2020   Total days of antibiotics 5           ID: Patrick Anthony is a 50 y.o. 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 Principal Problem:   Bacterial endocarditis Active Problems:   Chronic combined systolic and diastolic congestive heart failure (HCC)   HTN (hypertension)   Anemia in chronic kidney disease   Hepatitis C   ESRD on dialysis (HCC)   End stage renal disease (HCC)   Chest pain   Sepsis ([place])   Pneumonia due to infectious agent   ADPKD (autosomal dominant polycystic kidney disease)   Sepsis due to pneumonia (HCC)   Lactic acidosis   Cerebral embolism with cerebral infarction   Bacteremia due to Staphylococcus aureus   Mitral regurgitation   Uncontrolled hypertension   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 sodium chloride   Intravenous Once  . acetaminophen  1,000 mg Oral Q6H  . aspirin EC  325 mg Oral Daily  . bisacodyl  10 mg Oral Daily   Or  . bisacodyl  10 mg Rectal Daily  . chlorhexidine gluconate (MEDLINE KIT)  15 mL Mouth Rinse BID  . Chlorhexidine Gluconate Cloth  6 each Topical Daily  . docusate  200 mg Per Tube Daily  . influenza vac split quadrivalent PF  0.5 mL Intramuscular Tomorrow-1000  . insulin aspart  0-24 Units Subcutaneous Q4H  . [START ON 10/16/2020] pantoprazole sodium  40 mg Per Tube Daily  . sodium chloride flush  3 mL Intravenous Q12H  . sodium zirconium  cyclosilicate  10 g Per Tube BID    Objective: Vital signs in last 24 hours: Temp:  [95.9 F (35.5 C)-99.14 F (37.3 C)] 96.62 F (35.9 C) (03/11 0258) Pulse Rate:  [79-106] 101 (03/11 0845) Resp:  [12-33] 26 (03/11 0845) BP: (88-152)/(63-134) 144/101 (03/11 0845) SpO2:  [93 %-100 %] 98 % (03/11 0845) Arterial Line BP: (97-189)/(60-98) 159/82 (03/11 0845) FiO2 (%):  [40 %-50 %] 40 % (03/10 2343) Weight:  [83.4 kg] 83.4 kg (03/11 5277)  Physical Exam  Nitroglycerine gtt running (hypertensive postop)  Constitutional: mild distress complaining of pain at mediastinal drain site, conversant, sitting in chair HEENT: conj clear; normocephalic; oropharynx clear Cardiovascular: rrr no mrg; mediastinal drain with serosanguinous output; chest incision dressing clean/dry Pulmonary/Chest: Effort normal; cta Abdominal: Soft/nt Neurological: nonfocal Psych alert/oriented, mood apprpriate Skin no rash; left ue AVG site good thrill without swelling/fluctuance/tenderness  Left IJ central line  Lab Results Recent Labs    10/14/20 2022 10/14/20 2354 10/15/20 0406 10/15/20 0622  WBC 13.9*  --  14.2*  --   HGB 9.2*   < > 9.4* 9.5*  HCT 28.0*   < > 29.7* 28.0*  NA 135   < > 134* 136  K 5.7*   < > 5.0 5.0  CL 104  --  101  --   CO2 19*  --  18*  --  BUN 26*  --  30*  --   CREATININE 5.45*  --  6.07*  --    < > = values in this interval not displayed.   Liver Panel Recent Labs    10/13/20 0145 10/14/20 0406  PROT 8.1 8.7*  ALBUMIN 2.3* 2.4*  AST 27 35  ALT 12 10  ALKPHOS 52 46  BILITOT 1.0 0.8    Microbiology: 2/24 blood cx NGTD 2/21 blood cx MRSA   Studies/Results: 2/22 brain mri 1. 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 Multiple vascular territories are involved and findings are highly suspicious for an embolic process. 2. Foci of chronic hemorrhage within  the bilateral cerebellar hemispheres and cerebellar vermis. A few scattered supratentorial chronic microhemorrhages are also present. 3. Mild generalized atrophy of the brain  2/22 tte 1. Left ventricular ejection fraction, by estimation, is 55 to 60%. The  left ventricle has normal function. The left ventricle has no regional  wall motion abnormalities. There is moderate concentric left ventricular  hypertrophy. Left ventricular  diastolic parameters are consistent with Grade II diastolic dysfunction  (pseudonormalization).  2. Right ventricular systolic function is normal. The right ventricular  size is normal. There is normal pulmonary artery systolic pressure.  3. Left atrial size was moderately dilated.  4. Right atrial size was mild to moderately dilated.  5. The mitral valve is normal in structure. Mild to moderate mitral valve  regurgitation. No evidence of mitral stenosis. Moderate to severe mitral  annular calcification.  6. The aortic valve is tricuspid. Aortic valve regurgitation is trivial.  No aortic stenosis is present.  7. The inferior vena cava is normal in size with greater than 50%  respiratory variability, suggesting right atrial pressure of 3 mmHg.  8. Agitated saline contrast bubble study was negative, with no evidence  of any interatrial shunt.  2/23 chest abd pelv ct 1. Cavitary mass in the left upper lobe apex measuring up to 2.2 cm with surrounding ground-glass density. Findings could be secondary to cavitary infection or potential septic embolus. A cavitary neoplasm is also in the differential. There are additional small foci of consolidation and ground-glass density in the right upper lobe and small ground-glass nodule in left upper lobe, potentially infectious or inflammatory. Short interval chest CT follow-up is recommended. 2. Polycystic kidney disease. 3. No CT evidence for acute intra-abdominal or pelvic abnormality. 4. Bulky calcified  mass anterior to right shoulder with smaller left paraspinal soft tissue calcification probably due to calcinosis related to chronic kidney disease  2/24 mr lumbar thoracic 1. No thoracolumbar discitis-osteomyelitis or epidural collection. 2. Mild bone marrow edema and disc space edema at C6-C7, likely degenerative. 3. Mild L5-S1 degenerative disc disease without spinal canal or neural foraminal stenosis  3/11 cxr FINDINGS: Endotracheal tube and nasogastric tube have been removed. Swan-Ganz catheter tip is in the main pulmonary outflow tract. There is a right chest tube. Temporary pacemaker wires are attached to the right heart. There is no appreciable pneumothorax. There is a small pleural effusion on each side with bibasilar atelectasis. There is also atelectasis in the right upper lobe. There is cardiomegaly with pulmonary vascularity normal. No adenopathy. Patient is status post mitral valve replacement. Stent noted in left subclavian region. No bone lesions.  IMPRESSION: Tube and catheter positions as described without pneumothorax. There are new small pleural effusions bilaterally with atelectatic change in each lung base and right upper lobe. Stable cardiomegaly.  Assessment/Plan: Abx: 2/21-c vanc  Lines: 3/10-c left ij cvc   50yo M with ESRD dialyzed via lue gore-tex avg (placed 10/2019), hep c (no prior tx) admitted 2/21 with 10 day malaise, fever, found to have sepsis due to MRSA bacteremia c/b embolic stroke presumably from endocarditis  Complications: CNS septic emboli, pulmonary cavitary lesions/septic emboli. Has lower back pain but negative mri lumbar thoracic  Doesn't appear AVG clinically involved with mrsa bsi at this time. But if relapse after tx, should investigate further  2/21 & 2/24 bcx mrsa 2/27 bcx negative  3/01 tee showed LAA thrombus and 1.5 cm MV vegetation without significant MR Echo finding also with patent foramen ovale S/p multiple  teeth extraction 3/08 S/p medtronic mosaic bioprosthetic mvr 3/10 and closure of patent foramen ovale  Chronic hep c -- will f/u outpatient for treatment; genotype ordered here  ----- 3/11 assessment 3/09 has fever and blood cx sent ngtd  Infected valve tissue sent for cx I reviewed the operative note -- bulky purulent vegetation found. Given recent fever 3/09, operative finding, and vancomycin poor tissue penetration, would do 6 weeks abx from 3/10    -continue vancomycin -duration vancomycin 6 weeks start date reset to 3/10 to 4/21 -patient to get vancomycin tiw with dialysis along with weekly cbc, cmp, crp -f/u final blood culture -id clinic f/u as below  Fax weekly labs to [phone]  Clinic Follow Up Appt: 3/24 @ 330 with dr Gale Journey  @  [facility] [address], [place], [place] [address] Phone: [phone]     [facility] for Infectious Diseases  I spent more than 30 minute reviewing data/chart, and coordinating care and >50% direct face to face time providing counseling/discussing diagnostics/treatment plan with patient   10/15/2020, 8:51 AM

## 2020-10-15 NOTE — Progress Notes (Signed)
Patient ID: Patrick Anthony, male   DOB: Feb 13, 1971, 50 y.o.   MRN: [id number] [facility] Evening Rounds:  Hemodynamically stable on low dose NTG for HTN Sinus tachy 103 sats 95% on 1 L HFNC  Having a lot of pain and itching.  CT output low.  CBC    Component Value Date/Time   WBC 16.0 (H) 10/15/2020 1641   RBC 3.57 (L) 10/15/2020 1641   HGB 9.9 (L) 10/15/2020 1641   HCT 31.2 (L) 10/15/2020 1641   PLT 167 10/15/2020 1641   MCV 87.4 10/15/2020 1641   MCH 27.7 10/15/2020 1641   MCHC 31.7 10/15/2020 1641   RDW 16.4 (H) 10/15/2020 1641   LYMPHSABS 1.4 10/05/2020 0431   MONOABS 2.1 (H) 10/05/2020 0431   EOSABS 0.1 10/05/2020 0431   BASOSABS 0.1 10/05/2020 0431   BMET    Component Value Date/Time   NA 134 (L) 10/15/2020 1641   K 3.7 10/15/2020 1641   CL 98 10/15/2020 1641   CO2 24 10/15/2020 1641   GLUCOSE 125 (H) 10/15/2020 1641   BUN 12 10/15/2020 1641   CREATININE 3.34 (H) 10/15/2020 1641   CALCIUM 8.4 (L) 10/15/2020 1641   GFRNONAA 22 (L) 10/15/2020 1641   GFRAA 5 (L) 10/31/2019 [id number]

## 2020-10-15 NOTE — Progress Notes (Signed)
[facility] Progress Note   Subjective: seen in room, pt up in chair, c/o chest pain.  BP's high, on IV ntg drip.  No SOB.  Was extubated just before MN last night per RN.  Objective Vitals:   10/15/20 1300 10/15/20 1310 10/15/20 1320 10/15/20 1330  BP: (!) 134/97 (!) 149/96 (!) 143/99 (!) 151/100  Pulse: 93 95 89 95  Resp: 19 20 18  (!) 25  Temp:      TempSrc:      SpO2: 100% 100% 100% 100%  Weight:      Height:       Physical Exam Gen: alert, not in distress, uncomfortable post op pain HEENT: + periorbital edema.  Heart:S1,S2 now with mitral click.A Paced on montitor.  Lungs:Bilateral breath sounds, decreased in bases, otherwise CTAB. L Chest tubes X 2   Abdomen:OG in place. No BS.  Ext: +1-2 bilat LE edema. Dialysis Access:L AVG + Bruit   OP HD:[facility]WF [facility] 4h 350/500 82.5kg 2/2.5 bath P1 LUA AVG -Heparin 5000 units IV TIW -Aranesp 47mgIVq 2wks - last 09/27/20 (Verified order) -Hectorol337m IV qHD   Assessment/Plan: 1. SP min invasive MVR replacement - done 3/10. Doing well postop, extubated and up in chair. BP's high, up 8- 10kg post op as expected.  2. 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 ID following on Vancomycin. LAVG w/oovertsigns of infection. S/P tooth extraction3/8/22.Per Primary. 3. Hyperkalemia- lower w HD today 4. ESRD - usual HD MWF.Plan HD today on schedule, get volume down. Consider additional HD tomorrow for volume control if needed.  5. Hypertension/volume- H/O resistant hypertension and medical noncompliance. New dry wt here around 73-76kg. BP's up on IV ntg. Volume correction w/ HD today.  6. Anemiaof CKD-HGB 9.2. S/P 2 units of PRBCs 3/10. Hb 9- 10 range.  7. Secondary Hyperparathyroidism -resume sensipar, binders when eating. Continue VDRA  8. Hx Hep C /liver cirrhosis.    RoKelly SplinterMD 10/15/2020, 1:40 PM             Labs: Basic  Metabolic Panel: Recent Labs  Lab 10/11/20 1203 10/12/20 1226 10/14/20 0406 10/14/20 0818 10/14/20 1254 10/14/20 1257 10/14/20 2022 10/14/20 2354 10/15/20 0405 10/15/20 0406 10/15/20 0622  NA 129*   < > 132*   < > 136   < > 135   < > 138 134* 136  K 5.0   < > 4.6   < > 5.8*   < > 5.7*   < > 5.0 5.0 5.0  CL 90*   < > 94*   < > 101  --  104  --   --  101  --   CO2 23   < > 28  --   --   --  19*  --   --  18*  --   GLUCOSE 109*   < > 87   < > 143*  --  95  --   --  122*  --   BUN 56*   < > 19   < > 22*  --  26*  --   --  30*  --   CREATININE 11.02*   < > 5.60*   < > 5.10*  --  5.45*  --   --  6.07*  --   CALCIUM 8.1*   < > 9.1  --   --   --  7.7*  --   --  7.9*  --  PHOS 7.8*  --  5.8*  --   --   --   --   --   --   --   --    < > = values in this interval not displayed.   Liver Function Tests: Recent Labs  Lab 10/11/20 1203 10/13/20 0145 10/14/20 0406  AST  --  27 35  ALT  --  12 10  ALKPHOS  --  52 46  BILITOT  --  1.0 0.8  PROT  --  8.1 8.7*  ALBUMIN 2.1* 2.3* 2.4*   No results for input(s): LIPASE, AMYLASE in the last 168 hours. CBC: Recent Labs  Lab 10/13/20 0145 10/14/20 0406 10/14/20 0818 10/14/20 1404 10/14/20 1433 10/14/20 2022 10/14/20 2354 10/15/20 0405 10/15/20 0406 10/15/20 0622  WBC 12.1* 10.2  --  17.6*  --  13.9*  --   --  14.2*  --   HGB 7.5* 7.9*   < > 9.2*   < > 9.2*   < > 10.2* 9.4* 9.5*  HCT 24.1* 25.1*   < > 29.3*   < > 28.0*   < > 30.0* 29.7* 28.0*  MCV 86.1 85.7  --  89.1  --  87.0  --   --  88.9  --   PLT 251 231   < > 121*  --  122*  --   --  140*  --    < > = values in this interval not displayed.   Medications: . sodium chloride Stopped (10/15/20 1834)  . sodium chloride    . sodium chloride Stopped (10/14/20 2228)  . sodium chloride    . sodium chloride    . cefUROXime (ZINACEF)  IV Stopped (10/14/20 1804)  . nitroGLYCERIN 70 mcg/min (10/15/20 1100)   . sodium chloride   Intravenous Once  . acetaminophen  1,000 mg Oral  Q6H  . aspirin EC  325 mg Oral Daily  . bisacodyl  10 mg Oral Daily   Or  . bisacodyl  10 mg Rectal Daily  . calcium acetate  667 mg Oral TID WC  . chlorhexidine gluconate (MEDLINE KIT)  15 mL Mouth Rinse BID  . Chlorhexidine Gluconate Cloth  6 each Topical Daily  . [START ON 10/16/2020] cinacalcet  30 mg Oral Q breakfast  . cloNIDine  0.3 mg Transdermal Weekly  . docusate sodium  200 mg Oral Daily  . gabapentin  200 mg Oral QHS  . influenza vac split quadrivalent PF  0.5 mL Intramuscular Tomorrow-1000  . insulin aspart  0-24 Units Subcutaneous Q4H  . metoprolol tartrate  25 mg Oral BID  . [START ON 10/16/2020] pantoprazole sodium  40 mg Per Tube Daily  . sodium chloride flush  3 mL Intravenous Q12H  . sodium zirconium cyclosilicate  10 g Per Tube BID

## 2020-10-15 NOTE — Plan of Care (Signed)
  Problem: Education: Goal: Knowledge of disease or condition will improve Outcome: Progressing Goal: Knowledge of secondary prevention will improve Outcome: Progressing Goal: Knowledge of patient specific risk factors addressed and post discharge goals established will improve Outcome: Progressing Goal: Individualized Educational Video(s) Outcome: Progressing   Problem: Health Behavior/Discharge Planning: Goal: Ability to manage health-related needs will improve Outcome: Progressing   Problem: Self-Care: Goal: Ability to participate in self-care as condition permits will improve Outcome: Progressing Goal: Ability to communicate needs accurately will improve Outcome: Progressing   Problem: Nutrition: Goal: Risk of aspiration will decrease Outcome: Progressing Goal: Dietary intake will improve Outcome: Progressing   Problem: Ischemic Stroke/TIA Tissue Perfusion: Goal: Complications of ischemic stroke/TIA will be minimized Outcome: Progressing   Problem: Education: Goal: Understanding of CV disease, CV risk reduction, and recovery process will improve Outcome: Progressing Goal: Individualized Educational Video(s) Outcome: Progressing   Problem: Activity: Goal: Ability to return to baseline activity level will improve Outcome: Progressing   Problem: Cardiovascular: Goal: Ability to achieve and maintain adequate cardiovascular perfusion will improve Outcome: Progressing Goal: Vascular access site(s) Level 0-1 will be maintained Outcome: Progressing   Problem: Health Behavior/Discharge Planning: Goal: Ability to safely manage health-related needs after discharge will improve Outcome: Progressing   Problem: Education: Goal: Knowledge of General Education information will improve Description: Including pain rating scale, medication(s)/side effects and non-pharmacologic comfort measures Outcome: Progressing   Problem: Health Behavior/Discharge Planning: Goal: Ability  to manage health-related needs will improve Outcome: Progressing

## 2020-10-16 ENCOUNTER — Inpatient Hospital Stay (HOSPITAL_COMMUNITY): Payer: Medicaid Other

## 2020-10-16 DIAGNOSIS — J90 Pleural effusion, not elsewhere classified: Secondary | ICD-10-CM | POA: Diagnosis not present

## 2020-10-16 DIAGNOSIS — N186 End stage renal disease: Secondary | ICD-10-CM | POA: Diagnosis not present

## 2020-10-16 DIAGNOSIS — R0902 Hypoxemia: Secondary | ICD-10-CM | POA: Diagnosis not present

## 2020-10-16 DIAGNOSIS — A419 Sepsis, unspecified organism: Secondary | ICD-10-CM | POA: Diagnosis not present

## 2020-10-16 DIAGNOSIS — I132 Hypertensive heart and chronic kidney disease with heart failure and with stage 5 chronic kidney disease, or end stage renal disease: Secondary | ICD-10-CM | POA: Diagnosis not present

## 2020-10-16 DIAGNOSIS — J189 Pneumonia, unspecified organism: Secondary | ICD-10-CM | POA: Diagnosis not present

## 2020-10-16 DIAGNOSIS — R0602 Shortness of breath: Secondary | ICD-10-CM | POA: Diagnosis not present

## 2020-10-16 DIAGNOSIS — N2581 Secondary hyperparathyroidism of renal origin: Secondary | ICD-10-CM | POA: Diagnosis not present

## 2020-10-16 DIAGNOSIS — R531 Weakness: Secondary | ICD-10-CM | POA: Diagnosis not present

## 2020-10-16 DIAGNOSIS — I639 Cerebral infarction, unspecified: Secondary | ICD-10-CM | POA: Diagnosis not present

## 2020-10-16 DIAGNOSIS — E875 Hyperkalemia: Secondary | ICD-10-CM | POA: Diagnosis not present

## 2020-10-16 DIAGNOSIS — Z992 Dependence on renal dialysis: Secondary | ICD-10-CM | POA: Diagnosis not present

## 2020-10-16 DIAGNOSIS — D631 Anemia in chronic kidney disease: Secondary | ICD-10-CM | POA: Diagnosis not present

## 2020-10-16 DIAGNOSIS — I517 Cardiomegaly: Secondary | ICD-10-CM | POA: Diagnosis not present

## 2020-10-16 DIAGNOSIS — I5032 Chronic diastolic (congestive) heart failure: Secondary | ICD-10-CM | POA: Diagnosis not present

## 2020-10-16 DIAGNOSIS — J9811 Atelectasis: Secondary | ICD-10-CM | POA: Diagnosis not present

## 2020-10-16 LAB — BASIC METABOLIC PANEL
Anion gap: 11 (ref 5–15)
BUN: 17 mg/dL (ref 6–20)
CO2: 26 mmol/L (ref 22–32)
Calcium: 8.8 mg/dL — ABNORMAL LOW (ref 8.9–10.3)
Chloride: 97 mmol/L — ABNORMAL LOW (ref 98–111)
Creatinine, Ser: 4.46 mg/dL — ABNORMAL HIGH (ref 0.61–1.24)
GFR, Estimated: 15 mL/min — ABNORMAL LOW (ref >60)
Glucose, Bld: 99 mg/dL (ref 70–99)
Potassium: 4.1 mmol/L (ref 3.5–5.1)
Sodium: 134 mmol/L — ABNORMAL LOW (ref 135–145)

## 2020-10-16 LAB — CBC
HCT: 29.4 % — ABNORMAL LOW (ref 39.0–52.0)
Hemoglobin: 9.6 g/dL — ABNORMAL LOW (ref 13.0–17.0)
MCH: 28.7 pg (ref 26.0–34.0)
MCHC: 32.7 g/dL (ref 30.0–36.0)
MCV: 88 fL (ref 80.0–100.0)
Platelets: 160 10*3/uL (ref 150–400)
RBC: 3.34 MIL/uL — ABNORMAL LOW (ref 4.22–5.81)
RDW: 16.5 % — ABNORMAL HIGH (ref 11.5–15.5)
WBC: 15.5 10*3/uL — ABNORMAL HIGH (ref 4.0–10.5)
nRBC: 0.1 % (ref 0.0–0.2)

## 2020-10-16 LAB — GLUCOSE, CAPILLARY
Glucose-Capillary: 131 mg/dL — ABNORMAL HIGH (ref 70–99)
Glucose-Capillary: 105 mg/dL — ABNORMAL HIGH (ref 70–99)
Glucose-Capillary: 82 mg/dL (ref 70–99)
Glucose-Capillary: 101 mg/dL — ABNORMAL HIGH (ref 70–99)
Glucose-Capillary: 105 mg/dL — ABNORMAL HIGH (ref 70–99)
Glucose-Capillary: 111 mg/dL — ABNORMAL HIGH (ref 70–99)

## 2020-10-16 LAB — VANCOMYCIN, RANDOM: Vancomycin Rm: 29

## 2020-10-16 IMAGING — DX DG CHEST 1V PORT
1 series · 1 of 1 positions shown · non-contrast
Comparison: [DATE]

CLINICAL DATA: Mitral valve replacement

EXAM:
PORTABLE CHEST 1 VIEW

[chest]
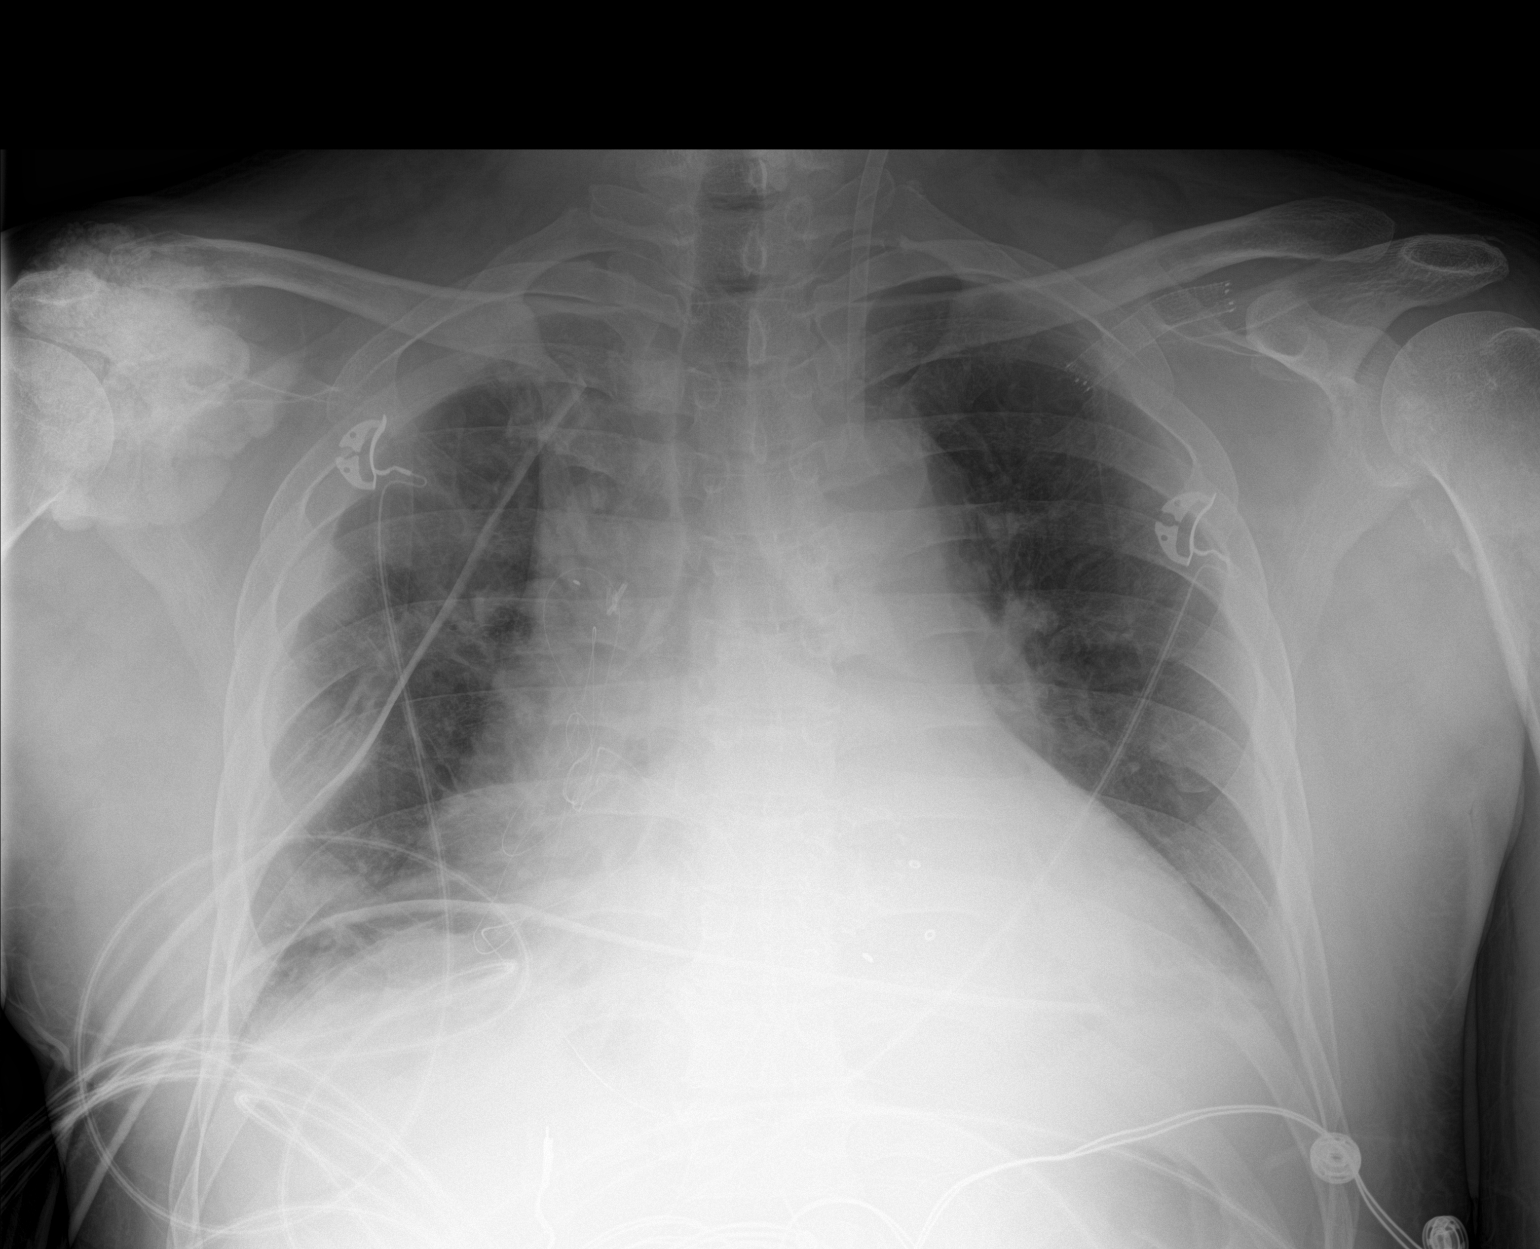

[1 of 1 positions shown; findings below may reference images not displayed]

FINDINGS: Swan-Ganz catheter has been removed. Cordis tip is in the left
innominate vein. There is a chest tube on the right. Temporary
pacemaker wires are attached to the right heart. There is an
apparent pericardial drain. No pneumothorax. Small pleural effusions
remain with questionable loculation laterally on the right. There is
bibasilar atelectasis. No new opacity. There is cardiomegaly.
Pulmonary vascularity is within normal limits. No adenopathy
appreciable. Status post mitral valve replacement.

Stent noted in the left subclavian region. There is apparent tumoral
calcinosis in the right shoulder region.
IMPRESSION: Tube and catheter positions as described without evident
pneumothorax. Small pleural effusions bilaterally with bibasilar
atelectasis. Stable cardiomegaly. Tumoral calcinosis noted in right
shoulder region.

## 2020-10-16 MED ORDER — INSULIN ASPART 100 UNIT/ML ~~LOC~~ SOLN: 0.0000 [IU] | Freq: Three times a day (TID) | SUBCUTANEOUS | Status: DC

## 2020-10-16 MED ORDER — OXYCODONE HCL 5 MG PO TABS
5.0000 mg | ORAL_TABLET | ORAL | Status: DC | PRN
  Administered 2020-10-17: 5 mg via ORAL
  Filled 2020-10-16: qty 1

## 2020-10-16 MED ORDER — PANTOPRAZOLE SODIUM 40 MG PO TBEC
40.0000 mg | DELAYED_RELEASE_TABLET | Freq: Every day | ORAL | Status: DC
  Administered 2020-10-16 – 2020-11-10 (×24): 40 mg via ORAL
  Filled 2020-10-16 (×26): qty 1

## 2020-10-16 MED ORDER — VANCOMYCIN HCL 500 MG/100ML IV SOLN
500.0000 mg | INTRAVENOUS | Status: DC
  Filled 2020-10-16: qty 100

## 2020-10-16 MED ORDER — CAMPHOR-MENTHOL 0.5-0.5 % EX LOTN
TOPICAL_LOTION | CUTANEOUS | Status: DC | PRN
  Filled 2020-10-16: qty 222

## 2020-10-16 MED ORDER — TRAMADOL HCL 50 MG PO TABS
50.0000 mg | ORAL_TABLET | ORAL | Status: DC | PRN
  Administered 2020-10-17 (×2): 100 mg via ORAL
  Filled 2020-10-16 (×2): qty 2

## 2020-10-16 NOTE — Progress Notes (Signed)
2 Days Post-Op Procedure(s) (LRB): MINIMALLY INVASIVE MITRAL VALVE  REPLACEMENT(MVR) USING MEDTRONIC MOSAIC VALVE SIZE 31MM (Right) TRANSESOPHAGEAL ECHOCARDIOGRAM (TEE) (N/A) CLOSURE OF PATENT FORAMEN OVALE (N/A) Subjective: Only complaint is of right chest wall pain.  Objective: Vital signs in last 24 hours: Temp:  [97.6 F (36.4 C)-98.5 F (36.9 C)] 97.9 F (36.6 C) (03/12 0700) Pulse Rate:  [82-125] 85 (03/12 0800) Cardiac Rhythm: Normal sinus rhythm (03/12 0800) Resp:  [15-33] 17 (03/12 0800) BP: (107-170)/(34-107) 126/100 (03/12 0800) SpO2:  [90 %-100 %] 93 % (03/12 0800) Weight:  [80.7 kg] 80.7 kg (03/12 0600)  Hemodynamic parameters for last 24 hours:    Intake/Output from previous day: 03/11 0701 - 03/12 0700 In: 905.8 [P.O.:240; I.V.:178.8; NG/GT:237; IV Piggyback:250] Out: 4330 [Chest Tube:330] Intake/Output this shift: Total I/O In: 120 [P.O.:120] Out: 20 [Chest Tube:20]  General appearance: alert and cooperative Neurologic: intact Heart: regular rate and rhythm, S1, S2 normal, no murmur Lungs: clear to auscultation bilaterally Extremities: edema mild Wound: dressing dry  Lab Results: Recent Labs    10/15/20 1641 10/16/20 0324  WBC 16.0* 15.5*  HGB 9.9* 9.6*  HCT 31.2* 29.4*  PLT 167 160   BMET:  Recent Labs    10/15/20 1641 10/16/20 0324  NA 134* 134*  K 3.7 4.1  CL 98 97*  CO2 24 26  GLUCOSE 125* 99  BUN 12 17  CREATININE 3.34* 4.46*  CALCIUM 8.4* 8.8*    PT/INR:  Recent Labs    10/14/20 1404  LABPROT 17.7*  INR 1.5*   ABG    Component Value Date/Time   PHART 7.393 10/15/2020 0622   HCO3 20.6 10/15/2020 0622   TCO2 22 10/15/2020 0622   ACIDBASEDEF 4.0 (H) 10/15/2020 0622   O2SAT 95.0 10/15/2020 0622   CBG (last 3)  Recent Labs    10/15/20 2331 10/16/20 0326 10/16/20 0807  GLUCAP 72 105* 82   CXR ok  Assessment/Plan: S/P Procedure(s) (LRB): MINIMALLY INVASIVE MITRAL VALVE  REPLACEMENT(MVR) USING MEDTRONIC MOSAIC  VALVE SIZE 31MM (Right) TRANSESOPHAGEAL ECHOCARDIOGRAM (TEE) (N/A) CLOSURE OF PATENT FORAMEN OVALE (N/A)  POD 2  Hemodynamically stable in sinus rhythm. Titrate up antihypertensives as needed after dialyzed again today.  Chest tube output low. Will probably remove after dialysis later today or in am.  ESRD: HD again today for volume excess  Continue vanc for MRSA endocarditis. Should be able to remove central line later today after HD.  IS, ambulation. PT consulted.  Not planning Coumadin in this pt per [facility].   LOS: 19 days    Patrick Anthony 10/16/2020

## 2020-10-16 NOTE — Progress Notes (Signed)
[facility] Progress Note   Subjective: seen in room, 4 L off yest w/ HD. Looks more comfortable. CT still in. No c/o's.   Objective Vitals:   10/16/20 0800 10/16/20 0900 10/16/20 1000 10/16/20 1100  BP: (!) 126/100 (!) 131/103 (!) 127/100 (!) 130/106  Pulse: 85     Resp: '17 20 17 '$ (!) 23  Temp:    98.4 F (36.9 C)  TempSrc:      SpO2: 93%     Weight:      Height:       Physical Exam Gen: alert, calm today Heart:S1,S2 now with mitral click  Lungs: clear bilat, no wheezing Abdomen:OG in place. No BS.  Ext: +1-2 bilat LE edema. Dialysis Access:L AVG + Bruit   OP HD:MWF [facility] 4h 350/500 82.5kg 2/2.5 bath P1 LUA AVG -Heparin 5000 units IV TIW -Aranesp 63mgIVq 2wks - last 09/27/20 (Verified order) -Hectorol384m IV qHD   Assessment/Plan: 1. SP min invasive MVR replacement - on 3/10. Doing well.   2. Volume excess- new dry wt here pre-procedure was down around 73-76kg. Post op was up 7-10kg. 4 L removed yest, another 3-4 kg UF planned for today.  3. HTN - cont clonidine patch and metoprolol 25 bid 4. 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 IV Vancomycin. LAVG w/o infection. S/P tooth extraction3/8/22.Per Primary. 5. ESRD - usual HD MWF.Plan HD again today for vol control, then likely Monday.  6. Anemiaof CKD-HGB 9.2. S/P 2 units of PRBCs 3/10. Hb 9- 10 range.  7. Secondary Hyperparathyroidism -resume sensipar, binders when eating. Continue VDRA  8. Hx Hep C /liver cirrhosis.    RoKelly SplinterMD 10/16/2020, 12:44 PM             Labs: Basic Metabolic Panel: Recent Labs  Lab 10/11/20 1203 10/12/20 1226 10/14/20 0406 10/14/20 0818 10/15/20 0406 10/15/20 0622 10/15/20 1641 10/16/20 0324  NA 129*   < > 132*   < > 134* 136 134* 134*  K 5.0   < > 4.6   < > 5.0 5.0 3.7 4.1  CL 90*   < > 94*   < > 101  --  98 97*  CO2 23   < > 28   < > 18*  --  24 26  GLUCOSE  109*   < > 87   < > 122*  --  125* 99  BUN 56*   < > 19   < > 30*  --  12 17  CREATININE 11.02*   < > 5.60*   < > 6.07*  --  3.34* 4.46*  CALCIUM 8.1*   < > 9.1   < > 7.9*  --  8.4* 8.8*  PHOS 7.8*  --  5.8*  --   --   --   --   --    < > = values in this interval not displayed.   Liver Function Tests: Recent Labs  Lab 10/11/20 1203 10/13/20 0145 10/14/20 0406  AST  --  27 35  ALT  --  12 10  ALKPHOS  --  52 46  BILITOT  --  1.0 0.8  PROT  --  8.1 8.7*  ALBUMIN 2.1* 2.3* 2.4*   No results for input(s): LIPASE, AMYLASE in the last 168 hours. CBC: Recent Labs  Lab 10/14/20 1404 10/14/20 1433 10/14/20 2022 10/14/20 2354 10/15/20 0406 10/15/20 0622 10/15/20 1641 10/16/20 0324  WBC 17.6*  --  13.9*  --  14.2*  --  16.0* 15.5*  HGB 9.2*   < > 9.2*   < > 9.4* 9.5* 9.9* 9.6*  HCT 29.3*   < > 28.0*   < > 29.7* 28.0* 31.2* 29.4*  MCV 89.1  --  87.0  --  88.9  --  87.4 88.0  PLT 121*  --  122*  --  140*  --  167 160   < > = values in this interval not displayed.   Medications: . sodium chloride Stopped (10/15/20 [id number])  . sodium chloride    . sodium chloride Stopped (10/14/20 2228)  . sodium chloride    . sodium chloride    . nitroGLYCERIN Stopped (10/15/20 2204)   . acetaminophen  1,000 mg Oral Q6H  . aspirin EC  325 mg Oral Daily  . bisacodyl  10 mg Oral Daily   Or  . bisacodyl  10 mg Rectal Daily  . calcium acetate  667 mg Oral TID WC  . Chlorhexidine Gluconate Cloth  6 each Topical Daily  . cinacalcet  30 mg Oral Q breakfast  . cloNIDine  0.3 mg Transdermal Weekly  . docusate sodium  200 mg Oral Daily  . feeding supplement (NEPRO CARB STEADY)  237 mL Oral BID BM  . gabapentin  200 mg Oral QHS  . influenza vac split quadrivalent PF  0.5 mL Intramuscular Tomorrow-1000  . insulin aspart  0-24 Units Subcutaneous TID WC & HS  . metoprolol tartrate  25 mg Oral BID  . multivitamin  1 tablet Oral QHS  . pantoprazole  40 mg Oral Daily  . sodium chloride flush  3 mL  Intravenous Q12H  . vancomycin  500 mg Intravenous Q M,W,F-HD

## 2020-10-16 NOTE — Progress Notes (Signed)
Patient ID: Patrick Anthony, male   DOB: 1971/05/15, 50 y.o.   MRN: [id number] TCTS Evening Rounds:  Hemodynamically stable today. Ambulated unit  CT output low  On HD now.

## 2020-10-17 ENCOUNTER — Inpatient Hospital Stay (HOSPITAL_COMMUNITY): Payer: Medicaid Other

## 2020-10-17 DIAGNOSIS — D631 Anemia in chronic kidney disease: Secondary | ICD-10-CM | POA: Diagnosis not present

## 2020-10-17 DIAGNOSIS — R531 Weakness: Secondary | ICD-10-CM | POA: Diagnosis not present

## 2020-10-17 DIAGNOSIS — J90 Pleural effusion, not elsewhere classified: Secondary | ICD-10-CM | POA: Diagnosis not present

## 2020-10-17 DIAGNOSIS — I132 Hypertensive heart and chronic kidney disease with heart failure and with stage 5 chronic kidney disease, or end stage renal disease: Secondary | ICD-10-CM | POA: Diagnosis not present

## 2020-10-17 DIAGNOSIS — I639 Cerebral infarction, unspecified: Secondary | ICD-10-CM | POA: Diagnosis not present

## 2020-10-17 DIAGNOSIS — N2581 Secondary hyperparathyroidism of renal origin: Secondary | ICD-10-CM | POA: Diagnosis not present

## 2020-10-17 DIAGNOSIS — Z992 Dependence on renal dialysis: Secondary | ICD-10-CM | POA: Diagnosis not present

## 2020-10-17 DIAGNOSIS — J189 Pneumonia, unspecified organism: Secondary | ICD-10-CM | POA: Diagnosis not present

## 2020-10-17 DIAGNOSIS — I5032 Chronic diastolic (congestive) heart failure: Secondary | ICD-10-CM | POA: Diagnosis not present

## 2020-10-17 DIAGNOSIS — A419 Sepsis, unspecified organism: Secondary | ICD-10-CM | POA: Diagnosis not present

## 2020-10-17 DIAGNOSIS — J9811 Atelectasis: Secondary | ICD-10-CM | POA: Diagnosis not present

## 2020-10-17 DIAGNOSIS — N186 End stage renal disease: Secondary | ICD-10-CM | POA: Diagnosis not present

## 2020-10-17 DIAGNOSIS — R0602 Shortness of breath: Secondary | ICD-10-CM | POA: Diagnosis not present

## 2020-10-17 LAB — TYPE AND SCREEN
ABO/RH(D): O POS
Antibody Screen: NEGATIVE
Unit division: 0
Unit division: 0
Unit division: 0
Unit division: 0
Unit division: 0
Unit division: 0
Unit division: 0
Unit division: 0

## 2020-10-17 LAB — BPAM RBC
Blood Product Expiration Date: 202204112359
Blood Product Expiration Date: 202204112359
Blood Product Expiration Date: 202204122359
Blood Product Expiration Date: 202204122359
Blood Product Expiration Date: 202204122359
Blood Product Expiration Date: 202204122359
Blood Product Expiration Date: 202204132359
Blood Product Expiration Date: 202204132359
ISSUE DATE / TIME: 202203100836
ISSUE DATE / TIME: 202203100836
ISSUE DATE / TIME: 202203100836
ISSUE DATE / TIME: 202203100836
ISSUE DATE / TIME: 202203101244
ISSUE DATE / TIME: 202203101244
Unit Type and Rh: 5100
Unit Type and Rh: 5100
Unit Type and Rh: 5100
Unit Type and Rh: 5100
Unit Type and Rh: 5100
Unit Type and Rh: 5100
Unit Type and Rh: 5100
Unit Type and Rh: 5100

## 2020-10-17 LAB — GLUCOSE, CAPILLARY
Glucose-Capillary: 82 mg/dL (ref 70–99)
Glucose-Capillary: 76 mg/dL (ref 70–99)
Glucose-Capillary: 84 mg/dL (ref 70–99)

## 2020-10-17 IMAGING — DX DG CHEST 1V PORT
1 series · 1 of 1 positions shown · non-contrast
Comparison: [DATE]

CLINICAL DATA: Status post mitral valve replacement.

EXAM:
PORTABLE CHEST 1 VIEW

[chest ap]
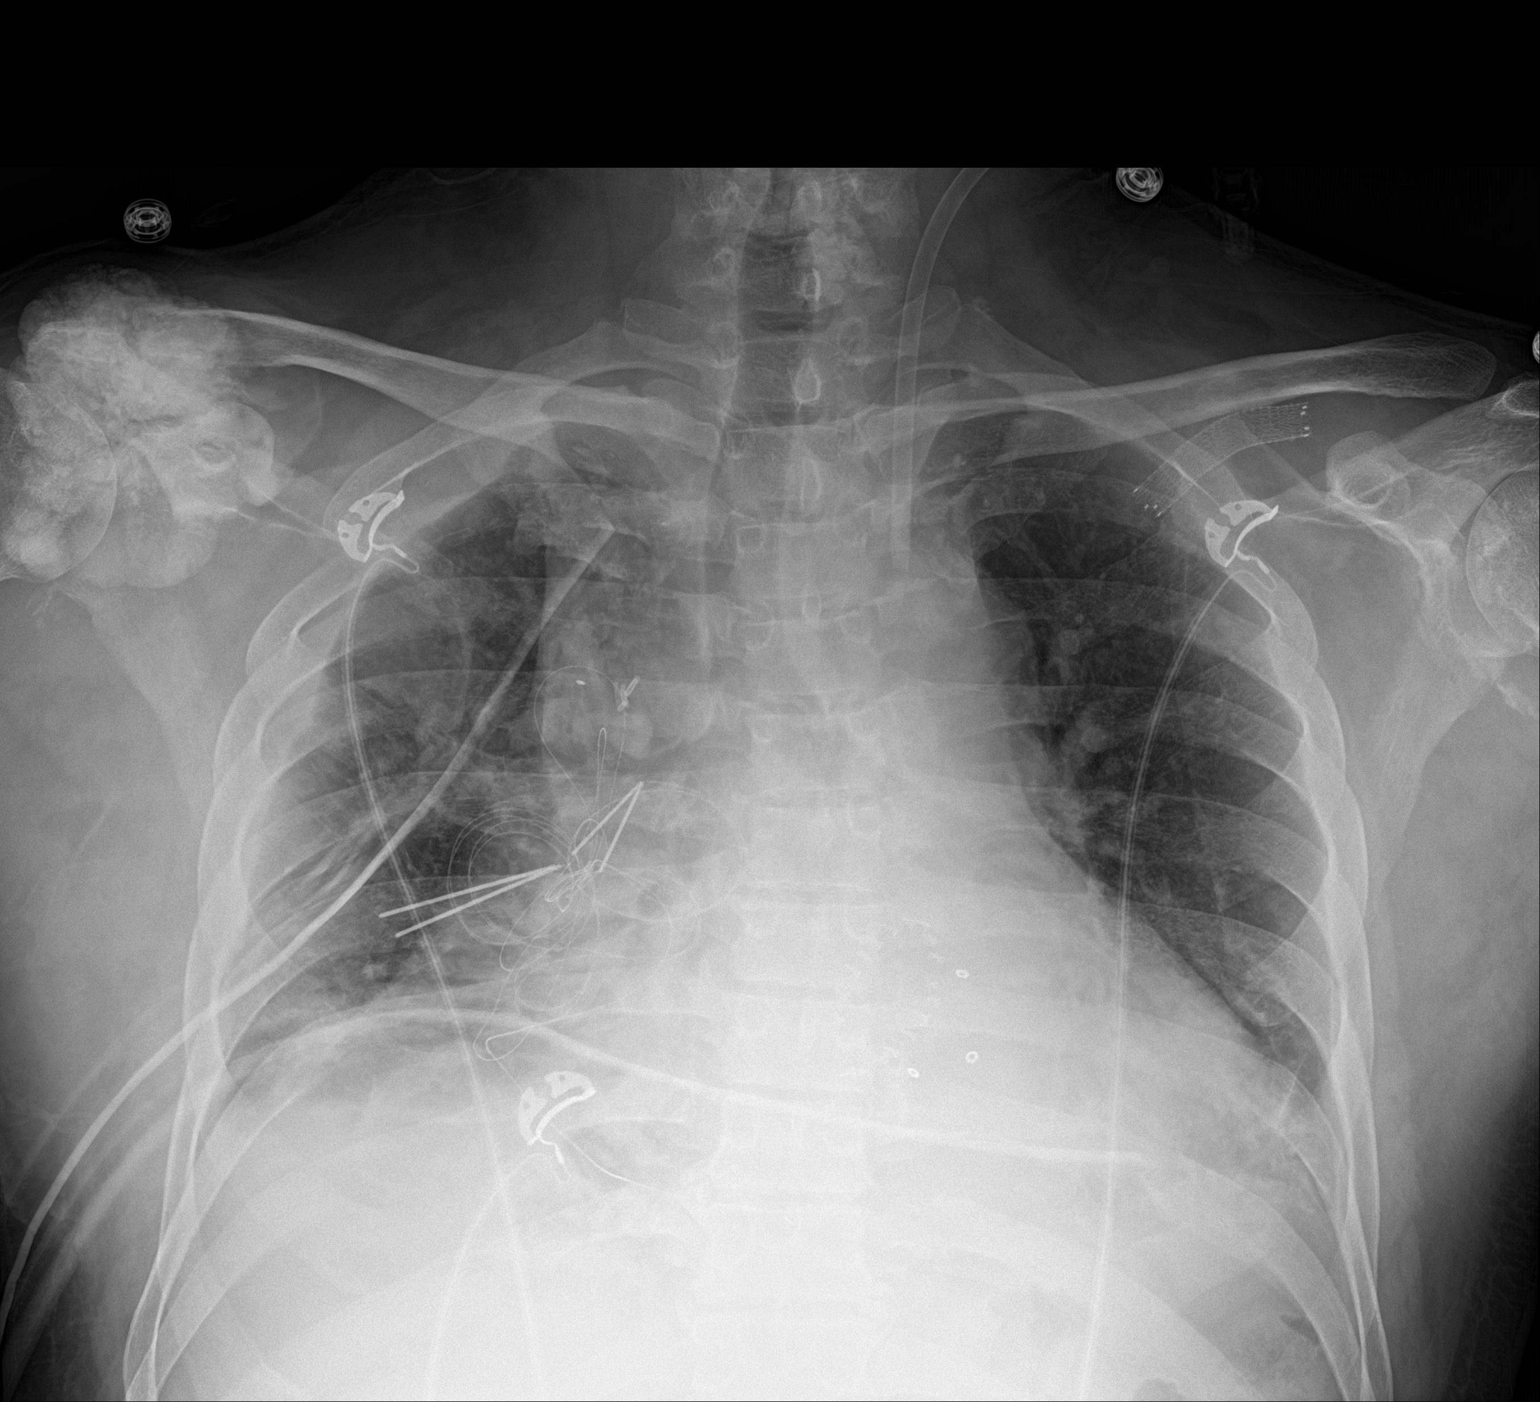

[1 of 1 positions shown; findings below may reference images not displayed]

FINDINGS: A left IJ sheath and chest tubes remain in place. There is a
right-sided pleural effusion with underlying atelectasis. Probable
atelectasis in the left base. The cardiomediastinal silhouette is
stable. No pneumothorax. Tumoral calcinosis identified in the right
shoulder.
IMPRESSION: No interval change in support apparatus. No pneumothorax. Continued
right effusion and bibasilar atelectasis.

## 2020-10-17 MED ORDER — TRAMADOL HCL 50 MG PO TABS
50.0000 mg | ORAL_TABLET | Freq: Four times a day (QID) | ORAL | Status: DC | PRN
  Administered 2020-10-18 – 2020-10-21 (×7): 50 mg via ORAL
  Filled 2020-10-17 (×7): qty 1

## 2020-10-17 MED ORDER — ASPIRIN EC 325 MG PO TBEC
325.0000 mg | DELAYED_RELEASE_TABLET | Freq: Every day | ORAL | Status: DC
  Administered 2020-10-18 – 2020-11-10 (×24): 325 mg via ORAL
  Filled 2020-10-17 (×24): qty 1

## 2020-10-17 MED ORDER — SODIUM CHLORIDE 0.9 % IV SOLN: 250.0000 mL | INTRAVENOUS | Status: DC | PRN

## 2020-10-17 MED ORDER — ONDANSETRON HCL 4 MG/2ML IJ SOLN: 4.0000 mg | Freq: Four times a day (QID) | INTRAMUSCULAR | Status: DC | PRN

## 2020-10-17 MED ORDER — ONDANSETRON HCL 4 MG PO TABS: 4.0000 mg | ORAL_TABLET | Freq: Four times a day (QID) | ORAL | Status: DC | PRN

## 2020-10-17 MED ORDER — OXYCODONE HCL 5 MG PO TABS
5.0000 mg | ORAL_TABLET | ORAL | Status: DC | PRN
  Administered 2020-10-17: 5 mg via ORAL
  Administered 2020-10-17 – 2020-10-21 (×15): 10 mg via ORAL
  Filled 2020-10-17 (×9): qty 2
  Filled 2020-10-17: qty 1
  Filled 2020-10-17 (×6): qty 2

## 2020-10-17 MED ORDER — SENNOSIDES-DOCUSATE SODIUM 8.6-50 MG PO TABS: 1.0000 | ORAL_TABLET | Freq: Two times a day (BID) | ORAL | Status: DC | PRN

## 2020-10-17 MED ORDER — ACETAMINOPHEN 325 MG PO TABS
650.0000 mg | ORAL_TABLET | Freq: Four times a day (QID) | ORAL | Status: DC | PRN
  Administered 2020-10-25: 650 mg via ORAL
  Filled 2020-10-17: qty 2

## 2020-10-17 MED ORDER — ~~LOC~~ CARDIAC SURGERY, PATIENT & FAMILY EDUCATION: Freq: Once | Status: AC

## 2020-10-17 MED ORDER — SODIUM CHLORIDE 0.9% FLUSH
3.0000 mL | INTRAVENOUS | Status: DC | PRN
Start: 2020-10-17 — End: 2020-11-10

## 2020-10-17 MED ORDER — DOXERCALCIFEROL 4 MCG/2ML IV SOLN
3.0000 ug | INTRAVENOUS | Status: DC
  Administered 2020-10-25: 3 ug via INTRAVENOUS
  Filled 2020-10-17 (×6): qty 2

## 2020-10-17 MED ORDER — SODIUM CHLORIDE 0.9% FLUSH
3.0000 mL | Freq: Two times a day (BID) | INTRAVENOUS | Status: DC
  Administered 2020-10-17 – 2020-11-09 (×39): 3 mL via INTRAVENOUS

## 2020-10-17 NOTE — Progress Notes (Signed)
3 Days Post-Op Procedure(s) (LRB): MINIMALLY INVASIVE MITRAL VALVE  REPLACEMENT(MVR) USING MEDTRONIC MOSAIC VALVE SIZE 31MM (Right) TRANSESOPHAGEAL ECHOCARDIOGRAM (TEE) (N/A) CLOSURE OF PATENT FORAMEN OVALE (N/A) Subjective: Only complaint is of chest wall pain. Ambulated this am but refused PT.  Had HD yesterday and did well. -3300 cc. Wt down 10 lbs from yesterday. 6 lbs over preop. Objective: Vital signs in last 24 hours: Temp:  [97.9 F (36.6 C)-98.7 F (37.1 C)] 98.7 F (37.1 C) (03/13 0800) Pulse Rate:  [80-102] 88 (03/13 0900) Cardiac Rhythm: Normal sinus rhythm (03/13 0800) Resp:  [11-28] 14 (03/13 0900) BP: (106-143)/(78-106) 128/86 (03/13 0900) SpO2:  [93 %-100 %] 96 % (03/13 0900) Weight:  [76.4 kg] 76.4 kg (03/13 0500)  Hemodynamic parameters for last 24 hours:    Intake/Output from previous day: 03/12 0701 - 03/13 0700 In: 840 [P.O.:360; NG/GT:480] Out: 4140 [Chest Tube:140] Intake/Output this shift: Total I/O In: 360 [P.O.:360] Out: -   General appearance: alert and cooperative Neurologic: intact Heart: regular rate and rhythm, S1, S2 normal, no murmur Lungs: clear to auscultation bilaterally Extremities: no edema Wound: dressing dry  Lab Results: Recent Labs    10/15/20 1641 10/16/20 0324  WBC 16.0* 15.5*  HGB 9.9* 9.6*  HCT 31.2* 29.4*  PLT 167 160   BMET:  Recent Labs    10/15/20 1641 10/16/20 0324  NA 134* 134*  K 3.7 4.1  CL 98 97*  CO2 24 26  GLUCOSE 125* 99  BUN 12 17  CREATININE 3.34* 4.46*  CALCIUM 8.4* 8.8*    PT/INR:  Recent Labs    10/14/20 1404  LABPROT 17.7*  INR 1.5*   ABG    Component Value Date/Time   PHART 7.393 10/15/2020 0622   HCO3 20.6 10/15/2020 0622   TCO2 22 10/15/2020 0622   ACIDBASEDEF 4.0 (H) 10/15/2020 0622   O2SAT 95.0 10/15/2020 0622   CBG (last 3)  Recent Labs    10/16/20 2134 10/17/20 0625 10/17/20 0807  GLUCAP 111* 82 76   CXR:  bibasilar atelectasis.  Assessment/Plan: S/P  Procedure(s) (LRB): MINIMALLY INVASIVE MITRAL VALVE  REPLACEMENT(MVR) USING MEDTRONIC MOSAIC VALVE SIZE 31MM (Right) TRANSESOPHAGEAL ECHOCARDIOGRAM (TEE) (N/A) CLOSURE OF PATENT FORAMEN OVALE (N/A)  POD 3  Hemodynamically stable in sinus rhythm. Continue Lopressor.  ESRD on HD. Wt decreased nicely. Repeat BMET in am.  Chest tube output only 40 cc last shift. Remove tubes.  Glucose under good control with normal Hgb A1c preop on no meds. DC CBG's and SSI.  Continue vanc for MRSA endocarditis.  DC central line.  Transfer to 4E.   LOS: 20 days    Patrick Anthony 10/17/2020

## 2020-10-17 NOTE — Plan of Care (Signed)
  Problem: Education: Goal: Knowledge of disease or condition will improve Outcome: Progressing   Problem: Self-Care: Goal: Ability to participate in self-care as condition permits will improve Outcome: Progressing   Problem: Skin Integrity: Goal: Wound healing without signs and symptoms of infection Outcome: Progressing

## 2020-10-17 NOTE — Evaluation (Signed)
Physical Therapy Evaluation Patient Details Name: Patrick Anthony MRN: [id number] DOB: [birth year] Today's Date: 10/17/2020   History of Present Illness  Pt is a 50 y.o. male admitted 09/27/20 with fever, chills, chest pain, SOB, generalized weakness. Pt found to have MRSA bacteremia. 2/22 MRI showed multiple scattered small infarcts (bilateral cerebral hemispheres, R basal ganglia, bileteral cerebellum); suspect embolic process. Echo showed mitral valve vegetation, mild-moderate mitral regurgitation. No significant CAD on L heart cath. 3/8 dental extraction. 3/10 mini MVR with closure of PFO. PMH includes CKD (HD MWF), HTN, CHF, Hep C, polysubstance abuse.  Clinical Impression  Pt pleasant and agreeable to attempt mobility within pain tolerance. Pt in chair on arrival and needing to return to bed for pulling CT. Pt able to walk 200' with RW with slow cautious gait with pt reporting limited by chest pain. Pt lives alone and plans to return to studio apartment. Discussed with pt need to participate with therapy and cardiac rehab acutely to make return home possible and pt stating understanding and agreement. Pt with decreased transfers, gait and activity tolerance who will benefit from acute therapy to maximize mobility, safety and function.   HR 92-110 Pre gait 124/92  Post gait 139/117 SpO2 99% on RA    Follow Up Recommendations Home health PT    Equipment Recommendations  Rolling walker with 5" wheels;3in1 (PT)    Recommendations for Other Services       Precautions / Restrictions Precautions Precautions: Fall;Other (comment) Precaution Comments: chest tube      Mobility  Bed Mobility   Bed Mobility: Sit to Supine       Sit to supine: Min assist   General bed mobility comments: min assist to clear legs onto surface    Transfers Overall transfer level: Needs assistance   Transfers: Sit to/from Stand Sit to Stand: Min guard         General transfer comment: cues for  hand placement and management of lines  Ambulation/Gait Ambulation/Gait assistance: Min guard Gait Distance (Feet): 200 Feet Assistive device: Rolling walker (2 wheeled) Gait Pattern/deviations: Step-through pattern;Decreased stride length;Trunk flexed   Gait velocity interpretation: <1.8 ft/sec, indicate of risk for recurrent falls General Gait Details: cues for proximity to RW, safety and direction  Stairs            Wheelchair Mobility    Modified Rankin (Stroke Patients Only)       Balance Overall balance assessment: Mild deficits observed, not formally tested                                           Pertinent Vitals/Pain Pain Score: 6  Pain Location: chest at CT site Pain Descriptors / Indicators: Aching;Constant;Guarding Pain Intervention(s): Limited activity within patient's tolerance;Monitored during session;Premedicated before session;Repositioned    Home Living Family/patient expects to be discharged to:: Private residence Living Arrangements: Alone   Type of Home: Apartment Home Access: Stairs to enter   Technical brewer of Steps: flight Home Layout: One level Home Equipment: None      Prior Function Level of Independence: Independent         Comments: likes to fish, doesn't work, drives himself to HD     [employer]        Extremity/Trunk Assessment   Upper Extremity Assessment Upper Extremity Assessment: Generalized weakness    Lower Extremity Assessment Lower Extremity Assessment: Generalized weakness  Cervical / Trunk Assessment Cervical / Trunk Assessment: Normal  Communication   Communication: No difficulties  Cognition Arousal/Alertness: Awake/alert Behavior During Therapy: Flat affect Overall Cognitive Status: Within Functional Limits for tasks assessed                                        General Comments      Exercises     Assessment/Plan    PT Assessment  Patient needs continued PT services  PT Problem List Decreased mobility;Decreased activity tolerance;Decreased balance;Decreased knowledge of use of DME;Cardiopulmonary status limiting activity       PT Treatment Interventions DME instruction;Gait training;Functional mobility training;Therapeutic activities;Therapeutic exercise;Patient/family education;Stair training    PT Goals (Current goals can be found in the Care Plan section)  Acute Rehab PT Goals Patient Stated Goal: return home and fish PT Goal Formulation: With patient Time For Goal Achievement: 10/31/20 Potential to Achieve Goals: Fair    Frequency Min 3X/week   Barriers to discharge Decreased caregiver support      Co-evaluation               AM-PAC PT "6 Clicks" Mobility  Outcome Measure Help needed turning from your back to your side while in a flat bed without using bedrails?: A Little Help needed moving from lying on your back to sitting on the side of a flat bed without using bedrails?: A Little Help needed moving to and from a bed to a chair (including a wheelchair)?: A Little Help needed standing up from a chair using your arms (e.g., wheelchair or bedside chair)?: A Little Help needed to walk in hospital room?: A Little Help needed climbing 3-5 steps with a railing? : A Lot 6 Click Score: 17    End of Session   Activity Tolerance: Patient tolerated treatment well Patient left: in bed;with call bell/phone within reach Nurse Communication: Mobility status PT Visit Diagnosis: Difficulty in walking, not elsewhere classified (R26.2);Other abnormalities of gait and mobility (R26.89)    Time: [id number] PT Time Calculation (min) (ACUTE ONLY): 21 min   Charges:   PT Evaluation $PT Eval Moderate Complexity: 1 Mod          Clifton Kovacic P, PT Acute Rehabilitation Services Pager: [phone] Office: [place] Nam 10/17/2020, 10:57 AM

## 2020-10-17 NOTE — Progress Notes (Signed)
Pharmacy Antibiotic Note  Patrick Anthony is a 50 y.o. male admitted on 09/27/2020 with fever, chills, and generalized weakness.  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  Pt has ESRD with HD MWF.  Plans are for 6 weeks of antibiotics with day 1 being post-op day 1 from valve surgery per 3/7 ID note  3/10 > (4/21).  Patient had extra HD session yesterday. Post-HD vancomycin level was supratherapeutic at 29 (goal 15-25). Level drawn 3 hrs post HD session. Did not give an additional dose. Remains anuric. Next HD planned for tomorrow. Will plan to recheck another vanc level after session tomorrow.   Plan: -Hold vancomycin -Check pre-HD vancomycin level prior to Wednesday HD session (no need for pre-HD level before Monday's session) -Will continue to follow HD schedule/duration  Height: 6' (182.9 cm) Weight: 76.4 kg (168 lb 6.9 oz) IBW/kg (Calculated) : 77.6  Temp (24hrs), Avg:98.4 F (36.9 C), Min:97.9 F (36.6 C), Max:98.7 F (37.1 C)  Recent Labs  Lab 10/11/20 1203 10/12/20 1226 10/14/20 1254 10/14/20 1404 10/14/20 2022 10/15/20 0406 10/15/20 1641 10/16/20 0324 10/16/20 2252  WBC 14.9*   < >  --  17.6* 13.9* 14.2* 16.0* 15.5*  --   CREATININE 11.02*   < > 5.10*  --  5.45* 6.07* 3.34* 4.46*  --   VANCOTROUGH 29*  --   --   --   --   --   --   --   --   VANCORANDOM  --   --   --   --   --   --   --   --  29   < > = values in this interval not displayed.    Estimated Creatinine Clearance: 21.7 mL/min (A) (by C-G formula based on SCr of 4.46 mg/dL (H)).    No Known Allergies  Antimicrobials this admission: Vanc 2/21>> Cefepime 2/21>>2/22 Flagyl 2/21>>2/22 (2 doses given)  Dose adjustments this admission: 2/28 Pre-HD VR 41 - estimate post HD level 27 Dose held 2/28 and reduced to '750mg'$  IV qHD starting 3/2  Microbiology results: 2/21 blood x 2: MRSA, sens Vanc MIC < 0.5, Septra, TCN, gent MIC < 0.5, Clinda 2/22 BCID:  MRSA  2/21 COVID and flu: negative 2/21 HIV: non-reactive 2/24 blood x 2: 1/2 SA 2/27 blood x 2: neg   Richardine Service, PharmD, BCPS PGY2 Cardiology Pharmacy Resident Phone: [phone] 10/17/2020  9:56 AM  Please check [email or web address] for unit-specific pharmacy phone numbers.

## 2020-10-17 NOTE — Progress Notes (Addendum)
Mobility Specialist: Progress Note   10/17/20 1738  Mobility  Activity Ambulated in hall  Level of Assistance Modified independent, requires aide device or extra time  Assistive Device Front wheel walker  Distance Ambulated (ft) 470 ft  Mobility Response Tolerated well  Mobility performed by Mobility specialist  $Mobility charge 1 Mobility   Post-Mobility: 78 HR, 136/95 BP  Pt c/o pain on his R side in his trunk during ambulation, no area or rating specified. Pt also felt "wobbly" halfway through ambulation and took one brief standing break. Pt back to bed after walk and is requesting pain medication, RN notified.   [facility] Shenna Brissette Mobility Specialist Mobility Specialist Phone: [phone]

## 2020-10-17 NOTE — Progress Notes (Signed)
PT Cancellation Note  Patient Details Name: Patrick Anthony MRN: [id number] DOB: [birth year]   Cancelled Treatment:    Reason Eval/Treat Not Completed: Patient declined, no reason specified (pt declining mobility at this time stating pain but that he did walk this am with nursing)   Sandy Salaam Chayce Rullo 10/17/2020, 8:14 AM  [place] Pager: [phone] Office: [phone]

## 2020-10-17 NOTE — Progress Notes (Signed)
Patient transferred to 4E via stretcher by SWAT Nurse.

## 2020-10-17 NOTE — Progress Notes (Signed)
[facility] Progress Note   Subjective: seen in room, 4 L off again yest w/ HD, down to 76kg. No c/o today.   Objective Vitals:   10/17/20 0615 10/17/20 0645 10/17/20 0700 10/17/20 0800  BP: 108/90 117/86 115/90 139/87  Pulse: 81 80 80 85  Resp: '14 17 11 '$ (!) 24  Temp:    98.7 F (37.1 C)  TempSrc:    Oral  SpO2: 96% 100% 100% 100%  Weight:      Height:       Physical Exam Gen: alert, calm today Heart:S1,S2 now with mitral click  Lungs: clear bilat, no wheezing Abdomen:OG in place. No BS.  Ext: +1-2 bilat LE edema. Dialysis Access:L AVG + Bruit   OP HD:[facility]WF [facility] 4h 350/500 new edw here 57- 75kg (was 82.5kg)2/2.5 bath LUA AVG   Hep 5000 -Aranesp 6mgIVq 2wks - last 09/27/20 (Verified order) -Hectorol315m IV qHD   Assessment/Plan: 1. 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 Doing well.  Chest tubes out today and going to regular floor bed.  2. Volumeexcess - resolved. New dry wt here prior to valve surgery was down to 73-75kg. 76kg today, on RA and BP's good w/o sig ^vol on exam. Would set new dry wt around 74- 76kg.  3. HTN - continues on clonidine patch and metoprolol 25 bid 4. 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 S/P tooth extraction3/8/22.Per ID to get 6 wks IV vanc w/ new end date of 11/25/20.  5. ESRD - usual HD MWF. Next HD Monday upstairs. 6. Anemiaof CKD-HGB 9- 10 range. S/P 2 units of PRBCs 3/10.  7. Secondary Hyperparathyroidism - sensipar, binders. Continue hect 3ug tiw 8. Hx Hep C /liver cirrhosis.    RoKelly SplinterMD 10/17/2020, 9:21 AM             Labs: Basic Metabolic Panel: Recent Labs  Lab 10/11/20 1203 10/12/20 1226 10/14/20 0406 10/14/20 0818 10/15/20 0406 10/15/20 0622 10/15/20 1641 10/16/20 0324  NA 129*   < > 132*   < > 134* 136 134* 134*  K 5.0   < > 4.6   < > 5.0 5.0 3.7 4.1  CL 90*   < > 94*   < > 101  --  98  97*  CO2 23   < > 28   < > 18*  --  24 26  GLUCOSE 109*   < > 87   < > 122*  --  125* 99  BUN 56*   < > 19   < > 30*  --  12 17  CREATININE 11.02*   < > 5.60*   < > 6.07*  --  3.34* 4.46*  CALCIUM 8.1*   < > 9.1   < > 7.9*  --  8.4* 8.8*  PHOS 7.8*  --  5.8*  --   --   --   --   --    < > = values in this interval not displayed.   Liver Function Tests: Recent Labs  Lab 10/11/20 1203 10/13/20 0145 10/14/20 0406  AST  --  27 35  ALT  --  12 10  ALKPHOS  --  52 46  BILITOT  --  1.0 0.8  PROT  --  8.1 8.7*  ALBUMIN 2.1* 2.3* 2.4*   No results for input(s): LIPASE, AMYLASE in the last 168 hours. CBC: Recent Labs  Lab 10/14/20 1404 10/14/20 1433 10/14/20  2022 10/14/20 2354 10/15/20 0406 10/15/20 0622 10/15/20 1641 10/16/20 0324  WBC 17.6*  --  13.9*  --  14.2*  --  16.0* 15.5*  HGB 9.2*   < > 9.2*   < > 9.4* 9.5* 9.9* 9.6*  HCT 29.3*   < > 28.0*   < > 29.7* 28.0* 31.2* 29.4*  MCV 89.1  --  87.0  --  88.9  --  87.4 88.0  PLT 121*  --  122*  --  140*  --  167 160   < > = values in this interval not displayed.   Medications: . sodium chloride    . sodium chloride    . nitroGLYCERIN Stopped (10/15/20 2204)   . acetaminophen  1,000 mg Oral Q6H  . aspirin EC  325 mg Oral Daily  . bisacodyl  10 mg Oral Daily   Or  . bisacodyl  10 mg Rectal Daily  . calcium acetate  667 mg Oral TID WC  . Chlorhexidine Gluconate Cloth  6 each Topical Daily  . cinacalcet  30 mg Oral Q breakfast  . cloNIDine  0.3 mg Transdermal Weekly  . docusate sodium  200 mg Oral Daily  . feeding supplement (NEPRO CARB STEADY)  237 mL Oral BID BM  . gabapentin  200 mg Oral QHS  . influenza vac split quadrivalent PF  0.5 mL Intramuscular Tomorrow-1000  . insulin aspart  0-24 Units Subcutaneous TID WC & HS  . metoprolol tartrate  25 mg Oral BID  . multivitamin  1 tablet Oral QHS  . pantoprazole  40 mg Oral Daily  . sodium chloride flush  3 mL Intravenous Q12H  . vancomycin  500 mg Intravenous Q  M,W,F-HD

## 2020-10-18 ENCOUNTER — Inpatient Hospital Stay (HOSPITAL_COMMUNITY): Payer: Medicaid Other

## 2020-10-18 DIAGNOSIS — J9811 Atelectasis: Secondary | ICD-10-CM | POA: Diagnosis not present

## 2020-10-18 DIAGNOSIS — J189 Pneumonia, unspecified organism: Secondary | ICD-10-CM | POA: Diagnosis not present

## 2020-10-18 DIAGNOSIS — D631 Anemia in chronic kidney disease: Secondary | ICD-10-CM | POA: Diagnosis not present

## 2020-10-18 DIAGNOSIS — N2581 Secondary hyperparathyroidism of renal origin: Secondary | ICD-10-CM | POA: Diagnosis not present

## 2020-10-18 DIAGNOSIS — R0602 Shortness of breath: Secondary | ICD-10-CM | POA: Diagnosis not present

## 2020-10-18 DIAGNOSIS — A419 Sepsis, unspecified organism: Secondary | ICD-10-CM | POA: Diagnosis not present

## 2020-10-18 DIAGNOSIS — I517 Cardiomegaly: Secondary | ICD-10-CM | POA: Diagnosis not present

## 2020-10-18 DIAGNOSIS — I639 Cerebral infarction, unspecified: Secondary | ICD-10-CM | POA: Diagnosis not present

## 2020-10-18 DIAGNOSIS — I132 Hypertensive heart and chronic kidney disease with heart failure and with stage 5 chronic kidney disease, or end stage renal disease: Secondary | ICD-10-CM | POA: Diagnosis not present

## 2020-10-18 DIAGNOSIS — N186 End stage renal disease: Secondary | ICD-10-CM | POA: Diagnosis not present

## 2020-10-18 DIAGNOSIS — Z992 Dependence on renal dialysis: Secondary | ICD-10-CM | POA: Diagnosis not present

## 2020-10-18 DIAGNOSIS — I5032 Chronic diastolic (congestive) heart failure: Secondary | ICD-10-CM | POA: Diagnosis not present

## 2020-10-18 DIAGNOSIS — R531 Weakness: Secondary | ICD-10-CM | POA: Diagnosis not present

## 2020-10-18 LAB — BASIC METABOLIC PANEL
Anion gap: 9 (ref 5–15)
BUN: 28 mg/dL — ABNORMAL HIGH (ref 6–20)
CO2: 25 mmol/L (ref 22–32)
Calcium: 8.1 mg/dL — ABNORMAL LOW (ref 8.9–10.3)
Chloride: 93 mmol/L — ABNORMAL LOW (ref 98–111)
Creatinine, Ser: 5.37 mg/dL — ABNORMAL HIGH (ref 0.61–1.24)
GFR, Estimated: 12 mL/min — ABNORMAL LOW (ref >60)
Glucose, Bld: 80 mg/dL (ref 70–99)
Potassium: 4.3 mmol/L (ref 3.5–5.1)
Sodium: 127 mmol/L — ABNORMAL LOW (ref 135–145)

## 2020-10-18 LAB — CBC
HCT: 29.4 % — ABNORMAL LOW (ref 39.0–52.0)
Hemoglobin: 9.1 g/dL — ABNORMAL LOW (ref 13.0–17.0)
MCH: 28.3 pg (ref 26.0–34.0)
MCHC: 31 g/dL (ref 30.0–36.0)
MCV: 91.3 fL (ref 80.0–100.0)
Platelets: 211 10*3/uL (ref 150–400)
RBC: 3.22 MIL/uL — ABNORMAL LOW (ref 4.22–5.81)
RDW: 17.4 % — ABNORMAL HIGH (ref 11.5–15.5)
WBC: 13.3 10*3/uL — ABNORMAL HIGH (ref 4.0–10.5)
nRBC: 0.2 % (ref 0.0–0.2)

## 2020-10-18 IMAGING — CR DG CHEST 2V
2 series · 2 of 2 positions shown · non-contrast
Comparison: [DATE]

CLINICAL DATA: Status post mitral valve replacement

EXAM:
CHEST - 2 VIEW

[chest lat]
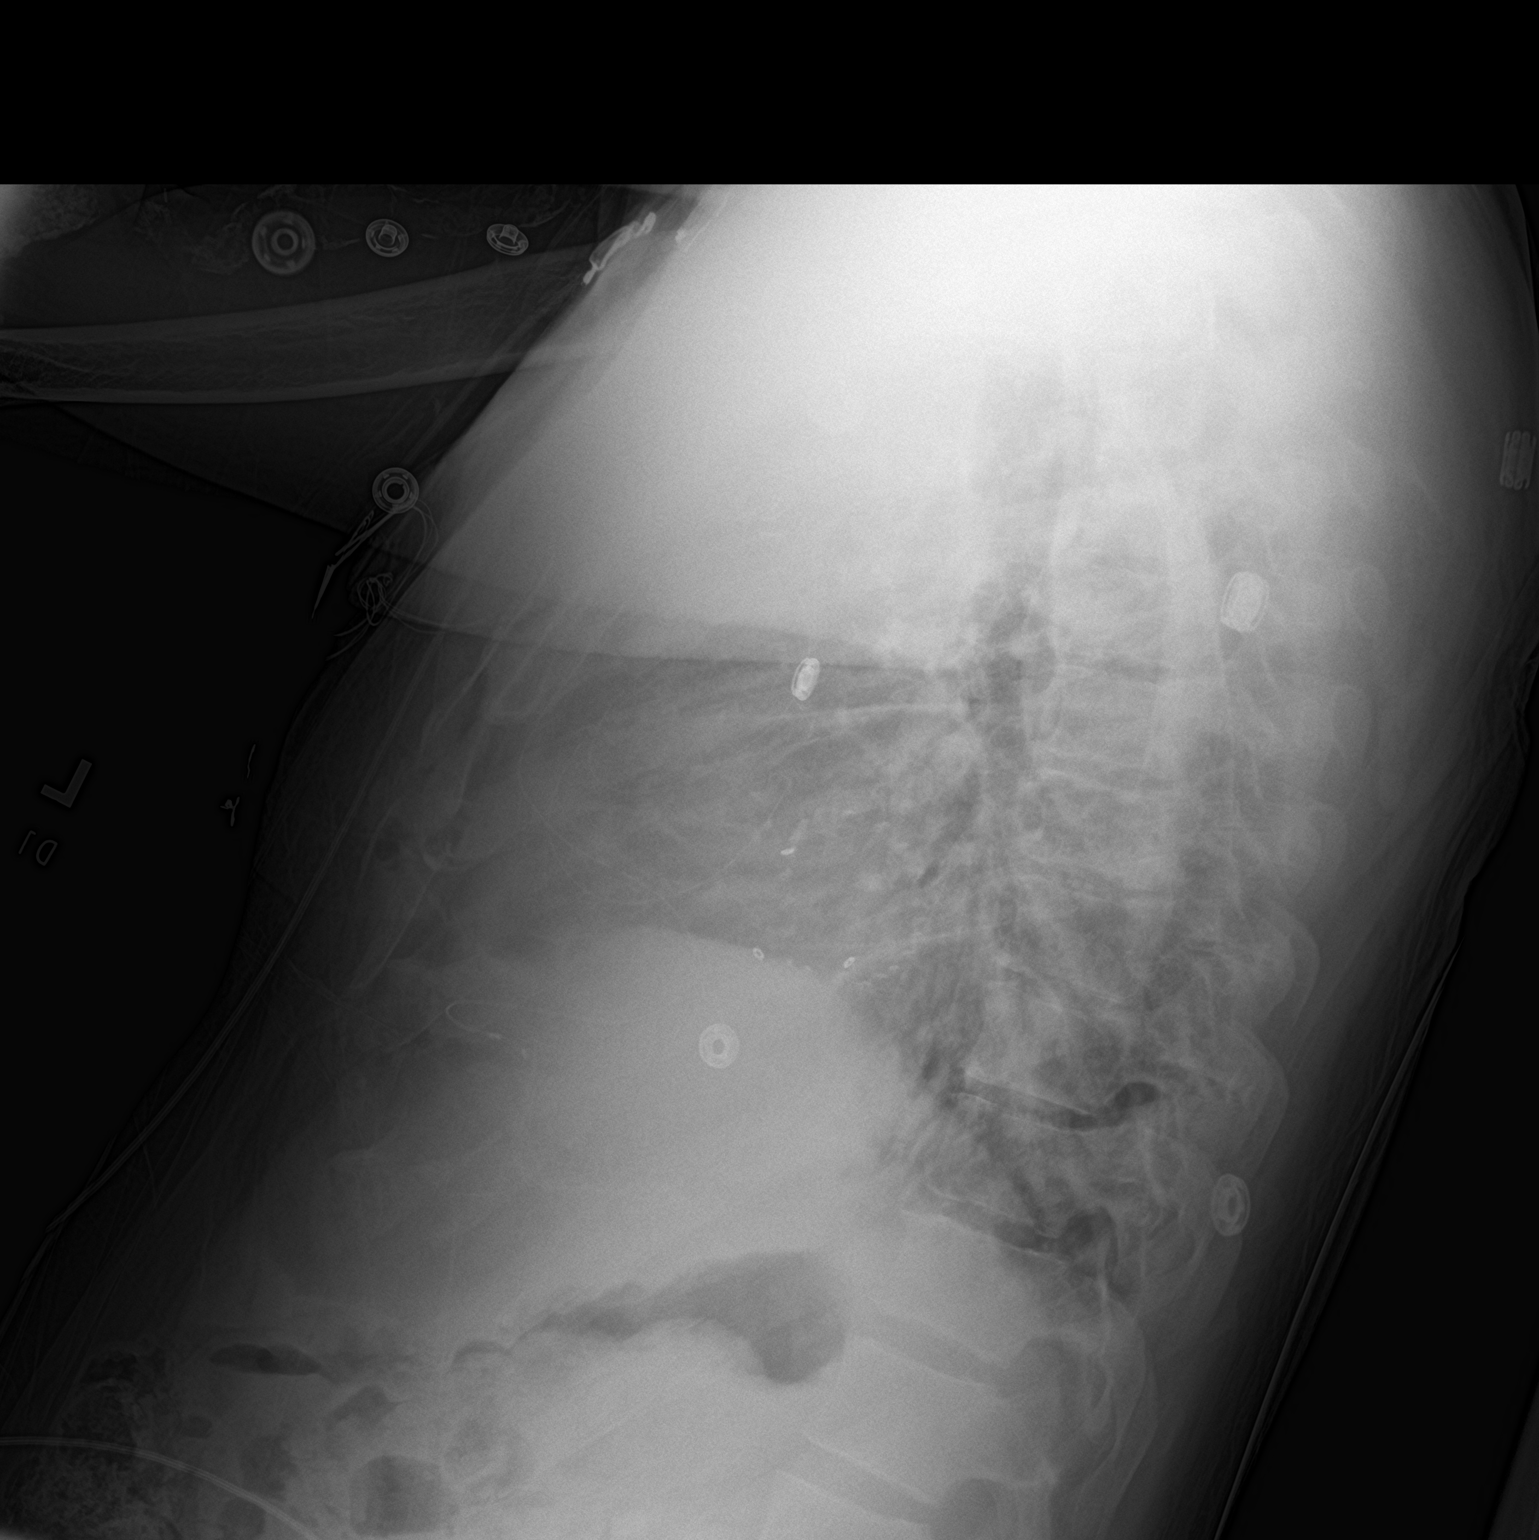

[chest ap]
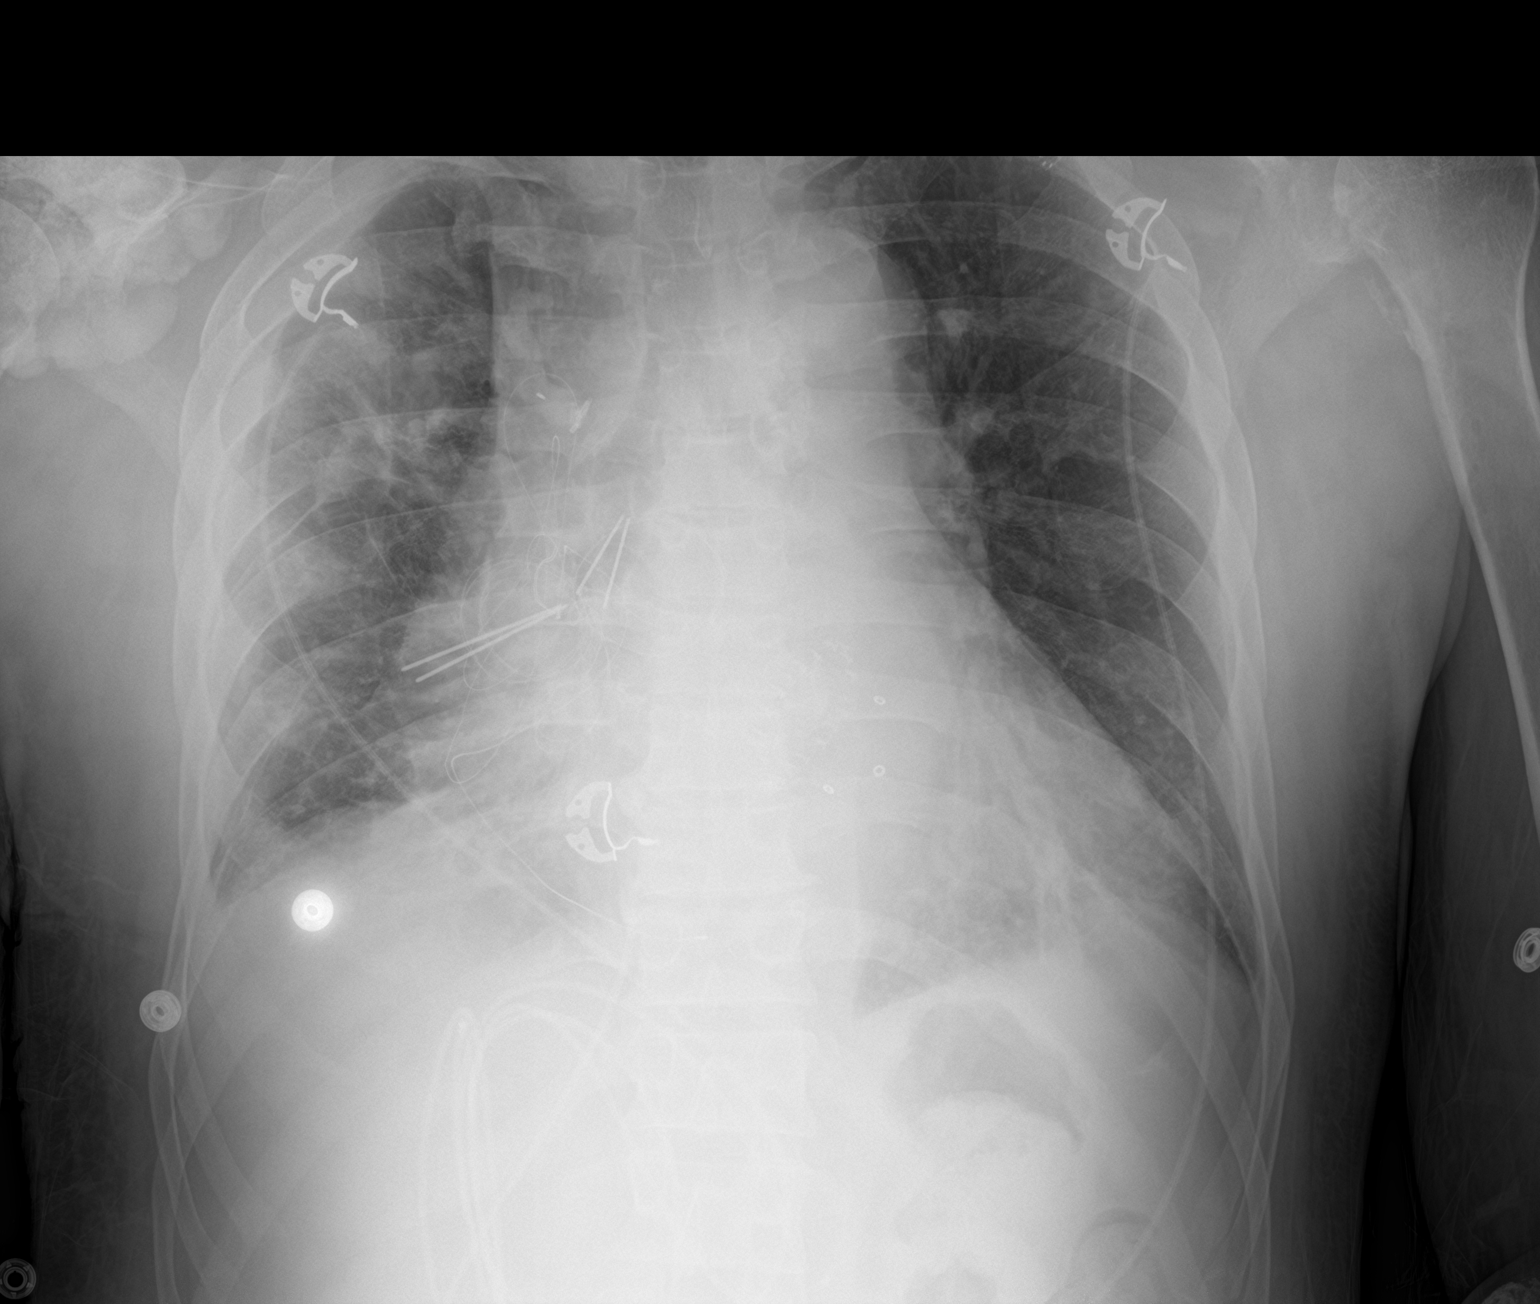

[2 of 2 positions shown; findings below may reference images not displayed]

FINDINGS: Right chest tube and Cordis have been removed. No pneumothorax.
Temporary pacemaker wires are attached to the right heart. There is
stable cardiomegaly with pulmonary vascularity within normal limits.
There is now ill-defined airspace opacity in the right upper lobe.
There is atelectatic change in the bases. There is a prosthetic
mitral valve. There is a stent in the left subclavian region.
Tumoral calcinosis right shoulder again noted.
IMPRESSION: No pneumothorax. New infiltrate right upper lobe concerning for
developing pneumonia. Bibasilar atelectasis. Stable cardiomegaly.

Tumoral calcinosis right shoulder region again noted.

## 2020-10-18 MED ORDER — VANCOMYCIN VARIABLE DOSE PER UNSTABLE RENAL FUNCTION (PHARMACIST DOSING): Status: DC

## 2020-10-18 MED ORDER — DOXERCALCIFEROL 4 MCG/2ML IV SOLN
INTRAVENOUS | Status: AC
  Administered 2020-10-18: 3 ug via INTRAVENOUS
  Filled 2020-10-18: qty 2

## 2020-10-18 MED ORDER — DARBEPOETIN ALFA 100 MCG/0.5ML IJ SOSY
100.0000 ug | PREFILLED_SYRINGE | INTRAMUSCULAR | Status: DC
  Filled 2020-10-18: qty 0.5

## 2020-10-18 MED ORDER — ENOXAPARIN SODIUM 30 MG/0.3ML ~~LOC~~ SOLN
30.0000 mg | SUBCUTANEOUS | Status: DC
  Administered 2020-10-19 – 2020-11-04 (×8): 30 mg via SUBCUTANEOUS
  Filled 2020-10-18 (×11): qty 0.3

## 2020-10-18 MED ORDER — METOPROLOL TARTRATE 50 MG PO TABS
50.0000 mg | ORAL_TABLET | Freq: Two times a day (BID) | ORAL | Status: DC
  Administered 2020-10-18 – 2020-11-10 (×46): 50 mg via ORAL
  Filled 2020-10-18 (×47): qty 1

## 2020-10-18 MED FILL — Sodium Bicarbonate IV Soln 8.4%: INTRAVENOUS | Qty: 50 | Status: AC

## 2020-10-18 MED FILL — Heparin Sodium (Porcine) Inj 1000 Unit/ML: INTRAMUSCULAR | Qty: 20 | Status: AC

## 2020-10-18 MED FILL — Sodium Chloride IV Soln 0.9%: INTRAVENOUS | Qty: 4000 | Status: AC

## 2020-10-18 MED FILL — Electrolyte-R (PH 7.4) Solution: INTRAVENOUS | Qty: 4000 | Status: AC

## 2020-10-18 NOTE — Progress Notes (Signed)
[facility] ROUNDING NOTE   Subjective:   Brief history: Status post minimally invasive mitral valve replacement 10/14/2020.  End-stage renal disease Monday Wednesday Friday dialysis.  Next dialysis treatment planned for 10/18/2020.  Her last dialysis was 10/16/2020 with 4 L removed.  Blood pressure 161/101 pulse 85 temperature 98.7 O2 sats 97% room air  Sodium 127 potassium 4.3 chloride 97 CO2 25 BUN 28 creatinine 5.37 glucose 80.  Hemoglobin 9.6  Objective:  Vital signs in last 24 hours:  Temp:  [97.6 F (36.4 C)-98.7 F (37.1 C)] 98.7 F (37.1 C) (03/14 0529) Pulse Rate:  [75-110] 80 (03/14 0529) Resp:  [12-24] 12 (03/14 0529) BP: (113-161)/(86-117) 161/102 (03/14 0529) SpO2:  [96 %-100 %] 97 % (03/14 0529) Weight:  [78.2 kg] 78.2 kg (03/14 0500)  Weight change: 1.8 kg Filed Weights   10/16/20 0600 10/17/20 0500 10/18/20 0500  Weight: 80.7 kg 76.4 kg 78.2 kg    Intake/Output: I/O last 3 completed shifts: In: 360 [P.O.:360] Out: 4030 [Other:4000; Chest Tube:30]   Intake/Output this shift:  No intake/output data recorded.  Gen: alert, calm today Heart:S1,S2 now with mitral click  Lungs: clear bilat, no wheezing Abdomen:OG in place. No BS.  Ext: +1-2 bilat LE edema. Dialysis Access:L AVG + Bruit   Basic Metabolic Panel: Recent Labs  Lab 10/11/20 1203 10/12/20 1226 10/14/20 0406 10/14/20 0818 10/14/20 2022 10/14/20 2354 10/15/20 0406 10/15/20 0622 10/15/20 1641 10/16/20 0324 10/18/20 0148  NA 129*   < > 132*   < > 135   < > 134* 136 134* 134* 127*  K 5.0   < > 4.6   < > 5.7*   < > 5.0 5.0 3.7 4.1 4.3  CL 90*   < > 94*   < > 104  --  101  --  98 97* 93*  CO2 23   < > 28  --  19*  --  18*  --  '24 26 25  '$ GLUCOSE 109*   < > 87   < > 95  --  122*  --  125* 99 80  BUN 56*   < > 19   < > 26*  --  30*  --  12 17 28*  CREATININE 11.02*   < > 5.60*   < > 5.45*  --  6.07*  --  3.34* 4.46* 5.37*  CALCIUM 8.1*   < > 9.1  --  7.7*  --  7.9*  --  8.4*  8.8* 8.1*  MG  --   --  2.1  --  2.6*  --  2.6*  --  2.0  --   --   PHOS 7.8*  --  5.8*  --   --   --   --   --   --   --   --    < > = values in this interval not displayed.    Liver Function Tests: Recent Labs  Lab 10/11/20 1203 10/13/20 0145 10/14/20 0406  AST  --  27 35  ALT  --  12 10  ALKPHOS  --  52 46  BILITOT  --  1.0 0.8  PROT  --  8.1 8.7*  ALBUMIN 2.1* 2.3* 2.4*   No results for input(s): LIPASE, AMYLASE in the last 168 hours. No results for input(s): AMMONIA in the last 168 hours.  CBC: Recent Labs  Lab 10/14/20 1404 10/14/20 1433 10/14/20 2022 10/14/20 2354 10/15/20 0405 10/15/20 0406 10/15/20 0622 10/15/20 1641  10/16/20 0324  WBC 17.6*  --  13.9*  --   --  14.2*  --  16.0* 15.5*  HGB 9.2*   < > 9.2*   < > 10.2* 9.4* 9.5* 9.9* 9.6*  HCT 29.3*   < > 28.0*   < > 30.0* 29.7* 28.0* 31.2* 29.4*  MCV 89.1  --  87.0  --   --  88.9  --  87.4 88.0  PLT 121*  --  122*  --   --  140*  --  167 160   < > = values in this interval not displayed.    Cardiac Enzymes: No results for input(s): CKTOTAL, CKMB, CKMBINDEX, TROPONINI in the last 168 hours.  BNP: Invalid input(s): POCBNP  CBG: Recent Labs  Lab 10/16/20 1529 10/16/20 2134 10/17/20 0625 10/17/20 0807 10/17/20 1158  GLUCAP 105* 111* 82 76 84    Microbiology: Results for orders placed or performed during the hospital encounter of 09/27/20  Blood Culture (routine x 2)     Status: Abnormal   Collection Time: 09/27/20  2:16 PM   Specimen: BLOOD RIGHT HAND  Result Value Ref Range Status   Specimen Description BLOOD RIGHT HAND  Final   Special Requests   Final    BOTTLES DRAWN AEROBIC AND ANAEROBIC Blood Culture results may not be optimal due to an inadequate volume of blood received in culture bottles Performed at [facility], [place] [address] [place], [place] [address]    Culture  Setup Time   Final    GRAM POSITIVE COCCI IN CLUSTERS IN BOTH AEROBIC AND ANAEROBIC BOTTLES    Culture  METHICILLIN RESISTANT STAPHYLOCOCCUS AUREUS (A)  Final   Report Status 09/30/2020 FINAL  Final   Organism ID, Bacteria METHICILLIN RESISTANT STAPHYLOCOCCUS AUREUS  Final      Susceptibility   Methicillin resistant staphylococcus aureus - MIC*    CIPROFLOXACIN >=8 RESISTANT Resistant     ERYTHROMYCIN >=8 RESISTANT Resistant     GENTAMICIN <=0.5 SENSITIVE Sensitive     OXACILLIN >=4 RESISTANT Resistant     TETRACYCLINE <=1 SENSITIVE Sensitive     VANCOMYCIN <=0.5 SENSITIVE Sensitive     TRIMETH/SULFA <=10 SENSITIVE Sensitive     CLINDAMYCIN <=0.25 SENSITIVE Sensitive     RIFAMPIN <=0.5 SENSITIVE Sensitive     Inducible Clindamycin NEGATIVE Sensitive     * METHICILLIN RESISTANT STAPHYLOCOCCUS AUREUS  Resp Panel by RT-PCR (Flu A&B, Covid) Nasopharyngeal Swab     Status: None   Collection Time: 09/27/20  2:16 PM   Specimen: Nasopharyngeal Swab; Nasopharyngeal(NP) swabs in vial transport medium  Result Value Ref Range Status   SARS Coronavirus 2 by RT PCR NEGATIVE NEGATIVE Final    Comment: (NOTE) SARS-CoV-2 target nucleic acids are NOT DETECTED.  The SARS-CoV-2 RNA is generally detectable in upper respiratory specimens during the acute phase of infection. The lowest concentration of SARS-CoV-2 viral copies this assay can detect is 138 copies/mL. A negative result does not preclude SARS-Cov-2 infection and should not be used as the sole basis for treatment or other patient management decisions. A negative result may occur with  improper specimen collection/handling, submission of specimen other than nasopharyngeal swab, presence of viral mutation(s) within the areas targeted by this assay, and inadequate number of viral copies(<138 copies/mL). A negative result must be combined with clinical observations, patient history, and epidemiological information. The expected result is Negative.  Fact Sheet for Patients:  EntrepreneurPulse.com.au  Fact Sheet for Healthcare  Providers:  IncredibleEmployment.be  This test is no t yet approved or cleared by the [place] and  has been authorized for detection and/or diagnosis of SARS-CoV-2 by FDA under an Emergency Use Authorization (EUA). This EUA will remain  in effect (meaning this test can be used) for the duration of the COVID-19 declaration under Section 564(b)(1) of the Act, 21 U.S.C.section 360bbb-3(b)(1), unless the authorization is terminated  or revoked sooner.       Influenza A by PCR NEGATIVE NEGATIVE Final   Influenza B by PCR NEGATIVE NEGATIVE Final    Comment: (NOTE) The Xpert Xpress SARS-CoV-2/FLU/RSV plus assay is intended as an aid in the diagnosis of influenza from Nasopharyngeal swab specimens and should not be used as a sole basis for treatment. Nasal washings and aspirates are unacceptable for Xpert Xpress SARS-CoV-2/FLU/RSV testing.  Fact Sheet for Patients: EntrepreneurPulse.com.au  Fact Sheet for Healthcare Providers: IncredibleEmployment.be  This test is not yet approved or cleared by the [place] FDA and has been authorized for detection and/or diagnosis of SARS-CoV-2 by FDA under an Emergency Use Authorization (EUA). This EUA will remain in effect (meaning this test can be used) for the duration of the COVID-19 declaration under Section 564(b)(1) of the Act, 21 U.S.C. section 360bbb-3(b)(1), unless the authorization is terminated or revoked.  Performed at [facility], [place] [address], [place], [place] [address]   Blood Culture ID Panel (Reflexed)     Status: Abnormal   Collection Time: 09/27/20  2:16 PM  Result Value Ref Range Status   Enterococcus faecalis NOT DETECTED NOT DETECTED Final   Enterococcus Faecium NOT DETECTED NOT DETECTED Final   Listeria monocytogenes NOT DETECTED NOT DETECTED Final   Staphylococcus species DETECTED (A) NOT DETECTED Final    Comment: CRITICAL RESULT CALLED TO,  READ BACK BY AND VERIFIED WITH: C,PIERCE PHARMD '@1009'$  09/28/20 EB    Staphylococcus aureus (BCID) DETECTED (A) NOT DETECTED Final    Comment: Methicillin (oxacillin)-resistant Staphylococcus aureus (MRSA). MRSA is predictably resistant to beta-lactam antibiotics (except ceftaroline). Preferred therapy is vancomycin unless clinically contraindicated. Patient requires contact precautions if  hospitalized. CRITICAL RESULT CALLED TO, READ BACK BY AND VERIFIED WITH: C,PIERCE PHARMD '@1009'$  09/28/20 EB    Staphylococcus epidermidis NOT DETECTED NOT DETECTED Final   Staphylococcus lugdunensis NOT DETECTED NOT DETECTED Final   Streptococcus species NOT DETECTED NOT DETECTED Final   Streptococcus agalactiae NOT DETECTED NOT DETECTED Final   Streptococcus pneumoniae NOT DETECTED NOT DETECTED Final   Streptococcus pyogenes NOT DETECTED NOT DETECTED Final   A.calcoaceticus-baumannii NOT DETECTED NOT DETECTED Final   Bacteroides fragilis NOT DETECTED NOT DETECTED Final   Enterobacterales NOT DETECTED NOT DETECTED Final   Enterobacter cloacae complex NOT DETECTED NOT DETECTED Final   Escherichia coli NOT DETECTED NOT DETECTED Final   Klebsiella aerogenes NOT DETECTED NOT DETECTED Final   Klebsiella oxytoca NOT DETECTED NOT DETECTED Final   Klebsiella pneumoniae NOT DETECTED NOT DETECTED Final   Proteus species NOT DETECTED NOT DETECTED Final   Salmonella species NOT DETECTED NOT DETECTED Final   Serratia marcescens NOT DETECTED NOT DETECTED Final   Haemophilus influenzae NOT DETECTED NOT DETECTED Final   Neisseria meningitidis NOT DETECTED NOT DETECTED Final   Pseudomonas aeruginosa NOT DETECTED NOT DETECTED Final   Stenotrophomonas maltophilia NOT DETECTED NOT DETECTED Final   Candida albicans NOT DETECTED NOT DETECTED Final   Candida auris NOT DETECTED NOT DETECTED Final   Candida glabrata NOT DETECTED NOT DETECTED Final   Candida krusei NOT DETECTED NOT DETECTED  Final   Candida parapsilosis NOT  DETECTED NOT DETECTED Final   Candida tropicalis NOT DETECTED NOT DETECTED Final   Cryptococcus neoformans/gattii NOT DETECTED NOT DETECTED Final   Meth resistant mecA/C and MREJ DETECTED (A) NOT DETECTED Final    Comment: CRITICAL RESULT CALLED TO, READ BACK BY AND VERIFIED WITH: C,PIERCE PHARMD '@1009'$  09/28/20 EB Performed at [place] [address], [place], [place] [address]   Blood Culture (routine x 2)     Status: Abnormal   Collection Time: 09/27/20  2:29 PM   Specimen: BLOOD  Result Value Ref Range Status   Specimen Description BLOOD SITE NOT SPECIFIED  Final   Special Requests   Final    BOTTLES DRAWN AEROBIC AND ANAEROBIC Blood Culture results may not be optimal due to an inadequate volume of blood received in culture bottles   Culture  Setup Time   Final    GRAM POSITIVE COCCI IN CLUSTERS ANAEROBIC BOTTLE ONLY CRITICAL VALUE NOTED.  VALUE IS CONSISTENT WITH PREVIOUSLY REPORTED AND CALLED VALUE.    Culture (A)  Final    STAPHYLOCOCCUS AUREUS SUSCEPTIBILITIES PERFORMED ON PREVIOUS CULTURE WITHIN THE LAST 5 DAYS. Performed at [facility], [place] [address], [place], [place] [address]    Report Status 10/01/2020 FINAL  Final  Culture, blood (routine x 2)     Status: None   Collection Time: 09/30/20  6:04 AM   Specimen: BLOOD RIGHT FOREARM  Result Value Ref Range Status   Specimen Description BLOOD RIGHT FOREARM  Final   Special Requests   Final    BOTTLES DRAWN AEROBIC AND ANAEROBIC Blood Culture adequate volume   Culture   Final    NO GROWTH 5 DAYS Performed at [facility], [place] [address], [place], [place] [address]    Report Status 10/05/2020 FINAL  Final  Culture, blood (routine x 2)     Status: Abnormal   Collection Time: 09/30/20  6:08 AM   Specimen: BLOOD RIGHT HAND  Result Value Ref Range Status   Specimen Description BLOOD RIGHT HAND  Final   Special Requests   Final    BOTTLES DRAWN AEROBIC AND ANAEROBIC Blood Culture results may not be  optimal due to an inadequate volume of blood received in culture bottles   Culture  Setup Time   Final    GRAM POSITIVE COCCI IN CLUSTERS ANAEROBIC BOTTLE ONLY CRITICAL VALUE NOTED.  VALUE IS CONSISTENT WITH PREVIOUSLY REPORTED AND CALLED VALUE.    Culture (A)  Final    STAPHYLOCOCCUS AUREUS SUSCEPTIBILITIES PERFORMED ON PREVIOUS CULTURE WITHIN THE LAST 5 DAYS. Performed at [facility], [place] [address], [place], [place] [address]    Report Status 10/03/2020 FINAL  Final  Culture, blood (routine x 2)     Status: None   Collection Time: 10/03/20  2:42 AM   Specimen: BLOOD  Result Value Ref Range Status   Specimen Description BLOOD LEFT ARM  Final   Special Requests   Final    BOTTLES DRAWN AEROBIC AND ANAEROBIC Blood Culture results may not be optimal due to an inadequate volume of blood received in culture bottles   Culture   Final    NO GROWTH 5 DAYS Performed at [facility], [place] [address] [place], [place] [address]    Report Status 10/08/2020 FINAL  Final  Culture, blood (routine x 2)     Status: None   Collection Time: 10/03/20  2:42 AM  Specimen: BLOOD  Result Value Ref Range Status   Specimen Description BLOOD LEFT HAND  Final   Special Requests   Final    BOTTLES DRAWN AEROBIC ONLY Blood Culture results may not be optimal due to an inadequate volume of blood received in culture bottles   Culture   Final    NO GROWTH 5 DAYS Performed at [facility], [place] [address] [place], [place] [address]    Report Status 10/08/2020 FINAL  Final  Surgical pcr screen     Status: Abnormal   Collection Time: 10/11/20  6:44 PM   Specimen: Nasal Mucosa; Nasal Swab  Result Value Ref Range Status   MRSA, PCR POSITIVE (A) NEGATIVE Final    Comment: RESULT CALLED TO, READ BACK BY AND VERIFIED WITH: F COMBRES RN 10/11/20 2339 JDW    Staphylococcus aureus POSITIVE (A) NEGATIVE Final    Comment: (NOTE) The Xpert SA Assay (FDA approved for NASAL specimens in patients  44 years of age and older), is one component of a comprehensive surveillance program. It is not intended to diagnose infection nor to guide or monitor treatment. Performed at [facility], [place] [address], [place], [place] [address]   Culture, blood (routine x 2)     Status: None (Preliminary result)   Collection Time: 10/13/20 10:43 PM   Specimen: BLOOD  Result Value Ref Range Status   Specimen Description BLOOD RIGHT THUMB  Final   Special Requests   Final    BOTTLES DRAWN AEROBIC AND ANAEROBIC Blood Culture adequate volume   Culture   Final    NO GROWTH 3 DAYS Performed at [facility], [place] [address], [place], [place] [address]    Report Status PENDING  Incomplete  Culture, blood (routine x 2)     Status: None (Preliminary result)   Collection Time: 10/13/20 10:43 PM   Specimen: BLOOD RIGHT HAND  Result Value Ref Range Status   Specimen Description BLOOD RIGHT HAND  Final   Special Requests   Final    BOTTLES DRAWN AEROBIC AND ANAEROBIC Blood Culture adequate volume   Culture   Final    NO GROWTH 3 DAYS Performed at [facility], [place] [address] [place], [place] [address]    Report Status PENDING  Incomplete  Aerobic/Anaerobic Culture w Gram Stain (surgical/deep wound)     Status: None (Preliminary result)   Collection Time: 10/14/20 10:38 AM   Specimen: Soft Tissue, Other  Result Value Ref Range Status   Specimen Description TISSUE MITRAL VALVE VEGETATION SPEC A  Final   Special Requests MITRAL VALVE VEGETATION  Final   Gram Stain   Final    WBC PRESENT,BOTH PMN AND MONONUCLEAR NO ORGANISMS SEEN    Culture   Final    NO GROWTH 3 DAYS NO ANAEROBES ISOLATED; CULTURE IN PROGRESS FOR 5 DAYS Performed at [facility], [place] [address], [place],  [address]    Report Status PENDING  Incomplete    Coagulation Studies: No results for input(s): LABPROT, INR in the last 72 hours.  Urinalysis: No results for input(s): COLORURINE, LABSPEC,  PHURINE, GLUCOSEU, HGBUR, BILIRUBINUR, KETONESUR, PROTEINUR, UROBILINOGEN, NITRITE, LEUKOCYTESUR in the last 72 hours.  Invalid input(s): APPERANCEUR    Imaging: DG Chest Port 1 View  Result Date: 10/17/2020 CLINICAL DATA:  Status post mitral valve replacement. EXAM: PORTABLE CHEST 1 VIEW COMPARISON:  October 16, 2020 FINDINGS: A left IJ sheath and chest tubes remain in place.  There is a right-sided pleural effusion with underlying atelectasis. Probable atelectasis in the left base. The cardiomediastinal silhouette is stable. No pneumothorax. Tumoral calcinosis identified in the right shoulder. IMPRESSION: No interval change in support apparatus. No pneumothorax. Continued right effusion and bibasilar atelectasis. Electronically Signed   By: Dorise Bullion III M.D   On: 10/17/2020 08:08     Medications:   . sodium chloride    . sodium chloride    . sodium chloride     . aspirin EC  325 mg Oral Daily  . calcium acetate  667 mg Oral TID WC  . cinacalcet  30 mg Oral Q breakfast  . cloNIDine  0.3 mg Transdermal Weekly  . docusate sodium  200 mg Oral Daily  . doxercalciferol  3 mcg Intravenous Q M,W,F-HD  . feeding supplement (NEPRO CARB STEADY)  237 mL Oral BID BM  . gabapentin  200 mg Oral QHS  . influenza vac split quadrivalent PF  0.5 mL Intramuscular Tomorrow-1000  . metoprolol tartrate  50 mg Oral BID  . multivitamin  1 tablet Oral QHS  . pantoprazole  40 mg Oral Daily  . sodium chloride flush  3 mL Intravenous Q12H   sodium chloride, sodium chloride, sodium chloride, acetaminophen, camphor-menthol, diphenhydrAMINE, lidocaine (PF), lidocaine-prilocaine, ondansetron **OR** ondansetron (ZOFRAN) IV, oxyCODONE, pentafluoroprop-tetrafluoroeth, senna-docusate, sodium chloride flush, traMADol  Assessment/ Plan:  OP HD:MWF [facility] 4h 350/500 new edw here 15- 75kg (was 82.5kg)2/2.5 bath LUA AVG   Hep 5000 -Aranesp 68mgIVq 2wks - last 09/27/20 (Verified order) -Hectorol366m IV  qHD   Assessment/Plan: 1. 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 Doing well.   2. Volumeexcess - resolved.  Dialysis planned with ultrafiltration 10/18/2020 3. HTN - continues on clonidine patch and metoprolol 25 bid 4. 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 S/Ptooth extraction3/8/22.Per ID to get 6 wks IV vanc w/ new end date of 11/25/20.  5. ESRD - usual HD MWF.  Next dialysis 10/18/2020 6. Anemiaof CKD-HGB 9- 10 range. S/P 2 units of PRBCs 3/10.  7. Secondary Hyperparathyroidism - sensipar, binders. Continue hect 3ug tiw 8. Hx Hep C /liver cirrhosis.    LOS: 21[place]'@TODAY'D'@7'$ :03 AM

## 2020-10-18 NOTE — Addendum Note (Signed)
Addendum  created 10/18/20 0900 by Josephine Igo, CRNA   Order list changed, Pharmacy for encounter modified

## 2020-10-18 NOTE — Progress Notes (Addendum)
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 Transferred from ICU to 4E yesterday.  No new concerns.  Walked in the hall yesterday.   He has had a BM.   Objective: Vital signs in last 24 hours: Temp:  [97.6 F (36.4 C)-98.7 F (37.1 C)] 98.7 F (37.1 C) (03/14 0529) Pulse Rate:  [75-110] 80 (03/14 0529) Cardiac Rhythm: Heart block (03/13 1900) Resp:  [12-24] 12 (03/14 0529) BP: (113-161)/(86-117) 161/102 (03/14 0529) SpO2:  [96 %-100 %] 97 % (03/14 0529) Weight:  [78.2 kg] 78.2 kg (03/14 0500)  Filed Weights   10/16/20 0600 10/17/20 0500 10/18/20 0500  Weight: 80.7 kg 76.4 kg 78.2 kg    Weight change: 1.8 kg   Hemodynamic parameters for last 24 hours:    Intake/Output from previous day: 03/13 0701 - 03/14 0700 In: 360 [P.O.:360] Out: -   Intake/Output this shift: No intake/output data recorded.  Current Meds: Scheduled Meds: . aspirin EC  325 mg Oral Daily  . calcium acetate  667 mg Oral TID WC  . cinacalcet  30 mg Oral Q breakfast  . cloNIDine  0.3 mg Transdermal Weekly  . docusate sodium  200 mg Oral Daily  . doxercalciferol  3 mcg Intravenous Q M,W,F-HD  . feeding supplement (NEPRO CARB STEADY)  237 mL Oral BID BM  . gabapentin  200 mg Oral QHS  . influenza vac split quadrivalent PF  0.5 mL Intramuscular Tomorrow-1000  . metoprolol tartrate  50 mg Oral BID  . multivitamin  1 tablet Oral QHS  . pantoprazole  40 mg Oral Daily  . sodium chloride flush  3 mL Intravenous Q12H   Continuous Infusions: . sodium chloride    . sodium chloride    . sodium chloride     PRN Meds:.sodium chloride, sodium  chloride, sodium chloride, acetaminophen, camphor-menthol, diphenhydrAMINE, lidocaine (PF), lidocaine-prilocaine, ondansetron **OR** ondansetron (ZOFRAN) IV, oxyCODONE, pentafluoroprop-tetrafluoroeth, senna-docusate, sodium chloride flush, traMADol  General appearance: alert, cooperative and moderate distress Neurologic: intact Heart: SR with 1st degree AV block. Lungs: Breath sounds are full and equal. Chest tubes are out.  Abdomen: soft, NT. Extremities: All warm and well perfused. Left femoral cannulation sites are soft and dry.  Wound: The right chest incision is intact and dry. Lab Results: CBC: Recent Labs    10/15/20 1641 10/16/20 0324  WBC 16.0* 15.5*  HGB 9.9* 9.6*  HCT 31.2* 29.4*  PLT 167 160   BMET:  Recent Labs    10/16/20 0324 10/18/20 0148  NA 134* 127*  K 4.1 4.3  CL 97* 93*  CO2 26 25  GLUCOSE 99 80  BUN 17 28*  CREATININE 4.46* 5.37*  CALCIUM 8.8* 8.1*    CMET: Lab Results  Component Value Date   WBC 15.5 (H) 10/16/2020   HGB 9.6 (L) 10/16/2020   HCT 29.4 (L) 10/16/2020   PLT 160 10/16/2020   GLUCOSE 80 10/18/2020   CHOL 126 09/29/2020   TRIG 342 (H) 09/29/2020   HDL <10 (L) 09/29/2020  LDLDIRECT 25.4 09/30/2020   LDLCALC NOT CALCULATED 09/29/2020   ALT 10 10/14/2020   AST 35 10/14/2020   NA 127 (L) 10/18/2020   K 4.3 10/18/2020   CL 93 (L) 10/18/2020   CREATININE 5.37 (H) 10/18/2020   BUN 28 (H) 10/18/2020   CO2 25 10/18/2020   TSH 2.006 08/28/2013   INR 1.5 (H) 10/14/2020   HGBA1C 5.5 09/29/2020      PT/INR:  No results for input(s): LABPROT, INR in the last 72 hours. Radiology: No results found.   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 Remains  Hypertensive, adding metoprolol '50mg'$  BP BID today.  Continue Vanc through 11/14/20 per ID. Remove pacer wires today.   -ESRD / Volume excess / HTN- HD on POD1 and 2. Now at pre-op WT. Back on regular M-W-F HD schedule.   -Expected acute blood loss anemia on anemia of CKD- HCT stable.  -History of Hepatitis C with cirrhosis- ID planning outpatient follow up.   -Multiple dental caries and periodontitis- s/p multiple extractions on 3/8.  -DVT prophylaxis- encouraging mobility       Antony Odea, PA-C [phone] 10/18/2020 7:41 AM    I have seen and examined the patient and agree with the assessment and plan as outlined.  Overall doing remarkably well under the circumstances.  No surgical issues and maintaining NSR w/ stable BP.  Will increase metoprolol to 50 mg bid and continue clonidine patch for now w/ plans to resume oral clonidine prior to hospital D/C.  Will defer remainder of his care to the Infectious Disease, Nephrology and Hospitalist teams.  D/C planning and management of social determinants of care will be critical for this patient's long-term prognosis.   Rexene Alberts, MD 10/18/2020 8:57 AM

## 2020-10-18 NOTE — Progress Notes (Signed)
EPW removed per order. Tips intact. Tolerated well. VSS. Pt educated on 1 hour bedrest. Call light in reach.  Clyde Canterbury, RN

## 2020-10-18 NOTE — Addendum Note (Signed)
Addendum  created 10/18/20 0916 by Josephine Igo, CRNA   Order list changed

## 2020-10-18 NOTE — Addendum Note (Signed)
Addendum  created 10/18/20 0902 by Josephine Igo, CRNA   Order list changed, Pharmacy for encounter modified

## 2020-10-18 NOTE — Progress Notes (Signed)
Pt received from HD. VS stable, call light in reach, will continue to monitor.   Chrisandra Carota, RN 10/18/2020

## 2020-10-18 NOTE — Progress Notes (Signed)
Came to ambulate however pt feeling washed out after HD. Sts he usually feels better in the evening. Encouraged pt to walk with evening staff and he obliged.  [place], ACSM 3:01 PM 10/18/2020

## 2020-10-19 ENCOUNTER — Inpatient Hospital Stay (HOSPITAL_COMMUNITY): Payer: Medicaid Other

## 2020-10-19 DIAGNOSIS — I132 Hypertensive heart and chronic kidney disease with heart failure and with stage 5 chronic kidney disease, or end stage renal disease: Secondary | ICD-10-CM | POA: Diagnosis not present

## 2020-10-19 DIAGNOSIS — R531 Weakness: Secondary | ICD-10-CM | POA: Diagnosis not present

## 2020-10-19 DIAGNOSIS — N186 End stage renal disease: Secondary | ICD-10-CM | POA: Diagnosis not present

## 2020-10-19 DIAGNOSIS — I639 Cerebral infarction, unspecified: Secondary | ICD-10-CM | POA: Diagnosis not present

## 2020-10-19 DIAGNOSIS — I5032 Chronic diastolic (congestive) heart failure: Secondary | ICD-10-CM | POA: Diagnosis not present

## 2020-10-19 DIAGNOSIS — M79604 Pain in right leg: Secondary | ICD-10-CM | POA: Diagnosis not present

## 2020-10-19 DIAGNOSIS — A419 Sepsis, unspecified organism: Secondary | ICD-10-CM | POA: Diagnosis not present

## 2020-10-19 DIAGNOSIS — I6389 Other cerebral infarction: Secondary | ICD-10-CM | POA: Diagnosis not present

## 2020-10-19 DIAGNOSIS — Z992 Dependence on renal dialysis: Secondary | ICD-10-CM | POA: Diagnosis not present

## 2020-10-19 DIAGNOSIS — I6310 Cerebral infarction due to embolism of unspecified precerebral artery: Secondary | ICD-10-CM | POA: Diagnosis not present

## 2020-10-19 DIAGNOSIS — J189 Pneumonia, unspecified organism: Secondary | ICD-10-CM | POA: Diagnosis not present

## 2020-10-19 DIAGNOSIS — N2581 Secondary hyperparathyroidism of renal origin: Secondary | ICD-10-CM | POA: Diagnosis not present

## 2020-10-19 DIAGNOSIS — D631 Anemia in chronic kidney disease: Secondary | ICD-10-CM | POA: Diagnosis not present

## 2020-10-19 DIAGNOSIS — I10 Essential (primary) hypertension: Secondary | ICD-10-CM | POA: Diagnosis not present

## 2020-10-19 DIAGNOSIS — I330 Acute and subacute infective endocarditis: Secondary | ICD-10-CM | POA: Diagnosis not present

## 2020-10-19 DIAGNOSIS — R0602 Shortness of breath: Secondary | ICD-10-CM | POA: Diagnosis not present

## 2020-10-19 DIAGNOSIS — Q612 Polycystic kidney, adult type: Secondary | ICD-10-CM | POA: Diagnosis not present

## 2020-10-19 DIAGNOSIS — M7989 Other specified soft tissue disorders: Secondary | ICD-10-CM | POA: Diagnosis not present

## 2020-10-19 LAB — CULTURE, BLOOD (ROUTINE X 2)
Culture: NO GROWTH
Culture: NO GROWTH
Special Requests: ADEQUATE
Special Requests: ADEQUATE

## 2020-10-19 LAB — AEROBIC/ANAEROBIC CULTURE W GRAM STAIN (SURGICAL/DEEP WOUND): Culture: NO GROWTH

## 2020-10-19 IMAGING — CR DG TIBIA/FIBULA 2V*R*
3 series · 3 of 3 positions shown · non-contrast
Comparison: [DATE]

CLINICAL DATA: Right anterior lower leg swelling 1.5 weeks. No
history of injury.

EXAM:
RIGHT TIBIA AND FIBULA - 2 VIEW

[tibia ap (1 of 2)]
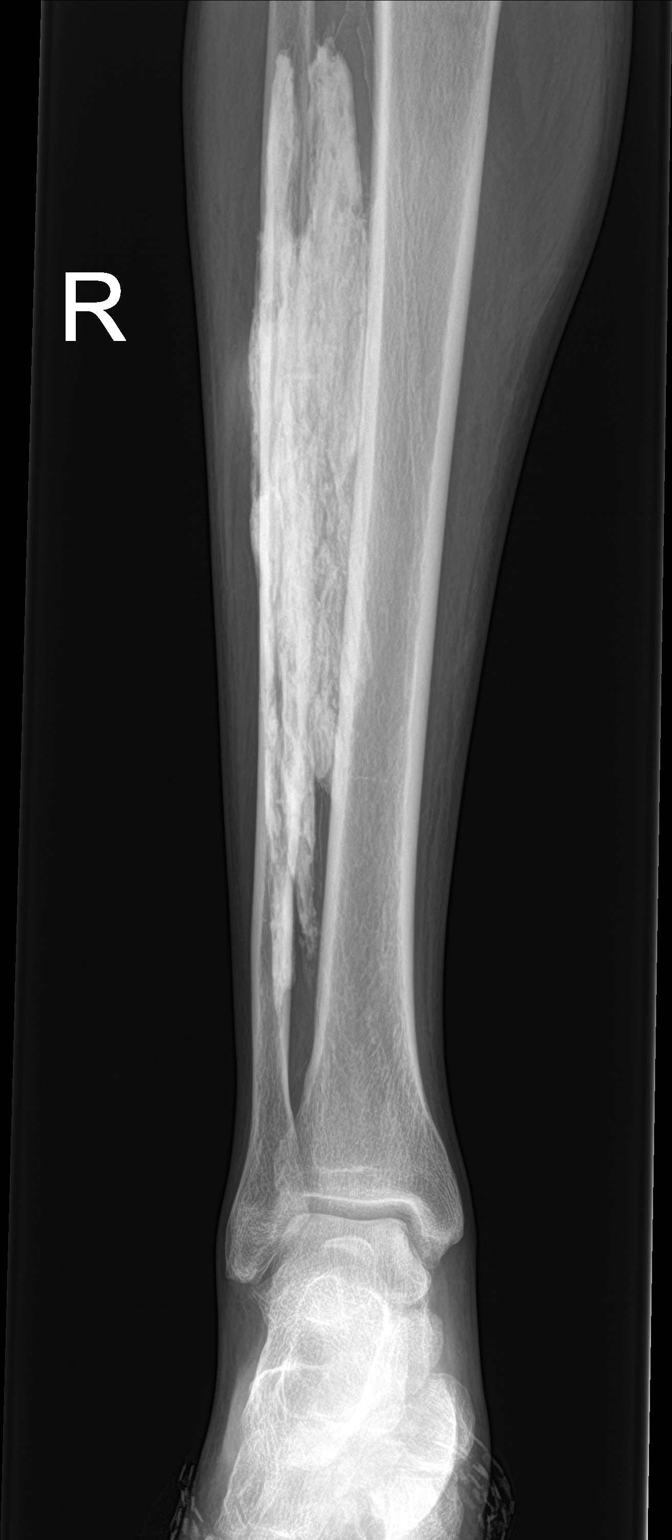

[tibia ap (2 of 2)]
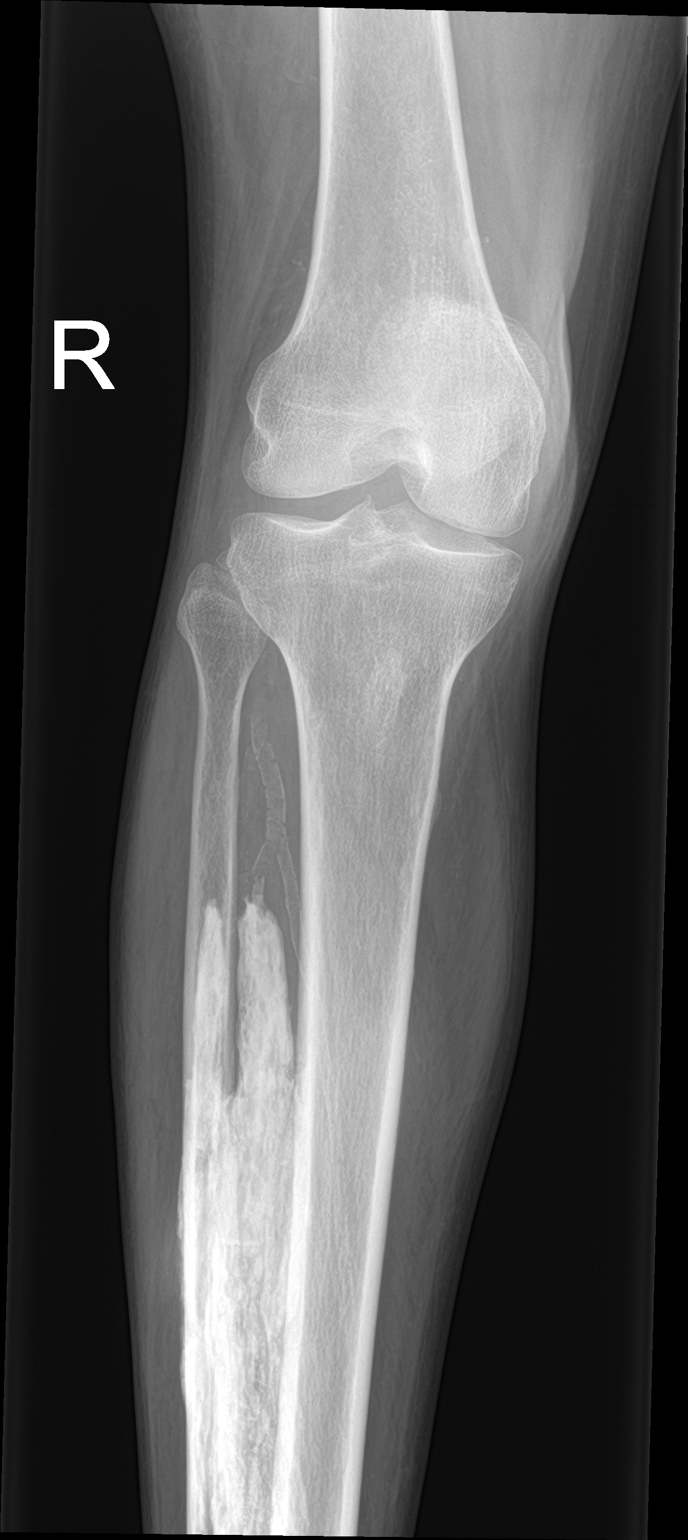

[tibia lat]
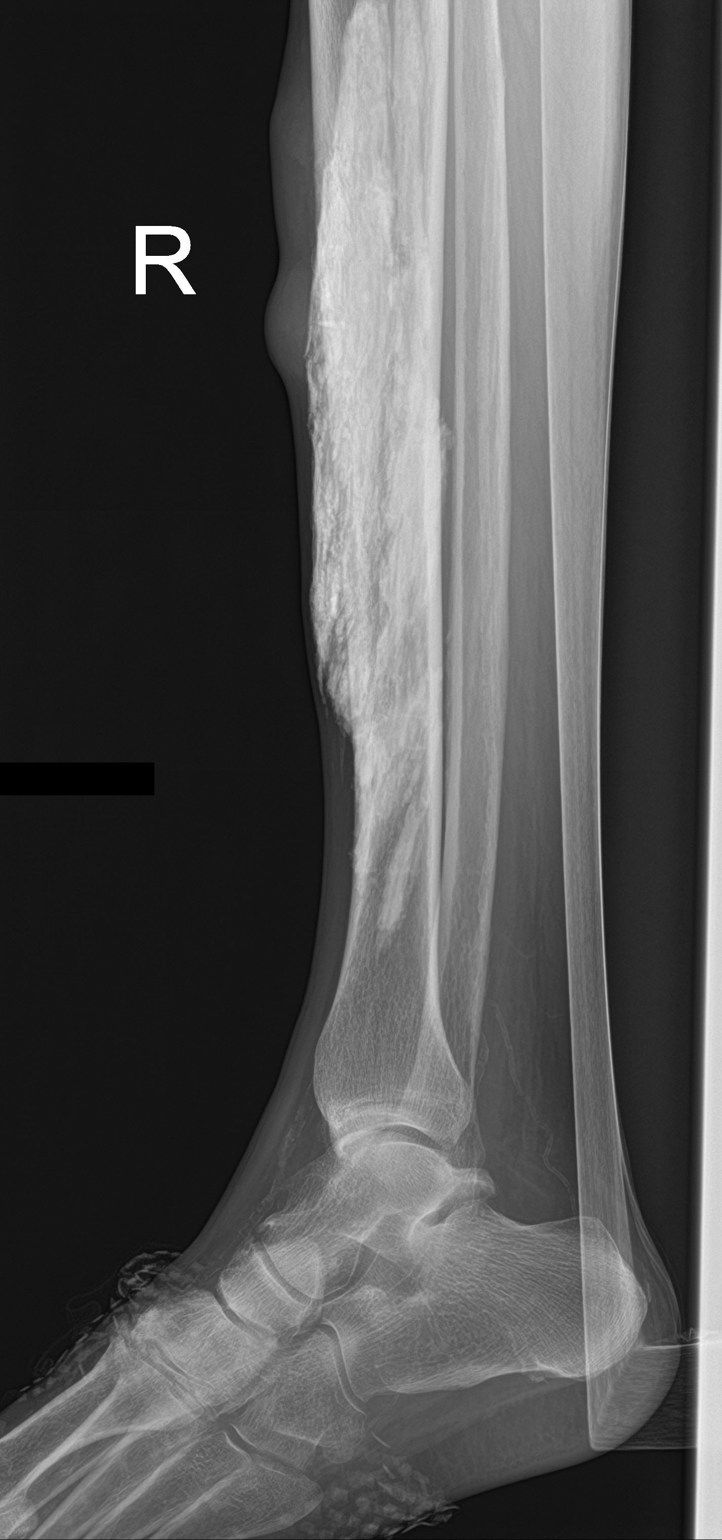

[3 of 3 positions shown; findings below may reference images not displayed]

FINDINGS: Negative for fracture.

Extensive ossification in the soft tissues between the tibia and
fibula anteriorly. This is unchanged from the prior study and likely
is dystrophic calcification from prior injury. Possible muscle
ossification.

There is nodular soft tissue swelling of the soft tissues anterior
to the proximal tibia question injury or infection. This was not
present previously

Arterial calcification
IMPRESSION: Chronic soft tissue ossification medial to the tibia anteriorly
unchanged from the prior study. Likely muscle ossification.

New areas of nodular soft tissue swelling anterior to the proximal
tibia. Question injury or infection.

No acute fracture.

## 2020-10-19 MED ORDER — CHLORHEXIDINE GLUCONATE CLOTH 2 % EX PADS
6.0000 | MEDICATED_PAD | Freq: Every day | CUTANEOUS | Status: DC
  Administered 2020-10-19 – 2020-11-07 (×12): 6 via TOPICAL

## 2020-10-19 NOTE — Progress Notes (Signed)
[facility] ROUNDING NOTE   Subjective:   Brief history: Status post minimally invasive mitral valve replacement 10/14/2020.  End-stage renal disease Monday Wednesday Friday dialysis.  Next dialysis treatment planned for 10/18/2020.  His  last treatment 2 L removed 10/18/2020 next treatment be 10/20/2020  Blood pressure 123/84 pulse 82 temperature 97.8 O2 sats 90% room air  Sodium 127 potassium 4.3 chloride 93 CO2 25 BUN 28 creatinine 5.37 glucose 80 hemoglobin 9.1  Objective:  Vital signs in last 24 hours:  Temp:  [97.8 F (36.6 C)-98.8 F (37.1 C)] 97.8 F (36.6 C) (03/15 0500) Pulse Rate:  [84-94] 85 (03/15 0500) Resp:  [12-30] 15 (03/15 0500) BP: (108-162)/(75-123) 123/84 (03/15 0500) SpO2:  [95 %-100 %] 100 % (03/15 0500) Weight:  [76 kg-76.6 kg] 76.6 kg (03/15 0500)  Weight change: -2.2 kg Filed Weights   10/18/20 0500 10/18/20 1157 10/19/20 0500  Weight: 78.2 kg 76 kg 76.6 kg    Intake/Output: I/O last 3 completed shifts: In: [id number] [P.O.:1140; I.V.:3] Out: 2000 [Other:2000]   Intake/Output this shift:  No intake/output data recorded.  Gen: alert, calm today Heart:S1,S2 now with mitral click  Lungs: clear bilat, no wheezing Abdomen:OG in place. No BS.  Ext: +1-2 bilat LE edema. Dialysis Access:L AVG + Bruit   Basic Metabolic Panel: Recent Labs  Lab 10/14/20 0406 10/14/20 0818 10/14/20 2022 10/14/20 2354 10/15/20 0406 10/15/20 0622 10/15/20 1641 10/16/20 0324 10/18/20 0148  NA 132*   < > 135   < > 134* 136 134* 134* 127*  K 4.6   < > 5.7*   < > 5.0 5.0 3.7 4.1 4.3  CL 94*   < > 104  --  101  --  98 97* 93*  CO2 28  --  19*  --  18*  --  '24 26 25  '$ GLUCOSE 87   < > 95  --  122*  --  125* 99 80  BUN 19   < > 26*  --  30*  --  12 17 28*  CREATININE 5.60*   < > 5.45*  --  6.07*  --  3.34* 4.46* 5.37*  CALCIUM 9.1  --  7.7*  --  7.9*  --  8.4* 8.8* 8.1*  MG 2.1  --  2.6*  --  2.6*  --  2.0  --   --   PHOS 5.8*  --   --   --   --   --   --    --   --    < > = values in this interval not displayed.    Liver Function Tests: Recent Labs  Lab 10/13/20 0145 10/14/20 0406  AST 27 35  ALT 12 10  ALKPHOS 52 46  BILITOT 1.0 0.8  PROT 8.1 8.7*  ALBUMIN 2.3* 2.4*   No results for input(s): LIPASE, AMYLASE in the last 168 hours. No results for input(s): AMMONIA in the last 168 hours.  CBC: Recent Labs  Lab 10/14/20 2022 10/14/20 2354 10/15/20 0406 10/15/20 0622 10/15/20 1641 10/16/20 0324 10/18/20 0833  WBC 13.9*  --  14.2*  --  16.0* 15.5* 13.3*  HGB 9.2*   < > 9.4* 9.5* 9.9* 9.6* 9.1*  HCT 28.0*   < > 29.7* 28.0* 31.2* 29.4* 29.4*  MCV 87.0  --  88.9  --  87.4 88.0 91.3  PLT 122*  --  140*  --  167 160 211   < > = values in this interval  not displayed.    Cardiac Enzymes: No results for input(s): CKTOTAL, CKMB, CKMBINDEX, TROPONINI in the last 168 hours.  BNP: Invalid input(s): POCBNP  CBG: Recent Labs  Lab 10/16/20 1529 10/16/20 2134 10/17/20 0625 10/17/20 0807 10/17/20 1158  GLUCAP 105* 111* 82 76 84    Microbiology: Results for orders placed or performed during the hospital encounter of 09/27/20  Blood Culture (routine x 2)     Status: Abnormal   Collection Time: 09/27/20  2:16 PM   Specimen: BLOOD RIGHT HAND  Result Value Ref Range Status   Specimen Description BLOOD RIGHT HAND  Final   Special Requests   Final    BOTTLES DRAWN AEROBIC AND ANAEROBIC Blood Culture results may not be optimal due to an inadequate volume of blood received in culture bottles Performed at [facility], [place] [address] [place], [place] [address]    Culture  Setup Time   Final    GRAM POSITIVE COCCI IN CLUSTERS IN BOTH AEROBIC AND ANAEROBIC BOTTLES    Culture METHICILLIN RESISTANT STAPHYLOCOCCUS AUREUS (A)  Final   Report Status 09/30/2020 FINAL  Final   Organism ID, Bacteria METHICILLIN RESISTANT STAPHYLOCOCCUS AUREUS  Final      Susceptibility   Methicillin resistant staphylococcus aureus - MIC*     CIPROFLOXACIN >=8 RESISTANT Resistant     ERYTHROMYCIN >=8 RESISTANT Resistant     GENTAMICIN <=0.5 SENSITIVE Sensitive     OXACILLIN >=4 RESISTANT Resistant     TETRACYCLINE <=1 SENSITIVE Sensitive     VANCOMYCIN <=0.5 SENSITIVE Sensitive     TRIMETH/SULFA <=10 SENSITIVE Sensitive     CLINDAMYCIN <=0.25 SENSITIVE Sensitive     RIFAMPIN <=0.5 SENSITIVE Sensitive     Inducible Clindamycin NEGATIVE Sensitive     * METHICILLIN RESISTANT STAPHYLOCOCCUS AUREUS  Resp Panel by RT-PCR (Flu A&B, Covid) Nasopharyngeal Swab     Status: None   Collection Time: 09/27/20  2:16 PM   Specimen: Nasopharyngeal Swab; Nasopharyngeal(NP) swabs in vial transport medium  Result Value Ref Range Status   SARS Coronavirus 2 by RT PCR NEGATIVE NEGATIVE Final    Comment: (NOTE) SARS-CoV-2 target nucleic acids are NOT DETECTED.  The SARS-CoV-2 RNA is generally detectable in upper respiratory specimens during the acute phase of infection. The lowest concentration of SARS-CoV-2 viral copies this assay can detect is 138 copies/mL. A negative result does not preclude SARS-Cov-2 infection and should not be used as the sole basis for treatment or other patient management decisions. A negative result may occur with  improper specimen collection/handling, submission of specimen other than nasopharyngeal swab, presence of viral mutation(s) within the areas targeted by this assay, and inadequate number of viral copies(<138 copies/mL). A negative result must be combined with clinical observations, patient history, and epidemiological information. The expected result is Negative.  Fact Sheet for Patients:  EntrepreneurPulse.com.au  Fact Sheet for Healthcare Providers:  IncredibleEmployment.be  This test is no t yet approved or cleared by the [place] FDA and  has been authorized for detection and/or diagnosis of SARS-CoV-2 by FDA under an Emergency Use Authorization (EUA). This  EUA will remain  in effect (meaning this test can be used) for the duration of the COVID-19 declaration under Section 564(b)(1) of the Act, 21 U.S.C.section 360bbb-3(b)(1), unless the authorization is terminated  or revoked sooner.       Influenza A by PCR NEGATIVE NEGATIVE Final   Influenza B by PCR NEGATIVE NEGATIVE Final    Comment: (NOTE) The Xpert Xpress SARS-CoV-2/FLU/RSV  plus assay is intended as an aid in the diagnosis of influenza from Nasopharyngeal swab specimens and should not be used as a sole basis for treatment. Nasal washings and aspirates are unacceptable for Xpert Xpress SARS-CoV-2/FLU/RSV testing.  Fact Sheet for Patients: EntrepreneurPulse.com.au  Fact Sheet for Healthcare Providers: IncredibleEmployment.be  This test is not yet approved or cleared by the [place] FDA and has been authorized for detection and/or diagnosis of SARS-CoV-2 by FDA under an Emergency Use Authorization (EUA). This EUA will remain in effect (meaning this test can be used) for the duration of the COVID-19 declaration under Section 564(b)(1) of the Act, 21 U.S.C. section 360bbb-3(b)(1), unless the authorization is terminated or revoked.  Performed at [facility], [place] [address], [place], [place] [address]   Blood Culture ID Panel (Reflexed)     Status: Abnormal   Collection Time: 09/27/20  2:16 PM  Result Value Ref Range Status   Enterococcus faecalis NOT DETECTED NOT DETECTED Final   Enterococcus Faecium NOT DETECTED NOT DETECTED Final   Listeria monocytogenes NOT DETECTED NOT DETECTED Final   Staphylococcus species DETECTED (A) NOT DETECTED Final    Comment: CRITICAL RESULT CALLED TO, READ BACK BY AND VERIFIED WITH: C,PIERCE PHARMD '@1009'$  09/28/20 EB    Staphylococcus aureus (BCID) DETECTED (A) NOT DETECTED Final    Comment: Methicillin (oxacillin)-resistant Staphylococcus aureus (MRSA). MRSA is predictably resistant to beta-lactam  antibiotics (except ceftaroline). Preferred therapy is vancomycin unless clinically contraindicated. Patient requires contact precautions if  hospitalized. CRITICAL RESULT CALLED TO, READ BACK BY AND VERIFIED WITH: C,PIERCE PHARMD '@1009'$  09/28/20 EB    Staphylococcus epidermidis NOT DETECTED NOT DETECTED Final   Staphylococcus lugdunensis NOT DETECTED NOT DETECTED Final   Streptococcus species NOT DETECTED NOT DETECTED Final   Streptococcus agalactiae NOT DETECTED NOT DETECTED Final   Streptococcus pneumoniae NOT DETECTED NOT DETECTED Final   Streptococcus pyogenes NOT DETECTED NOT DETECTED Final   A.calcoaceticus-baumannii NOT DETECTED NOT DETECTED Final   Bacteroides fragilis NOT DETECTED NOT DETECTED Final   Enterobacterales NOT DETECTED NOT DETECTED Final   Enterobacter cloacae complex NOT DETECTED NOT DETECTED Final   Escherichia coli NOT DETECTED NOT DETECTED Final   Klebsiella aerogenes NOT DETECTED NOT DETECTED Final   Klebsiella oxytoca NOT DETECTED NOT DETECTED Final   Klebsiella pneumoniae NOT DETECTED NOT DETECTED Final   Proteus species NOT DETECTED NOT DETECTED Final   Salmonella species NOT DETECTED NOT DETECTED Final   Serratia marcescens NOT DETECTED NOT DETECTED Final   Haemophilus influenzae NOT DETECTED NOT DETECTED Final   Neisseria meningitidis NOT DETECTED NOT DETECTED Final   Pseudomonas aeruginosa NOT DETECTED NOT DETECTED Final   Stenotrophomonas maltophilia NOT DETECTED NOT DETECTED Final   Candida albicans NOT DETECTED NOT DETECTED Final   Candida auris NOT DETECTED NOT DETECTED Final   Candida glabrata NOT DETECTED NOT DETECTED Final   Candida krusei NOT DETECTED NOT DETECTED Final   Candida parapsilosis NOT DETECTED NOT DETECTED Final   Candida tropicalis NOT DETECTED NOT DETECTED Final   Cryptococcus neoformans/gattii NOT DETECTED NOT DETECTED Final   Meth resistant mecA/C and MREJ DETECTED (A) NOT DETECTED Final    Comment: CRITICAL RESULT CALLED TO,  READ BACK BY AND VERIFIED WITH: C,PIERCE PHARMD '@1009'$  09/28/20 EB Performed at [facility], [address], [place], [place] [address]   Blood Culture (routine x 2)     Status: Abnormal   Collection Time: 09/27/20  2:29 PM   Specimen: BLOOD  Result Value Ref  Range Status   Specimen Description BLOOD SITE NOT SPECIFIED  Final   Special Requests   Final    BOTTLES DRAWN AEROBIC AND ANAEROBIC Blood Culture results may not be optimal due to an inadequate volume of blood received in culture bottles   Culture  Setup Time   Final    GRAM POSITIVE COCCI IN CLUSTERS ANAEROBIC BOTTLE ONLY CRITICAL VALUE NOTED.  VALUE IS CONSISTENT WITH PREVIOUSLY REPORTED AND CALLED VALUE.    Culture (A)  Final    STAPHYLOCOCCUS AUREUS SUSCEPTIBILITIES PERFORMED ON PREVIOUS CULTURE WITHIN THE LAST 5 DAYS. Performed at [facility], [place] [address], [place], [place] [address]    Report Status 10/01/2020 FINAL  Final  Culture, blood (routine x 2)     Status: None   Collection Time: 09/30/20  6:04 AM   Specimen: BLOOD RIGHT FOREARM  Result Value Ref Range Status   Specimen Description BLOOD RIGHT FOREARM  Final   Special Requests   Final    BOTTLES DRAWN AEROBIC AND ANAEROBIC Blood Culture adequate volume   Culture   Final    NO GROWTH 5 DAYS Performed at [facility], [place] [address], [place], [place] [address]    Report Status 10/05/2020 FINAL  Final  Culture, blood (routine x 2)     Status: Abnormal   Collection Time: 09/30/20  6:08 AM   Specimen: BLOOD RIGHT HAND  Result Value Ref Range Status   Specimen Description BLOOD RIGHT HAND  Final   Special Requests   Final    BOTTLES DRAWN AEROBIC AND ANAEROBIC Blood Culture results may not be optimal due to an inadequate volume of blood received in culture bottles   Culture  Setup Time   Final    GRAM POSITIVE COCCI IN CLUSTERS ANAEROBIC BOTTLE ONLY CRITICAL VALUE NOTED.  VALUE IS CONSISTENT WITH PREVIOUSLY REPORTED AND CALLED VALUE.     Culture (A)  Final    STAPHYLOCOCCUS AUREUS SUSCEPTIBILITIES PERFORMED ON PREVIOUS CULTURE WITHIN THE LAST 5 DAYS. Performed at [facility], [place] [address] [place], [place] [address]    Report Status 10/03/2020 FINAL  Final  Culture, blood (routine x 2)     Status: None   Collection Time: 10/03/20  2:42 AM   Specimen: BLOOD  Result Value Ref Range Status   Specimen Description BLOOD LEFT ARM  Final   Special Requests   Final    BOTTLES DRAWN AEROBIC AND ANAEROBIC Blood Culture results may not be optimal due to an inadequate volume of blood received in culture bottles   Culture   Final    NO GROWTH 5 DAYS Performed at [facility], [place] [address], [place], [place] [address]    Report Status 10/08/2020 FINAL  Final  Culture, blood (routine x 2)     Status: None   Collection Time: 10/03/20  2:42 AM   Specimen: BLOOD  Result Value Ref Range Status   Specimen Description BLOOD LEFT HAND  Final   Special Requests   Final    BOTTLES DRAWN AEROBIC ONLY Blood Culture results may not be optimal due to an inadequate volume of blood received in culture bottles   Culture   Final    NO GROWTH 5 DAYS Performed at [facility], [place] [address] [place],  [address]    Report Status 10/08/2020 FINAL  Final  Surgical pcr screen     Status: Abnormal   Collection Time: 10/11/20  6:44 PM  Specimen: Nasal Mucosa; Nasal Swab  Result Value Ref Range Status   MRSA, PCR POSITIVE (A) NEGATIVE Final    Comment: RESULT CALLED TO, READ BACK BY AND VERIFIED WITH: F COMBRES RN 10/11/20 2339 JDW    Staphylococcus aureus POSITIVE (A) NEGATIVE Final    Comment: (NOTE) The Xpert SA Assay (FDA approved for NASAL specimens in patients 58 years of age and older), is one component of a comprehensive surveillance program. It is not intended to diagnose infection nor to guide or monitor treatment. Performed at [facility], [place] [address] [place], [place] [address]   Culture,  blood (routine x 2)     Status: None (Preliminary result)   Collection Time: 10/13/20 10:43 PM   Specimen: BLOOD  Result Value Ref Range Status   Specimen Description BLOOD RIGHT THUMB  Final   Special Requests   Final    BOTTLES DRAWN AEROBIC AND ANAEROBIC Blood Culture adequate volume   Culture   Final    NO GROWTH 4 DAYS Performed at [facility], [place] [address], [place], [place] [address]    Report Status PENDING  Incomplete  Culture, blood (routine x 2)     Status: None (Preliminary result)   Collection Time: 10/13/20 10:43 PM   Specimen: BLOOD RIGHT HAND  Result Value Ref Range Status   Specimen Description BLOOD RIGHT HAND  Final   Special Requests   Final    BOTTLES DRAWN AEROBIC AND ANAEROBIC Blood Culture adequate volume   Culture   Final    NO GROWTH 4 DAYS Performed at [facility], [place] [address] [place], [place] [address]    Report Status PENDING  Incomplete  Aerobic/Anaerobic Culture w Gram Stain (surgical/deep wound)     Status: None (Preliminary result)   Collection Time: 10/14/20 10:38 AM   Specimen: Soft Tissue, Other  Result Value Ref Range Status   Specimen Description TISSUE MITRAL VALVE VEGETATION SPEC A  Final   Special Requests MITRAL VALVE VEGETATION  Final   Gram Stain   Final    WBC PRESENT,BOTH PMN AND MONONUCLEAR NO ORGANISMS SEEN    Culture   Final    NO GROWTH 3 DAYS NO ANAEROBES ISOLATED; CULTURE IN PROGRESS FOR 5 DAYS Performed at [facility], [place] [address], [place],  [address]    Report Status PENDING  Incomplete    Coagulation Studies: No results for input(s): LABPROT, INR in the last 72 hours.  Urinalysis: No results for input(s): COLORURINE, LABSPEC, PHURINE, GLUCOSEU, HGBUR, BILIRUBINUR, KETONESUR, PROTEINUR, UROBILINOGEN, NITRITE, LEUKOCYTESUR in the last 72 hours.  Invalid input(s): APPERANCEUR    Imaging: DG Chest 2 View  Result Date: 10/18/2020 CLINICAL DATA:  Status post mitral valve  replacement EXAM: CHEST - 2 VIEW COMPARISON:  October 17, 2020 FINDINGS: Right chest tube and Cordis have been removed. No pneumothorax. Temporary pacemaker wires are attached to the right heart. There is stable cardiomegaly with pulmonary vascularity within normal limits. There is now ill-defined airspace opacity in the right upper lobe. There is atelectatic change in the bases. There is a prosthetic mitral valve. There is a stent in the left subclavian region. Tumoral calcinosis right shoulder again noted. IMPRESSION: No pneumothorax. New infiltrate right upper lobe concerning for developing pneumonia. Bibasilar atelectasis. Stable cardiomegaly. Tumoral calcinosis right shoulder region again noted. Electronically Signed   By: Lowella Grip III M.D.   On: 10/18/2020 08:01     Medications:   . sodium  chloride    . sodium chloride    . sodium chloride     . aspirin EC  325 mg Oral Daily  . calcium acetate  667 mg Oral TID WC  . cinacalcet  30 mg Oral Q breakfast  . cloNIDine  0.3 mg Transdermal Weekly  . [START ON 10/20/2020] darbepoetin (ARANESP) injection - DIALYSIS  100 mcg Intravenous Q Wed-HD  . docusate sodium  200 mg Oral Daily  . doxercalciferol  3 mcg Intravenous Q M,W,F-HD  . enoxaparin (LOVENOX) injection  30 mg Subcutaneous Q48H  . feeding supplement (NEPRO CARB STEADY)  237 mL Oral BID BM  . gabapentin  200 mg Oral QHS  . influenza vac split quadrivalent PF  0.5 mL Intramuscular Tomorrow-1000  . metoprolol tartrate  50 mg Oral BID  . multivitamin  1 tablet Oral QHS  . pantoprazole  40 mg Oral Daily  . sodium chloride flush  3 mL Intravenous Q12H  . vancomycin variable dose per unstable renal function (pharmacist dosing)   Does not apply See admin instructions   sodium chloride, sodium chloride, sodium chloride, acetaminophen, camphor-menthol, diphenhydrAMINE, lidocaine (PF), lidocaine-prilocaine, ondansetron **OR** ondansetron (ZOFRAN) IV, oxyCODONE,  pentafluoroprop-tetrafluoroeth, senna-docusate, sodium chloride flush, traMADol  Assessment/ Plan:  OP HD:MWF [facility] 4h 350/500 new edw here 22- 75kg (was 82.5kg)2/2.5 bath LUA AVG   Hep 5000 -Aranesp 45mgIVq 2wks - last 09/27/20 (Verified order) -Hectorol385m IV qHD   Assessment/Plan: 1. 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 Doing well.   2. Volumeexcess - resolved.  Dialysis planned with ultrafiltration 10/18/2020 3. HTN - continues on clonidine patch and metoprolol 25 bid 4. 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 S/Ptooth extraction3/8/22.Per ID to get 6 wks IV vanc w/ new end date of 11/25/20.  5. ESRD - usual HD MWF.  Next dialysis 10/18/2020 6. Anemiaof CKD-HGB 9- 10 range. S/P 2 units of PRBCs 3/10.  7. Secondary Hyperparathyroidism - sensipar, binders. Continue hect 3ug tiw 8. Hx Hep C /liver cirrhosis.    LOS: 22[place]'@TODAY't'@7'$ :28 AM

## 2020-10-19 NOTE — Progress Notes (Signed)
Occupational Therapy Re-Evaluation Patient Details Name: Patrick Anthony MRN: [id number] DOB: [birth year] Today's Date: 10/19/2020    History of Present Illness Pt is a 50 y.o. male admitted 09/27/20 with fever, chills, chest pain, SOB, generalized weakness. Pt found to have MRSA bacteremia. 2/22 MRI showed multiple scattered small infarcts (bilateral cerebral hemispheres, R basal ganglia, bileteral cerebellum); suspect embolic process. Echo showed mitral valve vegetation, mild-moderate mitral regurgitation. No significant CAD on L heart cath. 3/8 dental extraction. 3/10 mini MVR with closure of PFO. PMH includes CKD (HD MWF), HTN, CHF, Hep C, polysubstance abuse.   Clinical Impression   Pt cooperative during assessment. Has made significant improvement since last evaluated by OT. Overall S with mobility and ADL tasks. Began education regarding energy conservation and fall prevention strategies. Pt asking appropriate questions about his DC plan - nsg made aware. Pt also asking about the "cyst" on his R lower leg that he states has only been there x 1 week. Will follow acutely to facilitate safe DC home. At this time recommend follow up with [place]. Pt agreeable.    Follow Up Recommendations  Home health OT;Supervision - Intermittent    Equipment Recommendations  None recommended by OT    Recommendations for Other Services       Precautions / Restrictions Precautions Precautions: Fall Restrictions Weight Bearing Restrictions: No      Mobility Bed Mobility Overal bed mobility: Modified Independent                  Transfers Overall transfer level: Needs assistance   Transfers: Sit to/from Stand Sit to Stand: Supervision              Balance Overall balance assessment: Mild deficits observed, not formally tested                                         ADL either performed or assessed with clinical judgement   ADL   Eating/Feeding: Independent    Grooming: Modified independent   Upper Body Bathing: Modified independent   Lower Body Bathing: Modified independent;Sit to/from stand   Upper Body Dressing : Modified independent;Sitting   Lower Body Dressing: Set up;Supervision/safety;Sit to/from stand   Toilet Transfer: Supervision/safety   Toileting- Water quality scientist and Hygiene: Modified independent       Functional mobility during ADLs: Supervision/safety General ADL Comments: Educated on energy conservation and fall prevention strategies     Vision Baseline Vision/History: No visual deficits       Perception Perception Comments: appears WFL   Praxis Praxis Praxis tested?: Within functional limits    Pertinent Vitals/Pain Pain Assessment: Faces Faces Pain Scale: Hurts a little bit Pain Location: generalized discomfort Pain Descriptors / Indicators: Grimacing Pain Intervention(s): Limited activity within patient's tolerance     Hand Dominance Right   Extremity/Trunk Assessment Upper Extremity Assessment Upper Extremity Assessment: Generalized weakness;RUE deficits/detail;LUE deficits/detail RUE Deficits / Details: limited R shoulder flexion to @ 90. May be related to surgical soreness LUE Deficits / Details: overall AROM WFL   Lower Extremity Assessment Lower Extremity Assessment: Defer to PT evaluation ("swelling RLE" "cyst")   Cervical / Trunk Assessment Cervical / Trunk Assessment: Normal   Communication Communication Communication: No difficulties   Cognition Arousal/Alertness: Awake/alert Behavior During Therapy: WFL for tasks assessed/performed Overall Cognitive Status: No family/caregiver present to determine baseline cognitive functioning  General Comments: Appropriate during session; answering questions; unsure of insight into deficits   General Comments       Exercises Exercises: Other exercises Other Exercises Other Exercises:  reviewed importance of completing incentive spirometer   Shoulder Instructions      Home Living Family/patient expects to be discharged to:: Private residence Living Arrangements: Alone Available Help at Discharge: Other (Comment) Type of Home: Apartment Home Access: Stairs to enter Entrance Stairs-Number of Steps: flight   Home Layout: One level     Bathroom Shower/Tub: Advertising copywriter: Yes   Home Equipment: Shower seat - built in   Additional Comments: Pt states he has a handicapped accessible tub that can convert into a shower      Prior Functioning/Environment Level of Independence: Independent        Comments: likes to fish, doesn't work, drives himself to HD        OT Problem List: Decreased activity tolerance;Impaired balance (sitting and/or standing);Decreased knowledge of use of DME or AE;Cardiopulmonary status limiting activity;Pain;Decreased strength      OT Treatment/Interventions: Self-care/ADL training;Energy conservation;DME and/or AE instruction;Patient/family education;Balance training;Therapeutic activities    OT Goals(Current goals can be found in the care plan section) Acute Rehab OT Goals Patient Stated Goal: to return home and be independent OT Goal Formulation: With patient Time For Goal Achievement: 11/02/20 Potential to Achieve Goals: Good  OT Frequency: Min 2X/week   Barriers to D/C:            Co-evaluation              AM-PAC OT "6 Clicks" Daily Activity     Outcome Measure Help from another person eating meals?: None Help from another person taking care of personal grooming?: None Help from another person toileting, which includes using toliet, bedpan, or urinal?: None Help from another person bathing (including washing, rinsing, drying)?: A Little Help from another person to put on and taking off regular upper body clothing?: A Little Help from another person to put on  and taking off regular lower body clothing?: A Little 6 Click Score: 12   End of Session Nurse Communication: Mobility status  Activity Tolerance: Patient tolerated treatment well Patient left: in bed;with bed alarm set  OT Visit Diagnosis: Unsteadiness on feet (R26.81);Muscle weakness (generalized) (M62.81);Pain Pain - part of body:  (generalized)                Time: [id number] OT Time Calculation (min): 33 min Charges:  OT General Charges $OT Visit: 1 Visit OT Evaluation $OT Re-eval: 1 Re-eval OT Treatments $Self Care/Home Management : 8-22 mins  Maurie Boettcher, OT/L   Acute OT Clinical Specialist Acute Rehabilitation Services Pager [phone] Office [phone]   [facility] 10/19/2020, 11:01 AM

## 2020-10-19 NOTE — Plan of Care (Signed)
Plan of care reviewed. Pt's been progressing. Hemodynamics stable, NSR on monitor. On room air, no respiratory distress. Pt remains afebrile. Incision on right chest appears dry and clean, no drainage. Cleaned with Betadine.  Problem: Education: Goal: Knowledge of disease or condition will improve Outcome: Progressing   Problem: Education: Goal: Knowledge of patient specific risk factors addressed and post discharge goals established will improve Outcome: Progressing   Problem: Self-Care: Goal: Ability to participate in self-care as condition permits will improve Outcome: Progressing   Problem: Ischemic Stroke/TIA Tissue Perfusion: Goal: Complications of ischemic stroke/TIA will be minimized Outcome: Progressing   Problem: Activity: Goal: Ability to return to baseline activity level will improve Outcome: Progressing   Problem: Education: Goal: Will demonstrate proper wound care and an understanding of methods to prevent future damage Outcome: Progressing   Problem: Respiratory: Goal: Respiratory status will improve Outcome: Progressing   Problem: Skin Integrity: Goal: Risk for impaired skin integrity will decrease Outcome: Progressing   Problem: Clinical Measurements: Goal: Complications related to the disease process, condition or treatment will be avoided or minimized Outcome: Progressing   No immediate distress. We will continue to monitor. Kennyth Lose, RN

## 2020-10-19 NOTE — Progress Notes (Addendum)
It was brought to my attention that patient had a nodular lesion located over the anterior shin of the right leg.  It is fluctuant tender.  I believe this represents a subcutaneous abscess.  The question would be whether this involves bone.  Status post mitral valve replacement 10/14/2020.  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  I believe this probably represents seeding to the bone or subcutaneous tissue.  Will consult orthopedics

## 2020-10-19 NOTE — Consult Note (Signed)
Reason for Consult:Right lower leg abscess Referring Physician: Willy Eddy Time called: [id number] Time at bedside: Patterson Tract is an 50 y.o. male.  HPI: Patrick Anthony has been in the hospital for about 3 weeks. He was admitted with sepsis 2/2 PNA. He suffered embolic CVA's and developed endocarditis. He underwent MV replacement 3/10. He notes that he's had bumps on his leg for about a week but hasn't brought them to anyone's attention until today and orthopedic surgery was consulted. He c/o mild pain associated with them. Denies prior e/o.  Past Medical History:  Diagnosis Date  . 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 Chronic kidney disease    patient on dialysis tues-thurs-sat  . Cirrhosis of liver ([place])   . Drug addiction ([place])   . ETOH abuse   . Hepatitis   . Hypertension   . Mitral regurgitation   . Patent foramen ovale 10/23/2019  . Renal disorder renal failure  . S/P minimally-invasive mitral valve replacement with bioprosthetic valve 10/14/2020   31 mm Medtronic Mosaic stented porcine bioprosthetic tissue valve via right mini thoracotomy approach  . Sleep apnea   . Systolic and diastolic CHF, 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[place]) 04/19/2013    Past Surgical History:  Procedure Laterality Date  . AV FISTULA PLACEMENT Right   . AV FISTULA PLACEMENT Left 09/03/2013   Procedure: ARTERIOVENOUS (AV) FISTULA CREATION - LEFT BRACHIOCEPHALIC;  Surgeon: Conrad [place], MD;  Location: [facility] OR;  Service: Vascular;  Laterality: Left;  . AV FISTULA PLACEMENT Left 10/30/2019   Procedure: INSERTION OF ARTERIOVENOUS (AV) GORE-TEX GRAFT ARM;  Surgeon: Rosetta Posner, MD;  Location: [place];  Service: Vascular;  Laterality: Left;  . BUBBLE STUDY  10/05/2020   Procedure: BUBBLE STUDY;  Surgeon: Pixie Casino, MD;  Location: [place];  Service: Cardiovascular;;  . INSERTION OF DIALYSIS CATHETER N/A 08/29/2013   Procedure:  INSERTION OF DIALYSIS CATHETER;  Surgeon: Mal Misty, MD;  Location: [place];  Service: Vascular;  Laterality: N/A;  . INSERTION OF DIALYSIS CATHETER Left 10/30/2019   Procedure: INSERTION OF DIALYSIS CATHETER LEFT INTERNAL JUGULAR;  Surgeon: Rosetta Posner, MD;  Location: [place];  Service: Vascular;  Laterality: Left;  . LEFT HEART CATH AND CORONARY ANGIOGRAPHY N/A 10/07/2020   Procedure: LEFT HEART CATH AND CORONARY ANGIOGRAPHY;  Surgeon: Lorretta Harp, MD;  Location: [place] [facility];  Service: Cardiovascular;  Laterality: N/A;  . MITRAL VALVE REPAIR Right 10/14/2020   Procedure: MINIMALLY INVASIVE MITRAL VALVE  REPLACEMENT(MVR) USING MEDTRONIC MOSAIC VALVE SIZE 31MM;  Surgeon: Rexene Alberts, MD;  Location: [place];  Service: Open Heart Surgery;  Laterality: Right;  . MULTIPLE EXTRACTIONS WITH ALVEOLOPLASTY N/A 10/12/2020   Procedure: MULTIPLE EXTRACTION WITH ALVEOLOPLASTY;  Surgeon: [name], DMD;  Location: [place];  Service: Dentistry;  Laterality: N/A;  . PERIPHERAL VASCULAR CATHETERIZATION N/A 01/06/2015   Procedure: A/V Shuntogram/Fistulagram;  Surgeon: Katha Cabal, MD;  Location: [place] CV LAB;  Service: Cardiovascular;  Laterality: N/A;  . PERIPHERAL VASCULAR CATHETERIZATION Left 01/06/2015   Procedure: A/V Shunt Intervention;  Surgeon: Katha Cabal, MD;  Location: [place] CV LAB;  Service: Cardiovascular;  Laterality: Left;  . REPAIR OF PATENT FORAMEN OVALE N/A 10/14/2020   Procedure: CLOSURE OF PATENT FORAMEN OVALE;  Surgeon: Rexene Alberts, MD;  Location: [place];  Service: Open Heart Surgery;  Laterality: N/A;  . TEE WITHOUT CARDIOVERSION N/A 10/05/2020   Procedure: TRANSESOPHAGEAL ECHOCARDIOGRAM (TEE);  Surgeon: Pixie Casino, MD;  Location: [place];  Service: Cardiovascular;  Laterality: N/A;  . TEE WITHOUT CARDIOVERSION N/A 10/14/2020   Procedure: TRANSESOPHAGEAL ECHOCARDIOGRAM (TEE);  Surgeon: Rexene Alberts, MD;  Location: [place];  Service: Open  Heart Surgery;  Laterality: N/A;    Family History  Problem Relation Age of Onset  . Hypertension Mother   . Cancer Mother        breast    Social History:  reports that he quit smoking about 3 years ago. His smoking use included cigarettes. He smoked 0.20 packs per day. He has never used smokeless tobacco. He reports that he does not drink alcohol and does not use drugs.  Allergies: No Known Allergies  Medications: I have reviewed the patient's current medications.  Results for orders placed or performed during the hospital encounter of 09/27/20 (from the past 48 hour(s))  Basic metabolic panel     Status: Abnormal   Collection Time: 10/18/20  1:48 AM  Result Value Ref Range   Sodium 127 (L) 135 - 145 mmol/L   Potassium 4.3 3.5 - 5.1 mmol/L   Chloride 93 (L) 98 - 111 mmol/L   CO2 25 22 - 32 mmol/L   Glucose, Bld 80 70 - 99 mg/dL    Comment: Glucose reference range applies only to samples taken after fasting for at least 8 hours.   BUN 28 (H) 6 - 20 mg/dL   Creatinine, Ser 5.37 (H) 0.61 - 1.24 mg/dL   Calcium 8.1 (L) 8.9 - 10.3 mg/dL   GFR, Estimated 12 (L) >60 mL/min    Comment: (NOTE) Calculated using the CKD-EPI Creatinine Equation (2021)    Anion gap 9 5 - 15    Comment: Performed at [facility] [address] [place], [place] [address]  CBC     Status: Abnormal   Collection Time: 10/18/20  8:33 AM  Result Value Ref Range   WBC 13.3 (H) 4.0 - 10.5 K/uL   RBC 3.22 (L) 4.22 - 5.81 MIL/uL   Hemoglobin 9.1 (L) 13.0 - 17.0 g/dL   HCT 29.4 (L) 39.0 - 52.0 %   MCV 91.3 80.0 - 100.0 fL   MCH 28.3 26.0 - 34.0 pg   MCHC 31.0 30.0 - 36.0 g/dL   RDW 17.4 (H) 11.5 - 15.5 %   Platelets 211 150 - 400 K/uL   nRBC 0.2 0.0 - 0.2 %    Comment: Performed at [facility], [place] [address] [place], [place] [address]    DG Chest 2 View  Result Date: 10/18/2020 CLINICAL DATA:  Status post mitral valve replacement EXAM: CHEST - 2 VIEW COMPARISON:  October 17, 2020  FINDINGS: Right chest tube and Cordis have been removed. No pneumothorax. Temporary pacemaker wires are attached to the right heart. There is stable cardiomegaly with pulmonary vascularity within normal limits. There is now ill-defined airspace opacity in the right upper lobe. There is atelectatic change in the bases. There is a prosthetic mitral valve. There is a stent in the left subclavian region. Tumoral calcinosis right shoulder again noted. IMPRESSION: No pneumothorax. New infiltrate right upper lobe concerning for developing pneumonia. Bibasilar atelectasis. Stable cardiomegaly. Tumoral calcinosis right shoulder region again noted. Electronically Signed   By: Lowella Grip III M.D.   On: 10/18/2020 08:01    Review of Systems  Constitutional: Negative for chills, diaphoresis and  fever.  HENT: Negative for ear discharge, ear pain, hearing loss and tinnitus.   Eyes: Negative for photophobia and pain.  Respiratory: Negative for cough and shortness of breath.   Cardiovascular: Negative for chest pain.  Gastrointestinal: Negative for abdominal pain, nausea and vomiting.  Genitourinary: Negative for dysuria, flank pain, frequency and urgency.  Musculoskeletal: Positive for arthralgias (Right lower leg). Negative for back pain, myalgias and neck pain.  Neurological: Negative for dizziness and headaches.  Hematological: Does not bruise/bleed easily.  Psychiatric/Behavioral: The patient is not nervous/anxious.    Blood pressure 113/82, pulse 84, temperature 98.1 F (36.7 C), temperature source Oral, resp. rate 13, height 6' (1.829 m), weight 76.6 kg, SpO2 100 %. Physical Exam Constitutional:      General: He is not in acute distress.    Appearance: He is well-developed. He is not diaphoretic.  HENT:     Head: Normocephalic and atraumatic.  Eyes:     General: No scleral icterus.       Right eye: No discharge.        Left eye: No discharge.     Conjunctiva/sclera: Conjunctivae normal.   Cardiovascular:     Rate and Rhythm: Normal rate and regular rhythm.  Pulmonary:     Effort: Pulmonary effort is normal. No respiratory distress.  Musculoskeletal:     Cervical back: Normal range of motion.     Comments: LLE No traumatic wounds, ecchymosis, or rash  Large area of fluctuance anterior lower leg with two foci, mod TTP, no erythema or warmth  No knee or ankle effusion  Knee stable to varus/ valgus and anterior/posterior stress  Sens DPN, SPN, TN intact  Motor EHL, ext, flex, evers 5/5  DP 1+, PT 0, No significant edema  Skin:    General: Skin is warm and dry.  Neurological:     Mental Status: He is alert.  Psychiatric:        Behavior: Behavior normal.       Assessment/Plan: Right lower leg abscess -- Appears to be moderately extensive. Will get x-ray to look for osteo, may need MRI. Will probably need operative I&D given size.    Lisette Abu, PA-C Orthopedic Surgery [phone] 10/19/2020, 12:00 PM

## 2020-10-19 NOTE — Progress Notes (Signed)
      [address]       [place] [id number]             [phone]     CARDIOTHORACIC SURGERY PROGRESS NOTE  5 Days Post-Op  S/P Procedure(s) (LRB): MINIMALLY INVASIVE MITRAL VALVE  REPLACEMENT(MVR) USING MEDTRONIC MOSAIC VALVE SIZE 31MM (Right) TRANSESOPHAGEAL ECHOCARDIOGRAM (TEE) (N/A) CLOSURE OF PATENT FORAMEN OVALE (N/A)  Subjective: No complaints except for pain anterior right lower leg  Objective: Vital signs in last 24 hours: Temp:  [97.8 F (36.6 C)-98.8 F (37.1 C)] 97.8 F (36.6 C) (03/15 1202) Pulse Rate:  [80-94] 87 (03/15 1202) Cardiac Rhythm: Normal sinus rhythm;Heart block (03/15 0744) Resp:  [10-20] 15 (03/15 1202) BP: (108-152)/(75-113) 145/95 (03/15 1202) SpO2:  [96 %-100 %] 96 % (03/15 1202) Weight:  [76.6 kg] 76.6 kg (03/15 0500)  Physical Exam:  Rhythm:   sinus  Breath sounds: clear  Heart sounds:  RRR w/out murmur  Incisions:  Clean and dry  Abdomen:  Soft, non-distended, non-tender  Extremities:  Warm, well-perfused, fluctuant soft tissue masses right lower leg c/w subQ abscesses   Intake/Output from previous day: 03/14 0701 - 03/15 0700 In: 1143 [P.O.:1140; I.V.:3] Out: 2000  Intake/Output this shift: Total I/O In: 240 [P.O.:240] Out: 0   Lab Results: Recent Labs    10/18/20 0833  WBC 13.3*  HGB 9.1*  HCT 29.4*  PLT 211   BMET:  Recent Labs    10/18/20 0148  NA 127*  K 4.3  CL 93*  CO2 25  GLUCOSE 80  BUN 28*  CREATININE 5.37*  CALCIUM 8.1*    CBG (last 3)  Recent Labs    10/17/20 0625 10/17/20 0807 10/17/20 1158  GLUCAP 82 76 84   PT/INR:  No results for input(s): LABPROT, INR in the last 72 hours.  CXR:  CHEST - 2 VIEW  COMPARISON:  October 17, 2020  FINDINGS: Right chest tube and Cordis have been removed. No pneumothorax. Temporary pacemaker wires are attached to the right heart. There is stable cardiomegaly with pulmonary vascularity within normal limits. There is now ill-defined  airspace opacity in the right upper lobe. There is atelectatic change in the bases. There is a prosthetic mitral valve. There is a stent in the left subclavian region. Tumoral calcinosis right shoulder again noted.  IMPRESSION: No pneumothorax. New infiltrate right upper lobe concerning for developing pneumonia. Bibasilar atelectasis. Stable cardiomegaly.  Tumoral calcinosis right shoulder region again noted.   Electronically Signed   By: Lowella Grip III M.D.   On: 10/18/2020 08:01   Assessment/Plan: S/P Procedure(s) (LRB): MINIMALLY INVASIVE MITRAL VALVE  REPLACEMENT(MVR) USING MEDTRONIC MOSAIC VALVE SIZE 31MM (Right) TRANSESOPHAGEAL ECHOCARDIOGRAM (TEE) (N/A) CLOSURE OF PATENT FORAMEN OVALE (N/A)  Stable from cardiac standpoint May need surgical I+D right leg per orthopedic surgery team Remainder of care per medical teams Will continue to follow  Rexene Alberts, MD 10/19/2020 2:39 PM

## 2020-10-19 NOTE — Progress Notes (Signed)
Mobility Specialist - Progress Note   10/19/20 1351  Mobility  Activity Refused mobility   States he has ambulated several times today and would like to rest, will f/u as able.   Pricilla Handler Mobility Specialist Mobility Specialist Phone: [phone]

## 2020-10-19 NOTE — Progress Notes (Signed)
Mobility Specialist - Progress Note   10/19/20 1639  Mobility  Activity Ambulated in hall  Level of Assistance Standby assist, set-up cues, supervision of patient - no hands on  Assistive Device Front wheel walker  Distance Ambulated (ft) 450 ft  Mobility Response Tolerated well  Mobility performed by Mobility specialist  $Mobility charge 1 Mobility   Pre-mobility: 83 HR During mobility: 96 HR Post-mobility: 84 HR  Pt asx throughout ambulation. He states he is worried about grocery shopping d/t pain when using R UE, may benefit from a reacher. Pt sitting up on edge of bed after walk.   Pricilla Handler Mobility Specialist Mobility Specialist Phone: [phone]

## 2020-10-19 NOTE — Progress Notes (Signed)
Checked with pt several times today but has been eating, walking with other therapies. Will try back tomorrow. [name] CES, ACSM 3:19 PM 10/19/2020

## 2020-10-19 NOTE — Progress Notes (Signed)
Physical Therapy Treatment Patient Details Name: Patrick Anthony MRN: [id number] DOB: 19-Mar-1971 Today's Date: 10/19/2020    History of Present Illness Pt is a 50 y.o. male admitted 09/27/20 with fever, chills, chest pain, SOB, generalized weakness. Pt found to have MRSA bacteremia. 2/22 MRI showed multiple scattered small infarcts (bilateral cerebral hemispheres, R basal ganglia, bileteral cerebellum); suspect embolic process. Echo showed mitral valve vegetation, mild-moderate mitral regurgitation. No significant CAD on L heart cath. 3/8 dental extraction. 3/10 mini MVR with closure of PFO. 3/15 orthopedics consulted due to RLE possible abscess. PMH includes CKD (HD MWF), HTN, CHF, Hep C, polysubstance abuse.    PT Comments    Pt received seated EOB, agreeable to therapy session and with good participation and tolerance for mobility. Pt making good progress toward mobility goals this date with focus on gait trial using RW and stair training. He does tend to ignore some cues for step sequencing with stairs and noted to have some impulsivity but fairly steady with RW use and handrails for stair ascent/descent and up to min guard assist for safety. He reports increased RLE pain (localized swelling at anterior mid-tibia, RN/MD aware). Pt continues to benefit from PT services to progress toward functional mobility goals. Continue to recommend HHPT.   Follow Up Recommendations  Home health PT     Equipment Recommendations  Rolling walker with 5" wheels;3in1 (PT)    Recommendations for Other Services       Precautions / Restrictions Precautions Precautions: Fall Restrictions Weight Bearing Restrictions: No    Mobility  Bed Mobility Overal bed mobility: Modified Independent                  Transfers Overall transfer level: Needs assistance   Transfers: Sit to/from Stand Sit to Stand: Supervision         General transfer comment: from EOB  Ambulation/Gait Ambulation/Gait  assistance: Min guard Gait Distance (Feet): 250 Feet Assistive device: Rolling walker (2 wheeled) Gait Pattern/deviations: Step-through pattern;Decreased stride length;Trunk flexed;Antalgic;Decreased weight shift to right Gait velocity: reduced   General Gait Details: RLE mildly antalgic, ortho has been consulted - new swelling over shin; good use of RW, no LOB denies dizziness   Stairs Stairs: Yes Stairs assistance: Min guard Stair Management: Two rails;Alternating pattern;Step to pattern;Forwards Number of Stairs: 10 General stair comments: pt ignoring therapist cues for step-to pattern as he is having moderate RLE pain but no overt LOB with reciprocal pattern when ascending; slightly less stable when descending and tending to lead with LLE despite cues to lead with RLE. after 5 steps descended, switched to RLE descending but reports minimal difference in pain   Wheelchair Mobility    Modified Rankin (Stroke Patients Only)       Balance Overall balance assessment: Needs assistance Sitting-balance support: No upper extremity supported;Feet supported Sitting balance-Leahy Scale: Good     Standing balance support: No upper extremity supported;During functional activity;Bilateral upper extremity supported Standing balance-Leahy Scale: Fair Standing balance comment: able to perform gait trial in room no AD and furniture walking at times, preferring RW for out of room due to increased RLE soreness                            Cognition Arousal/Alertness: Awake/alert Behavior During Therapy: WFL for tasks assessed/performed Overall Cognitive Status: No family/caregiver present to determine baseline cognitive functioning  General Comments: Appropriate during session; answering questions; unsure of insight into deficits; pt concerned about the "swelling" on his R lower leg, per RN orthopedics have been consulted. Pt following  most 1-step commands but some impulsivity noted; during stair training ignoring commands for step sequencing and decreased willingness to attempt sequencing per therapist reocmmendation; eventually agreed to attempt halfway through stair descent.      Exercises Other Exercises Other Exercises: reviewed importance of completing incentive spirometer, pt pulling 1250 x3 reps    General Comments        Pertinent Vitals/Pain Pain Assessment: 0-10 Pain Score: 5  Faces Pain Scale: Hurts a little bit Pain Location: RLE (localized swelling over mid-tibia) Pain Descriptors / Indicators: Grimacing;Tender;Sore Pain Intervention(s): Monitored during session;Repositioned;RN gave pain meds during session    [name] expects to be discharged to:: Private residence Living Arrangements: Alone Available Help at Discharge: Other (Comment) Type of Home: Apartment Home Access: Stairs to enter   Home Layout: One level Home Equipment: Shower seat - built in Additional Comments: Pt states he has a handicapped accessible tub that can convert into a shower    Prior Function Level of Independence: Independent      Comments: likes to fish, doesn't work, drives himself to HD   PT Goals (current goals can now be found in the care plan section) Acute Rehab PT Goals Patient Stated Goal: to return home and be independent PT Goal Formulation: With patient Time For Goal Achievement: 10/31/20 Potential to Achieve Goals: Fair Progress towards PT goals: Progressing toward goals    Frequency    Min 3X/week      PT Plan Current plan remains appropriate    Co-evaluation              AM-PAC PT "6 Clicks" Mobility   Outcome Measure  Help needed turning from your back to your side while in a flat bed without using bedrails?: None Help needed moving from lying on your back to sitting on the side of a flat bed without using bedrails?: None Help needed moving to and from a bed to a  chair (including a wheelchair)?: A Little Help needed standing up from a chair using your arms (e.g., wheelchair or bedside chair)?: A Little Help needed to walk in hospital room?: A Little Help needed climbing 3-5 steps with a railing? : A Little 6 Click Score: 20    End of Session Equipment Utilized During Treatment: Gait belt Activity Tolerance: Patient tolerated treatment well Patient left: in bed;with call bell/phone within reach (seated EOB RN entering room to administer meds) Nurse Communication: Mobility status PT Visit Diagnosis: Difficulty in walking, not elsewhere classified (R26.2);Other abnormalities of gait and mobility (R26.89)     Time: [id number] PT Time Calculation (min) (ACUTE ONLY): 16 min  Charges:  $Gait Training: 8-22 mins                     Ura Hausen P., PTA Acute Rehabilitation Services Pager: [phone] Office: [place] 10/19/2020, 1:20 PM

## 2020-10-20 ENCOUNTER — Encounter (HOSPITAL_COMMUNITY)
Admission: EM | Disposition: A | Discharge status: Skilled Nursing Facility | Payer: Self-pay | Source: Home / Self Care | Attending: Internal Medicine

## 2020-10-20 ENCOUNTER — Inpatient Hospital Stay (HOSPITAL_COMMUNITY): Payer: Medicaid Other | Admitting: Anesthesiology

## 2020-10-20 ENCOUNTER — Encounter (HOSPITAL_COMMUNITY): Payer: Self-pay | Admitting: Thoracic Surgery (Cardiothoracic Vascular Surgery)

## 2020-10-20 ENCOUNTER — Inpatient Hospital Stay (HOSPITAL_COMMUNITY): Payer: Medicaid Other

## 2020-10-20 DIAGNOSIS — L0291 Cutaneous abscess, unspecified: Secondary | ICD-10-CM | POA: Diagnosis not present

## 2020-10-20 DIAGNOSIS — J189 Pneumonia, unspecified organism: Secondary | ICD-10-CM | POA: Diagnosis not present

## 2020-10-20 DIAGNOSIS — N186 End stage renal disease: Secondary | ICD-10-CM | POA: Diagnosis not present

## 2020-10-20 DIAGNOSIS — Z952 Presence of prosthetic heart valve: Secondary | ICD-10-CM | POA: Diagnosis not present

## 2020-10-20 DIAGNOSIS — M86461 Chronic osteomyelitis with draining sinus, right tibia and fibula: Secondary | ICD-10-CM | POA: Diagnosis not present

## 2020-10-20 DIAGNOSIS — A419 Sepsis, unspecified organism: Secondary | ICD-10-CM | POA: Diagnosis not present

## 2020-10-20 DIAGNOSIS — R29718 NIHSS score 18: Secondary | ICD-10-CM | POA: Diagnosis not present

## 2020-10-20 DIAGNOSIS — M7989 Other specified soft tissue disorders: Secondary | ICD-10-CM | POA: Diagnosis not present

## 2020-10-20 DIAGNOSIS — R531 Weakness: Secondary | ICD-10-CM | POA: Diagnosis not present

## 2020-10-20 DIAGNOSIS — Z20822 Contact with and (suspected) exposure to covid-19: Secondary | ICD-10-CM | POA: Diagnosis not present

## 2020-10-20 DIAGNOSIS — I639 Cerebral infarction, unspecified: Secondary | ICD-10-CM | POA: Diagnosis not present

## 2020-10-20 DIAGNOSIS — I6310 Cerebral infarction due to embolism of unspecified precerebral artery: Secondary | ICD-10-CM | POA: Diagnosis not present

## 2020-10-20 DIAGNOSIS — A4102 Sepsis due to Methicillin resistant Staphylococcus aureus: Secondary | ICD-10-CM | POA: Diagnosis not present

## 2020-10-20 DIAGNOSIS — R7881 Bacteremia: Secondary | ICD-10-CM | POA: Diagnosis not present

## 2020-10-20 DIAGNOSIS — Z992 Dependence on renal dialysis: Secondary | ICD-10-CM | POA: Diagnosis not present

## 2020-10-20 DIAGNOSIS — I330 Acute and subacute infective endocarditis: Secondary | ICD-10-CM | POA: Diagnosis not present

## 2020-10-20 DIAGNOSIS — R29712 NIHSS score 12: Secondary | ICD-10-CM | POA: Diagnosis not present

## 2020-10-20 DIAGNOSIS — I10 Essential (primary) hypertension: Secondary | ICD-10-CM | POA: Diagnosis not present

## 2020-10-20 DIAGNOSIS — R0602 Shortness of breath: Secondary | ICD-10-CM | POA: Diagnosis not present

## 2020-10-20 DIAGNOSIS — I5042 Chronic combined systolic (congestive) and diastolic (congestive) heart failure: Secondary | ICD-10-CM | POA: Diagnosis not present

## 2020-10-20 DIAGNOSIS — I5032 Chronic diastolic (congestive) heart failure: Secondary | ICD-10-CM | POA: Diagnosis not present

## 2020-10-20 DIAGNOSIS — R29711 NIHSS score 11: Secondary | ICD-10-CM | POA: Diagnosis not present

## 2020-10-20 DIAGNOSIS — L02415 Cutaneous abscess of right lower limb: Secondary | ICD-10-CM | POA: Diagnosis not present

## 2020-10-20 DIAGNOSIS — R29716 NIHSS score 16: Secondary | ICD-10-CM | POA: Diagnosis not present

## 2020-10-20 DIAGNOSIS — I5043 Acute on chronic combined systolic (congestive) and diastolic (congestive) heart failure: Secondary | ICD-10-CM | POA: Diagnosis not present

## 2020-10-20 DIAGNOSIS — B182 Chronic viral hepatitis C: Secondary | ICD-10-CM | POA: Diagnosis not present

## 2020-10-20 DIAGNOSIS — I2690 Septic pulmonary embolism without acute cor pulmonale: Secondary | ICD-10-CM | POA: Diagnosis not present

## 2020-10-20 DIAGNOSIS — I6340 Cerebral infarction due to embolism of unspecified cerebral artery: Secondary | ICD-10-CM | POA: Diagnosis not present

## 2020-10-20 DIAGNOSIS — J984 Other disorders of lung: Secondary | ICD-10-CM | POA: Diagnosis not present

## 2020-10-20 DIAGNOSIS — N2581 Secondary hyperparathyroidism of renal origin: Secondary | ICD-10-CM | POA: Diagnosis not present

## 2020-10-20 DIAGNOSIS — I132 Hypertensive heart and chronic kidney disease with heart failure and with stage 5 chronic kidney disease, or end stage renal disease: Secondary | ICD-10-CM | POA: Diagnosis not present

## 2020-10-20 DIAGNOSIS — D631 Anemia in chronic kidney disease: Secondary | ICD-10-CM | POA: Diagnosis not present

## 2020-10-20 DIAGNOSIS — G928 Other toxic encephalopathy: Secondary | ICD-10-CM | POA: Diagnosis not present

## 2020-10-20 DIAGNOSIS — M86661 Other chronic osteomyelitis, right tibia and fibula: Secondary | ICD-10-CM | POA: Diagnosis not present

## 2020-10-20 HISTORY — PX: I & D EXTREMITY: SHX5045

## 2020-10-20 LAB — CBC WITH DIFFERENTIAL/PLATELET
Abs Immature Granulocytes: 0.14 10*3/uL — ABNORMAL HIGH (ref 0.00–0.07)
Basophils Absolute: 0 10*3/uL (ref 0.0–0.1)
Basophils Relative: 0 %
Eosinophils Absolute: 0.6 10*3/uL — ABNORMAL HIGH (ref 0.0–0.5)
Eosinophils Relative: 5 %
HCT: 28.6 % — ABNORMAL LOW (ref 39.0–52.0)
Hemoglobin: 9.2 g/dL — ABNORMAL LOW (ref 13.0–17.0)
Immature Granulocytes: 1 %
Lymphocytes Relative: 12 %
Lymphs Abs: 1.4 10*3/uL (ref 0.7–4.0)
MCH: 28.9 pg (ref 26.0–34.0)
MCHC: 32.2 g/dL (ref 30.0–36.0)
MCV: 89.9 fL (ref 80.0–100.0)
Monocytes Absolute: 1.5 10*3/uL — ABNORMAL HIGH (ref 0.1–1.0)
Monocytes Relative: 13 %
Neutro Abs: 7.6 10*3/uL (ref 1.7–7.7)
Neutrophils Relative %: 69 %
Platelets: 243 10*3/uL (ref 150–400)
RBC: 3.18 MIL/uL — ABNORMAL LOW (ref 4.22–5.81)
RDW: 18 % — ABNORMAL HIGH (ref 11.5–15.5)
WBC: 11.3 10*3/uL — ABNORMAL HIGH (ref 4.0–10.5)
nRBC: 0 % (ref 0.0–0.2)

## 2020-10-20 LAB — RENAL FUNCTION PANEL
Albumin: 2.1 g/dL — ABNORMAL LOW (ref 3.5–5.0)
Albumin: 2.2 g/dL — ABNORMAL LOW (ref 3.5–5.0)
Anion gap: 13 (ref 5–15)
Anion gap: 13 (ref 5–15)
BUN: 37 mg/dL — ABNORMAL HIGH (ref 6–20)
BUN: 45 mg/dL — ABNORMAL HIGH (ref 6–20)
CO2: 24 mmol/L (ref 22–32)
CO2: 23 mmol/L (ref 22–32)
Calcium: 8.6 mg/dL — ABNORMAL LOW (ref 8.9–10.3)
Calcium: 8.3 mg/dL — ABNORMAL LOW (ref 8.9–10.3)
Chloride: 93 mmol/L — ABNORMAL LOW (ref 98–111)
Chloride: 90 mmol/L — ABNORMAL LOW (ref 98–111)
Creatinine, Ser: 6.67 mg/dL — ABNORMAL HIGH (ref 0.61–1.24)
Creatinine, Ser: 7.98 mg/dL — ABNORMAL HIGH (ref 0.61–1.24)
GFR, Estimated: 9 mL/min — ABNORMAL LOW (ref >60)
GFR, Estimated: 8 mL/min — ABNORMAL LOW (ref >60)
Glucose, Bld: 92 mg/dL (ref 70–99)
Glucose, Bld: 121 mg/dL — ABNORMAL HIGH (ref 70–99)
Phosphorus: 5.1 mg/dL — ABNORMAL HIGH (ref 2.5–4.6)
Phosphorus: 5.5 mg/dL — ABNORMAL HIGH (ref 2.5–4.6)
Potassium: 3.9 mmol/L (ref 3.5–5.1)
Potassium: 5 mmol/L (ref 3.5–5.1)
Sodium: 130 mmol/L — ABNORMAL LOW (ref 135–145)
Sodium: 126 mmol/L — ABNORMAL LOW (ref 135–145)

## 2020-10-20 LAB — CBC
HCT: 29.2 % — ABNORMAL LOW (ref 39.0–52.0)
Hemoglobin: 9.1 g/dL — ABNORMAL LOW (ref 13.0–17.0)
MCH: 28 pg (ref 26.0–34.0)
MCHC: 31.2 g/dL (ref 30.0–36.0)
MCV: 89.8 fL (ref 80.0–100.0)
Platelets: 296 10*3/uL (ref 150–400)
RBC: 3.25 MIL/uL — ABNORMAL LOW (ref 4.22–5.81)
RDW: 18.3 % — ABNORMAL HIGH (ref 11.5–15.5)
WBC: 14.1 10*3/uL — ABNORMAL HIGH (ref 4.0–10.5)
nRBC: 0 % (ref 0.0–0.2)

## 2020-10-20 LAB — VANCOMYCIN, RANDOM: Vancomycin Rm: 20

## 2020-10-20 IMAGING — CT CT TIBIA FIBULA *R* W/ CM
2 of 3 series · 7 of 33 positions shown, 8 images · IV contrast (omnipaque)
Comparison: X-ray [DATE], [DATE]
COMPARISON: X-ray [DATE], [DATE]

Addendum:
CLINICAL DATA: Lower leg soft tissue infection. Focal swelling.
Concern for abscess.

EXAM:
CT OF THE LOWER RIGHT EXTREMITY WITH CONTRAST
TECHNIQUE: Multidetector CT imaging of the lower right extremity was performed
according to the standard protocol following intravenous contrast
administration.
CONTRAST:  100mL OMNIPAQUE IOHEXOL 300 MG/ML  SOLN

[Series 4: lfov ext 3.0 b40s · axial · 0.38mm/px · z∈[+10,+370]mm · 4 of 174 slices shown, 5 images]
[im 27/174  soft-tissue]
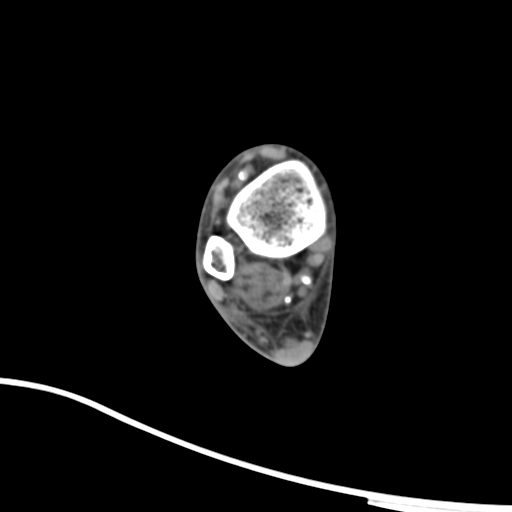
[im 27/174  bone]
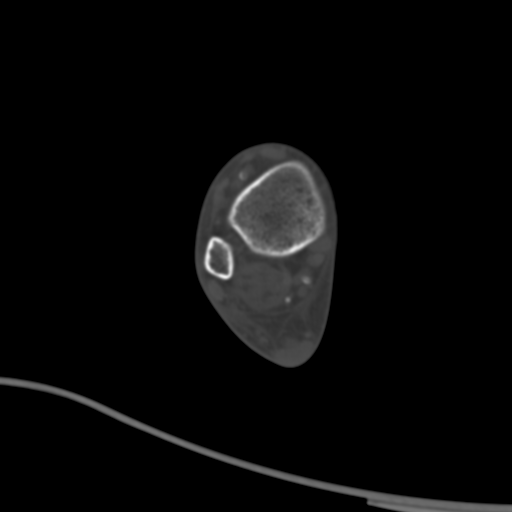
[im 67/174  bone]
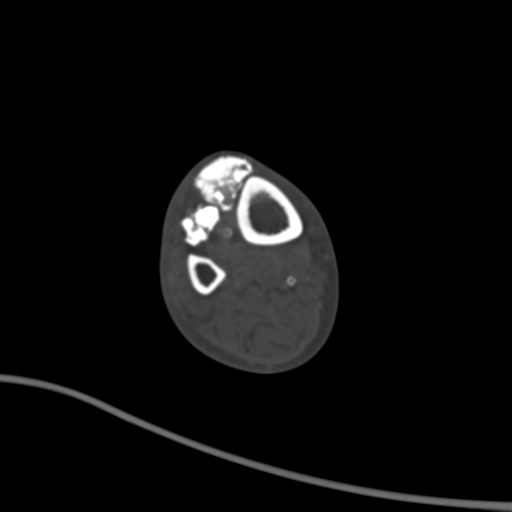
[im 107/174  bone]
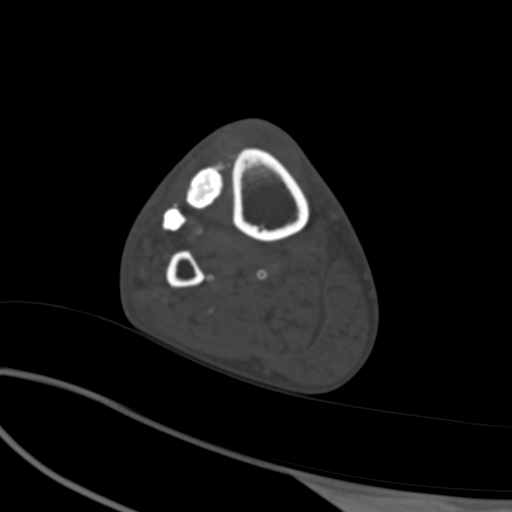
[im 147/174  bone]
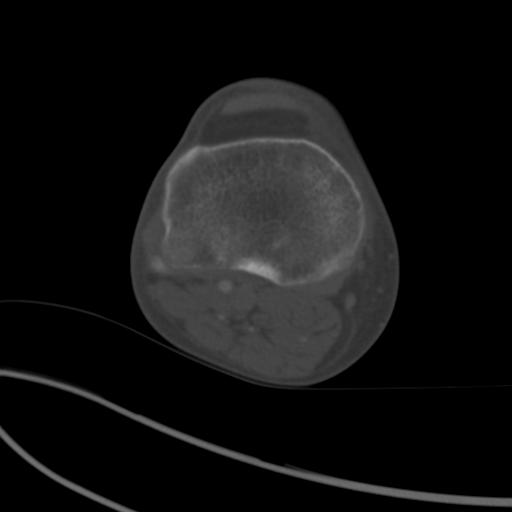

[Series 9: coronalsoft tissue · coronal · 0.30mm/px · 3 of 80 slices shown]
[im 16/80  bone]
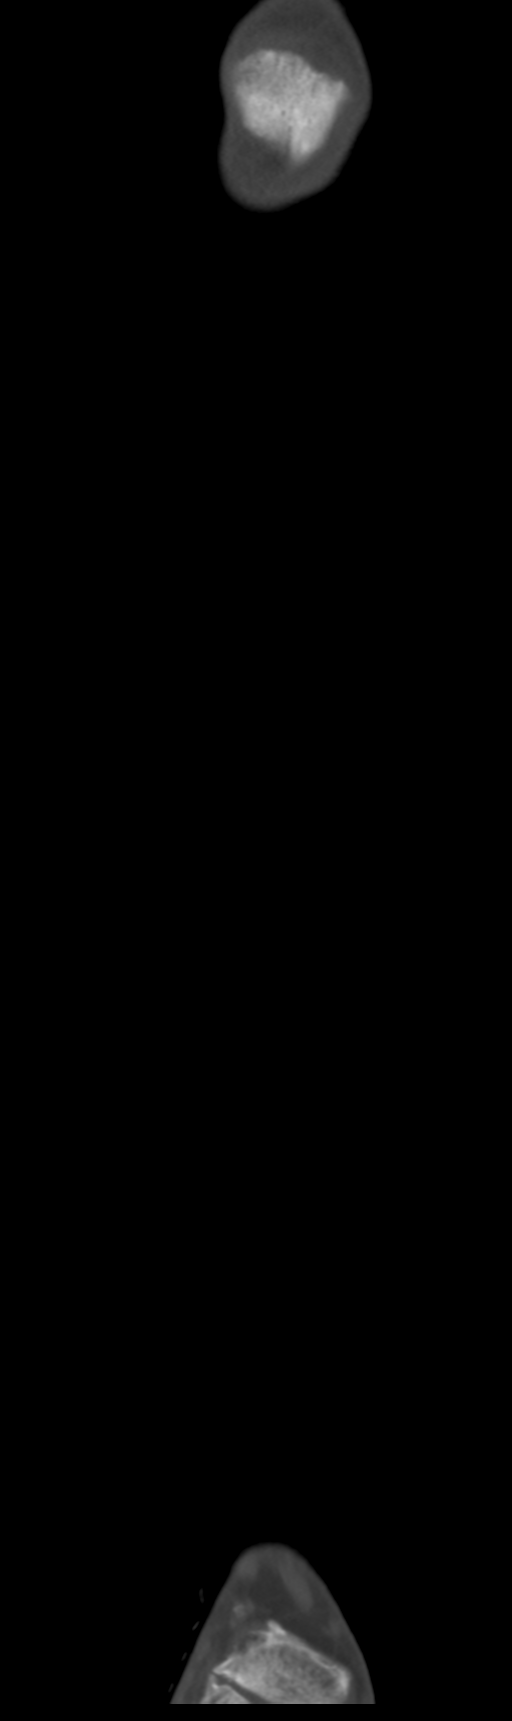
[im 32/80  bone]
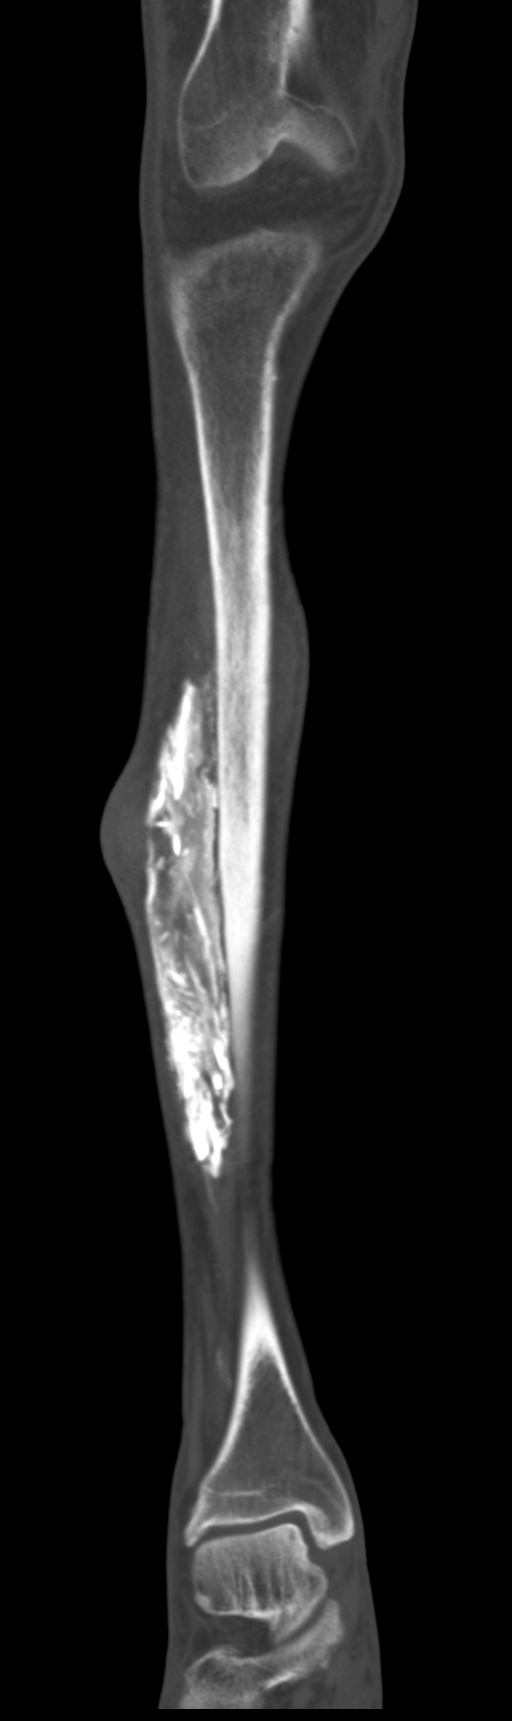
[im 48/80  bone]
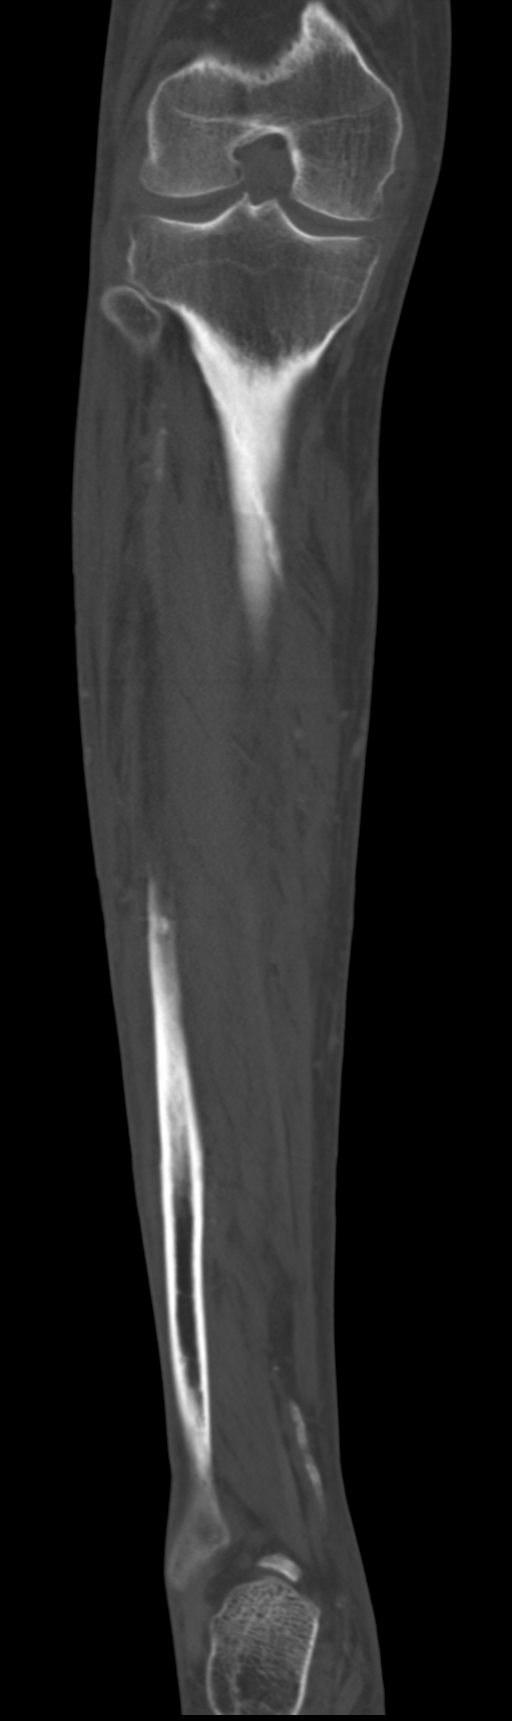

[7 of 33 positions shown; findings below may reference images not displayed]

FINDINGS: Bones/Joint/Cartilage

No acute fracture. No dislocation. No bony erosion or periosteal
elevation. No knee or ankle joint effusion. No significant
arthropathy of the knee or ankle joints.

Ligaments

Suboptimally assessed by CT.

Muscles and Tendons

Extensive intramuscular calcification throughout the tibialis
anterior and extensor digitorum longus muscles with calcification
extending approximately 25 cm in length. No intramuscular fluid
collection. Tendinous structures appear intact.

Soft tissues

Solid appearing circumscribed mass anterior to the midportion of the
tibialis anterior muscle measures 3.5 x 1.7 x 3.3 cm (series 4,
image 86; series 10, image 13). Hyperdensity within the central and
lateral aspects of this mass may reflect enhancement. There is
calcification along the posterior and lateral margins.

Peripherally enhancing collection the anterior aspect of the lower
leg which is draped over the anterior aspect of the proximal tibial
diaphysis measuring 4.3 x 1.6 x 3.2 cm (series 4, image 69) series
10, image 33). There also appears to be some enhancement or internal
mineralization within the lateral aspect of this collection or mass.

Mild subcutaneous edema anteriorly. No soft tissue gas. No deep
fascial fluid collections. Extensive arterial atherosclerosis.
IMPRESSION: 1. Solid-appearing circumscribed mass anterior to the midportion of
the tibialis anterior muscle measures up to 3.5 cm. There is
calcification along the posterior and lateral margins. There also
appears to be some enhancement or internal mineralization within the
lateral aspect of this mass or collection. Findings raise the
possibility for a soft tissue neoplasm such as sarcoma. Other
entities include but are not limited to amyloid deposition or
atypical infection. Further evaluation with ultrasound or
contrast-enhanced MRI would be helpful to distinguish cystic versus
solid process.
2. Peripherally enhancing collection or mass overlying anterior
aspect of the proximal tibial diaphysis measuring up to 4.3 cm.
There also appears to be some enhancement or internal mineralization
within the lateral aspect of this collection or mass. Abscess is not
excluded, although the internal mineralization would be atypical for
soft tissue infection.
3. Extensive intramuscular calcification throughout the tibialis
anterior and extensor digitorum longus muscles with calcification
extending approximately 25 cm in length. Findings are suggestive of
sequela of prior myositis or myonecrosis.
4. No acute osseous abnormality.  No evidence of osteomyelitis.

Ordering provider has been paged. Documentation of the communication
of the above findings will be added to the report in the form of an
addendum.

ADDENDUM:
to provider RECUERO , who verbally acknowledged these
results.

*** End of Addendum ***
FINDINGS: Bones/Joint/Cartilage

No acute fracture. No dislocation. No bony erosion or periosteal
elevation. No knee or ankle joint effusion. No significant
arthropathy of the knee or ankle joints.

Ligaments

Suboptimally assessed by CT.

Muscles and Tendons

Extensive intramuscular calcification throughout the tibialis
anterior and extensor digitorum longus muscles with calcification
extending approximately 25 cm in length. No intramuscular fluid
collection. Tendinous structures appear intact.

Soft tissues

Solid appearing circumscribed mass anterior to the midportion of the
tibialis anterior muscle measures 3.5 x 1.7 x 3.3 cm (series 4,
image 86; series 10, image 13). Hyperdensity within the central and
lateral aspects of this mass may reflect enhancement. There is
calcification along the posterior and lateral margins.

Peripherally enhancing collection the anterior aspect of the lower
leg which is draped over the anterior aspect of the proximal tibial
diaphysis measuring 4.3 x 1.6 x 3.2 cm (series 4, image 69) series
10, image 33). There also appears to be some enhancement or internal
mineralization within the lateral aspect of this collection or mass.

Mild subcutaneous edema anteriorly. No soft tissue gas. No deep
fascial fluid collections. Extensive arterial atherosclerosis.
IMPRESSION: 1. Solid-appearing circumscribed mass anterior to the midportion of
the tibialis anterior muscle measures up to 3.5 cm. There is
calcification along the posterior and lateral margins. There also
appears to be some enhancement or internal mineralization within the
lateral aspect of this mass or collection. Findings raise the
possibility for a soft tissue neoplasm such as sarcoma. Other
entities include but are not limited to amyloid deposition or
atypical infection. Further evaluation with ultrasound or
contrast-enhanced MRI would be helpful to distinguish cystic versus
solid process.
2. Peripherally enhancing collection or mass overlying anterior
aspect of the proximal tibial diaphysis measuring up to 4.3 cm.
There also appears to be some enhancement or internal mineralization
within the lateral aspect of this collection or mass. Abscess is not
excluded, although the internal mineralization would be atypical for
soft tissue infection.
3. Extensive intramuscular calcification throughout the tibialis
anterior and extensor digitorum longus muscles with calcification
extending approximately 25 cm in length. Findings are suggestive of
sequela of prior myositis or myonecrosis.
4. No acute osseous abnormality.  No evidence of osteomyelitis.

Ordering provider has been paged. Documentation of the communication
of the above findings will be added to the report in the form of an
addendum.

## 2020-10-20 SURGERY — IRRIGATION AND DEBRIDEMENT EXTREMITY
Anesthesia: General | Site: Leg Lower | Laterality: Right

## 2020-10-20 MED ORDER — POVIDONE-IODINE 10 % EX SWAB: 2.0000 "application " | Freq: Once | CUTANEOUS | Status: DC

## 2020-10-20 MED ORDER — CEFAZOLIN SODIUM-DEXTROSE 2-4 GM/100ML-% IV SOLN
INTRAVENOUS | Status: AC
  Filled 2020-10-20: qty 100

## 2020-10-20 MED ORDER — DARBEPOETIN ALFA 100 MCG/0.5ML IJ SOSY
PREFILLED_SYRINGE | INTRAMUSCULAR | Status: AC
  Administered 2020-10-20: 100 ug via INTRAVENOUS
  Filled 2020-10-20: qty 0.5

## 2020-10-20 MED ORDER — PROPOFOL 10 MG/ML IV BOLUS
INTRAVENOUS | Status: DC | PRN
  Administered 2020-10-20: 140 mg via INTRAVENOUS

## 2020-10-20 MED ORDER — SODIUM CHLORIDE 0.9 % IR SOLN
Status: DC | PRN
  Administered 2020-10-20: 1000 mL

## 2020-10-20 MED ORDER — LIDOCAINE 2% (20 MG/ML) 5 ML SYRINGE
INTRAMUSCULAR | Status: DC | PRN
  Administered 2020-10-20: 100 mg via INTRAVENOUS

## 2020-10-20 MED ORDER — DOXERCALCIFEROL 4 MCG/2ML IV SOLN
INTRAVENOUS | Status: AC
  Administered 2020-10-20: 3 ug via INTRAVENOUS
  Filled 2020-10-20: qty 2

## 2020-10-20 MED ORDER — LIDOCAINE HCL (PF) 1 % IJ SOLN: 5.0000 mL | INTRAMUSCULAR | Status: DC | PRN

## 2020-10-20 MED ORDER — MIDAZOLAM HCL 2 MG/2ML IJ SOLN
INTRAMUSCULAR | Status: DC | PRN
  Administered 2020-10-20: 1 mg via INTRAVENOUS

## 2020-10-20 MED ORDER — LIDOCAINE 2% (20 MG/ML) 5 ML SYRINGE
INTRAMUSCULAR | Status: AC
  Filled 2020-10-20: qty 5

## 2020-10-20 MED ORDER — FENTANYL CITRATE (PF) 250 MCG/5ML IJ SOLN
INTRAMUSCULAR | Status: DC | PRN
  Administered 2020-10-20 (×3): 25 ug via INTRAVENOUS

## 2020-10-20 MED ORDER — PROPOFOL 10 MG/ML IV BOLUS
INTRAVENOUS | Status: AC
  Filled 2020-10-20: qty 40

## 2020-10-20 MED ORDER — DEXAMETHASONE SODIUM PHOSPHATE 10 MG/ML IJ SOLN
INTRAMUSCULAR | Status: DC | PRN
  Administered 2020-10-20: 5 mg via INTRAVENOUS

## 2020-10-20 MED ORDER — SODIUM CHLORIDE 0.9 % IV SOLN: INTRAVENOUS | Status: DC

## 2020-10-20 MED ORDER — HEPARIN SODIUM (PORCINE) 1000 UNIT/ML DIALYSIS
1000.0000 [IU] | INTRAMUSCULAR | Status: DC | PRN
  Filled 2020-10-20: qty 1

## 2020-10-20 MED ORDER — CHLORHEXIDINE GLUCONATE 0.12 % MT SOLN
OROMUCOSAL | Status: AC
  Administered 2020-10-20: 15 mL via OROMUCOSAL
  Filled 2020-10-20: qty 15

## 2020-10-20 MED ORDER — VANCOMYCIN HCL IN DEXTROSE 500-5 MG/100ML-% IV SOLN
INTRAVENOUS | Status: AC
  Administered 2020-10-20: 500 mg
  Filled 2020-10-20: qty 100

## 2020-10-20 MED ORDER — ALTEPLASE 2 MG IJ SOLR: 2.0000 mg | Freq: Once | INTRAMUSCULAR | Status: DC | PRN

## 2020-10-20 MED ORDER — CHLORHEXIDINE GLUCONATE 0.12 % MT SOLN
15.0000 mL | Freq: Once | OROMUCOSAL | Status: AC
  Filled 2020-10-20: qty 15

## 2020-10-20 MED ORDER — ONDANSETRON HCL 4 MG/2ML IJ SOLN
INTRAMUSCULAR | Status: AC
  Filled 2020-10-20: qty 2

## 2020-10-20 MED ORDER — CHLORHEXIDINE GLUCONATE 4 % EX LIQD
60.0000 mL | Freq: Once | CUTANEOUS | Status: AC
  Administered 2020-10-20: 4 via TOPICAL

## 2020-10-20 MED ORDER — SODIUM CHLORIDE 0.9 % IR SOLN
Status: DC | PRN
  Administered 2020-10-20: 3000 mL

## 2020-10-20 MED ORDER — MIDAZOLAM HCL 2 MG/2ML IJ SOLN
INTRAMUSCULAR | Status: AC
  Filled 2020-10-20: qty 2

## 2020-10-20 MED ORDER — CEFAZOLIN SODIUM-DEXTROSE 2-4 GM/100ML-% IV SOLN
2.0000 g | INTRAVENOUS | Status: AC
  Administered 2020-10-20: 2 g via INTRAVENOUS
  Filled 2020-10-20: qty 100

## 2020-10-20 MED ORDER — FENTANYL CITRATE (PF) 250 MCG/5ML IJ SOLN
INTRAMUSCULAR | Status: AC
  Filled 2020-10-20: qty 5

## 2020-10-20 MED ORDER — SODIUM CHLORIDE 0.9 % IV SOLN: 100.0000 mL | INTRAVENOUS | Status: DC | PRN

## 2020-10-20 MED ORDER — LIDOCAINE-PRILOCAINE 2.5-2.5 % EX CREA
1.0000 "application " | TOPICAL_CREAM | CUTANEOUS | Status: DC | PRN
  Filled 2020-10-20: qty 5

## 2020-10-20 MED ORDER — ONDANSETRON HCL 4 MG/2ML IJ SOLN
INTRAMUSCULAR | Status: DC | PRN
  Administered 2020-10-20: 4 mg via INTRAVENOUS

## 2020-10-20 MED ORDER — PENTAFLUOROPROP-TETRAFLUOROETH EX AERO
1.0000 | INHALATION_SPRAY | CUTANEOUS | Status: DC | PRN
Start: 2020-10-20 — End: 2020-10-21

## 2020-10-20 MED ORDER — IOHEXOL 300 MG/ML  SOLN
100.0000 mL | Freq: Once | INTRAMUSCULAR | Status: AC | PRN
  Administered 2020-10-20: 100 mL via INTRAVENOUS

## 2020-10-20 MED ORDER — VANCOMYCIN HCL 500 MG/100ML IV SOLN
500.0000 mg | INTRAVENOUS | Status: DC
  Administered 2020-10-22: 500 mg via INTRAVENOUS
  Filled 2020-10-20 (×4): qty 100

## 2020-10-20 SURGICAL SUPPLY — 32 items
BLADE SURG 21 STRL SS (BLADE) ×2 IMPLANT
BNDG COHESIVE 6X5 TAN STRL LF (GAUZE/BANDAGES/DRESSINGS) ×2 IMPLANT
BNDG GAUZE ELAST 4 BULKY (GAUZE/BANDAGES/DRESSINGS) ×4 IMPLANT
CNTNR URN SCR LID CUP LEK RST (MISCELLANEOUS) ×1 IMPLANT
CONT SPEC 4OZ STRL OR WHT (MISCELLANEOUS) ×1
COVER SURGICAL LIGHT HANDLE (MISCELLANEOUS) ×4 IMPLANT
DRAPE U-SHAPE 47X51 STRL (DRAPES) ×2 IMPLANT
DRSG ADAPTIC 3X8 NADH LF (GAUZE/BANDAGES/DRESSINGS) ×2 IMPLANT
DRSG PAD ABDOMINAL 8X10 ST (GAUZE/BANDAGES/DRESSINGS) ×2 IMPLANT
DURAPREP 26ML APPLICATOR (WOUND CARE) ×2 IMPLANT
ELECT REM PT RETURN 9FT ADLT (ELECTROSURGICAL) ×2
ELECTRODE REM PT RTRN 9FT ADLT (ELECTROSURGICAL) ×1 IMPLANT
GAUZE SPONGE 4X4 12PLY STRL (GAUZE/BANDAGES/DRESSINGS) ×2 IMPLANT
GLOVE BIOGEL PI IND STRL 9 (GLOVE) ×1 IMPLANT
GLOVE BIOGEL PI INDICATOR 9 (GLOVE) ×1
GLOVE SURG ORTHO 9.0 STRL STRW (GLOVE) ×2 IMPLANT
GOWN STRL REUS W/ TWL XL LVL3 (GOWN DISPOSABLE) ×2 IMPLANT
GOWN STRL REUS W/TWL XL LVL3 (GOWN DISPOSABLE) ×2
HANDPIECE INTERPULSE COAX TIP (DISPOSABLE) ×1
KIT BASIN OR (CUSTOM PROCEDURE TRAY) ×2 IMPLANT
KIT TURNOVER KIT B (KITS) ×2 IMPLANT
MANIFOLD NEPTUNE II (INSTRUMENTS) ×2 IMPLANT
NS IRRIG 1000ML POUR BTL (IV SOLUTION) ×2 IMPLANT
PACK ORTHO EXTREMITY (CUSTOM PROCEDURE TRAY) ×2 IMPLANT
SET HNDPC FAN SPRY TIP SCT (DISPOSABLE) ×1 IMPLANT
SPONGE LAP 18X18 RF (DISPOSABLE) ×2 IMPLANT
STAPLER VISISTAT 35W (STAPLE) ×2 IMPLANT
STOCKINETTE IMPERVIOUS 9X36 MD (GAUZE/BANDAGES/DRESSINGS) ×2 IMPLANT
SUT ETHILON 2 0 PSLX (SUTURE) ×2 IMPLANT
TOWEL GREEN STERILE (TOWEL DISPOSABLE) ×2 IMPLANT
TUBE CONNECTING 12X1/4 (SUCTIONS) ×2 IMPLANT
YANKAUER SUCT BULB TIP NO VENT (SUCTIONS) ×2 IMPLANT

## 2020-10-20 NOTE — Plan of Care (Signed)
  Problem: Education: Goal: Knowledge of disease or condition will improve Outcome: Progressing Goal: Knowledge of secondary prevention will improve Outcome: Progressing Goal: Knowledge of patient specific risk factors addressed and post discharge goals established will improve Outcome: Progressing Goal: Individualized Educational Video(s) Outcome: Progressing   Problem: Health Behavior/Discharge Planning: Goal: Ability to manage health-related needs will improve Outcome: Progressing   Problem: Self-Care: Goal: Ability to participate in self-care as condition permits will improve Outcome: Progressing Goal: Ability to communicate needs accurately will improve Outcome: Progressing   Problem: Ischemic Stroke/TIA Tissue Perfusion: Goal: Complications of ischemic stroke/TIA will be minimized Outcome: Progressing   Problem: Education: Goal: Understanding of CV disease, CV risk reduction, and recovery process will improve Outcome: Progressing Goal: Individualized Educational Video(s) Outcome: Progressing   Problem: Activity: Goal: Ability to return to baseline activity level will improve Outcome: Progressing   Problem: Health Behavior/Discharge Planning: Goal: Ability to safely manage health-related needs after discharge will improve Outcome: Progressing   Problem: Education: Goal: Knowledge of General Education information will improve Description: Including pain rating scale, medication(s)/side effects and non-pharmacologic comfort measures Outcome: Progressing   Problem: Health Behavior/Discharge Planning: Goal: Ability to manage health-related needs will improve Outcome: Progressing   Problem: Education: Goal: Will demonstrate proper wound care and an understanding of methods to prevent future damage Outcome: Progressing Goal: Knowledge of disease or condition will improve Outcome: Progressing Goal: Knowledge of the prescribed therapeutic regimen will  improve Outcome: Progressing Goal: Individualized Educational Video(s) Outcome: Progressing   Problem: Respiratory: Goal: Respiratory status will improve Outcome: Progressing   Problem: Skin Integrity: Goal: Wound healing without signs and symptoms of infection Outcome: Progressing Goal: Risk for impaired skin integrity will decrease Outcome: Progressing   Problem: Fluid Volume: Goal: Compliance with measures to maintain balanced fluid volume will improve Outcome: Progressing   Problem: Health Behavior/Discharge Planning: Goal: Ability to manage health-related needs will improve Outcome: Progressing   Problem: Nutritional: Goal: Ability to make healthy dietary choices will improve Outcome: Progressing   Problem: Clinical Measurements: Goal: Complications related to the disease process, condition or treatment will be avoided or minimized Outcome: Progressing

## 2020-10-20 NOTE — Anesthesia Postprocedure Evaluation (Signed)
Anesthesia Post Note  Patient: Patrick Anthony  Procedure(s) Performed: IRRIGATION AND DEBRIDEMENT OF LEG (Right Leg Lower)     Patient location during evaluation: PACU Anesthesia Type: General Level of consciousness: sedated Pain management: pain level controlled Vital Signs Assessment: post-procedure vital signs reviewed and stable Respiratory status: spontaneous breathing and respiratory function stable Cardiovascular status: stable Postop Assessment: no apparent nausea or vomiting Anesthetic complications: no   No complications documented.  Last Vitals:  Vitals:   10/20/20 1705 10/20/20 1719  BP:  (!) 162/64  Pulse:  86  Resp:  16  Temp: 37 C 36.7 C  SpO2:  98%    Last Pain:  Vitals:   10/20/20 1719  TempSrc: Oral  PainSc:                  Syncere Kaminski DANIEL

## 2020-10-20 NOTE — Progress Notes (Signed)
[address]            [place] [address]          [phone]   6 Days Post-Op Procedure(s) (LRB): MINIMALLY INVASIVE MITRAL VALVE  REPLACEMENT(MVR) USING MEDTRONIC MOSAIC VALVE SIZE 31MM (Right) TRANSESOPHAGEAL ECHOCARDIOGRAM (TEE) (N/A) CLOSURE OF PATENT FORAMEN OVALE (N/A)  Total Length of Stay:  LOS: 23 days   Subjective: Awake and alert, preparing to go to the dialysis unit.  No new problems overnight.  Remains on RA.  BM yesterday.  Soreness in his right leg is about the same.     Objective: Vital signs in last 24 hours: Temp:  [97.6 F (36.4 C)-98.4 F (36.9 C)] 98.2 F (36.8 C) (03/16 0739) Pulse Rate:  [80-99] 99 (03/16 0739) Cardiac Rhythm: Normal sinus rhythm (03/15 1943) Resp:  [10-18] 16 (03/16 0739) BP: (113-146)/(82-107) 136/100 (03/16 0739) SpO2:  [96 %-100 %] 100 % (03/16 0739) Weight:  [78.6 kg] 78.6 kg (03/16 0328)  Filed Weights   10/18/20 1157 10/19/20 0500 10/20/20 0328  Weight: 76 kg 76.6 kg 78.6 kg    Weight change: 2.608 kg     Intake/Output from previous day: 03/15 0701 - 03/16 0700 In: 980 [P.O.:980] Out: 0   Intake/Output this shift: No intake/output data recorded.  Current Meds: Scheduled Meds: . aspirin EC  325 mg Oral Daily  . calcium acetate  667 mg Oral TID WC  . Chlorhexidine Gluconate Cloth  6 each Topical Q0600  . cinacalcet  30 mg Oral Q breakfast  . cloNIDine  0.3 mg Transdermal Weekly  . darbepoetin (ARANESP) injection - DIALYSIS  100 mcg Intravenous Q Wed-HD  . docusate sodium  200 mg Oral Daily  . doxercalciferol  3 mcg Intravenous Q M,W,F-HD  . enoxaparin (LOVENOX) injection  30 mg Subcutaneous Q48H  . feeding supplement (NEPRO CARB STEADY)  237 mL Oral BID BM  . gabapentin  200 mg Oral QHS  . influenza vac split quadrivalent PF  0.5 mL Intramuscular Tomorrow-1000  . metoprolol tartrate  50 mg Oral BID  . multivitamin  1 tablet Oral QHS  . pantoprazole  40 mg Oral Daily   . sodium chloride flush  3 mL Intravenous Q12H  . vancomycin variable dose per unstable renal function (pharmacist dosing)   Does not apply See admin instructions   Continuous Infusions: . sodium chloride    . sodium chloride    . sodium chloride     PRN Meds:.sodium chloride, sodium chloride, sodium chloride, acetaminophen, camphor-menthol, diphenhydrAMINE, lidocaine (PF), lidocaine-prilocaine, ondansetron **OR** ondansetron (ZOFRAN) IV, oxyCODONE, pentafluoroprop-tetrafluoroeth, senna-docusate, sodium chloride flush, traMADol  General appearance: alert, cooperative and moderate distress Neurologic: intact Heart: SR with 1st degree AV block. No arrhythmias on monitor review.  Extremities: Raised fluid-filled lesions on the right shin appear a little larger today.   Lab Results: CBC: Recent Labs    10/18/20 0833 10/20/20 0148  WBC 13.3* 11.3*  HGB 9.1* 9.2*  HCT 29.4* 28.6*  PLT 211 243   BMET:  Recent Labs    10/18/20 0148 10/20/20 0148  NA 127* 130*  K 4.3 3.9  CL 93* 93*  CO2 25 24  GLUCOSE 80 92  BUN 28* 37*  CREATININE 5.37* 6.67*  CALCIUM 8.1* 8.6*    CMET: Lab Results  Component Value Date   WBC 11.3 (H) 10/20/2020   HGB 9.2 (L)  10/20/2020   HCT 28.6 (L) 10/20/2020   PLT 243 10/20/2020   GLUCOSE 92 10/20/2020   CHOL 126 09/29/2020   TRIG 342 (H) 09/29/2020   HDL <10 (L) 09/29/2020   LDLDIRECT 25.4 09/30/2020   LDLCALC NOT CALCULATED 09/29/2020   ALT 10 10/14/2020   AST 35 10/14/2020   NA 130 (L) 10/20/2020   K 3.9 10/20/2020   CL 93 (L) 10/20/2020   CREATININE 6.67 (H) 10/20/2020   BUN 37 (H) 10/20/2020   CO2 24 10/20/2020   TSH 2.006 08/28/2013   INR 1.5 (H) 10/14/2020   HGBA1C 5.5 09/29/2020      PT/INR:  No results for input(s): LABPROT, INR in the last 72 hours. Radiology: DG Tibia/Fibula Right  EXAM: RIGHT TIBIA AND FIBULA - 2 VIEW  COMPARISON:  05/27/2019  FINDINGS: Negative for fracture.  Extensive ossification in the  soft tissues between the tibia and fibula anteriorly. This is unchanged from the prior study and likely is dystrophic calcification from prior injury. Possible muscle ossification.  There is nodular soft tissue swelling of the soft tissues anterior to the proximal tibia question injury or infection. This was not present previously  Arterial calcification  IMPRESSION: Chronic soft tissue ossification medial to the tibia anteriorly unchanged from the prior study. Likely muscle ossification.  New areas of nodular soft tissue swelling anterior to the proximal tibia. Question injury or infection.  No acute fracture.   Electronically Signed   By: Franchot Gallo M.D.   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 Doing well from CT surgery standpoint. Pacer wires are out. Progressing with mobility.   -Abscesses right lower leg- Evaluated by ortho. Plain x-ray noted above. Plan for contrast CT of RLE later today and possible I&D.    -Hypertension-BP not yet controlled on Catapres 0.3 patch and metoprolol '50mg'$  BID.  Will defer to primary team.  -ESRD / Volume excess-  Back on regular M-W-F HD schedule.   -Expected acute blood loss anemia on anemia of CKD- HCT stable.  -History of Hepatitis C with cirrhosis- ID planning outpatient follow up.   -Multiple dental caries and periodontitis- s/p multiple extractions on 3/8.  -DVT prophylaxis- on daily SQ enoxaparin   Antony Odea, PA-C [phone] 10/20/2020 7:49 AM

## 2020-10-20 NOTE — Consult Note (Signed)
ORTHOPAEDIC CONSULTATION  REQUESTING PHYSICIAN: Eugenie Filler, MD  Chief Complaint: Abscess right leg.  HPI: Patrick Anthony is a 50 y.o. male who presents with 2-week history of abscess left leg.  Patient has end-stage renal disease on dialysis with hypertension status post mitral valve replacement.  He has had over a year history of calcification between the tibia and fibula of the right leg with no history of ulcer or drainage.  Past Medical History:  Diagnosis Date  . 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 Chronic kidney disease    patient on dialysis tues-thurs-sat  . Cirrhosis of liver ([place])   . Drug addiction ([place])   . ETOH abuse   . Hepatitis   . Hypertension   . Mitral regurgitation   . Patent foramen ovale 10/23/2019  . Renal disorder renal failure  . S/P minimally-invasive mitral valve replacement with bioprosthetic valve 10/14/2020   31 mm Medtronic Mosaic stented porcine bioprosthetic tissue valve via right mini thoracotomy approach  . Sleep apnea   . 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 AV FISTULA PLACEMENT Right   . AV FISTULA PLACEMENT Left 09/03/2013   Procedure: ARTERIOVENOUS (AV) FISTULA CREATION - LEFT BRACHIOCEPHALIC;  Surgeon: Conrad Quinn, MD;  Location: [facility] OR;  Service: Vascular;  Laterality: Left;  . AV FISTULA PLACEMENT Left 10/30/2019   Procedure: INSERTION OF ARTERIOVENOUS (AV) GORE-TEX GRAFT ARM;  Surgeon: Rosetta Posner, MD;  Location: [place];  Service: Vascular;  Laterality: Left;  . BUBBLE STUDY  10/05/2020   Procedure: BUBBLE STUDY;  Surgeon: Pixie Casino, MD;  Location: [place];  Service: Cardiovascular;;  . INSERTION OF DIALYSIS CATHETER N/A 08/29/2013   Procedure: INSERTION OF DIALYSIS CATHETER;  Surgeon: Mal Misty, MD;  Location: [place];  Service:  Vascular;  Laterality: N/A;  . INSERTION OF DIALYSIS CATHETER Left 10/30/2019   Procedure: INSERTION OF DIALYSIS CATHETER LEFT INTERNAL JUGULAR;  Surgeon: Rosetta Posner, MD;  Location: [place];  Service: Vascular;  Laterality: Left;  . LEFT HEART CATH AND CORONARY ANGIOGRAPHY N/A 10/07/2020   Procedure: LEFT HEART CATH AND CORONARY ANGIOGRAPHY;  Surgeon: Lorretta Harp, MD;  Location: [place] [facility];  Service: Cardiovascular;  Laterality: N/A;  . MITRAL VALVE REPAIR Right 10/14/2020   Procedure: MINIMALLY INVASIVE MITRAL VALVE  REPLACEMENT(MVR) USING MEDTRONIC MOSAIC VALVE SIZE 31MM;  Surgeon: Rexene Alberts, MD;  Location: [place];  Service: Open Heart Surgery;  Laterality: Right;  . MULTIPLE EXTRACTIONS WITH ALVEOLOPLASTY N/A 10/12/2020   Procedure: MULTIPLE EXTRACTION WITH ALVEOLOPLASTY;  Surgeon: [name], DMD;  Location: [place];  Service: Dentistry;  Laterality: N/A;  . PERIPHERAL VASCULAR CATHETERIZATION N/A 01/06/2015   Procedure: A/V Shuntogram/Fistulagram;  Surgeon: Katha Cabal, MD;  Location: [place] [facility];  Service: Cardiovascular;  Laterality: N/A;  . PERIPHERAL VASCULAR CATHETERIZATION Left 01/06/2015   Procedure: A/V Shunt Intervention;  Surgeon: Katha Cabal, MD;  Location: [place] [facility];  Service: Cardiovascular;  Laterality: Left;  . REPAIR OF PATENT FORAMEN OVALE N/A 10/14/2020   Procedure: CLOSURE OF PATENT FORAMEN OVALE;  Surgeon: Rexene Alberts, MD;  Location: [place];  Service: Open Heart Surgery;  Laterality: N/A;  . TEE WITHOUT CARDIOVERSION N/A 10/05/2020   Procedure: TRANSESOPHAGEAL ECHOCARDIOGRAM (TEE);  Surgeon: Debara Pickett,  Nadean Corwin, MD;  Location: [facility] ENDOSCOPY;  Service: Cardiovascular;  Laterality: N/A;  . TEE WITHOUT CARDIOVERSION N/A 10/14/2020   Procedure: TRANSESOPHAGEAL ECHOCARDIOGRAM (TEE);  Surgeon: Rexene Alberts, MD;  Location: [place];  Service: Open Heart Surgery;  Laterality: N/A;   Social History   Socioeconomic History  . Marital  status: Single    Spouse name: Not on file  . Number of children: Not on file  . Years of education: Not on file  . Highest education level: Not on file  Occupational History  . Not on file  Tobacco Use  . Smoking status: Former Smoker    Packs/day: 0.20    Types: Cigarettes    Quit date: 10/05/2017    Years since quitting: 3.0  . Smokeless tobacco: Never Used  . Tobacco comment: Quit 3 years ago  Vaping Use  . Vaping Use: Never used  Substance and Sexual Activity  . Alcohol use: No  . Drug use: No    Comment: Quit  . Sexual activity: Not Currently    Birth control/protection: Condom  Other Topics Concern  . Not on file  Social History Narrative   ** Merged History Encounter **       Social Determinants of Health   Financial Resource Strain: Not on file  Food Insecurity: Not on file  Transportation Needs: Not on file  Physical Activity: Not on file  Stress: Not on file  Social Connections: Not on file   Family History  Problem Relation Age of Onset  . Hypertension Mother   . Cancer Mother        breast   - negative except otherwise stated in the family history section No Known Allergies Prior to Admission medications   Medication Sig Start Date End Date Taking? Authorizing Provider  amLODipine (NORVASC) 10 MG tablet Take 10 mg by mouth every morning. 09/24/20  Yes [provider]  AURYXIA 1 GM 210 MG(Fe) tablet Take 420 mg by mouth 3 (three) times daily. 07/20/20  Yes [provider]  calcium acetate (PHOSLO) 667 MG capsule Take 3 capsules (2,001 mg total) by mouth 3 (three) times daily with meals. Patient taking differently: Take 667-2,001 mg by mouth See admin instructions. Take 3 capsules (2001 mg) by mouth with each meal & take 1 capsule (667 mg) by mouth with each snack 09/04/13  Yes Ivor Costa, MD  cloNIDine (CATAPRES) 0.3 MG tablet Take 0.3 mg by mouth in the morning, at noon, and at bedtime.   Yes [provider]  Darbepoetin Alfa  (ARANESP, ALBUMIN FREE, IJ) Darbepoetin Alfa (Aranesp) 07/05/20 07/04/21 Yes [provider]  gabapentin (NEURONTIN) 100 MG capsule Take 200 mg by mouth at bedtime.   Yes [provider]  heparin 1000 unit/mL SOLN injection Heparin Sodium (Porcine) 1,000 Units/mL Systemic 04/23/20 04/22/21 Yes [provider]  hydrALAZINE (APRESOLINE) 100 MG tablet Take 100 mg by mouth 2 (two) times daily. 05/11/20  Yes [provider]  metoprolol tartrate (LOPRESSOR) 25 MG tablet Take 75 mg by mouth 2 (two) times daily.  04/12/16  Yes [provider]  nitroGLYCERIN (NITROSTAT) 0.4 MG SL tablet Place 0.4 mg under the tongue every 5 (five) minutes x 3 doses as needed for chest pain.    Yes [provider]  SENSIPAR 30 MG tablet Take 30 mg by mouth daily. 09/15/19  Yes [provider]  VIAGRA 50 MG tablet Take 50 mg by mouth daily as needed for erectile dysfunction.  09/24/19  Yes [provider]  oxyCODONE-acetaminophen (PERCOCET) 7.5-325 MG tablet Take 1 tablet by mouth every 4 (four) hours as needed for severe pain. Patient not taking: No sig reported 10/30/19 10/29/20  Karoline Caldwell, PA-C   DG Tibia/Fibula Right  Result Date: 10/19/2020 CLINICAL DATA:  Right anterior lower leg swelling 1.5 weeks. No history of injury. EXAM: RIGHT TIBIA AND FIBULA - 2 VIEW COMPARISON:  05/27/2019 FINDINGS: Negative for fracture. Extensive ossification in the soft tissues between the tibia and fibula anteriorly. This is unchanged from the prior study and likely is dystrophic calcification from prior injury. Possible muscle ossification. There is nodular soft tissue swelling of the soft tissues anterior to the proximal tibia question injury or infection. This was not present previously Arterial calcification IMPRESSION: Chronic soft tissue ossification medial to the tibia anteriorly unchanged from the prior study. Likely muscle ossification. New areas of nodular soft tissue  swelling anterior to the proximal tibia. Question injury or infection. No acute fracture. Electronically Signed   By: Franchot Gallo M.D.   On: 10/19/2020 15:52   - pertinent xrays, CT, MRI studies were reviewed and independently interpreted  Positive ROS: All other systems have been reviewed and were otherwise negative with the exception of those mentioned in the HPI and as above.  Physical Exam: General: Alert, no acute distress Psychiatric: Patient is competent for consent with normal mood and affect Lymphatic: No axillary or cervical lymphadenopathy Cardiovascular: No pedal edema Respiratory: No cyanosis, no use of accessory musculature GI: No organomegaly, abdomen is soft and non-tender    Images:  '@ENCIMAGES'$ @  Labs:  Lab Results  Component Value Date   HGBA1C 5.5 09/29/2020   HGBA1C 5.4 09/28/2020   HGBA1C 4.6 08/28/2013   REPTSTATUS 10/19/2020 FINAL 10/14/2020   GRAMSTAIN  10/14/2020    WBC PRESENT,BOTH PMN AND MONONUCLEAR NO ORGANISMS SEEN    CULT  10/14/2020    No growth aerobically or anaerobically. Performed at [facility], [place] [address], [place], [place] [address]    LABORGA METHICILLIN RESISTANT STAPHYLOCOCCUS AUREUS 09/27/2020    Lab Results  Component Value Date   ALBUMIN 2.1 (L) 10/20/2020   ALBUMIN 2.4 (L) 10/14/2020   ALBUMIN 2.3 (L) 10/13/2020   PREALBUMIN 10.8 (L) 10/13/2020   PREALBUMIN 12.9 (L) 10/07/2020    Neurologic: Patient does not have protective sensation bilateral lower extremities.   MUSCULOSKELETAL:   Skin: Examination patient has a multilobulated abscess over the right leg this is tender and swollen both areas of abscess are approximately 5 cm in diameter.  Patient has a strong dorsalis pedis pulse without arterial insufficiency.  Review of the radiographs shows extensive calcification between the tibia and fibula.  Patient denies any previous trauma denies any history of infection in his leg.  Patient's albumin is  2.1 hemoglobin A1c 5.5.  Assessment: Assessment: Acute abscess right leg with calcification between the tibia and fibula.  Plan: Plan: We will plan for irrigation and debridement of the abscess placement of an installation wound VAC anticipate returning to surgery on Friday.  We will obtain deep tissue for cultures as well as bone for cultures.  Thank you for the consult and the opportunity to see Mr. Sherrie Mustache, MD [facility] [phone] 8:24 AM

## 2020-10-20 NOTE — Progress Notes (Deleted)
PT Cancellation Note  Patient Details Name: Patrick Anthony MRN: [id number] DOB: [birth year]   Cancelled Treatment:    Reason Eval/Treat Not Completed: (P) Patient at procedure or test/unavailable (pt at HD in AM, then possible OR in PM per RN) Will continue efforts per PT POC as schedule permits.   [name] 10/20/2020, 9:56 AM

## 2020-10-20 NOTE — Anesthesia Preprocedure Evaluation (Addendum)
Anesthesia Evaluation  Patient identified by MRN, date of birth, ID band Patient awake    Reviewed: Allergy & Precautions, NPO status , Patient's Chart, lab work & pertinent test results  Airway Mallampati: III  TM Distance: >3 FB Neck ROM: Full    Dental   Only 3 teeth left:   Pulmonary sleep apnea , former smoker,    Pulmonary exam normal breath sounds clear to auscultation + decreased breath sounds      Cardiovascular METS: 3 - Mets hypertension, Normal cardiovascular exam+ Valvular Problems/Murmurs (h/o mitral valve repair 10/14/20)  Rhythm:Regular Rate:Normal     Neuro/Psych PSYCHIATRIC DISORDERS CVA    GI/Hepatic (+) Hepatitis -, C  Endo/Other    Renal/GU DialysisRenal disease (last dialyzed today 10/20/20)     Musculoskeletal negative musculoskeletal ROS (+)   Abdominal   Peds  Hematology   Anesthesia Other Findings   Reproductive/Obstetrics negative OB ROS                           Anesthesia Physical Anesthesia Plan  ASA: IV  Anesthesia Plan: General   Post-op Pain Management:    Induction: Intravenous  PONV Risk Score and Plan: 2 and Ondansetron  Airway Management Planned: LMA  Additional Equipment:   Intra-op Plan:   Post-operative Plan: Extubation in OR  Informed Consent: I have reviewed the patients History and Physical, chart, labs and discussed the procedure including the risks, benefits and alternatives for the proposed anesthesia with the patient or authorized representative who has indicated his/her understanding and acceptance.     Dental advisory given  Plan Discussed with: Anesthesiologist, Surgeon and CRNA  Anesthesia Plan Comments:         Anesthesia Quick Evaluation

## 2020-10-20 NOTE — Anesthesia Procedure Notes (Signed)
Procedure Name: LMA Insertion Date/Time: 10/20/2020 4:01 PM Performed by: Rande Brunt, CRNA Pre-anesthesia Checklist: Patient identified, Emergency Drugs available, Suction available and Patient being monitored Patient Re-evaluated:Patient Re-evaluated prior to induction Oxygen Delivery Method: Circle System Utilized Preoxygenation: Pre-oxygenation with 100% oxygen Induction Type: IV induction Ventilation: Mask ventilation without difficulty LMA: LMA inserted LMA Size: 4.0 Number of attempts: 1 Placement Confirmation: positive ETCO2 Tube secured with: Tape Dental Injury: Teeth and Oropharynx as per pre-operative assessment

## 2020-10-20 NOTE — Progress Notes (Signed)
Plan of care reviewed. Pt's been progressing. He's hemodynamically stable. Remained afebrile. Incision appeared dry and no drainage. Incision was cleaned with Betadine.   Pt also complained about right leg pain on the new abscess area. Pain med given. Pt was able to rest well. No acute distress noted. Awaiting for CT right Tibia fibula. We will monitor.  Kennyth Lose, RN

## 2020-10-20 NOTE — Progress Notes (Signed)
PROGRESS NOTE    Patrick Anthony  [id number] DOB: 1971/05/21 DOA: 09/27/2020 PCP: [name], MD   Chief Complaint  Patient presents with  . 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  Patient noted to have a MRSA bacteremia complicated by embolic CVA felt likely secondary to endocarditis of the mitral valve noted on 2D echo.  Patient had follow-up TEE which showed a large vegetation with no evidence of intra-arterial shunt. ID consulted and following up on blood cultures and patient placed empirically on IV vancomycin.  CT surgery consulted for surgical intervention for mitral valve endocarditis.  Patient also seen in consultation by cardiology.  Patient subsequently underwent minimally invasive mitral valve replacement per CT surgery 10/14/2020. Patient was on CT surgeons service and subsequently transferred to [facility]. -Patient seen in consultation by orthopedics as patient noted to have acute abscess of right leg with calcification between the tibia and fibula and patient for irrigation and debridement of abscess with placement of wound VAC today 10/20/2020, and likely to return to surgery on Friday. Neurology was consulted and following, ID consulted and following, CT surgery following.   Assessment & Plan:   Principal Problem:   Bacterial endocarditis Active Problems:   Chronic combined systolic and diastolic congestive heart failure (HCC)   HTN (hypertension)   Anemia in chronic kidney disease   Hepatitis C   ESRD on dialysis ([place])   End stage renal disease (HCC)   Chest pain   Sepsis ([place])   Pneumonia due to infectious agent   ADPKD  (autosomal dominant polycystic kidney disease)   Sepsis due to pneumonia (HCC)   Lactic acidosis   Cerebral embolism with cerebral infarction   Bacteremia due to Staphylococcus aureus   Mitral regurgitation   Uncontrolled hypertension   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  Currently afebrile. -Patient noted to have positive blood cultures of MRSA 09/27/2020 and 09/30/2020. -Repeat blood cultures 10/03/2020 -. -TEE 10/05/2020 with LAA thrombus in 1.5 cm MV vegetation with negative bubble study. -Status post multiple teeth extraction 10/12/2020. -Status post minimally invasive mitral valve replacement 10/14/2020 and closure of PFO. -ID noted to have reviewed operative note that showed bulky purulent vegetation.  Per ID due to recent fever 10/13/2020, operative finding and poor tissue penetration by vancomycin recommending 6 weeks of antibiotics from 10/14/2020. -Continue IV vancomycin with HD through 11/25/2020. -We will need outpatient follow-up in ID clinic post discharge (3/24 with Dr Gale Journey) -CT surgery following.  2.  Chronic hep C Genotype ordered.  Outpatient follow-up with ID for treatment.  3.  Right tib-fib abscess Patient for irrigation and debridement per Dr. Sharol Given, orthopedics today 10/20/2020 with probable surgery again on Friday, 10/22/2020.  Per orthopedics.  4.  End-stage renal disease on hemodialysis -Patient being followed by nephrology and receiving  HD during this hospitalization. -Per nephrology.  5.  Hyponatremia -On HD.  Per  nephrology.  6.  Hypertension Continue metoprolol, clonidine patch.  7.  Anemia of chronic disease Status post transfusion 2 units packed red blood cells 10/14/2020. -H&H stable.  Follow.      DVT prophylaxis: Awaiting I&D. Code Status: Full Family Communication: Updated patient.  No family at bedside. Disposition:   Status is: Inpatient    Dispo: The patient is from: Home              Anticipated d/c is to: To be determined              Patient currently on IV antibiotics for MRSA endocarditis with embolic CVAs, status post mitral valve replacement, awaiting to undergo I&D of tibial abscess.  Not stable for discharge.   Difficult to place patient undetermined       Consultants:   Orthopedics: Dr. Sharol Given 10/20/2020  Nephrology  Neurology: Dr. Rory Percy 09/28/2020  Infectious disease: Dr. Graylon Good 09/28/2020  Nephrology: Dr.Schertz 09/28/2020  CT surgery Dr. Roxy Manns 10/05/2020  Orthopedics: Dr. Percell Miller 10/19/2020  Procedures:   CT abdomen and pelvis 09/27/2020, 09/29/2020  CT head 09/28/2020  CT chest 09/29/2020  CT tib-fib 10/20/2020 pending  Plain films right tib-fib 10/19/2020  Brain 09/28/2020  MRI angiogram head and neck 09/28/2020  MRI T-spine L-spine 09/30/2020  TEE 10/05/2020  Cardiac catheterization 10/07/2020--Dr. Berry  Upper extremity Dopplers 10/08/2020  Irrigation and debridement pending lower extremities per Dr. Sharol Given 10/20/2020  Minimally invasive mitral valve replacement per Dr. Roxy Manns 10/14/2020  Antimicrobials:  Anti-infectives (From admission, onward)   Start     Dose/Rate Route Frequency Ordered Stop   10/21/20 0600  ceFAZolin (ANCEF) IVPB 2g/100 mL premix        2 g 200 mL/hr over 30 Minutes Intravenous On call to O.R. 10/20/20 1349 10/20/20 1603   10/20/20 1437  ceFAZolin (ANCEF) 2-4 GM/100ML-% IVPB       Note to Pharmacy: Ladoris Gene   : cabinet override      10/20/20 1437 10/20/20 1612   10/20/20 1200  vancomycin (VANCOREADY) IVPB 500 mg/100 mL         500 mg 100 mL/hr over 60 Minutes Intravenous Every M-W-F (Hemodialysis) 10/20/20 0903     10/18/20 1124  vancomycin variable dose per unstable renal function (pharmacist dosing)  Status:  Discontinued         Does not apply See admin instructions 10/18/20 1124 10/20/20 0903   10/16/20 1600  vancomycin (VANCOREADY) IVPB 500 mg/100 mL  Status:  Discontinued        500 mg 100 mL/hr over 60 Minutes Intravenous Every Sat (Hemodialysis) 10/16/20 1306 10/16/20 1312   10/15/20 1500  vancomycin (VANCOCIN) IVPB 500 mg/100 ml premix  Status:  Discontinued        500 mg 100 mL/hr over 60 Minutes Intravenous Every M-W-F (Hemodialysis) 10/15/20 1357 10/17/20 0942   10/14/20 2130  vancomycin (VANCOCIN) IVPB 1000 mg/200 mL premix  Status:  Discontinued        1,000 mg 200 mL/hr over 60 Minutes Intravenous  Once 10/14/20 1403 10/14/20 1547   10/14/20 2130  vancomycin (VANCOREADY) IVPB 500 mg/100 mL        500 mg 100 mL/hr over 60 Minutes Intravenous  Once 10/14/20 1547 10/14/20 2222   10/14/20 1730  cefUROXime (ZINACEF) 1.5 g in sodium chloride 0.9 % 100 mL IVPB        1.5 g 200 mL/hr over 30  Minutes Intravenous Every 24 hr x 2 10/14/20 1403 10/15/20 1718   10/14/20 1128  vancomycin (VANCOCIN) 1,000 mg in sodium chloride 0.9 % 1,000 mL irrigation  Status:  Discontinued          As needed 10/14/20 1128 10/14/20 1401   10/14/20 0400  vancomycin (VANCOREADY) IVPB 1250 mg/250 mL        1,250 mg 166.7 mL/hr over 90 Minutes Intravenous To Surgery 10/13/20 1039 10/14/20 0910   10/14/20 0400  cefUROXime (ZINACEF) 1.5 g in sodium chloride 0.9 % 100 mL IVPB        1.5 g 200 mL/hr over 30 Minutes Intravenous To Surgery 10/13/20 1039 10/14/20 0835   10/14/20 0400  cefUROXime (ZINACEF) 750 mg in sodium chloride 0.9 % 100 mL IVPB        750 mg 200 mL/hr over 30 Minutes Intravenous To Surgery 10/13/20 1039 10/14/20 1250   10/14/20 0400  vancomycin (VANCOCIN) 1,000 mg in sodium chloride 0.9 % 1,000 mL irrigation   Status:  Discontinued         Irrigation To Surgery 10/13/20 1039 10/14/20 1403   10/13/20 1657  vancomycin (VANCOCIN) 500-5 MG/100ML-% IVPB       Note to Pharmacy: Judieth Keens  : cabinet override      10/13/20 1657 10/13/20 1717   10/12/20 0600  ceFAZolin (ANCEF) IVPB 2g/100 mL premix        2 g 200 mL/hr over 30 Minutes Intravenous On call to O.R. 10/11/20 1841 10/12/20 1315   10/11/20 1415  vancomycin (VANCOCIN) IVPB 500 mg/100 ml premix  Status:  Discontinued        500 mg 100 mL/hr over 60 Minutes Intravenous Every M-W-F (Hemodialysis) 10/11/20 1413 10/14/20 1403   10/11/20 1413  vancomycin (VANCOCIN) 500-5 MG/100ML-% IVPB       Note to Pharmacy: Kalman Shan   : cabinet override      10/11/20 1413 10/12/20 0229   10/11/20 1353  Vancomycin (VANCOCIN) 750-5 MG/150ML-% IVPB  Status:  Discontinued       Note to Pharmacy: Kalman Shan   : cabinet override      10/11/20 1353 10/11/20 1417   10/08/20 1200  vancomycin (VANCOCIN) IVPB 750 mg/150 ml premix  Status:  Discontinued        750 mg 150 mL/hr over 60 Minutes Intravenous Every M-W-F (Hemodialysis) 10/05/20 1404 10/11/20 1413   10/06/20 1619  vancomycin (VANCOCIN) 500-5 MG/100ML-% IVPB       Note to Pharmacy: Judieth Keens  : cabinet override      10/06/20 1619 10/06/20 1736   10/06/20 1200  vancomycin (VANCOCIN) IVPB 750 mg/150 ml premix  Status:  Discontinued        750 mg 150 mL/hr over 60 Minutes Intravenous Every M-W-F (Hemodialysis) 10/04/20 0905 10/05/20 1404   10/06/20 1200  vancomycin (VANCOCIN) IVPB 500 mg/100 ml premix        500 mg 100 mL/hr over 60 Minutes Intravenous Every Wed (Hemodialysis) 10/05/20 1404 10/06/20 2000   10/01/20 2000  DAPTOmycin (CUBICIN) 840 mg in sodium chloride 0.9 % IVPB  Status:  Discontinued        840 mg 233.6 mL/hr over 30 Minutes Intravenous Every 48 hours 10/01/20 0922 10/01/20 1056   10/01/20 1200  vancomycin (VANCOCIN) IVPB 1000 mg/200 mL premix  Status:  Discontinued         1,000 mg 200 mL/hr over 60 Minutes Intravenous Every M-W-F (Hemodialysis) 10/01/20 1056 10/04/20 0845   09/29/20 1200  vancomycin (VANCOCIN) IVPB 1000 mg/200 mL premix  Status:  Discontinued        1,000 mg 200 mL/hr over 60 Minutes Intravenous Every M-W-F (Hemodialysis) 09/27/20 1443 10/01/20 0922   09/29/20 1200  ceFEPIme (MAXIPIME) 2 g in sodium chloride 0.9 % 100 mL IVPB  Status:  Discontinued        2 g 200 mL/hr over 30 Minutes Intravenous Every M-W-F (Hemodialysis) 09/27/20 1443 09/28/20 1054   09/28/20 0900  metroNIDAZOLE (FLAGYL) IVPB 500 mg  Status:  Discontinued        500 mg 100 mL/hr over 60 Minutes Intravenous Every 12 hours 09/27/20 1808 09/28/20 1054   09/28/20 0900  ceFEPIme (MAXIPIME) 1 g in sodium chloride 0.9 % 100 mL IVPB  Status:  Discontinued        1 g 200 mL/hr over 30 Minutes Intravenous Every 12 hours 09/27/20 1808 09/27/20 1812   09/27/20 1445  vancomycin (VANCOREADY) IVPB 2000 mg/400 mL        2,000 mg 200 mL/hr over 120 Minutes Intravenous  Once 09/27/20 1433 09/27/20 2330   09/27/20 1430  ceFEPIme (MAXIPIME) 2 g in sodium chloride 0.9 % 100 mL IVPB        2 g 200 mL/hr over 30 Minutes Intravenous  Once 09/27/20 1418 09/27/20 1541   09/27/20 1430  metroNIDAZOLE (FLAGYL) IVPB 500 mg        500 mg 100 mL/hr over 60 Minutes Intravenous  Once 09/27/20 1418 09/27/20 2214   09/27/20 1430  vancomycin (VANCOREADY) IVPB 1750 mg/350 mL  Status:  Discontinued        1,750 mg 175 mL/hr over 120 Minutes Intravenous  Once 09/27/20 1418 09/27/20 1433        Subjective: Laying in bed.  Denies any significant chest pain.  States has some intermittent shortness of breath however nothing new.  No abdominal pain.  Occasional right lower extremity pain when he moves around per patient.  Objective: Vitals:   10/19/20 1943 10/19/20 2345 10/20/20 0328 10/20/20 0739  BP: (!) 142/102 (!) 146/99 (!) 130/94 (!) 136/100  Pulse: 83 90 93 99  Resp: '18 15  16  '$ Temp: 97.9 F  (36.6 C) 98.2 F (36.8 C) 98.4 F (36.9 C) 98.2 F (36.8 C)  TempSrc: Oral Oral Oral Oral  SpO2: 100% 100% 100% 100%  Weight:   78.6 kg   Height:        Intake/Output Summary (Last 24 hours) at 10/20/2020 1027 Last data filed at 10/20/2020 0300 Gross per 24 hour  Intake 740 ml  Output -  Net 740 ml   Filed Weights   10/18/20 1157 10/19/20 0500 10/20/20 0328  Weight: 76 kg 76.6 kg 78.6 kg    Examination:  General exam: NAD Respiratory system: Minimal bibasilar crackles.  No wheezing.  No rhonchi.  Fair air movement.  Cardiovascular system: S1 & S2 heard, RRR. No JVD, murmurs, rubs, gallops or clicks. No pedal edema. Gastrointestinal system: Abdomen is nondistended, soft and nontender. No organomegaly or masses felt. Normal bowel sounds heard. Central nervous system: Alert and oriented. No focal neurological deficits. Extremities: Right lower extremity with abscesses noted on the tib-fib which are tender to palpation.   Skin: No rashes, lesions or ulcers Psychiatry: Judgement and insight appear normal. Mood & affect appropriate.     Data Reviewed: I have personally reviewed following labs and imaging studies  CBC: Recent Labs  Lab 10/15/20 0406 10/15/20 0622 10/15/20 1641 10/16/20 0324 10/18/20 [id number] 10/20/20  0148  WBC 14.2*  --  16.0* 15.5* 13.3* 11.3*  NEUTROABS  --   --   --   --   --  7.6  HGB 9.4* 9.5* 9.9* 9.6* 9.1* 9.2*  HCT 29.7* 28.0* 31.2* 29.4* 29.4* 28.6*  MCV 88.9  --  87.4 88.0 91.3 89.9  PLT 140*  --  167 160 211 0000000    Basic Metabolic Panel: Recent Labs  Lab 10/14/20 0406 10/14/20 0818 10/14/20 2022 10/14/20 2354 10/15/20 0406 10/15/20 0622 10/15/20 1641 10/16/20 0324 10/18/20 0148 10/20/20 0148  NA 132*   < > 135   < > 134* 136 134* 134* 127* 130*  K 4.6   < > 5.7*   < > 5.0 5.0 3.7 4.1 4.3 3.9  CL 94*   < > 104  --  101  --  98 97* 93* 93*  CO2 28  --  19*  --  18*  --  '24 26 25 24  '$ GLUCOSE 87   < > 95  --  122*  --  125* 99 80 92   BUN 19   < > 26*  --  30*  --  12 17 28* 37*  CREATININE 5.60*   < > 5.45*  --  6.07*  --  3.34* 4.46* 5.37* 6.67*  CALCIUM 9.1  --  7.7*  --  7.9*  --  8.4* 8.8* 8.1* 8.6*  MG 2.1  --  2.6*  --  2.6*  --  2.0  --   --   --   PHOS 5.8*  --   --   --   --   --   --   --   --  5.1*   < > = values in this interval not displayed.    GFR: Estimated Creatinine Clearance: 14.7 mL/min (A) (by C-G formula based on SCr of 6.67 mg/dL (H)).  Liver Function Tests: Recent Labs  Lab 10/14/20 0406 10/20/20 0148  AST 35  --   ALT 10  --   ALKPHOS 46  --   BILITOT 0.8  --   PROT 8.7*  --   ALBUMIN 2.4* 2.1*    CBG: Recent Labs  Lab 10/16/20 1529 10/16/20 2134 10/17/20 0625 10/17/20 0807 10/17/20 1158  GLUCAP 105* 111* 82 76 84     Recent Results (from the past 240 hour(s))  Surgical pcr screen     Status: Abnormal   Collection Time: 10/11/20  6:44 PM   Specimen: Nasal Mucosa; Nasal Swab  Result Value Ref Range Status   MRSA, PCR POSITIVE (A) NEGATIVE Final    Comment: RESULT CALLED TO, READ BACK BY AND VERIFIED WITH: F COMBRES RN 10/11/20 2339 JDW    Staphylococcus aureus POSITIVE (A) NEGATIVE Final    Comment: (NOTE) The Xpert SA Assay (FDA approved for NASAL specimens in patients 31 years of age and older), is one component of a comprehensive surveillance program. It is not intended to diagnose infection nor to guide or monitor treatment. Performed at [facility], [name] [address], [place], [place] [address]   Culture, blood (routine x 2)     Status: None   Collection Time: 10/13/20 10:43 PM   Specimen: BLOOD  Result Value Ref Range Status   Specimen Description BLOOD RIGHT THUMB  Final   Special Requests   Final    BOTTLES DRAWN AEROBIC AND ANAEROBIC Blood Culture adequate volume   Culture   Final    NO  GROWTH 5 DAYS Performed at [facility], [place] [address] [place], [place] [address]    Report Status 10/19/2020 FINAL  Final  Culture, blood (routine x  2)     Status: None   Collection Time: 10/13/20 10:43 PM   Specimen: BLOOD RIGHT HAND  Result Value Ref Range Status   Specimen Description BLOOD RIGHT HAND  Final   Special Requests   Final    BOTTLES DRAWN AEROBIC AND ANAEROBIC Blood Culture adequate volume   Culture   Final    NO GROWTH 5 DAYS Performed at [facility], [place] [address], [place], [place] [address]    Report Status 10/19/2020 FINAL  Final  Aerobic/Anaerobic Culture w Gram Stain (surgical/deep wound)     Status: None   Collection Time: 10/14/20 10:38 AM   Specimen: Soft Tissue, Other  Result Value Ref Range Status   Specimen Description TISSUE MITRAL VALVE VEGETATION SPEC A  Final   Special Requests MITRAL VALVE VEGETATION  Final   Gram Stain   Final    WBC PRESENT,BOTH PMN AND MONONUCLEAR NO ORGANISMS SEEN    Culture   Final    No growth aerobically or anaerobically. Performed at [facility], [place] [address] [place],  [address]    Report Status 10/19/2020 FINAL  Final         Radiology Studies: DG Tibia/Fibula Right  Result Date: 10/19/2020 CLINICAL DATA:  Right anterior lower leg swelling 1.5 weeks. No history of injury. EXAM: RIGHT TIBIA AND FIBULA - 2 VIEW COMPARISON:  05/27/2019 FINDINGS: Negative for fracture. Extensive ossification in the soft tissues between the tibia and fibula anteriorly. This is unchanged from the prior study and likely is dystrophic calcification from prior injury. Possible muscle ossification. There is nodular soft tissue swelling of the soft tissues anterior to the proximal tibia question injury or infection. This was not present previously Arterial calcification IMPRESSION: Chronic soft tissue ossification medial to the tibia anteriorly unchanged from the prior study. Likely muscle ossification. New areas of nodular soft tissue swelling anterior to the proximal tibia. Question injury or infection. No acute fracture. Electronically Signed   By: Franchot Gallo M.D.    On: 10/19/2020 15:52   CT TIBIA FIBULA RIGHT W CONTRAST  Result Date: 10/20/2020 CLINICAL DATA:  Lower leg soft tissue infection. Focal swelling. Concern for abscess. EXAM: CT OF THE LOWER RIGHT EXTREMITY WITH CONTRAST TECHNIQUE: Multidetector CT imaging of the lower right extremity was performed according to the standard protocol following intravenous contrast administration. CONTRAST:  137m OMNIPAQUE IOHEXOL 300 MG/ML  SOLN COMPARISON:  X-ray 10/19/2020, 05/27/2019 FINDINGS: Bones/Joint/Cartilage No acute fracture. No dislocation. No bony erosion or periosteal elevation. No knee or ankle joint effusion. No significant arthropathy of the knee or ankle joints. Ligaments Suboptimally assessed by CT. Muscles and Tendons Extensive intramuscular calcification throughout the tibialis anterior and extensor digitorum longus muscles with calcification extending approximately 25 cm in length. No intramuscular fluid collection. Tendinous structures appear intact. Soft tissues Solid appearing circumscribed mass anterior to the midportion of the tibialis anterior muscle measures 3.5 x 1.7 x 3.3 cm (series 4, image 86; series 10, image 13). Hyperdensity within the central and lateral aspects of this mass may reflect enhancement. There is calcification along the posterior and lateral margins. Peripherally enhancing collection the anterior aspect of the lower leg which is draped over the anterior aspect of the proximal tibial diaphysis measuring 4.3 x 1.6 x 3.2 cm (series 4, image 69)  series 10, image 33). There also appears to be some enhancement or internal mineralization within the lateral aspect of this collection or mass. Mild subcutaneous edema anteriorly. No soft tissue gas. No deep fascial fluid collections. Extensive arterial atherosclerosis. IMPRESSION: 1. Solid-appearing circumscribed mass anterior to the midportion of the tibialis anterior muscle measures up to 3.5 cm. There is calcification along the posterior  and lateral margins. There also appears to be some enhancement or internal mineralization within the lateral aspect of this mass or collection. Findings raise the possibility for a soft tissue neoplasm such as sarcoma. Other entities include but are not limited to amyloid deposition or atypical infection. Further evaluation with ultrasound or contrast-enhanced MRI would be helpful to distinguish cystic versus solid process. 2. Peripherally enhancing collection or mass overlying anterior aspect of the proximal tibial diaphysis measuring up to 4.3 cm. There also appears to be some enhancement or internal mineralization within the lateral aspect of this collection or mass. Abscess is not excluded, although the internal mineralization would be atypical for soft tissue infection. 3. Extensive intramuscular calcification throughout the tibialis anterior and extensor digitorum longus muscles with calcification extending approximately 25 cm in length. Findings are suggestive of sequela of prior myositis or myonecrosis. 4. No acute osseous abnormality.  No evidence of osteomyelitis. Ordering provider has been paged. Documentation of the communication of the above findings will be added to the report in the form of an addendum. Electronically Signed   By: Davina Poke D.O.   On: 10/20/2020 10:24        Scheduled Meds: . aspirin EC  325 mg Oral Daily  . calcium acetate  667 mg Oral TID WC  . chlorhexidine  60 mL Topical Once  . Chlorhexidine Gluconate Cloth  6 each Topical Q0600  . cinacalcet  30 mg Oral Q breakfast  . cloNIDine  0.3 mg Transdermal Weekly  . darbepoetin (ARANESP) injection - DIALYSIS  100 mcg Intravenous Q Wed-HD  . docusate sodium  200 mg Oral Daily  . doxercalciferol  3 mcg Intravenous Q M,W,F-HD  . enoxaparin (LOVENOX) injection  30 mg Subcutaneous Q48H  . feeding supplement (NEPRO CARB STEADY)  237 mL Oral BID BM  . gabapentin  200 mg Oral QHS  . influenza vac split quadrivalent PF   0.5 mL Intramuscular Tomorrow-1000  . metoprolol tartrate  50 mg Oral BID  . multivitamin  1 tablet Oral QHS  . pantoprazole  40 mg Oral Daily  . povidone-iodine  2 application Topical Once  . sodium chloride flush  3 mL Intravenous Q12H   Continuous Infusions: . sodium chloride    . sodium chloride    . sodium chloride    . vancomycin       LOS: 23 days    Time spent: 40 minutes    Irine Seal, MD [facility]   To contact the attending provider between 7A-7P or the covering provider during after hours 7P-7A, please log into the web site [email or web address] and access using universal [place] password for that web site. If you do not have the password, please call the hospital operator.  10/20/2020, 10:27 AM

## 2020-10-20 NOTE — Op Note (Addendum)
10/20/2020  4:29 PM  PATIENT:  Patrick Anthony    PRE-OPERATIVE DIAGNOSIS:  abscess right leg  POST-OPERATIVE DIAGNOSIS:  Same  PROCEDURE:  IRRIGATION AND DEBRIDEMENT OF LEG  SURGEON:  [name], MD  PHYSICIAN ASSISTANT:None ANESTHESIA:   General  PREOPERATIVE INDICATIONS:  Patrick Anthony is a  50 y.o. male with a diagnosis of abscess right leg who failed conservative measures and elected for surgical management.    The risks benefits and alternatives were discussed with the patient preoperatively including but not limited to the risks of infection, bleeding, nerve injury, cardiopulmonary complications, the need for revision surgery, among others, and the patient was willing to proceed.  OPERATIVE IMPLANTS: None  '@ENCIMAGES'$ @  OPERATIVE FINDINGS: Massive purulent abscess with extensive necrotic bone from the tibia consistent with chronic osteomyelitis with a draining sinus tract.  OPERATIVE PROCEDURE: Patient was brought the operating room and underwent a general anesthetic.  After adequate levels anesthesia were obtained patient's right lower extremity was prepped using DuraPrep draped into a sterile field a timeout was called.  A longitudinal incision was made over the anterior compartment directly over the fluctuant abscess.  The abscess was drained and sent for cultures.  There was a large sinus tract that went into the tibia there was a massive amount of necrotic bone associated with the tibia this was sent for cultures as well as pathology.  An area of excision of bone was approximately 7 x 3 cm.  This was irrigated with pulsatile lavage the wound was packed open with Kerlix.  Debridement type: Excisional Debridement  Side: right  Body Location: leg   Tools used for debridement: scalpel, curette and rongeur  Pre-debridement Wound size (cm):   Length: 0        Width: 0     Depth: 0   Post-debridement Wound size (cm):   Length: 7        Width: 3     Depth: 2    Debridement depth beyond dead/damaged tissue down to healthy viable tissue: no  Tissue layer involved: skin, subcutaneous tissue, muscle / fascia, bone  Nature of tissue removed: Slough, Necrotic, Devitalized Tissue, Non-viable tissue, Purulence and Other: chronic osteomyelitis  Irrigation volume: 3 liters     Irrigation fluid type: Normal Saline        DISCHARGE PLANNING:  Antibiotic duration: Continue IV antibiotics adjust according to cultures  Weightbearing: Weightbearing as tolerated  Pain medication: Opioid pathway  Dressing care/ Wound VAC: Reinforce dressing as needed  Ambulatory devices: Walker  Discharge to: Anticipate return to the operating room on Friday. Potential for above-the-knee amputation.

## 2020-10-20 NOTE — Progress Notes (Signed)
Pt received from PACU. VSS. Telemetry applied. R leg dressing clean, dry and intact. Will continue to monitor. Call light in reach.  Clyde Canterbury, RN

## 2020-10-20 NOTE — Progress Notes (Signed)
[facility] ROUNDING NOTE   Subjective:   Brief history: Status post minimally invasive mitral valve replacement 10/14/2020.  End-stage renal disease Monday Wednesday Friday dialysis.  Next dialysis treatment planned for 10/18/2020.  His  last treatment 2 L removed 10/18/2020 next treatment be 10/20/2020.  Abscess noted over right anterior tibia.  Appreciate assistance from orthopedic surgery  Blood pressure 130/94 pulse 87 temperature 98.4 O2 sats 100%   room air  Sodium 130 potassium 3.9 chloride 93 CO2 24 BUN 37 creatinine 6.6 glucose 92 calcium 8.6 phosphorus 5.1 albumin 2.1 hemoglobin 9.2  Objective:  Vital signs in last 24 hours:  Temp:  [97.6 F (36.4 C)-98.4 F (36.9 C)] 98.4 F (36.9 C) (03/16 0328) Pulse Rate:  [80-93] 93 (03/16 0328) Resp:  [10-18] 15 (03/15 2345) BP: (113-146)/(82-107) 130/94 (03/16 0328) SpO2:  [96 %-100 %] 100 % (03/16 0328) Weight:  [78.6 kg] 78.6 kg (03/16 0328)  Weight change: 2.608 kg Filed Weights   10/18/20 1157 10/19/20 0500 10/20/20 0328  Weight: 76 kg 76.6 kg 78.6 kg    Intake/Output: I/O last 3 completed shifts: In: 1480 [P.O.:1480] Out: 0    Intake/Output this shift:  No intake/output data recorded.  Gen: alert, calm today Heart:S1,S2 now with mitral click  Lungs: clear bilat, no wheezing Abdomen:OG in place. No BS.  Ext: Fluctuant lesion noted over anterior tibia right. Dialysis Access:L AVG + Bruit   Basic Metabolic Panel: Recent Labs  Lab 10/14/20 0406 10/14/20 0818 10/14/20 2022 10/14/20 2354 10/15/20 0406 10/15/20 0622 10/15/20 1641 10/16/20 0324 10/18/20 0148 10/20/20 0148  NA 132*   < > 135   < > 134* 136 134* 134* 127* 130*  K 4.6   < > 5.7*   < > 5.0 5.0 3.7 4.1 4.3 3.9  CL 94*   < > 104  --  101  --  98 97* 93* 93*  CO2 28  --  19*  --  18*  --  '24 26 25 24  '$ GLUCOSE 87   < > 95  --  122*  --  125* 99 80 92  BUN 19   < > 26*  --  30*  --  12 17 28* 37*  CREATININE 5.60*   < > 5.45*  --   6.07*  --  3.34* 4.46* 5.37* 6.67*  CALCIUM 9.1  --  7.7*  --  7.9*  --  8.4* 8.8* 8.1* 8.6*  MG 2.1  --  2.6*  --  2.6*  --  2.0  --   --   --   PHOS 5.8*  --   --   --   --   --   --   --   --  5.1*   < > = values in this interval not displayed.    Liver Function Tests: Recent Labs  Lab 10/14/20 0406 10/20/20 0148  AST 35  --   ALT 10  --   ALKPHOS 46  --   BILITOT 0.8  --   PROT 8.7*  --   ALBUMIN 2.4* 2.1*   No results for input(s): LIPASE, AMYLASE in the last 168 hours. No results for input(s): AMMONIA in the last 168 hours.  CBC: Recent Labs  Lab 10/15/20 0406 10/15/20 0622 10/15/20 1641 10/16/20 0324 10/18/20 0833 10/20/20 0148  WBC 14.2*  --  16.0* 15.5* 13.3* 11.3*  NEUTROABS  --   --   --   --   --  7.6  HGB 9.4* 9.5* 9.9* 9.6* 9.1* 9.2*  HCT 29.7* 28.0* 31.2* 29.4* 29.4* 28.6*  MCV 88.9  --  87.4 88.0 91.3 89.9  PLT 140*  --  167 160 211 243    Cardiac Enzymes: No results for input(s): CKTOTAL, CKMB, CKMBINDEX, TROPONINI in the last 168 hours.  BNP: Invalid input(s): POCBNP  CBG: Recent Labs  Lab 10/16/20 1529 10/16/20 2134 10/17/20 0625 10/17/20 0807 10/17/20 1158  GLUCAP 105* 111* 82 76 84    Microbiology: Results for orders placed or performed during the hospital encounter of 09/27/20  Blood Culture (routine x 2)     Status: Abnormal   Collection Time: 09/27/20  2:16 PM   Specimen: BLOOD RIGHT HAND  Result Value Ref Range Status   Specimen Description BLOOD RIGHT HAND  Final   Special Requests   Final    BOTTLES DRAWN AEROBIC AND ANAEROBIC Blood Culture results may not be optimal due to an inadequate volume of blood received in culture bottles Performed at [facility], [place] [address] [place], [place] [address]    Culture  Setup Time   Final    GRAM POSITIVE COCCI IN CLUSTERS IN BOTH AEROBIC AND ANAEROBIC BOTTLES    Culture METHICILLIN RESISTANT STAPHYLOCOCCUS AUREUS (A)  Final   Report Status 09/30/2020 FINAL  Final    Organism ID, Bacteria METHICILLIN RESISTANT STAPHYLOCOCCUS AUREUS  Final      Susceptibility   Methicillin resistant staphylococcus aureus - MIC*    CIPROFLOXACIN >=8 RESISTANT Resistant     ERYTHROMYCIN >=8 RESISTANT Resistant     GENTAMICIN <=0.5 SENSITIVE Sensitive     OXACILLIN >=4 RESISTANT Resistant     TETRACYCLINE <=1 SENSITIVE Sensitive     VANCOMYCIN <=0.5 SENSITIVE Sensitive     TRIMETH/SULFA <=10 SENSITIVE Sensitive     CLINDAMYCIN <=0.25 SENSITIVE Sensitive     RIFAMPIN <=0.5 SENSITIVE Sensitive     Inducible Clindamycin NEGATIVE Sensitive     * METHICILLIN RESISTANT STAPHYLOCOCCUS AUREUS  Resp Panel by RT-PCR (Flu A&B, Covid) Nasopharyngeal Swab     Status: None   Collection Time: 09/27/20  2:16 PM   Specimen: Nasopharyngeal Swab; Nasopharyngeal(NP) swabs in vial transport medium  Result Value Ref Range Status   SARS Coronavirus 2 by RT PCR NEGATIVE NEGATIVE Final    Comment: (NOTE) SARS-CoV-2 target nucleic acids are NOT DETECTED.  The SARS-CoV-2 RNA is generally detectable in upper respiratory specimens during the acute phase of infection. The lowest concentration of SARS-CoV-2 viral copies this assay can detect is 138 copies/mL. A negative result does not preclude SARS-Cov-2 infection and should not be used as the sole basis for treatment or other patient management decisions. A negative result may occur with  improper specimen collection/handling, submission of specimen other than nasopharyngeal swab, presence of viral mutation(s) within the areas targeted by this assay, and inadequate number of viral copies(<138 copies/mL). A negative result must be combined with clinical observations, patient history, and epidemiological information. The expected result is Negative.  Fact Sheet for Patients:  EntrepreneurPulse.com.au  Fact Sheet for Healthcare Providers:  IncredibleEmployment.be  This test is no t yet approved or cleared  by the [place] FDA and  has been authorized for detection and/or diagnosis of SARS-CoV-2 by FDA under an Emergency Use Authorization (EUA). This EUA will remain  in effect (meaning this test can be used) for the duration of the COVID-19 declaration under Section 564(b)(1) of the Act, 21 U.S.C.section 360bbb-3(b)(1), unless the authorization is terminated  or  revoked sooner.       Influenza A by PCR NEGATIVE NEGATIVE Final   Influenza B by PCR NEGATIVE NEGATIVE Final    Comment: (NOTE) The Xpert Xpress SARS-CoV-2/FLU/RSV plus assay is intended as an aid in the diagnosis of influenza from Nasopharyngeal swab specimens and should not be used as a sole basis for treatment. Nasal washings and aspirates are unacceptable for Xpert Xpress SARS-CoV-2/FLU/RSV testing.  Fact Sheet for Patients: EntrepreneurPulse.com.au  Fact Sheet for Healthcare Providers: IncredibleEmployment.be  This test is not yet approved or cleared by the [place] FDA and has been authorized for detection and/or diagnosis of SARS-CoV-2 by FDA under an Emergency Use Authorization (EUA). This EUA will remain in effect (meaning this test can be used) for the duration of the COVID-19 declaration under Section 564(b)(1) of the Act, 21 U.S.C. section 360bbb-3(b)(1), unless the authorization is terminated or revoked.  Performed at [facility], [place] [address] [place], [place] [address]   Blood Culture ID Panel (Reflexed)     Status: Abnormal   Collection Time: 09/27/20  2:16 PM  Result Value Ref Range Status   Enterococcus faecalis NOT DETECTED NOT DETECTED Final   Enterococcus Faecium NOT DETECTED NOT DETECTED Final   Listeria monocytogenes NOT DETECTED NOT DETECTED Final   Staphylococcus species DETECTED (A) NOT DETECTED Final    Comment: CRITICAL RESULT CALLED TO, READ BACK BY AND VERIFIED WITH: C,PIERCE PHARMD '@1009'$  09/28/20 EB    Staphylococcus aureus (BCID)  DETECTED (A) NOT DETECTED Final    Comment: Methicillin (oxacillin)-resistant Staphylococcus aureus (MRSA). MRSA is predictably resistant to beta-lactam antibiotics (except ceftaroline). Preferred therapy is vancomycin unless clinically contraindicated. Patient requires contact precautions if  hospitalized. CRITICAL RESULT CALLED TO, READ BACK BY AND VERIFIED WITH: C,PIERCE PHARMD '@1009'$  09/28/20 EB    Staphylococcus epidermidis NOT DETECTED NOT DETECTED Final   Staphylococcus lugdunensis NOT DETECTED NOT DETECTED Final   Streptococcus species NOT DETECTED NOT DETECTED Final   Streptococcus agalactiae NOT DETECTED NOT DETECTED Final   Streptococcus pneumoniae NOT DETECTED NOT DETECTED Final   Streptococcus pyogenes NOT DETECTED NOT DETECTED Final   A.calcoaceticus-baumannii NOT DETECTED NOT DETECTED Final   Bacteroides fragilis NOT DETECTED NOT DETECTED Final   Enterobacterales NOT DETECTED NOT DETECTED Final   Enterobacter cloacae complex NOT DETECTED NOT DETECTED Final   Escherichia coli NOT DETECTED NOT DETECTED Final   Klebsiella aerogenes NOT DETECTED NOT DETECTED Final   Klebsiella oxytoca NOT DETECTED NOT DETECTED Final   Klebsiella pneumoniae NOT DETECTED NOT DETECTED Final   Proteus species NOT DETECTED NOT DETECTED Final   Salmonella species NOT DETECTED NOT DETECTED Final   Serratia marcescens NOT DETECTED NOT DETECTED Final   Haemophilus influenzae NOT DETECTED NOT DETECTED Final   Neisseria meningitidis NOT DETECTED NOT DETECTED Final   Pseudomonas aeruginosa NOT DETECTED NOT DETECTED Final   Stenotrophomonas maltophilia NOT DETECTED NOT DETECTED Final   Candida albicans NOT DETECTED NOT DETECTED Final   Candida auris NOT DETECTED NOT DETECTED Final   Candida glabrata NOT DETECTED NOT DETECTED Final   Candida krusei NOT DETECTED NOT DETECTED Final   Candida parapsilosis NOT DETECTED NOT DETECTED Final   Candida tropicalis NOT DETECTED NOT DETECTED Final   Cryptococcus  neoformans/gattii NOT DETECTED NOT DETECTED Final   Meth resistant mecA/C and MREJ DETECTED (A) NOT DETECTED Final    Comment: CRITICAL RESULT CALLED TO, READ BACK BY AND VERIFIED WITH: C,PIERCE PHARMD '@1009'$  09/28/20 EB Performed at [facility], [address], [place], [place] [address]   Blood Culture (routine x 2)     Status: Abnormal   Collection Time: 09/27/20  2:29 PM   Specimen: BLOOD  Result Value Ref Range Status   Specimen Description BLOOD SITE NOT SPECIFIED  Final   Special Requests   Final    BOTTLES DRAWN AEROBIC AND ANAEROBIC Blood Culture results may not be optimal due to an inadequate volume of blood received in culture bottles   Culture  Setup Time   Final    GRAM POSITIVE COCCI IN CLUSTERS ANAEROBIC BOTTLE ONLY CRITICAL VALUE NOTED.  VALUE IS CONSISTENT WITH PREVIOUSLY REPORTED AND CALLED VALUE.    Culture (A)  Final    STAPHYLOCOCCUS AUREUS SUSCEPTIBILITIES PERFORMED ON PREVIOUS CULTURE WITHIN THE LAST 5 DAYS. Performed at [facility], [place] [address], [place], [place] [address]    Report Status 10/01/2020 FINAL  Final  Culture, blood (routine x 2)     Status: None   Collection Time: 09/30/20  6:04 AM   Specimen: BLOOD RIGHT FOREARM  Result Value Ref Range Status   Specimen Description BLOOD RIGHT FOREARM  Final   Special Requests   Final    BOTTLES DRAWN AEROBIC AND ANAEROBIC Blood Culture adequate volume   Culture   Final    NO GROWTH 5 DAYS Performed at [facility], [place] [address] [place], [place] [address]    Report Status 10/05/2020 FINAL  Final  Culture, blood (routine x 2)     Status: Abnormal   Collection Time: 09/30/20  6:08 AM   Specimen: BLOOD RIGHT HAND  Result Value Ref Range Status   Specimen Description BLOOD RIGHT HAND  Final   Special Requests   Final    BOTTLES DRAWN AEROBIC AND ANAEROBIC Blood Culture results may not be optimal due to an inadequate volume of blood received in culture bottles   Culture  Setup Time    Final    GRAM POSITIVE COCCI IN CLUSTERS ANAEROBIC BOTTLE ONLY CRITICAL VALUE NOTED.  VALUE IS CONSISTENT WITH PREVIOUSLY REPORTED AND CALLED VALUE.    Culture (A)  Final    STAPHYLOCOCCUS AUREUS SUSCEPTIBILITIES PERFORMED ON PREVIOUS CULTURE WITHIN THE LAST 5 DAYS. Performed at [facility], [place] [address], [place], [place] [address]    Report Status 10/03/2020 FINAL  Final  Culture, blood (routine x 2)     Status: None   Collection Time: 10/03/20  2:42 AM   Specimen: BLOOD  Result Value Ref Range Status   Specimen Description BLOOD LEFT ARM  Final   Special Requests   Final    BOTTLES DRAWN AEROBIC AND ANAEROBIC Blood Culture results may not be optimal due to an inadequate volume of blood received in culture bottles   Culture   Final    NO GROWTH 5 DAYS Performed at [facility], [place] [address], [place], [place] [address]    Report Status 10/08/2020 FINAL  Final  Culture, blood (routine x 2)     Status: None   Collection Time: 10/03/20  2:42 AM   Specimen: BLOOD  Result Value Ref Range Status   Specimen Description BLOOD LEFT HAND  Final   Special Requests   Final    BOTTLES DRAWN AEROBIC ONLY Blood Culture results may not be optimal due to an inadequate volume of blood received in culture bottles   Culture   Final    NO GROWTH 5 DAYS Performed at [facility], [address] [place], [place] [address]    Report Status 10/08/2020 FINAL  Final  Surgical pcr screen     Status: Abnormal   Collection Time: 10/11/20  6:44 PM   Specimen: Nasal Mucosa; Nasal Swab  Result Value Ref Range Status   MRSA, PCR POSITIVE (A) NEGATIVE Final    Comment: RESULT CALLED TO, READ BACK BY AND VERIFIED WITH: F COMBRES RN 10/11/20 2339 JDW    Staphylococcus aureus POSITIVE (A) NEGATIVE Final    Comment: (NOTE) The Xpert SA Assay (FDA approved for NASAL specimens in patients 39 years of age and older), is one component of a comprehensive surveillance program. It is not  intended to diagnose infection nor to guide or monitor treatment. Performed at [facility], [place] [address], [place], [place] [address]   Culture, blood (routine x 2)     Status: None   Collection Time: 10/13/20 10:43 PM   Specimen: BLOOD  Result Value Ref Range Status   Specimen Description BLOOD RIGHT THUMB  Final   Special Requests   Final    BOTTLES DRAWN AEROBIC AND ANAEROBIC Blood Culture adequate volume   Culture   Final    NO GROWTH 5 DAYS Performed at [facility], [place] [address], [place], [place] [address]    Report Status 10/19/2020 FINAL  Final  Culture, blood (routine x 2)     Status: None   Collection Time: 10/13/20 10:43 PM   Specimen: BLOOD RIGHT HAND  Result Value Ref Range Status   Specimen Description BLOOD RIGHT HAND  Final   Special Requests   Final    BOTTLES DRAWN AEROBIC AND ANAEROBIC Blood Culture adequate volume   Culture   Final    NO GROWTH 5 DAYS Performed at [facility], [place] [address], [place], [place] [address]    Report Status 10/19/2020 FINAL  Final  Aerobic/Anaerobic Culture w Gram Stain (surgical/deep wound)     Status: None   Collection Time: 10/14/20 10:38 AM   Specimen: Soft Tissue, Other  Result Value Ref Range Status   Specimen Description TISSUE MITRAL VALVE VEGETATION SPEC A  Final   Special Requests MITRAL VALVE VEGETATION  Final   Gram Stain   Final    WBC PRESENT,BOTH PMN AND MONONUCLEAR NO ORGANISMS SEEN    Culture   Final    No growth aerobically or anaerobically. Performed at [facility], [place] [address] [place], [place] [address]    Report Status 10/19/2020 FINAL  Final    Coagulation Studies: No results for input(s): LABPROT, INR in the last 72 hours.  Urinalysis: No results for input(s): COLORURINE, LABSPEC, PHURINE, GLUCOSEU, HGBUR, BILIRUBINUR, KETONESUR, PROTEINUR, UROBILINOGEN, NITRITE, LEUKOCYTESUR in the last 72 hours.  Invalid input(s): APPERANCEUR    Imaging: DG Tibia/Fibula  Right  Result Date: 10/19/2020 CLINICAL DATA:  Right anterior lower leg swelling 1.5 weeks. No history of injury. EXAM: RIGHT TIBIA AND FIBULA - 2 VIEW COMPARISON:  05/27/2019 FINDINGS: Negative for fracture. Extensive ossification in the soft tissues between the tibia and fibula anteriorly. This is unchanged from the prior study and likely is dystrophic calcification from prior injury. Possible muscle ossification. There is nodular soft tissue swelling of the soft tissues anterior to the proximal tibia question injury or infection. This was not present previously Arterial calcification IMPRESSION: Chronic soft tissue ossification medial to the tibia anteriorly unchanged from the prior study. Likely muscle ossification. New areas of nodular soft tissue swelling anterior  to the proximal tibia. Question injury or infection. No acute fracture. Electronically Signed   By: Franchot Gallo M.D.   On: 10/19/2020 15:52     Medications:   . sodium chloride    . sodium chloride    . sodium chloride     . aspirin EC  325 mg Oral Daily  . calcium acetate  667 mg Oral TID WC  . Chlorhexidine Gluconate Cloth  6 each Topical Q0600  . cinacalcet  30 mg Oral Q breakfast  . cloNIDine  0.3 mg Transdermal Weekly  . darbepoetin (ARANESP) injection - DIALYSIS  100 mcg Intravenous Q Wed-HD  . docusate sodium  200 mg Oral Daily  . doxercalciferol  3 mcg Intravenous Q M,W,F-HD  . enoxaparin (LOVENOX) injection  30 mg Subcutaneous Q48H  . feeding supplement (NEPRO CARB STEADY)  237 mL Oral BID BM  . gabapentin  200 mg Oral QHS  . influenza vac split quadrivalent PF  0.5 mL Intramuscular Tomorrow-1000  . metoprolol tartrate  50 mg Oral BID  . multivitamin  1 tablet Oral QHS  . pantoprazole  40 mg Oral Daily  . sodium chloride flush  3 mL Intravenous Q12H  . vancomycin variable dose per unstable renal function (pharmacist dosing)   Does not apply See admin instructions   sodium chloride, sodium chloride, sodium  chloride, acetaminophen, camphor-menthol, diphenhydrAMINE, lidocaine (PF), lidocaine-prilocaine, ondansetron **OR** ondansetron (ZOFRAN) IV, oxyCODONE, pentafluoroprop-tetrafluoroeth, senna-docusate, sodium chloride flush, traMADol  Assessment/ Plan:  OP HD:MWF [facility] 4h 350/500 new edw here 67- 75kg (was 82.5kg)2/2.5 bath LUA AVG   Hep 5000 -Aranesp 14mgIVq 2wks - last 09/27/20 (Verified order) -Hectorol347m IV qHD   Assessment/Plan: 1. 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 Doing well.   2. Volumeexcess - resolved.  Next dialysis planned for 10/20/2020 3. HTN - continues on clonidine patch and metoprolol 25 bid 4. 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 S/Ptooth extraction3/8/22.Per ID to get 6 wks IV vanc w/ new end date of 11/25/20.  5. ESRD - usual HD MWF.  Next dialysis 10/20/2020 6. Anemiaof CKD-HGB 9- 10 range. S/P 2 units of PRBCs 3/10.  7. Secondary Hyperparathyroidism - sensipar, binders. Continue hect 3ug tiw 8. Hx Hep C /liver cirrhosis. 9. Abscess anterior shin per orthopedics    LOS: 23[place]'@TODAY'e'@7'$ :13 AM

## 2020-10-20 NOTE — Transfer of Care (Signed)
Immediate Anesthesia Transfer of Care Note  Patient: Patrick Anthony  Procedure(s) Performed: IRRIGATION AND DEBRIDEMENT OF LEG (Right )  Patient Location: PACU  Anesthesia Type:General  Level of Consciousness: drowsy and responds to stimulation  Airway & Oxygen Therapy: Patient Spontanous Breathing and Patient connected to face mask oxygen  Post-op Assessment: Report given to RN, Post -op Vital signs reviewed and stable and Patient moving all extremities  Post vital signs: Reviewed and stable  Last Vitals:  Vitals Value Taken Time  BP 100/61 10/20/20 1634  Temp    Pulse 82 10/20/20 1635  Resp 18 10/20/20 1635  SpO2 96 % 10/20/20 1635  Vitals shown include unvalidated device data.  Last Pain:  Vitals:   10/20/20 1334  TempSrc:   PainSc: 6       Patients Stated Pain Goal: 4 ([id number])  Complications: No complications documented.

## 2020-10-20 NOTE — Progress Notes (Signed)
PT Cancellation Note  Patient Details Name: Jaquay Lucio MRN: [id number] DOB: 09/11/1970   Cancelled Treatment:    Reason Eval/Treat Not Completed: (P) Other (comment) (pt being prepped for OR for I&D.) Will continue efforts later in day per PT POC as schedule permits.   Kara Pacer Lillias Difrancesco 10/20/2020, 10:31 AM

## 2020-10-20 NOTE — Progress Notes (Addendum)
Pharmacy Antibiotic Note  Patrick Anthony is a 50 y.o. male admitted on 09/27/2020 with fever, chills, and generalized weakness.  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  Pt has ESRD with HD MWF.  Plans are for 6 weeks of antibiotics with day 1 being post-op day 1 from valve surgery per 3/7 ID note  3/10 > (4/21).  Vancomycin random this morning came back at goal at 20. WBC 11.3, afebrile. Remains anuric.   Plan: -Continue vancomycin 500 mg IV qHD  -Will continue to follow HD schedule/duration  Height: 6' (182.9 cm) Weight: 78.6 kg (173 lb 4.8 oz) IBW/kg (Calculated) : 77.6  Temp (24hrs), Avg:98 F (36.7 C), Min:97.6 F (36.4 C), Max:98.4 F (36.9 C)  Recent Labs  Lab 10/15/20 0406 10/15/20 1641 10/16/20 0324 10/16/20 2252 10/18/20 0148 10/18/20 0833 10/20/20 0148  WBC 14.2* 16.0* 15.5*  --   --  13.3* 11.3*  CREATININE 6.07* 3.34* 4.46*  --  5.37*  --  6.67*  VANCORANDOM  --   --   --  29  --   --  20    Estimated Creatinine Clearance: 14.7 mL/min (A) (by C-G formula based on SCr of 6.67 mg/dL (H)).    No Known Allergies  Antimicrobials this admission: Vanc 2/21>> Cefepime 2/21>>2/22 Flagyl 2/21>>2/22 (2 doses given)  Dose adjustments this admission: 2/28 Pre-HD VR 41 - estimate post HD level 27 Dose held 2/28 and reduced to '750mg'$  IV qHD starting 3/2 3/7 pre HD VR= 29; change vanc to '500mg'$  w/ HD 3/13 Post-HD VR 29 - no additional dose (did get additional dose of vanc around time of surgery) 3/16 Pre-HD VR 20 - continue with 500 IV qHD  Microbiology results: 2/21 blood x 2: MRSA, sens Vanc MIC < 0.5, Septra, TCN, gent MIC < 0.5, Clinda 2/22 BCID: MRSA  2/21 COVID and flu: negative 2/21 HIV: non-reactive 2/24 blood x 2: 1/2 SA 2/27 blood x 2: neg   Patrick Anthony, PharmD, BCCCP Clinical Pharmacist  Phone: [phone] 10/20/2020 8:56 AM  Please check AMION for all [place] phone numbers After 10:00 PM, call  [place] [phone]

## 2020-10-21 ENCOUNTER — Encounter (HOSPITAL_COMMUNITY): Payer: Self-pay | Admitting: Orthopedic Surgery

## 2020-10-21 DIAGNOSIS — J189 Pneumonia, unspecified organism: Secondary | ICD-10-CM | POA: Diagnosis not present

## 2020-10-21 DIAGNOSIS — R531 Weakness: Secondary | ICD-10-CM | POA: Diagnosis not present

## 2020-10-21 DIAGNOSIS — B182 Chronic viral hepatitis C: Secondary | ICD-10-CM | POA: Diagnosis not present

## 2020-10-21 DIAGNOSIS — N2581 Secondary hyperparathyroidism of renal origin: Secondary | ICD-10-CM | POA: Diagnosis not present

## 2020-10-21 DIAGNOSIS — J984 Other disorders of lung: Secondary | ICD-10-CM | POA: Diagnosis not present

## 2020-10-21 DIAGNOSIS — A419 Sepsis, unspecified organism: Secondary | ICD-10-CM | POA: Diagnosis not present

## 2020-10-21 DIAGNOSIS — N186 End stage renal disease: Secondary | ICD-10-CM | POA: Diagnosis not present

## 2020-10-21 DIAGNOSIS — Z952 Presence of prosthetic heart valve: Secondary | ICD-10-CM | POA: Diagnosis not present

## 2020-10-21 DIAGNOSIS — R7881 Bacteremia: Secondary | ICD-10-CM | POA: Diagnosis not present

## 2020-10-21 DIAGNOSIS — I6310 Cerebral infarction due to embolism of unspecified precerebral artery: Secondary | ICD-10-CM | POA: Diagnosis not present

## 2020-10-21 DIAGNOSIS — I5042 Chronic combined systolic (congestive) and diastolic (congestive) heart failure: Secondary | ICD-10-CM | POA: Diagnosis not present

## 2020-10-21 DIAGNOSIS — Z992 Dependence on renal dialysis: Secondary | ICD-10-CM | POA: Diagnosis not present

## 2020-10-21 DIAGNOSIS — R0602 Shortness of breath: Secondary | ICD-10-CM | POA: Diagnosis not present

## 2020-10-21 DIAGNOSIS — I10 Essential (primary) hypertension: Secondary | ICD-10-CM | POA: Diagnosis not present

## 2020-10-21 DIAGNOSIS — I5032 Chronic diastolic (congestive) heart failure: Secondary | ICD-10-CM | POA: Diagnosis not present

## 2020-10-21 DIAGNOSIS — I330 Acute and subacute infective endocarditis: Secondary | ICD-10-CM | POA: Diagnosis not present

## 2020-10-21 DIAGNOSIS — L02415 Cutaneous abscess of right lower limb: Secondary | ICD-10-CM | POA: Diagnosis not present

## 2020-10-21 DIAGNOSIS — I639 Cerebral infarction, unspecified: Secondary | ICD-10-CM | POA: Diagnosis not present

## 2020-10-21 DIAGNOSIS — I132 Hypertensive heart and chronic kidney disease with heart failure and with stage 5 chronic kidney disease, or end stage renal disease: Secondary | ICD-10-CM | POA: Diagnosis not present

## 2020-10-21 DIAGNOSIS — D631 Anemia in chronic kidney disease: Secondary | ICD-10-CM | POA: Diagnosis not present

## 2020-10-21 LAB — RENAL FUNCTION PANEL
Albumin: 2.1 g/dL — ABNORMAL LOW (ref 3.5–5.0)
Anion gap: 15 (ref 5–15)
BUN: 22 mg/dL — ABNORMAL HIGH (ref 6–20)
CO2: 23 mmol/L (ref 22–32)
Calcium: 8.3 mg/dL — ABNORMAL LOW (ref 8.9–10.3)
Chloride: 94 mmol/L — ABNORMAL LOW (ref 98–111)
Creatinine, Ser: 4.69 mg/dL — ABNORMAL HIGH (ref 0.61–1.24)
GFR, Estimated: 14 mL/min — ABNORMAL LOW (ref >60)
Glucose, Bld: 103 mg/dL — ABNORMAL HIGH (ref 70–99)
Phosphorus: 4.3 mg/dL (ref 2.5–4.6)
Potassium: 3.9 mmol/L (ref 3.5–5.1)
Sodium: 132 mmol/L — ABNORMAL LOW (ref 135–145)

## 2020-10-21 LAB — CBC WITH DIFFERENTIAL/PLATELET
Abs Immature Granulocytes: 0.15 10*3/uL — ABNORMAL HIGH (ref 0.00–0.07)
Basophils Absolute: 0 10*3/uL (ref 0.0–0.1)
Basophils Relative: 0 %
Eosinophils Absolute: 0 10*3/uL (ref 0.0–0.5)
Eosinophils Relative: 0 %
HCT: 29.9 % — ABNORMAL LOW (ref 39.0–52.0)
Hemoglobin: 9.7 g/dL — ABNORMAL LOW (ref 13.0–17.0)
Immature Granulocytes: 2 %
Lymphocytes Relative: 7 %
Lymphs Abs: 0.7 10*3/uL (ref 0.7–4.0)
MCH: 28.6 pg (ref 26.0–34.0)
MCHC: 32.4 g/dL (ref 30.0–36.0)
MCV: 88.2 fL (ref 80.0–100.0)
Monocytes Absolute: 1.1 10*3/uL — ABNORMAL HIGH (ref 0.1–1.0)
Monocytes Relative: 11 %
Neutro Abs: 8.1 10*3/uL — ABNORMAL HIGH (ref 1.7–7.7)
Neutrophils Relative %: 80 %
Platelets: 92 10*3/uL — ABNORMAL LOW (ref 150–400)
RBC: 3.39 MIL/uL — ABNORMAL LOW (ref 4.22–5.81)
RDW: 18 % — ABNORMAL HIGH (ref 11.5–15.5)
WBC: 10.1 10*3/uL (ref 4.0–10.5)
nRBC: 0 % (ref 0.0–0.2)

## 2020-10-21 LAB — CBC
HCT: 29.6 % — ABNORMAL LOW (ref 39.0–52.0)
Hemoglobin: 9.6 g/dL — ABNORMAL LOW (ref 13.0–17.0)
MCH: 29.1 pg (ref 26.0–34.0)
MCHC: 32.4 g/dL (ref 30.0–36.0)
MCV: 89.7 fL (ref 80.0–100.0)
Platelets: 332 10*3/uL (ref 150–400)
RBC: 3.3 MIL/uL — ABNORMAL LOW (ref 4.22–5.81)
RDW: 18.3 % — ABNORMAL HIGH (ref 11.5–15.5)
WBC: 12 10*3/uL — ABNORMAL HIGH (ref 4.0–10.5)
nRBC: 0 % (ref 0.0–0.2)

## 2020-10-21 LAB — IRON AND TIBC
Iron: 23 ug/dL — ABNORMAL LOW (ref 45–182)
Saturation Ratios: 12 % — ABNORMAL LOW (ref 17.9–39.5)
TIBC: 193 ug/dL — ABNORMAL LOW (ref 250–450)
UIBC: 170 ug/dL

## 2020-10-21 MED ORDER — DARBEPOETIN ALFA 60 MCG/0.3ML IJ SOSY: 60.0000 ug | PREFILLED_SYRINGE | INTRAMUSCULAR | Status: DC

## 2020-10-21 MED ORDER — CHLORHEXIDINE GLUCONATE CLOTH 2 % EX PADS
6.0000 | MEDICATED_PAD | Freq: Every day | CUTANEOUS | Status: DC
  Administered 2020-10-25 – 2020-10-26 (×2): 6 via TOPICAL

## 2020-10-21 MED ORDER — NEPRO/CARBSTEADY PO LIQD
237.0000 mL | Freq: Three times a day (TID) | ORAL | Status: DC
  Administered 2020-10-21 – 2020-11-09 (×34): 237 mL via ORAL

## 2020-10-21 MED ORDER — PROSOURCE PLUS PO LIQD
30.0000 mL | Freq: Two times a day (BID) | ORAL | Status: DC
  Administered 2020-10-21 – 2020-11-08 (×15): 30 mL via ORAL
  Filled 2020-10-21 (×10): qty 30

## 2020-10-21 NOTE — Progress Notes (Signed)
PROGRESS NOTE    Patrick Anthony  [id number] DOB: March 10, 1971 DOA: 09/27/2020 PCP: [name], MD   Chief Complaint  Patient presents with  . 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  Patient noted to have a MRSA bacteremia complicated by embolic CVA felt likely secondary to endocarditis of the mitral valve noted on 2D echo.  Patient had follow-up TEE which showed a large vegetation with no evidence of intra-arterial shunt. ID consulted and following up on blood cultures and patient placed empirically on IV vancomycin.  CT surgery consulted for surgical intervention for mitral valve endocarditis.  Patient also seen in consultation by cardiology.  Patient subsequently underwent minimally invasive mitral valve replacement per CT surgery 10/14/2020. Patient was on CT surgeons service and subsequently transferred to [facility]. -Patient seen in consultation by orthopedics as patient noted to have acute abscess of right leg with calcification between the tibia and fibula and patient for irrigation and debridement of abscess with placement of wound VAC today 10/20/2020, and likely to return to surgery on Friday. Neurology was consulted and following, ID consulted and following, CT surgery following.   Assessment & Plan:   Principal Problem:   Bacterial endocarditis Active Problems:   Chronic combined systolic and diastolic congestive heart failure (HCC)   HTN (hypertension)   Anemia in chronic kidney disease   Hepatitis C   ESRD on dialysis ([place])   End stage renal disease (HCC)   Chest pain   Sepsis ([place])   Pneumonia due to infectious agent   ADPKD  (autosomal dominant polycystic kidney disease)   Sepsis due to pneumonia (HCC)   Lactic acidosis   Cerebral embolism with cerebral infarction   Bacteremia due to Staphylococcus aureus   Mitral regurgitation   Uncontrolled hypertension   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  Currently afebrile. -Patient noted to have positive blood cultures of MRSA 09/27/2020 and 09/30/2020. -Repeat blood cultures 10/03/2020 -. -TEE 10/05/2020 with LAA thrombus in 1.5 cm MV vegetation with negative bubble study. -Status post multiple teeth extraction 10/12/2020. -Status post minimally invasive mitral valve replacement 10/14/2020 and closure of PFO. -ID noted to have reviewed operative note that showed bulky purulent vegetation.  Per ID due to recent fever 10/13/2020, operative finding and poor tissue penetration by vancomycin recommending 6 weeks of antibiotics from 10/14/2020. -Continue IV vancomycin with HD through 11/25/2020. -We will need outpatient follow-up in ID clinic post discharge (3/24 with Dr Gale Journey) -CT surgery following.  2.  Chronic hep C Genotype ordered.  Outpatient follow-up with ID for treatment.  3.  Right tib-fib abscess Patient s/p irrigation and debridement (10/20/2020) per Dr. Sharol Given, orthopedics with probable surgery  intervention again on Friday, 10/22/2020 pending pathology and culture results.  Continue IV vancomycin.  Per  orthopedics.  4.  End-stage renal disease on hemodialysis -Patient being followed by nephrology and receiving HD during this hospitalization. -Per nephrology.  5.  Hyponatremia -Improved post hemodialysis.  Per nephrology.  6.  Hypertension Controlled on current regimen of metoprolol and clonidine patch.    7.  Anemia of chronic disease Status post transfusion 2 units packed red blood cells 10/14/2020. -Anemia panel with iron level of 23, TIBC BC of 193.  Hemoglobin currently stable at 9.6.  Follow H&H.  Transfusion threshold hemoglobin < 7. Follow.      DVT prophylaxis: Lovenox. Code Status: Full Family Communication: Updated patient.  No family at bedside. Disposition:   Status is: Inpatient    Dispo: The patient is from: Home              Anticipated d/c is to: To be determined              Patient currently on IV antibiotics for MRSA endocarditis with embolic CVAs, status post mitral valve replacement, awaiting to s/p I&D of tibial abscess (10/20/2020) and patient for further surgical intervention in the next day or 2 pending pathology and culture results per orthopedics..  Not stable for discharge.   Difficult to place patient undetermined       Consultants:   Orthopedics: Dr. Sharol Given 10/20/2020  Nephrology  Neurology: Dr. Rory Percy 09/28/2020  Infectious disease: Dr. Graylon Good 09/28/2020  Nephrology: Dr.Schertz 09/28/2020  CT surgery Dr. Roxy Manns 10/05/2020  Orthopedics: Dr. Percell Miller 10/19/2020  Procedures:   CT abdomen and pelvis 09/27/2020, 09/29/2020  CT head 09/28/2020  CT chest 09/29/2020  CT tib-fib 10/20/2020 pending  Plain films right tib-fib 10/19/2020  Brain 09/28/2020  MRI angiogram head and neck 09/28/2020  MRI T-spine L-spine 09/30/2020  TEE 10/05/2020  Cardiac catheterization 10/07/2020--Dr. Berry  Upper extremity Dopplers 10/08/2020  Irrigation and debridement right lower extremities per Dr. Sharol Given 10/20/2020  Minimally invasive mitral valve replacement per  Dr. Roxy Manns 10/14/2020  Antimicrobials:  Anti-infectives (From admission, onward)   Start     Dose/Rate Route Frequency Ordered Stop   10/21/20 0600  ceFAZolin (ANCEF) IVPB 2g/100 mL premix        2 g 200 mL/hr over 30 Minutes Intravenous On call to O.R. 10/20/20 1349 10/21/20 0650   10/20/20 2243  vancomycin (VANCOCIN) 500-5 MG/100ML-% IVPB       Note to Pharmacy: Wallace Cullens   : cabinet override      10/20/20 2243 10/20/20 2243   10/20/20 1437  ceFAZolin (ANCEF) 2-4 GM/100ML-% IVPB       Note to Pharmacy: Ladoris Gene   : cabinet override      10/20/20 1437 10/20/20 1612   10/20/20 1200  vancomycin (VANCOREADY) IVPB 500 mg/100 mL        500 mg 100 mL/hr over 60 Minutes Intravenous Every M-W-F (Hemodialysis) 10/20/20 0903     10/18/20 1124  vancomycin variable dose per unstable renal function (pharmacist dosing)  Status:  Discontinued         Does not apply See admin instructions 10/18/20 1124 10/20/20 0903   10/16/20 1600  vancomycin (VANCOREADY) IVPB 500 mg/100 mL  Status:  Discontinued        500 mg 100 mL/hr over 60 Minutes Intravenous Every Sat (Hemodialysis) 10/16/20 1306 10/16/20 1312   10/15/20 1500  vancomycin (VANCOCIN) IVPB 500 mg/100 ml premix  Status:  Discontinued  500 mg 100 mL/hr over 60 Minutes Intravenous Every M-W-F (Hemodialysis) 10/15/20 1357 10/17/20 0942   10/14/20 2130  vancomycin (VANCOCIN) IVPB 1000 mg/200 mL premix  Status:  Discontinued        1,000 mg 200 mL/hr over 60 Minutes Intravenous  Once 10/14/20 1403 10/14/20 1547   10/14/20 2130  vancomycin (VANCOREADY) IVPB 500 mg/100 mL        500 mg 100 mL/hr over 60 Minutes Intravenous  Once 10/14/20 1547 10/14/20 2222   10/14/20 1730  cefUROXime (ZINACEF) 1.5 g in sodium chloride 0.9 % 100 mL IVPB        1.5 g 200 mL/hr over 30 Minutes Intravenous Every 24 hr x 2 10/14/20 1403 10/15/20 1718   10/14/20 1128  vancomycin (VANCOCIN) 1,000 mg in sodium chloride 0.9 % 1,000 mL irrigation  Status:   Discontinued          As needed 10/14/20 1128 10/14/20 1401   10/14/20 0400  vancomycin (VANCOREADY) IVPB 1250 mg/250 mL        1,250 mg 166.7 mL/hr over 90 Minutes Intravenous To Surgery 10/13/20 1039 10/14/20 0910   10/14/20 0400  cefUROXime (ZINACEF) 1.5 g in sodium chloride 0.9 % 100 mL IVPB        1.5 g 200 mL/hr over 30 Minutes Intravenous To Surgery 10/13/20 1039 10/14/20 0835   10/14/20 0400  cefUROXime (ZINACEF) 750 mg in sodium chloride 0.9 % 100 mL IVPB        750 mg 200 mL/hr over 30 Minutes Intravenous To Surgery 10/13/20 1039 10/14/20 1250   10/14/20 0400  vancomycin (VANCOCIN) 1,000 mg in sodium chloride 0.9 % 1,000 mL irrigation  Status:  Discontinued         Irrigation To Surgery 10/13/20 1039 10/14/20 1403   10/13/20 1657  vancomycin (VANCOCIN) 500-5 MG/100ML-% IVPB       Note to Pharmacy: Judieth Keens  : cabinet override      10/13/20 1657 10/13/20 1717   10/12/20 0600  ceFAZolin (ANCEF) IVPB 2g/100 mL premix        2 g 200 mL/hr over 30 Minutes Intravenous On call to O.R. 10/11/20 1841 10/12/20 1315   10/11/20 1415  vancomycin (VANCOCIN) IVPB 500 mg/100 ml premix  Status:  Discontinued        500 mg 100 mL/hr over 60 Minutes Intravenous Every M-W-F (Hemodialysis) 10/11/20 1413 10/14/20 1403   10/11/20 1413  vancomycin (VANCOCIN) 500-5 MG/100ML-% IVPB       Note to Pharmacy: Kalman Shan   : cabinet override      10/11/20 1413 10/12/20 0229   10/11/20 1353  Vancomycin (VANCOCIN) 750-5 MG/150ML-% IVPB  Status:  Discontinued       Note to Pharmacy: Kalman Shan   : cabinet override      10/11/20 1353 10/11/20 1417   10/08/20 1200  vancomycin (VANCOCIN) IVPB 750 mg/150 ml premix  Status:  Discontinued        750 mg 150 mL/hr over 60 Minutes Intravenous Every M-W-F (Hemodialysis) 10/05/20 1404 10/11/20 1413   10/06/20 1619  vancomycin (VANCOCIN) 500-5 MG/100ML-% IVPB       Note to Pharmacy: Judieth Keens  : cabinet override      10/06/20 1619 10/06/20 1736    10/06/20 1200  vancomycin (VANCOCIN) IVPB 750 mg/150 ml premix  Status:  Discontinued        750 mg 150 mL/hr over 60 Minutes Intravenous Every M-W-F (Hemodialysis) 10/04/20 0905 10/05/20 1404   10/06/20  1200  vancomycin (VANCOCIN) IVPB 500 mg/100 ml premix        500 mg 100 mL/hr over 60 Minutes Intravenous Every Wed (Hemodialysis) 10/05/20 1404 10/06/20 2000   10/01/20 2000  DAPTOmycin (CUBICIN) 840 mg in sodium chloride 0.9 % IVPB  Status:  Discontinued        840 mg 233.6 mL/hr over 30 Minutes Intravenous Every 48 hours 10/01/20 0922 10/01/20 1056   10/01/20 1200  vancomycin (VANCOCIN) IVPB 1000 mg/200 mL premix  Status:  Discontinued        1,000 mg 200 mL/hr over 60 Minutes Intravenous Every M-W-F (Hemodialysis) 10/01/20 1056 10/04/20 0845   09/29/20 1200  vancomycin (VANCOCIN) IVPB 1000 mg/200 mL premix  Status:  Discontinued        1,000 mg 200 mL/hr over 60 Minutes Intravenous Every M-W-F (Hemodialysis) 09/27/20 1443 10/01/20 0922   09/29/20 1200  ceFEPIme (MAXIPIME) 2 g in sodium chloride 0.9 % 100 mL IVPB  Status:  Discontinued        2 g 200 mL/hr over 30 Minutes Intravenous Every M-W-F (Hemodialysis) 09/27/20 1443 09/28/20 1054   09/28/20 0900  metroNIDAZOLE (FLAGYL) IVPB 500 mg  Status:  Discontinued        500 mg 100 mL/hr over 60 Minutes Intravenous Every 12 hours 09/27/20 1808 09/28/20 1054   09/28/20 0900  ceFEPIme (MAXIPIME) 1 g in sodium chloride 0.9 % 100 mL IVPB  Status:  Discontinued        1 g 200 mL/hr over 30 Minutes Intravenous Every 12 hours 09/27/20 1808 09/27/20 1812   09/27/20 1445  vancomycin (VANCOREADY) IVPB 2000 mg/400 mL        2,000 mg 200 mL/hr over 120 Minutes Intravenous  Once 09/27/20 1433 09/27/20 2330   09/27/20 1430  ceFEPIme (MAXIPIME) 2 g in sodium chloride 0.9 % 100 mL IVPB        2 g 200 mL/hr over 30 Minutes Intravenous  Once 09/27/20 1418 09/27/20 1541   09/27/20 1430  metroNIDAZOLE (FLAGYL) IVPB 500 mg        500 mg 100 mL/hr over  60 Minutes Intravenous  Once 09/27/20 1418 09/27/20 2214   09/27/20 1430  vancomycin (VANCOREADY) IVPB 1750 mg/350 mL  Status:  Discontinued        1,750 mg 175 mL/hr over 120 Minutes Intravenous  Once 09/27/20 1418 09/27/20 1433       Subjective: Laying in bed watching television.  States overall he is feeling better.  States has some intermittent shortness of breath.  States some intermittent chest pain which is improving since admission.  No abdominal pain.   Laying in bed.  Denies any significant chest pain.  States has some intermittent shortness of breath however nothing new.  No abdominal pain.  Occasional right lower extremity pain when he moves around per patient.  Objective: Vitals:   10/21/20 0134 10/21/20 0207 10/21/20 0438 10/21/20 0825  BP: (!) 210/67 125/90 (!) 133/96 (!) 136/97  Pulse: 90 93 96 88  Resp: '20 20 20 16  '$ Temp: 98.6 F (37 C) 98.2 F (36.8 C) 97.7 F (36.5 C) 97.6 F (36.4 C)  TempSrc: Oral Oral Oral Oral  SpO2: 96% 99% 93% 100%  Weight: 75.8 kg  75.8 kg   Height:        Intake/Output Summary (Last 24 hours) at 10/21/2020 1043 Last data filed at 10/21/2020 0112 Gross per 24 hour  Intake 200 ml  Output 3000 ml  Net -2800 ml  Filed Weights   10/20/20 2137 10/21/20 0134 10/21/20 0438  Weight: 78.8 kg 75.8 kg 75.8 kg    Examination:  General exam: NAD Respiratory system: Minimal bibasilar crackles.  No wheezing.  No rhonchi.  Fair air movement.  Cardiovascular system: S1 & S2 heard, RRR. No JVD, murmurs, rubs, gallops or clicks. No pedal edema. Gastrointestinal system: Abdomen is nondistended, soft and nontender. No organomegaly or masses felt. Normal bowel sounds heard. Central nervous system: Alert and oriented. No focal neurological deficits. Extremities: Right lower extremity with abscesses noted on the tib-fib which are tender to palpation.   Skin: No rashes, lesions or ulcers Psychiatry: Judgement and insight appear normal. Mood & affect  appropriate.     Data Reviewed: I have personally reviewed following labs and imaging studies  CBC: Recent Labs  Lab 10/16/20 0324 10/18/20 0833 10/20/20 0148 10/20/20 1937 10/21/20 0520  WBC 15.5* 13.3* 11.3* 14.1* 10.1  NEUTROABS  --   --  7.6  --  8.1*  HGB 9.6* 9.1* 9.2* 9.1* 9.7*  HCT 29.4* 29.4* 28.6* 29.2* 29.9*  MCV 88.0 91.3 89.9 89.8 88.2  PLT 160 211 243 296 92*    Basic Metabolic Panel: Recent Labs  Lab 10/14/20 2022 10/14/20 2354 10/15/20 0406 10/15/20 0622 10/15/20 1641 10/16/20 0324 10/18/20 0148 10/20/20 0148 10/20/20 1937 10/21/20 0520  NA 135   < > 134*   < > 134* 134* 127* 130* 126* 132*  K 5.7*   < > 5.0   < > 3.7 4.1 4.3 3.9 5.0 3.9  CL 104  --  101  --  98 97* 93* 93* 90* 94*  CO2 19*  --  18*  --  '24 26 25 24 23 23  '$ GLUCOSE 95  --  122*  --  125* 99 80 92 121* 103*  BUN 26*  --  30*  --  12 17 28* 37* 45* 22*  CREATININE 5.45*  --  6.07*  --  3.34* 4.46* 5.37* 6.67* 7.98* 4.69*  CALCIUM 7.7*  --  7.9*  --  8.4* 8.8* 8.1* 8.6* 8.3* 8.3*  MG 2.6*  --  2.6*  --  2.0  --   --   --   --   --   PHOS  --   --   --   --   --   --   --  5.1* 5.5* 4.3   < > = values in this interval not displayed.    GFR: Estimated Creatinine Clearance: 20.4 mL/min (A) (by C-G formula based on SCr of 4.69 mg/dL (H)).  Liver Function Tests: Recent Labs  Lab 10/20/20 0148 10/20/20 1937 10/21/20 0520  ALBUMIN 2.1* 2.2* 2.1*    CBG: Recent Labs  Lab 10/16/20 1529 10/16/20 2134 10/17/20 0625 10/17/20 0807 10/17/20 1158  GLUCAP 105* 111* 82 76 84     Recent Results (from the past 240 hour(s))  Surgical pcr screen     Status: Abnormal   Collection Time: 10/11/20  6:44 PM   Specimen: Nasal Mucosa; Nasal Swab  Result Value Ref Range Status   MRSA, PCR POSITIVE (A) NEGATIVE Final    Comment: RESULT CALLED TO, READ BACK BY AND VERIFIED WITH: F COMBRES RN 10/11/20 2339 JDW    Staphylococcus aureus POSITIVE (A) NEGATIVE Final    Comment: (NOTE) The  Xpert SA Assay (FDA approved for NASAL specimens in patients 18 years of age and older), is one component of a comprehensive surveillance program. It is not  intended to diagnose infection nor to guide or monitor treatment. Performed at [facility], [place] [address] [place], [place] [address]   Culture, blood (routine x 2)     Status: None   Collection Time: 10/13/20 10:43 PM   Specimen: BLOOD  Result Value Ref Range Status   Specimen Description BLOOD RIGHT THUMB  Final   Special Requests   Final    BOTTLES DRAWN AEROBIC AND ANAEROBIC Blood Culture adequate volume   Culture   Final    NO GROWTH 5 DAYS Performed at [facility], [place] [address], [place], [place] [address]    Report Status 10/19/2020 FINAL  Final  Culture, blood (routine x 2)     Status: None   Collection Time: 10/13/20 10:43 PM   Specimen: BLOOD RIGHT HAND  Result Value Ref Range Status   Specimen Description BLOOD RIGHT HAND  Final   Special Requests   Final    BOTTLES DRAWN AEROBIC AND ANAEROBIC Blood Culture adequate volume   Culture   Final    NO GROWTH 5 DAYS Performed at [facility], [place] [address] [place], [place] [address]    Report Status 10/19/2020 FINAL  Final  Aerobic/Anaerobic Culture w Gram Stain (surgical/deep wound)     Status: None   Collection Time: 10/14/20 10:38 AM   Specimen: Soft Tissue, Other  Result Value Ref Range Status   Specimen Description TISSUE MITRAL VALVE VEGETATION SPEC A  Final   Special Requests MITRAL VALVE VEGETATION  Final   Gram Stain   Final    WBC PRESENT,BOTH PMN AND MONONUCLEAR NO ORGANISMS SEEN    Culture   Final    No growth aerobically or anaerobically. Performed at [facility], [place] [address], [place],  [address]    Report Status 10/19/2020 FINAL  Final         Radiology Studies: DG Tibia/Fibula Right  Result Date: 10/19/2020 CLINICAL DATA:  Right anterior lower leg swelling 1.5 weeks. No history of injury. EXAM:  RIGHT TIBIA AND FIBULA - 2 VIEW COMPARISON:  05/27/2019 FINDINGS: Negative for fracture. Extensive ossification in the soft tissues between the tibia and fibula anteriorly. This is unchanged from the prior study and likely is dystrophic calcification from prior injury. Possible muscle ossification. There is nodular soft tissue swelling of the soft tissues anterior to the proximal tibia question injury or infection. This was not present previously Arterial calcification IMPRESSION: Chronic soft tissue ossification medial to the tibia anteriorly unchanged from the prior study. Likely muscle ossification. New areas of nodular soft tissue swelling anterior to the proximal tibia. Question injury or infection. No acute fracture. Electronically Signed   By: Franchot Gallo M.D.   On: 10/19/2020 15:52   CT TIBIA FIBULA RIGHT W CONTRAST  Addendum Date: 10/20/2020   ADDENDUM REPORT: 10/20/2020 10:32 ADDENDUM: These results were discussed by telephone on 10/20/2020 at 10:32 am to provider MICHAEL JEFFERY , who verbally acknowledged these results. Electronically Signed   By: Davina Poke D.O.   On: 10/20/2020 10:32   Result Date: 10/20/2020 CLINICAL DATA:  Lower leg soft tissue infection. Focal swelling. Concern for abscess. EXAM: CT OF THE LOWER RIGHT EXTREMITY WITH CONTRAST TECHNIQUE: Multidetector CT imaging of the lower right extremity was performed according to the standard protocol following intravenous contrast administration. CONTRAST:  116m OMNIPAQUE IOHEXOL 300 MG/ML  SOLN COMPARISON:  X-ray 10/19/2020, 05/27/2019 FINDINGS: Bones/Joint/Cartilage No acute fracture. No dislocation. No bony erosion or periosteal  elevation. No knee or ankle joint effusion. No significant arthropathy of the knee or ankle joints. Ligaments Suboptimally assessed by CT. Muscles and Tendons Extensive intramuscular calcification throughout the tibialis anterior and extensor digitorum longus muscles with calcification extending  approximately 25 cm in length. No intramuscular fluid collection. Tendinous structures appear intact. Soft tissues Solid appearing circumscribed mass anterior to the midportion of the tibialis anterior muscle measures 3.5 x 1.7 x 3.3 cm (series 4, image 86; series 10, image 13). Hyperdensity within the central and lateral aspects of this mass may reflect enhancement. There is calcification along the posterior and lateral margins. Peripherally enhancing collection the anterior aspect of the lower leg which is draped over the anterior aspect of the proximal tibial diaphysis measuring 4.3 x 1.6 x 3.2 cm (series 4, image 69) series 10, image 33). There also appears to be some enhancement or internal mineralization within the lateral aspect of this collection or mass. Mild subcutaneous edema anteriorly. No soft tissue gas. No deep fascial fluid collections. Extensive arterial atherosclerosis. IMPRESSION: 1. Solid-appearing circumscribed mass anterior to the midportion of the tibialis anterior muscle measures up to 3.5 cm. There is calcification along the posterior and lateral margins. There also appears to be some enhancement or internal mineralization within the lateral aspect of this mass or collection. Findings raise the possibility for a soft tissue neoplasm such as sarcoma. Other entities include but are not limited to amyloid deposition or atypical infection. Further evaluation with ultrasound or contrast-enhanced MRI would be helpful to distinguish cystic versus solid process. 2. Peripherally enhancing collection or mass overlying anterior aspect of the proximal tibial diaphysis measuring up to 4.3 cm. There also appears to be some enhancement or internal mineralization within the lateral aspect of this collection or mass. Abscess is not excluded, although the internal mineralization would be atypical for soft tissue infection. 3. Extensive intramuscular calcification throughout the tibialis anterior and extensor  digitorum longus muscles with calcification extending approximately 25 cm in length. Findings are suggestive of sequela of prior myositis or myonecrosis. 4. No acute osseous abnormality.  No evidence of osteomyelitis. Ordering provider has been paged. Documentation of the communication of the above findings will be added to the report in the form of an addendum. Electronically Signed: By: Davina Poke D.O. On: 10/20/2020 10:24        Scheduled Meds: . aspirin EC  325 mg Oral Daily  . calcium acetate  667 mg Oral TID WC  . Chlorhexidine Gluconate Cloth  6 each Topical Q0600  . Chlorhexidine Gluconate Cloth  6 each Topical Q0600  . cinacalcet  30 mg Oral Q breakfast  . cloNIDine  0.3 mg Transdermal Weekly  . [START ON 10/29/2020] darbepoetin (ARANESP) injection - DIALYSIS  60 mcg Intravenous Q Fri-HD  . docusate sodium  200 mg Oral Daily  . doxercalciferol  3 mcg Intravenous Q M,W,F-HD  . enoxaparin (LOVENOX) injection  30 mg Subcutaneous Q48H  . feeding supplement (NEPRO CARB STEADY)  237 mL Oral BID BM  . gabapentin  200 mg Oral QHS  . influenza vac split quadrivalent PF  0.5 mL Intramuscular Tomorrow-1000  . metoprolol tartrate  50 mg Oral BID  . multivitamin  1 tablet Oral QHS  . pantoprazole  40 mg Oral Daily  . sodium chloride flush  3 mL Intravenous Q12H   Continuous Infusions: . sodium chloride    . sodium chloride    . sodium chloride    . sodium chloride    .  vancomycin       LOS: 24 days    Time spent: 40 minutes    Irine Seal, MD [facility]   To contact the attending provider between 7A-7P or the covering provider during after hours 7P-7A, please log into the web site [email or web address] and access using universal [place] password for that web site. If you do not have the password, please call the hospital operator.  10/21/2020, 10:43 AM

## 2020-10-21 NOTE — Progress Notes (Signed)
Mobility Specialist: Progress Note   10/21/20 1703  Mobility  Activity Refused mobility   Pt refused mobility stating he just finished working with PT not too long ago. Will f/u tomorrow.   [facility] Kynan Peasley Mobility Specialist Mobility Specialist Phone: [phone]

## 2020-10-21 NOTE — Progress Notes (Signed)
Please clarify ambulation status after osteo I&D. Thank you.  [name] CES, ACSM 11:24 AM 10/21/2020

## 2020-10-21 NOTE — Progress Notes (Signed)
Nutrition Follow-up  DOCUMENTATION CODES:  Not applicable  INTERVENTION:  Continue Nepro Shake po TID, each supplement provides 425 kcal and 19 grams protein.  Add 30 ml ProSource Plus po BID, each supplement provides 100 kcal and 15 grams of protein.   Continue Rena-Vite daily.  NUTRITION DIAGNOSIS:  Increased nutrient needs related to chronic illness (ESRD on HD) as evidenced by estimated needs.  GOAL:  Patient will meet greater than or equal to 90% of their needs  MONITOR:  PO intake,Supplement acceptance,Skin,I & O's,Diet advancement,Labs,Weight trends  ASSESSMENT:  68 YOM admitted for pneumonia d/t infectious agent. PMH of cirrhosis of liver, drug abuse, ETOH abuse, anemia, CHF, Hepatitis C, HTN, ESRD on HD. Recent onset of sepsis d/t MRSA, acute CVA. 3/10 - minimally invasive mitral valve replacement per CT surgery  3/16 - debridement of a large purulent abscess anterior right tibia and placement of wound VAC  Likely to return to surgery on Friday, 3/18  Spoke with pt at bedside. Pt in good spirits. He reports eating better now, his appetite is improving. He also reports some of the food tasting better than other dishes. He also reports loving the Nepro, and can drink up to 3 per day.  He reports some weight loss while in the hospital. Not much change since previous NFPE, however some mild fat depletion in the cheeks.  Encouraged continued PO and supplement intake. Pt open to trying ProSource Plus with lunch and dinner meals and drinking another Nepro daily.  Relevant Medications: PhosLo, colace, Rena-Vite, Protonix, oxycodone Labs: reviewed; Na 132, Glucose 103, corrected Ca 9.8  NUTRITION FOCUSED PHYSICAL EXAM: Flowsheet Row Most Recent Value  Orbital Region No depletion  Upper Arm Region No depletion  Thoracic and Lumbar Region No depletion  Buccal Region Mild depletion  Temple Region No depletion  Clavicle Bone Region No depletion  Clavicle and Acromion Bone  Region No depletion  Scapular Bone Region No depletion  Dorsal Hand Mild depletion  Patellar Region Moderate depletion  Anterior Thigh Region Moderate depletion  Posterior Calf Region Moderate depletion  Edema (RD Assessment) Mild  Hair Reviewed  Eyes Reviewed  [pale conjunctiva]  Mouth Reviewed  Skin Reviewed  Nails Reviewed     Diet Order:   Diet Order            Diet Carb Modified Fluid consistency: Thin; Room service appropriate? Yes  Diet effective now                EDUCATION NEEDS:  Education needs have been addressed (RD discussed with pt protein's importance and protein sources)  Skin:  Skin Assessment: Skin Integrity Issues: Skin Integrity Issues:: Wound VAC Wound Vac: On R calf Incisions: R chest, lip, R groin  Last BM:  10/19/20 - Type 3  Height:  Ht Readings from Last 1 Encounters:  10/20/20 6' 0.01" (1.829 m)   Weight:  Wt Readings from Last 1 Encounters:  10/21/20 75.8 kg   Ideal Body Weight:  81 kg  BMI:  Body mass index is 22.66 kg/m.  Estimated Nutritional Needs:  Kcal:  2200-2400 Protein:  105-120 g Fluid:  1.2 L  Derrel Nip, RD, LDN Registered Dietitian After Hours/Weekend Pager # in [place]

## 2020-10-21 NOTE — Progress Notes (Signed)
Patient ID: Patrick Anthony, male   DOB: 1971/02/21, 50 y.o.   MRN: [id number] Patient is postoperative day 1 debridement of a large purulent abscess anterior right tibia.  The heterotopic ossification extending the entire length of the tibia appeared to be necrotic bone consistent with chronic osteomyelitis.  Tissue and abscess were sent for cultures the necrotic bone was sent to pathology for identification.  Discussed with the patient if this is truly chronic osteomyelitis we would need to proceed with an above-the-knee amputation on the right.  Discussed that this would be necessary to protect his new mitral valve.  Plan for further surgical intervention of the right leg once we have pathology and culture results.

## 2020-10-21 NOTE — Progress Notes (Signed)
Physical Therapy Treatment Patient Details Name: Patrick Anthony MRN: [id number] DOB: [birth year] Today's Date: 10/21/2020    History of Present Illness Pt is a 50 y.o. male admitted 09/27/20 with fever, chills, chest pain, SOB, generalized weakness. Pt found to have MRSA bacteremia. 2/22 MRI showed multiple scattered small infarcts (bilateral cerebral hemispheres, R basal ganglia, bileteral cerebellum); suspect embolic process. Echo showed mitral valve vegetation, mild-moderate mitral regurgitation. No significant CAD on L heart cath. 3/8 dental extraction. 3/10 mini MVR with closure of PFO. 3/15 orthopedics consulted due to RLE possible abscess. PMH includes CKD (HD MWF), HTN, CHF, Hep C, polysubstance abuse.    PT Comments    Pt received seated EOB, agreeable to therapy session and with good participation for gait training. Pt making progress toward mobility goals but remains impulsive at times, no LOB noted using RW for mobility but needs safety cues occasionally and had chair follow for safety as he reports RLE is more painful post-I&D. Pt continues to benefit from PT services to progress toward functional mobility goals. Continue to recommend HHPT, will continue to assess pending medical course as chart review indicates he may need AKA.   Follow Up Recommendations  Home health PT     Equipment Recommendations  Rolling walker with 5" wheels;3in1 (PT)    Recommendations for Other Services       Precautions / Restrictions Precautions Precautions: Fall Restrictions Weight Bearing Restrictions: Yes RLE Weight Bearing: Weight bearing as tolerated    Mobility  Bed Mobility Overal bed mobility: Modified Independent             General bed mobility comments: received EOB    Transfers Overall transfer level: Needs assistance   Transfers: Sit to/from Stand Sit to Stand: Supervision         General transfer comment: from EOB, toilet and chair to  RW  Ambulation/Gait Ambulation/Gait assistance: Supervision;+2 safety/equipment;Min guard Gait Distance (Feet): 300 Feet Assistive device: Rolling walker (2 wheeled) Gait Pattern/deviations: Step-through pattern;Decreased stride length;Antalgic;Decreased weight shift to right Gait velocity: reduced   General Gait Details: RLE more antalgic s/p I&D; good use of RW, no LOB denies dizziness but more pain limited this date so chair follow for safety but no buckling observed; VSS on RA, HR max 98 bpm during mobility   Stairs             Wheelchair Mobility    Modified Rankin (Stroke Patients Only)       Balance Overall balance assessment: Needs assistance Sitting-balance support: No upper extremity supported;Feet supported Sitting balance-Leahy Scale: Good     Standing balance support: No upper extremity supported;During functional activity;Bilateral upper extremity supported Standing balance-Leahy Scale: Fair Standing balance comment: no overt LOB able to reach outside BOS with U UE support no LOB, static standing no UE support no LOB; RW for gait due to RLE pain                            Cognition Arousal/Alertness: Awake/alert Behavior During Therapy: Impulsive;Restless Overall Cognitive Status: No family/caregiver present to determine baseline cognitive functioning Area of Impairment: Attention;Following commands;Safety/judgement;Problem solving                   Current Attention Level: Divided Memory: Decreased recall of precautions;Decreased short-term memory Following Commands: Follows one step commands inconsistently (ignores commands at times) Safety/Judgement: Decreased awareness of safety;Decreased awareness of deficits   Problem Solving: Requires verbal cues General Comments: Appropriate  during session; answering questions; unsure of insight into deficits; Pt following most 1-step commands but impulsivity noted so +2 chair follow for  safety      Exercises Other Exercises Other Exercises: reviewed importance of completing incentive spirometer, pt pulling 1000-1250 x5 reps    General Comments        Pertinent Vitals/Pain Pain Assessment: 0-10 Pain Location: RLE (localized swelling over mid-tibia) Pain Descriptors / Indicators: Grimacing;Tender;Sore Pain Intervention(s): Monitored during session;Repositioned;Patient requesting pain meds-RN notified (offered ice but pt defers)    Home Living                      Prior Function            PT Goals (current goals can now be found in the care plan section) Acute Rehab PT Goals Patient Stated Goal: to return home and be independent PT Goal Formulation: With patient Time For Goal Achievement: 10/31/20 Potential to Achieve Goals: Fair Progress towards PT goals: Progressing toward goals    Frequency    Min 3X/week      PT Plan Current plan remains appropriate    Co-evaluation              AM-PAC PT "6 Clicks" Mobility   Outcome Measure  Help needed turning from your back to your side while in a flat bed without using bedrails?: None Help needed moving from lying on your back to sitting on the side of a flat bed without using bedrails?: None Help needed moving to and from a bed to a chair (including a wheelchair)?: A Little Help needed standing up from a chair using your arms (e.g., wheelchair or bedside chair)?: A Little Help needed to walk in hospital room?: A Little Help needed climbing 3-5 steps with a railing? : A Little 6 Click Score: 20    End of Session Equipment Utilized During Treatment: Gait belt (chair follow) Activity Tolerance: Patient tolerated treatment well Patient left: with call bell/phone within reach;in chair (OK to leave chair alarm off per RN, pt tray table in front and able to demo use of call bell, pt reports he will call prior to getting up; pt deferring ice pack for leg) Nurse Communication: Mobility  status;Patient requests pain meds PT Visit Diagnosis: Difficulty in walking, not elsewhere classified (R26.2);Other abnormalities of gait and mobility (R26.89)     Time: 1541-1610 PT Time Calculation (min) (ACUTE ONLY): 29 min  Charges:  $Gait Training: 8-22 mins $Therapeutic Activity: 8-22 mins                     Pecolia Marando P., PTA Acute Rehabilitation Services Pager: [phone] Office: [place] 10/21/2020, 4:48 PM

## 2020-10-21 NOTE — Progress Notes (Addendum)
[facility] ROUNDING NOTE   Subjective:   Brief history: Status post minimally invasive mitral valve replacement 10/14/2020.  End-stage renal disease Monday Wednesday Friday dialysis.  Underwent successful dialysis 10/21/2020 with 3 L removed.  His next dialysis will be Friday, 10/22/2020  Abscess noted over right anterior tibia.  Appreciate assistance from orthopedic surgery.  Status post I&D  Blood pressure 133/96 pulse 96 temperature 97.7 O2 sats 93%  Sodium 132 potassium 3.9 chloride 94 CO2 23 BUN 22 creatinine 4.69 glucose 103 calcium 8.3 phosphorus 4.3 albumin 2.1 hemoglobin 9.1  Objective:  Vital signs in last 24 hours:  Temp:  [97.1 F (36.2 C)-98.6 F (37 C)] 97.7 F (36.5 C) (03/17 0438) Pulse Rate:  [80-99] 96 (03/17 0438) Resp:  [15-20] 20 (03/17 0438) BP: (100-221)/(59-100) 133/96 (03/17 0438) SpO2:  [93 %-100 %] 93 % (03/17 0438) Weight:  [75.8 kg-78.8 kg] 75.8 kg (03/17 0438)  Weight change: -0 kg Filed Weights   10/20/20 2137 10/21/20 0134 10/21/20 0438  Weight: 78.8 kg 75.8 kg 75.8 kg    Intake/Output: I/O last 3 completed shifts: In: 700 [P.O.:500; I.V.:100; IV Piggyback:100] Out: 3000 [Other:3000]   Intake/Output this shift:  No intake/output data recorded.  Gen: alert, calm today Heart:S1,S2 now with mitral click  Lungs: clear bilat, no wheezing Abdomen:OG in place. No BS.  Ext: Fluctuant lesion noted over anterior tibia right. Dialysis Access:L AVG + Bruit   Basic Metabolic Panel: Recent Labs  Lab 10/14/20 2022 10/14/20 2354 10/15/20 0406 10/15/20 0622 10/15/20 1641 10/16/20 0324 10/18/20 0148 10/20/20 0148 10/20/20 1937 10/21/20 0520  NA 135   < > 134*   < > 134* 134* 127* 130* 126* 132*  K 5.7*   < > 5.0   < > 3.7 4.1 4.3 3.9 5.0 3.9  CL 104  --  101  --  98 97* 93* 93* 90* 94*  CO2 19*  --  18*  --  '24 26 25 24 23 23  '$ GLUCOSE 95  --  122*  --  125* 99 80 92 121* 103*  BUN 26*  --  30*  --  12 17 28* 37* 45* 22*   CREATININE 5.45*  --  6.07*  --  3.34* 4.46* 5.37* 6.67* 7.98* 4.69*  CALCIUM 7.7*  --  7.9*  --  8.4* 8.8* 8.1* 8.6* 8.3* 8.3*  MG 2.6*  --  2.6*  --  2.0  --   --   --   --   --   PHOS  --   --   --   --   --   --   --  5.1* 5.5* 4.3   < > = values in this interval not displayed.    Liver Function Tests: Recent Labs  Lab 10/20/20 0148 10/20/20 1937 10/21/20 0520  ALBUMIN 2.1* 2.2* 2.1*   No results for input(s): LIPASE, AMYLASE in the last 168 hours. No results for input(s): AMMONIA in the last 168 hours.  CBC: Recent Labs  Lab 10/15/20 1641 10/16/20 0324 10/18/20 0833 10/20/20 0148 10/20/20 1937  WBC 16.0* 15.5* 13.3* 11.3* 14.1*  NEUTROABS  --   --   --  7.6  --   HGB 9.9* 9.6* 9.1* 9.2* 9.1*  HCT 31.2* 29.4* 29.4* 28.6* 29.2*  MCV 87.4 88.0 91.3 89.9 89.8  PLT 167 160 211 243 296    Cardiac Enzymes: No results for input(s): CKTOTAL, CKMB, CKMBINDEX, TROPONINI in the last 168 hours.  BNP: Invalid input(s):  POCBNP  CBG: Recent Labs  Lab 10/16/20 1529 10/16/20 2134 10/17/20 0625 10/17/20 0807 10/17/20 1158  GLUCAP 105* 111* 82 76 84    Microbiology: Results for orders placed or performed during the hospital encounter of 09/27/20  Blood Culture (routine x 2)     Status: Abnormal   Collection Time: 09/27/20  2:16 PM   Specimen: BLOOD RIGHT HAND  Result Value Ref Range Status   Specimen Description BLOOD RIGHT HAND  Final   Special Requests   Final    BOTTLES DRAWN AEROBIC AND ANAEROBIC Blood Culture results may not be optimal due to an inadequate volume of blood received in culture bottles Performed at [facility], [place] [address], [place], [name] [address]    Culture  Setup Time   Final    GRAM POSITIVE COCCI IN CLUSTERS IN BOTH AEROBIC AND ANAEROBIC BOTTLES    Culture METHICILLIN RESISTANT STAPHYLOCOCCUS AUREUS (A)  Final   Report Status 09/30/2020 FINAL  Final   Organism ID, Bacteria METHICILLIN RESISTANT STAPHYLOCOCCUS AUREUS  Final       Susceptibility   Methicillin resistant staphylococcus aureus - MIC*    CIPROFLOXACIN >=8 RESISTANT Resistant     ERYTHROMYCIN >=8 RESISTANT Resistant     GENTAMICIN <=0.5 SENSITIVE Sensitive     OXACILLIN >=4 RESISTANT Resistant     TETRACYCLINE <=1 SENSITIVE Sensitive     VANCOMYCIN <=0.5 SENSITIVE Sensitive     TRIMETH/SULFA <=10 SENSITIVE Sensitive     CLINDAMYCIN <=0.25 SENSITIVE Sensitive     RIFAMPIN <=0.5 SENSITIVE Sensitive     Inducible Clindamycin NEGATIVE Sensitive     * METHICILLIN RESISTANT STAPHYLOCOCCUS AUREUS  Resp Panel by RT-PCR (Flu A&B, Covid) Nasopharyngeal Swab     Status: None   Collection Time: 09/27/20  2:16 PM   Specimen: Nasopharyngeal Swab; Nasopharyngeal(NP) swabs in vial transport medium  Result Value Ref Range Status   SARS Coronavirus 2 by RT PCR NEGATIVE NEGATIVE Final    Comment: (NOTE) SARS-CoV-2 target nucleic acids are NOT DETECTED.  The SARS-CoV-2 RNA is generally detectable in upper respiratory specimens during the acute phase of infection. The lowest concentration of SARS-CoV-2 viral copies this assay can detect is 138 copies/mL. A negative result does not preclude SARS-Cov-2 infection and should not be used as the sole basis for treatment or other patient management decisions. A negative result may occur with  improper specimen collection/handling, submission of specimen other than nasopharyngeal swab, presence of viral mutation(s) within the areas targeted by this assay, and inadequate number of viral copies(<138 copies/mL). A negative result must be combined with clinical observations, patient history, and epidemiological information. The expected result is Negative.  Fact Sheet for Patients:  EntrepreneurPulse.com.au  Fact Sheet for Healthcare Providers:  IncredibleEmployment.be  This test is no t yet approved or cleared by the [place] FDA and  has been authorized for detection and/or  diagnosis of SARS-CoV-2 by FDA under an Emergency Use Authorization (EUA). This EUA will remain  in effect (meaning this test can be used) for the duration of the COVID-19 declaration under Section 564(b)(1) of the Act, 21 U.S.C.section 360bbb-3(b)(1), unless the authorization is terminated  or revoked sooner.       Influenza A by PCR NEGATIVE NEGATIVE Final   Influenza B by PCR NEGATIVE NEGATIVE Final    Comment: (NOTE) The Xpert Xpress SARS-CoV-2/FLU/RSV plus assay is intended as an aid in the diagnosis of influenza from Nasopharyngeal swab specimens and should not be used as a sole  basis for treatment. Nasal washings and aspirates are unacceptable for Xpert Xpress SARS-CoV-2/FLU/RSV testing.  Fact Sheet for Patients: EntrepreneurPulse.com.au  Fact Sheet for Healthcare Providers: IncredibleEmployment.be  This test is not yet approved or cleared by the [place] FDA and has been authorized for detection and/or diagnosis of SARS-CoV-2 by FDA under an Emergency Use Authorization (EUA). This EUA will remain in effect (meaning this test can be used) for the duration of the COVID-19 declaration under Section 564(b)(1) of the Act, 21 U.S.C. section 360bbb-3(b)(1), unless the authorization is terminated or revoked.  Performed at [facility], [place] [address], [place], [place] [address]   Blood Culture ID Panel (Reflexed)     Status: Abnormal   Collection Time: 09/27/20  2:16 PM  Result Value Ref Range Status   Enterococcus faecalis NOT DETECTED NOT DETECTED Final   Enterococcus Faecium NOT DETECTED NOT DETECTED Final   Listeria monocytogenes NOT DETECTED NOT DETECTED Final   Staphylococcus species DETECTED (A) NOT DETECTED Final    Comment: CRITICAL RESULT CALLED TO, READ BACK BY AND VERIFIED WITH: C,PIERCE PHARMD '@1009'$  09/28/20 EB    Staphylococcus aureus (BCID) DETECTED (A) NOT DETECTED Final    Comment: Methicillin  (oxacillin)-resistant Staphylococcus aureus (MRSA). MRSA is predictably resistant to beta-lactam antibiotics (except ceftaroline). Preferred therapy is vancomycin unless clinically contraindicated. Patient requires contact precautions if  hospitalized. CRITICAL RESULT CALLED TO, READ BACK BY AND VERIFIED WITH: C,PIERCE PHARMD '@1009'$  09/28/20 EB    Staphylococcus epidermidis NOT DETECTED NOT DETECTED Final   Staphylococcus lugdunensis NOT DETECTED NOT DETECTED Final   Streptococcus species NOT DETECTED NOT DETECTED Final   Streptococcus agalactiae NOT DETECTED NOT DETECTED Final   Streptococcus pneumoniae NOT DETECTED NOT DETECTED Final   Streptococcus pyogenes NOT DETECTED NOT DETECTED Final   A.calcoaceticus-baumannii NOT DETECTED NOT DETECTED Final   Bacteroides fragilis NOT DETECTED NOT DETECTED Final   Enterobacterales NOT DETECTED NOT DETECTED Final   Enterobacter cloacae complex NOT DETECTED NOT DETECTED Final   Escherichia coli NOT DETECTED NOT DETECTED Final   Klebsiella aerogenes NOT DETECTED NOT DETECTED Final   Klebsiella oxytoca NOT DETECTED NOT DETECTED Final   Klebsiella pneumoniae NOT DETECTED NOT DETECTED Final   Proteus species NOT DETECTED NOT DETECTED Final   Salmonella species NOT DETECTED NOT DETECTED Final   Serratia marcescens NOT DETECTED NOT DETECTED Final   Haemophilus influenzae NOT DETECTED NOT DETECTED Final   Neisseria meningitidis NOT DETECTED NOT DETECTED Final   Pseudomonas aeruginosa NOT DETECTED NOT DETECTED Final   Stenotrophomonas maltophilia NOT DETECTED NOT DETECTED Final   Candida albicans NOT DETECTED NOT DETECTED Final   Candida auris NOT DETECTED NOT DETECTED Final   Candida glabrata NOT DETECTED NOT DETECTED Final   Candida krusei NOT DETECTED NOT DETECTED Final   Candida parapsilosis NOT DETECTED NOT DETECTED Final   Candida tropicalis NOT DETECTED NOT DETECTED Final   Cryptococcus neoformans/gattii NOT DETECTED NOT DETECTED Final   Meth  resistant mecA/C and MREJ DETECTED (A) NOT DETECTED Final    Comment: CRITICAL RESULT CALLED TO, READ BACK BY AND VERIFIED WITH: C,PIERCE PHARMD '@1009'$  09/28/20 EB Performed at [facility], [address], [place], [place] [address]   Blood Culture (routine x 2)     Status: Abnormal   Collection Time: 09/27/20  2:29 PM   Specimen: BLOOD  Result Value Ref Range Status   Specimen Description BLOOD SITE NOT SPECIFIED  Final   Special Requests   Final    BOTTLES DRAWN  AEROBIC AND ANAEROBIC Blood Culture results may not be optimal due to an inadequate volume of blood received in culture bottles   Culture  Setup Time   Final    GRAM POSITIVE COCCI IN CLUSTERS ANAEROBIC BOTTLE ONLY CRITICAL VALUE NOTED.  VALUE IS CONSISTENT WITH PREVIOUSLY REPORTED AND CALLED VALUE.    Culture (A)  Final    STAPHYLOCOCCUS AUREUS SUSCEPTIBILITIES PERFORMED ON PREVIOUS CULTURE WITHIN THE LAST 5 DAYS. Performed at [facility], [place] [address], [place], [place] [address]    Report Status 10/01/2020 FINAL  Final  Culture, blood (routine x 2)     Status: None   Collection Time: 09/30/20  6:04 AM   Specimen: BLOOD RIGHT FOREARM  Result Value Ref Range Status   Specimen Description BLOOD RIGHT FOREARM  Final   Special Requests   Final    BOTTLES DRAWN AEROBIC AND ANAEROBIC Blood Culture adequate volume   Culture   Final    NO GROWTH 5 DAYS Performed at [facility], [place] [address], [place], [place] [address]    Report Status 10/05/2020 FINAL  Final  Culture, blood (routine x 2)     Status: Abnormal   Collection Time: 09/30/20  6:08 AM   Specimen: BLOOD RIGHT HAND  Result Value Ref Range Status   Specimen Description BLOOD RIGHT HAND  Final   Special Requests   Final    BOTTLES DRAWN AEROBIC AND ANAEROBIC Blood Culture results may not be optimal due to an inadequate volume of blood received in culture bottles   Culture  Setup Time   Final    GRAM POSITIVE COCCI IN CLUSTERS ANAEROBIC  BOTTLE ONLY CRITICAL VALUE NOTED.  VALUE IS CONSISTENT WITH PREVIOUSLY REPORTED AND CALLED VALUE.    Culture (A)  Final    STAPHYLOCOCCUS AUREUS SUSCEPTIBILITIES PERFORMED ON PREVIOUS CULTURE WITHIN THE LAST 5 DAYS. Performed at [facility], [place] [address] [place], [place] [address]    Report Status 10/03/2020 FINAL  Final  Culture, blood (routine x 2)     Status: None   Collection Time: 10/03/20  2:42 AM   Specimen: BLOOD  Result Value Ref Range Status   Specimen Description BLOOD LEFT ARM  Final   Special Requests   Final    BOTTLES DRAWN AEROBIC AND ANAEROBIC Blood Culture results may not be optimal due to an inadequate volume of blood received in culture bottles   Culture   Final    NO GROWTH 5 DAYS Performed at [facility], [place] [address], [place], [place] [address]    Report Status 10/08/2020 FINAL  Final  Culture, blood (routine x 2)     Status: None   Collection Time: 10/03/20  2:42 AM   Specimen: BLOOD  Result Value Ref Range Status   Specimen Description BLOOD LEFT HAND  Final   Special Requests   Final    BOTTLES DRAWN AEROBIC ONLY Blood Culture results may not be optimal due to an inadequate volume of blood received in culture bottles   Culture   Final    NO GROWTH 5 DAYS Performed at [facility], [place] [address], [place], [place] [address]    Report Status 10/08/2020 FINAL  Final  Surgical pcr screen     Status: Abnormal   Collection Time: 10/11/20  6:44 PM   Specimen: Nasal Mucosa; Nasal Swab  Result Value Ref Range Status   MRSA, PCR POSITIVE (A) NEGATIVE Final  Comment: RESULT CALLED TO, READ BACK BY AND VERIFIED WITH: F COMBRES RN 10/11/20 2339 JDW    Staphylococcus aureus POSITIVE (A) NEGATIVE Final    Comment: (NOTE) The Xpert SA Assay (FDA approved for NASAL specimens in patients 35 years of age and older), is one component of a comprehensive surveillance program. It is not intended to diagnose infection nor to guide or monitor  treatment. Performed at [facility], [place] [address], [place], [place] [address]   Culture, blood (routine x 2)     Status: None   Collection Time: 10/13/20 10:43 PM   Specimen: BLOOD  Result Value Ref Range Status   Specimen Description BLOOD RIGHT THUMB  Final   Special Requests   Final    BOTTLES DRAWN AEROBIC AND ANAEROBIC Blood Culture adequate volume   Culture   Final    NO GROWTH 5 DAYS Performed at [facility], [place] [address], [place], [place] [address]    Report Status 10/19/2020 FINAL  Final  Culture, blood (routine x 2)     Status: None   Collection Time: 10/13/20 10:43 PM   Specimen: BLOOD RIGHT HAND  Result Value Ref Range Status   Specimen Description BLOOD RIGHT HAND  Final   Special Requests   Final    BOTTLES DRAWN AEROBIC AND ANAEROBIC Blood Culture adequate volume   Culture   Final    NO GROWTH 5 DAYS Performed at [facility], [place] [address], [place], [place] [address]    Report Status 10/19/2020 FINAL  Final  Aerobic/Anaerobic Culture w Gram Stain (surgical/deep wound)     Status: None   Collection Time: 10/14/20 10:38 AM   Specimen: Soft Tissue, Other  Result Value Ref Range Status   Specimen Description TISSUE MITRAL VALVE VEGETATION SPEC A  Final   Special Requests MITRAL VALVE VEGETATION  Final   Gram Stain   Final    WBC PRESENT,BOTH PMN AND MONONUCLEAR NO ORGANISMS SEEN    Culture   Final    No growth aerobically or anaerobically. Performed at [facility], [place] [address], [place], [place] [address]    Report Status 10/19/2020 FINAL  Final    Coagulation Studies: No results for input(s): LABPROT, INR in the last 72 hours.  Urinalysis: No results for input(s): COLORURINE, LABSPEC, PHURINE, GLUCOSEU, HGBUR, BILIRUBINUR, KETONESUR, PROTEINUR, UROBILINOGEN, NITRITE, LEUKOCYTESUR in the last 72 hours.  Invalid input(s): APPERANCEUR    Imaging: DG Tibia/Fibula Right  Result Date: 10/19/2020 CLINICAL DATA:  Right  anterior lower leg swelling 1.5 weeks. No history of injury. EXAM: RIGHT TIBIA AND FIBULA - 2 VIEW COMPARISON:  05/27/2019 FINDINGS: Negative for fracture. Extensive ossification in the soft tissues between the tibia and fibula anteriorly. This is unchanged from the prior study and likely is dystrophic calcification from prior injury. Possible muscle ossification. There is nodular soft tissue swelling of the soft tissues anterior to the proximal tibia question injury or infection. This was not present previously Arterial calcification IMPRESSION: Chronic soft tissue ossification medial to the tibia anteriorly unchanged from the prior study. Likely muscle ossification. New areas of nodular soft tissue swelling anterior to the proximal tibia. Question injury or infection. No acute fracture. Electronically Signed   By: Franchot Gallo M.D.   On: 10/19/2020 15:52   CT TIBIA FIBULA RIGHT W CONTRAST  Addendum Date: 10/20/2020   ADDENDUM REPORT: 10/20/2020 10:32 ADDENDUM: These results were discussed by telephone on 10/20/2020 at 10:32 am to provider  MICHAEL JEFFERY , who verbally acknowledged these results. Electronically Signed   By: Davina Poke D.O.   On: 10/20/2020 10:32   Result Date: 10/20/2020 CLINICAL DATA:  Lower leg soft tissue infection. Focal swelling. Concern for abscess. EXAM: CT OF THE LOWER RIGHT EXTREMITY WITH CONTRAST TECHNIQUE: Multidetector CT imaging of the lower right extremity was performed according to the standard protocol following intravenous contrast administration. CONTRAST:  180m OMNIPAQUE IOHEXOL 300 MG/ML  SOLN COMPARISON:  X-ray 10/19/2020, 05/27/2019 FINDINGS: Bones/Joint/Cartilage No acute fracture. No dislocation. No bony erosion or periosteal elevation. No knee or ankle joint effusion. No significant arthropathy of the knee or ankle joints. Ligaments Suboptimally assessed by CT. Muscles and Tendons Extensive intramuscular calcification throughout the tibialis anterior and  extensor digitorum longus muscles with calcification extending approximately 25 cm in length. No intramuscular fluid collection. Tendinous structures appear intact. Soft tissues Solid appearing circumscribed mass anterior to the midportion of the tibialis anterior muscle measures 3.5 x 1.7 x 3.3 cm (series 4, image 86; series 10, image 13). Hyperdensity within the central and lateral aspects of this mass may reflect enhancement. There is calcification along the posterior and lateral margins. Peripherally enhancing collection the anterior aspect of the lower leg which is draped over the anterior aspect of the proximal tibial diaphysis measuring 4.3 x 1.6 x 3.2 cm (series 4, image 69) series 10, image 33). There also appears to be some enhancement or internal mineralization within the lateral aspect of this collection or mass. Mild subcutaneous edema anteriorly. No soft tissue gas. No deep fascial fluid collections. Extensive arterial atherosclerosis. IMPRESSION: 1. Solid-appearing circumscribed mass anterior to the midportion of the tibialis anterior muscle measures up to 3.5 cm. There is calcification along the posterior and lateral margins. There also appears to be some enhancement or internal mineralization within the lateral aspect of this mass or collection. Findings raise the possibility for a soft tissue neoplasm such as sarcoma. Other entities include but are not limited to amyloid deposition or atypical infection. Further evaluation with ultrasound or contrast-enhanced MRI would be helpful to distinguish cystic versus solid process. 2. Peripherally enhancing collection or mass overlying anterior aspect of the proximal tibial diaphysis measuring up to 4.3 cm. There also appears to be some enhancement or internal mineralization within the lateral aspect of this collection or mass. Abscess is not excluded, although the internal mineralization would be atypical for soft tissue infection. 3. Extensive  intramuscular calcification throughout the tibialis anterior and extensor digitorum longus muscles with calcification extending approximately 25 cm in length. Findings are suggestive of sequela of prior myositis or myonecrosis. 4. No acute osseous abnormality.  No evidence of osteomyelitis. Ordering provider has been paged. Documentation of the communication of the above findings will be added to the report in the form of an addendum. Electronically Signed: By: NDavina PokeD.O. On: 10/20/2020 10:24     Medications:   . sodium chloride    . sodium chloride    . sodium chloride    . sodium chloride    . vancomycin     . aspirin EC  325 mg Oral Daily  . calcium acetate  667 mg Oral TID WC  . Chlorhexidine Gluconate Cloth  6 each Topical Q0600  . cinacalcet  30 mg Oral Q breakfast  . cloNIDine  0.3 mg Transdermal Weekly  . darbepoetin (ARANESP) injection - DIALYSIS  100 mcg Intravenous Q Wed-HD  . docusate sodium  200 mg Oral Daily  . doxercalciferol  3 mcg  Intravenous Q M,W,F-HD  . enoxaparin (LOVENOX) injection  30 mg Subcutaneous Q48H  . feeding supplement (NEPRO CARB STEADY)  237 mL Oral BID BM  . gabapentin  200 mg Oral QHS  . influenza vac split quadrivalent PF  0.5 mL Intramuscular Tomorrow-1000  . metoprolol tartrate  50 mg Oral BID  . multivitamin  1 tablet Oral QHS  . pantoprazole  40 mg Oral Daily  . sodium chloride flush  3 mL Intravenous Q12H   sodium chloride, sodium chloride, sodium chloride, acetaminophen, camphor-menthol, diphenhydrAMINE, ondansetron **OR** ondansetron (ZOFRAN) IV, oxyCODONE, pentafluoroprop-tetrafluoroeth, senna-docusate, sodium chloride flush, traMADol  Assessment/ Plan:  OP HD:MWF [facility] 4h 350/500 new edw here 36- 75kg (was 82.5kg)2/2.5 bath LUA AVG   Hep 5000 -Aranesp 54mgIVq 2wks - last 09/27/20 (Verified order) -Hectorol377m IV qHD   Assessment/Plan: 1. 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 Doing well.    2. Volumeexcess - resolved.  Next dialysis planned for 10/20/2020 3. HTN - continues on clonidine patch and metoprolol 25 bid 4. 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 S/Ptooth extraction3/8/22.Per ID to get 6 wks IV vanc w/ new end date of 11/25/20.  5. ESRD - usual HD MWF.  Next dialysis 10/22/2020 6. Anemiaof CKD-HGB 9- 10 range. S/P 2 units of PRBCs 3/10.  Will start ESA check iron studies 7. Secondary Hyperparathyroidism - sensipar, binders. Continue hect 3ug tiw 8. Hx Hep C /liver cirrhosis. 9. Anterior shin lesion status post I&D per orthopedic    LOS: 24[place]'@TODAY'A'@7'$ :13 AM

## 2020-10-22 DIAGNOSIS — I5032 Chronic diastolic (congestive) heart failure: Secondary | ICD-10-CM | POA: Diagnosis not present

## 2020-10-22 DIAGNOSIS — B182 Chronic viral hepatitis C: Secondary | ICD-10-CM | POA: Diagnosis not present

## 2020-10-22 DIAGNOSIS — R531 Weakness: Secondary | ICD-10-CM | POA: Diagnosis not present

## 2020-10-22 DIAGNOSIS — Z952 Presence of prosthetic heart valve: Secondary | ICD-10-CM | POA: Diagnosis not present

## 2020-10-22 DIAGNOSIS — R7881 Bacteremia: Secondary | ICD-10-CM | POA: Diagnosis not present

## 2020-10-22 DIAGNOSIS — D631 Anemia in chronic kidney disease: Secondary | ICD-10-CM | POA: Diagnosis not present

## 2020-10-22 DIAGNOSIS — N186 End stage renal disease: Secondary | ICD-10-CM | POA: Diagnosis not present

## 2020-10-22 DIAGNOSIS — R0602 Shortness of breath: Secondary | ICD-10-CM | POA: Diagnosis not present

## 2020-10-22 DIAGNOSIS — I10 Essential (primary) hypertension: Secondary | ICD-10-CM | POA: Diagnosis not present

## 2020-10-22 DIAGNOSIS — I639 Cerebral infarction, unspecified: Secondary | ICD-10-CM | POA: Diagnosis not present

## 2020-10-22 DIAGNOSIS — A419 Sepsis, unspecified organism: Secondary | ICD-10-CM | POA: Diagnosis not present

## 2020-10-22 DIAGNOSIS — J189 Pneumonia, unspecified organism: Secondary | ICD-10-CM | POA: Diagnosis not present

## 2020-10-22 DIAGNOSIS — I6310 Cerebral infarction due to embolism of unspecified precerebral artery: Secondary | ICD-10-CM | POA: Diagnosis not present

## 2020-10-22 DIAGNOSIS — J984 Other disorders of lung: Secondary | ICD-10-CM | POA: Diagnosis not present

## 2020-10-22 DIAGNOSIS — L02415 Cutaneous abscess of right lower limb: Secondary | ICD-10-CM | POA: Diagnosis not present

## 2020-10-22 DIAGNOSIS — Z992 Dependence on renal dialysis: Secondary | ICD-10-CM | POA: Diagnosis not present

## 2020-10-22 DIAGNOSIS — N2581 Secondary hyperparathyroidism of renal origin: Secondary | ICD-10-CM | POA: Diagnosis not present

## 2020-10-22 DIAGNOSIS — I5042 Chronic combined systolic (congestive) and diastolic (congestive) heart failure: Secondary | ICD-10-CM | POA: Diagnosis not present

## 2020-10-22 DIAGNOSIS — I330 Acute and subacute infective endocarditis: Secondary | ICD-10-CM | POA: Diagnosis not present

## 2020-10-22 DIAGNOSIS — I132 Hypertensive heart and chronic kidney disease with heart failure and with stage 5 chronic kidney disease, or end stage renal disease: Secondary | ICD-10-CM | POA: Diagnosis not present

## 2020-10-22 LAB — CBC WITH DIFFERENTIAL/PLATELET
Abs Immature Granulocytes: 0.24 10*3/uL — ABNORMAL HIGH (ref 0.00–0.07)
Basophils Absolute: 0 10*3/uL (ref 0.0–0.1)
Basophils Relative: 0 %
Eosinophils Absolute: 0.2 10*3/uL (ref 0.0–0.5)
Eosinophils Relative: 2 %
HCT: 30.3 % — ABNORMAL LOW (ref 39.0–52.0)
Hemoglobin: 9.6 g/dL — ABNORMAL LOW (ref 13.0–17.0)
Immature Granulocytes: 2 %
Lymphocytes Relative: 12 %
Lymphs Abs: 1.5 10*3/uL (ref 0.7–4.0)
MCH: 28.5 pg (ref 26.0–34.0)
MCHC: 31.7 g/dL (ref 30.0–36.0)
MCV: 89.9 fL (ref 80.0–100.0)
Monocytes Absolute: 1.9 10*3/uL — ABNORMAL HIGH (ref 0.1–1.0)
Monocytes Relative: 16 %
Neutro Abs: 8.1 10*3/uL — ABNORMAL HIGH (ref 1.7–7.7)
Neutrophils Relative %: 68 %
Platelets: 313 10*3/uL (ref 150–400)
RBC: 3.37 MIL/uL — ABNORMAL LOW (ref 4.22–5.81)
RDW: 18.3 % — ABNORMAL HIGH (ref 11.5–15.5)
WBC: 12 10*3/uL — ABNORMAL HIGH (ref 4.0–10.5)
nRBC: 0 % (ref 0.0–0.2)

## 2020-10-22 LAB — RENAL FUNCTION PANEL
Albumin: 2.1 g/dL — ABNORMAL LOW (ref 3.5–5.0)
Anion gap: 15 (ref 5–15)
BUN: 41 mg/dL — ABNORMAL HIGH (ref 6–20)
CO2: 24 mmol/L (ref 22–32)
Calcium: 8.2 mg/dL — ABNORMAL LOW (ref 8.9–10.3)
Chloride: 93 mmol/L — ABNORMAL LOW (ref 98–111)
Creatinine, Ser: 6.31 mg/dL — ABNORMAL HIGH (ref 0.61–1.24)
GFR, Estimated: 10 mL/min — ABNORMAL LOW (ref >60)
Glucose, Bld: 97 mg/dL (ref 70–99)
Phosphorus: 5.8 mg/dL — ABNORMAL HIGH (ref 2.5–4.6)
Potassium: 3.9 mmol/L (ref 3.5–5.1)
Sodium: 132 mmol/L — ABNORMAL LOW (ref 135–145)

## 2020-10-22 LAB — SURGICAL PATHOLOGY

## 2020-10-22 MED ORDER — OXYCODONE HCL 5 MG PO TABS
5.0000 mg | ORAL_TABLET | ORAL | Status: DC | PRN
  Administered 2020-10-22 – 2020-10-29 (×15): 10 mg via ORAL
  Filled 2020-10-22 (×14): qty 2

## 2020-10-22 MED ORDER — DOXERCALCIFEROL 4 MCG/2ML IV SOLN
INTRAVENOUS | Status: AC
  Administered 2020-10-22: 3 ug via INTRAVENOUS
  Filled 2020-10-22: qty 2

## 2020-10-22 MED ORDER — SENNOSIDES-DOCUSATE SODIUM 8.6-50 MG PO TABS
1.0000 | ORAL_TABLET | Freq: Two times a day (BID) | ORAL | Status: DC
  Administered 2020-10-22 – 2020-11-10 (×25): 1 via ORAL
  Filled 2020-10-22 (×32): qty 1

## 2020-10-22 MED ORDER — HYDROMORPHONE HCL 2 MG PO TABS
1.0000 mg | ORAL_TABLET | ORAL | Status: DC | PRN
  Administered 2020-10-22 – 2020-10-23 (×5): 1 mg via ORAL
  Filled 2020-10-22 (×5): qty 1

## 2020-10-22 MED ORDER — OXYCODONE HCL 5 MG PO TABS
ORAL_TABLET | ORAL | Status: AC
  Filled 2020-10-22: qty 2

## 2020-10-22 NOTE — Progress Notes (Signed)
Patient is status post day 2 irrigation and debridement of his leg.  He has a moderate amount of pain which is unchanged.  Dressings in place without any drainage.  Awaiting cultures which are currently negative also pathology will check with him today.

## 2020-10-22 NOTE — Progress Notes (Signed)
Patient ID: Patrick Anthony, male   DOB: 04/27/1971, 50 y.o.   MRN: [id number]   Patient's pathology report from the tibia is reviewed, it is showing osteomyelitis.  The cultures are showing staph aureus.  Patient's findings are consistent with his previous blood cultures of MRSA.  I discussed with the patient recommendation to proceed with an above-the-knee amputation.  Discussed that there are no local interventions available for limb salvage due to the extensive nature of the osteomyelitis involving essentially the entire tibia.  I have recommended proceeding with surgery as soon as possible.  Recommended proceeding with surgery tomorrow Saturday.  Patient states that he wants to go home and discuss this with his family.  I discussed the risk of infecting his new heart valve if the source of the bacteremia is not resolved.  Patient will discuss this with his family I will discuss this again with the patient tomorrow morning Saturday.

## 2020-10-22 NOTE — Progress Notes (Signed)
[facility] Progress Note   Assessment/ Plan:   OP HD:MWF [facility] 4h 350/500 new edw here 28- 75kg (was82.5kg)2/2.5 bath LUA AVGHep 5000 -Aranesp 59mgIVq 2wks - last 09/27/20 (Verified order) -Hectorol329m IV qHD   Assessment/Plan: 1. SP min invasive MVR(bioprosthetic)replacement - on 3/10. Doing well.   2. Volumeexcess - resolved.  Next dialysis planned for 10/20/2020 3. HTN - continues onclonidine patch and metoprolol 25 bid 4. 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 S/Ptooth extraction3/8/22.PerID to get 6 wks IV vanc w/ new end date of 11/25/20. 5. ESRD - usual HDMWF.  Next dialysis 10/22/2020 6. Anemiaof CKD-HGB 9- 10 range. S/P 2 units of PRBCs 3/10.  Will start ESA check iron studies 7. Secondary Hyperparathyroidism - sensipar, binders.Continuehect 3ug tiw 8. Hx Hep C /liver cirrhosis. 9. Anterior shin lesion status post I&D per orthopedics--> awaiting cultures.  ? If will need BKA, pt is very upset about this possiblity.     Subjective:    Seen in room.  Awaiting cultures from R leg I and D.  Very upset at the possibility of amputation.   Objective:   BP (!) 130/98 (BP Location: Right Arm)   Pulse 79   Temp 98.2 F (36.8 C) (Oral)   Resp 20   Ht 6' 0.01" (1.829 m)   Wt 76.2 kg   SpO2 100%   BMI 22.79 kg/m   Physical Exam: Gen: looks pretty down CVS: RRR Resp: clear Abd: soft Ext: R leg dressed ACCESS: L AVG  Labs: BMET Recent Labs  Lab 10/15/20 1641 10/16/20 0324 10/18/20 0148 10/20/20 0148 10/20/20 1937 10/21/20 0520 10/22/20 0158  NA 134* 134* 127* 130* 126* 132* 132*  K 3.7 4.1 4.3 3.9 5.0 3.9 3.9  CL 98 97* 93* 93* 90* 94* 93*  CO2 '24 26 25 24 23 23 24  '$ GLUCOSE 125* 99 80 92 121* 103* 97  BUN 12 17 28* 37* 45* 22* 41*  CREATININE 3.34* 4.46* 5.37* 6.67* 7.98* 4.69* 6.31*  CALCIUM 8.4* 8.8* 8.1* 8.6* 8.3* 8.3* 8.2*  PHOS  --   --   --  5.1*  5.5* 4.3 5.8*   CBC Recent Labs  Lab 10/20/20 0148 10/20/20 1937 10/21/20 0520 10/21/20 1101 10/22/20 0158  WBC 11.3* 14.1* 10.1 12.0* 12.0*  NEUTROABS 7.6  --  8.1*  --  8.1*  HGB 9.2* 9.1* 9.7* 9.6* 9.6*  HCT 28.6* 29.2* 29.9* 29.6* 30.3*  MCV 89.9 89.8 88.2 89.7 89.9  PLT 243 296 92* 332 313      Medications:    . (feeding supplement) PROSource Plus  30 mL Oral BID BM  . aspirin EC  325 mg Oral Daily  . calcium acetate  667 mg Oral TID WC  . Chlorhexidine Gluconate Cloth  6 each Topical Q0600  . Chlorhexidine Gluconate Cloth  6 each Topical Q0600  . cinacalcet  30 mg Oral Q breakfast  . cloNIDine  0.3 mg Transdermal Weekly  . [START ON 10/29/2020] darbepoetin (ARANESP) injection - DIALYSIS  60 mcg Intravenous Q Fri-HD  . doxercalciferol  3 mcg Intravenous Q M,W,F-HD  . enoxaparin (LOVENOX) injection  30 mg Subcutaneous Q48H  . feeding supplement (NEPRO CARB STEADY)  237 mL Oral TID BM  . gabapentin  200 mg Oral QHS  . influenza vac split quadrivalent PF  0.5 mL Intramuscular Tomorrow-1000  . metoprolol tartrate  50 mg Oral BID  . multivitamin  1 tablet Oral QHS  . pantoprazole  40 mg Oral Daily  . senna-docusate  1 tablet Oral BID  . sodium chloride flush  3 mL Intravenous Q12H    Madelon Lips, MD 10/22/2020, 12:21 PM

## 2020-10-22 NOTE — Progress Notes (Signed)
PROGRESS NOTE    Patrick Anthony  [id number] DOB: 07-Jul-1971 DOA: 09/27/2020 PCP: [name], MD   Chief Complaint  Patient presents with  . 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  Patient noted to have a MRSA bacteremia complicated by embolic CVA felt likely secondary to endocarditis of the mitral valve noted on 2D echo.  Patient had follow-up TEE which showed a large vegetation with no evidence of intra-arterial shunt. ID consulted and following up on blood cultures and patient placed empirically on IV vancomycin.  CT surgery consulted for surgical intervention for mitral valve endocarditis.  Patient also seen in consultation by cardiology.  Patient subsequently underwent minimally invasive mitral valve replacement per CT surgery 10/14/2020. Patient was on CT surgeons service and subsequently transferred to [facility]. -Patient seen in consultation by orthopedics as patient noted to have acute abscess of right leg with calcification between the tibia and fibula and patient for irrigation and debridement of abscess with placement of wound VAC today 10/20/2020, and likely to return to surgery on Friday. Neurology was consulted and following, ID consulted and following, CT surgery following.   Assessment & Plan:   Principal Problem:   Bacterial endocarditis Active Problems:   Chronic combined systolic and diastolic congestive heart failure (HCC)   HTN (hypertension)   Anemia in chronic kidney disease   Hepatitis C   ESRD on dialysis ([place])   End stage renal disease (HCC)   Chest pain   Sepsis ([place])   Pneumonia due to infectious agent   ADPKD  (autosomal dominant polycystic kidney disease)   Sepsis due to pneumonia (HCC)   Lactic acidosis   Cerebral embolism with cerebral infarction   Bacteremia due to Staphylococcus aureus   Mitral regurgitation   Uncontrolled hypertension   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  Currently afebrile. -Patient noted to have positive blood cultures of MRSA 09/27/2020 and 09/30/2020. -Repeat blood cultures 10/03/2020 -. -TEE 10/05/2020 with LAA thrombus in 1.5 cm MV vegetation with negative bubble study. -Status post multiple teeth extraction 10/12/2020. -Status post minimally invasive mitral valve replacement 10/14/2020 and closure of PFO. -ID noted to have reviewed operative note that showed bulky purulent vegetation.  Per ID due to recent fever 10/13/2020, operative finding and poor tissue penetration by vancomycin recommending 6 weeks of antibiotics from 10/14/2020. -Continue IV vancomycin with HD through 11/25/2020. -We will need outpatient follow-up in ID clinic post discharge (3/24 with Dr Gale Journey) -CT surgery following.  2.  Chronic hep C Genotype ordered.  Outpatient follow-up with ID for treatment.  3.  Right tib-fib abscess Patient s/p irrigation and debridement (10/20/2020) per Dr. Sharol Given, orthopedics with probable surgery  intervention again in the next day or 2 pending pathology and culture results.   -Told patient of the  possibility of possible amputation which he is very resistant to at this time and upset about.   -Continue empiric IV vancomycin.   -Per orthopedics.    4.  End-stage renal disease on hemodialysis -Patient being followed by nephrology and receiving HD during this hospitalization. -Per nephrology.  5.  Hyponatremia -Improved on HD.  Per nephrology.   6.  Hypertension Continue metoprolol, clonidine patch.   7.  Anemia of chronic disease Status post transfusion 2 units packed red blood cells 10/14/2020. -Anemia panel with iron level of 23, TIBC BC of 193.   -Hemoglobin stable at 9.6.   -Transfusion threshold hemoglobin < 7.  -Follow.        DVT prophylaxis: Lovenox. Code Status: Full Family Communication: Updated patient.  No family at bedside. Disposition:   Status is: Inpatient    Dispo: The patient is from: Home              Anticipated d/c is to: To be determined              Patient currently on IV antibiotics for MRSA endocarditis with embolic CVAs, status post mitral valve replacement,  s/p I&D of tibial abscess (10/20/2020) and patient for further surgical intervention in the next day or 2 pending pathology and culture results per orthopedics..  Not stable for discharge.   Difficult to place patient undetermined       Consultants:   Orthopedics: Dr. Sharol Given 10/20/2020  Nephrology  Neurology: Dr. Rory Percy 09/28/2020  Infectious disease: Dr. Graylon Good 09/28/2020  Nephrology: Dr.Schertz 09/28/2020  CT surgery Dr. Roxy Manns 10/05/2020  Orthopedics: Dr. Percell Miller 10/19/2020  Procedures:   CT abdomen and pelvis 09/27/2020, 09/29/2020  CT head 09/28/2020  CT chest 09/29/2020  CT tib-fib 10/20/2020 pending  Plain films right tib-fib 10/19/2020  Brain 09/28/2020  MRI angiogram head and neck 09/28/2020  MRI T-spine L-spine 09/30/2020  TEE 10/05/2020  Cardiac catheterization 10/07/2020--Dr. Berry  Upper extremity Dopplers 10/08/2020  Irrigation and debridement right lower  extremities per Dr. Sharol Given 10/20/2020  Minimally invasive mitral valve replacement per Dr. Roxy Manns 10/14/2020  Antimicrobials:  Anti-infectives (From admission, onward)   Start     Dose/Rate Route Frequency Ordered Stop   10/21/20 0600  ceFAZolin (ANCEF) IVPB 2g/100 mL premix        2 g 200 mL/hr over 30 Minutes Intravenous On call to O.R. 10/20/20 1349 10/21/20 0650   10/20/20 2243  vancomycin (VANCOCIN) 500-5 MG/100ML-% IVPB       Note to Pharmacy: Wallace Cullens   : cabinet override      10/20/20 2243 10/20/20 2243   10/20/20 1437  ceFAZolin (ANCEF) 2-4 GM/100ML-% IVPB       Note to Pharmacy: Ladoris Gene   : cabinet override      10/20/20 1437 10/20/20 1612   10/20/20 1200  vancomycin (VANCOREADY) IVPB 500 mg/100 mL        500 mg 100 mL/hr over 60 Minutes Intravenous Every M-W-F (Hemodialysis) 10/20/20 0903     10/18/20 1124  vancomycin variable dose per unstable renal function (pharmacist dosing)  Status:  Discontinued         Does not apply See admin instructions 10/18/20 1124 10/20/20 0903   10/16/20 1600  vancomycin (VANCOREADY) IVPB 500 mg/100 mL  Status:  Discontinued        500 mg 100 mL/hr over 60 Minutes Intravenous Every Sat (  Hemodialysis) 10/16/20 1306 10/16/20 1312   10/15/20 1500  vancomycin (VANCOCIN) IVPB 500 mg/100 ml premix  Status:  Discontinued        500 mg 100 mL/hr over 60 Minutes Intravenous Every M-W-F (Hemodialysis) 10/15/20 1357 10/17/20 0942   10/14/20 2130  vancomycin (VANCOCIN) IVPB 1000 mg/200 mL premix  Status:  Discontinued        1,000 mg 200 mL/hr over 60 Minutes Intravenous  Once 10/14/20 1403 10/14/20 1547   10/14/20 2130  vancomycin (VANCOREADY) IVPB 500 mg/100 mL        500 mg 100 mL/hr over 60 Minutes Intravenous  Once 10/14/20 1547 10/14/20 2222   10/14/20 1730  cefUROXime (ZINACEF) 1.5 g in sodium chloride 0.9 % 100 mL IVPB        1.5 g 200 mL/hr over 30 Minutes Intravenous Every 24 hr x 2 10/14/20 1403 10/15/20 1718   10/14/20 1128   vancomycin (VANCOCIN) 1,000 mg in sodium chloride 0.9 % 1,000 mL irrigation  Status:  Discontinued          As needed 10/14/20 1128 10/14/20 1401   10/14/20 0400  vancomycin (VANCOREADY) IVPB 1250 mg/250 mL        1,250 mg 166.7 mL/hr over 90 Minutes Intravenous To Surgery 10/13/20 1039 10/14/20 0910   10/14/20 0400  cefUROXime (ZINACEF) 1.5 g in sodium chloride 0.9 % 100 mL IVPB        1.5 g 200 mL/hr over 30 Minutes Intravenous To Surgery 10/13/20 1039 10/14/20 0835   10/14/20 0400  cefUROXime (ZINACEF) 750 mg in sodium chloride 0.9 % 100 mL IVPB        750 mg 200 mL/hr over 30 Minutes Intravenous To Surgery 10/13/20 1039 10/14/20 1250   10/14/20 0400  vancomycin (VANCOCIN) 1,000 mg in sodium chloride 0.9 % 1,000 mL irrigation  Status:  Discontinued         Irrigation To Surgery 10/13/20 1039 10/14/20 1403   10/13/20 1657  vancomycin (VANCOCIN) 500-5 MG/100ML-% IVPB       Note to Pharmacy: Judieth Keens  : cabinet override      10/13/20 1657 10/13/20 1717   10/12/20 0600  ceFAZolin (ANCEF) IVPB 2g/100 mL premix        2 g 200 mL/hr over 30 Minutes Intravenous On call to O.R. 10/11/20 1841 10/12/20 1315   10/11/20 1415  vancomycin (VANCOCIN) IVPB 500 mg/100 ml premix  Status:  Discontinued        500 mg 100 mL/hr over 60 Minutes Intravenous Every M-W-F (Hemodialysis) 10/11/20 1413 10/14/20 1403   10/11/20 1413  vancomycin (VANCOCIN) 500-5 MG/100ML-% IVPB       Note to Pharmacy: Kalman Shan   : cabinet override      10/11/20 1413 10/12/20 0229   10/11/20 1353  Vancomycin (VANCOCIN) 750-5 MG/150ML-% IVPB  Status:  Discontinued       Note to Pharmacy: Kalman Shan   : cabinet override      10/11/20 1353 10/11/20 1417   10/08/20 1200  vancomycin (VANCOCIN) IVPB 750 mg/150 ml premix  Status:  Discontinued        750 mg 150 mL/hr over 60 Minutes Intravenous Every M-W-F (Hemodialysis) 10/05/20 1404 10/11/20 1413   10/06/20 1619  vancomycin (VANCOCIN) 500-5 MG/100ML-% IVPB       Note  to Pharmacy: Judieth Keens  : cabinet override      10/06/20 1619 10/06/20 1736   10/06/20 1200  vancomycin (VANCOCIN) IVPB 750 mg/150 ml premix  Status:  Discontinued        750 mg 150 mL/hr over 60 Minutes Intravenous Every M-W-F (Hemodialysis) 10/04/20 0905 10/05/20 1404   10/06/20 1200  vancomycin (VANCOCIN) IVPB 500 mg/100 ml premix        500 mg 100 mL/hr over 60 Minutes Intravenous Every Wed (Hemodialysis) 10/05/20 1404 10/06/20 2000   10/01/20 2000  DAPTOmycin (CUBICIN) 840 mg in sodium chloride 0.9 % IVPB  Status:  Discontinued        840 mg 233.6 mL/hr over 30 Minutes Intravenous Every 48 hours 10/01/20 0922 10/01/20 1056   10/01/20 1200  vancomycin (VANCOCIN) IVPB 1000 mg/200 mL premix  Status:  Discontinued        1,000 mg 200 mL/hr over 60 Minutes Intravenous Every M-W-F (Hemodialysis) 10/01/20 1056 10/04/20 0845   09/29/20 1200  vancomycin (VANCOCIN) IVPB 1000 mg/200 mL premix  Status:  Discontinued        1,000 mg 200 mL/hr over 60 Minutes Intravenous Every M-W-F (Hemodialysis) 09/27/20 1443 10/01/20 0922   09/29/20 1200  ceFEPIme (MAXIPIME) 2 g in sodium chloride 0.9 % 100 mL IVPB  Status:  Discontinued        2 g 200 mL/hr over 30 Minutes Intravenous Every M-W-F (Hemodialysis) 09/27/20 1443 09/28/20 1054   09/28/20 0900  metroNIDAZOLE (FLAGYL) IVPB 500 mg  Status:  Discontinued        500 mg 100 mL/hr over 60 Minutes Intravenous Every 12 hours 09/27/20 1808 09/28/20 1054   09/28/20 0900  ceFEPIme (MAXIPIME) 1 g in sodium chloride 0.9 % 100 mL IVPB  Status:  Discontinued        1 g 200 mL/hr over 30 Minutes Intravenous Every 12 hours 09/27/20 1808 09/27/20 1812   09/27/20 1445  vancomycin (VANCOREADY) IVPB 2000 mg/400 mL        2,000 mg 200 mL/hr over 120 Minutes Intravenous  Once 09/27/20 1433 09/27/20 2330   09/27/20 1430  ceFEPIme (MAXIPIME) 2 g in sodium chloride 0.9 % 100 mL IVPB        2 g 200 mL/hr over 30 Minutes Intravenous  Once 09/27/20 1418 09/27/20  1541   09/27/20 1430  metroNIDAZOLE (FLAGYL) IVPB 500 mg        500 mg 100 mL/hr over 60 Minutes Intravenous  Once 09/27/20 1418 09/27/20 2214   09/27/20 1430  vancomycin (VANCOREADY) IVPB 1750 mg/350 mL  Status:  Discontinued        1,750 mg 175 mL/hr over 120 Minutes Intravenous  Once 09/27/20 1418 09/27/20 1433       Subjective: Sleeping but arousable.  Some intermittent shortness of breath.  Complains of some intermittent chest pain.  Still with complaints of right lower extremity pain.  Tolerating current diet.    Objective: Vitals:   10/21/20 2007 10/21/20 2301 10/22/20 0300 10/22/20 0957  BP: (!) 146/103 (!) 140/100 (!) 142/102 (!) 130/98  Pulse: 88 85 82 79  Resp: '18 18 16 20  '$ Temp: 98.7 F (37.1 C) 98.2 F (36.8 C) 98.7 F (37.1 C) 98.2 F (36.8 C)  TempSrc: Oral Oral Oral Oral  SpO2: 98% 98% 97% 100%  Weight:   76.2 kg   Height:        Intake/Output Summary (Last 24 hours) at 10/22/2020 1127 Last data filed at 10/21/2020 1300 Gross per 24 hour  Intake 240 ml  Output --  Net 240 ml   Filed Weights   10/21/20 0134 10/21/20 0438 10/22/20 0300  Weight: 75.8 kg  75.8 kg 76.2 kg    Examination:  General exam: NAD. Respiratory system: Some bibasilar crackles.  No wheezing.  No rhonchi.  Fair air movement.  Speaking in full sentences.  Cardiovascular system: Regular rate rhythm no murmurs rubs or gallops.  No JVD.  No lower extremity edema.  Gastrointestinal system: Abdomen is soft, nontender, nondistended, positive bowel sounds.  No rebound.  No guarding.  Central nervous system: Alert and oriented. No focal neurological deficits. Extremities: Right lower extremity with postop bandage.  Some tenderness to palpation.   Skin: No rashes, lesions or ulcers Psychiatry: Judgement and insight appear normal. Mood & affect appropriate.     Data Reviewed: I have personally reviewed following labs and imaging studies  CBC: Recent Labs  Lab 10/20/20 0148  10/20/20 1937 10/21/20 0520 10/21/20 1101 10/22/20 0158  WBC 11.3* 14.1* 10.1 12.0* 12.0*  NEUTROABS 7.6  --  8.1*  --  8.1*  HGB 9.2* 9.1* 9.7* 9.6* 9.6*  HCT 28.6* 29.2* 29.9* 29.6* 30.3*  MCV 89.9 89.8 88.2 89.7 89.9  PLT 243 296 92* 332 [id number]    Basic Metabolic Panel: Recent Labs  Lab 10/15/20 1641 10/16/20 0324 10/18/20 0148 10/20/20 0148 10/20/20 1937 10/21/20 0520 10/22/20 0158  NA 134*   < > 127* 130* 126* 132* 132*  K 3.7   < > 4.3 3.9 5.0 3.9 3.9  CL 98   < > 93* 93* 90* 94* 93*  CO2 24   < > '25 24 23 23 24  '$ GLUCOSE 125*   < > 80 92 121* 103* 97  BUN 12   < > 28* 37* 45* 22* 41*  CREATININE 3.34*   < > 5.37* 6.67* 7.98* 4.69* 6.31*  CALCIUM 8.4*   < > 8.1* 8.6* 8.3* 8.3* 8.2*  MG 2.0  --   --   --   --   --   --   PHOS  --   --   --  5.1* 5.5* 4.3 5.8*   < > = values in this interval not displayed.    GFR: Estimated Creatinine Clearance: 15.3 mL/min (A) (by C-G formula based on SCr of 6.31 mg/dL (H)).  Liver Function Tests: Recent Labs  Lab 10/20/20 0148 10/20/20 1937 10/21/20 0520 10/22/20 0158  ALBUMIN 2.1* 2.2* 2.1* 2.1*    CBG: Recent Labs  Lab 10/16/20 1529 10/16/20 2134 10/17/20 0625 10/17/20 0807 10/17/20 1158  GLUCAP 105* 111* 82 76 84     Recent Results (from the past 240 hour(s))  Culture, blood (routine x 2)     Status: None   Collection Time: 10/13/20 10:43 PM   Specimen: BLOOD  Result Value Ref Range Status   Specimen Description BLOOD RIGHT THUMB  Final   Special Requests   Final    BOTTLES DRAWN AEROBIC AND ANAEROBIC Blood Culture adequate volume   Culture   Final    NO GROWTH 5 DAYS Performed at [facility], [address] [place], [place] [address]    Report Status 10/19/2020 FINAL  Final  Culture, blood (routine x 2)     Status: None   Collection Time: 10/13/20 10:43 PM   Specimen: BLOOD RIGHT HAND  Result Value Ref Range Status   Specimen Description BLOOD RIGHT HAND  Final   Special Requests   Final     BOTTLES DRAWN AEROBIC AND ANAEROBIC Blood Culture adequate volume   Culture   Final    NO GROWTH 5 DAYS Performed at [facility]  [facility], [place] [address] [place], [place] [address]    Report Status 10/19/2020 FINAL  Final  Aerobic/Anaerobic Culture w Gram Stain (surgical/deep wound)     Status: None   Collection Time: 10/14/20 10:38 AM   Specimen: Soft Tissue, Other  Result Value Ref Range Status   Specimen Description TISSUE MITRAL VALVE VEGETATION SPEC A  Final   Special Requests MITRAL VALVE VEGETATION  Final   Gram Stain   Final    WBC PRESENT,BOTH PMN AND MONONUCLEAR NO ORGANISMS SEEN    Culture   Final    No growth aerobically or anaerobically. Performed at [facility], [place] [address] [place], [place] [address]    Report Status 10/19/2020 FINAL  Final  Aerobic/Anaerobic Culture w Gram Stain (surgical/deep wound)     Status: None (Preliminary result)   Collection Time: 10/20/20  4:12 PM   Specimen: Soft Tissue, Other  Result Value Ref Range Status   Specimen Description TISSUE  Final   Special Requests RIGHT LEG ABSC SPEC A  Final   Gram Stain   Final    RARE WBC PRESENT,BOTH PMN AND MONONUCLEAR NO ORGANISMS SEEN    Culture   Final    FEW STAPHYLOCOCCUS AUREUS SUSCEPTIBILITIES TO FOLLOW CRITICAL RESULT CALLED TO, READ BACK BY AND VERIFIED WITH: RN N.[email or web address] AT LC:[id number] ON 10/22/2020 BY T.SAAD Performed at [facility], [place] [address], [place], [place] [address]    Report Status PENDING  Incomplete         Radiology Studies: No results found.      Scheduled Meds: . (feeding supplement) PROSource Plus  30 mL Oral BID BM  . aspirin EC  325 mg Oral Daily  . calcium acetate  667 mg Oral TID WC  . Chlorhexidine Gluconate Cloth  6 each Topical Q0600  . Chlorhexidine Gluconate Cloth  6 each Topical Q0600  . cinacalcet  30 mg Oral Q breakfast  . cloNIDine  0.3 mg Transdermal Weekly  . [START ON 10/29/2020] darbepoetin (ARANESP) injection - DIALYSIS  60  mcg Intravenous Q Fri-HD  . doxercalciferol  3 mcg Intravenous Q M,W,F-HD  . enoxaparin (LOVENOX) injection  30 mg Subcutaneous Q48H  . feeding supplement (NEPRO CARB STEADY)  237 mL Oral TID BM  . gabapentin  200 mg Oral QHS  . influenza vac split quadrivalent PF  0.5 mL Intramuscular Tomorrow-1000  . metoprolol tartrate  50 mg Oral BID  . multivitamin  1 tablet Oral QHS  . pantoprazole  40 mg Oral Daily  . senna-docusate  1 tablet Oral BID  . sodium chloride flush  3 mL Intravenous Q12H   Continuous Infusions: . sodium chloride    . sodium chloride    . sodium chloride    . sodium chloride    . vancomycin       LOS: 25 days    Time spent: 40 minutes    Irine Seal, MD [facility]   To contact the attending provider between 7A-7P or the covering provider during after hours 7P-7A, please log into the web site [email or web address] and access using universal [place] password for that web site. If you do not have the password, please call the hospital operator.  10/22/2020, 11:27 AM

## 2020-10-22 NOTE — Progress Notes (Signed)
CARDIAC SURGERY BRIEF PROGRESS NOTE  8 Days Post-Op Procedure(s) (LRB): MINIMALLY INVASIVE MITRAL VALVE  REPLACEMENT(MVR) USING MEDTRONIC MOSAIC VALVE SIZE 31MM (Right) TRANSESOPHAGEAL ECHOCARDIOGRAM (TEE) (N/A) CLOSURE OF PATENT FORAMEN OVALE (N/A)  2 Days Post-Op  S/P Procedure(s) (LRB): IRRIGATION AND DEBRIDEMENT OF LEG (Right)   Clinically doing well from cardiac standpoint Patient has very limited understanding of the severity of his problem involving his right lower leg  Will continue to follow intermittently, but please call if specific problems or questions arise  Rexene Alberts, MD 10/22/2020 8:59 AM

## 2020-10-22 NOTE — Progress Notes (Signed)
Mobility Specialist: Progress Note   10/22/20 1349  Mobility  Activity Ambulated in hall  Level of Assistance Contact guard assist, steadying assist  Assistive Device Four wheel walker  Distance Ambulated (ft) 470 ft  Mobility Response Tolerated well  Mobility performed by Mobility specialist  $Mobility charge 1 Mobility   Pre-Mobility: 83 HR Post-Mobility: 88 HR, 116/105 BP  Pt c/o 6/10 pain in RLE and back during ambulation and is requesting pain medication, RN present. Pt back to bed after walk to be taken to HD.   [facility] Katherin Ramey Mobility Specialist Mobility Specialist Phone: [phone]

## 2020-10-22 NOTE — Progress Notes (Signed)
Occupational Therapy Treatment Patient Details Name: Patrick Anthony MRN: [id number] DOB: 1971/04/22 Today's Date: 10/22/2020    History of present illness Pt is a 50 y.o. male admitted 09/27/20 with fever, chills, chest pain, SOB, generalized weakness. Pt found to have MRSA bacteremia. 2/22 MRI showed multiple scattered small infarcts (bilateral cerebral hemispheres, R basal ganglia, bileteral cerebellum); suspect embolic process. Echo showed mitral valve vegetation, mild-moderate mitral regurgitation. No significant CAD on L heart cath. 3/8 dental extraction. 3/10 mini MVR with closure of PFO. 3/15 orthopedics consulted due to RLE possible abscess. PMH includes CKD (HD MWF), HTN, CHF, Hep C, polysubstance abuse.   OT comments  Pt making steady progress towards OT goals this session. Pt continues to present with increased pain ( chronic back pain and BLEs ( RLE>LLE)), generalized deconditioning, and impaired ability to reach BLEs. Issued pt LB AE for all LB ADLs with pt able to don sock with sock aid with MODA, full demonstration provided of all AE. Pt completed functional mobility with RW and min guard assist for safety. Pt requires supervision for standing ADLs at sink. Pt would continue to benefit from skilled occupational therapy while admitted and after d/c to address the below listed limitations in order to improve overall functional mobility and facilitate independence with BADL participation. DC plan remains appropriate, will follow acutely per POC.     Follow Up Recommendations  Home health OT;Supervision - Intermittent    Equipment Recommendations  None recommended by OT    Recommendations for Other Services      Precautions / Restrictions Precautions Precautions: Fall Restrictions Weight Bearing Restrictions: Yes RLE Weight Bearing: Weight bearing as tolerated       Mobility Bed Mobility Overal bed mobility: Needs Assistance Bed Mobility: Supine to Sit     Supine to sit:  Supervision     General bed mobility comments: supervision for safety, increased time and effort noted d/t pain    Transfers Overall transfer level: Needs assistance Equipment used: Rolling walker (2 wheeled) Transfers: Sit to/from Stand Sit to Stand: Supervision         General transfer comment: supervision for safety from EOB    Balance Overall balance assessment: Needs assistance Sitting-balance support: No upper extremity supported;Feet supported Sitting balance-Leahy Scale: Good     Standing balance support: No upper extremity supported;During functional activity Standing balance-Leahy Scale: Fair Standing balance comment: able to complete standing grooming tasks at sink with no UE support or LOB                           ADL either performed or assessed with clinical judgement   ADL Overall ADL's : Needs assistance/impaired     Grooming: Oral care;Standing;Supervision/safety;Wash/dry face Grooming Details (indicate cue type and reason): standing at sink with RW       Lower Body Bathing Details (indicate cue type and reason): education provided on AE for LB bathing with LH sponge Upper Body Dressing : Set up;Standing Upper Body Dressing Details (indicate cue type and reason): to don posterior gown in standing Lower Body Dressing: Moderate assistance;Sitting/lateral leans;With adaptive equipment;Cueing for sequencing Lower Body Dressing Details (indicate cue type and reason): pt able to don socks with sock aid with MOD A needed cues to sequence novel task, all education and demonstration provided on all LB AE for dressing Toilet Transfer: Min guard;RW;Ambulation Toilet Transfer Details (indicate cue type and reason): simulated via functional mobility with RW and min guard for safety  Functional mobility during ADLs: Min guard;Rolling walker General ADL Comments: continued education provided on energy conservation, pt completing functional  mobility at Rw level, LB ADLS with AE, and standing grooming tasks.     Vision       Perception     Praxis      Cognition Arousal/Alertness: Awake/alert Behavior During Therapy: WFL for tasks assessed/performed;Flat affect Overall Cognitive Status: No family/caregiver present to determine baseline cognitive functioning                                 General Comments: internally distracted by pain and MD entering during session alerting pt that RLE amputation is likely therefore pt flat most of session reproting feeling of frustration, noted to keep eyes closed a lot during session, even during ambulation        Exercises     Shoulder Instructions       General Comments VSS on RA,continued education and handout isssued on ECS    Pertinent Vitals/ Pain       Pain Assessment: 0-10 Pain Score: 6  Pain Location: back and BLEs Pain Descriptors / Indicators: Grimacing;Tender;Sore Pain Intervention(s): Limited activity within patient's tolerance;Monitored during session;Repositioned;Patient requesting pain meds-RN notified  Home Living                                          Prior Functioning/Environment              Frequency  Min 2X/week        Progress Toward Goals  OT Goals(current goals can now be found in the care plan section)  Progress towards OT goals: Progressing toward goals  Acute Rehab OT Goals Patient Stated Goal: to return home and be independent OT Goal Formulation: With patient Time For Goal Achievement: 11/02/20 Potential to Achieve Goals: Good  Plan Discharge plan remains appropriate;Frequency remains appropriate    Co-evaluation                 AM-PAC OT "6 Clicks" Daily Activity     Outcome Measure   Help from another person eating meals?: None Help from another person taking care of personal grooming?: None Help from another person toileting, which includes using toliet, bedpan, or urinal?:  A Little Help from another person bathing (including washing, rinsing, drying)?: A Little Help from another person to put on and taking off regular upper body clothing?: None Help from another person to put on and taking off regular lower body clothing?: A Lot 6 Click Score: 20    End of Session Equipment Utilized During Treatment: Gait belt;Rolling walker  OT Visit Diagnosis: Unsteadiness on feet (R26.81);Muscle weakness (generalized) (M62.81);Pain Pain - Right/Left:  (bilateral) Pain - part of body: Leg (BLEs and back)   Activity Tolerance Patient tolerated treatment well   Patient Left in chair;with call bell/phone within reach;with nursing/sitter in room   Nurse Communication Mobility status        Time: [id number] OT Time Calculation (min): 30 min  Charges: OT General Charges $OT Visit: 1 Visit OT Treatments $Self Care/Home Management : 23-37 mins Patrick Anthony., COTA/L Acute Rehabilitation Services [place] 10/22/2020, 10:49 AM

## 2020-10-22 NOTE — Progress Notes (Signed)
PT Cancellation Note  Patient Details Name: Patrick Anthony MRN: [id number] DOB: 1970/09/21   Cancelled Treatment:    Reason Eval/Treat Not Completed: Patient at procedure or test/unavailable.  Pt in HD all afternoon. 10/22/2020  Ginger Carne., PT Acute Rehabilitation Services [phone]  (pager) [phone]  (office)   Tessie Fass Jalayla Chrismer 10/22/2020, 5:52 PM

## 2020-10-23 DIAGNOSIS — I6310 Cerebral infarction due to embolism of unspecified precerebral artery: Secondary | ICD-10-CM | POA: Diagnosis not present

## 2020-10-23 DIAGNOSIS — I5032 Chronic diastolic (congestive) heart failure: Secondary | ICD-10-CM | POA: Diagnosis not present

## 2020-10-23 DIAGNOSIS — I10 Essential (primary) hypertension: Secondary | ICD-10-CM | POA: Diagnosis not present

## 2020-10-23 DIAGNOSIS — I5042 Chronic combined systolic (congestive) and diastolic (congestive) heart failure: Secondary | ICD-10-CM | POA: Diagnosis not present

## 2020-10-23 DIAGNOSIS — R0602 Shortness of breath: Secondary | ICD-10-CM | POA: Diagnosis not present

## 2020-10-23 DIAGNOSIS — Z992 Dependence on renal dialysis: Secondary | ICD-10-CM | POA: Diagnosis not present

## 2020-10-23 DIAGNOSIS — I132 Hypertensive heart and chronic kidney disease with heart failure and with stage 5 chronic kidney disease, or end stage renal disease: Secondary | ICD-10-CM | POA: Diagnosis not present

## 2020-10-23 DIAGNOSIS — I639 Cerebral infarction, unspecified: Secondary | ICD-10-CM | POA: Diagnosis not present

## 2020-10-23 DIAGNOSIS — A419 Sepsis, unspecified organism: Secondary | ICD-10-CM | POA: Diagnosis not present

## 2020-10-23 DIAGNOSIS — Z952 Presence of prosthetic heart valve: Secondary | ICD-10-CM | POA: Diagnosis not present

## 2020-10-23 DIAGNOSIS — J189 Pneumonia, unspecified organism: Secondary | ICD-10-CM | POA: Diagnosis not present

## 2020-10-23 DIAGNOSIS — N2581 Secondary hyperparathyroidism of renal origin: Secondary | ICD-10-CM | POA: Diagnosis not present

## 2020-10-23 DIAGNOSIS — J984 Other disorders of lung: Secondary | ICD-10-CM | POA: Diagnosis not present

## 2020-10-23 DIAGNOSIS — B182 Chronic viral hepatitis C: Secondary | ICD-10-CM | POA: Diagnosis not present

## 2020-10-23 DIAGNOSIS — D631 Anemia in chronic kidney disease: Secondary | ICD-10-CM | POA: Diagnosis not present

## 2020-10-23 DIAGNOSIS — L02415 Cutaneous abscess of right lower limb: Secondary | ICD-10-CM | POA: Diagnosis not present

## 2020-10-23 DIAGNOSIS — N186 End stage renal disease: Secondary | ICD-10-CM | POA: Diagnosis not present

## 2020-10-23 DIAGNOSIS — R7881 Bacteremia: Secondary | ICD-10-CM | POA: Diagnosis not present

## 2020-10-23 DIAGNOSIS — I330 Acute and subacute infective endocarditis: Secondary | ICD-10-CM | POA: Diagnosis not present

## 2020-10-23 DIAGNOSIS — R531 Weakness: Secondary | ICD-10-CM | POA: Diagnosis not present

## 2020-10-23 LAB — CBC WITH DIFFERENTIAL/PLATELET
Abs Immature Granulocytes: 0.18 10*3/uL — ABNORMAL HIGH (ref 0.00–0.07)
Basophils Absolute: 0 10*3/uL (ref 0.0–0.1)
Basophils Relative: 0 %
Eosinophils Absolute: 0.3 10*3/uL (ref 0.0–0.5)
Eosinophils Relative: 2 %
HCT: 31.2 % — ABNORMAL LOW (ref 39.0–52.0)
Hemoglobin: 9.8 g/dL — ABNORMAL LOW (ref 13.0–17.0)
Immature Granulocytes: 1 %
Lymphocytes Relative: 10 %
Lymphs Abs: 1.3 10*3/uL (ref 0.7–4.0)
MCH: 28.4 pg (ref 26.0–34.0)
MCHC: 31.4 g/dL (ref 30.0–36.0)
MCV: 90.4 fL (ref 80.0–100.0)
Monocytes Absolute: 1.8 10*3/uL — ABNORMAL HIGH (ref 0.1–1.0)
Monocytes Relative: 14 %
Neutro Abs: 9.1 10*3/uL — ABNORMAL HIGH (ref 1.7–7.7)
Neutrophils Relative %: 73 %
Platelets: 362 10*3/uL (ref 150–400)
RBC: 3.45 MIL/uL — ABNORMAL LOW (ref 4.22–5.81)
RDW: 18.3 % — ABNORMAL HIGH (ref 11.5–15.5)
WBC: 12.7 10*3/uL — ABNORMAL HIGH (ref 4.0–10.5)
nRBC: 0 % (ref 0.0–0.2)

## 2020-10-23 LAB — RENAL FUNCTION PANEL
Albumin: 2.1 g/dL — ABNORMAL LOW (ref 3.5–5.0)
Anion gap: 10 (ref 5–15)
BUN: 21 mg/dL — ABNORMAL HIGH (ref 6–20)
CO2: 27 mmol/L (ref 22–32)
Calcium: 8.1 mg/dL — ABNORMAL LOW (ref 8.9–10.3)
Chloride: 94 mmol/L — ABNORMAL LOW (ref 98–111)
Creatinine, Ser: 4.44 mg/dL — ABNORMAL HIGH (ref 0.61–1.24)
GFR, Estimated: 15 mL/min — ABNORMAL LOW (ref >60)
Glucose, Bld: 96 mg/dL (ref 70–99)
Phosphorus: 3.8 mg/dL (ref 2.5–4.6)
Potassium: 3.9 mmol/L (ref 3.5–5.1)
Sodium: 131 mmol/L — ABNORMAL LOW (ref 135–145)

## 2020-10-23 MED ORDER — HYDROMORPHONE HCL 2 MG PO TABS
2.0000 mg | ORAL_TABLET | ORAL | Status: DC | PRN
Start: 2020-10-23 — End: 2020-11-04
  Administered 2020-10-23 – 2020-11-04 (×32): 2 mg via ORAL
  Filled 2020-10-23 (×33): qty 1

## 2020-10-23 NOTE — Progress Notes (Signed)
Patient ID: Patrick Anthony, male   DOB: 1970/11/11, 50 y.o.   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  Discussed with patient recommendation to proceed with a right above-the-knee amputation as soon as possible.  Patient states he is talked to several family members and would like to talk to a few more before making a decision.  I discussed that I would not wait until after Wednesday to proceed with surgery.  Discussed that this chronic MRSA infection of the tibia will eventually infect his replaced mitral valve.  I discussed that delaying surgery of the above-the-knee amputation on the right is AGAINST MEDICAL ADVICE.

## 2020-10-23 NOTE — Progress Notes (Signed)
[facility] Progress Note   Subjective:   Seen in room, Reports ongoing back and hip pain. Reports recent events have been "depressing." No issues with HD, denies SOB, CP, palpitations, dizziness, and nausea.   Objective Vitals:   10/22/20 2012 10/23/20 0015 10/23/20 0451 10/23/20 0810  BP: 110/90 112/79 (!) 124/102 (!) 129/99  Pulse: 93 89 86 89  Resp: '17 15 14 18  '$ Temp: 98.7 F (37.1 C) 98 F (36.7 C) 98.3 F (36.8 C) 98.4 F (36.9 C)  TempSrc: Oral Oral Oral Oral  SpO2: 95% 96% 97% 100%  Weight:   76.8 kg   Height:       Physical Exam General: WDWN male, in NAD Heart: RRR, no murmur Lungs: CTA anteriorly Abdomen: Soft, non-tender, non-distended, +BS Extremities: No pedal edema bilaterally Dialysis Access: lUE AVG + bruit  Additional Objective Labs: Basic Metabolic Panel: Recent Labs  Lab 10/21/20 0520 10/22/20 0158 10/23/20 0144  NA 132* 132* 131*  K 3.9 3.9 3.9  CL 94* 93* 94*  CO2 '23 24 27  '$ GLUCOSE 103* 97 96  BUN 22* 41* 21*  CREATININE 4.69* 6.31* 4.44*  CALCIUM 8.3* 8.2* 8.1*  PHOS 4.3 5.8* 3.8   Liver Function Tests: Recent Labs  Lab 10/21/20 0520 10/22/20 0158 10/23/20 0144  ALBUMIN 2.1* 2.1* 2.1*   No results for input(s): LIPASE, AMYLASE in the last 168 hours. CBC: Recent Labs  Lab 10/20/20 1937 10/21/20 0520 10/21/20 1101 10/22/20 0158 10/23/20 0144  WBC 14.1* 10.1 12.0* 12.0* 12.7*  NEUTROABS  --  8.1*  --  8.1* 9.1*  HGB 9.1* 9.7* 9.6* 9.6* 9.8*  HCT 29.2* 29.9* 29.6* 30.3* 31.2*  MCV 89.8 88.2 89.7 89.9 90.4  PLT 296 92* 332 313 362   Blood Culture    Component Value Date/Time   SDES TISSUE 10/20/2020 1612   SPECREQUEST RIGHT LEG ABSC SPEC A 10/20/2020 1612   CULT  10/20/2020 1612    FEW STAPHYLOCOCCUS AUREUS SUSCEPTIBILITIES TO FOLLOW CRITICAL RESULT CALLED TO, READ BACK BY AND VERIFIED WITH: RN N.CROSFON AT [id number] ON 10/22/2020 BY T.SAAD Performed at [facility] [address], [place],  [place] [address]    REPTSTATUS PENDING 10/20/2020 1612    Cardiac Enzymes: No results for input(s): CKTOTAL, CKMB, CKMBINDEX, TROPONINI in the last 168 hours. CBG: Recent Labs  Lab 10/16/20 1529 10/16/20 2134 10/17/20 0625 10/17/20 0807 10/17/20 1158  GLUCAP 105* 111* 82 76 84   Iron Studies:  Recent Labs    10/21/20 0850  IRON 23*  TIBC 193*   '@lablastinr3'$ @ Studies/Results: No results found. Medications: . sodium chloride    . sodium chloride    . sodium chloride    . sodium chloride    . vancomycin 500 mg (10/22/20 1627)   . (feeding supplement) PROSource Plus  30 mL Oral BID BM  . aspirin EC  325 mg Oral Daily  . calcium acetate  667 mg Oral TID WC  . Chlorhexidine Gluconate Cloth  6 each Topical Q0600  . Chlorhexidine Gluconate Cloth  6 each Topical Q0600  . cinacalcet  30 mg Oral Q breakfast  . cloNIDine  0.3 mg Transdermal Weekly  . [START ON 10/29/2020] darbepoetin (ARANESP) injection - DIALYSIS  60 mcg Intravenous Q Fri-HD  . doxercalciferol  3 mcg Intravenous Q M,W,F-HD  . enoxaparin (LOVENOX) injection  30 mg Subcutaneous Q48H  . feeding supplement (NEPRO CARB STEADY)  237 mL Oral TID BM  . gabapentin  200 mg  Oral QHS  . influenza vac split quadrivalent PF  0.5 mL Intramuscular Tomorrow-1000  . metoprolol tartrate  50 mg Oral BID  . multivitamin  1 tablet Oral QHS  . pantoprazole  40 mg Oral Daily  . senna-docusate  1 tablet Oral BID  . sodium chloride flush  3 mL Intravenous Q12H    Outpatient Dialysis Orders: [facility] 4h 350/500 new edw here 12- 75kg (was82.5kg)2/2.5 bath LUA AVGHep 5000 -Aranesp 66mgIVq 2wks - last 09/27/20 (Verified order) -Hectorol375m IV qHD    Assessment/Plan: 1. SP min invasive MVR(bioprosthetic)replacement - on 3/10. Doing well from a cardiac standpoint 2. HTN/volume - continues onclonidine patch and metoprolol 25 bid. BP controlled, no volume overload on exam.  3. 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 S/Ptooth extraction3/8/22.PerID to get 6 wks IV vanc w/ new end date of 11/25/20. 4. ESRD - usual HDMWF.Next dialysis Monday 5. Anemiaof CKD-HGB 9- 10 range. S/P 2 units of PRBCs 3/10.Started on aranesp. No IV Fe due to osteomyelitis.  6. Secondary Hyperparathyroidism -Calcium and phosphorus controlled. Continue sensipar, binders, and hectorol 7. Hx Hep C /liver cirrhosis. 8. Anterior shin lesion status post I&D per orthopedics. Pathology showing staph aureus in the tibia and ortho recommending AKA. Pt is understandably depressed about this, plans to discuss further with Dr. DuSharol Givenoday.   SaAnice PaganiniPA-C 10/23/2020, 10:02 AM  [facility] Pager: [phone]

## 2020-10-23 NOTE — Progress Notes (Signed)
Pharmacy Antibiotic Note  Patrick Anthony is a 50 y.o. male admitted on 09/27/2020 with fever, chills, and generalized weakness.  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  Pt has ESRD with HD MWF.  Plans are for 6 weeks of antibiotics with day 1 being post-op day 1 from valve surgery per 3/7 ID note  3/10 > (4/21).  Vancomycin random this week was at goal at 20. WBC 11.3, afebrile. Remains anuric. Last HD 3/18.   Plan: -Continue vancomycin 500 mg IV qHD  -Continue following HD schedule/duration  Height: 6' 0.01" (182.9 cm) Weight: 76.8 kg (169 lb 5 oz) IBW/kg (Calculated) : 77.62  Temp (24hrs), Avg:98.2 F (36.8 C), Min:97.8 F (36.6 C), Max:98.7 F (37.1 C)  Recent Labs  Lab 10/16/20 2252 10/18/20 0148 10/20/20 0148 10/20/20 1937 10/21/20 0520 10/21/20 1101 10/22/20 0158 10/23/20 0144  WBC  --    < > 11.3* 14.1* 10.1 12.0* 12.0* 12.7*  CREATININE  --    < > 6.67* 7.98* 4.69*  --  6.31* 4.44*  VANCORANDOM 29  --  20  --   --   --   --   --    < > = values in this interval not displayed.    Estimated Creatinine Clearance: 21.9 mL/min (A) (by C-G formula based on SCr of 4.44 mg/dL (H)).    No Known Allergies  Antimicrobials this admission: Vanc 2/21>> Cefepime 2/21>>2/22 Flagyl 2/21>>2/22 (2 doses given)  Dose adjustments this admission: 2/28 Pre-HD VR 41 - estimate post HD level 27 Dose held 2/28 and reduced to '750mg'$  IV qHD starting 3/2 3/7 pre HD VR= 29; change vanc to '500mg'$  w/ HD 3/13 Post-HD VR 29 - no additional dose (did get additional dose of vanc around time of surgery) 3/16 Pre-HD VR 20 - continue with 500 IV qHD  Microbiology results: 2/21 blood x 2: MRSA, sens Vanc MIC < 0.5, Septra, TCN, gent MIC < 0.5, Clinda 2/22 BCID: MRSA  2/21 COVID and flu: negative 2/21 HIV: non-reactive 2/24 blood x 2: 1/2 SA 2/27 blood x 2: neg  Mercy Riding, PharmD PGY1 Acute Care Pharmacy Resident Please refer to  [facility] for unit-specific pharmacist

## 2020-10-23 NOTE — Progress Notes (Signed)
Mobility Specialist - Progress Note   10/23/20 1146  Mobility  Activity Ambulated in hall  Level of Assistance Standby assist, set-up cues, supervision of patient - no hands on  Assistive Device Front wheel walker  Distance Ambulated (ft) 470 ft  Mobility Response Tolerated well  Mobility performed by Mobility specialist  $Mobility charge 1 Mobility   Pt endorsed 4/10 R LE pain w/ ambulation, otherwise asx. Pt to recliner after walk.   Pricilla Handler Mobility Specialist Mobility Specialist Phone: [phone]

## 2020-10-23 NOTE — Progress Notes (Signed)
PROGRESS NOTE    Patrick Anthony  [id number] DOB: March 16, 1971 DOA: 09/27/2020 PCP: [name], MD   Chief Complaint  Patient presents with  . 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  Patient noted to have a MRSA bacteremia complicated by embolic CVA felt likely secondary to endocarditis of the mitral valve noted on 2D echo.  Patient had follow-up TEE which showed a large vegetation with no evidence of intra-arterial shunt. ID consulted and following up on blood cultures and patient placed empirically on IV vancomycin.  CT surgery consulted for surgical intervention for mitral valve endocarditis.  Patient also seen in consultation by cardiology.  Patient subsequently underwent minimally invasive mitral valve replacement per CT surgery 10/14/2020. Patient was on CT surgeons service and subsequently transferred to [facility]. -Patient seen in consultation by orthopedics as patient noted to have acute abscess of right leg with calcification between the tibia and fibula and patient for irrigation and debridement of abscess with placement of wound VAC today 10/20/2020, and likely to return to surgery on Friday. Neurology was consulted and following, ID consulted and following, CT surgery following.   Assessment & Plan:   Principal Problem:   Bacterial endocarditis Active Problems:   Chronic combined systolic and diastolic congestive heart failure (HCC)   HTN (hypertension)   Anemia in chronic kidney disease   Hepatitis C   ESRD on dialysis ([place])   End stage renal disease (HCC)   Chest pain   Sepsis ([place])   Pneumonia due to infectious agent   ADPKD  (autosomal dominant polycystic kidney disease)   Sepsis due to pneumonia (HCC)   Lactic acidosis   Cerebral embolism with cerebral infarction   Bacteremia due to Staphylococcus aureus   Mitral regurgitation   Uncontrolled hypertension   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  Currently afebrile. -Patient noted to have positive blood cultures of MRSA 09/27/2020 and 09/30/2020. -Repeat blood cultures 10/03/2020 -. -TEE 10/05/2020 with LAA thrombus in 1.5 cm MV vegetation with negative bubble study. -Status post multiple teeth extraction 10/12/2020. -Status post minimally invasive mitral valve replacement 10/14/2020 and closure of PFO. -ID noted to have reviewed operative note that showed bulky purulent vegetation.  Per ID due to recent fever 10/13/2020, operative finding and poor tissue penetration by vancomycin recommending 6 weeks of antibiotics from 10/14/2020. -Continue IV vancomycin with HD through 11/25/2020. -Outpatient follow-up in ID clinic post discharge (3/24 with Dr. Gale Journey ). -CT surgery following.   2.  Chronic hep C Genotype ordered.  Outpatient follow-up with ID for treatment.  3.  Right tib-fib abscess Patient s/p irrigation and debridement (10/20/2020) per Dr. Sharol Given, orthopedics with probable surgery intervention  as cultures positive for staph aureus and pathology consistent with acute osteomyelitis.   -Orthopedics  recommended right AKA as soon as possible.   -Patient still discussing with his family and contemplating it.   -Continue IV antibiotics of vancomycin.   -Per orthopedics.    4.  End-stage renal disease on hemodialysis -Patient being followed by nephrology and receiving HD during this hospitalization. -Per nephrology.  5.  Hyponatremia -Per nephrology.  6.  Hypertension Continue clonidine patch, metoprolol.  7.  Anemia of chronic disease Status post transfusion 2 units packed red blood cells 10/14/2020. -Anemia panel with iron level of 23, TIBC BC of 193.   -Hemoglobin stable at 9.8.   -Transfusion threshold hemoglobin < 7.  -Follow.        DVT prophylaxis: Lovenox. Code Status: Full Family Communication: Updated patient.  No family at bedside. Disposition:   Status is: Inpatient    Dispo: The patient is from: Home              Anticipated d/c is to: To be determined              Patient currently on IV antibiotics for MRSA endocarditis with embolic CVAs, status post mitral valve replacement,  s/p I&D of tibial abscess (10/20/2020) and patient for further surgical intervention of possible amputation.  Not stable for discharge.     Difficult to place patient undetermined       Consultants:   Orthopedics: Dr. Sharol Given 10/20/2020  Nephrology  Neurology: Dr. Rory Percy 09/28/2020  Infectious disease: Dr. Graylon Good 09/28/2020  Nephrology: Dr.Schertz 09/28/2020  CT surgery Dr. Roxy Manns 10/05/2020  Orthopedics: Dr. Percell Miller 10/19/2020  Procedures:   CT abdomen and pelvis 09/27/2020, 09/29/2020  CT head 09/28/2020  CT chest 09/29/2020  CT tib-fib 10/20/2020 pending  Plain films right tib-fib 10/19/2020  Brain 09/28/2020  MRI angiogram head and neck 09/28/2020  MRI T-spine L-spine 09/30/2020  TEE 10/05/2020  Cardiac catheterization 10/07/2020--Dr. Berry  Upper extremity Dopplers 10/08/2020  Irrigation and debridement right lower extremities per Dr. Sharol Given 10/20/2020  Minimally invasive  mitral valve replacement per Dr. Roxy Manns 10/14/2020  Antimicrobials:  Anti-infectives (From admission, onward)   Start     Dose/Rate Route Frequency Ordered Stop   10/21/20 0600  ceFAZolin (ANCEF) IVPB 2g/100 mL premix        2 g 200 mL/hr over 30 Minutes Intravenous On call to O.R. 10/20/20 1349 10/21/20 0650   10/20/20 2243  vancomycin (VANCOCIN) 500-5 MG/100ML-% IVPB       Note to Pharmacy: Wallace Cullens   : cabinet override      10/20/20 2243 10/20/20 2243   10/20/20 1437  ceFAZolin (ANCEF) 2-4 GM/100ML-% IVPB       Note to Pharmacy: Ladoris Gene   : cabinet override      10/20/20 1437 10/20/20 1612   10/20/20 1200  vancomycin (VANCOREADY) IVPB 500 mg/100 mL        500 mg 100 mL/hr over 60 Minutes Intravenous Every M-W-F (Hemodialysis) 10/20/20 0903     10/18/20 1124  vancomycin variable dose per unstable renal function (pharmacist dosing)  Status:  Discontinued         Does not apply See admin instructions 10/18/20 1124 10/20/20 0903   10/16/20 1600  vancomycin (VANCOREADY) IVPB 500 mg/100 mL  Status:  Discontinued        500 mg 100 mL/hr over 60 Minutes Intravenous Every Sat (Hemodialysis) 10/16/20 1306 10/16/20 1312   10/15/20 1500  vancomycin (VANCOCIN) IVPB 500 mg/100  ml premix  Status:  Discontinued        500 mg 100 mL/hr over 60 Minutes Intravenous Every M-W-F (Hemodialysis) 10/15/20 1357 10/17/20 0942   10/14/20 2130  vancomycin (VANCOCIN) IVPB 1000 mg/200 mL premix  Status:  Discontinued        1,000 mg 200 mL/hr over 60 Minutes Intravenous  Once 10/14/20 1403 10/14/20 1547   10/14/20 2130  vancomycin (VANCOREADY) IVPB 500 mg/100 mL        500 mg 100 mL/hr over 60 Minutes Intravenous  Once 10/14/20 1547 10/14/20 2222   10/14/20 1730  cefUROXime (ZINACEF) 1.5 g in sodium chloride 0.9 % 100 mL IVPB        1.5 g 200 mL/hr over 30 Minutes Intravenous Every 24 hr x 2 10/14/20 1403 10/15/20 1718   10/14/20 1128  vancomycin (VANCOCIN) 1,000 mg in sodium chloride 0.9 % 1,000  mL irrigation  Status:  Discontinued          As needed 10/14/20 1128 10/14/20 1401   10/14/20 0400  vancomycin (VANCOREADY) IVPB 1250 mg/250 mL        1,250 mg 166.7 mL/hr over 90 Minutes Intravenous To Surgery 10/13/20 1039 10/14/20 0910   10/14/20 0400  cefUROXime (ZINACEF) 1.5 g in sodium chloride 0.9 % 100 mL IVPB        1.5 g 200 mL/hr over 30 Minutes Intravenous To Surgery 10/13/20 1039 10/14/20 0835   10/14/20 0400  cefUROXime (ZINACEF) 750 mg in sodium chloride 0.9 % 100 mL IVPB        750 mg 200 mL/hr over 30 Minutes Intravenous To Surgery 10/13/20 1039 10/14/20 1250   10/14/20 0400  vancomycin (VANCOCIN) 1,000 mg in sodium chloride 0.9 % 1,000 mL irrigation  Status:  Discontinued         Irrigation To Surgery 10/13/20 1039 10/14/20 1403   10/13/20 1657  vancomycin (VANCOCIN) 500-5 MG/100ML-% IVPB       Note to Pharmacy: Judieth Keens  : cabinet override      10/13/20 1657 10/13/20 1717   10/12/20 0600  ceFAZolin (ANCEF) IVPB 2g/100 mL premix        2 g 200 mL/hr over 30 Minutes Intravenous On call to O.R. 10/11/20 1841 10/12/20 1315   10/11/20 1415  vancomycin (VANCOCIN) IVPB 500 mg/100 ml premix  Status:  Discontinued        500 mg 100 mL/hr over 60 Minutes Intravenous Every M-W-F (Hemodialysis) 10/11/20 1413 10/14/20 1403   10/11/20 1413  vancomycin (VANCOCIN) 500-5 MG/100ML-% IVPB       Note to Pharmacy: Kalman Shan   : cabinet override      10/11/20 1413 10/12/20 0229   10/11/20 1353  Vancomycin (VANCOCIN) 750-5 MG/150ML-% IVPB  Status:  Discontinued       Note to Pharmacy: Kalman Shan   : cabinet override      10/11/20 1353 10/11/20 1417   10/08/20 1200  vancomycin (VANCOCIN) IVPB 750 mg/150 ml premix  Status:  Discontinued        750 mg 150 mL/hr over 60 Minutes Intravenous Every M-W-F (Hemodialysis) 10/05/20 1404 10/11/20 1413   10/06/20 1619  vancomycin (VANCOCIN) 500-5 MG/100ML-% IVPB       Note to Pharmacy: Judieth Keens  : cabinet override       10/06/20 1619 10/06/20 1736   10/06/20 1200  vancomycin (VANCOCIN) IVPB 750 mg/150 ml premix  Status:  Discontinued        750 mg 150 mL/hr over  60 Minutes Intravenous Every M-W-F (Hemodialysis) 10/04/20 0905 10/05/20 1404   10/06/20 1200  vancomycin (VANCOCIN) IVPB 500 mg/100 ml premix        500 mg 100 mL/hr over 60 Minutes Intravenous Every Wed (Hemodialysis) 10/05/20 1404 10/06/20 2000   10/01/20 2000  DAPTOmycin (CUBICIN) 840 mg in sodium chloride 0.9 % IVPB  Status:  Discontinued        840 mg 233.6 mL/hr over 30 Minutes Intravenous Every 48 hours 10/01/20 0922 10/01/20 1056   10/01/20 1200  vancomycin (VANCOCIN) IVPB 1000 mg/200 mL premix  Status:  Discontinued        1,000 mg 200 mL/hr over 60 Minutes Intravenous Every M-W-F (Hemodialysis) 10/01/20 1056 10/04/20 0845   09/29/20 1200  vancomycin (VANCOCIN) IVPB 1000 mg/200 mL premix  Status:  Discontinued        1,000 mg 200 mL/hr over 60 Minutes Intravenous Every M-W-F (Hemodialysis) 09/27/20 1443 10/01/20 0922   09/29/20 1200  ceFEPIme (MAXIPIME) 2 g in sodium chloride 0.9 % 100 mL IVPB  Status:  Discontinued        2 g 200 mL/hr over 30 Minutes Intravenous Every M-W-F (Hemodialysis) 09/27/20 1443 09/28/20 1054   09/28/20 0900  metroNIDAZOLE (FLAGYL) IVPB 500 mg  Status:  Discontinued        500 mg 100 mL/hr over 60 Minutes Intravenous Every 12 hours 09/27/20 1808 09/28/20 1054   09/28/20 0900  ceFEPIme (MAXIPIME) 1 g in sodium chloride 0.9 % 100 mL IVPB  Status:  Discontinued        1 g 200 mL/hr over 30 Minutes Intravenous Every 12 hours 09/27/20 1808 09/27/20 1812   09/27/20 1445  vancomycin (VANCOREADY) IVPB 2000 mg/400 mL        2,000 mg 200 mL/hr over 120 Minutes Intravenous  Once 09/27/20 1433 09/27/20 2330   09/27/20 1430  ceFEPIme (MAXIPIME) 2 g in sodium chloride 0.9 % 100 mL IVPB        2 g 200 mL/hr over 30 Minutes Intravenous  Once 09/27/20 1418 09/27/20 1541   09/27/20 1430  metroNIDAZOLE (FLAGYL) IVPB 500 mg         500 mg 100 mL/hr over 60 Minutes Intravenous  Once 09/27/20 1418 09/27/20 2214   09/27/20 1430  vancomycin (VANCOREADY) IVPB 1750 mg/350 mL  Status:  Discontinued        1,750 mg 175 mL/hr over 120 Minutes Intravenous  Once 09/27/20 1418 09/27/20 1433       Subjective: Sitting up in bed.  Patient complains of some upper chest numbness which has not worsened since surgery.  Some intermittent shortness of breath.  Overall feeling better.  No abdominal pain.  Still with right lower extremity pain with some improvement with oral Dilaudid however still with some significant pain.    Objective: Vitals:   10/22/20 2012 10/23/20 0015 10/23/20 0451 10/23/20 0810  BP: 110/90 112/79 (!) 124/102 (!) 129/99  Pulse: 93 89 86 89  Resp: '17 15 14 18  '$ Temp: 98.7 F (37.1 C) 98 F (36.7 C) 98.3 F (36.8 C) 98.4 F (36.9 C)  TempSrc: Oral Oral Oral Oral  SpO2: 95% 96% 97% 100%  Weight:   76.8 kg   Height:        Intake/Output Summary (Last 24 hours) at 10/23/2020 1035 Last data filed at 10/23/2020 0015 Gross per 24 hour  Intake 120 ml  Output 3000 ml  Net -2880 ml   Filed Weights   10/22/20 1402 10/22/20 1740  10/23/20 0451  Weight: 77.7 kg 74.5 kg 76.8 kg    Examination:  General exam: NAD. Respiratory system: Some bibasilar crackles.  No wheezing.  No rhonchi.  Fair air movement.  Speaking in full sentences.  Cardiovascular system: Regular rate rhythm no murmurs rubs or gallops.  No JVD.  No lower extremity edema.  Gastrointestinal system: Abdomen is soft, nontender, nondistended, positive bowel sounds.  No rebound.  No guarding.  Central nervous system: Alert and oriented. No focal neurological deficits. Extremities: Right lower extremity with postop bandage.  Some tenderness to palpation.   Skin: No rashes, lesions or ulcers Psychiatry: Judgement and insight appear normal. Mood & affect appropriate.     Data Reviewed: I have personally reviewed following labs and imaging  studies  CBC: Recent Labs  Lab 10/20/20 0148 10/20/20 1937 10/21/20 0520 10/21/20 1101 10/22/20 0158 10/23/20 0144  WBC 11.3* 14.1* 10.1 12.0* 12.0* 12.7*  NEUTROABS 7.6  --  8.1*  --  8.1* 9.1*  HGB 9.2* 9.1* 9.7* 9.6* 9.6* 9.8*  HCT 28.6* 29.2* 29.9* 29.6* 30.3* 31.2*  MCV 89.9 89.8 88.2 89.7 89.9 90.4  PLT 243 296 92* 332 313 [id number]    Basic Metabolic Panel: Recent Labs  Lab 10/20/20 0148 10/20/20 1937 10/21/20 0520 10/22/20 0158 10/23/20 0144  NA 130* 126* 132* 132* 131*  K 3.9 5.0 3.9 3.9 3.9  CL 93* 90* 94* 93* 94*  CO2 '24 23 23 24 27  '$ GLUCOSE 92 121* 103* 97 96  BUN 37* 45* 22* 41* 21*  CREATININE 6.67* 7.98* 4.69* 6.31* 4.44*  CALCIUM 8.6* 8.3* 8.3* 8.2* 8.1*  PHOS 5.1* 5.5* 4.3 5.8* 3.8    GFR: Estimated Creatinine Clearance: 21.9 mL/min (A) (by C-G formula based on SCr of 4.44 mg/dL (H)).  Liver Function Tests: Recent Labs  Lab 10/20/20 0148 10/20/20 1937 10/21/20 0520 10/22/20 0158 10/23/20 0144  ALBUMIN 2.1* 2.2* 2.1* 2.1* 2.1*    CBG: Recent Labs  Lab 10/16/20 1529 10/16/20 2134 10/17/20 0625 10/17/20 0807 10/17/20 1158  GLUCAP 105* 111* 82 76 84     Recent Results (from the past 240 hour(s))  Culture, blood (routine x 2)     Status: None   Collection Time: 10/13/20 10:43 PM   Specimen: BLOOD  Result Value Ref Range Status   Specimen Description BLOOD RIGHT THUMB  Final   Special Requests   Final    BOTTLES DRAWN AEROBIC AND ANAEROBIC Blood Culture adequate volume   Culture   Final    NO GROWTH 5 DAYS Performed at [facility], [address], [place], [place] [address]    Report Status 10/19/2020 FINAL  Final  Culture, blood (routine x 2)     Status: None   Collection Time: 10/13/20 10:43 PM   Specimen: BLOOD RIGHT HAND  Result Value Ref Range Status   Specimen Description BLOOD RIGHT HAND  Final   Special Requests   Final    BOTTLES DRAWN AEROBIC AND ANAEROBIC Blood Culture adequate volume   Culture   Final    NO  GROWTH 5 DAYS Performed at [facility], [place] [address], [place], [place] [address]    Report Status 10/19/2020 FINAL  Final  Aerobic/Anaerobic Culture w Gram Stain (surgical/deep wound)     Status: None   Collection Time: 10/14/20 10:38 AM   Specimen: Soft Tissue, Other  Result Value Ref Range Status   Specimen Description TISSUE MITRAL VALVE VEGETATION SPEC A  Final   Special  Requests MITRAL VALVE VEGETATION  Final   Gram Stain   Final    WBC PRESENT,BOTH PMN AND MONONUCLEAR NO ORGANISMS SEEN    Culture   Final    No growth aerobically or anaerobically. Performed at [facility], [place] [address] [place], [place] [address]    Report Status 10/19/2020 FINAL  Final  Aerobic/Anaerobic Culture w Gram Stain (surgical/deep wound)     Status: None (Preliminary result)   Collection Time: 10/20/20  4:12 PM   Specimen: Soft Tissue, Other  Result Value Ref Range Status   Specimen Description TISSUE  Final   Special Requests RIGHT LEG ABSC SPEC A  Final   Gram Stain   Final    RARE WBC PRESENT,BOTH PMN AND MONONUCLEAR NO ORGANISMS SEEN    Culture   Final    FEW STAPHYLOCOCCUS AUREUS SUSCEPTIBILITIES TO FOLLOW CRITICAL RESULT CALLED TO, READ BACK BY AND VERIFIED WITH: RN [email or web address] AT LC:[id number] ON 10/22/2020 BY T.SAAD Performed at [facility], [place] [address] [place], [place] [address]    Report Status PENDING  Incomplete         Radiology Studies: No results found.      Scheduled Meds: . (feeding supplement) PROSource Plus  30 mL Oral BID BM  . aspirin EC  325 mg Oral Daily  . calcium acetate  667 mg Oral TID WC  . Chlorhexidine Gluconate Cloth  6 each Topical Q0600  . Chlorhexidine Gluconate Cloth  6 each Topical Q0600  . cinacalcet  30 mg Oral Q breakfast  . cloNIDine  0.3 mg Transdermal Weekly  . [START ON 10/29/2020] darbepoetin (ARANESP) injection - DIALYSIS  60 mcg Intravenous Q Fri-HD  . doxercalciferol  3 mcg Intravenous Q M,W,F-HD  . enoxaparin  (LOVENOX) injection  30 mg Subcutaneous Q48H  . feeding supplement (NEPRO CARB STEADY)  237 mL Oral TID BM  . gabapentin  200 mg Oral QHS  . influenza vac split quadrivalent PF  0.5 mL Intramuscular Tomorrow-1000  . metoprolol tartrate  50 mg Oral BID  . multivitamin  1 tablet Oral QHS  . pantoprazole  40 mg Oral Daily  . senna-docusate  1 tablet Oral BID  . sodium chloride flush  3 mL Intravenous Q12H   Continuous Infusions: . sodium chloride    . sodium chloride    . sodium chloride    . sodium chloride    . vancomycin 500 mg (10/22/20 1627)     LOS: 26 days    Time spent: 40 minutes    Irine Seal, MD [facility]   To contact the attending provider between 7A-7P or the covering provider during after hours 7P-7A, please log into the web site [email or web address] and access using universal [place] password for that web site. If you do not have the password, please call the hospital operator.  10/23/2020, 10:35 AM

## 2020-10-24 DIAGNOSIS — J189 Pneumonia, unspecified organism: Secondary | ICD-10-CM | POA: Diagnosis not present

## 2020-10-24 DIAGNOSIS — Z952 Presence of prosthetic heart valve: Secondary | ICD-10-CM | POA: Diagnosis not present

## 2020-10-24 DIAGNOSIS — R531 Weakness: Secondary | ICD-10-CM | POA: Diagnosis not present

## 2020-10-24 DIAGNOSIS — D631 Anemia in chronic kidney disease: Secondary | ICD-10-CM | POA: Diagnosis not present

## 2020-10-24 DIAGNOSIS — J984 Other disorders of lung: Secondary | ICD-10-CM | POA: Diagnosis not present

## 2020-10-24 DIAGNOSIS — R7881 Bacteremia: Secondary | ICD-10-CM | POA: Diagnosis not present

## 2020-10-24 DIAGNOSIS — Z992 Dependence on renal dialysis: Secondary | ICD-10-CM | POA: Diagnosis not present

## 2020-10-24 DIAGNOSIS — I6310 Cerebral infarction due to embolism of unspecified precerebral artery: Secondary | ICD-10-CM | POA: Diagnosis not present

## 2020-10-24 DIAGNOSIS — A419 Sepsis, unspecified organism: Secondary | ICD-10-CM | POA: Diagnosis not present

## 2020-10-24 DIAGNOSIS — I330 Acute and subacute infective endocarditis: Secondary | ICD-10-CM | POA: Diagnosis not present

## 2020-10-24 DIAGNOSIS — B182 Chronic viral hepatitis C: Secondary | ICD-10-CM | POA: Diagnosis not present

## 2020-10-24 DIAGNOSIS — L02415 Cutaneous abscess of right lower limb: Secondary | ICD-10-CM | POA: Diagnosis not present

## 2020-10-24 DIAGNOSIS — I639 Cerebral infarction, unspecified: Secondary | ICD-10-CM | POA: Diagnosis not present

## 2020-10-24 DIAGNOSIS — N2581 Secondary hyperparathyroidism of renal origin: Secondary | ICD-10-CM | POA: Diagnosis not present

## 2020-10-24 DIAGNOSIS — I5042 Chronic combined systolic (congestive) and diastolic (congestive) heart failure: Secondary | ICD-10-CM | POA: Diagnosis not present

## 2020-10-24 DIAGNOSIS — I132 Hypertensive heart and chronic kidney disease with heart failure and with stage 5 chronic kidney disease, or end stage renal disease: Secondary | ICD-10-CM | POA: Diagnosis not present

## 2020-10-24 DIAGNOSIS — R0602 Shortness of breath: Secondary | ICD-10-CM | POA: Diagnosis not present

## 2020-10-24 DIAGNOSIS — I10 Essential (primary) hypertension: Secondary | ICD-10-CM | POA: Diagnosis not present

## 2020-10-24 DIAGNOSIS — I5032 Chronic diastolic (congestive) heart failure: Secondary | ICD-10-CM | POA: Diagnosis not present

## 2020-10-24 DIAGNOSIS — N186 End stage renal disease: Secondary | ICD-10-CM | POA: Diagnosis not present

## 2020-10-24 LAB — RENAL FUNCTION PANEL
Albumin: 2.1 g/dL — ABNORMAL LOW (ref 3.5–5.0)
Anion gap: 11 (ref 5–15)
BUN: 38 mg/dL — ABNORMAL HIGH (ref 6–20)
CO2: 25 mmol/L (ref 22–32)
Calcium: 8.4 mg/dL — ABNORMAL LOW (ref 8.9–10.3)
Chloride: 93 mmol/L — ABNORMAL LOW (ref 98–111)
Creatinine, Ser: 6.56 mg/dL — ABNORMAL HIGH (ref 0.61–1.24)
GFR, Estimated: 10 mL/min — ABNORMAL LOW (ref >60)
Glucose, Bld: 109 mg/dL — ABNORMAL HIGH (ref 70–99)
Phosphorus: 4.8 mg/dL — ABNORMAL HIGH (ref 2.5–4.6)
Potassium: 4.3 mmol/L (ref 3.5–5.1)
Sodium: 129 mmol/L — ABNORMAL LOW (ref 135–145)

## 2020-10-24 LAB — CBC WITH DIFFERENTIAL/PLATELET
Abs Immature Granulocytes: 0.15 10*3/uL — ABNORMAL HIGH (ref 0.00–0.07)
Basophils Absolute: 0 10*3/uL (ref 0.0–0.1)
Basophils Relative: 0 %
Eosinophils Absolute: 0.2 10*3/uL (ref 0.0–0.5)
Eosinophils Relative: 2 %
HCT: 29.5 % — ABNORMAL LOW (ref 39.0–52.0)
Hemoglobin: 9.6 g/dL — ABNORMAL LOW (ref 13.0–17.0)
Immature Granulocytes: 1 %
Lymphocytes Relative: 7 %
Lymphs Abs: 0.9 10*3/uL (ref 0.7–4.0)
MCH: 28.7 pg (ref 26.0–34.0)
MCHC: 32.5 g/dL (ref 30.0–36.0)
MCV: 88.1 fL (ref 80.0–100.0)
Monocytes Absolute: 1.9 10*3/uL — ABNORMAL HIGH (ref 0.1–1.0)
Monocytes Relative: 16 %
Neutro Abs: 8.7 10*3/uL — ABNORMAL HIGH (ref 1.7–7.7)
Neutrophils Relative %: 74 %
Platelets: 424 10*3/uL — ABNORMAL HIGH (ref 150–400)
RBC: 3.35 MIL/uL — ABNORMAL LOW (ref 4.22–5.81)
RDW: 18.3 % — ABNORMAL HIGH (ref 11.5–15.5)
WBC: 11.9 10*3/uL — ABNORMAL HIGH (ref 4.0–10.5)
nRBC: 0 % (ref 0.0–0.2)

## 2020-10-24 MED ORDER — OXYMETAZOLINE HCL 0.05 % NA SOLN
1.0000 | Freq: Two times a day (BID) | NASAL | Status: DC
  Administered 2020-10-24 – 2020-11-09 (×12): 1 via NASAL
  Filled 2020-10-24: qty 30

## 2020-10-24 NOTE — Progress Notes (Signed)
Mobility Specialist - Progress Note   10/24/20 1150  Mobility  Activity Ambulated in hall  Level of Assistance Standby assist, set-up cues, supervision of patient - no hands on  Assistive Device Front wheel walker  Distance Ambulated (ft) 470 ft  Mobility Response Tolerated well  Mobility performed by Mobility specialist  $Mobility charge 1 Mobility   Pre-mobility: 86 HR During mobility: 113 HR Post-mobility: 92 HR  Pt asx throughout ambulation. Pt to recliner after walk.   Pricilla Handler Mobility Specialist Mobility Specialist Phone: [phone]

## 2020-10-24 NOTE — Progress Notes (Signed)
PROGRESS NOTE    Patrick Anthony  [id number] DOB: 03/26/1971 DOA: 09/27/2020 PCP: [name], MD   Chief Complaint  Patient presents with  . 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  Patient noted to have a MRSA bacteremia complicated by embolic CVA felt likely secondary to endocarditis of the mitral valve noted on 2D echo.  Patient had follow-up TEE which showed a large vegetation with no evidence of intra-arterial shunt. ID consulted and following up on blood cultures and patient placed empirically on IV vancomycin.  CT surgery consulted for surgical intervention for mitral valve endocarditis.  Patient also seen in consultation by cardiology.  Patient subsequently underwent minimally invasive mitral valve replacement per CT surgery 10/14/2020. Patient was on CT surgeons service and subsequently transferred to [facility]. -Patient seen in consultation by orthopedics as patient noted to have acute abscess of right leg with calcification between the tibia and fibula and patient for irrigation and debridement of abscess with placement of wound VAC today 10/20/2020, and likely to return to surgery on Friday. Neurology was consulted and following, ID consulted and following, CT surgery following.   Assessment & Plan:   Principal Problem:   Bacterial endocarditis Active Problems:   Chronic combined systolic and diastolic congestive heart failure (HCC)   HTN (hypertension)   Anemia in chronic kidney disease   Hepatitis C   ESRD on dialysis ([place])   End stage renal disease (HCC)   Chest pain   Sepsis ([place])   Pneumonia due to infectious agent   ADPKD  (autosomal dominant polycystic kidney disease)   Sepsis due to pneumonia (HCC)   Lactic acidosis   Cerebral embolism with cerebral infarction   Bacteremia due to Staphylococcus aureus   Mitral regurgitation   Uncontrolled hypertension   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  Currently afebrile. -Patient noted to have positive blood cultures of MRSA 09/27/2020 and 09/30/2020. -Repeat blood cultures 10/03/2020 -. -TEE 10/05/2020 with LAA thrombus in 1.5 cm MV vegetation with negative bubble study. -Status post multiple teeth extraction 10/12/2020. -Status post minimally invasive mitral valve replacement 10/14/2020 and closure of PFO. -ID noted to have reviewed operative note that showed bulky purulent vegetation.  Per ID due to recent fever 10/13/2020, operative finding and poor tissue penetration by vancomycin recommending 6 weeks of antibiotics from 10/14/2020. -Continue IV vancomycin with HD through 11/25/2020. -Outpatient follow-up in ID clinic post discharge (3/24 with Dr. Gale Journey ). -CT surgery following.   2.  Chronic hep C Genotype ordered.  Outpatient follow-up with ID for treatment.  3.  Right tib-fib abscess(MRSA)/acute osteomyelitis Patient s/p irrigation and debridement (10/20/2020) per Dr. Sharol Given, orthopedics with probable surgery  intervention as cultures positive for MRSA and pathology consistent with acute osteomyelitis.    -Orthopedics recommended right AKA as soon as possible.   -Patient struggling with the idea of possible right lower extremity amputation stating he is only 50 years old and feels he has more life ahead of him and would like to exhaust all possibilities prior to consideration of amputation at this time.  Wanting insight into treatment of right lower extremity osteomyelitis/MRSA with long-term antibiotics prior to consideration of amputation.  -We will have ID see patient tomorrow to discuss options and other recommendations. -Continue IV antibiotics of vancomycin. -Orthopedics following and appreciate input and recommendations.  4.  End-stage renal disease on hemodialysis -Patient being followed by nephrology and receiving HD during this hospitalization. -Per nephrology.  5.  Hyponatremia -On HD.  Per nephrology.  6.  Hypertension Continue metoprolol, clonidine patch.   7.  Anemia of chronic disease Status post transfusion 2 units packed red blood cells 10/14/2020. -Anemia panel with iron level of 23, TIBC BC of 193.   -Hemoglobin stable at 9.6.Marland Kitchen   -Transfusion threshold hemoglobin < 7.  -Follow.    8.  Nasal bleeding Patient with some nasal bleeding noted this afternoon per RN.  Afrin twice daily.  If no improvement may need nasal packing versus evaluation by ENT      DVT prophylaxis: Lovenox. Code Status: Full Family Communication: Updated patient.  No family at bedside. Disposition:   Status is: Inpatient    Dispo: The patient is from: Home              Anticipated d/c is to: To be determined              Patient currently on IV antibiotics for MRSA endocarditis with embolic CVAs, status post mitral valve replacement,  s/p I&D of tibial abscess (10/20/2020) and patient for further surgical intervention of possible amputation.  Not stable for discharge.     Difficult to place patient undetermined       Consultants:   Orthopedics: Dr. Sharol Given  10/20/2020  Nephrology  Neurology: Dr. Rory Percy 09/28/2020  Infectious disease: Dr. Graylon Good 09/28/2020  Nephrology: Dr.Schertz 09/28/2020  CT surgery Dr. Roxy Manns 10/05/2020  Orthopedics: Dr. Percell Miller 10/19/2020  Procedures:   CT abdomen and pelvis 09/27/2020, 09/29/2020  CT head 09/28/2020  CT chest 09/29/2020  CT tib-fib 10/20/2020  Plain films right tib-fib 10/19/2020  Brain 09/28/2020  MRI angiogram head and neck 09/28/2020  MRI T-spine L-spine 09/30/2020  TEE 10/05/2020  Cardiac catheterization 10/07/2020--Dr. Gwenlyn Found  Upper extremity Dopplers 10/08/2020  Irrigation and debridement right lower extremities per Dr. Sharol Given 10/20/2020  Minimally invasive mitral valve replacement per Dr. Roxy Manns 10/14/2020  Antimicrobials:  Anti-infectives (From admission, onward)   Start     Dose/Rate Route Frequency Ordered Stop   10/21/20 0600  ceFAZolin (ANCEF) IVPB 2g/100 mL premix        2 g 200 mL/hr over 30 Minutes Intravenous On call to O.R. 10/20/20 1349 10/21/20 0650   10/20/20 2243  vancomycin (VANCOCIN) 500-5 MG/100ML-% IVPB       Note to Pharmacy: Wallace Cullens   : cabinet override      10/20/20 2243 10/20/20 2243   10/20/20 1437  ceFAZolin (ANCEF) 2-4 GM/100ML-% IVPB       Note to Pharmacy: Ladoris Gene   : cabinet override      10/20/20 1437 10/20/20 1612   10/20/20 1200  vancomycin (VANCOREADY) IVPB 500 mg/100 mL  500 mg 100 mL/hr over 60 Minutes Intravenous Every M-W-F (Hemodialysis) 10/20/20 0903     10/18/20 1124  vancomycin variable dose per unstable renal function (pharmacist dosing)  Status:  Discontinued         Does not apply See admin instructions 10/18/20 1124 10/20/20 0903   10/16/20 1600  vancomycin (VANCOREADY) IVPB 500 mg/100 mL  Status:  Discontinued        500 mg 100 mL/hr over 60 Minutes Intravenous Every Sat (Hemodialysis) 10/16/20 1306 10/16/20 1312   10/15/20 1500  vancomycin (VANCOCIN) IVPB 500 mg/100 ml premix  Status:  Discontinued        500 mg 100 mL/hr over  60 Minutes Intravenous Every M-W-F (Hemodialysis) 10/15/20 1357 10/17/20 0942   10/14/20 2130  vancomycin (VANCOCIN) IVPB 1000 mg/200 mL premix  Status:  Discontinued        1,000 mg 200 mL/hr over 60 Minutes Intravenous  Once 10/14/20 1403 10/14/20 1547   10/14/20 2130  vancomycin (VANCOREADY) IVPB 500 mg/100 mL        500 mg 100 mL/hr over 60 Minutes Intravenous  Once 10/14/20 1547 10/14/20 2222   10/14/20 1730  cefUROXime (ZINACEF) 1.5 g in sodium chloride 0.9 % 100 mL IVPB        1.5 g 200 mL/hr over 30 Minutes Intravenous Every 24 hr x 2 10/14/20 1403 10/15/20 1718   10/14/20 1128  vancomycin (VANCOCIN) 1,000 mg in sodium chloride 0.9 % 1,000 mL irrigation  Status:  Discontinued          As needed 10/14/20 1128 10/14/20 1401   10/14/20 0400  vancomycin (VANCOREADY) IVPB 1250 mg/250 mL        1,250 mg 166.7 mL/hr over 90 Minutes Intravenous To Surgery 10/13/20 1039 10/14/20 0910   10/14/20 0400  cefUROXime (ZINACEF) 1.5 g in sodium chloride 0.9 % 100 mL IVPB        1.5 g 200 mL/hr over 30 Minutes Intravenous To Surgery 10/13/20 1039 10/14/20 0835   10/14/20 0400  cefUROXime (ZINACEF) 750 mg in sodium chloride 0.9 % 100 mL IVPB        750 mg 200 mL/hr over 30 Minutes Intravenous To Surgery 10/13/20 1039 10/14/20 1250   10/14/20 0400  vancomycin (VANCOCIN) 1,000 mg in sodium chloride 0.9 % 1,000 mL irrigation  Status:  Discontinued         Irrigation To Surgery 10/13/20 1039 10/14/20 1403   10/13/20 1657  vancomycin (VANCOCIN) 500-5 MG/100ML-% IVPB       Note to Pharmacy: Judieth Keens  : cabinet override      10/13/20 1657 10/13/20 1717   10/12/20 0600  ceFAZolin (ANCEF) IVPB 2g/100 mL premix        2 g 200 mL/hr over 30 Minutes Intravenous On call to O.R. 10/11/20 1841 10/12/20 1315   10/11/20 1415  vancomycin (VANCOCIN) IVPB 500 mg/100 ml premix  Status:  Discontinued        500 mg 100 mL/hr over 60 Minutes Intravenous Every M-W-F (Hemodialysis) 10/11/20 1413 10/14/20 1403    10/11/20 1413  vancomycin (VANCOCIN) 500-5 MG/100ML-% IVPB       Note to Pharmacy: Kalman Shan   : cabinet override      10/11/20 1413 10/12/20 0229   10/11/20 1353  Vancomycin (VANCOCIN) 750-5 MG/150ML-% IVPB  Status:  Discontinued       Note to Pharmacy: Kalman Shan   : cabinet override      10/11/20 1353 10/11/20 1417  10/08/20 1200  vancomycin (VANCOCIN) IVPB 750 mg/150 ml premix  Status:  Discontinued        750 mg 150 mL/hr over 60 Minutes Intravenous Every M-W-F (Hemodialysis) 10/05/20 1404 10/11/20 1413   10/06/20 1619  vancomycin (VANCOCIN) 500-5 MG/100ML-% IVPB       Note to Pharmacy: Judieth Keens  : cabinet override      10/06/20 1619 10/06/20 1736   10/06/20 1200  vancomycin (VANCOCIN) IVPB 750 mg/150 ml premix  Status:  Discontinued        750 mg 150 mL/hr over 60 Minutes Intravenous Every M-W-F (Hemodialysis) 10/04/20 0905 10/05/20 1404   10/06/20 1200  vancomycin (VANCOCIN) IVPB 500 mg/100 ml premix        500 mg 100 mL/hr over 60 Minutes Intravenous Every Wed (Hemodialysis) 10/05/20 1404 10/06/20 2000   10/01/20 2000  DAPTOmycin (CUBICIN) 840 mg in sodium chloride 0.9 % IVPB  Status:  Discontinued        840 mg 233.6 mL/hr over 30 Minutes Intravenous Every 48 hours 10/01/20 0922 10/01/20 1056   10/01/20 1200  vancomycin (VANCOCIN) IVPB 1000 mg/200 mL premix  Status:  Discontinued        1,000 mg 200 mL/hr over 60 Minutes Intravenous Every M-W-F (Hemodialysis) 10/01/20 1056 10/04/20 0845   09/29/20 1200  vancomycin (VANCOCIN) IVPB 1000 mg/200 mL premix  Status:  Discontinued        1,000 mg 200 mL/hr over 60 Minutes Intravenous Every M-W-F (Hemodialysis) 09/27/20 1443 10/01/20 0922   09/29/20 1200  ceFEPIme (MAXIPIME) 2 g in sodium chloride 0.9 % 100 mL IVPB  Status:  Discontinued        2 g 200 mL/hr over 30 Minutes Intravenous Every M-W-F (Hemodialysis) 09/27/20 1443 09/28/20 1054   09/28/20 0900  metroNIDAZOLE (FLAGYL) IVPB 500 mg  Status:  Discontinued         500 mg 100 mL/hr over 60 Minutes Intravenous Every 12 hours 09/27/20 1808 09/28/20 1054   09/28/20 0900  ceFEPIme (MAXIPIME) 1 g in sodium chloride 0.9 % 100 mL IVPB  Status:  Discontinued        1 g 200 mL/hr over 30 Minutes Intravenous Every 12 hours 09/27/20 1808 09/27/20 1812   09/27/20 1445  vancomycin (VANCOREADY) IVPB 2000 mg/400 mL        2,000 mg 200 mL/hr over 120 Minutes Intravenous  Once 09/27/20 1433 09/27/20 2330   09/27/20 1430  ceFEPIme (MAXIPIME) 2 g in sodium chloride 0.9 % 100 mL IVPB        2 g 200 mL/hr over 30 Minutes Intravenous  Once 09/27/20 1418 09/27/20 1541   09/27/20 1430  metroNIDAZOLE (FLAGYL) IVPB 500 mg        500 mg 100 mL/hr over 60 Minutes Intravenous  Once 09/27/20 1418 09/27/20 2214   09/27/20 1430  vancomycin (VANCOREADY) IVPB 1750 mg/350 mL  Status:  Discontinued        1,750 mg 175 mL/hr over 120 Minutes Intravenous  Once 09/27/20 1418 09/27/20 1433       Subjective: Patient laying in bed.  Denies any chest pain.  Some intermittent shortness of breath.  No abdominal pain.  States increased dose of Dilaudid helping with right lower extremity pain.   Patient struggling with the idea of possible right lower extremity amputation stating he is only 50 years old and feels he has more life ahead of him and would like to exhaust all possibilities prior to consideration of amputation at this  time.  Wanting insight into treatment of right lower extremity osteomyelitis/MRSA with long-term antibiotics prior to consideration of amputation.   Objective: Vitals:   10/23/20 1940 10/24/20 0005 10/24/20 0242 10/24/20 0806  BP: (!) 146/95 (!) 127/99 (!) 139/100 (!) 139/106  Pulse: 89 91 93 88  Resp: '18 18 19 19  '$ Temp: 98.1 F (36.7 C) 98.5 F (36.9 C) 98.4 F (36.9 C) 98.4 F (36.9 C)  TempSrc: Oral Oral Oral Oral  SpO2: 96% 98% 95% 100%  Weight:      Height:        Intake/Output Summary (Last 24 hours) at 10/24/2020 0953 Last data filed at 10/23/2020  2146 Gross per 24 hour  Intake 571.45 ml  Output --  Net 571.45 ml   Filed Weights   10/22/20 1402 10/22/20 1740 10/23/20 0451  Weight: 77.7 kg 74.5 kg 76.8 kg    Examination:  General exam: NAD. Respiratory system: Some bibasilar crackles.  No wheezing.  No rhonchi.  Fair air movement.  Speaking in full sentences.  Cardiovascular system: Regular rate rhythm no murmurs rubs or gallops.  No JVD.  No lower extremity edema.  Gastrointestinal system: Abdomen is soft, nontender, nondistended, positive bowel sounds.  No rebound.  No guarding.  Central nervous system: Alert and oriented. No focal neurological deficits. Extremities: Right lower extremity with postop bandage.  Some tenderness to palpation.   Skin: No rashes, lesions or ulcers Psychiatry: Judgement and insight appear normal. Mood & affect appropriate.     Data Reviewed: I have personally reviewed following labs and imaging studies  CBC: Recent Labs  Lab 10/20/20 0148 10/20/20 1937 10/21/20 0520 10/21/20 1101 10/22/20 0158 10/23/20 0144 10/24/20 0341  WBC 11.3*   < > 10.1 12.0* 12.0* 12.7* 11.9*  NEUTROABS 7.6  --  8.1*  --  8.1* 9.1* 8.7*  HGB 9.2*   < > 9.7* 9.6* 9.6* 9.8* 9.6*  HCT 28.6*   < > 29.9* 29.6* 30.3* 31.2* 29.5*  MCV 89.9   < > 88.2 89.7 89.9 90.4 88.1  PLT 243   < > 92* 332 313 362 424*   < > = values in this interval not displayed.    Basic Metabolic Panel: Recent Labs  Lab 10/20/20 1937 10/21/20 0520 10/22/20 0158 10/23/20 0144 10/24/20 0341  NA 126* 132* 132* 131* 129*  K 5.0 3.9 3.9 3.9 4.3  CL 90* 94* 93* 94* 93*  CO2 '23 23 24 27 25  '$ GLUCOSE 121* 103* 97 96 109*  BUN 45* 22* 41* 21* 38*  CREATININE 7.98* 4.69* 6.31* 4.44* 6.56*  CALCIUM 8.3* 8.3* 8.2* 8.1* 8.4*  PHOS 5.5* 4.3 5.8* 3.8 4.8*    GFR: Estimated Creatinine Clearance: 14.8 mL/min (A) (by C-G formula based on SCr of 6.56 mg/dL (H)).  Liver Function Tests: Recent Labs  Lab 10/20/20 1937 10/21/20 0520  10/22/20 0158 10/23/20 0144 10/24/20 0341  ALBUMIN 2.2* 2.1* 2.1* 2.1* 2.1*    CBG: Recent Labs  Lab 10/17/20 1158  GLUCAP 84     Recent Results (from the past 240 hour(s))  Aerobic/Anaerobic Culture w Gram Stain (surgical/deep wound)     Status: None   Collection Time: 10/14/20 10:38 AM   Specimen: Soft Tissue, Other  Result Value Ref Range Status   Specimen Description TISSUE MITRAL VALVE VEGETATION SPEC A  Final   Special Requests MITRAL VALVE VEGETATION  Final   Gram Stain   Final    WBC PRESENT,BOTH PMN AND MONONUCLEAR NO ORGANISMS SEEN  Culture   Final    No growth aerobically or anaerobically. Performed at [facility], [place] [address], [place], [place] [address]    Report Status 10/19/2020 FINAL  Final  Aerobic/Anaerobic Culture w Gram Stain (surgical/deep wound)     Status: None (Preliminary result)   Collection Time: 10/20/20  4:12 PM   Specimen: Soft Tissue, Other  Result Value Ref Range Status   Specimen Description TISSUE  Final   Special Requests RIGHT LEG ABSC SPEC A  Final   Gram Stain   Final    RARE WBC PRESENT,BOTH PMN AND MONONUCLEAR NO ORGANISMS SEEN Performed at [facility], [place] [address], [place], [place] [address]    Culture   Final    FEW METHICILLIN RESISTANT STAPHYLOCOCCUS AUREUS CRITICAL RESULT CALLED TO, READ BACK BY AND VERIFIED WITH: RN N.CROSFON AT 0954 ON 10/22/2020 BY T.SAAD NO ANAEROBES ISOLATED; CULTURE IN PROGRESS FOR 5 DAYS    Report Status PENDING  Incomplete   Organism ID, Bacteria METHICILLIN RESISTANT STAPHYLOCOCCUS AUREUS  Final      Susceptibility   Methicillin resistant staphylococcus aureus - MIC*    CIPROFLOXACIN >=8 RESISTANT Resistant     ERYTHROMYCIN >=8 RESISTANT Resistant     GENTAMICIN <=0.5 SENSITIVE Sensitive     OXACILLIN >=4 RESISTANT Resistant     TETRACYCLINE <=1 SENSITIVE Sensitive     VANCOMYCIN 1 SENSITIVE Sensitive     TRIMETH/SULFA <=10 SENSITIVE Sensitive     CLINDAMYCIN <=0.25  SENSITIVE Sensitive     RIFAMPIN <=0.5 SENSITIVE Sensitive     Inducible Clindamycin NEGATIVE Sensitive     * FEW METHICILLIN RESISTANT STAPHYLOCOCCUS AUREUS         Radiology Studies: No results found.      Scheduled Meds: . (feeding supplement) PROSource Plus  30 mL Oral BID BM  . aspirin EC  325 mg Oral Daily  . calcium acetate  667 mg Oral TID WC  . Chlorhexidine Gluconate Cloth  6 each Topical Q0600  . Chlorhexidine Gluconate Cloth  6 each Topical Q0600  . cinacalcet  30 mg Oral Q breakfast  . cloNIDine  0.3 mg Transdermal Weekly  . [START ON 10/29/2020] darbepoetin (ARANESP) injection - DIALYSIS  60 mcg Intravenous Q Fri-HD  . doxercalciferol  3 mcg Intravenous Q M,W,F-HD  . enoxaparin (LOVENOX) injection  30 mg Subcutaneous Q48H  . feeding supplement (NEPRO CARB STEADY)  237 mL Oral TID BM  . gabapentin  200 mg Oral QHS  . influenza vac split quadrivalent PF  0.5 mL Intramuscular Tomorrow-1000  . metoprolol tartrate  50 mg Oral BID  . multivitamin  1 tablet Oral QHS  . pantoprazole  40 mg Oral Daily  . senna-docusate  1 tablet Oral BID  . sodium chloride flush  3 mL Intravenous Q12H   Continuous Infusions: . sodium chloride    . sodium chloride    . sodium chloride    . vancomycin 500 mg (10/22/20 1627)     LOS: 27 days    Time spent: 40 minutes    Irine Seal, MD [facility]   To contact the attending provider between 7A-7P or the covering provider during after hours 7P-7A, please log into the web site [email or web address] and access using universal [place] password for that web site. If you do not have the password, please call the hospital operator.  10/24/2020, 9:53 AM

## 2020-10-24 NOTE — Progress Notes (Signed)
[facility] Progress Note   Subjective:   Seen in room.  Pt expresses to me today that he does not want to undergo amputation at this time.  He would like a second opinion.    Objective Vitals:   10/23/20 1940 10/24/20 0005 10/24/20 0242 10/24/20 0806  BP: (!) 146/95 (!) 127/99 (!) 139/100 (!) 139/106  Pulse: 89 91 93 88  Resp: '18 18 19 19  '$ Temp: 98.1 F (36.7 C) 98.5 F (36.9 C) 98.4 F (36.9 C) 98.4 F (36.9 C)  TempSrc: Oral Oral Oral Oral  SpO2: 96% 98% 95% 100%  Weight:      Height:       Physical Exam General: WDWN male, in NAD Heart: RRR, no murmur Lungs: CTA anteriorly Abdomen: Soft, non-tender, non-distended, +BS Extremities: No pedal edema bilaterally.  R leg dressed Dialysis Access: lUE AVG + bruit  Additional Objective Labs: Basic Metabolic Panel: Recent Labs  Lab 10/22/20 0158 10/23/20 0144 10/24/20 0341  NA 132* 131* 129*  K 3.9 3.9 4.3  CL 93* 94* 93*  CO2 '24 27 25  '$ GLUCOSE 97 96 109*  BUN 41* 21* 38*  CREATININE 6.31* 4.44* 6.56*  CALCIUM 8.2* 8.1* 8.4*  PHOS 5.8* 3.8 4.8*   Liver Function Tests: Recent Labs  Lab 10/22/20 0158 10/23/20 0144 10/24/20 0341  ALBUMIN 2.1* 2.1* 2.1*   No results for input(s): LIPASE, AMYLASE in the last 168 hours. CBC: Recent Labs  Lab 10/21/20 0520 10/21/20 1101 10/22/20 0158 10/23/20 0144 10/24/20 0341  WBC 10.1 12.0* 12.0* 12.7* 11.9*  NEUTROABS 8.1*  --  8.1* 9.1* 8.7*  HGB 9.7* 9.6* 9.6* 9.8* 9.6*  HCT 29.9* 29.6* 30.3* 31.2* 29.5*  MCV 88.2 89.7 89.9 90.4 88.1  PLT 92* 332 313 362 424*   Blood Culture    Component Value Date/Time   SDES TISSUE 10/20/2020 1612   SPECREQUEST RIGHT LEG ABSC SPEC A 10/20/2020 1612   CULT  10/20/2020 1612    FEW METHICILLIN RESISTANT STAPHYLOCOCCUS AUREUS CRITICAL RESULT CALLED TO, READ BACK BY AND VERIFIED WITH: RN N.CROSFON AT 0954 ON 10/22/2020 BY T.SAAD NO ANAEROBES ISOLATED; CULTURE IN PROGRESS FOR 5 DAYS    REPTSTATUS PENDING  10/20/2020 1612    Cardiac Enzymes: No results for input(s): CKTOTAL, CKMB, CKMBINDEX, TROPONINI in the last 168 hours. CBG: Recent Labs  Lab 10/17/20 1158  GLUCAP 84   Iron Studies:  No results for input(s): IRON, TIBC, TRANSFERRIN, FERRITIN in the last 72 hours. '@lablastinr3'$ @ Studies/Results: No results found. Medications: . sodium chloride    . sodium chloride    . sodium chloride    . vancomycin 500 mg (10/22/20 1627)   . (feeding supplement) PROSource Plus  30 mL Oral BID BM  . aspirin EC  325 mg Oral Daily  . calcium acetate  667 mg Oral TID WC  . Chlorhexidine Gluconate Cloth  6 each Topical Q0600  . Chlorhexidine Gluconate Cloth  6 each Topical Q0600  . cinacalcet  30 mg Oral Q breakfast  . cloNIDine  0.3 mg Transdermal Weekly  . [START ON 10/29/2020] darbepoetin (ARANESP) injection - DIALYSIS  60 mcg Intravenous Q Fri-HD  . doxercalciferol  3 mcg Intravenous Q M,W,F-HD  . enoxaparin (LOVENOX) injection  30 mg Subcutaneous Q48H  . feeding supplement (NEPRO CARB STEADY)  237 mL Oral TID BM  . gabapentin  200 mg Oral QHS  . influenza vac split quadrivalent PF  0.5 mL Intramuscular Tomorrow-1000  . metoprolol tartrate  50  mg Oral BID  . multivitamin  1 tablet Oral QHS  . pantoprazole  40 mg Oral Daily  . senna-docusate  1 tablet Oral BID  . sodium chloride flush  3 mL Intravenous Q12H    Outpatient Dialysis Orders: [facility] 4h 350/500 new edw here 71- 75kg (was82.5kg)2/2.5 bath LUA AVGHep 5000 -Aranesp 36mgIVq 2wks - last 09/27/20 (Verified order) -Hectorol397m IV qHD    Assessment/Plan: 1. SP min invasive MVR(bioprosthetic)replacement - on 3/10. Doing well from a cardiac standpoint 2. HTN/volume - continues onclonidine patch and metoprolol 25 bid. BP controlled, no volume overload on exam but Na down- will go for healthy UF goal tomorrow 3. 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  S/Ptooth extraction3/8/22.PerID to get 6 wks IV vanc w/ new end date of 11/25/20. 4. ESRD - usual HDMWF.Next dialysis Monday 5. Anemiaof CKD-HGB 9- 10 range. S/P 2 units of PRBCs 3/10.Started on aranesp. No IV Fe due to osteomyelitis.  6. Secondary Hyperparathyroidism -Calcium and phosphorus controlled. Continue sensipar, binders, and hectorol 7. Hx Hep C /liver cirrhosis. 8. Anterior shin osteomyelitis: status post I&D per orthopedics. Pathology showing staph aureus in the tibia and ortho recommending AKA.  Pt is expressing to me today he wants a second opinion re: recommendation on amputation--> we discussed how this infection, if not controlled, will eventually spread to his bloodstream again and cause the same problems that he came in here with.  I encouraged him to think more about this decision. He tells me that he is really struggling with the fact that he may lose his leg in the "prime of his life".  [place] LipsD 10/24/2020, 9:58 AM  [facility] Pager: [phone]

## 2020-10-25 DIAGNOSIS — M86461 Chronic osteomyelitis with draining sinus, right tibia and fibula: Secondary | ICD-10-CM | POA: Diagnosis not present

## 2020-10-25 DIAGNOSIS — N2581 Secondary hyperparathyroidism of renal origin: Secondary | ICD-10-CM | POA: Diagnosis not present

## 2020-10-25 DIAGNOSIS — J984 Other disorders of lung: Secondary | ICD-10-CM | POA: Diagnosis not present

## 2020-10-25 DIAGNOSIS — Z9889 Other specified postprocedural states: Secondary | ICD-10-CM | POA: Diagnosis not present

## 2020-10-25 DIAGNOSIS — L02415 Cutaneous abscess of right lower limb: Secondary | ICD-10-CM | POA: Diagnosis not present

## 2020-10-25 DIAGNOSIS — R079 Chest pain, unspecified: Secondary | ICD-10-CM

## 2020-10-25 DIAGNOSIS — A419 Sepsis, unspecified organism: Secondary | ICD-10-CM | POA: Diagnosis not present

## 2020-10-25 DIAGNOSIS — Z953 Presence of xenogenic heart valve: Secondary | ICD-10-CM | POA: Diagnosis not present

## 2020-10-25 DIAGNOSIS — D631 Anemia in chronic kidney disease: Secondary | ICD-10-CM | POA: Diagnosis not present

## 2020-10-25 DIAGNOSIS — Z952 Presence of prosthetic heart valve: Secondary | ICD-10-CM | POA: Diagnosis not present

## 2020-10-25 DIAGNOSIS — I6310 Cerebral infarction due to embolism of unspecified precerebral artery: Secondary | ICD-10-CM | POA: Diagnosis not present

## 2020-10-25 DIAGNOSIS — I10 Essential (primary) hypertension: Secondary | ICD-10-CM | POA: Diagnosis not present

## 2020-10-25 DIAGNOSIS — I639 Cerebral infarction, unspecified: Secondary | ICD-10-CM | POA: Diagnosis not present

## 2020-10-25 DIAGNOSIS — R7881 Bacteremia: Secondary | ICD-10-CM | POA: Diagnosis not present

## 2020-10-25 DIAGNOSIS — B9561 Methicillin susceptible Staphylococcus aureus infection as the cause of diseases classified elsewhere: Secondary | ICD-10-CM | POA: Diagnosis not present

## 2020-10-25 DIAGNOSIS — I5032 Chronic diastolic (congestive) heart failure: Secondary | ICD-10-CM | POA: Diagnosis not present

## 2020-10-25 DIAGNOSIS — I330 Acute and subacute infective endocarditis: Secondary | ICD-10-CM | POA: Diagnosis not present

## 2020-10-25 DIAGNOSIS — B182 Chronic viral hepatitis C: Secondary | ICD-10-CM

## 2020-10-25 DIAGNOSIS — I340 Nonrheumatic mitral (valve) insufficiency: Secondary | ICD-10-CM

## 2020-10-25 DIAGNOSIS — N186 End stage renal disease: Secondary | ICD-10-CM | POA: Diagnosis not present

## 2020-10-25 DIAGNOSIS — I5042 Chronic combined systolic (congestive) and diastolic (congestive) heart failure: Secondary | ICD-10-CM | POA: Diagnosis not present

## 2020-10-25 DIAGNOSIS — I132 Hypertensive heart and chronic kidney disease with heart failure and with stage 5 chronic kidney disease, or end stage renal disease: Secondary | ICD-10-CM | POA: Diagnosis not present

## 2020-10-25 DIAGNOSIS — Z992 Dependence on renal dialysis: Secondary | ICD-10-CM | POA: Diagnosis not present

## 2020-10-25 LAB — FUNGUS STAIN

## 2020-10-25 LAB — RENAL FUNCTION PANEL
Albumin: 2 g/dL — ABNORMAL LOW (ref 3.5–5.0)
Anion gap: 12 (ref 5–15)
BUN: 48 mg/dL — ABNORMAL HIGH (ref 6–20)
CO2: 26 mmol/L (ref 22–32)
Calcium: 8.2 mg/dL — ABNORMAL LOW (ref 8.9–10.3)
Chloride: 92 mmol/L — ABNORMAL LOW (ref 98–111)
Creatinine, Ser: 8.37 mg/dL — ABNORMAL HIGH (ref 0.61–1.24)
GFR, Estimated: 7 mL/min — ABNORMAL LOW (ref >60)
Glucose, Bld: 97 mg/dL (ref 70–99)
Phosphorus: 5.2 mg/dL — ABNORMAL HIGH (ref 2.5–4.6)
Potassium: 4.3 mmol/L (ref 3.5–5.1)
Sodium: 130 mmol/L — ABNORMAL LOW (ref 135–145)

## 2020-10-25 LAB — CBC WITH DIFFERENTIAL/PLATELET
Abs Immature Granulocytes: 0.13 10*3/uL — ABNORMAL HIGH (ref 0.00–0.07)
Basophils Absolute: 0 10*3/uL (ref 0.0–0.1)
Basophils Relative: 0 %
Eosinophils Absolute: 0.2 10*3/uL (ref 0.0–0.5)
Eosinophils Relative: 2 %
HCT: 27.9 % — ABNORMAL LOW (ref 39.0–52.0)
Hemoglobin: 8.9 g/dL — ABNORMAL LOW (ref 13.0–17.0)
Immature Granulocytes: 1 %
Lymphocytes Relative: 11 %
Lymphs Abs: 1.4 10*3/uL (ref 0.7–4.0)
MCH: 28.4 pg (ref 26.0–34.0)
MCHC: 31.9 g/dL (ref 30.0–36.0)
MCV: 89.1 fL (ref 80.0–100.0)
Monocytes Absolute: 1.8 10*3/uL — ABNORMAL HIGH (ref 0.1–1.0)
Monocytes Relative: 14 %
Neutro Abs: 8.9 10*3/uL — ABNORMAL HIGH (ref 1.7–7.7)
Neutrophils Relative %: 72 %
Platelets: 414 10*3/uL — ABNORMAL HIGH (ref 150–400)
RBC: 3.13 MIL/uL — ABNORMAL LOW (ref 4.22–5.81)
RDW: 18.5 % — ABNORMAL HIGH (ref 11.5–15.5)
WBC: 12.5 10*3/uL — ABNORMAL HIGH (ref 4.0–10.5)
nRBC: 0 % (ref 0.0–0.2)

## 2020-10-25 LAB — FUNGAL STAIN REFLEX

## 2020-10-25 MED ORDER — HYDROMORPHONE HCL 2 MG PO TABS
ORAL_TABLET | ORAL | Status: AC
  Filled 2020-10-25: qty 1

## 2020-10-25 MED ORDER — VANCOMYCIN HCL IN DEXTROSE 500-5 MG/100ML-% IV SOLN
INTRAVENOUS | Status: AC
  Administered 2020-10-25: 500 mg
  Filled 2020-10-25: qty 100

## 2020-10-25 MED ORDER — DOXERCALCIFEROL 4 MCG/2ML IV SOLN
INTRAVENOUS | Status: AC
  Filled 2020-10-25: qty 2

## 2020-10-25 NOTE — Progress Notes (Signed)
OT Cancellation Note  Patient Details Name: Patrick Anthony MRN: [id number] DOB: 11-Dec-1970   Cancelled Treatment:    Reason Eval/Treat Not Completed: Other (comment) pt reports wanting to visit with his visitor, declining OT session. Will check back as time allows for OT session.  Corinne Ports K., COTA/L Acute Rehabilitation Services [phone]   [name] 10/25/2020, 3:07 PM

## 2020-10-25 NOTE — Progress Notes (Signed)
PROGRESS NOTE    Patrick Anthony  [id number] DOB: February 12, 1971 DOA: 09/27/2020 PCP: [name], MD   Chief Complaint  Patient presents with  . 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  Patient noted to have a MRSA bacteremia complicated by embolic CVA felt likely secondary to endocarditis of the mitral valve noted on 2D echo.  Patient had follow-up TEE which showed a large vegetation with no evidence of intra-arterial shunt. ID consulted and following up on blood cultures and patient placed empirically on IV vancomycin.  CT surgery consulted for surgical intervention for mitral valve endocarditis.  Patient also seen in consultation by cardiology.  Patient subsequently underwent minimally invasive mitral valve replacement per CT surgery 10/14/2020. Patient was on CT surgeons service and subsequently transferred to [facility]. -Patient seen in consultation by orthopedics as patient noted to have acute abscess of right leg with calcification between the tibia and fibula and patient for irrigation and debridement of abscess with placement of wound VAC today 10/20/2020, and likely to return to surgery on Friday. Neurology was consulted and following, ID consulted and following, CT surgery following.   Assessment & Plan:   Principal Problem:   Bacterial endocarditis Active Problems:   Chronic combined systolic and diastolic congestive heart failure (HCC)   HTN (hypertension)   Anemia in chronic kidney disease   Hepatitis C   ESRD on dialysis ([place])   End stage renal disease (HCC)   Chest pain   Sepsis ([place])   Pneumonia due to infectious agent   ADPKD  (autosomal dominant polycystic kidney disease)   Sepsis due to pneumonia (HCC)   Lactic acidosis   Cerebral embolism with cerebral infarction   Bacteremia due to Staphylococcus aureus   Mitral regurgitation   Uncontrolled hypertension   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  Currently afebrile. -Patient noted to have positive blood cultures of MRSA 09/27/2020 and 09/30/2020. -Repeat blood cultures 10/03/2020 -. -TEE 10/05/2020 with LAA thrombus in 1.5 cm MV vegetation with negative bubble study. -Status post multiple teeth extraction 10/12/2020. -Status post minimally invasive mitral valve replacement 10/14/2020 and closure of PFO. -ID noted to have reviewed operative note that showed bulky purulent vegetation.  Per ID due to recent fever 10/13/2020, operative finding and poor tissue penetration by vancomycin recommending 6 weeks of antibiotics from 10/14/2020. -Continue IV vancomycin with HD through 11/25/2020. -Outpatient follow-up in ID clinic post discharge (3/24 with Dr. Gale Journey ). -CT surgery following.   2.  Chronic hep C Genotype ordered.  Outpatient follow-up with ID for treatment.  3.  Right tib-fib abscess(MRSA)/acute osteomyelitis Patient s/p irrigation and debridement (10/20/2020) per Dr. Sharol Given, orthopedics with probable surgery  intervention as cultures positive for MRSA and pathology consistent with acute osteomyelitis.    -Orthopedics recommended right AKA as soon as possible.   -Patient struggling with the idea of possible right lower extremity amputation stating he is only 50 years old and feels he has more life ahead of him and would like to exhaust all possibilities prior to consideration of amputation at this time.  Wanting insight into treatment of right lower extremity osteomyelitis/MRSA with long-term antibiotics prior to consideration of amputation.  -ID to reassess patient today to discuss options and other recommendations per patient request. -Continue IV antibiotics of vancomycin. -Orthopedics following and appreciate input and recommendations.  4.  End-stage renal disease on hemodialysis -Patient being followed by nephrology and receiving HD during this hospitalization. -Per nephrology.  5.  Hyponatremia -On HD.  Per nephrology.  6.  Hypertension Stable.  Continue clonidine patch, metoprolol.    7.  Anemia of chronic disease Status post transfusion 2 units packed red blood cells 10/14/2020. -Anemia panel with iron level of 23, TIBC BC of 193.   -Hemoglobin currently at 8.9.Marland Kitchen.   -Transfusion threshold hemoglobin < 7.  -Follow.    8.  Nasal bleeding Patient with some nasal bleeding noted the afternoon of 10/24/2020 which has since resolved.  Continue Afrin twice daily for another 24 hours and subsequently discontinue.        DVT prophylaxis: Lovenox. Code Status: Full Family Communication: Updated patient.  No family at bedside. Disposition:   Status is: Inpatient    Dispo: The patient is from: Home              Anticipated d/c is to: To be determined              Patient currently on IV antibiotics for MRSA endocarditis with embolic CVAs, status post mitral valve replacement,  s/p I&D of tibial abscess (10/20/2020) and patient for further surgical intervention of possible amputation.  Not stable for discharge.     Difficult to place patient undetermined       Consultants:    Orthopedics: Dr. Sharol Given 10/20/2020  Nephrology  Neurology: Dr. Rory Percy 09/28/2020  Infectious disease: Dr. Graylon Good 09/28/2020  Nephrology: Dr.Schertz 09/28/2020  CT surgery Dr. Roxy Manns 10/05/2020  Orthopedics: Dr. Percell Miller 10/19/2020  Procedures:   CT abdomen and pelvis 09/27/2020, 09/29/2020  CT head 09/28/2020  CT chest 09/29/2020  CT tib-fib 10/20/2020  Plain films right tib-fib 10/19/2020  Brain 09/28/2020  MRI angiogram head and neck 09/28/2020  MRI T-spine L-spine 09/30/2020  TEE 10/05/2020  Cardiac catheterization 10/07/2020--Dr. Berry  Upper extremity Dopplers 10/08/2020  Irrigation and debridement right lower extremities per Dr. Sharol Given 10/20/2020  Minimally invasive mitral valve replacement per Dr. Roxy Manns 10/14/2020  Antimicrobials:  Anti-infectives (From admission, onward)   Start     Dose/Rate Route Frequency Ordered Stop   10/25/20 0922  vancomycin (VANCOCIN) 500-5 MG/100ML-% IVPB       Note to Pharmacy: Kalman Shan   : cabinet override      10/25/20 0922 10/25/20 2129   10/21/20 0600  ceFAZolin (ANCEF) IVPB 2g/100 mL premix        2 g 200 mL/hr over 30 Minutes Intravenous On call to O.R. 10/20/20 1349 10/21/20 0650   10/20/20 2243  vancomycin (VANCOCIN) 500-5 MG/100ML-% IVPB       Note to Pharmacy: Wallace Cullens   : cabinet override      10/20/20 2243 10/20/20 2243   10/20/20 1437  ceFAZolin (ANCEF) 2-4 GM/100ML-% IVPB  Note to Pharmacy: Ladoris Gene   : cabinet override      10/20/20 1437 10/20/20 1612   10/20/20 1200  vancomycin (VANCOREADY) IVPB 500 mg/100 mL        500 mg 100 mL/hr over 60 Minutes Intravenous Every M-W-F (Hemodialysis) 10/20/20 0903     10/18/20 1124  vancomycin variable dose per unstable renal function (pharmacist dosing)  Status:  Discontinued         Does not apply See admin instructions 10/18/20 1124 10/20/20 0903   10/16/20 1600  vancomycin (VANCOREADY) IVPB 500 mg/100 mL  Status:  Discontinued        500 mg 100 mL/hr over 60 Minutes  Intravenous Every Sat (Hemodialysis) 10/16/20 1306 10/16/20 1312   10/15/20 1500  vancomycin (VANCOCIN) IVPB 500 mg/100 ml premix  Status:  Discontinued        500 mg 100 mL/hr over 60 Minutes Intravenous Every M-W-F (Hemodialysis) 10/15/20 1357 10/17/20 0942   10/14/20 2130  vancomycin (VANCOCIN) IVPB 1000 mg/200 mL premix  Status:  Discontinued        1,000 mg 200 mL/hr over 60 Minutes Intravenous  Once 10/14/20 1403 10/14/20 1547   10/14/20 2130  vancomycin (VANCOREADY) IVPB 500 mg/100 mL        500 mg 100 mL/hr over 60 Minutes Intravenous  Once 10/14/20 1547 10/14/20 2222   10/14/20 1730  cefUROXime (ZINACEF) 1.5 g in sodium chloride 0.9 % 100 mL IVPB        1.5 g 200 mL/hr over 30 Minutes Intravenous Every 24 hr x 2 10/14/20 1403 10/15/20 1718   10/14/20 1128  vancomycin (VANCOCIN) 1,000 mg in sodium chloride 0.9 % 1,000 mL irrigation  Status:  Discontinued          As needed 10/14/20 1128 10/14/20 1401   10/14/20 0400  vancomycin (VANCOREADY) IVPB 1250 mg/250 mL        1,250 mg 166.7 mL/hr over 90 Minutes Intravenous To Surgery 10/13/20 1039 10/14/20 0910   10/14/20 0400  cefUROXime (ZINACEF) 1.5 g in sodium chloride 0.9 % 100 mL IVPB        1.5 g 200 mL/hr over 30 Minutes Intravenous To Surgery 10/13/20 1039 10/14/20 0835   10/14/20 0400  cefUROXime (ZINACEF) 750 mg in sodium chloride 0.9 % 100 mL IVPB        750 mg 200 mL/hr over 30 Minutes Intravenous To Surgery 10/13/20 1039 10/14/20 1250   10/14/20 0400  vancomycin (VANCOCIN) 1,000 mg in sodium chloride 0.9 % 1,000 mL irrigation  Status:  Discontinued         Irrigation To Surgery 10/13/20 1039 10/14/20 1403   10/13/20 1657  vancomycin (VANCOCIN) 500-5 MG/100ML-% IVPB       Note to Pharmacy: Judieth Keens  : cabinet override      10/13/20 1657 10/13/20 1717   10/12/20 0600  ceFAZolin (ANCEF) IVPB 2g/100 mL premix        2 g 200 mL/hr over 30 Minutes Intravenous On call to O.R. 10/11/20 1841 10/12/20 1315   10/11/20  1415  vancomycin (VANCOCIN) IVPB 500 mg/100 ml premix  Status:  Discontinued        500 mg 100 mL/hr over 60 Minutes Intravenous Every M-W-F (Hemodialysis) 10/11/20 1413 10/14/20 1403   10/11/20 1413  vancomycin (VANCOCIN) 500-5 MG/100ML-% IVPB       Note to Pharmacy: Kalman Shan   : cabinet override      10/11/20 1413 10/12/20 0229   10/11/20 1353  Vancomycin (VANCOCIN) 750-5 MG/150ML-% IVPB  Status:  Discontinued       Note to Pharmacy: Kalman Shan   : cabinet override      10/11/20 1353 10/11/20 1417   10/08/20 1200  vancomycin (VANCOCIN) IVPB 750 mg/150 ml premix  Status:  Discontinued        750 mg 150 mL/hr over 60 Minutes Intravenous Every M-W-F (Hemodialysis) 10/05/20 1404 10/11/20 1413   10/06/20 1619  vancomycin (VANCOCIN) 500-5 MG/100ML-% IVPB       Note to Pharmacy: Judieth Keens  : cabinet override      10/06/20 1619 10/06/20 1736   10/06/20 1200  vancomycin (VANCOCIN) IVPB 750 mg/150 ml premix  Status:  Discontinued        750 mg 150 mL/hr over 60 Minutes Intravenous Every M-W-F (Hemodialysis) 10/04/20 0905 10/05/20 1404   10/06/20 1200  vancomycin (VANCOCIN) IVPB 500 mg/100 ml premix        500 mg 100 mL/hr over 60 Minutes Intravenous Every Wed (Hemodialysis) 10/05/20 1404 10/06/20 2000   10/01/20 2000  DAPTOmycin (CUBICIN) 840 mg in sodium chloride 0.9 % IVPB  Status:  Discontinued        840 mg 233.6 mL/hr over 30 Minutes Intravenous Every 48 hours 10/01/20 0922 10/01/20 1056   10/01/20 1200  vancomycin (VANCOCIN) IVPB 1000 mg/200 mL premix  Status:  Discontinued        1,000 mg 200 mL/hr over 60 Minutes Intravenous Every M-W-F (Hemodialysis) 10/01/20 1056 10/04/20 0845   09/29/20 1200  vancomycin (VANCOCIN) IVPB 1000 mg/200 mL premix  Status:  Discontinued        1,000 mg 200 mL/hr over 60 Minutes Intravenous Every M-W-F (Hemodialysis) 09/27/20 1443 10/01/20 0922   09/29/20 1200  ceFEPIme (MAXIPIME) 2 g in sodium chloride 0.9 % 100 mL IVPB  Status:  Discontinued         2 g 200 mL/hr over 30 Minutes Intravenous Every M-W-F (Hemodialysis) 09/27/20 1443 09/28/20 1054   09/28/20 0900  metroNIDAZOLE (FLAGYL) IVPB 500 mg  Status:  Discontinued        500 mg 100 mL/hr over 60 Minutes Intravenous Every 12 hours 09/27/20 1808 09/28/20 1054   09/28/20 0900  ceFEPIme (MAXIPIME) 1 g in sodium chloride 0.9 % 100 mL IVPB  Status:  Discontinued        1 g 200 mL/hr over 30 Minutes Intravenous Every 12 hours 09/27/20 1808 09/27/20 1812   09/27/20 1445  vancomycin (VANCOREADY) IVPB 2000 mg/400 mL        2,000 mg 200 mL/hr over 120 Minutes Intravenous  Once 09/27/20 1433 09/27/20 2330   09/27/20 1430  ceFEPIme (MAXIPIME) 2 g in sodium chloride 0.9 % 100 mL IVPB        2 g 200 mL/hr over 30 Minutes Intravenous  Once 09/27/20 1418 09/27/20 1541   09/27/20 1430  metroNIDAZOLE (FLAGYL) IVPB 500 mg        500 mg 100 mL/hr over 60 Minutes Intravenous  Once 09/27/20 1418 09/27/20 2214   09/27/20 1430  vancomycin (VANCOREADY) IVPB 1750 mg/350 mL  Status:  Discontinued        1,750 mg 175 mL/hr over 120 Minutes Intravenous  Once 09/27/20 1418 09/27/20 1433       Subjective: Patient in hemodialysis.  States some intermittent shortness of breath.  Some intermittent chest pain.  Complaining of right-sided pain when he turns around prior site of chest tube.  States right lower extremity pain currently being managed  by current oral pain regimen.   Objective: Vitals:   10/25/20 0807 10/25/20 0830 10/25/20 0900 10/25/20 0930  BP: 118/78 (!) 162/103 128/68 (!) 154/82  Pulse: 83 82  82  Resp: '20 12 16   '$ Temp: 97.7 F (36.5 C)     TempSrc: Oral     SpO2: 95% 97%    Weight:      Height:        Intake/Output Summary (Last 24 hours) at 10/25/2020 0947 Last data filed at 10/25/2020 0810 Gross per 24 hour  Intake 480 ml  Output -  Net 480 ml   Filed Weights   10/23/20 0451 10/25/20 0600 10/25/20 0720  Weight: 76.8 kg 78 kg 78 kg    Examination:  General exam:  NAD. Respiratory system: Scattered bibasilar crackles.  No wheezing.  No rhonchi.  Fair air movement.  Speaking in full sentences. Cardiovascular system: RRR no murmurs rubs or gallops.  No JVD.  No lower extremity edema. Gastrointestinal system: Abdomen is soft, nontender, nondistended, positive bowel sounds.  No rebound.  No guarding.  Central nervous system: Alert and oriented. No focal neurological deficits. Extremities: Right lower extremity with postop bandage.  Some tenderness to palpation.   Skin: No rashes, lesions or ulcers Psychiatry: Judgement and insight appear normal. Mood & affect appropriate.     Data Reviewed: I have personally reviewed following labs and imaging studies  CBC: Recent Labs  Lab 10/21/20 0520 10/21/20 1101 10/22/20 0158 10/23/20 0144 10/24/20 0341 10/25/20 0154  WBC 10.1 12.0* 12.0* 12.7* 11.9* 12.5*  NEUTROABS 8.1*  --  8.1* 9.1* 8.7* 8.9*  HGB 9.7* 9.6* 9.6* 9.8* 9.6* 8.9*  HCT 29.9* 29.6* 30.3* 31.2* 29.5* 27.9*  MCV 88.2 89.7 89.9 90.4 88.1 89.1  PLT 92* 332 313 362 424* 414*    Basic Metabolic Panel: Recent Labs  Lab 10/21/20 0520 10/22/20 0158 10/23/20 0144 10/24/20 0341 10/25/20 0154  NA 132* 132* 131* 129* 130*  K 3.9 3.9 3.9 4.3 4.3  CL 94* 93* 94* 93* 92*  CO2 '23 24 27 25 26  '$ GLUCOSE 103* 97 96 109* 97  BUN 22* 41* 21* 38* 48*  CREATININE 4.69* 6.31* 4.44* 6.56* 8.37*  CALCIUM 8.3* 8.2* 8.1* 8.4* 8.2*  PHOS 4.3 5.8* 3.8 4.8* 5.2*    GFR: Estimated Creatinine Clearance: 11.7 mL/min (A) (by C-G formula based on SCr of 8.37 mg/dL (H)).  Liver Function Tests: Recent Labs  Lab 10/21/20 0520 10/22/20 0158 10/23/20 0144 10/24/20 0341 10/25/20 0154  ALBUMIN 2.1* 2.1* 2.1* 2.1* 2.0*    CBG: No results for input(s): GLUCAP in the last 168 hours.   Recent Results (from the past 240 hour(s))  Aerobic/Anaerobic Culture w Gram Stain (surgical/deep wound)     Status: None (Preliminary result)   Collection Time: 10/20/20   4:12 PM   Specimen: Soft Tissue, Other  Result Value Ref Range Status   Specimen Description TISSUE  Final   Special Requests RIGHT LEG ABSC SPEC A  Final   Gram Stain   Final    RARE WBC PRESENT,BOTH PMN AND MONONUCLEAR NO ORGANISMS SEEN Performed at [facility], [address], [place], [place] [address]    Culture   Final    FEW METHICILLIN RESISTANT STAPHYLOCOCCUS AUREUS CRITICAL RESULT CALLED TO, READ BACK BY AND VERIFIED WITH: RN N.[email or web address] AT [id number] ON 10/22/2020 BY T.SAAD NO ANAEROBES ISOLATED; CULTURE IN PROGRESS FOR 5 DAYS    Report Status PENDING  Incomplete   Organism  ID, Bacteria METHICILLIN RESISTANT STAPHYLOCOCCUS AUREUS  Final      Susceptibility   Methicillin resistant staphylococcus aureus - MIC*    CIPROFLOXACIN >=8 RESISTANT Resistant     ERYTHROMYCIN >=8 RESISTANT Resistant     GENTAMICIN <=0.5 SENSITIVE Sensitive     OXACILLIN >=4 RESISTANT Resistant     TETRACYCLINE <=1 SENSITIVE Sensitive     VANCOMYCIN 1 SENSITIVE Sensitive     TRIMETH/SULFA <=10 SENSITIVE Sensitive     CLINDAMYCIN <=0.25 SENSITIVE Sensitive     RIFAMPIN <=0.5 SENSITIVE Sensitive     Inducible Clindamycin NEGATIVE Sensitive     * FEW METHICILLIN RESISTANT STAPHYLOCOCCUS AUREUS  Fungus Stain     Status: None   Collection Time: 10/20/20  4:12 PM   Specimen: Soft Tissue, Other  Result Value Ref Range Status   FUNGUS STAIN Final report  Final    Comment: (NOTE) Performed At: [facility] [address] [place], [place] [id number] Rush Farmer MD [id number]    Fungal Source TISSUE  Final    Comment: RIGHT LEG ABSC SPEC A Performed at [facility], [place] [address], [place], [place] [address]   Fungal Stain reflex     Status: None   Collection Time: 10/20/20  4:12 PM  Result Value Ref Range Status   Fungal stain result 1 Comment  Final    Comment: (NOTE) KOH/Calcofluor preparation:  no fungus observed. Performed At: [facility] [place], [place] [id number] Rush Farmer MD [id number]          Radiology Studies: No results found.      Scheduled Meds: . (feeding supplement) PROSource Plus  30 mL Oral BID BM  . aspirin EC  325 mg Oral Daily  . calcium acetate  667 mg Oral TID WC  . Chlorhexidine Gluconate Cloth  6 each Topical Q0600  . Chlorhexidine Gluconate Cloth  6 each Topical Q0600  . cinacalcet  30 mg Oral Q breakfast  . cloNIDine  0.3 mg Transdermal Weekly  . [START ON 10/29/2020] darbepoetin (ARANESP) injection - DIALYSIS  60 mcg Intravenous Q Fri-HD  . doxercalciferol      . doxercalciferol  3 mcg Intravenous Q M,W,F-HD  . enoxaparin (LOVENOX) injection  30 mg Subcutaneous Q48H  . feeding supplement (NEPRO CARB STEADY)  237 mL Oral TID BM  . gabapentin  200 mg Oral QHS  . HYDROmorphone      . influenza vac split quadrivalent PF  0.5 mL Intramuscular Tomorrow-1000  . metoprolol tartrate  50 mg Oral BID  . multivitamin  1 tablet Oral QHS  . oxymetazoline  1 spray Each Nare BID  . pantoprazole  40 mg Oral Daily  . senna-docusate  1 tablet Oral BID  . sodium chloride flush  3 mL Intravenous Q12H  . vancomycin       Continuous Infusions: . sodium chloride    . sodium chloride    . sodium chloride    . vancomycin 500 mg (10/22/20 1627)     LOS: 28 days    Time spent: 40 minutes    Irine Seal, MD [facility]   To contact the attending provider between 7A-7P or the covering provider during after hours 7P-7A, please log into the web site [email or web address] and access using universal [place] password for that web site. If you do not have the password, please call the hospital operator.  10/25/2020, 9:47 AM

## 2020-10-25 NOTE — Progress Notes (Signed)
Physical Therapy Treatment Patient Details Name: Patrick Anthony MRN: [id number] DOB: 04/09/1971 Today's Date: 10/25/2020    History of Present Illness Pt is a 50 y.o. male admitted 09/27/20 with fever, chills, chest pain, SOB, generalized weakness. Pt found to have MRSA bacteremia. 2/22 MRI showed multiple scattered small infarcts (bilateral cerebral hemispheres, R basal ganglia, bileteral cerebellum); suspect embolic process. Echo showed mitral valve vegetation, mild-moderate mitral regurgitation. No significant CAD on L heart cath. 3/8 dental extraction. 3/10 mini MVR with closure of PFO. 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 PMH includes CKD (HD MWF), HTN, CHF, Hep C, polysubstance abuse.    PT Comments    Pt received in chair, agreeable to seated/standing exercises and he performed with good tolerance and participation as detailed below. Pt deferred gait trial in hallway due to RLE pain and fiance visiting with him. Pt able to demo transfers from chair and pivot transfer to bed using RW and min guard while NWB on RLE to simulate technique if he ends up choosing amputation (per chart review, this is what ortho/ID MDs recommending). Pt continues to benefit from PT services to progress toward functional mobility goals. Continue to recommend HHPT, if pt does have further OR procedures will continue to assess post-op.   Follow Up Recommendations  Home health PT (pending medical course)     Equipment Recommendations  Rolling walker with 5" wheels;3in1 (PT)    Recommendations for Other Services       Precautions / Restrictions Precautions Precautions: Fall Restrictions Weight Bearing Restrictions: Yes RLE Weight Bearing: Weight bearing as tolerated    Mobility  Bed Mobility Overal bed mobility: Modified Independent             General bed mobility comments: pt requesting to remain seated EOB at end of session, his sig other present in  room    Transfers Overall transfer level: Needs assistance Equipment used: Rolling walker (2 wheeled) Transfers: Sit to/from [facility] Sit to Stand: Min guard Stand pivot transfers: Min guard       General transfer comment: practiced standing from chair<>RW and stand pivot transfer with NWB on RLE due to possibility of AKA in next couple of days; pt able to perform with min guard and manual assist to keep RLE advanced  Ambulation/Gait             General Gait Details: pt requesting to defer due to RLE pain and visitor in room   Stairs             Wheelchair Mobility    Modified Rankin (Stroke Patients Only)       Balance Overall balance assessment: Needs assistance Sitting-balance support: No upper extremity supported;Feet supported Sitting balance-Leahy Scale: Good     Standing balance support: No upper extremity supported;During functional activity;Bilateral upper extremity supported Standing balance-Leahy Scale: Fair Standing balance comment: used RW due to RLE pain, fair standing balance without RW with BLE support                            Cognition Arousal/Alertness: Awake/alert Behavior During Therapy: WFL for tasks assessed/performed Overall Cognitive Status: Impaired/Different from baseline                       Memory: Decreased recall of precautions;Decreased short-term memory Following Commands: Follows one step commands consistently Safety/Judgement: Decreased awareness of safety;Decreased awareness of deficits  Problem Solving: Requires verbal cues General Comments: Appropriate during session; answering questions; unsure of insight into deficits; Pt following most 1-step commands but some impulsivity noted      Exercises Other Exercises Other Exercises: seated BLE AROM: ankle pumps, marching, LAQ, hip adduction pillow squeezes x10 reps Other Exercises: standing BLE AROM: heel raises x10  reps Other Exercises: STS x5 reps from chair with LLE only (RLE advanced) for strengthening    General Comments General comments (skin integrity, edema, etc.): VSS on RA. Pt sig other present and supportive/encouraging.      Pertinent Vitals/Pain Pain Assessment: Faces Faces Pain Scale: Hurts a little bit Pain Location: RLE (tibia) Pain Descriptors / Indicators: Tender;Sore Pain Intervention(s): Monitored during session;Repositioned;Premedicated before session    Home Living                      Prior Function            PT Goals (current goals can now be found in the care plan section) Acute Rehab PT Goals Patient Stated Goal: to return home and be independent PT Goal Formulation: With patient Time For Goal Achievement: 10/31/20 Potential to Achieve Goals: Fair Progress towards PT goals: Progressing toward goals    Frequency    Min 3X/week      PT Plan Current plan remains appropriate    Co-evaluation              AM-PAC PT "6 Clicks" Mobility   Outcome Measure  Help needed turning from your back to your side while in a flat bed without using bedrails?: None Help needed moving from lying on your back to sitting on the side of a flat bed without using bedrails?: None Help needed moving to and from a bed to a chair (including a wheelchair)?: A Little Help needed standing up from a chair using your arms (e.g., wheelchair or bedside chair)?: A Little Help needed to walk in hospital room?: A Little Help needed climbing 3-5 steps with a railing? : A Lot 6 Click Score: 19    End of Session Equipment Utilized During Treatment: Gait belt Activity Tolerance: Patient tolerated treatment well Patient left: in bed;with call bell/phone within reach;with family/visitor present (seated EOB RN with fiance in room) Nurse Communication: Mobility status PT Visit Diagnosis: Difficulty in walking, not elsewhere classified (R26.2);Other abnormalities of gait and  mobility (R26.89)     Time: [id number] PT Time Calculation (min) (ACUTE ONLY): 10 min  Charges:  $Therapeutic Exercise: 8-22 mins                     Lyal Husted P., PTA Acute Rehabilitation Services Pager: [phone] Office: [place] 10/25/2020, 5:18 PM

## 2020-10-25 NOTE — Progress Notes (Signed)
[facility] Progress Note  Subjective: pt seen on HD, no new c/o.   Vitals:   10/25/20 1030 10/25/20 1100 10/25/20 1110 10/25/20 1211  BP: (!) 170/110 130/87 (!) 141/88 (!) 133/95  Pulse: 60   90  Resp:  (!) 24  18  Temp:   98.1 F (36.7 C) 98.1 F (36.7 C)  TempSrc:   Oral Oral  SpO2:  98%  94%  Weight:  74.5 kg    Height:        Exam:  alert, nad   no jvd  Chest cta bilat  Cor reg no RG  Abd soft ntnd no ascites   Ext no LE edema, R lower leg wrapped   Alert, NF, ox3   LUE AVG +bruit     OP HD: MWF [facility]    4h 350/500 new edw here 73- 75kg (was82.5kg)2/2.5 bath LUA AVGHep 5000 -Aranesp 16mgIVq 2wks - last 09/27/20 (Verified order) -Hectorol384m IV qHD    Assessment/ Plan: 1. SP min invasive MVR(bioprosthetic)replacement - on 3/10. Doing well from cardiac standpoint 2. Anterior shin osteomyelitis: status post I&D per orthopedics. Pathology showing staph aureus in the tibia and ortho recommending AKA.  Pt struggling w/ idea of amputation. 3. HTN/volume - continues onclonidine patch and metoprolol 25 bid. BP controlled, no volume overload on exam but Na down- will go for larger UF goal today 4. 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 Underwent tooth extraction3/8/22.PerID to get 6 wks IV vanc w/ end date of 11/25/20. 5. ESRD - usual HDMWF.HD today.  6. Anemiaof CKD-HGB 9- 10 range. S/P 2 units of PRBCs 3/10.Started on aranesp. No IV Fe due to osteomyelitis.  7. Secondary Hyperparathyroidism -Calcium and phosphorus controlled. Continue sensipar, binders, and hectorol 8. Hx Hep C /liver cirrhosis.       Patrick Anthony 10/25/2020, 12:56 PM   Recent Labs  Lab 10/24/20 0341 10/25/20 0154  K 4.3 4.3  BUN 38* 48*  CREATININE 6.56* 8.37*  CALCIUM 8.4* 8.2*  PHOS 4.8* 5.2*  HGB 9.6* 8.9*   Inpatient medications: . (feeding supplement) PROSource Plus  30 mL Oral BID  BM  . aspirin EC  325 mg Oral Daily  . calcium acetate  667 mg Oral TID WC  . Chlorhexidine Gluconate Cloth  6 each Topical Q0600  . Chlorhexidine Gluconate Cloth  6 each Topical Q0600  . cinacalcet  30 mg Oral Q breakfast  . cloNIDine  0.3 mg Transdermal Weekly  . [START ON 10/29/2020] darbepoetin (ARANESP) injection - DIALYSIS  60 mcg Intravenous Q Fri-HD  . doxercalciferol      . doxercalciferol  3 mcg Intravenous Q M,W,F-HD  . enoxaparin (LOVENOX) injection  30 mg Subcutaneous Q48H  . feeding supplement (NEPRO CARB STEADY)  237 mL Oral TID BM  . gabapentin  200 mg Oral QHS  . HYDROmorphone      . influenza vac split quadrivalent PF  0.5 mL Intramuscular Tomorrow-1000  . metoprolol tartrate  50 mg Oral BID  . multivitamin  1 tablet Oral QHS  . oxymetazoline  1 spray Each Nare BID  . pantoprazole  40 mg Oral Daily  . senna-docusate  1 tablet Oral BID  . sodium chloride flush  3 mL Intravenous Q12H   . sodium chloride    . sodium chloride    . sodium chloride    . vancomycin 500 mg (10/22/20 1627)   sodium chloride, sodium chloride, sodium chloride, acetaminophen, camphor-menthol, diphenhydrAMINE, HYDROmorphone,  ondansetron **OR** ondansetron (ZOFRAN) IV, oxyCODONE, pentafluoroprop-tetrafluoroeth, sodium chloride flush

## 2020-10-25 NOTE — Progress Notes (Signed)
PT Cancellation Note  Patient Details Name: Patrick Anthony MRN: [id number] DOB: Feb 16, 1971   Cancelled Treatment:    Reason Eval/Treat Not Completed: (P) Patient at procedure or test/unavailable at HD dept, will continue efforts per PT POC as schedule permits.   [name] [name] 10/25/2020, 10:18 AM

## 2020-10-25 NOTE — Progress Notes (Signed)
Mobility Specialist - Progress Note   10/25/20 1318  Mobility  Activity Ambulated in hall  Level of Assistance Standby assist, set-up cues, supervision of patient - no hands on  Assistive Device Front wheel walker  Distance Ambulated (ft) 470 ft  Mobility Response Tolerated well  Mobility performed by Mobility specialist  $Mobility charge 1 Mobility   Pt c/o R LE w/ ambulation, RN aware. HR remained ~106 throughout. Pt sitting up in recliner after walk.   Pricilla Handler Mobility Specialist Mobility Specialist Phone: [phone]

## 2020-10-25 NOTE — Progress Notes (Signed)
OT Cancellation Note  Patient Details Name: Patrick Anthony MRN: [id number] DOB: January 18, 1971   Cancelled Treatment:    Reason Eval/Treat Not Completed: Patient at procedure or test/ unavailable;Other (comment) pt out of room at HD. Will check back as time allows.  [name], COTA/L Acute Rehabilitation Services [phone]    [name] 10/25/2020, 8:58 AM

## 2020-10-25 NOTE — Consult Note (Signed)
Date of Admission:  09/27/2020          Reason for Consult: Osteomyelitis of the tibia    Referring Provider: Dr. Grandville Silos   Assessment:  1. Osteomyelitis of the tibia with MRSA 2. 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 MV replacement with bioprosthetic valve and closure of PFO 4. Sp full dental extractions 5. ESRD on HD 6. History of  [place] AV graft placed in March 2021 7. Chronic hepatitis C without hepatic coma  Plan:  1. HE CLEARLY NEEDS AKA and I have been quite clear about this (he seems that he would accept this if all of the providers are giving him the same opinion.) 2. Continue vancomycin and if he has AKA can keep original stop date 3. We happy to treat his hepatitis C as an outpatient.  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 (feeding supplement) PROSource Plus  30 mL Oral BID BM  . aspirin EC  325 mg Oral Daily  . calcium acetate  667 mg Oral TID WC  . Chlorhexidine Gluconate Cloth   6 each Topical Q0600  . Chlorhexidine Gluconate Cloth  6 each Topical Q0600  . cinacalcet  30 mg Oral Q breakfast  . cloNIDine  0.3 mg Transdermal Weekly  . [START ON 10/29/2020] darbepoetin (ARANESP) injection - DIALYSIS  60 mcg Intravenous Q Fri-HD  . doxercalciferol      . doxercalciferol  3 mcg Intravenous Q M,W,F-HD  . enoxaparin (LOVENOX) injection  30 mg Subcutaneous Q48H  . feeding supplement (NEPRO CARB STEADY)  237 mL Oral TID BM  . gabapentin  200 mg Oral QHS  . HYDROmorphone      . influenza vac split quadrivalent PF  0.5 mL Intramuscular Tomorrow-1000  . metoprolol tartrate  50 mg Oral BID  . multivitamin  1 tablet Oral QHS  . oxymetazoline  1 spray Each Nare BID  . pantoprazole  40 mg Oral Daily  . senna-docusate  1 tablet Oral BID  . sodium chloride flush  3 mL Intravenous Q12H   Continuous Infusions: . sodium chloride    . sodium chloride    . sodium chloride    . vancomycin 500 mg (10/22/20 1627)   PRN Meds:.sodium chloride, sodium chloride, sodium chloride, acetaminophen, camphor-menthol, diphenhydrAMINE, HYDROmorphone, ondansetron **OR** ondansetron (ZOFRAN) IV, oxyCODONE, pentafluoroprop-tetrafluoroeth, sodium chloride flush  HPI: Patrick Anthony is a 50 y.o. male who has end-stage renal disease is  on hemodialysis who was admitted in February 21 with malaise and fever and found to be septic from MRSA.  He had a mitral valve vegetation and PFO and had septic emboli to the brain.  Fortunately his AV graft never appeared to be involved in this bloodstream infection.  He was able to clear his bacteremia.  He underwent extraction of multiple teeth.  He then on 10 March underwent mitral valve replacement and closure of the PFO  In the interim he was discovered to have an abscess in his leg which was debrided by Dr. Sharol Given and turns out to involve his tibia.  We are not aware of this area when we had seen him on our service before.  Dr. Sharol Given has debrided tissue and MRSA  has been isolated.  He is rightfully recommending an above-the-knee amputation to control this infection.  The patient does not want to lose his leg but he really does not have any other viable option.  He does not undergo above-the-knee amputation he will undoubtedly become bacteremic again and seeded his new valve and also potentially seeded other sites such as his AV graft and other places that staph aureus likes to metastasize to.  Hopefully he will agree to the surgery and this will be done promptly before the infection becomes uncontrollable.  I was treated known about this prior to him having cardiothoracic surgery.  When I saw him he was not complaining of any pain at that site.  Currently his only other site where he has pain is in his surgical site.   Review of Systems: Review of Systems  Constitutional: Negative for chills, fever, malaise/fatigue and weight loss.  HENT: Negative for congestion and sore throat.   Eyes: Negative for blurred vision and photophobia.  Respiratory: Negative for cough, shortness of breath and wheezing.   Cardiovascular: Positive for chest pain. Negative for palpitations and leg swelling.  Gastrointestinal: Negative for abdominal pain, blood in stool, constipation, diarrhea, heartburn, melena, nausea and vomiting.  Genitourinary: Negative for dysuria, flank pain and hematuria.  Musculoskeletal: Positive for joint pain and myalgias. Negative for back pain and falls.  Skin: Negative for itching and rash.  Neurological: Negative for dizziness, focal weakness, loss of consciousness, weakness and headaches.  Endo/Heme/Allergies: Does not bruise/bleed easily.  Psychiatric/Behavioral: Positive for depression. Negative for suicidal ideas. The patient does not have insomnia.     Past Medical History:  Diagnosis Date  . 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  Chronic kidney disease    patient on dialysis tues-thurs-sat  . Cirrhosis of liver ([place])   . Drug addiction ([place])   . ETOH abuse   . Hepatitis   . Hypertension   . Mitral regurgitation   . Patent foramen ovale 10/23/2019  . Renal disorder renal failure  . S/P minimally-invasive mitral valve replacement with bioprosthetic valve 10/14/2020   31 mm Medtronic Mosaic stented porcine bioprosthetic tissue valve via right mini thoracotomy approach  . Sleep apnea   . Systolic and diastolic CHF, 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[place]) 04/19/2013    Social History   Tobacco Use  . Smoking status: Former Smoker    Packs/day: 0.20    Types: Cigarettes    Quit date: 10/05/2017    Years since quitting: 3.0  . Smokeless tobacco: Never Used  . Tobacco comment: Quit 3 years ago  Vaping Use  . Vaping  Use: Never used  Substance Use Topics  . Alcohol use: No  . Drug use: No    Comment: Quit    Family History  Problem Relation Age of Onset  . Hypertension Mother   . Cancer Mother        breast   No Known Allergies  OBJECTIVE: Blood pressure (!) 141/88, pulse 60, temperature 98.1 F (36.7 C), temperature source Oral, resp. rate (!) 24, height 6' 0.01" (1.829 m), weight 74.5 kg, SpO2 98 %.  Physical Exam Constitutional:      Appearance: He is well-developed.  HENT:     Head: Normocephalic and atraumatic.  Eyes:     Conjunctiva/sclera: Conjunctivae normal.  Cardiovascular:     Rate and Rhythm: Normal rate and regular rhythm.     Heart sounds: Normal heart sounds. No murmur heard. No friction rub. No gallop.   Pulmonary:     Effort: Pulmonary effort is normal. No respiratory distress.     Breath sounds: Normal breath sounds. No stridor. No wheezing or rhonchi.  Abdominal:     General: Abdomen is flat. There is no distension.     Palpations: Abdomen is soft.  Musculoskeletal:        General: No tenderness. Normal range of motion.     Cervical back: Normal range of motion and neck supple.  Skin:     General: Skin is warm and dry.     Coloration: Skin is not pale.     Findings: No erythema or rash.  Neurological:     General: No focal deficit present.     Mental Status: He is alert and oriented to person, place, and time.  Psychiatric:        Attention and Perception: Attention normal.        Mood and Affect: Mood is anxious.        Speech: Speech normal.        Behavior: Behavior normal.        Thought Content: Thought content normal.        Cognition and Memory: Cognition and memory normal.   right leg bandaged  Lab Results Lab Results  Component Value Date   WBC 12.5 (H) 10/25/2020   HGB 8.9 (L) 10/25/2020   HCT 27.9 (L) 10/25/2020   MCV 89.1 10/25/2020   PLT 414 (H) 10/25/2020    Lab Results  Component Value Date   CREATININE 8.37 (H) 10/25/2020   BUN 48 (H) 10/25/2020   NA 130 (L) 10/25/2020   K 4.3 10/25/2020   CL 92 (L) 10/25/2020   CO2 26 10/25/2020    Lab Results  Component Value Date   ALT 10 10/14/2020   AST 35 10/14/2020   ALKPHOS 46 10/14/2020   BILITOT 0.8 10/14/2020     Microbiology: Recent Results (from the past 240 hour(s))  Aerobic/Anaerobic Culture w Gram Stain (surgical/deep wound)     Status: None (Preliminary result)   Collection Time: 10/20/20  4:12 PM   Specimen: Soft Tissue, Other  Result Value Ref Range Status   Specimen Description TISSUE  Final   Special Requests RIGHT LEG ABSC SPEC A  Final   Gram Stain   Final    RARE WBC PRESENT,BOTH PMN AND MONONUCLEAR NO ORGANISMS SEEN Performed at [facility], [address] [place], [place] [address]    Culture   Final    FEW METHICILLIN RESISTANT STAPHYLOCOCCUS AUREUS CRITICAL RESULT CALLED TO, READ BACK BY AND VERIFIED WITH: RN N.CROSFON  AT 0954 ON 10/22/2020 BY T.SAAD NO ANAEROBES ISOLATED; CULTURE IN PROGRESS FOR 5 DAYS    Report Status PENDING  Incomplete   Organism ID, Bacteria METHICILLIN RESISTANT STAPHYLOCOCCUS AUREUS  Final      Susceptibility   Methicillin  resistant staphylococcus aureus - MIC*    CIPROFLOXACIN >=8 RESISTANT Resistant     ERYTHROMYCIN >=8 RESISTANT Resistant     GENTAMICIN <=0.5 SENSITIVE Sensitive     OXACILLIN >=4 RESISTANT Resistant     TETRACYCLINE <=1 SENSITIVE Sensitive     VANCOMYCIN 1 SENSITIVE Sensitive     TRIMETH/SULFA <=10 SENSITIVE Sensitive     CLINDAMYCIN <=0.25 SENSITIVE Sensitive     RIFAMPIN <=0.5 SENSITIVE Sensitive     Inducible Clindamycin NEGATIVE Sensitive     * FEW METHICILLIN RESISTANT STAPHYLOCOCCUS AUREUS  Fungus Stain     Status: None   Collection Time: 10/20/20  4:12 PM   Specimen: Soft Tissue, Other  Result Value Ref Range Status   FUNGUS STAIN Final report  Final    Comment: (NOTE) Performed At: [facility] [employer] [address] [place], [place] [id number] Rush Farmer MD [id number]    Fungal Source TISSUE  Final    Comment: RIGHT LEG ABSC SPEC A Performed at [facility], [place] [address] [place], [place] [address]   Fungal Stain reflex     Status: None   Collection Time: 10/20/20  4:12 PM  Result Value Ref Range Status   Fungal stain result 1 Comment  Final    Comment: (NOTE) KOH/Calcofluor preparation:  no fungus observed. Performed At: [facility] [address] [place], [place] [id number] Rush Farmer MD [id number]     Alcide Evener, [facility] [place] [facility] [phone] pager  10/25/2020, 11:24 AM

## 2020-10-26 DIAGNOSIS — I5032 Chronic diastolic (congestive) heart failure: Secondary | ICD-10-CM | POA: Diagnosis not present

## 2020-10-26 DIAGNOSIS — J984 Other disorders of lung: Secondary | ICD-10-CM | POA: Diagnosis not present

## 2020-10-26 DIAGNOSIS — L02415 Cutaneous abscess of right lower limb: Secondary | ICD-10-CM | POA: Diagnosis not present

## 2020-10-26 DIAGNOSIS — I132 Hypertensive heart and chronic kidney disease with heart failure and with stage 5 chronic kidney disease, or end stage renal disease: Secondary | ICD-10-CM | POA: Diagnosis not present

## 2020-10-26 DIAGNOSIS — I6310 Cerebral infarction due to embolism of unspecified precerebral artery: Secondary | ICD-10-CM | POA: Diagnosis not present

## 2020-10-26 DIAGNOSIS — Q211 Atrial septal defect: Secondary | ICD-10-CM

## 2020-10-26 DIAGNOSIS — I5042 Chronic combined systolic (congestive) and diastolic (congestive) heart failure: Secondary | ICD-10-CM

## 2020-10-26 DIAGNOSIS — B182 Chronic viral hepatitis C: Secondary | ICD-10-CM | POA: Diagnosis not present

## 2020-10-26 DIAGNOSIS — N2581 Secondary hyperparathyroidism of renal origin: Secondary | ICD-10-CM | POA: Diagnosis not present

## 2020-10-26 DIAGNOSIS — Z9889 Other specified postprocedural states: Secondary | ICD-10-CM | POA: Diagnosis not present

## 2020-10-26 DIAGNOSIS — N186 End stage renal disease: Secondary | ICD-10-CM | POA: Diagnosis not present

## 2020-10-26 DIAGNOSIS — L0291 Cutaneous abscess, unspecified: Secondary | ICD-10-CM | POA: Diagnosis not present

## 2020-10-26 DIAGNOSIS — I10 Essential (primary) hypertension: Secondary | ICD-10-CM | POA: Diagnosis not present

## 2020-10-26 DIAGNOSIS — Z952 Presence of prosthetic heart valve: Secondary | ICD-10-CM

## 2020-10-26 DIAGNOSIS — I639 Cerebral infarction, unspecified: Secondary | ICD-10-CM | POA: Diagnosis not present

## 2020-10-26 DIAGNOSIS — A419 Sepsis, unspecified organism: Secondary | ICD-10-CM | POA: Diagnosis not present

## 2020-10-26 DIAGNOSIS — I330 Acute and subacute infective endocarditis: Secondary | ICD-10-CM | POA: Diagnosis not present

## 2020-10-26 DIAGNOSIS — M86461 Chronic osteomyelitis with draining sinus, right tibia and fibula: Secondary | ICD-10-CM | POA: Diagnosis not present

## 2020-10-26 DIAGNOSIS — D631 Anemia in chronic kidney disease: Secondary | ICD-10-CM | POA: Diagnosis not present

## 2020-10-26 DIAGNOSIS — R7881 Bacteremia: Secondary | ICD-10-CM | POA: Diagnosis not present

## 2020-10-26 DIAGNOSIS — Z992 Dependence on renal dialysis: Secondary | ICD-10-CM | POA: Diagnosis not present

## 2020-10-26 DIAGNOSIS — Q612 Polycystic kidney, adult type: Secondary | ICD-10-CM | POA: Diagnosis not present

## 2020-10-26 LAB — CBC WITH DIFFERENTIAL/PLATELET
Abs Immature Granulocytes: 0.11 10*3/uL — ABNORMAL HIGH (ref 0.00–0.07)
Basophils Absolute: 0 10*3/uL (ref 0.0–0.1)
Basophils Relative: 0 %
Eosinophils Absolute: 0.2 10*3/uL (ref 0.0–0.5)
Eosinophils Relative: 2 %
HCT: 30.3 % — ABNORMAL LOW (ref 39.0–52.0)
Hemoglobin: 9.7 g/dL — ABNORMAL LOW (ref 13.0–17.0)
Immature Granulocytes: 1 %
Lymphocytes Relative: 11 %
Lymphs Abs: 1.3 10*3/uL (ref 0.7–4.0)
MCH: 28.7 pg (ref 26.0–34.0)
MCHC: 32 g/dL (ref 30.0–36.0)
MCV: 89.6 fL (ref 80.0–100.0)
Monocytes Absolute: 1.7 10*3/uL — ABNORMAL HIGH (ref 0.1–1.0)
Monocytes Relative: 15 %
Neutro Abs: 8.2 10*3/uL — ABNORMAL HIGH (ref 1.7–7.7)
Neutrophils Relative %: 71 %
Platelets: 435 10*3/uL — ABNORMAL HIGH (ref 150–400)
RBC: 3.38 MIL/uL — ABNORMAL LOW (ref 4.22–5.81)
RDW: 18.1 % — ABNORMAL HIGH (ref 11.5–15.5)
WBC: 11.5 10*3/uL — ABNORMAL HIGH (ref 4.0–10.5)
nRBC: 0 % (ref 0.0–0.2)

## 2020-10-26 LAB — AEROBIC/ANAEROBIC CULTURE W GRAM STAIN (SURGICAL/DEEP WOUND)

## 2020-10-26 LAB — RENAL FUNCTION PANEL
Albumin: 2.1 g/dL — ABNORMAL LOW (ref 3.5–5.0)
Anion gap: 11 (ref 5–15)
BUN: 34 mg/dL — ABNORMAL HIGH (ref 6–20)
CO2: 28 mmol/L (ref 22–32)
Calcium: 8.7 mg/dL — ABNORMAL LOW (ref 8.9–10.3)
Chloride: 93 mmol/L — ABNORMAL LOW (ref 98–111)
Creatinine, Ser: 6.03 mg/dL — ABNORMAL HIGH (ref 0.61–1.24)
GFR, Estimated: 11 mL/min — ABNORMAL LOW (ref >60)
Glucose, Bld: 116 mg/dL — ABNORMAL HIGH (ref 70–99)
Phosphorus: 4.7 mg/dL — ABNORMAL HIGH (ref 2.5–4.6)
Potassium: 3.6 mmol/L (ref 3.5–5.1)
Sodium: 132 mmol/L — ABNORMAL LOW (ref 135–145)

## 2020-10-26 NOTE — Progress Notes (Signed)
[facility] Progress Note  Subjective: pt seen in room. No new c/o.    Vitals:   10/25/20 2354 10/26/20 0345 10/26/20 0824 10/26/20 1135  BP: 113/80 110/89 124/84 (!) 121/94  Pulse: 86 78 82 80  Resp: '17 18  19  '$ Temp: 98.2 F (36.8 C) 97.7 F (36.5 C)  97.7 F (36.5 C)  TempSrc: Oral Oral  Oral  SpO2: 95% 95%  100%  Weight:  76.1 kg    Height:        Exam:  alert, nad   no jvd  Chest cta bilat  Cor reg no RG  Abd soft ntnd no ascites   Ext no LE edema, R lower leg wrapped   Alert, NF, ox3   LUE AVG +bruit     OP HD: [facility]    4h 350/500 new edw here 73- 75kg (was82.5kg)2/2.5 bath LUA AVGHep 5000 -Aranesp 76mgIVq 2wks - last 09/27/20 (Verified order) -Hectorol330m IV qHD    Assessment/ Plan: 1. SP min invasive MVR(bioprosthetic)replacement - on 3/10. Doing well from cardiac standpoint 2. Anterior shin osteomyelitis: status post I&D per orthopedics. Pathology showing staph aureus in the tibia and ortho recommending AKA.  ID following.  3. HTN/volume - continues on clonidine patch and metoprolol 25 bid. BP okay, 3.5 L off w/ HD yest , no BP drops. 4. 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 Underwent tooth extraction3/8/22.PerID to get 6 wks IV vanc w/ end date of 11/25/20. 5. ESRD - usual HDMWF.HD tomorrow.  6. Anemiaof CKD-HGB 9- 10 range. S/P 2 units of PRBCs 3/10.Started on aranesp. No IV Fe due to osteomyelitis.  7. Secondary Hyperparathyroidism -Calcium and phosphorus controlled. Continue sensipar, binders, and hectorol 8. Hx Hep C /liver cirrhosis.       Rob ScDoctor, hospital/22/2022, 1:25 PM   Recent Labs  Lab 10/25/20 0154 10/26/20 0138  K 4.3 3.6  BUN 48* 34*  CREATININE 8.37* 6.03*  CALCIUM 8.2* 8.7*  PHOS 5.2* 4.7*  HGB 8.9* 9.7*   Inpatient medications: . (feeding supplement) PROSource Plus  30 mL Oral BID BM  . aspirin EC  325 mg Oral Daily  .  calcium acetate  667 mg Oral TID WC  . Chlorhexidine Gluconate Cloth  6 each Topical Q0600  . Chlorhexidine Gluconate Cloth  6 each Topical Q0600  . cinacalcet  30 mg Oral Q breakfast  . cloNIDine  0.3 mg Transdermal Weekly  . [START ON 10/29/2020] darbepoetin (ARANESP) injection - DIALYSIS  60 mcg Intravenous Q Fri-HD  . doxercalciferol  3 mcg Intravenous Q M,W,F-HD  . enoxaparin (LOVENOX) injection  30 mg Subcutaneous Q48H  . feeding supplement (NEPRO CARB STEADY)  237 mL Oral TID BM  . gabapentin  200 mg Oral QHS  . influenza vac split quadrivalent PF  0.5 mL Intramuscular Tomorrow-1000  . metoprolol tartrate  50 mg Oral BID  . multivitamin  1 tablet Oral QHS  . oxymetazoline  1 spray Each Nare BID  . pantoprazole  40 mg Oral Daily  . senna-docusate  1 tablet Oral BID  . sodium chloride flush  3 mL Intravenous Q12H   . sodium chloride    . sodium chloride    . sodium chloride    . vancomycin 500 mg (10/22/20 1627)   sodium chloride, sodium chloride, sodium chloride, acetaminophen, camphor-menthol, diphenhydrAMINE, HYDROmorphone, ondansetron **OR** ondansetron (ZOFRAN) IV, oxyCODONE, pentafluoroprop-tetrafluoroeth, sodium chloride flush

## 2020-10-26 NOTE — Progress Notes (Signed)
Sutures removed from patient per physician order. No complications, vital signs stable and patient tolerated well. Will continue to monitor.   -Donnelly Angelica, RN

## 2020-10-26 NOTE — Progress Notes (Addendum)
      [id number]       [place] [id number]             [phone]    12 Days Post-Op MINIMALLY INVASIVE MITRAL VALVE REPLACEMEN USING MEDTRONIC MOSAIC VALVE SIZE 31MM  TRANSESOPHAGEAL ECHOCARDIOGRAM  CLOSURE OF PATENT FORAMEN OVALE    6 Days Post-Op Procedure(s) (LRB): IRRIGATION AND DEBRIDEMENT OF LEG (Right)   Subjective: Resting in bed. Says is is doing OK, no new concerns.   Objective: Vital signs in last 24 hours: Temp:  [97.7 F (36.5 C)-99.5 F (37.5 C)] 97.7 F (36.5 C) (03/22 0345) Pulse Rate:  [60-97] 78 (03/22 0345) Cardiac Rhythm: Heart block (03/21 1900) Resp:  [12-24] 18 (03/22 0345) BP: (98-170)/(68-110) 110/89 (03/22 0345) SpO2:  [94 %-98 %] 95 % (03/22 0345) Weight:  [74.5 kg-76.1 kg] 76.1 kg (03/22 0345)     Intake/Output from previous day: 03/21 0701 - 03/22 0700 In: 480 [P.O.:480] Out: 3500  Intake/Output this shift: No intake/output data recorded.  General appearance: alert, cooperative and mild distress Neurologic: intact Heart: RRR, no murmur. Non arrhythmias on monitor review.  Lungs: Breath sounds are clear, good sats on RA.  Wound: the right chest incision and old CT exit sites are well approximated and dry, healing with no signs of infection.   Lab Results: Recent Labs    10/25/20 0154 10/26/20 0138  WBC 12.5* 11.5*  HGB 8.9* 9.7*  HCT 27.9* 30.3*  PLT 414* 435*   BMET:  Recent Labs    10/25/20 0154 10/26/20 0138  NA 130* 132*  K 4.3 3.6  CL 92* 93*  CO2 26 28  GLUCOSE 97 116*  BUN 48* 34*  CREATININE 8.37* 6.03*  CALCIUM 8.2* 8.7*    PT/INR: No results for input(s): LABPROT, INR in the last 72 hours. ABG    Component Value Date/Time   PHART 7.393 10/15/2020 0622   HCO3 20.6 10/15/2020 0622   TCO2 22 10/15/2020 0622   ACIDBASEDEF 4.0 (H) 10/15/2020 0622   O2SAT 95.0 10/15/2020 0622   CBG (last 3)  No results for input(s): GLUCAP in the last 72 hours.  Assessment/Plan: S/P Procedure(s)  (LRB): IRRIGATION AND DEBRIDEMENT OF LEG (Right)  -POD12 MV replacement for MRSA endocarditis and closure of PFO presenting with bilateral cerebral emboli. Remains hemodynamically stable and cardiac rhythm is stable.  ID following, recommend IV vancomycin through 4/21.  D/C chest sutures today.   -Extensive osteomyelitis RLE--Discussed and reinforce the recommendation of ortho, ID, and Dr. Roxy Manns for urgent right AKA to avoid complications with his prosthetic mitral valve that would likely prove to be fatal.  He sates "I want to hear some better news about this".  Dr. Jess Barters discussion noted:  Mr. Theesfeld wants to go home and talk to his PCP about this.     LOS: 29 days   Antony Odea, PA-C [phone] 10/26/2020   I have seen and examined the patient and agree with the assessment and plan as outlined.  I have reinforced the recommendations of Dr. Sharol Given and others that patient needs AKA to control his infection.  Risk of reinfection of new mitral valve prosthesis is very high.  Mr. Brogan would not be considered a candidate for redo MVR under any circumstances.  Rexene Alberts, MD 10/26/2020 1:50 PM

## 2020-10-26 NOTE — Progress Notes (Signed)
PROGRESS NOTE    Patrick Anthony  [id number] DOB: 06-04-1971 DOA: 09/27/2020 PCP: [name], MD   Chief Complaint  Patient presents with  . 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  Patient noted to have a MRSA bacteremia complicated by embolic CVA felt likely secondary to endocarditis of the mitral valve noted on 2D echo.  Patient had follow-up TEE which showed a large vegetation with no evidence of intra-arterial shunt. ID consulted and following up on blood cultures and patient placed empirically on IV vancomycin.  CT surgery consulted for surgical intervention for mitral valve endocarditis.  Patient also seen in consultation by cardiology.  Patient subsequently underwent minimally invasive mitral valve replacement per CT surgery 10/14/2020. Patient was on CT surgeons service and subsequently transferred to [facility]. -Patient seen in consultation by orthopedics as patient noted to have acute abscess of right leg with calcification between the tibia and fibula and patient for irrigation and debridement of abscess with placement of wound VAC today 10/20/2020, and likely to return to surgery on Friday. Neurology was consulted and following, ID consulted and following, CT surgery following.   Assessment & Plan:   Principal Problem:   Bacterial endocarditis Active Problems:   Chronic combined systolic and diastolic congestive heart failure (HCC)   HTN (hypertension)   Anemia in chronic kidney disease   Hepatitis C   ESRD on dialysis ([place])   End stage renal disease (HCC)   Chest pain   Sepsis ([place])   Pneumonia due to infectious agent   ADPKD  (autosomal dominant polycystic kidney disease)   Sepsis due to pneumonia (HCC)   Lactic acidosis   Cerebral embolism with cerebral infarction   Bacteremia due to Staphylococcus aureus   Mitral regurgitation   Uncontrolled hypertension   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  Currently afebrile. -Patient noted to have positive blood cultures of MRSA 09/27/2020 and 09/30/2020. -Repeat blood cultures 10/03/2020 -. -TEE 10/05/2020 with LAA thrombus in 1.5 cm MV vegetation with negative bubble study. -Status post multiple teeth extraction 10/12/2020. -Status post minimally invasive mitral valve replacement 10/14/2020 and closure of PFO. -ID noted to have reviewed operative note that showed bulky purulent vegetation.  Per ID due to recent fever 10/13/2020, operative finding and poor tissue penetration by vancomycin recommending 6 weeks of antibiotics from 10/14/2020. -Continue IV vancomycin with HD through 11/25/2020. -Outpatient follow-up in ID clinic post discharge (3/24 with Dr. Gale Journey ). -CT surgery following.   2.  Chronic hep C Genotype ordered.  Outpatient follow-up with ID for treatment.  3.  Right tib-fib abscess(MRSA)/acute osteomyelitis Patient s/p irrigation and debridement (10/20/2020) per Dr. Sharol Given, orthopedics with probable surgery  intervention as cultures positive for MRSA and pathology consistent with acute osteomyelitis.    -Orthopedics recommended right AKA as soon as possible.   -Patient struggling with the idea of possible right lower extremity amputation stating he is only 50 years old and feels he has more life ahead of him and would like to exhaust all possibilities prior to consideration of amputation at this time.  Wanting insight into treatment of right lower extremity osteomyelitis/MRSA with long-term antibiotics prior to consideration of amputation.  -ID has reassessed and in agreement that patient clearly needs a right AKA.   -CT surgery also in agreement that patient needs right AKA.   -Patient still resistant to the idea of AKA and feels no other options have been presented to him.  Patient stating he wants to discuss with his PCP and kidney doctors. -Continue IV vancomycin.  -Orthopedics following.   4.  End-stage renal disease on hemodialysis -Patient being followed by nephrology and receiving HD during this hospitalization. -Per nephrology.  5.  Hyponatremia -On HD.  Per nephrology.  6.  Hypertension Stable on current regimen of metoprolol and clonidine patch.    7.  Anemia of chronic disease -Anemia panel with iron level of 23, TIBC of 193.   - status post transfusion 2 units packed red blood cells 10/14/2020 . -Hemoglobin stable at 9.7  -Transfusion threshold hemoglobin < 7.  8.  Nasal bleeding -Patient with some nasal bleeding noted the afternoon of 10/24/2020 which has since resolved.   -Discontinue Afrin.       DVT prophylaxis: Lovenox. Code Status: Full Family Communication: Updated patient.  No family at bedside. Disposition:   Status is: Inpatient    Dispo: The patient is from: Home              Anticipated d/c is to: To be determined              Patient currently on IV antibiotics for MRSA endocarditis with embolic CVAs, status post mitral valve replacement,  s/p I&D of tibial abscess (10/20/2020) and patient for possible surgical intervention of possible amputation  if patient is in agreement.  Not stable for discharge.     Difficult to place patient undetermined       Consultants:   Orthopedics: Dr. Sharol Given 10/20/2020  Nephrology  Neurology: Dr. Rory Percy 09/28/2020  Infectious disease: Dr. Graylon Good 09/28/2020  Nephrology: Dr.Schertz 09/28/2020  CT surgery Dr. Roxy Manns 10/05/2020  Orthopedics: Dr. Percell Miller 10/19/2020  Procedures:   CT abdomen and pelvis 09/27/2020, 09/29/2020  CT head 09/28/2020  CT chest 09/29/2020  CT tib-fib 10/20/2020  Plain films right tib-fib 10/19/2020  Brain 09/28/2020  MRI angiogram head and neck 09/28/2020  MRI T-spine L-spine 09/30/2020  TEE 10/05/2020  Cardiac catheterization 10/07/2020--Dr. Berry  Upper extremity Dopplers 10/08/2020  Irrigation and debridement right lower extremities per Dr. Sharol Given 10/20/2020  Minimally invasive mitral valve replacement per Dr. Roxy Manns 10/14/2020  Antimicrobials:  Anti-infectives (From admission, onward)   Start     Dose/Rate Route Frequency Ordered Stop   10/25/20 0922  vancomycin (VANCOCIN) 500-5 MG/100ML-% IVPB       Note to Pharmacy: Kalman Shan   : cabinet override      10/25/20 0922 10/25/20 0958   10/21/20 0600  ceFAZolin (ANCEF) IVPB 2g/100 mL premix        2 g 200 mL/hr over 30 Minutes Intravenous On call to O.R. 10/20/20 1349 10/21/20 0650   10/20/20 2243  vancomycin (VANCOCIN) 500-5 MG/100ML-% IVPB  Note to Pharmacy: Wallace Cullens   : cabinet override      10/20/20 2243 10/20/20 2243   10/20/20 1437  ceFAZolin (ANCEF) 2-4 GM/100ML-% IVPB       Note to Pharmacy: Ladoris Gene   : cabinet override      10/20/20 1437 10/20/20 1612   10/20/20 1200  vancomycin (VANCOREADY) IVPB 500 mg/100 mL        500 mg 100 mL/hr over 60 Minutes Intravenous Every M-W-F (Hemodialysis) 10/20/20 0903     10/18/20 1124  vancomycin variable dose per unstable renal function (pharmacist dosing)  Status:  Discontinued         Does not apply See admin instructions 10/18/20 1124 10/20/20 0903    10/16/20 1600  vancomycin (VANCOREADY) IVPB 500 mg/100 mL  Status:  Discontinued        500 mg 100 mL/hr over 60 Minutes Intravenous Every Sat (Hemodialysis) 10/16/20 1306 10/16/20 1312   10/15/20 1500  vancomycin (VANCOCIN) IVPB 500 mg/100 ml premix  Status:  Discontinued        500 mg 100 mL/hr over 60 Minutes Intravenous Every M-W-F (Hemodialysis) 10/15/20 1357 10/17/20 0942   10/14/20 2130  vancomycin (VANCOCIN) IVPB 1000 mg/200 mL premix  Status:  Discontinued        1,000 mg 200 mL/hr over 60 Minutes Intravenous  Once 10/14/20 1403 10/14/20 1547   10/14/20 2130  vancomycin (VANCOREADY) IVPB 500 mg/100 mL        500 mg 100 mL/hr over 60 Minutes Intravenous  Once 10/14/20 1547 10/14/20 2222   10/14/20 1730  cefUROXime (ZINACEF) 1.5 g in sodium chloride 0.9 % 100 mL IVPB        1.5 g 200 mL/hr over 30 Minutes Intravenous Every 24 hr x 2 10/14/20 1403 10/15/20 1718   10/14/20 1128  vancomycin (VANCOCIN) 1,000 mg in sodium chloride 0.9 % 1,000 mL irrigation  Status:  Discontinued          As needed 10/14/20 1128 10/14/20 1401   10/14/20 0400  vancomycin (VANCOREADY) IVPB 1250 mg/250 mL        1,250 mg 166.7 mL/hr over 90 Minutes Intravenous To Surgery 10/13/20 1039 10/14/20 0910   10/14/20 0400  cefUROXime (ZINACEF) 1.5 g in sodium chloride 0.9 % 100 mL IVPB        1.5 g 200 mL/hr over 30 Minutes Intravenous To Surgery 10/13/20 1039 10/14/20 0835   10/14/20 0400  cefUROXime (ZINACEF) 750 mg in sodium chloride 0.9 % 100 mL IVPB        750 mg 200 mL/hr over 30 Minutes Intravenous To Surgery 10/13/20 1039 10/14/20 1250   10/14/20 0400  vancomycin (VANCOCIN) 1,000 mg in sodium chloride 0.9 % 1,000 mL irrigation  Status:  Discontinued         Irrigation To Surgery 10/13/20 1039 10/14/20 1403   10/13/20 1657  vancomycin (VANCOCIN) 500-5 MG/100ML-% IVPB       Note to Pharmacy: Judieth Keens  : cabinet override      10/13/20 1657 10/13/20 1717   10/12/20 0600  ceFAZolin (ANCEF) IVPB  2g/100 mL premix        2 g 200 mL/hr over 30 Minutes Intravenous On call to O.R. 10/11/20 1841 10/12/20 1315   10/11/20 1415  vancomycin (VANCOCIN) IVPB 500 mg/100 ml premix  Status:  Discontinued        500 mg 100 mL/hr over 60 Minutes Intravenous Every M-W-F (Hemodialysis) 10/11/20 1413 10/14/20 1403   10/11/20 1413  vancomycin (VANCOCIN) 500-5 MG/100ML-% IVPB       Note to Pharmacy: Kalman Shan   : cabinet override      10/11/20 1413 10/12/20 0229   10/11/20 1353  Vancomycin (VANCOCIN) 750-5 MG/150ML-% IVPB  Status:  Discontinued       Note to Pharmacy: Kalman Shan   : cabinet override      10/11/20 1353 10/11/20 1417   10/08/20 1200  vancomycin (VANCOCIN) IVPB 750 mg/150 ml premix  Status:  Discontinued        750 mg 150 mL/hr over 60 Minutes Intravenous Every M-W-F (Hemodialysis) 10/05/20 1404 10/11/20 1413   10/06/20 1619  vancomycin (VANCOCIN) 500-5 MG/100ML-% IVPB       Note to Pharmacy: Judieth Keens  : cabinet override      10/06/20 1619 10/06/20 1736   10/06/20 1200  vancomycin (VANCOCIN) IVPB 750 mg/150 ml premix  Status:  Discontinued        750 mg 150 mL/hr over 60 Minutes Intravenous Every M-W-F (Hemodialysis) 10/04/20 0905 10/05/20 1404   10/06/20 1200  vancomycin (VANCOCIN) IVPB 500 mg/100 ml premix        500 mg 100 mL/hr over 60 Minutes Intravenous Every Wed (Hemodialysis) 10/05/20 1404 10/06/20 2000   10/01/20 2000  DAPTOmycin (CUBICIN) 840 mg in sodium chloride 0.9 % IVPB  Status:  Discontinued        840 mg 233.6 mL/hr over 30 Minutes Intravenous Every 48 hours 10/01/20 0922 10/01/20 1056   10/01/20 1200  vancomycin (VANCOCIN) IVPB 1000 mg/200 mL premix  Status:  Discontinued        1,000 mg 200 mL/hr over 60 Minutes Intravenous Every M-W-F (Hemodialysis) 10/01/20 1056 10/04/20 0845   09/29/20 1200  vancomycin (VANCOCIN) IVPB 1000 mg/200 mL premix  Status:  Discontinued        1,000 mg 200 mL/hr over 60 Minutes Intravenous Every M-W-F (Hemodialysis)  09/27/20 1443 10/01/20 0922   09/29/20 1200  ceFEPIme (MAXIPIME) 2 g in sodium chloride 0.9 % 100 mL IVPB  Status:  Discontinued        2 g 200 mL/hr over 30 Minutes Intravenous Every M-W-F (Hemodialysis) 09/27/20 1443 09/28/20 1054   09/28/20 0900  metroNIDAZOLE (FLAGYL) IVPB 500 mg  Status:  Discontinued        500 mg 100 mL/hr over 60 Minutes Intravenous Every 12 hours 09/27/20 1808 09/28/20 1054   09/28/20 0900  ceFEPIme (MAXIPIME) 1 g in sodium chloride 0.9 % 100 mL IVPB  Status:  Discontinued        1 g 200 mL/hr over 30 Minutes Intravenous Every 12 hours 09/27/20 1808 09/27/20 1812   09/27/20 1445  vancomycin (VANCOREADY) IVPB 2000 mg/400 mL        2,000 mg 200 mL/hr over 120 Minutes Intravenous  Once 09/27/20 1433 09/27/20 2330   09/27/20 1430  ceFEPIme (MAXIPIME) 2 g in sodium chloride 0.9 % 100 mL IVPB        2 g 200 mL/hr over 30 Minutes Intravenous  Once 09/27/20 1418 09/27/20 1541   09/27/20 1430  metroNIDAZOLE (FLAGYL) IVPB 500 mg        500 mg 100 mL/hr over 60 Minutes Intravenous  Once 09/27/20 1418 09/27/20 2214   09/27/20 1430  vancomycin (VANCOREADY) IVPB 1750 mg/350 mL  Status:  Discontinued        1,750 mg 175 mL/hr over 120 Minutes Intravenous  Once 09/27/20 1418 09/27/20 1433       Subjective: Patient laying in  bed.  Denies any chest pain.  No shortness of breath.   Still having a difficult time deciding on amputation and would like to speak with his PCP and kidney doctors about it.   Patient feeling his life is totally gone will be changed post amputation.   Very hesitant at this time to get an amputation.   Objective: Vitals:   10/25/20 2026 10/25/20 2354 10/26/20 0345 10/26/20 0824  BP: 98/69 113/80 110/89 124/84  Pulse: 95 86 78 82  Resp: '17 17 18   '$ Temp: 98.4 F (36.9 C) 98.2 F (36.8 C) 97.7 F (36.5 C)   TempSrc: Oral Oral Oral   SpO2: 96% 95% 95%   Weight:   76.1 kg   Height:       No intake or output data in the 24 hours ending 10/26/20  1134 Filed Weights   10/25/20 0720 10/25/20 1100 10/26/20 0345  Weight: 78 kg 74.5 kg 76.1 kg    Examination:  General exam: NAD. Respiratory system: Lungs clear to auscultation bilaterally.  No wheezes, no crackles, no rhonchi.  Fair air movement.  Speaking in full sentences.  Cardiovascular system: Regular rate and rhythm no murmurs rubs or gallops.  No JVD.  No lower extremity edema. Gastrointestinal system: Abdomen is soft, nontender, nondistended, positive bowel sounds.  No rebound.  No guarding.  Central nervous system: Alert and oriented. No focal neurological deficits. Extremities: Right lower extremity in postop bandage.  Some tenderness to palpation.  Skin: No rashes, lesions or ulcers Psychiatry: Judgement and insight appear normal. Mood & affect appropriate.     Data Reviewed: I have personally reviewed following labs and imaging studies  CBC: Recent Labs  Lab 10/22/20 0158 10/23/20 0144 10/24/20 0341 10/25/20 0154 10/26/20 0138  WBC 12.0* 12.7* 11.9* 12.5* 11.5*  NEUTROABS 8.1* 9.1* 8.7* 8.9* 8.2*  HGB 9.6* 9.8* 9.6* 8.9* 9.7*  HCT 30.3* 31.2* 29.5* 27.9* 30.3*  MCV 89.9 90.4 88.1 89.1 89.6  PLT 313 362 424* 414* 435*    Basic Metabolic Panel: Recent Labs  Lab 10/22/20 0158 10/23/20 0144 10/24/20 0341 10/25/20 0154 10/26/20 0138  NA 132* 131* 129* 130* 132*  K 3.9 3.9 4.3 4.3 3.6  CL 93* 94* 93* 92* 93*  CO2 '24 27 25 26 28  '$ GLUCOSE 97 96 109* 97 116*  BUN 41* 21* 38* 48* 34*  CREATININE 6.31* 4.44* 6.56* 8.37* 6.03*  CALCIUM 8.2* 8.1* 8.4* 8.2* 8.7*  PHOS 5.8* 3.8 4.8* 5.2* 4.7*    GFR: Estimated Creatinine Clearance: 16 mL/min (A) (by C-G formula based on SCr of 6.03 mg/dL (H)).  Liver Function Tests: Recent Labs  Lab 10/22/20 0158 10/23/20 0144 10/24/20 0341 10/25/20 0154 10/26/20 0138  ALBUMIN 2.1* 2.1* 2.1* 2.0* 2.1*    CBG: No results for input(s): GLUCAP in the last 168 hours.   Recent Results (from the past 240 hour(s))   Aerobic/Anaerobic Culture w Gram Stain (surgical/deep wound)     Status: None   Collection Time: 10/20/20  4:12 PM   Specimen: Soft Tissue, Other  Result Value Ref Range Status   Specimen Description TISSUE  Final   Special Requests RIGHT LEG ABSC SPEC A  Final   Gram Stain   Final    RARE WBC PRESENT,BOTH PMN AND MONONUCLEAR NO ORGANISMS SEEN    Culture   Final    FEW METHICILLIN RESISTANT STAPHYLOCOCCUS AUREUS CRITICAL RESULT CALLED TO, READ BACK BY AND VERIFIED WITH: RN [email or web address] AT [id number] ON 10/22/2020 BY T.SAAD  NO ANAEROBES ISOLATED Performed at [facility], [place] [address] [place], [place] [address]    Report Status 10/26/2020 FINAL  Final   Organism ID, Bacteria METHICILLIN RESISTANT STAPHYLOCOCCUS AUREUS  Final      Susceptibility   Methicillin resistant staphylococcus aureus - MIC*    CIPROFLOXACIN >=8 RESISTANT Resistant     ERYTHROMYCIN >=8 RESISTANT Resistant     GENTAMICIN <=0.5 SENSITIVE Sensitive     OXACILLIN >=4 RESISTANT Resistant     TETRACYCLINE <=1 SENSITIVE Sensitive     VANCOMYCIN 1 SENSITIVE Sensitive     TRIMETH/SULFA <=10 SENSITIVE Sensitive     CLINDAMYCIN <=0.25 SENSITIVE Sensitive     RIFAMPIN <=0.5 SENSITIVE Sensitive     Inducible Clindamycin NEGATIVE Sensitive     * FEW METHICILLIN RESISTANT STAPHYLOCOCCUS AUREUS  Fungus Stain     Status: None   Collection Time: 10/20/20  4:12 PM   Specimen: Soft Tissue, Other  Result Value Ref Range Status   FUNGUS STAIN Final report  Final    Comment: (NOTE) Performed At: [facility] [address] [place], [place] [id number] Rush Farmer MD [id number]    Fungal Source TISSUE  Final    Comment: RIGHT LEG ABSC SPEC A Performed at [facility], [place] [address], [place], [place] [address]   Fungal Stain reflex     Status: None   Collection Time: 10/20/20  4:12 PM  Result Value Ref Range Status   Fungal stain result 1 Comment  Final    Comment: (NOTE) KOH/Calcofluor preparation:   no fungus observed. Performed At: [facility] [place], [place] [id number] Rush Farmer MD [id number]          Radiology Studies: No results found.      Scheduled Meds: . (feeding supplement) PROSource Plus  30 mL Oral BID BM  . aspirin EC  325 mg Oral Daily  . calcium acetate  667 mg Oral TID WC  . Chlorhexidine Gluconate Cloth  6 each Topical Q0600  . Chlorhexidine Gluconate Cloth  6 each Topical Q0600  . cinacalcet  30 mg Oral Q breakfast  . cloNIDine  0.3 mg Transdermal Weekly  . [START ON 10/29/2020] darbepoetin (ARANESP) injection - DIALYSIS  60 mcg Intravenous Q Fri-HD  . doxercalciferol  3 mcg Intravenous Q M,W,F-HD  . enoxaparin (LOVENOX) injection  30 mg Subcutaneous Q48H  . feeding supplement (NEPRO CARB STEADY)  237 mL Oral TID BM  . gabapentin  200 mg Oral QHS  . influenza vac split quadrivalent PF  0.5 mL Intramuscular Tomorrow-1000  . metoprolol tartrate  50 mg Oral BID  . multivitamin  1 tablet Oral QHS  . oxymetazoline  1 spray Each Nare BID  . pantoprazole  40 mg Oral Daily  . senna-docusate  1 tablet Oral BID  . sodium chloride flush  3 mL Intravenous Q12H   Continuous Infusions: . sodium chloride    . sodium chloride    . sodium chloride    . vancomycin 500 mg (10/22/20 1627)     LOS: 29 days    Time spent: 35 minutes    Irine Seal, MD [facility]   To contact the attending provider between 7A-7P or the covering provider during after hours 7P-7A, please log into the web site [email or web address] and access using universal [place] password for that web site. If you do not have the password, please call the hospital operator.  10/26/2020, 11:34 AM

## 2020-10-26 NOTE — Progress Notes (Signed)
Pharmacy Antibiotic Note  Patrick Anthony is a 50 y.o. male admitted on 09/27/2020 with fever, chills, and generalized weakness.  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  Continues on Vancomycin per pharmacy.  Pt has ESRD with HD MWF. Plans are for 6 weeks of antibiotics with day 1 being post-op day 1 from valve surgery per 3/7 ID note  3/10 > (4/21). Dr. Sharol Given recommends amputation of R tibia - patient to decide by end of week if he would like to proceed.  Last vancomycin random 3/16 was at goal at 20. WBC 11.5, afebrile. Last HD 3/21. Will plan to check pre-HD vancomycin random tomorrow.   Plan: -Continue vancomycin 500 mg IV qHD  -Continue following HD schedule/duration -F/u Pre-HD VR on 3/23  Height: 6' 0.01" (182.9 cm) Weight: 76.1 kg (167 lb 11.2 oz) IBW/kg (Calculated) : 77.62  Temp (24hrs), Avg:98.3 F (36.8 C), Min:97.7 F (36.5 C), Max:99.5 F (37.5 C)  Recent Labs  Lab 10/20/20 0148 10/20/20 1937 10/22/20 0158 10/23/20 0144 10/24/20 0341 10/25/20 0154 10/26/20 0138  WBC 11.3*   < > 12.0* 12.7* 11.9* 12.5* 11.5*  CREATININE 6.67*   < > 6.31* 4.44* 6.56* 8.37* 6.03*  VANCORANDOM 20  --   --   --   --   --   --    < > = values in this interval not displayed.    Estimated Creatinine Clearance: 16 mL/min (A) (by C-G formula based on SCr of 6.03 mg/dL (H)).    No Known Allergies  Antimicrobials this admission: Vanc 2/21>> Cefepime 2/21>>2/22 Flagyl 2/21>>2/22 (2 doses given)  Dose adjustments this admission: 2/28 Pre-HD VR 41 - estimate post HD level 27 Dose held 2/28 and reduced to '750mg'$  IV qHD starting 3/2 3/7 pre HD VR= 29; change vanc to '500mg'$  w/ HD 3/13 Post-HD VR 29 - no additional dose (did get additional dose of vanc around time of surgery) 3/16 Pre-HD VR 20 - continue with 500 IV qHD  Microbiology results: 2/21 blood x 2: MRSA, sens Vanc MIC < 0.5, Septra, TCN, gent MIC < 0.5, Clinda 2/22 BCID: MRSA  2/21 COVID and  flu: negative 2/21 HIV: non-reactive 2/24 blood x 2: 1/2 SA 2/27 blood x 2: neg  Dimple Nanas, PharmD PGY-1 Acute Care Pharmacy Resident 10/26/2020 11:06 AM   Please refer to [facility] for unit-specific pharmacist

## 2020-10-26 NOTE — Progress Notes (Signed)
Patient ID: Patrick Anthony, male   DOB: 10/11/1970, 50 y.o.   MRN: [id number] Patient is seen in follow-up for chronic osteomyelitis of the right tibia.  We reviewed his medical records. Confirmed that 4 different medical specialist have recommended proceeding with an amputation of the right leg for the osteomyelitis of the tibia.  Reviewed that his heart infection is secondary to the chronic osteomyelitis of the tibia and without removal of the source of infection he will reinfect his heart.  Patient states he understands the process he understands that 4 physicians have recommended proceeding with amputation but he states he wants to get the medical opinion from his primary care physician outside the hospital.  He states he wants to get the opinion from a non-[facility] doctor.  He states if he can go home today he will let me know by Thursday his decision regarding surgery.  I reviewed that the wound is packed open to allow the infection to drain to decrease risk of further systemic infection.

## 2020-10-26 NOTE — Progress Notes (Signed)
Subjective: Patient still not able to come to terms with the idea of having an above-the-knee amputation   Antibiotics:  Anti-infectives (From admission, onward)   Start     Dose/Rate Route Frequency Ordered Stop   10/25/20 0922  vancomycin (VANCOCIN) 500-5 MG/100ML-% IVPB       Note to Pharmacy: Kalman Shan   : cabinet override      10/25/20 0922 10/25/20 0958   10/21/20 0600  ceFAZolin (ANCEF) IVPB 2g/100 mL premix        2 g 200 mL/hr over 30 Minutes Intravenous On call to O.R. 10/20/20 1349 10/21/20 0650   10/20/20 2243  vancomycin (VANCOCIN) 500-5 MG/100ML-% IVPB       Note to Pharmacy: Wallace Cullens   : cabinet override      10/20/20 2243 10/20/20 2243   10/20/20 1437  ceFAZolin (ANCEF) 2-4 GM/100ML-% IVPB       Note to Pharmacy: Ladoris Gene   : cabinet override      10/20/20 1437 10/20/20 1612   10/20/20 1200  vancomycin (VANCOREADY) IVPB 500 mg/100 mL        500 mg 100 mL/hr over 60 Minutes Intravenous Every M-W-F (Hemodialysis) 10/20/20 0903     10/18/20 1124  vancomycin variable dose per unstable renal function (pharmacist dosing)  Status:  Discontinued         Does not apply See admin instructions 10/18/20 1124 10/20/20 0903   10/16/20 1600  vancomycin (VANCOREADY) IVPB 500 mg/100 mL  Status:  Discontinued        500 mg 100 mL/hr over 60 Minutes Intravenous Every Sat (Hemodialysis) 10/16/20 1306 10/16/20 1312   10/15/20 1500  vancomycin (VANCOCIN) IVPB 500 mg/100 ml premix  Status:  Discontinued        500 mg 100 mL/hr over 60 Minutes Intravenous Every M-W-F (Hemodialysis) 10/15/20 1357 10/17/20 0942   10/14/20 2130  vancomycin (VANCOCIN) IVPB 1000 mg/200 mL premix  Status:  Discontinued        1,000 mg 200 mL/hr over 60 Minutes Intravenous  Once 10/14/20 1403 10/14/20 1547   10/14/20 2130  vancomycin (VANCOREADY) IVPB 500 mg/100 mL        500 mg 100 mL/hr over 60 Minutes Intravenous  Once 10/14/20 1547 10/14/20 2222   10/14/20 1730  cefUROXime  (ZINACEF) 1.5 g in sodium chloride 0.9 % 100 mL IVPB        1.5 g 200 mL/hr over 30 Minutes Intravenous Every 24 hr x 2 10/14/20 1403 10/15/20 1718   10/14/20 1128  vancomycin (VANCOCIN) 1,000 mg in sodium chloride 0.9 % 1,000 mL irrigation  Status:  Discontinued          As needed 10/14/20 1128 10/14/20 1401   10/14/20 0400  vancomycin (VANCOREADY) IVPB 1250 mg/250 mL        1,250 mg 166.7 mL/hr over 90 Minutes Intravenous To Surgery 10/13/20 1039 10/14/20 0910   10/14/20 0400  cefUROXime (ZINACEF) 1.5 g in sodium chloride 0.9 % 100 mL IVPB        1.5 g 200 mL/hr over 30 Minutes Intravenous To Surgery 10/13/20 1039 10/14/20 0835   10/14/20 0400  cefUROXime (ZINACEF) 750 mg in sodium chloride 0.9 % 100 mL IVPB        750 mg 200 mL/hr over 30 Minutes Intravenous To Surgery 10/13/20 1039 10/14/20 1250   10/14/20 0400  vancomycin (VANCOCIN) 1,000 mg in sodium chloride 0.9 % 1,000 mL irrigation  Status:  Discontinued         Irrigation To Surgery 10/13/20 1039 10/14/20 1403   10/13/20 1657  vancomycin (VANCOCIN) 500-5 MG/100ML-% IVPB       Note to Pharmacy: Judieth Keens  : cabinet override      10/13/20 1657 10/13/20 1717   10/12/20 0600  ceFAZolin (ANCEF) IVPB 2g/100 mL premix        2 g 200 mL/hr over 30 Minutes Intravenous On call to O.R. 10/11/20 1841 10/12/20 1315   10/11/20 1415  vancomycin (VANCOCIN) IVPB 500 mg/100 ml premix  Status:  Discontinued        500 mg 100 mL/hr over 60 Minutes Intravenous Every M-W-F (Hemodialysis) 10/11/20 1413 10/14/20 1403   10/11/20 1413  vancomycin (VANCOCIN) 500-5 MG/100ML-% IVPB       Note to Pharmacy: Kalman Shan   : cabinet override      10/11/20 1413 10/12/20 0229   10/11/20 1353  Vancomycin (VANCOCIN) 750-5 MG/150ML-% IVPB  Status:  Discontinued       Note to Pharmacy: Kalman Shan   : cabinet override      10/11/20 1353 10/11/20 1417   10/08/20 1200  vancomycin (VANCOCIN) IVPB 750 mg/150 ml premix  Status:  Discontinued        750  mg 150 mL/hr over 60 Minutes Intravenous Every M-W-F (Hemodialysis) 10/05/20 1404 10/11/20 1413   10/06/20 1619  vancomycin (VANCOCIN) 500-5 MG/100ML-% IVPB       Note to Pharmacy: Judieth Keens  : cabinet override      10/06/20 1619 10/06/20 1736   10/06/20 1200  vancomycin (VANCOCIN) IVPB 750 mg/150 ml premix  Status:  Discontinued        750 mg 150 mL/hr over 60 Minutes Intravenous Every M-W-F (Hemodialysis) 10/04/20 0905 10/05/20 1404   10/06/20 1200  vancomycin (VANCOCIN) IVPB 500 mg/100 ml premix        500 mg 100 mL/hr over 60 Minutes Intravenous Every Wed (Hemodialysis) 10/05/20 1404 10/06/20 2000   10/01/20 2000  DAPTOmycin (CUBICIN) 840 mg in sodium chloride 0.9 % IVPB  Status:  Discontinued        840 mg 233.6 mL/hr over 30 Minutes Intravenous Every 48 hours 10/01/20 0922 10/01/20 1056   10/01/20 1200  vancomycin (VANCOCIN) IVPB 1000 mg/200 mL premix  Status:  Discontinued        1,000 mg 200 mL/hr over 60 Minutes Intravenous Every M-W-F (Hemodialysis) 10/01/20 1056 10/04/20 0845   09/29/20 1200  vancomycin (VANCOCIN) IVPB 1000 mg/200 mL premix  Status:  Discontinued        1,000 mg 200 mL/hr over 60 Minutes Intravenous Every M-W-F (Hemodialysis) 09/27/20 1443 10/01/20 0922   09/29/20 1200  ceFEPIme (MAXIPIME) 2 g in sodium chloride 0.9 % 100 mL IVPB  Status:  Discontinued        2 g 200 mL/hr over 30 Minutes Intravenous Every M-W-F (Hemodialysis) 09/27/20 1443 09/28/20 1054   09/28/20 0900  metroNIDAZOLE (FLAGYL) IVPB 500 mg  Status:  Discontinued        500 mg 100 mL/hr over 60 Minutes Intravenous Every 12 hours 09/27/20 1808 09/28/20 1054   09/28/20 0900  ceFEPIme (MAXIPIME) 1 g in sodium chloride 0.9 % 100 mL IVPB  Status:  Discontinued        1 g 200 mL/hr over 30 Minutes Intravenous Every 12 hours 09/27/20 1808 09/27/20 1812   09/27/20 1445  vancomycin (VANCOREADY) IVPB 2000 mg/400 mL        2,000 mg  200 mL/hr over 120 Minutes Intravenous  Once 09/27/20 1433  09/27/20 2330   09/27/20 1430  ceFEPIme (MAXIPIME) 2 g in sodium chloride 0.9 % 100 mL IVPB        2 g 200 mL/hr over 30 Minutes Intravenous  Once 09/27/20 1418 09/27/20 1541   09/27/20 1430  metroNIDAZOLE (FLAGYL) IVPB 500 mg        500 mg 100 mL/hr over 60 Minutes Intravenous  Once 09/27/20 1418 09/27/20 2214   09/27/20 1430  vancomycin (VANCOREADY) IVPB 1750 mg/350 mL  Status:  Discontinued        1,750 mg 175 mL/hr over 120 Minutes Intravenous  Once 09/27/20 1418 09/27/20 1433      Medications: Scheduled Meds: . (feeding supplement) PROSource Plus  30 mL Oral BID BM  . aspirin EC  325 mg Oral Daily  . calcium acetate  667 mg Oral TID WC  . Chlorhexidine Gluconate Cloth  6 each Topical Q0600  . Chlorhexidine Gluconate Cloth  6 each Topical Q0600  . cinacalcet  30 mg Oral Q breakfast  . cloNIDine  0.3 mg Transdermal Weekly  . [START ON 10/29/2020] darbepoetin (ARANESP) injection - DIALYSIS  60 mcg Intravenous Q Fri-HD  . doxercalciferol  3 mcg Intravenous Q M,W,F-HD  . enoxaparin (LOVENOX) injection  30 mg Subcutaneous Q48H  . feeding supplement (NEPRO CARB STEADY)  237 mL Oral TID BM  . gabapentin  200 mg Oral QHS  . influenza vac split quadrivalent PF  0.5 mL Intramuscular Tomorrow-1000  . metoprolol tartrate  50 mg Oral BID  . multivitamin  1 tablet Oral QHS  . oxymetazoline  1 spray Each Nare BID  . pantoprazole  40 mg Oral Daily  . senna-docusate  1 tablet Oral BID  . sodium chloride flush  3 mL Intravenous Q12H   Continuous Infusions: . sodium chloride    . sodium chloride    . sodium chloride    . vancomycin 500 mg (10/22/20 1627)   PRN Meds:.sodium chloride, sodium chloride, sodium chloride, acetaminophen, camphor-menthol, diphenhydrAMINE, HYDROmorphone, ondansetron **OR** ondansetron (ZOFRAN) IV, oxyCODONE, pentafluoroprop-tetrafluoroeth, sodium chloride flush    Objective: Weight change: -0.019 kg No intake or output data in the 24 hours ending 10/26/20  1358 Blood pressure (!) 121/94, pulse 80, temperature 97.7 F (36.5 C), temperature source Oral, resp. rate 19, height 6' 0.01" (1.829 m), weight 76.1 kg, SpO2 100 %. Temp:  [97.7 F (36.5 C)-99.5 F (37.5 C)] 97.7 F (36.5 C) (03/22 1135) Pulse Rate:  [78-97] 80 (03/22 1135) Resp:  [17-19] 19 (03/22 1135) BP: (98-124)/(69-94) 121/94 (03/22 1135) SpO2:  [94 %-100 %] 100 % (03/22 1135) Weight:  [76.1 kg] 76.1 kg (03/22 0345)  Physical Exam: Physical Exam Constitutional:      Appearance: He is well-developed.  HENT:     Head: Normocephalic and atraumatic.  Eyes:     Conjunctiva/sclera: Conjunctivae normal.  Cardiovascular:     Rate and Rhythm: Normal rate and regular rhythm.  Pulmonary:     Effort: Pulmonary effort is normal. No respiratory distress.     Breath sounds: No wheezing.  Abdominal:     General: There is no distension.     Palpations: Abdomen is soft.  Musculoskeletal:        General: Normal range of motion.     Cervical back: Normal range of motion and neck supple.  Skin:    General: Skin is warm and dry.     Findings: No erythema or rash.  Neurological:     Mental Status: He is alert and oriented to person, place, and time.  Psychiatric:        Attention and Perception: Attention normal.        Mood and Affect: Mood is depressed.        Behavior: Behavior normal.        Thought Content: Thought content normal.        Judgment: Judgment normal.     Right  leg wrapped  CBC:    BMET Recent Labs    10/25/20 0154 10/26/20 0138  NA 130* 132*  K 4.3 3.6  CL 92* 93*  CO2 26 28  GLUCOSE 97 116*  BUN 48* 34*  CREATININE 8.37* 6.03*  CALCIUM 8.2* 8.7*     Liver Panel  Recent Labs    10/25/20 0154 10/26/20 0138  ALBUMIN 2.0* 2.1*       Sedimentation Rate No results for input(s): ESRSEDRATE in the last 72 hours. C-Reactive Protein No results for input(s): CRP in the last 72 hours.  Micro Results: Recent Results (from the past 720  hour(s))  Blood Culture (routine x 2)     Status: Abnormal   Collection Time: 09/27/20  2:16 PM   Specimen: BLOOD RIGHT HAND  Result Value Ref Range Status   Specimen Description BLOOD RIGHT HAND  Final   Special Requests   Final    BOTTLES DRAWN AEROBIC AND ANAEROBIC Blood Culture results may not be optimal due to an inadequate volume of blood received in culture bottles Performed at [facility] [address] [place], [place] [address]    Culture  Setup Time   Final    GRAM POSITIVE COCCI IN CLUSTERS IN BOTH AEROBIC AND ANAEROBIC BOTTLES    Culture METHICILLIN RESISTANT STAPHYLOCOCCUS AUREUS (A)  Final   Report Status 09/30/2020 FINAL  Final   Organism ID, Bacteria METHICILLIN RESISTANT STAPHYLOCOCCUS AUREUS  Final      Susceptibility   Methicillin resistant staphylococcus aureus - MIC*    CIPROFLOXACIN >=8 RESISTANT Resistant     ERYTHROMYCIN >=8 RESISTANT Resistant     GENTAMICIN <=0.5 SENSITIVE Sensitive     OXACILLIN >=4 RESISTANT Resistant     TETRACYCLINE <=1 SENSITIVE Sensitive     VANCOMYCIN <=0.5 SENSITIVE Sensitive     TRIMETH/SULFA <=10 SENSITIVE Sensitive     CLINDAMYCIN <=0.25 SENSITIVE Sensitive     RIFAMPIN <=0.5 SENSITIVE Sensitive     Inducible Clindamycin NEGATIVE Sensitive     * METHICILLIN RESISTANT STAPHYLOCOCCUS AUREUS  Resp Panel by RT-PCR (Flu A&B, Covid) Nasopharyngeal Swab     Status: None   Collection Time: 09/27/20  2:16 PM   Specimen: Nasopharyngeal Swab; Nasopharyngeal(NP) swabs in vial transport medium  Result Value Ref Range Status   SARS Coronavirus 2 by RT PCR NEGATIVE NEGATIVE Final    Comment: (NOTE) SARS-CoV-2 target nucleic acids are NOT DETECTED.  The SARS-CoV-2 RNA is generally detectable in upper respiratory specimens during the acute phase of infection. The lowest concentration of SARS-CoV-2 viral copies this assay can detect is 138 copies/mL. A negative result does not preclude SARS-Cov-2 infection and should not be used  as the sole basis for treatment or other patient management decisions. A negative result may occur with  improper specimen collection/handling, submission of specimen other than nasopharyngeal swab, presence of viral mutation(s) within the areas targeted by this assay, and inadequate number of viral copies(<138 copies/mL). A negative result must be combined with clinical observations, patient  history, and epidemiological information. The expected result is Negative.  Fact Sheet for Patients:  EntrepreneurPulse.com.au  Fact Sheet for Healthcare Providers:  IncredibleEmployment.be  This test is no t yet approved or cleared by the [place] FDA and  has been authorized for detection and/or diagnosis of SARS-CoV-2 by FDA under an Emergency Use Authorization (EUA). This EUA will remain  in effect (meaning this test can be used) for the duration of the COVID-19 declaration under Section 564(b)(1) of the Act, 21 U.S.C.section 360bbb-3(b)(1), unless the authorization is terminated  or revoked sooner.       Influenza A by PCR NEGATIVE NEGATIVE Final   Influenza B by PCR NEGATIVE NEGATIVE Final    Comment: (NOTE) The Xpert Xpress SARS-CoV-2/FLU/RSV plus assay is intended as an aid in the diagnosis of influenza from Nasopharyngeal swab specimens and should not be used as a sole basis for treatment. Nasal washings and aspirates are unacceptable for Xpert Xpress SARS-CoV-2/FLU/RSV testing.  Fact Sheet for Patients: EntrepreneurPulse.com.au  Fact Sheet for Healthcare Providers: IncredibleEmployment.be  This test is not yet approved or cleared by the [place] FDA and has been authorized for detection and/or diagnosis of SARS-CoV-2 by FDA under an Emergency Use Authorization (EUA). This EUA will remain in effect (meaning this test can be used) for the duration of the COVID-19 declaration under Section 564(b)(1)  of the Act, 21 U.S.C. section 360bbb-3(b)(1), unless the authorization is terminated or revoked.  Performed at [facility], [place] [address], [place], [place] [address]   Blood Culture ID Panel (Reflexed)     Status: Abnormal   Collection Time: 09/27/20  2:16 PM  Result Value Ref Range Status   Enterococcus faecalis NOT DETECTED NOT DETECTED Final   Enterococcus Faecium NOT DETECTED NOT DETECTED Final   Listeria monocytogenes NOT DETECTED NOT DETECTED Final   Staphylococcus species DETECTED (A) NOT DETECTED Final    Comment: CRITICAL RESULT CALLED TO, READ BACK BY AND VERIFIED WITH: C,PIERCE PHARMD '@1009'$  09/28/20 EB    Staphylococcus aureus (BCID) DETECTED (A) NOT DETECTED Final    Comment: Methicillin (oxacillin)-resistant Staphylococcus aureus (MRSA). MRSA is predictably resistant to beta-lactam antibiotics (except ceftaroline). Preferred therapy is vancomycin unless clinically contraindicated. Patient requires contact precautions if  hospitalized. CRITICAL RESULT CALLED TO, READ BACK BY AND VERIFIED WITH: C,PIERCE PHARMD '@1009'$  09/28/20 EB    Staphylococcus epidermidis NOT DETECTED NOT DETECTED Final   Staphylococcus lugdunensis NOT DETECTED NOT DETECTED Final   Streptococcus species NOT DETECTED NOT DETECTED Final   Streptococcus agalactiae NOT DETECTED NOT DETECTED Final   Streptococcus pneumoniae NOT DETECTED NOT DETECTED Final   Streptococcus pyogenes NOT DETECTED NOT DETECTED Final   A.calcoaceticus-baumannii NOT DETECTED NOT DETECTED Final   Bacteroides fragilis NOT DETECTED NOT DETECTED Final   Enterobacterales NOT DETECTED NOT DETECTED Final   Enterobacter cloacae complex NOT DETECTED NOT DETECTED Final   Escherichia coli NOT DETECTED NOT DETECTED Final   Klebsiella aerogenes NOT DETECTED NOT DETECTED Final   Klebsiella oxytoca NOT DETECTED NOT DETECTED Final   Klebsiella pneumoniae NOT DETECTED NOT DETECTED Final   Proteus species NOT DETECTED NOT DETECTED Final    Salmonella species NOT DETECTED NOT DETECTED Final   Serratia marcescens NOT DETECTED NOT DETECTED Final   Haemophilus influenzae NOT DETECTED NOT DETECTED Final   Neisseria meningitidis NOT DETECTED NOT DETECTED Final   Pseudomonas aeruginosa NOT DETECTED NOT DETECTED Final   Stenotrophomonas maltophilia NOT DETECTED NOT DETECTED Final   Candida albicans NOT DETECTED NOT DETECTED Final  Candida auris NOT DETECTED NOT DETECTED Final   Candida glabrata NOT DETECTED NOT DETECTED Final   Candida krusei NOT DETECTED NOT DETECTED Final   Candida parapsilosis NOT DETECTED NOT DETECTED Final   Candida tropicalis NOT DETECTED NOT DETECTED Final   Cryptococcus neoformans/gattii NOT DETECTED NOT DETECTED Final   Meth resistant mecA/C and MREJ DETECTED (A) NOT DETECTED Final    Comment: CRITICAL RESULT CALLED TO, READ BACK BY AND VERIFIED WITH: C,PIERCE PHARMD '@1009'$  09/28/20 EB Performed at [facility] [address], [place], [place] [address]   Blood Culture (routine x 2)     Status: Abnormal   Collection Time: 09/27/20  2:29 PM   Specimen: BLOOD  Result Value Ref Range Status   Specimen Description BLOOD SITE NOT SPECIFIED  Final   Special Requests   Final    BOTTLES DRAWN AEROBIC AND ANAEROBIC Blood Culture results may not be optimal due to an inadequate volume of blood received in culture bottles   Culture  Setup Time   Final    GRAM POSITIVE COCCI IN CLUSTERS ANAEROBIC BOTTLE ONLY CRITICAL VALUE NOTED.  VALUE IS CONSISTENT WITH PREVIOUSLY REPORTED AND CALLED VALUE.    Culture (A)  Final    STAPHYLOCOCCUS AUREUS SUSCEPTIBILITIES PERFORMED ON PREVIOUS CULTURE WITHIN THE LAST 5 DAYS. Performed at [facility], [place] [address], [place], [place] [address]    Report Status 10/01/2020 FINAL  Final  Culture, blood (routine x 2)     Status: None   Collection Time: 09/30/20  6:04 AM   Specimen: BLOOD RIGHT FOREARM  Result Value Ref Range Status   Specimen Description BLOOD RIGHT  FOREARM  Final   Special Requests   Final    BOTTLES DRAWN AEROBIC AND ANAEROBIC Blood Culture adequate volume   Culture   Final    NO GROWTH 5 DAYS Performed at [facility], [place] [address], [place], [place] [address]    Report Status 10/05/2020 FINAL  Final  Culture, blood (routine x 2)     Status: Abnormal   Collection Time: 09/30/20  6:08 AM   Specimen: BLOOD RIGHT HAND  Result Value Ref Range Status   Specimen Description BLOOD RIGHT HAND  Final   Special Requests   Final    BOTTLES DRAWN AEROBIC AND ANAEROBIC Blood Culture results may not be optimal due to an inadequate volume of blood received in culture bottles   Culture  Setup Time   Final    GRAM POSITIVE COCCI IN CLUSTERS ANAEROBIC BOTTLE ONLY CRITICAL VALUE NOTED.  VALUE IS CONSISTENT WITH PREVIOUSLY REPORTED AND CALLED VALUE.    Culture (A)  Final    STAPHYLOCOCCUS AUREUS SUSCEPTIBILITIES PERFORMED ON PREVIOUS CULTURE WITHIN THE LAST 5 DAYS. Performed at [facility], [place] [address] [place], [place] [address]    Report Status 10/03/2020 FINAL  Final  Culture, blood (routine x 2)     Status: None   Collection Time: 10/03/20  2:42 AM   Specimen: BLOOD  Result Value Ref Range Status   Specimen Description BLOOD LEFT ARM  Final   Special Requests   Final    BOTTLES DRAWN AEROBIC AND ANAEROBIC Blood Culture results may not be optimal due to an inadequate volume of blood received in culture bottles   Culture   Final    NO GROWTH 5 DAYS Performed at [facility], [place] [address] [place],  [address]    Report Status 10/08/2020 FINAL  Final  Culture, blood (routine x 2)     Status: None   Collection Time: 10/03/20  2:42 AM   Specimen: BLOOD  Result Value Ref Range Status   Specimen Description BLOOD LEFT HAND  Final   Special Requests   Final    BOTTLES DRAWN AEROBIC ONLY Blood Culture results may not be optimal due to an inadequate volume of blood received in culture bottles   Culture    Final    NO GROWTH 5 DAYS Performed at [facility], [address] [place], [place] [address]    Report Status 10/08/2020 FINAL  Final  Surgical pcr screen     Status: Abnormal   Collection Time: 10/11/20  6:44 PM   Specimen: Nasal Mucosa; Nasal Swab  Result Value Ref Range Status   MRSA, PCR POSITIVE (A) NEGATIVE Final    Comment: RESULT CALLED TO, READ BACK BY AND VERIFIED WITH: F COMBRES RN 10/11/20 2339 JDW    Staphylococcus aureus POSITIVE (A) NEGATIVE Final    Comment: (NOTE) The Xpert SA Assay (FDA approved for NASAL specimens in patients 104 years of age and older), is one component of a comprehensive surveillance program. It is not intended to diagnose infection nor to guide or monitor treatment. Performed at [facility], [place] [address] [place], [place] [address]   Culture, blood (routine x 2)     Status: None   Collection Time: 10/13/20 10:43 PM   Specimen: BLOOD  Result Value Ref Range Status   Specimen Description BLOOD RIGHT THUMB  Final   Special Requests   Final    BOTTLES DRAWN AEROBIC AND ANAEROBIC Blood Culture adequate volume   Culture   Final    NO GROWTH 5 DAYS Performed at [facility], [place] [address] [place], [place] [address]    Report Status 10/19/2020 FINAL  Final  Culture, blood (routine x 2)     Status: None   Collection Time: 10/13/20 10:43 PM   Specimen: BLOOD RIGHT HAND  Result Value Ref Range Status   Specimen Description BLOOD RIGHT HAND  Final   Special Requests   Final    BOTTLES DRAWN AEROBIC AND ANAEROBIC Blood Culture adequate volume   Culture   Final    NO GROWTH 5 DAYS Performed at [facility], [place] [address] [place], [place] [address]    Report Status 10/19/2020 FINAL  Final  Aerobic/Anaerobic Culture w Gram Stain (surgical/deep wound)     Status: None   Collection Time: 10/14/20 10:38 AM   Specimen: Soft Tissue, Other  Result Value Ref Range Status   Specimen Description TISSUE MITRAL VALVE  VEGETATION SPEC A  Final   Special Requests MITRAL VALVE VEGETATION  Final   Gram Stain   Final    WBC PRESENT,BOTH PMN AND MONONUCLEAR NO ORGANISMS SEEN    Culture   Final    No growth aerobically or anaerobically. Performed at [facility], [place] [address], [place],  [address]    Report Status 10/19/2020 FINAL  Final  Aerobic/Anaerobic Culture w Gram Stain (surgical/deep wound)     Status: None   Collection Time: 10/20/20  4:12 PM   Specimen: Soft Tissue, Other  Result Value Ref Range Status   Specimen Description TISSUE  Final   Special Requests RIGHT LEG ABSC SPEC A  Final   Gram Stain   Final    RARE WBC PRESENT,BOTH PMN AND MONONUCLEAR NO ORGANISMS SEEN  Culture   Final    FEW METHICILLIN RESISTANT STAPHYLOCOCCUS AUREUS CRITICAL RESULT CALLED TO, READ BACK BY AND VERIFIED WITH: RN N.[email or web address] AT [id number] ON 10/22/2020 BY T.SAAD NO ANAEROBES ISOLATED Performed at [facility], [address], [place], [place] [address]    Report Status 10/26/2020 FINAL  Final   Organism ID, Bacteria METHICILLIN RESISTANT STAPHYLOCOCCUS AUREUS  Final      Susceptibility   Methicillin resistant staphylococcus aureus - MIC*    CIPROFLOXACIN >=8 RESISTANT Resistant     ERYTHROMYCIN >=8 RESISTANT Resistant     GENTAMICIN <=0.5 SENSITIVE Sensitive     OXACILLIN >=4 RESISTANT Resistant     TETRACYCLINE <=1 SENSITIVE Sensitive     VANCOMYCIN 1 SENSITIVE Sensitive     TRIMETH/SULFA <=10 SENSITIVE Sensitive     CLINDAMYCIN <=0.25 SENSITIVE Sensitive     RIFAMPIN <=0.5 SENSITIVE Sensitive     Inducible Clindamycin NEGATIVE Sensitive     * FEW METHICILLIN RESISTANT STAPHYLOCOCCUS AUREUS  Fungus Stain     Status: None   Collection Time: 10/20/20  4:12 PM   Specimen: Soft Tissue, Other  Result Value Ref Range Status   FUNGUS STAIN Final report  Final    Comment: (NOTE) Performed At: [facility] [address] [place], [place] [id number] Rush Farmer MD [id number]     Fungal Source TISSUE  Final    Comment: RIGHT LEG ABSC SPEC A Performed at [facility], [place] [address] [place], [place] [address]   Fungal Stain reflex     Status: None   Collection Time: 10/20/20  4:12 PM  Result Value Ref Range Status   Fungal stain result 1 Comment  Final    Comment: (NOTE) KOH/Calcofluor preparation:  no fungus observed. Performed At: [facility] [place], [place] [id number] Rush Farmer MD [id number]     Studies/Results: No results found.    Assessment/Plan:  INTERVAL HISTORY: Patient still is not given consent to surgery   Principal Problem:   Bacterial endocarditis Active Problems:   Chronic combined systolic and diastolic congestive heart failure (HCC)   HTN (hypertension)   Anemia in chronic kidney disease   Hepatitis C   ESRD on dialysis ([place])   End stage renal disease (HCC)   Chest pain   Sepsis ([place])   Pneumonia due to infectious agent   ADPKD (autosomal dominant polycystic kidney disease)   Sepsis due to pneumonia (HCC)   Lactic acidosis   Cerebral embolism with cerebral infarction   Bacteremia due to Staphylococcus aureus   Mitral regurgitation   Uncontrolled hypertension   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  He had extraction of multiple teeth by oral surgery.  On 10 March she went to the OR and underwent mitral valve replacement and closure of the  PFO.  In the interim he was found to have large " bumps" on his right leg  which he had not mentioned until after surgery.  He turned out to have large complicated abscesses with osteomyelitis involving his tibia with a 7 x 3 cm area of bone that was excised.  The patient needs an above-the-knee amputation but is still not consented to going forward with surgery.  He has been told by multiple providers multiple times that he needs an above-the-knee amputation to avoid seeding his new valve his graft and potentially dying from staph aureus bacteremia and sepsis.  I offered to have a chaplain see him vs palliative care and he wished to speak with palliative care.  I spent greater than 35 minutes with the patient including greater than 50% of time in face to face counsel of the patient guarding the need for AKA and in coordination of his care with Dr. Grandville Silos      LOS: 66 days   Rhina Brackett Dam 10/26/2020, 1:58 PM

## 2020-10-26 NOTE — Progress Notes (Signed)
PT Cancellation Note  Patient Details Name: Rafiq Oiler MRN: [id number] DOB: Jan 18, 1971   Cancelled Treatment:    Reason Eval/Treat Not Completed: (P) Pain limiting ability to participate (pt defer, reporting increased RLE pain.) Pt reports he just had pain meds and wants to rest. Encouraged to perform supine/seated exercises on LLE for strengthening during day. Will continue efforts per PT POC as schedule permits next date.   Kara Pacer Andersyn Fragoso 10/26/2020, 5:20 PM

## 2020-10-26 NOTE — Progress Notes (Signed)
Mobility Specialist - Progress Note   10/26/20 1254  Mobility  Activity Ambulated in hall  Level of Assistance Standby assist, set-up cues, supervision of patient - no hands on  Assistive Device Front wheel walker  Distance Ambulated (ft) 470 ft  Mobility Response Tolerated well  Mobility performed by Mobility specialist  $Mobility charge 1 Mobility   Pt c/o intermittent sharp pain in R LE while walking, otherwise asx. HR remained ~90 throughout. Pt to recliner after walk.   Pricilla Handler Mobility Specialist Mobility Specialist Phone: [phone]

## 2020-10-27 DIAGNOSIS — N186 End stage renal disease: Secondary | ICD-10-CM | POA: Diagnosis not present

## 2020-10-27 DIAGNOSIS — Z9889 Other specified postprocedural states: Secondary | ICD-10-CM | POA: Diagnosis not present

## 2020-10-27 DIAGNOSIS — M86461 Chronic osteomyelitis with draining sinus, right tibia and fibula: Secondary | ICD-10-CM | POA: Diagnosis not present

## 2020-10-27 DIAGNOSIS — Z992 Dependence on renal dialysis: Secondary | ICD-10-CM | POA: Diagnosis not present

## 2020-10-27 DIAGNOSIS — I5032 Chronic diastolic (congestive) heart failure: Secondary | ICD-10-CM | POA: Diagnosis not present

## 2020-10-27 DIAGNOSIS — I6310 Cerebral infarction due to embolism of unspecified precerebral artery: Secondary | ICD-10-CM | POA: Diagnosis not present

## 2020-10-27 DIAGNOSIS — I132 Hypertensive heart and chronic kidney disease with heart failure and with stage 5 chronic kidney disease, or end stage renal disease: Secondary | ICD-10-CM | POA: Diagnosis not present

## 2020-10-27 DIAGNOSIS — Q211 Atrial septal defect: Secondary | ICD-10-CM | POA: Diagnosis not present

## 2020-10-27 DIAGNOSIS — I5042 Chronic combined systolic (congestive) and diastolic (congestive) heart failure: Secondary | ICD-10-CM | POA: Diagnosis not present

## 2020-10-27 DIAGNOSIS — D631 Anemia in chronic kidney disease: Secondary | ICD-10-CM | POA: Diagnosis not present

## 2020-10-27 DIAGNOSIS — I639 Cerebral infarction, unspecified: Secondary | ICD-10-CM | POA: Diagnosis not present

## 2020-10-27 DIAGNOSIS — Q612 Polycystic kidney, adult type: Secondary | ICD-10-CM | POA: Diagnosis not present

## 2020-10-27 DIAGNOSIS — L0291 Cutaneous abscess, unspecified: Secondary | ICD-10-CM | POA: Diagnosis not present

## 2020-10-27 DIAGNOSIS — J984 Other disorders of lung: Secondary | ICD-10-CM | POA: Diagnosis not present

## 2020-10-27 DIAGNOSIS — R7881 Bacteremia: Secondary | ICD-10-CM | POA: Diagnosis not present

## 2020-10-27 DIAGNOSIS — N2581 Secondary hyperparathyroidism of renal origin: Secondary | ICD-10-CM | POA: Diagnosis not present

## 2020-10-27 DIAGNOSIS — A419 Sepsis, unspecified organism: Secondary | ICD-10-CM | POA: Diagnosis not present

## 2020-10-27 DIAGNOSIS — I330 Acute and subacute infective endocarditis: Secondary | ICD-10-CM | POA: Diagnosis not present

## 2020-10-27 DIAGNOSIS — L02415 Cutaneous abscess of right lower limb: Secondary | ICD-10-CM | POA: Diagnosis not present

## 2020-10-27 LAB — CBC WITH DIFFERENTIAL/PLATELET
Abs Immature Granulocytes: 0.12 10*3/uL — ABNORMAL HIGH (ref 0.00–0.07)
Basophils Absolute: 0 10*3/uL (ref 0.0–0.1)
Basophils Relative: 0 %
Eosinophils Absolute: 0.3 10*3/uL (ref 0.0–0.5)
Eosinophils Relative: 2 %
HCT: 27.4 % — ABNORMAL LOW (ref 39.0–52.0)
Hemoglobin: 8.9 g/dL — ABNORMAL LOW (ref 13.0–17.0)
Immature Granulocytes: 1 %
Lymphocytes Relative: 9 %
Lymphs Abs: 1.3 10*3/uL (ref 0.7–4.0)
MCH: 29.1 pg (ref 26.0–34.0)
MCHC: 32.5 g/dL (ref 30.0–36.0)
MCV: 89.5 fL (ref 80.0–100.0)
Monocytes Absolute: 1.9 10*3/uL — ABNORMAL HIGH (ref 0.1–1.0)
Monocytes Relative: 14 %
Neutro Abs: 10.6 10*3/uL — ABNORMAL HIGH (ref 1.7–7.7)
Neutrophils Relative %: 74 %
Platelets: 451 10*3/uL — ABNORMAL HIGH (ref 150–400)
RBC: 3.06 MIL/uL — ABNORMAL LOW (ref 4.22–5.81)
RDW: 17.8 % — ABNORMAL HIGH (ref 11.5–15.5)
WBC: 14.2 10*3/uL — ABNORMAL HIGH (ref 4.0–10.5)
nRBC: 0 % (ref 0.0–0.2)

## 2020-10-27 LAB — VANCOMYCIN, RANDOM: Vancomycin Rm: 15

## 2020-10-27 LAB — RENAL FUNCTION PANEL
Albumin: 2.1 g/dL — ABNORMAL LOW (ref 3.5–5.0)
Anion gap: 13 (ref 5–15)
BUN: 50 mg/dL — ABNORMAL HIGH (ref 6–20)
CO2: 26 mmol/L (ref 22–32)
Calcium: 8.5 mg/dL — ABNORMAL LOW (ref 8.9–10.3)
Chloride: 90 mmol/L — ABNORMAL LOW (ref 98–111)
Creatinine, Ser: 7.9 mg/dL — ABNORMAL HIGH (ref 0.61–1.24)
GFR, Estimated: 8 mL/min — ABNORMAL LOW (ref >60)
Glucose, Bld: 108 mg/dL — ABNORMAL HIGH (ref 70–99)
Phosphorus: 5.4 mg/dL — ABNORMAL HIGH (ref 2.5–4.6)
Potassium: 4 mmol/L (ref 3.5–5.1)
Sodium: 129 mmol/L — ABNORMAL LOW (ref 135–145)

## 2020-10-27 MED ORDER — LIDOCAINE HCL (PF) 1 % IJ SOLN: 5.0000 mL | INTRAMUSCULAR | Status: DC | PRN

## 2020-10-27 MED ORDER — HEPARIN SODIUM (PORCINE) 1000 UNIT/ML DIALYSIS: 1000.0000 [IU] | INTRAMUSCULAR | Status: DC | PRN

## 2020-10-27 MED ORDER — VANCOMYCIN HCL IN DEXTROSE 750-5 MG/150ML-% IV SOLN
750.0000 mg | INTRAVENOUS | Status: DC
  Filled 2020-10-27 (×6): qty 150

## 2020-10-27 MED ORDER — LIDOCAINE-PRILOCAINE 2.5-2.5 % EX CREA: 1.0000 "application " | TOPICAL_CREAM | CUTANEOUS | Status: DC | PRN

## 2020-10-27 MED ORDER — DOXERCALCIFEROL 4 MCG/2ML IV SOLN
INTRAVENOUS | Status: AC
  Administered 2020-10-27: 3 ug via INTRAVENOUS
  Filled 2020-10-27: qty 2

## 2020-10-27 MED ORDER — SODIUM CHLORIDE 0.9 % IV SOLN: 100.0000 mL | INTRAVENOUS | Status: DC | PRN

## 2020-10-27 MED ORDER — HEPARIN SODIUM (PORCINE) 1000 UNIT/ML DIALYSIS: 1500.0000 [IU] | INTRAMUSCULAR | Status: DC | PRN

## 2020-10-27 MED ORDER — VANCOMYCIN HCL IN DEXTROSE 750-5 MG/150ML-% IV SOLN
INTRAVENOUS | Status: AC
  Administered 2020-10-27: 750 mg via INTRAVENOUS
  Filled 2020-10-27: qty 150

## 2020-10-27 MED ORDER — ALTEPLASE 2 MG IJ SOLR: 2.0000 mg | Freq: Once | INTRAMUSCULAR | Status: DC | PRN

## 2020-10-27 MED ORDER — PENTAFLUOROPROP-TETRAFLUOROETH EX AERO: 1.0000 "application " | INHALATION_SPRAY | CUTANEOUS | Status: DC | PRN

## 2020-10-27 MED ORDER — HEPARIN SODIUM (PORCINE) 1000 UNIT/ML DIALYSIS: 2000.0000 [IU] | INTRAMUSCULAR | Status: DC | PRN

## 2020-10-27 NOTE — Progress Notes (Signed)
Physical Therapy Treatment Patient Details Name: Patrick Anthony MRN: [id number] DOB: November 09, 1970 Today's Date: 10/27/2020    History of Present Illness Pt is a 50 y.o. male admitted 09/27/20 with fever, chills, chest pain, SOB, generalized weakness. Pt found to have MRSA bacteremia. 2/22 MRI showed multiple scattered small infarcts (bilateral cerebral hemispheres, R basal ganglia, bileteral cerebellum); suspect embolic process. Echo showed mitral valve vegetation, mild-moderate mitral regurgitation. No significant CAD on L heart cath. 3/8 dental extraction. 3/10 mini MVR with closure of PFO. 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 PMH includes CKD (HD MWF), HTN, CHF, Hep C, polysubstance abuse.    PT Comments    Pt received in supine, agreeable to therapy session with encouragement, with fair participation and good tolerance for mobility. Pt performed serial transfer training from bed/chair and short gait trial at bedside with NWB on RLE for LLE strengthening due to possible upcoming amputation on RLE. Pt performed all tasks with minA at most, although mostly needing min guard with RW for single leg transfers/standing activities. Distance limited as pt with nose bleed and wanting to speak with PCP. Will plan to continue LLE strengthening and gait progression next session. Pt continues to benefit from PT services to progress toward functional mobility goals. Continue to recommend HHPT.  Follow Up Recommendations  Home health PT (pending medical course)     Equipment Recommendations  Rolling walker with 5" wheels;3in1 (PT)    Recommendations for Other Services       Precautions / Restrictions Precautions Precautions: Fall Restrictions Weight Bearing Restrictions: Yes RLE Weight Bearing: Weight bearing as tolerated    Mobility  Bed Mobility Overal bed mobility: Modified Independent             General bed mobility comments: HOB flat, use of  bed features    Transfers Overall transfer level: Needs assistance Equipment used: Rolling walker (2 wheeled) Transfers: Sit to/from [facility] Sit to Stand: Min guard Stand pivot transfers: Min guard       General transfer comment: practiced standing from chair<>RW and stand pivot transfer with NWB on RLE due to possibility of AKA in next couple of days; pt able to perform with min guard  Ambulation/Gait Ambulation/Gait assistance: Min guard;Min assist Gait Distance (Feet): 10 Feet Assistive device: Rolling walker (2 wheeled) Gait Pattern/deviations: Step-to pattern;Shuffle;Decreased step length - left (hop-to gait pattern, NWB on RLE) Gait velocity: decreased   General Gait Details: practicing NWB on RLE due to possible upcoming amputation surgery, pt also reporting moderate RLE pain; decreased L foot clearance but with RW lowered pt able to push better through BUE; able to hold RLE up better today; decreased distance due to nose bleed/dripping from nose and pt internally distracted/requesting to call MD; 1 minor LOB requiring minA to correct, otherwise min guard   Stairs   Balance Overall balance assessment: Needs assistance Sitting-balance support: No upper extremity supported;Feet supported Sitting balance-Leahy Scale: Good     Standing balance support: No upper extremity supported;During functional activity;Bilateral upper extremity supported Standing balance-Leahy Scale: Fair Standing balance comment: used RW due to RLE pain, fair standing balance without RW with BLE support but reliant on BUE support while NWB             Cognition Arousal/Alertness: Awake/alert Behavior During Therapy: WFL for tasks assessed/performed Overall Cognitive Status: Impaired/Different from baseline Area of Impairment: Safety/judgement;Problem solving;Attention;Awareness  Current Attention Level: Divided Memory: Decreased recall of  precautions;Decreased short-term memory Following Commands: Follows one step commands consistently Safety/Judgement: Decreased awareness of safety;Decreased awareness of deficits Awareness: Emergent Problem Solving: Requires verbal cues General Comments: Appropriate during session; answering questions; decreased insight into deficits; Pt following most 1-step commands but some impulsivity noted      Exercises Other Exercises Other Exercises: verbal review for supine/seated LLE therex, pt deferring to perform seated exercises at this time Other Exercises: gait billed as TE for LLE strengthening (NWB on R) Other Exercises: STS x5 reps from chair with LLE only (RLE advanced) for strengthening    General Comments General comments (skin integrity, edema, etc.): HR 80's bpm with exertion; VSS on RA, no dizziness reported      Pertinent Vitals/Pain Pain Assessment: Faces Faces Pain Scale: Hurts little more Pain Location: anterior RLE Pain Descriptors / Indicators: Tender;Sore Pain Intervention(s): Limited activity within patient's tolerance;Monitored during session;Repositioned    Home Living   Prior Function    PT Goals (current goals can now be found in the care plan section) Acute Rehab PT Goals Patient Stated Goal: to return home and be independent PT Goal Formulation: With patient Time For Goal Achievement: 10/31/20 Potential to Achieve Goals: Fair Progress towards PT goals: Progressing toward goals    Frequency    Min 3X/week      PT Plan Current plan remains appropriate    AM-PAC PT "6 Clicks" Mobility   Outcome Measure  Help needed turning from your back to your side while in a flat bed without using bedrails?: None Help needed moving from lying on your back to sitting on the side of a flat bed without using bedrails?: None Help needed moving to and from a bed to a chair (including a wheelchair)?: A Little Help needed standing up from a chair using your arms  (e.g., wheelchair or bedside chair)?: A Little Help needed to walk in hospital room?: A Little Help needed climbing 3-5 steps with a railing? : A Lot 6 Click Score: 19    End of Session Equipment Utilized During Treatment: Gait belt Activity Tolerance: Patient tolerated treatment well Patient left: with call bell/phone within reach;in chair (RN aware pt in chair) Nurse Communication: Mobility status PT Visit Diagnosis: Difficulty in walking, not elsewhere classified (R26.2);Other abnormalities of gait and mobility (R26.89)     Time: [id number] PT Time Calculation (min) (ACUTE ONLY): 22 min  Charges:  $Therapeutic Exercise: 8-22 mins                     Brayson Livesey P., PTA Acute Rehabilitation Services Pager: [phone] Office: [place] 10/27/2020, 11:49 AM

## 2020-10-27 NOTE — Progress Notes (Signed)
Subjective: Patient is still really depressed and overwhelmed by the idea that Patrick Anthony will need to have his leg amputated above the knee to affect cure of his infection Antibiotics:  Anti-infectives (From admission, onward)   Start     Dose/Rate Route Frequency Ordered Stop   10/25/20 0922  vancomycin (VANCOCIN) 500-5 MG/100ML-% IVPB       Note to Pharmacy: Kalman Shan   : cabinet override      10/25/20 0922 10/25/20 0958   10/21/20 0600  ceFAZolin (ANCEF) IVPB 2g/100 mL premix        2 g 200 mL/hr over 30 Minutes Intravenous On call to O.R. 10/20/20 1349 10/21/20 0650   10/20/20 2243  vancomycin (VANCOCIN) 500-5 MG/100ML-% IVPB       Note to Pharmacy: Wallace Cullens   : cabinet override      10/20/20 2243 10/20/20 2243   10/20/20 1437  ceFAZolin (ANCEF) 2-4 GM/100ML-% IVPB       Note to Pharmacy: Ladoris Gene   : cabinet override      10/20/20 1437 10/20/20 1612   10/20/20 1200  vancomycin (VANCOREADY) IVPB 500 mg/100 mL        500 mg 100 mL/hr over 60 Minutes Intravenous Every M-W-F (Hemodialysis) 10/20/20 0903     10/18/20 1124  vancomycin variable dose per unstable renal function (pharmacist dosing)  Status:  Discontinued         Does not apply See admin instructions 10/18/20 1124 10/20/20 0903   10/16/20 1600  vancomycin (VANCOREADY) IVPB 500 mg/100 mL  Status:  Discontinued        500 mg 100 mL/hr over 60 Minutes Intravenous Every Sat (Hemodialysis) 10/16/20 1306 10/16/20 1312   10/15/20 1500  vancomycin (VANCOCIN) IVPB 500 mg/100 ml premix  Status:  Discontinued        500 mg 100 mL/hr over 60 Minutes Intravenous Every M-W-F (Hemodialysis) 10/15/20 1357 10/17/20 0942   10/14/20 2130  vancomycin (VANCOCIN) IVPB 1000 mg/200 mL premix  Status:  Discontinued        1,000 mg 200 mL/hr over 60 Minutes Intravenous  Once 10/14/20 1403 10/14/20 1547   10/14/20 2130  vancomycin (VANCOREADY) IVPB 500 mg/100 mL        500 mg 100 mL/hr over 60 Minutes Intravenous  Once  10/14/20 1547 10/14/20 2222   10/14/20 1730  cefUROXime (ZINACEF) 1.5 g in sodium chloride 0.9 % 100 mL IVPB        1.5 g 200 mL/hr over 30 Minutes Intravenous Every 24 hr x 2 10/14/20 1403 10/15/20 1718   10/14/20 1128  vancomycin (VANCOCIN) 1,000 mg in sodium chloride 0.9 % 1,000 mL irrigation  Status:  Discontinued          As needed 10/14/20 1128 10/14/20 1401   10/14/20 0400  vancomycin (VANCOREADY) IVPB 1250 mg/250 mL        1,250 mg 166.7 mL/hr over 90 Minutes Intravenous To Surgery 10/13/20 1039 10/14/20 0910   10/14/20 0400  cefUROXime (ZINACEF) 1.5 g in sodium chloride 0.9 % 100 mL IVPB        1.5 g 200 mL/hr over 30 Minutes Intravenous To Surgery 10/13/20 1039 10/14/20 0835   10/14/20 0400  cefUROXime (ZINACEF) 750 mg in sodium chloride 0.9 % 100 mL IVPB        750 mg 200 mL/hr over 30 Minutes Intravenous To Surgery 10/13/20 1039 10/14/20 1250   10/14/20 0400  vancomycin (VANCOCIN) 1,000 mg  in sodium chloride 0.9 % 1,000 mL irrigation  Status:  Discontinued         Irrigation To Surgery 10/13/20 1039 10/14/20 1403   10/13/20 1657  vancomycin (VANCOCIN) 500-5 MG/100ML-% IVPB       Note to Pharmacy: Judieth Keens  : cabinet override      10/13/20 1657 10/13/20 1717   10/12/20 0600  ceFAZolin (ANCEF) IVPB 2g/100 mL premix        2 g 200 mL/hr over 30 Minutes Intravenous On call to O.R. 10/11/20 1841 10/12/20 1315   10/11/20 1415  vancomycin (VANCOCIN) IVPB 500 mg/100 ml premix  Status:  Discontinued        500 mg 100 mL/hr over 60 Minutes Intravenous Every M-W-F (Hemodialysis) 10/11/20 1413 10/14/20 1403   10/11/20 1413  vancomycin (VANCOCIN) 500-5 MG/100ML-% IVPB       Note to Pharmacy: Kalman Shan   : cabinet override      10/11/20 1413 10/12/20 0229   10/11/20 1353  Vancomycin (VANCOCIN) 750-5 MG/150ML-% IVPB  Status:  Discontinued       Note to Pharmacy: Kalman Shan   : cabinet override      10/11/20 1353 10/11/20 1417   10/08/20 1200  vancomycin (VANCOCIN) IVPB 750  mg/150 ml premix  Status:  Discontinued        750 mg 150 mL/hr over 60 Minutes Intravenous Every M-W-F (Hemodialysis) 10/05/20 1404 10/11/20 1413   10/06/20 1619  vancomycin (VANCOCIN) 500-5 MG/100ML-% IVPB       Note to Pharmacy: Judieth Keens  : cabinet override      10/06/20 1619 10/06/20 1736   10/06/20 1200  vancomycin (VANCOCIN) IVPB 750 mg/150 ml premix  Status:  Discontinued        750 mg 150 mL/hr over 60 Minutes Intravenous Every M-W-F (Hemodialysis) 10/04/20 0905 10/05/20 1404   10/06/20 1200  vancomycin (VANCOCIN) IVPB 500 mg/100 ml premix        500 mg 100 mL/hr over 60 Minutes Intravenous Every Wed (Hemodialysis) 10/05/20 1404 10/06/20 2000   10/01/20 2000  DAPTOmycin (CUBICIN) 840 mg in sodium chloride 0.9 % IVPB  Status:  Discontinued        840 mg 233.6 mL/hr over 30 Minutes Intravenous Every 48 hours 10/01/20 0922 10/01/20 1056   10/01/20 1200  vancomycin (VANCOCIN) IVPB 1000 mg/200 mL premix  Status:  Discontinued        1,000 mg 200 mL/hr over 60 Minutes Intravenous Every M-W-F (Hemodialysis) 10/01/20 1056 10/04/20 0845   09/29/20 1200  vancomycin (VANCOCIN) IVPB 1000 mg/200 mL premix  Status:  Discontinued        1,000 mg 200 mL/hr over 60 Minutes Intravenous Every M-W-F (Hemodialysis) 09/27/20 1443 10/01/20 0922   09/29/20 1200  ceFEPIme (MAXIPIME) 2 g in sodium chloride 0.9 % 100 mL IVPB  Status:  Discontinued        2 g 200 mL/hr over 30 Minutes Intravenous Every M-W-F (Hemodialysis) 09/27/20 1443 09/28/20 1054   09/28/20 0900  metroNIDAZOLE (FLAGYL) IVPB 500 mg  Status:  Discontinued        500 mg 100 mL/hr over 60 Minutes Intravenous Every 12 hours 09/27/20 1808 09/28/20 1054   09/28/20 0900  ceFEPIme (MAXIPIME) 1 g in sodium chloride 0.9 % 100 mL IVPB  Status:  Discontinued        1 g 200 mL/hr over 30 Minutes Intravenous Every 12 hours 09/27/20 1808 09/27/20 1812   09/27/20 1445  vancomycin (VANCOREADY) IVPB 2000  mg/400 mL        2,000 mg 200 mL/hr  over 120 Minutes Intravenous  Once 09/27/20 1433 09/27/20 2330   09/27/20 1430  ceFEPIme (MAXIPIME) 2 g in sodium chloride 0.9 % 100 mL IVPB        2 g 200 mL/hr over 30 Minutes Intravenous  Once 09/27/20 1418 09/27/20 1541   09/27/20 1430  metroNIDAZOLE (FLAGYL) IVPB 500 mg        500 mg 100 mL/hr over 60 Minutes Intravenous  Once 09/27/20 1418 09/27/20 2214   09/27/20 1430  vancomycin (VANCOREADY) IVPB 1750 mg/350 mL  Status:  Discontinued        1,750 mg 175 mL/hr over 120 Minutes Intravenous  Once 09/27/20 1418 09/27/20 1433      Medications: Scheduled Meds: . (feeding supplement) PROSource Plus  30 mL Oral BID BM  . aspirin EC  325 mg Oral Daily  . calcium acetate  667 mg Oral TID WC  . Chlorhexidine Gluconate Cloth  6 each Topical Q0600  . cinacalcet  30 mg Oral Q breakfast  . cloNIDine  0.3 mg Transdermal Weekly  . [START ON 10/29/2020] darbepoetin (ARANESP) injection - DIALYSIS  60 mcg Intravenous Q Fri-HD  . doxercalciferol  3 mcg Intravenous Q M,W,F-HD  . enoxaparin (LOVENOX) injection  30 mg Subcutaneous Q48H  . feeding supplement (NEPRO CARB STEADY)  237 mL Oral TID BM  . gabapentin  200 mg Oral QHS  . influenza vac split quadrivalent PF  0.5 mL Intramuscular Tomorrow-1000  . metoprolol tartrate  50 mg Oral BID  . multivitamin  1 tablet Oral QHS  . oxymetazoline  1 spray Each Nare BID  . pantoprazole  40 mg Oral Daily  . senna-docusate  1 tablet Oral BID  . sodium chloride flush  3 mL Intravenous Q12H   Continuous Infusions: . sodium chloride    . sodium chloride    . sodium chloride    . vancomycin 500 mg (10/22/20 1627)   PRN Meds:.sodium chloride, sodium chloride, sodium chloride, acetaminophen, camphor-menthol, diphenhydrAMINE, HYDROmorphone, ondansetron **OR** ondansetron (ZOFRAN) IV, oxyCODONE, pentafluoroprop-tetrafluoroeth, sodium chloride flush    Objective: Weight change: -1.161 kg No intake or output data in the 24 hours ending 10/27/20 1326 Blood  pressure 130/90, pulse 80, temperature 97.7 F (36.5 C), temperature source Oral, resp. rate 18, height 6' 0.01" (1.829 m), weight 76.8 kg, SpO2 93 %. Temp:  [97.6 F (36.4 C)-98.5 F (36.9 C)] 97.7 F (36.5 C) (03/23 1240) Pulse Rate:  [80-92] 80 (03/23 1240) Resp:  [14-18] 18 (03/23 1240) BP: (106-135)/(71-93) 130/90 (03/23 1240) SpO2:  [92 %-100 %] 93 % (03/23 1240) Weight:  [76.8 kg] 76.8 kg (03/23 0447)  Physical Exam: Physical Exam Constitutional:      Appearance: Patrick Anthony is well-developed.  HENT:     Head: Normocephalic and atraumatic.  Eyes:     Conjunctiva/sclera: Conjunctivae normal.  Cardiovascular:     Rate and Rhythm: Normal rate and regular rhythm.  Pulmonary:     Effort: Pulmonary effort is normal. No respiratory distress.     Breath sounds: No wheezing.  Abdominal:     General: There is no distension.     Palpations: Abdomen is soft.  Musculoskeletal:        General: Normal range of motion.     Cervical back: Normal range of motion and neck supple.  Skin:    General: Skin is warm and dry.     Findings: No erythema or rash.  Neurological:     Mental Status: Patrick Anthony is alert and oriented to person, place, and time.  Psychiatric:        Attention and Perception: Attention normal.        Mood and Affect: Mood is depressed.        Behavior: Behavior normal.        Thought Content: Thought content normal.        Judgment: Judgment normal.     Right  leg wrapped  CBC:    BMET Recent Labs    10/26/20 0138 10/27/20 0122  NA 132* 129*  K 3.6 4.0  CL 93* 90*  CO2 28 26  GLUCOSE 116* 108*  BUN 34* 50*  CREATININE 6.03* 7.90*  CALCIUM 8.7* 8.5*     Liver Panel  Recent Labs    10/26/20 0138 10/27/20 0122  ALBUMIN 2.1* 2.1*       Sedimentation Rate No results for input(s): ESRSEDRATE in the last 72 hours. C-Reactive Protein No results for input(s): CRP in the last 72 hours.  Micro Results: Recent Results (from the past 720 hour(s))  Blood  Culture (routine x 2)     Status: Abnormal   Collection Time: 09/27/20  2:16 PM   Specimen: BLOOD RIGHT HAND  Result Value Ref Range Status   Specimen Description BLOOD RIGHT HAND  Final   Special Requests   Final    BOTTLES DRAWN AEROBIC AND ANAEROBIC Blood Culture results may not be optimal due to an inadequate volume of blood received in culture bottles Performed at [facility] [address], [place], [place] [address]    Culture  Setup Time   Final    GRAM POSITIVE COCCI IN CLUSTERS IN BOTH AEROBIC AND ANAEROBIC BOTTLES    Culture METHICILLIN RESISTANT STAPHYLOCOCCUS AUREUS (A)  Final   Report Status 09/30/2020 FINAL  Final   Organism ID, Bacteria METHICILLIN RESISTANT STAPHYLOCOCCUS AUREUS  Final      Susceptibility   Methicillin resistant staphylococcus aureus - MIC*    CIPROFLOXACIN >=8 RESISTANT Resistant     ERYTHROMYCIN >=8 RESISTANT Resistant     GENTAMICIN <=0.5 SENSITIVE Sensitive     OXACILLIN >=4 RESISTANT Resistant     TETRACYCLINE <=1 SENSITIVE Sensitive     VANCOMYCIN <=0.5 SENSITIVE Sensitive     TRIMETH/SULFA <=10 SENSITIVE Sensitive     CLINDAMYCIN <=0.25 SENSITIVE Sensitive     RIFAMPIN <=0.5 SENSITIVE Sensitive     Inducible Clindamycin NEGATIVE Sensitive     * METHICILLIN RESISTANT STAPHYLOCOCCUS AUREUS  Resp Panel by RT-PCR (Flu A&B, Covid) Nasopharyngeal Swab     Status: None   Collection Time: 09/27/20  2:16 PM   Specimen: Nasopharyngeal Swab; Nasopharyngeal(NP) swabs in vial transport medium  Result Value Ref Range Status   SARS Coronavirus 2 by RT PCR NEGATIVE NEGATIVE Final    Comment: (NOTE) SARS-CoV-2 target nucleic acids are NOT DETECTED.  The SARS-CoV-2 RNA is generally detectable in upper respiratory specimens during the acute phase of infection. The lowest concentration of SARS-CoV-2 viral copies this assay can detect is 138 copies/mL. A negative result does not preclude SARS-Cov-2 infection and should not be used as the sole basis  for treatment or other patient management decisions. A negative result may occur with  improper specimen collection/handling, submission of specimen other than nasopharyngeal swab, presence of viral mutation(s) within the areas targeted by this assay, and inadequate number of viral copies(<138 copies/mL). A negative result must be combined with clinical observations, patient  history, and epidemiological information. The expected result is Negative.  Fact Sheet for Patients:  EntrepreneurPulse.com.au  Fact Sheet for Healthcare Providers:  IncredibleEmployment.be  This test is no t yet approved or cleared by the [place] FDA and  has been authorized for detection and/or diagnosis of SARS-CoV-2 by FDA under an Emergency Use Authorization (EUA). This EUA will remain  in effect (meaning this test can be used) for the duration of the COVID-19 declaration under Section 564(b)(1) of the Act, 21 U.S.C.section 360bbb-3(b)(1), unless the authorization is terminated  or revoked sooner.       Influenza A by PCR NEGATIVE NEGATIVE Final   Influenza B by PCR NEGATIVE NEGATIVE Final    Comment: (NOTE) The Xpert Xpress SARS-CoV-2/FLU/RSV plus assay is intended as an aid in the diagnosis of influenza from Nasopharyngeal swab specimens and should not be used as a sole basis for treatment. Nasal washings and aspirates are unacceptable for Xpert Xpress SARS-CoV-2/FLU/RSV testing.  Fact Sheet for Patients: EntrepreneurPulse.com.au  Fact Sheet for Healthcare Providers: IncredibleEmployment.be  This test is not yet approved or cleared by the [place] FDA and has been authorized for detection and/or diagnosis of SARS-CoV-2 by FDA under an Emergency Use Authorization (EUA). This EUA will remain in effect (meaning this test can be used) for the duration of the COVID-19 declaration under Section 564(b)(1) of the Act, 21  U.S.C. section 360bbb-3(b)(1), unless the authorization is terminated or revoked.  Performed at [facility], [place] [address] [place], [place] [address]   Blood Culture ID Panel (Reflexed)     Status: Abnormal   Collection Time: 09/27/20  2:16 PM  Result Value Ref Range Status   Enterococcus faecalis NOT DETECTED NOT DETECTED Final   Enterococcus Faecium NOT DETECTED NOT DETECTED Final   Listeria monocytogenes NOT DETECTED NOT DETECTED Final   Staphylococcus species DETECTED (A) NOT DETECTED Final    Comment: CRITICAL RESULT CALLED TO, READ BACK BY AND VERIFIED WITH: C,PIERCE PHARMD '@1009'$  09/28/20 EB    Staphylococcus aureus (BCID) DETECTED (A) NOT DETECTED Final    Comment: Methicillin (oxacillin)-resistant Staphylococcus aureus (MRSA). MRSA is predictably resistant to beta-lactam antibiotics (except ceftaroline). Preferred therapy is vancomycin unless clinically contraindicated. Patient requires contact precautions if  hospitalized. CRITICAL RESULT CALLED TO, READ BACK BY AND VERIFIED WITH: C,PIERCE PHARMD '@1009'$  09/28/20 EB    Staphylococcus epidermidis NOT DETECTED NOT DETECTED Final   Staphylococcus lugdunensis NOT DETECTED NOT DETECTED Final   Streptococcus species NOT DETECTED NOT DETECTED Final   Streptococcus agalactiae NOT DETECTED NOT DETECTED Final   Streptococcus pneumoniae NOT DETECTED NOT DETECTED Final   Streptococcus pyogenes NOT DETECTED NOT DETECTED Final   A.calcoaceticus-baumannii NOT DETECTED NOT DETECTED Final   Bacteroides fragilis NOT DETECTED NOT DETECTED Final   Enterobacterales NOT DETECTED NOT DETECTED Final   Enterobacter cloacae complex NOT DETECTED NOT DETECTED Final   Escherichia coli NOT DETECTED NOT DETECTED Final   Klebsiella aerogenes NOT DETECTED NOT DETECTED Final   Klebsiella oxytoca NOT DETECTED NOT DETECTED Final   Klebsiella pneumoniae NOT DETECTED NOT DETECTED Final   Proteus species NOT DETECTED NOT DETECTED Final   Salmonella  species NOT DETECTED NOT DETECTED Final   Serratia marcescens NOT DETECTED NOT DETECTED Final   Haemophilus influenzae NOT DETECTED NOT DETECTED Final   Neisseria meningitidis NOT DETECTED NOT DETECTED Final   Pseudomonas aeruginosa NOT DETECTED NOT DETECTED Final   Stenotrophomonas maltophilia NOT DETECTED NOT DETECTED Final   Candida albicans NOT DETECTED NOT DETECTED Final  Candida auris NOT DETECTED NOT DETECTED Final   Candida glabrata NOT DETECTED NOT DETECTED Final   Candida krusei NOT DETECTED NOT DETECTED Final   Candida parapsilosis NOT DETECTED NOT DETECTED Final   Candida tropicalis NOT DETECTED NOT DETECTED Final   Cryptococcus neoformans/gattii NOT DETECTED NOT DETECTED Final   Meth resistant mecA/C and MREJ DETECTED (A) NOT DETECTED Final    Comment: CRITICAL RESULT CALLED TO, READ BACK BY AND VERIFIED WITH: C,PIERCE PHARMD '@1009'$  09/28/20 EB Performed at [facility], [address], [place], [place] [address]   Blood Culture (routine x 2)     Status: Abnormal   Collection Time: 09/27/20  2:29 PM   Specimen: BLOOD  Result Value Ref Range Status   Specimen Description BLOOD SITE NOT SPECIFIED  Final   Special Requests   Final    BOTTLES DRAWN AEROBIC AND ANAEROBIC Blood Culture results may not be optimal due to an inadequate volume of blood received in culture bottles   Culture  Setup Time   Final    GRAM POSITIVE COCCI IN CLUSTERS ANAEROBIC BOTTLE ONLY CRITICAL VALUE NOTED.  VALUE IS CONSISTENT WITH PREVIOUSLY REPORTED AND CALLED VALUE.    Culture (A)  Final    STAPHYLOCOCCUS AUREUS SUSCEPTIBILITIES PERFORMED ON PREVIOUS CULTURE WITHIN THE LAST 5 DAYS. Performed at [facility], [place] [address], [place], [place] [address]    Report Status 10/01/2020 FINAL  Final  Culture, blood (routine x 2)     Status: None   Collection Time: 09/30/20  6:04 AM   Specimen: BLOOD RIGHT FOREARM  Result Value Ref Range Status   Specimen Description BLOOD RIGHT FOREARM   Final   Special Requests   Final    BOTTLES DRAWN AEROBIC AND ANAEROBIC Blood Culture adequate volume   Culture   Final    NO GROWTH 5 DAYS Performed at [facility], [place] [address] [place], [place] [address]    Report Status 10/05/2020 FINAL  Final  Culture, blood (routine x 2)     Status: Abnormal   Collection Time: 09/30/20  6:08 AM   Specimen: BLOOD RIGHT HAND  Result Value Ref Range Status   Specimen Description BLOOD RIGHT HAND  Final   Special Requests   Final    BOTTLES DRAWN AEROBIC AND ANAEROBIC Blood Culture results may not be optimal due to an inadequate volume of blood received in culture bottles   Culture  Setup Time   Final    GRAM POSITIVE COCCI IN CLUSTERS ANAEROBIC BOTTLE ONLY CRITICAL VALUE NOTED.  VALUE IS CONSISTENT WITH PREVIOUSLY REPORTED AND CALLED VALUE.    Culture (A)  Final    STAPHYLOCOCCUS AUREUS SUSCEPTIBILITIES PERFORMED ON PREVIOUS CULTURE WITHIN THE LAST 5 DAYS. Performed at [facility], [place] [address] [place], [place] [address]    Report Status 10/03/2020 FINAL  Final  Culture, blood (routine x 2)     Status: None   Collection Time: 10/03/20  2:42 AM   Specimen: BLOOD  Result Value Ref Range Status   Specimen Description BLOOD LEFT ARM  Final   Special Requests   Final    BOTTLES DRAWN AEROBIC AND ANAEROBIC Blood Culture results may not be optimal due to an inadequate volume of blood received in culture bottles   Culture   Final    NO GROWTH 5 DAYS Performed at [facility], [place] [address], [place],  [address]    Report Status 10/08/2020 FINAL  Final  Culture, blood (routine x 2)     Status: None   Collection Time: 10/03/20  2:42 AM   Specimen: BLOOD  Result Value Ref Range Status   Specimen Description BLOOD LEFT HAND  Final   Special Requests   Final    BOTTLES DRAWN AEROBIC ONLY Blood Culture results may not be optimal due to an inadequate volume of blood received in culture bottles   Culture   Final    NO  GROWTH 5 DAYS Performed at [facility], [address], [place], [place] [address]    Report Status 10/08/2020 FINAL  Final  Surgical pcr screen     Status: Abnormal   Collection Time: 10/11/20  6:44 PM   Specimen: Nasal Mucosa; Nasal Swab  Result Value Ref Range Status   MRSA, PCR POSITIVE (A) NEGATIVE Final    Comment: RESULT CALLED TO, READ BACK BY AND VERIFIED WITH: F COMBRES RN 10/11/20 2339 JDW    Staphylococcus aureus POSITIVE (A) NEGATIVE Final    Comment: (NOTE) The Xpert SA Assay (FDA approved for NASAL specimens in patients 58 years of age and older), is one component of a comprehensive surveillance program. It is not intended to diagnose infection nor to guide or monitor treatment. Performed at [facility], [place] [address] [place], [place] [address]   Culture, blood (routine x 2)     Status: None   Collection Time: 10/13/20 10:43 PM   Specimen: BLOOD  Result Value Ref Range Status   Specimen Description BLOOD RIGHT THUMB  Final   Special Requests   Final    BOTTLES DRAWN AEROBIC AND ANAEROBIC Blood Culture adequate volume   Culture   Final    NO GROWTH 5 DAYS Performed at [facility], [place] [address] [place], [place] [address]    Report Status 10/19/2020 FINAL  Final  Culture, blood (routine x 2)     Status: None   Collection Time: 10/13/20 10:43 PM   Specimen: BLOOD RIGHT HAND  Result Value Ref Range Status   Specimen Description BLOOD RIGHT HAND  Final   Special Requests   Final    BOTTLES DRAWN AEROBIC AND ANAEROBIC Blood Culture adequate volume   Culture   Final    NO GROWTH 5 DAYS Performed at [facility], [place] [address] [place], [place] [address]    Report Status 10/19/2020 FINAL  Final  Aerobic/Anaerobic Culture w Gram Stain (surgical/deep wound)     Status: None   Collection Time: 10/14/20 10:38 AM   Specimen: Soft Tissue, Other  Result Value Ref Range Status   Specimen Description TISSUE MITRAL VALVE VEGETATION SPEC A   Final   Special Requests MITRAL VALVE VEGETATION  Final   Gram Stain   Final    WBC PRESENT,BOTH PMN AND MONONUCLEAR NO ORGANISMS SEEN    Culture   Final    No growth aerobically or anaerobically. Performed at [facility], [place] [address], [place], [place] [address]    Report Status 10/19/2020 FINAL  Final  Aerobic/Anaerobic Culture w Gram Stain (surgical/deep wound)     Status: None   Collection Time: 10/20/20  4:12 PM   Specimen: Soft Tissue, Other  Result Value Ref Range Status   Specimen Description TISSUE  Final   Special Requests RIGHT LEG ABSC SPEC A  Final   Gram Stain   Final    RARE WBC PRESENT,BOTH PMN AND MONONUCLEAR NO ORGANISMS SEEN  Culture   Final    FEW METHICILLIN RESISTANT STAPHYLOCOCCUS AUREUS CRITICAL RESULT CALLED TO, READ BACK BY AND VERIFIED WITH: RN N.[email or web address] AT [id number] ON 10/22/2020 BY T.SAAD NO ANAEROBES ISOLATED Performed at [facility], [address] [place], [place] [address]    Report Status 10/26/2020 FINAL  Final   Organism ID, Bacteria METHICILLIN RESISTANT STAPHYLOCOCCUS AUREUS  Final      Susceptibility   Methicillin resistant staphylococcus aureus - MIC*    CIPROFLOXACIN >=8 RESISTANT Resistant     ERYTHROMYCIN >=8 RESISTANT Resistant     GENTAMICIN <=0.5 SENSITIVE Sensitive     OXACILLIN >=4 RESISTANT Resistant     TETRACYCLINE <=1 SENSITIVE Sensitive     VANCOMYCIN 1 SENSITIVE Sensitive     TRIMETH/SULFA <=10 SENSITIVE Sensitive     CLINDAMYCIN <=0.25 SENSITIVE Sensitive     RIFAMPIN <=0.5 SENSITIVE Sensitive     Inducible Clindamycin NEGATIVE Sensitive     * FEW METHICILLIN RESISTANT STAPHYLOCOCCUS AUREUS  Fungus Stain     Status: None   Collection Time: 10/20/20  4:12 PM   Specimen: Soft Tissue, Other  Result Value Ref Range Status   FUNGUS STAIN Final report  Final    Comment: (NOTE) Performed At: [facility] [address] [place], [place] [id number] Rush Farmer MD [id number]    Fungal Source  TISSUE  Final    Comment: RIGHT LEG ABSC SPEC A Performed at [facility], [place] [address] [place], [place] [address]   Fungal Stain reflex     Status: None   Collection Time: 10/20/20  4:12 PM  Result Value Ref Range Status   Fungal stain result 1 Comment  Final    Comment: (NOTE) KOH/Calcofluor preparation:  no fungus observed. Performed At: [facility] [place], [place] [id number] Rush Farmer MD [id number]     Studies/Results: No results found.    Assessment/Plan:  INTERVAL HISTORY:  Positive care of been consulted and the patient is also going to call his primary care physician  Principal Problem:   Bacterial endocarditis Active Problems:   Chronic combined systolic and diastolic congestive heart failure (HCC)   HTN (hypertension)   Anemia in chronic kidney disease   Hepatitis C   ESRD on dialysis ([place])   End stage renal disease (HCC)   Chest pain   Sepsis ([place])   Pneumonia due to infectious agent   ADPKD (autosomal dominant polycystic kidney disease)   Sepsis due to pneumonia ([place])   Lactic acidosis   Cerebral embolism with cerebral infarction   Bacteremia due to Staphylococcus aureus   Mitral regurgitation   Uncontrolled hypertension   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  Patrick Anthony had extraction of multiple teeth by oral surgery.  On 10 March she went to the OR and underwent mitral valve  replacement and closure of the PFO.  In the interim Patrick Anthony was found  to have large " bumps" on his right leg which Patrick Anthony had not mentioned until after surgery.  Patrick Anthony turned out to have large complicated abscesses with osteomyelitis involving his tibia with a 7 x 3 cm area of bone that was excised.  The patient needs an above-the-knee amputation but is still not consented to going forward with surgery.  Patrick Anthony has been told by multiple providers multiple times that Patrick Anthony needs an above-the-knee amputation to avoid seeding his new valve his graft and potentially dying from staph aureus bacteremia and sepsis.  Patrick Anthony is going to call Dr. Larey Days his PCP, Patrick Anthony is hoping that being an African MD Patrick Anthony might know of some other remedy though Patrick Anthony tells me if his PCP Elsner there is no other solution then Patrick Anthony will have to accept it.  I do not think that if Patrick Anthony has an above-the-knee amputation Patrick Anthony will build to have a prosthesis and when Patrick Anthony heard this from me Patrick Anthony was further distraught.  I did point out though that Patrick Anthony still had his other leg Patrick Anthony would be able to ambulate in my opinion and that Patrick Anthony really did not have any other options at prolonging his life if Patrick Anthony did not go through the surgery.  We could certainly try suppressive antibiotics but they would invariably fail  I spent greater than 35 minutes with the patient including greater than 50% of time in face to face counsel of the patient regarding his need for AKA and in coordination of his care     LOS: 30 days   Alcide Evener 10/27/2020, 1:26 PM

## 2020-10-27 NOTE — Progress Notes (Addendum)
Pharmacy Antibiotic Note  Patrick Anthony is a 50 y.o. male admitted on 09/27/2020 with fever, chills, and generalized weakness.  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  Continues on Vancomycin per pharmacy.  Pt has ESRD with HD MWF. Plans are for 6 weeks of antibiotics with day 1 being post-op day 1 from valve surgery per 3/7 ID note  3/10 > (4/21). Dr. Sharol Given recommends amputation of R tibia - patient to decide by end of week if he would like to proceed.  Pre-HD vancomycin level is on low end of therapeutic range at 15 mcg/ml (goal 15-25 mcg/ml). In dialysis currently. Spoke with HD RN about increase in dose.  Plan: -Increase vancomycin to 750 mg IV qHD  -Continue following HD schedule/duration -F/u Pre-HD VR at steady-state  Height: 6' 0.01" (182.9 cm) Weight: 79.9 kg (176 lb 2.4 oz) IBW/kg (Calculated) : 77.62  Temp (24hrs), Avg:97.9 F (36.6 C), Min:97 F (36.1 C), Max:98.5 F (36.9 C)  Recent Labs  Lab 10/23/20 0144 10/24/20 0341 10/25/20 0154 10/26/20 0138 10/27/20 0122 10/27/20 1748  WBC 12.7* 11.9* 12.5* 11.5* 14.2*  --   CREATININE 4.44* 6.56* 8.37* 6.03* 7.90*  --   VANCORANDOM  --   --   --   --   --  15    Estimated Creatinine Clearance: 12.4 mL/min (A) (by C-G formula based on SCr of 7.9 mg/dL (H)).    No Known Allergies  Antimicrobials this admission: Vanc 2/21>> Cefepime 2/21>>2/22 Flagyl 2/21>>2/22 (2 doses given)  Dose adjustments this admission: 2/28 Pre-HD VR 41 - estimate post HD level 27 Dose held 2/28 and reduced to '750mg'$  IV qHD starting 3/2 3/7 pre HD VR= 29; change vanc to '500mg'$  w/ HD 3/13 Post-HD VR 29 - no additional dose (did get additional dose of vanc around time of surgery) 3/16 Pre-HD VR 20 - continue with 500 IV qHD 3/17 Pre-HD VR 15 - incr to 750 IV qHD  Microbiology results: 2/21 blood x 2: MRSA, sens Vanc MIC < 0.5, Septra, TCN, gent MIC < 0.5, Clinda 2/22 BCID: MRSA  2/21 COVID and flu:  negative 2/21 HIV: non-reactive 2/24 blood x 2: 1/2 SA 2/27 blood x 2: neg  Thank you for involving pharmacy in this patient's care.  Renold Genta, PharmD, BCPS Clinical Pharmacist Clinical phone for 10/27/2020 until 10p is [phone] 10/27/2020 7:58 PM  **Pharmacist phone directory can be found on [email or web address] listed under [email or web address]**

## 2020-10-27 NOTE — Progress Notes (Signed)
OT Cancellation Note  Patient Details Name: Patrick Anthony MRN: [id number] DOB: 11-15-1970   Cancelled Treatment:    Reason Eval/Treat Not Completed: Other (comment) pt declined OT session this AM as pt noticeably upset about having to decide whether or not to proceed with amputation. Will check back as time allows for OT session.  [name], COTA/L Acute Rehabilitation Services [phone]   [name] 10/27/2020, 11:37 AM

## 2020-10-27 NOTE — Progress Notes (Signed)
CARDIAC REHAB PHASE I   PRE:  Rate/Rhythm: 80 SR    BP: sitting 149/105    SaO2: 98 RA  MODE:  Ambulation: 430 ft   POST:  Rate/Rhythm: 89 SR    BP: sitting 146/99     SaO2: 99 RA  Pt willing to ambulate. Used RW, slow and steady most of walk. 3/10 pain. x1 LOB while adjusting mask. Depressed. BP elevated in right forearm. [place], ACSM 10/27/2020 3:23 PM

## 2020-10-27 NOTE — Progress Notes (Signed)
[facility] Progress Note  Subjective: pt seen in room. No new c/o.    Vitals:   10/26/20 1959 10/26/20 2310 10/27/20 0447 10/27/20 0810  BP: 124/87 117/79 (!) 135/93 110/77  Pulse: 92 87 84 81  Resp: '18 17 18 15  '$ Temp: 98.5 F (36.9 C) 97.6 F (36.4 C) 98.2 F (36.8 C) 98 F (36.7 C)  TempSrc: Oral Oral Oral Oral  SpO2: 96% 98% 92% 100%  Weight:   76.8 kg   Height:        Exam:  alert, nad   no jvd  Chest cta bilat  Cor reg no RG  Abd soft ntnd no ascites   Ext no LE edema, R lower leg wrapped   Alert, NF, ox3   LUE AVG +bruit     OP HD: [facility]    4h 350/500 new edw here 73- 75kg (was82.5kg)2/2.5 bath LUA AVGHep 5000 -Aranesp 29mgIVq 2wks - last 09/27/20 (Verified order) -Hectorol333m IV qHD    Assessment/ Plan: 1. SP min invasive MVR(bioprosthetic)replacement - on 3/10. Doing well from cardiac standpoint 2. Anterior shin osteomyelitis: status post I&D per orthopedics. Pathology showing staph aureus in the tibia and ortho recommending AKA.  ID following.  3. HTN/volume - continues on clonidine patch and metoprolol 25 bid. BP okay, 3.5 L off w/ HD yest , no BP drops. New lower dry wt looks to be around 73- 75kg.  4. 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 Underwent tooth extraction3/8/22.PerID to get 6 wks IV vanc w/ end date of 11/25/20. 5. ESRD - usual HDMWF.HD today.  6. Anemiaof CKD-HGB 9- 10 range. S/P 2 units of PRBCs 3/10.Darbe 100ug given here on 3/16, then lowered to 60ug weekly next dose due 3/25. No IV Fe due to osteomyelitis.  7. Secondary Hyperparathyroidism -Calcium and phosphorus controlled. Continue sensipar, binders, and hectorol 8. Hx Hep C /liver cirrhosis.       Patrick Anthony 10/27/2020, 11:19 AM   Recent Labs  Lab 10/26/20 0138 10/27/20 0122  K 3.6 4.0  BUN 34* 50*  CREATININE 6.03* 7.90*  CALCIUM 8.7* 8.5*  PHOS 4.7* 5.4*  HGB 9.7*  8.9*   Inpatient medications: . (feeding supplement) PROSource Plus  30 mL Oral BID BM  . aspirin EC  325 mg Oral Daily  . calcium acetate  667 mg Oral TID WC  . Chlorhexidine Gluconate Cloth  6 each Topical Q0600  . cinacalcet  30 mg Oral Q breakfast  . cloNIDine  0.3 mg Transdermal Weekly  . [START ON 10/29/2020] darbepoetin (ARANESP) injection - DIALYSIS  60 mcg Intravenous Q Fri-HD  . doxercalciferol  3 mcg Intravenous Q M,W,F-HD  . enoxaparin (LOVENOX) injection  30 mg Subcutaneous Q48H  . feeding supplement (NEPRO CARB STEADY)  237 mL Oral TID BM  . gabapentin  200 mg Oral QHS  . influenza vac split quadrivalent PF  0.5 mL Intramuscular Tomorrow-1000  . metoprolol tartrate  50 mg Oral BID  . multivitamin  1 tablet Oral QHS  . oxymetazoline  1 spray Each Nare BID  . pantoprazole  40 mg Oral Daily  . senna-docusate  1 tablet Oral BID  . sodium chloride flush  3 mL Intravenous Q12H   . sodium chloride    . sodium chloride    . sodium chloride    . vancomycin 500 mg (10/22/20 1627)   sodium chloride, sodium chloride, sodium chloride, acetaminophen, camphor-menthol, diphenhydrAMINE, HYDROmorphone, ondansetron **OR** ondansetron (ZOFRAN)  IV, oxyCODONE, pentafluoroprop-tetrafluoroeth, sodium chloride flush

## 2020-10-27 NOTE — Progress Notes (Signed)
PROGRESS NOTE    Patrick Anthony  [id number] DOB: Dec 20, 1970 DOA: 09/27/2020 PCP: [name], MD   Chief Complaint  Patient presents with  . 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  Seen by ID managed with IV antibiotic, CT surgery consult salt was obtained subsequently underwent minimally invasive mitral valve replacement on 10/14/2020 and was managed under CT surgery service and subsequently transferred to [facility] on 3/16.  Patient was followed by ID, orthopedics.  He was found to have swelling on the right leg and seen by orthopedic underwent debridement on the right leg and treated for large complicated abscess with osteomyelitis involving his tibia with 7 by X.3 centimeter area of bone that was excised. Patient was seen by Dr. Sharol Given orthopedic ID and the right above-the-knee amputation has been recommended moving forward to manage this infection. Patient has been told by multiple providers multiple times the need for the above-the-knee amputation to avoid seeding of his new valve and his graft but has been reluctant and he is aware about dying from staph aureus bacteremia and sepsis.  Subjective: Seen and examined this morning.  Noted worsening WBC count. No other new complaints.  Reports she is trying to get in touch with his primary care doctor to discuss about his amputation.  He does not want his leg-he is not being given any other alternative or any other options besides amputation.   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  Followed by Dr. Sharol Given, vascular,CT surgery.  At this time remains afebrile but has leukocytosis repeat blood cultures from 10/13/20 are negative. Patient has been advised to undergo right BKA to avoid seeding of his new valve and skin graft.  He is reluctant to go through amputation he is aware about dying from Staph bacteremia and sepsis although he is on antibiotics therapy and even with suppressive therapy he will eventually fail as per ID.  He is trying to get in touch with his primary care doctor to discuss about amputation.  ESRD and HD [id number] dialysis with left upper extremity AVG. Secondary hyperparathyroidism-calcium and phosphorus stable continue Sensipar binders and Hectorol per nephrology.  Anemia of chronic kidney disease continue darbepoetin.  Discharge prostate PRBC 3/10.  Monitor H&H.  No IV iron due to osteomyelitis. Recent Labs  Lab 10/23/20 0144 10/24/20 0341 10/25/20 0154 10/26/20 0138 10/27/20 0122  HGB 9.8* 9.6* 8.9* 9.7* 8.9*  HCT 31.2* 29.5* 27.9* 30.3* 27.4*   History of hepatitis C/liver cirrhosis-outpatient follow-up with ID.  Hyponatremia sodium 129.Cont HD.  Hypertension blood pressure is controlled on metoprolol and clonidine patch.  Nasal bleeding on 3/20 resolved.  Diet Order            Diet renal with fluid  restriction Fluid restriction: 1200 mL Fluid; Room service appropriate? Yes; Fluid consistency: Thin  Diet effective now                 Nutrition Problem: Increased nutrient needs Etiology: chronic illness (ESRD on  HD) Signs/Symptoms: estimated needs Interventions: Nepro shake,Refer to RD note for recommendations Patient's Body mass index is 22.97 kg/m.  DVT prophylaxis: SCDs Start: 10/20/20 1721 enoxaparin (LOVENOX) injection 30 mg Start: 10/19/20 0800 SCDs Start: 10/15/20 0610 Code Status:   Code Status: Full Code  Family Communication:plan of care discussed with patient at bedside.  I called [name] with his permission and updated. She understands that he needs Amputation and she going to talk to him again. Pt has told taht he si going to sleep on it tonight.  Status [id number] Remains inpatient appropriate because:IV treatments appropriate due to intensity of illness or inability to take PO and Inpatient level of care appropriate due to severity of illness  Dispo:The patient is from: Home            Anticipated d/c is to: Home            Patient currently is not medically stable to d/c.            Difficult to place patient No  Unresulted Labs (From admission, onward)          Start     Ordered   10/27/20 1200  Vancomycin, random  Once,   R       Comments: Pre-HD Vanc random to be drawn by HD RN prior to HD session on 3/23   Question:  Specimen collection method  Answer:  Lab=Lab collect   10/26/20 1453   10/21/20 0500  Renal function panel  Daily,   R     Question:  Specimen collection method  Answer:  Lab=Lab collect   10/20/20 2128   10/21/20 0500  CBC with Differential/Platelet  Daily,   R     Question:  Specimen collection method  Answer:  Lab=Lab collect   10/20/20 2128   Signed and Held  Renal function panel  Once,   R       Question:  Specimen collection method  Answer:  Lab=Lab collect   Signed and Held   Signed and Held  CBC  Once,   R       Question:  Specimen collection method  Answer:  Lab=Lab collect   Signed and Held         Medications reviewed:  Scheduled Meds: . (feeding supplement) PROSource Plus  30 mL Oral BID BM  . aspirin EC  325 mg Oral  Daily  . calcium acetate  667 mg Oral TID WC  . Chlorhexidine Gluconate Cloth  6 each Topical Q0600  . cinacalcet  30 mg Oral Q breakfast  . cloNIDine  0.3 mg Transdermal Weekly  . [START ON 10/29/2020] darbepoetin (ARANESP) injection - DIALYSIS  60 mcg Intravenous Q Fri-HD  . doxercalciferol  3 mcg Intravenous Q M,W,F-HD  . enoxaparin (LOVENOX) injection  30 mg Subcutaneous Q48H  . feeding supplement (NEPRO CARB STEADY)  237 mL Oral TID BM  . gabapentin  200 mg Oral QHS  . influenza vac split quadrivalent PF  0.5 mL Intramuscular Tomorrow-1000  . metoprolol tartrate  50 mg Oral BID  . multivitamin  1 tablet Oral QHS  . oxymetazoline  1 spray Each Nare BID  . pantoprazole  40 mg Oral Daily  .  senna-docusate  1 tablet Oral BID  . sodium chloride flush  3 mL Intravenous Q12H   Continuous Infusions: . sodium chloride    . sodium chloride    . sodium chloride    . vancomycin 500 mg (10/22/20 1627)    Consultants:see note  Procedures:see note  Antimicrobials: Anti-infectives (From admission, onward)   Start     Dose/Rate Route Frequency Ordered Stop   10/25/20 0922  vancomycin (VANCOCIN) 500-5 MG/100ML-% IVPB       Note to Pharmacy: Kalman Shan   : cabinet override      10/25/20 0922 10/25/20 0958   10/21/20 0600  ceFAZolin (ANCEF) IVPB 2g/100 mL premix        2 g 200 mL/hr over 30 Minutes Intravenous On call to O.R. 10/20/20 1349 10/21/20 0650   10/20/20 2243  vancomycin (VANCOCIN) 500-5 MG/100ML-% IVPB       Note to Pharmacy: Wallace Cullens   : cabinet override      10/20/20 2243 10/20/20 2243   10/20/20 1437  ceFAZolin (ANCEF) 2-4 GM/100ML-% IVPB       Note to Pharmacy: Ladoris Gene   : cabinet override      10/20/20 1437 10/20/20 1612   10/20/20 1200  vancomycin (VANCOREADY) IVPB 500 mg/100 mL        500 mg 100 mL/hr over 60 Minutes Intravenous Every M-W-F (Hemodialysis) 10/20/20 0903     10/18/20 1124  vancomycin variable dose per unstable renal function (pharmacist  dosing)  Status:  Discontinued         Does not apply See admin instructions 10/18/20 1124 10/20/20 0903   10/16/20 1600  vancomycin (VANCOREADY) IVPB 500 mg/100 mL  Status:  Discontinued        500 mg 100 mL/hr over 60 Minutes Intravenous Every Sat (Hemodialysis) 10/16/20 1306 10/16/20 1312   10/15/20 1500  vancomycin (VANCOCIN) IVPB 500 mg/100 ml premix  Status:  Discontinued        500 mg 100 mL/hr over 60 Minutes Intravenous Every M-W-F (Hemodialysis) 10/15/20 1357 10/17/20 0942   10/14/20 2130  vancomycin (VANCOCIN) IVPB 1000 mg/200 mL premix  Status:  Discontinued        1,000 mg 200 mL/hr over 60 Minutes Intravenous  Once 10/14/20 1403 10/14/20 1547   10/14/20 2130  vancomycin (VANCOREADY) IVPB 500 mg/100 mL        500 mg 100 mL/hr over 60 Minutes Intravenous  Once 10/14/20 1547 10/14/20 2222   10/14/20 1730  cefUROXime (ZINACEF) 1.5 g in sodium chloride 0.9 % 100 mL IVPB        1.5 g 200 mL/hr over 30 Minutes Intravenous Every 24 hr x 2 10/14/20 1403 10/15/20 1718   10/14/20 1128  vancomycin (VANCOCIN) 1,000 mg in sodium chloride 0.9 % 1,000 mL irrigation  Status:  Discontinued          As needed 10/14/20 1128 10/14/20 1401   10/14/20 0400  vancomycin (VANCOREADY) IVPB 1250 mg/250 mL        1,250 mg 166.7 mL/hr over 90 Minutes Intravenous To Surgery 10/13/20 1039 10/14/20 0910   10/14/20 0400  cefUROXime (ZINACEF) 1.5 g in sodium chloride 0.9 % 100 mL IVPB        1.5 g 200 mL/hr over 30 Minutes Intravenous To Surgery 10/13/20 1039 10/14/20 0835   10/14/20 0400  cefUROXime (ZINACEF) 750 mg in sodium chloride 0.9 % 100 mL IVPB        750 mg 200 mL/hr over 30  Minutes Intravenous To Surgery 10/13/20 1039 10/14/20 1250   10/14/20 0400  vancomycin (VANCOCIN) 1,000 mg in sodium chloride 0.9 % 1,000 mL irrigation  Status:  Discontinued         Irrigation To Surgery 10/13/20 1039 10/14/20 1403   10/13/20 1657  vancomycin (VANCOCIN) 500-5 MG/100ML-% IVPB       Note to Pharmacy: Judieth Keens  : cabinet override      10/13/20 1657 10/13/20 1717   10/12/20 0600  ceFAZolin (ANCEF) IVPB 2g/100 mL premix        2 g 200 mL/hr over 30 Minutes Intravenous On call to O.R. 10/11/20 1841 10/12/20 1315   10/11/20 1415  vancomycin (VANCOCIN) IVPB 500 mg/100 ml premix  Status:  Discontinued        500 mg 100 mL/hr over 60 Minutes Intravenous Every M-W-F (Hemodialysis) 10/11/20 1413 10/14/20 1403   10/11/20 1413  vancomycin (VANCOCIN) 500-5 MG/100ML-% IVPB       Note to Pharmacy: Kalman Shan   : cabinet override      10/11/20 1413 10/12/20 0229   10/11/20 1353  Vancomycin (VANCOCIN) 750-5 MG/150ML-% IVPB  Status:  Discontinued       Note to Pharmacy: Kalman Shan   : cabinet override      10/11/20 1353 10/11/20 1417   10/08/20 1200  vancomycin (VANCOCIN) IVPB 750 mg/150 ml premix  Status:  Discontinued        750 mg 150 mL/hr over 60 Minutes Intravenous Every M-W-F (Hemodialysis) 10/05/20 1404 10/11/20 1413   10/06/20 1619  vancomycin (VANCOCIN) 500-5 MG/100ML-% IVPB       Note to Pharmacy: Judieth Keens  : cabinet override      10/06/20 1619 10/06/20 1736   10/06/20 1200  vancomycin (VANCOCIN) IVPB 750 mg/150 ml premix  Status:  Discontinued        750 mg 150 mL/hr over 60 Minutes Intravenous Every M-W-F (Hemodialysis) 10/04/20 0905 10/05/20 1404   10/06/20 1200  vancomycin (VANCOCIN) IVPB 500 mg/100 ml premix        500 mg 100 mL/hr over 60 Minutes Intravenous Every Wed (Hemodialysis) 10/05/20 1404 10/06/20 2000   10/01/20 2000  DAPTOmycin (CUBICIN) 840 mg in sodium chloride 0.9 % IVPB  Status:  Discontinued        840 mg 233.6 mL/hr over 30 Minutes Intravenous Every 48 hours 10/01/20 0922 10/01/20 1056   10/01/20 1200  vancomycin (VANCOCIN) IVPB 1000 mg/200 mL premix  Status:  Discontinued        1,000 mg 200 mL/hr over 60 Minutes Intravenous Every M-W-F (Hemodialysis) 10/01/20 1056 10/04/20 0845   09/29/20 1200  vancomycin (VANCOCIN) IVPB 1000 mg/200 mL premix   Status:  Discontinued        1,000 mg 200 mL/hr over 60 Minutes Intravenous Every M-W-F (Hemodialysis) 09/27/20 1443 10/01/20 0922   09/29/20 1200  ceFEPIme (MAXIPIME) 2 g in sodium chloride 0.9 % 100 mL IVPB  Status:  Discontinued        2 g 200 mL/hr over 30 Minutes Intravenous Every M-W-F (Hemodialysis) 09/27/20 1443 09/28/20 1054   09/28/20 0900  metroNIDAZOLE (FLAGYL) IVPB 500 mg  Status:  Discontinued        500 mg 100 mL/hr over 60 Minutes Intravenous Every 12 hours 09/27/20 1808 09/28/20 1054   09/28/20 0900  ceFEPIme (MAXIPIME) 1 g in sodium chloride 0.9 % 100 mL IVPB  Status:  Discontinued        1 g 200 mL/hr over 30  Minutes Intravenous Every 12 hours 09/27/20 1808 09/27/20 1812   09/27/20 1445  vancomycin (VANCOREADY) IVPB 2000 mg/400 mL        2,000 mg 200 mL/hr over 120 Minutes Intravenous  Once 09/27/20 1433 09/27/20 2330   09/27/20 1430  ceFEPIme (MAXIPIME) 2 g in sodium chloride 0.9 % 100 mL IVPB        2 g 200 mL/hr over 30 Minutes Intravenous  Once 09/27/20 1418 09/27/20 1541   09/27/20 1430  metroNIDAZOLE (FLAGYL) IVPB 500 mg        500 mg 100 mL/hr over 60 Minutes Intravenous  Once 09/27/20 1418 09/27/20 2214   09/27/20 1430  vancomycin (VANCOREADY) IVPB 1750 mg/350 mL  Status:  Discontinued        1,750 mg 175 mL/hr over 120 Minutes Intravenous  Once 09/27/20 1418 09/27/20 1433     Culture/Microbiology    Component Value Date/Time   SDES TISSUE 10/20/2020 1612   SPECREQUEST RIGHT LEG ABSC SPEC A 10/20/2020 1612   CULT  10/20/2020 1612    FEW METHICILLIN RESISTANT STAPHYLOCOCCUS AUREUS CRITICAL RESULT CALLED TO, READ BACK BY AND VERIFIED WITH: RN N.CROSFON AT 0954 ON 10/22/2020 BY T.SAAD NO ANAEROBES ISOLATED Performed at [facility], [place] [address], [place],  [address]    REPTSTATUS 10/26/2020 FINAL 10/20/2020 1612    Other culture-see note  Objective: Vitals: Today's Vitals   10/27/20 0550 10/27/20 0810 10/27/20 1240 10/27/20 1331  BP:   110/77 130/90   Pulse:  81 80   Resp:  15 18   Temp:  98 F (36.7 C) 97.7 F (36.5 C)   TempSrc:  Oral Oral   SpO2:  100% 93%   Weight:      Height:      PainSc: Asleep 4  6  0-No pain   No intake or output data in the 24 hours ending 10/27/20 1428 Filed Weights   10/25/20 1100 10/26/20 0345 10/27/20 0447  Weight: 74.5 kg 76.1 kg 76.8 kg   Weight change: -1.161 kg  Intake/Output from previous day: No intake/output data recorded. Intake/Output this shift: No intake/output data recorded. Filed Weights   10/25/20 1100 10/26/20 0345 10/27/20 0447  Weight: 74.5 kg 76.1 kg 76.8 kg    Examination: General exam: AAOx4, not in acute distress, somewhat with low mood and depressed appearance. HEENT:Oral mucosa moist, Ear/Nose WNL grossly,dentition normal. Respiratory system: bilaterally diminished,no use of accessory muscle, non tender. Cardiovascular system: S1 & S2 +, regular, No JVD. Gastrointestinal system: Abdomen soft, NT,ND, BS+. Nervous System:Alert, awake, moving extremities and grossly nonfocal Extremities: No edema, distal peripheral pulses palpable.  Skin: No rashes,no icterus. MSK: Normal muscle bulk,tone, power  Data Reviewed: I have personally reviewed following labs and imaging studies CBC: Recent Labs  Lab 10/23/20 0144 10/24/20 0341 10/25/20 0154 10/26/20 0138 10/27/20 0122  WBC 12.7* 11.9* 12.5* 11.5* 14.2*  NEUTROABS 9.1* 8.7* 8.9* 8.2* 10.6*  HGB 9.8* 9.6* 8.9* 9.7* 8.9*  HCT 31.2* 29.5* 27.9* 30.3* 27.4*  MCV 90.4 88.1 89.1 89.6 89.5  PLT 362 424* 414* 435* [id number]*   Basic Metabolic Panel: Recent Labs  Lab 10/23/20 0144 10/24/20 0341 10/25/20 0154 10/26/20 0138 10/27/20 0122  NA 131* 129* 130* 132* 129*  K 3.9 4.3 4.3 3.6 4.0  CL 94* 93* 92* 93* 90*  CO2 '27 25 26 28 26  '$ GLUCOSE 96 109* 97 116* 108*  BUN 21* 38* 48* 34* 50*  CREATININE 4.44* 6.56* 8.37* 6.03* 7.90*  CALCIUM  8.1* 8.4* 8.2* 8.7* 8.5*  PHOS 3.8 4.8* 5.2* 4.7* 5.4*    GFR: Estimated Creatinine Clearance: 12.3 mL/min (A) (by C-G formula based on SCr of 7.9 mg/dL (H)). Liver Function Tests: Recent Labs  Lab 10/23/20 0144 10/24/20 0341 10/25/20 0154 10/26/20 0138 10/27/20 0122  ALBUMIN 2.1* 2.1* 2.0* 2.1* 2.1*   No results for input(s): LIPASE, AMYLASE in the last 168 hours. No results for input(s): AMMONIA in the last 168 hours. Coagulation Profile: No results for input(s): INR, PROTIME in the last 168 hours. Cardiac Enzymes: No results for input(s): CKTOTAL, CKMB, CKMBINDEX, TROPONINI in the last 168 hours. BNP (last 3 results) No results for input(s): PROBNP in the last 8760 hours. HbA1C: No results for input(s): HGBA1C in the last 72 hours. CBG: No results for input(s): GLUCAP in the last 168 hours. Lipid Profile: No results for input(s): CHOL, HDL, LDLCALC, TRIG, CHOLHDL, LDLDIRECT in the last 72 hours. Thyroid Function Tests: No results for input(s): TSH, T4TOTAL, FREET4, T3FREE, THYROIDAB in the last 72 hours. Anemia Panel: No results for input(s): VITAMINB12, FOLATE, FERRITIN, TIBC, IRON, RETICCTPCT in the last 72 hours. Sepsis Labs: No results for input(s): PROCALCITON, LATICACIDVEN in the last 168 hours.  Recent Results (from the past 240 hour(s))  Aerobic/Anaerobic Culture w Gram Stain (surgical/deep wound)     Status: None   Collection Time: 10/20/20  4:12 PM   Specimen: Soft Tissue, Other  Result Value Ref Range Status   Specimen Description TISSUE  Final   Special Requests RIGHT LEG ABSC SPEC A  Final   Gram Stain   Final    RARE WBC PRESENT,BOTH PMN AND MONONUCLEAR NO ORGANISMS SEEN    Culture   Final    FEW METHICILLIN RESISTANT STAPHYLOCOCCUS AUREUS CRITICAL RESULT CALLED TO, READ BACK BY AND VERIFIED WITH: RN N.CROSFON AT [id number] ON 10/22/2020 BY T.SAAD NO ANAEROBES ISOLATED Performed at [facility], [place] [address], [place], [place] [address]    Report Status 10/26/2020 FINAL  Final   Organism ID, Bacteria  METHICILLIN RESISTANT STAPHYLOCOCCUS AUREUS  Final      Susceptibility   Methicillin resistant staphylococcus aureus - MIC*    CIPROFLOXACIN >=8 RESISTANT Resistant     ERYTHROMYCIN >=8 RESISTANT Resistant     GENTAMICIN <=0.5 SENSITIVE Sensitive     OXACILLIN >=4 RESISTANT Resistant     TETRACYCLINE <=1 SENSITIVE Sensitive     VANCOMYCIN 1 SENSITIVE Sensitive     TRIMETH/SULFA <=10 SENSITIVE Sensitive     CLINDAMYCIN <=0.25 SENSITIVE Sensitive     RIFAMPIN <=0.5 SENSITIVE Sensitive     Inducible Clindamycin NEGATIVE Sensitive     * FEW METHICILLIN RESISTANT STAPHYLOCOCCUS AUREUS  Fungus Stain     Status: None   Collection Time: 10/20/20  4:12 PM   Specimen: Soft Tissue, Other  Result Value Ref Range Status   FUNGUS STAIN Final report  Final    Comment: (NOTE) Performed At: [facility] [address] [place], [place] [id number] Rush Farmer MD [id number]    Fungal Source TISSUE  Final    Comment: RIGHT LEG ABSC SPEC A Performed at [facility], [name] [address], [place], [place] [address]   Fungal Stain reflex     Status: None   Collection Time: 10/20/20  4:12 PM  Result Value Ref Range Status   Fungal stain result 1 Comment  Final    Comment: (NOTE) KOH/Calcofluor preparation:  no fungus observed. Performed At: [facility] [place],  [place] [id number] Rush Farmer MD [id number]    Radiology Studies: No results found.  LOS: 30 days   Antonieta Pert, MD [facility]  10/27/2020, 2:28 PM

## 2020-10-28 ENCOUNTER — Inpatient Hospital Stay: Payer: Medicaid Other | Admitting: Internal Medicine

## 2020-10-28 ENCOUNTER — Other Ambulatory Visit: Payer: Self-pay | Admitting: Physician Assistant

## 2020-10-28 DIAGNOSIS — I6310 Cerebral infarction due to embolism of unspecified precerebral artery: Secondary | ICD-10-CM | POA: Diagnosis not present

## 2020-10-28 DIAGNOSIS — B9561 Methicillin susceptible Staphylococcus aureus infection as the cause of diseases classified elsewhere: Secondary | ICD-10-CM | POA: Diagnosis not present

## 2020-10-28 DIAGNOSIS — Z7189 Other specified counseling: Secondary | ICD-10-CM

## 2020-10-28 DIAGNOSIS — D631 Anemia in chronic kidney disease: Secondary | ICD-10-CM | POA: Diagnosis not present

## 2020-10-28 DIAGNOSIS — R531 Weakness: Secondary | ICD-10-CM | POA: Diagnosis not present

## 2020-10-28 DIAGNOSIS — N2581 Secondary hyperparathyroidism of renal origin: Secondary | ICD-10-CM | POA: Diagnosis not present

## 2020-10-28 DIAGNOSIS — I132 Hypertensive heart and chronic kidney disease with heart failure and with stage 5 chronic kidney disease, or end stage renal disease: Secondary | ICD-10-CM | POA: Diagnosis not present

## 2020-10-28 DIAGNOSIS — Q612 Polycystic kidney, adult type: Secondary | ICD-10-CM | POA: Diagnosis not present

## 2020-10-28 DIAGNOSIS — M86462 Chronic osteomyelitis with draining sinus, left tibia and fibula: Secondary | ICD-10-CM | POA: Diagnosis not present

## 2020-10-28 DIAGNOSIS — Z953 Presence of xenogenic heart valve: Secondary | ICD-10-CM | POA: Diagnosis not present

## 2020-10-28 DIAGNOSIS — I330 Acute and subacute infective endocarditis: Secondary | ICD-10-CM | POA: Diagnosis not present

## 2020-10-28 DIAGNOSIS — I5032 Chronic diastolic (congestive) heart failure: Secondary | ICD-10-CM | POA: Diagnosis not present

## 2020-10-28 DIAGNOSIS — I639 Cerebral infarction, unspecified: Secondary | ICD-10-CM | POA: Diagnosis not present

## 2020-10-28 DIAGNOSIS — J189 Pneumonia, unspecified organism: Secondary | ICD-10-CM | POA: Diagnosis not present

## 2020-10-28 DIAGNOSIS — M86461 Chronic osteomyelitis with draining sinus, right tibia and fibula: Secondary | ICD-10-CM | POA: Diagnosis not present

## 2020-10-28 DIAGNOSIS — R0602 Shortness of breath: Secondary | ICD-10-CM | POA: Diagnosis not present

## 2020-10-28 DIAGNOSIS — Z515 Encounter for palliative care: Secondary | ICD-10-CM

## 2020-10-28 DIAGNOSIS — J984 Other disorders of lung: Secondary | ICD-10-CM | POA: Diagnosis not present

## 2020-10-28 DIAGNOSIS — L02415 Cutaneous abscess of right lower limb: Secondary | ICD-10-CM | POA: Diagnosis not present

## 2020-10-28 DIAGNOSIS — Z992 Dependence on renal dialysis: Secondary | ICD-10-CM | POA: Diagnosis not present

## 2020-10-28 DIAGNOSIS — Z9889 Other specified postprocedural states: Secondary | ICD-10-CM | POA: Diagnosis not present

## 2020-10-28 DIAGNOSIS — L0291 Cutaneous abscess, unspecified: Secondary | ICD-10-CM | POA: Diagnosis not present

## 2020-10-28 DIAGNOSIS — R7881 Bacteremia: Secondary | ICD-10-CM | POA: Diagnosis not present

## 2020-10-28 DIAGNOSIS — A419 Sepsis, unspecified organism: Secondary | ICD-10-CM | POA: Diagnosis not present

## 2020-10-28 DIAGNOSIS — I340 Nonrheumatic mitral (valve) insufficiency: Secondary | ICD-10-CM | POA: Diagnosis not present

## 2020-10-28 DIAGNOSIS — N186 End stage renal disease: Secondary | ICD-10-CM | POA: Diagnosis not present

## 2020-10-28 LAB — CBC WITH DIFFERENTIAL/PLATELET
Abs Immature Granulocytes: 0.09 10*3/uL — ABNORMAL HIGH (ref 0.00–0.07)
Basophils Absolute: 0 10*3/uL (ref 0.0–0.1)
Basophils Relative: 0 %
Eosinophils Absolute: 0.2 10*3/uL (ref 0.0–0.5)
Eosinophils Relative: 2 %
HCT: 32.3 % — ABNORMAL LOW (ref 39.0–52.0)
Hemoglobin: 10 g/dL — ABNORMAL LOW (ref 13.0–17.0)
Immature Granulocytes: 1 %
Lymphocytes Relative: 9 %
Lymphs Abs: 1 10*3/uL (ref 0.7–4.0)
MCH: 27.9 pg (ref 26.0–34.0)
MCHC: 31 g/dL (ref 30.0–36.0)
MCV: 90 fL (ref 80.0–100.0)
Monocytes Absolute: 1.6 10*3/uL — ABNORMAL HIGH (ref 0.1–1.0)
Monocytes Relative: 15 %
Neutro Abs: 8 10*3/uL — ABNORMAL HIGH (ref 1.7–7.7)
Neutrophils Relative %: 73 %
Platelets: 415 10*3/uL — ABNORMAL HIGH (ref 150–400)
RBC: 3.59 MIL/uL — ABNORMAL LOW (ref 4.22–5.81)
RDW: 17.5 % — ABNORMAL HIGH (ref 11.5–15.5)
WBC: 10.9 10*3/uL — ABNORMAL HIGH (ref 4.0–10.5)
nRBC: 0 % (ref 0.0–0.2)

## 2020-10-28 NOTE — Progress Notes (Signed)
PROGRESS NOTE    Patrick Anthony  [id number] DOB: [birth year] DOA: 09/27/2020 PCP: [name], MD   Chief Complaint  Patient presents with  . 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  Seen by ID managed with IV antibiotic, CT surgery consult salt was obtained subsequently underwent minimally invasive mitral valve replacement on 10/14/2020 and was managed under CT surgery service and subsequently transferred to [facility] on [facility].  Patient was followed by ID, orthopedics.  He was found to have swelling on the right leg and seen by orthopedic underwent debridement on the right leg and treated for large complicated abscess with osteomyelitis involving his tibia with 7 by X.3 centimeter area of bone that was excised. Patient was seen by Dr. Sharol Given orthopedic ID and the right above-the-knee amputation has been recommended moving forward to manage this infection. Patient has been told by multiple providers multiple times the need for the above-the-knee amputation to avoid seeding of his new valve and his graft but has been reluctant and he is aware about dying from staph aureus bacteremia and sepsis.  Subjective:  Seen this morning.  Patient reports that he had made his decision to move forward with amputation and vascular is notified.  He is alert awake oriented asking for some pain.   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  Followed by Dr. Sharol Given, vascular,CT surgery.  At this time remains afebrile but has leukocytosis repeat blood cultures from 10/13/20 are negative. Patient has been advised to undergo right AKA to avoid seeding of his new valve and skin graft.  He s reluctahas now changed his decision and wants to proceed with AKA, vascular surgery notified keep n.p.o. past midnight.    ESRD and HD [id number] dialysis with left upper extremity AVG. nephrology on board. Secondary hyperparathyroidism-calcium and phosphorus stable continue Sensipar binders and Hectorol per nephrology.  Monitor labs.  Anemia of chronic kidney disease continue darbepoetin.  Monitor H&H.  No IV iron due to osteomyelitis.  Status post 2 unit PRBC so far. Recent Labs  Lab 10/24/20 0341 10/25/20 0154 10/26/20 0138 10/27/20 0122 10/28/20 0152  HGB 9.6* 8.9* 9.7* 8.9* 10.0*  HCT 29.5* 27.9* 30.3* 27.4* 32.3*   History of hepatitis C/liver cirrhosis-outpatient follow-up with ID.  Compensated.  Monitor LFTs intermittently.  He has very low albumin. Recent Labs  Lab 10/24/20 0341 10/25/20 0154 10/26/20 0138 10/27/20 0122 10/28/20 0152  ALBUMIN 2.1* 2.0* 2.1* 2.1* 2.2*   Hyponatremia sodium improved to 132. Adjust with dialysis.  Hypertension controlled on metoprolol and clonidine patch.  Nasal bleeding on 3/20 resolved.  Diet Order            Diet renal with fluid restriction Fluid restriction: 1200 mL Fluid; Room  service appropriate? Yes; Fluid consistency: Thin  Diet effective now                 Nutrition Problem: Increased nutrient needs Etiology: chronic illness (ESRD on HD) Signs/Symptoms: estimated  needs Interventions: Nepro shake,Refer to RD note for recommendations Patient's Body mass index is 22.68 kg/m.  DVT prophylaxis: SCDs Start: 10/20/20 1721 enoxaparin (LOVENOX) injection 30 mg Start: 10/19/20 0800 SCDs Start: 10/15/20 0610 Code Status:   Code Status: Full Code  Family Communication:plan of care discussed with patient at bedside. I had discussed with [name] yesterday.  Status [id number] Remains inpatient appropriate because:IV treatments appropriate due to intensity of illness or inability to take PO and Inpatient level of care appropriate due to severity of illness  Dispo:The patient is from: Home            Anticipated d/c is to: Home            Patient currently is not medically stable to d/c.            Difficult to place patient No  Unresulted Labs (From admission, onward)          Start     Ordered   10/21/20 0500  Renal function panel  Daily,   R     Question:  Specimen collection method  Answer:  Lab=Lab collect   10/20/20 2128   10/21/20 0500  CBC with Differential/Platelet  Daily,   R     Question:  Specimen collection method  Answer:  Lab=Lab collect   10/20/20 2128   Signed and Held  Renal function panel  Once,   R       Question:  Specimen collection method  Answer:  Lab=Lab collect   Signed and Held         Medications reviewed:  Scheduled Meds: . (feeding supplement) PROSource Plus  30 mL Oral BID BM  . aspirin EC  325 mg Oral Daily  . calcium acetate  667 mg Oral TID WC  . Chlorhexidine Gluconate Cloth  6 each Topical Q0600  . cinacalcet  30 mg Oral Q breakfast  . cloNIDine  0.3 mg Transdermal Weekly  . [START ON 10/29/2020] darbepoetin (ARANESP) injection - DIALYSIS  60 mcg Intravenous Q Fri-HD  . doxercalciferol  3 mcg Intravenous Q M,W,F-HD  . enoxaparin (LOVENOX) injection  30 mg Subcutaneous Q48H  . feeding supplement (NEPRO CARB STEADY)  237 mL Oral TID BM  . gabapentin  200 mg Oral QHS  . influenza vac split  quadrivalent PF  0.5 mL Intramuscular Tomorrow-1000  . metoprolol tartrate  50 mg Oral BID  . multivitamin  1 tablet Oral QHS  . oxymetazoline  1 spray Each Nare BID  . pantoprazole  40 mg Oral Daily  . senna-docusate  1 tablet Oral BID  . sodium chloride flush  3 mL Intravenous Q12H   Continuous Infusions: . sodium chloride    . sodium chloride    . sodium chloride    . vancomycin 750 mg (10/27/20 2116)    Consultants:see note  Procedures:see note  Antimicrobials: Anti-infectives (From admission, onward)   Start     Dose/Rate Route Frequency Ordered Stop   10/27/20 2015  vancomycin (VANCOCIN) IVPB 750 mg/150 ml premix        750 mg 150 mL/hr over 60 Minutes Intravenous Every M-W-F (Hemodialysis) 10/27/20 2005     10/25/20 0922  vancomycin (VANCOCIN) 500-5 MG/100ML-% IVPB  Note to Pharmacy: Kalman Shan   : cabinet override      10/25/20 0922 10/25/20 0958   10/21/20 0600  ceFAZolin (ANCEF) IVPB 2g/100 mL premix        2 g 200 mL/hr over 30 Minutes Intravenous On call to O.R. 10/20/20 1349 10/21/20 0650   10/20/20 2243  vancomycin (VANCOCIN) 500-5 MG/100ML-% IVPB       Note to Pharmacy: Wallace Cullens   : cabinet override      10/20/20 2243 10/20/20 2243   10/20/20 1437  ceFAZolin (ANCEF) 2-4 GM/100ML-% IVPB       Note to Pharmacy: Ladoris Gene   : cabinet override      10/20/20 1437 10/20/20 1612   10/20/20 1200  vancomycin (VANCOREADY) IVPB 500 mg/100 mL  Status:  Discontinued        500 mg 100 mL/hr over 60 Minutes Intravenous Every M-W-F (Hemodialysis) 10/20/20 0903 10/27/20 2005   10/18/20 1124  vancomycin variable dose per unstable renal function (pharmacist dosing)  Status:  Discontinued         Does not apply See admin instructions 10/18/20 1124 10/20/20 0903   10/16/20 1600  vancomycin (VANCOREADY) IVPB 500 mg/100 mL  Status:  Discontinued        500 mg 100 mL/hr over 60 Minutes Intravenous Every Sat (Hemodialysis) 10/16/20 1306 10/16/20 1312   10/15/20  1500  vancomycin (VANCOCIN) IVPB 500 mg/100 ml premix  Status:  Discontinued        500 mg 100 mL/hr over 60 Minutes Intravenous Every M-W-F (Hemodialysis) 10/15/20 1357 10/17/20 0942   10/14/20 2130  vancomycin (VANCOCIN) IVPB 1000 mg/200 mL premix  Status:  Discontinued        1,000 mg 200 mL/hr over 60 Minutes Intravenous  Once 10/14/20 1403 10/14/20 1547   10/14/20 2130  vancomycin (VANCOREADY) IVPB 500 mg/100 mL        500 mg 100 mL/hr over 60 Minutes Intravenous  Once 10/14/20 1547 10/14/20 2222   10/14/20 1730  cefUROXime (ZINACEF) 1.5 g in sodium chloride 0.9 % 100 mL IVPB        1.5 g 200 mL/hr over 30 Minutes Intravenous Every 24 hr x 2 10/14/20 1403 10/15/20 1718   10/14/20 1128  vancomycin (VANCOCIN) 1,000 mg in sodium chloride 0.9 % 1,000 mL irrigation  Status:  Discontinued          As needed 10/14/20 1128 10/14/20 1401   10/14/20 0400  vancomycin (VANCOREADY) IVPB 1250 mg/250 mL        1,250 mg 166.7 mL/hr over 90 Minutes Intravenous To Surgery 10/13/20 1039 10/14/20 0910   10/14/20 0400  cefUROXime (ZINACEF) 1.5 g in sodium chloride 0.9 % 100 mL IVPB        1.5 g 200 mL/hr over 30 Minutes Intravenous To Surgery 10/13/20 1039 10/14/20 0835   10/14/20 0400  cefUROXime (ZINACEF) 750 mg in sodium chloride 0.9 % 100 mL IVPB        750 mg 200 mL/hr over 30 Minutes Intravenous To Surgery 10/13/20 1039 10/14/20 1250   10/14/20 0400  vancomycin (VANCOCIN) 1,000 mg in sodium chloride 0.9 % 1,000 mL irrigation  Status:  Discontinued         Irrigation To Surgery 10/13/20 1039 10/14/20 1403   10/13/20 1657  vancomycin (VANCOCIN) 500-5 MG/100ML-% IVPB       Note to Pharmacy: Judieth Keens  : cabinet override      10/13/20 1657 10/13/20 1717   10/12/20 0600  ceFAZolin (ANCEF) IVPB 2g/100 mL premix        2 g 200 mL/hr over 30 Minutes Intravenous On call to O.R. 10/11/20 1841 10/12/20 1315   10/11/20 1415  vancomycin (VANCOCIN) IVPB 500 mg/100 ml premix  Status:  Discontinued         500 mg 100 mL/hr over 60 Minutes Intravenous Every M-W-F (Hemodialysis) 10/11/20 1413 10/14/20 1403   10/11/20 1413  vancomycin (VANCOCIN) 500-5 MG/100ML-% IVPB       Note to Pharmacy: Kalman Shan   : cabinet override      10/11/20 1413 10/12/20 0229   10/11/20 1353  Vancomycin (VANCOCIN) 750-5 MG/150ML-% IVPB  Status:  Discontinued       Note to Pharmacy: Kalman Shan   : cabinet override      10/11/20 1353 10/11/20 1417   10/08/20 1200  vancomycin (VANCOCIN) IVPB 750 mg/150 ml premix  Status:  Discontinued        750 mg 150 mL/hr over 60 Minutes Intravenous Every M-W-F (Hemodialysis) 10/05/20 1404 10/11/20 1413   10/06/20 1619  vancomycin (VANCOCIN) 500-5 MG/100ML-% IVPB       Note to Pharmacy: Judieth Keens  : cabinet override      10/06/20 1619 10/06/20 1736   10/06/20 1200  vancomycin (VANCOCIN) IVPB 750 mg/150 ml premix  Status:  Discontinued        750 mg 150 mL/hr over 60 Minutes Intravenous Every M-W-F (Hemodialysis) 10/04/20 0905 10/05/20 1404   10/06/20 1200  vancomycin (VANCOCIN) IVPB 500 mg/100 ml premix        500 mg 100 mL/hr over 60 Minutes Intravenous Every Wed (Hemodialysis) 10/05/20 1404 10/06/20 2000   10/01/20 2000  DAPTOmycin (CUBICIN) 840 mg in sodium chloride 0.9 % IVPB  Status:  Discontinued        840 mg 233.6 mL/hr over 30 Minutes Intravenous Every 48 hours 10/01/20 0922 10/01/20 1056   10/01/20 1200  vancomycin (VANCOCIN) IVPB 1000 mg/200 mL premix  Status:  Discontinued        1,000 mg 200 mL/hr over 60 Minutes Intravenous Every M-W-F (Hemodialysis) 10/01/20 1056 10/04/20 0845   09/29/20 1200  vancomycin (VANCOCIN) IVPB 1000 mg/200 mL premix  Status:  Discontinued        1,000 mg 200 mL/hr over 60 Minutes Intravenous Every M-W-F (Hemodialysis) 09/27/20 1443 10/01/20 0922   09/29/20 1200  ceFEPIme (MAXIPIME) 2 g in sodium chloride 0.9 % 100 mL IVPB  Status:  Discontinued        2 g 200 mL/hr over 30 Minutes Intravenous Every M-W-F (Hemodialysis)  09/27/20 1443 09/28/20 1054   09/28/20 0900  metroNIDAZOLE (FLAGYL) IVPB 500 mg  Status:  Discontinued        500 mg 100 mL/hr over 60 Minutes Intravenous Every 12 hours 09/27/20 1808 09/28/20 1054   09/28/20 0900  ceFEPIme (MAXIPIME) 1 g in sodium chloride 0.9 % 100 mL IVPB  Status:  Discontinued        1 g 200 mL/hr over 30 Minutes Intravenous Every 12 hours 09/27/20 1808 09/27/20 1812   09/27/20 1445  vancomycin (VANCOREADY) IVPB 2000 mg/400 mL        2,000 mg 200 mL/hr over 120 Minutes Intravenous  Once 09/27/20 1433 09/27/20 2330   09/27/20 1430  ceFEPIme (MAXIPIME) 2 g in sodium chloride 0.9 % 100 mL IVPB        2 g 200 mL/hr over 30 Minutes Intravenous  Once 09/27/20 1418 09/27/20 1541   09/27/20 1430  metroNIDAZOLE (FLAGYL) IVPB 500 mg        500 mg 100 mL/hr over 60 Minutes Intravenous  Once 09/27/20 1418 09/27/20 2214   09/27/20 1430  vancomycin (VANCOREADY) IVPB 1750 mg/350 mL  Status:  Discontinued        1,750 mg 175 mL/hr over 120 Minutes Intravenous  Once 09/27/20 1418 09/27/20 1433     Culture/Microbiology    Component Value Date/Time   SDES TISSUE 10/20/2020 1612   SPECREQUEST RIGHT LEG ABSC SPEC A 10/20/2020 1612   CULT  10/20/2020 1612    FEW METHICILLIN RESISTANT STAPHYLOCOCCUS AUREUS CRITICAL RESULT CALLED TO, READ BACK BY AND VERIFIED WITH: RN N.CROSFON AT 0954 ON 10/22/2020 BY T.SAAD NO ANAEROBES ISOLATED Performed at [facility], [place] [address], [place], [place] [address]    REPTSTATUS 10/26/2020 FINAL 10/20/2020 1612    Other culture-see note  Objective: Vitals: Today's Vitals   10/27/20 2359 10/28/20 0350 10/28/20 0819 10/28/20 0910  BP:  (!) 125/93  (!) 128/100  Pulse:  88  88  Resp:  14  14  Temp:  97.9 F (36.6 C)  99.7 F (37.6 C)  TempSrc:  Oral  Axillary  SpO2:  97%  99%  Weight:  75.9 kg    Height:      PainSc: 0-No pain  0-No pain     Intake/Output Summary (Last 24 hours) at 10/28/2020 1153 Last data filed at 10/28/2020  0600 Gross per 24 hour  Intake 363 ml  Output 3000 ml  Net -2637 ml   Filed Weights   10/27/20 1840 10/27/20 2218 10/28/20 0350  Weight: 79.9 kg 76.9 kg 75.9 kg   Weight change: 3.061 kg  Intake/Output from previous day: 03/23 0701 - 03/24 0700 In: 363 [P.O.:120; I.V.:3; NG/GT:240] Out: 3000  Intake/Output this shift: No intake/output data recorded. Filed Weights   10/27/20 1840 10/27/20 2218 10/28/20 0350  Weight: 79.9 kg 76.9 kg 75.9 kg   Examination: General exam:AAOx3,NAD,weak appearing. HEENT:Oral mucosa moist,Ear/Nose WNL grossly,dentition normal. Respiratory system:Bilaterally diminishedd,no wheezing or crackles,no use of accessory muscle. Cardiovascular system:S1 & S2 +,No JVD. Gastrointestinal system:Abdomen soft, NT,ND, BS+. Nervous System:Alert, awake, moving extremities and grossly non-focal. Extremities:Rt leg with wound,No edema,distal peripheral pulses palpable.  Skin:No rashes,no icterus. [id number] muscle bulk,tone,power.  Data Reviewed: I have personally reviewed following labs and imaging studies CBC: Recent Labs  Lab 10/24/20 0341 10/25/20 0154 10/26/20 0138 10/27/20 0122 10/28/20 0152  WBC 11.9* 12.5* 11.5* 14.2* 10.9*  NEUTROABS 8.7* 8.9* 8.2* 10.6* 8.0*  HGB 9.6* 8.9* 9.7* 8.9* 10.0*  HCT 29.5* 27.9* 30.3* 27.4* 32.3*  MCV 88.1 89.1 89.6 89.5 90.0  PLT 424* 414* 435* 451* [id number]*   Basic Metabolic Panel: Recent Labs  Lab 10/24/20 0341 10/25/20 0154 10/26/20 0138 10/27/20 0122 10/28/20 0152  NA 129* 130* 132* 129* 132*  K 4.3 4.3 3.6 4.0 3.9  CL 93* 92* 93* 90* 95*  CO2 '25 26 28 26 26  '$ GLUCOSE 109* 97 116* 108* 115*  BUN 38* 48* 34* 50* 22*  CREATININE 6.56* 8.37* 6.03* 7.90* 4.75*  CALCIUM 8.4* 8.2* 8.7* 8.5* 8.5*  PHOS 4.8* 5.2* 4.7* 5.4* 3.2   GFR: Estimated Creatinine Clearance: 20.2 mL/min (A) (by C-G formula based on SCr of 4.75 mg/dL (H)). Liver Function Tests: Recent Labs  Lab 10/24/20 0341 10/25/20 0154 10/26/20 0138  10/27/20 0122 10/28/20 0152  ALBUMIN 2.1* 2.0* 2.1* 2.1* 2.2*   No results for input(s): LIPASE, AMYLASE in the last 168 hours. No results  for input(s): AMMONIA in the last 168 hours. Coagulation Profile: No results for input(s): INR, PROTIME in the last 168 hours. Cardiac Enzymes: No results for input(s): CKTOTAL, CKMB, CKMBINDEX, TROPONINI in the last 168 hours. BNP (last 3 results) No results for input(s): PROBNP in the last 8760 hours. HbA1C: No results for input(s): HGBA1C in the last 72 hours. CBG: No results for input(s): GLUCAP in the last 168 hours. Lipid Profile: No results for input(s): CHOL, HDL, LDLCALC, TRIG, CHOLHDL, LDLDIRECT in the last 72 hours. Thyroid Function Tests: No results for input(s): TSH, T4TOTAL, FREET4, T3FREE, THYROIDAB in the last 72 hours. Anemia Panel: No results for input(s): VITAMINB12, FOLATE, FERRITIN, TIBC, IRON, RETICCTPCT in the last 72 hours. Sepsis Labs: No results for input(s): PROCALCITON, LATICACIDVEN in the last 168 hours.  Recent Results (from the past 240 hour(s))  Aerobic/Anaerobic Culture w Gram Stain (surgical/deep wound)     Status: None   Collection Time: 10/20/20  4:12 PM   Specimen: Soft Tissue, Other  Result Value Ref Range Status   Specimen Description TISSUE  Final   Special Requests RIGHT LEG ABSC SPEC A  Final   Gram Stain   Final    RARE WBC PRESENT,BOTH PMN AND MONONUCLEAR NO ORGANISMS SEEN    Culture   Final    FEW METHICILLIN RESISTANT STAPHYLOCOCCUS AUREUS CRITICAL RESULT CALLED TO, READ BACK BY AND VERIFIED WITH: RN N.CROSFON AT [id number] ON 10/22/2020 BY T.SAAD NO ANAEROBES ISOLATED Performed at [facility], [place] [address], [place], [place] [address]    Report Status 10/26/2020 FINAL  Final   Organism ID, Bacteria METHICILLIN RESISTANT STAPHYLOCOCCUS AUREUS  Final      Susceptibility   Methicillin resistant staphylococcus aureus - MIC*    CIPROFLOXACIN >=8 RESISTANT Resistant     ERYTHROMYCIN >=8  RESISTANT Resistant     GENTAMICIN <=0.5 SENSITIVE Sensitive     OXACILLIN >=4 RESISTANT Resistant     TETRACYCLINE <=1 SENSITIVE Sensitive     VANCOMYCIN 1 SENSITIVE Sensitive     TRIMETH/SULFA <=10 SENSITIVE Sensitive     CLINDAMYCIN <=0.25 SENSITIVE Sensitive     RIFAMPIN <=0.5 SENSITIVE Sensitive     Inducible Clindamycin NEGATIVE Sensitive     * FEW METHICILLIN RESISTANT STAPHYLOCOCCUS AUREUS  Fungus Stain     Status: None   Collection Time: 10/20/20  4:12 PM   Specimen: Soft Tissue, Other  Result Value Ref Range Status   FUNGUS STAIN Final report  Final    Comment: (NOTE) Performed At: [facility] [address] [place], [place] [id number] Rush Farmer MD [id number]    Fungal Source TISSUE  Final    Comment: RIGHT LEG ABSC SPEC A Performed at [facility], [place] [address] [place], [place] [address]   Fungal Stain reflex     Status: None   Collection Time: 10/20/20  4:12 PM  Result Value Ref Range Status   Fungal stain result 1 Comment  Final    Comment: (NOTE) KOH/Calcofluor preparation:  no fungus observed. Performed At: [facility] [place], [place] [id number] Rush Farmer MD [id number]    Radiology Studies: No results found.  LOS: 31 days   Antonieta Pert, MD [facility]  10/28/2020, 11:53 AM

## 2020-10-28 NOTE — Progress Notes (Signed)
   10/28/20 1100  Clinical Encounter Type  Visited With Patient  Visit Type Initial  Referral From Nurse  Consult/Referral To Chaplain  Spiritual Encounters  Spiritual Needs Emotional;Literature  Stress Factors  Patient Stress Factors Major life changes;Health changes  Chaplain responded to consult for [name].  [name] stated all was not well and he has some major decisions to make concerning his leg amputation and other health issues.  We talked about his concerns, his faith beliefs and acknowledged his feelings and where he is emotionally.  Major life changing decisions.  [name] was very transparent about where he is and his thoughts.  Offred support and a listening heart.    Chaplain Vincella Morgan-Simpson [phone]

## 2020-10-28 NOTE — Progress Notes (Signed)
Took dressing down on R leg for patient to see wound per Dr. Sharol Given, dressing was adhered to patients skin with dried blood, the wound bed had a foul odor to it.  The wound bed itself did not look well, no viable tissue noted. Packing placed back into wound and pt tolerated well.  He was able to take pictures of the wound and see the extent of how deep and infected this area looks.

## 2020-10-28 NOTE — Progress Notes (Signed)
Mobility Specialist: Progress Note   10/28/20 1622  Mobility  Activity Ambulated in hall  Level of Assistance Modified independent, requires aide device or extra time  Assistive Device None  Distance Ambulated (ft) 470 ft  Mobility Response Tolerated well  Mobility performed by Mobility specialist  $Mobility charge 1 Mobility   Pt asx during ambulation. Pt states he does not want to move forward with BKA, RN notified. Pt is sitting EOB with family member in room.   [facility] Azlan Hanway Mobility Specialist Mobility Specialist Phone: [phone]

## 2020-10-28 NOTE — Progress Notes (Signed)
Physical Therapy Treatment Patient Details Name: Patrick Anthony MRN: [id number] DOB: Jan 17, 1971 Today's Date: 10/28/2020    History of Present Illness Pt is a 50 y.o. male admitted 09/27/20 with fever, chills, chest pain, SOB, generalized weakness. Pt found to have MRSA bacteremia. 2/22 MRI showed multiple scattered small infarcts (bilateral cerebral hemispheres, R basal ganglia, bileteral cerebellum); suspect embolic process. Echo showed mitral valve vegetation, mild-moderate mitral regurgitation. No significant CAD on L heart cath. 3/8 dental extraction. 3/10 mini MVR with closure of PFO. 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 PMH includes CKD (HD MWF), HTN, CHF, Hep C, polysubstance abuse.    PT Comments    Pt received in supine, with depressed affect but agreeable to therapy session with encouragement and with good participation. Pt progressed gait distance to 67f with NWB on RLE, continues to perform short hops despite cues for press and sway technique with RW unable to clear LLE; mostly min guard but up to minA needed for balance with single leg gait trial. VSS on RA with exertion. Pt continues to benefit from PT services to progress toward functional mobility goals. Continue to recommend HHPT, pending acute course as pt planned for OR next date for AKA.  Follow Up Recommendations  Home health PT (pending medical course)     Equipment Recommendations  Rolling walker with 5" wheels;3in1 (PT)    Recommendations for Other Services       Precautions / Restrictions Precautions Precautions: Fall Restrictions Weight Bearing Restrictions: Yes RLE Weight Bearing: Weight bearing as tolerated    Mobility  Bed Mobility Overal bed mobility: Modified Independent             General bed mobility comments: HOB elevated; use of bed features    Transfers Overall transfer level: Needs assistance Equipment used: Rolling walker (2  wheeled) Transfers: Sit to/from [facility] to Stand: Min guard         General transfer comment: practiced standing from EOB<>RW and to toilet with NWB on RLE due to possibility of AKA in next couple of days; pt able to perform with min guard but at times resting RLE on floor despite cues  Ambulation/Gait Ambulation/Gait assistance: Min guard;Min assist Gait Distance (Feet): 45 Feet Assistive device: Rolling walker (2 wheeled) Gait Pattern/deviations: Step-to pattern;Shuffle;Decreased step length - left (hop-to gait pattern, NWB on RLE) Gait velocity: decreased   General Gait Details: practicing NWB on RLE due to possible upcoming amputation surgery, pt also reporting moderate RLE pain; decreased L foot clearance and tending to shuffle LLE despite cues for press and sway technique to improve step length, unable to fully clear L foot from floor; 1 minor LOB requiring minA to correct, otherwise min guard   Stairs             Wheelchair Mobility    Modified Rankin (Stroke Patients Only)       Balance Overall balance assessment: Needs assistance Sitting-balance support: No upper extremity supported;Feet supported Sitting balance-Leahy Scale: Good     Standing balance support: No upper extremity supported;During functional activity;Bilateral upper extremity supported Standing balance-Leahy Scale: Fair Standing balance comment: used RW due to RLE pain, fair standing balance without RW with BLE support but reliant on BUE support and external assist while NWB                            Cognition Arousal/Alertness: Awake/alert Behavior [facility]  for tasks assessed/performed Overall Cognitive Status: Impaired/Different from baseline Area of Impairment: Safety/judgement;Problem solving;Attention;Awareness;Memory                   Current Attention Level: Divided Memory: Decreased recall of precautions;Decreased short-term  memory Following Commands: Follows one step commands consistently Safety/Judgement: Decreased awareness of safety;Decreased awareness of deficits Awareness: Emergent Problem Solving: Requires verbal cues General Comments: Appropriate during session; answering questions; decreased insight into deficits; Pt following most 1-step commands but some impulsivity noted      Exercises      General Comments General comments (skin integrity, edema, etc.): HR 90-101 bpm during mobility; BP 137/87 (101) seated, UTA standing due to pt impulsive to return to bathroom      Pertinent Vitals/Pain Pain Assessment: Faces Faces Pain Scale: Hurts little more Pain Location: RLE and R pectoral muscle/B triceps due to increased WB with single leg hopping Pain Descriptors / Indicators: Tender;Sore Pain Intervention(s): Limited activity within patient's tolerance;Monitored during session;Repositioned    Home Living                      Prior Function            PT Goals (current goals can now be found in the care plan section) Acute Rehab PT Goals Patient Stated Goal: to return home and be independent PT Goal Formulation: With patient Time For Goal Achievement: 10/31/20 Potential to Achieve Goals: Fair Progress towards PT goals: Progressing toward goals    Frequency    Min 3X/week      PT Plan Current plan remains appropriate    Co-evaluation              AM-PAC PT "6 Clicks" Mobility   Outcome Measure  Help needed turning from your back to your side while in a flat bed without using bedrails?: None Help needed moving from lying on your back to sitting on the side of a flat bed without using bedrails?: None Help needed moving to and from a bed to a chair (including a wheelchair)?: A Little Help needed standing up from a chair using your arms (e.g., wheelchair or bedside chair)?: A Little Help needed to walk in hospital room?: A Little Help needed climbing 3-5 steps with a  railing? : A Lot 6 Click Score: 19    End of Session Equipment Utilized During Treatment: Gait belt Activity Tolerance: Patient tolerated treatment well Patient left: with call bell/phone within reach (on toilet, NT and RN notified) Nurse Communication: Mobility status PT Visit Diagnosis: Difficulty in walking, not elsewhere classified (R26.2);Other abnormalities of gait and mobility (R26.89)     Time: [id number] PT Time Calculation (min) (ACUTE ONLY): 17 min  Charges:  $Gait Training: 8-22 mins                     Lisha Vitale P., PTA Acute Rehabilitation Services Pager: [phone] Office: [place] 10/28/2020, 3:34 PM

## 2020-10-28 NOTE — Progress Notes (Signed)
Nutrition Follow-up  DOCUMENTATION CODES:  Not applicable  INTERVENTION:   Continue Nepro Shake po TID, each supplement provides 425 kcal and 19 grams protein.  Continue 30 ml ProSource Plus po BID, each supplement provides 100 kcal and 15 grams of protein.   Continue Rena-Vite daily.  NUTRITION DIAGNOSIS:  Increased nutrient needs related to chronic illness (ESRD on HD) as evidenced by estimated needs.  Ongoing  GOAL:  Patient will meet greater than or equal to 90% of their needs   Progressing   MONITOR:  PO intake,Supplement acceptance,Skin,I & O's,Diet advancement,Labs,Weight trends  ASSESSMENT:  68 YOM admitted for pneumonia d/t infectious agent. PMH of cirrhosis of liver, drug abuse, ETOH abuse, anemia, CHF, Hepatitis C, HTN, ESRD on HD. Recent onset of sepsis d/t MRSA, acute CVA.   3/10 - s/p minimally invasive mitral valve replacement 3/16 - s/p debridement of a large purulent abscess anterior right tibia and placement of wound VAC  Patient has made decision to go forward with R AKA.   Intake remains stable at 75-100% for his last four meals. Taking Prosource 1-2 times daily and Nepro off/on. Electrolytes now stable. If intake declines, consider liberalization of diet.   EDW: 82.5 kg (new EDW this admission 73-75 kg) Current weight: 75.9 kg   Medications: phoslo, sensipar, aranesp, hectorol, senokot  Labs: Na 132 (L) CBG 108-116  Diet Order:   Diet Order            Diet renal with fluid restriction Fluid restriction: 1200 mL Fluid; Room service appropriate? Yes; Fluid consistency: Thin  Diet effective now                EDUCATION NEEDS:  Education needs have been addressed (RD discussed with pt protein's importance and protein sources)  Skin:  Skin Assessment: Skin Integrity Issues: Skin Integrity Issues:: Wound VAC Wound Vac: On R calf Incisions: R chest, lip, R groin  Last BM:  3/19  Height:  Ht Readings from Last 1 Encounters:  10/20/20 6'  0.01" (1.829 m)   Weight:  Wt Readings from Last 1 Encounters:  10/28/20 75.9 kg   Ideal Body Weight:  81 kg  BMI:  Body mass index is 22.68 kg/m.  Estimated Nutritional Needs:  Kcal:  2200-2400 Protein:  105-120 g Fluid:  1.2 L  Mariana Single RD, LDN Clinical Nutrition Pager listed in [place]

## 2020-10-28 NOTE — Progress Notes (Signed)
Spoke with Dr. Sharol Given about patient deciding to go forward with AKA, per Dr. Sharol Given we will plan for OR tomorrow.

## 2020-10-28 NOTE — Consult Note (Signed)
Consultation Note Date: 10/28/2020   Patient Name: Patrick Anthony  DOB: [birth year]  MRN: [id number]  Age / Sex: 50 y.o., male   PCP: [name], MD Referring Physician: Antonieta Pert, MD   REASON FOR CONSULTATION:Establishing goals of care  Palliative Care consult requested for goals of care discussion in this 50 y.o. male with past medical history of ESRD (MWF dialysis), hypertension, diastolic CHF, liver cirrhosis, hepatitis C, EF 55-60%, and alcohol abuse. He presented to the ER from home with complaints of generalized weakness. He was initially admitted and received IV antibiotics and treatment for MRSA bacteremia. Subsequently patient began having symptoms and Code stroke called. CT head showed embolic stroke. Blood cultures positive for staph aureus. TEE concerning for mitral valve endocarditis due to large vegetation. Patient followed by TCS  And underwent mitral valve replacement (10/14/2020). Followed by orthopedics in the setting of right leg swelling and large abscess with osteomyelitis. Debridement performed with recommendations for right above-the-knee amputation.   Clinical Assessment and Goals of Care: I have reviewed medical records including lab results, imaging, Epic notes, and MAR, received report from the bedside RN, and assessed the patient.   I met at the bedside with [name] to discuss diagnosis prognosis, [employer], EOL wishes, disposition and options.  He is awake, alert and oriented. Watching tv. Complains of some pain and discomfort in that right leg.   I introduced Palliative Medicine as specialized medical care for people living with serious illness. It focuses on providing relief from the symptoms and stress of a serious illness. The goal is to improve quality of life for both the patient and the family. He verbalized understanding and appreciation.   We discussed a brief life review of the patient, along with his functional and nutritional status. [name] states  he is single. He has 2 daughters who reside in his home town. He reports all of his family resides there. Both of his parents are deceased. 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 Enjoys sports.   Prior to admission patient reports he was living alone. He was able to perform all ADLs and driving himself to dialysis.   We discussed His current illness and what it means in the larger context of His on-going co-morbidities. Natural disease trajectory and expectations at EOL were discussed.  [name] is tearful when speaking about his current illness. He states he has 2 daughters to live for and is thankful that God has allowed him to live through all that he has encountered over the past month while hospitalized.   He states "this is a hard pill to swallow!" referring to the need to have his leg amputated. He speaks to always hearing or knowing someone in critical condition but knowing it is him that is now basically fighting for his life hits home. Emotional support provided.   [name]'s expressed goals are clear. He is hopeful that he will improve. He has made the difficult decision and wishes to proceed with amputation. He states he would like to allow every opportunity to live and be able to see and spend time with his family again although he know life will be very challenging and change once he has the surgery. He is relying on his faith, family, and the medical team to get him through his recovery.   A detailed discussion was had today regarding advanced directives.  Concepts specific to current full code status with specific consideration to his current illness and co-morbidities.  Issak confirms wishes for full aggressive interventions and full code status. He does express wishes for assistance in completing his advanced directives. He reports in the event he could not speak for himself his daughter, Benton Tooker would be his designated Media planner. He is aware I will place Spiritual  support consult for assistance with AD completion and prayer as he requested.   Values and goals of care important to patient and family were attempted to be elicited.    Questions and concerns were addressed. The family was encouraged to call with questions or concerns.  PMT will continue to support holistically as needed.   CODE STATUS: Full code  ADVANCE DIRECTIVES: Primary Decision Maker:NOK   SYMPTOM MANAGEMENT: per attending   Palliative Prophylaxis:   Frequent Pain Assessment and Palliative Wound Care  PSYCHO-SOCIAL/SPIRITUAL:  Support System: Family  Desire for further Chaplaincy support:YES   Additional Recommendations (Limitations, Scope, Preferences):  Full Scope Treatment    PAST MEDICAL HISTORY: Past Medical History:  Diagnosis Date  . 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 Chronic kidney disease    patient on dialysis tues-thurs-sat  . Cirrhosis of liver ([place])   . Drug addiction ([place])   . ETOH abuse   . Hepatitis   . Hypertension   . Mitral regurgitation   . Patent foramen ovale 10/23/2019  . Renal disorder renal failure  . S/P minimally-invasive mitral valve replacement with bioprosthetic valve 10/14/2020   31 mm Medtronic Mosaic stented porcine bioprosthetic tissue valve via right mini thoracotomy approach  . Sleep apnea   . Systolic and diastolic CHF, 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[place]) 04/19/2013    ALLERGIES:  has No Known Allergies.   MEDICATIONS:  Current Facility-Administered Medications  Medication Dose Route Frequency Provider Last Rate Last Admin  . (feeding supplement) PROSource Plus liquid 30 mL  30 mL Oral BID BM Eugenie Filler, MD   30 mL at 10/26/20 1428  . 0.9 %  sodium chloride infusion  100 mL Intravenous PRN Persons, Bevely Palmer, PA      . 0.9 %  sodium chloride infusion  100 mL Intravenous PRN Persons, Bevely Palmer, PA      . 0.9 %  sodium  chloride infusion  250 mL Intravenous PRN Persons, Bevely Palmer, PA      . acetaminophen (TYLENOL) tablet 650 mg  650 mg Oral Q6H PRN Persons, Bevely Palmer, [place]   650 mg at 10/25/20 2126  . aspirin EC tablet 325 mg  325 mg Oral Daily Persons, Bevely Palmer, [place]   325 mg at 10/28/20 6568  . calcium acetate (PHOSLO) capsule 667 mg  667 mg Oral TID WC Persons, Bevely Palmer, [place]   667 mg at 10/28/20 1275  . camphor-menthol (SARNA) lotion   Topical PRN Persons, Bevely Palmer, [place]      . Chlorhexidine Gluconate Cloth 2 % PADS 6 each  6 each Topical Q0600 Persons, Bevely Palmer, [place]   6 each at 10/27/20 0502  . cinacalcet (SENSIPAR) tablet 30 mg  30 mg Oral Q breakfast Persons, Bevely Palmer, PA   30 mg at 10/28/20 1700  . cloNIDine (CATAPRES - Dosed in mg/24 hr) patch 0.3 mg  0.3 mg Transdermal Weekly Persons, Bevely Palmer, PA   0.3 mg at 10/22/20 0904  . [START ON 10/29/2020] Darbepoetin Alfa (ARANESP) injection 60 mcg  60 mcg Intravenous Q Fri-HD Edrick Oh,  MD      . diphenhydrAMINE (BENADRYL) capsule 25 mg  25 mg Oral Q6H PRN Persons, Bevely Palmer, PA   25 mg at 10/16/20 0412  . doxercalciferol (HECTOROL) injection 3 mcg  3 mcg Intravenous Q M,W,F-HD Persons, Bevely Palmer, PA   3 mcg at 10/27/20 2015  . enoxaparin (LOVENOX) injection 30 mg  30 mg Subcutaneous Q48H Persons, Bevely Palmer, PA   30 mg at 10/27/20 0820  . feeding supplement (NEPRO CARB STEADY) liquid 237 mL  237 mL Oral TID BM Eugenie Filler, MD   Stopped at 10/28/20 0600  . gabapentin (NEURONTIN) capsule 200 mg  200 mg Oral QHS Persons, Bevely Palmer, PA   200 mg at 10/27/20 2313  . HYDROmorphone (DILAUDID) tablet 2 mg  2 mg Oral Q4H PRN Eugenie Filler, MD   2 mg at 10/28/20 0734  . influenza vac split quadrivalent PF (FLUARIX) injection 0.5 mL  0.5 mL Intramuscular Tomorrow-1000 Gherghe, Costin M, MD      . metoprolol tartrate (LOPRESSOR) tablet 50 mg  50 mg Oral BID Persons, Bevely Palmer, PA   50 mg at 10/28/20 1696  . multivitamin (RENA-VIT) tablet 1 tablet  1 tablet Oral  QHS Persons, Bevely Palmer, [place]   1 tablet at 10/27/20 2313  . ondansetron (ZOFRAN) tablet 4 mg  4 mg Oral Q6H PRN Persons, Bevely Palmer, PA       Or  . ondansetron [facility]) injection 4 mg  4 mg Intravenous Q6H PRN Persons, Bevely Palmer, [place]      . oxyCODONE (Oxy IR/ROXICODONE) immediate release tablet 5-10 mg  5-10 mg Oral Q3H PRN Eugenie Filler, MD   10 mg at 10/28/20 7893  . oxymetazoline (AFRIN) 0.05 % nasal spray 1 spray  1 spray Each Nare BID Eugenie Filler, MD   1 spray at 10/27/20 [phone]  . pantoprazole (PROTONIX) EC tablet 40 mg  40 mg Oral Daily Persons, Bevely Palmer, [place]   40 mg at 10/28/20 7510  . pentafluoroprop-tetrafluoroeth (GEBAUERS) aerosol 1 application  1 application Topical PRN Persons, Bevely Palmer, [place]      . senna-docusate (Senokot-S) tablet 1 tablet  1 tablet Oral BID Eugenie Filler, MD   1 tablet at 10/28/20 [phone]  . sodium chloride flush (NS) 0.9 % injection 3 mL  3 mL Intravenous Q12H Persons, Bevely Palmer, PA   3 mL at 10/27/20 2316  . sodium chloride flush (NS) 0.9 % injection 3 mL  3 mL Intravenous PRN Persons, Bevely Palmer, PA      . vancomycin (VANCOCIN) IVPB 750 mg/150 ml premix  750 mg Intravenous Q M,W,F-HD Alvira Philips,  150 mL/hr at 10/27/20 2116 750 mg at 10/27/20 2116   Facility-Administered Medications Ordered in Other Encounters  Medication Dose Route Frequency Provider Last Rate Last Admin  . regadenoson (LEXISCAN) injection SOLN 0.4 mg  0.4 mg Intravenous Once Cherlynn Kaiser A, MD      . technetium tetrofosmin (TC-MYOVIEW) injection 27.7 millicurie  82.4 millicurie Intravenous Once PRN Cherlynn Kaiser A, MD      . technetium tetrofosmin (TC-MYOVIEW) injection 23.5 millicurie  36.1 millicurie Intravenous Once PRN Elouise Munroe, MD        VITAL SIGNS: BP (!) 128/100 (BP Location: Right Arm)   Pulse 88   Temp 99.7 F (37.6 C) (Axillary)   Resp 14   Ht 6' 0.01" (1.829 m)   Wt 75.9 kg   SpO2 99%   BMI 22.68 kg/m  Filed Weights   10/27/20 1840  10/27/20 2218 10/28/20 0350  Weight: 79.9 kg 76.9 kg 75.9 kg    Estimated body mass index is 22.68 kg/m as calculated from the following:   Height as of this encounter: 6' 0.01" (1.829 m).   Weight as of this encounter: 75.9 kg.  LABS: CBC:    Component Value Date/Time   WBC 10.9 (H) 10/28/2020 0152   HGB 10.0 (L) 10/28/2020 0152   HCT 32.3 (L) 10/28/2020 0152   PLT 415 (H) 10/28/2020 0152   Comprehensive Metabolic Panel:    Component Value Date/Time   NA 132 (L) 10/28/2020 0152   K 3.9 10/28/2020 0152   BUN 22 (H) 10/28/2020 0152   CREATININE 4.75 (H) 10/28/2020 0152   ALBUMIN 2.2 (L) 10/28/2020 0152     Review of Systems  Musculoskeletal:       Leg pain   All other systems reviewed and are negative.   Physical Exam General: NAD Cardiovascular: regular rate and rhythm Pulmonary: diminished bilaterally  Abdomen: soft, nontender, + bowel sounds Skin: no rashes, warm and dry, right leg wound  Neurological: AA)x3, mood appropriate   Prognosis: Unable to determine  Discharge Planning:  To Be Determined  Recommendations: . Full Code-as confirmed by patient . Continue with current plan of care, full aggressive interventions . Patient is saddened by his current illness, however knows in order to have a chance of surviving amputation is necessary. He confirms wishes to proceed with surgery. Remains hopeful for ability to thrive and spend many more years with his family. Understands new way of life and rehabilitation journey and is mentally preparing.  . Request assistance with completing advanced directives and for continued spiritual support. (Spiritual Consult placed)  . PMT will continue to support and follow as needed. Please call team line with urgent needs.   Palliative Performance Scale: PPS 40-50%               Patient expressed understanding and was in agreement with this plan.   Thank you for allowing the Palliative Medicine Team to assist in the care of  this patient. Please utilize secure chat with additional questions, if there is no response within 30 minutes please call the above phone number.   Time In: 0850 Time Out: 0945 Time Total: 55 min.   Visit consisted of counseling and education dealing with the complex and emotionally intense issues of symptom management and palliative care in the setting of serious and potentially life-threatening illness.Greater than 50%  of this time was spent counseling and coordinating care related to the above assessment and plan.  Signed by:  Alda Lea, AGPCNP-BC Palliative Medicine Team  Phone: [phone] Pager: [phone] Amion: [place] Team providers are available by phone from 7am to 7pm daily and can be reached through the team cell phone.  Should this patient require assistance outside of these hours, please call the patient's attending physician.

## 2020-10-28 NOTE — Progress Notes (Signed)
[facility] Progress Note  Subjective: no new c/o  Vitals:   10/27/20 2228 10/27/20 2300 10/28/20 0350 10/28/20 0910  BP: 116/78 116/76 (!) 125/93 (!) 128/100  Pulse: 82 87 88 88  Resp:  '17 14 14  '$ Temp: 98.4 F (36.9 C) 98.4 F (36.9 C) 97.9 F (36.6 C) 99.7 F (37.6 C)  TempSrc: Oral Oral Oral Axillary  SpO2:  99% 97% 99%  Weight:   75.9 kg   Height:        Exam:  alert, nad   no jvd  Chest cta bilat  Cor reg no RG  Abd soft ntnd no ascites   Ext no LE edema, R lower leg wrapped   Alert, NF, ox3   LUE AVG +bruit     OP HD: [facility]WF [facility]    4h 350/500 new edw here 73- 75kg (was82.5kg)2/2.5 bath LUA AVGHep 5000 -Aranesp 18mgIVq 2wks - last 09/27/20 (Verified order) -Hectorol377m IV qHD    Assessment/ Plan: 1. SP min invasive MVR(bioprosthetic)replacement - on 3/10. Doing well from cardiac standpoint 2. Anterior shin osteomyelitis: status post I&D per orthopedics. Pathology showing staph aureus in the tibia and ortho recommending AKA.  ID following.  3. HTN/volume - continues on clonidine patch and metoprolol 25 bid. New lower dry wt here 73- 75kg.  4. 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 Underwent tooth extraction3/8/22.PerID to get 6 wks IV vanc w/ end date of 11/25/20. 5. ESRD - usual HDMWF.HD Friday 6. Anemiaof CKD-HGB 9- 10 range. S/P 2 units of PRBCs 3/10.Darbe 100ug given here on 3/16, then lowered to 60ug weekly, next dose due 3/25. No IV Fe due to osteomyelitis.  7. Secondary Hyperparathyroidism -Calcium and phosphorus controlled. Continue sensipar, binders, and hectorol 8. Hx Hep C /liver cirrhosis.       Rob Karan Ramnauth 10/28/2020, 11:53 AM   Recent Labs  Lab 10/27/20 0122 10/28/20 0152  K 4.0 3.9  BUN 50* 22*  CREATININE 7.90* 4.75*  CALCIUM 8.5* 8.5*  PHOS 5.4* 3.2  HGB 8.9* 10.0*   Inpatient medications: . (feeding supplement) PROSource Plus   30 mL Oral BID BM  . aspirin EC  325 mg Oral Daily  . calcium acetate  667 mg Oral TID WC  . Chlorhexidine Gluconate Cloth  6 each Topical Q0600  . cinacalcet  30 mg Oral Q breakfast  . cloNIDine  0.3 mg Transdermal Weekly  . [START ON 10/29/2020] darbepoetin (ARANESP) injection - DIALYSIS  60 mcg Intravenous Q Fri-HD  . doxercalciferol  3 mcg Intravenous Q M,W,F-HD  . enoxaparin (LOVENOX) injection  30 mg Subcutaneous Q48H  . feeding supplement (NEPRO CARB STEADY)  237 mL Oral TID BM  . gabapentin  200 mg Oral QHS  . influenza vac split quadrivalent PF  0.5 mL Intramuscular Tomorrow-1000  . metoprolol tartrate  50 mg Oral BID  . multivitamin  1 tablet Oral QHS  . oxymetazoline  1 spray Each Nare BID  . pantoprazole  40 mg Oral Daily  . senna-docusate  1 tablet Oral BID  . sodium chloride flush  3 mL Intravenous Q12H   . sodium chloride    . sodium chloride    . sodium chloride    . vancomycin 750 mg (10/27/20 2116)   sodium chloride, sodium chloride, sodium chloride, acetaminophen, camphor-menthol, diphenhydrAMINE, HYDROmorphone, ondansetron **OR** ondansetron (ZOFRAN) IV, oxyCODONE, pentafluoroprop-tetrafluoroeth, sodium chloride flush

## 2020-10-28 NOTE — Progress Notes (Signed)
Subjective:  Patient had not been able to get in touch with PCP   Antibiotics:  Anti-infectives (From admission, onward)   Start     Dose/Rate Route Frequency Ordered Stop   10/27/20 2015  vancomycin (VANCOCIN) IVPB 750 mg/150 ml premix        750 mg 150 mL/hr over 60 Minutes Intravenous Every M-W-F (Hemodialysis) 10/27/20 2005     10/25/20 0922  vancomycin (VANCOCIN) 500-5 MG/100ML-% IVPB       Note to Pharmacy: Kalman Shan   : cabinet override      10/25/20 0922 10/25/20 0958   10/21/20 0600  ceFAZolin (ANCEF) IVPB 2g/100 mL premix        2 g 200 mL/hr over 30 Minutes Intravenous On call to O.R. 10/20/20 1349 10/21/20 0650   10/20/20 2243  vancomycin (VANCOCIN) 500-5 MG/100ML-% IVPB       Note to Pharmacy: Wallace Cullens   : cabinet override      10/20/20 2243 10/20/20 2243   10/20/20 1437  ceFAZolin (ANCEF) 2-4 GM/100ML-% IVPB       Note to Pharmacy: Ladoris Gene   : cabinet override      10/20/20 1437 10/20/20 1612   10/20/20 1200  vancomycin (VANCOREADY) IVPB 500 mg/100 mL  Status:  Discontinued        500 mg 100 mL/hr over 60 Minutes Intravenous Every M-W-F (Hemodialysis) 10/20/20 0903 10/27/20 2005   10/18/20 1124  vancomycin variable dose per unstable renal function (pharmacist dosing)  Status:  Discontinued         Does not apply See admin instructions 10/18/20 1124 10/20/20 0903   10/16/20 1600  vancomycin (VANCOREADY) IVPB 500 mg/100 mL  Status:  Discontinued        500 mg 100 mL/hr over 60 Minutes Intravenous Every Sat (Hemodialysis) 10/16/20 1306 10/16/20 1312   10/15/20 1500  vancomycin (VANCOCIN) IVPB 500 mg/100 ml premix  Status:  Discontinued        500 mg 100 mL/hr over 60 Minutes Intravenous Every M-W-F (Hemodialysis) 10/15/20 1357 10/17/20 0942   10/14/20 2130  vancomycin (VANCOCIN) IVPB 1000 mg/200 mL premix  Status:  Discontinued        1,000 mg 200 mL/hr over 60 Minutes Intravenous  Once 10/14/20 1403 10/14/20 1547   10/14/20 2130   vancomycin (VANCOREADY) IVPB 500 mg/100 mL        500 mg 100 mL/hr over 60 Minutes Intravenous  Once 10/14/20 1547 10/14/20 2222   10/14/20 1730  cefUROXime (ZINACEF) 1.5 g in sodium chloride 0.9 % 100 mL IVPB        1.5 g 200 mL/hr over 30 Minutes Intravenous Every 24 hr x 2 10/14/20 1403 10/15/20 1718   10/14/20 1128  vancomycin (VANCOCIN) 1,000 mg in sodium chloride 0.9 % 1,000 mL irrigation  Status:  Discontinued          As needed 10/14/20 1128 10/14/20 1401   10/14/20 0400  vancomycin (VANCOREADY) IVPB 1250 mg/250 mL        1,250 mg 166.7 mL/hr over 90 Minutes Intravenous To Surgery 10/13/20 1039 10/14/20 0910   10/14/20 0400  cefUROXime (ZINACEF) 1.5 g in sodium chloride 0.9 % 100 mL IVPB        1.5 g 200 mL/hr over 30 Minutes Intravenous To Surgery 10/13/20 1039 10/14/20 0835   10/14/20 0400  cefUROXime (ZINACEF) 750 mg in sodium chloride 0.9 % 100 mL IVPB  750 mg 200 mL/hr over 30 Minutes Intravenous To Surgery 10/13/20 1039 10/14/20 1250   10/14/20 0400  vancomycin (VANCOCIN) 1,000 mg in sodium chloride 0.9 % 1,000 mL irrigation  Status:  Discontinued         Irrigation To Surgery 10/13/20 1039 10/14/20 1403   10/13/20 1657  vancomycin (VANCOCIN) 500-5 MG/100ML-% IVPB       Note to Pharmacy: Judieth Keens  : cabinet override      10/13/20 1657 10/13/20 1717   10/12/20 0600  ceFAZolin (ANCEF) IVPB 2g/100 mL premix        2 g 200 mL/hr over 30 Minutes Intravenous On call to O.R. 10/11/20 1841 10/12/20 1315   10/11/20 1415  vancomycin (VANCOCIN) IVPB 500 mg/100 ml premix  Status:  Discontinued        500 mg 100 mL/hr over 60 Minutes Intravenous Every M-W-F (Hemodialysis) 10/11/20 1413 10/14/20 1403   10/11/20 1413  vancomycin (VANCOCIN) 500-5 MG/100ML-% IVPB       Note to Pharmacy: Kalman Shan   : cabinet override      10/11/20 1413 10/12/20 0229   10/11/20 1353  Vancomycin (VANCOCIN) 750-5 MG/150ML-% IVPB  Status:  Discontinued       Note to Pharmacy: Kalman Shan    : cabinet override      10/11/20 1353 10/11/20 1417   10/08/20 1200  vancomycin (VANCOCIN) IVPB 750 mg/150 ml premix  Status:  Discontinued        750 mg 150 mL/hr over 60 Minutes Intravenous Every M-W-F (Hemodialysis) 10/05/20 1404 10/11/20 1413   10/06/20 1619  vancomycin (VANCOCIN) 500-5 MG/100ML-% IVPB       Note to Pharmacy: Judieth Keens  : cabinet override      10/06/20 1619 10/06/20 1736   10/06/20 1200  vancomycin (VANCOCIN) IVPB 750 mg/150 ml premix  Status:  Discontinued        750 mg 150 mL/hr over 60 Minutes Intravenous Every M-W-F (Hemodialysis) 10/04/20 0905 10/05/20 1404   10/06/20 1200  vancomycin (VANCOCIN) IVPB 500 mg/100 ml premix        500 mg 100 mL/hr over 60 Minutes Intravenous Every Wed (Hemodialysis) 10/05/20 1404 10/06/20 2000   10/01/20 2000  DAPTOmycin (CUBICIN) 840 mg in sodium chloride 0.9 % IVPB  Status:  Discontinued        840 mg 233.6 mL/hr over 30 Minutes Intravenous Every 48 hours 10/01/20 0922 10/01/20 1056   10/01/20 1200  vancomycin (VANCOCIN) IVPB 1000 mg/200 mL premix  Status:  Discontinued        1,000 mg 200 mL/hr over 60 Minutes Intravenous Every M-W-F (Hemodialysis) 10/01/20 1056 10/04/20 0845   09/29/20 1200  vancomycin (VANCOCIN) IVPB 1000 mg/200 mL premix  Status:  Discontinued        1,000 mg 200 mL/hr over 60 Minutes Intravenous Every M-W-F (Hemodialysis) 09/27/20 1443 10/01/20 0922   09/29/20 1200  ceFEPIme (MAXIPIME) 2 g in sodium chloride 0.9 % 100 mL IVPB  Status:  Discontinued        2 g 200 mL/hr over 30 Minutes Intravenous Every M-W-F (Hemodialysis) 09/27/20 1443 09/28/20 1054   09/28/20 0900  metroNIDAZOLE (FLAGYL) IVPB 500 mg  Status:  Discontinued        500 mg 100 mL/hr over 60 Minutes Intravenous Every 12 hours 09/27/20 1808 09/28/20 1054   09/28/20 0900  ceFEPIme (MAXIPIME) 1 g in sodium chloride 0.9 % 100 mL IVPB  Status:  Discontinued        1  g 200 mL/hr over 30 Minutes Intravenous Every 12 hours 09/27/20 1808  09/27/20 1812   09/27/20 1445  vancomycin (VANCOREADY) IVPB 2000 mg/400 mL        2,000 mg 200 mL/hr over 120 Minutes Intravenous  Once 09/27/20 1433 09/27/20 2330   09/27/20 1430  ceFEPIme (MAXIPIME) 2 g in sodium chloride 0.9 % 100 mL IVPB        2 g 200 mL/hr over 30 Minutes Intravenous  Once 09/27/20 1418 09/27/20 1541   09/27/20 1430  metroNIDAZOLE (FLAGYL) IVPB 500 mg        500 mg 100 mL/hr over 60 Minutes Intravenous  Once 09/27/20 1418 09/27/20 2214   09/27/20 1430  vancomycin (VANCOREADY) IVPB 1750 mg/350 mL  Status:  Discontinued        1,750 mg 175 mL/hr over 120 Minutes Intravenous  Once 09/27/20 1418 09/27/20 1433      Medications: Scheduled Meds: . (feeding supplement) PROSource Plus  30 mL Oral BID BM  . aspirin EC  325 mg Oral Daily  . calcium acetate  667 mg Oral TID WC  . Chlorhexidine Gluconate Cloth  6 each Topical Q0600  . cinacalcet  30 mg Oral Q breakfast  . cloNIDine  0.3 mg Transdermal Weekly  . [START ON 10/29/2020] darbepoetin (ARANESP) injection - DIALYSIS  60 mcg Intravenous Q Fri-HD  . doxercalciferol  3 mcg Intravenous Q M,W,F-HD  . enoxaparin (LOVENOX) injection  30 mg Subcutaneous Q48H  . feeding supplement (NEPRO CARB STEADY)  237 mL Oral TID BM  . gabapentin  200 mg Oral QHS  . influenza vac split quadrivalent PF  0.5 mL Intramuscular Tomorrow-1000  . metoprolol tartrate  50 mg Oral BID  . multivitamin  1 tablet Oral QHS  . oxymetazoline  1 spray Each Nare BID  . pantoprazole  40 mg Oral Daily  . senna-docusate  1 tablet Oral BID  . sodium chloride flush  3 mL Intravenous Q12H   Continuous Infusions: . sodium chloride    . sodium chloride    . sodium chloride    . vancomycin 750 mg (10/27/20 2116)   PRN Meds:.sodium chloride, sodium chloride, sodium chloride, acetaminophen, camphor-menthol, diphenhydrAMINE, HYDROmorphone, ondansetron **OR** ondansetron (ZOFRAN) IV, oxyCODONE, pentafluoroprop-tetrafluoroeth, sodium chloride  flush    Objective: Weight change: 3.061 kg  Intake/Output Summary (Last 24 hours) at 10/28/2020 1810 Last data filed at 10/28/2020 0600 Gross per 24 hour  Intake 363 ml  Output 3000 ml  Net -2637 ml   Blood pressure (!) 137/101, pulse 83, temperature 98.1 F (36.7 C), temperature source Oral, resp. rate 10, height 6' 0.01" (1.829 m), weight 75.9 kg, SpO2 100 %. Temp:  [97 F (36.1 C)-99.7 F (37.6 C)] 98.1 F (36.7 C) (03/24 1702) Pulse Rate:  [77-88] 83 (03/24 1302) Resp:  [10-23] 10 (03/24 1702) BP: (102-145)/(56-101) 137/101 (03/24 1702) SpO2:  [92 %-100 %] 100 % (03/24 1702) Weight:  [75.9 kg-79.9 kg] 75.9 kg (03/24 0350)  Physical Exam: Physical Exam Constitutional:      Appearance: He is well-developed.  HENT:     Head: Normocephalic and atraumatic.  Eyes:     Conjunctiva/sclera: Conjunctivae normal.  Cardiovascular:     Rate and Rhythm: Normal rate and regular rhythm.  Pulmonary:     Effort: Pulmonary effort is normal. No respiratory distress.     Breath sounds: No wheezing.  Abdominal:     General: There is no distension.     Palpations: Abdomen is soft.  Musculoskeletal:  General: Normal range of motion.     Cervical back: Normal range of motion and neck supple.  Skin:    General: Skin is warm and dry.     Findings: No erythema or rash.  Neurological:     Mental Status: He is alert and oriented to person, place, and time.  Psychiatric:        Attention and Perception: Attention normal.        Mood and Affect: Mood is depressed.        Behavior: Behavior normal.        Thought Content: Thought content normal.        Judgment: Judgment normal.     Right  leg wrapped  CBC:    BMET Recent Labs    10/27/20 0122 10/28/20 0152  NA 129* 132*  K 4.0 3.9  CL 90* 95*  CO2 26 26  GLUCOSE 108* 115*  BUN 50* 22*  CREATININE 7.90* 4.75*  CALCIUM 8.5* 8.5*     Liver Panel  Recent Labs    10/27/20 0122 10/28/20 0152  ALBUMIN 2.1* 2.2*        Sedimentation Rate No results for input(s): ESRSEDRATE in the last 72 hours. C-Reactive Protein No results for input(s): CRP in the last 72 hours.  Micro Results: Recent Results (from the past 720 hour(s))  Culture, blood (routine x 2)     Status: None   Collection Time: 09/30/20  6:04 AM   Specimen: BLOOD RIGHT FOREARM  Result Value Ref Range Status   Specimen Description BLOOD RIGHT FOREARM  Final   Special Requests   Final    BOTTLES DRAWN AEROBIC AND ANAEROBIC Blood Culture adequate volume   Culture   Final    NO GROWTH 5 DAYS Performed at [facility], [address] [place], [place] [address]    Report Status 10/05/2020 FINAL  Final  Culture, blood (routine x 2)     Status: Abnormal   Collection Time: 09/30/20  6:08 AM   Specimen: BLOOD RIGHT HAND  Result Value Ref Range Status   Specimen Description BLOOD RIGHT HAND  Final   Special Requests   Final    BOTTLES DRAWN AEROBIC AND ANAEROBIC Blood Culture results may not be optimal due to an inadequate volume of blood received in culture bottles   Culture  Setup Time   Final    GRAM POSITIVE COCCI IN CLUSTERS ANAEROBIC BOTTLE ONLY CRITICAL VALUE NOTED.  VALUE IS CONSISTENT WITH PREVIOUSLY REPORTED AND CALLED VALUE.    Culture (A)  Final    STAPHYLOCOCCUS AUREUS SUSCEPTIBILITIES PERFORMED ON PREVIOUS CULTURE WITHIN THE LAST 5 DAYS. Performed at [facility], [place] [address], [place], [place] [address]    Report Status 10/03/2020 FINAL  Final  Culture, blood (routine x 2)     Status: None   Collection Time: 10/03/20  2:42 AM   Specimen: BLOOD  Result Value Ref Range Status   Specimen Description BLOOD LEFT ARM  Final   Special Requests   Final    BOTTLES DRAWN AEROBIC AND ANAEROBIC Blood Culture results may not be optimal due to an inadequate volume of blood received in culture bottles   Culture   Final    NO GROWTH 5 DAYS Performed at [facility], [place] [address] [place], [place]  [address]    Report Status 10/08/2020 FINAL  Final  Culture, blood (routine x 2)     Status: None   Collection  Time: 10/03/20  2:42 AM   Specimen: BLOOD  Result Value Ref Range Status   Specimen Description BLOOD LEFT HAND  Final   Special Requests   Final    BOTTLES DRAWN AEROBIC ONLY Blood Culture results may not be optimal due to an inadequate volume of blood received in culture bottles   Culture   Final    NO GROWTH 5 DAYS Performed at [facility], [place] [address] [place], [place] [address]    Report Status 10/08/2020 FINAL  Final  Surgical pcr screen     Status: Abnormal   Collection Time: 10/11/20  6:44 PM   Specimen: Nasal Mucosa; Nasal Swab  Result Value Ref Range Status   MRSA, PCR POSITIVE (A) NEGATIVE Final    Comment: RESULT CALLED TO, READ BACK BY AND VERIFIED WITH: F COMBRES RN 10/11/20 2339 JDW    Staphylococcus aureus POSITIVE (A) NEGATIVE Final    Comment: (NOTE) The Xpert SA Assay (FDA approved for NASAL specimens in patients 26 years of age and older), is one component of a comprehensive surveillance program. It is not intended to diagnose infection nor to guide or monitor treatment. Performed at [facility], [place] [address], [place], [place] [address]   Culture, blood (routine x 2)     Status: None   Collection Time: 10/13/20 10:43 PM   Specimen: BLOOD  Result Value Ref Range Status   Specimen Description BLOOD RIGHT THUMB  Final   Special Requests   Final    BOTTLES DRAWN AEROBIC AND ANAEROBIC Blood Culture adequate volume   Culture   Final    NO GROWTH 5 DAYS Performed at [facility], [place] [address] [place], [place] [address]    Report Status 10/19/2020 FINAL  Final  Culture, blood (routine x 2)     Status: None   Collection Time: 10/13/20 10:43 PM   Specimen: BLOOD RIGHT HAND  Result Value Ref Range Status   Specimen Description BLOOD RIGHT HAND  Final   Special Requests   Final    BOTTLES DRAWN AEROBIC AND ANAEROBIC Blood  Culture adequate volume   Culture   Final    NO GROWTH 5 DAYS Performed at [facility], [place] [address], [place], [place] [address]    Report Status 10/19/2020 FINAL  Final  Aerobic/Anaerobic Culture w Gram Stain (surgical/deep wound)     Status: None   Collection Time: 10/14/20 10:38 AM   Specimen: Soft Tissue, Other  Result Value Ref Range Status   Specimen Description TISSUE MITRAL VALVE VEGETATION SPEC A  Final   Special Requests MITRAL VALVE VEGETATION  Final   Gram Stain   Final    WBC PRESENT,BOTH PMN AND MONONUCLEAR NO ORGANISMS SEEN    Culture   Final    No growth aerobically or anaerobically. Performed at [facility], [place] [address], [place], [place] [address]    Report Status 10/19/2020 FINAL  Final  Aerobic/Anaerobic Culture w Gram Stain (surgical/deep wound)     Status: None   Collection Time: 10/20/20  4:12 PM   Specimen: Soft Tissue, Other  Result Value Ref Range Status   Specimen Description TISSUE  Final   Special Requests RIGHT LEG ABSC SPEC A  Final   Gram Stain   Final    RARE WBC PRESENT,BOTH PMN AND MONONUCLEAR NO ORGANISMS SEEN    Culture   Final    FEW METHICILLIN RESISTANT STAPHYLOCOCCUS AUREUS CRITICAL RESULT CALLED  TO, READ BACK BY AND VERIFIED WITH: RN N.[email or web address] AT [id number] ON 10/22/2020 BY T.SAAD NO ANAEROBES ISOLATED Performed at [facility], [place] [address] [place], [place] [address]    Report Status 10/26/2020 FINAL  Final   Organism ID, Bacteria METHICILLIN RESISTANT STAPHYLOCOCCUS AUREUS  Final      Susceptibility   Methicillin resistant staphylococcus aureus - MIC*    CIPROFLOXACIN >=8 RESISTANT Resistant     ERYTHROMYCIN >=8 RESISTANT Resistant     GENTAMICIN <=0.5 SENSITIVE Sensitive     OXACILLIN >=4 RESISTANT Resistant     TETRACYCLINE <=1 SENSITIVE Sensitive     VANCOMYCIN 1 SENSITIVE Sensitive     TRIMETH/SULFA <=10 SENSITIVE Sensitive     CLINDAMYCIN <=0.25 SENSITIVE Sensitive     RIFAMPIN <=0.5 SENSITIVE  Sensitive     Inducible Clindamycin NEGATIVE Sensitive     * FEW METHICILLIN RESISTANT STAPHYLOCOCCUS AUREUS  Fungus Stain     Status: None   Collection Time: 10/20/20  4:12 PM   Specimen: Soft Tissue, Other  Result Value Ref Range Status   FUNGUS STAIN Final report  Final    Comment: (NOTE) Performed At: [facility] [address] [place], [place] [id number] Rush Farmer MD [id number]    Fungal Source TISSUE  Final    Comment: RIGHT LEG ABSC SPEC A Performed at [facility], [place] [address] [place], [place] [address]   Fungal Stain reflex     Status: None   Collection Time: 10/20/20  4:12 PM  Result Value Ref Range Status   Fungal stain result 1 Comment  Final    Comment: (NOTE) KOH/Calcofluor preparation:  no fungus observed. Performed At: [facility] [place], [place] [id number] Rush Farmer MD [id number]     Studies/Results: No results found.    Assessment/Plan:  INTERVAL HISTORY:  Since I saw him he agreed to undergo curative BKA for his osteomyelitis  Principal Problem:   Bacterial endocarditis Active Problems:   Chronic combined systolic and diastolic congestive heart failure (HCC)   HTN (hypertension)   Anemia in chronic kidney disease   Hepatitis C   ESRD on dialysis ([place])   End stage renal disease (HCC)   Chest pain   Sepsis ([place])   Pneumonia due to infectious agent   ADPKD (autosomal dominant polycystic kidney disease)   Sepsis due to pneumonia (HCC)   Lactic acidosis   Cerebral embolism with cerebral infarction   Bacteremia due to Staphylococcus aureus   Mitral regurgitation   Uncontrolled hypertension   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  He had extraction of multiple teeth by oral surgery.  On 10 March she went to the OR and underwent mitral valve replacement and closure of the PFO.  In the interim he was found to have large " bumps" on his right leg which he had not mentioned until after surgery.  He  turned out to have large complicated abscesses with osteomyelitis involving his tibia with a 7 x 3 cm area of bone that was excised.  The patient needs an above-the-knee amputation but is still not consented to going forward with surgery.  He has been told by multiple providers multiple times that he needs an above-the-knee amputation to avoid seeding his new valve his graft and potentially dying from staph aureus bacteremia and sepsis.  I again reiterated that if he wanted to live a longer life he would need to undergo AKA and that I would make that choice If it were me.  Fortunately later today he agreed to surgery.  If he undergoes AKA then he only needs to keep his originally planned antibiotic stop date  I spent greater than 35 minutes with the patient including greater than 50% of time in face to face counsel of the patient re need for AKA  and in coordination of his  care.    LOS: 31 days   Patrick Anthony 10/28/2020, 6:10 PM

## 2020-10-29 ENCOUNTER — Encounter (HOSPITAL_COMMUNITY)
Admission: EM | Disposition: A | Discharge status: Skilled Nursing Facility | Payer: Self-pay | Source: Home / Self Care | Attending: Internal Medicine

## 2020-10-29 ENCOUNTER — Inpatient Hospital Stay (HOSPITAL_COMMUNITY): Payer: Medicaid Other | Admitting: Certified Registered Nurse Anesthetist

## 2020-10-29 DIAGNOSIS — I340 Nonrheumatic mitral (valve) insufficiency: Secondary | ICD-10-CM | POA: Diagnosis not present

## 2020-10-29 DIAGNOSIS — Z20822 Contact with and (suspected) exposure to covid-19: Secondary | ICD-10-CM | POA: Diagnosis not present

## 2020-10-29 DIAGNOSIS — L02415 Cutaneous abscess of right lower limb: Secondary | ICD-10-CM | POA: Diagnosis not present

## 2020-10-29 DIAGNOSIS — M86662 Other chronic osteomyelitis, left tibia and fibula: Secondary | ICD-10-CM | POA: Diagnosis not present

## 2020-10-29 DIAGNOSIS — Q612 Polycystic kidney, adult type: Secondary | ICD-10-CM | POA: Diagnosis not present

## 2020-10-29 DIAGNOSIS — D631 Anemia in chronic kidney disease: Secondary | ICD-10-CM | POA: Diagnosis not present

## 2020-10-29 DIAGNOSIS — G928 Other toxic encephalopathy: Secondary | ICD-10-CM | POA: Diagnosis not present

## 2020-10-29 DIAGNOSIS — I639 Cerebral infarction, unspecified: Secondary | ICD-10-CM | POA: Diagnosis not present

## 2020-10-29 DIAGNOSIS — M86462 Chronic osteomyelitis with draining sinus, left tibia and fibula: Secondary | ICD-10-CM | POA: Diagnosis not present

## 2020-10-29 DIAGNOSIS — B182 Chronic viral hepatitis C: Secondary | ICD-10-CM | POA: Diagnosis not present

## 2020-10-29 DIAGNOSIS — R29718 NIHSS score 18: Secondary | ICD-10-CM | POA: Diagnosis not present

## 2020-10-29 DIAGNOSIS — N186 End stage renal disease: Secondary | ICD-10-CM | POA: Diagnosis not present

## 2020-10-29 DIAGNOSIS — L0291 Cutaneous abscess, unspecified: Secondary | ICD-10-CM | POA: Diagnosis not present

## 2020-10-29 DIAGNOSIS — J189 Pneumonia, unspecified organism: Secondary | ICD-10-CM | POA: Diagnosis not present

## 2020-10-29 DIAGNOSIS — K036 Deposits [accretions] on teeth: Secondary | ICD-10-CM | POA: Diagnosis not present

## 2020-10-29 DIAGNOSIS — N2581 Secondary hyperparathyroidism of renal origin: Secondary | ICD-10-CM | POA: Diagnosis not present

## 2020-10-29 DIAGNOSIS — I2690 Septic pulmonary embolism without acute cor pulmonale: Secondary | ICD-10-CM | POA: Diagnosis not present

## 2020-10-29 DIAGNOSIS — I6310 Cerebral infarction due to embolism of unspecified precerebral artery: Secondary | ICD-10-CM | POA: Diagnosis not present

## 2020-10-29 DIAGNOSIS — I5032 Chronic diastolic (congestive) heart failure: Secondary | ICD-10-CM | POA: Diagnosis not present

## 2020-10-29 DIAGNOSIS — R531 Weakness: Secondary | ICD-10-CM | POA: Diagnosis not present

## 2020-10-29 DIAGNOSIS — J984 Other disorders of lung: Secondary | ICD-10-CM | POA: Diagnosis not present

## 2020-10-29 DIAGNOSIS — M86661 Other chronic osteomyelitis, right tibia and fibula: Secondary | ICD-10-CM | POA: Diagnosis not present

## 2020-10-29 DIAGNOSIS — A419 Sepsis, unspecified organism: Secondary | ICD-10-CM | POA: Diagnosis not present

## 2020-10-29 DIAGNOSIS — I6340 Cerebral infarction due to embolism of unspecified cerebral artery: Secondary | ICD-10-CM | POA: Diagnosis not present

## 2020-10-29 DIAGNOSIS — R0602 Shortness of breath: Secondary | ICD-10-CM | POA: Diagnosis not present

## 2020-10-29 DIAGNOSIS — I132 Hypertensive heart and chronic kidney disease with heart failure and with stage 5 chronic kidney disease, or end stage renal disease: Secondary | ICD-10-CM | POA: Diagnosis not present

## 2020-10-29 DIAGNOSIS — M86461 Chronic osteomyelitis with draining sinus, right tibia and fibula: Secondary | ICD-10-CM | POA: Diagnosis not present

## 2020-10-29 DIAGNOSIS — A4102 Sepsis due to Methicillin resistant Staphylococcus aureus: Secondary | ICD-10-CM | POA: Diagnosis not present

## 2020-10-29 DIAGNOSIS — R29716 NIHSS score 16: Secondary | ICD-10-CM | POA: Diagnosis not present

## 2020-10-29 DIAGNOSIS — R29711 NIHSS score 11: Secondary | ICD-10-CM | POA: Diagnosis not present

## 2020-10-29 DIAGNOSIS — E872 Acidosis: Secondary | ICD-10-CM | POA: Diagnosis not present

## 2020-10-29 DIAGNOSIS — Z992 Dependence on renal dialysis: Secondary | ICD-10-CM | POA: Diagnosis not present

## 2020-10-29 DIAGNOSIS — I330 Acute and subacute infective endocarditis: Secondary | ICD-10-CM | POA: Diagnosis not present

## 2020-10-29 DIAGNOSIS — I5042 Chronic combined systolic (congestive) and diastolic (congestive) heart failure: Secondary | ICD-10-CM | POA: Diagnosis not present

## 2020-10-29 DIAGNOSIS — R29712 NIHSS score 12: Secondary | ICD-10-CM | POA: Diagnosis not present

## 2020-10-29 HISTORY — PX: AMPUTATION: SHX166

## 2020-10-29 LAB — RENAL FUNCTION PANEL
Albumin: 2.2 g/dL — ABNORMAL LOW (ref 3.5–5.0)
Albumin: 2.1 g/dL — ABNORMAL LOW (ref 3.5–5.0)
Anion gap: 11 (ref 5–15)
Anion gap: 12 (ref 5–15)
BUN: 22 mg/dL — ABNORMAL HIGH (ref 6–20)
BUN: 38 mg/dL — ABNORMAL HIGH (ref 6–20)
CO2: 26 mmol/L (ref 22–32)
CO2: 25 mmol/L (ref 22–32)
Calcium: 8.5 mg/dL — ABNORMAL LOW (ref 8.9–10.3)
Calcium: 8.6 mg/dL — ABNORMAL LOW (ref 8.9–10.3)
Chloride: 95 mmol/L — ABNORMAL LOW (ref 98–111)
Chloride: 93 mmol/L — ABNORMAL LOW (ref 98–111)
Creatinine, Ser: 4.75 mg/dL — ABNORMAL HIGH (ref 0.61–1.24)
Creatinine, Ser: 7.11 mg/dL — ABNORMAL HIGH (ref 0.61–1.24)
GFR, Estimated: 14 mL/min — ABNORMAL LOW (ref >60)
GFR, Estimated: 9 mL/min — ABNORMAL LOW (ref >60)
Glucose, Bld: 115 mg/dL — ABNORMAL HIGH (ref 70–99)
Glucose, Bld: 111 mg/dL — ABNORMAL HIGH (ref 70–99)
Phosphorus: 3.2 mg/dL (ref 2.5–4.6)
Phosphorus: 4.7 mg/dL — ABNORMAL HIGH (ref 2.5–4.6)
Potassium: 3.9 mmol/L (ref 3.5–5.1)
Potassium: 4.1 mmol/L (ref 3.5–5.1)
Sodium: 132 mmol/L — ABNORMAL LOW (ref 135–145)
Sodium: 130 mmol/L — ABNORMAL LOW (ref 135–145)

## 2020-10-29 LAB — CBC WITH DIFFERENTIAL/PLATELET
Abs Immature Granulocytes: 0.07 10*3/uL (ref 0.00–0.07)
Basophils Absolute: 0 10*3/uL (ref 0.0–0.1)
Basophils Relative: 0 %
Eosinophils Absolute: 0.2 10*3/uL (ref 0.0–0.5)
Eosinophils Relative: 2 %
HCT: 27.7 % — ABNORMAL LOW (ref 39.0–52.0)
Hemoglobin: 8.5 g/dL — ABNORMAL LOW (ref 13.0–17.0)
Immature Granulocytes: 1 %
Lymphocytes Relative: 14 %
Lymphs Abs: 1.4 10*3/uL (ref 0.7–4.0)
MCH: 28 pg (ref 26.0–34.0)
MCHC: 30.7 g/dL (ref 30.0–36.0)
MCV: 91.1 fL (ref 80.0–100.0)
Monocytes Absolute: 1.6 10*3/uL — ABNORMAL HIGH (ref 0.1–1.0)
Monocytes Relative: 16 %
Neutro Abs: 6.8 10*3/uL (ref 1.7–7.7)
Neutrophils Relative %: 67 %
Platelets: 389 10*3/uL (ref 150–400)
RBC: 3.04 MIL/uL — ABNORMAL LOW (ref 4.22–5.81)
RDW: 17.4 % — ABNORMAL HIGH (ref 11.5–15.5)
WBC: 10 10*3/uL (ref 4.0–10.5)
nRBC: 0 % (ref 0.0–0.2)

## 2020-10-29 SURGERY — AMPUTATION, ABOVE KNEE
Anesthesia: General | Site: Knee | Laterality: Right

## 2020-10-29 MED ORDER — DEXAMETHASONE SODIUM PHOSPHATE 10 MG/ML IJ SOLN
INTRAMUSCULAR | Status: DC | PRN
  Administered 2020-10-29: 10 mg via INTRAVENOUS

## 2020-10-29 MED ORDER — SODIUM CHLORIDE 0.9 % IR SOLN
Status: DC | PRN
  Administered 2020-10-29: 1000 mL

## 2020-10-29 MED ORDER — PROPOFOL 10 MG/ML IV BOLUS
INTRAVENOUS | Status: AC
  Filled 2020-10-29: qty 20

## 2020-10-29 MED ORDER — CHLORHEXIDINE GLUCONATE 0.12 % MT SOLN
OROMUCOSAL | Status: AC
  Administered 2020-10-29: 15 mL via OROMUCOSAL
  Filled 2020-10-29: qty 15

## 2020-10-29 MED ORDER — LIDOCAINE 2% (20 MG/ML) 5 ML SYRINGE
INTRAMUSCULAR | Status: DC | PRN
  Administered 2020-10-29: 50 mg via INTRAVENOUS

## 2020-10-29 MED ORDER — ONDANSETRON HCL 4 MG/2ML IJ SOLN
INTRAMUSCULAR | Status: DC | PRN
  Administered 2020-10-29: 4 mg via INTRAVENOUS

## 2020-10-29 MED ORDER — LACTATED RINGERS IV SOLN: INTRAVENOUS | Status: DC

## 2020-10-29 MED ORDER — POLYETHYLENE GLYCOL 3350 17 G PO PACK: 17.0000 g | PACK | Freq: Every day | ORAL | Status: DC | PRN

## 2020-10-29 MED ORDER — FENTANYL CITRATE (PF) 250 MCG/5ML IJ SOLN
INTRAMUSCULAR | Status: AC
  Filled 2020-10-29: qty 5

## 2020-10-29 MED ORDER — HYDROMORPHONE HCL 1 MG/ML IJ SOLN
0.5000 mg | INTRAMUSCULAR | Status: DC | PRN
  Administered 2020-10-29 – 2020-10-30 (×4): 1 mg via INTRAVENOUS
  Filled 2020-10-29 (×4): qty 1

## 2020-10-29 MED ORDER — OXYCODONE HCL 5 MG PO TABS
5.0000 mg | ORAL_TABLET | ORAL | Status: DC | PRN
  Administered 2020-10-31 – 2020-11-05 (×12): 10 mg via ORAL
  Administered 2020-11-05: 5 mg via ORAL
  Administered 2020-11-05 – 2020-11-06 (×2): 10 mg via ORAL
  Administered 2020-11-06: 5 mg via ORAL
  Administered 2020-11-07 – 2020-11-08 (×2): 10 mg via ORAL
  Filled 2020-10-29 (×10): qty 2
  Filled 2020-10-29 (×2): qty 1
  Filled 2020-10-29 (×6): qty 2

## 2020-10-29 MED ORDER — DOCUSATE SODIUM 100 MG PO CAPS
100.0000 mg | ORAL_CAPSULE | Freq: Two times a day (BID) | ORAL | Status: DC
  Administered 2020-10-29 – 2020-11-10 (×14): 100 mg via ORAL
  Filled 2020-10-29 (×21): qty 1

## 2020-10-29 MED ORDER — SODIUM CHLORIDE 0.9 % IV SOLN: INTRAVENOUS | Status: DC

## 2020-10-29 MED ORDER — ALBUMIN HUMAN 5 % IV SOLN: 12.5000 g | Freq: Once | INTRAVENOUS | Status: AC

## 2020-10-29 MED ORDER — CINACALCET HCL 30 MG PO TABS
60.0000 mg | ORAL_TABLET | Freq: Every day | ORAL | Status: DC
  Administered 2020-10-31 – 2020-11-09 (×8): 60 mg via ORAL
  Filled 2020-10-29 (×9): qty 2

## 2020-10-29 MED ORDER — OXYCODONE HCL 5 MG PO TABS
ORAL_TABLET | ORAL | Status: AC
  Administered 2020-10-29: 5 mg via ORAL
  Filled 2020-10-29: qty 1

## 2020-10-29 MED ORDER — METHOCARBAMOL 500 MG PO TABS
ORAL_TABLET | ORAL | Status: AC
  Administered 2020-10-29: 500 mg via ORAL
  Filled 2020-10-29: qty 1

## 2020-10-29 MED ORDER — METHOCARBAMOL 500 MG PO TABS
500.0000 mg | ORAL_TABLET | Freq: Four times a day (QID) | ORAL | Status: DC | PRN
  Administered 2020-10-29 – 2020-11-08 (×21): 500 mg via ORAL
  Filled 2020-10-29 (×21): qty 1

## 2020-10-29 MED ORDER — MAGNESIUM CITRATE PO SOLN: 1.0000 | Freq: Once | ORAL | Status: DC | PRN

## 2020-10-29 MED ORDER — OXYCODONE HCL 5 MG PO TABS
10.0000 mg | ORAL_TABLET | ORAL | Status: DC | PRN
  Administered 2020-10-29 – 2020-10-31 (×6): 15 mg via ORAL
  Filled 2020-10-29 (×5): qty 3

## 2020-10-29 MED ORDER — CHLORHEXIDINE GLUCONATE 0.12% ORAL RINSE (MEDLINE KIT): 15.0000 mL | OROMUCOSAL | Status: AC

## 2020-10-29 MED ORDER — MIDAZOLAM HCL 2 MG/2ML IJ SOLN
INTRAMUSCULAR | Status: AC
  Filled 2020-10-29: qty 2

## 2020-10-29 MED ORDER — PHENYLEPHRINE HCL (PRESSORS) 10 MG/ML IV SOLN
INTRAVENOUS | Status: DC | PRN
  Administered 2020-10-29: 40 ug via INTRAVENOUS

## 2020-10-29 MED ORDER — ONDANSETRON HCL 4 MG/2ML IJ SOLN
4.0000 mg | Freq: Four times a day (QID) | INTRAMUSCULAR | Status: DC | PRN
  Administered 2020-10-31: 4 mg via INTRAVENOUS
  Filled 2020-10-29: qty 2

## 2020-10-29 MED ORDER — ALBUMIN HUMAN 5 % IV SOLN
INTRAVENOUS | Status: AC
  Administered 2020-10-29: 12.5 g via INTRAVENOUS
  Filled 2020-10-29: qty 250

## 2020-10-29 MED ORDER — MIDAZOLAM HCL 5 MG/5ML IJ SOLN
INTRAMUSCULAR | Status: DC | PRN
  Administered 2020-10-29: 2 mg via INTRAVENOUS

## 2020-10-29 MED ORDER — METHOCARBAMOL 1000 MG/10ML IJ SOLN
500.0000 mg | Freq: Four times a day (QID) | INTRAVENOUS | Status: DC | PRN
  Filled 2020-10-29 (×5): qty 5

## 2020-10-29 MED ORDER — BISACODYL 10 MG RE SUPP: 10.0000 mg | Freq: Every day | RECTAL | Status: DC | PRN

## 2020-10-29 MED ORDER — CEFAZOLIN SODIUM-DEXTROSE 2-4 GM/100ML-% IV SOLN
2.0000 g | INTRAVENOUS | Status: DC
  Filled 2020-10-29: qty 100

## 2020-10-29 MED ORDER — ACETAMINOPHEN 325 MG PO TABS
325.0000 mg | ORAL_TABLET | Freq: Four times a day (QID) | ORAL | Status: DC | PRN
  Administered 2020-10-30 – 2020-11-10 (×11): 650 mg via ORAL
  Filled 2020-10-29 (×11): qty 2

## 2020-10-29 MED ORDER — METOCLOPRAMIDE HCL 5 MG PO TABS: 5.0000 mg | ORAL_TABLET | Freq: Three times a day (TID) | ORAL | Status: DC | PRN

## 2020-10-29 MED ORDER — FENTANYL CITRATE (PF) 100 MCG/2ML IJ SOLN
INTRAMUSCULAR | Status: DC | PRN
  Administered 2020-10-29: 50 ug via INTRAVENOUS
  Administered 2020-10-29: 100 ug via INTRAVENOUS

## 2020-10-29 MED ORDER — CEFAZOLIN SODIUM-DEXTROSE 2-3 GM-%(50ML) IV SOLR
INTRAVENOUS | Status: DC | PRN
  Administered 2020-10-29: 2 g via INTRAVENOUS

## 2020-10-29 MED ORDER — METOCLOPRAMIDE HCL 5 MG/ML IJ SOLN: 5.0000 mg | Freq: Three times a day (TID) | INTRAMUSCULAR | Status: DC | PRN

## 2020-10-29 MED ORDER — PROPOFOL 10 MG/ML IV BOLUS
INTRAVENOUS | Status: DC | PRN
  Administered 2020-10-29: 120 mg via INTRAVENOUS

## 2020-10-29 MED ORDER — ONDANSETRON HCL 4 MG PO TABS: 4.0000 mg | ORAL_TABLET | Freq: Four times a day (QID) | ORAL | Status: DC | PRN

## 2020-10-29 SURGICAL SUPPLY — 44 items
BLADE SAW RECIP 87.9 MT (BLADE) ×2 IMPLANT
BLADE SURG 21 STRL SS (BLADE) ×2 IMPLANT
BNDG COHESIVE 6X5 TAN STRL LF (GAUZE/BANDAGES/DRESSINGS) ×2 IMPLANT
CANISTER WOUND CARE 500ML ATS (WOUND CARE) IMPLANT
CANISTER WOUNDNEG PRESSURE 500 (CANNISTER) ×1 IMPLANT
COVER SURGICAL LIGHT HANDLE (MISCELLANEOUS) ×2 IMPLANT
COVER WAND RF STERILE (DRAPES) IMPLANT
CUFF TOURN SGL QUICK 34 (TOURNIQUET CUFF)
CUFF TRNQT CYL 34X4.125X (TOURNIQUET CUFF) IMPLANT
DRAPE DERMATAC (DRAPES) ×1 IMPLANT
DRAPE INCISE IOBAN 66X45 STRL (DRAPES) ×4 IMPLANT
DRAPE U-SHAPE 47X51 STRL (DRAPES) ×2 IMPLANT
DRESSING PREVENA PLUS CUSTOM (GAUZE/BANDAGES/DRESSINGS) ×1 IMPLANT
DRSG PREVENA PLUS CUSTOM (GAUZE/BANDAGES/DRESSINGS) ×2
DURAPREP 26ML APPLICATOR (WOUND CARE) ×2 IMPLANT
ELECT REM PT RETURN 9FT ADLT (ELECTROSURGICAL) ×2
ELECTRODE REM PT RTRN 9FT ADLT (ELECTROSURGICAL) ×1 IMPLANT
GLOVE BIOGEL PI IND STRL 7.5 (GLOVE) ×1 IMPLANT
GLOVE BIOGEL PI IND STRL 9 (GLOVE) ×1 IMPLANT
GLOVE BIOGEL PI INDICATOR 7.5 (GLOVE) ×1
GLOVE BIOGEL PI INDICATOR 9 (GLOVE) ×1
GLOVE SURG ORTHO 9.0 STRL STRW (GLOVE) ×2 IMPLANT
GLOVE SURG SS PI 6.5 STRL IVOR (GLOVE) ×2 IMPLANT
GOWN STRL REUS W/ TWL LRG LVL3 (GOWN DISPOSABLE) ×1 IMPLANT
GOWN STRL REUS W/ TWL XL LVL3 (GOWN DISPOSABLE) ×2 IMPLANT
GOWN STRL REUS W/TWL LRG LVL3 (GOWN DISPOSABLE) ×1
GOWN STRL REUS W/TWL XL LVL3 (GOWN DISPOSABLE) ×2
KIT BASIN OR (CUSTOM PROCEDURE TRAY) ×2 IMPLANT
KIT TURNOVER KIT B (KITS) ×2 IMPLANT
MANIFOLD NEPTUNE II (INSTRUMENTS) ×2 IMPLANT
NS IRRIG 1000ML POUR BTL (IV SOLUTION) ×2 IMPLANT
PACK ORTHO EXTREMITY (CUSTOM PROCEDURE TRAY) ×2 IMPLANT
PAD ARMBOARD 7.5X6 YLW CONV (MISCELLANEOUS) ×2 IMPLANT
PREVENA RESTOR ARTHOFORM 46X30 (CANNISTER) ×2 IMPLANT
STAPLER VISISTAT 35W (STAPLE) IMPLANT
STOCKINETTE IMPERVIOUS LG (DRAPES) IMPLANT
SUT ETHILON 2 0 PSLX (SUTURE) ×4 IMPLANT
SUT SILK 2 0 (SUTURE) ×1
SUT SILK 2-0 18XBRD TIE 12 (SUTURE) ×1 IMPLANT
SUT VIC AB 1 CTX 36 (SUTURE) ×1
SUT VIC AB 1 CTX36XBRD ANBCTR (SUTURE) IMPLANT
TOWEL GREEN STERILE FF (TOWEL DISPOSABLE) ×2 IMPLANT
TUBE CONNECTING 20X1/4 (TUBING) ×2 IMPLANT
YANKAUER SUCT BULB TIP NO VENT (SUCTIONS) ×2 IMPLANT

## 2020-10-29 NOTE — Anesthesia Procedure Notes (Signed)
Procedure Name: LMA Insertion Date/Time: 10/29/2020 2:03 PM Performed by: Hoy Morn, CRNA Pre-anesthesia Checklist: Patient identified, Emergency Drugs available, Suction available and Patient being monitored Patient Re-evaluated:Patient Re-evaluated prior to induction Oxygen Delivery Method: Circle system utilized Preoxygenation: Pre-oxygenation with 100% oxygen Induction Type: IV induction Ventilation: Mask ventilation without difficulty LMA: LMA inserted LMA Size: 4.0 Tube type: Oral Number of attempts: 1 Airway Equipment and Method: Oral airway Placement Confirmation: positive ETCO2 and breath sounds checked- equal and bilateral Tube secured with: Tape Dental Injury: Teeth and Oropharynx as per pre-operative assessment

## 2020-10-29 NOTE — H&P (View-Only) (Signed)
Patient ID: Patrick Anthony, male   DOB: 06-07-1971, 50 y.o.   MRN: [id number] Patient is seen in follow-up for necrotic gangrenous changes right tibia.  Patient's questions were answered patient states that he is still nervous about surgery but would like to proceed with a right above-the-knee amputation at this time patient is n.p.o. today plan for surgery this afternoon.  Anticipate patient would be a good candidate for CIR

## 2020-10-29 NOTE — Op Note (Signed)
10/29/2020  3:14 PM  PATIENT:  Patrick Anthony    PRE-OPERATIVE DIAGNOSIS:  Osteomyelitis Right tibia  POST-OPERATIVE DIAGNOSIS:  Same  PROCEDURE:  RIGHT ABOVE KNEE AMPUTATION Application of Prevena customizable and arthroform wound VAC.  SURGEON:  [name], MD  PHYSICIAN ASSISTANT:None ANESTHESIA:   General  PREOPERATIVE INDICATIONS:  Patrick Anthony is a  50 y.o. male with a diagnosis of Osteomyelitis Right tibia who failed conservative measures and elected for surgical management.    The risks benefits and alternatives were discussed with the patient preoperatively including but not limited to the risks of infection, bleeding, nerve injury, cardiopulmonary complications, the need for revision surgery, among others, and the patient was willing to proceed.  OPERATIVE IMPLANTS: Praveena customizable and Arthur form wound VAC.  '@ENCIMAGES'$ @  OPERATIVE FINDINGS: Muscle healthy and viable no evidence of infection at the level of the amputation.  OPERATIVE PROCEDURE: Patient was brought the operating room and underwent a general anesthetic.  After adequate levels anesthesia were obtained patient's right lower extremity was prepped using DuraPrep draped into a sterile field the leg ulcer and osteomyelitis was draped out of the sterile field with impervious stockinette.  A fishmouth incision was made just proximal to the patella this was carried down to extra articularly to the femur a reciprocating saw was used to resect the femur.  The vascular bundle was clamped medially and suture ligated with 2-0 silk.  The amputation was completed the muscle margins were healthy and viable with good color and contractility.  The wound was irrigated with normal saline the fascia was closed using #1 Vicryl the skin was closed using 2-0 nylon and staples.  The Prevena customizable and arthroform wound VAC was applied this had a good suction fit outlined with derma tack.  Patient was extubated taken the PACU  in stable condition.   DISCHARGE PLANNING:  Antibiotic duration: Continue antibiotics as needed for the mitral valve.  Weightbearing: Nonweightbearing on the right  Pain medication: Opioid pathway  Dressing care/ Wound VAC: Continue wound VAC for 1 week  Ambulatory devices: Walker or crutches  Discharge to: Inpatient or outpatient rehab.  Follow-up: In the office 1 week post operative.

## 2020-10-29 NOTE — Progress Notes (Signed)
Patient ID: Patrick Anthony, male   DOB: June 23, 1971, 50 y.o.   MRN: [id number] Patient is seen in follow-up for necrotic gangrenous changes right tibia.  Patient's questions were answered patient states that he is still nervous about surgery but would like to proceed with a right above-the-knee amputation at this time patient is n.p.o. today plan for surgery this afternoon.  Anticipate patient would be a good candidate for CIR

## 2020-10-29 NOTE — Interval H&P Note (Signed)
History and Physical Interval Note:  10/29/2020 7:37 AM  Patrick Anthony  has presented today for surgery, with the diagnosis of Osteomyelitis Right tibia.  The various methods of treatment have been discussed with the patient and family. After consideration of risks, benefits and other options for treatment, the patient has consented to  Procedure(s): RIGHT ABOVE KNEE AMPUTATION (Right) as a surgical intervention.  The patient's history has been reviewed, patient examined, no change in status, stable for surgery.  I have reviewed the patient's chart and labs.  Questions were answered to the patient's satisfaction.     [name]

## 2020-10-29 NOTE — Progress Notes (Addendum)
PROGRESS NOTE    Patrick Anthony  [id number] DOB: 18-Jul-1971 DOA: 09/27/2020 PCP: [name], MD   Chief Complaint  Patient presents with  . 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  Seen by ID managed with IV antibiotic, CT surgery consult salt was obtained subsequently underwent minimally invasive mitral valve replacement on 10/14/2020 and was managed under CT surgery service and subsequently transferred to [facility] on 3/16.  Patient was followed by ID, orthopedics.  He was found to have swelling on the right leg and seen by orthopedic underwent debridement on the right leg and treated for large complicated abscess with osteomyelitis involving his tibia with 7 by X.3 centimeter area of bone that was excised. Patient was seen by Dr. Sharol Given orthopedic ID and the right above-the-knee amputation has been recommended moving forward to manage this infection. Patient has been told by multiple providers multiple times the need for the above-the-knee amputation to avoid seeding of his new valve and his graft but has been reluctant and he is aware about dying from staph aureus bacteremia and sepsis. Patient finally agreed for AKA on 3/24  Subjective: Seen and examined. Resting comfortably has no new complaints. N.p.o. for AKA this afternoon.   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  Followed by Dr. Sharol Given, vascular,CT surgery.  Repeat blood cultures from 10/13/20 are negative. Patient has been advised to undergo right AKA to avoid seeding of his new valve and skin graft.  He was refusing surgery but finally agreed for AKA Dr. Sharol Given planning for AKA on the right today.  ESRD and HD [id number] dialysis with left upper extremity AVG. defer to nephrology  Secondary hyperparathyroidism-calcium and phosphorus stable continue Sensipar binders and Hectorol per nephrology.  Monitor lab  Anemia of chronic kidney disease: Hemoglobin overall stable.  Continue darbepoetin No IV iron due to osteomyelitis.  Status post 2 unit PRBC so far. Recent Labs  Lab 10/25/20 0154 10/26/20 0138 10/27/20 0122 10/28/20 0152 10/29/20 0138  HGB 8.9* 9.7* 8.9* 10.0* 8.5*  HCT 27.9* 30.3* 27.4* 32.3* 27.7*   History of hepatitis C/liver cirrhosis-outpatient follow-up with ID.Monitor LFTs intermittently.  He has very low albumin. Recent Labs  Lab 10/25/20 0154 10/26/20 0138 10/27/20 0122 10/28/20 0152 10/29/20 0138  ALBUMIN 2.0* 2.1* 2.1* 2.2* 2.1*   Hyponatremia sodium improved to 129-132.  Hypertension blood pressure is controlled on metoprolol and clonidine patch.  Nasal bleeding on 3/20 resolved.  Diet Order            Diet renal with fluid restriction Fluid restriction: 1200 mL Fluid; Room service appropriate? Yes; Fluid consistency: Thin  Diet effective now  Nutrition Problem: Increased nutrient needs Etiology: chronic illness (ESRD on HD) Signs/Symptoms: estimated needs Interventions: Nepro shake,Refer to RD note for recommendations Patient's Body mass  index is 23.08 kg/m.  DVT prophylaxis: SCDs Start: 10/20/20 1721 enoxaparin (LOVENOX) injection 30 mg Start: 10/19/20 0800 SCDs Start: 10/15/20 0610 Code Status:   Code Status: Full Code  Family Communication:plan of care discussed with patient at bedside. I had discussed with [name] extensively 3/23  Status [id number] Remains inpatient appropriate because:IV treatments appropriate due to intensity of illness or inability to take PO and Inpatient level of care appropriate due to severity of illness  Dispo:The patient is from: Home            Anticipated d/c is to: Home likely will need CIR post AKA            Patient currently is not medically stable to d/c.            Difficult to place patient No  Unresulted Labs (From admission, onward)          Start     Ordered   10/21/20 0500  Renal function panel  Daily,   R     Question:  Specimen collection method  Answer:  Lab=Lab collect   10/20/20 2128   10/21/20 0500  CBC with Differential/Platelet  Daily,   R     Question:  Specimen collection method  Answer:  Lab=Lab collect   10/20/20 2128   Signed and Held  Renal function panel  Once,   R       Question:  Specimen collection method  Answer:  Lab=Lab collect   Signed and Held         Medications reviewed:  Scheduled Meds: . (feeding supplement) PROSource Plus  30 mL Oral BID BM  . aspirin EC  325 mg Oral Daily  . calcium acetate  667 mg Oral TID WC  . Chlorhexidine Gluconate Cloth  6 each Topical Q0600  . cinacalcet  30 mg Oral Q breakfast  . cloNIDine  0.3 mg Transdermal Weekly  . darbepoetin (ARANESP) injection - DIALYSIS  60 mcg Intravenous Q Fri-HD  . doxercalciferol  3 mcg Intravenous Q M,W,F-HD  . enoxaparin (LOVENOX) injection  30 mg Subcutaneous Q48H  . feeding supplement (NEPRO CARB STEADY)  237 mL Oral TID BM  . gabapentin  200 mg Oral QHS  . influenza vac split quadrivalent PF  0.5 mL Intramuscular Tomorrow-1000  . metoprolol tartrate  50 mg  Oral BID  . multivitamin  1 tablet Oral QHS  . oxymetazoline  1 spray Each Nare BID  . pantoprazole  40 mg Oral Daily  . senna-docusate  1 tablet Oral BID  . sodium chloride flush  3 mL Intravenous Q12H   Continuous Infusions: . sodium chloride    . sodium chloride    . sodium chloride    .  ceFAZolin (ANCEF) IV    . vancomycin 750 mg (10/27/20 2116)    Consultants:see note  Procedures:see note  Antimicrobials: Anti-infectives (From admission, onward)   Start     Dose/Rate Route Frequency Ordered Stop   10/29/20 1000  ceFAZolin (ANCEF) IVPB 2g/100 mL premix        2 g 200 mL/hr over 30 Minutes Intravenous To Short Stay 10/29/20 0730 10/30/20 1000   10/27/20 2015  vancomycin (VANCOCIN) IVPB 750 mg/150 ml premix        750 mg 150 mL/hr over 60 Minutes Intravenous Every M-W-F (Hemodialysis)  10/27/20 2005     10/25/20 0922  vancomycin (VANCOCIN) 500-5 MG/100ML-% IVPB       Note to Pharmacy: Kalman Shan   : cabinet override      10/25/20 0922 10/25/20 0958   10/21/20 0600  ceFAZolin (ANCEF) IVPB 2g/100 mL premix        2 g 200 mL/hr over 30 Minutes Intravenous On call to O.R. 10/20/20 1349 10/21/20 0650   10/20/20 2243  vancomycin (VANCOCIN) 500-5 MG/100ML-% IVPB       Note to Pharmacy: Wallace Cullens   : cabinet override      10/20/20 2243 10/20/20 2243   10/20/20 1437  ceFAZolin (ANCEF) 2-4 GM/100ML-% IVPB       Note to Pharmacy: Ladoris Gene   : cabinet override      10/20/20 1437 10/20/20 1612   10/20/20 1200  vancomycin (VANCOREADY) IVPB 500 mg/100 mL  Status:  Discontinued        500 mg 100 mL/hr over 60 Minutes Intravenous Every M-W-F (Hemodialysis) 10/20/20 0903 10/27/20 2005   10/18/20 1124  vancomycin variable dose per unstable renal function (pharmacist dosing)  Status:  Discontinued         Does not apply See admin instructions 10/18/20 1124 10/20/20 0903   10/16/20 1600  vancomycin (VANCOREADY) IVPB 500 mg/100 mL  Status:  Discontinued        500 mg 100  mL/hr over 60 Minutes Intravenous Every Sat (Hemodialysis) 10/16/20 1306 10/16/20 1312   10/15/20 1500  vancomycin (VANCOCIN) IVPB 500 mg/100 ml premix  Status:  Discontinued        500 mg 100 mL/hr over 60 Minutes Intravenous Every M-W-F (Hemodialysis) 10/15/20 1357 10/17/20 0942   10/14/20 2130  vancomycin (VANCOCIN) IVPB 1000 mg/200 mL premix  Status:  Discontinued        1,000 mg 200 mL/hr over 60 Minutes Intravenous  Once 10/14/20 1403 10/14/20 1547   10/14/20 2130  vancomycin (VANCOREADY) IVPB 500 mg/100 mL        500 mg 100 mL/hr over 60 Minutes Intravenous  Once 10/14/20 1547 10/14/20 2222   10/14/20 1730  cefUROXime (ZINACEF) 1.5 g in sodium chloride 0.9 % 100 mL IVPB        1.5 g 200 mL/hr over 30 Minutes Intravenous Every 24 hr x 2 10/14/20 1403 10/15/20 1718   10/14/20 1128  vancomycin (VANCOCIN) 1,000 mg in sodium chloride 0.9 % 1,000 mL irrigation  Status:  Discontinued          As needed 10/14/20 1128 10/14/20 1401   10/14/20 0400  vancomycin (VANCOREADY) IVPB 1250 mg/250 mL        1,250 mg 166.7 mL/hr over 90 Minutes Intravenous To Surgery 10/13/20 1039 10/14/20 0910   10/14/20 0400  cefUROXime (ZINACEF) 1.5 g in sodium chloride 0.9 % 100 mL IVPB        1.5 g 200 mL/hr over 30 Minutes Intravenous To Surgery 10/13/20 1039 10/14/20 0835   10/14/20 0400  cefUROXime (ZINACEF) 750 mg in sodium chloride 0.9 % 100 mL IVPB        750 mg 200 mL/hr over 30 Minutes Intravenous To Surgery 10/13/20 1039 10/14/20 1250   10/14/20 0400  vancomycin (VANCOCIN) 1,000 mg in sodium chloride 0.9 % 1,000 mL irrigation  Status:  Discontinued         Irrigation To Surgery 10/13/20 1039 10/14/20 1403   10/13/20 1657  vancomycin (VANCOCIN) 500-5 MG/100ML-% IVPB       Note to  Pharmacy: Judieth Keens  : cabinet override      10/13/20 1657 10/13/20 1717   10/12/20 0600  ceFAZolin (ANCEF) IVPB 2g/100 mL premix        2 g 200 mL/hr over 30 Minutes Intravenous On call to O.R. 10/11/20 1841  10/12/20 1315   10/11/20 1415  vancomycin (VANCOCIN) IVPB 500 mg/100 ml premix  Status:  Discontinued        500 mg 100 mL/hr over 60 Minutes Intravenous Every M-W-F (Hemodialysis) 10/11/20 1413 10/14/20 1403   10/11/20 1413  vancomycin (VANCOCIN) 500-5 MG/100ML-% IVPB       Note to Pharmacy: Kalman Shan   : cabinet override      10/11/20 1413 10/12/20 0229   10/11/20 1353  Vancomycin (VANCOCIN) 750-5 MG/150ML-% IVPB  Status:  Discontinued       Note to Pharmacy: Kalman Shan   : cabinet override      10/11/20 1353 10/11/20 1417   10/08/20 1200  vancomycin (VANCOCIN) IVPB 750 mg/150 ml premix  Status:  Discontinued        750 mg 150 mL/hr over 60 Minutes Intravenous Every M-W-F (Hemodialysis) 10/05/20 1404 10/11/20 1413   10/06/20 1619  vancomycin (VANCOCIN) 500-5 MG/100ML-% IVPB       Note to Pharmacy: Judieth Keens  : cabinet override      10/06/20 1619 10/06/20 1736   10/06/20 1200  vancomycin (VANCOCIN) IVPB 750 mg/150 ml premix  Status:  Discontinued        750 mg 150 mL/hr over 60 Minutes Intravenous Every M-W-F (Hemodialysis) 10/04/20 0905 10/05/20 1404   10/06/20 1200  vancomycin (VANCOCIN) IVPB 500 mg/100 ml premix        500 mg 100 mL/hr over 60 Minutes Intravenous Every Wed (Hemodialysis) 10/05/20 1404 10/06/20 2000   10/01/20 2000  DAPTOmycin (CUBICIN) 840 mg in sodium chloride 0.9 % IVPB  Status:  Discontinued        840 mg 233.6 mL/hr over 30 Minutes Intravenous Every 48 hours 10/01/20 0922 10/01/20 1056   10/01/20 1200  vancomycin (VANCOCIN) IVPB 1000 mg/200 mL premix  Status:  Discontinued        1,000 mg 200 mL/hr over 60 Minutes Intravenous Every M-W-F (Hemodialysis) 10/01/20 1056 10/04/20 0845   09/29/20 1200  vancomycin (VANCOCIN) IVPB 1000 mg/200 mL premix  Status:  Discontinued        1,000 mg 200 mL/hr over 60 Minutes Intravenous Every M-W-F (Hemodialysis) 09/27/20 1443 10/01/20 0922   09/29/20 1200  ceFEPIme (MAXIPIME) 2 g in sodium chloride 0.9 % 100 mL  IVPB  Status:  Discontinued        2 g 200 mL/hr over 30 Minutes Intravenous Every M-W-F (Hemodialysis) 09/27/20 1443 09/28/20 1054   09/28/20 0900  metroNIDAZOLE (FLAGYL) IVPB 500 mg  Status:  Discontinued        500 mg 100 mL/hr over 60 Minutes Intravenous Every 12 hours 09/27/20 1808 09/28/20 1054   09/28/20 0900  ceFEPIme (MAXIPIME) 1 g in sodium chloride 0.9 % 100 mL IVPB  Status:  Discontinued        1 g 200 mL/hr over 30 Minutes Intravenous Every 12 hours 09/27/20 1808 09/27/20 1812   09/27/20 1445  vancomycin (VANCOREADY) IVPB 2000 mg/400 mL        2,000 mg 200 mL/hr over 120 Minutes Intravenous  Once 09/27/20 1433 09/27/20 2330   09/27/20 1430  ceFEPIme (MAXIPIME) 2 g in sodium chloride 0.9 % 100 mL IVPB  2 g 200 mL/hr over 30 Minutes Intravenous  Once 09/27/20 1418 09/27/20 1541   09/27/20 1430  metroNIDAZOLE (FLAGYL) IVPB 500 mg        500 mg 100 mL/hr over 60 Minutes Intravenous  Once 09/27/20 1418 09/27/20 2214   09/27/20 1430  vancomycin (VANCOREADY) IVPB 1750 mg/350 mL  Status:  Discontinued        1,750 mg 175 mL/hr over 120 Minutes Intravenous  Once 09/27/20 1418 09/27/20 1433     Culture/Microbiology    Component Value Date/Time   SDES TISSUE 10/20/2020 1612   SPECREQUEST RIGHT LEG ABSC SPEC A 10/20/2020 1612   CULT  10/20/2020 1612    FEW METHICILLIN RESISTANT STAPHYLOCOCCUS AUREUS CRITICAL RESULT CALLED TO, READ BACK BY AND VERIFIED WITH: RN N.CROSFON AT 0954 ON 10/22/2020 BY T.SAAD NO ANAEROBES ISOLATED Performed at [facility], [place] [address], [place], [place] [address]    REPTSTATUS 10/26/2020 FINAL 10/20/2020 1612    Other culture-see note  Objective: Vitals: Today's Vitals   10/28/20 2338 10/29/20 0401 10/29/20 0505 10/29/20 0745  BP: 126/89 (!) 132/97  116/79  Pulse: 84 82  79  Resp: '20 17  18  '$ Temp: 98 F (36.7 C) 98.1 F (36.7 C)  98 F (36.7 C)  TempSrc: Oral Oral  Oral  SpO2: 99% 99%  97%  Weight:  77.2 kg    Height:       PainSc:   Asleep    No intake or output data in the 24 hours ending 10/29/20 0753 Filed Weights   10/27/20 2218 10/28/20 0350 10/29/20 0401  Weight: 76.9 kg 75.9 kg 77.2 kg   Weight change: -2.698 kg  Intake/Output from previous day: No intake/output data recorded. Intake/Output this shift: No intake/output data recorded. Filed Weights   10/27/20 2218 10/28/20 0350 10/29/20 0401  Weight: 76.9 kg 75.9 kg 77.2 kg   Examination: General exam: AAOx3,NAD, weak appearing. HEENT:Oral mucosa moist, Ear/Nose WNL grossly, dentition normal. Respiratory system: bilaterally diminishedd,no wheezing or crackles,no use of accessory muscle Cardiovascular system: S1 & S2 +, No JVD,. Gastrointestinal system: Abdomen soft, NT,ND, BS+ Nervous System:Alert, awake, moving extremities and grossly nonfocal Extremities: Right lower extremity with wound wrapped in dressing no edema, distal peripheral pulses palpable.  Skin: No rashes,no icterus. MSK: Normal muscle bulk,tone, power  Data Reviewed: I have personally reviewed following labs and imaging studies CBC: Recent Labs  Lab 10/25/20 0154 10/26/20 0138 10/27/20 0122 10/28/20 0152 10/29/20 0138  WBC 12.5* 11.5* 14.2* 10.9* 10.0  NEUTROABS 8.9* 8.2* 10.6* 8.0* 6.8  HGB 8.9* 9.7* 8.9* 10.0* 8.5*  HCT 27.9* 30.3* 27.4* 32.3* 27.7*  MCV 89.1 89.6 89.5 90.0 91.1  PLT 414* 435* 451* 415* [id number]   Basic Metabolic Panel: Recent Labs  Lab 10/25/20 0154 10/26/20 0138 10/27/20 0122 10/28/20 0152 10/29/20 0138  NA 130* 132* 129* 132* 130*  K 4.3 3.6 4.0 3.9 4.1  CL 92* 93* 90* 95* 93*  CO2 '26 28 26 26 25  '$ GLUCOSE 97 116* 108* 115* 111*  BUN 48* 34* 50* 22* 38*  CREATININE 8.37* 6.03* 7.90* 4.75* 7.11*  CALCIUM 8.2* 8.7* 8.5* 8.5* 8.6*  PHOS 5.2* 4.7* 5.4* 3.2 4.7*   GFR: Estimated Creatinine Clearance: 13.7 mL/min (A) (by C-G formula based on SCr of 7.11 mg/dL (H)). Liver Function Tests: Recent Labs  Lab 10/25/20 0154 10/26/20 0138  10/27/20 0122 10/28/20 0152 10/29/20 0138  ALBUMIN 2.0* 2.1* 2.1* 2.2* 2.1*   No results for input(s): LIPASE, AMYLASE  in the last 168 hours. No results for input(s): AMMONIA in the last 168 hours. Coagulation Profile: No results for input(s): INR, PROTIME in the last 168 hours. Cardiac Enzymes: No results for input(s): CKTOTAL, CKMB, CKMBINDEX, TROPONINI in the last 168 hours. BNP (last 3 results) No results for input(s): PROBNP in the last 8760 hours. HbA1C: No results for input(s): HGBA1C in the last 72 hours. CBG: No results for input(s): GLUCAP in the last 168 hours. Lipid Profile: No results for input(s): CHOL, HDL, LDLCALC, TRIG, CHOLHDL, LDLDIRECT in the last 72 hours. Thyroid Function Tests: No results for input(s): TSH, T4TOTAL, FREET4, T3FREE, THYROIDAB in the last 72 hours. Anemia Panel: No results for input(s): VITAMINB12, FOLATE, FERRITIN, TIBC, IRON, RETICCTPCT in the last 72 hours. Sepsis Labs: No results for input(s): PROCALCITON, LATICACIDVEN in the last 168 hours.  Recent Results (from the past 240 hour(s))  Aerobic/Anaerobic Culture w Gram Stain (surgical/deep wound)     Status: None   Collection Time: 10/20/20  4:12 PM   Specimen: Soft Tissue, Other  Result Value Ref Range Status   Specimen Description TISSUE  Final   Special Requests RIGHT LEG ABSC SPEC A  Final   Gram Stain   Final    RARE WBC PRESENT,BOTH PMN AND MONONUCLEAR NO ORGANISMS SEEN    Culture   Final    FEW METHICILLIN RESISTANT STAPHYLOCOCCUS AUREUS CRITICAL RESULT CALLED TO, READ BACK BY AND VERIFIED WITH: RN N.CROSFON AT [id number] ON 10/22/2020 BY T.SAAD NO ANAEROBES ISOLATED Performed at [facility], [place] [address], [place], [place] [address]    Report Status 10/26/2020 FINAL  Final   Organism ID, Bacteria METHICILLIN RESISTANT STAPHYLOCOCCUS AUREUS  Final      Susceptibility   Methicillin resistant staphylococcus aureus - MIC*    CIPROFLOXACIN >=8 RESISTANT Resistant      ERYTHROMYCIN >=8 RESISTANT Resistant     GENTAMICIN <=0.5 SENSITIVE Sensitive     OXACILLIN >=4 RESISTANT Resistant     TETRACYCLINE <=1 SENSITIVE Sensitive     VANCOMYCIN 1 SENSITIVE Sensitive     TRIMETH/SULFA <=10 SENSITIVE Sensitive     CLINDAMYCIN <=0.25 SENSITIVE Sensitive     RIFAMPIN <=0.5 SENSITIVE Sensitive     Inducible Clindamycin NEGATIVE Sensitive     * FEW METHICILLIN RESISTANT STAPHYLOCOCCUS AUREUS  Fungus Stain     Status: None   Collection Time: 10/20/20  4:12 PM   Specimen: Soft Tissue, Other  Result Value Ref Range Status   FUNGUS STAIN Final report  Final    Comment: (NOTE) Performed At: [facility] [address] [place], [place] [id number] Rush Farmer MD [id number]    Fungal Source TISSUE  Final    Comment: RIGHT LEG ABSC SPEC A Performed at  [facility], [place] [address] [place], [place] [address]   Fungal Stain reflex     Status: None   Collection Time: 10/20/20  4:12 PM  Result Value Ref Range Status   Fungal stain result 1 Comment  Final    Comment: (NOTE) KOH/Calcofluor preparation:  no fungus observed. Performed At: [facility] [place], [place] [id number] Rush Farmer MD [id number]    Radiology Studies: No results found.  LOS: 32 days   Antonieta Pert, MD [facility]  10/29/2020, 7:53 AM

## 2020-10-29 NOTE — Transfer of Care (Signed)
Immediate Anesthesia Transfer of Care Note  Patient: Patrick Anthony  Procedure(s) Performed: RIGHT ABOVE KNEE AMPUTATION (Right Knee)  Patient Location: PACU  Anesthesia Type:General  Level of Consciousness: sedated  Airway & Oxygen Therapy: Patient Spontanous Breathing and Patient connected to face mask oxygen  Post-op Assessment: Report given to RN and Post -op Vital signs reviewed and stable  Post vital signs: Reviewed and stable  Last Vitals:  Vitals Value Taken Time  BP    Temp    Pulse    Resp    SpO2      Last Pain:  Vitals:   10/29/20 1052  TempSrc: Oral  PainSc:       Patients Stated Pain Goal: 2 ([id number])  Complications: No complications documented.

## 2020-10-29 NOTE — Progress Notes (Signed)
CR will sign off due to leg amputation. No educational needs. [name] CES, ACSM 7:44 AM 10/29/2020

## 2020-10-29 NOTE — Progress Notes (Signed)
Subjective:  Patient is now agreeable to AKA   Antibiotics:  Anti-infectives (From admission, onward)   Start     Dose/Rate Route Frequency Ordered Stop   10/29/20 1000  ceFAZolin (ANCEF) IVPB 2g/100 mL premix  Status:  Discontinued        2 g 200 mL/hr over 30 Minutes Intravenous To Short Stay 10/29/20 0730 10/29/20 1634   10/27/20 2015  vancomycin (VANCOCIN) IVPB 750 mg/150 ml premix        750 mg 150 mL/hr over 60 Minutes Intravenous Every M-W-F (Hemodialysis) 10/27/20 2005     10/25/20 0922  vancomycin (VANCOCIN) 500-5 MG/100ML-% IVPB       Note to Pharmacy: Kalman Shan   : cabinet override      10/25/20 0922 10/25/20 0958   10/21/20 0600  ceFAZolin (ANCEF) IVPB 2g/100 mL premix        2 g 200 mL/hr over 30 Minutes Intravenous On call to O.R. 10/20/20 1349 10/21/20 0650   10/20/20 2243  vancomycin (VANCOCIN) 500-5 MG/100ML-% IVPB       Note to Pharmacy: Wallace Cullens   : cabinet override      10/20/20 2243 10/20/20 2243   10/20/20 1437  ceFAZolin (ANCEF) 2-4 GM/100ML-% IVPB       Note to Pharmacy: Ladoris Gene   : cabinet override      10/20/20 1437 10/20/20 1612   10/20/20 1200  vancomycin (VANCOREADY) IVPB 500 mg/100 mL  Status:  Discontinued        500 mg 100 mL/hr over 60 Minutes Intravenous Every M-W-F (Hemodialysis) 10/20/20 0903 10/27/20 2005   10/18/20 1124  vancomycin variable dose per unstable renal function (pharmacist dosing)  Status:  Discontinued         Does not apply See admin instructions 10/18/20 1124 10/20/20 0903   10/16/20 1600  vancomycin (VANCOREADY) IVPB 500 mg/100 mL  Status:  Discontinued        500 mg 100 mL/hr over 60 Minutes Intravenous Every Sat (Hemodialysis) 10/16/20 1306 10/16/20 1312   10/15/20 1500  vancomycin (VANCOCIN) IVPB 500 mg/100 ml premix  Status:  Discontinued        500 mg 100 mL/hr over 60 Minutes Intravenous Every M-W-F (Hemodialysis) 10/15/20 1357 10/17/20 0942   10/14/20 2130  vancomycin (VANCOCIN) IVPB 1000  mg/200 mL premix  Status:  Discontinued        1,000 mg 200 mL/hr over 60 Minutes Intravenous  Once 10/14/20 1403 10/14/20 1547   10/14/20 2130  vancomycin (VANCOREADY) IVPB 500 mg/100 mL        500 mg 100 mL/hr over 60 Minutes Intravenous  Once 10/14/20 1547 10/14/20 2222   10/14/20 1730  cefUROXime (ZINACEF) 1.5 g in sodium chloride 0.9 % 100 mL IVPB        1.5 g 200 mL/hr over 30 Minutes Intravenous Every 24 hr x 2 10/14/20 1403 10/15/20 1718   10/14/20 1128  vancomycin (VANCOCIN) 1,000 mg in sodium chloride 0.9 % 1,000 mL irrigation  Status:  Discontinued          As needed 10/14/20 1128 10/14/20 1401   10/14/20 0400  vancomycin (VANCOREADY) IVPB 1250 mg/250 mL        1,250 mg 166.7 mL/hr over 90 Minutes Intravenous To Surgery 10/13/20 1039 10/14/20 0910   10/14/20 0400  cefUROXime (ZINACEF) 1.5 g in sodium chloride 0.9 % 100 mL IVPB        1.5 g 200 mL/hr over  30 Minutes Intravenous To Surgery 10/13/20 1039 10/14/20 0835   10/14/20 0400  cefUROXime (ZINACEF) 750 mg in sodium chloride 0.9 % 100 mL IVPB        750 mg 200 mL/hr over 30 Minutes Intravenous To Surgery 10/13/20 1039 10/14/20 1250   10/14/20 0400  vancomycin (VANCOCIN) 1,000 mg in sodium chloride 0.9 % 1,000 mL irrigation  Status:  Discontinued         Irrigation To Surgery 10/13/20 1039 10/14/20 1403   10/13/20 1657  vancomycin (VANCOCIN) 500-5 MG/100ML-% IVPB       Note to Pharmacy: Judieth Keens  : cabinet override      10/13/20 1657 10/13/20 1717   10/12/20 0600  ceFAZolin (ANCEF) IVPB 2g/100 mL premix        2 g 200 mL/hr over 30 Minutes Intravenous On call to O.R. 10/11/20 1841 10/12/20 1315   10/11/20 1415  vancomycin (VANCOCIN) IVPB 500 mg/100 ml premix  Status:  Discontinued        500 mg 100 mL/hr over 60 Minutes Intravenous Every M-W-F (Hemodialysis) 10/11/20 1413 10/14/20 1403   10/11/20 1413  vancomycin (VANCOCIN) 500-5 MG/100ML-% IVPB       Note to Pharmacy: Kalman Shan   : cabinet override       10/11/20 1413 10/12/20 0229   10/11/20 1353  Vancomycin (VANCOCIN) 750-5 MG/150ML-% IVPB  Status:  Discontinued       Note to Pharmacy: Kalman Shan   : cabinet override      10/11/20 1353 10/11/20 1417   10/08/20 1200  vancomycin (VANCOCIN) IVPB 750 mg/150 ml premix  Status:  Discontinued        750 mg 150 mL/hr over 60 Minutes Intravenous Every M-W-F (Hemodialysis) 10/05/20 1404 10/11/20 1413   10/06/20 1619  vancomycin (VANCOCIN) 500-5 MG/100ML-% IVPB       Note to Pharmacy: Judieth Keens  : cabinet override      10/06/20 1619 10/06/20 1736   10/06/20 1200  vancomycin (VANCOCIN) IVPB 750 mg/150 ml premix  Status:  Discontinued        750 mg 150 mL/hr over 60 Minutes Intravenous Every M-W-F (Hemodialysis) 10/04/20 0905 10/05/20 1404   10/06/20 1200  vancomycin (VANCOCIN) IVPB 500 mg/100 ml premix        500 mg 100 mL/hr over 60 Minutes Intravenous Every Wed (Hemodialysis) 10/05/20 1404 10/06/20 2000   10/01/20 2000  DAPTOmycin (CUBICIN) 840 mg in sodium chloride 0.9 % IVPB  Status:  Discontinued        840 mg 233.6 mL/hr over 30 Minutes Intravenous Every 48 hours 10/01/20 0922 10/01/20 1056   10/01/20 1200  vancomycin (VANCOCIN) IVPB 1000 mg/200 mL premix  Status:  Discontinued        1,000 mg 200 mL/hr over 60 Minutes Intravenous Every M-W-F (Hemodialysis) 10/01/20 1056 10/04/20 0845   09/29/20 1200  vancomycin (VANCOCIN) IVPB 1000 mg/200 mL premix  Status:  Discontinued        1,000 mg 200 mL/hr over 60 Minutes Intravenous Every M-W-F (Hemodialysis) 09/27/20 1443 10/01/20 0922   09/29/20 1200  ceFEPIme (MAXIPIME) 2 g in sodium chloride 0.9 % 100 mL IVPB  Status:  Discontinued        2 g 200 mL/hr over 30 Minutes Intravenous Every M-W-F (Hemodialysis) 09/27/20 1443 09/28/20 1054   09/28/20 0900  metroNIDAZOLE (FLAGYL) IVPB 500 mg  Status:  Discontinued        500 mg 100 mL/hr over 60 Minutes Intravenous Every 12 hours  09/27/20 1808 09/28/20 1054   09/28/20 0900  ceFEPIme  (MAXIPIME) 1 g in sodium chloride 0.9 % 100 mL IVPB  Status:  Discontinued        1 g 200 mL/hr over 30 Minutes Intravenous Every 12 hours 09/27/20 1808 09/27/20 1812   09/27/20 1445  vancomycin (VANCOREADY) IVPB 2000 mg/400 mL        2,000 mg 200 mL/hr over 120 Minutes Intravenous  Once 09/27/20 1433 09/27/20 2330   09/27/20 1430  ceFEPIme (MAXIPIME) 2 g in sodium chloride 0.9 % 100 mL IVPB        2 g 200 mL/hr over 30 Minutes Intravenous  Once 09/27/20 1418 09/27/20 1541   09/27/20 1430  metroNIDAZOLE (FLAGYL) IVPB 500 mg        500 mg 100 mL/hr over 60 Minutes Intravenous  Once 09/27/20 1418 09/27/20 2214   09/27/20 1430  vancomycin (VANCOREADY) IVPB 1750 mg/350 mL  Status:  Discontinued        1,750 mg 175 mL/hr over 120 Minutes Intravenous  Once 09/27/20 1418 09/27/20 1433      Medications: Scheduled Meds: . (feeding supplement) PROSource Plus  30 mL Oral BID BM  . aspirin EC  325 mg Oral Daily  . calcium acetate  667 mg Oral TID WC  . Chlorhexidine Gluconate Cloth  6 each Topical Q0600  . [START ON 10/30/2020] cinacalcet  60 mg Oral Q breakfast  . cloNIDine  0.3 mg Transdermal Weekly  . darbepoetin (ARANESP) injection - DIALYSIS  60 mcg Intravenous Q Fri-HD  . docusate sodium  100 mg Oral BID  . enoxaparin (LOVENOX) injection  30 mg Subcutaneous Q48H  . feeding supplement (NEPRO CARB STEADY)  237 mL Oral TID BM  . gabapentin  200 mg Oral QHS  . influenza vac split quadrivalent PF  0.5 mL Intramuscular Tomorrow-1000  . metoprolol tartrate  50 mg Oral BID  . multivitamin  1 tablet Oral QHS  . oxymetazoline  1 spray Each Nare BID  . pantoprazole  40 mg Oral Daily  . senna-docusate  1 tablet Oral BID  . sodium chloride flush  3 mL Intravenous Q12H   Continuous Infusions: . sodium chloride    . sodium chloride    . sodium chloride    . sodium chloride 10 mL/hr at 10/29/20 1351  . sodium chloride    . lactated ringers    . methocarbamol (ROBAXIN) IV    . vancomycin 750  mg (10/27/20 2116)   PRN Meds:.sodium chloride, sodium chloride, sodium chloride, [START ON 10/30/2020] acetaminophen, bisacodyl, camphor-menthol, diphenhydrAMINE, HYDROmorphone (DILAUDID) injection, HYDROmorphone, magnesium citrate, methocarbamol **OR** methocarbamol (ROBAXIN) IV, metoCLOPramide **OR** metoCLOPramide (REGLAN) injection, ondansetron **OR** ondansetron (ZOFRAN) IV, oxyCODONE, oxyCODONE, oxyCODONE, pentafluoroprop-tetrafluoroeth, polyethylene glycol, sodium chloride flush    Objective: Weight change: -2.698 kg  Intake/Output Summary (Last 24 hours) at 10/29/2020 1657 Last data filed at 10/29/2020 1500 Gross per 24 hour  Intake 475 ml  Output 100 ml  Net 375 ml   Blood pressure 117/89, pulse 79, temperature 98 F (36.7 C), temperature source Oral, resp. rate 20, height 6' 0.01" (1.829 m), weight 77.2 kg, SpO2 96 %. Temp:  [97.9 F (36.6 C)-98.7 F (37.1 C)] 98 F (36.7 C) (03/25 1638) Pulse Rate:  [79-86] 79 (03/25 1638) Resp:  [10-20] 20 (03/25 1638) BP: (88-137)/(58-101) 117/89 (03/25 1638) SpO2:  [96 %-100 %] 96 % (03/25 1638) Weight:  [77.2 kg] 77.2 kg (03/25 0401)  Physical Exam: Physical Exam Constitutional:  Appearance: He is well-developed.  HENT:     Head: Normocephalic and atraumatic.  Eyes:     Conjunctiva/sclera: Conjunctivae normal.  Cardiovascular:     Rate and Rhythm: Normal rate and regular rhythm.  Pulmonary:     Effort: Pulmonary effort is normal. No respiratory distress.     Breath sounds: No wheezing.  Abdominal:     General: There is no distension.     Palpations: Abdomen is soft.  Musculoskeletal:        General: Normal range of motion.     Cervical back: Normal range of motion and neck supple.  Skin:    General: Skin is warm and dry.     Findings: No erythema or rash.  Neurological:     Mental Status: He is alert and oriented to person, place, and time.  Psychiatric:        Attention and Perception: Attention normal.         Mood and Affect: Mood is depressed.        Behavior: Behavior normal.        Thought Content: Thought content normal.        Judgment: Judgment normal.     Right  leg wrapped  CBC:    BMET Recent Labs    10/28/20 0152 10/29/20 0138  NA 132* 130*  K 3.9 4.1  CL 95* 93*  CO2 26 25  GLUCOSE 115* 111*  BUN 22* 38*  CREATININE 4.75* 7.11*  CALCIUM 8.5* 8.6*     Liver Panel  Recent Labs    10/28/20 0152 10/29/20 0138  ALBUMIN 2.2* 2.1*       Sedimentation Rate No results for input(s): ESRSEDRATE in the last 72 hours. C-Reactive Protein No results for input(s): CRP in the last 72 hours.  Micro Results: Recent Results (from the past 720 hour(s))  Culture, blood (routine x 2)     Status: None   Collection Time: 09/30/20  6:04 AM   Specimen: BLOOD RIGHT FOREARM  Result Value Ref Range Status   Specimen Description BLOOD RIGHT FOREARM  Final   Special Requests   Final    BOTTLES DRAWN AEROBIC AND ANAEROBIC Blood Culture adequate volume   Culture   Final    NO GROWTH 5 DAYS Performed at [facility], [address], [place], [place] [address]    Report Status 10/05/2020 FINAL  Final  Culture, blood (routine x 2)     Status: Abnormal   Collection Time: 09/30/20  6:08 AM   Specimen: BLOOD RIGHT HAND  Result Value Ref Range Status   Specimen Description BLOOD RIGHT HAND  Final   Special Requests   Final    BOTTLES DRAWN AEROBIC AND ANAEROBIC Blood Culture results may not be optimal due to an inadequate volume of blood received in culture bottles   Culture  Setup Time   Final    GRAM POSITIVE COCCI IN CLUSTERS ANAEROBIC BOTTLE ONLY CRITICAL VALUE NOTED.  VALUE IS CONSISTENT WITH PREVIOUSLY REPORTED AND CALLED VALUE.    Culture (A)  Final    STAPHYLOCOCCUS AUREUS SUSCEPTIBILITIES PERFORMED ON PREVIOUS CULTURE WITHIN THE LAST 5 DAYS. Performed at [facility], [place] [address], [place], [place] [address]    Report Status 10/03/2020 FINAL  Final   Culture, blood (routine x 2)     Status: None   Collection Time: 10/03/20  2:42 AM   Specimen: BLOOD  Result Value Ref Range Status   Specimen Description BLOOD  LEFT ARM  Final   Special Requests   Final    BOTTLES DRAWN AEROBIC AND ANAEROBIC Blood Culture results may not be optimal due to an inadequate volume of blood received in culture bottles   Culture   Final    NO GROWTH 5 DAYS Performed at [facility], [place] [address] [place], [place] [address]    Report Status 10/08/2020 FINAL  Final  Culture, blood (routine x 2)     Status: None   Collection Time: 10/03/20  2:42 AM   Specimen: BLOOD  Result Value Ref Range Status   Specimen Description BLOOD LEFT HAND  Final   Special Requests   Final    BOTTLES DRAWN AEROBIC ONLY Blood Culture results may not be optimal due to an inadequate volume of blood received in culture bottles   Culture   Final    NO GROWTH 5 DAYS Performed at [facility], [place] [address] [place], [place] [address]    Report Status 10/08/2020 FINAL  Final  Surgical pcr screen     Status: Abnormal   Collection Time: 10/11/20  6:44 PM   Specimen: Nasal Mucosa; Nasal Swab  Result Value Ref Range Status   MRSA, PCR POSITIVE (A) NEGATIVE Final    Comment: RESULT CALLED TO, READ BACK BY AND VERIFIED WITH: F COMBRES RN 10/11/20 2339 JDW    Staphylococcus aureus POSITIVE (A) NEGATIVE Final    Comment: (NOTE) The Xpert SA Assay (FDA approved for NASAL specimens in patients 33 years of age and older), is one component of a comprehensive surveillance program. It is not intended to diagnose infection nor to guide or monitor treatment. Performed at [facility], [place] [address] [place], [place] [address]   Culture, blood (routine x 2)     Status: None   Collection Time: 10/13/20 10:43 PM   Specimen: BLOOD  Result Value Ref Range Status   Specimen Description BLOOD RIGHT THUMB  Final   Special Requests   Final    BOTTLES DRAWN AEROBIC AND  ANAEROBIC Blood Culture adequate volume   Culture   Final    NO GROWTH 5 DAYS Performed at [facility], [place] [address] [place], [place] [address]    Report Status 10/19/2020 FINAL  Final  Culture, blood (routine x 2)     Status: None   Collection Time: 10/13/20 10:43 PM   Specimen: BLOOD RIGHT HAND  Result Value Ref Range Status   Specimen Description BLOOD RIGHT HAND  Final   Special Requests   Final    BOTTLES DRAWN AEROBIC AND ANAEROBIC Blood Culture adequate volume   Culture   Final    NO GROWTH 5 DAYS Performed at [facility], [place] [address] [place], [place] [address]    Report Status 10/19/2020 FINAL  Final  Aerobic/Anaerobic Culture w Gram Stain (surgical/deep wound)     Status: None   Collection Time: 10/14/20 10:38 AM   Specimen: Soft Tissue, Other  Result Value Ref Range Status   Specimen Description TISSUE MITRAL VALVE VEGETATION SPEC A  Final   Special Requests MITRAL VALVE VEGETATION  Final   Gram Stain   Final    WBC PRESENT,BOTH PMN AND MONONUCLEAR NO ORGANISMS SEEN    Culture   Final    No growth aerobically or anaerobically. Performed at [facility], [place] [address] [place], [place] [address]    Report Status 10/19/2020 FINAL  Final  Aerobic/Anaerobic  Culture w Gram Stain (surgical/deep wound)     Status: None   Collection Time: 10/20/20  4:12 PM   Specimen: Soft Tissue, Other  Result Value Ref Range Status   Specimen Description TISSUE  Final   Special Requests RIGHT LEG ABSC SPEC A  Final   Gram Stain   Final    RARE WBC PRESENT,BOTH PMN AND MONONUCLEAR NO ORGANISMS SEEN    Culture   Final    FEW METHICILLIN RESISTANT STAPHYLOCOCCUS AUREUS CRITICAL RESULT CALLED TO, READ BACK BY AND VERIFIED WITH: RN N.[email or web address] AT [id number] ON 10/22/2020 BY T.SAAD NO ANAEROBES ISOLATED Performed at [facility], [place] [address], [place], [place] [address]    Report Status 10/26/2020 FINAL  Final   Organism ID, Bacteria METHICILLIN RESISTANT  STAPHYLOCOCCUS AUREUS  Final      Susceptibility   Methicillin resistant staphylococcus aureus - MIC*    CIPROFLOXACIN >=8 RESISTANT Resistant     ERYTHROMYCIN >=8 RESISTANT Resistant     GENTAMICIN <=0.5 SENSITIVE Sensitive     OXACILLIN >=4 RESISTANT Resistant     TETRACYCLINE <=1 SENSITIVE Sensitive     VANCOMYCIN 1 SENSITIVE Sensitive     TRIMETH/SULFA <=10 SENSITIVE Sensitive     CLINDAMYCIN <=0.25 SENSITIVE Sensitive     RIFAMPIN <=0.5 SENSITIVE Sensitive     Inducible Clindamycin NEGATIVE Sensitive     * FEW METHICILLIN RESISTANT STAPHYLOCOCCUS AUREUS  Fungus Stain     Status: None   Collection Time: 10/20/20  4:12 PM   Specimen: Soft Tissue, Other  Result Value Ref Range Status   FUNGUS STAIN Final report  Final    Comment: (NOTE) Performed At: [facility] [address] [place], [place] [id number] Rush Farmer MD [id number]    Fungal Source TISSUE  Final    Comment: RIGHT LEG ABSC SPEC A Performed at [facility], [place] [address], [place], [place] [address]   Fungal Stain reflex     Status: None   Collection Time: 10/20/20  4:12 PM  Result Value Ref Range Status   Fungal stain result 1 Comment  Final    Comment: (NOTE) KOH/Calcofluor preparation:  no fungus observed. Performed At: [facility] [place], [place] [id number] Rush Farmer MD [id number]     Studies/Results: No results found.    Assessment/Plan:  INTERVAL HISTORY:  Is now status post above-the-knee amputation  Principal Problem:   Bacterial endocarditis Active Problems:   Chronic combined systolic and diastolic congestive heart failure (HCC)   HTN (hypertension)   Anemia in chronic kidney disease   Hepatitis C   ESRD on dialysis ([place])   End stage renal disease (HCC)   Chest pain   Sepsis ([place])   Pneumonia due to infectious agent   ADPKD (autosomal dominant polycystic kidney disease)   Sepsis due to pneumonia (HCC)   Lactic acidosis    Cerebral embolism with cerebral infarction   Bacteremia due to Staphylococcus aureus   Mitral regurgitation   Uncontrolled hypertension   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  He had extraction of multiple teeth by oral surgery.  On 10 March she went to the OR and underwent mitral valve replacement and closure of the PFO.  In the interim he was found to have large " bumps" on his right leg which he had not mentioned until after surgery.  He turned out to have large complicated abscesses with osteomyelitis involving his tibia with a 7 x 3 cm area of bone that was excised.  The patient needs an above-the-knee amputation but is still not consented to going forward with surgery.  He has been told by multiple providers multiple times that he needs an above-the-knee amputation to avoid seeding his new valve his graft and potentially dying from staph aureus bacteremia and sepsis.  I again reiterated that if he wanted to live a longer life he would need to undergo AKA and that I would make that choice If it were me.  Fortunately later agreed for surgery and is now had that today.  He should continue on cefazolin through 20 April.  Made him a hospital follow-up with me   Patrick Anthony has an appointment on 11/11/2020 at 10 AM with Dr. Tommy Medal  The [facility] for Infectious Disease is  located in the [facility] at  [place] in [place].  [address], which is located to the left of the elevators.  Phone: [phone]  Fax: [phone]  [email or web address]  He should arrive 15 minutes prior to his appointment.  We will sign off for now please call with further questions.   LOS: 32 days   Patrick Anthony 10/29/2020, 4:57 PM

## 2020-10-29 NOTE — Progress Notes (Signed)
Patient c/o pain 10/10 MD on call notified, new order received. Advised to give one dose of Dilaudid and the PO dilaudid in between. We'll continue to monitor.

## 2020-10-29 NOTE — Progress Notes (Signed)
[facility] Progress Note  Subjective: no new c/o, seen in room, going to surgery later today. No sob or n/v  Vitals:   10/29/20 1530 10/29/20 1545 10/29/20 1600 10/29/20 1615  BP: 124/84 117/76 115/76 110/85  Pulse: 80 79 81 79  Resp: '16 17 20 16  '$ Temp:    98.1 F (36.7 C)  TempSrc:      SpO2: 100% 99% 100% 100%  Weight:      Height:        Exam:  alert, nad   no jvd  Chest cta bilat  Cor reg no RG  Abd soft ntnd no ascites   Ext no LE edema, R lower leg wrapped   Alert, NF, ox3   LUE AVG +bruit     OP HD: [facility]WF [facility]    4h 350/500 new edw here 73- 75kg (was82.5kg)2/2.5 bath LUA AVGHep 5000 -Aranesp 71mgIVq 2wks - last 09/27/20 (Verified order) -Hectorol373m IV qHD    Assessment/ Plan: 1. SP min invasive MVR(bioprosthetic)replacement - on 3/10. Doing well from cardiac standpoint 2. Anterior shin osteomyelitis: status post I&D per orthopedics. Pathology showing staph aureus in the tibia and ortho taking to OR today for R AKA.  3. HTN/volume - continues on clonidine patch and metoprolol 25 bid. New lower dry wt here about 73- 75kg.  4. 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 Underwent tooth extraction3/8/22.PerID to get 6 wks IV vanc w/ end date of 11/25/20. 5. ESRD - usual HDMWF.HD tomorrow off schedule due to surgery today.  6. Anemiaof CKD-HGB 9- 10 range. S/P 2 units of PRBCs 3/10.Darbe 100ug given here on 3/16, then lowered to 60ug weekly, next dose due 3/25. No IV Fe due to osteomyelitis.  7. Secondary Hyperparathyroidism -Calcium and phosphorus controlled. Continue sensipar, binders, and hectorol 8. Hx Hep C /liver cirrhosis.       Patrick Anthony 10/29/2020, 4:29 PM   Recent Labs  Lab 10/28/20 0152 10/29/20 0138  K 3.9 4.1  BUN 22* 38*  CREATININE 4.75* 7.11*  CALCIUM 8.5* 8.6*  PHOS 3.2 4.7*  HGB 10.0* 8.5*   Inpatient medications: . [MAR Hold]  (feeding supplement) PROSource Plus  30 mL Oral BID BM  . [MAR Hold] aspirin EC  325 mg Oral Daily  . [MAR Hold] calcium acetate  667 mg Oral TID WC  . [MAR Hold] Chlorhexidine Gluconate Cloth  6 each Topical Q0600  . [START ON 10/30/2020] cinacalcet  60 mg Oral Q breakfast  . [MAR Hold] cloNIDine  0.3 mg Transdermal Weekly  . [MAR Hold] darbepoetin (ARANESP) injection - DIALYSIS  60 mcg Intravenous Q Fri-HD  . [MAR Hold] enoxaparin (LOVENOX) injection  30 mg Subcutaneous Q48H  . [MAR Hold] feeding supplement (NEPRO CARB STEADY)  237 mL Oral TID BM  . [MAR Hold] gabapentin  200 mg Oral QHS  . influenza vac split quadrivalent PF  0.5 mL Intramuscular Tomorrow-1000  . [MAR Hold] metoprolol tartrate  50 mg Oral BID  . [MAR Hold] multivitamin  1 tablet Oral QHS  . [MAR Hold] oxymetazoline  1 spray Each Nare BID  . [MAR Hold] pantoprazole  40 mg Oral Daily  . [MAR Hold] senna-docusate  1 tablet Oral BID  . [MAR Hold] sodium chloride flush  3 mL Intravenous Q12H   . [MAR Hold] sodium chloride    . [MAR Hold] sodium chloride    . [MAR Hold] sodium chloride    . sodium chloride 10 mL/hr at  10/29/20 1351  .  ceFAZolin (ANCEF) IV    . lactated ringers    . methocarbamol (ROBAXIN) IV    . [MAR Hold] vancomycin 750 mg (10/27/20 2116)   [MAR Hold] sodium chloride, [MAR Hold] sodium chloride, [MAR Hold] sodium chloride, [MAR Hold] acetaminophen, [MAR Hold] camphor-menthol, [MAR Hold] diphenhydrAMINE, [MAR Hold] HYDROmorphone, methocarbamol **OR** methocarbamol (ROBAXIN) IV, [MAR Hold] ondansetron **OR** [MAR Hold] ondansetron (ZOFRAN) IV, [MAR Hold] oxyCODONE, oxyCODONE, [MAR Hold] pentafluoroprop-tetrafluoroeth, [MAR Hold] sodium chloride flush

## 2020-10-29 NOTE — Anesthesia Postprocedure Evaluation (Signed)
Anesthesia Post Note  Patient: Patrick Anthony  Procedure(s) Performed: RIGHT ABOVE KNEE AMPUTATION (Right Knee)     Patient location during evaluation: PACU Anesthesia Type: General Level of consciousness: awake and alert, patient cooperative and oriented Pain management: pain level controlled Vital Signs Assessment: post-procedure vital signs reviewed and stable Respiratory status: spontaneous breathing, nonlabored ventilation and respiratory function stable Cardiovascular status: blood pressure returned to baseline and stable Postop Assessment: no apparent nausea or vomiting Anesthetic complications: no   No complications documented.  Last Vitals:  Vitals:   10/29/20 1530 10/29/20 1545  BP: 124/84 117/76  Pulse: 80 79  Resp: 16 17  Temp:    SpO2: 100% 99%    Last Pain:  Vitals:   10/29/20 1530  TempSrc:   PainSc: Asleep                 Karalee Hauter,E. Aysiah Jurado

## 2020-10-29 NOTE — Anesthesia Preprocedure Evaluation (Addendum)
Anesthesia Evaluation  Patient identified by MRN, date of birth, ID band Patient awake    Reviewed: Allergy & Precautions, NPO status , Patient's Chart, lab work & pertinent test results, reviewed documented beta blocker date and time   History of Anesthesia Complications Negative for: history of anesthetic complications  Airway Mallampati: II  TM Distance: >3 FB Neck ROM: Full    Dental  (+) Poor Dentition, Dental Advisory Given   Pulmonary sleep apnea , former smoker,  RUL mass   breath sounds clear to auscultation       Cardiovascular hypertension, Pt. on medications and Pt. 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)  Rhythm:Regular Rate:Normal  10/07/2020 cath: clean coronary arteries, mildly elevated LVEDP    Neuro/Psych PSYCHIATRIC DISORDERS CVA (embolic)    GI/Hepatic negative GI ROS, (+) Cirrhosis     substance abuse  alcohol use, Hepatitis -  Endo/Other  negative endocrine ROS  Renal/GU ESRF and DialysisRenal disease (K+ 4.1)     Musculoskeletal   Abdominal   Peds  Hematology  (+) Blood dyscrasia (Hb 8.5), anemia ,   Anesthesia Other Findings   Reproductive/Obstetrics                            Anesthesia Physical Anesthesia Plan  ASA: III  Anesthesia Plan: General   Post-op Pain Management:    Induction: Intravenous  PONV Risk Score and Plan: 2 and Ondansetron, Dexamethasone and Treatment may vary due to age or medical condition  Airway Management Planned: LMA  Additional Equipment: None  Intra-op Plan:   Post-operative Plan:   Informed Consent: I have reviewed the patients History and Physical, chart, labs and discussed the procedure including the risks, benefits and alternatives for the proposed anesthesia with the patient or authorized representative who has indicated his/her understanding and acceptance.      Dental advisory given  Plan Discussed with: CRNA and Surgeon  Anesthesia Plan Comments:         Anesthesia Quick Evaluation

## 2020-10-30 ENCOUNTER — Encounter (HOSPITAL_COMMUNITY): Payer: Self-pay | Admitting: Orthopedic Surgery

## 2020-10-30 DIAGNOSIS — N2581 Secondary hyperparathyroidism of renal origin: Secondary | ICD-10-CM | POA: Diagnosis not present

## 2020-10-30 DIAGNOSIS — R0602 Shortness of breath: Secondary | ICD-10-CM | POA: Diagnosis not present

## 2020-10-30 DIAGNOSIS — I639 Cerebral infarction, unspecified: Secondary | ICD-10-CM | POA: Diagnosis not present

## 2020-10-30 DIAGNOSIS — Z992 Dependence on renal dialysis: Secondary | ICD-10-CM | POA: Diagnosis not present

## 2020-10-30 DIAGNOSIS — N186 End stage renal disease: Secondary | ICD-10-CM | POA: Diagnosis not present

## 2020-10-30 DIAGNOSIS — I132 Hypertensive heart and chronic kidney disease with heart failure and with stage 5 chronic kidney disease, or end stage renal disease: Secondary | ICD-10-CM | POA: Diagnosis not present

## 2020-10-30 DIAGNOSIS — D631 Anemia in chronic kidney disease: Secondary | ICD-10-CM | POA: Diagnosis not present

## 2020-10-30 DIAGNOSIS — J189 Pneumonia, unspecified organism: Secondary | ICD-10-CM | POA: Diagnosis not present

## 2020-10-30 DIAGNOSIS — R531 Weakness: Secondary | ICD-10-CM | POA: Diagnosis not present

## 2020-10-30 DIAGNOSIS — I5032 Chronic diastolic (congestive) heart failure: Secondary | ICD-10-CM | POA: Diagnosis not present

## 2020-10-30 DIAGNOSIS — A419 Sepsis, unspecified organism: Secondary | ICD-10-CM | POA: Diagnosis not present

## 2020-10-30 DIAGNOSIS — I330 Acute and subacute infective endocarditis: Secondary | ICD-10-CM | POA: Diagnosis not present

## 2020-10-30 LAB — RENAL FUNCTION PANEL
Albumin: 2.4 g/dL — ABNORMAL LOW (ref 3.5–5.0)
Anion gap: 13 (ref 5–15)
BUN: 51 mg/dL — ABNORMAL HIGH (ref 6–20)
CO2: 22 mmol/L (ref 22–32)
Calcium: 8.4 mg/dL — ABNORMAL LOW (ref 8.9–10.3)
Chloride: 93 mmol/L — ABNORMAL LOW (ref 98–111)
Creatinine, Ser: 8.77 mg/dL — ABNORMAL HIGH (ref 0.61–1.24)
GFR, Estimated: 7 mL/min — ABNORMAL LOW (ref >60)
Glucose, Bld: 98 mg/dL (ref 70–99)
Phosphorus: 5.3 mg/dL — ABNORMAL HIGH (ref 2.5–4.6)
Potassium: 6.4 mmol/L (ref 3.5–5.1)
Sodium: 128 mmol/L — ABNORMAL LOW (ref 135–145)

## 2020-10-30 LAB — CBC WITH DIFFERENTIAL/PLATELET
Abs Immature Granulocytes: 0.19 10*3/uL — ABNORMAL HIGH (ref 0.00–0.07)
Basophils Absolute: 0 10*3/uL (ref 0.0–0.1)
Basophils Relative: 0 %
Eosinophils Absolute: 0 10*3/uL (ref 0.0–0.5)
Eosinophils Relative: 0 %
HCT: 26.3 % — ABNORMAL LOW (ref 39.0–52.0)
Hemoglobin: 8.1 g/dL — ABNORMAL LOW (ref 13.0–17.0)
Immature Granulocytes: 1 %
Lymphocytes Relative: 3 %
Lymphs Abs: 0.7 10*3/uL (ref 0.7–4.0)
MCH: 27.8 pg (ref 26.0–34.0)
MCHC: 30.8 g/dL (ref 30.0–36.0)
MCV: 90.4 fL (ref 80.0–100.0)
Monocytes Absolute: 1.6 10*3/uL — ABNORMAL HIGH (ref 0.1–1.0)
Monocytes Relative: 7 %
Neutro Abs: 19.2 10*3/uL — ABNORMAL HIGH (ref 1.7–7.7)
Neutrophils Relative %: 89 %
Platelets: 440 10*3/uL — ABNORMAL HIGH (ref 150–400)
RBC: 2.91 MIL/uL — ABNORMAL LOW (ref 4.22–5.81)
RDW: 17.2 % — ABNORMAL HIGH (ref 11.5–15.5)
WBC: 21.8 10*3/uL — ABNORMAL HIGH (ref 4.0–10.5)
nRBC: 0 % (ref 0.0–0.2)

## 2020-10-30 LAB — POTASSIUM
Potassium: 5.2 mmol/L — ABNORMAL HIGH (ref 3.5–5.1)
Potassium: 3.6 mmol/L (ref 3.5–5.1)
Potassium: 4.2 mmol/L (ref 3.5–5.1)
Potassium: 4.1 mmol/L (ref 3.5–5.1)

## 2020-10-30 MED ORDER — HYDROMORPHONE HCL 1 MG/ML IJ SOLN
INTRAMUSCULAR | Status: AC
  Administered 2020-10-30: 1 mg via INTRAVENOUS
  Filled 2020-10-30: qty 1

## 2020-10-30 MED ORDER — VANCOMYCIN HCL IN DEXTROSE 750-5 MG/150ML-% IV SOLN: 750.0000 mg | Freq: Once | INTRAVENOUS | Status: AC

## 2020-10-30 MED ORDER — DARBEPOETIN ALFA 60 MCG/0.3ML IJ SOSY
60.0000 ug | PREFILLED_SYRINGE | INTRAMUSCULAR | Status: DC
  Administered 2020-11-01: 60 ug via INTRAVENOUS

## 2020-10-30 MED ORDER — SODIUM ZIRCONIUM CYCLOSILICATE 10 G PO PACK
10.0000 g | PACK | Freq: Once | ORAL | Status: AC
  Administered 2020-10-30: 10 g via ORAL
  Filled 2020-10-30: qty 1

## 2020-10-30 MED ORDER — KETOROLAC TROMETHAMINE 15 MG/ML IJ SOLN
7.5000 mg | Freq: Two times a day (BID) | INTRAMUSCULAR | Status: DC
  Administered 2020-10-30 – 2020-10-31 (×2): 7.5 mg via INTRAVENOUS
  Filled 2020-10-30 (×3): qty 1

## 2020-10-30 MED ORDER — GABAPENTIN 300 MG PO CAPS
300.0000 mg | ORAL_CAPSULE | Freq: Every day | ORAL | Status: DC
  Administered 2020-10-30 – 2020-11-03 (×5): 300 mg via ORAL
  Filled 2020-10-30 (×6): qty 1

## 2020-10-30 MED ORDER — VANCOMYCIN HCL IN DEXTROSE 750-5 MG/150ML-% IV SOLN
INTRAVENOUS | Status: AC
  Administered 2020-10-30: 750 mg via INTRAVENOUS
  Filled 2020-10-30: qty 150

## 2020-10-30 MED ORDER — GABAPENTIN 300 MG PO CAPS: 300.0000 mg | ORAL_CAPSULE | Freq: Three times a day (TID) | ORAL | Status: DC

## 2020-10-30 MED ORDER — HYDROMORPHONE HCL 1 MG/ML IJ SOLN
0.5000 mg | INTRAMUSCULAR | Status: DC | PRN
  Administered 2020-10-30 – 2020-11-01 (×8): 1 mg via INTRAVENOUS
  Administered 2020-11-01: 0.5 mg via INTRAVENOUS
  Administered 2020-11-01 – 2020-11-02 (×3): 1 mg via INTRAVENOUS
  Filled 2020-10-30 (×12): qty 1

## 2020-10-30 MED ORDER — OXYCODONE HCL 5 MG PO TABS
ORAL_TABLET | ORAL | Status: AC
  Filled 2020-10-30: qty 3

## 2020-10-30 MED ORDER — KETOROLAC TROMETHAMINE 15 MG/ML IJ SOLN
INTRAMUSCULAR | Status: AC
  Administered 2020-10-30: 7.5 mg via INTRAVENOUS
  Filled 2020-10-30: qty 1

## 2020-10-30 MED ORDER — HYDROMORPHONE HCL 1 MG/ML IJ SOLN
1.0000 mg | INTRAMUSCULAR | Status: AC | PRN
Start: 2020-10-30 — End: 2020-10-30
  Administered 2020-10-30: 1 mg via INTRAVENOUS
  Filled 2020-10-30: qty 1

## 2020-10-30 MED ORDER — SODIUM ZIRCONIUM CYCLOSILICATE 10 G PO PACK: 10.0000 g | PACK | Freq: Once | ORAL | Status: DC

## 2020-10-30 NOTE — Progress Notes (Signed)
Pharmacy Antibiotic Note  Patrick Anthony is a 50 y.o. male admitted on 09/27/2020 with fever, chills, and generalized weakness.  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  Continues on Vancomycin per pharmacy.  Pt has ESRD with HD MWF. Plans are for 6 weeks of antibiotics with day 1 being post-op day 1 from valve surgery per 3/7 ID note  3/10 > (4/21). 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   Patient did not receive dialysis on Friday, 3/25 due to surgery - bumped to 3/26.  Will adjust vancomycin so patient receives today with dialysis.  Plan: -Vancomycin to 750 mg IV qHD (one time dose for 3/26) -Continue following HD schedule/duration -F/u Pre-HD VR at steady-state  Height: 6' 0.01" (182.9 cm) Weight: 80.6 kg (177 lb 11.2 oz) IBW/kg (Calculated) : 77.62  Temp (24hrs), Avg:98.3 F (36.8 C), Min:97.9 F (36.6 C), Max:98.7 F (37.1 C)  Recent Labs  Lab 10/26/20 0138 10/27/20 0122 10/27/20 1748 10/28/20 0152 10/29/20 0138 10/30/20 0157  WBC 11.5* 14.2*  --  10.9* 10.0 21.8*  CREATININE 6.03* 7.90*  --  4.75* 7.11* 8.77*  VANCORANDOM  --   --  15  --   --   --     Estimated Creatinine Clearance: 11.2 mL/min (A) (by C-G formula based on SCr of 8.77 mg/dL (H)).    No Known Allergies  Antimicrobials this admission: Vanc 2/21>> Cefepime 2/21>>2/22 Flagyl 2/21>>2/22 (2 doses given)  Dose adjustments this admission: 2/28 Pre-HD VR 41 - estimate post HD level 27 Dose held 2/28 and reduced to '750mg'$  IV qHD starting 3/2 3/7 pre HD VR= 29; change vanc to '500mg'$  w/ HD 3/13 Post-HD VR 29 - no additional dose (did get additional dose of vanc around time of surgery) 3/16 Pre-HD VR 20 - continue with 500 IV qHD 3/17 Pre-HD VR 15 - incr to 750 IV qHD  Microbiology results: 2/21 blood x 2: MRSA, sens Vanc MIC < 0.5, Septra, TCN, gent MIC < 0.5, Clinda 2/22 BCID: MRSA  2/21 COVID and flu: negative 2/21 HIV: non-reactive 2/24 blood x 2: 1/2 SA 2/27 blood x 2:  neg  Thank you for involving pharmacy in this patient's care.  Dimple Nanas, PharmD PGY-1 Acute Care Pharmacy Resident Office: [phone] 10/30/2020 8:32 AM  **Pharmacist phone directory can be found on [email or web address] listed under [place]**

## 2020-10-30 NOTE — Progress Notes (Signed)
Mobility Specialist - Progress Note   10/30/20 1359  Mobility  Activity Refused mobility   Pt refused d/t wanting to eat his lunch. Will f/u as able.   Pricilla Handler Mobility Specialist Mobility Specialist Phone: [phone]

## 2020-10-30 NOTE — Progress Notes (Addendum)
[facility] Progress Note  Subjective: no new c/o, seen in HD. Having sig pain issues.   Vitals:   10/30/20 0813 10/30/20 0830 10/30/20 0900 10/30/20 0930  BP: (!) 165/100 (!) 155/124 (!) 124/94 (!) 139/92  Pulse: 91 91 91 91  Resp: '18 19 18 '$ (!) 23  Temp:      TempSrc:      SpO2: 98% 100% 100% 92%  Weight:      Height:        Exam:  alert, nad   no jvd  Chest cta bilat  Cor reg no RG  Abd soft ntnd no ascites   Ext no LE edema, R AKA wrapped   Alert, NF, ox3   LUE AVG +bruit     OP HD: MWF [facility]    4h 350/500 new edw here 73- 75kg (was82.5kg)2/2.5 bath LUA AVGHep 5000 -Aranesp 86mgIVq 2wks - last 09/27/20 (Verified order) -Hectorol387m IV qHD    Assessment/ Plan: 1. Anterior shin osteomyelitis: status post I&D per orthopedics. Pathology showed staph aureus in the tibia. SP R AKA per ortho 3/25 yesterday 2. SP min invasive MVR(bioprosthetic)replacement - on 3/10. Doing well from cardiac standpoint 3. 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 Underwent tooth extraction3/8/22.PerID to get 6 wks IV vanc w/ end date of 11/25/20. 4. ESRD - usual HDMWF.HD today off schedule, then Monday.   5. HTN/volume - continues on clonidine patch and metoprolol 25 bid. New lower dry wt here about 73- 75kg.  6. Anemiaof CKD-HGB 9- 10 range. S/P 2 units of PRBCs 3/10.Darbe 100ug given here on 3/16, then lowered to 60ug weekly, next dose due 3/28. No IV Fe due to osteomyelitis.  7. Secondary Hyperparathyroidism -Calcium and phosphorus controlled. Continue sensipar, binders, and hectorol 8. Hx Hep C /liver cirrhosis.       Patrick Anthony 10/30/2020, 9:48 AM   Recent Labs  Lab 10/29/20 0138 10/30/20 0157 10/30/20 0739  K 4.1 6.4* 5.2*  BUN 38* 51*  --   CREATININE 7.11* 8.77*  --   CALCIUM 8.6* 8.4*  --   PHOS 4.7* 5.3*  --   HGB 8.5* 8.1*  --    Inpatient medications: . (feeding  supplement) PROSource Plus  30 mL Oral BID BM  . aspirin EC  325 mg Oral Daily  . calcium acetate  667 mg Oral TID WC  . Chlorhexidine Gluconate Cloth  6 each Topical Q0600  . cinacalcet  60 mg Oral Q breakfast  . cloNIDine  0.3 mg Transdermal Weekly  . [START ON 11/01/2020] darbepoetin (ARANESP) injection - DIALYSIS  60 mcg Intravenous Q Mon-HD  . docusate sodium  100 mg Oral BID  . enoxaparin (LOVENOX) injection  30 mg Subcutaneous Q48H  . feeding supplement (NEPRO CARB STEADY)  237 mL Oral TID BM  . gabapentin  200 mg Oral QHS  . gabapentin  300 mg Oral TID  . influenza vac split quadrivalent PF  0.5 mL Intramuscular Tomorrow-1000  . ketorolac  7.5 mg Intravenous Q12H  . metoprolol tartrate  50 mg Oral BID  . multivitamin  1 tablet Oral QHS  . oxymetazoline  1 spray Each Nare BID  . pantoprazole  40 mg Oral Daily  . senna-docusate  1 tablet Oral BID  . sodium chloride flush  3 mL Intravenous Q12H   . sodium chloride    . sodium chloride    . sodium chloride    . sodium chloride 10 mL/hr at  10/29/20 1351  . sodium chloride    . lactated ringers    . methocarbamol (ROBAXIN) IV    . vancomycin 750 mg (10/27/20 2116)  . vancomycin     sodium chloride, sodium chloride, sodium chloride, acetaminophen, bisacodyl, camphor-menthol, diphenhydrAMINE, HYDROmorphone (DILAUDID) injection, HYDROmorphone (DILAUDID) injection, HYDROmorphone, magnesium citrate, methocarbamol **OR** methocarbamol (ROBAXIN) IV, metoCLOPramide **OR** metoCLOPramide (REGLAN) injection, ondansetron **OR** ondansetron (ZOFRAN) IV, oxyCODONE, oxyCODONE, pentafluoroprop-tetrafluoroeth, polyethylene glycol, sodium chloride flush

## 2020-10-30 NOTE — Progress Notes (Signed)
Patient ID: Patrick Anthony, male   DOB: Jan 29, 1971, 50 y.o.   MRN: [id number] Patient is postoperative day 1 above-the-knee amputation for extensive osteomyelitis of the tibia.  There is 50 cc in the wound VAC canister.  Patient complains of increased pain last night he is currently in dialysis.  We will see how patient does with the gabapentin and Toradol for pain control.

## 2020-10-30 NOTE — Progress Notes (Signed)
OT Cancellation Note  Patient Details Name: Patrick Anthony MRN: [id number] DOB: May 24, 1971   Cancelled Treatment:    Reason Eval/Treat Not Completed: Patient at procedure or test/ unavailable.  Patient off the floor for dialysis.  OT to continue efforts.    [name] D Jorge Retz 10/30/2020, 11:07 AM

## 2020-10-30 NOTE — Progress Notes (Signed)
PT Cancellation Note  Patient Details Name: Patrick Anthony MRN: [id number] DOB: November 01, 1970   Cancelled Treatment:    Reason Eval/Treat Not Completed: Patient at procedure or test/unavailable.  In HD and will retry as time and pt allow.   Ramond Dial 10/30/2020, 9:54 AM Mee Hives, PT MS Acute Rehab Dept. Number: [place] and [place]

## 2020-10-30 NOTE — Progress Notes (Signed)
PROGRESS NOTE    Patrick Anthony  [id number] DOB: [birth year] DOA: 09/27/2020 PCP: [name], MD   Chief Complaint  Patient presents with  . 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  Seen by ID managed with IV antibiotic, CT surgery consult salt was obtained subsequently underwent minimally invasive mitral valve replacement on 10/14/2020 and was managed under CT surgery service and subsequently transferred to [facility] on 3/16.  Patient was followed by ID, orthopedics.  He was found to have swelling on the right leg and seen by orthopedic underwent debridement on the right leg and treated for large complicated abscess with osteomyelitis involving his tibia with 7 by X.3 centimeter area of bone that was excised. Patient was seen by Dr. Sharol Given orthopedic ID and the right above-the-knee amputation has been recommended moving forward to manage this infection. Patient has been told by multiple providers multiple times the need for the above-the-knee amputation to avoid seeding of his new valve and his graft but has been reluctant and he is aware about dying from staph aureus bacteremia and sepsis. Patient finally agreed for AKA on 10/29/20 and performed by Dr Sharol Given  Subjective:  Overnight patient had hyperkalemia event received Lokelma.  WBC has bumped up - is status post right AKA. He has had no fever. Patient is complaining of pain at the stump site.  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 Sharol Given, vascular,CT surgery. Repeat blood cultures from 10/13/20 are negative. Patient is status post right TKA 10/29/20 Continue pain management with  Scheduled Toradol oxycodone Dilaudid, if remains uncontrolled could consider PCA pump.  Increase Neurontin  ESRD and HD MWF: on dialysis this morning.   Hyperkalemia: Status post Lokelma, follow-up repeat K this morning 5.2, on HD. Recent Labs  Lab 10/27/20 0122 10/28/20 0152 10/29/20 0138 10/30/20 0157 10/30/20 0739  K 4.0 3.9 4.1 6.4* 5.2*   Secondary hyperparathyroidism-calcium and phosphorus stable continue Sensipar binders and Hectorol per nephrology.  Monitor lab  Anemia of chronic kidney disease: Hemoglobin fluctuating 8 to 10 g.  Monitor.Continue darbepoetin No IV iron due to osteomyelitis.  Status post 2 unit PRBC so far. Recent Labs  Lab 10/26/20 0138 10/27/20 0122 10/28/20 0152 10/29/20 0138 10/30/20 0157  HGB 9.7* 8.9* 10.0* 8.5* 8.1*  HCT 30.3* 27.4* 32.3* 27.7* 26.3*   History of hepatitis C/liver cirrhosis-outpatient follow-up with ID.Monitor LFTs intermittently.  He has very low albumin. Recent Labs  Lab 10/26/20 0138 10/27/20 0122 10/28/20 0152 10/29/20 0138 10/30/20 0157  ALBUMIN 2.1* 2.1* 2.2* 2.1* 2.4*   Hyponatremia level fluctuating -adjust w/ HD- Na at 218-132.  Hypertension well controlled on metoprolol and clonidine patch.  Nasal bleeding on 3/20 resolved.  Diet Order  Diet renal with fluid restriction Fluid restriction: 1200 mL Fluid; Room service appropriate? Yes; Fluid consistency: Thin  Diet effective now                  Nutrition Problem: Increased nutrient needs Etiology: chronic illness (ESRD on HD) Signs/Symptoms: estimated needs Interventions: Nepro shake,Refer to RD note for recommendations Patient's Body mass index is 21.97 kg/m.  DVT prophylaxis: SCDs Start: 10/29/20 1639 SCDs Start: 10/20/20 1721 enoxaparin (LOVENOX) injection 30 mg Start: 10/19/20 0800 SCDs Start: 10/15/20 0610 Code Status:   Code Status: Full Code  Family Communication:plan of care discussed with patient at bedside. I had discussed with [name] extensively 3/23  Status [id number] Remains inpatient appropriate because:IV treatments appropriate due to intensity of illness or inability to take PO and Inpatient level of care appropriate due to severity of illness  Dispo:The patient is from: Home            Anticipated d/c is to: Home likely will need CIR post AKA. PTOT eval pending            Patient currently is not medically stable to d/c.            Difficult to place patient No  Unresulted Labs (From admission, onward)          Start     Ordered   10/30/20 0800  Potassium  Now then every 4 hours,   STAT (with TIMED occurrences)     Comments: Until normal twice.   Question:  Specimen collection method  Answer:  Lab=Lab collect   10/30/20 0450   10/21/20 0500  Renal function panel  Daily,   R     Question:  Specimen collection method  Answer:  Lab=Lab collect   10/20/20 2128   10/21/20 0500  CBC with Differential/Platelet  Daily,   R     Question:  Specimen collection method  Answer:  Lab=Lab collect   10/20/20 2128   Signed and Held  Renal function panel  Once,   R       Question:  Specimen collection method  Answer:  Lab=Lab collect   Signed and Held         Medications reviewed:  Scheduled Meds: . (feeding supplement) PROSource Plus  30 mL Oral BID BM  . aspirin EC  325 mg Oral Daily  . calcium acetate  667 mg Oral TID WC  . Chlorhexidine Gluconate Cloth  6 each Topical Q0600  . cinacalcet   60 mg Oral Q breakfast  . cloNIDine  0.3 mg Transdermal Weekly  . [START ON 11/01/2020] darbepoetin (ARANESP) injection - DIALYSIS  60 mcg Intravenous Q Mon-HD  . docusate sodium  100 mg Oral BID  . enoxaparin (LOVENOX) injection  30 mg Subcutaneous Q48H  . feeding supplement (NEPRO CARB STEADY)  237 mL Oral TID BM  . gabapentin  300 mg Oral QHS  . influenza vac split quadrivalent PF  0.5 mL Intramuscular Tomorrow-1000  . ketorolac  7.5 mg Intravenous Q12H  . metoprolol tartrate  50 mg Oral BID  . multivitamin  1 tablet Oral QHS  . oxymetazoline  1 spray Each Nare BID  . pantoprazole  40 mg Oral Daily  . senna-docusate  1 tablet Oral BID  . sodium chloride flush  3 mL Intravenous Q12H   Continuous Infusions: . sodium chloride    . sodium chloride    . sodium chloride    . sodium chloride 10 mL/hr at  10/29/20 1351  . sodium chloride    . lactated ringers    . methocarbamol (ROBAXIN) IV    . vancomycin 750 mg (10/27/20 2116)  . vancomycin 750 mg (10/30/20 1113)    Consultants:see note  Procedures:see note  Antimicrobials: Anti-infectives (From admission, onward)   Start     Dose/Rate Route Frequency Ordered Stop   10/30/20 1100  vancomycin (VANCOCIN) IVPB 750 mg/150 ml premix        750 mg 150 mL/hr over 60 Minutes Intravenous Once in dialysis 10/30/20 0833     10/29/20 1000  ceFAZolin (ANCEF) IVPB 2g/100 mL premix  Status:  Discontinued        2 g 200 mL/hr over 30 Minutes Intravenous To Short Stay 10/29/20 0730 10/29/20 1634   10/27/20 2015  vancomycin (VANCOCIN) IVPB 750 mg/150 ml premix        750 mg 150 mL/hr over 60 Minutes Intravenous Every M-W-F (Hemodialysis) 10/27/20 2005     10/25/20 0922  vancomycin (VANCOCIN) 500-5 MG/100ML-% IVPB       Note to Pharmacy: Kalman Shan   : cabinet override      10/25/20 0922 10/25/20 0958   10/21/20 0600  ceFAZolin (ANCEF) IVPB 2g/100 mL premix        2 g 200 mL/hr over 30 Minutes Intravenous On call to O.R. 10/20/20 1349  10/21/20 0650   10/20/20 2243  vancomycin (VANCOCIN) 500-5 MG/100ML-% IVPB       Note to Pharmacy: Wallace Cullens   : cabinet override      10/20/20 2243 10/20/20 2243   10/20/20 1437  ceFAZolin (ANCEF) 2-4 GM/100ML-% IVPB       Note to Pharmacy: Ladoris Gene   : cabinet override      10/20/20 1437 10/20/20 1612   10/20/20 1200  vancomycin (VANCOREADY) IVPB 500 mg/100 mL  Status:  Discontinued        500 mg 100 mL/hr over 60 Minutes Intravenous Every M-W-F (Hemodialysis) 10/20/20 0903 10/27/20 2005   10/18/20 1124  vancomycin variable dose per unstable renal function (pharmacist dosing)  Status:  Discontinued         Does not apply See admin instructions 10/18/20 1124 10/20/20 0903   10/16/20 1600  vancomycin (VANCOREADY) IVPB 500 mg/100 mL  Status:  Discontinued        500 mg 100 mL/hr over 60 Minutes Intravenous Every Sat (Hemodialysis) 10/16/20 1306 10/16/20 1312   10/15/20 1500  vancomycin (VANCOCIN) IVPB 500 mg/100 ml premix  Status:  Discontinued        500 mg 100 mL/hr over 60 Minutes Intravenous Every M-W-F (Hemodialysis) 10/15/20 1357 10/17/20 0942   10/14/20 2130  vancomycin (VANCOCIN) IVPB 1000 mg/200 mL premix  Status:  Discontinued        1,000 mg 200 mL/hr over 60 Minutes Intravenous  Once 10/14/20 1403 10/14/20 1547   10/14/20 2130  vancomycin (VANCOREADY) IVPB 500 mg/100 mL        500 mg 100 mL/hr over 60 Minutes Intravenous  Once 10/14/20 1547 10/14/20 2222   10/14/20 1730  cefUROXime (ZINACEF) 1.5 g in sodium chloride 0.9 % 100 mL IVPB        1.5 g 200 mL/hr over 30 Minutes Intravenous Every 24 hr x 2 10/14/20 1403 10/15/20 1718   10/14/20 1128  vancomycin (VANCOCIN) 1,000 mg in sodium chloride 0.9 % 1,000 mL irrigation  Status:  Discontinued          As needed 10/14/20 1128 10/14/20 1401  10/14/20 0400  vancomycin (VANCOREADY) IVPB 1250 mg/250 mL        1,250 mg 166.7 mL/hr over 90 Minutes Intravenous To Surgery 10/13/20 1039 10/14/20 0910   10/14/20 0400   cefUROXime (ZINACEF) 1.5 g in sodium chloride 0.9 % 100 mL IVPB        1.5 g 200 mL/hr over 30 Minutes Intravenous To Surgery 10/13/20 1039 10/14/20 0835   10/14/20 0400  cefUROXime (ZINACEF) 750 mg in sodium chloride 0.9 % 100 mL IVPB        750 mg 200 mL/hr over 30 Minutes Intravenous To Surgery 10/13/20 1039 10/14/20 1250   10/14/20 0400  vancomycin (VANCOCIN) 1,000 mg in sodium chloride 0.9 % 1,000 mL irrigation  Status:  Discontinued         Irrigation To Surgery 10/13/20 1039 10/14/20 1403   10/13/20 1657  vancomycin (VANCOCIN) 500-5 MG/100ML-% IVPB       Note to Pharmacy: Judieth Keens  : cabinet override      10/13/20 1657 10/13/20 1717   10/12/20 0600  ceFAZolin (ANCEF) IVPB 2g/100 mL premix        2 g 200 mL/hr over 30 Minutes Intravenous On call to O.R. 10/11/20 1841 10/12/20 1315   10/11/20 1415  vancomycin (VANCOCIN) IVPB 500 mg/100 ml premix  Status:  Discontinued        500 mg 100 mL/hr over 60 Minutes Intravenous Every M-W-F (Hemodialysis) 10/11/20 1413 10/14/20 1403   10/11/20 1413  vancomycin (VANCOCIN) 500-5 MG/100ML-% IVPB       Note to Pharmacy: Kalman Shan   : cabinet override      10/11/20 1413 10/12/20 0229   10/11/20 1353  Vancomycin (VANCOCIN) 750-5 MG/150ML-% IVPB  Status:  Discontinued       Note to Pharmacy: Kalman Shan   : cabinet override      10/11/20 1353 10/11/20 1417   10/08/20 1200  vancomycin (VANCOCIN) IVPB 750 mg/150 ml premix  Status:  Discontinued        750 mg 150 mL/hr over 60 Minutes Intravenous Every M-W-F (Hemodialysis) 10/05/20 1404 10/11/20 1413   10/06/20 1619  vancomycin (VANCOCIN) 500-5 MG/100ML-% IVPB       Note to Pharmacy: Judieth Keens  : cabinet override      10/06/20 1619 10/06/20 1736   10/06/20 1200  vancomycin (VANCOCIN) IVPB 750 mg/150 ml premix  Status:  Discontinued        750 mg 150 mL/hr over 60 Minutes Intravenous Every M-W-F (Hemodialysis) 10/04/20 0905 10/05/20 1404   10/06/20 1200  vancomycin (VANCOCIN)  IVPB 500 mg/100 ml premix        500 mg 100 mL/hr over 60 Minutes Intravenous Every Wed (Hemodialysis) 10/05/20 1404 10/06/20 2000   10/01/20 2000  DAPTOmycin (CUBICIN) 840 mg in sodium chloride 0.9 % IVPB  Status:  Discontinued        840 mg 233.6 mL/hr over 30 Minutes Intravenous Every 48 hours 10/01/20 0922 10/01/20 1056   10/01/20 1200  vancomycin (VANCOCIN) IVPB 1000 mg/200 mL premix  Status:  Discontinued        1,000 mg 200 mL/hr over 60 Minutes Intravenous Every M-W-F (Hemodialysis) 10/01/20 1056 10/04/20 0845   09/29/20 1200  vancomycin (VANCOCIN) IVPB 1000 mg/200 mL premix  Status:  Discontinued        1,000 mg 200 mL/hr over 60 Minutes Intravenous Every M-W-F (Hemodialysis) 09/27/20 1443 10/01/20 0922   09/29/20 1200  ceFEPIme (MAXIPIME) 2 g in sodium chloride 0.9 % 100  mL IVPB  Status:  Discontinued        2 g 200 mL/hr over 30 Minutes Intravenous Every M-W-F (Hemodialysis) 09/27/20 1443 09/28/20 1054   09/28/20 0900  metroNIDAZOLE (FLAGYL) IVPB 500 mg  Status:  Discontinued        500 mg 100 mL/hr over 60 Minutes Intravenous Every 12 hours 09/27/20 1808 09/28/20 1054   09/28/20 0900  ceFEPIme (MAXIPIME) 1 g in sodium chloride 0.9 % 100 mL IVPB  Status:  Discontinued        1 g 200 mL/hr over 30 Minutes Intravenous Every 12 hours 09/27/20 1808 09/27/20 1812   09/27/20 1445  vancomycin (VANCOREADY) IVPB 2000 mg/400 mL        2,000 mg 200 mL/hr over 120 Minutes Intravenous  Once 09/27/20 1433 09/27/20 2330   09/27/20 1430  ceFEPIme (MAXIPIME) 2 g in sodium chloride 0.9 % 100 mL IVPB        2 g 200 mL/hr over 30 Minutes Intravenous  Once 09/27/20 1418 09/27/20 1541   09/27/20 1430  metroNIDAZOLE (FLAGYL) IVPB 500 mg        500 mg 100 mL/hr over 60 Minutes Intravenous  Once 09/27/20 1418 09/27/20 2214   09/27/20 1430  vancomycin (VANCOREADY) IVPB 1750 mg/350 mL  Status:  Discontinued        1,750 mg 175 mL/hr over 120 Minutes Intravenous  Once 09/27/20 1418 09/27/20 1433      Culture/Microbiology    Component Value Date/Time   SDES TISSUE 10/20/2020 1612   SPECREQUEST RIGHT LEG ABSC SPEC A 10/20/2020 1612   CULT  10/20/2020 1612    FEW METHICILLIN RESISTANT STAPHYLOCOCCUS AUREUS CRITICAL RESULT CALLED TO, READ BACK BY AND VERIFIED WITH: RN N.CROSFON AT 0954 ON 10/22/2020 BY T.SAAD NO ANAEROBES ISOLATED Performed at [facility], [place] [address] [place], [place] [address]    REPTSTATUS 10/26/2020 FINAL 10/20/2020 1612    Other culture-see note  Objective: Vitals: Today's Vitals   10/30/20 0900 10/30/20 0930 10/30/20 1000 10/30/20 1030  BP: (!) 124/94 (!) 139/92 124/81 114/90  Pulse: 91 91 93 100  Resp: 18 (!) '23 13 14  '$ Temp:      TempSrc:      SpO2: 100% 92% 97% 97%  Weight:      Height:      PainSc: 10-Worst pain ever  7      Intake/Output Summary (Last 24 hours) at 10/30/2020 1115 Last data filed at 10/29/2020 2005 Gross per 24 hour  Intake 475 ml  Output 100 ml  Net 375 ml   Filed Weights   10/29/20 0401 10/30/20 0453 10/30/20 0800  Weight: 77.2 kg 80.6 kg 73.5 kg   Weight change: 3.402 kg  Intake/Output from previous day: 03/25 0701 - 03/26 0700 In: 475 [I.V.:300; IV Piggyback:175] Out: 100 [Blood:100] Intake/Output this shift: No intake/output data recorded. Filed Weights   10/29/20 0401 10/30/20 0453 10/30/20 0800  Weight: 77.2 kg 80.6 kg 73.5 kg   Examination: General exam: AAOx3,NAD, weak appearing. HEENT:Oral mucosa moist, Ear/Nose WNL grossly, dentition normal. Respiratory system: bilaterally clear,no wheezing or crackles,no use of accessory muscle. Cardiovascular system: S1 & S2 +, No JVD. Gastrointestinal system: Abdomen soft, NT,ND, BS+. Nervous System:Alert, awake, moving extremities and grossly non-focal. Extremities:Rt AKA stump in vac/dressing,left distal peripheral pulses palpable.  Skin: No rashes,no icterus. MSK: Normal muscle bulk,tone, power.  Data Reviewed: I have personally reviewed following  labs and imaging studies CBC: Recent Labs  Lab 10/26/20 0138 10/27/20  0122 10/28/20 0152 10/29/20 0138 10/30/20 0157  WBC 11.5* 14.2* 10.9* 10.0 21.8*  NEUTROABS 8.2* 10.6* 8.0* 6.8 19.2*  HGB 9.7* 8.9* 10.0* 8.5* 8.1*  HCT 30.3* 27.4* 32.3* 27.7* 26.3*  MCV 89.6 89.5 90.0 91.1 90.4  PLT 435* 451* 415* 389 [id number]*   Basic Metabolic Panel: Recent Labs  Lab 10/26/20 0138 10/27/20 0122 10/28/20 0152 10/29/20 0138 10/30/20 0157 10/30/20 0739  NA 132* 129* 132* 130* 128*  --   K 3.6 4.0 3.9 4.1 6.4* 5.2*  CL 93* 90* 95* 93* 93*  --   CO2 '28 26 26 25 22  '$ --   GLUCOSE 116* 108* 115* 111* 98  --   BUN 34* 50* 22* 38* 51*  --   CREATININE 6.03* 7.90* 4.75* 7.11* 8.77*  --   CALCIUM 8.7* 8.5* 8.5* 8.6* 8.4*  --   PHOS 4.7* 5.4* 3.2 4.7* 5.3*  --    GFR: Estimated Creatinine Clearance: 10.6 mL/min (A) (by C-G formula based on SCr of 8.77 mg/dL (H)). Liver Function Tests: Recent Labs  Lab 10/26/20 0138 10/27/20 0122 10/28/20 0152 10/29/20 0138 10/30/20 0157  ALBUMIN 2.1* 2.1* 2.2* 2.1* 2.4*   No results for input(s): LIPASE, AMYLASE in the last 168 hours. No results for input(s): AMMONIA in the last 168 hours. Coagulation Profile: No results for input(s): INR, PROTIME in the last 168 hours. Cardiac Enzymes: No results for input(s): CKTOTAL, CKMB, CKMBINDEX, TROPONINI in the last 168 hours. BNP (last 3 results) No results for input(s): PROBNP in the last 8760 hours. HbA1C: No results for input(s): HGBA1C in the last 72 hours. CBG: No results for input(s): GLUCAP in the last 168 hours. Lipid Profile: No results for input(s): CHOL, HDL, LDLCALC, TRIG, CHOLHDL, LDLDIRECT in the last 72 hours. Thyroid Function Tests: No results for input(s): TSH, T4TOTAL, FREET4, T3FREE, THYROIDAB in the last 72 hours. Anemia Panel: No results for input(s): VITAMINB12, FOLATE, FERRITIN, TIBC, IRON, RETICCTPCT in the last 72 hours. Sepsis Labs: No results for input(s): PROCALCITON,  LATICACIDVEN in the last 168 hours.  Recent Results (from the past 240 hour(s))  Aerobic/Anaerobic Culture w Gram Stain (surgical/deep wound)     Status: None   Collection Time: 10/20/20  4:12 PM   Specimen: Soft Tissue, Other  Result Value Ref Range Status   Specimen Description TISSUE  Final   Special Requests RIGHT LEG ABSC SPEC A  Final   Gram Stain   Final    RARE WBC PRESENT,BOTH PMN AND MONONUCLEAR NO ORGANISMS SEEN    Culture   Final    FEW METHICILLIN RESISTANT STAPHYLOCOCCUS AUREUS CRITICAL RESULT CALLED TO, READ BACK BY AND VERIFIED WITH: RN N.CROSFON AT [id number] ON 10/22/2020 BY T.SAAD NO ANAEROBES ISOLATED Performed at [facility], [place] [address] [place], [place] [address]    Report Status 10/26/2020 FINAL  Final   Organism ID, Bacteria METHICILLIN RESISTANT STAPHYLOCOCCUS AUREUS  Final      Susceptibility   Methicillin resistant staphylococcus aureus - MIC*    CIPROFLOXACIN >=8 RESISTANT Resistant     ERYTHROMYCIN >=8 RESISTANT Resistant     GENTAMICIN <=0.5 SENSITIVE Sensitive     OXACILLIN >=4 RESISTANT Resistant     TETRACYCLINE <=1 SENSITIVE Sensitive     VANCOMYCIN 1 SENSITIVE Sensitive     TRIMETH/SULFA <=10 SENSITIVE Sensitive     CLINDAMYCIN <=0.25 SENSITIVE Sensitive     RIFAMPIN <=0.5 SENSITIVE Sensitive     Inducible Clindamycin NEGATIVE Sensitive     * FEW  METHICILLIN RESISTANT STAPHYLOCOCCUS AUREUS  Fungus Stain     Status: None   Collection Time: 10/20/20  4:12 PM   Specimen: Soft Tissue, Other  Result Value Ref Range Status   FUNGUS STAIN Final report  Final    Comment: (NOTE) Performed At: [facility] [place], [place] [id number] Rush Farmer MD [id number]    Fungal Source TISSUE  Final    Comment: RIGHT LEG ABSC SPEC A Performed at [facility], [place] [address] [place], [place] [address]   Fungal Stain reflex     Status: None   Collection Time: 10/20/20  4:12 PM  Result Value Ref Range Status    Fungal stain result 1 Comment  Final    Comment: (NOTE) KOH/Calcofluor preparation:  no fungus observed. Performed At: [facility] [place], [place] [id number] Rush Farmer MD [id number]    Radiology Studies: No results found.  LOS: 60 days   Antonieta Pert, MD [facility]  10/30/2020, 11:15 AM

## 2020-10-30 NOTE — Progress Notes (Signed)
Pt left for HD.

## 2020-10-31 DIAGNOSIS — R531 Weakness: Secondary | ICD-10-CM | POA: Diagnosis not present

## 2020-10-31 DIAGNOSIS — N186 End stage renal disease: Secondary | ICD-10-CM | POA: Diagnosis not present

## 2020-10-31 DIAGNOSIS — I639 Cerebral infarction, unspecified: Secondary | ICD-10-CM | POA: Diagnosis not present

## 2020-10-31 DIAGNOSIS — R0602 Shortness of breath: Secondary | ICD-10-CM | POA: Diagnosis not present

## 2020-10-31 DIAGNOSIS — I5032 Chronic diastolic (congestive) heart failure: Secondary | ICD-10-CM | POA: Diagnosis not present

## 2020-10-31 DIAGNOSIS — I132 Hypertensive heart and chronic kidney disease with heart failure and with stage 5 chronic kidney disease, or end stage renal disease: Secondary | ICD-10-CM | POA: Diagnosis not present

## 2020-10-31 DIAGNOSIS — J189 Pneumonia, unspecified organism: Secondary | ICD-10-CM | POA: Diagnosis not present

## 2020-10-31 DIAGNOSIS — A419 Sepsis, unspecified organism: Secondary | ICD-10-CM | POA: Diagnosis not present

## 2020-10-31 DIAGNOSIS — I330 Acute and subacute infective endocarditis: Secondary | ICD-10-CM | POA: Diagnosis not present

## 2020-10-31 DIAGNOSIS — D631 Anemia in chronic kidney disease: Secondary | ICD-10-CM | POA: Diagnosis not present

## 2020-10-31 DIAGNOSIS — Z992 Dependence on renal dialysis: Secondary | ICD-10-CM | POA: Diagnosis not present

## 2020-10-31 DIAGNOSIS — N2581 Secondary hyperparathyroidism of renal origin: Secondary | ICD-10-CM | POA: Diagnosis not present

## 2020-10-31 LAB — POTASSIUM: Potassium: 4.2 mmol/L (ref 3.5–5.1)

## 2020-10-31 LAB — RENAL FUNCTION PANEL
Albumin: 2.3 g/dL — ABNORMAL LOW (ref 3.5–5.0)
Anion gap: 12 (ref 5–15)
BUN: 29 mg/dL — ABNORMAL HIGH (ref 6–20)
CO2: 28 mmol/L (ref 22–32)
Calcium: 8.5 mg/dL — ABNORMAL LOW (ref 8.9–10.3)
Chloride: 91 mmol/L — ABNORMAL LOW (ref 98–111)
Creatinine, Ser: 5.37 mg/dL — ABNORMAL HIGH (ref 0.61–1.24)
GFR, Estimated: 12 mL/min — ABNORMAL LOW (ref >60)
Glucose, Bld: 97 mg/dL (ref 70–99)
Phosphorus: 4.8 mg/dL — ABNORMAL HIGH (ref 2.5–4.6)
Potassium: 4 mmol/L (ref 3.5–5.1)
Sodium: 131 mmol/L — ABNORMAL LOW (ref 135–145)

## 2020-10-31 LAB — CBC WITH DIFFERENTIAL/PLATELET
Abs Immature Granulocytes: 0.15 10*3/uL — ABNORMAL HIGH (ref 0.00–0.07)
Basophils Absolute: 0.1 10*3/uL (ref 0.0–0.1)
Basophils Relative: 0 %
Eosinophils Absolute: 0.2 10*3/uL (ref 0.0–0.5)
Eosinophils Relative: 1 %
HCT: 25.3 % — ABNORMAL LOW (ref 39.0–52.0)
Hemoglobin: 7.7 g/dL — ABNORMAL LOW (ref 13.0–17.0)
Immature Granulocytes: 1 %
Lymphocytes Relative: 10 %
Lymphs Abs: 1.6 10*3/uL (ref 0.7–4.0)
MCH: 27.6 pg (ref 26.0–34.0)
MCHC: 30.4 g/dL (ref 30.0–36.0)
MCV: 90.7 fL (ref 80.0–100.0)
Monocytes Absolute: 2.7 10*3/uL — ABNORMAL HIGH (ref 0.1–1.0)
Monocytes Relative: 17 %
Neutro Abs: 11.2 10*3/uL — ABNORMAL HIGH (ref 1.7–7.7)
Neutrophils Relative %: 71 %
Platelets: 360 10*3/uL (ref 150–400)
RBC: 2.79 MIL/uL — ABNORMAL LOW (ref 4.22–5.81)
RDW: 17.1 % — ABNORMAL HIGH (ref 11.5–15.5)
WBC: 15.9 10*3/uL — ABNORMAL HIGH (ref 4.0–10.5)
nRBC: 0 % (ref 0.0–0.2)

## 2020-10-31 MED ORDER — HYDROMORPHONE HCL 1 MG/ML IJ SOLN
1.0000 mg | Freq: Once | INTRAMUSCULAR | Status: AC
  Administered 2020-11-01: 1 mg via INTRAVENOUS
  Filled 2020-10-31: qty 2

## 2020-10-31 NOTE — Progress Notes (Signed)
Mobility Specialist - Progress Note   10/31/20 1109  Mobility  Activity Ambulated in room  Level of Assistance Contact guard assist, steadying assist  Assistive Device Front wheel walker  Distance Ambulated (ft) 75 ft (60 ft x 1, 15 ft x 1)  Mobility Response Tolerated well  Mobility performed by Mobility specialist  $Mobility charge 1 Mobility   Pre-mobility: 80 HR, 117/85 BP Post-mobility: 88 HR, 121/69 BP  Pt able to stand w/ elevated bed height w/o assist, min guard during ambulation. Pt took one seated rest break, then ambulated to recliner. He was asx throughout. Pt given call bell and expressed understanding to call for assistance when he'd like to get out of the chair.   Pricilla Handler Mobility Specialist Mobility Specialist Phone: [phone]

## 2020-10-31 NOTE — Progress Notes (Signed)
Occupational Therapy Re-Evaluation Patient Details Name: Patrick Anthony MRN: [id number] DOB: 1971-07-18 Today's Date: 10/31/2020    History of Present Illness Pt is a 50 y.o. male admitted 09/27/20 with fever, chills, chest pain, SOB, generalized weakness. Pt found to have MRSA bacteremia. 2/22 MRI showed multiple scattered small infarcts (bilateral cerebral hemispheres, R basal ganglia, bileteral cerebellum); suspect embolic process. Echo showed mitral valve vegetation, mild-moderate mitral regurgitation. No significant CAD on L heart cath. 3/8 dental extraction. 3/10 mini MVR with closure of PFO. 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 PMH includes CKD (HD MWF), HTN, CHF, Hep C, polysubstance abuse.   Clinical Impression   Prior to hospitalization, pt was living alone in a 1-level apartment with a flight of steps to enter (bilateral hand railings). Pt was independent with ADLs/ADL mobility/IADLs without use of mobility device. Pt reports not working and driving himself to HD weekly. Today, pt received semi-reclined in bed, pt agreeable to OT re-eval s/p recent R AKA. Pt presents with improved strength/cognition since last tx session. Currently, pt performs bed mobility with Mod I, min guard for sit>stand from elevated EOB using RW for support, min guard for functional ambulation from bed>BSC across room, sink>chair using RW for support. Pt requires min guard for grooming in standing at sink, min assist-min guard for LB self-care, and setup-Mod I for UB self-care. Educated pt on RW safety management, NWB RLE, AE/DME, activity pacing, adaptive ADL strategies, PLB, process of acute care therapy and beyond. Pt would benefit from continued skilled acute care OT services to maximize independence with ADLs/ADL mobility. Will follow pt in-house, see recs below.     Follow Up Recommendations  CIR;Other (comment) (if CIR is not possible then OPOT with amputation focus  and 24/7 S/A initially to ensure safety)    Equipment Recommendations  Other (comment) (defer to CIR but will likely need RW, BSC, tub transfer bench)    Recommendations for Other Services Other (comment) (None)     Precautions / Restrictions Precautions Precautions: Fall;Other (comment) (R AKA) Precaution Comments:  (RLE residual limb wound vac) Restrictions Weight Bearing Restrictions: Yes RLE Weight Bearing: Non weight bearing      Mobility Bed Mobility Overal bed mobility: Modified Independent Bed Mobility: Rolling;Supine to Sit Rolling: Modified independent (Device/Increase time)   Supine to sit: Modified independent (Device/Increase time)     General bed mobility comments: HOB elevated and using grab bar    Transfers Overall transfer level: Needs assistance Equipment used: Rolling walker (2 wheeled) Transfers: Sit to/from Stand Sit to Stand: Min guard;From elevated surface (using RW support)         General transfer comment: min guard for sit>stand from EOB using RW, ambulated from EOB>BSC across room>sink>chair using RW for support    Balance Overall balance assessment: Needs assistance Sitting-balance support: Feet supported Sitting balance-Leahy Scale: Good     Standing balance support: Bilateral upper extremity supported;During functional activity Standing balance-Leahy Scale: Good Standing balance comment: using RW for support; no LOB noted        ADL either performed or assessed with clinical judgement   ADL Overall ADL's : Needs assistance/impaired Eating/Feeding: Independent   Grooming: Wash/dry hands;Wash/dry face;Oral care;Supervision/safety;Min guard;Standing Grooming Details (indicate cue type and reason): standing at sink with RW Upper Body Bathing: Modified independent;Sitting   Lower Body Bathing: Min guard;Supervison/ safety;Sitting/lateral leans;Sit to/from stand Lower Body Bathing Details (indicate cue type and reason): using RW  for support, educated about long handled sponge Upper  Body Dressing : Modified independent;Sitting Upper Body Dressing Details (indicate cue type and reason): to don posterior gown in sitting Lower Body Dressing: Min guard;Minimal assistance;Sitting/lateral leans;Sit to/from stand Lower Body Dressing Details (indicate cue type and reason): able to manage L sock sitting EOB, anticipate min assist-min guard for underwear/pants management using RW for support Toilet Transfer: Min guard;Supervision/safety;Ambulation;BSC;RW Toilet Transfer Details (indicate cue type and reason): assist with wound vac management Toileting- Clothing Manipulation and Hygiene: Modified independent;Sitting/lateral lean Toileting - Clothing Manipulation Details (indicate cue type and reason): simulated sitting EOB Tub/ Shower Transfer:  (not assessed today)   Functional mobility during ADLs: Min guard;Rolling walker General ADL Comments: provided education about AE/DME, activity pacing, PLB, adaptive ADL strategies, and safety awareness     Vision Baseline Vision/History: No visual deficits Patient Visual Report: No change from baseline Vision Assessment?: No apparent visual deficits     Perception Perception Perception Tested?: No   Praxis Praxis Praxis tested?: Not tested    Pertinent Vitals/Pain Pain Assessment: 0-10 Pain Score: 3  Pain Location: RLE residual limb Pain Descriptors / Indicators: Sore;Aching Pain Intervention(s): Premedicated before session;Repositioned;Monitored during session     Hand Dominance Right   Extremity/Trunk Assessment Upper Extremity Assessment Upper Extremity Assessment: Overall WFL for tasks assessed   Lower Extremity Assessment Lower Extremity Assessment: Overall WFL for tasks assessed;RLE deficits/detail (RLE limited 2/2 recent R AKA, pt able to hip flex and hip abduct at bed level)   Cervical / Trunk Assessment Cervical / Trunk Assessment: Normal   Communication  Communication Communication: No difficulties   Cognition Arousal/Alertness: Awake/alert Behavior During Therapy: WFL for tasks assessed/performed Overall Cognitive Status: Within Functional Limits for tasks assessed     General Comments: cognition is improving   General Comments  wound vac to RLE residual limb, slight edema noted to RLE residual limb however difficult to assess 2/2 wound vac      Home Living Family/patient expects to be discharged to:: Private residence Living Arrangements: Alone Available Help at Discharge: Other (Comment) Type of Home: Apartment Home Access: Stairs to enter Entrance Stairs-Number of Steps: flight Entrance Stairs-Rails: Right;Left;Can reach both Home Layout: One level     Bathroom Shower/Tub: Teacher, early years/pre: Standard Bathroom Accessibility: Yes How Accessible: Accessible via walker Home Equipment: Shower seat - built in   Additional Comments: Pt states he has a handicapped accessible tub that can convert into a shower      Prior Functioning/Environment Level of Independence: Independent        Comments: likes to fish, doesn't work, drives himself to HD        OT Problem List: Decreased activity tolerance;Impaired balance (sitting and/or standing);Decreased strength;Pain;Increased edema      OT Treatment/Interventions: Self-care/ADL training;Therapeutic exercise;Neuromuscular education;Energy conservation;DME and/or AE instruction;Therapeutic activities;Patient/family education    OT Goals(Current goals can be found in the care plan section) Acute Rehab OT Goals Patient Stated Goal: to return home and be independent OT Goal Formulation: With patient Time For Goal Achievement: 11/14/20 Potential to Achieve Goals: Good  OT Frequency: Min 2X/week    AM-PAC OT "6 Clicks" Daily Activity     Outcome Measure Help from another person eating meals?: None Help from another person taking care of personal grooming?:  A Little Help from another person toileting, which includes using toliet, bedpan, or urinal?: A Little Help from another person bathing (including washing, rinsing, drying)?: A Little Help from another person to put on and taking off regular upper body clothing?: None  Help from another person to put on and taking off regular lower body clothing?: A Little 6 Click Score: 20   End of Session Equipment Utilized During Treatment: Rolling walker Nurse Communication: Mobility status  Activity Tolerance: Patient tolerated treatment well Patient left: in chair;with call bell/phone within reach  OT Visit Diagnosis: Unsteadiness on feet (R26.81);Muscle weakness (generalized) (M62.81);Pain Pain - Right/Left: Right Pain - part of body: Leg (RLE residual limb)                Time: [id number] OT Time Calculation (min): 45 min Charges:  OT General Charges $OT Visit: 1 Visit OT Evaluation $OT Eval- Re-eval OT Treatments $Self Care/Home Management : 8-22 mins $Therapeutic Activity: 23-37 mins  Michel Bickers, OTR/[facility] [phone]   Francesca Jewett 10/31/2020, 4:03 PM

## 2020-10-31 NOTE — Progress Notes (Signed)
PROGRESS NOTE    Patrick Anthony  [id number] DOB: 09-13-1970 DOA: 09/27/2020 PCP: [name], MD   Chief Complaint  Patient presents with  . 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  Seen by ID managed with IV antibiotic, CT surgery consult salt was obtained subsequently underwent minimally invasive mitral valve replacement on 10/14/2020 and was managed under CT surgery service and subsequently transferred to [facility] on 3/16.  Patient was followed by ID, orthopedics.  He was found to have swelling on the right leg and seen by orthopedic underwent debridement on the right leg and treated for large complicated abscess with osteomyelitis involving his tibia with 7 by X.3 centimeter area of bone that was excised. Patient was seen by Dr. Sharol Given orthopedic ID and the right above-the-knee amputation has been recommended moving forward to manage this infection. Patient has been told by multiple providers multiple times the need for the above-the-knee amputation to avoid seeding of his new valve and his graft but has been reluctant and he is aware about dying from staph aureus bacteremia and sepsis. 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 Patient reports his pain is much improved now and he feels he is resuming his good now.  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 Sharol Given. Cont vancomycin w/ HD- duration 3/10>11/25/2020, per ID. Repeat blood cultures from 10/13/20 are negative. Patient is status post right TKA 10/29/20-although he had refused initially. Continue pain management with  Scheduled Toradol as needed oxycodone Dilaudid po/iv, gabapentin and Robaxin.   Status post right TKA continue pain management , wound vac as above.  ESRD and HD MWF: Status post HD 3/26.  Management as per nephrology  Hyperkalemia: s/p Lokelma, HD- resolved Recent Labs  Lab 10/30/20 1253 10/30/20 1601 10/30/20 1939 10/31/20 0224 10/31/20 0741  K 3.6 4.2 4.1 4.0 4.2   Secondary hyperparathyroidism-calcium and phosphorus stable continue Sensipar binders and Hectorol per nephrology.  Monitor lab  Anemia of chronic kidney disease plus anemia of acute blood loss in the setting of AKA, Status post 2 unit PRBC so far-Hb slowly downtrending monitor and transfuse if < 7 gm. Recent Labs  Lab 10/27/20 0122 10/28/20 0152 10/29/20 0138 10/30/20 0157 10/31/20 0224  HGB 8.9* 10.0* 8.5* 8.1* 7.7*  HCT 27.4* 32.3* 27.7* 26.3* 25.3*   History of hepatitis C/liver cirrhosis-outpatient follow-up with ID.Monitor LFTs intermittently.  He has very low albumin. Recent Labs  Lab 10/27/20 0122 10/28/20 0152 10/29/20 0138 10/30/20 0157 10/31/20 0224  ALBUMIN 2.1* 2.2* 2.1* 2.4* 2.3*   Hyponatremia level fluctuating -adjust w/ HD- Na at 218-132.  Hypertension BP is well controlled, continue metoprolol and clonidine patch.  Nasal bleeding on 3/20 resolved.  Diet Order            Diet renal with fluid restriction Fluid restriction: 1200 mL Fluid; Room service appropriate? Yes; Fluid consistency:  Thin  Diet effective now                 Nutrition Problem: Increased nutrient needs Etiology: chronic illness (ESRD on HD) Signs/Symptoms: estimated needs Interventions: Nepro shake,Refer to RD note for recommendations Patient's Body mass index is 20.87 kg/m.  DVT prophylaxis: SCDs Start: 10/29/20 1639 SCDs Start: 10/20/20 1721 enoxaparin (LOVENOX) injection 30 mg Start: 10/19/20 0800 SCDs Start: 10/15/20 0610 Code Status:   Code Status: Full Code  Family Communication:plan of care discussed with patient at bedside. I had discussed with [name] extensively 3/23  Status [id number] Remains inpatient appropriate because:IV treatments appropriate due to intensity of illness or inability to take PO and Inpatient level of care appropriate due to severity of illness  Dispo:The patient is from: Home            Anticipated d/c is to: Home likely will need CIR;post AKA.PTOT to cont, cont pain control.            Patient currently is not medically stable to d/c.            Difficult to place patient No  Unresulted Labs (From admission, onward)          Start     Ordered   10/21/20 0500  Renal function panel  Daily,   R     Question:  Specimen collection method  Answer:  Lab=Lab collect   10/20/20 2128   10/21/20 0500  CBC with Differential/Platelet  Daily,   R     Question:  Specimen collection method  Answer:  Lab=Lab collect   10/20/20 2128   Signed and Held  Renal function panel  Once,   R       Question:  Specimen collection method  Answer:  Lab=Lab collect   Signed and Held         Medications reviewed:  Scheduled Meds: . (feeding supplement) PROSource Plus  30 mL Oral BID BM  . aspirin EC  325 mg Oral Daily  . calcium acetate  667 mg Oral TID WC  . Chlorhexidine Gluconate Cloth  6 each Topical Q0600  . cinacalcet  60 mg Oral Q breakfast  . cloNIDine  0.3 mg Transdermal Weekly  . [START ON 11/01/2020] darbepoetin (ARANESP) injection - DIALYSIS  60 mcg  Intravenous Q Mon-HD  . docusate sodium  100 mg Oral BID  . enoxaparin (LOVENOX) injection  30 mg Subcutaneous Q48H  . feeding supplement (NEPRO CARB STEADY)  237 mL Oral TID BM  . gabapentin  300 mg Oral QHS  . influenza vac split quadrivalent PF  0.5 mL Intramuscular Tomorrow-1000  . ketorolac  7.5 mg Intravenous Q12H  . metoprolol tartrate  50 mg Oral BID  . multivitamin  1 tablet Oral QHS  . oxymetazoline  1 spray Each Nare BID  . pantoprazole  40 mg Oral Daily  . senna-docusate  1 tablet Oral BID  . sodium chloride flush  3 mL Intravenous Q12H   Continuous Infusions: . sodium chloride    . sodium chloride 10 mL/hr at 10/29/20 1351  . sodium chloride    . methocarbamol (ROBAXIN) IV    . vancomycin 750 mg (10/27/20 2116)   Consultants:see note  Procedures:see note  Antimicrobials: Anti-infectives (From admission, onward)   Start  Dose/Rate Route Frequency Ordered Stop   10/30/20 1100  vancomycin (VANCOCIN) IVPB 750 mg/150 ml premix        750 mg 150 mL/hr over 60 Minutes Intravenous Once in dialysis 10/30/20 0833 10/30/20 1317   10/29/20 1000  ceFAZolin (ANCEF) IVPB 2g/100 mL premix  Status:  Discontinued        2 g 200 mL/hr over 30 Minutes Intravenous To Short Stay 10/29/20 0730 10/29/20 1634   10/27/20 2015  vancomycin (VANCOCIN) IVPB 750 mg/150 ml premix        750 mg 150 mL/hr over 60 Minutes Intravenous Every M-W-F (Hemodialysis) 10/27/20 2005     10/25/20 0922  vancomycin (VANCOCIN) 500-5 MG/100ML-% IVPB       Note to Pharmacy: Kalman Shan   : cabinet override      10/25/20 0922 10/25/20 0958   10/21/20 0600  ceFAZolin (ANCEF) IVPB 2g/100 mL premix        2 g 200 mL/hr over 30 Minutes Intravenous On call to O.R. 10/20/20 1349 10/21/20 0650   10/20/20 2243  vancomycin (VANCOCIN) 500-5 MG/100ML-% IVPB       Note to Pharmacy: Wallace Cullens   : cabinet override      10/20/20 2243 10/20/20 2243   10/20/20 1437  ceFAZolin (ANCEF) 2-4 GM/100ML-% IVPB        Note to Pharmacy: Ladoris Gene   : cabinet override      10/20/20 1437 10/20/20 1612   10/20/20 1200  vancomycin (VANCOREADY) IVPB 500 mg/100 mL  Status:  Discontinued        500 mg 100 mL/hr over 60 Minutes Intravenous Every M-W-F (Hemodialysis) 10/20/20 0903 10/27/20 2005   10/18/20 1124  vancomycin variable dose per unstable renal function (pharmacist dosing)  Status:  Discontinued         Does not apply See admin instructions 10/18/20 1124 10/20/20 0903   10/16/20 1600  vancomycin (VANCOREADY) IVPB 500 mg/100 mL  Status:  Discontinued        500 mg 100 mL/hr over 60 Minutes Intravenous Every Sat (Hemodialysis) 10/16/20 1306 10/16/20 1312   10/15/20 1500  vancomycin (VANCOCIN) IVPB 500 mg/100 ml premix  Status:  Discontinued        500 mg 100 mL/hr over 60 Minutes Intravenous Every M-W-F (Hemodialysis) 10/15/20 1357 10/17/20 0942   10/14/20 2130  vancomycin (VANCOCIN) IVPB 1000 mg/200 mL premix  Status:  Discontinued        1,000 mg 200 mL/hr over 60 Minutes Intravenous  Once 10/14/20 1403 10/14/20 1547   10/14/20 2130  vancomycin (VANCOREADY) IVPB 500 mg/100 mL        500 mg 100 mL/hr over 60 Minutes Intravenous  Once 10/14/20 1547 10/14/20 2222   10/14/20 1730  cefUROXime (ZINACEF) 1.5 g in sodium chloride 0.9 % 100 mL IVPB        1.5 g 200 mL/hr over 30 Minutes Intravenous Every 24 hr x 2 10/14/20 1403 10/15/20 1718   10/14/20 1128  vancomycin (VANCOCIN) 1,000 mg in sodium chloride 0.9 % 1,000 mL irrigation  Status:  Discontinued          As needed 10/14/20 1128 10/14/20 1401   10/14/20 0400  vancomycin (VANCOREADY) IVPB 1250 mg/250 mL        1,250 mg 166.7 mL/hr over 90 Minutes Intravenous To Surgery 10/13/20 1039 10/14/20 0910   10/14/20 0400  cefUROXime (ZINACEF) 1.5 g in sodium chloride 0.9 % 100 mL IVPB  1.5 g 200 mL/hr over 30 Minutes Intravenous To Surgery 10/13/20 1039 10/14/20 0835   10/14/20 0400  cefUROXime (ZINACEF) 750 mg in sodium chloride 0.9 % 100 mL IVPB         750 mg 200 mL/hr over 30 Minutes Intravenous To Surgery 10/13/20 1039 10/14/20 1250   10/14/20 0400  vancomycin (VANCOCIN) 1,000 mg in sodium chloride 0.9 % 1,000 mL irrigation  Status:  Discontinued         Irrigation To Surgery 10/13/20 1039 10/14/20 1403   10/13/20 1657  vancomycin (VANCOCIN) 500-5 MG/100ML-% IVPB       Note to Pharmacy: Judieth Keens  : cabinet override      10/13/20 1657 10/13/20 1717   10/12/20 0600  ceFAZolin (ANCEF) IVPB 2g/100 mL premix        2 g 200 mL/hr over 30 Minutes Intravenous On call to O.R. 10/11/20 1841 10/12/20 1315   10/11/20 1415  vancomycin (VANCOCIN) IVPB 500 mg/100 ml premix  Status:  Discontinued        500 mg 100 mL/hr over 60 Minutes Intravenous Every M-W-F (Hemodialysis) 10/11/20 1413 10/14/20 1403   10/11/20 1413  vancomycin (VANCOCIN) 500-5 MG/100ML-% IVPB       Note to Pharmacy: Kalman Shan   : cabinet override      10/11/20 1413 10/12/20 0229   10/11/20 1353  Vancomycin (VANCOCIN) 750-5 MG/150ML-% IVPB  Status:  Discontinued       Note to Pharmacy: Kalman Shan   : cabinet override      10/11/20 1353 10/11/20 1417   10/08/20 1200  vancomycin (VANCOCIN) IVPB 750 mg/150 ml premix  Status:  Discontinued        750 mg 150 mL/hr over 60 Minutes Intravenous Every M-W-F (Hemodialysis) 10/05/20 1404 10/11/20 1413   10/06/20 1619  vancomycin (VANCOCIN) 500-5 MG/100ML-% IVPB       Note to Pharmacy: Judieth Keens  : cabinet override      10/06/20 1619 10/06/20 1736   10/06/20 1200  vancomycin (VANCOCIN) IVPB 750 mg/150 ml premix  Status:  Discontinued        750 mg 150 mL/hr over 60 Minutes Intravenous Every M-W-F (Hemodialysis) 10/04/20 0905 10/05/20 1404   10/06/20 1200  vancomycin (VANCOCIN) IVPB 500 mg/100 ml premix        500 mg 100 mL/hr over 60 Minutes Intravenous Every Wed (Hemodialysis) 10/05/20 1404 10/06/20 2000   10/01/20 2000  DAPTOmycin (CUBICIN) 840 mg in sodium chloride 0.9 % IVPB  Status:  Discontinued         840 mg 233.6 mL/hr over 30 Minutes Intravenous Every 48 hours 10/01/20 0922 10/01/20 1056   10/01/20 1200  vancomycin (VANCOCIN) IVPB 1000 mg/200 mL premix  Status:  Discontinued        1,000 mg 200 mL/hr over 60 Minutes Intravenous Every M-W-F (Hemodialysis) 10/01/20 1056 10/04/20 0845   09/29/20 1200  vancomycin (VANCOCIN) IVPB 1000 mg/200 mL premix  Status:  Discontinued        1,000 mg 200 mL/hr over 60 Minutes Intravenous Every M-W-F (Hemodialysis) 09/27/20 1443 10/01/20 0922   09/29/20 1200  ceFEPIme (MAXIPIME) 2 g in sodium chloride 0.9 % 100 mL IVPB  Status:  Discontinued        2 g 200 mL/hr over 30 Minutes Intravenous Every M-W-F (Hemodialysis) 09/27/20 1443 09/28/20 1054   09/28/20 0900  metroNIDAZOLE (FLAGYL) IVPB 500 mg  Status:  Discontinued        500 mg 100 mL/hr over  60 Minutes Intravenous Every 12 hours 09/27/20 1808 09/28/20 1054   09/28/20 0900  ceFEPIme (MAXIPIME) 1 g in sodium chloride 0.9 % 100 mL IVPB  Status:  Discontinued        1 g 200 mL/hr over 30 Minutes Intravenous Every 12 hours 09/27/20 1808 09/27/20 1812   09/27/20 1445  vancomycin (VANCOREADY) IVPB 2000 mg/400 mL        2,000 mg 200 mL/hr over 120 Minutes Intravenous  Once 09/27/20 1433 09/27/20 2330   09/27/20 1430  ceFEPIme (MAXIPIME) 2 g in sodium chloride 0.9 % 100 mL IVPB        2 g 200 mL/hr over 30 Minutes Intravenous  Once 09/27/20 1418 09/27/20 1541   09/27/20 1430  metroNIDAZOLE (FLAGYL) IVPB 500 mg        500 mg 100 mL/hr over 60 Minutes Intravenous  Once 09/27/20 1418 09/27/20 2214   09/27/20 1430  vancomycin (VANCOREADY) IVPB 1750 mg/350 mL  Status:  Discontinued        1,750 mg 175 mL/hr over 120 Minutes Intravenous  Once 09/27/20 1418 09/27/20 1433     Culture/Microbiology    Component Value Date/Time   SDES TISSUE 10/20/2020 1612   SPECREQUEST RIGHT LEG ABSC SPEC A 10/20/2020 1612   CULT  10/20/2020 1612    FEW METHICILLIN RESISTANT STAPHYLOCOCCUS AUREUS CRITICAL RESULT CALLED  TO, READ BACK BY AND VERIFIED WITH: RN N.CROSFON AT 0954 ON 10/22/2020 BY T.SAAD NO ANAEROBES ISOLATED Performed at [facility], [place] [address], [place], [place] [address]    REPTSTATUS 10/26/2020 FINAL 10/20/2020 1612    Other culture-see note  Objective: Vitals: Today's Vitals   10/31/20 0207 10/31/20 0238 10/31/20 0436 10/31/20 0818  BP:   115/82 130/75  Pulse:   83 91  Resp:   20 19  Temp:   98 F (36.7 C) 98.1 F (36.7 C)  TempSrc:   Oral Oral  SpO2:   97% 98%  Weight:   69.8 kg   Height:      PainSc: 7  Asleep      Intake/Output Summary (Last 24 hours) at 10/31/2020 1047 Last data filed at 10/30/2020 2300 Gross per 24 hour  Intake --  Output 3900 ml  Net -3900 ml   Filed Weights   10/30/20 0800 10/30/20 1213 10/31/20 0436  Weight: 73.5 kg 69.8 kg 69.8 kg   Weight change: -7.104 kg  Intake/Output from previous day: 03/26 0701 - 03/27 0700 In: -  Out: 3900 [Drains:50] Intake/Output this shift: No intake/output data recorded. Filed Weights   10/30/20 0800 10/30/20 1213 10/31/20 0436  Weight: 73.5 kg 69.8 kg 69.8 kg   Examination: General exam: AAOx3 , NAD, weak appearing. HEENT:Oral mucosa moist, Ear/Nose WNL grossly, dentition normal. Respiratory system: bilaterally clear,no wheezing or crackles,no use of accessory muscle Cardiovascular system: S1 & S2 +, No JVD,. Gastrointestinal system: Abdomen soft, NT,ND, BS+ Nervous System:Alert, awake, moving extremities and grossly nonfocal Extremities: Rt AKA stump on vac. Skin: No rashes,no icterus. MSK: Normal muscle bulk,tone, power   Data Reviewed: I have personally reviewed following labs and imaging studies CBC: Recent Labs  Lab 10/27/20 0122 10/28/20 0152 10/29/20 0138 10/30/20 0157 10/31/20 0224  WBC 14.2* 10.9* 10.0 21.8* 15.9*  NEUTROABS 10.6* 8.0* 6.8 19.2* 11.2*  HGB 8.9* 10.0* 8.5* 8.1* 7.7*  HCT 27.4* 32.3* 27.7* 26.3* 25.3*  MCV 89.5 90.0 91.1 90.4 90.7  PLT 451* 415* 389 440*  [id number]   Basic Metabolic Panel: Recent Labs  Lab 10/27/20 0122 10/28/20 0152 10/29/20 0138 10/30/20 0157 10/30/20 0739 10/30/20 1253 10/30/20 1601 10/30/20 1939 10/31/20 0224 10/31/20 0741  NA 129* 132* 130* 128*  --   --   --   --  131*  --   K 4.0 3.9 4.1 6.4*   < > 3.6 4.2 4.1 4.0 4.2  CL 90* 95* 93* 93*  --   --   --   --  91*  --   CO2 '26 26 25 22  '$ --   --   --   --  28  --   GLUCOSE 108* 115* 111* 98  --   --   --   --  97  --   BUN 50* 22* 38* 51*  --   --   --   --  29*  --   CREATININE 7.90* 4.75* 7.11* 8.77*  --   --   --   --  5.37*  --   CALCIUM 8.5* 8.5* 8.6* 8.4*  --   --   --   --  8.5*  --   PHOS 5.4* 3.2 4.7* 5.3*  --   --   --   --  4.8*  --    < > = values in this interval not displayed.   GFR: Estimated Creatinine Clearance: 16.4 mL/min (A) (by C-G formula based on SCr of 5.37 mg/dL (H)). Liver Function Tests: Recent Labs  Lab 10/27/20 0122 10/28/20 0152 10/29/20 0138 10/30/20 0157 10/31/20 0224  ALBUMIN 2.1* 2.2* 2.1* 2.4* 2.3*   No results for input(s): LIPASE, AMYLASE in the last 168 hours. No results for input(s): AMMONIA in the last 168 hours. Coagulation Profile: No results for input(s): INR, PROTIME in the last 168 hours. Cardiac Enzymes: No results for input(s): CKTOTAL, CKMB, CKMBINDEX, TROPONINI in the last 168 hours. BNP (last 3 results) No results for input(s): PROBNP in the last 8760 hours. HbA1C: No results for input(s): HGBA1C in the last 72 hours. CBG: No results for input(s): GLUCAP in the last 168 hours. Lipid Profile: No results for input(s): CHOL, HDL, LDLCALC, TRIG, CHOLHDL, LDLDIRECT in the last 72 hours. Thyroid Function Tests: No results for input(s): TSH, T4TOTAL, FREET4, T3FREE, THYROIDAB in the last 72 hours. Anemia Panel: No results for input(s): VITAMINB12, FOLATE, FERRITIN, TIBC, IRON, RETICCTPCT in the last 72 hours. Sepsis Labs: No results for input(s): PROCALCITON, LATICACIDVEN in the last 168 hours.  No  results found for this or any previous visit (from the past 240 hour(s)). Radiology Studies: No results found.  LOS: 16 days   Antonieta Pert, MD [facility]  10/31/2020, 10:47 AM

## 2020-10-31 NOTE — Progress Notes (Addendum)
Physical Therapy Treatment Patient Details Name: Patrick Anthony MRN: [id number] DOB: [birth year] Today's Date: 10/31/2020    History of Present Illness 50 y.o. male admitted 09/27/20 with fever, chills, chest pain, SOB, generalized weakness. Pt found to have MRSA bacteremia. 2/22 MRI showed multiple scattered small infarcts (bilateral cerebral hemispheres, R basal ganglia, bileteral cerebellum); suspect embolic process. Echo showed mitral valve vegetation, mild-moderate mitral regurgitation. No significant CAD on L heart cath. 3/8 dental extraction. 3/10 mini MVR with closure of PFO. 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 Pt underwent R AKA on 10/29/2020. PMH includes CKD (HD MWF), HTN, CHF, Hep C, polysubstance abuse.    PT Comments    PT updates goals and POC based on patient's current functional status after R AKA. Pt is currently able to perform hop-to gait with limited physical assistance, although for limited household distances. Pt does require close guard from therapist for safety when performing dynamic balance activities in standing. Pt will benefit from continued aggressive mobilization and PT POC to improve mobility and reduce falls risk. Pt expresses interest in post-acute inpatient rehab to aide in a return to independent mobility as he was independent prior to this admission. PT recommends inpatient PT placement at this time as the pt has limited caregiver support at the time of discharge. Pt requesting CIR consult.  Follow Up Recommendations  CIR (SNF if denies CIR)     Equipment Recommendations  Rolling walker with 5" wheels;3in1 (PT)    Recommendations for Other Services       Precautions / Restrictions Precautions Precautions: Fall;Other (comment) Precaution Comments: R AKA Restrictions Weight Bearing Restrictions: Yes RLE Weight Bearing: Non weight bearing    Mobility  Bed Mobility Overal bed mobility: Modified  Independent Bed Mobility: Rolling;Supine to Sit Rolling: Modified independent (Device/Increase time)   Supine to sit: Modified independent (Device/Increase time)     General bed mobility comments: not assessed    Transfers Overall transfer level: Needs assistance Equipment used: Rolling walker (2 wheeled) Transfers: Sit to/from Stand Sit to Stand: Min guard         General transfer comment: min guard for sit>stand from EOB using RW, ambulated from EOB>BSC across room>sink>chair using RW for support  Ambulation/Gait Ambulation/Gait assistance: Min guard Gait Distance (Feet): 50 Feet Assistive device: Rolling walker (2 wheeled) Gait Pattern/deviations:  (hop-to gait) Gait velocity: decreased Gait velocity interpretation: <1.31 ft/sec, indicative of household ambulator General Gait Details: pt with short hop-to gait, reduced foot clearance with fatigue   Stairs             Wheelchair Mobility    Modified Rankin (Stroke Patients Only) Modified Rankin (Stroke Patients Only) Pre-Morbid Rankin Score: No symptoms Modified Rankin: Moderately severe disability     Balance Overall balance assessment: Needs assistance Sitting-balance support: No upper extremity supported;Feet supported Sitting balance-Leahy Scale: Good     Standing balance support: Single extremity supported;During functional activity;Bilateral upper extremity supported Standing balance-Leahy Scale: Poor Standing balance comment: reliant on UE support of RW                            Cognition Arousal/Alertness: Awake/alert Behavior During Therapy: WFL for tasks assessed/performed Overall Cognitive Status: Within Functional Limits for tasks assessed                                 General Comments:  cognition is improving      Exercises Other Exercises Other Exercises: PT encourages performances of chair push-ups to improve tricep endurance and UE strength for hop-to  gait    General Comments General comments (skin integrity, edema, etc.): wound vac RLE, VSS on RA      Pertinent Vitals/Pain Pain Assessment: Faces Pain Score: 3  Faces Pain Scale: Hurts even more Pain Location: R residual limb Pain Descriptors / Indicators: Sore Pain Intervention(s): Patient requesting pain meds-RN notified    Home Living Family/patient expects to be discharged to:: Private residence Living Arrangements: Alone Available Help at Discharge: Other (Comment) Type of Home: Apartment Home Access: Stairs to enter Entrance Stairs-Rails: Right;Left;Can reach both Home Layout: One level Home Equipment: Shower seat - built in Additional Comments: Pt states he has a handicapped accessible tub that can convert into a shower    Prior Function Level of Independence: Independent      Comments: likes to fish, doesn't work, drives himself to HD   PT Goals (current goals can now be found in the care plan section) Acute Rehab PT Goals Patient Stated Goal: to return home and be independent Time For Goal Achievement: 11/14/20 Progress towards PT goals: Progressing toward goals    Frequency    Min 3X/week      PT Plan Current plan remains appropriate    Co-evaluation              AM-PAC PT "6 Clicks" Mobility   Outcome Measure  Help needed turning from your back to your side while in a flat bed without using bedrails?: None Help needed moving from lying on your back to sitting on the side of a flat bed without using bedrails?: None Help needed moving to and from a bed to a chair (including a wheelchair)?: A Little Help needed standing up from a chair using your arms (e.g., wheelchair or bedside chair)?: A Little Help needed to walk in hospital room?: A Little Help needed climbing 3-5 steps with a railing? : A Lot 6 Click Score: 19    End of Session Equipment Utilized During Treatment: Gait belt Activity Tolerance: Patient tolerated treatment well Patient  left: in chair;with call bell/phone within reach Nurse Communication: Mobility status PT Visit Diagnosis: Difficulty in walking, not elsewhere classified (R26.2);Other abnormalities of gait and mobility (R26.89)     Time: [id number] PT Time Calculation (min) (ACUTE ONLY): 17 min  Charges:  1 Re-evaluation                    Zenaida Niece, PT, DPT Acute Rehabilitation Pager: [phone]    Zenaida Niece 10/31/2020, 4:32 PM

## 2020-10-31 NOTE — Progress Notes (Signed)
[facility] Progress Note  Subjective: pain is well controlled today. 3.9 L off w/ HD yest.   Vitals:   10/30/20 2302 10/31/20 0436 10/31/20 0818 10/31/20 1205  BP: 106/76 115/82 130/75 116/86  Pulse: 89 83 91 87  Resp: '16 20 19 18  '$ Temp: 98.4 F (36.9 C) 98 F (36.7 C) 98.1 F (36.7 C) 97.7 F (36.5 C)  TempSrc: Oral Oral Oral Oral  SpO2: 100% 97% 98% 99%  Weight:  69.8 kg    Height:        Exam:  alert, nad   no jvd  Chest cta bilat  Cor reg no RG  Abd soft ntnd no ascites   Ext no LE edema, R AKA wrapped   Alert, NF, ox3   LUE AVG +bruit     [facility] HD: [facility]    4h 350/500 new edw here 69- 71kg (was82.5kg)2/2.5 bath LUA AVGHep 5000 -Aranesp 68mgIVq 2wks - last 09/27/20 (Verified order) -Hectorol384m IV qHD    Assessment/ Plan: 1. Anterior shin osteomyelitis: status post I&D per orthopedics. Pathology showed staph aureus in the tibia. 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 Pain controlled today.  2. SP min invasive MVR(bioprosthetic)replacement - on 3/10. Doing well from cardiac standpoint 3. 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 Underwent tooth extraction3/8/22.PerID to get 6 wks IV vanc w/ end date of 11/25/20. 4. ESRD - usual HDMWF.HD Monday.  5. HTN/volume - continues on clonidine patch and metoprolol 25 bid. New lower dry wt here about 69- 71kg. BP's well controlled.  6. Anemiaof CKD- SP 2 units of PRBCs 3/10.Darbe 100ug given here on 3/16, then lowered to 60ug weekly, next dose due 3/28. No IV Fe due to osteomyelitis. Hb down today post AKA to 7.7.  He can get prbc's tomorrow w/ HD, if needed let usKoreanow.  7. Secondary Hyperparathyroidism -Calcium and phosphorus controlled. Continue sensipar, binders, and hectorol 8. Hx Hep C /liver cirrhosis.       Patrick Anthony 10/31/2020, 12:38 PM   Recent Labs  Lab 10/30/20 0157 10/30/20 0739 10/31/20 0224 10/31/20 0741  K  6.4*   < > 4.0 4.2  BUN 51*  --  29*  --   CREATININE 8.77*  --  5.37*  --   CALCIUM 8.4*  --  8.5*  --   PHOS 5.3*  --  4.8*  --   HGB 8.1*  --  7.7*  --    < > = values in this interval not displayed.   Inpatient medications: . (feeding supplement) PROSource Plus  30 mL Oral BID BM  . aspirin EC  325 mg Oral Daily  . calcium acetate  667 mg Oral TID WC  . Chlorhexidine Gluconate Cloth  6 each Topical Q0600  . cinacalcet  60 mg Oral Q breakfast  . cloNIDine  0.3 mg Transdermal Weekly  . [START ON 11/01/2020] darbepoetin (ARANESP) injection - DIALYSIS  60 mcg Intravenous Q Mon-HD  . docusate sodium  100 mg Oral BID  . enoxaparin (LOVENOX) injection  30 mg Subcutaneous Q48H  . feeding supplement (NEPRO CARB STEADY)  237 mL Oral TID BM  . gabapentin  300 mg Oral QHS  . influenza vac split quadrivalent PF  0.5 mL Intramuscular Tomorrow-1000  . ketorolac  7.5 mg Intravenous Q12H  . metoprolol tartrate  50 mg Oral BID  . multivitamin  1 tablet Oral QHS  . oxymetazoline  1 spray Each Nare BID  .  pantoprazole  40 mg Oral Daily  . senna-docusate  1 tablet Oral BID  . sodium chloride flush  3 mL Intravenous Q12H   . sodium chloride    . sodium chloride 10 mL/hr at 10/29/20 1351  . sodium chloride    . methocarbamol (ROBAXIN) IV    . vancomycin 750 mg (10/27/20 2116)   sodium chloride, acetaminophen, bisacodyl, camphor-menthol, diphenhydrAMINE, HYDROmorphone (DILAUDID) injection, HYDROmorphone, magnesium citrate, methocarbamol **OR** methocarbamol (ROBAXIN) IV, metoCLOPramide **OR** metoCLOPramide (REGLAN) injection, ondansetron **OR** ondansetron (ZOFRAN) IV, oxyCODONE, polyethylene glycol, sodium chloride flush

## 2020-11-01 DIAGNOSIS — Z992 Dependence on renal dialysis: Secondary | ICD-10-CM | POA: Diagnosis not present

## 2020-11-01 DIAGNOSIS — D631 Anemia in chronic kidney disease: Secondary | ICD-10-CM | POA: Diagnosis not present

## 2020-11-01 DIAGNOSIS — I330 Acute and subacute infective endocarditis: Secondary | ICD-10-CM | POA: Diagnosis not present

## 2020-11-01 DIAGNOSIS — R0602 Shortness of breath: Secondary | ICD-10-CM | POA: Diagnosis not present

## 2020-11-01 DIAGNOSIS — I639 Cerebral infarction, unspecified: Secondary | ICD-10-CM | POA: Diagnosis not present

## 2020-11-01 DIAGNOSIS — I132 Hypertensive heart and chronic kidney disease with heart failure and with stage 5 chronic kidney disease, or end stage renal disease: Secondary | ICD-10-CM | POA: Diagnosis not present

## 2020-11-01 DIAGNOSIS — S78111A Complete traumatic amputation at level between right hip and knee, initial encounter: Secondary | ICD-10-CM

## 2020-11-01 DIAGNOSIS — A419 Sepsis, unspecified organism: Secondary | ICD-10-CM | POA: Diagnosis not present

## 2020-11-01 DIAGNOSIS — J189 Pneumonia, unspecified organism: Secondary | ICD-10-CM | POA: Diagnosis not present

## 2020-11-01 DIAGNOSIS — I5032 Chronic diastolic (congestive) heart failure: Secondary | ICD-10-CM | POA: Diagnosis not present

## 2020-11-01 DIAGNOSIS — N2581 Secondary hyperparathyroidism of renal origin: Secondary | ICD-10-CM | POA: Diagnosis not present

## 2020-11-01 DIAGNOSIS — R531 Weakness: Secondary | ICD-10-CM | POA: Diagnosis not present

## 2020-11-01 DIAGNOSIS — N186 End stage renal disease: Secondary | ICD-10-CM | POA: Diagnosis not present

## 2020-11-01 LAB — CBC WITH DIFFERENTIAL/PLATELET
Abs Immature Granulocytes: 0.12 10*3/uL — ABNORMAL HIGH (ref 0.00–0.07)
Basophils Absolute: 0 10*3/uL (ref 0.0–0.1)
Basophils Relative: 0 %
Eosinophils Absolute: 0.3 10*3/uL (ref 0.0–0.5)
Eosinophils Relative: 2 %
HCT: 21.9 % — ABNORMAL LOW (ref 39.0–52.0)
Hemoglobin: 7 g/dL — ABNORMAL LOW (ref 13.0–17.0)
Immature Granulocytes: 1 %
Lymphocytes Relative: 5 %
Lymphs Abs: 0.9 10*3/uL (ref 0.7–4.0)
MCH: 28.7 pg (ref 26.0–34.0)
MCHC: 32 g/dL (ref 30.0–36.0)
MCV: 89.8 fL (ref 80.0–100.0)
Monocytes Absolute: 2 10*3/uL — ABNORMAL HIGH (ref 0.1–1.0)
Monocytes Relative: 12 %
Neutro Abs: 14 10*3/uL — ABNORMAL HIGH (ref 1.7–7.7)
Neutrophils Relative %: 80 %
Platelets: 377 10*3/uL (ref 150–400)
RBC: 2.44 MIL/uL — ABNORMAL LOW (ref 4.22–5.81)
RDW: 17 % — ABNORMAL HIGH (ref 11.5–15.5)
WBC: 17.4 10*3/uL — ABNORMAL HIGH (ref 4.0–10.5)
nRBC: 0 % (ref 0.0–0.2)

## 2020-11-01 LAB — RENAL FUNCTION PANEL
Albumin: 2.1 g/dL — ABNORMAL LOW (ref 3.5–5.0)
Anion gap: 12 (ref 5–15)
BUN: 39 mg/dL — ABNORMAL HIGH (ref 6–20)
CO2: 26 mmol/L (ref 22–32)
Calcium: 8 mg/dL — ABNORMAL LOW (ref 8.9–10.3)
Chloride: 90 mmol/L — ABNORMAL LOW (ref 98–111)
Creatinine, Ser: 7.31 mg/dL — ABNORMAL HIGH (ref 0.61–1.24)
GFR, Estimated: 8 mL/min — ABNORMAL LOW (ref >60)
Glucose, Bld: 120 mg/dL — ABNORMAL HIGH (ref 70–99)
Phosphorus: 5.5 mg/dL — ABNORMAL HIGH (ref 2.5–4.6)
Potassium: 4.1 mmol/L (ref 3.5–5.1)
Sodium: 128 mmol/L — ABNORMAL LOW (ref 135–145)

## 2020-11-01 LAB — PREPARE RBC (CROSSMATCH)

## 2020-11-01 LAB — SURGICAL PATHOLOGY

## 2020-11-01 MED ORDER — VANCOMYCIN HCL IN DEXTROSE 750-5 MG/150ML-% IV SOLN
INTRAVENOUS | Status: AC
  Administered 2020-11-01: 750 mg via INTRAVENOUS
  Filled 2020-11-01: qty 150

## 2020-11-01 MED ORDER — SODIUM CHLORIDE 0.9% IV SOLUTION: Freq: Once | INTRAVENOUS | Status: AC

## 2020-11-01 MED ORDER — DARBEPOETIN ALFA 60 MCG/0.3ML IJ SOSY
PREFILLED_SYRINGE | INTRAMUSCULAR | Status: AC
  Filled 2020-11-01: qty 0.3

## 2020-11-01 NOTE — Progress Notes (Signed)
Inpatient Rehabilitation Admissions Coordinator  I met at bedside with patient and Dr. Letta Pate. He lives in a second floor apartment. He is functionally doing well, but a CIR admit will not remove the barrier of discharge home to second floor apartment with the need for outpatient hemodialysis. I recommended to patient to begin facilitating a long term move to first floor handicapped accessible apartment. SNF is recommended. I will alert TOC and acute team.  Danne Baxter, RN, MSN Rehab Admissions Coordinator [phone] 11/01/2020 11:50 AM

## 2020-11-01 NOTE — Progress Notes (Signed)
Mobility Specialist - Progress Note   11/01/20 1152  Mobility  Activity Ambulated in room;Ambulated to bathroom  Level of Assistance Standby assist, set-up cues, supervision of patient - no hands on  Assistive Device Front wheel walker  Distance Ambulated (ft) 80 ft (48 ft x 1, 16 ft x 2)  Mobility Response Tolerated well  Mobility performed by Mobility specialist  $Mobility charge 1 Mobility   Pt asx throughout ambulation. He ambulated 48 ft in room, then after a seated rest break, had to go to the bathroom. Pt back in bed after walk. HR remained in 90s throughout.   Pricilla Handler Mobility Specialist Mobility Specialist Phone: [phone]

## 2020-11-01 NOTE — Progress Notes (Signed)
[facility] Progress Note  Subjective: some pain today per patient. Comes and goes. No other complaints. Hgb 7.  Vitals:   10/31/20 2017 11/01/20 0007 11/01/20 0446 11/01/20 0749  BP: 104/80 117/82 128/78 124/81  Pulse: 91 95 90 90  Resp: '16 16 16 16  '$ Temp: 99.1 F (37.3 C) 98.3 F (36.8 C) 97.9 F (36.6 C) 98.1 F (36.7 C)  TempSrc: Oral Oral Oral Oral  SpO2: 98% 97% 99% 98%  Weight:   70 kg   Height:        Exam: GEN: wdwn, sitting in bed, nad ENT: no nasal discharge, mmm EYES: no scleral icterus, eomi CV: normal rate PULM: no iwob, bilateral chest rise ABD: NABS, non-distended SKIN: no rashes or jaundice EXT: no edema, R aka wrapped LUE AVG: +bruit    [facility] HD[facility]    4h 350/500 new edw here 69- 71kg (was82.5kg)2/2.5 bath LUA AVGHep 5000 -Aranesp 60mgIVq 2wks - last 09/27/20 (Verified order) -Hectorol358m IV qHD    Assessment/ Plan: 1. Anterior shin osteomyelitis: status post I&D per orthopedics. Pathology showed staph aureus in the tibia. 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 Continue w/ pain control 2. SP min invasive MVR(bioprosthetic)replacement - on 3/10. Doing well from cardiac standpoint 3. 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 Underwent tooth extraction3/8/22.PerID to get 6 wks IV vanc w/ end date of 11/25/20. 4. ESRD - usual HDMWF.Continue per schedule 5. HTN/volume - continues on clonidine patch and metoprolol 25 bid. New lower dry wt here about 69- 71kg. BP's well controlled.  6. Anemiaof CKD- SP 2 units of PRBCs 3/10.Darbe 100ug given here on 3/16, then lowered to 60ug weekly, next dose due today. PRBCs w/ HD today 7. Secondary Hyperparathyroidism -Calcium and phosphorus controlled. Continue sensipar, binders, and hectorol 8. Hx Hep C /liver cirrhosis.       [name]3/28/2022, 10:30 AM   Recent Labs  Lab 10/31/20 0224 10/31/20 0741  11/01/20 0100  K 4.0 4.2 4.1  BUN 29*  --  39*  CREATININE 5.37*  --  7.31*  CALCIUM 8.5*  --  8.0*  PHOS 4.8*  --  5.5*  HGB 7.7*  --  7.0*   Inpatient medications: . (feeding supplement) PROSource Plus  30 mL Oral BID BM  . aspirin EC  325 mg Oral Daily  . calcium acetate  667 mg Oral TID WC  . Chlorhexidine Gluconate Cloth  6 each Topical Q0600  . cinacalcet  60 mg Oral Q breakfast  . cloNIDine  0.3 mg Transdermal Weekly  . darbepoetin (ARANESP) injection - DIALYSIS  60 mcg Intravenous Q Mon-HD  . docusate sodium  100 mg Oral BID  . enoxaparin (LOVENOX) injection  30 mg Subcutaneous Q48H  . feeding supplement (NEPRO CARB STEADY)  237 mL Oral TID BM  . gabapentin  300 mg Oral QHS  .  HYDROmorphone (DILAUDID) injection  1-2 mg Intravenous Once in dialysis  . influenza vac split quadrivalent PF  0.5 mL Intramuscular Tomorrow-1000  . metoprolol tartrate  50 mg Oral BID  . multivitamin  1 tablet Oral QHS  . oxymetazoline  1 spray Each Nare BID  . pantoprazole  40 mg Oral Daily  . senna-docusate  1 tablet Oral BID  . sodium chloride flush  3 mL Intravenous Q12H   . sodium chloride    . sodium chloride 10 mL/hr at 10/29/20 1351  . sodium chloride    . methocarbamol (  ROBAXIN) IV    . vancomycin 750 mg (10/27/20 2116)   sodium chloride, acetaminophen, bisacodyl, camphor-menthol, diphenhydrAMINE, HYDROmorphone (DILAUDID) injection, HYDROmorphone, magnesium citrate, methocarbamol **OR** methocarbamol (ROBAXIN) IV, metoCLOPramide **OR** metoCLOPramide (REGLAN) injection, ondansetron **OR** ondansetron (ZOFRAN) IV, oxyCODONE, polyethylene glycol, sodium chloride flush

## 2020-11-01 NOTE — Progress Notes (Signed)
Inpatient Rehab Admissions Coordinator Note:   Per therapy recommendations, pt was screened for CIR candidacy by Shann Medal, PT, DPT.  At this time we are recommending a CIR consult and I will place an order per our protocol.  Please contact me with questions.   Shann Medal, PT, DPT [phone] 11/01/20 9:33 AM

## 2020-11-01 NOTE — Progress Notes (Signed)
PROGRESS NOTE    Patrick Anthony  [id number] DOB: [birth year] DOA: 09/27/2020 PCP: [name], MD   Chief Complaint  Patient presents with  . 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  Seen by ID managed with IV antibiotic, CT surgery consult salt was obtained subsequently underwent minimally invasive mitral valve replacement on 10/14/2020 and was managed under CT surgery service and subsequently transferred to [facility] on 3/16.  Patient was followed by ID, orthopedics.  He was found to have swelling on the right leg and seen by orthopedic underwent debridement on the right leg and treated for large complicated abscess with osteomyelitis involving his tibia with 7 by X.3 centimeter area of bone that was excised. Patient was seen by Dr. Sharol Given orthopedic ID and the right above-the-knee amputation has been recommended moving forward to manage this infection. Patient has been told by multiple providers multiple times the need for the above-the-knee amputation to avoid seeding of his new valve and his graft but has been reluctant and he is aware about dying from staph aureus bacteremia and sepsis. 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 Afebrile overnight H/h down to 7.0 Patient reports his pain is controlled.  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 Sharol Given. Cont vancomycin w/ HD- duration 3/10>11/25/2020, per ID. Repeat blood cultures from 10/13/20 are negative. Patient is status post right TKA 10/29/20-although he had refused initially. Continue on current pain management w/ needed oxycodone Dilaudid po/iv, gabapentin and Robaxin.  Off Toradol due to his ESRD status.  Status post right TKA continue pain management and wound care as above per Dr. Sharol Given  ESRD and HD MWF: Status post HD 3/26.  Management as per nephrology and appreciate input Hyperkalemia: s/p Lokelma, HD-potassium is normal Recent Labs  Lab 10/30/20 1601 10/30/20 1939 10/31/20 0224 10/31/20 0741 11/01/20 0100  K 4.2 4.1 4.0 4.2 4.1   Secondary hyperparathyroidism-calcium and phosphorus stable continue Sensipar binders and Hectorol per nephrology.  Monitor lab  Anemia of chronic kidney disease plus anemia of acute blood loss in the setting of AKA, Status post 2 unit PRBC so far-Hb appears to be downtrending further -will benefit with 1 unit PRBC transfusion during dialysis today-I discussed with nephrologist.  Recent Labs  Lab 10/28/20 0152 10/29/20 0138 10/30/20 0157 10/31/20 0224 11/01/20 0100  HGB 10.0* 8.5* 8.1* 7.7* 7.0*  HCT 32.3* 27.7* 26.3* 25.3* 21.9*   History of hepatitis C/liver cirrhosis-outpatient follow-up with ID.Monitor LFTs intermittently.  Monitor labs. Recent Labs  Lab 10/28/20 0152 10/29/20 0138 10/30/20 0157 10/31/20 0224 11/01/20 0100  ALBUMIN 2.2* 2.1* 2.4* 2.3* 2.1*   Hyponatremia level fluctuating:cot HD,monitor labs.  Hypertension well-controlled on metoprolol and clonidine  patch.  Nasal bleeding on 3/20 resolved.  Diet Order            Diet renal with  fluid restriction Fluid restriction: 1200 mL Fluid; Room service appropriate? Yes; Fluid consistency: Thin  Diet effective now                 Nutrition Problem: Increased nutrient needs Etiology: chronic illness (ESRD on HD) Signs/Symptoms: estimated needs Interventions: Nepro shake,Refer to RD note for recommendations Patient's Body mass index is 20.92 kg/m.  DVT prophylaxis: SCDs Start: 10/29/20 1639 SCDs Start: 10/20/20 1721 enoxaparin (LOVENOX) injection 30 mg Start: 10/19/20 0800 SCDs Start: 10/15/20 0610 Code Status:   Code Status: Full Code  Family Communication:plan of care discussed with patient at bedside. I had discussed with [name] extensively 3/23  Status [id number] Remains inpatient appropriate because:IV treatments appropriate due to intensity of illness or inability to take PO and Inpatient level of care appropriate due to severity of illness  Dispo:The patient is from:Home            Anticipated d/c is to:[facility], hopefully  In 1-2 days            Patient currently is not medically stable to d/c.            Difficult to place patient No  Unresulted Labs (From admission, onward)          Start     Ordered   10/21/20 0500  Renal function panel  Daily,   R     Question:  Specimen collection method  Answer:  Lab=Lab collect   10/20/20 2128   10/21/20 0500  CBC with Differential/Platelet  Daily,   R     Question:  Specimen collection method  Answer:  Lab=Lab collect   10/20/20 2128   Signed and Held  Renal function panel  Once,   R       Question:  Specimen collection method  Answer:  Lab=Lab collect   Signed and Held         Medications reviewed:  Scheduled Meds: . (feeding supplement) PROSource Plus  30 mL Oral BID BM  . aspirin EC  325 mg Oral Daily  . calcium acetate  667 mg Oral TID WC  . Chlorhexidine Gluconate Cloth  6 each Topical Q0600  . cinacalcet  60 mg Oral Q breakfast  . cloNIDine  0.3 mg Transdermal Weekly  . darbepoetin  (ARANESP) injection - DIALYSIS  60 mcg Intravenous Q Mon-HD  . docusate sodium  100 mg Oral BID  . enoxaparin (LOVENOX) injection  30 mg Subcutaneous Q48H  . feeding supplement (NEPRO CARB STEADY)  237 mL Oral TID BM  . gabapentin  300 mg Oral QHS  .  HYDROmorphone (DILAUDID) injection  1-2 mg Intravenous Once in dialysis  . influenza vac split quadrivalent PF  0.5 mL Intramuscular Tomorrow-1000  . metoprolol tartrate  50 mg Oral BID  . multivitamin  1 tablet Oral QHS  . oxymetazoline  1 spray Each Nare BID  . pantoprazole  40 mg Oral Daily  . senna-docusate  1 tablet Oral BID  . sodium chloride flush  3 mL Intravenous Q12H   Continuous Infusions: . sodium chloride    . sodium chloride 10 mL/hr at 10/29/20 1351  . sodium chloride    . methocarbamol (ROBAXIN) IV    . vancomycin 750 mg (10/27/20 2116)   Consultants:see note  Procedures:see note  Antimicrobials: Anti-infectives (From admission, onward)  Start     Dose/Rate Route Frequency Ordered Stop   10/30/20 1100  vancomycin (VANCOCIN) IVPB 750 mg/150 ml premix        750 mg 150 mL/hr over 60 Minutes Intravenous Once in dialysis 10/30/20 0833 10/30/20 1317   10/29/20 1000  ceFAZolin (ANCEF) IVPB 2g/100 mL premix  Status:  Discontinued        2 g 200 mL/hr over 30 Minutes Intravenous To Short Stay 10/29/20 0730 10/29/20 1634   10/27/20 2015  vancomycin (VANCOCIN) IVPB 750 mg/150 ml premix        750 mg 150 mL/hr over 60 Minutes Intravenous Every M-W-F (Hemodialysis) 10/27/20 2005     10/25/20 0922  vancomycin (VANCOCIN) 500-5 MG/100ML-% IVPB       Note to Pharmacy: Kalman Shan   : cabinet override      10/25/20 0922 10/25/20 0958   10/21/20 0600  ceFAZolin (ANCEF) IVPB 2g/100 mL premix        2 g 200 mL/hr over 30 Minutes Intravenous On call to O.R. 10/20/20 1349 10/21/20 0650   10/20/20 2243  vancomycin (VANCOCIN) 500-5 MG/100ML-% IVPB       Note to Pharmacy: Wallace Cullens   : cabinet override      10/20/20 2243  10/20/20 2243   10/20/20 1437  ceFAZolin (ANCEF) 2-4 GM/100ML-% IVPB       Note to Pharmacy: Ladoris Gene   : cabinet override      10/20/20 1437 10/20/20 1612   10/20/20 1200  vancomycin (VANCOREADY) IVPB 500 mg/100 mL  Status:  Discontinued        500 mg 100 mL/hr over 60 Minutes Intravenous Every M-W-F (Hemodialysis) 10/20/20 0903 10/27/20 2005   10/18/20 1124  vancomycin variable dose per unstable renal function (pharmacist dosing)  Status:  Discontinued         Does not apply See admin instructions 10/18/20 1124 10/20/20 0903   10/16/20 1600  vancomycin (VANCOREADY) IVPB 500 mg/100 mL  Status:  Discontinued        500 mg 100 mL/hr over 60 Minutes Intravenous Every Sat (Hemodialysis) 10/16/20 1306 10/16/20 1312   10/15/20 1500  vancomycin (VANCOCIN) IVPB 500 mg/100 ml premix  Status:  Discontinued        500 mg 100 mL/hr over 60 Minutes Intravenous Every M-W-F (Hemodialysis) 10/15/20 1357 10/17/20 0942   10/14/20 2130  vancomycin (VANCOCIN) IVPB 1000 mg/200 mL premix  Status:  Discontinued        1,000 mg 200 mL/hr over 60 Minutes Intravenous  Once 10/14/20 1403 10/14/20 1547   10/14/20 2130  vancomycin (VANCOREADY) IVPB 500 mg/100 mL        500 mg 100 mL/hr over 60 Minutes Intravenous  Once 10/14/20 1547 10/14/20 2222   10/14/20 1730  cefUROXime (ZINACEF) 1.5 g in sodium chloride 0.9 % 100 mL IVPB        1.5 g 200 mL/hr over 30 Minutes Intravenous Every 24 hr x 2 10/14/20 1403 10/15/20 1718   10/14/20 1128  vancomycin (VANCOCIN) 1,000 mg in sodium chloride 0.9 % 1,000 mL irrigation  Status:  Discontinued          As needed 10/14/20 1128 10/14/20 1401   10/14/20 0400  vancomycin (VANCOREADY) IVPB 1250 mg/250 mL        1,250 mg 166.7 mL/hr over 90 Minutes Intravenous To Surgery 10/13/20 1039 10/14/20 0910   10/14/20 0400  cefUROXime (ZINACEF) 1.5 g in sodium chloride 0.9 % 100 mL IVPB  1.5 g 200 mL/hr over 30 Minutes Intravenous To Surgery 10/13/20 1039 10/14/20 0835    10/14/20 0400  cefUROXime (ZINACEF) 750 mg in sodium chloride 0.9 % 100 mL IVPB        750 mg 200 mL/hr over 30 Minutes Intravenous To Surgery 10/13/20 1039 10/14/20 1250   10/14/20 0400  vancomycin (VANCOCIN) 1,000 mg in sodium chloride 0.9 % 1,000 mL irrigation  Status:  Discontinued         Irrigation To Surgery 10/13/20 1039 10/14/20 1403   10/13/20 1657  vancomycin (VANCOCIN) 500-5 MG/100ML-% IVPB       Note to Pharmacy: Judieth Keens  : cabinet override      10/13/20 1657 10/13/20 1717   10/12/20 0600  ceFAZolin (ANCEF) IVPB 2g/100 mL premix        2 g 200 mL/hr over 30 Minutes Intravenous On call to O.R. 10/11/20 1841 10/12/20 1315   10/11/20 1415  vancomycin (VANCOCIN) IVPB 500 mg/100 ml premix  Status:  Discontinued        500 mg 100 mL/hr over 60 Minutes Intravenous Every M-W-F (Hemodialysis) 10/11/20 1413 10/14/20 1403   10/11/20 1413  vancomycin (VANCOCIN) 500-5 MG/100ML-% IVPB       Note to Pharmacy: Kalman Shan   : cabinet override      10/11/20 1413 10/12/20 0229   10/11/20 1353  Vancomycin (VANCOCIN) 750-5 MG/150ML-% IVPB  Status:  Discontinued       Note to Pharmacy: Kalman Shan   : cabinet override      10/11/20 1353 10/11/20 1417   10/08/20 1200  vancomycin (VANCOCIN) IVPB 750 mg/150 ml premix  Status:  Discontinued        750 mg 150 mL/hr over 60 Minutes Intravenous Every M-W-F (Hemodialysis) 10/05/20 1404 10/11/20 1413   10/06/20 1619  vancomycin (VANCOCIN) 500-5 MG/100ML-% IVPB       Note to Pharmacy: Judieth Keens  : cabinet override      10/06/20 1619 10/06/20 1736   10/06/20 1200  vancomycin (VANCOCIN) IVPB 750 mg/150 ml premix  Status:  Discontinued        750 mg 150 mL/hr over 60 Minutes Intravenous Every M-W-F (Hemodialysis) 10/04/20 0905 10/05/20 1404   10/06/20 1200  vancomycin (VANCOCIN) IVPB 500 mg/100 ml premix        500 mg 100 mL/hr over 60 Minutes Intravenous Every Wed (Hemodialysis) 10/05/20 1404 10/06/20 2000   10/01/20 2000   DAPTOmycin (CUBICIN) 840 mg in sodium chloride 0.9 % IVPB  Status:  Discontinued        840 mg 233.6 mL/hr over 30 Minutes Intravenous Every 48 hours 10/01/20 0922 10/01/20 1056   10/01/20 1200  vancomycin (VANCOCIN) IVPB 1000 mg/200 mL premix  Status:  Discontinued        1,000 mg 200 mL/hr over 60 Minutes Intravenous Every M-W-F (Hemodialysis) 10/01/20 1056 10/04/20 0845   09/29/20 1200  vancomycin (VANCOCIN) IVPB 1000 mg/200 mL premix  Status:  Discontinued        1,000 mg 200 mL/hr over 60 Minutes Intravenous Every M-W-F (Hemodialysis) 09/27/20 1443 10/01/20 0922   09/29/20 1200  ceFEPIme (MAXIPIME) 2 g in sodium chloride 0.9 % 100 mL IVPB  Status:  Discontinued        2 g 200 mL/hr over 30 Minutes Intravenous Every M-W-F (Hemodialysis) 09/27/20 1443 09/28/20 1054   09/28/20 0900  metroNIDAZOLE (FLAGYL) IVPB 500 mg  Status:  Discontinued        500 mg 100 mL/hr over  60 Minutes Intravenous Every 12 hours 09/27/20 1808 09/28/20 1054   09/28/20 0900  ceFEPIme (MAXIPIME) 1 g in sodium chloride 0.9 % 100 mL IVPB  Status:  Discontinued        1 g 200 mL/hr over 30 Minutes Intravenous Every 12 hours 09/27/20 1808 09/27/20 1812   09/27/20 1445  vancomycin (VANCOREADY) IVPB 2000 mg/400 mL        2,000 mg 200 mL/hr over 120 Minutes Intravenous  Once 09/27/20 1433 09/27/20 2330   09/27/20 1430  ceFEPIme (MAXIPIME) 2 g in sodium chloride 0.9 % 100 mL IVPB        2 g 200 mL/hr over 30 Minutes Intravenous  Once 09/27/20 1418 09/27/20 1541   09/27/20 1430  metroNIDAZOLE (FLAGYL) IVPB 500 mg        500 mg 100 mL/hr over 60 Minutes Intravenous  Once 09/27/20 1418 09/27/20 2214   09/27/20 1430  vancomycin (VANCOREADY) IVPB 1750 mg/350 mL  Status:  Discontinued        1,750 mg 175 mL/hr over 120 Minutes Intravenous  Once 09/27/20 1418 09/27/20 1433     Culture/Microbiology    Component Value Date/Time   SDES TISSUE 10/20/2020 1612   SPECREQUEST RIGHT LEG ABSC SPEC A 10/20/2020 1612   CULT   10/20/2020 1612    FEW METHICILLIN RESISTANT STAPHYLOCOCCUS AUREUS CRITICAL RESULT CALLED TO, READ BACK BY AND VERIFIED WITH: RN N.CROSFON AT 0954 ON 10/22/2020 BY T.SAAD NO ANAEROBES ISOLATED Performed at [facility], [place] [address], [place], [place] [address]    REPTSTATUS 10/26/2020 FINAL 10/20/2020 1612    Other culture-see note  Objective: Vitals: Today's Vitals   11/01/20 0334 11/01/20 0446 11/01/20 0703 11/01/20 0749  BP:  128/78  124/81  Pulse:  90  90  Resp:  16  16  Temp:  97.9 F (36.6 C)  98.1 F (36.7 C)  TempSrc:  Oral  Oral  SpO2:  99%  98%  Weight:  70 kg    Height:      PainSc: Asleep  6      Intake/Output Summary (Last 24 hours) at 11/01/2020 0811 Last data filed at 10/31/2020 2041 Gross per 24 hour  Intake 237 ml  Output --  Net 237 ml   Filed Weights   10/30/20 1213 10/31/20 0436 11/01/20 0446  Weight: 69.8 kg 69.8 kg 70 kg   Weight change: -3.51 kg  Intake/Output from previous day: 03/27 0701 - 03/28 0700 In: 237 [NG/GT:237] Out: -  Intake/Output this shift: No intake/output data recorded. Filed Weights   10/30/20 1213 10/31/20 0436 11/01/20 0446  Weight: 69.8 kg 69.8 kg 70 kg   Examination: General exam: AAOx3 , NAD, weak appearing. HEENT:Oral mucosa moist, Ear/Nose WNL grossly, dentition normal. Respiratory system: bilaterally clear,no wheezing or crackles,no use of accessory muscle. Cardiovascular system: S1 & S2 +, No JVD. Gastrointestinal system: Abdomen soft, NT,ND, BS+. Nervous System:Alert, awake, moving extremities and grossly nonfocal. Extremities: Right AKA stump with wound VAC in place no edema, distal peripheral pulses palpable.  Skin:No rashes,no icterus. [id number] muscle bulk,tone, power.  Data Reviewed: I have personally reviewed following labs and imaging studies CBC: Recent Labs  Lab 10/28/20 0152 10/29/20 0138 10/30/20 0157 10/31/20 0224 11/01/20 0100  WBC 10.9* 10.0 21.8* 15.9* 17.4*  NEUTROABS 8.0*  6.8 19.2* 11.2* 14.0*  HGB 10.0* 8.5* 8.1* 7.7* 7.0*  HCT 32.3* 27.7* 26.3* 25.3* 21.9*  MCV 90.0 91.1 90.4 90.7 89.8  PLT 415* 389 440* 360 377  Basic Metabolic Panel: Recent Labs  Lab 10/28/20 0152 10/29/20 0138 10/30/20 0157 10/30/20 0739 10/30/20 1601 10/30/20 1939 10/31/20 0224 10/31/20 0741 11/01/20 0100  NA 132* 130* 128*  --   --   --  131*  --  128*  K 3.9 4.1 6.4*   < > 4.2 4.1 4.0 4.2 4.1  CL 95* 93* 93*  --   --   --  91*  --  90*  CO2 '26 25 22  '$ --   --   --  28  --  26  GLUCOSE 115* 111* 98  --   --   --  97  --  120*  BUN 22* 38* 51*  --   --   --  29*  --  39*  CREATININE 4.75* 7.11* 8.77*  --   --   --  5.37*  --  7.31*  CALCIUM 8.5* 8.6* 8.4*  --   --   --  8.5*  --  8.0*  PHOS 3.2 4.7* 5.3*  --   --   --  4.8*  --  5.5*   < > = values in this interval not displayed.   GFR: Estimated Creatinine Clearance: 12.1 mL/min (A) (by C-G formula based on SCr of 7.31 mg/dL (H)). Liver Function Tests: Recent Labs  Lab 10/28/20 0152 10/29/20 0138 10/30/20 0157 10/31/20 0224 11/01/20 0100  ALBUMIN 2.2* 2.1* 2.4* 2.3* 2.1*   No results for input(s): LIPASE, AMYLASE in the last 168 hours. No results for input(s): AMMONIA in the last 168 hours. Coagulation Profile: No results for input(s): INR, PROTIME in the last 168 hours. Cardiac Enzymes: No results for input(s): CKTOTAL, CKMB, CKMBINDEX, TROPONINI in the last 168 hours. BNP (last 3 results) No results for input(s): PROBNP in the last 8760 hours. HbA1C: No results for input(s): HGBA1C in the last 72 hours. CBG: No results for input(s): GLUCAP in the last 168 hours. Lipid Profile: No results for input(s): CHOL, HDL, LDLCALC, TRIG, CHOLHDL, LDLDIRECT in the last 72 hours. Thyroid Function Tests: No results for input(s): TSH, T4TOTAL, FREET4, T3FREE, THYROIDAB in the last 72 hours. Anemia Panel: No results for input(s): VITAMINB12, FOLATE, FERRITIN, TIBC, IRON, RETICCTPCT in the last 72 hours. Sepsis  Labs: No results for input(s): PROCALCITON, LATICACIDVEN in the last 168 hours.  No results found for this or any previous visit (from the past 240 hour(s)). Radiology Studies: No results found.  LOS: 35 days   Antonieta Pert, MD [facility]  11/01/2020, 8:11 AM

## 2020-11-01 NOTE — TOC Progression Note (Signed)
Transition of Care [facility]) - Progression Note    Patient Details  Name: [name] MRN: [id number] Date of Birth: 06/24/1971  Transition of Care [facility]) CM/SW [place], [place] Phone Number: 11/01/2020, 1:20 PM  Clinical Narrative:     CSW re-sent SNF referrals- patient has not bed offers at this time.  Thurmond Butts, MSW, LCSW Clinical Social Worker   Expected Discharge Plan: Skilled Nursing Facility Barriers to Discharge: Continued Medical Work up,SNF Pending bed offer  Expected Discharge Plan and Services Expected Discharge Plan: [place] In-house Referral: Clinical Social Work Discharge Planning Services: CM Consult Post Acute Care Choice: [place] Living arrangements for the past 2 months: Apartment                                       Social Determinants of Health (SDOH) Interventions    Readmission Risk Interventions No flowsheet data found.

## 2020-11-01 NOTE — Consult Note (Signed)
Physical Medicine and Rehabilitation Consult Reason for Consult: Word finding difficulty with gait abnormality Referring Physician: Dr.Ramesh   HPI: Patrick Anthony is a 50 y.o. right-handed male with history significant for positive COVID-19 2019, end-stage renal disease due to adult polycystic kidney disease with hemodialysis Monday Wednesday Friday and left upper extremity AV graft, polysubstance abuse, hypertension, struct of sleep apnea, chronic congestive heart failure with preserved ejection fraction, hepatitis C with liver cirrhosis, alcohol abuse.  Per chart review patient lives alone.  1 level apartment.  Reportedly independent prior to admission and drives himself to dialysis.  Presented 09/27/2020 with fever, chills and generalized weakness that progressed over 10 days.  Patient did note some mild nausea and abdominal discomfort.  Noted the ED fever of 103.2, blood pressure 142/108 and tachycardic 143.  Oxygen saturations 100%.  Chemistries creatinine 10.3 lactic acid 3.5 noted mildly elevation of AST 42.  Chest x-ray possible opacity left lower lung base concerning for pneumonia.  CT abdomen pelvis revealed no acute findings.  Placed on broad-spectrum antibiotics.  CT/MRI showed scattered small acute infarcts within the bilateral cerebellar hemispheres, right basal ganglia, colossal splenium and bilateral cerebellar hemispheres.  Multiple vascular territories involved findings suspicious for embolic process.  MRA of the head no large vessel occlusion.  Echocardiogram with ejection fraction of 55 to 60% no wall motion abnormalities grade 2 diastolic dysfunction.  EEG consistent with toxic metabolic encephalopathy.  There was one episode of right arm tremor noted at 1528 during which EEG showed generalized periodic discharges with triphasic morphology.  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  CT surgery consulted obtained and underwent minimally invasive mitral valve replacement 10/14/2020.  Patient would remain on IV antibiotics through 11/25/2020.  Latest blood cultures no growth to date.  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  Patient remains on hemodialysis as directed with follow-up renal services.  Anemia of chronic disease and latest hemoglobin 7.0.  Patient was cleared to begin aspirin therapy for CVA prophylaxis.  Therapy evaluations completed due to patient decreased functional ability recommendations of physical medicine rehab consult.  Patient lives in a second floor studio apartment without elevator access.  This is  subsidized housing  Review of Systems  Constitutional: Positive for chills and fever.  HENT: Negative for hearing loss.   Eyes: Negative for blurred vision and double vision.  Respiratory: Positive for shortness of breath. Negative for cough.   Cardiovascular: Positive for palpitations and leg swelling. Negative for chest pain.  Gastrointestinal: Positive for constipation. Negative for heartburn, nausea and vomiting.  Genitourinary: Negative for dysuria, flank pain and hematuria.  Musculoskeletal: Positive for myalgias.  Skin: Negative for rash.  All other systems reviewed and are negative.  Past Medical History:  Diagnosis Date  . 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 Chronic kidney disease    patient on dialysis tues-thurs-sat  . Cirrhosis of liver ([place])   . Drug addiction ([place])   . ETOH abuse   . Hepatitis   .  Hypertension   . Mitral  regurgitation   . Patent foramen ovale 10/23/2019  . Renal disorder renal failure  . S/P minimally-invasive mitral valve replacement with bioprosthetic valve 10/14/2020   31 mm Medtronic Mosaic stented porcine bioprosthetic tissue valve via right mini thoracotomy approach  . Sleep apnea   . Systolic and diastolic CHF, 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[place]) 04/19/2013   Past Surgical History:  Procedure Laterality Date  . AMPUTATION Right 10/29/2020   Procedure: RIGHT ABOVE KNEE AMPUTATION;  Surgeon: [name], MD;  Location: [place];  Service: Orthopedics;  Laterality: Right;  . AV FISTULA PLACEMENT Right   . AV FISTULA PLACEMENT Left 09/03/2013   Procedure: ARTERIOVENOUS (AV) FISTULA CREATION - LEFT BRACHIOCEPHALIC;  Surgeon: Conrad Silver Creek, MD;  Location: [facility] OR;  Service: Vascular;  Laterality: Left;  . AV FISTULA PLACEMENT Left 10/30/2019   Procedure: INSERTION OF ARTERIOVENOUS (AV) GORE-TEX GRAFT ARM;  Surgeon: Rosetta Posner, MD;  Location: [place];  Service: Vascular;  Laterality: Left;  . BUBBLE STUDY  10/05/2020   Procedure: BUBBLE STUDY;  Surgeon: Pixie Casino, MD;  Location: [place];  Service: Cardiovascular;;  . I & D EXTREMITY Right 10/20/2020   Procedure: IRRIGATION AND DEBRIDEMENT OF LEG;  Surgeon: [name], MD;  Location: [place];  Service: Orthopedics;  Laterality: Right;  . INSERTION OF DIALYSIS CATHETER N/A 08/29/2013   Procedure: INSERTION OF DIALYSIS CATHETER;  Surgeon: Mal Misty, MD;  Location: [place];  Service: Vascular;  Laterality: N/A;  . INSERTION OF DIALYSIS CATHETER Left 10/30/2019   Procedure: INSERTION OF DIALYSIS CATHETER LEFT INTERNAL JUGULAR;  Surgeon: Rosetta Posner, MD;  Location: [place];  Service: Vascular;  Laterality: Left;  . LEFT HEART CATH AND CORONARY ANGIOGRAPHY N/A 10/07/2020   Procedure: LEFT HEART CATH AND CORONARY ANGIOGRAPHY;  Surgeon: Lorretta Harp, MD;  Location: [place] [facility];  Service: Cardiovascular;  Laterality: N/A;  . MITRAL VALVE REPAIR  Right 10/14/2020   Procedure: MINIMALLY INVASIVE MITRAL VALVE  REPLACEMENT(MVR) USING MEDTRONIC MOSAIC VALVE SIZE 31MM;  Surgeon: Rexene Alberts, MD;  Location: [place];  Service: Open Heart Surgery;  Laterality: Right;  . MULTIPLE EXTRACTIONS WITH ALVEOLOPLASTY N/A 10/12/2020   Procedure: MULTIPLE EXTRACTION WITH ALVEOLOPLASTY;  Surgeon: [name], DMD;  Location: [place];  Service: Dentistry;  Laterality: N/A;  . PERIPHERAL VASCULAR CATHETERIZATION N/A 01/06/2015   Procedure: A/V Shuntogram/Fistulagram;  Surgeon: Katha Cabal, MD;  Location: [place] CV LAB;  Service: Cardiovascular;  Laterality: N/A;  . PERIPHERAL VASCULAR CATHETERIZATION Left 01/06/2015   Procedure: A/V Shunt Intervention;  Surgeon: Katha Cabal, MD;  Location: [place] CV LAB;  Service: Cardiovascular;  Laterality: Left;  . REPAIR OF PATENT FORAMEN OVALE N/A 10/14/2020   Procedure: CLOSURE OF PATENT FORAMEN OVALE;  Surgeon: Rexene Alberts, MD;  Location: [place];  Service: Open Heart Surgery;  Laterality: N/A;  . TEE WITHOUT CARDIOVERSION N/A 10/05/2020   Procedure: TRANSESOPHAGEAL ECHOCARDIOGRAM (TEE);  Surgeon: Pixie Casino, MD;  Location: [place];  Service: Cardiovascular;  Laterality: N/A;  . TEE WITHOUT CARDIOVERSION N/A 10/14/2020   Procedure: TRANSESOPHAGEAL ECHOCARDIOGRAM (TEE);  Surgeon: Rexene Alberts, MD;  Location: [place];  Service: Open Heart Surgery;  Laterality: N/A;   Family History  Problem Relation Age of Onset  . Hypertension Mother   . Cancer Mother        breast   Social History:  reports that he quit smoking about 3 years ago.  His smoking use included cigarettes. He smoked 0.20 packs per day. He has never used smokeless tobacco. He reports that he does not drink alcohol and does not use drugs. Allergies: No Known Allergies Medications Prior to Admission  Medication Sig Dispense Refill  . amLODipine (NORVASC) 10 MG tablet Take 10 mg by mouth every morning.    Lorin Picket 1 GM  210 MG(Fe) tablet Take 420 mg by mouth 3 (three) times daily.    . calcium acetate (PHOSLO) 667 MG capsule Take 3 capsules (2,001 mg total) by mouth 3 (three) times daily with meals. (Patient taking differently: Take 667-2,001 mg by mouth See admin instructions. Take 3 capsules (2001 mg) by mouth with each meal & take 1 capsule (667 mg) by mouth with each snack) 270 capsule 5  . cloNIDine (CATAPRES) 0.3 MG tablet Take 0.3 mg by mouth in the morning, at noon, and at bedtime.    . Darbepoetin Alfa (ARANESP, ALBUMIN FREE, IJ) Darbepoetin Alfa (Aranesp)    . gabapentin (NEURONTIN) 100 MG capsule Take 200 mg by mouth at bedtime.    . heparin 1000 unit/mL SOLN injection Heparin Sodium (Porcine) 1,000 Units/mL Systemic    . hydrALAZINE (APRESOLINE) 100 MG tablet Take 100 mg by mouth 2 (two) times daily.    . metoprolol tartrate (LOPRESSOR) 25 MG tablet Take 75 mg by mouth 2 (two) times daily.   3  . nitroGLYCERIN (NITROSTAT) 0.4 MG SL tablet Place 0.4 mg under the tongue every 5 (five) minutes x 3 doses as needed for chest pain.     . SENSIPAR 30 MG tablet Take 30 mg by mouth daily.    Marland Kitchen VIAGRA 50 MG tablet Take 50 mg by mouth daily as needed for erectile dysfunction.     . [EXPIRED] oxyCODONE-acetaminophen (PERCOCET) 7.5-325 MG tablet Take 1 tablet by mouth every 4 (four) hours as needed for severe pain. (Patient not taking: No sig reported) 20 tablet 0    Home: Home Living Family/patient expects to be discharged to:: Private residence Living Arrangements: Alone Available Help at Discharge: Other (Comment) Type of Home: Apartment Home Access: Stairs to enter Entrance Stairs-Number of Steps: flight Entrance Stairs-Rails: Right,Left,Can reach both Home Layout: One level Bathroom Shower/Tub: Optometrist: Yes Home Equipment: Shower seat - built in Additional Comments: Pt states he has a handicapped accessible tub that can convert into a shower   Functional History: Prior Function Level of Independence: Independent Comments: likes to fish, doesn't work, drives himself to HD Functional Status:  Mobility: Bed Mobility Overal bed mobility: Modified Independent Bed Mobility: Rolling,Supine to Sit Rolling: Modified independent (Device/Increase time) Supine to sit: Modified independent (Device/Increase time) Sit to supine: Min assist General bed mobility comments: not assessed Transfers Overall transfer level: Needs assistance Equipment used: Rolling walker (2 wheeled) Transfers: Sit to/from Stand Sit to Stand: Min guard Stand pivot transfers: Min guard General transfer comment: min guard for sit>stand from EOB using RW, ambulated from EOB>BSC across room>sink>chair using RW for support Ambulation/Gait Ambulation/Gait assistance: Min guard Gait Distance (Feet): 50 Feet Assistive device: Rolling walker (2 wheeled) Gait Pattern/deviations:  (hop-to gait) General Gait Details: pt with short hop-to gait, reduced foot clearance with fatigue Gait velocity: decreased Gait velocity interpretation: <1.31 ft/sec, indicative of household ambulator Stairs: Yes Stairs assistance: Min guard Stair Management: Two rails,Alternating pattern,Step to pattern,Forwards Number of Stairs: 10 General stair comments: pt ignoring therapist cues for step-to pattern as he is having moderate RLE pain but no  overt LOB with reciprocal pattern when ascending; slightly less stable when descending and tending to lead with LLE despite cues to lead with RLE. after 5 steps descended, switched to RLE descending but reports minimal difference in pain    ADL: ADL Overall ADL's : Needs assistance/impaired Eating/Feeding: Independent Grooming: Wash/dry hands,Wash/dry face,Oral care,Supervision/safety,Min guard,Standing Grooming Details (indicate cue type and reason): standing at sink with RW Upper Body Bathing: Modified independent,Sitting Lower Body Bathing:  Min guard,Supervison/ safety,Sitting/lateral leans,Sit to/from stand Lower Body Bathing Details (indicate cue type and reason): using RW for support, educated about long handled sponge Upper Body Dressing : Modified independent,Sitting Upper Body Dressing Details (indicate cue type and reason): to don posterior gown in sitting Lower Body Dressing: Min guard,Minimal assistance,Sitting/lateral leans,Sit to/from stand Lower Body Dressing Details (indicate cue type and reason): able to manage L sock sitting EOB, anticipate min assist-min guard for underwear/pants management using RW for support Toilet Transfer: Diplomatic Services operational officer Details (indicate cue type and reason): assist with wound vac management Toileting- Clothing Manipulation and Hygiene: Modified independent,Sitting/lateral lean Toileting - Clothing Manipulation Details (indicate cue type and reason): simulated sitting EOB Tub/ Shower Transfer:  (not assessed today) Functional mobility during ADLs: Min guard,Rolling walker General ADL Comments: provided education about AE/DME, activity pacing, PLB, adaptive ADL strategies, and safety awareness  Cognition: Cognition Overall Cognitive Status: Within Functional Limits for tasks assessed Arousal/Alertness: Awake/alert Orientation Level: Oriented X4 Attention: Sustained,Focused Focused Attention: Appears intact Sustained Attention: Impaired Sustained Attention Impairment: Verbal basic Safety/Judgment: Impaired Cognition Arousal/Alertness: Awake/alert Behavior During Therapy: WFL for tasks assessed/performed Overall Cognitive Status: Within Functional Limits for tasks assessed Area of Impairment: Safety/judgement,Problem solving,Attention,Awareness,Memory Orientation Level: Disoriented to,Person,Place,Time,Situation Current Attention Level: Divided Memory: Decreased recall of precautions,Decreased short-term memory Following Commands: Follows  one step commands consistently Safety/Judgement: Decreased awareness of safety,Decreased awareness of deficits Awareness: Emergent Problem Solving: Requires verbal cues General Comments: cognition is improving  Blood pressure 124/81, pulse 90, temperature 98.1 F (36.7 C), temperature source Oral, resp. rate 16, height 6' 0.01" (1.829 m), weight 70 kg, SpO2 98 %. Physical Exam Constitutional:      Appearance: Normal appearance.  HENT:     Head: Normocephalic and atraumatic.  Eyes:     Extraocular Movements: Extraocular movements intact.     Conjunctiva/sclera: Conjunctivae normal.     Pupils: Pupils are equal, round, and reactive to light.  Cardiovascular:     Rate and Rhythm: Normal rate and regular rhythm.     Heart sounds: No murmur heard.   Pulmonary:     Effort: Pulmonary effort is normal. No respiratory distress.     Breath sounds: Normal breath sounds. No wheezing.  Abdominal:     General: Abdomen is flat. Bowel sounds are normal. There is no distension.     Palpations: Abdomen is soft.  Musculoskeletal:     Comments: Right AKA with wound VAC.  There is pain with attempted hip flexion hip adduction and hip abduction. Evidence of several access grafts left arm.  Skin:    General: Skin is warm and dry.     Comments: Wound VAC in place to the AKA.  Appropriately tender  Neurological:     Mental Status: He is alert and oriented to person, place, and time.     Comments: Patient is alert in no acute distress.  Oriented x3.  Follows commands.  Psychiatric:        Mood and Affect: Mood normal.        Behavior: Behavior normal.  Results for orders placed or performed during the hospital encounter of 09/27/20 (from the past 24 hour(s))  Renal function panel     Status: Abnormal   Collection Time: 11/01/20  1:00 AM  Result Value Ref Range   Sodium 128 (L) 135 - 145 mmol/L   Potassium 4.1 3.5 - 5.1 mmol/L   Chloride 90 (L) 98 - 111 mmol/L   CO2 26 22 - 32 mmol/L    Glucose, Bld 120 (H) 70 - 99 mg/dL   BUN 39 (H) 6 - 20 mg/dL   Creatinine, Ser 7.31 (H) 0.61 - 1.24 mg/dL   Calcium 8.0 (L) 8.9 - 10.3 mg/dL   Phosphorus 5.5 (H) 2.5 - 4.6 mg/dL   Albumin 2.1 (L) 3.5 - 5.0 g/dL   GFR, Estimated 8 (L) >60 mL/min   Anion gap 12 5 - 15  CBC with Differential/Platelet     Status: Abnormal   Collection Time: 11/01/20  1:00 AM  Result Value Ref Range   WBC 17.4 (H) 4.0 - 10.5 K/uL   RBC 2.44 (L) 4.22 - 5.81 MIL/uL   Hemoglobin 7.0 (L) 13.0 - 17.0 g/dL   HCT 21.9 (L) 39.0 - 52.0 %   MCV 89.8 80.0 - 100.0 fL   MCH 28.7 26.0 - 34.0 pg   MCHC 32.0 30.0 - 36.0 g/dL   RDW 17.0 (H) 11.5 - 15.5 %   Platelets 377 150 - 400 K/uL   nRBC 0.0 0.0 - 0.2 %   Neutrophils Relative % 80 %   Neutro Abs 14.0 (H) 1.7 - 7.7 K/uL   Lymphocytes Relative 5 %   Lymphs Abs 0.9 0.7 - 4.0 K/uL   Monocytes Relative 12 %   Monocytes Absolute 2.0 (H) 0.1 - 1.0 K/uL   Eosinophils Relative 2 %   Eosinophils Absolute 0.3 0.0 - 0.5 K/uL   Basophils Relative 0 %   Basophils Absolute 0.0 0.0 - 0.1 K/uL   Immature Granulocytes 1 %   Abs Immature Granulocytes 0.12 (H) 0.00 - 0.07 K/uL   No results found.   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 Does the need for close, 24 hr/day medical supervision in concert with the patient's rehab needs make it unreasonable for this patient to be served in a less intensive setting? Potentially 2. Co-Morbidities requiring supervision/potential complications: Mitral valve vegetation status post replacement, end-stage renal disease on hemodialysis 3. Due to bladder management, bowel management, safety, skin/wound care, disease management, medication administration, pain management and patient education, does the patient require 24 hr/day rehab nursing? Potentially 4. Does the patient require coordinated care of a physician, rehab nurse, therapy disciplines of PT, OT to address physical and functional deficits  in the context of the above medical diagnosis(es)? Yes Addressing deficits in the following areas: balance, endurance, locomotion, strength, transferring, bowel/bladder control, bathing, dressing, toileting and psychosocial support 5. Can the patient actively participate in an intensive therapy program of at least 3 hrs of therapy per day at least 5 days per week? Yes 6. The potential for patient to make measurable gains while on inpatient rehab is Good for basic ADLs however patient will not be able to ascend and descend steps with walker after in hospital rehab stay 7. Anticipated functional outcomes upon discharge from inpatient rehab are min assist  with PT, supervision with OT, n/a with SLP. 8. Estimated rehab length of stay to reach the above functional goals is: N/A 9. Anticipated discharge destination: Will need first-floor apartment  10. Overall Rehab/Functional Prognosis: good  RECOMMENDATIONS: This patient's condition is appropriate for continued rehabilitative care in the following setting: SNF Patient has agreed to participate in recommended program. Yes Note that insurance prior authorization may be required for reimbursement for recommended care.  Comment: Recommend SNF as next step.  Lower intensity longer duration rehabilitation program recommended so living situation can be addressed.  Will need to have social work assist in order to change to [employer] apartment.   Lavon Paganini Angiulli, PA-C 11/01/2020   "I have personally performed a face to face diagnostic evaluation of this patient.  Additionally, I have reviewed and concur with the physician assistant's documentation above." Charlett Blake M.D. [facility] Fellow Am Acad of [employer] Med and Rehab Diplomate Am Board of Electrodiagnostic Med Fellow Am Board of Interventional Pain

## 2020-11-02 DIAGNOSIS — N186 End stage renal disease: Secondary | ICD-10-CM | POA: Diagnosis not present

## 2020-11-02 DIAGNOSIS — I132 Hypertensive heart and chronic kidney disease with heart failure and with stage 5 chronic kidney disease, or end stage renal disease: Secondary | ICD-10-CM | POA: Diagnosis not present

## 2020-11-02 DIAGNOSIS — A419 Sepsis, unspecified organism: Secondary | ICD-10-CM | POA: Diagnosis not present

## 2020-11-02 DIAGNOSIS — I639 Cerebral infarction, unspecified: Secondary | ICD-10-CM | POA: Diagnosis not present

## 2020-11-02 DIAGNOSIS — I330 Acute and subacute infective endocarditis: Secondary | ICD-10-CM | POA: Diagnosis not present

## 2020-11-02 DIAGNOSIS — D631 Anemia in chronic kidney disease: Secondary | ICD-10-CM | POA: Diagnosis not present

## 2020-11-02 DIAGNOSIS — R0602 Shortness of breath: Secondary | ICD-10-CM | POA: Diagnosis not present

## 2020-11-02 DIAGNOSIS — R531 Weakness: Secondary | ICD-10-CM | POA: Diagnosis not present

## 2020-11-02 DIAGNOSIS — I5032 Chronic diastolic (congestive) heart failure: Secondary | ICD-10-CM | POA: Diagnosis not present

## 2020-11-02 DIAGNOSIS — J189 Pneumonia, unspecified organism: Secondary | ICD-10-CM | POA: Diagnosis not present

## 2020-11-02 DIAGNOSIS — Z992 Dependence on renal dialysis: Secondary | ICD-10-CM | POA: Diagnosis not present

## 2020-11-02 DIAGNOSIS — N2581 Secondary hyperparathyroidism of renal origin: Secondary | ICD-10-CM | POA: Diagnosis not present

## 2020-11-02 LAB — CBC WITH DIFFERENTIAL/PLATELET
Abs Immature Granulocytes: 0.14 10*3/uL — ABNORMAL HIGH (ref 0.00–0.07)
Basophils Absolute: 0 10*3/uL (ref 0.0–0.1)
Basophils Relative: 0 %
Eosinophils Absolute: 0.4 10*3/uL (ref 0.0–0.5)
Eosinophils Relative: 2 %
HCT: 25 % — ABNORMAL LOW (ref 39.0–52.0)
Hemoglobin: 7.6 g/dL — ABNORMAL LOW (ref 13.0–17.0)
Immature Granulocytes: 1 %
Lymphocytes Relative: 7 %
Lymphs Abs: 1.1 10*3/uL (ref 0.7–4.0)
MCH: 27.1 pg (ref 26.0–34.0)
MCHC: 30.4 g/dL (ref 30.0–36.0)
MCV: 89.3 fL (ref 80.0–100.0)
Monocytes Absolute: 2.2 10*3/uL — ABNORMAL HIGH (ref 0.1–1.0)
Monocytes Relative: 13 %
Neutro Abs: 13.4 10*3/uL — ABNORMAL HIGH (ref 1.7–7.7)
Neutrophils Relative %: 77 %
Platelets: 361 10*3/uL (ref 150–400)
RBC: 2.8 MIL/uL — ABNORMAL LOW (ref 4.22–5.81)
RDW: 18.2 % — ABNORMAL HIGH (ref 11.5–15.5)
WBC: 17.3 10*3/uL — ABNORMAL HIGH (ref 4.0–10.5)
nRBC: 0 % (ref 0.0–0.2)

## 2020-11-02 LAB — RENAL FUNCTION PANEL
Albumin: 2.1 g/dL — ABNORMAL LOW (ref 3.5–5.0)
Anion gap: 11 (ref 5–15)
BUN: 17 mg/dL (ref 6–20)
CO2: 26 mmol/L (ref 22–32)
Calcium: 8.3 mg/dL — ABNORMAL LOW (ref 8.9–10.3)
Chloride: 95 mmol/L — ABNORMAL LOW (ref 98–111)
Creatinine, Ser: 4.63 mg/dL — ABNORMAL HIGH (ref 0.61–1.24)
GFR, Estimated: 15 mL/min — ABNORMAL LOW (ref >60)
Glucose, Bld: 108 mg/dL — ABNORMAL HIGH (ref 70–99)
Phosphorus: 3.3 mg/dL (ref 2.5–4.6)
Potassium: 3.8 mmol/L (ref 3.5–5.1)
Sodium: 132 mmol/L — ABNORMAL LOW (ref 135–145)

## 2020-11-02 LAB — TYPE AND SCREEN
ABO/RH(D): O POS
Antibody Screen: NEGATIVE
Unit division: 0

## 2020-11-02 LAB — BPAM RBC
Blood Product Expiration Date: 202204262359
ISSUE DATE / TIME: 202203281426
Unit Type and Rh: 5100

## 2020-11-02 MED ORDER — DARBEPOETIN ALFA 100 MCG/0.5ML IJ SOSY: 100.0000 ug | PREFILLED_SYRINGE | INTRAMUSCULAR | Status: DC

## 2020-11-02 MED ORDER — HYDROMORPHONE HCL 1 MG/ML IJ SOLN
0.5000 mg | INTRAMUSCULAR | Status: DC | PRN
Start: 2020-11-02 — End: 2020-11-04

## 2020-11-02 NOTE — Progress Notes (Signed)
Mobility Specialist - Progress Note   11/02/20 1246  Mobility  Activity Ambulated in hall  Level of Assistance Contact guard assist, steadying assist  Assistive Device Front wheel walker  Distance Ambulated (ft) 60 ft  Mobility Response Tolerated well  Mobility performed by Mobility specialist  $Mobility charge 1 Mobility   Pre-mobility: 93 HR During mobility: 104 HR Post-mobility: 89 HR  Pt able to stand from bed w/ elevated height w/o assist. Min guard during ambulation for safety. Pt to recliner after walk. He c/o 7/10 pain in his stump, he took pain med just prior to ambulation. Call bell in reach and pt expressed understanding to call when he wants to get in bed.  Pricilla Handler Mobility Specialist Mobility Specialist Phone: [phone]

## 2020-11-02 NOTE — Progress Notes (Signed)
PROGRESS NOTE    Patrick Anthony  [id number] DOB: 1970-10-05 DOA: 09/27/2020 PCP: Patrick Lei, MD   Chief Complaint  Patient presents with  . 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  Seen by ID managed with IV antibiotic, CT surgery consult salt was obtained subsequently underwent minimally invasive mitral valve replacement on 10/14/2020 and was managed under CT surgery service and subsequently transferred to [facility] on 3/16.  Patient was followed by ID, orthopedics.  He was found to have swelling on the right leg and seen by orthopedic underwent debridement on the right leg and treated for large complicated abscess with osteomyelitis involving his tibia with 7 by X.3 centimeter area of bone that was excised. Patient was seen by Dr. Sharol Anthony orthopedic ID and the right above-the-knee amputation has been recommended moving forward to manage this infection. Patient has been told by multiple providers multiple times the need for the above-the-knee amputation to avoid seeding of his new valve and his graft but has been reluctant and he is aware about dying from staph aureus bacteremia and sepsis. 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 Patient reports of ongoing pain at amputation site but controlled current p.o. and IV narcotics. We discussed about trying oral medication frequently then IV. He is asking if the wound VAC is making his pain worse.    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 Sharol Anthony  Cont vancomycin w/ HD- duration 10/14/20 > 11/25/2020 as per ID. Repeat blood cultures from 10/13/20 are negative. Patient is status post right AKA 10/29/20-although he had refused initially. We will try to wean him off IV narcotics continue oral oxycodone and oral Dilaudid, Tylenol, Neurontin Robaxin.  Plan is for skilled nursing facility hopefully in a day or 2. Has leukocytosis but downtrending. no fever.  Status post right AKA continue pain management and wound care as above, wound VAC in place.   ESRD and HD MWF: Status post HD 3/28.appreciate nephrology input on board.   Hyperkalemia:  Normalized Recent Labs  Lab 10/30/20 1939 10/31/20 0224 10/31/20 0741 11/01/20 0100 11/02/20 0100  K 4.1 4.0 4.2 4.1 3.8   Secondary hyperparathyroidism-calcium and phosphorus stable continue Sensipar binders and Hectorol per nephrology.  Monitor lab  Anemia of chronic kidney disease plus anemia of acute blood loss in the setting of AKA, Status post 3 units PRBC, also on aranesp and next dose has been increased per nephrology.  Monitor H&H and transfuse if less than 7 g.   Recent Labs  Lab 10/29/20 0138 10/30/20 0157 10/31/20 0224 11/01/20 0100 11/02/20 0100  HGB 8.5* 8.1* 7.7* 7.0* 7.6*  HCT 27.7* 26.3* 25.3* 21.9* 25.0*   History of hepatitis C/liver cirrhosis-outpatient follow-up with ID.Monitor LFTs intermittently.  Monitor labs. Recent Labs  Lab 10/29/20 0138 10/30/20 0157 10/31/20 0224 11/01/20 0100 11/02/20 0100  ALBUMIN 2.1* 2.4* 2.3* 2.1* 2.1*   Hyponatremia l overall is stable.  Continue HD,monitor labs.  Hypertension  stable on metoprolol and clonidine patch.  Nasal bleeding on 3/20 resolved.  Diet Order            Diet renal with fluid restriction Fluid restriction: 1200 mL Fluid; Room service appropriate? Yes; Fluid consistency: Thin  Diet effective now                 Nutrition Problem: Increased nutrient needs Etiology: chronic illness (ESRD on HD) Signs/Symptoms: estimated needs Interventions: Nepro shake,Refer to RD note for recommendations Patient's Body mass index is 22.75 kg/m.  DVT prophylaxis: SCDs Start: 10/29/20 1639 SCDs Start: 10/20/20 1721 enoxaparin (LOVENOX) injection 30 mg Start: 10/19/20 0800 SCDs Start: 10/15/20 0610 Code Status:   Code Status: Full Code  Family Communication:plan of care discussed with patient at bedside. I had discussed with his daughter Patrick Anthony extensively 3/23  Status [id number] Remains inpatient appropriate because:IV treatments appropriate due to intensity of illness or inability to take PO and Inpatient level of care appropriate due to severity of illness  Dispo:The patient is from:Home            Anticipated d/c is to:CIR nota candidate per CIR and plan is for SNF-hopefully in next day or so once able to control pain with oral medication            Patient currently is not medically stable to d/c.            Difficult to place patient No  Unresulted Labs (From admission, onward)          Start     Ordered   10/21/20 0500  Renal function panel  Daily,   R     Question:  Specimen collection method  Answer:  Lab=Lab collect   10/20/20 2128   10/21/20 0500  CBC with Differential/Platelet  Daily,   R     Question:  Specimen collection method  Answer:  Lab=Lab collect   10/20/20 2128   Signed and Held  Renal function panel  Once,   R       Question:  Specimen collection method  Answer:  Lab=Lab collect   Signed and Held         Medications reviewed:  Scheduled Meds: . (feeding supplement) PROSource Plus  30 mL Oral BID BM  . aspirin EC   325 mg Oral Daily  . calcium acetate  667 mg Oral TID WC  . Chlorhexidine Gluconate Cloth  6 each Topical Q0600  . cinacalcet  60 mg Oral Q breakfast  . cloNIDine  0.3 mg Transdermal Weekly  . [START ON 11/08/2020] darbepoetin (ARANESP) injection - DIALYSIS  100 mcg Intravenous Q Mon-HD  . docusate sodium  100 mg Oral BID  . enoxaparin (LOVENOX) injection  30 mg Subcutaneous Q48H  . feeding supplement (NEPRO CARB STEADY)  237 mL Oral TID BM  . gabapentin  300 mg Oral QHS  . influenza vac split quadrivalent PF  0.5 mL Intramuscular Tomorrow-1000  . metoprolol tartrate  50 mg Oral BID  . multivitamin  1 tablet Oral QHS  . oxymetazoline  1 spray Each Nare BID  . pantoprazole  40 mg Oral Daily  . senna-docusate  1 tablet Oral BID  . sodium chloride flush  3 mL Intravenous Q12H   Continuous Infusions: .  sodium chloride    . sodium chloride 10 mL/hr at 10/29/20 1351  . sodium chloride    . methocarbamol (ROBAXIN) IV    . vancomycin Stopped (11/01/20 1704)   Consultants:see note  Procedures:see note  Antimicrobials: Anti-infectives (From admission, onward)   Start     Dose/Rate Route Frequency Ordered Stop   10/30/20 1100  vancomycin (VANCOCIN) IVPB 750 mg/150 ml premix        750 mg 150 mL/hr over 60 Minutes Intravenous Once in dialysis 10/30/20 0833 10/30/20 1317   10/29/20 1000  ceFAZolin (ANCEF) IVPB 2g/100 mL premix  Status:  Discontinued        2 g 200 mL/hr over 30 Minutes Intravenous To Short Stay 10/29/20 0730 10/29/20 1634   10/27/20 2015  vancomycin (VANCOCIN) IVPB 750 mg/150 ml premix        750 mg 150 mL/hr over 60 Minutes Intravenous Every M-W-F (Hemodialysis) 10/27/20 2005     10/25/20 0922  vancomycin (VANCOCIN) 500-5 MG/100ML-% IVPB       Note to Pharmacy: Kalman Shan   : cabinet override      10/25/20 0922 10/25/20 0958   10/21/20 0600  ceFAZolin (ANCEF) IVPB 2g/100 mL premix        2 g 200 mL/hr over 30 Minutes Intravenous On call to O.R. 10/20/20 1349 10/21/20  0650   10/20/20 2243  vancomycin (VANCOCIN) 500-5 MG/100ML-% IVPB       Note to Pharmacy: Wallace Cullens   : cabinet override      10/20/20 2243 10/20/20 2243   10/20/20 1437  ceFAZolin (ANCEF) 2-4 GM/100ML-% IVPB       Note to Pharmacy: Ladoris Gene   : cabinet override      10/20/20 1437 10/20/20 1612   10/20/20 1200  vancomycin (VANCOREADY) IVPB 500 mg/100 mL  Status:  Discontinued        500 mg 100 mL/hr over 60 Minutes Intravenous Every M-W-F (Hemodialysis) 10/20/20 0903 10/27/20 2005   10/18/20 1124  vancomycin variable dose per unstable renal function (pharmacist dosing)  Status:  Discontinued         Does not apply See admin instructions 10/18/20 1124 10/20/20 0903   10/16/20 1600  vancomycin (VANCOREADY) IVPB 500 mg/100 mL  Status:  Discontinued        500 mg 100 mL/hr over 60 Minutes Intravenous Every Sat (Hemodialysis) 10/16/20 1306 10/16/20 1312   10/15/20 1500  vancomycin (VANCOCIN) IVPB 500 mg/100 ml premix  Status:  Discontinued        500 mg 100 mL/hr over 60 Minutes Intravenous Every M-W-F (Hemodialysis) 10/15/20 1357 10/17/20 0942   10/14/20 2130  vancomycin (VANCOCIN) IVPB 1000 mg/200 mL premix  Status:  Discontinued        1,000 mg 200 mL/hr over 60 Minutes Intravenous  Once 10/14/20 1403 10/14/20 1547   10/14/20 2130  vancomycin (VANCOREADY) IVPB 500 mg/100 mL        500 mg 100 mL/hr over 60 Minutes Intravenous  Once 10/14/20 1547 10/14/20 2222   10/14/20 1730  cefUROXime (ZINACEF) 1.5 g in sodium chloride 0.9 % 100 mL IVPB        1.5 g 200 mL/hr over 30 Minutes Intravenous Every 24 hr x 2 10/14/20 1403 10/15/20 1718   10/14/20 1128  vancomycin (VANCOCIN) 1,000 mg in sodium chloride 0.9 % 1,000 mL irrigation  Status:  Discontinued          As needed 10/14/20 1128 10/14/20 1401   10/14/20 0400  vancomycin (VANCOREADY) IVPB 1250 mg/250 mL        1,250 mg 166.7 mL/hr over 90 Minutes Intravenous To Surgery 10/13/20 1039 10/14/20 0910   10/14/20 0400  cefUROXime  (ZINACEF) 1.5 g in sodium chloride 0.9 % 100 mL IVPB        1.5 g 200 mL/hr over 30 Minutes Intravenous To Surgery 10/13/20 1039 10/14/20 0835   10/14/20 0400  cefUROXime (ZINACEF) 750 mg in sodium chloride 0.9 % 100 mL IVPB        750 mg 200 mL/hr over 30 Minutes Intravenous To Surgery 10/13/20 1039 10/14/20 1250   10/14/20 0400  vancomycin (VANCOCIN) 1,000 mg in sodium chloride 0.9 % 1,000 mL irrigation  Status:  Discontinued         Irrigation To Surgery 10/13/20 1039 10/14/20 1403   10/13/20 1657  vancomycin (VANCOCIN) 500-5 MG/100ML-% IVPB       Note to Pharmacy: Judieth Keens  : cabinet override      10/13/20 1657 10/13/20 1717   10/12/20 0600  ceFAZolin (ANCEF) IVPB 2g/100 mL premix        2 g 200 mL/hr over 30 Minutes Intravenous On call to O.R. 10/11/20 1841 10/12/20 1315   10/11/20 1415  vancomycin (VANCOCIN) IVPB 500 mg/100 ml premix  Status:  Discontinued        500 mg 100 mL/hr over 60 Minutes Intravenous Every M-W-F (Hemodialysis) 10/11/20 1413 10/14/20 1403   10/11/20 1413  vancomycin (VANCOCIN) 500-5 MG/100ML-% IVPB       Note to Pharmacy: Kalman Shan   : cabinet override      10/11/20 1413 10/12/20 0229   10/11/20 1353  Vancomycin (VANCOCIN) 750-5 MG/150ML-% IVPB  Status:  Discontinued       Note to Pharmacy: Kalman Shan   : cabinet override      10/11/20 1353 10/11/20 1417   10/08/20 1200  vancomycin (VANCOCIN) IVPB 750 mg/150 ml premix  Status:  Discontinued        750 mg 150 mL/hr over 60 Minutes Intravenous Every M-W-F (Hemodialysis) 10/05/20 1404 10/11/20 1413   10/06/20 1619  vancomycin (VANCOCIN) 500-5 MG/100ML-% IVPB       Note to Pharmacy: Judieth Keens  : cabinet override      10/06/20 1619 10/06/20 1736   10/06/20 1200  vancomycin (VANCOCIN) IVPB 750 mg/150 ml premix  Status:  Discontinued        750 mg 150 mL/hr over 60 Minutes Intravenous Every M-W-F (Hemodialysis) 10/04/20 0905 10/05/20 1404   10/06/20 1200  vancomycin (VANCOCIN) IVPB 500  mg/100 ml premix        500 mg 100 mL/hr over 60 Minutes Intravenous Every Wed (Hemodialysis) 10/05/20 1404 10/06/20 2000   10/01/20 2000  DAPTOmycin (CUBICIN) 840 mg in sodium chloride 0.9 % IVPB  Status:  Discontinued        840 mg 233.6 mL/hr over 30 Minutes Intravenous Every 48 hours 10/01/20 0922 10/01/20 1056   10/01/20 1200  vancomycin (VANCOCIN) IVPB 1000 mg/200 mL premix  Status:  Discontinued        1,000 mg 200 mL/hr over 60 Minutes Intravenous Every M-W-F (Hemodialysis) 10/01/20 1056 10/04/20 0845   09/29/20 1200  vancomycin (VANCOCIN) IVPB 1000 mg/200 mL premix  Status:  Discontinued        1,000 mg 200 mL/hr over 60 Minutes Intravenous Every M-W-F (Hemodialysis) 09/27/20 1443 10/01/20 0922   09/29/20 1200  ceFEPIme (MAXIPIME) 2 g in sodium chloride 0.9 % 100 mL IVPB  Status:  Discontinued        2 g 200 mL/hr over 30 Minutes Intravenous Every M-W-F (Hemodialysis) 09/27/20 1443 09/28/20 1054   09/28/20 0900  metroNIDAZOLE (FLAGYL) IVPB 500 mg  Status:  Discontinued        500 mg 100 mL/hr over 60 Minutes Intravenous Every 12 hours 09/27/20 1808 09/28/20 1054   09/28/20 0900  ceFEPIme (MAXIPIME) 1 g in sodium chloride 0.9 % 100 mL IVPB  Status:  Discontinued        1 g 200 mL/hr over 30 Minutes Intravenous Every 12 hours 09/27/20 1808 09/27/20 1812   09/27/20 1445  vancomycin (VANCOREADY) IVPB 2000 mg/400 mL        2,000 mg 200 mL/hr over 120 Minutes Intravenous  Once 09/27/20 1433 09/27/20 2330   09/27/20 1430  ceFEPIme (MAXIPIME) 2 g in sodium chloride 0.9 % 100 mL IVPB        2 g 200 mL/hr over 30 Minutes Intravenous  Once 09/27/20 1418 09/27/20 1541   09/27/20 1430  metroNIDAZOLE (FLAGYL) IVPB 500 mg        500 mg 100 mL/hr over 60 Minutes Intravenous  Once 09/27/20 1418 09/27/20 2214   09/27/20 1430  vancomycin (VANCOREADY) IVPB 1750 mg/350 mL  Status:  Discontinued        1,750 mg 175 mL/hr over 120 Minutes Intravenous  Once 09/27/20 1418 09/27/20 1433      Culture/Microbiology    Component Value Date/Time   SDES TISSUE 10/20/2020 1612   SPECREQUEST RIGHT LEG ABSC SPEC A 10/20/2020 1612   CULT  10/20/2020 1612    FEW METHICILLIN RESISTANT STAPHYLOCOCCUS AUREUS CRITICAL RESULT CALLED TO, READ BACK BY AND VERIFIED WITH: RN N.CROSFON AT 0954 ON 10/22/2020 BY T.SAAD NO ANAEROBES ISOLATED Performed at [facility], [place] [address], [place], [place] [address]    REPTSTATUS 10/26/2020 FINAL 10/20/2020 1612    Other culture-see note  Objective: Vitals: Today's Vitals   11/02/20 0453 11/02/20 0645 11/02/20 0722 11/02/20 1209  BP:   111/69 (!) 122/95  Pulse:   82   Resp:   15 14  Temp:   97.9 F (36.6 C) 98.3 F (36.8 C)  TempSrc:   Oral Oral  SpO2:   98% 98%  Weight:      Height:      PainSc: Asleep 7       Intake/Output Summary (Last 24 hours) at 11/02/2020 1300 Last data filed at 11/01/2020 2102 Gross per 24 hour  Intake 890 ml  Output 1500 ml  Net -610 ml   Filed Weights   11/01/20 1345 11/01/20 1719 11/02/20 0408  Weight: 73.2 kg 71.9 kg 76.1 kg   Weight change: 3.21 kg  Intake/Output from previous day: 03/28 0701 - 03/29 0700 In: 890 [P.O.:240; Blood:500; IV Piggyback:150] Out: 1500  Intake/Output this shift: No intake/output data recorded. Filed Weights   11/01/20 1345 11/01/20 1719 11/02/20 0408  Weight: 73.2 kg 71.9 kg 76.1 kg   Examination: General exam: AAOx3 , NAD, weak appearing. HEENT:Oral mucosa moist, Ear/Nose WNL grossly, dentition normal. Respiratory system: bilaterally clear,no wheezing or crackles,no use of accessory muscle Cardiovascular system: S1 & S2 +, No JVD,. Gastrointestinal system: Abdomen soft, NT,ND, BS+ Nervous System:Alert, awake, moving extremities and grossly nonfocal Extremities: Right AKA stump in place with wound VAC,  no edema, distal peripheral pulses palpable.  Skin: No rashes,no icterus. MSK: Normal muscle bulk,tone, power  Data Reviewed: I have personally reviewed  following labs and imaging  studies CBC: Recent Labs  Lab 10/29/20 0138 10/30/20 0157 10/31/20 0224 11/01/20 0100 11/02/20 0100  WBC 10.0 21.8* 15.9* 17.4* 17.3*  NEUTROABS 6.8 19.2* 11.2* 14.0* 13.4*  HGB 8.5* 8.1* 7.7* 7.0* 7.6*  HCT 27.7* 26.3* 25.3* 21.9* 25.0*  MCV 91.1 90.4 90.7 89.8 89.3  PLT 389 440* 360 377 [id number]   Basic Metabolic Panel: Recent Labs  Lab 10/29/20 0138 10/30/20 0157 10/30/20 0739 10/30/20 1939 10/31/20 0224 10/31/20 0741 11/01/20 0100 11/02/20 0100  NA 130* 128*  --   --  131*  --  128* 132*  K 4.1 6.4*   < > 4.1 4.0 4.2 4.1 3.8  CL 93* 93*  --   --  91*  --  90* 95*  CO2 25 22  --   --  28  --  26 26  GLUCOSE 111* 98  --   --  97  --  120* 108*  BUN 38* 51*  --   --  29*  --  39* 17  CREATININE 7.11* 8.77*  --   --  5.37*  --  7.31* 4.63*  CALCIUM 8.6* 8.4*  --   --  8.5*  --  8.0* 8.3*  PHOS 4.7* 5.3*  --   --  4.8*  --  5.5* 3.3   < > = values in this interval not displayed.   GFR: Estimated Creatinine Clearance: 20.8 mL/min (A) (by C-G formula based on SCr of 4.63 mg/dL (H)). Liver Function Tests: Recent Labs  Lab 10/29/20 0138 10/30/20 0157 10/31/20 0224 11/01/20 0100 11/02/20 0100  ALBUMIN 2.1* 2.4* 2.3* 2.1* 2.1*   No results for input(s): LIPASE, AMYLASE in the last 168 hours. No results for input(s): AMMONIA in the last 168 hours. Coagulation Profile: No results for input(s): INR, PROTIME in the last 168 hours. Cardiac Enzymes: No results for input(s): CKTOTAL, CKMB, CKMBINDEX, TROPONINI in the last 168 hours. BNP (last 3 results) No results for input(s): PROBNP in the last 8760 hours. HbA1C: No results for input(s): HGBA1C in the last 72 hours. CBG: No results for input(s): GLUCAP in the last 168 hours. Lipid Profile: No results for input(s): CHOL, HDL, LDLCALC, TRIG, CHOLHDL, LDLDIRECT in the last 72 hours. Thyroid Function Tests: No results for input(s): TSH, T4TOTAL, FREET4, T3FREE, THYROIDAB in the last 72  hours. Anemia Panel: No results for input(s): VITAMINB12, FOLATE, FERRITIN, TIBC, IRON, RETICCTPCT in the last 72 hours. Sepsis Labs: No results for input(s): PROCALCITON, LATICACIDVEN in the last 168 hours.  No results found for this or any previous visit (from the past 240 hour(s)). Radiology Studies: No results found.  LOS: 28 days   Antonieta Pert, MD [facility]  11/02/2020, 1:00 PM

## 2020-11-02 NOTE — Progress Notes (Signed)
   10/28/20 1058  Clinical Encounter Type  Visited With Patient  Visit Type Initial  Referral From Nurse  Consult/Referral To Chaplain  Stress Factors  Patient Stress Factors Major life changes;Exhausted  Chaplain responded to consult for Patrick Anthony.  Mr. Prochnow stated all was not well and he has some major decisions to make concerning his leg amputation and other health issues.  We talked about his concerns, his faith beliefs and acknowledged his feelings and where he is emotionally.  Major life changing decisions.  Mr. Petrilli was very transparent about where he is and his thoughts.  Offred support and a listening heart.    Chaplain Kylil Swopes [place]  [phone]

## 2020-11-02 NOTE — Progress Notes (Signed)
Physical Therapy Treatment Patient Details Name: Patrick Anthony MRN: [id number] DOB: [birth year] Today's Date: 11/02/2020    History of Present Illness Pt is a 50 y.o. male admitted 09/27/20 with fever, chest pain, SOB, generalized weakness. Found to have MRSA bacteremia. 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 ECHO with mitral valve vegetation. No significant CAD on LHC. S/p minimally invasive MVR with closure of PFO on 3/10. 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 PMH includes CKD (HD MWF), HTN, CHF, Hep C, polysubstance abuse.   PT Comments    Pt progressing with mobility. Today's session focused on transfer and gait training with RW, as well as post-op amputation education, including precautions, positioning, edema control, phantom limb pain. Pt remains limited by pain, weakness, decreased activity tolerance, and impaired balance strategies; at high risk for falls. Noted CIR declined; therefore, recommend SNF-level therapies to maximize functional mobility and independence prior to return home. Will continue to follow acutely to address established goals.   Follow Up Recommendations  SNF;Supervision for mobility/OOB (CIR declined)     Equipment Recommendations  Rolling walker with 5" wheels;3in1 (PT);Wheelchair (measurements PT);Wheelchair cushion (measurements PT)    Recommendations for Other Services       Precautions / Restrictions Precautions Precautions: Fall;Other (comment) Precaution Comments: Wound vac to R AKA    Mobility  Bed Mobility Overal bed mobility: Modified Independent Bed Mobility: Supine to Sit                Transfers Overall transfer level: Needs assistance Equipment used: Rolling walker (2 wheeled) Transfers: Sit to/from Stand Sit to Stand: Min guard            Ambulation/Gait Ambulation/Gait assistance: Min guard Gait  Distance (Feet): 70 Feet Assistive device: Rolling walker (2 wheeled)   Gait velocity: Decreased   General Gait Details: Slow, antalgic gait with RW and min guard for balance; pt with good ability to perform hop-to gait pattern on LLE; 2x standing rest break leaning over RW with c/o RLE pain; noted BUE tremulous, pt reports from fatigue and pain   Stairs Stairs:  (pt declined stair training; initiated educ on scooting technique; pt reports, "Let's get to that if we have to")           Wheelchair Mobility    Modified Rankin (Stroke Patients Only)       Balance Overall balance assessment: Needs assistance Sitting-balance support: No upper extremity supported;Feet supported Sitting balance-Leahy Scale: Good     Standing balance support: Single extremity supported;During functional activity;Bilateral upper extremity supported Standing balance-Leahy Scale: Poor Standing balance comment: reliant on UE support of RW                            Cognition Arousal/Alertness: Awake/alert Behavior During Therapy: WFL for tasks assessed/performed Overall Cognitive Status: Within Functional Limits for tasks assessed                                 General Comments: Not formally tested      Exercises      General Comments General comments (skin integrity, edema, etc.): Reviewed educ re: phantom limb pain, precautions/positioning, hip flexor stretch, future prosthetic wear. Discussed no SNF acceptance yet, may need to consider possibility of return home and importance of stair training - pt reports only  wanting to have to try stairs again if absolutely necessary (aka for sure going home)      Pertinent Vitals/Pain Pain Assessment: 0-10 Pain Score: 8  Pain Location: R residual limb ("it's because of the wound vac") Pain Descriptors / Indicators: Sore;Squeezing Pain Intervention(s): Patient requesting pain meds-RN notified;Monitored during session     Home Living                      Prior Function            PT Goals (current goals can now be found in the care plan section) Progress towards PT goals: Progressing toward goals    Frequency    Min 3X/week      PT Plan Current plan remains appropriate    Co-evaluation              AM-PAC PT "6 Clicks" Mobility   Outcome Measure  Help needed turning from your back to your side while in a flat bed without using bedrails?: None Help needed moving from lying on your back to sitting on the side of a flat bed without using bedrails?: None Help needed moving to and from a bed to a chair (including a wheelchair)?: A Little Help needed standing up from a chair using your arms (e.g., wheelchair or bedside chair)?: A Little Help needed to walk in hospital room?: A Little Help needed climbing 3-5 steps with a railing? : A Lot 6 Click Score: 19    End of Session   Activity Tolerance: Patient tolerated treatment well;Patient limited by pain Patient left: in bed;with call bell/phone within reach;with nursing/sitter in room Nurse Communication: Mobility status PT Visit Diagnosis: Difficulty in walking, not elsewhere classified (R26.2);Other abnormalities of gait and mobility (R26.89)     Time: [id number] PT Time Calculation (min) (ACUTE ONLY): 26 min  Charges:  $Gait Training: 8-22 mins $Therapeutic Activity: 8-22 mins                    Mabeline Caras, PT, DPT Acute Rehabilitation Services  Pager [phone] Office [place] 11/02/2020, 5:13 PM

## 2020-11-02 NOTE — Progress Notes (Signed)
Patient is postop day 3 status post above-knee amputation.  He is sitting in his bed states he is "not been out of bed yet because it "hurts too much.  Patient has a wound VAC which is functioning well he has 150 cc of bloody drainage Plan for discharge to skilled nursing when bed available

## 2020-11-02 NOTE — TOC Progression Note (Signed)
Transition of Care [facility]) - Progression Note    Patient Details  Name: Patrick Anthony MRN: [id number] Date of Birth: 15-Oct-1970  Transition of Care [facility]) CM/SW [place], [place] Phone Number: 11/02/2020, 2:18 PM  Clinical Narrative:     CSW spoke with [facility]- requested they review clinicals for short term rehab. SNF will contact CSW once decision has been made.   Thurmond Butts, MSW, LCSW Clinical Social Worker   Expected Discharge Plan: Skilled Nursing Facility Barriers to Discharge: Continued Medical Work up,SNF Pending bed offer  Expected Discharge Plan and Services Expected Discharge Plan: [place] In-house Referral: Clinical Social Work Discharge Planning Services: CM Consult Post Acute Care Choice: [place] Living arrangements for the past 2 months: Apartment                                       Social Determinants of Health (SDOH) Interventions    Readmission Risk Interventions No flowsheet data found.

## 2020-11-02 NOTE — Progress Notes (Signed)
[facility] Progress Note  Subjective: Patient states pain is improved today.  Feels better after transfusion yesterday.  No issues with dialysis  Vitals:   11/01/20 2006 11/01/20 2340 11/02/20 0408 11/02/20 0722  BP: 112/80 104/84 124/86 111/69  Pulse:  93  82  Resp:  18  15  Temp: 99.3 F (37.4 C) 99 F (37.2 C) 98.7 F (37.1 C) 97.9 F (36.6 C)  TempSrc: Oral Oral Oral Oral  SpO2: 96% 95% 99% 98%  Weight:   76.1 kg   Height:        Exam: GEN: wdwn, sitting in bed, nad ENT: no nasal discharge, mmm EYES: no scleral icterus, eomi CV: normal rate PULM: no iwob, bilateral chest rise ABD: NABS, non-distended SKIN: no rashes or jaundice EXT: no edema, R aka wrapped LUE AVG: +bruit    OP HD: MWF [facility]    4h 350/500 new edw here 69- 71kg (was82.5kg)2/2.5 bath LUA AVGHep 5000 -Aranesp 62mgIVq 2wks - last 09/27/20 (Verified order) -Hectorol332m IV qHD    Assessment/ Plan: 1. Anterior shin osteomyelitis: status post I&D per orthopedics. Pathology showed staph aureus in the tibia. 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 Continue w/ pain control 2. SP min invasive MVR(bioprosthetic)replacement - on 3/10. Doing well from cardiac standpoint 3. 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 Underwent tooth extraction3/8/22.PerID to get 6 wks IV vanc w/ end date of 11/25/20. 4. ESRD - usual HDMWF.Continue per schedule 5. HTN/volume - continues on clonidine patch and metoprolol 25 bid. New lower dry wt here about 69- 71kg. BP's well controlled.  6. Anemiaof CKD- SP 2 units of PRBCs 3/10.1 unit on 3/28.  Received ESA yesterday.  Will increase dose with next administration. 7. Secondary Hyperparathyroidism -Calcium and phosphorus controlled. Continue sensipar, binders, and hectorol 8. Hx Hep C /liver cirrhosis.       [name]3/29/2022, 10:26 AM   Recent Labs  Lab 11/01/20 0100  11/02/20 0100  K 4.1 3.8  BUN 39* 17  CREATININE 7.31* 4.63*  CALCIUM 8.0* 8.3*  PHOS 5.5* 3.3  HGB 7.0* 7.6*   Inpatient medications: . (feeding supplement) PROSource Plus  30 mL Oral BID BM  . aspirin EC  325 mg Oral Daily  . calcium acetate  667 mg Oral TID WC  . Chlorhexidine Gluconate Cloth  6 each Topical Q0600  . cinacalcet  60 mg Oral Q breakfast  . cloNIDine  0.3 mg Transdermal Weekly  . darbepoetin (ARANESP) injection - DIALYSIS  60 mcg Intravenous Q Mon-HD  . docusate sodium  100 mg Oral BID  . enoxaparin (LOVENOX) injection  30 mg Subcutaneous Q48H  . feeding supplement (NEPRO CARB STEADY)  237 mL Oral TID BM  . gabapentin  300 mg Oral QHS  . influenza vac split quadrivalent PF  0.5 mL Intramuscular Tomorrow-1000  . metoprolol tartrate  50 mg Oral BID  . multivitamin  1 tablet Oral QHS  . oxymetazoline  1 spray Each Nare BID  . pantoprazole  40 mg Oral Daily  . senna-docusate  1 tablet Oral BID  . sodium chloride flush  3 mL Intravenous Q12H   . sodium chloride    . sodium chloride 10 mL/hr at 10/29/20 1351  . sodium chloride    . methocarbamol (ROBAXIN) IV    . vancomycin Stopped (11/01/20 1704)   sodium chloride, acetaminophen, bisacodyl, camphor-menthol, diphenhydrAMINE, HYDROmorphone (DILAUDID) injection, HYDROmorphone, magnesium citrate, methocarbamol **OR** methocarbamol (ROBAXIN) IV, metoCLOPramide **OR**  metoCLOPramide (REGLAN) injection, ondansetron **OR** ondansetron (ZOFRAN) IV, oxyCODONE, polyethylene glycol, sodium chloride flush

## 2020-11-03 DIAGNOSIS — R531 Weakness: Secondary | ICD-10-CM | POA: Diagnosis not present

## 2020-11-03 DIAGNOSIS — R0602 Shortness of breath: Secondary | ICD-10-CM | POA: Diagnosis not present

## 2020-11-03 DIAGNOSIS — Z992 Dependence on renal dialysis: Secondary | ICD-10-CM | POA: Diagnosis not present

## 2020-11-03 DIAGNOSIS — A419 Sepsis, unspecified organism: Secondary | ICD-10-CM | POA: Diagnosis not present

## 2020-11-03 DIAGNOSIS — I639 Cerebral infarction, unspecified: Secondary | ICD-10-CM | POA: Diagnosis not present

## 2020-11-03 DIAGNOSIS — I5032 Chronic diastolic (congestive) heart failure: Secondary | ICD-10-CM | POA: Diagnosis not present

## 2020-11-03 DIAGNOSIS — I132 Hypertensive heart and chronic kidney disease with heart failure and with stage 5 chronic kidney disease, or end stage renal disease: Secondary | ICD-10-CM | POA: Diagnosis not present

## 2020-11-03 DIAGNOSIS — N186 End stage renal disease: Secondary | ICD-10-CM | POA: Diagnosis not present

## 2020-11-03 DIAGNOSIS — I330 Acute and subacute infective endocarditis: Secondary | ICD-10-CM | POA: Diagnosis not present

## 2020-11-03 DIAGNOSIS — J189 Pneumonia, unspecified organism: Secondary | ICD-10-CM | POA: Diagnosis not present

## 2020-11-03 DIAGNOSIS — D631 Anemia in chronic kidney disease: Secondary | ICD-10-CM | POA: Diagnosis not present

## 2020-11-03 DIAGNOSIS — N2581 Secondary hyperparathyroidism of renal origin: Secondary | ICD-10-CM | POA: Diagnosis not present

## 2020-11-03 LAB — CBC WITH DIFFERENTIAL/PLATELET
Abs Immature Granulocytes: 0.18 10*3/uL — ABNORMAL HIGH (ref 0.00–0.07)
Basophils Absolute: 0.1 10*3/uL (ref 0.0–0.1)
Basophils Relative: 0 %
Eosinophils Absolute: 0.6 10*3/uL — ABNORMAL HIGH (ref 0.0–0.5)
Eosinophils Relative: 3 %
HCT: 25.5 % — ABNORMAL LOW (ref 39.0–52.0)
Hemoglobin: 7.6 g/dL — ABNORMAL LOW (ref 13.0–17.0)
Immature Granulocytes: 1 %
Lymphocytes Relative: 7 %
Lymphs Abs: 1.3 10*3/uL (ref 0.7–4.0)
MCH: 26.6 pg (ref 26.0–34.0)
MCHC: 29.8 g/dL — ABNORMAL LOW (ref 30.0–36.0)
MCV: 89.2 fL (ref 80.0–100.0)
Monocytes Absolute: 2.3 10*3/uL — ABNORMAL HIGH (ref 0.1–1.0)
Monocytes Relative: 13 %
Neutro Abs: 13.7 10*3/uL — ABNORMAL HIGH (ref 1.7–7.7)
Neutrophils Relative %: 76 %
Platelets: 410 10*3/uL — ABNORMAL HIGH (ref 150–400)
RBC: 2.86 MIL/uL — ABNORMAL LOW (ref 4.22–5.81)
RDW: 18.2 % — ABNORMAL HIGH (ref 11.5–15.5)
WBC: 18.1 10*3/uL — ABNORMAL HIGH (ref 4.0–10.5)
nRBC: 0 % (ref 0.0–0.2)

## 2020-11-03 LAB — RENAL FUNCTION PANEL
Albumin: 2.2 g/dL — ABNORMAL LOW (ref 3.5–5.0)
Anion gap: 14 (ref 5–15)
BUN: 32 mg/dL — ABNORMAL HIGH (ref 6–20)
CO2: 24 mmol/L (ref 22–32)
Calcium: 8.5 mg/dL — ABNORMAL LOW (ref 8.9–10.3)
Chloride: 90 mmol/L — ABNORMAL LOW (ref 98–111)
Creatinine, Ser: 6.79 mg/dL — ABNORMAL HIGH (ref 0.61–1.24)
GFR, Estimated: 9 mL/min — ABNORMAL LOW (ref >60)
Glucose, Bld: 94 mg/dL (ref 70–99)
Phosphorus: 4.2 mg/dL (ref 2.5–4.6)
Potassium: 3.4 mmol/L — ABNORMAL LOW (ref 3.5–5.1)
Sodium: 128 mmol/L — ABNORMAL LOW (ref 135–145)

## 2020-11-03 LAB — GLUCOSE, CAPILLARY: Glucose-Capillary: 83 mg/dL (ref 70–99)

## 2020-11-03 LAB — VANCOMYCIN, RANDOM: Vancomycin Rm: 18

## 2020-11-03 MED ORDER — VANCOMYCIN HCL IN DEXTROSE 750-5 MG/150ML-% IV SOLN
INTRAVENOUS | Status: AC
  Administered 2020-11-03: 750 mg via INTRAVENOUS
  Filled 2020-11-03: qty 150

## 2020-11-03 MED ORDER — HYDROMORPHONE HCL 2 MG PO TABS
ORAL_TABLET | ORAL | Status: AC
  Administered 2020-11-03: 2 mg via ORAL
  Filled 2020-11-03: qty 1

## 2020-11-03 MED ORDER — ACETAMINOPHEN 325 MG PO TABS
ORAL_TABLET | ORAL | Status: AC
  Administered 2020-11-03: 650 mg via ORAL
  Filled 2020-11-03: qty 2

## 2020-11-03 NOTE — TOC Benefit Eligibility Note (Signed)
Transition of Care [facility]) Benefit Eligibility Note    Patient Details  Name: Patrick Anthony MRN: [id number] Date of Birth: April 24, 1971   Medication/Dose: Lyrica 75 mg not approved Plan preferes Pregabalin '75mg'$  once a day  Covered?: Yes  Tier:  (N/A)  Prescription Coverage Preferred Pharmacy: Any retail pharmacy  Spoke with Person/Company/Phone Number:: [employer]/ [employer] / [phone]  Co-Pay: 3.00 for 30 or 90 day supply  Prior Approval: No  Deductible:  (N/A)       Orbie Pyo Phone Number: 11/03/2020, 3:45 PM

## 2020-11-03 NOTE — Progress Notes (Signed)
Mobility Specialist: Progress Note   11/03/20 1705  Mobility  Activity Ambulated in room  Level of Assistance Contact guard assist, steadying assist  Assistive Device Front wheel walker  Distance Ambulated (ft) 56 ft  Mobility Response Tolerated well  Mobility performed by Mobility specialist  $Mobility charge 1 Mobility   Pt c/o 5/10 pain pre-mobility. Pt otherwise asx. Pt needed to stand for roughly 15-20 seconds before ambulating. Pt c/o 6-7/10 pain after ambulating in room and is requesting pain medication, RN notified. Pt sitting EOB with call bell at his side.   [facility] Bao Coreas Mobility Specialist Mobility Specialist Phone: [phone]

## 2020-11-03 NOTE — Progress Notes (Signed)
OT Cancellation Note  Patient Details Name: Patrick Anthony MRN: [id number] DOB: 08/26/1970   Cancelled Treatment:    Reason Eval/Treat Not Completed: Patient at procedure or test/ unavailable;Other (comment) pt in HD, will check back as time allows.  [name], COTA/L Acute Rehabilitation Services [phone]   [name] 11/03/2020, 10:48 AM

## 2020-11-03 NOTE — Progress Notes (Signed)
Patient is postop day 5 status post above-knee amputation patient seen on dialysis   Seems more comfortable today has an additional 100 cc of bloody drainage in the wound VAC.  Wound VAC is functioning fairly well with 1 green check.  Continue wound VAC therapy will need 1 week follow-up in our office

## 2020-11-03 NOTE — Progress Notes (Signed)
PROGRESS NOTE    Patrick Anthony  [id number] DOB: 1971/05/19 DOA: 09/27/2020 PCP: [name], MD   Chief Complaint  Patient presents with  . 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  Seen by ID managed with IV antibiotic, CT surgery consult salt was obtained subsequently underwent minimally invasive mitral valve replacement on 10/14/2020 and was managed under CT surgery service and subsequently transferred to [facility] on 3/16.  Patient was followed by ID, orthopedics.  He was found to have swelling on the right leg and seen by orthopedic underwent debridement on the right leg and treated for large complicated abscess with osteomyelitis involving his tibia with 7 by X.3 centimeter area of bone that was excised. Patient was seen by Dr. Sharol Given orthopedic ID and the right above-the-knee amputation has been recommended moving forward to manage this infection. Patient has been told by multiple providers multiple times the need for the above-the-knee amputation to avoid seeding of his new valve and his graft but has been reluctant and he is aware about dying from staph aureus bacteremia and sepsis. 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 He has had significant anemia in the setting of surgery and wound VAC with bloody drainage  Subjective: Seen and examined this morning in dialysis.   Patient has no new  complaints. Reports pain is controlled.  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 Sharol Given  Cont vancomycin w/ HD- duration 10/14/20 > 11/25/2020 as per ID. Repeat blood cultures from 10/13/20 are negative. Patient is status post right AKA 10/29/20-although he had refused initially. Continue to wean down IV narcotics continue to encourage use of oral oxycodone and oral Dilaudid, Tylenol, Neurontin Robaxin.  Awaiting for skilled nursing facility. Still has leukocytosis but afebrile.  Will be monitored closely.  Status post right AKA continue pain management and wound care as above, wound VAC in place-with bloody drainage likely contributing to anemia.   ESRD and HD MWF: Getting HD this morning.  Nephrology on board.  Hyperkalemia: Resolved Recent Labs  Lab 10/31/20 0224 10/31/20 0741 11/01/20 0100 11/02/20 0100 11/03/20 0310  K 4.0 4.2 4.1 3.8 3.4*   Secondary hyperparathyroidism-calcium and phosphorus stable continue Sensipar binders and Hectorol per nephrology.  Monitor lab  Acute blood loss anemia in the setting of surgery, and wound VAC bloody draiange Anemia of chronic kidney disease S/p 3 units PRBC, also on aranesp and dose was increased.  No iron due to infection.  Monitor and transfuse.   Recent Labs  Lab 10/30/20 0157 10/31/20 0224 11/01/20 0100 11/02/20 0100 11/03/20 0310  HGB 8.1* 7.7* 7.0* 7.6* 7.6*  HCT 26.3* 25.3* 21.9* 25.0* 25.5*  History of hepatitis C/liver cirrhosis-outpatient follow-up with ID.Monitor LFTs intermittently.  Monitor labs. Recent Labs  Lab 10/30/20 0157 10/31/20 0224 11/01/20 0100 11/02/20 0100 11/03/20 0310   ALBUMIN 2.4* 2.3* 2.1* 2.1* 2.2*   Hyponatremia l overall is stable.  Continue HD,monitor labs.  Hypertension fairly controlled on metoprolol and clonidine patch.  Nasal bleeding on 3/20 resolved.  Diet Order            Diet renal with fluid restriction Fluid restriction: 1200 mL Fluid; Room service appropriate? Yes; Fluid consistency: Thin  Diet effective now                 Nutrition Problem: Increased nutrient needs Etiology: chronic illness (ESRD on HD) Signs/Symptoms: estimated needs Interventions: Nepro shake,Refer to RD note for recommendations Patient's Body mass index is 21.31 kg/m.  DVT prophylaxis: SCDs Start: 10/29/20 1639 SCDs Start: 10/20/20 1721 enoxaparin (LOVENOX) injection 30 mg Start: 10/19/20 0800 SCDs Start: 10/15/20 0610 Code Status:   Code Status: Full Code  Family Communication:plan of care discussed with patient at bedside. I had discussed with [name] extensively 3/23  Status CD:[id number] Remains inpatient appropriate because:IV treatments appropriate due to intensity of illness or inability to take PO and Inpatient level of care appropriate due to severity of illness  Dispo:The patient is from:Home            Anticipated d/c is to:[facility] not a candidate per CIR and plan is for SNF.  Awaiting placement            Patient currently is not medically stable to d/c.            Difficult to place patient No  Unresulted Labs (From admission, onward)          Start     Ordered   10/21/20 0500  Renal function panel  Daily,   R     Question:  Specimen collection method  Answer:  Lab=Lab collect   10/20/20 2128   10/21/20 0500  CBC with Differential/Platelet  Daily,   R     Question:  Specimen collection method  Answer:  Lab=Lab collect   10/20/20 2128   Signed and Held  Renal function panel  Once,   R       Question:  Specimen collection method  Answer:  Lab=Lab collect   Signed and Held         Medications reviewed:  Scheduled  Meds: . (feeding supplement) PROSource Plus  30 mL Oral BID BM  . aspirin EC  325 mg Oral Daily  . calcium acetate  667 mg Oral TID WC  . Chlorhexidine Gluconate Cloth  6 each Topical Q0600  . cinacalcet  60 mg Oral Q breakfast  . cloNIDine  0.3 mg Transdermal Weekly  . [START ON 11/08/2020] darbepoetin (ARANESP) injection - DIALYSIS  100 mcg Intravenous Q Mon-HD  . docusate sodium  100 mg Oral BID  . enoxaparin (LOVENOX) injection  30 mg Subcutaneous Q48H  . feeding supplement (NEPRO CARB STEADY)  237 mL Oral TID BM  . gabapentin  300 mg Oral QHS  . influenza vac split quadrivalent PF  0.5 mL Intramuscular Tomorrow-1000  . metoprolol tartrate  50 mg Oral BID  . multivitamin  1 tablet Oral QHS  . oxymetazoline  1 spray Each Nare BID  . pantoprazole  40 mg Oral Daily  . senna-docusate  1 tablet Oral BID  . sodium chloride flush  3 mL Intravenous Q12H  Continuous Infusions: . sodium chloride    . sodium chloride 10 mL/hr at 10/29/20 1351  . sodium chloride    . methocarbamol (ROBAXIN) IV    . vancomycin 750 mg (11/03/20 1023)   Consultants:see note  Procedures:see note  Antimicrobials: Anti-infectives (From admission, onward)   Start     Dose/Rate Route Frequency Ordered Stop   10/30/20 1100  vancomycin (VANCOCIN) IVPB 750 mg/150 ml premix        750 mg 150 mL/hr over 60 Minutes Intravenous Once in dialysis 10/30/20 0833 10/30/20 1317   10/29/20 1000  ceFAZolin (ANCEF) IVPB 2g/100 mL premix  Status:  Discontinued        2 g 200 mL/hr over 30 Minutes Intravenous To Short Stay 10/29/20 0730 10/29/20 1634   10/27/20 2015  vancomycin (VANCOCIN) IVPB 750 mg/150 ml premix        750 mg 150 mL/hr over 60 Minutes Intravenous Every M-W-F (Hemodialysis) 10/27/20 2005     10/25/20 0922  vancomycin (VANCOCIN) 500-5 MG/100ML-% IVPB       Note to Pharmacy: Kalman Shan   : cabinet override      10/25/20 0922 10/25/20 0958   10/21/20 0600  ceFAZolin (ANCEF) IVPB 2g/100 mL premix        2  g 200 mL/hr over 30 Minutes Intravenous On call to O.R. 10/20/20 1349 10/21/20 0650   10/20/20 2243  vancomycin (VANCOCIN) 500-5 MG/100ML-% IVPB       Note to Pharmacy: Wallace Cullens   : cabinet override      10/20/20 2243 10/20/20 2243   10/20/20 1437  ceFAZolin (ANCEF) 2-4 GM/100ML-% IVPB       Note to Pharmacy: Ladoris Gene   : cabinet override      10/20/20 1437 10/20/20 1612   10/20/20 1200  vancomycin (VANCOREADY) IVPB 500 mg/100 mL  Status:  Discontinued        500 mg 100 mL/hr over 60 Minutes Intravenous Every M-W-F (Hemodialysis) 10/20/20 0903 10/27/20 2005   10/18/20 1124  vancomycin variable dose per unstable renal function (pharmacist dosing)  Status:  Discontinued         Does not apply See admin instructions 10/18/20 1124 10/20/20 0903   10/16/20 1600  vancomycin (VANCOREADY) IVPB 500 mg/100 mL  Status:  Discontinued        500 mg 100 mL/hr over 60 Minutes Intravenous Every Sat (Hemodialysis) 10/16/20 1306 10/16/20 1312   10/15/20 1500  vancomycin (VANCOCIN) IVPB 500 mg/100 ml premix  Status:  Discontinued        500 mg 100 mL/hr over 60 Minutes Intravenous Every M-W-F (Hemodialysis) 10/15/20 1357 10/17/20 0942   10/14/20 2130  vancomycin (VANCOCIN) IVPB 1000 mg/200 mL premix  Status:  Discontinued        1,000 mg 200 mL/hr over 60 Minutes Intravenous  Once 10/14/20 1403 10/14/20 1547   10/14/20 2130  vancomycin (VANCOREADY) IVPB 500 mg/100 mL        500 mg 100 mL/hr over 60 Minutes Intravenous  Once 10/14/20 1547 10/14/20 2222   10/14/20 1730  cefUROXime (ZINACEF) 1.5 g in sodium chloride 0.9 % 100 mL IVPB        1.5 g 200 mL/hr over 30 Minutes Intravenous Every 24 hr x 2 10/14/20 1403 10/15/20 1718   10/14/20 1128  vancomycin (VANCOCIN) 1,000 mg in sodium chloride 0.9 % 1,000 mL irrigation  Status:  Discontinued          As needed 10/14/20 1128 10/14/20 1401  10/14/20 0400  vancomycin (VANCOREADY) IVPB 1250 mg/250 mL        1,250 mg 166.7 mL/hr over 90 Minutes  Intravenous To Surgery 10/13/20 1039 10/14/20 0910   10/14/20 0400  cefUROXime (ZINACEF) 1.5 g in sodium chloride 0.9 % 100 mL IVPB        1.5 g 200 mL/hr over 30 Minutes Intravenous To Surgery 10/13/20 1039 10/14/20 0835   10/14/20 0400  cefUROXime (ZINACEF) 750 mg in sodium chloride 0.9 % 100 mL IVPB        750 mg 200 mL/hr over 30 Minutes Intravenous To Surgery 10/13/20 1039 10/14/20 1250   10/14/20 0400  vancomycin (VANCOCIN) 1,000 mg in sodium chloride 0.9 % 1,000 mL irrigation  Status:  Discontinued         Irrigation To Surgery 10/13/20 1039 10/14/20 1403   10/13/20 1657  vancomycin (VANCOCIN) 500-5 MG/100ML-% IVPB       Note to Pharmacy: Judieth Keens  : cabinet override      10/13/20 1657 10/13/20 1717   10/12/20 0600  ceFAZolin (ANCEF) IVPB 2g/100 mL premix        2 g 200 mL/hr over 30 Minutes Intravenous On call to O.R. 10/11/20 1841 10/12/20 1315   10/11/20 1415  vancomycin (VANCOCIN) IVPB 500 mg/100 ml premix  Status:  Discontinued        500 mg 100 mL/hr over 60 Minutes Intravenous Every M-W-F (Hemodialysis) 10/11/20 1413 10/14/20 1403   10/11/20 1413  vancomycin (VANCOCIN) 500-5 MG/100ML-% IVPB       Note to Pharmacy: Kalman Shan   : cabinet override      10/11/20 1413 10/12/20 0229   10/11/20 1353  Vancomycin (VANCOCIN) 750-5 MG/150ML-% IVPB  Status:  Discontinued       Note to Pharmacy: Kalman Shan   : cabinet override      10/11/20 1353 10/11/20 1417   10/08/20 1200  vancomycin (VANCOCIN) IVPB 750 mg/150 ml premix  Status:  Discontinued        750 mg 150 mL/hr over 60 Minutes Intravenous Every M-W-F (Hemodialysis) 10/05/20 1404 10/11/20 1413   10/06/20 1619  vancomycin (VANCOCIN) 500-5 MG/100ML-% IVPB       Note to Pharmacy: Judieth Keens  : cabinet override      10/06/20 1619 10/06/20 1736   10/06/20 1200  vancomycin (VANCOCIN) IVPB 750 mg/150 ml premix  Status:  Discontinued        750 mg 150 mL/hr over 60 Minutes Intravenous Every M-W-F (Hemodialysis)  10/04/20 0905 10/05/20 1404   10/06/20 1200  vancomycin (VANCOCIN) IVPB 500 mg/100 ml premix        500 mg 100 mL/hr over 60 Minutes Intravenous Every Wed (Hemodialysis) 10/05/20 1404 10/06/20 2000   10/01/20 2000  DAPTOmycin (CUBICIN) 840 mg in sodium chloride 0.9 % IVPB  Status:  Discontinued        840 mg 233.6 mL/hr over 30 Minutes Intravenous Every 48 hours 10/01/20 0922 10/01/20 1056   10/01/20 1200  vancomycin (VANCOCIN) IVPB 1000 mg/200 mL premix  Status:  Discontinued        1,000 mg 200 mL/hr over 60 Minutes Intravenous Every M-W-F (Hemodialysis) 10/01/20 1056 10/04/20 0845   09/29/20 1200  vancomycin (VANCOCIN) IVPB 1000 mg/200 mL premix  Status:  Discontinued        1,000 mg 200 mL/hr over 60 Minutes Intravenous Every M-W-F (Hemodialysis) 09/27/20 1443 10/01/20 0922   09/29/20 1200  ceFEPIme (MAXIPIME) 2 g in sodium chloride 0.9 % 100  mL IVPB  Status:  Discontinued        2 g 200 mL/hr over 30 Minutes Intravenous Every M-W-F (Hemodialysis) 09/27/20 1443 09/28/20 1054   09/28/20 0900  metroNIDAZOLE (FLAGYL) IVPB 500 mg  Status:  Discontinued        500 mg 100 mL/hr over 60 Minutes Intravenous Every 12 hours 09/27/20 1808 09/28/20 1054   09/28/20 0900  ceFEPIme (MAXIPIME) 1 g in sodium chloride 0.9 % 100 mL IVPB  Status:  Discontinued        1 g 200 mL/hr over 30 Minutes Intravenous Every 12 hours 09/27/20 1808 09/27/20 1812   09/27/20 1445  vancomycin (VANCOREADY) IVPB 2000 mg/400 mL        2,000 mg 200 mL/hr over 120 Minutes Intravenous  Once 09/27/20 1433 09/27/20 2330   09/27/20 1430  ceFEPIme (MAXIPIME) 2 g in sodium chloride 0.9 % 100 mL IVPB        2 g 200 mL/hr over 30 Minutes Intravenous  Once 09/27/20 1418 09/27/20 1541   09/27/20 1430  metroNIDAZOLE (FLAGYL) IVPB 500 mg        500 mg 100 mL/hr over 60 Minutes Intravenous  Once 09/27/20 1418 09/27/20 2214   09/27/20 1430  vancomycin (VANCOREADY) IVPB 1750 mg/350 mL  Status:  Discontinued        1,750 mg 175 mL/hr  over 120 Minutes Intravenous  Once 09/27/20 1418 09/27/20 1433     Culture/Microbiology    Component Value Date/Time   SDES TISSUE 10/20/2020 1612   SPECREQUEST RIGHT LEG ABSC SPEC A 10/20/2020 1612   CULT  10/20/2020 1612    FEW METHICILLIN RESISTANT STAPHYLOCOCCUS AUREUS CRITICAL RESULT CALLED TO, READ BACK BY AND VERIFIED WITH: RN N.CROSFON AT 0954 ON 10/22/2020 BY T.SAAD NO ANAEROBES ISOLATED Performed at [facility], [place] [address], [place], [place] [address]    REPTSTATUS 10/26/2020 FINAL 10/20/2020 1612    Other culture-see note  Objective: Vitals: Today's Vitals   11/03/20 1000 11/03/20 1030 11/03/20 1100 11/03/20 1125  BP: (!) 139/91 (!) 137/99 (!) 132/93 (!) 146/98  Pulse:   91 91  Resp:    16  Temp:    97.7 F (36.5 C)  TempSrc:    Oral  SpO2:    96%  Weight:    71.3 kg  Height:      PainSc:    6     Intake/Output Summary (Last 24 hours) at 11/03/2020 1155 Last data filed at 11/03/2020 1125 Gross per 24 hour  Intake 480 ml  Output 2050 ml  Net -1570 ml   Filed Weights   11/03/20 0320 11/03/20 0745 11/03/20 1125  Weight: 74 kg 73.6 kg 71.3 kg   Weight change: 0.827 kg  Intake/Output from previous day: 03/29 0701 - 03/30 0700 In: 480 [P.O.:480] Out: 50 [Drains:50] Intake/Output this shift: Total I/O In: -  Out: 2000 [Other:2000] Filed Weights   11/03/20 0320 11/03/20 0745 11/03/20 1125  Weight: 74 kg 73.6 kg 71.3 kg   Examination: General exam: AAOx3 , NAD, weak appearing. HEENT:Oral mucosa moist, Ear/Nose WNL grossly, dentition normal. Respiratory system: bilaterally clear,no wheezing or crackles,no use of accessory muscle Cardiovascular system: S1 & S2 +, No JVD,. Gastrointestinal system: Abdomen soft, NT,ND, BS+ Nervous System:Alert, awake, moving extremities and grossly nonfocal Extremities:  Rt AKA STUMP w/ wound vac in place. Skin: No rashes,no icterus. MSK: Normal muscle bulk,tone, power  Data Reviewed: I have personally  reviewed following labs and imaging studies CBC:  Recent Labs  Lab 10/30/20 0157 10/31/20 0224 11/01/20 0100 11/02/20 0100 11/03/20 0310  WBC 21.8* 15.9* 17.4* 17.3* 18.1*  NEUTROABS 19.2* 11.2* 14.0* 13.4* 13.7*  HGB 8.1* 7.7* 7.0* 7.6* 7.6*  HCT 26.3* 25.3* 21.9* 25.0* 25.5*  MCV 90.4 90.7 89.8 89.3 89.2  PLT 440* 360 377 361 [id number]*   Basic Metabolic Panel: Recent Labs  Lab 10/30/20 0157 10/30/20 0739 10/31/20 0224 10/31/20 0741 11/01/20 0100 11/02/20 0100 11/03/20 0310  NA 128*  --  131*  --  128* 132* 128*  K 6.4*   < > 4.0 4.2 4.1 3.8 3.4*  CL 93*  --  91*  --  90* 95* 90*  CO2 22  --  28  --  '26 26 24  '$ GLUCOSE 98  --  97  --  120* 108* 94  BUN 51*  --  29*  --  39* 17 32*  CREATININE 8.77*  --  5.37*  --  7.31* 4.63* 6.79*  CALCIUM 8.4*  --  8.5*  --  8.0* 8.3* 8.5*  PHOS 5.3*  --  4.8*  --  5.5* 3.3 4.2   < > = values in this interval not displayed.   GFR: Estimated Creatinine Clearance: 13.3 mL/min (A) (by C-G formula based on SCr of 6.79 mg/dL (H)). Liver Function Tests: Recent Labs  Lab 10/30/20 0157 10/31/20 0224 11/01/20 0100 11/02/20 0100 11/03/20 0310  ALBUMIN 2.4* 2.3* 2.1* 2.1* 2.2*   No results for input(s): LIPASE, AMYLASE in the last 168 hours. No results for input(s): AMMONIA in the last 168 hours. Coagulation Profile: No results for input(s): INR, PROTIME in the last 168 hours. Cardiac Enzymes: No results for input(s): CKTOTAL, CKMB, CKMBINDEX, TROPONINI in the last 168 hours. BNP (last 3 results) No results for input(s): PROBNP in the last 8760 hours. HbA1C: No results for input(s): HGBA1C in the last 72 hours. CBG: No results for input(s): GLUCAP in the last 168 hours. Lipid Profile: No results for input(s): CHOL, HDL, LDLCALC, TRIG, CHOLHDL, LDLDIRECT in the last 72 hours. Thyroid Function Tests: No results for input(s): TSH, T4TOTAL, FREET4, T3FREE, THYROIDAB in the last 72 hours. Anemia Panel: No results for input(s):  VITAMINB12, FOLATE, FERRITIN, TIBC, IRON, RETICCTPCT in the last 72 hours. Sepsis Labs: No results for input(s): PROCALCITON, LATICACIDVEN in the last 168 hours.  No results found for this or any previous visit (from the past 240 hour(s)). Radiology Studies: No results found.  LOS: 87 days   Antonieta Pert, MD [facility]  11/03/2020, 11:55 AM

## 2020-11-03 NOTE — TOC Progression Note (Signed)
Transition of Care [facility]) - Progression Note    Patient Details  Name: Patrick Anthony MRN: [id number] Date of Birth: October 31, 1970  Transition of Care [facility]) CM/SW [place], [place] Phone Number: 11/03/2020, 3:16 PM  Clinical Narrative:     Patient has no bed offers.  Barrier- inadequate or no insurance - dialysis needs   Expected Discharge Plan: [place] Barriers to Discharge: Continued Medical Work up,SNF Pending bed offer  Expected Discharge Plan and Services Expected Discharge Plan: [place] In-house Referral: Clinical Social Work Discharge Planning Services: [employer] Consult Post Acute Care Choice: [place] Living arrangements for the past 2 months: Apartment                                       Social Determinants of Health (SDOH) Interventions    Readmission Risk Interventions No flowsheet data found.

## 2020-11-03 NOTE — Progress Notes (Signed)
Pharmacy Antibiotic Note  Patrick Anthony is a 50 y.o. male admitted on 09/27/2020 with fever, chills, and generalized weakness.  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  Continues on Vancomycin per pharmacy.  Pt has ESRD with HD MWF. Plans are for 6 weeks of antibiotics with day 1 being post-op day 1 from valve surgery per 3/7 ID note 3/10 > (4/21). 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   Patient's HD on 3/25 did get bumped to 3/26 due to his surgery but otherwise has remained on schedule. Tolerating 3.5-4 hours at BFR 350-400.  Vancomycin random this AM therapeutic at 18 mcg/ml (goal 15-25 mcg/ml).  Continue with vancomycin '750mg'$  IV Q-HD  Plan: -Continue vancomycin 750 mg IV qHD -Continue following HD schedule/duration  Height: 6' 0.01" (182.9 cm) Weight: 74 kg (163 lb 3.2 oz) IBW/kg (Calculated) : 77.62  Temp (24hrs), Avg:98.2 F (36.8 C), Min:97.8 F (36.6 C), Max:98.5 F (36.9 C)  Recent Labs  Lab 10/27/20 1748 10/28/20 0152 10/30/20 0157 10/31/20 0224 11/01/20 0100 11/02/20 0100 11/03/20 0310  WBC  --    < > 21.8* 15.9* 17.4* 17.3* 18.1*  CREATININE  --    < > 8.77* 5.37* 7.31* 4.63* 6.79*  VANCORANDOM 15  --   --   --   --   --  18   < > = values in this interval not displayed.    Estimated Creatinine Clearance: 13.8 mL/min (A) (by C-G formula based on SCr of 6.79 mg/dL (H)).    No Known Allergies  Antimicrobials this admission: Vanc 2/21>> Cefepime 2/21>>2/22 Flagyl 2/21>>2/22 (2 doses given)  Dose adjustments this admission: 2/28 Pre-HD VR 41 - estimate post HD level 27 Dose held 2/28 and reduced to '750mg'$  IV qHD starting 3/2 3/7 pre HD VR= 29; change vanc to '500mg'$  w/ HD 3/13 Post-HD VR 29 - no additional dose (did get additional dose of vanc around time of surgery) 3/16 Pre-HD VR 20 - continue with 500 IV qHD 3/17 Pre-HD VR 15 - incr to 750 IV qHD 3/30 VR 18- continue with '750mg'$  IV qHD  Microbiology results: 2/21 blood x 2:  MRSA, sens Vanc MIC < 0.5, Septra, TCN, gent MIC < 0.5, Clinda 2/22 BCID: MRSA  2/21 COVID and flu: negative 2/21 HIV: non-reactive 2/24 blood x 2: 1/2 SA 2/27 blood x 2: neg  Thank you for involving pharmacy in this patient's care.  Dimple Nanas, PharmD PGY-1 Acute Care Pharmacy Resident Office: [phone] 11/03/2020 6:57 AM  **Pharmacist phone directory can be found on [email or web address] listed under [place]**

## 2020-11-03 NOTE — Progress Notes (Addendum)
Subjective: On hemodialysis complaints  of some right BKA discomfort  Objective Vital signs in last 24 hours: Vitals:   11/03/20 0805 11/03/20 0841 11/03/20 0910 11/03/20 0930  BP: 114/89 (!) 128/96 (!) 135/113 (!) 143/114  Pulse:    89  Resp: '15 13 18   '$ Temp:      TempSrc:      SpO2:      Weight:      Height:       Weight change: 0.827 kg  Physical Exam: General: Alert dialysis 80 Heart: RRR, soft 1/6 SEM, no rub or gallop Lungs: CTA nonlabored breathing Abdomen: BS positive, soft NTND Extremities: No pedal edema , R AKA wound VAC present  HD access =patent on HD LUA AVG    OP HD: MWF [facility]    4h 350/500 new edw here 73- 75kg (was82.5kg)2/2.5 bath LUA AVGHep 5000 -Aranesp 45mgIVq 2wks - last 09/27/20 (Verified order) -Hectorol314m IV qHD    Problem/Plan: 1. Anterior shinosteomyelitis:status post I&D per orthopedics. Pathology showed staph aureus in the tibia. 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 SP min invasive MVR(bioprosthetic)replacement - on 3/10. Doing well from cardiac standpoint 3. 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 Underwent tooth extraction3/8/22.PerID to get 6 wks IV vanc w/ end date of 11/25/20. 4. ESRD - usual HDMWF.HD today on schedule   now  5. HTN/volume - continues on clonidine patch and metoprolol 25 bid. New lower dry wt here about 73- 75kg.  6. Anemiaof CKD-HGB 7.6 range. S/P 2 units of PRBCs 3/10.Darbe 100ug given here on 3/16, then lowered to 60ug weekly, needs back to 100 next dose Monday 04/04 ,no IV Fe due to osteomyelitis.  7. Secondary Hyperparathyroidism -Calcium and phosphorus controlled. Continue sensipar, binders, and hectorol 8. Hx Hep C /liver cirrhosis.  DaErnest HaberPA-C [facility] Beeper [id number]/30/2022,9:51 AM  LOS: 37 days   Labs: Basic Metabolic Panel: Recent Labs  Lab 11/01/20 0100 11/02/20 0100 11/03/20 0310  NA 128*  132* 128*  K 4.1 3.8 3.4*  CL 90* 95* 90*  CO2 '26 26 24  '$ GLUCOSE 120* 108* 94  BUN 39* 17 32*  CREATININE 7.31* 4.63* 6.79*  CALCIUM 8.0* 8.3* 8.5*  PHOS 5.5* 3.3 4.2   Liver Function Tests: Recent Labs  Lab 11/01/20 0100 11/02/20 0100 11/03/20 0310  ALBUMIN 2.1* 2.1* 2.2*   No results for input(s): LIPASE, AMYLASE in the last 168 hours. No results for input(s): AMMONIA in the last 168 hours. CBC: Recent Labs  Lab 10/30/20 0157 10/31/20 0224 11/01/20 0100 11/02/20 0100 11/03/20 0310  WBC 21.8* 15.9* 17.4* 17.3* 18.1*  NEUTROABS 19.2* 11.2* 14.0* 13.4* 13.7*  HGB 8.1* 7.7* 7.0* 7.6* 7.6*  HCT 26.3* 25.3* 21.9* 25.0* 25.5*  MCV 90.4 90.7 89.8 89.3 89.2  PLT 440* 360 377 361 410*   Cardiac Enzymes: No results for input(s): CKTOTAL, CKMB, CKMBINDEX, TROPONINI in the last 168 hours. CBG: No results for input(s): GLUCAP in the last 168 hours.  Studies/Results: No results found. Medications: . sodium chloride    . sodium chloride 10 mL/hr at 10/29/20 1351  . sodium chloride    . methocarbamol (ROBAXIN) IV    . Vancomycin    . vancomycin Stopped (11/01/20 1704)   . (feeding supplement) PROSource Plus  30 mL Oral BID BM  . aspirin EC  325 mg Oral Daily  . calcium acetate  667 mg Oral TID WC  . Chlorhexidine Gluconate Cloth  6  each Topical [id number]  . cinacalcet  60 mg Oral Q breakfast  . cloNIDine  0.3 mg Transdermal Weekly  . [START ON 11/08/2020] darbepoetin (ARANESP) injection - DIALYSIS  100 mcg Intravenous Q Mon-HD  . docusate sodium  100 mg Oral BID  . enoxaparin (LOVENOX) injection  30 mg Subcutaneous Q48H  . feeding supplement (NEPRO CARB STEADY)  237 mL Oral TID BM  . gabapentin  300 mg Oral QHS  . influenza vac split quadrivalent PF  0.5 mL Intramuscular Tomorrow-1000  . metoprolol tartrate  50 mg Oral BID  . multivitamin  1 tablet Oral QHS  . oxymetazoline  1 spray Each Nare BID  . pantoprazole  40 mg Oral Daily  . senna-docusate  1 tablet Oral BID  .  sodium chloride flush  3 mL Intravenous Q12H

## 2020-11-04 DIAGNOSIS — N186 End stage renal disease: Secondary | ICD-10-CM | POA: Diagnosis not present

## 2020-11-04 DIAGNOSIS — D631 Anemia in chronic kidney disease: Secondary | ICD-10-CM | POA: Diagnosis not present

## 2020-11-04 DIAGNOSIS — Z992 Dependence on renal dialysis: Secondary | ICD-10-CM | POA: Diagnosis not present

## 2020-11-04 DIAGNOSIS — R7881 Bacteremia: Secondary | ICD-10-CM | POA: Diagnosis not present

## 2020-11-04 DIAGNOSIS — I63119 Cerebral infarction due to embolism of unspecified vertebral artery: Secondary | ICD-10-CM | POA: Diagnosis not present

## 2020-11-04 DIAGNOSIS — R0602 Shortness of breath: Secondary | ICD-10-CM | POA: Diagnosis not present

## 2020-11-04 DIAGNOSIS — M792 Neuralgia and neuritis, unspecified: Secondary | ICD-10-CM

## 2020-11-04 DIAGNOSIS — I330 Acute and subacute infective endocarditis: Secondary | ICD-10-CM | POA: Diagnosis not present

## 2020-11-04 DIAGNOSIS — I5032 Chronic diastolic (congestive) heart failure: Secondary | ICD-10-CM | POA: Diagnosis not present

## 2020-11-04 DIAGNOSIS — Z89611 Acquired absence of right leg above knee: Secondary | ICD-10-CM

## 2020-11-04 DIAGNOSIS — I132 Hypertensive heart and chronic kidney disease with heart failure and with stage 5 chronic kidney disease, or end stage renal disease: Secondary | ICD-10-CM | POA: Diagnosis not present

## 2020-11-04 DIAGNOSIS — A419 Sepsis, unspecified organism: Secondary | ICD-10-CM | POA: Diagnosis not present

## 2020-11-04 DIAGNOSIS — I150 Renovascular hypertension: Secondary | ICD-10-CM | POA: Diagnosis not present

## 2020-11-04 DIAGNOSIS — R5081 Fever presenting with conditions classified elsewhere: Secondary | ICD-10-CM | POA: Diagnosis not present

## 2020-11-04 DIAGNOSIS — B9561 Methicillin susceptible Staphylococcus aureus infection as the cause of diseases classified elsewhere: Secondary | ICD-10-CM | POA: Diagnosis not present

## 2020-11-04 DIAGNOSIS — N2581 Secondary hyperparathyroidism of renal origin: Secondary | ICD-10-CM | POA: Diagnosis not present

## 2020-11-04 DIAGNOSIS — J189 Pneumonia, unspecified organism: Secondary | ICD-10-CM | POA: Diagnosis not present

## 2020-11-04 DIAGNOSIS — Z9889 Other specified postprocedural states: Secondary | ICD-10-CM | POA: Diagnosis not present

## 2020-11-04 DIAGNOSIS — R531 Weakness: Secondary | ICD-10-CM | POA: Diagnosis not present

## 2020-11-04 DIAGNOSIS — I639 Cerebral infarction, unspecified: Secondary | ICD-10-CM | POA: Diagnosis not present

## 2020-11-04 LAB — RENAL FUNCTION PANEL
Albumin: 1.9 g/dL — ABNORMAL LOW (ref 3.5–5.0)
Anion gap: 10 (ref 5–15)
BUN: 19 mg/dL (ref 6–20)
CO2: 26 mmol/L (ref 22–32)
Calcium: 7.7 mg/dL — ABNORMAL LOW (ref 8.9–10.3)
Chloride: 95 mmol/L — ABNORMAL LOW (ref 98–111)
Creatinine, Ser: 4.55 mg/dL — ABNORMAL HIGH (ref 0.61–1.24)
GFR, Estimated: 15 mL/min — ABNORMAL LOW (ref >60)
Glucose, Bld: 124 mg/dL — ABNORMAL HIGH (ref 70–99)
Phosphorus: 3 mg/dL (ref 2.5–4.6)
Potassium: 3.5 mmol/L (ref 3.5–5.1)
Sodium: 131 mmol/L — ABNORMAL LOW (ref 135–145)

## 2020-11-04 LAB — CBC WITH DIFFERENTIAL/PLATELET
Abs Immature Granulocytes: 0.14 10*3/uL — ABNORMAL HIGH (ref 0.00–0.07)
Basophils Absolute: 0 10*3/uL (ref 0.0–0.1)
Basophils Relative: 0 %
Eosinophils Absolute: 0.5 10*3/uL (ref 0.0–0.5)
Eosinophils Relative: 3 %
HCT: 23.5 % — ABNORMAL LOW (ref 39.0–52.0)
Hemoglobin: 7.1 g/dL — ABNORMAL LOW (ref 13.0–17.0)
Immature Granulocytes: 1 %
Lymphocytes Relative: 9 %
Lymphs Abs: 1.2 10*3/uL (ref 0.7–4.0)
MCH: 27.3 pg (ref 26.0–34.0)
MCHC: 30.2 g/dL (ref 30.0–36.0)
MCV: 90.4 fL (ref 80.0–100.0)
Monocytes Absolute: 2.2 10*3/uL — ABNORMAL HIGH (ref 0.1–1.0)
Monocytes Relative: 15 %
Neutro Abs: 10.1 10*3/uL — ABNORMAL HIGH (ref 1.7–7.7)
Neutrophils Relative %: 72 %
Platelets: 390 10*3/uL (ref 150–400)
RBC: 2.6 MIL/uL — ABNORMAL LOW (ref 4.22–5.81)
RDW: 18.6 % — ABNORMAL HIGH (ref 11.5–15.5)
WBC: 14.1 10*3/uL — ABNORMAL HIGH (ref 4.0–10.5)
nRBC: 0 % (ref 0.0–0.2)

## 2020-11-04 MED ORDER — OXYCODONE HCL ER 10 MG PO T12A
10.0000 mg | EXTENDED_RELEASE_TABLET | Freq: Two times a day (BID) | ORAL | Status: DC
  Administered 2020-11-04 (×2): 10 mg via ORAL
  Filled 2020-11-04 (×2): qty 1

## 2020-11-04 MED ORDER — CHLORHEXIDINE GLUCONATE CLOTH 2 % EX PADS
6.0000 | MEDICATED_PAD | Freq: Every day | CUTANEOUS | Status: DC
  Administered 2020-11-04 – 2020-11-07 (×4): 6 via TOPICAL

## 2020-11-04 MED ORDER — HYDROMORPHONE HCL 1 MG/ML IJ SOLN: 0.5000 mg | INTRAMUSCULAR | Status: DC | PRN

## 2020-11-04 MED ORDER — HYDROMORPHONE HCL 2 MG PO TABS
2.0000 mg | ORAL_TABLET | ORAL | Status: DC | PRN
  Administered 2020-11-04 – 2020-11-10 (×22): 2 mg via ORAL
  Filled 2020-11-04 (×23): qty 1

## 2020-11-04 MED ORDER — ENOXAPARIN SODIUM 30 MG/0.3ML ~~LOC~~ SOLN
30.0000 mg | SUBCUTANEOUS | Status: DC
  Administered 2020-11-05 – 2020-11-09 (×5): 30 mg via SUBCUTANEOUS
  Filled 2020-11-04 (×5): qty 0.3

## 2020-11-04 MED ORDER — PREGABALIN 75 MG PO CAPS
75.0000 mg | ORAL_CAPSULE | Freq: Every day | ORAL | Status: DC
  Administered 2020-11-04 – 2020-11-10 (×7): 75 mg via ORAL
  Filled 2020-11-04 (×7): qty 1

## 2020-11-04 NOTE — Progress Notes (Signed)
Nutrition Follow-up  DOCUMENTATION CODES:   Not applicable  INTERVENTION:   Continue Nepro Shake po TID, each supplement provides 425 kcal and 19 grams protein.  Continue Rena-Vite daily.  NUTRITION DIAGNOSIS:   Increased nutrient needs related to chronic illness (ESRD on HD) as evidenced by estimated needs.  Ongoing  GOAL:   Patient will meet greater than or equal to 90% of their needs   Progressing   MONITOR:   PO intake,Supplement acceptance,Skin,I & O's,Diet advancement,Labs,Weight trends  ASSESSMENT:  57 YOM admitted for pneumonia d/t infectious agent. PMH of cirrhosis of liver, drug abuse, ETOH abuse, anemia, CHF, Hepatitis C, HTN, ESRD on HD. Recent onset of sepsis d/t MRSA, acute CVA.   3/10 - s/p minimally invasive mitral valve replacement 3/16 - s/p debridement of a large purulent abscess anterior right tibia and placement of wound VAC 3/31 - s/p R AKA with VAC application   Intake remains stable. Last five meal completions charted as 75-100%. Patient reports he does not like some food options but for the most part can find something he likes. Taking 3-4 Nepros daily. Does not like ProSource.   EDW: 82.5 kg (new EDW this admission 73-75 kg) Current weight: 71.3 kg  Medications: phoslo, sensipar, aranesp, colace, senokot Labs: Na 131 (L)   Flowsheet Row Most Recent Value  Orbital Region No depletion  Upper Arm Region No depletion  Thoracic and Lumbar Region Unable to assess  Buccal Region Mild depletion  Temple Region No depletion  Clavicle Bone Region No depletion  Clavicle and Acromion Bone Region No depletion  Scapular Bone Region No depletion  Dorsal Hand No depletion  Patellar Region Unable to assess  Anterior Thigh Region Unable to assess  Posterior Calf Region Unable to assess  Edema (RD Assessment) None  Hair Reviewed  Eyes Reviewed  [pale conjunctiva]  Mouth Reviewed  Skin Reviewed  Nails Reviewed     Diet Order:   Diet Order             Diet renal with fluid restriction Fluid restriction: 1200 mL Fluid; Room service appropriate? Yes; Fluid consistency: Thin  Diet effective now                EDUCATION NEEDS:  Education needs have been addressed (RD discussed with pt protein's importance and protein sources)  Skin:  Skin Assessment: Skin Integrity Issues: Skin Integrity Issues:: Incisions: R chest, lip, R groin  Last BM:  3/31  Height:  Ht Readings from Last 1 Encounters:  10/20/20 6' 0.01" (1.829 m)   Weight:  Wt Readings from Last 1 Encounters:  11/03/20 71.3 kg   Ideal Body Weight:  81 kg  BMI:  Body mass index is 21.31 kg/m.  Estimated Nutritional Needs:   Kcal:  2200-2400   Protein:  105-120 g   Fluid:  1000 ml + UOP  Mariana Single RD, LDN Clinical Nutrition Pager listed in [place]

## 2020-11-04 NOTE — Progress Notes (Addendum)
Mobility Specialist: Progress Note   11/04/20 1637  Mobility  Activity Ambulated in room  Level of Assistance Contact guard assist, steadying assist  Assistive Device Front wheel walker  Distance Ambulated (ft) 84 ft (56' then 28')  Mobility Response Tolerated well  Mobility performed by Mobility specialist  $Mobility charge 1 Mobility   Post-Mobility: 91 HR  Pt c/o 5/10 pain in RLE during ambulation. Pt otherwise asx. Pt took one seated break due to fatigue and RLE tightness lasting 1 minute. Pt sitting EOB after session per request with call bell at his side.   [facility] Patrick Anthony Mobility Specialist Mobility Specialist Phone: [phone]

## 2020-11-04 NOTE — Progress Notes (Signed)
Benefit's check placed with [facility] for Lyrica for cost of co-pay.

## 2020-11-04 NOTE — Progress Notes (Signed)
Physical Therapy Treatment Patient Details Name: Patrick Anthony MRN: [id number] DOB: [birth year] Today's Date: 11/04/2020    History of Present Illness Pt is a 50 y.o. male admitted 09/27/20 with fever, chest pain, SOB, generalized weakness. Found to have MRSA bacteremia. 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 ECHO with mitral valve vegetation. No significant CAD on LHC. S/p minimally invasive MVR with closure of PFO on 3/10. 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 PMH includes CKD (HD MWF), HTN, CHF, Hep C, polysubstance abuse.    PT Comments    Pt received in supine with sig other present, pt agreeable to therapy session and with good participation and tolerance for mobility. Reviewed supine/standing RLE therapeutic exercises and pt performed AROM with good tolerance as detailed below. Pt consistently min guard for transfer and gait training, RN notified pt reporting severe RLE pain at end of session. Also reviewed phantom pain reduction techniques, exercises and positioning to prevent contracture. Pt continues to benefit from PT services to progress toward functional mobility goals. Continue to recommend SNF.   Follow Up Recommendations  SNF;Supervision for mobility/OOB     Equipment Recommendations  Rolling walker with 5" wheels;3in1 (PT);Wheelchair (measurements PT);Wheelchair cushion (measurements PT)    Recommendations for Other Services       Precautions / Restrictions Precautions Precautions: Fall;Other (comment) Precaution Comments: Wound vac to R AKA Restrictions Weight Bearing Restrictions: Yes RLE Weight Bearing: Non weight bearing    Mobility  Bed Mobility Overal bed mobility: Modified Independent Bed Mobility: Supine to Sit           General bed mobility comments: remained EOB at end of session with sig other present to assist     Transfers Overall transfer level: Needs assistance Equipment used: Rolling walker (2 wheeled) Transfers: Sit to/from Stand Sit to Stand: Min guard;From elevated surface         General transfer comment: from EOB<>RW x7 total, x5 in a row and encouraged pt to stand from lower bed height but he insisted on elevating surface, but when bed lowered again he was able to perform from lower height; good hand placement, RW stabilized for safety  Ambulation/Gait Ambulation/Gait assistance: Min guard Gait Distance (Feet): 45 Feet Assistive device: Rolling walker (2 wheeled) Gait Pattern/deviations:  (hop-to gait pattern) Gait velocity: Decreased Gait velocity interpretation: <1.8 ft/sec, indicate of risk for recurrent falls General Gait Details: Slow, antalgic gait with RW and min guard for balance; pt with good ability to perform hop-to gait pattern on LLE   Stairs Stairs:  (verbal review, pt deferring to attempt but he was given visual demo also to introduce concept for future sessions)           Wheelchair Mobility    Modified Rankin (Stroke Patients Only)       Balance   Sitting-balance support: No upper extremity supported;Feet supported Sitting balance-Leahy Scale: Good Sitting balance - Comments: static sitting and weight shifting EOB no LOB   Standing balance support: Bilateral upper extremity supported;During functional activity Standing balance-Leahy Scale: Poor Standing balance comment: reliant on UE support of RW                            Cognition Arousal/Alertness: Awake/alert Behavior During Therapy: WFL for tasks assessed/performed;Flat affect Overall Cognitive Status: Within Functional Limits for tasks assessed Area of Impairment: Safety/judgement;Problem solving;Attention;Awareness;Memory  Orientation Level:  (A&O)   Memory: Decreased recall of precautions;Decreased short-term memory Following Commands: Follows one  step commands consistently Safety/Judgement: Decreased awareness of safety   Problem Solving: Requires verbal cues General Comments: participatory but anxious regarding mobility/thinks he will have more difficulty with transfers, etc than he actually does      Exercises Other Exercises Other Exercises: PT encourages performances of chair push-ups to improve tricep endurance and UE strength for hop-to gait Other Exercises: supine RLE AROM: hip flexion, hip abduction, hip extension x10-15 reps ea Other Exercises: standing RLE AROM: hip extension x10 reps Other Exercises: standing LLE AROM: heel raises x10 reps Other Exercises: serial STS x5 reps for strengthening from EOB (various bed heights)    General Comments General comments (skin integrity, edema, etc.): VSS on RA, RR elevated to 34 rpm with exertion, HR 96-106 bpm      Pertinent Vitals/Pain Pain Assessment: Faces Faces Pain Scale: Hurts even more Pain Location: R residual limb, increases during gait Pain Descriptors / Indicators: Sore;Squeezing Pain Intervention(s): Monitored during session;Repositioned;Patient requesting pain meds-RN notified (reviewed phantom pain reduction techniques)    Home Living                      Prior Function            PT Goals (current goals can now be found in the care plan section) Acute Rehab PT Goals Patient Stated Goal: to return home and be independent PT Goal Formulation: With patient Time For Goal Achievement: 11/14/20 Potential to Achieve Goals: Fair Progress towards PT goals: Progressing toward goals    Frequency    Min 3X/week      PT Plan Current plan remains appropriate    Co-evaluation              AM-PAC PT "6 Clicks" Mobility   Outcome Measure  Help needed turning from your back to your side while in a flat bed without using bedrails?: None Help needed moving from lying on your back to sitting on the side of a flat bed without using bedrails?:  None Help needed moving to and from a bed to a chair (including a wheelchair)?: A Little Help needed standing up from a chair using your arms (e.g., wheelchair or bedside chair)?: A Little Help needed to walk in hospital room?: A Little Help needed climbing 3-5 steps with a railing? : A Lot 6 Click Score: 19    End of Session Equipment Utilized During Treatment: Gait belt Activity Tolerance: Patient tolerated treatment well Patient left: in bed;with call bell/phone within reach;with family/visitor present (sig other present) Nurse Communication: Mobility status;Patient requests pain meds PT Visit Diagnosis: Difficulty in walking, not elsewhere classified (R26.2);Other abnormalities of gait and mobility (R26.89)     Time: [id number] PT Time Calculation (min) (ACUTE ONLY): 34 min  Charges:  $Gait Training: 8-22 mins $Therapeutic Exercise: 8-22 mins                     Baylei Siebels P., PTA Acute Rehabilitation Services Pager: [phone] Office: [place] 11/04/2020, 2:14 PM

## 2020-11-04 NOTE — Progress Notes (Signed)
[facility] PROGRESS NOTE  Patrick Anthony [id number] DOB: 1971-05-13 DOA: 09/27/2020 PCP: [name], MD  Status: Remains inpatient appropriate because:Ongoing active pain requiring inpatient pain management, Unsafe d/c plan and IV treatments appropriate due to intensity of illness or inability to take PO   Dispo: The patient is from: Home              Anticipated d/c is to: SNF              Patient currently is not medically stable to d/c. 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  Seen by ID managed with IV antibiotic, CT surgery consult salt was obtained subsequently underwent minimally invasive mitral valve replacement on 10/14/2020 and was managed under CT surgery service and subsequently transferred to [facility] on 3/16.  Patient was followed by ID, orthopedics.  He was found to have swelling on the right leg and seen by orthopedic underwent debridement on the right leg and treated for large complicated abscess with osteomyelitis involving his tibia with 7 by X.3 centimeter area of bone that was excised. Patient was seen by Dr. Sharol Given orthopedic ID and the right above-the-knee amputation has been recommended moving forward to manage this infection. Patient has been told by multiple providers multiple times the need for  the above-the-knee amputation to avoid seeding of his new valve and his graft but has been reluctant and he is aware about dying from staph aureus bacteremia and sepsis. 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 He has had significant anemia in the setting of surgery and wound VAC with bloody drainage  Subjective: Awake and alert.  Complaining of significant pain especially with movement, talking and after laughing at jokes that was told.  States has not had issues with chronic pain and prior to this admission has never really taken pain medications more than Aleve in the past.  Objective: Vitals:   11/04/20 0314 11/04/20 0829  BP: 122/86 112/81  Pulse: 97 100  Resp: 16 20  Temp: 99 F (37.2 C) 99.2 F (37.3 C)  SpO2: 95% 96%    Intake/Output Summary (Last 24 hours) at 11/04/2020 0832 Last data filed at 11/04/2020 [id number] Gross per 24 hour  Intake --  Output 2125 ml  Net -2125 ml   Filed Weights   11/03/20 0320 11/03/20 0745 11/03/20 1125  Weight: 74 kg 73.6 kg 71.3 kg    Exam:  Constitutional: NAD, calm, uncomfortable secondary to right AKA stump pain Respiratory: clear to auscultation bilaterally, no wheezing, no crackles. Normal respiratory effort. No accessory muscle use.  Room air Cardiovascular: Regular rate and rhythm, no murmurs / rubs / gallops. No extremity edema.  Extremities warm to touch Abdomen: no tenderness, no masses palpated. Bowel sounds positive. LBM 3/31 Musculoskeletal: s/p R AKA with  wound VAC in place, exam otherwise unremarkable Neurologic: CN 2-12 grossly intact. Sensation intact, DTR normal. Strength 5/5 x all 4 extremities.  Psychiatric: Normal judgment and insight. Alert and oriented x 3. Normal mood despite ongoing pain in AKA site.    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 Repeat blood cultures from 10/13/20 are negative. -WBCs trending downward from a peak of 21.8 on 3/26 and as of 3/31 [id number]  Large complicated abscess with osteomyelitis involving his right tibia- S/P Rt AKA 3/25 -Followed by ID, vascular, CT surgery and Dr. Sharol Given  -Patient initially was very reluctant to proceed with AKA but given clinical status eventually agreed -Pain adequately controlled with combination of low-dose IV Dilaudid and modest dose oral oxycodone -We will discontinue Neurontin in favor of Lyrica 75 mg at at bedtime -We will start low-dose OxyContin 10 mg every 12 hours with plans to initially wean and discontinue IV Dilaudid then wean and discontinue oral oxycodone as OxyContin dose titrated upward.  Eventual goal is to eventually wean and discontinue OxyContin   Status post right AKA continue pain management and wound care as above, wound VAC in place-with bloody drainage likely contributing to anemia.  Incision (Closed) 10/14/20 Groin Right (Active)  Date First Assessed/Time First Assessed: 10/14/20 1132   Location: Groin  Location Orientation: Right    Assessments 10/16/2020  8:00 PM 11/03/2020  9:00 PM  Dressing Type None None  Site / Wound Assessment Clean;Dry Clean;Dry  Margins Attached edges (approximated) Attached edges (approximated)  Closure -- Skin glue;Approximated  Drainage Amount None None  Treatment Other (Comment) --     No Linked orders to display     Incision (Closed) 10/14/20 Chest Right (Active)  Date First Assessed/Time First Assessed: 10/14/20 1134   Location: Chest  Location Orientation: Right    Assessments 10/14/2020  2:15 PM 11/03/2020  9:00 PM  Dressing Type Silver hydrofiber None  Dressing Clean;Dry;Intact --  Dressing Change  Frequency Other (Comment) --  Site / Wound Assessment Dressing in place / Unable to assess Clean;Dry  Margins -- Attached edges (approximated)  Closure -- Skin glue;Approximated  Drainage Amount -- None  Treatment Other (Comment) --     No Linked orders to display     Incision (Closed) 10/20/20 Leg Right (Active)  Date First Assessed/Time First Assessed: 10/20/20 1656   Location: Leg  Location Orientation: Right    Assessments 10/21/2020  8:30 AM 11/03/2020  7:15 AM  Dressing Type Compression wrap Negative pressure wound therapy  Dressing Clean;Dry;Intact Clean;Dry;Intact  Site / Wound Assessment Dressing in place / Unable to assess --  Drainage Amount -- Minimal  Drainage Description -- Sanguineous     No Linked orders to display     Incision (Closed) 10/29/20 Knee Right (Active)  Date First Assessed/Time First Assessed: 10/29/20 1341   Location: Knee  Location Orientation: Right    Assessments 10/29/2020  3:00 PM 11/03/2020  9:00 PM  Dressing Type Other (Comment) Negative pressure wound therapy  Dressing Clean;Dry;Intact Clean;Dry;Intact  Site / Wound Assessment Dressing in place / Unable to assess Dressing in place / Unable to assess  Drainage Amount None --     No Linked orders to display     Negative Pressure Wound Therapy  Knee Right (Active)  Placement Date/Time: 10/29/20 1439   Wound Type: Surgical (Open wound)  Location: Knee  Location Orientation: Right    Assessments 10/29/2020  3:00 PM 11/04/2020  6:51 AM  Site / Wound Assessment Dressing in place / Unable to assess --  Target Pressure (mmHg) 125 --  Output (mL) 0 mL 125 mL     No Linked orders to display    ESRD and HD MWF:  -Appreciate nephrology assistance  Acute blood loss anemia in the setting of surgery, and wound VAC bloody draiange/Anemia of chronic kidney disease -S/p 3 units PRBC -Continue aranesp and dose was increased.   -No iron due to acute infection.   -Hemoglobin 7.1 as of 3/31-during the early  portion of hospitalization patient's hemoglobin has ranged between 8.9 and 10.0.  He may benefit from additional transfusion..  Hypertension  -Continue metoprolol and clonidine patch as well as regular hemodialysis.  Nutrition Status: Nutrition Problem: Increased nutrient needs Etiology: chronic illness (ESRD on HD) Signs/Symptoms: estimated needs Interventions: Nepro shake,Refer to RD note for recommendations Estimated body mass index is 21.31 kg/m as calculated from the following:   Height as of this encounter: 6' 0.01" (1.829 m).   Weight as of this encounter: 71.3 kg.   Other problems: Hyperkalemia:  -Resolved  Secondary hyperparathyroidism -continue Sensipar binders and Hectorol per nephrology.   History of hepatitis C/liver cirrhosis -outpatient follow-up with ID. -Monitor LFTs intermittently.    Hyponatremia  -Not unexpected in dialysis patient and can be managed with dialysate per nephrology   Nasal bleeding  -3/20 resolved.  Data Reviewed: Basic Metabolic Panel: Recent Labs  Lab 10/31/20 0224 10/31/20 0741 11/01/20 0100 11/02/20 0100 11/03/20 0310 11/04/20 0129  NA 131*  --  128* 132* 128* 131*  K 4.0 4.2 4.1 3.8 3.4* 3.5  CL 91*  --  90* 95* 90* 95*  CO2 28  --  '26 26 24 26  '$ GLUCOSE 97  --  120* 108* 94 124*  BUN 29*  --  39* 17 32* 19  CREATININE 5.37*  --  7.31* 4.63* 6.79* 4.55*  CALCIUM 8.5*  --  8.0* 8.3* 8.5* 7.7*  PHOS 4.8*  --  5.5* 3.3 4.2 3.0   Liver Function Tests: Recent Labs  Lab 10/31/20 0224 11/01/20 0100 11/02/20 0100 11/03/20 0310 11/04/20 0129  ALBUMIN 2.3* 2.1* 2.1* 2.2* 1.9*   No results for input(s): LIPASE, AMYLASE in the last 168 hours. No results for input(s): AMMONIA in the last 168 hours. CBC: Recent Labs  Lab 10/31/20 0224 11/01/20 0100 11/02/20 0100 11/03/20 0310 11/04/20 0129  WBC 15.9* 17.4* 17.3* 18.1* 14.1*  NEUTROABS 11.2* 14.0* 13.4* 13.7* 10.1*  HGB 7.7* 7.0* 7.6* 7.6* 7.1*  HCT 25.3* 21.9*  25.0* 25.5* 23.5*  MCV 90.7 89.8 89.3 89.2 90.4  PLT 360 377 361 410* 390   Cardiac Enzymes: No results for input(s): CKTOTAL, CKMB, CKMBINDEX, TROPONINI in the last 168 hours. BNP (last 3 results) No results for input(s): BNP in the last 8760 hours.  ProBNP (last 3 results) No results for input(s): PROBNP in the last 8760 hours.  CBG: Recent Labs  Lab 11/03/20 1206  GLUCAP 83    No results found for this or any previous visit (from the past 240 hour(s)).   Studies: No results found.  Scheduled Meds: . (feeding supplement) PROSource Plus  30 mL Oral BID BM  . aspirin EC  325 mg Oral Daily  . calcium acetate  667 mg Oral TID  WC  . Chlorhexidine Gluconate Cloth  6 each Topical Q0600  . Chlorhexidine Gluconate Cloth  6 each Topical Q0600  . cinacalcet  60 mg Oral Q breakfast  . cloNIDine  0.3 mg Transdermal Weekly  . [START ON 11/08/2020] darbepoetin (ARANESP) injection - DIALYSIS  100 mcg Intravenous Q Mon-HD  . docusate sodium  100 mg Oral BID  . enoxaparin (LOVENOX) injection  30 mg Subcutaneous Q48H  . feeding supplement (NEPRO CARB STEADY)  237 mL Oral TID BM  . influenza vac split quadrivalent PF  0.5 mL Intramuscular Tomorrow-1000  . metoprolol tartrate  50 mg Oral BID  . multivitamin  1 tablet Oral QHS  . oxymetazoline  1 spray Each Nare BID  . pantoprazole  40 mg Oral Daily  . pregabalin  75 mg Oral QHS  . senna-docusate  1 tablet Oral BID  . sodium chloride flush  3 mL Intravenous Q12H   Continuous Infusions: . sodium chloride    . sodium chloride 10 mL/hr at 10/29/20 1351  . sodium chloride    . methocarbamol (ROBAXIN) IV    . vancomycin Stopped (11/03/20 1245)    Principal Problem:   Bacterial endocarditis Active Problems:   Chronic combined systolic and diastolic congestive heart failure (HCC)   HTN (hypertension)   Anemia in chronic kidney disease   Hepatitis C   ESRD on dialysis (HCC)   End stage renal disease (HCC)   Chest pain   Sepsis  ([place])   Pneumonia due to infectious agent   ADPKD (autosomal dominant polycystic kidney disease)   Sepsis due to pneumonia (HCC)   Lactic acidosis   Cerebral embolism with cerebral infarction   Bacteremia due to Staphylococcus aureus   Mitral regurgitation   Uncontrolled hypertension   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   Patent foramen ovale   Dental caries   Chronic apical periodontitis   Chronic periodontitis   Accretions on teeth   S/P minimally-invasive mitral valve replacement with bioprosthetic valve   S/P mitral valve repair   Abscess   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)   Abscess of right leg   S/P mitral valve replacement   Chronic osteomyelitis of left tibia with draining sinus [facility])   Consultants:  Neurology  Infectious disease  Nephrology  Cardiothoracic surgery  Orthopedic surgery  CIR physician  Palliative medicine  Procedures:  Echocardiogram with bubble study  Continuous EEG  TEE  Intraoperative TEE  Follow-up echocardiogram  Left heart catheterization  Multiple dental extractions with alveoloplasty  Minimally invasive mitral valve replacement using Medtronic Mosaic  Irrigation and debridement of leg  Right above-the-knee amputation  Antibiotics: Anti-infectives (From admission, onward)   Start     Dose/Rate Route Frequency Ordered Stop   10/30/20 1100  vancomycin (VANCOCIN) IVPB 750 mg/150 ml premix        750 mg 150 mL/hr over 60 Minutes Intravenous Once in dialysis 10/30/20 0833 10/30/20 1317   10/29/20 1000  ceFAZolin (ANCEF) IVPB 2g/100 mL premix  Status:  Discontinued        2 g 200 mL/hr over 30 Minutes Intravenous To Short Stay 10/29/20 0730 10/29/20 1634   10/27/20 2015  vancomycin (VANCOCIN) IVPB 750 mg/150 ml premix        750 mg 150 mL/hr over 60 Minutes Intravenous Every M-W-F (Hemodialysis) 10/27/20 2005     10/25/20 0922  vancomycin (VANCOCIN) 500-5 MG/100ML-% IVPB       Note to  Pharmacy: Gilford Rile,  Nina   : cabinet override      10/25/20 0922 10/25/20 0958   10/21/20 0600  ceFAZolin (ANCEF) IVPB 2g/100 mL premix        2 g 200 mL/hr over 30 Minutes Intravenous On call to O.R. 10/20/20 1349 10/21/20 0650   10/20/20 2243  vancomycin (VANCOCIN) 500-5 MG/100ML-% IVPB       Note to Pharmacy: Wallace Cullens   : cabinet override      10/20/20 2243 10/20/20 2243   10/20/20 1437  ceFAZolin (ANCEF) 2-4 GM/100ML-% IVPB       Note to Pharmacy: Ladoris Gene   : cabinet override      10/20/20 1437 10/20/20 1612   10/20/20 1200  vancomycin (VANCOREADY) IVPB 500 mg/100 mL  Status:  Discontinued        500 mg 100 mL/hr over 60 Minutes Intravenous Every M-W-F (Hemodialysis) 10/20/20 0903 10/27/20 2005   10/18/20 1124  vancomycin variable dose per unstable renal function (pharmacist dosing)  Status:  Discontinued         Does not apply See admin instructions 10/18/20 1124 10/20/20 0903   10/16/20 1600  vancomycin (VANCOREADY) IVPB 500 mg/100 mL  Status:  Discontinued        500 mg 100 mL/hr over 60 Minutes Intravenous Every Sat (Hemodialysis) 10/16/20 1306 10/16/20 1312   10/15/20 1500  vancomycin (VANCOCIN) IVPB 500 mg/100 ml premix  Status:  Discontinued        500 mg 100 mL/hr over 60 Minutes Intravenous Every M-W-F (Hemodialysis) 10/15/20 1357 10/17/20 0942   10/14/20 2130  vancomycin (VANCOCIN) IVPB 1000 mg/200 mL premix  Status:  Discontinued        1,000 mg 200 mL/hr over 60 Minutes Intravenous  Once 10/14/20 1403 10/14/20 1547   10/14/20 2130  vancomycin (VANCOREADY) IVPB 500 mg/100 mL        500 mg 100 mL/hr over 60 Minutes Intravenous  Once 10/14/20 1547 10/14/20 2222   10/14/20 1730  cefUROXime (ZINACEF) 1.5 g in sodium chloride 0.9 % 100 mL IVPB        1.5 g 200 mL/hr over 30 Minutes Intravenous Every 24 hr x 2 10/14/20 1403 10/15/20 1718   10/14/20 1128  vancomycin (VANCOCIN) 1,000 mg in sodium chloride 0.9 % 1,000 mL irrigation  Status:  Discontinued          As  needed 10/14/20 1128 10/14/20 1401   10/14/20 0400  vancomycin (VANCOREADY) IVPB 1250 mg/250 mL        1,250 mg 166.7 mL/hr over 90 Minutes Intravenous To Surgery 10/13/20 1039 10/14/20 0910   10/14/20 0400  cefUROXime (ZINACEF) 1.5 g in sodium chloride 0.9 % 100 mL IVPB        1.5 g 200 mL/hr over 30 Minutes Intravenous To Surgery 10/13/20 1039 10/14/20 0835   10/14/20 0400  cefUROXime (ZINACEF) 750 mg in sodium chloride 0.9 % 100 mL IVPB        750 mg 200 mL/hr over 30 Minutes Intravenous To Surgery 10/13/20 1039 10/14/20 1250   10/14/20 0400  vancomycin (VANCOCIN) 1,000 mg in sodium chloride 0.9 % 1,000 mL irrigation  Status:  Discontinued         Irrigation To Surgery 10/13/20 1039 10/14/20 1403   10/13/20 1657  vancomycin (VANCOCIN) 500-5 MG/100ML-% IVPB       Note to Pharmacy: Judieth Keens  : cabinet override      10/13/20 1657 10/13/20 1717   10/12/20 0600  ceFAZolin (ANCEF) IVPB  2g/100 mL premix        2 g 200 mL/hr over 30 Minutes Intravenous On call to O.R. 10/11/20 1841 10/12/20 1315   10/11/20 1415  vancomycin (VANCOCIN) IVPB 500 mg/100 ml premix  Status:  Discontinued        500 mg 100 mL/hr over 60 Minutes Intravenous Every M-W-F (Hemodialysis) 10/11/20 1413 10/14/20 1403   10/11/20 1413  vancomycin (VANCOCIN) 500-5 MG/100ML-% IVPB       Note to Pharmacy: Kalman Shan   : cabinet override      10/11/20 1413 10/12/20 0229   10/11/20 1353  Vancomycin (VANCOCIN) 750-5 MG/150ML-% IVPB  Status:  Discontinued       Note to Pharmacy: Kalman Shan   : cabinet override      10/11/20 1353 10/11/20 1417   10/08/20 1200  vancomycin (VANCOCIN) IVPB 750 mg/150 ml premix  Status:  Discontinued        750 mg 150 mL/hr over 60 Minutes Intravenous Every M-W-F (Hemodialysis) 10/05/20 1404 10/11/20 1413   10/06/20 1619  vancomycin (VANCOCIN) 500-5 MG/100ML-% IVPB       Note to Pharmacy: Judieth Keens  : cabinet override      10/06/20 1619 10/06/20 1736   10/06/20 1200   vancomycin (VANCOCIN) IVPB 750 mg/150 ml premix  Status:  Discontinued        750 mg 150 mL/hr over 60 Minutes Intravenous Every M-W-F (Hemodialysis) 10/04/20 0905 10/05/20 1404   10/06/20 1200  vancomycin (VANCOCIN) IVPB 500 mg/100 ml premix        500 mg 100 mL/hr over 60 Minutes Intravenous Every Wed (Hemodialysis) 10/05/20 1404 10/06/20 2000   10/01/20 2000  DAPTOmycin (CUBICIN) 840 mg in sodium chloride 0.9 % IVPB  Status:  Discontinued        840 mg 233.6 mL/hr over 30 Minutes Intravenous Every 48 hours 10/01/20 0922 10/01/20 1056   10/01/20 1200  vancomycin (VANCOCIN) IVPB 1000 mg/200 mL premix  Status:  Discontinued        1,000 mg 200 mL/hr over 60 Minutes Intravenous Every M-W-F (Hemodialysis) 10/01/20 1056 10/04/20 0845   09/29/20 1200  vancomycin (VANCOCIN) IVPB 1000 mg/200 mL premix  Status:  Discontinued        1,000 mg 200 mL/hr over 60 Minutes Intravenous Every M-W-F (Hemodialysis) 09/27/20 1443 10/01/20 0922   09/29/20 1200  ceFEPIme (MAXIPIME) 2 g in sodium chloride 0.9 % 100 mL IVPB  Status:  Discontinued        2 g 200 mL/hr over 30 Minutes Intravenous Every M-W-F (Hemodialysis) 09/27/20 1443 09/28/20 1054   09/28/20 0900  metroNIDAZOLE (FLAGYL) IVPB 500 mg  Status:  Discontinued        500 mg 100 mL/hr over 60 Minutes Intravenous Every 12 hours 09/27/20 1808 09/28/20 1054   09/28/20 0900  ceFEPIme (MAXIPIME) 1 g in sodium chloride 0.9 % 100 mL IVPB  Status:  Discontinued        1 g 200 mL/hr over 30 Minutes Intravenous Every 12 hours 09/27/20 1808 09/27/20 1812   09/27/20 1445  vancomycin (VANCOREADY) IVPB 2000 mg/400 mL        2,000 mg 200 mL/hr over 120 Minutes Intravenous  Once 09/27/20 1433 09/27/20 2330   09/27/20 1430  ceFEPIme (MAXIPIME) 2 g in sodium chloride 0.9 % 100 mL IVPB        2 g 200 mL/hr over 30 Minutes Intravenous  Once 09/27/20 1418 09/27/20 1541   09/27/20 1430  metroNIDAZOLE (FLAGYL) IVPB  500 mg        500 mg 100 mL/hr over 60 Minutes  Intravenous  Once 09/27/20 1418 09/27/20 2214   09/27/20 1430  vancomycin (VANCOREADY) IVPB 1750 mg/350 mL  Status:  Discontinued        1,750 mg 175 mL/hr over 120 Minutes Intravenous  Once 09/27/20 1418 09/27/20 1433        Time spent: 35 minutes    Erin Hearing ANP  [facility] 7 am - 330 pm/M-F for direct patient care and secure chat Please refer to Amion for contact info 38  days

## 2020-11-04 NOTE — Progress Notes (Signed)
Occupational Therapy Treatment Patient Details Name: Patrick Anthony MRN: [id number] DOB: Nov 13, 1970 Today's Date: 11/04/2020    History of present illness Pt is a 50 y.o. male admitted 09/27/20 with fever, chest pain, SOB, generalized weakness. Found to have MRSA bacteremia. 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 ECHO with mitral valve vegetation. No significant CAD on LHC. S/p minimally invasive MVR with closure of PFO on 3/10. 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 PMH includes CKD (HD MWF), HTN, CHF, Hep C, polysubstance abuse.   OT comments  Pt making progress with functional goals, despiet 8/10 c/o R LE pain. Session focused on ADLs seated EOB, standing at RW for grooming and SPTs to [facility]. OT will continue to follow acutely to maximize level of function and safety  Follow Up Recommendations  SNF;Other (comment) (CIR declined)    Equipment Recommendations  Wheelchair (measurements OT);Wheelchair cushion (measurements OT);Other (comment) (RW, reacher)    Recommendations for Other Services      Precautions / Restrictions Precautions Precautions: Fall;Other (comment) Precaution Comments: Wound vac to R AKA Restrictions Weight Bearing Restrictions: Yes RLE Weight Bearing: Non weight bearing       Mobility Bed Mobility Overal bed mobility: Modified Independent Bed Mobility: Supine to Sit Rolling: Modified independent (Device/Increase time)         General bed mobility comments: increased time and effort to sit EOB, no physical assist required    Transfers Overall transfer level: Needs assistance Equipment used: Rolling walker (2 wheeled) Transfers: Sit to/from Stand Sit to Stand: Min guard;From elevated surface Stand pivot transfers: Min guard       General transfer comment: SPT to BSC and back to sitting EOB    Balance Overall balance assessment:  Needs assistance Sitting-balance support: No upper extremity supported;Feet supported Sitting balance-Leahy Scale: Good Sitting balance - Comments: static sitting and weight shifting EOB no LOB   Standing balance support: Bilateral upper extremity supported;During functional activity Standing balance-Leahy Scale: Poor Standing balance comment: reliant on UE support of RW                           ADL either performed or assessed with clinical judgement   ADL Overall ADL's : Needs assistance/impaired     Grooming: Wash/dry hands;Wash/dry face;Min guard;Standing       Lower Body Bathing: Min guard;Supervison/ safety;Sitting/lateral leans;Sit to/from stand Lower Body Bathing Details (indicate cue type and reason): simulated seated EOB     Lower Body Dressing: Min guard;Minimal assistance;Sitting/lateral leans   Toilet Transfer: Min Dietitian Details (indicate cue type and reason): assist with wound vac management Toileting- Clothing Manipulation and Hygiene: Min guard;Sit to/from stand       Functional mobility during ADLs: Min guard;Rolling walker;Cueing for safety       Vision Patient Visual Report: No change from baseline     Perception     Praxis      Cognition Arousal/Alertness: Awake/alert Behavior During Therapy: WFL for tasks assessed/performed;Flat affect Overall Cognitive Status: Within Functional Limits for tasks assessed Area of Impairment: Safety/judgement;Problem solving;Attention;Awareness;Memory                 Orientation Level:  (A&O)   Memory: Decreased recall of precautions;Decreased short-term memory Following Commands: Follows one step commands consistently Safety/Judgement: Decreased awareness of safety   Problem Solving: Requires verbal cues General Comments: participatory but anxious regarding mobility/thinks  he will have more difficulty with transfers, etc than he actually does         Exercises Exercises: Other exercises Other Exercises Other Exercises: PT encourages performances of chair push-ups to improve tricep endurance and UE strength for hop-to gait Other Exercises: supine RLE AROM: hip flexion, hip abduction, hip extension x10-15 reps ea Other Exercises: standing RLE AROM: hip extension x10 reps Other Exercises: standing LLE AROM: heel raises x10 reps Other Exercises: serial STS x5 reps for strengthening from EOB (various bed heights)   Shoulder Instructions       General Comments VSS on RA, RR elevated to 34 rpm with exertion, HR 96-106 bpm    Pertinent Vitals/ Pain       Pain Assessment: Faces Faces Pain Scale: Hurts whole lot Pain Location: R residual limb, increases during mobility Pain Descriptors / Indicators: Sore;Squeezing;Tightness;Grimacing;Guarding Pain Intervention(s): Monitored during session;Limited activity within patient's tolerance;Repositioned  Home Living                                          Prior Functioning/Environment              Frequency  Min 2X/week        Progress Toward Goals  OT Goals(current goals can now be found in the care plan section)  Progress towards OT goals: Progressing toward goals  Acute Rehab OT Goals Patient Stated Goal: to return home and be independent  Plan Discharge plan remains appropriate    Co-evaluation                 AM-PAC OT "6 Clicks" Daily Activity     Outcome Measure   Help from another person eating meals?: None Help from another person taking care of personal grooming?: A Little Help from another person toileting, which includes using toliet, bedpan, or urinal?: A Little Help from another person bathing (including washing, rinsing, drying)?: A Little Help from another person to put on and taking off regular upper body clothing?: None Help from another person to put on and taking off regular lower body clothing?: A Little 6 Click Score: 20     End of Session Equipment Utilized During Treatment: Rolling walker  OT Visit Diagnosis: Unsteadiness on feet (R26.81);Muscle weakness (generalized) (M62.81);Pain Pain - Right/Left: Right Pain - part of body: Leg   Activity Tolerance Patient limited by pain   Patient Left with call bell/phone within reach;in bed (sitting EOB)   Nurse Communication          Time: 1015-1040 OT Time Calculation (min): 25 min  Charges: OT General Charges $OT Visit: 1 Visit OT Treatments $Self Care/Home Management : 8-22 mins $Therapeutic Activity: 8-22 mins     Britt Bottom 11/04/2020, 2:26 PM

## 2020-11-04 NOTE — Progress Notes (Signed)
[facility] ROUNDING NOTE   Subjective:   Brief history: 50 year old gentleman with history end-stage renal disease Monday Wednesday Friday dialysis, history of chronic diastolic heart failure with ejection fraction of 55-60%.  History of medical noncompliance, history of liver cirrhosis with hepatitis C, history of alcohol use.  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  Underwent successful minimally invasive mitral valve replacement 10/14/2020.  Was found to have swelling in right leg was evaluated orthopedic with debridement complicated by abscess and osteomyelitis involving tibia.  This area was excised by orthopedic surgery.  Dr. Sharol Given.  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  His last dialysis treatment was 11/03/2020 with 2 L removed.   Blood pressure 122/86 pulse 104 temperature 99 O2 sats 95% room air  Sodium 131 potassium 3.5 chloride 95 CO2 26 BUN 19 creatinine 4.55 glucose 124 calcium 7.7 phosphorus 3 albumin 1.9 hemoglobin 7.1      Objective:  Vital signs in last 24 hours:  Temp:  [97.7 F (36.5 C)-99.1 F (37.3 C)] 99 F (37.2 C) (03/31 0314) Pulse Rate:  [85-99] 97 (03/31 0314) Resp:  [13-20] 16 (03/31 0314) BP: (103-146)/(73-114) 122/86 (03/31 0314) SpO2:  [93 %-96 %] 95 % (03/31 0314) Weight:  [71.3 kg] 71.3 kg (03/30 1125)  Weight change: -0.427 kg Filed Weights   11/03/20 0320 11/03/20 0745 11/03/20 1125  Weight: 74 kg 73.6 kg 71.3 kg    Intake/Output: I/O last 3 completed shifts: In: 480 [P.O.:480] Out: 2125 [Drains:125; Other:2000]   Intake/Output this shift:  No intake/output data recorded.  General: Alert dialysis 80 Heart: RRR, soft 1/6 SEM, no rub or gallop Lungs: CTA nonlabored breathing Abdomen: BS positive, soft NTND Extremities: No pedal edema , R AKA wound VAC present  HD access =patent on HD LUA AVG    Basic Metabolic Panel: Recent Labs  Lab 10/31/20 0224  10/31/20 0741 11/01/20 0100 11/02/20 0100 11/03/20 0310 11/04/20 0129  NA 131*  --  128* 132* 128* 131*  K 4.0 4.2 4.1 3.8 3.4* 3.5  CL 91*  --  90* 95* 90* 95*  CO2 28  --  '26 26 24 26  '$ GLUCOSE 97  --  120* 108* 94 124*  BUN 29*  --  39* 17 32* 19  CREATININE 5.37*  --  7.31* 4.63* 6.79* 4.55*  CALCIUM 8.5*  --  8.0* 8.3* 8.5* 7.7*  PHOS 4.8*  --  5.5* 3.3 4.2 3.0    Liver Function Tests: Recent Labs  Lab 10/31/20 0224 11/01/20 0100 11/02/20 0100 11/03/20 0310 11/04/20 0129  ALBUMIN 2.3* 2.1* 2.1* 2.2* 1.9*   No results for input(s): LIPASE, AMYLASE in the last 168 hours. No results for input(s): AMMONIA in the last 168 hours.  CBC: Recent Labs  Lab 10/31/20 0224 11/01/20 0100 11/02/20 0100 11/03/20 0310 11/04/20 0129  WBC 15.9* 17.4* 17.3* 18.1* 14.1*  NEUTROABS 11.2* 14.0* 13.4* 13.7* 10.1*  HGB 7.7* 7.0* 7.6* 7.6* 7.1*  HCT 25.3* 21.9* 25.0* 25.5* 23.5*  MCV 90.7 89.8 89.3 89.2 90.4  PLT 360 377 361 410* 390    Cardiac Enzymes: No results for input(s): CKTOTAL, CKMB, CKMBINDEX, TROPONINI in the last 168 hours.  BNP: Invalid input(s): POCBNP  CBG: Recent Labs  Lab 11/03/20 1206  GLUCAP 83    Microbiology: Results for orders placed or performed during the hospital encounter of 09/27/20  Blood Culture (routine x 2)     Status: Abnormal   Collection Time: 09/27/20  2:16 PM   Specimen: BLOOD RIGHT HAND  Result Value Ref Range Status   Specimen Description BLOOD RIGHT HAND  Final   Special Requests   Final    BOTTLES DRAWN AEROBIC AND ANAEROBIC Blood Culture results may not be optimal due to an inadequate volume of blood received in culture bottles Performed at [facility] [address] [place], [place] [address]    Culture  Setup Time   Final    GRAM POSITIVE COCCI IN CLUSTERS IN BOTH AEROBIC AND ANAEROBIC BOTTLES    Culture METHICILLIN RESISTANT STAPHYLOCOCCUS AUREUS (A)  Final   Report Status 09/30/2020 FINAL  Final   Organism ID,  Bacteria METHICILLIN RESISTANT STAPHYLOCOCCUS AUREUS  Final      Susceptibility   Methicillin resistant staphylococcus aureus - MIC*    CIPROFLOXACIN >=8 RESISTANT Resistant     ERYTHROMYCIN >=8 RESISTANT Resistant     GENTAMICIN <=0.5 SENSITIVE Sensitive     OXACILLIN >=4 RESISTANT Resistant     TETRACYCLINE <=1 SENSITIVE Sensitive     VANCOMYCIN <=0.5 SENSITIVE Sensitive     TRIMETH/SULFA <=10 SENSITIVE Sensitive     CLINDAMYCIN <=0.25 SENSITIVE Sensitive     RIFAMPIN <=0.5 SENSITIVE Sensitive     Inducible Clindamycin NEGATIVE Sensitive     * METHICILLIN RESISTANT STAPHYLOCOCCUS AUREUS  Resp Panel by RT-PCR (Flu A&B, Covid) Nasopharyngeal Swab     Status: None   Collection Time: 09/27/20  2:16 PM   Specimen: Nasopharyngeal Swab; Nasopharyngeal(NP) swabs in vial transport medium  Result Value Ref Range Status   SARS Coronavirus 2 by RT PCR NEGATIVE NEGATIVE Final    Comment: (NOTE) SARS-CoV-2 target nucleic acids are NOT DETECTED.  The SARS-CoV-2 RNA is generally detectable in upper respiratory specimens during the acute phase of infection. The lowest concentration of SARS-CoV-2 viral copies this assay can detect is 138 copies/mL. A negative result does not preclude SARS-Cov-2 infection and should not be used as the sole basis for treatment or other patient management decisions. A negative result may occur with  improper specimen collection/handling, submission of specimen other than nasopharyngeal swab, presence of viral mutation(s) within the areas targeted by this assay, and inadequate number of viral copies(<138 copies/mL). A negative result must be combined with clinical observations, patient history, and epidemiological information. The expected result is Negative.  Fact Sheet for Patients:  EntrepreneurPulse.com.au  Fact Sheet for Healthcare Providers:  IncredibleEmployment.be  This test is no t yet approved or cleared by the [place] FDA and  has been authorized for detection and/or diagnosis of SARS-CoV-2 by FDA under an Emergency Use Authorization (EUA). This EUA will remain  in effect (meaning this test can be used) for the duration of the COVID-19 declaration under Section 564(b)(1) of the Act, 21 U.S.C.section 360bbb-3(b)(1), unless the authorization is terminated  or revoked sooner.       Influenza A by PCR NEGATIVE NEGATIVE Final   Influenza B by PCR NEGATIVE NEGATIVE Final    Comment: (NOTE) The Xpert Xpress SARS-CoV-2/FLU/RSV plus assay is intended as an aid in the diagnosis of influenza from Nasopharyngeal swab specimens and should not be used as a sole basis for treatment. Nasal washings and aspirates are unacceptable for Xpert Xpress SARS-CoV-2/FLU/RSV testing.  Fact Sheet for Patients: EntrepreneurPulse.com.au  Fact Sheet for Healthcare Providers: IncredibleEmployment.be  This test is not yet approved or cleared by the [place] FDA and has been authorized for detection and/or diagnosis of SARS-CoV-2 by FDA under an Emergency Use Authorization (  EUA). This EUA will remain in effect (meaning this test can be used) for the duration of the COVID-19 declaration under Section 564(b)(1) of the Act, 21 U.S.C. section 360bbb-3(b)(1), unless the authorization is terminated or revoked.  Performed at [facility], [place] [address] [place], [place] [address]   Blood Culture ID Panel (Reflexed)     Status: Abnormal   Collection Time: 09/27/20  2:16 PM  Result Value Ref Range Status   Enterococcus faecalis NOT DETECTED NOT DETECTED Final   Enterococcus Faecium NOT DETECTED NOT DETECTED Final   Listeria monocytogenes NOT DETECTED NOT DETECTED Final   Staphylococcus species DETECTED (A) NOT DETECTED Final    Comment: CRITICAL RESULT CALLED TO, READ BACK BY AND VERIFIED WITH: C,PIERCE PHARMD '@1009'$  09/28/20 EB    Staphylococcus aureus (BCID) DETECTED (A)  NOT DETECTED Final    Comment: Methicillin (oxacillin)-resistant Staphylococcus aureus (MRSA). MRSA is predictably resistant to beta-lactam antibiotics (except ceftaroline). Preferred therapy is vancomycin unless clinically contraindicated. Patient requires contact precautions if  hospitalized. CRITICAL RESULT CALLED TO, READ BACK BY AND VERIFIED WITH: C,PIERCE PHARMD '@1009'$  09/28/20 EB    Staphylococcus epidermidis NOT DETECTED NOT DETECTED Final   Staphylococcus lugdunensis NOT DETECTED NOT DETECTED Final   Streptococcus species NOT DETECTED NOT DETECTED Final   Streptococcus agalactiae NOT DETECTED NOT DETECTED Final   Streptococcus pneumoniae NOT DETECTED NOT DETECTED Final   Streptococcus pyogenes NOT DETECTED NOT DETECTED Final   A.calcoaceticus-baumannii NOT DETECTED NOT DETECTED Final   Bacteroides fragilis NOT DETECTED NOT DETECTED Final   Enterobacterales NOT DETECTED NOT DETECTED Final   Enterobacter cloacae complex NOT DETECTED NOT DETECTED Final   Escherichia coli NOT DETECTED NOT DETECTED Final   Klebsiella aerogenes NOT DETECTED NOT DETECTED Final   Klebsiella oxytoca NOT DETECTED NOT DETECTED Final   Klebsiella pneumoniae NOT DETECTED NOT DETECTED Final   Proteus species NOT DETECTED NOT DETECTED Final   Salmonella species NOT DETECTED NOT DETECTED Final   Serratia marcescens NOT DETECTED NOT DETECTED Final   Haemophilus influenzae NOT DETECTED NOT DETECTED Final   Neisseria meningitidis NOT DETECTED NOT DETECTED Final   Pseudomonas aeruginosa NOT DETECTED NOT DETECTED Final   Stenotrophomonas maltophilia NOT DETECTED NOT DETECTED Final   Candida albicans NOT DETECTED NOT DETECTED Final   Candida auris NOT DETECTED NOT DETECTED Final   Candida glabrata NOT DETECTED NOT DETECTED Final   Candida krusei NOT DETECTED NOT DETECTED Final   Candida parapsilosis NOT DETECTED NOT DETECTED Final   Candida tropicalis NOT DETECTED NOT DETECTED Final   Cryptococcus neoformans/gattii  NOT DETECTED NOT DETECTED Final   Meth resistant mecA/C and MREJ DETECTED (A) NOT DETECTED Final    Comment: CRITICAL RESULT CALLED TO, READ BACK BY AND VERIFIED WITH: C,PIERCE PHARMD '@1009'$  09/28/20 EB Performed at [facility], [address] [place],  [address]   Blood Culture (routine x 2)     Status: Abnormal   Collection Time: 09/27/20  2:29 PM   Specimen: BLOOD  Result Value Ref Range Status   Specimen Description BLOOD SITE NOT SPECIFIED  Final   Special Requests   Final    BOTTLES DRAWN AEROBIC AND ANAEROBIC Blood Culture results may not be optimal due to an inadequate volume of blood received in culture bottles   Culture  Setup Time   Final    GRAM POSITIVE COCCI IN CLUSTERS ANAEROBIC BOTTLE ONLY CRITICAL VALUE NOTED.  VALUE IS CONSISTENT WITH PREVIOUSLY REPORTED AND CALLED VALUE.    Culture (  A)  Final    STAPHYLOCOCCUS AUREUS SUSCEPTIBILITIES PERFORMED ON PREVIOUS CULTURE WITHIN THE LAST 5 DAYS. Performed at [facility], [place] [address] [place], [place] [address]    Report Status 10/01/2020 FINAL  Final  Culture, blood (routine x 2)     Status: None   Collection Time: 09/30/20  6:04 AM   Specimen: BLOOD RIGHT FOREARM  Result Value Ref Range Status   Specimen Description BLOOD RIGHT FOREARM  Final   Special Requests   Final    BOTTLES DRAWN AEROBIC AND ANAEROBIC Blood Culture adequate volume   Culture   Final    NO GROWTH 5 DAYS Performed at [facility], [place] [address] [place], [place] [address]    Report Status 10/05/2020 FINAL  Final  Culture, blood (routine x 2)     Status: Abnormal   Collection Time: 09/30/20  6:08 AM   Specimen: BLOOD RIGHT HAND  Result Value Ref Range Status   Specimen Description BLOOD RIGHT HAND  Final   Special Requests   Final    BOTTLES DRAWN AEROBIC AND ANAEROBIC Blood Culture results may not be optimal due to an inadequate volume of blood received in culture bottles   Culture  Setup Time   Final    GRAM  POSITIVE COCCI IN CLUSTERS ANAEROBIC BOTTLE ONLY CRITICAL VALUE NOTED.  VALUE IS CONSISTENT WITH PREVIOUSLY REPORTED AND CALLED VALUE.    Culture (A)  Final    STAPHYLOCOCCUS AUREUS SUSCEPTIBILITIES PERFORMED ON PREVIOUS CULTURE WITHIN THE LAST 5 DAYS. Performed at [facility], [place] [address], [place], [place] [address]    Report Status 10/03/2020 FINAL  Final  Culture, blood (routine x 2)     Status: None   Collection Time: 10/03/20  2:42 AM   Specimen: BLOOD  Result Value Ref Range Status   Specimen Description BLOOD LEFT ARM  Final   Special Requests   Final    BOTTLES DRAWN AEROBIC AND ANAEROBIC Blood Culture results may not be optimal due to an inadequate volume of blood received in culture bottles   Culture   Final    NO GROWTH 5 DAYS Performed at [facility], [place] [address] [place], [place] [address]    Report Status 10/08/2020 FINAL  Final  Culture, blood (routine x 2)     Status: None   Collection Time: 10/03/20  2:42 AM   Specimen: BLOOD  Result Value Ref Range Status   Specimen Description BLOOD LEFT HAND  Final   Special Requests   Final    BOTTLES DRAWN AEROBIC ONLY Blood Culture results may not be optimal due to an inadequate volume of blood received in culture bottles   Culture   Final    NO GROWTH 5 DAYS Performed at [facility], [place] [address] [place], [place] [address]    Report Status 10/08/2020 FINAL  Final  Surgical pcr screen     Status: Abnormal   Collection Time: 10/11/20  6:44 PM   Specimen: Nasal Mucosa; Nasal Swab  Result Value Ref Range Status   MRSA, PCR POSITIVE (A) NEGATIVE Final    Comment: RESULT CALLED TO, READ BACK BY AND VERIFIED WITH: F COMBRES RN 10/11/20 2339 JDW    Staphylococcus aureus POSITIVE (A) NEGATIVE Final    Comment: (NOTE) The Xpert SA Assay (FDA approved for NASAL specimens in patients 39 years of age and older), is one component of a comprehensive surveillance program. It is not  intended to  diagnose infection nor to guide or monitor treatment. Performed at [facility], [place] [address] [place], [place] [address]   Culture, blood (routine x 2)     Status: None   Collection Time: 10/13/20 10:43 PM   Specimen: BLOOD  Result Value Ref Range Status   Specimen Description BLOOD RIGHT THUMB  Final   Special Requests   Final    BOTTLES DRAWN AEROBIC AND ANAEROBIC Blood Culture adequate volume   Culture   Final    NO GROWTH 5 DAYS Performed at [facility], [place] [address], [place], [place] [address]    Report Status 10/19/2020 FINAL  Final  Culture, blood (routine x 2)     Status: None   Collection Time: 10/13/20 10:43 PM   Specimen: BLOOD RIGHT HAND  Result Value Ref Range Status   Specimen Description BLOOD RIGHT HAND  Final   Special Requests   Final    BOTTLES DRAWN AEROBIC AND ANAEROBIC Blood Culture adequate volume   Culture   Final    NO GROWTH 5 DAYS Performed at [facility], [place] [address], [place], [place] [address]    Report Status 10/19/2020 FINAL  Final  Aerobic/Anaerobic Culture w Gram Stain (surgical/deep wound)     Status: None   Collection Time: 10/14/20 10:38 AM   Specimen: Soft Tissue, Other  Result Value Ref Range Status   Specimen Description TISSUE MITRAL VALVE VEGETATION SPEC A  Final   Special Requests MITRAL VALVE VEGETATION  Final   Gram Stain   Final    WBC PRESENT,BOTH PMN AND MONONUCLEAR NO ORGANISMS SEEN    Culture   Final    No growth aerobically or anaerobically. Performed at [facility], [place] [address] [place], [place] [address]    Report Status 10/19/2020 FINAL  Final  Aerobic/Anaerobic Culture w Gram Stain (surgical/deep wound)     Status: None   Collection Time: 10/20/20  4:12 PM   Specimen: Soft Tissue, Other  Result Value Ref Range Status   Specimen Description TISSUE  Final   Special Requests RIGHT LEG ABSC SPEC A  Final   Gram Stain   Final    RARE WBC PRESENT,BOTH PMN AND MONONUCLEAR NO  ORGANISMS SEEN    Culture   Final    FEW METHICILLIN RESISTANT STAPHYLOCOCCUS AUREUS CRITICAL RESULT CALLED TO, READ BACK BY AND VERIFIED WITH: RN N.CROSFON AT [id number] ON 10/22/2020 BY T.SAAD NO ANAEROBES ISOLATED Performed at [facility], [place] [address], [place], [place] [address]    Report Status 10/26/2020 FINAL  Final   Organism ID, Bacteria METHICILLIN RESISTANT STAPHYLOCOCCUS AUREUS  Final      Susceptibility   Methicillin resistant staphylococcus aureus - MIC*    CIPROFLOXACIN >=8 RESISTANT Resistant     ERYTHROMYCIN >=8 RESISTANT Resistant     GENTAMICIN <=0.5 SENSITIVE Sensitive     OXACILLIN >=4 RESISTANT Resistant     TETRACYCLINE <=1 SENSITIVE Sensitive     VANCOMYCIN 1 SENSITIVE Sensitive     TRIMETH/SULFA <=10 SENSITIVE Sensitive     CLINDAMYCIN <=0.25 SENSITIVE Sensitive     RIFAMPIN <=0.5 SENSITIVE Sensitive     Inducible Clindamycin NEGATIVE Sensitive     * FEW METHICILLIN RESISTANT STAPHYLOCOCCUS AUREUS  Fungus Stain     Status: None   Collection Time: 10/20/20  4:12 PM   Specimen: Soft Tissue, Other  Result Value Ref Range Status   FUNGUS STAIN Final report  Final    Comment: (NOTE) Performed At: [facility] [place], [place] [id number] Rush Farmer MD [id number]    Fungal Source TISSUE  Final    Comment: RIGHT LEG ABSC SPEC A Performed at [facility], [place] [address], [place], [place] [address]   Fungal Stain reflex     Status: None   Collection Time: 10/20/20  4:12 PM  Result Value Ref Range Status   Fungal stain result 1 Comment  Final    Comment: (NOTE) KOH/Calcofluor preparation:  no fungus observed. Performed At: [facility] [place], [place] [id number] Rush Farmer MD [id number]     Coagulation Studies: No results for input(s): LABPROT, INR in the last 72 hours.  Urinalysis: No results for input(s): COLORURINE, LABSPEC, PHURINE, GLUCOSEU, HGBUR, BILIRUBINUR, KETONESUR,  PROTEINUR, UROBILINOGEN, NITRITE, LEUKOCYTESUR in the last 72 hours.  Invalid input(s): APPERANCEUR    Imaging: No results found.   Medications:   . sodium chloride    . sodium chloride 10 mL/hr at 10/29/20 1351  . sodium chloride    . methocarbamol (ROBAXIN) IV    . vancomycin Stopped (11/03/20 1245)   . (feeding supplement) PROSource Plus  30 mL Oral BID BM  . aspirin EC  325 mg Oral Daily  . calcium acetate  667 mg Oral TID WC  . Chlorhexidine Gluconate Cloth  6 each Topical Q0600  . cinacalcet  60 mg Oral Q breakfast  . cloNIDine  0.3 mg Transdermal Weekly  . [START ON 11/08/2020] darbepoetin (ARANESP) injection - DIALYSIS  100 mcg Intravenous Q Mon-HD  . docusate sodium  100 mg Oral BID  . enoxaparin (LOVENOX) injection  30 mg Subcutaneous Q48H  . feeding supplement (NEPRO CARB STEADY)  237 mL Oral TID BM  . influenza vac split quadrivalent PF  0.5 mL Intramuscular Tomorrow-1000  . metoprolol tartrate  50 mg Oral BID  . multivitamin  1 tablet Oral QHS  . oxymetazoline  1 spray Each Nare BID  . pantoprazole  40 mg Oral Daily  . pregabalin  75 mg Oral QHS  . senna-docusate  1 tablet Oral BID  . sodium chloride flush  3 mL Intravenous Q12H   sodium chloride, acetaminophen, bisacodyl, camphor-menthol, diphenhydrAMINE, HYDROmorphone (DILAUDID) injection, HYDROmorphone, magnesium citrate, methocarbamol **OR** methocarbamol (ROBAXIN) IV, metoCLOPramide **OR** metoCLOPramide (REGLAN) injection, ondansetron **OR** ondansetron (ZOFRAN) IV, oxyCODONE, polyethylene glycol, sodium chloride flush  Assessment/ Plan:  OP HD:[facility]WF [place]  4h 350/500 new edw here 73- 75kg (was82.5kg)2/2.5 bath LUA AVGHep 5000 -Aranesp 43mgIVq 2wks - last 09/27/20 (Verified order) -Hectorol359m IV qHD    Problem/Plan: 1. Anterior shinosteomyelitis:status post I&D per orthopedics. Pathology showedstaph aureus in the tibia. 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 SP min invasive  MVR(bioprosthetic)replacement - on 3/10. Doing well from cardiac standpoint 3. 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 Underwent tooth extraction3/8/22.PerID to get 6 wks IV vanc w/ end date of 11/25/20.Next dialysis will be 11/05/2020 4. ESRD - usual HDMWF. 5. HTN/volume -appears stable at this point. 6. Anemiaof CKD-HGB 7.6 range. S/P 2 units of PRBCs 3/10.Continue darbepoetin 100 mcg next dose will be 11/08/2020 ,no IV Fe due to osteomyelitis.  7. Secondary Hyperparathyroidism -Calcium and phosphorus controlled. Continue sensipar, binders, and hectorol 8. Hx Hep C /liver cirrhosis.    LOS: 38[place]'@TODAY'Z'@7'$ :55 AM

## 2020-11-05 ENCOUNTER — Inpatient Hospital Stay (HOSPITAL_COMMUNITY): Payer: Medicaid Other

## 2020-11-05 DIAGNOSIS — R5081 Fever presenting with conditions classified elsewhere: Secondary | ICD-10-CM | POA: Diagnosis not present

## 2020-11-05 DIAGNOSIS — I639 Cerebral infarction, unspecified: Secondary | ICD-10-CM | POA: Diagnosis not present

## 2020-11-05 DIAGNOSIS — I517 Cardiomegaly: Secondary | ICD-10-CM | POA: Diagnosis not present

## 2020-11-05 DIAGNOSIS — Z89611 Acquired absence of right leg above knee: Secondary | ICD-10-CM | POA: Diagnosis not present

## 2020-11-05 DIAGNOSIS — I330 Acute and subacute infective endocarditis: Secondary | ICD-10-CM | POA: Diagnosis not present

## 2020-11-05 DIAGNOSIS — R079 Chest pain, unspecified: Secondary | ICD-10-CM | POA: Diagnosis not present

## 2020-11-05 DIAGNOSIS — D631 Anemia in chronic kidney disease: Secondary | ICD-10-CM | POA: Diagnosis not present

## 2020-11-05 DIAGNOSIS — I150 Renovascular hypertension: Secondary | ICD-10-CM | POA: Diagnosis not present

## 2020-11-05 DIAGNOSIS — M792 Neuralgia and neuritis, unspecified: Secondary | ICD-10-CM | POA: Diagnosis not present

## 2020-11-05 DIAGNOSIS — I63119 Cerebral infarction due to embolism of unspecified vertebral artery: Secondary | ICD-10-CM | POA: Diagnosis not present

## 2020-11-05 DIAGNOSIS — R0602 Shortness of breath: Secondary | ICD-10-CM | POA: Diagnosis not present

## 2020-11-05 DIAGNOSIS — I132 Hypertensive heart and chronic kidney disease with heart failure and with stage 5 chronic kidney disease, or end stage renal disease: Secondary | ICD-10-CM | POA: Diagnosis not present

## 2020-11-05 DIAGNOSIS — J90 Pleural effusion, not elsewhere classified: Secondary | ICD-10-CM | POA: Diagnosis not present

## 2020-11-05 DIAGNOSIS — Z992 Dependence on renal dialysis: Secondary | ICD-10-CM | POA: Diagnosis not present

## 2020-11-05 DIAGNOSIS — N2581 Secondary hyperparathyroidism of renal origin: Secondary | ICD-10-CM | POA: Diagnosis not present

## 2020-11-05 DIAGNOSIS — Z9889 Other specified postprocedural states: Secondary | ICD-10-CM | POA: Diagnosis not present

## 2020-11-05 DIAGNOSIS — J9811 Atelectasis: Secondary | ICD-10-CM | POA: Diagnosis not present

## 2020-11-05 DIAGNOSIS — I5032 Chronic diastolic (congestive) heart failure: Secondary | ICD-10-CM | POA: Diagnosis not present

## 2020-11-05 DIAGNOSIS — B9561 Methicillin susceptible Staphylococcus aureus infection as the cause of diseases classified elsewhere: Secondary | ICD-10-CM | POA: Diagnosis not present

## 2020-11-05 DIAGNOSIS — R7881 Bacteremia: Secondary | ICD-10-CM | POA: Diagnosis not present

## 2020-11-05 DIAGNOSIS — N186 End stage renal disease: Secondary | ICD-10-CM | POA: Diagnosis not present

## 2020-11-05 LAB — CBC WITH DIFFERENTIAL/PLATELET
Abs Immature Granulocytes: 0.16 10*3/uL — ABNORMAL HIGH (ref 0.00–0.07)
Basophils Absolute: 0.1 10*3/uL (ref 0.0–0.1)
Basophils Relative: 0 %
Eosinophils Absolute: 0.6 10*3/uL — ABNORMAL HIGH (ref 0.0–0.5)
Eosinophils Relative: 4 %
HCT: 24.1 % — ABNORMAL LOW (ref 39.0–52.0)
Hemoglobin: 7.4 g/dL — ABNORMAL LOW (ref 13.0–17.0)
Immature Granulocytes: 1 %
Lymphocytes Relative: 8 %
Lymphs Abs: 1.3 10*3/uL (ref 0.7–4.0)
MCH: 27.1 pg (ref 26.0–34.0)
MCHC: 30.7 g/dL (ref 30.0–36.0)
MCV: 88.3 fL (ref 80.0–100.0)
Monocytes Absolute: 2.2 10*3/uL — ABNORMAL HIGH (ref 0.1–1.0)
Monocytes Relative: 15 %
Neutro Abs: 10.8 10*3/uL — ABNORMAL HIGH (ref 1.7–7.7)
Neutrophils Relative %: 72 %
Platelets: 431 10*3/uL — ABNORMAL HIGH (ref 150–400)
RBC: 2.73 MIL/uL — ABNORMAL LOW (ref 4.22–5.81)
RDW: 18.3 % — ABNORMAL HIGH (ref 11.5–15.5)
WBC: 15.1 10*3/uL — ABNORMAL HIGH (ref 4.0–10.5)
nRBC: 0 % (ref 0.0–0.2)

## 2020-11-05 LAB — RENAL FUNCTION PANEL
Albumin: 2 g/dL — ABNORMAL LOW (ref 3.5–5.0)
Anion gap: 12 (ref 5–15)
BUN: 31 mg/dL — ABNORMAL HIGH (ref 6–20)
CO2: 25 mmol/L (ref 22–32)
Calcium: 8.1 mg/dL — ABNORMAL LOW (ref 8.9–10.3)
Chloride: 93 mmol/L — ABNORMAL LOW (ref 98–111)
Creatinine, Ser: 6.63 mg/dL — ABNORMAL HIGH (ref 0.61–1.24)
GFR, Estimated: 10 mL/min — ABNORMAL LOW (ref >60)
Glucose, Bld: 99 mg/dL (ref 70–99)
Phosphorus: 4.4 mg/dL (ref 2.5–4.6)
Potassium: 3.8 mmol/L (ref 3.5–5.1)
Sodium: 130 mmol/L — ABNORMAL LOW (ref 135–145)

## 2020-11-05 LAB — C-REACTIVE PROTEIN: CRP: 28.8 mg/dL — ABNORMAL HIGH (ref <1.0)

## 2020-11-05 LAB — PROCALCITONIN: Procalcitonin: 0.87 ng/mL

## 2020-11-05 LAB — SEDIMENTATION RATE: Sed Rate: 102 mm/hr — ABNORMAL HIGH (ref 0–16)

## 2020-11-05 IMAGING — DX DG CHEST 1V PORT
1 series · 1 of 1 positions shown · non-contrast
Comparison: [DATE].

CLINICAL DATA: Shortness of breath, chest pain.

EXAM:
PORTABLE CHEST 1 VIEW

[chest]
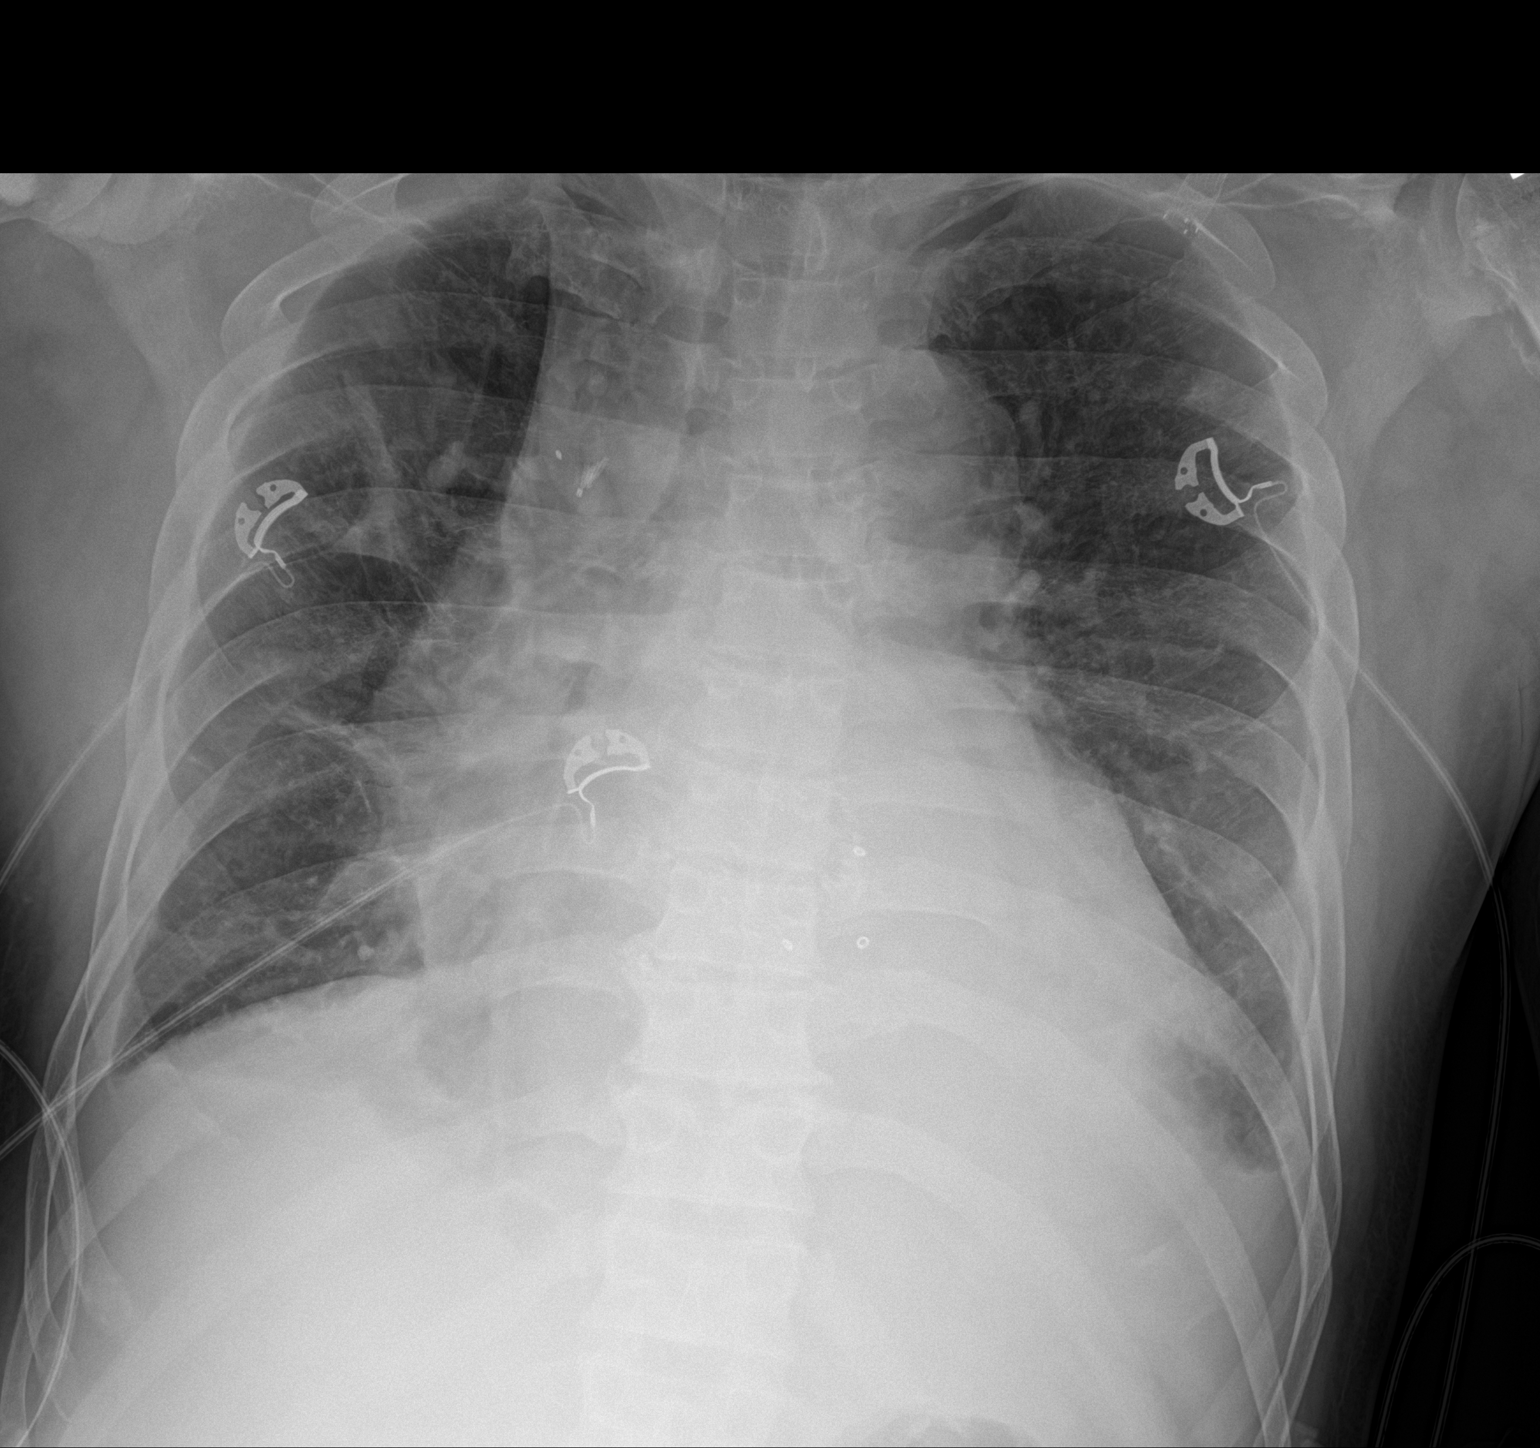

[1 of 1 positions shown; findings below may reference images not displayed]

FINDINGS: Stable cardiomegaly. No pneumothorax is noted. Small bilateral
pleural effusions are noted. Mild bibasilar subsegmental atelectasis
is noted. Right upper lobe opacity is noted concerning for
atelectasis or possibly scarring. Tumoral calcinosis of the right
shoulder is again noted.
IMPRESSION: Mild bibasilar subsegmental atelectasis is noted with small
bilateral pleural effusions. Right upper lobe opacity is again noted
concerning for atelectasis or possibly scarring.

## 2020-11-05 MED ORDER — VANCOMYCIN HCL IN DEXTROSE 750-5 MG/150ML-% IV SOLN
INTRAVENOUS | Status: AC
  Administered 2020-11-05: 750 mg via INTRAVENOUS
  Filled 2020-11-05: qty 150

## 2020-11-05 MED ORDER — OXYCODONE HCL ER 15 MG PO T12A
15.0000 mg | EXTENDED_RELEASE_TABLET | Freq: Two times a day (BID) | ORAL | Status: DC
  Administered 2020-11-05 – 2020-11-10 (×11): 15 mg via ORAL
  Filled 2020-11-05 (×11): qty 1

## 2020-11-05 NOTE — TOC Progression Note (Signed)
Transition of Care [facility]) - Progression Note    Patient Details  Name: Donquez Pioli MRN: [id number] Date of Birth: 03-06-1971  Transition of Care [facility]) CM/SW [place], RN Phone Number: 11/05/2020, 1:56 PM  Clinical Narrative:    CM left a voice mail message with Wandra Feinstein to inquire about current bed offer in the hub for [facility] placement.  The patient currently has wound vac and will be switched over to prevena wound vac if a skilled nursing facility is found for placement.  The patient is fully vaccinated for COVID.  Erin Hearing, NP is aware of the search for [facility] placement and is adjusting the patient's medications to control postoperative pain.  CM and MSW will continue to follow the patient for [facility] placement.   Expected Discharge Plan: [place] Barriers to Discharge: Continued Medical Work up  Expected Discharge Plan and Services Expected Discharge Plan: [place] In-house Referral: Clinical Social Work Discharge Planning Services: CM Consult Post Acute Care Choice: [place] Living arrangements for the past 2 months: Apartment                                       Social Determinants of Health (SDOH) Interventions    Readmission Risk Interventions Readmission Risk Prevention Plan 11/05/2020  Transportation Screening Complete  Medication Review Press photographer) Complete  PCP or Specialist appointment within 3-5 days of discharge Complete  HRI or Home Care Consult Complete  SW Recovery Care/Counseling Consult Complete  Palliative Care Screening Complete  Skilled Nursing Facility Complete  Some recent data might be hidden

## 2020-11-05 NOTE — Plan of Care (Signed)
  Problem: Education: Goal: Knowledge of disease or condition will improve Outcome: Progressing Goal: Knowledge of secondary prevention will improve Outcome: Progressing Goal: Knowledge of patient specific risk factors addressed and post discharge goals established will improve Outcome: Progressing Goal: Individualized Educational Video(s) Outcome: Progressing   

## 2020-11-05 NOTE — Progress Notes (Signed)
PT Cancellation Note  Patient Details Name: Pascal Dey MRN: [id number] DOB: [birth year]   Cancelled Treatment:    Reason Eval/Treat Not Completed: Patient at procedure or test/unavailable (HD)   Cionna Collantes B Pawel Soules 11/05/2020, 11:47 AM  Bayard Males, PT Acute Rehabilitation Services Pager: [phone] Office: [phone]

## 2020-11-05 NOTE — Progress Notes (Signed)
[facility] ROUNDING NOTE   Subjective:   Brief history: 49 year old gentleman with history end-stage renal disease Monday Wednesday Friday dialysis, history of chronic diastolic heart failure with ejection fraction of 55-60%.  History of medical noncompliance, history of liver cirrhosis with hepatitis C, history of alcohol use.  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  Underwent successful minimally invasive mitral valve replacement 10/14/2020.  Was found to have swelling in right leg was evaluated orthopedic with debridement complicated by abscess and osteomyelitis involving tibia.  This area was excised by orthopedic surgery.  Dr. Sharol Given.  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    His last dialysis treatment was 11/03/2020 with 2 L removed.  His next dialysis treatment will be 11/05/2020   Blood pressure 118/81 pulse 101 temperature 99.2 O2 sats 100% room air  Sodium 130 potassium 3.8 chloride 93 CO2 25 BUN 31 creatinine 6.63 glucose 99 calcium 8.1 phosphorus 4.4 albumin 2 hemoglobin 7.4      Objective:  Vital signs in last 24 hours:  Temp:  [98 F (36.7 C)-99.2 F (37.3 C)] 99.2 F (37.3 C) (04/01 0807) Pulse Rate:  [84-99] 99 (04/01 1000) Resp:  [13-27] 21 (04/01 1000) BP: (108-136)/(76-99) 118/81 (04/01 1000) SpO2:  [92 %-100 %] 100 % (04/01 0807) Weight:  [74.2 kg-74.8 kg] 74.2 kg (04/01 0807)  Weight change: 1.2 kg Filed Weights   11/03/20 1125 11/05/20 0446 11/05/20 0807  Weight: 71.3 kg 74.8 kg 74.2 kg    Intake/Output: I/O last 3 completed shifts: In: -  Out: 125 [Drains:125]   Intake/Output this shift:  No intake/output data recorded.  General: Alert dialysis 80 Heart: RRR, soft 1/6 SEM, no rub or gallop Lungs: CTA nonlabored breathing Abdomen: BS positive, soft NTND Extremities: No pedal edema , R AKA wound VAC present  HD access =patent on HD LUA AVG    Basic Metabolic Panel: Recent  Labs  Lab 11/01/20 0100 11/02/20 0100 11/03/20 0310 11/04/20 0129 11/05/20 0204  NA 128* 132* 128* 131* 130*  K 4.1 3.8 3.4* 3.5 3.8  CL 90* 95* 90* 95* 93*  CO2 '26 26 24 26 25  '$ GLUCOSE 120* 108* 94 124* 99  BUN 39* 17 32* 19 31*  CREATININE 7.31* 4.63* 6.79* 4.55* 6.63*  CALCIUM 8.0* 8.3* 8.5* 7.7* 8.1*  PHOS 5.5* 3.3 4.2 3.0 4.4    Liver Function Tests: Recent Labs  Lab 11/01/20 0100 11/02/20 0100 11/03/20 0310 11/04/20 0129 11/05/20 0204  ALBUMIN 2.1* 2.1* 2.2* 1.9* 2.0*   No results for input(s): LIPASE, AMYLASE in the last 168 hours. No results for input(s): AMMONIA in the last 168 hours.  CBC: Recent Labs  Lab 11/01/20 0100 11/02/20 0100 11/03/20 0310 11/04/20 0129 11/05/20 0204  WBC 17.4* 17.3* 18.1* 14.1* 15.1*  NEUTROABS 14.0* 13.4* 13.7* 10.1* 10.8*  HGB 7.0* 7.6* 7.6* 7.1* 7.4*  HCT 21.9* 25.0* 25.5* 23.5* 24.1*  MCV 89.8 89.3 89.2 90.4 88.3  PLT 377 361 410* 390 431*    Cardiac Enzymes: No results for input(s): CKTOTAL, CKMB, CKMBINDEX, TROPONINI in the last 168 hours.  BNP: Invalid input(s): POCBNP  CBG: Recent Labs  Lab 11/03/20 1206  GLUCAP 83    Microbiology: Results for orders placed or performed during the hospital encounter of 09/27/20  Blood Culture (routine x 2)     Status: Abnormal   Collection Time: 09/27/20  2:16 PM   Specimen: BLOOD RIGHT HAND  Result Value Ref Range Status  Specimen Description BLOOD RIGHT HAND  Final   Special Requests   Final    BOTTLES DRAWN AEROBIC AND ANAEROBIC Blood Culture results may not be optimal due to an inadequate volume of blood received in culture bottles Performed at [facility] [address] [place], [place] [address]    Culture  Setup Time   Final    GRAM POSITIVE COCCI IN CLUSTERS IN BOTH AEROBIC AND ANAEROBIC BOTTLES    Culture METHICILLIN RESISTANT STAPHYLOCOCCUS AUREUS (A)  Final   Report Status 09/30/2020 FINAL  Final   Organism ID, Bacteria METHICILLIN RESISTANT  STAPHYLOCOCCUS AUREUS  Final      Susceptibility   Methicillin resistant staphylococcus aureus - MIC*    CIPROFLOXACIN >=8 RESISTANT Resistant     ERYTHROMYCIN >=8 RESISTANT Resistant     GENTAMICIN <=0.5 SENSITIVE Sensitive     OXACILLIN >=4 RESISTANT Resistant     TETRACYCLINE <=1 SENSITIVE Sensitive     VANCOMYCIN <=0.5 SENSITIVE Sensitive     TRIMETH/SULFA <=10 SENSITIVE Sensitive     CLINDAMYCIN <=0.25 SENSITIVE Sensitive     RIFAMPIN <=0.5 SENSITIVE Sensitive     Inducible Clindamycin NEGATIVE Sensitive     * METHICILLIN RESISTANT STAPHYLOCOCCUS AUREUS  Resp Panel by RT-PCR (Flu A&B, Covid) Nasopharyngeal Swab     Status: None   Collection Time: 09/27/20  2:16 PM   Specimen: Nasopharyngeal Swab; Nasopharyngeal(NP) swabs in vial transport medium  Result Value Ref Range Status   SARS Coronavirus 2 by RT PCR NEGATIVE NEGATIVE Final    Comment: (NOTE) SARS-CoV-2 target nucleic acids are NOT DETECTED.  The SARS-CoV-2 RNA is generally detectable in upper respiratory specimens during the acute phase of infection. The lowest concentration of SARS-CoV-2 viral copies this assay can detect is 138 copies/mL. A negative result does not preclude SARS-Cov-2 infection and should not be used as the sole basis for treatment or other patient management decisions. A negative result may occur with  improper specimen collection/handling, submission of specimen other than nasopharyngeal swab, presence of viral mutation(s) within the areas targeted by this assay, and inadequate number of viral copies(<138 copies/mL). A negative result must be combined with clinical observations, patient history, and epidemiological information. The expected result is Negative.  Fact Sheet for Patients:  EntrepreneurPulse.com.au  Fact Sheet for Healthcare Providers:  IncredibleEmployment.be  This test is no t yet approved or cleared by the [place] FDA and  has been  authorized for detection and/or diagnosis of SARS-CoV-2 by FDA under an Emergency Use Authorization (EUA). This EUA will remain  in effect (meaning this test can be used) for the duration of the COVID-19 declaration under Section 564(b)(1) of the Act, 21 U.S.C.section 360bbb-3(b)(1), unless the authorization is terminated  or revoked sooner.       Influenza A by PCR NEGATIVE NEGATIVE Final   Influenza B by PCR NEGATIVE NEGATIVE Final    Comment: (NOTE) The Xpert Xpress SARS-CoV-2/FLU/RSV plus assay is intended as an aid in the diagnosis of influenza from Nasopharyngeal swab specimens and should not be used as a sole basis for treatment. Nasal washings and aspirates are unacceptable for Xpert Xpress SARS-CoV-2/FLU/RSV testing.  Fact Sheet for Patients: EntrepreneurPulse.com.au  Fact Sheet for Healthcare Providers: IncredibleEmployment.be  This test is not yet approved or cleared by the [place] FDA and has been authorized for detection and/or diagnosis of SARS-CoV-2 by FDA under an Emergency Use Authorization (EUA). This EUA will remain in effect (meaning this test can be used) for the duration  of the COVID-19 declaration under Section 564(b)(1) of the Act, 21 U.S.C. section 360bbb-3(b)(1), unless the authorization is terminated or revoked.  Performed at [facility], [place] [address], [place], [place] [address]   Blood Culture ID Panel (Reflexed)     Status: Abnormal   Collection Time: 09/27/20  2:16 PM  Result Value Ref Range Status   Enterococcus faecalis NOT DETECTED NOT DETECTED Final   Enterococcus Faecium NOT DETECTED NOT DETECTED Final   Listeria monocytogenes NOT DETECTED NOT DETECTED Final   Staphylococcus species DETECTED (A) NOT DETECTED Final    Comment: CRITICAL RESULT CALLED TO, READ BACK BY AND VERIFIED WITH: C,PIERCE PHARMD '@1009'$  09/28/20 EB    Staphylococcus aureus (BCID) DETECTED (A) NOT DETECTED Final     Comment: Methicillin (oxacillin)-resistant Staphylococcus aureus (MRSA). MRSA is predictably resistant to beta-lactam antibiotics (except ceftaroline). Preferred therapy is vancomycin unless clinically contraindicated. Patient requires contact precautions if  hospitalized. CRITICAL RESULT CALLED TO, READ BACK BY AND VERIFIED WITH: C,PIERCE PHARMD '@1009'$  09/28/20 EB    Staphylococcus epidermidis NOT DETECTED NOT DETECTED Final   Staphylococcus lugdunensis NOT DETECTED NOT DETECTED Final   Streptococcus species NOT DETECTED NOT DETECTED Final   Streptococcus agalactiae NOT DETECTED NOT DETECTED Final   Streptococcus pneumoniae NOT DETECTED NOT DETECTED Final   Streptococcus pyogenes NOT DETECTED NOT DETECTED Final   A.calcoaceticus-baumannii NOT DETECTED NOT DETECTED Final   Bacteroides fragilis NOT DETECTED NOT DETECTED Final   Enterobacterales NOT DETECTED NOT DETECTED Final   Enterobacter cloacae complex NOT DETECTED NOT DETECTED Final   Escherichia coli NOT DETECTED NOT DETECTED Final   Klebsiella aerogenes NOT DETECTED NOT DETECTED Final   Klebsiella oxytoca NOT DETECTED NOT DETECTED Final   Klebsiella pneumoniae NOT DETECTED NOT DETECTED Final   Proteus species NOT DETECTED NOT DETECTED Final   Salmonella species NOT DETECTED NOT DETECTED Final   Serratia marcescens NOT DETECTED NOT DETECTED Final   Haemophilus influenzae NOT DETECTED NOT DETECTED Final   Neisseria meningitidis NOT DETECTED NOT DETECTED Final   Pseudomonas aeruginosa NOT DETECTED NOT DETECTED Final   Stenotrophomonas maltophilia NOT DETECTED NOT DETECTED Final   Candida albicans NOT DETECTED NOT DETECTED Final   Candida auris NOT DETECTED NOT DETECTED Final   Candida glabrata NOT DETECTED NOT DETECTED Final   Candida krusei NOT DETECTED NOT DETECTED Final   Candida parapsilosis NOT DETECTED NOT DETECTED Final   Candida tropicalis NOT DETECTED NOT DETECTED Final   Cryptococcus neoformans/gattii NOT DETECTED NOT  DETECTED Final   Meth resistant mecA/C and MREJ DETECTED (A) NOT DETECTED Final    Comment: CRITICAL RESULT CALLED TO, READ BACK BY AND VERIFIED WITH: C,PIERCE PHARMD '@1009'$  09/28/20 EB Performed at [facility], [address], [place], [place] [address]   Blood Culture (routine x 2)     Status: Abnormal   Collection Time: 09/27/20  2:29 PM   Specimen: BLOOD  Result Value Ref Range Status   Specimen Description BLOOD SITE NOT SPECIFIED  Final   Special Requests   Final    BOTTLES DRAWN AEROBIC AND ANAEROBIC Blood Culture results may not be optimal due to an inadequate volume of blood received in culture bottles   Culture  Setup Time   Final    GRAM POSITIVE COCCI IN CLUSTERS ANAEROBIC BOTTLE ONLY CRITICAL VALUE NOTED.  VALUE IS CONSISTENT WITH PREVIOUSLY REPORTED AND CALLED VALUE.    Culture (A)  Final    STAPHYLOCOCCUS AUREUS SUSCEPTIBILITIES PERFORMED ON PREVIOUS CULTURE WITHIN THE LAST  5 DAYS. Performed at [facility], [place] [address] [place], [place] [address]    Report Status 10/01/2020 FINAL  Final  Culture, blood (routine x 2)     Status: None   Collection Time: 09/30/20  6:04 AM   Specimen: BLOOD RIGHT FOREARM  Result Value Ref Range Status   Specimen Description BLOOD RIGHT FOREARM  Final   Special Requests   Final    BOTTLES DRAWN AEROBIC AND ANAEROBIC Blood Culture adequate volume   Culture   Final    NO GROWTH 5 DAYS Performed at [facility], [place] [address], [place], [place] [address]    Report Status 10/05/2020 FINAL  Final  Culture, blood (routine x 2)     Status: Abnormal   Collection Time: 09/30/20  6:08 AM   Specimen: BLOOD RIGHT HAND  Result Value Ref Range Status   Specimen Description BLOOD RIGHT HAND  Final   Special Requests   Final    BOTTLES DRAWN AEROBIC AND ANAEROBIC Blood Culture results may not be optimal due to an inadequate volume of blood received in culture bottles   Culture  Setup Time   Final    GRAM POSITIVE COCCI IN  CLUSTERS ANAEROBIC BOTTLE ONLY CRITICAL VALUE NOTED.  VALUE IS CONSISTENT WITH PREVIOUSLY REPORTED AND CALLED VALUE.    Culture (A)  Final    STAPHYLOCOCCUS AUREUS SUSCEPTIBILITIES PERFORMED ON PREVIOUS CULTURE WITHIN THE LAST 5 DAYS. Performed at [facility], [place] [address] [place], [place] [address]    Report Status 10/03/2020 FINAL  Final  Culture, blood (routine x 2)     Status: None   Collection Time: 10/03/20  2:42 AM   Specimen: BLOOD  Result Value Ref Range Status   Specimen Description BLOOD LEFT ARM  Final   Special Requests   Final    BOTTLES DRAWN AEROBIC AND ANAEROBIC Blood Culture results may not be optimal due to an inadequate volume of blood received in culture bottles   Culture   Final    NO GROWTH 5 DAYS Performed at [facility], [place] [address], [place], [place] [address]    Report Status 10/08/2020 FINAL  Final  Culture, blood (routine x 2)     Status: None   Collection Time: 10/03/20  2:42 AM   Specimen: BLOOD  Result Value Ref Range Status   Specimen Description BLOOD LEFT HAND  Final   Special Requests   Final    BOTTLES DRAWN AEROBIC ONLY Blood Culture results may not be optimal due to an inadequate volume of blood received in culture bottles   Culture   Final    NO GROWTH 5 DAYS Performed at [facility], [place] [address], [place], [place] [address]    Report Status 10/08/2020 FINAL  Final  Surgical pcr screen     Status: Abnormal   Collection Time: 10/11/20  6:44 PM   Specimen: Nasal Mucosa; Nasal Swab  Result Value Ref Range Status   MRSA, PCR POSITIVE (A) NEGATIVE Final    Comment: RESULT CALLED TO, READ BACK BY AND VERIFIED WITH: F COMBRES RN 10/11/20 2339 JDW    Staphylococcus aureus POSITIVE (A) NEGATIVE Final    Comment: (NOTE) The Xpert SA Assay (FDA approved for NASAL specimens in patients 85 years of age and older), is one component of a comprehensive surveillance program. It is not intended to diagnose infection nor  to guide or monitor treatment. Performed at [facility], [address], [place], [place] [address]   Culture, blood (routine x 2)     Status: None   Collection Time: 10/13/20 10:43 PM   Specimen: BLOOD  Result Value Ref Range Status   Specimen Description BLOOD RIGHT THUMB  Final   Special Requests   Final    BOTTLES DRAWN AEROBIC AND ANAEROBIC Blood Culture adequate volume   Culture   Final    NO GROWTH 5 DAYS Performed at [facility], [place] [address] [place], [place] [address]    Report Status 10/19/2020 FINAL  Final  Culture, blood (routine x 2)     Status: None   Collection Time: 10/13/20 10:43 PM   Specimen: BLOOD RIGHT HAND  Result Value Ref Range Status   Specimen Description BLOOD RIGHT HAND  Final   Special Requests   Final    BOTTLES DRAWN AEROBIC AND ANAEROBIC Blood Culture adequate volume   Culture   Final    NO GROWTH 5 DAYS Performed at [facility], [place] [address], [place], [place] [address]    Report Status 10/19/2020 FINAL  Final  Aerobic/Anaerobic Culture w Gram Stain (surgical/deep wound)     Status: None   Collection Time: 10/14/20 10:38 AM   Specimen: Soft Tissue, Other  Result Value Ref Range Status   Specimen Description TISSUE MITRAL VALVE VEGETATION SPEC A  Final   Special Requests MITRAL VALVE VEGETATION  Final   Gram Stain   Final    WBC PRESENT,BOTH PMN AND MONONUCLEAR NO ORGANISMS SEEN    Culture   Final    No growth aerobically or anaerobically. Performed at [facility], [place] [address] [place], [place] [address]    Report Status 10/19/2020 FINAL  Final  Aerobic/Anaerobic Culture w Gram Stain (surgical/deep wound)     Status: None   Collection Time: 10/20/20  4:12 PM   Specimen: Soft Tissue, Other  Result Value Ref Range Status   Specimen Description TISSUE  Final   Special Requests RIGHT LEG ABSC SPEC A  Final   Gram Stain   Final    RARE WBC PRESENT,BOTH PMN AND MONONUCLEAR NO ORGANISMS SEEN    Culture    Final    FEW METHICILLIN RESISTANT STAPHYLOCOCCUS AUREUS CRITICAL RESULT CALLED TO, READ BACK BY AND VERIFIED WITH: RN N.[email or web address] AT [id number] ON 10/22/2020 BY T.SAAD NO ANAEROBES ISOLATED Performed at [facility], [place] [address], [place], [place] [address]    Report Status 10/26/2020 FINAL  Final   Organism ID, Bacteria METHICILLIN RESISTANT STAPHYLOCOCCUS AUREUS  Final      Susceptibility   Methicillin resistant staphylococcus aureus - MIC*    CIPROFLOXACIN >=8 RESISTANT Resistant     ERYTHROMYCIN >=8 RESISTANT Resistant     GENTAMICIN <=0.5 SENSITIVE Sensitive     OXACILLIN >=4 RESISTANT Resistant     TETRACYCLINE <=1 SENSITIVE Sensitive     VANCOMYCIN 1 SENSITIVE Sensitive     TRIMETH/SULFA <=10 SENSITIVE Sensitive     CLINDAMYCIN <=0.25 SENSITIVE Sensitive     RIFAMPIN <=0.5 SENSITIVE Sensitive     Inducible Clindamycin NEGATIVE Sensitive     * FEW METHICILLIN RESISTANT STAPHYLOCOCCUS AUREUS  Fungus Stain     Status: None   Collection Time: 10/20/20  4:12 PM   Specimen: Soft Tissue, Other  Result Value Ref Range Status   FUNGUS STAIN Final report  Final    Comment: (NOTE) Performed At: [facility]  [place], [place]  [id number] Rush Farmer MD [id number]    Fungal Source TISSUE  Final    Comment: RIGHT LEG ABSC SPEC A Performed at [facility], [place] [address], [place], [place] [address]   Fungal Stain reflex     Status: None   Collection Time: 10/20/20  4:12 PM  Result Value Ref Range Status   Fungal stain result 1 Comment  Final    Comment: (NOTE) KOH/Calcofluor preparation:  no fungus observed. Performed At: [facility] [place], [place] [id number] Rush Farmer MD [id number]     Coagulation Studies: No results for input(s): LABPROT, INR in the last 72 hours.  Urinalysis: No results for input(s): COLORURINE, LABSPEC, PHURINE, GLUCOSEU, HGBUR, BILIRUBINUR, KETONESUR, PROTEINUR, UROBILINOGEN, NITRITE,  LEUKOCYTESUR in the last 72 hours.  Invalid input(s): APPERANCEUR    Imaging: No results found.   Medications:   . sodium chloride    . sodium chloride 10 mL/hr at 10/29/20 1351  . sodium chloride    . methocarbamol (ROBAXIN) IV    . vancomycin 750 mg (11/05/20 1025)   . (feeding supplement) PROSource Plus  30 mL Oral BID BM  . aspirin EC  325 mg Oral Daily  . calcium acetate  667 mg Oral TID WC  . Chlorhexidine Gluconate Cloth  6 each Topical Q0600  . Chlorhexidine Gluconate Cloth  6 each Topical Q0600  . cinacalcet  60 mg Oral Q breakfast  . cloNIDine  0.3 mg Transdermal Weekly  . [START ON 11/08/2020] darbepoetin (ARANESP) injection - DIALYSIS  100 mcg Intravenous Q Mon-HD  . docusate sodium  100 mg Oral BID  . enoxaparin (LOVENOX) injection  30 mg Subcutaneous Q24H  . feeding supplement (NEPRO CARB STEADY)  237 mL Oral TID BM  . influenza vac split quadrivalent PF  0.5 mL Intramuscular Tomorrow-1000  . metoprolol tartrate  50 mg Oral BID  . multivitamin  1 tablet Oral QHS  . oxyCODONE  15 mg Oral Q12H  . oxymetazoline  1 spray Each Nare BID  . pantoprazole  40 mg Oral Daily  . pregabalin  75 mg Oral QHS  . senna-docusate  1 tablet Oral BID  . sodium chloride flush  3 mL Intravenous Q12H   sodium chloride, acetaminophen, bisacodyl, camphor-menthol, diphenhydrAMINE, HYDROmorphone (DILAUDID) injection, HYDROmorphone, magnesium citrate, methocarbamol **OR** methocarbamol (ROBAXIN) IV, metoCLOPramide **OR** metoCLOPramide (REGLAN) injection, ondansetron **OR** ondansetron (ZOFRAN) IV, oxyCODONE, polyethylene glycol, sodium chloride flush  Assessment/ Plan:  OP HD:MWF [facility]  4h 350/500 new edw here 73- 75kg (was82.5kg)2/2.5 bath LUA AVGHep 5000 -Aranesp 75mgIVq 2wks - last 09/27/20 (Verified order) -Hectorol358m IV qHD    Problem/Plan: 1. Anterior shinosteomyelitis:status post I&D per orthopedics. Pathology showedstaph aureus in the tibia. SP R AKA  per ortho 10/29/20 2. SP min invasive MVR(bioprosthetic)replacement - on 3/10. Doing well from cardiac standpoint 3. 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 Underwent tooth extraction3/8/22.PerID to get 6 wks IV vanc w/ end date of 11/25/20.Next dialysis will be 11/05/2020 4. ESRD - usual HDMWF. 5. HTN/volume -appears stable at this point. 6. Anemiaof CKD-HGB 7.6 range. S/P 2 units of PRBCs 3/10.Continue darbepoetin 100 mcg next dose will be 11/08/2020 ,no IV Fe due to osteomyelitis.  7. Secondary Hyperparathyroidism -Calcium and phosphorus controlled. Continue sensipar, binders, and hectorol 8. Hx Hep C /liver cirrhosis.    LOS: 39[place]'@TODAY'C'@11'$ :06 AM

## 2020-11-05 NOTE — Progress Notes (Signed)
Mobility Specialist: Progress Note   11/05/20 1603  Mobility  Activity Refused mobility   Pt refused mobility stating he is tired and has been feeling SOB, RN notified. Encouraged pt to get up with staff over the weekend.   [facility] Romeka Scifres Mobility Specialist Mobility Specialist Phone: [phone]

## 2020-11-05 NOTE — Progress Notes (Addendum)
[facility] PROGRESS NOTE  Patrick Anthony [id number] DOB: 1970/11/25 DOA: 09/27/2020 PCP: [name], MD  Status: Remains inpatient appropriate because:Ongoing active pain requiring inpatient pain management, Unsafe d/c plan and IV treatments appropriate due to intensity of illness or inability to take PO   Dispo: The patient is from: Home              Anticipated d/c is to: SNF              Patient currently is not medically stable to d/c. 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  Seen by ID managed with IV antibiotic, CT surgery consult salt was obtained subsequently underwent minimally invasive mitral valve replacement on 10/14/2020 and was managed under CT surgery service and subsequently transferred to [facility] on 3/16.  Patient was followed by ID, orthopedics.  He was found to have swelling on the right leg and seen by orthopedic underwent debridement on the right leg and treated for large complicated abscess with osteomyelitis involving his tibia with 7 by X.3 centimeter area of bone that was excised. Patient was seen by Dr. Sharol Given orthopedic ID and the right above-the-knee amputation has been recommended moving forward to manage this infection. Patient has been told by multiple providers multiple times the need for  the above-the-knee amputation to avoid seeding of his new valve and his graft but has been reluctant and he is aware about dying from staph aureus bacteremia and sepsis. 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 He has had significant anemia in the setting of surgery and wound VAC with bloody drainage  Subjective: Awakened during dialysis.  States pain is a little bit better with adjustments in medications on 3/31.  Discussed with patient that it may take a few days before Lyrica full effects felt but will go ahead and increase the longer acting oxycodone from 10 mg to 15 mg.  Patient in agreement with this plan.  Objective: Vitals:   11/05/20 0311 11/05/20 0730  BP: 118/86 (!) 136/91  Pulse: 93 96  Resp: 18 20  Temp: 98.6 F (37 C) 99.2 F (37.3 C)  SpO2: 94% 98%   No intake or output data in the 24 hours ending 11/05/20 0825 Filed Weights   11/03/20 0745 11/03/20 1125 11/05/20 0446  Weight: 73.6 kg 71.3 kg 74.8 kg    Exam:  Constitutional: Alert, calm, no acute distress Respiratory: Supine, no difficulty breathing, anterior lung sounds clear.  Room air Cardiovascular: Heart sounds are normal, pulses regular, no peripheral edema, noted with right AKA Abdomen:  LBM 3/31, soft and nontender.  Eating well.  Normoactive bowel sounds. Musculoskeletal: s/p R AKA with wound VAC in place, exam otherwise unremarkable Neurologic: CN 2-12 grossly intact. Sensation intact, DTR normal. Strength 5/5 x all 4 extremities.  Psychiatric: Alert orient x3.  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 Repeat blood cultures from 10/13/20 are negative. -WBCs trending downward from a peak of 21.8 on 3/26 and as of 3/31 [id number]  Large complicated abscess with osteomyelitis involving his right tibia- S/P Rt AKA 3/25 -Followed by ID, vascular, CT surgery and Dr. Sharol Given  -Patient initially was very reluctant to proceed with AKA but given clinical status eventually agreed -Pain adequately controlled with combination of low-dose IV Dilaudid and modest dose oral oxycodone -We will discontinue Neurontin in favor of Lyrica 75 mg at at bedtime -Increase OxyContin to 15 mg every 12 hours.  -Plan to eventually wean and discontinue IV Dilaudid then wean and discontinue oral oxycodone as OxyContin dose titrated upward.  -Eventual goal is to eventually wean and discontinue OxyContin   Status post right AKA continue pain management and wound care as above, wound VAC in place-with bloody drainage likely contributing to anemia.  Incision (Closed) 10/14/20 Groin Right (Active)  Date First Assessed/Time First Assessed: 10/14/20 1132   Location: Groin  Location Orientation: Right    Assessments 10/16/2020  8:00 PM 11/04/2020  8:35 PM  Dressing Type None None  Site / Wound Assessment Clean;Dry Clean;Dry  Margins Attached edges (approximated) Attached edges (approximated)  Closure -- Skin glue;Approximated  Drainage Amount None None  Treatment Other (Comment) --     No Linked orders to display     Incision (Closed) 10/14/20 Chest Right (Active)  Date First Assessed/Time First Assessed: 10/14/20 1134   Location: Chest  Location Orientation: Right    Assessments 10/14/2020  2:15 PM 11/04/2020  8:35 PM  Dressing Type Silver hydrofiber None  Dressing Clean;Dry;Intact --  Dressing Change Frequency Other (Comment) --  Site / Wound Assessment Dressing in place / Unable to assess Clean;Dry  Margins -- Attached edges (approximated)  Closure -- Skin glue;Approximated  Drainage Amount -- None  Treatment Other  (Comment) --     No Linked orders to display     Incision (Closed) 10/20/20 Leg Right (Active)  Date First Assessed/Time First Assessed: 10/20/20 1656   Location: Leg  Location Orientation: Right    Assessments 10/21/2020  8:30 AM 11/03/2020  7:15 AM  Dressing Type Compression wrap Negative pressure wound therapy  Dressing Clean;Dry;Intact Clean;Dry;Intact  Site / Wound Assessment Dressing in place / Unable to assess --  Drainage Amount -- Minimal  Drainage Description -- Sanguineous     No Linked orders to display     Incision (Closed) 10/29/20 Knee Right (Active)  Date First Assessed/Time First Assessed: 10/29/20 1341   Location: Knee  Location Orientation: Right    Assessments 10/29/2020  3:00 PM 11/04/2020  8:35 PM  Dressing Type Other (Comment) Negative pressure wound therapy  Dressing Clean;Dry;Intact Clean;Dry;Intact  Site / Wound Assessment Dressing in place / Unable to assess Dressing in place / Unable to assess  Drainage Amount None --     No Linked orders to display     Negative Pressure Wound Therapy Knee Right (Active)  Placement Date/Time: 10/29/20 1439   Wound Type: Surgical (Open wound)  Location: Knee  Location Orientation: Right    Assessments 10/29/2020  3:00 PM 11/04/2020  8:35 PM  Site /  Wound Assessment Dressing in place / Unable to assess Dressing in place / Unable to assess  Peri-wound Assessment -- Intact  Cycle -- Continuous  Target Pressure (mmHg) 125 125  Dressing Status -- Intact  Output (mL) 0 mL --     No Linked orders to display    ESRD and HD MWF:  -Appreciate nephrology assistance  Acute blood loss anemia in the setting of surgery, and wound VAC bloody draiange/Anemia of chronic kidney disease -S/p 3 units PRBC -Continue aranesp and dose was increased.   -No iron due to acute infection.   -Hemoglobin 7.1 as of 3/31-during the early portion of hospitalization patient's hemoglobin has ranged between 8.9 and 10.0.  He may benefit from  additional transfusion..  Hypertension  -Continue metoprolol and clonidine patch as well as regular hemodialysis.  Nutrition Status: Nutrition Problem: Increased nutrient needs Etiology: chronic illness (ESRD on HD) Signs/Symptoms: estimated needs Interventions: Nepro shake,Refer to RD note for recommendations Estimated body mass index is 22.36 kg/m as calculated from the following:   Height as of this encounter: 6' 0.01" (1.829 m).   Weight as of this encounter: 74.8 kg.   Other problems: Hyperkalemia:  -Resolved  Secondary hyperparathyroidism -continue Sensipar binders and Hectorol per nephrology.   History of hepatitis C/liver cirrhosis -outpatient follow-up with ID. -Monitor LFTs intermittently.    Hyponatremia  -Not unexpected in dialysis patient and can be managed with dialysate per nephrology   Nasal bleeding  -3/20 resolved.  Data Reviewed: Basic Metabolic Panel: Recent Labs  Lab 11/01/20 0100 11/02/20 0100 11/03/20 0310 11/04/20 0129 11/05/20 0204  NA 128* 132* 128* 131* 130*  K 4.1 3.8 3.4* 3.5 3.8  CL 90* 95* 90* 95* 93*  CO2 '26 26 24 26 25  '$ GLUCOSE 120* 108* 94 124* 99  BUN 39* 17 32* 19 31*  CREATININE 7.31* 4.63* 6.79* 4.55* 6.63*  CALCIUM 8.0* 8.3* 8.5* 7.7* 8.1*  PHOS 5.5* 3.3 4.2 3.0 4.4   Liver Function Tests: Recent Labs  Lab 11/01/20 0100 11/02/20 0100 11/03/20 0310 11/04/20 0129 11/05/20 0204  ALBUMIN 2.1* 2.1* 2.2* 1.9* 2.0*   No results for input(s): LIPASE, AMYLASE in the last 168 hours. No results for input(s): AMMONIA in the last 168 hours. CBC: Recent Labs  Lab 11/01/20 0100 11/02/20 0100 11/03/20 0310 11/04/20 0129 11/05/20 0204  WBC 17.4* 17.3* 18.1* 14.1* 15.1*  NEUTROABS 14.0* 13.4* 13.7* 10.1* 10.8*  HGB 7.0* 7.6* 7.6* 7.1* 7.4*  HCT 21.9* 25.0* 25.5* 23.5* 24.1*  MCV 89.8 89.3 89.2 90.4 88.3  PLT 377 361 410* 390 431*   Cardiac Enzymes: No results for input(s): CKTOTAL, CKMB, CKMBINDEX, TROPONINI in the  last 168 hours. BNP (last 3 results) No results for input(s): BNP in the last 8760 hours.  ProBNP (last 3 results) No results for input(s): PROBNP in the last 8760 hours.  CBG: Recent Labs  Lab 11/03/20 1206  GLUCAP 83    No results found for this or any previous visit (from the past 240 hour(s)).   Studies: No results found.  Scheduled Meds: . (feeding supplement) PROSource Plus  30 mL Oral BID BM  . aspirin EC  325 mg Oral Daily  . calcium acetate  667 mg Oral TID WC  . Chlorhexidine Gluconate Cloth  6 each Topical Q0600  . Chlorhexidine Gluconate Cloth  6 each Topical Q0600  . cinacalcet  60 mg Oral Q breakfast  . cloNIDine  0.3 mg Transdermal Weekly  . [START ON 11/08/2020] darbepoetin (ARANESP)  injection - DIALYSIS  100 mcg Intravenous Q Mon-HD  . docusate sodium  100 mg Oral BID  . enoxaparin (LOVENOX) injection  30 mg Subcutaneous Q24H  . feeding supplement (NEPRO CARB STEADY)  237 mL Oral TID BM  . influenza vac split quadrivalent PF  0.5 mL Intramuscular Tomorrow-1000  . metoprolol tartrate  50 mg Oral BID  . multivitamin  1 tablet Oral QHS  . oxyCODONE  10 mg Oral Q12H  . oxymetazoline  1 spray Each Nare BID  . pantoprazole  40 mg Oral Daily  . pregabalin  75 mg Oral QHS  . senna-docusate  1 tablet Oral BID  . sodium chloride flush  3 mL Intravenous Q12H   Continuous Infusions: . sodium chloride    . sodium chloride 10 mL/hr at 10/29/20 1351  . sodium chloride    . methocarbamol (ROBAXIN) IV    . vancomycin Stopped (11/03/20 1245)    Principal Problem:   Bacterial endocarditis Active Problems:   Chronic combined systolic and diastolic congestive heart failure (HCC)   HTN (hypertension)   Anemia in chronic kidney disease   Hepatitis C   ESRD on dialysis (HCC)   End stage renal disease (HCC)   Chest pain   Sepsis ([place])   Pneumonia due to infectious agent   ADPKD (autosomal dominant polycystic kidney disease)   Sepsis due to pneumonia (HCC)   Lactic  acidosis   Cerebral embolism with cerebral infarction   Bacteremia due to Staphylococcus aureus   Mitral regurgitation   Uncontrolled hypertension   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   Patent foramen ovale   Dental caries   Chronic apical periodontitis   Chronic periodontitis   Accretions on teeth   S/P minimally-invasive mitral valve replacement with bioprosthetic valve   S/P mitral valve repair   Abscess   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)   Abscess of right leg   S/P mitral valve replacement   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)   S/P AKA (above knee amputation), right (HCC)   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   Consultants:  Neurology  Infectious disease  Nephrology  Cardiothoracic surgery  Orthopedic surgery  CIR physician  Palliative medicine  Procedures:  Echocardiogram with bubble study  Continuous EEG  TEE  Intraoperative TEE  Follow-up echocardiogram  Left heart catheterization  Multiple dental extractions with alveoloplasty  Minimally invasive mitral valve replacement using Medtronic Mosaic  Irrigation and debridement of leg  Right above-the-knee amputation  Antibiotics: Anti-infectives (From admission, onward)   Start     Dose/Rate Route Frequency Ordered Stop   10/30/20 1100  vancomycin (VANCOCIN) IVPB 750 mg/150 ml premix        750 mg 150 mL/hr over 60 Minutes Intravenous Once in dialysis 10/30/20 0833 10/30/20 1317   10/29/20 1000  ceFAZolin (ANCEF) IVPB 2g/100 mL premix  Status:  Discontinued        2 g 200 mL/hr over 30 Minutes Intravenous To Short Stay 10/29/20 0730 10/29/20 1634   10/27/20 2015  vancomycin (VANCOCIN) IVPB 750 mg/150 ml premix        750 mg 150 mL/hr over 60 Minutes Intravenous Every M-W-F (Hemodialysis) 10/27/20 2005     10/25/20 0922  vancomycin (VANCOCIN) 500-5 MG/100ML-% IVPB       Note to Pharmacy: Kalman Shan   : cabinet  override      10/25/20 0922 10/25/20 0958   10/21/20 0600  ceFAZolin (ANCEF) IVPB 2g/100 mL premix        2 g 200 mL/hr over 30 Minutes Intravenous On call to O.R. 10/20/20 1349 10/21/20 0650   10/20/20 2243  vancomycin (VANCOCIN) 500-5 MG/100ML-% IVPB       Note to Pharmacy: Wallace Cullens   : cabinet override      10/20/20 2243 10/20/20 2243   10/20/20 1437  ceFAZolin (ANCEF) 2-4 GM/100ML-% IVPB       Note to Pharmacy: Ladoris Gene   : cabinet override      10/20/20 1437 10/20/20 1612   10/20/20 1200  vancomycin (VANCOREADY) IVPB 500 mg/100 mL  Status:  Discontinued        500 mg 100 mL/hr over 60 Minutes Intravenous Every M-W-F (Hemodialysis) 10/20/20 0903 10/27/20 2005   10/18/20 1124  vancomycin variable dose per unstable renal function (pharmacist dosing)  Status:  Discontinued         Does not apply See admin instructions 10/18/20 1124 10/20/20 0903   10/16/20 1600  vancomycin (VANCOREADY) IVPB 500 mg/100 mL  Status:  Discontinued        500 mg 100 mL/hr over 60 Minutes Intravenous Every Sat (Hemodialysis) 10/16/20 1306 10/16/20 1312   10/15/20 1500  vancomycin (VANCOCIN) IVPB 500 mg/100 ml premix  Status:  Discontinued        500 mg 100 mL/hr over 60 Minutes Intravenous Every M-W-F (Hemodialysis) 10/15/20 1357 10/17/20 0942   10/14/20 2130  vancomycin (VANCOCIN) IVPB 1000 mg/200 mL premix  Status:  Discontinued        1,000 mg 200 mL/hr over 60 Minutes Intravenous  Once 10/14/20 1403 10/14/20 1547   10/14/20 2130  vancomycin (VANCOREADY) IVPB 500 mg/100 mL        500 mg 100 mL/hr over 60 Minutes Intravenous  Once 10/14/20 1547 10/14/20 2222   10/14/20 1730  cefUROXime (ZINACEF) 1.5 g in sodium chloride 0.9 % 100 mL IVPB        1.5 g 200 mL/hr over 30 Minutes Intravenous Every 24 hr x 2 10/14/20 1403 10/15/20 1718   10/14/20 1128  vancomycin (VANCOCIN) 1,000 mg in sodium chloride 0.9 % 1,000 mL irrigation  Status:  Discontinued          As needed 10/14/20 1128 10/14/20 1401    10/14/20 0400  vancomycin (VANCOREADY) IVPB 1250 mg/250 mL        1,250 mg 166.7 mL/hr over 90 Minutes Intravenous To Surgery 10/13/20 1039 10/14/20 0910   10/14/20 0400  cefUROXime (ZINACEF) 1.5 g in sodium chloride 0.9 % 100 mL IVPB        1.5 g 200 mL/hr over 30 Minutes Intravenous To Surgery 10/13/20 1039 10/14/20 0835   10/14/20 0400  cefUROXime (ZINACEF) 750 mg in sodium chloride 0.9 % 100 mL IVPB        750 mg 200 mL/hr over 30 Minutes Intravenous To Surgery 10/13/20 1039 10/14/20 1250   10/14/20 0400  vancomycin (VANCOCIN) 1,000 mg in sodium chloride 0.9 % 1,000 mL irrigation  Status:  Discontinued         Irrigation To Surgery 10/13/20 1039 10/14/20 1403   10/13/20 1657  vancomycin (VANCOCIN) 500-5 MG/100ML-% IVPB       Note to Pharmacy: Judieth Keens  : cabinet override      10/13/20 1657 10/13/20 1717   10/12/20 0600  ceFAZolin (ANCEF) IVPB 2g/100 mL premix        2 g 200 mL/hr over 30 Minutes Intravenous On call  to O.R. 10/11/20 1841 10/12/20 1315   10/11/20 1415  vancomycin (VANCOCIN) IVPB 500 mg/100 ml premix  Status:  Discontinued        500 mg 100 mL/hr over 60 Minutes Intravenous Every M-W-F (Hemodialysis) 10/11/20 1413 10/14/20 1403   10/11/20 1413  vancomycin (VANCOCIN) 500-5 MG/100ML-% IVPB       Note to Pharmacy: Kalman Shan   : cabinet override      10/11/20 1413 10/12/20 0229   10/11/20 1353  Vancomycin (VANCOCIN) 750-5 MG/150ML-% IVPB  Status:  Discontinued       Note to Pharmacy: Kalman Shan   : cabinet override      10/11/20 1353 10/11/20 1417   10/08/20 1200  vancomycin (VANCOCIN) IVPB 750 mg/150 ml premix  Status:  Discontinued        750 mg 150 mL/hr over 60 Minutes Intravenous Every M-W-F (Hemodialysis) 10/05/20 1404 10/11/20 1413   10/06/20 1619  vancomycin (VANCOCIN) 500-5 MG/100ML-% IVPB       Note to Pharmacy: Judieth Keens  : cabinet override      10/06/20 1619 10/06/20 1736   10/06/20 1200  vancomycin (VANCOCIN) IVPB 750 mg/150 ml  premix  Status:  Discontinued        750 mg 150 mL/hr over 60 Minutes Intravenous Every M-W-F (Hemodialysis) 10/04/20 0905 10/05/20 1404   10/06/20 1200  vancomycin (VANCOCIN) IVPB 500 mg/100 ml premix        500 mg 100 mL/hr over 60 Minutes Intravenous Every Wed (Hemodialysis) 10/05/20 1404 10/06/20 2000   10/01/20 2000  DAPTOmycin (CUBICIN) 840 mg in sodium chloride 0.9 % IVPB  Status:  Discontinued        840 mg 233.6 mL/hr over 30 Minutes Intravenous Every 48 hours 10/01/20 0922 10/01/20 1056   10/01/20 1200  vancomycin (VANCOCIN) IVPB 1000 mg/200 mL premix  Status:  Discontinued        1,000 mg 200 mL/hr over 60 Minutes Intravenous Every M-W-F (Hemodialysis) 10/01/20 1056 10/04/20 0845   09/29/20 1200  vancomycin (VANCOCIN) IVPB 1000 mg/200 mL premix  Status:  Discontinued        1,000 mg 200 mL/hr over 60 Minutes Intravenous Every M-W-F (Hemodialysis) 09/27/20 1443 10/01/20 0922   09/29/20 1200  ceFEPIme (MAXIPIME) 2 g in sodium chloride 0.9 % 100 mL IVPB  Status:  Discontinued        2 g 200 mL/hr over 30 Minutes Intravenous Every M-W-F (Hemodialysis) 09/27/20 1443 09/28/20 1054   09/28/20 0900  metroNIDAZOLE (FLAGYL) IVPB 500 mg  Status:  Discontinued        500 mg 100 mL/hr over 60 Minutes Intravenous Every 12 hours 09/27/20 1808 09/28/20 1054   09/28/20 0900  ceFEPIme (MAXIPIME) 1 g in sodium chloride 0.9 % 100 mL IVPB  Status:  Discontinued        1 g 200 mL/hr over 30 Minutes Intravenous Every 12 hours 09/27/20 1808 09/27/20 1812   09/27/20 1445  vancomycin (VANCOREADY) IVPB 2000 mg/400 mL        2,000 mg 200 mL/hr over 120 Minutes Intravenous  Once 09/27/20 1433 09/27/20 2330   09/27/20 1430  ceFEPIme (MAXIPIME) 2 g in sodium chloride 0.9 % 100 mL IVPB        2 g 200 mL/hr over 30 Minutes Intravenous  Once 09/27/20 1418 09/27/20 1541   09/27/20 1430  metroNIDAZOLE (FLAGYL) IVPB 500 mg        500 mg 100 mL/hr over 60 Minutes Intravenous  Once 09/27/20  1418 09/27/20 2214    09/27/20 1430  vancomycin (VANCOREADY) IVPB 1750 mg/350 mL  Status:  Discontinued        1,750 mg 175 mL/hr over 120 Minutes Intravenous  Once 09/27/20 1418 09/27/20 1433       Time spent: 35 minutes    Erin Hearing ANP  [facility] 7 am - 330 pm/M-F for direct patient care and secure chat Please refer to Amion for contact info 39  days    Attending addendum I have seen the patient this morning personally and examined. Agree with above assessment plan. He is getting dialysis.He has a VAC in place for this right AKA stump He reports his pain is getting better on Lyrica and OxyContin. Briefly at this time biggest issue is doing him off IV narcotics continue on Lyrica oral opiates, continue dialysis per schedule and IV antibiotics.  He is waiting for skilled nursing facility placement.

## 2020-11-06 DIAGNOSIS — N2581 Secondary hyperparathyroidism of renal origin: Secondary | ICD-10-CM | POA: Diagnosis not present

## 2020-11-06 DIAGNOSIS — Z992 Dependence on renal dialysis: Secondary | ICD-10-CM | POA: Diagnosis not present

## 2020-11-06 DIAGNOSIS — I132 Hypertensive heart and chronic kidney disease with heart failure and with stage 5 chronic kidney disease, or end stage renal disease: Secondary | ICD-10-CM | POA: Diagnosis not present

## 2020-11-06 DIAGNOSIS — I639 Cerebral infarction, unspecified: Secondary | ICD-10-CM | POA: Diagnosis not present

## 2020-11-06 DIAGNOSIS — I330 Acute and subacute infective endocarditis: Secondary | ICD-10-CM | POA: Diagnosis not present

## 2020-11-06 DIAGNOSIS — I5032 Chronic diastolic (congestive) heart failure: Secondary | ICD-10-CM | POA: Diagnosis not present

## 2020-11-06 DIAGNOSIS — N186 End stage renal disease: Secondary | ICD-10-CM | POA: Diagnosis not present

## 2020-11-06 DIAGNOSIS — D631 Anemia in chronic kidney disease: Secondary | ICD-10-CM | POA: Diagnosis not present

## 2020-11-06 LAB — CBC WITH DIFFERENTIAL/PLATELET
Abs Immature Granulocytes: 0.16 10*3/uL — ABNORMAL HIGH (ref 0.00–0.07)
Basophils Absolute: 0.1 10*3/uL (ref 0.0–0.1)
Basophils Relative: 0 %
Eosinophils Absolute: 0.6 10*3/uL — ABNORMAL HIGH (ref 0.0–0.5)
Eosinophils Relative: 4 %
HCT: 23.7 % — ABNORMAL LOW (ref 39.0–52.0)
Hemoglobin: 7.2 g/dL — ABNORMAL LOW (ref 13.0–17.0)
Immature Granulocytes: 1 %
Lymphocytes Relative: 10 %
Lymphs Abs: 1.5 10*3/uL (ref 0.7–4.0)
MCH: 27.4 pg (ref 26.0–34.0)
MCHC: 30.4 g/dL (ref 30.0–36.0)
MCV: 90.1 fL (ref 80.0–100.0)
Monocytes Absolute: 2.7 10*3/uL — ABNORMAL HIGH (ref 0.1–1.0)
Monocytes Relative: 18 %
Neutro Abs: 10 10*3/uL — ABNORMAL HIGH (ref 1.7–7.7)
Neutrophils Relative %: 67 %
Platelets: 462 10*3/uL — ABNORMAL HIGH (ref 150–400)
RBC: 2.63 MIL/uL — ABNORMAL LOW (ref 4.22–5.81)
RDW: 18.1 % — ABNORMAL HIGH (ref 11.5–15.5)
WBC: 15 10*3/uL — ABNORMAL HIGH (ref 4.0–10.5)
nRBC: 0 % (ref 0.0–0.2)

## 2020-11-06 LAB — COMPREHENSIVE METABOLIC PANEL
ALT: 8 U/L (ref 0–44)
AST: 24 U/L (ref 15–41)
Albumin: 1.9 g/dL — ABNORMAL LOW (ref 3.5–5.0)
Alkaline Phosphatase: 53 U/L (ref 38–126)
Anion gap: 9 (ref 5–15)
BUN: 21 mg/dL — ABNORMAL HIGH (ref 6–20)
CO2: 28 mmol/L (ref 22–32)
Calcium: 7.7 mg/dL — ABNORMAL LOW (ref 8.9–10.3)
Chloride: 93 mmol/L — ABNORMAL LOW (ref 98–111)
Creatinine, Ser: 4.53 mg/dL — ABNORMAL HIGH (ref 0.61–1.24)
GFR, Estimated: 15 mL/min — ABNORMAL LOW (ref >60)
Glucose, Bld: 125 mg/dL — ABNORMAL HIGH (ref 70–99)
Potassium: 4 mmol/L (ref 3.5–5.1)
Sodium: 130 mmol/L — ABNORMAL LOW (ref 135–145)
Total Bilirubin: 0.6 mg/dL (ref 0.3–1.2)
Total Protein: 7.2 g/dL (ref 6.5–8.1)

## 2020-11-06 LAB — PROCALCITONIN: Procalcitonin: 0.93 ng/mL

## 2020-11-06 LAB — PHOSPHORUS: Phosphorus: 3.2 mg/dL (ref 2.5–4.6)

## 2020-11-06 NOTE — Progress Notes (Signed)
[facility] ROUNDING NOTE   Subjective:   Brief history: 50 year old gentleman with history end-stage renal disease Monday Wednesday Friday dialysis, history of chronic diastolic heart failure with ejection fraction of 55-60%.  History of medical noncompliance, history of liver cirrhosis with hepatitis C, history of alcohol use.  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  Underwent successful minimally invasive mitral valve replacement 10/14/2020.  Was found to have swelling in right leg was evaluated orthopedic with debridement complicated by abscess and osteomyelitis involving tibia.  This area was excised by orthopedic surgery.  Dr. Sharol Given.  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    His last dialysis treatment was 11/05/2020 with 2 L removed   Blood pressure 154/84 pulse 100 temperature 9.3 O2 sats 90% room air  Sodium 130 potassium 4 chloride 93 CO2 28 BUN 21 creatinine 4.5 glucose 125 phosphorus 3.2 albumin 1.9 hemoglobin 7.2      Objective:  Vital signs in last 24 hours:  Temp:  [98.1 F (36.7 C)-101.8 F (38.8 C)] 99.3 F (37.4 C) (04/02 0758) Pulse Rate:  [97-108] 103 (04/02 0758) Resp:  [16-42] 18 (04/02 0758) BP: (106-154)/(78-100) 154/84 (04/02 0758) SpO2:  [92 %-98 %] 92 % (04/02 0758) Weight:  [72.2 kg-73 kg] 73 kg (04/02 0620)  Weight change: -0.6 kg Filed Weights   11/05/20 1204 11/06/20 0600 11/06/20 0620  Weight: 72.2 kg 73 kg 73 kg    Intake/Output: I/O last 3 completed shifts: In: 120 [P.O.:120] Out: 2045 [Drains:25; Other:2020]   Intake/Output this shift:  No intake/output data recorded.  General: Alert dialysis 80 Heart: RRR, soft 1/6 SEM, no rub or gallop Lungs: CTA nonlabored breathing Abdomen: BS positive, soft NTND Extremities: No pedal edema , R AKA wound VAC present  HD access =patent on HD LUA AVG    Basic Metabolic Panel: Recent Labs  Lab 11/02/20 0100 11/03/20 0310  11/04/20 0129 11/05/20 0204 11/06/20 0150  NA 132* 128* 131* 130* 130*  K 3.8 3.4* 3.5 3.8 4.0  CL 95* 90* 95* 93* 93*  CO2 '26 24 26 25 28  '$ GLUCOSE 108* 94 124* 99 125*  BUN 17 32* 19 31* 21*  CREATININE 4.63* 6.79* 4.55* 6.63* 4.53*  CALCIUM 8.3* 8.5* 7.7* 8.1* 7.7*  PHOS 3.3 4.2 3.0 4.4 3.2    Liver Function Tests: Recent Labs  Lab 11/02/20 0100 11/03/20 0310 11/04/20 0129 11/05/20 0204 11/06/20 0150  AST  --   --   --   --  24  ALT  --   --   --   --  8  ALKPHOS  --   --   --   --  53  BILITOT  --   --   --   --  0.6  PROT  --   --   --   --  7.2  ALBUMIN 2.1* 2.2* 1.9* 2.0* 1.9*   No results for input(s): LIPASE, AMYLASE in the last 168 hours. No results for input(s): AMMONIA in the last 168 hours.  CBC: Recent Labs  Lab 11/02/20 0100 11/03/20 0310 11/04/20 0129 11/05/20 0204 11/06/20 0150  WBC 17.3* 18.1* 14.1* 15.1* 15.0*  NEUTROABS 13.4* 13.7* 10.1* 10.8* 10.0*  HGB 7.6* 7.6* 7.1* 7.4* 7.2*  HCT 25.0* 25.5* 23.5* 24.1* 23.7*  MCV 89.3 89.2 90.4 88.3 90.1  PLT 361 410* 390 431* 462*    Cardiac Enzymes: No results for input(s): CKTOTAL, CKMB, CKMBINDEX, TROPONINI in the last 168 hours.  BNP: Invalid input(s): POCBNP  CBG: Recent Labs  Lab 11/03/20 1206  GLUCAP 83    Microbiology: Results for orders placed or performed during the hospital encounter of 09/27/20  Blood Culture (routine x 2)     Status: Abnormal   Collection Time: 09/27/20  2:16 PM   Specimen: BLOOD RIGHT HAND  Result Value Ref Range Status   Specimen Description BLOOD RIGHT HAND  Final   Special Requests   Final    BOTTLES DRAWN AEROBIC AND ANAEROBIC Blood Culture results may not be optimal due to an inadequate volume of blood received in culture bottles Performed at [facility], [place] [address], [place], [place] [address]    Culture  Setup Time   Final    GRAM POSITIVE COCCI IN CLUSTERS IN BOTH AEROBIC AND ANAEROBIC BOTTLES    Culture METHICILLIN RESISTANT  STAPHYLOCOCCUS AUREUS (A)  Final   Report Status 09/30/2020 FINAL  Final   Organism ID, Bacteria METHICILLIN RESISTANT STAPHYLOCOCCUS AUREUS  Final      Susceptibility   Methicillin resistant staphylococcus aureus - MIC*    CIPROFLOXACIN >=8 RESISTANT Resistant     ERYTHROMYCIN >=8 RESISTANT Resistant     GENTAMICIN <=0.5 SENSITIVE Sensitive     OXACILLIN >=4 RESISTANT Resistant     TETRACYCLINE <=1 SENSITIVE Sensitive     VANCOMYCIN <=0.5 SENSITIVE Sensitive     TRIMETH/SULFA <=10 SENSITIVE Sensitive     CLINDAMYCIN <=0.25 SENSITIVE Sensitive     RIFAMPIN <=0.5 SENSITIVE Sensitive     Inducible Clindamycin NEGATIVE Sensitive     * METHICILLIN RESISTANT STAPHYLOCOCCUS AUREUS  Resp Panel by RT-PCR (Flu A&B, Covid) Nasopharyngeal Swab     Status: None   Collection Time: 09/27/20  2:16 PM   Specimen: Nasopharyngeal Swab; Nasopharyngeal(NP) swabs in vial transport medium  Result Value Ref Range Status   SARS Coronavirus 2 by RT PCR NEGATIVE NEGATIVE Final    Comment: (NOTE) SARS-CoV-2 target nucleic acids are NOT DETECTED.  The SARS-CoV-2 RNA is generally detectable in upper respiratory specimens during the acute phase of infection. The lowest concentration of SARS-CoV-2 viral copies this assay can detect is 138 copies/mL. A negative result does not preclude SARS-Cov-2 infection and should not be used as the sole basis for treatment or other patient management decisions. A negative result may occur with  improper specimen collection/handling, submission of specimen other than nasopharyngeal swab, presence of viral mutation(s) within the areas targeted by this assay, and inadequate number of viral copies(<138 copies/mL). A negative result must be combined with clinical observations, patient history, and epidemiological information. The expected result is Negative.  Fact Sheet for Patients:  EntrepreneurPulse.com.au  Fact Sheet for Healthcare Providers:   IncredibleEmployment.be  This test is no t yet approved or cleared by the [place] FDA and  has been authorized for detection and/or diagnosis of SARS-CoV-2 by FDA under an Emergency Use Authorization (EUA). This EUA will remain  in effect (meaning this test can be used) for the duration of the COVID-19 declaration under Section 564(b)(1) of the Act, 21 U.S.C.section 360bbb-3(b)(1), unless the authorization is terminated  or revoked sooner.       Influenza A by PCR NEGATIVE NEGATIVE Final   Influenza B by PCR NEGATIVE NEGATIVE Final    Comment: (NOTE) The Xpert Xpress SARS-CoV-2/FLU/RSV plus assay is intended as an aid in the diagnosis of influenza from Nasopharyngeal swab specimens and should not be used as a sole basis for treatment. Nasal washings and aspirates are unacceptable  for Xpert Xpress SARS-CoV-2/FLU/RSV testing.  Fact Sheet for Patients: EntrepreneurPulse.com.au  Fact Sheet for Healthcare Providers: IncredibleEmployment.be  This test is not yet approved or cleared by the [place] FDA and has been authorized for detection and/or diagnosis of SARS-CoV-2 by FDA under an Emergency Use Authorization (EUA). This EUA will remain in effect (meaning this test can be used) for the duration of the COVID-19 declaration under Section 564(b)(1) of the Act, 21 U.S.C. section 360bbb-3(b)(1), unless the authorization is terminated or revoked.  Performed at [facility], [place] [address] [place], [place] [address]   Blood Culture ID Panel (Reflexed)     Status: Abnormal   Collection Time: 09/27/20  2:16 PM  Result Value Ref Range Status   Enterococcus faecalis NOT DETECTED NOT DETECTED Final   Enterococcus Faecium NOT DETECTED NOT DETECTED Final   Listeria monocytogenes NOT DETECTED NOT DETECTED Final   Staphylococcus species DETECTED (A) NOT DETECTED Final    Comment: CRITICAL RESULT CALLED TO, READ BACK BY  AND VERIFIED WITH: C,PIERCE PHARMD '@1009'$  09/28/20 EB    Staphylococcus aureus (BCID) DETECTED (A) NOT DETECTED Final    Comment: Methicillin (oxacillin)-resistant Staphylococcus aureus (MRSA). MRSA is predictably resistant to beta-lactam antibiotics (except ceftaroline). Preferred therapy is vancomycin unless clinically contraindicated. Patient requires contact precautions if  hospitalized. CRITICAL RESULT CALLED TO, READ BACK BY AND VERIFIED WITH: C,PIERCE PHARMD '@1009'$  09/28/20 EB    Staphylococcus epidermidis NOT DETECTED NOT DETECTED Final   Staphylococcus lugdunensis NOT DETECTED NOT DETECTED Final   Streptococcus species NOT DETECTED NOT DETECTED Final   Streptococcus agalactiae NOT DETECTED NOT DETECTED Final   Streptococcus pneumoniae NOT DETECTED NOT DETECTED Final   Streptococcus pyogenes NOT DETECTED NOT DETECTED Final   A.calcoaceticus-baumannii NOT DETECTED NOT DETECTED Final   Bacteroides fragilis NOT DETECTED NOT DETECTED Final   Enterobacterales NOT DETECTED NOT DETECTED Final   Enterobacter cloacae complex NOT DETECTED NOT DETECTED Final   Escherichia coli NOT DETECTED NOT DETECTED Final   Klebsiella aerogenes NOT DETECTED NOT DETECTED Final   Klebsiella oxytoca NOT DETECTED NOT DETECTED Final   Klebsiella pneumoniae NOT DETECTED NOT DETECTED Final   Proteus species NOT DETECTED NOT DETECTED Final   Salmonella species NOT DETECTED NOT DETECTED Final   Serratia marcescens NOT DETECTED NOT DETECTED Final   Haemophilus influenzae NOT DETECTED NOT DETECTED Final   Neisseria meningitidis NOT DETECTED NOT DETECTED Final   Pseudomonas aeruginosa NOT DETECTED NOT DETECTED Final   Stenotrophomonas maltophilia NOT DETECTED NOT DETECTED Final   Candida albicans NOT DETECTED NOT DETECTED Final   Candida auris NOT DETECTED NOT DETECTED Final   Candida glabrata NOT DETECTED NOT DETECTED Final   Candida krusei NOT DETECTED NOT DETECTED Final   Candida parapsilosis NOT DETECTED NOT  DETECTED Final   Candida tropicalis NOT DETECTED NOT DETECTED Final   Cryptococcus neoformans/gattii NOT DETECTED NOT DETECTED Final   Meth resistant mecA/C and MREJ DETECTED (A) NOT DETECTED Final    Comment: CRITICAL RESULT CALLED TO, READ BACK BY AND VERIFIED WITH: C,PIERCE PHARMD '@1009'$  09/28/20 EB Performed at [facility], [address] [place], [place] [address]   Blood Culture (routine x 2)     Status: Abnormal   Collection Time: 09/27/20  2:29 PM   Specimen: BLOOD  Result Value Ref Range Status   Specimen Description BLOOD SITE NOT SPECIFIED  Final   Special Requests   Final    BOTTLES DRAWN AEROBIC AND ANAEROBIC Blood Culture results may not be  optimal due to an inadequate volume of blood received in culture bottles   Culture  Setup Time   Final    GRAM POSITIVE COCCI IN CLUSTERS ANAEROBIC BOTTLE ONLY CRITICAL VALUE NOTED.  VALUE IS CONSISTENT WITH PREVIOUSLY REPORTED AND CALLED VALUE.    Culture (A)  Final    STAPHYLOCOCCUS AUREUS SUSCEPTIBILITIES PERFORMED ON PREVIOUS CULTURE WITHIN THE LAST 5 DAYS. Performed at [facility], [place] [address] [place], [place] [address]    Report Status 10/01/2020 FINAL  Final  Culture, blood (routine x 2)     Status: None   Collection Time: 09/30/20  6:04 AM   Specimen: BLOOD RIGHT FOREARM  Result Value Ref Range Status   Specimen Description BLOOD RIGHT FOREARM  Final   Special Requests   Final    BOTTLES DRAWN AEROBIC AND ANAEROBIC Blood Culture adequate volume   Culture   Final    NO GROWTH 5 DAYS Performed at [facility], [place] [address], [place], [place] [address]    Report Status 10/05/2020 FINAL  Final  Culture, blood (routine x 2)     Status: Abnormal   Collection Time: 09/30/20  6:08 AM   Specimen: BLOOD RIGHT HAND  Result Value Ref Range Status   Specimen Description BLOOD RIGHT HAND  Final   Special Requests   Final    BOTTLES DRAWN AEROBIC AND ANAEROBIC Blood Culture results may not be optimal due  to an inadequate volume of blood received in culture bottles   Culture  Setup Time   Final    GRAM POSITIVE COCCI IN CLUSTERS ANAEROBIC BOTTLE ONLY CRITICAL VALUE NOTED.  VALUE IS CONSISTENT WITH PREVIOUSLY REPORTED AND CALLED VALUE.    Culture (A)  Final    STAPHYLOCOCCUS AUREUS SUSCEPTIBILITIES PERFORMED ON PREVIOUS CULTURE WITHIN THE LAST 5 DAYS. Performed at [facility], [place] [address] [place], [place] [address]    Report Status 10/03/2020 FINAL  Final  Culture, blood (routine x 2)     Status: None   Collection Time: 10/03/20  2:42 AM   Specimen: BLOOD  Result Value Ref Range Status   Specimen Description BLOOD LEFT ARM  Final   Special Requests   Final    BOTTLES DRAWN AEROBIC AND ANAEROBIC Blood Culture results may not be optimal due to an inadequate volume of blood received in culture bottles   Culture   Final    NO GROWTH 5 DAYS Performed at [facility], [place] [address] [place], [place] [address]    Report Status 10/08/2020 FINAL  Final  Culture, blood (routine x 2)     Status: None   Collection Time: 10/03/20  2:42 AM   Specimen: BLOOD  Result Value Ref Range Status   Specimen Description BLOOD LEFT HAND  Final   Special Requests   Final    BOTTLES DRAWN AEROBIC ONLY Blood Culture results may not be optimal due to an inadequate volume of blood received in culture bottles   Culture   Final    NO GROWTH 5 DAYS Performed at [facility], [place] [address] [place], [place] [address]    Report Status 10/08/2020 FINAL  Final  Surgical pcr screen     Status: Abnormal   Collection Time: 10/11/20  6:44 PM   Specimen: Nasal Mucosa; Nasal Swab  Result Value Ref Range Status   MRSA, PCR POSITIVE (A) NEGATIVE Final    Comment: RESULT CALLED TO, READ BACK BY AND VERIFIED  WITH: Candy Sledge RN 10/11/20 2339 JDW    Staphylococcus aureus POSITIVE (A) NEGATIVE Final    Comment: (NOTE) The Xpert SA Assay (FDA approved for NASAL specimens in patients 57 years of  age and older), is one component of a comprehensive surveillance program. It is not intended to diagnose infection nor to guide or monitor treatment. Performed at [facility], [place] [address] [place], [place] [address]   Culture, blood (routine x 2)     Status: None   Collection Time: 10/13/20 10:43 PM   Specimen: BLOOD  Result Value Ref Range Status   Specimen Description BLOOD RIGHT THUMB  Final   Special Requests   Final    BOTTLES DRAWN AEROBIC AND ANAEROBIC Blood Culture adequate volume   Culture   Final    NO GROWTH 5 DAYS Performed at [facility], [place] [address], [place], [place] [address]    Report Status 10/19/2020 FINAL  Final  Culture, blood (routine x 2)     Status: None   Collection Time: 10/13/20 10:43 PM   Specimen: BLOOD RIGHT HAND  Result Value Ref Range Status   Specimen Description BLOOD RIGHT HAND  Final   Special Requests   Final    BOTTLES DRAWN AEROBIC AND ANAEROBIC Blood Culture adequate volume   Culture   Final    NO GROWTH 5 DAYS Performed at [facility], [place] [address], [place], [place] [address]    Report Status 10/19/2020 FINAL  Final  Aerobic/Anaerobic Culture w Gram Stain (surgical/deep wound)     Status: None   Collection Time: 10/14/20 10:38 AM   Specimen: Soft Tissue, Other  Result Value Ref Range Status   Specimen Description TISSUE MITRAL VALVE VEGETATION SPEC A  Final   Special Requests MITRAL VALVE VEGETATION  Final   Gram Stain   Final    WBC PRESENT,BOTH PMN AND MONONUCLEAR NO ORGANISMS SEEN    Culture   Final    No growth aerobically or anaerobically. Performed at [facility], [place] [address] [place], [place] [address]    Report Status 10/19/2020 FINAL  Final  Aerobic/Anaerobic Culture w Gram Stain (surgical/deep wound)     Status: None   Collection Time: 10/20/20  4:12 PM   Specimen: Soft Tissue, Other  Result Value Ref Range Status   Specimen Description TISSUE  Final   Special Requests RIGHT LEG  ABSC SPEC A  Final   Gram Stain   Final    RARE WBC PRESENT,BOTH PMN AND MONONUCLEAR NO ORGANISMS SEEN    Culture   Final    FEW METHICILLIN RESISTANT STAPHYLOCOCCUS AUREUS CRITICAL RESULT CALLED TO, READ BACK BY AND VERIFIED WITH: RN N.CROSFON AT [id number] ON 10/22/2020 BY T.SAAD NO ANAEROBES ISOLATED Performed at [facility], [place] [address], [place], [place] [address]    Report Status 10/26/2020 FINAL  Final   Organism ID, Bacteria METHICILLIN RESISTANT STAPHYLOCOCCUS AUREUS  Final      Susceptibility   Methicillin resistant staphylococcus aureus - MIC*    CIPROFLOXACIN >=8 RESISTANT Resistant     ERYTHROMYCIN >=8 RESISTANT Resistant     GENTAMICIN <=0.5 SENSITIVE Sensitive     OXACILLIN >=4 RESISTANT Resistant     TETRACYCLINE <=1 SENSITIVE Sensitive     VANCOMYCIN 1 SENSITIVE Sensitive     TRIMETH/SULFA <=10 SENSITIVE Sensitive     CLINDAMYCIN <=0.25 SENSITIVE Sensitive     RIFAMPIN <=0.5 SENSITIVE Sensitive     Inducible  Clindamycin NEGATIVE Sensitive     * FEW METHICILLIN RESISTANT STAPHYLOCOCCUS AUREUS  Fungus Stain     Status: None   Collection Time: 10/20/20  4:12 PM   Specimen: Soft Tissue, Other  Result Value Ref Range Status   FUNGUS STAIN Final report  Final    Comment: (NOTE) Performed At: [facility] [place], [place] [id number] Rush Farmer MD [id number]    Fungal Source TISSUE  Final    Comment: RIGHT LEG ABSC SPEC A Performed at [facility], [place] [address], [place], [place] [address]   Fungal Stain reflex     Status: None   Collection Time: 10/20/20  4:12 PM  Result Value Ref Range Status   Fungal stain result 1 Comment  Final    Comment: (NOTE) KOH/Calcofluor preparation:  no fungus observed. Performed At: [facility] [place], [place] [id number] Rush Farmer MD [id number]     Coagulation Studies: No results for input(s): LABPROT, INR in the last 72 hours.  Urinalysis: No  results for input(s): COLORURINE, LABSPEC, PHURINE, GLUCOSEU, HGBUR, BILIRUBINUR, KETONESUR, PROTEINUR, UROBILINOGEN, NITRITE, LEUKOCYTESUR in the last 72 hours.  Invalid input(s): APPERANCEUR    Imaging: DG CHEST PORT 1 VIEW  Result Date: 11/05/2020 CLINICAL DATA:  Shortness of breath, chest pain. EXAM: PORTABLE CHEST 1 VIEW COMPARISON:  October 18, 2020. FINDINGS: Stable cardiomegaly. No pneumothorax is noted. Small bilateral pleural effusions are noted. Mild bibasilar subsegmental atelectasis is noted. Right upper lobe opacity is noted concerning for atelectasis or possibly scarring. 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 IMPRESSION: Mild bibasilar subsegmental atelectasis is noted with small bilateral pleural effusions. Right upper lobe opacity is again noted concerning for atelectasis or possibly scarring. Electronically Signed   By: Marijo Conception M.D.   On: 11/05/2020 15:50     Medications:   . sodium chloride    . sodium chloride 10 mL/hr at 10/29/20 1351  . sodium chloride    . methocarbamol (ROBAXIN) IV    . vancomycin Stopped (11/05/20 1125)   . (feeding supplement) PROSource Plus  30 mL Oral BID BM  . aspirin EC  325 mg Oral Daily  . calcium acetate  667 mg Oral TID WC  . Chlorhexidine Gluconate Cloth  6 each Topical Q0600  . Chlorhexidine Gluconate Cloth  6 each Topical Q0600  . cinacalcet  60 mg Oral Q breakfast  . cloNIDine  0.3 mg Transdermal Weekly  . [START ON 11/08/2020] darbepoetin (ARANESP) injection - DIALYSIS  100 mcg Intravenous Q Mon-HD  . docusate sodium  100 mg Oral BID  . enoxaparin (LOVENOX) injection  30 mg Subcutaneous Q24H  . feeding supplement (NEPRO CARB STEADY)  237 mL Oral TID BM  . influenza vac split quadrivalent PF  0.5 mL Intramuscular Tomorrow-1000  . metoprolol tartrate  50 mg Oral BID  . multivitamin  1 tablet Oral QHS  . oxyCODONE  15 mg Oral Q12H  . oxymetazoline  1 spray Each Nare BID  . pantoprazole  40 mg Oral Daily  .  pregabalin  75 mg Oral QHS  . senna-docusate  1 tablet Oral BID  . sodium chloride flush  3 mL Intravenous Q12H   sodium chloride, acetaminophen, bisacodyl, camphor-menthol, diphenhydrAMINE, HYDROmorphone (DILAUDID) injection, HYDROmorphone, magnesium citrate, methocarbamol **OR** methocarbamol (ROBAXIN) IV, metoCLOPramide **OR** metoCLOPramide (REGLAN) injection, ondansetron **OR** ondansetron (ZOFRAN) IV, oxyCODONE, polyethylene glycol, sodium chloride flush  Assessment/ Plan:  OP HD:MWF South  4h 350/500 new edw  here 47- 75kg (was82.5kg)2/2.5 bath LUA AVGHep 5000 -Aranesp 21mgIVq 2wks - last 09/27/20 (Verified order) -Hectorol352m IV qHD    Problem/Plan: 1. Anterior shinosteomyelitis:status post I&D per orthopedics. Pathology showedstaph aureus in the tibia. 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 SP min invasive MVR(bioprosthetic)replacement - on 3/10. Doing well from cardiac standpoint 3. 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 Underwent tooth extraction3/8/22.PerID to get 6 wks IV vanc w/ end date of 11/25/20.Next dialysis will be 11/08/2020 4. ESRD - usual HDMWF. 5. HTN/volume -appears stable at this point. 6. Anemiaof CKD-HGB 7.6 range. S/P 2 units of PRBCs 3/10.Continue darbepoetin 100 mcg next dose will be 11/08/2020 ,no IV Fe due to osteomyelitis.  7. Secondary Hyperparathyroidism -Calcium and phosphorus controlled. Continue sensipar, binders, and hectorol 8. Hx Hep C /liver cirrhosis.    LOS: 40[place]'@TODAY'D'@10'$ :08 AM

## 2020-11-06 NOTE — Progress Notes (Signed)
Mobility Specialist - Progress Note   11/06/20 1133  Mobility  Activity Ambulated in hall;Ambulated to bathroom  Level of Assistance Contact guard assist, steadying assist  Assistive Device Front wheel walker  Distance Ambulated (ft) 120 ft (90 ft x 1, 30 ft x 1)  Mobility Response Tolerated well  Mobility performed by Mobility specialist  $Mobility charge 1 Mobility   Pre-mobility: 100 HR During mobility: 116 HR Post-mobility: 101 HR  Pt c/o R LE and L shoulder pain w/ ambulation. Pt to bathroom after walk, then sitting up on edge of bed.   Pricilla Handler Mobility Specialist Mobility Specialist Phone: [phone]

## 2020-11-06 NOTE — Progress Notes (Signed)
Pharmacy Antibiotic Note  Patrick Anthony is a 50 y.o. male admitted on 09/27/2020 with fever, chills, and generalized weakness.  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  Continues on Vancomycin per pharmacy.  Pt has ESRD with HD MWF.   Plans are for 6 weeks of antibiotics with day 1 being post-op day 1 from valve surgery per 3/7 ID note 3/10 > (4/21). 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 Last HD 4/1, has remained on schedule. Tolerating 3.5-4 hours at BFR 350-400. Most recent Vancomycin random therapeutic at 18 mcg/ml (goal 15-25 mcg/ml).  Continue with vancomycin '750mg'$  IV Q-HD  Plan: -Continue vancomycin 750 mg IV qHD -Continue following HD schedule/duration  Height: 6' 0.01" (182.9 cm) Weight: 73 kg (160 lb 15 oz) IBW/kg (Calculated) : 77.62  Temp (24hrs), Avg:99.2 F (37.3 C), Min:98.1 F (36.7 C), Max:101.8 F (38.8 C)  Recent Labs  Lab 11/02/20 0100 11/03/20 0310 11/04/20 0129 11/05/20 0204 11/06/20 0150  WBC 17.3* 18.1* 14.1* 15.1* 15.0*  CREATININE 4.63* 6.79* 4.55* 6.63* 4.53*  VANCORANDOM  --  18  --   --   --     Estimated Creatinine Clearance: 20.4 mL/min (A) (by C-G formula based on SCr of 4.53 mg/dL (H)).    No Known Allergies  Antimicrobials this admission: Vanc 2/21>> Cefepime 2/21>>2/22 Flagyl 2/21>>2/22 (2 doses given)  Dose adjustments this admission: 2/28 Pre-HD VR 41 - estimate post HD level 27 Dose held 2/28 and reduced to '750mg'$  IV qHD starting 3/2 3/7 pre HD VR= 29; change vanc to '500mg'$  w/ HD 3/13 Post-HD VR 29 - no additional dose (did get additional dose of vanc around time of surgery) 3/16 Pre-HD VR 20 - continue with 500 IV qHD 3/17 Pre-HD VR 15 - incr to 750 IV qHD 3/30 VR 18- continue with '750mg'$  IV qHD  Microbiology results: 2/21 blood x 2: MRSA, sens Vanc MIC < 0.5, Septra, TCN, gent MIC < 0.5, Clinda 2/22 BCID: MRSA  2/21 COVID and flu: negative 2/21 HIV: non-reactive 2/24 blood x 2: 1/2 SA 2/27 blood x  2: neg  Thank you for involving pharmacy in this patient's care.  [name], PharmD PGY1 Pharmacy Resident 11/06/2020 10:20 AM  **Pharmacist phone directory can be found on [email or web address] listed under [place]**

## 2020-11-06 NOTE — Progress Notes (Signed)
[facility] PROGRESS NOTE  Patrick Anthony [id number] DOB: 07-20-1971 DOA: 09/27/2020 PCP: [name], MD  Status: Remains inpatient appropriate because:Ongoing active pain requiring inpatient pain management, Unsafe d/c plan and IV treatments appropriate due to intensity of illness or inability to take PO   Dispo: The patient is from: Home              Anticipated d/c is to: SNF              Patient currently is not medically stable to d/c. 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  Seen by ID managed with IV antibiotic, CT surgery consult salt was obtained subsequently underwent minimally invasive mitral valve replacement on 10/14/2020 and was managed under CT surgery service and subsequently transferred to [facility] on 3/16.  Patient was followed by ID, orthopedics.  He was found to have swelling on the right leg and seen by orthopedic underwent debridement on the right leg and treated for large complicated abscess with osteomyelitis involving his tibia with 7 by X.3 centimeter area of bone that was excised. Patient was seen by Dr. Sharol Given orthopedic ID and the right above-the-knee amputation has been recommended moving forward to manage this infection. Patient has been told by multiple providers multiple times the need for  the above-the-knee amputation to avoid seeding of his new valve and his graft but has been reluctant and he is aware about dying from staph aureus bacteremia and sepsis. 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 He has had significant anemia in the setting of surgery and wound VAC with bloody drainage  Subjective: Patient seen and examined.  He has no complaint.  Denied any active pain.  Objective: Vitals:   11/06/20 0621 11/06/20 0758  BP:  (!) 154/84  Pulse:  (!) 103  Resp:  18  Temp: 98.9 F (37.2 C) 99.3 F (37.4 C)  SpO2:  92%    Intake/Output Summary (Last 24 hours) at 11/06/2020 1334 Last data filed at 11/06/2020 0800 Gross per 24 hour  Intake 240 ml  Output 25 ml  Net 215 ml   Filed Weights   11/05/20 1204 11/06/20 0600 11/06/20 0620  Weight: 72.2 kg 73 kg 73 kg    Exam:  General exam: Appears calm and comfortable  Respiratory system: Clear to auscultation. Respiratory effort normal. Cardiovascular system: S1 & S2 heard, RRR. No JVD, murmurs, rubs, gallops or clicks. No pedal edema. Gastrointestinal system: Abdomen is nondistended, soft and nontender. No organomegaly or masses felt. Normal bowel sounds heard. Central nervous system: Alert and oriented. No focal neurological deficits. Extremities: Right AKA with wound VAC in place. Skin: No rashes, lesions or ulcers.  Psychiatry: Judgement and insight appear poor.  Assessment/Plan: Acute problems: MRSA bacteremia/MRSA mitral valve endocarditis with PFO with septic  emboli to the brain and LUL cavitary lung mass  -S/p extraction of multiple teeth -Status post mitral valve replacement (bioprosthetic) and closure of the PFO 3/10 Cont vancomycin w/ HD- duration 10/14/20 > 11/25/2020 as per ID. Repeat blood cultures from 10/13/20 are negative. -WBCs trending downward from a peak of 21.8 on 3/26 and as of 3/31  [id number]  Large complicated abscess with osteomyelitis involving his right tibia- S/P Rt AKA 3/25 -Followed by ID, vascular, CT surgery and Dr. Sharol Given  -Patient initially was very reluctant to proceed with AKA but given clinical status eventually agreed -Continue Lyrica 75 mg at at bedtime -Continue OxyContin to 15 mg every 12 hours.  -Plan to eventually wean and discontinue IV Dilaudid then wean and discontinue oral oxycodone as OxyContin dose titrated upward.  -Eventual goal is to eventually wean and discontinue OxyContin   Status post right AKA continue pain management and wound care as above, wound VAC in place-with bloody drainage likely contributing to anemia.  Incision (Closed) 10/14/20 Groin Right (Active)  Date First Assessed/Time First Assessed: 10/14/20 1132   Location: Groin  Location Orientation: Right    Assessments 10/16/2020  8:00 PM 11/05/2020  8:00 PM  Dressing Type None None  Site / Wound Assessment Clean;Dry Clean;Dry  Margins Attached edges (approximated) Attached edges (approximated)  Closure -- Approximated  Drainage Amount None None  Treatment Other (Comment) --     No Linked orders to display     Incision (Closed) 10/14/20 Chest Right (Active)  Date First Assessed/Time First Assessed: 10/14/20 1134   Location: Chest  Location Orientation: Right    Assessments 10/14/2020  2:15 PM 11/05/2020  8:00 PM  Dressing Type Silver hydrofiber None  Dressing Clean;Dry;Intact --  Dressing Change Frequency Other (Comment) --  Site / Wound Assessment Dressing in place / Unable to assess Clean;Dry  Margins -- Attached edges (approximated)  Closure -- Skin glue  Drainage Amount -- None  Treatment Other (Comment) --     No Linked orders to display     Incision (Closed) 10/20/20 Leg Right (Active)  Date First Assessed/Time First Assessed: 10/20/20 1656   Location: Leg  Location Orientation: Right    Assessments 10/21/2020  8:30 AM 11/03/2020  7:15 AM  Dressing Type Compression wrap  Negative pressure wound therapy  Dressing Clean;Dry;Intact Clean;Dry;Intact  Site / Wound Assessment Dressing in place / Unable to assess --  Drainage Amount -- Minimal  Drainage Description -- Sanguineous     No Linked orders to display     Incision (Closed) 10/29/20 Knee Right (Active)  Date First Assessed/Time First Assessed: 10/29/20 1341   Location: Knee  Location Orientation: Right    Assessments 10/29/2020  3:00 PM 11/06/2020  8:37 AM  Dressing Type Other (Comment) Negative pressure wound therapy  Dressing Clean;Dry;Intact Clean;Dry  Site / Wound Assessment Dressing in place / Unable to assess Dressing in place / Unable to assess  Drainage Amount None --     No Linked orders to display     Negative Pressure Wound Therapy Knee Right (Active)  Placement Date/Time: 10/29/20 1439   Wound Type: Surgical (Open wound)  Location: Knee  Location Orientation: Right    Assessments 10/29/2020  3:00 PM 11/06/2020  8:37 AM  Site / Wound Assessment Dressing in place / Unable to assess Dressing in place / Unable to assess  Target Pressure (mmHg) 125 --  Output (mL) 0 mL --     No Linked orders to display    ESRD and  HD MWF:  -Appreciate nephrology assistance  Acute blood loss anemia in the setting of surgery, and wound VAC bloody draiange/Anemia of chronic kidney disease -S/p 3 units PRBC -Continue aranesp and dose was increased.   -No iron due to acute infection.   -Hemoglobin 7.2.  Monitor daily.  Transfuse if less than 7.  Hypertension  -Continue metoprolol and clonidine patch as well as regular hemodialysis.  Nutrition Status: Nutrition Problem: Increased nutrient needs Etiology: chronic illness (ESRD on HD) Signs/Symptoms: estimated needs Interventions: Nepro shake,Refer to RD note for recommendations Estimated body mass index is 21.82 kg/m as calculated from the following:   Height as of this encounter: 6' 0.01" (1.829 m).   Weight as of this encounter: 73 kg.   Other  problems: Hyperkalemia:  -Resolved  Secondary hyperparathyroidism -continue Sensipar binders and Hectorol per nephrology.   History of hepatitis C/liver cirrhosis -outpatient follow-up with ID. -Monitor LFTs intermittently.    Hyponatremia  -Not unexpected in dialysis patient and can be managed with dialysate per nephrology   Nasal bleeding  -3/20 resolved.  Data Reviewed: Basic Metabolic Panel: Recent Labs  Lab 11/02/20 0100 11/03/20 0310 11/04/20 0129 11/05/20 0204 11/06/20 0150  NA 132* 128* 131* 130* 130*  K 3.8 3.4* 3.5 3.8 4.0  CL 95* 90* 95* 93* 93*  CO2 '26 24 26 25 28  '$ GLUCOSE 108* 94 124* 99 125*  BUN 17 32* 19 31* 21*  CREATININE 4.63* 6.79* 4.55* 6.63* 4.53*  CALCIUM 8.3* 8.5* 7.7* 8.1* 7.7*  PHOS 3.3 4.2 3.0 4.4 3.2   Liver Function Tests: Recent Labs  Lab 11/02/20 0100 11/03/20 0310 11/04/20 0129 11/05/20 0204 11/06/20 0150  AST  --   --   --   --  24  ALT  --   --   --   --  8  ALKPHOS  --   --   --   --  53  BILITOT  --   --   --   --  0.6  PROT  --   --   --   --  7.2  ALBUMIN 2.1* 2.2* 1.9* 2.0* 1.9*   No results for input(s): LIPASE, AMYLASE in the last 168 hours. No results for input(s): AMMONIA in the last 168 hours. CBC: Recent Labs  Lab 11/02/20 0100 11/03/20 0310 11/04/20 0129 11/05/20 0204 11/06/20 0150  WBC 17.3* 18.1* 14.1* 15.1* 15.0*  NEUTROABS 13.4* 13.7* 10.1* 10.8* 10.0*  HGB 7.6* 7.6* 7.1* 7.4* 7.2*  HCT 25.0* 25.5* 23.5* 24.1* 23.7*  MCV 89.3 89.2 90.4 88.3 90.1  PLT 361 410* 390 431* 462*   Cardiac Enzymes: No results for input(s): CKTOTAL, CKMB, CKMBINDEX, TROPONINI in the last 168 hours. BNP (last 3 results) No results for input(s): BNP in the last 8760 hours.  ProBNP (last 3 results) No results for input(s): PROBNP in the last 8760 hours.  CBG: Recent Labs  Lab 11/03/20 1206  GLUCAP 83    Recent Results (from the past 240 hour(s))  Culture, blood (routine x 2)     Status: None (Preliminary  result)   Collection Time: 11/05/20  4:32 PM   Specimen: BLOOD RIGHT HAND  Result Value Ref Range Status   Specimen Description BLOOD RIGHT HAND  Final   Special Requests   Final    BOTTLES DRAWN AEROBIC ONLY Blood Culture results may not be optimal due to an inadequate volume of blood received in culture bottles   Culture   Final    NO  GROWTH < 24 HOURS Performed at [facility], [place] [address] [place], [place] [address]    Report Status PENDING  Incomplete  Culture, blood (routine x 2)     Status: None (Preliminary result)   Collection Time: 11/05/20  4:45 PM   Specimen: BLOOD RIGHT HAND  Result Value Ref Range Status   Specimen Description BLOOD RIGHT HAND  Final   Special Requests   Final    BOTTLES DRAWN AEROBIC ONLY Blood Culture results may not be optimal due to an inadequate volume of blood received in culture bottles   Culture   Final    NO GROWTH < 24 HOURS Performed at [facility], [place] [address], [place], [place] [address]    Report Status PENDING  Incomplete     Studies: DG CHEST PORT 1 VIEW  Result Date: 11/05/2020 CLINICAL DATA:  Shortness of breath, chest pain. EXAM: PORTABLE CHEST 1 VIEW COMPARISON:  October 18, 2020. FINDINGS: Stable cardiomegaly. No pneumothorax is noted. Small bilateral pleural effusions are noted. Mild bibasilar subsegmental atelectasis is noted. Right upper lobe opacity is noted concerning for atelectasis or possibly scarring. 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 IMPRESSION: Mild bibasilar subsegmental atelectasis is noted with small bilateral pleural effusions. Right upper lobe opacity is again noted concerning for atelectasis or possibly scarring. Electronically Signed   By: Marijo Conception M.D.   On: 11/05/2020 15:50    Scheduled Meds: . (feeding supplement) PROSource Plus  30 mL Oral BID BM  . aspirin EC  325 mg Oral Daily  . calcium acetate  667 mg Oral TID WC  . Chlorhexidine Gluconate Cloth  6 each  Topical Q0600  . Chlorhexidine Gluconate Cloth  6 each Topical Q0600  . cinacalcet  60 mg Oral Q breakfast  . cloNIDine  0.3 mg Transdermal Weekly  . [START ON 11/08/2020] darbepoetin (ARANESP) injection - DIALYSIS  100 mcg Intravenous Q Mon-HD  . docusate sodium  100 mg Oral BID  . enoxaparin (LOVENOX) injection  30 mg Subcutaneous Q24H  . feeding supplement (NEPRO CARB STEADY)  237 mL Oral TID BM  . influenza vac split quadrivalent PF  0.5 mL Intramuscular Tomorrow-1000  . metoprolol tartrate  50 mg Oral BID  . multivitamin  1 tablet Oral QHS  . oxyCODONE  15 mg Oral Q12H  . oxymetazoline  1 spray Each Nare BID  . pantoprazole  40 mg Oral Daily  . pregabalin  75 mg Oral QHS  . senna-docusate  1 tablet Oral BID  . sodium chloride flush  3 mL Intravenous Q12H   Continuous Infusions: . sodium chloride    . sodium chloride 10 mL/hr at 10/29/20 1351  . sodium chloride    . methocarbamol (ROBAXIN) IV    . vancomycin Stopped (11/05/20 1125)    Principal Problem:   Bacterial endocarditis Active Problems:   Chronic combined systolic and diastolic congestive heart failure (HCC)   HTN (hypertension)   Anemia in chronic kidney disease   Hepatitis C   ESRD on dialysis (HCC)   End stage renal disease (HCC)   Chest pain   Sepsis ([place])   Pneumonia due to infectious agent   ADPKD (autosomal dominant polycystic kidney disease)   Sepsis due to pneumonia (HCC)   Lactic acidosis   Cerebral embolism with cerebral infarction   Bacteremia due to Staphylococcus aureus   Mitral regurgitation   Uncontrolled hypertension   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  Patent foramen ovale   Dental caries   Chronic apical periodontitis   Chronic periodontitis   Accretions on teeth   S/P minimally-invasive mitral valve replacement with bioprosthetic valve   S/P mitral valve repair   Abscess   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)   Abscess of right leg   S/P mitral  valve replacement   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)   S/P AKA (above knee amputation), right (HCC)   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   Consultants:  Neurology  Infectious disease  Nephrology  Cardiothoracic surgery  Orthopedic surgery  CIR physician  Palliative medicine  Procedures:  Echocardiogram with bubble study  Continuous EEG  TEE  Intraoperative TEE  Follow-up echocardiogram  Left heart catheterization  Multiple dental extractions with alveoloplasty  Minimally invasive mitral valve replacement using Medtronic Mosaic  Irrigation and debridement of leg  Right above-the-knee amputation  Antibiotics: Anti-infectives (From admission, onward)   Start     Dose/Rate Route Frequency Ordered Stop   10/30/20 1100  vancomycin (VANCOCIN) IVPB 750 mg/150 ml premix        750 mg 150 mL/hr over 60 Minutes Intravenous Once in dialysis 10/30/20 0833 10/30/20 1317   10/29/20 1000  ceFAZolin (ANCEF) IVPB 2g/100 mL premix  Status:  Discontinued        2 g 200 mL/hr over 30 Minutes Intravenous To Short Stay 10/29/20 0730 10/29/20 1634   10/27/20 2015  vancomycin (VANCOCIN) IVPB 750 mg/150 ml premix        750 mg 150 mL/hr over 60 Minutes Intravenous Every M-W-F (Hemodialysis) 10/27/20 2005     10/25/20 0922  vancomycin (VANCOCIN) 500-5 MG/100ML-% IVPB       Note to Pharmacy: Kalman Shan   : cabinet override      10/25/20 0922 10/25/20 0958   10/21/20 0600  ceFAZolin (ANCEF) IVPB 2g/100 mL premix        2 g 200 mL/hr over 30 Minutes Intravenous On call to O.R. 10/20/20 1349 10/21/20 0650   10/20/20 2243  vancomycin (VANCOCIN) 500-5 MG/100ML-% IVPB       Note to Pharmacy: Wallace Cullens   : cabinet override      10/20/20 2243 10/20/20 2243   10/20/20 1437  ceFAZolin (ANCEF) 2-4 GM/100ML-% IVPB       Note to Pharmacy: Ladoris Gene   : cabinet override      10/20/20 1437 10/20/20 1612   10/20/20 1200  vancomycin  (VANCOREADY) IVPB 500 mg/100 mL  Status:  Discontinued        500 mg 100 mL/hr over 60 Minutes Intravenous Every M-W-F (Hemodialysis) 10/20/20 0903 10/27/20 2005   10/18/20 1124  vancomycin variable dose per unstable renal function (pharmacist dosing)  Status:  Discontinued         Does not apply See admin instructions 10/18/20 1124 10/20/20 0903   10/16/20 1600  vancomycin (VANCOREADY) IVPB 500 mg/100 mL  Status:  Discontinued        500 mg 100 mL/hr over 60 Minutes Intravenous Every Sat (Hemodialysis) 10/16/20 1306 10/16/20 1312   10/15/20 1500  vancomycin (VANCOCIN) IVPB 500 mg/100 ml premix  Status:  Discontinued        500 mg 100 mL/hr over 60 Minutes Intravenous Every M-W-F (Hemodialysis) 10/15/20 1357 10/17/20 0942   10/14/20 2130  vancomycin (VANCOCIN) IVPB 1000 mg/200 mL premix  Status:  Discontinued        1,000 mg 200 mL/hr  over 60 Minutes Intravenous  Once 10/14/20 1403 10/14/20 1547   10/14/20 2130  vancomycin (VANCOREADY) IVPB 500 mg/100 mL        500 mg 100 mL/hr over 60 Minutes Intravenous  Once 10/14/20 1547 10/14/20 2222   10/14/20 1730  cefUROXime (ZINACEF) 1.5 g in sodium chloride 0.9 % 100 mL IVPB        1.5 g 200 mL/hr over 30 Minutes Intravenous Every 24 hr x 2 10/14/20 1403 10/15/20 1718   10/14/20 1128  vancomycin (VANCOCIN) 1,000 mg in sodium chloride 0.9 % 1,000 mL irrigation  Status:  Discontinued          As needed 10/14/20 1128 10/14/20 1401   10/14/20 0400  vancomycin (VANCOREADY) IVPB 1250 mg/250 mL        1,250 mg 166.7 mL/hr over 90 Minutes Intravenous To Surgery 10/13/20 1039 10/14/20 0910   10/14/20 0400  cefUROXime (ZINACEF) 1.5 g in sodium chloride 0.9 % 100 mL IVPB        1.5 g 200 mL/hr over 30 Minutes Intravenous To Surgery 10/13/20 1039 10/14/20 0835   10/14/20 0400  cefUROXime (ZINACEF) 750 mg in sodium chloride 0.9 % 100 mL IVPB        750 mg 200 mL/hr over 30 Minutes Intravenous To Surgery 10/13/20 1039 10/14/20 1250   10/14/20 0400   vancomycin (VANCOCIN) 1,000 mg in sodium chloride 0.9 % 1,000 mL irrigation  Status:  Discontinued         Irrigation To Surgery 10/13/20 1039 10/14/20 1403   10/13/20 1657  vancomycin (VANCOCIN) 500-5 MG/100ML-% IVPB       Note to Pharmacy: Judieth Keens  : cabinet override      10/13/20 1657 10/13/20 1717   10/12/20 0600  ceFAZolin (ANCEF) IVPB 2g/100 mL premix        2 g 200 mL/hr over 30 Minutes Intravenous On call to O.R. 10/11/20 1841 10/12/20 1315   10/11/20 1415  vancomycin (VANCOCIN) IVPB 500 mg/100 ml premix  Status:  Discontinued        500 mg 100 mL/hr over 60 Minutes Intravenous Every M-W-F (Hemodialysis) 10/11/20 1413 10/14/20 1403   10/11/20 1413  vancomycin (VANCOCIN) 500-5 MG/100ML-% IVPB       Note to Pharmacy: Kalman Shan   : cabinet override      10/11/20 1413 10/12/20 0229   10/11/20 1353  Vancomycin (VANCOCIN) 750-5 MG/150ML-% IVPB  Status:  Discontinued       Note to Pharmacy: Kalman Shan   : cabinet override      10/11/20 1353 10/11/20 1417   10/08/20 1200  vancomycin (VANCOCIN) IVPB 750 mg/150 ml premix  Status:  Discontinued        750 mg 150 mL/hr over 60 Minutes Intravenous Every M-W-F (Hemodialysis) 10/05/20 1404 10/11/20 1413   10/06/20 1619  vancomycin (VANCOCIN) 500-5 MG/100ML-% IVPB       Note to Pharmacy: Judieth Keens  : cabinet override      10/06/20 1619 10/06/20 1736   10/06/20 1200  vancomycin (VANCOCIN) IVPB 750 mg/150 ml premix  Status:  Discontinued        750 mg 150 mL/hr over 60 Minutes Intravenous Every M-W-F (Hemodialysis) 10/04/20 0905 10/05/20 1404   10/06/20 1200  vancomycin (VANCOCIN) IVPB 500 mg/100 ml premix        500 mg 100 mL/hr over 60 Minutes Intravenous Every Wed (Hemodialysis) 10/05/20 1404 10/06/20 2000   10/01/20 2000  DAPTOmycin (CUBICIN) 840 mg in sodium chloride  0.9 % IVPB  Status:  Discontinued        840 mg 233.6 mL/hr over 30 Minutes Intravenous Every 48 hours 10/01/20 0922 10/01/20 1056   10/01/20 1200   vancomycin (VANCOCIN) IVPB 1000 mg/200 mL premix  Status:  Discontinued        1,000 mg 200 mL/hr over 60 Minutes Intravenous Every M-W-F (Hemodialysis) 10/01/20 1056 10/04/20 0845   09/29/20 1200  vancomycin (VANCOCIN) IVPB 1000 mg/200 mL premix  Status:  Discontinued        1,000 mg 200 mL/hr over 60 Minutes Intravenous Every M-W-F (Hemodialysis) 09/27/20 1443 10/01/20 0922   09/29/20 1200  ceFEPIme (MAXIPIME) 2 g in sodium chloride 0.9 % 100 mL IVPB  Status:  Discontinued        2 g 200 mL/hr over 30 Minutes Intravenous Every M-W-F (Hemodialysis) 09/27/20 1443 09/28/20 1054   09/28/20 0900  metroNIDAZOLE (FLAGYL) IVPB 500 mg  Status:  Discontinued        500 mg 100 mL/hr over 60 Minutes Intravenous Every 12 hours 09/27/20 1808 09/28/20 1054   09/28/20 0900  ceFEPIme (MAXIPIME) 1 g in sodium chloride 0.9 % 100 mL IVPB  Status:  Discontinued        1 g 200 mL/hr over 30 Minutes Intravenous Every 12 hours 09/27/20 1808 09/27/20 1812   09/27/20 1445  vancomycin (VANCOREADY) IVPB 2000 mg/400 mL        2,000 mg 200 mL/hr over 120 Minutes Intravenous  Once 09/27/20 1433 09/27/20 2330   09/27/20 1430  ceFEPIme (MAXIPIME) 2 g in sodium chloride 0.9 % 100 mL IVPB        2 g 200 mL/hr over 30 Minutes Intravenous  Once 09/27/20 1418 09/27/20 1541   09/27/20 1430  metroNIDAZOLE (FLAGYL) IVPB 500 mg        500 mg 100 mL/hr over 60 Minutes Intravenous  Once 09/27/20 1418 09/27/20 2214   09/27/20 1430  vancomycin (VANCOREADY) IVPB 1750 mg/350 mL  Status:  Discontinued        1,750 mg 175 mL/hr over 120 Minutes Intravenous  Once 09/27/20 1418 09/27/20 1433       Time spent: 31 minutes  Darliss Cheney MD [facility] 7 am - 330 pm/M-F for direct patient care and secure chat Please refer to Amion for contact info 40  days

## 2020-11-07 DIAGNOSIS — N2581 Secondary hyperparathyroidism of renal origin: Secondary | ICD-10-CM | POA: Diagnosis not present

## 2020-11-07 DIAGNOSIS — I639 Cerebral infarction, unspecified: Secondary | ICD-10-CM | POA: Diagnosis not present

## 2020-11-07 DIAGNOSIS — Z992 Dependence on renal dialysis: Secondary | ICD-10-CM | POA: Diagnosis not present

## 2020-11-07 DIAGNOSIS — D631 Anemia in chronic kidney disease: Secondary | ICD-10-CM | POA: Diagnosis not present

## 2020-11-07 DIAGNOSIS — I330 Acute and subacute infective endocarditis: Secondary | ICD-10-CM | POA: Diagnosis not present

## 2020-11-07 DIAGNOSIS — N186 End stage renal disease: Secondary | ICD-10-CM | POA: Diagnosis not present

## 2020-11-07 DIAGNOSIS — I5032 Chronic diastolic (congestive) heart failure: Secondary | ICD-10-CM | POA: Diagnosis not present

## 2020-11-07 DIAGNOSIS — I132 Hypertensive heart and chronic kidney disease with heart failure and with stage 5 chronic kidney disease, or end stage renal disease: Secondary | ICD-10-CM | POA: Diagnosis not present

## 2020-11-07 LAB — CBC WITH DIFFERENTIAL/PLATELET
Abs Immature Granulocytes: 0.16 10*3/uL — ABNORMAL HIGH (ref 0.00–0.07)
Basophils Absolute: 0.1 10*3/uL (ref 0.0–0.1)
Basophils Relative: 1 %
Eosinophils Absolute: 1.2 10*3/uL — ABNORMAL HIGH (ref 0.0–0.5)
Eosinophils Relative: 8 %
HCT: 23 % — ABNORMAL LOW (ref 39.0–52.0)
Hemoglobin: 7 g/dL — ABNORMAL LOW (ref 13.0–17.0)
Immature Granulocytes: 1 %
Lymphocytes Relative: 10 %
Lymphs Abs: 1.5 10*3/uL (ref 0.7–4.0)
MCH: 26.7 pg (ref 26.0–34.0)
MCHC: 30.4 g/dL (ref 30.0–36.0)
MCV: 87.8 fL (ref 80.0–100.0)
Monocytes Absolute: 2.3 10*3/uL — ABNORMAL HIGH (ref 0.1–1.0)
Monocytes Relative: 15 %
Neutro Abs: 10 10*3/uL — ABNORMAL HIGH (ref 1.7–7.7)
Neutrophils Relative %: 65 %
Platelets: 478 10*3/uL — ABNORMAL HIGH (ref 150–400)
RBC: 2.62 MIL/uL — ABNORMAL LOW (ref 4.22–5.81)
RDW: 18 % — ABNORMAL HIGH (ref 11.5–15.5)
WBC: 15.1 10*3/uL — ABNORMAL HIGH (ref 4.0–10.5)
nRBC: 0 % (ref 0.0–0.2)

## 2020-11-07 LAB — RENAL FUNCTION PANEL
Albumin: 2 g/dL — ABNORMAL LOW (ref 3.5–5.0)
Anion gap: 12 (ref 5–15)
BUN: 34 mg/dL — ABNORMAL HIGH (ref 6–20)
CO2: 26 mmol/L (ref 22–32)
Calcium: 7.8 mg/dL — ABNORMAL LOW (ref 8.9–10.3)
Chloride: 91 mmol/L — ABNORMAL LOW (ref 98–111)
Creatinine, Ser: 6.13 mg/dL — ABNORMAL HIGH (ref 0.61–1.24)
GFR, Estimated: 10 mL/min — ABNORMAL LOW (ref >60)
Glucose, Bld: 101 mg/dL — ABNORMAL HIGH (ref 70–99)
Phosphorus: 4.9 mg/dL — ABNORMAL HIGH (ref 2.5–4.6)
Potassium: 4.5 mmol/L (ref 3.5–5.1)
Sodium: 129 mmol/L — ABNORMAL LOW (ref 135–145)

## 2020-11-07 LAB — HEMOGLOBIN AND HEMATOCRIT, BLOOD
HCT: 26.6 % — ABNORMAL LOW (ref 39.0–52.0)
Hemoglobin: 8.2 g/dL — ABNORMAL LOW (ref 13.0–17.0)

## 2020-11-07 LAB — PROCALCITONIN: Procalcitonin: 0.8 ng/mL

## 2020-11-07 LAB — PREPARE RBC (CROSSMATCH)

## 2020-11-07 MED ORDER — CHLORHEXIDINE GLUCONATE CLOTH 2 % EX PADS
6.0000 | MEDICATED_PAD | Freq: Every day | CUTANEOUS | Status: DC
  Administered 2020-11-07 – 2020-11-09 (×3): 6 via TOPICAL

## 2020-11-07 MED ORDER — SODIUM CHLORIDE 0.9% IV SOLUTION: Freq: Once | INTRAVENOUS | Status: AC

## 2020-11-07 NOTE — Progress Notes (Signed)
[facility] Progress Note   Subjective:   Pt seen in room. Reports leg pain is "off and on." Also reports he gets short of breath after speaking for an extended period of time. Denies CP, palpitations, dizziness, abdominal pain and nausea. Hgb down to 7.0 this AM.   Objective Vitals:   11/06/20 2000 11/07/20 0100 11/07/20 0500 11/07/20 0826  BP: (!) 159/104 (!) 150/93 (!) 148/104 (!) 118/91  Pulse: 97 98 97 98  Resp: '20 20 20 20  '$ Temp: 98.1 F (36.7 C) 98.2 F (36.8 C) 98.6 F (37 C) 98.6 F (37 C)  TempSrc: Oral Oral Oral Oral  SpO2: 95% 96% 95% 95%  Weight:      Height:       Physical Exam General: WDWN male, alert and in NAD Heart: RRR, no murmurs, rubs or gallops Lungs: decreased breath sounds b/l bases but no wheezing, rhonchi or rales auscultated. Respirations unlabored on RA Abdomen: Soft, non-tender, non-distended, +BS Extremities: R AKA w/wound vac, no edema LLE Dialysis Access: LUE AVG + bruit  Additional Objective Labs: Basic Metabolic Panel: Recent Labs  Lab 11/05/20 0204 11/06/20 0150 11/07/20 0226  NA 130* 130* 129*  K 3.8 4.0 4.5  CL 93* 93* 91*  CO2 '25 28 26  '$ GLUCOSE 99 125* 101*  BUN 31* 21* 34*  CREATININE 6.63* 4.53* 6.13*  CALCIUM 8.1* 7.7* 7.8*  PHOS 4.4 3.2 4.9*   Liver Function Tests: Recent Labs  Lab 11/05/20 0204 11/06/20 0150 11/07/20 0226  AST  --  24  --   ALT  --  8  --   ALKPHOS  --  53  --   BILITOT  --  0.6  --   PROT  --  7.2  --   ALBUMIN 2.0* 1.9* 2.0*   No results for input(s): LIPASE, AMYLASE in the last 168 hours. CBC: Recent Labs  Lab 11/03/20 0310 11/04/20 0129 11/05/20 0204 11/06/20 0150 11/07/20 0226  WBC 18.1* 14.1* 15.1* 15.0* 15.1*  NEUTROABS 13.7* 10.1* 10.8* 10.0* 10.0*  HGB 7.6* 7.1* 7.4* 7.2* 7.0*  HCT 25.5* 23.5* 24.1* 23.7* 23.0*  MCV 89.2 90.4 88.3 90.1 87.8  PLT 410* 390 431* 462* 478*   Blood Culture    Component Value Date/Time   SDES BLOOD RIGHT HAND 11/05/2020 1645    SPECREQUEST  11/05/2020 1645    BOTTLES DRAWN AEROBIC ONLY Blood Culture results may not be optimal due to an inadequate volume of blood received in culture bottles   CULT  11/05/2020 1645    NO GROWTH < 24 HOURS Performed at [facility], [place] [address], [place], [place] [address]    REPTSTATUS PENDING 11/05/2020 1645   CBG: Recent Labs  Lab 11/03/20 1206  GLUCAP 83    Studies/Results: DG CHEST PORT 1 VIEW  Result Date: 11/05/2020 CLINICAL DATA:  Shortness of breath, chest pain. EXAM: PORTABLE CHEST 1 VIEW COMPARISON:  October 18, 2020. FINDINGS: Stable cardiomegaly. No pneumothorax is noted. Small bilateral pleural effusions are noted. Mild bibasilar subsegmental atelectasis is noted. Right upper lobe opacity is noted concerning for atelectasis or possibly scarring. 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 IMPRESSION: Mild bibasilar subsegmental atelectasis is noted with small bilateral pleural effusions. Right upper lobe opacity is again noted concerning for atelectasis or possibly scarring. Electronically Signed   By: Marijo Conception M.D.   On: 11/05/2020 15:50   Medications: . sodium chloride    . sodium chloride 10 mL/hr  at 10/29/20 1351  . sodium chloride    . methocarbamol (ROBAXIN) IV    . vancomycin Stopped (11/05/20 1125)   . (feeding supplement) PROSource Plus  30 mL Oral BID BM  . sodium chloride   Intravenous Once  . aspirin EC  325 mg Oral Daily  . calcium acetate  667 mg Oral TID WC  . Chlorhexidine Gluconate Cloth  6 each Topical Q0600  . Chlorhexidine Gluconate Cloth  6 each Topical Q0600  . cinacalcet  60 mg Oral Q breakfast  . cloNIDine  0.3 mg Transdermal Weekly  . [START ON 11/08/2020] darbepoetin (ARANESP) injection - DIALYSIS  100 mcg Intravenous Q Mon-HD  . docusate sodium  100 mg Oral BID  . enoxaparin (LOVENOX) injection  30 mg Subcutaneous Q24H  . feeding supplement (NEPRO CARB STEADY)  237 mL Oral TID BM  . influenza vac split  quadrivalent PF  0.5 mL Intramuscular Tomorrow-1000  . metoprolol tartrate  50 mg Oral BID  . multivitamin  1 tablet Oral QHS  . oxyCODONE  15 mg Oral Q12H  . oxymetazoline  1 spray Each Nare BID  . pantoprazole  40 mg Oral Daily  . pregabalin  75 mg Oral QHS  . senna-docusate  1 tablet Oral BID  . 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 Pathology showedstaph aureus in the tibia. 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 SP min invasive MVR(bioprosthetic)replacement - on 3/10. Doing well from cardiac standpoint 3. 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 Underwent tooth extraction3/8/22.PerID to get 6 wks IV vanc w/ end date of 11/25/20. 4. ESRD - Continue MWF schedule, next HD will be 11/08/20. Tolerating dialysis well.  5. HTN/volume -reports dyspnea with speaking. CXR 4/1 with small bilateral pleural effusions and atelectasis but no edema. Na trending down to 129. Will increase UF goal with HD tomorrow.  6. Anemiaof CKD-HGB 7.0 this AM. 1 unit PRBC ordered. S/P 2 units of PRBCs 3/10.Continue darbepoetin 100 mcg next dose will be 11/08/2020,no IV Fe due to osteomyelitis.  7. Secondary Hyperparathyroidism -Calcium and phosphorus controlled. Continue sensipar, binders, and hectorol 8. Hx Hep C /liver cirrhosis. : Anice PaganiniPA-C 11/07/2020, 9:22 AM  [facility] Pager: [phone]

## 2020-11-07 NOTE — Progress Notes (Signed)
Mobility Specialist - Progress Note   11/07/20 1036  Mobility  Activity Contraindicated/medical hold   Pt currently receiving blood transfusion. Will f/u as able.   Pricilla Handler Mobility Specialist Mobility Specialist Phone: [phone]

## 2020-11-07 NOTE — Progress Notes (Signed)
[facility] PROGRESS NOTE  Diland Novosad [id number] DOB: 12-26-1970 DOA: 09/27/2020 PCP: [name], MD  Status: Remains inpatient appropriate because:Ongoing active pain requiring inpatient pain management, Unsafe d/c plan and IV treatments appropriate due to intensity of illness or inability to take PO   Dispo: The patient is from: Home              Anticipated d/c is to: SNF              Patient currently is not medically stable to d/c. 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  Seen by ID managed with IV antibiotic, CT surgery consult salt was obtained subsequently underwent minimally invasive mitral valve replacement on 10/14/2020 and was managed under CT surgery service and subsequently transferred to [facility] on 3/16.  Patient was followed by ID, orthopedics.  He was found to have swelling on the right leg and seen by orthopedic underwent debridement on the right leg and treated for large complicated abscess with osteomyelitis involving his tibia with 7 by X.3 centimeter area of bone that was excised. Patient was seen by Dr. Sharol Given orthopedic ID and the right above-the-knee amputation has been recommended moving forward to manage this infection. Patient has been told by multiple providers multiple times the need for  the above-the-knee amputation to avoid seeding of his new valve and his graft but has been reluctant and he is aware about dying from staph aureus bacteremia and sepsis. 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 He has had significant anemia in the setting of surgery and wound VAC with bloody drainage  Subjective: Seen and examined.  No complaints.  Pain in the leg is improved.  Objective: Vitals:   11/07/20 1022 11/07/20 1037  BP: 130/84 (!) 119/94  Pulse: 95 94  Resp: 18 18  Temp: 98.2 F (36.8 C) 98.1 F (36.7 C)  SpO2: 96% 90%    Intake/Output Summary (Last 24 hours) at 11/07/2020 1117 Last data filed at 11/07/2020 1037 Gross per 24 hour  Intake 240 ml  Output --  Net 240 ml   Filed Weights   11/05/20 1204 11/06/20 0600 11/06/20 0620  Weight: 72.2 kg 73 kg 73 kg    Exam: General exam: Appears calm and comfortable  Respiratory system: Clear to auscultation. Respiratory effort normal. Cardiovascular system: S1 & S2 heard, RRR. No JVD, murmurs, rubs, gallops or clicks. No pedal edema. Gastrointestinal system: Abdomen is nondistended, soft and nontender. No organomegaly or masses felt. Normal bowel sounds heard. Central nervous system: Alert and oriented. No focal neurological deficits. Extremities: Right AKA with wound VAC attached. Skin: No rashes, lesions or ulcers.  Psychiatry: Judgement and insight appear poor. 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 Repeat blood cultures from 10/13/20 are negative. -WBCs trending downward from a peak of 21.8 on 3/26 and as of 3/31 [id number]  Large  complicated abscess with osteomyelitis involving his right tibia- S/P Rt AKA 3/25 -Followed by ID, vascular, CT surgery and Dr. Sharol Given  -Patient initially was very reluctant to proceed with AKA but given clinical status eventually agreed -Continue Lyrica 75 mg at at bedtime -Continue OxyContin to 15 mg every 12 hours.  -Plan to eventually wean and discontinue IV Dilaudid then wean and discontinue oral oxycodone as OxyContin dose titrated upward.  -Eventual goal is to eventually wean and discontinue OxyContin   Status post right AKA continue pain management and wound care as above, wound VAC in place-with bloody drainage likely contributing to anemia.  Incision (Closed) 10/14/20 Groin Right (Active)  Date First Assessed/Time First Assessed: 10/14/20 1132   Location: Groin  Location Orientation: Right    Assessments 10/16/2020  8:00 PM 11/06/2020  8:00 PM  Dressing Type None None  Site / Wound Assessment Clean;Dry Clean;Dry  Margins Attached edges (approximated) Attached edges (approximated)  Closure -- Approximated  Drainage Amount None --  Treatment Other (Comment) --     No Linked orders to display     Incision (Closed) 10/14/20 Chest Right (Active)  Date First Assessed/Time First Assessed: 10/14/20 1134   Location: Chest  Location Orientation: Right    Assessments 10/14/2020  2:15 PM 11/05/2020  8:00 PM  Dressing Type Silver hydrofiber None  Dressing Clean;Dry;Intact --  Dressing Change Frequency Other (Comment) --  Site / Wound Assessment Dressing in place / Unable to assess Clean;Dry  Margins -- Attached edges (approximated)  Closure -- Skin glue  Drainage Amount -- None  Treatment Other (Comment) --     No Linked orders to display     Incision (Closed) 10/20/20 Leg Right (Active)  Date First Assessed/Time First Assessed: 10/20/20 1656   Location: Leg  Location Orientation: Right    Assessments 10/21/2020  8:30 AM 11/07/2020  8:39 AM  Dressing Type Compression wrap Negative  pressure wound therapy  Dressing Clean;Dry;Intact Clean;Dry;Intact  Site / Wound Assessment Dressing in place / Unable to assess Dressing in place / Unable to assess     No Linked orders to display     Incision (Closed) 10/29/20 Knee Right (Active)  Date First Assessed/Time First Assessed: 10/29/20 1341   Location: Knee  Location Orientation: Right    Assessments 10/29/2020  3:00 PM 11/06/2020  8:00 PM  Dressing Type Other (Comment) Negative pressure wound therapy  Dressing Clean;Dry;Intact Clean;Dry  Site / Wound Assessment Dressing in place / Unable to assess --  Drainage Amount None Minimal     No Linked orders to display     Negative Pressure Wound Therapy Knee Right (Active)  Placement Date/Time: 10/29/20 1439   Wound Type: Surgical (Open wound)  Location: Knee  Location Orientation: Right    Assessments 10/29/2020  3:00 PM 11/06/2020  8:00 PM  Site / Wound Assessment Dressing in place / Unable to assess --  Cycle -- Continuous  Target Pressure (mmHg) 125 125  Dressing Status -- Intact  Drainage Amount -- Minimal  Output (mL) 0 mL --     No Linked orders to display    ESRD and HD MWF:  -Appreciate nephrology assistance  Acute  blood loss anemia in the setting of surgery, and wound VAC bloody draiange/Anemia of chronic kidney disease -S/p 3 units PRBC -Continue aranesp and dose was increased.   -No iron due to acute infection.   -Hemoglobin down to 7.0, trending down every day.  Will transfuse 1 unit.  Hypertension  -Continue metoprolol and clonidine patch as well as regular hemodialysis.  Nutrition Status: Nutrition Problem: Increased nutrient needs Etiology: chronic illness (ESRD on HD) Signs/Symptoms: estimated needs Interventions: Nepro shake,Refer to RD note for recommendations Estimated body mass index is 21.82 kg/m as calculated from the following:   Height as of this encounter: 6' 0.01" (1.829 m).   Weight as of this encounter: 73 kg.   Other  problems: Hyperkalemia:  -Resolved  Secondary hyperparathyroidism -continue Sensipar binders and Hectorol per nephrology.   History of hepatitis C/liver cirrhosis -outpatient follow-up with ID. -Monitor LFTs intermittently.    Hyponatremia  -Not unexpected in dialysis patient and can be managed with dialysate per nephrology   Nasal bleeding  -3/20 resolved.  Data Reviewed: Basic Metabolic Panel: Recent Labs  Lab 11/03/20 0310 11/04/20 0129 11/05/20 0204 11/06/20 0150 11/07/20 0226  NA 128* 131* 130* 130* 129*  K 3.4* 3.5 3.8 4.0 4.5  CL 90* 95* 93* 93* 91*  CO2 '24 26 25 28 26  '$ GLUCOSE 94 124* 99 125* 101*  BUN 32* 19 31* 21* 34*  CREATININE 6.79* 4.55* 6.63* 4.53* 6.13*  CALCIUM 8.5* 7.7* 8.1* 7.7* 7.8*  PHOS 4.2 3.0 4.4 3.2 4.9*   Liver Function Tests: Recent Labs  Lab 11/03/20 0310 11/04/20 0129 11/05/20 0204 11/06/20 0150 11/07/20 0226  AST  --   --   --  24  --   ALT  --   --   --  8  --   ALKPHOS  --   --   --  53  --   BILITOT  --   --   --  0.6  --   PROT  --   --   --  7.2  --   ALBUMIN 2.2* 1.9* 2.0* 1.9* 2.0*   No results for input(s): LIPASE, AMYLASE in the last 168 hours. No results for input(s): AMMONIA in the last 168 hours. CBC: Recent Labs  Lab 11/03/20 0310 11/04/20 0129 11/05/20 0204 11/06/20 0150 11/07/20 0226  WBC 18.1* 14.1* 15.1* 15.0* 15.1*  NEUTROABS 13.7* 10.1* 10.8* 10.0* 10.0*  HGB 7.6* 7.1* 7.4* 7.2* 7.0*  HCT 25.5* 23.5* 24.1* 23.7* 23.0*  MCV 89.2 90.4 88.3 90.1 87.8  PLT 410* 390 431* 462* 478*   Cardiac Enzymes: No results for input(s): CKTOTAL, CKMB, CKMBINDEX, TROPONINI in the last 168 hours. BNP (last 3 results) No results for input(s): BNP in the last 8760 hours.  ProBNP (last 3 results) No results for input(s): PROBNP in the last 8760 hours.  CBG: Recent Labs  Lab 11/03/20 1206  GLUCAP 83    Recent Results (from the past 240 hour(s))  Culture, blood (routine x 2)     Status: None (Preliminary  result)   Collection Time: 11/05/20  4:32 PM   Specimen: BLOOD RIGHT HAND  Result Value Ref Range Status   Specimen Description BLOOD RIGHT HAND  Final   Special Requests   Final    BOTTLES DRAWN AEROBIC ONLY Blood Culture results may not be optimal due to an inadequate volume of blood received in culture bottles   Culture   Final    NO GROWTH < 24 HOURS Performed at  [facility], [place] [address], [place], [place] [address]    Report Status PENDING  Incomplete  Culture, blood (routine x 2)     Status: None (Preliminary result)   Collection Time: 11/05/20  4:45 PM   Specimen: BLOOD RIGHT HAND  Result Value Ref Range Status   Specimen Description BLOOD RIGHT HAND  Final   Special Requests   Final    BOTTLES DRAWN AEROBIC ONLY Blood Culture results may not be optimal due to an inadequate volume of blood received in culture bottles   Culture   Final    NO GROWTH < 24 HOURS Performed at [facility], [place] [address] [place], [place] [address]    Report Status PENDING  Incomplete     Studies: DG CHEST PORT 1 VIEW  Result Date: 11/05/2020 CLINICAL DATA:  Shortness of breath, chest pain. EXAM: PORTABLE CHEST 1 VIEW COMPARISON:  October 18, 2020. FINDINGS: Stable cardiomegaly. No pneumothorax is noted. Small bilateral pleural effusions are noted. Mild bibasilar subsegmental atelectasis is noted. Right upper lobe opacity is noted concerning for atelectasis or possibly scarring. 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 IMPRESSION: Mild bibasilar subsegmental atelectasis is noted with small bilateral pleural effusions. Right upper lobe opacity is again noted concerning for atelectasis or possibly scarring. Electronically Signed   By: Marijo Conception M.D.   On: 11/05/2020 15:50    Scheduled Meds: . (feeding supplement) PROSource Plus  30 mL Oral BID BM  . aspirin EC  325 mg Oral Daily  . calcium acetate  667 mg Oral TID WC  . Chlorhexidine Gluconate Cloth  6 each  Topical Q0600  . Chlorhexidine Gluconate Cloth  6 each Topical Q0600  . cinacalcet  60 mg Oral Q breakfast  . cloNIDine  0.3 mg Transdermal Weekly  . [START ON 11/08/2020] darbepoetin (ARANESP) injection - DIALYSIS  100 mcg Intravenous Q Mon-HD  . docusate sodium  100 mg Oral BID  . enoxaparin (LOVENOX) injection  30 mg Subcutaneous Q24H  . feeding supplement (NEPRO CARB STEADY)  237 mL Oral TID BM  . influenza vac split quadrivalent PF  0.5 mL Intramuscular Tomorrow-1000  . metoprolol tartrate  50 mg Oral BID  . multivitamin  1 tablet Oral QHS  . oxyCODONE  15 mg Oral Q12H  . oxymetazoline  1 spray Each Nare BID  . pantoprazole  40 mg Oral Daily  . pregabalin  75 mg Oral QHS  . senna-docusate  1 tablet Oral BID  . sodium chloride flush  3 mL Intravenous Q12H   Continuous Infusions: . sodium chloride    . sodium chloride 10 mL/hr at 10/29/20 1351  . sodium chloride    . methocarbamol (ROBAXIN) IV    . vancomycin Stopped (11/05/20 1125)    Principal Problem:   Bacterial endocarditis Active Problems:   Chronic combined systolic and diastolic congestive heart failure (HCC)   HTN (hypertension)   Anemia in chronic kidney disease   Hepatitis C   ESRD on dialysis (HCC)   End stage renal disease (HCC)   Chest pain   Sepsis ([place])   Pneumonia due to infectious agent   ADPKD (autosomal dominant polycystic kidney disease)   Sepsis due to pneumonia (HCC)   Lactic acidosis   Cerebral embolism with cerebral infarction   Bacteremia due to Staphylococcus aureus   Mitral regurgitation   Uncontrolled hypertension   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   Patent foramen ovale  Dental caries   Chronic apical periodontitis   Chronic periodontitis   Accretions on teeth   S/P minimally-invasive mitral valve replacement with bioprosthetic valve   S/P mitral valve repair   Abscess   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)   Abscess of right leg   S/P mitral  valve replacement   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)   S/P AKA (above knee amputation), right (HCC)   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   Consultants:  Neurology  Infectious disease  Nephrology  Cardiothoracic surgery  Orthopedic surgery  CIR physician  Palliative medicine  Procedures:  Echocardiogram with bubble study  Continuous EEG  TEE  Intraoperative TEE  Follow-up echocardiogram  Left heart catheterization  Multiple dental extractions with alveoloplasty  Minimally invasive mitral valve replacement using Medtronic Mosaic  Irrigation and debridement of leg  Right above-the-knee amputation  Antibiotics: Anti-infectives (From admission, onward)   Start     Dose/Rate Route Frequency Ordered Stop   10/30/20 1100  vancomycin (VANCOCIN) IVPB 750 mg/150 ml premix        750 mg 150 mL/hr over 60 Minutes Intravenous Once in dialysis 10/30/20 0833 10/30/20 1317   10/29/20 1000  ceFAZolin (ANCEF) IVPB 2g/100 mL premix  Status:  Discontinued        2 g 200 mL/hr over 30 Minutes Intravenous To Short Stay 10/29/20 0730 10/29/20 1634   10/27/20 2015  vancomycin (VANCOCIN) IVPB 750 mg/150 ml premix        750 mg 150 mL/hr over 60 Minutes Intravenous Every M-W-F (Hemodialysis) 10/27/20 2005     10/25/20 0922  vancomycin (VANCOCIN) 500-5 MG/100ML-% IVPB       Note to Pharmacy: Kalman Shan   : cabinet override      10/25/20 0922 10/25/20 0958   10/21/20 0600  ceFAZolin (ANCEF) IVPB 2g/100 mL premix        2 g 200 mL/hr over 30 Minutes Intravenous On call to O.R. 10/20/20 1349 10/21/20 0650   10/20/20 2243  vancomycin (VANCOCIN) 500-5 MG/100ML-% IVPB       Note to Pharmacy: Wallace Cullens   : cabinet override      10/20/20 2243 10/20/20 2243   10/20/20 1437  ceFAZolin (ANCEF) 2-4 GM/100ML-% IVPB       Note to Pharmacy: Ladoris Gene   : cabinet override      10/20/20 1437 10/20/20 1612   10/20/20 1200  vancomycin  (VANCOREADY) IVPB 500 mg/100 mL  Status:  Discontinued        500 mg 100 mL/hr over 60 Minutes Intravenous Every M-W-F (Hemodialysis) 10/20/20 0903 10/27/20 2005   10/18/20 1124  vancomycin variable dose per unstable renal function (pharmacist dosing)  Status:  Discontinued         Does not apply See admin instructions 10/18/20 1124 10/20/20 0903   10/16/20 1600  vancomycin (VANCOREADY) IVPB 500 mg/100 mL  Status:  Discontinued        500 mg 100 mL/hr over 60 Minutes Intravenous Every Sat (Hemodialysis) 10/16/20 1306 10/16/20 1312   10/15/20 1500  vancomycin (VANCOCIN) IVPB 500 mg/100 ml premix  Status:  Discontinued        500 mg 100 mL/hr over 60 Minutes Intravenous Every M-W-F (Hemodialysis) 10/15/20 1357 10/17/20 0942   10/14/20 2130  vancomycin (VANCOCIN) IVPB 1000 mg/200 mL premix  Status:  Discontinued        1,000 mg 200 mL/hr over 60 Minutes Intravenous  Once 10/14/20 1403 10/14/20 1547   10/14/20 2130  vancomycin (VANCOREADY) IVPB 500 mg/100 mL        500 mg 100 mL/hr over 60 Minutes Intravenous  Once 10/14/20 1547 10/14/20 2222   10/14/20 1730  cefUROXime (ZINACEF) 1.5 g in sodium chloride 0.9 % 100 mL IVPB        1.5 g 200 mL/hr over 30 Minutes Intravenous Every 24 hr x 2 10/14/20 1403 10/15/20 1718   10/14/20 1128  vancomycin (VANCOCIN) 1,000 mg in sodium chloride 0.9 % 1,000 mL irrigation  Status:  Discontinued          As needed 10/14/20 1128 10/14/20 1401   10/14/20 0400  vancomycin (VANCOREADY) IVPB 1250 mg/250 mL        1,250 mg 166.7 mL/hr over 90 Minutes Intravenous To Surgery 10/13/20 1039 10/14/20 0910   10/14/20 0400  cefUROXime (ZINACEF) 1.5 g in sodium chloride 0.9 % 100 mL IVPB        1.5 g 200 mL/hr over 30 Minutes Intravenous To Surgery 10/13/20 1039 10/14/20 0835   10/14/20 0400  cefUROXime (ZINACEF) 750 mg in sodium chloride 0.9 % 100 mL IVPB        750 mg 200 mL/hr over 30 Minutes Intravenous To Surgery 10/13/20 1039 10/14/20 1250   10/14/20 0400   vancomycin (VANCOCIN) 1,000 mg in sodium chloride 0.9 % 1,000 mL irrigation  Status:  Discontinued         Irrigation To Surgery 10/13/20 1039 10/14/20 1403   10/13/20 1657  vancomycin (VANCOCIN) 500-5 MG/100ML-% IVPB       Note to Pharmacy: Judieth Keens  : cabinet override      10/13/20 1657 10/13/20 1717   10/12/20 0600  ceFAZolin (ANCEF) IVPB 2g/100 mL premix        2 g 200 mL/hr over 30 Minutes Intravenous On call to O.R. 10/11/20 1841 10/12/20 1315   10/11/20 1415  vancomycin (VANCOCIN) IVPB 500 mg/100 ml premix  Status:  Discontinued        500 mg 100 mL/hr over 60 Minutes Intravenous Every M-W-F (Hemodialysis) 10/11/20 1413 10/14/20 1403   10/11/20 1413  vancomycin (VANCOCIN) 500-5 MG/100ML-% IVPB       Note to Pharmacy: Kalman Shan   : cabinet override      10/11/20 1413 10/12/20 0229   10/11/20 1353  Vancomycin (VANCOCIN) 750-5 MG/150ML-% IVPB  Status:  Discontinued       Note to Pharmacy: Kalman Shan   : cabinet override      10/11/20 1353 10/11/20 1417   10/08/20 1200  vancomycin (VANCOCIN) IVPB 750 mg/150 ml premix  Status:  Discontinued        750 mg 150 mL/hr over 60 Minutes Intravenous Every M-W-F (Hemodialysis) 10/05/20 1404 10/11/20 1413   10/06/20 1619  vancomycin (VANCOCIN) 500-5 MG/100ML-% IVPB       Note to Pharmacy: Judieth Keens  : cabinet override      10/06/20 1619 10/06/20 1736   10/06/20 1200  vancomycin (VANCOCIN) IVPB 750 mg/150 ml premix  Status:  Discontinued        750 mg 150 mL/hr over 60 Minutes Intravenous Every M-W-F (Hemodialysis) 10/04/20 0905 10/05/20 1404   10/06/20 1200  vancomycin (VANCOCIN) IVPB 500 mg/100 ml premix        500 mg 100 mL/hr over 60 Minutes Intravenous Every Wed (Hemodialysis) 10/05/20 1404 10/06/20 2000   10/01/20 2000  DAPTOmycin (CUBICIN) 840 mg in sodium chloride 0.9 % IVPB  Status:  Discontinued        840 mg 233.6 mL/hr over 30 Minutes Intravenous Every 48 hours 10/01/20 0922 10/01/20 1056   10/01/20 1200   vancomycin (VANCOCIN) IVPB 1000 mg/200 mL premix  Status:  Discontinued        1,000 mg 200 mL/hr over 60 Minutes Intravenous Every M-W-F (Hemodialysis) 10/01/20 1056 10/04/20 0845   09/29/20 1200  vancomycin (VANCOCIN) IVPB 1000 mg/200 mL premix  Status:  Discontinued        1,000 mg 200 mL/hr over 60 Minutes Intravenous Every M-W-F (Hemodialysis) 09/27/20 1443 10/01/20 0922   09/29/20 1200  ceFEPIme (MAXIPIME) 2 g in sodium chloride 0.9 % 100 mL IVPB  Status:  Discontinued        2 g 200 mL/hr over 30 Minutes Intravenous Every M-W-F (Hemodialysis) 09/27/20 1443 09/28/20 1054   09/28/20 0900  metroNIDAZOLE (FLAGYL) IVPB 500 mg  Status:  Discontinued        500 mg 100 mL/hr over 60 Minutes Intravenous Every 12 hours 09/27/20 1808 09/28/20 1054   09/28/20 0900  ceFEPIme (MAXIPIME) 1 g in sodium chloride 0.9 % 100 mL IVPB  Status:  Discontinued        1 g 200 mL/hr over 30 Minutes Intravenous Every 12 hours 09/27/20 1808 09/27/20 1812   09/27/20 1445  vancomycin (VANCOREADY) IVPB 2000 mg/400 mL        2,000 mg 200 mL/hr over 120 Minutes Intravenous  Once 09/27/20 1433 09/27/20 2330   09/27/20 1430  ceFEPIme (MAXIPIME) 2 g in sodium chloride 0.9 % 100 mL IVPB        2 g 200 mL/hr over 30 Minutes Intravenous  Once 09/27/20 1418 09/27/20 1541   09/27/20 1430  metroNIDAZOLE (FLAGYL) IVPB 500 mg        500 mg 100 mL/hr over 60 Minutes Intravenous  Once 09/27/20 1418 09/27/20 2214   09/27/20 1430  vancomycin (VANCOREADY) IVPB 1750 mg/350 mL  Status:  Discontinued        1,750 mg 175 mL/hr over 120 Minutes Intravenous  Once 09/27/20 1418 09/27/20 1433       Time spent: 27 minutes  Patrick Cheney MD [facility] 7 am - 330 pm/M-F for direct patient care and secure chat Please refer to [place] for contact info 41  days

## 2020-11-08 DIAGNOSIS — B9561 Methicillin susceptible Staphylococcus aureus infection as the cause of diseases classified elsewhere: Secondary | ICD-10-CM | POA: Diagnosis not present

## 2020-11-08 DIAGNOSIS — I5032 Chronic diastolic (congestive) heart failure: Secondary | ICD-10-CM | POA: Diagnosis not present

## 2020-11-08 DIAGNOSIS — Z9889 Other specified postprocedural states: Secondary | ICD-10-CM | POA: Diagnosis not present

## 2020-11-08 DIAGNOSIS — R5081 Fever presenting with conditions classified elsewhere: Secondary | ICD-10-CM

## 2020-11-08 DIAGNOSIS — I330 Acute and subacute infective endocarditis: Secondary | ICD-10-CM | POA: Diagnosis not present

## 2020-11-08 DIAGNOSIS — I639 Cerebral infarction, unspecified: Secondary | ICD-10-CM | POA: Diagnosis not present

## 2020-11-08 DIAGNOSIS — I63119 Cerebral infarction due to embolism of unspecified vertebral artery: Secondary | ICD-10-CM | POA: Diagnosis not present

## 2020-11-08 DIAGNOSIS — M792 Neuralgia and neuritis, unspecified: Secondary | ICD-10-CM | POA: Diagnosis not present

## 2020-11-08 DIAGNOSIS — N186 End stage renal disease: Secondary | ICD-10-CM | POA: Diagnosis not present

## 2020-11-08 DIAGNOSIS — R7881 Bacteremia: Secondary | ICD-10-CM | POA: Diagnosis not present

## 2020-11-08 DIAGNOSIS — Z89611 Acquired absence of right leg above knee: Secondary | ICD-10-CM | POA: Diagnosis not present

## 2020-11-08 DIAGNOSIS — Z992 Dependence on renal dialysis: Secondary | ICD-10-CM | POA: Diagnosis not present

## 2020-11-08 DIAGNOSIS — N2581 Secondary hyperparathyroidism of renal origin: Secondary | ICD-10-CM | POA: Diagnosis not present

## 2020-11-08 DIAGNOSIS — D631 Anemia in chronic kidney disease: Secondary | ICD-10-CM | POA: Diagnosis not present

## 2020-11-08 DIAGNOSIS — I150 Renovascular hypertension: Secondary | ICD-10-CM | POA: Diagnosis not present

## 2020-11-08 DIAGNOSIS — I132 Hypertensive heart and chronic kidney disease with heart failure and with stage 5 chronic kidney disease, or end stage renal disease: Secondary | ICD-10-CM | POA: Diagnosis not present

## 2020-11-08 DIAGNOSIS — R509 Fever, unspecified: Secondary | ICD-10-CM

## 2020-11-08 LAB — RENAL FUNCTION PANEL
Albumin: 2.1 g/dL — ABNORMAL LOW (ref 3.5–5.0)
Anion gap: 15 (ref 5–15)
BUN: 50 mg/dL — ABNORMAL HIGH (ref 6–20)
CO2: 22 mmol/L (ref 22–32)
Calcium: 7.6 mg/dL — ABNORMAL LOW (ref 8.9–10.3)
Chloride: 90 mmol/L — ABNORMAL LOW (ref 98–111)
Creatinine, Ser: 8.15 mg/dL — ABNORMAL HIGH (ref 0.61–1.24)
GFR, Estimated: 7 mL/min — ABNORMAL LOW (ref >60)
Glucose, Bld: 91 mg/dL (ref 70–99)
Phosphorus: 6 mg/dL — ABNORMAL HIGH (ref 2.5–4.6)
Potassium: 5.9 mmol/L — ABNORMAL HIGH (ref 3.5–5.1)
Sodium: 127 mmol/L — ABNORMAL LOW (ref 135–145)

## 2020-11-08 LAB — BPAM RBC
Blood Product Expiration Date: 202205052359
ISSUE DATE / TIME: 202204031017
Unit Type and Rh: 5100

## 2020-11-08 LAB — TYPE AND SCREEN
ABO/RH(D): O POS
Antibody Screen: NEGATIVE
Unit division: 0

## 2020-11-08 LAB — CBC WITH DIFFERENTIAL/PLATELET
Abs Immature Granulocytes: 0.25 10*3/uL — ABNORMAL HIGH (ref 0.00–0.07)
Basophils Absolute: 0.1 10*3/uL (ref 0.0–0.1)
Basophils Relative: 1 %
Eosinophils Absolute: 0.8 10*3/uL — ABNORMAL HIGH (ref 0.0–0.5)
Eosinophils Relative: 5 %
HCT: 25.3 % — ABNORMAL LOW (ref 39.0–52.0)
Hemoglobin: 7.9 g/dL — ABNORMAL LOW (ref 13.0–17.0)
Immature Granulocytes: 2 %
Lymphocytes Relative: 11 %
Lymphs Abs: 1.8 10*3/uL (ref 0.7–4.0)
MCH: 27.2 pg (ref 26.0–34.0)
MCHC: 31.2 g/dL (ref 30.0–36.0)
MCV: 87.2 fL (ref 80.0–100.0)
Monocytes Absolute: 2.5 10*3/uL — ABNORMAL HIGH (ref 0.1–1.0)
Monocytes Relative: 15 %
Neutro Abs: 11.2 10*3/uL — ABNORMAL HIGH (ref 1.7–7.7)
Neutrophils Relative %: 66 %
Platelets: 547 10*3/uL — ABNORMAL HIGH (ref 150–400)
RBC: 2.9 MIL/uL — ABNORMAL LOW (ref 4.22–5.81)
RDW: 17.2 % — ABNORMAL HIGH (ref 11.5–15.5)
WBC: 16.6 10*3/uL — ABNORMAL HIGH (ref 4.0–10.5)
nRBC: 0.1 % (ref 0.0–0.2)

## 2020-11-08 MED ORDER — DARBEPOETIN ALFA 200 MCG/0.4ML IJ SOSY
PREFILLED_SYRINGE | INTRAMUSCULAR | Status: AC
  Administered 2020-11-08: 200 ug via INTRAVENOUS
  Filled 2020-11-08: qty 0.4

## 2020-11-08 MED ORDER — METHOCARBAMOL 500 MG PO TABS
500.0000 mg | ORAL_TABLET | Freq: Three times a day (TID) | ORAL | Status: DC
  Administered 2020-11-08 – 2020-11-10 (×8): 500 mg via ORAL
  Filled 2020-11-08 (×8): qty 1

## 2020-11-08 MED ORDER — DARBEPOETIN ALFA 200 MCG/0.4ML IJ SOSY: 200.0000 ug | PREFILLED_SYRINGE | INTRAMUSCULAR | Status: DC

## 2020-11-08 MED ORDER — VANCOMYCIN HCL IN DEXTROSE 750-5 MG/150ML-% IV SOLN
INTRAVENOUS | Status: AC
  Administered 2020-11-08: 750 mg via INTRAVENOUS
  Filled 2020-11-08: qty 150

## 2020-11-08 NOTE — Progress Notes (Signed)
Mobility Specialist - Progress Note   11/08/20 1542  Mobility  Activity Ambulated in hall  Level of Assistance Standby assist, set-up cues, supervision of patient - no hands on  Assistive Device Front wheel walker  Distance Ambulated (ft) 60 ft  Mobility Response Tolerated fair  Mobility performed by Mobility specialist  $Mobility charge 1 Mobility   Pt c/o R stump pain during ambulation. Pt had to sit in a chair in his room upon returning, then scooted to his recliner. VSS.   Pricilla Handler Mobility Specialist Mobility Specialist Phone: [phone]

## 2020-11-08 NOTE — Progress Notes (Signed)
Pt received from HD. VSS. Lunch tray ordered. Call light in reach.  Clyde Canterbury, RN

## 2020-11-08 NOTE — Progress Notes (Signed)
[facility] PROGRESS NOTE  Patrick Anthony [id number] DOB: 10-18-1970 DOA: 09/27/2020 PCP: [name], MD  Status: Remains inpatient appropriate because:Ongoing active pain requiring inpatient pain management, Unsafe d/c plan and IV treatments appropriate due to intensity of illness or inability to take PO   Dispo: The patient is from: Home              Anticipated d/c is to: SNF              Patient currently is not medically stable to d/c. 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  Seen by ID managed with IV antibiotic, CT surgery consult salt was obtained subsequently underwent minimally invasive mitral valve replacement on 10/14/2020 and was managed under CT surgery service and subsequently transferred to [facility] on 3/16.  Patient was followed by ID, orthopedics.  He was found to have swelling on the right leg and seen by orthopedic underwent debridement on the right leg and treated for large complicated abscess with osteomyelitis involving his tibia with 7 by X.3 centimeter area of bone that was excised. Patient was seen by Dr. Sharol Given orthopedic ID and the right above-the-knee amputation has been recommended moving forward to manage this infection. Patient has been told by multiple providers multiple times the need for  the above-the-knee amputation to avoid seeding of his new valve and his graft but has been reluctant and he is aware about dying from staph aureus bacteremia and sepsis. 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  States pain is better but not optimized.  No other complaints verbalized.  Objective: Vitals:   11/08/20 0507 11/08/20 0739  BP: (!) 131/96 (!) 145/100  Pulse: 87 87  Resp: 15 20  Temp: 97.6 F (36.4 C) 97.7 F (36.5 C)  SpO2: 98% 100%    Intake/Output Summary (Last 24 hours) at 11/08/2020 0754 Last data filed at 11/07/2020 1335 Gross per 24 hour  Intake 690.83 ml  Output --  Net 690.83 ml   Filed Weights   11/06/20 0600 11/06/20 0620 11/08/20 0621  Weight: 73 kg 73 kg 75 kg    Exam:  Constitutional: Awake and in no acute distress, calm Respiratory: Lung sounds are clear, stable on room air Cardiovascular: Normotensive and nontachycardic during dialysis.  Skin warm and dry Abdomen:  LBM 4/1 abdomen is soft nontender.  Bowel sounds are present.  Eating well. Musculoskeletal: s/p R AKA with wound VAC in place, Neurologic: CN 2-12 grossly intact. Sensation intact, DTR normal. Strength 5/5 x all 4 extremities.  Psychiatric: Alert and oriented x3.  Pleasant affect.   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 Repeat blood cultures from 10/13/20 are negative. -WBCs trending downward from a peak of 21.8 on 3/26 and as of 3/31 14.1 -Had additional fever late last week with blood cultures negative.  No other source of infection found.  We will continue to follow.  No further  fevers.  Large complicated abscess with osteomyelitis involving his right tibia- S/P Rt AKA 3/25 -Followed by ID, vascular, CT surgery and Dr. Sharol Given  -Patient initially was very reluctant to proceed with AKA but given clinical status eventually agreed -Pain adequately controlled with combination of low-dose IV Dilaudid and modest dose oral oxycodone -We will discontinue Neurontin in favor of Lyrica 75 mg at at bedtime -We will start low-dose OxyContin 10 mg every 12 hours with plans to initially wean and discontinue IV Dilaudid then wean and discontinue oral oxycodone as OxyContin dose titrated upward.  Eventual goal is to eventually wean and discontinue OxyContin -On 4/4 will add Robaxin 500 3 times daily and discontinue IV Dilaudid   Status post right AKA continue pain management and wound care as above, wound VAC in place-with bloody drainage likely contributing to anemia.  Incision (Closed) 10/14/20 Groin Right (Active)  Date First Assessed/Time First Assessed: 10/14/20 1132   Location: Groin  Location Orientation: Right    Assessments 10/16/2020  8:00 PM 11/07/2020  9:05 PM  Dressing Type None None  Site / Wound Assessment Clean;Dry Clean;Dry  Margins Attached edges (approximated) Attached edges (approximated)  Closure -- Approximated;Skin glue  Drainage Amount None None  Treatment Other (Comment) --     No Linked orders to display     Incision (Closed) 10/14/20 Chest Right (Active)  Date First Assessed/Time First Assessed: 10/14/20 1134   Location: Chest  Location Orientation: Right    Assessments 10/14/2020  2:15 PM 11/07/2020  9:05 PM  Dressing Type Silver hydrofiber None  Dressing Clean;Dry;Intact --  Dressing Change Frequency Other (Comment) --  Site / Wound Assessment Dressing in place / Unable to assess Clean;Dry  Margins -- Attached edges (approximated)  Closure -- Approximated;Skin glue  Drainage Amount -- None  Treatment Other (Comment) --     No Linked orders to  display     Incision (Closed) 10/20/20 Leg Right (Active)  Date First Assessed/Time First Assessed: 10/20/20 1656   Location: Leg  Location Orientation: Right    Assessments 10/21/2020  8:30 AM 11/07/2020  8:39 AM  Dressing Type Compression wrap Negative pressure wound therapy  Dressing Clean;Dry;Intact Clean;Dry;Intact  Site / Wound Assessment Dressing in place / Unable to assess Dressing in place / Unable to assess     No Linked orders to display     Incision (Closed) 10/29/20 Knee Right (Active)  Date First Assessed/Time First Assessed: 10/29/20 1341   Location: Knee  Location Orientation: Right    Assessments 10/29/2020  3:00 PM 11/07/2020  9:05 PM  Dressing Type Other (Comment) Negative pressure wound therapy  Dressing Clean;Dry;Intact Clean;Dry;Intact  Site / Wound Assessment Dressing in place / Unable to assess Dressing in place / Unable to assess  Drainage Amount None Scant     No Linked orders to display     Negative Pressure Wound Therapy Knee Right (Active)  Placement Date/Time: 10/29/20 1439   Wound Type: Surgical (Open wound)  Location: Knee  Location Orientation: Right    Assessments 10/29/2020  3:00 PM 11/07/2020  9:05 PM  Site / Wound  Assessment Dressing in place / Unable to assess Dressing in place / Unable to assess  Peri-wound Assessment -- Intact  Cycle -- Continuous  Target Pressure (mmHg) 125 125  Dressing Status -- Intact  Drainage Amount -- Scant  Output (mL) 0 mL --     No Linked orders to display    ESRD and HD MWF:  -Appreciate nephrology assistance  Acute blood loss anemia in the setting of surgery, and wound VAC bloody draiange/Anemia of chronic kidney disease -S/p 3 units PRBC -Continue aranesp and dose was increased.   -No iron due to acute infection.   -Hemoglobin 7.1 as of 3/31-during the early portion of hospitalization patient's hemoglobin has ranged between 8.9 and 10.0.  He may benefit from additional transfusion..  Hypertension  -Continue  metoprolol and clonidine patch as well as regular hemodialysis.  Nutrition Status: Nutrition Problem: Increased nutrient needs Etiology: chronic illness (ESRD on HD) Signs/Symptoms: estimated needs Interventions: Nepro shake,Refer to RD note for recommendations Estimated body mass index is 22.42 kg/m as calculated from the following:   Height as of this encounter: 6' 0.01" (1.829 m).   Weight as of this encounter: 75 kg.   Other problems: Hyperkalemia:  -Resolved  Secondary hyperparathyroidism -continue Sensipar binders and Hectorol per nephrology.   History of hepatitis C/liver cirrhosis -outpatient follow-up with ID. -Monitor LFTs intermittently.    Hyponatremia  -Not unexpected in dialysis patient and can be managed with dialysate per nephrology   Nasal bleeding  -3/20 resolved.  Data Reviewed: Basic Metabolic Panel: Recent Labs  Lab 11/03/20 0310 11/04/20 0129 11/05/20 0204 11/06/20 0150 11/07/20 0226  NA 128* 131* 130* 130* 129*  K 3.4* 3.5 3.8 4.0 4.5  CL 90* 95* 93* 93* 91*  CO2 '24 26 25 28 26  '$ GLUCOSE 94 124* 99 125* 101*  BUN 32* 19 31* 21* 34*  CREATININE 6.79* 4.55* 6.63* 4.53* 6.13*  CALCIUM 8.5* 7.7* 8.1* 7.7* 7.8*  PHOS 4.2 3.0 4.4 3.2 4.9*   Liver Function Tests: Recent Labs  Lab 11/03/20 0310 11/04/20 0129 11/05/20 0204 11/06/20 0150 11/07/20 0226  AST  --   --   --  24  --   ALT  --   --   --  8  --   ALKPHOS  --   --   --  53  --   BILITOT  --   --   --  0.6  --   PROT  --   --   --  7.2  --   ALBUMIN 2.2* 1.9* 2.0* 1.9* 2.0*   No results for input(s): LIPASE, AMYLASE in the last 168 hours. No results for input(s): AMMONIA in the last 168 hours. CBC: Recent Labs  Lab 11/03/20 0310 11/04/20 0129 11/05/20 0204 11/06/20 0150 11/07/20 0226 11/07/20 1516  WBC 18.1* 14.1* 15.1* 15.0* 15.1*  --   NEUTROABS 13.7* 10.1* 10.8* 10.0* 10.0*  --   HGB 7.6* 7.1* 7.4* 7.2* 7.0* 8.2*  HCT 25.5* 23.5* 24.1* 23.7* 23.0* 26.6*  MCV 89.2  90.4 88.3 90.1 87.8  --   PLT 410* 390 431* 462* 478*  --    Cardiac Enzymes: No results for input(s): CKTOTAL, CKMB, CKMBINDEX, TROPONINI in the last 168 hours. BNP (last 3 results) No results for input(s): BNP in the last 8760 hours.  ProBNP (last 3 results) No results for input(s): PROBNP in the last 8760 hours.  CBG: Recent Labs  Lab 11/03/20 1206  GLUCAP 83    Recent Results (  from the past 240 hour(s))  Culture, blood (routine x 2)     Status: None (Preliminary result)   Collection Time: 11/05/20  4:32 PM   Specimen: BLOOD RIGHT HAND  Result Value Ref Range Status   Specimen Description BLOOD RIGHT HAND  Final   Special Requests   Final    BOTTLES DRAWN AEROBIC ONLY Blood Culture results may not be optimal due to an inadequate volume of blood received in culture bottles   Culture   Final    NO GROWTH 2 DAYS Performed at [facility], [place] [address], [place], [place] [address]    Report Status PENDING  Incomplete  Culture, blood (routine x 2)     Status: None (Preliminary result)   Collection Time: 11/05/20  4:45 PM   Specimen: BLOOD RIGHT HAND  Result Value Ref Range Status   Specimen Description BLOOD RIGHT HAND  Final   Special Requests   Final    BOTTLES DRAWN AEROBIC ONLY Blood Culture results may not be optimal due to an inadequate volume of blood received in culture bottles   Culture   Final    NO GROWTH 2 DAYS Performed at [facility], [place] [address], [place], [place] [address]    Report Status PENDING  Incomplete     Studies: No results found.  Scheduled Meds: . (feeding supplement) PROSource Plus  30 mL Oral BID BM  . aspirin EC  325 mg Oral Daily  . calcium acetate  667 mg Oral TID WC  . Chlorhexidine Gluconate Cloth  6 each Topical Q0600  . Chlorhexidine Gluconate Cloth  6 each Topical Q0600  . cinacalcet  60 mg Oral Q breakfast  . cloNIDine  0.3 mg Transdermal Weekly  . darbepoetin (ARANESP) injection - DIALYSIS  100 mcg  Intravenous Q Mon-HD  . docusate sodium  100 mg Oral BID  . enoxaparin (LOVENOX) injection  30 mg Subcutaneous Q24H  . feeding supplement (NEPRO CARB STEADY)  237 mL Oral TID BM  . influenza vac split quadrivalent PF  0.5 mL Intramuscular Tomorrow-1000  . metoprolol tartrate  50 mg Oral BID  . multivitamin  1 tablet Oral QHS  . oxyCODONE  15 mg Oral Q12H  . oxymetazoline  1 spray Each Nare BID  . pantoprazole  40 mg Oral Daily  . pregabalin  75 mg Oral QHS  . senna-docusate  1 tablet Oral BID  . sodium chloride flush  3 mL Intravenous Q12H   Continuous Infusions: . sodium chloride    . sodium chloride 10 mL/hr at 10/29/20 1351  . sodium chloride    . methocarbamol (ROBAXIN) IV    . vancomycin Stopped (11/05/20 1125)    Principal Problem:   Bacterial endocarditis Active Problems:   Chronic combined systolic and diastolic congestive heart failure (HCC)   HTN (hypertension)   Anemia in chronic kidney disease   Hepatitis C   ESRD on dialysis (HCC)   End stage renal disease (HCC)   Chest pain   Sepsis ([place])   Pneumonia due to infectious agent   ADPKD (autosomal dominant polycystic kidney disease)   Sepsis due to pneumonia (HCC)   Lactic acidosis   Cerebral embolism with cerebral infarction   Bacteremia due to Staphylococcus aureus   Mitral regurgitation   Uncontrolled hypertension   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   Patent foramen ovale   Dental caries   Chronic apical periodontitis   Chronic periodontitis   Accretions  on teeth   S/P minimally-invasive mitral valve replacement with bioprosthetic valve   S/P mitral valve repair   Abscess   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)   Abscess of right leg   S/P mitral valve replacement   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)   S/P AKA (above knee amputation), right (HCC)   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   Consultants:  Neurology  Infectious disease  Nephrology  Cardiothoracic surgery  Orthopedic surgery  CIR physician  Palliative medicine  Procedures:  Echocardiogram with bubble study  Continuous EEG  TEE  Intraoperative TEE  Follow-up echocardiogram  Left heart catheterization  Multiple dental extractions with alveoloplasty  Minimally invasive mitral valve replacement using Med[employer]onic Mosaic  Irrigation and debridement of leg  Right above-the-knee amputation  Antibiotics: Anti-infectives (From admission, onward)   Start     Dose/Rate Route Frequency Ordered Stop   10/30/20 1100  vancomycin (VANCOCIN) IVPB 750 mg/150 ml premix        750 mg 150 mL/hr over 60 Minutes Intravenous Once in dialysis 10/30/20 0833 10/30/20 1317   10/29/20 1000  ceFAZolin (ANCEF) IVPB 2g/100 mL premix  Status:  Discontinued        2 g 200 mL/hr over 30 Minutes Intravenous To Short Stay 10/29/20 0730 10/29/20 1634   10/27/20 2015  vancomycin (VANCOCIN) IVPB 750 mg/150 ml premix        750 mg 150 mL/hr over 60 Minutes Intravenous Every M-W-F (Hemodialysis) 10/27/20 2005     10/25/20 0922  vancomycin (VANCOCIN) 500-5 MG/100ML-% IVPB       Note to Pharmacy: Kalman Shan   : cabinet override      10/25/20 0922 10/25/20 0958   10/21/20 0600  ceFAZolin (ANCEF) IVPB 2g/100 mL premix        2 g 200 mL/hr over 30 Minutes Intravenous On call to O.R. 10/20/20 1349 10/21/20 0650   10/20/20 2243  vancomycin (VANCOCIN) 500-5 MG/100ML-% IVPB       Note to Pharmacy: Wallace Cullens   : cabinet override      10/20/20 2243 10/20/20 2243   10/20/20 1437  ceFAZolin (ANCEF) 2-4 GM/100ML-% IVPB       Note to Pharmacy: Ladoris Gene   : cabinet override      10/20/20 1437 10/20/20 1612   10/20/20 1200  vancomycin (VANCOREADY) IVPB 500 mg/100 mL  Status:  Discontinued        500 mg 100 mL/hr over 60 Minutes Intravenous Every M-W-F (Hemodialysis) 10/20/20 0903 10/27/20 2005   10/18/20  1124  vancomycin variable dose per unstable renal function (pharmacist dosing)  Status:  Discontinued         Does not apply See admin instructions 10/18/20 1124 10/20/20 0903   10/16/20 1600  vancomycin (VANCOREADY) IVPB 500 mg/100 mL  Status:  Discontinued        500 mg 100 mL/hr over 60 Minutes Intravenous Every Sat (Hemodialysis) 10/16/20 1306 10/16/20 1312   10/15/20 1500  vancomycin (VANCOCIN) IVPB 500 mg/100 ml premix  Status:  Discontinued        500 mg 100 mL/hr over 60 Minutes Intravenous Every M-W-F (Hemodialysis) 10/15/20 1357 10/17/20 0942   10/14/20 2130  vancomycin (VANCOCIN) IVPB 1000 mg/200 mL premix  Status:  Discontinued        1,000 mg 200 mL/hr over 60 Minutes Intravenous  Once 10/14/20 1403 10/14/20 1547   10/14/20 2130  vancomycin (VANCOREADY) IVPB 500  mg/100 mL        500 mg 100 mL/hr over 60 Minutes Intravenous  Once 10/14/20 1547 10/14/20 2222   10/14/20 1730  cefUROXime (ZINACEF) 1.5 g in sodium chloride 0.9 % 100 mL IVPB        1.5 g 200 mL/hr over 30 Minutes Intravenous Every 24 hr x 2 10/14/20 1403 10/15/20 1718   10/14/20 1128  vancomycin (VANCOCIN) 1,000 mg in sodium chloride 0.9 % 1,000 mL irrigation  Status:  Discontinued          As needed 10/14/20 1128 10/14/20 1401   10/14/20 0400  vancomycin (VANCOREADY) IVPB 1250 mg/250 mL        1,250 mg 166.7 mL/hr over 90 Minutes Intravenous To Surgery 10/13/20 1039 10/14/20 0910   10/14/20 0400  cefUROXime (ZINACEF) 1.5 g in sodium chloride 0.9 % 100 mL IVPB        1.5 g 200 mL/hr over 30 Minutes Intravenous To Surgery 10/13/20 1039 10/14/20 0835   10/14/20 0400  cefUROXime (ZINACEF) 750 mg in sodium chloride 0.9 % 100 mL IVPB        750 mg 200 mL/hr over 30 Minutes Intravenous To Surgery 10/13/20 1039 10/14/20 1250   10/14/20 0400  vancomycin (VANCOCIN) 1,000 mg in sodium chloride 0.9 % 1,000 mL irrigation  Status:  Discontinued         Irrigation To Surgery 10/13/20 1039 10/14/20 1403   10/13/20 1657   vancomycin (VANCOCIN) 500-5 MG/100ML-% IVPB       Note to Pharmacy: Judieth Keens  : cabinet override      10/13/20 1657 10/13/20 1717   10/12/20 0600  ceFAZolin (ANCEF) IVPB 2g/100 mL premix        2 g 200 mL/hr over 30 Minutes Intravenous On call to O.R. 10/11/20 1841 10/12/20 1315   10/11/20 1415  vancomycin (VANCOCIN) IVPB 500 mg/100 ml premix  Status:  Discontinued        500 mg 100 mL/hr over 60 Minutes Intravenous Every M-W-F (Hemodialysis) 10/11/20 1413 10/14/20 1403   10/11/20 1413  vancomycin (VANCOCIN) 500-5 MG/100ML-% IVPB       Note to Pharmacy: Kalman Shan   : cabinet override      10/11/20 1413 10/12/20 0229   10/11/20 1353  Vancomycin (VANCOCIN) 750-5 MG/150ML-% IVPB  Status:  Discontinued       Note to Pharmacy: Kalman Shan   : cabinet override      10/11/20 1353 10/11/20 1417   10/08/20 1200  vancomycin (VANCOCIN) IVPB 750 mg/150 ml premix  Status:  Discontinued        750 mg 150 mL/hr over 60 Minutes Intravenous Every M-W-F (Hemodialysis) 10/05/20 1404 10/11/20 1413   10/06/20 1619  vancomycin (VANCOCIN) 500-5 MG/100ML-% IVPB       Note to Pharmacy: Judieth Keens  : cabinet override      10/06/20 1619 10/06/20 1736   10/06/20 1200  vancomycin (VANCOCIN) IVPB 750 mg/150 ml premix  Status:  Discontinued        750 mg 150 mL/hr over 60 Minutes Intravenous Every M-W-F (Hemodialysis) 10/04/20 0905 10/05/20 1404   10/06/20 1200  vancomycin (VANCOCIN) IVPB 500 mg/100 ml premix        500 mg 100 mL/hr over 60 Minutes Intravenous Every Wed (Hemodialysis) 10/05/20 1404 10/06/20 2000   10/01/20 2000  DAPTOmycin (CUBICIN) 840 mg in sodium chloride 0.9 % IVPB  Status:  Discontinued        840 mg 233.6 mL/hr over  30 Minutes Intravenous Every 48 hours 10/01/20 0922 10/01/20 1056   10/01/20 1200  vancomycin (VANCOCIN) IVPB 1000 mg/200 mL premix  Status:  Discontinued        1,000 mg 200 mL/hr over 60 Minutes Intravenous Every M-W-F (Hemodialysis) 10/01/20 1056 10/04/20  0845   09/29/20 1200  vancomycin (VANCOCIN) IVPB 1000 mg/200 mL premix  Status:  Discontinued        1,000 mg 200 mL/hr over 60 Minutes Intravenous Every M-W-F (Hemodialysis) 09/27/20 1443 10/01/20 0922   09/29/20 1200  ceFEPIme (MAXIPIME) 2 g in sodium chloride 0.9 % 100 mL IVPB  Status:  Discontinued        2 g 200 mL/hr over 30 Minutes Intravenous Every M-W-F (Hemodialysis) 09/27/20 1443 09/28/20 1054   09/28/20 0900  metroNIDAZOLE (FLAGYL) IVPB 500 mg  Status:  Discontinued        500 mg 100 mL/hr over 60 Minutes Intravenous Every 12 hours 09/27/20 1808 09/28/20 1054   09/28/20 0900  ceFEPIme (MAXIPIME) 1 g in sodium chloride 0.9 % 100 mL IVPB  Status:  Discontinued        1 g 200 mL/hr over 30 Minutes Intravenous Every 12 hours 09/27/20 1808 09/27/20 1812   09/27/20 1445  vancomycin (VANCOREADY) IVPB 2000 mg/400 mL        2,000 mg 200 mL/hr over 120 Minutes Intravenous  Once 09/27/20 1433 09/27/20 2330   09/27/20 1430  ceFEPIme (MAXIPIME) 2 g in sodium chloride 0.9 % 100 mL IVPB        2 g 200 mL/hr over 30 Minutes Intravenous  Once 09/27/20 1418 09/27/20 1541   09/27/20 1430  metroNIDAZOLE (FLAGYL) IVPB 500 mg        500 mg 100 mL/hr over 60 Minutes Intravenous  Once 09/27/20 1418 09/27/20 2214   09/27/20 1430  vancomycin (VANCOREADY) IVPB 1750 mg/350 mL  Status:  Discontinued        1,750 mg 175 mL/hr over 120 Minutes Intravenous  Once 09/27/20 1418 09/27/20 1433       Time spent: 25 minutes    Erin Hearing ANP  [facility] 7 am - 330 pm/M-F for direct patient care and secure chat Please refer to Amion for contact info 42  days

## 2020-11-08 NOTE — Progress Notes (Signed)
[facility] Progress Note   Subjective:  Seen on HD - 3.5L UFG and tolerating. C/o dyspnea for past few days, using O2 today. Denies CP. Having intermittent R leg/phantom pains. Says mood is "hard to tell."  Objective Vitals:   11/08/20 0745 11/08/20 0759 11/08/20 0805 11/08/20 0830  BP: (!) 144/96 (!) 143/98 139/83 (!) 141/70  Pulse: 86 84 86 86  Resp: (!) 24 (!) '21 18 16  '$ Temp: 97.8 F (36.6 C)     TempSrc: Oral     SpO2: 95%     Weight: 75 kg     Height:       Physical Exam General: Well appearing man, NAD. On nasal O2, eyelids puffy Heart: RRR; no murmur. healed incision to R chest Lungs: CTAB; no wheezing Abdomen: soft, non-tender Extremities: R AKA with wound vac in place, no LLE edema Dialysis Access: LUE AVG + bruit  Additional Objective Labs: Basic Metabolic Panel: Recent Labs  Lab 11/05/20 0204 11/06/20 0150 11/07/20 0226  NA 130* 130* 129*  K 3.8 4.0 4.5  CL 93* 93* 91*  CO2 '25 28 26  '$ GLUCOSE 99 125* 101*  BUN 31* 21* 34*  CREATININE 6.63* 4.53* 6.13*  CALCIUM 8.1* 7.7* 7.8*  PHOS 4.4 3.2 4.9*   Liver Function Tests: Recent Labs  Lab 11/05/20 0204 11/06/20 0150 11/07/20 0226  AST  --  24  --   ALT  --  8  --   ALKPHOS  --  53  --   BILITOT  --  0.6  --   PROT  --  7.2  --   ALBUMIN 2.0* 1.9* 2.0*   CBC: Recent Labs  Lab 11/04/20 0129 11/05/20 0204 11/06/20 0150 11/07/20 0226 11/07/20 1516 11/08/20 0500  WBC 14.1* 15.1* 15.0* 15.1*  --  16.6*  NEUTROABS 10.1* 10.8* 10.0* 10.0*  --  11.2*  HGB 7.1* 7.4* 7.2* 7.0* 8.2* 7.9*  HCT 23.5* 24.1* 23.7* 23.0* 26.6* 25.3*  MCV 90.4 88.3 90.1 87.8  --  87.2  PLT 390 431* 462* 478*  --  547*   Medications: . sodium chloride    . sodium chloride 10 mL/hr at 10/29/20 1351  . sodium chloride    . methocarbamol (ROBAXIN) IV    . vancomycin Stopped (11/05/20 1125)   . (feeding supplement) PROSource Plus  30 mL Oral BID BM  . aspirin EC  325 mg Oral Daily  . calcium acetate   667 mg Oral TID WC  . Chlorhexidine Gluconate Cloth  6 each Topical Q0600  . Chlorhexidine Gluconate Cloth  6 each Topical Q0600  . cinacalcet  60 mg Oral Q breakfast  . cloNIDine  0.3 mg Transdermal Weekly  . darbepoetin (ARANESP) injection - DIALYSIS  100 mcg Intravenous Q Mon-HD  . docusate sodium  100 mg Oral BID  . enoxaparin (LOVENOX) injection  30 mg Subcutaneous Q24H  . feeding supplement (NEPRO CARB STEADY)  237 mL Oral TID BM  . influenza vac split quadrivalent PF  0.5 mL Intramuscular Tomorrow-1000  . metoprolol tartrate  50 mg Oral BID  . multivitamin  1 tablet Oral QHS  . oxyCODONE  15 mg Oral Q12H  . oxymetazoline  1 spray Each Nare BID  . pantoprazole  40 mg Oral Daily  . pregabalin  75 mg Oral QHS  . senna-docusate  1 tablet Oral BID  . sodium chloride flush  3 mL Intravenous Q12H    Dialysis Orders: MWF [facility]  4h 350/500 EDW  82.5 (seems ~73kg now)2/2.5 bath LUA AVGHep 5000 - Aranesp 68mgIVq 2wks - last 09/27/20 (Verified order) -Hectorol322m IV qHD   Assessment/Plan: 1. 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 S/p tooth extraction 10/12/20, followed by MVR (bioprosthetic) on 3/10. Continues on 6 week course of Vancomycin - end date 11/25/20. 2. 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 2. ESRD: Continue HD per MWF schedule - HD now, 3.5L UFG. 3. HTN/volume: BP slightly high. CXR 4/1 with small B effusions, need to incrementally lower EDW. 4. Anemia of ESRD: Hgb 7.9 - Continue Aranesp q Monday -> will increase dose to 20037m 5. Secondary hyperparathyroidism: CorrCa and Phos to goal, continue Phoslo/Sensipar. 6. Nutrition: Alb low, continue protein supplements. 7. Hx Hep C/liver cirrhosis  KatVeneta PentonA-C 11/08/2020, 8:32 AM  [employer]

## 2020-11-08 NOTE — Progress Notes (Signed)
Physical Therapy Treatment Patient Details Name: Patrick Anthony MRN: [id number] DOB: Dec 14, 1970 Today's Date: 11/08/2020    History of Present Illness Pt is a 50 y.o. male admitted 09/27/20 with fever, chest pain, SOB, generalized weakness. Found to have MRSA bacteremia. 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 ECHO with mitral valve vegetation. No significant CAD on LHC. S/p minimally invasive MVR with closure of PFO on 3/10. 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 PMH includes CKD (HD MWF), HTN, CHF, Hep C, polysubstance abuse.    PT Comments    Pt received in chair, agreeable to therapy session and with good participation and fair tolerance for mobility. Pt c/o increased L shoulder pain and continued RLE pain, RN notified he requests pain meds, but pt agreeable to transfer training and standing exercises. Pt min guard to minA for sit<>stand and squat pivot transfers, deferred gait trial due to pt with increased respiratory rate (to 40 rpm) and severe pain after therex. Of note, pt seen after return from dialysis which may have impacted activity tolerance. Plan to attempt curb height stair trial next date if appropriate. Pt continues to benefit from PT services to progress toward functional mobility goals. Continue to recommend SNF.   SNF;Supervision for mobility/OOB     Equipment Recommendations  Rolling walker with 5" wheels;3in1 (PT);Wheelchair (measurements PT);Wheelchair cushion (measurements PT)    Recommendations for Other Services       Precautions / Restrictions Precautions Precautions: Fall;Other (comment) Precaution Comments: Wound vac to R AKA Restrictions Weight Bearing Restrictions: Yes RLE Weight Bearing: Non weight bearing    Mobility  Bed Mobility Overal bed mobility: Modified Independent Bed Mobility: Sit to Supine       Sit to supine: Modified  independent (Device/Increase time)   General bed mobility comments: increased time to perform    Transfers Overall transfer level: Needs assistance Equipment used: Rolling walker (2 wheeled) Transfers: Sit to/from [employer] Sit to Stand: Min guard;Min assist Stand pivot transfers: Min assist Squat pivot transfers: Min guard     General transfer comment: from recliner>RW with minA x 2 reps and from drop arm chair >bed via squat pivot with min guard/assist with lines. no LOB but increased time to perform  Ambulation/Gait Ambulation/Gait assistance: Min guard Gait Distance (Feet): 5 Feet Assistive device: Rolling walker (2 wheeled) Gait Pattern/deviations: Shuffle (hop-to)     General Gait Details: decreased foot clearance and pt shaking, deferred longer gait trial 2/2 severe r/o pain and fatigue   Stairs             Wheelchair Mobility    Modified Rankin (Stroke Patients Only)       Balance Overall balance assessment: Needs assistance Sitting-balance support: No upper extremity supported;Feet supported Sitting balance-Leahy Scale: Good Sitting balance - Comments: static sitting and weight shifting EOB no LOB   Standing balance support: Bilateral upper extremity supported;During functional activity Standing balance-Leahy Scale: Poor Standing balance comment: reliant on UE support of RW                            Cognition Arousal/Alertness: Awake/alert Behavior During Therapy: WFL for tasks assessed/performed;Flat affect Overall Cognitive Status: Within Functional Limits for tasks assessed Area of Impairment: Safety/judgement;Problem solving;Attention;Awareness;Memory                     Memory: Decreased recall of  precautions;Decreased short-term memory Following Commands: Follows one step commands consistently Safety/Judgement: Decreased awareness of safety   Problem Solving: Requires verbal cues General Comments:  anxious regarding mobility still but can do more than he thinks, also inconsistent with c/o pain but has been c/o L shoulder pain in most sessions so RN notified to see if MD would see a need for imaging      Exercises Other Exercises Other Exercises: chair pushups 2 sets x5 reps Other Exercises: standing RLE AROM: hip flexion and extension x 10 reps, Standing BUE AROM: tricep extension/hops x10 reps    General Comments General comments (skin integrity, edema, etc.): BP 145/85 (97) seated in chair, BP 139/101 (110) standing, both taken on R forearm, pt elevated respiratory rate to 40rpm with exertion/pain, takes 1-2 minutes to decrease to <30 rpm; HR 104-110 bpm with standing activity      Pertinent Vitals/Pain Pain Assessment: Faces Faces Pain Scale: Hurts whole lot Pain Location: R residual limb and B shoulders/LUE>R, increases during mobility Pain Descriptors / Indicators: Grimacing;Sore;Shooting;Throbbing Pain Intervention(s): Limited activity within patient's tolerance;Monitored during session;Repositioned    Home Living                      Prior Function            PT Goals (current goals can now be found in the care plan section) Acute Rehab PT Goals Patient Stated Goal: to return home and be independent PT Goal Formulation: With patient Time For Goal Achievement: 11/14/20 Potential to Achieve Goals: Fair Progress towards PT goals: Progressing toward goals    Frequency    Min 3X/week      PT Plan Current plan remains appropriate    Co-evaluation              AM-PAC PT "6 Clicks" Mobility   Outcome Measure  Help needed turning from your back to your side while in a flat bed without using bedrails?: None Help needed moving from lying on your back to sitting on the side of a flat bed without using bedrails?: None Help needed moving to and from a bed to a chair (including a wheelchair)?: A Little Help needed standing up from a chair using your  arms (e.g., wheelchair or bedside chair)?: A Little Help needed to walk in hospital room?: A Little Help needed climbing 3-5 steps with a railing? : Total 6 Click Score: 18    End of Session Equipment Utilized During Treatment:  (pt defers gait belt) Activity Tolerance: Patient limited by pain Patient left: in bed;with call bell/phone within reach;Other (comment) (NT messaged to obtain ice pack for him, and to turn bed alarm on (remembered after PTA left unit)) Nurse Communication: Mobility status;Patient requests pain meds PT Visit Diagnosis: Difficulty in walking, not elsewhere classified (R26.2);Other abnormalities of gait and mobility (R26.89)     Time: [id number] PT Time Calculation (min) (ACUTE ONLY): 20 min  Charges:  $Therapeutic Exercise: 8-22 mins                     Pedro Whiters P., PTA Acute Rehabilitation Services Pager: [phone] Office: [place] 11/08/2020, 5:25 PM

## 2020-11-09 DIAGNOSIS — I63119 Cerebral infarction due to embolism of unspecified vertebral artery: Secondary | ICD-10-CM | POA: Diagnosis not present

## 2020-11-09 DIAGNOSIS — B9561 Methicillin susceptible Staphylococcus aureus infection as the cause of diseases classified elsewhere: Secondary | ICD-10-CM | POA: Diagnosis not present

## 2020-11-09 DIAGNOSIS — Z992 Dependence on renal dialysis: Secondary | ICD-10-CM | POA: Diagnosis not present

## 2020-11-09 DIAGNOSIS — Z515 Encounter for palliative care: Secondary | ICD-10-CM | POA: Diagnosis not present

## 2020-11-09 DIAGNOSIS — I330 Acute and subacute infective endocarditis: Secondary | ICD-10-CM | POA: Diagnosis not present

## 2020-11-09 DIAGNOSIS — R7881 Bacteremia: Secondary | ICD-10-CM | POA: Diagnosis not present

## 2020-11-09 DIAGNOSIS — Z7189 Other specified counseling: Secondary | ICD-10-CM | POA: Diagnosis not present

## 2020-11-09 DIAGNOSIS — N2581 Secondary hyperparathyroidism of renal origin: Secondary | ICD-10-CM | POA: Diagnosis not present

## 2020-11-09 DIAGNOSIS — Q612 Polycystic kidney, adult type: Secondary | ICD-10-CM | POA: Diagnosis not present

## 2020-11-09 DIAGNOSIS — D631 Anemia in chronic kidney disease: Secondary | ICD-10-CM | POA: Diagnosis not present

## 2020-11-09 DIAGNOSIS — M792 Neuralgia and neuritis, unspecified: Secondary | ICD-10-CM | POA: Diagnosis not present

## 2020-11-09 DIAGNOSIS — L02415 Cutaneous abscess of right lower limb: Secondary | ICD-10-CM | POA: Diagnosis not present

## 2020-11-09 DIAGNOSIS — I5032 Chronic diastolic (congestive) heart failure: Secondary | ICD-10-CM | POA: Diagnosis not present

## 2020-11-09 DIAGNOSIS — I639 Cerebral infarction, unspecified: Secondary | ICD-10-CM | POA: Diagnosis not present

## 2020-11-09 DIAGNOSIS — N186 End stage renal disease: Secondary | ICD-10-CM | POA: Diagnosis not present

## 2020-11-09 DIAGNOSIS — M86461 Chronic osteomyelitis with draining sinus, right tibia and fibula: Secondary | ICD-10-CM | POA: Diagnosis not present

## 2020-11-09 DIAGNOSIS — I132 Hypertensive heart and chronic kidney disease with heart failure and with stage 5 chronic kidney disease, or end stage renal disease: Secondary | ICD-10-CM | POA: Diagnosis not present

## 2020-11-09 LAB — CBC WITH DIFFERENTIAL/PLATELET
Abs Immature Granulocytes: 0.31 10*3/uL — ABNORMAL HIGH (ref 0.00–0.07)
Basophils Absolute: 0.1 10*3/uL (ref 0.0–0.1)
Basophils Relative: 1 %
Eosinophils Absolute: 1 10*3/uL — ABNORMAL HIGH (ref 0.0–0.5)
Eosinophils Relative: 6 %
HCT: 29.4 % — ABNORMAL LOW (ref 39.0–52.0)
Hemoglobin: 9 g/dL — ABNORMAL LOW (ref 13.0–17.0)
Immature Granulocytes: 2 %
Lymphocytes Relative: 11 %
Lymphs Abs: 1.8 10*3/uL (ref 0.7–4.0)
MCH: 26.9 pg (ref 26.0–34.0)
MCHC: 30.6 g/dL (ref 30.0–36.0)
MCV: 88 fL (ref 80.0–100.0)
Monocytes Absolute: 2.4 10*3/uL — ABNORMAL HIGH (ref 0.1–1.0)
Monocytes Relative: 15 %
Neutro Abs: 10.2 10*3/uL — ABNORMAL HIGH (ref 1.7–7.7)
Neutrophils Relative %: 65 %
Platelets: 590 10*3/uL — ABNORMAL HIGH (ref 150–400)
RBC: 3.34 MIL/uL — ABNORMAL LOW (ref 4.22–5.81)
RDW: 17.4 % — ABNORMAL HIGH (ref 11.5–15.5)
WBC: 15.8 10*3/uL — ABNORMAL HIGH (ref 4.0–10.5)
nRBC: 0.4 % — ABNORMAL HIGH (ref 0.0–0.2)

## 2020-11-09 LAB — RENAL FUNCTION PANEL
Albumin: 2.1 g/dL — ABNORMAL LOW (ref 3.5–5.0)
Anion gap: 13 (ref 5–15)
BUN: 29 mg/dL — ABNORMAL HIGH (ref 6–20)
CO2: 26 mmol/L (ref 22–32)
Calcium: 7.9 mg/dL — ABNORMAL LOW (ref 8.9–10.3)
Chloride: 92 mmol/L — ABNORMAL LOW (ref 98–111)
Creatinine, Ser: 5.57 mg/dL — ABNORMAL HIGH (ref 0.61–1.24)
GFR, Estimated: 12 mL/min — ABNORMAL LOW (ref >60)
Glucose, Bld: 110 mg/dL — ABNORMAL HIGH (ref 70–99)
Phosphorus: 4.1 mg/dL (ref 2.5–4.6)
Potassium: 3.9 mmol/L (ref 3.5–5.1)
Sodium: 131 mmol/L — ABNORMAL LOW (ref 135–145)

## 2020-11-09 LAB — SARS CORONAVIRUS 2 (TAT 6-24 HRS): SARS Coronavirus 2: NEGATIVE

## 2020-11-09 MED ORDER — BISACODYL 10 MG RE SUPP
10.0000 mg | Freq: Every day | RECTAL | 0 refills | Status: AC | PRN
Start: 2020-11-09 — End: ?

## 2020-11-09 MED ORDER — RENA-VITE PO TABS: 1.0000 | ORAL_TABLET | Freq: Every day | ORAL | 0 refills | Status: DC

## 2020-11-09 MED ORDER — VANCOMYCIN HCL IN DEXTROSE 750-5 MG/150ML-% IV SOLN: 750.0000 mg | INTRAVENOUS | Status: DC

## 2020-11-09 MED ORDER — METHOCARBAMOL 500 MG PO TABS: 500.0000 mg | ORAL_TABLET | Freq: Three times a day (TID) | ORAL | Status: AC

## 2020-11-09 MED ORDER — POLYETHYLENE GLYCOL 3350 17 G PO PACK: 17.0000 g | PACK | Freq: Every day | ORAL | 0 refills | Status: AC | PRN

## 2020-11-09 MED ORDER — PANTOPRAZOLE SODIUM 40 MG PO TBEC: 40.0000 mg | DELAYED_RELEASE_TABLET | Freq: Every day | ORAL | Status: AC

## 2020-11-09 MED ORDER — OXYCODONE HCL ER 15 MG PO T12A: 15.0000 mg | EXTENDED_RELEASE_TABLET | Freq: Two times a day (BID) | ORAL | 0 refills | Status: DC

## 2020-11-09 MED ORDER — CALCIUM ACETATE (PHOS BINDER) 667 MG PO CAPS
667.0000 mg | ORAL_CAPSULE | Freq: Three times a day (TID) | ORAL | Status: DC
Start: 2020-11-09 — End: 2021-01-06

## 2020-11-09 MED ORDER — PREGABALIN 75 MG PO CAPS
75.0000 mg | ORAL_CAPSULE | Freq: Every day | ORAL | 0 refills | Status: AC
Start: 2020-11-09 — End: ?

## 2020-11-09 MED ORDER — HYDROMORPHONE HCL 2 MG PO TABS: 2.0000 mg | ORAL_TABLET | ORAL | 0 refills | Status: DC | PRN

## 2020-11-09 MED ORDER — NEPRO/CARBSTEADY PO LIQD: 237.0000 mL | Freq: Three times a day (TID) | ORAL | 0 refills | Status: AC

## 2020-11-09 MED ORDER — SENNOSIDES-DOCUSATE SODIUM 8.6-50 MG PO TABS: 1.0000 | ORAL_TABLET | Freq: Two times a day (BID) | ORAL | Status: AC

## 2020-11-09 MED ORDER — DOCUSATE SODIUM 100 MG PO CAPS
100.0000 mg | ORAL_CAPSULE | Freq: Two times a day (BID) | ORAL | 0 refills | Status: AC
Start: 2020-11-09 — End: ?

## 2020-11-09 MED ORDER — ACETAMINOPHEN 325 MG PO TABS: 325.0000 mg | ORAL_TABLET | Freq: Four times a day (QID) | ORAL | Status: DC | PRN

## 2020-11-09 MED ORDER — METOPROLOL TARTRATE 50 MG PO TABS
50.0000 mg | ORAL_TABLET | Freq: Two times a day (BID) | ORAL | Status: DC
Start: 2020-11-09 — End: 2021-01-06

## 2020-11-09 MED ORDER — ASPIRIN 325 MG PO TBEC: 325.0000 mg | DELAYED_RELEASE_TABLET | Freq: Every day | ORAL | 0 refills | Status: AC

## 2020-11-09 MED ORDER — PROSOURCE PLUS PO LIQD: 30.0000 mL | Freq: Two times a day (BID) | ORAL | Status: AC

## 2020-11-09 MED ORDER — CINACALCET HCL 30 MG PO TABS: 60.0000 mg | ORAL_TABLET | Freq: Every day | ORAL | Status: AC

## 2020-11-09 NOTE — Progress Notes (Signed)
Per outpatient HD clinic/[facility], patient's seat time is MWF 5:45am. He needs to arrive at 5:30am. Navigator sees that patient is going to [place] and spoke with DTP team to ensure that SNF can transport at this time. Navigator will continue to follow.  Alphonzo Cruise, [facility] Navigator [phone]

## 2020-11-09 NOTE — Progress Notes (Signed)
Daily Progress Note   Patient Name: Patrick Anthony       Date: 11/09/2020 DOB: 22-Jul-1971  Age: 50 y.o. MRN#: [id number] Attending Physician: Darliss Cheney, MD Primary Care Physician: [name], MD Admit Date: 09/27/2020  Reason for Consultation/Follow-up: Establishing goals of care  Subjective: Awake, alert and oriented x3. No acute distress. States pain is much better today but does continue to have intermittent muscle spasms.   Updates provided. Patient states he is hopeful for SNF rehab placement and eventually returning back home with some independence. He shares is thankful that he decided to have the surgery although the recovery is tedious he is alive and feels his quality of life is good.   Patient is requesting to complete advanced directives expressing with such a major surgery he knows documents are important. Education provided on advanced directive and MOST form. Spiritual care referral to be place to assist in completing documents. He indicates his daughters Patrick Anthony and Patrick Anthony would be his medical decision makers. Confirming wishes for full code and full aggressive interventions that would be required to allow him every opportunity to live or have a chance at life, even if that means being bed bound with tubes such as artificial feedings.   I completed a MOST form today. [name] outlined his wishes for the following treatment decisions:  Cardiopulmonary Resuscitation: Attempt Resuscitation (CPR)  Medical Interventions: Full Scope of Treatment: Use intubation, advanced airway interventions, mechanical ventilation, cardioversion as indicated, medical treatment, IV fluids, etc, also provide comfort measures. Transfer to the hospital if indicated  Antibiotics: Antibiotics if indicated  IV Fluids: IV fluids if indicated  Feeding Tube: Feeding tube long-term if indicated    Length of Stay: 43 days  Vital Signs: BP 109/81 (BP Location: Right Arm)   Pulse 86   Temp 98 F (36.7  C) (Oral)   Resp 18   Ht 6' 0.01" (1.829 m)   Wt 73 kg   SpO2 99%   BMI 21.82 kg/m  SpO2: SpO2: 99 % O2 Device: O2 Device: Room Air O2 Flow Rate: O2 Flow Rate (L/min): 0 L/min  Physical Exam: Awake, AAO x3, NAD RRR Normal breathing pattern  Palliative Care Assessment & Plan  HPI: Palliative Care consult requested for goals of care discussion in this 50 y.o. male with past medical history of ESRD (MWF dialysis), hypertension, diastolic CHF, liver cirrhosis, hepatitis C, EF 55-60%, and alcohol abuse. He presented to the ER from home with complaints of generalized weakness. He was initially admitted and received IV antibiotics and treatment for MRSA bacteremia. Subsequently patient began having symptoms and Code stroke called. CT head showed embolic stroke. Blood cultures positive for staph aureus. TEE concerning for mitral valve endocarditis due to large vegetation. Patient followed by TCS  And underwent mitral valve replacement (10/14/2020). Followed by orthopedics in the setting of right leg swelling and large abscess with osteomyelitis. Debridement performed with recommendations for right above-the-knee amputation.   Code Status:  Full code  Goals of Care/Recommendations:  Full Code/Full Scope as requested by patient. MOST form completed and he is requesting assistance with completing advanced directive which he has at the bedside ([place] referral placed)  Continue to treat the treatable. Patient is hopeful for discharge soon to SNF rehab with goal of eventually returning home with some independence.   Reports pain is much better today, patient has not received Oxy IR PRN, if goal is to discharge on Oxy would consider weaning Dilaudid and focusing on Oxy use with a  goal of maintaining comfort with long-acting medication and breakthrough Oxy.   Dilaudid PRN   Robaxin PRN  Oxy-IR PRN  Oxycontin every 12 hours   PMT will continue to support and follow.   Prognosis: Guarded    Discharge Planning: [place] for rehab with Palliative care service follow-up  Thank you for allowing the Palliative Medicine Team to assist in the care of this patient.  Time Total: 40 min.   Visit consisted of counseling and education dealing with the complex and emotionally intense issues of symptom management and palliative care in the setting of serious and potentially life-threatening illness.Greater than 50%  of this time was spent counseling and coordinating care related to the above assessment and plan.  Alda Lea, AGPCNP-BC  Palliative Medicine Team [phone]

## 2020-11-09 NOTE — Progress Notes (Signed)
Patient is postop day 11 status post right above-knee amputation.  He is doing well sitting comfortably in bed   Wound VAC was removed today.  He has well apposed wound edges.  Staples and sutures in place.  No erythema no soft tissue swelling no signs of necrosis no ascending cellulitis.  Plan for daily dressing changes orders placed.  Should follow-up in our office in 1 week after discharge from hospital

## 2020-11-09 NOTE — Progress Notes (Signed)
PT Cancellation Note  Patient Details Name: Patrick Anthony MRN: [id number] DOB: 07-Jul-1971   Cancelled Treatment:    Reason Eval/Treat Not Completed: (P) Other (comment) (pt working with mobility tech) Will continue efforts next date per PT POC as schedule permits.   Kara Pacer Derrell [place] 11/09/2020, 5:24 PM

## 2020-11-09 NOTE — Progress Notes (Addendum)
[facility] Progress Note   Subjective:  Seen in room - did well with HD yesterday. breathing slightly improved, still not 100%. No CP or leg/phantom pains today. C/o muscle twitching at times.  Objective Vitals:   11/09/20 0300 11/09/20 0400 11/09/20 0755 11/09/20 1134  BP: 106/84  127/83 109/81  Pulse: 87  84 86  Resp: '16  19 18  '$ Temp: 98.3 F (36.8 C)  97.6 F (36.4 C) 98 F (36.7 C)  TempSrc: Oral  Oral Oral  SpO2: 100%  99% 99%  Weight:  73 kg    Height:       Physical Exam General: Well appearing man, NAD. Less periorbital edema today. Heart: RRR; no murmur. healed incision to R chest Lungs: CTAB; no wheezing Abdomen: soft, non-tender Extremities: R AKA with wound vac in place, no LLE edema Dialysis Access: LUE AVG + bruit  Additional Objective Labs: Basic Metabolic Panel: Recent Labs  Lab 11/07/20 0226 11/08/20 0500 11/09/20 0317  NA 129* 127* 131*  K 4.5 5.9* 3.9  CL 91* 90* 92*  CO2 '26 22 26  '$ GLUCOSE 101* 91 110*  BUN 34* 50* 29*  CREATININE 6.13* 8.15* 5.57*  CALCIUM 7.8* 7.6* 7.9*  PHOS 4.9* 6.0* 4.1   Liver Function Tests: Recent Labs  Lab 11/06/20 0150 11/07/20 0226 11/08/20 0500 11/09/20 0317  AST 24  --   --   --   ALT 8  --   --   --   ALKPHOS 53  --   --   --   BILITOT 0.6  --   --   --   PROT 7.2  --   --   --   ALBUMIN 1.9* 2.0* 2.1* 2.1*   CBC: Recent Labs  Lab 11/05/20 0204 11/06/20 0150 11/07/20 0226 11/07/20 1516 11/08/20 0500 11/09/20 0317  WBC 15.1* 15.0* 15.1*  --  16.6* 15.8*  NEUTROABS 10.8* 10.0* 10.0*  --  11.2* 10.2*  HGB 7.4* 7.2* 7.0* 8.2* 7.9* 9.0*  HCT 24.1* 23.7* 23.0* 26.6* 25.3* 29.4*  MCV 88.3 90.1 87.8  --  87.2 88.0  PLT 431* 462* 478*  --  547* 590*   CBG: Recent Labs  Lab 11/03/20 1206  GLUCAP 83   Medications: . sodium chloride    . sodium chloride 10 mL/hr at 10/29/20 1351  . sodium chloride    . methocarbamol (ROBAXIN) IV    . vancomycin 750 mg (11/08/20 1044)   .  (feeding supplement) PROSource Plus  30 mL Oral BID BM  . aspirin EC  325 mg Oral Daily  . calcium acetate  667 mg Oral TID WC  . Chlorhexidine Gluconate Cloth  6 each Topical Q0600  . Chlorhexidine Gluconate Cloth  6 each Topical Q0600  . cinacalcet  60 mg Oral Q breakfast  . cloNIDine  0.3 mg Transdermal Weekly  . darbepoetin (ARANESP) injection - DIALYSIS  200 mcg Intravenous Q Mon-HD  . docusate sodium  100 mg Oral BID  . enoxaparin (LOVENOX) injection  30 mg Subcutaneous Q24H  . feeding supplement (NEPRO CARB STEADY)  237 mL Oral TID BM  . influenza vac split quadrivalent PF  0.5 mL Intramuscular Tomorrow-1000  . methocarbamol  500 mg Oral TID  . metoprolol tartrate  50 mg Oral BID  . multivitamin  1 tablet Oral QHS  . oxyCODONE  15 mg Oral Q12H  . oxymetazoline  1 spray Each Nare BID  . pantoprazole  40 mg Oral Daily  .  pregabalin  75 mg Oral QHS  . senna-docusate  1 tablet Oral BID  . sodium chloride flush  3 mL Intravenous Q12H    Dialysis Orders: [facility]WF [facility]  4h 350/500 EDW 82.5 (seems ~70kg now)2/2.5 bath LUA AVGHep 5000 - Aranesp 60mgIVq 2wks - last 09/27/20 -Hectorol311m IV qHD   Assessment/Plan: 1. 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 S/p tooth extraction 10/12/20, followed by MVR (bioprosthetic) on 3/10. Continues on 6 week course of Vancomycin - end date 11/25/20. 2. 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 3. ESRD: Continue HD per MWF schedule - next 4/6. 4. HTN/volume: BP controlled but with persistent mild dyspnea. CXR 4/1 with small B effusions, need to incrementally lower EDW. 5. Anemia of ESRD: Hgb 9 - Continue Aranesp 20056mq Monday. 6. Secondary hyperparathyroidism: CorrCa and Phos to goal, continue Phoslo/Sensipar. 7. Nutrition: Alb low, continue protein supplements. 8. Hx Hep C/liver cirrhosis 9. Dispo - awaiting SNF placement  KatVeneta PentonA-C 11/09/2020, 11:43 AM  [facility]  Pt seen, examined and agree w assess/plan as above with additions as indicated.  [facility] 11/09/2020, 3:10 PM

## 2020-11-09 NOTE — TOC Progression Note (Addendum)
Transition of Care [facility]) - Progression Note    Patient Details  Name: Patrick Anthony MRN: [id number] Date of Birth: [birth year]  Transition of Care [facility]) CM/SW [place], RN Phone Number: 11/09/2020, 9:22 AM  Clinical Narrative:    Case management spoke with Dallie Dad, CM at [facility] and the facility has an available SNF bed for the patient, once insurance authorization is verified. Dallie Dad, CM at [facility] is aware that authorization was started this morning.  I spoke with CM at [facility] this morning and pre-authorization was started pre-authorization form and appropriate clinicals were faxed to [facility] at [phone] with Urgent noted on the fax transmittal.  [employer] CM stated that Urgent authorization should take 24-48 hours to process the request as opposed to 14 days approval time.    CM and MSW with DTP Team will continue to follow the patient for SNF placement at [facility].  11/09/2020 1237 - CM spoke with Caren Hazy, CM at [facility] and the patient has been approved for SNF placement at [facility].  The patient has been approved for 2 weeks - Auth # [id number] from 4/5 - 4/18.  [place] can send additional clinicals to [employer] # [phone], cell [phone].  [place] [facility] was called and message was left with Dallie Dad, CM at facility to notify that authorization has been approved.   Expected Discharge Plan: [place] Barriers to Discharge: Continued Medical Work up  Expected Discharge Plan and Services Expected Discharge Plan: [place] In-house Referral: Clinical Social Work Discharge Planning Services: CM Consult Post Acute Care Choice: [place] Living arrangements for the past 2 months: Apartment                                       Social Determinants of Health (SDOH) Interventions    Readmission Risk  Interventions Readmission Risk Prevention Plan 11/05/2020  Transportation Screening Complete  Medication Review [employer]) Complete  PCP or Specialist appointment within 3-5 days of discharge Complete  HRI or Home Care Consult Complete  SW Recovery Care/Counseling Consult Complete  Palliative Care Screening Complete  Skilled Nursing Facility Complete  Some recent data might be hidden

## 2020-11-09 NOTE — Progress Notes (Addendum)
[facility] PROGRESS NOTE  Patrick Anthony [id number] DOB: 07-11-1971 DOA: 09/27/2020 PCP: [name], MD  Status: Remains inpatient appropriate because:Ongoing active pain requiring inpatient pain management and Unsafe d/c plan   Dispo: The patient is from: Home              Anticipated d/c is to: SNF-hopefully within the next 24 to 48 hours noting now has less insurance authorization for [facility], [place]              Patient currently is medically stable to d/c. 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  Seen by ID managed with IV antibiotic, CT surgery consult salt was obtained subsequently underwent minimally invasive mitral valve replacement on 10/14/2020 and was managed under CT surgery service and subsequently transferred to [facility] on 3/16.  Patient was followed by ID, orthopedics.  He was found to have swelling on the right leg and seen by orthopedic underwent debridement on the right leg and treated for large complicated abscess with osteomyelitis involving his tibia with 7 by X.3 centimeter area of bone that was excised. Patient was seen by Dr. Sharol Given orthopedic ID and the right above-the-knee amputation has been recommended moving forward to manage this infection. Patient has been told by multiple providers  multiple times the need for the above-the-knee amputation to avoid seeding of his new valve and his graft but has been reluctant and he is aware about dying from staph aureus bacteremia and sepsis. 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  Reports significant improvement in pain with removal of wound VAC.  Looking forward to progressing with mobility and rehab so can return home to hopefully function independently  Objective: Vitals:   11/09/20 0300 11/09/20 0755  BP: 106/84 127/83  Pulse: 87 84  Resp: 16 19  Temp: 98.3 F (36.8 C) 97.6 F (36.4 C)  SpO2: 100% 99%    Intake/Output Summary (Last 24 hours) at 11/09/2020 0825 Last data filed at 11/08/2020 1659 Gross per 24 hour  Intake --  Output 3500 ml  Net -3500 ml   Filed Weights   11/08/20 0745 11/08/20 1159 11/09/20 0400  Weight: 75 kg 71.2 kg 73 kg    Exam:  Constitutional: Alert, calm, no acute distress Respiratory: Anterior lung sounds clear to auscultation, room air Cardiovascular: Heart sounds are S1-S2, pulse regular, skin warm and dry Abdomen:  LBM 4/4, normoactive bowel sounds, nontender.  Eating well Musculoskeletal: s/p R AKA -dressing clean dry and intact.  Not removed.  Patient states still has staples in place. Neurologic: CN 2-12 grossly intact. Sensation intact, DTR normal. Strength 5/5 x all 4 extremities.  Psychiatric: Alert and oriented x3, pleasant affect.   Assessment/Plan: Acute problems: MRSA bacteremia/MRSA mitral valve endocarditis with  PFO with septic emboli to the brain and LUL cavitary lung mass  -S/p extraction of multiple teeth -Status post mitral valve replacement (bioprosthetic) and closure of the PFO 3/10 Cont vancomycin w/ HD- duration 10/14/20 > 11/25/2020 as per ID. Repeat blood cultures from 10/13/20 are negative. -WBCs trending downward from a peak of 21.8 on 3/26  and as of 3/31 14.1 -Had additional fever 4/1- blood cultures negative. No further fevers.  Large complicated abscess with osteomyelitis involving his right tibia- S/P Rt AKA 3/25 -Followed by ID, vascular, CT surgery and Dr. Sharol Given  -Patient initially was very reluctant to proceed with AKA but given clinical status eventually agreed -We will discontinue Neurontin in favor of Lyrica 75 mg at at bedtime -Continue OxyContin 15 mg every 12 hours  -4/4 discontinued IV Dilaudid  -Continue p.o. Dilaudid for breakthrough pain -Continue Robaxin 500 3 times daily    Status post right AKA continue pain management and wound care as above, wound VAC in place-with bloody drainage likely contributing to anemia.  Incision (Closed) 10/14/20 Groin Right (Active)  Date First Assessed/Time First Assessed: 10/14/20 1132   Location: Groin  Location Orientation: Right    Assessments 10/16/2020  8:00 PM 11/08/2020  8:08 PM  Dressing Type None None  Site / Wound Assessment Clean;Dry Clean;Dry  Margins Attached edges (approximated) Attached edges (approximated)  Closure -- Skin glue;Approximated  Drainage Amount None None  Treatment Other (Comment) --     No Linked orders to display     Incision (Closed) 10/14/20 Chest Right (Active)  Date First Assessed/Time First Assessed: 10/14/20 1134   Location: Chest  Location Orientation: Right    Assessments 10/14/2020  2:15 PM 11/08/2020  8:08 PM  Dressing Type Silver hydrofiber Liquid skin adhesive  Dressing Clean;Dry;Intact --  Dressing Change Frequency Other (Comment) PRN  Site / Wound Assessment Dressing in place / Unable to assess Clean;Dry  Margins -- Attached edges (approximated)  Closure -- Skin glue;Approximated  Drainage Amount -- None  Treatment Other (Comment) --     No Linked orders to display     Incision (Closed) 10/20/20 Leg Right (Active)  Date First Assessed/Time First Assessed: 10/20/20 1656   Location: Leg  Location Orientation: Right     Assessments 10/21/2020  8:30 AM 11/08/2020  1:00 PM  Dressing Type Compression wrap Negative pressure wound therapy  Dressing Clean;Dry;Intact Clean;Dry;Intact  Site / Wound Assessment Dressing in place / Unable to assess Dressing in place / Unable to assess  Drainage Amount -- Minimal  Drainage Description -- Sanguineous     No Linked orders to display     Incision (Closed) 10/29/20 Knee Right (Active)  Date First Assessed/Time First Assessed: 10/29/20 1341   Location: Knee  Location Orientation: Right    Assessments 10/29/2020  3:00 PM 11/08/2020  8:08 PM  Dressing Type Other (Comment) Negative pressure wound therapy  Dressing Clean;Dry;Intact Clean;Dry;Intact  Site / Wound Assessment Dressing in place / Unable to assess Dressing in place / Unable to assess  Drainage Amount None --     No Linked orders to display     Negative Pressure Wound Therapy Knee Right (Active)  Placement Date/Time: 10/29/20 1439   Wound Type: Surgical (Open wound)  Location: Knee  Location Orientation: Right    Assessments 10/29/2020  3:00 PM 11/08/2020  8:08 PM  Site / Wound Assessment Dressing in place / Unable to assess Dressing in place / Unable to assess  Peri-wound Assessment -- Intact  Cycle -- Continuous  Target Pressure (mmHg) 125 125  Dressing Status -- Intact  Output (mL) 0 mL --     No Linked orders to display    ESRD and HD MWF:  -Appreciate nephrology assistance  Acute blood loss anemia in the setting of surgery, and wound VAC bloody draiange/Anemia of chronic kidney disease -S/p 3 units PRBC -Continue aranesp and dose was increased.   -No iron due to acute infection.   -Hemoglobin 7.1 as of 3/31-during the early portion of hospitalization patient's hemoglobin has ranged between 8.9 and 10.0.  He may benefit from additional transfusion..  Hypertension  -Continue metoprolol and clonidine patch as well as regular hemodialysis.  Nutrition Status: Nutrition Problem: Increased nutrient  needs Etiology: chronic illness (ESRD on HD) Signs/Symptoms: estimated needs Interventions: Nepro shake,Refer to RD note for recommendations Estimated body mass index is 21.82 kg/m as calculated from the following:   Height as of this encounter: 6' 0.01" (1.829 m).   Weight as of this encounter: 73 kg.   Other problems: Hyperkalemia:  -Resolved  Secondary hyperparathyroidism -continue Sensipar binders and Hectorol per nephrology.   History of hepatitis C/liver cirrhosis -outpatient follow-up with ID. -Monitor LFTs intermittently.    Hyponatremia  -Not unexpected in dialysis patient and can be managed with dialysate per nephrology   Nasal bleeding  -3/20 resolved.  Data Reviewed: Basic Metabolic Panel: Recent Labs  Lab 11/05/20 0204 11/06/20 0150 11/07/20 0226 11/08/20 0500 11/09/20 0317  NA 130* 130* 129* 127* 131*  K 3.8 4.0 4.5 5.9* 3.9  CL 93* 93* 91* 90* 92*  CO2 '25 28 26 22 26  '$ GLUCOSE 99 125* 101* 91 110*  BUN 31* 21* 34* 50* 29*  CREATININE 6.63* 4.53* 6.13* 8.15* 5.57*  CALCIUM 8.1* 7.7* 7.8* 7.6* 7.9*  PHOS 4.4 3.2 4.9* 6.0* 4.1   Liver Function Tests: Recent Labs  Lab 11/05/20 0204 11/06/20 0150 11/07/20 0226 11/08/20 0500 11/09/20 0317  AST  --  24  --   --   --   ALT  --  8  --   --   --   ALKPHOS  --  53  --   --   --   BILITOT  --  0.6  --   --   --   PROT  --  7.2  --   --   --   ALBUMIN 2.0* 1.9* 2.0* 2.1* 2.1*   No results for input(s): LIPASE, AMYLASE in the last 168 hours. No results for input(s): AMMONIA in the last 168 hours. CBC: Recent Labs  Lab 11/05/20 0204 11/06/20 0150 11/07/20 0226 11/07/20 1516 11/08/20 0500 11/09/20 0317  WBC 15.1* 15.0* 15.1*  --  16.6* 15.8*  NEUTROABS 10.8* 10.0* 10.0*  --  11.2* 10.2*  HGB 7.4* 7.2* 7.0* 8.2* 7.9* 9.0*  HCT 24.1* 23.7* 23.0* 26.6* 25.3* 29.4*  MCV 88.3 90.1 87.8  --  87.2 88.0  PLT 431* 462* 478*  --  547* 590*   Cardiac Enzymes: No results for input(s): CKTOTAL, CKMB,  CKMBINDEX, TROPONINI in the last 168 hours. BNP (last 3 results) No results for input(s): BNP in the last 8760 hours.  ProBNP (last 3 results) No results for input(s): PROBNP in the last 8760 hours.  CBG: Recent Labs  Lab 11/03/20 1206  GLUCAP 83    Recent Results (from the past 240 hour(s))  Culture, blood (routine x 2)     Status: None (Preliminary result)   Collection Time: 11/05/20  4:32 PM   Specimen:  BLOOD RIGHT HAND  Result Value Ref Range Status   Specimen Description BLOOD RIGHT HAND  Final   Special Requests   Final    BOTTLES DRAWN AEROBIC ONLY Blood Culture results may not be optimal due to an inadequate volume of blood received in culture bottles   Culture   Final    NO GROWTH 2 DAYS Performed at [facility], [place] [address], [place], [place] [address]    Report Status PENDING  Incomplete  Culture, blood (routine x 2)     Status: None (Preliminary result)   Collection Time: 11/05/20  4:45 PM   Specimen: BLOOD RIGHT HAND  Result Value Ref Range Status   Specimen Description BLOOD RIGHT HAND  Final   Special Requests   Final    BOTTLES DRAWN AEROBIC ONLY Blood Culture results may not be optimal due to an inadequate volume of blood received in culture bottles   Culture   Final    NO GROWTH 2 DAYS Performed at [facility], [place] [address] [place], [place] [address]    Report Status PENDING  Incomplete     Studies: No results found.  Scheduled Meds: . (feeding supplement) PROSource Plus  30 mL Oral BID BM  . aspirin EC  325 mg Oral Daily  . calcium acetate  667 mg Oral TID WC  . Chlorhexidine Gluconate Cloth  6 each Topical Q0600  . Chlorhexidine Gluconate Cloth  6 each Topical Q0600  . cinacalcet  60 mg Oral Q breakfast  . cloNIDine  0.3 mg Transdermal Weekly  . darbepoetin (ARANESP) injection - DIALYSIS  200 mcg Intravenous Q Mon-HD  . docusate sodium  100 mg Oral BID  . enoxaparin (LOVENOX) injection  30 mg Subcutaneous Q24H  . feeding  supplement (NEPRO CARB STEADY)  237 mL Oral TID BM  . influenza vac split quadrivalent PF  0.5 mL Intramuscular Tomorrow-1000  . methocarbamol  500 mg Oral TID  . metoprolol tartrate  50 mg Oral BID  . multivitamin  1 tablet Oral QHS  . oxyCODONE  15 mg Oral Q12H  . oxymetazoline  1 spray Each Nare BID  . pantoprazole  40 mg Oral Daily  . pregabalin  75 mg Oral QHS  . senna-docusate  1 tablet Oral BID  . sodium chloride flush  3 mL Intravenous Q12H   Continuous Infusions: . sodium chloride    . sodium chloride 10 mL/hr at 10/29/20 1351  . sodium chloride    . methocarbamol (ROBAXIN) IV    . vancomycin 750 mg (11/08/20 1044)    Principal Problem:   Bacterial endocarditis Active Problems:   Chronic combined systolic and diastolic congestive heart failure (HCC)   HTN (hypertension)   Anemia in chronic kidney disease   Hepatitis C   ESRD on dialysis (HCC)   End stage renal disease (HCC)   Chest pain   Sepsis ([facility])   Pneumonia due to infectious agent   ADPKD (autosomal dominant polycystic kidney disease)   Sepsis due to pneumonia (HCC)   Lactic acidosis   Cerebral embolism with cerebral infarction   Bacteremia due to Staphylococcus aureus   Mitral regurgitation   Uncontrolled hypertension   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   Patent foramen ovale   Dental caries   Chronic apical periodontitis   Chronic periodontitis   Accretions on teeth   S/P minimally-invasive mitral valve replacement with bioprosthetic valve   S/P mitral valve repair   Abscess  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)   Abscess of right leg   S/P mitral valve replacement   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)   S/P AKA (above knee amputation), right (HCC)   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   Fever   Consultants:  Neurology  Infectious disease  Nephrology  Cardiothoracic surgery  Orthopedic surgery  CIR  physician  Palliative medicine  Procedures:  Echocardiogram with bubble study  Continuous EEG  TEE  Intraoperative TEE  Follow-up echocardiogram  Left heart catheterization  Multiple dental extractions with alveoloplasty  Minimally invasive mitral valve replacement using Medtronic Mosaic  Irrigation and debridement of leg  Right above-the-knee amputation  Antibiotics: Anti-infectives (From admission, onward)   Start     Dose/Rate Route Frequency Ordered Stop   10/30/20 1100  vancomycin (VANCOCIN) IVPB 750 mg/150 ml premix        750 mg 150 mL/hr over 60 Minutes Intravenous Once in dialysis 10/30/20 0833 10/30/20 1317   10/29/20 1000  ceFAZolin (ANCEF) IVPB 2g/100 mL premix  Status:  Discontinued        2 g 200 mL/hr over 30 Minutes Intravenous To Short Stay 10/29/20 0730 10/29/20 1634   10/27/20 2015  vancomycin (VANCOCIN) IVPB 750 mg/150 ml premix        750 mg 150 mL/hr over 60 Minutes Intravenous Every M-W-F (Hemodialysis) 10/27/20 2005     10/25/20 0922  vancomycin (VANCOCIN) 500-5 MG/100ML-% IVPB       Note to Pharmacy: Kalman Shan   : cabinet override      10/25/20 0922 10/25/20 0958   10/21/20 0600  ceFAZolin (ANCEF) IVPB 2g/100 mL premix        2 g 200 mL/hr over 30 Minutes Intravenous On call to O.R. 10/20/20 1349 10/21/20 0650   10/20/20 2243  vancomycin (VANCOCIN) 500-5 MG/100ML-% IVPB       Note to Pharmacy: Wallace Cullens   : cabinet override      10/20/20 2243 10/20/20 2243   10/20/20 1437  ceFAZolin (ANCEF) 2-4 GM/100ML-% IVPB       Note to Pharmacy: Ladoris Gene   : cabinet override      10/20/20 1437 10/20/20 1612   10/20/20 1200  vancomycin (VANCOREADY) IVPB 500 mg/100 mL  Status:  Discontinued        500 mg 100 mL/hr over 60 Minutes Intravenous Every M-W-F (Hemodialysis) 10/20/20 0903 10/27/20 2005   10/18/20 1124  vancomycin variable dose per unstable renal function (pharmacist dosing)  Status:  Discontinued         Does not apply See  admin instructions 10/18/20 1124 10/20/20 0903   10/16/20 1600  vancomycin (VANCOREADY) IVPB 500 mg/100 mL  Status:  Discontinued        500 mg 100 mL/hr over 60 Minutes Intravenous Every Sat (Hemodialysis) 10/16/20 1306 10/16/20 1312   10/15/20 1500  vancomycin (VANCOCIN) IVPB 500 mg/100 ml premix  Status:  Discontinued        500 mg 100 mL/hr over 60 Minutes Intravenous Every M-W-F (Hemodialysis) 10/15/20 1357 10/17/20 0942   10/14/20 2130  vancomycin (VANCOCIN) IVPB 1000 mg/200 mL premix  Status:  Discontinued        1,000 mg 200 mL/hr over 60 Minutes Intravenous  Once 10/14/20 1403 10/14/20 1547   10/14/20 2130  vancomycin (VANCOREADY) IVPB 500 mg/100 mL        500 mg 100 mL/hr over 60 Minutes Intravenous  Once 10/14/20  1547 10/14/20 2222   10/14/20 1730  cefUROXime (ZINACEF) 1.5 g in sodium chloride 0.9 % 100 mL IVPB        1.5 g 200 mL/hr over 30 Minutes Intravenous Every 24 hr x 2 10/14/20 1403 10/15/20 1718   10/14/20 1128  vancomycin (VANCOCIN) 1,000 mg in sodium chloride 0.9 % 1,000 mL irrigation  Status:  Discontinued          As needed 10/14/20 1128 10/14/20 1401   10/14/20 0400  vancomycin (VANCOREADY) IVPB 1250 mg/250 mL        1,250 mg 166.7 mL/hr over 90 Minutes Intravenous To Surgery 10/13/20 1039 10/14/20 0910   10/14/20 0400  cefUROXime (ZINACEF) 1.5 g in sodium chloride 0.9 % 100 mL IVPB        1.5 g 200 mL/hr over 30 Minutes Intravenous To Surgery 10/13/20 1039 10/14/20 0835   10/14/20 0400  cefUROXime (ZINACEF) 750 mg in sodium chloride 0.9 % 100 mL IVPB        750 mg 200 mL/hr over 30 Minutes Intravenous To Surgery 10/13/20 1039 10/14/20 1250   10/14/20 0400  vancomycin (VANCOCIN) 1,000 mg in sodium chloride 0.9 % 1,000 mL irrigation  Status:  Discontinued         Irrigation To Surgery 10/13/20 1039 10/14/20 1403   10/13/20 1657  vancomycin (VANCOCIN) 500-5 MG/100ML-% IVPB       Note to Pharmacy: Judieth Keens  : cabinet override      10/13/20 1657 10/13/20  1717   10/12/20 0600  ceFAZolin (ANCEF) IVPB 2g/100 mL premix        2 g 200 mL/hr over 30 Minutes Intravenous On call to O.R. 10/11/20 1841 10/12/20 1315   10/11/20 1415  vancomycin (VANCOCIN) IVPB 500 mg/100 ml premix  Status:  Discontinued        500 mg 100 mL/hr over 60 Minutes Intravenous Every M-W-F (Hemodialysis) 10/11/20 1413 10/14/20 1403   10/11/20 1413  vancomycin (VANCOCIN) 500-5 MG/100ML-% IVPB       Note to Pharmacy: Kalman Shan   : cabinet override      10/11/20 1413 10/12/20 0229   10/11/20 1353  Vancomycin (VANCOCIN) 750-5 MG/150ML-% IVPB  Status:  Discontinued       Note to Pharmacy: Kalman Shan   : cabinet override      10/11/20 1353 10/11/20 1417   10/08/20 1200  vancomycin (VANCOCIN) IVPB 750 mg/150 ml premix  Status:  Discontinued        750 mg 150 mL/hr over 60 Minutes Intravenous Every M-W-F (Hemodialysis) 10/05/20 1404 10/11/20 1413   10/06/20 1619  vancomycin (VANCOCIN) 500-5 MG/100ML-% IVPB       Note to Pharmacy: Judieth Keens  : cabinet override      10/06/20 1619 10/06/20 1736   10/06/20 1200  vancomycin (VANCOCIN) IVPB 750 mg/150 ml premix  Status:  Discontinued        750 mg 150 mL/hr over 60 Minutes Intravenous Every M-W-F (Hemodialysis) 10/04/20 0905 10/05/20 1404   10/06/20 1200  vancomycin (VANCOCIN) IVPB 500 mg/100 ml premix        500 mg 100 mL/hr over 60 Minutes Intravenous Every Wed (Hemodialysis) 10/05/20 1404 10/06/20 2000   10/01/20 2000  DAPTOmycin (CUBICIN) 840 mg in sodium chloride 0.9 % IVPB  Status:  Discontinued        840 mg 233.6 mL/hr over 30 Minutes Intravenous Every 48 hours 10/01/20 0922 10/01/20 1056   10/01/20 1200  vancomycin (VANCOCIN) IVPB 1000 mg/200  mL premix  Status:  Discontinued        1,000 mg 200 mL/hr over 60 Minutes Intravenous Every M-W-F (Hemodialysis) 10/01/20 1056 10/04/20 0845   09/29/20 1200  vancomycin (VANCOCIN) IVPB 1000 mg/200 mL premix  Status:  Discontinued        1,000 mg 200 mL/hr over 60  Minutes Intravenous Every M-W-F (Hemodialysis) 09/27/20 1443 10/01/20 0922   09/29/20 1200  ceFEPIme (MAXIPIME) 2 g in sodium chloride 0.9 % 100 mL IVPB  Status:  Discontinued        2 g 200 mL/hr over 30 Minutes Intravenous Every M-W-F (Hemodialysis) 09/27/20 1443 09/28/20 1054   09/28/20 0900  metroNIDAZOLE (FLAGYL) IVPB 500 mg  Status:  Discontinued        500 mg 100 mL/hr over 60 Minutes Intravenous Every 12 hours 09/27/20 1808 09/28/20 1054   09/28/20 0900  ceFEPIme (MAXIPIME) 1 g in sodium chloride 0.9 % 100 mL IVPB  Status:  Discontinued        1 g 200 mL/hr over 30 Minutes Intravenous Every 12 hours 09/27/20 1808 09/27/20 1812   09/27/20 1445  vancomycin (VANCOREADY) IVPB 2000 mg/400 mL        2,000 mg 200 mL/hr over 120 Minutes Intravenous  Once 09/27/20 1433 09/27/20 2330   09/27/20 1430  ceFEPIme (MAXIPIME) 2 g in sodium chloride 0.9 % 100 mL IVPB        2 g 200 mL/hr over 30 Minutes Intravenous  Once 09/27/20 1418 09/27/20 1541   09/27/20 1430  metroNIDAZOLE (FLAGYL) IVPB 500 mg        500 mg 100 mL/hr over 60 Minutes Intravenous  Once 09/27/20 1418 09/27/20 2214   09/27/20 1430  vancomycin (VANCOREADY) IVPB 1750 mg/350 mL  Status:  Discontinued        1,750 mg 175 mL/hr over 120 Minutes Intravenous  Once 09/27/20 1418 09/27/20 1433       Time spent: 25 minutes    Erin Hearing ANP  [facility] 7 am - 330 pm/M-F for direct patient care and secure chat Please refer to Amion for contact info 43  days

## 2020-11-09 NOTE — Progress Notes (Signed)
Mobility Specialist: Progress Note   11/09/20 1614  Mobility  Activity Ambulated in room  Level of Assistance Contact guard assist, steadying assist  Assistive Device Front wheel walker  Distance Ambulated (ft) 100 ft  Mobility Response Tolerated well  Mobility performed by Mobility specialist  $Mobility charge 1 Mobility   Pre-Mobility: 87 HR  Pt c/o 3/10 pain in R stump during ambulation but was otherwise asx. Pt ambulated to the door and back x3 and then went to the BR and back. Pt sitting EOB per request with call bell at his side.   [facility] Aaran Enberg Mobility Specialist Mobility Specialist Phone: [phone]

## 2020-11-09 NOTE — Progress Notes (Signed)
Per [facility] CSW, [place] cannot accommodate patient at his normal seat time. Navigator contacted [place] clinic staff to discuss. They are able to offer patient the same seat time as another patient who is transported from [place] to [place] clinic on MWF schedule with a seat time of 7:00am. He should arrive at 7:45am. Patient will return to his regular seat time when he discharges from SNF. Navigator updated CSW of above.   Alphonzo Cruise, [place]  Renal Navigator [phone]

## 2020-11-10 DIAGNOSIS — Z992 Dependence on renal dialysis: Secondary | ICD-10-CM | POA: Diagnosis not present

## 2020-11-10 DIAGNOSIS — N2581 Secondary hyperparathyroidism of renal origin: Secondary | ICD-10-CM | POA: Diagnosis not present

## 2020-11-10 DIAGNOSIS — N186 End stage renal disease: Secondary | ICD-10-CM | POA: Diagnosis not present

## 2020-11-10 DIAGNOSIS — I5032 Chronic diastolic (congestive) heart failure: Secondary | ICD-10-CM | POA: Diagnosis not present

## 2020-11-10 DIAGNOSIS — I132 Hypertensive heart and chronic kidney disease with heart failure and with stage 5 chronic kidney disease, or end stage renal disease: Secondary | ICD-10-CM | POA: Diagnosis not present

## 2020-11-10 DIAGNOSIS — D631 Anemia in chronic kidney disease: Secondary | ICD-10-CM | POA: Diagnosis not present

## 2020-11-10 DIAGNOSIS — R0602 Shortness of breath: Secondary | ICD-10-CM | POA: Diagnosis not present

## 2020-11-10 DIAGNOSIS — A419 Sepsis, unspecified organism: Secondary | ICD-10-CM | POA: Diagnosis not present

## 2020-11-10 DIAGNOSIS — I639 Cerebral infarction, unspecified: Secondary | ICD-10-CM | POA: Diagnosis not present

## 2020-11-10 DIAGNOSIS — R531 Weakness: Secondary | ICD-10-CM | POA: Diagnosis not present

## 2020-11-10 DIAGNOSIS — J189 Pneumonia, unspecified organism: Secondary | ICD-10-CM | POA: Diagnosis not present

## 2020-11-10 DIAGNOSIS — I330 Acute and subacute infective endocarditis: Secondary | ICD-10-CM | POA: Diagnosis not present

## 2020-11-10 LAB — CULTURE, BLOOD (ROUTINE X 2)
Culture: NO GROWTH
Culture: NO GROWTH

## 2020-11-10 LAB — RENAL FUNCTION PANEL
Albumin: 2.1 g/dL — ABNORMAL LOW (ref 3.5–5.0)
Anion gap: 12 (ref 5–15)
BUN: 37 mg/dL — ABNORMAL HIGH (ref 6–20)
CO2: 24 mmol/L (ref 22–32)
Calcium: 7.9 mg/dL — ABNORMAL LOW (ref 8.9–10.3)
Chloride: 94 mmol/L — ABNORMAL LOW (ref 98–111)
Creatinine, Ser: 7.04 mg/dL — ABNORMAL HIGH (ref 0.61–1.24)
GFR, Estimated: 9 mL/min — ABNORMAL LOW (ref >60)
Glucose, Bld: 97 mg/dL (ref 70–99)
Phosphorus: 4.1 mg/dL (ref 2.5–4.6)
Potassium: 4.3 mmol/L (ref 3.5–5.1)
Sodium: 130 mmol/L — ABNORMAL LOW (ref 135–145)

## 2020-11-10 LAB — CBC WITH DIFFERENTIAL/PLATELET
Abs Immature Granulocytes: 0.43 10*3/uL — ABNORMAL HIGH (ref 0.00–0.07)
Basophils Absolute: 0.1 10*3/uL (ref 0.0–0.1)
Basophils Relative: 1 %
Eosinophils Absolute: 1.4 10*3/uL — ABNORMAL HIGH (ref 0.0–0.5)
Eosinophils Relative: 8 %
HCT: 27.8 % — ABNORMAL LOW (ref 39.0–52.0)
Hemoglobin: 8.4 g/dL — ABNORMAL LOW (ref 13.0–17.0)
Immature Granulocytes: 2 %
Lymphocytes Relative: 8 %
Lymphs Abs: 1.5 10*3/uL (ref 0.7–4.0)
MCH: 26.9 pg (ref 26.0–34.0)
MCHC: 30.2 g/dL (ref 30.0–36.0)
MCV: 89.1 fL (ref 80.0–100.0)
Monocytes Absolute: 2.6 10*3/uL — ABNORMAL HIGH (ref 0.1–1.0)
Monocytes Relative: 14 %
Neutro Abs: 12.3 10*3/uL — ABNORMAL HIGH (ref 1.7–7.7)
Neutrophils Relative %: 67 %
Platelets: 540 10*3/uL — ABNORMAL HIGH (ref 150–400)
RBC: 3.12 MIL/uL — ABNORMAL LOW (ref 4.22–5.81)
RDW: 17.9 % — ABNORMAL HIGH (ref 11.5–15.5)
WBC: 18.4 10*3/uL — ABNORMAL HIGH (ref 4.0–10.5)
nRBC: 0.5 % — ABNORMAL HIGH (ref 0.0–0.2)

## 2020-11-10 LAB — VANCOMYCIN, RANDOM: Vancomycin Rm: 20

## 2020-11-10 LAB — GLUCOSE, CAPILLARY: Glucose-Capillary: 116 mg/dL — ABNORMAL HIGH (ref 70–99)

## 2020-11-10 MED ORDER — VANCOMYCIN HCL IN DEXTROSE 750-5 MG/150ML-% IV SOLN
INTRAVENOUS | Status: AC
  Administered 2020-11-10: 750 mg via INTRAVENOUS
  Filled 2020-11-10: qty 150

## 2020-11-10 MED ORDER — ACETAMINOPHEN 325 MG PO TABS
ORAL_TABLET | ORAL | Status: AC
  Filled 2020-11-10: qty 2

## 2020-11-10 NOTE — Progress Notes (Signed)
[facility] Progress Note   Subjective:  Seen on HD - 4L UFG and tolerating. No CP or dyspnea at the moment. Wound vac is off R stump. Per notes, looks like has been accepted to SNF/rehab.  Objective Vitals:   11/10/20 0343 11/10/20 0706 11/10/20 0717 11/10/20 0730  BP: 124/85 (!) 104/58 110/75 (!) 107/57  Pulse:  88 88 (!) 59  Resp: '18 16 15   '$ Temp: 97.7 F (36.5 C) 98.3 F (36.8 C)    TempSrc: Oral Oral    SpO2: 92% 96%    Weight: 74.9 kg 74.1 kg    Height:       Physical Exam General:Well appearing man, NAD. Minimal periorbital edema today. Heart:RRR; no murmur. Healed incision to R chest Lungs:CTAB; no wheezing Abdomen:soft, non-tender Extremities:R AKA, no LLE edema Dialysis Access:LUE AVG + bruit   Additional Objective Labs: Basic Metabolic Panel: Recent Labs  Lab 11/08/20 0500 11/09/20 0317 11/10/20 0110  NA 127* 131* 130*  K 5.9* 3.9 4.3  CL 90* 92* 94*  CO2 '22 26 24  '$ GLUCOSE 91 110* 97  BUN 50* 29* 37*  CREATININE 8.15* 5.57* 7.04*  CALCIUM 7.6* 7.9* 7.9*  PHOS 6.0* 4.1 4.1   Liver Function Tests: Recent Labs  Lab 11/06/20 0150 11/07/20 0226 11/08/20 0500 11/09/20 0317 11/10/20 0110  AST 24  --   --   --   --   ALT 8  --   --   --   --   ALKPHOS 53  --   --   --   --   BILITOT 0.6  --   --   --   --   PROT 7.2  --   --   --   --   ALBUMIN 1.9*   < > 2.1* 2.1* 2.1*   < > = values in this interval not displayed.    CBC: Recent Labs  Lab 11/06/20 0150 11/07/20 0226 11/07/20 1516 11/08/20 0500 11/09/20 0317 11/10/20 0110  WBC 15.0* 15.1*  --  16.6* 15.8* 18.4*  NEUTROABS 10.0* 10.0*  --  11.2* 10.2* 12.3*  HGB 7.2* 7.0*   < > 7.9* 9.0* 8.4*  HCT 23.7* 23.0*   < > 25.3* 29.4* 27.8*  MCV 90.1 87.8  --  87.2 88.0 89.1  PLT 462* 478*  --  547* 590* 540*   < > = values in this interval not displayed.   Medications: . sodium chloride    . methocarbamol (ROBAXIN) IV    . vancomycin Stopped (11/08/20 1145)   .  (feeding supplement) PROSource Plus  30 mL Oral BID BM  . acetaminophen      . aspirin EC  325 mg Oral Daily  . calcium acetate  667 mg Oral TID WC  . Chlorhexidine Gluconate Cloth  6 each Topical Q0600  . cinacalcet  60 mg Oral Q breakfast  . cloNIDine  0.3 mg Transdermal Weekly  . darbepoetin (ARANESP) injection - DIALYSIS  200 mcg Intravenous Q Mon-HD  . docusate sodium  100 mg Oral BID  . enoxaparin (LOVENOX) injection  30 mg Subcutaneous Q24H  . feeding supplement (NEPRO CARB STEADY)  237 mL Oral TID BM  . influenza vac split quadrivalent PF  0.5 mL Intramuscular Tomorrow-1000  . methocarbamol  500 mg Oral TID  . metoprolol tartrate  50 mg Oral BID  . multivitamin  1 tablet Oral QHS  . oxyCODONE  15 mg Oral Q12H  . oxymetazoline  1  spray Each Nare BID  . pantoprazole  40 mg Oral Daily  . pregabalin  75 mg Oral QHS  . 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 S/p tooth extraction 10/12/20, followed by MVR (bioprosthetic) on 3/10. Continues on 6 week course of Vancomycin - end date 11/25/20. 2. 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 3. ESRD:Continue HD per MWF schedule -HD today, 4L UFG. 4.HTN/volume:BP controlled but with persistent mild dyspnea. CXR 4/1 with small B effusions, need to incrementally lower EDW. 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 6. Secondary hyperparathyroidism:CorrCa and Phos to goal, continue Phoslo/Sensipar. 7. Nutrition:Alb low, continue protein supplements. 8. Hx Hep C/liver cirrhosis 9. Dispo - awaiting SNF placement  KatVeneta PentonA-Hershal Coria6/2022, 8:10 AM  [employer]

## 2020-11-10 NOTE — Progress Notes (Addendum)
Pharmacy Antibiotic Note  [facility] is a 50 y.o. male admitted on 09/27/2020 with fever, chills, and generalized weakness.  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  Continues on Vancomycin per pharmacy.  Pt has ESRD with HD MWF.   Plans are for 6 weeks of antibiotics with day 1 being post-op day 1 from valve surgery per 3/7 ID note 3/10 > (4/21). 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 Last HD 4/1, has remained on schedule.   Vancomycin level checked this morning prior to HD was at goal at 20 mcg/ml (goal 15-25 mcg/ml).  Continue with vancomycin '750mg'$  IV Q-HD. Patient currently afebrile, but WBC up to 18.   Plan: -Continue vancomycin 750 mg IV qHD -Continue following HD schedule/duration  Height: 6' 0.01" (182.9 cm) Weight: 74.9 kg (165 lb 2 oz) IBW/kg (Calculated) : 77.62  Temp (24hrs), Avg:97.9 F (36.6 C), Min:97.7 F (36.5 C), Max:98 F (36.7 C)  Recent Labs  Lab 11/06/20 0150 11/07/20 0226 11/08/20 0500 11/09/20 0317 11/10/20 0110  WBC 15.0* 15.1* 16.6* 15.8* 18.4*  CREATININE 4.53* 6.13* 8.15* 5.57* 7.04*  VANCORANDOM  --   --   --   --  20    Estimated Creatinine Clearance: 13.4 mL/min (A) (by C-G formula based on SCr of 7.04 mg/dL (H)).    No Known Allergies  Antimicrobials this admission: Vanc 2/21>> (4/21) Cefepime 2/21>>2/22 Flagyl 2/21>>2/22   Dose adjustments this admission: 2/28 Pre-HD VR 41 - estimate post HD level 27 Dose held 2/28 and reduced to '750mg'$  IV qHD starting 3/2 3/7 pre HD VR= 29; change vanc to '500mg'$  w/ HD 3/13 Post-HD VR 29 - no additional dose (did get additional dose of vanc around time of surgery) 3/16 Pre-HD VR 20 - continue with 500 IV qHD 3/17 Pre-HD VR 15 - incr to 750 IV qHD 3/30 VR 18- continue with '750mg'$  IV qHD 4/6 VR 20 - continue with 750 IV qHD  Microbiology results: 2/21 blood x 2: MRSA, sens Vanc MIC < 0.5, Septra, TCN, gent MIC < 0.5, Clinda 2/22 BCID: MRSA  2/21 COVID and flu:  negative 2/21 HIV: non-reactive 2/24 blood x 2: 1/2 SA 2/27 blood x 2: neg  Thank you for involving pharmacy in this patient's care.  Erin Hearing PharmD., BCPS Clinical Pharmacist 11/10/2020 8:06 AM  **Pharmacist phone directory can be found on [email or web address] listed under [place]**

## 2020-11-10 NOTE — Progress Notes (Signed)
[facility] at [phone] Attempted # 2.  Transferred to "the nurse" and no answer

## 2020-11-10 NOTE — Discharge Summary (Signed)
Physician Discharge Summary  Patrick Anthony [id number] DOB: [birth year] DOA: 09/27/2020  PCP: [name], MD  Admit date: 09/27/2020 Discharge date: 11/10/2020  Time spent: 45 minutes  Recommendations for Outpatient Follow-up:  1. Patient needs to follow-up with Dr. Sharol Given with orthopedic surgery in 1 week after discharge to facility.  Please call Dr. Jess Barters office take this patient to arrange for follow-up. 2. Patient will be discharging to [facility] skilled nursing to continue rehabilitative therapies in regards to improving mobility and independence in context of recent above-the-knee amputation. 3. Patient needs to continue IV vancomycin to treat recent MRSA bacteremia with endocarditis.  Last dose is due on 4/21.  Recommend follow-up blood cultures after antibiotic complete. 4. Patient is to follow-up with cardiothoracic surgeon Dr. Roxy Manns 1 week after discharge.  Please call physician office to assist patient in making follow-up appointment while at the facility. 5. Still has surgical staples in place at AKA site.  Anticipate this will be removed at follow-up visit with Dr. Sharol Given after discharge.   Discharge Diagnoses:  Principal Problem:   Bacterial endocarditis Active Problems:   Chronic combined systolic and diastolic congestive heart failure (HCC)   HTN (hypertension)   Anemia in chronic kidney disease   Hepatitis C   ESRD on dialysis (HCC)   End stage renal disease (HCC)   Chest pain   Sepsis ([place])   Pneumonia due to infectious agent   ADPKD (autosomal dominant polycystic kidney disease)   Sepsis due to pneumonia (HCC)   Lactic acidosis   Cerebral embolism with cerebral infarction   Bacteremia due to Staphylococcus aureus   Mitral regurgitation   Uncontrolled hypertension   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   Patent foramen ovale   Dental caries   Chronic apical periodontitis   Chronic periodontitis   Accretions on teeth   S/P minimally-invasive mitral  valve replacement with bioprosthetic valve   S/P mitral valve repair   Abscess   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)   Abscess of right leg   S/P mitral valve replacement   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)   S/P AKA (above knee amputation), right (HCC)   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   Fever  SEPSIS RESOLVED  Discharge Condition: Stable  Diet recommendation: Renal 100 cc fluid restriction  Filed Weights   11/09/20 0400 11/10/20 0343 11/10/20 0706  Weight: 73 kg 74.9 kg 74.1 kg    History of present illness:  50 year old male with ESRD on HD MWF with left upper extremity AV graft, OSA, chronic diastolic CHF with EF 55 to 60%, hyperlipidemia, medication noncompliance, history of liver cirrhosis secondary to hep C, history of alcohol abuse presents with generalized weakness found to have MRSA bacteremia complicated by embolic CVA likely secondary to endocarditis of the mitral valve seen in the 2D echocardiogram, follow-up TEE showed large vegetation with no evidence of intra-arterial shunt. Seen by ID managed with IV antibiotic, CT surgery consult salt was obtained subsequently underwent minimally invasive mitral valve replacement on 10/14/2020 and was managed under CT surgery service and subsequently transferred to [facility] on 3/16. Patient was followed by ID, orthopedics. He was found to have swelling on the right leg and seen by orthopedic underwent debridement on the right leg and treated for large complicated abscess with osteomyelitis involving his tibia with 7 by X.3 centimeter area of bone that was excised. Patient was seen by Dr. Sharol Given orthopedic ID and  the right above-the-knee amputation has been recommended moving forward to manage this infection. Patient has been told by multiple providers multiple times the need for the above-the-knee amputation to avoid seeding of his new valve and his graft  but has been reluctant and he is aware about dying from staph aureus bacteremia and sepsis. S/P Rt AKA on 10/29/20 by Dr Sharol Given He has had significant anemia in the setting of surgery and wound VAC with bloody drainage  Hospital Course:  Acute problems: MRSA bacteremia/MRSA mitral valve endocarditis with PFO with septic emboli to the brain and LUL cavitary lung mass  -S/p extraction of multiple teeth -Status post mitral valve replacement (bioprosthetic) and closure of the PFO 3/10 Cont vancomycin w/ HD- duration 10/14/20 >11/25/2020 asper ID. Repeat blood cultures from 10/13/20 are negative. -WBCs trending downward from a peak of 21.8 on 3/26 and as of 3/31 14.1 -Had additional fever 4/1- blood cultures negative. No further fevers.  Large complicated abscess with osteomyelitis involving his right tibia- S/P Rt AKA3/25 -Followed by ID, vascular, CT surgery and Dr. Sharol Given  -Patient initially was very reluctant to proceed with AKA but given clinical status eventually agreed -We will discontinue Neurontin in favor of Lyrica 75 mg at at bedtime -Continue OxyContin 15 mg every 12 hours  -4/4 discontinued IV Dilaudid  -Continue p.o. Dilaudid for breakthrough pain -Continue Robaxin 500 3 times daily    Status post right AKAcontinue pain management and wound care as above, wound VAC in place-with bloody drainage likely contributing to anemia.      Incision (Closed) 10/14/20 Groin Right (Active)  Date First Assessed/Time First Assessed: 10/14/20 1132   Location: Groin  Location Orientation: Right    Assessments 10/16/2020  8:00 PM 11/08/2020  8:08 PM  Dressing Type None None  Site / Wound Assessment Clean;Dry Clean;Dry  Margins Attached edges (approximated) Attached edges (approximated)  Closure -- Skin glue;Approximated  Drainage Amount None None  Treatment Other (Comment) --     No Linked orders to display     Incision (Closed) 10/14/20 Chest Right (Active)  Date First Assessed/Time First  Assessed: 10/14/20 1134   Location: Chest  Location Orientation: Right    Assessments 10/14/2020  2:15 PM 11/08/2020  8:08 PM  Dressing Type Silver hydrofiber Liquid skin adhesive  Dressing Clean;Dry;Intact --  Dressing Change Frequency Other (Comment) PRN  Site / Wound Assessment Dressing in place / Unable to assess Clean;Dry  Margins -- Attached edges (approximated)  Closure -- Skin glue;Approximated  Drainage Amount -- None  Treatment Other (Comment) --     No Linked orders to display     Incision (Closed) 10/20/20 Leg Right (Active)  Date First Assessed/Time First Assessed: 10/20/20 1656   Location: Leg  Location Orientation: Right    Assessments 10/21/2020  8:30 AM 11/08/2020  1:00 PM  Dressing Type Compression wrap Negative pressure wound therapy  Dressing Clean;Dry;Intact Clean;Dry;Intact  Site / Wound Assessment Dressing in place / Unable to assess Dressing in place / Unable to assess  Drainage Amount -- Minimal  Drainage Description -- Sanguineous     No Linked orders to display     Incision (Closed) 10/29/20 Knee Right (Active)  Date First Assessed/Time First Assessed: 10/29/20 1341   Location: Knee  Location Orientation: Right    Assessments 10/29/2020  3:00 PM 11/08/2020  8:08 PM  Dressing Type Other (Comment) Negative pressure wound therapy  Dressing Clean;Dry;Intact Clean;Dry;Intact  Site / Wound Assessment Dressing in place / Unable to assess Dressing in  place / Unable to assess  Drainage Amount None --     No Linked orders to display     Negative Pressure Wound Therapy Knee Right (Active)  Placement Date/Time: 10/29/20 1439   Wound Type: Surgical (Open wound)  Location: Knee  Location Orientation: Right    Assessments 10/29/2020  3:00 PM 11/08/2020  8:08 PM  Site / Wound Assessment Dressing in place / Unable to assess Dressing in place / Unable to assess  Peri-wound Assessment -- Intact  Cycle -- Continuous  Target Pressure (mmHg) 125 125  Dressing Status --  Intact  Output (mL) 0 mL --     No Linked orders to display    ESRD and HD MWF: -Appreciate nephrology assistance  Acute blood loss anemia in the setting of surgery,andwound VACbloody draiange/Anemia of chronic kidney disease -S/p3 units PRBC -Continue aranesp and dose was increased.  -No iron due to acute infection.  -Hemoglobin 7.1 as of 3/31-during the early portion of hospitalization patient's hemoglobin has ranged between 8.9 and 10.0.  He may benefit from additional transfusion..  Hypertension -Continue metoprolol and clonidine patch as well as regular hemodialysis.  Nutrition Status: Nutrition Problem: Increased nutrient needs Etiology: chronic illness (ESRD on HD) Signs/Symptoms: estimated needs Interventions: Nepro shake,Refer to RD note for recommendations Estimated body mass index is 21.82 kg/m as calculated from the following:   Height as of this encounter: 6' 0.01" (1.829 m).   Weight as of this encounter: 73 kg.   Other problems: Hyperkalemia: -Resolved  Secondary hyperparathyroidism -continue Sensipar binders and Hectorol per nephrology.   History of hepatitis C/liver cirrhosis -outpatient follow-up with ID. -Monitor LFTs intermittently.   Hyponatremia -Not unexpected in dialysis patient and can be managed with dialysate per nephrology   Nasal bleeding -3/20 resolved.   Procedures:  Echocardiogram with bubble study  Continuous EEG  TEE  Intraoperative TEE  Follow-up echocardiogram  Left heart catheterization  Multiple dental extractions with alveoloplasty  Minimally invasive mitral valve replacement using [employer] Mosaic  Irrigation and debridement of leg  Right above-the-knee amputation    Consultations:  Neurology  Infectious disease  Nephrology  Cardiothoracic surgery  Orthopedic surgery  CIR physician  Palliative medicine   Discharge Exam: Vitals:   11/10/20 1000 11/10/20 1030  BP:  121/68 128/74  Pulse: 85 85  Resp:    Temp:    SpO2:     Constitutional: Alert, calm, no acute distress Respiratory: Anterior lung sounds clear to auscultation, room air Cardiovascular: Heart sounds are S1-S2, pulse regular, skin warm and dry.  Hemodynamically stable during dialysis session Abdomen:  LBM 4/4, normoactive bowel sounds, nontender.  Eating well Musculoskeletal: s/p R AKA -dressing clean dry and intact.  Patient states right still has staples in place. Neurologic: CN 2-12 grossly intact. Sensation intact, DTR normal. Strength 5/5 x all 4 extremities.  Psychiatric: Alert and oriented x3, pleasant affect.    Discharge Instructions   Discharge Instructions    Apply dressing   Complete by: As directed    cleanse stump daily with antibacterial soap such as dial and water. Apply clean dry dressing daily   Diet general   Complete by: As directed    Renal diet   Discharge wound care:   Complete by: As directed    Cleanse AKA wound with antibacterial soap daily and apply dry dressing   Increase activity slowly   Complete by: As directed    Continue aggressive PT and OT with focus on improving mobility with wheelchair and  other assistive devices to allow patient to return to home environment independently after AKA.     Allergies as of 11/10/2020   No Known Allergies     Medication List    STOP taking these medications   amLODipine 10 MG tablet Commonly known as: NORVASC   gabapentin 100 MG capsule Commonly known as: NEURONTIN   heparin 1000 unit/mL Soln injection   hydrALAZINE 100 MG tablet Commonly known as: APRESOLINE   Viagra 50 MG tablet Generic drug: sildenafil     TAKE these medications   (feeding supplement) PROSource Plus liquid Take 30 mLs by mouth 2 (two) times daily between meals.   feeding supplement (NEPRO CARB STEADY) Liqd Take 237 mLs by mouth 3 (three) times daily between meals.   acetaminophen 325 MG tablet Commonly known as:  TYLENOL Take 1-2 tablets (325-650 mg total) by mouth every 6 (six) hours as needed for mild pain (pain score 1-3 or temp > 100.5).   ARANESP (ALBUMIN FREE) IJ Darbepoetin Alfa (Aranesp)   aspirin 325 MG EC tablet Take 1 tablet (325 mg total) by mouth daily.   Auryxia 1 GM 210 MG(Fe) tablet Generic drug: ferric citrate Take 420 mg by mouth 3 (three) times daily.   bisacodyl 10 MG suppository Commonly known as: DULCOLAX Place 1 suppository (10 mg total) rectally daily as needed for moderate constipation.   calcium acetate 667 MG capsule Commonly known as: PHOSLO Take 1 capsule (667 mg total) by mouth 3 (three) times daily with meals. What changed: how much to take   cinacalcet 30 MG tablet Commonly known as: Sensipar Take 2 tablets (60 mg total) by mouth daily with breakfast. What changed:   how much to take  when to take this   cloNIDine 0.3 MG tablet Commonly known as: CATAPRES Take 0.3 mg by mouth in the morning, at noon, and at bedtime.   docusate sodium 100 MG capsule Commonly known as: COLACE Take 1 capsule (100 mg total) by mouth 2 (two) times daily.   HYDROmorphone 2 MG tablet Commonly known as: DILAUDID Take 1 tablet (2 mg total) by mouth every 4 (four) hours as needed for severe pain (Not controlled by oxycontin).   methocarbamol 500 MG tablet Commonly known as: ROBAXIN Take 1 tablet (500 mg total) by mouth 3 (three) times daily.   metoprolol tartrate 50 MG tablet Commonly known as: LOPRESSOR Take 1 tablet (50 mg total) by mouth 2 (two) times daily. What changed:   medication strength  how much to take   multivitamin Tabs tablet Take 1 tablet by mouth at bedtime.   nitroGLYCERIN 0.4 MG SL tablet Commonly known as: NITROSTAT Place 0.4 mg under the tongue every 5 (five) minutes x 3 doses as needed for chest pain.   oxyCODONE 15 mg 12 hr tablet Commonly known as: OXYCONTIN Take 1 tablet (15 mg total) by mouth every 12 (twelve) hours.    pantoprazole 40 MG tablet Commonly known as: PROTONIX Take 1 tablet (40 mg total) by mouth daily.   polyethylene glycol 17 g packet Commonly known as: MIRALAX / GLYCOLAX Take 17 g by mouth daily as needed for mild constipation.   pregabalin 75 MG capsule Commonly known as: LYRICA Take 1 capsule (75 mg total) by mouth at bedtime.   senna-docusate 8.6-50 MG tablet Commonly known as: Senokot-S Take 1 tablet by mouth 2 (two) times daily.   Vancomycin 750-5 MG/150ML-% Soln Commonly known as: VANCOCIN Inject 150 mLs (750 mg total) into the vein every  Monday, Wednesday, and Friday with hemodialysis.     ASK your doctor about these medications   oxyCODONE-acetaminophen 7.5-325 MG tablet Commonly known as: Percocet Take 1 tablet by mouth every 4 (four) hours as needed for severe pain. Ask about: Should I take this medication?            Durable Medical Equipment  (From admission, onward)         Start     Ordered   10/26/20 0830  For home use only DME 3 n 1  Once        10/26/20 0829   10/26/20 0830  For home use only DME 4 wheeled rolling walker with seat  Once       Question Answer Comment  Patient needs a walker to treat with the following condition Debility   Patient needs a walker to treat with the following condition Osteomyelitis of right leg ([place])      10/26/20 0829           Discharge Care Instructions  (From admission, onward)         Start     Ordered   11/10/20 0000  Discharge wound care:       Comments: Cleanse AKA wound with antibacterial soap daily and apply dry dressing   11/10/20 1051         No Known Allergies  Contact information for follow-up providers    [name], MD In 1 week.   Specialty: Orthopedic Surgery Contact information: [place] [address] [phone]        Rexene Alberts, MD. Schedule an appointment as soon as possible for a visit in 1 week(s).   Specialty: Cardiothoracic Surgery Why:  Needs to follow-up with cardiothoracic surgeon within 1 week after discharge to skilled nursing facility.  Please call and arrange follow-up appointment after discharge. Contact information: [address]  [place] [address] [phone]        [facility] Palliative Follow up.   Specialty: PALLIATIVE CARE Why: [facility] will follow up with you regarding outpatient palliative care services. Contact information: [place] [phone]           Contact information for after-discharge care    Destination    [facility] .   Service: Skilled Nursing Contact information: [address] [place] [phone]                   The results of significant diagnostics from this hospitalization (including imaging, microbiology, ancillary and laboratory) are listed below for reference.    Significant Diagnostic Studies: DG Chest 2 View  Result Date: 10/18/2020 CLINICAL DATA:  Status post mitral valve replacement EXAM: CHEST - 2 VIEW COMPARISON:  October 17, 2020 FINDINGS: Right chest tube and Cordis have been removed. No pneumothorax. Temporary pacemaker wires are attached to the right heart. There is stable cardiomegaly with pulmonary vascularity within normal limits. There is now ill-defined airspace opacity in the right upper lobe. There is atelectatic change in the bases. There is a prosthetic mitral valve. There is a stent in the left subclavian region. Tumoral calcinosis right shoulder again noted. IMPRESSION: No pneumothorax. New infiltrate right upper lobe concerning for developing pneumonia. Bibasilar atelectasis. Stable cardiomegaly. Tumoral calcinosis right shoulder region again noted. Electronically Signed   By: Lowella Grip III M.D.   On: 10/18/2020 08:01   DG Chest 2 View  Result Date: 10/13/2020 CLINICAL DATA:  Congestive heart failure, malaise EXAM: CHEST  - 2 VIEW COMPARISON:  10/06/2020 FINDINGS: Pulmonary insufflation is normal and symmetric. Left upper lobe peripheral cavitary nodule is grossly unchanged from prior CT examination,. No pneumothorax or pleural effusion. Cardiac size is mildly enlarged. Pulmonary vascularity is normal. Vascular stent seen within the expected left subclavian vein. 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 IMPRESSION: No radiographic evidence of acute cardiopulmonary disease. Stable cavitary nodule within the left upper lobe, indeterminate. Tumoral calcinosis surrounding the right shoulder. Electronically Signed   By: Fidela Salisbury MD   On: 10/13/2020 06:52   DG Tibia/Fibula Right  Result Date: 10/19/2020 CLINICAL DATA:  Right anterior lower leg swelling 1.5 weeks. No history of injury. EXAM: RIGHT TIBIA AND FIBULA - 2 VIEW COMPARISON:  05/27/2019 FINDINGS: Negative for fracture. Extensive ossification in the soft tissues between the tibia and fibula anteriorly. This is unchanged from the prior study and likely is dystrophic calcification from prior injury. Possible muscle ossification. There is nodular soft tissue swelling of the soft tissues anterior to the proximal tibia question injury or infection. This was not present previously Arterial calcification IMPRESSION: Chronic soft tissue ossification medial to the tibia anteriorly unchanged from the prior study. Likely muscle ossification. New areas of nodular soft tissue swelling anterior to the proximal tibia. Question injury or infection. No acute fracture. Electronically Signed   By: Franchot Gallo M.D.   On: 10/19/2020 15:52   CT TIBIA FIBULA RIGHT W CONTRAST  Addendum Date: 10/20/2020   ADDENDUM REPORT: 10/20/2020 10:32 ADDENDUM: These results were discussed by telephone on 10/20/2020 at 10:32 am to provider MICHAEL JEFFERY , who verbally acknowledged these results. Electronically Signed   By: Davina Poke D.O.    On: 10/20/2020 10:32   Result Date: 10/20/2020 CLINICAL DATA:  Lower leg soft tissue infection. Focal swelling. Concern for abscess. EXAM: CT OF THE LOWER RIGHT EXTREMITY WITH CONTRAST TECHNIQUE: Multidetector CT imaging of the lower right extremity was performed according to the standard protocol following intravenous contrast administration. CONTRAST:  167m OMNIPAQUE IOHEXOL 300 MG/ML  SOLN COMPARISON:  X-ray 10/19/2020, 05/27/2019 FINDINGS: Bones/Joint/Cartilage No acute fracture. No dislocation. No bony erosion or periosteal elevation. No knee or ankle joint effusion. No significant arthropathy of the knee or ankle joints. Ligaments Suboptimally assessed by CT. Muscles and Tendons Extensive intramuscular calcification throughout the tibialis anterior and extensor digitorum longus muscles with calcification extending approximately 25 cm in length. No intramuscular fluid collection. Tendinous structures appear intact. Soft tissues Solid appearing circumscribed mass anterior to the midportion of the tibialis anterior muscle measures 3.5 x 1.7 x 3.3 cm (series 4, image 86; series 10, image 13). Hyperdensity within the central and lateral aspects of this mass may reflect enhancement. There is calcification along the posterior and lateral margins. Peripherally enhancing collection the anterior aspect of the lower leg which is draped over the anterior aspect of the proximal tibial diaphysis measuring 4.3 x 1.6 x 3.2 cm (series 4, image 69) series 10, image 33). There also appears to be some enhancement or internal mineralization within the lateral aspect of this collection or mass. Mild subcutaneous edema anteriorly. No soft tissue gas. No deep fascial fluid collections. Extensive arterial atherosclerosis. IMPRESSION: 1. Solid-appearing circumscribed mass anterior to the midportion of the tibialis anterior muscle measures up to 3.5 cm. There is calcification along the posterior and lateral margins. There also  appears to be some enhancement or internal mineralization within the  lateral aspect of this mass or collection. Findings raise the possibility for a soft tissue neoplasm such as sarcoma. Other entities include but are not limited to amyloid deposition or atypical infection. Further evaluation with ultrasound or contrast-enhanced MRI would be helpful to distinguish cystic versus solid process. 2. Peripherally enhancing collection or mass overlying anterior aspect of the proximal tibial diaphysis measuring up to 4.3 cm. There also appears to be some enhancement or internal mineralization within the lateral aspect of this collection or mass. Abscess is not excluded, although the internal mineralization would be atypical for soft tissue infection. 3. Extensive intramuscular calcification throughout the tibialis anterior and extensor digitorum longus muscles with calcification extending approximately 25 cm in length. Findings are suggestive of sequela of prior myositis or myonecrosis. 4. No acute osseous abnormality.  No evidence of osteomyelitis. Ordering provider has been paged. Documentation of the communication of the above findings will be added to the report in the form of an addendum. Electronically Signed: By: Davina Poke D.O. On: 10/20/2020 10:24   DG CHEST PORT 1 VIEW  Result Date: 11/05/2020 CLINICAL DATA:  Shortness of breath, chest pain. EXAM: PORTABLE CHEST 1 VIEW COMPARISON:  October 18, 2020. FINDINGS: Stable cardiomegaly. No pneumothorax is noted. Small bilateral pleural effusions are noted. Mild bibasilar subsegmental atelectasis is noted. Right upper lobe opacity is noted concerning for atelectasis or possibly scarring. 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 IMPRESSION: Mild bibasilar subsegmental atelectasis is noted with small bilateral pleural effusions. Right upper lobe opacity is again noted concerning for atelectasis or possibly scarring. Electronically Signed   By: Marijo Conception M.D.   On: 11/05/2020 15:50   DG Chest Port 1 View  Result Date: 10/17/2020 CLINICAL DATA:  Status post mitral valve replacement. EXAM: PORTABLE CHEST 1 VIEW COMPARISON:  October 16, 2020 FINDINGS: A left IJ sheath and chest tubes remain in place. There is a right-sided pleural effusion with underlying atelectasis. Probable atelectasis in the left base. The cardiomediastinal silhouette is stable. No pneumothorax. Tumoral calcinosis identified in the right shoulder. IMPRESSION: No interval change in support apparatus. No pneumothorax. Continued right effusion and bibasilar atelectasis. Electronically Signed   By: Dorise Bullion III M.D   On: 10/17/2020 08:08   DG Chest Port 1 View  Result Date: 10/16/2020 CLINICAL DATA:  Mitral valve replacement EXAM: PORTABLE CHEST 1 VIEW COMPARISON:  October 15, 2020 FINDINGS: Swan-Ganz catheter has been removed. Cordis tip is in the left innominate vein. There is a chest tube on the right. Temporary pacemaker wires are attached to the right heart. There is an apparent pericardial drain. No pneumothorax. Small pleural effusions remain with questionable loculation laterally on the right. There is bibasilar atelectasis. No new opacity. There is cardiomegaly. Pulmonary vascularity is within normal limits. No adenopathy appreciable. Status post mitral valve replacement. Stent noted in the left subclavian region. 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 IMPRESSION: Tube and catheter positions as described without evident pneumothorax. Small pleural effusions bilaterally with bibasilar atelectasis. Stable cardiomegaly. 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 Electronically Signed   By: Lowella Grip III M.D.   On: 10/16/2020 08:14   DG Chest Port 1 View  Result Date: 10/15/2020 CLINICAL DATA:  Status post extubation. EXAM: PORTABLE CHEST 1 VIEW COMPARISON:  October 14, 2020 FINDINGS: Endotracheal tube and nasogastric tube have been  removed. Swan-Ganz catheter tip is in the main pulmonary outflow tract. There is a right chest tube. Temporary pacemaker  wires are attached to the right heart. There is no appreciable pneumothorax. There is a small pleural effusion on each side with bibasilar atelectasis. There is also atelectasis in the right upper lobe. There is cardiomegaly with pulmonary vascularity normal. No adenopathy. Patient is status post mitral valve replacement. Stent noted in left subclavian region. No bone lesions. IMPRESSION: Tube and catheter positions as described without pneumothorax. There are new small pleural effusions bilaterally with atelectatic change in each lung base and right upper lobe. Stable cardiomegaly. Electronically Signed   By: Lowella Grip III M.D.   On: 10/15/2020 08:16   DG Chest Port 1 View  Result Date: 10/14/2020 CLINICAL DATA:  Hypoxia.  Status post mitral valve replacement EXAM: PORTABLE CHEST 1 VIEW COMPARISON:  October 13, 2020 FINDINGS: Endotracheal tube tip is 4.0 cm above the carina. Swan-Ganz catheter tip is in the proximal right main pulmonary artery. Nasogastric tube tip and side port are below the diaphragm. There is a right chest tube. Temporary pacemaker wires are attached to the right heart. No pneumothorax. There is mild atelectasis in the lung bases. Lungs elsewhere are clear. There is cardiomegaly. Status post mitral valve replacement. Stent noted in the left subclavian region. 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 IMPRESSION: Tube and catheter positions as described without evident pneumothorax. Bibasilar atelectasis. Cardiomegaly. Status post mitral valve replacement. 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 Electronically Signed   By: Lowella Grip III M.D.   On: 10/14/2020 15:25   ECHO INTRAOPERATIVE TEE  Result Date: 10/14/2020  *INTRAOPERATIVE TRANSESOPHAGEAL REPORT *  Patient Name:   LADARRIAN GIFFEN Date of Exam: 10/14/2020  Medical Rec #:  [id number]      Height:       72.0 in Accession #:    [id number]     Weight:       162.4 lb Date of Birth:  05/04/1971      BSA:          1.95 m Patient Age:    29 years       BP:           126/89 mmHg Patient Gender: M              HR:           94 bpm. Exam Location:  Inpatient Transesophogeal exam was perform intraoperatively during surgical procedure. Patient was closely monitored under general anesthesia during the entirety of examination. Indications:     Mitral Valve Disease; Endocarditis Sonographer:     Mikki Santee RDCS (AE) Performing Phys: [place] Diagnosing Phys: Roderic Palau MD Complications: No known complications during this procedure. POST-OP IMPRESSIONS - Left Ventricle: The left ventricle is unchanged from pre-bypass. - Right Ventricle: The right ventricle appears unchanged from pre-bypass. - Left Atrial Appendage: The left atrial appendage appears unchanged from pre-bypass. - Aortic Valve: The aortic valve appears unchanged from pre-bypass. - Mitral Valve: A porcine bioprosthetic valve was placed, leaflets are freely mobile and leaflets thin Manufactured by; [employer] Size; 31. No regurgitation post repair. The gradient recorded across the prosthetic valve is within the expected range. - Tricuspid Valve: The tricuspid valve appears unchanged from pre-bypass. - Pulmonic Valve: The pulmonic valve appears unchanged from pre-bypass. PRE-OP FINDINGS  Left Ventricle: The left ventricle has hyperdynamic systolic function, with an ejection fraction of >65%. The cavity size was normal. There is mildly increased left ventricular wall thickness. There is mild concentric left ventricular hypertrophy. Right Ventricle: The right  ventricle has normal systolic function. The cavity was not assessed. There is no increase in right ventricular wall thickness. Left Atrium: Left atrial size was dilated. No left atrial/left atrial appendage thrombus was detected. Right Atrium:  Right atrial size was dilated. Interatrial Septum: No atrial level shunt detected by color flow Doppler. Agitated saline contrast bubble study was negative, with no evidence of any interatrial shunt. There is no evidence of a patent foramen ovale. Pericardium: There is no evidence of pericardial effusion. Mitral Valve: Abnormal. Mitral valve regurgitation is moderate by color flow Doppler. There is moderate thickening present on the mitral valve posterior cusp. There is a large mobile mitral valve vegetation present on the posterior, P1, P2, P3 and A1 cusps. Tricuspid Valve: The tricuspid valve was normal in structure. Tricuspid valve regurgitation is trivial by color flow Doppler. Aortic Valve: The aortic valve is normal in structure. Aortic valve regurgitation was not visualized by color flow Doppler. Pulmonic Valve: The pulmonic valve was normal in structure. Pulmonic valve regurgitation is trivial by color flow Doppler. +--------------+--------++ LEFT VENTRICLE         +--------------+--------++ PLAX 2D                +--------------+--------++ LVIDd:        2.90 cm  +--------------+--------++ LVIDs:        2.70 cm  +--------------+--------++ LVOT diam:    2.00 cm  +--------------+--------++ LV SV:        5 ml     +--------------+--------++ LV SV Index:  2.69     +--------------+--------++ LVOT Area:    3.14 cm +--------------+--------++                        +--------------+--------++ +-------------+---------++ MITRAL VALVE           +--------------+-------+ +-------------+---------++ SHUNTS                MV Peak grad:8.1 mmHg  +--------------+-------+ +-------------+---------++ Systemic Diam:2.00 cm MV Mean grad:2.0 mmHg  +--------------+-------+ +-------------+---------++ MV Vmax:     1.42 m/s  +-------------+---------++ MV Vmean:    71.4 cm/s +-------------+---------++ MV VTI:      0.38 m    +-------------+---------++  Roderic Palau MD  Electronically signed by Roderic Palau MD Signature Date/Time: 10/14/2020/4:15:51 PM    Final     Microbiology: Recent Results (from the past 240 hour(s))  Culture, blood (routine x 2)     Status: None (Preliminary result)   Collection Time: 11/05/20  4:32 PM   Specimen: BLOOD RIGHT HAND  Result Value Ref Range Status   Specimen Description BLOOD RIGHT HAND  Final   Special Requests   Final    BOTTLES DRAWN AEROBIC ONLY Blood Culture results may not be optimal due to an inadequate volume of blood received in culture bottles   Culture   Final    NO GROWTH 4 DAYS Performed at [facility], [place] [address] [place], [place] [address]    Report Status PENDING  Incomplete  Culture, blood (routine x 2)     Status: None (Preliminary result)   Collection Time: 11/05/20  4:45 PM   Specimen: BLOOD RIGHT HAND  Result Value Ref Range Status   Specimen Description BLOOD RIGHT HAND  Final   Special Requests   Final    BOTTLES DRAWN AEROBIC ONLY Blood Culture results may not be optimal due to an inadequate volume of blood received in culture bottles   Culture  Final    NO GROWTH 4 DAYS Performed at [facility], [place] [address], [place], [place] [address]    Report Status PENDING  Incomplete  SARS CORONAVIRUS 2 (TAT 6-24 HRS) Nasopharyngeal Nasopharyngeal Swab     Status: None   Collection Time: 11/09/20  2:47 PM   Specimen: Nasopharyngeal Swab  Result Value Ref Range Status   SARS Coronavirus 2 NEGATIVE NEGATIVE Final    Comment: (NOTE) SARS-CoV-2 target nucleic acids are NOT DETECTED.  The SARS-CoV-2 RNA is generally detectable in upper and lower respiratory specimens during the acute phase of infection. Negative results do not preclude SARS-CoV-2 infection, do not rule out co-infections with other pathogens, and should not be used as the sole basis for treatment or other patient management decisions. Negative results must be combined with clinical observations, patient  history, and epidemiological information. The expected result is Negative.  Fact Sheet for Patients: SugarRoll.be  Fact Sheet for Healthcare Providers: https://www.woods-mathews.com/  This test is not yet approved or cleared by the [place] FDA and  has been authorized for detection and/or diagnosis of SARS-CoV-2 by FDA under an Emergency Use Authorization (EUA). This EUA will remain  in effect (meaning this test can be used) for the duration of the COVID-19 declaration under Se ction 564(b)(1) of the Act, 21 U.S.C. section 360bbb-3(b)(1), unless the authorization is terminated or revoked sooner.  Performed at [facility], [place] [address] [place], [place] [address]      Labs: Basic Metabolic Panel: Recent Labs  Lab 11/06/20 0150 11/07/20 0226 11/08/20 0500 11/09/20 0317 11/10/20 0110  NA 130* 129* 127* 131* 130*  K 4.0 4.5 5.9* 3.9 4.3  CL 93* 91* 90* 92* 94*  CO2 '28 26 22 26 24  '$ GLUCOSE 125* 101* 91 110* 97  BUN 21* 34* 50* 29* 37*  CREATININE 4.53* 6.13* 8.15* 5.57* 7.04*  CALCIUM 7.7* 7.8* 7.6* 7.9* 7.9*  PHOS 3.2 4.9* 6.0* 4.1 4.1   Liver Function Tests: Recent Labs  Lab 11/06/20 0150 11/07/20 0226 11/08/20 0500 11/09/20 0317 11/10/20 0110  AST 24  --   --   --   --   ALT 8  --   --   --   --   ALKPHOS 53  --   --   --   --   BILITOT 0.6  --   --   --   --   PROT 7.2  --   --   --   --   ALBUMIN 1.9* 2.0* 2.1* 2.1* 2.1*   No results for input(s): LIPASE, AMYLASE in the last 168 hours. No results for input(s): AMMONIA in the last 168 hours. CBC: Recent Labs  Lab 11/06/20 0150 11/07/20 0226 11/07/20 1516 11/08/20 0500 11/09/20 0317 11/10/20 0110  WBC 15.0* 15.1*  --  16.6* 15.8* 18.4*  NEUTROABS 10.0* 10.0*  --  11.2* 10.2* 12.3*  HGB 7.2* 7.0* 8.2* 7.9* 9.0* 8.4*  HCT 23.7* 23.0* 26.6* 25.3* 29.4* 27.8*  MCV 90.1 87.8  --  87.2 88.0 89.1  PLT 462* 478*  --  547* 590* 540*   Cardiac  Enzymes: No results for input(s): CKTOTAL, CKMB, CKMBINDEX, TROPONINI in the last 168 hours. BNP: BNP (last 3 results) No results for input(s): BNP in the last 8760 hours.  ProBNP (last 3 results) No results for input(s): PROBNP in the last 8760 hours.  CBG: Recent Labs  Lab 11/03/20 1206  GLUCAP 83  Signed:  Erin Hearing ANP [facility] 11/10/2020, 10:51 AM

## 2020-11-10 NOTE — Progress Notes (Signed)
This chaplain responded to PMT consult for notarizing the Pt. Advance Directive.  The Pt. shares he is tired from morning dialysis. The Pt. AD document is complete and laying on the Pt. bedside table.    The chaplain understands witnesses are not available at the time of the chaplain visit.  The Pt. has a scheduled d/c around 2pm today.  The chaplain informed the Pt. of how to notarize the AD outside the hospital.    The chaplain will F/U with the notary and witnesses as time permits.

## 2020-11-10 NOTE — Progress Notes (Signed)
This chaplain is present with Pt., notary, and witnesses for notarizing of the Pt. Advance Directive:  HCPOA and Living Will.  The Pt. named Patrick Anthony as his healthcare agent.  If the healthcare agent is unwilling or unable to serve the Pt. names Dony Cipolla as his next choice.  The chaplain gave the Pt. the original Advance Directive and two copies.  A copy of the Pt. AD was scanned to EMR.  The chaplain updated the Pt. RN-Michelle.

## 2020-11-10 NOTE — Progress Notes (Signed)
[facility] at [phone]- Attempted report
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
## 2020-11-10 NOTE — Progress Notes (Signed)
[facility])  Referral received for outpatient palliative care services once Patrick Anthony d/c's to [facility] later today.  Thank you, Venia Carbon RN, BSN, [facility] Liaison
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
## 2020-11-11 ENCOUNTER — Ambulatory Visit: Payer: Medicaid Other | Admitting: Infectious Disease

## 2020-11-11 ENCOUNTER — Telehealth: Payer: Self-pay

## 2020-11-11 DIAGNOSIS — M86161 Other acute osteomyelitis, right tibia and fibula: Secondary | ICD-10-CM | POA: Insufficient documentation

## 2020-11-11 DIAGNOSIS — M869 Osteomyelitis, unspecified: Secondary | ICD-10-CM | POA: Diagnosis not present

## 2020-11-11 DIAGNOSIS — E440 Moderate protein-calorie malnutrition: Secondary | ICD-10-CM | POA: Insufficient documentation

## 2020-11-11 DIAGNOSIS — R7881 Bacteremia: Secondary | ICD-10-CM | POA: Diagnosis not present

## 2020-11-11 DIAGNOSIS — I330 Acute and subacute infective endocarditis: Secondary | ICD-10-CM | POA: Diagnosis not present

## 2020-11-11 DIAGNOSIS — B9562 Methicillin resistant Staphylococcus aureus infection as the cause of diseases classified elsewhere: Secondary | ICD-10-CM | POA: Diagnosis not present

## 2020-11-11 NOTE — Telephone Encounter (Signed)
Transition Care Management Unsuccessful Follow-up Telephone Call  Date of discharge and from where:  11/10/2020 from [facility]  Attempts:  1st Attempt  Reason for unsuccessful TCM follow-up call:  Left voice message

## 2020-11-12 DIAGNOSIS — R781 Finding of opiate drug in blood: Secondary | ICD-10-CM | POA: Diagnosis not present

## 2020-11-12 DIAGNOSIS — N2581 Secondary hyperparathyroidism of renal origin: Secondary | ICD-10-CM | POA: Diagnosis not present

## 2020-11-12 DIAGNOSIS — M869 Osteomyelitis, unspecified: Secondary | ICD-10-CM | POA: Diagnosis not present

## 2020-11-12 DIAGNOSIS — B9562 Methicillin resistant Staphylococcus aureus infection as the cause of diseases classified elsewhere: Secondary | ICD-10-CM | POA: Diagnosis not present

## 2020-11-12 DIAGNOSIS — Z992 Dependence on renal dialysis: Secondary | ICD-10-CM | POA: Diagnosis not present

## 2020-11-12 DIAGNOSIS — D689 Coagulation defect, unspecified: Secondary | ICD-10-CM | POA: Diagnosis not present

## 2020-11-12 DIAGNOSIS — A4102 Sepsis due to Methicillin resistant Staphylococcus aureus: Secondary | ICD-10-CM | POA: Diagnosis not present

## 2020-11-12 DIAGNOSIS — N186 End stage renal disease: Secondary | ICD-10-CM | POA: Diagnosis not present

## 2020-11-12 NOTE — Telephone Encounter (Signed)
Transition Care Management Unsuccessful Follow-up Telephone Call  Date of discharge and from where:  11/10/2020 from [facility]  Attempts:  2nd Attempt  Reason for unsuccessful TCM follow-up call:  Left voice message

## 2020-11-15 DIAGNOSIS — Z5181 Encounter for therapeutic drug level monitoring: Secondary | ICD-10-CM | POA: Diagnosis not present

## 2020-11-15 DIAGNOSIS — N2581 Secondary hyperparathyroidism of renal origin: Secondary | ICD-10-CM | POA: Diagnosis not present

## 2020-11-15 DIAGNOSIS — Z992 Dependence on renal dialysis: Secondary | ICD-10-CM | POA: Diagnosis not present

## 2020-11-15 DIAGNOSIS — D631 Anemia in chronic kidney disease: Secondary | ICD-10-CM | POA: Diagnosis not present

## 2020-11-15 DIAGNOSIS — N186 End stage renal disease: Secondary | ICD-10-CM | POA: Diagnosis not present

## 2020-11-15 DIAGNOSIS — A4102 Sepsis due to Methicillin resistant Staphylococcus aureus: Secondary | ICD-10-CM | POA: Diagnosis not present

## 2020-11-15 DIAGNOSIS — E875 Hyperkalemia: Secondary | ICD-10-CM | POA: Diagnosis not present

## 2020-11-15 DIAGNOSIS — D689 Coagulation defect, unspecified: Secondary | ICD-10-CM | POA: Diagnosis not present

## 2020-11-15 NOTE — Telephone Encounter (Signed)
Transition Care Management Unsuccessful Follow-up Telephone Call  Date of discharge and from where:  11/10/2020 from [facility]  Attempts:  3rd Attempt  Reason for unsuccessful TCM follow-up call:  Unable to reach patient   Pt was discharged to [facility].

## 2020-11-17 ENCOUNTER — Encounter: Payer: Self-pay | Admitting: Internal Medicine

## 2020-11-17 ENCOUNTER — Other Ambulatory Visit: Payer: Self-pay

## 2020-11-17 ENCOUNTER — Ambulatory Visit (INDEPENDENT_AMBULATORY_CARE_PROVIDER_SITE_OTHER): Payer: Medicaid Other | Admitting: Internal Medicine

## 2020-11-17 VITALS — BP 84/53 | HR 76 | Temp 98.1°F | Resp 16 | Ht 72.0 in | Wt 163.0 lb

## 2020-11-17 DIAGNOSIS — Z992 Dependence on renal dialysis: Secondary | ICD-10-CM | POA: Diagnosis not present

## 2020-11-17 DIAGNOSIS — M86461 Chronic osteomyelitis with draining sinus, right tibia and fibula: Secondary | ICD-10-CM

## 2020-11-17 DIAGNOSIS — N186 End stage renal disease: Secondary | ICD-10-CM | POA: Diagnosis not present

## 2020-11-17 DIAGNOSIS — D631 Anemia in chronic kidney disease: Secondary | ICD-10-CM | POA: Diagnosis not present

## 2020-11-17 DIAGNOSIS — E875 Hyperkalemia: Secondary | ICD-10-CM | POA: Diagnosis not present

## 2020-11-17 DIAGNOSIS — N2581 Secondary hyperparathyroidism of renal origin: Secondary | ICD-10-CM | POA: Diagnosis not present

## 2020-11-17 DIAGNOSIS — Z5181 Encounter for therapeutic drug level monitoring: Secondary | ICD-10-CM | POA: Diagnosis not present

## 2020-11-17 DIAGNOSIS — D689 Coagulation defect, unspecified: Secondary | ICD-10-CM | POA: Diagnosis not present

## 2020-11-17 DIAGNOSIS — I330 Acute and subacute infective endocarditis: Secondary | ICD-10-CM | POA: Diagnosis not present

## 2020-11-17 DIAGNOSIS — B182 Chronic viral hepatitis C: Secondary | ICD-10-CM

## 2020-11-17 DIAGNOSIS — L02415 Cutaneous abscess of right lower limb: Secondary | ICD-10-CM | POA: Diagnosis not present

## 2020-11-17 DIAGNOSIS — A4102 Sepsis due to Methicillin resistant Staphylococcus aureus: Secondary | ICD-10-CM | POA: Diagnosis not present
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
## 2020-11-17 NOTE — Assessment & Plan Note (Signed)
Now s/p amputation with adequate source control.

## 2020-11-17 NOTE — Assessment & Plan Note (Signed)
He has done well with treatment and no new issues so will plan to stop on 11/24/20 as previously planned.  He will have surveillance blood cultures done about 5-7 days later at his rehab facility to assure clearance of the MRSA.

## 2020-11-17 NOTE — Assessment & Plan Note (Signed)
He has positive hepatitis C RNA and will need treatment.  He will return after treatment completion for his endocarditis and will check his genotype and consider treatment options.

## 2020-11-18 ENCOUNTER — Ambulatory Visit (INDEPENDENT_AMBULATORY_CARE_PROVIDER_SITE_OTHER): Payer: Medicaid Other | Admitting: Physician Assistant

## 2020-11-18 ENCOUNTER — Telehealth: Payer: Self-pay

## 2020-11-18 ENCOUNTER — Encounter: Payer: Self-pay | Admitting: Orthopedic Surgery

## 2020-11-18 DIAGNOSIS — M86462 Chronic osteomyelitis with draining sinus, left tibia and fibula: Secondary | ICD-10-CM

## 2020-11-18 NOTE — Progress Notes (Signed)
Office Visit Note   Patient: Patrick Anthony           Date of Birth: Feb 19, 1971           MRN: [id number] Visit Date: 11/18/2020              Requested by: [name], MD [address] [place],  [place] [address] PCP: [name], MD  Chief Complaint  Patient presents with  . 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  He is doing quite well.  He is currently at [facility]  Assessment & Plan: Visit Diagnoses: No diagnosis found.  Plan: Patient was provided a prescription for a prosthetic as well as prescription for above-the-knee shrinkers.  He will follow-up in 2 weeks  Follow-Up Instructions: No follow-ups on file.   Ortho Exam  Patient is alert, oriented, no adenopathy, well-dressed, normal affect, normal respiratory effort. Exam demonstrates well apposed wound edges swelling is overall well controlled he does not have a shrinker.  Sutures and staples are intact.  No ascending cellulitis no drainage from the wound  Imaging: No results found. No images are attached to the encounter.  Labs: Lab Results  Component Value Date   HGBA1C 5.5 09/29/2020   HGBA1C 5.4 09/28/2020   HGBA1C 4.6 08/28/2013   ESRSEDRATE 102 (H) 11/05/2020   CRP 28.8 (H) 11/05/2020   REPTSTATUS 11/10/2020 FINAL 11/05/2020   GRAMSTAIN  10/20/2020    RARE WBC PRESENT,BOTH PMN AND MONONUCLEAR NO ORGANISMS SEEN    CULT  11/05/2020    NO GROWTH 5 DAYS Performed at [facility], [place] [address], [place],  [address]    LABORGA METHICILLIN RESISTANT STAPHYLOCOCCUS AUREUS 10/20/2020     Lab Results  Component Value Date   ALBUMIN 2.1 (L) 11/10/2020   ALBUMIN 2.1 (L) 11/09/2020   ALBUMIN 2.1 (L) 11/08/2020   PREALBUMIN 10.8 (L) 10/13/2020   PREALBUMIN 12.9 (L) 10/07/2020    Lab Results  Component Value Date   MG 2.0 10/15/2020   MG 2.6 (H) 10/15/2020   MG 2.6 (H) 10/14/2020   No  results found for: VD25OH  Lab Results  Component Value Date   PREALBUMIN 10.8 (L) 10/13/2020   PREALBUMIN 12.9 (L) 10/07/2020   CBC EXTENDED Latest Ref Rng & Units 11/10/2020 11/09/2020 11/08/2020  WBC 4.0 - 10.5 K/uL 18.4(H) 15.8(H) 16.6(H)  RBC 4.22 - 5.81 MIL/uL 3.12(L) 3.34(L) 2.90(L)  HGB 13.0 - 17.0 g/dL 8.4(L) 9.0(L) 7.9(L)  HCT 39.0 - 52.0 % 27.8(L) 29.4(L) 25.3(L)  PLT 150 - 400 K/uL 540(H) 590(H) 547(H)  NEUTROABS 1.7 - 7.7 K/uL 12.3(H) 10.2(H) 11.2(H)  LYMPHSABS 0.7 - 4.0 K/uL 1.5 1.8 1.8     There is no height or weight on file to calculate BMI.  Orders:  No orders of the defined types were placed in this encounter.  No orders of the defined types were placed in this encounter.    Procedures: No procedures performed  Clinical Data: No additional findings.  ROS:  All other systems negative, except as noted in the HPI. Review of Systems  Objective: Vital Signs: There were no vitals taken for this visit.  Specialty Comments:  No specialty comments available.  PMFS History: Patient Active Problem List   Diagnosis Date Noted  . Moderate protein-calorie malnutrition ([place]) 11/11/2020  . Other acute osteomyelitis, right tibia and fibula ([place])  11/11/2020  . S/P AKA (above knee amputation), right ([place])   . 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 Chronic osteomyelitis of right tibia with draining sinus ([place])   . Abscess of right leg   . S/P mitral valve replacement   . S/P mitral valve repair   . S/P minimally-invasive mitral valve replacement with bioprosthetic valve 10/14/2020  . Dental caries   . Chronic apical periodontitis   . Chronic periodontitis   . Accretions on teeth   . Mitral regurgitation   . Uncontrolled hypertension   . Patent foramen ovale   . Cerebral embolism with cerebral infarction 09/29/2020  . 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  Bacteremia due to Staphylococcus aureus   . 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 Lactic acidosis 09/27/2020  . Bacterial endocarditis 09/27/2020  . Allergy, unspecified, subsequent encounter 04/15/2020  . Anaphylactic shock, unspecified, subsequent encounter 04/15/2020  . Complication of vascular dialysis catheter 10/31/2019  . Kidney disease 02/04/2019  . Personal history of Methicillin resistant Staphylococcus aureus infection 09/11/2018  . Encounter for immunization 09/07/2017  . Dependence on renal dialysis ([place]) 03/07/2017  . Iron deficiency anemia, unspecified 11/15/2015  . Coagulation defect, unspecified ([place]) 10/08/2015  . Gastrointestinal hemorrhage, unspecified 10/08/2015  . Pain, unspecified 10/08/2015  . Secondary hyperparathyroidism of renal origin ([place]) 10/08/2015  . End stage renal disease ([place]) 10/17/2013  . Anemia in chronic kidney disease 08/30/2013  . Hepatitis C 08/30/2013  . ESRD on dialysis ([place]) 08/30/2013  . Hypertensive crisis 08/28/2013  . Hypertensive emergency 04/20/2013  . Chronic combined systolic and diastolic congestive heart failure ([place]) 04/19/2013  . HTN (hypertension) 04/19/2013   Past Medical History:  Diagnosis Date  . 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 Chronic kidney disease    patient on dialysis tues-thurs-sat  . Cirrhosis of liver ([place])   . Drug addiction ([place])   . ETOH abuse   . Hepatitis   . Hypertension   . Mitral regurgitation   . Patent foramen ovale 10/23/2019  . Renal disorder renal failure  . S/P minimally-invasive mitral valve replacement with bioprosthetic valve 10/14/2020   31 mm Medtronic Mosaic stented porcine bioprosthetic tissue valve via right mini thoracotomy approach  . Sleep apnea   . Systolic and diastolic CHF, 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[place]) 04/19/2013    Family History  Problem Relation Age  of Onset  . Hypertension Mother   . Cancer Mother        breast    Past Surgical History:  Procedure Laterality Date  . AMPUTATION Right 10/29/2020   Procedure: RIGHT ABOVE KNEE AMPUTATION;  Surgeon: [name], MD;  Location: [place];  Service: Orthopedics;  Laterality: Right;  . AV FISTULA PLACEMENT Right   . AV FISTULA PLACEMENT Left 09/03/2013   Procedure: ARTERIOVENOUS (AV) FISTULA CREATION - LEFT BRACHIOCEPHALIC;  Surgeon: Conrad Hendrum, MD;  Location: [facility];  Service: Vascular;  Laterality: Left;  . AV FISTULA PLACEMENT Left 10/30/2019   Procedure: INSERTION OF ARTERIOVENOUS (AV) GORE-TEX GRAFT ARM;  Surgeon: Rosetta Posner, MD;  Location: [place];  Service: Vascular;  Laterality: Left;  . BUBBLE STUDY  10/05/2020   Procedure: BUBBLE STUDY;  Surgeon: Pixie Casino, MD;  Location: [place];  Service: Cardiovascular;;  .  I & D EXTREMITY Right 10/20/2020   Procedure: IRRIGATION AND DEBRIDEMENT OF LEG;  Surgeon: [name], MD;  Location: [place];  Service: Orthopedics;  Laterality: Right;  . INSERTION OF DIALYSIS CATHETER N/A 08/29/2013   Procedure: INSERTION OF DIALYSIS CATHETER;  Surgeon: Mal Misty, MD;  Location: [place];  Service: Vascular;  Laterality: N/A;  . INSERTION OF DIALYSIS CATHETER Left 10/30/2019   Procedure: INSERTION OF DIALYSIS CATHETER LEFT INTERNAL JUGULAR;  Surgeon: Rosetta Posner, MD;  Location: [place];  Service: Vascular;  Laterality: Left;  . LEFT HEART CATH AND CORONARY ANGIOGRAPHY N/A 10/07/2020   Procedure: LEFT HEART CATH AND CORONARY ANGIOGRAPHY;  Surgeon: Lorretta Harp, MD;  Location: [place] [facility];  Service: Cardiovascular;  Laterality: N/A;  . MITRAL VALVE REPAIR Right 10/14/2020   Procedure: MINIMALLY INVASIVE MITRAL VALVE  REPLACEMENT(MVR) USING MEDTRONIC MOSAIC VALVE SIZE 31MM;  Surgeon: Rexene Alberts, MD;  Location: [place];  Service: Open Heart Surgery;  Laterality: Right;  . MULTIPLE EXTRACTIONS WITH ALVEOLOPLASTY N/A 10/12/2020   Procedure:  MULTIPLE EXTRACTION WITH ALVEOLOPLASTY;  Surgeon: [name], DMD;  Location: [place];  Service: Dentistry;  Laterality: N/A;  . PERIPHERAL VASCULAR CATHETERIZATION N/A 01/06/2015   Procedure: A/V Shuntogram/Fistulagram;  Surgeon: Katha Cabal, MD;  Location: [place] [facility];  Service: Cardiovascular;  Laterality: N/A;  . PERIPHERAL VASCULAR CATHETERIZATION Left 01/06/2015   Procedure: A/V Shunt Intervention;  Surgeon: Katha Cabal, MD;  Location: [place] [facility];  Service: Cardiovascular;  Laterality: Left;  . REPAIR OF PATENT FORAMEN OVALE N/A 10/14/2020   Procedure: CLOSURE OF PATENT FORAMEN OVALE;  Surgeon: Rexene Alberts, MD;  Location: [place];  Service: Open Heart Surgery;  Laterality: N/A;  . TEE WITHOUT CARDIOVERSION N/A 10/05/2020   Procedure: TRANSESOPHAGEAL ECHOCARDIOGRAM (TEE);  Surgeon: Pixie Casino, MD;  Location: [place];  Service: Cardiovascular;  Laterality: N/A;  . TEE WITHOUT CARDIOVERSION N/A 10/14/2020   Procedure: TRANSESOPHAGEAL ECHOCARDIOGRAM (TEE);  Surgeon: Rexene Alberts, MD;  Location: [place];  Service: Open Heart Surgery;  Laterality: N/A;   Social History   Occupational History  . Not on file  Tobacco Use  . Smoking status: Former Smoker    Packs/day: 0.20    Types: Cigarettes    Quit date: 10/05/2017    Years since quitting: 3.1  . Smokeless tobacco: Never Used  . Tobacco comment: Quit 3 years ago  Vaping Use  . Vaping Use: Never used  Substance and Sexual Activity  . Alcohol use: No  . Drug use: No    Comment: Quit  . Sexual activity: Not Currently    Birth control/protection: Condom

## 2020-11-18 NOTE — Telephone Encounter (Signed)
Per Dr. Linus Salmons he would like for patient to have blood cultures x 2 drawn at the facility [facility]) on 11/29/20 and results faxed to our office. I have spoke Sharnell with [facility] and verbal order given to drawn blood cultures x 2. Sharnell verbalized understanding and read orders back to me. Fax number for our office also provided to her.  Selso Mannor T Brooks Sailors

## 2020-11-19 DIAGNOSIS — D631 Anemia in chronic kidney disease: Secondary | ICD-10-CM | POA: Diagnosis not present

## 2020-11-19 DIAGNOSIS — Z992 Dependence on renal dialysis: Secondary | ICD-10-CM | POA: Diagnosis not present

## 2020-11-19 DIAGNOSIS — Z5181 Encounter for therapeutic drug level monitoring: Secondary | ICD-10-CM | POA: Diagnosis not present

## 2020-11-19 DIAGNOSIS — E875 Hyperkalemia: Secondary | ICD-10-CM | POA: Diagnosis not present

## 2020-11-19 DIAGNOSIS — D689 Coagulation defect, unspecified: Secondary | ICD-10-CM | POA: Diagnosis not present

## 2020-11-19 DIAGNOSIS — N2581 Secondary hyperparathyroidism of renal origin: Secondary | ICD-10-CM | POA: Diagnosis not present

## 2020-11-19 DIAGNOSIS — N186 End stage renal disease: Secondary | ICD-10-CM | POA: Diagnosis not present

## 2020-11-19 DIAGNOSIS — A4102 Sepsis due to Methicillin resistant Staphylococcus aureus: Secondary | ICD-10-CM | POA: Diagnosis not present

## 2020-11-20 DIAGNOSIS — K5900 Constipation, unspecified: Secondary | ICD-10-CM | POA: Diagnosis not present

## 2020-11-22 DIAGNOSIS — Z992 Dependence on renal dialysis: Secondary | ICD-10-CM | POA: Diagnosis not present

## 2020-11-22 DIAGNOSIS — D689 Coagulation defect, unspecified: Secondary | ICD-10-CM | POA: Diagnosis not present

## 2020-11-22 DIAGNOSIS — A4102 Sepsis due to Methicillin resistant Staphylococcus aureus: Secondary | ICD-10-CM | POA: Diagnosis not present

## 2020-11-22 DIAGNOSIS — N186 End stage renal disease: Secondary | ICD-10-CM | POA: Diagnosis not present

## 2020-11-22 DIAGNOSIS — N2581 Secondary hyperparathyroidism of renal origin: Secondary | ICD-10-CM | POA: Diagnosis not present

## 2020-11-22 DIAGNOSIS — Z5181 Encounter for therapeutic drug level monitoring: Secondary | ICD-10-CM | POA: Diagnosis not present

## 2020-11-22 DIAGNOSIS — E875 Hyperkalemia: Secondary | ICD-10-CM | POA: Diagnosis not present

## 2020-11-23 ENCOUNTER — Other Ambulatory Visit: Payer: Self-pay

## 2020-11-23 ENCOUNTER — Non-Acute Institutional Stay: Payer: Medicaid Other | Admitting: Student

## 2020-11-23 DIAGNOSIS — I330 Acute and subacute infective endocarditis: Secondary | ICD-10-CM | POA: Diagnosis not present

## 2020-11-23 DIAGNOSIS — Z515 Encounter for palliative care: Secondary | ICD-10-CM | POA: Diagnosis not present

## 2020-11-23 DIAGNOSIS — Z89611 Acquired absence of right leg above knee: Secondary | ICD-10-CM | POA: Diagnosis not present

## 2020-11-23 DIAGNOSIS — R52 Pain, unspecified: Secondary | ICD-10-CM

## 2020-11-23 DIAGNOSIS — I5042 Chronic combined systolic (congestive) and diastolic (congestive) heart failure: Secondary | ICD-10-CM | POA: Diagnosis not present

## 2020-11-23 NOTE — Progress Notes (Signed)
[facility] Consult Note Telephone: [phone]  Fax: [phone]    Date of encounter: 11/23/20 PATIENT NAME: Patrick Anthony [address] [place] [address]   [phone] (home)  DOB: 11/06/1970 MRN: [id number] PRIMARY CARE PROVIDER:    [name], [place],  [address] [place] [address] [phone]  REFERRING PROVIDER:   Vennie Homans, NP RESPONSIBLE PARTY:    Contact Information    Name Relation Home Work [place], [place] Daughter   [phone]   [place], [place] Daughter   [phone]   Jameis, [place]   [phone]       I met face to face with patient in the facility. Palliative Care was asked to follow this patient by consultation request of  Vennie Homans, NP to address advance care planning and complex medical decision making. This is the initial visit.                                     ASSESSMENT AND PLAN / RECOMMENDATIONS:   Advance Care Planning/Goals of Care: Goals include to maximize quality of life and symptom management. Continue therapy with hopes to return home independently. Our advance care planning conversation included a discussion about:     The value and importance of advance care planning   Experiences with loved ones who have been seriously ill or have died   Exploration of personal, cultural or spiritual beliefs that might influence medical decisions   Exploration of goals of care in the event of a sudden injury or illness   Identification and preparation of a healthcare agent   MOST form reviewed; no changes  CODE STATUS: Full Code  NP explained differences between Palliative Medicine and Hospice. Patient is agreeable to Palliative Medicine in the community.   Symptom Management/Plan:  ESRD-patient currently receiving hemodialysis on Monday/Wednesday/Friday. Continue HD as directed. Continue calcium acetate, cinacalect, sevelamer as directed.Follow  up with Nephrology as scheduled.   Chronic systolic and diastolic heart failure-EF 55-60%. Dyspnea with exertion and orthopnea;  Recommend HOB elevated. Discussed with facility NP Ron Agee, decreasing clonidine due to hypotension. Continue metoprolol as directed.   S/P right AKA-daily wound care per facility staff as directed. Follow up with ortho as scheduled. OT/PT as directed. W/c for locomotion or ambulation with cane and assistance of staff for safety.   Pain-patient with back pain, muscle spasms, generalized pain. S/p right AKA. Continue oxycodone 69m BID, pregabalin 773mdaily, methocarbamol 50055mID for muscle spasms; hydromorphone 2mg69mery 4 hours PRN severe pain.   Acute bacterial endocarditis-s/p mitral valve replacement on 10/14/20; patient to receive Vancomycin at dialysis on MWF through 11/25/20 per ID. Blood cultures have been negative Follow up labs scheduled. Follow up with Cardiology and ID as scheduled.   Follow up Palliative Care Visit: Palliative care will continue to follow for complex medical decision making, advance care planning, and clarification of goals. Return in 4 weeks or prn.  I spent 45 minutes providing this consultation. More than 50% of the time in this consultation was spent in counseling and care coordination.   PPS: 40%  HOSPICE ELIGIBILITY/DIAGNOSIS: TBD  Chief Complaint: Palliative Medicine initial consult; ESRD.  HISTORY OF PRESENT ILLNESS:  Patrick Anthony 49 y44. year old male  with ESRD, receiving hemodialysis, chronic combined systolic and diastolic congestive heart failure, s/p right AKA on 10/29/20  secondary to  osteomyelitis and abscess of right tibia. Lengthy hospitalization 0/03/70- 4/8.88; complicated stay with sepsis, pneumonia,  Mrsa bacteremia and acute bacterial endocarditis.   Patient receiving sitting up to w/c in his room. He states he is doing well. He is currently receiving OT/PT services. He is ambulating short distances with  assistance and use of cane. No falls reported. He denies LE pain; he does endorse back pain. Reports shortness of breath with exertion or when lying flat. He reports an approximate 50 pound weight loss in the past 3-4 months.   History obtained from review of EMR, discussion with primary team, and interview with family, facility staff/caregiver and/or Patrick Anthony.  I reviewed available labs, medications, imaging, studies and related documents from the EMR.  Records reviewed and summarized above.   ROS   General: NAD EYES: denies vision changes ENMT: denies dysphagia Cardiovascular: denies chest pain, denies DOE Pulmonary: denies cough, denies increased SOB Abdomen: endorses fair appetite, denies constipation, endorses continence of bowel GU: anuric MSK: weakness, no falls reported Skin: denies rashes or wounds Neurological: back pain, denies insomnia Psych: Endorses positive mood Heme/lymph/immuno: denies bruises, abnormal bleeding  Physical Exam: Pulse 88, resp 16, b/p 96/58, sats 99 % on room air Current and past weights: reports 50 pound weight loss over 3-4 months Constitutional: NAD General: well groomed, nourished EYES: anicteric sclera, lids intact, no discharge  ENMT: intact hearing, oral mucous membranes moist, dentition intact CV: S1S2, RRR, mild edema to right thigh/stump; HD access left arm + bruit, thrill Pulmonary: LCTA, no increased work of breathing, no cough, room air Abdomen: normo-active BS + 4 quadrants, soft and non tender GU: deferred MSK: uses w/c also ambulatory w/cane  Skin: warm and dry, no rashes or wounds on visible skin, Right AKA, dressing CDI Neuro:  no generalized weakness Psych: non-anxious affect, A and O x 3 Hem/lymph/immuno: no widespread bruising   CURRENT PROBLEM LIST:  Patient Active Problem List   Diagnosis Date Noted  . Moderate protein-calorie malnutrition ([place]) 11/11/2020  . Other acute osteomyelitis, right tibia and fibula ([place])  11/11/2020  . S/P AKA (above knee amputation), right ([place])   . 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 Chronic osteomyelitis of right tibia with draining sinus ([place])   . Abscess of right leg   . S/P mitral valve replacement   . S/P mitral valve repair   . S/P minimally-invasive mitral valve replacement with bioprosthetic valve 10/14/2020  . Dental caries   . Chronic apical periodontitis   . Chronic periodontitis   . Accretions on teeth   . Mitral regurgitation   . Uncontrolled hypertension   . Patent foramen ovale   . Cerebral embolism with cerebral infarction 09/29/2020  . 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 Bacteremia due to Staphylococcus aureus   . 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 Lactic acidosis 09/27/2020  . Bacterial endocarditis 09/27/2020  . Allergy, unspecified, subsequent encounter 04/15/2020  . Anaphylactic shock, unspecified, subsequent encounter 04/15/2020  . Complication of vascular dialysis catheter 10/31/2019  . Kidney disease 02/04/2019  . Personal history of Methicillin resistant Staphylococcus aureus infection 09/11/2018  . Encounter for immunization 09/07/2017  . Dependence on renal dialysis ([place]) 03/07/2017  . Iron deficiency anemia, unspecified 11/15/2015  . Coagulation defect, unspecified ([place]) 10/08/2015  . Gastrointestinal hemorrhage, unspecified 10/08/2015  . Pain, unspecified 10/08/2015  . Secondary hyperparathyroidism  of renal origin ([place]) 10/08/2015  . End stage renal disease ([place]) 10/17/2013  . Anemia in chronic kidney disease 08/30/2013  . Hepatitis C 08/30/2013  . ESRD on dialysis ([place]) 08/30/2013  . Hypertensive crisis 08/28/2013  . Hypertensive emergency 04/20/2013  . Chronic combined systolic and diastolic congestive heart failure ([place]) 04/19/2013  . HTN (hypertension) 04/19/2013   PAST MEDICAL HISTORY:   Active Ambulatory Problems    Diagnosis Date Noted  . Chronic combined systolic and diastolic congestive heart failure ([place]) 04/19/2013  . HTN (hypertension) 04/19/2013  . Hypertensive emergency 04/20/2013  . Hypertensive crisis 08/28/2013  . Anemia in chronic kidney disease 08/30/2013  . Hepatitis C 08/30/2013  . ESRD on dialysis ([place]) 08/30/2013  . End stage renal disease ([place]) 10/17/2013  . 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 Lactic acidosis 09/27/2020  . Cerebral embolism with cerebral infarction 09/29/2020  . Bacteremia due to Staphylococcus aureus   . Bacterial endocarditis 09/27/2020  . Mitral regurgitation   . Uncontrolled hypertension   . 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 Patent foramen ovale   . Dental caries   . Chronic apical periodontitis   . Chronic periodontitis   . Accretions on teeth   . S/P minimally-invasive mitral valve replacement with bioprosthetic valve 10/14/2020  . S/P mitral valve repair   . Chronic osteomyelitis of right tibia with draining sinus ([place])   . Abscess of right leg   . S/P mitral valve replacement   . 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 S/P AKA (above knee amputation), right ([place])   . 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 Allergy, unspecified, subsequent encounter 04/15/2020  . Anaphylactic shock, unspecified, subsequent encounter 04/15/2020  . Coagulation defect, unspecified ([place]) 10/08/2015  . Complication of vascular dialysis catheter 10/31/2019  . Dependence on renal dialysis ([place]) 03/07/2017  . Gastrointestinal hemorrhage, unspecified 10/08/2015  . Encounter for immunization 09/07/2017  . Moderate protein-calorie malnutrition ([place]) 11/11/2020  . Kidney disease 02/04/2019  . Iron deficiency anemia, unspecified 11/15/2015  . Pain, unspecified 10/08/2015  . Other acute osteomyelitis, right tibia and fibula ([place]) 11/11/2020  .  Personal history of Methicillin resistant Staphylococcus aureus infection 09/11/2018  . Secondary hyperparathyroidism of renal origin ([place]) 10/08/2015   Resolved Ambulatory Problems    Diagnosis Date Noted  . CKD (chronic kidney disease), stage V ([place]) 04/19/2013  . Fluid overload 04/19/2013  . Headache(784.0) 08/28/2013  . Chest pain 08/28/2013  . Chest pain 05/03/2017  . Sepsis ([place]) 09/27/2020  . Pneumonia due to infectious agent 09/27/2020  . Sepsis due to pneumonia ([place]) 09/27/2020  . Abscess   . Fever   . Chills (without fever) 09/27/2020  . Hyperkalemia 11/17/2019  . Hypercalcemia 07/01/2016   Past Medical History:  Diagnosis Date  . Chronic kidney disease   . Cirrhosis of liver ([place])   . Drug addiction ([place])   . ETOH abuse   . Hepatitis   . Hypertension   . Renal disorder renal failure  . Sleep apnea   . Systolic and diastolic CHF, 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[place]) 04/19/2013   SOCIAL HX:  Social History   Tobacco Use  . Smoking status: Former Smoker    Packs/day: 0.20    Types: Cigarettes    Quit date: 10/05/2017    Years since quitting: 3.1  . Smokeless tobacco: Never Used  . Tobacco comment: Quit 3 years ago  Substance Use Topics  .  Alcohol use: No   FAMILY HX:  Family History  Problem Relation Age of Onset  . Hypertension Mother   . Cancer Mother        breast      ALLERGIES: No Known Allergies   PERTINENT MEDICATIONS:  Outpatient Encounter Medications as of 11/23/2020  Medication Sig  . acetaminophen (TYLENOL) 325 MG tablet Take 1-2 tablets (325-650 mg total) by mouth every 6 (six) hours as needed for mild pain (pain score 1-3 or temp > 100.5).  Marland Kitchen aspirin EC 325 MG EC tablet Take 1 tablet (325 mg total) by mouth daily. (Patient not taking: Reported on 11/17/2020)  . AURYXIA 1 GM 210 MG(Fe) tablet Take 420 mg by mouth 3 (three) times daily.  . bisacodyl (DULCOLAX) 10 MG suppository Place 1 suppository (10 mg total) rectally daily as needed for moderate  constipation.  . calcium acetate (PHOSLO) 667 MG capsule Take 1 capsule (667 mg total) by mouth 3 (three) times daily with meals.  Marland Kitchen ceFEPIme 1 g in sodium chloride 0.9 % 100 mL Inject into the vein.  . cinacalcet (SENSIPAR) 30 MG tablet Take 2 tablets (60 mg total) by mouth daily with breakfast.  . cloNIDine (CATAPRES) 0.3 MG tablet Take 0.3 mg by mouth in the morning, at noon, and at bedtime.  . Darbepoetin Alfa (ARANESP, ALBUMIN FREE, IJ) Darbepoetin Alfa (Aranesp)  . docusate sodium (COLACE) 100 MG capsule Take 1 capsule (100 mg total) by mouth 2 (two) times daily.  Marland Kitchen HYDROmorphone (DILAUDID) 2 MG tablet Take 1 tablet (2 mg total) by mouth every 4 (four) hours as needed for severe pain (Not controlled by oxycontin).  . methocarbamol (ROBAXIN) 500 MG tablet Take 1 tablet (500 mg total) by mouth 3 (three) times daily.  . metoprolol tartrate (LOPRESSOR) 50 MG tablet Take 1 tablet (50 mg total) by mouth 2 (two) times daily.  . multivitamin (RENA-VIT) TABS tablet Take 1 tablet by mouth at bedtime.  . nitroGLYCERIN (NITROSTAT) 0.4 MG SL tablet Place 0.4 mg under the tongue every 5 (five) minutes x 3 doses as needed for chest pain.   . Nutritional Supplements (,FEEDING SUPPLEMENT, PROSOURCE PLUS) liquid Take 30 mLs by mouth 2 (two) times daily between meals.  . Nutritional Supplements (FEEDING SUPPLEMENT, NEPRO CARB STEADY,) LIQD Take 237 mLs by mouth 3 (three) times daily between meals.  Marland Kitchen oxyCODONE (OXYCONTIN) 15 mg 12 hr tablet Take 1 tablet (15 mg total) by mouth every 12 (twelve) hours.  . pantoprazole (PROTONIX) 40 MG tablet Take 1 tablet (40 mg total) by mouth daily.  . polyethylene glycol (MIRALAX / GLYCOLAX) 17 g packet Take 17 g by mouth daily as needed for mild constipation.  . pregabalin (LYRICA) 75 MG capsule Take 1 capsule (75 mg total) by mouth at bedtime.  . senna-docusate (SENOKOT-S) 8.6-50 MG tablet Take 1 tablet by mouth 2 (two) times daily.  . vancomycin (VANCOCIN) 1 g injection  Inject into the vein.   Facility-Administered Encounter Medications as of 11/23/2020  Medication  . regadenoson (LEXISCAN) injection SOLN 0.4 mg  . technetium tetrofosmin (TC-MYOVIEW) injection 75.4 millicurie  . technetium tetrofosmin (TC-MYOVIEW) injection 49.2 millicurie    Thank you for the opportunity to participate in the care of Patrick Anthony.  The palliative care team will continue to follow. Please call our office at [phone] if we can be of additional assistance.   Ezekiel Slocumb, NP   COVID-19 PATIENT SCREENING TOOL Asked and negative response unless otherwise noted:   Have  you had symptoms of covid, tested positive or been in contact with someone with symptoms/positive test in the past 5-10 days? No

## 2020-11-24 DIAGNOSIS — A4102 Sepsis due to Methicillin resistant Staphylococcus aureus: Secondary | ICD-10-CM | POA: Diagnosis not present

## 2020-11-24 DIAGNOSIS — N2581 Secondary hyperparathyroidism of renal origin: Secondary | ICD-10-CM | POA: Diagnosis not present

## 2020-11-24 DIAGNOSIS — Z5181 Encounter for therapeutic drug level monitoring: Secondary | ICD-10-CM | POA: Diagnosis not present

## 2020-11-24 DIAGNOSIS — N186 End stage renal disease: Secondary | ICD-10-CM | POA: Diagnosis not present

## 2020-11-24 DIAGNOSIS — E875 Hyperkalemia: Secondary | ICD-10-CM | POA: Diagnosis not present

## 2020-11-24 DIAGNOSIS — Z992 Dependence on renal dialysis: Secondary | ICD-10-CM | POA: Diagnosis not present

## 2020-11-24 DIAGNOSIS — D689 Coagulation defect, unspecified: Secondary | ICD-10-CM | POA: Diagnosis not present

## 2020-11-26 DIAGNOSIS — A4102 Sepsis due to Methicillin resistant Staphylococcus aureus: Secondary | ICD-10-CM | POA: Diagnosis not present

## 2020-11-26 DIAGNOSIS — N186 End stage renal disease: Secondary | ICD-10-CM | POA: Diagnosis not present

## 2020-11-26 DIAGNOSIS — Z5181 Encounter for therapeutic drug level monitoring: Secondary | ICD-10-CM | POA: Diagnosis not present

## 2020-11-26 DIAGNOSIS — D689 Coagulation defect, unspecified: Secondary | ICD-10-CM | POA: Diagnosis not present

## 2020-11-26 DIAGNOSIS — Z992 Dependence on renal dialysis: Secondary | ICD-10-CM | POA: Diagnosis not present

## 2020-11-26 DIAGNOSIS — N2581 Secondary hyperparathyroidism of renal origin: Secondary | ICD-10-CM | POA: Diagnosis not present

## 2020-11-26 DIAGNOSIS — E875 Hyperkalemia: Secondary | ICD-10-CM | POA: Diagnosis not present

## 2020-11-29 DIAGNOSIS — N2581 Secondary hyperparathyroidism of renal origin: Secondary | ICD-10-CM | POA: Diagnosis not present

## 2020-11-29 DIAGNOSIS — D689 Coagulation defect, unspecified: Secondary | ICD-10-CM | POA: Diagnosis not present

## 2020-11-29 DIAGNOSIS — D631 Anemia in chronic kidney disease: Secondary | ICD-10-CM | POA: Diagnosis not present

## 2020-11-29 DIAGNOSIS — Z5181 Encounter for therapeutic drug level monitoring: Secondary | ICD-10-CM | POA: Diagnosis not present

## 2020-11-29 DIAGNOSIS — E875 Hyperkalemia: Secondary | ICD-10-CM | POA: Diagnosis not present

## 2020-11-29 DIAGNOSIS — N186 End stage renal disease: Secondary | ICD-10-CM | POA: Diagnosis not present

## 2020-11-29 DIAGNOSIS — Z992 Dependence on renal dialysis: Secondary | ICD-10-CM | POA: Diagnosis not present

## 2020-11-30 ENCOUNTER — Other Ambulatory Visit: Payer: Self-pay

## 2020-11-30 ENCOUNTER — Ambulatory Visit (HOSPITAL_COMMUNITY): Payer: Medicaid Other | Attending: Cardiology

## 2020-11-30 DIAGNOSIS — Z953 Presence of xenogenic heart valve: Secondary | ICD-10-CM | POA: Diagnosis not present

## 2020-11-30 DIAGNOSIS — I340 Nonrheumatic mitral (valve) insufficiency: Secondary | ICD-10-CM | POA: Diagnosis not present

## 2020-11-30 DIAGNOSIS — Q211 Atrial septal defect: Secondary | ICD-10-CM | POA: Insufficient documentation

## 2020-11-30 DIAGNOSIS — Q2112 Patent foramen ovale: Secondary | ICD-10-CM

## 2020-11-30 LAB — ECHOCARDIOGRAM COMPLETE
AR max vel: 1.14 cm2
AV Area VTI: 1.17 cm2
AV Area mean vel: 1.18 cm2
AV Mean grad: 10.3 mmHg
AV Peak grad: 19.6 mmHg
Ao pk vel: 2.21 m/s
Area-P 1/2: 2.99 cm2
MV VTI: 0.92 cm2
S' Lateral: 2.5 cm

## 2020-12-01 DIAGNOSIS — Z992 Dependence on renal dialysis: Secondary | ICD-10-CM | POA: Diagnosis not present

## 2020-12-01 DIAGNOSIS — E875 Hyperkalemia: Secondary | ICD-10-CM | POA: Diagnosis not present

## 2020-12-01 DIAGNOSIS — Z5181 Encounter for therapeutic drug level monitoring: Secondary | ICD-10-CM | POA: Diagnosis not present

## 2020-12-01 DIAGNOSIS — D631 Anemia in chronic kidney disease: Secondary | ICD-10-CM | POA: Diagnosis not present

## 2020-12-01 DIAGNOSIS — N186 End stage renal disease: Secondary | ICD-10-CM | POA: Diagnosis not present

## 2020-12-01 DIAGNOSIS — D689 Coagulation defect, unspecified: Secondary | ICD-10-CM | POA: Diagnosis not present

## 2020-12-01 DIAGNOSIS — N2581 Secondary hyperparathyroidism of renal origin: Secondary | ICD-10-CM | POA: Diagnosis not present

## 2020-12-02 ENCOUNTER — Encounter: Payer: Self-pay | Admitting: Orthopedic Surgery

## 2020-12-02 ENCOUNTER — Ambulatory Visit (INDEPENDENT_AMBULATORY_CARE_PROVIDER_SITE_OTHER): Payer: Medicaid Other | Admitting: Orthopedic Surgery

## 2020-12-02 DIAGNOSIS — Z89611 Acquired absence of right leg above knee: Secondary | ICD-10-CM

## 2020-12-02 NOTE — Progress Notes (Signed)
Office Visit Note   Patient: Patrick Anthony           Date of Birth: 09-Mar-1971           MRN: [id number] Visit Date: 12/02/2020              Requested by: [name], MD [address] [place],  [place] [address] PCP: [name], MD  Chief Complaint  Patient presents with  . Right Leg - Routine Post Op    10/29/20 right AKA      HPI: Patient is a 50 year old gentleman is 4 weeks status post right above-knee amputation he is currently at [facility] skilled nursing facility.  Patient states he has not received his stump shrinker he states it is difficult to get wound care and that he often has to change the dressing on his own.  Assessment & Plan: Visit Diagnoses:  1. S/P AKA (above knee amputation) unilateral, right ([place])     Plan: Patient is given a prescription for [employer] for stump shrinker and a prosthesis on the right.  Patient was also given a prescription that when he is discharged from skilled nursing he will need a ground-level apartment.  Follow-Up Instructions: Return in about 2 months (around 02/01/2021).   Ortho Exam  Patient is alert, oriented, no adenopathy, well-dressed, normal affect, normal respiratory effort. Examination the above-the-knee amputation is healed well there is no drainage no cellulitis the residual limb does need shaping and consolidation with a stump shrinker.  Patient is a new right transfemoral amputee.  Patient's current comorbidities are not expected to impact the ability to function with the prescribed prosthesis. Patient verbally communicates a strong desire to use a prosthesis. Patient currently requires mobility aids to ambulate without a prosthesis.  Expects not to use mobility aids with a new prosthesis.  Patient is a K2 level ambulator that will use a prosthesis to walk around their home and the community over low level environmental barriers.     Imaging: No results found. No images are attached to the  encounter.  Labs: Lab Results  Component Value Date   HGBA1C 5.5 09/29/2020   HGBA1C 5.4 09/28/2020   HGBA1C 4.6 08/28/2013   ESRSEDRATE 102 (H) 11/05/2020   CRP 28.8 (H) 11/05/2020   REPTSTATUS 11/10/2020 FINAL 11/05/2020   GRAMSTAIN  10/20/2020    RARE WBC PRESENT,BOTH PMN AND MONONUCLEAR NO ORGANISMS SEEN    CULT  11/05/2020    NO GROWTH 5 DAYS Performed at [facility], [place] [address] [place], [place] [address]    LABORGA METHICILLIN RESISTANT STAPHYLOCOCCUS AUREUS 10/20/2020     Lab Results  Component Value Date   ALBUMIN 2.1 (L) 11/10/2020   ALBUMIN 2.1 (L) 11/09/2020   ALBUMIN 2.1 (L) 11/08/2020   PREALBUMIN 10.8 (L) 10/13/2020   PREALBUMIN 12.9 (L) 10/07/2020    Lab Results  Component Value Date   MG 2.0 10/15/2020   MG 2.6 (H) 10/15/2020   MG 2.6 (H) 10/14/2020   No results found for: VD25OH  Lab Results  Component Value Date   PREALBUMIN 10.8 (L) 10/13/2020   PREALBUMIN 12.9 (L) 10/07/2020   CBC EXTENDED Latest Ref Rng & Units 11/10/2020 11/09/2020 11/08/2020  WBC 4.0 - 10.5 K/uL 18.4(H) 15.8(H) 16.6(H)  RBC 4.22 - 5.81 MIL/uL 3.12(L) 3.34(L) 2.90(L)  HGB 13.0 - 17.0 g/dL 8.4(L) 9.0(L) 7.9(L)  HCT 39.0 - 52.0 % 27.8(L) 29.4(L) 25.3(L)  PLT 150 - 400 K/uL 540(H) 590(H) 547(H)  NEUTROABS 1.7 - 7.7 K/uL 12.3(H) 10.2(H) 11.2(H)  LYMPHSABS 0.7 - 4.0 K/uL 1.5 1.8 1.8     There is no height or weight on file to calculate BMI.  Orders:  No orders of the defined types were placed in this encounter.  No orders of the defined types were placed in this encounter.    Procedures: No procedures performed  Clinical Data: No additional findings.  ROS:  All other systems negative, except as noted in the HPI. Review of Systems  Objective: Vital Signs: There were no vitals taken for this visit.  Specialty Comments:  No specialty comments available.  PMFS History: Patient Active Problem List   Diagnosis Date Noted  . Moderate protein-calorie  malnutrition ([place]) 11/11/2020  . Other acute osteomyelitis, right tibia and fibula ([place]) 11/11/2020  . S/P AKA (above knee amputation), right ([place])   . 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 Chronic osteomyelitis of right tibia with draining sinus ([place])   . Abscess of right leg   . S/P mitral valve replacement   . S/P mitral valve repair   . S/P minimally-invasive mitral valve replacement with bioprosthetic valve 10/14/2020  . Dental caries   . Chronic apical periodontitis   . Chronic periodontitis   . Accretions on teeth   . Mitral regurgitation   . Uncontrolled hypertension   . Patent foramen ovale   . Cerebral embolism with cerebral infarction 09/29/2020  . 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 Bacteremia due to Staphylococcus aureus   . 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 Lactic acidosis 09/27/2020  . Bacterial endocarditis 09/27/2020  . Allergy, unspecified, subsequent encounter 04/15/2020  . Anaphylactic shock, unspecified, subsequent encounter 04/15/2020  . Complication of vascular dialysis catheter 10/31/2019  . Kidney disease 02/04/2019  . Personal history of Methicillin resistant Staphylococcus aureus infection 09/11/2018  . Encounter for immunization 09/07/2017  . Dependence on renal dialysis ([place]) 03/07/2017  . Iron deficiency anemia, unspecified 11/15/2015  . Coagulation defect, unspecified ([place]) 10/08/2015  . Gastrointestinal hemorrhage, unspecified 10/08/2015  . Pain, unspecified 10/08/2015  . Secondary hyperparathyroidism of renal origin ([place]) 10/08/2015  . End stage renal disease ([place]) 10/17/2013  . Anemia in chronic kidney disease 08/30/2013  . Hepatitis C 08/30/2013  . ESRD on dialysis ([place]) 08/30/2013  . Hypertensive crisis 08/28/2013  . Hypertensive emergency 04/20/2013  . Chronic combined systolic and diastolic congestive  heart failure ([place]) 04/19/2013  . HTN (hypertension) 04/19/2013   Past Medical History:  Diagnosis Date  . 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 Chronic kidney disease    patient on dialysis tues-thurs-sat  . Cirrhosis of liver ([place])   . Drug addiction ([place])   . ETOH abuse   . Hepatitis   . Hypertension   . Mitral regurgitation   . Patent foramen ovale 10/23/2019  . Renal disorder renal failure  . S/P minimally-invasive mitral valve replacement with bioprosthetic valve 10/14/2020   31 mm Medtronic Mosaic stented porcine bioprosthetic tissue valve via right mini thoracotomy approach  . Sleep apnea   . Systolic and diastolic CHF, 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[place]) 04/19/2013    Family History  Problem Relation Age of Onset  . Hypertension Mother   . Cancer Mother  breast    Past Surgical History:  Procedure Laterality Date  . AMPUTATION Right 10/29/2020   Procedure: RIGHT ABOVE KNEE AMPUTATION;  Surgeon: [name], MD;  Location: [facility];  Service: Orthopedics;  Laterality: Right;  . AV FISTULA PLACEMENT Right   . AV FISTULA PLACEMENT Left 09/03/2013   Procedure: ARTERIOVENOUS (AV) FISTULA CREATION - LEFT BRACHIOCEPHALIC;  Surgeon: Conrad [place]ville, MD;  Location[facility];  Service: Vascular;  Laterality: Left;  . AV FISTULA PLACEMENT Left 10/30/2019   Procedure: INSERTION OF ARTERIOVENOUS (AV) GORE-TEX GRAFT ARM;  Surgeon: Rosetta Posner, MD;  Location: [facility];  Service: Vascular;  Laterality: Left;  . BUBBLE STUDY  10/05/2020   Procedure: BUBBLE STUDY;  Surgeon: Pixie Casino, MD;  Location: [facility];  Service: Cardiovascular;;  . I & D EXTREMITY Right 10/20/2020   Procedure: IRRIGATION AND DEBRIDEMENT OF LEG;  Surgeon: [name], MD;  Location: [facility];  Service: Orthopedics;  Laterality: Right;  . INSERTION OF DIALYSIS CATHETER N/A 08/29/2013   Procedure: INSERTION OF DIALYSIS CATHETER;   Surgeon: Mal Misty, MD;  Location: [facility];  Service: Vascular;  Laterality: N/A;  . INSERTION OF DIALYSIS CATHETER Left 10/30/2019   Procedure: INSERTION OF DIALYSIS CATHETER LEFT INTERNAL JUGULAR;  Surgeon: Rosetta Posner, MD;  Location: [facility];  Service: Vascular;  Laterality: Left;  . LEFT HEART CATH AND CORONARY ANGIOGRAPHY N/A 10/07/2020   Procedure: LEFT HEART CATH AND CORONARY ANGIOGRAPHY;  Surgeon: Lorretta Harp, MD;  Location: [facility];  Service: Cardiovascular;  Laterality: N/A;  . MITRAL VALVE REPAIR Right 10/14/2020   Procedure: MINIMALLY INVASIVE MITRAL VALVE  REPLACEMENT(MVR) USING MEDTRONIC MOSAIC VALVE SIZE 31MM;  Surgeon: Rexene Alberts, MD;  Location: [facility];  Service: Open Heart Surgery;  Laterality: Right;  . MULTIPLE EXTRACTIONS WITH ALVEOLOPLASTY N/A 10/12/2020   Procedure: MULTIPLE EXTRACTION WITH ALVEOLOPLASTY;  Surgeon: [name], DMD;  Location: [facility];  Service: Dentistry;  Laterality: N/A;  . PERIPHERAL VASCULAR CATHETERIZATION N/A 01/06/2015   Procedure: A/V Shuntogram/Fistulagram;  Surgeon: Katha Cabal, MD;  Location: [facility];  Service: Cardiovascular;  Laterality: N/A;  . PERIPHERAL VASCULAR CATHETERIZATION Left 01/06/2015   Procedure: A/V Shunt Intervention;  Surgeon: Katha Cabal, MD;  Location: [facility];  Service: Cardiovascular;  Laterality: Left;  . REPAIR OF PATENT FORAMEN OVALE N/A 10/14/2020   Procedure: CLOSURE OF PATENT FORAMEN OVALE;  Surgeon: Rexene Alberts, MD;  Location: [facility];  Service: Open Heart Surgery;  Laterality: N/A;  . TEE WITHOUT CARDIOVERSION N/A 10/05/2020   Procedure: TRANSESOPHAGEAL ECHOCARDIOGRAM (TEE);  Surgeon: Pixie Casino, MD;  Location: [facility];  Service: Cardiovascular;  Laterality: N/A;  . TEE WITHOUT CARDIOVERSION N/A 10/14/2020   Procedure: TRANSESOPHAGEAL ECHOCARDIOGRAM (TEE);  Surgeon: Rexene Alberts, MD;  Location: [facility];  Service: Open Heart Surgery;  Laterality: N/A;    Social History   Occupational History  . Not on file  Tobacco Use  . Smoking status: Former Smoker    Packs/day: 0.20    Types: Cigarettes    Quit date: 10/05/2017    Years since quitting: 3.1  . Smokeless tobacco: Never Used  . Tobacco comment: Quit 3 years ago  Vaping Use  . Vaping Use: Never used  Substance and Sexual Activity  . Alcohol use: No  . Drug use: No    Comment: Quit  . Sexual activity: Not Currently    Birth control/protection: Condom

## 2020-12-03 ENCOUNTER — Other Ambulatory Visit: Payer: Self-pay

## 2020-12-03 DIAGNOSIS — N186 End stage renal disease: Secondary | ICD-10-CM | POA: Diagnosis not present

## 2020-12-03 DIAGNOSIS — Z5181 Encounter for therapeutic drug level monitoring: Secondary | ICD-10-CM | POA: Diagnosis not present

## 2020-12-03 DIAGNOSIS — D689 Coagulation defect, unspecified: Secondary | ICD-10-CM | POA: Diagnosis not present

## 2020-12-03 DIAGNOSIS — N2581 Secondary hyperparathyroidism of renal origin: Secondary | ICD-10-CM | POA: Diagnosis not present

## 2020-12-03 DIAGNOSIS — E875 Hyperkalemia: Secondary | ICD-10-CM | POA: Diagnosis not present

## 2020-12-03 DIAGNOSIS — Z992 Dependence on renal dialysis: Secondary | ICD-10-CM | POA: Diagnosis not present

## 2020-12-03 DIAGNOSIS — D631 Anemia in chronic kidney disease: Secondary | ICD-10-CM | POA: Diagnosis not present

## 2020-12-04 DIAGNOSIS — N186 End stage renal disease: Secondary | ICD-10-CM | POA: Diagnosis not present

## 2020-12-04 DIAGNOSIS — I129 Hypertensive chronic kidney disease with stage 1 through stage 4 chronic kidney disease, or unspecified chronic kidney disease: Secondary | ICD-10-CM | POA: Diagnosis not present

## 2020-12-04 DIAGNOSIS — Z992 Dependence on renal dialysis: Secondary | ICD-10-CM | POA: Diagnosis not present

## 2020-12-07 DIAGNOSIS — B9562 Methicillin resistant Staphylococcus aureus infection as the cause of diseases classified elsewhere: Secondary | ICD-10-CM | POA: Diagnosis not present

## 2020-12-07 DIAGNOSIS — N186 End stage renal disease: Secondary | ICD-10-CM | POA: Diagnosis not present

## 2020-12-07 DIAGNOSIS — R7881 Bacteremia: Secondary | ICD-10-CM | POA: Diagnosis not present

## 2020-12-07 DIAGNOSIS — I330 Acute and subacute infective endocarditis: Secondary | ICD-10-CM | POA: Diagnosis not present

## 2020-12-10 ENCOUNTER — Other Ambulatory Visit: Payer: Self-pay | Admitting: Thoracic Surgery (Cardiothoracic Vascular Surgery)

## 2020-12-10 DIAGNOSIS — Z9889 Other specified postprocedural states: Secondary | ICD-10-CM

## 2020-12-13 ENCOUNTER — Ambulatory Visit
Admission: RE | Admit: 2020-12-13 | Discharge: 2020-12-13 | Disposition: A | Discharge status: Home / Self Care | Payer: Medicaid Other | Source: Ambulatory Visit | Attending: Thoracic Surgery (Cardiothoracic Vascular Surgery) | Admitting: Thoracic Surgery (Cardiothoracic Vascular Surgery)

## 2020-12-13 ENCOUNTER — Ambulatory Visit (INDEPENDENT_AMBULATORY_CARE_PROVIDER_SITE_OTHER): Payer: Self-pay | Admitting: Surgical

## 2020-12-13 ENCOUNTER — Other Ambulatory Visit: Payer: Self-pay

## 2020-12-13 ENCOUNTER — Encounter: Payer: Self-pay | Admitting: Surgical

## 2020-12-13 ENCOUNTER — Other Ambulatory Visit: Payer: Self-pay | Admitting: Thoracic Surgery (Cardiothoracic Vascular Surgery)

## 2020-12-13 VITALS — BP 124/95 | HR 77 | Temp 97.7°F | Resp 20 | Ht 72.0 in | Wt 176.4 lb

## 2020-12-13 DIAGNOSIS — N2581 Secondary hyperparathyroidism of renal origin: Secondary | ICD-10-CM | POA: Diagnosis not present

## 2020-12-13 DIAGNOSIS — D631 Anemia in chronic kidney disease: Secondary | ICD-10-CM | POA: Diagnosis not present

## 2020-12-13 DIAGNOSIS — Z9889 Other specified postprocedural states: Secondary | ICD-10-CM

## 2020-12-13 DIAGNOSIS — I517 Cardiomegaly: Secondary | ICD-10-CM | POA: Diagnosis not present

## 2020-12-13 DIAGNOSIS — Z952 Presence of prosthetic heart valve: Secondary | ICD-10-CM

## 2020-12-13 DIAGNOSIS — J90 Pleural effusion, not elsewhere classified: Secondary | ICD-10-CM | POA: Diagnosis not present

## 2020-12-13 DIAGNOSIS — Z5181 Encounter for therapeutic drug level monitoring: Secondary | ICD-10-CM | POA: Diagnosis not present

## 2020-12-13 DIAGNOSIS — J984 Other disorders of lung: Secondary | ICD-10-CM | POA: Diagnosis not present

## 2020-12-13 DIAGNOSIS — D689 Coagulation defect, unspecified: Secondary | ICD-10-CM | POA: Diagnosis not present

## 2020-12-13 DIAGNOSIS — Z992 Dependence on renal dialysis: Secondary | ICD-10-CM | POA: Diagnosis not present

## 2020-12-13 DIAGNOSIS — N186 End stage renal disease: Secondary | ICD-10-CM | POA: Diagnosis not present

## 2020-12-13 DIAGNOSIS — J9811 Atelectasis: Secondary | ICD-10-CM | POA: Diagnosis not present

## 2020-12-13 DIAGNOSIS — E875 Hyperkalemia: Secondary | ICD-10-CM | POA: Diagnosis not present

## 2020-12-13 IMAGING — DX DG CHEST 1V
1 series · 1 of 1 positions shown · non-contrast
Comparison: [DATE]

CLINICAL DATA: Status post mitral valve replacement

EXAM:
CHEST  1 VIEW

[view not recorded]
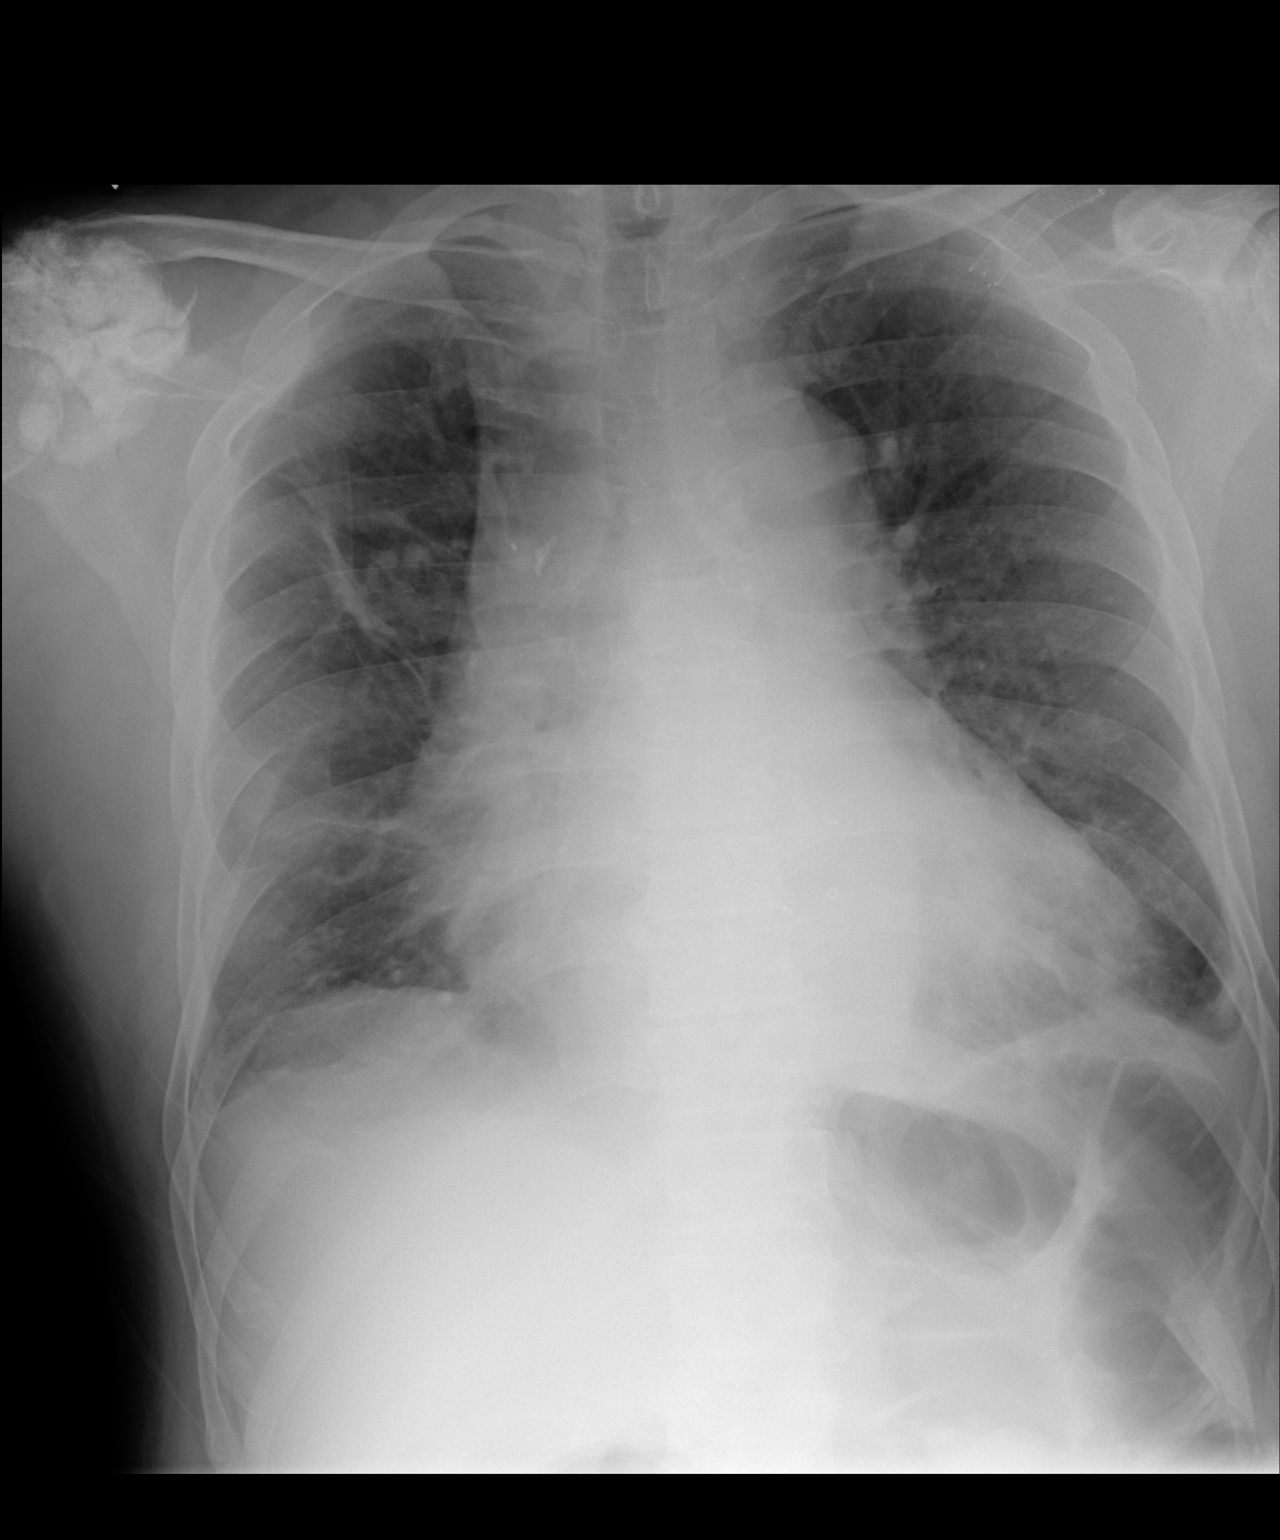

[1 of 1 positions shown; findings below may reference images not displayed]

FINDINGS: There is scarring in the right upper lobe. There is mild right
midlung atelectasis. There is a small left pleural effusion. There
is no edema or consolidation. There is cardiomegaly with pulmonary
vascularity normal. Status post aortic valve replacement. No
adenopathy. Stent in left subclavian region.
IMPRESSION: Scarring right upper lobe. Mild right midlung atelectasis. Small
left pleural effusion. No consolidation.

Stable cardiomegaly.  Status post mitral valve replacement.

## 2020-12-13 NOTE — Assessment & Plan Note (Signed)
Doing well in this regard

## 2020-12-13 NOTE — Patient Instructions (Signed)
Routine management of post op surgical recovery from MVR

## 2020-12-13 NOTE — Progress Notes (Signed)
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 Active bacterial endocarditis with bulky vegetation ? Perforation of posterior leaflet ? Moderate posterior mitral annular calcification ? Type I dysfunction with moderate mitral regurgitation ? Normal left ventricular systolic function   History of Present Illness:    The patient is a 50year old male status post the above described procedure seen in the office on today's in postsurgical follow-up.  He currently is in a rehab facility.  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  That was done by Dr. MMeridee Score  Currently he reports that he has some initial shortness of breath when awakening but it resolves pretty quickly.  Other than that he overall feels well and denies any significant symptoms of congestive heart failure.  He continues to get dialysis which he has been on for  approximately 6 years.  He has undergone a prolonged course of intravenous antibiotics as dictated through the ID service.  He has had no difficulties with his minithoracotomy incision.  He does report a small area of redness associated with the amputation but no drainage and reportedly this is doing well.  He will of course follow-up with Dr. DSharol Givenlong-term for this as it heals.  He is hoping to eventually be able to get a prosthetic limb.      Past Medical History:  Diagnosis Date  . 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 Chronic kidney disease    patient on dialysis tues-thurs-sat  . Cirrhosis of liver ([facility]   . [place]   . ETOH abuse   . Hepatitis   . Hypertension   . Mitral regurgitation   . Patent foramen ovale 10/23/2019  . Renal disorder renal failure  . S/P minimally-invasive mitral valve replacement with bioprosthetic valve 10/14/2020   31 mm Medtronic Mosaic stented porcine bioprosthetic tissue valve via right mini thoracotomy approach  . Sleep apnea   . 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 Packs/day:  0.20  . Types: Cigarettes  . Quit date: 10/05/2017  . Years since quitting: 3.1  Smokeless Tobacco Never Used  Tobacco Comment   Quit 3 years ago    Social History   Substance and Sexual Activity  Alcohol Use No     No Known Allergies  Current Outpatient Medications  Medication Sig Dispense Refill  . acetaminophen (TYLENOL) 325 MG tablet Take 1-2 tablets (325-650 mg total) by mouth every 6 (six) hours as needed for mild pain (pain score 1-3 or temp > 100.5).    Marland Kitchen aspirin EC 325 MG EC tablet Take 1 tablet (325 mg total) by mouth daily. 30 tablet 0  . AURYXIA 1 GM 210 MG(Fe) tablet Take 420 mg by mouth 3 (three) times daily.    Marland Kitchen ceFEPIme 1 g in sodium chloride 0.9  % 100 mL Inject into the vein.    . cinacalcet (SENSIPAR) 30 MG tablet Take 2 tablets (60 mg total) by mouth daily with breakfast. 60 tablet   . cloNIDine (CATAPRES) 0.3 MG tablet Take 0.3 mg by mouth in the morning, at noon, and at bedtime.    . Darbepoetin Alfa (ARANESP, ALBUMIN FREE, IJ) Darbepoetin Alfa (Aranesp)    . HYDROmorphone (DILAUDID) 2 MG tablet Take 1 tablet (2 mg total) by mouth every 4 (four) hours as needed for severe pain (Not controlled by oxycontin). 30 tablet 0  . methocarbamol (ROBAXIN) 500 MG tablet Take 1 tablet (500 mg total) by mouth 3 (three) times daily.    . metoprolol tartrate (LOPRESSOR) 50 MG tablet Take 1 tablet (50 mg total) by mouth 2 (two) times daily.    . multivitamin (RENA-VIT) TABS tablet Take 1 tablet by mouth at bedtime.  0  . nitroGLYCERIN (NITROSTAT) 0.4 MG SL tablet Place 0.4 mg under the tongue every 5 (five) minutes x 3 doses as needed for chest pain.     . Nutritional Supplements (,FEEDING SUPPLEMENT, PROSOURCE PLUS) liquid Take 30 mLs by mouth 2 (two) times daily between meals.    . Nutritional Supplements (FEEDING SUPPLEMENT, NEPRO CARB STEADY,) LIQD Take 237 mLs by mouth 3 (three) times daily between meals.  0  . oxyCODONE (OXYCONTIN) 15 mg 12 hr tablet Take 1 tablet (15 mg total) by mouth every 12 (twelve) hours. 14 tablet 0  . pantoprazole (PROTONIX) 40 MG tablet Take 1 tablet (40 mg total) by mouth daily.    . pregabalin (LYRICA) 75 MG capsule Take 1 capsule (75 mg total) by mouth at bedtime. 60 capsule 0  . bisacodyl (DULCOLAX) 10 MG suppository Place 1 suppository (10 mg total) rectally daily as needed for moderate constipation. (Patient not taking: Reported on 12/13/2020) 12 suppository 0  . calcium acetate (PHOSLO) 667 MG capsule Take 1 capsule (667 mg total) by mouth 3 (three) times daily with meals. (Patient not taking: Reported on 12/13/2020)    . docusate sodium (COLACE) 100 MG capsule Take 1 capsule (100 mg total) by mouth 2 (two) times  daily. (Patient not taking: Reported on 12/13/2020) 10 capsule 0  . polyethylene glycol (MIRALAX / GLYCOLAX) 17 g packet Take 17 g by mouth daily as needed for mild constipation. (Patient not taking: Reported on 12/13/2020) 14 each 0  . senna-docusate (SENOKOT-S) 8.6-50 MG tablet Take 1 tablet by mouth 2 (two) times daily. (Patient not taking: Reported on 12/13/2020)     No current facility-administered medications for this visit.   Facility-Administered Medications Ordered in Other Visits  Medication Dose Route Frequency Provider Last  Rate Last Admin  . regadenoson (LEXISCAN) injection SOLN 0.4 mg  0.4 mg Intravenous Once Cherlynn Kaiser A, MD      . technetium tetrofosmin (TC-MYOVIEW) injection [id number] millicurie  [id number] millicurie Intravenous Once PRN Cherlynn Kaiser A, MD      . technetium tetrofosmin (TC-MYOVIEW) injection [id number] millicurie  [id number] millicurie Intravenous Once PRN Elouise Munroe, MD           Physical Exam: BP (!) 124/95 (BP Location: Right Arm, Patient Position: Sitting)   Pulse 77   Temp 97.7 F (36.5 C)   Resp 20   Ht 6' (1.829 m)   Wt 176 lb 5.9 oz (80 kg)   SpO2 95% Comment: RA  BMI 23.92 kg/m   General appearance: alert, cooperative and no distress Heart: regular rate and rhythm, no rub and no murmur Lungs: clear to auscultation bilaterally Abdomen: benign exam Extremities: no edema on ledt, R AKA dressing CDI Wound: mini - thoracotomy incision well healed   Diagnostic Studies & Laboratory data:     Recent Radiology Findings:   DG Chest 1 View  Result Date: 12/13/2020 CLINICAL DATA:  Status post mitral valve replacement EXAM: CHEST  1 VIEW COMPARISON:  November 05, 2020 FINDINGS: There is scarring in the right upper lobe. There is mild right midlung atelectasis. There is a small left pleural effusion. There is no edema or consolidation. There is cardiomegaly with pulmonary vascularity normal. Status post aortic valve replacement. No adenopathy. Stent in left  subclavian region. IMPRESSION: Scarring right upper lobe. Mild right midlung atelectasis. Small left pleural effusion. No consolidation. Stable cardiomegaly.  Status post mitral valve replacement. Electronically Signed   By: Lowella Grip III M.D.   On: 12/13/2020 13:41      Recent Lab Findings: Lab Results  Component Value Date   WBC 18.4 (H) 11/10/2020   HGB 8.4 (L) 11/10/2020   HCT 27.8 (L) 11/10/2020   PLT 540 (H) 11/10/2020   GLUCOSE 97 11/10/2020   CHOL 126 09/29/2020   TRIG 342 (H) 09/29/2020   HDL <10 (L) 09/29/2020   LDLDIRECT 25.4 09/30/2020   LDLCALC NOT CALCULATED 09/29/2020   ALT 8 11/06/2020   AST 24 11/06/2020   NA 130 (L) 11/10/2020   K 4.3 11/10/2020   CL 94 (L) 11/10/2020   CREATININE 7.04 (H) 11/10/2020   BUN 37 (H) 11/10/2020   CO2 24 11/10/2020   TSH 2.006 08/28/2013   INR 1.5 (H) 10/14/2020   HGBA1C 5.5 09/29/2020     ECHOCARDIOGRAM REPORT       Patient Name:  KOURTNEY HAMBERG Date of Exam: 11/30/2020  Medical Rec #: [id number]   Height:    72.0 in  Accession #:  [id number]   Weight:    163.0 lb  Date of Birth: 04-Oct-1970   BSA:     1.953 m  Patient Age:  76 years    BP:      84/53 mmHg  Patient Gender: M       HR:      84 bpm.  Exam Location: [facility]   Procedure: 2D Echo, Cardiac Doppler and Color Doppler   Indications:  Z95.3 MVR    History:    Patient has prior history of Echocardiogram examinations,  most         recent 09/28/2020. MVR, Signs/Symptoms:PFO; Risk         Factors:Hypertension and ESRD.    Sonographer:  Coralyn Helling RDCS  Referring Phys: [place]  IMPRESSIONS    1. Left ventricular ejection fraction, by estimation, is 60 to 65%. The  left ventricle has normal function. The left ventricle has no regional  wall motion abnormalities. There is mild concentric left ventricular  hypertrophy. Left ventricular diastolic   function could not be evaluated.  2. Right ventricular systolic function is normal. The right ventricular  size is severely enlarged. There is normal pulmonary artery systolic  pressure. The estimated right ventricular systolic pressure is 0000000 mmHg.  3. Right atrial size was severely dilated.  4. The mitral valve has been repaired/replaced. No evidence of mitral  valve regurgitation. There is a 31 mm Medtronic bioprosthetic valve  present in the mitral position. No evidence of mitral valve stenosis. MV  peak gradient, 14.9 mmHg. There is an  increased gradient across the MV bioprosthesis with mean mitral valve  gradient is 8.0 mmHg.  5. The aortic valve is normal in structure. Aortic valve regurgitation is  not visualized. No aortic stenosis is present.  6. The inferior vena cava is normal in size with greater than 50%  respiratory variability, suggesting right atrial pressure of 3 mmHg.  7. There is a small pericardial effusion is posterior to the left  ventricle.   FINDINGS  Left Ventricle: Left ventricular ejection fraction, by estimation, is 60  to 65%. The left ventricle has normal function. The left ventricle has no  regional wall motion abnormalities. The left ventricular internal cavity  size was normal in size. There is  mild concentric left ventricular hypertrophy. Left ventricular diastolic  function could not be evaluated due to mitral valve replacement. Left  ventricular diastolic function could not be evaluated.   Right Ventricle: The right ventricular size is severely enlarged. No  increase in right ventricular wall thickness. Right ventricular systolic  function is normal. There is normal pulmonary artery systolic pressure.  The tricuspid regurgitant velocity is  2.54 m/s, and with an assumed right atrial pressure of 3 mmHg, the  estimated right ventricular systolic pressure is 0000000 mmHg.   Left Atrium: Left atrial size was normal in size.   Right  Atrium: Right atrial size was severely dilated.   Pericardium: A small pericardial effusion is present. The pericardial  effusion is posterior to the left ventricle.   Mitral Valve: The mitral valve has been repaired/replaced. No evidence of  mitral valve regurgitation. There is a 31 mm Medtronic bioprosthetic valve  present in the mitral position. No evidence of mitral valve stenosis. MV  peak gradient, 14.9 mmHg. The  mean mitral valve gradient is 8.0 mmHg.   Tricuspid Valve: The tricuspid valve is normal in structure. Tricuspid  valve regurgitation is mild . No evidence of tricuspid stenosis.   Aortic Valve: The aortic valve is normal in structure. Aortic valve  regurgitation is not visualized. No aortic stenosis is present. Aortic  valve mean gradient measures 10.3 mmHg. Aortic valve peak gradient  measures 19.6 mmHg. Aortic valve area, by VTI  measures 1.17 cm.   Pulmonic Valve: The pulmonic valve was normal in structure. Pulmonic valve  regurgitation is trivial. No evidence of pulmonic stenosis.   Aorta: The aortic root is normal in size and structure.   Venous: The inferior vena cava is normal in size with greater than 50%  respiratory variability, suggesting right atrial pressure of 3 mmHg.   IAS/Shunts: No atrial level shunt detected by color flow Doppler.     LEFT VENTRICLE  PLAX 2D  LVIDd:     3.90 cm  LVIDs:     2.50 cm  LV PW:     1.30 cm  LV IVS:    1.20 cm  LVOT diam:   1.70 cm  LV SV:     40  LV SV Index:  21  LVOT Area:   2.27 cm     RIGHT VENTRICLE       IVC  RV S prime:   11.10 cm/s IVC diam: 1.10 cm  TAPSE (M-mode): 1.6 cm  RVSP:      28.8 mmHg   LEFT ATRIUM       Index    RIGHT ATRIUM      Index  LA diam:    4.30 cm 2.20 cm/m RA Pressure: 3.00 mmHg  LA Vol (A2C):  52.5 ml 26.88 ml/m RA Area:   24.30 cm  LA Vol (A4C):  44.6 ml 22.83 ml/m RA Volume:  95.80 ml 49.05 ml/m  LA  Biplane Vol: 48.8 ml 24.99 ml/m  AORTIC VALVE  AV Area (Vmax):  1.14 cm  AV Area (Vmean):  1.18 cm  AV Area (VTI):   1.17 cm  AV Vmax:      221.33 cm/s  AV Vmean:     146.333 cm/s  AV VTI:      0.347 m  AV Peak Grad:   19.6 mmHg  AV Mean Grad:   10.3 mmHg  LVOT Vmax:     111.00 cm/s  LVOT Vmean:    76.200 cm/s  LVOT VTI:     0.178 m  LVOT/AV VTI ratio: 0.51    AORTA  Ao Root diam: 3.50 cm  Ao Asc diam: 3.50 cm   MITRAL VALVE        TRICUSPID VALVE  MV Area (PHT): 2.99 cm   TR Peak grad:  25.8 mmHg  MV Area VTI:  0.92 cm   TR Vmax:    254.00 cm/s  MV Peak grad: 14.9 mmHg  Estimated RAP: 3.00 mmHg  MV Mean grad: 8.0 mmHg   RVSP:      28.8 mmHg  MV Vmax:    1.93 m/s  MV Vmean:   130.0 cm/s  SHUNTS  MV Decel Time: 254 msec   Systemic VTI: 0.18 m  MV E velocity: 186.00 cm/s Systemic Diam: 1.70 cm  MV A velocity: 175.00 cm/s  MV E/A ratio: 1.06   Fransico Him MD  Electronically signed by Fransico Him MD  Signature Date/Time: 11/30/2020/12:09:07 PM      Final been   Assessment / Plan: The patient is doing well in his postsurgical recovery following mitral valve replaced for MRSA bacterial endocarditis.  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  He appears to be making steady overall recovery from this and remains at this time in a  facility for further rehabilitation.  He continues with his usual dialysis.  Infectious disease has been managing antibiotic portion of his care.  He has multiple comorbidities do limit his progress but he is making them on a steady pace.  He hopes to 1 day be able to use a prosthetic limb for his right lower extremity.  I made no changes to his current medication regimen.  We will see the patient again in 1 month and depending on progress at that time there may not be any further CT surgical  follow-up requirements.      Medication Changes: No orders of the defined types were placed in this encounter.  John Giovanni, PA-C 12/13/2020 1:48 PM

## 2020-12-15 DIAGNOSIS — Z5181 Encounter for therapeutic drug level monitoring: Secondary | ICD-10-CM | POA: Diagnosis not present

## 2020-12-15 DIAGNOSIS — E875 Hyperkalemia: Secondary | ICD-10-CM | POA: Diagnosis not present

## 2020-12-15 DIAGNOSIS — D689 Coagulation defect, unspecified: Secondary | ICD-10-CM | POA: Diagnosis not present

## 2020-12-15 DIAGNOSIS — Z992 Dependence on renal dialysis: Secondary | ICD-10-CM | POA: Diagnosis not present

## 2020-12-15 DIAGNOSIS — N2581 Secondary hyperparathyroidism of renal origin: Secondary | ICD-10-CM | POA: Diagnosis not present

## 2020-12-15 DIAGNOSIS — D631 Anemia in chronic kidney disease: Secondary | ICD-10-CM | POA: Diagnosis not present

## 2020-12-15 DIAGNOSIS — N186 End stage renal disease: Secondary | ICD-10-CM | POA: Diagnosis not present

## 2020-12-17 DIAGNOSIS — D689 Coagulation defect, unspecified: Secondary | ICD-10-CM | POA: Diagnosis not present

## 2020-12-17 DIAGNOSIS — Z5181 Encounter for therapeutic drug level monitoring: Secondary | ICD-10-CM | POA: Diagnosis not present

## 2020-12-17 DIAGNOSIS — N186 End stage renal disease: Secondary | ICD-10-CM | POA: Diagnosis not present

## 2020-12-17 DIAGNOSIS — E875 Hyperkalemia: Secondary | ICD-10-CM | POA: Diagnosis not present

## 2020-12-17 DIAGNOSIS — Z992 Dependence on renal dialysis: Secondary | ICD-10-CM | POA: Diagnosis not present

## 2020-12-17 DIAGNOSIS — N2581 Secondary hyperparathyroidism of renal origin: Secondary | ICD-10-CM | POA: Diagnosis not present

## 2020-12-17 DIAGNOSIS — D631 Anemia in chronic kidney disease: Secondary | ICD-10-CM | POA: Diagnosis not present

## 2020-12-21 ENCOUNTER — Other Ambulatory Visit: Payer: Self-pay | Admitting: Thoracic Surgery (Cardiothoracic Vascular Surgery)

## 2020-12-21 DIAGNOSIS — Z20828 Contact with and (suspected) exposure to other viral communicable diseases: Secondary | ICD-10-CM | POA: Diagnosis not present

## 2020-12-21 DIAGNOSIS — R911 Solitary pulmonary nodule: Secondary | ICD-10-CM

## 2020-12-22 ENCOUNTER — Emergency Department (HOSPITAL_COMMUNITY): Payer: Medicaid Other

## 2020-12-22 ENCOUNTER — Encounter (HOSPITAL_COMMUNITY): Payer: Self-pay | Admitting: *Deleted

## 2020-12-22 ENCOUNTER — Inpatient Hospital Stay (HOSPITAL_COMMUNITY)
Admission: EM | Admit: 2020-12-22 | Discharge: 2021-01-06 | DRG: 871 | Disposition: A | Discharge status: Skilled Nursing Facility | Payer: Medicaid Other | Attending: Internal Medicine | Admitting: Internal Medicine

## 2020-12-22 ENCOUNTER — Other Ambulatory Visit: Payer: Self-pay

## 2020-12-22 DIAGNOSIS — J9601 Acute respiratory failure with hypoxia: Secondary | ICD-10-CM | POA: Diagnosis not present

## 2020-12-22 DIAGNOSIS — R6521 Severe sepsis with septic shock: Secondary | ICD-10-CM | POA: Diagnosis not present

## 2020-12-22 DIAGNOSIS — I340 Nonrheumatic mitral (valve) insufficiency: Secondary | ICD-10-CM | POA: Diagnosis not present

## 2020-12-22 DIAGNOSIS — M5137 Other intervertebral disc degeneration, lumbosacral region: Secondary | ICD-10-CM | POA: Diagnosis present

## 2020-12-22 DIAGNOSIS — I10 Essential (primary) hypertension: Secondary | ICD-10-CM | POA: Diagnosis not present

## 2020-12-22 DIAGNOSIS — F1120 Opioid dependence, uncomplicated: Secondary | ICD-10-CM | POA: Diagnosis present

## 2020-12-22 DIAGNOSIS — I248 Other forms of acute ischemic heart disease: Secondary | ICD-10-CM | POA: Diagnosis present

## 2020-12-22 DIAGNOSIS — Z8673 Personal history of transient ischemic attack (TIA), and cerebral infarction without residual deficits: Secondary | ICD-10-CM

## 2020-12-22 DIAGNOSIS — D631 Anemia in chronic kidney disease: Secondary | ICD-10-CM | POA: Diagnosis not present

## 2020-12-22 DIAGNOSIS — G4733 Obstructive sleep apnea (adult) (pediatric): Secondary | ICD-10-CM | POA: Diagnosis present

## 2020-12-22 DIAGNOSIS — I4892 Unspecified atrial flutter: Secondary | ICD-10-CM | POA: Diagnosis present

## 2020-12-22 DIAGNOSIS — Z952 Presence of prosthetic heart valve: Secondary | ICD-10-CM

## 2020-12-22 DIAGNOSIS — Z87891 Personal history of nicotine dependence: Secondary | ICD-10-CM

## 2020-12-22 DIAGNOSIS — J984 Other disorders of lung: Secondary | ICD-10-CM | POA: Diagnosis present

## 2020-12-22 DIAGNOSIS — E785 Hyperlipidemia, unspecified: Secondary | ICD-10-CM | POA: Diagnosis present

## 2020-12-22 DIAGNOSIS — I081 Rheumatic disorders of both mitral and tricuspid valves: Secondary | ICD-10-CM | POA: Diagnosis present

## 2020-12-22 DIAGNOSIS — M4804 Spinal stenosis, thoracic region: Secondary | ICD-10-CM | POA: Diagnosis present

## 2020-12-22 DIAGNOSIS — R000 Tachycardia, unspecified: Secondary | ICD-10-CM | POA: Diagnosis not present

## 2020-12-22 DIAGNOSIS — K219 Gastro-esophageal reflux disease without esophagitis: Secondary | ICD-10-CM | POA: Diagnosis present

## 2020-12-22 DIAGNOSIS — B1920 Unspecified viral hepatitis C without hepatic coma: Secondary | ICD-10-CM | POA: Diagnosis present

## 2020-12-22 DIAGNOSIS — A419 Sepsis, unspecified organism: Secondary | ICD-10-CM

## 2020-12-22 DIAGNOSIS — Z992 Dependence on renal dialysis: Secondary | ICD-10-CM | POA: Diagnosis not present

## 2020-12-22 DIAGNOSIS — I132 Hypertensive heart and chronic kidney disease with heart failure and with stage 5 chronic kidney disease, or end stage renal disease: Secondary | ICD-10-CM | POA: Diagnosis present

## 2020-12-22 DIAGNOSIS — M48061 Spinal stenosis, lumbar region without neurogenic claudication: Secondary | ICD-10-CM | POA: Diagnosis present

## 2020-12-22 DIAGNOSIS — Z7189 Other specified counseling: Secondary | ICD-10-CM | POA: Diagnosis not present

## 2020-12-22 DIAGNOSIS — I6340 Cerebral infarction due to embolism of unspecified cerebral artery: Secondary | ICD-10-CM | POA: Diagnosis not present

## 2020-12-22 DIAGNOSIS — I517 Cardiomegaly: Secondary | ICD-10-CM | POA: Diagnosis not present

## 2020-12-22 DIAGNOSIS — L0291 Cutaneous abscess, unspecified: Secondary | ICD-10-CM

## 2020-12-22 DIAGNOSIS — J9811 Atelectasis: Secondary | ICD-10-CM | POA: Diagnosis not present

## 2020-12-22 DIAGNOSIS — N189 Chronic kidney disease, unspecified: Secondary | ICD-10-CM | POA: Diagnosis present

## 2020-12-22 DIAGNOSIS — E876 Hypokalemia: Secondary | ICD-10-CM | POA: Diagnosis not present

## 2020-12-22 DIAGNOSIS — Y831 Surgical operation with implant of artificial internal device as the cause of abnormal reaction of the patient, or of later complication, without mention of misadventure at the time of the procedure: Secondary | ICD-10-CM | POA: Diagnosis present

## 2020-12-22 DIAGNOSIS — G9341 Metabolic encephalopathy: Secondary | ICD-10-CM | POA: Diagnosis present

## 2020-12-22 DIAGNOSIS — Z86711 Personal history of pulmonary embolism: Secondary | ICD-10-CM

## 2020-12-22 DIAGNOSIS — Q612 Polycystic kidney, adult type: Secondary | ICD-10-CM | POA: Diagnosis not present

## 2020-12-22 DIAGNOSIS — Z7982 Long term (current) use of aspirin: Secondary | ICD-10-CM

## 2020-12-22 DIAGNOSIS — E871 Hypo-osmolality and hyponatremia: Secondary | ICD-10-CM | POA: Diagnosis present

## 2020-12-22 DIAGNOSIS — H53129 Transient visual loss, unspecified eye: Secondary | ICD-10-CM | POA: Diagnosis present

## 2020-12-22 DIAGNOSIS — D72829 Elevated white blood cell count, unspecified: Secondary | ICD-10-CM | POA: Diagnosis not present

## 2020-12-22 DIAGNOSIS — B182 Chronic viral hepatitis C: Secondary | ICD-10-CM | POA: Diagnosis present

## 2020-12-22 DIAGNOSIS — I330 Acute and subacute infective endocarditis: Secondary | ICD-10-CM | POA: Diagnosis present

## 2020-12-22 DIAGNOSIS — T826XXA Infection and inflammatory reaction due to cardiac valve prosthesis, initial encounter: Secondary | ICD-10-CM | POA: Diagnosis not present

## 2020-12-22 DIAGNOSIS — K7460 Unspecified cirrhosis of liver: Secondary | ICD-10-CM | POA: Diagnosis present

## 2020-12-22 DIAGNOSIS — R0689 Other abnormalities of breathing: Secondary | ICD-10-CM | POA: Diagnosis not present

## 2020-12-22 DIAGNOSIS — N2581 Secondary hyperparathyroidism of renal origin: Secondary | ICD-10-CM | POA: Diagnosis present

## 2020-12-22 DIAGNOSIS — I471 Supraventricular tachycardia: Secondary | ICD-10-CM | POA: Diagnosis not present

## 2020-12-22 DIAGNOSIS — I499 Cardiac arrhythmia, unspecified: Secondary | ICD-10-CM | POA: Diagnosis not present

## 2020-12-22 DIAGNOSIS — Z515 Encounter for palliative care: Secondary | ICD-10-CM

## 2020-12-22 DIAGNOSIS — R6510 Systemic inflammatory response syndrome (SIRS) of non-infectious origin without acute organ dysfunction: Secondary | ICD-10-CM

## 2020-12-22 DIAGNOSIS — Z8774 Personal history of (corrected) congenital malformations of heart and circulatory system: Secondary | ICD-10-CM

## 2020-12-22 DIAGNOSIS — F1010 Alcohol abuse, uncomplicated: Secondary | ICD-10-CM | POA: Diagnosis present

## 2020-12-22 DIAGNOSIS — Z20822 Contact with and (suspected) exposure to covid-19: Secondary | ICD-10-CM | POA: Diagnosis not present

## 2020-12-22 DIAGNOSIS — I5042 Chronic combined systolic (congestive) and diastolic (congestive) heart failure: Secondary | ICD-10-CM | POA: Diagnosis present

## 2020-12-22 DIAGNOSIS — R531 Weakness: Secondary | ICD-10-CM | POA: Diagnosis not present

## 2020-12-22 DIAGNOSIS — Q211 Atrial septal defect: Secondary | ICD-10-CM

## 2020-12-22 DIAGNOSIS — I76 Septic arterial embolism: Secondary | ICD-10-CM | POA: Diagnosis present

## 2020-12-22 DIAGNOSIS — M545 Low back pain, unspecified: Secondary | ICD-10-CM | POA: Diagnosis not present

## 2020-12-22 DIAGNOSIS — R278 Other lack of coordination: Secondary | ICD-10-CM | POA: Diagnosis present

## 2020-12-22 DIAGNOSIS — I440 Atrioventricular block, first degree: Secondary | ICD-10-CM | POA: Diagnosis present

## 2020-12-22 DIAGNOSIS — R7881 Bacteremia: Secondary | ICD-10-CM | POA: Diagnosis not present

## 2020-12-22 DIAGNOSIS — A4102 Sepsis due to Methicillin resistant Staphylococcus aureus: Principal | ICD-10-CM | POA: Diagnosis present

## 2020-12-22 DIAGNOSIS — Z8614 Personal history of Methicillin resistant Staphylococcus aureus infection: Secondary | ICD-10-CM

## 2020-12-22 DIAGNOSIS — Z803 Family history of malignant neoplasm of breast: Secondary | ICD-10-CM

## 2020-12-22 DIAGNOSIS — N186 End stage renal disease: Secondary | ICD-10-CM | POA: Diagnosis present

## 2020-12-22 DIAGNOSIS — B9561 Methicillin susceptible Staphylococcus aureus infection as the cause of diseases classified elsewhere: Secondary | ICD-10-CM

## 2020-12-22 DIAGNOSIS — R6520 Severe sepsis without septic shock: Principal | ICD-10-CM

## 2020-12-22 DIAGNOSIS — B9562 Methicillin resistant Staphylococcus aureus infection as the cause of diseases classified elsewhere: Secondary | ICD-10-CM | POA: Diagnosis not present

## 2020-12-22 DIAGNOSIS — I618 Other nontraumatic intracerebral hemorrhage: Secondary | ICD-10-CM | POA: Diagnosis not present

## 2020-12-22 DIAGNOSIS — I351 Nonrheumatic aortic (valve) insufficiency: Secondary | ICD-10-CM | POA: Diagnosis not present

## 2020-12-22 DIAGNOSIS — R4182 Altered mental status, unspecified: Secondary | ICD-10-CM | POA: Diagnosis not present

## 2020-12-22 DIAGNOSIS — Z89611 Acquired absence of right leg above knee: Secondary | ICD-10-CM | POA: Diagnosis not present

## 2020-12-22 DIAGNOSIS — R509 Fever, unspecified: Secondary | ICD-10-CM | POA: Diagnosis not present

## 2020-12-22 DIAGNOSIS — Z79899 Other long term (current) drug therapy: Secondary | ICD-10-CM

## 2020-12-22 DIAGNOSIS — E43 Unspecified severe protein-calorie malnutrition: Secondary | ICD-10-CM | POA: Diagnosis not present

## 2020-12-22 DIAGNOSIS — I639 Cerebral infarction, unspecified: Secondary | ICD-10-CM | POA: Diagnosis not present

## 2020-12-22 DIAGNOSIS — G894 Chronic pain syndrome: Secondary | ICD-10-CM | POA: Diagnosis present

## 2020-12-22 DIAGNOSIS — I5032 Chronic diastolic (congestive) heart failure: Secondary | ICD-10-CM | POA: Diagnosis not present

## 2020-12-22 DIAGNOSIS — I38 Endocarditis, valve unspecified: Secondary | ICD-10-CM | POA: Diagnosis not present

## 2020-12-22 DIAGNOSIS — Z9114 Patient's other noncompliance with medication regimen: Secondary | ICD-10-CM

## 2020-12-22 DIAGNOSIS — Z8249 Family history of ischemic heart disease and other diseases of the circulatory system: Secondary | ICD-10-CM

## 2020-12-22 DIAGNOSIS — R410 Disorientation, unspecified: Secondary | ICD-10-CM | POA: Diagnosis not present

## 2020-12-22 DIAGNOSIS — R0902 Hypoxemia: Secondary | ICD-10-CM | POA: Diagnosis not present

## 2020-12-22 DIAGNOSIS — Z79891 Long term (current) use of opiate analgesic: Secondary | ICD-10-CM

## 2020-12-22 DIAGNOSIS — J90 Pleural effusion, not elsewhere classified: Secondary | ICD-10-CM | POA: Diagnosis not present

## 2020-12-22 LAB — CBC WITH DIFFERENTIAL/PLATELET
Abs Immature Granulocytes: 0.23 10*3/uL — ABNORMAL HIGH (ref 0.00–0.07)
Basophils Absolute: 0 10*3/uL (ref 0.0–0.1)
Basophils Relative: 0 %
Eosinophils Absolute: 0 10*3/uL (ref 0.0–0.5)
Eosinophils Relative: 0 %
HCT: 32 % — ABNORMAL LOW (ref 39.0–52.0)
Hemoglobin: 9.4 g/dL — ABNORMAL LOW (ref 13.0–17.0)
Immature Granulocytes: 1 %
Lymphocytes Relative: 3 %
Lymphs Abs: 0.5 10*3/uL — ABNORMAL LOW (ref 0.7–4.0)
MCH: 24.2 pg — ABNORMAL LOW (ref 26.0–34.0)
MCHC: 29.4 g/dL — ABNORMAL LOW (ref 30.0–36.0)
MCV: 82.5 fL (ref 80.0–100.0)
Monocytes Absolute: 1.4 10*3/uL — ABNORMAL HIGH (ref 0.1–1.0)
Monocytes Relative: 7 %
Neutro Abs: 18 10*3/uL — ABNORMAL HIGH (ref 1.7–7.7)
Neutrophils Relative %: 89 %
Platelets: 215 10*3/uL (ref 150–400)
RBC: 3.88 MIL/uL — ABNORMAL LOW (ref 4.22–5.81)
RDW: 19.9 % — ABNORMAL HIGH (ref 11.5–15.5)
WBC: 20.2 10*3/uL — ABNORMAL HIGH (ref 4.0–10.5)
nRBC: 0 % (ref 0.0–0.2)

## 2020-12-22 LAB — BLOOD CULTURE ID PANEL (REFLEXED) - BCID2

## 2020-12-22 LAB — COMPREHENSIVE METABOLIC PANEL
ALT: 10 U/L (ref 0–44)
AST: 20 U/L (ref 15–41)
Albumin: 2.4 g/dL — ABNORMAL LOW (ref 3.5–5.0)
Alkaline Phosphatase: 74 U/L (ref 38–126)
Anion gap: 16 — ABNORMAL HIGH (ref 5–15)
BUN: 56 mg/dL — ABNORMAL HIGH (ref 6–20)
CO2: 25 mmol/L (ref 22–32)
Calcium: 8.8 mg/dL — ABNORMAL LOW (ref 8.9–10.3)
Chloride: 92 mmol/L — ABNORMAL LOW (ref 98–111)
Creatinine, Ser: 8.2 mg/dL — ABNORMAL HIGH (ref 0.61–1.24)
GFR, Estimated: 7 mL/min — ABNORMAL LOW (ref >60)
Glucose, Bld: 82 mg/dL (ref 70–99)
Potassium: 4.4 mmol/L (ref 3.5–5.1)
Sodium: 133 mmol/L — ABNORMAL LOW (ref 135–145)
Total Bilirubin: 1 mg/dL (ref 0.3–1.2)
Total Protein: 8.4 g/dL — ABNORMAL HIGH (ref 6.5–8.1)

## 2020-12-22 LAB — CBG MONITORING, ED: Glucose-Capillary: 83 mg/dL (ref 70–99)

## 2020-12-22 LAB — RESP PANEL BY RT-PCR (FLU A&B, COVID) ARPGX2
Influenza A by PCR: NEGATIVE
Influenza B by PCR: NEGATIVE
SARS Coronavirus 2 by RT PCR: NEGATIVE

## 2020-12-22 LAB — APTT: aPTT: 40 seconds — ABNORMAL HIGH (ref 24–36)

## 2020-12-22 LAB — PROTIME-INR
INR: 1.3 — ABNORMAL HIGH (ref 0.8–1.2)
Prothrombin Time: 15.9 seconds — ABNORMAL HIGH (ref 11.4–15.2)

## 2020-12-22 LAB — LACTIC ACID, PLASMA: Lactic Acid, Venous: 1.7 mmol/L (ref 0.5–1.9)

## 2020-12-22 IMAGING — DX DG CHEST 1V PORT
1 series · 1 of 1 positions shown · non-contrast
Comparison: Chest radiographs [DATE] and earlier.

CLINICAL DATA: 49-year-old male with possible sepsis. Dialysis
patient. Weakness.

EXAM:
PORTABLE CHEST 1 VIEW

[chest]
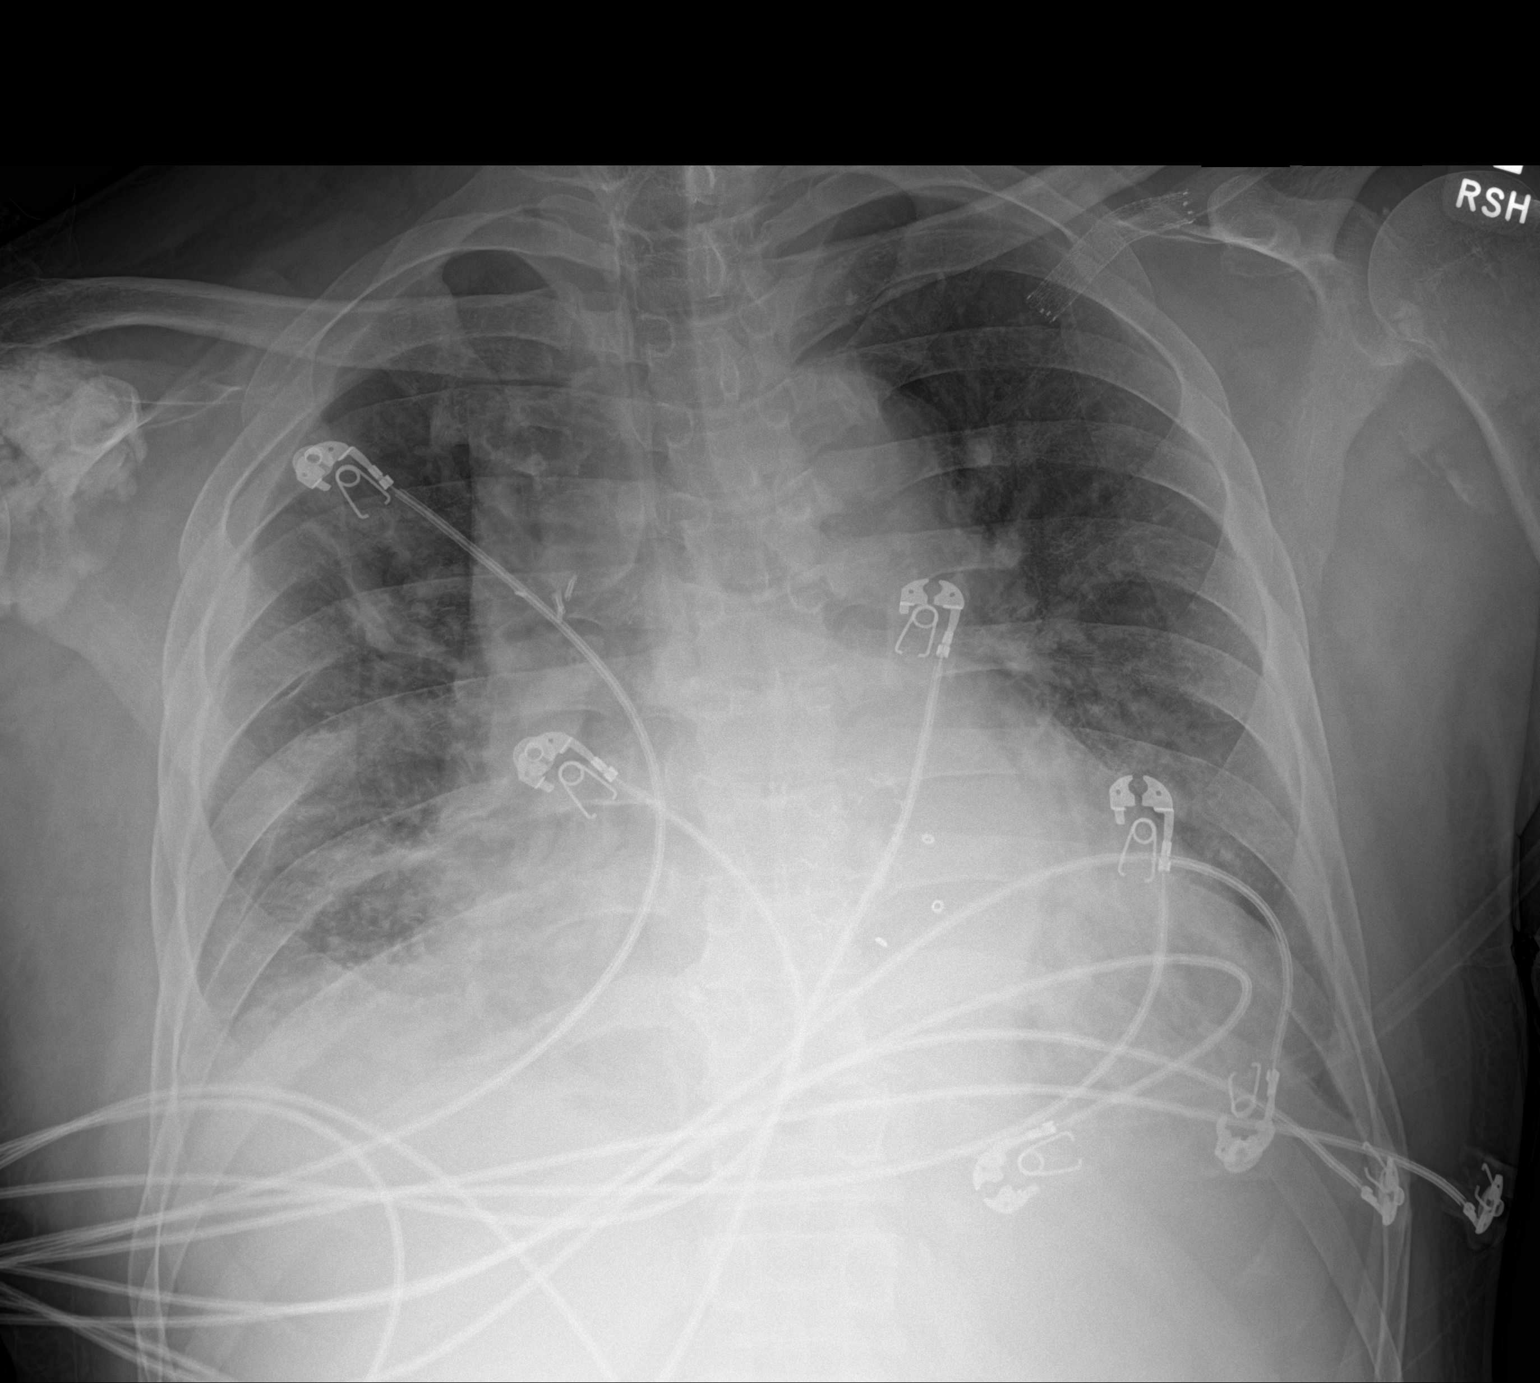

[1 of 1 positions shown; findings below may reference images not displayed]

FINDINGS: Portable AP semi upright view at [CR] hours. Lower lung volumes.
Stable cardiomegaly and mediastinal contours. Visualized tracheal
air column is within normal limits. No pneumothorax. Chronic
architectural distortion in the right lung with curvilinear opacity
is stable. Mild hypo ventilation and atelectasis now at both lung
bases. No consolidation. No pleural effusion is evident.

Stable visualized osseous structures. Abundant chronic dystrophic
calcification about the visible right shoulder. Chronic left
subclavian vascular stent. Paucity of bowel gas in the upper
abdomen.
IMPRESSION: 1. Lower lung volumes with atelectasis.
2. Chronic cardiomegaly and right lung scarring.

## 2020-12-22 IMAGING — MR MR LUMBAR SPINE WO/W CM
4 of 7 series · 18 of 48 positions shown · IV contrast (20    MULTI)
Comparison: Lumbar MRI [DATE]

CLINICAL DATA: Low back pain, possible infection

EXAM:
MRI LUMBAR SPINE WITHOUT AND WITH CONTRAST
TECHNIQUE: Multiplanar and multiecho pulse sequences of the lumbar spine were
obtained without and with intravenous contrast.
CONTRAST:  7.5mL GADAVIST GADOBUTROL 1 MMOL/ML IV SOLN

[Series 3: T2 · sagittal · 4.0mm · 0.55mm/px · 5 of 16 slices shown (1 of 2)]
[im 1/16]
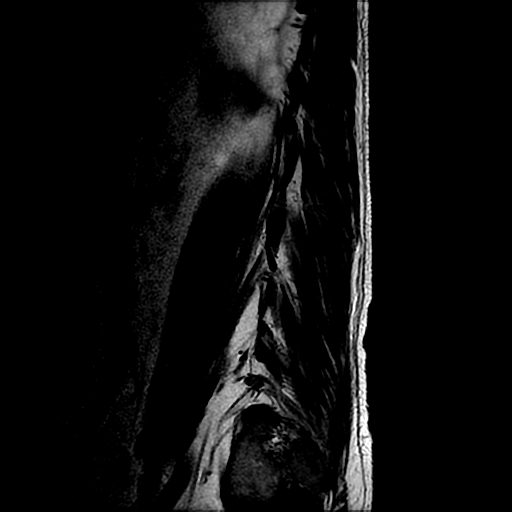
[im 4/16]
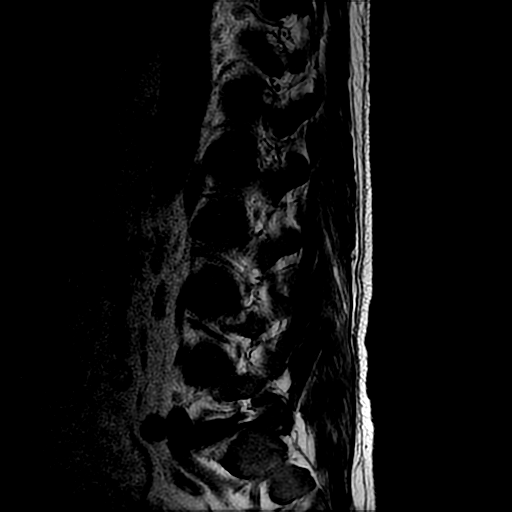
[im 8/16]
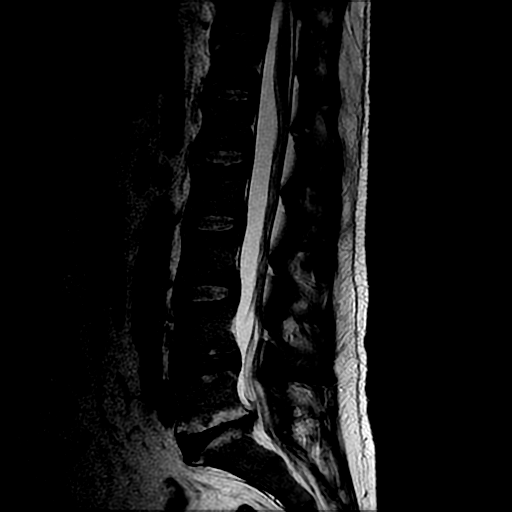
[im 12/16]
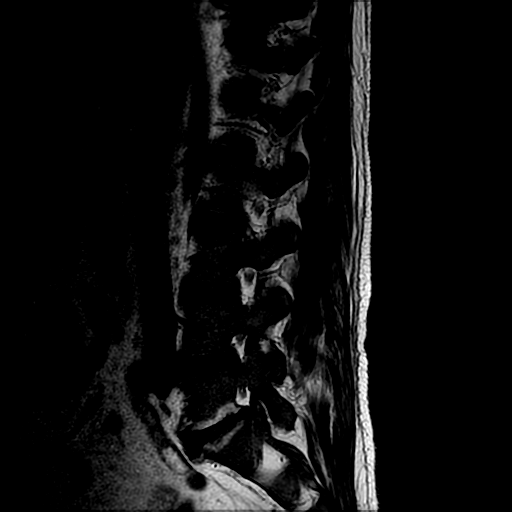
[im 16/16]
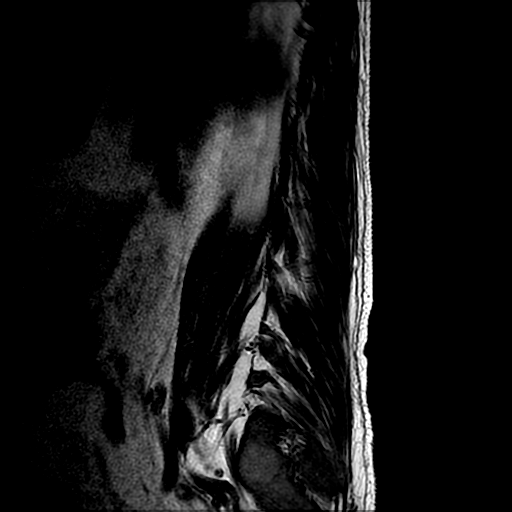

[Series 5: T1 · sagittal · 4.0mm · 0.55mm/px · 3 of 16 slices shown (1 of 2)]
[im 1/16]
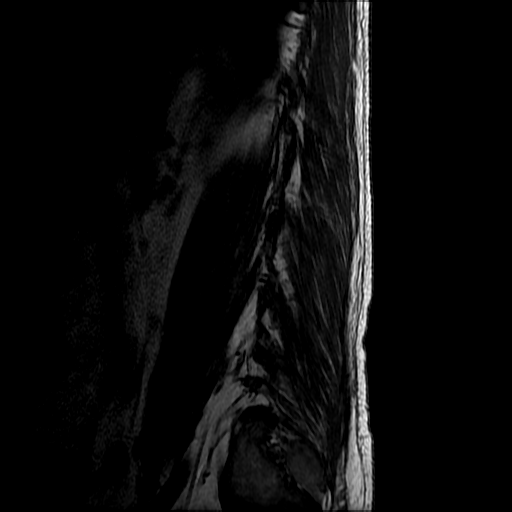
[im 11/16]
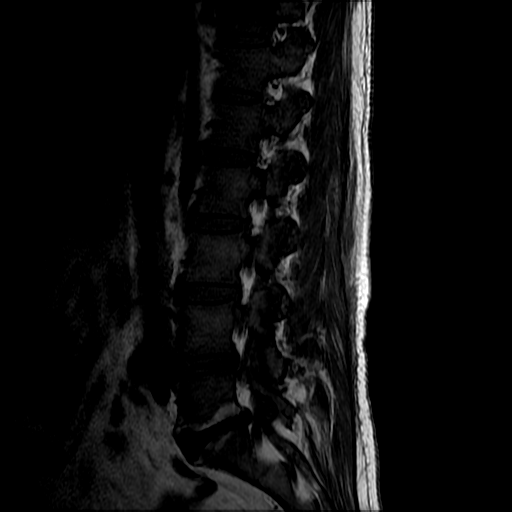
[im 16/16]
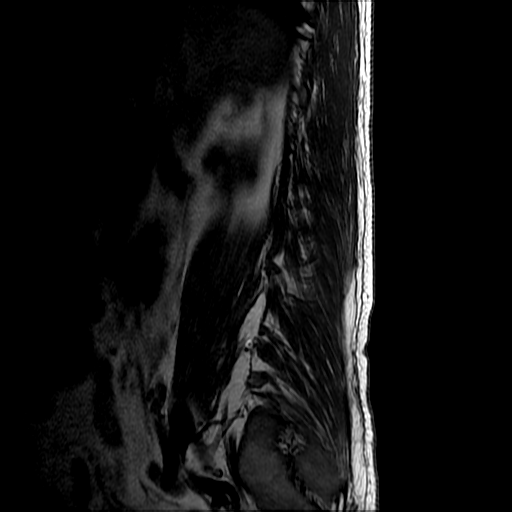

[Series 6: T2 · axial · 4.0mm · 0.39mm/px · z∈[-117,+67]mm · 7 of 39 slices shown (2 of 2)]
[im 1/39]
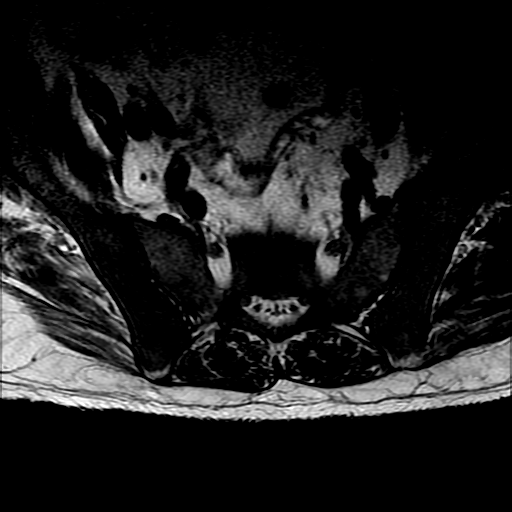
[im 5/39]
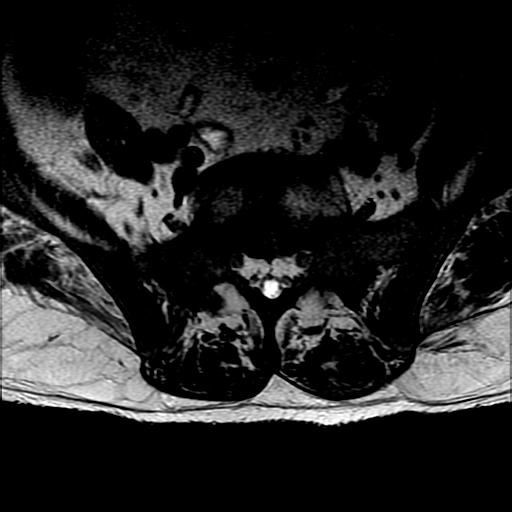
[im 13/39]
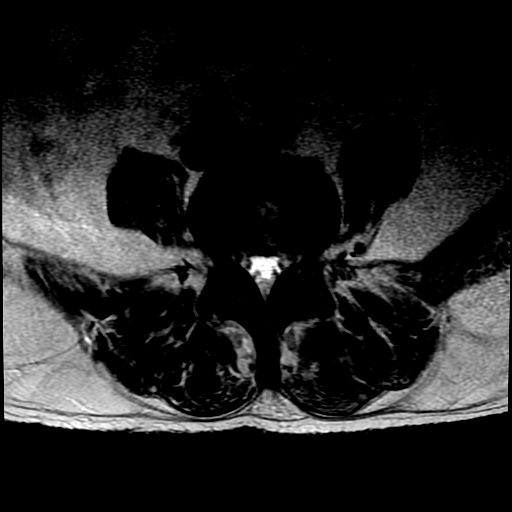
[im 17/39]
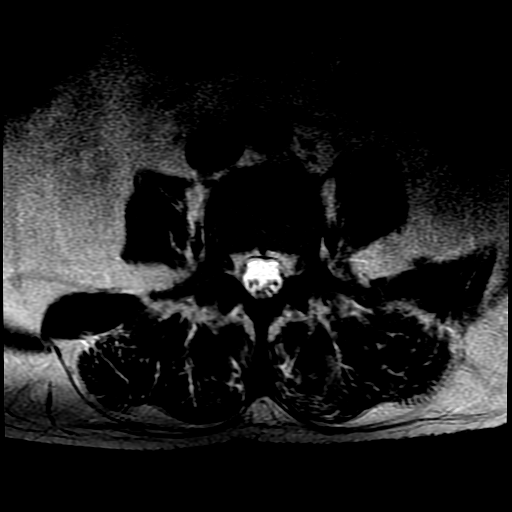
[im 22/39]
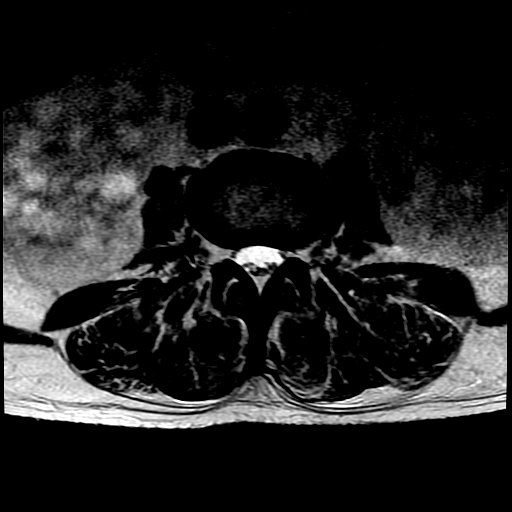
[im 26/39]
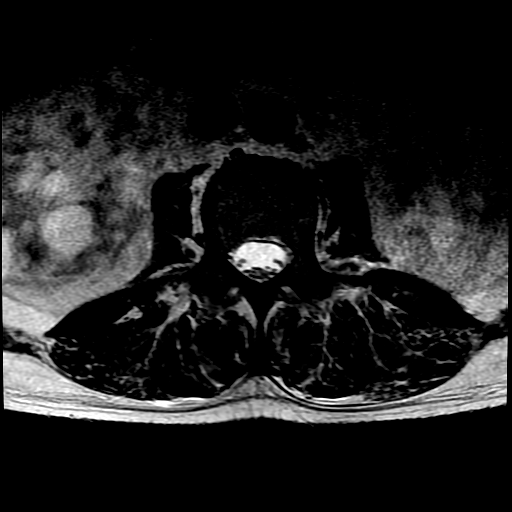
[im 34/39]
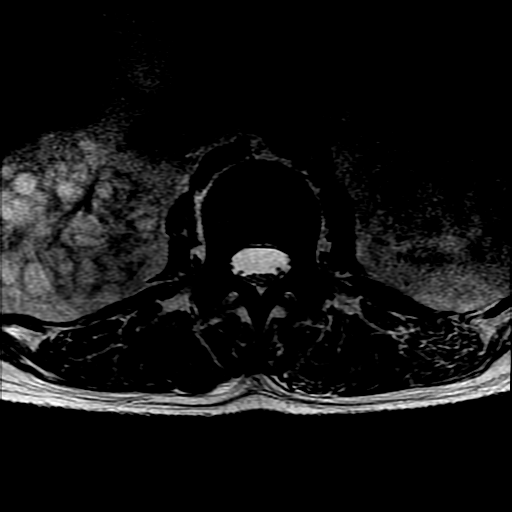

[Series 7: T1 · axial · 4.0mm · 0.39mm/px · z∈[-98,+67]mm · 3 of 39 slices shown (2 of 2)]
[im 5/39]
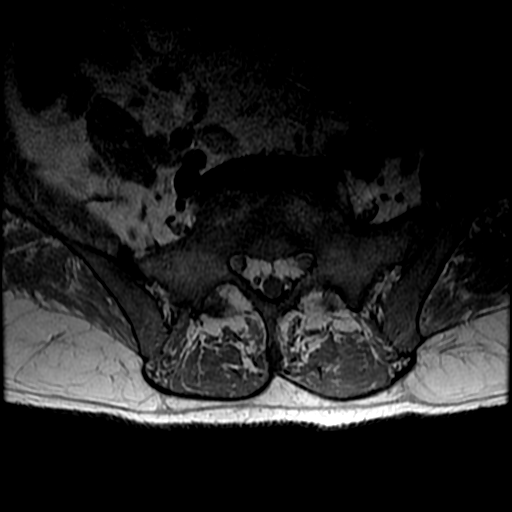
[im 22/39]
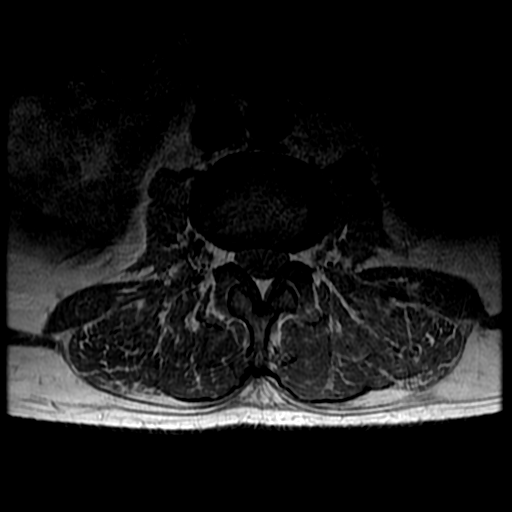
[im 34/39]
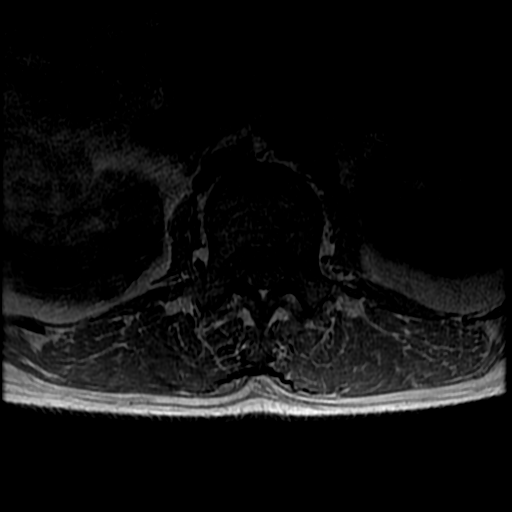

[18 of 48 positions shown; findings below may reference images not displayed]

FINDINGS: Despite efforts by the technologist and patient, motion artifact is
present on today's exam and could not be eliminated. This reduces
exam sensitivity and specificity.

Segmentation: The lowest lumbar type non-rib-bearing vertebra is
labeled as L5.

Alignment:  Unremarkable

Vertebrae: Type 2 degenerative endplate findings at L5-S1 with
endplate irregularities, disc desiccation, and loss of disc height.
This is mostly similar to the [DATE] exam. There is also disc
desiccation at L4-5 similar to prior. No significant abnormal degree
of bony enhancement.

Conus medullaris and cauda equina: Conus extends to the L1 level.
Conus and cauda equina appear normal.

Paraspinal and other soft tissues: Innumerable cysts of varying size
and complexity scattered in both kidneys compatible with polycystic
kidney disease. The kidneys are indistinct due to motion in today's
exam was not optimized to assess the kidneys. Small retroperitoneal
lymph nodes are not pathologically enlarged by size criteria.

Disc levels:

L1-2: Unremarkable.

L2-3: Unremarkable.

L3-4: Unremarkable.

L4-5: Mild bilateral subarticular lateral recess stenosis due to
disc bulge, minimally worsened from prior.

L5-S1: Mild bilateral subarticular lateral recess stenosis and
borderline bilateral foraminal stenosis due to intervertebral
spurring, disc bulge, and facet arthropathy, similar to prior.
IMPRESSION: 1. No findings of discitis or osteomyelitis. No compelling findings
of epidural or psoas abscess.
2. Lumbar spondylosis and degenerative disc disease with mild
impingement at L4-5 and L5-S1. Degenerative endplate findings
particularly at L5-S1.
3. Autosomal dominant polycystic kidney disease.

## 2020-12-22 IMAGING — CT CT HEAD W/O CM
4 series · 15 of 47 positions shown, 17 images · non-contrast
Comparison: [DATE].

CLINICAL DATA: Mental status changes of unknown cause.  Confusion.

EXAM:
CT HEAD WITHOUT CONTRAST
TECHNIQUE: Contiguous axial images were obtained from the base of the skull
through the vertex without intravenous contrast.

[Series 3: head bone · axial · 0.41mm/px · z∈[-115,-99]mm · 2 of 77 slices shown]
[im 8/77  bone]
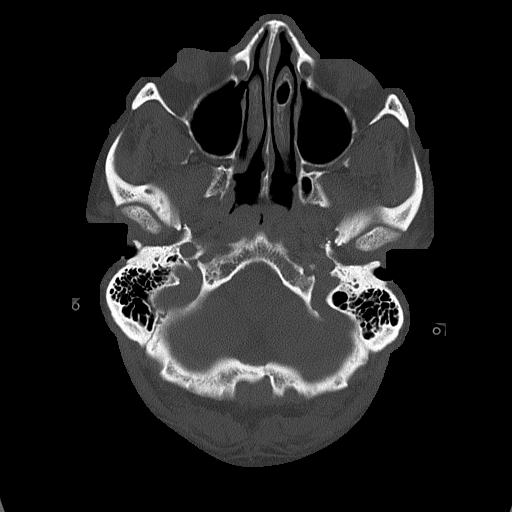
[im 16/77  bone]
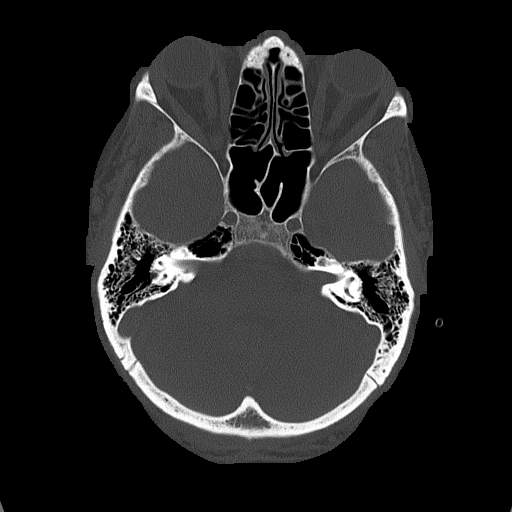

[Series 4: head without · axial · non-contrast · 0.41mm/px · z∈[-114,+1]mm · 7 of 31 slices shown, 9 images]
[im 4/31  brain]
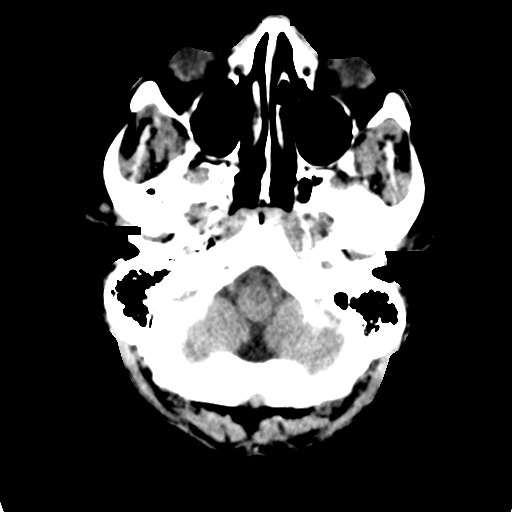
[im 4/31  bone]
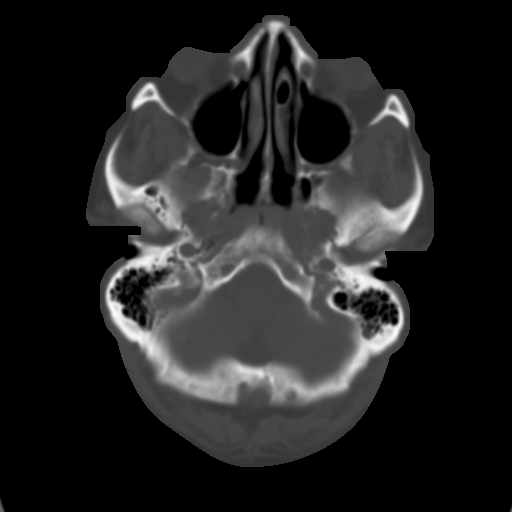
[im 8/31  brain]
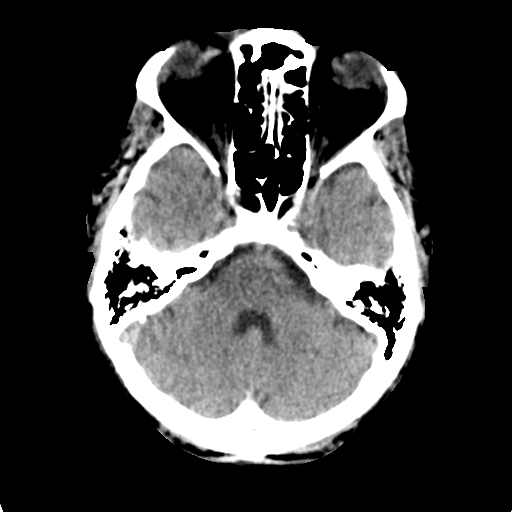
[im 12/31  brain]
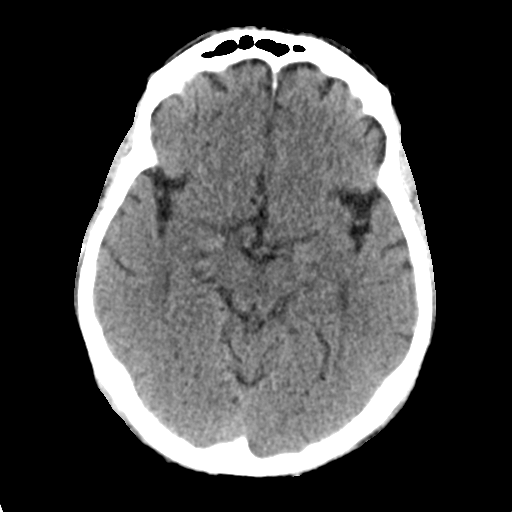
[im 16/31  brain]
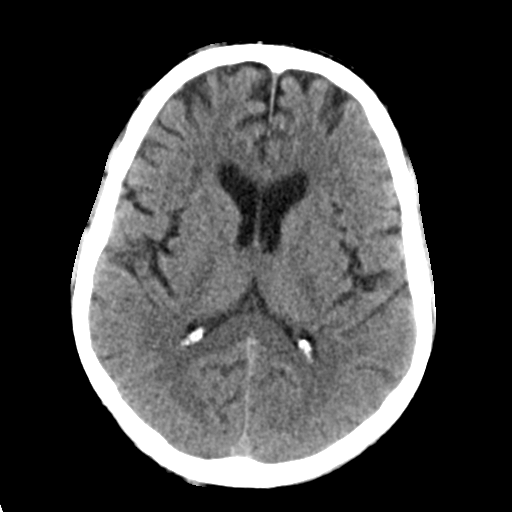
[im 19/31  brain]
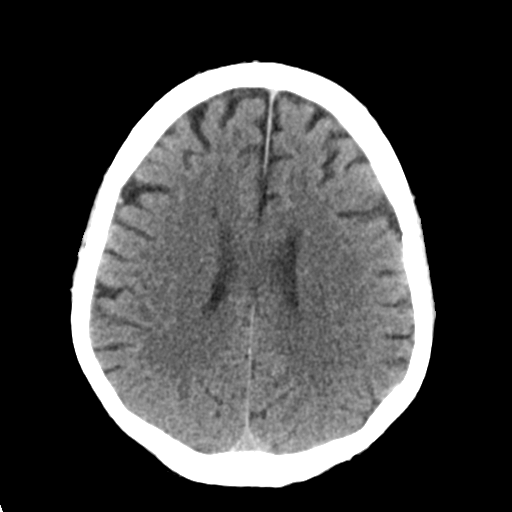
[im 19/31  bone]
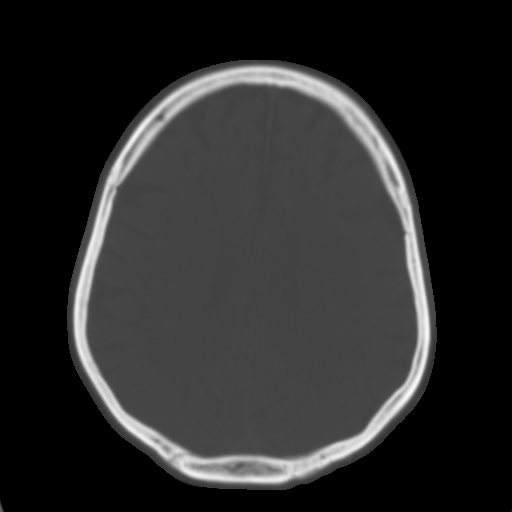
[im 23/31  brain]
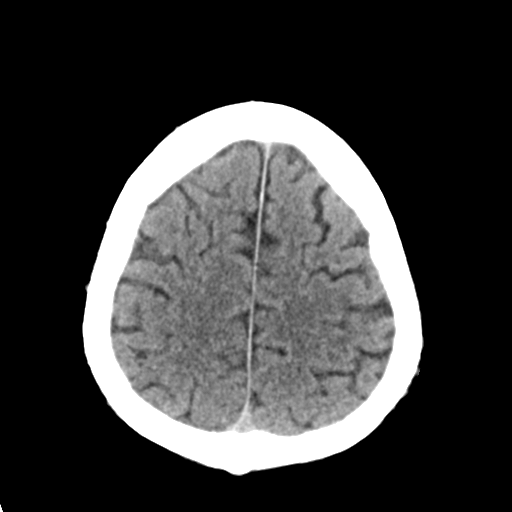
[im 27/31  brain]
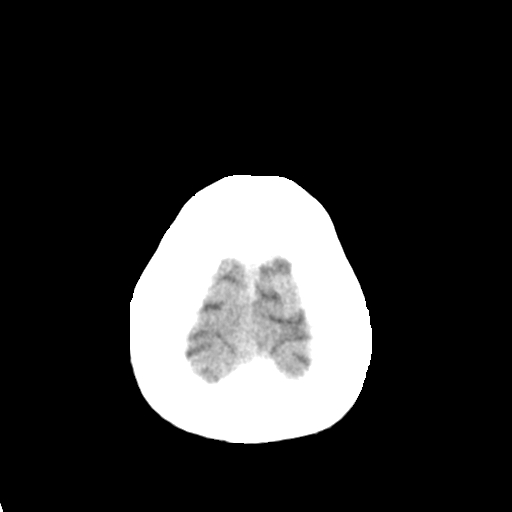

[Series 5: head without cor · coronal · non-contrast · 0.30mm/px · 3 of 66 slices shown]
[im 22/66  brain]
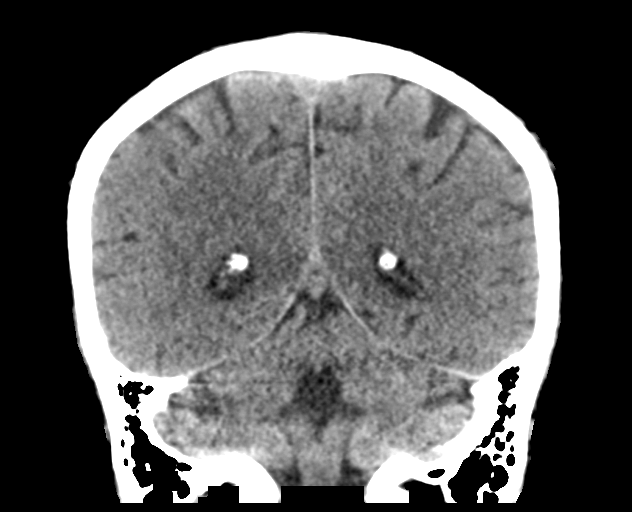
[im 29/66  brain]
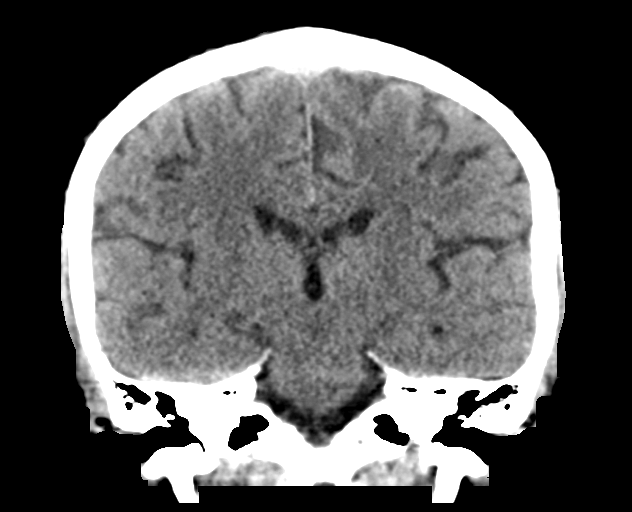
[im 37/66  brain]
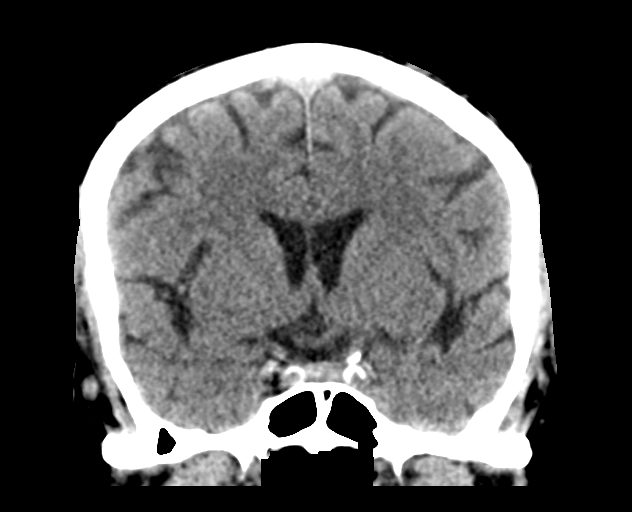

[Series 6: head without sag · sagittal · non-contrast · 0.29mm/px · 3 of 56 slices shown]
[im 19/56  brain]
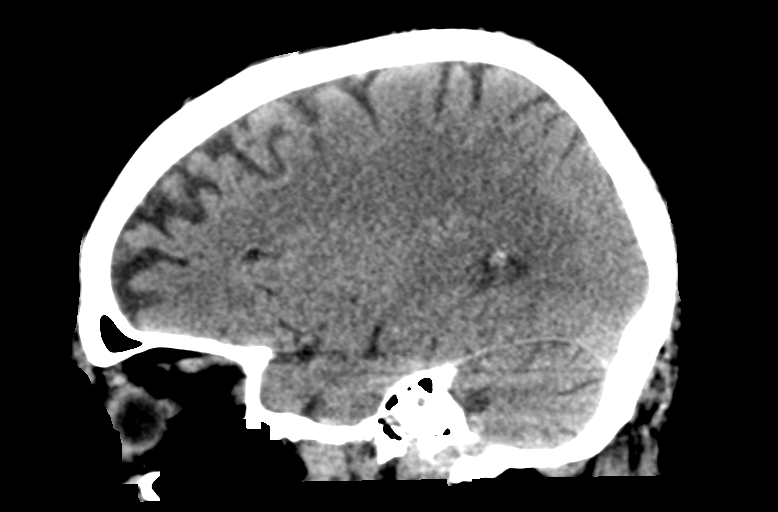
[im 28/56  brain]
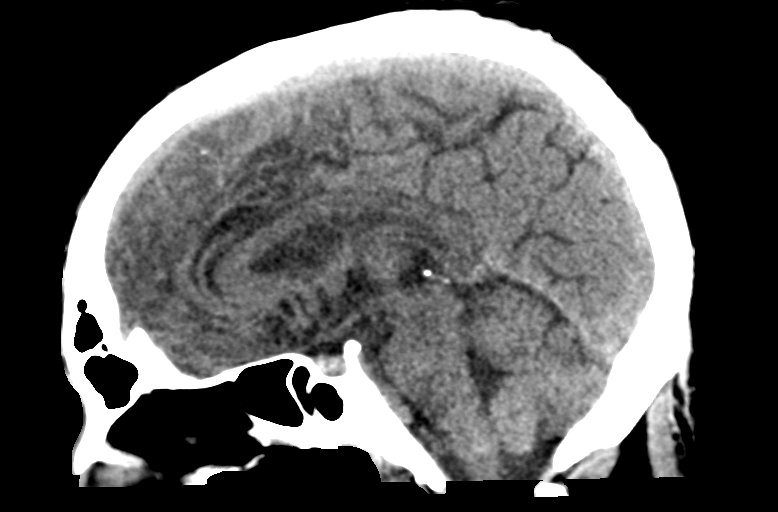
[im 37/56  brain]
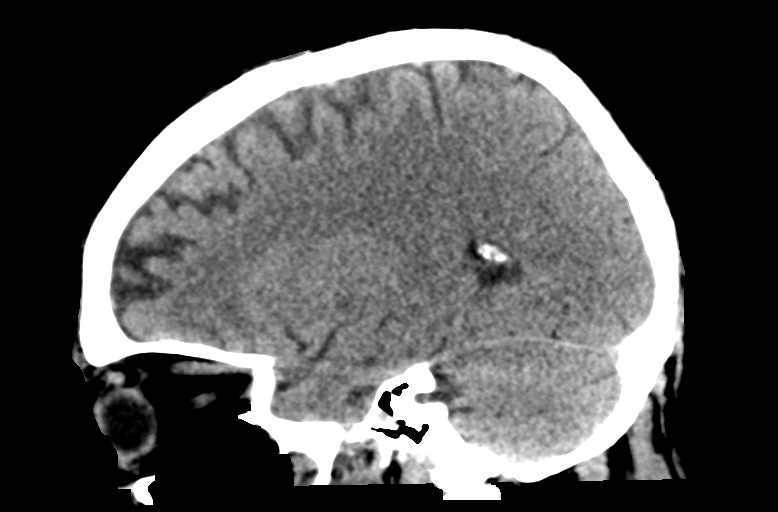

[15 of 47 positions shown; findings below may reference images not displayed]

FINDINGS: Brain: No evidence of acute infarction, hemorrhage, hydrocephalus,
extra-axial collection or mass lesion/mass effect. Areas of
suspected infarct seen on previous imaging not well demonstrated on
the current exam. Area of hypodensity in the RIGHT frontal
periventricular white matter on image 20 of series 4 appears to have
been present on the prior study better and is demonstrated on the
previous MRI.

Vascular: No hyperdense vessel or unexpected calcification.

Skull: Normal. Negative for fracture or focal lesion.

Sinuses/Orbits: Visualized paranasal sinuses and orbits are
unremarkable.

Other: None.
IMPRESSION: 1. No acute intracranial abnormality.
2. Subtle changes related to previously described areas of infarct
better demonstrated on recent MRI.

## 2020-12-22 MED ORDER — SENNOSIDES-DOCUSATE SODIUM 8.6-50 MG PO TABS
1.0000 | ORAL_TABLET | Freq: Two times a day (BID) | ORAL | Status: DC
  Administered 2020-12-22 – 2021-01-06 (×13): 1 via ORAL
  Filled 2020-12-22 (×27): qty 1

## 2020-12-22 MED ORDER — ONDANSETRON HCL 4 MG/2ML IJ SOLN: 4.0000 mg | Freq: Four times a day (QID) | INTRAMUSCULAR | Status: DC | PRN

## 2020-12-22 MED ORDER — POLYETHYLENE GLYCOL 3350 17 G PO PACK: 17.0000 g | PACK | Freq: Every day | ORAL | Status: DC | PRN

## 2020-12-22 MED ORDER — SODIUM CHLORIDE 0.9 % IV SOLN
2.0000 g | INTRAVENOUS | Status: DC
  Administered 2020-12-22: 2 g via INTRAVENOUS
  Filled 2020-12-22 (×2): qty 2

## 2020-12-22 MED ORDER — HYDROMORPHONE HCL 2 MG PO TABS
2.0000 mg | ORAL_TABLET | ORAL | Status: AC | PRN
  Administered 2020-12-22 – 2020-12-25 (×6): 2 mg via ORAL
  Filled 2020-12-22 (×6): qty 1

## 2020-12-22 MED ORDER — LACTATED RINGERS IV SOLN: INTRAVENOUS | Status: AC

## 2020-12-22 MED ORDER — ACETAMINOPHEN 650 MG RE SUPP: 650.0000 mg | Freq: Four times a day (QID) | RECTAL | Status: DC | PRN

## 2020-12-22 MED ORDER — HEPARIN SODIUM (PORCINE) 5000 UNIT/ML IJ SOLN
5000.0000 [IU] | Freq: Three times a day (TID) | INTRAMUSCULAR | Status: DC
  Administered 2020-12-22 – 2020-12-24 (×7): 5000 [IU] via SUBCUTANEOUS
  Filled 2020-12-22 (×8): qty 1

## 2020-12-22 MED ORDER — VANCOMYCIN HCL 750 MG/150ML IV SOLN: 750.0000 mg | INTRAVENOUS | Status: DC

## 2020-12-22 MED ORDER — ACETAMINOPHEN 325 MG PO TABS
650.0000 mg | ORAL_TABLET | ORAL | Status: AC
  Administered 2020-12-22: 325 mg via ORAL

## 2020-12-22 MED ORDER — NEPRO/CARBSTEADY PO LIQD
237.0000 mL | Freq: Three times a day (TID) | ORAL | Status: DC
  Administered 2020-12-23 – 2021-01-05 (×19): 237 mL via ORAL
  Filled 2020-12-22 (×6): qty 237

## 2020-12-22 MED ORDER — METHOCARBAMOL 500 MG PO TABS
500.0000 mg | ORAL_TABLET | Freq: Three times a day (TID) | ORAL | Status: DC
  Administered 2020-12-22 – 2021-01-06 (×45): 500 mg via ORAL
  Filled 2020-12-22 (×45): qty 1

## 2020-12-22 MED ORDER — ACETAMINOPHEN 325 MG PO TABS
650.0000 mg | ORAL_TABLET | Freq: Four times a day (QID) | ORAL | Status: DC | PRN
  Administered 2020-12-23 – 2021-01-03 (×14): 650 mg via ORAL
  Filled 2020-12-22 (×14): qty 2

## 2020-12-22 MED ORDER — CINACALCET HCL 30 MG PO TABS
60.0000 mg | ORAL_TABLET | Freq: Every day | ORAL | Status: DC
  Administered 2020-12-23: 60 mg via ORAL
  Filled 2020-12-22 (×2): qty 2

## 2020-12-22 MED ORDER — PREGABALIN 75 MG PO CAPS
75.0000 mg | ORAL_CAPSULE | Freq: Every day | ORAL | Status: DC
  Administered 2020-12-22 – 2021-01-06 (×16): 75 mg via ORAL
  Filled 2020-12-22 (×16): qty 1

## 2020-12-22 MED ORDER — ALTEPLASE 2 MG IJ SOLR: 2.0000 mg | Freq: Once | INTRAMUSCULAR | Status: DC | PRN

## 2020-12-22 MED ORDER — SODIUM CHLORIDE 0.9 % IV SOLN
2.0000 g | Freq: Once | INTRAVENOUS | Status: AC
  Administered 2020-12-22: 2 g via INTRAVENOUS
  Filled 2020-12-22: qty 2

## 2020-12-22 MED ORDER — CHLORHEXIDINE GLUCONATE CLOTH 2 % EX PADS
6.0000 | MEDICATED_PAD | Freq: Every day | CUTANEOUS | Status: DC
  Administered 2020-12-23 – 2021-01-04 (×11): 6 via TOPICAL

## 2020-12-22 MED ORDER — LIDOCAINE-PRILOCAINE 2.5-2.5 % EX CREA
1.0000 "application " | TOPICAL_CREAM | CUTANEOUS | Status: DC | PRN
  Filled 2020-12-22: qty 5

## 2020-12-22 MED ORDER — LIDOCAINE HCL (PF) 1 % IJ SOLN
5.0000 mL | INTRAMUSCULAR | Status: DC | PRN
  Filled 2020-12-22: qty 5

## 2020-12-22 MED ORDER — LACTATED RINGERS IV BOLUS (SEPSIS)
500.0000 mL | Freq: Once | INTRAVENOUS | Status: AC
  Administered 2020-12-22: 500 mL via INTRAVENOUS

## 2020-12-22 MED ORDER — ASPIRIN EC 325 MG PO TBEC
325.0000 mg | DELAYED_RELEASE_TABLET | Freq: Every day | ORAL | Status: DC
  Administered 2020-12-23 – 2020-12-24 (×2): 325 mg via ORAL
  Filled 2020-12-22 (×2): qty 1

## 2020-12-22 MED ORDER — ACETAMINOPHEN 500 MG PO TABS
1000.0000 mg | ORAL_TABLET | Freq: Once | ORAL | Status: AC
  Administered 2020-12-22: 1000 mg via ORAL
  Filled 2020-12-22: qty 2

## 2020-12-22 MED ORDER — SODIUM CHLORIDE 0.9 % IV SOLN: 100.0000 mL | INTRAVENOUS | Status: DC | PRN

## 2020-12-22 MED ORDER — VANCOMYCIN VARIABLE DOSE PER UNSTABLE RENAL FUNCTION (PHARMACIST DOSING): Status: DC

## 2020-12-22 MED ORDER — OXYCODONE HCL ER 15 MG PO T12A
15.0000 mg | EXTENDED_RELEASE_TABLET | Freq: Two times a day (BID) | ORAL | Status: DC
  Administered 2020-12-22 – 2021-01-01 (×19): 15 mg via ORAL
  Filled 2020-12-22 (×19): qty 1

## 2020-12-22 MED ORDER — ONDANSETRON HCL 4 MG PO TABS: 4.0000 mg | ORAL_TABLET | Freq: Four times a day (QID) | ORAL | Status: DC | PRN

## 2020-12-22 MED ORDER — ACETAMINOPHEN 325 MG PO TABS: 650.0000 mg | ORAL_TABLET | Freq: Four times a day (QID) | ORAL | Status: DC | PRN

## 2020-12-22 MED ORDER — CALCIUM ACETATE (PHOS BINDER) 667 MG PO CAPS
667.0000 mg | ORAL_CAPSULE | Freq: Three times a day (TID) | ORAL | Status: DC
  Administered 2020-12-23: 667 mg via ORAL
  Filled 2020-12-22: qty 1

## 2020-12-22 MED ORDER — SODIUM CHLORIDE 0.9% FLUSH
3.0000 mL | Freq: Two times a day (BID) | INTRAVENOUS | Status: DC
  Administered 2020-12-22 – 2021-01-06 (×29): 3 mL via INTRAVENOUS

## 2020-12-22 MED ORDER — ACETAMINOPHEN 325 MG PO TABS
ORAL_TABLET | ORAL | Status: AC
  Filled 2020-12-22: qty 2

## 2020-12-22 MED ORDER — GADOBUTROL 1 MMOL/ML IV SOLN
7.5000 mL | Freq: Once | INTRAVENOUS | Status: AC | PRN
  Administered 2020-12-22: 7.5 mL via INTRAVENOUS

## 2020-12-22 MED ORDER — ADULT MULTIVITAMIN W/MINERALS CH
1.0000 | ORAL_TABLET | Freq: Every day | ORAL | Status: DC
  Administered 2020-12-23 – 2021-01-04 (×13): 1 via ORAL
  Filled 2020-12-22 (×13): qty 1

## 2020-12-22 MED ORDER — PANTOPRAZOLE SODIUM 40 MG PO TBEC
40.0000 mg | DELAYED_RELEASE_TABLET | Freq: Every day | ORAL | Status: DC
  Administered 2020-12-23 – 2021-01-06 (×15): 40 mg via ORAL
  Filled 2020-12-22 (×15): qty 1

## 2020-12-22 MED ORDER — SEVELAMER CARBONATE 800 MG PO TABS
1600.0000 mg | ORAL_TABLET | Freq: Three times a day (TID) | ORAL | Status: DC
  Administered 2020-12-23 (×2): 1600 mg via ORAL
  Filled 2020-12-22 (×2): qty 2

## 2020-12-22 MED ORDER — PENTAFLUOROPROP-TETRAFLUOROETH EX AERO
1.0000 "application " | INHALATION_SPRAY | CUTANEOUS | Status: DC | PRN
  Filled 2020-12-22: qty 116

## 2020-12-22 MED ORDER — PROSOURCE PLUS PO LIQD
30.0000 mL | Freq: Two times a day (BID) | ORAL | Status: DC
  Administered 2021-01-02 – 2021-01-03 (×2): 30 mL via ORAL
  Filled 2020-12-22 (×8): qty 30

## 2020-12-22 MED ORDER — BISACODYL 10 MG RE SUPP: 10.0000 mg | Freq: Every day | RECTAL | Status: DC | PRN

## 2020-12-22 MED ORDER — DOCUSATE SODIUM 100 MG PO CAPS
100.0000 mg | ORAL_CAPSULE | Freq: Two times a day (BID) | ORAL | Status: DC
  Administered 2020-12-22 – 2021-01-06 (×13): 100 mg via ORAL
  Filled 2020-12-22 (×27): qty 1

## 2020-12-22 MED ORDER — ALBUTEROL SULFATE (2.5 MG/3ML) 0.083% IN NEBU: 2.5000 mg | INHALATION_SOLUTION | Freq: Four times a day (QID) | RESPIRATORY_TRACT | Status: DC | PRN

## 2020-12-22 MED ORDER — HEPARIN SODIUM (PORCINE) 1000 UNIT/ML DIALYSIS
1000.0000 [IU] | INTRAMUSCULAR | Status: DC | PRN
  Filled 2020-12-22: qty 1

## 2020-12-22 MED ORDER — VANCOMYCIN HCL 1500 MG/300ML IV SOLN
1500.0000 mg | Freq: Once | INTRAVENOUS | Status: AC
  Administered 2020-12-22: 1500 mg via INTRAVENOUS
  Filled 2020-12-22: qty 300

## 2020-12-22 NOTE — Consult Note (Signed)
[facility] Renal Consultation Note    Indication for Consultation:  Management of ESRD/hemodialysis; anemia, hypertension/volume and secondary hyperparathyroidism  [id number], Myra Rude, MD  HPI: Patrick Anthony is a 50 y.o. male. ESRD on HD MWF at [facility].  Past medical history significant for Hep C with cirrhosis, chronic diastolic HF, OSA, HLD, and HTN.  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    Patient sent to the ED this AM by SNF due to confusion.  Seen and examined at bedside in ED.  Reports remembers being confused, was asked questions at the SNF this AM but was unable to find the answers.  Does not specifically remember why he came to the ED.  Biggest complaint is back pain.  States the started a few weeks ago and has progressively worsened.  Denies CP, SOB, orthopnea, n/v/d, abdominal pain, weakness, dizziness and fatigue.  Reports occasional tremor that has been going on for weeks.  Recent weight loss due to not eating as much because he does not like the food at SNF. Dry weight has recently been adjusted but patient left 0.5kg under it last HD.  Able to answer a majority of questions but I have to continue to redirect.  Quickly forgetting things we just discussed.    Pertinent findings in work up include AMS, t max 103, hypotension, tachycardia, hypoxia, and CXR suspicious for pulmonary edema.  Patient has been admitted for further evaluation and management.    Past Medical History:  Diagnosis Date  . 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 Chronic kidney disease    patient on dialysis tues-thurs-sat  . Cirrhosis of liver ([place])   . Drug addiction ([place])   . ETOH  abuse   . Hepatitis   . Hypertension   . Mitral regurgitation   . Patent foramen ovale 10/23/2019  . Renal disorder renal failure  . S/P minimally-invasive mitral valve replacement with bioprosthetic valve 10/14/2020   31 mm Medtronic Mosaic stented porcine bioprosthetic tissue valve via right mini thoracotomy approach  . Sleep apnea   . Systolic and diastolic CHF, 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[place]) 04/19/2013   Past Surgical History:  Procedure Laterality Date  . AMPUTATION Right 10/29/2020   Procedure: RIGHT ABOVE KNEE AMPUTATION;  Surgeon: [name], MD;  Location: [place];  Service: Orthopedics;  Laterality: Right;  . AV FISTULA PLACEMENT Right   . AV FISTULA PLACEMENT Left 09/03/2013   Procedure: ARTERIOVENOUS (AV) FISTULA CREATION - LEFT BRACHIOCEPHALIC;  Surgeon: Conrad Firth, MD;  [facility] OR;  Service: Vascular;  Laterality: Left;  . AV FISTULA PLACEMENT Left 10/30/2019   Procedure: INSERTION OF ARTERIOVENOUS (AV) GORE-TEX GRAFT ARM;  Surgeon: Rosetta Posner, MD;  Location: [place];  Service: Vascular;  Laterality: Left;  . BUBBLE STUDY  10/05/2020   Procedure: BUBBLE STUDY;  Surgeon: Pixie Casino, MD;  Location: [place];  Service: Cardiovascular;;  . I & D EXTREMITY Right 10/20/2020   Procedure: IRRIGATION AND DEBRIDEMENT OF LEG;  Surgeon: [name], MD;  Location: [place];  Service: Orthopedics;  Laterality: Right;  . INSERTION OF DIALYSIS CATHETER N/A 08/29/2013   Procedure: INSERTION OF DIALYSIS CATHETER;  Surgeon:  Mal Misty, MD;  Location: [facility] OR;  Service: Vascular;  Laterality: N/A;  . INSERTION OF DIALYSIS CATHETER Left 10/30/2019   Procedure: INSERTION OF DIALYSIS CATHETER LEFT INTERNAL JUGULAR;  Surgeon: Rosetta Posner, MD;  Location: [place];  Service: Vascular;  Laterality: Left;  . LEFT HEART CATH AND CORONARY ANGIOGRAPHY N/A 10/07/2020   Procedure: LEFT HEART CATH AND CORONARY ANGIOGRAPHY;  Surgeon: Lorretta Harp, MD;  Location: [place] CV LAB;  Service:  Cardiovascular;  Laterality: N/A;  . MITRAL VALVE REPAIR Right 10/14/2020   Procedure: MINIMALLY INVASIVE MITRAL VALVE  REPLACEMENT(MVR) USING MEDTRONIC MOSAIC VALVE SIZE 31MM;  Surgeon: Rexene Alberts, MD;  Location: [place];  Service: Open Heart Surgery;  Laterality: Right;  . MULTIPLE EXTRACTIONS WITH ALVEOLOPLASTY N/A 10/12/2020   Procedure: MULTIPLE EXTRACTION WITH ALVEOLOPLASTY;  Surgeon: [name], DMD;  Location: [place];  Service: Dentistry;  Laterality: N/A;  . PERIPHERAL VASCULAR CATHETERIZATION N/A 01/06/2015   Procedure: A/V Shuntogram/Fistulagram;  Surgeon: Katha Cabal, MD;  Location: [place] CV LAB;  Service: Cardiovascular;  Laterality: N/A;  . PERIPHERAL VASCULAR CATHETERIZATION Left 01/06/2015   Procedure: A/V Shunt Intervention;  Surgeon: Katha Cabal, MD;  Location: [place] CV LAB;  Service: Cardiovascular;  Laterality: Left;  . REPAIR OF PATENT FORAMEN OVALE N/A 10/14/2020   Procedure: CLOSURE OF PATENT FORAMEN OVALE;  Surgeon: Rexene Alberts, MD;  Location: [place];  Service: Open Heart Surgery;  Laterality: N/A;  . TEE WITHOUT CARDIOVERSION N/A 10/05/2020   Procedure: TRANSESOPHAGEAL ECHOCARDIOGRAM (TEE);  Surgeon: Pixie Casino, MD;  Location: [place];  Service: Cardiovascular;  Laterality: N/A;  . TEE WITHOUT CARDIOVERSION N/A 10/14/2020   Procedure: TRANSESOPHAGEAL ECHOCARDIOGRAM (TEE);  Surgeon: Rexene Alberts, MD;  Location: [place];  Service: Open Heart Surgery;  Laterality: N/A;   Family History  Problem Relation Age of Onset  . Hypertension Mother   . Cancer Mother        breast   Social History:  reports that he quit smoking about 3 years ago. His smoking use included cigarettes. He smoked 0.20 packs per day. He has never used smokeless tobacco. He reports that he does not drink alcohol and does not use drugs. No Known Allergies Prior to Admission medications   Medication Sig Start Date End Date Taking? Authorizing Provider   acetaminophen (TYLENOL) 325 MG tablet Take 650 mg by mouth every 6 (six) hours as needed for mild pain. Temp > 100.5   Yes [provider]  aspirin EC 325 MG EC tablet Take 1 tablet (325 mg total) by mouth daily. 11/10/20  Yes Samella Parr, NP  bisacodyl (DULCOLAX) 10 MG suppository Place 1 suppository (10 mg total) rectally daily as needed for moderate constipation. 11/09/20  Yes Samella Parr, NP  calcium acetate (PHOSLO) 667 MG capsule Take 1 capsule (667 mg total) by mouth 3 (three) times daily with meals. 11/09/20  Yes Samella Parr, NP  cinacalcet (SENSIPAR) 30 MG tablet Take 2 tablets (60 mg total) by mouth daily with breakfast. 11/10/20  Yes Samella Parr, NP  cloNIDine (CATAPRES) 0.2 MG tablet Take 0.2 mg by mouth 2 (two) times daily.   Yes [provider]  docusate sodium (COLACE) 100 MG capsule Take 1 capsule (100 mg total) by mouth 2 (two) times daily. 11/09/20  Yes Samella Parr, NP  HYDROmorphone (DILAUDID) 2 MG tablet Take 1 tablet (2 mg total) by mouth every 4 (four) hours as needed  for severe pain (Not controlled by oxycontin). 11/09/20  Yes Samella Parr, NP  methocarbamol (ROBAXIN) 500 MG tablet Take 1 tablet (500 mg total) by mouth 3 (three) times daily. 11/09/20  Yes Samella Parr, NP  metoprolol tartrate (LOPRESSOR) 50 MG tablet Take 1 tablet (50 mg total) by mouth 2 (two) times daily. 11/09/20  Yes Samella Parr, NP  Multiple Vitamin (MULTIVITAMIN WITH MINERALS) TABS tablet Take 1 tablet by mouth daily.   Yes [provider]  nitroGLYCERIN (NITROSTAT) 0.4 MG SL tablet Place 0.4 mg under the tongue every 5 (five) minutes x 3 doses as needed for chest pain.    Yes [provider]  Nutritional Supplements (,FEEDING SUPPLEMENT, PROSOURCE PLUS) liquid Take 30 mLs by mouth 2 (two) times daily between meals. 11/09/20  Yes Samella Parr, NP  Nutritional Supplements (FEEDING SUPPLEMENT, NEPRO CARB STEADY,) LIQD Take 237 mLs by mouth 3 (three)  times daily between meals. 11/09/20  Yes Samella Parr, NP  oxyCODONE (OXYCONTIN) 15 mg 12 hr tablet Take 1 tablet (15 mg total) by mouth every 12 (twelve) hours. 11/09/20  Yes Samella Parr, NP  pantoprazole (PROTONIX) 40 MG tablet Take 1 tablet (40 mg total) by mouth daily. 11/10/20  Yes Samella Parr, NP  polyethylene glycol (MIRALAX / GLYCOLAX) 17 g packet Take 17 g by mouth daily as needed for mild constipation. 11/09/20  Yes Samella Parr, NP  pregabalin (LYRICA) 75 MG capsule Take 1 capsule (75 mg total) by mouth at bedtime. 11/09/20  Yes Samella Parr, NP  senna-docusate (SENOKOT-S) 8.6-50 MG tablet Take 1 tablet by mouth 2 (two) times daily. 11/09/20  Yes Samella Parr, NP  sevelamer carbonate (RENVELA) 800 MG tablet Take 1,600 mg by mouth 3 (three) times daily with meals.   Yes [provider]  vancomycin 750 mg in sodium chloride 0.9 % 150 mL Inject 750 mg into the vein every Monday, Wednesday, and Friday with hemodialysis.   Yes [provider]  Darbepoetin Alfa (ARANESP, ALBUMIN FREE, IJ) Darbepoetin Alfa (Aranesp) 07/05/20 07/04/21  [provider]   Current Facility-Administered Medications  Medication Dose Route Frequency Provider Last Rate Last Admin  . 0.9 %  sodium chloride infusion  100 mL Intravenous PRN Ezell Poke, Ria Comment, PA      . 0.9 %  sodium chloride infusion  100 mL Intravenous PRN Ziare Cryder, Ria Comment, PA      . alteplase (CATHFLO ACTIVASE) injection 2 mg  2 mg Intracatheter Once PRN Albina Gosney, Ria Comment, PA      . ceFEPIme (MAXIPIME) 2 g in sodium chloride 0.9 % 100 mL IVPB  2 g Intravenous Q M,W,F-HD Carmin Muskrat, MD      . Chlorhexidine Gluconate Cloth 2 % PADS 6 each  6 each Topical Q0600 Currie Dennin, PA      . heparin injection 1,000 Units  1,000 Units Dialysis PRN Sukhmani Fetherolf, Ria Comment, PA      . lactated ringers infusion   Intravenous Continuous Carmin Muskrat, MD 150 mL/hr at 12/22/20 0806 New Bag at 12/22/20 [id number]  .  lidocaine (PF) (XYLOCAINE) 1 % injection 5 mL  5 mL Intradermal PRN Makaelah Cranfield, Ria Comment, PA      . lidocaine-prilocaine (EMLA) cream 1 application  1 application Topical PRN Shaneese Tait, Ria Comment, PA      . pentafluoroprop-tetrafluoroeth (GEBAUERS) aerosol 1 application  1 application Topical PRN Ahmya Bernick, [place]      . vancomycin variable dose per unstable renal function (pharmacist dosing)  Does not apply See admin instructions Carmin Muskrat, MD       Current Outpatient Medications  Medication Sig Dispense Refill  . acetaminophen (TYLENOL) 325 MG tablet Take 650 mg by mouth every 6 (six) hours as needed for mild pain. Temp > 100.5    . aspirin EC 325 MG EC tablet Take 1 tablet (325 mg total) by mouth daily. 30 tablet 0  . bisacodyl (DULCOLAX) 10 MG suppository Place 1 suppository (10 mg total) rectally daily as needed for moderate constipation. 12 suppository 0  . calcium acetate (PHOSLO) 667 MG capsule Take 1 capsule (667 mg total) by mouth 3 (three) times daily with meals.    . cinacalcet (SENSIPAR) 30 MG tablet Take 2 tablets (60 mg total) by mouth daily with breakfast. 60 tablet   . cloNIDine (CATAPRES) 0.2 MG tablet Take 0.2 mg by mouth 2 (two) times daily.    Marland Kitchen docusate sodium (COLACE) 100 MG capsule Take 1 capsule (100 mg total) by mouth 2 (two) times daily. 10 capsule 0  . HYDROmorphone (DILAUDID) 2 MG tablet Take 1 tablet (2 mg total) by mouth every 4 (four) hours as needed for severe pain (Not controlled by oxycontin). 30 tablet 0  . methocarbamol (ROBAXIN) 500 MG tablet Take 1 tablet (500 mg total) by mouth 3 (three) times daily.    . metoprolol tartrate (LOPRESSOR) 50 MG tablet Take 1 tablet (50 mg total) by mouth 2 (two) times daily.    . Multiple Vitamin (MULTIVITAMIN WITH MINERALS) TABS tablet Take 1 tablet by mouth daily.    . nitroGLYCERIN (NITROSTAT) 0.4 MG SL tablet Place 0.4 mg under the tongue every 5 (five) minutes x 3 doses as needed for chest pain.     . Nutritional  Supplements (,FEEDING SUPPLEMENT, PROSOURCE PLUS) liquid Take 30 mLs by mouth 2 (two) times daily between meals.    . Nutritional Supplements (FEEDING SUPPLEMENT, NEPRO CARB STEADY,) LIQD Take 237 mLs by mouth 3 (three) times daily between meals.  0  . oxyCODONE (OXYCONTIN) 15 mg 12 hr tablet Take 1 tablet (15 mg total) by mouth every 12 (twelve) hours. 14 tablet 0  . pantoprazole (PROTONIX) 40 MG tablet Take 1 tablet (40 mg total) by mouth daily.    . polyethylene glycol (MIRALAX / GLYCOLAX) 17 g packet Take 17 g by mouth daily as needed for mild constipation. 14 each 0  . pregabalin (LYRICA) 75 MG capsule Take 1 capsule (75 mg total) by mouth at bedtime. 60 capsule 0  . senna-docusate (SENOKOT-S) 8.6-50 MG tablet Take 1 tablet by mouth 2 (two) times daily.    . sevelamer carbonate (RENVELA) 800 MG tablet Take 1,600 mg by mouth 3 (three) times daily with meals.    . vancomycin 750 mg in sodium chloride 0.9 % 150 mL Inject 750 mg into the vein every Monday, Wednesday, and Friday with hemodialysis.    . Darbepoetin Alfa (ARANESP, ALBUMIN FREE, IJ) Darbepoetin Alfa (Aranesp)     Facility-Administered Medications Ordered in Other Encounters  Medication Dose Route Frequency Provider Last Rate Last Admin  . regadenoson (LEXISCAN) injection SOLN 0.4 mg  0.4 mg Intravenous Once Cherlynn Kaiser A, MD      . technetium tetrofosmin (TC-MYOVIEW) injection [id number] millicurie  [id number] millicurie Intravenous Once PRN Cherlynn Kaiser A, MD      . technetium tetrofosmin (TC-MYOVIEW) injection [id number] millicurie  [id number] millicurie Intravenous Once PRN Elouise Munroe, MD       Labs: Basic Metabolic Panel: Recent Labs  Lab 12/22/20 0714  NA 133*  K 4.4  CL 92*  CO2 25  GLUCOSE 82  BUN 56*  CREATININE 8.20*  CALCIUM 8.8*   Liver Function Tests: Recent Labs  Lab 12/22/20 0714  AST 20  ALT 10  ALKPHOS 74  BILITOT 1.0  PROT 8.4*  ALBUMIN 2.4*   CBC: Recent Labs  Lab 12/22/20 0714  WBC 20.2*   NEUTROABS 18.0*  HGB 9.4*  HCT 32.0*  MCV 82.5  PLT 215   CBG: Recent Labs  Lab 12/22/20 1402  GLUCAP 83   Studies/Results: DG Chest Port 1 View  Result Date: 12/22/2020 CLINICAL DATA:  50 year old male with possible sepsis. Dialysis patient. Weakness. EXAM: PORTABLE CHEST 1 VIEW COMPARISON:  Chest radiographs 12/13/2020 and earlier. FINDINGS: Portable AP semi upright view at 0734 hours. Lower lung volumes. Stable cardiomegaly and mediastinal contours. Visualized tracheal air column is within normal limits. No pneumothorax. Chronic architectural distortion in the right lung with curvilinear opacity is stable. Mild hypo ventilation and atelectasis now at both lung bases. No consolidation. No pleural effusion is evident. Stable visualized osseous structures. Abundant chronic dystrophic calcification about the visible right shoulder. Chronic left subclavian vascular stent. Paucity of bowel gas in the upper abdomen. IMPRESSION: 1. Lower lung volumes with atelectasis. 2. Chronic cardiomegaly and right lung scarring. Electronically Signed   By: Genevie Ann M.D.   On: 12/22/2020 07:50    ROS: Limited by current mental status, see HPI.   Physical Exam: Vitals:   12/22/20 1400 12/22/20 1415 12/22/20 1430 12/22/20 1445  BP: '97/73 96/69 99/82 '$ 99/75  Pulse: 94 80 85 83  Resp: '18 17 16 16  '$ Temp:      TempSrc:      SpO2: 98% 98% 93% 92%  Height:         General: WDWN male in NAD Head: NCAT sclera not icteric MMM Neck: Supple. No lymphadenopathy Lungs: mostly CTA bilaterally, +rhonchi in lower lobes, but improved with deep inhalation.  Breathing is unlabored on 2L via [place]. Heart: +tachycardia. No murmur, rubs or gallops apprecaited.  Abdomen: soft, nontender, +BS, no guarding, no rebound tenderness Lower extremities: R AKA, no stump edema, no LLE edema, no edema, ischemic changes, or open wounds  Neuro: Alert, oriented to name, bday, place, month and year. Moves all extremities  spontaneously. Psych:  Responds to questions appropriately with a normal affect. Dialysis Access: LU AVF +b/t  Dialysis Orders:  MWF - [place]  4hrs, BFR 350, DFR 500,  EDW 72kg, 2K/ 2Ca Profile 2  Access: LU AVG Heparin 5500 unit qHD Mircera 225 mcg q2wks - last 5/9 Venofer '100mg'$  qHD x5 - 1 completed  Assessment/Plan: 1.  AMS  -  Improving but still not at baseline. Hx embolic stroke. Etiology unclear. CT head pending.BC ordered. 2. Fever/leukocytosis - etiology unclear. WBC 20.2. tmax 103. Work up ongoing. BC ordered. 3. Back pain - MRI with contrast ordered.  Plan for HD this afternoon to clear contrast.  4. Hypoxia - improving. CXR with scaring but suspicious for pulmonary edema, exam reassuring. UF as tolerated.    5.  ESRD -  On HD MWF.  Orders written for HD following MRI, on regular schedule.  6.  Hypertension/volume  - Hypotension today which is not typical for him.  Hold home meds for now.  Does not appear grossly volume overloaded.   7.  Anemia of CKD - Hgb 9.4 - ESA recently dosed.  Hold IV iron due to concern for possible  acute infection. 8.  Secondary Hyperparathyroidism -  Ca in goal. Will check phos.  Continue Auryxia 2AC TID and sensipar '60mg'$  qd.  Not on VDRA. 9.  Nutrition - Renal diet w/fluid restriction. Protein supplement.    Jen Mow, PA-C [facility] 12/22/2020, 3:05 PM

## 2020-12-22 NOTE — Sepsis Progress Note (Signed)
eLink is following this Code Sepsis. °

## 2020-12-22 NOTE — ED Notes (Signed)
Patient transported to MRI 

## 2020-12-22 NOTE — Procedures (Signed)
   I was present at this dialysis session, have reviewed the session itself and made  appropriate changes Rob Breylan Lefevers MD [facility] pager [phone]   12/22/2020, 4:55 PM    

## 2020-12-22 NOTE — ED Notes (Signed)
Pt transported to Dialysis ?

## 2020-12-22 NOTE — ED Notes (Signed)
Pt stated on 2L  due to desatting into 80s

## 2020-12-22 NOTE — ED Notes (Signed)
Pt given table side fan

## 2020-12-22 NOTE — ED Provider Notes (Signed)
[facility] EMERGENCY DEPARTMENT Provider Note   CSN: [id number] Arrival date & time: 12/22/20  [id number]     History Chief Complaint  Patient presents with  . Altered Mental Status  . Fever    Patrick Anthony is a 50 y.o. male.  HPI Lindan Brothers is a 50 y.o. male with a Past Medical History of ESRD on HD, MWF, OSA, chronic diastolic CHF, hyperlipidemia who had a very lengthy hospitalization.  Mainly was treated for MRSA bacteremia as well as mitral valve endocarditis with PFO as well as septic emboli to brain.  Also had osteomyelitis and abscess of the right tibia status post right AKA on 10/29/2020 by Dr. Sharol Given.   Patient presents today from his nursing facility with staff concern for confusion.  Where is reportedly typically ANO x4, today he was reportedly ANO x2.  Patient denies pain, fevers, nausea, vomiting.  However, patient is a repetitive historian, is not oriented to place.  He does seemingly offer answers that are appropriate to questions related to his current situation, but otherwise cannot provide it answers in-depth regarding his current situation.  Level 5 caveat secondary to acuity of condition/change in mental status.    Past Medical History:  Diagnosis Date  . 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 Chronic kidney disease    patient on dialysis tues-thurs-sat  . Cirrhosis of liver ([place])   . Drug addiction ([place])   . ETOH abuse   . Hepatitis   . Hypertension   . Mitral regurgitation   . Patent foramen ovale 10/23/2019  . Renal disorder renal failure  . S/P minimally-invasive mitral valve replacement with bioprosthetic valve 10/14/2020   31 mm Medtronic Mosaic stented porcine bioprosthetic tissue valve via right mini thoracotomy approach  . Sleep apnea   . Systolic and diastolic CHF, 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[place]) 04/19/2013    Patient Active Problem List    Diagnosis Date Noted  . Moderate protein-calorie malnutrition ([place]) 11/11/2020  . Other acute osteomyelitis, right tibia and fibula ([place]) 11/11/2020  . S/P AKA (above knee amputation), right ([place])   . 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 Chronic osteomyelitis of right tibia with draining sinus ([place])   . Abscess of right leg   . S/P mitral valve replacement   . S/P mitral valve repair   . S/P minimally-invasive mitral valve replacement with bioprosthetic valve 10/14/2020  . Dental caries   . Chronic apical periodontitis   . Chronic periodontitis   . Accretions on teeth   . Mitral regurgitation   . Uncontrolled hypertension   . Patent foramen ovale   . Cerebral embolism with cerebral infarction 09/29/2020  . 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 Bacteremia due to Staphylococcus aureus   . 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 Lactic acidosis 09/27/2020  . Bacterial endocarditis 09/27/2020  . Allergy, unspecified, subsequent encounter 04/15/2020  . Anaphylactic shock, unspecified, subsequent encounter 04/15/2020  . Complication of vascular dialysis catheter 10/31/2019  . Kidney disease 02/04/2019  . Personal history of Methicillin resistant Staphylococcus aureus infection 09/11/2018  . Encounter for immunization 09/07/2017  . Dependence on renal dialysis ([place]) 03/07/2017  . Iron deficiency anemia, unspecified 11/15/2015  . Coagulation defect, unspecified ([place]) 10/08/2015  . Gastrointestinal hemorrhage, unspecified  10/08/2015  . Pain, unspecified 10/08/2015  . Secondary hyperparathyroidism of renal origin ([place]) 10/08/2015  . End stage renal disease ([place]) 10/17/2013  . Anemia in chronic kidney disease 08/30/2013  . Hepatitis C 08/30/2013  . ESRD on dialysis ([place]) 08/30/2013  . Hypertensive crisis 08/28/2013  . Hypertensive emergency 04/20/2013  .  Chronic combined systolic and diastolic congestive heart failure ([place]) 04/19/2013  . HTN (hypertension) 04/19/2013    Past Surgical History:  Procedure Laterality Date  . AMPUTATION Right 10/29/2020   Procedure: RIGHT ABOVE KNEE AMPUTATION;  Surgeon: [name], MD;  Location: [facility];  Service: Orthopedics;  Laterality: Right;  . AV FISTULA PLACEMENT Right   . AV FISTULA PLACEMENT Left 09/03/2013   Procedure: ARTERIOVENOUS (AV) FISTULA CREATION - LEFT BRACHIOCEPHALIC;  Surgeon: Conrad Warren Park, MD;  Location: [facility] OR;  Service: Vascular;  Laterality: Left;  . AV FISTULA PLACEMENT Left 10/30/2019   Procedure: INSERTION OF ARTERIOVENOUS (AV) GORE-TEX GRAFT ARM;  Surgeon: Rosetta Posner, MD;  Location: [facility];  Service: Vascular;  Laterality: Left;  . BUBBLE STUDY  10/05/2020   Procedure: BUBBLE STUDY;  Surgeon: Pixie Casino, MD;  Location: [facility];  Service: Cardiovascular;;  . I & D EXTREMITY Right 10/20/2020   Procedure: IRRIGATION AND DEBRIDEMENT OF LEG;  Surgeon: [name], MD;  Location: [facility];  Service: Orthopedics;  Laterality: Right;  . INSERTION OF DIALYSIS CATHETER N/A 08/29/2013   Procedure: INSERTION OF DIALYSIS CATHETER;  Surgeon: Mal Misty, MD;  Location: [facility];  Service: Vascular;  Laterality: N/A;  . INSERTION OF DIALYSIS CATHETER Left 10/30/2019   Procedure: INSERTION OF DIALYSIS CATHETER LEFT INTERNAL JUGULAR;  Surgeon: Rosetta Posner, MD;  Location: [facility];  Service: Vascular;  Laterality: Left;  . LEFT HEART CATH AND CORONARY ANGIOGRAPHY N/A 10/07/2020   Procedure: LEFT HEART CATH AND CORONARY ANGIOGRAPHY;  Surgeon: Lorretta Harp, MD;  Location: [facility] CV LAB;  Service: Cardiovascular;  Laterality: N/A;  . MITRAL VALVE REPAIR Right 10/14/2020   Procedure: MINIMALLY INVASIVE MITRAL VALVE  REPLACEMENT(MVR) USING MEDTRONIC MOSAIC VALVE SIZE 31MM;  Surgeon: Rexene Alberts, MD;  Location: [facility];  Service: Open Heart Surgery;  Laterality: Right;  . MULTIPLE  EXTRACTIONS WITH ALVEOLOPLASTY N/A 10/12/2020   Procedure: MULTIPLE EXTRACTION WITH ALVEOLOPLASTY;  Surgeon: [name], DMD;  Location: [facility];  Service: Dentistry;  Laterality: N/A;  . PERIPHERAL VASCULAR CATHETERIZATION N/A 01/06/2015   Procedure: A/V Shuntogram/Fistulagram;  Surgeon: Katha Cabal, MD;  Location: [facility] LAB;  Service: Cardiovascular;  Laterality: N/A;  . PERIPHERAL VASCULAR CATHETERIZATION Left 01/06/2015   Procedure: A/V Shunt Intervention;  Surgeon: Katha Cabal, MD;  Location: [facility] CV LAB;  Service: Cardiovascular;  Laterality: Left;  . REPAIR OF PATENT FORAMEN OVALE N/A 10/14/2020   Procedure: CLOSURE OF PATENT FORAMEN OVALE;  Surgeon: Rexene Alberts, MD;  Location: [facility];  Service: Open Heart Surgery;  Laterality: N/A;  . TEE WITHOUT CARDIOVERSION N/A 10/05/2020   Procedure: TRANSESOPHAGEAL ECHOCARDIOGRAM (TEE);  Surgeon: Pixie Casino, MD;  Location: [facility];  Service: Cardiovascular;  Laterality: N/A;  . TEE WITHOUT CARDIOVERSION N/A 10/14/2020   Procedure: TRANSESOPHAGEAL ECHOCARDIOGRAM (TEE);  Surgeon: Rexene Alberts, MD;  Location: [facility];  Service: Open Heart Surgery;  Laterality: N/A;       Family History  Problem Relation Age of Onset  . Hypertension Mother   . Cancer Mother        breast  Social History   Tobacco Use  . Smoking status: Former Smoker    Packs/day: 0.20    Types: Cigarettes    Quit date: 10/05/2017    Years since quitting: 3.2  . Smokeless tobacco: Never Used  . Tobacco comment: Quit 3 years ago  Vaping Use  . Vaping Use: Never used  Substance Use Topics  . Alcohol use: No  . Drug use: No    Comment: Quit    Home Medications Prior to Admission medications   Medication Sig Start Date End Date Taking? Authorizing Provider  acetaminophen (TYLENOL) 325 MG tablet Take 650 mg by mouth every 6 (six) hours as needed for mild pain. Temp > 100.5   Yes [provider]  aspirin EC 325 MG EC  tablet Take 1 tablet (325 mg total) by mouth daily. 11/10/20  Yes Samella Parr, NP  bisacodyl (DULCOLAX) 10 MG suppository Place 1 suppository (10 mg total) rectally daily as needed for moderate constipation. 11/09/20  Yes Samella Parr, NP  calcium acetate (PHOSLO) 667 MG capsule Take 1 capsule (667 mg total) by mouth 3 (three) times daily with meals. 11/09/20  Yes Samella Parr, NP  cinacalcet (SENSIPAR) 30 MG tablet Take 2 tablets (60 mg total) by mouth daily with breakfast. 11/10/20  Yes Samella Parr, NP  cloNIDine (CATAPRES) 0.2 MG tablet Take 0.2 mg by mouth 2 (two) times daily.   Yes [provider]  docusate sodium (COLACE) 100 MG capsule Take 1 capsule (100 mg total) by mouth 2 (two) times daily. 11/09/20  Yes Samella Parr, NP  HYDROmorphone (DILAUDID) 2 MG tablet Take 1 tablet (2 mg total) by mouth every 4 (four) hours as needed for severe pain (Not controlled by oxycontin). 11/09/20  Yes Samella Parr, NP  methocarbamol (ROBAXIN) 500 MG tablet Take 1 tablet (500 mg total) by mouth 3 (three) times daily. 11/09/20  Yes Samella Parr, NP  metoprolol tartrate (LOPRESSOR) 50 MG tablet Take 1 tablet (50 mg total) by mouth 2 (two) times daily. 11/09/20  Yes Samella Parr, NP  Multiple Vitamin (MULTIVITAMIN WITH MINERALS) TABS tablet Take 1 tablet by mouth daily.   Yes [provider]  nitroGLYCERIN (NITROSTAT) 0.4 MG SL tablet Place 0.4 mg under the tongue every 5 (five) minutes x 3 doses as needed for chest pain.    Yes [provider]  Nutritional Supplements (,FEEDING SUPPLEMENT, PROSOURCE PLUS) liquid Take 30 mLs by mouth 2 (two) times daily between meals. 11/09/20  Yes Samella Parr, NP  Nutritional Supplements (FEEDING SUPPLEMENT, NEPRO CARB STEADY,) LIQD Take 237 mLs by mouth 3 (three) times daily between meals. 11/09/20  Yes Samella Parr, NP  oxyCODONE (OXYCONTIN) 15 mg 12 hr tablet Take 1 tablet (15 mg total) by mouth every 12 (twelve) hours. 11/09/20   Yes Samella Parr, NP  pantoprazole (PROTONIX) 40 MG tablet Take 1 tablet (40 mg total) by mouth daily. 11/10/20  Yes Samella Parr, NP  polyethylene glycol (MIRALAX / GLYCOLAX) 17 g packet Take 17 g by mouth daily as needed for mild constipation. 11/09/20  Yes Samella Parr, NP  pregabalin (LYRICA) 75 MG capsule Take 1 capsule (75 mg total) by mouth at bedtime. 11/09/20  Yes Samella Parr, NP  senna-docusate (SENOKOT-S) 8.6-50 MG tablet Take 1 tablet by mouth 2 (two) times daily. 11/09/20  Yes Samella Parr, NP  sevelamer carbonate (RENVELA) 800 MG tablet Take 1,600 mg by mouth 3 (  three) times daily with meals.   Yes [provider]  vancomycin 750 mg in sodium chloride 0.9 % 150 mL Inject 750 mg into the vein every Monday, Wednesday, and Friday with hemodialysis.   Yes [provider]  Darbepoetin Alfa (ARANESP, ALBUMIN FREE, IJ) Darbepoetin Alfa (Aranesp) 07/05/20 07/04/21  [provider]    Allergies    Patient has no known allergies.  Review of Systems   Review of Systems  Unable to perform ROS: Acuity of condition    Physical Exam Updated Vital Signs BP 96/69   Pulse 80   Temp 98.6 F (37 C) (Oral)   Resp 17   Ht 6' (1.829 m)   SpO2 98%   BMI 23.92 kg/m   Physical Exam Vitals and nursing note reviewed.  Constitutional:      Appearance: He is well-developed. He is not diaphoretic.  HENT:     Head: Normocephalic and atraumatic.  Eyes:     Conjunctiva/sclera: Conjunctivae normal.  Cardiovascular:     Rate and Rhythm: Regular rhythm. Tachycardia present.  Pulmonary:     Effort: Tachypnea present. No respiratory distress.     Comments: Breath sounds clear in the apices Abdominal:     General: There is no distension.  Musculoskeletal:     Comments: Right AKA unremarkable stump. Left upper extremity AV fistula with palpable thrill, otherwise upper extremities unremarkable.  Skin:    General: Skin is warm and dry.  Neurological:      Mental Status: He is alert.     Comments: Perseverant historian repetitive details about place, current condition.  Face is symmetric, speech is otherwise clear.  Psychiatric:        Mood and Affect: Mood normal.     ED Results / Procedures / Treatments   Labs (all labs ordered are listed, but only abnormal results are displayed) Labs Reviewed  COMPREHENSIVE METABOLIC PANEL - Abnormal; Notable for the following components:      Result Value   Sodium 133 (*)    Chloride 92 (*)    BUN 56 (*)    Creatinine, Ser 8.20 (*)    Calcium 8.8 (*)    Total Protein 8.4 (*)    Albumin 2.4 (*)    GFR, Estimated 7 (*)    Anion gap 16 (*)    All other components within normal limits  CBC WITH DIFFERENTIAL/PLATELET - Abnormal; Notable for the following components:   WBC 20.2 (*)    RBC 3.88 (*)    Hemoglobin 9.4 (*)    HCT 32.0 (*)    MCH 24.2 (*)    MCHC 29.4 (*)    RDW 19.9 (*)    Neutro Abs 18.0 (*)    Lymphs Abs 0.5 (*)    Monocytes Absolute 1.4 (*)    Abs Immature Granulocytes 0.23 (*)    All other components within normal limits  PROTIME-INR - Abnormal; Notable for the following components:   Prothrombin Time 15.9 (*)    INR 1.3 (*)    All other components within normal limits  APTT - Abnormal; Notable for the following components:   aPTT 40 (*)    All other components within normal limits  RESP PANEL BY RT-PCR (FLU A&B, COVID) [id number]  CULTURE, BLOOD (ROUTINE X 2)  CULTURE, BLOOD (ROUTINE X 2)  URINE CULTURE  LACTIC ACID, PLASMA  URINALYSIS, ROUTINE W REFLEX MICROSCOPIC  CBG MONITORING, ED    EKG EKG Interpretation  Date/Time:  Wednesday Dec 22 2020 07:13:43 EDT Ventricular Rate:  112 PR Interval:  212 QRS Duration: 83 QT Interval:  360 QTC Calculation: 492 R Axis:   26 Text Interpretation: Sinus tachycardia T wave abnormality Abnormal ECG Prolonged PR interval Left atrial enlargement Confirmed by Carmin Muskrat [phone]) on 12/22/2020 7:46:36 AM   Radiology DG  Chest Port 1 View  Result Date: 12/22/2020 CLINICAL DATA:  50 year old male with possible sepsis. Dialysis patient. Weakness. EXAM: PORTABLE CHEST 1 VIEW COMPARISON:  Chest radiographs 12/13/2020 and earlier. FINDINGS: Portable AP semi upright view at 0734 hours. Lower lung volumes. Stable cardiomegaly and mediastinal contours. Visualized tracheal air column is within normal limits. No pneumothorax. Chronic architectural distortion in the right lung with curvilinear opacity is stable. Mild hypo ventilation and atelectasis now at both lung bases. No consolidation. No pleural effusion is evident. Stable visualized osseous structures. Abundant chronic dystrophic calcification about the visible right shoulder. Chronic left subclavian vascular stent. Paucity of bowel gas in the upper abdomen. IMPRESSION: 1. Lower lung volumes with atelectasis. 2. Chronic cardiomegaly and right lung scarring. Electronically Signed   By: Genevie Ann M.D.   On: 12/22/2020 07:50    Procedures Procedures   Medications Ordered in ED Medications  lactated ringers infusion ( Intravenous New Bag/Given 12/22/20 0806)  ceFEPIme (MAXIPIME) 2 g in sodium chloride 0.9 % 100 mL IVPB (has no administration in time range)  Chlorhexidine Gluconate Cloth 2 % PADS 6 each (has no administration in time range)  pentafluoroprop-tetrafluoroeth (GEBAUERS) aerosol 1 application (has no administration in time range)  lidocaine (PF) (XYLOCAINE) 1 % injection 5 mL (has no administration in time range)  lidocaine-prilocaine (EMLA) cream 1 application (has no administration in time range)  0.9 %  sodium chloride infusion (has no administration in time range)  0.9 %  sodium chloride infusion (has no administration in time range)  heparin injection 1,000 Units (has no administration in time range)  alteplase (CATHFLO ACTIVASE) injection 2 mg (has no administration in time range)  vancomycin variable dose per unstable renal function (pharmacist dosing)  (has no administration in time range)  vancomycin (VANCOREADY) IVPB 1500 mg/300 mL (0 mg Intravenous Stopped 12/22/20 1034)  ceFEPIme (MAXIPIME) 2 g in sodium chloride 0.9 % 100 mL IVPB (0 g Intravenous Stopped 12/22/20 0806)  lactated ringers bolus 500 mL (0 mLs Intravenous Stopped 12/22/20 0806)  acetaminophen (TYLENOL) tablet 1,000 mg (1,000 mg Oral Given 12/22/20 1251)  gadobutrol (GADAVIST) 1 MMOL/ML injection 7.5 mL (7.5 mLs Intravenous Contrast Given 12/22/20 1207)    ED Course  I have reviewed the triage vital signs and the nursing notes.  Pertinent labs & imaging results that were available during my care of the patient were reviewed by me and considered in my medical decision making (see chart for details).   With consideration of sepsis versus other phenomena for altered mental status patient had labs, x-ray, CT ordered.  With vital signs notable for SIRS criteria patient started antibiotics as well with presumed pneumonia given his reported hypoxia of 74% on room air. On 2 L nasal cannula patient had 95 percent saturation, abnormal Cardiac 115 sinus tach abnormal   2:22 PM Have reviewed his MRI, no indication of osteomyelitis/discitis.  Remaining labs similarly reassuring, though his initial leukocytosis of 20,000, fever, tachycardia, all consistent with sepsis.  Some suspicion for pneumonia given the patient's abnormal x-ray, hypoxia, fever. I discussed his case with our nephrology colleagues, and now, following contrast for MRI, will require dialysis today. Adult male with multiple medical  issues including end-stage renal disease, recent endocarditis, presents with fever, tachycardia, hypoxia, meets initial sepsis criteria.  Patient does have mild hypotension, did receive fluids, though not in abundant quantities given the patient's pulmonary edema on x-ray.  Patient's blood pressure remained relatively stable, MAP greater than 65, and after mild resuscitation patient's fever resolved,  tachycardia resolved, respiratory rate improved, but given his concern for sepsis and admitted for dialysis, ongoing monitoring, management. MDM Rules/Calculators/A&P MDM Number of Diagnoses or Management Options Severe sepsis ([place]): new, needed workup   Amount and/or Complexity of Data Reviewed Clinical lab tests: ordered and reviewed Tests in the radiology section of CPT: ordered and reviewed Tests in the medicine section of CPT: ordered and reviewed Decide to obtain previous medical records or to obtain history from someone other than the patient: yes Obtain history from someone other than the patient: yes Review and summarize past medical records: yes Discuss the patient with other providers: yes Independent visualization of images, tracings, or specimens: yes  Risk of Complications, Morbidity, and/or Mortality Presenting problems: high Diagnostic procedures: high Management options: high  Critical Care Total time providing critical care: 30-74 minutes (45)  Patient Progress Patient progress: improved   Final Clinical Impression(s) / ED Diagnoses Final diagnoses:  Severe sepsis ([place])     Carmin Muskrat, MD 12/22/20 1425

## 2020-12-22 NOTE — Progress Notes (Signed)
PHARMACY - PHYSICIAN COMMUNICATION CRITICAL VALUE ALERT - BLOOD CULTURE IDENTIFICATION (BCID)  Patrick Anthony is an 50 y.o. male who presented to [facility] on 12/22/2020 with a chief complaint of confusion.  Assessment:  Recently treated with MRSA bacteremia, septic emboli to brain, and mitral valve endocarditis as well as osteomyelitis and abscess R tibia status post a right AKA on 10/29/20. Blood cultures are now 4 out of 4 positive and BCID is showing Staphylococcus Aureus with methicillin resistance detected >> MRSA.   Name of physician (or Provider) Contacted: Dr. Tonie Griffith  Current antibiotics: Vancomycin and Cefepime  Changes to prescribed antibiotics recommended:  Recommendations declined by provider due to multiple possible sources of infection - Concern for possible pneumonia with small pleural effusion and rales. Keeping Cefepime tonight. Follow-up CXR in AM.   Results for orders placed or performed during the hospital encounter of 09/27/20  Blood Culture ID Panel (Reflexed) (Collected: 09/27/2020  2:16 PM)  Result Value Ref Range   Enterococcus faecalis NOT DETECTED NOT DETECTED   Enterococcus Faecium NOT DETECTED NOT DETECTED   Listeria monocytogenes NOT DETECTED NOT DETECTED   Staphylococcus species DETECTED (A) NOT DETECTED   Staphylococcus aureus (BCID) DETECTED (A) NOT DETECTED   Staphylococcus epidermidis NOT DETECTED NOT DETECTED   Staphylococcus lugdunensis NOT DETECTED NOT DETECTED   Streptococcus species NOT DETECTED NOT DETECTED   Streptococcus agalactiae NOT DETECTED NOT DETECTED   Streptococcus pneumoniae NOT DETECTED NOT DETECTED   Streptococcus pyogenes NOT DETECTED NOT DETECTED   A.calcoaceticus-baumannii NOT DETECTED NOT DETECTED   Bacteroides fragilis NOT DETECTED NOT DETECTED   Enterobacterales NOT DETECTED NOT DETECTED   Enterobacter cloacae complex NOT DETECTED NOT DETECTED   Escherichia coli NOT DETECTED NOT DETECTED   Klebsiella aerogenes NOT DETECTED  NOT DETECTED   Klebsiella oxytoca NOT DETECTED NOT DETECTED   Klebsiella pneumoniae NOT DETECTED NOT DETECTED   Proteus species NOT DETECTED NOT DETECTED   Salmonella species NOT DETECTED NOT DETECTED   Serratia marcescens NOT DETECTED NOT DETECTED   Haemophilus influenzae NOT DETECTED NOT DETECTED   Neisseria meningitidis NOT DETECTED NOT DETECTED   Pseudomonas aeruginosa NOT DETECTED NOT DETECTED   Stenotrophomonas maltophilia NOT DETECTED NOT DETECTED   Candida albicans NOT DETECTED NOT DETECTED   Candida auris NOT DETECTED NOT DETECTED   Candida glabrata NOT DETECTED NOT DETECTED   Candida krusei NOT DETECTED NOT DETECTED   Candida parapsilosis NOT DETECTED NOT DETECTED   Candida tropicalis NOT DETECTED NOT DETECTED   Cryptococcus neoformans/gattii NOT DETECTED NOT DETECTED   Meth resistant mecA/C and MREJ DETECTED (A) NOT DETECTED    Sloan Leiter, PharmD, BCPS, BCCCP Clinical Pharmacist Please refer to [facility] for [place] numbers 12/22/2020  8:28 PM

## 2020-12-22 NOTE — H&P (Signed)
History and Physical    Patrick Anthony [id number] DOB: [birth year] DOA: 12/22/2020  Referring MD/NP/PA: Carmin Muskrat, MD PCP: [name], MD  Patient coming from: [facility]  Chief Complaint: Fever and altered mental status  I have personally briefly reviewed patient's old medical records in [place]   HPI: Patrick Anthony is a 50 y.o. 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  History is difficult to obtain from the patient as he seems to forget or possibly be unable to focus on questions and repeats himself.  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  Patient underwent bioprosthetic mitral valve replacement along with closure of the PFO on 3/10. 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 Sharol Given.  Patient was able to be discharged to [facility] for continuation of vancomycin at the nursing facility through 4/21.  Records note patient was normally reported to to be alert and oriented x4.  At this time patient only appears oriented to person and place, but not to current situation.  He complains of severe pain in his lower back that has been present for quite some time and sharp shooting pains in his right leg that was amputated.  He states that the rehab place that they sent him is horrible and he does not want to go back.  Denies having any cough, shortness of breath, chest pain, nausea, vomiting, or wounds.    In route with EMS patient was reported to have fever up to 103 F with rales reported in the right lower  lung field  ED Course: Upon admission into the emergency department patient was seen to be febrile up to 103 F, pulse 80-1 19, respirations 14-24, blood pressure 84/51-111/75, and O2 saturations 89-100% on 2 L of nasal cannula oxygen.  CT scan of the brain showed no acute abnormalities and subtle changes correlating areas of infarct noted on previous MRI.  Lab significant for WBC 20.2, hemoglobin 9.4, sodium 133, BUN 56, creatinine 8.2, glucose 82, anion gap 16, lactic acid 1.7, albumin 2.4, and total protein 8.4.  Chest x-ray showed scarring of the right upper lobe, middle right lobe atelectasis, small left-sided pleural effusion, and stable cardiomegaly.  Sepsis protocol has been initiated with fluid bolus.  Patient was given acetaminophen 1000 mg p.o., vancomycin and cefepime.  Review of Systems  Unable to perform ROS: Mental status change  Musculoskeletal: Positive for back pain and myalgias.    Past Medical History:  Diagnosis Date  . 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 Chronic kidney disease    patient on dialysis tues-thurs-sat  . Cirrhosis of liver ([place])   . Drug addiction ([place])   . ETOH abuse   . Hepatitis   . Hypertension   . Mitral regurgitation   . Patent foramen ovale 10/23/2019  . Renal disorder renal failure  . S/P minimally-invasive mitral valve replacement with bioprosthetic valve 10/14/2020   31 mm Medtronic Mosaic stented porcine bioprosthetic  tissue valve via right mini thoracotomy approach  . Sleep apnea   . Systolic and diastolic CHF, 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[place]) 04/19/2013    Past Surgical History:  Procedure Laterality Date  . AMPUTATION Right 10/29/2020   Procedure: RIGHT ABOVE KNEE AMPUTATION;  Surgeon: [name], MD;  Location: [place];  Service: Orthopedics;  Laterality: Right;  . AV FISTULA PLACEMENT Right   . AV FISTULA PLACEMENT Left 09/03/2013   Procedure:  ARTERIOVENOUS (AV) FISTULA CREATION - LEFT BRACHIOCEPHALIC;  Surgeon: Conrad , MD;  Location[facility];  Service: Vascular;  Laterality: Left;  . AV FISTULA PLACEMENT Left 10/30/2019   Procedure: INSERTION OF ARTERIOVENOUS (AV) GORE-TEX GRAFT ARM;  Surgeon: Rosetta Posner, MD;  Location: [place];  Service: Vascular;  Laterality: Left;  . BUBBLE STUDY  10/05/2020   Procedure: BUBBLE STUDY;  Surgeon: Pixie Casino, MD;  Location: [place];  Service: Cardiovascular;;  . I & D EXTREMITY Right 10/20/2020   Procedure: IRRIGATION AND DEBRIDEMENT OF LEG;  Surgeon: [name], MD;  Location: [place];  Service: Orthopedics;  Laterality: Right;  . INSERTION OF DIALYSIS CATHETER N/A 08/29/2013   Procedure: INSERTION OF DIALYSIS CATHETER;  Surgeon: Mal Misty, MD;  Location: [place];  Service: Vascular;  Laterality: N/A;  . INSERTION OF DIALYSIS CATHETER Left 10/30/2019   Procedure: INSERTION OF DIALYSIS CATHETER LEFT INTERNAL JUGULAR;  Surgeon: Rosetta Posner, MD;  Location: [place];  Service: Vascular;  Laterality: Left;  . LEFT HEART CATH AND CORONARY ANGIOGRAPHY N/A 10/07/2020   Procedure: LEFT HEART CATH AND CORONARY ANGIOGRAPHY;  Surgeon: Lorretta Harp, MD;  Location: [place] [facility];  Service: Cardiovascular;  Laterality: N/A;  . MITRAL VALVE REPAIR Right 10/14/2020   Procedure: MINIMALLY INVASIVE MITRAL VALVE  REPLACEMENT(MVR) USING MEDTRONIC MOSAIC VALVE SIZE 31MM;  Surgeon: Rexene Alberts, MD;  Location: [place];  Service: Open Heart Surgery;  Laterality: Right;  . MULTIPLE EXTRACTIONS WITH ALVEOLOPLASTY N/A 10/12/2020   Procedure: MULTIPLE EXTRACTION WITH ALVEOLOPLASTY;  Surgeon: [name], DMD;  Location: [place];  Service: Dentistry;  Laterality: N/A;  . PERIPHERAL VASCULAR CATHETERIZATION N/A 01/06/2015   Procedure: A/V Shuntogram/Fistulagram;  Surgeon: Katha Cabal, MD;  Location: [place] [facility];  Service: Cardiovascular;  Laterality: N/A;  . PERIPHERAL VASCULAR CATHETERIZATION  Left 01/06/2015   Procedure: A/V Shunt Intervention;  Surgeon: Katha Cabal, MD;  Location: [place] [facility];  Service: Cardiovascular;  Laterality: Left;  . REPAIR OF PATENT FORAMEN OVALE N/A 10/14/2020   Procedure: CLOSURE OF PATENT FORAMEN OVALE;  Surgeon: Rexene Alberts, MD;  Location: [place];  Service: Open Heart Surgery;  Laterality: N/A;  . TEE WITHOUT CARDIOVERSION N/A 10/05/2020   Procedure: TRANSESOPHAGEAL ECHOCARDIOGRAM (TEE);  Surgeon: Pixie Casino, MD;  Location: [place];  Service: Cardiovascular;  Laterality: N/A;  . TEE WITHOUT CARDIOVERSION N/A 10/14/2020   Procedure: TRANSESOPHAGEAL ECHOCARDIOGRAM (TEE);  Surgeon: Rexene Alberts, MD;  Location: [place];  Service: Open Heart Surgery;  Laterality: N/A;     reports that he quit smoking about 3 years ago. His smoking use included cigarettes. He smoked 0.20 packs per day. He has never used smokeless tobacco. He reports that he does not drink alcohol and does not use drugs.  No Known Allergies  Family History  Problem Relation Age of Onset  . Hypertension Mother   . Cancer Mother        breast    Prior to Admission  medications   Medication Sig Start Date End Date Taking? Authorizing Provider  acetaminophen (TYLENOL) 325 MG tablet Take 650 mg by mouth every 6 (six) hours as needed for mild pain. Temp > 100.5   Yes [provider]  aspirin EC 325 MG EC tablet Take 1 tablet (325 mg total) by mouth daily. 11/10/20  Yes Samella Parr, NP  bisacodyl (DULCOLAX) 10 MG suppository Place 1 suppository (10 mg total) rectally daily as needed for moderate constipation. 11/09/20  Yes Samella Parr, NP  calcium acetate (PHOSLO) 667 MG capsule Take 1 capsule (667 mg total) by mouth 3 (three) times daily with meals. 11/09/20  Yes Samella Parr, NP  cinacalcet (SENSIPAR) 30 MG tablet Take 2 tablets (60 mg total) by mouth daily with breakfast. 11/10/20  Yes Samella Parr, NP  cloNIDine (CATAPRES) 0.2 MG tablet Take 0.2  mg by mouth 2 (two) times daily.   Yes [provider]  docusate sodium (COLACE) 100 MG capsule Take 1 capsule (100 mg total) by mouth 2 (two) times daily. 11/09/20  Yes Samella Parr, NP  HYDROmorphone (DILAUDID) 2 MG tablet Take 1 tablet (2 mg total) by mouth every 4 (four) hours as needed for severe pain (Not controlled by oxycontin). 11/09/20  Yes Samella Parr, NP  methocarbamol (ROBAXIN) 500 MG tablet Take 1 tablet (500 mg total) by mouth 3 (three) times daily. 11/09/20  Yes Samella Parr, NP  metoprolol tartrate (LOPRESSOR) 50 MG tablet Take 1 tablet (50 mg total) by mouth 2 (two) times daily. 11/09/20  Yes Samella Parr, NP  Multiple Vitamin (MULTIVITAMIN WITH MINERALS) TABS tablet Take 1 tablet by mouth daily.   Yes [provider]  nitroGLYCERIN (NITROSTAT) 0.4 MG SL tablet Place 0.4 mg under the tongue every 5 (five) minutes x 3 doses as needed for chest pain.    Yes [provider]  Nutritional Supplements (,FEEDING SUPPLEMENT, PROSOURCE PLUS) liquid Take 30 mLs by mouth 2 (two) times daily between meals. 11/09/20  Yes Samella Parr, NP  Nutritional Supplements (FEEDING SUPPLEMENT, NEPRO CARB STEADY,) LIQD Take 237 mLs by mouth 3 (three) times daily between meals. 11/09/20  Yes Samella Parr, NP  oxyCODONE (OXYCONTIN) 15 mg 12 hr tablet Take 1 tablet (15 mg total) by mouth every 12 (twelve) hours. 11/09/20  Yes Samella Parr, NP  pantoprazole (PROTONIX) 40 MG tablet Take 1 tablet (40 mg total) by mouth daily. 11/10/20  Yes Samella Parr, NP  polyethylene glycol (MIRALAX / GLYCOLAX) 17 g packet Take 17 g by mouth daily as needed for mild constipation. 11/09/20  Yes Samella Parr, NP  pregabalin (LYRICA) 75 MG capsule Take 1 capsule (75 mg total) by mouth at bedtime. 11/09/20  Yes Samella Parr, NP  senna-docusate (SENOKOT-S) 8.6-50 MG tablet Take 1 tablet by mouth 2 (two) times daily. 11/09/20  Yes Samella Parr, NP  sevelamer carbonate (RENVELA) 800 MG  tablet Take 1,600 mg by mouth 3 (three) times daily with meals.   Yes [provider]  vancomycin 750 mg in sodium chloride 0.9 % 150 mL Inject 750 mg into the vein every Monday, Wednesday, and Friday with hemodialysis.   Yes [provider]  Darbepoetin Alfa (ARANESP, ALBUMIN FREE, IJ) Darbepoetin Alfa (Aranesp) 07/05/20 07/04/21  [provider]    Physical Exam:  Constitutional: Middle-age male who appears to be in some discomfort Vitals:   12/22/20 1522 12/22/20 1530 12/22/20 1600 12/22/20 1630  BP: (!) 88/57 103/76 94/63 107/69  Pulse: 82 84 83 86  Resp: 19 19 (!) 21 (!) 24  Temp:      TempSrc:      SpO2: 100% 99% 98% 96%  Height:       Eyes: PERRL, lids and conjunctivae normal ENMT: Mucous membranes are moist. Posterior pharynx clear of any exudate or lesions.  Neck: normal, supple, no masses, no thyromegaly Respiratory: Some rales with possibly some rhonchi noted of the lower lung fields.  Patient currently on 2 L of nasal cannula O2 saturations maintained. Cardiovascular: Regular rate and rhythm, no murmurs / rubs / gallops. No extremity edema. 2+ pedal pulses.  Left upper AV fistula with palpable thrill. Abdomen: no tenderness, no masses palpated. No hepatosplenomegaly. Bowel sounds positive.  Musculoskeletal: no clubbing / cyanosis.  Right AKA stump. Skin: no rashes or open wounds appreciate. No induration Neurologic: CN 2-12 grossly intact. Strength 5/5 in all 4.  Psychiatric: Poor memory.  Patient is alert and oriented at least to person and place    Labs on Admission: I have personally reviewed following labs and imaging studies  CBC: Recent Labs  Lab 12/22/20 0714  WBC 20.2*  NEUTROABS 18.0*  HGB 9.4*  HCT 32.0*  MCV 82.5  PLT 123456   Basic Metabolic Panel: Recent Labs  Lab 12/22/20 0714  NA 133*  K 4.4  CL 92*  CO2 25  GLUCOSE 82  BUN 56*  CREATININE 8.20*  CALCIUM 8.8*   GFR: Estimated Creatinine Clearance: 12 mL/min  (A) (by C-G formula based on SCr of 8.2 mg/dL (H)). Liver Function Tests: Recent Labs  Lab 12/22/20 0714  AST 20  ALT 10  ALKPHOS 74  BILITOT 1.0  PROT 8.4*  ALBUMIN 2.4*   No results for input(s): LIPASE, AMYLASE in the last 168 hours. No results for input(s): AMMONIA in the last 168 hours. Coagulation Profile: Recent Labs  Lab 12/22/20 0714  INR 1.3*   Cardiac Enzymes: No results for input(s): CKTOTAL, CKMB, CKMBINDEX, TROPONINI in the last 168 hours. BNP (last 3 results) No results for input(s): PROBNP in the last 8760 hours. HbA1C: No results for input(s): HGBA1C in the last 72 hours. CBG: Recent Labs  Lab 12/22/20 1402  GLUCAP 83   Lipid Profile: No results for input(s): CHOL, HDL, LDLCALC, TRIG, CHOLHDL, LDLDIRECT in the last 72 hours. Thyroid Function Tests: No results for input(s): TSH, T4TOTAL, FREET4, T3FREE, THYROIDAB in the last 72 hours. Anemia Panel: No results for input(s): VITAMINB12, FOLATE, FERRITIN, TIBC, IRON, RETICCTPCT in the last 72 hours. Urine analysis:    Component Value Date/Time   COLORURINE YELLOW 08/28/2013 1426   APPEARANCEUR HAZY (A) 08/28/2013 1426   LABSPEC 1.017 08/28/2013 1426   PHURINE 6.0 08/28/2013 1426   GLUCOSEU 100 (A) 08/28/2013 1426   HGBUR SMALL (A) 08/28/2013 1426   BILIRUBINUR NEGATIVE 08/28/2013 1426   KETONESUR NEGATIVE 08/28/2013 1426   PROTEINUR >300 (A) 08/28/2013 1426   UROBILINOGEN 0.2 08/28/2013 1426   NITRITE NEGATIVE 08/28/2013 1426   LEUKOCYTESUR SMALL (A) 08/28/2013 1426   Sepsis Labs: Recent Results (from the past 240 hour(s))  Resp Panel by RT-PCR (Flu A&B, Covid) Nasopharyngeal Swab     Status: None   Collection Time: 12/22/20  7:14 AM   Specimen: Nasopharyngeal Swab; Nasopharyngeal(NP) swabs in vial transport medium  Result Value Ref Range Status   SARS Coronavirus 2 by RT PCR NEGATIVE NEGATIVE Final    Comment: (NOTE) SARS-CoV-2 target nucleic acids are NOT  DETECTED.  The SARS-CoV-2 RNA is  generally detectable in upper respiratory specimens during the acute phase of infection. The lowest concentration of SARS-CoV-2 viral copies this assay can detect is 138 copies/mL. A negative result does not preclude SARS-Cov-2 infection and should not be used as the sole basis for treatment or other patient management decisions. A negative result may occur with  improper specimen collection/handling, submission of specimen other than nasopharyngeal swab, presence of viral mutation(s) within the areas targeted by this assay, and inadequate number of viral copies(<138 copies/mL). A negative result must be combined with clinical observations, patient history, and epidemiological information. The expected result is Negative.  Fact Sheet for Patients:  EntrepreneurPulse.com.au  Fact Sheet for Healthcare Providers:  IncredibleEmployment.be  This test is no t yet approved or cleared by the [place] FDA and  has been authorized for detection and/or diagnosis of SARS-CoV-2 by FDA under an Emergency Use Authorization (EUA). This EUA will remain  in effect (meaning this test can be used) for the duration of the COVID-19 declaration under Section 564(b)(1) of the Act, 21 U.S.C.section 360bbb-3(b)(1), unless the authorization is terminated  or revoked sooner.       Influenza A by PCR NEGATIVE NEGATIVE Final   Influenza B by PCR NEGATIVE NEGATIVE Final    Comment: (NOTE) The Xpert Xpress SARS-CoV-2/FLU/RSV plus assay is intended as an aid in the diagnosis of influenza from Nasopharyngeal swab specimens and should not be used as a sole basis for treatment. Nasal washings and aspirates are unacceptable for Xpert Xpress SARS-CoV-2/FLU/RSV testing.  Fact Sheet for Patients: EntrepreneurPulse.com.au  Fact Sheet for Healthcare Providers: IncredibleEmployment.be  This test is not yet approved or cleared by the [place] FDA and has been authorized for detection and/or diagnosis of SARS-CoV-2 by FDA under an Emergency Use Authorization (EUA). This EUA will remain in effect (meaning this test can be used) for the duration of the COVID-19 declaration under Section 564(b)(1) of the Act, 21 U.S.C. section 360bbb-3(b)(1), unless the authorization is terminated or revoked.  Performed at [facility], [place] [address], [place], [place] [address]      Radiological Exams on Admission: DG Chest Port 1 View  Result Date: 12/22/2020 CLINICAL DATA:  50 year old male with possible sepsis. Dialysis patient. Weakness. EXAM: PORTABLE CHEST 1 VIEW COMPARISON:  Chest radiographs 12/13/2020 and earlier. FINDINGS: Portable AP semi upright view at 0734 hours. Lower lung volumes. Stable cardiomegaly and mediastinal contours. Visualized tracheal air column is within normal limits. No pneumothorax. Chronic architectural distortion in the right lung with curvilinear opacity is stable. Mild hypo ventilation and atelectasis now at both lung bases. No consolidation. No pleural effusion is evident. Stable visualized osseous structures. Abundant chronic dystrophic calcification about the visible right shoulder. Chronic left subclavian vascular stent. Paucity of bowel gas in the upper abdomen. IMPRESSION: 1. Lower lung volumes with atelectasis. 2. Chronic cardiomegaly and right lung scarring. Electronically Signed   By: Genevie Ann M.D.   On: 12/22/2020 07:50    EKG: Independently reviewed.  Sinus tachycardia 112 bpm with QTC 492  Assessment/Plan SIRS/Sepsis, unknown source: Acute.  Patient presented with fever up to 103 F with tachycardia and tachypnea.  White blood cell count was elevated up to 20.2, but lactic acid was reassuring at 1.7. Chest x-ray only noted low lung volumes with some possible signs of atelectasis.  At this time no clear source of infection appreciated.. -Admit to a progressive bed -Follow-up blood  culture -Continue empiric antibiotics of  vancomycin and cefepime -May want to discuss case with ID in a.m.  Hypotension: Acute.  On admission blood pressures as low as 84/51.  Blood pressures improved with IV fluids.  Home blood pressure medications include clonidine 0.2 mg twice daily and metoprolol 50 mg twice daily. -Hold home blood pressure medications -Goal MAP at least 65 -IV fluids as needed  Acute metabolic encephalopathy: Patient currently noted to be alert and oriented x2, but was previously noted to be alert and oriented x4.  Initial CT scan of the brain did not note any acute abnormalities.  Prior history mitral valve endocarditis with septic emboli to the brain. -Neurochecks -May warrant further investigation as patient does not appear to return to his baseline  History of MRSA bacteremia mitral valve endocarditis with PFO with septic emboli to the brain and left lung.  Patient appears to have completed antibiotics on 4/21.  ESRD on HD: Patient history of polycystic kidney disease.  Normally dialyzes Monday, Wednesday, Friday.  HD planned following with contrast.   -Appreciate nephrology consultative services , HD per nephrology   Anemia of chronic disease: Hemoglobin 9.4 g/dL which appears near patient's baseline. -Continue to monitor  Chronic pain with chronic opiate dependence: Patient reports chronic back pain as well as right lower extremity at was recently amputated. -Continue regimen of OxyContin and hydromorphone as needed  Status post AKA -Continue Lyrica  Liver cirrhosis secondary to chronic hepatitis C: HCV quantitative 1,750,000 on 2/23. -Will need outpatient follow-up with ID  Hyponatremia: Chronic.  Sodium level 133 on admission. -Per nephrology  GERD - continue Protonix  DVT prophylaxis: Heparin Code Status: Full Family Communication: none Disposition Plan: Likely need to discharge back to skilled nursing facility once medically Consults called:  Nephrology Admission status: Inpatient, likely require more than 2 midnight stay due to need of IV antibiotic  Norval Morton MD [facility]   If 7PM-7AM, please contact night-coverage   12/22/2020, 5:04 PM

## 2020-12-22 NOTE — ED Triage Notes (Signed)
Pt BIB EMS from [facility]. EMS reports that pt is normally AOx4, can have a conversation. Pt has been spiking fever the last 3 days and [place] reports pt is not 100% himself. EMS fever of 103. EMS reports pt is tachy at 114, RR irregular - O2 will drop down when RR are lower and O2 will pick back up when pt is tachypnic. EMS reports rales in R lower lobes. PT gets dialysis on MWF - reportedly has access on both arms - obvious on left - but pt reported to EMS that if they need to get blood to get in right side.  Pt reports no pain at this time, is AOx2 (person and place) 20 RW

## 2020-12-22 NOTE — ED Notes (Signed)
Patient transported to CT 

## 2020-12-22 NOTE — Progress Notes (Addendum)
Pharmacy Antibiotic Note  Patrick Anthony is a 50 y.o. male admitted on 12/22/2020 with pneumonia.  Pharmacy has been consulted for cefepime and vancomycin dosing.  From [place] - has been having fevers and confusion. Known HD patient - usually MWF. WBC 20.2, temp 103.  Plan: Vancomycin 1500 mg IV once then 750 mg IV qHD Cefepime 2g IV once then 2g IV qHD Monitor HD schedule, cx results, clinical pic, LOT     Temp (24hrs), Avg:102.7 F (39.3 C), Min:102.4 F (39.1 C), Max:103 F (39.4 C)  Recent Labs  Lab 12/22/20 0714  WBC 20.2*  CREATININE 8.20*    Estimated Creatinine Clearance: 12 mL/min (A) (by C-G formula based on SCr of 8.2 mg/dL (H)).    No Known Allergies  Antimicrobials this admission: Vancomycin 5/18 >>  Cefepime 5/18 >>   Dose adjustments this admission: N/A  Microbiology results: 5/18 BCx: sent 5/18 UCx: sent  5/18 COVID PCR: neg  Thank you for allowing pharmacy to be a part of this patient's care.  Antonietta Jewel, PharmD, [place] Clinical Pharmacist  Phone: [phone] 12/22/2020 8:43 AM  Please check AMION for all [place] phone numbers After 10:00 PM, call [place] [phone]   ADDENDUM Per [facility], patient is on vancomycin 750 mg qHD MWF (LD on 5/16) for MRSA bacterial endocarditis and tibial OM still. Received loading dose of 1500 mg IV once today at 0804 prior to realizing on PTA. Will adjust vancomycin plan to HOLD today dose with HD and then get level prior to HD session on 5/20.   Antonietta Jewel, PharmD, [place] Clinical Pharmacist  Phone: [phone] 12/22/2020 12:00 PM  Please check AMION for all [place] phone numbers After 10:00 PM, call [place] [phone]

## 2020-12-22 NOTE — Progress Notes (Signed)
Pt given MR contrast per exam order. HD for today 12/22/20 per Dr. Jonnie Finner in Nephrology.

## 2020-12-22 NOTE — ED Notes (Signed)
Pt in imaging at this time.

## 2020-12-23 DIAGNOSIS — B9561 Methicillin susceptible Staphylococcus aureus infection as the cause of diseases classified elsewhere: Secondary | ICD-10-CM

## 2020-12-23 DIAGNOSIS — N186 End stage renal disease: Secondary | ICD-10-CM

## 2020-12-23 DIAGNOSIS — Z89611 Acquired absence of right leg above knee: Secondary | ICD-10-CM | POA: Diagnosis not present

## 2020-12-23 DIAGNOSIS — A419 Sepsis, unspecified organism: Secondary | ICD-10-CM

## 2020-12-23 DIAGNOSIS — R7881 Bacteremia: Secondary | ICD-10-CM

## 2020-12-23 DIAGNOSIS — B9562 Methicillin resistant Staphylococcus aureus infection as the cause of diseases classified elsewhere: Secondary | ICD-10-CM

## 2020-12-23 DIAGNOSIS — Z952 Presence of prosthetic heart valve: Secondary | ICD-10-CM | POA: Diagnosis not present

## 2020-12-23 DIAGNOSIS — Z992 Dependence on renal dialysis: Secondary | ICD-10-CM

## 2020-12-23 DIAGNOSIS — R6520 Severe sepsis without septic shock: Secondary | ICD-10-CM

## 2020-12-23 DIAGNOSIS — B182 Chronic viral hepatitis C: Secondary | ICD-10-CM

## 2020-12-23 LAB — RENAL FUNCTION PANEL
Albumin: 2.2 g/dL — ABNORMAL LOW (ref 3.5–5.0)
Anion gap: 15 (ref 5–15)
BUN: 29 mg/dL — ABNORMAL HIGH (ref 6–20)
CO2: 25 mmol/L (ref 22–32)
Calcium: 8.4 mg/dL — ABNORMAL LOW (ref 8.9–10.3)
Chloride: 95 mmol/L — ABNORMAL LOW (ref 98–111)
Creatinine, Ser: 4.91 mg/dL — ABNORMAL HIGH (ref 0.61–1.24)
GFR, Estimated: 14 mL/min — ABNORMAL LOW (ref >60)
Glucose, Bld: 66 mg/dL — ABNORMAL LOW (ref 70–99)
Phosphorus: 1.8 mg/dL — ABNORMAL LOW (ref 2.5–4.6)
Potassium: 3.4 mmol/L — ABNORMAL LOW (ref 3.5–5.1)
Sodium: 135 mmol/L (ref 135–145)

## 2020-12-23 LAB — CBC
HCT: 31 % — ABNORMAL LOW (ref 39.0–52.0)
Hemoglobin: 9.3 g/dL — ABNORMAL LOW (ref 13.0–17.0)
MCH: 24.2 pg — ABNORMAL LOW (ref 26.0–34.0)
MCHC: 30 g/dL (ref 30.0–36.0)
MCV: 80.7 fL (ref 80.0–100.0)
Platelets: 228 10*3/uL (ref 150–400)
RBC: 3.84 MIL/uL — ABNORMAL LOW (ref 4.22–5.81)
RDW: 19.9 % — ABNORMAL HIGH (ref 11.5–15.5)
WBC: 19.7 10*3/uL — ABNORMAL HIGH (ref 4.0–10.5)
nRBC: 0 % (ref 0.0–0.2)

## 2020-12-23 LAB — GLUCOSE, CAPILLARY: Glucose-Capillary: 82 mg/dL (ref 70–99)

## 2020-12-23 MED ORDER — CHLORHEXIDINE GLUCONATE CLOTH 2 % EX PADS
6.0000 | MEDICATED_PAD | Freq: Every day | CUTANEOUS | Status: DC
  Administered 2020-12-24 – 2020-12-26 (×3): 6 via TOPICAL

## 2020-12-23 MED ORDER — SODIUM CHLORIDE 0.9 % IV BOLUS
500.0000 mL | Freq: Once | INTRAVENOUS | Status: AC
  Administered 2020-12-23: 500 mL via INTRAVENOUS

## 2020-12-23 MED ORDER — CINACALCET HCL 30 MG PO TABS
60.0000 mg | ORAL_TABLET | Freq: Every day | ORAL | Status: DC
  Administered 2020-12-24 – 2021-01-06 (×12): 60 mg via ORAL
  Filled 2020-12-23 (×14): qty 2

## 2020-12-23 NOTE — Progress Notes (Signed)
PROGRESS NOTE    Khrystian Schmick  [id number] DOB: June 18, 1971 DOA: 12/22/2020 PCP: [name], MD   Brief Narrative:  The patient is a 71 atrial African-American male with a past medical history significant for but not limited to end-stage renal disease on hemodialysis on Monday Wednesday Friday, chronic diastolic CHF, hyperlipidemia, history of liver cirrhosis secondary to hepatitis C, history of alcohol abuse, OSA as well as recent AKA done by Dr. Sharol Given on 10/29/2020 and other comorbidities who presented with a chief complaint of spiking fevers for last few days and then altered mental status.  Reportedly patient was at his facility when they were noticing that he was altered and history is difficult to be obtained from the patient himself.  Of note he has had a prolonged hospitalization from 09/27/20 until November 11, 2019 he was found to have MRSA bacteremia as well as mitral valve endocarditis with PFO and septic emboli to the brain along with that time.  He underwent a bioprosthetic mitral valve replacement along with closure of the PFO on 10/14/2020.  Also during that hospitalization he was treated for osteomyelitis and abscess of the right tibia status post AKA on 10/29/2020 by Dr. Sharol Given.  He was discharged to [facility] for continuation of vancomycin at the nursing facility until 11/25/2020.  Reportedly he is usually alert and oriented x4.  Yesterday he is only alert and oriented to person and place but not the situation.  He complained about severe pain in his back has been present for some time and he describes shooting pains in the right leg but was amputated.  He denies any shortness breath or cough.  In the ED he presented with a fever of 103 and rales were reported in the lower lung field on the right lung.  Upon arrival to the ED he was noted to be septic given that he had an elevated temperature, respirations as well as being tachycardia and had a new O2 requirement as well as being altered.   CT scan of the brain showed no acute abnormalities but did show several changes that were chronic injury to the areas of infarct noted on the previous MRI.  His WBC was elevated 20,200.  Chest x-ray showed scarring in the right upper lobe, middle lobe atelectasis and a small left-sided pleural effusion stable cardiomegaly.  Initial sepsis protocol was initiated and he was given IV vancomycin and cefepime.  Further work-up reveals that he has another MRSA bacteremia and infectious diseases is involved again and nephrology is involved as well.  Patient is to get a repeat TEE on Monday 12/27/2020 to evaluate his mitral valve and also get a MRI the T-spine with and without contrast with hemodialysis after.  Assessment & Plan:   Principal Problem:   Bacteremia due to Staphylococcus aureus Active Problems:   Anemia in chronic kidney disease   Hepatitis C   ESRD on dialysis (HCC)   S/P mitral valve replacement   S/P AKA (above knee amputation), right (HCC)   Personal history of Methicillin resistant Staphylococcus aureus infection   SIRS (systemic inflammatory response syndrome) (HCC)  Severe sepsis secondary to an MRSA bacteremia, present on admission -Presented the fever up to 103 F, tachycardia, tachypnea, a new oxygen requirement, altered mental status as well as a leukocytosis of 20,200 and also with hypotension -Lactic acid level was reassuring at 1.7 on admission -Chest x-ray on admission only noted low lung volumes and possible signs of atelectasis with no clear source of infection appreciated  at that time -Blood cultures now revealing an MRSA bacteremia -He has been admitted progressive bed -He was initiated on IV antibiotics with vancomycin and cefepime but cefepime has now been discontinued -Infectious diseases has been consulted and plan further work-up for the patient and have ordered a repeat TEE and discussed with cardiology given his prostatic mitral valve and also will be getting a  T-spine MRI with and without contrast with dialysis to be done afterwards -We will be continuing vancomycin -Repeat blood cultures have been planned for 12/24/2020 -Appreciate further ID evaluation recommendations; sepsis physiology has improved -Patient's WBC minimally improved and went from 20.2 and is now 90% on  Hypotension -In the setting of MRSA bacteremia his admission blood pressures were as low as 84/51 -Improved with IV fluid resuscitation -Continue to monitor blood pressures per protocol; blood pressures are still on the softer side but last blood pressure reading was 117/89 -His home blood pressure medications include clonidine 0.2 mg p.o. twice daily as well as metoprolol tartrate 50 mg p.o. twice daily which have both been held -Goal MAP bolus of at least 65 -Continue with IV fluid hydration as needed but will need to be judicious given that he is a dialysis patient  Acute metabolic encephalopathy in the setting of infection -He is noted to be alert and oriented x2 but was previously alert and oriented x4 -CT scan did not show any acute abnormalities; he does have a prior history of mitral valve endocarditis with septic emboli to the brain and to the lung -He appears more appropriate today and appears closer to his baseline so we will hold off further investigation -Continue with neurochecks per protocol  ESRD on Hemodialysis Monday Wednesday  Secondary hyperparathyroidism -Patient has a history of poorly cystic kidney disease Normally dialyzes Monday Wednesday Friday -He was dialyzed following contrast yesterday and will be dialyzed again tomorrow after the MRI is done -Nephrology been consulted and appreciate further evaluation recommendations and they are planning for dialysis tomorrow first shift -Patient's BUNs/creatinine went from 56/8.20 and is now 29/4.91 after his dialysis yesterday -Given that patient's calcium is at goal and his phosphorus is low they will  discontinue binders but they recommended continue Cinacalcet 60 mg p.o. 4 times daily; will discontinue sevelamer carbonate 600 g p.o. 3 times daily  Acute Respiratory Failure with Hypoxia -Was placed on 2 L supplemental oxygen via nasal cannula and does not wear supplemental oxygen at home -Chest x-ray yesterday showed "Lower lung volumes with atelectasis. Chronic cardiomegaly and right lung  Scarring." -Patient does have a history of MRSA bacteremia with septic emboli to the lungs -SpO2: 93 % O2 Flow Rate (L/min): 2 L/min -Continuous pulse oximetry maintain O2 saturations greater than 90% Continue supplemental oxygen via nasal cannula and wean O2 as tolerated -We will need a repeat chest x-ray and an ambulatory home O2 screen prior to discharge  History of MRSA bacteremia with mitral valve endocarditis and PFO and septic emboli to the brain and left lung -He previously completed antibiotics on 11/25/2020 but now is back with another bacteremia as above  Anemia of chronic kidney disease -Patient presented with a hemoglobin 9.4 g/dL which appears near his baseline; his hemoglobin/hematocrit today is 9.3/31.0 -check anemia panel in the a.m. -Continue to monitor for signs and symptoms of bleeding; no overt bleeding noted -Repeat CBC in the a.m.  Hypokalemia -Mild -The patient is potassium went from 4.4 and is now 3.4 -Defer to nephrology to replete as-continue monitor and replete as  necessary and check magnesium level in the a.m. -Repeat CMP in the a.m.  Chronic Pain Syndrome with Chronic Opiate Dependence -He reports chronic back pain as well as right lower extremity that was recently amputated -Resuming home regimen of OxyContin and hydromorphone as needed however given his back pain will need to rule out infection etiology and he will undergo an MRI with and without contrast in the morning  Status post right AKA -Recently had an AKA done in the last 2 months -Continue with Pregabalin  75 mg p.o. 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  GERD -Continue with PPI with pantoprazole 40 mg p.o. daily  DVT prophylaxis: Heparin 5000 units subcu every 8 Code Status: FULL CODE  Family Communication: No family present at bedside  Disposition Plan: Pending further clinical work-up and evaluation by infectious diseases as well as nephrology and evaluation of his mitral valve with a TEE  Status is: Inpatient  Remains inpatient appropriate because:Unsafe d/c plan, IV treatments appropriate due to intensity of illness or inability to take PO and Inpatient level of care appropriate due to severity of illness   Dispo: The patient is from: Home              Anticipated d/c is to: Home              Patient currently is not medically stable to d/c.   Difficult to place patient No  Consultants:   Infectious Diseases  Nephrology  Cardiology for TEE   Procedures: TEE to be Done Monday, MRI Thoracic Spine to be done Tomorrow   Antimicrobials:  Anti-infectives (From admission, onward)   Start     Dose/Rate Route Frequency Ordered Stop   12/22/20 1201  vancomycin variable dose per unstable renal function (pharmacist dosing)         Does not apply See admin instructions 12/22/20 1202     12/22/20 1200  vancomycin (VANCOREADY) IVPB 750 mg/150 mL  Status:  Discontinued        750 mg 150 mL/hr over 60 Minutes Intravenous Every M-W-F (Hemodialysis) 12/22/20 0845 12/22/20 1202   12/22/20 1200  ceFEPIme (MAXIPIME) 2 g in sodium chloride 0.9 % 100 mL IVPB        2 g 200 mL/hr over 30 Minutes Intravenous Every M-W-F (Hemodialysis) 12/22/20 0845      12/22/20 0715  vancomycin (VANCOREADY) IVPB 1500 mg/300 mL        1,500 mg 150 mL/hr over 120 Minutes Intravenous  Once 12/22/20 0714 12/22/20 1034   12/22/20 0715  ceFEPIme (MAXIPIME) 2 g in sodium chloride 0.9 % 100 mL IVPB        2 g 200 mL/hr over 30 Minutes Intravenous  Once 12/22/20 0714 12/22/20 0806       Subjective: Seen and examined at bedside and his mentation was doing little bit better.  States that he was having fevers yesterday and was not responding appropriately at his facility.  Does not know what happened but he is doing better now.  Recently had his right leg amputated and has an AKA.  No nausea or vomiting currently.  Continues to have some back pain.  No other concerns or complaints  this time.  Objective: Vitals:   12/23/20 0325 12/23/20 0635 12/23/20 0730 12/23/20 1137  BP: 109/80 98/74 117/78 117/89  Pulse: (!) 102 98 91 (!) 103  Resp: '16 16 11 20  '$ Temp: 98.4 F (36.9 C) 98.2 F (36.8 C) 97.6 F (36.4 C) 98.4 F (36.9 C)  TempSrc: Oral Oral Oral Oral  SpO2: 96% 97% 97% 93%  Height:        Intake/Output Summary (Last 24 hours) at 12/23/2020 1420 Last data filed at 12/23/2020 1007 Gross per 24 hour  Intake 290 ml  Output -500 ml  Net 790 ml   Filed Weights   Examination: Physical Exam:  Constitutional: WN/WD African-American male currently in NAD and appears calm and comfortable Eyes: Lids and conjunctivae normal, sclerae anicteric  ENMT: External Ears, Nose appear normal. Grossly normal hearing.  Neck: Appears normal, supple, no cervical masses, normal ROM, no appreciable thyromegaly: No JVD Respiratory: Diminished to auscultation bilaterally with coarse breath sounds, no wheezing, rales, rhonchi or crackles. Normal respiratory effort and patient is not tachypenic. No accessory muscle use.  Unlabored breathing but is wearing 2 L supplemental oxygen via nasal cannula Cardiovascular: RRR, has a murmur noted. S1 and S2 auscultated. Abdomen: Soft,  non-tender, slightly distended. Bowel sounds positive.  GU: Deferred. Musculoskeletal: No clubbing / cyanosis of digits/nails.  Has a right AKA Skin: No rashes, lesions, ulcers on to skin evaluation. No induration; Warm and dry.  Neurologic: CN 2-12 grossly intact with no focal deficits. Romberg sign and cerebellar reflexes not assessed.  Psychiatric: Normal judgment and insight. Alert and oriented x 3. Normal mood and appropriate affect.   Data Reviewed: I have personally reviewed following labs and imaging studies  CBC: Recent Labs  Lab 12/22/20 0714 12/23/20 0119  WBC 20.2* 19.7*  NEUTROABS 18.0*  --   HGB 9.4* 9.3*  HCT 32.0* 31.0*  MCV 82.5 80.7  PLT 215 [id number]   Basic Metabolic Panel: Recent Labs  Lab 12/22/20 0714 12/23/20 0119  NA 133* 135  K 4.4 3.4*  CL 92* 95*  CO2 25 25  GLUCOSE 82 66*  BUN 56* 29*  CREATININE 8.20* 4.91*  CALCIUM 8.8* 8.4*  PHOS  --  1.8*   GFR: Estimated Creatinine Clearance: 20 mL/min (A) (by C-G formula based on SCr of 4.91 mg/dL (H)). Liver Function Tests: Recent Labs  Lab 12/22/20 0714 12/23/20 0119  AST 20  --   ALT 10  --   ALKPHOS 74  --   BILITOT 1.0  --   PROT 8.4*  --   ALBUMIN 2.4* 2.2*   No results for input(s): LIPASE, AMYLASE in the last 168 hours. No results for input(s): AMMONIA in the last 168 hours. Coagulation Profile: Recent Labs  Lab 12/22/20 0714  INR 1.3*   Cardiac Enzymes: No results for input(s): CKTOTAL, CKMB, CKMBINDEX, TROPONINI in the last 168 hours. BNP (last 3 results) No results for input(s): PROBNP in the last 8760 hours. HbA1C: No results for input(s): HGBA1C in the last 72 hours. CBG: Recent Labs  Lab 12/22/20 1402 12/22/20 1804  GLUCAP 83 82   Lipid Profile: No results for input(s): CHOL, HDL, LDLCALC, TRIG, CHOLHDL, LDLDIRECT in the last 72 hours. Thyroid Function Tests: No results for input(s): TSH, T4TOTAL, FREET4, T3FREE, THYROIDAB in the last 72 hours. Anemia Panel: No  results for input(s): VITAMINB12, FOLATE, FERRITIN, TIBC, IRON, RETICCTPCT in the last 72 hours. Sepsis Labs: Recent Labs  Lab 12/22/20 0714  LATICACIDVEN 1.7  Recent Results (from the past 240 hour(s))  Resp Panel by RT-PCR (Flu A&B, Covid) Nasopharyngeal Swab     Status: None   Collection Time: 12/22/20  7:14 AM   Specimen: Nasopharyngeal Swab; Nasopharyngeal(NP) swabs in vial transport medium  Result Value Ref Range Status   SARS Coronavirus 2 by RT PCR NEGATIVE NEGATIVE Final    Comment: (NOTE) SARS-CoV-2 target nucleic acids are NOT DETECTED.  The SARS-CoV-2 RNA is generally detectable in upper respiratory specimens during the acute phase of infection. The lowest concentration of SARS-CoV-2 viral copies this assay can detect is 138 copies/mL. A negative result does not preclude SARS-Cov-2 infection and should not be used as the sole basis for treatment or other patient management decisions. A negative result may occur with  improper specimen collection/handling, submission of specimen other than nasopharyngeal swab, presence of viral mutation(s) within the areas targeted by this assay, and inadequate number of viral copies(<138 copies/mL). A negative result must be combined with clinical observations, patient history, and epidemiological information. The expected result is Negative.  Fact Sheet for Patients:  EntrepreneurPulse.com.au  Fact Sheet for Healthcare Providers:  IncredibleEmployment.be  This test is no t yet approved or cleared by the [place] FDA and  has been authorized for detection and/or diagnosis of SARS-CoV-2 by FDA under an Emergency Use Authorization (EUA). This EUA will remain  in effect (meaning this test can be used) for the duration of the COVID-19 declaration under Section 564(b)(1) of the Act, 21 U.S.C.section 360bbb-3(b)(1), unless the authorization is terminated  or revoked sooner.       Influenza  A by PCR NEGATIVE NEGATIVE Final   Influenza B by PCR NEGATIVE NEGATIVE Final    Comment: (NOTE) The Xpert Xpress SARS-CoV-2/FLU/RSV plus assay is intended as an aid in the diagnosis of influenza from Nasopharyngeal swab specimens and should not be used as a sole basis for treatment. Nasal washings and aspirates are unacceptable for Xpert Xpress SARS-CoV-2/FLU/RSV testing.  Fact Sheet for Patients: EntrepreneurPulse.com.au  Fact Sheet for Healthcare Providers: IncredibleEmployment.be  This test is not yet approved or cleared by the [place] FDA and has been authorized for detection and/or diagnosis of SARS-CoV-2 by FDA under an Emergency Use Authorization (EUA). This EUA will remain in effect (meaning this test can be used) for the duration of the COVID-19 declaration under Section 564(b)(1) of the Act, 21 U.S.C. section 360bbb-3(b)(1), unless the authorization is terminated or revoked.  Performed at [facility], [place] [address], [place], [place] [address]   Blood Culture (routine x 2)     Status: Abnormal (Preliminary result)   Collection Time: 12/22/20  7:14 AM   Specimen: BLOOD  Result Value Ref Range Status   Specimen Description BLOOD SITE NOT SPECIFIED  Final   Special Requests   Final    BOTTLES DRAWN AEROBIC AND ANAEROBIC Blood Culture adequate volume   Culture  Setup Time   Final    GRAM POSITIVE COCCI IN CLUSTERS IN BOTH AEROBIC AND ANAEROBIC BOTTLES CRITICAL RESULT CALLED TO, READ BACK BY AND VERIFIED WITH: Dion Body PHARMD 2028 12/22/20 A BROWNING    Culture (A)  Final    STAPHYLOCOCCUS AUREUS SUSCEPTIBILITIES TO FOLLOW Performed at [facility], [place] [address], [place], [place] [address]    Report Status PENDING  Incomplete  Blood Culture ID Panel (Reflexed)     Status: Abnormal   Collection Time: 12/22/20  7:14 AM  Result Value Ref Range Status   Enterococcus faecalis  NOT DETECTED NOT DETECTED Final    Enterococcus Faecium NOT DETECTED NOT DETECTED Final   Listeria monocytogenes NOT DETECTED NOT DETECTED Final   Staphylococcus species DETECTED (A) NOT DETECTED Final    Comment: CRITICAL RESULT CALLED TO, READ BACK BY AND VERIFIED WITH: Dion Body PHARMD 2028 12/22/20 A BROWNING    Staphylococcus aureus (BCID) DETECTED (A) NOT DETECTED Final    Comment: Methicillin (oxacillin)-resistant Staphylococcus aureus (MRSA). MRSA is predictably resistant to beta-lactam antibiotics (except ceftaroline). Preferred therapy is vancomycin unless clinically contraindicated. Patient requires contact precautions if  hospitalized. CRITICAL RESULT CALLED TO, READ BACK BY AND VERIFIED WITH: Dion Body PHARMD 2028 12/22/20 A BROWNING    Staphylococcus epidermidis NOT DETECTED NOT DETECTED Final   Staphylococcus lugdunensis NOT DETECTED NOT DETECTED Final   Streptococcus species NOT DETECTED NOT DETECTED Final   Streptococcus agalactiae NOT DETECTED NOT DETECTED Final   Streptococcus pneumoniae NOT DETECTED NOT DETECTED Final   Streptococcus pyogenes NOT DETECTED NOT DETECTED Final   A.calcoaceticus-baumannii NOT DETECTED NOT DETECTED Final   Bacteroides fragilis NOT DETECTED NOT DETECTED Final   Enterobacterales NOT DETECTED NOT DETECTED Final   Enterobacter cloacae complex NOT DETECTED NOT DETECTED Final   Escherichia coli NOT DETECTED NOT DETECTED Final   Klebsiella aerogenes NOT DETECTED NOT DETECTED Final   Klebsiella oxytoca NOT DETECTED NOT DETECTED Final   Klebsiella pneumoniae NOT DETECTED NOT DETECTED Final   Proteus species NOT DETECTED NOT DETECTED Final   Salmonella species NOT DETECTED NOT DETECTED Final   Serratia marcescens NOT DETECTED NOT DETECTED Final   Haemophilus influenzae NOT DETECTED NOT DETECTED Final   Neisseria meningitidis NOT DETECTED NOT DETECTED Final   Pseudomonas aeruginosa NOT DETECTED NOT DETECTED Final   Stenotrophomonas maltophilia NOT DETECTED NOT DETECTED Final   Candida  albicans NOT DETECTED NOT DETECTED Final   Candida auris NOT DETECTED NOT DETECTED Final   Candida glabrata NOT DETECTED NOT DETECTED Final   Candida krusei NOT DETECTED NOT DETECTED Final   Candida parapsilosis NOT DETECTED NOT DETECTED Final   Candida tropicalis NOT DETECTED NOT DETECTED Final   Cryptococcus neoformans/gattii NOT DETECTED NOT DETECTED Final   Meth resistant mecA/C and MREJ DETECTED (A) NOT DETECTED Final    Comment: CRITICAL RESULT CALLED TO, READ BACK BY AND VERIFIED WITHDion Body [facility] 2028 12/22/20 A BROWNING Performed at [facility], [address] [place], [place] [address]   Blood Culture (routine x 2)     Status: Abnormal (Preliminary result)   Collection Time: 12/22/20  7:19 AM   Specimen: BLOOD  Result Value Ref Range Status   Specimen Description BLOOD SITE NOT SPECIFIED  Final   Special Requests   Final    BOTTLES DRAWN AEROBIC AND ANAEROBIC Blood Culture results may not be optimal due to an excessive volume of blood received in culture bottles   Culture  Setup Time   Final    GRAM POSITIVE COCCI IN CLUSTERS IN BOTH AEROBIC AND ANAEROBIC BOTTLES IDENTIFICATION TO FOLLOW CRITICAL RESULT CALLED TO, READ BACK BY AND VERIFIED WITHDion Body [facility] 2028 12/22/20 A BROWNING Performed at [facility], [place] [address], [place],  [address]    Culture STAPHYLOCOCCUS AUREUS (A)  Final   Report Status PENDING  Incomplete     RN Pressure Injury Documentation:     Estimated body mass index is 23.92 kg/m as calculated from the following:   Height as of this encounter: 6' (1.829 m).   Weight as of  12/13/20: 80 kg.  Malnutrition Type:   Malnutrition Characteristics:   Nutrition Interventions:   Radiology Studies: CT Head Wo Contrast  Result Date: 12/22/2020 CLINICAL DATA:  Mental status changes of unknown cause.  Confusion. EXAM: CT HEAD WITHOUT CONTRAST TECHNIQUE: Contiguous axial images were obtained from the base of the skull through  the vertex without intravenous contrast. COMPARISON:  September 28, 2020. FINDINGS: Brain: No evidence of acute infarction, hemorrhage, hydrocephalus, extra-axial collection or mass lesion/mass effect. Areas of suspected infarct seen on previous imaging not well demonstrated on the current exam. Area of hypodensity in the RIGHT frontal periventricular white matter on image 20 of series 4 appears to have been present on the prior study better and is demonstrated on the previous MRI. Vascular: No hyperdense vessel or unexpected calcification. Skull: Normal. Negative for fracture or focal lesion. Sinuses/Orbits: Visualized paranasal sinuses and orbits are unremarkable. Other: None. IMPRESSION: 1. No acute intracranial abnormality. 2. Subtle changes related to previously described areas of infarct better demonstrated on recent MRI. Electronically Signed   By: Zetta Bills M.D.   On: 12/22/2020 11:25   MR Lumbar Spine W Wo Contrast  Result Date: 12/22/2020 CLINICAL DATA:  Low back pain, possible infection EXAM: MRI LUMBAR SPINE WITHOUT AND WITH CONTRAST TECHNIQUE: Multiplanar and multiecho pulse sequences of the lumbar spine were obtained without and with intravenous contrast. CONTRAST:  7.93m GADAVIST GADOBUTROL 1 MMOL/ML IV SOLN COMPARISON:  Lumbar MRI 09/30/2020 FINDINGS: Despite efforts by the technologist and patient, motion artifact is present on today's exam and could not be eliminated. This reduces exam sensitivity and specificity. Segmentation: The lowest lumbar type non-rib-bearing vertebra is labeled as L5. Alignment:  Unremarkable Vertebrae: Type 2 degenerative endplate findings at [id number]-[id number] endplate irregularities, disc desiccation, and loss of disc height. This is mostly similar to the 09/30/2020 exam. There is also disc desiccation at L4-5 similar to prior. No significant abnormal degree of bony enhancement. Conus medullaris and cauda equina: Conus extends to the L1 level. Conus and cauda equina  appear normal. Paraspinal and other soft tissues: Innumerable cysts of varying size and complexity scattered in both kidneys compatible with polycystic kidney disease. The kidneys are indistinct due to motion in today's exam was not optimized to assess the kidneys. Small retroperitoneal lymph nodes are not pathologically enlarged by size criteria. Disc levels: L1-2: Unremarkable. L2-3: Unremarkable. L3-4: Unremarkable. L4-5: Mild bilateral subarticular lateral recess stenosis due to disc bulge, minimally worsened from prior. L5-S1: Mild bilateral subarticular lateral recess stenosis and borderline bilateral foraminal stenosis due to intervertebral spurring, disc bulge, and facet arthropathy, similar to prior. IMPRESSION: 1. No findings of discitis or osteomyelitis. No compelling findings of epidural or psoas abscess. 2. Lumbar spondylosis and degenerative disc disease with mild impingement at L4-5 and L5-S1. Degenerative endplate findings particularly at L5-S1. 3. Autosomal dominant polycystic kidney disease. Electronically Signed   By: WVan ClinesM.D.   On: 12/22/2020 12:19   DG Chest Port 1 View  Result Date: 12/22/2020 CLINICAL DATA:  50year old male with possible sepsis. Dialysis patient. Weakness. EXAM: PORTABLE CHEST 1 VIEW COMPARISON:  Chest radiographs 12/13/2020 and earlier. FINDINGS: Portable AP semi upright view at 0734 hours. Lower lung volumes. Stable cardiomegaly and mediastinal contours. Visualized tracheal air column is within normal limits. No pneumothorax. Chronic architectural distortion in the right lung with curvilinear opacity is stable. Mild hypo ventilation and atelectasis now at both lung bases. No consolidation. No pleural effusion is evident. Stable visualized osseous structures. Abundant chronic dystrophic calcification about  the visible right shoulder. Chronic left subclavian vascular stent. Paucity of bowel gas in the upper abdomen. IMPRESSION: 1. Lower lung volumes with  atelectasis. 2. Chronic cardiomegaly and right lung scarring. Electronically Signed   By: Genevie Ann M.D.   On: 12/22/2020 07:50   Scheduled Meds: . (feeding supplement) PROSource Plus  30 mL Oral BID BM  . aspirin  325 mg Oral Daily  . Chlorhexidine Gluconate Cloth  6 each Topical Q0600  . [START ON 12/24/2020] Chlorhexidine Gluconate Cloth  6 each Topical Q0600  . [START ON 12/24/2020] cinacalcet  60 mg Oral Q supper  . docusate sodium  100 mg Oral BID  . feeding supplement (NEPRO CARB STEADY)  237 mL Oral TID BM  . heparin  5,000 Units Subcutaneous Q8H  . methocarbamol  500 mg Oral TID  . multivitamin with minerals  1 tablet Oral Daily  . oxyCODONE  15 mg Oral Q12H  . pantoprazole  40 mg Oral Daily  . pregabalin  75 mg Oral QHS  . senna-docusate  1 tablet Oral BID  . sevelamer carbonate  1,600 mg Oral TID WC  . sodium chloride flush  3 mL Intravenous Q12H  . vancomycin variable dose per unstable renal function (pharmacist dosing)   Does not apply See admin instructions   Continuous Infusions: . sodium chloride    . sodium chloride    . ceFEPime (MAXIPIME) IV 2 g (12/22/20 2054)    LOS: 1 day   Kerney Elbe, DO [facility] PAGER is on [place]  If 7PM-7AM, please contact night-coverage [email or web address]

## 2020-12-23 NOTE — Progress Notes (Signed)
Last temp check 99.1. During yellow MEWS vital sign check, temp had spiked back to 101.2. Dr.Sheikh notified. Unable to receive Tylenol at this time. Ice packs applied to underarms and groin. Fan and ice water given to patient. Patient has no rigors at this time. Will pass off to night shift RN.   12/23/20 1847  Assess: MEWS Score  Temp (!) 101.2 F (38.4 C)  BP (!) 151/132  Pulse Rate (!) 130  ECG Heart Rate (!) 130  Resp 10  SpO2 90 %  O2 Device Nasal Cannula  O2 Flow Rate (L/min) 4 L/min  Assess: MEWS Score  MEWS Temp 1  MEWS Systolic 0  MEWS Pulse 3  MEWS RR 1  MEWS LOC 0  MEWS Score 5  MEWS Score Color Red  Assess: if the MEWS score is Yellow or Red  Were vital signs taken at a resting state? Yes  Focused Assessment No change from prior assessment  Early Detection of Sepsis Score *See Row Information* High  MEWS guidelines implemented *See Row Information* Yes  Treat  MEWS Interventions Administered prn meds/treatments;Escalated (See documentation below)  Take Vital Signs  Increase Vital Sign Frequency  Red: Q 1hr X 4 then Q 4hr X 4, if remains red, continue Q 4hrs  Escalate  MEWS: Escalate Red: discuss with charge nurse/RN and provider, consider discussing with RRT  Notify: Charge Nurse/RN  Name of Charge Nurse/RN Notified Erin, RN  Date Charge Nurse/RN Notified 12/23/20  Time Charge Nurse/RN Notified 1852  Notify: Provider  Provider Name/Title Raiford Noble, DO  Date Provider Notified 12/23/20  Time Provider Notified 1850  Notification Type Page  Notification Reason Other (Comment) (per protocol)  Provider response See new orders  Date of Provider Response 12/23/20  Time of Provider Response 1852  Notify: Rapid Response  Name of Rapid Response RN Notified n/a  Document  Patient Outcome Other (Comment) (remains stable. Ice packs applied)  Progress note created (see row info) Yes

## 2020-12-23 NOTE — Progress Notes (Addendum)
Patient's HR became elevated. Initial temperature check was normal, but recheck was 101.4. After administering PO Tylenol, temp has started decreasing (100.8 at 58mn post-Tylenol). 5056mNS bolus started per Dr.Sheikh's order. Will continue to monitor temperature and HR.  12/23/20 1714  Assess: MEWS Score  Temp (!) 101.4 F (38.6 C)  BP (!) 159/88  Pulse Rate (!) 124  ECG Heart Rate (!) 125  Resp 18  SpO2 90 %  O2 Device Room Air  Assess: MEWS Score  MEWS Temp 1  MEWS Systolic 0  MEWS Pulse 2  MEWS RR 0  MEWS LOC 0  MEWS Score 3  MEWS Score Color Yellow  Assess: if the MEWS score is Yellow or Red  Were vital signs taken at a resting state? Yes  Focused Assessment No change from prior assessment  Early Detection of Sepsis Score *See Row Information* High  MEWS guidelines implemented *See Row Information* Yes  Treat  MEWS Interventions Administered prn meds/treatments  Pain Scale 0-10  Pain Score 8  Pain Type Chronic pain  Pain Location Back  Pain Onset On-going  Pain Intervention(s) Medication (See eMAR)  Multiple Pain Sites No  Take Vital Signs  Increase Vital Sign Frequency  Yellow: Q 2hr X 2 then Q 4hr X 2, if remains yellow, continue Q 4hrs  Escalate  MEWS: Escalate Yellow: discuss with charge nurse/RN and consider discussing with provider and RRT  Notify: Charge Nurse/RN  Name of Charge Nurse/RN Notified Erin, RN  Date Charge Nurse/RN Notified 12/23/20  Time Charge Nurse/RN Notified [id number]: Provider  Provider Name/Title OmRaiford NobleDO  Date Provider Notified 12/23/20  Time Provider Notified [phone] Type Page  Notification Reason Other (Comment) (per protocol)  Provider response See new orders (50085mS bolus)  Date of Provider Response 12/23/20  Time of Provider Response 1723  Notify: Rapid Response  Name of Rapid Response RN Notified n/a  Document  Patient Outcome Other (Comment) (remains stable. PRN PO tylenol given and temp  decreasing. rigors have subsided and patient states he is feeling much better. HR remains elevated for now.)  Progress note created (see row info) Yes

## 2020-12-23 NOTE — Plan of Care (Signed)
  Problem: Activity: Goal: Risk for activity intolerance will decrease Outcome: Progressing   Problem: Elimination: Goal: Will not experience complications related to bowel motility Outcome: Progressing   Problem: Clinical Measurements: Goal: Ability to maintain clinical measurements within normal limits will improve Outcome: Not Progressing

## 2020-12-23 NOTE — Consult Note (Signed)
[facility] for Infectious Disease    Date of Admission:  12/22/2020      Total days of antibiotics 1  Vancomycin   Cefepime               Reason for Consult: MRSA Bacteremia    Referring Provider: CHAMP Primary Care Provider: [name], MD   Assessment: Patrick Anthony is a 50 y.o. 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 All surgical incisions appear healed, AVG functioning well without obvious infection concern but now with new mid-thoracic back pain around T9-10. He had a clean MR of the lumbar spine but I think he needs t-spine imaging to better assess the area of pain and unexplained bacteremia. Will order MR of T spine w/ and w/o (can do without if concerned re: HD patient and contrast --> he has planned session tomorrow first thing).   Will also arrange TEE for him to evaluate mitral valves and plan to repeat blood cultures after 48 hours of antibiotic therapy. Will stop cefepime and continue vancomycin. Need to clarify with his HD center whether he was getting vancomycin recently (it appears it was on his MAR).     Plan: 1. Continue vancomycin  2. Stop cefepime 3. Repeat blood cultures ordered for 5/20  4. TEE recommended given PV (spoke w/ cards)  5. T-spine MR w/ and w/o contrast preferred    Principal Problem:   Bacteremia due to Staphylococcus aureus Active Problems:   Anemia in chronic kidney disease   Hepatitis C   ESRD on dialysis ([place])   S/P mitral valve replacement   S/P AKA (above knee amputation), right ([facility])   Personal history of Methicillin resistant Staphylococcus aureus infection   SIRS (systemic inflammatory response syndrome) ([place])   . (feeding supplement) PROSource Plus  30 mL Oral BID BM  . aspirin  325 mg Oral Daily  . Chlorhexidine  Gluconate Cloth  6 each Topical Q0600  . [START ON 12/24/2020] Chlorhexidine Gluconate Cloth  6 each Topical Q0600  . [START ON 12/24/2020] cinacalcet  60 mg Oral Q supper  . docusate sodium  100 mg Oral BID  . feeding supplement (NEPRO CARB STEADY)  237 mL Oral TID BM  . heparin  5,000 Units Subcutaneous Q8H  . methocarbamol  500 mg Oral TID  . multivitamin with minerals  1 tablet Oral Daily  . oxyCODONE  15 mg Oral Q12H  . pantoprazole  40 mg Oral Daily  . pregabalin  75 mg Oral QHS  . senna-docusate  1 tablet Oral BID  . sevelamer carbonate  1,600 mg Oral TID WC  . sodium chloride flush  3 mL Intravenous Q12H  . vancomycin variable dose per unstable renal function (pharmacist dosing)   Does not apply See admin instructions    HPI: Patrick Anthony is a 50 y.o. male admitted from SNF [facility]) for evaluation of [place].  Recent surgical history of R AKA 3/25 and minimally invasive mitral valve replacement and PFO closure with Dr. Roxy Anthony 10/14/2020. Gore-Tex AV graft placed in March 2021  Patrick Anthony does not remember being brought to the hospital from the SNF. Was told that they came to get him for dialysis and he was not responding to them., which prompted transport to the hospital for evaluation. In  the ER Triage notes he was reported to have had fevers to 103 F for 3 days prior to admission. Tachycardic and hypoxic on admission recovered on 2 LPM nasal cannula.  He was started on vancomycin and cefepime for treatment initially after cultures collected. Blood cultures grew out MRSA at a high degree of burden pretty quickly.  Patrick Anthony says that he has no new wounds on his skin. No problems with his L AVG with HD sessions and no pain/tenderenss or changes to the anatomy of the site. The primary problem he reports is mid back pain that has been present the last few weeks. He describes this to be new pain and worsening. Can localize it to around T9-10. No hand tingling or weakness. He is frustrated  because he went through with the amputation of the leg to try to avoid any further infections.    Review of Systems: Review of Systems  Constitutional: Positive for fever.  Eyes: Negative for blurred vision.  Respiratory: Negative for cough and shortness of breath.   Cardiovascular: Negative for chest pain and leg swelling.  Gastrointestinal: Negative for abdominal pain, nausea and vomiting.  Musculoskeletal: Positive for back pain. Negative for joint pain and neck pain.  Skin: Negative for rash.  Neurological: Negative for dizziness, tingling, focal weakness, weakness and headaches.    Past Medical History:  Diagnosis Date  . 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 Chronic kidney disease    patient on dialysis tues-thurs-sat  . Cirrhosis of liver ([place])   . Drug addiction ([place])   . ETOH abuse   . Hepatitis   . Hypertension   . Mitral regurgitation   . Patent foramen ovale 10/23/2019  . Renal disorder renal failure  . S/P minimally-invasive mitral valve replacement with bioprosthetic valve 10/14/2020   31 mm Medtronic Mosaic stented porcine bioprosthetic tissue valve via right mini thoracotomy approach  . Sleep apnea   . 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 Smoking status: Former Smoker    Packs/day: 0.20    Types: Cigarettes    Quit date: 10/05/2017    Years since quitting: 3.2  . Smokeless tobacco: Never Used  . Tobacco comment: Quit 3 years ago  Vaping Use  . Vaping Use: Never used  Substance Use Topics  . Alcohol use: No  . Drug use: No    Comment: Quit    Family History  Problem Relation Age of Onset  . Hypertension Mother   . Cancer Mother        breast   No Known Allergies  OBJECTIVE: Blood pressure 117/89, pulse (!) 103, temperature 98.4 F (36.9 C), temperature source Oral, resp. rate 20, height  6' (1.829 m), SpO2 93 %.  Physical Exam Vitals reviewed.  Constitutional:      General: He is not in acute distress.    Appearance: He is not ill-appearing.     Comments: Sitting up at sink washing up  HENT:     Mouth/Throat:     Mouth: Mucous membranes are moist.     Pharynx: Oropharynx is clear.  Eyes:     General: No scleral icterus.    Pupils: Pupils are equal, round, and reactive to light.  Cardiovascular:     Rate and Rhythm: Normal rate and regular rhythm.  Pulmonary:  Effort: Pulmonary effort is normal.     Breath sounds: Normal breath sounds.  Abdominal:     General: Bowel sounds are normal.     Palpations: Abdomen is soft.  Musculoskeletal:     Cervical back: Normal range of motion.     Thoracic back: Bony tenderness present. No swelling, deformity or signs of trauma.       Back:  Skin:    General: Skin is warm and dry.     Comments: L FA AVG dressed in clean bandage. No pain/erythema or warmth   Neurological:     Mental Status: He is alert.     Lab Results Lab Results  Component Value Date   WBC 19.7 (H) 12/23/2020   HGB 9.3 (L) 12/23/2020   HCT 31.0 (L) 12/23/2020   MCV 80.7 12/23/2020   PLT 228 12/23/2020    Lab Results  Component Value Date   CREATININE 4.91 (H) 12/23/2020   BUN 29 (H) 12/23/2020   NA 135 12/23/2020   K 3.4 (L) 12/23/2020   CL 95 (L) 12/23/2020   CO2 25 12/23/2020    Lab Results  Component Value Date   ALT 10 12/22/2020   AST 20 12/22/2020   ALKPHOS 74 12/22/2020   BILITOT 1.0 12/22/2020     Microbiology: Recent Results (from the past 240 hour(s))  Resp Panel by RT-PCR (Flu A&B, Covid) Nasopharyngeal Swab     Status: None   Collection Time: 12/22/20  7:14 AM   Specimen: Nasopharyngeal Swab; Nasopharyngeal(NP) swabs in vial transport medium  Result Value Ref Range Status   SARS Coronavirus 2 by RT PCR NEGATIVE NEGATIVE Final    Comment: (NOTE) SARS-CoV-2 target nucleic acids are NOT DETECTED.  The SARS-CoV-2 RNA  is generally detectable in upper respiratory specimens during the acute phase of infection. The lowest concentration of SARS-CoV-2 viral copies this assay can detect is 138 copies/mL. A negative result does not preclude SARS-Cov-2 infection and should not be used as the sole basis for treatment or other patient management decisions. A negative result may occur with  improper specimen collection/handling, submission of specimen other than nasopharyngeal swab, presence of viral mutation(s) within the areas targeted by this assay, and inadequate number of viral copies(<138 copies/mL). A negative result must be combined with clinical observations, patient history, and epidemiological information. The expected result is Negative.  Fact Sheet for Patients:  EntrepreneurPulse.com.au  Fact Sheet for Healthcare Providers:  IncredibleEmployment.be  This test is no t yet approved or cleared by the [place] FDA and  has been authorized for detection and/or diagnosis of SARS-CoV-2 by FDA under an Emergency Use Authorization (EUA). This EUA will remain  in effect (meaning this test can be used) for the duration of the COVID-19 declaration under Section 564(b)(1) of the Act, 21 U.S.C.section 360bbb-3(b)(1), unless the authorization is terminated  or revoked sooner.       Influenza A by PCR NEGATIVE NEGATIVE Final   Influenza B by PCR NEGATIVE NEGATIVE Final    Comment: (NOTE) The Xpert Xpress SARS-CoV-2/FLU/RSV plus assay is intended as an aid in the diagnosis of influenza from Nasopharyngeal swab specimens and should not be used as a sole basis for treatment. Nasal washings and aspirates are unacceptable for Xpert Xpress SARS-CoV-2/FLU/RSV testing.  Fact Sheet for Patients: EntrepreneurPulse.com.au  Fact Sheet for Healthcare Providers: IncredibleEmployment.be  This test is not yet approved or cleared by the  [place] FDA and has been authorized for detection and/or diagnosis of SARS-CoV-2 by  FDA under an Emergency Use Authorization (EUA). This EUA will remain in effect (meaning this test can be used) for the duration of the COVID-19 declaration under Section 564(b)(1) of the Act, 21 U.S.C. section 360bbb-3(b)(1), unless the authorization is terminated or revoked.  Performed at [facility], [place] [address], [place], [place] [address]   Blood Culture (routine x 2)     Status: Abnormal (Preliminary result)   Collection Time: 12/22/20  7:14 AM   Specimen: BLOOD  Result Value Ref Range Status   Specimen Description BLOOD SITE NOT SPECIFIED  Final   Special Requests   Final    BOTTLES DRAWN AEROBIC AND ANAEROBIC Blood Culture adequate volume   Culture  Setup Time   Final    GRAM POSITIVE COCCI IN CLUSTERS IN BOTH AEROBIC AND ANAEROBIC BOTTLES CRITICAL RESULT CALLED TO, READ BACK BY AND VERIFIED WITH: Dion Body PHARMD 2028 12/22/20 A BROWNING    Culture (A)  Final    STAPHYLOCOCCUS AUREUS SUSCEPTIBILITIES TO FOLLOW Performed at [facility], [place] [address] [place], [place] [address]    Report Status PENDING  Incomplete  Blood Culture ID Panel (Reflexed)     Status: Abnormal   Collection Time: 12/22/20  7:14 AM  Result Value Ref Range Status   Enterococcus faecalis NOT DETECTED NOT DETECTED Final   Enterococcus Faecium NOT DETECTED NOT DETECTED Final   Listeria monocytogenes NOT DETECTED NOT DETECTED Final   Staphylococcus species DETECTED (A) NOT DETECTED Final    Comment: CRITICAL RESULT CALLED TO, READ BACK BY AND VERIFIED WITH: [facility] PHARMD 2028 12/22/20 A BROWNING    Staphylococcus aureus (BCID) DETECTED (A) NOT DETECTED Final    Comment: Methicillin (oxacillin)-resistant Staphylococcus aureus (MRSA). MRSA is predictably resistant to beta-lactam antibiotics (except ceftaroline). Preferred therapy is vancomycin unless clinically contraindicated. Patient requires  contact precautions if  hospitalized. CRITICAL RESULT CALLED TO, READ BACK BY AND VERIFIED WITH: Dion Body PHARMD 2028 12/22/20 A BROWNING    Staphylococcus epidermidis NOT DETECTED NOT DETECTED Final   Staphylococcus lugdunensis NOT DETECTED NOT DETECTED Final   Streptococcus species NOT DETECTED NOT DETECTED Final   Streptococcus agalactiae NOT DETECTED NOT DETECTED Final   Streptococcus pneumoniae NOT DETECTED NOT DETECTED Final   Streptococcus pyogenes NOT DETECTED NOT DETECTED Final   A.calcoaceticus-baumannii NOT DETECTED NOT DETECTED Final   Bacteroides fragilis NOT DETECTED NOT DETECTED Final   Enterobacterales NOT DETECTED NOT DETECTED Final   Enterobacter cloacae complex NOT DETECTED NOT DETECTED Final   Escherichia coli NOT DETECTED NOT DETECTED Final   Klebsiella aerogenes NOT DETECTED NOT DETECTED Final   Klebsiella oxytoca NOT DETECTED NOT DETECTED Final   Klebsiella pneumoniae NOT DETECTED NOT DETECTED Final   Proteus species NOT DETECTED NOT DETECTED Final   Salmonella species NOT DETECTED NOT DETECTED Final   Serratia marcescens NOT DETECTED NOT DETECTED Final   Haemophilus influenzae NOT DETECTED NOT DETECTED Final   Neisseria meningitidis NOT DETECTED NOT DETECTED Final   Pseudomonas aeruginosa NOT DETECTED NOT DETECTED Final   Stenotrophomonas maltophilia NOT DETECTED NOT DETECTED Final   Candida albicans NOT DETECTED NOT DETECTED Final   Candida auris NOT DETECTED NOT DETECTED Final   Candida glabrata NOT DETECTED NOT DETECTED Final   Candida krusei NOT DETECTED NOT DETECTED Final   Candida parapsilosis NOT DETECTED NOT DETECTED Final   Candida tropicalis NOT DETECTED NOT DETECTED Final   Cryptococcus neoformans/gattii NOT DETECTED NOT DETECTED Final   Meth resistant mecA/C and MREJ DETECTED (A)  NOT DETECTED Final    Comment: CRITICAL RESULT CALLED TO, READ BACK BY AND VERIFIED WITHDion Body [facility] 2028 12/22/20 A BROWNING Performed at [facility],  [place] [address], [place], [place] [address]   Blood Culture (routine x 2)     Status: Abnormal (Preliminary result)   Collection Time: 12/22/20  7:19 AM   Specimen: BLOOD  Result Value Ref Range Status   Specimen Description BLOOD SITE NOT SPECIFIED  Final   Special Requests   Final    BOTTLES DRAWN AEROBIC AND ANAEROBIC Blood Culture results may not be optimal due to an excessive volume of blood received in culture bottles   Culture  Setup Time   Final    GRAM POSITIVE COCCI IN CLUSTERS IN BOTH AEROBIC AND ANAEROBIC BOTTLES IDENTIFICATION TO FOLLOW CRITICAL RESULT CALLED TO, READ BACK BY AND VERIFIED WITHDion Body [facility] 2028 12/22/20 A BROWNING Performed at [facility], [place] [address], [place], [place] [address]    Culture STAPHYLOCOCCUS AUREUS (A)  Final   Report Status PENDING  Incomplete     Janene Madeira, MSN, NP-C [place] [facility]ious Disease [email or web address] Pager: [phone] Office: [phone] [place]: [phone]

## 2020-12-23 NOTE — Progress Notes (Signed)
   Cardiology asked to performed TEE. Came by to consent patient. He is still very groggy and confused this afternoon. He could not answering many orientation question. I do not think he is able to consent at this time. I tried to call his daughter Kurt Houghland at number listed in chart) with patient's permission; however, she did not answer. Left message to call back to nursing floor. Will go ahead and make patient NPO at midnight but we will have to try to obtain consent again at a later time.  Darreld Mclean, PA-C 12/23/2020 4:57 PM

## 2020-12-23 NOTE — Progress Notes (Signed)
[facility] Progress Note   Subjective:   Patient seen and examined at bedside.  Back pain well controlled today. Denies CP, SOB, n/v/d, abdominal pain, fever and confusion.   Objective Vitals:   12/23/20 0209 12/23/20 0325 12/23/20 0635 12/23/20 0730  BP:  109/80 98/74 117/78  Pulse: (!) 115 (!) 102 98 91  Resp: '19 16 16 11  '$ Temp: 100.1 F (37.8 C) 98.4 F (36.9 C) 98.2 F (36.8 C) 97.6 F (36.4 C)  TempSrc: Oral Oral Oral Oral  SpO2: 90% 96% 97% 97%  Height:       Physical Exam General:well appearing male in NAD, sitting up in bed Heart:RRR, no mrg Lungs:mostly CTAB, +crackles in RLL Abdomen:soft, NTND Extremities:no LE edema Dialysis Access: LU AVG +b/t   Filed Weights    Intake/Output Summary (Last 24 hours) at 12/23/2020 1142 Last data filed at 12/23/2020 1007 Gross per 24 hour  Intake 290 ml  Output -500 ml  Net 790 ml    Additional Objective Labs: Basic Metabolic Panel: Recent Labs  Lab 12/22/20 0714 12/23/20 0119  NA 133* 135  K 4.4 3.4*  CL 92* 95*  CO2 25 25  GLUCOSE 82 66*  BUN 56* 29*  CREATININE 8.20* 4.91*  CALCIUM 8.8* 8.4*  PHOS  --  1.8*   Liver Function Tests: Recent Labs  Lab 12/22/20 0714 12/23/20 0119  AST 20  --   ALT 10  --   ALKPHOS 74  --   BILITOT 1.0  --   PROT 8.4*  --   ALBUMIN 2.4* 2.2*   CBC: Recent Labs  Lab 12/22/20 0714 12/23/20 0119  WBC 20.2* 19.7*  NEUTROABS 18.0*  --   HGB 9.4* 9.3*  HCT 32.0* 31.0*  MCV 82.5 80.7  PLT 215 228   Blood Culture    Component Value Date/Time   SDES BLOOD SITE NOT SPECIFIED 12/22/2020 0719   SPECREQUEST  12/22/2020 0719    BOTTLES DRAWN AEROBIC AND ANAEROBIC Blood Culture results may not be optimal due to an excessive volume of blood received in culture bottles   CULT STAPHYLOCOCCUS AUREUS (A) 12/22/2020 0719   REPTSTATUS PENDING 12/22/2020 0719   CBG: Recent Labs  Lab 12/22/20 1402 12/22/20 1804  GLUCAP 83 82   Iron Studies: No results for  input(s): IRON, TIBC, TRANSFERRIN, FERRITIN in the last 72 hours. Lab Results  Component Value Date   INR 1.3 (H) 12/22/2020   INR 1.5 (H) 10/14/2020   INR 1.2 10/13/2020   Studies/Results: CT Head Wo Contrast  Result Date: 12/22/2020 CLINICAL DATA:  Mental status changes of unknown cause.  Confusion. EXAM: CT HEAD WITHOUT CONTRAST TECHNIQUE: Contiguous axial images were obtained from the base of the skull through the vertex without intravenous contrast. COMPARISON:  September 28, 2020. FINDINGS: Brain: No evidence of acute infarction, hemorrhage, hydrocephalus, extra-axial collection or mass lesion/mass effect. Areas of suspected infarct seen on previous imaging not well demonstrated on the current exam. Area of hypodensity in the RIGHT frontal periventricular white matter on image 20 of series 4 appears to have been present on the prior study better and is demonstrated on the previous MRI. Vascular: No hyperdense vessel or unexpected calcification. Skull: Normal. Negative for fracture or focal lesion. Sinuses/Orbits: Visualized paranasal sinuses and orbits are unremarkable. Other: None. IMPRESSION: 1. No acute intracranial abnormality. 2. Subtle changes related to previously described areas of infarct better demonstrated on recent MRI. Electronically Signed   By: Zetta Bills M.D.   On:  12/22/2020 11:25   MR Lumbar Spine W Wo Contrast  Result Date: 12/22/2020 CLINICAL DATA:  Low back pain, possible infection EXAM: MRI LUMBAR SPINE WITHOUT AND WITH CONTRAST TECHNIQUE: Multiplanar and multiecho pulse sequences of the lumbar spine were obtained without and with intravenous contrast. CONTRAST:  7.56m GADAVIST GADOBUTROL 1 MMOL/ML IV SOLN COMPARISON:  Lumbar MRI 09/30/2020 FINDINGS: Despite efforts by the technologist and patient, motion artifact is present on today's exam and could not be eliminated. This reduces exam sensitivity and specificity. Segmentation: The lowest lumbar type non-rib-bearing  vertebra is labeled as L5. Alignment:  Unremarkable Vertebrae: Type 2 degenerative endplate findings at L075-GRMwith endplate irregularities, disc desiccation, and loss of disc height. This is mostly similar to the 09/30/2020 exam. There is also disc desiccation at L4-5 similar to prior. No significant abnormal degree of bony enhancement. Conus medullaris and cauda equina: Conus extends to the L1 level. Conus and cauda equina appear normal. Paraspinal and other soft tissues: Innumerable cysts of varying size and complexity scattered in both kidneys compatible with polycystic kidney disease. The kidneys are indistinct due to motion in today's exam was not optimized to assess the kidneys. Small retroperitoneal lymph nodes are not pathologically enlarged by size criteria. Disc levels: L1-2: Unremarkable. L2-3: Unremarkable. L3-4: Unremarkable. L4-5: Mild bilateral subarticular lateral recess stenosis due to disc bulge, minimally worsened from prior. L5-S1: Mild bilateral subarticular lateral recess stenosis and borderline bilateral foraminal stenosis due to intervertebral spurring, disc bulge, and facet arthropathy, similar to prior. IMPRESSION: 1. No findings of discitis or osteomyelitis. No compelling findings of epidural or psoas abscess. 2. Lumbar spondylosis and degenerative disc disease with mild impingement at L4-5 and L5-S1. Degenerative endplate findings particularly at L5-S1. 3. Autosomal dominant polycystic kidney disease. Electronically Signed   By: WVan ClinesM.D.   On: 12/22/2020 12:19   DG Chest Port 1 View  Result Date: 12/22/2020 CLINICAL DATA:  [age]year old male with possible sepsis. Dialysis patient. Weakness. EXAM: PORTABLE CHEST 1 VIEW COMPARISON:  Chest radiographs 12/13/2020 and earlier. FINDINGS: Portable AP semi upright view at 0734 hours. Lower lung volumes. Stable cardiomegaly and mediastinal contours. Visualized tracheal air column is within normal limits. No pneumothorax. Chronic  architectural distortion in the right lung with curvilinear opacity is stable. Mild hypo ventilation and atelectasis now at both lung bases. No consolidation. No pleural effusion is evident. Stable visualized osseous structures. Abundant chronic dystrophic calcification about the visible right shoulder. Chronic left subclavian vascular stent. Paucity of bowel gas in the upper abdomen. IMPRESSION: 1. Lower lung volumes with atelectasis. 2. Chronic cardiomegaly and right lung scarring. Electronically Signed   By: HGenevie AnnM.D.   On: 12/22/2020 07:50    Medications: . sodium chloride    . sodium chloride    . ceFEPime (MAXIPIME) IV 2 g (12/22/20 2054)   . (feeding supplement) PROSource Plus  30 mL Oral BID BM  . aspirin  325 mg Oral Daily  . calcium acetate  667 mg Oral TID WC  . Chlorhexidine Gluconate Cloth  6 each Topical Q0600  . cinacalcet  60 mg Oral Q breakfast  . docusate sodium  100 mg Oral BID  . feeding supplement (NEPRO CARB STEADY)  237 mL Oral TID BM  . heparin  5,000 Units Subcutaneous Q8H  . methocarbamol  500 mg Oral TID  . multivitamin with minerals  1 tablet Oral Daily  . oxyCODONE  15 mg Oral Q12H  . pantoprazole  40 mg Oral Daily  . pregabalin  75 mg Oral QHS  . senna-docusate  1 tablet Oral BID  . sevelamer carbonate  1,600 mg Oral TID WC  . sodium chloride flush  3 mL Intravenous Q12H  . vancomycin variable dose per unstable renal function (pharmacist dosing)   Does not apply See admin instructions    Dialysis Orders: MWF - [facility]  4hrs, BFR 350, DFR 500,  EDW 72kg, 2K/ 2Ca Profile 2  Access: LU AVG Heparin 5500 unit qHD Mircera 225 mcg q2wks - last 5/9 Venofer '100mg'$  qHD x5 - 1 completed  Assessment/Plan: 1.  AMS  - Improving. BC+ MRSA. CT head with no acute findings. MRI spine with no discitis/osteomyelitis.  2. MRSA bacteremia -  BC+ MRSA. WBC 19.7. tmax 100.8 overnight.  3. Back pain - MRI spine with no discitis/osteomyelitis, +degenerative disc disease  at L4-L5, L5-S1 4. Hypoxia - remains on 2L O2.  CXR yesterday suspicious for pulmonary edema but exam reassuring.  UF limited by hypotension and remains hypotensive today.  Hopefully BP improved tomorrow to facilitate volume removal.  5.  ESRD -  On HD MWF.  Orders written for HD tomorrow 1st shift.  6.  Hypertension/volume  - ongoing hypotension.  Continue to hold home meds for now.  Plan for HD 1st shift tomorrow and UF as tolerated.  7.  Anemia of CKD - Hgb 9.4 - ESA recently dosed.  Hold IV iron due to concern for possible acute infection. 8.  Secondary Hyperparathyroidism -  Ca in goal. Phos low - d/c binders.  Continue sensipar '60mg'$  qd.  Not on VDRA. 9.  Nutrition - Renal diet w/fluid restriction. Protein supplement.   Jen Mow, PA-C [facility] 12/23/2020,11:42 AM  LOS: 1 day

## 2020-12-23 NOTE — Progress Notes (Signed)
Patient refusing his Sensipar and Renvela at this time. Have contacted [place], [place] from nephrology about his questions.

## 2020-12-23 NOTE — Progress Notes (Signed)
   12/22/20 2325  Assess: MEWS Score  Temp (!) 100.8 F (38.2 C)  BP 120/78  Pulse Rate (!) 107  ECG Heart Rate (!) 110  Resp 18  Level of Consciousness Alert  SpO2 94 %  O2 Device Nasal Cannula  Patient Activity (if Appropriate) In bed  O2 Flow Rate (L/min) 2 L/min

## 2020-12-24 ENCOUNTER — Inpatient Hospital Stay (HOSPITAL_COMMUNITY): Payer: Medicaid Other

## 2020-12-24 ENCOUNTER — Encounter (HOSPITAL_COMMUNITY)
Admission: EM | Disposition: A | Discharge status: Skilled Nursing Facility | Payer: Self-pay | Source: Home / Self Care | Attending: Hospitalist

## 2020-12-24 ENCOUNTER — Inpatient Hospital Stay (HOSPITAL_COMMUNITY): Payer: Medicaid Other | Admitting: Anesthesiology

## 2020-12-24 ENCOUNTER — Encounter (HOSPITAL_COMMUNITY): Payer: Self-pay | Admitting: Internal Medicine

## 2020-12-24 DIAGNOSIS — R7881 Bacteremia: Secondary | ICD-10-CM

## 2020-12-24 DIAGNOSIS — Z952 Presence of prosthetic heart valve: Secondary | ICD-10-CM | POA: Diagnosis not present

## 2020-12-24 DIAGNOSIS — B182 Chronic viral hepatitis C: Secondary | ICD-10-CM | POA: Diagnosis not present

## 2020-12-24 DIAGNOSIS — N186 End stage renal disease: Secondary | ICD-10-CM | POA: Diagnosis not present

## 2020-12-24 DIAGNOSIS — B9561 Methicillin susceptible Staphylococcus aureus infection as the cause of diseases classified elsewhere: Secondary | ICD-10-CM | POA: Diagnosis not present

## 2020-12-24 DIAGNOSIS — Z89611 Acquired absence of right leg above knee: Secondary | ICD-10-CM | POA: Diagnosis not present

## 2020-12-24 HISTORY — PX: TEE WITHOUT CARDIOVERSION: SHX5443

## 2020-12-24 LAB — CBC WITH DIFFERENTIAL/PLATELET
Abs Immature Granulocytes: 0.29 10*3/uL — ABNORMAL HIGH (ref 0.00–0.07)
Basophils Absolute: 0.1 10*3/uL (ref 0.0–0.1)
Basophils Relative: 0 %
Eosinophils Absolute: 0.3 10*3/uL (ref 0.0–0.5)
Eosinophils Relative: 1 %
HCT: 32 % — ABNORMAL LOW (ref 39.0–52.0)
Hemoglobin: 9.7 g/dL — ABNORMAL LOW (ref 13.0–17.0)
Immature Granulocytes: 1 %
Lymphocytes Relative: 6 %
Lymphs Abs: 1.3 10*3/uL (ref 0.7–4.0)
MCH: 24.5 pg — ABNORMAL LOW (ref 26.0–34.0)
MCHC: 30.3 g/dL (ref 30.0–36.0)
MCV: 80.8 fL (ref 80.0–100.0)
Monocytes Absolute: 2.3 10*3/uL — ABNORMAL HIGH (ref 0.1–1.0)
Monocytes Relative: 11 %
Neutro Abs: 16.9 10*3/uL — ABNORMAL HIGH (ref 1.7–7.7)
Neutrophils Relative %: 81 %
Platelets: 230 10*3/uL (ref 150–400)
RBC: 3.96 MIL/uL — ABNORMAL LOW (ref 4.22–5.81)
RDW: 20.3 % — ABNORMAL HIGH (ref 11.5–15.5)
WBC: 21.1 10*3/uL — ABNORMAL HIGH (ref 4.0–10.5)
nRBC: 0 % (ref 0.0–0.2)

## 2020-12-24 LAB — CULTURE, BLOOD (ROUTINE X 2): Special Requests: ADEQUATE

## 2020-12-24 LAB — RENAL FUNCTION PANEL
Albumin: 2.2 g/dL — ABNORMAL LOW (ref 3.5–5.0)
Anion gap: 14 (ref 5–15)
BUN: 42 mg/dL — ABNORMAL HIGH (ref 6–20)
CO2: 27 mmol/L (ref 22–32)
Calcium: 9.2 mg/dL (ref 8.9–10.3)
Chloride: 92 mmol/L — ABNORMAL LOW (ref 98–111)
Creatinine, Ser: 6.83 mg/dL — ABNORMAL HIGH (ref 0.61–1.24)
GFR, Estimated: 9 mL/min — ABNORMAL LOW (ref >60)
Glucose, Bld: 106 mg/dL — ABNORMAL HIGH (ref 70–99)
Phosphorus: 1.8 mg/dL — ABNORMAL LOW (ref 2.5–4.6)
Potassium: 3.9 mmol/L (ref 3.5–5.1)
Sodium: 133 mmol/L — ABNORMAL LOW (ref 135–145)

## 2020-12-24 LAB — COMPREHENSIVE METABOLIC PANEL
ALT: 10 U/L (ref 0–44)
AST: 21 U/L (ref 15–41)
Albumin: 2.2 g/dL — ABNORMAL LOW (ref 3.5–5.0)
Alkaline Phosphatase: 79 U/L (ref 38–126)
Anion gap: 14 (ref 5–15)
BUN: 43 mg/dL — ABNORMAL HIGH (ref 6–20)
CO2: 27 mmol/L (ref 22–32)
Calcium: 9.1 mg/dL (ref 8.9–10.3)
Chloride: 92 mmol/L — ABNORMAL LOW (ref 98–111)
Creatinine, Ser: 6.91 mg/dL — ABNORMAL HIGH (ref 0.61–1.24)
GFR, Estimated: 9 mL/min — ABNORMAL LOW (ref >60)
Glucose, Bld: 105 mg/dL — ABNORMAL HIGH (ref 70–99)
Potassium: 3.9 mmol/L (ref 3.5–5.1)
Sodium: 133 mmol/L — ABNORMAL LOW (ref 135–145)
Total Bilirubin: 1 mg/dL (ref 0.3–1.2)
Total Protein: 8.6 g/dL — ABNORMAL HIGH (ref 6.5–8.1)

## 2020-12-24 LAB — PHOSPHORUS: Phosphorus: 1.8 mg/dL — ABNORMAL LOW (ref 2.5–4.6)

## 2020-12-24 LAB — HEPATITIS B SURFACE ANTIGEN: Hepatitis B Surface Ag: NONREACTIVE

## 2020-12-24 LAB — HEPATITIS B SURFACE ANTIBODY,QUALITATIVE: Hep B S Ab: REACTIVE — AB

## 2020-12-24 LAB — MAGNESIUM: Magnesium: 1.9 mg/dL (ref 1.7–2.4)

## 2020-12-24 LAB — VANCOMYCIN, RANDOM: Vancomycin Rm: 22

## 2020-12-24 SURGERY — ECHOCARDIOGRAM, TRANSESOPHAGEAL
Anesthesia: Monitor Anesthesia Care

## 2020-12-24 MED ORDER — LIDOCAINE 2% (20 MG/ML) 5 ML SYRINGE
INTRAMUSCULAR | Status: DC | PRN
  Administered 2020-12-24: 50 mg via INTRAVENOUS

## 2020-12-24 MED ORDER — SODIUM CHLORIDE 0.9 % IV SOLN: INTRAVENOUS | Status: DC

## 2020-12-24 MED ORDER — PROPOFOL 10 MG/ML IV BOLUS
INTRAVENOUS | Status: DC | PRN
  Administered 2020-12-24 (×2): 20 mg via INTRAVENOUS

## 2020-12-24 MED ORDER — METOPROLOL TARTRATE 5 MG/5ML IV SOLN
5.0000 mg | Freq: Once | INTRAVENOUS | Status: AC
  Administered 2020-12-24: 5 mg via INTRAVENOUS
  Filled 2020-12-24: qty 5

## 2020-12-24 MED ORDER — GENTAMICIN IN SALINE 1.6-0.9 MG/ML-% IV SOLN
80.0000 mg | INTRAVENOUS | Status: DC
  Administered 2020-12-27: 80 mg via INTRAVENOUS
  Filled 2020-12-24 (×2): qty 50

## 2020-12-24 MED ORDER — PROPOFOL 500 MG/50ML IV EMUL
INTRAVENOUS | Status: DC | PRN
  Administered 2020-12-24: 150 ug/kg/min via INTRAVENOUS

## 2020-12-24 MED ORDER — VANCOMYCIN HCL IN DEXTROSE 750-5 MG/150ML-% IV SOLN
750.0000 mg | INTRAVENOUS | Status: DC
  Administered 2020-12-24 – 2021-01-03 (×3): 750 mg via INTRAVENOUS
  Filled 2020-12-24 (×9): qty 150

## 2020-12-24 MED ORDER — BUTAMBEN-TETRACAINE-BENZOCAINE 2-2-14 % EX AERO
INHALATION_SPRAY | CUTANEOUS | Status: DC | PRN
  Administered 2020-12-24: 2 via TOPICAL

## 2020-12-24 MED ORDER — IBUPROFEN 600 MG PO TABS
600.0000 mg | ORAL_TABLET | Freq: Once | ORAL | Status: AC
  Administered 2020-12-24: 600 mg via ORAL
  Filled 2020-12-24: qty 1

## 2020-12-24 MED ORDER — GENTAMICIN IN SALINE 1.2-0.9 MG/ML-% IV SOLN
120.0000 mg | Freq: Once | INTRAVENOUS | Status: AC
  Administered 2020-12-24: 120 mg via INTRAVENOUS
  Filled 2020-12-24: qty 100

## 2020-12-24 NOTE — CV Procedure (Addendum)
TRANSESOPHAGEAL ECHOCARDIOGRAM (TEE) NOTE  INDICATIONS: infective endocarditis  PROCEDURE:   Informed consent was obtained prior to the procedure. The risks, benefits and alternatives for the procedure were discussed and the patient comprehended these risks.  Risks include, but are not limited to, cough, sore throat, vomiting, nausea, somnolence, esophageal and stomach trauma or perforation, bleeding, low blood pressure, aspiration, pneumonia, infection, trauma to the teeth and death.    After a procedural time-out, the patient was given propofol per anesthesia for moderate sedation.  The patient's heart rate, blood pressure, and oxygen saturation are monitored continuously during the procedure. The oropharynx was anesthetized with 2 topical cetacaine sprays.  The transesophageal probe was inserted in the esophagus and stomach without difficulty and multiple views were obtained.  The patient was kept under observation until the patient left the procedure room. I was present face-to-face 100% of this time. The patient left the procedure room in stable condition.   Agitated microbubble saline contrast was not administered.  COMPLICATIONS:    There were no immediate complications.  Findings:  1. LEFT VENTRICLE: The left ventricular wall thickness is moderately increased.  The left ventricular cavity is normal in size. Wall motion is normal.  LVEF is 60-65%.  2. RIGHT VENTRICLE:  The right ventricle is severely dilated without any thrombus or masses.    3. LEFT ATRIUM:  The left atrium is normal in size without any thrombus or masses.  There is not spontaneous echo contrast ("smoke") in the left atrium consistent with a low flow state.  4. LEFT ATRIAL APPENDAGE:  The large left atrial appendage is free of any thrombus or masses. The appendage has single lobes. Pulse doppler indicates high flow in the appendage.  5. ATRIAL SEPTUM:  The atrial septum appears intact and is free of thrombus  and/or masses.  There is no evidence for interatrial shunting by color doppler and saline microbubble.  6. RIGHT ATRIUM:  The right atrium is severely dilated in size and function without any thrombus or masses.  7. 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 There is  trivial regurgitation.  There is a possible small mobile vegetation adherent to the posterior leaflet which appears filamentous, this could also be residual valve tissue or suture.   8. AORTIC VALVE:  The aortic valve is trileaflet, normal in structure and function with no regurgitation.  The valve was visualized with 2D/3D modes - there appears to be a small mobile density at the base of the left coronary cusp seen in the long-axis view on the outflow side of the leaflet, possibly consistent with vegetation.  9. TRICUSPID VALVE:  The tricuspid valve is normal in structure and function with Mild regurgitation.  There were no vegetations or stenosis  10.  PULMONIC VALVE:  The pulmonic valve is normal in structure and function with trivial regurgitation.  There were no vegetations or stenosis.   11. AORTIC ARCH, ASCENDING AND DESCENDING AORTA:  There was grade 2 Ron Parker et. Al, 1992) atherosclerosis of the ascending aorta, aortic arch, or proximal descending aorta.  12. PULMONARY VEINS: Anomalous pulmonary venous return was not noted.  13. PERICARDIUM: The pericardium appeared normal and non-thickened.  There is no pericardial effusion.  IMPRESSION:   1. 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  2. No LAA thrombus 3. Negative for PFO 4. Moderate LVH 5. Severe RA/RV dilitation with mild TR 6. LVEF 60-65%  RECOMMENDATIONS:  1. Consider longer course of antibiotics for possible valvular vegetations, including a small, mobile filamentous structure on the posterior bioprosthetic valve leaflet which is concerning for vegetation.  Time  Spent Directly with the Patient:  45 minutes   Pixie Casino, MD, [facility], [place] Director of the Advanced Lipid Disorders &  Cardiovascular Risk Reduction Clinic Diplomate of the American Board of Clinical Lipidology Attending Cardiologist  Direct Dial: [phone]  Fax: [phone]  Website:  [email or web address] 12/24/2020, 1:55 PM

## 2020-12-24 NOTE — Progress Notes (Signed)
Pharmacy Antibiotic Note  Patrick Anthony is a 50 y.o. male admitted on 12/22/2020 with MRSA bacteremia. He was found to have possible small vegetations associated with the bioprosthetic mitral valve and native aortic valve on TEE today.  Pharmacy has been consulted for gentamicin dosing for prosthetic valve endocarditis. Patient is a HD patient with sessions on MWF. Last HD was this morning. Vd for Gent = 29.   Plan: Gentamicin 120 mg X 1  Then 80 mg every MWF after HD  Goal trough < 2  Monitor weekly level  Will also plan to add rifampin once blood cultures clear  Height: 6' (182.9 cm) Weight: 72.5 kg (159 lb 13.3 oz) IBW/kg (Calculated) : 77.6  Temp (24hrs), Avg:99.5 F (37.5 C), Min:97.7 F (36.5 C), Max:102.5 F (39.2 C)  Recent Labs  Lab 12/22/20 0714 12/23/20 0119 12/24/20 0347  WBC 20.2* 19.7* 21.1*  CREATININE 8.20* 4.91* 6.91*  6.83*  LATICACIDVEN 1.7  --   --   VANCORANDOM  --   --  22    Estimated Creatinine Clearance: 13.4 mL/min (A) (by C-G formula based on SCr of 6.83 mg/dL (H)).    No Known Allergies    Thank you for allowing pharmacy to be a part of this patient's care.  Jimmy Footman, PharmD, BCPS, BCIDP Infectious Diseases Clinical Pharmacist Phone: [phone] 12/24/2020 4:00 PM

## 2020-12-24 NOTE — Anesthesia Procedure Notes (Signed)
Procedure Name: MAC Date/Time: 12/24/2020 1:25 PM Performed by: Mariea Clonts, CRNA Pre-anesthesia Checklist: Patient identified, Emergency Drugs available, Suction available, Patient being monitored and Timeout performed Patient Re-evaluated:Patient Re-evaluated prior to induction Oxygen Delivery Method: Nasal cannula and Simple face mask Preoxygenation: Pre-oxygenation with 100% oxygen

## 2020-12-24 NOTE — Progress Notes (Signed)
[facility]  Date of Admission:  12/22/2020      Total days of antibiotics 2   Vancomycin    ASSESSMENT: Patrick Anthony is a 50 y.o. male with recurrent MRSA bacteremia. 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 L-spine MR normal but awaiting T spine study. Tried to coordinate with HD and radiology yesterday but not successful. We will change to non-contrasted MR study for now. Ongoing fevers are concerning. Will follow for clearance of blood cultures and TEE today to determine if we need to add rifampin for prosthetic valve involvement.   Will check inflammatory markers next lab draw.   Dr. Linus Salmons will follow up with him over the weekend.    PLAN: 1. Continue vancomycin  2. Change MR of t-spine to non-contrasted study  3. Follow TEE results today.  4. Follow blood cultures.    Principal Problem:   Bacteremia due to Staphylococcus aureus Active Problems:   Anemia in chronic kidney disease   Hepatitis C   ESRD on dialysis ([place])   S/P mitral valve replacement   S/P AKA (above knee amputation), right ([facility])   Personal history of Methicillin resistant Staphylococcus aureus infection   SIRS (systemic inflammatory response syndrome) ([place])   . (feeding supplement) PROSource Plus  30 mL Oral BID BM  . aspirin  325 mg Oral Daily  . Chlorhexidine Gluconate Cloth  6 each Topical Q0600  . Chlorhexidine Gluconate Cloth  6 each Topical Q0600  . cinacalcet  60 mg Oral Q supper  . docusate sodium  100 mg Oral BID  . feeding supplement (NEPRO CARB STEADY)  237 mL Oral TID BM  . heparin  5,000 Units Subcutaneous Q8H  . methocarbamol  500 mg Oral TID  . multivitamin with minerals  1 tablet Oral Daily  . oxyCODONE  15 mg Oral Q12H  . pantoprazole  40 mg Oral Daily  . pregabalin  75 mg Oral QHS  . senna-docusate  1 tablet Oral BID  . sodium chloride flush  3 mL Intravenous Q12H  .  vancomycin variable dose per unstable renal function (pharmacist dosing)   Does not apply See admin instructions    SUBJECTIVE: Still sweating and having fevers, not quite back to baseline re: confusion either. Forgetful.   Reports the back pain is still present but much better than previously.  Has not had t-spine MRI today as planned prior to HD.    Review of Systems: Review of Systems  Constitutional: Positive for diaphoresis, fever and malaise/fatigue.  Respiratory: Negative for cough and shortness of breath.   Cardiovascular: Negative for chest pain.  Musculoskeletal: Positive for back pain (improved ).  Skin: Negative for rash.  Neurological: Negative for dizziness, weakness and headaches.      No Known Allergies  OBJECTIVE: Vitals:   12/24/20 1000 12/24/20 1030 12/24/20 1100 12/24/20 1130  BP: 108/84 99/74 (!) 119/91 120/90  Pulse: 96 (!) 104 91 (!) 107  Resp:      Temp:      TempSrc:      SpO2:      Weight:      Height:       Body mass index is 22.84 kg/m.  Physical Exam Vitals reviewed.  Constitutional:      Appearance: Normal appearance. He is not ill-appearing.  HENT:     Mouth/Throat:     Mouth:  Mucous membranes are moist.     Pharynx: Oropharynx is clear.  Eyes:     General: No scleral icterus.    Pupils: Pupils are equal, round, and reactive to light.  Cardiovascular:     Rate and Rhythm: Normal rate and regular rhythm.     Heart sounds: No murmur heard.   Pulmonary:     Effort: Pulmonary effort is normal.     Breath sounds: Normal breath sounds.  Abdominal:     General: Bowel sounds are normal.     Palpations: Abdomen is soft.  Musculoskeletal:     Comments: R stump with scabbed incision. No warmth or fluctuance.   Skin:    General: Skin is warm and dry.     Capillary Refill: Capillary refill takes less than 2 seconds.  Neurological:     Mental Status: He is alert and oriented to person, place, and time.     Lab Results Lab Results   Component Value Date   WBC 21.1 (H) 12/24/2020   HGB 9.7 (L) 12/24/2020   HCT 32.0 (L) 12/24/2020   MCV 80.8 12/24/2020   PLT 230 12/24/2020    Lab Results  Component Value Date   CREATININE 6.83 (H) 12/24/2020   CREATININE 6.91 (H) 12/24/2020   BUN 42 (H) 12/24/2020   BUN 43 (H) 12/24/2020   NA 133 (L) 12/24/2020   NA 133 (L) 12/24/2020   K 3.9 12/24/2020   K 3.9 12/24/2020   CL 92 (L) 12/24/2020   CL 92 (L) 12/24/2020   CO2 27 12/24/2020   CO2 27 12/24/2020    Lab Results  Component Value Date   ALT 10 12/24/2020   AST 21 12/24/2020   ALKPHOS 79 12/24/2020   BILITOT 1.0 12/24/2020     Microbiology: Recent Results (from the past 240 hour(s))  Resp Panel by RT-PCR (Flu A&B, Covid) Nasopharyngeal Swab     Status: None   Collection Time: 12/22/20  7:14 AM   Specimen: Nasopharyngeal Swab; Nasopharyngeal(NP) swabs in vial transport medium  Result Value Ref Range Status   SARS Coronavirus 2 by RT PCR NEGATIVE NEGATIVE Final    Comment: (NOTE) SARS-CoV-2 target nucleic acids are NOT DETECTED.  The SARS-CoV-2 RNA is generally detectable in upper respiratory specimens during the acute phase of infection. The lowest concentration of SARS-CoV-2 viral copies this assay can detect is 138 copies/mL. A negative result does not preclude SARS-Cov-2 infection and should not be used as the sole basis for treatment or other patient management decisions. A negative result may occur with  improper specimen collection/handling, submission of specimen other than nasopharyngeal swab, presence of viral mutation(s) within the areas targeted by this assay, and inadequate number of viral copies(<138 copies/mL). A negative result must be combined with clinical observations, patient history, and epidemiological information. The expected result is Negative.  Fact Sheet for Patients:  EntrepreneurPulse.com.au  Fact Sheet for Healthcare Providers:   IncredibleEmployment.be  This test is no t yet approved or cleared by the [place] FDA and  has been authorized for detection and/or diagnosis of SARS-CoV-2 by FDA under an Emergency Use Authorization (EUA). This EUA will remain  in effect (meaning this test can be used) for the duration of the COVID-19 declaration under Section 564(b)(1) of the Act, 21 U.S.C.section 360bbb-3(b)(1), unless the authorization is terminated  or revoked sooner.       Influenza A by PCR NEGATIVE NEGATIVE Final   Influenza B by PCR NEGATIVE NEGATIVE Final  Comment: (NOTE) The Xpert Xpress SARS-CoV-2/FLU/RSV plus assay is intended as an aid in the diagnosis of influenza from Nasopharyngeal swab specimens and should not be used as a sole basis for treatment. Nasal washings and aspirates are unacceptable for Xpert Xpress SARS-CoV-2/FLU/RSV testing.  Fact Sheet for Patients: EntrepreneurPulse.com.au  Fact Sheet for Healthcare Providers: IncredibleEmployment.be  This test is not yet approved or cleared by the [place] FDA and has been authorized for detection and/or diagnosis of SARS-CoV-2 by FDA under an Emergency Use Authorization (EUA). This EUA will remain in effect (meaning this test can be used) for the duration of the COVID-19 declaration under Section 564(b)(1) of the Act, 21 U.S.C. section 360bbb-3(b)(1), unless the authorization is terminated or revoked.  Performed at [facility], [place] [address] [place], [place] [address]   Blood Culture (routine x 2)     Status: Abnormal   Collection Time: 12/22/20  7:14 AM   Specimen: BLOOD  Result Value Ref Range Status   Specimen Description BLOOD SITE NOT SPECIFIED  Final   Special Requests   Final    BOTTLES DRAWN AEROBIC AND ANAEROBIC Blood Culture adequate volume   Culture  Setup Time   Final    GRAM POSITIVE COCCI IN CLUSTERS IN BOTH AEROBIC AND ANAEROBIC  BOTTLES CRITICAL RESULT CALLED TO, READ BACK BY AND VERIFIED WITHDion Body [facility] 2028 12/22/20 A BROWNING Performed at [facility], [place] [address] [place], [place] [address]    Culture METHICILLIN RESISTANT STAPHYLOCOCCUS AUREUS (A)  Final   Report Status 12/24/2020 FINAL  Final   Organism ID, Bacteria METHICILLIN RESISTANT STAPHYLOCOCCUS AUREUS  Final      Susceptibility   Methicillin resistant staphylococcus aureus - MIC*    CIPROFLOXACIN 4 RESISTANT Resistant     ERYTHROMYCIN >=8 RESISTANT Resistant     GENTAMICIN <=0.5 SENSITIVE Sensitive     OXACILLIN >=4 RESISTANT Resistant     TETRACYCLINE <=1 SENSITIVE Sensitive     VANCOMYCIN 1 SENSITIVE Sensitive     TRIMETH/SULFA <=10 SENSITIVE Sensitive     CLINDAMYCIN <=0.25 SENSITIVE Sensitive     RIFAMPIN <=0.5 SENSITIVE Sensitive     Inducible Clindamycin NEGATIVE Sensitive     * METHICILLIN RESISTANT STAPHYLOCOCCUS AUREUS  Blood Culture ID Panel (Reflexed)     Status: Abnormal   Collection Time: 12/22/20  7:14 AM  Result Value Ref Range Status   Enterococcus faecalis NOT DETECTED NOT DETECTED Final   Enterococcus Faecium NOT DETECTED NOT DETECTED Final   Listeria monocytogenes NOT DETECTED NOT DETECTED Final   Staphylococcus species DETECTED (A) NOT DETECTED Final    Comment: CRITICAL RESULT CALLED TO, READ BACK BY AND VERIFIED WITH: J MILLEN PHARMD 2028 12/22/20 A BROWNING    Staphylococcus aureus (BCID) DETECTED (A) NOT DETECTED Final    Comment: Methicillin (oxacillin)-resistant Staphylococcus aureus (MRSA). MRSA is predictably resistant to beta-lactam antibiotics (except ceftaroline). Preferred therapy is vancomycin unless clinically contraindicated. Patient requires contact precautions if  hospitalized. CRITICAL RESULT CALLED TO, READ BACK BY AND VERIFIED WITH: Dion Body PHARMD 2028 12/22/20 A BROWNING    Staphylococcus epidermidis NOT DETECTED NOT DETECTED Final   Staphylococcus lugdunensis NOT DETECTED NOT DETECTED  Final   Streptococcus species NOT DETECTED NOT DETECTED Final   Streptococcus agalactiae NOT DETECTED NOT DETECTED Final   Streptococcus pneumoniae NOT DETECTED NOT DETECTED Final   Streptococcus pyogenes NOT DETECTED NOT DETECTED Final   A.calcoaceticus-baumannii NOT DETECTED NOT DETECTED Final   Bacteroides fragilis NOT DETECTED NOT DETECTED  Final   Enterobacterales NOT DETECTED NOT DETECTED Final   Enterobacter cloacae complex NOT DETECTED NOT DETECTED Final   Escherichia coli NOT DETECTED NOT DETECTED Final   Klebsiella aerogenes NOT DETECTED NOT DETECTED Final   Klebsiella oxytoca NOT DETECTED NOT DETECTED Final   Klebsiella pneumoniae NOT DETECTED NOT DETECTED Final   Proteus species NOT DETECTED NOT DETECTED Final   Salmonella species NOT DETECTED NOT DETECTED Final   Serratia marcescens NOT DETECTED NOT DETECTED Final   Haemophilus influenzae NOT DETECTED NOT DETECTED Final   Neisseria meningitidis NOT DETECTED NOT DETECTED Final   Pseudomonas aeruginosa NOT DETECTED NOT DETECTED Final   Stenotrophomonas maltophilia NOT DETECTED NOT DETECTED Final   Candida albicans NOT DETECTED NOT DETECTED Final   Candida auris NOT DETECTED NOT DETECTED Final   Candida glabrata NOT DETECTED NOT DETECTED Final   Candida krusei NOT DETECTED NOT DETECTED Final   Candida parapsilosis NOT DETECTED NOT DETECTED Final   Candida tropicalis NOT DETECTED NOT DETECTED Final   Cryptococcus neoformans/gattii NOT DETECTED NOT DETECTED Final   Meth resistant mecA/C and MREJ DETECTED (A) NOT DETECTED Final    Comment: CRITICAL RESULT CALLED TO, READ BACK BY AND VERIFIED WITHDion Body [facility] 2028 12/22/20 A BROWNING Performed at [facility], [address], [place], [place] [address]   Blood Culture (routine x 2)     Status: Abnormal   Collection Time: 12/22/20  7:19 AM   Specimen: BLOOD  Result Value Ref Range Status   Specimen Description BLOOD SITE NOT SPECIFIED  Final   Special Requests    Final    BOTTLES DRAWN AEROBIC AND ANAEROBIC Blood Culture results may not be optimal due to an excessive volume of blood received in culture bottles   Culture  Setup Time   Final    GRAM POSITIVE COCCI IN CLUSTERS IN BOTH AEROBIC AND ANAEROBIC BOTTLES IDENTIFICATION TO FOLLOW CRITICAL RESULT CALLED TO, READ BACK BY AND VERIFIED WITH: J MILLEN PHARMD 2028 12/22/20 A BROWNING    Culture (A)  Final    STAPHYLOCOCCUS AUREUS SUSCEPTIBILITIES PERFORMED ON PREVIOUS CULTURE WITHIN THE LAST 5 DAYS. Performed at [facility], [place] [address], [place], [place] [address]    Report Status 12/24/2020 FINAL  Final     Janene Madeira, MSN, NP-C [facility] [email or web address] Pager: [phone] Office: [phone] [place]: [phone]

## 2020-12-24 NOTE — Progress Notes (Addendum)
PROGRESS NOTE    Patrick Anthony  [id number] DOB: May 26, 1971 DOA: 12/22/2020 PCP: [name], MD   Brief Narrative:  The patient is a 50 atrial African-American male with a past medical history significant for but not limited to end-stage renal disease on hemodialysis on Monday Wednesday Friday, chronic diastolic CHF, hyperlipidemia, history of liver cirrhosis secondary to hepatitis C, history of alcohol abuse, OSA as well as recent AKA done by Dr. Sharol Given on 10/29/2020 and other comorbidities who presented with a chief complaint of spiking fevers for last few days and then altered mental status.  Reportedly patient was at his facility when they were noticing that he was altered and history is difficult to be obtained from the patient himself.  Of note he has had a prolonged hospitalization from 09/27/20 until April 6, 50 he was found to have MRSA bacteremia as well as mitral valve endocarditis with PFO and septic emboli to the brain along with that time.  He underwent a bioprosthetic mitral valve replacement along with closure of the PFO on 10/14/2020.  Also during that hospitalization he was treated for osteomyelitis and abscess of the right tibia status post AKA on 10/29/2020 by Dr. Sharol Given.  He was discharged to [facility] for continuation of vancomycin at the nursing facility until 11/25/2020.  Reportedly he is usually alert and oriented x4.  Yesterday he is only alert and oriented to person and place but not the situation.  He complained about severe pain in his back has been present for some time and he describes shooting pains in the right leg but was amputated.  He denies any shortness breath or cough.  In the ED he presented with a fever of 103 and rales were reported in the lower lung field on the right lung.  Upon arrival to the ED he was noted to be septic given that he had an elevated temperature, respirations as well as being tachycardia and had a new O2 requirement as well as being altered.   CT scan of the brain showed no acute abnormalities but did show several changes that were chronic injury to the areas of infarct noted on the previous MRI.  His WBC was elevated 20,200.  Chest x-ray showed scarring in the right upper lobe, middle lobe atelectasis and a small left-sided pleural effusion stable cardiomegaly.  Initial sepsis protocol was initiated and he was given IV vancomycin and cefepime.  Further work-up reveals that he has another MRSA bacteremia and infectious diseases is involved again and nephrology is involved as well.  Patient is to get a repeat TEE and this was done today 12/24/20 to evaluate his mitral valve. Still pending to get a MRI the T-spine with and without contrast with hemodialysis after.  Fortunately a TEE shows likely bioprosthetic valve infection so infectious disease is not going to add gentamicin and rifampin once his cultures are cleared.  We will still pursue his thoracic spine MRI which is still pending to be done.  Assessment & Plan:   Principal Problem:   Bacteremia due to Staphylococcus aureus Active Problems:   Anemia in chronic kidney disease   Hepatitis C   ESRD on dialysis (HCC)   S/P mitral valve replacement   S/P AKA (above knee amputation), right (HCC)   Personal history of Methicillin resistant Staphylococcus aureus infection   SIRS (systemic inflammatory response syndrome) (HCC)  Severe sepsis secondary to an MRSA bacteremia and Prostetic Valve Endocarditis present on admission -Presented the fever up to 103 F, tachycardia,  tachypnea, a new oxygen requirement, altered mental status as well as a leukocytosis of 20,200 and also with hypotension -Lactic acid level was reassuring at 1.7 on admission -Chest x-ray on admission only noted low lung volumes and possible signs of atelectasis with no clear source of infection appreciated at that time -Blood cultures now revealing an MRSA bacteremia; Repeat Blood Cx pending  -He has been admitted  progressive bed -He was initiated on IV antibiotics with vancomycin and cefepime but cefepime has now been discontinued -Infectious diseases has been consulted and plan further work-up for the patient and have ordered a repeat TEE and discussed with cardiology given his prostatic mitral valve and also will be getting a T-spine MRI with and without contrast with dialysis to be done afterwards; Unfortunately did not get MRI this AM -Patient did get TEE which showed "Possible small vegetations associated with the bioprosthetic mitral valve and native aortic valve. No LAA thrombus. Negative for PFO. Moderate LVH Severe RA/RV dilitation with mild TR LVEF 60-65%." -We will be continuing vancomycin but because of Concern for possible Valvular Vegatations including a small, moblile Filamentous Structure on the posterior bioprosthetic vavlve leaflet was concerning for vegetation, ID now recommending PVE Coverage and will start Gentamicin and Add Rifampin once CX's Clear -Repeat blood cultures have been planned for 12/24/2020 -Appreciate further ID evaluation recommendations; sepsis physiology was improving but patient continues to still spike Temperatures and becomes intermittently tachycardic and tachypenic  -Patient's WBC minimally improved and went from 20.2 -> 19.7 -> 21.1  Hypotension -In the setting of MRSA bacteremia his admission blood pressures were as low as 84/51 -Improved with IV fluid resuscitation -Continue to monitor blood pressures per protocol; blood pressures are still on the softer side but last blood pressure reading was 114/88 -His home blood pressure medications include clonidine 0.2 mg p.o. twice daily as well as metoprolol tartrate 50 mg p.o. twice daily which have both been held -Goal MAP bolus of at least 65 -Continue with IV fluid hydration as needed but will need to be judicious given that he is a dialysis patient  Acute metabolic encephalopathy in the setting of infection,  improved  -He is noted to be alert and oriented x2 but was previously alert and oriented x4 -CT scan did not show any acute abnormalities; he does have a prior history of mitral valve endocarditis with septic emboli to the brain and to the lung -He appears more appropriate today and appears closer to his baseline so we will hold off further investigation -Continue with neurochecks per protocol  ESRD on Hemodialysis Monday Wednesday  Secondary hyperparathyroidism -Patient has a history of poorly cystic kidney disease Normally dialyzes Monday Wednesday Friday -He was dialyzed following contrast yesterday and will be dialyzed again tomorrow after the MRI is done -Nephrology been consulted and appreciate further evaluation recommendations and they are planning for dialysis tomorrow first shift and he went early this AM -Patient's BUNs/creatinine went from 56/8.20 -> 29/4.91 -> 43/6.91 -Given that patient's calcium is at goal and his phosphorus is low they will discontinue binders but they recommended continue Cinacalcet 60 mg p.o. 4 times daily; will discontinue sevelamer carbonate 600 g p.o. 3 times daily  Hyponatremia -Mild. Na+ Went from 135 -> 133 -Expect to be corrected in Dialysis -Continue to Monitor and Trend -Repeat CMP in the AM   Acute Respiratory Failure with Hypoxia, improved  -Was placed on 2 L supplemental oxygen via nasal cannula and does not wear supplemental oxygen at home -Chest  x-ray yesterday showed "Lower lung volumes with atelectasis. Chronic cardiomegaly and right lung  Scarring." -Patient does have a history of MRSA bacteremia with septic emboli to the lungs -SpO2: 98 % O2 Flow Rate (L/min): 2 L/min -Continuous pulse oximetry maintain O2 saturations greater than 90% Continue supplemental oxygen via nasal cannula and wean O2 as tolerated -We will need a repeat chest x-ray and an ambulatory home O2 screen prior to discharge  History of MRSA bacteremia with mitral  valve endocarditis and PFO and septic emboli to the brain and left lung -He previously completed antibiotics on 11/25/2020 but now is back with another bacteremia as above so is now going to be on IV Vancomycin, IV Gentamicin, and IV Rifampin once Cx's clear   Anemia of chronic kidney disease -Patient presented with a hemoglobin 9.4 g/dL which appears near his baseline; his hemoglobin/hematocrit today is 9.7/32.0  -check anemia panel in the a.m. -Continue to monitor for signs and symptoms of bleeding; no overt bleeding noted -Repeat CBC in the a.m.  Hypokalemia -Mild -The patient is potassium went from 4.4 and is now 3.4 -Defer to nephrology to replete as-continue monitor and replete as necessary and check magnesium level in the a.m. -Repeat CMP in the a.m.  Chronic Pain Syndrome with Chronic Opiate Dependence -He reports chronic back pain as well as right lower extremity that was recently amputated -Resuming home regimen of OxyContin and hydromorphone as needed however given his back pain will need to rule out infection etiology and he will undergo an MRI with and without contrast in the morning  Status post right AKA -Recently had an AKA done in the last 2 months -Continue with Pregabalin 75 mg p.o. 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  GERD -Continue with PPI with pantoprazole 40 mg p.o. daily  DVT prophylaxis: Heparin 5000 units subcu every 8 Code Status: FULL CODE  Family Communication: No family present at bedside but spoke to Daughter [place] via telephone Disposition Plan: Pending further clinical work-up and evaluation by  infectious diseases as well as nephrology and evaluation of his mitral valve with a TEE  Status is: Inpatient  Remains inpatient appropriate because:Unsafe d/c plan, IV treatments appropriate due to intensity of illness or inability to take PO and Inpatient level of care appropriate due to severity of illness   Dispo: The patient is from: Home              Anticipated d/c is to: Home              Patient currently is not medically stable to d/c.   Difficult to place patient No  Consultants:   Infectious Diseases  Nephrology  Cardiology for TEE   Procedures:  TEE done by Dr. Debara Pickett  Findings:  1. LEFT VENTRICLE: The left ventricular wall thickness is moderately increased.  The left ventricular cavity is normal in size. Wall motion is normal.  LVEF is 60-65%.  2. RIGHT VENTRICLE:  The right ventricle is severely dilated without any thrombus or masses.    3. LEFT ATRIUM:  The left atrium is normal in size without any thrombus or masses.  There is not spontaneous echo contrast ("smoke") in the left  atrium consistent with a low flow state.  4. LEFT ATRIAL APPENDAGE:  The large left atrial appendage is free of any thrombus or masses. The appendage has single lobes. Pulse doppler indicates high flow in the appendage.  5. ATRIAL SEPTUM:  The atrial septum appears intact and is free of thrombus and/or masses.  There is no evidence for interatrial shunting by color doppler and saline microbubble.  6. RIGHT ATRIUM:  The right atrium is severely dilated in size and function without any thrombus or masses.  7. 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 There is  trivial regurgitation.  There is a possible small mobile vegetation adherent to the posterior leaflet which appears filamentous, this could also be residual valve tissue or suture.   8. AORTIC VALVE:  The aortic valve is trileaflet, normal in structure and function with no regurgitation.   The valve was visualized with 2D/3D modes - there appears to be a small mobile density at the base of the left coronary cusp seen in the long-axis view on the outflow side of the leaflet, possibly consistent with vegetation.  9. TRICUSPID VALVE:  The tricuspid valve is normal in structure and function with Mild regurgitation.  There were no vegetations or stenosis  10.  PULMONIC VALVE:  The pulmonic valve is normal in structure and function with trivial regurgitation.  There were no vegetations or stenosis.   11. AORTIC ARCH, ASCENDING AND DESCENDING AORTA:  There was grade 2 Ron Parker et. Al, 1992) atherosclerosis of the ascending aorta, aortic arch, or proximal descending aorta.  12. PULMONARY VEINS: Anomalous pulmonary venous return was not noted.  13. PERICARDIUM: The pericardium appeared normal and non-thickened.  There is no pericardial effusion.  IMPRESSION:   1. 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  2. No LAA thrombus 3. Negative for PFO 4. Moderate LVH 5. Severe RA/RV dilitation with mild TR 6. LVEF 60-65%  RECOMMENDATIONS:    1. Consider longer course of antibiotics for possible valvular vegetations, including a small, mobile filamentous structure on the posterior bioprosthetic valve leaflet which is concerning for vegetation.  Antimicrobials:  Anti-infectives (From admission, onward)   Start     Dose/Rate Route Frequency Ordered Stop   12/24/20 1430  vancomycin (VANCOCIN) IVPB 750 mg/150 ml premix        750 mg 150 mL/hr over 60 Minutes Intravenous Every M-W-F (Hemodialysis) 12/24/20 1324     12/22/20 1201  vancomycin variable dose per unstable renal function (pharmacist dosing)  Status:  Discontinued         Does not apply See admin instructions 12/22/20 1202 12/24/20 1324   12/22/20 1200  vancomycin (VANCOREADY) IVPB 750 mg/150 mL  Status:  Discontinued        750 mg 150 mL/hr over 60 Minutes Intravenous  Every M-W-F (Hemodialysis) 12/22/20 0845 12/22/20 1202   12/22/20 1200  ceFEPIme (MAXIPIME) 2 g in sodium chloride 0.9 % 100 mL IVPB  Status:  Discontinued        2 g 200 mL/hr over 30 Minutes Intravenous Every M-W-F (Hemodialysis) 12/22/20 0845 12/23/20 1450   12/22/20 0715  vancomycin (VANCOREADY) IVPB 1500 mg/300 mL        1,500 mg 150 mL/hr over 120 Minutes Intravenous  Once 12/22/20 0714 12/22/20 1034   12/22/20 0715  ceFEPIme (MAXIPIME) 2 g in sodium chloride 0.9 % 100 mL IVPB        2 g 200  mL/hr over 30 Minutes Intravenous  Once 12/22/20 [id number] 12/22/20 [id number]       Subjective: Seen and examined at bedside and he had come back from his TEE and was doing okay and eating his lunch.  He denies any nausea or vomiting.  Became febrile and diaphoretic yesterday.  Concerned about his bioprosthetic valve now having infection.  He denies any chest pain or shortness breath.  No other concerns at this time.  Objective: Vitals:   12/24/20 1405 12/24/20 1415 12/24/20 1425 12/24/20 1519  BP: 107/74 108/88 114/88   Pulse: 95 92 94   Resp: '15 13 14   '$ Temp:    98.5 F (36.9 C)  TempSrc:    Oral  SpO2: 98% 98% 98%   Weight:      Height:        Intake/Output Summary (Last 24 hours) at 12/24/2020 1554 Last data filed at 12/24/2020 1347 Gross per 24 hour  Intake 1090.78 ml  Output 3000 ml  Net -1909.22 ml   Filed Weights   12/24/20 0500 12/24/20 0755 12/24/20 1144  Weight: 77.3 kg 76.4 kg 72.5 kg   Examination: Physical Exam:  Constitutional: WN/WD AAM in NAD and appears calm and comfortable Eyes: Lids and conjunctivae normal, sclerae anicteric  ENMT: External Ears, Nose appear normal. Grossly normal hearing.  Neck: Appears normal, supple, no cervical masses, normal ROM, no appreciable thyromegaly; no JVD Respiratory: Diminished to auscultation bilaterally, no wheezing, rales, rhonchi or crackles. Normal respiratory effort and patient is not tachypenic. No accessory muscle use. Not  wearing supplemental O2 via [place] Cardiovascular: Tachycardic Rate, no murmurs / rubs / gallops. S1 and S2 auscultated. No appreciable LE edema on Left Leg Abdomen: Soft, non-tender, Mildly distended. Bowel sounds positive.  GU: Deferred. Musculoskeletal: No clubbing / cyanosis of digits/nails. Has a Right AKA Skin: No rashes, lesions, ulcers. No induration; Warm and dry.  Neurologic: CN 2-12 grossly intact with no focal deficits. Romberg sign and cerebellar reflexes not assessed.  Psychiatric: Normal judgment and insight. Alert and oriented x 3. Normal mood and appropriate affect.   Data Reviewed: I have personally reviewed following labs and imaging studies  CBC: Recent Labs  Lab 12/22/20 0714 12/23/20 0119 12/24/20 0347  WBC 20.2* 19.7* 21.1*  NEUTROABS 18.0*  --  16.9*  HGB 9.4* 9.3* 9.7*  HCT 32.0* 31.0* 32.0*  MCV 82.5 80.7 80.8  PLT 215 228 [id number]   Basic Metabolic Panel: Recent Labs  Lab 12/22/20 0714 12/23/20 0119 12/24/20 0347  NA 133* 135 133*  133*  K 4.4 3.4* 3.9  3.9  CL 92* 95* 92*  92*  CO2 '25 25 27  27  '$ GLUCOSE 82 66* 105*  106*  BUN 56* 29* 43*  42*  CREATININE 8.20* 4.91* 6.91*  6.83*  CALCIUM 8.8* 8.4* 9.1  9.2  MG  --   --  1.9  PHOS  --  1.8* 1.8*  1.8*   GFR: Estimated Creatinine Clearance: 13.4 mL/min (A) (by C-G formula based on SCr of 6.83 mg/dL (H)). Liver Function Tests: Recent Labs  Lab 12/22/20 0714 12/23/20 0119 12/24/20 0347  AST 20  --  21  ALT 10  --  10  ALKPHOS 74  --  79  BILITOT 1.0  --  1.0  PROT 8.4*  --  8.6*  ALBUMIN 2.4* 2.2* 2.2*  2.2*   No results for input(s): LIPASE, AMYLASE in the last 168 hours. No results for input(s): AMMONIA in the last 168 hours.  Coagulation Profile: Recent Labs  Lab 12/22/20 0714  INR 1.3*   Cardiac Enzymes: No results for input(s): CKTOTAL, CKMB, CKMBINDEX, TROPONINI in the last 168 hours. BNP (last 3 results) No results for input(s): PROBNP in the last 8760  hours. HbA1C: No results for input(s): HGBA1C in the last 72 hours. CBG: Recent Labs  Lab 12/22/20 1402 12/22/20 1804  GLUCAP 83 82   Lipid Profile: No results for input(s): CHOL, HDL, LDLCALC, TRIG, CHOLHDL, LDLDIRECT in the last 72 hours. Thyroid Function Tests: No results for input(s): TSH, T4TOTAL, FREET4, T3FREE, THYROIDAB in the last 72 hours. Anemia Panel: No results for input(s): VITAMINB12, FOLATE, FERRITIN, TIBC, IRON, RETICCTPCT in the last 72 hours. Sepsis Labs: Recent Labs  Lab 12/22/20 [id number]  LATICACIDVEN 1.7    Recent Results (from the past 240 hour(s))  Resp Panel by RT-PCR (Flu A&B, Covid) Nasopharyngeal Swab     Status: None   Collection Time: 12/22/20  7:14 AM   Specimen: Nasopharyngeal Swab; Nasopharyngeal(NP) swabs in vial transport medium  Result Value Ref Range Status   SARS Coronavirus 2 by RT PCR NEGATIVE NEGATIVE Final    Comment: (NOTE) SARS-CoV-2 target nucleic acids are NOT DETECTED.  The SARS-CoV-2 RNA is generally detectable in upper respiratory specimens during the acute phase of infection. The lowest concentration of SARS-CoV-2 viral copies this assay can detect is 138 copies/mL. A negative result does not preclude SARS-Cov-2 infection and should not be used as the sole basis for treatment or other patient management decisions. A negative result may occur with  improper specimen collection/handling, submission of specimen other than nasopharyngeal swab, presence of viral mutation(s) within the areas targeted by this assay, and inadequate number of viral copies(<138 copies/mL). A negative result must be combined with clinical observations, patient history, and epidemiological information. The expected result is Negative.  Fact Sheet for Patients:  EntrepreneurPulse.com.au  Fact Sheet for Healthcare Providers:  IncredibleEmployment.be  This test is no t yet approved or cleared by the [place] FDA  and  has been authorized for detection and/or diagnosis of SARS-CoV-2 by FDA under an Emergency Use Authorization (EUA). This EUA will remain  in effect (meaning this test can be used) for the duration of the COVID-19 declaration under Section 564(b)(1) of the Act, 21 U.S.C.section 360bbb-3(b)(1), unless the authorization is terminated  or revoked sooner.       Influenza A by PCR NEGATIVE NEGATIVE Final   Influenza B by PCR NEGATIVE NEGATIVE Final    Comment: (NOTE) The Xpert Xpress SARS-CoV-2/FLU/RSV plus assay is intended as an aid in the diagnosis of influenza from Nasopharyngeal swab specimens and should not be used as a sole basis for treatment. Nasal washings and aspirates are unacceptable for Xpert Xpress SARS-CoV-2/FLU/RSV testing.  Fact Sheet for Patients: EntrepreneurPulse.com.au  Fact Sheet for Healthcare Providers: IncredibleEmployment.be  This test is not yet approved or cleared by the [place] FDA and has been authorized for detection and/or diagnosis of SARS-CoV-2 by FDA under an Emergency Use Authorization (EUA). This EUA will remain in effect (meaning this test can be used) for the duration of the COVID-19 declaration under Section 564(b)(1) of the Act, 21 U.S.C. section 360bbb-3(b)(1), unless the authorization is terminated or revoked.  Performed at [facility], [place] [address], [place], [place] [address]   Blood Culture (routine x 2)     Status: Abnormal   Collection Time: 12/22/20  7:14 AM   Specimen: BLOOD  Result Value Ref Range Status   Specimen  Description BLOOD SITE NOT SPECIFIED  Final   Special Requests   Final    BOTTLES DRAWN AEROBIC AND ANAEROBIC Blood Culture adequate volume   Culture  Setup Time   Final    GRAM POSITIVE COCCI IN CLUSTERS IN BOTH AEROBIC AND ANAEROBIC BOTTLES CRITICAL RESULT CALLED TO, READ BACK BY AND VERIFIED WITHDion Body [facility] 2028 12/22/20 A BROWNING Performed at [facility], [place] [address] [place], [place] [address]    Culture METHICILLIN RESISTANT STAPHYLOCOCCUS AUREUS (A)  Final   Report Status 12/24/2020 FINAL  Final   Organism ID, Bacteria METHICILLIN RESISTANT STAPHYLOCOCCUS AUREUS  Final      Susceptibility   Methicillin resistant staphylococcus aureus - MIC*    CIPROFLOXACIN 4 RESISTANT Resistant     ERYTHROMYCIN >=8 RESISTANT Resistant     GENTAMICIN <=0.5 SENSITIVE Sensitive     OXACILLIN >=4 RESISTANT Resistant     TETRACYCLINE <=1 SENSITIVE Sensitive     VANCOMYCIN 1 SENSITIVE Sensitive     TRIMETH/SULFA <=10 SENSITIVE Sensitive     CLINDAMYCIN <=0.25 SENSITIVE Sensitive     RIFAMPIN <=0.5 SENSITIVE Sensitive     Inducible Clindamycin NEGATIVE Sensitive     * METHICILLIN RESISTANT STAPHYLOCOCCUS AUREUS  Blood Culture ID Panel (Reflexed)     Status: Abnormal   Collection Time: 12/22/20  7:14 AM  Result Value Ref Range Status   Enterococcus faecalis NOT DETECTED NOT DETECTED Final   Enterococcus Faecium NOT DETECTED NOT DETECTED Final   Listeria monocytogenes NOT DETECTED NOT DETECTED Final   Staphylococcus species DETECTED (A) NOT DETECTED Final    Comment: CRITICAL RESULT CALLED TO, READ BACK BY AND VERIFIED WITH: J MILLEN PHARMD 2028 12/22/20 A BROWNING    Staphylococcus aureus (BCID) DETECTED (A) NOT DETECTED Final    Comment: Methicillin (oxacillin)-resistant Staphylococcus aureus (MRSA). MRSA is predictably resistant to beta-lactam antibiotics (except ceftaroline). Preferred therapy is vancomycin unless clinically contraindicated. Patient requires contact precautions if  hospitalized. CRITICAL RESULT CALLED TO, READ BACK BY AND VERIFIED WITH: Dion Body PHARMD 2028 12/22/20 A BROWNING    Staphylococcus epidermidis NOT DETECTED NOT DETECTED Final   Staphylococcus lugdunensis NOT DETECTED NOT DETECTED Final   Streptococcus species NOT DETECTED NOT DETECTED Final   Streptococcus agalactiae NOT DETECTED NOT DETECTED Final    Streptococcus pneumoniae NOT DETECTED NOT DETECTED Final   Streptococcus pyogenes NOT DETECTED NOT DETECTED Final   A.calcoaceticus-baumannii NOT DETECTED NOT DETECTED Final   Bacteroides fragilis NOT DETECTED NOT DETECTED Final   Enterobacterales NOT DETECTED NOT DETECTED Final   Enterobacter cloacae complex NOT DETECTED NOT DETECTED Final   Escherichia coli NOT DETECTED NOT DETECTED Final   Klebsiella aerogenes NOT DETECTED NOT DETECTED Final   Klebsiella oxytoca NOT DETECTED NOT DETECTED Final   Klebsiella pneumoniae NOT DETECTED NOT DETECTED Final   Proteus species NOT DETECTED NOT DETECTED Final   Salmonella species NOT DETECTED NOT DETECTED Final   Serratia marcescens NOT DETECTED NOT DETECTED Final   Haemophilus influenzae NOT DETECTED NOT DETECTED Final   Neisseria meningitidis NOT DETECTED NOT DETECTED Final   Pseudomonas aeruginosa NOT DETECTED NOT DETECTED Final   Stenotrophomonas maltophilia NOT DETECTED NOT DETECTED Final   Candida albicans NOT DETECTED NOT DETECTED Final   Candida auris NOT DETECTED NOT DETECTED Final   Candida glabrata NOT DETECTED NOT DETECTED Final   Candida krusei NOT DETECTED NOT DETECTED Final   Candida parapsilosis NOT DETECTED NOT DETECTED Final   Candida tropicalis NOT DETECTED NOT DETECTED Final  Cryptococcus neoformans/gattii NOT DETECTED NOT DETECTED Final   Meth resistant mecA/C and MREJ DETECTED (A) NOT DETECTED Final    Comment: CRITICAL RESULT CALLED TO, READ BACK BY AND VERIFIED WITHDion Body [facility] 2028 12/22/20 A BROWNING Performed at [facility], [place] [address], [place], [place] [address]   Blood Culture (routine x 2)     Status: Abnormal   Collection Time: 12/22/20  7:19 AM   Specimen: BLOOD  Result Value Ref Range Status   Specimen Description BLOOD SITE NOT SPECIFIED  Final   Special Requests   Final    BOTTLES DRAWN AEROBIC AND ANAEROBIC Blood Culture results may not be optimal due to an excessive volume of blood  received in culture bottles   Culture  Setup Time   Final    GRAM POSITIVE COCCI IN CLUSTERS IN BOTH AEROBIC AND ANAEROBIC BOTTLES IDENTIFICATION TO FOLLOW CRITICAL RESULT CALLED TO, READ BACK BY AND VERIFIED WITH: J MILLEN PHARMD 2028 12/22/20 A BROWNING    Culture (A)  Final    STAPHYLOCOCCUS AUREUS SUSCEPTIBILITIES PERFORMED ON PREVIOUS CULTURE WITHIN THE LAST 5 DAYS. Performed at [facility], [place] [address], [place], [place] [address]    Report Status 12/24/2020 FINAL  Final     RN Pressure Injury Documentation:     Estimated body mass index is 21.68 kg/m as calculated from the following:   Height as of this encounter: 6' (1.829 m).   Weight as of this encounter: 72.5 kg.  Malnutrition Type:   Malnutrition Characteristics:   Nutrition Interventions:   Radiology Studies: ECHO TEE  Result Date: 12/24/2020    TRANSESOPHOGEAL ECHO REPORT   Patient Name:   LORAINE FERRISS Date of Exam: 12/24/2020 Medical Rec #:  [id number]      Height:       72.0 in Accession #:    [id number]     Weight:       159.8 lb Date of Birth:  1971-06-01      BSA:          1.937 m Patient Age:    1 years       BP:           155/91 mmHg Patient Gender: M              HR:           95 bpm. Exam Location:  Inpatient Procedure: 2D Echo, Cardiac Doppler, Color Doppler and Transesophageal Echo Indications:     Bacteremia  History:         Patient has prior history of Echocardiogram examinations, most                  recent 12/22/2020.                   Mitral Valve: bioprosthetic valve valve is present in the                  mitral position.  Sonographer:     Merrie Roof RDCS Referring Phys:  [id number] [place] [facility] Diagnosing Phys: Lyman Bishop MD PROCEDURE: After discussion of the risks and benefits of a TEE, an informed consent was obtained from the patient. The transesophogeal probe was passed without difficulty through the esophogus of the patient. Local oropharyngeal anesthetic was provided with  Cetacaine. Sedation performed by different physician. The patient was monitored while under deep sedation. The patient's vital signs; including heart rate, blood pressure, and oxygen  saturation; remained stable throughout the procedure. The patient developed no complications during the procedure. IMPRESSIONS  1. Left ventricular ejection fraction, by estimation, is 60 to 65%. The left ventricle has normal function. The left ventricle has no regional wall motion abnormalities. There is moderate left ventricular hypertrophy.  2. Right ventricular systolic function is normal. The right ventricular size is severely enlarged. Mildly increased right ventricular wall thickness.  3. No left atrial/left atrial appendage thrombus was detected.  4. Right atrial size was severely dilated.  5. The mitral valve has been repaired/replaced. Trivial mitral valve regurgitation. There is a bioprosthetic valve present in the mitral position. Echo findings are consistent with vegetation the mitral prosthesis.  6. Aortic valve vegetation is visualized on the left.  7. The aortic valve is tricuspid. Aortic valve regurgitation is not visualized.  8. There is mild (Grade II) plaque. Conclusion(s)/Recommendation(s): Findings are concerning for vegetation/infective endocarditis as detailed above. FINDINGS  Left Ventricle: Left ventricular ejection fraction, by estimation, is 60 to 65%. The left ventricle has normal function. The left ventricle has no regional wall motion abnormalities. The left ventricular internal cavity size was normal in size. There is  moderate left ventricular hypertrophy. Right Ventricle: The right ventricular size is severely enlarged. Mildly increased right ventricular wall thickness. Right ventricular systolic function is normal. Left Atrium: Left atrial size was normal in size. No left atrial/left atrial appendage thrombus was detected. Right Atrium: Right atrial size was severely dilated. Pericardium: There is no  evidence of pericardial effusion. Mitral Valve: The mitral valve has been repaired/replaced. Trivial mitral valve regurgitation. There is a bioprosthetic valve present in the mitral position. Echo findings are consistent with vegetation on the mitral prosthesis. Tricuspid Valve: The tricuspid valve is grossly normal. Tricuspid valve regurgitation is mild. Aortic Valve: The aortic valve is tricuspid. Aortic valve regurgitation is not visualized. A mobile vegetation is seen on the left. The AoV vegetation measures 5 mm x 5 mm. Pulmonic Valve: The pulmonic valve was grossly normal. Pulmonic valve regurgitation is trivial. Aorta: The aortic root and ascending aorta are structurally normal, with no evidence of dilitation. There is mild (Grade II) plaque. IAS/Shunts: No atrial level shunt detected by color flow Doppler. Lyman Bishop MD Electronically signed by Lyman Bishop MD Signature Date/Time: 12/24/2020/3:38:39 PM    Final    Scheduled Meds: . (feeding supplement) PROSource Plus  30 mL Oral BID BM  . aspirin  325 mg Oral Daily  . Chlorhexidine Gluconate Cloth  6 each Topical Q0600  . Chlorhexidine Gluconate Cloth  6 each Topical Q0600  . cinacalcet  60 mg Oral Q supper  . docusate sodium  100 mg Oral BID  . feeding supplement (NEPRO CARB STEADY)  237 mL Oral TID BM  . heparin  5,000 Units Subcutaneous Q8H  . methocarbamol  500 mg Oral TID  . multivitamin with minerals  1 tablet Oral Daily  . oxyCODONE  15 mg Oral Q12H  . pantoprazole  40 mg Oral Daily  . pregabalin  75 mg Oral QHS  . senna-docusate  1 tablet Oral BID  . sodium chloride flush  3 mL Intravenous Q12H   Continuous Infusions: . vancomycin      LOS: 2 days   Kerney Elbe, DO [facility] PAGER is on AMION  If 7PM-7AM, please contact night-coverage [email or web address]

## 2020-12-24 NOTE — Progress Notes (Signed)
Notified by RN that patient has developed blurry vision.  He reports he is looking DVTs to TVs and his vision is blurry.  Since began within the last 20 to 30 minutes.  He also is a heart rate increased from 100 to the 120-130 range.  Denies chest pain.  EKG is pending. Had TEE earlier today that showed valvular vegetations Will obtain MRI to make sure patient did not throw some vegetation and have a stroke causing blurry vision. EKG is being obtained and will be reviewed. Patient is hemodynamically stable

## 2020-12-24 NOTE — Anesthesia Preprocedure Evaluation (Signed)
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   breath sounds clear to auscultation       Cardiovascular hypertension, +CHF  + Valvular Problems/Murmurs  Rhythm:regular Rate:Normal  S/p mini MVR   Neuro/Psych CVA    GI/Hepatic (+) Cirrhosis       ,   Endo/Other    Renal/GU ESRF and DialysisRenal disease     Musculoskeletal   Abdominal   Peds  Hematology  (+) Blood dyscrasia, anemia ,   Anesthesia Other Findings   Reproductive/Obstetrics                             Anesthesia Physical Anesthesia Plan  ASA: IV  Anesthesia Plan: MAC   Post-op Pain Management:    Induction: Intravenous  PONV Risk Score and Plan: 1 and Propofol infusion and Treatment may vary due to age or medical condition  Airway Management Planned: Nasal Cannula  Additional Equipment:   Intra-op Plan:   Post-operative Plan:   Informed Consent: I have reviewed the patients History and Physical, chart, labs and discussed the procedure including the risks, benefits and alternatives for the proposed anesthesia with the patient or authorized representative who has indicated his/her understanding and acceptance.     Dental advisory given  Plan Discussed with: CRNA, Anesthesiologist and Surgeon  Anesthesia Plan Comments:         Anesthesia Quick Evaluation

## 2020-12-24 NOTE — Interval H&P Note (Signed)
History and Physical Interval Note:  12/24/2020 1:25 PM  Patrick Anthony  has presented today for surgery, with the diagnosis of BACTERIMIA.  The various methods of treatment have been discussed with the patient and family. After consideration of risks, benefits and other options for treatment, the patient has consented to  Procedure(s): TRANSESOPHAGEAL ECHOCARDIOGRAM (TEE) (N/A) as a surgical intervention.  The patient's history has been reviewed, patient examined, no change in status, stable for surgery.  I have reviewed the patient's chart and labs.  Questions were answered to the patient's satisfaction.     Pixie Casino

## 2020-12-24 NOTE — Transfer of Care (Signed)
Immediate Anesthesia Transfer of Care Note  Patient: Patrick Anthony  Procedure(s) Performed: TRANSESOPHAGEAL ECHOCARDIOGRAM (TEE) (N/A )  Patient Location: Endoscopy Unit  Anesthesia Type:MAC  Level of Consciousness: awake, alert  and oriented  Airway & Oxygen Therapy: Patient Spontanous Breathing and Patient connected to nasal cannula oxygen  Post-op Assessment: Report given to RN, Post -op Vital signs reviewed and stable and Patient moving all extremities X 4  Post vital signs: Reviewed and stable  Last Vitals:  Vitals Value Taken Time  BP    Temp    Pulse    Resp    SpO2      Last Pain:  Vitals:   12/24/20 1249  TempSrc: Oral  PainSc: 0-No pain      Patients Stated Pain Goal: 4 (50/27/74 1287)  Complications: No complications documented.

## 2020-12-24 NOTE — Plan of Care (Signed)

## 2020-12-24 NOTE — Progress Notes (Addendum)
  Echocardiogram TEE has been performed  [name] 12/24/2020, 2:07 PM

## 2020-12-24 NOTE — Progress Notes (Signed)
Report given to dialysis RN

## 2020-12-24 NOTE — Progress Notes (Signed)
Pharmacy Antibiotic Note  Patrick Anthony is a 50 y.o. male admitted on 12/22/2020 with pneumonia.  Pharmacy has been consulted for cefepime and vancomycin dosing.  From [place] - has been having fevers and confusion. Known HD patient - usually MWF. WBC 20.2, temp 103.   Pt has been on IV vanc for his bacteremia. A random vanc level was done this AM and it came back at 22 prior to his HD session. This is appropriate so we will start his maintenance dose today.    Plan: Vanc '750mg'$  IV MWF   Height: 6' (182.9 cm) Weight: 72.5 kg (159 lb 13.3 oz) IBW/kg (Calculated) : 77.6  Temp (24hrs), Avg:99.7 F (37.6 C), Min:97.7 F (36.5 C), Max:102.5 F (39.2 C)  Recent Labs  Lab 12/22/20 0714 12/23/20 0119 12/24/20 0347  WBC 20.2* 19.7* 21.1*  CREATININE 8.20* 4.91* 6.91*  6.83*  LATICACIDVEN 1.7  --   --   VANCORANDOM  --   --  22    Estimated Creatinine Clearance: 13.4 mL/min (A) (by C-G formula based on SCr of 6.83 mg/dL (H)).    No Known Allergies  Antimicrobials this admission: Vancomycin 5/18>> Cefepime 5/18>>5/18  5/20 VR  22  Dose adjustments this admission: N/A  Microbiology results: 5/18BCx: MRSA 5/18UCx: sent  5/20 blood>> 5/18 COVIDPCR:neg  Onnie Boer, PharmD, [place], AAHIVP, CPP Infectious Disease Pharmacist 12/24/2020 1:31 PM

## 2020-12-24 NOTE — Progress Notes (Signed)
[facility] Progress Note   Subjective:   Patient seen and examined at bedside n dialysis.  Reports confusion especially yesterday but still feeling like he cant quite get his thoughts together/remember everything going on.  Alert and oriented x3 but requires redirection when asking question and doesn't remember parts of earlier conversation.  Denies CP, SOB, abdominal pain, v/d, weakness, dizziness and fatigue. Continues to have back pain but currently well controlled. Admits to mild stomach pain and nausea earlier today.  Objective Vitals:   12/24/20 0830 12/24/20 0900 12/24/20 0930 12/24/20 1000  BP: 123/85 (!) 122/91 122/81 108/84  Pulse: 100 (!) 107 (!) 104 96  Resp: 18 18    Temp:      TempSrc:      SpO2:      Weight:      Height:       Physical Exam General: alert, oriented x3, male patient in NAD, sitting up in bed Heart:RRR, no mrg Lungs:mostly CTAB, +crackles in RLL, ml WOB on RA Abdomen:soft, NTND Extremities: R BKA, no edema Dialysis Access: LU AVG in use   Filed Weights   12/23/20 1458 12/24/20 0500 12/24/20 0755  Weight: 74.4 kg 77.3 kg 76.4 kg    Intake/Output Summary (Last 24 hours) at 12/24/2020 1012 Last data filed at 12/23/2020 2100 Gross per 24 hour  Intake 1080.78 ml  Output --  Net 1080.78 ml    Additional Objective Labs: Basic Metabolic Panel: Recent Labs  Lab 12/22/20 0714 12/23/20 0119 12/24/20 0347  NA 133* 135 133*  133*  K 4.4 3.4* 3.9  3.9  CL 92* 95* 92*  92*  CO2 '25 25 27  27  '$ GLUCOSE 82 66* 105*  106*  BUN 56* 29* 43*  42*  CREATININE 8.20* 4.91* 6.91*  6.83*  CALCIUM 8.8* 8.4* 9.1  9.2  PHOS  --  1.8* 1.8*  1.8*   Liver Function Tests: Recent Labs  Lab 12/22/20 0714 12/23/20 0119 12/24/20 0347  AST 20  --  21  ALT 10  --  10  ALKPHOS 74  --  79  BILITOT 1.0  --  1.0  PROT 8.4*  --  8.6*  ALBUMIN 2.4* 2.2* 2.2*  2.2*   CBC: Recent Labs  Lab 12/22/20 0714 12/23/20 0119 12/24/20 0347  WBC  20.2* 19.7* 21.1*  NEUTROABS 18.0*  --  16.9*  HGB 9.4* 9.3* 9.7*  HCT 32.0* 31.0* 32.0*  MCV 82.5 80.7 80.8  PLT 215 228 230   Blood Culture    Component Value Date/Time   SDES BLOOD SITE NOT SPECIFIED 12/22/2020 0719   SPECREQUEST  12/22/2020 0719    BOTTLES DRAWN AEROBIC AND ANAEROBIC Blood Culture results may not be optimal due to an excessive volume of blood received in culture bottles   CULT (A) 12/22/2020 0719    STAPHYLOCOCCUS AUREUS SUSCEPTIBILITIES PERFORMED ON PREVIOUS CULTURE WITHIN THE LAST 5 DAYS. Performed at [facility], [place] [address] [place], [place] [address]    REPTSTATUS 12/24/2020 FINAL 12/22/2020 0719    Studies/Results: CT Head Wo Contrast  Result Date: 12/22/2020 CLINICAL DATA:  Mental status changes of unknown cause.  Confusion. EXAM: CT HEAD WITHOUT CONTRAST TECHNIQUE: Contiguous axial images were obtained from the base of the skull through the vertex without intravenous contrast. COMPARISON:  September 28, 2020. FINDINGS: Brain: No evidence of acute infarction, hemorrhage, hydrocephalus, extra-axial collection or mass lesion/mass effect. Areas of suspected infarct seen on previous imaging not well demonstrated on the  current exam. Area of hypodensity in the RIGHT frontal periventricular white matter on image 20 of series 4 appears to have been present on the prior study better and is demonstrated on the previous MRI. Vascular: No hyperdense vessel or unexpected calcification. Skull: Normal. Negative for fracture or focal lesion. Sinuses/Orbits: Visualized paranasal sinuses and orbits are unremarkable. Other: None. IMPRESSION: 1. No acute intracranial abnormality. 2. Subtle changes related to previously described areas of infarct better demonstrated on recent MRI. Electronically Signed   By: Zetta Bills M.D.   On: 12/22/2020 11:25   MR Lumbar Spine W Wo Contrast  Result Date: 12/22/2020 CLINICAL DATA:  Low back pain, possible infection EXAM: MRI LUMBAR  SPINE WITHOUT AND WITH CONTRAST TECHNIQUE: Multiplanar and multiecho pulse sequences of the lumbar spine were obtained without and with intravenous contrast. CONTRAST:  7.7m GADAVIST GADOBUTROL 1 MMOL/ML IV SOLN COMPARISON:  Lumbar MRI 09/30/2020 FINDINGS: Despite efforts by the technologist and patient, motion artifact is present on today's exam and could not be eliminated. This reduces exam sensitivity and specificity. Segmentation: The lowest lumbar type non-rib-bearing vertebra is labeled as L5. Alignment:  Unremarkable Vertebrae: Type 2 degenerative endplate findings at L075-GRMwith endplate irregularities, disc desiccation, and loss of disc height. This is mostly similar to the 09/30/2020 exam. There is also disc desiccation at L4-5 similar to prior. No significant abnormal degree of bony enhancement. Conus medullaris and cauda equina: Conus extends to the L1 level. Conus and cauda equina appear normal. Paraspinal and other soft tissues: Innumerable cysts of varying size and complexity scattered in both kidneys compatible with polycystic kidney disease. The kidneys are indistinct due to motion in today's exam was not optimized to assess the kidneys. Small retroperitoneal lymph nodes are not pathologically enlarged by size criteria. Disc levels: L1-2: Unremarkable. L2-3: Unremarkable. L3-4: Unremarkable. L4-5: Mild bilateral subarticular lateral recess stenosis due to disc bulge, minimally worsened from prior. L5-S1: Mild bilateral subarticular lateral recess stenosis and borderline bilateral foraminal stenosis due to intervertebral spurring, disc bulge, and facet arthropathy, similar to prior. IMPRESSION: 1. No findings of discitis or osteomyelitis. No compelling findings of epidural or psoas abscess. 2. Lumbar spondylosis and degenerative disc disease with mild impingement at L4-5 and L5-S1. Degenerative endplate findings particularly at L5-S1. 3. Autosomal dominant polycystic kidney disease. Electronically  Signed   By: WVan ClinesM.D.   On: 12/22/2020 12:19    Medications: . sodium chloride    . sodium chloride     . (feeding supplement) PROSource Plus  30 mL Oral BID BM  . aspirin  325 mg Oral Daily  . Chlorhexidine Gluconate Cloth  6 each Topical Q0600  . Chlorhexidine Gluconate Cloth  6 each Topical Q0600  . cinacalcet  60 mg Oral Q supper  . docusate sodium  100 mg Oral BID  . feeding supplement (NEPRO CARB STEADY)  237 mL Oral TID BM  . heparin  5,000 Units Subcutaneous Q8H  . methocarbamol  500 mg Oral TID  . multivitamin with minerals  1 tablet Oral Daily  . oxyCODONE  15 mg Oral Q12H  . pantoprazole  40 mg Oral Daily  . pregabalin  75 mg Oral QHS  . senna-docusate  1 tablet Oral BID  . sodium chloride flush  3 mL Intravenous Q12H  . vancomycin variable dose per unstable renal function (pharmacist dosing)   Does not apply See admin instructions    Dialysis Orders: MWF -[facility] 4hrs, BFR350, DFR500, EDW 72kg,2K/2CaProfile 2  Access:LU AVG Heparin5500 unit  qHD Mircera275mg q2wks - last 5/9 Venofer '100mg'$  qHD x5 - 1 completed  Assessment/Plan: 1. AMS- waxing and waning. Likely 2/2 #2. CT head with no acute findings. MRI lumbar spine with no discitis/osteomyelitis.  2. 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  WBC 21.1. tmax 102.5 overnight. Plan for TEE and possibly MRI thoracic spine.  ID following.  3. Back pain - MRI lumbar spine with no discitis/osteomyelitis, +degenerative disc disease at L4-L5, L5-S1. ID plans for MRI T spine. 4. Hypoxia- resolved.  Now on RA.  5. ESRD- On HD MWF. HD today per regular schedule. 6. Hypertension/volume- BP improved, now in goal.  CIF begins trending up restart home meds.Over edw if weights correct. UF has been limited by hypotension, goal 3L today, UF as tolerated.  7. Anemiaof CKD- Hgb 9.7 - ESA recently dosed. Hold IV iron due to  acute infection. 8. Secondary Hyperparathyroidism -Ca in goal. Phos low - d/c binders. Continue sensipar '60mg'$  qd. Not on VDRA. 9. Nutrition- Renal diet w/fluid restriction. Protein supplement.   LJen Mow PA-C [facility] 12/24/2020,10:12 AM  LOS: 2 days

## 2020-12-25 ENCOUNTER — Inpatient Hospital Stay (HOSPITAL_COMMUNITY): Payer: Medicaid Other

## 2020-12-25 DIAGNOSIS — N186 End stage renal disease: Secondary | ICD-10-CM | POA: Diagnosis not present

## 2020-12-25 DIAGNOSIS — B9561 Methicillin susceptible Staphylococcus aureus infection as the cause of diseases classified elsewhere: Secondary | ICD-10-CM | POA: Diagnosis not present

## 2020-12-25 DIAGNOSIS — I639 Cerebral infarction, unspecified: Secondary | ICD-10-CM

## 2020-12-25 DIAGNOSIS — R7881 Bacteremia: Secondary | ICD-10-CM | POA: Diagnosis not present

## 2020-12-25 DIAGNOSIS — I76 Septic arterial embolism: Secondary | ICD-10-CM

## 2020-12-25 DIAGNOSIS — Z952 Presence of prosthetic heart valve: Secondary | ICD-10-CM | POA: Diagnosis not present

## 2020-12-25 DIAGNOSIS — Z89611 Acquired absence of right leg above knee: Secondary | ICD-10-CM | POA: Diagnosis not present

## 2020-12-25 DIAGNOSIS — B182 Chronic viral hepatitis C: Secondary | ICD-10-CM | POA: Diagnosis not present

## 2020-12-25 LAB — COMPREHENSIVE METABOLIC PANEL
ALT: 10 U/L (ref 0–44)
AST: 22 U/L (ref 15–41)
Albumin: 2.2 g/dL — ABNORMAL LOW (ref 3.5–5.0)
Alkaline Phosphatase: 81 U/L (ref 38–126)
Anion gap: 15 (ref 5–15)
BUN: 26 mg/dL — ABNORMAL HIGH (ref 6–20)
CO2: 24 mmol/L (ref 22–32)
Calcium: 8.7 mg/dL — ABNORMAL LOW (ref 8.9–10.3)
Chloride: 98 mmol/L (ref 98–111)
Creatinine, Ser: 4.78 mg/dL — ABNORMAL HIGH (ref 0.61–1.24)
GFR, Estimated: 14 mL/min — ABNORMAL LOW (ref >60)
Glucose, Bld: 90 mg/dL (ref 70–99)
Potassium: 3.3 mmol/L — ABNORMAL LOW (ref 3.5–5.1)
Sodium: 137 mmol/L (ref 135–145)
Total Bilirubin: 1.2 mg/dL (ref 0.3–1.2)
Total Protein: 8.5 g/dL — ABNORMAL HIGH (ref 6.5–8.1)

## 2020-12-25 LAB — CBC WITH DIFFERENTIAL/PLATELET
Abs Immature Granulocytes: 0.39 10*3/uL — ABNORMAL HIGH (ref 0.00–0.07)
Basophils Absolute: 0.1 10*3/uL (ref 0.0–0.1)
Basophils Relative: 0 %
Eosinophils Absolute: 0.1 10*3/uL (ref 0.0–0.5)
Eosinophils Relative: 0 %
HCT: 32.8 % — ABNORMAL LOW (ref 39.0–52.0)
Hemoglobin: 10 g/dL — ABNORMAL LOW (ref 13.0–17.0)
Immature Granulocytes: 1 %
Lymphocytes Relative: 5 %
Lymphs Abs: 1.5 10*3/uL (ref 0.7–4.0)
MCH: 24.6 pg — ABNORMAL LOW (ref 26.0–34.0)
MCHC: 30.5 g/dL (ref 30.0–36.0)
MCV: 80.6 fL (ref 80.0–100.0)
Monocytes Absolute: 3.2 10*3/uL — ABNORMAL HIGH (ref 0.1–1.0)
Monocytes Relative: 11 %
Neutro Abs: 23.7 10*3/uL — ABNORMAL HIGH (ref 1.7–7.7)
Neutrophils Relative %: 83 %
Platelets: 262 10*3/uL (ref 150–400)
RBC: 4.07 MIL/uL — ABNORMAL LOW (ref 4.22–5.81)
RDW: 20.6 % — ABNORMAL HIGH (ref 11.5–15.5)
WBC: 28.9 10*3/uL — ABNORMAL HIGH (ref 4.0–10.5)
nRBC: 0 % (ref 0.0–0.2)

## 2020-12-25 LAB — PHOSPHORUS: Phosphorus: 1.5 mg/dL — ABNORMAL LOW (ref 2.5–4.6)

## 2020-12-25 LAB — MAGNESIUM: Magnesium: 2 mg/dL (ref 1.7–2.4)

## 2020-12-25 LAB — HEPATITIS B SURFACE ANTIBODY, QUANTITATIVE: Hep B S AB Quant (Post): 66.3 m[IU]/mL (ref >9.9)

## 2020-12-25 IMAGING — MR MR THORACIC SPINE W/O CM
4 of 6 series · 18 of 48 positions shown · non-contrast
Comparison: MRI [DATE]

CLINICAL DATA: Back pain.  Leukocytosis.  MRSA bacteremia

EXAM:
MRI THORACIC SPINE WITHOUT CONTRAST
TECHNIQUE: Multiplanar, multisequence MR imaging of the thoracic spine was
performed. No intravenous contrast was administered.

[Series 2: T1 · sagittal · 3.0mm · 0.90mm/px · 3 of 12 slices shown (1 of 2)]
[im 1/12]
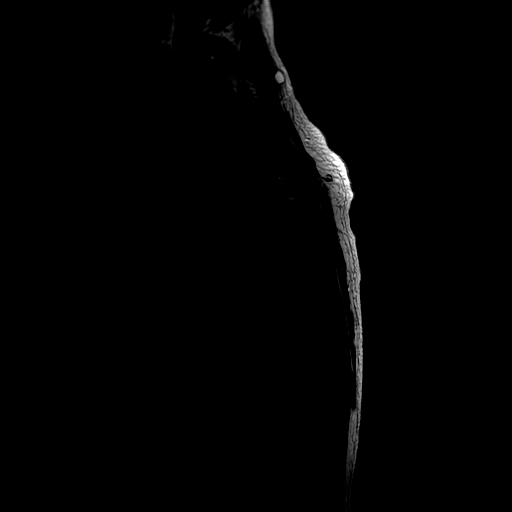
[im 6/12]
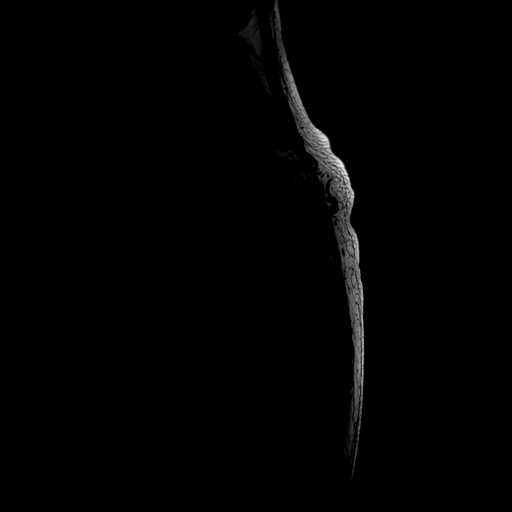
[im 12/12]
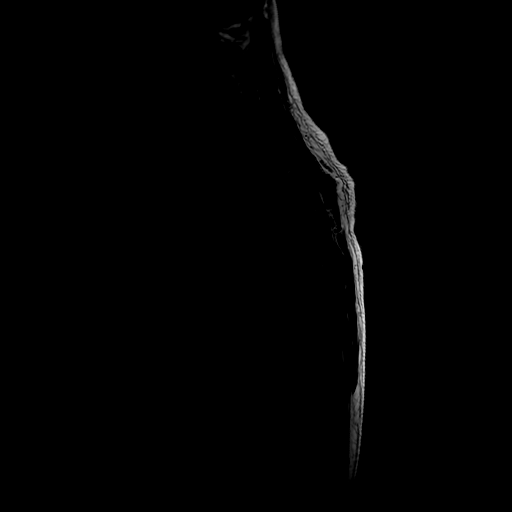

[Series 5: T2 · sagittal · 3.0mm · 0.66mm/px · 6 of 15 slices shown (1 of 2)]
[im 1/15]
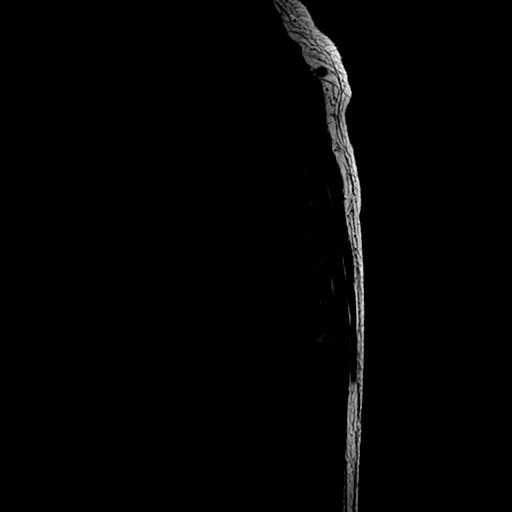
[im 3/15]
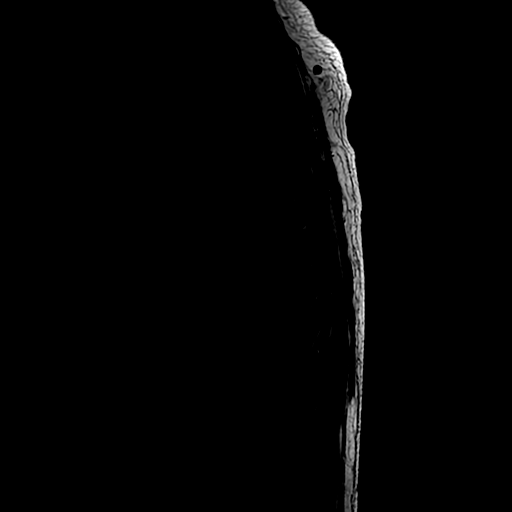
[im 6/15]
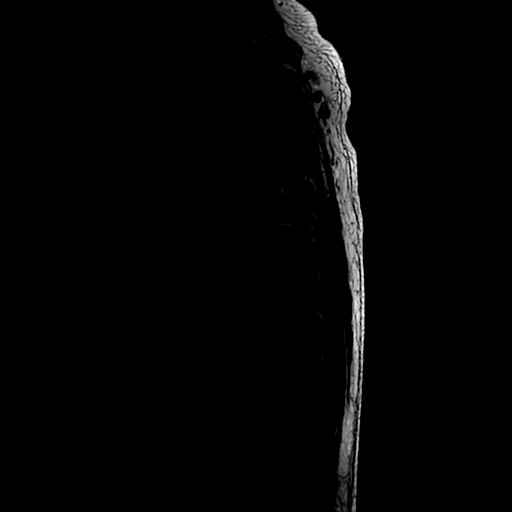
[im 9/15]
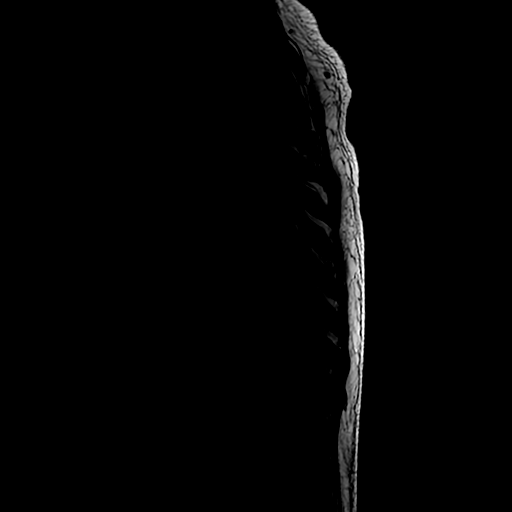
[im 12/15]
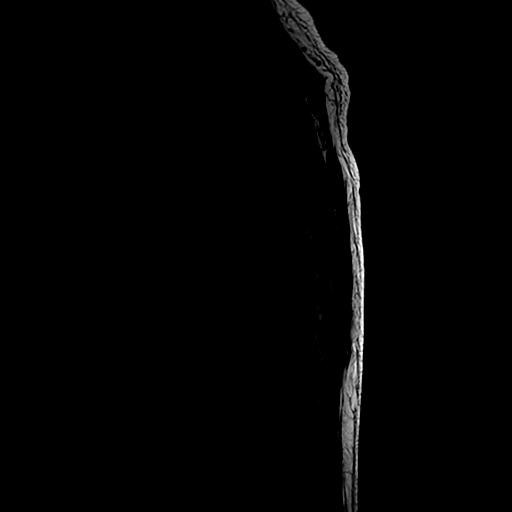
[im 15/15]
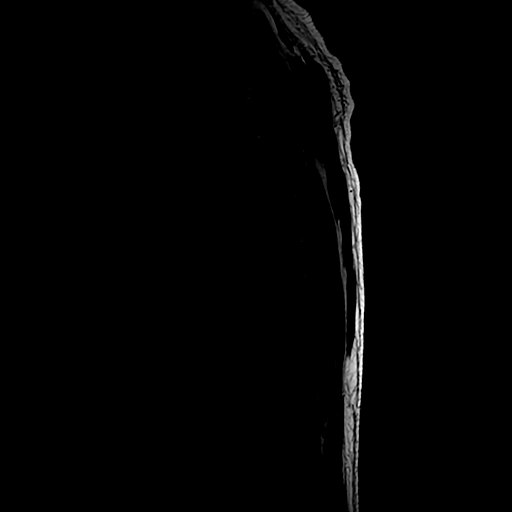

[Series 8: T1 · sagittal · 3.0mm · 0.66mm/px · 3 of 15 slices shown (2 of 2)]
[im 3/15]
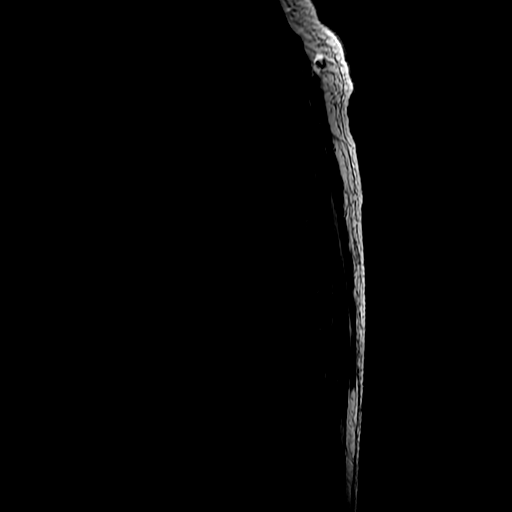
[im 9/15]
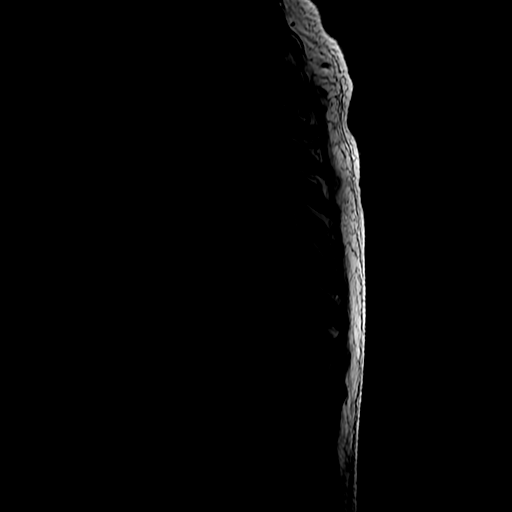
[im 15/15]
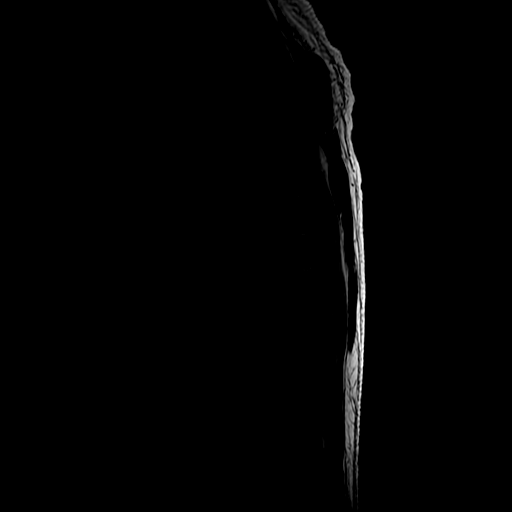

[Series 9: T2 · axial · 4.0mm · 0.39mm/px · z∈[-207,-17]mm · 6 of 35 slices shown (2 of 2)]
[im 1/35]
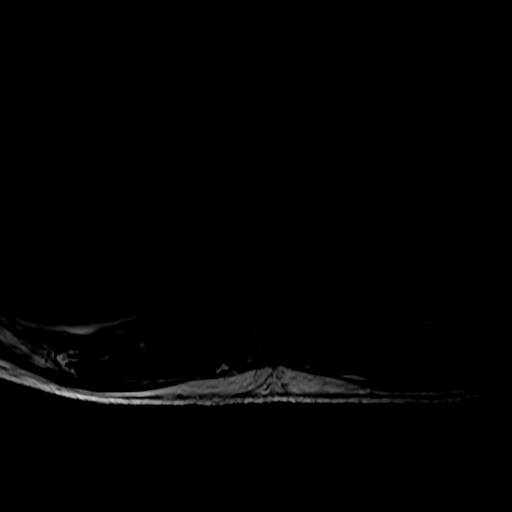
[im 6/35]
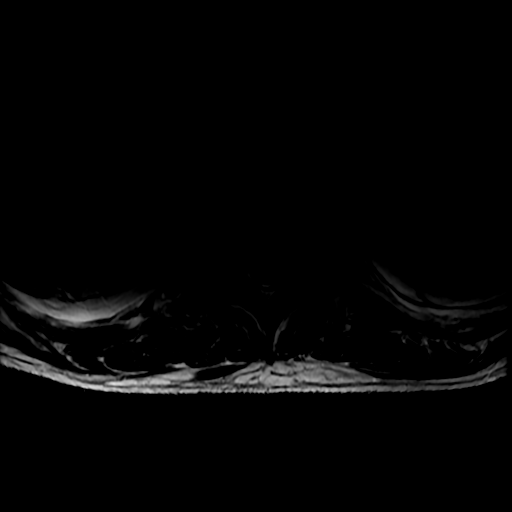
[im 12/35]
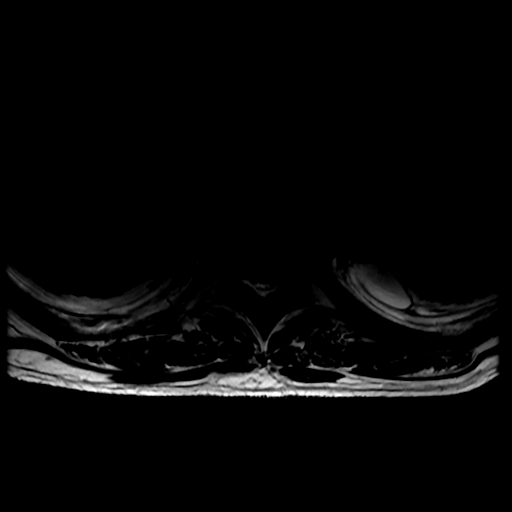
[im 15/35]
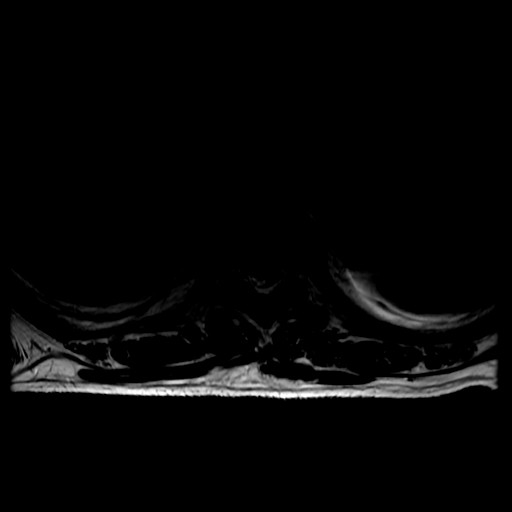
[im 18/35]
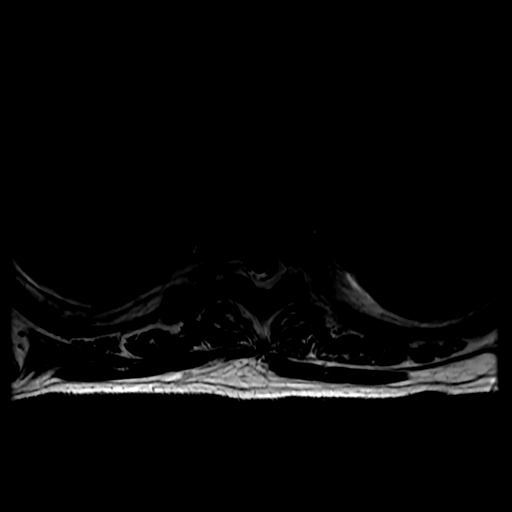
[im 29/35]
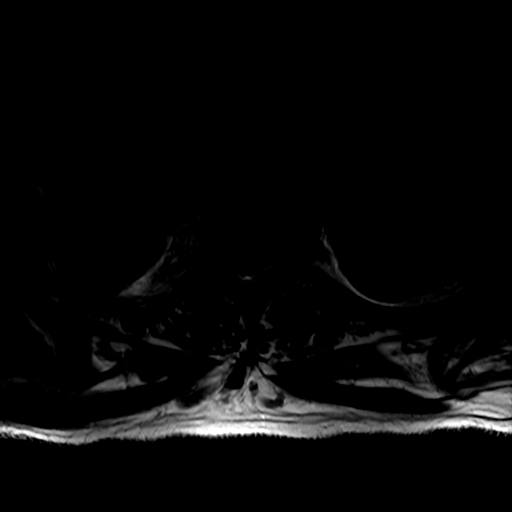

[18 of 48 positions shown; findings below may reference images not displayed]

FINDINGS: Alignment:  Physiologic.

Vertebrae: There is fluid signal present within the T10-11
intervertebral disc space with bone marrow edema at the superior
endplate of T11. More subtle changes along the inferior endplate of
T10. Possible subtle central endplate erosion at T11 (series 6,
image 8). There is mild prevertebral soft tissue edema anterior to
the T10 and T11 vertebral bodies (series 6, images 7-9).

There is a similar degree of endplate marrow edema centered at the
C6-7 intervertebral disc space, likely representing degenerative
discogenic changes.

No acute fracture. No suspicious bone lesion. Diffusely decreased T1
marrow signal throughout the visualized bone marrow, which may be
related to chronic anemia.

Cord: Normal signal and morphology. No evidence of epidural fluid
collection.

Paraspinal and other soft tissues: Small bilateral pleural
effusions. No soft tissue fluid collection or abscess.

Disc levels:

Mild right greater than left foraminal stenosis at the T10-11 level.
No canal stenosis at any level within the thoracic spine.
IMPRESSION: 1. Findings concerning for early discitis-osteomyelitis at the
T10-11 level. Mild prevertebral soft tissue edema at this level. No
evidence of an epidural fluid collection or abscess.
2. Moderate endplate marrow edema centered at the C6-7
intervertebral disc space is similar to the previous MRI, likely
representing degenerative discogenic changes.
3. Small bilateral pleural effusions.

These results will be called to the ordering clinician or
representative by the Radiologist Assistant, and communication
documented in the PACS or [REDACTED].

## 2020-12-25 IMAGING — MR MR HEAD W/O CM
10 of 11 series · 42 of 48 positions shown · non-contrast
Comparison: [DATE]
COMPARISON: [DATE]

Addendum:
CLINICAL DATA: Diplopia

EXAM:
MRI HEAD WITHOUT CONTRAST
TECHNIQUE: Multiplanar, multiecho pulse sequences of the brain and surrounding
structures were obtained without intravenous contrast.

[Series 5: DWI · axial · 3.0mm · 0.88mm/px · z∈[-83,+58]mm · 9 of 96 slices shown (1 of 4)]
[im 1/96]
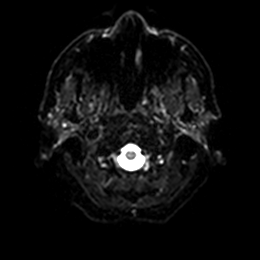
[im 12/96]
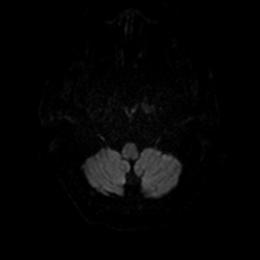
[im 24/96]
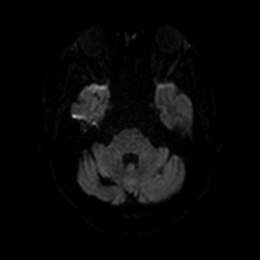
[im 36/96]
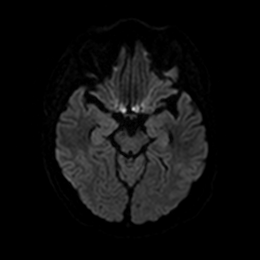
[im 48/96]
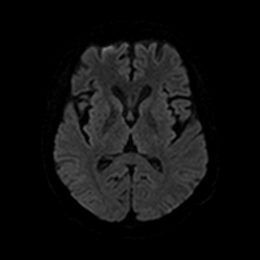
[im 60/96]
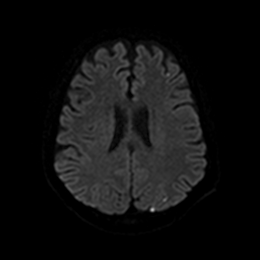
[im 72/96]
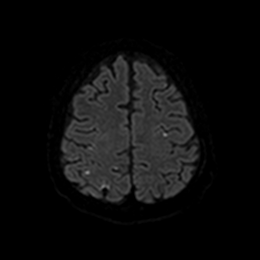
[im 84/96]
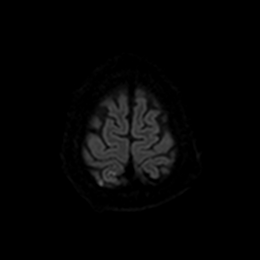
[im 96/96]
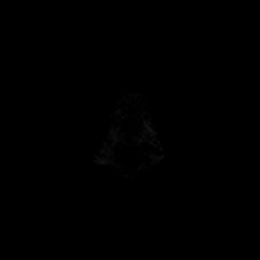

[Series 6: DWI · axial · 3.0mm · 0.88mm/px · z∈[-83,+58]mm · 4 of 47 slices shown (2 of 4)]
[im 1/47]
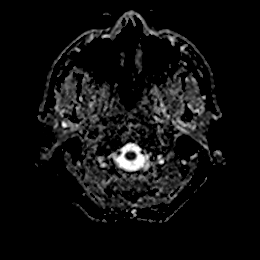
[im 16/47]
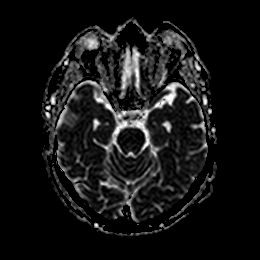
[im 31/47]
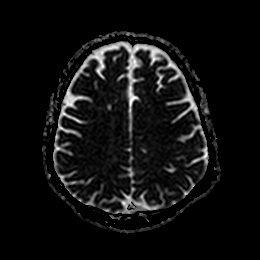
[im 47/47]
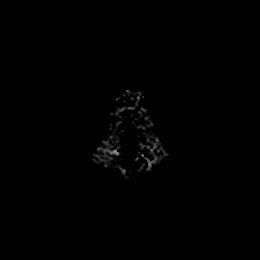

[Series 7: DWI · coronal · 4.0mm · 0.88mm/px · 6 of 66 slices shown (3 of 4)]
[im 1/66]
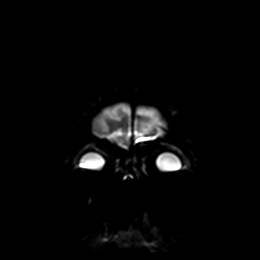
[im 14/66]
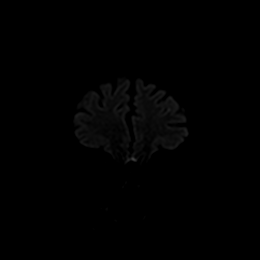
[im 27/66]
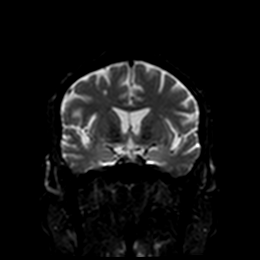
[im 40/66]
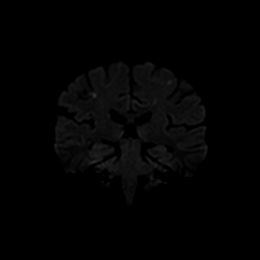
[im 53/66]
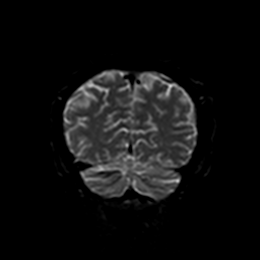
[im 66/66]
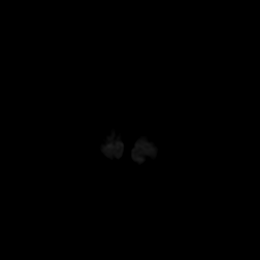

[Series 8: DWI · coronal · 4.0mm · 0.88mm/px · 3 of 33 slices shown (4 of 4)]
[im 1/33]
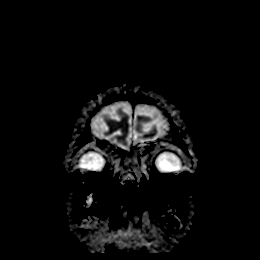
[im 17/33]
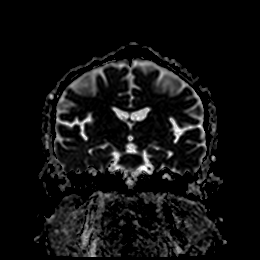
[im 33/33]
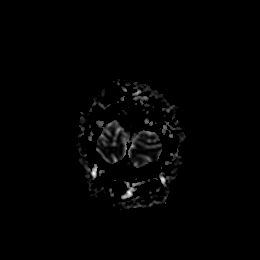

[Series 9: T1 · sagittal · 5.0mm · 0.75mm/px · 2 of 23 slices shown]
[im 1/23]
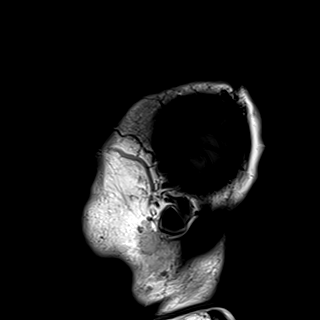
[im 23/23]
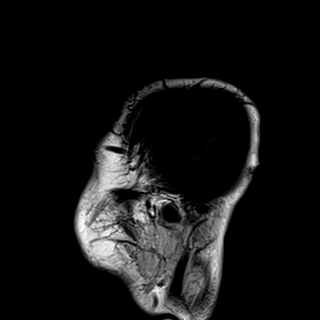

[Series 10: T2 · axial · 5.0mm · 0.72mm/px · z∈[-85,+59]mm · 2 of 25 slices shown (1 of 2)]
[im 1/25]
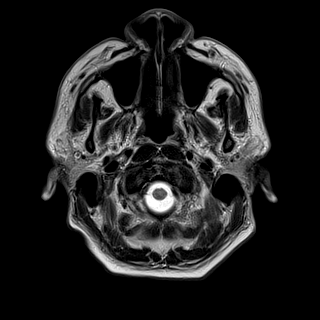
[im 25/25]
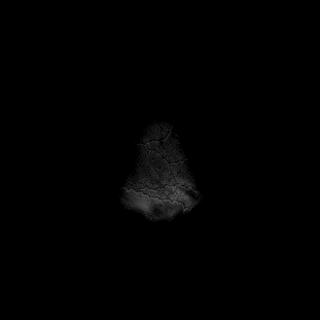

[Series 11: FLAIR · axial · 5.0mm · 0.45mm/px · z∈[-86,+58]mm · 2 of 25 slices shown]
[im 1/25]
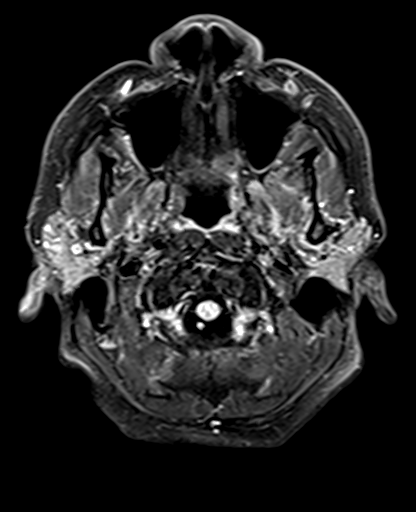
[im 25/25]
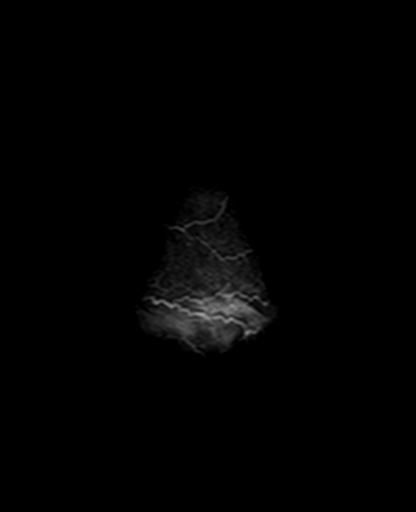

[Series 13: pha_images · axial · 3.0mm · 0.90mm/px · z∈[-103,+74]mm · 5 of 57 slices shown]
[im 1/57]
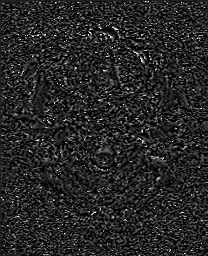
[im 15/57]
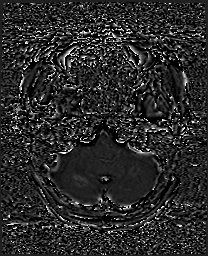
[im 29/57]
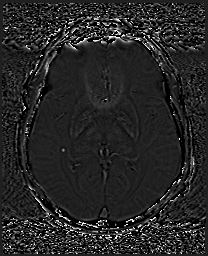
[im 43/57]
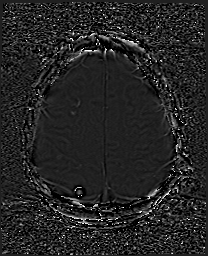
[im 57/57]
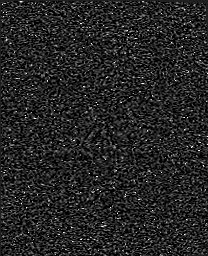

[Series 14: swi_images · axial · 3.0mm · 0.90mm/px · z∈[-103,+74]mm · 6 of 60 slices shown]
[im 1/60]
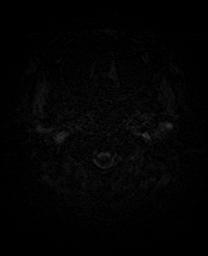
[im 12/60]
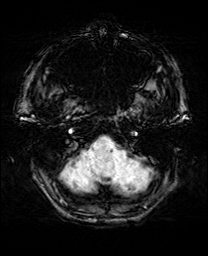
[im 24/60]
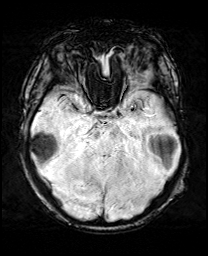
[im 36/60]
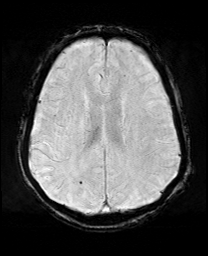
[im 48/60]
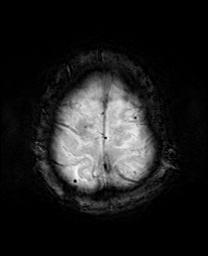
[im 60/60]
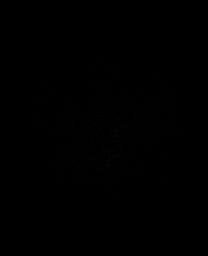

[Series 17: T2 · coronal · 5.0mm · 0.34mm/px · 3 of 29 slices shown (2 of 2)]
[im 1/29]
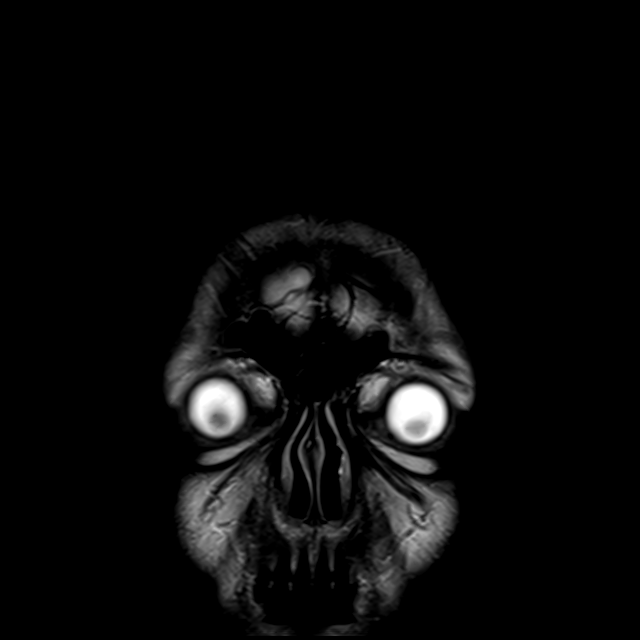
[im 15/29]
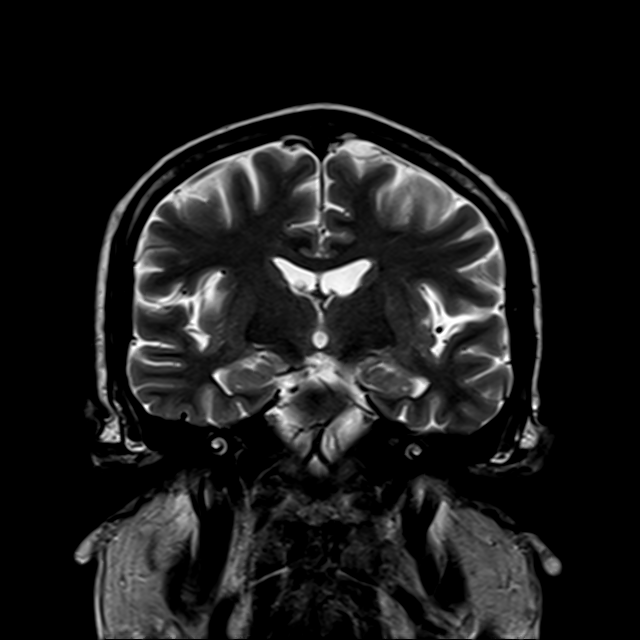
[im 29/29]
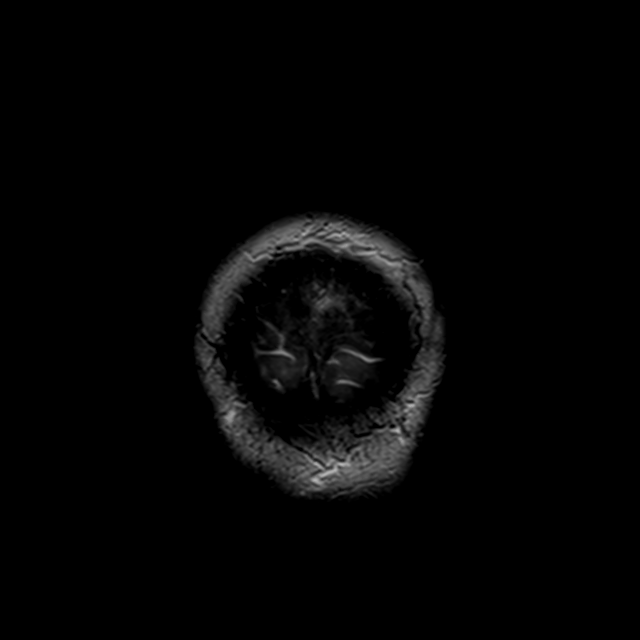

[42 of 48 positions shown; findings below may reference images not displayed]

FINDINGS: Brain: Numerous punctate foci of abnormal diffusion restriction
within both hemispheres in multiple vascular territories. Multiple
scattered microhemorrhages in a nonspecific pattern. There is
multifocal hyperintense T2-weighted signal within the white matter.
Parenchymal volume and CSF spaces are normal. The midline structures
are normal.

Vascular: Major flow voids are preserved.

Skull and upper cervical spine: Normal calvarium and skull base.
Visualized upper cervical spine and soft tissues are normal.

Sinuses/Orbits:No paranasal sinus fluid levels or advanced mucosal
thickening. No mastoid or middle ear effusion. Normal orbits.
IMPRESSION: 1. Numerous punctate foci of abnormal diffusion restriction within
both hemispheres in multiple vascular territories, consistent with
acute infarcts.
2. Multiple scattered microhemorrhages in a nonspecific pattern.

ADDENDUM:
The pattern is most consistent with an embolic process.

*** End of Addendum ***
FINDINGS: Brain: Numerous punctate foci of abnormal diffusion restriction
within both hemispheres in multiple vascular territories. Multiple
scattered microhemorrhages in a nonspecific pattern. There is
multifocal hyperintense T2-weighted signal within the white matter.
Parenchymal volume and CSF spaces are normal. The midline structures
are normal.

Vascular: Major flow voids are preserved.

Skull and upper cervical spine: Normal calvarium and skull base.
Visualized upper cervical spine and soft tissues are normal.

Sinuses/Orbits:No paranasal sinus fluid levels or advanced mucosal
thickening. No mastoid or middle ear effusion. Normal orbits.
IMPRESSION: 1. Numerous punctate foci of abnormal diffusion restriction within
both hemispheres in multiple vascular territories, consistent with
acute infarcts.
2. Multiple scattered microhemorrhages in a nonspecific pattern.

## 2020-12-25 MED ORDER — METOPROLOL TARTRATE 5 MG/5ML IV SOLN
5.0000 mg | Freq: Once | INTRAVENOUS | Status: AC
  Administered 2020-12-25: 5 mg via INTRAVENOUS
  Filled 2020-12-25: qty 5

## 2020-12-25 NOTE — Progress Notes (Signed)
[facility]   Reason for visit: Follow up on bioprosthetic mitral valve endocarditis  Interval History: TEE with positive vegetation on prosthetic mitral valve.   WBC up to 28.9, Tmax 99.8 over last 24 hours.  No acute events.  Asking about discharge plans.  Day 4 total antibiotics Day 4 vancomycin  Day 2 gentamicin  Physical Exam: Constitutional:  Vitals:   12/25/20 0930 12/25/20 1236  BP:  (!) 134/97  Pulse:  (!) 113  Resp:  (!) 25  Temp:  (!) 97.3 F (36.3 C)  SpO2: 98% 95%   patient appears in NAD Respiratory: Normal respiratory effort; CTA B Cardiovascular: RRR GI: soft, nt, nd  Review of Systems: Constitutional: negative for fevers and chills Gastrointestinal: negative for nausea and diarrhea Integument/breast: negative for rash  Lab Results  Component Value Date   WBC 28.9 (H) 12/25/2020   HGB 10.0 (L) 12/25/2020   HCT 32.8 (L) 12/25/2020   MCV 80.6 12/25/2020   PLT 262 12/25/2020    Lab Results  Component Value Date   CREATININE 4.78 (H) 12/25/2020   BUN 26 (H) 12/25/2020   NA 137 12/25/2020   K 3.3 (L) 12/25/2020   CL 98 12/25/2020   CO2 24 12/25/2020    Lab Results  Component Value Date   ALT 10 12/25/2020   AST 22 12/25/2020   ALKPHOS 81 12/25/2020     Microbiology: Recent Results (from the past 240 hour(s))  Resp Panel by RT-PCR (Flu A&B, Covid) Nasopharyngeal Swab     Status: None   Collection Time: 12/22/20  7:14 AM   Specimen: Nasopharyngeal Swab; Nasopharyngeal(NP) swabs in vial transport medium  Result Value Ref Range Status   SARS Coronavirus 2 by RT PCR NEGATIVE NEGATIVE Final    Comment: (NOTE) SARS-CoV-2 target nucleic acids are NOT DETECTED.  The SARS-CoV-2 RNA is generally detectable in upper respiratory specimens during the acute phase of infection. The lowest concentration of SARS-CoV-2 viral copies this assay can detect is 138 copies/mL. A negative result does not preclude  SARS-Cov-2 infection and should not be used as the sole basis for treatment or other patient management decisions. A negative result may occur with  improper specimen collection/handling, submission of specimen other than nasopharyngeal swab, presence of viral mutation(s) within the areas targeted by this assay, and inadequate number of viral copies(<138 copies/mL). A negative result must be combined with clinical observations, patient history, and epidemiological information. The expected result is Negative.  Fact Sheet for Patients:  EntrepreneurPulse.com.au  Fact Sheet for Healthcare Providers:  IncredibleEmployment.be  This test is no t yet approved or cleared by the [place] FDA and  has been authorized for detection and/or diagnosis of SARS-CoV-2 by FDA under an Emergency Use Authorization (EUA). This EUA will remain  in effect (meaning this test can be used) for the duration of the COVID-19 declaration under Section 564(b)(1) of the Act, 21 U.S.C.section 360bbb-3(b)(1), unless the authorization is terminated  or revoked sooner.       Influenza A by PCR NEGATIVE NEGATIVE Final   Influenza B by PCR NEGATIVE NEGATIVE Final    Comment: (NOTE) The Xpert Xpress SARS-CoV-2/FLU/RSV plus assay is intended as an aid in the diagnosis of influenza from Nasopharyngeal swab specimens and should not be used as a sole basis for treatment. Nasal washings and aspirates are unacceptable for Xpert Xpress SARS-CoV-2/FLU/RSV testing.  Fact Sheet for Patients: EntrepreneurPulse.com.au  Fact Sheet for Healthcare Providers: IncredibleEmployment.be  This test is  not yet approved or cleared by the [place] and has been authorized for detection and/or diagnosis of SARS-CoV-2 by FDA under an Emergency Use Authorization (EUA). This EUA will remain in effect (meaning this test can be used) for the duration of  the COVID-19 declaration under Section 564(b)(1) of the Act, 21 U.S.C. section 360bbb-3(b)(1), unless the authorization is terminated or revoked.  Performed at [facility], [place] [address] [place], [place] [address]   Blood Culture (routine x 2)     Status: Abnormal   Collection Time: 12/22/20  7:14 AM   Specimen: BLOOD  Result Value Ref Range Status   Specimen Description BLOOD SITE NOT SPECIFIED  Final   Special Requests   Final    BOTTLES DRAWN AEROBIC AND ANAEROBIC Blood Culture adequate volume   Culture  Setup Time   Final    GRAM POSITIVE COCCI IN CLUSTERS IN BOTH AEROBIC AND ANAEROBIC BOTTLES CRITICAL RESULT CALLED TO, READ BACK BY AND VERIFIED WITHDion Body [facility] 2028 12/22/20 A BROWNING Performed at [facility], [place] [address], [place],  [address]    Culture METHICILLIN RESISTANT STAPHYLOCOCCUS AUREUS (A)  Final   Report Status 12/24/2020 FINAL  Final   Organism ID, Bacteria METHICILLIN RESISTANT STAPHYLOCOCCUS AUREUS  Final      Susceptibility   Methicillin resistant staphylococcus aureus - MIC*    CIPROFLOXACIN 4 RESISTANT Resistant     ERYTHROMYCIN >=8 RESISTANT Resistant     GENTAMICIN <=0.5 SENSITIVE Sensitive     OXACILLIN >=4 RESISTANT Resistant     TETRACYCLINE <=1 SENSITIVE Sensitive     VANCOMYCIN 1 SENSITIVE Sensitive     TRIMETH/SULFA <=10 SENSITIVE Sensitive     CLINDAMYCIN <=0.25 SENSITIVE Sensitive     RIFAMPIN <=0.5 SENSITIVE Sensitive     Inducible Clindamycin NEGATIVE Sensitive     * METHICILLIN RESISTANT STAPHYLOCOCCUS AUREUS  Blood Culture ID Panel (Reflexed)     Status: Abnormal   Collection Time: 12/22/20  7:14 AM  Result Value Ref Range Status   Enterococcus faecalis NOT DETECTED NOT DETECTED Final   Enterococcus Faecium NOT DETECTED NOT DETECTED Final   Listeria monocytogenes NOT DETECTED NOT DETECTED Final   Staphylococcus species DETECTED (A) NOT DETECTED Final    Comment: CRITICAL RESULT CALLED TO, READ BACK BY AND  VERIFIED WITH: J MILLEN PHARMD 2028 12/22/20 A BROWNING    Staphylococcus aureus (BCID) DETECTED (A) NOT DETECTED Final    Comment: Methicillin (oxacillin)-resistant Staphylococcus aureus (MRSA). MRSA is predictably resistant to beta-lactam antibiotics (except ceftaroline). Preferred therapy is vancomycin unless clinically contraindicated. Patient requires contact precautions if  hospitalized. CRITICAL RESULT CALLED TO, READ BACK BY AND VERIFIED WITH: Dion Body PHARMD 2028 12/22/20 A BROWNING    Staphylococcus epidermidis NOT DETECTED NOT DETECTED Final   Staphylococcus lugdunensis NOT DETECTED NOT DETECTED Final   Streptococcus species NOT DETECTED NOT DETECTED Final   Streptococcus agalactiae NOT DETECTED NOT DETECTED Final   Streptococcus pneumoniae NOT DETECTED NOT DETECTED Final   Streptococcus pyogenes NOT DETECTED NOT DETECTED Final   A.calcoaceticus-baumannii NOT DETECTED NOT DETECTED Final   Bacteroides fragilis NOT DETECTED NOT DETECTED Final   Enterobacterales NOT DETECTED NOT DETECTED Final   Enterobacter cloacae complex NOT DETECTED NOT DETECTED Final   Escherichia coli NOT DETECTED NOT DETECTED Final   Klebsiella aerogenes NOT DETECTED NOT DETECTED Final   Klebsiella oxytoca NOT DETECTED NOT DETECTED Final   Klebsiella pneumoniae NOT DETECTED NOT DETECTED Final   Proteus species NOT DETECTED  NOT DETECTED Final   Salmonella species NOT DETECTED NOT DETECTED Final   Serratia marcescens NOT DETECTED NOT DETECTED Final   Haemophilus influenzae NOT DETECTED NOT DETECTED Final   Neisseria meningitidis NOT DETECTED NOT DETECTED Final   Pseudomonas aeruginosa NOT DETECTED NOT DETECTED Final   Stenotrophomonas maltophilia NOT DETECTED NOT DETECTED Final   Candida albicans NOT DETECTED NOT DETECTED Final   Candida auris NOT DETECTED NOT DETECTED Final   Candida glabrata NOT DETECTED NOT DETECTED Final   Candida krusei NOT DETECTED NOT DETECTED Final   Candida parapsilosis NOT  DETECTED NOT DETECTED Final   Candida tropicalis NOT DETECTED NOT DETECTED Final   Cryptococcus neoformans/gattii NOT DETECTED NOT DETECTED Final   Meth resistant mecA/C and MREJ DETECTED (A) NOT DETECTED Final    Comment: CRITICAL RESULT CALLED TO, READ BACK BY AND VERIFIED WITHDion Body [facility] 2028 12/22/20 A BROWNING Performed at [facility], [address] [place], [place] [address]   Blood Culture (routine x 2)     Status: Abnormal   Collection Time: 12/22/20  7:19 AM   Specimen: BLOOD  Result Value Ref Range Status   Specimen Description BLOOD SITE NOT SPECIFIED  Final   Special Requests   Final    BOTTLES DRAWN AEROBIC AND ANAEROBIC Blood Culture results may not be optimal due to an excessive volume of blood received in culture bottles   Culture  Setup Time   Final    GRAM POSITIVE COCCI IN CLUSTERS IN BOTH AEROBIC AND ANAEROBIC BOTTLES IDENTIFICATION TO FOLLOW CRITICAL RESULT CALLED TO, READ BACK BY AND VERIFIED WITH: J MILLEN PHARMD 2028 12/22/20 A BROWNING    Culture (A)  Final    STAPHYLOCOCCUS AUREUS SUSCEPTIBILITIES PERFORMED ON PREVIOUS CULTURE WITHIN THE LAST 5 DAYS. Performed at [facility], [place] [address], [place], [place] [address]    Report Status 12/24/2020 FINAL  Final    Impression/Plan:  1. 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   He will need evaluation by [facility]CTS, if not already aware.   Have added gentamicin Will add rifampin later, once blood cultures remain negative.   2.  Fever - trending down with treatment and will continue to monitor  3.  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  On appropriate treatment as above.    We will follow up again on Monday

## 2020-12-25 NOTE — Progress Notes (Signed)
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 Discussed with Neurology who will see pt. Dr. Curly [place] recommends stopping the aspirin at this time

## 2020-12-25 NOTE — Progress Notes (Signed)
PROGRESS NOTE    Patrick Anthony  [id number] DOB: April 13, 1971 DOA: 12/22/2020 PCP: [name], MD   Brief Narrative:  The patient is a 50 atrial African-American male with a past medical history significant for but not limited to end-stage renal disease on hemodialysis on Monday Wednesday Friday, chronic diastolic CHF, hyperlipidemia, history of liver cirrhosis secondary to hepatitis C, history of alcohol abuse, OSA as well as recent AKA done by Dr. Sharol Given on 10/29/2020 and other comorbidities who presented with a chief complaint of spiking fevers for last few days and then altered mental status.  Reportedly patient was at his facility when they were noticing that he was altered and history is difficult to be obtained from the patient himself.  Of note he has had a prolonged hospitalization from 09/27/20 until April 6, 50 he was found to have MRSA bacteremia as well as mitral valve endocarditis with PFO and septic emboli to the brain along with that time.  He underwent a bioprosthetic mitral valve replacement along with closure of the PFO on 10/14/2020.  Also during that hospitalization he was treated for osteomyelitis and abscess of the right tibia status post AKA on 10/29/2020 by Dr. Sharol Given.  He was discharged to [facility] for continuation of vancomycin at the nursing facility until 11/25/2020.  Reportedly he is usually alert and oriented x4.  Yesterday he is only alert and oriented to person and place but not the situation.  He complained about severe pain in his back has been present for some time and he describes shooting pains in the right leg but was amputated.  He denies any shortness breath or cough.  In the ED he presented with a fever of 103 and rales were reported in the lower lung field on the right lung.  Upon arrival to the ED he was noted to be septic given that he had an elevated temperature, respirations as well as being tachycardia and had a new O2 requirement as well as being altered.   CT scan of the brain showed no acute abnormalities but did show several changes that were chronic injury to the areas of infarct noted on the previous MRI.  His WBC was elevated 20,200.  Chest x-ray showed scarring in the right upper lobe, middle lobe atelectasis and a small left-sided pleural effusion stable cardiomegaly.  Initial sepsis protocol was initiated and he was given IV vancomycin and cefepime.  Further work-up reveals that he has another MRSA bacteremia and infectious diseases is involved again and nephrology is involved as well.  Patient is to get a repeat TEE and this was done today 12/24/20 to evaluate his mitral valve. Still pending to get a MRI the T-spine with and without contrast with hemodialysis after.  UnFortunately a TEE shows likely bioprosthetic valve infection so infectious disease is now going to add gentamicin and rifampin once his cultures are cleared.  We will still pursue his thoracic spine MRI which is still pending to be done.  Overnight the patient developed blurred vision and he underwent a head MRI which showed recurrent cardioembolic strokes in the setting of septic emboli from infective endocarditis.  Neurology was consulted and they did not recommend further work-up given that he recently just had an MRI of the brain with and without contrast on 09/28/2020 as well as an echo and carotid Dopplers.  Neurology recommending holding antiplatelets given the risk of hemorrhagic conversion of recurrent infective endocarditis related strokes and resume in 1 week if patient remains stable.  Patient's aspirin was then discontinued  Assessment & Plan:   Principal Problem:   Bacteremia due to Staphylococcus aureus Active Problems:   Anemia in chronic kidney disease   Hepatitis C   ESRD on dialysis (HCC)   S/P mitral valve replacement   S/P AKA (above knee amputation), right (HCC)   Personal history of Methicillin resistant Staphylococcus aureus infection   SIRS (systemic  inflammatory response syndrome) (HCC)  Severe sepsis secondary to an MRSA bacteremia and Prostetic Valve Endocarditis present on admission -Presented the fever up to 103 F, tachycardia, tachypnea, a new oxygen requirement, altered mental status as well as a leukocytosis of 20,200 and also with hypotension -Lactic acid level was reassuring at 1.7 on admission -Chest x-ray on admission only noted low lung volumes and possible signs of atelectasis with no clear source of infection appreciated at that time -Blood cultures now revealing an MRSA bacteremia; Repeat Blood Cx pending  -He has been admitted progressive bed -He was initiated on IV antibiotics with vancomycin and cefepime but cefepime has now been discontinued -Infectious diseases has been consulted and plan further work-up for the patient and have ordered a repeat TEE and discussed with cardiology given his prostatic mitral valve and also will be getting a T-spine MRI with and without contrast with dialysis to be done afterwards; Unfortunately did not get MRI this AM -Patient did get TEE which showed "Possible small vegetations associated with the bioprosthetic mitral valve and native aortic valve. No LAA thrombus. Negative for PFO. Moderate LVH Severe RA/RV dilitation with mild TR LVEF 60-65%." -We will be continuing vancomycin but because of Concern for possible Valvular Vegatations including a small, moblile Filamentous Structure on the posterior bioprosthetic vavlve leaflet was concerning for vegetation, ID now recommending PVE Coverage and will start Gentamicin and Add Rifampin once CX's Clear -Repeat blood cultures have been planned for 12/24/2020 -Appreciate further ID evaluation recommendations; sepsis physiology was improving but patient continues to still spike Temperatures and becomes intermittently tachycardic and tachypenic  -Patient's WBC minimally improved and went from 20.2 -> 19.7 -> 21.1 -> 28.9 -Now infectious diseases  recommending referral to cardiothoracic surgery and continuing gentamicin and vancomycin and adding rifampin later once blood cultures remain negative -Repeat Blood Cx's are pending   Acute CVAs -Patient has recurrent cardioembolic strokes in the setting of septic emboli from infective endocarditis -MRI of the brain overnight done and showed "Numerous punctate foci of abnormal diffusion restriction within both hemispheres in multiple vascular territories, consistent with acute infarcts.  Multiple scattered microhemorrhages in a nonspecific pattern." -Neurology was consulted and patient already had most of the stroke work-up done in the last few admissions and they did not recommend repeating echocardiogram as he had a TEE 520 confirming infective endocarditis -Neurology recommended holding antiplatelets at the time given risk of hemorrhagic conversion -DVT prophylaxis changed from heparin to SCDs and recommending resuming in 1 week -Neurology recommended continue telemetry monitoring and a 30-day event monitor on discharge if no arrhythmias were captured -They recommended blood pressure goal is normotensive and recommending and PT OT and SLP evaluation -We will need to continue monitor patient carefully  Hypotension -In the setting of MRSA bacteremia his admission blood pressures were as low as 84/51 -Improved with IV fluid resuscitation -Continue to monitor blood pressures per protocol; blood pressures are still on the softer side but last blood pressure reading was 141/99 -His home blood pressure medications include clonidine 0.2 mg p.o. twice daily as well as metoprolol tartrate 50 mg  p.o. twice daily which have both been held -Goal MAP bolus of at least 65 -Continue with IV fluid hydration as needed but will need to be judicious given that he is a dialysis patient  Acute metabolic encephalopathy in the setting of infection, improved  -He is noted to be alert and oriented x2 but was  previously alert and oriented x4 -CT scan did not show any acute abnormalities; he does have a prior history of mitral valve endocarditis with septic emboli to the brain and to the lung -He appears more appropriate today and appears closer to his baseline so we will hold off further investigation -Continue with neurochecks per protocol -See Above   ESRD on Hemodialysis Monday Wednesday  Secondary hyperparathyroidism -Patient has a history of poorly cystic kidney disease Normally dialyzes Monday Wednesday Friday -He was dialyzed following contrast yesterday and will be dialyzed again tomorrow after the MRI is done -Nephrology been consulted and appreciate further evaluation recommendations and they are planning for dialysis tomorrow first shift and he went early this AM -Patient's BUNs/creatinine went from 56/8.20 -> 29/4.91 -> 43/6.91 -> 26/4.78 -Given that patient's calcium is at goal and his phosphorus is low they will discontinue binders but they recommended continue Cinacalcet 60 mg p.o. 4 times daily; will discontinue sevelamer carbonate 600 g p.o. 3 times daily -Phos level is very low at 1.5 and will defer to Nephrology to replete  -Nephrology to dialyze the patient next on 12/27/2020 as patient tolerated 3 L of ultrafiltration on Friday  Hyponatremia -Mild. Na+ Went from 135 -> 133 -> 137 -Expect to be corrected in Dialysis -Continue to Monitor and Trend -Repeat CMP in the AM   Acute Respiratory Failure with Hypoxia, improved  -Was placed on 2 L supplemental oxygen via nasal cannula and does not wear supplemental oxygen at home -Chest x-ray yesterday showed "Lower lung volumes with atelectasis. Chronic cardiomegaly and right lung  Scarring." -Patient does have a history of MRSA bacteremia with septic emboli to the lungs -SpO2: 92 % O2 Flow Rate (L/min): 2 L/min -Continuous pulse oximetry maintain O2 saturations greater than 90% Continue supplemental oxygen via nasal cannula and  wean O2 as tolerated -We will need a repeat chest x-ray and an ambulatory home O2 screen prior to discharge  History of MRSA bacteremia with mitral valve endocarditis and PFO and septic emboli to the brain and left lung -He previously completed antibiotics on 11/25/2020 but now is back with another bacteremia as above so is now going to be on IV Vancomycin, IV Gentamicin, and IV Rifampin once Cx's clear   Anemia of chronic kidney disease -Patient presented with a hemoglobin 9.4 g/dL which appears near his baseline; his hemoglobin/hematocrit today is 10.0/32.8 -check anemia panel in the a.m. -Continue to monitor for signs and symptoms of bleeding; no overt bleeding noted -Repeat CBC in the a.m.  Hypokalemia -Mild -The patient is potassium went from 4.4 and is now 3.3 -Defer to nephrology to replete  -Continue monitor and replete as necessary and check magnesium level in the a.m. -Repeat CMP in the a.m.  Chronic Pain Syndrome with Chronic Opiate Dependence -He reports chronic back pain as well as right lower extremity that was recently amputated -Resuming home regimen of OxyContin and hydromorphone as needed however given his back pain will need to rule out infection etiology and he will undergo an MRI with and without contrast in the morning  Status post right AKA -Recently had an AKA done in the last 2 months -  Continue with Pregabalin 75 mg p.o. nightly  Liver cirrhosis secondary to chronic hepatitis C -His HCC quantitative was 1,750,000 on 09/29/2020 -Infectious diseases been consulted given his MRSA bacteremia again and appreciate further evaluation recommendations.  GERD -Continue with PPI with pantoprazole 40 mg p.o. daily  DVT prophylaxis: Heparin 5000 units subcu every 8 Code Status: FULL CODE  Family Communication: No family present at bedside but spoke to Daughter [place] via telephone Disposition Plan: Pending further clinical work-up and evaluation by infectious diseases  as well as nephrology, Neurology, and TCTS  Status is: Inpatient  Remains inpatient appropriate because:Unsafe d/c plan, IV treatments appropriate due to intensity of illness or inability to take PO and Inpatient level of care appropriate due to severity of illness   Dispo: The patient is from: Home              Anticipated d/c is to: Home              Patient currently is not medically stable to d/c.   Difficult to place patient No  Consultants:   Infectious Diseases  Nephrology  Cardiology for TEE  Neurology  Have paged Cardiothoracic Surgery for consultation.    Procedures:  TEE done by Dr. Debara Pickett  Findings:  1. LEFT VENTRICLE: The left ventricular wall thickness is moderately increased.  The left ventricular cavity is normal in size. Wall motion is normal.  LVEF is 60-65%.  2. RIGHT VENTRICLE:  The right ventricle is severely dilated without any thrombus or masses.    3. LEFT ATRIUM:  The left atrium is normal in size without any thrombus or masses.  There is not spontaneous echo contrast ("smoke") in the left atrium consistent with a low flow state.  4. LEFT ATRIAL APPENDAGE:  The large left atrial appendage is free of any thrombus or masses. The appendage has single lobes. Pulse doppler indicates high flow in the appendage.  5. ATRIAL SEPTUM:  The atrial septum appears intact and is free of thrombus and/or masses.  There is no evidence for interatrial shunting by color doppler and saline microbubble.  6. RIGHT ATRIUM:  The right atrium is severely dilated in size and function without any thrombus or masses.  7. 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 There is  trivial regurgitation.  There is a possible small mobile vegetation adherent to the posterior leaflet which appears filamentous, this could also be residual valve tissue or suture.   8. AORTIC VALVE:  The aortic valve is trileaflet, normal in structure and function  with no regurgitation.  The valve was visualized with 2D/3D modes - there appears to be a small mobile density at the base of the left coronary cusp seen in the long-axis view on the outflow side of the leaflet, possibly consistent with vegetation.  9. TRICUSPID VALVE:  The tricuspid valve is normal in structure and function with Mild regurgitation.  There were no vegetations or stenosis  10.  PULMONIC VALVE:  The pulmonic valve is normal in structure and function with trivial regurgitation.  There were no vegetations or stenosis.   11. AORTIC ARCH, ASCENDING AND DESCENDING AORTA:  There was grade 2 Ron Parker et. Al, 1992) atherosclerosis of the ascending aorta, aortic arch, or proximal descending aorta.  12. PULMONARY VEINS: Anomalous pulmonary venous return was not noted.  13. PERICARDIUM: The pericardium appeared normal and non-thickened.  There is no pericardial effusion.  IMPRESSION:   1. Possible small vegetations associated with  the bioprosthetic mitral valve and native aortic valve.  2. No LAA thrombus 3. Negative for PFO 4. Moderate LVH 5. Severe RA/RV dilitation with mild TR 6. LVEF 60-65%  RECOMMENDATIONS:    1. Consider longer course of antibiotics for possible valvular vegetations, including a small, mobile filamentous structure on the posterior bioprosthetic valve leaflet which is concerning for vegetation.  Antimicrobials:  Anti-infectives (From admission, onward)   Start     Dose/Rate Route Frequency Ordered Stop   12/27/20 1800  gentamicin (GARAMYCIN) IVPB 80 mg        80 mg 100 mL/hr over 30 Minutes Intravenous Every M-W-F (1800) 12/24/20 1612     12/24/20 1700  gentamicin (GARAMYCIN) IVPB 120 mg        120 mg 200 mL/hr over 30 Minutes Intravenous  Once 12/24/20 1612 12/24/20 1910   12/24/20 1430  vancomycin (VANCOCIN) IVPB 750 mg/150 ml premix        750 mg 150 mL/hr over 60 Minutes Intravenous Every M-W-F (Hemodialysis) 12/24/20 1324     12/22/20 1201   vancomycin variable dose per unstable renal function (pharmacist dosing)  Status:  Discontinued         Does not apply See admin instructions 12/22/20 1202 12/24/20 1324   12/22/20 1200  vancomycin (VANCOREADY) IVPB 750 mg/150 mL  Status:  Discontinued        750 mg 150 mL/hr over 60 Minutes Intravenous Every M-W-F (Hemodialysis) 12/22/20 0845 12/22/20 1202   12/22/20 1200  ceFEPIme (MAXIPIME) 2 g in sodium chloride 0.9 % 100 mL IVPB  Status:  Discontinued        2 g 200 mL/hr over 30 Minutes Intravenous Every M-W-F (Hemodialysis) 12/22/20 0845 12/23/20 1450   12/22/20 0715  vancomycin (VANCOREADY) IVPB 1500 mg/300 mL        1,500 mg 150 mL/hr over 120 Minutes Intravenous  Once 12/22/20 0714 12/22/20 1034   12/22/20 0715  ceFEPIme (MAXIPIME) 2 g in sodium chloride 0.9 % 100 mL IVPB        2 g 200 mL/hr over 30 Minutes Intravenous  Once 12/22/20 0714 12/22/20 0806       Subjective: Seen and examined at bedside and overnight he had a period of blurred vision for about 20 minutes and was surprised to hear that he had multiple CVAs in the setting of his endocarditis.  He denies any nausea or vomiting.  Fevers are improving.  States his appetite is okay.  No lightheadedness or dizziness.  Concerned about his infection.  No other concerns or plans at this time.  Objective: Vitals:   12/25/20 0741 12/25/20 0930 12/25/20 1236 12/25/20 1552  BP: (!) 146/103  (!) 134/97 (!) 141/99  Pulse: 98  (!) 113 (!) 112  Resp: 18  (!) 25 19  Temp: 98.1 F (36.7 C)  (!) 97.3 F (36.3 C) 98.1 F (36.7 C)  TempSrc: Oral  Oral Oral  SpO2: 98% 98% 95% 92%  Weight:      Height:        Intake/Output Summary (Last 24 hours) at 12/25/2020 1636 Last data filed at 12/25/2020 0930 Gross per 24 hour  Intake 966.32 ml  Output --  Net 966.32 ml   Filed Weights   12/24/20 0755 12/24/20 1144 12/25/20 0319  Weight: 76.4 kg 72.5 kg 69.9 kg   Examination: Physical Exam:  Constitutional: WN/WD AAM in NAD and  appears a little anxious Eyes: Lids and conjunctivae normal, sclerae anicteric  ENMT: External Ears, Nose  appear normal. Grossly normal hearing.  Neck: Appears normal, supple, no cervical masses, normal ROM, no appreciable thyromegaly; no JVD Respiratory: Diminished to auscultation bilaterally, no wheezing, rales, rhonchi or crackles. Normal respiratory effort and patient is not tachypenic. No accessory muscle use. Not wearing supplemental O2 via [place] Cardiovascular: RRR, no murmurs / rubs / gallops. S1 and S2 auscultated. Trace extremity edema on the Left Abdomen: Soft, non-tender, Distended 2/2 body habitus. No masses palpated. No appreciable hepatosplenomegaly. Bowel sounds positive.  GU: Deferred. Musculoskeletal: No clubbing / cyanosis of digits/nails. Has a Right AKA Skin: No rashes, lesions, ulcers on a limited skin evaluation. No induration; Warm and dry.  Neurologic: CN 2-12 grossly intact with no focal deficits. Romberg sign and cerebellar reflexes not assessed.  Psychiatric: Normal judgment and insight. Alert and oriented x 3. Anxious mood and appropriate affect.   Data Reviewed: I have personally reviewed following labs and imaging studies  CBC: Recent Labs  Lab 12/22/20 0714 12/23/20 0119 12/24/20 0347 12/25/20 0056  WBC 20.2* 19.7* 21.1* 28.9*  NEUTROABS 18.0*  --  16.9* 23.7*  HGB 9.4* 9.3* 9.7* 10.0*  HCT 32.0* 31.0* 32.0* 32.8*  MCV 82.5 80.7 80.8 80.6  PLT 215 228 230 [id number]   Basic Metabolic Panel: Recent Labs  Lab 12/22/20 0714 12/23/20 0119 12/24/20 0347 12/25/20 0056  NA 133* 135 133*  133* 137  K 4.4 3.4* 3.9  3.9 3.3*  CL 92* 95* 92*  92* 98  CO2 '25 25 27  27 24  '$ GLUCOSE 82 66* 105*  106* 90  BUN 56* 29* 43*  42* 26*  CREATININE 8.20* 4.91* 6.91*  6.83* 4.78*  CALCIUM 8.8* 8.4* 9.1  9.2 8.7*  MG  --   --  1.9 2.0  PHOS  --  1.8* 1.8*  1.8* 1.5*   GFR: Estimated Creatinine Clearance: 18.5 mL/min (A) (by C-G formula based on SCr of 4.78 mg/dL  (H)). Liver Function Tests: Recent Labs  Lab 12/22/20 0714 12/23/20 0119 12/24/20 0347 12/25/20 0056  AST 20  --  21 22  ALT 10  --  10 10  ALKPHOS 74  --  79 81  BILITOT 1.0  --  1.0 1.2  PROT 8.4*  --  8.6* 8.5*  ALBUMIN 2.4* 2.2* 2.2*  2.2* 2.2*   No results for input(s): LIPASE, AMYLASE in the last 168 hours. No results for input(s): AMMONIA in the last 168 hours. Coagulation Profile: Recent Labs  Lab 12/22/20 0714  INR 1.3*   Cardiac Enzymes: No results for input(s): CKTOTAL, CKMB, CKMBINDEX, TROPONINI in the last 168 hours. BNP (last 3 results) No results for input(s): PROBNP in the last 8760 hours. HbA1C: No results for input(s): HGBA1C in the last 72 hours. CBG: Recent Labs  Lab 12/22/20 1402 12/22/20 1804  GLUCAP 83 82   Lipid Profile: No results for input(s): CHOL, HDL, LDLCALC, TRIG, CHOLHDL, LDLDIRECT in the last 72 hours. Thyroid Function Tests: No results for input(s): TSH, T4TOTAL, FREET4, T3FREE, THYROIDAB in the last 72 hours. Anemia Panel: No results for input(s): VITAMINB12, FOLATE, FERRITIN, TIBC, IRON, RETICCTPCT in the last 72 hours. Sepsis Labs: Recent Labs  Lab 12/22/20 [id number]  LATICACIDVEN 1.7    Recent Results (from the past 240 hour(s))  Resp Panel by RT-PCR (Flu A&B, Covid) Nasopharyngeal Swab     Status: None   Collection Time: 12/22/20  7:14 AM   Specimen: Nasopharyngeal Swab; Nasopharyngeal(NP) swabs in vial transport medium  Result Value Ref Range Status  SARS Coronavirus 2 by RT PCR NEGATIVE NEGATIVE Final    Comment: (NOTE) SARS-CoV-2 target nucleic acids are NOT DETECTED.  The SARS-CoV-2 RNA is generally detectable in upper respiratory specimens during the acute phase of infection. The lowest concentration of SARS-CoV-2 viral copies this assay can detect is 138 copies/mL. A negative result does not preclude SARS-Cov-2 infection and should not be used as the sole basis for treatment or other patient management decisions.  A negative result may occur with  improper specimen collection/handling, submission of specimen other than nasopharyngeal swab, presence of viral mutation(s) within the areas targeted by this assay, and inadequate number of viral copies(<138 copies/mL). A negative result must be combined with clinical observations, patient history, and epidemiological information. The expected result is Negative.  Fact Sheet for Patients:  EntrepreneurPulse.com.au  Fact Sheet for Healthcare Providers:  IncredibleEmployment.be  This test is no t yet approved or cleared by the [place] FDA and  has been authorized for detection and/or diagnosis of SARS-CoV-2 by FDA under an Emergency Use Authorization (EUA). This EUA will remain  in effect (meaning this test can be used) for the duration of the COVID-19 declaration under Section 564(b)(1) of the Act, 21 U.S.C.section 360bbb-3(b)(1), unless the authorization is terminated  or revoked sooner.       Influenza A by PCR NEGATIVE NEGATIVE Final   Influenza B by PCR NEGATIVE NEGATIVE Final    Comment: (NOTE) The Xpert Xpress SARS-CoV-2/FLU/RSV plus assay is intended as an aid in the diagnosis of influenza from Nasopharyngeal swab specimens and should not be used as a sole basis for treatment. Nasal washings and aspirates are unacceptable for Xpert Xpress SARS-CoV-2/FLU/RSV testing.  Fact Sheet for Patients: EntrepreneurPulse.com.au  Fact Sheet for Healthcare Providers: IncredibleEmployment.be  This test is not yet approved or cleared by the [place] FDA and has been authorized for detection and/or diagnosis of SARS-CoV-2 by FDA under an Emergency Use Authorization (EUA). This EUA will remain in effect (meaning this test can be used) for the duration of the COVID-19 declaration under Section 564(b)(1) of the Act, 21 U.S.C. section 360bbb-3(b)(1), unless the authorization  is terminated or revoked.  Performed at [facility], [place] [address], [place], [place] [address]   Blood Culture (routine x 2)     Status: Abnormal   Collection Time: 12/22/20  7:14 AM   Specimen: BLOOD  Result Value Ref Range Status   Specimen Description BLOOD SITE NOT SPECIFIED  Final   Special Requests   Final    BOTTLES DRAWN AEROBIC AND ANAEROBIC Blood Culture adequate volume   Culture  Setup Time   Final    GRAM POSITIVE COCCI IN CLUSTERS IN BOTH AEROBIC AND ANAEROBIC BOTTLES CRITICAL RESULT CALLED TO, READ BACK BY AND VERIFIED WITHDion Body [facility] 2028 12/22/20 A BROWNING Performed at [facility], [place] [address], [place], [place] [address]    Culture METHICILLIN RESISTANT STAPHYLOCOCCUS AUREUS (A)  Final   Report Status 12/24/2020 FINAL  Final   Organism ID, Bacteria METHICILLIN RESISTANT STAPHYLOCOCCUS AUREUS  Final      Susceptibility   Methicillin resistant staphylococcus aureus - MIC*    CIPROFLOXACIN 4 RESISTANT Resistant     ERYTHROMYCIN >=8 RESISTANT Resistant     GENTAMICIN <=0.5 SENSITIVE Sensitive     OXACILLIN >=4 RESISTANT Resistant     TETRACYCLINE <=1 SENSITIVE Sensitive     VANCOMYCIN 1 SENSITIVE Sensitive     TRIMETH/SULFA <=10 SENSITIVE Sensitive     CLINDAMYCIN <=  0.25 SENSITIVE Sensitive     RIFAMPIN <=0.5 SENSITIVE Sensitive     Inducible Clindamycin NEGATIVE Sensitive     * METHICILLIN RESISTANT STAPHYLOCOCCUS AUREUS  Blood Culture ID Panel (Reflexed)     Status: Abnormal   Collection Time: 12/22/20  7:14 AM  Result Value Ref Range Status   Enterococcus faecalis NOT DETECTED NOT DETECTED Final   Enterococcus Faecium NOT DETECTED NOT DETECTED Final   Listeria monocytogenes NOT DETECTED NOT DETECTED Final   Staphylococcus species DETECTED (A) NOT DETECTED Final    Comment: CRITICAL RESULT CALLED TO, READ BACK BY AND VERIFIED WITH: J [facility] PHARMD 2028 12/22/20 A BROWNING    Staphylococcus aureus (BCID) DETECTED (A) NOT DETECTED Final     Comment: Methicillin (oxacillin)-resistant Staphylococcus aureus (MRSA). MRSA is predictably resistant to beta-lactam antibiotics (except ceftaroline). Preferred therapy is vancomycin unless clinically contraindicated. Patient requires contact precautions if  hospitalized. CRITICAL RESULT CALLED TO, READ BACK BY AND VERIFIED WITH: Dion Body PHARMD 2028 12/22/20 A BROWNING    Staphylococcus epidermidis NOT DETECTED NOT DETECTED Final   Staphylococcus lugdunensis NOT DETECTED NOT DETECTED Final   Streptococcus species NOT DETECTED NOT DETECTED Final   Streptococcus agalactiae NOT DETECTED NOT DETECTED Final   Streptococcus pneumoniae NOT DETECTED NOT DETECTED Final   Streptococcus pyogenes NOT DETECTED NOT DETECTED Final   A.calcoaceticus-baumannii NOT DETECTED NOT DETECTED Final   Bacteroides fragilis NOT DETECTED NOT DETECTED Final   Enterobacterales NOT DETECTED NOT DETECTED Final   Enterobacter cloacae complex NOT DETECTED NOT DETECTED Final   Escherichia coli NOT DETECTED NOT DETECTED Final   Klebsiella aerogenes NOT DETECTED NOT DETECTED Final   Klebsiella oxytoca NOT DETECTED NOT DETECTED Final   Klebsiella pneumoniae NOT DETECTED NOT DETECTED Final   Proteus species NOT DETECTED NOT DETECTED Final   Salmonella species NOT DETECTED NOT DETECTED Final   Serratia marcescens NOT DETECTED NOT DETECTED Final   Haemophilus influenzae NOT DETECTED NOT DETECTED Final   Neisseria meningitidis NOT DETECTED NOT DETECTED Final   Pseudomonas aeruginosa NOT DETECTED NOT DETECTED Final   Stenotrophomonas maltophilia NOT DETECTED NOT DETECTED Final   Candida albicans NOT DETECTED NOT DETECTED Final   Candida auris NOT DETECTED NOT DETECTED Final   Candida glabrata NOT DETECTED NOT DETECTED Final   Candida krusei NOT DETECTED NOT DETECTED Final   Candida parapsilosis NOT DETECTED NOT DETECTED Final   Candida tropicalis NOT DETECTED NOT DETECTED Final   Cryptococcus neoformans/gattii NOT  DETECTED NOT DETECTED Final   Meth resistant mecA/C and MREJ DETECTED (A) NOT DETECTED Final    Comment: CRITICAL RESULT CALLED TO, READ BACK BY AND VERIFIED WITHDion Body [facility] 2028 12/22/20 A BROWNING Performed at [facility], [address], [place], [place] [address]   Blood Culture (routine x 2)     Status: Abnormal   Collection Time: 12/22/20  7:19 AM   Specimen: BLOOD  Result Value Ref Range Status   Specimen Description BLOOD SITE NOT SPECIFIED  Final   Special Requests   Final    BOTTLES DRAWN AEROBIC AND ANAEROBIC Blood Culture results may not be optimal due to an excessive volume of blood received in culture bottles   Culture  Setup Time   Final    GRAM POSITIVE COCCI IN CLUSTERS IN BOTH AEROBIC AND ANAEROBIC BOTTLES IDENTIFICATION TO FOLLOW CRITICAL RESULT CALLED TO, READ BACK BY AND VERIFIED WITH: J [facility] PHARMD 2028 12/22/20 A BROWNING    Culture (A)  Final    STAPHYLOCOCCUS AUREUS  SUSCEPTIBILITIES PERFORMED ON PREVIOUS CULTURE WITHIN THE LAST 5 DAYS. Performed at [facility], [place] [address], [place], [place] [address]    Report Status 12/24/2020 FINAL  Final     RN Pressure Injury Documentation:     Estimated body mass index is 20.9 kg/m as calculated from the following:   Height as of this encounter: 6' (1.829 m).   Weight as of this encounter: 69.9 kg.  Malnutrition Type:   Malnutrition Characteristics:   Nutrition Interventions:   Radiology Studies: MR BRAIN WO CONTRAST  Addendum Date: 12/25/2020   ADDENDUM REPORT: 12/25/2020 03:54 ADDENDUM: The pattern is most consistent with an embolic process. Electronically Signed   By: Ulyses Jarred M.D.   On: 12/25/2020 03:54   Result Date: 12/25/2020 CLINICAL DATA:  Diplopia EXAM: MRI HEAD WITHOUT CONTRAST TECHNIQUE: Multiplanar, multiecho pulse sequences of the brain and surrounding structures were obtained without intravenous contrast. COMPARISON:  09/08/2020 FINDINGS: Brain: Numerous punctate  foci of abnormal diffusion restriction within both hemispheres in multiple vascular territories. Multiple scattered microhemorrhages in a nonspecific pattern. There is multifocal hyperintense T2-weighted signal within the white matter. Parenchymal volume and CSF spaces are normal. The midline structures are normal. Vascular: Major flow voids are preserved. Skull and upper cervical spine: Normal calvarium and skull base. Visualized upper cervical spine and soft tissues are normal. Sinuses/Orbits:No paranasal sinus fluid levels or advanced mucosal thickening. No mastoid or middle ear effusion. Normal orbits. IMPRESSION: 1. Numerous punctate foci of abnormal diffusion restriction within both hemispheres in multiple vascular territories, consistent with acute infarcts. 2. Multiple scattered microhemorrhages in a nonspecific pattern. Electronically Signed: By: Ulyses Jarred M.D. On: 12/25/2020 03:24   ECHO TEE  Result Date: 12/24/2020    TRANSESOPHOGEAL ECHO REPORT   Patient Name:   SHAKI LADEWIG Date of Exam: 12/24/2020 Medical Rec #:  [id number]      Height:       72.0 in Accession #:    [id number]     Weight:       159.8 lb Date of Birth:  Feb 09, 1971      BSA:          1.937 m Patient Age:    22 years       BP:           155/91 mmHg Patient Gender: M              HR:           95 bpm. Exam Location:  Inpatient Procedure: 2D Echo, Cardiac Doppler, Color Doppler and Transesophageal Echo Indications:     Bacteremia  History:         Patient has prior history of Echocardiogram examinations, most                  recent 12/22/2020.                   Mitral Valve: bioprosthetic valve valve is present in the                  mitral position.  Sonographer:     Merrie Roof RDCS Referring Phys:  [id number] [place] [facility] Diagnosing Phys: Lyman Bishop MD PROCEDURE: After discussion of the risks and benefits of a TEE, an informed consent was obtained from the patient. The transesophogeal probe was passed without difficulty  through the esophogus of the patient. Local oropharyngeal anesthetic was provided with Cetacaine. Sedation performed by different physician. The patient was  monitored while under deep sedation. The patient's vital signs; including heart rate, blood pressure, and oxygen saturation; remained stable throughout the procedure. The patient developed no complications during the procedure. IMPRESSIONS  1. Left ventricular ejection fraction, by estimation, is 60 to 65%. The left ventricle has normal function. The left ventricle has no regional wall motion abnormalities. There is moderate left ventricular hypertrophy.  2. Right ventricular systolic function is normal. The right ventricular size is severely enlarged. Mildly increased right ventricular wall thickness.  3. No left atrial/left atrial appendage thrombus was detected.  4. Right atrial size was severely dilated.  5. The mitral valve has been repaired/replaced. Trivial mitral valve regurgitation. There is a bioprosthetic valve present in the mitral position. Echo findings are consistent with vegetation the mitral prosthesis.  6. Aortic valve vegetation is visualized on the left.  7. The aortic valve is tricuspid. Aortic valve regurgitation is not visualized.  8. There is mild (Grade II) plaque. Conclusion(s)/Recommendation(s): Findings are concerning for vegetation/infective endocarditis as detailed above. FINDINGS  Left Ventricle: Left ventricular ejection fraction, by estimation, is 60 to 65%. The left ventricle has normal function. The left ventricle has no regional wall motion abnormalities. The left ventricular internal cavity size was normal in size. There is  moderate left ventricular hypertrophy. Right Ventricle: The right ventricular size is severely enlarged. Mildly increased right ventricular wall thickness. Right ventricular systolic function is normal. Left Atrium: Left atrial size was normal in size. No left atrial/left atrial appendage thrombus was  detected. Right Atrium: Right atrial size was severely dilated. Pericardium: There is no evidence of pericardial effusion. Mitral Valve: The mitral valve has been repaired/replaced. Trivial mitral valve regurgitation. There is a bioprosthetic valve present in the mitral position. Echo findings are consistent with vegetation on the mitral prosthesis. Tricuspid Valve: The tricuspid valve is grossly normal. Tricuspid valve regurgitation is mild. Aortic Valve: The aortic valve is tricuspid. Aortic valve regurgitation is not visualized. A mobile vegetation is seen on the left. The AoV vegetation measures 5 mm x 5 mm. Pulmonic Valve: The pulmonic valve was grossly normal. Pulmonic valve regurgitation is trivial. Aorta: The aortic root and ascending aorta are structurally normal, with no evidence of dilitation. There is mild (Grade II) plaque. IAS/Shunts: No atrial level shunt detected by color flow Doppler. Lyman Bishop MD Electronically signed by Lyman Bishop MD Signature Date/Time: 12/24/2020/3:38:39 PM    Final    Scheduled Meds: . (feeding supplement) PROSource Plus  30 mL Oral BID BM  . Chlorhexidine Gluconate Cloth  6 each Topical Q0600  . Chlorhexidine Gluconate Cloth  6 each Topical Q0600  . cinacalcet  60 mg Oral Q supper  . docusate sodium  100 mg Oral BID  . feeding supplement (NEPRO CARB STEADY)  237 mL Oral TID BM  . methocarbamol  500 mg Oral TID  . multivitamin with minerals  1 tablet Oral Daily  . oxyCODONE  15 mg Oral Q12H  . pantoprazole  40 mg Oral Daily  . pregabalin  75 mg Oral QHS  . senna-docusate  1 tablet Oral BID  . sodium chloride flush  3 mL Intravenous Q12H   Continuous Infusions: . [START ON 12/27/2020] gentamicin    . vancomycin 750 mg (12/24/20 1657)    LOS: 3 days   Kerney Elbe, DO [facility] PAGER is on [place]  If 7PM-7AM, please contact night-coverage [email or web address]

## 2020-12-25 NOTE — Progress Notes (Addendum)
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 Alert, no new complaints this am.   Objective Vitals:   12/25/20 0315 12/25/20 0319 12/25/20 0741 12/25/20 0930  BP: (!) 129/99  (!) 146/103   Pulse: 99  98   Resp: 18  18   Temp: 97.8 F (36.6 C)  98.1 F (36.7 C)   TempSrc: Oral  Oral   SpO2: 98%  98% 98%  Weight:  69.9 kg    Height:       Physical Exam General: alert, oriented x3, male patient in NAD, sitting up in bed Heart:RRR, no mrg Lungs:mostly CTAB, +crackles in RLL, ml WOB on RA Abdomen:soft, NTND Extremities: R BKA, no edema Dialysis Access: LU AVG in use   Filed Weights   12/24/20 0755 12/24/20 1144 12/25/20 0319  Weight: 76.4 kg 72.5 kg 69.9 kg    Intake/Output Summary (Last 24 hours) at 12/25/2020 1014 Last data filed at 12/25/2020 0930 Gross per 24 hour  Intake 1216.32 ml  Output 3000 ml  Net -1783.68 ml    Additional Objective Labs: Basic Metabolic Panel: Recent Labs  Lab 12/23/20 0119 12/24/20 0347 12/25/20 0056  NA 135 133*  133* 137  K 3.4* 3.9  3.9 3.3*  CL 95* 92*  92* 98  CO2 '25 27  27 24  '$ GLUCOSE 66* 105*  106* 90  BUN 29* 43*  42* 26*  CREATININE 4.91* 6.91*  6.83* 4.78*  CALCIUM 8.4* 9.1  9.2 8.7*  PHOS 1.8* 1.8*  1.8* 1.5*   Liver Function Tests: Recent Labs  Lab 12/22/20 0714 12/23/20 0119 12/24/20 0347 12/25/20 0056  AST 20  --  21 22  ALT 10  --  10 10  ALKPHOS 74  --  79 81  BILITOT 1.0  --  1.0 1.2  PROT 8.4*  --  8.6* 8.5*  ALBUMIN 2.4* 2.2* 2.2*  2.2* 2.2*   CBC: Recent Labs  Lab 12/22/20 0714 12/23/20 0119 12/24/20 0347 12/25/20 0056  WBC 20.2* 19.7* 21.1* 28.9*  NEUTROABS 18.0*  --  16.9* 23.7*  HGB 9.4* 9.3* 9.7* 10.0*  HCT 32.0* 31.0* 32.0* 32.8*  MCV 82.5 80.7 80.8 80.6  PLT 215 228 230 262   Blood Culture    Component Value Date/Time   SDES BLOOD SITE NOT SPECIFIED  12/22/2020 0719   SPECREQUEST  12/22/2020 0719    BOTTLES DRAWN AEROBIC AND ANAEROBIC Blood Culture results may not be optimal due to an excessive volume of blood received in culture bottles   CULT (A) 12/22/2020 0719    STAPHYLOCOCCUS AUREUS SUSCEPTIBILITIES PERFORMED ON PREVIOUS CULTURE WITHIN THE LAST 5 DAYS. Performed at [facility], [place] [address] [place], [place] [address]    REPTSTATUS 12/24/2020 FINAL 12/22/2020 0719    Studies/Results: MR BRAIN WO CONTRAST  Addendum Date: 12/25/2020   ADDENDUM REPORT: 12/25/2020 03:54 ADDENDUM: The pattern is most consistent with an embolic process. Electronically Signed   By: Ulyses Jarred M.D.   On: 12/25/2020 03:54   Result Date: 12/25/2020 CLINICAL DATA:  Diplopia EXAM: MRI HEAD WITHOUT CONTRAST TECHNIQUE: Multiplanar, multiecho pulse sequences of the brain and surrounding structures were obtained without intravenous contrast. COMPARISON:  09/08/2020 FINDINGS: Brain: Numerous punctate foci of abnormal diffusion restriction within both hemispheres in multiple vascular territories. Multiple scattered microhemorrhages in a nonspecific pattern. There is multifocal hyperintense T2-weighted signal within  the white matter. Parenchymal volume and CSF spaces are normal. The midline structures are normal. Vascular: Major flow voids are preserved. Skull and upper cervical spine: Normal calvarium and skull base. Visualized upper cervical spine and soft tissues are normal. Sinuses/Orbits:No paranasal sinus fluid levels or advanced mucosal thickening. No mastoid or middle ear effusion. Normal orbits. IMPRESSION: 1. Numerous punctate foci of abnormal diffusion restriction within both hemispheres in multiple vascular territories, consistent with acute infarcts. 2. Multiple scattered microhemorrhages in a nonspecific pattern. Electronically Signed: By: Ulyses Jarred M.D. On: 12/25/2020 03:24   ECHO TEE  Result Date: 12/24/2020    TRANSESOPHOGEAL ECHO REPORT    Patient Name:   NICHLAS MORELAND Date of Exam: 12/24/2020 Medical Rec #:  [id number]      Height:       72.0 in Accession #:    [id number]     Weight:       159.8 lb Date of Birth:  November 08, 1970      BSA:          1.937 m Patient Age:    50 years       BP:           155/91 mmHg Patient Gender: M              HR:           95 bpm. Exam Location:  Inpatient Procedure: 2D Echo, Cardiac Doppler, Color Doppler and Transesophageal Echo Indications:     Bacteremia  History:         Patient has prior history of Echocardiogram examinations, most                  recent 12/22/2020.                   Mitral Valve: bioprosthetic valve valve is present in the                  mitral position.  Sonographer:     Merrie Roof RDCS Referring Phys:  [id number] [facility] Diagnosing Phys: Lyman Bishop MD PROCEDURE: After discussion of the risks and benefits of a TEE, an informed consent was obtained from the patient. The transesophogeal probe was passed without difficulty through the esophogus of the patient. Local oropharyngeal anesthetic was provided with Cetacaine. Sedation performed by different physician. The patient was monitored while under deep sedation. The patient's vital signs; including heart rate, blood pressure, and oxygen saturation; remained stable throughout the procedure. The patient developed no complications during the procedure. IMPRESSIONS  1. Left ventricular ejection fraction, by estimation, is 60 to 65%. The left ventricle has normal function. The left ventricle has no regional wall motion abnormalities. There is moderate left ventricular hypertrophy.  2. Right ventricular systolic function is normal. The right ventricular size is severely enlarged. Mildly increased right ventricular wall thickness.  3. No left atrial/left atrial appendage thrombus was detected.  4. Right atrial size was severely dilated.  5. The mitral valve has been repaired/replaced. Trivial mitral valve regurgitation. There is a  bioprosthetic valve present in the mitral position. Echo findings are consistent with vegetation the mitral prosthesis.  6. Aortic valve vegetation is visualized on the left.  7. The aortic valve is tricuspid. Aortic valve regurgitation is not visualized.  8. There is mild (Grade II) plaque. Conclusion(s)/Recommendation(s): Findings are concerning for vegetation/infective endocarditis as detailed above. FINDINGS  Left Ventricle: Left ventricular ejection fraction, by estimation, is 60 to 65%. The left  ventricle has normal function. The left ventricle has no regional wall motion abnormalities. The left ventricular internal cavity size was normal in size. There is  moderate left ventricular hypertrophy. Right Ventricle: The right ventricular size is severely enlarged. Mildly increased right ventricular wall thickness. Right ventricular systolic function is normal. Left Atrium: Left atrial size was normal in size. No left atrial/left atrial appendage thrombus was detected. Right Atrium: Right atrial size was severely dilated. Pericardium: There is no evidence of pericardial effusion. Mitral Valve: The mitral valve has been repaired/replaced. Trivial mitral valve regurgitation. There is a bioprosthetic valve present in the mitral position. Echo findings are consistent with vegetation on the mitral prosthesis. Tricuspid Valve: The tricuspid valve is grossly normal. Tricuspid valve regurgitation is mild. Aortic Valve: The aortic valve is tricuspid. Aortic valve regurgitation is not visualized. A mobile vegetation is seen on the left. The AoV vegetation measures 5 mm x 5 mm. Pulmonic Valve: The pulmonic valve was grossly normal. Pulmonic valve regurgitation is trivial. Aorta: The aortic root and ascending aorta are structurally normal, with no evidence of dilitation. There is mild (Grade II) plaque. IAS/Shunts: No atrial level shunt detected by color flow Doppler. Lyman Bishop MD Electronically signed by Lyman Bishop MD  Signature Date/Time: 12/24/2020/3:38:39 PM    Final     Medications: . [START ON 12/27/2020] gentamicin    . vancomycin 750 mg (12/24/20 1657)   . (feeding supplement) PROSource Plus  30 mL Oral BID BM  . Chlorhexidine Gluconate Cloth  6 each Topical Q0600  . Chlorhexidine Gluconate Cloth  6 each Topical Q0600  . cinacalcet  60 mg Oral Q supper  . docusate sodium  100 mg Oral BID  . feeding supplement (NEPRO CARB STEADY)  237 mL Oral TID BM  . methocarbamol  500 mg Oral TID  . multivitamin with minerals  1 tablet Oral Daily  . oxyCODONE  15 mg Oral Q12H  . pantoprazole  40 mg Oral Daily  . pregabalin  75 mg Oral QHS  . senna-docusate  1 tablet Oral BID  . sodium chloride flush  3 mL Intravenous Q12H    Dialysis Orders: MWF -[facility] 4hrs, BFR350, DFR500, EDW 72kg,2K/2CaProfile 2  Access:LU AVG Heparin5500 unit qHD Mircera222mg q2wks - last 5/9 Venofer '100mg'$  qHD x5 - 1 completed  Assessment/Plan: 1. AMS/transient vision loss -  Secondary to #2. MRI brain 5/21 with multiple embolic strokes - neurology following.  2. 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  WBCs 28. On Gentamicin and vancomycin per pharmacy. s/p repeat TEE with MV vegetations.  For  MRI thoracic spine.  Per primary team. ID following.  3. Back pain - MRI lumbar spine with no discitis/osteomyelitis, +degenerative disc disease at L4-L5, L5-S1. ID plans for MRI T spine. 4. Hypoxia- resolved.  Now on RA.  5. ESRD- On HD MWF. Continue on schedule. Next HD 5/23  6. Hypertension/volume- BP elevated. Resume home meds. Under dry weight if weights correct. UF has been limited by hypotension. Tolerated 3L UF on Friday  7. Anemiaof CKD- Hgb 10 - Next ESA dose due 5/23. .Marland KitchenHold IV iron due to acute infection. 8. Secondary Hyperparathyroidism -Ca in goal. Phos low - binders stopped.  Continue sensipar '60mg'$  qd. Not on  VDRA. 9. Nutrition- Renal diet w/fluid restriction. Protein supplement.   OLynnda ChildPA-C [facility] 12/25/2020,10:14 AM  Nephrology attending: Patient was seen  and examined.  Chart reviewed.  I agree with assessment and plan as outlined above. Multiple septic emboli to the brain and MRSA bacteremia.  On gentamicin and vancomycin per ID.  Neurology following.  He had dialysis yesterday and tolerated well.  Plan for next HD on 5/23.  Katheran James, MD [facility].

## 2020-12-25 NOTE — Progress Notes (Signed)
Around 2000: Patient developed blurry vision while eating dinner. VSS. No fever. While assessing patient started to shake like he was shivering and kept stating he is cold. HR went from 90-low 100s to 130-150s and sustaining. Warm blankets placed on patient. Provider paged.   Around 2015: EKG performed and showed Sinus tach.   2030:Lopressor '5mg'$  given one time. MRI brain ordered.   HR started to improve. Moments later patient stated he was fine and wanted to finish dinner. C/o th room being too hot. Patient given a cold bath and finished dinner.   Around 0100: Patient went to MRI.  Around 0300: Provider paged about MRI results (acute infarcts). Per provider he will page neurology.

## 2020-12-25 NOTE — Progress Notes (Signed)
   12/25/20 2205  Assess: MEWS Score  Temp 99.6 F (37.6 C)  BP (!) 159/76  Pulse Rate (!) 118  ECG Heart Rate (!) 118  Resp 18  SpO2 95 %  O2 Device Room Air  Assess: MEWS Score  MEWS Temp 0  MEWS Systolic 0  MEWS Pulse 2  MEWS RR 0  MEWS LOC 0  MEWS Score 2  MEWS Score Color Yellow  Assess: if the MEWS score is Yellow or Red  Were vital signs taken at a resting state? Yes  Focused Assessment No change from prior assessment  Early Detection of Sepsis Score *See Row Information* Low  MEWS guidelines implemented *See Row Information* Yes  Treat  MEWS Interventions Escalated (See documentation below) (given one time dose of lopressor)  Take Vital Signs  Increase Vital Sign Frequency  Yellow: Q 2hr X 2 then Q 4hr X 2, if remains yellow, continue Q 4hrs  Escalate  MEWS: Escalate Yellow: discuss with charge nurse/RN and consider discussing with provider and RRT  Notify: Charge Nurse/RN  Name of Charge Nurse/RN Notified Kristine,RN  Date Charge Nurse/RN Notified 12/25/20  Time Charge Nurse/RN Notified 2215  Notify: Provider  Provider Name/Title Chotiner,MD  Date Provider Notified 12/25/20  Time Provider Notified 2100  Notification Type Page  Notification Reason Other (Comment) (High BP and HR)  Provider response See new orders (one time dose of lopressor)  Date of Provider Response 12/25/20  Time of Provider Response 2105  Document  Patient Outcome Stabilized after interventions  Progress note created (see row info) Yes   After given lopressor. BP and HR has lowered. Will continue to monitor. Patient has on going infection and history of HTN. Taken off BP meds when admitted due to hypotension. Chotiner, MD will be letting day team know that he may need to be started back on his BP meds due to the increase in BP throughout the day.

## 2020-12-25 NOTE — Plan of Care (Signed)

## 2020-12-25 NOTE — Consult Note (Signed)
Neurology Consultation Reason for Consult: Strokes on MRI Requesting Physician: Harrold Donath   CC: Blurry vision  History is obtained from: Patient and chart review as well as primary team  HPI: Patrick Anthony is a 50 y.o. male with a past medical history significant for end-stage renal disease (on IHD M/W/F), medication nonadherence, hep C liver cirrhosis, past alcohol use, diastolic congestive heart failure, hyperlipidemia, hypertension, sleep apnea  He was recently admitted from 2/21 through 11/10/2020, found to have MRSA bacteremia complicated by septic emboli to the brain as well as left upper lobe cavitary mass.  He had a mitral valve replacement with a bioprosthetic as well as PFO closure on 3/10 for mitral valve infective endocarditis.  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  Antibiotics were meant to be continued at this facility through 4/21 [facility]).  He came to the ED on 5/18 due to 3 days of fevers and altered mental status.  Unfortunately he re-presented on 12/22/2020 with recurrent endocarditis (TEE confirmation of bioprosthetic mitral valve involvement on 5/20)  He continued to have intermittent fevers and blood cultures from 5/18 identified staph aureus.  He has had a rising leukocytosis and was started on vancomycin and gentamicin on 5/20 after receiving a single dose of vancomycin and cefepime on 5/18.  His fever curve appears to have responded to this intervention.    He had a transient visual change on 5/20 in the evening about 8 PM for which MRI was obtained which demonstrated recurrent concern for septic emboli.  Neurology was consulted.  He reports he has had intermittent blurry vision mostly in his right eye.  He denies headache or double vision.  He denies changes in his hearing.  He has no focal numbness or weakness.  He does report some right flank cramping pain.   LKW: 8 PM tPA  given?: No, infective endocarditis and recent strokes Premorbid modified rankin scale:     3 - Moderate disability. Requires some help, but able to walk unassisted.  ROS: All other review of systems was negative except as noted in the HPI.   Past Medical History:  Diagnosis Date  . 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 Chronic kidney disease    patient on dialysis tues-thurs-sat  . Cirrhosis of liver ([place])   . Drug addiction ([place])   . ETOH abuse   . Hepatitis   . Hypertension   . Mitral regurgitation   . Patent foramen ovale 10/23/2019  . Renal disorder renal failure  . S/P minimally-invasive mitral valve replacement with bioprosthetic valve 10/14/2020   31 mm Medtronic Mosaic stented porcine bioprosthetic tissue valve via right mini thoracotomy approach  . Sleep apnea   . Systolic and diastolic CHF, 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[place]) 04/19/2013   Past Surgical History:  Procedure Laterality Date  . AMPUTATION Right 10/29/2020   Procedure: RIGHT ABOVE KNEE AMPUTATION;  Surgeon: [name], MD;  Location: [place];  Service: Orthopedics;  Laterality: Right;  . AV FISTULA PLACEMENT Right   . AV FISTULA PLACEMENT Left 09/03/2013   Procedure: ARTERIOVENOUS (AV) FISTULA CREATION - LEFT BRACHIOCEPHALIC;  Surgeon: Conrad Wellsboro, MD;  Location: [place];  Service: Vascular;  Laterality: Left;  . AV FISTULA PLACEMENT Left 10/30/2019   Procedure: INSERTION OF ARTERIOVENOUS (AV) GORE-TEX GRAFT ARM;  Surgeon: Rosetta Posner, MD;  Location: [place];  Service: Vascular;  Laterality: Left;  . BUBBLE STUDY  10/05/2020   Procedure: BUBBLE STUDY;  Surgeon: Pixie Casino, MD;  Location: [place];  Service: Cardiovascular;;  . I & D EXTREMITY Right 10/20/2020   Procedure: IRRIGATION AND DEBRIDEMENT OF LEG;  Surgeon: [name], MD;  Location: [place];  Service: Orthopedics;  Laterality: Right;  . INSERTION OF DIALYSIS  CATHETER N/A 08/29/2013   Procedure: INSERTION OF DIALYSIS CATHETER;  Surgeon: Mal Misty, MD;  Location: [place];  Service: Vascular;  Laterality: N/A;  . INSERTION OF DIALYSIS CATHETER Left 10/30/2019   Procedure: INSERTION OF DIALYSIS CATHETER LEFT INTERNAL JUGULAR;  Surgeon: Rosetta Posner, MD;  Location: [place];  Service: Vascular;  Laterality: Left;  . LEFT HEART CATH AND CORONARY ANGIOGRAPHY N/A 10/07/2020   Procedure: LEFT HEART CATH AND CORONARY ANGIOGRAPHY;  Surgeon: Lorretta Harp, MD;  Location: [place] [facility];  Service: Cardiovascular;  Laterality: N/A;  . MITRAL VALVE REPAIR Right 10/14/2020   Procedure: MINIMALLY INVASIVE MITRAL VALVE  REPLACEMENT(MVR) USING MEDTRONIC MOSAIC VALVE SIZE 31MM;  Surgeon: Rexene Alberts, MD;  Location: [place];  Service: Open Heart Surgery;  Laterality: Right;  . MULTIPLE EXTRACTIONS WITH ALVEOLOPLASTY N/A 10/12/2020   Procedure: MULTIPLE EXTRACTION WITH ALVEOLOPLASTY;  Surgeon: [name], DMD;  Location: [place];  Service: Dentistry;  Laterality: N/A;  . PERIPHERAL VASCULAR CATHETERIZATION N/A 01/06/2015   Procedure: A/V Shuntogram/Fistulagram;  Surgeon: Katha Cabal, MD;  Location: [place] [facility];  Service: Cardiovascular;  Laterality: N/A;  . PERIPHERAL VASCULAR CATHETERIZATION Left 01/06/2015   Procedure: A/V Shunt Intervention;  Surgeon: Katha Cabal, MD;  Location: [place] [facility];  Service: Cardiovascular;  Laterality: Left;  . REPAIR OF PATENT FORAMEN OVALE N/A 10/14/2020   Procedure: CLOSURE OF PATENT FORAMEN OVALE;  Surgeon: Rexene Alberts, MD;  Location: [place];  Service: Open Heart Surgery;  Laterality: N/A;  . TEE WITHOUT CARDIOVERSION N/A 10/05/2020   Procedure: TRANSESOPHAGEAL ECHOCARDIOGRAM (TEE);  Surgeon: Pixie Casino, MD;  Location: [place];  Service: Cardiovascular;  Laterality: N/A;  . TEE WITHOUT CARDIOVERSION N/A 10/14/2020   Procedure: TRANSESOPHAGEAL ECHOCARDIOGRAM (TEE);  Surgeon: Rexene Alberts,  MD;  Location: [place];  Service: Open Heart Surgery;  Laterality: N/A;    Current Facility-Administered Medications:  .  (feeding supplement) PROSource Plus liquid 30 mL, 30 mL, Oral, BID BM, Hilty, Nadean Corwin, MD .  acetaminophen (TYLENOL) tablet 650 mg, 650 mg, Oral, Q6H PRN, 650 mg at 12/24/20 0502 **OR** acetaminophen (TYLENOL) suppository 650 mg, 650 mg, Rectal, Q6H PRN, Hilty, Nadean Corwin, MD .  albuterol (PROVENTIL) (2.5 MG/3ML) 0.083% nebulizer solution 2.5 mg, 2.5 mg, Nebulization, Q6H PRN, Pixie Casino, MD .  bisacodyl (DULCOLAX) suppository 10 mg, 10 mg, Rectal, Daily PRN, Pixie Casino, MD .  Chlorhexidine Gluconate Cloth 2 % PADS 6 each, 6 each, Topical, Q0600, Pixie Casino, MD, 6 each at 12/24/20 0549 .  Chlorhexidine Gluconate Cloth 2 % PADS 6 each, 6 each, Topical, Q0600, Pixie Casino, MD, 6 each at 12/24/20 [phone]  cinacalcet (SENSIPAR) tablet 60 mg, 60 mg, Oral, Q supper, Hilty, Nadean Corwin, MD, 60 mg at 12/24/20 1555 .  docusate sodium (COLACE) capsule 100 mg, 100 mg, Oral, BID, Hilty, Nadean Corwin, MD, 100 mg at 12/23/20 2057 .  feeding supplement (NEPRO CARB STEADY) liquid 237 mL, 237 mL, Oral, TID BM, Hilty, Chrissie Noa  C, MD, 237 mL at 12/24/20 1524 .  [START ON 12/27/2020] gentamicin (GARAMYCIN) IVPB 80 mg, 80 mg, Intravenous, Q M,W,F-1800, Susa Raring, RPH .  heparin injection 5,000 Units, 5,000 Units, Subcutaneous, Q8H, Hilty, Nadean Corwin, MD, 5,000 Units at 12/24/20 2114 .  methocarbamol (ROBAXIN) tablet 500 mg, 500 mg, Oral, TID, Hilty, Nadean Corwin, MD, 500 mg at 12/24/20 2113 .  multivitamin with minerals tablet 1 tablet, 1 tablet, Oral, Daily, Hilty, Nadean Corwin, MD, 1 tablet at 12/24/20 1555 .  ondansetron (ZOFRAN) tablet 4 mg, 4 mg, Oral, Q6H PRN **OR** ondansetron (ZOFRAN) injection 4 mg, 4 mg, Intravenous, Q6H PRN, Hilty, Nadean Corwin, MD .  oxyCODONE (OXYCONTIN) 12 hr tablet 15 mg, 15 mg, Oral, Q12H, Hilty, Nadean Corwin, MD, 15 mg at 12/24/20 2113 .  pantoprazole  (PROTONIX) EC tablet 40 mg, 40 mg, Oral, Daily, Hilty, Nadean Corwin, MD, 40 mg at 12/24/20 1555 .  polyethylene glycol (MIRALAX / GLYCOLAX) packet 17 g, 17 g, Oral, Daily PRN, Hilty, Nadean Corwin, MD .  pregabalin (LYRICA) capsule 75 mg, 75 mg, Oral, QHS, Hilty, Nadean Corwin, MD, 75 mg at 12/24/20 2114 .  senna-docusate (Senokot-S) tablet 1 tablet, 1 tablet, Oral, BID, Hilty, Nadean Corwin, MD, 1 tablet at 12/23/20 2057 .  sodium chloride flush (NS) 0.9 % injection 3 mL, 3 mL, Intravenous, Q12H, Hilty, Nadean Corwin, MD, 3 mL at 12/24/20 2118 .  vancomycin (VANCOCIN) IVPB 750 mg/150 ml premix, 750 mg, Intravenous, Q M,W,F-HD, Hilty, Nadean Corwin, MD, Last Rate: 150 mL/hr at 12/24/20 1657, 750 mg at 12/24/20 1657  Facility-Administered Medications Ordered in Other Encounters:  .  regadenoson (LEXISCAN) injection SOLN 0.4 mg, 0.4 mg, Intravenous, Once, Cherlynn Kaiser A, MD .  technetium tetrofosmin (TC-MYOVIEW) injection [id number] millicurie, [id number] millicurie, Intravenous, Once PRN, Cherlynn Kaiser A, MD .  technetium tetrofosmin (TC-MYOVIEW) injection [id number] millicurie, [id number] millicurie, Intravenous, Once PRN, Elouise Munroe, MD   Family History  Problem Relation Age of Onset  . Hypertension Mother   . Cancer Mother        breast   Social History:  reports that he quit smoking about 3 years ago. His smoking use included cigarettes. He smoked 0.20 packs per day. He has never used smokeless tobacco. He reports that he does not drink alcohol and does not use drugs.   Exam: Current vital signs: BP (!) 129/99 (BP Location: Right Arm)   Pulse 99   Temp 97.8 F (36.6 C) (Oral)   Resp 18   Ht 6' (1.829 m)   Wt 69.9 kg   SpO2 98%   BMI 20.90 kg/m  Vital signs in last 24 hours: Temp:  [97.7 F (36.5 C)-101.7 F (38.7 C)] 97.8 F (36.6 C) (05/21 0315) Pulse Rate:  [91-132] 99 (05/21 0315) Resp:  [13-98] 18 (05/21 0315) BP: (99-141)/(70-99) 129/99 (05/21 0315) SpO2:  [94 %-100 %] 98 % (05/21 0315) Weight:   [69.9 kg-77.3 kg] 69.9 kg (05/21 0319)   Physical Exam  Constitutional: Appears well-developed with some muscle wasting predominantly in the lower extremities Psych: Affect anxious but cooperative and appropriate Eyes: No scleral injection HENT: No oropharyngeal obstruction.  Poor dentition MSK: Right AKA Cardiovascular: Normal rate and regular rhythm.  Respiratory: Effort normal, non-labored breathing GI: Soft.  No distension. There is no tenderness.  Skin: AK incision seems to be healing well.  Visible skin otherwise intact  Neuro: Mental Status: Patient is awake, alert, oriented to person, place, month, year, and situation. Patient is able to  give a clear and coherent history. No signs of aphasia or neglect Cranial Nerves: II: Visual Fields are full.  III,IV, VI: EOMI without ptosis or diploplia.  V: Facial sensation is symmetric to light touch VII: Facial movement is symmetric.  VIII: hearing is intact to voice X: Uvula elevates symmetrically XI: Shoulder shrug is symmetric. XII: tongue is midline without atrophy or fasciculations.  Motor: Tone is normal. Bulk is normal. 5/5 strength was present in all 3 extremities, other than 4/5 hip flexion in the left leg.  He does have some mild intermittent asterixis Sensory: Sensation is symmetric to light touch in the arms and legs. Deep Tendon Reflexes: 2+ and symmetric in the biceps Cerebellar: FNF and HKS are intact bilaterally  NIHSS total 0   I have reviewed labs in epic and the results pertinent to this consultation are:  Basic Metabolic Panel: Recent Labs  Lab 12/22/20 0714 12/23/20 0119 12/24/20 0347 12/25/20 0056  NA 133* 135 133*  133* 137  K 4.4 3.4* 3.9  3.9 3.3*  CL 92* 95* 92*  92* 98  CO2 '25 25 27  27 24  '$ GLUCOSE 82 66* 105*  106* 90  BUN 56* 29* 43*  42* 26*  CREATININE 8.20* 4.91* 6.91*  6.83* 4.78*  CALCIUM 8.8* 8.4* 9.1  9.2 8.7*  MG  --   --  1.9 2.0  PHOS  --  1.8* 1.8*  1.8* 1.5*     CBC: Recent Labs  Lab 12/22/20 0714 12/23/20 0119 12/24/20 0347 12/25/20 0056  WBC 20.2* 19.7* 21.1* 28.9*  NEUTROABS 18.0*  --  16.9* 23.7*  HGB 9.4* 9.3* 9.7* 10.0*  HCT 32.0* 31.0* 32.0* 32.8*  MCV 82.5 80.7 80.8 80.6  PLT 215 228 230 262    Coagulation Studies: Recent Labs    12/22/20 0714  LABPROT 15.9*  INR 1.3*    Lab Results  Component Value Date   HGBA1C 5.5 09/29/2020   Lab Results  Component Value Date   CHOL 126 09/29/2020   HDL <10 (L) 09/29/2020   LDLCALC NOT CALCULATED 09/29/2020   LDLDIRECT 25.4 09/30/2020   TRIG 342 (H) 09/29/2020   CHOLHDL NOT CALCULATED 09/29/2020    I have reviewed the images obtained:  MRI brain 12/25/2020 personally reviewed, multiple small/punctate strokes in multiple vascular distributions concerning for central embolic source given known MRSA.  There is also fair amount of small scattered hemorrhagic burden  MRA head, MRA neck 09/28/2018 MRI head: 1. 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 Multiple vascular territories are involved and findings are highly suspicious for an embolic process. 2. 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 A few scattered supratentorial chronic microhemorrhages are also present. 3. Mild generalized atrophy of the brain. MRA head: No intracranial large vessel occlusion or proximal high-grade arterial stenosis.  TEE 5/20 1. Left ventricular ejection fraction, by estimation, is 60 to 65%. The  left ventricle has normal function. The left ventricle has no regional  wall motion abnormalities. There is moderate left ventricular hypertrophy.  2. Right ventricular systolic function is normal. The right ventricular  size is severely enlarged. Mildly increased right ventricular wall  thickness.  3. No left atrial/left atrial  appendage thrombus was detected.  4. Right atrial size was severely dilated.  5. The mitral valve has been repaired/replaced. Trivial mitral valve  regurgitation. There is a bioprosthetic valve present in the mitral  position. Echo findings are consistent with vegetation the mitral  prosthesis.  6. Aortic valve vegetation is visualized on the left.  7. The aortic valve is tricuspid. Aortic valve regurgitation is not  visualized.  8. There is mild (Grade II) plaque.  Conclusion(s)/Recommendation(s): Findings are concerning for  vegetation/infective endocarditis as detailed above.   Impression: 50 year old with recurrent infective endocarditis with recurrent punctate multifocal strokes in multiple vascular distributions.  Stroke work-up recently completed and given recent results unlikely to change management at this time.  Given high risk of hemorrhage for this etiology of stroke, recommend discontinuing antiplatelet and pharmacological DVT prophylaxis, at least for now.  Once infection is adequately controlled this may be resumed  Recommendations:  # Recurrent cardioembolic strokes in the setting of septic emboli from infective endocarditis - Stroke labs recently completed, no need to repeat - MRI brain completed as above - MRA of the brain without contrast completed 09/28/2020, no need to repeat - Frequent neuro checks - Echocardiogram does not need to be repeated, TEE completed 5/20 confirming infective endocarditis - Carotid dopplers recently completed 3/4 no need to repeat - Please hold antiplatelets at this time given risk of hemorrhagic conversion of recurrent infective endocarditis related strokes, may resume in 1 week if patient remains stable  - DVT PPx changed from heparin to SCD in the setting of infective endocarditis as well as liver dysfunction leading to mild INR elevation, may resume in 1 week if patient remains stable  - Telemetry monitoring; 30 day event monitor on  discharge if no arrythmias captured  - Blood pressure goal   - Normotension - PT consult, OT consult, Speech consult - Appreciate ID on board for treatment of infective endocarditis - Appreciate nephrology on board for ESRD - Appreciate cardiothoracic surgery consideration of whether repeat valve replacement could be considered/feasible - Please re-page neurology if new questions arise, no further workup indicated at this time.   Lesleigh Noe MD-PhD [facility] [phone] Available 7 PM to 7 AM, outside of these hours please call Neurologist on call as listed on Amion.

## 2020-12-26 ENCOUNTER — Encounter (HOSPITAL_COMMUNITY): Payer: Self-pay | Admitting: Internal Medicine

## 2020-12-26 DIAGNOSIS — B9561 Methicillin susceptible Staphylococcus aureus infection as the cause of diseases classified elsewhere: Secondary | ICD-10-CM | POA: Diagnosis not present

## 2020-12-26 DIAGNOSIS — N186 End stage renal disease: Secondary | ICD-10-CM | POA: Diagnosis not present

## 2020-12-26 DIAGNOSIS — Z89611 Acquired absence of right leg above knee: Secondary | ICD-10-CM | POA: Diagnosis not present

## 2020-12-26 DIAGNOSIS — Z992 Dependence on renal dialysis: Secondary | ICD-10-CM

## 2020-12-26 DIAGNOSIS — Z952 Presence of prosthetic heart valve: Secondary | ICD-10-CM | POA: Diagnosis not present

## 2020-12-26 DIAGNOSIS — R7881 Bacteremia: Secondary | ICD-10-CM | POA: Diagnosis not present

## 2020-12-26 DIAGNOSIS — I499 Cardiac arrhythmia, unspecified: Secondary | ICD-10-CM

## 2020-12-26 DIAGNOSIS — B182 Chronic viral hepatitis C: Secondary | ICD-10-CM | POA: Diagnosis not present

## 2020-12-26 DIAGNOSIS — I351 Nonrheumatic aortic (valve) insufficiency: Secondary | ICD-10-CM

## 2020-12-26 DIAGNOSIS — I340 Nonrheumatic mitral (valve) insufficiency: Secondary | ICD-10-CM

## 2020-12-26 DIAGNOSIS — I5032 Chronic diastolic (congestive) heart failure: Secondary | ICD-10-CM

## 2020-12-26 LAB — MAGNESIUM: Magnesium: 2.2 mg/dL (ref 1.7–2.4)

## 2020-12-26 LAB — CBC WITH DIFFERENTIAL/PLATELET
Abs Immature Granulocytes: 0.32 10*3/uL — ABNORMAL HIGH (ref 0.00–0.07)
Basophils Absolute: 0.1 10*3/uL (ref 0.0–0.1)
Basophils Relative: 0 %
Eosinophils Absolute: 0.2 10*3/uL (ref 0.0–0.5)
Eosinophils Relative: 1 %
HCT: 32.3 % — ABNORMAL LOW (ref 39.0–52.0)
Hemoglobin: 9.7 g/dL — ABNORMAL LOW (ref 13.0–17.0)
Immature Granulocytes: 1 %
Lymphocytes Relative: 6 %
Lymphs Abs: 1.4 10*3/uL (ref 0.7–4.0)
MCH: 23.8 pg — ABNORMAL LOW (ref 26.0–34.0)
MCHC: 30 g/dL (ref 30.0–36.0)
MCV: 79.2 fL — ABNORMAL LOW (ref 80.0–100.0)
Monocytes Absolute: 2.6 10*3/uL — ABNORMAL HIGH (ref 0.1–1.0)
Monocytes Relative: 11 %
Neutro Abs: 19.2 10*3/uL — ABNORMAL HIGH (ref 1.7–7.7)
Neutrophils Relative %: 81 %
Platelets: 315 10*3/uL (ref 150–400)
RBC: 4.08 MIL/uL — ABNORMAL LOW (ref 4.22–5.81)
RDW: 20.5 % — ABNORMAL HIGH (ref 11.5–15.5)
WBC: 23.8 10*3/uL — ABNORMAL HIGH (ref 4.0–10.5)
nRBC: 0 % (ref 0.0–0.2)

## 2020-12-26 LAB — COMPREHENSIVE METABOLIC PANEL
ALT: 9 U/L (ref 0–44)
AST: 25 U/L (ref 15–41)
Albumin: 2.2 g/dL — ABNORMAL LOW (ref 3.5–5.0)
Alkaline Phosphatase: 76 U/L (ref 38–126)
Anion gap: 12 (ref 5–15)
BUN: 37 mg/dL — ABNORMAL HIGH (ref 6–20)
CO2: 26 mmol/L (ref 22–32)
Calcium: 9.2 mg/dL (ref 8.9–10.3)
Chloride: 95 mmol/L — ABNORMAL LOW (ref 98–111)
Creatinine, Ser: 5.95 mg/dL — ABNORMAL HIGH (ref 0.61–1.24)
GFR, Estimated: 11 mL/min — ABNORMAL LOW (ref >60)
Glucose, Bld: 107 mg/dL — ABNORMAL HIGH (ref 70–99)
Potassium: 3.4 mmol/L — ABNORMAL LOW (ref 3.5–5.1)
Sodium: 133 mmol/L — ABNORMAL LOW (ref 135–145)
Total Bilirubin: 1.1 mg/dL (ref 0.3–1.2)
Total Protein: 8.4 g/dL — ABNORMAL HIGH (ref 6.5–8.1)

## 2020-12-26 LAB — PROCALCITONIN: Procalcitonin: 28.5 ng/mL

## 2020-12-26 LAB — PHOSPHORUS: Phosphorus: 2.4 mg/dL — ABNORMAL LOW (ref 2.5–4.6)

## 2020-12-26 LAB — TROPONIN I (HIGH SENSITIVITY)
Troponin I (High Sensitivity): 613 ng/L (ref <18)
Troponin I (High Sensitivity): 774 ng/L (ref <18)

## 2020-12-26 LAB — MRSA PCR SCREENING: MRSA by PCR: NEGATIVE

## 2020-12-26 MED ORDER — ADENOSINE 6 MG/2ML IV SOLN: 30.0000 mg | Freq: Once | INTRAVENOUS | Status: DC

## 2020-12-26 MED ORDER — SODIUM CHLORIDE 0.9 % IV BOLUS
250.0000 mL | Freq: Once | INTRAVENOUS | Status: AC
  Administered 2020-12-26: 250 mL via INTRAVENOUS

## 2020-12-26 MED ORDER — DILTIAZEM LOAD VIA INFUSION
10.0000 mg | Freq: Once | INTRAVENOUS | Status: AC
  Administered 2020-12-26: 10 mg via INTRAVENOUS
  Filled 2020-12-26: qty 10

## 2020-12-26 MED ORDER — DILTIAZEM HCL 60 MG PO TABS
30.0000 mg | ORAL_TABLET | Freq: Four times a day (QID) | ORAL | Status: DC
  Administered 2020-12-26 – 2020-12-28 (×7): 30 mg via ORAL
  Filled 2020-12-26 (×7): qty 1

## 2020-12-26 MED ORDER — ADENOSINE 6 MG/2ML IV SOLN
6.0000 mg | Freq: Once | INTRAVENOUS | Status: AC
  Administered 2020-12-26: 6 mg via INTRAVENOUS
  Filled 2020-12-26: qty 2

## 2020-12-26 MED ORDER — DILTIAZEM HCL-DEXTROSE 125-5 MG/125ML-% IV SOLN (PREMIX)
5.0000 mg/h | INTRAVENOUS | Status: DC
Start: 2020-12-26 — End: 2020-12-26
  Administered 2020-12-26: 5 mg/h via INTRAVENOUS
  Filled 2020-12-26: qty 125

## 2020-12-26 MED ORDER — HYDRALAZINE HCL 20 MG/ML IJ SOLN
10.0000 mg | Freq: Once | INTRAMUSCULAR | Status: AC
  Administered 2020-12-26: 10 mg via INTRAVENOUS
  Filled 2020-12-26: qty 1

## 2020-12-26 NOTE — Plan of Care (Signed)

## 2020-12-26 NOTE — Consult Note (Signed)
Cardiology Consultation:  Patient ID: Patrick Anthony MRN: [id number]; DOB: 1971-01-05  Admit date: 12/22/2020 Date of Consult: 12/26/2020  Primary Care Provider: [name], MD Primary Cardiologist: Fransico Him, MD  Primary Electrophysiologist:  None  Patient Profile:  Patrick Anthony is a 50 y.o. 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  History of Present Illness:  Mr. Lefave was admitted in March 2022 with endocarditis and underwent mitral valve replacement.  He also underwent right AKA due to osteomyelitis of the right leg.  He had an extensive hospitalization there where he was found to have normal coronary arteries.  He was readmitted this admission on 12/22/2020 with fevers and recurrent bacteremia.  Cultures were again positive for MRSA.  He underwent transesophageal echocardiogram on 12/24/2020.  He was found to have mitral valve endocarditis.  Surgery has been consulted.  This morning on 12/26/2020 he developed tachycardia.  He has had sinus tachycardia since being here.  He was given adenosine by me 6 mg and this shows slowing with likely atrial flutter.  I suspect this is an atypical atrial flutter.  Labs are remarkable for potassium 3.4, magnesium 2.2.  WBC elevated 23.8.  Hemoglobin 9.7 and platelets 315.  Cardiac surgery consultation is pending.  Brain MRI is consistent with multiple punctate infarcts.  MRI of his lumbar spine shows no evidence of discitis.  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  This is alarming.  Neurology has not recommended any further work-up.  This is high risk for conversion to hemorrhagic transformation.  Heart Pathway Score:       Past Medical History: Past Medical History:   Diagnosis Date  . 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 Chronic kidney disease    patient on dialysis tues-thurs-sat  . Cirrhosis of liver ([place])   . Drug addiction ([place])   . ETOH abuse   . Hepatitis   . Hypertension   . Mitral regurgitation   . Patent foramen ovale 10/23/2019  . Renal disorder renal failure  . S/P minimally-invasive mitral valve replacement with bioprosthetic valve 10/14/2020   31 mm Medtronic Mosaic stented porcine bioprosthetic tissue valve via right mini thoracotomy approach  . Sleep apnea   . Systolic and diastolic CHF, 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[place]) 04/19/2013    Past Surgical History: Past Surgical History:  Procedure Laterality Date  . AMPUTATION Right 10/29/2020   Procedure: RIGHT ABOVE KNEE AMPUTATION;  Surgeon: [name], MD;  Location: [place];  Service: Orthopedics;  Laterality: Right;  . AV FISTULA PLACEMENT Right   . AV FISTULA PLACEMENT Left 09/03/2013   Procedure: ARTERIOVENOUS (AV) FISTULA CREATION - LEFT BRACHIOCEPHALIC;  Surgeon: Conrad Golden Gate, MD;  Location: [facility] OR;  Service: Vascular;  Laterality: Left;  . AV FISTULA PLACEMENT Left 10/30/2019   Procedure: INSERTION OF ARTERIOVENOUS (AV) GORE-TEX GRAFT ARM;  Surgeon: Rosetta Posner, MD;  Location: [place];  Service: Vascular;  Laterality: Left;  . BUBBLE STUDY  10/05/2020   Procedure: BUBBLE STUDY;  Surgeon: Pixie Casino, MD;  Location: [place];  Service: Cardiovascular;;  . I & D EXTREMITY Right 10/20/2020   Procedure: IRRIGATION AND  DEBRIDEMENT OF LEG;  Surgeon: [name], MD;  Location: [place];  Service: Orthopedics;  Laterality: Right;  . INSERTION OF DIALYSIS CATHETER N/A 08/29/2013   Procedure: INSERTION OF DIALYSIS CATHETER;  Surgeon: Mal Misty, MD;  Location: [place];  Service: Vascular;  Laterality: N/A;  . INSERTION OF DIALYSIS CATHETER Left 10/30/2019   Procedure: INSERTION OF DIALYSIS  CATHETER LEFT INTERNAL JUGULAR;  Surgeon: Rosetta Posner, MD;  Location: [place];  Service: Vascular;  Laterality: Left;  . LEFT HEART CATH AND CORONARY ANGIOGRAPHY N/A 10/07/2020   Procedure: LEFT HEART CATH AND CORONARY ANGIOGRAPHY;  Surgeon: Lorretta Harp, MD;  Location: [place] CV LAB;  Service: Cardiovascular;  Laterality: N/A;  . MITRAL VALVE REPAIR Right 10/14/2020   Procedure: MINIMALLY INVASIVE MITRAL VALVE  REPLACEMENT(MVR) USING MEDTRONIC MOSAIC VALVE SIZE 31MM;  Surgeon: Rexene Alberts, MD;  Location: [place];  Service: Open Heart Surgery;  Laterality: Right;  . MULTIPLE EXTRACTIONS WITH ALVEOLOPLASTY N/A 10/12/2020   Procedure: MULTIPLE EXTRACTION WITH ALVEOLOPLASTY;  Surgeon: [name], DMD;  Location: [place];  Service: Dentistry;  Laterality: N/A;  . PERIPHERAL VASCULAR CATHETERIZATION N/A 01/06/2015   Procedure: A/V Shuntogram/Fistulagram;  Surgeon: Katha Cabal, MD;  Location: [place] CV LAB;  Service: Cardiovascular;  Laterality: N/A;  . PERIPHERAL VASCULAR CATHETERIZATION Left 01/06/2015   Procedure: A/V Shunt Intervention;  Surgeon: Katha Cabal, MD;  Location: [place] CV LAB;  Service: Cardiovascular;  Laterality: Left;  . REPAIR OF PATENT FORAMEN OVALE N/A 10/14/2020   Procedure: CLOSURE OF PATENT FORAMEN OVALE;  Surgeon: Rexene Alberts, MD;  Location: [place];  Service: Open Heart Surgery;  Laterality: N/A;  . TEE WITHOUT CARDIOVERSION N/A 10/05/2020   Procedure: TRANSESOPHAGEAL ECHOCARDIOGRAM (TEE);  Surgeon: Pixie Casino, MD;  Location: [place];  Service: Cardiovascular;  Laterality: N/A;  . TEE WITHOUT CARDIOVERSION N/A 10/14/2020   Procedure: TRANSESOPHAGEAL ECHOCARDIOGRAM (TEE);  Surgeon: Rexene Alberts, MD;  Location: [place];  Service: Open Heart Surgery;  Laterality: N/A;     Home Medications:  Prior to Admission medications   Medication Sig Start Date End Date Taking? Authorizing Provider  acetaminophen (TYLENOL) 325 MG tablet Take  650 mg by mouth every 6 (six) hours as needed for mild pain. Temp > 100.5   Yes [provider]  aspirin EC 325 MG EC tablet Take 1 tablet (325 mg total) by mouth daily. 11/10/20  Yes Samella Parr, NP  bisacodyl (DULCOLAX) 10 MG suppository Place 1 suppository (10 mg total) rectally daily as needed for moderate constipation. 11/09/20  Yes Samella Parr, NP  calcium acetate (PHOSLO) 667 MG capsule Take 1 capsule (667 mg total) by mouth 3 (three) times daily with meals. 11/09/20  Yes Samella Parr, NP  cinacalcet (SENSIPAR) 30 MG tablet Take 2 tablets (60 mg total) by mouth daily with breakfast. 11/10/20  Yes Samella Parr, NP  cloNIDine (CATAPRES) 0.2 MG tablet Take 0.2 mg by mouth 2 (two) times daily.   Yes [provider]  docusate sodium (COLACE) 100 MG capsule Take 1 capsule (100 mg total) by mouth 2 (two) times daily. 11/09/20  Yes Samella Parr, NP  HYDROmorphone (DILAUDID) 2 MG tablet Take 1 tablet (2 mg total) by mouth every 4 (four) hours as needed for severe pain (Not controlled by oxycontin). 11/09/20  Yes Samella Parr, NP  methocarbamol (ROBAXIN) 500 MG tablet Take 1 tablet (500 mg total) by mouth 3 (three) times  daily. 11/09/20  Yes Samella Parr, NP  metoprolol tartrate (LOPRESSOR) 50 MG tablet Take 1 tablet (50 mg total) by mouth 2 (two) times daily. 11/09/20  Yes Samella Parr, NP  Multiple Vitamin (MULTIVITAMIN WITH MINERALS) TABS tablet Take 1 tablet by mouth daily.   Yes [provider]  nitroGLYCERIN (NITROSTAT) 0.4 MG SL tablet Place 0.4 mg under the tongue every 5 (five) minutes x 3 doses as needed for chest pain.    Yes [provider]  Nutritional Supplements (,FEEDING SUPPLEMENT, PROSOURCE PLUS) liquid Take 30 mLs by mouth 2 (two) times daily between meals. 11/09/20  Yes Samella Parr, NP  Nutritional Supplements (FEEDING SUPPLEMENT, NEPRO CARB STEADY,) LIQD Take 237 mLs by mouth 3 (three) times daily between meals. 11/09/20  Yes  Samella Parr, NP  oxyCODONE (OXYCONTIN) 15 mg 12 hr tablet Take 1 tablet (15 mg total) by mouth every 12 (twelve) hours. 11/09/20  Yes Samella Parr, NP  pantoprazole (PROTONIX) 40 MG tablet Take 1 tablet (40 mg total) by mouth daily. 11/10/20  Yes Samella Parr, NP  polyethylene glycol (MIRALAX / GLYCOLAX) 17 g packet Take 17 g by mouth daily as needed for mild constipation. 11/09/20  Yes Samella Parr, NP  pregabalin (LYRICA) 75 MG capsule Take 1 capsule (75 mg total) by mouth at bedtime. 11/09/20  Yes Samella Parr, NP  senna-docusate (SENOKOT-S) 8.6-50 MG tablet Take 1 tablet by mouth 2 (two) times daily. 11/09/20  Yes Samella Parr, NP  sevelamer carbonate (RENVELA) 800 MG tablet Take 1,600 mg by mouth 3 (three) times daily with meals.   Yes [provider]  vancomycin 750 mg in sodium chloride 0.9 % 150 mL Inject 750 mg into the vein every Monday, Wednesday, and Friday with hemodialysis.   Yes [provider]  Darbepoetin Alfa (ARANESP, ALBUMIN FREE, IJ) Darbepoetin Alfa (Aranesp) 07/05/20 07/04/21  [provider]    Inpatient Medications: Scheduled Meds: . (feeding supplement) PROSource Plus  30 mL Oral BID BM  . adenosine (ADENOCARD) IV  6 mg Intravenous Once  . Chlorhexidine Gluconate Cloth  6 each Topical Q0600  . Chlorhexidine Gluconate Cloth  6 each Topical Q0600  . cinacalcet  60 mg Oral Q supper  . docusate sodium  100 mg Oral BID  . feeding supplement (NEPRO CARB STEADY)  237 mL Oral TID BM  . methocarbamol  500 mg Oral TID  . multivitamin with minerals  1 tablet Oral Daily  . oxyCODONE  15 mg Oral Q12H  . pantoprazole  40 mg Oral Daily  . pregabalin  75 mg Oral QHS  . senna-docusate  1 tablet Oral BID  . sodium chloride flush  3 mL Intravenous Q12H   Continuous Infusions: . [START ON 12/27/2020] gentamicin    . vancomycin 750 mg (12/24/20 1657)   PRN Meds: acetaminophen **OR** acetaminophen, albuterol, bisacodyl, ondansetron **OR**  ondansetron (ZOFRAN) IV, polyethylene glycol  Allergies:    No Known Allergies  Social History:   Social History   Socioeconomic History  . Marital status: Single    Spouse name: Not on file  . Number of children: Not on file  . Years of education: Not on file  . Highest education level: Not on file  Occupational History  . Not on file  Tobacco Use  . Smoking status: Former Smoker    Packs/day: 0.20    Types: Cigarettes    Quit date: 10/05/2017    Years since quitting:  3.2  . Smokeless tobacco: Never Used  . Tobacco comment: Quit 3 years ago  Vaping Use  . Vaping Use: Never used  Substance and Sexual Activity  . Alcohol use: No  . Drug use: No    Comment: Quit  . Sexual activity: Not Currently    Birth control/protection: Condom  Other Topics Concern  . Not on file  Social History Narrative   ** Merged History Encounter **       Social Determinants of Health   Financial Resource Strain: Not on file  Food Insecurity: Not on file  Transportation Needs: Not on file  Physical Activity: Not on file  Stress: Not on file  Social Connections: Not on file  Intimate Partner Violence: Not on file     Family History:    Family History  Problem Relation Age of Onset  . Hypertension Mother   . Cancer Mother        breast     ROS:  All other ROS reviewed and negative. Pertinent positives noted in the HPI.     Physical Exam/Data:   Vitals:   12/26/20 0500 12/26/20 0609 12/26/20 0956 12/26/20 1023  BP:  (!) 156/106 (!) 172/88 (!) 174/93  Pulse:  (!) 103 (!) 141 (!) 140  Resp:  '15 15 20  '$ Temp:  98 F (36.7 C) (!) 102.8 F (39.3 C) 99.5 F (37.5 C)  TempSrc:  Oral Oral Oral  SpO2:  100% 92% 94%  Weight: 75.2 kg     Height:         Intake/Output Summary (Last 24 hours) at 12/26/2020 1105 Last data filed at 12/25/2020 2100 Gross per 24 hour  Intake 240 ml  Output --  Net 240 ml    Last 3 Weights 12/26/2020 12/25/2020 12/24/2020  Weight (lbs) 165 lb 12.6 oz  154 lb 1.6 oz 159 lb 13.3 oz  Weight (kg) 75.2 kg 69.9 kg 72.5 kg    Body mass index is 22.48 kg/m.  General: Well nourished, well developed, in no acute distress Head: Atraumatic, normal size  Eyes: PEERLA, EOMI  Neck: Supple, no JVD Endocrine: No thryomegaly Cardiac: Normal S1, S2; RRR; 2 out of 6 holosystolic murmur Lungs: Clear to auscultation bilaterally, no wheezing, rhonchi or rales  Abd: Soft, nontender, no hepatomegaly  Ext: No edema, pulses 2+ Musculoskeletal: No deformities, status post left AKA Skin: Warm and dry, no rashes   Neuro: Alert and oriented to person, place, time, and situation, CNII-XII grossly intact, no focal deficits  Psych: Normal mood and affect   EKG:  The EKG was personally reviewed and demonstrates: SVT heart rate 145, likely atrial flutter Telemetry:  Telemetry was personally reviewed and demonstrates: Atrial flutter heart rate in the 140s  Relevant CV Studies:  TEE 12/24/2020 1. Left ventricular ejection fraction, by estimation, is 60 to 65%. The  left ventricle has normal function. The left ventricle has no regional  wall motion abnormalities. There is moderate left ventricular hypertrophy.  2. Right ventricular systolic function is normal. The right ventricular  size is severely enlarged. Mildly increased right ventricular wall  thickness.  3. No left atrial/left atrial appendage thrombus was detected.  4. Right atrial size was severely dilated.  5. The mitral valve has been repaired/replaced. Trivial mitral valve  regurgitation. There is a bioprosthetic valve present in the mitral  position. Echo findings are consistent with vegetation the mitral  prosthesis.  6. Aortic valve vegetation is visualized on the left.  7. The  aortic valve is tricuspid. Aortic valve regurgitation is not  visualized.  8. There is mild (Grade II) plaque.  LHC 10/07/2020  Right atrial pressure-15/8, mean 7 Right ventricular pressure-43/3 Pulmonary artery  pressure-37/8, mean 23 Pulmonary artery wedge pressure-A-wave 19, V wave 19, mean 14 LVEDP-22 Cardiac output-15.4 L/min with an index of 7.76 L/min/m.    IMPRESSION: Mr. Schillo has clean coronary arteries, mildly elevated LVEDP and no evidence of a large V wave.  If he needs mitral valve replacement/repair his coronary anatomy is clean.  I did not place closure devices since the patient has potential SBE and sepsis I do not want to leave a foreign body in the patient's artery or vein.  The sheath will be removed and pressure held.  The patient left lab in stable condition.     Laboratory Data: High Sensitivity Troponin:  No results for input(s): TROPONINIHS in the last 720 hours.   Cardiac EnzymesNo results for input(s): TROPONINI in the last 168 hours. No results for input(s): TROPIPOC in the last 168 hours.  Chemistry Recent Labs  Lab 12/24/20 0347 12/25/20 0056 12/26/20 0117  NA 133*  133* 137 133*  K 3.9  3.9 3.3* 3.4*  CL 92*  92* 98 95*  CO2 '27  27 24 26  '$ GLUCOSE 105*  106* 90 107*  BUN 43*  42* 26* 37*  CREATININE 6.91*  6.83* 4.78* 5.95*  CALCIUM 9.1  9.2 8.7* 9.2  GFRNONAA 9*  9* 14* 11*  ANIONGAP '14  14 15 12    '$ Recent Labs  Lab 12/24/20 0347 12/25/20 0056 12/26/20 0117  PROT 8.6* 8.5* 8.4*  ALBUMIN 2.2*  2.2* 2.2* 2.2*  AST '21 22 25  '$ ALT '10 10 9  '$ ALKPHOS 79 81 76  BILITOT 1.0 1.2 1.1   Hematology Recent Labs  Lab 12/24/20 0347 12/25/20 0056 12/26/20 0117  WBC 21.1* 28.9* 23.8*  RBC 3.96* 4.07* 4.08*  HGB 9.7* 10.0* 9.7*  HCT 32.0* 32.8* 32.3*  MCV 80.8 80.6 79.2*  MCH 24.5* 24.6* 23.8*  MCHC 30.3 30.5 30.0  RDW 20.3* 20.6* 20.5*  PLT 230 262 315   BNPNo results for input(s): BNP, PROBNP in the last 168 hours.  DDimer No results for input(s): DDIMER in the last 168 hours.  Radiology/Studies:  MR BRAIN WO CONTRAST  Addendum Date: 12/25/2020   ADDENDUM REPORT: 12/25/2020 03:54 ADDENDUM: The pattern is most consistent with an  embolic process. Electronically Signed   By: Ulyses Jarred M.D.   On: 12/25/2020 03:54   Result Date: 12/25/2020 CLINICAL DATA:  [place] EXAM: MRI HEAD WITHOUT CONTRAST TECHNIQUE: Multiplanar, multiecho pulse sequences of the brain and surrounding structures were obtained without intravenous contrast. COMPARISON:  09/08/2020 FINDINGS: Brain: Numerous punctate foci of abnormal diffusion restriction within both hemispheres in multiple vascular territories. Multiple scattered microhemorrhages in a nonspecific pattern. There is multifocal hyperintense T2-weighted signal within the white matter. Parenchymal volume and CSF spaces are normal. The midline structures are normal. Vascular: Major flow voids are preserved. Skull and upper cervical spine: Normal calvarium and skull base. Visualized upper cervical spine and soft tissues are normal. Sinuses/Orbits:No paranasal sinus fluid levels or advanced mucosal thickening. No mastoid or middle ear effusion. Normal orbits. IMPRESSION: 1. Numerous punctate foci of abnormal diffusion restriction within both hemispheres in multiple vascular territories, consistent with acute infarcts. 2. Multiple scattered microhemorrhages in a nonspecific pattern. Electronically Signed: By: Ulyses Jarred M.D. On: 12/25/2020 03:24   MR THORACIC SPINE WO CONTRAST  Result Date:  12/25/2020 CLINICAL DATA:  Back pain.  Leukocytosis.  MRSA bacteremia EXAM: MRI THORACIC SPINE WITHOUT CONTRAST TECHNIQUE: Multiplanar, multisequence MR imaging of the thoracic spine was performed. No intravenous contrast was administered. COMPARISON:  MRI 09/29/2020 FINDINGS: Alignment:  Physiologic. Vertebrae: There is fluid signal present within the T10-11 intervertebral disc space with bone marrow edema at the superior endplate of 624THL. More subtle changes along the inferior endplate of QA348G. Possible subtle central endplate erosion at 624THL (series 6, image 8). There is mild prevertebral soft tissue edema anterior to  the T10 and T11 vertebral bodies (series 6, images 7-9). There is a similar degree of endplate marrow edema centered at the C6-7 intervertebral disc space, likely representing degenerative discogenic changes. No acute fracture. No suspicious bone lesion. Diffusely decreased T1 marrow signal throughout the visualized bone marrow, which may be related to chronic anemia. Cord: Normal signal and morphology. No evidence of epidural fluid collection. Paraspinal and other soft tissues: Small bilateral pleural effusions. No soft tissue fluid collection or abscess. Disc levels: Mild right greater than left foraminal stenosis at the T10-11 level. No canal stenosis at any level within the thoracic spine. IMPRESSION: 1. 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 Mild prevertebral soft tissue edema at this level. No evidence of an epidural fluid collection or abscess. 2. Moderate endplate marrow edema centered at the C6-7 intervertebral disc space is similar to the previous MRI, likely representing degenerative discogenic changes. 3. Small bilateral pleural effusions. These results will be called to the ordering clinician or representative by the Radiologist Assistant, and communication documented in the PACS or [employer]. Electronically Signed   By: Davina Poke D.O.   On: 12/25/2020 17:51   MR Lumbar Spine W Wo Contrast  Result Date: 12/22/2020 CLINICAL DATA:  Low back pain, possible infection EXAM: MRI LUMBAR SPINE WITHOUT AND WITH CONTRAST TECHNIQUE: Multiplanar and multiecho pulse sequences of the lumbar spine were obtained without and with intravenous contrast. CONTRAST:  7.66m GADAVIST GADOBUTROL 1 MMOL/ML IV SOLN COMPARISON:  Lumbar MRI 09/30/2020 FINDINGS: Despite efforts by the technologist and patient, motion artifact is present on today's exam and could not be eliminated. This reduces exam sensitivity and specificity. Segmentation: The lowest lumbar type non-rib-bearing  vertebra is labeled as L5. Alignment:  Unremarkable Vertebrae: Type 2 degenerative endplate findings at L075-GRMwith endplate irregularities, disc desiccation, and loss of disc height. This is mostly similar to the 09/30/2020 exam. There is also disc desiccation at L4-5 similar to prior. No significant abnormal degree of bony enhancement. Conus medullaris and cauda equina: Conus extends to the L1 level. Conus and cauda equina appear normal. Paraspinal and other soft tissues: Innumerable cysts of varying size and complexity scattered in both kidneys compatible with polycystic kidney disease. The kidneys are indistinct due to motion in today's exam was not optimized to assess the kidneys. Small retroperitoneal lymph nodes are not pathologically enlarged by size criteria. Disc levels: L1-2: Unremarkable. L2-3: Unremarkable. L3-4: Unremarkable. L4-5: Mild bilateral subarticular lateral recess stenosis due to disc bulge, minimally worsened from prior. L5-S1: Mild bilateral subarticular lateral recess stenosis and borderline bilateral foraminal stenosis due to intervertebral spurring, disc bulge, and facet arthropathy, similar to prior. IMPRESSION: 1. No findings of discitis or osteomyelitis. No compelling findings of epidural or psoas abscess. 2. Lumbar spondylosis and degenerative disc disease with mild impingement at L4-5 and L5-S1. Degenerative endplate findings particularly at L5-S1. 3. Autosomal dominant polycystic kidney disease. Electronically Signed   By: WCindra EvesD.  On: 12/22/2020 12:19   ECHO TEE  Result Date: 12/24/2020    TRANSESOPHOGEAL ECHO REPORT   Patient Name:   Patrick Anthony Date of Exam: 12/24/2020 Medical Rec #:  [id number]      Height:       72.0 in Accession #:    [id number]     Weight:       159.8 lb Date of Birth:  1970/09/12      BSA:          1.937 m Patient Age:    40 years       BP:           155/91 mmHg Patient Gender: M              HR:           95 bpm. Exam Location:   Inpatient Procedure: 2D Echo, Cardiac Doppler, Color Doppler and Transesophageal Echo Indications:     Bacteremia  History:         Patient has prior history of Echocardiogram examinations, most                  recent 12/22/2020.                   Mitral Valve: bioprosthetic valve valve is present in the                  mitral position.  Sonographer:     Merrie Roof RDCS Referring Phys:  [id number] [place] [facility] Diagnosing Phys: Lyman Bishop MD PROCEDURE: After discussion of the risks and benefits of a TEE, an informed consent was obtained from the patient. The transesophogeal probe was passed without difficulty through the esophogus of the patient. Local oropharyngeal anesthetic was provided with Cetacaine. Sedation performed by different physician. The patient was monitored while under deep sedation. The patient's vital signs; including heart rate, blood pressure, and oxygen saturation; remained stable throughout the procedure. The patient developed no complications during the procedure. IMPRESSIONS  1. Left ventricular ejection fraction, by estimation, is 60 to 65%. The left ventricle has normal function. The left ventricle has no regional wall motion abnormalities. There is moderate left ventricular hypertrophy.  2. Right ventricular systolic function is normal. The right ventricular size is severely enlarged. Mildly increased right ventricular wall thickness.  3. No left atrial/left atrial appendage thrombus was detected.  4. Right atrial size was severely dilated.  5. The mitral valve has been repaired/replaced. Trivial mitral valve regurgitation. There is a bioprosthetic valve present in the mitral position. Echo findings are consistent with vegetation the mitral prosthesis.  6. Aortic valve vegetation is visualized on the left.  7. The aortic valve is tricuspid. Aortic valve regurgitation is not visualized.  8. There is mild (Grade II) plaque. Conclusion(s)/Recommendation(s): Findings are concerning for  vegetation/infective endocarditis as detailed above. FINDINGS  Left Ventricle: Left ventricular ejection fraction, by estimation, is 60 to 65%. The left ventricle has normal function. The left ventricle has no regional wall motion abnormalities. The left ventricular internal cavity size was normal in size. There is  moderate left ventricular hypertrophy. Right Ventricle: The right ventricular size is severely enlarged. Mildly increased right ventricular wall thickness. Right ventricular systolic function is normal. Left Atrium: Left atrial size was normal in size. No left atrial/left atrial appendage thrombus was detected. Right Atrium: Right atrial size was severely dilated. Pericardium: There is no evidence of pericardial effusion. Mitral Valve: The mitral valve has been repaired/replaced.  Trivial mitral valve regurgitation. There is a bioprosthetic valve present in the mitral position. Echo findings are consistent with vegetation on the mitral prosthesis. Tricuspid Valve: The tricuspid valve is grossly normal. Tricuspid valve regurgitation is mild. Aortic Valve: The aortic valve is tricuspid. Aortic valve regurgitation is not visualized. A mobile vegetation is seen on the left. The AoV vegetation measures 5 mm x 5 mm. Pulmonic Valve: The pulmonic valve was grossly normal. Pulmonic valve regurgitation is trivial. Aorta: The aortic root and ascending aorta are structurally normal, with no evidence of dilitation. There is mild (Grade II) plaque. IAS/Shunts: No atrial level shunt detected by color flow Doppler. Lyman Bishop MD Electronically signed by Lyman Bishop MD Signature Date/Time: 12/24/2020/3:38:39 PM    Final     Assessment and Plan:   1.  SVT, possibly atypical atrial flutter -He was given adenosine 6 mg.  Heart rate did slow.  To me this looks like 2-1 flutter.  It is clearly different than his sinus tachycardia. -Unfortunately he did not break with this.  We will start him on diltiazem.  We could  consider flecainide as a one-time dose. -I have instructed nursing to give him 10 mg IV bolus and then start him on a diltiazem drip. -Unfortunately anticoagulation is problematic.  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  I would recommend against anticoagulation at this time.  This would be high risk for hemorrhagic conversion.  2.  Bioprosthetic mitral valve endocarditis -Normal coronaries.  Unfortunately I do not think you will be candidate for redo mitral valve surgery. -Would continue antibiotics. -CT surgery has been consulted.  For questions or updates, please contact [place] Please consult [email or web address] for contact info under   Signed, [place] B[place] T. Audie Box, MD, [place]  12/26/2020 11:05 AM

## 2020-12-26 NOTE — Progress Notes (Signed)
Reviewed telemetry.  Heart rate down in the low 100s on diltiazem drip.  His heart rate has come down slowly.  On further review this may just represent sinus tachycardia.  There were no clear flutter waves.  We will transition him to oral diltiazem.  Discussed with nursing.  Vital signs stable.  [place] Bells T. Audie Box, MD, [place]  [address] [place], [place] [address] [phone]  4:21 PM

## 2020-12-26 NOTE — Consult Note (Signed)
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 Referring Physician: Dr. Claybon Jabs  Patrick Anthony is an 50 y.o. male.  HPI:   The patient is a 50 year old gentleman with a history of end-stage renal disease on hemodialysis, hyperlipidemia, cirrhosis of the liver secondary to hepatitis C, history of polysubstance abuse, OSA, and chronic diastolic congestive heart failure who underwent minimally invasive mitral valve replacement by Dr. 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  He also had a cavitary lesion in the left upper lobe of the lung.  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 Sharol Given.  He was eventually discharged to skilled nursing for continuation of his vancomycin which was completed on 11/25/2020.  The patient has apparently been going from the skilled nursing facility to dialysis and apparently when he went to dialysis he had altered mental status and was complaining of severe pain in his lower back.  He was brought by EMS to the hospital and was noted to have a temperature of 103.  He was tachycardic and tachypneic with a white blood cell count of 20.2 and lactic acid of 1.7.  Albumin was 2.4.  Blood cultures drawn on 12/22/2020 showed MRSA. TEE on 12/24/2020 showed vegetation on the mitral valve prosthesis with trivial MR.  There also appeared to be vegetation on the aortic valve with no aortic insufficiency.  Left ventricular ejection fraction was 60 to 65%.   Past Medical History:  Diagnosis Date  . 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 09/29/2020   Patient needs f/u  CT chest 3 months  . Chronic kidney disease    patient on dialysis tues-thurs-sat  . Cirrhosis of liver ([place])   . Drug addiction ([place])   . ETOH abuse   . Hepatitis   . Hypertension   . Mitral regurgitation   . Patent foramen ovale 10/23/2019  . Renal disorder renal failure  . S/P minimally-invasive mitral valve replacement with bioprosthetic valve 10/14/2020   31 mm Medtronic Mosaic stented porcine bioprosthetic tissue valve via right mini thoracotomy approach  . Sleep apnea   . Systolic and diastolic CHF, 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[place]) 04/19/2013    Past Surgical History:  Procedure Laterality Date  . AMPUTATION Right 10/29/2020   Procedure: RIGHT ABOVE KNEE AMPUTATION;  Surgeon: [name], MD;  Location: [facility];  Service: Orthopedics;  Laterality: Right;  . AV FISTULA PLACEMENT Right   . AV FISTULA PLACEMENT Left 09/03/2013   Procedure: ARTERIOVENOUS (AV) FISTULA CREATION - LEFT BRACHIOCEPHALIC;  Surgeon: Conrad Mount Hope, MD;  Location: [facility] OR;  Service: Vascular;  Laterality: Left;  . AV FISTULA PLACEMENT Left 10/30/2019   Procedure: INSERTION OF ARTERIOVENOUS (AV) GORE-TEX GRAFT ARM;  Surgeon: Rosetta Posner, MD;  Location: [facility];  Service: Vascular;  Laterality: Left;  . BUBBLE STUDY  10/05/2020   Procedure: BUBBLE STUDY;  Surgeon: Pixie Casino, MD;  Location: [facility];  Service: Cardiovascular;;  . I & D EXTREMITY Right 10/20/2020   Procedure: IRRIGATION AND DEBRIDEMENT  OF LEG;  Surgeon: [name], MD;  Location: [facility];  Service: Orthopedics;  Laterality: Right;  . INSERTION OF DIALYSIS CATHETER N/A 08/29/2013   Procedure: INSERTION OF DIALYSIS CATHETER;  Surgeon: Mal Misty, MD;  Location: [facility];  Service: Vascular;  Laterality: N/A;  . INSERTION OF DIALYSIS CATHETER Left 10/30/2019   Procedure: INSERTION OF DIALYSIS CATHETER LEFT INTERNAL JUGULAR;  Surgeon: Rosetta Posner, MD;  Location: [facility];  Service: Vascular;  Laterality: Left;  . LEFT HEART CATH AND CORONARY ANGIOGRAPHY  N/A 10/07/2020   Procedure: LEFT HEART CATH AND CORONARY ANGIOGRAPHY;  Surgeon: Lorretta Harp, MD;  Location: [facility];  Service: Cardiovascular;  Laterality: N/A;  . MITRAL VALVE REPAIR Right 10/14/2020   Procedure: MINIMALLY INVASIVE MITRAL VALVE  REPLACEMENT(MVR) USING MEDTRONIC MOSAIC VALVE SIZE 31MM;  Surgeon: Rexene Alberts, MD;  Location: [facility];  Service: Open Heart Surgery;  Laterality: Right;  . MULTIPLE EXTRACTIONS WITH ALVEOLOPLASTY N/A 10/12/2020   Procedure: MULTIPLE EXTRACTION WITH ALVEOLOPLASTY;  Surgeon: [name], DMD;  Location: [facility];  Service: Dentistry;  Laterality: N/A;  . PERIPHERAL VASCULAR CATHETERIZATION N/A 01/06/2015   Procedure: A/V Shuntogram/Fistulagram;  Surgeon: Katha Cabal, MD;  Location: [facility];  Service: Cardiovascular;  Laterality: N/A;  . PERIPHERAL VASCULAR CATHETERIZATION Left 01/06/2015   Procedure: A/V Shunt Intervention;  Surgeon: Katha Cabal, MD;  Location: [facility];  Service: Cardiovascular;  Laterality: Left;  . REPAIR OF PATENT FORAMEN OVALE N/A 10/14/2020   Procedure: CLOSURE OF PATENT FORAMEN OVALE;  Surgeon: Rexene Alberts, MD;  Location: [facility];  Service: Open Heart Surgery;  Laterality: N/A;  . TEE WITHOUT CARDIOVERSION N/A 10/05/2020   Procedure: TRANSESOPHAGEAL ECHOCARDIOGRAM (TEE);  Surgeon: Pixie Casino, MD;  Location: [facility];  Service: Cardiovascular;  Laterality: N/A;  . TEE WITHOUT CARDIOVERSION N/A 10/14/2020   Procedure: TRANSESOPHAGEAL ECHOCARDIOGRAM (TEE);  Surgeon: Rexene Alberts, MD;  Location: [facility];  Service: Open Heart Surgery;  Laterality: N/A;    Family History  Problem Relation Age of Onset  . Hypertension Mother   . Cancer Mother        breast    Social History:  reports that he quit smoking about 3 years ago. His smoking use included cigarettes. He smoked 0.20 packs per day. He has never used smokeless tobacco. He reports that he does not drink alcohol and  does not use drugs.  Allergies: No Known Allergies  Medications:  I have reviewed the patient's current medications. Prior to Admission:  Medications Prior to Admission  Medication Sig Dispense Refill Last Dose  . acetaminophen (TYLENOL) 325 MG tablet Take 650 mg by mouth every 6 (six) hours as needed for mild pain. Temp > 100.5     . aspirin EC 325 MG EC tablet Take 1 tablet (325 mg total) by mouth daily. 30 tablet 0 12/21/2020 at Unknown time  . bisacodyl (DULCOLAX) 10 MG suppository Place 1 suppository (10 mg total) rectally daily as needed for moderate constipation. 12 suppository 0 Past Month at Unknown time  . calcium acetate (PHOSLO) 667 MG capsule Take 1 capsule (667 mg total) by mouth 3 (three) times daily with meals.   12/21/2020 at Unknown time  . cinacalcet (SENSIPAR) 30 MG tablet Take 2 tablets (60 mg total) by mouth daily with breakfast. 60 tablet  12/21/2020 at Unknown time  . cloNIDine (CATAPRES) 0.2 MG tablet Take 0.2 mg by mouth 2 (two) times daily.  12/21/2020 at Unknown time  . docusate sodium (COLACE) 100 MG capsule Take 1 capsule (100 mg total) by mouth 2 (two) times daily. 10 capsule 0 12/21/2020 at Unknown time  . HYDROmorphone (DILAUDID) 2 MG tablet Take 1 tablet (2 mg total) by mouth every 4 (four) hours as needed for severe pain (Not controlled by oxycontin). 30 tablet 0 12/21/2020 at Unknown time  . methocarbamol (ROBAXIN) 500 MG tablet Take 1 tablet (500 mg total) by mouth 3 (three) times daily.   12/21/2020 at Unknown time  . metoprolol tartrate (LOPRESSOR) 50 MG tablet Take 1 tablet (50 mg total) by mouth 2 (two) times daily.   12/21/2020 at 2100  . Multiple Vitamin (MULTIVITAMIN WITH MINERALS) TABS tablet Take 1 tablet by mouth daily.   12/21/2020 at Unknown time  . nitroGLYCERIN (NITROSTAT) 0.4 MG SL tablet Place 0.4 mg under the tongue every 5 (five) minutes x 3 doses as needed for chest pain.    unknown  . Nutritional Supplements (,FEEDING SUPPLEMENT, PROSOURCE PLUS)  liquid Take 30 mLs by mouth 2 (two) times daily between meals.   12/21/2020 at Unknown time  . Nutritional Supplements (FEEDING SUPPLEMENT, NEPRO CARB STEADY,) LIQD Take 237 mLs by mouth 3 (three) times daily between meals.  0 12/21/2020 at Unknown time  . oxyCODONE (OXYCONTIN) 15 mg 12 hr tablet Take 1 tablet (15 mg total) by mouth every 12 (twelve) hours. 14 tablet 0 12/21/2020 at Unknown time  . pantoprazole (PROTONIX) 40 MG tablet Take 1 tablet (40 mg total) by mouth daily.   12/21/2020 at Unknown time  . polyethylene glycol (MIRALAX / GLYCOLAX) 17 g packet Take 17 g by mouth daily as needed for mild constipation. 14 each 0 12/11/2020  . pregabalin (LYRICA) 75 MG capsule Take 1 capsule (75 mg total) by mouth at bedtime. 60 capsule 0 12/21/2020 at Unknown time  . senna-docusate (SENOKOT-S) 8.6-50 MG tablet Take 1 tablet by mouth 2 (two) times daily.   12/21/2020 at Unknown time  . sevelamer carbonate (RENVELA) 800 MG tablet Take 1,600 mg by mouth 3 (three) times daily with meals.   12/21/2020 at Unknown time  . vancomycin 750 mg in sodium chloride 0.9 % 150 mL Inject 750 mg into the vein every Monday, Wednesday, and Friday with hemodialysis.   12/20/2020  . Darbepoetin Alfa (ARANESP, ALBUMIN FREE, IJ) Darbepoetin Alfa (Aranesp)      Scheduled: . (feeding supplement) PROSource Plus  30 mL Oral BID BM  . Chlorhexidine Gluconate Cloth  6 each Topical Q0600  . Chlorhexidine Gluconate Cloth  6 each Topical Q0600  . cinacalcet  60 mg Oral Q supper  . docusate sodium  100 mg Oral BID  . feeding supplement (NEPRO CARB STEADY)  237 mL Oral TID BM  . methocarbamol  500 mg Oral TID  . multivitamin with minerals  1 tablet Oral Daily  . oxyCODONE  15 mg Oral Q12H  . pantoprazole  40 mg Oral Daily  . pregabalin  75 mg Oral QHS  . senna-docusate  1 tablet Oral BID  . sodium chloride flush  3 mL Intravenous Q12H   Continuous: . diltiazem (CARDIZEM) infusion 10 mg/hr (12/26/20 1300)  . [START ON 12/27/2020]  gentamicin    . vancomycin 750 mg (12/24/20 1657)   [id number] **OR** acetaminophen, albuterol, bisacodyl, ondansetron **OR** ondansetron (ZOFRAN) IV, polyethylene glycol  Results for orders placed or performed during the hospital encounter of 12/22/20 (from the past 48 hour(s))  Culture, blood (routine x 2)  Status: None (Preliminary result)   Collection Time: 12/24/20  3:10 PM   Specimen: BLOOD RIGHT HAND  Result Value Ref Range   Specimen Description BLOOD RIGHT HAND    Special Requests      BOTTLES DRAWN AEROBIC ONLY Blood Culture adequate volume   Culture      NO GROWTH 2 DAYS Performed at [facility], [place] [address], [place], [place] [address]    Report Status PENDING   Culture, blood (routine x 2)     Status: None (Preliminary result)   Collection Time: 12/24/20  3:37 PM   Specimen: BLOOD  Result Value Ref Range   Specimen Description BLOOD BLOOD RIGHT FOREARM    Special Requests      BOTTLES DRAWN AEROBIC ONLY Blood Culture adequate volume   Culture      NO GROWTH 2 DAYS Performed at [facility], [place] [address], [place], [place] [address]    Report Status PENDING   CBC with Differential/Platelet     Status: Abnormal   Collection Time: 12/25/20 12:56 AM  Result Value Ref Range   WBC 28.9 (H) 4.0 - 10.5 K/uL   RBC 4.07 (L) 4.22 - 5.81 MIL/uL   Hemoglobin 10.0 (L) 13.0 - 17.0 g/dL   HCT 32.8 (L) 39.0 - 52.0 %   MCV 80.6 80.0 - 100.0 fL   MCH 24.6 (L) 26.0 - 34.0 pg   MCHC 30.5 30.0 - 36.0 g/dL   RDW 20.6 (H) 11.5 - 15.5 %   Platelets 262 150 - 400 K/uL   nRBC 0.0 0.0 - 0.2 %   Neutrophils Relative % 83 %   Neutro Abs 23.7 (H) 1.7 - 7.7 K/uL   Lymphocytes Relative 5 %   Lymphs Abs 1.5 0.7 - 4.0 K/uL   Monocytes Relative 11 %   Monocytes Absolute 3.2 (H) 0.1 - 1.0 K/uL   Eosinophils Relative 0 %   Eosinophils Absolute 0.1 0.0 - 0.5 K/uL   Basophils Relative 0 %   Basophils Absolute 0.1 0.0 - 0.1 K/uL   Immature Granulocytes 1 %   Abs  Immature Granulocytes 0.39 (H) 0.00 - 0.07 K/uL    Comment: Performed at [facility] [address], [place], [place] [address]  Comprehensive metabolic panel     Status: Abnormal   Collection Time: 12/25/20 12:56 AM  Result Value Ref Range   Sodium 137 135 - 145 mmol/L   Potassium 3.3 (L) 3.5 - 5.1 mmol/L   Chloride 98 98 - 111 mmol/L   CO2 24 22 - 32 mmol/L   Glucose, Bld 90 70 - 99 mg/dL    Comment: Glucose reference range applies only to samples taken after fasting for at least 8 hours.   BUN 26 (H) 6 - 20 mg/dL   Creatinine, Ser 4.78 (H) 0.61 - 1.24 mg/dL   Calcium 8.7 (L) 8.9 - 10.3 mg/dL   Total Protein 8.5 (H) 6.5 - 8.1 g/dL   Albumin 2.2 (L) 3.5 - 5.0 g/dL   AST 22 15 - 41 U/L   ALT 10 0 - 44 U/L   Alkaline Phosphatase 81 38 - 126 U/L   Total Bilirubin 1.2 0.3 - 1.2 mg/dL   GFR, Estimated 14 (L) >60 mL/min    Comment: (NOTE) Calculated using the CKD-EPI Creatinine Equation (2021)    Anion gap 15 5 - 15    Comment: Performed at [facility], [place] [address], [place], [place] [address]  Magnesium     Status: None   Collection Time: 12/25/20 12:56 AM  Result Value Ref Range   Magnesium 2.0 1.7 - 2.4 mg/dL    Comment: Performed at [facility] [address] [place], [place] [address]  Phosphorus     Status: Abnormal   Collection Time: 12/25/20 12:56 AM  Result Value Ref Range   Phosphorus 1.5 (L) 2.5 - 4.6 mg/dL    Comment: Performed at [facility] [address], [place], [place] [address]  MRSA PCR Screening     Status: None   Collection Time: 12/25/20 10:18 PM   Specimen: Nasal Mucosa; Nasopharyngeal  Result Value Ref Range   MRSA by PCR NEGATIVE NEGATIVE    Comment:        The GeneXpert MRSA Assay (FDA approved for NASAL specimens only), is one component of a comprehensive MRSA colonization surveillance program. It is not intended to diagnose MRSA infection nor to guide or monitor treatment for MRSA infections. Performed at  [facility], [place] [address], [place], [place] [address]   CBC with Differential/Platelet     Status: Abnormal   Collection Time: 12/26/20  1:17 AM  Result Value Ref Range   WBC 23.8 (H) 4.0 - 10.5 K/uL   RBC 4.08 (L) 4.22 - 5.81 MIL/uL   Hemoglobin 9.7 (L) 13.0 - 17.0 g/dL   HCT 32.3 (L) 39.0 - 52.0 %   MCV 79.2 (L) 80.0 - 100.0 fL   MCH 23.8 (L) 26.0 - 34.0 pg   MCHC 30.0 30.0 - 36.0 g/dL   RDW 20.5 (H) 11.5 - 15.5 %   Platelets 315 150 - 400 K/uL   nRBC 0.0 0.0 - 0.2 %   Neutrophils Relative % 81 %   Neutro Abs 19.2 (H) 1.7 - 7.7 K/uL   Lymphocytes Relative 6 %   Lymphs Abs 1.4 0.7 - 4.0 K/uL   Monocytes Relative 11 %   Monocytes Absolute 2.6 (H) 0.1 - 1.0 K/uL   Eosinophils Relative 1 %   Eosinophils Absolute 0.2 0.0 - 0.5 K/uL   Basophils Relative 0 %   Basophils Absolute 0.1 0.0 - 0.1 K/uL   Immature Granulocytes 1 %   Abs Immature Granulocytes 0.32 (H) 0.00 - 0.07 K/uL    Comment: Performed at [facility] [address] [place], [place] [address]  Comprehensive metabolic panel     Status: Abnormal   Collection Time: 12/26/20  1:17 AM  Result Value Ref Range   Sodium 133 (L) 135 - 145 mmol/L   Potassium 3.4 (L) 3.5 - 5.1 mmol/L   Chloride 95 (L) 98 - 111 mmol/L   CO2 26 22 - 32 mmol/L   Glucose, Bld 107 (H) 70 - 99 mg/dL    Comment: Glucose reference range applies only to samples taken after fasting for at least 8 hours.   BUN 37 (H) 6 - 20 mg/dL   Creatinine, Ser 5.95 (H) 0.61 - 1.24 mg/dL   Calcium 9.2 8.9 - 10.3 mg/dL   Total Protein 8.4 (H) 6.5 - 8.1 g/dL   Albumin 2.2 (L) 3.5 - 5.0 g/dL   AST 25 15 - 41 U/L   ALT 9 0 - 44 U/L   Alkaline Phosphatase 76 38 - 126 U/L   Total Bilirubin 1.1 0.3 - 1.2 mg/dL   GFR, Estimated 11 (L) >60 mL/min    Comment: (NOTE) Calculated using the CKD-EPI Creatinine Equation (2021)  Anion gap 12 5 - 15    Comment: Performed at [facility] [address] [place], [place] [address]  Phosphorus      Status: Abnormal   Collection Time: 12/26/20  1:17 AM  Result Value Ref Range   Phosphorus 2.4 (L) 2.5 - 4.6 mg/dL    Comment: Performed at [facility] [address], [place], [place] [address]  Magnesium     Status: None   Collection Time: 12/26/20  1:17 AM  Result Value Ref Range   Magnesium 2.2 1.7 - 2.4 mg/dL    Comment: Performed at [facility] [address], [place], [place] [address]  Procalcitonin - Baseline     Status: None   Collection Time: 12/26/20 10:19 AM  Result Value Ref Range   Procalcitonin 28.50 ng/mL    Comment:        Interpretation: PCT >= 10 ng/mL: Important systemic inflammatory response, almost exclusively due to severe bacterial sepsis or septic shock. (NOTE)       Sepsis PCT Algorithm           Lower Respiratory Tract                                      Infection PCT Algorithm    ----------------------------     ----------------------------         PCT < 0.25 ng/mL                PCT < 0.10 ng/mL          Strongly encourage             Strongly discourage   discontinuation of antibiotics    initiation of antibiotics    ----------------------------     -----------------------------       PCT 0.25 - 0.50 ng/mL            PCT 0.10 - 0.25 ng/mL               OR       >80% decrease in PCT            Discourage initiation of                                            antibiotics      Encourage discontinuation           of antibiotics    ----------------------------     -----------------------------         PCT >= 0.50 ng/mL              PCT 0.26 - 0.50 ng/mL                AND       <80% decrease in PCT             Encourage initiation of                                             antibiotics       Encourage continuation           of antibiotics    ----------------------------     -----------------------------  PCT >= 0.50 ng/mL                  PCT > 0.50 ng/mL               AND         increase in PCT                   Strongly encourage                                      initiation of antibiotics    Strongly encourage escalation           of antibiotics                                     -----------------------------                                           PCT <= 0.25 ng/mL                                                 OR                                        > 80% decrease in PCT                                      Discontinue / Do not initiate                                             antibiotics  Performed at [facility], [address] [place], [place] [address]   Troponin I (High Sensitivity)     Status: Abnormal   Collection Time: 12/26/20 10:19 AM  Result Value Ref Range   Troponin I (High Sensitivity) 613 (HH) <18 ng/L    Comment: CRITICAL RESULT CALLED TO, READ BACK BY AND VERIFIED WITH: A.FOWLER RN @ 1154 12/26/2020 BY C.EDENS (NOTE) Elevated high sensitivity troponin I (hsTnI) values and significant  changes across serial measurements may suggest ACS but many other  chronic and acute conditions are known to elevate hsTnI results.  Refer to the Links section for chest pain algorithms and additional  guidance. Performed at [facility], [place] [address] [place], [place] [address]     MR BRAIN WO CONTRAST  Addendum Date: 12/25/2020   ADDENDUM REPORT: 12/25/2020 03:54 ADDENDUM: The pattern is most consistent with an embolic process. Electronically Signed   By: Ulyses Jarred M.D.   On: 12/25/2020 03:54   Result Date: 12/25/2020 CLINICAL DATA:  [place] EXAM: MRI HEAD WITHOUT CONTRAST TECHNIQUE: Multiplanar, multiecho pulse sequences of the brain and surrounding structures were obtained without intravenous contrast. COMPARISON:  09/08/2020 FINDINGS: Brain: Numerous punctate foci  of abnormal diffusion restriction within both hemispheres in multiple vascular territories. Multiple scattered microhemorrhages in a nonspecific pattern. There is multifocal hyperintense  T2-weighted signal within the white matter. Parenchymal volume and CSF spaces are normal. The midline structures are normal. Vascular: Major flow voids are preserved. Skull and upper cervical spine: Normal calvarium and skull base. Visualized upper cervical spine and soft tissues are normal. Sinuses/Orbits:No paranasal sinus fluid levels or advanced mucosal thickening. No mastoid or middle ear effusion. Normal orbits. IMPRESSION: 1. Numerous punctate foci of abnormal diffusion restriction within both hemispheres in multiple vascular territories, consistent with acute infarcts. 2. Multiple scattered microhemorrhages in a nonspecific pattern. Electronically Signed: By: Ulyses Jarred M.D. On: 12/25/2020 03:24   MR THORACIC SPINE WO CONTRAST  Result Date: 12/25/2020 CLINICAL DATA:  Back pain.  Leukocytosis.  MRSA bacteremia EXAM: MRI THORACIC SPINE WITHOUT CONTRAST TECHNIQUE: Multiplanar, multisequence MR imaging of the thoracic spine was performed. No intravenous contrast was administered. COMPARISON:  MRI 09/29/2020 FINDINGS: Alignment:  Physiologic. Vertebrae: There is fluid signal present within the T10-11 intervertebral disc space with bone marrow edema at the superior endplate of 624THL. More subtle changes along the inferior endplate of QA348G. Possible subtle central endplate erosion at 624THL (series 6, image 8). There is mild prevertebral soft tissue edema anterior to the T10 and T11 vertebral bodies (series 6, images 7-9). There is a similar degree of endplate marrow edema centered at the C6-7 intervertebral disc space, likely representing degenerative discogenic changes. No acute fracture. No suspicious bone lesion. Diffusely decreased T1 marrow signal throughout the visualized bone marrow, which may be related to chronic anemia. Cord: Normal signal and morphology. No evidence of epidural fluid collection. Paraspinal and other soft tissues: Small bilateral pleural effusions. No soft tissue fluid collection or  abscess. Disc levels: Mild right greater than left foraminal stenosis at the T10-11 level. No canal stenosis at any level within the thoracic spine. IMPRESSION: 1. 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 Mild prevertebral soft tissue edema at this level. No evidence of an epidural fluid collection or abscess. 2. Moderate endplate marrow edema centered at the C6-7 intervertebral disc space is similar to the previous MRI, likely representing degenerative discogenic changes. 3. Small bilateral pleural effusions. These results will be called to the ordering clinician or representative by the Radiologist Assistant, and communication documented in the PACS or [employer]. Electronically Signed   By: Davina Poke D.O.   On: 12/25/2020 17:51   ECHO TEE  Result Date: 12/24/2020    TRANSESOPHOGEAL ECHO REPORT   Patient Name:   Patrick Anthony Date of Exam: 12/24/2020 Medical Rec #:  [id number]      Height:       72.0 in Accession #:    [id number]     Weight:       159.8 lb Date of Birth:  1971/04/19      BSA:          1.937 m Patient Age:    65 years       BP:           155/91 mmHg Patient Gender: M              HR:           95 bpm. Exam Location:  Inpatient Procedure: 2D Echo, Cardiac Doppler, Color Doppler and Transesophageal Echo Indications:     Bacteremia  History:         Patient has prior history of Echocardiogram  examinations, most                  recent 12/22/2020.                   Mitral Valve: bioprosthetic valve valve is present in the                  mitral position.  Sonographer:     Merrie Roof RDCS Referring Phys:  [id number] [facility] Diagnosing Phys: Lyman Bishop MD PROCEDURE: After discussion of the risks and benefits of a TEE, an informed consent was obtained from the patient. The transesophogeal probe was passed without difficulty through the esophogus of the patient. Local oropharyngeal anesthetic was provided with Cetacaine. Sedation performed by  different physician. The patient was monitored while under deep sedation. The patient's vital signs; including heart rate, blood pressure, and oxygen saturation; remained stable throughout the procedure. The patient developed no complications during the procedure. IMPRESSIONS  1. Left ventricular ejection fraction, by estimation, is 60 to 65%. The left ventricle has normal function. The left ventricle has no regional wall motion abnormalities. There is moderate left ventricular hypertrophy.  2. Right ventricular systolic function is normal. The right ventricular size is severely enlarged. Mildly increased right ventricular wall thickness.  3. No left atrial/left atrial appendage thrombus was detected.  4. Right atrial size was severely dilated.  5. The mitral valve has been repaired/replaced. Trivial mitral valve regurgitation. There is a bioprosthetic valve present in the mitral position. Echo findings are consistent with vegetation the mitral prosthesis.  6. Aortic valve vegetation is visualized on the left.  7. The aortic valve is tricuspid. Aortic valve regurgitation is not visualized.  8. There is mild (Grade II) plaque. Conclusion(s)/Recommendation(s): Findings are concerning for vegetation/infective endocarditis as detailed above. FINDINGS  Left Ventricle: Left ventricular ejection fraction, by estimation, is 60 to 65%. The left ventricle has normal function. The left ventricle has no regional wall motion abnormalities. The left ventricular internal cavity size was normal in size. There is  moderate left ventricular hypertrophy. Right Ventricle: The right ventricular size is severely enlarged. Mildly increased right ventricular wall thickness. Right ventricular systolic function is normal. Left Atrium: Left atrial size was normal in size. No left atrial/left atrial appendage thrombus was detected. Right Atrium: Right atrial size was severely dilated. Pericardium: There is no evidence of pericardial effusion.  Mitral Valve: The mitral valve has been repaired/replaced. Trivial mitral valve regurgitation. There is a bioprosthetic valve present in the mitral position. Echo findings are consistent with vegetation on the mitral prosthesis. Tricuspid Valve: The tricuspid valve is grossly normal. Tricuspid valve regurgitation is mild. Aortic Valve: The aortic valve is tricuspid. Aortic valve regurgitation is not visualized. A mobile vegetation is seen on the left. The AoV vegetation measures 5 mm x 5 mm. Pulmonic Valve: The pulmonic valve was grossly normal. Pulmonic valve regurgitation is trivial. Aorta: The aortic root and ascending aorta are structurally normal, with no evidence of dilitation. There is mild (Grade II) plaque. IAS/Shunts: No atrial level shunt detected by color flow Doppler. Lyman Bishop MD Electronically signed by Lyman Bishop MD Signature Date/Time: 12/24/2020/3:38:39 PM    Final     Review of Systems  Constitutional: Positive for fever.  Respiratory: Negative for shortness of breath.   Cardiovascular: Positive for chest pain.  Gastrointestinal: Negative for abdominal pain.  Musculoskeletal: Positive for back pain.  Neurological: Negative for dizziness, syncope and headaches.  Hematological: Negative.  Blood pressure (!) 164/102, pulse (!) 126, temperature 100.1 F (37.8 C), temperature source Oral, resp. rate 17, height 6' (1.829 m), weight 75.2 kg, SpO2 98 %. Physical Exam Constitutional:      General: He is not in acute distress.    Appearance: Normal appearance.  HENT:     Head: Normocephalic and atraumatic.     Mouth/Throat:     Comments: Poor dentition Eyes:     Extraocular Movements: Extraocular movements intact.     Pupils: Pupils are equal, round, and reactive to light.  Cardiovascular:     Rate and Rhythm: Regular rhythm. Tachycardia present.     Heart sounds: Normal heart sounds. No murmur heard.   Pulmonary:     Effort: Pulmonary effort is normal.     Breath  sounds: Normal breath sounds.  Abdominal:     General: Abdomen is flat. Bowel sounds are normal. There is no distension.     Palpations: Abdomen is soft.     Tenderness: There is no abdominal tenderness.  Musculoskeletal:        General: No swelling.     Comments: Right above-knee amputation well-healed  Skin:    General: Skin is warm and dry.  Neurological:     General: No focal deficit present.     Mental Status: He is alert.    TRANSESOPHOGEAL ECHO REPORT       Patient Name:  Patrick Anthony Date of Exam: 12/24/2020  Medical Rec #: [id number]   Height:    72.0 in  Accession #:  [id number]   Weight:    159.8 lb  Date of Birth: 01-13-1971   BSA:     1.937 m  Patient Age:  27 years    BP:      155/91 mmHg  Patient Gender: M       HR:      95 bpm.  Exam Location: Inpatient   Procedure: 2D Echo, Cardiac Doppler, Color Doppler and Transesophageal  Echo   Indications:   Bacteremia    History:     Patient has prior history of Echocardiogram examinations,  most          recent 12/22/2020.            Mitral Valve: bioprosthetic valve valve is present in the          mitral position.    Sonographer:   Merrie Roof RDCS  Referring Phys: [id number] [facility]  Diagnosing Phys: Lyman Bishop MD   PROCEDURE: After discussion of the risks and benefits of a TEE, an  informed consent was obtained from the patient. The transesophogeal probe  was passed without difficulty through the esophogus of the patient. Local  oropharyngeal anesthetic was provided  with Cetacaine. Sedation performed by different physician. The patient was  monitored while under deep sedation. The patient's vital signs; including  heart rate, blood pressure, and oxygen saturation; remained stable  throughout the procedure. The patient  developed no complications during the procedure.   IMPRESSIONS    1. Left  ventricular ejection fraction, by estimation, is 60 to 65%. The  left ventricle has normal function. The left ventricle has no regional  wall motion abnormalities. There is moderate left ventricular hypertrophy.  2. Right ventricular systolic function is normal. The right ventricular  size is severely enlarged. Mildly increased right ventricular wall  thickness.  3. No left atrial/left atrial appendage thrombus was detected.  4. Right atrial size was severely dilated.  5.  The mitral valve has been repaired/replaced. Trivial mitral valve  regurgitation. There is a bioprosthetic valve present in the mitral  position. Echo findings are consistent with vegetation the mitral  prosthesis.  6. Aortic valve vegetation is visualized on the left.  7. The aortic valve is tricuspid. Aortic valve regurgitation is not  visualized.  8. There is mild (Grade II) plaque.   Conclusion(s)/Recommendation(s): Findings are concerning for  vegetation/infective endocarditis as detailed above.   FINDINGS  Left Ventricle: Left ventricular ejection fraction, by estimation, is 60  to 65%. The left ventricle has normal function. The left ventricle has no  regional wall motion abnormalities. The left ventricular internal cavity  size was normal in size. There is  moderate left ventricular hypertrophy.   Right Ventricle: The right ventricular size is severely enlarged. Mildly  increased right ventricular wall thickness. Right ventricular systolic  function is normal.   Left Atrium: Left atrial size was normal in size. No left atrial/left  atrial appendage thrombus was detected.   Right Atrium: Right atrial size was severely dilated.   Pericardium: There is no evidence of pericardial effusion.   Mitral Valve: The mitral valve has been repaired/replaced. Trivial mitral  valve regurgitation. There is a bioprosthetic valve present in the mitral  position. Echo findings are consistent with vegetation on  the mitral  prosthesis.   Tricuspid Valve: The tricuspid valve is grossly normal. Tricuspid valve  regurgitation is mild.   Aortic Valve: The aortic valve is tricuspid. Aortic valve regurgitation is  not visualized. A mobile vegetation is seen on the left. The AoV  vegetation measures 5 mm x 5 mm.   Pulmonic Valve: The pulmonic valve was grossly normal. Pulmonic valve  regurgitation is trivial.   Aorta: The aortic root and ascending aorta are structurally normal, with  no evidence of dilitation. There is mild (Grade II) plaque.   IAS/Shunts: No atrial level shunt detected by color flow Doppler.   Lyman Bishop MD  Electronically signed by Lyman Bishop MD  Signature Date/Time: 12/24/2020/3:38:39 PM     ADDENDUM REPORT: 12/25/2020 03:54  ADDENDUM: The pattern is most consistent with an embolic process.   Electronically Signed   By: Ulyses Jarred M.D.   On: 12/25/2020 03:54   Addended by Bella Kennedy, MD on 12/25/2020 3:56 AM    Study Result  Narrative & Impression  CLINICAL DATA:  Diplopia  EXAM: MRI HEAD WITHOUT CONTRAST  TECHNIQUE: Multiplanar, multiecho pulse sequences of the brain and surrounding structures were obtained without intravenous contrast.  COMPARISON:  09/08/2020  FINDINGS: Brain: Numerous punctate foci of abnormal diffusion restriction within both hemispheres in multiple vascular territories. Multiple scattered microhemorrhages in a nonspecific pattern. There is multifocal hyperintense T2-weighted signal within the white matter. Parenchymal volume and CSF spaces are normal. The midline structures are normal.  Vascular: Major flow voids are preserved.  Skull and upper cervical spine: Normal calvarium and skull base. Visualized upper cervical spine and soft tissues are normal.  Sinuses/Orbits:No paranasal sinus fluid levels or advanced mucosal thickening. No mastoid or middle ear effusion. Normal orbits.  IMPRESSION: 1.  Numerous punctate foci of abnormal diffusion restriction within both hemispheres in multiple vascular territories, consistent with acute infarcts. 2. Multiple scattered microhemorrhages in a nonspecific pattern.  Electronically Signed: By: Ulyses Jarred M.D. On: 12/25/2020 03:24      Narrative & Impression  CLINICAL DATA:  Back pain.  Leukocytosis.  MRSA bacteremia  EXAM: MRI THORACIC SPINE WITHOUT CONTRAST  TECHNIQUE: Multiplanar, multisequence  MR imaging of the thoracic spine was performed. No intravenous contrast was administered.  COMPARISON:  MRI 09/29/2020  FINDINGS: Alignment:  Physiologic.  Vertebrae: There is fluid signal present within the T10-11 intervertebral disc space with bone marrow edema at the superior endplate of 624THL. More subtle changes along the inferior endplate of QA348G. Possible subtle central endplate erosion at 624THL (series 6, image 8). There is mild prevertebral soft tissue edema anterior to the T10 and T11 vertebral bodies (series 6, images 7-9).  There is a similar degree of endplate marrow edema centered at the C6-7 intervertebral disc space, likely representing degenerative discogenic changes.  No acute fracture. No suspicious bone lesion. Diffusely decreased T1 marrow signal throughout the visualized bone marrow, which may be related to chronic anemia.  Cord: Normal signal and morphology. No evidence of epidural fluid collection.  Paraspinal and other soft tissues: Small bilateral pleural effusions. No soft tissue fluid collection or abscess.  Disc levels:  Mild right greater than left foraminal stenosis at the T10-11 level. No canal stenosis at any level within the thoracic spine.  IMPRESSION: 1. 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 Mild prevertebral soft tissue edema at this level. No evidence of an epidural fluid collection or abscess. 2. Moderate endplate marrow edema centered  at the C6-7 intervertebral disc space is similar to the previous MRI, likely representing degenerative discogenic changes. 3. Small bilateral pleural effusions.  These results will be called to the ordering clinician or representative by the Radiologist Assistant, and communication documented in the PACS or [employer].   Electronically Signed   By: Davina Poke D.O.   On: 12/25/2020 17:51     Final     Narrative & Impression  CLINICAL DATA:  Low back pain, possible infection  EXAM: MRI LUMBAR SPINE WITHOUT AND WITH CONTRAST  TECHNIQUE: Multiplanar and multiecho pulse sequences of the lumbar spine were obtained without and with intravenous contrast.  CONTRAST:  7.92m GADAVIST GADOBUTROL 1 MMOL/ML IV SOLN  COMPARISON:  Lumbar MRI 09/30/2020  FINDINGS: Despite efforts by the technologist and patient, motion artifact is present on today's exam and could not be eliminated. This reduces exam sensitivity and specificity.  Segmentation: The lowest lumbar type non-rib-bearing vertebra is labeled as L5.  Alignment:  Unremarkable  Vertebrae: Type 2 degenerative endplate findings at L075-GRMwith endplate irregularities, disc desiccation, and loss of disc height. This is mostly similar to the 09/30/2020 exam. There is also disc desiccation at L4-5 similar to prior. No significant abnormal degree of bony enhancement.  Conus medullaris and cauda equina: Conus extends to the L1 level. Conus and cauda equina appear normal.  Paraspinal and other soft tissues: Innumerable cysts of varying size and complexity scattered in both kidneys compatible with polycystic kidney disease. The kidneys are indistinct due to motion in today's exam was not optimized to assess the kidneys. Small retroperitoneal lymph nodes are not pathologically enlarged by size criteria.  Disc levels:  L1-2: Unremarkable.  L2-3: Unremarkable.  L3-4: Unremarkable.  L4-5:  Mild bilateral subarticular lateral recess stenosis due to disc bulge, minimally worsened from prior.  L5-S1: Mild bilateral subarticular lateral recess stenosis and borderline bilateral foraminal stenosis due to intervertebral spurring, disc bulge, and facet arthropathy, similar to prior.  IMPRESSION: 1. No findings of discitis or osteomyelitis. No compelling findings of epidural or psoas abscess. 2. Lumbar spondylosis and degenerative disc disease with mild impingement at L4-5 and L5-S1. Degenerative endplate findings particularly at L5-S1. 3. Autosomal dominant polycystic kidney  disease.   Electronically Signed   By: Van Clines M.D.   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  He has ongoing signs of sepsis with tachycardia, temperature 102, and a white blood cell count of 23.8.  There is no significant valvular dysfunction at this time.  He also has what appears to be acute bilateral septic emboli to the brain and did have septic emboli to the brain when he initially presented in March.  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  I do not think he is a candidate for redo mitral valve replacement and aortic valve replacement 3 months following his previous surgery.  I do not think he would survive that.  I will discuss the case with his primary surgeon Dr. Roxy Manns.  I would continue him on antibiotic therapy.  I discussed all this with him.  Gaye Pollack 12/26/2020, 1:09 PM

## 2020-12-26 NOTE — Progress Notes (Signed)
Patrick Anthony Progress Note   Subjective:    Seen in room. No new events. No complaints this am. No cp, sob.   Objective Vitals:   12/26/20 0018 12/26/20 0207 12/26/20 0500 12/26/20 0609  BP: (!) 160/107 (!) 148/91  (!) 156/106  Pulse:  (!) 105  (!) 103  Resp:  16  15  Temp:  98.5 F (36.9 C)  98 F (36.7 C)  TempSrc:  Oral  Oral  SpO2:  100%  100%  Weight:   75.2 kg   Height:       Physical Exam General: alert, oriented x3, male patient in NAD, sitting up in bed Heart: Tachy, regular, no mrg Lungs: clear bilaterally, occasional rhonchi  Abdomen:soft, NTND Extremities: R BKA, no edema Dialysis Access: LU AVG in use   Filed Weights   12/24/20 1144 12/25/20 0319 12/26/20 0500  Weight: 72.5 kg 69.9 kg 75.2 kg    Intake/Output Summary (Last 24 hours) at 12/26/2020 0948 Last data filed at 12/25/2020 2100 Gross per 24 hour  Intake 240 ml  Output --  Net 240 ml    Additional Objective Labs: Basic Metabolic Panel: Recent Labs  Lab 12/24/20 0347 12/25/20 0056 12/26/20 0117  NA 133*  133* 137 133*  K 3.9  3.9 3.3* 3.4*  CL 92*  92* 98 95*  CO2 '27  27 24 26  '$ GLUCOSE 105*  106* 90 107*  BUN 43*  42* 26* 37*  CREATININE 6.91*  6.83* 4.78* 5.95*  CALCIUM 9.1  9.2 8.7* 9.2  PHOS 1.8*  1.8* 1.5* 2.4*   Liver Function Tests: Recent Labs  Lab 12/24/20 0347 12/25/20 0056 12/26/20 0117  AST '21 22 25  '$ ALT '10 10 9  '$ ALKPHOS 79 81 76  BILITOT 1.0 1.2 1.1  PROT 8.6* 8.5* 8.4*  ALBUMIN 2.2*  2.2* 2.2* 2.2*   CBC: Recent Labs  Lab 12/22/20 0714 12/23/20 0119 12/24/20 0347 12/25/20 0056 12/26/20 0117  WBC 20.2* 19.7* 21.1* 28.9* 23.8*  NEUTROABS 18.0*  --  16.9* 23.7* 19.2*  HGB 9.4* 9.3* 9.7* 10.0* 9.7*  HCT 32.0* 31.0* 32.0* 32.8* 32.3*  MCV 82.5 80.7 80.8 80.6 79.2*  PLT 215 228 230 262 315   Blood Culture    Component Value Date/Time   SDES BLOOD BLOOD RIGHT FOREARM 12/24/2020 1537   SPECREQUEST  12/24/2020 1537    BOTTLES  DRAWN AEROBIC ONLY Blood Culture adequate volume   CULT  12/24/2020 1537    NO GROWTH 2 DAYS Performed at [facility], [address], [place], [place] [address]    REPTSTATUS PENDING 12/24/2020 1537    Studies/Results: MR BRAIN WO CONTRAST  Addendum Date: 12/25/2020   ADDENDUM REPORT: 12/25/2020 03:54 ADDENDUM: The pattern is most consistent with an embolic process. Electronically Signed   By: Ulyses Jarred M.D.   On: 12/25/2020 03:54   Result Date: 12/25/2020 CLINICAL DATA:  [place] EXAM: MRI HEAD WITHOUT CONTRAST TECHNIQUE: Multiplanar, multiecho pulse sequences of the brain and surrounding structures were obtained without intravenous contrast. COMPARISON:  09/08/2020 FINDINGS: Brain: Numerous punctate foci of abnormal diffusion restriction within both hemispheres in multiple vascular territories. Multiple scattered microhemorrhages in a nonspecific pattern. There is multifocal hyperintense T2-weighted signal within the white matter. Parenchymal volume and CSF spaces are normal. The midline structures are normal. Vascular: Major flow voids are preserved. Skull and upper cervical spine: Normal calvarium and skull base. Visualized upper cervical spine and soft tissues are normal. Sinuses/Orbits:No paranasal sinus fluid levels  or advanced mucosal thickening. No mastoid or middle ear effusion. Normal orbits. IMPRESSION: 1. Numerous punctate foci of abnormal diffusion restriction within both hemispheres in multiple vascular territories, consistent with acute infarcts. 2. Multiple scattered microhemorrhages in a nonspecific pattern. Electronically Signed: By: Ulyses Jarred M.D. On: 12/25/2020 03:24   MR THORACIC SPINE WO CONTRAST  Result Date: 12/25/2020 CLINICAL DATA:  Back pain.  Leukocytosis.  MRSA bacteremia EXAM: MRI THORACIC SPINE WITHOUT CONTRAST TECHNIQUE: Multiplanar, multisequence MR imaging of the thoracic spine was performed. No intravenous contrast was administered. COMPARISON:   MRI 09/29/2020 FINDINGS: Alignment:  Physiologic. Vertebrae: There is fluid signal present within the T10-11 intervertebral disc space with bone marrow edema at the superior endplate of 624THL. More subtle changes along the inferior endplate of QA348G. Possible subtle central endplate erosion at 624THL (series 6, image 8). There is mild prevertebral soft tissue edema anterior to the T10 and T11 vertebral bodies (series 6, images 7-9). There is a similar degree of endplate marrow edema centered at the C6-7 intervertebral disc space, likely representing degenerative discogenic changes. No acute fracture. No suspicious bone lesion. Diffusely decreased T1 marrow signal throughout the visualized bone marrow, which may be related to chronic anemia. Cord: Normal signal and morphology. No evidence of epidural fluid collection. Paraspinal and other soft tissues: Small bilateral pleural effusions. No soft tissue fluid collection or abscess. Disc levels: Mild right greater than left foraminal stenosis at the T10-11 level. No canal stenosis at any level within the thoracic spine. IMPRESSION: 1. 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 Mild prevertebral soft tissue edema at this level. No evidence of an epidural fluid collection or abscess. 2. Moderate endplate marrow edema centered at the C6-7 intervertebral disc space is similar to the previous MRI, likely representing degenerative discogenic changes. 3. Small bilateral pleural effusions. These results will be called to the ordering clinician or representative by the Radiologist Assistant, and communication documented in the PACS or [employer]. Electronically Signed   By: Davina Poke D.O.   On: 12/25/2020 17:51   ECHO TEE  Result Date: 12/24/2020    TRANSESOPHOGEAL ECHO REPORT   Patient Name:   Patrick Anthony Date of Exam: 12/24/2020 Medical Rec #:  [id number]      Height:       72.0 in Accession #:    [id number]     Weight:       159.8 lb  Date of Birth:  22-Nov-1970      BSA:          1.937 m Patient Age:    50 years       BP:           155/91 mmHg Patient Gender: M              HR:           95 bpm. Exam Location:  Inpatient Procedure: 2D Echo, Cardiac Doppler, Color Doppler and Transesophageal Echo Indications:     Bacteremia  History:         Patient has prior history of Echocardiogram examinations, most                  recent 12/22/2020.                   Mitral Valve: bioprosthetic valve valve is present in the                  mitral position.  Sonographer:  [place] Referring Phys:  [id number] [facility] Diagnosing Phys: Lyman Bishop MD PROCEDURE: After discussion of the risks and benefits of a TEE, an informed consent was obtained from the patient. The transesophogeal probe was passed without difficulty through the esophogus of the patient. Local oropharyngeal anesthetic was provided with Cetacaine. Sedation performed by different physician. The patient was monitored while under deep sedation. The patient's vital signs; including heart rate, blood pressure, and oxygen saturation; remained stable throughout the procedure. The patient developed no complications during the procedure. IMPRESSIONS  1. Left ventricular ejection fraction, by estimation, is 60 to 65%. The left ventricle has normal function. The left ventricle has no regional wall motion abnormalities. There is moderate left ventricular hypertrophy.  2. Right ventricular systolic function is normal. The right ventricular size is severely enlarged. Mildly increased right ventricular wall thickness.  3. No left atrial/left atrial appendage thrombus was detected.  4. Right atrial size was severely dilated.  5. The mitral valve has been repaired/replaced. Trivial mitral valve regurgitation. There is a bioprosthetic valve present in the mitral position. Echo findings are consistent with vegetation the mitral prosthesis.  6. Aortic valve vegetation is visualized on the left.   7. The aortic valve is tricuspid. Aortic valve regurgitation is not visualized.  8. There is mild (Grade II) plaque. Conclusion(s)/Recommendation(s): Findings are concerning for vegetation/infective endocarditis as detailed above. FINDINGS  Left Ventricle: Left ventricular ejection fraction, by estimation, is 60 to 65%. The left ventricle has normal function. The left ventricle has no regional wall motion abnormalities. The left ventricular internal cavity size was normal in size. There is  moderate left ventricular hypertrophy. Right Ventricle: The right ventricular size is severely enlarged. Mildly increased right ventricular wall thickness. Right ventricular systolic function is normal. Left Atrium: Left atrial size was normal in size. No left atrial/left atrial appendage thrombus was detected. Right Atrium: Right atrial size was severely dilated. Pericardium: There is no evidence of pericardial effusion. Mitral Valve: The mitral valve has been repaired/replaced. Trivial mitral valve regurgitation. There is a bioprosthetic valve present in the mitral position. Echo findings are consistent with vegetation on the mitral prosthesis. Tricuspid Valve: The tricuspid valve is grossly normal. Tricuspid valve regurgitation is mild. Aortic Valve: The aortic valve is tricuspid. Aortic valve regurgitation is not visualized. A mobile vegetation is seen on the left. The AoV vegetation measures 5 mm x 5 mm. Pulmonic Valve: The pulmonic valve was grossly normal. Pulmonic valve regurgitation is trivial. Aorta: The aortic root and ascending aorta are structurally normal, with no evidence of dilitation. There is mild (Grade II) plaque. IAS/Shunts: No atrial level shunt detected by color flow Doppler. Lyman Bishop MD Electronically signed by Lyman Bishop MD Signature Date/Time: 12/24/2020/3:38:39 PM    Final     Medications: . [START ON 12/27/2020] gentamicin    . sodium chloride    . vancomycin 750 mg (12/24/20 1657)   .  (feeding supplement) PROSource Plus  30 mL Oral BID BM  . Chlorhexidine Gluconate Cloth  6 each Topical Q0600  . Chlorhexidine Gluconate Cloth  6 each Topical Q0600  . cinacalcet  60 mg Oral Q supper  . docusate sodium  100 mg Oral BID  . feeding supplement (NEPRO CARB STEADY)  237 mL Oral TID BM  . methocarbamol  500 mg Oral TID  . multivitamin with minerals  1 tablet Oral Daily  . oxyCODONE  15 mg Oral Q12H  . pantoprazole  40 mg Oral Daily  .  pregabalin  75 mg Oral QHS  . senna-docusate  1 tablet Oral BID  . sodium chloride flush  3 mL Intravenous Q12H    Dialysis Orders: MWF -[facility] 4hrs, BFR350, DFR500, EDW 72kg,2K/2CaProfile 2  Access:LU AVG Heparin5500 unit qHD Mircera266mg q2wks - last 5/9 Venofer '100mg'$  qHD x5 - 1 completed  Assessment/Plan: 1. 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  2. 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  WBCs 28. On Gentamicin and vancomycin per pharmacy. s/p repeat TEE with MV vegetations.  For  MRI thoracic spine.  Per primary team. ID following.  3. Back pain - MRI lumbar spine with no discitis/osteomyelitis, +degenerative disc disease at L4-L5, L5-S1. ID plans for MRI T spine. 4. Hypoxia- resolved.  Now on RA.  5. ESRD- On HD MWF. Continue on schedule. Next HD 5/23. Added K+ bath.  6. Hypertension/volume- BP elevated. Resume home meds. Under dry weight if weights correct. UF has been limited by hypotension. Tolerated 3L UF on Friday  7. Anemiaof CKD- Hgb 9.7- Next ESA dose due 5/23. .Marland KitchenHold IV iron due to acute infection. 8. Secondary Hyperparathyroidism -Ca in goal. Phos low - binders stopped.  Continue sensipar '60mg'$  qd. Not on VDRA. 9. Nutrition- Renal diet w/fluid restriction. Protein supplement.   OLynnda ChildPA-C [facility] 12/26/2020,9:48 AM

## 2020-12-26 NOTE — Progress Notes (Signed)
PROGRESS NOTE    Patrick Anthony  [id number] DOB: 04-May-1971 DOA: 12/22/2020 PCP: [name], MD   Brief Narrative:  The patient is a 33 atrial African-American male with a past medical history significant for but not limited to end-stage renal disease on hemodialysis on Monday Wednesday Friday, chronic diastolic CHF, hyperlipidemia, history of liver cirrhosis secondary to hepatitis C, history of alcohol abuse, OSA as well as recent AKA done by Dr. Sharol Given on 10/29/2020 and other comorbidities who presented with a chief complaint of spiking fevers for last few days and then altered mental status.  Reportedly patient was at his facility when they were noticing that he was altered and history is difficult to be obtained from the patient himself.  Of note he has had a prolonged hospitalization from 09/27/20 until November 11, 2019 he was found to have MRSA bacteremia as well as mitral valve endocarditis with PFO and septic emboli to the brain along with that time.  He underwent a bioprosthetic mitral valve replacement along with closure of the PFO on 10/14/2020.  Also during that hospitalization he was treated for osteomyelitis and abscess of the right tibia status post AKA on 10/29/2020 by Dr. Sharol Given.  He was discharged to [facility] for continuation of vancomycin at the nursing facility until 11/25/2020.  Reportedly he is usually alert and oriented x4.  Yesterday he is only alert and oriented to person and place but not the situation.  He complained about severe pain in his back has been present for some time and he describes shooting pains in the right leg but was amputated.  He denies any shortness breath or cough.  In the ED he presented with a fever of 103 and rales were reported in the lower lung field on the right lung.  Upon arrival to the ED he was noted to be septic given that he had an elevated temperature, respirations as well as being tachycardia and had a new O2 requirement as well as being altered.   CT scan of the brain showed no acute abnormalities but did show several changes that were chronic injury to the areas of infarct noted on the previous MRI.  His WBC was elevated 20,200.  Chest x-ray showed scarring in the right upper lobe, middle lobe atelectasis and a small left-sided pleural effusion stable cardiomegaly.  Initial sepsis protocol was initiated and he was given IV vancomycin and cefepime.  Further work-up reveals that he has another MRSA bacteremia and infectious diseases is involved again and nephrology is involved as well.  Patient is to get a repeat TEE and this was done today 12/24/20 to evaluate his mitral valve. Still pending to get a MRI the T-spine with and without contrast with hemodialysis after.  UnFortunately a TEE shows likely bioprosthetic valve infection so infectious disease is now going to add gentamicin and rifampin once his cultures are cleared.  We will still pursue his thoracic spine MRI which is still pending to be done.  Overnight the patient developed blurred vision and he underwent a head MRI which showed recurrent cardioembolic strokes in the setting of septic emboli from infective endocarditis.  Neurology was consulted and they did not recommend further work-up given that he recently just had an MRI of the brain with and without contrast on 09/28/2020 as well as an echo and carotid Dopplers.  Neurology recommending holding antiplatelets given the risk of hemorrhagic conversion of recurrent infective endocarditis related strokes and resume in 1 week if patient remains stable.  Patient's aspirin was then discontinued.  MRI of the spine shows early discitis-osteomyelitis at the T10-T11 level with mild perivertebral soft tissue edema at this level and no evidence of epidural fluid collection or abscess.  There is also moderate endplate marrow edema centered at C6-7 intervertebral disc space similar to previous MRI and small bilateral pleural effusion.  Cardiothoracic  surgery evaluated and they feel that he is a candidate for redo mitral valve replacement and aortic valve replacement 3 months following previous surgery as they do not feel that he would survive this.  They recommend continuing antibiotic therapy.  This morning he went into SVT and heart rates spiked in the 150s and he was given at 250 mL bolus, tried vagal maneuvers which did not break his rhythm and he was given Tylenol for his temperature of 102.8.  Cardiology was consulted and he was given adenosine 6 mg and then it looked like 2-1 flutter and he was placed on a diltiazem drip and bolus.  Cardiology feels that he cannot be anticoagulated this time given his risk for hemorrhagic conversion.  Further review of telemetry showed that his heart rate may just represent sinus tachycardia and as there is no clear flutter waves and now cardiology transition him to oral diltiazem  Assessment & Plan:   Principal Problem:   Bacteremia due to Staphylococcus aureus Active Problems:   Anemia in chronic kidney disease   Hepatitis C   ESRD on dialysis (HCC)   S/P mitral valve replacement   S/P AKA (above knee amputation), right (HCC)   Personal history of Methicillin resistant Staphylococcus aureus infection   SIRS (systemic inflammatory response syndrome) (HCC)  Severe sepsis secondary to an MRSA bacteremia and Prostetic Valve Endocarditis present on admission -Presented the fever up to 103 F, tachycardia, tachypnea, a new oxygen requirement, altered mental status as well as a leukocytosis of 20,200 and also with hypotension -Lactic acid level was reassuring at 1.7 on admission -Chest x-ray on admission only noted low lung volumes and possible signs of atelectasis with no clear source of infection appreciated at that time -Blood cultures now revealing an MRSA bacteremia; Repeat Blood Cx showed no growth to date but will obtain another set today given that he became febrile and tachycardic -He has been  admitted progressive bed -He was initiated on IV antibiotics with vancomycin and cefepime but cefepime has now been discontinued -Infectious diseases has been consulted and plan further work-up for the patient and have ordered a repeat TEE and discussed with cardiology given his prostatic mitral valve and also will be getting a T-spine MRI with and without contrast with dialysis to be done afterwards; MRI done and showed that he has early changes of discitis and osteomyelitis at the T10-T11 level -Patient did get TEE which showed "Possible small vegetations associated with the bioprosthetic mitral valve and native aortic valve. No LAA thrombus. Negative for PFO. Moderate LVH Severe RA/RV dilitation with mild TR LVEF 60-65%." -We will be continuing vancomycin but because of Concern for possible Valvular Vegatations including a small, moblile Filamentous Structure on the posterior bioprosthetic vavlve leaflet was concerning for vegetation, ID now recommending PVE Coverage and will start Gentamicin and Add Rifampin once CX's Clear -procalcitonin level was 28.50 -Repeat blood cultures have been planned for 12/24/2020 -Appreciate further ID evaluation recommendations; sepsis physiology was improving but patient continues to still spike Temperatures and becomes intermittently tachycardic and tachypenic  -Patient's WBC minimally improved and went from 20.2 -> 19.7 -> 21.1 -> 28.9  and today it is 23.8 -Now infectious diseases recommending referral to cardiothoracic surgery and continuing gentamicin and vancomycin and adding rifampin later once blood cultures remain negative; cardiothoracic surgery feels that he is not a candidate for redoing his surgical intervention -Repeat Blood Cx's from today are pending but the ones drawn a few days ago showed no growth to date -Given his high risk for further decompensation and poor prognosis we will consult palliative care for goals of care discussion  Acute  CVAs -Patient has recurrent cardioembolic strokes in the setting of septic emboli from infective endocarditis -MRI of the brain overnight done and showed "Numerous punctate foci of abnormal diffusion restriction within both hemispheres in multiple vascular territories, consistent with acute infarcts.  Multiple scattered microhemorrhages in a nonspecific pattern." -Neurology was consulted and patient already had most of the stroke work-up done in the last few admissions and they did not recommend repeating echocardiogram as he had a TEE 520 confirming infective endocarditis -Neurology recommended holding antiplatelets at the time given risk of hemorrhagic conversion -DVT prophylaxis changed from heparin to SCDs and recommending resuming in 1 week -Neurology recommended continue telemetry monitoring and a 30-day event monitor on discharge if no arrhythmias were captured -They recommended blood pressure goal is normotensive and recommending and PT OT and SLP evaluation -We will need to continue monitor patient carefully  Arrhythmia/new onset SVT -Patient was found to be in SVT this morning and tried vagal maneuvers without success He was given a beta-blocker early this morning as well with out any improvement.  He is also given 250 normal saline bolus -Cardiology was consulted and troponins were obtained and his troponin level was 613 and trended up to 774 -Cardiology evaluated and gave the patient 6 mg of adenosine which slowed his rhythm and it looked like a 2-1 a flutter block so they started him on diltiazem drip -Once his arrhythmia improved and his heart rate slowed down cardiology reviewed his telemetry and they felt that it was not a flutter and may just represent sinus tachycardia so the will transition to oral diltiazem. -Cardiology following appreciate evaluation  Hypotension -In the setting of MRSA bacteremia his admission blood pressures were as low as 84/51 but have now  improved -Improved with IV fluid resuscitation -Continue to monitor blood pressures per protocol; blood pressures are still on the softer side but last blood pressure reading was 148/100 -His home blood pressure medications include clonidine 0.2 mg p.o. twice daily as well as metoprolol tartrate 50 mg p.o. twice daily which have both been held -Goal MAP bolus of at least 65 -Continue with IV fluid hydration as needed but will need to be judicious given that he is a dialysis patient  Acute metabolic encephalopathy in the setting of infection, improved  -He is noted to be alert and oriented x2 but was previously alert and oriented x4 -CT scan did not show any acute abnormalities; he does have a prior history of mitral valve endocarditis with septic emboli to the brain and to the lung -He appears more appropriate today and appears closer to his baseline so we will hold off further investigation -Continue with neurochecks per protocol -See Above   ESRD on Hemodialysis Monday Wednesday  Secondary hyperparathyroidism -Patient has a history of poorly cystic kidney disease Normally dialyzes Monday Wednesday Friday -He was dialyzed following contrast yesterday and will be dialyzed again tomorrow after the MRI is done -Nephrology been consulted and appreciate further evaluation recommendations and they are planning for dialysis  tomorrow first shift and he went early this AM -Patient's BUNs/creatinine went from 56/8.20 -> 29/4.91 -> 43/6.91 -> 26/4.78 -Given that patient's calcium is at goal and his phosphorus is low they will discontinue binders but they recommended continue Cinacalcet 60 mg p.o. 4 times daily; will discontinue sevelamer carbonate 600 g p.o. 3 times daily -Phos level is very low at 1.5 and will defer to Nephrology to replete  -Nephrology to dialyze the patient next on 12/27/2020 as patient tolerated 3 L of ultrafiltration on Friday  Hyponatremia -Mild. Na+ Went from 135 -> 133 -> 137  and today was 133 -Expect to be corrected in Dialysis -Continue to Monitor and Trend -Repeat CMP in the AM   Acute Respiratory Failure with Hypoxia, improved  -Was placed on 2 L supplemental oxygen via nasal cannula and does not wear supplemental oxygen at home -Chest x-ray yesterday showed "Lower lung volumes with atelectasis. Chronic cardiomegaly and right lung  Scarring." -Patient does have a history of MRSA bacteremia with septic emboli to the lungs -SpO2: 98 % O2 Flow Rate (L/min): 2 L/min -Continuous pulse oximetry maintain O2 saturations greater than 90% Continue supplemental oxygen via nasal cannula and wean O2 as tolerated -We will need a repeat chest x-ray and an ambulatory home O2 screen prior to discharge  History of MRSA bacteremia with mitral valve endocarditis and PFO and septic emboli to the brain and left lung -He previously completed antibiotics on 11/25/2020 but now is back with another bacteremia as above so is now going to be on IV Vancomycin, IV Gentamicin, and IV Rifampin once Cx's clear   Anemia of chronic kidney disease -Patient presented with a hemoglobin 9.4 g/dL which appears near his baseline; his hemoglobin/hematocrit today is stable at 9.7/32.3 -check anemia panel in the a.m. -Continue to monitor for signs and symptoms of bleeding; no overt bleeding noted -Repeat CBC in the a.m.  Hypokalemia -Mild -The patient is potassium went from 4.4 and is now 3.4 -Defer to nephrology to replete  -Continue monitor and replete as necessary and check magnesium level in the a.m. -Repeat CMP in the a.m.  Chronic Pain Syndrome with Chronic Opiate Dependence -He reports chronic back pain as well as right lower extremity that was recently amputated -Resuming home regimen of OxyContin and hydromorphone as needed however given his back pain will need to rule out infection etiology and he will undergo an MRI with and without contrast in the morning  Status post right  AKA -Recently had an AKA done in the last 2 months -Continue with Pregabalin 75 mg p.o. nightly  Liver cirrhosis secondary to chronic hepatitis C -His HCC quantitative was 1,750,000 on 09/29/2020 -Infectious diseases been consulted given his MRSA bacteremia again and appreciate further evaluation recommendations.  GERD -Continue with PPI with pantoprazole 40 mg p.o. daily  DVT prophylaxis: Heparin 5000 units subcu every 8 Code Status: FULL CODE  Family Communication: No family present at bedside but spoke to Daughter [place] via telephone Disposition Plan: Pending further clinical work-up and evaluation by infectious diseases as well as nephrology, Neurology, and TCTS  Status is: Inpatient  Remains inpatient appropriate because:Unsafe d/c plan, IV treatments appropriate due to intensity of illness or inability to take PO and Inpatient level of care appropriate due to severity of illness   Dispo: The patient is from: Home              Anticipated d/c is to: Home  Patient currently is not medically stable to d/c.   Difficult to place patient No  Consultants:   Infectious Diseases  Nephrology  Cardiology   Neurology  Cardiothoracic Surgery  Palliative Care   Procedures:  TEE done by Dr. Debara Pickett  Findings:  1. LEFT VENTRICLE: The left ventricular wall thickness is moderately increased.  The left ventricular cavity is normal in size. Wall motion is normal.  LVEF is 60-65%.  2. RIGHT VENTRICLE:  The right ventricle is severely dilated without any thrombus or masses.    3. LEFT ATRIUM:  The left atrium is normal in size without any thrombus or masses.  There is not spontaneous echo contrast ("smoke") in the left atrium consistent with a low flow state.  4. LEFT ATRIAL APPENDAGE:  The large left atrial appendage is free of any thrombus or masses. The appendage has single lobes. Pulse doppler indicates high flow in the appendage.  5. ATRIAL SEPTUM:  The atrial  septum appears intact and is free of thrombus and/or masses.  There is no evidence for interatrial shunting by color doppler and saline microbubble.  6. RIGHT ATRIUM:  The right atrium is severely dilated in size and function without any thrombus or masses.  7. 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 There is  trivial regurgitation.  There is a possible small mobile vegetation adherent to the posterior leaflet which appears filamentous, this could also be residual valve tissue or suture.   8. AORTIC VALVE:  The aortic valve is trileaflet, normal in structure and function with no regurgitation.  The valve was visualized with 2D/3D modes - there appears to be a small mobile density at the base of the left coronary cusp seen in the long-axis view on the outflow side of the leaflet, possibly consistent with vegetation.  9. TRICUSPID VALVE:  The tricuspid valve is normal in structure and function with Mild regurgitation.  There were no vegetations or stenosis  10.  PULMONIC VALVE:  The pulmonic valve is normal in structure and function with trivial regurgitation.  There were no vegetations or stenosis.   11. AORTIC ARCH, ASCENDING AND DESCENDING AORTA:  There was grade 2 Ron Parker et. Al, 1992) atherosclerosis of the ascending aorta, aortic arch, or proximal descending aorta.  12. PULMONARY VEINS: Anomalous pulmonary venous return was not noted.  13. PERICARDIUM: The pericardium appeared normal and non-thickened.  There is no pericardial effusion.  IMPRESSION:   1. 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  2. No LAA thrombus 3. Negative for PFO 4. Moderate LVH 5. Severe RA/RV dilitation with mild TR 6. LVEF 60-65%  RECOMMENDATIONS:    1. Consider longer course of antibiotics for possible valvular vegetations, including a small, mobile filamentous structure on the posterior bioprosthetic  valve leaflet which is concerning for vegetation.  Antimicrobials:  Anti-infectives (From admission, onward)   Start     Dose/Rate Route Frequency Ordered Stop   12/27/20 1800  gentamicin (GARAMYCIN) IVPB 80 mg        80 mg 100 mL/hr over 30 Minutes Intravenous Every M-W-F (1800) 12/24/20 1612     12/24/20 1700  gentamicin (GARAMYCIN) IVPB 120 mg        120 mg 200 mL/hr over 30 Minutes Intravenous  Once 12/24/20 1612 12/24/20 1910   12/24/20 1430  vancomycin (VANCOCIN) IVPB 750 mg/150 ml premix        750 mg 150 mL/hr over 60 Minutes  Intravenous Every M-W-F (Hemodialysis) 12/24/20 1324     12/22/20 1201  vancomycin variable dose per unstable renal function (pharmacist dosing)  Status:  Discontinued         Does not apply See admin instructions 12/22/20 1202 12/24/20 1324   12/22/20 1200  vancomycin (VANCOREADY) IVPB 750 mg/150 mL  Status:  Discontinued        750 mg 150 mL/hr over 60 Minutes Intravenous Every M-W-F (Hemodialysis) 12/22/20 0845 12/22/20 1202   12/22/20 1200  ceFEPIme (MAXIPIME) 2 g in sodium chloride 0.9 % 100 mL IVPB  Status:  Discontinued        2 g 200 mL/hr over 30 Minutes Intravenous Every M-W-F (Hemodialysis) 12/22/20 0845 12/23/20 1450   12/22/20 0715  vancomycin (VANCOREADY) IVPB 1500 mg/300 mL        1,500 mg 150 mL/hr over 120 Minutes Intravenous  Once 12/22/20 0714 12/22/20 1034   12/22/20 0715  ceFEPIme (MAXIPIME) 2 g in sodium chloride 0.9 % 100 mL IVPB        2 g 200 mL/hr over 30 Minutes Intravenous  Once 12/22/20 0714 12/22/20 0806       Subjective: Seen and examined at bedside and he was extremely tachycardic this morning and started grunting trying to breathe.  He became febrile 102.8 so he was given ice packs and 250 mL bolus as well as trying vagal maneuvers.  Because his arrhythmia did not break cardiology is consulted and they gave the patient 6 mg of adenosine which slowed his heart rate and revealed a possible 2-1 flutter block so he started on  a diltiazem drip.  His heart rate then further slowed down and after review of telemetry the cardiologist felt that it was just sinus tachycardia so they changed his diltiazem drip to p.o. diltiazem. No other concerns or complaints at this time and he denied any Chest Pain   Objective: Vitals:   12/26/20 1023 12/26/20 1200 12/26/20 1215 12/26/20 1300  BP: (!) 174/93 (!) 171/116 (!) 169/109 (!) 164/102  Pulse: (!) 140 (!) 130 (!) 133 (!) 126  Resp: '20 18 20 17  '$ Temp: 99.5 F (37.5 C) 100.1 F (37.8 C)    TempSrc: Oral Oral    SpO2: 94% 99% 97% 98%  Weight:      Height:        Intake/Output Summary (Last 24 hours) at 12/26/2020 1729 Last data filed at 12/26/2020 1551 Gross per 24 hour  Intake 702.86 ml  Output --  Net 702.86 ml   Filed Weights   12/24/20 1144 12/25/20 0319 12/26/20 0500  Weight: 72.5 kg 69.9 kg 75.2 kg   Examination: Physical Exam:  Constitutional: Chronically ill-appearing African-American male currently in moderate distress appears extremely cold having rigors and uncomfortable  Eyes: Lids and conjunctivae normal, sclerae anicteric  ENMT: External Ears, Nose appear normal. Grossly normal hearing.  Neck: Appears normal, supple, no cervical masses, normal ROM, no appreciable thyromegaly; no JVD Respiratory: Diminished to auscultation bilaterally, no wheezing, rales, rhonchi or crackles. Normal respiratory effort and patient is not tachypenic. No accessory muscle use.  Slightly labored breathing Cardiovascular: Tachycardic rate, no murmurs / rubs / gallops.  Mild left leg edema. Abdomen: Soft, non-tender, distended secondary body habitus. Bowel sounds positive.  GU: Deferred. Musculoskeletal: No clubbing / cyanosis of digits/nails. No joint deformity upper and lower extremities.  Has a right AKA Skin: No rashes, lesions, ulcers on limited skin evaluation. No induration; Warm and dry.  Neurologic: CN 2-12 grossly intact  with no focal deficits.  Romberg sign  cerebellar reflexes not assessed.  Psychiatric: Normal judgment and insight. Alert and oriented x 2.  Anxious mood and appropriate affect.    Data Reviewed: I have personally reviewed following labs and imaging studies  CBC: Recent Labs  Lab 12/22/20 0714 12/23/20 0119 12/24/20 0347 12/25/20 0056 12/26/20 0117  WBC 20.2* 19.7* 21.1* 28.9* 23.8*  NEUTROABS 18.0*  --  16.9* 23.7* 19.2*  HGB 9.4* 9.3* 9.7* 10.0* 9.7*  HCT 32.0* 31.0* 32.0* 32.8* 32.3*  MCV 82.5 80.7 80.8 80.6 79.2*  PLT 215 228 230 262 [id number]   Basic Metabolic Panel: Recent Labs  Lab 12/22/20 0714 12/23/20 0119 12/24/20 0347 12/25/20 0056 12/26/20 0117  NA 133* 135 133*  133* 137 133*  K 4.4 3.4* 3.9  3.9 3.3* 3.4*  CL 92* 95* 92*  92* 98 95*  CO2 '25 25 27  27 24 26  '$ GLUCOSE 82 66* 105*  106* 90 107*  BUN 56* 29* 43*  42* 26* 37*  CREATININE 8.20* 4.91* 6.91*  6.83* 4.78* 5.95*  CALCIUM 8.8* 8.4* 9.1  9.2 8.7* 9.2  MG  --   --  1.9 2.0 2.2  PHOS  --  1.8* 1.8*  1.8* 1.5* 2.4*   GFR: Estimated Creatinine Clearance: 16 mL/min (A) (by C-G formula based on SCr of 5.95 mg/dL (H)). Liver Function Tests: Recent Labs  Lab 12/22/20 0714 12/23/20 0119 12/24/20 0347 12/25/20 0056 12/26/20 0117  AST 20  --  '21 22 25  '$ ALT 10  --  '10 10 9  '$ ALKPHOS 74  --  79 81 76  BILITOT 1.0  --  1.0 1.2 1.1  PROT 8.4*  --  8.6* 8.5* 8.4*  ALBUMIN 2.4* 2.2* 2.2*  2.2* 2.2* 2.2*   No results for input(s): LIPASE, AMYLASE in the last 168 hours. No results for input(s): AMMONIA in the last 168 hours. Coagulation Profile: Recent Labs  Lab 12/22/20 0714  INR 1.3*   Cardiac Enzymes: No results for input(s): CKTOTAL, CKMB, CKMBINDEX, TROPONINI in the last 168 hours. BNP (last 3 results) No results for input(s): PROBNP in the last 8760 hours. HbA1C: No results for input(s): HGBA1C in the last 72 hours. CBG: Recent Labs  Lab 12/22/20 1402 12/22/20 1804  GLUCAP 83 82   Lipid Profile: No results for  input(s): CHOL, HDL, LDLCALC, TRIG, CHOLHDL, LDLDIRECT in the last 72 hours. Thyroid Function Tests: No results for input(s): TSH, T4TOTAL, FREET4, T3FREE, THYROIDAB in the last 72 hours. Anemia Panel: No results for input(s): VITAMINB12, FOLATE, FERRITIN, TIBC, IRON, RETICCTPCT in the last 72 hours. Sepsis Labs: Recent Labs  Lab 12/22/20 0714 12/26/20 1019  PROCALCITON  --  28.50  LATICACIDVEN 1.7  --     Recent Results (from the past 240 hour(s))  Resp Panel by RT-PCR (Flu A&B, Covid) Nasopharyngeal Swab     Status: None   Collection Time: 12/22/20  7:14 AM   Specimen: Nasopharyngeal Swab; Nasopharyngeal(NP) swabs in vial transport medium  Result Value Ref Range Status   SARS Coronavirus 2 by RT PCR NEGATIVE NEGATIVE Final    Comment: (NOTE) SARS-CoV-2 target nucleic acids are NOT DETECTED.  The SARS-CoV-2 RNA is generally detectable in upper respiratory specimens during the acute phase of infection. The lowest concentration of SARS-CoV-2 viral copies this assay can detect is 138 copies/mL. A negative result does not preclude SARS-Cov-2 infection and should not be used as the sole basis for treatment or other patient management decisions.  A negative result may occur with  improper specimen collection/handling, submission of specimen other than nasopharyngeal swab, presence of viral mutation(s) within the areas targeted by this assay, and inadequate number of viral copies(<138 copies/mL). A negative result must be combined with clinical observations, patient history, and epidemiological information. The expected result is Negative.  Fact Sheet for Patients:  EntrepreneurPulse.com.au  Fact Sheet for Healthcare Providers:  IncredibleEmployment.be  This test is no t yet approved or cleared by the [place] FDA and  has been authorized for detection and/or diagnosis of SARS-CoV-2 by FDA under an Emergency Use Authorization (EUA). This  EUA will remain  in effect (meaning this test can be used) for the duration of the COVID-19 declaration under Section 564(b)(1) of the Act, 21 U.S.C.section 360bbb-3(b)(1), unless the authorization is terminated  or revoked sooner.       Influenza A by PCR NEGATIVE NEGATIVE Final   Influenza B by PCR NEGATIVE NEGATIVE Final    Comment: (NOTE) The Xpert Xpress SARS-CoV-2/FLU/RSV plus assay is intended as an aid in the diagnosis of influenza from Nasopharyngeal swab specimens and should not be used as a sole basis for treatment. Nasal washings and aspirates are unacceptable for Xpert Xpress SARS-CoV-2/FLU/RSV testing.  Fact Sheet for Patients: EntrepreneurPulse.com.au  Fact Sheet for Healthcare Providers: IncredibleEmployment.be  This test is not yet approved or cleared by the [place] FDA and has been authorized for detection and/or diagnosis of SARS-CoV-2 by FDA under an Emergency Use Authorization (EUA). This EUA will remain in effect (meaning this test can be used) for the duration of the COVID-19 declaration under Section 564(b)(1) of the Act, 21 U.S.C. section 360bbb-3(b)(1), unless the authorization is terminated or revoked.  Performed at [facility], [place] [address] [place], [place] [address]   Blood Culture (routine x 2)     Status: Abnormal   Collection Time: 12/22/20  7:14 AM   Specimen: BLOOD  Result Value Ref Range Status   Specimen Description BLOOD SITE NOT SPECIFIED  Final   Special Requests   Final    BOTTLES DRAWN AEROBIC AND ANAEROBIC Blood Culture adequate volume   Culture  Setup Time   Final    GRAM POSITIVE COCCI IN CLUSTERS IN BOTH AEROBIC AND ANAEROBIC BOTTLES CRITICAL RESULT CALLED TO, READ BACK BY AND VERIFIED WITHDion Body [employer] 2028 12/22/20 A BROWNING Performed at [facility], [place] [address], [place], [place] [address]    Culture METHICILLIN RESISTANT STAPHYLOCOCCUS AUREUS (A)  Final    Report Status 12/24/2020 FINAL  Final   Organism ID, Bacteria METHICILLIN RESISTANT STAPHYLOCOCCUS AUREUS  Final      Susceptibility   Methicillin resistant staphylococcus aureus - MIC*    CIPROFLOXACIN 4 RESISTANT Resistant     ERYTHROMYCIN >=8 RESISTANT Resistant     GENTAMICIN <=0.5 SENSITIVE Sensitive     OXACILLIN >=4 RESISTANT Resistant     TETRACYCLINE <=1 SENSITIVE Sensitive     VANCOMYCIN 1 SENSITIVE Sensitive     TRIMETH/SULFA <=10 SENSITIVE Sensitive     CLINDAMYCIN <=0.25 SENSITIVE Sensitive     RIFAMPIN <=0.5 SENSITIVE Sensitive     Inducible Clindamycin NEGATIVE Sensitive     * METHICILLIN RESISTANT STAPHYLOCOCCUS AUREUS  Blood Culture ID Panel (Reflexed)     Status: Abnormal   Collection Time: 12/22/20  7:14 AM  Result Value Ref Range Status   Enterococcus faecalis NOT DETECTED NOT DETECTED Final   Enterococcus Faecium NOT DETECTED NOT DETECTED Final   Listeria monocytogenes  NOT DETECTED NOT DETECTED Final   Staphylococcus species DETECTED (A) NOT DETECTED Final    Comment: CRITICAL RESULT CALLED TO, READ BACK BY AND VERIFIED WITH: Dion Body PHARMD 2028 12/22/20 A BROWNING    Staphylococcus aureus (BCID) DETECTED (A) NOT DETECTED Final    Comment: Methicillin (oxacillin)-resistant Staphylococcus aureus (MRSA). MRSA is predictably resistant to beta-lactam antibiotics (except ceftaroline). Preferred therapy is vancomycin unless clinically contraindicated. Patient requires contact precautions if  hospitalized. CRITICAL RESULT CALLED TO, READ BACK BY AND VERIFIED WITH: Dion Body PHARMD 2028 12/22/20 A BROWNING    Staphylococcus epidermidis NOT DETECTED NOT DETECTED Final   Staphylococcus lugdunensis NOT DETECTED NOT DETECTED Final   Streptococcus species NOT DETECTED NOT DETECTED Final   Streptococcus agalactiae NOT DETECTED NOT DETECTED Final   Streptococcus pneumoniae NOT DETECTED NOT DETECTED Final   Streptococcus pyogenes NOT DETECTED NOT DETECTED Final    A.calcoaceticus-baumannii NOT DETECTED NOT DETECTED Final   Bacteroides fragilis NOT DETECTED NOT DETECTED Final   Enterobacterales NOT DETECTED NOT DETECTED Final   Enterobacter cloacae complex NOT DETECTED NOT DETECTED Final   Escherichia coli NOT DETECTED NOT DETECTED Final   Klebsiella aerogenes NOT DETECTED NOT DETECTED Final   Klebsiella oxytoca NOT DETECTED NOT DETECTED Final   Klebsiella pneumoniae NOT DETECTED NOT DETECTED Final   Proteus species NOT DETECTED NOT DETECTED Final   Salmonella species NOT DETECTED NOT DETECTED Final   Serratia marcescens NOT DETECTED NOT DETECTED Final   Haemophilus influenzae NOT DETECTED NOT DETECTED Final   Neisseria meningitidis NOT DETECTED NOT DETECTED Final   Pseudomonas aeruginosa NOT DETECTED NOT DETECTED Final   Stenotrophomonas maltophilia NOT DETECTED NOT DETECTED Final   Candida albicans NOT DETECTED NOT DETECTED Final   Candida auris NOT DETECTED NOT DETECTED Final   Candida glabrata NOT DETECTED NOT DETECTED Final   Candida krusei NOT DETECTED NOT DETECTED Final   Candida parapsilosis NOT DETECTED NOT DETECTED Final   Candida tropicalis NOT DETECTED NOT DETECTED Final   Cryptococcus neoformans/gattii NOT DETECTED NOT DETECTED Final   Meth resistant mecA/C and MREJ DETECTED (A) NOT DETECTED Final    Comment: CRITICAL RESULT CALLED TO, READ BACK BY AND VERIFIED WITHDion Body [facility] 2028 12/22/20 A BROWNING Performed at [facility], [address] [place], [place] [address]   Blood Culture (routine x 2)     Status: Abnormal   Collection Time: 12/22/20  7:19 AM   Specimen: BLOOD  Result Value Ref Range Status   Specimen Description BLOOD SITE NOT SPECIFIED  Final   Special Requests   Final    BOTTLES DRAWN AEROBIC AND ANAEROBIC Blood Culture results may not be optimal due to an excessive volume of blood received in culture bottles   Culture  Setup Time   Final    GRAM POSITIVE COCCI IN CLUSTERS IN BOTH AEROBIC AND  ANAEROBIC BOTTLES IDENTIFICATION TO FOLLOW CRITICAL RESULT CALLED TO, READ BACK BY AND VERIFIED WITH: J MILLEN PHARMD 2028 12/22/20 A BROWNING    Culture (A)  Final    STAPHYLOCOCCUS AUREUS SUSCEPTIBILITIES PERFORMED ON PREVIOUS CULTURE WITHIN THE LAST 5 DAYS. Performed at [facility], [place] [address] [place], [place] [address]    Report Status 12/24/2020 FINAL  Final  Culture, blood (routine x 2)     Status: None (Preliminary result)   Collection Time: 12/24/20  3:10 PM   Specimen: BLOOD RIGHT HAND  Result Value Ref Range Status   Specimen Description BLOOD RIGHT HAND  Final  Special Requests   Final    BOTTLES DRAWN AEROBIC ONLY Blood Culture adequate volume   Culture   Final    NO GROWTH 2 DAYS Performed at [facility], [place] [address], [place], [place] [address]    Report Status PENDING  Incomplete  Culture, blood (routine x 2)     Status: None (Preliminary result)   Collection Time: 12/24/20  3:37 PM   Specimen: BLOOD  Result Value Ref Range Status   Specimen Description BLOOD BLOOD RIGHT FOREARM  Final   Special Requests   Final    BOTTLES DRAWN AEROBIC ONLY Blood Culture adequate volume   Culture   Final    NO GROWTH 2 DAYS Performed at [facility], [place] [address], [place], [place] [address]    Report Status PENDING  Incomplete  MRSA PCR Screening     Status: None   Collection Time: 12/25/20 10:18 PM   Specimen: Nasal Mucosa; Nasopharyngeal  Result Value Ref Range Status   MRSA by PCR NEGATIVE NEGATIVE Final    Comment:        The GeneXpert MRSA Assay (FDA approved for NASAL specimens only), is one component of a comprehensive MRSA colonization surveillance program. It is not intended to diagnose MRSA infection nor to guide or monitor treatment for MRSA infections. Performed at [facility], [place] [address] [place], [place] [address]      RN Pressure Injury Documentation:     Estimated body mass index is 22.48 kg/m as  calculated from the following:   Height as of this encounter: 6' (1.829 m).   Weight as of this encounter: 75.2 kg.  Malnutrition Type:   Malnutrition Characteristics:   Nutrition Interventions:   Radiology Studies: MR BRAIN WO CONTRAST  Addendum Date: 12/25/2020   ADDENDUM REPORT: 12/25/2020 03:54 ADDENDUM: The pattern is most consistent with an embolic process. Electronically Signed   By: Ulyses Jarred M.D.   On: 12/25/2020 03:54   Result Date: 12/25/2020 CLINICAL DATA:  [place] EXAM: MRI HEAD WITHOUT CONTRAST TECHNIQUE: Multiplanar, multiecho pulse sequences of the brain and surrounding structures were obtained without intravenous contrast. COMPARISON:  09/08/2020 FINDINGS: Brain: Numerous punctate foci of abnormal diffusion restriction within both hemispheres in multiple vascular territories. Multiple scattered microhemorrhages in a nonspecific pattern. There is multifocal hyperintense T2-weighted signal within the white matter. Parenchymal volume and CSF spaces are normal. The midline structures are normal. Vascular: Major flow voids are preserved. Skull and upper cervical spine: Normal calvarium and skull base. Visualized upper cervical spine and soft tissues are normal. Sinuses/Orbits:No paranasal sinus fluid levels or advanced mucosal thickening. No mastoid or middle ear effusion. Normal orbits. IMPRESSION: 1. Numerous punctate foci of abnormal diffusion restriction within both hemispheres in multiple vascular territories, consistent with acute infarcts. 2. Multiple scattered microhemorrhages in a nonspecific pattern. Electronically Signed: By: Ulyses Jarred M.D. On: 12/25/2020 03:24   MR THORACIC SPINE WO CONTRAST  Result Date: 12/25/2020 CLINICAL DATA:  Back pain.  Leukocytosis.  MRSA bacteremia EXAM: MRI THORACIC SPINE WITHOUT CONTRAST TECHNIQUE: Multiplanar, multisequence MR imaging of the thoracic spine was performed. No intravenous contrast was administered. COMPARISON:  MRI  09/29/2020 FINDINGS: Alignment:  Physiologic. Vertebrae: There is fluid signal present within the T10-11 intervertebral disc space with bone marrow edema at the superior endplate of 624THL. More subtle changes along the inferior endplate of QA348G. Possible subtle central endplate erosion at 624THL (series 6, image 8). There is mild prevertebral soft tissue edema anterior  to the T10 and T11 vertebral bodies (series 6, images 7-9). There is a similar degree of endplate marrow edema centered at the C6-7 intervertebral disc space, likely representing degenerative discogenic changes. No acute fracture. No suspicious bone lesion. Diffusely decreased T1 marrow signal throughout the visualized bone marrow, which may be related to chronic anemia. Cord: Normal signal and morphology. No evidence of epidural fluid collection. Paraspinal and other soft tissues: Small bilateral pleural effusions. No soft tissue fluid collection or abscess. Disc levels: Mild right greater than left foraminal stenosis at the T10-11 level. No canal stenosis at any level within the thoracic spine. IMPRESSION: 1. 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 Mild prevertebral soft tissue edema at this level. No evidence of an epidural fluid collection or abscess. 2. Moderate endplate marrow edema centered at the C6-7 intervertebral disc space is similar to the previous MRI, likely representing degenerative discogenic changes. 3. Small bilateral pleural effusions. These results will be called to the ordering clinician or representative by the Radiologist Assistant, and communication documented in the PACS or [employer]. Electronically Signed   By: Davina Poke D.O.   On: 12/25/2020 17:51   Scheduled Meds: . (feeding supplement) PROSource Plus  30 mL Oral BID BM  . Chlorhexidine Gluconate Cloth  6 each Topical Q0600  . Chlorhexidine Gluconate Cloth  6 each Topical Q0600  . cinacalcet  60 mg Oral Q supper  . diltiazem   30 mg Oral Q6H  . docusate sodium  100 mg Oral BID  . feeding supplement (NEPRO CARB STEADY)  237 mL Oral TID BM  . methocarbamol  500 mg Oral TID  . multivitamin with minerals  1 tablet Oral Daily  . oxyCODONE  15 mg Oral Q12H  . pantoprazole  40 mg Oral Daily  . pregabalin  75 mg Oral QHS  . senna-docusate  1 tablet Oral BID  . sodium chloride flush  3 mL Intravenous Q12H   Continuous Infusions: . [START ON 12/27/2020] gentamicin    . vancomycin 750 mg (12/24/20 1657)    LOS: 4 days   Kerney Elbe, DO [facility] PAGER is on [place]  If 7PM-7AM, please contact night-coverage [email or web address]

## 2020-12-27 DIAGNOSIS — T826XXA Infection and inflammatory reaction due to cardiac valve prosthesis, initial encounter: Secondary | ICD-10-CM | POA: Diagnosis not present

## 2020-12-27 DIAGNOSIS — I248 Other forms of acute ischemic heart disease: Secondary | ICD-10-CM

## 2020-12-27 DIAGNOSIS — I38 Endocarditis, valve unspecified: Secondary | ICD-10-CM | POA: Diagnosis not present

## 2020-12-27 DIAGNOSIS — N186 End stage renal disease: Secondary | ICD-10-CM | POA: Diagnosis not present

## 2020-12-27 DIAGNOSIS — B182 Chronic viral hepatitis C: Secondary | ICD-10-CM | POA: Diagnosis not present

## 2020-12-27 DIAGNOSIS — B9561 Methicillin susceptible Staphylococcus aureus infection as the cause of diseases classified elsewhere: Secondary | ICD-10-CM | POA: Diagnosis not present

## 2020-12-27 DIAGNOSIS — I471 Supraventricular tachycardia: Secondary | ICD-10-CM | POA: Diagnosis not present

## 2020-12-27 DIAGNOSIS — R7881 Bacteremia: Secondary | ICD-10-CM | POA: Diagnosis not present

## 2020-12-27 DIAGNOSIS — Z89611 Acquired absence of right leg above knee: Secondary | ICD-10-CM | POA: Diagnosis not present

## 2020-12-27 DIAGNOSIS — Z952 Presence of prosthetic heart valve: Secondary | ICD-10-CM | POA: Diagnosis not present

## 2020-12-27 LAB — CBC WITH DIFFERENTIAL/PLATELET
Abs Immature Granulocytes: 0.33 10*3/uL — ABNORMAL HIGH (ref 0.00–0.07)
Basophils Absolute: 0.1 10*3/uL (ref 0.0–0.1)
Basophils Relative: 0 %
Eosinophils Absolute: 0.2 10*3/uL (ref 0.0–0.5)
Eosinophils Relative: 1 %
HCT: 30.7 % — ABNORMAL LOW (ref 39.0–52.0)
Hemoglobin: 9.2 g/dL — ABNORMAL LOW (ref 13.0–17.0)
Immature Granulocytes: 2 %
Lymphocytes Relative: 8 %
Lymphs Abs: 1.7 10*3/uL (ref 0.7–4.0)
MCH: 23.7 pg — ABNORMAL LOW (ref 26.0–34.0)
MCHC: 30 g/dL (ref 30.0–36.0)
MCV: 78.9 fL — ABNORMAL LOW (ref 80.0–100.0)
Monocytes Absolute: 2.8 10*3/uL — ABNORMAL HIGH (ref 0.1–1.0)
Monocytes Relative: 14 %
Neutro Abs: 15.3 10*3/uL — ABNORMAL HIGH (ref 1.7–7.7)
Neutrophils Relative %: 75 %
Platelets: 330 10*3/uL (ref 150–400)
RBC: 3.89 MIL/uL — ABNORMAL LOW (ref 4.22–5.81)
RDW: 20.7 % — ABNORMAL HIGH (ref 11.5–15.5)
WBC: 20.4 10*3/uL — ABNORMAL HIGH (ref 4.0–10.5)
nRBC: 0 % (ref 0.0–0.2)

## 2020-12-27 LAB — COMPREHENSIVE METABOLIC PANEL
ALT: 10 U/L (ref 0–44)
AST: 22 U/L (ref 15–41)
Albumin: 2.1 g/dL — ABNORMAL LOW (ref 3.5–5.0)
Alkaline Phosphatase: 71 U/L (ref 38–126)
Anion gap: 15 (ref 5–15)
BUN: 48 mg/dL — ABNORMAL HIGH (ref 6–20)
CO2: 22 mmol/L (ref 22–32)
Calcium: 9.4 mg/dL (ref 8.9–10.3)
Chloride: 95 mmol/L — ABNORMAL LOW (ref 98–111)
Creatinine, Ser: 7.45 mg/dL — ABNORMAL HIGH (ref 0.61–1.24)
GFR, Estimated: 8 mL/min — ABNORMAL LOW (ref >60)
Glucose, Bld: 96 mg/dL (ref 70–99)
Potassium: 3.3 mmol/L — ABNORMAL LOW (ref 3.5–5.1)
Sodium: 132 mmol/L — ABNORMAL LOW (ref 135–145)
Total Bilirubin: 1.5 mg/dL — ABNORMAL HIGH (ref 0.3–1.2)
Total Protein: 8.7 g/dL — ABNORMAL HIGH (ref 6.5–8.1)

## 2020-12-27 LAB — PHOSPHORUS: Phosphorus: 3 mg/dL (ref 2.5–4.6)

## 2020-12-27 LAB — MAGNESIUM: Magnesium: 2.3 mg/dL (ref 1.7–2.4)

## 2020-12-27 LAB — PROCALCITONIN: Procalcitonin: 28.84 ng/mL

## 2020-12-27 MED ORDER — SODIUM CHLORIDE 0.9 % IV SOLN: 100.0000 mL | INTRAVENOUS | Status: DC | PRN

## 2020-12-27 MED ORDER — VANCOMYCIN HCL 750 MG/150ML IV SOLN
INTRAVENOUS | Status: AC
  Filled 2020-12-27: qty 150

## 2020-12-27 MED ORDER — LIDOCAINE HCL (PF) 1 % IJ SOLN: 5.0000 mL | INTRAMUSCULAR | Status: DC | PRN

## 2020-12-27 MED ORDER — METOPROLOL TARTRATE 5 MG/5ML IV SOLN
5.0000 mg | Freq: Once | INTRAVENOUS | Status: AC
  Administered 2020-12-27: 5 mg via INTRAVENOUS
  Filled 2020-12-27: qty 5

## 2020-12-27 MED ORDER — ALTEPLASE 2 MG IJ SOLR: 2.0000 mg | Freq: Once | INTRAMUSCULAR | Status: DC | PRN

## 2020-12-27 MED ORDER — LIDOCAINE-PRILOCAINE 2.5-2.5 % EX CREA: 1.0000 "application " | TOPICAL_CREAM | CUTANEOUS | Status: DC | PRN

## 2020-12-27 MED ORDER — PENTAFLUOROPROP-TETRAFLUOROETH EX AERO: 1.0000 "application " | INHALATION_SPRAY | CUTANEOUS | Status: DC | PRN

## 2020-12-27 MED ORDER — HEPARIN SODIUM (PORCINE) 1000 UNIT/ML DIALYSIS: 1000.0000 [IU] | INTRAMUSCULAR | Status: DC | PRN

## 2020-12-27 NOTE — Progress Notes (Signed)
Patient being transported to dialysis at this time.

## 2020-12-27 NOTE — Anesthesia Postprocedure Evaluation (Signed)
Anesthesia Post Note  Patient: Patrick Anthony  Procedure(s) Performed: TRANSESOPHAGEAL ECHOCARDIOGRAM (TEE) (N/A )     Patient location during evaluation: Endoscopy Anesthesia Type: MAC Level of consciousness: awake and alert Pain management: pain level controlled Vital Signs Assessment: post-procedure vital signs reviewed and stable Respiratory status: spontaneous breathing, nonlabored ventilation, respiratory function stable and patient connected to nasal cannula oxygen Cardiovascular status: stable and blood pressure returned to baseline Postop Assessment: no apparent nausea or vomiting Anesthetic complications: no   No complications documented.  Last Vitals:  Vitals:   12/27/20 0800 12/27/20 0830  BP: (!) 159/98 (!) 184/106  Pulse:    Resp: 20 (!) 21  Temp:    SpO2:      Last Pain:  Vitals:   12/27/20 0720  TempSrc:   PainSc: 0-No pain                 [name]

## 2020-12-27 NOTE — Progress Notes (Signed)
   12/27/20 1423  Assess: MEWS Score  Temp (!) 102.9 F (39.4 C)  BP (!) 144/91  Pulse Rate (!) 124  ECG Heart Rate (!) 123  Resp 20  SpO2 96 %  O2 Device Room Air  Patient Activity (if Appropriate) In bed  Assess: MEWS Score  MEWS Temp 2  MEWS Systolic 0  MEWS Pulse 2  MEWS RR 0  MEWS LOC 0  MEWS Score 4  MEWS Score Color Red  Assess: if the MEWS score is Yellow or Red  Were vital signs taken at a resting state? Yes  Focused Assessment No change from prior assessment  Early Detection of Sepsis Score *See Row Information* High  MEWS guidelines implemented *See Row Information* Yes  Treat  MEWS Interventions Administered scheduled meds/treatments  Pain Scale 0-10  Pain Score 0  Take Vital Signs  Increase Vital Sign Frequency  Red: Q 1hr X 4 then Q 4hr X 4, if remains red, continue Q 4hrs  Escalate  MEWS: Escalate Red: discuss with charge nurse/RN and provider, consider discussing with RRT  Notify: Charge Nurse/RN  Name of Charge Nurse/RN Notified Liana, RN  Date Charge Nurse/RN Notified 12/27/20  Time Charge Nurse/RN Notified 1456  Notify: Provider  Provider Name/Title Raiford Noble  Date Provider Notified 12/27/20  Time Provider Notified 1457  Notification Type Page  Notification Reason Other (Comment) (notify of RED MEWS)  Provider response Other (Comment) (give tylenol and ice packs)  Date of Provider Response 12/27/20  Time of Provider Response 1503  Notify: Rapid Response  Name of Rapid Response RN Notified n/a  Document  Progress note created (see row info) Yes

## 2020-12-27 NOTE — Progress Notes (Signed)
Progress Note  Patient Name: Patrick Anthony Date of Encounter: 12/27/2020  [facility] Cardiologist: Fransico Him, MD   Subjective   Denies chest pain or dyspnea.  Reported palpitations yesterday but none today  Inpatient Medications    Scheduled Meds: . (feeding supplement) PROSource Plus  30 mL Oral BID BM  . Chlorhexidine Gluconate Cloth  6 each Topical Q0600  . cinacalcet  60 mg Oral Q supper  . diltiazem  30 mg Oral Q6H  . docusate sodium  100 mg Oral BID  . feeding supplement (NEPRO CARB STEADY)  237 mL Oral TID BM  . methocarbamol  500 mg Oral TID  . multivitamin with minerals  1 tablet Oral Daily  . oxyCODONE  15 mg Oral Q12H  . pantoprazole  40 mg Oral Daily  . pregabalin  75 mg Oral QHS  . senna-docusate  1 tablet Oral BID  . sodium chloride flush  3 mL Intravenous Q12H   Continuous Infusions: . [START ON 12/28/2020] sodium chloride    . [START ON 12/28/2020] sodium chloride    . gentamicin    . vancomycin 750 mg (12/24/20 1657)   PRN Meds: [START ON 12/28/2020] sodium chloride, [START ON 12/28/2020] sodium chloride, acetaminophen **OR** acetaminophen, albuterol, [START ON 12/28/2020] alteplase, bisacodyl, [START ON 12/28/2020] heparin, [START ON 12/28/2020] lidocaine (PF), [START ON 12/28/2020] lidocaine-prilocaine, ondansetron **OR** ondansetron (ZOFRAN) IV, [START ON 12/28/2020] pentafluoroprop-tetrafluoroeth, polyethylene glycol   Vital Signs    Vitals:   12/27/20 0728 12/27/20 0730 12/27/20 0800 12/27/20 0830  BP: (!) 171/113  (!) 159/98 (!) 184/106  Pulse:      Resp: 18 (!) 22 20 (!) 21  Temp:      TempSrc:      SpO2: 97% 97%    Weight:      Height:        Intake/Output Summary (Last 24 hours) at 12/27/2020 0855 Last data filed at 12/26/2020 2300 Gross per 24 hour  Intake 762.86 ml  Output --  Net 762.86 ml   Last 3 Weights 12/27/2020 12/27/2020 12/26/2020  Weight (lbs) 165 lb 2 oz 167 lb 15.9 oz 165 lb 12.6 oz  Weight (kg) 74.9 kg 76.2 kg 75.2 kg       Telemetry    Sinus tachycardia 100- Personally Reviewed  ECG    No new ECG- Personally Reviewed  Physical Exam   GEN: No acute distress.   Neck: No JVD Cardiac: Tachycardic, regular, no murmurs Respiratory: Clear to auscultation bilaterally. GI: Soft, nontender, non-distended  MS: No edema; No deformity. Neuro:  Nonfocal  Psych: Normal affect   Labs    High Sensitivity Troponin:   Recent Labs  Lab 12/26/20 1019 12/26/20 1214  TROPONINIHS 613* 774*      Chemistry Recent Labs  Lab 12/25/20 0056 12/26/20 0117 12/27/20 0218  NA 137 133* 132*  K 3.3* 3.4* 3.3*  CL 98 95* 95*  CO2 '24 26 22  '$ GLUCOSE 90 107* 96  BUN 26* 37* 48*  CREATININE 4.78* 5.95* 7.45*  CALCIUM 8.7* 9.2 9.4  PROT 8.5* 8.4* 8.7*  ALBUMIN 2.2* 2.2* 2.1*  AST '22 25 22  '$ ALT '10 9 10  '$ ALKPHOS 81 76 71  BILITOT 1.2 1.1 1.5*  GFRNONAA 14* 11* 8*  ANIONGAP '15 12 15     '$ Hematology Recent Labs  Lab 12/25/20 0056 12/26/20 0117 12/27/20 0218  WBC 28.9* 23.8* 20.4*  RBC 4.07* 4.08* 3.89*  HGB 10.0* 9.7* 9.2*  HCT 32.8* 32.3* 30.7*  MCV 80.6 79.2* 78.9*  MCH 24.6* 23.8* 23.7*  MCHC 30.5 30.0 30.0  RDW 20.6* 20.5* 20.7*  PLT 262 315 330    BNPNo results for input(s): BNP, PROBNP in the last 168 hours.   DDimer No results for input(s): DDIMER in the last 168 hours.   Radiology    MR THORACIC SPINE WO CONTRAST  Result Date: 12/25/2020 CLINICAL DATA:  Back pain.  Leukocytosis.  MRSA bacteremia EXAM: MRI THORACIC SPINE WITHOUT CONTRAST TECHNIQUE: Multiplanar, multisequence MR imaging of the thoracic spine was performed. No intravenous contrast was administered. COMPARISON:  MRI 09/29/2020 FINDINGS: Alignment:  Physiologic. Vertebrae: There is fluid signal present within the T10-11 intervertebral disc space with bone marrow edema at the superior endplate of 624THL. More subtle changes along the inferior endplate of QA348G. Possible subtle central endplate erosion at 624THL (series 6, image 8).  There is mild prevertebral soft tissue edema anterior to the T10 and T11 vertebral bodies (series 6, images 7-9). There is a similar degree of endplate marrow edema centered at the C6-7 intervertebral disc space, likely representing degenerative discogenic changes. No acute fracture. No suspicious bone lesion. Diffusely decreased T1 marrow signal throughout the visualized bone marrow, which may be related to chronic anemia. Cord: Normal signal and morphology. No evidence of epidural fluid collection. Paraspinal and other soft tissues: Small bilateral pleural effusions. No soft tissue fluid collection or abscess. Disc levels: Mild right greater than left foraminal stenosis at the T10-11 level. No canal stenosis at any level within the thoracic spine. IMPRESSION: 1. 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 Mild prevertebral soft tissue edema at this level. No evidence of an epidural fluid collection or abscess. 2. Moderate endplate marrow edema centered at the C6-7 intervertebral disc space is similar to the previous MRI, likely representing degenerative discogenic changes. 3. Small bilateral pleural effusions. These results will be called to the ordering clinician or representative by the Radiologist Assistant, and communication documented in the PACS or [employer]. Electronically Signed   By: Davina Poke D.O.   On: 12/25/2020 17:51    Cardiac Studies    Patient Profile     50 y.o. 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  SVT, possibly atypical atrial flutter -He was given adenosine 6 mg with possible flutter waves per Dr Audie Box.  Buddy Duty on diltiazem drip.  Currently on sinus tachycardia with rate 100s and has been switched  to p.o. diltiazem 30 mg 4 times daily.  -Unclear if atrial flutter and not a good candidate for anticoagulation given multiple cardioembolic infarcts from mitral valve endocarditis with high risk of hemorrhagic conversion  2.  Bioprosthetic mitral valve endocarditis -Blood cultures on 5/18 growing MRSA.  TEE 5/20 concerning for MV bioprosthetic endocarditis, trivial MR -Evaluated by Dr. Cyndia Bent, not a good candidate for redo mitral valve surgery. -Would continue antibiotics.  3. Troponin elevation -Troponin peak 774 -Likely demand ischemia in setting of acute illness, with bacteremia and tachyarrhythmia as above -Normal coronaries on cath 10/2020  For questions or updates, please contact [place] Please consult [email or web address] for contact info under        Signed, Donato Heinz, MD  12/27/2020, 8:55 AM

## 2020-12-27 NOTE — Progress Notes (Signed)
CSW following for discharge needs.  Geraldean Walen LCSW

## 2020-12-27 NOTE — Consult Note (Incomplete)
Consultation Note Date: 12/27/2020   Patient Name: Patrick Anthony  DOB: Oct 06, 1970  MRN: [id number]  Age / Sex: 50 y.o., male  PCP: [name], MD Referring Physician: Kerney Elbe, DO  Reason for Consultation: Establishing goals of care and Psychosocial/spiritual support  HPI/Patient Profile: 50 y.o. 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  History is difficult to obtain from the patient as he seems to forget or possibly be unable to focus on questions and repeats himself.  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  Patient underwent bioprosthetic mitral valve replacement along with closure of the PFO on 3/10. 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 Sharol Given.  Patient was able to be discharged to [facility] for continuation of vancomycin at the nursing facility through 4/21.  Records note patient was normally reported to to be alert and oriented x4.  At this time patient only appears oriented to person and place, but not to current situation.  He complains of severe pain in his lower back that has been present for quite some time and sharp shooting pains in his right leg that was amputated.  He states that the rehab place that they sent him is horrible and he does not want to go back.  Denies having any cough, shortness of breath, chest pain, nausea, vomiting, or wounds.    In route with EMS patient was reported to have fever up to  103 F with rales reported in the right lower lung field      Clinical Assessment and Goals of Care:   This NP Wadie Lessen reviewed medical records, received report from team, assessed the patient and then meet at the patient's bedside  to discuss diagnosis, prognosis, GOC, EOL wishes disposition and options.   Concept of Palliative Care was introduced as specialized medical care for people and their families living with serious illness.  If focuses on providing relief from the symptoms and stress of a serious illness.  The goal is to improve quality of life for both the patient and the family. Values and goals of care important to patient and family were attempted to be elicited.  Created space and opportunity for patient  and family to explore thoughts and feelings regarding current medical situation     A  discussion was had today regarding advanced directives.  Concepts specific to code status, artifical feeding and hydration, continued IV antibiotics and rehospitalization was had.  The difference between a aggressive medical intervention path  and a palliative comfort care path for this patient at this time was had.    MOST form    Natural trajectory and expectations at EOL were discussed.  Questions and concerns addressed.  Patient  encouraged to call with questions or concerns.     PMT will continue to support holistically.          {Primary Decision WM:[id number]    SUMMARY OF RECOMMENDATIONS    Code Status/Advance Care Planning:  {Palliative Code status:23503}    Symptom Management:   ***  Palliative Prophylaxis:   {Palliative Prophylaxis:21015}  Additional Recommendations (Limitations, Scope, Preferences):  {Recommended Scope and Preferences:21019}  Psycho-social/Spiritual:   Desire for further Chaplaincy support:{YES NO:22349}  Additional Recommendations: {PAL SOCIAL:21064}  Prognosis:   {Palliative Care Prognosis:23504}  Discharge Planning:  {Palliative dispostion:23505}      Primary Diagnoses: Present on Admission: . SIRS (systemic inflammatory response syndrome) (HCC) . Personal history of Methicillin resistant Staphylococcus aureus infection . Anemia in chronic kidney disease . Hepatitis C . Bacteremia due to Staphylococcus aureus   I have reviewed the medical record, interviewed the patient and family, and examined the patient. The following aspects are pertinent.  Past Medical History:  Diagnosis Date  . 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 Chronic kidney disease    patient on dialysis tues-thurs-sat  . Cirrhosis of liver ([place])   . Drug addiction ([place])   . ETOH abuse   . Hepatitis   . Hypertension   . Mitral regurgitation   . Patent foramen ovale 10/23/2019  . Renal disorder renal failure  . S/P minimally-invasive mitral valve replacement with bioprosthetic valve 10/14/2020   31 mm Medtronic Mosaic stented porcine bioprosthetic tissue valve via right mini thoracotomy approach  . Sleep apnea   . Systolic and diastolic CHF, 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[place]) 04/19/2013   Social History   Socioeconomic History  . Marital status: Single    Spouse name: Not on file  . Number of children: Not on file  . Years of education: Not on file  . Highest education level: Not on file  Occupational History  . Not on file  Tobacco Use  . Smoking status: Former Smoker    Packs/day: 0.20    Types: Cigarettes    Quit date: 10/05/2017    Years since quitting: 3.2  . Smokeless tobacco: Never Used  . Tobacco comment: Quit 3 years ago  Vaping Use  . Vaping Use: Never used  Substance and Sexual Activity  . Alcohol use: No  . Drug use: No    Comment: Quit  . Sexual activity: Not Currently    Birth control/protection: Condom  Other Topics Concern  . Not on file  Social History Narrative   ** Merged History Encounter **        Social Determinants of Health   Financial Resource Strain: Not on file  Food Insecurity: Not on file  Transportation Needs: Not on file  Physical Activity: Not on file  Stress: Not on file  Social Connections: Not on file   Family History  Problem Relation Age of Onset  . Hypertension Mother   . Cancer Mother        breast   Scheduled Meds: . (feeding supplement) PROSource Plus  30 mL Oral BID BM  . Chlorhexidine Gluconate Cloth  6 each Topical Q0600  . cinacalcet  60 mg Oral Q supper  . diltiazem  30 mg Oral Q6H  . docusate sodium  100 mg Oral BID  . feeding supplement (NEPRO CARB STEADY)  237 mL Oral TID BM  . methocarbamol  500 mg Oral TID  . multivitamin with minerals  1 tablet Oral Daily  . oxyCODONE  15 mg Oral Q12H  . pantoprazole  40 mg Oral Daily  . pregabalin  75 mg Oral QHS  . senna-docusate  1 tablet Oral BID  . sodium chloride flush  3 mL Intravenous Q12H  Continuous Infusions: . [START ON 12/28/2020] sodium chloride    . [START ON 12/28/2020] sodium chloride    . gentamicin    . vancomycin 750 mg (12/27/20 1015)   PRN Meds:.[START ON 12/28/2020] sodium chloride, [START ON 12/28/2020] sodium chloride, acetaminophen **OR** acetaminophen, albuterol, [START ON 12/28/2020] alteplase, bisacodyl, [START ON 12/28/2020] heparin, [START ON 12/28/2020] lidocaine (PF), [START ON 12/28/2020] lidocaine-prilocaine, ondansetron **OR** ondansetron (ZOFRAN) IV, [START ON 12/28/2020] pentafluoroprop-tetrafluoroeth, polyethylene glycol Medications Prior to Admission:  Prior to Admission medications   Medication Sig Start Date End Date Taking? Authorizing Provider  acetaminophen (TYLENOL) 325 MG tablet Take 650 mg by mouth every 6 (six) hours as needed for mild pain. Temp > 100.5   Yes [provider]  aspirin EC 325 MG EC tablet Take 1 tablet (325 mg total) by mouth daily. 11/10/20  Yes Samella Parr, NP  bisacodyl (DULCOLAX) 10 MG suppository Place 1 suppository (10 mg  total) rectally daily as needed for moderate constipation. 11/09/20  Yes Samella Parr, NP  calcium acetate (PHOSLO) 667 MG capsule Take 1 capsule (667 mg total) by mouth 3 (three) times daily with meals. 11/09/20  Yes Samella Parr, NP  cinacalcet (SENSIPAR) 30 MG tablet Take 2 tablets (60 mg total) by mouth daily with breakfast. 11/10/20  Yes Samella Parr, NP  cloNIDine (CATAPRES) 0.2 MG tablet Take 0.2 mg by mouth 2 (two) times daily.   Yes [provider]  docusate sodium (COLACE) 100 MG capsule Take 1 capsule (100 mg total) by mouth 2 (two) times daily. 11/09/20  Yes Samella Parr, NP  HYDROmorphone (DILAUDID) 2 MG tablet Take 1 tablet (2 mg total) by mouth every 4 (four) hours as needed for severe pain (Not controlled by oxycontin). 11/09/20  Yes Samella Parr, NP  methocarbamol (ROBAXIN) 500 MG tablet Take 1 tablet (500 mg total) by mouth 3 (three) times daily. 11/09/20  Yes Samella Parr, NP  metoprolol tartrate (LOPRESSOR) 50 MG tablet Take 1 tablet (50 mg total) by mouth 2 (two) times daily. 11/09/20  Yes Samella Parr, NP  Multiple Vitamin (MULTIVITAMIN WITH MINERALS) TABS tablet Take 1 tablet by mouth daily.   Yes [provider]  nitroGLYCERIN (NITROSTAT) 0.4 MG SL tablet Place 0.4 mg under the tongue every 5 (five) minutes x 3 doses as needed for chest pain.    Yes [provider]  Nutritional Supplements (,FEEDING SUPPLEMENT, PROSOURCE PLUS) liquid Take 30 mLs by mouth 2 (two) times daily between meals. 11/09/20  Yes Samella Parr, NP  Nutritional Supplements (FEEDING SUPPLEMENT, NEPRO CARB STEADY,) LIQD Take 237 mLs by mouth 3 (three) times daily between meals. 11/09/20  Yes Samella Parr, NP  oxyCODONE (OXYCONTIN) 15 mg 12 hr tablet Take 1 tablet (15 mg total) by mouth every 12 (twelve) hours. 11/09/20  Yes Samella Parr, NP  pantoprazole (PROTONIX) 40 MG tablet Take 1 tablet (40 mg total) by mouth daily. 11/10/20  Yes Samella Parr, NP   polyethylene glycol (MIRALAX / GLYCOLAX) 17 g packet Take 17 g by mouth daily as needed for mild constipation. 11/09/20  Yes Samella Parr, NP  pregabalin (LYRICA) 75 MG capsule Take 1 capsule (75 mg total) by mouth at bedtime. 11/09/20  Yes Samella Parr, NP  senna-docusate (SENOKOT-S) 8.6-50 MG tablet Take 1 tablet by mouth 2 (two) times daily. 11/09/20  Yes Samella Parr, NP  sevelamer carbonate (RENVELA) 800 MG tablet Take 1,600 mg by mouth 3 (  three) times daily with meals.   Yes [provider]  vancomycin 750 mg in sodium chloride 0.9 % 150 mL Inject 750 mg into the vein every Monday, Wednesday, and Friday with hemodialysis.   Yes [provider]  Darbepoetin Alfa (ARANESP, ALBUMIN FREE, IJ) Darbepoetin Alfa (Aranesp) 07/05/20 07/04/21  [provider]   No Known Allergies Review of Systems  Physical Exam  Vital Signs: BP (!) 138/116 (BP Location: Right Arm)   Pulse (!) 109   Temp 97.9 F (36.6 C)   Resp 14   Ht 6' (1.829 m)   Wt 74.9 kg   SpO2 97%   BMI 22.39 kg/m  Pain Scale: 0-10   Pain Score: 0-No pain   SpO2: SpO2: 97 % O2 Device:SpO2: 97 % O2 Flow Rate: .O2 Flow Rate (L/min): 2 L/min  IO: Intake/output summary:   Intake/Output Summary (Last 24 hours) at 12/27/2020 1118 Last data filed at 12/26/2020 2300 Gross per 24 hour  Intake 522.86 ml  Output -  Net 522.86 ml    LBM: Last BM Date: 12/25/20 Baseline Weight: Weight:  (unable to weigh, in ER stretcher) Most recent weight: Weight: 74.9 kg     Palliative Assessment/Data:     Time In: *** Time Out: *** Time Total: *** Greater than 50%  of this time was spent counseling and coordinating care related to the above assessment and plan.  Signed by: Wadie Lessen, NP   Please contact Palliative Medicine Team phone at [phone] for questions and concerns.  For individual provider: See Shea Evans

## 2020-12-27 NOTE — Progress Notes (Signed)
PROGRESS NOTE    Patrick Anthony  [id number] DOB: January 19, 1971 DOA: 12/22/2020 PCP: [name], MD   Brief Narrative:  The patient is a 9 atrial African-American male with a past medical history significant for but not limited to end-stage renal disease on hemodialysis on Monday Wednesday Friday, chronic diastolic CHF, hyperlipidemia, history of liver cirrhosis secondary to hepatitis C, history of alcohol abuse, OSA as well as recent AKA done by Dr. Sharol Given on 10/29/2020 and other comorbidities who presented with a chief complaint of spiking fevers for last few days and then altered mental status.  Reportedly patient was at his facility when they were noticing that he was altered and history is difficult to be obtained from the patient himself.  Of note he has had a prolonged hospitalization from 09/27/20 until November 11, 2019 he was found to have MRSA bacteremia as well as mitral valve endocarditis with PFO and septic emboli to the brain along with that time.  He underwent a bioprosthetic mitral valve replacement along with closure of the PFO on 10/14/2020.  Also during that hospitalization he was treated for osteomyelitis and abscess of the right tibia status post AKA on 10/29/2020 by Dr. Sharol Given.  He was discharged to [facility] for continuation of vancomycin at the nursing facility until 11/25/2020.  Reportedly he is usually alert and oriented x4.  Yesterday he is only alert and oriented to person and place but not the situation.  He complained about severe pain in his back has been present for some time and he describes shooting pains in the right leg but was amputated.  He denies any shortness breath or cough.  In the ED he presented with a fever of 103 and rales were reported in the lower lung field on the right lung.  Upon arrival to the ED he was noted to be septic given that he had an elevated temperature, respirations as well as being tachycardia and had a new O2 requirement as well as being altered.   CT scan of the brain showed no acute abnormalities but did show several changes that were chronic injury to the areas of infarct noted on the previous MRI.  His WBC was elevated 20,200.  Chest x-ray showed scarring in the right upper lobe, middle lobe atelectasis and a small left-sided pleural effusion stable cardiomegaly.  Initial sepsis protocol was initiated and he was given IV vancomycin and cefepime.  Further work-up reveals that he has another MRSA bacteremia and infectious diseases is involved again and nephrology is involved as well.  Patient is to get a repeat TEE and this was done today 12/24/20 to evaluate his mitral valve. Still pending to get a MRI the T-spine with and without contrast with hemodialysis after.  UnFortunately a TEE shows likely bioprosthetic valve infection so infectious disease is now going to add gentamicin and rifampin once his cultures are cleared.  We will still pursue his thoracic spine MRI which is still pending to be done.  Overnight the patient developed blurred vision and he underwent a head MRI which showed recurrent cardioembolic strokes in the setting of septic emboli from infective endocarditis.  Neurology was consulted and they did not recommend further work-up given that he recently just had an MRI of the brain with and without contrast on 09/28/2020 as well as an echo and carotid Dopplers.  Neurology recommending holding antiplatelets given the risk of hemorrhagic conversion of recurrent infective endocarditis related strokes and resume in 1 week if patient remains stable.  Patient's aspirin was then discontinued.  MRI of the spine shows early discitis-osteomyelitis at the T10-T11 level with mild perivertebral soft tissue edema at this level and no evidence of epidural fluid collection or abscess.  There is also moderate endplate marrow edema centered at C6-7 intervertebral disc space similar to previous MRI and small bilateral pleural effusion.  Cardiothoracic  surgery evaluated and they feel that he is a candidate for redo mitral valve replacement and aortic valve replacement 3 months following previous surgery as they do not feel that he would survive this.  They recommend continuing antibiotic therapy.  On the morning of 12/26/20 he went into SVT and heart rates spiked in the 150s and he was given at 250 mL bolus, tried vagal maneuvers which did not break his rhythm and he was given Tylenol for his temperature of 102.8.  Cardiology was consulted and he was given adenosine 6 mg and then it looked like 2-1 flutter and he was placed on a diltiazem drip and bolus.  Cardiology feels that he cannot be anticoagulated this time given his risk for hemorrhagic conversion.  Further review of telemetry showed that his heart rate may just represent sinus tachycardia and as there is no clear flutter waves and now cardiology transition him to oral diltiazem  Assessment & Plan:   Principal Problem:   Bacteremia due to Staphylococcus aureus Active Problems:   Anemia in chronic kidney disease   Hepatitis C   ESRD on dialysis (HCC)   S/P mitral valve replacement   S/P AKA (above knee amputation), right (HCC)   Personal history of Methicillin resistant Staphylococcus aureus infection   SIRS (systemic inflammatory response syndrome) (HCC)  Severe sepsis secondary to an MRSA bacteremia and Prostetic Valve Endocarditis present on admission -Presented the fever up to 103 F, tachycardia, tachypnea, a new oxygen requirement, altered mental status as well as a leukocytosis of 20,200 and also with hypotension -Lactic acid level was reassuring at 1.7 on admission -Chest x-ray on admission only noted low lung volumes and possible signs of atelectasis with no clear source of infection appreciated at that time -Blood cultures now revealing an MRSA bacteremia; Repeat Blood Cx showed no growth to date but will obtain another set today given that he became febrile and  tachycardic -He has been admitted progressive bed -He was initiated on IV antibiotics with vancomycin and cefepime but cefepime has now been discontinued -Infectious diseases has been consulted and plan further work-up for the patient and have ordered a repeat TEE and discussed with cardiology given his prostatic mitral valve and also will be getting a T-spine MRI with and without contrast with dialysis to be done afterwards; MRI done and showed that he has early changes of discitis and osteomyelitis at the T10-T11 level -Patient did get TEE which showed "Possible small vegetations associated with the bioprosthetic mitral valve and native aortic valve. No LAA thrombus. Negative for PFO. Moderate LVH Severe RA/RV dilitation with mild TR LVEF 60-65%." -We will be continuing vancomycin but because of Concern for possible Valvular Vegatations including a small, moblile Filamentous Structure on the posterior bioprosthetic vavlve leaflet was concerning for vegetation, ID now recommending PVE Coverage and will start Gentamicin and Add Rifampin once CX's Clear -procalcitonin level was 28.50 -> 28.84 -Repeat blood cultures have been planned for 12/24/2020 -Patient continues to have persistent sepsis physiology as he continues to spike temperatures and becomes tachycardic and tachypneic in the setting of his bacteremia -Appreciate further ID evaluation recommendations; sepsis physiology was  improving but patient continues to still spike Temperatures and becomes intermittently tachycardic and tachypenic  -Patient's WBC minimally improved and went from 20.2 -> 19.7 -> 21.1 -> 28.9 -> 23.8 -> 20.4 -Now infectious diseases recommending referral to cardiothoracic surgery and continuing gentamicin and vancomycin and adding rifampin later once blood cultures remain negative; cardiothoracic surgery feels that he is not a candidate for redoing his surgical intervention -Repeat Blood Cx's from 12/26/20 POSITIVE AGAIN but the  ones drawn a few days ago showed no growth to date -Infectious diseases to repeat blood cultures on 12/27/2020 -Given his high risk for further decompensation and poor prognosis we will consult palliative care for goals of care discussion -C/w Supportive Care and infectious diseases anticipates adding rifampin tomorrow for triple therapy  Acute CVAs -Patient has recurrent cardioembolic strokes in the setting of septic emboli from infective endocarditis -MRI of the brain overnight done and showed "Numerous punctate foci of abnormal diffusion restriction within both hemispheres in multiple vascular territories, consistent with acute infarcts.  Multiple scattered microhemorrhages in a nonspecific pattern." -Neurology was consulted and patient already had most of the stroke work-up done in the last few admissions and they did not recommend repeating echocardiogram as he had a TEE 520 confirming infective endocarditis -Neurology recommended holding antiplatelets at the time given risk of hemorrhagic conversion -DVT prophylaxis changed from heparin to SCDs and recommending resuming in 1 week -Neurology recommended continue telemetry monitoring and a 30-day event monitor on discharge if no arrhythmias were captured -They recommended blood pressure goal is normotensive and recommending and PT OT and SLP evaluation -Troponin was elevated and went to 774 and likely in the setting of demand ischemia in the setting of his acute illness with bacteremia and tachyarrhythmias; he did have normal coronary arteries in 10/2020 -We will need to continue monitor patient carefully  Arrhythmia/new onset SVT -Patient was found to be in SVT this morning and tried vagal maneuvers without success He was given a beta-blocker early this morning as well with out any improvement.  He is also given 250 normal saline bolus -Cardiology was consulted and troponins were obtained and his troponin level was 613 and trended up to  774 -Cardiology evaluated and gave the patient 6 mg of adenosine which slowed his rhythm and it looked like a 2-1 a flutter block so they started him on diltiazem drip -Once his arrhythmia improved and his heart rate slowed down cardiology reviewed his telemetry and they felt that it was not a flutter and may just represent sinus tachycardia so the will transition to oral diltiazem. -Cardiology following appreciate evaluation and Dr. Gardiner Rhyme evaluated today and patient is currently in sinus tachycardia with rates in the 100 and has been switched to p.o. diltiazem 30 mg 4 times daily -It is unclear if he did have atrial flutter but he is not a good candidate for anticoagulation given his multiple cardioembolic events from his infective endocarditis.  Hypotension -In the setting of MRSA bacteremia his admission blood pressures were as low as 84/51 but have now improved -Improved with IV fluid resuscitation -Continue to monitor blood pressures per protocol; blood pressures are still on the softer side but last blood pressure reading was 148/100 -His home blood pressure medications include clonidine 0.2 mg p.o. twice daily as well as metoprolol tartrate 50 mg p.o. twice daily which have both been held -Goal MAP bolus of at least 65 -Continue with IV fluid hydration as needed but will need to be judicious given that he  is a dialysis patient  Acute metabolic encephalopathy in the setting of infection, improved  -He is noted to be alert and oriented x2 but was previously alert and oriented x4 -CT scan did not show any acute abnormalities; he does have a prior history of mitral valve endocarditis with septic emboli to the brain and to the lung -He appears more appropriate today and appears closer to his baseline so we will hold off further investigation -Continue with neurochecks per protocol -See Above   ESRD on Hemodialysis Monday Wednesday  Secondary hyperparathyroidism hypokalemia -Patient has  a history of poorly cystic kidney disease Normally dialyzes Monday Wednesday Friday -He was dialyzed following contrast yesterday and will be dialyzed again tomorrow after the MRI is done -Nephrology been consulted and appreciate further evaluation recommendations and they are planning for dialysis tomorrow first shift and he went early this AM -Patient's BUNs/creatinine went from 56/8.20 -> 29/4.91 -> 43/6.91 -> 26/4.78 and further worsened to 37/5.95 and today it is 48/7.45 -Given that patient's calcium is at goal and his phosphorus is low they will discontinue binders but they recommended continue Cinacalcet 60 mg p.o. 4 times daily; will discontinue sevelamer carbonate 600 g p.o. 3 times daily -Patient's potassium was 3.4 yesterday and 3.3 today and expect to be corrected in dialysis -Phos level is very low at 1.5 and will defer to Nephrology to replete  -Nephrology to dialyze the patient next on 12/27/2020 and the plan is for 4K 2.25 calcium left upper arm bath with 3.5 L net ultrafiltration  Hyponatremia -Mild. Na+ Went from 135 -> 133 -> 137 -> 133 -> 132 -Expect to be corrected in Dialysis today -Continue to Monitor and Trend -Repeat CMP in the AM   Acute Respiratory Failure with Hypoxia, improved  -Was placed on 2 L supplemental oxygen via nasal cannula and does not wear supplemental oxygen at home -Chest x-ray yesterday showed "Lower lung volumes with atelectasis. Chronic cardiomegaly and right lung  Scarring." -Patient does have a history of MRSA bacteremia with septic emboli to the lungs -SpO2: 95 % O2 Flow Rate (L/min): 2 L/min -Continuous pulse oximetry maintain O2 saturations greater than 90% Continue supplemental oxygen via nasal cannula and wean O2 as tolerated -We will need a repeat chest x-ray and an ambulatory home O2 screen prior to discharge  History of MRSA bacteremia with mitral valve endocarditis and PFO and septic emboli to the brain and left lung -He previously  completed antibiotics on 11/25/2020 but now is back with another bacteremia as above so is now going to be on IV Vancomycin, IV Gentamicin, and IV Rifampin once Cx's clear   Anemia of chronic kidney disease -Patient presented with a hemoglobin 9.4 g/dL which appears near his baseline; his hemoglobin/hematocrit yesterday is stable at 9.7/32.3 and today it is 9.2/30.7 -check anemia panel in the a.m. -Continue to monitor for signs and symptoms of bleeding; no overt bleeding noted -Repeat CBC in the a.m.  Hypokalemia -Mild -The patient is potassium went from 4.4 and is now 3.4 -Defer to nephrology to replete  -Continue monitor and replete as necessary and check magnesium level in the a.m. -Repeat CMP in the a.m.  Chronic Pain Syndrome with Chronic Opiate Dependence -He reports chronic back pain as well as right lower extremity that was recently amputated -Resuming home regimen of OxyContin and hydromorphone as needed however given his back pain will need to rule out infection etiology and he will undergo an MRI with and without contrast in the morning  Status post right AKA -Recently had an AKA done in the last 2 months -Continue with Pregabalin 75 mg p.o. nightly  Liver cirrhosis secondary to chronic hepatitis C -His HCC quantitative was 1,750,000 on 09/29/2020 -Infectious diseases been consulted given his MRSA bacteremia again and appreciate further evaluation recommendations.  GERD -Continue with PPI with pantoprazole 40 mg p.o. daily  Hyperbilirubinemia -Patient's T Bili went from 1.1 -> 1.5 -Continue to Monitor and Trend -Repeat CMP in the AM   DVT prophylaxis: Heparin 5000 units subcu every 8 Code Status: FULL CODE  Family Communication: No family present at bedside but spoke to Daughter [place] via telephone Disposition Plan: Pending further clinical work-up and evaluation by infectious diseases as well as nephrology, Neurology, cardiology and TCTS  Status is:  Inpatient  Remains inpatient appropriate because:Unsafe d/c plan, IV treatments appropriate due to intensity of illness or inability to take PO and Inpatient level of care appropriate due to severity of illness   Dispo: The patient is from: Home              Anticipated d/c is to: Home              Patient currently is not medically stable to d/c.   Difficult to place patient No  Consultants:   Infectious Diseases  Nephrology  Cardiology   Neurology  Cardiothoracic Surgery  Palliative Care   Procedures:  TEE done by Dr. Debara Pickett  Findings:  1. LEFT VENTRICLE: The left ventricular wall thickness is moderately increased.  The left ventricular cavity is normal in size. Wall motion is normal.  LVEF is 60-65%.  2. RIGHT VENTRICLE:  The right ventricle is severely dilated without any thrombus or masses.    3. LEFT ATRIUM:  The left atrium is normal in size without any thrombus or masses.  There is not spontaneous echo contrast ("smoke") in the left atrium consistent with a low flow state.  4. LEFT ATRIAL APPENDAGE:  The large left atrial appendage is free of any thrombus or masses. The appendage has single lobes. Pulse doppler indicates high flow in the appendage.  5. ATRIAL SEPTUM:  The atrial septum appears intact and is free of thrombus and/or masses.  There is no evidence for interatrial shunting by color doppler and saline microbubble.  6. RIGHT ATRIUM:  The right atrium is severely dilated in size and function without any thrombus or masses.  7. 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 There is  trivial regurgitation.  There is a possible small mobile vegetation adherent to the posterior leaflet which appears filamentous, this could also be residual valve tissue or suture.   8. AORTIC VALVE:  The aortic valve is trileaflet, normal in structure and function with no regurgitation.  The valve was visualized with 2D/3D modes - there  appears to be a small mobile density at the base of the left coronary cusp seen in the long-axis view on the outflow side of the leaflet, possibly consistent with vegetation.  9. TRICUSPID VALVE:  The tricuspid valve is normal in structure and function with Mild regurgitation.  There were no vegetations or stenosis  10.  PULMONIC VALVE:  The pulmonic valve is normal in structure and function with trivial regurgitation.  There were no vegetations or stenosis.   11. AORTIC ARCH, ASCENDING AND DESCENDING AORTA:  There was grade 2 Ron Parker et. Al, 1992) atherosclerosis of the ascending aorta, aortic arch, or proximal descending aorta.  12. PULMONARY  VEINS: Anomalous pulmonary venous return was not noted.  13. PERICARDIUM: The pericardium appeared normal and non-thickened.  There is no pericardial effusion.  IMPRESSION:   1. 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  2. No LAA thrombus 3. Negative for PFO 4. Moderate LVH 5. Severe RA/RV dilitation with mild TR 6. LVEF 60-65%  RECOMMENDATIONS:    1. Consider longer course of antibiotics for possible valvular vegetations, including a small, mobile filamentous structure on the posterior bioprosthetic valve leaflet which is concerning for vegetation.  Antimicrobials:  Anti-infectives (From admission, onward)   Start     Dose/Rate Route Frequency Ordered Stop   12/27/20 1800  gentamicin (GARAMYCIN) IVPB 80 mg        80 mg 100 mL/hr over 30 Minutes Intravenous Every M-W-F (1800) 12/24/20 1612     12/27/20 0813  vancomycin (VANCOREADY) 750 MG/150ML IVPB  Status:  Discontinued       Note to Pharmacy: Louretta Shorten   : cabinet override      12/27/20 0813 12/27/20 0815   12/24/20 1700  gentamicin (GARAMYCIN) IVPB 120 mg        120 mg 200 mL/hr over 30 Minutes Intravenous  Once 12/24/20 1612 12/24/20 1910   12/24/20 1430  vancomycin (VANCOCIN) IVPB 750 mg/150 ml premix        750  mg 150 mL/hr over 60 Minutes Intravenous Every M-W-F (Hemodialysis) 12/24/20 1324     12/22/20 1201  vancomycin variable dose per unstable renal function (pharmacist dosing)  Status:  Discontinued         Does not apply See admin instructions 12/22/20 1202 12/24/20 1324   12/22/20 1200  vancomycin (VANCOREADY) IVPB 750 mg/150 mL  Status:  Discontinued        750 mg 150 mL/hr over 60 Minutes Intravenous Every M-W-F (Hemodialysis) 12/22/20 0845 12/22/20 1202   12/22/20 1200  ceFEPIme (MAXIPIME) 2 g in sodium chloride 0.9 % 100 mL IVPB  Status:  Discontinued        2 g 200 mL/hr over 30 Minutes Intravenous Every M-W-F (Hemodialysis) 12/22/20 0845 12/23/20 1450   12/22/20 0715  vancomycin (VANCOREADY) IVPB 1500 mg/300 mL        1,500 mg 150 mL/hr over 120 Minutes Intravenous  Once 12/22/20 0714 12/22/20 1034   12/22/20 0715  ceFEPIme (MAXIPIME) 2 g in sodium chloride 0.9 % 100 mL IVPB        2 g 200 mL/hr over 30 Minutes Intravenous  Once 12/22/20 0714 12/22/20 0806       Subjective: Seen and examined at bedside and and he was in dialysis and felt better today than he did yesterday.  No nausea or vomiting.  Denies any pain.  Subsequently he became febrile and tachycardic again in the afternoon.  Blood cultures are again positive from yesterday so will be repeated again today.  He denies any other concerns or complaints at this time.  Objective: Vitals:   12/27/20 1423 12/27/20 1539 12/27/20 1635 12/27/20 1724  BP: (!) 144/91 (!) 138/94 (!) 122/93 (!) 145/90  Pulse: (!) 124 (!) 112 (!) 113 (!) 110  Resp: '20 17 17 '$ (!) 22  Temp: (!) 102.9 F (39.4 C) 99.4 F (37.4 C) 98.8 F (37.1 C) 98.3 F (36.8 C)  TempSrc: Oral Oral Oral Oral  SpO2: 96% 91% 95% 95%  Weight:      Height:        Intake/Output Summary (Last 24 hours) at  12/27/2020 1746 Last data filed at 12/27/2020 1125 Gross per 24 hour  Intake 300 ml  Output 3500 ml  Net -3200 ml   Filed Weights   12/27/20 0425 12/27/20 0720  12/27/20 1125  Weight: 76.2 kg 74.9 kg 71.4 kg   Examination: Physical Exam:  Constitutional: Chronically ill-appearing African-American male currently in no acute distress getting dialyzed  Eyes: Lids and conjunctivae normal, sclerae anicteric  ENMT: External Ears, Nose appear normal. Grossly normal hearing.  Neck: Appears normal, supple, no cervical masses, normal ROM, no appreciable thyromegaly; no JVD Respiratory: Diminished to auscultation bilaterally with coarse breath sounds, no wheezing, rales, rhonchi or crackles. Normal respiratory effort and patient is not tachypenic. No accessory muscle use.  Cardiovascular: Tachycardic rate but appears to be regular rhythm, no murmurs / rubs / gallops. S1 and S2 auscultated.  Has mild lower extremity edema in the left leg. Abdomen: Soft, non-tender, distended secondary body habitus. Bowel sounds positive.  GU: Deferred. Musculoskeletal: No clubbing / cyanosis of digits/nails.  Has a right AKA Skin: No rashes, lesions, ulcers on limited skin evaluation. No induration; Warm and dry.  Neurologic: CN 2-12 grossly intact with no focal deficits. Romberg sign and cerebellar reflexes not assessed.  Psychiatric: Normal judgment and insight. Alert and oriented x 3. Normal mood and appropriate affect.   Data Reviewed: I have personally reviewed following labs and imaging studies  CBC: Recent Labs  Lab 12/22/20 0714 12/23/20 0119 12/24/20 0347 12/25/20 0056 12/26/20 0117 12/27/20 0218  WBC 20.2* 19.7* 21.1* 28.9* 23.8* 20.4*  NEUTROABS 18.0*  --  16.9* 23.7* 19.2* 15.3*  HGB 9.4* 9.3* 9.7* 10.0* 9.7* 9.2*  HCT 32.0* 31.0* 32.0* 32.8* 32.3* 30.7*  MCV 82.5 80.7 80.8 80.6 79.2* 78.9*  PLT 215 228 230 262 315 [id number]   Basic Metabolic Panel: Recent Labs  Lab 12/23/20 0119 12/24/20 0347 12/25/20 0056 12/26/20 0117 12/27/20 0218  NA 135 133*  133* 137 133* 132*  K 3.4* 3.9  3.9 3.3* 3.4* 3.3*  CL 95* 92*  92* 98 95* 95*  CO2 '25 27  27 24  26 22  '$ GLUCOSE 66* 105*  106* 90 107* 96  BUN 29* 43*  42* 26* 37* 48*  CREATININE 4.91* 6.91*  6.83* 4.78* 5.95* 7.45*  CALCIUM 8.4* 9.1  9.2 8.7* 9.2 9.4  MG  --  1.9 2.0 2.2 2.3  PHOS 1.8* 1.8*  1.8* 1.5* 2.4* 3.0   GFR: Estimated Creatinine Clearance: 12.1 mL/min (A) (by C-G formula based on SCr of 7.45 mg/dL (H)). Liver Function Tests: Recent Labs  Lab 12/22/20 0714 12/23/20 0119 12/24/20 0347 12/25/20 0056 12/26/20 0117 12/27/20 0218  AST 20  --  '21 22 25 22  '$ ALT 10  --  '10 10 9 10  '$ ALKPHOS 74  --  79 81 76 71  BILITOT 1.0  --  1.0 1.2 1.1 1.5*  PROT 8.4*  --  8.6* 8.5* 8.4* 8.7*  ALBUMIN 2.4* 2.2* 2.2*  2.2* 2.2* 2.2* 2.1*   No results for input(s): LIPASE, AMYLASE in the last 168 hours. No results for input(s): AMMONIA in the last 168 hours. Coagulation Profile: Recent Labs  Lab 12/22/20 0714  INR 1.3*   Cardiac Enzymes: No results for input(s): CKTOTAL, CKMB, CKMBINDEX, TROPONINI in the last 168 hours. BNP (last 3 results) No results for input(s): PROBNP in the last 8760 hours. HbA1C: No results for input(s): HGBA1C in the last 72 hours. CBG: Recent Labs  Lab 12/22/20 1402 12/22/20 1804  GLUCAP 83 82   Lipid Profile: No results for input(s): CHOL, HDL, LDLCALC, TRIG, CHOLHDL, LDLDIRECT in the last 72 hours. Thyroid Function Tests: No results for input(s): TSH, T4TOTAL, FREET4, T3FREE, THYROIDAB in the last 72 hours. Anemia Panel: No results for input(s): VITAMINB12, FOLATE, FERRITIN, TIBC, IRON, RETICCTPCT in the last 72 hours. Sepsis Labs: Recent Labs  Lab 12/22/20 0714 12/26/20 1019 12/27/20 0218  PROCALCITON  --  28.50 28.84  LATICACIDVEN 1.7  --   --     Recent Results (from the past 240 hour(s))  Resp Panel by RT-PCR (Flu A&B, Covid) Nasopharyngeal Swab     Status: None   Collection Time: 12/22/20  7:14 AM   Specimen: Nasopharyngeal Swab; Nasopharyngeal(NP) swabs in vial transport medium  Result Value Ref Range Status   SARS  Coronavirus 2 by RT PCR NEGATIVE NEGATIVE Final    Comment: (NOTE) SARS-CoV-2 target nucleic acids are NOT DETECTED.  The SARS-CoV-2 RNA is generally detectable in upper respiratory specimens during the acute phase of infection. The lowest concentration of SARS-CoV-2 viral copies this assay can detect is 138 copies/mL. A negative result does not preclude SARS-Cov-2 infection and should not be used as the sole basis for treatment or other patient management decisions. A negative result may occur with  improper specimen collection/handling, submission of specimen other than nasopharyngeal swab, presence of viral mutation(s) within the areas targeted by this assay, and inadequate number of viral copies(<138 copies/mL). A negative result must be combined with clinical observations, patient history, and epidemiological information. The expected result is Negative.  Fact Sheet for Patients:  EntrepreneurPulse.com.au  Fact Sheet for Healthcare Providers:  IncredibleEmployment.be  This test is no t yet approved or cleared by the [place] FDA and  has been authorized for detection and/or diagnosis of SARS-CoV-2 by FDA under an Emergency Use Authorization (EUA). This EUA will remain  in effect (meaning this test can be used) for the duration of the COVID-19 declaration under Section 564(b)(1) of the Act, 21 U.S.C.section 360bbb-3(b)(1), unless the authorization is terminated  or revoked sooner.       Influenza A by PCR NEGATIVE NEGATIVE Final   Influenza B by PCR NEGATIVE NEGATIVE Final    Comment: (NOTE) The Xpert Xpress SARS-CoV-2/FLU/RSV plus assay is intended as an aid in the diagnosis of influenza from Nasopharyngeal swab specimens and should not be used as a sole basis for treatment. Nasal washings and aspirates are unacceptable for Xpert Xpress SARS-CoV-2/FLU/RSV testing.  Fact Sheet for  Patients: EntrepreneurPulse.com.au  Fact Sheet for Healthcare Providers: IncredibleEmployment.be  This test is not yet approved or cleared by the [place] FDA and has been authorized for detection and/or diagnosis of SARS-CoV-2 by FDA under an Emergency Use Authorization (EUA). This EUA will remain in effect (meaning this test can be used) for the duration of the COVID-19 declaration under Section 564(b)(1) of the Act, 21 U.S.C. section 360bbb-3(b)(1), unless the authorization is terminated or revoked.  Performed at [facility], [place] [address] [place],  [address]   Blood Culture (routine x 2)     Status: Abnormal   Collection Time: 12/22/20  7:14 AM   Specimen: BLOOD  Result Value Ref Range Status   Specimen Description BLOOD SITE NOT SPECIFIED  Final   Special Requests   Final    BOTTLES DRAWN AEROBIC AND ANAEROBIC Blood Culture adequate volume   Culture  Setup Time   Final    GRAM POSITIVE COCCI IN CLUSTERS IN BOTH AEROBIC  AND ANAEROBIC BOTTLES CRITICAL RESULT CALLED TO, READ BACK BY AND VERIFIED WITHDion Body [facility] 2028 12/22/20 A BROWNING Performed at [facility], [place] [address], [place], [place] [address]    Culture METHICILLIN RESISTANT STAPHYLOCOCCUS AUREUS (A)  Final   Report Status 12/24/2020 FINAL  Final   Organism ID, Bacteria METHICILLIN RESISTANT STAPHYLOCOCCUS AUREUS  Final      Susceptibility   Methicillin resistant staphylococcus aureus - MIC*    CIPROFLOXACIN 4 RESISTANT Resistant     ERYTHROMYCIN >=8 RESISTANT Resistant     GENTAMICIN <=0.5 SENSITIVE Sensitive     OXACILLIN >=4 RESISTANT Resistant     TETRACYCLINE <=1 SENSITIVE Sensitive     VANCOMYCIN 1 SENSITIVE Sensitive     TRIMETH/SULFA <=10 SENSITIVE Sensitive     CLINDAMYCIN <=0.25 SENSITIVE Sensitive     RIFAMPIN <=0.5 SENSITIVE Sensitive     Inducible Clindamycin NEGATIVE Sensitive     * METHICILLIN RESISTANT STAPHYLOCOCCUS AUREUS   Blood Culture ID Panel (Reflexed)     Status: Abnormal   Collection Time: 12/22/20  7:14 AM  Result Value Ref Range Status   Enterococcus faecalis NOT DETECTED NOT DETECTED Final   Enterococcus Faecium NOT DETECTED NOT DETECTED Final   Listeria monocytogenes NOT DETECTED NOT DETECTED Final   Staphylococcus species DETECTED (A) NOT DETECTED Final    Comment: CRITICAL RESULT CALLED TO, READ BACK BY AND VERIFIED WITH: J MILLEN PHARMD 2028 12/22/20 A BROWNING    Staphylococcus aureus (BCID) DETECTED (A) NOT DETECTED Final    Comment: Methicillin (oxacillin)-resistant Staphylococcus aureus (MRSA). MRSA is predictably resistant to beta-lactam antibiotics (except ceftaroline). Preferred therapy is vancomycin unless clinically contraindicated. Patient requires contact precautions if  hospitalized. CRITICAL RESULT CALLED TO, READ BACK BY AND VERIFIED WITH: Dion Body PHARMD 2028 12/22/20 A BROWNING    Staphylococcus epidermidis NOT DETECTED NOT DETECTED Final   Staphylococcus lugdunensis NOT DETECTED NOT DETECTED Final   Streptococcus species NOT DETECTED NOT DETECTED Final   Streptococcus agalactiae NOT DETECTED NOT DETECTED Final   Streptococcus pneumoniae NOT DETECTED NOT DETECTED Final   Streptococcus pyogenes NOT DETECTED NOT DETECTED Final   A.calcoaceticus-baumannii NOT DETECTED NOT DETECTED Final   Bacteroides fragilis NOT DETECTED NOT DETECTED Final   Enterobacterales NOT DETECTED NOT DETECTED Final   Enterobacter cloacae complex NOT DETECTED NOT DETECTED Final   Escherichia coli NOT DETECTED NOT DETECTED Final   Klebsiella aerogenes NOT DETECTED NOT DETECTED Final   Klebsiella oxytoca NOT DETECTED NOT DETECTED Final   Klebsiella pneumoniae NOT DETECTED NOT DETECTED Final   Proteus species NOT DETECTED NOT DETECTED Final   Salmonella species NOT DETECTED NOT DETECTED Final   Serratia marcescens NOT DETECTED NOT DETECTED Final   Haemophilus influenzae NOT DETECTED NOT DETECTED Final    Neisseria meningitidis NOT DETECTED NOT DETECTED Final   Pseudomonas aeruginosa NOT DETECTED NOT DETECTED Final   Stenotrophomonas maltophilia NOT DETECTED NOT DETECTED Final   Candida albicans NOT DETECTED NOT DETECTED Final   Candida auris NOT DETECTED NOT DETECTED Final   Candida glabrata NOT DETECTED NOT DETECTED Final   Candida krusei NOT DETECTED NOT DETECTED Final   Candida parapsilosis NOT DETECTED NOT DETECTED Final   Candida tropicalis NOT DETECTED NOT DETECTED Final   Cryptococcus neoformans/gattii NOT DETECTED NOT DETECTED Final   Meth resistant mecA/C and MREJ DETECTED (A) NOT DETECTED Final    Comment: CRITICAL RESULT CALLED TO, READ BACK BY AND VERIFIED WITHDion Body [facility] 2028 12/22/20 A BROWNING Performed at [facility], [address] [place], [place] [address]   Blood Culture (routine x 2)     Status: Abnormal   Collection Time: 12/22/20  7:19 AM   Specimen: BLOOD  Result Value Ref Range Status   Specimen Description BLOOD SITE NOT SPECIFIED  Final   Special Requests   Final    BOTTLES DRAWN AEROBIC AND ANAEROBIC Blood Culture results may not be optimal due to an excessive volume of blood received in culture bottles   Culture  Setup Time   Final    GRAM POSITIVE COCCI IN CLUSTERS IN BOTH AEROBIC AND ANAEROBIC BOTTLES IDENTIFICATION TO FOLLOW CRITICAL RESULT CALLED TO, READ BACK BY AND VERIFIED WITH: J MILLEN PHARMD 2028 12/22/20 A BROWNING    Culture (A)  Final    STAPHYLOCOCCUS AUREUS SUSCEPTIBILITIES PERFORMED ON PREVIOUS CULTURE WITHIN THE LAST 5 DAYS. Performed at [facility], [place] [address] [place], [place] [address]    Report Status 12/24/2020 FINAL  Final  Culture, blood (routine x 2)     Status: None (Preliminary result)   Collection Time: 12/24/20  3:10 PM   Specimen: BLOOD RIGHT HAND  Result Value Ref Range Status   Specimen Description BLOOD RIGHT HAND  Final   Special Requests   Final    BOTTLES DRAWN AEROBIC ONLY Blood Culture  adequate volume   Culture   Final    NO GROWTH 3 DAYS Performed at [facility], [place] [address] [place], [place] [address]    Report Status PENDING  Incomplete  Culture, blood (routine x 2)     Status: None (Preliminary result)   Collection Time: 12/24/20  3:37 PM   Specimen: BLOOD  Result Value Ref Range Status   Specimen Description BLOOD BLOOD RIGHT FOREARM  Final   Special Requests   Final    BOTTLES DRAWN AEROBIC ONLY Blood Culture adequate volume   Culture   Final    NO GROWTH 3 DAYS Performed at [facility], [place] [address], [place], [place] [address]    Report Status PENDING  Incomplete  MRSA PCR Screening     Status: None   Collection Time: 12/25/20 10:18 PM   Specimen: Nasal Mucosa; Nasopharyngeal  Result Value Ref Range Status   MRSA by PCR NEGATIVE NEGATIVE Final    Comment:        The GeneXpert MRSA Assay (FDA approved for NASAL specimens only), is one component of a comprehensive MRSA colonization surveillance program. It is not intended to diagnose MRSA infection nor to guide or monitor treatment for MRSA infections. Performed at [facility], [place] [address] [place], [place] [address]   Culture, blood (routine x 2)     Status: None (Preliminary result)   Collection Time: 12/26/20 10:19 AM   Specimen: BLOOD RIGHT HAND  Result Value Ref Range Status   Specimen Description BLOOD RIGHT HAND  Final   Special Requests   Final    BOTTLES DRAWN AEROBIC ONLY Blood Culture results may not be optimal due to an inadequate volume of blood received in culture bottles   Culture  Setup Time   Final    GRAM POSITIVE COCCI IN CLUSTERS AEROBIC BOTTLE ONLY CRITICAL RESULT CALLED TO, READ BACK BY AND VERIFIED WITH: PHARMD TM TUCKER [id number] AT 717 BY CM Performed at [facility], [place] [address] [place], [place] [address]    Culture GRAM POSITIVE COCCI IN CLUSTERS  Final   Report Status PENDING  Incomplete  Culture, blood (routine x 2)     Status:  None (Preliminary result)   Collection Time: 12/26/20 10:19 AM   Specimen: BLOOD RIGHT WRIST  Result Value Ref Range Status   Specimen Description BLOOD RIGHT WRIST  Final   Special Requests   Final    BOTTLES DRAWN AEROBIC ONLY Blood Culture results may not be optimal due to an inadequate volume of blood received in culture bottles   Culture  Setup Time   Final    GRAM POSITIVE COCCI IN CLUSTERS AEROBIC BOTTLE ONLY CRITICAL RESULT CALLED TO, READ BACK BY AND VERIFIED WITH: Trude Mcburney [id number] AT 717 BY CM Performed at [facility], [place] [address], [place], [place] [address]    Culture GRAM POSITIVE COCCI IN CLUSTERS  Final   Report Status PENDING  Incomplete    RN Pressure Injury Documentation:     Estimated body mass index is 21.35 kg/m as calculated from the following:   Height as of this encounter: 6' (1.829 m).   Weight as of this encounter: 71.4 kg.  Malnutrition Type:   Malnutrition Characteristics:   Nutrition Interventions:   Radiology Studies: No results found. Scheduled Meds: . (feeding supplement) PROSource Plus  30 mL Oral BID BM  . Chlorhexidine Gluconate Cloth  6 each Topical Q0600  . cinacalcet  60 mg Oral Q supper  . diltiazem  30 mg Oral Q6H  . docusate sodium  100 mg Oral BID  . feeding supplement (NEPRO CARB STEADY)  237 mL Oral TID BM  . methocarbamol  500 mg Oral TID  . multivitamin with minerals  1 tablet Oral Daily  . oxyCODONE  15 mg Oral Q12H  . pantoprazole  40 mg Oral Daily  . pregabalin  75 mg Oral QHS  . senna-docusate  1 tablet Oral BID  . sodium chloride flush  3 mL Intravenous Q12H   Continuous Infusions: . gentamicin    . vancomycin Stopped (12/27/20 1214)    LOS: 5 days   Kerney Elbe, DO [facility] PAGER is on [place]  If 7PM-7AM, please contact night-coverage [email or web address]

## 2020-12-27 NOTE — Progress Notes (Signed)
Pharmacy Antibiotic Note  Patrick Anthony is a 50 y.o. male admitted on 12/22/2020 with MRSA bacteremia. He was found to have possible small vegetations associated with the bioprosthetic mitral valve and native aortic valve on TEE today.  Pharmacy has been consulted for gentamicin dosing for prosthetic valve endocarditis. Patient is a HD patient with sessions on MWF. Last HD was this morning. Vd for Gent = 29.    Pt with complicated MRSA endocarditis and discitis who is currently on vanc/gent for his infection. Repeat bcx is still likely positive for MRSA, therefore, rifampin will be held off until clearance. Last pre-HD vanc level was in range.   Plan: Gent 80 mg every MWF after HD  Goal pre-HD level 3-4 Monitor weekly level  Will also plan to add rifampin once blood cultures clear  Height: 6' (182.9 cm) Weight: 71.4 kg (157 lb 6.5 oz) IBW/kg (Calculated) : 77.6  Temp (24hrs), Avg:98.4 F (36.9 C), Min:97.9 F (36.6 C), Max:99.2 F (37.3 C)  Recent Labs  Lab 12/22/20 0714 12/23/20 0119 12/24/20 0347 12/25/20 0056 12/26/20 0117 12/27/20 0218  WBC 20.2* 19.7* 21.1* 28.9* 23.8* 20.4*  CREATININE 8.20* 4.91* 6.91*  6.83* 4.78* 5.95* 7.45*  LATICACIDVEN 1.7  --   --   --   --   --   VANCORANDOM  --   --  22  --   --   --     Estimated Creatinine Clearance: 12.1 mL/min (A) (by C-G formula based on SCr of 7.45 mg/dL (H)).    No Known Allergies  Onnie Boer, PharmD, BCIDP, AAHIVP, CPP Infectious Disease Pharmacist 12/27/2020 1:04 PM

## 2020-12-27 NOTE — Plan of Care (Signed)
Patient placed on 2L O2 via [place] while asleep as he was de-sating to 80s. SpO2 now stable in mid-90s.

## 2020-12-27 NOTE — Progress Notes (Addendum)
[facility] Progress Note   Dialysis Orders: MWF -South 4hrs, [id number], DFR500, EDW 72kg,2K/2CaProfile 2  Access:LU AVG vs BCF Heparin5500 unit qHD Mircera261mg q2wks - last 5/9 Venofer '100mg'$  qHD x5 - 1 completed  Assessment/ Plan:   1. 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  2. 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 WBCs 28 -> 20.4. On  Vancomycin and Gent per pharmacy. s/p repeat TEE with MV vegetations.  MRI thoracic spine -> concerning for osteo/discitis T10/11.  Per primary team. ID following.  3. Back pain -MRI lumbar spine with no discitis/osteomyelitis, +degenerative disc disease at L4-L5, L5-S1. ID plans for MRI T spine. 4. Hypoxia-resolved.  Now on RA.  5. ESRD- On HD MWF. Continue on schedule.  Seen on  HD 5/23.  4K 2.25 Ca Lt upper arm access 3.5L net UF goal 159/98 HR 108  6. Hypertension/volume-BP elevated. Resume home meds. Under dry weight if weights correct. UF has been limited by hypotension. Tolerated 3L UF on Friday  7. Anemiaof CKD- Hgb 9.7- Next ESA dose due 5/23. .Marland KitchenHold IV iron due to acute infection. 8. Secondary Hyperparathyroidism -Ca in goal.Phos low - binders stopped -> phos 3 5/23. Continue sensipar '60mg'$  qd. Not on VDRA. 9. Nutrition- Renal diet w/fluid restriction. Protein supplement.   Subjective:   No new events. No complaints this am. No cp, sob, fever, chills today.    Objective:   BP (!) 159/98 (BP Location: Right Arm)   Pulse (!) 109   Temp 97.9 F (36.6 C)   Resp 20   Ht 6' (1.829 m)   Wt 74.9 kg   SpO2 97%   BMI 22.39 kg/m   Intake/Output Summary (Last 24 hours) at 12/27/2020 0830 Last data filed at 12/26/2020 2300 Gross per 24 hour  Intake 762.86 ml  Output --  Net 762.86 ml   Weight change: 1 kg  Physical Exam: General: alert, oriented  x3, male patient in NAD, supine  in bed Heart: Tachy, regular, no mrg Lungs: clear bilaterally, occasional rhonchi  Abdomen:soft, NTND Extremities: R BKA, no edema Dialysis Access: LU BCF in use   Imaging: MR THORACIC SPINE WO CONTRAST  Result Date: 12/25/2020 CLINICAL DATA:  Back pain.  Leukocytosis.  MRSA bacteremia EXAM: MRI THORACIC SPINE WITHOUT CONTRAST TECHNIQUE: Multiplanar, multisequence MR imaging of the thoracic spine was performed. No intravenous contrast was administered. COMPARISON:  MRI 09/29/2020 FINDINGS: Alignment:  Physiologic. Vertebrae: There is fluid signal present within the T10-11 intervertebral disc space with bone marrow edema at the superior endplate of T624THL More subtle changes along the inferior endplate of TQA348G Possible subtle central endplate erosion at T624THL(series 6, image 8). There is mild prevertebral soft tissue edema anterior to the T10 and T11 vertebral bodies (series 6, images 7-9). There is a similar degree of endplate marrow edema centered at the C6-7 intervertebral disc space, likely representing degenerative discogenic changes. No acute fracture. No suspicious bone lesion. Diffusely decreased T1 marrow signal throughout the visualized bone marrow, which may be related to chronic anemia. Cord: Normal signal and morphology. No evidence of epidural fluid collection. Paraspinal and other soft tissues: Small bilateral pleural effusions. No soft tissue fluid collection or abscess. Disc levels: Mild right greater than left foraminal stenosis at the T10-11 level. No canal stenosis at any level within the thoracic spine. IMPRESSION: 1. Findings concerning  for early discitis-osteomyelitis at the T10-11 level. Mild prevertebral soft tissue edema at this level. No evidence of an epidural fluid collection or abscess. 2. Moderate endplate marrow edema centered at the C6-7 intervertebral disc space is similar to the previous MRI, likely representing degenerative discogenic  changes. 3. Small bilateral pleural effusions. These results will be called to the ordering clinician or representative by the Radiologist Assistant, and communication documented in the PACS or [employer]. Electronically Signed   By: Davina Poke D.O.   On: 12/25/2020 17:51    Labs: BMET Recent Labs  Lab 12/22/20 0714 12/23/20 0119 12/24/20 0347 12/25/20 0056 12/26/20 0117 12/27/20 0218  NA 133* 135 133*  133* 137 133* 132*  K 4.4 3.4* 3.9  3.9 3.3* 3.4* 3.3*  CL 92* 95* 92*  92* 98 95* 95*  CO2 '25 25 27  27 24 26 22  '$ GLUCOSE 82 66* 105*  106* 90 107* 96  BUN 56* 29* 43*  42* 26* 37* 48*  CREATININE 8.20* 4.91* 6.91*  6.83* 4.78* 5.95* 7.45*  CALCIUM 8.8* 8.4* 9.1  9.2 8.7* 9.2 9.4  PHOS  --  1.8* 1.8*  1.8* 1.5* 2.4* 3.0   CBC Recent Labs  Lab 12/24/20 0347 12/25/20 0056 12/26/20 0117 12/27/20 0218  WBC 21.1* 28.9* 23.8* 20.4*  NEUTROABS 16.9* 23.7* 19.2* 15.3*  HGB 9.7* 10.0* 9.7* 9.2*  HCT 32.0* 32.8* 32.3* 30.7*  MCV 80.8 80.6 79.2* 78.9*  PLT 230 262 315 330    Medications:    . (feeding supplement) PROSource Plus  30 mL Oral BID BM  . Chlorhexidine Gluconate Cloth  6 each Topical Q0600  . cinacalcet  60 mg Oral Q supper  . diltiazem  30 mg Oral Q6H  . docusate sodium  100 mg Oral BID  . feeding supplement (NEPRO CARB STEADY)  237 mL Oral TID BM  . methocarbamol  500 mg Oral TID  . multivitamin with minerals  1 tablet Oral Daily  . oxyCODONE  15 mg Oral Q12H  . pantoprazole  40 mg Oral Daily  . pregabalin  75 mg Oral QHS  . senna-docusate  1 tablet Oral BID  . sodium chloride flush  3 mL Intravenous Q12H      Otelia Santee, MD 12/27/2020, 8:30 AM

## 2020-12-27 NOTE — Progress Notes (Signed)
   12/27/20 1215  Assess: MEWS Score  Temp 99.2 F (37.3 C)  BP (!) 153/90  Pulse Rate (!) 123  ECG Heart Rate (!) 127  Resp 20  Level of Consciousness Alert  SpO2 95 %  O2 Device Room Air  Assess: MEWS Score  MEWS Temp 0  MEWS Systolic 0  MEWS Pulse 2  MEWS RR 0  MEWS LOC 0  MEWS Score 2  MEWS Score Color Yellow  Assess: if the MEWS score is Yellow or Red  Were vital signs taken at a resting state? Yes  Focused Assessment No change from prior assessment  Early Detection of Sepsis Score *See Row Information* Medium  MEWS guidelines implemented *See Row Information* Yes  Treat  MEWS Interventions Administered scheduled meds/treatments  Pain Scale 0-10  Pain Score 0  Take Vital Signs  Increase Vital Sign Frequency  Yellow: Q 2hr X 2 then Q 4hr X 2, if remains yellow, continue Q 4hrs  Escalate  MEWS: Escalate Yellow: discuss with charge nurse/RN and consider discussing with provider and RRT  Notify: Charge Nurse/RN  Name of Charge Nurse/RN Notified Liana, RN  Date Charge Nurse/RN Notified 12/27/20  Time Charge Nurse/RN Notified 1219  Document  Progress note created (see row info) Yes

## 2020-12-27 NOTE — Progress Notes (Signed)
PHARMACY - PHYSICIAN COMMUNICATION CRITICAL VALUE ALERT - BLOOD CULTURE IDENTIFICATION (BCID)  Patrick Anthony is an 50 y.o. 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  Cultures drawn on 5/18 with MRSA.  Repeat cultures 5/22 w/ gram pos cocci in clusters in 2 aerobic bottles.   Name of physician (or Provider) Contacted: Dr. Alfredia Ferguson  Current antibiotics: vancomycin and gentamicin q MWF after HD  Changes to prescribed antibiotics recommended:  Patient is on recommended antibiotics - No changes needed  Results for orders placed or performed during the hospital encounter of 12/22/20  Blood Culture ID Panel (Reflexed) (Collected: 12/22/2020  7:14 AM)  Result Value Ref Range   Enterococcus faecalis NOT DETECTED NOT DETECTED   Enterococcus Faecium NOT DETECTED NOT DETECTED   Listeria monocytogenes NOT DETECTED NOT DETECTED   Staphylococcus species DETECTED (A) NOT DETECTED   Staphylococcus aureus (BCID) DETECTED (A) NOT DETECTED   Staphylococcus epidermidis NOT DETECTED NOT DETECTED   Staphylococcus lugdunensis NOT DETECTED NOT DETECTED   Streptococcus species NOT DETECTED NOT DETECTED   Streptococcus agalactiae NOT DETECTED NOT DETECTED   Streptococcus pneumoniae NOT DETECTED NOT DETECTED   Streptococcus pyogenes NOT DETECTED NOT DETECTED   A.calcoaceticus-baumannii NOT DETECTED NOT DETECTED   Bacteroides fragilis NOT DETECTED NOT DETECTED   Enterobacterales NOT DETECTED NOT DETECTED   Enterobacter cloacae complex NOT DETECTED NOT DETECTED   Escherichia coli NOT DETECTED NOT DETECTED   Klebsiella aerogenes NOT DETECTED NOT DETECTED   Klebsiella oxytoca NOT DETECTED NOT DETECTED   Klebsiella pneumoniae NOT DETECTED NOT DETECTED   Proteus species NOT DETECTED NOT DETECTED   Salmonella species NOT DETECTED NOT  DETECTED   Serratia marcescens NOT DETECTED NOT DETECTED   Haemophilus influenzae NOT DETECTED NOT DETECTED   Neisseria meningitidis NOT DETECTED NOT DETECTED   Pseudomonas aeruginosa NOT DETECTED NOT DETECTED   Stenotrophomonas maltophilia NOT DETECTED NOT DETECTED   Candida albicans NOT DETECTED NOT DETECTED   Candida auris NOT DETECTED NOT DETECTED   Candida glabrata NOT DETECTED NOT DETECTED   Candida krusei NOT DETECTED NOT DETECTED   Candida parapsilosis NOT DETECTED NOT DETECTED   Candida tropicalis NOT DETECTED NOT DETECTED   Cryptococcus neoformans/gattii NOT DETECTED NOT DETECTED   Meth resistant mecA/C and MREJ DETECTED (A) NOT DETECTED    Dimple Nanas, PharmD PGY-1 Acute Care Pharmacy Resident 12/27/2020 7:31 AM

## 2020-12-27 NOTE — Progress Notes (Addendum)
I have seen and examined the patient. I have personally reviewed the clinical findings, laboratory findings, microbiological data and imaging studies. The assessment and treatment plan was discussed with the  Advance Practice Provider.  I agree with her/his recommendations except following additions/corrections.  Was having HD this morning.  Blood cultures 5/22 both sets ( 1/2 bottles) positive for GPC in clusters, identification is pending  Will repeat 2 sets of blood cultures today  Noted that he is not a surgical candidate per TCTS MRI T spine "  early discitis-osteomyelitis at the T10-11 level. Mild prevertebral soft tissue edema at this level. No evidence of an epidural fluid collection or abscess.  Continue Vancomycin and gentamicin, pharmacy to dose Hold on adding Rifampin for now pending clearance of blood cultures  Monitor CBC, CMP, Vanc trough and gent levels   Rosiland Oz, MD [place] [facility] [place] [facility]  Date of Admission:  12/22/2020      Total days of antibiotics 5   Vancomycin 5/18 >> current   Gentamicin 5/20 >> current    ASSESSMENT: Morell Brace is a 50 y.o. 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 TCTS consult completed and too high risk for re-do valve at this time and recommended optimal medical therapy. Continue vancomycin and gentamicin. Will give him another 24h of no fevers and ensure repeat BCx drawn 5/22 are not growing before adding rifampin. May have been reflective of embolization event with blurry vision and new acute CNS infarcts. He seems to need frequent reminding of events, but overall alert and  oriented to place and time. He is feeling better today as well.   Acute discitis / OM of T10-11 = MRSA; no neurologic symptoms. He will get a prolonged course of antibiotic treatment to cover for this as well.   Sinus tachycardia / SVT = cardiology following. 120s on telemetry on HD.   DDI risk = fortunately no major drug interactions when we need to add rifampin.    PLAN: 1. Continue vancomycin & gentamicin  2. Follow blood cultures & fever curve 3. Anticipate addition of rifampin tomorrow for triple therapy    Principal Problem:   Bacteremia due to Staphylococcus aureus Active Problems:   Anemia in chronic kidney disease   Hepatitis C   ESRD on dialysis (HCC)   S/P mitral valve replacement   S/P AKA (above knee amputation), right (HCC)   Personal history of Methicillin resistant Staphylococcus aureus infection   SIRS (systemic inflammatory response syndrome) ([place])   . (feeding supplement) PROSource Plus  30 mL Oral BID BM  . Chlorhexidine Gluconate Cloth  6 each Topical Q0600  . cinacalcet  60 mg Oral Q supper  . diltiazem  30 mg Oral Q6H  . docusate sodium  100 mg Oral BID  . feeding supplement (NEPRO CARB STEADY)  237 mL Oral TID BM  . methocarbamol  500 mg Oral TID  . multivitamin with minerals  1 tablet Oral Daily  . oxyCODONE  15 mg Oral Q12H  . pantoprazole  40 mg Oral Daily  . pregabalin  75 mg Oral QHS  . senna-docusate  1 tablet Oral BID  . sodium chloride flush  3 mL  Intravenous Q12H    SUBJECTIVE: Undergoing HD. Having chills currently but he attributes these to be a normal reaction with hemodialysis    Review of Systems: Review of Systems  Constitutional: Positive for chills. Negative for diaphoresis, fever and malaise/fatigue.  Respiratory: Negative for cough and shortness of breath.   Cardiovascular: Positive for palpitations. Negative for chest pain.  Gastrointestinal: Negative for abdominal pain, nausea and vomiting.  Musculoskeletal: Positive for  back pain (improving). Negative for myalgias.  Skin: Negative for rash.  Neurological: Negative for dizziness, weakness and headaches.  Psychiatric/Behavioral: Positive for memory loss (hard to recall some information at times).      No Known Allergies  OBJECTIVE: Vitals:   12/27/20 0800 12/27/20 0830 12/27/20 0900 12/27/20 0930  BP: (!) 159/98 (!) 184/106 (!) 187/94 (!) 158/111  Pulse:      Resp: 20 (!) 21 (!) 21 (!) 23  Temp:      TempSrc:      SpO2:      Weight:      Height:       Body mass index is 22.39 kg/m.  Physical Exam Vitals and nursing note reviewed.  Constitutional:      Appearance: Normal appearance. He is not ill-appearing.  HENT:     Mouth/Throat:     Mouth: Mucous membranes are moist.     Pharynx: Oropharynx is clear.  Eyes:     General: No scleral icterus.    Pupils: Pupils are equal, round, and reactive to light.  Cardiovascular:     Rate and Rhythm: Regular rhythm. Tachycardia present.     Heart sounds: No murmur heard.   Pulmonary:     Effort: Pulmonary effort is normal.     Breath sounds: Normal breath sounds.  Abdominal:     General: Bowel sounds are normal.     Palpations: Abdomen is soft.  Musculoskeletal:     Comments: R stump with scabbed incision. No warmth or fluctuance.   Skin:    General: Skin is warm and dry.     Capillary Refill: Capillary refill takes less than 2 seconds.  Neurological:     Mental Status: He is alert and oriented to person, place, and time.  Psychiatric:     Comments: Seems forgetful with events over the weekend and forgot blurry vision episode and new stroke information.      Lab Results Lab Results  Component Value Date   WBC 20.4 (H) 12/27/2020   HGB 9.2 (L) 12/27/2020   HCT 30.7 (L) 12/27/2020   MCV 78.9 (L) 12/27/2020   PLT 330 12/27/2020    Lab Results  Component Value Date   CREATININE 7.45 (H) 12/27/2020   BUN 48 (H) 12/27/2020   NA 132 (L) 12/27/2020   K 3.3 (L) 12/27/2020   CL 95 (L)  12/27/2020   CO2 22 12/27/2020    Lab Results  Component Value Date   ALT 10 12/27/2020   AST 22 12/27/2020   ALKPHOS 71 12/27/2020   BILITOT 1.5 (H) 12/27/2020     Microbiology: Recent Results (from the past 240 hour(s))  Resp Panel by RT-PCR (Flu A&B, Covid) Nasopharyngeal Swab     Status: None   Collection Time: 12/22/20  7:14 AM   Specimen: Nasopharyngeal Swab; Nasopharyngeal(NP) swabs in vial transport medium  Result Value Ref Range Status   SARS Coronavirus 2 by RT PCR NEGATIVE NEGATIVE Final    Comment: (NOTE) SARS-CoV-2 target nucleic acids are NOT DETECTED.  The SARS-CoV-2 RNA  is generally detectable in upper respiratory specimens during the acute phase of infection. The lowest concentration of SARS-CoV-2 viral copies this assay can detect is 138 copies/mL. A negative result does not preclude SARS-Cov-2 infection and should not be used as the sole basis for treatment or other patient management decisions. A negative result may occur with  improper specimen collection/handling, submission of specimen other than nasopharyngeal swab, presence of viral mutation(s) within the areas targeted by this assay, and inadequate number of viral copies(<138 copies/mL). A negative result must be combined with clinical observations, patient history, and epidemiological information. The expected result is Negative.  Fact Sheet for Patients:  EntrepreneurPulse.com.au  Fact Sheet for Healthcare Providers:  IncredibleEmployment.be  This test is no t yet approved or cleared by the [place] FDA and  has been authorized for detection and/or diagnosis of SARS-CoV-2 by FDA under an Emergency Use Authorization (EUA). This EUA will remain  in effect (meaning this test can be used) for the duration of the COVID-19 declaration under Section 564(b)(1) of the Act, 21 U.S.C.section 360bbb-3(b)(1), unless the authorization is terminated  or revoked sooner.        Influenza A by PCR NEGATIVE NEGATIVE Final   Influenza B by PCR NEGATIVE NEGATIVE Final    Comment: (NOTE) The Xpert Xpress SARS-CoV-2/FLU/RSV plus assay is intended as an aid in the diagnosis of influenza from Nasopharyngeal swab specimens and should not be used as a sole basis for treatment. Nasal washings and aspirates are unacceptable for Xpert Xpress SARS-CoV-2/FLU/RSV testing.  Fact Sheet for Patients: EntrepreneurPulse.com.au  Fact Sheet for Healthcare Providers: IncredibleEmployment.be  This test is not yet approved or cleared by the [place] FDA and has been authorized for detection and/or diagnosis of SARS-CoV-2 by FDA under an Emergency Use Authorization (EUA). This EUA will remain in effect (meaning this test can be used) for the duration of the COVID-19 declaration under Section 564(b)(1) of the Act, 21 U.S.C. section 360bbb-3(b)(1), unless the authorization is terminated or revoked.  Performed at [facility], [place] [address] [place], [place] [address]   Blood Culture (routine x 2)     Status: Abnormal   Collection Time: 12/22/20  7:14 AM   Specimen: BLOOD  Result Value Ref Range Status   Specimen Description BLOOD SITE NOT SPECIFIED  Final   Special Requests   Final    BOTTLES DRAWN AEROBIC AND ANAEROBIC Blood Culture adequate volume   Culture  Setup Time   Final    GRAM POSITIVE COCCI IN CLUSTERS IN BOTH AEROBIC AND ANAEROBIC BOTTLES CRITICAL RESULT CALLED TO, READ BACK BY AND VERIFIED WITHDion Body [facility] 2028 12/22/20 A BROWNING Performed at [facility], [place] [address], [place], [place] [address]    Culture METHICILLIN RESISTANT STAPHYLOCOCCUS AUREUS (A)  Final   Report Status 12/24/2020 FINAL  Final   Organism ID, Bacteria METHICILLIN RESISTANT STAPHYLOCOCCUS AUREUS  Final      Susceptibility   Methicillin resistant staphylococcus aureus - MIC*    CIPROFLOXACIN 4 RESISTANT Resistant      ERYTHROMYCIN >=8 RESISTANT Resistant     GENTAMICIN <=0.5 SENSITIVE Sensitive     OXACILLIN >=4 RESISTANT Resistant     TETRACYCLINE <=1 SENSITIVE Sensitive     VANCOMYCIN 1 SENSITIVE Sensitive     TRIMETH/SULFA <=10 SENSITIVE Sensitive     CLINDAMYCIN <=0.25 SENSITIVE Sensitive     RIFAMPIN <=0.5 SENSITIVE Sensitive     Inducible Clindamycin NEGATIVE Sensitive     * METHICILLIN  RESISTANT STAPHYLOCOCCUS AUREUS  Blood Culture ID Panel (Reflexed)     Status: Abnormal   Collection Time: 12/22/20  7:14 AM  Result Value Ref Range Status   Enterococcus faecalis NOT DETECTED NOT DETECTED Final   Enterococcus Faecium NOT DETECTED NOT DETECTED Final   Listeria monocytogenes NOT DETECTED NOT DETECTED Final   Staphylococcus species DETECTED (A) NOT DETECTED Final    Comment: CRITICAL RESULT CALLED TO, READ BACK BY AND VERIFIED WITH: J [facility] PHARMD 2028 12/22/20 A BROWNING    Staphylococcus aureus (BCID) DETECTED (A) NOT DETECTED Final    Comment: Methicillin (oxacillin)-resistant Staphylococcus aureus (MRSA). MRSA is predictably resistant to beta-lactam antibiotics (except ceftaroline). Preferred therapy is vancomycin unless clinically contraindicated. Patient requires contact precautions if  hospitalized. CRITICAL RESULT CALLED TO, READ BACK BY AND VERIFIED WITH: Dion Body PHARMD 2028 12/22/20 A BROWNING    Staphylococcus epidermidis NOT DETECTED NOT DETECTED Final   Staphylococcus lugdunensis NOT DETECTED NOT DETECTED Final   Streptococcus species NOT DETECTED NOT DETECTED Final   Streptococcus agalactiae NOT DETECTED NOT DETECTED Final   Streptococcus pneumoniae NOT DETECTED NOT DETECTED Final   Streptococcus pyogenes NOT DETECTED NOT DETECTED Final   A.calcoaceticus-baumannii NOT DETECTED NOT DETECTED Final   Bacteroides fragilis NOT DETECTED NOT DETECTED Final   Enterobacterales NOT DETECTED NOT DETECTED Final   Enterobacter cloacae complex NOT DETECTED NOT DETECTED Final   Escherichia  coli NOT DETECTED NOT DETECTED Final   Klebsiella aerogenes NOT DETECTED NOT DETECTED Final   Klebsiella oxytoca NOT DETECTED NOT DETECTED Final   Klebsiella pneumoniae NOT DETECTED NOT DETECTED Final   Proteus species NOT DETECTED NOT DETECTED Final   Salmonella species NOT DETECTED NOT DETECTED Final   Serratia marcescens NOT DETECTED NOT DETECTED Final   Haemophilus influenzae NOT DETECTED NOT DETECTED Final   Neisseria meningitidis NOT DETECTED NOT DETECTED Final   Pseudomonas aeruginosa NOT DETECTED NOT DETECTED Final   Stenotrophomonas maltophilia NOT DETECTED NOT DETECTED Final   Candida albicans NOT DETECTED NOT DETECTED Final   Candida auris NOT DETECTED NOT DETECTED Final   Candida glabrata NOT DETECTED NOT DETECTED Final   Candida krusei NOT DETECTED NOT DETECTED Final   Candida parapsilosis NOT DETECTED NOT DETECTED Final   Candida tropicalis NOT DETECTED NOT DETECTED Final   Cryptococcus neoformans/gattii NOT DETECTED NOT DETECTED Final   Meth resistant mecA/C and MREJ DETECTED (A) NOT DETECTED Final    Comment: CRITICAL RESULT CALLED TO, READ BACK BY AND VERIFIED WITHDion Body [facility] 2028 12/22/20 A BROWNING Performed at [facility], [address] [place], [place] [address]   Blood Culture (routine x 2)     Status: Abnormal   Collection Time: 12/22/20  7:19 AM   Specimen: BLOOD  Result Value Ref Range Status   Specimen Description BLOOD SITE NOT SPECIFIED  Final   Special Requests   Final    BOTTLES DRAWN AEROBIC AND ANAEROBIC Blood Culture results may not be optimal due to an excessive volume of blood received in culture bottles   Culture  Setup Time   Final    GRAM POSITIVE COCCI IN CLUSTERS IN BOTH AEROBIC AND ANAEROBIC BOTTLES IDENTIFICATION TO FOLLOW CRITICAL RESULT CALLED TO, READ BACK BY AND VERIFIED WITH: J MILLEN PHARMD 2028 12/22/20 A BROWNING    Culture (A)  Final    STAPHYLOCOCCUS AUREUS SUSCEPTIBILITIES PERFORMED ON PREVIOUS CULTURE WITHIN THE  LAST 5 DAYS. Performed at [facility], [place] [address] [place], [place] [address]  Report Status 12/24/2020 FINAL  Final  Culture, blood (routine x 2)     Status: None (Preliminary result)   Collection Time: 12/24/20  3:10 PM   Specimen: BLOOD RIGHT HAND  Result Value Ref Range Status   Specimen Description BLOOD RIGHT HAND  Final   Special Requests   Final    BOTTLES DRAWN AEROBIC ONLY Blood Culture adequate volume   Culture   Final    NO GROWTH 3 DAYS Performed at [facility], [address], [place], [place] [address]    Report Status PENDING  Incomplete  Culture, blood (routine x 2)     Status: None (Preliminary result)   Collection Time: 12/24/20  3:37 PM   Specimen: BLOOD  Result Value Ref Range Status   Specimen Description BLOOD BLOOD RIGHT FOREARM  Final   Special Requests   Final    BOTTLES DRAWN AEROBIC ONLY Blood Culture adequate volume   Culture   Final    NO GROWTH 3 DAYS Performed at [facility], [place] [address] [place], [place] [address]    Report Status PENDING  Incomplete  MRSA PCR Screening     Status: None   Collection Time: 12/25/20 10:18 PM   Specimen: Nasal Mucosa; Nasopharyngeal  Result Value Ref Range Status   MRSA by PCR NEGATIVE NEGATIVE Final    Comment:        The GeneXpert MRSA Assay (FDA approved for NASAL specimens only), is one component of a comprehensive MRSA colonization surveillance program. It is not intended to diagnose MRSA infection nor to guide or monitor treatment for MRSA infections. Performed at [facility], [place] [address], [place], [place] [address]   Culture, blood (routine x 2)     Status: None (Preliminary result)   Collection Time: 12/26/20 10:19 AM   Specimen: BLOOD RIGHT HAND  Result Value Ref Range Status   Specimen Description BLOOD RIGHT HAND  Final   Special Requests   Final    BOTTLES DRAWN AEROBIC ONLY Blood Culture results may not be optimal due to an inadequate volume of blood  received in culture bottles   Culture  Setup Time   Final    GRAM POSITIVE COCCI IN CLUSTERS AEROBIC BOTTLE ONLY CRITICAL RESULT CALLED TO, READ BACK BY AND VERIFIED WITH: PHARMD TM TUCKER [id number] AT 717 BY CM Performed at [facility], [place] [address] [place], [place] [address]    Culture GRAM POSITIVE COCCI IN CLUSTERS  Final   Report Status PENDING  Incomplete  Culture, blood (routine x 2)     Status: None (Preliminary result)   Collection Time: 12/26/20 10:19 AM   Specimen: BLOOD RIGHT WRIST  Result Value Ref Range Status   Specimen Description BLOOD RIGHT WRIST  Final   Special Requests   Final    BOTTLES DRAWN AEROBIC ONLY Blood Culture results may not be optimal due to an inadequate volume of blood received in culture bottles   Culture  Setup Time   Final    GRAM POSITIVE COCCI IN CLUSTERS AEROBIC BOTTLE ONLY CRITICAL RESULT CALLED TO, READ BACK BY AND VERIFIED WITH: Trude Mcburney [id number] AT 717 BY CM Performed at [facility], [place] [address], [place], [place] [address]    Culture GRAM POSITIVE COCCI IN CLUSTERS  Final   Report Status PENDING  Incomplete     Janene Madeira, MSN, NP-C [place] for Infectious Disease [email or web address]  Pager: [phone] Office: [phone] [place]: [phone]

## 2020-12-28 DIAGNOSIS — I38 Endocarditis, valve unspecified: Secondary | ICD-10-CM | POA: Diagnosis not present

## 2020-12-28 DIAGNOSIS — Z7189 Other specified counseling: Secondary | ICD-10-CM | POA: Diagnosis not present

## 2020-12-28 DIAGNOSIS — I471 Supraventricular tachycardia: Secondary | ICD-10-CM | POA: Diagnosis not present

## 2020-12-28 DIAGNOSIS — B182 Chronic viral hepatitis C: Secondary | ICD-10-CM | POA: Diagnosis not present

## 2020-12-28 DIAGNOSIS — Z89611 Acquired absence of right leg above knee: Secondary | ICD-10-CM | POA: Diagnosis not present

## 2020-12-28 DIAGNOSIS — N186 End stage renal disease: Secondary | ICD-10-CM | POA: Diagnosis not present

## 2020-12-28 DIAGNOSIS — T826XXA Infection and inflammatory reaction due to cardiac valve prosthesis, initial encounter: Secondary | ICD-10-CM | POA: Diagnosis not present

## 2020-12-28 DIAGNOSIS — Z952 Presence of prosthetic heart valve: Secondary | ICD-10-CM | POA: Diagnosis not present

## 2020-12-28 DIAGNOSIS — B9561 Methicillin susceptible Staphylococcus aureus infection as the cause of diseases classified elsewhere: Secondary | ICD-10-CM | POA: Diagnosis not present

## 2020-12-28 DIAGNOSIS — Z515 Encounter for palliative care: Secondary | ICD-10-CM

## 2020-12-28 DIAGNOSIS — R7881 Bacteremia: Secondary | ICD-10-CM | POA: Diagnosis not present

## 2020-12-28 DIAGNOSIS — I248 Other forms of acute ischemic heart disease: Secondary | ICD-10-CM | POA: Diagnosis not present

## 2020-12-28 DIAGNOSIS — R531 Weakness: Secondary | ICD-10-CM

## 2020-12-28 LAB — COMPREHENSIVE METABOLIC PANEL
ALT: 10 U/L (ref 0–44)
AST: 25 U/L (ref 15–41)
Albumin: 2.2 g/dL — ABNORMAL LOW (ref 3.5–5.0)
Alkaline Phosphatase: 87 U/L (ref 38–126)
Anion gap: 13 (ref 5–15)
BUN: 25 mg/dL — ABNORMAL HIGH (ref 6–20)
CO2: 27 mmol/L (ref 22–32)
Calcium: 9.1 mg/dL (ref 8.9–10.3)
Chloride: 94 mmol/L — ABNORMAL LOW (ref 98–111)
Creatinine, Ser: 4.94 mg/dL — ABNORMAL HIGH (ref 0.61–1.24)
GFR, Estimated: 14 mL/min — ABNORMAL LOW (ref >60)
Glucose, Bld: 95 mg/dL (ref 70–99)
Potassium: 3.6 mmol/L (ref 3.5–5.1)
Sodium: 134 mmol/L — ABNORMAL LOW (ref 135–145)
Total Bilirubin: 0.8 mg/dL (ref 0.3–1.2)
Total Protein: 8.3 g/dL — ABNORMAL HIGH (ref 6.5–8.1)

## 2020-12-28 LAB — CBC WITH DIFFERENTIAL/PLATELET
Abs Immature Granulocytes: 0.49 10*3/uL — ABNORMAL HIGH (ref 0.00–0.07)
Basophils Absolute: 0.1 10*3/uL (ref 0.0–0.1)
Basophils Relative: 0 %
Eosinophils Absolute: 0.1 10*3/uL (ref 0.0–0.5)
Eosinophils Relative: 1 %
HCT: 32.4 % — ABNORMAL LOW (ref 39.0–52.0)
Hemoglobin: 9.6 g/dL — ABNORMAL LOW (ref 13.0–17.0)
Immature Granulocytes: 3 %
Lymphocytes Relative: 8 %
Lymphs Abs: 1.4 10*3/uL (ref 0.7–4.0)
MCH: 23.7 pg — ABNORMAL LOW (ref 26.0–34.0)
MCHC: 29.6 g/dL — ABNORMAL LOW (ref 30.0–36.0)
MCV: 80 fL (ref 80.0–100.0)
Monocytes Absolute: 2.7 10*3/uL — ABNORMAL HIGH (ref 0.1–1.0)
Monocytes Relative: 14 %
Neutro Abs: 14.1 10*3/uL — ABNORMAL HIGH (ref 1.7–7.7)
Neutrophils Relative %: 74 %
Platelets: 337 10*3/uL (ref 150–400)
RBC: 4.05 MIL/uL — ABNORMAL LOW (ref 4.22–5.81)
RDW: 21.2 % — ABNORMAL HIGH (ref 11.5–15.5)
WBC: 18.8 10*3/uL — ABNORMAL HIGH (ref 4.0–10.5)
nRBC: 0 % (ref 0.0–0.2)

## 2020-12-28 LAB — MAGNESIUM: Magnesium: 2.1 mg/dL (ref 1.7–2.4)

## 2020-12-28 LAB — SEDIMENTATION RATE: Sed Rate: 69 mm/hr — ABNORMAL HIGH (ref 0–16)

## 2020-12-28 LAB — PHOSPHORUS: Phosphorus: 2.3 mg/dL — ABNORMAL LOW (ref 2.5–4.6)

## 2020-12-28 LAB — PROCALCITONIN: Procalcitonin: 23.98 ng/mL

## 2020-12-28 LAB — C-REACTIVE PROTEIN: CRP: 17.2 mg/dL — ABNORMAL HIGH (ref <1.0)

## 2020-12-28 MED ORDER — DILTIAZEM HCL ER COATED BEADS 120 MG PO CP24
120.0000 mg | ORAL_CAPSULE | Freq: Every day | ORAL | Status: DC
  Administered 2020-12-28 – 2021-01-06 (×10): 120 mg via ORAL
  Filled 2020-12-28 (×10): qty 1

## 2020-12-28 MED ORDER — ACETAMINOPHEN 325 MG PO TABS
325.0000 mg | ORAL_TABLET | Freq: Once | ORAL | Status: AC
  Administered 2020-12-28: 325 mg via ORAL
  Filled 2020-12-28: qty 1

## 2020-12-28 NOTE — Progress Notes (Signed)
Progress Note  Patient Name: Patrick Anthony Date of Encounter: 12/28/2020  [facility] Cardiologist: Fransico Him, MD   Subjective   BP improved but remains elevated, 138/100 this morning.  Denies any chest pain or dyspnea.  Inpatient Medications    Scheduled Meds: . (feeding supplement) PROSource Plus  30 mL Oral BID BM  . Chlorhexidine Gluconate Cloth  6 each Topical Q0600  . cinacalcet  60 mg Oral Q supper  . diltiazem  30 mg Oral Q6H  . docusate sodium  100 mg Oral BID  . feeding supplement (NEPRO CARB STEADY)  237 mL Oral TID BM  . methocarbamol  500 mg Oral TID  . multivitamin with minerals  1 tablet Oral Daily  . oxyCODONE  15 mg Oral Q12H  . pantoprazole  40 mg Oral Daily  . pregabalin  75 mg Oral QHS  . senna-docusate  1 tablet Oral BID  . sodium chloride flush  3 mL Intravenous Q12H   Continuous Infusions: . gentamicin 80 mg (12/27/20 1751)  . vancomycin Stopped (12/27/20 1214)   PRN Meds: acetaminophen **OR** acetaminophen, albuterol, bisacodyl, ondansetron **OR** ondansetron (ZOFRAN) IV, polyethylene glycol   Vital Signs    Vitals:   12/28/20 0000 12/28/20 0356 12/28/20 0451 12/28/20 0740  BP: 125/80 (!) 133/91  (!) 138/100  Pulse: (!) 112 (!) 116  (!) 107  Resp: (!) '25 15  16  '$ Temp: 99.3 F (37.4 C) (!) 100.4 F (38 C)  97.6 F (36.4 C)  TempSrc: Oral Oral  Oral  SpO2: 98% 94%  94%  Weight:   71.1 kg   Height:        Intake/Output Summary (Last 24 hours) at 12/28/2020 1031 Last data filed at 12/27/2020 2200 Gross per 24 hour  Intake 890 ml  Output 3500 ml  Net -2610 ml   Last 3 Weights 12/28/2020 12/27/2020 12/27/2020  Weight (lbs) 156 lb 12 oz 157 lb 6.5 oz 165 lb 2 oz  Weight (kg) 71.1 kg 71.4 kg 74.9 kg      Telemetry    Sinus tachycardia 100-120s- Personally Reviewed  ECG    No new ECG- Personally Reviewed  Physical Exam   GEN: No acute distress.   Neck: No JVD Cardiac: Tachycardic, regular, no murmurs Respiratory: Clear  to auscultation bilaterally. GI: Soft, nontender, non-distended  MS: No edema; No deformity. Neuro:  Nonfocal  Psych: Normal affect   Labs    High Sensitivity Troponin:   Recent Labs  Lab 12/26/20 1019 12/26/20 1214  TROPONINIHS 613* 774*      Chemistry Recent Labs  Lab 12/26/20 0117 12/27/20 0218 12/28/20 0222  NA 133* 132* 134*  K 3.4* 3.3* 3.6  CL 95* 95* 94*  CO2 '26 22 27  '$ GLUCOSE 107* 96 95  BUN 37* 48* 25*  CREATININE 5.95* 7.45* 4.94*  CALCIUM 9.2 9.4 9.1  PROT 8.4* 8.7* 8.3*  ALBUMIN 2.2* 2.1* 2.2*  AST '25 22 25  '$ ALT '9 10 10  '$ ALKPHOS 76 71 87  BILITOT 1.1 1.5* 0.8  GFRNONAA 11* 8* 14*  ANIONGAP '12 15 13     '$ Hematology Recent Labs  Lab 12/26/20 0117 12/27/20 0218 12/28/20 0222  WBC 23.8* 20.4* 18.8*  RBC 4.08* 3.89* 4.05*  HGB 9.7* 9.2* 9.6*  HCT 32.3* 30.7* 32.4*  MCV 79.2* 78.9* 80.0  MCH 23.8* 23.7* 23.7*  MCHC 30.0 30.0 29.6*  RDW 20.5* 20.7* 21.2*  PLT 315 330 337    BNPNo results for input(s): BNP,  PROBNP in the last 168 hours.   DDimer No results for input(s): DDIMER in the last 168 hours.   Radiology    No results found.  Cardiac Studies    Patient Profile     50 y.o. 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  SVT, possibly atypical atrial flutter -He was given adenosine 6 mg with possible flutter waves per Dr Audie Box.  Buddy Duty on diltiazem drip.  Currently on sinus tachycardia with rate 100s and has been switched to p.o. 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   Bioprosthetic mitral valve endocarditis -Blood cultures on 5/18 growing MRSA.  TEE 5/20 concerning for MV bioprosthetic endocarditis, trivial MR -Evaluated by Dr. Cyndia Bent, not a good candidate for redo mitral valve surgery. -Would continue antibiotics.  3. Troponin elevation -Troponin peak 774 -Likely demand ischemia in setting of acute illness, with sepsis and tachyarrhythmia as above -Normal coronaries on cath 10/2020  For questions or updates, please contact [place] Please consult [email or web address] for contact info under        Signed, Donato Heinz, MD  12/28/2020, 10:31 AM

## 2020-12-28 NOTE — Progress Notes (Signed)
[facility] Infectious Diseases Follow Up Note  Patient Identification: Patient Name: Patrick Anthony MRN: [id number] [place] Date: 12/22/2020  6:57 AM Age: 50 y.o.Today's Date: 12/28/2020   Reason for Visit: Follow-up on endocarditis  Principal Problem:   Bacteremia due to Staphylococcus aureus Active Problems:   Anemia in chronic kidney disease   Hepatitis C   ESRD on dialysis ([place])   S/P mitral valve replacement   S/P AKA (above knee amputation), right (HCC)   Personal history of Methicillin resistant Staphylococcus aureus infection   SIRS (systemic inflammatory response syndrome) (HCC)   Antibiotics: Vancomycin 5/18-current                    Gentamicin 5/20-current   Lines/Tubes: Left upper arm AV graft, PIV's  Interval Events: Fever with T-max 102.9, leukocytosis down to 18 from 20.  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 Will get genotype Following   Rest of the management as per the primary team. Thank you for the consult. Please page with pertinent questions or concerns.  ______________________________________________________________________ Subjective patient seen and examined at the bedside.  Sitting up in bed.  Feels he is clinically getting better and he is improving.  No new complaints today  Vitals BP (!) 138/100 (BP Location: Right Arm)   Pulse (!) 107   Temp 97.6 F (36.4 C) (Oral)   Resp 16   Ht 6' (1.829 m)   Wt 71.1 kg   SpO2 94%   BMI  21.26 kg/m     Physical Exam Constitutional:   Not in acute distress and looks comfortable    Comments: Normocephalic and atraumatic  Cardiovascular:     Rate and Rhythm: Normal rate and regular rhythm.     Heart sounds:   Pulmonary:     Effort: Pulmonary effort is normal.     Comments: Not in respiratory distress  Abdominal:     Palpations: Abdomen is soft.     Tenderness: Nontender and nondistended  Musculoskeletal:        General: No swelling or tenderness.  Right leg AKA  Skin:    Comments: No skin lesions or rashes.  Left AV graft no erythema and nontender  Neurological:     General: No focal deficit present.   Psychiatric:        Mood and Affect: Mood normal.   Pertinent Microbiology Results for orders placed or performed during the hospital encounter of 12/22/20  Resp Panel by RT-PCR (Flu A&B, Covid) Nasopharyngeal Swab     Status: None   Collection Time: 12/22/20  7:14 AM   Specimen: Nasopharyngeal Swab; Nasopharyngeal(NP) swabs in vial transport medium  Result Value Ref Range Status   SARS Coronavirus 2 by RT PCR NEGATIVE NEGATIVE Final    Comment: (NOTE) SARS-CoV-2 target nucleic acids are NOT DETECTED.  The SARS-CoV-2 RNA is generally detectable in upper respiratory specimens during the acute phase of infection. The lowest concentration of SARS-CoV-2 viral copies this assay can detect is 138 copies/mL. A negative result does not preclude SARS-Cov-2 infection and should not be used  as the sole basis for treatment or other patient management decisions. A negative result may occur with  improper specimen collection/handling, submission of specimen other than nasopharyngeal swab, presence of viral mutation(s) within the areas targeted by this assay, and inadequate number of viral copies(<138 copies/mL). A negative result must be combined with clinical observations, patient history, and epidemiological information. The expected result is Negative.  Fact  Sheet for Patients:  EntrepreneurPulse.com.au  Fact Sheet for Healthcare Providers:  IncredibleEmployment.be  This test is no t yet approved or cleared by the [place] FDA and  has been authorized for detection and/or diagnosis of SARS-CoV-2 by FDA under an Emergency Use Authorization (EUA). This EUA will remain  in effect (meaning this test can be used) for the duration of the COVID-19 declaration under Section 564(b)(1) of the Act, 21 U.S.C.section 360bbb-3(b)(1), unless the authorization is terminated  or revoked sooner.       Influenza A by PCR NEGATIVE NEGATIVE Final   Influenza B by PCR NEGATIVE NEGATIVE Final    Comment: (NOTE) The Xpert Xpress SARS-CoV-2/FLU/RSV plus assay is intended as an aid in the diagnosis of influenza from Nasopharyngeal swab specimens and should not be used as a sole basis for treatment. Nasal washings and aspirates are unacceptable for Xpert Xpress SARS-CoV-2/FLU/RSV testing.  Fact Sheet for Patients: EntrepreneurPulse.com.au  Fact Sheet for Healthcare Providers: IncredibleEmployment.be  This test is not yet approved or cleared by the [place] FDA and has been authorized for detection and/or diagnosis of SARS-CoV-2 by FDA under an Emergency Use Authorization (EUA). This EUA will remain in effect (meaning this test can be used) for the duration of the COVID-19 declaration under Section 564(b)(1) of the Act, 21 U.S.C. section 360bbb-3(b)(1), unless the authorization is terminated or revoked.  Performed at [facility], [place] [address], [place], [place] [address]   Blood Culture (routine x 2)     Status: Abnormal   Collection Time: 12/22/20  7:14 AM   Specimen: BLOOD  Result Value Ref Range Status   Specimen Description BLOOD SITE NOT SPECIFIED  Final   Special Requests   Final    BOTTLES DRAWN AEROBIC AND ANAEROBIC Blood Culture adequate volume   Culture   Setup Time   Final    GRAM POSITIVE COCCI IN CLUSTERS IN BOTH AEROBIC AND ANAEROBIC BOTTLES CRITICAL RESULT CALLED TO, READ BACK BY AND VERIFIED WITHDion Body [facility] 2028 12/22/20 A BROWNING Performed at [facility], [place] [address] [place], [place] [address]    Culture METHICILLIN RESISTANT STAPHYLOCOCCUS AUREUS (A)  Final   Report Status 12/24/2020 FINAL  Final   Organism ID, Bacteria METHICILLIN RESISTANT STAPHYLOCOCCUS AUREUS  Final      Susceptibility   Methicillin resistant staphylococcus aureus - MIC*    CIPROFLOXACIN 4 RESISTANT Resistant     ERYTHROMYCIN >=8 RESISTANT Resistant     GENTAMICIN <=0.5 SENSITIVE Sensitive     OXACILLIN >=4 RESISTANT Resistant     TETRACYCLINE <=1 SENSITIVE Sensitive     VANCOMYCIN 1 SENSITIVE Sensitive     TRIMETH/SULFA <=10 SENSITIVE Sensitive     CLINDAMYCIN <=0.25 SENSITIVE Sensitive     RIFAMPIN <=0.5 SENSITIVE Sensitive     Inducible Clindamycin NEGATIVE Sensitive     * METHICILLIN RESISTANT STAPHYLOCOCCUS AUREUS  Blood Culture ID Panel (Reflexed)     Status: Abnormal   Collection Time: 12/22/20  7:14 AM  Result Value Ref Range Status   Enterococcus faecalis NOT DETECTED NOT DETECTED Final  Enterococcus Faecium NOT DETECTED NOT DETECTED Final   Listeria monocytogenes NOT DETECTED NOT DETECTED Final   Staphylococcus species DETECTED (A) NOT DETECTED Final    Comment: CRITICAL RESULT CALLED TO, READ BACK BY AND VERIFIED WITH: Dion Body PHARMD 2028 12/22/20 A BROWNING    Staphylococcus aureus (BCID) DETECTED (A) NOT DETECTED Final    Comment: Methicillin (oxacillin)-resistant Staphylococcus aureus (MRSA). MRSA is predictably resistant to beta-lactam antibiotics (except ceftaroline). Preferred therapy is vancomycin unless clinically contraindicated. Patient requires contact precautions if  hospitalized. CRITICAL RESULT CALLED TO, READ BACK BY AND VERIFIED WITH: Dion Body PHARMD 2028 12/22/20 A BROWNING    Staphylococcus epidermidis  NOT DETECTED NOT DETECTED Final   Staphylococcus lugdunensis NOT DETECTED NOT DETECTED Final   Streptococcus species NOT DETECTED NOT DETECTED Final   Streptococcus agalactiae NOT DETECTED NOT DETECTED Final   Streptococcus pneumoniae NOT DETECTED NOT DETECTED Final   Streptococcus pyogenes NOT DETECTED NOT DETECTED Final   A.calcoaceticus-baumannii NOT DETECTED NOT DETECTED Final   Bacteroides fragilis NOT DETECTED NOT DETECTED Final   Enterobacterales NOT DETECTED NOT DETECTED Final   Enterobacter cloacae complex NOT DETECTED NOT DETECTED Final   Escherichia coli NOT DETECTED NOT DETECTED Final   Klebsiella aerogenes NOT DETECTED NOT DETECTED Final   Klebsiella oxytoca NOT DETECTED NOT DETECTED Final   Klebsiella pneumoniae NOT DETECTED NOT DETECTED Final   Proteus species NOT DETECTED NOT DETECTED Final   Salmonella species NOT DETECTED NOT DETECTED Final   Serratia marcescens NOT DETECTED NOT DETECTED Final   Haemophilus influenzae NOT DETECTED NOT DETECTED Final   Neisseria meningitidis NOT DETECTED NOT DETECTED Final   Pseudomonas aeruginosa NOT DETECTED NOT DETECTED Final   Stenotrophomonas maltophilia NOT DETECTED NOT DETECTED Final   Candida albicans NOT DETECTED NOT DETECTED Final   Candida auris NOT DETECTED NOT DETECTED Final   Candida glabrata NOT DETECTED NOT DETECTED Final   Candida krusei NOT DETECTED NOT DETECTED Final   Candida parapsilosis NOT DETECTED NOT DETECTED Final   Candida tropicalis NOT DETECTED NOT DETECTED Final   Cryptococcus neoformans/gattii NOT DETECTED NOT DETECTED Final   Meth resistant mecA/C and MREJ DETECTED (A) NOT DETECTED Final    Comment: CRITICAL RESULT CALLED TO, READ BACK BY AND VERIFIED WITHDion Body [facility] 2028 12/22/20 A BROWNING Performed at [facility], [address] [place], [place] [address]   Blood Culture (routine x 2)     Status: Abnormal   Collection Time: 12/22/20  7:19 AM   Specimen: BLOOD  Result Value Ref Range  Status   Specimen Description BLOOD SITE NOT SPECIFIED  Final   Special Requests   Final    BOTTLES DRAWN AEROBIC AND ANAEROBIC Blood Culture results may not be optimal due to an excessive volume of blood received in culture bottles   Culture  Setup Time   Final    GRAM POSITIVE COCCI IN CLUSTERS IN BOTH AEROBIC AND ANAEROBIC BOTTLES IDENTIFICATION TO FOLLOW CRITICAL RESULT CALLED TO, READ BACK BY AND VERIFIED WITH: J MILLEN PHARMD 2028 12/22/20 A BROWNING    Culture (A)  Final    STAPHYLOCOCCUS AUREUS SUSCEPTIBILITIES PERFORMED ON PREVIOUS CULTURE WITHIN THE LAST 5 DAYS. Performed at [facility], [place] [address] [place], [place] [address]    Report Status 12/24/2020 FINAL  Final  Culture, blood (routine x 2)     Status: None (Preliminary result)   Collection Time: 12/24/20  3:10 PM   Specimen: BLOOD RIGHT HAND  Result Value Ref Range  Status   Specimen Description BLOOD RIGHT HAND  Final   Special Requests   Final    BOTTLES DRAWN AEROBIC ONLY Blood Culture adequate volume   Culture   Final    NO GROWTH 4 DAYS Performed at [facility], [address] [place], [place] [address]    Report Status PENDING  Incomplete  Culture, blood (routine x 2)     Status: None (Preliminary result)   Collection Time: 12/24/20  3:37 PM   Specimen: BLOOD  Result Value Ref Range Status   Specimen Description BLOOD BLOOD RIGHT FOREARM  Final   Special Requests   Final    BOTTLES DRAWN AEROBIC ONLY Blood Culture adequate volume   Culture   Final    NO GROWTH 4 DAYS Performed at [facility], [place] [address] [place], [place] [address]    Report Status PENDING  Incomplete  MRSA PCR Screening     Status: None   Collection Time: 12/25/20 10:18 PM   Specimen: Nasal Mucosa; Nasopharyngeal  Result Value Ref Range Status   MRSA by PCR NEGATIVE NEGATIVE Final    Comment:        The GeneXpert MRSA Assay (FDA approved for NASAL specimens only), is one component of a comprehensive  MRSA colonization surveillance program. It is not intended to diagnose MRSA infection nor to guide or monitor treatment for MRSA infections. Performed at [facility], [place] [address], [place], [place] [address]   Culture, blood (routine x 2)     Status: Abnormal (Preliminary result)   Collection Time: 12/26/20 10:19 AM   Specimen: BLOOD RIGHT HAND  Result Value Ref Range Status   Specimen Description BLOOD RIGHT HAND  Final   Special Requests   Final    BOTTLES DRAWN AEROBIC ONLY Blood Culture results may not be optimal due to an inadequate volume of blood received in culture bottles   Culture  Setup Time   Final    GRAM POSITIVE COCCI IN CLUSTERS AEROBIC BOTTLE ONLY CRITICAL RESULT CALLED TO, READ BACK BY AND VERIFIED WITH: PHARMD TM TUCKER [id number] AT 717 BY CM Performed at [facility], [place] [address] [place], [place] [address]    Culture STAPHYLOCOCCUS AUREUS (A)  Final   Report Status PENDING  Incomplete  Culture, blood (routine x 2)     Status: Abnormal (Preliminary result)   Collection Time: 12/26/20 10:19 AM   Specimen: BLOOD RIGHT WRIST  Result Value Ref Range Status   Specimen Description BLOOD RIGHT WRIST  Final   Special Requests   Final    BOTTLES DRAWN AEROBIC ONLY Blood Culture results may not be optimal due to an inadequate volume of blood received in culture bottles   Culture  Setup Time   Final    GRAM POSITIVE COCCI IN CLUSTERS AEROBIC BOTTLE ONLY CRITICAL RESULT CALLED TO, READ BACK BY AND VERIFIED WITH: PHARMD M TUCKER [id number] AT 16 BY CM    Culture (A)  Final    STAPHYLOCOCCUS AUREUS SUSCEPTIBILITIES TO FOLLOW Performed at [facility], [place] [address], [place], [place] [address]    Report Status PENDING  Incomplete  Culture, blood (routine x 2)     Status: None (Preliminary result)   Collection Time: 12/27/20  2:17 PM   Specimen: BLOOD  Result Value Ref Range Status   Specimen Description BLOOD THUMB  Final   Special Requests    Final    BOTTLES DRAWN AEROBIC AND  ANAEROBIC Blood Culture adequate volume   Culture   Final    NO GROWTH < 24 HOURS Performed at [facility], [place] [address], [place], [place] [address]    Report Status PENDING  Incomplete  Culture, blood (routine x 2)     Status: None (Preliminary result)   Collection Time: 12/27/20  2:17 PM   Specimen: BLOOD RIGHT HAND  Result Value Ref Range Status   Specimen Description BLOOD RIGHT HAND  Final   Special Requests   Final    BOTTLES DRAWN AEROBIC AND ANAEROBIC Blood Culture adequate volume   Culture   Final    NO GROWTH < 12 HOURS Performed at [facility], [place] [address], [place], [place] [address]    Report Status PENDING  Incomplete    Pertinent Lab. CBC Latest Ref Rng & Units 12/28/2020 12/27/2020 12/26/2020  WBC 4.0 - 10.5 K/uL 18.8(H) 20.4(H) 23.8(H)  Hemoglobin 13.0 - 17.0 g/dL 9.6(L) 9.2(L) 9.7(L)  Hematocrit 39.0 - 52.0 % 32.4(L) 30.7(L) 32.3(L)  Platelets 150 - 400 K/uL 337 330 315   CMP Latest Ref Rng & Units 12/28/2020 12/27/2020 12/26/2020  Glucose 70 - 99 mg/dL 95 96 107(H)  BUN 6 - 20 mg/dL 25(H) 48(H) 37(H)  Creatinine 0.61 - 1.24 mg/dL 4.94(H) 7.45(H) 5.95(H)  Sodium 135 - 145 mmol/L 134(L) 132(L) 133(L)  Potassium 3.5 - 5.1 mmol/L 3.6 3.3(L) 3.4(L)  Chloride 98 - 111 mmol/L 94(L) 95(L) 95(L)  CO2 22 - 32 mmol/L '27 22 26  '$ Calcium 8.9 - 10.3 mg/dL 9.1 9.4 9.2  Total Protein 6.5 - 8.1 g/dL 8.3(H) 8.7(H) 8.4(H)  Total Bilirubin 0.3 - 1.2 mg/dL 0.8 1.5(H) 1.1  Alkaline Phos 38 - 126 U/L 87 71 76  AST 15 - 41 U/L '25 22 25  '$ ALT 0 - 44 U/L '10 10 9     '$ Pertinent Imaging today Plain films and CT images have been personally visualized and interpreted; radiology reports have been reviewed. Decision making incorporated into the Impression / Recommendations.  TEE 12/24/20 1. Left ventricular ejection fraction, by estimation, is 60 to 65%. The left ventricle has normal function. The left ventricle has no regional wall  motion abnormalities. There is moderate left ventricular hypertrophy. 2. Right ventricular systolic function is normal. The right ventricular size is severely enlarged. Mildly increased right ventricular wall thickness. 3. No left atrial/left atrial appendage thrombus was detected. 4. Right atrial size was severely dilated. 5. The mitral valve has been repaired/replaced. Trivial mitral valve regurgitation. There is a bioprosthetic valve present in the mitral position. Echo findings are consistent with vegetation the mitral prosthesis. 6. Aortic valve vegetation is visualized on the left. 7. The aortic valve is tricuspid. Aortic valve regurgitation is not visualized. 8. There is mild (Grade II) plaque. Conclusion(s)/Recommendation(s): Findings are concerning for vegetation/infective endocarditis as detailed above.  I have spent more than 35 minutes for this patient encounter including review of prior medical records, coordination of care  with greater than 50% of time being face to face/counseling and discussing diagnostics/treatment plan with the patient/family.  Electronically signed by:   Rosiland Oz, MD Infectious Disease Physician [facility] Pager: [phone]

## 2020-12-28 NOTE — Progress Notes (Signed)
Yellow MEWS @ [id number]. Charge nurse aware. Will continue to monitor patient.

## 2020-12-28 NOTE — Consult Note (Signed)
Consultation Note Date: 12/28/2020   Patient Name: Patrick Anthony  DOB: [birth year]  MRN: [id number]  Age / Sex: 50 y.o., male  PCP: Patrick Lei, MD Referring Physician: Kerney Elbe, DO  Reason for Consultation: Establishing goals of care and Psychosocial/spiritual support  HPI/Patient Profile: 50 y.o. 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 Sharol Anthony on 10/29/2020 and other comorbidities.   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  After discharge [facility] was [facility] s/p AKA and continued antibiotics.   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  Palliative care was consulted for goals of care and psychosocial/spiritual support.  Clinical Assessment and Goals of Care:   This  NP [facility] along with Patrick Field NP reviewed medical records, received report from team, assessed the patient and then meet at the patient's bedside to discuss diagnosis, prognosis, GOC, EOL wishes disposition, and options.   Concept of Palliative Care was introduced as specialized medical care for people and their families living with serious illness.  If focuses on providing relief from the symptoms and stress of a serious illness. The goal is to improve quality of life for  both the patient and the family.    Values and goals of care important to patient and family were attempted to be elicited.  Created space and opportunity for patient to explore thoughts and feelings regarding current medical situation. Clarification of medical status as of now was provided.    A discussion was had today regarding advanced directives. Concepts specific to code status, artifical feeding and hydration, continued IV antibiotics and rehospitalization was had.   If review of the patient's life and things that matter to him was undertaken.  It is clear that he lives for his grandchildren, 2 granddaughters.  He wants to be around to provide them with him and guidance as they grow older.  This seems to be of the utmost importance to him.  MOST form re-introduced and reviewed. Advance directive also reviewed and updated.  He confirms his daughter Patrick Anthony is his primary healthcare power of attorney, his daughter Patrick Anthony is back up power of attorney.  He confirms that he wants full code, aggressive care and treatment to prolong life including long-term feeding tube if necessary.  He accepts that his things change he may need to plan for other options.   Questions and concerns addressed. Patient  encouraged to call with questions or concerns.     PMT will continue to support holistically.   Primary Decision Maker: PATIENT   Patient does have ACP documents, see [facility]   SUMMARY OF RECOMMENDATIONS    Code Status/Advance Care Planning:  Full code  Palliative Prophylaxis:   Frequent Pain Assessment  Additional Recommendations (Limitations, Scope, Preferences):  Full Scope Treatment   Prefers to eventually return to home/apartment and as much of a "normal life" as possible  Current apartment is 2nd floor/stairs which may not  be feesible for him s/p AKA  Psycho-social/Spiritual:   Desire for further Chaplaincy support:no  Prognosis:   Unable to determine  Discharge  Planning: To Be Determined      Primary Diagnoses: Present on Admission: . SIRS (systemic inflammatory response syndrome) (HCC) . Personal history of Methicillin resistant Staphylococcus aureus infection . Anemia in chronic kidney disease . Hepatitis C . Bacteremia due to Staphylococcus aureus   I have reviewed the medical record, interviewed the patient and family, and examined the patient. The following aspects are pertinent.  Past Medical History:  Diagnosis Date  . 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 Chronic kidney disease    patient on dialysis tues-thurs-sat  . Cirrhosis of liver ([place])   . Drug addiction ([place])   . ETOH abuse   . Hepatitis   . Hypertension   . Mitral regurgitation   . Patent foramen ovale 10/23/2019  . Renal disorder renal failure  . S/P minimally-invasive mitral valve replacement with bioprosthetic valve 10/14/2020   31 mm Medtronic Mosaic stented porcine bioprosthetic tissue valve via right mini thoracotomy approach  . Sleep apnea   . Systolic and diastolic CHF, 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[place]) 04/19/2013   Social History   Socioeconomic History  . Marital status: Single    Spouse name: Not on file  . Number of children: Not on file  . Years of education: Not on file  . Highest education level: Not on file  Occupational History  . Not on file  Tobacco Use  . Smoking status: Former Smoker    Packs/day: 0.20    Types: Cigarettes    Quit date: 10/05/2017    Years since quitting: 3.2  . Smokeless tobacco: Never Used  . Tobacco comment: Quit 3 years ago  Vaping Use  . Vaping Use: Never used  Substance and Sexual Activity  . Alcohol use: No  . Drug use: No    Comment: Quit  . Sexual activity: Not Currently    Birth control/protection: Condom  Other Topics Concern  . Not on file  Social History Narrative   ** Merged History Encounter **       Social  Determinants of Health   Financial Resource Strain: Not on file  Food Insecurity: Not on file  Transportation Needs: Not on file  Physical Activity: Not on file  Stress: Not on file  Social Connections: Not on file   Family History  Problem Relation Age of Onset  . Hypertension Mother   . Cancer Mother        breast   Scheduled Meds: . (feeding supplement) PROSource Plus  30 mL Oral BID BM  . Chlorhexidine Gluconate Cloth  6 each Topical Q0600  . cinacalcet  60 mg Oral Q supper  . diltiazem  30 mg Oral Q6H  . docusate sodium  100 mg Oral BID  . feeding supplement (NEPRO CARB STEADY)  237 mL Oral TID BM  . methocarbamol  500 mg Oral TID  . multivitamin with minerals  1 tablet Oral Daily  . oxyCODONE  15 mg Oral Q12H  . pantoprazole  40 mg Oral Daily  . pregabalin  75 mg Oral QHS  . senna-docusate  1 tablet Oral BID  . sodium chloride flush  3 mL Intravenous Q12H   Continuous Infusions: . gentamicin 80 mg (12/27/20 1751)  . vancomycin Stopped (12/27/20 1214)  PRN Meds:.acetaminophen **OR** acetaminophen, albuterol, bisacodyl, ondansetron **OR** ondansetron (ZOFRAN) IV, polyethylene glycol Medications Prior to Admission:  Prior to Admission medications   Medication Sig Start Date End Date Taking? Authorizing Provider  acetaminophen (TYLENOL) 325 MG tablet Take 650 mg by mouth every 6 (six) hours as needed for mild pain. Temp > 100.5   Yes [provider]  aspirin EC 325 MG EC tablet Take 1 tablet (325 mg total) by mouth daily. 11/10/20  Yes Samella Parr, NP  bisacodyl (DULCOLAX) 10 MG suppository Place 1 suppository (10 mg total) rectally daily as needed for moderate constipation. 11/09/20  Yes Samella Parr, NP  calcium acetate (PHOSLO) 667 MG capsule Take 1 capsule (667 mg total) by mouth 3 (three) times daily with meals. 11/09/20  Yes Samella Parr, NP  cinacalcet (SENSIPAR) 30 MG tablet Take 2 tablets (60 mg total) by mouth daily with breakfast. 11/10/20  Yes  Samella Parr, NP  cloNIDine (CATAPRES) 0.2 MG tablet Take 0.2 mg by mouth 2 (two) times daily.   Yes [provider]  docusate sodium (COLACE) 100 MG capsule Take 1 capsule (100 mg total) by mouth 2 (two) times daily. 11/09/20  Yes Samella Parr, NP  HYDROmorphone (DILAUDID) 2 MG tablet Take 1 tablet (2 mg total) by mouth every 4 (four) hours as needed for severe pain (Not controlled by oxycontin). 11/09/20  Yes Samella Parr, NP  methocarbamol (ROBAXIN) 500 MG tablet Take 1 tablet (500 mg total) by mouth 3 (three) times daily. 11/09/20  Yes Samella Parr, NP  metoprolol tartrate (LOPRESSOR) 50 MG tablet Take 1 tablet (50 mg total) by mouth 2 (two) times daily. 11/09/20  Yes Samella Parr, NP  Multiple Vitamin (MULTIVITAMIN WITH MINERALS) TABS tablet Take 1 tablet by mouth daily.   Yes [provider]  nitroGLYCERIN (NITROSTAT) 0.4 MG SL tablet Place 0.4 mg under the tongue every 5 (five) minutes x 3 doses as needed for chest pain.    Yes [provider]  Nutritional Supplements (,FEEDING SUPPLEMENT, PROSOURCE PLUS) liquid Take 30 mLs by mouth 2 (two) times daily between meals. 11/09/20  Yes Samella Parr, NP  Nutritional Supplements (FEEDING SUPPLEMENT, NEPRO CARB STEADY,) LIQD Take 237 mLs by mouth 3 (three) times daily between meals. 11/09/20  Yes Samella Parr, NP  oxyCODONE (OXYCONTIN) 15 mg 12 hr tablet Take 1 tablet (15 mg total) by mouth every 12 (twelve) hours. 11/09/20  Yes Samella Parr, NP  pantoprazole (PROTONIX) 40 MG tablet Take 1 tablet (40 mg total) by mouth daily. 11/10/20  Yes Samella Parr, NP  polyethylene glycol (MIRALAX / GLYCOLAX) 17 g packet Take 17 g by mouth daily as needed for mild constipation. 11/09/20  Yes Samella Parr, NP  pregabalin (LYRICA) 75 MG capsule Take 1 capsule (75 mg total) by mouth at bedtime. 11/09/20  Yes Samella Parr, NP  senna-docusate (SENOKOT-S) 8.6-50 MG tablet Take 1 tablet by mouth 2 (two) times daily.  11/09/20  Yes Samella Parr, NP  sevelamer carbonate (RENVELA) 800 MG tablet Take 1,600 mg by mouth 3 (three) times daily with meals.   Yes [provider]  vancomycin 750 mg in sodium chloride 0.9 % 150 mL Inject 750 mg into the vein every Monday, Wednesday, and Friday with hemodialysis.   Yes [provider]  Darbepoetin Alfa (ARANESP, ALBUMIN FREE, IJ) Darbepoetin Alfa (Aranesp) 07/05/20 07/04/21  [provider]   No Known Allergies Review of  Systems  Constitutional: Positive for activity change and fever.  Musculoskeletal: Positive for back pain.  Neurological:       Temporary blurred vision, now resolved    Physical Exam Vitals and nursing note reviewed.  Cardiovascular:     Rate and Rhythm: Tachycardia present.  Pulmonary:     Effort: Pulmonary effort is normal. No respiratory distress.  Musculoskeletal:     Comments: Right AKA noted  Neurological:     General: No focal deficit present.     Mental Status: He is alert.  Psychiatric:        Mood and Affect: Mood normal.        Behavior: Behavior normal.        Thought Content: Thought content normal.     Vital Signs: BP (!) 138/100 (BP Location: Right Arm)   Pulse (!) 107   Temp 97.6 F (36.4 C) (Oral)   Resp 16   Ht 6' (1.829 m)   Wt 71.1 kg   SpO2 94%   BMI 21.26 kg/m  Pain Scale: 0-10   Pain Score: Asleep   SpO2: SpO2: 94 % O2 Device:SpO2: 94 % O2 Flow Rate: .O2 Flow Rate (L/min): 2 L/min  IO: Intake/output summary:   Intake/Output Summary (Last 24 hours) at 12/28/2020 [id number] Last data filed at 12/27/2020 2200 Gross per 24 hour  Intake 890 ml  Output 3500 ml  Net -2610 ml    LBM: Last BM Date: 12/25/20 Baseline Weight: Weight:  (unable to weigh, in ER stretcher) Most recent weight: Weight: 71.1 kg     Palliative Assessment/Data:  50 %   Discussed with Dr Alfredia Ferguson   Time In: 0845 Time Out: 10:00 Time Total: 75 minutes Greater than 50%  of this time was spent  counseling and coordinating care related to the above assessment and plan.  Signed by: Patrick Lessen, NP   Please contact Palliative Medicine Team phone at [phone] for questions and concerns.  For individual provider: See Shea Evans

## 2020-12-28 NOTE — Progress Notes (Signed)
[facility] Progress Note   Dialysis Orders: MWF -[facility] 4hrs, [id number], DFR500, EDW 72kg,2K/2CaProfile 2  Access:LU AVG vs BCF Heparin5500 unit qHD Mircera214mg q2wks - last 5/9 Venofer '100mg'$  qHD x5 - 1 completed  Assessment/ Plan:   1. 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  2. 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 WBCs 28 -> 20.4. On  Vancomycin and Gent per pharmacy. s/p repeat TEE with MV vegetations.  MRI thoracic spine -> concerning for osteo/discitis T10/11.  Per primary team. ID following.  3. Back pain -MRI lumbar spine with no discitis/osteomyelitis, +degenerative disc disease at L4-L5, L5-S1. ID plans for MRI T spine. 4. Hypoxia-resolved.  Now on RA.  5. ESRD- On HD MWF. Continue on schedule.  HD 5/23 net UF 3.5L  Next HD 5/24  6. Hypertension/volume-BP elevated. Resume home meds. Under dry weight if weights correct. UF has been limited by hypotension. Tolerated 3L UF on Friday and 3.5L Mon 7. Anemiaof CKD- Hgb 9.7- Next ESA dose due 5/23. .Marland KitchenHold IV iron due to acute infection. 8. Secondary Hyperparathyroidism -Ca in goal.Phos low - binders stopped -> phos 3 5/23. Continue sensipar '60mg'$  qd. Not on VDRA. 9. Nutrition- Renal diet w/fluid restriction. Protein supplement.   Subjective:   No new events. No complaints this am. No cp, sob, fever, chills today.  He feels that breathing is better; no cramping w/ hd yest On 2L O2 overnight   Objective:   BP (!) 133/91 (BP Location: Right Arm)   Pulse (!) 116   Temp (!) 100.4 F (38 C) (Oral)   Resp 15   Ht 6' (1.829 m)   Wt 71.1 kg   SpO2 94%   BMI 21.26 kg/m   Intake/Output Summary (Last 24 hours) at 12/28/2020 0719 Last data filed at 12/27/2020 2200 Gross per 24 hour  Intake 890 ml  Output 3500 ml  Net -2610 ml   Weight  change: -1.3 kg  Physical Exam: General: alert, oriented x3, male patient in NAD, sitting on side of  bed Heart: Tachy, regular, no mrg Lungs: clear bilaterally, occasional rhonchi  Abdomen:soft, NTND Extremities: R AKA, no edema Dialysis Access: LU BCF w/ bypass graft strong bruit  Imaging: No results found.  Labs: BMET Recent Labs  Lab 12/22/20 0714 12/23/20 0119 12/24/20 0347 12/25/20 0056 12/26/20 0117 12/27/20 0218 12/28/20 0222  NA 133* 135 133*  133* 137 133* 132* 134*  K 4.4 3.4* 3.9  3.9 3.3* 3.4* 3.3* 3.6  CL 92* 95* 92*  92* 98 95* 95* 94*  CO2 '25 25 27  27 24 26 22 27  '$ GLUCOSE 82 66* 105*  106* 90 107* 96 95  BUN 56* 29* 43*  42* 26* 37* 48* 25*  CREATININE 8.20* 4.91* 6.91*  6.83* 4.78* 5.95* 7.45* 4.94*  CALCIUM 8.8* 8.4* 9.1  9.2 8.7* 9.2 9.4 9.1  PHOS  --  1.8* 1.8*  1.8* 1.5* 2.4* 3.0 2.3*   CBC Recent Labs  Lab 12/25/20 0056 12/26/20 0117 12/27/20 0218 12/28/20 0222  WBC 28.9* 23.8* 20.4* 18.8*  NEUTROABS 23.7* 19.2* 15.3* 14.1*  HGB 10.0* 9.7* 9.2* 9.6*  HCT 32.8* 32.3* 30.7* 32.4*  MCV 80.6 79.2* 78.9* 80.0  PLT 262 315 330 337    Medications:    . (feeding supplement) PROSource Plus  30 mL Oral BID BM  .  Chlorhexidine Gluconate Cloth  6 each Topical Q0600  . cinacalcet  60 mg Oral Q supper  . diltiazem  30 mg Oral Q6H  . docusate sodium  100 mg Oral BID  . feeding supplement (NEPRO CARB STEADY)  237 mL Oral TID BM  . methocarbamol  500 mg Oral TID  . multivitamin with minerals  1 tablet Oral Daily  . oxyCODONE  15 mg Oral Q12H  . pantoprazole  40 mg Oral Daily  . pregabalin  75 mg Oral QHS  . senna-docusate  1 tablet Oral BID  . sodium chloride flush  3 mL Intravenous Q12H      Otelia Santee, MD 12/28/2020, 7:19 AM

## 2020-12-28 NOTE — Progress Notes (Signed)
PROGRESS NOTE    Patrick Anthony  [id number] DOB: 05-01-1971 DOA: 12/22/2020 PCP: [name], MD   Brief Narrative:  The patient is a 69 atrial African-American male with a past medical history significant for but not limited to end-stage renal disease on hemodialysis on Monday Wednesday Friday, chronic diastolic CHF, hyperlipidemia, history of liver cirrhosis secondary to hepatitis C, history of alcohol abuse, OSA as well as recent AKA done by Dr. Sharol Given on 10/29/2020 and other comorbidities who presented with a chief complaint of spiking fevers for last few days and then altered mental status.  Reportedly patient was at his facility when they were noticing that he was altered and history is difficult to be obtained from the patient himself.  Of note he has had a prolonged hospitalization from 09/27/20 until November 11, 2019 he was found to have MRSA bacteremia as well as mitral valve endocarditis with PFO and septic emboli to the brain along with that time.  He underwent a bioprosthetic mitral valve replacement along with closure of the PFO on 10/14/2020.  Also during that hospitalization he was treated for osteomyelitis and abscess of the right tibia status post AKA on 10/29/2020 by Dr. Sharol Given.  He was discharged to [facility] for continuation of vancomycin at the nursing facility until 11/25/2020.  Reportedly he is usually alert and oriented x4.  Yesterday he is only alert and oriented to person and place but not the situation.  He complained about severe pain in his back has been present for some time and he describes shooting pains in the right leg but was amputated.  He denies any shortness breath or cough.  In the ED he presented with a fever of 103 and rales were reported in the lower lung field on the right lung.  Upon arrival to the ED he was noted to be septic given that he had an elevated temperature, respirations as well as being tachycardia and had a new O2 requirement as well as being altered.   CT scan of the brain showed no acute abnormalities but did show several changes that were chronic injury to the areas of infarct noted on the previous MRI.  His WBC was elevated 20,200.  Chest x-ray showed scarring in the right upper lobe, middle lobe atelectasis and a small left-sided pleural effusion stable cardiomegaly.  Initial sepsis protocol was initiated and he was given IV vancomycin and cefepime.  Further work-up reveals that he has another MRSA bacteremia and infectious diseases is involved again and nephrology is involved as well.  Patient is to get a repeat TEE and this was done today 12/24/20 to evaluate his mitral valve. Still pending to get a MRI the T-spine with and without contrast with hemodialysis after.  UnFortunately a TEE shows likely bioprosthetic valve infection so infectious disease is now going to add gentamicin and rifampin once his cultures are cleared.  We will still pursue his thoracic spine MRI which is still pending to be done.  Overnight the patient developed blurred vision and he underwent a head MRI which showed recurrent cardioembolic strokes in the setting of septic emboli from infective endocarditis.  Neurology was consulted and they did not recommend further work-up given that he recently just had an MRI of the brain with and without contrast on 09/28/2020 as well as an echo and carotid Dopplers.  Neurology recommending holding antiplatelets given the risk of hemorrhagic conversion of recurrent infective endocarditis related strokes and resume in 1 week if patient remains stable.  Patient's aspirin was then discontinued.  MRI of the spine shows early discitis-osteomyelitis at the T10-T11 level with mild perivertebral soft tissue edema at this level and no evidence of epidural fluid collection or abscess.  There is also moderate endplate marrow edema centered at C6-7 intervertebral disc space similar to previous MRI and small bilateral pleural effusion.  Cardiothoracic  surgery evaluated and they feel that he is a candidate for redo mitral valve replacement and aortic valve replacement 3 months following previous surgery as they do not feel that he would survive this.  They recommend continuing antibiotic therapy.  On the morning of 12/26/20 he went into SVT and heart rates spiked in the 150s and he was given at 250 mL bolus, tried vagal maneuvers which did not break his rhythm and he was given Tylenol for his temperature of 102.8.  Cardiology was consulted and he was given adenosine 6 mg and then it looked like 2-1 flutter and he was placed on a diltiazem drip and bolus.  Cardiology feels that he cannot be anticoagulated this time given his risk for hemorrhagic conversion.  Further review of telemetry showed that his heart rate may just represent sinus tachycardia and as there is no clear flutter waves and now cardiology transition him to oral diltiazem.  He Continues to spike temperatures and next hemodialysis session per nephrology.  Palliative care consulted and current goals of care discussion is still aggressive and full care p.o. diltiazem to 120 mg daily  Assessment & Plan:   Principal Problem:   Bacteremia due to Staphylococcus aureus Active Problems:   Anemia in chronic kidney disease   Hepatitis C   ESRD on dialysis (HCC)   S/P mitral valve replacement   S/P AKA (above knee amputation), right (HCC)   Personal history of Methicillin resistant Staphylococcus aureus infection   SIRS (systemic inflammatory response syndrome) (HCC)  Severe sepsis secondary to an MRSA bacteremia and Prostetic Valve Endocarditis present on admission -Presented the fever up to 103 F, tachycardia, tachypnea, a new oxygen requirement, altered mental status as well as a leukocytosis of 20,200 and also with hypotension -Lactic acid level was reassuring at 1.7 on admission -Chest x-ray on admission only noted low lung volumes and possible signs of atelectasis with no clear  source of infection appreciated at that time -Blood cultures now revealing an MRSA bacteremia; Repeat Blood Cx showed no growth to date but will obtain another set today given that he became febrile and tachycardic -He has been admitted progressive bed -He was initiated on IV antibiotics with vancomycin and cefepime but cefepime has now been discontinued -Infectious diseases has been consulted and plan further work-up for the patient and have ordered a repeat TEE and discussed with cardiology given his prostatic mitral valve and also will be getting a T-spine MRI with and without contrast with dialysis to be done afterwards; MRI done and showed that he has early changes of discitis and osteomyelitis at the T10-T11 level -Patient did get TEE which showed "Possible small vegetations associated with the bioprosthetic mitral valve and native aortic valve. No LAA thrombus. Negative for PFO. Moderate LVH Severe RA/RV dilitation with mild TR LVEF 60-65%." -We will be continuing vancomycin but because of Concern for possible Valvular Vegatations including a small, moblile Filamentous Structure on the posterior bioprosthetic vavlve leaflet was concerning for vegetation, ID now recommending PVE Coverage and will start Gentamicin and Add Rifampin once CX's Clear -procalcitonin level was 28.50 -> 28.84 -> 23.98 -CRP was 17.2 -Repeat  blood cultures have been planned for 12/24/2020 -Patient continues to have persistent sepsis physiology as he continues to spike temperatures and becomes tachycardic and tachypneic in the setting of his bacteremia -Appreciate further ID evaluation recommendations; sepsis physiology was improving but patient continues to still spike Temperatures and becomes intermittently tachycardic and tachypenic  -Patient's WBC minimally improved and went from 20.2 -> 19.7 -> 21.1 -> 28.9 -> 23.8 -> 20.4 -> 18.8 -Now infectious diseases recommending referral to cardiothoracic surgery and continuing  gentamicin and vancomycin and adding rifampin later once blood cultures remain negative; cardiothoracic surgery feels that he is not a candidate for redoing his surgical intervention -Repeat Blood Cx's from 12/26/20 POSITIVE AGAIN but the ones drawn a few days ago showed no growth to date -Infectious diseases to repeat blood cultures on 12/27/2020 showing NGTD <24 Hours so far  -Given his high risk for further decompensation and poor prognosis we will consult palliative care for goals of care discussion and he wants to pursue aggressive Treament and FULL CODE -C/w Supportive Care and infectious diseases anticipates adding rifampin likely today for triple therapy  Acute CVAs -Patient has recurrent cardioembolic strokes in the setting of septic emboli from infective endocarditis -MRI of the brain overnight done and showed "Numerous punctate foci of abnormal diffusion restriction within both hemispheres in multiple vascular territories, consistent with acute infarcts.  Multiple scattered microhemorrhages in a nonspecific pattern." -Neurology was consulted and patient already had most of the stroke work-up done in the last few admissions and they did not recommend repeating echocardiogram as he had a TEE 520 confirming infective endocarditis -Neurology recommended holding antiplatelets at the time given risk of hemorrhagic conversion -DVT prophylaxis changed from heparin to SCDs and recommending resuming in 1 week -Neurology recommended continue telemetry monitoring and a 30-day event monitor on discharge if no arrhythmias were captured -They recommended blood pressure goal is normotensive and recommending and PT OT and SLP evaluation -Troponin was elevated and went to 774 and likely in the setting of demand ischemia in the setting of his acute illness with bacteremia and tachyarrhythmias; he did have normal coronary arteries in 10/2020 -We will need to continue monitor patient carefully  Arrhythmia/new  onset SVT -Patient was found to be in SVT this morning and tried vagal maneuvers without success He was given a beta-blocker early this morning as well with out any improvement.  He is also given 250 normal saline bolus -Cardiology was consulted and troponins were obtained and his troponin level was 613 and trended up to 774 -Cardiology evaluated and gave the patient 6 mg of adenosine which slowed his rhythm and it looked like a 2-1 a flutter block so they started him on diltiazem drip -Once his arrhythmia improved and his heart rate slowed down cardiology reviewed his telemetry and they felt that it was not a flutter and may just represent sinus tachycardia so the will transition to oral diltiazem. -Cardiology following appreciate evaluation and Dr. Gardiner Rhyme evaluated today and patient is currently in sinus tachycardia with rates in the 100 and has been switched to p.o. diltiazem 30 mg 4 times daily and now further consolidated to diltiazem 140 mg p.o. daily -It is unclear if he did have atrial flutter but he is not a good candidate for anticoagulation given his multiple cardioembolic events from his infective endocarditis.  Hypotension -In the setting of MRSA bacteremia his admission blood pressures were as low as 84/51 but have now improved -Improved with IV fluid resuscitation -Continue to monitor  blood pressures per protocol; blood pressures are still on the softer side but last blood pressure reading was 160/110 -His home blood pressure medications include clonidine 0.2 mg p.o. twice daily as well as metoprolol tartrate 50 mg p.o. twice daily which have both been held -Goal MAP bolus of at least 65 -Continue with IV fluid hydration as needed but will need to be judicious given that he is a dialysis patient  Acute metabolic encephalopathy in the setting of infection, improved  -He is noted to be alert and oriented x2 but was previously alert and oriented x4 -CT scan did not show any acute  abnormalities; he does have a prior history of mitral valve endocarditis with septic emboli to the brain and to the lung -He appears more appropriate today and appears closer to his baseline so we will hold off further investigation -Continue with neurochecks per protocol -See Above   ESRD on Hemodialysis Monday Wednesday  Secondary hyperparathyroidism hypokalemia -Patient has a history of poorly cystic kidney disease Normally dialyzes Monday Wednesday Friday -He was dialyzed following contrast yesterday and will be dialyzed again tomorrow after the MRI is done -Nephrology been consulted and appreciate further evaluation recommendations and they are planning for dialysis tomorrow first shift and he went early this AM -Patient's BUNs/creatinine went from 48/7.45 -> 25/4.94 -Given that patient's calcium is at goal and his phosphorus is low they will discontinue binders but they recommended continue Cinacalcet 60 mg p.o. 4 times daily; will discontinue sevelamer carbonate 600 g p.o. 3 times daily -Patient's potassium was 3.4 yesterday and 3.3 today and expect to be corrected in dialysis -Phos level is very low at 1.5 and will defer to Nephrology to replete  -Nephrology to dialyze the patient next on 12/27/2020 and the plan is for 4K 2.25 calcium left upper arm bath with 3.5 L net ultrafiltration  Hyponatremia -Mild. Na+ Went from 135 -> 133 -> 137 -> 133 -> 132 -> 134 -Expect to be corrected in Dialysis today -Continue to Monitor and Trend -Repeat CMP in the AM   Acute Respiratory Failure with Hypoxia, improved  -Was placed on 2 L supplemental oxygen via nasal cannula and does not wear supplemental oxygen at home -Chest x-ray yesterday showed "Lower lung volumes with atelectasis. Chronic cardiomegaly and right lung  Scarring." -Patient does have a history of MRSA bacteremia with septic emboli to the lungs -SpO2: 96 % O2 Flow Rate (L/min): 2 L/min -Continuous pulse oximetry maintain O2  saturations greater than 90% Continue supplemental oxygen via nasal cannula and wean O2 as tolerated -We will need a repeat chest x-ray and an ambulatory home O2 screen prior to discharge  History of MRSA bacteremia with mitral valve endocarditis and PFO and septic emboli to the brain and left lung -He previously completed antibiotics on 11/25/2020 but now is back with another bacteremia as above so is now going to be on IV Vancomycin, IV Gentamicin, and IV Rifampin once Cx's clear   Anemia of chronic kidney disease -Patient presented with a hemoglobin 9.4 g/dL which appears near his baseline; his hemoglobin/hematocrit yesterday is stable at 9.7/32.3 -> 9.2/30.7 -> 9.6/32.4 -check anemia panel in the a.m. -Continue to monitor for signs and symptoms of bleeding; no overt bleeding noted -Repeat CBC in the a.m.  Hypokalemia -Mild and improved -The patient is potassium is now 3.6 -Defer to nephrology to replete  -Continue monitor and replete as necessary and check magnesium level in the a.m. -Repeat CMP in the a.m.  Chronic Pain  Syndrome with Chronic Opiate Dependence -He reports chronic back pain as well as right lower extremity that was recently amputated -Resuming home regimen of OxyContin and hydromorphone as needed however given his back pain will need to rule out infection etiology and he will undergo an MRI with and without contrast in the morning  Status post right AKA -Recently had an AKA done in the last 2 months -Continue with Pregabalin 75 mg p.o. nightly  Liver cirrhosis secondary to chronic hepatitis C -His HCC quantitative was 1,750,000 on 09/29/2020 -Infectious diseases been consulted given his MRSA bacteremia again and appreciate further evaluation recommendations.  GERD -Continue with PPI with pantoprazole 40 mg p.o. daily  Hyperbilirubinemia -Patient's T Bili went from 1.1 -> 1.5 -> 0.8 -Continue to Monitor and Trend -Repeat CMP in the AM   DVT prophylaxis:  Heparin 5000 units subcu every 8 Code Status: FULL CODE  Family Communication: No family present at bedside but spoke to Daughter [place] via telephone Disposition Plan: Pending further clinical work-up and evaluation by infectious diseases as well as nephrology, Neurology, cardiology and TCTS  Status is: Inpatient  Remains inpatient appropriate because:Unsafe d/c plan, IV treatments appropriate due to intensity of illness or inability to take PO and Inpatient level of care appropriate due to severity of illness   Dispo: The patient is from: Home              Anticipated d/c is to: Home              Patient currently is not medically stable to d/c.   Difficult to place patient No  Consultants:   Infectious Diseases  Nephrology  Cardiology   Neurology  Cardiothoracic Surgery  Palliative Care   Procedures:  TEE done by Dr. Debara Pickett  Findings:  1. LEFT VENTRICLE: The left ventricular wall thickness is moderately increased.  The left ventricular cavity is normal in size. Wall motion is normal.  LVEF is 60-65%.  2. RIGHT VENTRICLE:  The right ventricle is severely dilated without any thrombus or masses.    3. LEFT ATRIUM:  The left atrium is normal in size without any thrombus or masses.  There is not spontaneous echo contrast ("smoke") in the left atrium consistent with a low flow state.  4. LEFT ATRIAL APPENDAGE:  The large left atrial appendage is free of any thrombus or masses. The appendage has single lobes. Pulse doppler indicates high flow in the appendage.  5. ATRIAL SEPTUM:  The atrial septum appears intact and is free of thrombus and/or masses.  There is no evidence for interatrial shunting by color doppler and saline microbubble.  6. RIGHT ATRIUM:  The right atrium is severely dilated in size and function without any thrombus or masses.  7. 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 There is  trivial regurgitation.  There is a  possible small mobile vegetation adherent to the posterior leaflet which appears filamentous, this could also be residual valve tissue or suture.   8. AORTIC VALVE:  The aortic valve is trileaflet, normal in structure and function with no regurgitation.  The valve was visualized with 2D/3D modes - there appears to be a small mobile density at the base of the left coronary cusp seen in the long-axis view on the outflow side of the leaflet, possibly consistent with vegetation.  9. TRICUSPID VALVE:  The tricuspid valve is normal in structure and function with Mild regurgitation.  There were no vegetations or stenosis  10.  PULMONIC VALVE:  The pulmonic valve is normal in structure and function with trivial regurgitation.  There were no vegetations or stenosis.   11. AORTIC ARCH, ASCENDING AND DESCENDING AORTA:  There was grade 2 Ron Parker et. Al, 1992) atherosclerosis of the ascending aorta, aortic arch, or proximal descending aorta.  12. PULMONARY VEINS: Anomalous pulmonary venous return was not noted.  13. PERICARDIUM: The pericardium appeared normal and non-thickened.  There is no pericardial effusion.  IMPRESSION:   1. 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  2. No LAA thrombus 3. Negative for PFO 4. Moderate LVH 5. Severe RA/RV dilitation with mild TR 6. LVEF 60-65%  RECOMMENDATIONS:    1. Consider longer course of antibiotics for possible valvular vegetations, including a small, mobile filamentous structure on the posterior bioprosthetic valve leaflet which is concerning for vegetation.  Antimicrobials:  Anti-infectives (From admission, onward)   Start     Dose/Rate Route Frequency Ordered Stop   12/27/20 1800  gentamicin (GARAMYCIN) IVPB 80 mg        80 mg 100 mL/hr over 30 Minutes Intravenous Every M-W-F (1800) 12/24/20 1612     12/27/20 0813  vancomycin (VANCOREADY) 750 MG/150ML IVPB  Status:  Discontinued        Note to Pharmacy: Louretta Shorten   : cabinet override      12/27/20 0813 12/27/20 0815   12/24/20 1700  gentamicin (GARAMYCIN) IVPB 120 mg        120 mg 200 mL/hr over 30 Minutes Intravenous  Once 12/24/20 1612 12/24/20 1910   12/24/20 1430  vancomycin (VANCOCIN) IVPB 750 mg/150 ml premix        750 mg 150 mL/hr over 60 Minutes Intravenous Every M-W-F (Hemodialysis) 12/24/20 1324     12/22/20 1201  vancomycin variable dose per unstable renal function (pharmacist dosing)  Status:  Discontinued         Does not apply See admin instructions 12/22/20 1202 12/24/20 1324   12/22/20 1200  vancomycin (VANCOREADY) IVPB 750 mg/150 mL  Status:  Discontinued        750 mg 150 mL/hr over 60 Minutes Intravenous Every M-W-F (Hemodialysis) 12/22/20 0845 12/22/20 1202   12/22/20 1200  ceFEPIme (MAXIPIME) 2 g in sodium chloride 0.9 % 100 mL IVPB  Status:  Discontinued        2 g 200 mL/hr over 30 Minutes Intravenous Every M-W-F (Hemodialysis) 12/22/20 0845 12/23/20 1450   12/22/20 0715  vancomycin (VANCOREADY) IVPB 1500 mg/300 mL        1,500 mg 150 mL/hr over 120 Minutes Intravenous  Once 12/22/20 0714 12/22/20 1034   12/22/20 0715  ceFEPIme (MAXIPIME) 2 g in sodium chloride 0.9 % 100 mL IVPB        2 g 200 mL/hr over 30 Minutes Intravenous  Once 12/22/20 0714 12/22/20 0806       Subjective: Seen and examined and he was feeling okay.  Denies any chest pain, lightheadedness or dizziness.  Heart rate is still tachycardic.  Had a temperature yesterday.  No other concerns or complaints at this time.  Objective: Vitals:   12/28/20 0451 12/28/20 0740 12/28/20 1535 12/28/20 1625  BP:  (!) 138/100 (!) 171/90 (!) 160/110  Pulse:  (!) 107 (!) 118 (!) 122  Resp:  '16 18 20  '$ Temp:  97.6 F (36.4 C) 100 F (37.8 C) 100.1 F (37.8 C)  TempSrc:  Oral Oral Oral  SpO2:  94% 91% 96%  Weight: 71.1 kg     Height:        Intake/Output Summary (Last 24 hours) at 12/28/2020 1712 Last data filed at 12/27/2020  2200 Gross per 24 hour  Intake 650 ml  Output --  Net 650 ml   Filed Weights   12/27/20 0720 12/27/20 1125 12/28/20 0451  Weight: 74.9 kg 71.4 kg 71.1 kg   Examination: Physical Exam:  Constitutional: The patient is a chronically ill-appearing African-American male currently in no acute distress Eyes: Lids and conjunctivae normal, sclerae anicteric  ENMT: External Ears, Nose appear normal. Grossly normal hearing.  Neck: Appears normal, supple, no cervical masses, normal ROM, no appreciable thyromegaly; no JVD Respiratory: Diminished to auscultation bilaterally with coarse breath sounds, no wheezing, rales, rhonchi or crackles. Normal respiratory effort and patient is not tachypenic. No accessory muscle use.  Unlabored breathing Cardiovascular: Tachycardic rate but appears to be regular rhythm, murmur from his prosthetic valve. S1 and S2 auscultated.  Has mild lower extremity edema left leg Abdomen: Soft, non-tender, mildly distended.  Bowel sounds positive.  GU: Deferred. Musculoskeletal: No clubbing / cyanosis of digits/nails.  Has a right AKA Skin: No rashes, lesions, ulcers on limited skin evaluation. No induration; Warm and dry.  Neurologic: CN 2-12 grossly intact with no focal deficits. Romberg sign and cerebellar reflexes not assessed.  Psychiatric: Normal judgment and insight. Alert and oriented x 3. Normal mood and appropriate affect.   Data Reviewed: I have personally reviewed following labs and imaging studies  CBC: Recent Labs  Lab 12/24/20 0347 12/25/20 0056 12/26/20 0117 12/27/20 0218 12/28/20 0222  WBC 21.1* 28.9* 23.8* 20.4* 18.8*  NEUTROABS 16.9* 23.7* 19.2* 15.3* 14.1*  HGB 9.7* 10.0* 9.7* 9.2* 9.6*  HCT 32.0* 32.8* 32.3* 30.7* 32.4*  MCV 80.8 80.6 79.2* 78.9* 80.0  PLT 230 262 315 330 [id number]   Basic Metabolic Panel: Recent Labs  Lab 12/24/20 0347 12/25/20 0056 12/26/20 0117 12/27/20 0218 12/28/20 0222  NA 133*  133* 137 133* 132* 134*  K 3.9  3.9  3.3* 3.4* 3.3* 3.6  CL 92*  92* 98 95* 95* 94*  CO2 '27  27 24 26 22 27  '$ GLUCOSE 105*  106* 90 107* 96 95  BUN 43*  42* 26* 37* 48* 25*  CREATININE 6.91*  6.83* 4.78* 5.95* 7.45* 4.94*  CALCIUM 9.1  9.2 8.7* 9.2 9.4 9.1  MG 1.9 2.0 2.2 2.3 2.1  PHOS 1.8*  1.8* 1.5* 2.4* 3.0 2.3*   GFR: Estimated Creatinine Clearance: 18.2 mL/min (A) (by C-G formula based on SCr of 4.94 mg/dL (H)). Liver Function Tests: Recent Labs  Lab 12/24/20 0347 12/25/20 0056 12/26/20 0117 12/27/20 0218 12/28/20 0222  AST '21 22 25 22 25  '$ ALT '10 10 9 10 10  '$ ALKPHOS 79 81 76 71 87  BILITOT 1.0 1.2 1.1 1.5* 0.8  PROT 8.6* 8.5* 8.4* 8.7* 8.3*  ALBUMIN 2.2*  2.2* 2.2* 2.2* 2.1* 2.2*   No results for input(s): LIPASE, AMYLASE in the last 168 hours. No results for input(s): AMMONIA in the last 168 hours. Coagulation Profile: Recent Labs  Lab 12/22/20 0714  INR 1.3*   Cardiac Enzymes: No results for input(s): CKTOTAL, CKMB, CKMBINDEX, TROPONINI in the last 168 hours. BNP (last 3 results) No results for input(s): PROBNP in the last 8760 hours. HbA1C: No results for input(s): HGBA1C in the last 72 hours. CBG: Recent Labs  Lab 12/22/20 1402 12/22/20 1804  GLUCAP 83 82   Lipid Profile: No results for input(s):  CHOL, HDL, LDLCALC, TRIG, CHOLHDL, LDLDIRECT in the last 72 hours. Thyroid Function Tests: No results for input(s): TSH, T4TOTAL, FREET4, T3FREE, THYROIDAB in the last 72 hours. Anemia Panel: No results for input(s): VITAMINB12, FOLATE, FERRITIN, TIBC, IRON, RETICCTPCT in the last 72 hours. Sepsis Labs: Recent Labs  Lab 12/22/20 0714 12/26/20 1019 12/27/20 0218 12/28/20 0222  PROCALCITON  --  28.50 28.84 23.98  LATICACIDVEN 1.7  --   --   --     Recent Results (from the past 240 hour(s))  Resp Panel by RT-PCR (Flu A&B, Covid) Nasopharyngeal Swab     Status: None   Collection Time: 12/22/20  7:14 AM   Specimen: Nasopharyngeal Swab; Nasopharyngeal(NP) swabs in vial transport  medium  Result Value Ref Range Status   SARS Coronavirus 2 by RT PCR NEGATIVE NEGATIVE Final    Comment: (NOTE) SARS-CoV-2 target nucleic acids are NOT DETECTED.  The SARS-CoV-2 RNA is generally detectable in upper respiratory specimens during the acute phase of infection. The lowest concentration of SARS-CoV-2 viral copies this assay can detect is 138 copies/mL. A negative result does not preclude SARS-Cov-2 infection and should not be used as the sole basis for treatment or other patient management decisions. A negative result may occur with  improper specimen collection/handling, submission of specimen other than nasopharyngeal swab, presence of viral mutation(s) within the areas targeted by this assay, and inadequate number of viral copies(<138 copies/mL). A negative result must be combined with clinical observations, patient history, and epidemiological information. The expected result is Negative.  Fact Sheet for Patients:  EntrepreneurPulse.com.au  Fact Sheet for Healthcare Providers:  IncredibleEmployment.be  This test is no t yet approved or cleared by the [place] FDA and  has been authorized for detection and/or diagnosis of SARS-CoV-2 by FDA under an Emergency Use Authorization (EUA). This EUA will remain  in effect (meaning this test can be used) for the duration of the COVID-19 declaration under Section 564(b)(1) of the Act, 21 U.S.C.section 360bbb-3(b)(1), unless the authorization is terminated  or revoked sooner.       Influenza A by PCR NEGATIVE NEGATIVE Final   Influenza B by PCR NEGATIVE NEGATIVE Final    Comment: (NOTE) The Xpert Xpress SARS-CoV-2/FLU/RSV plus assay is intended as an aid in the diagnosis of influenza from Nasopharyngeal swab specimens and should not be used as a sole basis for treatment. Nasal washings and aspirates are unacceptable for Xpert Xpress SARS-CoV-2/FLU/RSV testing.  Fact Sheet for  Patients: EntrepreneurPulse.com.au  Fact Sheet for Healthcare Providers: IncredibleEmployment.be  This test is not yet approved or cleared by the [place] FDA and has been authorized for detection and/or diagnosis of SARS-CoV-2 by FDA under an Emergency Use Authorization (EUA). This EUA will remain in effect (meaning this test can be used) for the duration of the COVID-19 declaration under Section 564(b)(1) of the Act, 21 U.S.C. section 360bbb-3(b)(1), unless the authorization is terminated or revoked.  Performed at [facility], [place] [address], [place], [place] [address]   Blood Culture (routine x 2)     Status: Abnormal   Collection Time: 12/22/20  7:14 AM   Specimen: BLOOD  Result Value Ref Range Status   Specimen Description BLOOD SITE NOT SPECIFIED  Final   Special Requests   Final    BOTTLES DRAWN AEROBIC AND ANAEROBIC Blood Culture adequate volume   Culture  Setup Time   Final    GRAM POSITIVE COCCI IN CLUSTERS IN BOTH AEROBIC AND ANAEROBIC BOTTLES CRITICAL RESULT  CALLED TO, READ BACK BY AND VERIFIED WITHDion Body [facility] 2028 12/22/20 A BROWNING Performed at [facility], [place] [address], [place], [place] [address]    Culture METHICILLIN RESISTANT STAPHYLOCOCCUS AUREUS (A)  Final   Report Status 12/24/2020 FINAL  Final   Organism ID, Bacteria METHICILLIN RESISTANT STAPHYLOCOCCUS AUREUS  Final      Susceptibility   Methicillin resistant staphylococcus aureus - MIC*    CIPROFLOXACIN 4 RESISTANT Resistant     ERYTHROMYCIN >=8 RESISTANT Resistant     GENTAMICIN <=0.5 SENSITIVE Sensitive     OXACILLIN >=4 RESISTANT Resistant     TETRACYCLINE <=1 SENSITIVE Sensitive     VANCOMYCIN 1 SENSITIVE Sensitive     TRIMETH/SULFA <=10 SENSITIVE Sensitive     CLINDAMYCIN <=0.25 SENSITIVE Sensitive     RIFAMPIN <=0.5 SENSITIVE Sensitive     Inducible Clindamycin NEGATIVE Sensitive     * METHICILLIN RESISTANT STAPHYLOCOCCUS AUREUS   Blood Culture ID Panel (Reflexed)     Status: Abnormal   Collection Time: 12/22/20  7:14 AM  Result Value Ref Range Status   Enterococcus faecalis NOT DETECTED NOT DETECTED Final   Enterococcus Faecium NOT DETECTED NOT DETECTED Final   Listeria monocytogenes NOT DETECTED NOT DETECTED Final   Staphylococcus species DETECTED (A) NOT DETECTED Final    Comment: CRITICAL RESULT CALLED TO, READ BACK BY AND VERIFIED WITH: J MILLEN PHARMD 2028 12/22/20 A BROWNING    Staphylococcus aureus (BCID) DETECTED (A) NOT DETECTED Final    Comment: Methicillin (oxacillin)-resistant Staphylococcus aureus (MRSA). MRSA is predictably resistant to beta-lactam antibiotics (except ceftaroline). Preferred therapy is vancomycin unless clinically contraindicated. Patient requires contact precautions if  hospitalized. CRITICAL RESULT CALLED TO, READ BACK BY AND VERIFIED WITH: Dion Body PHARMD 2028 12/22/20 A BROWNING    Staphylococcus epidermidis NOT DETECTED NOT DETECTED Final   Staphylococcus lugdunensis NOT DETECTED NOT DETECTED Final   Streptococcus species NOT DETECTED NOT DETECTED Final   Streptococcus agalactiae NOT DETECTED NOT DETECTED Final   Streptococcus pneumoniae NOT DETECTED NOT DETECTED Final   Streptococcus pyogenes NOT DETECTED NOT DETECTED Final   A.calcoaceticus-baumannii NOT DETECTED NOT DETECTED Final   Bacteroides fragilis NOT DETECTED NOT DETECTED Final   Enterobacterales NOT DETECTED NOT DETECTED Final   Enterobacter cloacae complex NOT DETECTED NOT DETECTED Final   Escherichia coli NOT DETECTED NOT DETECTED Final   Klebsiella aerogenes NOT DETECTED NOT DETECTED Final   Klebsiella oxytoca NOT DETECTED NOT DETECTED Final   Klebsiella pneumoniae NOT DETECTED NOT DETECTED Final   Proteus species NOT DETECTED NOT DETECTED Final   Salmonella species NOT DETECTED NOT DETECTED Final   Serratia marcescens NOT DETECTED NOT DETECTED Final   Haemophilus influenzae NOT DETECTED NOT DETECTED Final    Neisseria meningitidis NOT DETECTED NOT DETECTED Final   Pseudomonas aeruginosa NOT DETECTED NOT DETECTED Final   Stenotrophomonas maltophilia NOT DETECTED NOT DETECTED Final   Candida albicans NOT DETECTED NOT DETECTED Final   Candida auris NOT DETECTED NOT DETECTED Final   Candida glabrata NOT DETECTED NOT DETECTED Final   Candida krusei NOT DETECTED NOT DETECTED Final   Candida parapsilosis NOT DETECTED NOT DETECTED Final   Candida tropicalis NOT DETECTED NOT DETECTED Final   Cryptococcus neoformans/gattii NOT DETECTED NOT DETECTED Final   Meth resistant mecA/C and MREJ DETECTED (A) NOT DETECTED Final    Comment: CRITICAL RESULT CALLED TO, READ BACK BY AND VERIFIED WITHDion Body [facility] 2028 12/22/20 A BROWNING Performed at [facility], [address]  [place], [place] [address]   Blood Culture (routine x 2)     Status: Abnormal   Collection Time: 12/22/20  7:19 AM   Specimen: BLOOD  Result Value Ref Range Status   Specimen Description BLOOD SITE NOT SPECIFIED  Final   Special Requests   Final    BOTTLES DRAWN AEROBIC AND ANAEROBIC Blood Culture results may not be optimal due to an excessive volume of blood received in culture bottles   Culture  Setup Time   Final    GRAM POSITIVE COCCI IN CLUSTERS IN BOTH AEROBIC AND ANAEROBIC BOTTLES IDENTIFICATION TO FOLLOW CRITICAL RESULT CALLED TO, READ BACK BY AND VERIFIED WITH: J MILLEN PHARMD 2028 12/22/20 A BROWNING    Culture (A)  Final    STAPHYLOCOCCUS AUREUS SUSCEPTIBILITIES PERFORMED ON PREVIOUS CULTURE WITHIN THE LAST 5 DAYS. Performed at [facility], [place] [address] [place], [place] [address]    Report Status 12/24/2020 FINAL  Final  Culture, blood (routine x 2)     Status: None (Preliminary result)   Collection Time: 12/24/20  3:10 PM   Specimen: BLOOD RIGHT HAND  Result Value Ref Range Status   Specimen Description BLOOD RIGHT HAND  Final   Special Requests   Final    BOTTLES DRAWN AEROBIC ONLY Blood Culture  adequate volume   Culture   Final    NO GROWTH 4 DAYS Performed at [facility], [place] [address], [place], [place] [address]    Report Status PENDING  Incomplete  Culture, blood (routine x 2)     Status: None (Preliminary result)   Collection Time: 12/24/20  3:37 PM   Specimen: BLOOD  Result Value Ref Range Status   Specimen Description BLOOD BLOOD RIGHT FOREARM  Final   Special Requests   Final    BOTTLES DRAWN AEROBIC ONLY Blood Culture adequate volume   Culture   Final    NO GROWTH 4 DAYS Performed at [facility], [place] [address], [place], [place] [address]    Report Status PENDING  Incomplete  MRSA PCR Screening     Status: None   Collection Time: 12/25/20 10:18 PM   Specimen: Nasal Mucosa; Nasopharyngeal  Result Value Ref Range Status   MRSA by PCR NEGATIVE NEGATIVE Final    Comment:        The GeneXpert MRSA Assay (FDA approved for NASAL specimens only), is one component of a comprehensive MRSA colonization surveillance program. It is not intended to diagnose MRSA infection nor to guide or monitor treatment for MRSA infections. Performed at [facility], [place] [address], [place], [place] [address]   Culture, blood (routine x 2)     Status: Abnormal (Preliminary result)   Collection Time: 12/26/20 10:19 AM   Specimen: BLOOD RIGHT HAND  Result Value Ref Range Status   Specimen Description BLOOD RIGHT HAND  Final   Special Requests   Final    BOTTLES DRAWN AEROBIC ONLY Blood Culture results may not be optimal due to an inadequate volume of blood received in culture bottles   Culture  Setup Time   Final    GRAM POSITIVE COCCI IN CLUSTERS AEROBIC BOTTLE ONLY CRITICAL RESULT CALLED TO, READ BACK BY AND VERIFIED WITH: PHARMD TM TUCKER [id number] AT 717 BY CM Performed at [facility], [place] [address], [place], [place] [address]    Culture STAPHYLOCOCCUS AUREUS (A)  Final   Report Status PENDING  Incomplete  Culture, blood (routine x 2)  Status:  Abnormal (Preliminary result)   Collection Time: 12/26/20 10:19 AM   Specimen: BLOOD RIGHT WRIST  Result Value Ref Range Status   Specimen Description BLOOD RIGHT WRIST  Final   Special Requests   Final    BOTTLES DRAWN AEROBIC ONLY Blood Culture results may not be optimal due to an inadequate volume of blood received in culture bottles   Culture  Setup Time   Final    GRAM POSITIVE COCCI IN CLUSTERS AEROBIC BOTTLE ONLY CRITICAL RESULT CALLED TO, READ BACK BY AND VERIFIED WITH: PHARMD M TUCKER [id number] AT 7 BY CM    Culture (A)  Final    STAPHYLOCOCCUS AUREUS SUSCEPTIBILITIES TO FOLLOW Performed at [facility], [place] [address] [place], [place] [address]    Report Status PENDING  Incomplete  Culture, blood (routine x 2)     Status: None (Preliminary result)   Collection Time: 12/27/20  2:17 PM   Specimen: BLOOD  Result Value Ref Range Status   Specimen Description BLOOD THUMB  Final   Special Requests   Final    BOTTLES DRAWN AEROBIC AND ANAEROBIC Blood Culture adequate volume   Culture   Final    NO GROWTH < 24 HOURS Performed at [facility], [place] [address], [place], [place] [address]    Report Status PENDING  Incomplete  Culture, blood (routine x 2)     Status: None (Preliminary result)   Collection Time: 12/27/20  2:17 PM   Specimen: BLOOD RIGHT HAND  Result Value Ref Range Status   Specimen Description BLOOD RIGHT HAND  Final   Special Requests   Final    BOTTLES DRAWN AEROBIC AND ANAEROBIC Blood Culture adequate volume   Culture   Final    NO GROWTH < 12 HOURS Performed at [facility], [place] [address] [place], [place] [address]    Report Status PENDING  Incomplete    RN Pressure Injury Documentation:     Estimated body mass index is 21.26 kg/m as calculated from the following:   Height as of this encounter: 6' (1.829 m).   Weight as of this encounter: 71.1 kg.  Malnutrition Type:   Malnutrition Characteristics:   Nutrition  Interventions:   Radiology Studies: No results found. Scheduled Meds: . (feeding supplement) PROSource Plus  30 mL Oral BID BM  . Chlorhexidine Gluconate Cloth  6 each Topical Q0600  . cinacalcet  60 mg Oral Q supper  . diltiazem  120 mg Oral Daily  . docusate sodium  100 mg Oral BID  . feeding supplement (NEPRO CARB STEADY)  237 mL Oral TID BM  . methocarbamol  500 mg Oral TID  . multivitamin with minerals  1 tablet Oral Daily  . oxyCODONE  15 mg Oral Q12H  . pantoprazole  40 mg Oral Daily  . pregabalin  75 mg Oral QHS  . senna-docusate  1 tablet Oral BID  . sodium chloride flush  3 mL Intravenous Q12H   Continuous Infusions: . gentamicin 80 mg (12/27/20 1751)  . vancomycin Stopped (12/27/20 1214)    LOS: 6 days   Kerney Elbe, DO [facility] PAGER is on [place]  If 7PM-7AM, please contact night-coverage [email or web address]

## 2020-12-29 DIAGNOSIS — B9561 Methicillin susceptible Staphylococcus aureus infection as the cause of diseases classified elsewhere: Secondary | ICD-10-CM | POA: Diagnosis not present

## 2020-12-29 DIAGNOSIS — N186 End stage renal disease: Secondary | ICD-10-CM | POA: Diagnosis not present

## 2020-12-29 DIAGNOSIS — R7881 Bacteremia: Secondary | ICD-10-CM | POA: Diagnosis not present

## 2020-12-29 LAB — CBC WITH DIFFERENTIAL/PLATELET
Abs Immature Granulocytes: 0.2 10*3/uL — ABNORMAL HIGH (ref 0.00–0.07)
Basophils Absolute: 0 10*3/uL (ref 0.0–0.1)
Basophils Relative: 0 %
Eosinophils Absolute: 0.2 10*3/uL (ref 0.0–0.5)
Eosinophils Relative: 1 %
HCT: 31 % — ABNORMAL LOW (ref 39.0–52.0)
Hemoglobin: 9.5 g/dL — ABNORMAL LOW (ref 13.0–17.0)
Lymphocytes Relative: 4 %
Lymphs Abs: 0.8 10*3/uL (ref 0.7–4.0)
MCH: 24.3 pg — ABNORMAL LOW (ref 26.0–34.0)
MCHC: 30.6 g/dL (ref 30.0–36.0)
MCV: 79.3 fL — ABNORMAL LOW (ref 80.0–100.0)
Monocytes Absolute: 2.3 10*3/uL — ABNORMAL HIGH (ref 0.1–1.0)
Monocytes Relative: 11 %
Myelocytes: 1 %
Neutro Abs: 17.1 10*3/uL — ABNORMAL HIGH (ref 1.7–7.7)
Neutrophils Relative %: 83 %
Platelets: 317 10*3/uL (ref 150–400)
RBC: 3.91 MIL/uL — ABNORMAL LOW (ref 4.22–5.81)
RDW: 21.4 % — ABNORMAL HIGH (ref 11.5–15.5)
WBC: 20.6 10*3/uL — ABNORMAL HIGH (ref 4.0–10.5)
nRBC: 0 % (ref 0.0–0.2)
nRBC: 0 /100 WBC

## 2020-12-29 LAB — COMPREHENSIVE METABOLIC PANEL
ALT: 11 U/L (ref 0–44)
AST: 28 U/L (ref 15–41)
Albumin: 2.2 g/dL — ABNORMAL LOW (ref 3.5–5.0)
Alkaline Phosphatase: 74 U/L (ref 38–126)
Anion gap: 13 (ref 5–15)
BUN: 35 mg/dL — ABNORMAL HIGH (ref 6–20)
CO2: 26 mmol/L (ref 22–32)
Calcium: 9.5 mg/dL (ref 8.9–10.3)
Chloride: 94 mmol/L — ABNORMAL LOW (ref 98–111)
Creatinine, Ser: 6.51 mg/dL — ABNORMAL HIGH (ref 0.61–1.24)
GFR, Estimated: 10 mL/min — ABNORMAL LOW (ref >60)
Glucose, Bld: 93 mg/dL (ref 70–99)
Potassium: 4.1 mmol/L (ref 3.5–5.1)
Sodium: 133 mmol/L — ABNORMAL LOW (ref 135–145)
Total Bilirubin: 1.9 mg/dL — ABNORMAL HIGH (ref 0.3–1.2)
Total Protein: 8.2 g/dL — ABNORMAL HIGH (ref 6.5–8.1)

## 2020-12-29 LAB — RENAL FUNCTION PANEL
Albumin: 2.1 g/dL — ABNORMAL LOW (ref 3.5–5.0)
Anion gap: 14 (ref 5–15)
BUN: 34 mg/dL — ABNORMAL HIGH (ref 6–20)
CO2: 25 mmol/L (ref 22–32)
Calcium: 9.6 mg/dL (ref 8.9–10.3)
Chloride: 94 mmol/L — ABNORMAL LOW (ref 98–111)
Creatinine, Ser: 6.4 mg/dL — ABNORMAL HIGH (ref 0.61–1.24)
GFR, Estimated: 10 mL/min — ABNORMAL LOW (ref >60)
Glucose, Bld: 92 mg/dL (ref 70–99)
Phosphorus: 3.7 mg/dL (ref 2.5–4.6)
Potassium: 4 mmol/L (ref 3.5–5.1)
Sodium: 133 mmol/L — ABNORMAL LOW (ref 135–145)

## 2020-12-29 LAB — GENTAMICIN LEVEL, RANDOM: Gentamicin Rm: 2.7 ug/mL

## 2020-12-29 LAB — CULTURE, BLOOD (ROUTINE X 2)
Culture: NO GROWTH
Culture: NO GROWTH
Special Requests: ADEQUATE
Special Requests: ADEQUATE

## 2020-12-29 LAB — PHOSPHORUS: Phosphorus: 3.7 mg/dL (ref 2.5–4.6)

## 2020-12-29 LAB — MAGNESIUM: Magnesium: 2.2 mg/dL (ref 1.7–2.4)

## 2020-12-29 LAB — VANCOMYCIN, RANDOM: Vancomycin Rm: 24

## 2020-12-29 MED ORDER — VANCOMYCIN HCL 750 MG/150ML IV SOLN
INTRAVENOUS | Status: AC
  Administered 2020-12-29: 750 mg
  Filled 2020-12-29: qty 150

## 2020-12-29 MED ORDER — GENTAMICIN IN SALINE 1-0.9 MG/ML-% IV SOLN
100.0000 mg | INTRAVENOUS | Status: DC
  Administered 2020-12-29 – 2020-12-31 (×2): 100 mg via INTRAVENOUS
  Filled 2020-12-29 (×3): qty 100

## 2020-12-29 NOTE — Plan of Care (Signed)

## 2020-12-29 NOTE — Progress Notes (Signed)
[facility] Infectious Diseases Follow Up Note  Patient Identification: Patient Name: Patrick Anthony MRN: [id number] [place] Date: 12/22/2020  6:57 AM Age: 50 y.o.Today's Date: 12/29/2020   Reason for Visit: PV endocarditis and fevers   Principal Problem:   Bacteremia due to Staphylococcus aureus Active Problems:   Anemia in chronic kidney disease   Hepatitis C   ESRD on dialysis ([place])   S/P mitral valve replacement   S/P AKA (above knee amputation), right (HCC)   Personal history of Methicillin resistant Staphylococcus aureus infection   SIRS (systemic inflammatory response syndrome) (HCC)   Antibiotics: Vancomycin 5/18-current                    Gentamicin 5/20-current   Lines/Tubes: Left upper arm AV graft, PIV's  Interval Events: Intermittent fevers, WBC went up from 18-20, repeat blood culture 523 1 out of 4 bottles positive for staph aureus   Assessment MRSA bacteremia  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 However, repeat blood cx on 5/22 and 5/23 positive for MRSA  Not a surgical candidate per TCTS  T10/T11 discitis and osteomyelitis ESRD on hemodialysis via left arm AV graft-denies pain tenderness swelling at AV graft site, no issues with HD  Hepatitis C- tx as OP   Recommendations Continue vancomycin and gentamicin pharmacy to dose Repeat blood culture 5/23 1 out of 4 bottles positive for staph aureus.  2 sets of Blood cultures today. Seems he is clearing his blood cultures given less no of positive blood culture bottles and time to positivity  We will also request susceptibility of staph aureus for daptomycin, linezolid and ceftaroline in case need to switch in the future Hold on adding Rifampin  Monitor CBC and CMP on IV antibiotics  Following   Rest of the management as per the  primary team. Thank you for the consult. Please page with pertinent questions or concerns.  ______________________________________________________________________ Subjective patient seen and examined at the bedside. Undergoing HD. Fevers, denies chills and sweats. Back pain is stable and gets worse only on moving. Denies any pain/tenderness and swelling at the left arm AV graft site. Denies nausea, vomiting and diarrhea. Denies any cough, chest pain and SOB.   Vitals BP (!) 146/112 (BP Location: Right Wrist)   Pulse (!) 119   Temp 98.9 F (37.2 C) (Oral)   Resp (!) 22   Ht 6' (1.829 m)   Wt 71.5 kg   SpO2 100%   BMI 21.38 kg/m     Physical Exam Constitutional:  Not in acute distress, comfortable and having HD    Comments:   Cardiovascular:     Rate and Rhythm: Normal rate and regular rhythm.     Heart sounds: Systolic murmur + Pulmonary:     Effort: Pulmonary effort is normal.     Comments: On nasal cannula   Abdominal:     Palpations: Abdomen is soft.     Tenderness: Non distended and non tender   Musculoskeletal:        General: No swelling or tenderness. No peripheral joint swelling and tenderness   Skin:    Comments: No lesions or rashes   Neurological:     General: No focal deficit present.   Psychiatric:        Mood and Affect: Mood normal.   Pertinent Microbiology Results for orders placed or performed during the hospital encounter of  12/22/20  Resp Panel by RT-PCR (Flu A&B, Covid) Nasopharyngeal Swab     Status: None   Collection Time: 12/22/20  7:14 AM   Specimen: Nasopharyngeal Swab; Nasopharyngeal(NP) swabs in vial transport medium  Result Value Ref Range Status   SARS Coronavirus 2 by RT PCR NEGATIVE NEGATIVE Final    Comment: (NOTE) SARS-CoV-2 target nucleic acids are NOT DETECTED.  The SARS-CoV-2 RNA is generally detectable in upper respiratory specimens during the acute phase of infection. The lowest concentration of SARS-CoV-2 viral copies  this assay can detect is 138 copies/mL. A negative result does not preclude SARS-Cov-2 infection and should not be used as the sole basis for treatment or other patient management decisions. A negative result may occur with  improper specimen collection/handling, submission of specimen other than nasopharyngeal swab, presence of viral mutation(s) within the areas targeted by this assay, and inadequate number of viral copies(<138 copies/mL). A negative result must be combined with clinical observations, patient history, and epidemiological information. The expected result is Negative.  Fact Sheet for Patients:  EntrepreneurPulse.com.au  Fact Sheet for Healthcare Providers:  IncredibleEmployment.be  This test is no t yet approved or cleared by the [place] FDA and  has been authorized for detection and/or diagnosis of SARS-CoV-2 by FDA under an Emergency Use Authorization (EUA). This EUA will remain  in effect (meaning this test can be used) for the duration of the COVID-19 declaration under Section 564(b)(1) of the Act, 21 U.S.C.section 360bbb-3(b)(1), unless the authorization is terminated  or revoked sooner.       Influenza A by PCR NEGATIVE NEGATIVE Final   Influenza B by PCR NEGATIVE NEGATIVE Final    Comment: (NOTE) The Xpert Xpress SARS-CoV-2/FLU/RSV plus assay is intended as an aid in the diagnosis of influenza from Nasopharyngeal swab specimens and should not be used as a sole basis for treatment. Nasal washings and aspirates are unacceptable for Xpert Xpress SARS-CoV-2/FLU/RSV testing.  Fact Sheet for Patients: EntrepreneurPulse.com.au  Fact Sheet for Healthcare Providers: IncredibleEmployment.be  This test is not yet approved or cleared by the [place] FDA and has been authorized for detection and/or diagnosis of SARS-CoV-2 by FDA under an Emergency Use Authorization (EUA). This EUA  will remain in effect (meaning this test can be used) for the duration of the COVID-19 declaration under Section 564(b)(1) of the Act, 21 U.S.C. section 360bbb-3(b)(1), unless the authorization is terminated or revoked.  Performed at [facility], [place] [address] [place], [place] [address]   Blood Culture (routine x 2)     Status: Abnormal   Collection Time: 12/22/20  7:14 AM   Specimen: BLOOD  Result Value Ref Range Status   Specimen Description BLOOD SITE NOT SPECIFIED  Final   Special Requests   Final    BOTTLES DRAWN AEROBIC AND ANAEROBIC Blood Culture adequate volume   Culture  Setup Time   Final    GRAM POSITIVE COCCI IN CLUSTERS IN BOTH AEROBIC AND ANAEROBIC BOTTLES CRITICAL RESULT CALLED TO, READ BACK BY AND VERIFIED WITHDion Body [facility] 2028 12/22/20 A BROWNING Performed at [facility], [place] [address], [place], [name] [address]    Culture METHICILLIN RESISTANT STAPHYLOCOCCUS AUREUS (A)  Final   Report Status 12/24/2020 FINAL  Final   Organism ID, Bacteria METHICILLIN RESISTANT STAPHYLOCOCCUS AUREUS  Final      Susceptibility   Methicillin resistant staphylococcus aureus - MIC*    CIPROFLOXACIN 4 RESISTANT Resistant     ERYTHROMYCIN >=8 RESISTANT Resistant  GENTAMICIN <=0.5 SENSITIVE Sensitive     OXACILLIN >=4 RESISTANT Resistant     TETRACYCLINE <=1 SENSITIVE Sensitive     VANCOMYCIN 1 SENSITIVE Sensitive     TRIMETH/SULFA <=10 SENSITIVE Sensitive     CLINDAMYCIN <=0.25 SENSITIVE Sensitive     RIFAMPIN <=0.5 SENSITIVE Sensitive     Inducible Clindamycin NEGATIVE Sensitive     * METHICILLIN RESISTANT STAPHYLOCOCCUS AUREUS  Blood Culture ID Panel (Reflexed)     Status: Abnormal   Collection Time: 12/22/20  7:14 AM  Result Value Ref Range Status   Enterococcus faecalis NOT DETECTED NOT DETECTED Final   Enterococcus Faecium NOT DETECTED NOT DETECTED Final   Listeria monocytogenes NOT DETECTED NOT DETECTED Final   Staphylococcus species DETECTED (A)  NOT DETECTED Final    Comment: CRITICAL RESULT CALLED TO, READ BACK BY AND VERIFIED WITH: J MILLEN PHARMD 2028 12/22/20 A BROWNING    Staphylococcus aureus (BCID) DETECTED (A) NOT DETECTED Final    Comment: Methicillin (oxacillin)-resistant Staphylococcus aureus (MRSA). MRSA is predictably resistant to beta-lactam antibiotics (except ceftaroline). Preferred therapy is vancomycin unless clinically contraindicated. Patient requires contact precautions if  hospitalized. CRITICAL RESULT CALLED TO, READ BACK BY AND VERIFIED WITH: Dion Body PHARMD 2028 12/22/20 A BROWNING    Staphylococcus epidermidis NOT DETECTED NOT DETECTED Final   Staphylococcus lugdunensis NOT DETECTED NOT DETECTED Final   Streptococcus species NOT DETECTED NOT DETECTED Final   Streptococcus agalactiae NOT DETECTED NOT DETECTED Final   Streptococcus pneumoniae NOT DETECTED NOT DETECTED Final   Streptococcus pyogenes NOT DETECTED NOT DETECTED Final   A.calcoaceticus-baumannii NOT DETECTED NOT DETECTED Final   Bacteroides fragilis NOT DETECTED NOT DETECTED Final   Enterobacterales NOT DETECTED NOT DETECTED Final   Enterobacter cloacae complex NOT DETECTED NOT DETECTED Final   Escherichia coli NOT DETECTED NOT DETECTED Final   Klebsiella aerogenes NOT DETECTED NOT DETECTED Final   Klebsiella oxytoca NOT DETECTED NOT DETECTED Final   Klebsiella pneumoniae NOT DETECTED NOT DETECTED Final   Proteus species NOT DETECTED NOT DETECTED Final   Salmonella species NOT DETECTED NOT DETECTED Final   Serratia marcescens NOT DETECTED NOT DETECTED Final   Haemophilus influenzae NOT DETECTED NOT DETECTED Final   Neisseria meningitidis NOT DETECTED NOT DETECTED Final   Pseudomonas aeruginosa NOT DETECTED NOT DETECTED Final   Stenotrophomonas maltophilia NOT DETECTED NOT DETECTED Final   Candida albicans NOT DETECTED NOT DETECTED Final   Candida auris NOT DETECTED NOT DETECTED Final   Candida glabrata NOT DETECTED NOT DETECTED Final    Candida krusei NOT DETECTED NOT DETECTED Final   Candida parapsilosis NOT DETECTED NOT DETECTED Final   Candida tropicalis NOT DETECTED NOT DETECTED Final   Cryptococcus neoformans/gattii NOT DETECTED NOT DETECTED Final   Meth resistant mecA/C and MREJ DETECTED (A) NOT DETECTED Final    Comment: CRITICAL RESULT CALLED TO, READ BACK BY AND VERIFIED WITHDion Body [facility] 2028 12/22/20 A BROWNING Performed at [facility], [address] [place],  [address]   Blood Culture (routine x 2)     Status: Abnormal   Collection Time: 12/22/20  7:19 AM   Specimen: BLOOD  Result Value Ref Range Status   Specimen Description BLOOD SITE NOT SPECIFIED  Final   Special Requests   Final    BOTTLES DRAWN AEROBIC AND ANAEROBIC Blood Culture results may not be optimal due to an excessive volume of blood received in culture bottles   Culture  Setup Time   Final    GRAM POSITIVE  COCCI IN CLUSTERS IN BOTH AEROBIC AND ANAEROBIC BOTTLES IDENTIFICATION TO FOLLOW CRITICAL RESULT CALLED TO, READ BACK BY AND VERIFIED WITH: Dion Body PHARMD 2028 12/22/20 A BROWNING    Culture (A)  Final    STAPHYLOCOCCUS AUREUS SUSCEPTIBILITIES PERFORMED ON PREVIOUS CULTURE WITHIN THE LAST 5 DAYS. Performed at [facility], [place] [address], [place], [place] [address]    Report Status 12/24/2020 FINAL  Final  Culture, blood (routine x 2)     Status: None   Collection Time: 12/24/20  3:10 PM   Specimen: BLOOD RIGHT HAND  Result Value Ref Range Status   Specimen Description BLOOD RIGHT HAND  Final   Special Requests   Final    BOTTLES DRAWN AEROBIC ONLY Blood Culture adequate volume   Culture   Final    NO GROWTH 5 DAYS Performed at [facility], [place] [address] [place], [place] [address]    Report Status 12/29/2020 FINAL  Final  Culture, blood (routine x 2)     Status: None   Collection Time: 12/24/20  3:37 PM   Specimen: BLOOD  Result Value Ref Range Status   Specimen Description BLOOD BLOOD RIGHT  FOREARM  Final   Special Requests   Final    BOTTLES DRAWN AEROBIC ONLY Blood Culture adequate volume   Culture   Final    NO GROWTH 5 DAYS Performed at [facility], [place] [address] [place], [place] [address]    Report Status 12/29/2020 FINAL  Final  MRSA PCR Screening     Status: None   Collection Time: 12/25/20 10:18 PM   Specimen: Nasal Mucosa; Nasopharyngeal  Result Value Ref Range Status   MRSA by PCR NEGATIVE NEGATIVE Final    Comment:        The GeneXpert MRSA Assay (FDA approved for NASAL specimens only), is one component of a comprehensive MRSA colonization surveillance program. It is not intended to diagnose MRSA infection nor to guide or monitor treatment for MRSA infections. Performed at [facility], [place] [address], [place], [place] [address]   Culture, blood (routine x 2)     Status: Abnormal   Collection Time: 12/26/20 10:19 AM   Specimen: BLOOD RIGHT HAND  Result Value Ref Range Status   Specimen Description BLOOD RIGHT HAND  Final   Special Requests   Final    BOTTLES DRAWN AEROBIC ONLY Blood Culture results may not be optimal due to an inadequate volume of blood received in culture bottles   Culture  Setup Time   Final    GRAM POSITIVE COCCI IN CLUSTERS AEROBIC BOTTLE ONLY CRITICAL RESULT CALLED TO, READ BACK BY AND VERIFIED WITH: PHARMD TM TUCKER [id number] AT 3 BY CM    Culture (A)  Final    STAPHYLOCOCCUS AUREUS SUSCEPTIBILITIES PERFORMED ON PREVIOUS CULTURE WITHIN THE LAST 5 DAYS. Performed at [facility], [place] [address], [place], [place] [address]    Report Status 12/29/2020 FINAL  Final  Culture, blood (routine x 2)     Status: Abnormal   Collection Time: 12/26/20 10:19 AM   Specimen: BLOOD RIGHT WRIST  Result Value Ref Range Status   Specimen Description BLOOD RIGHT WRIST  Final   Special Requests   Final    BOTTLES DRAWN AEROBIC ONLY Blood Culture results may not be optimal due to an inadequate volume of blood received in  culture bottles   Culture  Setup Time   Final    GRAM  POSITIVE COCCI IN CLUSTERS AEROBIC BOTTLE ONLY CRITICAL RESULT CALLED TO, READ BACK BY AND VERIFIED WITH: Trude Mcburney [id number] AT 42 BY CM Performed at [facility], [place] [address] [place], [place] [address]    Culture METHICILLIN RESISTANT STAPHYLOCOCCUS AUREUS (A)  Final   Report Status 12/29/2020 FINAL  Final   Organism ID, Bacteria METHICILLIN RESISTANT STAPHYLOCOCCUS AUREUS  Final      Susceptibility   Methicillin resistant staphylococcus aureus - MIC*    CIPROFLOXACIN >=8 RESISTANT Resistant     ERYTHROMYCIN >=8 RESISTANT Resistant     GENTAMICIN <=0.5 SENSITIVE Sensitive     OXACILLIN >=4 RESISTANT Resistant     TETRACYCLINE <=1 SENSITIVE Sensitive     VANCOMYCIN 1 SENSITIVE Sensitive     TRIMETH/SULFA <=10 SENSITIVE Sensitive     CLINDAMYCIN <=0.25 SENSITIVE Sensitive     RIFAMPIN <=0.5 SENSITIVE Sensitive     Inducible Clindamycin NEGATIVE Sensitive     * METHICILLIN RESISTANT STAPHYLOCOCCUS AUREUS  Culture, blood (routine x 2)     Status: None (Preliminary result)   Collection Time: 12/27/20  2:17 PM   Specimen: BLOOD  Result Value Ref Range Status   Specimen Description BLOOD THUMB  Final   Special Requests   Final    BOTTLES DRAWN AEROBIC AND ANAEROBIC Blood Culture adequate volume   Culture   Final    NO GROWTH 2 DAYS Performed at [facility], [address] [place], [place] [address]    Report Status PENDING  Incomplete  Culture, blood (routine x 2)     Status: Abnormal (Preliminary result)   Collection Time: 12/27/20  2:17 PM   Specimen: BLOOD RIGHT HAND  Result Value Ref Range Status   Specimen Description BLOOD RIGHT HAND  Final   Special Requests   Final    BOTTLES DRAWN AEROBIC AND ANAEROBIC Blood Culture adequate volume   Culture  Setup Time   Final    GRAM POSITIVE COCCI IN CLUSTERS ANAEROBIC BOTTLE ONLY CRITICAL VALUE NOTED.  VALUE IS CONSISTENT WITH PREVIOUSLY REPORTED AND  CALLED VALUE.    Culture (A)  Final    STAPHYLOCOCCUS AUREUS CULTURE REINCUBATED FOR BETTER GROWTH Performed at [facility], [place] [address], [place], [place] [address]    Report Status PENDING  Incomplete     Pertinent Lab. CBC Latest Ref Rng & Units 12/29/2020 12/28/2020 12/27/2020  WBC 4.0 - 10.5 K/uL 20.6(H) 18.8(H) 20.4(H)  Hemoglobin 13.0 - 17.0 g/dL 9.5(L) 9.6(L) 9.2(L)  Hematocrit 39.0 - 52.0 % 31.0(L) 32.4(L) 30.7(L)  Platelets 150 - 400 K/uL 317 337 330   CMP Latest Ref Rng & Units 12/29/2020 12/29/2020 12/28/2020  Glucose 70 - 99 mg/dL 93 92 95  BUN 6 - 20 mg/dL 35(H) 34(H) 25(H)  Creatinine 0.61 - 1.24 mg/dL 6.51(H) 6.40(H) 4.94(H)  Sodium 135 - 145 mmol/L 133(L) 133(L) 134(L)  Potassium 3.5 - 5.1 mmol/L 4.1 4.0 3.6  Chloride 98 - 111 mmol/L 94(L) 94(L) 94(L)  CO2 22 - 32 mmol/L '26 25 27  '$ Calcium 8.9 - 10.3 mg/dL 9.5 9.6 9.1  Total Protein 6.5 - 8.1 g/dL 8.2(H) - 8.3(H)  Total Bilirubin 0.3 - 1.2 mg/dL 1.9(H) - 0.8  Alkaline Phos 38 - 126 U/L 74 - 87  AST 15 - 41 U/L 28 - 25  ALT 0 - 44 U/L 11 - 10    Pertinent Imaging today Plain films and CT images have been personally visualized and interpreted; radiology reports have been  reviewed. Decision making incorporated into the Impression / Recommendations.  I have spent more than 35 minutes for this patient encounter including review of prior medical records, coordination of care  with greater than 50% of time being face to face/counseling and discussing diagnostics/treatment plan with the patient/family.  Electronically signed by:   Rosiland Oz, MD Infectious Disease Physician [facility] for Infectious Disease Pager: [phone]

## 2020-12-29 NOTE — Progress Notes (Signed)
[place] MEWS @ [id number]. Will continue to monitor.

## 2020-12-29 NOTE — TOC Initial Note (Addendum)
Transition of Care [facility]) - Initial/Assessment Note    Patient Details  Name: Patrick Anthony MRN: [id number] Date of Birth: 11/30/1970  [facility]) CM/SW Contact:    Carles Collet, RN Phone Number: 12/29/2020, 3:15 PM  Clinical Narrative:              Patrick Anthony w patient at bedside. He states that he lived at home alone in a second floor apartment prior to past hospitalization. He does not have any family in the area that can help him. After BKA he was discharged to [facility] for rehab. He was readmitted for bacteremia, and states that he will need to complete his days available at [facility] prior to returning home.  He would like to return to [facility].  Regarding his home- we discussed his accessibility to his apartment, he states that he has been working with his landlord to get a first floor apartment, and [place] had been assisting him with this as well. As of today he does not have a resolution to this problem. He states that after [place] he is considering going to [facility], as all of his family is there. He states that he was just talking to his son about this last night on the phone. His family is encouraging him to relocate so they can support him. He has granted [place] permission to speak with any of his children as needed.     Patient with MRSA endocarditis, discitis, osteomylitis, persistent bacteremia w repeat cultures 5/23 still positive. Currently on vanc and gent that could be dosed in the outpatient HD setting after DC as needed, however ID recs may change prior to DC.  MRI w multiple small CVAs from emboli  Prior to admission patient assigned to HD MWF at [facility].   Expected Discharge Plan: Skilled Nursing Facility Barriers to Discharge: Continued Medical Work up   Patient Goals and CMS Choice        Expected Discharge Plan and Services Expected Discharge Plan: [place] arrangements  for the past 2 months: [place]                                      Prior Living Arrangements/Services Living arrangements for the past 2 months: [place] Lives with:: Self                   Activities of Daily Living Home Assistive Devices/Equipment: None (to have walker in rehab center) ADL Screening (condition at time of admission) Patient's cognitive ability adequate to safely complete daily activities?: Yes Is the patient deaf or have difficulty hearing?: No Does the patient have difficulty seeing, even when wearing glasses/contacts?: Yes Does the patient have difficulty concentrating, remembering, or making decisions?: No Patient able to express need for assistance with ADLs?: Yes Does the patient have difficulty dressing or bathing?: Yes Independently performs ADLs?: No Communication: Needs assistance Dressing (OT): Independent Grooming: Independent Feeding: Independent Bathing: Needs assistance Toileting: Independent Is this a change from baseline?: Pre-admission baseline In/Out Bed: Needs assistance Is this a change from baseline?: Pre-admission baseline Walks in Home: Independent with device (comment) Is this a change from baseline?: Pre-admission baseline Does the patient have difficulty walking or climbing stairs?: Yes Weakness of Legs:  (R BKA) Weakness of Arms/Hands: None  Permission Sought/Granted Permission sought  to share information with : Family Supports Permission granted to share information with : Yes, Verbal Permission Granted  Share Information with NAME: children           Emotional Assessment              Admission diagnosis:  SIRS (systemic inflammatory response syndrome) ([place]) [R65.10] Severe sepsis ([place]) [A41.9, R65.20] Patient Active Problem List   Diagnosis Date Noted  . SIRS (systemic inflammatory response syndrome) ([place]) 12/22/2020  . Moderate protein-calorie  malnutrition ([place]) 11/11/2020  . Other acute osteomyelitis, right tibia and fibula ([place]) 11/11/2020  . S/P AKA (above knee amputation), right ([place])   . 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 Chronic osteomyelitis of right tibia with draining sinus ([place])   . Abscess of right leg   . S/P mitral valve replacement   . S/P mitral valve repair   . S/P minimally-invasive mitral valve replacement with bioprosthetic valve 10/14/2020  . Dental caries   . Chronic apical periodontitis   . Chronic periodontitis   . Accretions on teeth   . Mitral regurgitation   . Uncontrolled hypertension   . Patent foramen ovale   . Cerebral embolism with cerebral infarction 09/29/2020  . 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 Bacteremia due to Staphylococcus aureus   . 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 Lactic acidosis 09/27/2020  . Bacterial endocarditis 09/27/2020  . Allergy, unspecified, subsequent encounter 04/15/2020  . Anaphylactic shock, unspecified, subsequent encounter 04/15/2020  . Complication of vascular dialysis catheter 10/31/2019  . Kidney disease 02/04/2019  . Personal history of Methicillin resistant Staphylococcus aureus infection 09/11/2018  . Encounter for immunization 09/07/2017  . Dependence on renal dialysis ([place]) 03/07/2017  . Iron deficiency anemia, unspecified 11/15/2015  . Coagulation defect, unspecified ([place]) 10/08/2015  . Gastrointestinal hemorrhage, unspecified 10/08/2015  . Pain, unspecified 10/08/2015  . Secondary hyperparathyroidism of renal origin ([place]) 10/08/2015  . End stage renal disease ([place]) 10/17/2013  . Anemia in chronic kidney disease 08/30/2013  . Hepatitis C 08/30/2013  . ESRD on dialysis ([place]) 08/30/2013  . Hypertensive crisis 08/28/2013  . Hypertensive emergency 04/20/2013  . Chronic combined systolic and diastolic congestive  heart failure ([place]) 04/19/2013  . HTN (hypertension) 04/19/2013   PCP:  [name], MD Pharmacy:   [facility], [place] [address] [place] [address] Phone: [phone] Fax: [phone]     Social Determinants of Health (SDOH) Interventions    Readmission Risk Interventions Readmission Risk Prevention Plan 11/05/2020  Transportation Screening Complete  Medication Review (RN Care Manager) Complete  PCP or Specialist appointment within 3-5 days of discharge Complete  HRI or Home Care Consult Complete  SW Recovery Care/Counseling Consult Complete  Palliative Care Screening Complete  Skilled Nursing Facility Complete  Some recent data might be hidden

## 2020-12-29 NOTE — Progress Notes (Signed)
PROGRESS NOTE    Patrick Anthony  [id number] DOB: October 22, 1970 DOA: 12/22/2020 PCP: [name], MD   Brief Narrative:  The patient is a 89 atrial African-American male with a past medical history significant for but not limited to end-stage renal disease on hemodialysis on Monday Wednesday Friday, chronic diastolic CHF, hyperlipidemia, history of liver cirrhosis secondary to hepatitis C, history of alcohol abuse, OSA as well as recent AKA done by Dr. Sharol Given on 10/29/2020 and other comorbidities who presented with a chief complaint of spiking fevers for last few days and then altered mental status.  Reportedly patient was at his facility when they were noticing that he was altered and history is difficult to be obtained from the patient himself.  Of note he has had a prolonged hospitalization from 09/27/20 until November 11, 2019 he was found to have MRSA bacteremia as well as mitral valve endocarditis with PFO and septic emboli to the brain along with that time.  He underwent a bioprosthetic mitral valve replacement along with closure of the PFO on 10/14/2020.  Also during that hospitalization he was treated for osteomyelitis and abscess of the right tibia status post AKA on 10/29/2020 by Dr. Sharol Given.  He was discharged to [facility] for continuation of vancomycin at the nursing facility until 11/25/2020.  Reportedly he is usually alert and oriented x4.  Yesterday he is only alert and oriented to person and place but not the situation.  He complained about severe pain in his back has been present for some time and he describes shooting pains in the right leg but was amputated.  He denies any shortness breath or cough.  In the ED he presented with a fever of 103 and rales were reported in the lower lung field on the right lung.  Upon arrival to the ED he was noted to be septic given that he had an elevated temperature, respirations as well as being tachycardia and had a new O2 requirement as well as being altered.   CT scan of the brain showed no acute abnormalities but did show several changes that were chronic injury to the areas of infarct noted on the previous MRI.  His WBC was elevated 20,200.  Chest x-ray showed scarring in the right upper lobe, middle lobe atelectasis and a small left-sided pleural effusion stable cardiomegaly.  Initial sepsis protocol was initiated and he was given IV vancomycin and cefepime.  Further work-up reveals that he has another MRSA bacteremia and infectious diseases is involved again and nephrology is involved as well.  Patient is to get a repeat TEE and this was done today 12/24/20 to evaluate his mitral valve. Still pending to get a MRI the T-spine with and without contrast with hemodialysis after.  UnFortunately a TEE shows likely bioprosthetic valve infection so infectious disease is now going to add gentamicin and rifampin once his cultures are cleared.  We will still pursue his thoracic spine MRI which is still pending to be done.  Overnight the patient developed blurred vision and he underwent a head MRI which showed recurrent cardioembolic strokes in the setting of septic emboli from infective endocarditis.  Neurology was consulted and they did not recommend further work-up given that he recently just had an MRI of the brain with and without contrast on 09/28/2020 as well as an echo and carotid Dopplers.  Neurology recommending holding antiplatelets given the risk of hemorrhagic conversion of recurrent infective endocarditis related strokes and resume in 1 week if patient remains stable.  Patient's aspirin was then discontinued.  MRI of the spine shows early discitis-osteomyelitis at the T10-T11 level with mild perivertebral soft tissue edema at this level and no evidence of epidural fluid collection or abscess.  There is also moderate endplate marrow edema centered at C6-7 intervertebral disc space similar to previous MRI and small bilateral pleural effusion.  Cardiothoracic  surgery evaluated and they feel that he is a candidate for redo mitral valve replacement and aortic valve replacement 3 months following previous surgery as they do not feel that he would survive this.  They recommend continuing antibiotic therapy.  On the morning of 12/26/20 he went into SVT and heart rates spiked in the 150s and he was given at 250 mL bolus, tried vagal maneuvers which did not break his rhythm and he was given Tylenol for his temperature of 102.8.  Cardiology was consulted and he was given adenosine 6 mg and then it looked like 2-1 flutter and he was placed on a diltiazem drip and bolus.  Cardiology feels that he cannot be anticoagulated this time given his risk for hemorrhagic conversion.  Further review of telemetry showed that his heart rate may just represent sinus tachycardia and as there is no clear flutter waves and now cardiology transition him to oral diltiazem.  He Continues to spike temperatures and next hemodialysis session per nephrology.  Palliative care consulted and current goals of care discussion is still aggressive and full care p.o. diltiazem to 120 mg daily  Assessment & Plan:   Principal Problem:   Bacteremia due to Staphylococcus aureus Active Problems:   Anemia in chronic kidney disease   Hepatitis C   ESRD on dialysis (HCC)   S/P mitral valve replacement   S/P AKA (above knee amputation), right (HCC)   Personal history of Methicillin resistant Staphylococcus aureus infection   SIRS (systemic inflammatory response syndrome) (HCC)  Severe sepsis secondary to an MRSA bacteremia and Prostetic Valve Endocarditis present on admission -Presented the fever up to 103 F, tachycardia, tachypnea, a new oxygen requirement, altered mental status as well as a leukocytosis of 20,200 and also with hypotension -Chest x-ray on admission only noted low lung volumes and possible signs of atelectasis with no clear source of infection appreciated at that time -Blood  cultures now revealing an MRSA bacteremia; Repeat Blood Cx showed no growth to date but will obtain another set today given that he became febrile and tachycardic -He has been admitted progressive bed -He was initiated on IV antibiotics with vancomycin and cefepime but cefepime has now been discontinued -Infectious diseases has been consulted and plan further work-up for the patient and have ordered a repeat TEE and discussed with cardiology given his prostatic mitral valve and also will be getting a T-spine MRI with and without contrast with dialysis to be done afterwards; MRI done and showed that he has early changes of discitis and osteomyelitis at the T10-T11 level -Patient did get TEE which showed "Possible small vegetations associated with the bioprosthetic mitral valve and native aortic valve. No LAA thrombus. Negative for PFO. Moderate LVH Severe RA/RV dilitation with mild TR LVEF 60-65%." -We will be continuing vancomycin but because of Concern for possible Valvular Vegatations including a small, moblile Filamentous Structure on the posterior bioprosthetic vavlve leaflet was concerning for vegetation, ID now recommending PVE Coverage and will start Gentamicin and Add Rifampin once CX's Clear -procalcitonin level was 28.50 -> 28.84 -> 23.98 -CRP was 17.2 -Repeat blood cultures have been planned for 12/24/2020 -Patient continues  to have persistent sepsis physiology as he continues to spike temperatures and becomes tachycardic and tachypneic in the setting of his bacteremia -Appreciate further ID evaluation recommendations; sepsis physiology was improving but patient continues to still spike Temperatures and becomes intermittently tachycardic and tachypenic  -Patient's WBC minimally improved and went from 20.2 -> 19.7 -> 21.1 -> 28.9 -> 23.8 -> 20.4 -> 18.8 -Now infectious diseases recommending referral to cardiothoracic surgery and continuing gentamicin and vancomycin and adding rifampin later once  blood cultures remain negative; cardiothoracic surgery feels that he is not a candidate for redoing his surgical intervention -Repeat Blood Cx's from 12/26/20 POSITIVE AGAIN but the ones drawn a few days ago showed no growth to date -Infectious diseases to repeat blood cultures on 12/27/2020 showing NGTD <24 Hours so far  -Given his high risk for further decompensation and poor prognosis we will consult palliative care for goals of care discussion and he wants to pursue aggressive Treament and FULL CODE Plan: --cont IV vanc and gentamicin  Acute CVAs recurrent cardioembolic strokes  in the setting of septic emboli from infective endocarditis -MRI of the brain overnight done and showed "Numerous punctate foci of abnormal diffusion restriction within both hemispheres in multiple vascular territories, consistent with acute infarcts.  Multiple scattered microhemorrhages in a nonspecific pattern." -Neurology was consulted and patient already had most of the stroke work-up done in the last few admissions and they did not recommend repeating echocardiogram as he had a TEE 520 confirming infective endocarditis -Neurology recommended holding antiplatelets at the time given risk of hemorrhagic conversion -DVT prophylaxis changed from heparin to SCDs and recommending resuming in 1 week -Neurology recommended continue telemetry monitoring and a 30-day event monitor on discharge if no arrhythmias were captured  Elevated troponin 2/2 demand ischemia -Troponin was elevated and went to 774 and likely in the setting of demand ischemia in the setting of his acute illness with bacteremia and tachyarrhythmias; he did have normal coronary arteries in 10/2020  Arrhythmia/new onset SVT -Patient was found to be in SVT this morning and tried vagal maneuvers without success He was given a beta-blocker early this morning as well with out any improvement.  He is also given 250 normal saline bolus -Cardiology was consulted  and troponins were obtained and his troponin level was 613 and trended up to 774 -Cardiology evaluated and gave the patient 6 mg of adenosine which slowed his rhythm and it looked like a 2-1 a flutter block so they started him on diltiazem drip -Once his arrhythmia improved and his heart rate slowed down cardiology reviewed his telemetry and they felt that it was not a flutter and may just represent sinus tachycardia so the will transition to oral diltiazem. -Cardiology following appreciate evaluation and Dr. Gardiner Rhyme evaluated today and patient is currently in sinus tachycardia with rates in the 100 and has been switched to p.o. diltiazem 30 mg 4 times daily and now further consolidated to diltiazem 140 mg p.o. daily -It is unclear if he did have atrial flutter but he is not a good candidate for anticoagulation given his multiple cardioembolic events from his infective endocarditis. --cont cardizem  Hypotension -In the setting of MRSA bacteremia his admission blood pressures were as low as 84/51 but have now improved -Improved with IV fluid resuscitation -Continue to monitor blood pressures per protocol; blood pressures are still on the softer side but last blood pressure reading was 160/110 -His home blood pressure medications include clonidine 0.2 mg p.o. twice daily as well  as metoprolol tartrate 50 mg p.o. twice daily which have both been held -Goal MAP bolus of at least 65 --avoid IVF and allow oral hydration  Acute metabolic encephalopathy in the setting of infection, improved  -He is noted to be alert and oriented x2 but was previously alert and oriented x4 -CT scan did not show any acute abnormalities; he does have a prior history of mitral valve endocarditis with septic emboli to the brain and to the lung  ESRD on Hemodialysis Monday Wednesday  Secondary hyperparathyroidism -Patient has a history of poorly cystic kidney disease -iHD per nephrology  Hyponatremia,  mild --monitor  Acute Respiratory Failure with Hypoxia, resolved -Was placed on 2 L supplemental oxygen via nasal cannula and does not wear supplemental oxygen at home -Chest x-ray yesterday showed "Lower lung volumes with atelectasis. Chronic cardiomegaly and right lung  Scarring." -Patient does have a history of MRSA bacteremia with septic emboli to the lungs --weaned down to RA  History of MRSA bacteremia with mitral valve endocarditis and PFO and septic emboli to the brain and left lung -He previously completed antibiotics on 11/25/2020 but now is back with another bacteremia as above so is now going to be on IV Vancomycin, IV Gentamicin, and IV Rifampin once Cx's clear   Anemia of chronic kidney disease --Hgb 9's --Monitor Hgb and transfuse to keep Hgb >7  Hypokalemia --monitor and replete PRN  Chronic Pain Syndrome with Chronic Opiate Dependence -He reports chronic back pain as well as right lower extremity that was recently amputated --cont oxycontin 15 mg q12h scheduled  Status post right AKA -Recently had an AKA done in the last 2 months -Continue with Pregabalin 75 mg p.o. nightly  Liver cirrhosis secondary to chronic hepatitis C -His HCC quantitative was 1,750,000 on 09/29/2020 -Infectious diseases been consulted given his MRSA bacteremia again and appreciate further evaluation recommendations.  GERD -Continue with PPI with pantoprazole 40 mg p.o. daily  Hyperbilirubinemia, mild --monitor   DVT prophylaxis: SCD Code Status: FULL CODE  Family Communication:  Disposition Plan: Pending further clinical work-up and evaluation by infectious diseases as well as nephrology, Neurology, cardiology and TCTS  Status is: Inpatient  Remains inpatient appropriate because:Unsafe d/c plan, IV treatments appropriate due to intensity of illness or inability to take PO and Inpatient level of care appropriate due to severity of illness   Dispo: The patient is from: Home               Anticipated d/c is to: Home              Patient currently is not medically stable to d/c.  Bacteremia not cleared.    Difficult to place patient No  Consultants:   Infectious Diseases  Nephrology  Cardiology   Neurology  Cardiothoracic Surgery  Palliative Care   Procedures:  TEE done by Dr. Debara Pickett  Findings:  1. LEFT VENTRICLE: The left ventricular wall thickness is moderately increased.  The left ventricular cavity is normal in size. Wall motion is normal.  LVEF is 60-65%.  2. RIGHT VENTRICLE:  The right ventricle is severely dilated without any thrombus or masses.    3. LEFT ATRIUM:  The left atrium is normal in size without any thrombus or masses.  There is not spontaneous echo contrast ("smoke") in the left atrium consistent with a low flow state.  4. LEFT ATRIAL APPENDAGE:  The large left atrial appendage is free of any thrombus or masses. The appendage has single lobes. Pulse doppler  indicates high flow in the appendage.  5. ATRIAL SEPTUM:  The atrial septum appears intact and is free of thrombus and/or masses.  There is no evidence for interatrial shunting by color doppler and saline microbubble.  6. RIGHT ATRIUM:  The right atrium is severely dilated in size and function without any thrombus or masses.  7. 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 There is  trivial regurgitation.  There is a possible small mobile vegetation adherent to the posterior leaflet which appears filamentous, this could also be residual valve tissue or suture.   8. AORTIC VALVE:  The aortic valve is trileaflet, normal in structure and function with no regurgitation.  The valve was visualized with 2D/3D modes - there appears to be a small mobile density at the base of the left coronary cusp seen in the long-axis view on the outflow side of the leaflet, possibly consistent with vegetation.  9. TRICUSPID VALVE:  The tricuspid valve is normal in  structure and function with Mild regurgitation.  There were no vegetations or stenosis  10.  PULMONIC VALVE:  The pulmonic valve is normal in structure and function with trivial regurgitation.  There were no vegetations or stenosis.   11. AORTIC ARCH, ASCENDING AND DESCENDING AORTA:  There was grade 2 Ron Parker et. Al, 1992) atherosclerosis of the ascending aorta, aortic arch, or proximal descending aorta.  12. PULMONARY VEINS: Anomalous pulmonary venous return was not noted.  13. PERICARDIUM: The pericardium appeared normal and non-thickened.  There is no pericardial effusion.  IMPRESSION:   1. 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  2. No LAA thrombus 3. Negative for PFO 4. Moderate LVH 5. Severe RA/RV dilitation with mild TR 6. LVEF 60-65%  RECOMMENDATIONS:    1. Consider longer course of antibiotics for possible valvular vegetations, including a small, mobile filamentous structure on the posterior bioprosthetic valve leaflet which is concerning for vegetation.  Antimicrobials:  Anti-infectives (From admission, onward)   Start     Dose/Rate Route Frequency Ordered Stop   12/29/20 1400  gentamicin (GARAMYCIN) IVPB 100 mg        100 mg 200 mL/hr over 30 Minutes Intravenous Every M-W-F 12/29/20 1312     12/29/20 0932  vancomycin (VANCOREADY) 750 MG/150ML IVPB       Note to Pharmacy: Judieth Keens  : cabinet override      12/29/20 0932 12/29/20 1114   12/27/20 1800  gentamicin (GARAMYCIN) IVPB 80 mg  Status:  Discontinued        80 mg 100 mL/hr over 30 Minutes Intravenous Every M-W-F (1800) 12/24/20 1612 12/29/20 1312   12/27/20 0813  vancomycin (VANCOREADY) 750 MG/150ML IVPB  Status:  Discontinued       Note to Pharmacy: Louretta Shorten   : cabinet override      12/27/20 0813 12/27/20 0815   12/24/20 1700  gentamicin (GARAMYCIN) IVPB 120 mg        120 mg 200 mL/hr over 30 Minutes Intravenous  Once 12/24/20  1612 12/24/20 1910   12/24/20 1430  vancomycin (VANCOCIN) IVPB 750 mg/150 ml premix        750 mg 150 mL/hr over 60 Minutes Intravenous Every M-W-F (Hemodialysis) 12/24/20 1324     12/22/20 1201  vancomycin variable dose per unstable renal function (pharmacist dosing)  Status:  Discontinued         Does not apply See admin instructions 12/22/20 1202 12/24/20 1324  12/22/20 1200  vancomycin (VANCOREADY) IVPB 750 mg/150 mL  Status:  Discontinued        750 mg 150 mL/hr over 60 Minutes Intravenous Every M-W-F (Hemodialysis) 12/22/20 0845 12/22/20 1202   12/22/20 1200  ceFEPIme (MAXIPIME) 2 g in sodium chloride 0.9 % 100 mL IVPB  Status:  Discontinued        2 g 200 mL/hr over 30 Minutes Intravenous Every M-W-F (Hemodialysis) 12/22/20 0845 12/23/20 1450   12/22/20 0715  vancomycin (VANCOREADY) IVPB 1500 mg/300 mL        1,500 mg 150 mL/hr over 120 Minutes Intravenous  Once 12/22/20 0714 12/22/20 1034   12/22/20 0715  ceFEPIme (MAXIPIME) 2 g in sodium chloride 0.9 % 100 mL IVPB        2 g 200 mL/hr over 30 Minutes Intravenous  Once 12/22/20 0714 12/22/20 0806         Subjective: Continued to spike fevers.  Pt reported eating ok, having BM's.  No urine output at baseline.   Objective: Vitals:   12/29/20 1130 12/29/20 1200 12/29/20 1220 12/29/20 1609  BP: (!) 136/110 (!) 159/109 (!) 148/100 (!) 119/106  Pulse: (!) 119  (!) 118 (!) 128  Resp:    19  Temp:   97.9 F (36.6 C) 100.3 F (37.9 C)  TempSrc:   Oral Oral  SpO2:   98% 95%  Weight:   67.8 kg   Height:        Intake/Output Summary (Last 24 hours) at 12/29/2020 1624 Last data filed at 12/29/2020 1220 Gross per 24 hour  Intake 480 ml  Output 3000 ml  Net -2520 ml   Filed Weights   12/28/20 0451 12/29/20 0803 12/29/20 1220  Weight: 71.1 kg 71.5 kg 67.8 kg   Examination: Physical Exam: Constitutional: NAD, AAOx3 HEENT: conjunctivae and lids normal, EOMI CV: No cyanosis.   RESP: normal respiratory effort, clear, on  RA Extremities: No effusions, edema in LLE, right AKA SKIN: warm, dry Neuro: II - XII grossly intact.   Psych: Normal mood and affect.  Appropriate judgement and reason   Data Reviewed: I have personally reviewed following labs and imaging studies  CBC: Recent Labs  Lab 12/25/20 0056 12/26/20 0117 12/27/20 0218 12/28/20 0222 12/29/20 0035  WBC 28.9* 23.8* 20.4* 18.8* 20.6*  NEUTROABS 23.7* 19.2* 15.3* 14.1* 17.1*  HGB 10.0* 9.7* 9.2* 9.6* 9.5*  HCT 32.8* 32.3* 30.7* 32.4* 31.0*  MCV 80.6 79.2* 78.9* 80.0 79.3*  PLT 262 315 330 337 [id number]   Basic Metabolic Panel: Recent Labs  Lab 12/25/20 0056 12/26/20 0117 12/27/20 0218 12/28/20 0222 12/29/20 0035  NA 137 133* 132* 134* 133*  133*  K 3.3* 3.4* 3.3* 3.6 4.1  4.0  CL 98 95* 95* 94* 94*  94*  CO2 '24 26 22 27 26  25  '$ GLUCOSE 90 107* 96 95 93  92  BUN 26* 37* 48* 25* 35*  34*  CREATININE 4.78* 5.95* 7.45* 4.94* 6.51*  6.40*  CALCIUM 8.7* 9.2 9.4 9.1 9.5  9.6  MG 2.0 2.2 2.3 2.1 2.2  PHOS 1.5* 2.4* 3.0 2.3* 3.7  3.7   GFR: Estimated Creatinine Clearance: 13.4 mL/min (A) (by C-G formula based on SCr of 6.4 mg/dL (H)). Liver Function Tests: Recent Labs  Lab 12/25/20 0056 12/26/20 0117 12/27/20 0218 12/28/20 0222 12/29/20 0035  AST '22 25 22 25 28  '$ ALT '10 9 10 10 11  '$ ALKPHOS 81 76 71 87 74  BILITOT 1.2 1.1 1.5* 0.8  1.9*  PROT 8.5* 8.4* 8.7* 8.3* 8.2*  ALBUMIN 2.2* 2.2* 2.1* 2.2* 2.2*  2.1*   No results for input(s): LIPASE, AMYLASE in the last 168 hours. No results for input(s): AMMONIA in the last 168 hours. Coagulation Profile: No results for input(s): INR, PROTIME in the last 168 hours. Cardiac Enzymes: No results for input(s): CKTOTAL, CKMB, CKMBINDEX, TROPONINI in the last 168 hours. BNP (last 3 results) No results for input(s): PROBNP in the last 8760 hours. HbA1C: No results for input(s): HGBA1C in the last 72 hours. CBG: Recent Labs  Lab 12/22/20 1804  GLUCAP 82   Lipid Profile: No  results for input(s): CHOL, HDL, LDLCALC, TRIG, CHOLHDL, LDLDIRECT in the last 72 hours. Thyroid Function Tests: No results for input(s): TSH, T4TOTAL, FREET4, T3FREE, THYROIDAB in the last 72 hours. Anemia Panel: No results for input(s): VITAMINB12, FOLATE, FERRITIN, TIBC, IRON, RETICCTPCT in the last 72 hours. Sepsis Labs: Recent Labs  Lab 12/26/20 1019 12/27/20 0218 12/28/20 0222  PROCALCITON 28.50 28.84 23.98    Recent Results (from the past 240 hour(s))  Resp Panel by RT-PCR (Flu A&B, Covid) Nasopharyngeal Swab     Status: None   Collection Time: 12/22/20  7:14 AM   Specimen: Nasopharyngeal Swab; Nasopharyngeal(NP) swabs in vial transport medium  Result Value Ref Range Status   SARS Coronavirus 2 by RT PCR NEGATIVE NEGATIVE Final    Comment: (NOTE) SARS-CoV-2 target nucleic acids are NOT DETECTED.  The SARS-CoV-2 RNA is generally detectable in upper respiratory specimens during the acute phase of infection. The lowest concentration of SARS-CoV-2 viral copies this assay can detect is 138 copies/mL. A negative result does not preclude SARS-Cov-2 infection and should not be used as the sole basis for treatment or other patient management decisions. A negative result may occur with  improper specimen collection/handling, submission of specimen other than nasopharyngeal swab, presence of viral mutation(s) within the areas targeted by this assay, and inadequate number of viral copies(<138 copies/mL). A negative result must be combined with clinical observations, patient history, and epidemiological information. The expected result is Negative.  Fact Sheet for Patients:  EntrepreneurPulse.com.au  Fact Sheet for Healthcare Providers:  IncredibleEmployment.be  This test is no t yet approved or cleared by the [place] FDA and  has been authorized for detection and/or diagnosis of SARS-CoV-2 by FDA under an Emergency Use Authorization  (EUA). This EUA will remain  in effect (meaning this test can be used) for the duration of the COVID-19 declaration under Section 564(b)(1) of the Act, 21 U.S.C.section 360bbb-3(b)(1), unless the authorization is terminated  or revoked sooner.       Influenza A by PCR NEGATIVE NEGATIVE Final   Influenza B by PCR NEGATIVE NEGATIVE Final    Comment: (NOTE) The Xpert Xpress SARS-CoV-2/FLU/RSV plus assay is intended as an aid in the diagnosis of influenza from Nasopharyngeal swab specimens and should not be used as a sole basis for treatment. Nasal washings and aspirates are unacceptable for Xpert Xpress SARS-CoV-2/FLU/RSV testing.  Fact Sheet for Patients: EntrepreneurPulse.com.au  Fact Sheet for Healthcare Providers: IncredibleEmployment.be  This test is not yet approved or cleared by the [place] FDA and has been authorized for detection and/or diagnosis of SARS-CoV-2 by FDA under an Emergency Use Authorization (EUA). This EUA will remain in effect (meaning this test can be used) for the duration of the COVID-19 declaration under Section 564(b)(1) of the Act, 21 U.S.C. section 360bbb-3(b)(1), unless the authorization is terminated or revoked.  Performed at [facility], [place] [address], [place], [place] [address]   Blood Culture (routine x 2)     Status: Abnormal   Collection Time: 12/22/20  7:14 AM   Specimen: BLOOD  Result Value Ref Range Status   Specimen Description BLOOD SITE NOT SPECIFIED  Final   Special Requests   Final    BOTTLES DRAWN AEROBIC AND ANAEROBIC Blood Culture adequate volume   Culture  Setup Time   Final    GRAM POSITIVE COCCI IN CLUSTERS IN BOTH AEROBIC AND ANAEROBIC BOTTLES CRITICAL RESULT CALLED TO, READ BACK BY AND VERIFIED WITHDion Body [facility] 2028 12/22/20 A BROWNING Performed at [facility], [place] [address] [place], [place] [address]    Culture METHICILLIN RESISTANT STAPHYLOCOCCUS AUREUS (A)   Final   Report Status 12/24/2020 FINAL  Final   Organism ID, Bacteria METHICILLIN RESISTANT STAPHYLOCOCCUS AUREUS  Final      Susceptibility   Methicillin resistant staphylococcus aureus - MIC*    CIPROFLOXACIN 4 RESISTANT Resistant     ERYTHROMYCIN >=8 RESISTANT Resistant     GENTAMICIN <=0.5 SENSITIVE Sensitive     OXACILLIN >=4 RESISTANT Resistant     TETRACYCLINE <=1 SENSITIVE Sensitive     VANCOMYCIN 1 SENSITIVE Sensitive     TRIMETH/SULFA <=10 SENSITIVE Sensitive     CLINDAMYCIN <=0.25 SENSITIVE Sensitive     RIFAMPIN <=0.5 SENSITIVE Sensitive     Inducible Clindamycin NEGATIVE Sensitive     * METHICILLIN RESISTANT STAPHYLOCOCCUS AUREUS  Blood Culture ID Panel (Reflexed)     Status: Abnormal   Collection Time: 12/22/20  7:14 AM  Result Value Ref Range Status   Enterococcus faecalis NOT DETECTED NOT DETECTED Final   Enterococcus Faecium NOT DETECTED NOT DETECTED Final   Listeria monocytogenes NOT DETECTED NOT DETECTED Final   Staphylococcus species DETECTED (A) NOT DETECTED Final    Comment: CRITICAL RESULT CALLED TO, READ BACK BY AND VERIFIED WITH: J MILLEN PHARMD 2028 12/22/20 A BROWNING    Staphylococcus aureus (BCID) DETECTED (A) NOT DETECTED Final    Comment: Methicillin (oxacillin)-resistant Staphylococcus aureus (MRSA). MRSA is predictably resistant to beta-lactam antibiotics (except ceftaroline). Preferred therapy is vancomycin unless clinically contraindicated. Patient requires contact precautions if  hospitalized. CRITICAL RESULT CALLED TO, READ BACK BY AND VERIFIED WITH: Dion Body PHARMD 2028 12/22/20 A BROWNING    Staphylococcus epidermidis NOT DETECTED NOT DETECTED Final   Staphylococcus lugdunensis NOT DETECTED NOT DETECTED Final   Streptococcus species NOT DETECTED NOT DETECTED Final   Streptococcus agalactiae NOT DETECTED NOT DETECTED Final   Streptococcus pneumoniae NOT DETECTED NOT DETECTED Final   Streptococcus pyogenes NOT DETECTED NOT DETECTED Final    A.calcoaceticus-baumannii NOT DETECTED NOT DETECTED Final   Bacteroides fragilis NOT DETECTED NOT DETECTED Final   Enterobacterales NOT DETECTED NOT DETECTED Final   Enterobacter cloacae complex NOT DETECTED NOT DETECTED Final   Escherichia coli NOT DETECTED NOT DETECTED Final   Klebsiella aerogenes NOT DETECTED NOT DETECTED Final   Klebsiella oxytoca NOT DETECTED NOT DETECTED Final   Klebsiella pneumoniae NOT DETECTED NOT DETECTED Final   Proteus species NOT DETECTED NOT DETECTED Final   Salmonella species NOT DETECTED NOT DETECTED Final   Serratia marcescens NOT DETECTED NOT DETECTED Final   Haemophilus influenzae NOT DETECTED NOT DETECTED Final   Neisseria meningitidis NOT DETECTED NOT DETECTED Final   Pseudomonas aeruginosa NOT DETECTED NOT DETECTED Final   Stenotrophomonas maltophilia NOT DETECTED NOT DETECTED Final   Candida albicans NOT DETECTED NOT DETECTED Final   Candida  auris NOT DETECTED NOT DETECTED Final   Candida glabrata NOT DETECTED NOT DETECTED Final   Candida krusei NOT DETECTED NOT DETECTED Final   Candida parapsilosis NOT DETECTED NOT DETECTED Final   Candida tropicalis NOT DETECTED NOT DETECTED Final   Cryptococcus neoformans/gattii NOT DETECTED NOT DETECTED Final   Meth resistant mecA/C and MREJ DETECTED (A) NOT DETECTED Final    Comment: CRITICAL RESULT CALLED TO, READ BACK BY AND VERIFIED WITHDion Body [facility] 2028 12/22/20 A BROWNING Performed at [facility], [address], [place], [place] [address]   Blood Culture (routine x 2)     Status: Abnormal   Collection Time: 12/22/20  7:19 AM   Specimen: BLOOD  Result Value Ref Range Status   Specimen Description BLOOD SITE NOT SPECIFIED  Final   Special Requests   Final    BOTTLES DRAWN AEROBIC AND ANAEROBIC Blood Culture results may not be optimal due to an excessive volume of blood received in culture bottles   Culture  Setup Time   Final    GRAM POSITIVE COCCI IN CLUSTERS IN BOTH AEROBIC AND  ANAEROBIC BOTTLES IDENTIFICATION TO FOLLOW CRITICAL RESULT CALLED TO, READ BACK BY AND VERIFIED WITH: J MILLEN PHARMD 2028 12/22/20 A BROWNING    Culture (A)  Final    STAPHYLOCOCCUS AUREUS SUSCEPTIBILITIES PERFORMED ON PREVIOUS CULTURE WITHIN THE LAST 5 DAYS. Performed at [facility], [place] [address] [place], [place] [address]    Report Status 12/24/2020 FINAL  Final  Culture, blood (routine x 2)     Status: None   Collection Time: 12/24/20  3:10 PM   Specimen: BLOOD RIGHT HAND  Result Value Ref Range Status   Specimen Description BLOOD RIGHT HAND  Final   Special Requests   Final    BOTTLES DRAWN AEROBIC ONLY Blood Culture adequate volume   Culture   Final    NO GROWTH 5 DAYS Performed at [facility], [place] [address], [place], [place] [address]    Report Status 12/29/2020 FINAL  Final  Culture, blood (routine x 2)     Status: None   Collection Time: 12/24/20  3:37 PM   Specimen: BLOOD  Result Value Ref Range Status   Specimen Description BLOOD BLOOD RIGHT FOREARM  Final   Special Requests   Final    BOTTLES DRAWN AEROBIC ONLY Blood Culture adequate volume   Culture   Final    NO GROWTH 5 DAYS Performed at [facility], [place] [address], [place], [place] [address]    Report Status 12/29/2020 FINAL  Final  MRSA PCR Screening     Status: None   Collection Time: 12/25/20 10:18 PM   Specimen: Nasal Mucosa; Nasopharyngeal  Result Value Ref Range Status   MRSA by PCR NEGATIVE NEGATIVE Final    Comment:        The GeneXpert MRSA Assay (FDA approved for NASAL specimens only), is one component of a comprehensive MRSA colonization surveillance program. It is not intended to diagnose MRSA infection nor to guide or monitor treatment for MRSA infections. Performed at [facility], [place] [address] [place], [place] [address]   Culture, blood (routine x 2)     Status: Abnormal   Collection Time: 12/26/20 10:19 AM   Specimen: BLOOD RIGHT HAND  Result Value  Ref Range Status   Specimen Description BLOOD RIGHT HAND  Final   Special Requests   Final    BOTTLES DRAWN AEROBIC ONLY  Blood Culture results may not be optimal due to an inadequate volume of blood received in culture bottles   Culture  Setup Time   Final    GRAM POSITIVE COCCI IN CLUSTERS AEROBIC BOTTLE ONLY CRITICAL RESULT CALLED TO, READ BACK BY AND VERIFIED WITH: PHARMD TM TUCKER [id number] AT 32 BY CM    Culture (A)  Final    STAPHYLOCOCCUS AUREUS SUSCEPTIBILITIES PERFORMED ON PREVIOUS CULTURE WITHIN THE LAST 5 DAYS. Performed at [facility], [place] [address], [place], [place] [address]    Report Status 12/29/2020 FINAL  Final  Culture, blood (routine x 2)     Status: Abnormal (Preliminary result)   Collection Time: 12/26/20 10:19 AM   Specimen: BLOOD RIGHT WRIST  Result Value Ref Range Status   Specimen Description BLOOD RIGHT WRIST  Final   Special Requests   Final    BOTTLES DRAWN AEROBIC ONLY Blood Culture results may not be optimal due to an inadequate volume of blood received in culture bottles   Culture  Setup Time   Final    GRAM POSITIVE COCCI IN CLUSTERS AEROBIC BOTTLE ONLY CRITICAL RESULT CALLED TO, READ BACK BY AND VERIFIED WITH: PHARMD M TUCKER [id number] AT 2 BY CM    Culture (A)  Final    METHICILLIN RESISTANT STAPHYLOCOCCUS AUREUS CULTURE REINCUBATED FOR BETTER GROWTH Performed at [facility], [place] [address] [place], [place] [address]    Report Status PENDING  Incomplete   Organism ID, Bacteria METHICILLIN RESISTANT STAPHYLOCOCCUS AUREUS  Final      Susceptibility   Methicillin resistant staphylococcus aureus - MIC*    CIPROFLOXACIN >=8 RESISTANT Resistant     ERYTHROMYCIN >=8 RESISTANT Resistant     GENTAMICIN <=0.5 SENSITIVE Sensitive     OXACILLIN >=4 RESISTANT Resistant     TETRACYCLINE <=1 SENSITIVE Sensitive     VANCOMYCIN 1 SENSITIVE Sensitive     TRIMETH/SULFA <=10 SENSITIVE Sensitive     CLINDAMYCIN <=0.25 SENSITIVE Sensitive      RIFAMPIN <=0.5 SENSITIVE Sensitive     Inducible Clindamycin NEGATIVE Sensitive     * METHICILLIN RESISTANT STAPHYLOCOCCUS AUREUS  Culture, blood (routine x 2)     Status: None (Preliminary result)   Collection Time: 12/27/20  2:17 PM   Specimen: BLOOD  Result Value Ref Range Status   Specimen Description BLOOD THUMB  Final   Special Requests   Final    BOTTLES DRAWN AEROBIC AND ANAEROBIC Blood Culture adequate volume   Culture   Final    NO GROWTH 2 DAYS Performed at [facility], [address] [place] [address], [place], [place] [address]    Report Status PENDING  Incomplete  Culture, blood (routine x 2)     Status: Abnormal (Preliminary result)   Collection Time: 12/27/20  2:17 PM   Specimen: BLOOD RIGHT HAND  Result Value Ref Range Status   Specimen Description BLOOD RIGHT HAND  Final   Special Requests   Final    BOTTLES DRAWN AEROBIC AND ANAEROBIC Blood Culture adequate volume   Culture  Setup Time   Final    GRAM POSITIVE COCCI IN CLUSTERS ANAEROBIC BOTTLE ONLY CRITICAL VALUE NOTED.  VALUE IS CONSISTENT WITH PREVIOUSLY REPORTED AND CALLED VALUE.    Culture (A)  Final    STAPHYLOCOCCUS AUREUS CULTURE REINCUBATED FOR BETTER GROWTH Performed at [facility], [place] [address] [place], [place] [address]    Report Status PENDING  Incomplete    RN Pressure Injury Documentation:  Estimated body mass index is 20.27 kg/m as calculated from the following:   Height as of this encounter: 6' (1.829 m).   Weight as of this encounter: 67.8 kg.  Malnutrition Type:   Malnutrition Characteristics:   Nutrition Interventions:   Radiology Studies: No results found. Scheduled Meds: . (feeding supplement) PROSource Plus  30 mL Oral BID BM  . Chlorhexidine Gluconate Cloth  6 each Topical Q0600  . cinacalcet  60 mg Oral Q supper  . diltiazem  120 mg Oral Daily  . docusate sodium  100 mg Oral BID  . feeding supplement (NEPRO CARB STEADY)  237 mL Oral TID BM  . methocarbamol  500 mg  Oral TID  . multivitamin with minerals  1 tablet Oral Daily  . oxyCODONE  15 mg Oral Q12H  . pantoprazole  40 mg Oral Daily  . pregabalin  75 mg Oral QHS  . senna-docusate  1 tablet Oral BID  . sodium chloride flush  3 mL Intravenous Q12H   Continuous Infusions: . gentamicin 100 mg (12/29/20 1401)  . vancomycin Stopped (12/27/20 1214)    LOS: 7 days   Enzo Bi, md [facility] PAGER is on [place]  If 7PM-7AM, please contact night-coverage [email or web address]

## 2020-12-29 NOTE — Progress Notes (Signed)
Pharmacy Antibiotic Note  Patrick Anthony is a 50 y.o. male admitted on 12/22/2020 with MRSA bacteremia. He was found to have possible small vegetations associated with the bioprosthetic mitral valve and native aortic valve on TEE today.  Pharmacy has been consulted for gentamicin dosing for prosthetic valve endocarditis. Patient is a HD patient with sessions on MWF. Last HD was this morning. Vd for Gent = 29.    Pt with complicated MRSA endocarditis and discitis who is currently on vanc/gent for his infection. Persistent bacteremia with repeat cultures on 5/23 positive again. His pre-HD vanc level came back at 24 today (within goal). His PreHD gentamicin came back as 2.7 (goal 3-4). We will adjust the dose slightly. May need to change abx combination based on ID recommendation.   Plan: Increase gent to 100 mg every MWF after HD  Goal pre-HD level 3-4 Monitor weekly level  Will also plan to add rifampin once blood cultures clear  Height: 6' (182.9 cm) Weight: 67.8 kg (149 lb 7.6 oz) IBW/kg (Calculated) : 77.6  Temp (24hrs), Avg:99.3 F (37.4 C), Min:97.9 F (36.6 C), Max:102.5 F (39.2 C)  Recent Labs  Lab 12/24/20 0347 12/25/20 0056 12/26/20 0117 12/27/20 0218 12/28/20 0222 12/29/20 0035  WBC 21.1* 28.9* 23.8* 20.4* 18.8* 20.6*  CREATININE 6.91*  6.83* 4.78* 5.95* 7.45* 4.94* 6.51*  6.40*  VANCORANDOM 22  --   --   --   --  24  GENTRANDOM  --   --   --   --   --  2.7    Estimated Creatinine Clearance: 13.4 mL/min (A) (by C-G formula based on SCr of 6.4 mg/dL (H)).    No Known Allergies  Onnie Boer, PharmD, BCIDP, AAHIVP, CPP Infectious Disease Pharmacist 12/29/2020 1:12 PM

## 2020-12-29 NOTE — Progress Notes (Signed)
[facility] Progress Note   Dialysis Orders: MWF -South 4hrs, [id number], DFR500, EDW 72kg,2K/2CaProfile 2  Access:LU AVG vs BCF Heparin5500 unit qHD Mircera228mg q2wks - last 5/9 Venofer '100mg'$  qHD x5 - 1 completed  Assessment/ Plan:   1. 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  2. 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 WBCs 28 -> 20.4. On  Vancomycin and Gent per pharmacy. s/p repeat TEE with MV vegetations.  MRI thoracic spine -> concerning for osteo/discitis T10/11.  Per primary team. ID following.  3. Back pain -MRI lumbar spine with no discitis/osteomyelitis, +degenerative disc disease at L4-L5, L5-S1. ID plans for MRI T spine. 4. Hypoxia-resolved but req O2 [place] at night.  5. ESRD- On HD MWF. Continue on schedule.  HD 5/23 net UF 3.5L  Next HD today; tolerated UF Monday and hopefully he will do so today. Certainly has volume onboard.  6. Hypertension/volume-BP elevated. Resume home meds. Under dry weight if weights correct. UF has been limited by hypotension. Tolerated 3L UF on Friday and 3.5L Mon 7. Anemiaof CKD- Hgb 9.7- Next ESA dose due 5/23. .Marland KitchenHold IV iron due to acute infection. 8. Secondary Hyperparathyroidism -Ca in goal.Phos low - binders stopped -> phos 3 5/23. Continue sensipar '60mg'$  qd. Not on VDRA. 9. Nutrition- Renal diet w/fluid restriction. Protein supplement.   Subjective:   No new events, only  Sob after movement this am going to restroom. He had fevers overnight.    Objective:   BP (!) 154/105 (BP Location: Right Arm)   Pulse (!) 109   Temp 97.9 F (36.6 C) (Oral)   Resp 18   Ht 6' (1.829 m)   Wt 71.1 kg   SpO2 96%   BMI 21.26 kg/m   Intake/Output Summary (Last 24 hours) at 12/29/2020 0715 Last data filed at 12/28/2020 2100 Gross per 24 hour  Intake 480 ml   Output --  Net 480 ml   Weight change:   Physical Exam: General: alert, oriented x3, male patient in NAD, sitting on side of  bed Heart: Tachy, regular, no mrg Lungs: clear bilaterally, occasional rhonchi  Abdomen:soft, NTND Extremities: R AKA, no edema Dialysis Access: LU BCF w/ bypass graft strong bruit  Imaging: No results found.  Labs: BMET Recent Labs  Lab 12/23/20 0119 12/24/20 0347 12/25/20 0056 12/26/20 0117 12/27/20 0218 12/28/20 0222 12/29/20 0035  NA 135 133*  133* 137 133* 132* 134* 133*  133*  K 3.4* 3.9  3.9 3.3* 3.4* 3.3* 3.6 4.1  4.0  CL 95* 92*  92* 98 95* 95* 94* 94*  94*  CO2 '25 27  27 24 26 22 27 26  25  '$ GLUCOSE 66* 105*  106* 90 107* 96 95 93  92  BUN 29* 43*  42* 26* 37* 48* 25* 35*  34*  CREATININE 4.91* 6.91*  6.83* 4.78* 5.95* 7.45* 4.94* 6.51*  6.40*  CALCIUM 8.4* 9.1  9.2 8.7* 9.2 9.4 9.1 9.5  9.6  PHOS 1.8* 1.8*  1.8* 1.5* 2.4* 3.0 2.3* 3.7  3.7   CBC Recent Labs  Lab 12/26/20 0117 12/27/20 0218 12/28/20 0222 12/29/20 0035  WBC 23.8* 20.4* 18.8* 20.6*  NEUTROABS 19.2* 15.3* 14.1* 17.1*  HGB 9.7* 9.2* 9.6* 9.5*  HCT 32.3* 30.7* 32.4* 31.0*  MCV 79.2* 78.9* 80.0 79.3*  PLT 315 330 337 317  Medications:    . (feeding supplement) PROSource Plus  30 mL Oral BID BM  . Chlorhexidine Gluconate Cloth  6 each Topical Q0600  . cinacalcet  60 mg Oral Q supper  . diltiazem  120 mg Oral Daily  . docusate sodium  100 mg Oral BID  . feeding supplement (NEPRO CARB STEADY)  237 mL Oral TID BM  . methocarbamol  500 mg Oral TID  . multivitamin with minerals  1 tablet Oral Daily  . oxyCODONE  15 mg Oral Q12H  . pantoprazole  40 mg Oral Daily  . pregabalin  75 mg Oral QHS  . senna-docusate  1 tablet Oral BID  . sodium chloride flush  3 mL Intravenous Q12H      Otelia Santee, MD 12/29/2020, 7:15 AM

## 2020-12-30 DIAGNOSIS — N186 End stage renal disease: Secondary | ICD-10-CM | POA: Diagnosis not present

## 2020-12-30 LAB — CBC
HCT: 30.8 % — ABNORMAL LOW (ref 39.0–52.0)
Hemoglobin: 9.2 g/dL — ABNORMAL LOW (ref 13.0–17.0)
MCH: 23.8 pg — ABNORMAL LOW (ref 26.0–34.0)
MCHC: 29.9 g/dL — ABNORMAL LOW (ref 30.0–36.0)
MCV: 79.6 fL — ABNORMAL LOW (ref 80.0–100.0)
Platelets: 330 10*3/uL (ref 150–400)
RBC: 3.87 MIL/uL — ABNORMAL LOW (ref 4.22–5.81)
RDW: 21.3 % — ABNORMAL HIGH (ref 11.5–15.5)
WBC: 19.4 10*3/uL — ABNORMAL HIGH (ref 4.0–10.5)
nRBC: 0 % (ref 0.0–0.2)

## 2020-12-30 LAB — BASIC METABOLIC PANEL
Anion gap: 12 (ref 5–15)
BUN: 22 mg/dL — ABNORMAL HIGH (ref 6–20)
CO2: 25 mmol/L (ref 22–32)
Calcium: 8.7 mg/dL — ABNORMAL LOW (ref 8.9–10.3)
Chloride: 94 mmol/L — ABNORMAL LOW (ref 98–111)
Creatinine, Ser: 4.37 mg/dL — ABNORMAL HIGH (ref 0.61–1.24)
GFR, Estimated: 16 mL/min — ABNORMAL LOW (ref >60)
Glucose, Bld: 83 mg/dL (ref 70–99)
Potassium: 4 mmol/L (ref 3.5–5.1)
Sodium: 131 mmol/L — ABNORMAL LOW (ref 135–145)

## 2020-12-30 LAB — HEPATITIS C GENOTYPE

## 2020-12-30 LAB — MAGNESIUM: Magnesium: 2 mg/dL (ref 1.7–2.4)

## 2020-12-30 NOTE — Progress Notes (Signed)
    Progress Note from the Palliative Medicine Team at [facility]   Patient Name: Patrick Anthony        Date: 12-29-20  DOB: 23-Feb-1971  Age: 50 y.o. MRN#: [id number] Attending Physician: Enzo Bi, MD Primary Care Physician: [name], MD Admit Date: 12/22/2020   Medical records reviewed   Blood cultures on 5/22 and 5/23 were positive for MRSA after staff aureus cleared from blood cultures on 5/18.  Not a surgical candidate for prosthetic valve endocarditis.  WBC is improving, procalcitonin improving.  Continues on antibiotics.  Recommendations for outpatient treatment of hepatitis C.  Remains on hemodialysis for ESRD.  This NP  Walden Field NP visited patient at the bedside as a follow up to  yesterday's [place].  Patient is feeling well today, no new complaints.  He is inquiring about his status and the plan.  Discussed the likelihood that things would not change drastically day-to-day with a chronic, long-term infection.  He verbalized understanding.  He did get to speak to his daughter today.  Has not spoken to his grandchildren yet.  Expressed no needs or concerns at this time.  Discussed with patient the importance of continued conversation with family and their  medical providers regarding overall plan of care and treatment options,  ensuring decisions are within the context of the patients values and GOCs.  Questions and concerns addressed.  Total time spent on the unit was 15 minutes  Greater than 50% of the time was spent in counseling and coordination of care  Present and in agreement with above assessment and plan   Wadie Lessen NP  Palliative Medicine Team Team Phone # [phone] Pager [phone]

## 2020-12-30 NOTE — Progress Notes (Signed)
[facility] Progress Note   Dialysis Orders: MWF -South 4hrs, [id number], DFR500, EDW 72kg,2K/2CaProfile 2  Access:LU AVG vs BCF Heparin5500 unit qHD Mircera246mg q2wks - last 5/9 Venofer '100mg'$  qHD x5 - 1 completed  Assessment/ Plan:   1. 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  2. 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 WBCs 28 -> 20.4. On  Vancomycin and Gent per pharmacy. s/p repeat TEE with MV vegetations.  MRI thoracic spine -> concerning for osteo/discitis T10/11.  Per primary team. ID following.  3. Back pain -MRI lumbar spine with no discitis/osteomyelitis, +degenerative disc disease at L4-L5, L5-S1. ID plans for MRI T spine. 4. Hypoxia-resolved but req O2 [place] at night.  5. ESRD- On HD MWF. Continue on schedule.   HD 5/23 net UF 3.5L, 5/25 3L  Next HD tomorrow.   6. Hypertension/volume-BP elevated. Resume home meds. Under dry weight if weights correct. UF has been limited by hypotension. Tolerating UF on HD fortunately. 7. Anemiaof CKD- Hgb 9.7- Next ESA dose due 5/23. .Marland KitchenHold IV iron due to acute infection. 8. Secondary Hyperparathyroidism -Ca in goal.Phos low - binders stopped -> phos 3 5/23. Continue sensipar '60mg'$  qd. Not on VDRA. 9. Nutrition- Renal diet w/fluid restriction. Protein supplement.   Subjective:   No new events, mild Sob but improved after HD yesterday. Denies subjective fevers overnight. On review of chart no fevers overnight but tachy.   Objective:   BP (!) 141/110 (BP Location: Right Arm)   Pulse (!) 126   Temp 98.7 F (37.1 C) (Oral)   Resp 20   Ht 6' (1.829 m)   Wt 69.8 kg   SpO2 92%   BMI 20.87 kg/m   Intake/Output Summary (Last 24 hours) at 12/30/2020 [id number] data filed at 12/29/2020 2040 Gross per 24 hour  Intake 60 ml  Output 3000 ml  Net -2940 ml    Weight change:   Physical Exam: General: alert, oriented x3, male patient in NAD, sitting on side of  bed Heart: Tachy, regular, no mrg Lungs: clear bilaterally, occasional rhonchi  Abdomen:soft, NTND Extremities: R AKA, no edema Dialysis Access: LU BCF w/ bypass graft strong bruit  Imaging: No results found.  Labs: BMET Recent Labs  Lab 12/24/20 0347 12/25/20 0056 12/26/20 0117 12/27/20 0218 12/28/20 0222 12/29/20 0035 12/30/20 0158  NA 133*  133* 137 133* 132* 134* 133*  133* 131*  K 3.9  3.9 3.3* 3.4* 3.3* 3.6 4.1  4.0 4.0  CL 92*  92* 98 95* 95* 94* 94*  94* 94*  CO2 '27  27 24 26 22 27 26  25 25  '$ GLUCOSE 105*  106* 90 107* 96 95 93  92 83  BUN 43*  42* 26* 37* 48* 25* 35*  34* 22*  CREATININE 6.91*  6.83* 4.78* 5.95* 7.45* 4.94* 6.51*  6.40* 4.37*  CALCIUM 9.1  9.2 8.7* 9.2 9.4 9.1 9.5  9.6 8.7*  PHOS 1.8*  1.8* 1.5* 2.4* 3.0 2.3* 3.7  3.7  --    CBC Recent Labs  Lab 12/26/20 0117 12/27/20 0218 12/28/20 0222 12/29/20 0035 12/30/20 0158  WBC 23.8* 20.4* 18.8* 20.6* 19.4*  NEUTROABS 19.2* 15.3* 14.1* 17.1*  --   HGB 9.7* 9.2* 9.6* 9.5* 9.2*  HCT 32.3* 30.7* 32.4* 31.0* 30.8*  MCV 79.2* 78.9* 80.0 79.3* 79.6*  PLT 315 330  337 317 330    Medications:    . (feeding supplement) PROSource Plus  30 mL Oral BID BM  . Chlorhexidine Gluconate Cloth  6 each Topical Q0600  . cinacalcet  60 mg Oral Q supper  . diltiazem  120 mg Oral Daily  . docusate sodium  100 mg Oral BID  . feeding supplement (NEPRO CARB STEADY)  237 mL Oral TID BM  . methocarbamol  500 mg Oral TID  . multivitamin with minerals  1 tablet Oral Daily  . oxyCODONE  15 mg Oral Q12H  . pantoprazole  40 mg Oral Daily  . pregabalin  75 mg Oral QHS  . senna-docusate  1 tablet Oral BID  . sodium chloride flush  3 mL Intravenous Q12H      Otelia Santee, MD 12/30/2020, 7:08 AM

## 2020-12-30 NOTE — Progress Notes (Signed)
PROGRESS NOTE    Patrick Anthony  [id number] DOB: 03-30-1971 DOA: 12/22/2020 PCP: [name], MD   Brief Narrative:  The patient is a 82 atrial African-American male with a past medical history significant for but not limited to end-stage renal disease on hemodialysis on Monday Wednesday Friday, chronic diastolic CHF, hyperlipidemia, history of liver cirrhosis secondary to hepatitis C, history of alcohol abuse, OSA as well as recent AKA done by Dr. Sharol Given on 10/29/2020 and other comorbidities who presented with a chief complaint of spiking fevers for last few days and then altered mental status.  Reportedly patient was at his facility when they were noticing that he was altered and history is difficult to be obtained from the patient himself.  Of note he has had a prolonged hospitalization from 09/27/20 until November 11, 2019 he was found to have MRSA bacteremia as well as mitral valve endocarditis with PFO and septic emboli to the brain along with that time.  He underwent a bioprosthetic mitral valve replacement along with closure of the PFO on 10/14/2020.  Also during that hospitalization he was treated for osteomyelitis and abscess of the right tibia status post AKA on 10/29/2020 by Dr. Sharol Given.  He was discharged to [facility] for continuation of vancomycin at the nursing facility until 11/25/2020.  Reportedly he is usually alert and oriented x4.  Yesterday he is only alert and oriented to person and place but not the situation.  He complained about severe pain in his back has been present for some time and he describes shooting pains in the right leg but was amputated.  He denies any shortness breath or cough.  In the ED he presented with a fever of 103 and rales were reported in the lower lung field on the right lung.  Upon arrival to the ED he was noted to be septic given that he had an elevated temperature, respirations as well as being tachycardia and had a new O2 requirement as well as being altered.   CT scan of the brain showed no acute abnormalities but did show several changes that were chronic injury to the areas of infarct noted on the previous MRI.  His WBC was elevated 20,200.  Chest x-ray showed scarring in the right upper lobe, middle lobe atelectasis and a small left-sided pleural effusion stable cardiomegaly.  Initial sepsis protocol was initiated and he was given IV vancomycin and cefepime.  Further work-up reveals that he has another MRSA bacteremia and infectious diseases is involved again and nephrology is involved as well.  Patient is to get a repeat TEE and this was done today 12/24/20 to evaluate his mitral valve. Still pending to get a MRI the T-spine with and without contrast with hemodialysis after.  UnFortunately a TEE shows likely bioprosthetic valve infection so infectious disease is now going to add gentamicin and rifampin once his cultures are cleared.  We will still pursue his thoracic spine MRI which is still pending to be done.  Overnight the patient developed blurred vision and he underwent a head MRI which showed recurrent cardioembolic strokes in the setting of septic emboli from infective endocarditis.  Neurology was consulted and they did not recommend further work-up given that he recently just had an MRI of the brain with and without contrast on 09/28/2020 as well as an echo and carotid Dopplers.  Neurology recommending holding antiplatelets given the risk of hemorrhagic conversion of recurrent infective endocarditis related strokes and resume in 1 week if patient remains stable.  Patient's aspirin was then discontinued.  MRI of the spine shows early discitis-osteomyelitis at the T10-T11 level with mild perivertebral soft tissue edema at this level and no evidence of epidural fluid collection or abscess.  There is also moderate endplate marrow edema centered at C6-7 intervertebral disc space similar to previous MRI and small bilateral pleural effusion.  Cardiothoracic  surgery evaluated and they feel that he is a candidate for redo mitral valve replacement and aortic valve replacement 3 months following previous surgery as they do not feel that he would survive this.  They recommend continuing antibiotic therapy.  On the morning of 12/26/20 he went into SVT and heart rates spiked in the 150s and he was given at 250 mL bolus, tried vagal maneuvers which did not break his rhythm and he was given Tylenol for his temperature of 102.8.  Cardiology was consulted and he was given adenosine 6 mg and then it looked like 2-1 flutter and he was placed on a diltiazem drip and bolus.  Cardiology feels that he cannot be anticoagulated this time given his risk for hemorrhagic conversion.  Further review of telemetry showed that his heart rate may just represent sinus tachycardia and as there is no clear flutter waves and now cardiology transition him to oral diltiazem.  He Continues to spike temperatures and next hemodialysis session per nephrology.  Palliative care consulted and current goals of care discussion is still aggressive and full care p.o. diltiazem to 120 mg daily  Assessment & Plan:   Principal Problem:   Bacteremia due to Staphylococcus aureus Active Problems:   Anemia in chronic kidney disease   Hepatitis C   ESRD on dialysis (HCC)   S/P mitral valve replacement   S/P AKA (above knee amputation), right (HCC)   Personal history of Methicillin resistant Staphylococcus aureus infection   SIRS (systemic inflammatory response syndrome) (HCC)  Severe sepsis secondary to an MRSA bacteremia and Prostetic Valve Endocarditis present on admission -Presented the fever up to 103 F, tachycardia, tachypnea, a new oxygen requirement, altered mental status as well as a leukocytosis of 20,200 and also with hypotension -Chest x-ray on admission only noted low lung volumes and possible signs of atelectasis with no clear source of infection appreciated at that time -Blood  cultures now revealing an MRSA bacteremia; Repeat Blood Cx showed no growth to date but will obtain another set today given that he became febrile and tachycardic -He has been admitted progressive bed -He was initiated on IV antibiotics with vancomycin and cefepime but cefepime has now been discontinued -Infectious diseases has been consulted and plan further work-up for the patient and have ordered a repeat TEE and discussed with cardiology given his prostatic mitral valve and also will be getting a T-spine MRI with and without contrast with dialysis to be done afterwards; MRI done and showed that he has early changes of discitis and osteomyelitis at the T10-T11 level -Patient did get TEE which showed "Possible small vegetations associated with the bioprosthetic mitral valve and native aortic valve. No LAA thrombus. Negative for PFO. Moderate LVH Severe RA/RV dilitation with mild TR LVEF 60-65%." -We will be continuing vancomycin but because of Concern for possible Valvular Vegatations including a small, moblile Filamentous Structure on the posterior bioprosthetic vavlve leaflet was concerning for vegetation, ID now recommending PVE Coverage and will start Gentamicin and Add Rifampin once CX's Clear -procalcitonin level was 28.50 -> 28.84 -> 23.98 -CRP was 17.2 -Repeat blood cultures have been planned for 12/24/2020 -Patient continues  to have persistent sepsis physiology as he continues to spike temperatures and becomes tachycardic and tachypneic in the setting of his bacteremia -Appreciate further ID evaluation recommendations; sepsis physiology was improving but patient continues to still spike Temperatures and becomes intermittently tachycardic and tachypenic  -Patient's WBC minimally improved and went from 20.2 -> 19.7 -> 21.1 -> 28.9 -> 23.8 -> 20.4 -> 18.8 -Now infectious diseases recommending referral to cardiothoracic surgery and continuing gentamicin and vancomycin and adding rifampin later once  blood cultures remain negative; cardiothoracic surgery feels that he is not a candidate for redoing his surgical intervention -Repeat Blood Cx's from 12/26/20 POSITIVE AGAIN but the ones drawn a few days ago showed no growth to date -Infectious diseases to repeat blood cultures on 12/27/2020 showing NGTD <24 Hours so far  Plan: --cont IV vanc and gentamicin, per ID  Acute CVAs recurrent cardioembolic strokes  in the setting of septic emboli from infective endocarditis -MRI of the brain overnight done and showed "Numerous punctate foci of abnormal diffusion restriction within both hemispheres in multiple vascular territories, consistent with acute infarcts.  Multiple scattered microhemorrhages in a nonspecific pattern." -Neurology was consulted and patient already had most of the stroke work-up done in the last few admissions and they did not recommend repeating echocardiogram as he had a TEE 520 confirming infective endocarditis -Neurology recommended holding antiplatelets at the time given risk of hemorrhagic conversion -DVT prophylaxis changed from heparin to SCDs and recommending resuming in 1 week -Neurology recommended continue telemetry monitoring and a 30-day event monitor on discharge if no arrhythmias were captured  Elevated troponin 2/2 demand ischemia -Troponin was elevated and went to 774 and likely in the setting of demand ischemia in the setting of his acute illness with bacteremia and tachyarrhythmias; he did have normal coronary arteries in 10/2020  Arrhythmia/new onset SVT -Patient was found to be in SVT this morning and tried vagal maneuvers without success He was given a beta-blocker early this morning as well with out any improvement.  He is also given 250 normal saline bolus -Cardiology was consulted and troponins were obtained and his troponin level was 613 and trended up to 774 -Cardiology evaluated and gave the patient 6 mg of adenosine which slowed his rhythm and it looked  like a 2-1 a flutter block so they started him on diltiazem drip -Once his arrhythmia improved and his heart rate slowed down cardiology reviewed his telemetry and they felt that it was not a flutter and may just represent sinus tachycardia so the will transition to oral diltiazem. -Cardiology following appreciate evaluation and Dr. Gardiner Rhyme evaluated today and patient is currently in sinus tachycardia with rates in the 100 and has been switched to p.o. diltiazem 30 mg 4 times daily and now further consolidated to diltiazem 140 mg p.o. daily -It is unclear if he did have atrial flutter but he is not a good candidate for anticoagulation given his multiple cardioembolic events from his infective endocarditis. --cont cardizem  Hypotension, resolved -In the setting of MRSA bacteremia his admission blood pressures were as low as 84/51 but have now improved -Improved with IV fluid resuscitation -Continue to monitor blood pressures per protocol; blood pressures are still on the softer side but last blood pressure reading was 160/110 -His home blood pressure medications include clonidine 0.2 mg p.o. twice daily as well as metoprolol tartrate 50 mg p.o. twice daily which have both been held -Goal MAP bolus of at least 65 --encourage oral hydration  Acute metabolic encephalopathy  in the setting of infection, resolved -He is noted to be alert and oriented x2 but was previously alert and oriented x4 -CT scan did not show any acute abnormalities; he does have a prior history of mitral valve endocarditis with septic emboli to the brain and to the lung  ESRD on Hemodialysis Monday Wednesday  Secondary hyperparathyroidism -Patient has a history of poorly cystic kidney disease -iHD per nephrology  Hyponatremia, mild --stable  Acute Respiratory Failure with Hypoxia, resolved -Was placed on 2 L supplemental oxygen via nasal cannula and does not wear supplemental oxygen at home -Chest x-ray yesterday showed  "Lower lung volumes with atelectasis. Chronic cardiomegaly and right lung  Scarring." -Patient does have a history of MRSA bacteremia with septic emboli to the lungs --weaned down to RA  History of MRSA bacteremia with mitral valve endocarditis and PFO and septic emboli to the brain and left lung -He previously completed antibiotics on 11/25/2020 but now is back with another bacteremia as above so is now going to be on IV Vancomycin, IV Gentamicin, and IV Rifampin once Cx's clear   Anemia of chronic kidney disease --Hgb 9's --Monitor Hgb and transfuse to keep Hgb >7  Hypokalemia --monitor and replete PRN  Chronic Pain Syndrome with Chronic Opiate Dependence -He reports chronic back pain as well as right lower extremity that was recently amputated --cont oxycontin 15 mg q12h scheduled  Status post right AKA -Recently had an AKA done in the last 2 months -Continue with Pregabalin 75 mg p.o. nightly  Liver cirrhosis secondary to chronic hepatitis C -His HCC quantitative was 1,750,000 on 09/29/2020 -Infectious diseases been consulted given his MRSA bacteremia again and appreciate further evaluation recommendations.  GERD -cont PPI  Hyperbilirubinemia, mild --monitor   DVT prophylaxis: SCD Code Status: FULL CODE  Family Communication:  Disposition Plan: Pending further clinical work-up and evaluation by infectious diseases as well as nephrology, Neurology, cardiology and TCTS  Status is: Inpatient  Remains inpatient appropriate because:Unsafe d/c plan, IV treatments appropriate due to intensity of illness or inability to take PO and Inpatient level of care appropriate due to severity of illness   Dispo: The patient is from: Home              Anticipated d/c is to: Home              Patient currently is not medically stable to d/c.  Bacteremia not cleared.    Difficult to place patient No  Consultants:   Infectious Diseases  Nephrology  Cardiology    Neurology  Cardiothoracic Surgery  Palliative Care   Procedures:  TEE done by Dr. Debara Pickett  Findings:  1. LEFT VENTRICLE: The left ventricular wall thickness is moderately increased.  The left ventricular cavity is normal in size. Wall motion is normal.  LVEF is 60-65%.  2. RIGHT VENTRICLE:  The right ventricle is severely dilated without any thrombus or masses.    3. LEFT ATRIUM:  The left atrium is normal in size without any thrombus or masses.  There is not spontaneous echo contrast ("smoke") in the left atrium consistent with a low flow state.  4. LEFT ATRIAL APPENDAGE:  The large left atrial appendage is free of any thrombus or masses. The appendage has single lobes. Pulse doppler indicates high flow in the appendage.  5. ATRIAL SEPTUM:  The atrial septum appears intact and is free of thrombus and/or masses.  There is no evidence for interatrial shunting by color doppler and saline microbubble.  6. RIGHT ATRIUM:  The right atrium is severely dilated in size and function without any thrombus or masses.  7. 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 There is  trivial regurgitation.  There is a possible small mobile vegetation adherent to the posterior leaflet which appears filamentous, this could also be residual valve tissue or suture.   8. AORTIC VALVE:  The aortic valve is trileaflet, normal in structure and function with no regurgitation.  The valve was visualized with 2D/3D modes - there appears to be a small mobile density at the base of the left coronary cusp seen in the long-axis view on the outflow side of the leaflet, possibly consistent with vegetation.  9. TRICUSPID VALVE:  The tricuspid valve is normal in structure and function with Mild regurgitation.  There were no vegetations or stenosis  10.  PULMONIC VALVE:  The pulmonic valve is normal in structure and function with trivial regurgitation.  There were no vegetations or  stenosis.   11. AORTIC ARCH, ASCENDING AND DESCENDING AORTA:  There was grade 2 Ron Parker et. Al, 1992) atherosclerosis of the ascending aorta, aortic arch, or proximal descending aorta.  12. PULMONARY VEINS: Anomalous pulmonary venous return was not noted.  13. PERICARDIUM: The pericardium appeared normal and non-thickened.  There is no pericardial effusion.  IMPRESSION:   1. 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  2. No LAA thrombus 3. Negative for PFO 4. Moderate LVH 5. Severe RA/RV dilitation with mild TR 6. LVEF 60-65%  RECOMMENDATIONS:    1. Consider longer course of antibiotics for possible valvular vegetations, including a small, mobile filamentous structure on the posterior bioprosthetic valve leaflet which is concerning for vegetation.  Antimicrobials:  Anti-infectives (From admission, onward)   Start     Dose/Rate Route Frequency Ordered Stop   12/29/20 1400  gentamicin (GARAMYCIN) IVPB 100 mg        100 mg 200 mL/hr over 30 Minutes Intravenous Every M-W-F 12/29/20 1312     12/29/20 0932  vancomycin (VANCOREADY) 750 MG/150ML IVPB       Note to Pharmacy: Judieth Keens  : cabinet override      12/29/20 0932 12/29/20 1114   12/27/20 1800  gentamicin (GARAMYCIN) IVPB 80 mg  Status:  Discontinued        80 mg 100 mL/hr over 30 Minutes Intravenous Every M-W-F (1800) 12/24/20 1612 12/29/20 1312   12/27/20 0813  vancomycin (VANCOREADY) 750 MG/150ML IVPB  Status:  Discontinued       Note to Pharmacy: Louretta Shorten   : cabinet override      12/27/20 0813 12/27/20 0815   12/24/20 1700  gentamicin (GARAMYCIN) IVPB 120 mg        120 mg 200 mL/hr over 30 Minutes Intravenous  Once 12/24/20 1612 12/24/20 1910   12/24/20 1430  vancomycin (VANCOCIN) IVPB 750 mg/150 ml premix        750 mg 150 mL/hr over 60 Minutes Intravenous Every M-W-F (Hemodialysis) 12/24/20 1324     12/22/20 1201  vancomycin variable dose per  unstable renal function (pharmacist dosing)  Status:  Discontinued         Does not apply See admin instructions 12/22/20 1202 12/24/20 1324   12/22/20 1200  vancomycin (VANCOREADY) IVPB 750 mg/150 mL  Status:  Discontinued        750 mg 150 mL/hr over 60 Minutes Intravenous Every M-W-F (Hemodialysis) 12/22/20 0845 12/22/20 1202  12/22/20 1200  ceFEPIme (MAXIPIME) 2 g in sodium chloride 0.9 % 100 mL IVPB  Status:  Discontinued        2 g 200 mL/hr over 30 Minutes Intravenous Every M-W-F (Hemodialysis) 12/22/20 0845 12/23/20 1450   12/22/20 0715  vancomycin (VANCOREADY) IVPB 1500 mg/300 mL        1,500 mg 150 mL/hr over 120 Minutes Intravenous  Once 12/22/20 0714 12/22/20 1034   12/22/20 0715  ceFEPIme (MAXIPIME) 2 g in sodium chloride 0.9 % 100 mL IVPB        2 g 200 mL/hr over 30 Minutes Intravenous  Once 12/22/20 0714 12/22/20 0806         Subjective: Pt reported no cough.  Eating ok.  Having BM's.  Able to get out of bed to the bedside commode on his own with walker.   Objective: Vitals:   12/30/20 0420 12/30/20 0743 12/30/20 1235 12/30/20 1614  BP: (!) 141/110 (!) 148/103 (!) 162/110 (!) 148/99  Pulse: (!) 126 (!) 120 (!) 134 (!) 122  Resp: '20 16 20 17  '$ Temp: 98.7 F (37.1 C) 98.8 F (37.1 C) 99.2 F (37.3 C) 99.9 F (37.7 C)  TempSrc: Oral Oral Oral Oral  SpO2: 92% 93% 96% 96%  Weight: 69.8 kg     Height:        Intake/Output Summary (Last 24 hours) at 12/30/2020 1756 Last data filed at 12/30/2020 1355 Gross per 24 hour  Intake 660 ml  Output 0 ml  Net 660 ml   Filed Weights   12/29/20 0803 12/29/20 1220 12/30/20 0420  Weight: 71.5 kg 67.8 kg 69.8 kg   Examination: Constitutional: NAD, AAOx3 HEENT: conjunctivae and lids normal, EOMI CV: No cyanosis.   RESP: normal respiratory effort, on RA Extremities: No effusions, edema in LLE, right AKA SKIN: warm, dry Neuro: II - XII grossly intact.   Psych: Normal mood and affect.  Appropriate judgement and  reason   Data Reviewed: I have personally reviewed following labs and imaging studies  CBC: Recent Labs  Lab 12/25/20 0056 12/26/20 0117 12/27/20 0218 12/28/20 0222 12/29/20 0035 12/30/20 0158  WBC 28.9* 23.8* 20.4* 18.8* 20.6* 19.4*  NEUTROABS 23.7* 19.2* 15.3* 14.1* 17.1*  --   HGB 10.0* 9.7* 9.2* 9.6* 9.5* 9.2*  HCT 32.8* 32.3* 30.7* 32.4* 31.0* 30.8*  MCV 80.6 79.2* 78.9* 80.0 79.3* 79.6*  PLT 262 315 330 337 317 [id number]   Basic Metabolic Panel: Recent Labs  Lab 12/25/20 0056 12/26/20 0117 12/27/20 0218 12/28/20 0222 12/29/20 0035 12/30/20 0158  NA 137 133* 132* 134* 133*  133* 131*  K 3.3* 3.4* 3.3* 3.6 4.1  4.0 4.0  CL 98 95* 95* 94* 94*  94* 94*  CO2 '24 26 22 27 26  25 25  '$ GLUCOSE 90 107* 96 95 93  92 83  BUN 26* 37* 48* 25* 35*  34* 22*  CREATININE 4.78* 5.95* 7.45* 4.94* 6.51*  6.40* 4.37*  CALCIUM 8.7* 9.2 9.4 9.1 9.5  9.6 8.7*  MG 2.0 2.2 2.3 2.1 2.2 2.0  PHOS 1.5* 2.4* 3.0 2.3* 3.7  3.7  --    GFR: Estimated Creatinine Clearance: 20.2 mL/min (A) (by C-G formula based on SCr of 4.37 mg/dL (H)). Liver Function Tests: Recent Labs  Lab 12/25/20 0056 12/26/20 0117 12/27/20 0218 12/28/20 0222 12/29/20 0035  AST '22 25 22 25 28  '$ ALT '10 9 10 10 11  '$ ALKPHOS 81 76 71 87 74  BILITOT 1.2 1.1 1.5* 0.8  1.9*  PROT 8.5* 8.4* 8.7* 8.3* 8.2*  ALBUMIN 2.2* 2.2* 2.1* 2.2* 2.2*  2.1*   No results for input(s): LIPASE, AMYLASE in the last 168 hours. No results for input(s): AMMONIA in the last 168 hours. Coagulation Profile: No results for input(s): INR, PROTIME in the last 168 hours. Cardiac Enzymes: No results for input(s): CKTOTAL, CKMB, CKMBINDEX, TROPONINI in the last 168 hours. BNP (last 3 results) No results for input(s): PROBNP in the last 8760 hours. HbA1C: No results for input(s): HGBA1C in the last 72 hours. CBG: No results for input(s): GLUCAP in the last 168 hours. Lipid Profile: No results for input(s): CHOL, HDL, LDLCALC, TRIG, CHOLHDL,  LDLDIRECT in the last 72 hours. Thyroid Function Tests: No results for input(s): TSH, T4TOTAL, FREET4, T3FREE, THYROIDAB in the last 72 hours. Anemia Panel: No results for input(s): VITAMINB12, FOLATE, FERRITIN, TIBC, IRON, RETICCTPCT in the last 72 hours. Sepsis Labs: Recent Labs  Lab 12/26/20 1019 12/27/20 0218 12/28/20 0222  PROCALCITON 28.50 28.84 23.98    Recent Results (from the past 240 hour(s))  Resp Panel by RT-PCR (Flu A&B, Covid) Nasopharyngeal Swab     Status: None   Collection Time: 12/22/20  7:14 AM   Specimen: Nasopharyngeal Swab; Nasopharyngeal(NP) swabs in vial transport medium  Result Value Ref Range Status   SARS Coronavirus 2 by RT PCR NEGATIVE NEGATIVE Final    Comment: (NOTE) SARS-CoV-2 target nucleic acids are NOT DETECTED.  The SARS-CoV-2 RNA is generally detectable in upper respiratory specimens during the acute phase of infection. The lowest concentration of SARS-CoV-2 viral copies this assay can detect is 138 copies/mL. A negative result does not preclude SARS-Cov-2 infection and should not be used as the sole basis for treatment or other patient management decisions. A negative result may occur with  improper specimen collection/handling, submission of specimen other than nasopharyngeal swab, presence of viral mutation(s) within the areas targeted by this assay, and inadequate number of viral copies(<138 copies/mL). A negative result must be combined with clinical observations, patient history, and epidemiological information. The expected result is Negative.  Fact Sheet for Patients:  EntrepreneurPulse.com.au  Fact Sheet for Healthcare Providers:  IncredibleEmployment.be  This test is no t yet approved or cleared by the [place] FDA and  has been authorized for detection and/or diagnosis of SARS-CoV-2 by FDA under an Emergency Use Authorization (EUA). This EUA will remain  in effect (meaning this test  can be used) for the duration of the COVID-19 declaration under Section 564(b)(1) of the Act, 21 U.S.C.section 360bbb-3(b)(1), unless the authorization is terminated  or revoked sooner.       Influenza A by PCR NEGATIVE NEGATIVE Final   Influenza B by PCR NEGATIVE NEGATIVE Final    Comment: (NOTE) The Xpert Xpress SARS-CoV-2/FLU/RSV plus assay is intended as an aid in the diagnosis of influenza from Nasopharyngeal swab specimens and should not be used as a sole basis for treatment. Nasal washings and aspirates are unacceptable for Xpert Xpress SARS-CoV-2/FLU/RSV testing.  Fact Sheet for Patients: EntrepreneurPulse.com.au  Fact Sheet for Healthcare Providers: IncredibleEmployment.be  This test is not yet approved or cleared by the [place] FDA and has been authorized for detection and/or diagnosis of SARS-CoV-2 by FDA under an Emergency Use Authorization (EUA). This EUA will remain in effect (meaning this test can be used) for the duration of the COVID-19 declaration under Section 564(b)(1) of the Act, 21 U.S.C. section 360bbb-3(b)(1), unless the authorization is terminated or revoked.  Performed at [facility], [address], [place], [place] [address]   Blood Culture (routine x 2)     Status: Abnormal   Collection Time: 12/22/20  7:14 AM   Specimen: BLOOD  Result Value Ref Range Status   Specimen Description BLOOD SITE NOT SPECIFIED  Final   Special Requests   Final    BOTTLES DRAWN AEROBIC AND ANAEROBIC Blood Culture adequate volume   Culture  Setup Time   Final    GRAM POSITIVE COCCI IN CLUSTERS IN BOTH AEROBIC AND ANAEROBIC BOTTLES CRITICAL RESULT CALLED TO, READ BACK BY AND VERIFIED WITHDion Body [facility] 2028 12/22/20 A BROWNING Performed at [facility], [place] [address] [place], [place] [address]    Culture METHICILLIN RESISTANT STAPHYLOCOCCUS AUREUS (A)  Final   Report Status 12/24/2020 FINAL  Final   Organism  ID, Bacteria METHICILLIN RESISTANT STAPHYLOCOCCUS AUREUS  Final      Susceptibility   Methicillin resistant staphylococcus aureus - MIC*    CIPROFLOXACIN 4 RESISTANT Resistant     ERYTHROMYCIN >=8 RESISTANT Resistant     GENTAMICIN <=0.5 SENSITIVE Sensitive     OXACILLIN >=4 RESISTANT Resistant     TETRACYCLINE <=1 SENSITIVE Sensitive     VANCOMYCIN 1 SENSITIVE Sensitive     TRIMETH/SULFA <=10 SENSITIVE Sensitive     CLINDAMYCIN <=0.25 SENSITIVE Sensitive     RIFAMPIN <=0.5 SENSITIVE Sensitive     Inducible Clindamycin NEGATIVE Sensitive     * METHICILLIN RESISTANT STAPHYLOCOCCUS AUREUS  Blood Culture ID Panel (Reflexed)     Status: Abnormal   Collection Time: 12/22/20  7:14 AM  Result Value Ref Range Status   Enterococcus faecalis NOT DETECTED NOT DETECTED Final   Enterococcus Faecium NOT DETECTED NOT DETECTED Final   Listeria monocytogenes NOT DETECTED NOT DETECTED Final   Staphylococcus species DETECTED (A) NOT DETECTED Final    Comment: CRITICAL RESULT CALLED TO, READ BACK BY AND VERIFIED WITH: J MILLEN PHARMD 2028 12/22/20 A BROWNING    Staphylococcus aureus (BCID) DETECTED (A) NOT DETECTED Final    Comment: Methicillin (oxacillin)-resistant Staphylococcus aureus (MRSA). MRSA is predictably resistant to beta-lactam antibiotics (except ceftaroline). Preferred therapy is vancomycin unless clinically contraindicated. Patient requires contact precautions if  hospitalized. CRITICAL RESULT CALLED TO, READ BACK BY AND VERIFIED WITH: Dion Body PHARMD 2028 12/22/20 A BROWNING    Staphylococcus epidermidis NOT DETECTED NOT DETECTED Final   Staphylococcus lugdunensis NOT DETECTED NOT DETECTED Final   Streptococcus species NOT DETECTED NOT DETECTED Final   Streptococcus agalactiae NOT DETECTED NOT DETECTED Final   Streptococcus pneumoniae NOT DETECTED NOT DETECTED Final   Streptococcus pyogenes NOT DETECTED NOT DETECTED Final   A.calcoaceticus-baumannii NOT DETECTED NOT DETECTED Final    Bacteroides fragilis NOT DETECTED NOT DETECTED Final   Enterobacterales NOT DETECTED NOT DETECTED Final   Enterobacter cloacae complex NOT DETECTED NOT DETECTED Final   Escherichia coli NOT DETECTED NOT DETECTED Final   Klebsiella aerogenes NOT DETECTED NOT DETECTED Final   Klebsiella oxytoca NOT DETECTED NOT DETECTED Final   Klebsiella pneumoniae NOT DETECTED NOT DETECTED Final   Proteus species NOT DETECTED NOT DETECTED Final   Salmonella species NOT DETECTED NOT DETECTED Final   Serratia marcescens NOT DETECTED NOT DETECTED Final   Haemophilus influenzae NOT DETECTED NOT DETECTED Final   Neisseria meningitidis NOT DETECTED NOT DETECTED Final   Pseudomonas aeruginosa NOT DETECTED NOT DETECTED Final   Stenotrophomonas maltophilia NOT DETECTED NOT DETECTED Final   Candida albicans NOT DETECTED NOT DETECTED Final   Candida auris  NOT DETECTED NOT DETECTED Final   Candida glabrata NOT DETECTED NOT DETECTED Final   Candida krusei NOT DETECTED NOT DETECTED Final   Candida parapsilosis NOT DETECTED NOT DETECTED Final   Candida tropicalis NOT DETECTED NOT DETECTED Final   Cryptococcus neoformans/gattii NOT DETECTED NOT DETECTED Final   Meth resistant mecA/C and MREJ DETECTED (A) NOT DETECTED Final    Comment: CRITICAL RESULT CALLED TO, READ BACK BY AND VERIFIED WITHDion Body [facility] 2028 12/22/20 A BROWNING Performed at [facility], [address], [place], [place] [address]   Blood Culture (routine x 2)     Status: Abnormal   Collection Time: 12/22/20  7:19 AM   Specimen: BLOOD  Result Value Ref Range Status   Specimen Description BLOOD SITE NOT SPECIFIED  Final   Special Requests   Final    BOTTLES DRAWN AEROBIC AND ANAEROBIC Blood Culture results may not be optimal due to an excessive volume of blood received in culture bottles   Culture  Setup Time   Final    GRAM POSITIVE COCCI IN CLUSTERS IN BOTH AEROBIC AND ANAEROBIC BOTTLES IDENTIFICATION TO FOLLOW CRITICAL RESULT CALLED  TO, READ BACK BY AND VERIFIED WITH: J MILLEN PHARMD 2028 12/22/20 A BROWNING    Culture (A)  Final    STAPHYLOCOCCUS AUREUS SUSCEPTIBILITIES PERFORMED ON PREVIOUS CULTURE WITHIN THE LAST 5 DAYS. Performed at [facility], [place] [address] [place], [place] [address]    Report Status 12/24/2020 FINAL  Final  Culture, blood (routine x 2)     Status: None   Collection Time: 12/24/20  3:10 PM   Specimen: BLOOD RIGHT HAND  Result Value Ref Range Status   Specimen Description BLOOD RIGHT HAND  Final   Special Requests   Final    BOTTLES DRAWN AEROBIC ONLY Blood Culture adequate volume   Culture   Final    NO GROWTH 5 DAYS Performed at [facility], [place] [address], [place], [place] [address]    Report Status 12/29/2020 FINAL  Final  Culture, blood (routine x 2)     Status: None   Collection Time: 12/24/20  3:37 PM   Specimen: BLOOD  Result Value Ref Range Status   Specimen Description BLOOD BLOOD RIGHT FOREARM  Final   Special Requests   Final    BOTTLES DRAWN AEROBIC ONLY Blood Culture adequate volume   Culture   Final    NO GROWTH 5 DAYS Performed at [facility], [place] [address], [place], [place] [address]    Report Status 12/29/2020 FINAL  Final  MRSA PCR Screening     Status: None   Collection Time: 12/25/20 10:18 PM   Specimen: Nasal Mucosa; Nasopharyngeal  Result Value Ref Range Status   MRSA by PCR NEGATIVE NEGATIVE Final    Comment:        The GeneXpert MRSA Assay (FDA approved for NASAL specimens only), is one component of a comprehensive MRSA colonization surveillance program. It is not intended to diagnose MRSA infection nor to guide or monitor treatment for MRSA infections. Performed at [facility], [place] [address], [place], [place] [address]   Culture, blood (routine x 2)     Status: Abnormal   Collection Time: 12/26/20 10:19 AM   Specimen: BLOOD RIGHT HAND  Result Value Ref Range Status   Specimen Description BLOOD RIGHT HAND  Final    Special Requests   Final    BOTTLES DRAWN AEROBIC ONLY Blood  Culture results may not be optimal due to an inadequate volume of blood received in culture bottles   Culture  Setup Time   Final    GRAM POSITIVE COCCI IN CLUSTERS AEROBIC BOTTLE ONLY CRITICAL RESULT CALLED TO, READ BACK BY AND VERIFIED WITH: PHARMD TM TUCKER [id number] AT 44 BY CM    Culture (A)  Final    STAPHYLOCOCCUS AUREUS SUSCEPTIBILITIES PERFORMED ON PREVIOUS CULTURE WITHIN THE LAST 5 DAYS. Performed at [facility], [place] [address], [place], [place] [address]    Report Status 12/29/2020 FINAL  Final  Culture, blood (routine x 2)     Status: Abnormal (Preliminary result)   Collection Time: 12/26/20 10:19 AM   Specimen: BLOOD RIGHT WRIST  Result Value Ref Range Status   Specimen Description BLOOD RIGHT WRIST  Final   Special Requests   Final    BOTTLES DRAWN AEROBIC ONLY Blood Culture results may not be optimal due to an inadequate volume of blood received in culture bottles   Culture  Setup Time   Final    GRAM POSITIVE COCCI IN CLUSTERS AEROBIC BOTTLE ONLY CRITICAL RESULT CALLED TO, READ BACK BY AND VERIFIED WITH: PHARMD M TUCKER [id number] AT 28 BY CM    Culture (A)  Final    METHICILLIN RESISTANT STAPHYLOCOCCUS AUREUS CULTURE REINCUBATED FOR BETTER GROWTH Performed at [facility], [place] [address], [place], [place] [address]    Report Status PENDING  Incomplete   Organism ID, Bacteria METHICILLIN RESISTANT STAPHYLOCOCCUS AUREUS  Final      Susceptibility   Methicillin resistant staphylococcus aureus - MIC*    CIPROFLOXACIN >=8 RESISTANT Resistant     ERYTHROMYCIN >=8 RESISTANT Resistant     GENTAMICIN <=0.5 SENSITIVE Sensitive     OXACILLIN >=4 RESISTANT Resistant     TETRACYCLINE <=1 SENSITIVE Sensitive     VANCOMYCIN 1 SENSITIVE Sensitive     TRIMETH/SULFA <=10 SENSITIVE Sensitive     CLINDAMYCIN <=0.25 SENSITIVE Sensitive     RIFAMPIN <=0.5 SENSITIVE Sensitive     Inducible Clindamycin NEGATIVE  Sensitive     * METHICILLIN RESISTANT STAPHYLOCOCCUS AUREUS  Culture, blood (routine x 2)     Status: None (Preliminary result)   Collection Time: 12/27/20  2:17 PM   Specimen: BLOOD  Result Value Ref Range Status   Specimen Description BLOOD THUMB  Final   Special Requests   Final    BOTTLES DRAWN AEROBIC AND ANAEROBIC Blood Culture adequate volume   Culture   Final    NO GROWTH 3 DAYS Performed at [facility], [address] [place], [place] [address]    Report Status PENDING  Incomplete  Culture, blood (routine x 2)     Status: Abnormal (Preliminary result)   Collection Time: 12/27/20  2:17 PM   Specimen: BLOOD RIGHT HAND  Result Value Ref Range Status   Specimen Description BLOOD RIGHT HAND  Final   Special Requests   Final    BOTTLES DRAWN AEROBIC AND ANAEROBIC Blood Culture adequate volume   Culture  Setup Time   Final    GRAM POSITIVE COCCI IN CLUSTERS ANAEROBIC BOTTLE ONLY CRITICAL VALUE NOTED.  VALUE IS CONSISTENT WITH PREVIOUSLY REPORTED AND CALLED VALUE.    Culture (A)  Final    STAPHYLOCOCCUS AUREUS SUSCEPTIBILITIES TO FOLLOW Performed at [facility], [place] [address], [place], [place] [address]    Report Status PENDING  Incomplete  Culture, blood (routine x 2)     Status:  None (Preliminary result)   Collection Time: 12/29/20  1:43 PM   Specimen: BLOOD RIGHT HAND  Result Value Ref Range Status   Specimen Description BLOOD RIGHT HAND  Final   Special Requests   Final    BOTTLES DRAWN AEROBIC AND ANAEROBIC Blood Culture adequate volume   Culture   Final    NO GROWTH < 24 HOURS Performed at [facility], [address] [place], [place] [address]    Report Status PENDING  Incomplete  Culture, blood (routine x 2)     Status: None (Preliminary result)   Collection Time: 12/29/20  1:43 PM   Specimen: BLOOD RIGHT HAND  Result Value Ref Range Status   Specimen Description BLOOD RIGHT HAND  Final   Special Requests   Final    BOTTLES DRAWN  AEROBIC AND ANAEROBIC Blood Culture adequate volume   Culture   Final    NO GROWTH < 24 HOURS Performed at [facility], [place] [address] [place], [place] [address]    Report Status PENDING  Incomplete    RN Pressure Injury Documentation:     Estimated body mass index is 20.87 kg/m as calculated from the following:   Height as of this encounter: 6' (1.829 m).   Weight as of this encounter: 69.8 kg.  Malnutrition Type:   Malnutrition Characteristics:   Nutrition Interventions:   Radiology Studies: No results found. Scheduled Meds: . (feeding supplement) PROSource Plus  30 mL Oral BID BM  . Chlorhexidine Gluconate Cloth  6 each Topical Q0600  . cinacalcet  60 mg Oral Q supper  . diltiazem  120 mg Oral Daily  . docusate sodium  100 mg Oral BID  . feeding supplement (NEPRO CARB STEADY)  237 mL Oral TID BM  . methocarbamol  500 mg Oral TID  . multivitamin with minerals  1 tablet Oral Daily  . oxyCODONE  15 mg Oral Q12H  . pantoprazole  40 mg Oral Daily  . pregabalin  75 mg Oral QHS  . senna-docusate  1 tablet Oral BID  . sodium chloride flush  3 mL Intravenous Q12H   Continuous Infusions: . gentamicin 100 mg (12/29/20 1401)  . vancomycin Stopped (12/27/20 1214)    LOS: 8 days   Enzo Bi, md [facility] PAG[facility] is on [place]  If 7PM-7AM, please contact night-coverage [email or web address]

## 2020-12-31 ENCOUNTER — Inpatient Hospital Stay (HOSPITAL_COMMUNITY): Payer: Medicaid Other

## 2020-12-31 DIAGNOSIS — I248 Other forms of acute ischemic heart disease: Secondary | ICD-10-CM | POA: Diagnosis not present

## 2020-12-31 DIAGNOSIS — I38 Endocarditis, valve unspecified: Secondary | ICD-10-CM | POA: Diagnosis not present

## 2020-12-31 DIAGNOSIS — N186 End stage renal disease: Secondary | ICD-10-CM | POA: Diagnosis not present

## 2020-12-31 DIAGNOSIS — T826XXA Infection and inflammatory reaction due to cardiac valve prosthesis, initial encounter: Secondary | ICD-10-CM | POA: Diagnosis not present

## 2020-12-31 DIAGNOSIS — R7881 Bacteremia: Secondary | ICD-10-CM | POA: Diagnosis not present

## 2020-12-31 DIAGNOSIS — I471 Supraventricular tachycardia: Secondary | ICD-10-CM | POA: Diagnosis not present

## 2020-12-31 DIAGNOSIS — B9561 Methicillin susceptible Staphylococcus aureus infection as the cause of diseases classified elsewhere: Secondary | ICD-10-CM | POA: Diagnosis not present

## 2020-12-31 LAB — BASIC METABOLIC PANEL
Anion gap: 15 (ref 5–15)
BUN: 38 mg/dL — ABNORMAL HIGH (ref 6–20)
CO2: 22 mmol/L (ref 22–32)
Calcium: 9.4 mg/dL (ref 8.9–10.3)
Chloride: 94 mmol/L — ABNORMAL LOW (ref 98–111)
Creatinine, Ser: 6.47 mg/dL — ABNORMAL HIGH (ref 0.61–1.24)
GFR, Estimated: 10 mL/min — ABNORMAL LOW (ref >60)
Glucose, Bld: 77 mg/dL (ref 70–99)
Potassium: 4.6 mmol/L (ref 3.5–5.1)
Sodium: 131 mmol/L — ABNORMAL LOW (ref 135–145)

## 2020-12-31 LAB — CBC
HCT: 30.4 % — ABNORMAL LOW (ref 39.0–52.0)
Hemoglobin: 9.2 g/dL — ABNORMAL LOW (ref 13.0–17.0)
MCH: 23.8 pg — ABNORMAL LOW (ref 26.0–34.0)
MCHC: 30.3 g/dL (ref 30.0–36.0)
MCV: 78.8 fL — ABNORMAL LOW (ref 80.0–100.0)
Platelets: 290 10*3/uL (ref 150–400)
RBC: 3.86 MIL/uL — ABNORMAL LOW (ref 4.22–5.81)
RDW: 21.3 % — ABNORMAL HIGH (ref 11.5–15.5)
WBC: 22.6 10*3/uL — ABNORMAL HIGH (ref 4.0–10.5)
nRBC: 0 % (ref 0.0–0.2)

## 2020-12-31 LAB — CULTURE, BLOOD (ROUTINE X 2): Special Requests: ADEQUATE

## 2020-12-31 LAB — MAGNESIUM: Magnesium: 2 mg/dL (ref 1.7–2.4)

## 2020-12-31 IMAGING — DX DG HAND 2V*R*
2 series · 2 of 2 positions shown · non-contrast
Comparison: None.

CLINICAL DATA: Pain at first digit

EXAM:
RIGHT HAND - 2 VIEW

[hand ap]
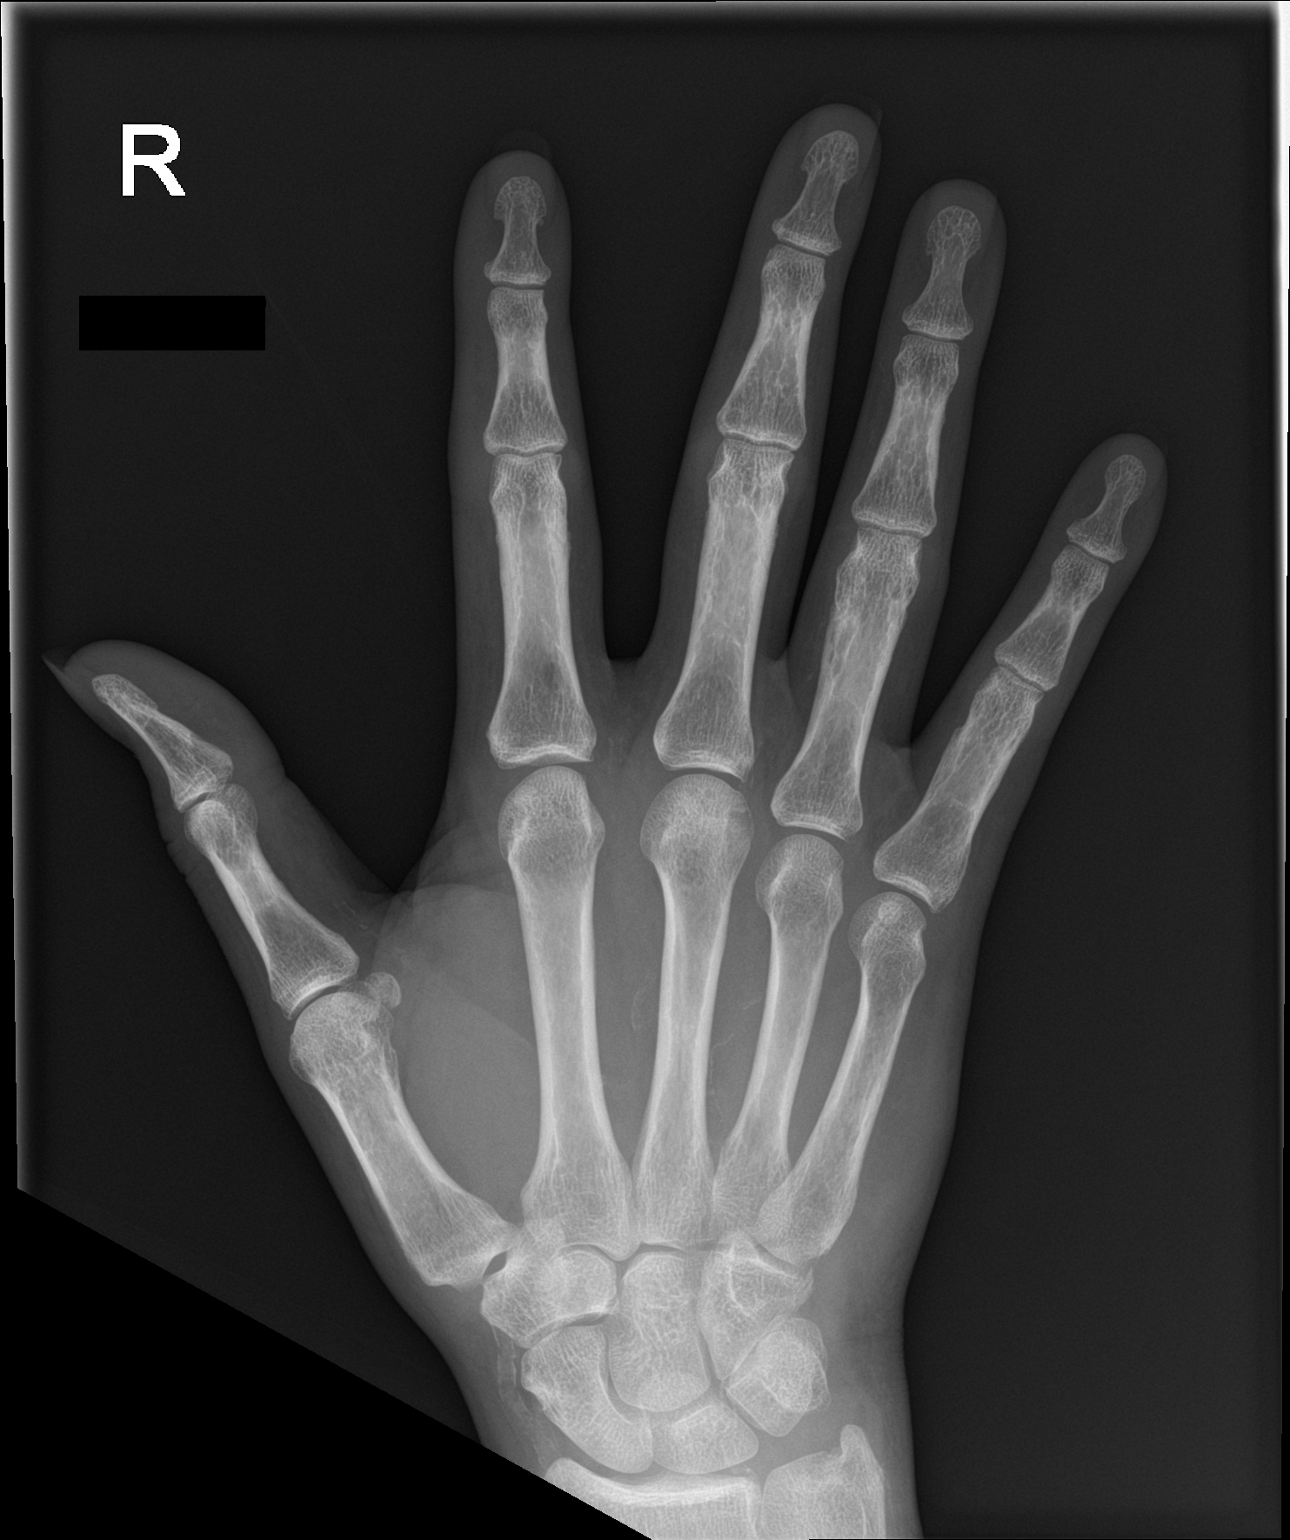

[hand lat]
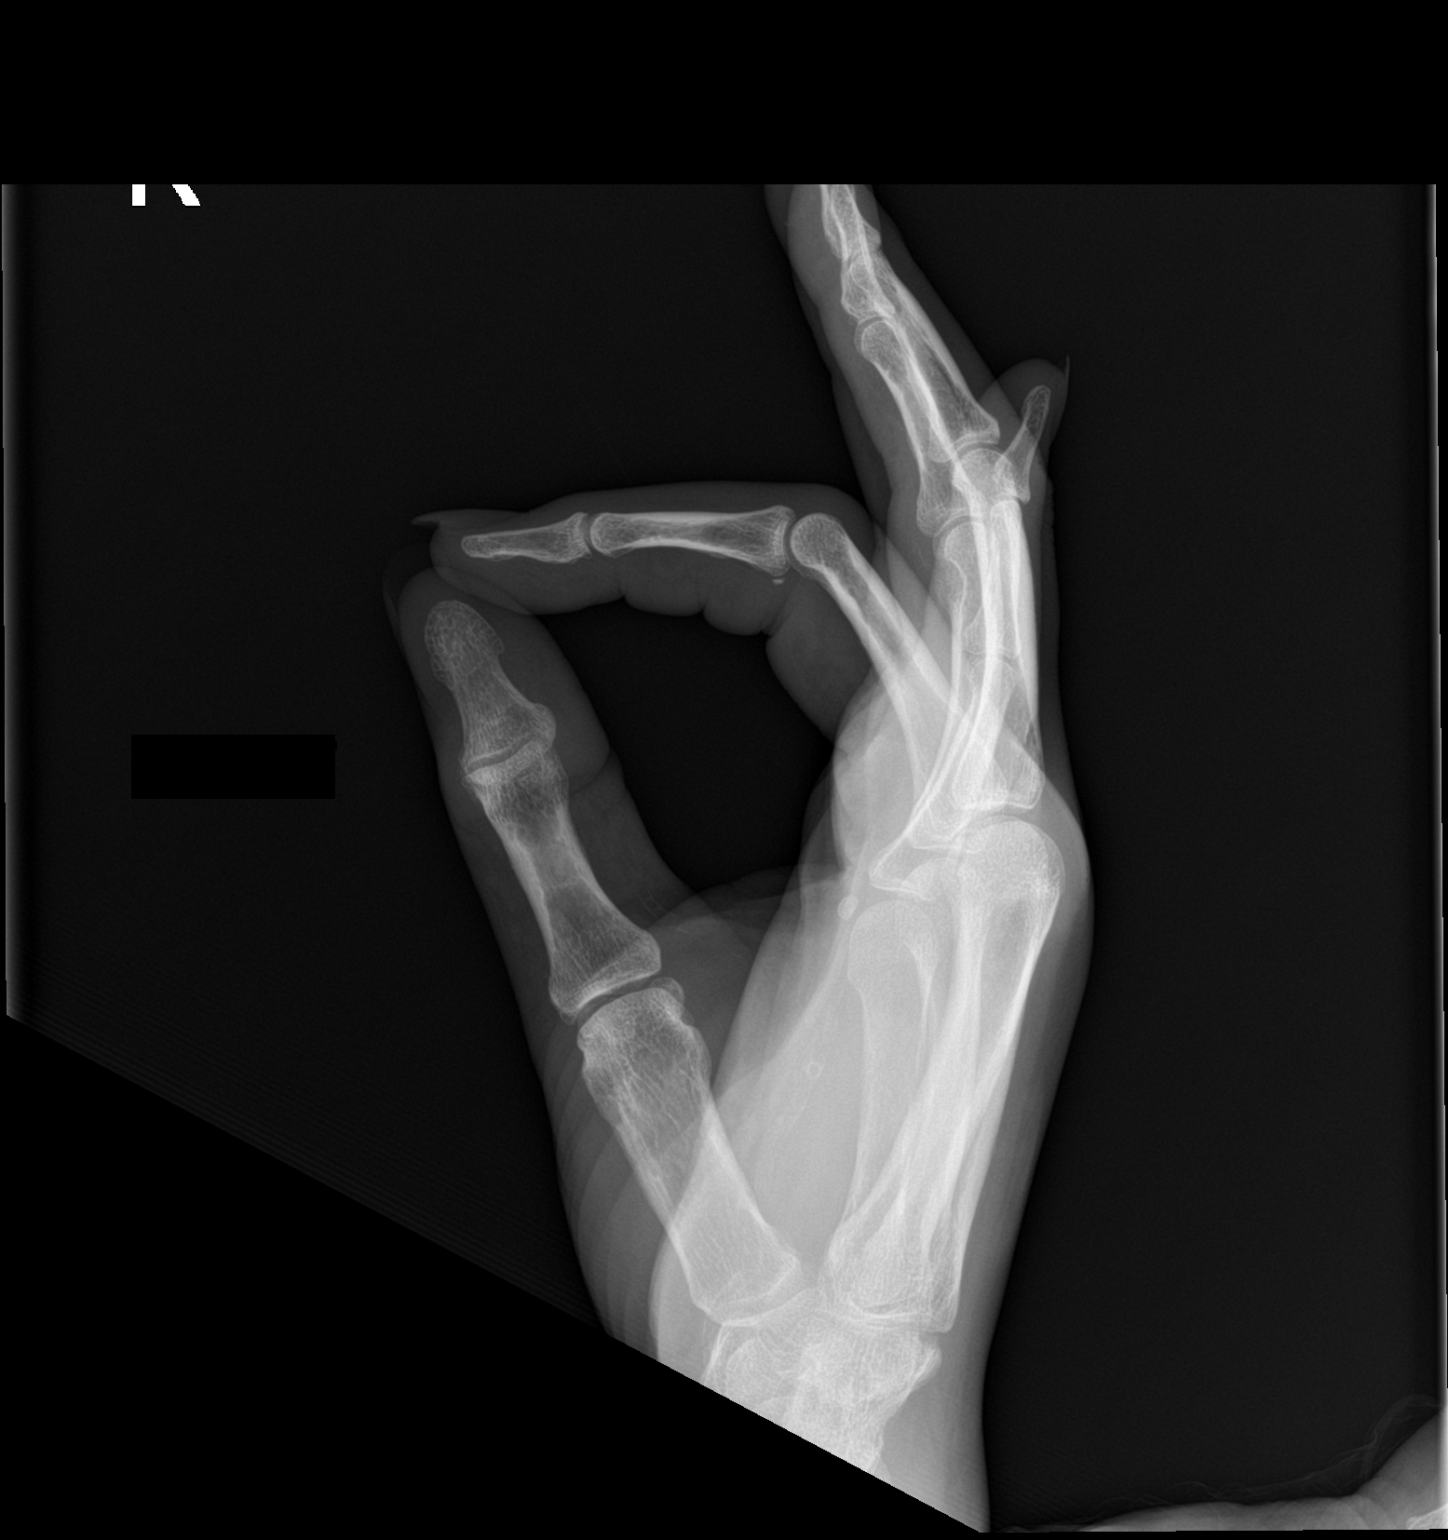

[2 of 2 positions shown; findings below may reference images not displayed]

FINDINGS: Frontal and lateral views of the right hand are obtained. No
fracture, subluxation, or dislocation. There is mild joint space
narrowing at the first metacarpophalangeal joint, first
interphalangeal joint, and throughout the remaining distal
interphalangeal joints consistent with osteoarthritis. Vascular
calcifications are seen throughout the hand and wrist. Soft tissues
are otherwise unremarkable.
IMPRESSION: 1. Mild osteoarthritis greatest in the first digit.
2. No acute bony abnormality.

## 2020-12-31 MED ORDER — VANCOMYCIN HCL 750 MG/150ML IV SOLN
INTRAVENOUS | Status: AC
  Administered 2020-12-31: 750 mg via INTRAVENOUS
  Filled 2020-12-31: qty 150

## 2020-12-31 MED ORDER — ASPIRIN EC 325 MG PO TBEC
325.0000 mg | DELAYED_RELEASE_TABLET | Freq: Every day | ORAL | Status: DC
  Administered 2021-01-01 – 2021-01-06 (×6): 325 mg via ORAL
  Filled 2020-12-31 (×7): qty 1

## 2020-12-31 MED ORDER — ENOXAPARIN SODIUM 30 MG/0.3ML IJ SOSY
30.0000 mg | PREFILLED_SYRINGE | INTRAMUSCULAR | Status: DC
  Administered 2021-01-01 – 2021-01-04 (×3): 30 mg via SUBCUTANEOUS
  Filled 2020-12-31 (×5): qty 0.3

## 2020-12-31 MED ORDER — METOPROLOL TARTRATE 50 MG PO TABS
50.0000 mg | ORAL_TABLET | Freq: Two times a day (BID) | ORAL | Status: DC
  Administered 2020-12-31 – 2021-01-06 (×12): 50 mg via ORAL
  Filled 2020-12-31 (×14): qty 1

## 2020-12-31 NOTE — Progress Notes (Signed)
[facility] Infectious Diseases Follow Up Note  Patient Identification: Patient Name: Patrick Anthony MRN: [id number] [place] Date: 12/22/2020  6:57 AM Age: 50 y.o.Today's Date: 12/31/2020   Reason for Visit: Prosthetic valve endocarditis and MRSA bacteremia  Principal Problem:   Bacteremia due to Staphylococcus aureus Active Problems:   Anemia in chronic kidney disease   Hepatitis C   ESRD on dialysis ([place] [facility])   S/P mitral valve replacement   S/P AKA (above knee amputation), right (HCC)   Personal history of Methicillin resistant Staphylococcus aureus infection   SIRS (systemic inflammatory response syndrome) (HCC)   Antibiotics:Vancomycin 5/18-current Gentamicin 5/20-current   Lines/Tubes:Left upper arm AV graft,PIV's  Interval Events: Fever has resolved, leukocytosis went up from 19-22.  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  However repeat blood cultures on 522, 5/23 and 5/25 positive for MRSA Not a surgical candidate per T CTS  T10/T11 discitis and osteomyelitis ESRD on hemodialysis via left arm AV graft-denies any issues with the AV graft Hepatitis C-to be treated as outpatient  Recommendations Continue vancomycin and gentamicin pharmacy to dose.  Pharmacy to monitor levels for therapeutic dosing.  Repeat 2 sets of blood cultures today He seems to be clearing his blood cultures given less number of positive culture bottles and time to positivity however , will consider dual therapy with addition of ceftaroline if blood cultures are persistently positive  We will follow-up susceptibility of staph aureus to Dapto linezolid and ceftaroline Hold on adding rifampin until blood cultures  stabilizes  Fu Xray rt hand - rt pointer finger is painful. He told me he hit his finger in the bed railing and feels as is there is a splinter inside.   Monitor CBC and CMP on IV antibiotics  Dr. Baxter Flattery will check on him over the long weekend.  Otherwise I will follow-up on Tuesday.   Rest of the management as per the primary team. Thank you for the consult. Please page with pertinent questions or concerns.  ______________________________________________________________________ Subjective patient seen and examined at the bedside.  Sitting up in bed.  Denies any fevers chills and sweats.  Denies nausea vomiting and diarrhea.  Denies chest pain shortness of breath and cough.  Back pain is stable.  His right pointing finger is under the splint   Vitals BP (!) 152/108 (BP Location: Right Wrist)   Pulse (!) 108   Temp 99.5 F (37.5 C) (Axillary)   Resp 17   Ht 6' (1.829 m)   Wt 70.2 kg   SpO2 92%   BMI 20.99 kg/m     Physical Exam Constitutional: Not in acute distress, comfortable  Comments:   Cardiovascular:  Rate and Rhythm: Normal rateand regular rhythm.  Heart sounds: Systolic murmur + Pulmonary:  Effort: Pulmonary effort is normal.  Comments: On nasal cannula   Abdominal:  Palpations: Abdomen is soft.  Tenderness: Non distended and non tender   Musculoskeletal:  General: No swellingor tenderness. No peripheral joint swelling and tenderness. Minimal lower back tenderness  Skin: Comments: No lesions or rashes   Neurological:  General: No focal deficitpresent.   Psychiatric:  Mood and Affect: Moodnormal.  Pertinent Microbiology Results for orders placed or performed during the hospital encounter of 12/22/20  Resp Panel by RT-PCR (Flu A&B, Covid) Nasopharyngeal Swab     Status: None  Collection Time: 12/22/20  7:14 AM   Specimen: Nasopharyngeal Swab; Nasopharyngeal(NP) swabs in vial transport medium  Result  Value Ref Range Status   SARS Coronavirus 2 by RT PCR NEGATIVE NEGATIVE Final    Comment: (NOTE) SARS-CoV-2 target nucleic acids are NOT DETECTED.  The SARS-CoV-2 RNA is generally detectable in upper respiratory specimens during the acute phase of infection. The lowest concentration of SARS-CoV-2 viral copies this assay can detect is 138 copies/mL. A negative result does not preclude SARS-Cov-2 infection and should not be used as the sole basis for treatment or other patient management decisions. A negative result may occur with  improper specimen collection/handling, submission of specimen other than nasopharyngeal swab, presence of viral mutation(s) within the areas targeted by this assay, and inadequate number of viral copies(<138 copies/mL). A negative result must be combined with clinical observations, patient history, and epidemiological information. The expected result is Negative.  Fact Sheet for Patients:  EntrepreneurPulse.com.au  Fact Sheet for Healthcare Providers:  IncredibleEmployment.be  This test is no t yet approved or cleared by the [place] FDA and  has been authorized for detection and/or diagnosis of SARS-CoV-2 by FDA under an Emergency Use Authorization (EUA). This EUA will remain  in effect (meaning this test can be used) for the duration of the COVID-19 declaration under Section 564(b)(1) of the Act, 21 U.S.C.section 360bbb-3(b)(1), unless the authorization is terminated  or revoked sooner.       Influenza A by PCR NEGATIVE NEGATIVE Final   Influenza B by PCR NEGATIVE NEGATIVE Final    Comment: (NOTE) The Xpert Xpress SARS-CoV-2/FLU/RSV plus assay is intended as an aid in the diagnosis of influenza from Nasopharyngeal swab specimens and should not be used as a sole basis for treatment. Nasal washings and aspirates are unacceptable for Xpert Xpress SARS-CoV-2/FLU/RSV testing.  Fact Sheet for  Patients: EntrepreneurPulse.com.au  Fact Sheet for Healthcare Providers: IncredibleEmployment.be  This test is not yet approved or cleared by the [place] FDA and has been authorized for detection and/or diagnosis of SARS-CoV-2 by FDA under an Emergency Use Authorization (EUA). This EUA will remain in effect (meaning this test can be used) for the duration of the COVID-19 declaration under Section 564(b)(1) of the Act, 21 U.S.C. section 360bbb-3(b)(1), unless the authorization is terminated or revoked.  Performed at [facility], [place] [address] [place], [place] [address]   Blood Culture (routine x 2)     Status: Abnormal   Collection Time: 12/22/20  7:14 AM   Specimen: BLOOD  Result Value Ref Range Status   Specimen Description BLOOD SITE NOT SPECIFIED  Final   Special Requests   Final    BOTTLES DRAWN AEROBIC AND ANAEROBIC Blood Culture adequate volume   Culture  Setup Time   Final    GRAM POSITIVE COCCI IN CLUSTERS IN BOTH AEROBIC AND ANAEROBIC BOTTLES CRITICAL RESULT CALLED TO, READ BACK BY AND VERIFIED WITHDion Body [facility] 2028 12/22/20 A BROWNING Performed at [facility], [place] [address], [place], [place] [address]    Culture METHICILLIN RESISTANT STAPHYLOCOCCUS AUREUS (A)  Final   Report Status 12/24/2020 FINAL  Final   Organism ID, Bacteria METHICILLIN RESISTANT STAPHYLOCOCCUS AUREUS  Final      Susceptibility   Methicillin resistant staphylococcus aureus - MIC*    CIPROFLOXACIN 4 RESISTANT Resistant     ERYTHROMYCIN >=8 RESISTANT Resistant     GENTAMICIN <=0.5 SENSITIVE Sensitive     OXACILLIN >=4 RESISTANT Resistant  TETRACYCLINE <=1 SENSITIVE Sensitive     VANCOMYCIN 1 SENSITIVE Sensitive     TRIMETH/SULFA <=10 SENSITIVE Sensitive     CLINDAMYCIN <=0.25 SENSITIVE Sensitive     RIFAMPIN <=0.5 SENSITIVE Sensitive     Inducible Clindamycin NEGATIVE Sensitive     * METHICILLIN RESISTANT STAPHYLOCOCCUS AUREUS   Blood Culture ID Panel (Reflexed)     Status: Abnormal   Collection Time: 12/22/20  7:14 AM  Result Value Ref Range Status   Enterococcus faecalis NOT DETECTED NOT DETECTED Final   Enterococcus Faecium NOT DETECTED NOT DETECTED Final   Listeria monocytogenes NOT DETECTED NOT DETECTED Final   Staphylococcus species DETECTED (A) NOT DETECTED Final    Comment: CRITICAL RESULT CALLED TO, READ BACK BY AND VERIFIED WITH: [facility] PHARMD 2028 12/22/20 A BROWNING    Staphylococcus aureus (BCID) DETECTED (A) NOT DETECTED Final    Comment: Methicillin (oxacillin)-resistant Staphylococcus aureus (MRSA). MRSA is predictably resistant to beta-lactam antibiotics (except ceftaroline). Preferred therapy is vancomycin unless clinically contraindicated. Patient requires contact precautions if  hospitalized. CRITICAL RESULT CALLED TO, READ BACK BY AND VERIFIED WITH: Dion Body PHARMD 2028 12/22/20 A BROWNING    Staphylococcus epidermidis NOT DETECTED NOT DETECTED Final   Staphylococcus lugdunensis NOT DETECTED NOT DETECTED Final   Streptococcus species NOT DETECTED NOT DETECTED Final   Streptococcus agalactiae NOT DETECTED NOT DETECTED Final   Streptococcus pneumoniae NOT DETECTED NOT DETECTED Final   Streptococcus pyogenes NOT DETECTED NOT DETECTED Final   A.calcoaceticus-baumannii NOT DETECTED NOT DETECTED Final   Bacteroides fragilis NOT DETECTED NOT DETECTED Final   Enterobacterales NOT DETECTED NOT DETECTED Final   Enterobacter cloacae complex NOT DETECTED NOT DETECTED Final   Escherichia coli NOT DETECTED NOT DETECTED Final   Klebsiella aerogenes NOT DETECTED NOT DETECTED Final   Klebsiella oxytoca NOT DETECTED NOT DETECTED Final   Klebsiella pneumoniae NOT DETECTED NOT DETECTED Final   Proteus species NOT DETECTED NOT DETECTED Final   Salmonella species NOT DETECTED NOT DETECTED Final   Serratia marcescens NOT DETECTED NOT DETECTED Final   Haemophilus influenzae NOT DETECTED NOT DETECTED Final    Neisseria meningitidis NOT DETECTED NOT DETECTED Final   Pseudomonas aeruginosa NOT DETECTED NOT DETECTED Final   Stenotrophomonas maltophilia NOT DETECTED NOT DETECTED Final   Candida albicans NOT DETECTED NOT DETECTED Final   Candida auris NOT DETECTED NOT DETECTED Final   Candida glabrata NOT DETECTED NOT DETECTED Final   Candida krusei NOT DETECTED NOT DETECTED Final   Candida parapsilosis NOT DETECTED NOT DETECTED Final   Candida tropicalis NOT DETECTED NOT DETECTED Final   Cryptococcus neoformans/gattii NOT DETECTED NOT DETECTED Final   Meth resistant mecA/C and MREJ DETECTED (A) NOT DETECTED Final    Comment: CRITICAL RESULT CALLED TO, READ BACK BY AND VERIFIED WITHDion Body [facility] 2028 12/22/20 A BROWNING Performed at [facility], [address], [place], [place] [address]   Blood Culture (routine x 2)     Status: Abnormal   Collection Time: 12/22/20  7:19 AM   Specimen: BLOOD  Result Value Ref Range Status   Specimen Description BLOOD SITE NOT SPECIFIED  Final   Special Requests   Final    BOTTLES DRAWN AEROBIC AND ANAEROBIC Blood Culture results may not be optimal due to an excessive volume of blood received in culture bottles   Culture  Setup Time   Final    GRAM POSITIVE COCCI IN CLUSTERS IN BOTH AEROBIC AND ANAEROBIC BOTTLES IDENTIFICATION TO FOLLOW CRITICAL RESULT CALLED TO,  READ BACK BY AND VERIFIED WITH: Dion Body PHARMD 2028 12/22/20 A BROWNING    Culture (A)  Final    STAPHYLOCOCCUS AUREUS SUSCEPTIBILITIES PERFORMED ON PREVIOUS CULTURE WITHIN THE LAST 5 DAYS. Performed at [facility], [place] [address] [place] [address], [place], [place] [address]    Report Status 12/24/2020 FINAL  Final  Culture, blood (routine x 2)     Status: None   Collection Time: 12/24/20  3:10 PM   Specimen: BLOOD RIGHT HAND  Result Value Ref Range Status   Specimen Description BLOOD RIGHT HAND  Final   Special Requests   Final    BOTTLES DRAWN AEROBIC ONLY Blood Culture adequate volume    Culture   Final    NO GROWTH 5 DAYS Performed at [facility], [place] [address], [place], [place] [address]    Report Status 12/29/2020 FINAL  Final  Culture, blood (routine x 2)     Status: None   Collection Time: 12/24/20  3:37 PM   Specimen: BLOOD  Result Value Ref Range Status   Specimen Description BLOOD BLOOD RIGHT FOREARM  Final   Special Requests   Final    BOTTLES DRAWN AEROBIC ONLY Blood Culture adequate volume   Culture   Final    NO GROWTH 5 DAYS Performed at [facility], [place] [address] [place], [place] [address]    Report Status 12/29/2020 FINAL  Final  MRSA PCR Screening     Status: None   Collection Time: 12/25/20 10:18 PM   Specimen: Nasal Mucosa; Nasopharyngeal  Result Value Ref Range Status   MRSA by PCR NEGATIVE NEGATIVE Final    Comment:        The GeneXpert MRSA Assay (FDA approved for NASAL specimens only), is one component of a comprehensive MRSA colonization surveillance program. It is not intended to diagnose MRSA infection nor to guide or monitor treatment for MRSA infections. Performed at [facility], [place] [address], [place], [place] [address]   Culture, blood (routine x 2)     Status: Abnormal   Collection Time: 12/26/20 10:19 AM   Specimen: BLOOD RIGHT HAND  Result Value Ref Range Status   Specimen Description BLOOD RIGHT HAND  Final   Special Requests   Final    BOTTLES DRAWN AEROBIC ONLY Blood Culture results may not be optimal due to an inadequate volume of blood received in culture bottles   Culture  Setup Time   Final    GRAM POSITIVE COCCI IN CLUSTERS AEROBIC BOTTLE ONLY CRITICAL RESULT CALLED TO, READ BACK BY AND VERIFIED WITH: PHARMD TM TUCKER [id number] AT 48 BY CM    Culture (A)  Final    STAPHYLOCOCCUS AUREUS SUSCEPTIBILITIES PERFORMED ON PREVIOUS CULTURE WITHIN THE LAST 5 DAYS. Performed at [facility], [place] [address] [place], [place] [address]    Report Status 12/29/2020 FINAL  Final  Culture, blood  (routine x 2)     Status: Abnormal (Preliminary result)   Collection Time: 12/26/20 10:19 AM   Specimen: BLOOD RIGHT WRIST  Result Value Ref Range Status   Specimen Description BLOOD RIGHT WRIST  Final   Special Requests   Final    BOTTLES DRAWN AEROBIC ONLY Blood Culture results may not be optimal due to an inadequate volume of blood received in culture bottles   Culture  Setup Time   Final    GRAM POSITIVE COCCI IN CLUSTERS AEROBIC BOTTLE ONLY CRITICAL RESULT CALLED TO, READ BACK BY  AND VERIFIED WITH: PHARMD Kittie Plater [id number] AT 717 BY CM    Culture (A)  Final    METHICILLIN RESISTANT STAPHYLOCOCCUS AUREUS CULTURE REINCUBATED FOR BETTER GROWTH Performed at [facility], [place] [address] [place], [place] [address]    Report Status PENDING  Incomplete   Organism ID, Bacteria METHICILLIN RESISTANT STAPHYLOCOCCUS AUREUS  Final      Susceptibility   Methicillin resistant staphylococcus aureus - MIC*    CIPROFLOXACIN >=8 RESISTANT Resistant     ERYTHROMYCIN >=8 RESISTANT Resistant     GENTAMICIN <=0.5 SENSITIVE Sensitive     OXACILLIN >=4 RESISTANT Resistant     TETRACYCLINE <=1 SENSITIVE Sensitive     VANCOMYCIN 1 SENSITIVE Sensitive     TRIMETH/SULFA <=10 SENSITIVE Sensitive     CLINDAMYCIN <=0.25 SENSITIVE Sensitive     RIFAMPIN <=0.5 SENSITIVE Sensitive     Inducible Clindamycin NEGATIVE Sensitive     * METHICILLIN RESISTANT STAPHYLOCOCCUS AUREUS  Culture, blood (routine x 2)     Status: None (Preliminary result)   Collection Time: 12/27/20  2:17 PM   Specimen: BLOOD  Result Value Ref Range Status   Specimen Description BLOOD THUMB  Final   Special Requests   Final    BOTTLES DRAWN AEROBIC AND ANAEROBIC Blood Culture adequate volume   Culture   Final    NO GROWTH 3 DAYS Performed at [facility], [address] [place], [place] [address]    Report Status PENDING  Incomplete  Culture, blood (routine x 2)     Status: Abnormal (Preliminary result)   Collection  Time: 12/27/20  2:17 PM   Specimen: BLOOD RIGHT HAND  Result Value Ref Range Status   Specimen Description BLOOD RIGHT HAND  Final   Special Requests   Final    BOTTLES DRAWN AEROBIC AND ANAEROBIC Blood Culture adequate volume   Culture  Setup Time   Final    GRAM POSITIVE COCCI IN CLUSTERS ANAEROBIC BOTTLE ONLY CRITICAL VALUE NOTED.  VALUE IS CONSISTENT WITH PREVIOUSLY REPORTED AND CALLED VALUE.    Culture (A)  Final    STAPHYLOCOCCUS AUREUS SUSCEPTIBILITIES TO FOLLOW Performed at [facility], [place] [address], [place], [place] [address]    Report Status PENDING  Incomplete  Culture, blood (routine x 2)     Status: None (Preliminary result)   Collection Time: 12/29/20  1:43 PM   Specimen: BLOOD RIGHT HAND  Result Value Ref Range Status   Specimen Description BLOOD RIGHT HAND  Final   Special Requests   Final    BOTTLES DRAWN AEROBIC AND ANAEROBIC Blood Culture adequate volume   Culture  Setup Time   Final    GRAM POSITIVE COCCI ANAEROBIC BOTTLE ONLY CRITICAL VALUE NOTED.  VALUE IS CONSISTENT WITH PREVIOUSLY REPORTED AND CALLED VALUE. Performed at [facility], [place] [address], [place], [place] [address]    Culture GRAM POSITIVE COCCI  Final   Report Status PENDING  Incomplete  Culture, blood (routine x 2)     Status: None (Preliminary result)   Collection Time: 12/29/20  1:43 PM   Specimen: BLOOD RIGHT HAND  Result Value Ref Range Status   Specimen Description BLOOD RIGHT HAND  Final   Special Requests   Final    BOTTLES DRAWN AEROBIC AND ANAEROBIC Blood Culture adequate volume   Culture   Final    NO GROWTH < 24 HOURS Performed at [facility], [place] [address], [place], [place] [address]  Report Status PENDING  Incomplete    Pertinent Lab. CBC Latest Ref Rng & Units 12/31/2020 12/30/2020 12/29/2020  WBC 4.0 - 10.5 K/uL 22.6(H) 19.4(H) 20.6(H)  Hemoglobin 13.0 - 17.0 g/dL 9.2(L) 9.2(L) 9.5(L)  Hematocrit 39.0 - 52.0 % 30.4(L) 30.8(L) 31.0(L)   Platelets 150 - 400 K/uL 290 330 317   CMP Latest Ref Rng & Units 12/31/2020 12/30/2020 12/29/2020  Glucose 70 - 99 mg/dL 77 83 93  BUN 6 - 20 mg/dL 38(H) 22(H) 35(H)  Creatinine 0.61 - 1.24 mg/dL 6.47(H) 4.37(H) 6.51(H)  Sodium 135 - 145 mmol/L 131(L) 131(L) 133(L)  Potassium 3.5 - 5.1 mmol/L 4.6 4.0 4.1  Chloride 98 - 111 mmol/L 94(L) 94(L) 94(L)  CO2 22 - 32 mmol/L '22 25 26  '$ Calcium 8.9 - 10.3 mg/dL 9.4 8.7(L) 9.5  Total Protein 6.5 - 8.1 g/dL - - 8.2(H)  Total Bilirubin 0.3 - 1.2 mg/dL - - 1.9(H)  Alkaline Phos 38 - 126 U/L - - 74  AST 15 - 41 U/L - - 28  ALT 0 - 44 U/L - - 11    Pertinent Imaging today Plain films and CT images have been personally visualized and interpreted; radiology reports have been reviewed. Decision making incorporated into the Impression / Recommendations.  I have spent more than 35 minutes for this patient encounter including review of prior medical records, coordination of care  with greater than 50% of time being face to face/counseling and discussing diagnostics/treatment plan with the patient/family.  Electronically signed by:   Rosiland Oz, MD Infectious Disease Physician [facility] Pager: [phone]

## 2020-12-31 NOTE — Progress Notes (Signed)
[facility] Progress Note   Dialysis Orders: MWF -South 4hrs, [id number], DFR500, EDW 72kg,2K/2CaProfile 2  Access:LU AVG vs BCF Heparin5500 unit qHD Mircera282mg q2wks - last 5/9 Venofer '100mg'$  qHD x5 - 1 completed  Assessment/ Plan:   1. 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  2. 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 WBCs 28 -> 20.4. On  Vancomycin and Gent per pharmacy. s/p repeat TEE with MV vegetations.  MRI thoracic spine -> concerning for osteo/discitis T10/11.  Per primary team. ID following.  3. Back pain -MRI lumbar spine with no discitis/osteomyelitis, +degenerative disc disease at L4-L5, L5-S1. ID plans for MRI T spine. 4. Hypoxia-resolved but req O2 [place] at night.  5. ESRD- On HD MWF. Continue on schedule.   HD 5/23 net UF 3.5L, 5/25 3L  Next HD today.   6. Hypertension/volume-BP elevated. Resume home meds. Under dry weight if weights correct. UF has been limited by hypotension. Tolerating UF on HD fortunately. 7. Anemiaof CKD- Hgb 9.7- Next ESA dose due 5/23. .Marland KitchenHold IV iron due to acute infection. 8. Secondary Hyperparathyroidism -Ca in goal.Phos low - binders stopped -> phos 3 5/23. Continue sensipar '60mg'$  qd. Not on VDRA. 9. Nutrition- Renal diet w/fluid restriction. Protein supplement.   Subjective:   No new events, but appears more  Sob this AM. Denies subjective fevers overnight. On review of chart TMax 99.5 and tachy.   Objective:   BP (!) 152/108 (BP Location: Right Wrist)   Pulse (!) 108   Temp 99.5 F (37.5 C) (Axillary)   Resp 17   Ht 6' (1.829 m)   Wt 70.2 kg   SpO2 92%   BMI 20.99 kg/m   Intake/Output Summary (Last 24 hours) at 12/31/2020 0720 Last data filed at 12/31/2020 0300 Gross per 24 hour  Intake 909.89 ml  Output --  Net 909.89 ml   Weight change:  -1.3 kg  Physical Exam: General: alert, oriented x3, male patient in NAD, sitting on side of  bed Heart: Tachy, regular, no mrg Lungs: clear bilaterally, occasional rhonchi  Abdomen:soft, NTND Extremities: R AKA, no edema Dialysis Access: LU BCF w/ bypass graft strong bruit  Imaging: No results found.  Labs: BMET Recent Labs  Lab 12/25/20 0056 12/26/20 0117 12/27/20 0218 12/28/20 0222 12/29/20 0035 12/30/20 0158 12/31/20 0100  NA 137 133* 132* 134* 133*  133* 131* 131*  K 3.3* 3.4* 3.3* 3.6 4.1  4.0 4.0 4.6  CL 98 95* 95* 94* 94*  94* 94* 94*  CO2 '24 26 22 27 26  25 25 22  '$ GLUCOSE 90 107* 96 95 93  92 83 77  BUN 26* 37* 48* 25* 35*  34* 22* 38*  CREATININE 4.78* 5.95* 7.45* 4.94* 6.51*  6.40* 4.37* 6.47*  CALCIUM 8.7* 9.2 9.4 9.1 9.5  9.6 8.7* 9.4  PHOS 1.5* 2.4* 3.0 2.3* 3.7  3.7  --   --    CBC Recent Labs  Lab 12/26/20 0117 12/27/20 0218 12/28/20 0222 12/29/20 0035 12/30/20 0158 12/31/20 0100  WBC 23.8* 20.4* 18.8* 20.6* 19.4* 22.6*  NEUTROABS 19.2* 15.3* 14.1* 17.1*  --   --   HGB 9.7* 9.2* 9.6* 9.5* 9.2* 9.2*  HCT 32.3* 30.7* 32.4* 31.0* 30.8* 30.4*  MCV 79.2* 78.9* 80.0 79.3* 79.6* 78.8*  PLT 315 330 337 317 330 290    Medications:    .Marland Kitchen(  feeding supplement) PROSource Plus  30 mL Oral BID BM  . Chlorhexidine Gluconate Cloth  6 each Topical Q0600  . cinacalcet  60 mg Oral Q supper  . diltiazem  120 mg Oral Daily  . docusate sodium  100 mg Oral BID  . feeding supplement (NEPRO CARB STEADY)  237 mL Oral TID BM  . methocarbamol  500 mg Oral TID  . multivitamin with minerals  1 tablet Oral Daily  . oxyCODONE  15 mg Oral Q12H  . pantoprazole  40 mg Oral Daily  . pregabalin  75 mg Oral QHS  . senna-docusate  1 tablet Oral BID  . sodium chloride flush  3 mL Intravenous Q12H      Otelia Santee, MD 12/31/2020, 7:20 AM

## 2020-12-31 NOTE — Progress Notes (Signed)
PROGRESS NOTE    Patrick Anthony  [id number] DOB: August 14, 1970 DOA: 12/22/2020 PCP: [name], MD   Brief Narrative:  The patient is a 35 atrial African-American male with a past medical history significant for but not limited to end-stage renal disease on hemodialysis on Monday Wednesday Friday, chronic diastolic CHF, hyperlipidemia, history of liver cirrhosis secondary to hepatitis C, history of alcohol abuse, OSA as well as recent AKA done by Dr. Sharol Given on 10/29/2020 and other comorbidities who presented with a chief complaint of spiking fevers for last few days and then altered mental status.  Reportedly patient was at his facility when they were noticing that he was altered and history is difficult to be obtained from the patient himself.  Of note he has had a prolonged hospitalization from 09/27/20 until November 11, 2019 he was found to have MRSA bacteremia as well as mitral valve endocarditis with PFO and septic emboli to the brain along with that time.  He underwent a bioprosthetic mitral valve replacement along with closure of the PFO on 10/14/2020.  Also during that hospitalization he was treated for osteomyelitis and abscess of the right tibia status post AKA on 10/29/2020 by Dr. Sharol Given.  He was discharged to [facility] for continuation of vancomycin at the nursing facility until 11/25/2020.  Reportedly he is usually alert and oriented x4.  Yesterday he is only alert and oriented to person and place but not the situation.  He complained about severe pain in his back has been present for some time and he describes shooting pains in the right leg but was amputated.  He denies any shortness breath or cough.  In the ED he presented with a fever of 103 and rales were reported in the lower lung field on the right lung.  Upon arrival to the ED he was noted to be septic given that he had an elevated temperature, respirations as well as being tachycardia and had a new O2 requirement as well as being altered.   CT scan of the brain showed no acute abnormalities but did show several changes that were chronic injury to the areas of infarct noted on the previous MRI.  His WBC was elevated 20,200.  Chest x-ray showed scarring in the right upper lobe, middle lobe atelectasis and a small left-sided pleural effusion stable cardiomegaly.  Initial sepsis protocol was initiated and he was given IV vancomycin and cefepime.  Further work-up reveals that he has another MRSA bacteremia and infectious diseases is involved again and nephrology is involved as well.  Patient is to get a repeat TEE and this was done today 12/24/20 to evaluate his mitral valve. Still pending to get a MRI the T-spine with and without contrast with hemodialysis after.  UnFortunately a TEE shows likely bioprosthetic valve infection so infectious disease is now going to add gentamicin and rifampin once his cultures are cleared.  We will still pursue his thoracic spine MRI which is still pending to be done.  Overnight the patient developed blurred vision and he underwent a head MRI which showed recurrent cardioembolic strokes in the setting of septic emboli from infective endocarditis.  Neurology was consulted and they did not recommend further work-up given that he recently just had an MRI of the brain with and without contrast on 09/28/2020 as well as an echo and carotid Dopplers.  Neurology recommending holding antiplatelets given the risk of hemorrhagic conversion of recurrent infective endocarditis related strokes and resume in 1 week if patient remains stable.  Patient's aspirin was then discontinued.  MRI of the spine shows early discitis-osteomyelitis at the T10-T11 level with mild perivertebral soft tissue edema at this level and no evidence of epidural fluid collection or abscess.  There is also moderate endplate marrow edema centered at C6-7 intervertebral disc space similar to previous MRI and small bilateral pleural effusion.  Cardiothoracic  surgery evaluated and they feel that he is a candidate for redo mitral valve replacement and aortic valve replacement 3 months following previous surgery as they do not feel that he would survive this.  They recommend continuing antibiotic therapy.  On the morning of 12/26/20 he went into SVT and heart rates spiked in the 150s and he was given at 250 mL bolus, tried vagal maneuvers which did not break his rhythm and he was given Tylenol for his temperature of 102.8.  Cardiology was consulted and he was given adenosine 6 mg and then it looked like 2-1 flutter and he was placed on a diltiazem drip and bolus.  Cardiology feels that he cannot be anticoagulated this time given his risk for hemorrhagic conversion.  Further review of telemetry showed that his heart rate may just represent sinus tachycardia and as there is no clear flutter waves and now cardiology transition him to oral diltiazem.  He Continues to spike temperatures and next hemodialysis session per nephrology.  Palliative care consulted and current goals of care discussion is still aggressive and full care p.o. diltiazem to 120 mg daily  Assessment & Plan:   Principal Problem:   Bacteremia due to Staphylococcus aureus Active Problems:   Anemia in chronic kidney disease   Hepatitis C   ESRD on dialysis (HCC)   S/P mitral valve replacement   S/P AKA (above knee amputation), right (HCC)   Personal history of Methicillin resistant Staphylococcus aureus infection   SIRS (systemic inflammatory response syndrome) (HCC)  Severe sepsis secondary to an MRSA bacteremia and Prostetic Valve Endocarditis present on admission -Presented the fever up to 103 F, tachycardia, tachypnea, a new oxygen requirement, altered mental status as well as a leukocytosis of 20,200 and also with hypotension -Chest x-ray on admission only noted low lung volumes and possible signs of atelectasis with no clear source of infection appreciated at that time -Blood  cultures now revealing an MRSA bacteremia; Repeat Blood Cx showed no growth to date but will obtain another set today given that he became febrile and tachycardic -He has been admitted progressive bed -He was initiated on IV antibiotics with vancomycin and cefepime but cefepime has now been discontinued -Infectious diseases has been consulted and plan further work-up for the patient and have ordered a repeat TEE and discussed with cardiology given his prostatic mitral valve and also will be getting a T-spine MRI with and without contrast with dialysis to be done afterwards; MRI done and showed that he has early changes of discitis and osteomyelitis at the T10-T11 level -Patient did get TEE which showed "Possible small vegetations associated with the bioprosthetic mitral valve and native aortic valve. No LAA thrombus. Negative for PFO. Moderate LVH Severe RA/RV dilitation with mild TR LVEF 60-65%." -We will be continuing vancomycin but because of Concern for possible Valvular Vegatations including a small, moblile Filamentous Structure on the posterior bioprosthetic vavlve leaflet was concerning for vegetation, ID now recommending PVE Coverage and will start Gentamicin and Add Rifampin once CX's Clear -procalcitonin level was 28.50 -> 28.84 -> 23.98 -CRP was 17.2 -Repeat blood cultures have been planned for 12/24/2020 -Patient continues  to have persistent sepsis physiology as he continues to spike temperatures and becomes tachycardic and tachypneic in the setting of his bacteremia -Appreciate further ID evaluation recommendations; sepsis physiology was improving but patient continues to still spike Temperatures and becomes intermittently tachycardic and tachypenic  -Patient's WBC minimally improved and went from 20.2 -> 19.7 -> 21.1 -> 28.9 -> 23.8 -> 20.4 -> 18.8 -Now infectious diseases recommending referral to cardiothoracic surgery and continuing gentamicin and vancomycin and adding rifampin later once  blood cultures remain negative; cardiothoracic surgery feels that he is not a candidate for redoing his surgical intervention -Repeat Blood Cx's from 12/26/20 POSITIVE AGAIN but the ones drawn a few days ago showed no growth to date -Infectious diseases to repeat blood cultures on 12/27/2020 showing NGTD <24 Hours so far  --Blood cultures initially positive on 5/18 and cleared on 5/20.  However repeat blood cultures on 522, 5/23 and 5/25 positive for MRSA Not a surgical candidate per T CTS. Plan: --cont IV vanc and gentamicin, per ID --repeat blood cx today  Acute CVAs recurrent cardioembolic strokes  in the setting of septic emboli from infective endocarditis -MRI of the brain overnight done and showed "Numerous punctate foci of abnormal diffusion restriction within both hemispheres in multiple vascular territories, consistent with acute infarcts.  Multiple scattered microhemorrhages in a nonspecific pattern." -Neurology was consulted and patient already had most of the stroke work-up done in the last few admissions and they did not recommend repeating echocardiogram as he had a TEE 520 confirming infective endocarditis -Neurology recommended holding antiplatelets at the time given risk of hemorrhagic conversion -DVT prophylaxis changed from heparin to SCDs and recommending resuming in 1 week -Neurology recommended continue telemetry monitoring and a 30-day event monitor on discharge if no arrhythmias were captured  Elevated troponin 2/2 demand ischemia -Troponin was elevated and went to 774 and likely in the setting of demand ischemia in the setting of his acute illness with bacteremia and tachyarrhythmias; he did have normal coronary arteries in 10/2020  Arrhythmia/new onset SVT -Patient was found to be in SVT this morning and tried vagal maneuvers without success He was given a beta-blocker early this morning as well with out any improvement.  He is also given 250 normal saline  bolus -Cardiology was consulted and troponins were obtained and his troponin level was 613 and trended up to 774 -Cardiology evaluated and gave the patient 6 mg of adenosine which slowed his rhythm and it looked like a 2-1 a flutter block so they started him on diltiazem drip -Once his arrhythmia improved and his heart rate slowed down cardiology reviewed his telemetry and they felt that it was not a flutter and may just represent sinus tachycardia so the will transition to oral diltiazem. -Cardiology following appreciate evaluation and Dr. Gardiner Rhyme evaluated today and patient is currently in sinus tachycardia with rates in the 100 and has been switched to p.o. diltiazem 30 mg 4 times daily and now further consolidated to diltiazem 140 mg p.o. daily -It is unclear if he did have atrial flutter but he is not a good candidate for anticoagulation given his multiple cardioembolic events from his infective endocarditis. --cont cardizem --resume home Lopressor today  Hypotension, resolved -In the setting of MRSA bacteremia his admission blood pressures were as low as 84/51 but have now improved -Improved with IV fluid resuscitation -Continue to monitor blood pressures per protocol; blood pressures are still on the softer side but last blood pressure reading was 160/110 -His home blood  pressure medications include clonidine 0.2 mg p.o. twice daily as well as metoprolol tartrate 50 mg p.o. twice daily which have both been held -Goal MAP bolus of at least 65 --encourage oral hydration  HTN -His home blood pressure medications include clonidine 0.2 mg p.o. twice daily as well as metoprolol tartrate 50 mg p.o. twice daily which were held --cont cardizem --resume home Lopressor today  Acute metabolic encephalopathy in the setting of infection, resolved -He is noted to be alert and oriented x2 but was previously alert and oriented x4 -CT scan did not show any acute abnormalities; he does have a prior  history of mitral valve endocarditis with septic emboli to the brain and to the lung  ESRD on Hemodialysis Monday Wednesday  Secondary hyperparathyroidism -Patient has a history of poorly cystic kidney disease -iHD per nephrology  Hyponatremia, mild --stable  Acute Respiratory Failure with Hypoxia, resolved -Was placed on 2 L supplemental oxygen via nasal cannula and does not wear supplemental oxygen at home -Chest x-ray yesterday showed "Lower lung volumes with atelectasis. Chronic cardiomegaly and right lung  Scarring." -Patient does have a history of MRSA bacteremia with septic emboli to the lungs --weaned down to RA  History of MRSA bacteremia with mitral valve endocarditis and PFO and septic emboli to the brain and left lung -He previously completed antibiotics on 11/25/2020 but now is back with another bacteremia as above so is now going to be on IV Vancomycin, IV Gentamicin, and IV Rifampin once Cx's clear   Anemia of chronic kidney disease --Hgb 9's --Monitor Hgb and transfuse to keep Hgb >7  Hypokalemia --monitor and replete PRN  Chronic Pain Syndrome with Chronic Opiate Dependence -He reports chronic back pain as well as right lower extremity that was recently amputated --cont oxycontin 15 mg q12h scheduled  Status post right AKA -Recently had an AKA done in the last 2 months -Continue with Pregabalin 75 mg p.o. nightly  Liver cirrhosis secondary to chronic hepatitis C -His HCC quantitative was 1,750,000 on 09/29/2020 -Infectious diseases been consulted given his MRSA bacteremia again and appreciate further evaluation recommendations.  GERD -cont PPI  Hyperbilirubinemia, mild --monitor   DVT prophylaxis: lovenox Code Status: FULL CODE  Family Communication:  Disposition Plan: Pending further clinical work-up and evaluation by infectious diseases as well as nephrology, Neurology, cardiology and TCTS  Status is: Inpatient  Dispo: The patient is from: Home               Anticipated d/c is to: Home              Patient currently is not medically stable to d/c.  Bacteremia not cleared.    Difficult to place patient No  Consultants:   Infectious Diseases  Nephrology  Cardiology   Neurology  Cardiothoracic Surgery  Palliative Care   Procedures:  TEE done by Dr. Debara Pickett  Findings:  1. LEFT VENTRICLE: The left ventricular wall thickness is moderately increased.  The left ventricular cavity is normal in size. Wall motion is normal.  LVEF is 60-65%.  2. RIGHT VENTRICLE:  The right ventricle is severely dilated without any thrombus or masses.    3. LEFT ATRIUM:  The left atrium is normal in size without any thrombus or masses.  There is not spontaneous echo contrast ("smoke") in the left atrium consistent with a low flow state.  4. LEFT ATRIAL APPENDAGE:  The large left atrial appendage is free of any thrombus or masses. The appendage has single lobes. Pulse  doppler indicates high flow in the appendage.  5. ATRIAL SEPTUM:  The atrial septum appears intact and is free of thrombus and/or masses.  There is no evidence for interatrial shunting by color doppler and saline microbubble.  6. RIGHT ATRIUM:  The right atrium is severely dilated in size and function without any thrombus or masses.  7. 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 There is  trivial regurgitation.  There is a possible small mobile vegetation adherent to the posterior leaflet which appears filamentous, this could also be residual valve tissue or suture.   8. AORTIC VALVE:  The aortic valve is trileaflet, normal in structure and function with no regurgitation.  The valve was visualized with 2D/3D modes - there appears to be a small mobile density at the base of the left coronary cusp seen in the long-axis view on the outflow side of the leaflet, possibly consistent with vegetation.  9. TRICUSPID VALVE:  The tricuspid valve is normal  in structure and function with Mild regurgitation.  There were no vegetations or stenosis  10.  PULMONIC VALVE:  The pulmonic valve is normal in structure and function with trivial regurgitation.  There were no vegetations or stenosis.   11. AORTIC ARCH, ASCENDING AND DESCENDING AORTA:  There was grade 2 Ron Parker et. Al, 1992) atherosclerosis of the ascending aorta, aortic arch, or proximal descending aorta.  12. PULMONARY VEINS: Anomalous pulmonary venous return was not noted.  13. PERICARDIUM: The pericardium appeared normal and non-thickened.  There is no pericardial effusion.  IMPRESSION:   1. 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  2. No LAA thrombus 3. Negative for PFO 4. Moderate LVH 5. Severe RA/RV dilitation with mild TR 6. LVEF 60-65%  RECOMMENDATIONS:    1. Consider longer course of antibiotics for possible valvular vegetations, including a small, mobile filamentous structure on the posterior bioprosthetic valve leaflet which is concerning for vegetation.  Antimicrobials:  Anti-infectives (From admission, onward)   Start     Dose/Rate Route Frequency Ordered Stop   12/31/20 1152  vancomycin (VANCOREADY) 750 MG/150ML IVPB       Note to Pharmacy: Gaynelle Adu  : cabinet override      12/31/20 1152 12/31/20 1218   12/29/20 1400  gentamicin (GARAMYCIN) IVPB 100 mg        100 mg 200 mL/hr over 30 Minutes Intravenous Every M-W-F 12/29/20 1312     12/29/20 0932  vancomycin (VANCOREADY) 750 MG/150ML IVPB       Note to Pharmacy: Judieth Keens  : cabinet override      12/29/20 0932 12/29/20 1114   12/27/20 1800  gentamicin (GARAMYCIN) IVPB 80 mg  Status:  Discontinued        80 mg 100 mL/hr over 30 Minutes Intravenous Every M-W-F (1800) 12/24/20 1612 12/29/20 1312   12/27/20 0813  vancomycin (VANCOREADY) 750 MG/150ML IVPB  Status:  Discontinued       Note to Pharmacy: Louretta Shorten   : cabinet  override      12/27/20 0813 12/27/20 0815   12/24/20 1700  gentamicin (GARAMYCIN) IVPB 120 mg        120 mg 200 mL/hr over 30 Minutes Intravenous  Once 12/24/20 1612 12/24/20 1910   12/24/20 1430  vancomycin (VANCOCIN) IVPB 750 mg/150 ml premix        750 mg 150 mL/hr over 60 Minutes Intravenous Every M-W-F (Hemodialysis) 12/24/20 1324  12/22/20 1201  vancomycin variable dose per unstable renal function (pharmacist dosing)  Status:  Discontinued         Does not apply See admin instructions 12/22/20 1202 12/24/20 1324   12/22/20 1200  vancomycin (VANCOREADY) IVPB 750 mg/150 mL  Status:  Discontinued        750 mg 150 mL/hr over 60 Minutes Intravenous Every M-W-F (Hemodialysis) 12/22/20 0845 12/22/20 1202   12/22/20 1200  ceFEPIme (MAXIPIME) 2 g in sodium chloride 0.9 % 100 mL IVPB  Status:  Discontinued        2 g 200 mL/hr over 30 Minutes Intravenous Every M-W-F (Hemodialysis) 12/22/20 0845 12/23/20 1450   12/22/20 0715  vancomycin (VANCOREADY) IVPB 1500 mg/300 mL        1,500 mg 150 mL/hr over 120 Minutes Intravenous  Once 12/22/20 0714 12/22/20 1034   12/22/20 0715  ceFEPIme (MAXIPIME) 2 g in sodium chloride 0.9 % 100 mL IVPB        2 g 200 mL/hr over 30 Minutes Intravenous  Once 12/22/20 0714 12/22/20 0806         Subjective: Pt denied pain.  Normal oral intake.   Objective: Vitals:   12/31/20 1300 12/31/20 1330 12/31/20 1431 12/31/20 1620  BP: (!) 151/119 (!) 167/127 (!) 163/106 101/77  Pulse: (!) 125 (!) 126 (!) 138 (!) 121  Resp:  '19 20 14  '$ Temp:   98.6 F (37 C) 98.8 F (37.1 C)  TempSrc:   Oral Oral  SpO2:   96% 95%  Weight:   70.9 kg   Height:        Intake/Output Summary (Last 24 hours) at 12/31/2020 1757 Last data filed at 12/31/2020 0826 Gross per 24 hour  Intake 429.89 ml  Output --  Net 429.89 ml   Filed Weights   12/31/20 0531 12/31/20 0925 12/31/20 1431  Weight: 70.2 kg 70.9 kg 70.9 kg   Examination:  Constitutional: NAD, AAOx3, in  dialysis HEENT: conjunctivae and lids normal, EOMI CV: No cyanosis.   RESP: normal respiratory effort, on RA SKIN: warm, dry Neuro: II - XII grossly intact.   Psych: Normal mood and affect.  Appropriate judgement and reason   Data Reviewed: I have personally reviewed following labs and imaging studies  CBC: Recent Labs  Lab 12/25/20 0056 12/26/20 0117 12/27/20 0218 12/28/20 0222 12/29/20 0035 12/30/20 0158 12/31/20 0100  WBC 28.9* 23.8* 20.4* 18.8* 20.6* 19.4* 22.6*  NEUTROABS 23.7* 19.2* 15.3* 14.1* 17.1*  --   --   HGB 10.0* 9.7* 9.2* 9.6* 9.5* 9.2* 9.2*  HCT 32.8* 32.3* 30.7* 32.4* 31.0* 30.8* 30.4*  MCV 80.6 79.2* 78.9* 80.0 79.3* 79.6* 78.8*  PLT 262 315 330 337 317 330 [id number]   Basic Metabolic Panel: Recent Labs  Lab 12/25/20 0056 12/26/20 0117 12/27/20 0218 12/28/20 0222 12/29/20 0035 12/30/20 0158 12/31/20 0100  NA 137 133* 132* 134* 133*  133* 131* 131*  K 3.3* 3.4* 3.3* 3.6 4.1  4.0 4.0 4.6  CL 98 95* 95* 94* 94*  94* 94* 94*  CO2 '24 26 22 27 26  25 25 22  '$ GLUCOSE 90 107* 96 95 93  92 83 77  BUN 26* 37* 48* 25* 35*  34* 22* 38*  CREATININE 4.78* 5.95* 7.45* 4.94* 6.51*  6.40* 4.37* 6.47*  CALCIUM 8.7* 9.2 9.4 9.1 9.5  9.6 8.7* 9.4  MG 2.0 2.2 2.3 2.1 2.2 2.0 2.0  PHOS 1.5* 2.4* 3.0 2.3* 3.7  3.7  --   --  GFR: Estimated Creatinine Clearance: 13.9 mL/min (A) (by C-G formula based on SCr of 6.47 mg/dL (H)). Liver Function Tests: Recent Labs  Lab 12/25/20 0056 12/26/20 0117 12/27/20 0218 12/28/20 0222 12/29/20 0035  AST '22 25 22 25 28  '$ ALT '10 9 10 10 11  '$ ALKPHOS 81 76 71 87 74  BILITOT 1.2 1.1 1.5* 0.8 1.9*  PROT 8.5* 8.4* 8.7* 8.3* 8.2*  ALBUMIN 2.2* 2.2* 2.1* 2.2* 2.2*  2.1*   No results for input(s): LIPASE, AMYLASE in the last 168 hours. No results for input(s): AMMONIA in the last 168 hours. Coagulation Profile: No results for input(s): INR, PROTIME in the last 168 hours. Cardiac Enzymes: No results for input(s): CKTOTAL,  CKMB, CKMBINDEX, TROPONINI in the last 168 hours. BNP (last 3 results) No results for input(s): PROBNP in the last 8760 hours. HbA1C: No results for input(s): HGBA1C in the last 72 hours. CBG: No results for input(s): GLUCAP in the last 168 hours. Lipid Profile: No results for input(s): CHOL, HDL, LDLCALC, TRIG, CHOLHDL, LDLDIRECT in the last 72 hours. Thyroid Function Tests: No results for input(s): TSH, T4TOTAL, FREET4, T3FREE, THYROIDAB in the last 72 hours. Anemia Panel: No results for input(s): VITAMINB12, FOLATE, FERRITIN, TIBC, IRON, RETICCTPCT in the last 72 hours. Sepsis Labs: Recent Labs  Lab 12/26/20 1019 12/27/20 0218 12/28/20 0222  PROCALCITON 28.50 28.84 23.98    Recent Results (from the past 240 hour(s))  Resp Panel by RT-PCR (Flu A&B, Covid) Nasopharyngeal Swab     Status: None   Collection Time: 12/22/20  7:14 AM   Specimen: Nasopharyngeal Swab; Nasopharyngeal(NP) swabs in vial transport medium  Result Value Ref Range Status   SARS Coronavirus 2 by RT PCR NEGATIVE NEGATIVE Final    Comment: (NOTE) SARS-CoV-2 target nucleic acids are NOT DETECTED.  The SARS-CoV-2 RNA is generally detectable in upper respiratory specimens during the acute phase of infection. The lowest concentration of SARS-CoV-2 viral copies this assay can detect is 138 copies/mL. A negative result does not preclude SARS-Cov-2 infection and should not be used as the sole basis for treatment or other patient management decisions. A negative result may occur with  improper specimen collection/handling, submission of specimen other than nasopharyngeal swab, presence of viral mutation(s) within the areas targeted by this assay, and inadequate number of viral copies(<138 copies/mL). A negative result must be combined with clinical observations, patient history, and epidemiological information. The expected result is Negative.  Fact Sheet for Patients:   EntrepreneurPulse.com.au  Fact Sheet for Healthcare Providers:  IncredibleEmployment.be  This test is no t yet approved or cleared by the [place] FDA and  has been authorized for detection and/or diagnosis of SARS-CoV-2 by FDA under an Emergency Use Authorization (EUA). This EUA will remain  in effect (meaning this test can be used) for the duration of the COVID-19 declaration under Section 564(b)(1) of the Act, 21 U.S.C.section 360bbb-3(b)(1), unless the authorization is terminated  or revoked sooner.       Influenza A by PCR NEGATIVE NEGATIVE Final   Influenza B by PCR NEGATIVE NEGATIVE Final    Comment: (NOTE) The Xpert Xpress SARS-CoV-2/FLU/RSV plus assay is intended as an aid in the diagnosis of influenza from Nasopharyngeal swab specimens and should not be used as a sole basis for treatment. Nasal washings and aspirates are unacceptable for Xpert Xpress SARS-CoV-2/FLU/RSV testing.  Fact Sheet for Patients: EntrepreneurPulse.com.au  Fact Sheet for Healthcare Providers: [email or web address]  This test is not yet approved or cleared by the [place]  FDA and has been authorized for detection and/or diagnosis of SARS-CoV-2 by FDA under an Emergency Use Authorization (EUA). This EUA will remain in effect (meaning this test can be used) for the duration of the COVID-19 declaration under Section 564(b)(1) of the Act, 21 U.S.C. section 360bbb-3(b)(1), unless the authorization is terminated or revoked.  Performed at [facility], [place] [address] [place], [place] [address]   Blood Culture (routine x 2)     Status: Abnormal   Collection Time: 12/22/20  7:14 AM   Specimen: BLOOD  Result Value Ref Range Status   Specimen Description BLOOD SITE NOT SPECIFIED  Final   Special Requests   Final    BOTTLES DRAWN AEROBIC AND ANAEROBIC Blood Culture adequate volume   Culture  Setup Time   Final     GRAM POSITIVE COCCI IN CLUSTERS IN BOTH AEROBIC AND ANAEROBIC BOTTLES CRITICAL RESULT CALLED TO, READ BACK BY AND VERIFIED WITHDion Body [facility] 2028 12/22/20 A BROWNING Performed at [facility], [place] [address] [place], [place] [address]    Culture METHICILLIN RESISTANT STAPHYLOCOCCUS AUREUS (A)  Final   Report Status 12/24/2020 FINAL  Final   Organism ID, Bacteria METHICILLIN RESISTANT STAPHYLOCOCCUS AUREUS  Final      Susceptibility   Methicillin resistant staphylococcus aureus - MIC*    CIPROFLOXACIN 4 RESISTANT Resistant     ERYTHROMYCIN >=8 RESISTANT Resistant     GENTAMICIN <=0.5 SENSITIVE Sensitive     OXACILLIN >=4 RESISTANT Resistant     TETRACYCLINE <=1 SENSITIVE Sensitive     VANCOMYCIN 1 SENSITIVE Sensitive     TRIMETH/SULFA <=10 SENSITIVE Sensitive     CLINDAMYCIN <=0.25 SENSITIVE Sensitive     RIFAMPIN <=0.5 SENSITIVE Sensitive     Inducible Clindamycin NEGATIVE Sensitive     * METHICILLIN RESISTANT STAPHYLOCOCCUS AUREUS  Blood Culture ID Panel (Reflexed)     Status: Abnormal   Collection Time: 12/22/20  7:14 AM  Result Value Ref Range Status   Enterococcus faecalis NOT DETECTED NOT DETECTED Final   Enterococcus Faecium NOT DETECTED NOT DETECTED Final   Listeria monocytogenes NOT DETECTED NOT DETECTED Final   Staphylococcus species DETECTED (A) NOT DETECTED Final    Comment: CRITICAL RESULT CALLED TO, READ BACK BY AND VERIFIED WITH: J MILLEN PHARMD 2028 12/22/20 A BROWNING    Staphylococcus aureus (BCID) DETECTED (A) NOT DETECTED Final    Comment: Methicillin (oxacillin)-resistant Staphylococcus aureus (MRSA). MRSA is predictably resistant to beta-lactam antibiotics (except ceftaroline). Preferred therapy is vancomycin unless clinically contraindicated. Patient requires contact precautions if  hospitalized. CRITICAL RESULT CALLED TO, READ BACK BY AND VERIFIED WITH: Dion Body PHARMD 2028 12/22/20 A BROWNING    Staphylococcus epidermidis NOT DETECTED NOT  DETECTED Final   Staphylococcus lugdunensis NOT DETECTED NOT DETECTED Final   Streptococcus species NOT DETECTED NOT DETECTED Final   Streptococcus agalactiae NOT DETECTED NOT DETECTED Final   Streptococcus pneumoniae NOT DETECTED NOT DETECTED Final   Streptococcus pyogenes NOT DETECTED NOT DETECTED Final   A.calcoaceticus-baumannii NOT DETECTED NOT DETECTED Final   Bacteroides fragilis NOT DETECTED NOT DETECTED Final   Enterobacterales NOT DETECTED NOT DETECTED Final   Enterobacter cloacae complex NOT DETECTED NOT DETECTED Final   Escherichia coli NOT DETECTED NOT DETECTED Final   Klebsiella aerogenes NOT DETECTED NOT DETECTED Final   Klebsiella oxytoca NOT DETECTED NOT DETECTED Final   Klebsiella pneumoniae NOT DETECTED NOT DETECTED Final   Proteus species NOT DETECTED NOT DETECTED Final   Salmonella species NOT DETECTED  NOT DETECTED Final   Serratia marcescens NOT DETECTED NOT DETECTED Final   Haemophilus influenzae NOT DETECTED NOT DETECTED Final   Neisseria meningitidis NOT DETECTED NOT DETECTED Final   Pseudomonas aeruginosa NOT DETECTED NOT DETECTED Final   Stenotrophomonas maltophilia NOT DETECTED NOT DETECTED Final   Candida albicans NOT DETECTED NOT DETECTED Final   Candida auris NOT DETECTED NOT DETECTED Final   Candida glabrata NOT DETECTED NOT DETECTED Final   Candida krusei NOT DETECTED NOT DETECTED Final   Candida parapsilosis NOT DETECTED NOT DETECTED Final   Candida tropicalis NOT DETECTED NOT DETECTED Final   Cryptococcus neoformans/gattii NOT DETECTED NOT DETECTED Final   Meth resistant mecA/C and MREJ DETECTED (A) NOT DETECTED Final    Comment: CRITICAL RESULT CALLED TO, READ BACK BY AND VERIFIED WITHDion Body [facility] 2028 12/22/20 A BROWNING Performed at [facility], [address] [place], [place] [address]   Blood Culture (routine x 2)     Status: Abnormal   Collection Time: 12/22/20  7:19 AM   Specimen: BLOOD  Result Value Ref Range Status    Specimen Description BLOOD SITE NOT SPECIFIED  Final   Special Requests   Final    BOTTLES DRAWN AEROBIC AND ANAEROBIC Blood Culture results may not be optimal due to an excessive volume of blood received in culture bottles   Culture  Setup Time   Final    GRAM POSITIVE COCCI IN CLUSTERS IN BOTH AEROBIC AND ANAEROBIC BOTTLES IDENTIFICATION TO FOLLOW CRITICAL RESULT CALLED TO, READ BACK BY AND VERIFIED WITH: J MILLEN PHARMD 2028 12/22/20 A BROWNING    Culture (A)  Final    STAPHYLOCOCCUS AUREUS SUSCEPTIBILITIES PERFORMED ON PREVIOUS CULTURE WITHIN THE LAST 5 DAYS. Performed at [facility], [place] [address], [place], [place] [address]    Report Status 12/24/2020 FINAL  Final  Culture, blood (routine x 2)     Status: None   Collection Time: 12/24/20  3:10 PM   Specimen: BLOOD RIGHT HAND  Result Value Ref Range Status   Specimen Description BLOOD RIGHT HAND  Final   Special Requests   Final    BOTTLES DRAWN AEROBIC ONLY Blood Culture adequate volume   Culture   Final    NO GROWTH 5 DAYS Performed at [facility], [place] [address], [place], [place] [address]    Report Status 12/29/2020 FINAL  Final  Culture, blood (routine x 2)     Status: None   Collection Time: 12/24/20  3:37 PM   Specimen: BLOOD  Result Value Ref Range Status   Specimen Description BLOOD BLOOD RIGHT FOREARM  Final   Special Requests   Final    BOTTLES DRAWN AEROBIC ONLY Blood Culture adequate volume   Culture   Final    NO GROWTH 5 DAYS Performed at [facility], [place] [address], [place], [place] [address]    Report Status 12/29/2020 FINAL  Final  MRSA PCR Screening     Status: None   Collection Time: 12/25/20 10:18 PM   Specimen: Nasal Mucosa; Nasopharyngeal  Result Value Ref Range Status   MRSA by PCR NEGATIVE NEGATIVE Final    Comment:        The GeneXpert MRSA Assay (FDA approved for NASAL specimens only), is one component of a comprehensive MRSA colonization surveillance program. It is  not intended to diagnose MRSA infection nor to guide or monitor treatment for MRSA infections. Performed at [facility], [place]  [address], [place], [place] [address]   Culture, blood (routine x 2)     Status: Abnormal   Collection Time: 12/26/20 10:19 AM   Specimen: BLOOD RIGHT HAND  Result Value Ref Range Status   Specimen Description BLOOD RIGHT HAND  Final   Special Requests   Final    BOTTLES DRAWN AEROBIC ONLY Blood Culture results may not be optimal due to an inadequate volume of blood received in culture bottles   Culture  Setup Time   Final    GRAM POSITIVE COCCI IN CLUSTERS AEROBIC BOTTLE ONLY CRITICAL RESULT CALLED TO, READ BACK BY AND VERIFIED WITH: PHARMD TM TUCKER [id number] AT 36 BY CM    Culture (A)  Final    STAPHYLOCOCCUS AUREUS SUSCEPTIBILITIES PERFORMED ON PREVIOUS CULTURE WITHIN THE LAST 5 DAYS. Performed at [facility], [place] [address] [place], [place] [address]    Report Status 12/29/2020 FINAL  Final  Culture, blood (routine x 2)     Status: Abnormal (Preliminary result)   Collection Time: 12/26/20 10:19 AM   Specimen: BLOOD RIGHT WRIST  Result Value Ref Range Status   Specimen Description BLOOD RIGHT WRIST  Final   Special Requests   Final    BOTTLES DRAWN AEROBIC ONLY Blood Culture results may not be optimal due to an inadequate volume of blood received in culture bottles   Culture  Setup Time   Final    GRAM POSITIVE COCCI IN CLUSTERS AEROBIC BOTTLE ONLY CRITICAL RESULT CALLED TO, READ BACK BY AND VERIFIED WITH: PHARMD M TUCKER [id number] AT 56 BY CM    Culture (A)  Final    METHICILLIN RESISTANT STAPHYLOCOCCUS AUREUS Sent to [facility] for further susceptibility testing. Performed at [facility], [place] [address] [place], [place] [address]    Report Status PENDING  Incomplete   Organism ID, Bacteria METHICILLIN RESISTANT STAPHYLOCOCCUS AUREUS  Final      Susceptibility   Methicillin resistant staphylococcus aureus - MIC*     CIPROFLOXACIN >=8 RESISTANT Resistant     ERYTHROMYCIN >=8 RESISTANT Resistant     GENTAMICIN <=0.5 SENSITIVE Sensitive     OXACILLIN >=4 RESISTANT Resistant     TETRACYCLINE <=1 SENSITIVE Sensitive     VANCOMYCIN 1 SENSITIVE Sensitive     TRIMETH/SULFA <=10 SENSITIVE Sensitive     CLINDAMYCIN <=0.25 SENSITIVE Sensitive     RIFAMPIN <=0.5 SENSITIVE Sensitive     Inducible Clindamycin NEGATIVE Sensitive     * METHICILLIN RESISTANT STAPHYLOCOCCUS AUREUS  Culture, blood (routine x 2)     Status: None (Preliminary result)   Collection Time: 12/27/20  2:17 PM   Specimen: BLOOD  Result Value Ref Range Status   Specimen Description BLOOD THUMB  Final   Special Requests   Final    BOTTLES DRAWN AEROBIC AND ANAEROBIC Blood Culture adequate volume   Culture  Setup Time   Final    GRAM POSITIVE COCCI ANAEROBIC BOTTLE ONLY CRITICAL VALUE NOTED.  VALUE IS CONSISTENT WITH PREVIOUSLY REPORTED AND CALLED VALUE. Performed at [facility], [place] [address] [place],  [address]    Culture GRAM POSITIVE COCCI  Final   Report Status PENDING  Incomplete  Culture, blood (routine x 2)     Status: Abnormal   Collection Time: 12/27/20  2:17 PM   Specimen: BLOOD RIGHT HAND  Result Value Ref Range Status   Specimen Description BLOOD RIGHT HAND  Final   Special Requests   Final    BOTTLES DRAWN  AEROBIC AND ANAEROBIC Blood Culture adequate volume   Culture  Setup Time   Final    GRAM POSITIVE COCCI IN CLUSTERS ANAEROBIC BOTTLE ONLY CRITICAL VALUE NOTED.  VALUE IS CONSISTENT WITH PREVIOUSLY REPORTED AND CALLED VALUE. Performed at [facility], [place] [address] [place], [place] [address]    Culture METHICILLIN RESISTANT STAPHYLOCOCCUS AUREUS (A)  Final   Report Status 12/31/2020 FINAL  Final   Organism ID, Bacteria METHICILLIN RESISTANT STAPHYLOCOCCUS AUREUS  Final      Susceptibility   Methicillin resistant staphylococcus aureus - MIC*    CIPROFLOXACIN >=8 RESISTANT Resistant      ERYTHROMYCIN >=8 RESISTANT Resistant     GENTAMICIN <=0.5 SENSITIVE Sensitive     OXACILLIN >=4 RESISTANT Resistant     TETRACYCLINE <=1 SENSITIVE Sensitive     VANCOMYCIN <=0.5 SENSITIVE Sensitive     TRIMETH/SULFA <=10 SENSITIVE Sensitive     CLINDAMYCIN <=0.25 SENSITIVE Sensitive     RIFAMPIN <=0.5 SENSITIVE Sensitive     Inducible Clindamycin NEGATIVE Sensitive     * METHICILLIN RESISTANT STAPHYLOCOCCUS AUREUS  Culture, blood (routine x 2)     Status: Abnormal (Preliminary result)   Collection Time: 12/29/20  1:43 PM   Specimen: BLOOD RIGHT HAND  Result Value Ref Range Status   Specimen Description BLOOD RIGHT HAND  Final   Special Requests   Final    BOTTLES DRAWN AEROBIC AND ANAEROBIC Blood Culture adequate volume   Culture  Setup Time   Final    GRAM POSITIVE COCCI ANAEROBIC BOTTLE ONLY CRITICAL VALUE NOTED.  VALUE IS CONSISTENT WITH PREVIOUSLY REPORTED AND CALLED VALUE.    Culture (A)  Final    STAPHYLOCOCCUS AUREUS SUSCEPTIBILITIES PERFORMED ON PREVIOUS CULTURE WITHIN THE LAST 5 DAYS. Performed at [facility], [place] [address] [place], [place] [address]    Report Status PENDING  Incomplete  Culture, blood (routine x 2)     Status: None (Preliminary result)   Collection Time: 12/29/20  1:43 PM   Specimen: BLOOD RIGHT HAND  Result Value Ref Range Status   Specimen Description BLOOD RIGHT HAND  Final   Special Requests   Final    BOTTLES DRAWN AEROBIC AND ANAEROBIC Blood Culture adequate volume   Culture   Final    NO GROWTH 2 DAYS Performed at [facility], [place] [address], [place],  [address]    Report Status PENDING  Incomplete    RN Pressure Injury Documentation:     Estimated body mass index is 21.2 kg/m as calculated from the following:   Height as of this encounter: 6' (1.829 m).   Weight as of this encounter: 70.9 kg.  Malnutrition Type:   Malnutrition Characteristics:   Nutrition Interventions:   Radiology Studies: No results  found. Scheduled Meds: . (feeding supplement) PROSource Plus  30 mL Oral BID BM  . [START ON 01/01/2021] aspirin EC  325 mg Oral Daily  . Chlorhexidine Gluconate Cloth  6 each Topical Q0600  . cinacalcet  60 mg Oral Q supper  . diltiazem  120 mg Oral Daily  . docusate sodium  100 mg Oral BID  . [START ON 01/01/2021] enoxaparin (LOVENOX) injection  30 mg Subcutaneous Q24H  . feeding supplement (NEPRO CARB STEADY)  237 mL Oral TID BM  . methocarbamol  500 mg Oral TID  . metoprolol tartrate  50 mg Oral BID  . multivitamin with minerals  1 tablet Oral Daily  . oxyCODONE  15 mg Oral  Q12H  . pantoprazole  40 mg Oral Daily  . pregabalin  75 mg Oral QHS  . senna-docusate  1 tablet Oral BID  . sodium chloride flush  3 mL Intravenous Q12H   Continuous Infusions: . gentamicin 100 mg (12/31/20 1446)  . vancomycin Stopped (12/27/20 1214)    LOS: 9 days   Enzo Bi, md [facility] PAGER is on [place]  If 7PM-7AM, please contact night-coverage [email or web address]

## 2020-12-31 NOTE — NC FL2 (Addendum)
[place] LEVEL OF CARE SCREENING TOOL     IDENTIFICATION  Patient Name: Patrick Anthony Birthdate: 05/13/1971 Sex: male Admission Date (Current Location): 12/22/2020  [facility] and [place] Number:  Herbalist and Address:  [facility], [place] [address], [place], [place] [address]      Provider Number: [id number]  Attending Physician Name and Address:  Enzo Bi, MD  Relative Name and Phone Number:  [name], daughter, [phone]    Current Level of Care: Hospital Recommended Level of Care: [place] Prior Approval Number:    Date Approved/Denied:   PASRR Number: [id number]  Discharge Plan: SNF    Current Diagnoses: Patient Active Problem List   Diagnosis Date Noted  . SIRS (systemic inflammatory response syndrome) ([place]) 12/22/2020  . Moderate protein-calorie malnutrition ([place]) 11/11/2020  . Other acute osteomyelitis, right tibia and fibula ([place]) 11/11/2020  . S/P AKA (above knee amputation), right ([place])   . 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 Chronic osteomyelitis of right tibia with draining sinus ([place])   . Abscess of right leg   . S/P mitral valve replacement   . S/P mitral valve repair   . S/P minimally-invasive mitral valve replacement with bioprosthetic valve 10/14/2020  . Dental caries   . Chronic apical periodontitis   . Chronic periodontitis   . Accretions on teeth   . Mitral regurgitation   . Uncontrolled hypertension   . Patent foramen ovale   . Cerebral embolism with cerebral infarction 09/29/2020  . 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 Bacteremia due to Staphylococcus aureus   . 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 Lactic acidosis 09/27/2020  . Bacterial endocarditis 09/27/2020  . Allergy, unspecified, subsequent encounter 04/15/2020  . Anaphylactic shock,  unspecified, subsequent encounter 04/15/2020  . Complication of vascular dialysis catheter 10/31/2019  . Kidney disease 02/04/2019  . Personal history of Methicillin resistant Staphylococcus aureus infection 09/11/2018  . Encounter for immunization 09/07/2017  . Dependence on renal dialysis ([place]) 03/07/2017  . Iron deficiency anemia, unspecified 11/15/2015  . Coagulation defect, unspecified ([place]) 10/08/2015  . Gastrointestinal hemorrhage, unspecified 10/08/2015  . Pain, unspecified 10/08/2015  . Secondary hyperparathyroidism of renal origin ([place]) 10/08/2015  . End stage renal disease ([place]) 10/17/2013  . Anemia in chronic kidney disease 08/30/2013  . Hepatitis C 08/30/2013  . ESRD on dialysis ([place]) 08/30/2013  . Hypertensive crisis 08/28/2013  . Hypertensive emergency 04/20/2013  . Chronic combined systolic and diastolic congestive heart failure ([place]) 04/19/2013  . HTN (hypertension) 04/19/2013    Orientation RESPIRATION BLADDER Height & Weight     Self,Time,Situation,Place  Normal Continent Weight: 156 lb 4.9 oz (70.9 kg) Height:  6' (182.9 cm)  BEHAVIORAL SYMPTOMS/MOOD NEUROLOGICAL BOWEL NUTRITION STATUS      Continent Diet (Please see DC Summary)  AMBULATORY STATUS COMMUNICATION OF NEEDS Skin   Extensive Assist Verbally Normal                       Personal Care Assistance Level of Assistance  Bathing,Feeding,Dressing Bathing Assistance: Limited assistance Feeding assistance: Independent Dressing Assistance: Limited assistance     Functional Limitations Info             SPECIAL CARE FACTORS FREQUENCY  PT (By licensed PT),OT (By licensed OT)     PT Frequency: 5x/week OT Frequency: 5x/week  Contractures Contractures Info: Not present    Additional Factors Info  Code Status,Allergies,Isolation Precautions Code Status Info: Full Allergies Info: NKA     Isolation Precautions Info: MRSA     Current Medications (12/31/2020):  This is the  current hospital active medication list Current Facility-Administered Medications  Medication Dose Route Frequency Provider Last Rate Last Admin  . (feeding supplement) PROSource Plus liquid 30 mL  30 mL Oral BID BM Hilty, Nadean Corwin, MD      . acetaminophen (TYLENOL) tablet 650 mg  650 mg Oral Q6H PRN Pixie Casino, MD   650 mg at 12/29/20 1636   Or  . acetaminophen (TYLENOL) suppository 650 mg  650 mg Rectal Q6H PRN Pixie Casino, MD      . albuterol (PROVENTIL) (2.5 MG/3ML) 0.083% nebulizer solution 2.5 mg  2.5 mg Nebulization Q6H PRN Pixie Casino, MD      . Derrill Memo ON 01/01/2021] aspirin EC tablet 325 mg  325 mg Oral Daily Enzo Bi, MD      . bisacodyl (DULCOLAX) suppository 10 mg  10 mg Rectal Daily PRN Pixie Casino, MD      . Chlorhexidine Gluconate Cloth 2 % PADS 6 each  6 each Topical Q0600 Pixie Casino, MD   6 each at 12/30/20 [phone]  . cinacalcet (SENSIPAR) tablet 60 mg  60 mg Oral Q supper Pixie Casino, MD   60 mg at 12/28/20 1737  . diltiazem (CARDIZEM CD) 24 hr capsule 120 mg  120 mg Oral Daily Donato Heinz, MD   120 mg at 12/31/20 0857  . docusate sodium (COLACE) capsule 100 mg  100 mg Oral BID Pixie Casino, MD   100 mg at 12/31/20 0858  . [START ON 01/01/2021] enoxaparin (LOVENOX) injection 30 mg  30 mg Subcutaneous Q24H Enzo Bi, MD      . feeding supplement (NEPRO CARB STEADY) liquid 237 mL  237 mL Oral TID BM Pixie Casino, MD   237 mL at 12/27/20 2143  . gentamicin (GARAMYCIN) IVPB 100 mg  100 mg Intravenous Q M,W,F Pham, Minh Q, RPH-CPP 200 mL/hr at 12/31/20 1446 100 mg at 12/31/20 1446  . methocarbamol (ROBAXIN) tablet 500 mg  500 mg Oral TID Pixie Casino, MD   500 mg at 12/31/20 0857  . metoprolol tartrate (LOPRESSOR) tablet 50 mg  50 mg Oral BID Enzo Bi, MD   50 mg at 12/31/20 1442  . multivitamin with minerals tablet 1 tablet  1 tablet Oral Daily Pixie Casino, MD   1 tablet at 12/31/20 0857  . ondansetron (ZOFRAN) tablet 4 mg   4 mg Oral Q6H PRN Pixie Casino, MD       Or  . ondansetron (ZOFRAN) injection 4 mg  4 mg Intravenous Q6H PRN Pixie Casino, MD      . oxyCODONE (OXYCONTIN) 12 hr tablet 15 mg  15 mg Oral Q12H Pixie Casino, MD   15 mg at 12/31/20 0857  . pantoprazole (PROTONIX) EC tablet 40 mg  40 mg Oral Daily Pixie Casino, MD   40 mg at 12/31/20 0858  . polyethylene glycol (MIRALAX / GLYCOLAX) packet 17 g  17 g Oral Daily PRN Hilty, Nadean Corwin, MD      . pregabalin (LYRICA) capsule 75 mg  75 mg Oral QHS Pixie Casino, MD   75 mg at 12/30/20 2051  . senna-docusate (Senokot-S) tablet 1 tablet  1 tablet Oral BID Hilty,  Nadean Corwin, MD   1 tablet at 12/31/20 [phone]  . sodium chloride flush (NS) 0.9 % injection 3 mL  3 mL Intravenous Q12H Hilty, Nadean Corwin, MD   3 mL at 12/31/20 0901  . vancomycin (VANCOCIN) IVPB 750 mg/150 ml premix  750 mg Intravenous Q M,W,F-HD Pixie Casino, MD   Stopped at 12/27/20 1214   Facility-Administered Medications Ordered in Other Encounters  Medication Dose Route Frequency Provider Last Rate Last Admin  . regadenoson (LEXISCAN) injection SOLN 0.4 mg  0.4 mg Intravenous Once Cherlynn Kaiser A, MD      . technetium tetrofosmin (TC-MYOVIEW) injection [id number] millicurie  [id number] millicurie Intravenous Once PRN Cherlynn Kaiser A, MD      . technetium tetrofosmin (TC-MYOVIEW) injection [id number] millicurie  [id number] millicurie Intravenous Once PRN Elouise Munroe, MD         Discharge Medications: Please see discharge summary for a list of discharge medications.  Relevant Imaging Results:  Relevant Lab Results:   Additional Information [phone] Dialysis MWF.  Benard Halsted, LCSW

## 2020-12-31 NOTE — Plan of Care (Signed)
  Problem: Education: Goal: Knowledge of General Education information will improve Description: Including pain rating scale, medication(s)/side effects and non-pharmacologic comfort measures Outcome: Progressing   Problem: Health Behavior/Discharge Planning: Goal: Ability to manage health-related needs will improve Outcome: Progressing   Problem: Clinical Measurements: Goal: Ability to maintain clinical measurements within normal limits will improve Outcome: Progressing Goal: Will remain free from infection Outcome: Progressing Goal: Diagnostic test results will improve Outcome: Progressing Goal: Respiratory complications will improve Outcome: Progressing Goal: Cardiovascular complication will be avoided Outcome: Progressing   Problem: Activity: Goal: Risk for activity intolerance will decrease Outcome: Progressing   Problem: Nutrition: Goal: Adequate nutrition will be maintained Outcome: Progressing   Problem: Coping: Goal: Level of anxiety will decrease Outcome: Progressing   Problem: Elimination: Goal: Will not experience complications related to urinary retention Outcome: Progressing   Problem: Pain Managment: Goal: General experience of comfort will improve Outcome: Progressing   Problem: Safety: Goal: Ability to remain free from injury will improve Outcome: Progressing   Problem: Skin Integrity: Goal: Risk for impaired skin integrity will decrease Outcome: Progressing   Problem: Education: Goal: Knowledge of disease and its progression will improve Outcome: Progressing Goal: Individualized Educational Video(s) Outcome: Progressing   Problem: Fluid Volume: Goal: Compliance with measures to maintain balanced fluid volume will improve Outcome: Progressing   Problem: Health Behavior/Discharge Planning: Goal: Ability to manage health-related needs will improve Outcome: Progressing   Problem: Nutritional: Goal: Ability to make healthy dietary choices  will improve Outcome: Progressing   Problem: Clinical Measurements: Goal: Complications related to the disease process, condition or treatment will be avoided or minimized Outcome: Progressing   Problem: Elimination: Goal: Will not experience complications related to bowel motility Outcome: Not Progressing (pt unsure last time he had BM)

## 2020-12-31 NOTE — Progress Notes (Signed)
Pt states feeling asymptomatic, besides the new pain in his right pointer finger. Pt given scheduled evening meds and finger put in a pressure dressing to stabilize it. HR returned to the low 100s after implementing the interventions.   12/30/20 2018  Assess: MEWS Score  Temp 99.1 F (37.3 C)  BP (!) 149/104  Pulse Rate (!) 134  ECG Heart Rate (!) 134  Resp (!) 22  SpO2 92 %  Assess: MEWS Score  MEWS Temp 0  MEWS Systolic 0  MEWS Pulse 3  MEWS RR 1  MEWS LOC 0  MEWS Score 4  MEWS Score Color Red  Assess: if the MEWS score is Yellow or Red  Were vital signs taken at a resting state? Yes  Focused Assessment No change from prior assessment  Early Detection of Sepsis Score *See Row Information* Medium  MEWS guidelines implemented *See Row Information* Yes  Treat  MEWS Interventions Escalated (See documentation below)  Take Vital Signs  Increase Vital Sign Frequency  Red: Q 1hr X 4 then Q 4hr X 4, if remains red, continue Q 4hrs  Escalate  MEWS: Escalate Red: discuss with charge nurse/RN and provider, consider discussing with RRT  Notify: Charge Nurse/RN  Name of Charge Nurse/RN Notified Elmyra Ricks RN  Date Charge Nurse/RN Notified 12/30/20  Time Charge Nurse/RN Notified 2020  Notify: Provider  Provider Name/Title Hal Hope  Date Provider Notified 12/30/20  Time Provider Notified 2020  Notification Type Face-to-face  Notification Reason Change in status (red MEWS)  Provider response At bedside  Document  Patient Outcome Stabilized after interventions  Progress note created (see row info) Yes

## 2020-12-31 NOTE — Progress Notes (Signed)
Progress Note  Patient Name: Patrick Anthony Date of Encounter: 12/31/2020  [facility] Cardiologist: Fransico Him, MD   Subjective   Denies any chest pain or dyspnea.  Inpatient Medications    Scheduled Meds: . (feeding supplement) PROSource Plus  30 mL Oral BID BM  . Chlorhexidine Gluconate Cloth  6 each Topical Q0600  . cinacalcet  60 mg Oral Q supper  . diltiazem  120 mg Oral Daily  . docusate sodium  100 mg Oral BID  . feeding supplement (NEPRO CARB STEADY)  237 mL Oral TID BM  . methocarbamol  500 mg Oral TID  . multivitamin with minerals  1 tablet Oral Daily  . oxyCODONE  15 mg Oral Q12H  . pantoprazole  40 mg Oral Daily  . pregabalin  75 mg Oral QHS  . senna-docusate  1 tablet Oral BID  . sodium chloride flush  3 mL Intravenous Q12H   Continuous Infusions: . gentamicin 100 mg (12/29/20 1401)  . vancomycin Stopped (12/27/20 1214)   PRN Meds: acetaminophen **OR** acetaminophen, albuterol, bisacodyl, ondansetron **OR** ondansetron (ZOFRAN) IV, polyethylene glycol   Vital Signs    Vitals:   12/31/20 0531 12/31/20 0821 12/31/20 0925 12/31/20 0930  BP:  (!) 180/115 (!) 137/91 (!) 142/116  Pulse:  (!) 118 (!) 119 (!) 118  Resp:  19 17   Temp:  97.8 F (36.6 C) 99.1 F (37.3 C)   TempSrc:  Oral Oral   SpO2:  97%    Weight: 70.2 kg  70.9 kg   Height:        Intake/Output Summary (Last 24 hours) at 12/31/2020 0947 Last data filed at 12/31/2020 0826 Gross per 24 hour  Intake 789.89 ml  Output --  Net 789.89 ml   Last 3 Weights 12/31/2020 12/31/2020 12/30/2020  Weight (lbs) 156 lb 4.9 oz 154 lb 12.2 oz 153 lb 14.1 oz  Weight (kg) 70.9 kg 70.2 kg 69.8 kg      Telemetry    Sinus tachycardia 110-130s with first degree AV blocm- Personally Reviewed  ECG    No new ECG- Personally Reviewed  Physical Exam   GEN: No acute distress.   Neck: No JVD Cardiac: Tachycardic, regular, no murmurs Respiratory: Clear to auscultation bilaterally. GI: Soft,  nontender, non-distended  MS: No edema; No deformity. Neuro:  Nonfocal  Psych: Normal affect   Labs    High Sensitivity Troponin:   Recent Labs  Lab 12/26/20 1019 12/26/20 1214  TROPONINIHS 613* 774*      Chemistry Recent Labs  Lab 12/27/20 0218 12/28/20 0222 12/29/20 0035 12/30/20 0158 12/31/20 0100  NA 132* 134* 133*  133* 131* 131*  K 3.3* 3.6 4.1  4.0 4.0 4.6  CL 95* 94* 94*  94* 94* 94*  CO2 '22 27 26  25 25 22  '$ GLUCOSE 96 95 93  92 83 77  BUN 48* 25* 35*  34* 22* 38*  CREATININE 7.45* 4.94* 6.51*  6.40* 4.37* 6.47*  CALCIUM 9.4 9.1 9.5  9.6 8.7* 9.4  PROT 8.7* 8.3* 8.2*  --   --   ALBUMIN 2.1* 2.2* 2.2*  2.1*  --   --   AST '22 25 28  '$ --   --   ALT '10 10 11  '$ --   --   ALKPHOS 71 87 74  --   --   BILITOT 1.5* 0.8 1.9*  --   --   GFRNONAA 8* 14* 10*  10* 16* 10*  ANIONGAP 15  $'13 13  14 12 15     'J$ Hematology Recent Labs  Lab 12/29/20 0035 12/30/20 0158 12/31/20 0100  WBC 20.6* 19.4* 22.6*  RBC 3.91* 3.87* 3.86*  HGB 9.5* 9.2* 9.2*  HCT 31.0* 30.8* 30.4*  MCV 79.3* 79.6* 78.8*  MCH 24.3* 23.8* 23.8*  MCHC 30.6 29.9* 30.3  RDW 21.4* 21.3* 21.3*  PLT 317 330 290    BNPNo results for input(s): BNP, PROBNP in the last 168 hours.   DDimer No results for input(s): DDIMER in the last 168 hours.   Radiology    No results found.  Cardiac Studies    Patient Profile     50 y.o. 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  SVT, possibly atypical atrial flutter -He was given adenosine 6 mg with possible flutter waves per Dr Audie Box.  Buddy Duty on diltiazem drip.  Switched to diltiazem CD 120 mg daily -Unclear if atrial flutter and not a good candidate for anticoagulation given multiple cardioembolic infarcts from  mitral valve endocarditis with high risk of hemorrhagic conversion  2.  Bioprosthetic mitral valve endocarditis -Blood cultures on 5/18 growing MRSA.  EKG with first degree AV block. TEE 5/20 concerning for MV bioprosthetic endocarditis, trivial MR -Evaluated by Dr. Cyndia Bent, not a good candidate for redo mitral valve surgery. -Continue antibiotics per ID  3. Troponin elevation -Troponin peak 774 -Likely demand ischemia in setting of acute illness, with sepsis and tachyarrhythmia as above -Normal coronaries on cath 10/2020  4. Hypertension: BP persistently elevated, would start adding back home BP meds   [facility] will sign off.   Medication Recommendations:  Continue diltiazem 120 mg daily.  Abx per ID Other recommendations (labs, testing, etc):  None Follow up as an outpatient:  Will schedule   For questions or updates, please contact [place] Please consult [email or web address] for contact info under        Signed, Donato Heinz, MD  12/31/2020, 9:47 AM

## 2021-01-01 DIAGNOSIS — N186 End stage renal disease: Secondary | ICD-10-CM | POA: Diagnosis not present

## 2021-01-01 LAB — CBC
HCT: 34.5 % — ABNORMAL LOW (ref 39.0–52.0)
Hemoglobin: 10.3 g/dL — ABNORMAL LOW (ref 13.0–17.0)
MCH: 23.4 pg — ABNORMAL LOW (ref 26.0–34.0)
MCHC: 29.9 g/dL — ABNORMAL LOW (ref 30.0–36.0)
MCV: 78.4 fL — ABNORMAL LOW (ref 80.0–100.0)
Platelets: 338 10*3/uL (ref 150–400)
RBC: 4.4 MIL/uL (ref 4.22–5.81)
RDW: 21.2 % — ABNORMAL HIGH (ref 11.5–15.5)
WBC: 20.4 10*3/uL — ABNORMAL HIGH (ref 4.0–10.5)
nRBC: 0 % (ref 0.0–0.2)

## 2021-01-01 LAB — BASIC METABOLIC PANEL
Anion gap: 12 (ref 5–15)
BUN: 21 mg/dL — ABNORMAL HIGH (ref 6–20)
CO2: 25 mmol/L (ref 22–32)
Calcium: 9.1 mg/dL (ref 8.9–10.3)
Chloride: 96 mmol/L — ABNORMAL LOW (ref 98–111)
Creatinine, Ser: 4.32 mg/dL — ABNORMAL HIGH (ref 0.61–1.24)
GFR, Estimated: 16 mL/min — ABNORMAL LOW (ref >60)
Glucose, Bld: 86 mg/dL (ref 70–99)
Potassium: 4.1 mmol/L (ref 3.5–5.1)
Sodium: 133 mmol/L — ABNORMAL LOW (ref 135–145)

## 2021-01-01 LAB — CULTURE, BLOOD (ROUTINE X 2): Special Requests: ADEQUATE

## 2021-01-01 LAB — MAGNESIUM: Magnesium: 2.1 mg/dL (ref 1.7–2.4)

## 2021-01-01 MED ORDER — OXYCODONE HCL ER 10 MG PO T12A
10.0000 mg | EXTENDED_RELEASE_TABLET | Freq: Two times a day (BID) | ORAL | Status: DC
  Administered 2021-01-02 – 2021-01-06 (×9): 10 mg via ORAL
  Filled 2021-01-01 (×11): qty 1

## 2021-01-01 NOTE — Progress Notes (Signed)
PROGRESS NOTE    Patrick Anthony  [id number] DOB: 1970/12/26 DOA: 12/22/2020 PCP: [name], MD   Brief Narrative:  The patient is a 23 atrial African-American male with a past medical history significant for but not limited to end-stage renal disease on hemodialysis on Monday Wednesday Friday, chronic diastolic CHF, hyperlipidemia, history of liver cirrhosis secondary to hepatitis C, history of alcohol abuse, OSA as well as recent AKA done by Dr. Sharol Given on 10/29/2020 and other comorbidities who presented with a chief complaint of spiking fevers for last few days and then altered mental status.  Reportedly patient was at his facility when they were noticing that he was altered and history is difficult to be obtained from the patient himself.  Of note he has had a prolonged hospitalization from 09/27/20 until November 11, 2019 he was found to have MRSA bacteremia as well as mitral valve endocarditis with PFO and septic emboli to the brain along with that time.  He underwent a bioprosthetic mitral valve replacement along with closure of the PFO on 10/14/2020.  Also during that hospitalization he was treated for osteomyelitis and abscess of the right tibia status post AKA on 10/29/2020 by Dr. Sharol Given.  He was discharged to [facility] for continuation of vancomycin at the nursing facility until 11/25/2020.  Reportedly he is usually alert and oriented x4.  Yesterday he is only alert and oriented to person and place but not the situation.  He complained about severe pain in his back has been present for some time and he describes shooting pains in the right leg but was amputated.  He denies any shortness breath or cough.  In the ED he presented with a fever of 103 and rales were reported in the lower lung field on the right lung.  Upon arrival to the ED he was noted to be septic given that he had an elevated temperature, respirations as well as being tachycardia and had a new O2 requirement as well as being altered.   CT scan of the brain showed no acute abnormalities but did show several changes that were chronic injury to the areas of infarct noted on the previous MRI.  His WBC was elevated 20,200.  Chest x-ray showed scarring in the right upper lobe, middle lobe atelectasis and a small left-sided pleural effusion stable cardiomegaly.  Initial sepsis protocol was initiated and he was given IV vancomycin and cefepime.  Further work-up reveals that he has another MRSA bacteremia and infectious diseases is involved again and nephrology is involved as well.  Patient is to get a repeat TEE and this was done today 12/24/20 to evaluate his mitral valve. Still pending to get a MRI the T-spine with and without contrast with hemodialysis after.  UnFortunately a TEE shows likely bioprosthetic valve infection so infectious disease is now going to add gentamicin and rifampin once his cultures are cleared.  We will still pursue his thoracic spine MRI which is still pending to be done.  Overnight the patient developed blurred vision and he underwent a head MRI which showed recurrent cardioembolic strokes in the setting of septic emboli from infective endocarditis.  Neurology was consulted and they did not recommend further work-up given that he recently just had an MRI of the brain with and without contrast on 09/28/2020 as well as an echo and carotid Dopplers.  Neurology recommending holding antiplatelets given the risk of hemorrhagic conversion of recurrent infective endocarditis related strokes and resume in 1 week if patient remains stable.  Patient's aspirin was then discontinued.  MRI of the spine shows early discitis-osteomyelitis at the T10-T11 level with mild perivertebral soft tissue edema at this level and no evidence of epidural fluid collection or abscess.  There is also moderate endplate marrow edema centered at C6-7 intervertebral disc space similar to previous MRI and small bilateral pleural effusion.  Cardiothoracic  surgery evaluated and they feel that he is a candidate for redo mitral valve replacement and aortic valve replacement 3 months following previous surgery as they do not feel that he would survive this.  They recommend continuing antibiotic therapy.  On the morning of 12/26/20 he went into SVT and heart rates spiked in the 150s and he was given at 250 mL bolus, tried vagal maneuvers which did not break his rhythm and he was given Tylenol for his temperature of 102.8.  Cardiology was consulted and he was given adenosine 6 mg and then it looked like 2-1 flutter and he was placed on a diltiazem drip and bolus.  Cardiology feels that he cannot be anticoagulated this time given his risk for hemorrhagic conversion.  Further review of telemetry showed that his heart rate may just represent sinus tachycardia and as there is no clear flutter waves and now cardiology transition him to oral diltiazem.  He Continues to spike temperatures and next hemodialysis session per nephrology.  Palliative care consulted and current goals of care discussion is still aggressive and full care p.o. diltiazem to 120 mg daily  Assessment & Plan:   Principal Problem:   Bacteremia due to Staphylococcus aureus Active Problems:   Anemia in chronic kidney disease   Hepatitis C   ESRD on dialysis (HCC)   S/P mitral valve replacement   S/P AKA (above knee amputation), right (HCC)   Personal history of Methicillin resistant Staphylococcus aureus infection   SIRS (systemic inflammatory response syndrome) (HCC)  Severe sepsis secondary to an MRSA bacteremia and Prostetic Valve Endocarditis present on admission -Presented the fever up to 103 F, tachycardia, tachypnea, a new oxygen requirement, altered mental status as well as a leukocytosis of 20,200 and also with hypotension -Chest x-ray on admission only noted low lung volumes and possible signs of atelectasis with no clear source of infection appreciated at that time -Blood  cultures now revealing an MRSA bacteremia; Repeat Blood Cx showed no growth to date but will obtain another set today given that he became febrile and tachycardic -He has been admitted progressive bed -He was initiated on IV antibiotics with vancomycin and cefepime but cefepime has now been discontinued -Infectious diseases has been consulted and plan further work-up for the patient and have ordered a repeat TEE and discussed with cardiology given his prostatic mitral valve and also will be getting a T-spine MRI with and without contrast with dialysis to be done afterwards; MRI done and showed that he has early changes of discitis and osteomyelitis at the T10-T11 level -Patient did get TEE which showed "Possible small vegetations associated with the bioprosthetic mitral valve and native aortic valve. No LAA thrombus. Negative for PFO. Moderate LVH Severe RA/RV dilitation with mild TR LVEF 60-65%." -We will be continuing vancomycin but because of Concern for possible Valvular Vegatations including a small, moblile Filamentous Structure on the posterior bioprosthetic vavlve leaflet was concerning for vegetation, ID now recommending PVE Coverage and will start Gentamicin and Add Rifampin once CX's Clear -procalcitonin level was 28.50 -> 28.84 -> 23.98 -CRP was 17.2 -Repeat blood cultures have been planned for 12/24/2020 -Patient continues  to have persistent sepsis physiology as he continues to spike temperatures and becomes tachycardic and tachypneic in the setting of his bacteremia -Appreciate further ID evaluation recommendations; sepsis physiology was improving but patient continues to still spike Temperatures and becomes intermittently tachycardic and tachypenic  -Patient's WBC minimally improved and went from 20.2 -> 19.7 -> 21.1 -> 28.9 -> 23.8 -> 20.4 -> 18.8 -Now infectious diseases recommending referral to cardiothoracic surgery and continuing gentamicin and vancomycin and adding rifampin later once  blood cultures remain negative; cardiothoracic surgery feels that he is not a candidate for redoing his surgical intervention -Repeat Blood Cx's from 12/26/20 POSITIVE AGAIN but the ones drawn a few days ago showed no growth to date -Infectious diseases to repeat blood cultures on 12/27/2020 showing NGTD <24 Hours so far  --Blood cultures initially positive on 5/18 and cleared on 5/20.  However repeat blood cultures on 522, 5/23 and 5/25 positive for MRSA Not a surgical candidate per T CTS. Plan: --cont IV vanc and gentamicin, per ID --f/u repeat blood cx  Acute CVAs recurrent cardioembolic strokes  in the setting of septic emboli from infective endocarditis -MRI of the brain overnight done and showed "Numerous punctate foci of abnormal diffusion restriction within both hemispheres in multiple vascular territories, consistent with acute infarcts.  Multiple scattered microhemorrhages in a nonspecific pattern." -Neurology was consulted and patient already had most of the stroke work-up done in the last few admissions and they did not recommend repeating echocardiogram as he had a TEE 520 confirming infective endocarditis -Neurology recommended holding antiplatelets at the time given risk of hemorrhagic conversion -DVT prophylaxis changed from heparin to SCDs and recommending resuming in 1 week -Neurology recommended continue telemetry monitoring and a 30-day event monitor on discharge if no arrhythmias were captured  Elevated troponin 2/2 demand ischemia -Troponin was elevated and went to 774 and likely in the setting of demand ischemia in the setting of his acute illness with bacteremia and tachyarrhythmias; he did have normal coronary arteries in 10/2020  Arrhythmia/new onset SVT -Patient was found to be in SVT this morning and tried vagal maneuvers without success He was given a beta-blocker early this morning as well with out any improvement.  He is also given 250 normal saline  bolus -Cardiology was consulted and troponins were obtained and his troponin level was 613 and trended up to 774 -Cardiology evaluated and gave the patient 6 mg of adenosine which slowed his rhythm and it looked like a 2-1 a flutter block so they started him on diltiazem drip -Once his arrhythmia improved and his heart rate slowed down cardiology reviewed his telemetry and they felt that it was not a flutter and may just represent sinus tachycardia so the will transition to oral diltiazem. -Cardiology following appreciate evaluation and Dr. Gardiner Rhyme evaluated today and patient is currently in sinus tachycardia with rates in the 100 and has been switched to p.o. diltiazem 30 mg 4 times daily and now further consolidated to diltiazem 140 mg p.o. daily -It is unclear if he did have atrial flutter but he is not a good candidate for anticoagulation given his multiple cardioembolic events from his infective endocarditis. Plan: --cont cardizem and lopressor  Hypotension, resolved -In the setting of MRSA bacteremia his admission blood pressures were as low as 84/51 but have now improved -Improved with IV fluid resuscitation -Continue to monitor blood pressures per protocol; blood pressures are still on the softer side but last blood pressure reading was 160/110 -His home blood pressure  medications include clonidine 0.2 mg p.o. twice daily as well as metoprolol tartrate 50 mg p.o. twice daily which have both been held -Goal MAP bolus of at least 65 --encourage oral hydration  HTN -His home blood pressure medications include clonidine 0.2 mg p.o. twice daily as well as metoprolol tartrate 50 mg p.o. twice daily which were held --cont cardizem and lopressor  Acute metabolic encephalopathy in the setting of infection, resolved -He is noted to be alert and oriented x2 but was previously alert and oriented x4 -CT scan did not show any acute abnormalities; he does have a prior history of mitral valve  endocarditis with septic emboli to the brain and to the lung  ESRD on Hemodialysis Monday Wednesday  Secondary hyperparathyroidism -Patient has a history of poorly cystic kidney disease --iHD per nephrology  Hyponatremia, mild --stable  Acute Respiratory Failure with Hypoxia, resolved -Was placed on 2 L supplemental oxygen via nasal cannula and does not wear supplemental oxygen at home -Chest x-ray yesterday showed "Lower lung volumes with atelectasis. Chronic cardiomegaly and right lung  Scarring." -Patient does have a history of MRSA bacteremia with septic emboli to the lungs --weaned down to RA  History of MRSA bacteremia with mitral valve endocarditis and PFO and septic emboli to the brain and left lung -He previously completed antibiotics on 11/25/2020 but now is back with another bacteremia as above so is now going to be on IV Vancomycin, IV Gentamicin, and IV Rifampin once Cx's clear   Anemia of chronic kidney disease --Hgb 9's --Monitor Hgb and transfuse to keep Hgb >7  Hypokalemia --monitor and replete with oral potassium PRN  Chronic Pain Syndrome with Chronic Opiate Dependence -He reports chronic back pain as well as right lower extremity that was recently amputated --reduce oxycontin to 10 mg q12h today  Status post right AKA -Recently had an AKA done in the last 2 months -Continue with Pregabalin 75 mg p.o. nightly  Liver cirrhosis secondary to chronic hepatitis C -His HCC quantitative was 1,750,000 on 09/29/2020 -Infectious diseases been consulted given his MRSA bacteremia again and appreciate further evaluation recommendations.  GERD -cont PPI  Hyperbilirubinemia, mild --monitor   DVT prophylaxis: lovenox Code Status: FULL CODE  Family Communication:  Disposition Plan: Pending further clinical work-up and evaluation by infectious diseases as well as nephrology, Neurology, cardiology and TCTS  Status is: Inpatient  Dispo: The patient is from: Home               Anticipated d/c is to: Home              Patient currently is not medically stable to d/c.  Bacteremia not cleared.    Difficult to place patient No  Consultants:   Infectious Diseases  Nephrology  Cardiology   Neurology  Cardiothoracic Surgery  Palliative Care   Procedures:  TEE done by Dr. Debara Pickett  Findings:  1. LEFT VENTRICLE: The left ventricular wall thickness is moderately increased.  The left ventricular cavity is normal in size. Wall motion is normal.  LVEF is 60-65%.  2. RIGHT VENTRICLE:  The right ventricle is severely dilated without any thrombus or masses.    3. LEFT ATRIUM:  The left atrium is normal in size without any thrombus or masses.  There is not spontaneous echo contrast ("smoke") in the left atrium consistent with a low flow state.  4. LEFT ATRIAL APPENDAGE:  The large left atrial appendage is free of any thrombus or masses. The appendage has single lobes.  Pulse doppler indicates high flow in the appendage.  5. ATRIAL SEPTUM:  The atrial septum appears intact and is free of thrombus and/or masses.  There is no evidence for interatrial shunting by color doppler and saline microbubble.  6. RIGHT ATRIUM:  The right atrium is severely dilated in size and function without any thrombus or masses.  7. 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 There is  trivial regurgitation.  There is a possible small mobile vegetation adherent to the posterior leaflet which appears filamentous, this could also be residual valve tissue or suture.   8. AORTIC VALVE:  The aortic valve is trileaflet, normal in structure and function with no regurgitation.  The valve was visualized with 2D/3D modes - there appears to be a small mobile density at the base of the left coronary cusp seen in the long-axis view on the outflow side of the leaflet, possibly consistent with vegetation.  9. TRICUSPID VALVE:  The tricuspid valve is normal in  structure and function with Mild regurgitation.  There were no vegetations or stenosis  10.  PULMONIC VALVE:  The pulmonic valve is normal in structure and function with trivial regurgitation.  There were no vegetations or stenosis.   11. AORTIC ARCH, ASCENDING AND DESCENDING AORTA:  There was grade 2 Ron Parker et. Al, 1992) atherosclerosis of the ascending aorta, aortic arch, or proximal descending aorta.  12. PULMONARY VEINS: Anomalous pulmonary venous return was not noted.  13. PERICARDIUM: The pericardium appeared normal and non-thickened.  There is no pericardial effusion.  IMPRESSION:   1. 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  2. No LAA thrombus 3. Negative for PFO 4. Moderate LVH 5. Severe RA/RV dilitation with mild TR 6. LVEF 60-65%  RECOMMENDATIONS:    1. Consider longer course of antibiotics for possible valvular vegetations, including a small, mobile filamentous structure on the posterior bioprosthetic valve leaflet which is concerning for vegetation.  Antimicrobials:  Anti-infectives (From admission, onward)   Start     Dose/Rate Route Frequency Ordered Stop   12/31/20 1152  vancomycin (VANCOREADY) 750 MG/150ML IVPB       Note to Pharmacy: Gaynelle Adu  : cabinet override      12/31/20 1152 12/31/20 1218   12/29/20 1400  gentamicin (GARAMYCIN) IVPB 100 mg        100 mg 200 mL/hr over 30 Minutes Intravenous Every M-W-F 12/29/20 1312     12/29/20 0932  vancomycin (VANCOREADY) 750 MG/150ML IVPB       Note to Pharmacy: Judieth Keens  : cabinet override      12/29/20 0932 12/29/20 1114   12/27/20 1800  gentamicin (GARAMYCIN) IVPB 80 mg  Status:  Discontinued        80 mg 100 mL/hr over 30 Minutes Intravenous Every M-W-F (1800) 12/24/20 1612 12/29/20 1312   12/27/20 0813  vancomycin (VANCOREADY) 750 MG/150ML IVPB  Status:  Discontinued       Note to Pharmacy: Louretta Shorten   : cabinet override       12/27/20 0813 12/27/20 0815   12/24/20 1700  gentamicin (GARAMYCIN) IVPB 120 mg        120 mg 200 mL/hr over 30 Minutes Intravenous  Once 12/24/20 1612 12/24/20 1910   12/24/20 1430  vancomycin (VANCOCIN) IVPB 750 mg/150 ml premix        750 mg 150 mL/hr over 60 Minutes Intravenous Every M-W-F (Hemodialysis) 12/24/20 1324  12/22/20 1201  vancomycin variable dose per unstable renal function (pharmacist dosing)  Status:  Discontinued         Does not apply See admin instructions 12/22/20 1202 12/24/20 1324   12/22/20 1200  vancomycin (VANCOREADY) IVPB 750 mg/150 mL  Status:  Discontinued        750 mg 150 mL/hr over 60 Minutes Intravenous Every M-W-F (Hemodialysis) 12/22/20 0845 12/22/20 1202   12/22/20 1200  ceFEPIme (MAXIPIME) 2 g in sodium chloride 0.9 % 100 mL IVPB  Status:  Discontinued        2 g 200 mL/hr over 30 Minutes Intravenous Every M-W-F (Hemodialysis) 12/22/20 0845 12/23/20 1450   12/22/20 0715  vancomycin (VANCOREADY) IVPB 1500 mg/300 mL        1,500 mg 150 mL/hr over 120 Minutes Intravenous  Once 12/22/20 0714 12/22/20 1034   12/22/20 0715  ceFEPIme (MAXIPIME) 2 g in sodium chloride 0.9 % 100 mL IVPB        2 g 200 mL/hr over 30 Minutes Intravenous  Once 12/22/20 0714 12/22/20 0806         Subjective: No pain.  No complaints.   Objective: Vitals:   01/01/21 0415 01/01/21 0745 01/01/21 1207 01/01/21 1607  BP: 96/75 98/73 110/73 96/63  Pulse: 90 97 99 92  Resp: '18 18 20 15  '$ Temp: 99.1 F (37.3 C) 98.7 F (37.1 C) 98 F (36.7 C) 97.6 F (36.4 C)  TempSrc: Axillary Oral Oral Oral  SpO2: 94% 97% 100% 98%  Weight:      Height:        Intake/Output Summary (Last 24 hours) at 01/01/2021 1615 Last data filed at 01/01/2021 1523 Gross per 24 hour  Intake 600 ml  Output --  Net 600 ml   Filed Weights   12/31/20 0925 12/31/20 1347 12/31/20 1431  Weight: 70.9 kg 70.9 kg 70.9 kg   Examination:  Constitutional: NAD, AAOx3 HEENT: conjunctivae and lids  normal, EOMI CV: RRR, No cyanosis.   RESP: normal respiratory effort, clear, on RA Extremities: No effusions, edema in BLE SKIN: warm, dry Neuro: II - XII grossly intact.   Psych: Normal mood and affect.  Appropriate judgement and reason   Data Reviewed: I have personally reviewed following labs and imaging studies  CBC: Recent Labs  Lab 12/26/20 0117 12/27/20 0218 12/28/20 0222 12/29/20 0035 12/30/20 0158 12/31/20 0100 01/01/21 0030  WBC 23.8* 20.4* 18.8* 20.6* 19.4* 22.6* 20.4*  NEUTROABS 19.2* 15.3* 14.1* 17.1*  --   --   --   HGB 9.7* 9.2* 9.6* 9.5* 9.2* 9.2* 10.3*  HCT 32.3* 30.7* 32.4* 31.0* 30.8* 30.4* 34.5*  MCV 79.2* 78.9* 80.0 79.3* 79.6* 78.8* 78.4*  PLT 315 330 337 317 330 290 [id number]   Basic Metabolic Panel: Recent Labs  Lab 12/26/20 0117 12/27/20 0218 12/28/20 0222 12/29/20 0035 12/30/20 0158 12/31/20 0100 01/01/21 0030  NA 133* 132* 134* 133*  133* 131* 131* 133*  K 3.4* 3.3* 3.6 4.1  4.0 4.0 4.6 4.1  CL 95* 95* 94* 94*  94* 94* 94* 96*  CO2 '26 22 27 26  25 25 22 25  '$ GLUCOSE 107* 96 95 93  92 83 77 86  BUN 37* 48* 25* 35*  34* 22* 38* 21*  CREATININE 5.95* 7.45* 4.94* 6.51*  6.40* 4.37* 6.47* 4.32*  CALCIUM 9.2 9.4 9.1 9.5  9.6 8.7* 9.4 9.1  MG 2.2 2.3 2.1 2.2 2.0 2.0 2.1  PHOS 2.4* 3.0 2.3* 3.7  3.7  --   --   --  GFR: Estimated Creatinine Clearance: 20.7 mL/min (A) (by C-G formula based on SCr of 4.32 mg/dL (H)). Liver Function Tests: Recent Labs  Lab 12/26/20 0117 12/27/20 0218 12/28/20 0222 12/29/20 0035  AST '25 22 25 28  '$ ALT '9 10 10 11  '$ ALKPHOS 76 71 87 74  BILITOT 1.1 1.5* 0.8 1.9*  PROT 8.4* 8.7* 8.3* 8.2*  ALBUMIN 2.2* 2.1* 2.2* 2.2*  2.1*   No results for input(s): LIPASE, AMYLASE in the last 168 hours. No results for input(s): AMMONIA in the last 168 hours. Coagulation Profile: No results for input(s): INR, PROTIME in the last 168 hours. Cardiac Enzymes: No results for input(s): CKTOTAL, CKMB, CKMBINDEX, TROPONINI  in the last 168 hours. BNP (last 3 results) No results for input(s): PROBNP in the last 8760 hours. HbA1C: No results for input(s): HGBA1C in the last 72 hours. CBG: No results for input(s): GLUCAP in the last 168 hours. Lipid Profile: No results for input(s): CHOL, HDL, LDLCALC, TRIG, CHOLHDL, LDLDIRECT in the last 72 hours. Thyroid Function Tests: No results for input(s): TSH, T4TOTAL, FREET4, T3FREE, THYROIDAB in the last 72 hours. Anemia Panel: No results for input(s): VITAMINB12, FOLATE, FERRITIN, TIBC, IRON, RETICCTPCT in the last 72 hours. Sepsis Labs: Recent Labs  Lab 12/26/20 1019 12/27/20 0218 12/28/20 0222  PROCALCITON 28.50 28.84 23.98    Recent Results (from the past 240 hour(s))  Culture, blood (routine x 2)     Status: None   Collection Time: 12/24/20  3:10 PM   Specimen: BLOOD RIGHT HAND  Result Value Ref Range Status   Specimen Description BLOOD RIGHT HAND  Final   Special Requests   Final    BOTTLES DRAWN AEROBIC ONLY Blood Culture adequate volume   Culture   Final    NO GROWTH 5 DAYS Performed at [facility], [address] [place], [place] [address]    Report Status 12/29/2020 FINAL  Final  Culture, blood (routine x 2)     Status: None   Collection Time: 12/24/20  3:37 PM   Specimen: BLOOD  Result Value Ref Range Status   Specimen Description BLOOD BLOOD RIGHT FOREARM  Final   Special Requests   Final    BOTTLES DRAWN AEROBIC ONLY Blood Culture adequate volume   Culture   Final    NO GROWTH 5 DAYS Performed at [facility], [place] [address], [place], [place] [address]    Report Status 12/29/2020 FINAL  Final  MRSA PCR Screening     Status: None   Collection Time: 12/25/20 10:18 PM   Specimen: Nasal Mucosa; Nasopharyngeal  Result Value Ref Range Status   MRSA by PCR NEGATIVE NEGATIVE Final    Comment:        The GeneXpert MRSA Assay (FDA approved for NASAL specimens only), is one component of a comprehensive MRSA  colonization surveillance program. It is not intended to diagnose MRSA infection nor to guide or monitor treatment for MRSA infections. Performed at [facility], [place] [address] [place], [place] [address]   Culture, blood (routine x 2)     Status: Abnormal   Collection Time: 12/26/20 10:19 AM   Specimen: BLOOD RIGHT HAND  Result Value Ref Range Status   Specimen Description BLOOD RIGHT HAND  Final   Special Requests   Final    BOTTLES DRAWN AEROBIC ONLY Blood Culture results may not be optimal due to an inadequate volume of blood received in culture bottles   Culture  Setup  Time   Final    GRAM POSITIVE COCCI IN CLUSTERS AEROBIC BOTTLE ONLY CRITICAL RESULT CALLED TO, READ BACK BY AND VERIFIED WITH: PHARMD TM TUCKER [id number] AT 57 BY CM    Culture (A)  Final    STAPHYLOCOCCUS AUREUS SUSCEPTIBILITIES PERFORMED ON PREVIOUS CULTURE WITHIN THE LAST 5 DAYS. Performed at [place] [facility], [place] [address], [place], [place] [address]    Report Status 12/29/2020 FINAL  Final  Culture, blood (routine x 2)     Status: Abnormal (Preliminary result)   Collection Time: 12/26/20 10:19 AM   Specimen: BLOOD RIGHT WRIST  Result Value Ref Range Status   Specimen Description BLOOD RIGHT WRIST  Final   Special Requests   Final    BOTTLES DRAWN AEROBIC ONLY Blood Culture results may not be optimal due to an inadequate volume of blood received in culture bottles   Culture  Setup Time   Final    GRAM POSITIVE COCCI IN CLUSTERS AEROBIC BOTTLE ONLY CRITICAL RESULT CALLED TO, READ BACK BY AND VERIFIED WITH: PHARMD M TUCKER [id number] AT 1 BY CM    Culture (A)  Final    METHICILLIN RESISTANT STAPHYLOCOCCUS AUREUS Sent to [facility] for further susceptibility testing. Performed at [facility], [place] [address] [place], [place] [address]    Report Status PENDING  Incomplete   Organism ID, Bacteria METHICILLIN RESISTANT STAPHYLOCOCCUS AUREUS  Final      Susceptibility   Methicillin resistant  staphylococcus aureus - MIC*    CIPROFLOXACIN >=8 RESISTANT Resistant     ERYTHROMYCIN >=8 RESISTANT Resistant     GENTAMICIN <=0.5 SENSITIVE Sensitive     OXACILLIN >=4 RESISTANT Resistant     TETRACYCLINE <=1 SENSITIVE Sensitive     VANCOMYCIN 1 SENSITIVE Sensitive     TRIMETH/SULFA <=10 SENSITIVE Sensitive     CLINDAMYCIN <=0.25 SENSITIVE Sensitive     RIFAMPIN <=0.5 SENSITIVE Sensitive     Inducible Clindamycin NEGATIVE Sensitive     * METHICILLIN RESISTANT STAPHYLOCOCCUS AUREUS  Culture, blood (routine x 2)     Status: Abnormal (Preliminary result)   Collection Time: 12/27/20  2:17 PM   Specimen: BLOOD  Result Value Ref Range Status   Specimen Description BLOOD THUMB  Final   Special Requests   Final    BOTTLES DRAWN AEROBIC AND ANAEROBIC Blood Culture adequate volume   Culture  Setup Time   Final    GRAM POSITIVE COCCI ANAEROBIC BOTTLE ONLY CRITICAL VALUE NOTED.  VALUE IS CONSISTENT WITH PREVIOUSLY REPORTED AND CALLED VALUE.    Culture (A)  Final    STAPHYLOCOCCUS AUREUS SUSCEPTIBILITIES PERFORMED ON PREVIOUS CULTURE WITHIN THE LAST 5 DAYS. Performed at [facility], [place] [address], [place], [place] [address]    Report Status PENDING  Incomplete  Culture, blood (routine x 2)     Status: Abnormal   Collection Time: 12/27/20  2:17 PM   Specimen: BLOOD RIGHT HAND  Result Value Ref Range Status   Specimen Description BLOOD RIGHT HAND  Final   Special Requests   Final    BOTTLES DRAWN AEROBIC AND ANAEROBIC Blood Culture adequate volume   Culture  Setup Time   Final    GRAM POSITIVE COCCI IN CLUSTERS ANAEROBIC BOTTLE ONLY CRITICAL VALUE NOTED.  VALUE IS CONSISTENT WITH PREVIOUSLY REPORTED AND CALLED VALUE. Performed at [facility], [place] [address] [place], [place] [address]    Culture METHICILLIN RESISTANT STAPHYLOCOCCUS AUREUS (A)  Final   Report Status 12/31/2020  FINAL  Final   Organism ID, Bacteria METHICILLIN RESISTANT STAPHYLOCOCCUS AUREUS  Final       Susceptibility   Methicillin resistant staphylococcus aureus - MIC*    CIPROFLOXACIN >=8 RESISTANT Resistant     ERYTHROMYCIN >=8 RESISTANT Resistant     GENTAMICIN <=0.5 SENSITIVE Sensitive     OXACILLIN >=4 RESISTANT Resistant     TETRACYCLINE <=1 SENSITIVE Sensitive     VANCOMYCIN <=0.5 SENSITIVE Sensitive     TRIMETH/SULFA <=10 SENSITIVE Sensitive     CLINDAMYCIN <=0.25 SENSITIVE Sensitive     RIFAMPIN <=0.5 SENSITIVE Sensitive     Inducible Clindamycin NEGATIVE Sensitive     * METHICILLIN RESISTANT STAPHYLOCOCCUS AUREUS  Culture, blood (routine x 2)     Status: Abnormal   Collection Time: 12/29/20  1:43 PM   Specimen: BLOOD RIGHT HAND  Result Value Ref Range Status   Specimen Description BLOOD RIGHT HAND  Final   Special Requests   Final    BOTTLES DRAWN AEROBIC AND ANAEROBIC Blood Culture adequate volume   Culture  Setup Time   Final    GRAM POSITIVE COCCI ANAEROBIC BOTTLE ONLY CRITICAL VALUE NOTED.  VALUE IS CONSISTENT WITH PREVIOUSLY REPORTED AND CALLED VALUE.    Culture (A)  Final    STAPHYLOCOCCUS AUREUS SUSCEPTIBILITIES PERFORMED ON PREVIOUS CULTURE WITHIN THE LAST 5 DAYS. Performed at [facility], [place] [address] [place], [place] [address]    Report Status 01/01/2021 FINAL  Final  Culture, blood (routine x 2)     Status: None (Preliminary result)   Collection Time: 12/29/20  1:43 PM   Specimen: BLOOD RIGHT HAND  Result Value Ref Range Status   Specimen Description BLOOD RIGHT HAND  Final   Special Requests   Final    BOTTLES DRAWN AEROBIC AND ANAEROBIC Blood Culture adequate volume   Culture  Setup Time   Final    GRAM POSITIVE COCCI IN CLUSTERS ANAEROBIC BOTTLE ONLY CRITICAL VALUE NOTED.  VALUE IS CONSISTENT WITH PREVIOUSLY REPORTED AND CALLED VALUE. Performed at [facility], [place] [address] [place], [place] [address]    Culture GRAM POSITIVE COCCI  Final   Report Status PENDING  Incomplete  Culture, blood (routine x 2)     Status: None  (Preliminary result)   Collection Time: 12/31/20  8:48 AM   Specimen: BLOOD RIGHT HAND  Result Value Ref Range Status   Specimen Description BLOOD RIGHT HAND  Final   Special Requests   Final    BOTTLES DRAWN AEROBIC ONLY Blood Culture results may not be optimal due to an excessive volume of blood received in culture bottles   Culture   Final    NO GROWTH < 24 HOURS Performed at [facility], [place] [address], [place], [place] [address]    Report Status PENDING  Incomplete  Culture, blood (routine x 2)     Status: None (Preliminary result)   Collection Time: 12/31/20  8:48 AM   Specimen: BLOOD RIGHT FOREARM  Result Value Ref Range Status   Specimen Description BLOOD RIGHT FOREARM  Final   Special Requests   Final    BOTTLES DRAWN AEROBIC ONLY Blood Culture adequate volume   Culture   Final    NO GROWTH < 24 HOURS Performed at [facility], [place] [address], [place], [place] [address]    Report Status PENDING  Incomplete    RN Pressure Injury Documentation:     Estimated body mass index is 21.2  kg/m as calculated from the following:   Height as of this encounter: 6' (1.829 m).   Weight as of this encounter: 70.9 kg.  Malnutrition Type:   Malnutrition Characteristics:   Nutrition Interventions:   Radiology Studies: DG Hand 2 View Right  Result Date: 12/31/2020 CLINICAL DATA:  Pain at first digit EXAM: RIGHT HAND - 2 VIEW COMPARISON:  None. FINDINGS: Frontal and lateral views of the right hand are obtained. No fracture, subluxation, or dislocation. There is mild joint space narrowing at the first metacarpophalangeal joint, first interphalangeal joint, and throughout the remaining distal interphalangeal joints consistent with osteoarthritis. Vascular calcifications are seen throughout the hand and wrist. Soft tissues are otherwise unremarkable. IMPRESSION: 1. 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 2. No acute bony abnormality. Electronically Signed   By:  Randa Ngo M.D.   On: 12/31/2020 19:27   Scheduled Meds: . (feeding supplement) PROSource Plus  30 mL Oral BID BM  . aspirin EC  325 mg Oral Daily  . Chlorhexidine Gluconate Cloth  6 each Topical Q0600  . cinacalcet  60 mg Oral Q supper  . diltiazem  120 mg Oral Daily  . docusate sodium  100 mg Oral BID  . enoxaparin (LOVENOX) injection  30 mg Subcutaneous Q24H  . feeding supplement (NEPRO CARB STEADY)  237 mL Oral TID BM  . methocarbamol  500 mg Oral TID  . metoprolol tartrate  50 mg Oral BID  . multivitamin with minerals  1 tablet Oral Daily  . oxyCODONE  15 mg Oral Q12H  . pantoprazole  40 mg Oral Daily  . pregabalin  75 mg Oral QHS  . senna-docusate  1 tablet Oral BID  . sodium chloride flush  3 mL Intravenous Q12H   Continuous Infusions: . gentamicin 100 mg (12/31/20 1446)  . vancomycin Stopped (12/27/20 1214)    LOS: 10 days   Enzo Bi, md [facility] PAGER is on [place]  If 7PM-7AM, please contact night-coverage [email or web address]

## 2021-01-01 NOTE — Progress Notes (Signed)
[facility] Progress Note   Dialysis Orders: MWF -[facility] 4hrs, [id number], DFR500, EDW 72kg,2K/2CaProfile 2  Access:LU AVG vs BCF Heparin5500 unit qHD Mircera268mg q2wks - last 5/9 Venofer '100mg'$  qHD x5 - 1 completed  Assessment/ Plan:   1. 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  2. 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 WBCs 28 -> 20.4. On  Vancomycin and Gent per pharmacy. s/p repeat TEE with MV vegetations.  MRI thoracic spine -> concerning for osteo/discitis T10/11.  Per primary team. ID following.  3. Back pain -MRI lumbar spine with no discitis/osteomyelitis, +degenerative disc disease at L4-L5, L5-S1. ID plans for MRI T spine. 4. Hypoxia-resolved but req O2 [place] at night.  5. ESRD- On HD MWF. Continue on schedule.   HD 5/23 net UF 3.5L, 5/25 3L, 5/27 4L  Next HD Monday; will have him 1st shift. Counseled on watching fluids so we can avoid hd this weekend.   6. Hypertension/volume-BP elevated. Resume home meds. Under dry weight if weights correct. UF has been limited by hypotension. Tolerating UF on HD fortunately. 7. Anemiaof CKD- Hgb 9.7- Next ESA dose due 5/23. .Marland KitchenHold IV iron due to acute infection. 8. Secondary Hyperparathyroidism -Ca in goal.Phos low - binders stopped -> phos 3 5/23. Continue sensipar '60mg'$  qd. Not on VDRA. 9. Nutrition- Renal diet w/fluid restriction. Protein supplement.   Subjective:   No new events and appears much more comfortable this am.  Denies subjective fevers overnight or chills. On review of chart TMax 99.3 and not as persistently tachy.   Objective:   BP 96/75 (BP Location: Right Wrist)   Pulse 90   Temp 99.1 F (37.3 C) (Axillary)   Resp 18   Ht 6' (1.829 m)   Wt 70.9 kg   SpO2 94%   BMI 21.20 kg/m   Intake/Output Summary (Last 24 hours) at  01/01/2021 [id number] data filed at 12/31/2020 1347 Gross per 24 hour  Intake 120 ml  Output 4015 ml  Net -3895 ml   Weight change: 0.7 kg  Physical Exam: General: alert, oriented x3, male patient in NAD, sitting on side of  bed Heart: RRR Lungs: clear bilaterally, occasional rhonchi  Abdomen:soft, NTND Extremities: R AKA, no edema Dialysis Access: LU BCF w/ bypass graft strong bruit w bandages  Imaging: DG Hand 2 View Right  Result Date: 12/31/2020 CLINICAL DATA:  Pain at first digit EXAM: RIGHT HAND - 2 VIEW COMPARISON:  None. FINDINGS: Frontal and lateral views of the right hand are obtained. No fracture, subluxation, or dislocation. There is mild joint space narrowing at the first metacarpophalangeal joint, first interphalangeal joint, and throughout the remaining distal interphalangeal joints consistent with osteoarthritis. Vascular calcifications are seen throughout the hand and wrist. Soft tissues are otherwise unremarkable. IMPRESSION: 1. 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 2. No acute bony abnormality. Electronically Signed   By: MRanda NgoM.D.   On: 12/31/2020 19:27    Labs: BMET Recent Labs  Lab 12/26/20 0117 12/27/20 0218 12/28/20 0222 12/29/20 0035 12/30/20 0158 12/31/20 0100 01/01/21 0030  NA 133* 132* 134* 133*  133* 131* 131* 133*  K 3.4* 3.3* 3.6 4.1  4.0 4.0 4.6 4.1  CL 95* 95* 94* 94*  94* 94* 94* 96*  CO2 '26 22 27 26  25 25 22 25  '$ GLUCOSE 107* 96 95 93  92 83 77 86  BUN 37* 48* 25* 35*  34* 22* 38* 21*  CREATININE 5.95* 7.45* 4.94* 6.51*  6.40* 4.37* 6.47* 4.32*  CALCIUM 9.2 9.4 9.1 9.5  9.6 8.7* 9.4 9.1  PHOS 2.4* 3.0 2.3* 3.7  3.7  --   --   --    CBC Recent Labs  Lab 12/26/20 0117 12/27/20 0218 12/28/20 0222 12/29/20 0035 12/30/20 0158 12/31/20 0100 01/01/21 0030  WBC 23.8* 20.4* 18.8* 20.6* 19.4* 22.6* 20.4*  NEUTROABS 19.2* 15.3* 14.1* 17.1*  --   --   --   HGB 9.7* 9.2* 9.6* 9.5* 9.2* 9.2* 10.3*  HCT 32.3*  30.7* 32.4* 31.0* 30.8* 30.4* 34.5*  MCV 79.2* 78.9* 80.0 79.3* 79.6* 78.8* 78.4*  PLT 315 330 337 317 330 290 338    Medications:    . (feeding supplement) PROSource Plus  30 mL Oral BID BM  . aspirin EC  325 mg Oral Daily  . Chlorhexidine Gluconate Cloth  6 each Topical Q0600  . cinacalcet  60 mg Oral Q supper  . diltiazem  120 mg Oral Daily  . docusate sodium  100 mg Oral BID  . enoxaparin (LOVENOX) injection  30 mg Subcutaneous Q24H  . feeding supplement (NEPRO CARB STEADY)  237 mL Oral TID BM  . methocarbamol  500 mg Oral TID  . metoprolol tartrate  50 mg Oral BID  . multivitamin with minerals  1 tablet Oral Daily  . oxyCODONE  15 mg Oral Q12H  . pantoprazole  40 mg Oral Daily  . pregabalin  75 mg Oral QHS  . senna-docusate  1 tablet Oral BID  . sodium chloride flush  3 mL Intravenous Q12H      Otelia Santee, MD 01/01/2021, 7:05 AM

## 2021-01-01 NOTE — Progress Notes (Addendum)
Pharmacy Antibiotic Note  Valton Happ is a 50 y.o. male admitted on 12/22/2020 with MRSA bacteremia. He was found to have possible small vegetations associated with the bioprosthetic mitral valve and native aortic valve on TEE.  Pharmacy has been consulted for gentamicin and vancomycin dosing for prosthetic valve endocarditis. Patient is a HD patient with sessions on MWF. Last HD was 5/27. Per nephro, next scheduled HD session is 1st shift on Monday 5/30.  Persistent bacteremia with repeat cultures on 5/23 positive again. On 5/25,  pre-HD vanc level came back at 24 (within goal) and pre-HD gentamicin came back as 2.7 (goal 3-4). Dose of gent was increased to 100 mg every MWF after HD. Bcx drawn 5/25 are ngtd.   Plan: Continue gent 100 mg every MWF after HD  Goal pre-HD level 3-4 Continue vancomycin 750 mg every MWF with HD  Monitor blood cultures and HD schedule  Plan for pre-HD levels prior to HD next week (5/30)   Will also plan to add rifampin once blood cultures clear  Height: 6' (182.9 cm) Weight: 70.9 kg (156 lb 4.9 oz) IBW/kg (Calculated) : 77.6  Temp (24hrs), Avg:98.9 F (37.2 C), Min:98.6 F (37 C), Max:99.3 F (37.4 C)  Recent Labs  Lab 12/28/20 0222 12/29/20 0035 12/30/20 0158 12/31/20 0100 01/01/21 0030  WBC 18.8* 20.6* 19.4* 22.6* 20.4*  CREATININE 4.94* 6.51*  6.40* 4.37* 6.47* 4.32*  VANCORANDOM  --  24  --   --   --   GENTRANDOM  --  2.7  --   --   --     Estimated Creatinine Clearance: 20.7 mL/min (A) (by C-G formula based on SCr of 4.32 mg/dL (H)).    No Known Allergies  Cephus Slater, PharmD, [facility] Pharmacy Resident [phone] 01/01/2021 8:53 AM

## 2021-01-01 NOTE — Progress Notes (Signed)
[facility] for Infectious [facility]    Date of Admission:  12/22/2020      ID: Patrick Anthony is a 50 y.o. male with MRSA bacteremia. He was found to have possible small vegetations associated with the bioprosthetic mitral valve and native aortic valve on TEE on vancomycin and gent since 5/20 Principal Problem:   Bacteremia due to Staphylococcus aureus Active Problems:   Anemia in chronic kidney disease   Hepatitis C   ESRD on dialysis ([place])   S/P mitral valve replacement   S/P AKA (above knee amputation), right (HCC)   Personal history of Methicillin resistant Staphylococcus aureus infection   SIRS (systemic inflammatory response syndrome) (HCC)    Subjective: Tolerating abtx. No fever or chills last night. No difficulty with HD yesterday  [place]: Review of Systems  Constitutional: Negative for fever, chills, diaphoresis, activity change, appetite change, fatigue and unexpected weight change.  HENT: Negative for congestion, sore throat, rhinorrhea, sneezing, trouble swallowing and sinus pressure.  Eyes: Negative for photophobia and visual disturbance.  Respiratory: Negative for cough, chest tightness, shortness of breath, wheezing and stridor.  Cardiovascular: Negative for chest pain, palpitations and leg swelling.  Gastrointestinal: Negative for nausea, vomiting, abdominal pain, diarrhea, constipation, blood in stool, abdominal distention and anal bleeding.  Genitourinary: Negative for dysuria, hematuria, flank pain and difficulty urinating.  Musculoskeletal: Negative for myalgias, back pain, joint swelling, arthralgias and gait problem.  Skin: Negative for color change, pallor, rash and wound.  Neurological: Negative for dizziness, tremors, weakness and light-headedness.  Hematological: Negative for adenopathy. Does not bruise/bleed easily.  Psychiatric/Behavioral: Negative for behavioral problems, confusion, sleep disturbance, dysphoric mood, decreased concentration and  agitation.     Medications:  . (feeding supplement) PROSource Plus  30 mL Oral BID BM  . aspirin EC  325 mg Oral Daily  . Chlorhexidine Gluconate Cloth  6 each Topical Q0600  . cinacalcet  60 mg Oral Q supper  . diltiazem  120 mg Oral Daily  . docusate sodium  100 mg Oral BID  . enoxaparin (LOVENOX) injection  30 mg Subcutaneous Q24H  . feeding supplement (NEPRO CARB STEADY)  237 mL Oral TID BM  . methocarbamol  500 mg Oral TID  . metoprolol tartrate  50 mg Oral BID  . multivitamin with minerals  1 tablet Oral Daily  . oxyCODONE  15 mg Oral Q12H  . pantoprazole  40 mg Oral Daily  . pregabalin  75 mg Oral QHS  . senna-docusate  1 tablet Oral BID  . sodium chloride flush  3 mL Intravenous Q12H    Objective: Vital signs in last 24 hours: Temp:  [98 F (36.7 C)-99.3 F (37.4 C)] 98 F (36.7 C) (05/28 1207) Pulse Rate:  [90-121] 99 (05/28 1207) Resp:  [14-20] 20 (05/28 1207) BP: (96-113)/(73-81) 110/73 (05/28 1207) SpO2:  [94 %-100 %] 100 % (05/28 1207) Physical Exam  Constitutional: He is oriented to person, place, and time. He appears well-developed and well-nourished. No distress.  HENT:  Mouth/Throat: Oropharynx is clear and moist. No oropharyngeal exudate.  Cardiovascular: Normal rate, regular rhythm and normal heart sounds. Exam reveals no gallop and no friction rub.  No murmur heard.  Pulmonary/Chest: Effort normal and breath sounds normal. No respiratory distress. He has no wheezes.  Abdominal: Soft. Bowel sounds are normal. He exhibits no distension. There is no tenderness.  Ext: lue fistula +thrill Neurological: He is alert and oriented to person, place, and time.  [id number] AKA Skin: Skin is warm and  dry. No rash noted. No erythema.  Psychiatric: He has a normal mood and affect. His behavior is normal.     Lab Results Recent Labs    12/31/20 0100 01/01/21 0030  WBC 22.6* 20.4*  HGB 9.2* 10.3*  HCT 30.4* 34.5*  NA 131* 133*  K 4.6 4.1  CL 94* 96*  CO2  22 25  BUN 38* 21*  CREATININE 6.47* 4.32*    Microbiology: 5/27 blood cx NGTD at 24hr Studies/Results: DG Hand 2 View Right  Result Date: 12/31/2020 CLINICAL DATA:  Pain at first digit EXAM: RIGHT HAND - 2 VIEW COMPARISON:  None. FINDINGS: Frontal and lateral views of the right hand are obtained. No fracture, subluxation, or dislocation. There is mild joint space narrowing at the first metacarpophalangeal joint, first interphalangeal joint, and throughout the remaining distal interphalangeal joints consistent with osteoarthritis. Vascular calcifications are seen throughout the hand and wrist. Soft tissues are otherwise unremarkable. IMPRESSION: 1. 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 2. No acute bony abnormality. Electronically Signed   By: Randa Ngo M.D.   On: 12/31/2020 19:27     Assessment/Plan: MSSA bioprosthetic valve endocarditis = continue on vancomycin and gentamicin. Will add rifampin tomorrow if blood cx still remain negative Plan for 6 wk using first day of bacteremia clearance - 5/27.    Therapeutic drug monitoring = check gent trough early next week.  Right thumb = xray showing OA. Otherwise improved. ? [place] node could be part of differential for what started to hurt?    [facility] for Infectious Diseases Cell: [phone] Pager: [phone]  01/01/2021, 3:44 PM

## 2021-01-02 DIAGNOSIS — N186 End stage renal disease: Secondary | ICD-10-CM | POA: Diagnosis not present

## 2021-01-02 LAB — CULTURE, BLOOD (ROUTINE X 2): Special Requests: ADEQUATE

## 2021-01-02 LAB — MISC LABCORP TEST (SEND OUT): Labcorp test code: 88013

## 2021-01-02 NOTE — Progress Notes (Signed)
PROGRESS NOTE    Patrick Anthony  [id number] DOB: June 04, 1971 DOA: 12/22/2020 PCP: [name], MD   Brief Narrative:  The patient is a 68 atrial African-American male with a past medical history significant for but not limited to end-stage renal disease on hemodialysis on Monday Wednesday Friday, chronic diastolic CHF, hyperlipidemia, history of liver cirrhosis secondary to hepatitis C, history of alcohol abuse, OSA as well as recent AKA done by Dr. Sharol Given on 10/29/2020 and other comorbidities who presented with a chief complaint of spiking fevers for last few days and then altered mental status.  Reportedly patient was at his facility when they were noticing that he was altered and history is difficult to be obtained from the patient himself.  Of note he has had a prolonged hospitalization from 09/27/20 until November 11, 2019 he was found to have MRSA bacteremia as well as mitral valve endocarditis with PFO and septic emboli to the brain along with that time.  He underwent a bioprosthetic mitral valve replacement along with closure of the PFO on 10/14/2020.  Also during that hospitalization he was treated for osteomyelitis and abscess of the right tibia status post AKA on 10/29/2020 by Dr. Sharol Given.  He was discharged to [facility] for continuation of vancomycin at the nursing facility until 11/25/2020.  Reportedly he is usually alert and oriented x4.  Yesterday he is only alert and oriented to person and place but not the situation.  He complained about severe pain in his back has been present for some time and he describes shooting pains in the right leg but was amputated.  He denies any shortness breath or cough.  In the ED he presented with a fever of 103 and rales were reported in the lower lung field on the right lung.  Upon arrival to the ED he was noted to be septic given that he had an elevated temperature, respirations as well as being tachycardia and had a new O2 requirement as well as being altered.   CT scan of the brain showed no acute abnormalities but did show several changes that were chronic injury to the areas of infarct noted on the previous MRI.  His WBC was elevated 20,200.  Chest x-ray showed scarring in the right upper lobe, middle lobe atelectasis and a small left-sided pleural effusion stable cardiomegaly.  Initial sepsis protocol was initiated and he was given IV vancomycin and cefepime.  Further work-up reveals that he has another MRSA bacteremia and infectious diseases is involved again and nephrology is involved as well.  Patient is to get a repeat TEE and this was done today 12/24/20 to evaluate his mitral valve. Still pending to get a MRI the T-spine with and without contrast with hemodialysis after.  UnFortunately a TEE shows likely bioprosthetic valve infection so infectious disease is now going to add gentamicin and rifampin once his cultures are cleared.  We will still pursue his thoracic spine MRI which is still pending to be done.  Overnight the patient developed blurred vision and he underwent a head MRI which showed recurrent cardioembolic strokes in the setting of septic emboli from infective endocarditis.  Neurology was consulted and they did not recommend further work-up given that he recently just had an MRI of the brain with and without contrast on 09/28/2020 as well as an echo and carotid Dopplers.  Neurology recommending holding antiplatelets given the risk of hemorrhagic conversion of recurrent infective endocarditis related strokes and resume in 1 week if patient remains stable.  Patient's aspirin was then discontinued.  MRI of the spine shows early discitis-osteomyelitis at the T10-T11 level with mild perivertebral soft tissue edema at this level and no evidence of epidural fluid collection or abscess.  There is also moderate endplate marrow edema centered at C6-7 intervertebral disc space similar to previous MRI and small bilateral pleural effusion.  Cardiothoracic  surgery evaluated and they feel that he is a candidate for redo mitral valve replacement and aortic valve replacement 3 months following previous surgery as they do not feel that he would survive this.  They recommend continuing antibiotic therapy.  On the morning of 12/26/20 he went into SVT and heart rates spiked in the 150s and he was given at 250 mL bolus, tried vagal maneuvers which did not break his rhythm and he was given Tylenol for his temperature of 102.8.  Cardiology was consulted and he was given adenosine 6 mg and then it looked like 2-1 flutter and he was placed on a diltiazem drip and bolus.  Cardiology feels that he cannot be anticoagulated this time given his risk for hemorrhagic conversion.  Further review of telemetry showed that his heart rate may just represent sinus tachycardia and as there is no clear flutter waves and now cardiology transition him to oral diltiazem.  He Continues to spike temperatures and next hemodialysis session per nephrology.  Palliative care consulted and current goals of care discussion is still aggressive and full care p.o. diltiazem to 120 mg daily  Assessment & Plan:   Principal Problem:   Bacteremia due to Staphylococcus aureus Active Problems:   Anemia in chronic kidney disease   Hepatitis C   ESRD on dialysis (HCC)   S/P mitral valve replacement   S/P AKA (above knee amputation), right (HCC)   Personal history of Methicillin resistant Staphylococcus aureus infection   SIRS (systemic inflammatory response syndrome) (HCC)  Severe sepsis secondary to an MRSA bacteremia and Prostetic Valve Endocarditis present on admission -Presented the fever up to 103 F, tachycardia, tachypnea, a new oxygen requirement, altered mental status as well as a leukocytosis of 20,200 and also with hypotension -Chest x-ray on admission only noted low lung volumes and possible signs of atelectasis with no clear source of infection appreciated at that time -Blood  cultures now revealing an MRSA bacteremia; Repeat Blood Cx showed no growth to date but will obtain another set today given that he became febrile and tachycardic -He has been admitted progressive bed -He was initiated on IV antibiotics with vancomycin and cefepime but cefepime has now been discontinued -Infectious diseases has been consulted and plan further work-up for the patient and have ordered a repeat TEE and discussed with cardiology given his prostatic mitral valve and also will be getting a T-spine MRI with and without contrast with dialysis to be done afterwards; MRI done and showed that he has early changes of discitis and osteomyelitis at the T10-T11 level -Patient did get TEE which showed "Possible small vegetations associated with the bioprosthetic mitral valve and native aortic valve. No LAA thrombus. Negative for PFO. Moderate LVH Severe RA/RV dilitation with mild TR LVEF 60-65%." -We will be continuing vancomycin but because of Concern for possible Valvular Vegatations including a small, moblile Filamentous Structure on the posterior bioprosthetic vavlve leaflet was concerning for vegetation, ID now recommending PVE Coverage and will start Gentamicin and Add Rifampin once CX's Clear -procalcitonin level was 28.50 -> 28.84 -> 23.98 -CRP was 17.2 -Repeat blood cultures have been planned for 12/24/2020 -Patient continues  to have persistent sepsis physiology as he continues to spike temperatures and becomes tachycardic and tachypneic in the setting of his bacteremia -Appreciate further ID evaluation recommendations; sepsis physiology was improving but patient continues to still spike Temperatures and becomes intermittently tachycardic and tachypenic  -Patient's WBC minimally improved and went from 20.2 -> 19.7 -> 21.1 -> 28.9 -> 23.8 -> 20.4 -> 18.8 -Now infectious diseases recommending referral to cardiothoracic surgery and continuing gentamicin and vancomycin and adding rifampin later once  blood cultures remain negative; cardiothoracic surgery feels that he is not a candidate for redoing his surgical intervention -Repeat Blood Cx's from 12/26/20 POSITIVE AGAIN but the ones drawn a few days ago showed no growth to date -Infectious diseases to repeat blood cultures on 12/27/2020 showing NGTD <24 Hours so far  --Blood cultures initially positive on 5/18 and cleared on 5/20.  However repeat blood cultures on 522, 5/23 and 5/25 positive for MRSA Not a surgical candidate per T CTS. Plan: --cont IV vanc and gentamicin, per ID --f/u repeat blood cx  Acute CVAs recurrent cardioembolic strokes  in the setting of septic emboli from infective endocarditis -MRI of the brain overnight done and showed "Numerous punctate foci of abnormal diffusion restriction within both hemispheres in multiple vascular territories, consistent with acute infarcts.  Multiple scattered microhemorrhages in a nonspecific pattern." -Neurology was consulted and patient already had most of the stroke work-up done in the last few admissions and they did not recommend repeating echocardiogram as he had a TEE 520 confirming infective endocarditis -Neurology recommended holding antiplatelets at the time given risk of hemorrhagic conversion -DVT prophylaxis changed from heparin to SCDs and recommending resuming in 1 week -Neurology recommended continue telemetry monitoring and a 30-day event monitor on discharge if no arrhythmias were captured --resume home ASA 325 and DVT ppx  Elevated troponin 2/2 demand ischemia -Troponin was elevated and went to 774 and likely in the setting of demand ischemia in the setting of his acute illness with bacteremia and tachyarrhythmias; he did have normal coronary arteries in 10/2020  Arrhythmia/new onset SVT -Patient was found to be in SVT this morning and tried vagal maneuvers without success He was given a beta-blocker early this morning as well with out any improvement.  He is also given  250 normal saline bolus -Cardiology was consulted and troponins were obtained and his troponin level was 613 and trended up to 774 -Cardiology evaluated and gave the patient 6 mg of adenosine which slowed his rhythm and it looked like a 2-1 a flutter block so they started him on diltiazem drip -Once his arrhythmia improved and his heart rate slowed down cardiology reviewed his telemetry and they felt that it was not a flutter and may just represent sinus tachycardia so the will transition to oral diltiazem. -Cardiology following appreciate evaluation and Dr. Gardiner Rhyme evaluated today and patient is currently in sinus tachycardia with rates in the 100 and has been switched to p.o. diltiazem 30 mg 4 times daily and now further consolidated to diltiazem 140 mg p.o. daily -It is unclear if he did have atrial flutter but he is not a good candidate for anticoagulation given his multiple cardioembolic events from his infective endocarditis. Plan: --cont cardizem and lopressor  Hypotension, resolved -In the setting of MRSA bacteremia his admission blood pressures were as low as 84/51 but have now improved -Improved with IV fluid resuscitation -Continue to monitor blood pressures per protocol; blood pressures are still on the softer side but last blood pressure  reading was 160/110 -His home blood pressure medications include clonidine 0.2 mg p.o. twice daily as well as metoprolol tartrate 50 mg p.o. twice daily which have both been held -Goal MAP bolus of at least 65 --encourage oral hydration  HTN -His home blood pressure medications include clonidine 0.2 mg p.o. twice daily as well as metoprolol tartrate 50 mg p.o. twice daily which were held --cont cardizem and lopressor  Acute metabolic encephalopathy in the setting of infection, resolved -He is noted to be alert and oriented x2 but was previously alert and oriented x4 -CT scan did not show any acute abnormalities; he does have a prior history of  mitral valve endocarditis with septic emboli to the brain and to the lung  ESRD on Hemodialysis Monday Wednesday  Secondary hyperparathyroidism -Patient has a history of poorly cystic kidney disease --iHD per nephrology  Hyponatremia, mild --stable  Acute Respiratory Failure with Hypoxia, resolved -Was placed on 2 L supplemental oxygen via nasal cannula and does not wear supplemental oxygen at home -Chest x-ray yesterday showed "Lower lung volumes with atelectasis. Chronic cardiomegaly and right lung  Scarring." -Patient does have a history of MRSA bacteremia with septic emboli to the lungs --weaned down to RA  History of MRSA bacteremia with mitral valve endocarditis and PFO and septic emboli to the brain and left lung -He previously completed antibiotics on 11/25/2020 but now is back with another bacteremia as above so is now going to be on IV Vancomycin, IV Gentamicin, and IV Rifampin once Cx's clear   Anemia of chronic kidney disease --Hgb 9's --Monitor Hgb and transfuse to keep Hgb >7  Hypokalemia --monitor and replete with oral potassium PRN  Chronic Pain Syndrome with Chronic Opiate Dependence -He reports chronic back pain as well as right lower extremity that was recently amputated --reduce oxycontin to 10 mg q12h today  Status post right AKA -Recently had an AKA done in the last 2 months -Continue with Pregabalin 75 mg p.o. nightly  Liver cirrhosis secondary to chronic hepatitis C -His HCC quantitative was 1,750,000 on 09/29/2020 -Infectious diseases been consulted given his MRSA bacteremia again and appreciate further evaluation recommendations.  GERD -cont PPI  Hyperbilirubinemia, mild --monitor   DVT prophylaxis: lovenox Code Status: FULL CODE  Family Communication:   Status is: Inpatient  Dispo: The patient is from: Home              Anticipated d/c is to: Home              Patient currently is not medically stable to d/c.  Bacteremia not cleared.     Difficult to place patient No  Consultants:   Infectious Diseases  Nephrology  Cardiology   Neurology  Cardiothoracic Surgery  Palliative Care   Procedures:  TEE done by Dr. Debara Pickett  Findings:  1. LEFT VENTRICLE: The left ventricular wall thickness is moderately increased.  The left ventricular cavity is normal in size. Wall motion is normal.  LVEF is 60-65%.  2. RIGHT VENTRICLE:  The right ventricle is severely dilated without any thrombus or masses.    3. LEFT ATRIUM:  The left atrium is normal in size without any thrombus or masses.  There is not spontaneous echo contrast ("smoke") in the left atrium consistent with a low flow state.  4. LEFT ATRIAL APPENDAGE:  The large left atrial appendage is free of any thrombus or masses. The appendage has single lobes. Pulse doppler indicates high flow in the appendage.  5. ATRIAL SEPTUM:  The atrial septum appears intact and is free of thrombus and/or masses.  There is no evidence for interatrial shunting by color doppler and saline microbubble.  6. RIGHT ATRIUM:  The right atrium is severely dilated in size and function without any thrombus or masses.  7. 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 There is  trivial regurgitation.  There is a possible small mobile vegetation adherent to the posterior leaflet which appears filamentous, this could also be residual valve tissue or suture.   8. AORTIC VALVE:  The aortic valve is trileaflet, normal in structure and function with no regurgitation.  The valve was visualized with 2D/3D modes - there appears to be a small mobile density at the base of the left coronary cusp seen in the long-axis view on the outflow side of the leaflet, possibly consistent with vegetation.  9. TRICUSPID VALVE:  The tricuspid valve is normal in structure and function with Mild regurgitation.  There were no vegetations or stenosis  10.  PULMONIC VALVE:  The pulmonic  valve is normal in structure and function with trivial regurgitation.  There were no vegetations or stenosis.   11. AORTIC ARCH, ASCENDING AND DESCENDING AORTA:  There was grade 2 Ron Parker et. Al, 1992) atherosclerosis of the ascending aorta, aortic arch, or proximal descending aorta.  12. PULMONARY VEINS: Anomalous pulmonary venous return was not noted.  13. PERICARDIUM: The pericardium appeared normal and non-thickened.  There is no pericardial effusion.  IMPRESSION:   1. 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  2. No LAA thrombus 3. Negative for PFO 4. Moderate LVH 5. Severe RA/RV dilitation with mild TR 6. LVEF 60-65%  RECOMMENDATIONS:    1. Consider longer course of antibiotics for possible valvular vegetations, including a small, mobile filamentous structure on the posterior bioprosthetic valve leaflet which is concerning for vegetation.  Antimicrobials:  Anti-infectives (From admission, onward)   Start     Dose/Rate Route Frequency Ordered Stop   12/31/20 1152  vancomycin (VANCOREADY) 750 MG/150ML IVPB       Note to Pharmacy: Gaynelle Adu  : cabinet override      12/31/20 1152 12/31/20 1218   12/29/20 1400  gentamicin (GARAMYCIN) IVPB 100 mg        100 mg 200 mL/hr over 30 Minutes Intravenous Every M-W-F 12/29/20 1312     12/29/20 0932  vancomycin (VANCOREADY) 750 MG/150ML IVPB       Note to Pharmacy: Judieth Keens  : cabinet override      12/29/20 0932 12/29/20 1114   12/27/20 1800  gentamicin (GARAMYCIN) IVPB 80 mg  Status:  Discontinued        80 mg 100 mL/hr over 30 Minutes Intravenous Every M-W-F (1800) 12/24/20 1612 12/29/20 1312   12/27/20 0813  vancomycin (VANCOREADY) 750 MG/150ML IVPB  Status:  Discontinued       Note to Pharmacy: Louretta Shorten   : cabinet override      12/27/20 0813 12/27/20 0815   12/24/20 1700  gentamicin (GARAMYCIN) IVPB 120 mg        120 mg 200 mL/hr over 30  Minutes Intravenous  Once 12/24/20 1612 12/24/20 1910   12/24/20 1430  vancomycin (VANCOCIN) IVPB 750 mg/150 ml premix        750 mg 150 mL/hr over 60 Minutes Intravenous Every M-W-F (Hemodialysis) 12/24/20 1324     12/22/20 1201  vancomycin variable dose per unstable renal function (pharmacist  dosing)  Status:  Discontinued         Does not apply See admin instructions 12/22/20 1202 12/24/20 1324   12/22/20 1200  vancomycin (VANCOREADY) IVPB 750 mg/150 mL  Status:  Discontinued        750 mg 150 mL/hr over 60 Minutes Intravenous Every M-W-F (Hemodialysis) 12/22/20 0845 12/22/20 1202   12/22/20 1200  ceFEPIme (MAXIPIME) 2 g in sodium chloride 0.9 % 100 mL IVPB  Status:  Discontinued        2 g 200 mL/hr over 30 Minutes Intravenous Every M-W-F (Hemodialysis) 12/22/20 0845 12/23/20 1450   12/22/20 0715  vancomycin (VANCOREADY) IVPB 1500 mg/300 mL        1,500 mg 150 mL/hr over 120 Minutes Intravenous  Once 12/22/20 0714 12/22/20 1034   12/22/20 0715  ceFEPIme (MAXIPIME) 2 g in sodium chloride 0.9 % 100 mL IVPB        2 g 200 mL/hr over 30 Minutes Intravenous  Once 12/22/20 0714 12/22/20 0806         Subjective: Pt said he didn't eat his meal because it's the same food every day.  No pain.   Objective: Vitals:   01/02/21 0445 01/02/21 0500 01/02/21 0732 01/02/21 1157  BP: 115/82  117/88 116/81  Pulse: 100  97 90  Resp: '16  16 16  '$ Temp: 99.2 F (37.3 C)  98.6 F (37 C) 98.6 F (37 C)  TempSrc: Axillary  Oral Oral  SpO2: 97%  99% 98%  Weight:  70.6 kg    Height:        Intake/Output Summary (Last 24 hours) at 01/02/2021 1558 Last data filed at 01/02/2021 0733 Gross per 24 hour  Intake 270 ml  Output --  Net 270 ml   Filed Weights   12/31/20 1347 12/31/20 1431 01/02/21 0500  Weight: 70.9 kg 70.9 kg 70.6 kg   Examination:  Constitutional: NAD, AAOx3 HEENT: conjunctivae and lids normal, EOMI CV: No cyanosis.   RESP: normal respiratory effort, on RA Extremities: No  effusions, edema in LLE.  Right AKA. SKIN: warm, dry Neuro: II - XII grossly intact.   Psych: Normal mood and affect.  Appropriate judgement and reason   Data Reviewed: I have personally reviewed following labs and imaging studies  CBC: Recent Labs  Lab 12/27/20 0218 12/28/20 0222 12/29/20 0035 12/30/20 0158 12/31/20 0100 01/01/21 0030  WBC 20.4* 18.8* 20.6* 19.4* 22.6* 20.4*  NEUTROABS 15.3* 14.1* 17.1*  --   --   --   HGB 9.2* 9.6* 9.5* 9.2* 9.2* 10.3*  HCT 30.7* 32.4* 31.0* 30.8* 30.4* 34.5*  MCV 78.9* 80.0 79.3* 79.6* 78.8* 78.4*  PLT 330 337 317 330 290 [id number]   Basic Metabolic Panel: Recent Labs  Lab 12/27/20 0218 12/28/20 0222 12/29/20 0035 12/30/20 0158 12/31/20 0100 01/01/21 0030  NA 132* 134* 133*  133* 131* 131* 133*  K 3.3* 3.6 4.1  4.0 4.0 4.6 4.1  CL 95* 94* 94*  94* 94* 94* 96*  CO2 '22 27 26  25 25 22 25  '$ GLUCOSE 96 95 93  92 83 77 86  BUN 48* 25* 35*  34* 22* 38* 21*  CREATININE 7.45* 4.94* 6.51*  6.40* 4.37* 6.47* 4.32*  CALCIUM 9.4 9.1 9.5  9.6 8.7* 9.4 9.1  MG 2.3 2.1 2.2 2.0 2.0 2.1  PHOS 3.0 2.3* 3.7  3.7  --   --   --    GFR: Estimated Creatinine Clearance: 20.7 mL/min (A) (by C-G formula  based on SCr of 4.32 mg/dL (H)). Liver Function Tests: Recent Labs  Lab 12/27/20 0218 12/28/20 0222 12/29/20 0035  AST '22 25 28  '$ ALT '10 10 11  '$ ALKPHOS 71 87 74  BILITOT 1.5* 0.8 1.9*  PROT 8.7* 8.3* 8.2*  ALBUMIN 2.1* 2.2* 2.2*  2.1*   No results for input(s): LIPASE, AMYLASE in the last 168 hours. No results for input(s): AMMONIA in the last 168 hours. Coagulation Profile: No results for input(s): INR, PROTIME in the last 168 hours. Cardiac Enzymes: No results for input(s): CKTOTAL, CKMB, CKMBINDEX, TROPONINI in the last 168 hours. BNP (last 3 results) No results for input(s): PROBNP in the last 8760 hours. HbA1C: No results for input(s): HGBA1C in the last 72 hours. CBG: No results for input(s): GLUCAP in the last 168 hours. Lipid  Profile: No results for input(s): CHOL, HDL, LDLCALC, TRIG, CHOLHDL, LDLDIRECT in the last 72 hours. Thyroid Function Tests: No results for input(s): TSH, T4TOTAL, FREET4, T3FREE, THYROIDAB in the last 72 hours. Anemia Panel: No results for input(s): VITAMINB12, FOLATE, FERRITIN, TIBC, IRON, RETICCTPCT in the last 72 hours. Sepsis Labs: Recent Labs  Lab 12/27/20 0218 12/28/20 0222  PROCALCITON 28.84 23.98    Recent Results (from the past 240 hour(s))  Culture, blood (routine x 2)     Status: None   Collection Time: 12/24/20  3:10 PM   Specimen: BLOOD RIGHT HAND  Result Value Ref Range Status   Specimen Description BLOOD RIGHT HAND  Final   Special Requests   Final    BOTTLES DRAWN AEROBIC ONLY Blood Culture adequate volume   Culture   Final    NO GROWTH 5 DAYS Performed at [facility], [address] [place], [place] [address]    Report Status 12/29/2020 FINAL  Final  Culture, blood (routine x 2)     Status: None   Collection Time: 12/24/20  3:37 PM   Specimen: BLOOD  Result Value Ref Range Status   Specimen Description BLOOD BLOOD RIGHT FOREARM  Final   Special Requests   Final    BOTTLES DRAWN AEROBIC ONLY Blood Culture adequate volume   Culture   Final    NO GROWTH 5 DAYS Performed at [facility], [place] [address], [place], [place] [address]    Report Status 12/29/2020 FINAL  Final  MRSA PCR Screening     Status: None   Collection Time: 12/25/20 10:18 PM   Specimen: Nasal Mucosa; Nasopharyngeal  Result Value Ref Range Status   MRSA by PCR NEGATIVE NEGATIVE Final    Comment:        The GeneXpert MRSA Assay (FDA approved for NASAL specimens only), is one component of a comprehensive MRSA colonization surveillance program. It is not intended to diagnose MRSA infection nor to guide or monitor treatment for MRSA infections. Performed at [facility], [place] [address] [place],  [address]   Culture, blood (routine x 2)     Status: Abnormal    Collection Time: 12/26/20 10:19 AM   Specimen: BLOOD RIGHT HAND  Result Value Ref Range Status   Specimen Description BLOOD RIGHT HAND  Final   Special Requests   Final    BOTTLES DRAWN AEROBIC ONLY Blood Culture results may not be optimal due to an inadequate volume of blood received in culture bottles   Culture  Setup Time   Final    GRAM POSITIVE COCCI IN CLUSTERS AEROBIC BOTTLE ONLY CRITICAL RESULT CALLED TO, READ BACK  BY AND VERIFIED WITH: PHARMD TM TUCKER [id number] AT 717 BY CM    Culture (A)  Final    STAPHYLOCOCCUS AUREUS SUSCEPTIBILITIES PERFORMED ON PREVIOUS CULTURE WITHIN THE LAST 5 DAYS. Performed at [facility], [place] [address], [place], [place] [address]    Report Status 12/29/2020 FINAL  Final  Culture, blood (routine x 2)     Status: Abnormal (Preliminary result)   Collection Time: 12/26/20 10:19 AM   Specimen: BLOOD RIGHT WRIST  Result Value Ref Range Status   Specimen Description BLOOD RIGHT WRIST  Final   Special Requests   Final    BOTTLES DRAWN AEROBIC ONLY Blood Culture results may not be optimal due to an inadequate volume of blood received in culture bottles   Culture  Setup Time   Final    GRAM POSITIVE COCCI IN CLUSTERS AEROBIC BOTTLE ONLY CRITICAL RESULT CALLED TO, READ BACK BY AND VERIFIED WITH: PHARMD M TUCKER [id number] AT 25 BY CM    Culture (A)  Final    METHICILLIN RESISTANT STAPHYLOCOCCUS AUREUS Sent to [place] for further susceptibility testing. Performed at [facility], [place] [address], [place], [place] [address]    Report Status PENDING  Incomplete   Organism ID, Bacteria METHICILLIN RESISTANT STAPHYLOCOCCUS AUREUS  Final      Susceptibility   Methicillin resistant staphylococcus aureus - MIC*    CIPROFLOXACIN >=8 RESISTANT Resistant     ERYTHROMYCIN >=8 RESISTANT Resistant     GENTAMICIN <=0.5 SENSITIVE Sensitive     OXACILLIN >=4 RESISTANT Resistant     TETRACYCLINE <=1 SENSITIVE Sensitive     VANCOMYCIN 1 SENSITIVE Sensitive      TRIMETH/SULFA <=10 SENSITIVE Sensitive     CLINDAMYCIN <=0.25 SENSITIVE Sensitive     RIFAMPIN <=0.5 SENSITIVE Sensitive     Inducible Clindamycin NEGATIVE Sensitive     * METHICILLIN RESISTANT STAPHYLOCOCCUS AUREUS  Culture, blood (routine x 2)     Status: Abnormal   Collection Time: 12/27/20  2:17 PM   Specimen: BLOOD  Result Value Ref Range Status   Specimen Description BLOOD THUMB  Final   Special Requests   Final    BOTTLES DRAWN AEROBIC AND ANAEROBIC Blood Culture adequate volume   Culture  Setup Time   Final    GRAM POSITIVE COCCI ANAEROBIC BOTTLE ONLY CRITICAL VALUE NOTED.  VALUE IS CONSISTENT WITH PREVIOUSLY REPORTED AND CALLED VALUE.    Culture (A)  Final    STAPHYLOCOCCUS AUREUS SUSCEPTIBILITIES PERFORMED ON PREVIOUS CULTURE WITHIN THE LAST 5 DAYS. Performed at [facility], [place] [address], [place], [place] [address]    Report Status 01/02/2021 FINAL  Final  Culture, blood (routine x 2)     Status: Abnormal   Collection Time: 12/27/20  2:17 PM   Specimen: BLOOD RIGHT HAND  Result Value Ref Range Status   Specimen Description BLOOD RIGHT HAND  Final   Special Requests   Final    BOTTLES DRAWN AEROBIC AND ANAEROBIC Blood Culture adequate volume   Culture  Setup Time   Final    GRAM POSITIVE COCCI IN CLUSTERS ANAEROBIC BOTTLE ONLY CRITICAL VALUE NOTED.  VALUE IS CONSISTENT WITH PREVIOUSLY REPORTED AND CALLED VALUE. Performed at [facility], [place] [address] [place], [place] [address]    Culture METHICILLIN RESISTANT STAPHYLOCOCCUS AUREUS (A)  Final   Report Status 12/31/2020 FINAL  Final   Organism ID, Bacteria METHICILLIN RESISTANT STAPHYLOCOCCUS AUREUS  Final      Susceptibility  Methicillin resistant staphylococcus aureus - MIC*    CIPROFLOXACIN >=8 RESISTANT Resistant     ERYTHROMYCIN >=8 RESISTANT Resistant     GENTAMICIN <=0.5 SENSITIVE Sensitive     OXACILLIN >=4 RESISTANT Resistant     TETRACYCLINE <=1 SENSITIVE Sensitive     VANCOMYCIN  <=0.5 SENSITIVE Sensitive     TRIMETH/SULFA <=10 SENSITIVE Sensitive     CLINDAMYCIN <=0.25 SENSITIVE Sensitive     RIFAMPIN <=0.5 SENSITIVE Sensitive     Inducible Clindamycin NEGATIVE Sensitive     * METHICILLIN RESISTANT STAPHYLOCOCCUS AUREUS  Culture, blood (routine x 2)     Status: Abnormal   Collection Time: 12/29/20  1:43 PM   Specimen: BLOOD RIGHT HAND  Result Value Ref Range Status   Specimen Description BLOOD RIGHT HAND  Final   Special Requests   Final    BOTTLES DRAWN AEROBIC AND ANAEROBIC Blood Culture adequate volume   Culture  Setup Time   Final    GRAM POSITIVE COCCI ANAEROBIC BOTTLE ONLY CRITICAL VALUE NOTED.  VALUE IS CONSISTENT WITH PREVIOUSLY REPORTED AND CALLED VALUE.    Culture (A)  Final    STAPHYLOCOCCUS AUREUS SUSCEPTIBILITIES PERFORMED ON PREVIOUS CULTURE WITHIN THE LAST 5 DAYS. Performed at [facility], [place] [address], [place], [place] [address]    Report Status 01/01/2021 FINAL  Final  Culture, blood (routine x 2)     Status: None (Preliminary result)   Collection Time: 12/29/20  1:43 PM   Specimen: BLOOD RIGHT HAND  Result Value Ref Range Status   Specimen Description BLOOD RIGHT HAND  Final   Special Requests   Final    BOTTLES DRAWN AEROBIC AND ANAEROBIC Blood Culture adequate volume   Culture  Setup Time   Final    GRAM POSITIVE COCCI IN CLUSTERS ANAEROBIC BOTTLE ONLY CRITICAL VALUE NOTED.  VALUE IS CONSISTENT WITH PREVIOUSLY REPORTED AND CALLED VALUE.    Culture   Final    GRAM POSITIVE COCCI CULTURE REINCUBATED FOR BETTER GROWTH Performed at [facility], [place] [address] [place], [place] [address]    Report Status PENDING  Incomplete  Culture, blood (routine x 2)     Status: None (Preliminary result)   Collection Time: 12/31/20  8:48 AM   Specimen: BLOOD RIGHT HAND  Result Value Ref Range Status   Specimen Description BLOOD RIGHT HAND  Final   Special Requests   Final    BOTTLES DRAWN AEROBIC ONLY Blood Culture results may  not be optimal due to an excessive volume of blood received in culture bottles   Culture   Final    NO GROWTH 2 DAYS Performed at [facility], [place] [address], [place], [place] [address]    Report Status PENDING  Incomplete  Culture, blood (routine x 2)     Status: None (Preliminary result)   Collection Time: 12/31/20  8:48 AM   Specimen: BLOOD RIGHT FOREARM  Result Value Ref Range Status   Specimen Description BLOOD RIGHT FOREARM  Final   Special Requests   Final    BOTTLES DRAWN AEROBIC ONLY Blood Culture adequate volume   Culture   Final    NO GROWTH 2 DAYS Performed at [facility], [place] [address] [place], [place] [address]    Report Status PENDING  Incomplete    RN Pressure Injury Documentation:     Estimated body mass index is 21.11 kg/m as calculated from the following:   Height as of this encounter: 6' (  1.829 m).   Weight as of this encounter: 70.6 kg.  Malnutrition Type:   Malnutrition Characteristics:   Nutrition Interventions:   Radiology Studies: DG Hand 2 View Right  Result Date: 12/31/2020 CLINICAL DATA:  Pain at first digit EXAM: RIGHT HAND - 2 VIEW COMPARISON:  None. FINDINGS: Frontal and lateral views of the right hand are obtained. No fracture, subluxation, or dislocation. There is mild joint space narrowing at the first metacarpophalangeal joint, first interphalangeal joint, and throughout the remaining distal interphalangeal joints consistent with osteoarthritis. Vascular calcifications are seen throughout the hand and wrist. Soft tissues are otherwise unremarkable. IMPRESSION: 1. 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 2. No acute bony abnormality. Electronically Signed   By: Randa Ngo M.D.   On: 12/31/2020 19:27   Scheduled Meds: . (feeding supplement) PROSource Plus  30 mL Oral BID BM  . aspirin EC  325 mg Oral Daily  . Chlorhexidine Gluconate Cloth  6 each Topical Q0600  . cinacalcet  60 mg Oral Q supper  . diltiazem  120  mg Oral Daily  . docusate sodium  100 mg Oral BID  . enoxaparin (LOVENOX) injection  30 mg Subcutaneous Q24H  . feeding supplement (NEPRO CARB STEADY)  237 mL Oral TID BM  . methocarbamol  500 mg Oral TID  . metoprolol tartrate  50 mg Oral BID  . multivitamin with minerals  1 tablet Oral Daily  . oxyCODONE  10 mg Oral Q12H  . pantoprazole  40 mg Oral Daily  . pregabalin  75 mg Oral QHS  . senna-docusate  1 tablet Oral BID  . sodium chloride flush  3 mL Intravenous Q12H   Continuous Infusions: . gentamicin 100 mg (12/31/20 1446)  . vancomycin Stopped (12/27/20 1214)    LOS: 11 days   Enzo Bi, md [facility] PAGER is on [place]  If 7PM-7AM, please contact night-coverage [email or web address]

## 2021-01-02 NOTE — Plan of Care (Signed)
  Problem: Health Behavior/Discharge Planning: Goal: Ability to manage health-related needs will improve Outcome: Progressing   Problem: Clinical Measurements: Goal: Diagnostic test results will improve Outcome: Progressing   Problem: Clinical Measurements: Goal: Cardiovascular complication will be avoided Outcome: Progressing   Problem: Clinical Measurements: Goal: Respiratory complications will improve Outcome: Progressing   Problem: Coping: Goal: Level of anxiety will decrease Outcome: Progressing   Problem: Elimination: Goal: Will not experience complications related to bowel motility Outcome: Progressing Goal: Will not experience complications related to urinary retention Outcome: Progressing   Problem: Nutrition: Goal: Adequate nutrition will be maintained Outcome: Progressing

## 2021-01-02 NOTE — Progress Notes (Signed)
[facility] Progress Note   Dialysis Orders: MWF -South 4hrs, [id number], DFR500, EDW 72kg,2K/2CaProfile 2  Access:LU AVG vs BCF Heparin5500 unit qHD Mircera261mg q2wks - last 5/9 Venofer '100mg'$  qHD x5 - 1 completed  Assessment/ Plan:   1. 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  2. 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 WBCs 28 -> 20.4. On  Vancomycin and Gent per pharmacy. s/p repeat TEE with MV vegetations.  MRI thoracic spine -> concerning for osteo/discitis T10/11.  Per primary team. ID following -> Vanc + gent and to add rifampin in am if bld cx still neg. 6wk course with 5/27 (1st day of neg bld cxs - was + on 5/25)   3. Back pain -MRI lumbar spine with no discitis/osteomyelitis, +degenerative disc disease at L4-L5, L5-S1. ID plans for MRI T spine. 4. Hypoxia-resolved but req O2 [place] at night.  5. ESRD- On HD MWF. Continue on schedule.   HD 5/23 net UF 3.5L, 5/25 3L, 5/27 4L  Next HD Monday; will have him 1st shift. Counseled on watching fluids so we can avoid hd this weekend.   6. Hypertension/volume-BP elevated. Resume home meds. Under dry weight if weights correct. UF has been limited by hypotension. Tolerating UF on HD fortunately. 7. Anemiaof CKD- Hgb 9.7- Next ESA dose due 5/23. .Marland KitchenHold IV iron due to acute infection. 8. Secondary Hyperparathyroidism -Ca in goal.Phos low - binders stopped -> phos 3 5/23. Continue sensipar '60mg'$  qd. Not on VDRA. 9. Nutrition- Renal diet w/fluid restriction. Protein supplement.   Subjective:   No new events and still appears  comfortable this am.  Denies subjective fevers overnight or chills. He denies any shortness of breath unless exerting himself. Has an appetite. On review of chart TMax 99.2 and not as persistently tachy.   Objective:   BP 115/82 (BP  Location: Right Wrist)   Pulse 100   Temp 99.2 F (37.3 C) (Axillary)   Resp 16   Ht 6' (1.829 m)   Wt 70.6 kg   SpO2 97%   BMI 21.11 kg/m   Intake/Output Summary (Last 24 hours) at 01/02/2021 0703 Last data filed at 01/01/2021 1700 Gross per 24 hour  Intake 720 ml  Output --  Net 720 ml   Weight change: -0.3 kg  Physical Exam: General: alert, oriented x3, male patient in NAD, sitting on side of  bed Heart: RRR Lungs: clear bilaterally, occasional rhonchi  Abdomen:soft, NTND Extremities: R AKA, no edema Dialysis Access: LU BCF w/ bypass graft strong bruit   Imaging: DG Hand 2 View Right  Result Date: 12/31/2020 CLINICAL DATA:  Pain at first digit EXAM: RIGHT HAND - 2 VIEW COMPARISON:  None. FINDINGS: Frontal and lateral views of the right hand are obtained. No fracture, subluxation, or dislocation. There is mild joint space narrowing at the first metacarpophalangeal joint, first interphalangeal joint, and throughout the remaining distal interphalangeal joints consistent with osteoarthritis. Vascular calcifications are seen throughout the hand and wrist. Soft tissues are otherwise unremarkable. IMPRESSION: 1. 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 2. No acute bony abnormality. Electronically Signed   By: MRanda NgoM.D.   On: 12/31/2020 19:27    Labs: BMET Recent Labs  Lab 12/27/20 0218 12/28/20 0222 12/29/20 0035 12/30/20 0158 12/31/20 0100 01/01/21 0030  NA 132* 134* 133*  133* 131* 131* 133*  K 3.3* 3.6 4.1  4.0 4.0 4.6 4.1  CL 95* 94* 94*  94* 94* 94* 96*  CO2 '22 27 26  25 25 22 25  '$ GLUCOSE 96 95 93  92 83 77 86  BUN 48* 25* 35*  34* 22* 38* 21*  CREATININE 7.45* 4.94* 6.51*  6.40* 4.37* 6.47* 4.32*  CALCIUM 9.4 9.1 9.5  9.6 8.7* 9.4 9.1  PHOS 3.0 2.3* 3.7  3.7  --   --   --    CBC Recent Labs  Lab 12/27/20 0218 12/28/20 0222 12/29/20 0035 12/30/20 0158 12/31/20 0100 01/01/21 0030  WBC 20.4* 18.8* 20.6* 19.4* 22.6* 20.4*   NEUTROABS 15.3* 14.1* 17.1*  --   --   --   HGB 9.2* 9.6* 9.5* 9.2* 9.2* 10.3*  HCT 30.7* 32.4* 31.0* 30.8* 30.4* 34.5*  MCV 78.9* 80.0 79.3* 79.6* 78.8* 78.4*  PLT 330 337 317 330 290 338    Medications:    . (feeding supplement) PROSource Plus  30 mL Oral BID BM  . aspirin EC  325 mg Oral Daily  . Chlorhexidine Gluconate Cloth  6 each Topical Q0600  . cinacalcet  60 mg Oral Q supper  . diltiazem  120 mg Oral Daily  . docusate sodium  100 mg Oral BID  . enoxaparin (LOVENOX) injection  30 mg Subcutaneous Q24H  . feeding supplement (NEPRO CARB STEADY)  237 mL Oral TID BM  . methocarbamol  500 mg Oral TID  . metoprolol tartrate  50 mg Oral BID  . multivitamin with minerals  1 tablet Oral Daily  . oxyCODONE  10 mg Oral Q12H  . pantoprazole  40 mg Oral Daily  . pregabalin  75 mg Oral QHS  . senna-docusate  1 tablet Oral BID  . sodium chloride flush  3 mL Intravenous Q12H      Otelia Santee, MD 01/02/2021, 7:03 AM

## 2021-01-03 DIAGNOSIS — R7881 Bacteremia: Secondary | ICD-10-CM | POA: Diagnosis not present

## 2021-01-03 DIAGNOSIS — N186 End stage renal disease: Secondary | ICD-10-CM | POA: Diagnosis not present

## 2021-01-03 DIAGNOSIS — Z89611 Acquired absence of right leg above knee: Secondary | ICD-10-CM | POA: Diagnosis not present

## 2021-01-03 DIAGNOSIS — Z515 Encounter for palliative care: Secondary | ICD-10-CM | POA: Diagnosis not present

## 2021-01-03 LAB — BASIC METABOLIC PANEL
Anion gap: 17 — ABNORMAL HIGH (ref 5–15)
BUN: 62 mg/dL — ABNORMAL HIGH (ref 6–20)
CO2: 22 mmol/L (ref 22–32)
Calcium: 9.2 mg/dL (ref 8.9–10.3)
Chloride: 91 mmol/L — ABNORMAL LOW (ref 98–111)
Creatinine, Ser: 8.5 mg/dL — ABNORMAL HIGH (ref 0.61–1.24)
GFR, Estimated: 7 mL/min — ABNORMAL LOW (ref >60)
Glucose, Bld: 105 mg/dL — ABNORMAL HIGH (ref 70–99)
Potassium: 4.5 mmol/L (ref 3.5–5.1)
Sodium: 130 mmol/L — ABNORMAL LOW (ref 135–145)

## 2021-01-03 LAB — CBC
HCT: 32.4 % — ABNORMAL LOW (ref 39.0–52.0)
Hemoglobin: 9.8 g/dL — ABNORMAL LOW (ref 13.0–17.0)
MCH: 23.8 pg — ABNORMAL LOW (ref 26.0–34.0)
MCHC: 30.2 g/dL (ref 30.0–36.0)
MCV: 78.6 fL — ABNORMAL LOW (ref 80.0–100.0)
Platelets: 379 10*3/uL (ref 150–400)
RBC: 4.12 MIL/uL — ABNORMAL LOW (ref 4.22–5.81)
RDW: 21.2 % — ABNORMAL HIGH (ref 11.5–15.5)
WBC: 18.1 10*3/uL — ABNORMAL HIGH (ref 4.0–10.5)
nRBC: 0 % (ref 0.0–0.2)

## 2021-01-03 LAB — CULTURE, BLOOD (ROUTINE X 2): Special Requests: ADEQUATE

## 2021-01-03 LAB — MAGNESIUM: Magnesium: 2.5 mg/dL — ABNORMAL HIGH (ref 1.7–2.4)

## 2021-01-03 LAB — VANCOMYCIN, RANDOM: Vancomycin Rm: 23

## 2021-01-03 LAB — GENTAMICIN LEVEL, RANDOM: Gentamicin Rm: 4.8 ug/mL

## 2021-01-03 MED ORDER — LIDOCAINE-PRILOCAINE 2.5-2.5 % EX CREA: 1.0000 "application " | TOPICAL_CREAM | CUTANEOUS | Status: DC | PRN

## 2021-01-03 MED ORDER — HEPARIN SODIUM (PORCINE) 1000 UNIT/ML DIALYSIS
5000.0000 [IU] | Freq: Once | INTRAMUSCULAR | Status: DC
  Filled 2021-01-03: qty 5

## 2021-01-03 MED ORDER — DARBEPOETIN ALFA 100 MCG/0.5ML IJ SOSY
100.0000 ug | PREFILLED_SYRINGE | INTRAMUSCULAR | Status: DC
  Administered 2021-01-03: 100 ug via INTRAVENOUS

## 2021-01-03 MED ORDER — SODIUM CHLORIDE 0.9 % IV SOLN: 100.0000 mL | INTRAVENOUS | Status: DC | PRN

## 2021-01-03 MED ORDER — ALTEPLASE 2 MG IJ SOLR: 2.0000 mg | Freq: Once | INTRAMUSCULAR | Status: DC | PRN

## 2021-01-03 MED ORDER — GENTAMICIN SULFATE 40 MG/ML IJ SOLN
90.0000 mg | INTRAVENOUS | Status: DC
  Administered 2021-01-03 – 2021-01-06 (×2): 90 mg via INTRAVENOUS
  Filled 2021-01-03 (×3): qty 2.25

## 2021-01-03 MED ORDER — PENTAFLUOROPROP-TETRAFLUOROETH EX AERO: 1.0000 "application " | INHALATION_SPRAY | CUTANEOUS | Status: DC | PRN

## 2021-01-03 MED ORDER — HEPARIN SODIUM (PORCINE) 1000 UNIT/ML DIALYSIS: 1000.0000 [IU] | INTRAMUSCULAR | Status: DC | PRN

## 2021-01-03 MED ORDER — LIDOCAINE HCL (PF) 1 % IJ SOLN: 5.0000 mL | INTRAMUSCULAR | Status: DC | PRN

## 2021-01-03 MED ORDER — HEPARIN SODIUM (PORCINE) 1000 UNIT/ML DIALYSIS: 3000.0000 [IU] | INTRAMUSCULAR | Status: DC | PRN

## 2021-01-03 NOTE — Progress Notes (Signed)
    Progress Note from the Palliative Medicine Team at [facility]   Patient Name: Patrick Anthony        Date: 01/03/2021 DOB: April 25, 1971  Age: 50 y.o. MRN#: [id number] Attending Physician: Enzo Bi, MD Primary Care Physician: [name], MD Admit Date: 12/22/2020   Medical records reviewed. Continues on IV vancomycin and gentamicin per infectious disease.  Blood cultures initially positive on 518 and cleared on 520.  Repeat blood cultures on 522, 523, and 525 are positive for MRSA.  Continued follow-up of repeat blood cultures.  Continues on hemodialysis for end-stage renal disease.  Infectious disease recommended referral to cardiothoracic surgery who felt that he is not a candidate for redo of surgical intervention/valve replacement.  This NP/ Walden Field visited patient at the bedside in HD unit as a follow up to [name].   He states he is doing well.  He is not currently having pain, no distress.  He states that per his understanding they are continuing antibiotics for his infection.  He again confirmed his wishes for continued full scope of treatment.  He stated appreciation of palliative medicine for continuing to see him and support him.  Discussed with patient the importance of continued conversation with family and their  medical providers regarding overall plan of care and treatment options,  ensuring decisions are within the context of the patients values and GOCs.  Questions and concerns addressed. No further questions or concerns.  Total time spent on the unit was 15 minutes.  Greater than 50% of the time was spent in counseling and coordination of care   Wadie Lessen NP is in agreement with above assessment and plan  Palliative Medicine Team Team Phone # [phone] Pager [phone]

## 2021-01-03 NOTE — Progress Notes (Signed)
Saw pt prior to leaving for HD. No c/o pain. Will admin meds once pt is back on unit.

## 2021-01-03 NOTE — Progress Notes (Signed)
Pharmacy Antibiotic Note  Patrick Anthony is a 50 y.o. male admitted on 12/22/2020 with MRSA bacteremia. He was found to have possible small vegetations associated with the bioprosthetic mitral valve and native aortic valve on TEE.  Pharmacy has been consulted for gentamicin and vancomycin dosing for prosthetic valve endocarditis. Patient is a HD patient with sessions on MWF.   5/30: Currently receiving HD session, has been tolerating 4 hour sessions.  Persistent bacteremia with repeat cultures on 5/23 and 5/25 positive for MRSA. Repeat blood cultures on 5/27 are currently ngtd.  On 5/30, pre-HD vanc level came back at 23 (within goal) and pre-HD gentamicin came back as 4.8 (goal 3-4). Will reduce gentamicin to 90 mg every MWF after HD.    Plan: Reduce gentamicin to 900 mg every MWF after HD  Goal pre-HD level 3-4 Continue vancomycin 750 mg every MWF with HD  Monitor blood cultures and HD schedule   Will also plan to add rifampin once blood cultures clear  Height: 6' (182.9 cm) Weight: 67.2 kg (148 lb 2.4 oz) IBW/kg (Calculated) : 77.6  Temp (24hrs), Avg:98.5 F (36.9 C), Min:97.7 F (36.5 C), Max:99.6 F (37.6 C)  Recent Labs  Lab 12/29/20 0035 12/30/20 0158 12/31/20 0100 01/01/21 0030 01/03/21 0657  WBC 20.6* 19.4* 22.6* 20.4* 18.1*  CREATININE 6.51*  6.40* 4.37* 6.47* 4.32* 8.50*  VANCORANDOM 24  --   --   --  23  GENTRANDOM 2.7  --   --   --  4.8    Estimated Creatinine Clearance: 10 mL/min (A) (by C-G formula based on SCr of 8.5 mg/dL (H)).    No Known Allergies  Dimple Nanas, PharmD PGY-1 Acute Care Pharmacy Resident 01/03/2021 10:23 AM

## 2021-01-03 NOTE — Progress Notes (Signed)
Assessment performed this afternoon when pt arrived back from HD. Pt left for HD during shift change.

## 2021-01-03 NOTE — Progress Notes (Signed)
[facility] Progress Note   Subjective:   Pt seen on HD, tolerating treatment well. No new concerns. Denies SOB, CP, palpitations, dizziness, and nausea.   Objective Vitals:   01/03/21 0827 01/03/21 0830 01/03/21 0900 01/03/21 0930  BP: 111/62 112/67 117/72 121/64  Pulse: 83 86 85 87  Resp:      Temp:      TempSrc:      SpO2:      Weight:      Height:       Physical Exam General: WDWN male, alert and in NAD Heart: RRR, no murmur auscultated Lungs: CTA bilaterally without wheezing, rhonchi or rales Abdomen: Soft, non-tender, non-distended, +BS Extremities: R AKA, no edema LLE Dialysis Access: LUE AVG accessed  Additional Objective Labs: Basic Metabolic Panel: Recent Labs  Lab 12/28/20 0222 12/29/20 0035 12/30/20 0158 12/31/20 0100 01/01/21 0030 01/03/21 0657  NA 134* 133*  133*   < > 131* 133* 130*  K 3.6 4.1  4.0   < > 4.6 4.1 4.5  CL 94* 94*  94*   < > 94* 96* 91*  CO2 '27 26  25   '$ < > '22 25 22  '$ GLUCOSE 95 93  92   < > 77 86 105*  BUN 25* 35*  34*   < > 38* 21* 62*  CREATININE 4.94* 6.51*  6.40*   < > 6.47* 4.32* 8.50*  CALCIUM 9.1 9.5  9.6   < > 9.4 9.1 9.2  PHOS 2.3* 3.7  3.7  --   --   --   --    < > = values in this interval not displayed.   Liver Function Tests: Recent Labs  Lab 12/28/20 0222 12/29/20 0035  AST 25 28  ALT 10 11  ALKPHOS 87 74  BILITOT 0.8 1.9*  PROT 8.3* 8.2*  ALBUMIN 2.2* 2.2*  2.1*   CBC: Recent Labs  Lab 12/28/20 0222 12/29/20 0035 12/30/20 0158 12/31/20 0100 01/01/21 0030 01/03/21 0657  WBC 18.8* 20.6* 19.4* 22.6* 20.4* 18.1*  NEUTROABS 14.1* 17.1*  --   --   --   --   HGB 9.6* 9.5* 9.2* 9.2* 10.3* 9.8*  HCT 32.4* 31.0* 30.8* 30.4* 34.5* 32.4*  MCV 80.0 79.3* 79.6* 78.8* 78.4* 78.6*  PLT 337 317 330 290 338 379   Blood Culture    Component Value Date/Time   SDES BLOOD RIGHT HAND 12/31/2020 0848   SDES BLOOD RIGHT FOREARM 12/31/2020 0848   SPECREQUEST  12/31/2020 0848    BOTTLES DRAWN  AEROBIC ONLY Blood Culture results may not be optimal due to an excessive volume of blood received in culture bottles   SPECREQUEST  12/31/2020 0848    BOTTLES DRAWN AEROBIC ONLY Blood Culture adequate volume   CULT  12/31/2020 0848    NO GROWTH 3 DAYS Performed at [facility], [place] [address] [place], [place] [address]    CULT  12/31/2020 0848    NO GROWTH 3 DAYS Performed at [facility], [place] [address] [place], [place] [address]    REPTSTATUS PENDING 12/31/2020 0848   REPTSTATUS PENDING 12/31/2020 0848   Medications: . gentamicin 100 mg (12/31/20 1446)  . vancomycin Stopped (12/27/20 1214)   . (feeding supplement) PROSource Plus  30 mL Oral BID BM  . aspirin EC  325 mg Oral Daily  . Chlorhexidine Gluconate Cloth  6 each Topical Q0600  . cinacalcet  60 mg Oral Q supper  . diltiazem  120 mg Oral Daily  . docusate sodium  100 mg Oral BID  . enoxaparin (LOVENOX) injection  30 mg Subcutaneous Q24H  . feeding supplement (NEPRO CARB STEADY)  237 mL Oral TID BM  . methocarbamol  500 mg Oral TID  . metoprolol tartrate  50 mg Oral BID  . multivitamin with minerals  1 tablet Oral Daily  . oxyCODONE  10 mg Oral Q12H  . pantoprazole  40 mg Oral Daily  . pregabalin  75 mg Oral QHS  . senna-docusate  1 tablet Oral BID  . sodium chloride flush  3 mL Intravenous Q12H    Outpatient Dialysis Orders: MWF -South 4hrs, BFR350, DFR500, EDW 72kg,2K/2CaProfile 2  Access:LU AVG vs BCF Heparin5500 unit qHD Mircera285mg q2wks - last 5/9 Venofer '100mg'$  qHD x5 - 1 completed  Assessment/Plan:   1. 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  2. 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 On  Vancomycin and Gent per pharmacy. Repeat TEE with MV vegetations. MRI thoracic spine concerning for osteo/discitis T10/11. ID following and  recommending 6 week course of antibiotics after first day of bacteremia clearance (5/27).  3. Back pain -MRI lumbar spine with no discitis/osteomyelitis, +degenerative disc disease at L4-L5, L5-S1. Per primary team 4. Hypoxia-resolved but req O2 [place] at night.  5. ESRD- On HD MWF. Continue on schedule. HD 5/23 net UF 3.5L, 5/25 3L, 5/27 4L 6. Hypertension/volume-BP elevated. Resume home meds. Under dry weight if weights correct. Tolerating UF on HD fortunately. 7. Anemiaof CKD- Hgb9.8-Overdue for ESA, ordered today. Hold IV iron due to acute infection. 8. Secondary Hyperparathyroidism -Corrected calcium running high, not on VDRAContinue sensipar '60mg'$  qd and low calcium bath. Phosphorus binders d/c, phos remains at goal.  9. Nutrition- Renal diet w/fluid restriction. Protein supplement.    SAnice Paganini PA-C 01/03/2021, 10:05 AM  [facility] Pager: [phone]

## 2021-01-03 NOTE — Progress Notes (Signed)
PROGRESS NOTE    Patrick Anthony  [id number] DOB: 1970/09/01 DOA: 12/22/2020 PCP: [name], MD   Brief Narrative:  The patient is a 60 atrial African-American male with a past medical history significant for but not limited to end-stage renal disease on hemodialysis on Monday Wednesday Friday, chronic diastolic CHF, hyperlipidemia, history of liver cirrhosis secondary to hepatitis C, history of alcohol abuse, OSA as well as recent AKA done by Dr. Sharol Given on 10/29/2020 and other comorbidities who presented with a chief complaint of spiking fevers for last few days and then altered mental status.  Reportedly patient was at his facility when they were noticing that he was altered and history is difficult to be obtained from the patient himself.  Of note he has had a prolonged hospitalization from 09/27/20 until November 11, 2019 he was found to have MRSA bacteremia as well as mitral valve endocarditis with PFO and septic emboli to the brain along with that time.  He underwent a bioprosthetic mitral valve replacement along with closure of the PFO on 10/14/2020.  Also during that hospitalization he was treated for osteomyelitis and abscess of the right tibia status post AKA on 10/29/2020 by Dr. Sharol Given.  He was discharged to [facility] for continuation of vancomycin at the nursing facility until 11/25/2020.  Reportedly he is usually alert and oriented x4.  Yesterday he is only alert and oriented to person and place but not the situation.  He complained about severe pain in his back has been present for some time and he describes shooting pains in the right leg but was amputated.  He denies any shortness breath or cough.  In the ED he presented with a fever of 103 and rales were reported in the lower lung field on the right lung.  Upon arrival to the ED he was noted to be septic given that he had an elevated temperature, respirations as well as being tachycardia and had a new O2 requirement as well as being altered.   CT scan of the brain showed no acute abnormalities but did show several changes that were chronic injury to the areas of infarct noted on the previous MRI.  His WBC was elevated 20,200.  Chest x-ray showed scarring in the right upper lobe, middle lobe atelectasis and a small left-sided pleural effusion stable cardiomegaly.  Initial sepsis protocol was initiated and he was given IV vancomycin and cefepime.  Further work-up reveals that he has another MRSA bacteremia and infectious diseases is involved again and nephrology is involved as well.  Patient is to get a repeat TEE and this was done today 12/24/20 to evaluate his mitral valve. Still pending to get a MRI the T-spine with and without contrast with hemodialysis after.  UnFortunately a TEE shows likely bioprosthetic valve infection so infectious disease is now going to add gentamicin and rifampin once his cultures are cleared.  We will still pursue his thoracic spine MRI which is still pending to be done.  Overnight the patient developed blurred vision and he underwent a head MRI which showed recurrent cardioembolic strokes in the setting of septic emboli from infective endocarditis.  Neurology was consulted and they did not recommend further work-up given that he recently just had an MRI of the brain with and without contrast on 09/28/2020 as well as an echo and carotid Dopplers.  Neurology recommending holding antiplatelets given the risk of hemorrhagic conversion of recurrent infective endocarditis related strokes and resume in 1 week if patient remains stable.  Patient's aspirin was then discontinued.  MRI of the spine shows early discitis-osteomyelitis at the T10-T11 level with mild perivertebral soft tissue edema at this level and no evidence of epidural fluid collection or abscess.  There is also moderate endplate marrow edema centered at C6-7 intervertebral disc space similar to previous MRI and small bilateral pleural effusion.  Cardiothoracic  surgery evaluated and they feel that he is a candidate for redo mitral valve replacement and aortic valve replacement 3 months following previous surgery as they do not feel that he would survive this.  They recommend continuing antibiotic therapy.  On the morning of 12/26/20 he went into SVT and heart rates spiked in the 150s and he was given at 250 mL bolus, tried vagal maneuvers which did not break his rhythm and he was given Tylenol for his temperature of 102.8.  Cardiology was consulted and he was given adenosine 6 mg and then it looked like 2-1 flutter and he was placed on a diltiazem drip and bolus.  Cardiology feels that he cannot be anticoagulated this time given his risk for hemorrhagic conversion.  Further review of telemetry showed that his heart rate may just represent sinus tachycardia and as there is no clear flutter waves and now cardiology transition him to oral diltiazem.  He Continues to spike temperatures and next hemodialysis session per nephrology.  Palliative care consulted and current goals of care discussion is still aggressive and full care p.o. diltiazem to 120 mg daily  Assessment & Plan:   Principal Problem:   Bacteremia due to Staphylococcus aureus Active Problems:   Anemia in chronic kidney disease   Hepatitis C   ESRD on dialysis (HCC)   S/P mitral valve replacement   S/P AKA (above knee amputation), right (HCC)   Personal history of Methicillin resistant Staphylococcus aureus infection   SIRS (systemic inflammatory response syndrome) (HCC)  Severe sepsis secondary to an MRSA bacteremia and Prostetic Valve Endocarditis present on admission -Presented the fever up to 103 F, tachycardia, tachypnea, a new oxygen requirement, altered mental status as well as a leukocytosis of 20,200 and also with hypotension -Chest x-ray on admission only noted low lung volumes and possible signs of atelectasis with no clear source of infection appreciated at that time -Blood  cultures now revealing an MRSA bacteremia; Repeat Blood Cx showed no growth to date but will obtain another set today given that he became febrile and tachycardic -He has been admitted progressive bed -He was initiated on IV antibiotics with vancomycin and cefepime but cefepime has now been discontinued -Infectious diseases has been consulted and plan further work-up for the patient and have ordered a repeat TEE and discussed with cardiology given his prostatic mitral valve and also will be getting a T-spine MRI with and without contrast with dialysis to be done afterwards; MRI done and showed that he has early changes of discitis and osteomyelitis at the T10-T11 level -Patient did get TEE which showed "Possible small vegetations associated with the bioprosthetic mitral valve and native aortic valve. No LAA thrombus. Negative for PFO. Moderate LVH Severe RA/RV dilitation with mild TR LVEF 60-65%." -We will be continuing vancomycin but because of Concern for possible Valvular Vegatations including a small, moblile Filamentous Structure on the posterior bioprosthetic vavlve leaflet was concerning for vegetation, ID now recommending PVE Coverage and will start Gentamicin and Add Rifampin once CX's Clear -procalcitonin level was 28.50 -> 28.84 -> 23.98 -CRP was 17.2 -Repeat blood cultures have been planned for 12/24/2020 -Patient continues  to have persistent sepsis physiology as he continues to spike temperatures and becomes tachycardic and tachypneic in the setting of his bacteremia -Appreciate further ID evaluation recommendations; sepsis physiology was improving but patient continues to still spike Temperatures and becomes intermittently tachycardic and tachypenic  -Patient's WBC minimally improved and went from 20.2 -> 19.7 -> 21.1 -> 28.9 -> 23.8 -> 20.4 -> 18.8 -Now infectious diseases recommending referral to cardiothoracic surgery and continuing gentamicin and vancomycin and adding rifampin later once  blood cultures remain negative; cardiothoracic surgery feels that he is not a candidate for redoing his surgical intervention -Repeat Blood Cx's from 12/26/20 POSITIVE AGAIN but the ones drawn a few days ago showed no growth to date -Infectious diseases to repeat blood cultures on 12/27/2020 showing NGTD <24 Hours so far  --Blood cultures initially positive on 5/18 and cleared on 5/20.  However repeat blood cultures on 522, 5/23 and 5/25 positive for MRSA Not a surgical candidate per T CTS. Plan: --cont IV vanc and gentamicin, per ID --f/u repeat blood cx  Acute CVAs recurrent cardioembolic strokes  in the setting of septic emboli from infective endocarditis -MRI of the brain overnight done and showed "Numerous punctate foci of abnormal diffusion restriction within both hemispheres in multiple vascular territories, consistent with acute infarcts.  Multiple scattered microhemorrhages in a nonspecific pattern." -Neurology was consulted and patient already had most of the stroke work-up done in the last few admissions and they did not recommend repeating echocardiogram as he had a TEE 520 confirming infective endocarditis -Neurology recommended holding antiplatelets at the time given risk of hemorrhagic conversion -DVT prophylaxis changed from heparin to SCDs and recommending resuming in 1 week -Neurology recommended continue telemetry monitoring and a 30-day event monitor on discharge if no arrhythmias were captured --resume home ASA 325 and DVT ppx  Elevated troponin 2/2 demand ischemia -Troponin was elevated and went to 774 and likely in the setting of demand ischemia in the setting of his acute illness with bacteremia and tachyarrhythmias; he did have normal coronary arteries in 10/2020  Arrhythmia/new onset SVT -Patient was found to be in SVT this morning and tried vagal maneuvers without success He was given a beta-blocker early this morning as well with out any improvement.  He is also given  250 normal saline bolus -Cardiology was consulted and troponins were obtained and his troponin level was 613 and trended up to 774 -Cardiology evaluated and gave the patient 6 mg of adenosine which slowed his rhythm and it looked like a 2-1 a flutter block so they started him on diltiazem drip -Once his arrhythmia improved and his heart rate slowed down cardiology reviewed his telemetry and they felt that it was not a flutter and may just represent sinus tachycardia so the will transition to oral diltiazem. -Cardiology following appreciate evaluation and Dr. Gardiner Rhyme evaluated today and patient is currently in sinus tachycardia with rates in the 100 and has been switched to p.o. diltiazem 30 mg 4 times daily and now further consolidated to diltiazem 140 mg p.o. daily -It is unclear if he did have atrial flutter but he is not a good candidate for anticoagulation given his multiple cardioembolic events from his infective endocarditis. Plan: --cont cardizem and lopressor  Hypotension, resolved -In the setting of MRSA bacteremia his admission blood pressures were as low as 84/51 but have now improved -Improved with IV fluid resuscitation -Continue to monitor blood pressures per protocol; blood pressures are still on the softer side but last blood pressure  reading was 160/110 -His home blood pressure medications include clonidine 0.2 mg p.o. twice daily as well as metoprolol tartrate 50 mg p.o. twice daily which have both been held -Goal MAP bolus of at least 65 --encourage oral hydration  HTN -His home blood pressure medications include clonidine 0.2 mg p.o. twice daily as well as metoprolol tartrate 50 mg p.o. twice daily which were held --cont cardizem and lopressor  Acute metabolic encephalopathy in the setting of infection, resolved -He is noted to be alert and oriented x2 but was previously alert and oriented x4 -CT scan did not show any acute abnormalities; he does have a prior history of  mitral valve endocarditis with septic emboli to the brain and to the lung  ESRD on Hemodialysis Monday Wednesday  Secondary hyperparathyroidism -Patient has a history of poorly cystic kidney disease --iHD per nephrology  Hyponatremia, mild --stable  Acute Respiratory Failure with Hypoxia, resolved -Was placed on 2 L supplemental oxygen via nasal cannula and does not wear supplemental oxygen at home -Chest x-ray yesterday showed "Lower lung volumes with atelectasis. Chronic cardiomegaly and right lung  Scarring." -Patient does have a history of MRSA bacteremia with septic emboli to the lungs --weaned down to RA  History of MRSA bacteremia with mitral valve endocarditis and PFO and septic emboli to the brain and left lung -He previously completed antibiotics on 11/25/2020 but now is back with another bacteremia as above so is now going to be on IV Vancomycin, IV Gentamicin, and IV Rifampin once Cx's clear   Anemia of chronic kidney disease --Hgb 9's --Monitor Hgb and transfuse to keep Hgb >7  Hypokalemia --monitor and replete with oral potassium PRN  Chronic Pain Syndrome with Chronic Opiate Dependence -He reports chronic back pain as well as right lower extremity that was recently amputated --reduce oxycontin to 10 mg q12h today  Status post right AKA -Recently had an AKA done in the last 2 months -Continue with Pregabalin 75 mg p.o. nightly  Liver cirrhosis secondary to chronic hepatitis C -His HCC quantitative was 1,750,000 on 09/29/2020 -Infectious diseases been consulted given his MRSA bacteremia again and appreciate further evaluation recommendations.  GERD -cont PPI  Hyperbilirubinemia, mild --monitor   DVT prophylaxis: lovenox Code Status: FULL CODE  Family Communication:   Status is: Inpatient  Dispo: The patient is from: Home              Anticipated d/c is to: Home              Patient currently is not medically stable to d/c.  Bacteremia not cleared.     Difficult to place patient No  Consultants:   Infectious Diseases  Nephrology  Cardiology   Neurology  Cardiothoracic Surgery  Palliative Care   Procedures:  TEE done by Dr. Debara Pickett  Findings:  1. LEFT VENTRICLE: The left ventricular wall thickness is moderately increased.  The left ventricular cavity is normal in size. Wall motion is normal.  LVEF is 60-65%.  2. RIGHT VENTRICLE:  The right ventricle is severely dilated without any thrombus or masses.    3. LEFT ATRIUM:  The left atrium is normal in size without any thrombus or masses.  There is not spontaneous echo contrast ("smoke") in the left atrium consistent with a low flow state.  4. LEFT ATRIAL APPENDAGE:  The large left atrial appendage is free of any thrombus or masses. The appendage has single lobes. Pulse doppler indicates high flow in the appendage.  5. ATRIAL SEPTUM:  The atrial septum appears intact and is free of thrombus and/or masses.  There is no evidence for interatrial shunting by color doppler and saline microbubble.  6. RIGHT ATRIUM:  The right atrium is severely dilated in size and function without any thrombus or masses.  7. 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 There is  trivial regurgitation.  There is a possible small mobile vegetation adherent to the posterior leaflet which appears filamentous, this could also be residual valve tissue or suture.   8. AORTIC VALVE:  The aortic valve is trileaflet, normal in structure and function with no regurgitation.  The valve was visualized with 2D/3D modes - there appears to be a small mobile density at the base of the left coronary cusp seen in the long-axis view on the outflow side of the leaflet, possibly consistent with vegetation.  9. TRICUSPID VALVE:  The tricuspid valve is normal in structure and function with Mild regurgitation.  There were no vegetations or stenosis  10.  PULMONIC VALVE:  The pulmonic  valve is normal in structure and function with trivial regurgitation.  There were no vegetations or stenosis.   11. AORTIC ARCH, ASCENDING AND DESCENDING AORTA:  There was grade 2 Ron Parker et. Al, 1992) atherosclerosis of the ascending aorta, aortic arch, or proximal descending aorta.  12. PULMONARY VEINS: Anomalous pulmonary venous return was not noted.  13. PERICARDIUM: The pericardium appeared normal and non-thickened.  There is no pericardial effusion.  IMPRESSION:   1. 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  2. No LAA thrombus 3. Negative for PFO 4. Moderate LVH 5. Severe RA/RV dilitation with mild TR 6. LVEF 60-65%  RECOMMENDATIONS:    1. Consider longer course of antibiotics for possible valvular vegetations, including a small, mobile filamentous structure on the posterior bioprosthetic valve leaflet which is concerning for vegetation.  Antimicrobials:  Anti-infectives (From admission, onward)   Start     Dose/Rate Route Frequency Ordered Stop   01/03/21 1400  gentamicin (GARAMYCIN) 90 mg in dextrose 5 % 50 mL IVPB        90 mg 104.5 mL/hr over 30 Minutes Intravenous Every M-W-F 01/03/21 1021     12/31/20 1152  vancomycin (VANCOREADY) 750 MG/150ML IVPB       Note to Pharmacy: Gaynelle Adu  : cabinet override      12/31/20 1152 12/31/20 1218   12/29/20 1400  gentamicin (GARAMYCIN) IVPB 100 mg  Status:  Discontinued        100 mg 200 mL/hr over 30 Minutes Intravenous Every M-W-F 12/29/20 1312 01/03/21 1021   12/29/20 0932  vancomycin (VANCOREADY) 750 MG/150ML IVPB       Note to Pharmacy: Judieth Keens  : cabinet override      12/29/20 0932 12/29/20 1114   12/27/20 1800  gentamicin (GARAMYCIN) IVPB 80 mg  Status:  Discontinued        80 mg 100 mL/hr over 30 Minutes Intravenous Every M-W-F (1800) 12/24/20 1612 12/29/20 1312   12/27/20 0813  vancomycin (VANCOREADY) 750 MG/150ML IVPB  Status:   Discontinued       Note to Pharmacy: Louretta Shorten   : cabinet override      12/27/20 0813 12/27/20 0815   12/24/20 1700  gentamicin (GARAMYCIN) IVPB 120 mg        120 mg 200 mL/hr over 30 Minutes Intravenous  Once 12/24/20 1612 12/24/20 1910   12/24/20 1430  vancomycin (  VANCOCIN) IVPB 750 mg/150 ml premix        750 mg 150 mL/hr over 60 Minutes Intravenous Every M-W-F (Hemodialysis) 12/24/20 1324     12/22/20 1201  vancomycin variable dose per unstable renal function (pharmacist dosing)  Status:  Discontinued         Does not apply See admin instructions 12/22/20 1202 12/24/20 1324   12/22/20 1200  vancomycin (VANCOREADY) IVPB 750 mg/150 mL  Status:  Discontinued        750 mg 150 mL/hr over 60 Minutes Intravenous Every M-W-F (Hemodialysis) 12/22/20 0845 12/22/20 1202   12/22/20 1200  ceFEPIme (MAXIPIME) 2 g in sodium chloride 0.9 % 100 mL IVPB  Status:  Discontinued        2 g 200 mL/hr over 30 Minutes Intravenous Every M-W-F (Hemodialysis) 12/22/20 0845 12/23/20 1450   12/22/20 0715  vancomycin (VANCOREADY) IVPB 1500 mg/300 mL        1,500 mg 150 mL/hr over 120 Minutes Intravenous  Once 12/22/20 0714 12/22/20 1034   12/22/20 0715  ceFEPIme (MAXIPIME) 2 g in sodium chloride 0.9 % 100 mL IVPB        2 g 200 mL/hr over 30 Minutes Intravenous  Once 12/22/20 0714 12/22/20 0806         Subjective: No new complaints.  Received HD today.    Nephrology approved of pt receiving regular diet since pt didn't want to eat his meals.   Objective: Vitals:   01/03/21 1230 01/03/21 1247 01/03/21 1335 01/03/21 1616  BP: (!) 120/48 (!) 1'05/56 95/79 97/77 '$  Pulse: (!) 106 (!) 103 100 100  Resp:  '18 19 17  '$ Temp:  97.6 F (36.4 C) 98.7 F (37.1 C) 98.8 F (37.1 C)  TempSrc:  Oral Oral Oral  SpO2:  98% 98% 98%  Weight:  63.2 kg    Height:        Intake/Output Summary (Last 24 hours) at 01/03/2021 1734 Last data filed at 01/03/2021 1631 Gross per 24 hour  Intake 814 ml  Output 4000 ml   Net -3186 ml   Filed Weights   01/03/21 0500 01/03/21 0820 01/03/21 1247  Weight: 69.3 kg 67.2 kg 63.2 kg   Examination:  Constitutional: NAD, AAOx3 HEENT: conjunctivae and lids normal, EOMI CV: No cyanosis.   RESP: normal respiratory effort, on RA  Psych: Normal mood and affect.  Appropriate judgement and reason    Data Reviewed: I have personally reviewed following labs and imaging studies  CBC: Recent Labs  Lab 12/28/20 0222 12/29/20 0035 12/30/20 0158 12/31/20 0100 01/01/21 0030 01/03/21 0657  WBC 18.8* 20.6* 19.4* 22.6* 20.4* 18.1*  NEUTROABS 14.1* 17.1*  --   --   --   --   HGB 9.6* 9.5* 9.2* 9.2* 10.3* 9.8*  HCT 32.4* 31.0* 30.8* 30.4* 34.5* 32.4*  MCV 80.0 79.3* 79.6* 78.8* 78.4* 78.6*  PLT 337 317 330 290 338 [id number]   Basic Metabolic Panel: Recent Labs  Lab 12/28/20 0222 12/29/20 0035 12/30/20 0158 12/31/20 0100 01/01/21 0030 01/03/21 0657  NA 134* 133*  133* 131* 131* 133* 130*  K 3.6 4.1  4.0 4.0 4.6 4.1 4.5  CL 94* 94*  94* 94* 94* 96* 91*  CO2 '27 26  25 25 22 25 22  '$ GLUCOSE 95 93  92 83 77 86 105*  BUN 25* 35*  34* 22* 38* 21* 62*  CREATININE 4.94* 6.51*  6.40* 4.37* 6.47* 4.32* 8.50*  CALCIUM 9.1 9.5  9.6 8.7* 9.4  9.1 9.2  MG 2.1 2.2 2.0 2.0 2.1 2.5*  PHOS 2.3* 3.7  3.7  --   --   --   --    GFR: Estimated Creatinine Clearance: 9.4 mL/min (A) (by C-G formula based on SCr of 8.5 mg/dL (H)). Liver Function Tests: Recent Labs  Lab 12/28/20 0222 12/29/20 0035  AST 25 28  ALT 10 11  ALKPHOS 87 74  BILITOT 0.8 1.9*  PROT 8.3* 8.2*  ALBUMIN 2.2* 2.2*  2.1*   No results for input(s): LIPASE, AMYLASE in the last 168 hours. No results for input(s): AMMONIA in the last 168 hours. Coagulation Profile: No results for input(s): INR, PROTIME in the last 168 hours. Cardiac Enzymes: No results for input(s): CKTOTAL, CKMB, CKMBINDEX, TROPONINI in the last 168 hours. BNP (last 3 results) No results for input(s): PROBNP in the last 8760  hours. HbA1C: No results for input(s): HGBA1C in the last 72 hours. CBG: No results for input(s): GLUCAP in the last 168 hours. Lipid Profile: No results for input(s): CHOL, HDL, LDLCALC, TRIG, CHOLHDL, LDLDIRECT in the last 72 hours. Thyroid Function Tests: No results for input(s): TSH, T4TOTAL, FREET4, T3FREE, THYROIDAB in the last 72 hours. Anemia Panel: No results for input(s): VITAMINB12, FOLATE, FERRITIN, TIBC, IRON, RETICCTPCT in the last 72 hours. Sepsis Labs: Recent Labs  Lab 12/28/20 0222  PROCALCITON 23.98    Recent Results (from the past 240 hour(s))  MRSA PCR Screening     Status: None   Collection Time: 12/25/20 10:18 PM   Specimen: Nasal Mucosa; Nasopharyngeal  Result Value Ref Range Status   MRSA by PCR NEGATIVE NEGATIVE Final    Comment:        The GeneXpert MRSA Assay (FDA approved for NASAL specimens only), is one component of a comprehensive MRSA colonization surveillance program. It is not intended to diagnose MRSA infection nor to guide or monitor treatment for MRSA infections. Performed at [facility], [place] [address], [place], [place] [address]   Culture, blood (routine x 2)     Status: Abnormal   Collection Time: 12/26/20 10:19 AM   Specimen: BLOOD RIGHT HAND  Result Value Ref Range Status   Specimen Description BLOOD RIGHT HAND  Final   Special Requests   Final    BOTTLES DRAWN AEROBIC ONLY Blood Culture results may not be optimal due to an inadequate volume of blood received in culture bottles   Culture  Setup Time   Final    GRAM POSITIVE COCCI IN CLUSTERS AEROBIC BOTTLE ONLY CRITICAL RESULT CALLED TO, READ BACK BY AND VERIFIED WITH: PHARMD TM TUCKER [id number] AT 43 BY CM    Culture (A)  Final    STAPHYLOCOCCUS AUREUS SUSCEPTIBILITIES PERFORMED ON PREVIOUS CULTURE WITHIN THE LAST 5 DAYS. Performed at [facility], [place] [address] [place], [place] [address]    Report Status 12/29/2020 FINAL  Final  Culture, blood (routine x 2)      Status: Abnormal (Preliminary result)   Collection Time: 12/26/20 10:19 AM   Specimen: BLOOD RIGHT WRIST  Result Value Ref Range Status   Specimen Description BLOOD RIGHT WRIST  Final   Special Requests   Final    BOTTLES DRAWN AEROBIC ONLY Blood Culture results may not be optimal due to an inadequate volume of blood received in culture bottles   Culture  Setup Time   Final    GRAM POSITIVE COCCI IN CLUSTERS AEROBIC BOTTLE ONLY CRITICAL RESULT CALLED TO, READ BACK BY AND  VERIFIED WITH: PHARMD Kittie Plater [id number] AT 53 BY CM    Culture (A)  Final    METHICILLIN RESISTANT STAPHYLOCOCCUS AUREUS Sent to [place] for further susceptibility testing. Performed at [facility], [place] [address], [place], [place] [address]    Report Status PENDING  Incomplete   Organism ID, Bacteria METHICILLIN RESISTANT STAPHYLOCOCCUS AUREUS  Final      Susceptibility   Methicillin resistant staphylococcus aureus - MIC*    CIPROFLOXACIN >=8 RESISTANT Resistant     ERYTHROMYCIN >=8 RESISTANT Resistant     GENTAMICIN <=0.5 SENSITIVE Sensitive     OXACILLIN >=4 RESISTANT Resistant     TETRACYCLINE <=1 SENSITIVE Sensitive     VANCOMYCIN 1 SENSITIVE Sensitive     TRIMETH/SULFA <=10 SENSITIVE Sensitive     CLINDAMYCIN <=0.25 SENSITIVE Sensitive     RIFAMPIN <=0.5 SENSITIVE Sensitive     Inducible Clindamycin NEGATIVE Sensitive     * METHICILLIN RESISTANT STAPHYLOCOCCUS AUREUS  Culture, blood (routine x 2)     Status: Abnormal   Collection Time: 12/27/20  2:17 PM   Specimen: BLOOD  Result Value Ref Range Status   Specimen Description BLOOD THUMB  Final   Special Requests   Final    BOTTLES DRAWN AEROBIC AND ANAEROBIC Blood Culture adequate volume   Culture  Setup Time   Final    GRAM POSITIVE COCCI ANAEROBIC BOTTLE ONLY CRITICAL VALUE NOTED.  VALUE IS CONSISTENT WITH PREVIOUSLY REPORTED AND CALLED VALUE.    Culture (A)  Final    STAPHYLOCOCCUS AUREUS SUSCEPTIBILITIES PERFORMED ON PREVIOUS CULTURE  WITHIN THE LAST 5 DAYS. Performed at [facility], [place] [address], [place], [place] [address]    Report Status 01/02/2021 FINAL  Final  Culture, blood (routine x 2)     Status: Abnormal   Collection Time: 12/27/20  2:17 PM   Specimen: BLOOD RIGHT HAND  Result Value Ref Range Status   Specimen Description BLOOD RIGHT HAND  Final   Special Requests   Final    BOTTLES DRAWN AEROBIC AND ANAEROBIC Blood Culture adequate volume   Culture  Setup Time   Final    GRAM POSITIVE COCCI IN CLUSTERS ANAEROBIC BOTTLE ONLY CRITICAL VALUE NOTED.  VALUE IS CONSISTENT WITH PREVIOUSLY REPORTED AND CALLED VALUE. Performed at [facility], [place] [address] [place],  [address]    Culture METHICILLIN RESISTANT STAPHYLOCOCCUS AUREUS (A)  Final   Report Status 12/31/2020 FINAL  Final   Organism ID, Bacteria METHICILLIN RESISTANT STAPHYLOCOCCUS AUREUS  Final      Susceptibility   Methicillin resistant staphylococcus aureus - MIC*    CIPROFLOXACIN >=8 RESISTANT Resistant     ERYTHROMYCIN >=8 RESISTANT Resistant     GENTAMICIN <=0.5 SENSITIVE Sensitive     OXACILLIN >=4 RESISTANT Resistant     TETRACYCLINE <=1 SENSITIVE Sensitive     VANCOMYCIN <=0.5 SENSITIVE Sensitive     TRIMETH/SULFA <=10 SENSITIVE Sensitive     CLINDAMYCIN <=0.25 SENSITIVE Sensitive     RIFAMPIN <=0.5 SENSITIVE Sensitive     Inducible Clindamycin NEGATIVE Sensitive     * METHICILLIN RESISTANT STAPHYLOCOCCUS AUREUS  Culture, blood (routine x 2)     Status: Abnormal   Collection Time: 12/29/20  1:43 PM   Specimen: BLOOD RIGHT HAND  Result Value Ref Range Status   Specimen Description BLOOD RIGHT HAND  Final   Special Requests   Final    BOTTLES DRAWN AEROBIC AND ANAEROBIC Blood Culture adequate volume   Culture  Setup Time   Final    GRAM POSITIVE COCCI ANAEROBIC BOTTLE ONLY CRITICAL VALUE NOTED.  VALUE IS CONSISTENT WITH PREVIOUSLY REPORTED AND CALLED VALUE.    Culture (A)  Final    STAPHYLOCOCCUS  AUREUS SUSCEPTIBILITIES PERFORMED ON PREVIOUS CULTURE WITHIN THE LAST 5 DAYS. Performed at [facility], [place] [address], [place], [place] [address]    Report Status 01/01/2021 FINAL  Final  Culture, blood (routine x 2)     Status: Abnormal   Collection Time: 12/29/20  1:43 PM   Specimen: BLOOD RIGHT HAND  Result Value Ref Range Status   Specimen Description BLOOD RIGHT HAND  Final   Special Requests   Final    BOTTLES DRAWN AEROBIC AND ANAEROBIC Blood Culture adequate volume   Culture  Setup Time   Final    GRAM POSITIVE COCCI IN CLUSTERS ANAEROBIC BOTTLE ONLY CRITICAL VALUE NOTED.  VALUE IS CONSISTENT WITH PREVIOUSLY REPORTED AND CALLED VALUE.    Culture (A)  Final    STAPHYLOCOCCUS AUREUS SUSCEPTIBILITIES PERFORMED ON PREVIOUS CULTURE WITHIN THE LAST 5 DAYS.    Report Status 01/03/2021 FINAL  Final  Culture, blood (routine x 2)     Status: None (Preliminary result)   Collection Time: 12/31/20  8:48 AM   Specimen: BLOOD RIGHT HAND  Result Value Ref Range Status   Specimen Description BLOOD RIGHT HAND  Final   Special Requests   Final    BOTTLES DRAWN AEROBIC ONLY Blood Culture results may not be optimal due to an excessive volume of blood received in culture bottles   Culture   Final    NO GROWTH 3 DAYS Performed at [facility], [place] [address] [place], [place] [address]    Report Status PENDING  Incomplete  Culture, blood (routine x 2)     Status: None (Preliminary result)   Collection Time: 12/31/20  8:48 AM   Specimen: BLOOD RIGHT FOREARM  Result Value Ref Range Status   Specimen Description BLOOD RIGHT FOREARM  Final   Special Requests   Final    BOTTLES DRAWN AEROBIC ONLY Blood Culture adequate volume   Culture   Final    NO GROWTH 3 DAYS Performed at [facility], [place] [address] [place],  [address]    Report Status PENDING  Incomplete    RN Pressure Injury Documentation:     Estimated body mass index is 18.9 kg/m as calculated from  the following:   Height as of this encounter: 6' (1.829 m).   Weight as of this encounter: 63.2 kg.  Malnutrition Type:   Malnutrition Characteristics:   Nutrition Interventions:   Radiology Studies: No results found. Scheduled Meds: . (feeding supplement) PROSource Plus  30 mL Oral BID BM  . aspirin EC  325 mg Oral Daily  . Chlorhexidine Gluconate Cloth  6 each Topical Q0600  . cinacalcet  60 mg Oral Q supper  . darbepoetin (ARANESP) injection - DIALYSIS  100 mcg Intravenous Q Mon-HD  . diltiazem  120 mg Oral Daily  . docusate sodium  100 mg Oral BID  . enoxaparin (LOVENOX) injection  30 mg Subcutaneous Q24H  . feeding supplement (NEPRO CARB STEADY)  237 mL Oral TID BM  . methocarbamol  500 mg Oral TID  . metoprolol tartrate  50 mg Oral BID  . multivitamin with minerals  1 tablet Oral Daily  . oxyCODONE  10 mg Oral Q12H  . pantoprazole  40 mg Oral Daily  .  pregabalin  75 mg Oral QHS  . senna-docusate  1 tablet Oral BID  . sodium chloride flush  3 mL Intravenous Q12H   Continuous Infusions: . gentamicin 90 mg (01/03/21 1439)  . vancomycin 750 mg (01/03/21 1509)    LOS: 12 days   Enzo Bi, md [facility] PAGER is on [place]  If 7PM-7AM, please contact night-coverage [email or web address]

## 2021-01-04 DIAGNOSIS — R7881 Bacteremia: Secondary | ICD-10-CM | POA: Diagnosis not present

## 2021-01-04 DIAGNOSIS — B9561 Methicillin susceptible Staphylococcus aureus infection as the cause of diseases classified elsewhere: Secondary | ICD-10-CM | POA: Diagnosis not present

## 2021-01-04 LAB — CBC
HCT: 36.2 % — ABNORMAL LOW (ref 39.0–52.0)
Hemoglobin: 10.7 g/dL — ABNORMAL LOW (ref 13.0–17.0)
MCH: 23.7 pg — ABNORMAL LOW (ref 26.0–34.0)
MCHC: 29.6 g/dL — ABNORMAL LOW (ref 30.0–36.0)
MCV: 80.3 fL (ref 80.0–100.0)
Platelets: 367 10*3/uL (ref 150–400)
RBC: 4.51 MIL/uL (ref 4.22–5.81)
RDW: 21.9 % — ABNORMAL HIGH (ref 11.5–15.5)
WBC: 24.6 10*3/uL — ABNORMAL HIGH (ref 4.0–10.5)
nRBC: 0 % (ref 0.0–0.2)

## 2021-01-04 LAB — CULTURE, BLOOD (ROUTINE X 2)

## 2021-01-04 MED ORDER — CHLORHEXIDINE GLUCONATE CLOTH 2 % EX PADS
6.0000 | MEDICATED_PAD | Freq: Every day | CUTANEOUS | Status: DC
  Administered 2021-01-04 – 2021-01-05 (×2): 6 via TOPICAL

## 2021-01-04 MED ORDER — RIFAMPIN 300 MG PO CAPS
300.0000 mg | ORAL_CAPSULE | Freq: Three times a day (TID) | ORAL | Status: DC
  Administered 2021-01-04 – 2021-01-06 (×8): 300 mg via ORAL
  Filled 2021-01-04 (×9): qty 1

## 2021-01-04 MED ORDER — RENA-VITE PO TABS
1.0000 | ORAL_TABLET | Freq: Every day | ORAL | Status: DC
  Administered 2021-01-06 (×2): 1 via ORAL
  Filled 2021-01-04 (×2): qty 1

## 2021-01-04 NOTE — Progress Notes (Addendum)
PROGRESS NOTE    Patrick Anthony  [id number] DOB: 09-21-1970 DOA: 12/22/2020 PCP: [name], MD   Brief Narrative:  The patient is a 14 atrial African-American male with a past medical history significant for but not limited to end-stage renal disease on hemodialysis on Monday Wednesday Friday, chronic diastolic CHF, hyperlipidemia, history of liver cirrhosis secondary to hepatitis C, history of alcohol abuse, OSA as well as recent AKA done by Dr. Sharol Given on 10/29/2020 and other comorbidities who presented with a chief complaint of spiking fevers for last few days and then altered mental status.  Reportedly patient was at his facility when they were noticing that he was altered and history is difficult to be obtained from the patient himself.  Of note he has had a prolonged hospitalization from 09/27/20 until November 11, 2019 he was found to have MRSA bacteremia as well as mitral valve endocarditis with PFO and septic emboli to the brain along with that time.  He underwent a bioprosthetic mitral valve replacement along with closure of the PFO on 10/14/2020.  Also during that hospitalization he was treated for osteomyelitis and abscess of the right tibia status post AKA on 10/29/2020 by Dr. Sharol Given.  He was discharged to [facility] for continuation of vancomycin at the nursing facility until 11/25/2020.  Reportedly he is usually alert and oriented x4.  Yesterday he is only alert and oriented to person and place but not the situation.  He complained about severe pain in his back has been present for some time and he describes shooting pains in the right leg but was amputated.  He denies any shortness breath or cough.  In the ED he presented with a fever of 103 and rales were reported in the lower lung field on the right lung.  Upon arrival to the ED he was noted to be septic given that he had an elevated temperature, respirations as well as being tachycardia and had a new O2 requirement as well as being altered.   CT scan of the brain showed no acute abnormalities but did show several changes that were chronic injury to the areas of infarct noted on the previous MRI.  His WBC was elevated 20,200.  Chest x-ray showed scarring in the right upper lobe, middle lobe atelectasis and a small left-sided pleural effusion stable cardiomegaly.  Initial sepsis protocol was initiated and he was given IV vancomycin and cefepime.  Further work-up reveals that he has another MRSA bacteremia and infectious diseases is involved again and nephrology is involved as well.  Patient is to get a repeat TEE and this was done today 12/24/20 to evaluate his mitral valve. Still pending to get a MRI the T-spine with and without contrast with hemodialysis after.  UnFortunately a TEE shows likely bioprosthetic valve infection so infectious disease is now going to add gentamicin and rifampin once his cultures are cleared.  We will still pursue his thoracic spine MRI which is still pending to be done.  Overnight the patient developed blurred vision and he underwent a head MRI which showed recurrent cardioembolic strokes in the setting of septic emboli from infective endocarditis.  Neurology was consulted and they did not recommend further work-up given that he recently just had an MRI of the brain with and without contrast on 09/28/2020 as well as an echo and carotid Dopplers.  Neurology recommending holding antiplatelets given the risk of hemorrhagic conversion of recurrent infective endocarditis related strokes and resume in 1 week if patient remains stable.  Patient's aspirin was then discontinued.  MRI of the spine shows early discitis-osteomyelitis at the T10-T11 level with mild perivertebral soft tissue edema at this level and no evidence of epidural fluid collection or abscess.  There is also moderate endplate marrow edema centered at C6-7 intervertebral disc space similar to previous MRI and small bilateral pleural effusion.  Cardiothoracic  surgery evaluated and they feel that he is a candidate for redo mitral valve replacement and aortic valve replacement 3 months following previous surgery as they do not feel that he would survive this.  They recommend continuing antibiotic therapy.  On the morning of 12/26/20 he went into SVT and heart rates spiked in the 150s and he was given at 250 mL bolus, tried vagal maneuvers which did not break his rhythm and he was given Tylenol for his temperature of 102.8.  Cardiology was consulted and he was given adenosine 6 mg and then it looked like 2-1 flutter and he was placed on a diltiazem drip and bolus.  Cardiology feels that he cannot be anticoagulated this time given his risk for hemorrhagic conversion.  Further review of telemetry showed that his heart rate may just represent sinus tachycardia and as there is no clear flutter waves and now cardiology transition him to oral diltiazem.  He Continues to spike temperatures and next hemodialysis session per nephrology.  Palliative care consulted and current goals of care discussion is still aggressive and full care p.o. 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 No LAA thrombus. Negative for PFO" --cardiothoracic surgery feels that he is not a candidate for redoing his surgical intervention --blood cx has not cleared Plan: --cont IV vanc and gentamicin, per ID --add rifampin today for prosthetic valve endocarditis.  --Plan to treat Vancomycin and Rifampin for 6 weeks and Gentamicin for 2 weeks from date of negative blood cultures on 5/27 --Monitor CBC and CMP on IV antibiotics --Baseline audiometry and monitor for signs of ototoxicity with gentamicin --OK to place  a PICC line for long-term IV antibiotics --A follow-up with [facility] will be made upon discharge  Acute CVAs recurrent cardioembolic strokes  in the setting of septic emboli from infective endocarditis -MRI of the brain showed "Numerous punctate foci of abnormal diffusion restriction within both hemispheres in multiple vascular territories, consistent with acute infarcts.  Multiple scattered microhemorrhages in a nonspecific pattern." -Neurology was consulted and patient already had most of the stroke work-up done in the last few admissions and they did not recommend repeating echocardiogram as he had a TEE confirming infective endocarditis -Neurology  recommended holding antiplatelets at the time given risk of hemorrhagic conversion -DVT prophylaxis changed from heparin to SCDs and recommending resuming in 1 week -Neurology recommended continue telemetry monitoring and a 30-day event monitor on discharge if no arrhythmias were captured --cont home ASA 325 and DVT ppx  Elevated troponin 2/2 demand ischemia -Troponin was elevated and went to 774 and likely in the setting of demand ischemia in the setting of his acute illness with bacteremia and tachyarrhythmias; he did have normal coronary  arteries in 10/2020  Arrhythmia/new onset SVT -Cardiology evaluated and gave the patient 6 mg of adenosine which slowed his rhythm and it looked like a 2-1 a flutter block so they started him on diltiazem drip -Once his arrhythmia improved and his heart rate slowed down cardiology reviewed his telemetry and they felt that it was not a flutter and may just represent sinus tachycardia -It is unclear if he did have atrial flutter but anticoagulation was not started given multiple cardioembolic events from his infective endocarditis. Plan: --cont cardizem and lopressor  Hypotension, resolved -In the setting of MRSA bacteremia his admission blood pressures were as low as 84/51 but improved with IV fluid resuscitation  HTN -His home blood pressure medications include clonidine 0.2 mg p.o. twice daily as well as metoprolol tartrate 50 mg p.o. twice daily  Plan: --cont cardizem and lopressor --Hold clonidine  Acute metabolic encephalopathy in the setting of infection, resolved -He is noted to be alert and oriented x2 but was previously alert and oriented x4 -CT scan did not show any acute abnormalities; he does have a prior history of mitral valve endocarditis with septic emboli to the brain and to the lung  ESRD on Hemodialysis Monday Wednesday  Secondary hyperparathyroidism -Patient has a history of poorly cystic kidney disease --iHD per nephrology --regular diet, approved by nephrology  Hyponatremia, mild --stable  Acute Respiratory Failure with Hypoxia, resolved -Was placed on 2 L supplemental oxygen via nasal cannula and does not wear supplemental oxygen at home -Chest x-ray showed "Lower lung volumes with atelectasis. Chronic cardiomegaly and right lung  Scarring." -Patient does have a history of MRSA bacteremia with septic emboli to the lungs --weaned down to RA  History of MRSA bacteremia with mitral valve endocarditis and PFO and septic emboli to the brain and left lung -He  previously completed antibiotics on 11/25/2020 but now is back with another bacteremia as above so is now going to be on IV Vancomycin, IV Gentamicin, and IV Rifampin once Cx's clear   Anemia of chronic kidney disease --Hgb 9's --Monitor Hgb and transfuse to keep Hgb >7  Hypokalemia --monitor and replete with oral potassium PRN  Chronic Pain Syndrome with Chronic Opiate Dependence -He reports chronic back pain as well as right lower extremity that was recently amputated --cont oxycontin to 10 mg q12h, taper as tolerated  Status post right AKA -Recently had an AKA done in the last 2 months -Continue with Pregabalin 75 mg p.o. nightly  Liver cirrhosis secondary to chronic hepatitis C -His HCC quantitative was 1,750,000 on 09/29/2020 -Infectious diseases been consulted   GERD -cont PPI  Hyperbilirubinemia, mild --monitor  hypoalbumin --albumin 2.2   DVT prophylaxis: lovenox lower dose, per pharm Code Status: FULL CODE  Family Communication:   Status is: Inpatient  Dispo: The patient is from: Home              Anticipated d/c is to: Home  Patient currently is not medically stable to d/c.  ID has just today set abx plan and duration after first set of blood cx neg.  Can now plan for discharge.    Difficult to place patient No  Consultants:   Infectious Diseases  Nephrology  Cardiology   Neurology  Cardiothoracic Surgery  Palliative Care   Procedures:  TEE done by Dr. Debara Pickett  Findings:  1. LEFT VENTRICLE: The left ventricular wall thickness is moderately increased.  The left ventricular cavity is normal in size. Wall motion is normal.  LVEF is 60-65%.  2. RIGHT VENTRICLE:  The right ventricle is severely dilated without any thrombus or masses.    3. LEFT ATRIUM:  The left atrium is normal in size without any thrombus or masses.  There is not spontaneous echo contrast ("smoke") in the left atrium consistent with a low flow state.  4. LEFT  ATRIAL APPENDAGE:  The large left atrial appendage is free of any thrombus or masses. The appendage has single lobes. Pulse doppler indicates high flow in the appendage.  5. ATRIAL SEPTUM:  The atrial septum appears intact and is free of thrombus and/or masses.  There is no evidence for interatrial shunting by color doppler and saline microbubble.  6. RIGHT ATRIUM:  The right atrium is severely dilated in size and function without any thrombus or masses.  7. 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 There is  trivial regurgitation.  There is a possible small mobile vegetation adherent to the posterior leaflet which appears filamentous, this could also be residual valve tissue or suture.   8. AORTIC VALVE:  The aortic valve is trileaflet, normal in structure and function with no regurgitation.  The valve was visualized with 2D/3D modes - there appears to be a small mobile density at the base of the left coronary cusp seen in the long-axis view on the outflow side of the leaflet, possibly consistent with vegetation.  9. TRICUSPID VALVE:  The tricuspid valve is normal in structure and function with Mild regurgitation.  There were no vegetations or stenosis  10.  PULMONIC VALVE:  The pulmonic valve is normal in structure and function with trivial regurgitation.  There were no vegetations or stenosis.   11. AORTIC ARCH, ASCENDING AND DESCENDING AORTA:  There was grade 2 Ron Parker et. Al, 1992) atherosclerosis of the ascending aorta, aortic arch, or proximal descending aorta.  12. PULMONARY VEINS: Anomalous pulmonary venous return was not noted.  13. PERICARDIUM: The pericardium appeared normal and non-thickened.  There is no pericardial effusion.  IMPRESSION:   1. 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  2. No LAA thrombus 3. Negative for PFO 4. Moderate LVH 5. Severe RA/RV dilitation with mild  TR 6. LVEF 60-65%  RECOMMENDATIONS:    1. Consider longer course of antibiotics for possible valvular vegetations, including a small, mobile filamentous structure on the posterior bioprosthetic valve leaflet which is concerning for vegetation.  Antimicrobials:  Anti-infectives (From admission, onward)   Start     Dose/Rate Route Frequency Ordered Stop   01/04/21 1400  rifampin (RIFADIN) capsule 300 mg        300 mg Oral Every 8 hours 01/04/21 0946     01/03/21 1400  gentamicin (GARAMYCIN) 90 mg in dextrose 5 % 50 mL IVPB        90 mg 104.5 mL/hr over 30 Minutes Intravenous Every M-W-F 01/03/21 1021  12/31/20 1152  vancomycin (VANCOREADY) 750 MG/150ML IVPB       Note to Pharmacy: Gaynelle Adu  : cabinet override      12/31/20 1152 12/31/20 1218   12/29/20 1400  gentamicin (GARAMYCIN) IVPB 100 mg  Status:  Discontinued        100 mg 200 mL/hr over 30 Minutes Intravenous Every M-W-F 12/29/20 1312 01/03/21 1021   12/29/20 0932  vancomycin (VANCOREADY) 750 MG/150ML IVPB       Note to Pharmacy: Judieth Keens  : cabinet override      12/29/20 0932 12/29/20 1114   12/27/20 1800  gentamicin (GARAMYCIN) IVPB 80 mg  Status:  Discontinued        80 mg 100 mL/hr over 30 Minutes Intravenous Every M-W-F (1800) 12/24/20 1612 12/29/20 1312   12/27/20 0813  vancomycin (VANCOREADY) 750 MG/150ML IVPB  Status:  Discontinued       Note to Pharmacy: Louretta Shorten   : cabinet override      12/27/20 0813 12/27/20 0815   12/24/20 1700  gentamicin (GARAMYCIN) IVPB 120 mg        120 mg 200 mL/hr over 30 Minutes Intravenous  Once 12/24/20 1612 12/24/20 1910   12/24/20 1430  vancomycin (VANCOCIN) IVPB 750 mg/150 ml premix        750 mg 150 mL/hr over 60 Minutes Intravenous Every M-W-F (Hemodialysis) 12/24/20 1324     12/22/20 1201  vancomycin variable dose per unstable renal function (pharmacist dosing)  Status:  Discontinued         Does not apply See admin instructions 12/22/20 1202  12/24/20 1324   12/22/20 1200  vancomycin (VANCOREADY) IVPB 750 mg/150 mL  Status:  Discontinued        750 mg 150 mL/hr over 60 Minutes Intravenous Every M-W-F (Hemodialysis) 12/22/20 0845 12/22/20 1202   12/22/20 1200  ceFEPIme (MAXIPIME) 2 g in sodium chloride 0.9 % 100 mL IVPB  Status:  Discontinued        2 g 200 mL/hr over 30 Minutes Intravenous Every M-W-F (Hemodialysis) 12/22/20 0845 12/23/20 1450   12/22/20 0715  vancomycin (VANCOREADY) IVPB 1500 mg/300 mL        1,500 mg 150 mL/hr over 120 Minutes Intravenous  Once 12/22/20 0714 12/22/20 1034   12/22/20 0715  ceFEPIme (MAXIPIME) 2 g in sodium chloride 0.9 % 100 mL IVPB        2 g 200 mL/hr over 30 Minutes Intravenous  Once 12/22/20 0714 12/22/20 0806         Subjective: Pt reported an episode of sweating and feeling dizzy, which resolved.  Pt believed his plastic pillow was making him too hot.    Pt liked his regular diet much better.   Objective: Vitals:   01/03/21 2127 01/04/21 0435 01/04/21 0500 01/04/21 1400  BP: 101/79 108/82  103/70  Pulse: 98 85  86  Resp: '17 18  16  '$ Temp: 97.8 F (36.6 C) 98 F (36.7 C)  98.6 F (37 C)  TempSrc: Oral Oral  Oral  SpO2: 95% 99%  95%  Weight:   64.6 kg   Height:        Intake/Output Summary (Last 24 hours) at 01/04/2021 1927 Last data filed at 01/04/2021 1401 Gross per 24 hour  Intake 676.49 ml  Output --  Net 676.49 ml   Filed Weights   01/03/21 0820 01/03/21 1247 01/04/21 0500  Weight: 67.2 kg 63.2 kg 64.6 kg   Examination:  Constitutional: NAD, AAOx3  HEENT: conjunctivae and lids normal, EOMI CV: No cyanosis.   RESP: normal respiratory effort, on RA Extremities: No effusions, edema in LLE.  Right AKA. SKIN: warm, dry Neuro: II - XII grossly intact.   Psych: Normal mood and affect.  Appropriate judgement and reason   Data Reviewed: I have personally reviewed following labs and imaging studies  CBC: Recent Labs  Lab 12/29/20 0035 12/30/20 0158  12/31/20 0100 01/01/21 0030 01/03/21 0657 01/04/21 0040  WBC 20.6* 19.4* 22.6* 20.4* 18.1* 24.6*  NEUTROABS 17.1*  --   --   --   --   --   HGB 9.5* 9.2* 9.2* 10.3* 9.8* 10.7*  HCT 31.0* 30.8* 30.4* 34.5* 32.4* 36.2*  MCV 79.3* 79.6* 78.8* 78.4* 78.6* 80.3  PLT 317 330 290 338 379 [id number]   Basic Metabolic Panel: Recent Labs  Lab 12/29/20 0035 12/30/20 0158 12/31/20 0100 01/01/21 0030 01/03/21 0657  NA 133*  133* 131* 131* 133* 130*  K 4.1  4.0 4.0 4.6 4.1 4.5  CL 94*  94* 94* 94* 96* 91*  CO2 '26  25 25 22 25 22  '$ GLUCOSE 93  92 83 77 86 105*  BUN 35*  34* 22* 38* 21* 62*  CREATININE 6.51*  6.40* 4.37* 6.47* 4.32* 8.50*  CALCIUM 9.5  9.6 8.7* 9.4 9.1 9.2  MG 2.2 2.0 2.0 2.1 2.5*  PHOS 3.7  3.7  --   --   --   --    GFR: Estimated Creatinine Clearance: 9.6 mL/min (A) (by C-G formula based on SCr of 8.5 mg/dL (H)). Liver Function Tests: Recent Labs  Lab 12/29/20 0035  AST 28  ALT 11  ALKPHOS 74  BILITOT 1.9*  PROT 8.2*  ALBUMIN 2.2*  2.1*   No results for input(s): LIPASE, AMYLASE in the last 168 hours. No results for input(s): AMMONIA in the last 168 hours. Coagulation Profile: No results for input(s): INR, PROTIME in the last 168 hours. Cardiac Enzymes: No results for input(s): CKTOTAL, CKMB, CKMBINDEX, TROPONINI in the last 168 hours. BNP (last 3 results) No results for input(s): PROBNP in the last 8760 hours. HbA1C: No results for input(s): HGBA1C in the last 72 hours. CBG: No results for input(s): GLUCAP in the last 168 hours. Lipid Profile: No results for input(s): CHOL, HDL, LDLCALC, TRIG, CHOLHDL, LDLDIRECT in the last 72 hours. Thyroid Function Tests: No results for input(s): TSH, T4TOTAL, FREET4, T3FREE, THYROIDAB in the last 72 hours. Anemia Panel: No results for input(s): VITAMINB12, FOLATE, FERRITIN, TIBC, IRON, RETICCTPCT in the last 72 hours. Sepsis Labs: No results for input(s): PROCALCITON, LATICACIDVEN in the last 168  hours.  Recent Results (from the past 240 hour(s))  MRSA PCR Screening     Status: None   Collection Time: 12/25/20 10:18 PM   Specimen: Nasal Mucosa; Nasopharyngeal  Result Value Ref Range Status   MRSA by PCR NEGATIVE NEGATIVE Final    Comment:        The GeneXpert MRSA Assay (FDA approved for NASAL specimens only), is one component of a comprehensive MRSA colonization surveillance program. It is not intended to diagnose MRSA infection nor to guide or monitor treatment for MRSA infections. Performed at [facility], [place] [address] [place], [place] [address]   Culture, blood (routine x 2)     Status: Abnormal   Collection Time: 12/26/20 10:19 AM   Specimen: BLOOD RIGHT HAND  Result Value Ref Range Status   Specimen Description BLOOD RIGHT HAND  Final  Special Requests   Final    BOTTLES DRAWN AEROBIC ONLY Blood Culture results may not be optimal due to an inadequate volume of blood received in culture bottles   Culture  Setup Time   Final    GRAM POSITIVE COCCI IN CLUSTERS AEROBIC BOTTLE ONLY CRITICAL RESULT CALLED TO, READ BACK BY AND VERIFIED WITH: PHARMD TM TUCKER [id number] AT 51 BY CM    Culture (A)  Final    STAPHYLOCOCCUS AUREUS SUSCEPTIBILITIES PERFORMED ON PREVIOUS CULTURE WITHIN THE LAST 5 DAYS. Performed at [facility], [place] [address], [place], [place] [address]    Report Status 12/29/2020 FINAL  Final  Culture, blood (routine x 2)     Status: Abnormal   Collection Time: 12/26/20 10:19 AM   Specimen: BLOOD RIGHT WRIST  Result Value Ref Range Status   Specimen Description BLOOD RIGHT WRIST  Final   Special Requests   Final    BOTTLES DRAWN AEROBIC ONLY Blood Culture results may not be optimal due to an inadequate volume of blood received in culture bottles   Culture  Setup Time   Final    GRAM POSITIVE COCCI IN CLUSTERS AEROBIC BOTTLE ONLY CRITICAL RESULT CALLED TO, READ BACK BY AND VERIFIED WITH: PHARMD M TUCKER [id number] AT 46 BY CM    Culture  (A)  Final    METHICILLIN RESISTANT STAPHYLOCOCCUS AUREUS SEE SEPARATE REPORT Performed at [facility], [place] [address] [place], [place] [address]    Report Status 01/04/2021 FINAL  Final   Organism ID, Bacteria METHICILLIN RESISTANT STAPHYLOCOCCUS AUREUS  Final      Susceptibility   Methicillin resistant staphylococcus aureus - MIC*    CIPROFLOXACIN >=8 RESISTANT Resistant     ERYTHROMYCIN >=8 RESISTANT Resistant     GENTAMICIN <=0.5 SENSITIVE Sensitive     OXACILLIN >=4 RESISTANT Resistant     TETRACYCLINE <=1 SENSITIVE Sensitive     VANCOMYCIN 1 SENSITIVE Sensitive     TRIMETH/SULFA <=10 SENSITIVE Sensitive     CLINDAMYCIN <=0.25 SENSITIVE Sensitive     RIFAMPIN <=0.5 SENSITIVE Sensitive     Inducible Clindamycin NEGATIVE Sensitive     * METHICILLIN RESISTANT STAPHYLOCOCCUS AUREUS  Culture, blood (routine x 2)     Status: Abnormal   Collection Time: 12/27/20  2:17 PM   Specimen: BLOOD  Result Value Ref Range Status   Specimen Description BLOOD THUMB  Final   Special Requests   Final    BOTTLES DRAWN AEROBIC AND ANAEROBIC Blood Culture adequate volume   Culture  Setup Time   Final    GRAM POSITIVE COCCI ANAEROBIC BOTTLE ONLY CRITICAL VALUE NOTED.  VALUE IS CONSISTENT WITH PREVIOUSLY REPORTED AND CALLED VALUE.    Culture (A)  Final    STAPHYLOCOCCUS AUREUS SUSCEPTIBILITIES PERFORMED ON PREVIOUS CULTURE WITHIN THE LAST 5 DAYS. Performed at [facility], [place] [address], [place], [place] [address]    Report Status 01/02/2021 FINAL  Final  Culture, blood (routine x 2)     Status: Abnormal   Collection Time: 12/27/20  2:17 PM   Specimen: BLOOD RIGHT HAND  Result Value Ref Range Status   Specimen Description BLOOD RIGHT HAND  Final   Special Requests   Final    BOTTLES DRAWN AEROBIC AND ANAEROBIC Blood Culture adequate volume   Culture  Setup Time   Final    GRAM POSITIVE COCCI IN CLUSTERS ANAEROBIC BOTTLE ONLY CRITICAL VALUE NOTED.  VALUE IS CONSISTENT WITH  PREVIOUSLY REPORTED AND CALLED  VALUE. Performed at [facility], [place] [address] [place], [place] [address]    Culture METHICILLIN RESISTANT STAPHYLOCOCCUS AUREUS (A)  Final   Report Status 12/31/2020 FINAL  Final   Organism ID, Bacteria METHICILLIN RESISTANT STAPHYLOCOCCUS AUREUS  Final      Susceptibility   Methicillin resistant staphylococcus aureus - MIC*    CIPROFLOXACIN >=8 RESISTANT Resistant     ERYTHROMYCIN >=8 RESISTANT Resistant     GENTAMICIN <=0.5 SENSITIVE Sensitive     OXACILLIN >=4 RESISTANT Resistant     TETRACYCLINE <=1 SENSITIVE Sensitive     VANCOMYCIN <=0.5 SENSITIVE Sensitive     TRIMETH/SULFA <=10 SENSITIVE Sensitive     CLINDAMYCIN <=0.25 SENSITIVE Sensitive     RIFAMPIN <=0.5 SENSITIVE Sensitive     Inducible Clindamycin NEGATIVE Sensitive     * METHICILLIN RESISTANT STAPHYLOCOCCUS AUREUS  Culture, blood (routine x 2)     Status: Abnormal   Collection Time: 12/29/20  1:43 PM   Specimen: BLOOD RIGHT HAND  Result Value Ref Range Status   Specimen Description BLOOD RIGHT HAND  Final   Special Requests   Final    BOTTLES DRAWN AEROBIC AND ANAEROBIC Blood Culture adequate volume   Culture  Setup Time   Final    GRAM POSITIVE COCCI ANAEROBIC BOTTLE ONLY CRITICAL VALUE NOTED.  VALUE IS CONSISTENT WITH PREVIOUSLY REPORTED AND CALLED VALUE.    Culture (A)  Final    STAPHYLOCOCCUS AUREUS SUSCEPTIBILITIES PERFORMED ON PREVIOUS CULTURE WITHIN THE LAST 5 DAYS. Performed at [facility], [place] [address], [place], [place] [address]    Report Status 01/01/2021 FINAL  Final  Culture, blood (routine x 2)     Status: Abnormal   Collection Time: 12/29/20  1:43 PM   Specimen: BLOOD RIGHT HAND  Result Value Ref Range Status   Specimen Description BLOOD RIGHT HAND  Final   Special Requests   Final    BOTTLES DRAWN AEROBIC AND ANAEROBIC Blood Culture adequate volume   Culture  Setup Time   Final    GRAM POSITIVE COCCI IN CLUSTERS ANAEROBIC BOTTLE  ONLY CRITICAL VALUE NOTED.  VALUE IS CONSISTENT WITH PREVIOUSLY REPORTED AND CALLED VALUE.    Culture (A)  Final    STAPHYLOCOCCUS AUREUS SUSCEPTIBILITIES PERFORMED ON PREVIOUS CULTURE WITHIN THE LAST 5 DAYS.    Report Status 01/03/2021 FINAL  Final  Culture, blood (routine x 2)     Status: None (Preliminary result)   Collection Time: 12/31/20  8:48 AM   Specimen: BLOOD RIGHT HAND  Result Value Ref Range Status   Specimen Description BLOOD RIGHT HAND  Final   Special Requests   Final    BOTTLES DRAWN AEROBIC ONLY Blood Culture results may not be optimal due to an excessive volume of blood received in culture bottles   Culture   Final    NO GROWTH 4 DAYS Performed at [facility], [place] [address], [place],  [address]    Report Status PENDING  Incomplete  Culture, blood (routine x 2)     Status: None (Preliminary result)   Collection Time: 12/31/20  8:48 AM   Specimen: BLOOD RIGHT FOREARM  Result Value Ref Range Status   Specimen Description BLOOD RIGHT FOREARM  Final   Special Requests   Final    BOTTLES DRAWN AEROBIC ONLY Blood Culture adequate volume   Culture   Final    NO GROWTH 4 DAYS Performed at [facility], [place],  [place] [address]    Report Status PENDING  Incomplete    RN Pressure Injury Documentation:     Estimated body mass index is 19.32 kg/m as calculated from the following:   Height as of this encounter: 6' (1.829 m).   Weight as of this encounter: 64.6 kg.  Malnutrition Type:   Malnutrition Characteristics:   Nutrition Interventions:   Radiology Studies: No results found. Scheduled Meds: . (feeding supplement) PROSource Plus  30 mL Oral BID BM  . aspirin EC  325 mg Oral Daily  . Chlorhexidine Gluconate Cloth  6 each Topical Q0600  . cinacalcet  60 mg Oral Q supper  . darbepoetin (ARANESP) injection - DIALYSIS  100 mcg Intravenous Q Mon-HD  . diltiazem  120 mg Oral Daily  . docusate sodium  100 mg Oral BID  .  enoxaparin (LOVENOX) injection  30 mg Subcutaneous Q24H  . feeding supplement (NEPRO CARB STEADY)  237 mL Oral TID BM  . methocarbamol  500 mg Oral TID  . metoprolol tartrate  50 mg Oral BID  . [START ON 01/05/2021] multivitamin  1 tablet Oral QHS  . oxyCODONE  10 mg Oral Q12H  . pantoprazole  40 mg Oral Daily  . pregabalin  75 mg Oral QHS  . rifampin  300 mg Oral Q8H  . senna-docusate  1 tablet Oral BID  . sodium chloride flush  3 mL Intravenous Q12H   Continuous Infusions: . sodium chloride    . sodium chloride    . gentamicin Stopped (01/03/21 2256)  . vancomycin Stopped (01/03/21 2256)    LOS: 13 days   Enzo Bi, md [facility] PAGER is on [place]  If 7PM-7AM, please contact night-coverage [email or web address]

## 2021-01-04 NOTE — Progress Notes (Signed)
[facility] Progress Note   Subjective:   Reports dialysis went well yesterday, tolerated 4L UF. Denies SOB, CP, palpitations, dizziness, abdominal pain and nausea today.   Objective Vitals:   01/03/21 2040 01/03/21 2127 01/04/21 0435 01/04/21 0500  BP: 103/74 101/79 108/82   Pulse:  98 85   Resp: '16 17 18   '$ Temp: (!) 97.3 F (36.3 C) 97.8 F (36.6 C) 98 F (36.7 C)   TempSrc: Oral Oral Oral   SpO2: 96% 95% 99%   Weight:    64.6 kg  Height:       Physical Exam General: WDWN male, alert and in NAD Heart: RRR, no murmur auscultated Lungs: CTA bilaterally without wheezing, rhonchi or rales Abdomen: Soft, non-tender, non-distended, +BS Extremities: R AKA, no edema LLE Dialysis Access: LUE AVG + thrill  Additional Objective Labs: Basic Metabolic Panel: Recent Labs  Lab 12/29/20 0035 12/30/20 0158 12/31/20 0100 01/01/21 0030 01/03/21 0657  NA 133*  133*   < > 131* 133* 130*  K 4.1  4.0   < > 4.6 4.1 4.5  CL 94*  94*   < > 94* 96* 91*  CO2 26  25   < > '22 25 22  '$ GLUCOSE 93  92   < > 77 86 105*  BUN 35*  34*   < > 38* 21* 62*  CREATININE 6.51*  6.40*   < > 6.47* 4.32* 8.50*  CALCIUM 9.5  9.6   < > 9.4 9.1 9.2  PHOS 3.7  3.7  --   --   --   --    < > = values in this interval not displayed.   Liver Function Tests: Recent Labs  Lab 12/29/20 0035  AST 28  ALT 11  ALKPHOS 74  BILITOT 1.9*  PROT 8.2*  ALBUMIN 2.2*  2.1*   CBC: Recent Labs  Lab 12/29/20 0035 12/30/20 0158 12/31/20 0100 01/01/21 0030 01/03/21 0657 01/04/21 0040  WBC 20.6* 19.4* 22.6* 20.4* 18.1* 24.6*  NEUTROABS 17.1*  --   --   --   --   --   HGB 9.5* 9.2* 9.2* 10.3* 9.8* 10.7*  HCT 31.0* 30.8* 30.4* 34.5* 32.4* 36.2*  MCV 79.3* 79.6* 78.8* 78.4* 78.6* 80.3  PLT 317 330 290 338 379 367   Blood Culture    Component Value Date/Time   SDES BLOOD RIGHT HAND 12/31/2020 0848   SDES BLOOD RIGHT FOREARM 12/31/2020 0848   SPECREQUEST  12/31/2020 0848    BOTTLES  DRAWN AEROBIC ONLY Blood Culture results may not be optimal due to an excessive volume of blood received in culture bottles   SPECREQUEST  12/31/2020 0848    BOTTLES DRAWN AEROBIC ONLY Blood Culture adequate volume   CULT  12/31/2020 0848    NO GROWTH 4 DAYS Performed at [facility], [address], [place], [place] [address]    CULT  12/31/2020 0848    NO GROWTH 4 DAYS Performed at [facility], [place] [address], [place], [place] [address]    REPTSTATUS PENDING 12/31/2020 0848   REPTSTATUS PENDING 12/31/2020 0848   Medications: . sodium chloride    . sodium chloride    . gentamicin Stopped (01/03/21 2256)  . vancomycin Stopped (01/03/21 2256)   . (feeding supplement) PROSource Plus  30 mL Oral BID BM  . aspirin EC  325 mg Oral Daily  . Chlorhexidine Gluconate Cloth  6 each Topical Q0600  . cinacalcet  60 mg  Oral Q supper  . darbepoetin (ARANESP) injection - DIALYSIS  100 mcg Intravenous Q Mon-HD  . diltiazem  120 mg Oral Daily  . docusate sodium  100 mg Oral BID  . enoxaparin (LOVENOX) injection  30 mg Subcutaneous Q24H  . feeding supplement (NEPRO CARB STEADY)  237 mL Oral TID BM  . heparin  5,000 Units Dialysis Once in dialysis  . methocarbamol  500 mg Oral TID  . metoprolol tartrate  50 mg Oral BID  . multivitamin with minerals  1 tablet Oral Daily  . oxyCODONE  10 mg Oral Q12H  . pantoprazole  40 mg Oral Daily  . pregabalin  75 mg Oral QHS  . rifampin  300 mg Oral Q8H  . senna-docusate  1 tablet Oral BID  . sodium chloride flush  3 mL Intravenous Q12H     Outpatient Dialysis Orders: MWF -[facility] 4hrs, BFR350, DFR500, EDW 72kg,2K/2CaProfile 2  Access:LU AVG vs BCF Heparin5500 unit qHD Mircera255mg q2wks - last 5/9 Venofer '100mg'$  qHD x5 - 1 completed  Assessment/Plan: 1. 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  2. 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 BC+ MRSA. On Vancomycin and Gent per pharmacy. Repeat TEE with MV vegetations. MRI thoracic spine concerning for osteo/discitis T10/11. ID followingand recommending 6 week course of antibiotics after first day of bacteremia clearance (5/27).  3. Back pain -MRI lumbar spine with no discitis/osteomyelitis, +degenerative disc disease at L4-L5, L5-S1. Per primary team 4. Hypoxia-resolved but req O2 [place] at night.  5. ESRD- On HD MWF. Continue on schedule.   6. Hypertension/volume-BP controlled. Tolerating 3-4L UF goals, well below his last outpatient EDW of 72kg. Will need new dry weight at discharge. 7. Anemiaof CKD- Hgb10.7-continue weekly aranesp. Hold IV iron due to acute infection. 8. Secondary Hyperparathyroidism -Corrected calcium running high, not on VDRAContinue sensipar '60mg'$  qd and low calcium bath. Phosphorus binders d/c, phos remains at goal.  9. Nutrition- Renal diet w/fluid restriction. Protein supplement.   SAnice Paganini PA-C 01/04/2021, 10:33 AM  [facility] Pager: [phone]

## 2021-01-04 NOTE — Progress Notes (Addendum)
[facility] Infectious Diseases Follow Up Note  Patient Identification: Patient Name: Patrick Anthony MRN: [id number] [place] Date: 12/22/2020  6:57 AM Age: 50 y.o.Today's Date: 01/04/2021   Reason for Visit: PV endocarditis   Principal Problem:   Bacteremia due to Staphylococcus aureus Active Problems:   Anemia in chronic kidney disease   Hepatitis C   ESRD on dialysis ([place])   S/P mitral valve replacement   S/P AKA (above knee amputation), right (HCC)   Personal history of Methicillin resistant Staphylococcus aureus infection   SIRS (systemic inflammatory response syndrome) (HCC)   Antibiotics:Vancomycin 5/18-current Gentamicin 5/20-current   Lines/Tubes:Left upper arm AV graft,PIV's   Interval Events: Remains afebrile, uptrending leukocytosis to 24.6.  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 Staph aureus is sensitive to rifampin Plan to treat Vancomycin and Rifampin for 6 weeks and Gentamicin for 2 weeks  from date of negative blood cultures on 5/27 Monitor CBC and CMP on IV antibiotics Baseline audiometry and monitor for signs of ototoxicity with gentamicin OK to place  a PICC line for long-term IV antibiotics A follow-up with [facility] will be made upon discharge Please call with questions or concerns  Rest of the management as per the primary team. Thank you for the consult. Please page with pertinent questions or  concerns.  ______________________________________________________________________ Subjective patient seen and examined at the bedside.  Clinically improving.  No new complaints  Vitals BP 108/82 (BP Location: Right Wrist)   Pulse 85   Temp 98 F (36.7 C) (Oral)   Resp 18   Ht 6' (1.829 m)   Wt 64.6 kg   SpO2 99%   BMI 19.32 kg/m     Physical Exam Constitutional:   Not in acute distress, sitting up in bed comfortably    Comments:   Cardiovascular:     Rate and Rhythm: Normal rate and regular rhythm.     Heart sounds: Systolic murmur-no change  Pulmonary:     Effort: Pulmonary effort is normal.     Comments: No respiratory distress  Abdominal:     Palpations: Abdomen is soft.     Tenderness: Nondistended and nontender  Musculoskeletal:        General: No swelling or tenderness.   Skin:    Comments: No lesions or rashes  Neurological:     General: No focal deficit present.   Psychiatric:        Mood and Affect: Mood normal.   Pertinent Microbiology Results for orders placed or performed during the hospital encounter of 12/22/20  Resp Panel by RT-PCR (Flu A&B, Covid) Nasopharyngeal Swab     Status: None   Collection Time: 12/22/20  7:14 AM   Specimen: Nasopharyngeal Swab; Nasopharyngeal(NP) swabs in vial transport medium  Result Value Ref Range Status   SARS Coronavirus 2 by RT PCR NEGATIVE NEGATIVE Final    Comment: (NOTE) SARS-CoV-2 target nucleic acids are NOT DETECTED.  The SARS-CoV-2 RNA is generally detectable in upper respiratory specimens during the acute phase of infection. The lowest concentration of SARS-CoV-2 viral copies this assay can detect is 138 copies/mL.  A negative result does not preclude SARS-Cov-2 infection and should not be used as the sole basis for treatment or other patient management decisions. A negative result may occur with  improper specimen collection/handling, submission of specimen other than nasopharyngeal swab, presence  of viral mutation(s) within the areas targeted by this assay, and inadequate number of viral copies(<138 copies/mL). A negative result must be combined with clinical observations, patient history, and epidemiological information. The expected result is Negative.  Fact Sheet for Patients:  EntrepreneurPulse.com.au  Fact Sheet for Healthcare Providers:  IncredibleEmployment.be  This test is no t yet approved or cleared by the [place] FDA and  has been authorized for detection and/or diagnosis of SARS-CoV-2 by FDA under an Emergency Use Authorization (EUA). This EUA will remain  in effect (meaning this test can be used) for the duration of the COVID-19 declaration under Section 564(b)(1) of the Act, 21 U.S.C.section 360bbb-3(b)(1), unless the authorization is terminated  or revoked sooner.       Influenza A by PCR NEGATIVE NEGATIVE Final   Influenza B by PCR NEGATIVE NEGATIVE Final    Comment: (NOTE) The Xpert Xpress SARS-CoV-2/FLU/RSV plus assay is intended as an aid in the diagnosis of influenza from Nasopharyngeal swab specimens and should not be used as a sole basis for treatment. Nasal washings and aspirates are unacceptable for Xpert Xpress SARS-CoV-2/FLU/RSV testing.  Fact Sheet for Patients: EntrepreneurPulse.com.au  Fact Sheet for Healthcare Providers: IncredibleEmployment.be  This test is not yet approved or cleared by the [place] FDA and has been authorized for detection and/or diagnosis of SARS-CoV-2 by FDA under an Emergency Use Authorization (EUA). This EUA will remain in effect (meaning this test can be used) for the duration of the COVID-19 declaration under Section 564(b)(1) of the Act, 21 U.S.C. section 360bbb-3(b)(1), unless the authorization is terminated or revoked.  Performed at [facility], [place] [address] [place], [place] [address]   Blood Culture (routine x 2)      Status: Abnormal   Collection Time: 12/22/20  7:14 AM   Specimen: BLOOD  Result Value Ref Range Status   Specimen Description BLOOD SITE NOT SPECIFIED  Final   Special Requests   Final    BOTTLES DRAWN AEROBIC AND ANAEROBIC Blood Culture adequate volume   Culture  Setup Time   Final    GRAM POSITIVE COCCI IN CLUSTERS IN BOTH AEROBIC AND ANAEROBIC BOTTLES CRITICAL RESULT CALLED TO, READ BACK BY AND VERIFIED WITHDion Body [facility] 2028 12/22/20 A BROWNING Performed at [facility], [place] [address], [place], [place] [address]    Culture METHICILLIN RESISTANT STAPHYLOCOCCUS AUREUS (A)  Final   Report Status 12/24/2020 FINAL  Final   Organism ID, Bacteria METHICILLIN RESISTANT STAPHYLOCOCCUS AUREUS  Final      Susceptibility   Methicillin resistant staphylococcus aureus - MIC*    CIPROFLOXACIN 4 RESISTANT Resistant     ERYTHROMYCIN >=8 RESISTANT Resistant     GENTAMICIN <=0.5 SENSITIVE Sensitive     OXACILLIN >=4 RESISTANT Resistant     TETRACYCLINE <=1 SENSITIVE Sensitive     VANCOMYCIN 1 SENSITIVE Sensitive     TRIMETH/SULFA <=10 SENSITIVE Sensitive     CLINDAMYCIN <=0.25 SENSITIVE Sensitive     RIFAMPIN <=0.5 SENSITIVE Sensitive     Inducible Clindamycin NEGATIVE Sensitive     * METHICILLIN RESISTANT STAPHYLOCOCCUS AUREUS  Blood Culture ID Panel (Reflexed)     Status: Abnormal   Collection Time: 12/22/20  7:14 AM  Result Value Ref  Range Status   Enterococcus faecalis NOT DETECTED NOT DETECTED Final   Enterococcus Faecium NOT DETECTED NOT DETECTED Final   Listeria monocytogenes NOT DETECTED NOT DETECTED Final   Staphylococcus species DETECTED (A) NOT DETECTED Final    Comment: CRITICAL RESULT CALLED TO, READ BACK BY AND VERIFIED WITH: Dion Body PHARMD 2028 12/22/20 A BROWNING    Staphylococcus aureus ([facility]) DETECTED (A) NOT DETECTED Final    Comment: Methicillin (oxacillin)-resistant Staphylococcus aureus (MRSA). MRSA is predictably resistant to beta-lactam antibiotics  (except ceftaroline). Preferred therapy is vancomycin unless clinically contraindicated. Patient requires contact precautions if  hospitalized. CRITICAL RESULT CALLED TO, READ BACK BY AND VERIFIED WITH: Dion Body PHARMD 2028 12/22/20 A BROWNING    Staphylococcus epidermidis NOT DETECTED NOT DETECTED Final   Staphylococcus lugdunensis NOT DETECTED NOT DETECTED Final   Streptococcus species NOT DETECTED NOT DETECTED Final   Streptococcus agalactiae NOT DETECTED NOT DETECTED Final   Streptococcus pneumoniae NOT DETECTED NOT DETECTED Final   Streptococcus pyogenes NOT DETECTED NOT DETECTED Final   A.calcoaceticus-baumannii NOT DETECTED NOT DETECTED Final   Bacteroides fragilis NOT DETECTED NOT DETECTED Final   Enterobacterales NOT DETECTED NOT DETECTED Final   Enterobacter cloacae complex NOT DETECTED NOT DETECTED Final   Escherichia coli NOT DETECTED NOT DETECTED Final   Klebsiella aerogenes NOT DETECTED NOT DETECTED Final   Klebsiella oxytoca NOT DETECTED NOT DETECTED Final   Klebsiella pneumoniae NOT DETECTED NOT DETECTED Final   Proteus species NOT DETECTED NOT DETECTED Final   Salmonella species NOT DETECTED NOT DETECTED Final   Serratia marcescens NOT DETECTED NOT DETECTED Final   Haemophilus influenzae NOT DETECTED NOT DETECTED Final   Neisseria meningitidis NOT DETECTED NOT DETECTED Final   Pseudomonas aeruginosa NOT DETECTED NOT DETECTED Final   Stenotrophomonas maltophilia NOT DETECTED NOT DETECTED Final   Candida albicans NOT DETECTED NOT DETECTED Final   Candida auris NOT DETECTED NOT DETECTED Final   Candida glabrata NOT DETECTED NOT DETECTED Final   Candida krusei NOT DETECTED NOT DETECTED Final   Candida parapsilosis NOT DETECTED NOT DETECTED Final   Candida tropicalis NOT DETECTED NOT DETECTED Final   Cryptococcus neoformans/gattii NOT DETECTED NOT DETECTED Final   Meth resistant mecA/C and MREJ DETECTED (A) NOT DETECTED Final    Comment: CRITICAL RESULT CALLED TO, READ  BACK BY AND VERIFIED WITHDion Body [facility] 2028 12/22/20 A BROWNING Performed at [facility], [address], [place], [place] [address]   Blood Culture (routine x 2)     Status: Abnormal   Collection Time: 12/22/20  7:19 AM   Specimen: BLOOD  Result Value Ref Range Status   Specimen Description BLOOD SITE NOT SPECIFIED  Final   Special Requests   Final    BOTTLES DRAWN AEROBIC AND ANAEROBIC Blood Culture results may not be optimal due to an excessive volume of blood received in culture bottles   Culture  Setup Time   Final    GRAM POSITIVE COCCI IN CLUSTERS IN BOTH AEROBIC AND ANAEROBIC BOTTLES IDENTIFICATION TO FOLLOW CRITICAL RESULT CALLED TO, READ BACK BY AND VERIFIED WITH: J MILLEN PHARMD 2028 12/22/20 A BROWNING    Culture (A)  Final    STAPHYLOCOCCUS AUREUS SUSCEPTIBILITIES PERFORMED ON PREVIOUS CULTURE WITHIN THE LAST 5 DAYS. Performed at [facility], [place] [address] [place], [place] [address]    Report Status 12/24/2020 FINAL  Final  Culture, blood (routine x 2)     Status: None   Collection Time: 12/24/20  3:10 PM  Specimen: BLOOD RIGHT HAND  Result Value Ref Range Status   Specimen Description BLOOD RIGHT HAND  Final   Special Requests   Final    BOTTLES DRAWN AEROBIC ONLY Blood Culture adequate volume   Culture   Final    NO GROWTH 5 DAYS Performed at [facility], [address], [place], [place] [address]    Report Status 12/29/2020 FINAL  Final  Culture, blood (routine x 2)     Status: None   Collection Time: 12/24/20  3:37 PM   Specimen: BLOOD  Result Value Ref Range Status   Specimen Description BLOOD BLOOD RIGHT FOREARM  Final   Special Requests   Final    BOTTLES DRAWN AEROBIC ONLY Blood Culture adequate volume   Culture   Final    NO GROWTH 5 DAYS Performed at [facility], [place] [address] [place], [place] [address]    Report Status 12/29/2020 FINAL  Final  MRSA PCR Screening     Status: None   Collection Time: 12/25/20  10:18 PM   Specimen: Nasal Mucosa; Nasopharyngeal  Result Value Ref Range Status   MRSA by PCR NEGATIVE NEGATIVE Final    Comment:        The GeneXpert MRSA Assay (FDA approved for NASAL specimens only), is one component of a comprehensive MRSA colonization surveillance program. It is not intended to diagnose MRSA infection nor to guide or monitor treatment for MRSA infections. Performed at [facility], [place] [address] [place], [place] [address]   Culture, blood (routine x 2)     Status: Abnormal   Collection Time: 12/26/20 10:19 AM   Specimen: BLOOD RIGHT HAND  Result Value Ref Range Status   Specimen Description BLOOD RIGHT HAND  Final   Special Requests   Final    BOTTLES DRAWN AEROBIC ONLY Blood Culture results may not be optimal due to an inadequate volume of blood received in culture bottles   Culture  Setup Time   Final    GRAM POSITIVE COCCI IN CLUSTERS AEROBIC BOTTLE ONLY CRITICAL RESULT CALLED TO, READ BACK BY AND VERIFIED WITH: PHARMD TM TUCKER [id number] AT 65 BY CM    Culture (A)  Final    STAPHYLOCOCCUS AUREUS SUSCEPTIBILITIES PERFORMED ON PREVIOUS CULTURE WITHIN THE LAST 5 DAYS. Performed at [facility], [place] [address] [place], [place] [address]    Report Status 12/29/2020 FINAL  Final  Culture, blood (routine x 2)     Status: Abnormal (Preliminary result)   Collection Time: 12/26/20 10:19 AM   Specimen: BLOOD RIGHT WRIST  Result Value Ref Range Status   Specimen Description BLOOD RIGHT WRIST  Final   Special Requests   Final    BOTTLES DRAWN AEROBIC ONLY Blood Culture results may not be optimal due to an inadequate volume of blood received in culture bottles   Culture  Setup Time   Final    GRAM POSITIVE COCCI IN CLUSTERS AEROBIC BOTTLE ONLY CRITICAL RESULT CALLED TO, READ BACK BY AND VERIFIED WITH: PHARMD M TUCKER [id number] AT 7 BY CM    Culture (A)  Final    METHICILLIN RESISTANT STAPHYLOCOCCUS AUREUS Sent to [place] for further  susceptibility testing. Performed at [facility], [place] [address] [place], [place] [address]    Report Status PENDING  Incomplete   Organism ID, Bacteria METHICILLIN RESISTANT STAPHYLOCOCCUS AUREUS  Final      Susceptibility   Methicillin resistant staphylococcus aureus - MIC*  CIPROFLOXACIN >=8 RESISTANT Resistant     ERYTHROMYCIN >=8 RESISTANT Resistant     GENTAMICIN <=0.5 SENSITIVE Sensitive     OXACILLIN >=4 RESISTANT Resistant     TETRACYCLINE <=1 SENSITIVE Sensitive     VANCOMYCIN 1 SENSITIVE Sensitive     TRIMETH/SULFA <=10 SENSITIVE Sensitive     CLINDAMYCIN <=0.25 SENSITIVE Sensitive     RIFAMPIN <=0.5 SENSITIVE Sensitive     Inducible Clindamycin NEGATIVE Sensitive     * METHICILLIN RESISTANT STAPHYLOCOCCUS AUREUS  Culture, blood (routine x 2)     Status: Abnormal   Collection Time: 12/27/20  2:17 PM   Specimen: BLOOD  Result Value Ref Range Status   Specimen Description BLOOD THUMB  Final   Special Requests   Final    BOTTLES DRAWN AEROBIC AND ANAEROBIC Blood Culture adequate volume   Culture  Setup Time   Final    GRAM POSITIVE COCCI ANAEROBIC BOTTLE ONLY CRITICAL VALUE NOTED.  VALUE IS CONSISTENT WITH PREVIOUSLY REPORTED AND CALLED VALUE.    Culture (A)  Final    STAPHYLOCOCCUS AUREUS SUSCEPTIBILITIES PERFORMED ON PREVIOUS CULTURE WITHIN THE LAST 5 DAYS. Performed at [facility], [place] [address], [place], [place] [address]    Report Status 01/02/2021 FINAL  Final  Culture, blood (routine x 2)     Status: Abnormal   Collection Time: 12/27/20  2:17 PM   Specimen: BLOOD RIGHT HAND  Result Value Ref Range Status   Specimen Description BLOOD RIGHT HAND  Final   Special Requests   Final    BOTTLES DRAWN AEROBIC AND ANAEROBIC Blood Culture adequate volume   Culture  Setup Time   Final    GRAM POSITIVE COCCI IN CLUSTERS ANAEROBIC BOTTLE ONLY CRITICAL VALUE NOTED.  VALUE IS CONSISTENT WITH PREVIOUSLY REPORTED AND CALLED VALUE. Performed at [facility], [place] [address], [place], [place] [address]    Culture METHICILLIN RESISTANT STAPHYLOCOCCUS AUREUS (A)  Final   Report Status 12/31/2020 FINAL  Final   Organism ID, Bacteria METHICILLIN RESISTANT STAPHYLOCOCCUS AUREUS  Final      Susceptibility   Methicillin resistant staphylococcus aureus - MIC*    CIPROFLOXACIN >=8 RESISTANT Resistant     ERYTHROMYCIN >=8 RESISTANT Resistant     GENTAMICIN <=0.5 SENSITIVE Sensitive     OXACILLIN >=4 RESISTANT Resistant     TETRACYCLINE <=1 SENSITIVE Sensitive     VANCOMYCIN <=0.5 SENSITIVE Sensitive     TRIMETH/SULFA <=10 SENSITIVE Sensitive     CLINDAMYCIN <=0.25 SENSITIVE Sensitive     RIFAMPIN <=0.5 SENSITIVE Sensitive     Inducible Clindamycin NEGATIVE Sensitive     * METHICILLIN RESISTANT STAPHYLOCOCCUS AUREUS  Culture, blood (routine x 2)     Status: Abnormal   Collection Time: 12/29/20  1:43 PM   Specimen: BLOOD RIGHT HAND  Result Value Ref Range Status   Specimen Description BLOOD RIGHT HAND  Final   Special Requests   Final    BOTTLES DRAWN AEROBIC AND ANAEROBIC Blood Culture adequate volume   Culture  Setup Time   Final    GRAM POSITIVE COCCI ANAEROBIC BOTTLE ONLY CRITICAL VALUE NOTED.  VALUE IS CONSISTENT WITH PREVIOUSLY REPORTED AND CALLED VALUE.    Culture (A)  Final    STAPHYLOCOCCUS AUREUS SUSCEPTIBILITIES PERFORMED ON PREVIOUS CULTURE WITHIN THE LAST 5 DAYS. Performed at [facility], [place] [address], [place], [place] [address]    Report Status 01/01/2021 FINAL  Final  Culture, blood (routine x 2)  Status: Abnormal   Collection Time: 12/29/20  1:43 PM   Specimen: BLOOD RIGHT HAND  Result Value Ref Range Status   Specimen Description BLOOD RIGHT HAND  Final   Special Requests   Final    BOTTLES DRAWN AEROBIC AND ANAEROBIC Blood Culture adequate volume   Culture  Setup Time   Final    GRAM POSITIVE COCCI IN CLUSTERS ANAEROBIC BOTTLE ONLY CRITICAL VALUE NOTED.  VALUE IS CONSISTENT WITH PREVIOUSLY REPORTED  AND CALLED VALUE.    Culture (A)  Final    STAPHYLOCOCCUS AUREUS SUSCEPTIBILITIES PERFORMED ON PREVIOUS CULTURE WITHIN THE LAST 5 DAYS.    Report Status 01/03/2021 FINAL  Final  Culture, blood (routine x 2)     Status: None (Preliminary result)   Collection Time: 12/31/20  8:48 AM   Specimen: BLOOD RIGHT HAND  Result Value Ref Range Status   Specimen Description BLOOD RIGHT HAND  Final   Special Requests   Final    BOTTLES DRAWN AEROBIC ONLY Blood Culture results may not be optimal due to an excessive volume of blood received in culture bottles   Culture   Final    NO GROWTH 4 DAYS Performed at [facility], [place] [address] [place], [place] [address]    Report Status PENDING  Incomplete  Culture, blood (routine x 2)     Status: None (Preliminary result)   Collection Time: 12/31/20  8:48 AM   Specimen: BLOOD RIGHT FOREARM  Result Value Ref Range Status   Specimen Description BLOOD RIGHT FOREARM  Final   Special Requests   Final    BOTTLES DRAWN AEROBIC ONLY Blood Culture adequate volume   Culture   Final    NO GROWTH 4 DAYS Performed at [facility], [place] [address], [place], [place] [address]    Report Status PENDING  Incomplete    Pertinent Lab. CBC Latest Ref Rng & Units 01/04/2021 01/03/2021 01/01/2021  WBC 4.0 - 10.5 K/uL 24.6(H) 18.1(H) 20.4(H)  Hemoglobin 13.0 - 17.0 g/dL 10.7(L) 9.8(L) 10.3(L)  Hematocrit 39.0 - 52.0 % 36.2(L) 32.4(L) 34.5(L)  Platelets 150 - 400 K/uL 367 379 338   CMP Latest Ref Rng & Units 01/03/2021 01/01/2021 12/31/2020  Glucose 70 - 99 mg/dL 105(H) 86 77  BUN 6 - 20 mg/dL 62(H) 21(H) 38(H)  Creatinine 0.61 - 1.24 mg/dL 8.50(H) 4.32(H) 6.47(H)  Sodium 135 - 145 mmol/L 130(L) 133(L) 131(L)  Potassium 3.5 - 5.1 mmol/L 4.5 4.1 4.6  Chloride 98 - 111 mmol/L 91(L) 96(L) 94(L)  CO2 22 - 32 mmol/L '22 25 22  '$ Calcium 8.9 - 10.3 mg/dL 9.2 9.1 9.4  Total Protein 6.5 - 8.1 g/dL - - -  Total Bilirubin 0.3 - 1.2 mg/dL - - -  Alkaline Phos 38 - 126  U/L - - -  AST 15 - 41 U/L - - -  ALT 0 - 44 U/L - - -     Pertinent Imaging today Plain films and CT images have been personally visualized and interpreted; radiology reports have been reviewed. Decision making incorporated into the Impression / Recommendations.  I have spent more than 35 minutes for this patient encounter including review of prior medical records, coordination of care  with greater than 50% of time being face to face/counseling and discussing diagnostics/treatment plan with the patient/family.  Electronically signed by:   Rosiland Oz, MD Infectious Disease [facility]ician [facility] for Infectious Disease Pager: [phone]

## 2021-01-05 DIAGNOSIS — N186 End stage renal disease: Secondary | ICD-10-CM | POA: Diagnosis not present

## 2021-01-05 DIAGNOSIS — R7881 Bacteremia: Secondary | ICD-10-CM | POA: Diagnosis not present

## 2021-01-05 DIAGNOSIS — B182 Chronic viral hepatitis C: Secondary | ICD-10-CM | POA: Diagnosis not present

## 2021-01-05 DIAGNOSIS — Z952 Presence of prosthetic heart valve: Secondary | ICD-10-CM | POA: Diagnosis not present

## 2021-01-05 LAB — CBC WITH DIFFERENTIAL/PLATELET
Abs Immature Granulocytes: 0 10*3/uL (ref 0.00–0.07)
Basophils Absolute: 0 10*3/uL (ref 0.0–0.1)
Basophils Relative: 0 %
Eosinophils Absolute: 0 10*3/uL (ref 0.0–0.5)
Eosinophils Relative: 0 %
HCT: 35 % — ABNORMAL LOW (ref 39.0–52.0)
Hemoglobin: 10.8 g/dL — ABNORMAL LOW (ref 13.0–17.0)
Lymphocytes Relative: 5 %
Lymphs Abs: 1.6 10*3/uL (ref 0.7–4.0)
MCH: 23.8 pg — ABNORMAL LOW (ref 26.0–34.0)
MCHC: 30.9 g/dL (ref 30.0–36.0)
MCV: 77.1 fL — ABNORMAL LOW (ref 80.0–100.0)
Monocytes Absolute: 1.9 10*3/uL — ABNORMAL HIGH (ref 0.1–1.0)
Monocytes Relative: 6 %
Neutro Abs: 27.6 10*3/uL — ABNORMAL HIGH (ref 1.7–7.7)
Neutrophils Relative %: 89 %
Platelets: 456 10*3/uL — ABNORMAL HIGH (ref 150–400)
RBC: 4.54 MIL/uL (ref 4.22–5.81)
RDW: 21.8 % — ABNORMAL HIGH (ref 11.5–15.5)
WBC: 31 10*3/uL — ABNORMAL HIGH (ref 4.0–10.5)
nRBC: 0 % (ref 0.0–0.2)
nRBC: 0 /100 WBC

## 2021-01-05 LAB — MAGNESIUM: Magnesium: 2.3 mg/dL (ref 1.7–2.4)

## 2021-01-05 LAB — BASIC METABOLIC PANEL
Anion gap: 15 (ref 5–15)
BUN: 67 mg/dL — ABNORMAL HIGH (ref 6–20)
CO2: 23 mmol/L (ref 22–32)
Calcium: 9.2 mg/dL (ref 8.9–10.3)
Chloride: 90 mmol/L — ABNORMAL LOW (ref 98–111)
Creatinine, Ser: 8.03 mg/dL — ABNORMAL HIGH (ref 0.61–1.24)
GFR, Estimated: 8 mL/min — ABNORMAL LOW (ref >60)
Glucose, Bld: 81 mg/dL (ref 70–99)
Potassium: 5.1 mmol/L (ref 3.5–5.1)
Sodium: 128 mmol/L — ABNORMAL LOW (ref 135–145)

## 2021-01-05 LAB — CULTURE, BLOOD (ROUTINE X 2)
Culture: NO GROWTH
Culture: NO GROWTH
Special Requests: ADEQUATE

## 2021-01-05 LAB — SEDIMENTATION RATE: Sed Rate: 38 mm/hr — ABNORMAL HIGH (ref 0–16)

## 2021-01-05 MED ORDER — OXYCODONE HCL 5 MG PO TABS
ORAL_TABLET | ORAL | Status: AC
  Administered 2021-01-05: 10 mg via ORAL
  Filled 2021-01-05: qty 2

## 2021-01-05 MED ORDER — ENOXAPARIN SODIUM 30 MG/0.3ML IJ SOSY
30.0000 mg | PREFILLED_SYRINGE | INTRAMUSCULAR | Status: DC
  Administered 2021-01-06: 30 mg via SUBCUTANEOUS
  Filled 2021-01-05: qty 0.3

## 2021-01-05 MED ORDER — VANCOMYCIN HCL 750 MG/150ML IV SOLN
INTRAVENOUS | Status: AC
  Administered 2021-01-05: 750 mg
  Filled 2021-01-05: qty 150

## 2021-01-05 NOTE — Progress Notes (Signed)
[facility] Progress Note   Subjective:   Pt seen in room, no new concerns this AM. Denies SOB, CP, palpitations, dizziness, abdominal pain and nausea. Planned for HD today.   Objective Vitals:   01/04/21 1400 01/04/21 2107 01/05/21 0523 01/05/21 0858  BP: 103/70 113/83 109/84 108/89  Pulse: 86 100 100 95  Resp: '16 15 19   '$ Temp: 98.6 F (37 C) 98.7 F (37.1 C) 98.9 F (37.2 C)   TempSrc: Oral Axillary Axillary   SpO2: 95% 97% 97%   Weight:      Height:       Physical Exam General:WDWN male, alert and in NAD Heart:RRR, no murmur auscultated Lungs:CTA bilaterally without wheezing, rhonchi or rales Abdomen:Soft, non-tender, non-distended, +BS Extremities:R AKA, no edema LLE Dialysis Access:LUE AVG + thrill   Additional Objective Labs: Basic Metabolic Panel: Recent Labs  Lab 01/01/21 0030 01/03/21 0657 01/05/21 0821  NA 133* 130* 128*  K 4.1 4.5 5.1  CL 96* 91* 90*  CO2 '25 22 23  '$ GLUCOSE 86 105* 81  BUN 21* 62* 67*  CREATININE 4.32* 8.50* 8.03*  CALCIUM 9.1 9.2 9.2   CBC: Recent Labs  Lab 12/30/20 0158 12/31/20 0100 01/01/21 0030 01/03/21 0657 01/04/21 0040  WBC 19.4* 22.6* 20.4* 18.1* 24.6*  HGB 9.2* 9.2* 10.3* 9.8* 10.7*  HCT 30.8* 30.4* 34.5* 32.4* 36.2*  MCV 79.6* 78.8* 78.4* 78.6* 80.3  PLT 330 290 338 379 367   Blood Culture    Component Value Date/Time   SDES BLOOD RIGHT HAND 12/31/2020 0848   SDES BLOOD RIGHT FOREARM 12/31/2020 0848   SPECREQUEST  12/31/2020 0848    BOTTLES DRAWN AEROBIC ONLY Blood Culture results may not be optimal due to an excessive volume of blood received in culture bottles   SPECREQUEST  12/31/2020 0848    BOTTLES DRAWN AEROBIC ONLY Blood Culture adequate volume   CULT  12/31/2020 0848    NO GROWTH 5 DAYS Performed at [facility], [address], [place], [place] [address]    CULT  12/31/2020 0848    NO GROWTH 5 DAYS Performed at [facility], [place] [address] [place], [place]  [address]    REPTSTATUS 01/05/2021 FINAL 12/31/2020 0848   REPTSTATUS 01/05/2021 FINAL 12/31/2020 0848   Medications: . sodium chloride    . sodium chloride    . gentamicin Stopped (01/03/21 2256)  . vancomycin Stopped (01/03/21 2256)   . (feeding supplement) PROSource Plus  30 mL Oral BID BM  . aspirin EC  325 mg Oral Daily  . Chlorhexidine Gluconate Cloth  6 each Topical Q0600  . cinacalcet  60 mg Oral Q supper  . darbepoetin (ARANESP) injection - DIALYSIS  100 mcg Intravenous Q Mon-HD  . diltiazem  120 mg Oral Daily  . docusate sodium  100 mg Oral BID  . enoxaparin (LOVENOX) injection  30 mg Subcutaneous Q24H  . feeding supplement (NEPRO CARB STEADY)  237 mL Oral TID BM  . methocarbamol  500 mg Oral TID  . metoprolol tartrate  50 mg Oral BID  . multivitamin  1 tablet Oral QHS  . oxyCODONE  10 mg Oral Q12H  . pantoprazole  40 mg Oral Daily  . pregabalin  75 mg Oral QHS  . rifampin  300 mg Oral Q8H  . senna-docusate  1 tablet Oral BID  . sodium chloride flush  3 mL Intravenous Q12H    Dialysis Orders: MWF -South 4hrs, BFR350, DFR500, EDW 72kg,2K/2CaProfile 2  Access:LU AVG vs BCF Heparin5500 unit qHD Mircera278mg q2wks - last 5/9 Venofer '100mg'$  qHD x5 - 1 completed  Assessment/Plan: 1. 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  2. 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 On Vancomycin and Gent per pharmacy.Repeat TEE with MV vegetations. MRI thoracic spine concerning for osteo/discitis T10/11. ID followingand recommending 6 week course of antibiotics after first day of bacteremia clearance (5/27). 3. Back pain -MRI lumbar spine with no discitis/osteomyelitis, +degenerative disc disease at L4-L5, L5-S1.Per primary team 4. Hypoxia-resolved but req O2 [place] at night.  5. ESRD- On HD MWF. Continue on schedule.   Heparin initially held for 1 week per neuro recommendations, will resume PRN 6. Hypertension/volume-BP controlled. Tolerating 3-4L UF goals, well below his last outpatient EDW of 72kg. Will need new dry weight at discharge. 7. Anemiaof CKD- Hgb10.7-continue weekly aranesp. Hold IV iron due to acute infection. 8. Secondary Hyperparathyroidism -Corrected calcium running high, not on VDRAContinue sensipar '60mg'$  qdand low calcium bath. Phosphorus binders d/c, phos remains at goal. 9. Nutrition- Renal diet w/fluid restriction. Protein supplement.  SAnice Paganini PA-C 01/05/2021, 10:09 AM  [facility] Pager: [phone]

## 2021-01-05 NOTE — TOC Progression Note (Signed)
Transition of Care [facility]) - Progression Note    Patient Details  Name: Patrick Anthony MRN: [id number] Date of Birth: 09-Mar-1971  Transition of Care [facility]) CM/SW [place], [place] Phone Number: 01/05/2021, 9:28 AM  Clinical Narrative:    CSW spoke with [facility] and provided an update on discharge antibiotics. Lorenso Courier will begin insurance authorization.   Expected Discharge Plan: Skilled Nursing Facility Barriers to Discharge: Continued Medical Work up  Expected Discharge Plan and Services Expected Discharge Plan: [place] arrangements for the past 2 months: [place]                                       Social Determinants of Health (SDOH) Interventions    Readmission Risk Interventions Readmission Risk Prevention Plan 11/05/2020  Transportation Screening Complete  Medication Review [employer]) Complete  PCP or Specialist appointment within 3-5 days of discharge Complete  HRI or Home Care Consult Complete  SW Recovery Care/Counseling Consult Complete  Palliative Care Screening Complete  Skilled Nursing Facility Complete  Some recent data might be hidden

## 2021-01-05 NOTE — Progress Notes (Addendum)
PROGRESS NOTE        PATIENT DETAILS Name: Patrick Anthony Age: 50 y.o. Sex: male Date of Birth: February 04, 1971 Admit Date: 12/22/2020 Admitting Physician Norval Morton, MD [id number], Myra Rude, MD  Brief Narrative: Patient is a 50 y.o. 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  See below for further details.  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  Discharged to SNF. 5/18>> admit with fever/confusion-blood cultures positive for MRSA 5/20>> TEE: vegetation on mitral valve prosthesis, aortic valve vegetation. 5/21>> MRI brain positive for embolic infarcts [id number] MRI T-spine-positive for discitis at T10-T11 level 5/22>> CT surgery eval-not a candidate for redo mitral valve replacement/aortic valve replacement.  Significant studies: 5/18>> chest x-ray: No pneumonia. 5/18>> CT head: No acute intracranial abnormality 5/18>> MRI LS spine: No discitis/osteomyelitis/abscess 5/20>> TEE: EF 60-65%, vegetation on mitral valve prosthesis, aortic valve vegetation. 5/21>> MRI brain: Multiple embolic infarcts [id number] MRI T-spine: Early discitis/osteo at T10-11 level.  Antimicrobial therapy: Cefepime: 5/18 x 1 Vancomycin: 5/18>> Gentamicin: 5/20>> Rifampin: 5/31>>  Microbiology data: 5/22>> blood culture: MRSA 5/23>> blood culture: MRSA 5/25>> blood culture: MRSA 5/27>> blood culture: No  growth  Procedures : 5/20>> TEE: vegetation on mitral valve prosthesis, aortic valve vegetation.  Consults: Palliative care, cardiology, ID, cardiothoracic surgery  DVT Prophylaxis : enoxaparin (LOVENOX) injection 30 mg Start: 01/05/21 0800 Place and maintain sequential compression device Start: 12/25/20 0726   Subjective: Lying comfortably in bed-denies any chest pain or shortness of breath.  Assessment/Plan: Severe sepsis due to MRSA bacteremia complicated by prosthetic mitral valve endocarditis and native aortic valve endocarditis,T10-T11 discitis/osteomyelitis and septic emboli to the brain: Sepsis physiology has resolved-blood cultures cleared on 5/27-ID recommending 6 weeks of vancomycin/rifampin from 5/27, and 2 weeks of gentamicin from 5/27.  Per CT surgery-patient not a candidate for redo valve replacement.  Acute metabolic encephalopathy: Due to sepsis/MRSA bacteremia-resolved-patient is currently awake and alert.  SVT: Maintaining sinus rhythm-on Cardizem/Lopressor.  ESRD on HD MWF: Nephrology following and directing care.  Normocytic anemia: Due to a combination of acute illness-ESRD-hemoglobin stable-continue to follow closely Aranesp per nephrology.  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  Diet: Diet Order            Diet regular Room service appropriate? Yes; Fluid consistency: Thin  Diet effective now                  Code Status: Full code   Family Communication:   Disposition Plan: Status is: Inpatient  Remains inpatient appropriate because:Inpatient level of care appropriate due to severity of illness   Dispo: The patient is from: SNF              Anticipated d/c is to: SNF              Patient currently is medically  stable to d/c.   Difficult to place patient No   Barriers to Discharge: MRSA  bacteremia with MV/AV endocarditis-on IV antibiotics-possible SNF on 6/2 once insurance authorization available.  Antimicrobial agents: Anti-infectives (From admission, onward)   Start     Dose/Rate Route Frequency Ordered Stop   01/04/21 1400  rifampin (RIFADIN) capsule 300 mg        300 mg Oral Every 8 hours 01/04/21 0946     01/03/21 1400  gentamicin (GARAMYCIN) 90 mg in dextrose 5 % 50 mL IVPB        90 mg 104.5 mL/hr over 30 Minutes Intravenous Every M-W-F 01/03/21 1021     12/31/20 1152  vancomycin (VANCOREADY) 750 MG/150ML IVPB       Note to Pharmacy: Gaynelle Adu  : cabinet override      12/31/20 1152 12/31/20 1218   12/29/20 1400  gentamicin (GARAMYCIN) IVPB 100 mg  Status:  Discontinued        100 mg 200 mL/hr over 30 Minutes Intravenous Every M-W-F 12/29/20 1312 01/03/21 1021   12/29/20 0932  vancomycin (VANCOREADY) 750 MG/150ML IVPB       Note to Pharmacy: Judieth Keens  : cabinet override      12/29/20 0932 12/29/20 1114   12/27/20 1800  gentamicin (GARAMYCIN) IVPB 80 mg  Status:  Discontinued        80 mg 100 mL/hr over 30 Minutes Intravenous Every M-W-F (1800) 12/24/20 1612 12/29/20 1312   12/27/20 0813  vancomycin (VANCOREADY) 750 MG/150ML IVPB  Status:  Discontinued       Note to Pharmacy: Louretta Shorten   : cabinet override      12/27/20 0813 12/27/20 0815   12/24/20 1700  gentamicin (GARAMYCIN) IVPB 120 mg        120 mg 200 mL/hr over 30 Minutes Intravenous  Once 12/24/20 1612 12/24/20 1910   12/24/20 1430  vancomycin (VANCOCIN) IVPB 750 mg/150 ml premix        750 mg 150 mL/hr over 60 Minutes Intravenous Every M-W-F (Hemodialysis) 12/24/20 1324     12/22/20 1201  vancomycin variable dose per unstable renal function (pharmacist dosing)  Status:  Discontinued         Does not apply See admin instructions 12/22/20 1202 12/24/20 1324   12/22/20 1200  vancomycin (VANCOREADY) IVPB 750 mg/150 mL  Status:  Discontinued        750 mg 150 mL/hr over 60  Minutes Intravenous Every M-W-F (Hemodialysis) 12/22/20 0845 12/22/20 1202   12/22/20 1200  ceFEPIme (MAXIPIME) 2 g in sodium chloride 0.9 % 100 mL IVPB  Status:  Discontinued        2 g 200 mL/hr over 30 Minutes Intravenous Every M-W-F (Hemodialysis) 12/22/20 0845 12/23/20 1450   12/22/20 0715  vancomycin (VANCOREADY) IVPB 1500 mg/300 mL        1,500 mg 150 mL/hr over 120 Minutes Intravenous  Once 12/22/20 0714 12/22/20 1034   12/22/20 0715  ceFEPIme (MAXIPIME) 2 g in sodium chloride 0.9 % 100 mL IVPB        2 g 200 mL/hr over 30 Minutes Intravenous  Once 12/22/20 0714 12/22/20 0806       Time spent: 35 minutes-Greater than 50% of this time was spent in counseling, explanation of diagnosis, planning of further management, and coordination of care.  MEDICATIONS: Scheduled Meds: . (feeding supplement) PROSource Plus  30 mL Oral BID BM  . aspirin EC  325 mg Oral Daily  . Chlorhexidine  Gluconate Cloth  6 each Topical [id number]  . cinacalcet  60 mg Oral Q supper  . darbepoetin (ARANESP) injection - DIALYSIS  100 mcg Intravenous Q Mon-HD  . diltiazem  120 mg Oral Daily  . docusate sodium  100 mg Oral BID  . enoxaparin (LOVENOX) injection  30 mg Subcutaneous Q24H  . feeding supplement (NEPRO CARB STEADY)  237 mL Oral TID BM  . methocarbamol  500 mg Oral TID  . metoprolol tartrate  50 mg Oral BID  . multivitamin  1 tablet Oral QHS  . oxyCODONE  10 mg Oral Q12H  . pantoprazole  40 mg Oral Daily  . pregabalin  75 mg Oral QHS  . rifampin  300 mg Oral Q8H  . senna-docusate  1 tablet Oral BID  . sodium chloride flush  3 mL Intravenous Q12H   Continuous Infusions: . sodium chloride    . sodium chloride    . gentamicin Stopped (01/03/21 2256)  . vancomycin Stopped (01/03/21 2256)   PRN Meds:.sodium chloride, sodium chloride, acetaminophen **OR** acetaminophen, albuterol, alteplase, bisacodyl, heparin, heparin, lidocaine (PF), lidocaine-prilocaine, ondansetron **OR** ondansetron (ZOFRAN) IV,  pentafluoroprop-tetrafluoroeth, polyethylene glycol   PHYSICAL EXAM: Vital signs: Vitals:   01/04/21 1400 01/04/21 2107 01/05/21 0523 01/05/21 0858  BP: 103/70 113/83 109/84 108/89  Pulse: 86 100 100 95  Resp: '16 15 19   '$ Temp: 98.6 F (37 C) 98.7 F (37.1 C) 98.9 F (37.2 C)   TempSrc: Oral Axillary Axillary   SpO2: 95% 97% 97%   Weight:      Height:       Filed Weights   01/03/21 0820 01/03/21 1247 01/04/21 0500  Weight: 67.2 kg 63.2 kg 64.6 kg   Body mass index is 19.32 kg/m.   Gen Exam:Alert awake-not in any distress HEENT:atraumatic, normocephalic Chest: B/L clear to auscultation anteriorly CVS:S1S2 regular Abdomen:soft non tender, non distended Extremities: Right AKA-stump site intact. Neurology: Non focal Skin: no rash  I have personally reviewed following labs and imaging studies  LABORATORY DATA: CBC: Recent Labs  Lab 12/31/20 0100 01/01/21 0030 01/03/21 0657 01/04/21 0040 01/05/21 0821  WBC 22.6* 20.4* 18.1* 24.6* 31.0*  NEUTROABS  --   --   --   --  27.6*  HGB 9.2* 10.3* 9.8* 10.7* 10.8*  HCT 30.4* 34.5* 32.4* 36.2* 35.0*  MCV 78.8* 78.4* 78.6* 80.3 77.1*  PLT 290 338 379 367 456*    Basic Metabolic Panel: Recent Labs  Lab 12/30/20 0158 12/31/20 0100 01/01/21 0030 01/03/21 0657 01/05/21 0821  NA 131* 131* 133* 130* 128*  K 4.0 4.6 4.1 4.5 5.1  CL 94* 94* 96* 91* 90*  CO2 '25 22 25 22 23  '$ GLUCOSE 83 77 86 105* 81  BUN 22* 38* 21* 62* 67*  CREATININE 4.37* 6.47* 4.32* 8.50* 8.03*  CALCIUM 8.7* 9.4 9.1 9.2 9.2  MG 2.0 2.0 2.1 2.5* 2.3    GFR: Estimated Creatinine Clearance: 10.2 mL/min (A) (by C-G formula based on SCr of 8.03 mg/dL (H)).  Liver Function Tests: No results for input(s): AST, ALT, ALKPHOS, BILITOT, PROT, ALBUMIN in the last 168 hours. No results for input(s): LIPASE, AMYLASE in the last 168 hours. No results for input(s): AMMONIA in the last 168 hours.  Coagulation Profile: No results for input(s): INR, PROTIME in  the last 168 hours.  Cardiac Enzymes: No results for input(s): CKTOTAL, CKMB, CKMBINDEX, TROPONINI in the last 168 hours.  BNP (last 3 results) No results for input(s): PROBNP in the last 8760  hours.  Lipid Profile: No results for input(s): CHOL, HDL, LDLCALC, TRIG, CHOLHDL, LDLDIRECT in the last 72 hours.  Thyroid Function Tests: No results for input(s): TSH, T4TOTAL, FREET4, T3FREE, THYROIDAB in the last 72 hours.  Anemia Panel: No results for input(s): VITAMINB12, FOLATE, FERRITIN, TIBC, IRON, RETICCTPCT in the last 72 hours.  Urine analysis:    Component Value Date/Time   COLORURINE YELLOW 08/28/2013 1426   APPEARANCEUR HAZY (A) 08/28/2013 1426   LABSPEC 1.017 08/28/2013 1426   PHURINE 6.0 08/28/2013 1426   GLUCOSEU 100 (A) 08/28/2013 1426   HGBUR SMALL (A) 08/28/2013 1426   BILIRUBINUR NEGATIVE 08/28/2013 1426   KETONESUR NEGATIVE 08/28/2013 1426   PROTEINUR >300 (A) 08/28/2013 1426   UROBILINOGEN 0.2 08/28/2013 1426   NITRITE NEGATIVE 08/28/2013 1426   LEUKOCYTESUR SMALL (A) 08/28/2013 1426    Sepsis Labs: Lactic Acid, Venous    Component Value Date/Time   LATICACIDVEN 1.7 12/22/2020 0714    MICROBIOLOGY: Recent Results (from the past 240 hour(s))  Culture, blood (routine x 2)     Status: Abnormal   Collection Time: 12/27/20  2:17 PM   Specimen: BLOOD  Result Value Ref Range Status   Specimen Description BLOOD THUMB  Final   Special Requests   Final    BOTTLES DRAWN AEROBIC AND ANAEROBIC Blood Culture adequate volume   Culture  Setup Time   Final    GRAM POSITIVE COCCI ANAEROBIC BOTTLE ONLY CRITICAL VALUE NOTED.  VALUE IS CONSISTENT WITH PREVIOUSLY REPORTED AND CALLED VALUE.    Culture (A)  Final    STAPHYLOCOCCUS AUREUS SUSCEPTIBILITIES PERFORMED ON PREVIOUS CULTURE WITHIN THE LAST 5 DAYS. Performed at [facility], [place] [address], [place], [place] [address]    Report Status 01/02/2021 FINAL  Final  Culture, blood (routine x 2)     Status:  Abnormal   Collection Time: 12/27/20  2:17 PM   Specimen: BLOOD RIGHT HAND  Result Value Ref Range Status   Specimen Description BLOOD RIGHT HAND  Final   Special Requests   Final    BOTTLES DRAWN AEROBIC AND ANAEROBIC Blood Culture adequate volume   Culture  Setup Time   Final    GRAM POSITIVE COCCI IN CLUSTERS ANAEROBIC BOTTLE ONLY CRITICAL VALUE NOTED.  VALUE IS CONSISTENT WITH PREVIOUSLY REPORTED AND CALLED VALUE. Performed at [facility], [place] [address] [place], [place] [address]    Culture METHICILLIN RESISTANT STAPHYLOCOCCUS AUREUS (A)  Final   Report Status 12/31/2020 FINAL  Final   Organism ID, Bacteria METHICILLIN RESISTANT STAPHYLOCOCCUS AUREUS  Final      Susceptibility   Methicillin resistant staphylococcus aureus - MIC*    CIPROFLOXACIN >=8 RESISTANT Resistant     ERYTHROMYCIN >=8 RESISTANT Resistant     GENTAMICIN <=0.5 SENSITIVE Sensitive     OXACILLIN >=4 RESISTANT Resistant     TETRACYCLINE <=1 SENSITIVE Sensitive     VANCOMYCIN <=0.5 SENSITIVE Sensitive     TRIMETH/SULFA <=10 SENSITIVE Sensitive     CLINDAMYCIN <=0.25 SENSITIVE Sensitive     RIFAMPIN <=0.5 SENSITIVE Sensitive     Inducible Clindamycin NEGATIVE Sensitive     * METHICILLIN RESISTANT STAPHYLOCOCCUS AUREUS  Culture, blood (routine x 2)     Status: Abnormal   Collection Time: 12/29/20  1:43 PM   Specimen: BLOOD RIGHT HAND  Result Value Ref Range Status   Specimen Description BLOOD RIGHT HAND  Final   Special Requests   Final    BOTTLES DRAWN AEROBIC AND ANAEROBIC Blood  Culture adequate volume   Culture  Setup Time   Final    GRAM POSITIVE COCCI ANAEROBIC BOTTLE ONLY CRITICAL VALUE NOTED.  VALUE IS CONSISTENT WITH PREVIOUSLY REPORTED AND CALLED VALUE.    Culture (A)  Final    STAPHYLOCOCCUS AUREUS SUSCEPTIBILITIES PERFORMED ON PREVIOUS CULTURE WITHIN THE LAST 5 DAYS. Performed at [facility], [place] [address], [place], [place] [address]    Report Status 01/01/2021 FINAL  Final   Culture, blood (routine x 2)     Status: Abnormal   Collection Time: 12/29/20  1:43 PM   Specimen: BLOOD RIGHT HAND  Result Value Ref Range Status   Specimen Description BLOOD RIGHT HAND  Final   Special Requests   Final    BOTTLES DRAWN AEROBIC AND ANAEROBIC Blood Culture adequate volume   Culture  Setup Time   Final    GRAM POSITIVE COCCI IN CLUSTERS ANAEROBIC BOTTLE ONLY CRITICAL VALUE NOTED.  VALUE IS CONSISTENT WITH PREVIOUSLY REPORTED AND CALLED VALUE.    Culture (A)  Final    STAPHYLOCOCCUS AUREUS SUSCEPTIBILITIES PERFORMED ON PREVIOUS CULTURE WITHIN THE LAST 5 DAYS.    Report Status 01/03/2021 FINAL  Final  Culture, blood (routine x 2)     Status: None   Collection Time: 12/31/20  8:48 AM   Specimen: BLOOD RIGHT HAND  Result Value Ref Range Status   Specimen Description BLOOD RIGHT HAND  Final   Special Requests   Final    BOTTLES DRAWN AEROBIC ONLY Blood Culture results may not be optimal due to an excessive volume of blood received in culture bottles   Culture   Final    NO GROWTH 5 DAYS Performed at [facility], [place] [address] [place], [place] [address]    Report Status 01/05/2021 FINAL  Final  Culture, blood (routine x 2)     Status: None   Collection Time: 12/31/20  8:48 AM   Specimen: BLOOD RIGHT FOREARM  Result Value Ref Range Status   Specimen Description BLOOD RIGHT FOREARM  Final   Special Requests   Final    BOTTLES DRAWN AEROBIC ONLY Blood Culture adequate volume   Culture   Final    NO GROWTH 5 DAYS Performed at [facility], [place] [address] [place], [place] [address]    Report Status 01/05/2021 FINAL  Final    RADIOLOGY STUDIES/RESULTS: No results found.   LOS: 14 days   Oren Binet, MD  [facility]    To contact the attending provider between 7A-7P or the covering provider during after hours 7P-7A, please log into the web site [email or web address] and access using universal [place] password for that web site. If you  do not have the password, please call the hospital operator.  01/05/2021, 10:46 AM

## 2021-01-05 NOTE — Progress Notes (Signed)
I gave report hours ago to HD and they have not come to p/u the pt yet. I called back to speak to the nurse in HD to confirm what was going on. I explained that it would be prudent to let the floor staff know if the pt was not going or going many hours later d/t medications to be administered. I called pharmacy back to let them know and the pharmacist let me know that I could re-time the IV abts. I made pt aware.

## 2021-01-06 DIAGNOSIS — R7881 Bacteremia: Secondary | ICD-10-CM | POA: Diagnosis not present

## 2021-01-06 DIAGNOSIS — R6510 Systemic inflammatory response syndrome (SIRS) of non-infectious origin without acute organ dysfunction: Secondary | ICD-10-CM | POA: Diagnosis not present

## 2021-01-06 DIAGNOSIS — N186 End stage renal disease: Secondary | ICD-10-CM | POA: Diagnosis not present

## 2021-01-06 DIAGNOSIS — Z89611 Acquired absence of right leg above knee: Secondary | ICD-10-CM | POA: Diagnosis not present

## 2021-01-06 LAB — CBC WITH DIFFERENTIAL/PLATELET
Abs Immature Granulocytes: 0 10*3/uL (ref 0.00–0.07)
Basophils Absolute: 0 10*3/uL (ref 0.0–0.1)
Basophils Relative: 0 %
Eosinophils Absolute: 0 10*3/uL (ref 0.0–0.5)
Eosinophils Relative: 0 %
HCT: 35.2 % — ABNORMAL LOW (ref 39.0–52.0)
Hemoglobin: 10.8 g/dL — ABNORMAL LOW (ref 13.0–17.0)
Lymphocytes Relative: 4 %
Lymphs Abs: 0.7 10*3/uL (ref 0.7–4.0)
MCH: 23.9 pg — ABNORMAL LOW (ref 26.0–34.0)
MCHC: 30.7 g/dL (ref 30.0–36.0)
MCV: 78 fL — ABNORMAL LOW (ref 80.0–100.0)
Monocytes Absolute: 1.6 10*3/uL — ABNORMAL HIGH (ref 0.1–1.0)
Monocytes Relative: 9 %
Neutro Abs: 15.9 10*3/uL — ABNORMAL HIGH (ref 1.7–7.7)
Neutrophils Relative %: 87 %
Platelets: 423 10*3/uL — ABNORMAL HIGH (ref 150–400)
RBC: 4.51 MIL/uL (ref 4.22–5.81)
RDW: 22.2 % — ABNORMAL HIGH (ref 11.5–15.5)
WBC: 18.3 10*3/uL — ABNORMAL HIGH (ref 4.0–10.5)
nRBC: 0 % (ref 0.0–0.2)
nRBC: 0 /100 WBC

## 2021-01-06 LAB — RESP PANEL BY RT-PCR (FLU A&B, COVID) ARPGX2
Influenza A by PCR: NEGATIVE
Influenza B by PCR: NEGATIVE
SARS Coronavirus 2 by RT PCR: NEGATIVE

## 2021-01-06 LAB — C-REACTIVE PROTEIN: CRP: 8.7 mg/dL — ABNORMAL HIGH (ref <1.0)

## 2021-01-06 MED ORDER — DILTIAZEM HCL ER COATED BEADS 120 MG PO CP24: 120.0000 mg | ORAL_CAPSULE | Freq: Every day | ORAL | Status: AC

## 2021-01-06 MED ORDER — VANCOMYCIN HCL IN DEXTROSE 750-5 MG/150ML-% IV SOLN: 750.0000 mg | INTRAVENOUS | Status: AC

## 2021-01-06 MED ORDER — RIFAMPIN 300 MG PO CAPS: 300.0000 mg | ORAL_CAPSULE | Freq: Three times a day (TID) | ORAL | Status: DC

## 2021-01-06 MED ORDER — OXYCODONE HCL ER 15 MG PO T12A: 15.0000 mg | EXTENDED_RELEASE_TABLET | Freq: Two times a day (BID) | ORAL | 0 refills | Status: DC

## 2021-01-06 MED ORDER — GENTAMICIN SULFATE 40 MG/ML IJ SOLN: 90.0000 mg | INTRAVENOUS | Status: DC

## 2021-01-06 MED ORDER — CHLORHEXIDINE GLUCONATE CLOTH 2 % EX PADS: 6.0000 | MEDICATED_PAD | Freq: Every day | CUTANEOUS | Status: DC

## 2021-01-06 MED ORDER — METOPROLOL TARTRATE 50 MG PO TABS: 25.0000 mg | ORAL_TABLET | Freq: Two times a day (BID) | ORAL | Status: AC

## 2021-01-06 NOTE — Progress Notes (Signed)
Report attempted at [facility] x2

## 2021-01-06 NOTE — Progress Notes (Signed)
Report given to [facility]

## 2021-01-06 NOTE — Progress Notes (Signed)
[facility] arrived to transport patient.  Patient awake, alert, aware he is going to [facility].  Patient out via stretcher with belongings.

## 2021-01-06 NOTE — Progress Notes (Signed)
[facility] Progress Note   Subjective:   Pt reports HD went well last night. No new concerns today, denies SOB, CP, palpitations, dizziness, abdominal pain and nausea.   Objective Vitals:   01/05/21 2300 01/05/21 2316 01/05/21 2324 01/06/21 0359  BP: (!) 82/36 128/67 123/69 (!) 87/56  Pulse: 88 96 94 90  Resp: '16  16 15  '$ Temp:   97.6 F (36.4 C) 97.9 F (36.6 C)  TempSrc:    Axillary  SpO2:    96%  Weight:      Height:       Physical Exam General:WDWN male, alert and in NAD Heart:RRR, no murmur auscultated Lungs:CTA bilaterally without wheezing, rhonchi or rales Abdomen:Soft, non-tender, non-distended, +BS Extremities:R AKA, no edema LLE Dialysis Access:LUE AVG + thrill  Additional Objective Labs: Basic Metabolic Panel: Recent Labs  Lab 01/01/21 0030 01/03/21 0657 01/05/21 0821  NA 133* 130* 128*  K 4.1 4.5 5.1  CL 96* 91* 90*  CO2 '25 22 23  '$ GLUCOSE 86 105* 81  BUN 21* 62* 67*  CREATININE 4.32* 8.50* 8.03*  CALCIUM 9.1 9.2 9.2   CBC: Recent Labs  Lab 01/01/21 0030 01/03/21 0657 01/04/21 0040 01/05/21 0821 01/06/21 0123  WBC 20.4* 18.1* 24.6* 31.0* 18.3*  NEUTROABS  --   --   --  27.6* 15.9*  HGB 10.3* 9.8* 10.7* 10.8* 10.8*  HCT 34.5* 32.4* 36.2* 35.0* 35.2*  MCV 78.4* 78.6* 80.3 77.1* 78.0*  PLT 338 379 367 456* 423*   Blood Culture    Component Value Date/Time   SDES BLOOD RIGHT HAND 12/31/2020 0848   SDES BLOOD RIGHT FOREARM 12/31/2020 0848   SPECREQUEST  12/31/2020 0848    BOTTLES DRAWN AEROBIC ONLY Blood Culture results may not be optimal due to an excessive volume of blood received in culture bottles   SPECREQUEST  12/31/2020 0848    BOTTLES DRAWN AEROBIC ONLY Blood Culture adequate volume   CULT  12/31/2020 0848    NO GROWTH 5 DAYS Performed at [facility], [address], [place], [place] [address]    CULT  12/31/2020 0848    NO GROWTH 5 DAYS Performed at [facility], [place] [address]  [place], [place] [address]    REPTSTATUS 01/05/2021 FINAL 12/31/2020 0848   REPTSTATUS 01/05/2021 FINAL 12/31/2020 0848   Medications: . sodium chloride    . sodium chloride    . gentamicin 90 mg (01/06/21 0213)  . vancomycin Stopped (01/03/21 2256)   . (feeding supplement) PROSource Plus  30 mL Oral BID BM  . aspirin EC  325 mg Oral Daily  . Chlorhexidine Gluconate Cloth  6 each Topical Q0600  . cinacalcet  60 mg Oral Q supper  . darbepoetin (ARANESP) injection - DIALYSIS  100 mcg Intravenous Q Mon-HD  . diltiazem  120 mg Oral Daily  . docusate sodium  100 mg Oral BID  . enoxaparin (LOVENOX) injection  30 mg Subcutaneous Q24H  . feeding supplement (NEPRO CARB STEADY)  237 mL Oral TID BM  . methocarbamol  500 mg Oral TID  . metoprolol tartrate  50 mg Oral BID  . multivitamin  1 tablet Oral QHS  . oxyCODONE  10 mg Oral Q12H  . pantoprazole  40 mg Oral Daily  . pregabalin  75 mg Oral QHS  . rifampin  300 mg Oral Q8H  . senna-docusate  1 tablet Oral BID  . sodium chloride flush  3 mL Intravenous Q12H    Dialysis  Orders: MWF -[facility] 4hrs, [id number], [id number], EDW 72kg,2K/2CaProfile 2  Access:LU AVG vs BCF Heparin5500 unit qHD Mircera253mg q2wks - last 5/9 Venofer '100mg'$  qHD x5 - 1 completed  Assessment/Plan: 1. 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  2. 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 On Vancomycin and Gent per pharmacy.Repeat TEE with MV vegetations. MRI thoracic spine concerning for osteo/discitis T10/11. ID followingand recommending 6 week course of antibiotics after first day of bacteremia clearance (5/27). 3. Back pain -MRI lumbar spine with no discitis/osteomyelitis, +degenerative disc disease at L4-L5, L5-S1.Per primary team 4. Hypoxia-resolved but req O2 [place] at night.  5. ESRD- On HD MWF. Continue on  schedule. Heparin initially held for 1 week per neuro recommendations, will resume PRN 6. Hypertension/volume-BPcontrolled. Tolerating 3-4L UF goals, well below his last outpatient EDW of 72kg. Will need new dry weight at discharge. Noted Na low yesterday, will follow s/p HD.  7. Anemiaof CKD- Hgb10.8-continue weeklyaranesp. Hold IV iron due to acute infection. 8. Secondary Hyperparathyroidism -Corrected calcium running high, not on VDRAContinue sensipar '60mg'$  qdand low calcium bath. Phosphorus binders d/c, phos remains at goal. 9. Nutrition- Renal diet w/fluid restriction. Protein supplement.   SAnice Paganini PA-C 01/06/2021, 9:56 AM  [facility] Pager: [phone]

## 2021-01-06 NOTE — Progress Notes (Signed)
Pharmacy Antibiotic Note  Patrick Anthony is a 50 y.o. male admitted on 12/22/2020 with MRSA bacteremia. He was found to have possible small vegetations associated with the bioprosthetic mitral valve and native aortic valve on TEE.  Pharmacy has been consulted for gentamicin and vancomycin dosing for prosthetic valve endocarditis. Patient is a HD patient with sessions on MWF.    Persistent bacteremia with repeat cultures on 5/23 and 5/25 positive for MRSA. Repeat blood cultures on 5/27 are final no growth.  Plan is 6 weeks of vancomycin and rifampin from 5/27>>7/8 and 2 weeks of gent from 5/27>>6/10    Plan: Continue gentamicin 90 mg every MWF after HD  Goal pre-HD level 3-4 Continue vancomycin 750 mg every MWF with HD  Monitor blood cultures and HD schedule     Height: 6' (182.9 cm) Weight: 66.7 kg (147 lb 0.8 oz) IBW/kg (Calculated) : 77.6  Temp (24hrs), Avg:98 F (36.7 C), Min:97.6 F (36.4 C), Max:98.7 F (37.1 C)  Recent Labs  Lab 12/31/20 0100 01/01/21 0030 01/03/21 0657 01/04/21 0040 01/05/21 0821 01/06/21 0123  WBC 22.6* 20.4* 18.1* 24.6* 31.0* 18.3*  CREATININE 6.47* 4.32* 8.50*  --  8.03*  --   VANCORANDOM  --   --  23  --   --   --   GENTRANDOM  --   --  4.8  --   --   --     Estimated Creatinine Clearance: 10.5 mL/min (A) (by C-G formula based on SCr of 8.03 mg/dL (H)).    No Known Allergies  Patrick Anthony A. Levada Dy, PharmD, BCPS, FNKF Clinical Pharmacist [place] Please utilize Amion for appropriate phone number to reach the unit pharmacist ([place])   01/06/2021 8:48 AM

## 2021-01-06 NOTE — TOC Progression Note (Signed)
Transition of Care [facility]) - Progression Note    Patient Details  Name: Patrick Anthony MRN: [id number] Date of Birth: 20-Apr-1971  Transition of Care [facility]) CM/SW [place], LCSW Phone Number: 01/06/2021, 9:51 AM  Clinical Narrative:    Patrick Anthony has not received insurance approval yet. CSW will request rapid covid test in preparation.    Expected Discharge Plan: Skilled Nursing Facility Barriers to Discharge: Continued Medical Work up  Expected Discharge Plan and Services Expected Discharge Plan: [place] arrangements for the past 2 months: [place]                                       Social Determinants of Health (SDOH) Interventions    Readmission Risk Interventions Readmission Risk Prevention Plan 11/05/2020  Transportation Screening Complete  Medication Review [employer]) Complete  PCP or Specialist appointment within 3-5 days of discharge Complete  HRI or Home Care Consult Complete  SW Recovery Care/Counseling Consult Complete  Palliative Care Screening Complete  Skilled Nursing Facility Complete  Some recent data might be hidden

## 2021-01-06 NOTE — Discharge Summary (Signed)
PATIENT DETAILS Name: Patrick Anthony Age: 50 y.o. Sex: male Date of Birth: 1970-12-10 MRN: [id number]. Admitting Physician: Norval Morton, MD [id number], Myra Rude, MD  Admit Date: 12/22/2020 Discharge date: 01/06/2021  Recommendations for Outpatient Follow-up:  1. Follow up with PCP in 1-2 weeks 2. Please obtain CMP/CBC in one week 3. Vancomycin/rifampin x6 weeks from 5/27, gentamicin x2 weeks from 5/27 4. Please obtain CBC, chemistries, pre-HD vancomycin and gentamicin levels weekly-fax results to ID office 5. Please ensure follow-up with infectious disease, orthopedics  Admitted From:  SNF  Disposition: SNF   Home Health: No  Equipment/Devices: None  Discharge Condition: Stable  CODE STATUS: FULL CODE  Diet recommendation:  Diet Order            Diet - low sodium heart healthy           Diet regular Room service appropriate? Yes; Fluid consistency: Thin  Diet effective now                  Brief Summary: Patient is a 50 y.o. 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  See below for further details.  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  Discharged to SNF. 5/18>> admit with fever/confusion-blood cultures positive for MRSA 5/20>> TEE: vegetation on mitral valve prosthesis, aortic valve vegetation. 5/21>> MRI brain positive for embolic infarcts [id number] MRI T-spine-positive for  discitis at T10-T11 level 5/22>> CT surgery eval-not a candidate for redo mitral valve replacement/aortic valve replacement.  Significant studies: 5/18>> chest x-ray: No pneumonia. 5/18>> CT head: No acute intracranial abnormality 5/18>> MRI LS spine: No discitis/osteomyelitis/abscess 5/20>> TEE: EF 60-65%, vegetation on mitral valve prosthesis, aortic valve vegetation. 5/21>> MRI brain: Multiple embolic infarcts [id number] MRI T-spine: Early discitis/osteo at T10-11 level.  Antimicrobial therapy: Cefepime: 5/18 x 1 Vancomycin: 5/18>> Gentamicin: 5/20>> Rifampin: 5/31>>  Microbiology data: 5/22>> blood culture: MRSA 5/23>> blood culture: MRSA 5/25>> blood culture: MRSA 5/27>> blood culture: No growth  Procedures : 5/20>> TEE: vegetation on mitral valve prosthesis, aortic valve vegetation.  Consults: Palliative care, cardiology, ID, cardiothoracic surgery   Brief Hospital Course: Severe sepsis due to MRSA bacteremia complicated by prosthetic mitral valve endocarditis and native aortic valve endocarditis,T10-T11 discitis/osteomyelitis and septic emboli to the brain: Sepsis physiology has resolved-blood cultures cleared on 5/27-ID recommending 6 weeks of vancomycin/rifampin from 5/27, and 2 weeks of gentamicin from 5/27.  Per CT surgery-patient not a candidate for redo valve replacement.  Please ensure follow-up with infectious disease-see appointment below.  Obtain weekly vancomycin/gentamicin/CBC/chemistry panel-fax results to infectious disease clinic.  Acute metabolic encephalopathy: Due to sepsis/MRSA bacteremia-resolved-patient is currently awake and alert.  SVT: Maintaining sinus rhythm-on Cardizem/Lopressor.  ESRD on HD MWF: Nephrology followed closely-please ensure follow-up at dialysis center at his usual schedule.  Normocytic anemia: Due to a combination of acute illness-ESRD-hemoglobin stable-continue to follow closely Aranesp per nephrology.  Chronic pain  syndrome: Continue narcotics  History of right AKA stump site looks benign  History of liver cirrhosis secondary to chronic hepatitis C: ID planning on outpatient treatment.  Discharge Diagnoses:  Principal  Problem:   Bacteremia due to Staphylococcus aureus Active Problems:   Anemia in chronic kidney disease   Hepatitis C   ESRD on dialysis (HCC)   S/P mitral valve replacement   S/P AKA (above knee amputation), right (HCC)   Personal history of Methicillin resistant Staphylococcus aureus infection   SIRS (systemic inflammatory response syndrome) ([place])   Discharge Instructions:  Activity:  As tolerated   Discharge Instructions    Diet - low sodium heart healthy   Complete by: As directed    Increase activity slowly   Complete by: As directed    No wound care   Complete by: As directed      Allergies as of 01/06/2021   No Known Allergies     Medication List    STOP taking these medications   calcium acetate 667 MG capsule Commonly known as: PHOSLO   cloNIDine 0.2 MG tablet Commonly known as: CATAPRES   HYDROmorphone 2 MG tablet Commonly known as: DILAUDID   vancomycin 750 mg in sodium chloride 0.9 % 150 mL     TAKE these medications   (feeding supplement) PROSource Plus liquid Take 30 mLs by mouth 2 (two) times daily between meals.   feeding supplement (NEPRO CARB STEADY) Liqd Take 237 mLs by mouth 3 (three) times daily between meals.   acetaminophen 325 MG tablet Commonly known as: TYLENOL Take 650 mg by mouth every 6 (six) hours as needed for mild pain. Temp > 100.5   ARANESP (ALBUMIN FREE) IJ Darbepoetin Alfa (Aranesp)   aspirin 325 MG EC tablet Take 1 tablet (325 mg total) by mouth daily.   bisacodyl 10 MG suppository Commonly known as: DULCOLAX Place 1 suppository (10 mg total) rectally daily as needed for moderate constipation.   cinacalcet 30 MG tablet Commonly known as: Sensipar Take 2 tablets (60 mg total) by mouth daily with  breakfast.   diltiazem 120 MG 24 hr capsule Commonly known as: CARDIZEM CD Take 1 capsule (120 mg total) by mouth daily. Start taking on: January 07, 2021   docusate sodium 100 MG capsule Commonly known as: COLACE Take 1 capsule (100 mg total) by mouth 2 (two) times daily.   gentamicin 90 mg in dextrose 5 % 50 mL Inject 90 mg into the vein every Monday, Wednesday, and Friday. 2 weeks from 5/27 Start taking on: January 07, 2021   methocarbamol 500 MG tablet Commonly known as: ROBAXIN Take 1 tablet (500 mg total) by mouth 3 (three) times daily.   metoprolol tartrate 50 MG tablet Commonly known as: LOPRESSOR Take 0.5 tablets (25 mg total) by mouth 2 (two) times daily. What changed: how much to take   multivitamin with minerals Tabs tablet Take 1 tablet by mouth daily.   nitroGLYCERIN 0.4 MG SL tablet Commonly known as: NITROSTAT Place 0.4 mg under the tongue every 5 (five) minutes x 3 doses as needed for chest pain.   oxyCODONE 15 mg 12 hr tablet Commonly known as: OXYCONTIN Take 1 tablet (15 mg total) by mouth every 12 (twelve) hours.   pantoprazole 40 MG tablet Commonly known as: PROTONIX Take 1 tablet (40 mg total) by mouth daily.   polyethylene glycol 17 g packet Commonly known as: MIRALAX / GLYCOLAX Take 17 g by mouth daily as needed for mild constipation.   pregabalin 75 MG capsule Commonly known as: LYRICA Take 1 capsule (75 mg total) by mouth at bedtime.   rifampin 300 MG capsule Commonly known as: RIFADIN Take 1 capsule (300  mg total) by mouth every 8 (eight) hours. 6 weeks from 5/27   senna-docusate 8.6-50 MG tablet Commonly known as: Senokot-S Take 1 tablet by mouth 2 (two) times daily.   sevelamer carbonate 800 MG tablet Commonly known as: RENVELA Take 1,600 mg by mouth 3 (three) times daily with meals.   Vancomycin 750-5 MG/150ML-% Soln Commonly known as: VANCOCIN Inject 150 mLs (750 mg total) into the vein every Monday, Wednesday, and Friday with  hemodialysis. 6 weeks of vancomycin from 5/27       Contact information for follow-up providers    [name], MD. Schedule an appointment as soon as possible for a visit in 1 week(s).   Specialty: Family Medicine Contact information: [address] [place] [address] [phone]        Sueanne Margarita, MD Follow up in 1 month(s).   Specialty: Cardiology Contact information: [id number] [place] [address] [phone]        Rosiland Oz, MD Follow up on 01/21/2021.   Specialty: Infectious Diseases Why: appointment at 3:30 pm Contact information: [address] [place] [address] [phone]        [name], MD Follow up on 01/13/2021.   Specialty: Orthopedic Surgery Why: appt at 10:45am Contact information: [place] [address] [phone]            Contact information for after-discharge care    Destination    [facility] .   Service: Skilled Nursing Contact information: [address] [id number] [phone]                 No Known Allergies  Other Procedures/Studies: DG Chest 1 View  Result Date: 12/13/2020 CLINICAL DATA:  Status post mitral valve replacement EXAM: CHEST  1 VIEW COMPARISON:  November 05, 2020 FINDINGS: There is scarring in the right upper lobe. There is mild right midlung atelectasis. There is a small left pleural effusion. There is no edema or consolidation. There is cardiomegaly with pulmonary vascularity normal. Status post aortic valve replacement. No adenopathy. Stent in left subclavian region. IMPRESSION: Scarring right upper lobe. Mild right midlung atelectasis. Small left pleural effusion. No consolidation. Stable cardiomegaly.  Status post mitral valve replacement. Electronically Signed   By: Lowella Grip III M.D.   On: 12/13/2020 13:41   CT Head Wo Contrast  Result Date: 12/22/2020 CLINICAL  DATA:  Mental status changes of unknown cause.  Confusion. EXAM: CT HEAD WITHOUT CONTRAST TECHNIQUE: Contiguous axial images were obtained from the base of the skull through the vertex without intravenous contrast. COMPARISON:  September 28, 2020. FINDINGS: Brain: No evidence of acute infarction, hemorrhage, hydrocephalus, extra-axial collection or mass lesion/mass effect. Areas of suspected infarct seen on previous imaging not well demonstrated on the current exam. Area of hypodensity in the RIGHT frontal periventricular white matter on image 20 of series 4 appears to have been present on the prior study better and is demonstrated on the previous MRI. Vascular: No hyperdense vessel or unexpected calcification. Skull: Normal. Negative for fracture or focal lesion. Sinuses/Orbits: Visualized paranasal sinuses and orbits are unremarkable. Other: None. IMPRESSION: 1. No acute intracranial abnormality. 2. Subtle changes related to previously described areas of infarct better demonstrated on recent MRI. Electronically Signed   By: Zetta Bills M.D.   On: 12/22/2020 11:25   MR BRAIN WO CONTRAST  Addendum Date: 12/25/2020   ADDENDUM REPORT: 12/25/2020  03:54 ADDENDUM: The pattern is most consistent with an embolic process. Electronically Signed   By: Ulyses Jarred M.D.   On: 12/25/2020 03:54   Result Date: 12/25/2020 CLINICAL DATA:  [place] EXAM: MRI HEAD WITHOUT CONTRAST TECHNIQUE: Multiplanar, multiecho pulse sequences of the brain and surrounding structures were obtained without intravenous contrast. COMPARISON:  09/08/2020 FINDINGS: Brain: Numerous punctate foci of abnormal diffusion restriction within both hemispheres in multiple vascular territories. Multiple scattered microhemorrhages in a nonspecific pattern. There is multifocal hyperintense T2-weighted signal within the white matter. Parenchymal volume and CSF spaces are normal. The midline structures are normal. Vascular: Major flow voids are preserved.  Skull and upper cervical spine: Normal calvarium and skull base. Visualized upper cervical spine and soft tissues are normal. Sinuses/Orbits:No paranasal sinus fluid levels or advanced mucosal thickening. No mastoid or middle ear effusion. Normal orbits. IMPRESSION: 1. Numerous punctate foci of abnormal diffusion restriction within both hemispheres in multiple vascular territories, consistent with acute infarcts. 2. Multiple scattered microhemorrhages in a nonspecific pattern. Electronically Signed: By: Ulyses Jarred M.D. On: 12/25/2020 03:24   MR THORACIC SPINE WO CONTRAST  Result Date: 12/25/2020 CLINICAL DATA:  Back pain.  Leukocytosis.  MRSA bacteremia EXAM: MRI THORACIC SPINE WITHOUT CONTRAST TECHNIQUE: Multiplanar, multisequence MR imaging of the thoracic spine was performed. No intravenous contrast was administered. COMPARISON:  MRI 09/29/2020 FINDINGS: Alignment:  Physiologic. Vertebrae: There is fluid signal present within the T10-11 intervertebral disc space with bone marrow edema at the superior endplate of 624THL. More subtle changes along the inferior endplate of QA348G. Possible subtle central endplate erosion at 624THL (series 6, image 8). There is mild prevertebral soft tissue edema anterior to the T10 and T11 vertebral bodies (series 6, images 7-9). There is a similar degree of endplate marrow edema centered at the C6-7 intervertebral disc space, likely representing degenerative discogenic changes. No acute fracture. No suspicious bone lesion. Diffusely decreased T1 marrow signal throughout the visualized bone marrow, which may be related to chronic anemia. Cord: Normal signal and morphology. No evidence of epidural fluid collection. Paraspinal and other soft tissues: Small bilateral pleural effusions. No soft tissue fluid collection or abscess. Disc levels: Mild right greater than left foraminal stenosis at the T10-11 level. No canal stenosis at any level within the thoracic spine. IMPRESSION: 1.  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 Mild prevertebral soft tissue edema at this level. No evidence of an epidural fluid collection or abscess. 2. Moderate endplate marrow edema centered at the C6-7 intervertebral disc space is similar to the previous MRI, likely representing degenerative discogenic changes. 3. Small bilateral pleural effusions. These results will be called to the ordering clinician or representative by the Radiologist Assistant, and communication documented in the PACS or [employer]. Electronically Signed   By: Davina Poke D.O.   On: 12/25/2020 17:51   MR Lumbar Spine W Wo Contrast  Result Date: 12/22/2020 CLINICAL DATA:  Low back pain, possible infection EXAM: MRI LUMBAR SPINE WITHOUT AND WITH CONTRAST TECHNIQUE: Multiplanar and multiecho pulse sequences of the lumbar spine were obtained without and with intravenous contrast. CONTRAST:  7.66m GADAVIST GADOBUTROL 1 MMOL/ML IV SOLN COMPARISON:  Lumbar MRI 09/30/2020 FINDINGS: Despite efforts by the technologist and patient, motion artifact is present on today's exam and could not be eliminated. This reduces exam sensitivity and specificity. Segmentation: The lowest lumbar type non-rib-bearing vertebra is labeled as L5. Alignment:  Unremarkable Vertebrae: Type 2 degenerative endplate findings at L075-GRMwith endplate irregularities, disc desiccation, and loss  of disc height. This is mostly similar to the 09/30/2020 exam. There is also disc desiccation at L4-5 similar to prior. No significant abnormal degree of bony enhancement. Conus medullaris and cauda equina: Conus extends to the L1 level. Conus and cauda equina appear normal. Paraspinal and other soft tissues: Innumerable cysts of varying size and complexity scattered in both kidneys compatible with polycystic kidney disease. The kidneys are indistinct due to motion in today's exam was not optimized to assess the kidneys. Small retroperitoneal  lymph nodes are not pathologically enlarged by size criteria. Disc levels: L1-2: Unremarkable. L2-3: Unremarkable. L3-4: Unremarkable. L4-5: Mild bilateral subarticular lateral recess stenosis due to disc bulge, minimally worsened from prior. L5-S1: Mild bilateral subarticular lateral recess stenosis and borderline bilateral foraminal stenosis due to intervertebral spurring, disc bulge, and facet arthropathy, similar to prior. IMPRESSION: 1. No findings of discitis or osteomyelitis. No compelling findings of epidural or psoas abscess. 2. Lumbar spondylosis and degenerative disc disease with mild impingement at L4-5 and L5-S1. Degenerative endplate findings particularly at L5-S1. 3. Autosomal dominant polycystic kidney disease. Electronically Signed   By: Van Clines M.D.   On: 12/22/2020 12:19   DG Hand 2 View Right  Result Date: 12/31/2020 CLINICAL DATA:  Pain at first digit EXAM: RIGHT HAND - 2 VIEW COMPARISON:  None. FINDINGS: Frontal and lateral views of the right hand are obtained. No fracture, subluxation, or dislocation. There is mild joint space narrowing at the first metacarpophalangeal joint, first interphalangeal joint, and throughout the remaining distal interphalangeal joints consistent with osteoarthritis. Vascular calcifications are seen throughout the hand and wrist. Soft tissues are otherwise unremarkable. IMPRESSION: 1. 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 2. No acute bony abnormality. Electronically Signed   By: Randa Ngo M.D.   On: 12/31/2020 19:27   DG Chest Port 1 View  Result Date: 12/22/2020 CLINICAL DATA:  50 year old male with possible sepsis. Dialysis patient. Weakness. EXAM: PORTABLE CHEST 1 VIEW COMPARISON:  Chest radiographs 12/13/2020 and earlier. FINDINGS: Portable AP semi upright view at 0734 hours. Lower lung volumes. Stable cardiomegaly and mediastinal contours. Visualized tracheal air column is within normal limits. No pneumothorax. Chronic  architectural distortion in the right lung with curvilinear opacity is stable. Mild hypo ventilation and atelectasis now at both lung bases. No consolidation. No pleural effusion is evident. Stable visualized osseous structures. Abundant chronic dystrophic calcification about the visible right shoulder. Chronic left subclavian vascular stent. Paucity of bowel gas in the upper abdomen. IMPRESSION: 1. Lower lung volumes with atelectasis. 2. Chronic cardiomegaly and right lung scarring. Electronically Signed   By: Genevie Ann M.D.   On: 12/22/2020 07:50   ECHO TEE  Result Date: 12/24/2020    TRANSESOPHOGEAL ECHO REPORT   Patient Name:   VAL DENIGRIS Date of Exam: 12/24/2020 Medical Rec #:  [id number]      Height:       72.0 in Accession #:    [id number]     Weight:       159.8 lb Date of Birth:  [birth year]      BSA:          1.937 m Patient Age:    81 years       BP:           155/91 mmHg Patient Gender: M              HR:           95 bpm. Exam Location:  Inpatient Procedure: 2D Echo,  Cardiac Doppler, Color Doppler and Transesophageal Echo Indications:     Bacteremia  History:         Patient has prior history of Echocardiogram examinations, most                  recent 12/22/2020.                   Mitral Valve: bioprosthetic valve valve is present in the                  mitral position.  Sonographer:     Merrie Roof RDCS Referring Phys:  [id number] [facility] Diagnosing Phys: Lyman Bishop MD PROCEDURE: After discussion of the risks and benefits of a TEE, an informed consent was obtained from the patient. The transesophogeal probe was passed without difficulty through the esophogus of the patient. Local oropharyngeal anesthetic was provided with Cetacaine. Sedation performed by different physician. The patient was monitored while under deep sedation. The patient's vital signs; including heart rate, blood pressure, and oxygen saturation; remained stable throughout the procedure. The patient developed no  complications during the procedure. IMPRESSIONS  1. Left ventricular ejection fraction, by estimation, is 60 to 65%. The left ventricle has normal function. The left ventricle has no regional wall motion abnormalities. There is moderate left ventricular hypertrophy.  2. Right ventricular systolic function is normal. The right ventricular size is severely enlarged. Mildly increased right ventricular wall thickness.  3. No left atrial/left atrial appendage thrombus was detected.  4. Right atrial size was severely dilated.  5. The mitral valve has been repaired/replaced. Trivial mitral valve regurgitation. There is a bioprosthetic valve present in the mitral position. Echo findings are consistent with vegetation the mitral prosthesis.  6. Aortic valve vegetation is visualized on the left.  7. The aortic valve is tricuspid. Aortic valve regurgitation is not visualized.  8. There is mild (Grade II) plaque. Conclusion(s)/Recommendation(s): Findings are concerning for vegetation/infective endocarditis as detailed above. FINDINGS  Left Ventricle: Left ventricular ejection fraction, by estimation, is 60 to 65%. The left ventricle has normal function. The left ventricle has no regional wall motion abnormalities. The left ventricular internal cavity size was normal in size. There is  moderate left ventricular hypertrophy. Right Ventricle: The right ventricular size is severely enlarged. Mildly increased right ventricular wall thickness. Right ventricular systolic function is normal. Left Atrium: Left atrial size was normal in size. No left atrial/left atrial appendage thrombus was detected. Right Atrium: Right atrial size was severely dilated. Pericardium: There is no evidence of pericardial effusion. Mitral Valve: The mitral valve has been repaired/replaced. Trivial mitral valve regurgitation. There is a bioprosthetic valve present in the mitral position. Echo findings are consistent with vegetation on the mitral prosthesis.  Tricuspid Valve: The tricuspid valve is grossly normal. Tricuspid valve regurgitation is mild. Aortic Valve: The aortic valve is tricuspid. Aortic valve regurgitation is not visualized. A mobile vegetation is seen on the left. The AoV vegetation measures 5 mm x 5 mm. Pulmonic Valve: The pulmonic valve was grossly normal. Pulmonic valve regurgitation is trivial. Aorta: The aortic root and ascending aorta are structurally normal, with no evidence of dilitation. There is mild (Grade II) plaque. IAS/Shunts: No atrial level shunt detected by color flow Doppler. Lyman Bishop MD Electronically signed by Lyman Bishop MD Signature Date/Time: 12/24/2020/3:38:39 PM    Final      TODAY-DAY OF DISCHARGE:  Subjective:   Melodye Ped today has no headache,no chest abdominal pain,no new weakness  tingling or numbness, feels much better wants to go home today.  Objective:   Blood pressure (!) 87/56, pulse 90, temperature 97.9 F (36.6 C), temperature source Axillary, resp. rate 15, height 6' (1.829 m), weight 66.7 kg, SpO2 96 %.  Intake/Output Summary (Last 24 hours) at 01/06/2021 1103 Last data filed at 01/05/2021 2324 Gross per 24 hour  Intake --  Output 2191 ml  Net -2191 ml   Filed Weights   01/03/21 1247 01/04/21 0500 01/05/21 1954  Weight: 63.2 kg 64.6 kg 66.7 kg    Exam: Awake Alert, Oriented *3, No new F.N deficits, Normal affect Catharine.AT,PERRAL Supple Neck,No JVD, No cervical lymphadenopathy appriciated.  Symmetrical Chest wall movement, Good air movement bilaterally, CTAB RRR,No Gallops,Rubs or new Murmurs, No Parasternal Heave +ve B.Sounds, Abd Soft, Non tender, No organomegaly appriciated, No rebound -guarding or rigidity. No Cyanosis, Clubbing or edema, No new Rash or bruise   PERTINENT RADIOLOGIC STUDIES: No results found.   PERTINENT LAB RESULTS: CBC: Recent Labs    01/05/21 0821 01/06/21 0123  WBC 31.0* 18.3*  HGB 10.8* 10.8*  HCT 35.0* 35.2*  PLT 456* 423*    CMET CMP     Component Value Date/Time   NA 128 (L) 01/05/2021 0821   K 5.1 01/05/2021 0821   CL 90 (L) 01/05/2021 0821   CO2 23 01/05/2021 0821   GLUCOSE 81 01/05/2021 0821   BUN 67 (H) 01/05/2021 0821   CREATININE 8.03 (H) 01/05/2021 0821   CALCIUM 9.2 01/05/2021 0821   PROT 8.2 (H) 12/29/2020 0035   PROT 8.9 (H) 12/04/2019 1252   ALBUMIN 2.1 (L) 12/29/2020 0035   ALBUMIN 2.2 (L) 12/29/2020 0035   AST 28 12/29/2020 0035   ALT 11 12/29/2020 0035   ALKPHOS 74 12/29/2020 0035   BILITOT 1.9 (H) 12/29/2020 0035   GFRNONAA 8 (L) 01/05/2021 0821   GFRAA 5 (L) 10/31/2019 0252    GFR Estimated Creatinine Clearance: 10.5 mL/min (A) (by C-G formula based on SCr of 8.03 mg/dL (H)). No results for input(s): LIPASE, AMYLASE in the last 72 hours. No results for input(s): CKTOTAL, CKMB, CKMBINDEX, TROPONINI in the last 72 hours. Invalid input(s): POCBNP No results for input(s): DDIMER in the last 72 hours. No results for input(s): HGBA1C in the last 72 hours. No results for input(s): CHOL, HDL, LDLCALC, TRIG, CHOLHDL, LDLDIRECT in the last 72 hours. No results for input(s): TSH, T4TOTAL, T3FREE, THYROIDAB in the last 72 hours.  Invalid input(s): FREET3 No results for input(s): VITAMINB12, FOLATE, FERRITIN, TIBC, IRON, RETICCTPCT in the last 72 hours. Coags: No results for input(s): INR in the last 72 hours.  Invalid input(s): PT Microbiology: Recent Results (from the past 240 hour(s))  Culture, blood (routine x 2)     Status: Abnormal   Collection Time: 12/27/20  2:17 PM   Specimen: BLOOD  Result Value Ref Range Status   Specimen Description BLOOD THUMB  Final   Special Requests   Final    BOTTLES DRAWN AEROBIC AND ANAEROBIC Blood Culture adequate volume   Culture  Setup Time   Final    GRAM POSITIVE COCCI ANAEROBIC BOTTLE ONLY CRITICAL VALUE NOTED.  VALUE IS CONSISTENT WITH PREVIOUSLY REPORTED AND CALLED VALUE.    Culture (A)  Final    STAPHYLOCOCCUS  AUREUS SUSCEPTIBILITIES PERFORMED ON PREVIOUS CULTURE WITHIN THE LAST 5 DAYS. Performed at [facility], [place] [address], [place], [place] [address]    Report Status 01/02/2021 FINAL  Final  Culture, blood (routine  x 2)     Status: Abnormal   Collection Time: 12/27/20  2:17 PM   Specimen: BLOOD RIGHT HAND  Result Value Ref Range Status   Specimen Description BLOOD RIGHT HAND  Final   Special Requests   Final    BOTTLES DRAWN AEROBIC AND ANAEROBIC Blood Culture adequate volume   Culture  Setup Time   Final    GRAM POSITIVE COCCI IN CLUSTERS ANAEROBIC BOTTLE ONLY CRITICAL VALUE NOTED.  VALUE IS CONSISTENT WITH PREVIOUSLY REPORTED AND CALLED VALUE. Performed at [facility], [place] [address] [place], [place] [address]    Culture METHICILLIN RESISTANT STAPHYLOCOCCUS AUREUS (A)  Final   Report Status 12/31/2020 FINAL  Final   Organism ID, Bacteria METHICILLIN RESISTANT STAPHYLOCOCCUS AUREUS  Final      Susceptibility   Methicillin resistant staphylococcus aureus - MIC*    CIPROFLOXACIN >=8 RESISTANT Resistant     ERYTHROMYCIN >=8 RESISTANT Resistant     GENTAMICIN <=0.5 SENSITIVE Sensitive     OXACILLIN >=4 RESISTANT Resistant     TETRACYCLINE <=1 SENSITIVE Sensitive     VANCOMYCIN <=0.5 SENSITIVE Sensitive     TRIMETH/SULFA <=10 SENSITIVE Sensitive     CLINDAMYCIN <=0.25 SENSITIVE Sensitive     RIFAMPIN <=0.5 SENSITIVE Sensitive     Inducible Clindamycin NEGATIVE Sensitive     * METHICILLIN RESISTANT STAPHYLOCOCCUS AUREUS  Culture, blood (routine x 2)     Status: Abnormal   Collection Time: 12/29/20  1:43 PM   Specimen: BLOOD RIGHT HAND  Result Value Ref Range Status   Specimen Description BLOOD RIGHT HAND  Final   Special Requests   Final    BOTTLES DRAWN AEROBIC AND ANAEROBIC Blood Culture adequate volume   Culture  Setup Time   Final    GRAM POSITIVE COCCI ANAEROBIC BOTTLE ONLY CRITICAL VALUE NOTED.  VALUE IS CONSISTENT WITH PREVIOUSLY REPORTED AND CALLED VALUE.     Culture (A)  Final    STAPHYLOCOCCUS AUREUS SUSCEPTIBILITIES PERFORMED ON PREVIOUS CULTURE WITHIN THE LAST 5 DAYS. Performed at [facility], [place] [address] [place], [place] [address]    Report Status 01/01/2021 FINAL  Final  Culture, blood (routine x 2)     Status: Abnormal   Collection Time: 12/29/20  1:43 PM   Specimen: BLOOD RIGHT HAND  Result Value Ref Range Status   Specimen Description BLOOD RIGHT HAND  Final   Special Requests   Final    BOTTLES DRAWN AEROBIC AND ANAEROBIC Blood Culture adequate volume   Culture  Setup Time   Final    GRAM POSITIVE COCCI IN CLUSTERS ANAEROBIC BOTTLE ONLY CRITICAL VALUE NOTED.  VALUE IS CONSISTENT WITH PREVIOUSLY REPORTED AND CALLED VALUE.    Culture (A)  Final    STAPHYLOCOCCUS AUREUS SUSCEPTIBILITIES PERFORMED ON PREVIOUS CULTURE WITHIN THE LAST 5 DAYS.    Report Status 01/03/2021 FINAL  Final  Culture, blood (routine x 2)     Status: None   Collection Time: 12/31/20  8:48 AM   Specimen: BLOOD RIGHT HAND  Result Value Ref Range Status   Specimen Description BLOOD RIGHT HAND  Final   Special Requests   Final    BOTTLES DRAWN AEROBIC ONLY Blood Culture results may not be optimal due to an excessive volume of blood received in culture bottles   Culture   Final    NO GROWTH 5 DAYS Performed at [facility], [place] [address] [place], [place] [address]    Report Status 01/05/2021 FINAL  Final  Culture, blood (routine x 2)     Status: None   Collection Time: 12/31/20  8:48 AM   Specimen: BLOOD RIGHT FOREARM  Result Value Ref Range Status   Specimen Description BLOOD RIGHT FOREARM  Final   Special Requests   Final    BOTTLES DRAWN AEROBIC ONLY Blood Culture adequate volume   Culture   Final    NO GROWTH 5 DAYS Performed at [facility], [address] [place], [place] [address]    Report Status 01/05/2021 FINAL  Final    FURTHER DISCHARGE INSTRUCTIONS:  Get Medicines reviewed and adjusted: Please take all  your medications with you for your next visit with your Primary MD  Laboratory/radiological data: Please request your Primary MD to go over all hospital tests and procedure/radiological results at the follow up, please ask your Primary MD to get all Hospital records sent to his/her office.  In some cases, they will be blood work, cultures and biopsy results pending at the time of your discharge. Please request that your primary care M.D. goes through all the records of your hospital data and follows up on these results.  Also Note the following: If you experience worsening of your admission symptoms, develop shortness of breath, life threatening emergency, suicidal or homicidal thoughts you must seek medical attention immediately by calling 911 or calling your MD immediately  if symptoms less severe.  You must read complete instructions/literature along with all the possible adverse reactions/side effects for all the Medicines you take and that have been prescribed to you. Take any new Medicines after you have completely understood and accpet all the possible adverse reactions/side effects.   Do not drive when taking Pain medications or sleeping medications (Benzodaizepines)  Do not take more than prescribed Pain, Sleep and Anxiety Medications. It is not advisable to combine anxiety,sleep and pain medications without talking with your primary care practitioner  Special Instructions: If you have smoked or chewed Tobacco  in the last 2 yrs please stop smoking, stop any regular Alcohol  and or any Recreational drug use.  Wear Seat belts while driving.  Please note: You were cared for by a hospitalist during your hospital stay. Once you are discharged, your primary care physician will handle any further medical issues. Please note that NO REFILLS for any discharge medications will be authorized once you are discharged, as it is imperative that you return to your primary care physician (or establish  a relationship with a primary care physician if you do not have one) for your post hospital discharge needs so that they can reassess your need for medications and monitor your lab values.  Total Time spent coordinating discharge including counseling, education and face to face time equals 35 minutes.  SignedOren Binet 01/06/2021 11:03 AM

## 2021-01-06 NOTE — TOC Transition Note (Signed)
Transition of Care [facility]) - CM/SW Discharge Note   Patient Details  Name: Patrick Anthony MRN: [id number] Date of Birth: [birth year]  Transition of Care [facility] CM/SW Contact:  Benard Halsted, LCSW Phone Number: 01/06/2021, 2:51 PM   Clinical Narrative:    Patient will DC to: [place] Anticipated DC date: 01/06/21 Family notified: Patient stated he will notify his family in [place]. He is hopeful SNF can help him get his prosthetic. Transport by: Corey Harold   Per MD patient ready for DC to [facility]. RN to call report prior to discharge ([phone]). RN, patient, patient's family, and facility notified of DC. Discharge Summary and FL2 sent to facility. DC packet on chart. Ambulance transport requested for patient.   CSW will sign off for now as social work intervention is no longer needed. Please consult [place] again if new needs arise.      Final next level of care: Skilled Nursing Facility Barriers to Discharge: Barriers Resolved   Patient Goals and CMS Choice Patient states their goals for this hospitalization and ongoing recovery are:: Rehab CMS Medicare.gov Compare Post Acute Care list provided to:: Patient Choice offered to / list presented to : Patient  Discharge Placement   Existing PASRR number confirmed : 01/06/21          Patient chooses bed at: [facility] Patient to be transferred to facility by: [facility] Name of family member notified: Patient notifying family Patient and family notified of of transfer: 01/06/21  Discharge Plan and Services                                     Social Determinants of Health (SDOH) Interventions     Readmission Risk Interventions Readmission Risk Prevention Plan 11/05/2020  Transportation Screening Complete  Medication Review Press photographer) Complete  PCP or Specialist appointment within 3-5 days of discharge Complete  HRI or Home Care Consult Complete  SW Recovery Care/Counseling Consult  Complete  Palliative Care Screening Complete  Skilled Nursing Facility Complete  Some recent data might be hidden

## 2021-01-06 NOTE — Progress Notes (Signed)
Late Entry: Navigator notified by CSW that patient will be on gent with HD at discharge. Navigator asked clinic if they regularly carry this--they do not, but they will order it and expect it will be here by Friday, 6/3.  Alphonzo Cruise, [place] Renal Navigator [phone]

## 2021-01-07 ENCOUNTER — Telehealth: Payer: Self-pay | Admitting: *Deleted

## 2021-01-07 NOTE — Telephone Encounter (Signed)
Transition Care Management Follow-up Telephone Call  Date of discharge and from where: 01/06/2021 - [facility]  How have you been since you were released from the hospital? "Doing alright"  Any questions or concerns? No  Items Reviewed:  Did the pt receive and understand the discharge instructions provided? Yes   Medications obtained and verified? Yes   Other? No   Any new allergies since your discharge? No   Dietary orders reviewed? No  Do you have support at home? Yes   Functional Questionnaire: (I = Independent and D = Dependent) ADLs: I  Bathing/Dressing- I  Meal Prep- I  Eating- I  Maintaining continence- I  Transferring/Ambulation- I  Managing Meds- I  Follow up appointments reviewed:   PCP Hospital f/u appt confirmed? No    Specialist Hospital f/u appt confirmed? Yes  Scheduled to see Ortho on 01/13/2021 @ 1045, Infectious Disease on 01/21/2021 @ 1530 and TCTS on 01/24/2021 @ 1300.  Are transportation arrangements needed? Not at this time  If their condition worsens, is the pt aware to call PCP or go to the Emergency Dept.? Yes  Was the patient provided with contact information for the PCP's office or ED? Yes  Was to pt encouraged to call back with questions or concerns? Yes

## 2021-01-08 DIAGNOSIS — R7881 Bacteremia: Secondary | ICD-10-CM | POA: Diagnosis not present

## 2021-01-08 DIAGNOSIS — D689 Coagulation defect, unspecified: Secondary | ICD-10-CM | POA: Diagnosis not present

## 2021-01-08 DIAGNOSIS — N186 End stage renal disease: Secondary | ICD-10-CM | POA: Diagnosis not present

## 2021-01-08 DIAGNOSIS — N2581 Secondary hyperparathyroidism of renal origin: Secondary | ICD-10-CM | POA: Diagnosis not present

## 2021-01-08 DIAGNOSIS — Z992 Dependence on renal dialysis: Secondary | ICD-10-CM | POA: Diagnosis not present

## 2021-01-10 DIAGNOSIS — D631 Anemia in chronic kidney disease: Secondary | ICD-10-CM | POA: Diagnosis not present

## 2021-01-10 DIAGNOSIS — D509 Iron deficiency anemia, unspecified: Secondary | ICD-10-CM | POA: Diagnosis not present

## 2021-01-10 DIAGNOSIS — Z992 Dependence on renal dialysis: Secondary | ICD-10-CM | POA: Diagnosis not present

## 2021-01-10 DIAGNOSIS — E875 Hyperkalemia: Secondary | ICD-10-CM | POA: Diagnosis not present

## 2021-01-10 DIAGNOSIS — N186 End stage renal disease: Secondary | ICD-10-CM | POA: Diagnosis not present

## 2021-01-10 DIAGNOSIS — R7881 Bacteremia: Secondary | ICD-10-CM | POA: Diagnosis not present

## 2021-01-10 DIAGNOSIS — R5381 Other malaise: Secondary | ICD-10-CM | POA: Diagnosis not present

## 2021-01-10 DIAGNOSIS — B9562 Methicillin resistant Staphylococcus aureus infection as the cause of diseases classified elsewhere: Secondary | ICD-10-CM | POA: Diagnosis not present

## 2021-01-10 DIAGNOSIS — I330 Acute and subacute infective endocarditis: Secondary | ICD-10-CM | POA: Diagnosis not present

## 2021-01-10 DIAGNOSIS — N2581 Secondary hyperparathyroidism of renal origin: Secondary | ICD-10-CM | POA: Diagnosis not present

## 2021-01-10 DIAGNOSIS — D689 Coagulation defect, unspecified: Secondary | ICD-10-CM | POA: Diagnosis not present

## 2021-01-12 DIAGNOSIS — R7881 Bacteremia: Secondary | ICD-10-CM | POA: Diagnosis not present

## 2021-01-12 DIAGNOSIS — N2581 Secondary hyperparathyroidism of renal origin: Secondary | ICD-10-CM | POA: Diagnosis not present

## 2021-01-12 DIAGNOSIS — D689 Coagulation defect, unspecified: Secondary | ICD-10-CM | POA: Diagnosis not present

## 2021-01-12 DIAGNOSIS — Z992 Dependence on renal dialysis: Secondary | ICD-10-CM | POA: Diagnosis not present

## 2021-01-12 DIAGNOSIS — E875 Hyperkalemia: Secondary | ICD-10-CM | POA: Diagnosis not present

## 2021-01-12 DIAGNOSIS — D631 Anemia in chronic kidney disease: Secondary | ICD-10-CM | POA: Diagnosis not present

## 2021-01-12 DIAGNOSIS — N186 End stage renal disease: Secondary | ICD-10-CM | POA: Diagnosis not present

## 2021-01-12 DIAGNOSIS — D509 Iron deficiency anemia, unspecified: Secondary | ICD-10-CM | POA: Diagnosis not present

## 2021-01-13 ENCOUNTER — Ambulatory Visit (INDEPENDENT_AMBULATORY_CARE_PROVIDER_SITE_OTHER): Payer: Medicaid Other | Admitting: Orthopedic Surgery

## 2021-01-13 ENCOUNTER — Encounter: Payer: Self-pay | Admitting: Orthopedic Surgery

## 2021-01-13 DIAGNOSIS — Z89611 Acquired absence of right leg above knee: Secondary | ICD-10-CM

## 2021-01-13 DIAGNOSIS — B9562 Methicillin resistant Staphylococcus aureus infection as the cause of diseases classified elsewhere: Secondary | ICD-10-CM | POA: Diagnosis not present

## 2021-01-13 DIAGNOSIS — Z20828 Contact with and (suspected) exposure to other viral communicable diseases: Secondary | ICD-10-CM | POA: Diagnosis not present

## 2021-01-13 DIAGNOSIS — I76 Septic arterial embolism: Secondary | ICD-10-CM | POA: Diagnosis not present

## 2021-01-13 DIAGNOSIS — I330 Acute and subacute infective endocarditis: Secondary | ICD-10-CM | POA: Diagnosis not present

## 2021-01-13 NOTE — Progress Notes (Signed)
Office Visit Note   Patient: Patrick Anthony           Date of Birth: 11-22-1970           MRN: [id number] Visit Date: 01/13/2021              Requested by: [name], MD [place],  [place] [address] PCP: [name], MD  Chief Complaint  Patient presents with   Right Knee - Follow-up      HPI: Patient is a 50 year old gentleman who presents 2-1/2 months status post right above-knee amputation he is currently working with therapy in skilled nursing.  Assessment & Plan: Visit Diagnoses:  1. S/P AKA (above knee amputation) unilateral, right ([place])     Plan: Patient is given a prescription for a shrinker and a above-the-knee prosthesis K2 level ambulator for [employer].  Follow-Up Instructions: Return in about 4 weeks (around 02/10/2021).   Ortho Exam  Patient is alert, oriented, no adenopathy, well-dressed, normal affect, normal respiratory effort. Examination patient's right above-the-knee amputation is well-healed there is no redness no cellulitis no signs of infection.  There is no drainage.  Imaging: No results found. No images are attached to the encounter.  Labs: Lab Results  Component Value Date   HGBA1C 5.5 09/29/2020   HGBA1C 5.4 09/28/2020   HGBA1C 4.6 08/28/2013   ESRSEDRATE 38 (H) 01/05/2021   ESRSEDRATE 69 (H) 12/28/2020   ESRSEDRATE 102 (H) 11/05/2020   CRP 8.7 (H) 01/06/2021   CRP 17.2 (H) 12/28/2020   CRP 28.8 (H) 11/05/2020   REPTSTATUS 01/05/2021 FINAL 12/31/2020   REPTSTATUS 01/05/2021 FINAL 12/31/2020   GRAMSTAIN  10/20/2020    RARE WBC PRESENT,BOTH PMN AND MONONUCLEAR NO ORGANISMS SEEN    CULT  12/31/2020    NO GROWTH 5 DAYS Performed at [facility], [place] [address] [place], [place] [address]    CULT  12/31/2020    NO GROWTH 5 DAYS Performed at [place] [address], [place],  [address]    LABORGA METHICILLIN RESISTANT STAPHYLOCOCCUS AUREUS 12/27/2020     Lab Results  Component Value Date    ALBUMIN 2.1 (L) 12/29/2020   ALBUMIN 2.2 (L) 12/29/2020   ALBUMIN 2.2 (L) 12/28/2020   PREALBUMIN 10.8 (L) 10/13/2020   PREALBUMIN 12.9 (L) 10/07/2020    Lab Results  Component Value Date   MG 2.3 01/05/2021   MG 2.5 (H) 01/03/2021   MG 2.1 01/01/2021   No results found for: [facility]  Lab Results  Component Value Date   PREALBUMIN 10.8 (L) 10/13/2020   PREALBUMIN 12.9 (L) 10/07/2020   CBC EXTENDED Latest Ref Rng & Units 01/06/2021 01/05/2021 01/04/2021  WBC 4.0 - 10.5 K/uL 18.3(H) 31.0(H) 24.6(H)  RBC 4.22 - 5.81 MIL/uL 4.51 4.54 4.51  HGB 13.0 - 17.0 g/dL 10.8(L) 10.8(L) 10.7(L)  HCT 39.0 - 52.0 % 35.2(L) 35.0(L) 36.2(L)  PLT 150 - 400 K/uL 423(H) 456(H) 367  NEUTROABS 1.7 - 7.7 K/uL 15.9(H) 27.6(H) -  LYMPHSABS 0.7 - 4.0 K/uL 0.7 1.6 -     There is no height or weight on file to calculate BMI.  Orders:  No orders of the defined types were placed in this encounter.  No orders of the defined types were placed in this encounter.    Procedures: No procedures performed  Clinical Data: No additional findings.  ROS:  All other systems negative, except as noted in the HPI. Review of Systems  Objective: Vital  Signs: There were no vitals taken for this visit.  Specialty Comments:  No specialty comments available.  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)    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)    Abscess of right leg    S/P mitral valve replacement    S/P mitral valve repair    S/P minimally-invasive mitral valve replacement with bioprosthetic valve  10/14/2020   Dental caries    Chronic apical periodontitis    Chronic periodontitis    Accretions on teeth    Mitral regurgitation    Uncontrolled hypertension    Patent foramen ovale    Cerebral embolism with cerebral infarction 09/29/2020   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 09/29/2020   Bacteremia due to Staphylococcus aureus    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   Lactic acidosis 09/27/2020   Bacterial endocarditis 09/27/2020   Allergy, unspecified, subsequent encounter 04/15/2020   Anaphylactic shock, unspecified, subsequent encounter 0000000   Complication of vascular dialysis catheter 10/31/2019   Kidney disease 02/04/2019   Personal history of Methicillin resistant Staphylococcus aureus infection 09/11/2018   Encounter for immunization 09/07/2017   Dependence on renal dialysis ([place]) 03/07/2017   Iron deficiency anemia, unspecified 11/15/2015   Coagulation defect, unspecified ([place]) 10/08/2015   Gastrointestinal hemorrhage, unspecified 10/08/2015   Pain, unspecified 10/08/2015   Secondary hyperparathyroidism of renal origin ([place]) 10/08/2015   End stage renal disease ([place]) 10/17/2013   Anemia in chronic kidney disease 08/30/2013   Hepatitis C 08/30/2013   ESRD on dialysis ([place]) 08/30/2013   Hypertensive crisis 08/28/2013   Hypertensive emergency 04/20/2013   Chronic combined systolic and diastolic congestive heart failure ([place]) 04/19/2013   HTN (hypertension) 04/19/2013   Past Medical History:  Diagnosis Date   Bacterial endocarditis 09/27/2020   MRSA   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 09/29/2020   Patient needs f/u CT chest 3 months   Chronic kidney disease    patient on dialysis tues-thurs-sat   Cirrhosis of liver (HCC)    Drug addiction ([place])    ETOH abuse    Hepatitis    Hypertension    Mitral regurgitation    Patent foramen ovale 10/23/2019   Renal disorder renal failure   S/P minimally-invasive mitral valve  replacement with bioprosthetic valve 10/14/2020   31 mm Medtronic Mosaic stented porcine bioprosthetic tissue valve via right mini thoracotomy approach   Sleep apnea    Systolic and diastolic CHF, 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[place]) 04/19/2013    Family History  Problem Relation Age of Onset   Hypertension Mother    Cancer Mother        breast    Past Surgical History:  Procedure Laterality Date   AMPUTATION Right 10/29/2020   Procedure: RIGHT ABOVE KNEE AMPUTATION;  Surgeon: [name], MD;  Location: [facility];  Service: Orthopedics;  Laterality: Right;   AV FISTULA PLACEMENT Right    AV FISTULA PLACEMENT Left 09/03/2013   Procedure: ARTERIOVENOUS (AV) FISTULA CREATION - LEFT BRACHIOCEPHALIC;  Surgeon: Conrad Clemson, MD;  Location: [facility];  Service: Vascular;  Laterality: Left;  AV FISTULA PLACEMENT Left 10/30/2019   Procedure: INSERTION OF ARTERIOVENOUS (AV) GORE-TEX GRAFT ARM;  Surgeon: Rosetta Posner, MD;  Location: [facility];  Service: Vascular;  Laterality: Left;   BUBBLE STUDY  10/05/2020   Procedure: BUBBLE STUDY;  Surgeon: Pixie Casino, MD;  Location: [facility];  Service: Cardiovascular;;   I & D EXTREMITY Right 10/20/2020   Procedure: IRRIGATION AND DEBRIDEMENT OF LEG;  Surgeon: [name], MD;  Location: [facility];  Service: Orthopedics;  Laterality: Right;   INSERTION OF DIALYSIS CATHETER N/A 08/29/2013   Procedure: INSERTION OF DIALYSIS CATHETER;  Surgeon: Mal Misty, MD;  Location: [facility];  Service: Vascular;  Laterality: N/A;   INSERTION OF DIALYSIS CATHETER Left 10/30/2019   Procedure: INSERTION OF DIALYSIS CATHETER LEFT INTERNAL JUGULAR;  Surgeon: Rosetta Posner, MD;  Location: [facility];  Service: Vascular;  Laterality: Left;   LEFT HEART CATH AND CORONARY ANGIOGRAPHY N/A 10/07/2020   Procedure: LEFT HEART CATH AND CORONARY ANGIOGRAPHY;  Surgeon: Lorretta Harp, MD;  Location: [facility];  Service: Cardiovascular;  Laterality: N/A;   MITRAL VALVE REPAIR Right 10/14/2020   Procedure:  MINIMALLY INVASIVE MITRAL VALVE  REPLACEMENT(MVR) USING MEDTRONIC MOSAIC VALVE SIZE 31MM;  Surgeon: Rexene Alberts, MD;  Location: [facility];  Service: Open Heart Surgery;  Laterality: Right;   MULTIPLE EXTRACTIONS WITH ALVEOLOPLASTY N/A 10/12/2020   Procedure: MULTIPLE EXTRACTION WITH ALVEOLOPLASTY;  Surgeon: [name], DMD;  Location: [facility];  Service: Dentistry;  Laterality: N/A;   PERIPHERAL VASCULAR CATHETERIZATION N/A 01/06/2015   Procedure: A/V Shuntogram/Fistulagram;  Surgeon: Katha Cabal, MD;  Location: [facility];  Service: Cardiovascular;  Laterality: N/A;   PERIPHERAL VASCULAR CATHETERIZATION Left 01/06/2015   Procedure: A/V Shunt Intervention;  Surgeon: Katha Cabal, MD;  Location: [facility];  Service: Cardiovascular;  Laterality: Left;   REPAIR OF PATENT FORAMEN OVALE N/A 10/14/2020   Procedure: CLOSURE OF PATENT FORAMEN OVALE;  Surgeon: Rexene Alberts, MD;  Location: [facility];  Service: Open Heart Surgery;  Laterality: N/A;   TEE WITHOUT CARDIOVERSION N/A 10/05/2020   Procedure: TRANSESOPHAGEAL ECHOCARDIOGRAM (TEE);  Surgeon: Pixie Casino, MD;  Location: [facility];  Service: Cardiovascular;  Laterality: N/A;   TEE WITHOUT CARDIOVERSION N/A 10/14/2020   Procedure: TRANSESOPHAGEAL ECHOCARDIOGRAM (TEE);  Surgeon: Rexene Alberts, MD;  Location: [facility];  Service: Open Heart Surgery;  Laterality: N/A;   TEE WITHOUT CARDIOVERSION N/A 12/24/2020   Procedure: TRANSESOPHAGEAL ECHOCARDIOGRAM (TEE);  Surgeon: Pixie Casino, MD;  Location: [facility];  Service: Cardiovascular;  Laterality: N/A;   Social History   Occupational History   Not on file  Tobacco Use   Smoking status: Former    Packs/day: 0.20    Pack years: 0.00    Types: Cigarettes    Quit date: 10/05/2017    Years since quitting: 3.2   Smokeless tobacco: Never   Tobacco comments:    Quit 3 years ago  Vaping Use   Vaping Use: Never used  Substance and Sexual Activity   Alcohol use: No    Drug use: No    Comment: Quit   Sexual activity: Not Currently    Birth control/protection: Condom

## 2021-01-14 ENCOUNTER — Other Ambulatory Visit: Payer: Self-pay

## 2021-01-14 ENCOUNTER — Non-Acute Institutional Stay: Payer: Medicaid Other | Admitting: Student

## 2021-01-14 DIAGNOSIS — D631 Anemia in chronic kidney disease: Secondary | ICD-10-CM | POA: Diagnosis not present

## 2021-01-14 DIAGNOSIS — D689 Coagulation defect, unspecified: Secondary | ICD-10-CM | POA: Diagnosis not present

## 2021-01-14 DIAGNOSIS — R000 Tachycardia, unspecified: Secondary | ICD-10-CM

## 2021-01-14 DIAGNOSIS — R7881 Bacteremia: Secondary | ICD-10-CM | POA: Diagnosis not present

## 2021-01-14 DIAGNOSIS — R52 Pain, unspecified: Secondary | ICD-10-CM | POA: Diagnosis not present

## 2021-01-14 DIAGNOSIS — N186 End stage renal disease: Secondary | ICD-10-CM | POA: Diagnosis not present

## 2021-01-14 DIAGNOSIS — Z515 Encounter for palliative care: Secondary | ICD-10-CM

## 2021-01-14 DIAGNOSIS — Z992 Dependence on renal dialysis: Secondary | ICD-10-CM | POA: Diagnosis not present

## 2021-01-14 DIAGNOSIS — N2581 Secondary hyperparathyroidism of renal origin: Secondary | ICD-10-CM | POA: Diagnosis not present

## 2021-01-14 DIAGNOSIS — I330 Acute and subacute infective endocarditis: Secondary | ICD-10-CM | POA: Diagnosis not present

## 2021-01-14 DIAGNOSIS — R630 Anorexia: Secondary | ICD-10-CM | POA: Diagnosis not present

## 2021-01-14 DIAGNOSIS — D509 Iron deficiency anemia, unspecified: Secondary | ICD-10-CM | POA: Diagnosis not present

## 2021-01-14 DIAGNOSIS — E875 Hyperkalemia: Secondary | ICD-10-CM | POA: Diagnosis not present

## 2021-01-14 NOTE — Progress Notes (Signed)
[facility] Consult Note Telephone: [phone]  Fax: [phone]    Date of encounter: 01/14/21 PATIENT NAME: Patrick Anthony [id number]   [phone] (home)  DOB: [birth year] MRN: [id number] PRIMARY CARE PROVIDER:    Dr. Westley Gambles  REFERRING PROVIDER:   Vennie Homans, NP  RESPONSIBLE PARTY:    Contact Information     Name Relation Home Work Laona, [place] Daughter   [phone]   Jerl, [place] Daughter   [phone]   Grayton, Lobo   [phone]        I met face to face with patient in the facility. Palliative Care was asked to follow this patient by consultation request of  Vennie Homans, NP to address advance care planning and complex medical decision making. This is a follow up visit.                                   ASSESSMENT AND PLAN / RECOMMENDATIONS:   Advance Care Planning/Goals of Care: Goals include to maximize quality of life and symptom management. Our advance care planning conversation included a discussion about:    The value and importance of advance care planning  Experiences with loved ones who have been seriously ill or have died  Exploration of personal, cultural or spiritual beliefs that might influence medical decisions  Exploration of goals of care in the event of a sudden injury or illness  MOST form reviewed; no changes. CODE STATUS: Full Code  Symptom Management/Plan:  ESRD-patient currently receiving hemodialysis on Monday/Wednesday/Friday. Continue HD as directed. Continue cinacalect, sevelamer as directed. Follow up with Nephrology as scheduled.   Sepsis-due to bacteremia complicated by prosthetic mitral valve endocarditis, T10-T11 discitis osteomyelitis. Continue Vancomycin, rifampin x 6 weeks through 02/18/21. Gentamicin as directed through 01/21/21. Follow up with ID, cardiology as scheduled.   Pain-patient with back pain, muscle  spasms, generalized pain. S/p right AKA. Continue oxycodone 41m BID, pregabalin 755mdaily, methocarbamol 50060mID for muscle spasms.  Poor appetite-albumin 2.2-continue Renal diet, Nepro nutritional supplement TID and liquid protein 30 ml BID in between meals.   Tachycardia-patient pulse 124 upon assessment; patient had not received Cardizem or metoprolol. Staff to administer and recheck VS in 1 hour. Nursing staff notifying facility provider of current VS.   Follow up Palliative Care Visit: Palliative care will continue to follow for complex medical decision making, advance care planning, and clarification of goals. Return in 8 weeks or prn.   This visit was coded based on medical decision making (MDM).  PPS: 40%  HOSPICE ELIGIBILITY/DIAGNOSIS: TBD  Chief Complaint: Palliative Medicine follow up visit.   HISTORY OF PRESENT ILLNESS:  [name] a [age]o. 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 Lengthy hospitalization 10/02/41/32/69/5.18omplicated stay with sepsis, pneumonia, Mrsa bacteremia and acute bacterial endocarditis. 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 Sepsis resolved; currently receiving 6 weeks of Vancomycin, rifampin through 02/18/21. Gentamicin through 01/21/21.   [facility]. He is receiving PT. Patient reports feeling tired today; he has just returned from dialysis. He does report generalized pain.  He denies any chest pain, palpitations, shortness of breath, nausea. Endorses poor appetite.  History obtained from review of EMR, discussion with primary team, and interview with family, facility staff/caregiver and/or Mr.  Patrick Anthony.  I reviewed available labs, medications, imaging, studies and related documents from the EMR.  Records reviewed and summarized above.   ROS  General: NAD EYES: denies vision changes ENMT: denies dysphagia Cardiovascular: denies chest pain, denies DOE Pulmonary: denies cough, denies increased SOB Abdomen: endorses poor appetite, denies constipation, endorses continence of bowel GU: anuric MSK: weakness,  no falls reported Skin: denies rashes or wounds Neurological: denies insomnia Psych: Endorses stable mood Heme/lymph/immuno: denies bruises, abnormal bleeding  Physical Exam: Weight: 159.3 pounds Pulse 124, resp 16, b/p 138/84, sats 94% on room air Constitutional: NAD General: frail appearing EYES: anicteric sclera, lids intact, no discharge  ENMT: intact hearing, oral mucous membranes moist CV: S1S2, tachycardic, no LE edema Pulmonary: LCTA, no increased work of breathing, no cough, room air Abdomen: normo-active BS + 4 quadrants, soft and non tender GU: deferred MSK: w/c for locomotion Skin: warm and dry, no rashes or wounds on visible skin, right AKA Neuro: generalized weakness, A & O x 2, forgetful Psych: non-anxious affect Hem/lymph/immuno: no widespread bruising   Thank you for the opportunity to participate in the care of [name].  The palliative care team will continue to follow. Please call our office at [phone] if we can be of additional assistance.   Ezekiel Slocumb, NP   COVID-19 PATIENT SCREENING TOOL Asked and negative response unless otherwise noted:   Have you had symptoms of covid, tested positive or been in contact with someone with symptoms/positive test in the past 5-10 days? No

## 2021-01-17 DIAGNOSIS — N2581 Secondary hyperparathyroidism of renal origin: Secondary | ICD-10-CM | POA: Diagnosis not present

## 2021-01-17 DIAGNOSIS — R7881 Bacteremia: Secondary | ICD-10-CM | POA: Diagnosis not present

## 2021-01-17 DIAGNOSIS — Z992 Dependence on renal dialysis: Secondary | ICD-10-CM | POA: Diagnosis not present

## 2021-01-17 DIAGNOSIS — D689 Coagulation defect, unspecified: Secondary | ICD-10-CM | POA: Diagnosis not present

## 2021-01-17 DIAGNOSIS — N186 End stage renal disease: Secondary | ICD-10-CM | POA: Diagnosis not present

## 2021-01-17 DIAGNOSIS — E875 Hyperkalemia: Secondary | ICD-10-CM | POA: Diagnosis not present

## 2021-01-19 DIAGNOSIS — E875 Hyperkalemia: Secondary | ICD-10-CM | POA: Diagnosis not present

## 2021-01-19 DIAGNOSIS — N186 End stage renal disease: Secondary | ICD-10-CM | POA: Diagnosis not present

## 2021-01-19 DIAGNOSIS — Z992 Dependence on renal dialysis: Secondary | ICD-10-CM | POA: Diagnosis not present

## 2021-01-19 DIAGNOSIS — D689 Coagulation defect, unspecified: Secondary | ICD-10-CM | POA: Diagnosis not present

## 2021-01-19 DIAGNOSIS — R7881 Bacteremia: Secondary | ICD-10-CM | POA: Diagnosis not present

## 2021-01-19 DIAGNOSIS — N2581 Secondary hyperparathyroidism of renal origin: Secondary | ICD-10-CM | POA: Diagnosis not present

## 2021-01-20 ENCOUNTER — Telehealth: Payer: Self-pay | Admitting: *Deleted

## 2021-01-20 DIAGNOSIS — Z20828 Contact with and (suspected) exposure to other viral communicable diseases: Secondary | ICD-10-CM | POA: Diagnosis not present

## 2021-01-20 NOTE — Telephone Encounter (Signed)
Labs being faxed from [facility]. Landis Gandy, RN

## 2021-01-21 ENCOUNTER — Ambulatory Visit (INDEPENDENT_AMBULATORY_CARE_PROVIDER_SITE_OTHER): Payer: Medicaid Other | Admitting: Infectious Diseases

## 2021-01-21 ENCOUNTER — Other Ambulatory Visit: Payer: Self-pay

## 2021-01-21 VITALS — BP 103/78 | HR 105 | Temp 98.9°F | Ht 72.0 in | Wt 149.0 lb

## 2021-01-21 DIAGNOSIS — N186 End stage renal disease: Secondary | ICD-10-CM | POA: Diagnosis not present

## 2021-01-21 DIAGNOSIS — B182 Chronic viral hepatitis C: Secondary | ICD-10-CM | POA: Diagnosis not present

## 2021-01-21 DIAGNOSIS — E875 Hyperkalemia: Secondary | ICD-10-CM | POA: Diagnosis not present

## 2021-01-21 DIAGNOSIS — Z5181 Encounter for therapeutic drug level monitoring: Secondary | ICD-10-CM | POA: Diagnosis not present

## 2021-01-21 DIAGNOSIS — I330 Acute and subacute infective endocarditis: Secondary | ICD-10-CM

## 2021-01-21 DIAGNOSIS — M4644 Discitis, unspecified, thoracic region: Secondary | ICD-10-CM

## 2021-01-21 DIAGNOSIS — Z992 Dependence on renal dialysis: Secondary | ICD-10-CM | POA: Diagnosis not present

## 2021-01-21 DIAGNOSIS — N2581 Secondary hyperparathyroidism of renal origin: Secondary | ICD-10-CM | POA: Diagnosis not present

## 2021-01-21 DIAGNOSIS — R7881 Bacteremia: Secondary | ICD-10-CM | POA: Diagnosis not present

## 2021-01-21 DIAGNOSIS — D689 Coagulation defect, unspecified: Secondary | ICD-10-CM | POA: Diagnosis not present

## 2021-01-21 NOTE — Progress Notes (Signed)
[facility]                                                             [place], [place], [place], [address]                                                                  Phn. 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 rifampin for remaining 6 weeks End date 02/18/21 Will request labs for medication monitoring Will get fibro test and ultrasound abdomen with elastography for planning treatment for hepatitis C Follow-up in 3 weeks, we will plan on getting a TTE at the end of treatment Follow-up with TCTS  All questions and concerns were discussed and addressed. Patient verbalized understanding of the plan. ____________________________________________________________________________________________________________________  HPI/Interval events 01/21/21 Here for hospital follow-up for prosthetic valve endocarditis/MRSA bacteremia/thoracic discitis and osteomyelitis.  He has completed his first 2 weeks of gentamicin and is currently on. vancomycin and  rifampin.  Denies any issues with the antibiotics like nausea vomiting abdominal pain diarrhea rashes.  Denies any fever chills sweats.  Denies any chest pain shortness of breath or cough.  Appetite is good but he does not like to eat much because he does not like the food at the rehab.  Denies any changes in the weight.  He says he has never been treated for hepatitis C in the past and is willing to be treated for hepatitis C after completion of treatment for MRSA infection.  He is also aware that he has to follow-up with T CTS.   Back pain has resolved.  Denies any new numbness weakness or tingling sensation. Overall feels good and no complaints today  ROS: Constitutional: Negative for fever, chills, activity change, appetite  change, fatigue and unexpected weight change.  Negative for N/V/D Negative for chest pain/SOB   Past Medical History:  Diagnosis Date   Bacterial endocarditis 09/27/2020   MRSA   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 09/29/2020   Patient needs f/u CT chest 3 months   Chronic kidney disease    patient on dialysis tues-thurs-sat   Cirrhosis of liver ([place])    Drug addiction ([place])    ETOH abuse    Hepatitis    Hypertension    Mitral regurgitation    Patent foramen ovale 10/23/2019   Renal disorder renal failure   S/P minimally-invasive mitral valve replacement with bioprosthetic valve 10/14/2020   31 mm Medtronic Mosaic stented porcine bioprosthetic tissue valve via right mini thoracotomy approach   Sleep apnea    Systolic and diastolic CHF, 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   Past Surgical History:  Procedure Laterality Date   AMPUTATION Right 10/29/2020   Procedure: RIGHT ABOVE KNEE AMPUTATION;  Surgeon: [name], MD;  Location: [facility];  Service: Orthopedics;  Laterality: Right;   AV FISTULA PLACEMENT Right    AV FISTULA PLACEMENT Left 09/03/2013   Procedure: ARTERIOVENOUS (AV) FISTULA CREATION - LEFT BRACHIOCEPHALIC;  Surgeon: Conrad Mehlville, MD;   Location: [place] [facility];  Service: Vascular;  Laterality: Left;   AV FISTULA PLACEMENT Left 10/30/2019   Procedure: INSERTION OF ARTERIOVENOUS (AV) GORE-TEX GRAFT ARM;  Surgeon: Rosetta Posner, MD;  Location: [facility];  Service: Vascular;  Laterality: Left;   BUBBLE STUDY  10/05/2020   Procedure: BUBBLE STUDY;  Surgeon: Pixie Casino, MD;  Location: [facility];  Service: Cardiovascular;;   I & D EXTREMITY Right 10/20/2020   Procedure: IRRIGATION AND DEBRIDEMENT OF LEG;  Surgeon: [name], MD;  Location: [facility];  Service: Orthopedics;  Laterality: Right;   INSERTION OF DIALYSIS CATHETER N/A 08/29/2013   Procedure: INSERTION OF DIALYSIS CATHETER;  Surgeon: Mal Misty, MD;  Location: [facility];  Service: Vascular;  Laterality: N/A;   INSERTION OF DIALYSIS CATHETER Left 10/30/2019   Procedure: INSERTION OF DIALYSIS CATHETER LEFT INTERNAL JUGULAR;  Surgeon: Rosetta Posner, MD;  Location: [facility];  Service: Vascular;  Laterality: Left;   LEFT HEART CATH AND CORONARY ANGIOGRAPHY N/A 10/07/2020   Procedure: LEFT HEART CATH AND CORONARY ANGIOGRAPHY;  Surgeon: Lorretta Harp, MD;  Location: [facility];  Service: Cardiovascular;  Laterality: N/A;   MITRAL VALVE REPAIR Right 10/14/2020   Procedure: MINIMALLY INVASIVE MITRAL VALVE  REPLACEMENT(MVR) USING MEDTRONIC MOSAIC VALVE SIZE 31MM;  Surgeon: Rexene Alberts, MD;  Location: [facility];  Service: Open Heart Surgery;  Laterality: Right;   MULTIPLE EXTRACTIONS WITH ALVEOLOPLASTY N/A 10/12/2020   Procedure: MULTIPLE EXTRACTION WITH ALVEOLOPLASTY;  Surgeon: [name], DMD;  Location: [facility];  Service: Dentistry;  Laterality: N/A;   PERIPHERAL VASCULAR CATHETERIZATION N/A 01/06/2015   Procedure: A/V Shuntogram/Fistulagram;  Surgeon: Katha Cabal, MD;  Location: [facility];  Service: Cardiovascular;  Laterality: N/A;   PERIPHERAL VASCULAR CATHETERIZATION Left 01/06/2015   Procedure: A/V Shunt Intervention;  Surgeon: Katha Cabal, MD;  Location:  [facility];  Service: Cardiovascular;  Laterality: Left;   REPAIR OF PATENT FORAMEN OVALE N/A 10/14/2020   Procedure: CLOSURE OF PATENT FORAMEN OVALE;  Surgeon: Rexene Alberts, MD;  Location: [facility];  Service: Open Heart Surgery;  Laterality: N/A;   TEE WITHOUT CARDIOVERSION N/A 10/05/2020   Procedure: TRANSESOPHAGEAL ECHOCARDIOGRAM (TEE);  Surgeon: Debara Pickett,  Nadean Corwin, MD;  [facility];  Service: Cardiovascular;  Laterality: N/A;   TEE WITHOUT CARDIOVERSION N/A 10/14/2020   Procedure: TRANSESOPHAGEAL ECHOCARDIOGRAM (TEE);  Surgeon: Rexene Alberts, MD;  Location: [facility];  Service: Open Heart Surgery;  Laterality: N/A;   TEE WITHOUT CARDIOVERSION N/A 12/24/2020   Procedure: TRANSESOPHAGEAL ECHOCARDIOGRAM (TEE);  Surgeon: Pixie Casino, MD;  Location: [facility];  Service: Cardiovascular;  Laterality: N/A;   Current Outpatient Medications on File Prior to Visit  Medication Sig Dispense Refill   Nutritional Supplements (FEEDING SUPPLEMENT, NEPRO CARB STEADY,) LIQD Take 237 mLs by mouth 3 (three) times daily between meals.  0   rifampin (RIFADIN) 300 MG capsule Take 1 capsule (300 mg total) by mouth every 8 (eight) hours. 6 weeks from 5/27     Vancomycin (VANCOCIN) 750-5 MG/150ML-% SOLN Inject 150 mLs (750 mg total) into the vein every Monday, Wednesday, and Friday with hemodialysis. 6 weeks of vancomycin from 5/27 4000 mL    acetaminophen (TYLENOL) 325 MG tablet Take 650 mg by mouth every 6 (six) hours as needed for mild pain. Temp > 100.5     aspirin EC 325 MG EC tablet Take 1 tablet (325 mg total) by mouth daily. 30 tablet 0   bisacodyl (DULCOLAX) 10 MG suppository Place 1 suppository (10 mg total) rectally daily as needed for moderate constipation. 12 suppository 0   cinacalcet (SENSIPAR) 30 MG tablet Take 2 tablets (60 mg total) by mouth daily with breakfast. 60 tablet    Darbepoetin Alfa (ARANESP, ALBUMIN FREE, IJ) Darbepoetin Alfa (Aranesp)     diltiazem (CARDIZEM CD) 120  MG 24 hr capsule Take 1 capsule (120 mg total) by mouth daily.     docusate sodium (COLACE) 100 MG capsule Take 1 capsule (100 mg total) by mouth 2 (two) times daily. 10 capsule 0   methocarbamol (ROBAXIN) 500 MG tablet Take 1 tablet (500 mg total) by mouth 3 (three) times daily.     metoprolol tartrate (LOPRESSOR) 50 MG tablet Take 0.5 tablets (25 mg total) by mouth 2 (two) times daily.     Multiple Vitamin (MULTIVITAMIN WITH MINERALS) TABS tablet Take 1 tablet by mouth daily.     nitroGLYCERIN (NITROSTAT) 0.4 MG SL tablet Place 0.4 mg under the tongue every 5 (five) minutes x 3 doses as needed for chest pain.      Nutritional Supplements (,FEEDING SUPPLEMENT, PROSOURCE PLUS) liquid Take 30 mLs by mouth 2 (two) times daily between meals.     oxyCODONE (OXYCONTIN) 15 mg 12 hr tablet Take 1 tablet (15 mg total) by mouth every 12 (twelve) hours. 14 tablet 0   pantoprazole (PROTONIX) 40 MG tablet Take 1 tablet (40 mg total) by mouth daily.     polyethylene glycol (MIRALAX / GLYCOLAX) 17 g packet Take 17 g by mouth daily as needed for mild constipation. 14 each 0   pregabalin (LYRICA) 75 MG capsule Take 1 capsule (75 mg total) by mouth at bedtime. 60 capsule 0   senna-docusate (SENOKOT-S) 8.6-50 MG tablet Take 1 tablet by mouth 2 (two) times daily.     sevelamer carbonate (RENVELA) 800 MG tablet Take 1,600 mg by mouth 3 (three) times daily with meals.     Current Facility-Administered Medications on File Prior to Visit  Medication Dose Route Frequency Provider Last Rate Last Admin   regadenoson (LEXISCAN) injection SOLN 0.4 mg  0.4 mg Intravenous Once Elouise Munroe, MD       technetium tetrofosmin (TC-MYOVIEW) injection 10.6  millicurie  [id number] millicurie Intravenous Once PRN Elouise Munroe, MD       technetium tetrofosmin (TC-MYOVIEW) injection [id number] millicurie  [id number] millicurie Intravenous Once PRN Elouise Munroe, MD        No Known Allergies  Social History   Socioeconomic History    Marital status: Single    Spouse name: Not on file   Number of children: Not on file   Years of education: Not on file   Highest education level: Not on file  Occupational History   Not on file  Tobacco Use   Smoking status: Former    Packs/day: 0.20    Pack years: 0.00    Types: Cigarettes    Quit date: 10/05/2017    Years since quitting: 3.2   Smokeless tobacco: Never   Tobacco comments:    Quit 3 years ago  Vaping Use   Vaping Use: Never used  Substance and Sexual Activity   Alcohol use: No   Drug use: No    Comment: Quit   Sexual activity: Not Currently    Birth control/protection: Condom  Other Topics Concern   Not on file  Social History Narrative   ** Merged History Encounter **       Social Determinants of Health   Financial Resource Strain: Not on file  Food Insecurity: Not on file  Transportation Needs: Not on file  Physical Activity: Not on file  Stress: Not on file  Social Connections: Not on file  Intimate Partner Violence: Not on file     Vitals BP 103/78   Pulse (!) 105   Temp 98.9 F (37.2 C) (Oral)   Ht 6' (1.829 m)   Wt 149 lb (67.6 kg)   SpO2 94%   BMI 20.21 kg/m     Examination  General - not in acute distress, comfortably sitting in chair HEENT - PEERLA, no pallor and no icterus Chest - b/l clear air entry, no additional sounds CVS- Normal s1s2, NO CHANGE IN SYSTOLIC MURMUR Abdomen - Soft, Non tender , non distended Ext- no pedal edema, RT AKA Neuro: grossly normal Back - WNL Psych : calm and cooperative   Recent labs CBC Latest Ref Rng & Units 01/06/2021 01/05/2021 01/04/2021  WBC 4.0 - 10.5 K/uL 18.3(H) 31.0(H) 24.6(H)  Hemoglobin 13.0 - 17.0 g/dL 10.8(L) 10.8(L) 10.7(L)  Hematocrit 39.0 - 52.0 % 35.2(L) 35.0(L) 36.2(L)  Platelets 150 - 400 K/uL 423(H) 456(H) 367   CMP Latest Ref Rng & Units 01/05/2021 01/03/2021 01/01/2021  Glucose 70 - 99 mg/dL 81 105(H) 86  BUN 6 - 20 mg/dL 67(H) 62(H) 21(H)  Creatinine 0.61 - 1.24 mg/dL  8.03(H) 8.50(H) 4.32(H)  Sodium 135 - 145 mmol/L 128(L) 130(L) 133(L)  Potassium 3.5 - 5.1 mmol/L 5.1 4.5 4.1  Chloride 98 - 111 mmol/L 90(L) 91(L) 96(L)  CO2 22 - 32 mmol/L '23 22 25  '$ Calcium 8.9 - 10.3 mg/dL 9.2 9.2 9.1  Total Protein 6.5 - 8.1 g/dL - - -  Total Bilirubin 0.3 - 1.2 mg/dL - - -  Alkaline Phos 38 - 126 U/L - - -  AST 15 - 41 U/L - - -  ALT 0 - 44 U/L - - -     Pertinent Microbiology Results for orders placed or performed during the hospital encounter of 12/22/20  Resp Panel by RT-PCR (Flu A&B, Covid) Nasopharyngeal Swab     Status: None   Collection Time: 12/22/20  7:14 AM   Specimen: Nasopharyngeal Swab;  Nasopharyngeal(NP) swabs in vial transport medium  Result Value Ref Range Status   SARS Coronavirus 2 by RT PCR NEGATIVE NEGATIVE Final    Comment: (NOTE) SARS-CoV-2 target nucleic acids are NOT DETECTED.  The SARS-CoV-2 RNA is generally detectable in upper respiratory specimens during the acute phase of infection. The lowest concentration of SARS-CoV-2 viral copies this assay can detect is 138 copies/mL. A negative result does not preclude SARS-Cov-2 infection and should not be used as the sole basis for treatment or other patient management decisions. A negative result may occur with  improper specimen collection/handling, submission of specimen other than nasopharyngeal swab, presence of viral mutation(s) within the areas targeted by this assay, and inadequate number of viral copies(<138 copies/mL). A negative result must be combined with clinical observations, patient history, and epidemiological information. The expected result is Negative.  Fact Sheet for Patients:  EntrepreneurPulse.com.au  Fact Sheet for Healthcare Providers:  IncredibleEmployment.be  This test is no t yet approved or cleared by the [place] FDA and  has been authorized for detection and/or diagnosis of SARS-CoV-2 by FDA under an Emergency  Use Authorization (EUA). This EUA will remain  in effect (meaning this test can be used) for the duration of the COVID-19 declaration under Section 564(b)(1) of the Act, 21 U.S.C.section 360bbb-3(b)(1), unless the authorization is terminated  or revoked sooner.       Influenza A by PCR NEGATIVE NEGATIVE Final   Influenza B by PCR NEGATIVE NEGATIVE Final    Comment: (NOTE) The Xpert Xpress SARS-CoV-2/FLU/RSV plus assay is intended as an aid in the diagnosis of influenza from Nasopharyngeal swab specimens and should not be used as a sole basis for treatment. Nasal washings and aspirates are unacceptable for Xpert Xpress SARS-CoV-2/FLU/RSV testing.  Fact Sheet for Patients: EntrepreneurPulse.com.au  Fact Sheet for Healthcare Providers: IncredibleEmployment.be  This test is not yet approved or cleared by the [place] FDA and has been authorized for detection and/or diagnosis of SARS-CoV-2 by FDA under an Emergency Use Authorization (EUA). This EUA will remain in effect (meaning this test can be used) for the duration of the COVID-19 declaration under Section 564(b)(1) of the Act, 21 U.S.C. section 360bbb-3(b)(1), unless the authorization is terminated or revoked.  Performed at [facility], [place] [address] [place], [place] [address]   Blood Culture (routine x 2)     Status: Abnormal   Collection Time: 12/22/20  7:14 AM   Specimen: BLOOD  Result Value Ref Range Status   Specimen Description BLOOD SITE NOT SPECIFIED  Final   Special Requests   Final    BOTTLES DRAWN AEROBIC AND ANAEROBIC Blood Culture adequate volume   Culture  Setup Time   Final    GRAM POSITIVE COCCI IN CLUSTERS IN BOTH AEROBIC AND ANAEROBIC BOTTLES CRITICAL RESULT CALLED TO, READ BACK BY AND VERIFIED WITHDion Body [facility] 2028 12/22/20 A BROWNING Performed at [facility], [place] [address], [place], [place] [address]    Culture METHICILLIN RESISTANT  STAPHYLOCOCCUS AUREUS (A)  Final   Report Status 12/24/2020 FINAL  Final   Organism ID, Bacteria METHICILLIN RESISTANT STAPHYLOCOCCUS AUREUS  Final      Susceptibility   Methicillin resistant staphylococcus aureus - MIC*    CIPROFLOXACIN 4 RESISTANT Resistant     ERYTHROMYCIN >=8 RESISTANT Resistant     GENTAMICIN <=0.5 SENSITIVE Sensitive     OXACILLIN >=4 RESISTANT Resistant     TETRACYCLINE <=1 SENSITIVE Sensitive     VANCOMYCIN 1 SENSITIVE  Sensitive     TRIMETH/SULFA <=10 SENSITIVE Sensitive     CLINDAMYCIN <=0.25 SENSITIVE Sensitive     RIFAMPIN <=0.5 SENSITIVE Sensitive     Inducible Clindamycin NEGATIVE Sensitive     * METHICILLIN RESISTANT STAPHYLOCOCCUS AUREUS  Blood Culture ID Panel (Reflexed)     Status: Abnormal   Collection Time: 12/22/20  7:14 AM  Result Value Ref Range Status   Enterococcus faecalis NOT DETECTED NOT DETECTED Final   Enterococcus Faecium NOT DETECTED NOT DETECTED Final   Listeria monocytogenes NOT DETECTED NOT DETECTED Final   Staphylococcus species DETECTED (A) NOT DETECTED Final    Comment: CRITICAL RESULT CALLED TO, READ BACK BY AND VERIFIED WITH: J [facility] PHARMD 2028 12/22/20 A BROWNING    Staphylococcus aureus (BCID) DETECTED (A) NOT DETECTED Final    Comment: Methicillin (oxacillin)-resistant Staphylococcus aureus (MRSA). MRSA is predictably resistant to beta-lactam antibiotics (except ceftaroline). Preferred therapy is vancomycin unless clinically contraindicated. Patient requires contact precautions if  hospitalized. CRITICAL RESULT CALLED TO, READ BACK BY AND VERIFIED WITH: Dion Body PHARMD 2028 12/22/20 A BROWNING    Staphylococcus epidermidis NOT DETECTED NOT DETECTED Final   Staphylococcus lugdunensis NOT DETECTED NOT DETECTED Final   Streptococcus species NOT DETECTED NOT DETECTED Final   Streptococcus agalactiae NOT DETECTED NOT DETECTED Final   Streptococcus pneumoniae NOT DETECTED NOT DETECTED Final   Streptococcus pyogenes NOT DETECTED  NOT DETECTED Final   A.calcoaceticus-baumannii NOT DETECTED NOT DETECTED Final   Bacteroides fragilis NOT DETECTED NOT DETECTED Final   Enterobacterales NOT DETECTED NOT DETECTED Final   Enterobacter cloacae complex NOT DETECTED NOT DETECTED Final   Escherichia coli NOT DETECTED NOT DETECTED Final   Klebsiella aerogenes NOT DETECTED NOT DETECTED Final   Klebsiella oxytoca NOT DETECTED NOT DETECTED Final   Klebsiella pneumoniae NOT DETECTED NOT DETECTED Final   Proteus species NOT DETECTED NOT DETECTED Final   Salmonella species NOT DETECTED NOT DETECTED Final   Serratia marcescens NOT DETECTED NOT DETECTED Final   Haemophilus influenzae NOT DETECTED NOT DETECTED Final   Neisseria meningitidis NOT DETECTED NOT DETECTED Final   Pseudomonas aeruginosa NOT DETECTED NOT DETECTED Final   Stenotrophomonas maltophilia NOT DETECTED NOT DETECTED Final   Candida albicans NOT DETECTED NOT DETECTED Final   Candida auris NOT DETECTED NOT DETECTED Final   Candida glabrata NOT DETECTED NOT DETECTED Final   Candida krusei NOT DETECTED NOT DETECTED Final   Candida parapsilosis NOT DETECTED NOT DETECTED Final   Candida tropicalis NOT DETECTED NOT DETECTED Final   Cryptococcus neoformans/gattii NOT DETECTED NOT DETECTED Final   Meth resistant mecA/C and MREJ DETECTED (A) NOT DETECTED Final    Comment: CRITICAL RESULT CALLED TO, READ BACK BY AND VERIFIED WITHDion Body [facility] 2028 12/22/20 A BROWNING Performed at [facility], [address] [place], [place] [address]   Blood Culture (routine x 2)     Status: Abnormal   Collection Time: 12/22/20  7:19 AM   Specimen: BLOOD  Result Value Ref Range Status   Specimen Description BLOOD SITE NOT SPECIFIED  Final   Special Requests   Final    BOTTLES DRAWN AEROBIC AND ANAEROBIC Blood Culture results may not be optimal due to an excessive volume of blood received in culture bottles   Culture  Setup Time   Final    GRAM POSITIVE COCCI IN CLUSTERS IN  BOTH AEROBIC AND ANAEROBIC BOTTLES IDENTIFICATION TO FOLLOW CRITICAL RESULT CALLED TO, READ BACK BY AND VERIFIED WITH: J [facility] 2028 12/22/20  A BROWNING    Culture (A)  Final    STAPHYLOCOCCUS AUREUS SUSCEPTIBILITIES PERFORMED ON PREVIOUS CULTURE WITHIN THE LAST 5 DAYS. Performed at [facility], [place] [address], [place], [place] [address]    Report Status 12/24/2020 FINAL  Final  Culture, blood (routine x 2)     Status: None   Collection Time: 12/24/20  3:10 PM   Specimen: BLOOD RIGHT HAND  Result Value Ref Range Status   Specimen Description BLOOD RIGHT HAND  Final   Special Requests   Final    BOTTLES DRAWN AEROBIC ONLY Blood Culture adequate volume   Culture   Final    NO GROWTH 5 DAYS Performed at [facility], [place] [address] [place], [place] [address]    Report Status 12/29/2020 FINAL  Final  Culture, blood (routine x 2)     Status: None   Collection Time: 12/24/20  3:37 PM   Specimen: BLOOD  Result Value Ref Range Status   Specimen Description BLOOD BLOOD RIGHT FOREARM  Final   Special Requests   Final    BOTTLES DRAWN AEROBIC ONLY Blood Culture adequate volume   Culture   Final    NO GROWTH 5 DAYS Performed at [facility], [place] [address] [place], [place] [address]    Report Status 12/29/2020 FINAL  Final  MRSA PCR Screening     Status: None   Collection Time: 12/25/20 10:18 PM   Specimen: Nasal Mucosa; Nasopharyngeal  Result Value Ref Range Status   MRSA by PCR NEGATIVE NEGATIVE Final    Comment:        The GeneXpert MRSA Assay (FDA approved for NASAL specimens only), is one component of a comprehensive MRSA colonization surveillance program. It is not intended to diagnose MRSA infection nor to guide or monitor treatment for MRSA infections. Performed at [facility], [place] [address] [place], [place] [address]   Culture, blood (routine x 2)     Status: Abnormal   Collection Time: 12/26/20 10:19 AM   Specimen: BLOOD RIGHT  HAND  Result Value Ref Range Status   Specimen Description BLOOD RIGHT HAND  Final   Special Requests   Final    BOTTLES DRAWN AEROBIC ONLY Blood Culture results may not be optimal due to an inadequate volume of blood received in culture bottles   Culture  Setup Time   Final    GRAM POSITIVE COCCI IN CLUSTERS AEROBIC BOTTLE ONLY CRITICAL RESULT CALLED TO, READ BACK BY AND VERIFIED WITH: PHARMD TM TUCKER [id number] AT 66 BY CM    Culture (A)  Final    STAPHYLOCOCCUS AUREUS SUSCEPTIBILITIES PERFORMED ON PREVIOUS CULTURE WITHIN THE LAST 5 DAYS. Performed at [facility], [place] [address] [place],  [address]    Report Status 12/29/2020 FINAL  Final  Culture, blood (routine x 2)     Status: Abnormal   Collection Time: 12/26/20 10:19 AM   Specimen: BLOOD RIGHT WRIST  Result Value Ref Range Status   Specimen Description BLOOD RIGHT WRIST  Final   Special Requests   Final    BOTTLES DRAWN AEROBIC ONLY Blood Culture results may not be optimal due to an inadequate volume of blood received in culture bottles   Culture  Setup Time   Final    GRAM POSITIVE COCCI IN CLUSTERS AEROBIC BOTTLE ONLY CRITICAL RESULT CALLED TO, READ BACK BY AND VERIFIED WITH: PHARMD M TUCKER [id number] AT 717 BY CM  Culture (A)  Final    METHICILLIN RESISTANT STAPHYLOCOCCUS AUREUS SEE SEPARATE REPORT Performed at [facility], [place] [address], [place], [place] [address]    Report Status 01/04/2021 FINAL  Final   Organism ID, Bacteria METHICILLIN RESISTANT STAPHYLOCOCCUS AUREUS  Final      Susceptibility   Methicillin resistant staphylococcus aureus - MIC*    CIPROFLOXACIN >=8 RESISTANT Resistant     ERYTHROMYCIN >=8 RESISTANT Resistant     GENTAMICIN <=0.5 SENSITIVE Sensitive     OXACILLIN >=4 RESISTANT Resistant     TETRACYCLINE <=1 SENSITIVE Sensitive     VANCOMYCIN 1 SENSITIVE Sensitive     TRIMETH/SULFA <=10 SENSITIVE Sensitive     CLINDAMYCIN <=0.25 SENSITIVE Sensitive     RIFAMPIN <=0.5  SENSITIVE Sensitive     Inducible Clindamycin NEGATIVE Sensitive     * METHICILLIN RESISTANT STAPHYLOCOCCUS AUREUS  Culture, blood (routine x 2)     Status: Abnormal   Collection Time: 12/27/20  2:17 PM   Specimen: BLOOD  Result Value Ref Range Status   Specimen Description BLOOD THUMB  Final   Special Requests   Final    BOTTLES DRAWN AEROBIC AND ANAEROBIC Blood Culture adequate volume   Culture  Setup Time   Final    GRAM POSITIVE COCCI ANAEROBIC BOTTLE ONLY CRITICAL VALUE NOTED.  VALUE IS CONSISTENT WITH PREVIOUSLY REPORTED AND CALLED VALUE.    Culture (A)  Final    STAPHYLOCOCCUS AUREUS SUSCEPTIBILITIES PERFORMED ON PREVIOUS CULTURE WITHIN THE LAST 5 DAYS. Performed at [facility], [place] [address] [place], [place] [address]    Report Status 01/02/2021 FINAL  Final  Culture, blood (routine x 2)     Status: Abnormal   Collection Time: 12/27/20  2:17 PM   Specimen: BLOOD RIGHT HAND  Result Value Ref Range Status   Specimen Description BLOOD RIGHT HAND  Final   Special Requests   Final    BOTTLES DRAWN AEROBIC AND ANAEROBIC Blood Culture adequate volume   Culture  Setup Time   Final    GRAM POSITIVE COCCI IN CLUSTERS ANAEROBIC BOTTLE ONLY CRITICAL VALUE NOTED.  VALUE IS CONSISTENT WITH PREVIOUSLY REPORTED AND CALLED VALUE. Performed at [facility], [place] [address], [place], [place] [address]    Culture METHICILLIN RESISTANT STAPHYLOCOCCUS AUREUS (A)  Final   Report Status 12/31/2020 FINAL  Final   Organism ID, Bacteria METHICILLIN RESISTANT STAPHYLOCOCCUS AUREUS  Final      Susceptibility   Methicillin resistant staphylococcus aureus - MIC*    CIPROFLOXACIN >=8 RESISTANT Resistant     ERYTHROMYCIN >=8 RESISTANT Resistant     GENTAMICIN <=0.5 SENSITIVE Sensitive     OXACILLIN >=4 RESISTANT Resistant     TETRACYCLINE <=1 SENSITIVE Sensitive     VANCOMYCIN <=0.5 SENSITIVE Sensitive     TRIMETH/SULFA <=10 SENSITIVE Sensitive     CLINDAMYCIN <=0.25 SENSITIVE  Sensitive     RIFAMPIN <=0.5 SENSITIVE Sensitive     Inducible Clindamycin NEGATIVE Sensitive     * METHICILLIN RESISTANT STAPHYLOCOCCUS AUREUS  Culture, blood (routine x 2)     Status: Abnormal   Collection Time: 12/29/20  1:43 PM   Specimen: BLOOD RIGHT HAND  Result Value Ref Range Status   Specimen Description BLOOD RIGHT HAND  Final   Special Requests   Final    BOTTLES DRAWN AEROBIC AND ANAEROBIC Blood Culture adequate volume   Culture  Setup Time   Final    GRAM POSITIVE COCCI ANAEROBIC BOTTLE ONLY CRITICAL  VALUE NOTED.  VALUE IS CONSISTENT WITH PREVIOUSLY REPORTED AND CALLED VALUE.    Culture (A)  Final    STAPHYLOCOCCUS AUREUS SUSCEPTIBILITIES PERFORMED ON PREVIOUS CULTURE WITHIN THE LAST 5 DAYS. Performed at [facility], [place] [address], [place], [place] [address]    Report Status 01/01/2021 FINAL  Final  Culture, blood (routine x 2)     Status: Abnormal   Collection Time: 12/29/20  1:43 PM   Specimen: BLOOD RIGHT HAND  Result Value Ref Range Status   Specimen Description BLOOD RIGHT HAND  Final   Special Requests   Final    BOTTLES DRAWN AEROBIC AND ANAEROBIC Blood Culture adequate volume   Culture  Setup Time   Final    GRAM POSITIVE COCCI IN CLUSTERS ANAEROBIC BOTTLE ONLY CRITICAL VALUE NOTED.  VALUE IS CONSISTENT WITH PREVIOUSLY REPORTED AND CALLED VALUE.    Culture (A)  Final    STAPHYLOCOCCUS AUREUS SUSCEPTIBILITIES PERFORMED ON PREVIOUS CULTURE WITHIN THE LAST 5 DAYS.    Report Status 01/03/2021 FINAL  Final  Culture, blood (routine x 2)     Status: None   Collection Time: 12/31/20  8:48 AM   Specimen: BLOOD RIGHT HAND  Result Value Ref Range Status   Specimen Description BLOOD RIGHT HAND  Final   Special Requests   Final    BOTTLES DRAWN AEROBIC ONLY Blood Culture results may not be optimal due to an excessive volume of blood received in culture bottles   Culture   Final    NO GROWTH 5 DAYS Performed at [facility], [place] [address],  [place], [place] [address]    Report Status 01/05/2021 FINAL  Final  Culture, blood (routine x 2)     Status: None   Collection Time: 12/31/20  8:48 AM   Specimen: BLOOD RIGHT FOREARM  Result Value Ref Range Status   Specimen Description BLOOD RIGHT FOREARM  Final   Special Requests   Final    BOTTLES DRAWN AEROBIC ONLY Blood Culture adequate volume   Culture   Final    NO GROWTH 5 DAYS Performed at [facility], [place] [address], [place], [place] [address]    Report Status 01/05/2021 FINAL  Final  Resp Panel by RT-PCR (Flu A&B, Covid) Nasopharyngeal Swab     Status: None   Collection Time: 01/06/21 10:57 AM   Specimen: Nasopharyngeal Swab; Nasopharyngeal(NP) swabs in vial transport medium  Result Value Ref Range Status   SARS Coronavirus 2 by RT PCR NEGATIVE NEGATIVE Final    Comment: (NOTE) SARS-CoV-2 target nucleic acids are NOT DETECTED.  The SARS-CoV-2 RNA is generally detectable in upper respiratory specimens during the acute phase of infection. The lowest concentration of SARS-CoV-2 viral copies this assay can detect is 138 copies/mL. A negative result does not preclude SARS-Cov-2 infection and should not be used as the sole basis for treatment or other patient management decisions. A negative result may occur with  improper specimen collection/handling, submission of specimen other than nasopharyngeal swab, presence of viral mutation(s) within the areas targeted by this assay, and inadequate number of viral copies(<138 copies/mL). A negative result must be combined with clinical observations, patient history, and epidemiological information. The expected result is Negative.  Fact Sheet for Patients:  EntrepreneurPulse.com.au  Fact Sheet for Healthcare Providers:  IncredibleEmployment.be  This test is no t yet approved or cleared by the [place] FDA and  has been authorized for detection and/or diagnosis of SARS-CoV-2 by FDA  under  an Emergency Use Authorization (EUA). This EUA will remain  in effect (meaning this test can be used) for the duration of the COVID-19 declaration under Section 564(b)(1) of the Act, 21 U.S.C.section 360bbb-3(b)(1), unless the authorization is terminated  or revoked sooner.       Influenza A by PCR NEGATIVE NEGATIVE Final   Influenza B by PCR NEGATIVE NEGATIVE Final    Comment: (NOTE) The Xpert Xpress SARS-CoV-2/FLU/RSV plus assay is intended as an aid in the diagnosis of influenza from Nasopharyngeal swab specimens and should not be used as a sole basis for treatment. Nasal washings and aspirates are unacceptable for Xpert Xpress SARS-CoV-2/FLU/RSV testing.  Fact Sheet for Patients: EntrepreneurPulse.com.au  Fact Sheet for Healthcare Providers: IncredibleEmployment.be  This test is not yet approved or cleared by the [place] FDA and has been authorized for detection and/or diagnosis of SARS-CoV-2 by FDA under an Emergency Use Authorization (EUA). This EUA will remain in effect (meaning this test can be used) for the duration of the COVID-19 declaration under Section 564(b)(1) of the Act, 21 U.S.C. section 360bbb-3(b)(1), unless the authorization is terminated or revoked.  Performed at [facility], [place] [address], [place], [place] [address]       Pertinent Imaging All pertinent labs/Imagings/notes reviewed. All pertinent plain films and CT images have been personally visualized and interpreted; radiology reports have been reviewed. Decision making incorporated into the Impression / Recommendations.  I have spent 60 minutes for this patient encounter including  review of prior medical records with greater than 50% of time in face to face counsel of the patient/discussing diagnostics and plan of care.   Electronically signed by:  Rosiland Oz, MD Infectious Disease Physician [facility] [address] [place], [place] [address] Phone: [phone]  Fax: [phone]

## 2021-01-23 ENCOUNTER — Inpatient Hospital Stay (HOSPITAL_COMMUNITY)
Admission: EM | Admit: 2021-01-23 | Discharge: 2021-01-29 | DRG: 314 | Disposition: A | Discharge status: Skilled Nursing Facility | Payer: Medicaid Other | Source: Skilled Nursing Facility | Attending: Student | Admitting: Student

## 2021-01-23 ENCOUNTER — Emergency Department (HOSPITAL_COMMUNITY): Payer: Medicaid Other

## 2021-01-23 ENCOUNTER — Other Ambulatory Visit: Payer: Self-pay | Admitting: Physician Assistant

## 2021-01-23 ENCOUNTER — Encounter (HOSPITAL_COMMUNITY): Payer: Self-pay

## 2021-01-23 ENCOUNTER — Other Ambulatory Visit: Payer: Self-pay

## 2021-01-23 DIAGNOSIS — R000 Tachycardia, unspecified: Secondary | ICD-10-CM | POA: Diagnosis not present

## 2021-01-23 DIAGNOSIS — T826XXD Infection and inflammatory reaction due to cardiac valve prosthesis, subsequent encounter: Secondary | ICD-10-CM | POA: Diagnosis not present

## 2021-01-23 DIAGNOSIS — R7881 Bacteremia: Secondary | ICD-10-CM | POA: Diagnosis not present

## 2021-01-23 DIAGNOSIS — Y831 Surgical operation with implant of artificial internal device as the cause of abnormal reaction of the patient, or of later complication, without mention of misadventure at the time of the procedure: Secondary | ICD-10-CM | POA: Diagnosis present

## 2021-01-23 DIAGNOSIS — K219 Gastro-esophageal reflux disease without esophagitis: Secondary | ICD-10-CM | POA: Diagnosis present

## 2021-01-23 DIAGNOSIS — I132 Hypertensive heart and chronic kidney disease with heart failure and with stage 5 chronic kidney disease, or end stage renal disease: Secondary | ICD-10-CM | POA: Diagnosis present

## 2021-01-23 DIAGNOSIS — I214 Non-ST elevation (NSTEMI) myocardial infarction: Secondary | ICD-10-CM

## 2021-01-23 DIAGNOSIS — R7989 Other specified abnormal findings of blood chemistry: Secondary | ICD-10-CM | POA: Diagnosis present

## 2021-01-23 DIAGNOSIS — Z681 Body mass index (BMI) 19 or less, adult: Secondary | ICD-10-CM

## 2021-01-23 DIAGNOSIS — I313 Pericardial effusion (noninflammatory): Secondary | ICD-10-CM | POA: Diagnosis not present

## 2021-01-23 DIAGNOSIS — Z7982 Long term (current) use of aspirin: Secondary | ICD-10-CM

## 2021-01-23 DIAGNOSIS — I330 Acute and subacute infective endocarditis: Secondary | ICD-10-CM | POA: Diagnosis not present

## 2021-01-23 DIAGNOSIS — E872 Acidosis, unspecified: Secondary | ICD-10-CM | POA: Diagnosis present

## 2021-01-23 DIAGNOSIS — E8889 Other specified metabolic disorders: Secondary | ICD-10-CM | POA: Diagnosis present

## 2021-01-23 DIAGNOSIS — Z992 Dependence on renal dialysis: Secondary | ICD-10-CM | POA: Diagnosis not present

## 2021-01-23 DIAGNOSIS — I5032 Chronic diastolic (congestive) heart failure: Secondary | ICD-10-CM | POA: Diagnosis not present

## 2021-01-23 DIAGNOSIS — I5042 Chronic combined systolic (congestive) and diastolic (congestive) heart failure: Secondary | ICD-10-CM | POA: Diagnosis present

## 2021-01-23 DIAGNOSIS — R079 Chest pain, unspecified: Secondary | ICD-10-CM | POA: Diagnosis not present

## 2021-01-23 DIAGNOSIS — B9689 Other specified bacterial agents as the cause of diseases classified elsewhere: Secondary | ICD-10-CM | POA: Diagnosis present

## 2021-01-23 DIAGNOSIS — R778 Other specified abnormalities of plasma proteins: Secondary | ICD-10-CM | POA: Diagnosis not present

## 2021-01-23 DIAGNOSIS — T826XXA Infection and inflammatory reaction due to cardiac valve prosthesis, initial encounter: Secondary | ICD-10-CM | POA: Diagnosis not present

## 2021-01-23 DIAGNOSIS — Z79899 Other long term (current) drug therapy: Secondary | ICD-10-CM

## 2021-01-23 DIAGNOSIS — I248 Other forms of acute ischemic heart disease: Secondary | ICD-10-CM | POA: Diagnosis present

## 2021-01-23 DIAGNOSIS — Z20822 Contact with and (suspected) exposure to covid-19: Secondary | ICD-10-CM | POA: Diagnosis present

## 2021-01-23 DIAGNOSIS — R6510 Systemic inflammatory response syndrome (SIRS) of non-infectious origin without acute organ dysfunction: Secondary | ICD-10-CM

## 2021-01-23 DIAGNOSIS — I517 Cardiomegaly: Secondary | ICD-10-CM | POA: Diagnosis not present

## 2021-01-23 DIAGNOSIS — Z89611 Acquired absence of right leg above knee: Secondary | ICD-10-CM | POA: Diagnosis not present

## 2021-01-23 DIAGNOSIS — Z8774 Personal history of (corrected) congenital malformations of heart and circulatory system: Secondary | ICD-10-CM

## 2021-01-23 DIAGNOSIS — Z8249 Family history of ischemic heart disease and other diseases of the circulatory system: Secondary | ICD-10-CM

## 2021-01-23 DIAGNOSIS — Z87891 Personal history of nicotine dependence: Secondary | ICD-10-CM

## 2021-01-23 DIAGNOSIS — M4644 Discitis, unspecified, thoracic region: Secondary | ICD-10-CM | POA: Diagnosis present

## 2021-01-23 DIAGNOSIS — E43 Unspecified severe protein-calorie malnutrition: Secondary | ICD-10-CM | POA: Diagnosis present

## 2021-01-23 DIAGNOSIS — A4102 Sepsis due to Methicillin resistant Staphylococcus aureus: Secondary | ICD-10-CM | POA: Diagnosis not present

## 2021-01-23 DIAGNOSIS — N186 End stage renal disease: Secondary | ICD-10-CM | POA: Diagnosis not present

## 2021-01-23 DIAGNOSIS — K7460 Unspecified cirrhosis of liver: Secondary | ICD-10-CM | POA: Diagnosis present

## 2021-01-23 DIAGNOSIS — I361 Nonrheumatic tricuspid (valve) insufficiency: Secondary | ICD-10-CM | POA: Diagnosis not present

## 2021-01-23 DIAGNOSIS — K7210 Chronic hepatic failure without coma: Secondary | ICD-10-CM | POA: Diagnosis not present

## 2021-01-23 DIAGNOSIS — R6520 Severe sepsis without septic shock: Secondary | ICD-10-CM | POA: Diagnosis present

## 2021-01-23 DIAGNOSIS — M4624 Osteomyelitis of vertebra, thoracic region: Secondary | ICD-10-CM | POA: Diagnosis present

## 2021-01-23 DIAGNOSIS — G4733 Obstructive sleep apnea (adult) (pediatric): Secondary | ICD-10-CM | POA: Diagnosis not present

## 2021-01-23 DIAGNOSIS — Z7189 Other specified counseling: Secondary | ICD-10-CM | POA: Diagnosis not present

## 2021-01-23 DIAGNOSIS — G894 Chronic pain syndrome: Secondary | ICD-10-CM | POA: Diagnosis present

## 2021-01-23 DIAGNOSIS — R0600 Dyspnea, unspecified: Secondary | ICD-10-CM | POA: Diagnosis not present

## 2021-01-23 DIAGNOSIS — Z8679 Personal history of other diseases of the circulatory system: Secondary | ICD-10-CM | POA: Diagnosis not present

## 2021-01-23 DIAGNOSIS — Z803 Family history of malignant neoplasm of breast: Secondary | ICD-10-CM

## 2021-01-23 DIAGNOSIS — Z952 Presence of prosthetic heart valve: Secondary | ICD-10-CM

## 2021-01-23 DIAGNOSIS — R0902 Hypoxemia: Secondary | ICD-10-CM | POA: Diagnosis not present

## 2021-01-23 DIAGNOSIS — I081 Rheumatic disorders of both mitral and tricuspid valves: Secondary | ICD-10-CM | POA: Diagnosis not present

## 2021-01-23 DIAGNOSIS — I071 Rheumatic tricuspid insufficiency: Secondary | ICD-10-CM | POA: Diagnosis not present

## 2021-01-23 DIAGNOSIS — I471 Supraventricular tachycardia: Secondary | ICD-10-CM | POA: Diagnosis not present

## 2021-01-23 DIAGNOSIS — I2690 Septic pulmonary embolism without acute cor pulmonale: Secondary | ICD-10-CM | POA: Diagnosis not present

## 2021-01-23 DIAGNOSIS — Z8614 Personal history of Methicillin resistant Staphylococcus aureus infection: Secondary | ICD-10-CM

## 2021-01-23 DIAGNOSIS — I38 Endocarditis, valve unspecified: Secondary | ICD-10-CM | POA: Diagnosis not present

## 2021-01-23 DIAGNOSIS — E119 Type 2 diabetes mellitus without complications: Secondary | ICD-10-CM

## 2021-01-23 DIAGNOSIS — J90 Pleural effusion, not elsewhere classified: Secondary | ICD-10-CM | POA: Diagnosis not present

## 2021-01-23 DIAGNOSIS — G8929 Other chronic pain: Secondary | ICD-10-CM | POA: Diagnosis not present

## 2021-01-23 DIAGNOSIS — B182 Chronic viral hepatitis C: Secondary | ICD-10-CM | POA: Diagnosis present

## 2021-01-23 DIAGNOSIS — Z515 Encounter for palliative care: Secondary | ICD-10-CM | POA: Diagnosis not present

## 2021-01-23 DIAGNOSIS — J811 Chronic pulmonary edema: Secondary | ICD-10-CM | POA: Diagnosis not present

## 2021-01-23 DIAGNOSIS — E8779 Other fluid overload: Secondary | ICD-10-CM | POA: Diagnosis not present

## 2021-01-23 DIAGNOSIS — R0789 Other chest pain: Secondary | ICD-10-CM | POA: Diagnosis not present

## 2021-01-23 DIAGNOSIS — D631 Anemia in chronic kidney disease: Secondary | ICD-10-CM | POA: Diagnosis present

## 2021-01-23 DIAGNOSIS — N2581 Secondary hyperparathyroidism of renal origin: Secondary | ICD-10-CM | POA: Diagnosis not present

## 2021-01-23 DIAGNOSIS — T82897A Other specified complication of cardiac prosthetic devices, implants and grafts, initial encounter: Secondary | ICD-10-CM | POA: Diagnosis not present

## 2021-01-23 DIAGNOSIS — A419 Sepsis, unspecified organism: Secondary | ICD-10-CM | POA: Diagnosis not present

## 2021-01-23 DIAGNOSIS — I050 Rheumatic mitral stenosis: Secondary | ICD-10-CM | POA: Diagnosis not present

## 2021-01-23 DIAGNOSIS — I10 Essential (primary) hypertension: Secondary | ICD-10-CM | POA: Diagnosis not present

## 2021-01-23 DIAGNOSIS — Y712 Prosthetic and other implants, materials and accessory cardiovascular devices associated with adverse incidents: Secondary | ICD-10-CM | POA: Diagnosis not present

## 2021-01-23 LAB — COMPREHENSIVE METABOLIC PANEL
ALT: 32 U/L (ref 0–44)
AST: 86 U/L — ABNORMAL HIGH (ref 15–41)
Albumin: 2.3 g/dL — ABNORMAL LOW (ref 3.5–5.0)
Alkaline Phosphatase: 82 U/L (ref 38–126)
Anion gap: 20 — ABNORMAL HIGH (ref 5–15)
BUN: 25 mg/dL — ABNORMAL HIGH (ref 6–20)
CO2: 22 mmol/L (ref 22–32)
Calcium: 8.1 mg/dL — ABNORMAL LOW (ref 8.9–10.3)
Chloride: 91 mmol/L — ABNORMAL LOW (ref 98–111)
Creatinine, Ser: 6.32 mg/dL — ABNORMAL HIGH (ref 0.61–1.24)
GFR, Estimated: 10 mL/min — ABNORMAL LOW (ref >60)
Glucose, Bld: 85 mg/dL (ref 70–99)
Potassium: 4.1 mmol/L (ref 3.5–5.1)
Sodium: 133 mmol/L — ABNORMAL LOW (ref 135–145)
Total Bilirubin: 3.9 mg/dL — ABNORMAL HIGH (ref 0.3–1.2)
Total Protein: 8.6 g/dL — ABNORMAL HIGH (ref 6.5–8.1)

## 2021-01-23 LAB — CBC WITH DIFFERENTIAL/PLATELET
Abs Immature Granulocytes: 0.18 10*3/uL — ABNORMAL HIGH (ref 0.00–0.07)
Basophils Absolute: 0.1 10*3/uL (ref 0.0–0.1)
Basophils Relative: 0 %
Eosinophils Absolute: 0 10*3/uL (ref 0.0–0.5)
Eosinophils Relative: 0 %
HCT: 33.8 % — ABNORMAL LOW (ref 39.0–52.0)
Hemoglobin: 9.4 g/dL — ABNORMAL LOW (ref 13.0–17.0)
Immature Granulocytes: 1 %
Lymphocytes Relative: 5 %
Lymphs Abs: 0.8 10*3/uL (ref 0.7–4.0)
MCH: 25.5 pg — ABNORMAL LOW (ref 26.0–34.0)
MCHC: 27.8 g/dL — ABNORMAL LOW (ref 30.0–36.0)
MCV: 91.6 fL (ref 80.0–100.0)
Monocytes Absolute: 1.8 10*3/uL — ABNORMAL HIGH (ref 0.1–1.0)
Monocytes Relative: 11 %
Neutro Abs: 14.6 10*3/uL — ABNORMAL HIGH (ref 1.7–7.7)
Neutrophils Relative %: 83 %
Platelets: 259 10*3/uL (ref 150–400)
RBC: 3.69 MIL/uL — ABNORMAL LOW (ref 4.22–5.81)
RDW: 25.4 % — ABNORMAL HIGH (ref 11.5–15.5)
WBC: 17.5 10*3/uL — ABNORMAL HIGH (ref 4.0–10.5)
nRBC: 0.2 % (ref 0.0–0.2)

## 2021-01-23 LAB — TROPONIN I (HIGH SENSITIVITY)
Troponin I (High Sensitivity): 1110 ng/L (ref <18)
Troponin I (High Sensitivity): 1159 ng/L (ref <18)

## 2021-01-23 LAB — RESP PANEL BY RT-PCR (FLU A&B, COVID) ARPGX2
Influenza A by PCR: NEGATIVE
Influenza B by PCR: NEGATIVE
SARS Coronavirus 2 by RT PCR: NEGATIVE

## 2021-01-23 LAB — APTT: aPTT: 45 seconds — ABNORMAL HIGH (ref 24–36)

## 2021-01-23 LAB — LACTIC ACID, PLASMA
Lactic Acid, Venous: 4.8 mmol/L (ref 0.5–1.9)
Lactic Acid, Venous: 4.4 mmol/L (ref 0.5–1.9)

## 2021-01-23 LAB — PROTIME-INR
INR: 1.6 — ABNORMAL HIGH (ref 0.8–1.2)
Prothrombin Time: 18.6 seconds — ABNORMAL HIGH (ref 11.4–15.2)

## 2021-01-23 IMAGING — DX DG CHEST 1V PORT
1 series · 1 of 1 positions shown · non-contrast
Comparison: Prior chest radiographs [DATE] and earlier.

CLINICAL DATA: Chest pain.

EXAM:
PORTABLE CHEST 1 VIEW

[chest ap]
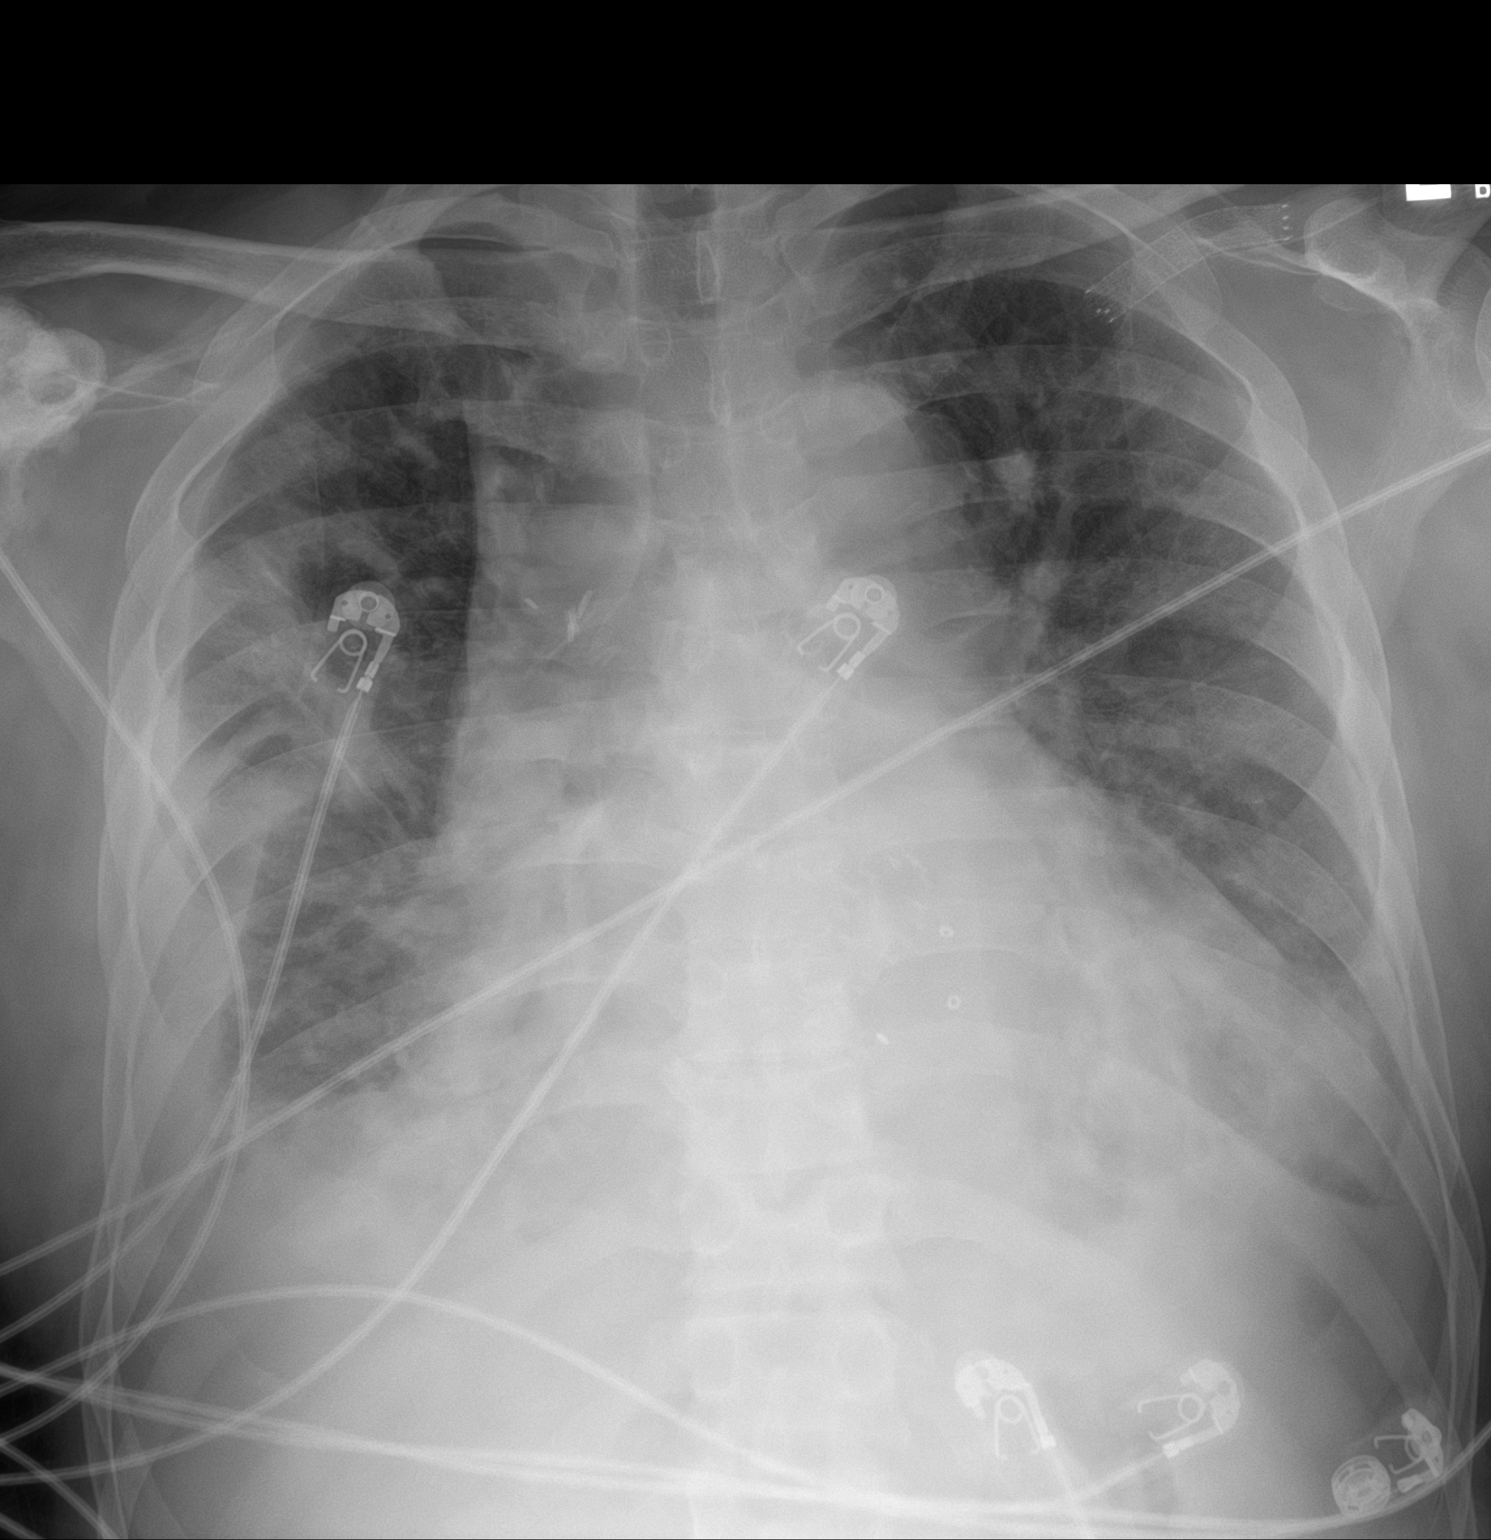

[1 of 1 positions shown; findings below may reference images not displayed]

FINDINGS: Cardiomegaly. Prior cardiac valve replacement. Central pulmonary
vascular congestion. Patchy bilateral airspace opacities.
Redemonstrated architectural distortion within the right mid lung.
Right greater than left pleural effusions, increased from the prior
chest radiograph of [DATE]. The right pleural effusion may be
loculated. No evidence of pneumothorax. No acute bony abnormality
identified. A vascular stent projects in the left subclavicular
region.
IMPRESSION: Cardiomegaly with pulmonary vascular congestion. Patchy bilateral
pulmonary opacities have progressed as compared to the chest
radiograph of [DATE] and are favored to reflect pulmonary edema.
Pneumonia considered less likely, although not excluded.

Progressive right greater than left pleural effusions. The right
pleural effusion may be loculated.

Redemonstrated architectural distortion within the right mid lung.

## 2021-01-23 IMAGING — CT CT ANGIO CHEST
3 of 7 series · 18 of 36 positions shown · IV contrast (omnipaque)
Comparison: CT chest [DATE].

CLINICAL DATA: Patient with chest pain.

EXAM:
CT ANGIOGRAPHY CHEST WITH CONTRAST
TECHNIQUE: Multidetector CT imaging of the chest was performed using the
standard protocol during bolus administration of intravenous
contrast. Multiplanar CT image reconstructions and MIPs were
obtained to evaluate the vascular anatomy.
CONTRAST:  75mL OMNIPAQUE IOHEXOL 350 MG/ML SOLN

[Series 6: pe lung · axial · 0.69mm/px · z∈[-731,-667]mm · 2 of 128 slices shown]
[im 32/128  mediastinal]
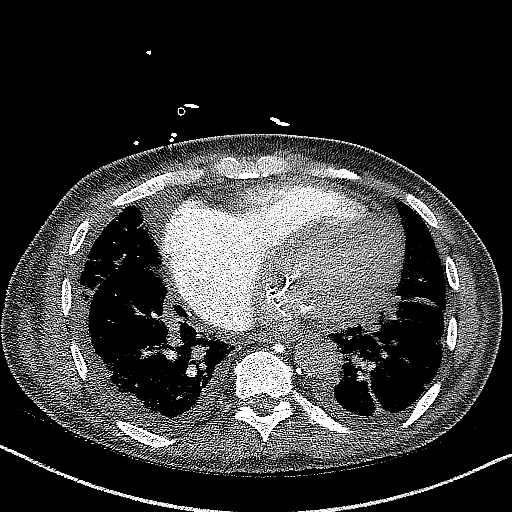
[im 64/128  mediastinal]
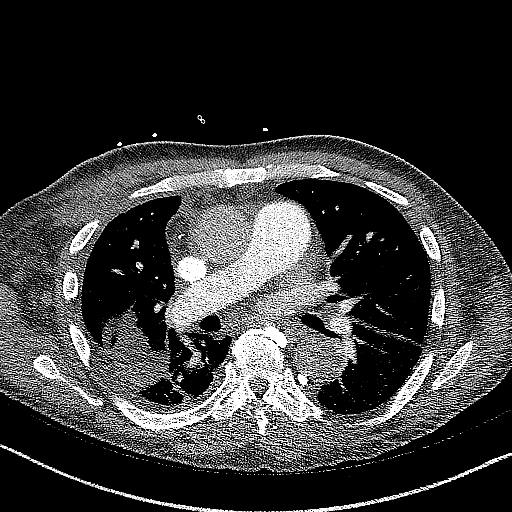

[Series 7: pe thins · axial · 0.69mm/px · z∈[-824,-558]mm · 15 of 436 slices shown]
[im 28/436  lung]
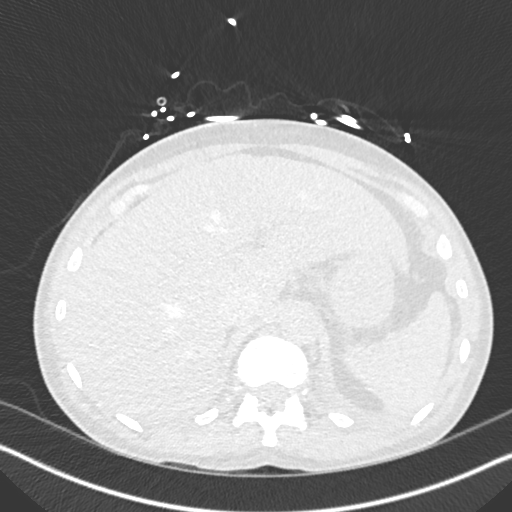
[im 55/436  mediastinal]
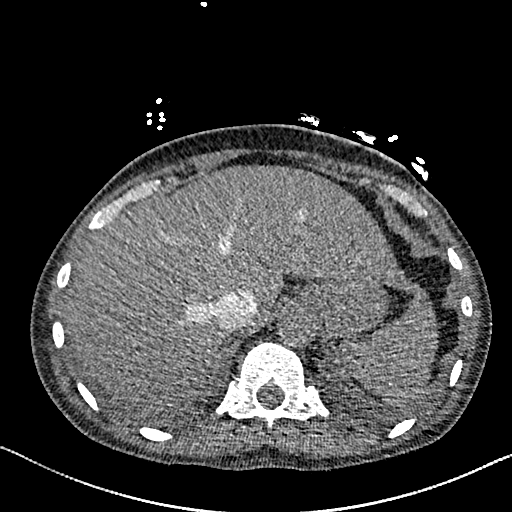
[im 82/436  lung]
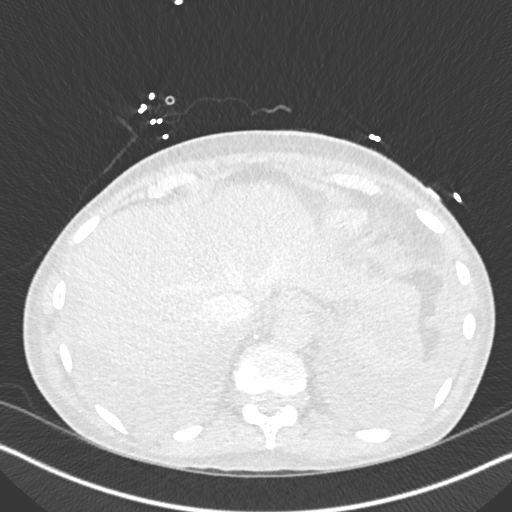
[im 109/436  mediastinal]
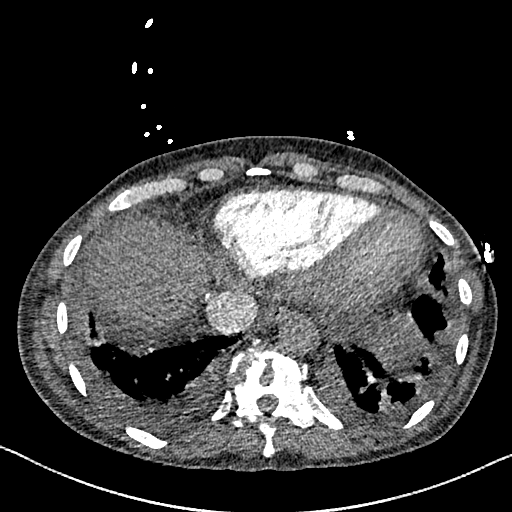
[im 136/436  lung]
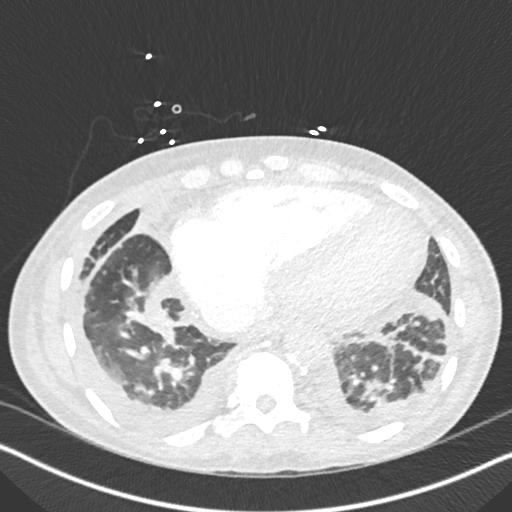
[im 164/436  mediastinal]
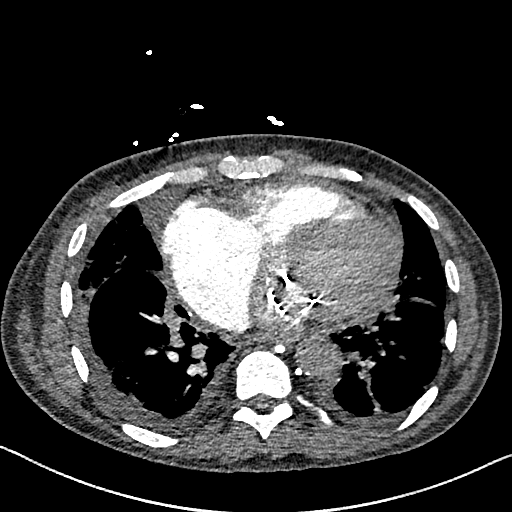
[im 191/436  lung]
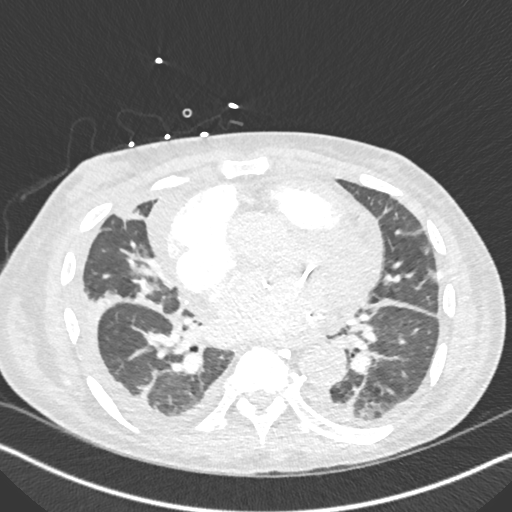
[im 218/436  mediastinal]
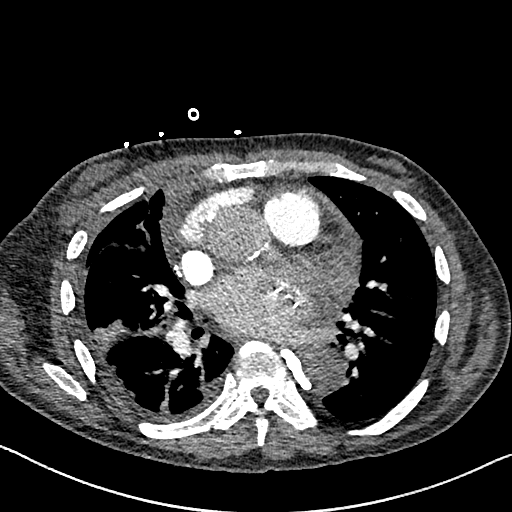
[im 245/436  lung]
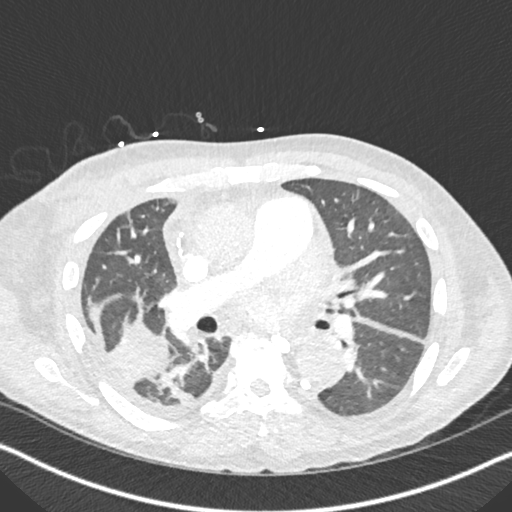
[im 272/436  mediastinal]
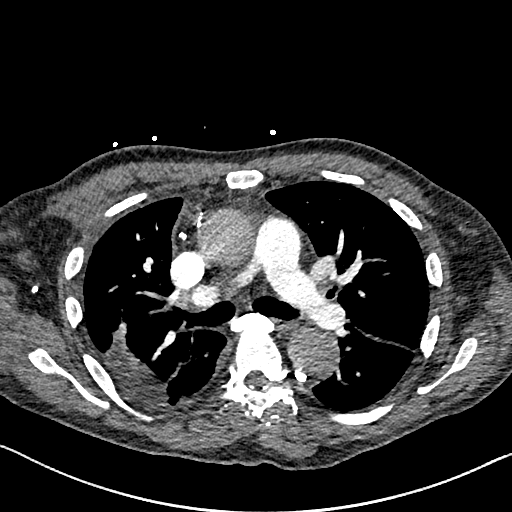
[im 300/436  lung]
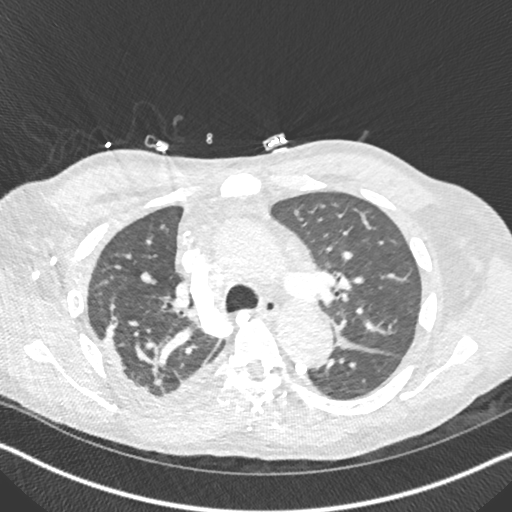
[im 327/436  mediastinal]
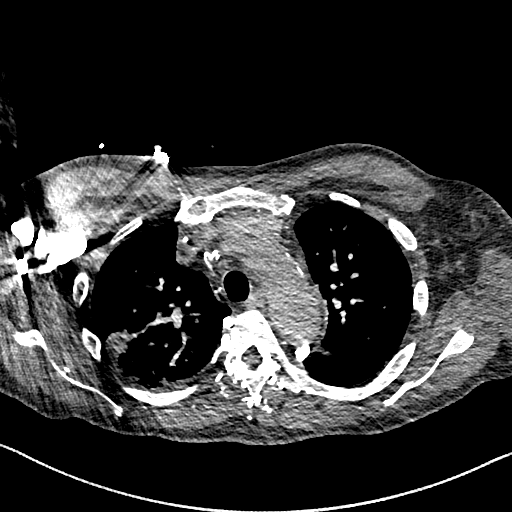
[im 354/436  lung]
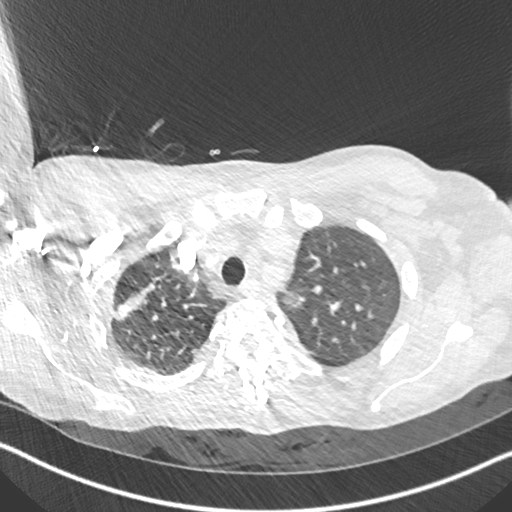
[im 381/436  mediastinal]
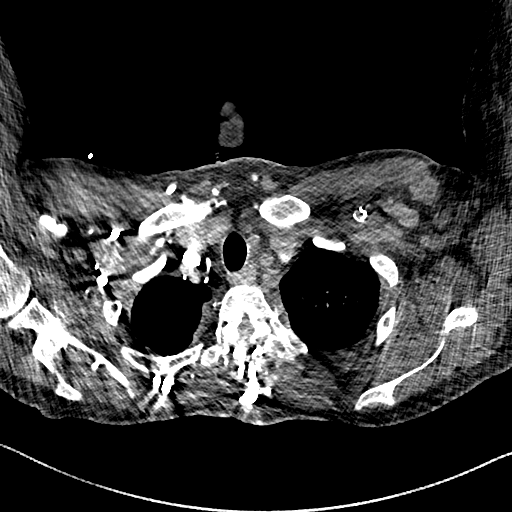
[im 408/436  lung]
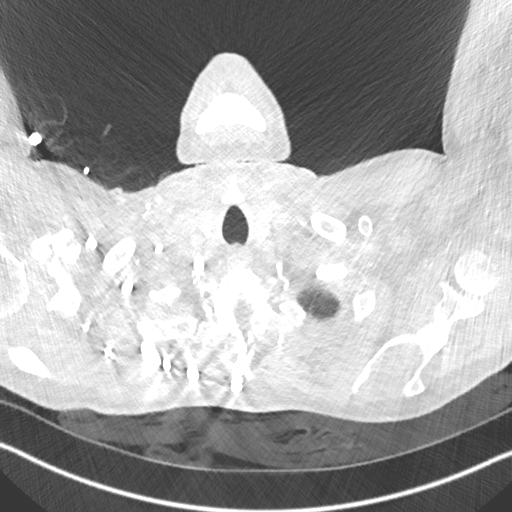

[Series 8: pe 2mm cor · coronal · 0.60mm/px · 1 of 120 slices shown]
[im 60/120  mediastinal]
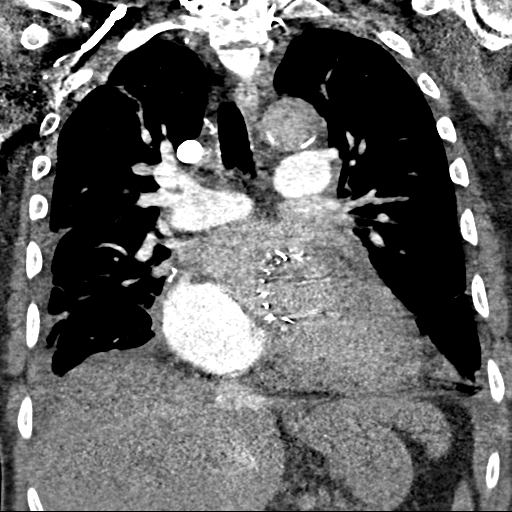

[18 of 36 positions shown; findings below may reference images not displayed]

FINDINGS: Cardiovascular: Heart is enlarged, particularly right heart. There
is reflux of contrast into the hepatic veins. Small pericardial
effusion. Thoracic aortic vascular calcifications. Adequate
opacification of the pulmonary arteries. Motion artifact limits
evaluation of the distal pulmonary arteries. Within the above
limitation, no evidence for acute pulmonary embolus.

Mediastinum/Nodes: No enlarged axillary, mediastinal or hilar
lymphadenopathy. Normal appearance of the esophagus.

Lungs/Pleura: Central airways are patent. There are small bilateral
pleural effusions. The right pleural effusion appears partially
loculated. Bandlike patchy ground-glass and consolidative opacities
throughout the lungs bilaterally.

Upper Abdomen: Unremarkable.

Musculoskeletal: Thoracic spine degenerative changes. There is
osseous destruction of the superior endplate of the T11 vertebral
body. There is loss of disc space height at the T10-T11 level. This
is most compatible with discitis osteomyelitis is demonstrated on
prior thoracic spine MRI [DATE].

Review of the MIP images confirms the above findings.
IMPRESSION: 1. No evidence for acute pulmonary embolus.
2. Bilateral pleural effusions. The right pleural effusion appears
to be partially loculated. There are scattered bandlike
consolidative opacities which may represent atelectasis and or
infection.
3. T10-T11 discitis osteomyelitis as demonstrated on prior MRI of
the thoracic spine [DATE].
4. The right heart is enlarged. Secondary findings suggestive of
right heart failure including reflux of contrast into the hepatic
veins.
5. Aortic Atherosclerosis ([XJ]-[XJ]).

## 2021-01-23 MED ORDER — LACTATED RINGERS IV BOLUS (SEPSIS): 1000.0000 mL | Freq: Once | INTRAVENOUS | Status: DC

## 2021-01-23 MED ORDER — CINACALCET HCL 30 MG PO TABS
60.0000 mg | ORAL_TABLET | Freq: Every day | ORAL | Status: DC
  Administered 2021-01-24 – 2021-01-29 (×6): 60 mg via ORAL
  Filled 2021-01-23 (×7): qty 2

## 2021-01-23 MED ORDER — CALCIUM CARBONATE ANTACID 1250 MG/5ML PO SUSP
500.0000 mg | Freq: Four times a day (QID) | ORAL | Status: DC | PRN
  Filled 2021-01-23: qty 5

## 2021-01-23 MED ORDER — PREGABALIN 75 MG PO CAPS
75.0000 mg | ORAL_CAPSULE | Freq: Every day | ORAL | Status: DC
  Administered 2021-01-24 – 2021-01-28 (×6): 75 mg via ORAL
  Filled 2021-01-23 (×2): qty 1
  Filled 2021-01-23: qty 3
  Filled 2021-01-23 (×3): qty 1

## 2021-01-23 MED ORDER — DOCUSATE SODIUM 283 MG RE ENEM
1.0000 | ENEMA | RECTAL | Status: DC | PRN
  Filled 2021-01-23: qty 1

## 2021-01-23 MED ORDER — HYDROXYZINE HCL 25 MG PO TABS: 25.0000 mg | ORAL_TABLET | Freq: Three times a day (TID) | ORAL | Status: DC | PRN

## 2021-01-23 MED ORDER — LACTATED RINGERS IV SOLN: INTRAVENOUS | Status: DC

## 2021-01-23 MED ORDER — SODIUM CHLORIDE 0.9 % IV SOLN
2.0000 g | Freq: Once | INTRAVENOUS | Status: AC
  Administered 2021-01-23: 2 g via INTRAVENOUS
  Filled 2021-01-23: qty 2

## 2021-01-23 MED ORDER — METHOCARBAMOL 500 MG PO TABS
500.0000 mg | ORAL_TABLET | Freq: Three times a day (TID) | ORAL | Status: DC
  Administered 2021-01-23 – 2021-01-29 (×17): 500 mg via ORAL
  Filled 2021-01-23 (×17): qty 1

## 2021-01-23 MED ORDER — DARBEPOETIN ALFA 200 MCG/0.4ML IJ SOSY
200.0000 ug | PREFILLED_SYRINGE | Freq: Once | INTRAMUSCULAR | Status: DC
  Filled 2021-01-23: qty 0.4

## 2021-01-23 MED ORDER — NEPRO/CARBSTEADY PO LIQD
237.0000 mL | Freq: Three times a day (TID) | ORAL | Status: DC | PRN
  Filled 2021-01-23: qty 237

## 2021-01-23 MED ORDER — POLYETHYLENE GLYCOL 3350 17 G PO PACK: 17.0000 g | PACK | Freq: Every day | ORAL | Status: DC | PRN

## 2021-01-23 MED ORDER — SORBITOL 70 % SOLN
30.0000 mL | Status: DC | PRN
  Filled 2021-01-23: qty 30

## 2021-01-23 MED ORDER — LACTATED RINGERS IV BOLUS (SEPSIS)
250.0000 mL | Freq: Once | INTRAVENOUS | Status: AC
  Administered 2021-01-23: 250 mL via INTRAVENOUS

## 2021-01-23 MED ORDER — SODIUM CHLORIDE 0.9 % IV SOLN: 100.0000 mL | INTRAVENOUS | Status: DC | PRN

## 2021-01-23 MED ORDER — LIDOCAINE-PRILOCAINE 2.5-2.5 % EX CREA: 1.0000 "application " | TOPICAL_CREAM | CUTANEOUS | Status: DC | PRN

## 2021-01-23 MED ORDER — ONDANSETRON HCL 4 MG PO TABS: 4.0000 mg | ORAL_TABLET | Freq: Four times a day (QID) | ORAL | Status: DC | PRN

## 2021-01-23 MED ORDER — LIDOCAINE HCL (PF) 1 % IJ SOLN: 5.0000 mL | INTRAMUSCULAR | Status: DC | PRN

## 2021-01-23 MED ORDER — METOPROLOL TARTRATE 25 MG PO TABS
25.0000 mg | ORAL_TABLET | Freq: Two times a day (BID) | ORAL | Status: DC
  Administered 2021-01-24 – 2021-01-29 (×11): 25 mg via ORAL
  Filled 2021-01-23 (×12): qty 1

## 2021-01-23 MED ORDER — CAMPHOR-MENTHOL 0.5-0.5 % EX LOTN
1.0000 "application " | TOPICAL_LOTION | Freq: Three times a day (TID) | CUTANEOUS | Status: DC | PRN
  Filled 2021-01-23: qty 222

## 2021-01-23 MED ORDER — VANCOMYCIN HCL 750 MG/150ML IV SOLN: 750.0000 mg | Freq: Once | INTRAVENOUS | Status: DC

## 2021-01-23 MED ORDER — SODIUM CHLORIDE 0.9% FLUSH
3.0000 mL | Freq: Two times a day (BID) | INTRAVENOUS | Status: DC
  Administered 2021-01-23 – 2021-01-27 (×8): 3 mL via INTRAVENOUS

## 2021-01-23 MED ORDER — DOCUSATE SODIUM 100 MG PO CAPS
100.0000 mg | ORAL_CAPSULE | Freq: Two times a day (BID) | ORAL | Status: DC
  Administered 2021-01-23 – 2021-01-28 (×6): 100 mg via ORAL
  Filled 2021-01-23 (×9): qty 1

## 2021-01-23 MED ORDER — RIFAMPIN 300 MG PO CAPS
300.0000 mg | ORAL_CAPSULE | Freq: Three times a day (TID) | ORAL | Status: DC
  Administered 2021-01-23 – 2021-01-25 (×9): 300 mg via ORAL
  Filled 2021-01-23 (×14): qty 1

## 2021-01-23 MED ORDER — ACETAMINOPHEN 650 MG RE SUPP: 650.0000 mg | Freq: Four times a day (QID) | RECTAL | Status: DC | PRN

## 2021-01-23 MED ORDER — DILTIAZEM HCL ER COATED BEADS 120 MG PO CP24
120.0000 mg | ORAL_CAPSULE | Freq: Every day | ORAL | Status: DC
  Administered 2021-01-24 – 2021-01-29 (×5): 120 mg via ORAL
  Filled 2021-01-23 (×6): qty 1

## 2021-01-23 MED ORDER — IOHEXOL 350 MG/ML SOLN
75.0000 mL | Freq: Once | INTRAVENOUS | Status: AC | PRN
  Administered 2021-01-23: 75 mL via INTRAVENOUS

## 2021-01-23 MED ORDER — DARBEPOETIN ALFA 200 MCG/0.4ML IJ SOSY
200.0000 ug | PREFILLED_SYRINGE | Freq: Once | INTRAMUSCULAR | Status: AC
  Administered 2021-01-23: 200 ug via SUBCUTANEOUS
  Filled 2021-01-23: qty 0.4

## 2021-01-23 MED ORDER — PANTOPRAZOLE SODIUM 40 MG PO TBEC
40.0000 mg | DELAYED_RELEASE_TABLET | Freq: Every day | ORAL | Status: DC
  Administered 2021-01-23 – 2021-01-29 (×7): 40 mg via ORAL
  Filled 2021-01-23 (×7): qty 1

## 2021-01-23 MED ORDER — VANCOMYCIN HCL 750 MG/150ML IV SOLN
750.0000 mg | INTRAVENOUS | Status: DC
  Filled 2021-01-23: qty 150

## 2021-01-23 MED ORDER — HEPARIN SODIUM (PORCINE) 1000 UNIT/ML DIALYSIS: 1000.0000 [IU] | INTRAMUSCULAR | Status: DC | PRN

## 2021-01-23 MED ORDER — CHLORHEXIDINE GLUCONATE CLOTH 2 % EX PADS
6.0000 | MEDICATED_PAD | Freq: Every day | CUTANEOUS | Status: DC
  Administered 2021-01-24 – 2021-01-25 (×2): 6 via TOPICAL

## 2021-01-23 MED ORDER — OXYCODONE HCL ER 15 MG PO T12A
15.0000 mg | EXTENDED_RELEASE_TABLET | Freq: Two times a day (BID) | ORAL | Status: DC
Start: 2021-01-23 — End: 2021-01-29
  Administered 2021-01-23 – 2021-01-29 (×12): 15 mg via ORAL
  Filled 2021-01-23 (×12): qty 1

## 2021-01-23 MED ORDER — ASPIRIN EC 325 MG PO TBEC
325.0000 mg | DELAYED_RELEASE_TABLET | Freq: Every day | ORAL | Status: DC
  Administered 2021-01-23 – 2021-01-29 (×7): 325 mg via ORAL
  Filled 2021-01-23 (×7): qty 1

## 2021-01-23 MED ORDER — PENTAFLUOROPROP-TETRAFLUOROETH EX AERO: 1.0000 "application " | INHALATION_SPRAY | CUTANEOUS | Status: DC | PRN

## 2021-01-23 MED ORDER — SENNOSIDES-DOCUSATE SODIUM 8.6-50 MG PO TABS
1.0000 | ORAL_TABLET | Freq: Two times a day (BID) | ORAL | Status: DC
  Administered 2021-01-23 – 2021-01-28 (×10): 1 via ORAL
  Filled 2021-01-23 (×11): qty 1

## 2021-01-23 MED ORDER — HEPARIN SODIUM (PORCINE) 5000 UNIT/ML IJ SOLN
5000.0000 [IU] | Freq: Three times a day (TID) | INTRAMUSCULAR | Status: DC
  Administered 2021-01-23 – 2021-01-27 (×12): 5000 [IU] via SUBCUTANEOUS
  Filled 2021-01-23 (×14): qty 1

## 2021-01-23 MED ORDER — SEVELAMER CARBONATE 800 MG PO TABS
1600.0000 mg | ORAL_TABLET | Freq: Three times a day (TID) | ORAL | Status: DC
  Administered 2021-01-23 – 2021-01-29 (×15): 1600 mg via ORAL
  Filled 2021-01-23 (×15): qty 2

## 2021-01-23 MED ORDER — ALTEPLASE 2 MG IJ SOLR: 2.0000 mg | Freq: Once | INTRAMUSCULAR | Status: DC | PRN

## 2021-01-23 MED ORDER — ACETAMINOPHEN 325 MG PO TABS: 650.0000 mg | ORAL_TABLET | Freq: Four times a day (QID) | ORAL | Status: DC | PRN

## 2021-01-23 MED ORDER — ZOLPIDEM TARTRATE 5 MG PO TABS: 5.0000 mg | ORAL_TABLET | Freq: Every evening | ORAL | Status: DC | PRN

## 2021-01-23 MED ORDER — BISACODYL 10 MG RE SUPP: 10.0000 mg | Freq: Every day | RECTAL | Status: DC | PRN

## 2021-01-23 MED ORDER — ONDANSETRON HCL 4 MG/2ML IJ SOLN: 4.0000 mg | Freq: Four times a day (QID) | INTRAMUSCULAR | Status: DC | PRN

## 2021-01-23 NOTE — Sepsis Progress Note (Signed)
Sepsis protocol is being followed by [facility]. 

## 2021-01-23 NOTE — Consult Note (Addendum)
Cardiology Consultation:   Patient ID: Patrick Anthony MRN: [id number]; DOB: [birth year]  Admit date: 01/23/2021 Date of Consult: 01/23/2021  PCP:  [name], MD   [facility] Providers Cardiologist:  Fransico Him, MD   Patient Profile:   Patrick Anthony is a 50 y.o. 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 Lorin Mercy.  History of Present Illness:   Patrick Anthony was admitted in March 2022 with endocarditis and underwent mitral valve replacement.  He also had right AKA due to osteomyelitis of the right leg.  His hospitalization was extensive found to have normal coronaries.  He was readmitted on 12/22/2020 with fevers and recurrent bacteremia.  Cultures were again positive for MRSA.  TEE on 12/24/2020 found to have mitral valve endocarditis.  CT was consulted.  On presentation he also had tachycardia this is suspicious for SVT versus atypical atrial flutter.  He did not break with adenosine administration and was therefore started on Cardizem drip.  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  He was felt to be high risk for hemorrhagic conversion.  Heart rate did reduce and it appeared as though he was in sinus tachycardia with no clear flutter waves.  CT surgery did not feel he was a good candidate for redo mitral valve surgery.  He was discharged on Cardizem 120 mg daily and antibiotics per ID.  He was twice daily EMS from [place] due to burning chest pain that started at 0630 today.  He has been compliant on dialysis.  He reported being awake since 3 AM this morning with gradual onset of left-sided chest discomfort.  He has received nitro 3 with resolution of his chest  pain.  He assumed this was related to his acid reflux.  Cardiology was consulted to evaluate chest pain.  Lactic acid 4.8 HS troponin 1110 --> 1159 Hemoglobin 9.4 W BC 17.5 sCR 6.32 CO2 22 Blood and urine cultures pending EKG with sinus tachycardia, left atrial enlargement  During my interview, he reports chest pain this morning that felt like burning food in his left chest.  He was concerned and wished to be brought in because the chest pain persisted longer than normal.  There was no radiation and no associated symptoms with the chest pain.  He remembers receiving 3 nitroglycerin tablets under his tongue without improvement in his symptoms.  He states that 324 mg aspirin administered by EMS finally resolved his chest pain.  He denies recent illness and new symptoms.  He denies chest pain with position changes or deep inspiration.  He denies shortness of breath and lower extremity swelling on the left.  He states he gets similar chest pain just prior to dialysis or when he "moves fast" when he is getting ready to go to dialysis.  He is fairly sedentary given his right AKA and uses a wheelchair.  He denies recent fevers, but does sweat routinely when he sleeps.   Past Medical History:  Diagnosis Date   Bacterial endocarditis 09/27/2020   MRSA   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 09/29/2020   Patient needs f/u CT chest 3 months   Chronic kidney disease    patient on dialysis tues-thurs-sat   Cirrhosis of liver ([place])  Drug addiction ([place])    ETOH abuse    Hepatitis    Hypertension    Mitral regurgitation    Patent foramen ovale 10/23/2019   Renal disorder renal failure   S/P minimally-invasive mitral valve replacement with bioprosthetic valve 10/14/2020   31 mm Medtronic Mosaic stented porcine bioprosthetic tissue valve via right mini thoracotomy approach   Sleep apnea    Systolic and diastolic CHF, 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[place]) 04/19/2013    Past Surgical History:  Procedure  Laterality Date   AMPUTATION Right 10/29/2020   Procedure: RIGHT ABOVE KNEE AMPUTATION;  Surgeon: [name], MD;  Location: [place];  Service: Orthopedics;  Laterality: Right;   AV FISTULA PLACEMENT Right    AV FISTULA PLACEMENT Left 09/03/2013   Procedure: ARTERIOVENOUS (AV) FISTULA CREATION - LEFT BRACHIOCEPHALIC;  Surgeon: Conrad [place], MD;  Location: [place];  Service: Vascular;  Laterality: Left;   AV FISTULA PLACEMENT Left 10/30/2019   Procedure: INSERTION OF ARTERIOVENOUS (AV) GORE-TEX GRAFT ARM;  Surgeon: Rosetta Posner, MD;  Location: [place];  Service: Vascular;  Laterality: Left;   BUBBLE STUDY  10/05/2020   Procedure: BUBBLE STUDY;  Surgeon: Pixie Casino, MD;  Location: [place];  Service: Cardiovascular;;   I & D EXTREMITY Right 10/20/2020   Procedure: IRRIGATION AND DEBRIDEMENT OF LEG;  Surgeon: [name], MD;  Location: [place];  Service: Orthopedics;  Laterality: Right;   INSERTION OF DIALYSIS CATHETER N/A 08/29/2013   Procedure: INSERTION OF DIALYSIS CATHETER;  Surgeon: Mal Misty, MD;  Location: [place];  Service: Vascular;  Laterality: N/A;   INSERTION OF DIALYSIS CATHETER Left 10/30/2019   Procedure: INSERTION OF DIALYSIS CATHETER LEFT INTERNAL JUGULAR;  Surgeon: Rosetta Posner, MD;  Location: [place];  Service: Vascular;  Laterality: Left;   LEFT HEART CATH AND CORONARY ANGIOGRAPHY N/A 10/07/2020   Procedure: LEFT HEART CATH AND CORONARY ANGIOGRAPHY;  Surgeon: Lorretta Harp, MD;  Location: [place] [facility];  Service: Cardiovascular;  Laterality: N/A;   MITRAL VALVE REPAIR Right 10/14/2020   Procedure: MINIMALLY INVASIVE MITRAL VALVE  REPLACEMENT(MVR) USING MEDTRONIC MOSAIC VALVE SIZE 31MM;  Surgeon: Rexene Alberts, MD;  Location: [place];  Service: Open Heart Surgery;  Laterality: Right;   MULTIPLE EXTRACTIONS WITH ALVEOLOPLASTY N/A 10/12/2020   Procedure: MULTIPLE EXTRACTION WITH ALVEOLOPLASTY;  Surgeon: [name], DMD;  Location: [place];  Service: Dentistry;   Laterality: N/A;   PERIPHERAL VASCULAR CATHETERIZATION N/A 01/06/2015   Procedure: A/V Shuntogram/Fistulagram;  Surgeon: Katha Cabal, MD;  Location: [place] [facility];  Service: Cardiovascular;  Laterality: N/A;   PERIPHERAL VASCULAR CATHETERIZATION Left 01/06/2015   Procedure: A/V Shunt Intervention;  Surgeon: Katha Cabal, MD;  Location: [place] [facility];  Service: Cardiovascular;  Laterality: Left;   REPAIR OF PATENT FORAMEN OVALE N/A 10/14/2020   Procedure: CLOSURE OF PATENT FORAMEN OVALE;  Surgeon: Rexene Alberts, MD;  Location: [place];  Service: Open Heart Surgery;  Laterality: N/A;   TEE WITHOUT CARDIOVERSION N/A 10/05/2020   Procedure: TRANSESOPHAGEAL ECHOCARDIOGRAM (TEE);  Surgeon: Pixie Casino, MD;  Location: [place];  Service: Cardiovascular;  Laterality: N/A;   TEE WITHOUT CARDIOVERSION N/A 10/14/2020   Procedure: TRANSESOPHAGEAL ECHOCARDIOGRAM (TEE);  Surgeon: Rexene Alberts, MD;  Location: [place];  Service: Open Heart Surgery;  Laterality: N/A;   TEE WITHOUT CARDIOVERSION N/A 12/24/2020   Procedure: TRANSESOPHAGEAL ECHOCARDIOGRAM (TEE);  Surgeon: Pixie Casino, MD;  Location: [place];  Service:  Cardiovascular;  Laterality: N/A;     Home Medications:  Prior to Admission medications   Medication Sig Start Date End Date Taking? Authorizing Provider  acetaminophen (TYLENOL) 325 MG tablet Take 650 mg by mouth every 6 (six) hours as needed for mild pain. Temp > 100.5    [provider]  aspirin EC 325 MG EC tablet Take 1 tablet (325 mg total) by mouth daily. 11/10/20   Samella Parr, NP  bisacodyl (DULCOLAX) 10 MG suppository Place 1 suppository (10 mg total) rectally daily as needed for moderate constipation. 11/09/20   Samella Parr, NP  cinacalcet (SENSIPAR) 30 MG tablet Take 2 tablets (60 mg total) by mouth daily with breakfast. 11/10/20   Samella Parr, NP  Darbepoetin Alfa (ARANESP, ALBUMIN FREE, IJ) Darbepoetin Alfa (Aranesp) 07/05/20  07/04/21  [provider]  diltiazem (CARDIZEM CD) 120 MG 24 hr capsule Take 1 capsule (120 mg total) by mouth daily. 01/07/21   Ghimire, Henreitta Leber, MD  docusate sodium (COLACE) 100 MG capsule Take 1 capsule (100 mg total) by mouth 2 (two) times daily. 11/09/20   Samella Parr, NP  methocarbamol (ROBAXIN) 500 MG tablet Take 1 tablet (500 mg total) by mouth 3 (three) times daily. 11/09/20   Samella Parr, NP  metoprolol tartrate (LOPRESSOR) 50 MG tablet Take 0.5 tablets (25 mg total) by mouth 2 (two) times daily. 01/06/21   Ghimire, Henreitta Leber, MD  Multiple Vitamin (MULTIVITAMIN WITH MINERALS) TABS tablet Take 1 tablet by mouth daily.    [provider]  nitroGLYCERIN (NITROSTAT) 0.4 MG SL tablet Place 0.4 mg under the tongue every 5 (five) minutes x 3 doses as needed for chest pain.     [provider]  Nutritional Supplements (,FEEDING SUPPLEMENT, PROSOURCE PLUS) liquid Take 30 mLs by mouth 2 (two) times daily between meals. 11/09/20   Samella Parr, NP  Nutritional Supplements (FEEDING SUPPLEMENT, NEPRO CARB STEADY,) LIQD Take 237 mLs by mouth 3 (three) times daily between meals. 11/09/20   Samella Parr, NP  oxyCODONE (OXYCONTIN) 15 mg 12 hr tablet Take 1 tablet (15 mg total) by mouth every 12 (twelve) hours. 01/06/21   Ghimire, Henreitta Leber, MD  pantoprazole (PROTONIX) 40 MG tablet Take 1 tablet (40 mg total) by mouth daily. 11/10/20   Samella Parr, NP  polyethylene glycol (MIRALAX / GLYCOLAX) 17 g packet Take 17 g by mouth daily as needed for mild constipation. 11/09/20   Samella Parr, NP  pregabalin (LYRICA) 75 MG capsule Take 1 capsule (75 mg total) by mouth at bedtime. 11/09/20   Samella Parr, NP  rifampin (RIFADIN) 300 MG capsule Take 1 capsule (300 mg total) by mouth every 8 (eight) hours. 6 weeks from 5/27 01/06/21   Jonetta Osgood, MD  senna-docusate (SENOKOT-S) 8.6-50 MG tablet Take 1 tablet by mouth 2 (two) times daily. 11/09/20   Samella Parr, NP   sevelamer carbonate (RENVELA) 800 MG tablet Take 1,600 mg by mouth 3 (three) times daily with meals.    [provider]  Vancomycin (VANCOCIN) 750-5 MG/150ML-% SOLN Inject 150 mLs (750 mg total) into the vein every Monday, Wednesday, and Friday with hemodialysis. 6 weeks of vancomycin from 5/27 12/31/20   Jonetta Osgood, MD    Inpatient Medications: Scheduled Meds:  Continuous Infusions:  lactated ringers 150 mL/hr at 01/23/21 1209   [START ON 01/24/2021] vancomycin     PRN Meds:   Allergies:   No Known Allergies  Social History:   Social History   Socioeconomic History   Marital status: Single    Spouse name: Not on file   Number of children: Not on file   Years of education: Not on file   Highest education level: Not on file  Occupational History   Not on file  Tobacco Use   Smoking status: Former    Packs/day: 0.20    Pack years: 0.00    Types: Cigarettes    Quit date: 10/05/2017    Years since quitting: 3.3   Smokeless tobacco: Never   Tobacco comments:    Quit 3 years ago  Vaping Use   Vaping Use: Never used  Substance and Sexual Activity   Alcohol use: No   Drug use: No    Comment: Quit   Sexual activity: Not Currently    Birth control/protection: Condom  Other Topics Concern   Not on file  Social History Narrative   ** Merged History Encounter **       Social Determinants of Health   Financial Resource Strain: Not on file  Food Insecurity: Not on file  Transportation Needs: Not on file  Physical Activity: Not on file  Stress: Not on file  Social Connections: Not on file  Intimate Partner Violence: Not on file    Family History:    Family History  Problem Relation Age of Onset   Hypertension Mother    Cancer Mother        breast     ROS:  Please see the history of present illness.   All other ROS reviewed and negative.     Physical Exam/Data:   Vitals:   01/23/21 1100 01/23/21 1115 01/23/21 1130 01/23/21 1215  BP: (!)  127/92 (!) 124/92 (!) 132/91 119/87  Pulse: (!) 102 (!) 104 (!) 102 (!) 103  Resp: (!) 38 (!) 29 (!) 28 (!) 24  Temp:      TempSrc:      SpO2: 95% 98% 98% 99%   No intake or output data in the 24 hours ending 01/23/21 1323 Last 3 Weights 01/21/2021 01/05/2021 01/04/2021  Weight (lbs) 149 lb 147 lb 0.8 oz 142 lb 6.7 oz  Weight (kg) 67.586 kg 66.7 kg 64.6 kg     There is no height or weight on file to calculate BMI.  General:  Well nourished, well developed, in no acute distress Neck: no JVD Endocrine:  No thryomegaly Vascular: No carotid bruits; FA pulses 2+ bilaterally without bruits  Cardiac:  normal S1, S2; regular rhythm, tachycardic rate, 3/6 m radiating to the axilla  Lungs:  clear to auscultation bilaterally, no wheezing, rhonchi or rales  Abd: soft, nontender, no hepatomegaly  Ext: no edema, right AKA Skin: warm and dry  Neuro:  CNs 2-12 intact, no focal abnormalities noted Psych:  Normal affect   EKG:  The EKG was personally reviewed and demonstrates: Sinus tachycardia, heart rate 109, left atrial enlargement Telemetry:  Telemetry was personally reviewed and demonstrates: Chest to sinus tachycardia with heart rates in the 90s to 110s  Relevant CV Studies:  TEE 12/24/2020: 1. Left ventricular ejection fraction, by estimation, is 60 to 65%. The  left ventricle has normal function. The left ventricle has no regional  wall motion abnormalities. There is moderate left ventricular hypertrophy.   2. Right ventricular systolic function is normal. The right ventricular  size is severely enlarged. Mildly increased right ventricular wall  thickness.   3. No left atrial/left atrial appendage thrombus  was detected.   4. Right atrial size was severely dilated.   5. The mitral valve has been repaired/replaced. Trivial mitral valve  regurgitation. There is a bioprosthetic valve present in the mitral  position. Echo findings are consistent with vegetation the mitral  prosthesis.   6.  Aortic valve vegetation is visualized on the left.   7. The aortic valve is tricuspid. Aortic valve regurgitation is not  visualized.   8. There is mild (Grade II) plaque.    Left heart cath 10/07/20: IMPRESSION: Mr. Gershon has clean coronary arteries, mildly elevated LVEDP and no evidence of a large V wave.  If he needs mitral valve replacement/repair his coronary anatomy is clean.  I did not place closure devices since the patient has potential SBE and sepsis I do not want to leave a foreign body in the patient's artery or vein.  The sheath will be removed and pressure held.  The patient left lab in stable condition.    Laboratory Data:  High Sensitivity Troponin:   Recent Labs  Lab 12/26/20 1019 12/26/20 1214 01/23/21 0920 01/23/21 1119  TROPONINIHS 613* 774* 1,110* 1,159*     Chemistry Recent Labs  Lab 01/23/21 0920  NA 133*  K 4.1  CL 91*  CO2 22  GLUCOSE 85  BUN 25*  CREATININE 6.32*  CALCIUM 8.1*  GFRNONAA 10*  ANIONGAP 20*    Recent Labs  Lab 01/23/21 0920  PROT 8.6*  ALBUMIN 2.3*  AST 86*  ALT 32  ALKPHOS 82  BILITOT 3.9*   Hematology Recent Labs  Lab 01/23/21 0920  WBC 17.5*  RBC 3.69*  HGB 9.4*  HCT 33.8*  MCV 91.6  MCH 25.5*  MCHC 27.8*  RDW 25.4*  PLT 259   BNPNo results for input(s): BNP, PROBNP in the last 168 hours.  DDimer No results for input(s): DDIMER in the last 168 hours.   Radiology/Studies:  CT Angio Chest PE W and/or Wo Contrast  Result Date: 01/23/2021 CLINICAL DATA:  Patient with chest pain. EXAM: CT ANGIOGRAPHY CHEST WITH CONTRAST TECHNIQUE: Multidetector CT imaging of the chest was performed using the standard protocol during bolus administration of intravenous contrast. Multiplanar CT image reconstructions and MIPs were obtained to evaluate the vascular anatomy. CONTRAST:  49m OMNIPAQUE IOHEXOL 350 MG/ML SOLN COMPARISON:  CT chest 09/29/2020. FINDINGS: Cardiovascular: Heart is enlarged, particularly right heart. There is  reflux of contrast into the hepatic veins. Small pericardial effusion. Thoracic aortic vascular calcifications. Adequate opacification of the pulmonary arteries. Motion artifact limits evaluation of the distal pulmonary arteries. Within the above limitation, no evidence for acute pulmonary embolus. Mediastinum/Nodes: No enlarged axillary, mediastinal or hilar lymphadenopathy. Normal appearance of the esophagus. Lungs/Pleura: Central airways are patent. There are small bilateral pleural effusions. The right pleural effusion appears partially loculated. Bandlike patchy ground-glass and consolidative opacities throughout the lungs bilaterally. Upper Abdomen: Unremarkable. Musculoskeletal: Thoracic spine degenerative changes. There is osseous destruction of the superior endplate of the [id number] body. There is loss of disc space height at the T10-T11 level. This is most compatible with discitis osteomyelitis is demonstrated on prior thoracic spine MRI 12/25/2020. Review of the MIP images confirms the above findings. IMPRESSION: 1. No evidence for acute pulmonary embolus. 2. Bilateral pleural effusions. The right pleural effusion appears to be partially loculated. There are scattered bandlike consolidative opacities which may represent atelectasis and or infection. 3. T10-T11 discitis osteomyelitis as demonstrated on prior MRI of the thoracic spine 12/25/2020. 4. The right heart is enlarged. Secondary  findings suggestive of right heart failure including reflux of contrast into the hepatic veins. 5. Aortic Atherosclerosis (ICD10-I70.0). Electronically Signed   By: Lovey Newcomer M.D.   On: 01/23/2021 12:45   DG Chest Portable 1 View  Result Date: 01/23/2021 CLINICAL DATA:  Chest pain. EXAM: PORTABLE CHEST 1 VIEW COMPARISON:  Prior chest radiographs 12/22/2020 and earlier. FINDINGS: Cardiomegaly. Prior cardiac valve replacement. Central pulmonary vascular congestion. Patchy bilateral airspace opacities. Redemonstrated  architectural distortion within the right mid lung. Right greater than left pleural effusions, increased from the prior chest radiograph of 12/22/2020. The right pleural effusion may be loculated. No evidence of pneumothorax. No acute bony abnormality identified. A vascular stent projects in the left subclavicular region. IMPRESSION: Cardiomegaly with pulmonary vascular congestion. Patchy bilateral pulmonary opacities have progressed as compared to the chest radiograph of 12/22/2020 and are favored to reflect pulmonary edema. Pneumonia considered less likely, although not excluded. Progressive right greater than left pleural effusions. The right pleural effusion may be loculated. Redemonstrated architectural distortion within the right mid lung. Electronically Signed   By: Kellie Simmering DO   On: 01/23/2021 10:26     Assessment and Plan:   Chest pain Troponin elevation - He had a reassuring heart cath 10/07/2020 that showed essentially normal coronaries - hs troponin has trended up to 1110 --> 1159 - initial troponin was 6 hrs after CP onset, pt had CP for at least 2 hrs - trend is essentially flat - EKG does not appear ischemic - He describes food burning in his left chest for 2 to 3 hours this morning, he admits to poor p.o. intake at the facility - Given lactic acid, leukocytosis, will defer further ischemic work-up at this time  - will trend another 2 troponins - question if this is demand in the setting of infection vs embolism to coronary, but would expect higher troponin elevation in the latter - unclear etiology of chest pain, but low suspicion for ACS - consider GI vs pain from endocarditis   Sinus tachycardia - Maintained on 120 mg Cardizem at home - Continue Cardizem here   Bioprosthetic mitral valve endocarditis -Blood cultures positive on 5/18 -TEE on 5/20 concerning for mitral valve bioprosthetic endocarditis -Repeat blood cultures in process - he completed 2 weeks of gentamicin,  is still on vanc and rifampin - end date 02/18/21 - he has been turned down for redo valve surgery by CT surgery - continue medical therapy of endocarditis - do not suspect he will need repeat TEE at this time   History of cardioembolic infarcts - Do not appreciate focal deficits on exam today   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       For questions or updates, please contact [place] Please consult [email or web address] for contact info under    Signed, Ledora Bottcher, PA  01/23/2021 1:23 PM  Chest pain-atypical  Severe RVE known-normal LV function echo 4/22  Elevated troponins-chronic-higher than previous  Bilateral effusions question loculated right greater than left-new  Endocarditis with prior mitral valve replacement and recurrent bioprosthetic endocarditis  Cardioembolic infarcts-CNS-question of the sources  Discitis/osteomyelitis  Patient has recurrent chest pain associated with getting himself  ready for dialysis, which he was doing accidentally again today.  What was notable about today was that it was longer and perhaps more intense.  It has been going on for months.  It is not clear to me whether it antedates or postdates his catheterization of 3/22.  With his multiple cardioembolic events, not withstanding his clean coronaries, he might be at risk for an acute cardiac syndrome; however, he would be unlikely to give rise to exertional ischemia over months.  I think his pain is probably not cardiac.  This is true not withstanding his troponin elevation>>they were quite high-700's and now 1100's   these are extremely high even for ESRD as best as I can tell from brief  literature search.  But information of delta troponin will be helpful  The pleural effusions are concerning esp the loculations>> suggestive of infection and heightened by the significantly elevated lactate  There are some data that pleural effusions cannot be associated with predominant right heart failure although typically they are associated with left heart failure.  His LV function has been normal although he might well have elevated left atrial pressure secondary to his mitral valve disease.  For now then, Will follow the troponin prior to making a recommendation regarding anticoagulation Check a BNP to help understand the pleural effusions Appreciate the input from infectious disease and the probable need for thoracentesis

## 2021-01-23 NOTE — Sepsis Progress Note (Signed)
Secure chat with provider regarding fluids for LA of 4.4. Provider does not feel like full amount of fluids are appropriate.

## 2021-01-23 NOTE — Consult Note (Signed)
[facility] Renal Consultation Note    Indication for Consultation:  Management of ESRD/hemodialysis, anemia, hypertension/volume, and secondary hyperparathyroidism. PCP:  HPI: Patrick Anthony is a 50 y.o. male with ESRD, HTN, HFrEF, and Hep C cirrhosis who has been admitted for sepsis.  Brought in by EMS this morning from SNF after waking from sleep with severe L chest pain episode lasting ~2 hours. Has also had some dyspnea. At first, he thought it was heartburn and took an antacid, then was given NTG/aspirin. No fever or chills, no N/V/D. In the ED, he was slightly hypoxic requiring nasal O2. Labs showed Na 133, K 4.1, Ca 8.1, WBC 17.5, Hgb 9.4, trop 1110 -> 1159, LA 4.8 -> 4.4. Flu/COVID negative. Sepsis protocol activated, Cx drawn and started on Vanc/Cefepime. Cardiology, ID, and nephrology consulted.  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 ID following antibiotics.  Cardiology consulted for ^ troponin, high but flat, felt likely demand ischemia.  Evaluated in ED bed today - looks acutely ill, but not in acute distress. No CP at the moment.  Dialyzes on MWF sched at [facility] - had full HD on Friday 6/17. Using L AVG as access which has remained clear of visible infection.   Past Medical History:  Diagnosis Date   Bacterial endocarditis 09/27/2020   MRSA   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 09/29/2020   Patient needs f/u CT chest 3 months   Chronic kidney disease    patient on dialysis tues-thurs-sat   Cirrhosis of liver ([place])    Drug addiction ([place])    ETOH abuse    Hepatitis    Hypertension    Mitral regurgitation    Patent foramen ovale 10/23/2019   Renal disorder renal failure   S/P  minimally-invasive mitral valve replacement with bioprosthetic valve 10/14/2020   31 mm Medtronic Mosaic stented porcine bioprosthetic tissue valve via right mini thoracotomy approach   Sleep apnea    Systolic and diastolic CHF, 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[place]) 04/19/2013   Past Surgical History:  Procedure Laterality Date   AMPUTATION Right 10/29/2020   Procedure: RIGHT ABOVE KNEE AMPUTATION;  Surgeon: [name], MD;  Location: [place];  Service: Orthopedics;  Laterality: Right;   AV FISTULA PLACEMENT Right    AV FISTULA PLACEMENT Left 09/03/2013   Procedure: ARTERIOVENOUS (AV) FISTULA CREATION - LEFT BRACHIOCEPHALIC;  Surgeon: Conrad Lofall, MD;  Location: [place];  Service: Vascular;  Laterality: Left;   AV FISTULA PLACEMENT Left 10/30/2019   Procedure: INSERTION OF ARTERIOVENOUS (AV) GORE-TEX GRAFT ARM;  Surgeon: Rosetta Posner, MD;  Location: [place];  Service: Vascular;  Laterality: Left;   BUBBLE STUDY  10/05/2020   Procedure: BUBBLE STUDY;  Surgeon: Pixie Casino, MD;  Location: [place];  Service: Cardiovascular;;   I & D EXTREMITY Right 10/20/2020   Procedure: IRRIGATION AND DEBRIDEMENT OF LEG;  Surgeon: [name], MD;  Location: [place];  Service: Orthopedics;  Laterality: Right;   INSERTION OF DIALYSIS CATHETER N/A 08/29/2013   Procedure: INSERTION OF DIALYSIS CATHETER;  Surgeon: Mal Misty, MD;  Location: [place];  Service: Vascular;  Laterality: N/A;   INSERTION OF DIALYSIS CATHETER Left 10/30/2019   Procedure: INSERTION OF DIALYSIS CATHETER LEFT INTERNAL JUGULAR;  Surgeon: Rosetta Posner, MD;  Location: [place];  Service: Vascular;  Laterality: Left;   LEFT HEART CATH AND CORONARY ANGIOGRAPHY N/A 10/07/2020   Procedure: LEFT HEART CATH AND CORONARY ANGIOGRAPHY;  Surgeon: Lorretta Harp, MD;  Location: [place] [facility];  Service: Cardiovascular;  Laterality: N/A;   MITRAL VALVE REPAIR Right 10/14/2020   Procedure: MINIMALLY INVASIVE MITRAL VALVE  REPLACEMENT(MVR) USING MEDTRONIC MOSAIC  VALVE SIZE 31MM;  Surgeon: Rexene Alberts, MD;  Location: [place];  Service: Open Heart Surgery;  Laterality: Right;   MULTIPLE EXTRACTIONS WITH ALVEOLOPLASTY N/A 10/12/2020   Procedure: MULTIPLE EXTRACTION WITH ALVEOLOPLASTY;  Surgeon: [name], DMD;  Location: [place];  Service: Dentistry;  Laterality: N/A;   PERIPHERAL VASCULAR CATHETERIZATION N/A 01/06/2015   Procedure: A/V Shuntogram/Fistulagram;  Surgeon: Katha Cabal, MD;  Location: [place] [facility];  Service: Cardiovascular;  Laterality: N/A;   PERIPHERAL VASCULAR CATHETERIZATION Left 01/06/2015   Procedure: A/V Shunt Intervention;  Surgeon: Katha Cabal, MD;  Location: [place] [facility];  Service: Cardiovascular;  Laterality: Left;   REPAIR OF PATENT FORAMEN OVALE N/A 10/14/2020   Procedure: CLOSURE OF PATENT FORAMEN OVALE;  Surgeon: Rexene Alberts, MD;  Location: [place];  Service: Open Heart Surgery;  Laterality: N/A;   TEE WITHOUT CARDIOVERSION N/A 10/05/2020   Procedure: TRANSESOPHAGEAL ECHOCARDIOGRAM (TEE);  Surgeon: Pixie Casino, MD;  Location: [place];  Service: Cardiovascular;  Laterality: N/A;   TEE WITHOUT CARDIOVERSION N/A 10/14/2020   Procedure: TRANSESOPHAGEAL ECHOCARDIOGRAM (TEE);  Surgeon: Rexene Alberts, MD;  Location: [place];  Service: Open Heart Surgery;  Laterality: N/A;   TEE WITHOUT CARDIOVERSION N/A 12/24/2020   Procedure: TRANSESOPHAGEAL ECHOCARDIOGRAM (TEE);  Surgeon: Pixie Casino, MD;  Location: [facility];  Service: Cardiovascular;  Laterality: N/A;   Family History  Problem Relation Age of Onset   Hypertension Mother    Cancer Mother        breast   Social History:  reports that he quit smoking about 3 years ago. His smoking use included cigarettes. He smoked an average of 0.20 packs per day. He has never used smokeless tobacco. He reports that he does not drink alcohol and does not use drugs.  ROS: As per HPI otherwise negative.  Physical Exam: Vitals:   01/23/21 1315  01/23/21 1330 01/23/21 1345 01/23/21 1400  BP: (!) 127/97 (!) 127/98 (!) 128/100 (!) 129/100  Pulse: 100 99 100   Resp: (!) 34 12 (!) 33 18  Temp:      TempSrc:      SpO2: 94% 99% 99%      General: Very pleasant, chronically ill appearing man, NAD. Nasal O2 in place. Head: Normocephalic, atraumatic, sclera non-icteric, mucus membranes are moist. Neck: Supple without lymphadenopathy/masses. JVD not elevated. Lungs: Bibasilar crackles, clear in upper lobes Heart: RRR with normal S1, S2. No murmurs, rubs, or gallops appreciated. Abdomen: Soft, non-tender, non-distended with normoactive bowel sounds. Musculoskeletal:  Strength and tone appear normal for age. Lower extremities: R BKA without stump edema, 1+ LLE edema Neuro: Alert and oriented X 3. Moves all extremities spontaneously. Psych:  Responds to questions appropriately with a normal affect. Dialysis Access: LUE AVG + bruit  No Known Allergies Prior to Admission medications   Medication Sig Start Date End Date Taking? Authorizing Provider  acetaminophen (TYLENOL) 325 MG tablet Take 650 mg by mouth every 6 (six) hours as needed for mild pain. Temp > 100.5    [provider]  aspirin EC 325 MG EC tablet Take 1 tablet (325  mg total) by mouth daily. 11/10/20   Samella Parr, NP  bisacodyl (DULCOLAX) 10 MG suppository Place 1 suppository (10 mg total) rectally daily as needed for moderate constipation. 11/09/20   Samella Parr, NP  cinacalcet (SENSIPAR) 30 MG tablet Take 2 tablets (60 mg total) by mouth daily with breakfast. 11/10/20   Samella Parr, NP  Darbepoetin Alfa (ARANESP, ALBUMIN FREE, IJ) Darbepoetin Alfa (Aranesp) 07/05/20 07/04/21  [provider]  diltiazem (CARDIZEM CD) 120 MG 24 hr capsule Take 1 capsule (120 mg total) by mouth daily. 01/07/21   Ghimire, Henreitta Leber, MD  docusate sodium (COLACE) 100 MG capsule Take 1 capsule (100 mg total) by mouth 2 (two) times daily. 11/09/20   Samella Parr, NP   methocarbamol (ROBAXIN) 500 MG tablet Take 1 tablet (500 mg total) by mouth 3 (three) times daily. 11/09/20   Samella Parr, NP  metoprolol tartrate (LOPRESSOR) 50 MG tablet Take 0.5 tablets (25 mg total) by mouth 2 (two) times daily. 01/06/21   Ghimire, Henreitta Leber, MD  Multiple Vitamin (MULTIVITAMIN WITH MINERALS) TABS tablet Take 1 tablet by mouth daily.    [provider]  nitroGLYCERIN (NITROSTAT) 0.4 MG SL tablet Place 0.4 mg under the tongue every 5 (five) minutes x 3 doses as needed for chest pain.     [provider]  Nutritional Supplements (,FEEDING SUPPLEMENT, PROSOURCE PLUS) liquid Take 30 mLs by mouth 2 (two) times daily between meals. 11/09/20   Samella Parr, NP  Nutritional Supplements (FEEDING SUPPLEMENT, NEPRO CARB STEADY,) LIQD Take 237 mLs by mouth 3 (three) times daily between meals. 11/09/20   Samella Parr, NP  oxyCODONE (OXYCONTIN) 15 mg 12 hr tablet Take 1 tablet (15 mg total) by mouth every 12 (twelve) hours. 01/06/21   Ghimire, Henreitta Leber, MD  pantoprazole (PROTONIX) 40 MG tablet Take 1 tablet (40 mg total) by mouth daily. 11/10/20   Samella Parr, NP  polyethylene glycol (MIRALAX / GLYCOLAX) 17 g packet Take 17 g by mouth daily as needed for mild constipation. 11/09/20   Samella Parr, NP  pregabalin (LYRICA) 75 MG capsule Take 1 capsule (75 mg total) by mouth at bedtime. 11/09/20   Samella Parr, NP  rifampin (RIFADIN) 300 MG capsule Take 1 capsule (300 mg total) by mouth every 8 (eight) hours. 6 weeks from 5/27 01/06/21   Jonetta Osgood, MD  senna-docusate (SENOKOT-S) 8.6-50 MG tablet Take 1 tablet by mouth 2 (two) times daily. 11/09/20   Samella Parr, NP  sevelamer carbonate (RENVELA) 800 MG tablet Take 1,600 mg by mouth 3 (three) times daily with meals.    [provider]  Vancomycin (VANCOCIN) 750-5 MG/150ML-% SOLN Inject 150 mLs (750 mg total) into the vein every Monday, Wednesday, and Friday with hemodialysis. 6 weeks of vancomycin from  5/27 12/31/20   Jonetta Osgood, MD   Current Facility-Administered Medications  Medication Dose Route Frequency Provider Last Rate Last Admin   acetaminophen (TYLENOL) tablet 650 mg  650 mg Oral Q6H PRN Karmen Bongo, MD       Or   acetaminophen (TYLENOL) suppository 650 mg  650 mg Rectal Q6H PRN Karmen Bongo, MD       aspirin EC tablet 325 mg  325 mg Oral Daily Karmen Bongo, MD       bisacodyl (DULCOLAX) suppository 10 mg  10 mg Rectal Daily PRN Karmen Bongo, MD       calcium carbonate (dosed in mg  elemental calcium) suspension 500 mg of elemental calcium  500 mg of elemental calcium Oral Q6H PRN Karmen Bongo, MD       camphor-menthol [facility]) lotion 1 application  1 application Topical [id number] PRN Karmen Bongo, MD       And   hydrOXYzine (ATARAX/VISTARIL) tablet 25 mg  25 mg Oral Q8H PRN Karmen Bongo, MD       [START ON 01/24/2021] cinacalcet (SENSIPAR) tablet 60 mg  60 mg Oral Q breakfast Karmen Bongo, MD       Derrill Memo ON 01/24/2021] diltiazem (CARDIZEM CD) 24 hr capsule 120 mg  120 mg Oral Daily Karmen Bongo, MD       docusate sodium (COLACE) capsule 100 mg  100 mg Oral BID Karmen Bongo, MD       docusate sodium (ENEMEEZ) enema 283 mg  1 enema Rectal PRN Karmen Bongo, MD       feeding supplement (NEPRO CARB STEADY) liquid 237 mL  237 mL Oral TID PRN Karmen Bongo, MD       heparin injection 5,000 Units  5,000 Units Subcutaneous Camelia Phenes Karmen Bongo, MD       lactated ringers bolus 250 mL  250 mL Intravenous Once Karmen Bongo, MD       methocarbamol (ROBAXIN) tablet 500 mg  500 mg Oral TID Karmen Bongo, MD       metoprolol tartrate (LOPRESSOR) tablet 25 mg  25 mg Oral BID Karmen Bongo, MD       ondansetron [facility]) tablet 4 mg  4 mg Oral Q6H PRN Karmen Bongo, MD       Or   ondansetron [facility]) injection 4 mg  4 mg Intravenous Q6H PRN Karmen Bongo, MD       oxyCODONE (OXYCONTIN) 12 hr tablet 15 mg  15 mg Oral Q12H Karmen Bongo, MD        pantoprazole (PROTONIX) EC tablet 40 mg  40 mg Oral Daily Karmen Bongo, MD       polyethylene glycol (MIRALAX / GLYCOLAX) packet 17 g  17 g Oral Daily PRN Karmen Bongo, MD       pregabalin (LYRICA) capsule 75 mg  75 mg Oral Ivery Quale, MD       rifampin (RIFADIN) capsule 300 mg  300 mg Oral Lynne Logan, MD       senna-docusate (Senokot-S) tablet 1 tablet  1 tablet Oral BID Karmen Bongo, MD       sevelamer carbonate (RENVELA) tablet 1,600 mg  1,600 mg Oral TID WC Karmen Bongo, MD       sodium chloride flush (NS) 0.9 % injection 3 mL  3 mL Intravenous Q12H Karmen Bongo, MD       sorbitol 70 % solution 30 mL  30 mL Oral PRN Karmen Bongo, MD       Derrill Memo ON 01/24/2021] vancomycin (VANCOREADY) IVPB 750 mg/150 mL  750 mg Intravenous Q M,W,F-HD Karmen Bongo, MD       zolpidem Lorrin Mais) tablet 5 mg  5 mg Oral QHS PRN Karmen Bongo, MD       Current Outpatient Medications  Medication Sig Dispense Refill   acetaminophen (TYLENOL) 325 MG tablet Take 650 mg by mouth every 6 (six) hours as needed for mild pain. Temp > 100.5     aspirin EC 325 MG EC tablet Take 1 tablet (325 mg total) by mouth daily. 30 tablet 0   bisacodyl (DULCOLAX) 10 MG suppository Place 1 suppository (10 mg total) rectally daily as needed  for moderate constipation. 12 suppository 0   cinacalcet (SENSIPAR) 30 MG tablet Take 2 tablets (60 mg total) by mouth daily with breakfast. 60 tablet    Darbepoetin Alfa (ARANESP, ALBUMIN FREE, IJ) Darbepoetin Alfa (Aranesp)     diltiazem (CARDIZEM CD) 120 MG 24 hr capsule Take 1 capsule (120 mg total) by mouth daily.     docusate sodium (COLACE) 100 MG capsule Take 1 capsule (100 mg total) by mouth 2 (two) times daily. 10 capsule 0   methocarbamol (ROBAXIN) 500 MG tablet Take 1 tablet (500 mg total) by mouth 3 (three) times daily.     metoprolol tartrate (LOPRESSOR) 50 MG tablet Take 0.5 tablets (25 mg total) by mouth 2 (two) times daily.     Multiple Vitamin  (MULTIVITAMIN WITH MINERALS) TABS tablet Take 1 tablet by mouth daily.     nitroGLYCERIN (NITROSTAT) 0.4 MG SL tablet Place 0.4 mg under the tongue every 5 (five) minutes x 3 doses as needed for chest pain.      Nutritional Supplements (,FEEDING SUPPLEMENT, PROSOURCE PLUS) liquid Take 30 mLs by mouth 2 (two) times daily between meals.     Nutritional Supplements (FEEDING SUPPLEMENT, NEPRO CARB STEADY,) LIQD Take 237 mLs by mouth 3 (three) times daily between meals.  0   oxyCODONE (OXYCONTIN) 15 mg 12 hr tablet Take 1 tablet (15 mg total) by mouth every 12 (twelve) hours. 14 tablet 0   pantoprazole (PROTONIX) 40 MG tablet Take 1 tablet (40 mg total) by mouth daily.     polyethylene glycol (MIRALAX / GLYCOLAX) 17 g packet Take 17 g by mouth daily as needed for mild constipation. 14 each 0   pregabalin (LYRICA) 75 MG capsule Take 1 capsule (75 mg total) by mouth at bedtime. 60 capsule 0   rifampin (RIFADIN) 300 MG capsule Take 1 capsule (300 mg total) by mouth every 8 (eight) hours. 6 weeks from 5/27     senna-docusate (SENOKOT-S) 8.6-50 MG tablet Take 1 tablet by mouth 2 (two) times daily.     sevelamer carbonate (RENVELA) 800 MG tablet Take 1,600 mg by mouth 3 (three) times daily with meals.     Vancomycin (VANCOCIN) 750-5 MG/150ML-% SOLN Inject 150 mLs (750 mg total) into the vein every Monday, Wednesday, and Friday with hemodialysis. 6 weeks of vancomycin from 5/27 4000 mL    Facility-Administered Medications Ordered in Other Encounters  Medication Dose Route Frequency Provider Last Rate Last Admin   regadenoson (LEXISCAN) injection SOLN 0.4 mg  0.4 mg Intravenous Once Cherlynn Kaiser A, MD       technetium tetrofosmin (TC-MYOVIEW) injection [id number] millicurie  [id number] millicurie Intravenous Once PRN Elouise Munroe, MD       technetium tetrofosmin (TC-MYOVIEW) injection [id number] millicurie  [id number] millicurie Intravenous Once PRN Elouise Munroe, MD       Labs: Basic Metabolic Panel: Recent Labs   Lab 01/23/21 0920  NA 133*  K 4.1  CL 91*  CO2 22  GLUCOSE 85  BUN 25*  CREATININE 6.32*  CALCIUM 8.1*   Liver Function Tests: Recent Labs  Lab 01/23/21 0920  AST 86*  ALT 32  ALKPHOS 82  BILITOT 3.9*  PROT 8.6*  ALBUMIN 2.3*   CBC: Recent Labs  Lab 01/23/21 0920  WBC 17.5*  NEUTROABS 14.6*  HGB 9.4*  HCT 33.8*  MCV 91.6  PLT 259   Studies/Results: CT Angio Chest PE W and/or Wo Contrast  Result Date: 01/23/2021 CLINICAL DATA:  Patient with chest pain. EXAM:  CT ANGIOGRAPHY CHEST WITH CONTRAST TECHNIQUE: Multidetector CT imaging of the chest was performed using the standard protocol during bolus administration of intravenous contrast. Multiplanar CT image reconstructions and MIPs were obtained to evaluate the vascular anatomy. CONTRAST:  55m OMNIPAQUE IOHEXOL 350 MG/ML SOLN COMPARISON:  CT chest 09/29/2020. FINDINGS: Cardiovascular: Heart is enlarged, particularly right heart. There is reflux of contrast into the hepatic veins. Small pericardial effusion. Thoracic aortic vascular calcifications. Adequate opacification of the pulmonary arteries. Motion artifact limits evaluation of the distal pulmonary arteries. Within the above limitation, no evidence for acute pulmonary embolus. Mediastinum/Nodes: No enlarged axillary, mediastinal or hilar lymphadenopathy. Normal appearance of the esophagus. Lungs/Pleura: Central airways are patent. There are small bilateral pleural effusions. The right pleural effusion appears partially loculated. Bandlike patchy ground-glass and consolidative opacities throughout the lungs bilaterally. Upper Abdomen: Unremarkable. Musculoskeletal: Thoracic spine degenerative changes. There is osseous destruction of the superior endplate of the [id number] body. There is loss of disc space height at the T10-T11 level. This is most compatible with discitis osteomyelitis is demonstrated on prior thoracic spine MRI 12/25/2020. Review of the MIP images confirms  the above findings. IMPRESSION: 1. No evidence for acute pulmonary embolus. 2. Bilateral pleural effusions. The right pleural effusion appears to be partially loculated. There are scattered bandlike consolidative opacities which may represent atelectasis and or infection. 3. T10-T11 discitis osteomyelitis as demonstrated on prior MRI of the thoracic spine 12/25/2020. 4. The right heart is enlarged. Secondary findings suggestive of right heart failure including reflux of contrast into the hepatic veins. 5. Aortic Atherosclerosis (ICD10-I70.0). Electronically Signed   By: DLovey NewcomerM.D.   On: 01/23/2021 12:45   DG Chest Portable 1 View  Result Date: 01/23/2021 CLINICAL DATA:  Chest pain. EXAM: PORTABLE CHEST 1 VIEW COMPARISON:  Prior chest radiographs 12/22/2020 and earlier. FINDINGS: Cardiomegaly. Prior cardiac valve replacement. Central pulmonary vascular congestion. Patchy bilateral airspace opacities. Redemonstrated architectural distortion within the right mid lung. Right greater than left pleural effusions, increased from the prior chest radiograph of 12/22/2020. The right pleural effusion may be loculated. No evidence of pneumothorax. No acute bony abnormality identified. A vascular stent projects in the left subclavicular region. IMPRESSION: Cardiomegaly with pulmonary vascular congestion. Patchy bilateral pulmonary opacities have progressed as compared to the chest radiograph of 12/22/2020 and are favored to reflect pulmonary edema. Pneumonia considered less likely, although not excluded. Progressive right greater than left pleural effusions. The right pleural effusion may be loculated. Redemonstrated architectural distortion within the right mid lung. Electronically Signed   By: KKellie SimmeringDO   On: 01/23/2021 10:26    Dialysis Orders:  [facility]500, EDW 67kg, 2K/2Ca, UFP#1, AVG, heparin 5000 - Mircera 2071m Iv q 2 weeks (last '150mg'$  on 6/6) - No VDRA - Has been getting Vanc '750mg'$  x  HD, does not appear to have missed any. Vanc trough 17.3 on 01/17/21.  Assessment/Plan:  Sepsis/Leukocytosis: Recurrent admits for MRSA bacteremia with endocarditis + osteomyelitis. CXR with B effusions, of which the R side may be loculated - may need thoracentesis. Sepsis protocols initiated - reduced IVF bolus d/t ESRD/overload status. ID/pharmacy to continue Vanc/Rifampin. Elevated troponin: Likely demand ischemia, per cardiology - follow. Dyspnea/Pleural effusions: HD tonight to help correct. ESRD:  Usual MWF schedule - Will go ahead and plan to dialyze tonight for volume correction. Has not missed any dialysis, likely has lost further body weight and dry weight was not adjusted accurately.  Hypertension/volume: BP high, resume home meds and should  improve with HD/UF.  Anemia: Hgb 9.4 - due for ESA, will give with HD tonight.  Metabolic bone disease: Ca ok, Phos pending.   Nutrition:  Adding protein supplements.  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

## 2021-01-23 NOTE — ED Notes (Signed)
Pt is a hard stick & tech has tried to stick pt for due labs.

## 2021-01-23 NOTE — ED Triage Notes (Signed)
Pt BIB [facility] from [facility], c/o burning CP starting at 0630 this am, facility gave nitro x2, 324 mg ASA  and applied 2L [place]. Pain 8/10 after nitro 6/10.   Dialysis M/W/F had a full session on Friday. Lungs clear bilateral   BP 133/84 HR 106 94% RA  CBG 94

## 2021-01-23 NOTE — Procedures (Signed)
   I was present at this dialysis session, have reviewed the session itself and made  appropriate changes Kelly Splinter MD [place] pager [phone]   01/23/2021, 8:33 PM

## 2021-01-23 NOTE — Progress Notes (Addendum)
Pharmacy Antibiotic Note  Patrick Anthony is a 50 y.o. male admitted on 01/23/2021 with sepsis, hx of MRSA bacteremia with prosthetic valve endocarditis on vancomycin + rifampin per ID planned through 7/15, completed gent tx.  Pharmacy has been consulted for vancomycin dosing.  ESRD-HD usually MWF, last HD/vanc on 6/17  Plan: Continue vancomycin 750 mg IV q HD > plan 750 mg IV x 1 with extra HD session today F/u ID eval and recs, monitor HD schedule Vancomycin random level before next HD     Temp (24hrs), Avg:98.8 F (37.1 C), Min:98.8 F (37.1 C), Max:98.8 F (37.1 C)  Recent Labs  Lab 01/23/21 0901 01/23/21 0920  WBC  --  17.5*  CREATININE  --  6.32*  LATICACIDVEN 4.8*  --     Estimated Creatinine Clearance: 13.5 mL/min (A) (by C-G formula based on SCr of 6.32 mg/dL (H)).    No Known Allergies  Bertis Ruddy, PharmD Clinical Pharmacist ED Pharmacist Phone # [phone] 01/23/2021 11:23 AM

## 2021-01-23 NOTE — H&P (Addendum)
History and Physical    Patrick Anthony [id number] DOB: April 04, 1971 DOA: 01/23/2021  PCP: [name], MD Consultants:  [place] [facility] [place]; Sharol Given - orthopedics; [place]; [place] - cardiology; nephrology Patient coming from: [facility]; [facility]:   Chief Complaint: CP  HPI: Patrick Anthony is a 50 y.o. 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  He was recommended to have 6 weeks of Vanc/Rifampin treatment (through 7/8) and completed 2 weeks of gentamicin treatment.  CT surgery determined that he is not a candidate for redo valve replacement.  He reports since d/c he has noticed what "feels like heartburn" periodically - particularly at HD sessions.  He noticed it today and it lasted a long time so he decided to come in for evaluation.  He is noticing some SOB, including PND and periodic orthopnea.  No fever or cough.      ED Course: h/o multiple septic emboli, new valve/AKA.  Readmitted last month for infection, still on abx at HD.  Came in for CP overnight.  Mild tachycardia, troponin 1110.  Clean cath in march, trop 700 then, cardiology didn't recommend heparin.  Treating for persistent septic emboli.    Review of Systems: As per HPI; otherwise review of systems reviewed and negative.   Ambulatory Status:  s/p R AKA  COVID Vaccine Status:  None  Past Medical History:  Diagnosis Date   Bacterial endocarditis  09/27/2020   MRSA   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 09/29/2020   Patient needs f/u CT chest 3 months   Chronic kidney disease    patient on dialysis tues-thurs-sat   Cirrhosis of liver ([place])    Drug addiction ([place])    ETOH abuse    Hepatitis    Hypertension    Mitral regurgitation    Patent foramen ovale 10/23/2019   Renal disorder renal failure   S/P minimally-invasive mitral valve replacement with bioprosthetic valve 10/14/2020   31 mm Medtronic Mosaic stented porcine bioprosthetic tissue valve via right mini thoracotomy approach   Sleep apnea    Systolic and diastolic CHF, 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[place]) 04/19/2013    Past Surgical History:  Procedure Laterality Date   AMPUTATION Right 10/29/2020   Procedure: RIGHT ABOVE KNEE AMPUTATION;  Surgeon: [name], MD;  Location: [place];  Service: Orthopedics;  Laterality: Right;   AV FISTULA PLACEMENT Right    AV FISTULA PLACEMENT Left 09/03/2013   Procedure: ARTERIOVENOUS (AV) FISTULA CREATION - LEFT BRACHIOCEPHALIC;  Surgeon: Conrad Walnut, MD;  Location: [place];  Service: Vascular;  Laterality: Left;   AV FISTULA PLACEMENT Left 10/30/2019   Procedure: INSERTION OF ARTERIOVENOUS (AV) GORE-TEX GRAFT ARM;  Surgeon: Rosetta Posner, MD;  Location: [place];  Service: Vascular;  Laterality: Left;   BUBBLE STUDY  10/05/2020   Procedure: BUBBLE STUDY;  Surgeon: Pixie Casino, MD;  Location: [place];  Service: Cardiovascular;;   I & D EXTREMITY Right 10/20/2020   Procedure: IRRIGATION AND  DEBRIDEMENT OF LEG;  Surgeon: [name], MD;  Location: [place];  Service: Orthopedics;  Laterality: Right;   INSERTION OF DIALYSIS CATHETER N/A 08/29/2013   Procedure: INSERTION OF DIALYSIS CATHETER;  Surgeon: Mal Misty, MD;  Location: [place];  Service: Vascular;  Laterality: N/A;   INSERTION OF DIALYSIS CATHETER Left 10/30/2019   Procedure: INSERTION OF DIALYSIS CATHETER LEFT INTERNAL JUGULAR;  Surgeon: Rosetta Posner, MD;  Location: [place];  Service:  Vascular;  Laterality: Left;   LEFT HEART CATH AND CORONARY ANGIOGRAPHY N/A 10/07/2020   Procedure: LEFT HEART CATH AND CORONARY ANGIOGRAPHY;  Surgeon: Lorretta Harp, MD;  Location: [place] [facility];  Service: Cardiovascular;  Laterality: N/A;   MITRAL VALVE REPAIR Right 10/14/2020   Procedure: MINIMALLY INVASIVE MITRAL VALVE  REPLACEMENT(MVR) USING MEDTRONIC MOSAIC VALVE SIZE 31MM;  Surgeon: Rexene Alberts, MD;  Location: [place];  Service: Open Heart Surgery;  Laterality: Right;   MULTIPLE EXTRACTIONS WITH ALVEOLOPLASTY N/A 10/12/2020   Procedure: MULTIPLE EXTRACTION WITH ALVEOLOPLASTY;  Surgeon: [name], DMD;  Location: [place];  Service: Dentistry;  Laterality: N/A;   PERIPHERAL VASCULAR CATHETERIZATION N/A 01/06/2015   Procedure: A/V Shuntogram/Fistulagram;  Surgeon: Katha Cabal, MD;  Location: [place] [facility];  Service: Cardiovascular;  Laterality: N/A;   PERIPHERAL VASCULAR CATHETERIZATION Left 01/06/2015   Procedure: A/V Shunt Intervention;  Surgeon: Katha Cabal, MD;  Location: [place] [facility];  Service: Cardiovascular;  Laterality: Left;   REPAIR OF PATENT FORAMEN OVALE N/A 10/14/2020   Procedure: CLOSURE OF PATENT FORAMEN OVALE;  Surgeon: Rexene Alberts, MD;  Location: [place];  Service: Open Heart Surgery;  Laterality: N/A;   TEE WITHOUT CARDIOVERSION N/A 10/05/2020   Procedure: TRANSESOPHAGEAL ECHOCARDIOGRAM (TEE);  Surgeon: Pixie Casino, MD;  Location: [place];  Service: Cardiovascular;  Laterality: N/A;   TEE WITHOUT CARDIOVERSION N/A 10/14/2020   Procedure: TRANSESOPHAGEAL ECHOCARDIOGRAM (TEE);  Surgeon: Rexene Alberts, MD;  Location: [place];  Service: Open Heart Surgery;  Laterality: N/A;   TEE WITHOUT CARDIOVERSION N/A 12/24/2020   Procedure: TRANSESOPHAGEAL ECHOCARDIOGRAM (TEE);  Surgeon: Pixie Casino, MD;  Location: [facility] ENDOSCOPY;  Service: Cardiovascular;  Laterality: N/A;    Social History   Socioeconomic History   Marital status: Single     Spouse name: Not on file   Number of children: Not on file   Years of education: Not on file   Highest education level: Not on file  Occupational History   Not on file  Tobacco Use   Smoking status: Former    Packs/day: 0.20    Pack years: 0.00    Types: Cigarettes    Quit date: 10/05/2017    Years since quitting: 3.3   Smokeless tobacco: Never   Tobacco comments:    Quit 3 years ago  Vaping Use   Vaping Use: Never used  Substance and Sexual Activity   Alcohol use: No   Drug use: No    Comment: Quit   Sexual activity: Not Currently    Birth control/protection: Condom  Other Topics Concern   Not on file  Social History Narrative   ** Merged History Encounter **       Social Determinants of Health   Financial Resource Strain: Not on file  Food Insecurity: Not on file  Transportation Needs: Not on file  Physical Activity: Not on file  Stress: Not on file  Social Connections: Not on file  Intimate Partner Violence: Not on  file    No Known Allergies  Family History  Problem Relation Age of Onset   Hypertension Mother    Cancer Mother        breast    Prior to Admission medications   Medication Sig Start Date End Date Taking? Authorizing Provider  acetaminophen (TYLENOL) 325 MG tablet Take 650 mg by mouth every 6 (six) hours as needed for mild pain. Temp > 100.5    [provider]  aspirin EC 325 MG EC tablet Take 1 tablet (325 mg total) by mouth daily. 11/10/20   Samella Parr, NP  bisacodyl (DULCOLAX) 10 MG suppository Place 1 suppository (10 mg total) rectally daily as needed for moderate constipation. 11/09/20   Samella Parr, NP  cinacalcet (SENSIPAR) 30 MG tablet Take 2 tablets (60 mg total) by mouth daily with breakfast. 11/10/20   Samella Parr, NP  Darbepoetin Alfa (ARANESP, ALBUMIN FREE, IJ) Darbepoetin Alfa (Aranesp) 07/05/20 07/04/21  [provider]  diltiazem (CARDIZEM CD) 120 MG 24 hr capsule Take 1 capsule (120 mg total) by  mouth daily. 01/07/21   Ghimire, Henreitta Leber, MD  docusate sodium (COLACE) 100 MG capsule Take 1 capsule (100 mg total) by mouth 2 (two) times daily. 11/09/20   Samella Parr, NP  methocarbamol (ROBAXIN) 500 MG tablet Take 1 tablet (500 mg total) by mouth 3 (three) times daily. 11/09/20   Samella Parr, NP  metoprolol tartrate (LOPRESSOR) 50 MG tablet Take 0.5 tablets (25 mg total) by mouth 2 (two) times daily. 01/06/21   Ghimire, Henreitta Leber, MD  Multiple Vitamin (MULTIVITAMIN WITH MINERALS) TABS tablet Take 1 tablet by mouth daily.    [provider]  nitroGLYCERIN (NITROSTAT) 0.4 MG SL tablet Place 0.4 mg under the tongue every 5 (five) minutes x 3 doses as needed for chest pain.     [provider]  Nutritional Supplements (,FEEDING SUPPLEMENT, PROSOURCE PLUS) liquid Take 30 mLs by mouth 2 (two) times daily between meals. 11/09/20   Samella Parr, NP  Nutritional Supplements (FEEDING SUPPLEMENT, NEPRO CARB STEADY,) LIQD Take 237 mLs by mouth 3 (three) times daily between meals. 11/09/20   Samella Parr, NP  oxyCODONE (OXYCONTIN) 15 mg 12 hr tablet Take 1 tablet (15 mg total) by mouth every 12 (twelve) hours. 01/06/21   Ghimire, Henreitta Leber, MD  pantoprazole (PROTONIX) 40 MG tablet Take 1 tablet (40 mg total) by mouth daily. 11/10/20   Samella Parr, NP  polyethylene glycol (MIRALAX / GLYCOLAX) 17 g packet Take 17 g by mouth daily as needed for mild constipation. 11/09/20   Samella Parr, NP  pregabalin (LYRICA) 75 MG capsule Take 1 capsule (75 mg total) by mouth at bedtime. 11/09/20   Samella Parr, NP  rifampin (RIFADIN) 300 MG capsule Take 1 capsule (300 mg total) by mouth every 8 (eight) hours. 6 weeks from 5/27 01/06/21   Jonetta Osgood, MD  senna-docusate (SENOKOT-S) 8.6-50 MG tablet Take 1 tablet by mouth 2 (two) times daily. 11/09/20   Samella Parr, NP  sevelamer carbonate (RENVELA) 800 MG tablet Take 1,600 mg by mouth 3 (three) times daily with meals.    [provider]  Vancomycin (VANCOCIN) 750-5 MG/150ML-% SOLN Inject 150 mLs (750 mg total) into the vein every Monday, Wednesday, and Friday with hemodialysis. 6 weeks of vancomycin from 5/27 12/31/20   Jonetta Osgood, MD    Physical Exam: Vitals:   01/23/21 1315 01/23/21 1330 01/23/21  1345 01/23/21 1400  BP: (!) 127/97 (!) 127/98 (!) 128/100 (!) 129/100  Pulse: 100 99 100   Resp: (!) 34 12 (!) 33 18  Temp:      TempSrc:      SpO2: 94% 99% 99%      General:  Appears calm and comfortable and is in NAD, flat affect, ?cognitive delay Eyes:  PERRL, EOMI, normal lids, iris ENT:  grossly normal hearing, lips & tongue, mmm Neck:  no LAD, masses or thyromegaly Cardiovascular:  RR with mild tachycardia, no m/r/g. No LE edema.  Respiratory:   CTA bilaterally with no wheezes/rales/rhonchi.  Mildly increased respiratory effort. Abdomen:  soft, NT, ND Skin:  no rash or induration seen on limited exam Musculoskeletal:  grossly normal tone BUE/BLE, good ROM, no bony abnormality, s/p R AKA with appropriate stump healing Psychiatric:  flat mood and affect, speech fluent and appropriate, ?mild cognitive impairment Neurologic:  CN 2-12 grossly intact, moves all extremities in coordinated fashion    Radiological Exams on Admission: Independently reviewed - see discussion in A/P where applicable  CT Angio Chest PE W and/or Wo Contrast  Result Date: 01/23/2021 CLINICAL DATA:  Patient with chest pain. EXAM: CT ANGIOGRAPHY CHEST WITH CONTRAST TECHNIQUE: Multidetector CT imaging of the chest was performed using the standard protocol during bolus administration of intravenous contrast. Multiplanar CT image reconstructions and MIPs were obtained to evaluate the vascular anatomy. CONTRAST:  62m OMNIPAQUE IOHEXOL 350 MG/ML SOLN COMPARISON:  CT chest 09/29/2020. FINDINGS: Cardiovascular: Heart is enlarged, particularly right heart. There is reflux of contrast into the hepatic veins. Small pericardial  effusion. Thoracic aortic vascular calcifications. Adequate opacification of the pulmonary arteries. Motion artifact limits evaluation of the distal pulmonary arteries. Within the above limitation, no evidence for acute pulmonary embolus. Mediastinum/Nodes: No enlarged axillary, mediastinal or hilar lymphadenopathy. Normal appearance of the esophagus. Lungs/Pleura: Central airways are patent. There are small bilateral pleural effusions. The right pleural effusion appears partially loculated. Bandlike patchy ground-glass and consolidative opacities throughout the lungs bilaterally. Upper Abdomen: Unremarkable. Musculoskeletal: Thoracic spine degenerative changes. There is osseous destruction of the superior endplate of the T624THLvertebral body. There is loss of disc space height at the T10-T11 level. This is most compatible with discitis osteomyelitis is demonstrated on prior thoracic spine MRI 12/25/2020. Review of the MIP images confirms the above findings. IMPRESSION: 1. No evidence for acute pulmonary embolus. 2. Bilateral pleural effusions. The right pleural effusion appears to be partially loculated. There are scattered bandlike consolidative opacities which may represent atelectasis and or infection. 3. T10-T11 discitis osteomyelitis as demonstrated on prior MRI of the thoracic spine 12/25/2020. 4. The right heart is enlarged. Secondary findings suggestive of right heart failure including reflux of contrast into the hepatic veins. 5. Aortic Atherosclerosis (ICD10-I70.0). Electronically Signed   By: DLovey NewcomerM.D.   On: 01/23/2021 12:45   DG Chest Portable 1 View  Result Date: 01/23/2021 CLINICAL DATA:  Chest pain. EXAM: PORTABLE CHEST 1 VIEW COMPARISON:  Prior chest radiographs 12/22/2020 and earlier. FINDINGS: Cardiomegaly. Prior cardiac valve replacement. Central pulmonary vascular congestion. Patchy bilateral airspace opacities. Redemonstrated architectural distortion within the right mid lung. Right  greater than left pleural effusions, increased from the prior chest radiograph of 12/22/2020. The right pleural effusion may be loculated. No evidence of pneumothorax. No acute bony abnormality identified. A vascular stent projects in the left subclavicular region. IMPRESSION: Cardiomegaly with pulmonary vascular congestion. Patchy bilateral pulmonary opacities have progressed as compared to the chest radiograph of  12/22/2020 and are favored to reflect pulmonary edema. Pneumonia considered less likely, although not excluded. Progressive right greater than left pleural effusions. The right pleural effusion may be loculated. Redemonstrated architectural distortion within the right mid lung. Electronically Signed   By: Kellie Simmering DO   On: 01/23/2021 10:26    EKG: Independently reviewed.  Sinus tachycardia with rate 110; nonspecific ST changes    Labs on Admission: I have personally reviewed the available labs and imaging studies at the time of the admission.  Pertinent labs:   BUN 25/Creatinine 6.32/GFR 10 Anion gap 20 Albumin 2.3 HS troponin 1110 Lactate 4.8 WBC 17.5 Hgb 9.4 INR 1.6   Assessment/Plan Principal Problem:   Sepsis due to undetermined organism [facility]) Active Problems:   ESRD on dialysis (HCC)   Lactic acidosis   S/P mitral valve replacement   S/P AKA (above knee amputation), right (HCC)   Chronic pain disorder   Sepsis -Patient with h/o MRSA bacteremia associated with endocarditis, multiple septic emboli, and discitis presenting with primarily complaint of heartburn but also with SOB -SIRS criteria in this patient includes: Leukocytosis, tachycardia, tachypnea, hypoxia -Patient has evidence of acute organ failure with elevated lactate >2 that is not easily explained by another condition. -While awaiting blood cultures, this appears to be a preseptic condition. -Sepsis protocol initiated -Patient had initial lactate >4 but has not received the 30 cc/kg IVF bolus; this was  ordered but nephrology was concerned about volume overload in this patient and so canceled additional IVF and plans to do HD tonight. -Suspected source is unknown, but concern for ongoing valvular infection/discitis. -Blood and urine cultures pending -Will admit for ongoing evaluation/treatment -Treat with IV Vanc/Rifampin for now -ID consult requested -Will trend lactate to ensure improvement -Will order procalcitonin level.    Elevated troponin  -Patient does not c/o chest pain but was found to have significantly elevated troponin -It is difficult to interpret trop in the setting of ESRD -Normal coronary arteries in 10/2020, which is quite reassuring -Cardiology has consulted  ESRD -Patient on chronic MWF HD -Nephrology prn order set utilized -Nephrology is planning to do HD tonight -Continue Sensipar, Sevelamer  SVT/MVP -Currently in sinus tachycardia -Continue Cardizem, Lopressor -Continue ASA -He is s/p mitral valve replacement with bioprosthetic valve but most recently had endocarditis of both this valve and his native aortic valve - but is no longer a surgical candidate  Chronic pain -I have reviewed this patient in the [place] Controlled Substances Reporting System.  He is receiving medications from only one provider and appears to be taking them as prescribed. -I have reviewed this patient in the  Controlled Substances Reporting System.  He is receiving medications from two providers but appears to be taking them as prescribed. -He is not at particularly high risk of opioid misuse, diversion, or overdose.  -Continue Percocet, Lyrica  Cirrhosis -h/o Hep C -ID consult is pending but this will likely be an issue for outpatient f/u      Note: This patient has been tested and is negative for the novel coronavirus COVID-19. He has NOT been vaccinated against COVID-19.    DVT prophylaxis:  Heparin Code Status:  Full - confirmed with patient Family Communication: None  present Disposition Plan:  The patient is from: SNF rehab  Anticipated d/c is to: SNF rehab  Anticipated d/c date will depend on clinical response to treatment, likely 2-3 days  Patient is currently: acutely ill Consults called:  Cardiology; ID; Nephrology; consider palliative care Admission  status: Admit - It is my clinical opinion that admission to INPATIENT is reasonable and necessary because of the expectation that this patient will require hospital care that crosses at least 2 midnights to treat this condition based on the medical complexity of the problems presented.  Given the aforementioned information, the predictability of an adverse outcome is felt to be significant.     Karmen Bongo MD [facility]   How to contact the [facility], [facility], [facility] Attending or Consulting provider [place] or covering provider during after hours [place], for this patient?  Check the care team in [facility] and look for a) attending/consulting TR[facility] provider listed and b) the [facility] team listed Log into [email or web address] and use [place]'s universal password to access. If you do not have the password, please contact the hospital operator. Locate the [facility] provider you are looking for under [facility] and page to a number that you can be directly reached. If you still have difficulty reaching the provider, please page the [facility] (Director on Call) for the Hospitalists listed on amion for assistance.   01/23/2021, 2:35 PM

## 2021-01-23 NOTE — Consult Note (Signed)
[name]                                                                                        Patient Identification: Patient Name: Patrick Anthony MRN: [id number] [name] Date: 01/23/2021  8:39 AM Today's Date: 01/23/2021 Reason for consult: Prosthetic valve endocarditis Requesting provider: Karmen Bongo  Active Problems:   Sepsis due to undetermined organism [facility])   Antibiotics: Vancomycin/rifampin  Lines/Tubes: Left left upper arm fistula  Assessment Left Chest pain, elevated troponin r/o ACS - Cardiology Following  Bilateral Pleural Effusion with concerns of loculation in the rt side with scattered bandlike consolidative opacities   MRSA bacteremia Prosthetic valve endocarditis, MV, complicated with CNS septic emboli Aortic valve endocarditis Not a surgical candidate per T CTS  T10-T11 discitis and osteomyelitis ESRD on hemodialysis via left arm AV graft Chronic hepatitis C  Recommendations  Continue vancomycin, pharmacy to dose and rifampin.  Has completed 2 weeks of gentamicin earlier for PV endocarditis  Follow-up blood cultures Repeat TTE given bilateral pleural effusion for assessing valves. May need CTSx re-evaluation pending TTE findings  Will need to consider thoracentesis of right pleural fluid given loculation if pleural effusion is not related to be due to valvulopathy/or volume overload Monitor CBC CMP and vancomycin trough Cardiology following for chest pain   New ID team will follow for Monday  Rest of the management as per the primary team. Please call with questions or concerns.  Thank you for the consult  Rosiland Oz, MD Infectious Disease Physician [facility] [address] 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  Pain is associated with shortness of breath.  Chest pain apparently lasted for 3 hours and improved with nitroglycerin.  He states he had prior episode of left-sided chest pain mostly in the morning but would spontaneously be resolve.however it lasted for 3 hours and he had to come to the ED. denies any fevers chills and sweats.  Denies nausea vomiting and diarrhea.  Appetite is good.  At ED, he was afebrile.  Labs remarkable for TB 3.9 AST 86, ALT 32, troponin 1110 WBC 17.5 with bands Chest x-ray with patchy bilateral pulmonary opacities that have progressed as compared to the chest radiograph of 12/22/2020 and are favored to reflect pulmonary edema.  Pneumonia considered less likely.  Progressive right greater than left pleural effusions.  The right pleural effusion may be loculated.    ROS: General- Denies fever, chills, loss of appetite and loss of weight HEENT - Denies headache, blurry vision, neck pain, sinus pain Chest - Denies any cough. CHEST PAIN AND SOB+ CVS- Denies any dizziness/lightheadedness, syncopal attacks, palpitations Abdomen- Denies any nausea, vomiting, abdominal pain, hematochezia and diarrhea Neuro - Denies any  weakness, numbness, tingling sensation Psych - Denies any changes in mood irritability or depressive symptoms GU- Denies any burning, dysuria, hematuria or increased frequency of urination Skin - denies any rashes/lesions MSK - denies any joint pain/swelling or restricted ROM   Past  Medical History:  Diagnosis Date   Bacterial endocarditis 09/27/2020   MRSA   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 09/29/2020   Patient needs f/u CT chest 3 months   Chronic kidney disease    patient on dialysis tues-thurs-sat   Cirrhosis of liver (HCC)    Drug addiction ([place])    ETOH abuse    Hepatitis    Hypertension    Mitral regurgitation    Patent foramen ovale 10/23/2019   Renal disorder renal failure   S/P minimally-invasive mitral valve replacement with bioprosthetic valve 10/14/2020   31 mm Medtronic Mosaic stented porcine bioprosthetic tissue valve via right mini thoracotomy approach   Sleep apnea    Systolic and diastolic CHF, 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[place]) 04/19/2013   Past Surgical History:  Procedure Laterality Date   AMPUTATION Right 10/29/2020   Procedure: RIGHT ABOVE KNEE AMPUTATION;  Surgeon: [name], MD;  Location: [place];  Service: Orthopedics;  Laterality: Right;   AV FISTULA PLACEMENT Right    AV FISTULA PLACEMENT Left 09/03/2013   Procedure: ARTERIOVENOUS (AV) FISTULA CREATION - LEFT BRACHIOCEPHALIC;  Surgeon: Conrad Harris, MD;  Location: [place];  Service: Vascular;  Laterality: Left;   AV FISTULA PLACEMENT Left 10/30/2019   Procedure: INSERTION OF ARTERIOVENOUS (AV) GORE-TEX GRAFT ARM;  Surgeon: Rosetta Posner, MD;  Location: [place];  Service: Vascular;  Laterality: Left;   BUBBLE STUDY  10/05/2020   Procedure: BUBBLE STUDY;  Surgeon: Pixie Casino, MD;  Location: [place];  Service: Cardiovascular;;   I & D EXTREMITY Right 10/20/2020   Procedure: IRRIGATION AND DEBRIDEMENT OF LEG;  Surgeon: [name], MD;  Location: [place];  Service: Orthopedics;  Laterality: Right;   INSERTION OF DIALYSIS CATHETER N/A 08/29/2013   Procedure: INSERTION OF DIALYSIS CATHETER;  Surgeon: Mal Misty, MD;  Location: [place];  Service: Vascular;  Laterality: N/A;   INSERTION OF DIALYSIS CATHETER Left 10/30/2019   Procedure: INSERTION OF DIALYSIS CATHETER LEFT INTERNAL JUGULAR;   Surgeon: Rosetta Posner, MD;  Location: [place];  Service: Vascular;  Laterality: Left;   LEFT HEART CATH AND CORONARY ANGIOGRAPHY N/A 10/07/2020   Procedure: LEFT HEART CATH AND CORONARY ANGIOGRAPHY;  Surgeon: Lorretta Harp, MD;  Location: [place] [facility];  Service: Cardiovascular;  Laterality: N/A;   MITRAL VALVE REPAIR Right 10/14/2020   Procedure: MINIMALLY INVASIVE MITRAL VALVE  REPLACEMENT(MVR) USING MEDTRONIC MOSAIC VALVE SIZE 31MM;  Surgeon: Rexene Alberts, MD;  Location: [place];  Service: Open Heart Surgery;  Laterality: Right;   MULTIPLE EXTRACTIONS WITH ALVEOLOPLASTY N/A 10/12/2020   Procedure: MULTIPLE EXTRACTION WITH ALVEOLOPLASTY;  Surgeon: [name], DMD;  Location: [place];  Service: Dentistry;  Laterality: N/A;   PERIPHERAL VASCULAR CATHETERIZATION N/A 01/06/2015   Procedure: A/V Shuntogram/Fistulagram;  Surgeon: Katha Cabal, MD;  Location: [place] [facility];  Service: Cardiovascular;  Laterality: N/A;   PERIPHERAL VASCULAR CATHETERIZATION Left 01/06/2015   Procedure: A/V Shunt Intervention;  Surgeon: Katha Cabal, MD;  Location: [place] [facility];  Service: Cardiovascular;  Laterality: Left;   REPAIR OF PATENT FORAMEN OVALE N/A 10/14/2020   Procedure: CLOSURE OF PATENT FORAMEN OVALE;  Surgeon: Rexene Alberts, MD;  Location:  [place] [facility];  Service: Open Heart Surgery;  Laterality: N/A;   TEE WITHOUT CARDIOVERSION N/A 10/05/2020   Procedure: TRANSESOPHAGEAL ECHOCARDIOGRAM (TEE);  Surgeon: Pixie Casino, MD;  Location: [place];  Service: Cardiovascular;  Laterality: N/A;   TEE WITHOUT CARDIOVERSION N/A 10/14/2020   Procedure: TRANSESOPHAGEAL ECHOCARDIOGRAM (TEE);  Surgeon: Rexene Alberts, MD;  Location: [place];  Service: Open Heart Surgery;  Laterality: N/A;   TEE WITHOUT CARDIOVERSION N/A 12/24/2020   Procedure: TRANSESOPHAGEAL ECHOCARDIOGRAM (TEE);  Surgeon: Pixie Casino, MD;  Location: [facility];  Service: Cardiovascular;  Laterality: N/A;      Scheduled Meds: Continuous Infusions:  lactated ringers 150 mL/hr at 01/23/21 1209   [START ON 01/24/2021] vancomycin     PRN Meds:.  No Known Allergies  Social History   Socioeconomic History   Marital status: Single    Spouse name: Not on file   Number of children: Not on file   Years of education: Not on file   Highest education level: Not on file  Occupational History   Not on file  Tobacco Use   Smoking status: Former    Packs/day: 0.20    Pack years: 0.00    Types: Cigarettes    Quit date: 10/05/2017    Years since quitting: 3.3   Smokeless tobacco: Never   Tobacco comments:    Quit 3 years ago  Vaping Use   Vaping Use: Never used  Substance and Sexual Activity   Alcohol use: No   Drug use: No    Comment: Quit   Sexual activity: Not Currently    Birth control/protection: Condom  Other Topics Concern   Not on file  Social History Narrative   ** Merged History Encounter **       Social Determinants of Health   Financial Resource Strain: Not on file  Food Insecurity: Not on file  Transportation Needs: Not on file  Physical Activity: Not on file  Stress: Not on file  Social Connections: Not on file  Intimate Partner Violence: Not on file   Family:    Vitals BP 119/87   Pulse (!) 103   Temp 98.8 F (37.1 C) (Oral)   Resp (!) 24   SpO2 99%      Physical Exam Constitutional: On nasal cannula, appears to be comfortable    Comments:   Cardiovascular:     Rate and Rhythm: Normal rate and regular rhythm.     Heart sounds: Systolic and diastolic murmur  Pulmonary:     Effort: Pulmonary effort is normal.     Comments: Decreased bilateral air entry at the bases  Abdominal:     Palpations: Abdomen is soft.     Tenderness: Nontender and nondistended  Musculoskeletal:        General: No swelling or tenderness.  Right AKA  Skin:    Comments: No lesions or rashes, left arm AV graft-no issues  Neurological:     General: No focal deficit  present.   Psychiatric:        Mood and Affect: Mood normal.    Pertinent Microbiology Results for orders placed or performed during the hospital encounter of 01/23/21  Resp Panel by RT-PCR (Flu A&B, Covid) Nasopharyngeal Swab     Status: None   Collection Time: 01/23/21 11:19 AM   Specimen: Nasopharyngeal Swab; Nasopharyngeal(NP) swabs in vial transport medium  Result Value Ref Range Status   SARS Coronavirus 2 by RT PCR NEGATIVE NEGATIVE Final    Comment: (NOTE)  SARS-CoV-2 target nucleic acids are NOT DETECTED.  The SARS-CoV-2 RNA is generally detectable in upper respiratory specimens during the acute phase of infection. The lowest concentration of SARS-CoV-2 viral copies this assay can detect is 138 copies/mL. A negative result does not preclude SARS-Cov-2 infection and should not be used as the sole basis for treatment or other patient management decisions. A negative result may occur with  improper specimen collection/handling, submission of specimen other than nasopharyngeal swab, presence of viral mutation(s) within the areas targeted by this assay, and inadequate number of viral copies(<138 copies/mL). A negative result must be combined with clinical observations, patient history, and epidemiological information. The expected result is Negative.  Fact Sheet for Patients:  EntrepreneurPulse.com.au  Fact Sheet for Healthcare Providers:  IncredibleEmployment.be  This test is no t yet approved or cleared by the [place] FDA and  has been authorized for detection and/or diagnosis of SARS-CoV-2 by FDA under an Emergency Use Authorization (EUA). This EUA will remain  in effect (meaning this test can be used) for the duration of the COVID-19 declaration under Section 564(b)(1) of the Act, 21 U.S.C.section 360bbb-3(b)(1), unless the authorization is terminated  or revoked sooner.       Influenza A by PCR NEGATIVE NEGATIVE Final    Influenza B by PCR NEGATIVE NEGATIVE Final    Comment: (NOTE) The Xpert Xpress SARS-CoV-2/FLU/RSV plus assay is intended as an aid in the diagnosis of influenza from Nasopharyngeal swab specimens and should not be used as a sole basis for treatment. Nasal washings and aspirates are unacceptable for Xpert Xpress SARS-CoV-2/FLU/RSV testing.  Fact Sheet for Patients: EntrepreneurPulse.com.au  Fact Sheet for Healthcare Providers: IncredibleEmployment.be  This test is not yet approved or cleared by the [place] FDA and has been authorized for detection and/or diagnosis of SARS-CoV-2 by FDA under an Emergency Use Authorization (EUA). This EUA will remain in effect (meaning this test can be used) for the duration of the COVID-19 declaration under Section 564(b)(1) of the Act, 21 U.S.C. section 360bbb-3(b)(1), unless the authorization is terminated or revoked.  Performed at [facility], [place] [address] [place], [place] [address]       Pertinent Lab seen by me: CBC Latest Ref Rng & Units 01/23/2021 01/06/2021 01/05/2021  WBC 4.0 - 10.5 K/uL 17.5(H) 18.3(H) 31.0(H)  Hemoglobin 13.0 - 17.0 g/dL 9.4(L) 10.8(L) 10.8(L)  Hematocrit 39.0 - 52.0 % 33.8(L) 35.2(L) 35.0(L)  Platelets 150 - 400 K/uL 259 423(H) 456(H)   CMP Latest Ref Rng & Units 01/23/2021 01/05/2021 01/03/2021  Glucose 70 - 99 mg/dL 85 81 105(H)  BUN 6 - 20 mg/dL 25(H) 67(H) 62(H)  Creatinine 0.61 - 1.24 mg/dL 6.32(H) 8.03(H) 8.50(H)  Sodium 135 - 145 mmol/L 133(L) 128(L) 130(L)  Potassium 3.5 - 5.1 mmol/L 4.1 5.1 4.5  Chloride 98 - 111 mmol/L 91(L) 90(L) 91(L)  CO2 22 - 32 mmol/L '22 23 22  '$ Calcium 8.9 - 10.3 mg/dL 8.1(L) 9.2 9.2  Total Protein 6.5 - 8.1 g/dL 8.6(H) - -  Total Bilirubin 0.3 - 1.2 mg/dL 3.9(H) - -  Alkaline Phos 38 - 126 U/L 82 - -  AST 15 - 41 U/L 86(H) - -  ALT 0 - 44 U/L 32 - -    Pertinent Imagings/Other Imagings Plain films and CT images have been  personally visualized and interpreted; radiology reports have been reviewed. Decision making incorporated into the Impression / Recommendations.  CT Angio chest 01/23/21 FINDINGS: Cardiovascular: Heart is enlarged, particularly right heart. There  is reflux of contrast into the hepatic veins. Small pericardial effusion. Thoracic aortic vascular calcifications. Adequate opacification of the pulmonary arteries. Motion artifact limits evaluation of the distal pulmonary arteries. Within the above limitation, no evidence for acute pulmonary embolus.   Mediastinum/Nodes: No enlarged axillary, mediastinal or hilar lymphadenopathy. Normal appearance of the esophagus.   Lungs/Pleura: Central airways are patent. There are small bilateral pleural effusions. The right pleural effusion appears partially loculated. Bandlike patchy ground-glass and consolidative opacities throughout the lungs bilaterally.   Upper Abdomen: Unremarkable.   Musculoskeletal: Thoracic spine degenerative changes. There is osseous destruction of the superior endplate of the 624THL vertebral body. There is loss of disc space height at the T10-T11 level. This is most compatible with discitis osteomyelitis is demonstrated on prior thoracic spine MRI 12/25/2020.   Review of the MIP images confirms the above findings.   IMPRESSION: 1. No evidence for acute pulmonary embolus. 2. Bilateral pleural effusions. The right pleural effusion appears to be partially loculated. There are scattered bandlike consolidative opacities which may represent atelectasis and or infection. 3. T10-T11 discitis osteomyelitis as demonstrated on prior MRI of the thoracic spine 12/25/2020. 4. The right heart is enlarged. Secondary findings suggestive of right heart failure including reflux of contrast into the hepatic veins. 5. Aortic Atherosclerosis (ICD10-I70.0).  Chest Xray 01/23/21 FINDINGS: Cardiomegaly. Prior cardiac valve replacement.  Central pulmonary vascular congestion. Patchy bilateral airspace opacities. Redemonstrated architectural distortion within the right mid lung. Right greater than left pleural effusions, increased from the prior chest radiograph of 12/22/2020. The right pleural effusion may be loculated. No evidence of pneumothorax. No acute bony abnormality identified. A vascular stent projects in the left subclavicular region.   IMPRESSION: Cardiomegaly with pulmonary vascular congestion. Patchy bilateral pulmonary opacities have progressed as compared to the chest radiograph of 12/22/2020 and are favored to reflect pulmonary edema. Pneumonia considered less likely, although not excluded.   Progressive right greater than left pleural effusions. The right pleural effusion may be loculated.   Redemonstrated architectural distortion within the right mid lung. I have spent more than 70  minutes for this patient encounter including review of prior medical records with greater than 50% of time being face to face and coordination of their care.  Electronically signed by:   Rosiland Oz, MD Infectious Disease Physician [facility] Pager: [phone]

## 2021-01-23 NOTE — ED Notes (Signed)
Pt SpO2 to 88% RA with good waveform, pt placed on 3L [place]. SpO2 up to 94%.

## 2021-01-23 NOTE — ED Provider Notes (Signed)
[facility] EMERGENCY DEPARTMENT Provider Note   CSN: [id number] Arrival date & time: 01/23/21  [id number]     History Chief Complaint  Patient presents with   Chest Pain    Alastor Tivnan is a 50 y.o. male.   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  He is currently living in a nursing facility, he has not missed any dialysis, states that he was in his usual state of health but he was awake in bed sitting up around 3:00 in the morning when he developed gradual onset of a left-sided chest discomfort.  He denies to me that there was any radiation of this pain, it was associated with some shortness of breath, the pain has now completely resolved and is gone.  It lasted for multiple hours, he was given multiple different interventions including aspirin, Mylanta and nitroglycerin.  It was not until the third dose of nitroglycerin that he felt like his pain eased off.  He has no swelling, he denies nausea or vomiting, he states that he has had this pain multiple times in the past, he is unsure what it was but figured that it was acid reflux.  In fact he states that his prior caregivers have told him that he had acid reflux that was causing this and it usually seem to get better with acid reflux medication.  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  He completed treatment with gentamicin and is currently on vancomycin and rifampin.  CT surgery evaluated the patient in May stating that he was not a candidate for redo of the mitral  valve or aortic valve.  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 Sharol Given approximately 3 months ago.  He is currently in the skilled nursing facility working on therapy  The patient underwent coronary angiogram with a left heart catheterization on October 07, 2020 by Dr. Gwenlyn Found, during this evaluation it was found that he had clean coronary arteries.  Past Medical History:  Diagnosis Date   Bacterial endocarditis 09/27/2020   MRSA   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 09/29/2020   Patient needs f/u CT chest 3 months   Chronic kidney disease    patient on dialysis tues-thurs-sat   Cirrhosis of liver ([place])    Drug addiction ([place])    ETOH abuse    Hepatitis    Hypertension    Mitral regurgitation    Patent foramen ovale 10/23/2019   Renal disorder renal failure   S/P minimally-invasive mitral valve replacement with bioprosthetic valve 10/14/2020   31 mm Medtronic Mosaic stented porcine bioprosthetic tissue valve via right mini thoracotomy approach   Sleep apnea    Systolic and diastolic CHF, 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[place]) 04/19/2013    Patient Active Problem List   Diagnosis Date Noted   Acute bacterial endocarditis 01/21/2021   SIRS (systemic inflammatory response syndrome) ([place]) 12/22/2020  Moderate protein-calorie malnutrition ([place]) 11/11/2020   Other acute osteomyelitis, right tibia and fibula ([place]) 11/11/2020   S/P AKA (above knee amputation), right (HCC)    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)    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)    Abscess of right leg    S/P mitral valve replacement    S/P mitral valve repair    S/P minimally-invasive mitral valve replacement with bioprosthetic valve 10/14/2020   Dental caries    Chronic apical periodontitis     Chronic periodontitis    Accretions on teeth    Mitral regurgitation    Uncontrolled hypertension    Patent foramen ovale    Cerebral embolism with cerebral infarction 09/29/2020   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 09/29/2020   Bacteremia due to Staphylococcus aureus    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   Lactic acidosis 09/27/2020   Bacterial endocarditis 09/27/2020   Allergy, unspecified, subsequent encounter 04/15/2020   Anaphylactic shock, unspecified, subsequent encounter 0000000   Complication of vascular dialysis catheter 10/31/2019   Kidney disease 02/04/2019   Personal history of Methicillin resistant Staphylococcus aureus infection 09/11/2018   Encounter for immunization 09/07/2017   Dependence on renal dialysis ([place]) 03/07/2017   Iron deficiency anemia, unspecified 11/15/2015   Coagulation defect, unspecified ([place]) 10/08/2015   Gastrointestinal hemorrhage, unspecified 10/08/2015   Pain, unspecified 10/08/2015   Secondary hyperparathyroidism of renal origin ([place]) 10/08/2015   End stage renal disease ([place]) 10/17/2013   Anemia in chronic kidney disease 08/30/2013   Hepatitis C 08/30/2013   ESRD on dialysis ([place]) 08/30/2013   Hypertensive crisis 08/28/2013   Hypertensive emergency 04/20/2013   Chronic combined systolic and diastolic congestive heart failure ([place]) 04/19/2013   HTN (hypertension) 04/19/2013    Past Surgical History:  Procedure Laterality Date   AMPUTATION Right 10/29/2020   Procedure: RIGHT ABOVE KNEE AMPUTATION;  Surgeon: [name], MD;  Location: [place];  Service: Orthopedics;  Laterality: Right;   AV FISTULA PLACEMENT Right    AV FISTULA PLACEMENT Left 09/03/2013   Procedure: ARTERIOVENOUS (AV) FISTULA CREATION - LEFT BRACHIOCEPHALIC;  Surgeon: Conrad , MD;  Location: [place];  Service: Vascular;  Laterality: Left;   AV FISTULA PLACEMENT Left 10/30/2019   Procedure: INSERTION OF ARTERIOVENOUS (AV) GORE-TEX  GRAFT ARM;  Surgeon: Rosetta Posner, MD;  Location: [place];  Service: Vascular;  Laterality: Left;   BUBBLE STUDY  10/05/2020   Procedure: BUBBLE STUDY;  Surgeon: Pixie Casino, MD;  Location: [place];  Service: Cardiovascular;;   I & D EXTREMITY Right 10/20/2020   Procedure: IRRIGATION AND DEBRIDEMENT OF LEG;  Surgeon: [name], MD;  Location: [place];  Service: Orthopedics;  Laterality: Right;   INSERTION OF DIALYSIS CATHETER N/A 08/29/2013   Procedure: INSERTION OF DIALYSIS CATHETER;  Surgeon: Mal Misty, MD;  Location: [place];  Service: Vascular;  Laterality: N/A;   INSERTION OF DIALYSIS CATHETER Left 10/30/2019   Procedure: INSERTION OF DIALYSIS CATHETER LEFT INTERNAL JUGULAR;  Surgeon: Rosetta Posner, MD;  Location: [place];  Service: Vascular;  Laterality: Left;   LEFT HEART CATH AND CORONARY ANGIOGRAPHY N/A 10/07/2020   Procedure: LEFT HEART CATH AND CORONARY ANGIOGRAPHY;  Surgeon: Lorretta Harp, MD;  Location: [place] [facility];  Service: Cardiovascular;  Laterality: N/A;   MITRAL VALVE REPAIR Right 10/14/2020   Procedure: MINIMALLY INVASIVE  MITRAL VALVE  REPLACEMENT(MVR) USING MEDTRONIC MOSAIC VALVE SIZE 31MM;  Surgeon: Rexene Alberts, MD;  Location: [place];  Service: Open Heart Surgery;  Laterality: Right;   MULTIPLE EXTRACTIONS WITH ALVEOLOPLASTY N/A 10/12/2020   Procedure: MULTIPLE EXTRACTION WITH ALVEOLOPLASTY;  Surgeon: [name], DMD;  Location: [place];  Service: Dentistry;  Laterality: N/A;   PERIPHERAL VASCULAR CATHETERIZATION N/A 01/06/2015   Procedure: A/V Shuntogram/Fistulagram;  Surgeon: Katha Cabal, MD;  Location: [place] [facility];  Service: Cardiovascular;  Laterality: N/A;   PERIPHERAL VASCULAR CATHETERIZATION Left 01/06/2015   Procedure: A/V Shunt Intervention;  Surgeon: Katha Cabal, MD;  Location: [place] [facility];  Service: Cardiovascular;  Laterality: Left;   REPAIR OF PATENT FORAMEN OVALE N/A 10/14/2020   Procedure: CLOSURE OF PATENT  FORAMEN OVALE;  Surgeon: Rexene Alberts, MD;  Location: [place];  Service: Open Heart Surgery;  Laterality: N/A;   TEE WITHOUT CARDIOVERSION N/A 10/05/2020   Procedure: TRANSESOPHAGEAL ECHOCARDIOGRAM (TEE);  Surgeon: Pixie Casino, MD;  Location: [place];  Service: Cardiovascular;  Laterality: N/A;   TEE WITHOUT CARDIOVERSION N/A 10/14/2020   Procedure: TRANSESOPHAGEAL ECHOCARDIOGRAM (TEE);  Surgeon: Rexene Alberts, MD;  Location: [place];  Service: Open Heart Surgery;  Laterality: N/A;   TEE WITHOUT CARDIOVERSION N/A 12/24/2020   Procedure: TRANSESOPHAGEAL ECHOCARDIOGRAM (TEE);  Surgeon: Pixie Casino, MD;  Location: [facility];  Service: Cardiovascular;  Laterality: N/A;       Family History  Problem Relation Age of Onset   Hypertension Mother    Cancer Mother        breast    Social History   Tobacco Use   Smoking status: Former    Packs/day: 0.20    Pack years: 0.00    Types: Cigarettes    Quit date: 10/05/2017    Years since quitting: 3.3   Smokeless tobacco: Never   Tobacco comments:    Quit 3 years ago  Vaping Use   Vaping Use: Never used  Substance Use Topics   Alcohol use: No   Drug use: No    Comment: Quit    Home Medications Prior to Admission medications   Medication Sig Start Date End Date Taking? Authorizing Provider  acetaminophen (TYLENOL) 325 MG tablet Take 650 mg by mouth every 6 (six) hours as needed for mild pain. Temp > 100.5    [provider]  aspirin EC 325 MG EC tablet Take 1 tablet (325 mg total) by mouth daily. 11/10/20   Samella Parr, NP  bisacodyl (DULCOLAX) 10 MG suppository Place 1 suppository (10 mg total) rectally daily as needed for moderate constipation. 11/09/20   Samella Parr, NP  cinacalcet (SENSIPAR) 30 MG tablet Take 2 tablets (60 mg total) by mouth daily with breakfast. 11/10/20   Samella Parr, NP  Darbepoetin Alfa (ARANESP, ALBUMIN FREE, IJ) Darbepoetin Alfa (Aranesp) 07/05/20 07/04/21  [provider]  diltiazem (CARDIZEM CD) 120 MG 24 hr capsule Take 1 capsule (120 mg total) by mouth daily. 01/07/21   Ghimire, Henreitta Leber, MD  docusate sodium (COLACE) 100 MG capsule Take 1 capsule (100 mg total) by mouth 2 (two) times daily. 11/09/20   Samella Parr, NP  methocarbamol (ROBAXIN) 500 MG tablet Take 1 tablet (500 mg total) by mouth 3 (three) times daily. 11/09/20   Samella Parr, NP  metoprolol tartrate (LOPRESSOR) 50 MG tablet Take 0.5 tablets (25 mg total) by mouth 2 (two) times daily. 01/06/21  Ghimire, Henreitta Leber, MD  Multiple Vitamin (MULTIVITAMIN WITH MINERALS) TABS tablet Take 1 tablet by mouth daily.    [provider]  nitroGLYCERIN (NITROSTAT) 0.4 MG SL tablet Place 0.4 mg under the tongue every 5 (five) minutes x 3 doses as needed for chest pain.     [provider]  Nutritional Supplements (,FEEDING SUPPLEMENT, PROSOURCE PLUS) liquid Take 30 mLs by mouth 2 (two) times daily between meals. 11/09/20   Samella Parr, NP  Nutritional Supplements (FEEDING SUPPLEMENT, NEPRO CARB STEADY,) LIQD Take 237 mLs by mouth 3 (three) times daily between meals. 11/09/20   Samella Parr, NP  oxyCODONE (OXYCONTIN) 15 mg 12 hr tablet Take 1 tablet (15 mg total) by mouth every 12 (twelve) hours. 01/06/21   Ghimire, Henreitta Leber, MD  pantoprazole (PROTONIX) 40 MG tablet Take 1 tablet (40 mg total) by mouth daily. 11/10/20   Samella Parr, NP  polyethylene glycol (MIRALAX / GLYCOLAX) 17 g packet Take 17 g by mouth daily as needed for mild constipation. 11/09/20   Samella Parr, NP  pregabalin (LYRICA) 75 MG capsule Take 1 capsule (75 mg total) by mouth at bedtime. 11/09/20   Samella Parr, NP  rifampin (RIFADIN) 300 MG capsule Take 1 capsule (300 mg total) by mouth every 8 (eight) hours. 6 weeks from 5/27 01/06/21   Jonetta Osgood, MD  senna-docusate (SENOKOT-S) 8.6-50 MG tablet Take 1 tablet by mouth 2 (two) times daily. 11/09/20   Samella Parr, NP  sevelamer carbonate (RENVELA) 800 MG  tablet Take 1,600 mg by mouth 3 (three) times daily with meals.    [provider]  Vancomycin (VANCOCIN) 750-5 MG/150ML-% SOLN Inject 150 mLs (750 mg total) into the vein every Monday, Wednesday, and Friday with hemodialysis. 6 weeks of vancomycin from 5/27 12/31/20   Ghimire, Henreitta Leber, MD    Allergies    Patient has no known allergies.  Review of Systems   Review of Systems  Cardiovascular:  Positive for chest pain.  All other systems reviewed and are negative.  Physical Exam Updated Vital Signs There were no vitals taken for this visit.  Physical Exam Vitals and nursing note reviewed.  Constitutional:      General: He is not in acute distress.    Appearance: He is well-developed.  HENT:     Head: Normocephalic and atraumatic.     Mouth/Throat:     Pharynx: No oropharyngeal exudate.  Eyes:     General: No scleral icterus.       Right eye: No discharge.        Left eye: No discharge.     Conjunctiva/sclera: Conjunctivae normal.     Pupils: Pupils are equal, round, and reactive to light.  Neck:     Thyroid: No thyromegaly.     Vascular: No JVD.  Cardiovascular:     Rate and Rhythm: Regular rhythm. Tachycardia present.     Heart sounds: Murmur heard.    No friction rub. No gallop.  Pulmonary:     Effort: Pulmonary effort is normal. No respiratory distress.     Breath sounds: Normal breath sounds. No wheezing or rales.  Abdominal:     General: Bowel sounds are normal. There is no distension.     Palpations: Abdomen is soft. There is no mass.     Tenderness: There is no abdominal tenderness.  Musculoskeletal:        General: No tenderness. Normal range of motion.  Cervical back: Normal range of motion and neck supple.     Left lower leg: No edema.     Comments: Right AKA  Lymphadenopathy:     Cervical: No cervical adenopathy.  Skin:    General: Skin is warm and dry.     Findings: No erythema or rash.  Neurological:     Mental Status: He is alert.      Coordination: Coordination normal.     Comments: Patient is awake alert and able answer my questions  Psychiatric:        Behavior: Behavior normal.    ED Results / Procedures / Treatments   Labs (all labs ordered are listed, but only abnormal results are displayed) Labs Reviewed - No data to display  EKG EKG Interpretation  Date/Time:  Sunday January 23 2021 08:56:45 EDT Ventricular Rate:  110 PR Interval:  148 QRS Duration: 84 QT Interval:  357 QTC Calculation: 483 R Axis:   11 Text Interpretation: Sinus or ectopic atrial tachycardia Abnormal T, consider ischemia, lateral leads prolonged QT. Since last tracing rate continues to be tachycardic, no other significant findings. Confirmed by Noemi Chapel [phone]) on 01/23/2021 9:34:18 AM  Radiology DG Chest Portable 1 View  Result Date: 01/23/2021 CLINICAL DATA:  Chest pain. EXAM: PORTABLE CHEST 1 VIEW COMPARISON:  Prior chest radiographs 12/22/2020 and earlier. FINDINGS: Cardiomegaly. Prior cardiac valve replacement. Central pulmonary vascular congestion. Patchy bilateral airspace opacities. Redemonstrated architectural distortion within the right mid lung. Right greater than left pleural effusions, increased from the prior chest radiograph of 12/22/2020. The right pleural effusion may be loculated. No evidence of pneumothorax. No acute bony abnormality identified. A vascular stent projects in the left subclavicular region. IMPRESSION: Cardiomegaly with pulmonary vascular congestion. Patchy bilateral pulmonary opacities have progressed as compared to the chest radiograph of 12/22/2020 and are favored to reflect pulmonary edema. Pneumonia considered less likely, although not excluded. Progressive right greater than left pleural effusions. The right pleural effusion may be loculated. Redemonstrated architectural distortion within the right mid lung. Electronically Signed   By: Kellie Simmering DO   On: 01/23/2021 10:26    Procedures .Critical  Care  Date/Time: 01/23/2021 12:36 PM Performed by: Noemi Chapel, MD Authorized by: Noemi Chapel, MD   Critical care provider statement:    Critical care time (minutes):  35   Critical care time was exclusive of:  Separately billable procedures and treating other patients and teaching time   Critical care was necessary to treat or prevent imminent or life-threatening deterioration of the following conditions:  Cardiac failure   Critical care was time spent personally by me on the following activities:  Blood draw for specimens, development of treatment plan with patient or surrogate, discussions with consultants, evaluation of patient's response to treatment, examination of patient, obtaining history from patient or surrogate, ordering and performing treatments and interventions, ordering and review of laboratory studies, ordering and review of radiographic studies, pulse oximetry, re-evaluation of patient's condition and review of old charts   Medications Ordered in ED Medications - No data to display  ED Course  I have reviewed the triage vital signs and the nursing notes.  Pertinent labs & imaging results that were available during my care of the patient were reviewed by me and considered in my medical decision making (see chart for details).  Clinical Course as of 01/23/21 1236  Sun Jan 23, 2021  1058 Trop elevated at > 1000, paged cardiology to get recommendations - has leukocytosis and elevated lactic,  has normal K, VS are better with pulse of 103 and pressure of 137/90.  Holding heparin until getting cardiology recommendations [BM]    Clinical Course User Index [BM] Noemi Chapel, MD   MDM Rules/Calculators/A&P                          Unfortunately this patient is tachycardic, he has a slight hypoxia to 94% but has a history of multiple known septic emboli throughout his body from brain, lungs, bones and an ongoing murmur.  He has been on multiple antibiotics currently on  vancomycin and rifampin, his EKG does not show any signs of acute ischemia and thankfully his coronaries were clean in March suggesting that the chest pain is coming from another source.  At this time I think it is reasonable to initiate another septic work-up as the patient could very well have an early sepsis, more pulmonary septic emboli, he has high risk for severe illness  Pt is critically ill, has an elevated troponin, is tachypneic, I am concerned that this patient because of severe illness is going to need to be admitted to the hospital.  It is unclear what the exact cause of this patient's symptoms are however due to his level of illness he will need to be admitted.  I discussed the care with Dr. Lorin Mercy of the hospitalist service who has been kind enough to admit.  There is a leukocytosis, antibiotics were added, Cardiology requests holding heparin until they see the patient in consultation.  Final Clinical Impression(s) / ED Diagnoses Final diagnoses:  NSTEMI (non-ST elevated myocardial infarction) ([place])  SIRS (systemic inflammatory response syndrome) (HCC)     Noemi Chapel, MD 01/23/21 1239

## 2021-01-24 ENCOUNTER — Ambulatory Visit: Payer: Medicaid Other | Admitting: Thoracic Surgery (Cardiothoracic Vascular Surgery)

## 2021-01-24 ENCOUNTER — Inpatient Hospital Stay (HOSPITAL_COMMUNITY): Payer: Medicaid Other

## 2021-01-24 ENCOUNTER — Other Ambulatory Visit: Payer: Medicaid Other

## 2021-01-24 ENCOUNTER — Encounter (HOSPITAL_COMMUNITY): Payer: Self-pay | Admitting: Internal Medicine

## 2021-01-24 ENCOUNTER — Ambulatory Visit: Payer: Self-pay

## 2021-01-24 DIAGNOSIS — I5032 Chronic diastolic (congestive) heart failure: Secondary | ICD-10-CM | POA: Diagnosis not present

## 2021-01-24 DIAGNOSIS — I330 Acute and subacute infective endocarditis: Secondary | ICD-10-CM | POA: Diagnosis not present

## 2021-01-24 DIAGNOSIS — E8779 Other fluid overload: Secondary | ICD-10-CM | POA: Diagnosis not present

## 2021-01-24 DIAGNOSIS — T82897A Other specified complication of cardiac prosthetic devices, implants and grafts, initial encounter: Secondary | ICD-10-CM | POA: Diagnosis not present

## 2021-01-24 DIAGNOSIS — R6510 Systemic inflammatory response syndrome (SIRS) of non-infectious origin without acute organ dysfunction: Secondary | ICD-10-CM

## 2021-01-24 DIAGNOSIS — A419 Sepsis, unspecified organism: Secondary | ICD-10-CM | POA: Diagnosis not present

## 2021-01-24 DIAGNOSIS — I38 Endocarditis, valve unspecified: Secondary | ICD-10-CM | POA: Diagnosis not present

## 2021-01-24 DIAGNOSIS — Z515 Encounter for palliative care: Secondary | ICD-10-CM

## 2021-01-24 DIAGNOSIS — R778 Other specified abnormalities of plasma proteins: Secondary | ICD-10-CM | POA: Diagnosis not present

## 2021-01-24 DIAGNOSIS — Z952 Presence of prosthetic heart valve: Secondary | ICD-10-CM

## 2021-01-24 DIAGNOSIS — I361 Nonrheumatic tricuspid (valve) insufficiency: Secondary | ICD-10-CM

## 2021-01-24 DIAGNOSIS — I132 Hypertensive heart and chronic kidney disease with heart failure and with stage 5 chronic kidney disease, or end stage renal disease: Secondary | ICD-10-CM | POA: Diagnosis not present

## 2021-01-24 DIAGNOSIS — R079 Chest pain, unspecified: Secondary | ICD-10-CM | POA: Diagnosis not present

## 2021-01-24 DIAGNOSIS — Z8679 Personal history of other diseases of the circulatory system: Secondary | ICD-10-CM | POA: Diagnosis not present

## 2021-01-24 DIAGNOSIS — Z89611 Acquired absence of right leg above knee: Secondary | ICD-10-CM

## 2021-01-24 DIAGNOSIS — Y712 Prosthetic and other implants, materials and accessory cardiovascular devices associated with adverse incidents: Secondary | ICD-10-CM | POA: Diagnosis not present

## 2021-01-24 DIAGNOSIS — E872 Acidosis: Secondary | ICD-10-CM | POA: Diagnosis not present

## 2021-01-24 DIAGNOSIS — N186 End stage renal disease: Secondary | ICD-10-CM | POA: Diagnosis not present

## 2021-01-24 DIAGNOSIS — I081 Rheumatic disorders of both mitral and tricuspid valves: Secondary | ICD-10-CM | POA: Diagnosis not present

## 2021-01-24 DIAGNOSIS — T826XXD Infection and inflammatory reaction due to cardiac valve prosthesis, subsequent encounter: Secondary | ICD-10-CM | POA: Diagnosis not present

## 2021-01-24 DIAGNOSIS — R0600 Dyspnea, unspecified: Secondary | ICD-10-CM | POA: Diagnosis not present

## 2021-01-24 DIAGNOSIS — D631 Anemia in chronic kidney disease: Secondary | ICD-10-CM | POA: Diagnosis not present

## 2021-01-24 DIAGNOSIS — Z992 Dependence on renal dialysis: Secondary | ICD-10-CM | POA: Diagnosis not present

## 2021-01-24 DIAGNOSIS — Z7189 Other specified counseling: Secondary | ICD-10-CM | POA: Diagnosis not present

## 2021-01-24 LAB — BASIC METABOLIC PANEL
Anion gap: 16 — ABNORMAL HIGH (ref 5–15)
BUN: 15 mg/dL (ref 6–20)
CO2: 24 mmol/L (ref 22–32)
Calcium: 7.8 mg/dL — ABNORMAL LOW (ref 8.9–10.3)
Chloride: 93 mmol/L — ABNORMAL LOW (ref 98–111)
Creatinine, Ser: 4.4 mg/dL — ABNORMAL HIGH (ref 0.61–1.24)
GFR, Estimated: 16 mL/min — ABNORMAL LOW (ref >60)
Glucose, Bld: 102 mg/dL — ABNORMAL HIGH (ref 70–99)
Potassium: 3.9 mmol/L (ref 3.5–5.1)
Sodium: 133 mmol/L — ABNORMAL LOW (ref 135–145)

## 2021-01-24 LAB — CBC
HCT: 33.4 % — ABNORMAL LOW (ref 39.0–52.0)
Hemoglobin: 9.6 g/dL — ABNORMAL LOW (ref 13.0–17.0)
MCH: 25.2 pg — ABNORMAL LOW (ref 26.0–34.0)
MCHC: 28.7 g/dL — ABNORMAL LOW (ref 30.0–36.0)
MCV: 87.7 fL (ref 80.0–100.0)
Platelets: 229 10*3/uL (ref 150–400)
RBC: 3.81 MIL/uL — ABNORMAL LOW (ref 4.22–5.81)
RDW: 25.1 % — ABNORMAL HIGH (ref 11.5–15.5)
WBC: 19.7 10*3/uL — ABNORMAL HIGH (ref 4.0–10.5)
nRBC: 0.5 % — ABNORMAL HIGH (ref 0.0–0.2)

## 2021-01-24 LAB — ECHOCARDIOGRAM COMPLETE
AR max vel: 1.45 cm2
AV Area VTI: 1.61 cm2
AV Area mean vel: 1.38 cm2
AV Mean grad: 3 mmHg
AV Peak grad: 7.7 mmHg
Ao pk vel: 1.39 m/s
Height: 72 in
MV VTI: 0.59 cm2
S' Lateral: 2.8 cm
Weight: 2310.42 oz

## 2021-01-24 LAB — VANCOMYCIN, RANDOM: Vancomycin Rm: 14

## 2021-01-24 LAB — TROPONIN I (HIGH SENSITIVITY): Troponin I (High Sensitivity): 1656 ng/L (ref <18)

## 2021-01-24 LAB — PROCALCITONIN: Procalcitonin: 1.5 ng/mL

## 2021-01-24 LAB — LACTIC ACID, PLASMA: Lactic Acid, Venous: 4.2 mmol/L (ref 0.5–1.9)

## 2021-01-24 MED ORDER — ATORVASTATIN CALCIUM 40 MG PO TABS
40.0000 mg | ORAL_TABLET | Freq: Every day | ORAL | Status: DC
  Administered 2021-01-24 – 2021-01-29 (×6): 40 mg via ORAL
  Filled 2021-01-24 (×6): qty 1

## 2021-01-24 MED ORDER — VANCOMYCIN HCL 750 MG/150ML IV SOLN
750.0000 mg | INTRAVENOUS | Status: AC
  Administered 2021-01-24: 750 mg via INTRAVENOUS
  Filled 2021-01-24: qty 150

## 2021-01-24 MED ORDER — VANCOMYCIN VARIABLE DOSE PER UNSTABLE RENAL FUNCTION (PHARMACIST DOSING): Status: DC

## 2021-01-24 NOTE — Progress Notes (Signed)
Pharmacy Antibiotic Note  Patrick Anthony is a 50 y.o. male admitted on 01/23/2021 with sepsis, hx of MRSA bacteremia with prosthetic valve endocarditis on vancomycin + rifampin per ID planned through 7/15, completed gent tx.  Pharmacy has been consulted for vancomycin dosing.  ESRD-HD usually MWF.   The patient received an extra HD session last night and the ordered Vancomcyin dose was not administered. VR this AM was low at 14 mcg/ml - will supplement now and follow-up plans for HD schedule for subsequent doses.   Plan: - Vancomycin 750 mg IV x 1 now - No standing Vancomycin for now - will follow-up with renal on plans for HD  Height: 6' (182.9 cm) Weight: 65.5 kg (144 lb 6.4 oz) IBW/kg (Calculated) : 77.6  Temp (24hrs), Avg:98.5 F (36.9 C), Min:98 F (36.7 C), Max:98.8 F (37.1 C)  Recent Labs  Lab 01/23/21 0901 01/23/21 0920 01/23/21 1119 01/24/21 0416  WBC  --  17.5*  --  19.7*  CREATININE  --  6.32*  --  4.40*  LATICACIDVEN 4.8*  --  4.4* 4.2*  VANCORANDOM  --   --   --  14     Estimated Creatinine Clearance: 18.8 mL/min (A) (by C-G formula based on SCr of 4.4 mg/dL (H)).    No Known Allergies  Thank you for allowing pharmacy to be a part of this patient's care.  Alycia Rossetti, PharmD, BCPS Clinical Pharmacist 01/24/2021 1:20 PM   **Pharmacist phone directory can now be found on [email or web address] ([email or web address]).  Listed under [place].

## 2021-01-24 NOTE — Progress Notes (Signed)
  Echocardiogram 2D Echocardiogram has been performed.  Patrick Anthony 01/24/2021, 12:08 PM

## 2021-01-24 NOTE — Progress Notes (Signed)
Cardiology Progress Note  Patient ID: Patrick Anthony MRN: [id number] DOB: [birth year] Date of Encounter: 01/24/2021  Primary Cardiologist: Fransico Him, MD  Subjective   Chief Complaint: None.  HPI: Chest pain improving.  Repeat echo pending.  Denies any symptoms.  ROS:  All other ROS reviewed and negative. Pertinent positives noted in the HPI.     Inpatient Medications  Scheduled Meds:  aspirin  325 mg Oral Daily   Chlorhexidine Gluconate Cloth  6 each Topical Q0600   cinacalcet  60 mg Oral Q breakfast   diltiazem  120 mg Oral Daily   docusate sodium  100 mg Oral BID   heparin  5,000 Units Subcutaneous Q8H   methocarbamol  500 mg Oral TID   metoprolol tartrate  25 mg Oral BID   oxyCODONE  15 mg Oral Q12H   pantoprazole  40 mg Oral Daily   pregabalin  75 mg Oral QHS   rifampin  300 mg Oral Q8H   senna-docusate  1 tablet Oral BID   sevelamer carbonate  1,600 mg Oral TID WC   sodium chloride flush  3 mL Intravenous Q12H   Continuous Infusions:  vancomycin     PRN Meds: acetaminophen **OR** acetaminophen, bisacodyl, calcium carbonate (dosed in mg elemental calcium), camphor-menthol **AND** hydrOXYzine, docusate sodium, feeding supplement (NEPRO CARB STEADY), ondansetron **OR** ondansetron (ZOFRAN) IV, polyethylene glycol, sorbitol, zolpidem   Vital Signs   Vitals:   01/24/21 0105 01/24/21 0106 01/24/21 0143 01/24/21 0802  BP: 116/80  (!) 127/91 95/71  Pulse: 98 (!) 103 100 91  Resp: (!) 22  (!) 24 16  Temp:   98 F (36.7 C) (!) 97.4 F (36.3 C)  TempSrc:   Oral Oral  SpO2: 98% 96% 96% 95%  Weight:   65.5 kg   Height:   6' (1.829 m)     Intake/Output Summary (Last 24 hours) at 01/24/2021 0955 Last data filed at 01/23/2021 2332 Gross per 24 hour  Intake --  Output 3020 ml  Net -3020 ml   Last 3 Weights 01/24/2021 01/21/2021 01/05/2021  Weight (lbs) 144 lb 6.4 oz 149 lb 147 lb 0.8 oz  Weight (kg) 65.5 kg 67.586 kg 66.7 kg      Telemetry  Overnight telemetry  shows sinus rhythm in the 80s, which I personally reviewed.   ECG  The most recent ECG shows sinus tachycardia heart rate 110, nonspecific ST-T changes, which I personally reviewed.   Physical Exam   Vitals:   01/24/21 0105 01/24/21 0106 01/24/21 0143 01/24/21 0802  BP: 116/80  (!) 127/91 95/71  Pulse: 98 (!) 103 100 91  Resp: (!) 22  (!) 24 16  Temp:   98 F (36.7 C) (!) 97.4 F (36.3 C)  TempSrc:   Oral Oral  SpO2: 98% 96% 96% 95%  Weight:   65.5 kg   Height:   6' (1.829 m)     Intake/Output Summary (Last 24 hours) at 01/24/2021 0955 Last data filed at 01/23/2021 2332 Gross per 24 hour  Intake --  Output 3020 ml  Net -3020 ml    Last 3 Weights 01/24/2021 01/21/2021 01/05/2021  Weight (lbs) 144 lb 6.4 oz 149 lb 147 lb 0.8 oz  Weight (kg) 65.5 kg 67.586 kg 66.7 kg    Body mass index is 19.58 kg/m.   General: Well nourished, well developed, in no acute distress Head: Atraumatic, normal size  Eyes: PEERLA, EOMI  Neck: Supple, no JVD Endocrine: No thryomegaly Cardiac: Normal S1, S2;  RRR; 3 out of 6 holosystolic murmur Lungs: Clear to auscultation bilaterally, no wheezing, rhonchi or rales  Abd: Soft, nontender, no hepatomegaly  Ext: No edema, pulses 2+, right AKA, left AV fistula in the upper extremity Musculoskeletal: No deformities, BUE and BLE strength normal and equal Skin: Warm and dry, no rashes   Neuro: Alert and oriented to person, place, time, and situation, CNII-XII grossly intact, no focal deficits  Psych: Normal mood and affect   Labs  High Sensitivity Troponin:   Recent Labs  Lab 12/26/20 1019 12/26/20 1214 01/23/21 0920 01/23/21 1119 01/24/21 0416  TROPONINIHS 613* 774* 1,110* 1,159* 1,656*     Cardiac EnzymesNo results for input(s): TROPONINI in the last 168 hours. No results for input(s): TROPIPOC in the last 168 hours.  Chemistry Recent Labs  Lab 01/23/21 0920 01/24/21 0416  NA 133* 133*  K 4.1 3.9  CL 91* 93*  CO2 22 24  GLUCOSE 85 102*   BUN 25* 15  CREATININE 6.32* 4.40*  CALCIUM 8.1* 7.8*  PROT 8.6*  --   ALBUMIN 2.3*  --   AST 86*  --   ALT 32  --   ALKPHOS 82  --   BILITOT 3.9*  --   GFRNONAA 10* 16*  ANIONGAP 20* 16*    Hematology Recent Labs  Lab 01/23/21 0920 01/24/21 0416  WBC 17.5* 19.7*  RBC 3.69* 3.81*  HGB 9.4* 9.6*  HCT 33.8* 33.4*  MCV 91.6 87.7  MCH 25.5* 25.2*  MCHC 27.8* 28.7*  RDW 25.4* 25.1*  PLT 259 229   BNPNo results for input(s): BNP, PROBNP in the last 168 hours.  DDimer No results for input(s): DDIMER in the last 168 hours.   Radiology  CT Angio Chest PE W and/or Wo Contrast  Result Date: 01/23/2021 CLINICAL DATA:  Patient with chest pain. EXAM: CT ANGIOGRAPHY CHEST WITH CONTRAST TECHNIQUE: Multidetector CT imaging of the chest was performed using the standard protocol during bolus administration of intravenous contrast. Multiplanar CT image reconstructions and MIPs were obtained to evaluate the vascular anatomy. CONTRAST:  23m OMNIPAQUE IOHEXOL 350 MG/ML SOLN COMPARISON:  CT chest 09/29/2020. FINDINGS: Cardiovascular: Heart is enlarged, particularly right heart. There is reflux of contrast into the hepatic veins. Small pericardial effusion. Thoracic aortic vascular calcifications. Adequate opacification of the pulmonary arteries. Motion artifact limits evaluation of the distal pulmonary arteries. Within the above limitation, no evidence for acute pulmonary embolus. Mediastinum/Nodes: No enlarged axillary, mediastinal or hilar lymphadenopathy. Normal appearance of the esophagus. Lungs/Pleura: Central airways are patent. There are small bilateral pleural effusions. The right pleural effusion appears partially loculated. Bandlike patchy ground-glass and consolidative opacities throughout the lungs bilaterally. Upper Abdomen: Unremarkable. Musculoskeletal: Thoracic spine degenerative changes. There is osseous destruction of the superior endplate of the [id number] body. There is loss of  disc space height at the T10-T11 level. This is most compatible with discitis osteomyelitis is demonstrated on prior thoracic spine MRI 12/25/2020. Review of the MIP images confirms the above findings. IMPRESSION: 1. No evidence for acute pulmonary embolus. 2. Bilateral pleural effusions. The right pleural effusion appears to be partially loculated. There are scattered bandlike consolidative opacities which may represent atelectasis and or infection. 3. T10-T11 discitis osteomyelitis as demonstrated on prior MRI of the thoracic spine 12/25/2020. 4. The right heart is enlarged. Secondary findings suggestive of right heart failure including reflux of contrast into the hepatic veins. 5. Aortic Atherosclerosis (ICD10-I70.0). Electronically Signed   By: DLovey NewcomerM.D.   On: 01/23/2021  12:45   DG Chest Portable 1 View  Result Date: 01/23/2021 CLINICAL DATA:  Chest pain. EXAM: PORTABLE CHEST 1 VIEW COMPARISON:  Prior chest radiographs 12/22/2020 and earlier. FINDINGS: Cardiomegaly. Prior cardiac valve replacement. Central pulmonary vascular congestion. Patchy bilateral airspace opacities. Redemonstrated architectural distortion within the right mid lung. Right greater than left pleural effusions, increased from the prior chest radiograph of 12/22/2020. The right pleural effusion may be loculated. No evidence of pneumothorax. No acute bony abnormality identified. A vascular stent projects in the left subclavicular region. IMPRESSION: Cardiomegaly with pulmonary vascular congestion. Patchy bilateral pulmonary opacities have progressed as compared to the chest radiograph of 12/22/2020 and are favored to reflect pulmonary edema. Pneumonia considered less likely, although not excluded. Progressive right greater than left pleural effusions. The right pleural effusion may be loculated. Redemonstrated architectural distortion within the right mid lung. Electronically Signed   By: Kellie Simmering DO   On: 01/23/2021 10:26     Cardiac Studies  TEE 12/24/2020  1. Left ventricular ejection fraction, by estimation, is 60 to 65%. The  left ventricle has normal function. The left ventricle has no regional  wall motion abnormalities. There is moderate left ventricular hypertrophy.   2. Right ventricular systolic function is normal. The right ventricular  size is severely enlarged. Mildly increased right ventricular wall  thickness.   3. No left atrial/left atrial appendage thrombus was detected.   4. Right atrial size was severely dilated.   5. The mitral valve has been repaired/replaced. Trivial mitral valve  regurgitation. There is a bioprosthetic valve present in the mitral  position. Echo findings are consistent with vegetation the mitral  prosthesis.   6. Aortic valve vegetation is visualized on the left.   7. The aortic valve is tricuspid. Aortic valve regurgitation is not  visualized.   8. There is mild (Grade II) plaque.   Patient Profile  Zuhayr Bedrosian is a 50 y.o. 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  Cardiology was consulted.  Assessment & Plan   Chest pain/elevated troponin/lactic acidosis -He is admitted with a rising white count and elevated lactic acid is not clearing.  His troponin is minimally elevated and flat in my opinion.  His EKG shows no ischemic changes or evidence of ST elevation. -He had a left heart catheterization prior to mitral valve surgery in March 2022.  This showed no significant CAD. -I highly suspect this is just demand in the setting of lactic acidosis and metabolic derangements.  This does not represent an acute coronary syndrome and I would not treat as such. -Other possibility could  be embolic phenomenon due to mitral prosthetic valve endocarditis.  Again limited options. -I have ordered a repeat echocardiogram to evaluate this further.  Unfortunately he is not a candidate for redo surgery.  He has limited options.  If his mitral valve endocarditis has progressed he will likely need palliative care discussion. -For now we will continue aspirin.  I have added a statin.  I will hold on heparin.  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  He would be high risk for bleeding.  I am not convinced this is a primary ACS event.  2.  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  Had  mitral valve endocarditis with significant instruction.  Underwent mitral valve replacement with a 31 mm Mosaic valve.  He had recurrent endocarditis of the prosthetic valve in May 2022.  He is not a candidate for redo surgery. -Course was also complicated by osteomyelitis and he is status post right AKA. -I highly suspect his lactic acidosis and rising white count are consistent with progression of his endocarditis.  This is also coincided with minimally elevated and flat troponins.  He has limited options for redo surgery.  We will obtain a repeat echocardiogram.  Again we may be headed toward palliative care if his mitral regurgitation is worse.  He does have a murmur suggestive of this.  Repeat echocardiogram will help [place] know this. -For now would continue antibiotics per infectious diseases.  3.  Atrial tachycardia versus atrial flutter -Heart rate will jump up into the 110 beats a minute range.  Will come back down.  I would like to repeat an EKG as it does show that he is in sinus rhythm on telemetry.  The rate does change.  I suspect this could just be an atrial tachycardia.  No indication for anticoagulation at this time. -Continue diltiazem.  For questions or updates, please contact [place] Please consult  [email or web address] for contact info under   Time Spent with Patient: I have spent a total of 25 minutes with patient reviewing hospital notes, telemetry, EKGs, labs and examining the patient as well as establishing an assessment and plan that was discussed with the patient.  > 50% of time was spent in direct patient care.    Signed, Addison Naegeli. Audie Box, MD, [place]  01/24/2021 9:55 AM

## 2021-01-24 NOTE — Progress Notes (Signed)
PROGRESS NOTE    Patrick Anthony  [id number] DOB: February 25, 1971 DOA: 01/23/2021 PCP: [name], MD     Brief Narrative:  Patrick Anthony is a 50 y.o. 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  He was recommended to have 6 weeks of Vanc/Rifampin treatment (through 7/8) and completed 2 weeks of gentamicin treatment.  CT surgery determined that he is not a candidate for redo valve replacement.  He reports since d/c he has noticed what "feels like heartburn" periodically - particularly at HD sessions.  He noticed it today and it lasted a long time so he decided to come in for evaluation.  He is noticing some SOB, including PND and periodic orthopnea.  No fever or cough.  Patient was admitted for chest pain, cardiology, infectious disease, nephrology also consulted.  New events last 24 hours / Subjective: Patient states that his "burning" chest pain has resolved for now.  No new complaints on examination today.  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  He is status post mitral valve replacement, right AKA. -Infectious disease following -Continue vancomycin, rifampin until 7/16 -Echocardiogram revealed rocking, dehiscence, stenosis and fracture/perforation the mitral prosthesis.  -CT surgery consulted -Palliative care consulted  Demand ischemia Chest pain -Echocardiogram as below  -Cardiology following, suspected to be demand ischemia in setting of underlying infectious process, metabolic derangement -Continue aspirin, statin  Atrial tachycardia  -Continue cardizem, lopressor   ESRD -HD MWF -Nephrology following  Cirrhosis with hepatitis C -Will need outpatient follow-up    DVT prophylaxis:  heparin injection 5,000 Units Start: 01/23/21 1400  Code Status:     Code Status Orders  (From admission, onward)           Start     Ordered   01/23/21 1333  Full code  Continuous        01/23/21 1336           Code Status History     Date Active Date Inactive Code Status Order ID Comments User Context   12/22/2020 1833 01/07/2021 0343 Full Code [id number]  Norval Morton, MD ED   09/28/2020 1042 11/11/2020 0303 Full Code [id number]  Oswald Hillock, MD Inpatient   09/27/2020 1808 09/28/2020 1042 Full Code [id number]  Allie Dimmer, MD ED   10/30/2019 1259 10/31/2019 1704 Full Code [id number]  Marval Regal Inpatient   04/20/2013 0234 04/24/2013 1650 Full Code [id number]  Ivor Costa, MD Inpatient      Family Communication: None at bedside  Disposition Plan:  Status is: Inpatient  Remains inpatient appropriate because:Ongoing diagnostic testing needed not  appropriate for outpatient work up and Inpatient level of care appropriate due to severity of illness  Dispo: The patient is from: Home              Anticipated d/c is to: Home              Patient currently is not medically stable to d/c.   Difficult to place patient No      Consultants:  Infectious  disease Cardiology Nephrology Cardiothoracic surgery Palliative care medicine  Antimicrobials:  Anti-infectives (From admission, onward)    Start     Dose/Rate Route Frequency Ordered Stop   01/24/21 1200  vancomycin (VANCOREADY) IVPB 750 mg/150 mL  Status:  Discontinued        750 mg 150 mL/hr over 60 Minutes Intravenous Every M-W-F (Hemodialysis) 01/23/21 1134 01/24/21 0733   01/24/21 0830  vancomycin (VANCOREADY) IVPB 750 mg/150 mL        750 mg 150 mL/hr over 60 Minutes Intravenous STAT 01/24/21 0734 01/24/21 0950   01/23/21 2115  vancomycin (VANCOREADY) IVPB 750 mg/150 mL  Status:  Discontinued        750 mg 150 mL/hr over 60 Minutes Intravenous  Once 01/23/21 2103 01/24/21 0733   01/23/21 1500  rifampin (RIFADIN) capsule 300 mg       Note to Pharmacy: 6 weeks from 5/27     300 mg Oral Every 8 hours 01/23/21 1336     01/23/21 1130  ceFEPIme (MAXIPIME) 2 g in sodium chloride 0.9 % 100 mL IVPB        2 g 200 mL/hr over 30 Minutes Intravenous  Once 01/23/21 1122 01/23/21 1305        Objective: Vitals:   01/24/21 0106 01/24/21 0143 01/24/21 0802 01/24/21 1153  BP:  (!) 127/91 95/71 118/90  Pulse: (!) 103 100 91 92  Resp:  (!) '24 16 16  '$ Temp:  98 F (36.7 C) (!) 97.4 F (36.3 C) (!) 97.4 F (36.3 C)  TempSrc:  Oral Oral Oral  SpO2: 96% 96% 95% 98%  Weight:  65.5 kg    Height:  6' (1.829 m)      Intake/Output Summary (Last 24 hours) at 01/24/2021 1307 Last data filed at 01/23/2021 2332 Gross per 24 hour  Intake --  Output 3020 ml  Net -3020 ml   Filed Weights   01/24/21 0143  Weight: 65.5 kg    Examination:  General exam: Appears calm and comfortable  Respiratory system: Clear to auscultation. Respiratory effort normal. No respiratory distress. No conversational dyspnea.  Cardiovascular system: S1 & S2 heard, RRR. + Murmur Gastrointestinal system: Abdomen is nondistended, soft and nontender. Normal bowel sounds heard. Central nervous system: Alert and  oriented. No focal neurological deficits. Speech clear.  Extremities: Right AKA Skin: No rashes, lesions or ulcers on exposed skin  Psychiatry: Judgement and insight appear normal. Mood & affect appropriate.   Data Reviewed: I have personally reviewed following labs and imaging studies  CBC: Recent Labs  Lab 01/23/21 0920 01/24/21 0416  WBC 17.5* 19.7*  NEUTROABS 14.6*  --   HGB 9.4* 9.6*  HCT 33.8* 33.4*  MCV 91.6 87.7  PLT 259 [id number]   Basic Metabolic Panel: Recent Labs  Lab 01/23/21 0920 01/24/21 0416  NA 133* 133*  K 4.1 3.9  CL 91* 93*  CO2 22 24  GLUCOSE 85 102*  BUN 25* 15  CREATININE 6.32* 4.40*  CALCIUM 8.1* 7.8*   GFR: Estimated Creatinine Clearance: 18.8 mL/min (  A) (by C-G formula based on SCr of 4.4 mg/dL (H)). Liver Function Tests: Recent Labs  Lab 01/23/21 0920  AST 86*  ALT 32  ALKPHOS 82  BILITOT 3.9*  PROT 8.6*  ALBUMIN 2.3*   No results for input(s): LIPASE, AMYLASE in the last 168 hours. No results for input(s): AMMONIA in the last 168 hours. Coagulation Profile: Recent Labs  Lab 01/23/21 0920  INR 1.6*   Cardiac Enzymes: No results for input(s): CKTOTAL, CKMB, CKMBINDEX, TROPONINI in the last 168 hours. BNP (last 3 results) No results for input(s): PROBNP in the last 8760 hours. HbA1C: No results for input(s): HGBA1C in the last 72 hours. CBG: No results for input(s): GLUCAP in the last 168 hours. Lipid Profile: No results for input(s): CHOL, HDL, LDLCALC, TRIG, CHOLHDL, LDLDIRECT in the last 72 hours. Thyroid Function Tests: No results for input(s): TSH, T4TOTAL, FREET4, T3FREE, THYROIDAB in the last 72 hours. Anemia Panel: No results for input(s): VITAMINB12, FOLATE, FERRITIN, TIBC, IRON, RETICCTPCT in the last 72 hours. Sepsis Labs: Recent Labs  Lab 01/23/21 0901 01/23/21 1119 01/24/21 0416  PROCALCITON  --   --  1.50  LATICACIDVEN 4.8* 4.4* 4.2*    Recent Results (from the past 240 hour(s))  Blood Culture (routine x  2)     Status: None (Preliminary result)   Collection Time: 01/23/21  4:20 AM   Specimen: BLOOD  Result Value Ref Range Status   Specimen Description BLOOD SITE NOT SPECIFIED  Final   Special Requests   Final    BOTTLES DRAWN AEROBIC ONLY Blood Culture results may not be optimal due to an inadequate volume of blood received in culture bottles   Culture   Final    NO GROWTH < 24 HOURS Performed at [facility], [place] [address] [place], [place] [address]    Report Status PENDING  Incomplete  Resp Panel by RT-PCR (Flu A&B, Covid) Nasopharyngeal Swab     Status: None   Collection Time: 01/23/21 11:19 AM   Specimen: Nasopharyngeal Swab; Nasopharyngeal(NP) swabs in vial transport medium  Result Value Ref Range Status   SARS Coronavirus 2 by RT PCR NEGATIVE NEGATIVE Final    Comment: (NOTE) SARS-CoV-2 target nucleic acids are NOT DETECTED.  The SARS-CoV-2 RNA is generally detectable in upper respiratory specimens during the acute phase of infection. The lowest concentration of SARS-CoV-2 viral copies this assay can detect is 138 copies/mL. A negative result does not preclude SARS-Cov-2 infection and should not be used as the sole basis for treatment or other patient management decisions. A negative result may occur with  improper specimen collection/handling, submission of specimen other than nasopharyngeal swab, presence of viral mutation(s) within the areas targeted by this assay, and inadequate number of viral copies(<138 copies/mL). A negative result must be combined with clinical observations, patient history, and epidemiological information. The expected result is Negative.  Fact Sheet for Patients:  EntrepreneurPulse.com.au  Fact Sheet for Healthcare Providers:  IncredibleEmployment.be  This test is no t yet approved or cleared by the [place] FDA and  has been authorized for detection and/or diagnosis of SARS-CoV-2 by FDA under  an Emergency Use Authorization (EUA). This EUA will remain  in effect (meaning this test can be used) for the duration of the COVID-19 declaration under Section 564(b)(1) of the Act, 21 U.S.C.section 360bbb-3(b)(1), unless the authorization is terminated  or revoked sooner.       Influenza A by PCR NEGATIVE NEGATIVE Final   Influenza B by  PCR NEGATIVE NEGATIVE Final    Comment: (NOTE) The Xpert Xpress SARS-CoV-2/FLU/RSV plus assay is intended as an aid in the diagnosis of influenza from Nasopharyngeal swab specimens and should not be used as a sole basis for treatment. Nasal washings and aspirates are unacceptable for Xpert Xpress SARS-CoV-2/FLU/RSV testing.  Fact Sheet for Patients: EntrepreneurPulse.com.au  Fact Sheet for Healthcare Providers: IncredibleEmployment.be  This test is not yet approved or cleared by the [place] FDA and has been authorized for detection and/or diagnosis of SARS-CoV-2 by FDA under an Emergency Use Authorization (EUA). This EUA will remain in effect (meaning this test can be used) for the duration of the COVID-19 declaration under Section 564(b)(1) of the Act, 21 U.S.C. section 360bbb-3(b)(1), unless the authorization is terminated or revoked.  Performed at [facility], [place] [address] [place], [place] [address]       Radiology Studies: CT Angio Chest PE W and/or Wo Contrast  Result Date: 01/23/2021 CLINICAL DATA:  Patient with chest pain. EXAM: CT ANGIOGRAPHY CHEST WITH CONTRAST TECHNIQUE: Multidetector CT imaging of the chest was performed using the standard protocol during bolus administration of intravenous contrast. Multiplanar CT image reconstructions and MIPs were obtained to evaluate the vascular anatomy. CONTRAST:  32m OMNIPAQUE IOHEXOL 350 MG/ML SOLN COMPARISON:  CT chest 09/29/2020. FINDINGS: Cardiovascular: Heart is enlarged, particularly right heart. There is reflux of contrast into the  hepatic veins. Small pericardial effusion. Thoracic aortic vascular calcifications. Adequate opacification of the pulmonary arteries. Motion artifact limits evaluation of the distal pulmonary arteries. Within the above limitation, no evidence for acute pulmonary embolus. Mediastinum/Nodes: No enlarged axillary, mediastinal or hilar lymphadenopathy. Normal appearance of the esophagus. Lungs/Pleura: Central airways are patent. There are small bilateral pleural effusions. The right pleural effusion appears partially loculated. Bandlike patchy ground-glass and consolidative opacities throughout the lungs bilaterally. Upper Abdomen: Unremarkable. Musculoskeletal: Thoracic spine degenerative changes. There is osseous destruction of the superior endplate of the T624THLvertebral body. There is loss of disc space height at the T10-T11 level. This is most compatible with discitis osteomyelitis is demonstrated on prior thoracic spine MRI 12/25/2020. Review of the MIP images confirms the above findings. IMPRESSION: 1. No evidence for acute pulmonary embolus. 2. Bilateral pleural effusions. The right pleural effusion appears to be partially loculated. There are scattered bandlike consolidative opacities which may represent atelectasis and or infection. 3. T10-T11 discitis osteomyelitis as demonstrated on prior MRI of the thoracic spine 12/25/2020. 4. The right heart is enlarged. Secondary findings suggestive of right heart failure including reflux of contrast into the hepatic veins. 5. Aortic Atherosclerosis (ICD10-I70.0). Electronically Signed   By: DLovey NewcomerM.D.   On: 01/23/2021 12:45   DG Chest Portable 1 View  Result Date: 01/23/2021 CLINICAL DATA:  Chest pain. EXAM: PORTABLE CHEST 1 VIEW COMPARISON:  Prior chest radiographs 12/22/2020 and earlier. FINDINGS: Cardiomegaly. Prior cardiac valve replacement. Central pulmonary vascular congestion. Patchy bilateral airspace opacities. Redemonstrated architectural distortion  within the right mid lung. Right greater than left pleural effusions, increased from the prior chest radiograph of 12/22/2020. The right pleural effusion may be loculated. No evidence of pneumothorax. No acute bony abnormality identified. A vascular stent projects in the left subclavicular region. IMPRESSION: Cardiomegaly with pulmonary vascular congestion. Patchy bilateral pulmonary opacities have progressed as compared to the chest radiograph of 12/22/2020 and are favored to reflect pulmonary edema. Pneumonia considered less likely, although not excluded. Progressive right greater than left pleural effusions. The right pleural effusion may be loculated. Redemonstrated architectural distortion  within the right mid lung. Electronically Signed   By: Kellie Simmering DO   On: 01/23/2021 10:26   ECHOCARDIOGRAM COMPLETE  Result Date: 01/24/2021    ECHOCARDIOGRAM REPORT   Patient Name:   SHAWNN WOMBLE Date of Exam: 01/24/2021 Medical Rec #:  [id number]      Height:       72.0 in Accession #:    [id number]     Weight:       144.4 lb Date of Birth:  28-Sep-1970      BSA:          1.855 m Patient Age:    57 years       BP:           118/90 mmHg Patient Gender: M              HR:           92 bpm. Exam Location:  Inpatient Procedure: 2D Echo, 3D Echo, Cardiac Doppler and Color Doppler Indications:    NSTEMI  History:        Patient has prior history of Echocardiogram examinations, most                 recent 12/24/2020. Risk Factors:Hypertension. S/P PFO closure.                 S/P MVR (31 mm Medtronic bioprosthetic valve present in the                 mitral position. ESRD.                  Mitral Valve: 31 mm Mosaic bioprosthetic valve valve is present                 in the mitral position.  Sonographer:    Clayton Lefort RDCS (AE) Referring Phys: [id number] [place]  1. Left ventricular ejection fraction, by estimation, is 60 to 65%. The left ventricle has normal function. The left ventricle has no  regional wall motion abnormalities. There is severe left ventricular hypertrophy. Left ventricular diastolic parameters  are indeterminate.  2. Right ventricular systolic function is normal. The right ventricular size is mildly enlarged. There is moderately elevated pulmonary artery systolic pressure. The estimated right ventricular systolic pressure is 0000000 mmHg.  3. Right atrial size was severely dilated.  4. 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 The mitral valve has been repaired/replaced. Trivial mitral valve regurgitation. Severe mitral stenosis. The mean mitral valve gradient is 12.0 mmHg. There is a 31 mm Mosaic bioprosthetic valve present in the mitral position. Echo findings are consistent with rocking, dehiscence, stenosis and fracture/perforation the mitral prosthesis.  5. Eccentric TR directed at the intraventricular septum. The tricuspid valve is abnormal. Tricuspid valve regurgitation is moderate to severe.  6. The aortic valve is tricuspid. Aortic valve regurgitation is not visualized. Mild aortic valve sclerosis is present, with no evidence of aortic valve stenosis. Aortic valve mean gradient measures 3.0 mmHg.  7. The inferior vena cava is dilated in size with <50% respiratory variability, suggesting right atrial pressure of 15 mmHg. Comparison(s): Changes  from prior study are noted. 12/24/2020: (TEE) - LVEF 60-65%, mitral valve prosthesis with vegetation noted. I previous performed the TEE - there has been significant progression of endocarditis of the valve with new valve instability that was not previously noted on  12/24/20. Conclusion(s)/Recommendation(s): Critical findings reported to Dr. Audie Box and acknowledged at 12:30 pm. FINDINGS  Left Ventricle: Left ventricular ejection fraction, by estimation, is 60 to 65%. The left ventricle has normal function. The left ventricle has no regional wall motion abnormalities. The left ventricular internal cavity size was normal in size. There is  severe left ventricular hypertrophy. Left ventricular diastolic function could not be evaluated due to mitral valve replacement. Left ventricular diastolic parameters are indeterminate. Right Ventricle: The right ventricular size is mildly enlarged. No increase in right ventricular wall thickness. Right ventricular systolic function is normal. There is moderately elevated pulmonary artery systolic pressure. The tricuspid regurgitant velocity is 3.31 m/s, and with an assumed right atrial pressure of 15 mmHg, the estimated right ventricular systolic pressure is 0000000 mmHg. Left Atrium: Left atrial size was normal in size. Right Atrium: Right atrial size was severely dilated. Pericardium: There is no evidence of pericardial effusion. 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 The mitral valve has been repaired/replaced. Trivial mitral valve regurgitation. There is a 31 mm Mosaic bioprosthetic valve present in the mitral position. Echo findings are consistent with abnormal rocking of, dehiscence of, stenosis of and fracture/perforation of the mitral prosthesis. Severe mitral valve stenosis. MV peak gradient, 22.1 mmHg. The mean mitral valve gradient is 12.0  mmHg with average heart rate of 91 bpm. Tricuspid Valve: Eccentric TR directed at the intraventricular septum. The tricuspid valve is abnormal. Tricuspid valve regurgitation is moderate to severe. Aortic Valve: The aortic valve is tricuspid. Aortic valve regurgitation is not visualized. Mild aortic valve sclerosis is present, with no evidence of aortic valve stenosis. Aortic valve mean gradient measures 3.0 mmHg. Aortic valve peak gradient measures 7.7 mmHg. Aortic valve area, by VTI measures 1.61 cm. Pulmonic Valve: The pulmonic valve was grossly normal. Pulmonic valve regurgitation is trivial. Aorta: The aortic root and ascending aorta are structurally normal, with no evidence of dilitation. Venous: The inferior vena cava is dilated in size with less than 50% respiratory variability, suggesting right atrial pressure of 15 mmHg. IAS/Shunts: No atrial level shunt detected by color flow Doppler.  LEFT VENTRICLE PLAX 2D LVIDd:         3.90 cm LVIDs:         2.80 cm LV PW:         1.70 cm LV IVS:        1.70 cm LVOT diam:     1.90 cm LV SV:         31 LV SV Index:   17 LVOT Area:     2.84 cm  RIGHT VENTRICLE             IVC RV Basal diam:  3.90 cm     IVC diam: 2.30 cm RV Mid diam:    3.30 cm RV S prime:     10.30 cm/s TAPSE (M-mode): 1.6  cm LEFT ATRIUM             Index       RIGHT ATRIUM           Index LA diam:        4.60 cm 2.48 cm/m  RA Area:     27.50 cm LA Vol (A2C):   56.2 ml 30.29 ml/m RA Volume:   96.70 ml  52.13 ml/m LA Vol (A4C):   47.0 ml 25.34 ml/m LA Biplane Vol: 52.8 ml 28.46 ml/m  AORTIC VALVE AV Area (Vmax):    1.45 cm AV Area (Vmean):   1.38 cm AV Area (VTI):     1.61 cm AV Vmax:           139.00 cm/s AV Vmean:          85.000 cm/s AV VTI:            0.192 m AV Peak Grad:      7.7 mmHg AV Mean Grad:      3.0 mmHg LVOT Vmax:         70.90 cm/s LVOT Vmean:        41.500 cm/s LVOT VTI:          0.109 m LVOT/AV VTI ratio: 0.57  AORTA Ao Root diam: 3.70 cm Ao Asc diam:  3.70 cm MITRAL VALVE              TRICUSPID VALVE MV Area VTI:  0.59 cm   TR Peak grad:   43.8 mmHg MV Peak grad: 22.1 mmHg  TR Vmax:        331.00 cm/s MV Mean grad: 12.0 mmHg MV Vmax:      2.35 m/s   SHUNTS MV Vmean:     170.0 cm/s Systemic VTI:  0.11 m                          Systemic Diam: 1.90 cm Lyman Bishop MD Electronically signed by Lyman Bishop MD Signature Date/Time: 01/24/2021/12:32:23 PM    Final       Scheduled Meds:  aspirin  325 mg Oral Daily   atorvastatin  40 mg Oral Daily   Chlorhexidine Gluconate Cloth  6 each Topical Q0600   cinacalcet  60 mg Oral Q breakfast   diltiazem  120 mg Oral Daily   docusate sodium  100 mg Oral BID   heparin  5,000 Units Subcutaneous Q8H   methocarbamol  500 mg Oral TID   metoprolol tartrate  25 mg Oral BID   oxyCODONE  15 mg Oral Q12H   pantoprazole  40 mg Oral Daily   pregabalin  75 mg Oral QHS   rifampin  300 mg Oral Q8H   senna-docusate  1 tablet Oral BID   sevelamer carbonate  1,600 mg Oral TID WC   sodium chloride flush  3 mL Intravenous Q12H   Continuous Infusions:   LOS: 1 day      Time spent: 35 minutes   Dessa Phi, DO [facility] 01/24/2021, 1:07 PM   Available via Epic secure chat 7am-7pm After these hours, please refer to coverage provider listed on [email or web address]

## 2021-01-24 NOTE — Progress Notes (Signed)
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 admitted with sepsis and chest pain  Subjective:  HD overnight,  3L UF No c/o thiS AM, feels a little better CV and [facility] notes reviewed On Vanc / Rifampin  06/19 0701 - 06/20 0700 In: -  Out: 3020   Filed Weights   01/24/21 0143  Weight: 65.5 kg    Scheduled Meds:  aspirin  325 mg Oral Daily   atorvastatin  40 mg Oral Daily   Chlorhexidine Gluconate Cloth  6 each Topical Q0600   cinacalcet  60 mg Oral Q breakfast   diltiazem  120 mg Oral Daily   docusate sodium  100 mg Oral BID   heparin  5,000 Units Subcutaneous Q8H   methocarbamol  500 mg Oral TID   metoprolol tartrate  25 mg Oral BID   oxyCODONE  15 mg Oral Q12H   pantoprazole  40 mg Oral Daily   pregabalin  75 mg Oral QHS   rifampin  300 mg Oral Q8H   senna-docusate  1 tablet Oral BID   sevelamer carbonate  1,600 mg Oral TID WC   sodium chloride flush  3 mL Intravenous Q12H   Continuous Infusions: PRN Meds:.acetaminophen **OR** acetaminophen, bisacodyl, calcium carbonate (dosed in mg elemental calcium), camphor-menthol **AND** hydrOXYzine, docusate sodium, feeding supplement (NEPRO CARB STEADY), ondansetron **OR** ondansetron (ZOFRAN) IV, polyethylene glycol, sorbitol, zolpidem  Current Labs: reviewed    Physical Exam:  Blood pressure 95/71, pulse 91, temperature (!) 97.4 F (36.3 C), temperature source Oral, resp. rate 16, height 6' (1.829 m), weight 65.5 kg, SpO2 95 %. NAD,c hronically ill appearing NCAT RRR, 3/6 MSM CTAB LUE AVG +B, bandaged S/ntn/d R BKA stump LLE no edema  Dialysis Orders:  MWF at [facility] 4hr, 350/500, EDW 67kg, 2K/2Ca, UFP#1, AVG, heparin 5000 - Mircera 236mg Iv q 2 weeks (last '150mg'$  on 6/6) - No VDRA - Has been getting Vanc '750mg'$  x HD, does not appear to have missed any. Vanc trough 17.3 on 01/17/21.   Assessment/Plan:   Sepsis/Leukocytosis: Recurrent admits for MRSA bacteremia with endocarditis + osteomyelitis. CXR with B effusions, of which the R side may be loculated - may need thoracentesis per ID. Vanc/Rifampin. Elevated troponin: Likely demand ischemia, per cardiology - follow. Dyspnea/Pleural effusions: as above ESRD:  Usual MWF schedule - Reassess daily  Hypertension/volume: improved from admit  Anemia: Hgb 9.4  rece ESA [id number] Metabolic bone disease: Ca ok, cont binders  Nutrition:  Adding protein supplements.  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 Morphine should not be used.  Baclofen should be avoided Avoid oral sodium phosphate and magnesium citrate based laxatives / bowel preps    RPearson GrippeMD 01/24/2021, 10:34 AM  Recent Labs  Lab 01/23/21 0920 01/24/21 0416  NA 133* 133*  K 4.1 3.9  CL 91* 93*  CO2 22 24  GLUCOSE 85 102*  BUN 25* 15  CREATININE 6.32* 4.40*  CALCIUM 8.1* 7.8*   Recent Labs  Lab 01/23/21 0920 01/24/21 0416  WBC 17.5* 19.7*  NEUTROABS 14.6*  --   HGB 9.4* 9.6*  HCT 33.8* 33.4*  MCV 91.6 87.7  PLT 259 229

## 2021-01-24 NOTE — Consult Note (Addendum)
Consultation Note Date: 01/24/2021   Patient Name: Patrick Anthony  DOB: Jul 08, 1971  MRN: [id number]  Age / Sex: 50 y.o., male  PCP: [name], MD Referring Physician: Dessa Phi, DO  Reason for Consultation: Establishing goals of care  HPI/Patient Profile: 50 y.o. 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 He presented to the emergency department on 01/23/2021 with chest pain.  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 He underwent MVR on 10/14/20  and right AKA on 10/29/20.  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 He was recommended to have 6 weeks of vanc/Rifampin treatment (through 7/8) and completed 2 weeks of gentamicin treatment. CT surgery determined that he is not a candidate for redo valve replacement.   Since discharge, he reports what "feels like heartburn" periodically - particularly at HD sessions.  In the ED, high sensitivity troponin 1110. EKG with non-specific ST changes. Lactate 4.8 and WBC 17.5. Patient was admitted to hospitalist service with sepsis and elevated troponin  Clinical Assessment and Goals of Care: I have reviewed medical records including EPIC notes, labs and imaging, and met at bedside with patient  to discuss diagnosis, prognosis, GOC, EOL wishes, disposition, and options.  Patient is known to PMT from his previous hospitalizations and was seen in April and again in May. I re-introduced Palliative Medicine as specialized medical care for people living with serious  illness. It focuses on providing relief from the symptoms and stress of a serious illness.   We reviewed patient's social history. He is not married. He has 2 daughters as well as 2 granddaughters. 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 His family has been his motivation to "keep going". He has been hopeful for more time to see his granddaughters grow up.   Prior to his hospitalization in February, he lived independently at home. After his prolonged hospitalization 2/21-4/6, he was discharged to [facility] for rehab. He has resided there ever since. As far as functional status, he has been non-ambulatory since undergoing left BKA - he reports this is due to issues obtaining a prosthesis. He is able to transfer to the wheelchair. He reports his appetite os fair.   We discussed his current illness and what it means in the larger context of his ongoing co-morbidities.  Natural disease trajectory of chronic illness was discussed, emphasizing there are acute exacerbations that require increased levels of care. I provided validation that he has endured serious and life-threatening illness over the past few months. Discussed current concern for recurrence/progression of endocarditis of the prosthetic mitral valve. Discussed that per cardiology, he is not a candidate for redo surgery. He verbalizes understanding that his condition is very serious stating  he has been told that he "may not survive". He is awaiting further recommendations from the medical team regarding options.   I attempted to elicit values and goals of care important to the patient. He verbalizes he does not want to die in the hospital - he would prefer to pass away peacefully at home. The difference between full scope medical intervention and comfort care was considered in light of the patient's goals of care. Discussed that he would be eligible for hospice services either at home or at SNF if he chooses to focus on comfort rather  than full scope interventions. Patient seems receptive to the idea of comfort care, however is waiting to receive a "final answer" from the medical team.   We did discuss code status. Encouraged patient to consider DNR/DNI status understanding evidenced based poor outcomes in similar hospitalized patients, as the cause of the arrest is likely associated with chronic/terminal disease rather than a reversible acute cardio-pulmonary event. I explained that DNR/DNI does not change the medical plan and it only comes into effect after a person has arrested (died).  It is a protective measure to keep [place] from harming the patient in their last moments of life. He seems to indicate that he would agree that DNR is appropriate however is not yet ready to make that decision.    Primary decision maker: Patient. Advanced directive on file in [place] naming daughter [name] as primary HCPOA and other daughter [name] as Chief Financial Officer.     SUMMARY OF RECOMMENDATIONS   Full code with ongoing discussions Continue full scope medical treatment Patient understands he "may not survive" his illness, but is waiting for the "final answer" from the medical team before considering comfort care Goal of care: He does not want to die in the hospital PMT will continue to follow   Code Status/Advance Care Planning: Full code  Symptom Management:  Oxycontin 15 mg every 12 hours  Palliative Prophylaxis:  Frequent Pain Assessment  Additional Recommendations (Limitations, Scope, Preferences): Full Scope Treatment  Prognosis:  poor  Discharge Planning: To Be Determined      Primary Diagnoses: Present on Admission:  Sepsis due to undetermined organism ([place])  Lactic acidosis  Chronic pain disorder   I have reviewed the medical record, interviewed the patient and family, and examined the patient. The following aspects are pertinent.  Past Medical History:  Diagnosis Date   Bacterial endocarditis 09/27/2020    MRSA   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 09/29/2020   Patient needs f/u CT chest 3 months   Chronic kidney disease    patient on dialysis tues-thurs-sat   Cirrhosis of liver ([place])    Drug addiction ([place])    ETOH abuse    Hepatitis    Hypertension    Mitral regurgitation    Patent foramen ovale 10/23/2019   Renal disorder renal failure   S/P minimally-invasive mitral valve replacement with bioprosthetic valve 10/14/2020   31 mm Medtronic Mosaic stented porcine bioprosthetic tissue valve via right mini thoracotomy approach   Sleep apnea    Systolic and diastolic CHF, 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[place]) 04/19/2013    Family History  Problem Relation Age of Onset   Hypertension Mother    Cancer Mother        breast   Scheduled Meds:  aspirin  325 mg Oral Daily   atorvastatin  40 mg Oral Daily   Chlorhexidine Gluconate Cloth  6 each Topical Q0600   cinacalcet  60 mg Oral  Q breakfast   diltiazem  120 mg Oral Daily   docusate sodium  100 mg Oral BID   heparin  5,000 Units Subcutaneous Q8H   methocarbamol  500 mg Oral TID   metoprolol tartrate  25 mg Oral BID   oxyCODONE  15 mg Oral Q12H   pantoprazole  40 mg Oral Daily   pregabalin  75 mg Oral QHS   rifampin  300 mg Oral Q8H   senna-docusate  1 tablet Oral BID   sevelamer carbonate  1,600 mg Oral TID WC   sodium chloride flush  3 mL Intravenous Q12H   vancomycin variable dose per unstable renal function (pharmacist dosing)   Does not apply See admin instructions   Continuous Infusions: PRN Meds:.acetaminophen **OR** acetaminophen, bisacodyl, calcium carbonate (dosed in mg elemental calcium), camphor-menthol **AND** hydrOXYzine, docusate sodium, feeding supplement (NEPRO CARB STEADY), ondansetron **OR** ondansetron (ZOFRAN) IV, polyethylene glycol, sorbitol, zolpidem Medications Prior to Admission:  Prior to Admission medications   Medication Sig Start Date End Date Taking? Authorizing Provider  acetaminophen (TYLENOL) 325 MG tablet  Take 325-650 mg by mouth every 6 (six) hours as needed for mild pain or fever. Temp > 100.5   Yes [provider]  aspirin EC 325 MG EC tablet Take 1 tablet (325 mg total) by mouth daily. 11/10/20  Yes Samella Parr, NP  bisacodyl (DULCOLAX) 10 MG suppository Place 1 suppository (10 mg total) rectally daily as needed for moderate constipation. 11/09/20  Yes Samella Parr, NP  calcium acetate (PHOSLO) 667 MG capsule Take 667 mg by mouth 3 (three) times daily with meals.   Yes [provider]  cinacalcet (SENSIPAR) 30 MG tablet Take 2 tablets (60 mg total) by mouth daily with breakfast. 11/10/20  Yes Samella Parr, NP  diltiazem (CARDIZEM CD) 120 MG 24 hr capsule Take 1 capsule (120 mg total) by mouth daily. 01/07/21  Yes Ghimire, Henreitta Leber, MD  docusate sodium (COLACE) 100 MG capsule Take 1 capsule (100 mg total) by mouth 2 (two) times daily. 11/09/20  Yes Samella Parr, NP  methocarbamol (ROBAXIN) 500 MG tablet Take 1 tablet (500 mg total) by mouth 3 (three) times daily. 11/09/20  Yes Samella Parr, NP  metoprolol tartrate (LOPRESSOR) 25 MG tablet Take 25 mg by mouth 2 (two) times daily.   Yes [provider]  Multiple Vitamin (MULTIVITAMIN WITH MINERALS) TABS tablet Take 1 tablet by mouth daily.   Yes [provider]  nitroGLYCERIN (NITROSTAT) 0.4 MG SL tablet Place 0.4 mg under the tongue every 5 (five) minutes x 3 doses as needed for chest pain.    Yes [provider]  Nutritional Supplements (,FEEDING SUPPLEMENT, PROSOURCE PLUS) liquid Take 30 mLs by mouth 2 (two) times daily between meals. 11/09/20  Yes Samella Parr, NP  Nutritional Supplements (FEEDING SUPPLEMENT, NEPRO CARB STEADY,) LIQD Take 237 mLs by mouth 3 (three) times daily between meals. 11/09/20  Yes Samella Parr, NP  oxyCODONE (OXYCONTIN) 15 mg 12 hr tablet Take 1 tablet (15 mg total) by mouth every 12 (twelve) hours. 01/06/21  Yes Ghimire, Henreitta Leber, MD  pantoprazole (PROTONIX) 40 MG  tablet Take 1 tablet (40 mg total) by mouth daily. 11/10/20  Yes Samella Parr, NP  polyethylene glycol (MIRALAX / GLYCOLAX) 17 g packet Take 17 g by mouth daily as needed for mild constipation. 11/09/20  Yes Samella Parr, NP  pregabalin (LYRICA) 75 MG capsule Take 1 capsule (75 mg total) by  mouth at bedtime. 11/09/20  Yes Samella Parr, NP  rifampin (RIFADIN) 300 MG capsule Take 1 capsule (300 mg total) by mouth every 8 (eight) hours. 6 weeks from 5/27 01/06/21  Yes Ghimire, Henreitta Leber, MD  senna-docusate (SENOKOT-S) 8.6-50 MG tablet Take 1 tablet by mouth 2 (two) times daily. 11/09/20  Yes Samella Parr, NP  sevelamer carbonate (RENVELA) 800 MG tablet Take 1,600 mg by mouth 3 (three) times daily with meals.   Yes [provider]  Vancomycin (VANCOCIN) 750-5 MG/150ML-% SOLN Inject 150 mLs (750 mg total) into the vein every Monday, Wednesday, and Friday with hemodialysis. 6 weeks of vancomycin from 5/27 Patient taking differently: Inject 750 mg into the vein See admin instructions. 750 mg into the vein on Mon/Wed/Fri with dialysis- START DATE: 01/10/2021 and END DATE: 02/18/2021 12/31/20  Yes Ghimire, Henreitta Leber, MD  Darbepoetin Alfa (ARANESP, ALBUMIN FREE, IJ) Darbepoetin Alfa (Aranesp) 07/05/20 07/04/21  [provider]  metoprolol tartrate (LOPRESSOR) 50 MG tablet Take 0.5 tablets (25 mg total) by mouth 2 (two) times daily. Patient not taking: No sig reported 01/06/21   Jonetta Osgood, MD   No Known Allergies Review of Systems  Cardiovascular:  Positive for chest pain.   Physical Exam Constitutional:      General: He is not in acute distress. Pulmonary:     Effort: Pulmonary effort is normal.  Musculoskeletal:     Right Lower Extremity: Right leg is amputated above knee.  Neurological:     Mental Status: He is alert and oriented to person, place, and time.  Psychiatric:        Mood and Affect: Affect is flat.    Vital Signs: BP 118/90   Pulse 92   Temp (!) 97.4  F (36.3 C) (Oral)   Resp 16   Ht 6' (1.829 m)   Wt 65.5 kg   SpO2 98%   BMI 19.58 kg/m  Pain Scale: 0-10   Pain Score: 8    SpO2: SpO2: 98 % O2 Device:SpO2: 98 %   IO: Intake/output summary:  Intake/Output Summary (Last 24 hours) at 01/24/2021 1449 Last data filed at 01/23/2021 2332 Gross per 24 hour  Intake --  Output 3020 ml  Net -3020 ml    LBM: Last BM Date: 01/22/21 Baseline Weight: Weight: 65.5 kg Most recent weight: Weight: 65.5 kg      Palliative Assessment/Data: PPS 40%     Time In: 1700 Time Out: 1811 Time Total: 71 minutes Greater than 50%  of this time was spent counseling and coordinating care related to the above assessment and plan.  Signed by: Lavena Bullion, NP   Please contact Palliative Medicine Team phone at [phone] for questions and concerns.  For individual provider: See Shea Evans

## 2021-01-24 NOTE — Progress Notes (Signed)
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 No fever otherwise but did have leukocytosis (neutrophilic). His tte is showing unstable MV prosthesis with concern of progression of endocarditis potentially perivalvular abscess. Blood cx is cooking  Previously had done 2 weeks of gentamycin and is on continuation of 6 weeks vanc/rifampin until 7/16. However given TTE finding, will need CTS input again   Discussed with primary team  PLAN: Continue vanc rifampin. Duration per previous treatment plan F/u blood culture Please discuss with CTS regarding tte finding    Principal Problem:   Sepsis due to undetermined organism [facility]) Active Problems:   ESRD on dialysis ([place])   Lactic acidosis   S/P mitral valve replacement   S/P AKA (above knee amputation), right (HCC)   Chronic pain disorder   No Known Allergies  Scheduled Meds:  aspirin  325 mg Oral Daily   atorvastatin  40 mg Oral Daily   Chlorhexidine Gluconate Cloth  6 each Topical Q0600   cinacalcet  60 mg Oral Q breakfast   diltiazem  120 mg Oral Daily   docusate sodium  100 mg Oral BID   heparin  5,000 Units Subcutaneous Q8H    methocarbamol  500 mg Oral TID   metoprolol tartrate  25 mg Oral BID   oxyCODONE  15 mg Oral Q12H   pantoprazole  40 mg Oral Daily   pregabalin  75 mg Oral QHS   rifampin  300 mg Oral Q8H   senna-docusate  1 tablet Oral BID   sevelamer carbonate  1,600 mg Oral TID WC   sodium chloride flush  3 mL Intravenous Q12H   Continuous Infusions: PRN Meds:.acetaminophen **OR** acetaminophen, bisacodyl, calcium carbonate (dosed in mg elemental calcium), camphor-menthol **AND** hydrOXYzine, docusate sodium, feeding supplement (NEPRO CARB STEADY), ondansetron **OR** ondansetron (ZOFRAN) IV, polyethylene glycol, sorbitol, zolpidem   SUBJECTIVE: Chest pain resolved Feeling better No fever No sob No n/v/diarrhea Right aka stump without issue No back pain or other joint pain  Tte reviewed (post visit with patient)  Review of Systems: ROS All other ROS was negative, except mentioned above     OBJECTIVE: Vitals:   01/24/21 0106 01/24/21 0143 01/24/21 0802 01/24/21 1153  BP:  (!) 127/91 95/71 118/90  Pulse: (!) 103 100 91 92  Resp:  (!) '24 16 16  '$ Temp:  98 F (36.7 C) (!) 97.4 F (36.3 C) (!) 97.4 F (36.3 C)  TempSrc:  Oral Oral Oral  SpO2: 96% 96% 95% 98%  Weight:  65.5 kg    Height:  6' (1.829 m)     Body mass index is 19.58 kg/m.  Physical Exam General/constitutional: no distress, pleasant HEENT: Normocephalic, PER, Conj Clear, EOMI, Oropharynx clear Neck supple CV: rrr -- systolic/diastolic  murmur Lungs: clear to auscultation, normal respiratory effort Abd: Soft, Nontender Ext: no edema Skin: No Rash Neuro: nonfocal MSK: right aka stump one small area (<1cm) eschar along previous mostly healed incision) -- no redness/swelling/tenderness  Lab Results Lab Results  Component Value Date   WBC 19.7 (H) 01/24/2021   HGB 9.6 (L) 01/24/2021   HCT 33.4 (L) 01/24/2021   MCV 87.7 01/24/2021   PLT 229 01/24/2021    Lab Results  Component Value Date   CREATININE 4.40 (H)  01/24/2021   BUN 15 01/24/2021   NA 133 (L) 01/24/2021   K 3.9 01/24/2021   CL 93 (L) 01/24/2021   CO2 24 01/24/2021    Lab Results  Component Value Date   ALT 32 01/23/2021   AST 86 (H) 01/23/2021   ALKPHOS 82 01/23/2021   BILITOT 3.9 (H) 01/23/2021      Microbiology: Recent Results (from the past 240 hour(s))  Blood Culture (routine x 2)     Status: None (Preliminary result)   Collection Time: 01/23/21  4:20 AM   Specimen: BLOOD  Result Value Ref Range Status   Specimen Description BLOOD SITE NOT SPECIFIED  Final   Special Requests   Final    BOTTLES DRAWN AEROBIC ONLY Blood Culture results may not be optimal due to an inadequate volume of blood received in culture bottles   Culture   Final    NO GROWTH < 24 HOURS Performed at [facility], [address], [place], [place] [address]    Report Status PENDING  Incomplete  Resp Panel by RT-PCR (Flu A&B, Covid) Nasopharyngeal Swab     Status: None   Collection Time: 01/23/21 11:19 AM   Specimen: Nasopharyngeal Swab; Nasopharyngeal(NP) swabs in vial transport medium  Result Value Ref Range Status   SARS Coronavirus 2 by RT PCR NEGATIVE NEGATIVE Final    Comment: (NOTE) SARS-CoV-2 target nucleic acids are NOT DETECTED.  The SARS-CoV-2 RNA is generally detectable in upper respiratory specimens during the acute phase of infection. The lowest concentration of SARS-CoV-2 viral copies this assay can detect is 138 copies/mL. A negative result does not preclude SARS-Cov-2 infection and should not be used as the sole basis for treatment or other patient management decisions. A negative result may occur with  improper specimen collection/handling, submission of specimen other than nasopharyngeal swab, presence of viral mutation(s) within the areas targeted by this assay, and inadequate number of viral copies(<138 copies/mL). A negative result must be combined with clinical observations, patient history, and  epidemiological information. The expected result is Negative.  Fact Sheet for Patients:  EntrepreneurPulse.com.au  Fact Sheet for Healthcare Providers:  IncredibleEmployment.be  This test is no t yet approved or cleared by the [place] FDA and  has been authorized for detection and/or diagnosis of SARS-CoV-2 by FDA under an Emergency Use Authorization (EUA). This EUA will remain  in effect (meaning this test can be used) for the duration of the COVID-19 declaration under Section 564(b)(1) of the Act, 21 U.S.C.section 360bbb-3(b)(1), unless the authorization is terminated  or revoked sooner.       Influenza A by PCR NEGATIVE NEGATIVE Final   Influenza B by PCR NEGATIVE NEGATIVE Final    Comment: (NOTE) The Xpert Xpress SARS-CoV-2/FLU/RSV plus assay is intended as an aid in the diagnosis of influenza from Nasopharyngeal swab specimens and should not be used as a sole basis for treatment. Nasal washings and aspirates are unacceptable for Xpert Xpress SARS-CoV-2/FLU/RSV testing.  Fact  Sheet for Patients: EntrepreneurPulse.com.au  Fact Sheet for Healthcare Providers: IncredibleEmployment.be  This test is not yet approved or cleared by the [place] FDA and has been authorized for detection and/or diagnosis of SARS-CoV-2 by FDA under an Emergency Use Authorization (EUA). This EUA will remain in effect (meaning this test can be used) for the duration of the COVID-19 declaration under Section 564(b)(1) of the Act, 21 U.S.C. section 360bbb-3(b)(1), unless the authorization is terminated or revoked.  Performed at [facility], [place] [address], [place],  [address]      Serology:   Imaging: If present, new imagings (plain films, ct scans, and mri) have been personally visualized and interpreted; radiology reports have been reviewed. Decision making incorporated into the Impression /  Recommendations.  6/20 tte  1. Left ventricular ejection fraction, by estimation, is 60 to 65%. The  left ventricle has normal function. The left ventricle has no regional  wall motion abnormalities. There is severe left ventricular hypertrophy.  Left ventricular diastolic parameters   are indeterminate.   2. Right ventricular systolic function is normal. The right ventricular  size is mildly enlarged. There is moderately elevated pulmonary artery  systolic pressure. The estimated right ventricular systolic pressure is  0000000 mmHg.   3. Right atrial size was severely dilated.   4. 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 The mitral valve has been  repaired/replaced. Trivial mitral valve  regurgitation. Severe mitral stenosis. The mean mitral valve gradient is  12.0 mmHg. There is a 31 mm Mosaic bioprosthetic valve present in the  mitral position. Echo findings are consistent with rocking, dehiscence,  stenosis and fracture/perforation the  mitral prosthesis.   5. Eccentric TR directed at the intraventricular septum. The tricuspid  valve is abnormal. Tricuspid valve regurgitation is moderate to severe.   6. The aortic valve is tricuspid. Aortic valve regurgitation is not  visualized. Mild aortic valve sclerosis is present, with no evidence of  aortic valve stenosis. Aortic valve mean gradient measures 3.0 mmHg.   7. The inferior vena cava is dilated in size with <50% respiratory  variability, suggesting right atrial pressure of 15 mmHg.   Comparison(s): Changes from prior study are noted. 12/24/2020:  (TEE) - LVEF  60-65%, mitral valve prosthesis with vegetation noted. I previous  performed the TEE - there has been significant progression of endocarditis  of the valve with new valve  instability that was not previously noted on 12/24/20.   Jabier Mutton, [facility] [place] [phone] pager    01/24/2021, 12:47 PM

## 2021-01-24 NOTE — Progress Notes (Signed)
Patient ID: Patrick Anthony, male   DOB: 1970-09-23, 50 y.o.   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  He also had a cavitary lesion in the left upper lobe of the lung.  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 Sharol Given. He was eventually discharged to skilled nursing for continuation of his vancomycin.  He was readmitted in the middle of last month with sepsis and altered mental status as well as low back pain and blood cultures grew MRSA.  TEE on 12/24/2020 showed vegetation on the mitral valve prosthesis with trivial MR.  There also appeared to be vegetation on aortic valve with no aortic insufficiency.  Left ventricular ejection fraction was 60 to 65%.  There is only mild TR with no evidence of vegetation.  I evaluated the patient on 12/26/2020 and did not feel that he was a candidate for mitral valve replacement due to generally poor condition with end-stage renal disease on hemodialysis, severe protein calorie malnutrition, cirrhosis secondary to hepatitis C, polysubstance abuse, and status post right above-knee amputation for osteomyelitis following his heart surgery.  He also had septic emboli to the brain and likely spinal discitis/osteomyelitis involving T10/T11.  He was discharged on antibiotics and now returns with chest pain and signs of cellulitis or mobility lactic acid of 4.2, procalcitonin of 1.5, and white blood cell count of 19.7.  A repeat echo  shows  rocking of the mitral valve prosthesis with signs of dehiscence in the area of the aorto-mitral curtain suspicious for aortic root abscess.  There is a large mobile mass attached to the bioprosthetic valve leaflets consistent with vegetation prolapsing out of the valve plane.  There appears to be severe bioprosthetic valve stenosis with trivial regurgitation.  There is now moderate to severe tricuspid regurgitation.  There is no aortic insufficiency or stenosis.  He has signs of progression of MRSA prosthetic valve endocarditis despite antibiotic therapy which is predictable.  I still do not think this gentleman is a candidate for redo valve replacement which would likely require reconstruction of the aorto -mitral continuity and multiple valve replacements due to his generally poor condition and multiple comorbid risk factors. I would continue to pursue palliative care because I don't think he will survive this.

## 2021-01-24 NOTE — Progress Notes (Signed)
Documentation of MRSA Hx per ID request:  5/18-6/2 - Recurrent MRSA Bacteremia 2/22 - MRSA Cavitary PNA and Bacterial Endocarditis 09/27/20-11/10/20 - MRSA Mitral Valve Endocarditis w/ Septic Emboli   **Currently on CONTACT Precautions as of 6/19 Admittance date. Room 3E-18.  *No new PCR needed per ID if MRSA hx documented and patient is currently on/remains on contact precautions for MRSA. Infection.

## 2021-01-24 NOTE — Consult Note (Signed)
   [facility] Inpatient Consult   01/24/2021  Loranza Lasker 01/26/1971 [id number]  [place] [[facility]]: Managed Medicaid  Patient screened for hospitalization with noted extreme high risk score for unplanned readmission risk and to assess for potential Managed Medicaid follow up service needs for post hospital transition.  Review of patient's medical record reveals patient is less than 30 days readmission. Discussed in unit progression meeting for barriers to care needs.    Plan:  Continue to follow progress and disposition to assess for post hospital care management needs.    For questions contact:   Natividad Brood, RN BSN [facility] Liaison  [phone] business mobile phone Toll free office [phone]  Fax number: [phone] [email or web address]

## 2021-01-25 DIAGNOSIS — E872 Acidosis: Secondary | ICD-10-CM

## 2021-01-25 DIAGNOSIS — I132 Hypertensive heart and chronic kidney disease with heart failure and with stage 5 chronic kidney disease, or end stage renal disease: Secondary | ICD-10-CM | POA: Diagnosis not present

## 2021-01-25 DIAGNOSIS — R6510 Systemic inflammatory response syndrome (SIRS) of non-infectious origin without acute organ dysfunction: Secondary | ICD-10-CM | POA: Diagnosis not present

## 2021-01-25 DIAGNOSIS — I38 Endocarditis, valve unspecified: Secondary | ICD-10-CM

## 2021-01-25 DIAGNOSIS — I330 Acute and subacute infective endocarditis: Secondary | ICD-10-CM | POA: Diagnosis not present

## 2021-01-25 DIAGNOSIS — Z515 Encounter for palliative care: Secondary | ICD-10-CM | POA: Diagnosis not present

## 2021-01-25 DIAGNOSIS — Z8679 Personal history of other diseases of the circulatory system: Secondary | ICD-10-CM | POA: Diagnosis not present

## 2021-01-25 DIAGNOSIS — N186 End stage renal disease: Secondary | ICD-10-CM | POA: Diagnosis not present

## 2021-01-25 DIAGNOSIS — Z992 Dependence on renal dialysis: Secondary | ICD-10-CM | POA: Diagnosis not present

## 2021-01-25 DIAGNOSIS — T826XXD Infection and inflammatory reaction due to cardiac valve prosthesis, subsequent encounter: Secondary | ICD-10-CM | POA: Diagnosis not present

## 2021-01-25 DIAGNOSIS — R778 Other specified abnormalities of plasma proteins: Secondary | ICD-10-CM | POA: Diagnosis not present

## 2021-01-25 DIAGNOSIS — R0600 Dyspnea, unspecified: Secondary | ICD-10-CM | POA: Diagnosis not present

## 2021-01-25 DIAGNOSIS — Z952 Presence of prosthetic heart valve: Secondary | ICD-10-CM | POA: Diagnosis not present

## 2021-01-25 DIAGNOSIS — Z7189 Other specified counseling: Secondary | ICD-10-CM | POA: Diagnosis not present

## 2021-01-25 DIAGNOSIS — R079 Chest pain, unspecified: Secondary | ICD-10-CM | POA: Diagnosis not present

## 2021-01-25 DIAGNOSIS — E8779 Other fluid overload: Secondary | ICD-10-CM | POA: Diagnosis not present

## 2021-01-25 DIAGNOSIS — Z89611 Acquired absence of right leg above knee: Secondary | ICD-10-CM | POA: Diagnosis not present

## 2021-01-25 DIAGNOSIS — A419 Sepsis, unspecified organism: Secondary | ICD-10-CM | POA: Diagnosis not present

## 2021-01-25 DIAGNOSIS — I5020 Unspecified systolic (congestive) heart failure: Secondary | ICD-10-CM | POA: Diagnosis not present

## 2021-01-25 DIAGNOSIS — D631 Anemia in chronic kidney disease: Secondary | ICD-10-CM | POA: Diagnosis not present

## 2021-01-25 LAB — BASIC METABOLIC PANEL WITH GFR
Anion gap: 18 — ABNORMAL HIGH (ref 5–15)
BUN: 32 mg/dL — ABNORMAL HIGH (ref 6–20)
CO2: 23 mmol/L (ref 22–32)
Calcium: 7.8 mg/dL — ABNORMAL LOW (ref 8.9–10.3)
Chloride: 89 mmol/L — ABNORMAL LOW (ref 98–111)
Creatinine, Ser: 6.05 mg/dL — ABNORMAL HIGH (ref 0.61–1.24)
GFR, Estimated: 11 mL/min — ABNORMAL LOW
Glucose, Bld: 89 mg/dL (ref 70–99)
Potassium: 4 mmol/L (ref 3.5–5.1)
Sodium: 130 mmol/L — ABNORMAL LOW (ref 135–145)

## 2021-01-25 LAB — CBC
HCT: 32.5 % — ABNORMAL LOW (ref 39.0–52.0)
Hemoglobin: 9.5 g/dL — ABNORMAL LOW (ref 13.0–17.0)
MCH: 25.6 pg — ABNORMAL LOW (ref 26.0–34.0)
MCHC: 29.2 g/dL — ABNORMAL LOW (ref 30.0–36.0)
MCV: 87.6 fL (ref 80.0–100.0)
Platelets: 205 K/uL (ref 150–400)
RBC: 3.71 MIL/uL — ABNORMAL LOW (ref 4.22–5.81)
RDW: 25.2 % — ABNORMAL HIGH (ref 11.5–15.5)
WBC: 15.4 K/uL — ABNORMAL HIGH (ref 4.0–10.5)
nRBC: 0.8 % — ABNORMAL HIGH (ref 0.0–0.2)

## 2021-01-25 NOTE — Plan of Care (Signed)
  Problem: Education: Goal: Knowledge of General Education information will improve Description: Including pain rating scale, medication(s)/side effects and non-pharmacologic comfort measures Outcome: Progressing   Problem: Health Behavior/Discharge Planning: Goal: Ability to manage health-related needs will improve Outcome: Progressing   Problem: Clinical Measurements: Goal: Ability to maintain clinical measurements within normal limits will improve Outcome: Progressing Goal: Will remain free from infection Outcome: Progressing Goal: Diagnostic test results will improve Outcome: Progressing Goal: Respiratory complications will improve Outcome: Progressing Goal: Cardiovascular complication will be avoided Outcome: Progressing   Problem: Activity: Goal: Risk for activity intolerance will decrease Outcome: Progressing   Problem: Nutrition: Goal: Adequate nutrition will be maintained Outcome: Progressing   Problem: Coping: Goal: Level of anxiety will decrease Outcome: Progressing   Problem: Elimination: Goal: Will not experience complications related to bowel motility Outcome: Progressing Goal: Will not experience complications related to urinary retention Outcome: Progressing   Problem: Pain Managment: Goal: General experience of comfort will improve Outcome: Progressing   Problem: Safety: Goal: Ability to remain free from injury will improve Outcome: Progressing   Problem: Skin Integrity: Goal: Risk for impaired skin integrity will decrease Outcome: Progressing   Problem: Education: Goal: Understanding of cardiac disease, CV risk reduction, and recovery process will improve Outcome: Progressing Goal: Individualized Educational Video(s) Outcome: Progressing   Problem: Activity: Goal: Ability to tolerate increased activity will improve Outcome: Progressing   Problem: Cardiac: Goal: Ability to achieve and maintain adequate cardiovascular perfusion will  improve Outcome: Progressing   Problem: Health Behavior/Discharge Planning: Goal: Ability to safely manage health-related needs after discharge will improve Outcome: Progressing   Problem: Education: Goal: Ability to demonstrate management of disease process will improve Outcome: Progressing Goal: Ability to verbalize understanding of medication therapies will improve Outcome: Progressing Goal: Individualized Educational Video(s) Outcome: Progressing   Problem: Activity: Goal: Capacity to carry out activities will improve Outcome: Progressing   Problem: Cardiac: Goal: Ability to achieve and maintain adequate cardiopulmonary perfusion will improve Outcome: Progressing

## 2021-01-25 NOTE — Progress Notes (Signed)
Daily Progress Note   Patient Name: Patrick Anthony       Date: 01/25/2021 DOB: [birth year]  Age: 50 y.o. MRN#: [id number] Attending Physician: Dessa Phi, DO Primary Care Physician: [name], MD Admit Date: 01/23/2021  Reason for Consultation/Follow-up: Establishing goals of care  Subjective: I had a lengthy discussion with patient at bedside regarding his prognosis and goals of care. Continued education provided regarding the seriousness of his current medical situation with significant infectious and life-limiting infection.   Patient understands that per CT surgery, he is not a candidate for valve redo surgery. Discussed that the medical team is recommending comfort care and hospice at this point. He understands in order to be eligible for hospice services, he would need to stop HD.  Mr. Renteria is not ready to stop HD at this time. However, he understands his prognosis is poor and his time is limited so he plans to "live each day like it is his last". He wants more time with his family. He expresses he also wishes to continue antibiotics as long as they are offered.   When he starts to "feel bad", he will consider stopping HD. He is aware prognosis is less than 14 days after stopping HD. Discussed that upon making that decision, he could transfer from SNF to residential hospice where they could administer medication as needed to ensure comfort at EOL.   We again discussed code status. Patient is not agreeable to DNR/DNI with understanding that full code includes CPR, defibrillation, ACLS medications, and intubation. He understands prognosis would be poor in the event of a resuscitation event, but is clear in desire to continue full code status. However, he is able to put limitations around  the length of time he would be willing to remain intubated and that is 2 days. If there was no improvement he would wish to be removed from the ventilator.        Length of Stay: 2  Current Medications: Scheduled Meds:   aspirin  325 mg Oral Daily   atorvastatin  40 mg Oral Daily   Chlorhexidine Gluconate Cloth  6 each Topical Q0600   cinacalcet  60 mg Oral Q breakfast   diltiazem  120 mg Oral Daily   docusate sodium  100 mg Oral BID   heparin  5,000 Units Subcutaneous  Q8H   methocarbamol  500 mg Oral TID   metoprolol tartrate  25 mg Oral BID   oxyCODONE  15 mg Oral Q12H   pantoprazole  40 mg Oral Daily   pregabalin  75 mg Oral QHS   rifampin  300 mg Oral Q8H   senna-docusate  1 tablet Oral BID   sevelamer carbonate  1,600 mg Oral TID WC   sodium chloride flush  3 mL Intravenous Q12H   vancomycin variable dose per unstable renal function (pharmacist dosing)   Does not apply See admin instructions     PRN Meds: acetaminophen **OR** acetaminophen, bisacodyl, calcium carbonate (dosed in mg elemental calcium), camphor-menthol **AND** hydrOXYzine, docusate sodium, feeding supplement (NEPRO CARB STEADY), ondansetron **OR** ondansetron (ZOFRAN) IV, polyethylene glycol, sorbitol, zolpidem  Physical Exam Vitals reviewed.  Constitutional:      General: He is not in acute distress. Pulmonary:     Effort: Pulmonary effort is normal.  Musculoskeletal:     Right Lower Extremity: Right leg is amputated above knee.  Neurological:     Mental Status: He is alert and oriented to person, place, and time.            Vital Signs: BP 113/84 (BP Location: Right Arm)   Pulse 82   Temp 97.9 F (36.6 C) (Oral)   Resp 18   Ht 6' (1.829 m)   Wt 67.9 kg   SpO2 96%   BMI 20.30 kg/m  SpO2: SpO2: 96 % O2 Device: O2 Device: Room Air O2 Flow Rate: O2 Flow Rate (L/min): 2 L/min  Intake/output summary:  Intake/Output Summary (Last 24 hours) at 01/25/2021 1817 Last data filed at 01/25/2021  1245 Gross per 24 hour  Intake 963 ml  Output 0 ml  Net 963 ml   LBM: Last BM Date: 01/22/21 Baseline Weight: Weight: 65.5 kg Most recent weight: Weight: 67.9 kg       Palliative Assessment/Data: PPS 40%      Palliative Care Assessment & Plan   HPI/Patient Profile: 50 y.o. 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 He presented to the emergency department on 01/23/2021 with chest pain.  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 He underwent MVR on 10/14/20  and right AKA on 10/29/20. 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 He was recommended to have 6 weeks of vanc/Rifampin treatment (through 7/8) and completed 2 weeks of gentamicin treatment. CT surgery determined that he is not a candidate for redo valve replacement.   Since discharge, he reports what "feels like heartburn" periodically - particularly at HD sessions.  In the ED, high sensitivity troponin 1110. EKG with non-specific ST changes. Lactate 4.8 and WBC 17.5. Patient was admitted to hospitalist service with sepsis and elevated troponin    Assessment: - severe sepsis with recurrent MRSA bacteremia, endocarditis - demand ischemia, chest pain - s/p MVR - s/p right AKA - ESRD on HD - cirrhosis with hepatitis C  Recommendations/Plan: Patient understands poor prognosis but is not ready to stop HD at this time He wishes to continue antibiotics as long as they are offered Goal of care is to return to SNF and have more time  with family, he understands his time is limited and wishes to d/c as soon as possible Code  status remains full code, but he would only want a time trial on a ventilator (2 days) Continue outpatient palliative care at discharge ([facility])  Goals of Care and Additional Recommendations: Limitations on Scope of Treatment: Full Scope Treatment and No Tracheostomy  Code Status: Full   Prognosis:  Poor - patient is high risk to decompensate secondary to his multiple comorbidities.  Discharge Planning: [place] for rehab with Palliative care service follow-up  Care plan was discussed with Dr. Maylene Roes  Thank you for allowing the Palliative Medicine Team to assist in the care of this patient.   Total Time 65 minutes Prolonged Time Billed  yes       Greater than 50%  of this time was spent counseling and coordinating care related to the above assessment and plan.  Lavena Bullion, NP  Please contact Palliative Medicine Team phone at [phone] for questions and concerns.

## 2021-01-25 NOTE — Progress Notes (Signed)
Cardiology Progress Note  Patient ID: Patrick Anthony MRN: [id number] DOB: [birth year] Date of Encounter: 01/25/2021  Primary Cardiologist: Fransico Him, MD  Subjective   Chief Complaint: None.   HPI: Turned down by surgery.  Has a dehisced mitral valve.  Has recurrent endocarditis.  No good options.  Palliative care is the best approach.  ROS:  All other ROS reviewed and negative. Pertinent positives noted in the HPI.     Inpatient Medications  Scheduled Meds:  aspirin  325 mg Oral Daily   atorvastatin  40 mg Oral Daily   Chlorhexidine Gluconate Cloth  6 each Topical Q0600   cinacalcet  60 mg Oral Q breakfast   diltiazem  120 mg Oral Daily   docusate sodium  100 mg Oral BID   heparin  5,000 Units Subcutaneous Q8H   methocarbamol  500 mg Oral TID   metoprolol tartrate  25 mg Oral BID   oxyCODONE  15 mg Oral Q12H   pantoprazole  40 mg Oral Daily   pregabalin  75 mg Oral QHS   rifampin  300 mg Oral Q8H   senna-docusate  1 tablet Oral BID   sevelamer carbonate  1,600 mg Oral TID WC   sodium chloride flush  3 mL Intravenous Q12H   vancomycin variable dose per unstable renal function (pharmacist dosing)   Does not apply See admin instructions   Continuous Infusions:  PRN Meds: acetaminophen **OR** acetaminophen, bisacodyl, calcium carbonate (dosed in mg elemental calcium), camphor-menthol **AND** hydrOXYzine, docusate sodium, feeding supplement (NEPRO CARB STEADY), ondansetron **OR** ondansetron (ZOFRAN) IV, polyethylene glycol, sorbitol, zolpidem   Vital Signs   Vitals:   01/24/21 0802 01/24/21 1153 01/24/21 1916 01/25/21 0546  BP: 95/71 118/90 114/79 110/83  Pulse: 91 92 90 86  Resp: '16 16 18 18  '$ Temp: (!) 97.4 F (36.3 C) (!) 97.4 F (36.3 C) 98.2 F (36.8 C) 97.9 F (36.6 C)  TempSrc: Oral Oral Oral   SpO2: 95% 98% 100% 96%  Weight:    67.9 kg  Height:        Intake/Output Summary (Last 24 hours) at 01/25/2021 0925 Last data filed at 01/25/2021 0819 Gross per  24 hour  Intake 483 ml  Output 0 ml  Net 483 ml   Last 3 Weights 01/25/2021 01/24/2021 01/21/2021  Weight (lbs) 149 lb 11.1 oz 144 lb 6.4 oz 149 lb  Weight (kg) 67.9 kg 65.5 kg 67.586 kg      Telemetry  Overnight telemetry shows sinus rhythm in the 80s, which I personally reviewed.   Physical Exam   Vitals:   01/24/21 0802 01/24/21 1153 01/24/21 1916 01/25/21 0546  BP: 95/71 118/90 114/79 110/83  Pulse: 91 92 90 86  Resp: '16 16 18 18  '$ Temp: (!) 97.4 F (36.3 C) (!) 97.4 F (36.3 C) 98.2 F (36.8 C) 97.9 F (36.6 C)  TempSrc: Oral Oral Oral   SpO2: 95% 98% 100% 96%  Weight:    67.9 kg  Height:        Intake/Output Summary (Last 24 hours) at 01/25/2021 0925 Last data filed at 01/25/2021 0819 Gross per 24 hour  Intake 483 ml  Output 0 ml  Net 483 ml    Last 3 Weights 01/25/2021 01/24/2021 01/21/2021  Weight (lbs) 149 lb 11.1 oz 144 lb 6.4 oz 149 lb  Weight (kg) 67.9 kg 65.5 kg 67.586 kg    Body mass index is 20.3 kg/m.  General: Well nourished, well developed, in no acute distress Head:  Atraumatic, normal size  Eyes: PEERLA, EOMI  Neck: Supple, no JVD Endocrine: No thryomegaly Cardiac: Normal S1, S2; RRR; 3 out of 6 holosystolic murmur Lungs: Clear to auscultation bilaterally, no wheezing, rhonchi or rales  Abd: Soft, nontender, no hepatomegaly  Ext: No edema, pulses 2+, status post right AKA Musculoskeletal: No deformities, BUE and BLE strength normal and equal Skin: Warm and dry, no rashes   Neuro: Alert and oriented to person, place, time, and situation, CNII-XII grossly intact, no focal deficits  Psych: Normal mood and affect   Labs  High Sensitivity Troponin:   Recent Labs  Lab 12/26/20 1019 12/26/20 1214 01/23/21 0920 01/23/21 1119 01/24/21 0416  TROPONINIHS 613* 774* 1,110* 1,159* 1,656*     Cardiac EnzymesNo results for input(s): TROPONINI in the last 168 hours. No results for input(s): TROPIPOC in the last 168 hours.  Chemistry Recent Labs  Lab  01/23/21 0920 01/24/21 0416 01/25/21 0314  NA 133* 133* 130*  K 4.1 3.9 4.0  CL 91* 93* 89*  CO2 '22 24 23  '$ GLUCOSE 85 102* 89  BUN 25* 15 32*  CREATININE 6.32* 4.40* 6.05*  CALCIUM 8.1* 7.8* 7.8*  PROT 8.6*  --   --   ALBUMIN 2.3*  --   --   AST 86*  --   --   ALT 32  --   --   ALKPHOS 82  --   --   BILITOT 3.9*  --   --   GFRNONAA 10* 16* 11*  ANIONGAP 20* 16* 18*    Hematology Recent Labs  Lab 01/23/21 0920 01/24/21 0416 01/25/21 0314  WBC 17.5* 19.7* 15.4*  RBC 3.69* 3.81* 3.71*  HGB 9.4* 9.6* 9.5*  HCT 33.8* 33.4* 32.5*  MCV 91.6 87.7 87.6  MCH 25.5* 25.2* 25.6*  MCHC 27.8* 28.7* 29.2*  RDW 25.4* 25.1* 25.2*  PLT 259 229 205   BNPNo results for input(s): BNP, PROBNP in the last 168 hours.  DDimer No results for input(s): DDIMER in the last 168 hours.   Radiology  CT Angio Chest PE W and/or Wo Contrast  Result Date: 01/23/2021 CLINICAL DATA:  Patient with chest pain. EXAM: CT ANGIOGRAPHY CHEST WITH CONTRAST TECHNIQUE: Multidetector CT imaging of the chest was performed using the standard protocol during bolus administration of intravenous contrast. Multiplanar CT image reconstructions and MIPs were obtained to evaluate the vascular anatomy. CONTRAST:  28m OMNIPAQUE IOHEXOL 350 MG/ML SOLN COMPARISON:  CT chest 09/29/2020. FINDINGS: Cardiovascular: Heart is enlarged, particularly right heart. There is reflux of contrast into the hepatic veins. Small pericardial effusion. Thoracic aortic vascular calcifications. Adequate opacification of the pulmonary arteries. Motion artifact limits evaluation of the distal pulmonary arteries. Within the above limitation, no evidence for acute pulmonary embolus. Mediastinum/Nodes: No enlarged axillary, mediastinal or hilar lymphadenopathy. Normal appearance of the esophagus. Lungs/Pleura: Central airways are patent. There are small bilateral pleural effusions. The right pleural effusion appears partially loculated. Bandlike patchy  ground-glass and consolidative opacities throughout the lungs bilaterally. Upper Abdomen: Unremarkable. Musculoskeletal: Thoracic spine degenerative changes. There is osseous destruction of the superior endplate of the T624THLvertebral body. There is loss of disc space height at the T10-T11 level. This is most compatible with discitis osteomyelitis is demonstrated on prior thoracic spine MRI 12/25/2020. Review of the MIP images confirms the above findings. IMPRESSION: 1. No evidence for acute pulmonary embolus. 2. Bilateral pleural effusions. The right pleural effusion appears to be partially loculated. There are scattered bandlike consolidative opacities which may represent atelectasis  and or infection. 3. T10-T11 discitis osteomyelitis as demonstrated on prior MRI of the thoracic spine 12/25/2020. 4. The right heart is enlarged. Secondary findings suggestive of right heart failure including reflux of contrast into the hepatic veins. 5. Aortic Atherosclerosis (ICD10-I70.0). Electronically Signed   By: Lovey Newcomer M.D.   On: 01/23/2021 12:45   DG Chest Portable 1 View  Result Date: 01/23/2021 CLINICAL DATA:  Chest pain. EXAM: PORTABLE CHEST 1 VIEW COMPARISON:  Prior chest radiographs 12/22/2020 and earlier. FINDINGS: Cardiomegaly. Prior cardiac valve replacement. Central pulmonary vascular congestion. Patchy bilateral airspace opacities. Redemonstrated architectural distortion within the right mid lung. Right greater than left pleural effusions, increased from the prior chest radiograph of 12/22/2020. The right pleural effusion may be loculated. No evidence of pneumothorax. No acute bony abnormality identified. A vascular stent projects in the left subclavicular region. IMPRESSION: Cardiomegaly with pulmonary vascular congestion. Patchy bilateral pulmonary opacities have progressed as compared to the chest radiograph of 12/22/2020 and are favored to reflect pulmonary edema. Pneumonia considered less likely,  although not excluded. Progressive right greater than left pleural effusions. The right pleural effusion may be loculated. Redemonstrated architectural distortion within the right mid lung. Electronically Signed   By: Kellie Simmering DO   On: 01/23/2021 10:26   ECHOCARDIOGRAM COMPLETE  Result Date: 01/24/2021    ECHOCARDIOGRAM REPORT   Patient Name:   HARVIR CASTLEBERRY Date of Exam: 01/24/2021 Medical Rec #:  [id number]      Height:       72.0 in Accession #:    [id number]     Weight:       144.4 lb Date of Birth:  1970/08/21      BSA:          1.855 m Patient Age:    51 years       BP:           118/90 mmHg Patient Gender: M              HR:           92 bpm. Exam Location:  Inpatient Procedure: 2D Echo, 3D Echo, Cardiac Doppler and Color Doppler Indications:    NSTEMI  History:        Patient has prior history of Echocardiogram examinations, most                 recent 12/24/2020. Risk Factors:Hypertension. S/P PFO closure.                 S/P MVR (31 mm [employer]onic bioprosthetic valve present in the                 mitral position. ESRD.                  Mitral Valve: 31 mm Mosaic bioprosthetic valve valve is present                 in the mitral position.  Sonographer:    Clayton Lefort RDCS (AE) Referring Phys: [id number] [place]  1. Left ventricular ejection fraction, by estimation, is 60 to 65%. The left ventricle has normal function. The left ventricle has no regional wall motion abnormalities. There is severe left ventricular hypertrophy. Left ventricular diastolic parameters  are indeterminate.  2. Right ventricular systolic function is normal. The right ventricular size is mildly enlarged. There is moderately elevated pulmonary artery systolic pressure. The estimated right ventricular systolic pressure is 0000000 mmHg.  3. Right atrial  size was severely dilated.  4. 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 The mitral valve has been repaired/replaced. Trivial mitral valve regurgitation. Severe mitral stenosis. The mean mitral valve gradient is 12.0 mmHg. There is a 31 mm Mosaic bioprosthetic valve present in the mitral position. Echo findings are consistent with rocking, dehiscence, stenosis and fracture/perforation the mitral prosthesis.  5. Eccentric TR directed at the intraventricular septum. The tricuspid valve is abnormal. Tricuspid valve regurgitation is moderate to severe.  6. The aortic valve is tricuspid. Aortic valve regurgitation is not visualized. Mild aortic valve sclerosis is present, with no evidence of aortic valve stenosis. Aortic valve mean gradient measures 3.0 mmHg.  7. The inferior vena cava is dilated in size with <50% respiratory variability, suggesting right atrial pressure of 15 mmHg. Comparison(s): Changes from prior study are noted. 12/24/2020: (TEE) - LVEF 60-65%, mitral valve prosthesis with vegetation noted. I previous performed the TEE - there has been significant progression of endocarditis of the valve with new valve instability that was not previously noted on 12/24/20. Conclusion(s)/Recommendation(s): Critical findings reported to Dr. Audie Box and acknowledged at 12:30 pm. FINDINGS  Left Ventricle: Left ventricular ejection fraction, by estimation, is 60 to 65%. The left ventricle has normal function. The left ventricle has no regional wall motion abnormalities. The left ventricular internal cavity size was normal in size. There is  severe left ventricular hypertrophy. Left ventricular diastolic function could not be evaluated due to mitral valve replacement. Left ventricular diastolic parameters  are indeterminate. Right Ventricle: The right ventricular size is mildly enlarged. No increase in right ventricular wall thickness. Right ventricular systolic function is normal. There is moderately elevated pulmonary artery systolic pressure. The tricuspid regurgitant velocity is 3.31 m/s, and with an assumed right atrial pressure of 15 mmHg, the estimated right ventricular systolic pressure is 0000000 mmHg. Left Atrium: Left atrial size was normal in size. Right Atrium: Right atrial size was severely dilated. Pericardium: There is no evidence of pericardial effusion. 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 The mitral valve has been repaired/replaced. Trivial mitral valve regurgitation. There is a 31 mm Mosaic bioprosthetic valve present in the mitral position. Echo findings are consistent with abnormal rocking of, dehiscence of, stenosis of and fracture/perforation of the mitral prosthesis. Severe mitral valve stenosis. MV peak gradient, 22.1 mmHg. The mean mitral valve gradient is 12.0 mmHg with average heart rate of 91 bpm. Tricuspid Valve: Eccentric TR directed at the intraventricular septum. The tricuspid valve is abnormal. Tricuspid valve regurgitation is moderate to severe. Aortic Valve: The aortic valve is tricuspid. Aortic valve regurgitation is not visualized. Mild aortic valve sclerosis is present, with no evidence of aortic valve stenosis. Aortic valve mean gradient measures 3.0 mmHg. Aortic valve peak gradient measures 7.7 mmHg. Aortic valve area, by  VTI measures 1.61 cm. Pulmonic Valve: The pulmonic valve was  grossly normal. Pulmonic valve regurgitation is trivial. Aorta: The aortic root and ascending aorta are structurally normal, with no evidence of dilitation. Venous: The inferior vena cava is dilated in size with less than 50% respiratory variability, suggesting right atrial pressure of 15 mmHg. IAS/Shunts: No atrial level shunt detected by color flow Doppler.  LEFT VENTRICLE PLAX 2D LVIDd:         3.90 cm LVIDs:         2.80 cm LV PW:         1.70 cm LV IVS:        1.70 cm LVOT diam:     1.90 cm LV SV:         31 LV SV Index:   17 LVOT Area:     2.84 cm  RIGHT VENTRICLE             IVC RV Basal diam:  3.90 cm     IVC diam: 2.30 cm RV Mid diam:    3.30 cm RV S prime:     10.30 cm/s TAPSE (M-mode): 1.6 cm LEFT ATRIUM             Index       RIGHT ATRIUM           Index LA diam:        4.60 cm 2.48 cm/m  RA Area:     27.50 cm LA Vol (A2C):   56.2 ml 30.29 ml/m RA Volume:   96.70 ml  52.13 ml/m LA Vol (A4C):   47.0 ml 25.34 ml/m LA Biplane Vol: 52.8 ml 28.46 ml/m  AORTIC VALVE AV Area (Vmax):    1.45 cm AV Area (Vmean):   1.38 cm AV Area (VTI):     1.61 cm AV Vmax:           139.00 cm/s AV Vmean:          85.000 cm/s AV VTI:            0.192 m AV Peak Grad:      7.7 mmHg AV Mean Grad:      3.0 mmHg LVOT Vmax:         70.90 cm/s LVOT Vmean:        41.500 cm/s LVOT VTI:          0.109 m LVOT/AV VTI ratio: 0.57  AORTA Ao Root diam: 3.70 cm Ao Asc diam:  3.70 cm MITRAL VALVE             TRICUSPID VALVE MV Area VTI:  0.59 cm   TR Peak grad:   43.8 mmHg MV Peak grad: 22.1 mmHg  TR Vmax:        331.00 cm/s MV Mean grad: 12.0 mmHg MV Vmax:      2.35 m/s   SHUNTS MV Vmean:     170.0 cm/s Systemic VTI:  0.11 m                          Systemic Diam: 1.90 cm Lyman Bishop MD Electronically signed by Lyman Bishop MD Signature Date/Time: 01/24/2021/12:32:23 PM    Final     Cardiac Studies  TTE 01/24/2021  1. Left ventricular ejection fraction, by estimation, is 60 to 65%. The  left ventricle has normal function. The  left ventricle has no regional  wall motion abnormalities. There is severe left ventricular hypertrophy.  Left ventricular diastolic parameters   are indeterminate.   2. Right ventricular systolic function is normal. The right ventricular  size is mildly enlarged. There is moderately elevated pulmonary artery  systolic pressure. The estimated right ventricular systolic pressure is  0000000 mmHg.   3. Right atrial size was severely dilated.   4. 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 The mitral valve has been  repaired/replaced. Trivial mitral valve  regurgitation. Severe mitral stenosis. The mean mitral valve gradient is  12.0 mmHg. There is a 31 mm Mosaic bioprosthetic valve present in the  mitral position. Echo findings are consistent with rocking, dehiscence,  stenosis and fracture/perforation the  mitral prosthesis.   5. Eccentric TR directed at the intraventricular septum. The tricuspid  valve is abnormal. Tricuspid valve regurgitation is moderate to severe.   6. The aortic valve is tricuspid. Aortic valve regurgitation is not  visualized. Mild aortic valve sclerosis is present, with no evidence of  aortic valve stenosis. Aortic valve mean gradient measures 3.0 mmHg.   7. The inferior vena cava is dilated in size with <50% respiratory  variability, suggesting right atrial pressure of 15 mmHg.   Patient Profile  Trevarius Taul is a 50 y.o. 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  Cardiology was consulted.  Assessment & Plan   Chest pain/elevated troponin/lactic acidosis -Known bioprosthetic mitral valve endocarditis.  Now with dehiscence of the mitral valve.  Has severe MR from what I suspect.  Gradients are high across the valve due to MR.  He has a murmur of MR. -I suspect his chest pain and troponin elevation was related to the dehisced valve.  This is not related to obstructive CAD.  He had a left heart catheterization in March 2020 that showed normal coronary arteries. -For now we will just continue aspirin and statin.  No heparin.  See discussion on mitral valve endocarditis below.  2.  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  He has a dehiscence of the mitral valve.  He has severe mitral regurgitation.  He has been evaluated by surgery.  He is not a candidate for redo mitral valve surgery.  There is no medical option for this. -I had a long discussion with Mr. Chezem about this.  I have recommended palliative care and hospice.  He understands that he has no options and he will likely succumb to this illness. -Would discontinue antibiotics for now.  Cardiology work-up has been completed.   3.  Atrial tachycardia versus atrial flutter -No documented A. fib.  I suspect he is just having atrial tachycardia.  We will continue diltiazem.  [facility] will sign off.   Medication Recommendations: Hospice and palliative care as detailed above Other recommendations (labs, testing, etc): None Follow up as an outpatient: None needed  For questions or  updates, please contact [place] Please consult [email or web address] for contact info under    Time Spent with  Patient: I have spent a total of 35 minutes with patient reviewing hospital notes, telemetry, EKGs, labs and examining the patient as well as establishing an assessment and plan that was discussed with the patient.  > 50% of time was spent in direct patient care.    Signed, Addison Naegeli. Audie Box, MD, [place]  01/25/2021 9:25 AM

## 2021-01-25 NOTE — Progress Notes (Signed)
PROGRESS NOTE    Patrick Anthony  [id number] DOB: [birth year] DOA: 01/23/2021 PCP: [name], MD     Brief Narrative:  Patrick Anthony is a 50 y.o. 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  He was recommended to have 6 weeks of Vanc/Rifampin treatment (through 7/8) and completed 2 weeks of gentamicin treatment.  CT surgery determined that he is not a candidate for redo valve replacement.  He reports since d/c he has noticed what "feels like heartburn" periodically - particularly at HD sessions.  He noticed it today and it lasted a long time so he decided to come in for evaluation.  He is noticing some SOB, including PND and periodic orthopnea.  No fever or cough.  Patient was admitted for chest pain, cardiology, infectious disease, nephrology also consulted.  Echocardiogram revealed rocking, dehiscence, stenosis and fracture/perforation the mitral prosthesis.  Cardiothoracic surgery was consulted again, unfortunately patient is not a candidate for redo valve replacement.  Multiple providers have recommended palliative care/hospice.  New events last 24 hours / Subjective: Patient without any physical complaints today.  I reviewed with him cardiothoracic surgery's recommendation from yesterday, reviewed palliative care and hospice.  He was not ready to make any decisions regarding stopping dialysis.  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  He is status post mitral valve replacement, right AKA. -Infectious disease following. Continue vancomycin, rifampin for now until goals of care are established -Echocardiogram revealed rocking, dehiscence, stenosis and fracture/perforation the mitral prosthesis.  -Per CT surgery, patient not a candidate for redo surgery -Palliative care following for goals of care conversation  Demand ischemia Chest pain -Echocardiogram as below  -Cardiology consulted, suspected to be demand ischemia in setting of underlying infectious process, metabolic derangement -Continue aspirin, statin -Cardiology signed off 6/21 no further cardiac work-up planned  Atrial tachycardia  -Continue cardizem, lopressor   ESRD -HD MWF -Nephrology following  Cirrhosis with hepatitis C -Will need outpatient follow-up, depending on goals of care conversation    DVT prophylaxis:  heparin injection 5,000 Units Start: 01/23/21 1400  Code Status:     Code Status Orders  (From admission, onward)           Start     Ordered   01/23/21 1333  Full code  Continuous        01/23/21 1336           Code Status History     Date Active Date Inactive Code Status Order ID Comments User Context   12/22/2020 1833 01/07/2021 0343 Full Code [id number]  Norval Morton, MD ED   09/28/2020  1042 11/11/2020 0303 Full Code [id number]  Oswald Hillock, MD Inpatient   09/27/2020 1808 09/28/2020 1042 Full Code [id number]  Allie Dimmer, MD ED   10/30/2019 1259 10/31/2019 1704 Full Code [id number]  Marval Regal Inpatient   04/20/2013 0234 04/24/2013 1650 Full Code [id number]  Ivor Costa, MD Inpatient      Family Communication: None at bedside  Disposition Plan:  Status is: Inpatient  Remains inpatient appropriate because:Inpatient level of care appropriate due to severity of illness  Dispo: The patient is from: Home              Anticipated d/c is to: Home              Patient currently is not medically stable to d/c.   Difficult to place patient No      Consultants:  Infectious disease Cardiology Nephrology Cardiothoracic surgery Palliative care medicine  Antimicrobials:  Anti-infectives (From admission, onward)    Start     Dose/Rate Route Frequency Ordered Stop   01/24/21 1318  vancomycin variable dose per unstable renal function (pharmacist dosing)         Does not apply See admin instructions 01/24/21 1318     01/24/21 1200  vancomycin (VANCOREADY) IVPB 750 mg/150 mL  Status:  Discontinued        750 mg 150 mL/hr over 60 Minutes Intravenous Every M-W-F (Hemodialysis) 01/23/21 1134 01/24/21 0733   01/24/21 0830  vancomycin (VANCOREADY) IVPB 750 mg/150 mL        750 mg 150 mL/hr over 60 Minutes Intravenous STAT 01/24/21 0734 01/24/21 0950   01/23/21 2115  vancomycin (VANCOREADY) IVPB 750 mg/150 mL  Status:  Discontinued        750 mg 150 mL/hr over 60 Minutes Intravenous  Once 01/23/21 2103 01/24/21 0733   01/23/21 1500  rifampin (RIFADIN) capsule 300 mg       Note to Pharmacy: 6 weeks from 5/27     300 mg Oral Every 8 hours 01/23/21 1336     01/23/21 1130  ceFEPIme (MAXIPIME) 2 g in sodium chloride 0.9 % 100 mL IVPB        2 g 200 mL/hr over 30 Minutes Intravenous  Once 01/23/21 1122 01/23/21 1305        Objective: Vitals:   01/24/21 1153 01/24/21 1916 01/25/21 0546 01/25/21 1206  BP: 118/90 114/79 110/83 108/77  Pulse: 92 90 86 83  Resp: '16 18 18 18  '$ Temp: (!) 97.4 F (36.3 C) 98.2 F (36.8 C) 97.9 F (36.6 C) 97.9 F (36.6 C)  TempSrc: Oral  Oral  Oral  SpO2: 98% 100% 96% 98%  Weight:   67.9 kg   Height:        Intake/Output Summary (Last 24 hours) at 01/25/2021 1237 Last data filed at 01/25/2021 [id number] Gross per 24 hour  Intake 723 ml  Output 0 ml  Net 723 ml    Filed Weights   01/24/21 0143 01/25/21 0546  Weight: 65.5 kg 67.9 kg   Examination: General exam: Appears calm and comfortable  Respiratory system: Clear to auscultation. Respiratory effort normal. Cardiovascular system: S1 & S2 heard, RRR. + Murmur.  No pedal edema. Gastrointestinal system: Abdomen is nondistended, soft and nontender. Normal bowel sounds heard. Central nervous system: Alert and oriented. Non focal exam. Speech clear  Extremities: Left AKA Skin: No rashes, lesions or ulcers on exposed skin  Psychiatry: Judgement and insight appear stable. Mood & affect appropriate.    Data  Reviewed: I have personally reviewed following labs and imaging studies  CBC: Recent Labs  Lab 01/23/21 0920 01/24/21 0416 01/25/21 0314  WBC 17.5* 19.7* 15.4*  NEUTROABS 14.6*  --   --   HGB 9.4* 9.6* 9.5*  HCT 33.8* 33.4* 32.5*  MCV 91.6 87.7 87.6  PLT 259 229 [id number]    Basic Metabolic Panel: Recent Labs  Lab 01/23/21 0920 01/24/21 0416 01/25/21 0314  NA 133* 133* 130*  K 4.1 3.9 4.0  CL 91* 93* 89*  CO2 '22 24 23  '$ GLUCOSE 85 102* 89  BUN 25* 15 32*  CREATININE 6.32* 4.40* 6.05*  CALCIUM 8.1* 7.8* 7.8*    GFR: Estimated Creatinine Clearance: 14.2 mL/min (A) (by C-G formula based on SCr of 6.05 mg/dL (H)). Liver Function Tests: Recent Labs  Lab 01/23/21 0920  AST 86*  ALT 32  ALKPHOS 82  BILITOT 3.9*  PROT 8.6*  ALBUMIN 2.3*    No results for input(s): LIPASE, AMYLASE in the last 168 hours. No results for input(s): AMMONIA in the last 168 hours. Coagulation Profile: Recent Labs  Lab 01/23/21 0920  INR 1.6*    Cardiac Enzymes: No results for input(s): CKTOTAL, CKMB, CKMBINDEX, TROPONINI in the last 168 hours. BNP (last 3  results) No results for input(s): PROBNP in the last 8760 hours. HbA1C: No results for input(s): HGBA1C in the last 72 hours. CBG: No results for input(s): GLUCAP in the last 168 hours. Lipid Profile: No results for input(s): CHOL, HDL, LDLCALC, TRIG, CHOLHDL, LDLDIRECT in the last 72 hours. Thyroid Function Tests: No results for input(s): TSH, T4TOTAL, FREET4, T3FREE, THYROIDAB in the last 72 hours. Anemia Panel: No results for input(s): VITAMINB12, FOLATE, FERRITIN, TIBC, IRON, RETICCTPCT in the last 72 hours. Sepsis Labs: Recent Labs  Lab 01/23/21 0901 01/23/21 1119 01/24/21 0416  PROCALCITON  --   --  1.50  LATICACIDVEN 4.8* 4.4* 4.2*     Recent Results (from the past 240 hour(s))  Blood Culture (routine x 2)     Status: None (Preliminary result)   Collection Time: 01/23/21  4:20 AM   Specimen: BLOOD  Result Value Ref Range Status   Specimen Description BLOOD SITE NOT SPECIFIED  Final   Special Requests   Final    BOTTLES DRAWN AEROBIC ONLY Blood Culture results may not be optimal due to an inadequate volume of blood received in culture bottles   Culture  Setup Time   Final    GRAM POSITIVE COCCI IN CLUSTERS AEROBIC BOTTLE ONLY CRITICAL VALUE NOTED.  VALUE IS CONSISTENT WITH PREVIOUSLY REPORTED AND CALLED VALUE. Performed at [facility], [place] [address] [place], [place] [address]    Culture GRAM POSITIVE COCCI  Final   Report Status PENDING  Incomplete  Resp Panel by RT-PCR (Flu A&B, Covid) Nasopharyngeal Swab     Status: None   Collection Time: 01/23/21 11:19 AM   Specimen: Nasopharyngeal Swab; Nasopharyngeal(NP) swabs in vial transport medium  Result Value Ref Range Status   SARS Coronavirus 2 by RT PCR NEGATIVE NEGATIVE Final    Comment: (NOTE) SARS-CoV-2 target nucleic acids are NOT DETECTED.  The SARS-CoV-2 RNA is generally detectable in upper respiratory specimens during the acute phase of infection. The lowest concentration of SARS-CoV-2 viral copies  this assay can detect is 138 copies/mL. A negative result does not preclude SARS-Cov-2 infection and should not be used as the sole basis for treatment or other patient management decisions. A negative result may occur with  improper  specimen collection/handling, submission of specimen other than nasopharyngeal swab, presence of viral mutation(s) within the areas targeted by this assay, and inadequate number of viral copies(<138 copies/mL). A negative result must be combined with clinical observations, patient history, and epidemiological information. The expected result is Negative.  Fact Sheet for Patients:  EntrepreneurPulse.com.au  Fact Sheet for Healthcare Providers:  IncredibleEmployment.be  This test is no t yet approved or cleared by the [place] FDA and  has been authorized for detection and/or diagnosis of SARS-CoV-2 by FDA under an Emergency Use Authorization (EUA). This EUA will remain  in effect (meaning this test can be used) for the duration of the COVID-19 declaration under Section 564(b)(1) of the Act, 21 U.S.C.section 360bbb-3(b)(1), unless the authorization is terminated  or revoked sooner.       Influenza A by PCR NEGATIVE NEGATIVE Final   Influenza B by PCR NEGATIVE NEGATIVE Final    Comment: (NOTE) The Xpert Xpress SARS-CoV-2/FLU/RSV plus assay is intended as an aid in the diagnosis of influenza from Nasopharyngeal swab specimens and should not be used as a sole basis for treatment. Nasal washings and aspirates are unacceptable for Xpert Xpress SARS-CoV-2/FLU/RSV testing.  Fact Sheet for Patients: EntrepreneurPulse.com.au  Fact Sheet for Healthcare Providers: IncredibleEmployment.be  This test is not yet approved or cleared by the [place] FDA and has been authorized for detection and/or diagnosis of SARS-CoV-2 by FDA under an Emergency Use Authorization (EUA). This EUA  will remain in effect (meaning this test can be used) for the duration of the COVID-19 declaration under Section 564(b)(1) of the Act, 21 U.S.C. section 360bbb-3(b)(1), unless the authorization is terminated or revoked.  Performed at [facility], [place] [address], [place], [place] [address]        Radiology Studies: ECHOCARDIOGRAM COMPLETE  Result Date: 01/24/2021    ECHOCARDIOGRAM REPORT   Patient Name:   Patrick Anthony Date of Exam: 01/24/2021 Medical Rec #:  [id number]      Height:       72.0 in Accession #:    [id number]     Weight:       144.4 lb Date of Birth:  Dec 27, 1970      BSA:          1.855 m Patient Age:    42 years       BP:           118/90 mmHg Patient Gender: M              HR:           92 bpm. Exam Location:  Inpatient Procedure: 2D Echo, 3D Echo, Cardiac Doppler and Color Doppler Indications:    NSTEMI  History:        Patient has prior history of Echocardiogram examinations, most                 recent 12/24/2020. Risk Factors:Hypertension. S/P PFO closure.                 S/P MVR (31 mm Medtronic bioprosthetic valve present in the                 mitral position. ESRD.                  Mitral Valve: 31 mm Mosaic bioprosthetic valve valve is present                 in the mitral position.  Sonographer:  Clayton Lefort RDCS (AE) Referring Phys: [id number] [place]  1. Left ventricular ejection fraction, by estimation, is 60 to 65%. The left ventricle has normal function. The left ventricle has no regional wall motion abnormalities. There is severe left ventricular hypertrophy. Left ventricular diastolic parameters  are indeterminate.  2. Right ventricular systolic function is normal. The right ventricular size is mildly enlarged. There is moderately elevated pulmonary artery systolic pressure. The estimated right ventricular systolic pressure is 0000000 mmHg.  3. Right atrial size was severely dilated.  4. 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 The mitral valve has been repaired/replaced. Trivial mitral valve regurgitation. Severe mitral stenosis. The mean mitral valve gradient is 12.0 mmHg. There is a 31 mm Mosaic bioprosthetic valve present in the mitral position. Echo findings are consistent with rocking, dehiscence, stenosis and fracture/perforation the mitral prosthesis.  5. Eccentric TR directed at the intraventricular septum. The tricuspid valve is abnormal. Tricuspid valve regurgitation is moderate to severe.  6. The aortic valve is tricuspid. Aortic valve regurgitation is not visualized. Mild aortic valve sclerosis is present, with no evidence of aortic valve stenosis. Aortic valve mean gradient measures 3.0 mmHg.  7. The inferior vena cava is dilated in size with <50% respiratory variability, suggesting right atrial pressure of 15 mmHg. Comparison(s): Changes from prior study are noted. 12/24/2020: (TEE) - LVEF 60-65%, mitral valve prosthesis with vegetation noted. I previous performed the TEE - there has been significant progression of endocarditis of the valve with new valve instability that was not previously noted on 12/24/20. Conclusion(s)/Recommendation(s): Critical findings reported to Dr. Audie Box and acknowledged at 12:30 pm. FINDINGS  Left Ventricle: Left ventricular ejection fraction, by estimation, is 60 to 65%. The left ventricle has normal function. The left ventricle has no regional wall motion abnormalities. The left ventricular internal cavity size was normal in size. There is  severe left ventricular hypertrophy. Left ventricular diastolic function could not be  evaluated due to mitral valve replacement. Left ventricular diastolic parameters are indeterminate. Right Ventricle: The right ventricular size is mildly enlarged. No increase in right ventricular wall thickness. Right ventricular systolic function is normal. There is moderately elevated pulmonary artery systolic pressure. The tricuspid regurgitant velocity is 3.31 m/s, and with an assumed right atrial pressure of 15 mmHg, the estimated right ventricular systolic pressure is 0000000 mmHg. Left Atrium: Left atrial size was normal in size. Right Atrium: Right atrial size was severely dilated. Pericardium: There is no evidence of pericardial effusion. 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 The mitral valve has been repaired/replaced. Trivial mitral valve regurgitation. There is a 31 mm Mosaic bioprosthetic valve present in the mitral position. Echo findings are consistent with abnormal rocking of, dehiscence of, stenosis of and fracture/perforation of the mitral prosthesis. Severe mitral  valve stenosis. MV peak gradient, 22.1 mmHg. The mean mitral valve gradient is 12.0 mmHg with average heart rate of 91 bpm. Tricuspid Valve: Eccentric TR directed at the intraventricular septum. The tricuspid valve is abnormal. Tricuspid valve regurgitation is moderate to severe. Aortic Valve: The aortic valve is tricuspid. Aortic valve regurgitation is not visualized. Mild aortic valve sclerosis is present, with no evidence of aortic valve stenosis. Aortic valve mean gradient measures 3.0 mmHg. Aortic valve peak gradient measures 7.7 mmHg.  Aortic valve area, by VTI measures 1.61 cm. Pulmonic Valve: The pulmonic valve was grossly normal. Pulmonic valve regurgitation is trivial. Aorta: The aortic root and ascending aorta are structurally normal, with no evidence of dilitation. Venous: The inferior vena cava is dilated in size with less than 50% respiratory variability, suggesting right atrial pressure of 15 mmHg. IAS/Shunts: No atrial level shunt detected by color flow Doppler.  LEFT VENTRICLE PLAX 2D LVIDd:         3.90 cm LVIDs:         2.80 cm LV PW:         1.70 cm LV IVS:        1.70 cm LVOT diam:     1.90 cm LV SV:         31 LV SV Index:   17 LVOT Area:     2.84 cm  RIGHT VENTRICLE             IVC RV Basal diam:  3.90 cm     IVC diam: 2.30 cm RV Mid diam:    3.30 cm RV S prime:     10.30 cm/s TAPSE (M-mode): 1.6 cm LEFT ATRIUM             Index       RIGHT ATRIUM           Index LA diam:        4.60 cm 2.48 cm/m  RA Area:     27.50 cm LA Vol (A2C):   56.2 ml 30.29 ml/m RA Volume:   96.70 ml  52.13 ml/m LA Vol (A4C):   47.0 ml 25.34 ml/m LA Biplane Vol: 52.8 ml 28.46 ml/m  AORTIC VALVE AV Area (Vmax):    1.45 cm AV Area (Vmean):   1.38 cm AV Area (VTI):     1.61 cm AV Vmax:           139.00 cm/s AV Vmean:          85.000 cm/s AV VTI:            0.192 m AV Peak Grad:      7.7 mmHg AV Mean Grad:      3.0 mmHg LVOT Vmax:         70.90 cm/s LVOT Vmean:        41.500 cm/s LVOT VTI:          0.109 m LVOT/AV VTI ratio: 0.57  AORTA Ao Root diam: 3.70 cm Ao Asc diam:  3.70 cm MITRAL VALVE             TRICUSPID VALVE MV Area VTI:  0.59 cm   TR Peak grad:   43.8 mmHg MV Peak grad: 22.1 mmHg  TR Vmax:        331.00 cm/s MV Mean grad: 12.0 mmHg MV Vmax:      2.35 m/s   SHUNTS MV Vmean:     170.0 cm/s Systemic VTI:  0.11 m  Systemic Diam: 1.90 cm Lyman Bishop MD Electronically signed by Lyman Bishop MD Signature Date/Time: 01/24/2021/12:32:23 PM    Final       Scheduled Meds:  aspirin  325 mg Oral Daily   atorvastatin   40 mg Oral Daily   Chlorhexidine Gluconate Cloth  6 each Topical Q0600   cinacalcet  60 mg Oral Q breakfast   diltiazem  120 mg Oral Daily   docusate sodium  100 mg Oral BID   heparin  5,000 Units Subcutaneous Q8H   methocarbamol  500 mg Oral TID   metoprolol tartrate  25 mg Oral BID   oxyCODONE  15 mg Oral Q12H   pantoprazole  40 mg Oral Daily   pregabalin  75 mg Oral QHS   rifampin  300 mg Oral Q8H   senna-docusate  1 tablet Oral BID   sevelamer carbonate  1,600 mg Oral TID WC   sodium chloride flush  3 mL Intravenous Q12H   vancomycin variable dose per unstable renal function (pharmacist dosing)   Does not apply See admin instructions   Continuous Infusions:   LOS: 2 days      Time spent: 25 minutes   Dessa Phi, DO [facility] 01/25/2021, 12:37 PM   Available via Epic secure chat 7am-7pm After these hours, please refer to coverage provider listed on [email or web address]

## 2021-01-25 NOTE — Progress Notes (Signed)
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, and native AV IE, recent BKA for OM, CVAs from septic emboli admitted with sepsis and chest pain  Subjective:  TTE shows progressive on MV and AV IE, Seen by TCTS not surgical candidate Hospice recommended Pt having trouble with idea of stopping HD, feels like a more 'active' step To See palliative  06/20 0701 - 06/21 0700 In: 480 [P.O.:480] Out: 0   Filed Weights   01/24/21 0143 01/25/21 0546  Weight: 65.5 kg 67.9 kg    Scheduled Meds:  aspirin  325 mg Oral Daily   atorvastatin  40 mg Oral Daily   Chlorhexidine Gluconate Cloth  6 each Topical Q0600   cinacalcet  60 mg Oral Q breakfast   diltiazem  120 mg Oral Daily   docusate sodium  100 mg Oral BID   heparin  5,000 Units Subcutaneous Q8H   methocarbamol  500 mg Oral TID   metoprolol tartrate  25 mg Oral BID   oxyCODONE  15 mg Oral Q12H   pantoprazole  40 mg Oral Daily   pregabalin  75 mg Oral QHS   rifampin  300 mg Oral Q8H   senna-docusate  1 tablet Oral BID   sevelamer carbonate  1,600 mg Oral TID WC   sodium chloride flush  3 mL Intravenous Q12H   vancomycin variable dose per unstable renal function (pharmacist dosing)   Does not apply See admin instructions   Continuous Infusions: PRN Meds:.acetaminophen **OR** acetaminophen, bisacodyl, calcium carbonate (dosed in mg elemental calcium), camphor-menthol **AND** hydrOXYzine, docusate sodium, feeding supplement (NEPRO CARB STEADY), ondansetron **OR** ondansetron (ZOFRAN) IV, polyethylene glycol, sorbitol, zolpidem  Current Labs: reviewed    Physical Exam:  Blood pressure 110/83, pulse 86, temperature 97.9 F (36.6 C), resp. rate 18, height 6' (1.829 m), weight 67.9 kg, SpO2 96 %. NAD,c hronically ill appearing NCAT RRR, 3/6 MSM CTAB LUE AVG +B, bandaged S/ntn/d R BKA stump LLE no edema  Dialysis Orders:  MWF at [facility] 4hr, 350/500, EDW 67kg,  2K/2Ca, UFP#1, AVG, heparin 5000 - Mircera 230mg Iv q 2 weeks (last '150mg'$  on 6/6) - No VDRA - Has been getting Vanc '750mg'$  x HD, does not appear to have missed any. Vanc trough 17.3 on 01/17/21.   Assessment/Plan: Progressive MV and AV IE while on ABX, not a surgical candidate, hospice recommended. ESRD:  Pt struggling with stopping HD, to see palliative, No immediate HD needs, f/u with pt after palliative eval  Hypertension/volume: improved from admit  Anemia: Hgb 9s  rece ESA [id number] Metabolic bone disease: Ca ok, cont binders  Nutrition:  Adding protein supplements.  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 Morphine should not be used.  Baclofen should be avoided Avoid oral sodium phosphate and magnesium citrate based laxatives / bowel preps    RPearson GrippeMD 01/25/2021, 10:02 AM  Recent Labs  Lab 01/23/21 0920 01/24/21 0416 01/25/21 0314  NA 133* 133* 130*  K 4.1 3.9 4.0  CL 91* 93* 89*  CO2 '22 24 23  '$ GLUCOSE 85 102* 89  BUN 25* 15 32*  CREATININE 6.32* 4.40* 6.05*  CALCIUM 8.1* 7.8* 7.8*    Recent Labs  Lab 01/23/21 0920 01/24/21 0416 01/25/21 0314  WBC 17.5* 19.7* 15.4*  NEUTROABS 14.6*  --   --   HGB 9.4* 9.6*  9.5*  HCT 33.8* 33.4* 32.5*  MCV 91.6 87.7 87.6  PLT 259 229 205

## 2021-01-25 NOTE — Progress Notes (Signed)
[facility]  Date of Admission:  01/23/2021      Total days of antibiotics   Vancomycin post HD + TID Rifampin PO           ASSESSMENT: Unfortunately Patrick Anthony has no surgical options for his progressive prosthetic valve endocarditis as he would not be expected to survive a complicated surgery. Currently his condition is not likely to be survivable long term. He seems to be struggling with decision to completely move towards hospice care, which would include stopping HD; largest concern a/w stopping HD is the pain that he experiences with holding sessions. He is not sure about continuing antibiotics or not. Awaiting palliative medicine input and support to help Patrick Anthony navigate tough decisions in his care moving forward. Reasonable to continue current antibiotics (IV or oral doxy) to allow him time to reach decisions and make necessary arrangements.     PLAN: Follow for PM discussions and recommendations  Reasonable to continue antibiotics for now to try to allow for time to make arrangements/decisions     Principal Problem:   Prosthetic valve endocarditis ([place]) Active Problems:   ESRD on dialysis (HCC)   Lactic acidosis   S/P mitral valve replacement   S/P AKA (above knee amputation), right (HCC)   Chronic pain disorder    aspirin  325 mg Oral Daily   atorvastatin  40 mg Oral Daily   Chlorhexidine Gluconate Cloth  6 each Topical Q0600   cinacalcet  60 mg Oral Q breakfast   diltiazem  120 mg Oral Daily   docusate sodium  100 mg Oral BID   heparin  5,000 Units Subcutaneous Q8H   methocarbamol  500 mg Oral TID   metoprolol tartrate  25 mg Oral BID   oxyCODONE  15 mg Oral Q12H   pantoprazole  40 mg Oral Daily   pregabalin  75 mg Oral QHS   rifampin  300 mg Oral Q8H   senna-docusate  1 tablet Oral BID   sevelamer carbonate  1,600 mg Oral TID WC   sodium chloride flush  3 mL Intravenous Q12H   vancomycin variable dose per unstable  renal function (pharmacist dosing)   Does not apply See admin instructions    SUBJECTIVE: Tearful this morning. Has been told he has no surgery options regarding his prosthetic valve endocarditis. He is aware of the progressive destruction of the mitral valve including instability due to progressive MRSA infection. He is struggling with the idea of stopping HD as he is worried about the pain related to that. He has called his daughters in [place] but does not wish to contact any other family/friends at this time.     Review of Systems: Review of Systems  Constitutional:  Negative for chills and fever.  Respiratory:  Negative for cough.   Cardiovascular:  Negative for chest pain and palpitations.  Skin:  Negative for rash.  Neurological:  Negative for dizziness and headaches.    No Known Allergies  OBJECTIVE: Vitals:   01/24/21 1153 01/24/21 1916 01/25/21 0546 01/25/21 1206  BP: 118/90 114/79 110/83 108/77  Pulse: 92 90 86 83  Resp: '16 18 18 18  '$ Temp: (!) 97.4 F (36.3 C) 98.2 F (36.8 C) 97.9 F (36.6 C)   TempSrc: Oral Oral    SpO2: 98% 100% 96% 98%  Weight:   67.9 kg   Height:       Body mass index is 20.3 kg/m.  Physical Exam Vitals reviewed.  Constitutional:      Comments: Sitting up in bed watching TV. Tearful.   Cardiovascular:     Rate and Rhythm: Normal rate and regular rhythm.     Heart sounds: Murmur heard.  Pulmonary:     Breath sounds: Normal breath sounds.  Skin:    General: Skin is warm and dry.     Capillary Refill: Capillary refill takes less than 2 seconds.  Neurological:     Mental Status: He is oriented to person, place, and time.    Lab Results Lab Results  Component Value Date   WBC 15.4 (H) 01/25/2021   HGB 9.5 (L) 01/25/2021   HCT 32.5 (L) 01/25/2021   MCV 87.6 01/25/2021   PLT 205 01/25/2021    Lab Results  Component Value Date   CREATININE 6.05 (H) 01/25/2021   BUN 32 (H) 01/25/2021   NA 130 (L) 01/25/2021   K 4.0 01/25/2021   CL  89 (L) 01/25/2021   CO2 23 01/25/2021    Lab Results  Component Value Date   ALT 32 01/23/2021   AST 86 (H) 01/23/2021   ALKPHOS 82 01/23/2021   BILITOT 3.9 (H) 01/23/2021     Microbiology: Recent Results (from the past 240 hour(s))  Blood Culture (routine x 2)     Status: None (Preliminary result)   Collection Time: 01/23/21  4:20 AM   Specimen: BLOOD  Result Value Ref Range Status   Specimen Description BLOOD SITE NOT SPECIFIED  Final   Special Requests   Final    BOTTLES DRAWN AEROBIC ONLY Blood Culture results may not be optimal due to an inadequate volume of blood received in culture bottles   Culture  Setup Time   Final    GRAM POSITIVE COCCI IN CLUSTERS AEROBIC BOTTLE ONLY CRITICAL VALUE NOTED.  VALUE IS CONSISTENT WITH PREVIOUSLY REPORTED AND CALLED VALUE. Performed at [facility], [place] [address] [place], [place] [address]    Culture GRAM POSITIVE COCCI  Final   Report Status PENDING  Incomplete  Resp Panel by RT-PCR (Flu A&B, Covid) Nasopharyngeal Swab     Status: None   Collection Time: 01/23/21 11:19 AM   Specimen: Nasopharyngeal Swab; Nasopharyngeal(NP) swabs in vial transport medium  Result Value Ref Range Status   SARS Coronavirus 2 by RT PCR NEGATIVE NEGATIVE Final    Comment: (NOTE) SARS-CoV-2 target nucleic acids are NOT DETECTED.  The SARS-CoV-2 RNA is generally detectable in upper respiratory specimens during the acute phase of infection. The lowest concentration of SARS-CoV-2 viral copies this assay can detect is 138 copies/mL. A negative result does not preclude SARS-Cov-2 infection and should not be used as the sole basis for treatment or other patient management decisions. A negative result may occur with  improper specimen collection/handling, submission of specimen other than nasopharyngeal swab, presence of viral mutation(s) within the areas targeted by this assay, and inadequate number of viral copies(<138 copies/mL). A negative result  must be combined with clinical observations, patient history, and epidemiological information. The expected result is Negative.  Fact Sheet for Patients:  EntrepreneurPulse.com.au  Fact Sheet for Healthcare Providers:  IncredibleEmployment.be  This test is no t yet approved or cleared by the [place] FDA and  has been authorized for detection and/or diagnosis of SARS-CoV-2 by FDA under an Emergency Use Authorization (EUA). This EUA will remain  in effect (meaning this test can be used) for the duration of the COVID-19 declaration under Section 564(b)(1)  of the Act, 21 U.S.C.section 360bbb-3(b)(1), unless the authorization is terminated  or revoked sooner.       Influenza A by PCR NEGATIVE NEGATIVE Final   Influenza B by PCR NEGATIVE NEGATIVE Final    Comment: (NOTE) The Xpert Xpress SARS-CoV-2/FLU/RSV plus assay is intended as an aid in the diagnosis of influenza from Nasopharyngeal swab specimens and should not be used as a sole basis for treatment. Nasal washings and aspirates are unacceptable for Xpert Xpress SARS-CoV-2/FLU/RSV testing.  Fact Sheet for Patients: EntrepreneurPulse.com.au  Fact Sheet for Healthcare Providers: IncredibleEmployment.be  This test is not yet approved or cleared by the [place] FDA and has been authorized for detection and/or diagnosis of SARS-CoV-2 by FDA under an Emergency Use Authorization (EUA). This EUA will remain in effect (meaning this test can be used) for the duration of the COVID-19 declaration under Section 564(b)(1) of the Act, 21 U.S.C. section 360bbb-3(b)(1), unless the authorization is terminated or revoked.  Performed at [facility], [place] [address], [place], [place] [address]     Janene Madeira, MSN, NP-C [facility] Cell: [phone] Pager: [phone]  01/25/2021  12:16 PM

## 2021-01-26 DIAGNOSIS — Z992 Dependence on renal dialysis: Secondary | ICD-10-CM | POA: Diagnosis not present

## 2021-01-26 DIAGNOSIS — A419 Sepsis, unspecified organism: Secondary | ICD-10-CM | POA: Diagnosis not present

## 2021-01-26 DIAGNOSIS — N186 End stage renal disease: Secondary | ICD-10-CM | POA: Diagnosis not present

## 2021-01-26 DIAGNOSIS — I38 Endocarditis, valve unspecified: Secondary | ICD-10-CM | POA: Diagnosis not present

## 2021-01-26 DIAGNOSIS — E8779 Other fluid overload: Secondary | ICD-10-CM | POA: Diagnosis not present

## 2021-01-26 DIAGNOSIS — T826XXD Infection and inflammatory reaction due to cardiac valve prosthesis, subsequent encounter: Secondary | ICD-10-CM | POA: Diagnosis not present

## 2021-01-26 DIAGNOSIS — I330 Acute and subacute infective endocarditis: Secondary | ICD-10-CM | POA: Diagnosis not present

## 2021-01-26 DIAGNOSIS — I132 Hypertensive heart and chronic kidney disease with heart failure and with stage 5 chronic kidney disease, or end stage renal disease: Secondary | ICD-10-CM | POA: Diagnosis not present

## 2021-01-26 DIAGNOSIS — R0600 Dyspnea, unspecified: Secondary | ICD-10-CM | POA: Diagnosis not present

## 2021-01-26 DIAGNOSIS — I5020 Unspecified systolic (congestive) heart failure: Secondary | ICD-10-CM | POA: Diagnosis not present

## 2021-01-26 DIAGNOSIS — D631 Anemia in chronic kidney disease: Secondary | ICD-10-CM | POA: Diagnosis not present

## 2021-01-26 LAB — CBC
HCT: 33 % — ABNORMAL LOW (ref 39.0–52.0)
Hemoglobin: 9.8 g/dL — ABNORMAL LOW (ref 13.0–17.0)
MCH: 25.7 pg — ABNORMAL LOW (ref 26.0–34.0)
MCHC: 29.7 g/dL — ABNORMAL LOW (ref 30.0–36.0)
MCV: 86.6 fL (ref 80.0–100.0)
Platelets: 173 10*3/uL (ref 150–400)
RBC: 3.81 MIL/uL — ABNORMAL LOW (ref 4.22–5.81)
RDW: 25.2 % — ABNORMAL HIGH (ref 11.5–15.5)
WBC: 14.6 10*3/uL — ABNORMAL HIGH (ref 4.0–10.5)
nRBC: 1 % — ABNORMAL HIGH (ref 0.0–0.2)

## 2021-01-26 LAB — BASIC METABOLIC PANEL
Anion gap: 20 — ABNORMAL HIGH (ref 5–15)
BUN: 46 mg/dL — ABNORMAL HIGH (ref 6–20)
CO2: 21 mmol/L — ABNORMAL LOW (ref 22–32)
Calcium: 7.7 mg/dL — ABNORMAL LOW (ref 8.9–10.3)
Chloride: 88 mmol/L — ABNORMAL LOW (ref 98–111)
Creatinine, Ser: 7.36 mg/dL — ABNORMAL HIGH (ref 0.61–1.24)
GFR, Estimated: 8 mL/min — ABNORMAL LOW (ref >60)
Glucose, Bld: 75 mg/dL (ref 70–99)
Potassium: 4.1 mmol/L (ref 3.5–5.1)
Sodium: 129 mmol/L — ABNORMAL LOW (ref 135–145)

## 2021-01-26 MED ORDER — VANCOMYCIN HCL 750 MG/150ML IV SOLN
INTRAVENOUS | Status: AC
  Administered 2021-01-26: 750 mg
  Filled 2021-01-26: qty 150

## 2021-01-26 MED ORDER — VANCOMYCIN HCL IN DEXTROSE 750-5 MG/150ML-% IV SOLN
750.0000 mg | INTRAVENOUS | Status: DC
  Filled 2021-01-26 (×2): qty 150

## 2021-01-26 NOTE — Plan of Care (Signed)
  Problem: Education: Goal: Knowledge of General Education information will improve Description Including pain rating scale, medication(s)/side effects and non-pharmacologic comfort measures Outcome: Progressing   Problem: Health Behavior/Discharge Planning: Goal: Ability to manage health-related needs will improve Outcome: Progressing   

## 2021-01-26 NOTE — NC FL2 (Signed)
[place] LEVEL OF CARE SCREENING TOOL     IDENTIFICATION  Patient Name: Patrick Anthony Birthdate: [birth year] Sex: male Admission Date (Current Location): 01/23/2021  [facility] and [place] Number:  Herbalist and Address:  [facility], [place] [address], [place], [place] [address]      Provider Number: [id number]  Attending Physician Name and Address:  Dessa Phi, DO  Relative Name and Phone Number:  [name], daughter, [phone]    Current Level of Care: Hospital Recommended Level of Care: [place] Prior Approval Number:    Date Approved/Denied:   PASRR Number: [id number]  Discharge Plan: SNF    Current Diagnoses: Patient Active Problem List   Diagnosis Date Noted   Prosthetic valve endocarditis ([place]) 01/23/2021   Chronic pain disorder 01/23/2021   Acute bacterial endocarditis 01/21/2021   SIRS (systemic inflammatory response syndrome) ([place]) 12/22/2020   Moderate protein-calorie malnutrition ([place]) 11/11/2020   Other acute osteomyelitis, right tibia and fibula ([place]) 11/11/2020   S/P AKA (above knee amputation), right (HCC)    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)    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)    Abscess of right leg    S/P mitral valve replacement    S/P mitral valve repair    S/P minimally-invasive mitral valve replacement with bioprosthetic valve 10/14/2020   Dental caries    Chronic apical periodontitis    Chronic periodontitis    Accretions on teeth    Mitral regurgitation    Uncontrolled hypertension    Patent foramen ovale    Cerebral embolism with cerebral infarction 09/29/2020   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 09/29/2020   Bacteremia due to Staphylococcus aureus    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   Lactic acidosis 09/27/2020    Bacterial endocarditis 09/27/2020   Allergy, unspecified, subsequent encounter 04/15/2020   Anaphylactic shock, unspecified, subsequent encounter 0000000   Complication of vascular dialysis catheter 10/31/2019   Kidney disease 02/04/2019   Personal history of Methicillin resistant Staphylococcus aureus infection 09/11/2018   Encounter for immunization 09/07/2017   Dependence on renal dialysis ([place]) 03/07/2017   Iron deficiency anemia, unspecified 11/15/2015   Coagulation defect, unspecified ([place]) 10/08/2015   Gastrointestinal hemorrhage, unspecified 10/08/2015   Pain, unspecified 10/08/2015   Secondary hyperparathyroidism of renal origin ([place]) 10/08/2015   End stage renal disease ([place]) 10/17/2013   Anemia in chronic kidney disease 08/30/2013   Hepatitis C 08/30/2013   ESRD on dialysis ([place]) 08/30/2013   Hypertensive crisis 08/28/2013   Hypertensive emergency 04/20/2013   Chronic combined systolic and diastolic congestive heart failure ([place]) 04/19/2013   HTN (hypertension) 04/19/2013    Orientation RESPIRATION BLADDER Height & Weight     Self, Time, Situation, Place  Normal Continent Weight: 150 lb 5.7 oz (68.2 kg) Height:  6' (182.9 cm)  BEHAVIORAL SYMPTOMS/MOOD NEUROLOGICAL BOWEL NUTRITION STATUS      Continent Diet (see d/c summary)  AMBULATORY STATUS COMMUNICATION OF NEEDS Skin   Extensive Assist Verbally Normal                       Personal Care Assistance Level of Assistance  Bathing, Feeding, Dressing Bathing Assistance: Limited assistance Feeding assistance: Independent Dressing Assistance: Limited assistance     Functional Limitations Info  Sight, Hearing, Speech Sight Info: Impaired Hearing Info: Adequate Speech Info:  Adequate    SPECIAL CARE FACTORS FREQUENCY  PT (By licensed PT), OT (By licensed OT)     PT Frequency: 5x/week OT Frequency: 5x/week            Contractures Contractures Info: Not present    Additional Factors Info  Code  Status, Allergies, Isolation Precautions Code Status Info: Full Allergies Info: NKA     Isolation Precautions Info: MRSA     Current Medications (01/26/2021):  This is the current hospital active medication list Current Facility-Administered Medications  Medication Dose Route Frequency Provider Last Rate Last Admin   acetaminophen (TYLENOL) tablet 650 mg  650 mg Oral Q6H PRN Karmen Bongo, MD       Or   acetaminophen (TYLENOL) suppository 650 mg  650 mg Rectal Q6H PRN Karmen Bongo, MD       aspirin EC tablet 325 mg  325 mg Oral Daily Karmen Bongo, MD   325 mg at 01/26/21 0843   atorvastatin (LIPITOR) tablet 40 mg  40 mg Oral Daily Geralynn Rile, MD   40 mg at 01/26/21 0844   bisacodyl (DULCOLAX) suppository 10 mg  10 mg Rectal Daily PRN Karmen Bongo, MD       calcium carbonate (dosed in mg elemental calcium) suspension 500 mg of elemental calcium  500 mg of elemental calcium Oral Q6H PRN Karmen Bongo, MD       camphor-[address] [facility]) lotion 1 application  1 application Topical [id number] PRN Karmen Bongo, MD       And   hydrOXYzine (ATARAX/VISTARIL) tablet 25 mg  25 mg Oral Q8H PRN Karmen Bongo, MD       Chlorhexidine Gluconate Cloth 2 % PADS 6 each  6 each Topical Q0600 Loren Racer, PA-C   6 each at 01/25/21 1500   cinacalcet (SENSIPAR) tablet 60 mg  60 mg Oral Q breakfast Karmen Bongo, MD   60 mg at 01/26/21 0844   diltiazem (CARDIZEM CD) 24 hr capsule 120 mg  120 mg Oral Daily Karmen Bongo, MD   120 mg at 01/25/21 0816   docusate sodium (COLACE) capsule 100 mg  100 mg Oral BID Karmen Bongo, MD   100 mg at 01/26/21 0844   docusate sodium (ENEMEEZ) enema 283 mg  1 enema Rectal PRN Karmen Bongo, MD       feeding supplement (NEPRO CARB STEADY) liquid 237 mL  237 mL Oral TID PRN Karmen Bongo, MD       heparin injection 5,000 Units  5,000 Units Subcutaneous Camelia Phenes Karmen Bongo, MD   5,000 Units at 01/26/21 0615   methocarbamol (ROBAXIN) tablet 500 mg   500 mg Oral TID Karmen Bongo, MD   500 mg at 01/26/21 0844   metoprolol tartrate (LOPRESSOR) tablet 25 mg  25 mg Oral BID Karmen Bongo, MD   25 mg at 01/25/21 2224   ondansetron (ZOFRAN) tablet 4 mg  4 mg Oral Q6H PRN Karmen Bongo, MD       Or   ondansetron [facility]) injection 4 mg  4 mg Intravenous Q6H PRN Karmen Bongo, MD       oxyCODONE (OXYCONTIN) 12 hr tablet 15 mg  15 mg Oral Q12H Karmen Bongo, MD   15 mg at 01/26/21 0844   pantoprazole (PROTONIX) EC tablet 40 mg  40 mg Oral Daily Karmen Bongo, MD   40 mg at 01/26/21 0844   polyethylene glycol (MIRALAX / GLYCOLAX) packet 17 g  17 g Oral Daily PRN Karmen Bongo, MD  pregabalin (LYRICA) capsule 75 mg  75 mg Oral Ivery Quale, MD   75 mg at 01/25/21 2224   senna-docusate (Senokot-S) tablet 1 tablet  1 tablet Oral BID Karmen Bongo, MD   1 tablet at 01/26/21 0844   sevelamer carbonate (RENVELA) tablet 1,600 mg  1,600 mg Oral TID WC Karmen Bongo, MD   1,600 mg at 01/26/21 0844   sodium chloride flush (NS) 0.9 % injection 3 mL  3 mL Intravenous Lillia [place], MD   3 mL at 01/25/21 2226   sorbitol 70 % solution 30 mL  30 mL Oral PRN Karmen Bongo, MD       vancomycin (VANCOCIN) IVPB 750 mg/150 ml premix  750 mg Intravenous Q M,W,F-HD Dessa Phi, DO       vancomycin variable dose per unstable renal function (pharmacist dosing)   Does not apply See admin instructions Rolla Flatten, RPH       zolpidem (AMBIEN) tablet 5 mg  5 mg Oral QHS PRN Karmen Bongo, MD       Facility-Administered Medications Ordered in Other Encounters  Medication Dose Route Frequency Provider Last Rate Last Admin   regadenoson (LEXISCAN) injection SOLN 0.4 mg  0.4 mg Intravenous Once Cherlynn Kaiser A, MD       technetium tetrofosmin (TC-MYOVIEW) injection [id number] millicurie  [id number] millicurie Intravenous Once PRN Elouise Munroe, MD       technetium tetrofosmin (TC-MYOVIEW) injection [id number] millicurie  [id number] millicurie  Intravenous Once PRN Elouise Munroe, MD         Discharge Medications: Please see discharge summary for a list of discharge medications.  Relevant Imaging Results:  Relevant Lab Results:   Additional Information [id number]  Bethann Berkshire, LCSW

## 2021-01-26 NOTE — Progress Notes (Signed)
PROGRESS NOTE    Patrick Anthony  [id number] DOB: March 14, 1971 DOA: 01/23/2021 PCP: [name], MD     Brief Narrative:  Patrick Anthony is a 50 y.o. 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  He was recommended to have 6 weeks of Vanc/Rifampin treatment (through 7/8) and completed 2 weeks of gentamicin treatment.  CT surgery determined that he is not a candidate for redo valve replacement.  He reports since d/c he has noticed what "feels like heartburn" periodically - particularly at HD sessions.  He noticed it today and it lasted a long time so he decided to come in for evaluation.  He is noticing some SOB, including PND and periodic orthopnea.  No fever or cough.  Patient was admitted for chest pain, cardiology, infectious disease, nephrology also consulted.  Echocardiogram revealed rocking, dehiscence, stenosis and fracture/perforation the mitral prosthesis.  Cardiothoracic surgery was consulted again, unfortunately patient is not a candidate for redo valve replacement.  Multiple providers have recommended palliative care/hospice.  New events last 24 hours / Subjective: Patient with shortness of breath after getting up to use the bathroom.  No new complaints.  Per conversation with palliative care medicine, patient is not yet ready to give up dialysis yet.  Plan to discharge back to SNF, with dialysis and oral  antibiotics to give him some time with family.  Palliative care medicine should continue to follow at SNF.  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  He is status post mitral valve replacement, right AKA. -Infectious disease following. Continue vancomycin, rifampin for now until goals of care are established -Echocardiogram revealed rocking, dehiscence, stenosis and fracture/perforation the mitral prosthesis.  -Per CT surgery, patient not a candidate for redo surgery -Palliative care following for goals of care conversation  Demand ischemia Chest pain -Echocardiogram as below  -Cardiology consulted, suspected to be demand ischemia in setting of underlying infectious process, metabolic derangement -Continue aspirin, statin -Cardiology signed off 6/21 no further cardiac work-up planned  Atrial tachycardia  -Continue cardizem, lopressor   ESRD -HD MWF -Nephrology following  Cirrhosis with hepatitis C -Will need outpatient follow-up, depending on goals of care conversation    DVT prophylaxis:  heparin injection 5,000 Units Start: 01/23/21 1400  Code Status:     Code Status Orders  (From admission, onward)           Start     Ordered   01/23/21 1333  Full code  Continuous        01/23/21 1336           Code Status History  Date Active Date Inactive Code Status Order ID Comments User Context   12/22/2020 1833 01/07/2021 0343 Full Code [id number]  Norval Morton, MD ED   09/28/2020 1042 11/11/2020 0303 Full Code [id number]  Oswald Hillock, MD Inpatient   09/27/2020  1808 09/28/2020 1042 Full Code [id number]  Allie Dimmer, MD ED   10/30/2019 1259 10/31/2019 1704 Full Code [id number]  Marval Regal Inpatient   04/20/2013 0234 04/24/2013 1650 Full Code [id number]  Ivor Costa, MD Inpatient      Family Communication: None at bedside  Disposition Plan:  Status is: Inpatient  Remains inpatient appropriate because:Inpatient level of care appropriate due to severity of illness  Dispo: The patient is from: [place]              Anticipated d/c is to: [facility]              Patient currently is medically stable to d/c.  Complete dialysis today.  Awaiting final antibiotic recommendations.  Likely discharge with palliative care to follow as an outpatient.   Difficult to place patient No      Consultants:  Infectious disease Cardiology Nephrology Cardiothoracic surgery Palliative care medicine  Antimicrobials:  Anti-infectives (From admission, onward)    Start     Dose/Rate Route Frequency Ordered Stop   01/24/21 1318  vancomycin variable dose per unstable renal function (pharmacist dosing)         Does not apply See admin instructions 01/24/21 1318     01/24/21 1200  vancomycin (VANCOREADY) IVPB 750 mg/150 mL  Status:  Discontinued        750 mg 150 mL/hr over 60 Minutes Intravenous Every M-W-F (Hemodialysis) 01/23/21 1134 01/24/21 0733   01/24/21 0830  vancomycin (VANCOREADY) IVPB 750 mg/150 mL        750 mg 150 mL/hr over 60 Minutes Intravenous STAT 01/24/21 0734 01/24/21 0950   01/23/21 2115  vancomycin (VANCOREADY) IVPB 750 mg/150 mL  Status:  Discontinued        750 mg 150 mL/hr over 60 Minutes Intravenous  Once 01/23/21 2103 01/24/21 0733   01/23/21 1500  rifampin (RIFADIN) capsule 300 mg  Status:  Discontinued       Note to Pharmacy: 6 weeks from 5/27     300 mg Oral Every 8 hours 01/23/21 1336 01/26/21 0929   01/23/21 1130  ceFEPIme (MAXIPIME) 2 g in sodium chloride 0.9 % 100 mL IVPB        2 g 200 mL/hr over 30 Minutes  Intravenous  Once 01/23/21 1122 01/23/21 1305        Objective: Vitals:   01/25/21 2050 01/25/21 2224 01/26/21 0541 01/26/21 1205  BP: 103/71 109/84 103/83 109/78  Pulse: 84 83  81  Resp: '20  18 18  '$ Temp: 98 F (36.7 C)  (!) 97.4 F (36.3 C) (!) 97.3 F (36.3 C)  TempSrc:      SpO2: 90%  95% 95%  Weight:   68.2 kg   Height:        Intake/Output Summary (Last 24 hours) at 01/26/2021 1219 Last data filed at 01/25/2021 2255 Gross per 24 hour  Intake 480 ml  Output 0 ml  Net 480 ml    Filed Weights   01/24/21 0143 01/25/21 0546 01/26/21 0541  Weight: 65.5 kg 67.9 kg 68.2 kg   Examination: General exam: Appears calm   Respiratory system: Diminished breath sounds, slightly labored in setting of recent exertion Cardiovascular system: S1 & S2  heard, RRR. + Murmur.  No pedal edema. Gastrointestinal system: Abdomen is nondistended, soft and nontender. Normal bowel sounds heard. Central nervous system: Alert and oriented. Non focal exam. Speech clear  Extremities: Right AKA Psychiatry: Judgement and insight appear stable. Mood & affect appropriate.    Data Reviewed: I have personally reviewed following labs and imaging studies  CBC: Recent Labs  Lab 01/23/21 0920 01/24/21 0416 01/25/21 0314 01/26/21 0245  WBC 17.5* 19.7* 15.4* 14.6*  NEUTROABS 14.6*  --   --   --   HGB 9.4* 9.6* 9.5* 9.8*  HCT 33.8* 33.4* 32.5* 33.0*  MCV 91.6 87.7 87.6 86.6  PLT 259 229 205 [id number]    Basic Metabolic Panel: Recent Labs  Lab 01/23/21 0920 01/24/21 0416 01/25/21 0314 01/26/21 0245  NA 133* 133* 130* 129*  K 4.1 3.9 4.0 4.1  CL 91* 93* 89* 88*  CO2 '22 24 23 '$ 21*  GLUCOSE 85 102* 89 75  BUN 25* 15 32* 46*  CREATININE 6.32* 4.40* 6.05* 7.36*  CALCIUM 8.1* 7.8* 7.8* 7.7*    GFR: Estimated Creatinine Clearance: 11.7 mL/min (A) (by C-G formula based on SCr of 7.36 mg/dL (H)). Liver Function Tests: Recent Labs  Lab 01/23/21 0920  AST 86*  ALT 32  ALKPHOS 82  BILITOT 3.9*   PROT 8.6*  ALBUMIN 2.3*    No results for input(s): LIPASE, AMYLASE in the last 168 hours. No results for input(s): AMMONIA in the last 168 hours. Coagulation Profile: Recent Labs  Lab 01/23/21 0920  INR 1.6*    Cardiac Enzymes: No results for input(s): CKTOTAL, CKMB, CKMBINDEX, TROPONINI in the last 168 hours. BNP (last 3 results) No results for input(s): PROBNP in the last 8760 hours. HbA1C: No results for input(s): HGBA1C in the last 72 hours. CBG: No results for input(s): GLUCAP in the last 168 hours. Lipid Profile: No results for input(s): CHOL, HDL, LDLCALC, TRIG, CHOLHDL, LDLDIRECT in the last 72 hours. Thyroid Function Tests: No results for input(s): TSH, T4TOTAL, FREET4, T3FREE, THYROIDAB in the last 72 hours. Anemia Panel: No results for input(s): VITAMINB12, FOLATE, FERRITIN, TIBC, IRON, RETICCTPCT in the last 72 hours. Sepsis Labs: Recent Labs  Lab 01/23/21 0901 01/23/21 1119 01/24/21 0416  PROCALCITON  --   --  1.50  LATICACIDVEN 4.8* 4.4* 4.2*     Recent Results (from the past 240 hour(s))  Blood Culture (routine x 2)     Status: None (Preliminary result)   Collection Time: 01/23/21  4:20 AM   Specimen: BLOOD  Result Value Ref Range Status   Specimen Description BLOOD SITE NOT SPECIFIED  Final   Special Requests   Final    BOTTLES DRAWN AEROBIC ONLY Blood Culture results may not be optimal due to an inadequate volume of blood received in culture bottles   Culture  Setup Time   Final    GRAM POSITIVE COCCI IN CLUSTERS AEROBIC BOTTLE ONLY CRITICAL VALUE NOTED.  VALUE IS CONSISTENT WITH PREVIOUSLY REPORTED AND CALLED VALUE.    Culture   Final    GRAM POSITIVE COCCI IDENTIFICATION TO FOLLOW Performed at [facility], [place] [address], [place], [place] [address]    Report Status PENDING  Incomplete  Resp Panel by RT-PCR (Flu A&B, Covid) Nasopharyngeal Swab     Status: None   Collection Time: 01/23/21 11:19 AM   Specimen: Nasopharyngeal Swab;  Nasopharyngeal(NP) swabs in vial transport medium  Result Value Ref Range Status   SARS Coronavirus 2 by RT PCR NEGATIVE  NEGATIVE Final    Comment: (NOTE) SARS-CoV-2 target nucleic acids are NOT DETECTED.  The SARS-CoV-2 RNA is generally detectable in upper respiratory specimens during the acute phase of infection. The lowest concentration of SARS-CoV-2 viral copies this assay can detect is 138 copies/mL. A negative result does not preclude SARS-Cov-2 infection and should not be used as the sole basis for treatment or other patient management decisions. A negative result may occur with  improper specimen collection/handling, submission of specimen other than nasopharyngeal swab, presence of viral mutation(s) within the areas targeted by this assay, and inadequate number of viral copies(<138 copies/mL). A negative result must be combined with clinical observations, patient history, and epidemiological information. The expected result is Negative.  Fact Sheet for Patients:  EntrepreneurPulse.com.au  Fact Sheet for Healthcare Providers:  IncredibleEmployment.be  This test is no t yet approved or cleared by the [place] FDA and  has been authorized for detection and/or diagnosis of SARS-CoV-2 by FDA under an Emergency Use Authorization (EUA). This EUA will remain  in effect (meaning this test can be used) for the duration of the COVID-19 declaration under Section 564(b)(1) of the Act, 21 U.S.C.section 360bbb-3(b)(1), unless the authorization is terminated  or revoked sooner.       Influenza A by PCR NEGATIVE NEGATIVE Final   Influenza B by PCR NEGATIVE NEGATIVE Final    Comment: (NOTE) The Xpert Xpress SARS-CoV-2/FLU/RSV plus assay is intended as an aid in the diagnosis of influenza from Nasopharyngeal swab specimens and should not be used as a sole basis for treatment. Nasal washings and aspirates are unacceptable for Xpert Xpress  SARS-CoV-2/FLU/RSV testing.  Fact Sheet for Patients: EntrepreneurPulse.com.au  Fact Sheet for Healthcare Providers: IncredibleEmployment.be  This test is not yet approved or cleared by the [place] FDA and has been authorized for detection and/or diagnosis of SARS-CoV-2 by FDA under an Emergency Use Authorization (EUA). This EUA will remain in effect (meaning this test can be used) for the duration of the COVID-19 declaration under Section 564(b)(1) of the Act, 21 U.S.C. section 360bbb-3(b)(1), unless the authorization is terminated or revoked.  Performed at [facility], [place] [address], [place], [place] [address]        Radiology Studies: No results found.    Scheduled Meds:  aspirin  325 mg Oral Daily   atorvastatin  40 mg Oral Daily   Chlorhexidine Gluconate Cloth  6 each Topical Q0600   cinacalcet  60 mg Oral Q breakfast   diltiazem  120 mg Oral Daily   docusate sodium  100 mg Oral BID   heparin  5,000 Units Subcutaneous Q8H   methocarbamol  500 mg Oral TID   metoprolol tartrate  25 mg Oral BID   oxyCODONE  15 mg Oral Q12H   pantoprazole  40 mg Oral Daily   pregabalin  75 mg Oral QHS   senna-docusate  1 tablet Oral BID   sevelamer carbonate  1,600 mg Oral TID WC   sodium chloride flush  3 mL Intravenous Q12H   vancomycin variable dose per unstable renal function (pharmacist dosing)   Does not apply See admin instructions   Continuous Infusions:   LOS: 3 days      Time spent: 25 minutes   Dessa Phi, DO [facility] 01/26/2021, 12:19 PM   Available via Epic secure chat 7am-7pm After these hours, please refer to coverage provider listed on [email or web address]

## 2021-01-26 NOTE — Progress Notes (Signed)
[facility] for Infectious Disease  Date of Admission:  01/23/2021      Total days of antibiotics   Vancomycin post HD + TID Rifampin PO           ASSESSMENT: Patrick Anthony is a 50 y.o. male with progressive prosthetic valve endocarditis due to MRSA despite optimal medical therapy. No surgical options for him given likelihood he will not survive this with either medical /surgical intervention.   Will stop rifampin PO to help with liberalization of appetite and reducing nausea. Would continue IV vancomycin after HD for now until he feels worse to help with his goal of "getting as much time as possible" for palliative efforts. We could do doxy po however this is highly likely to cause nausea as well. Would like to avoid that if possible.   Offered appointment back with ID in 3-4 weeks to check in and reconsider plan for palliative abx - he accepted.     PLAN: Continue IV vancomycin  Stop PO rifampin  Will see him again in 3-4 weeks virtually to reassess goals and palliative approach to abx  Will sign off - happy to see him back if he has any other ID pertinent questions.    Principal Problem:   Prosthetic valve endocarditis (HCC) Active Problems:   ESRD on dialysis ([place])   Lactic acidosis   S/P mitral valve replacement   S/P AKA (above knee amputation), right (HCC)   Chronic pain disorder    aspirin  325 mg Oral Daily   atorvastatin  40 mg Oral Daily   Chlorhexidine Gluconate Cloth  6 each Topical Q0600   cinacalcet  60 mg Oral Q breakfast   diltiazem  120 mg Oral Daily   docusate sodium  100 mg Oral BID   heparin  5,000 Units Subcutaneous Q8H   methocarbamol  500 mg Oral TID   metoprolol tartrate  25 mg Oral BID   oxyCODONE  15 mg Oral Q12H   pantoprazole  40 mg Oral Daily   pregabalin  75 mg Oral QHS   senna-docusate  1 tablet Oral BID   sevelamer carbonate  1,600 mg Oral TID WC   sodium chloride flush  3 mL Intravenous Q12H   vancomycin variable  dose per unstable renal function (pharmacist dosing)   Does not apply See admin instructions    SUBJECTIVE: Quiet today. Working with PT. No new concerns/questions to add today.     Review of Systems: Review of Systems  Constitutional:  Negative for chills and fever.  Respiratory:  Negative for cough.   Cardiovascular:  Negative for chest pain and palpitations.  Skin:  Negative for rash.  Neurological:  Negative for dizziness and headaches.    No Known Allergies  OBJECTIVE: Vitals:   01/25/21 2050 01/25/21 2224 01/26/21 0541 01/26/21 1205  BP: 103/71 109/84 103/83 109/78  Pulse: 84 83  81  Resp: '20  18 18  '$ Temp: 98 F (36.7 C)  (!) 97.4 F (36.3 C) (!) 97.3 F (36.3 C)  TempSrc:      SpO2: 90%  95% 95%  Weight:   68.2 kg   Height:       Body mass index is 20.39 kg/m.  Physical Exam Vitals reviewed.  Constitutional:      Comments: Sitting up in recliner working with PT.   Cardiovascular:     Rate and Rhythm: Normal rate and regular rhythm.     Heart sounds:  Murmur heard.  Pulmonary:     Breath sounds: Normal breath sounds.  Skin:    General: Skin is warm and dry.     Capillary Refill: Capillary refill takes less than 2 seconds.  Neurological:     Mental Status: He is oriented to person, place, and time.    Lab Results Lab Results  Component Value Date   WBC 14.6 (H) 01/26/2021   HGB 9.8 (L) 01/26/2021   HCT 33.0 (L) 01/26/2021   MCV 86.6 01/26/2021   PLT 173 01/26/2021    Lab Results  Component Value Date   CREATININE 7.36 (H) 01/26/2021   BUN 46 (H) 01/26/2021   NA 129 (L) 01/26/2021   K 4.1 01/26/2021   CL 88 (L) 01/26/2021   CO2 21 (L) 01/26/2021    Lab Results  Component Value Date   ALT 32 01/23/2021   AST 86 (H) 01/23/2021   ALKPHOS 82 01/23/2021   BILITOT 3.9 (H) 01/23/2021     Microbiology: Recent Results (from the past 240 hour(s))  Blood Culture (routine x 2)     Status: Abnormal (Preliminary result)   Collection Time:  01/23/21  4:20 AM   Specimen: BLOOD  Result Value Ref Range Status   Specimen Description BLOOD SITE NOT SPECIFIED  Final   Special Requests   Final    BOTTLES DRAWN AEROBIC ONLY Blood Culture results may not be optimal due to an inadequate volume of blood received in culture bottles   Culture  Setup Time   Final    GRAM POSITIVE COCCI IN CLUSTERS AEROBIC BOTTLE ONLY CRITICAL VALUE NOTED.  VALUE IS CONSISTENT WITH PREVIOUSLY REPORTED AND CALLED VALUE.    Culture (A)  Final    STAPHYLOCOCCUS EPIDERMIDIS THE SIGNIFICANCE OF ISOLATING THIS ORGANISM FROM A SINGLE SET OF BLOOD CULTURES WHEN MULTIPLE SETS ARE DRAWN IS UNCERTAIN. PLEASE NOTIFY THE MICROBIOLOGY DEPARTMENT WITHIN ONE WEEK IF SPECIATION AND SENSITIVITIES ARE REQUIRED. Performed at [facility], [place] [address] [place], [place] [address]    Report Status PENDING  Incomplete  Resp Panel by RT-PCR (Flu A&B, Covid) Nasopharyngeal Swab     Status: None   Collection Time: 01/23/21 11:19 AM   Specimen: Nasopharyngeal Swab; Nasopharyngeal(NP) swabs in vial transport medium  Result Value Ref Range Status   SARS Coronavirus 2 by RT PCR NEGATIVE NEGATIVE Final    Comment: (NOTE) SARS-CoV-2 target nucleic acids are NOT DETECTED.  The SARS-CoV-2 RNA is generally detectable in upper respiratory specimens during the acute phase of infection. The lowest concentration of SARS-CoV-2 viral copies this assay can detect is 138 copies/mL. A negative result does not preclude SARS-Cov-2 infection and should not be used as the sole basis for treatment or other patient management decisions. A negative result may occur with  improper specimen collection/handling, submission of specimen other than nasopharyngeal swab, presence of viral mutation(s) within the areas targeted by this assay, and inadequate number of viral copies(<138 copies/mL). A negative result must be combined with clinical observations, patient history, and  epidemiological information. The expected result is Negative.  Fact Sheet for Patients:  EntrepreneurPulse.com.au  Fact Sheet for Healthcare Providers:  IncredibleEmployment.be  This test is no t yet approved or cleared by the [place] FDA and  has been authorized for detection and/or diagnosis of SARS-CoV-2 by FDA under an Emergency Use Authorization (EUA). This EUA will remain  in effect (meaning this test can be used) for the duration of the COVID-19 declaration under Section 564(b)(1) of  the Act, 21 U.S.C.section 360bbb-3(b)(1), unless the authorization is terminated  or revoked sooner.       Influenza A by PCR NEGATIVE NEGATIVE Final   Influenza B by PCR NEGATIVE NEGATIVE Final    Comment: (NOTE) The Xpert Xpress SARS-CoV-2/FLU/RSV plus assay is intended as an aid in the diagnosis of influenza from Nasopharyngeal swab specimens and should not be used as a sole basis for treatment. Nasal washings and aspirates are unacceptable for Xpert Xpress SARS-CoV-2/FLU/RSV testing.  Fact Sheet for Patients: EntrepreneurPulse.com.au  Fact Sheet for Healthcare Providers: IncredibleEmployment.be  This test is not yet approved or cleared by the [place] FDA and has been authorized for detection and/or diagnosis of SARS-CoV-2 by FDA under an Emergency Use Authorization (EUA). This EUA will remain in effect (meaning this test can be used) for the duration of the COVID-19 declaration under Section 564(b)(1) of the Act, 21 U.S.C. section 360bbb-3(b)(1), unless the authorization is terminated or revoked.  Performed at [facility], [place] [address] [place],  [address]     Janene Madeira, MSN, NP-C [facility] [place] Cell: [phone] Pager: [phone]  01/26/2021  12:42 PM

## 2021-01-26 NOTE — TOC Initial Note (Addendum)
Transition of Care [facility]) - Initial/Assessment Note    Patient Details  Name: Patrick Anthony MRN: [id number] Date of Birth: 1970-12-16  Transition of Care  [facility]) CM/SW Contact:    Bethann Berkshire, [place] Phone Number: 01/26/2021, 1:07 PM  Clinical Narrative:                  CSW called [facility] Liaison who confirmed pt can return tomorrow. She reports no new covid test or auth needed. FL2 completed and sent in hub to [facility].       Patient Goals and CMS Choice        Expected Discharge Plan and Services                                                Prior Living Arrangements/Services                       Activities of Daily Living Home Assistive Devices/Equipment: Gilford Rile (specify type) ADL Screening (condition at time of admission) Patient's cognitive ability adequate to safely complete daily activities?: Yes Is the patient deaf or have difficulty hearing?: No Does the patient have difficulty seeing, even when wearing glasses/contacts?: Yes Does the patient have difficulty concentrating, remembering, or making decisions?: No Patient able to express need for assistance with ADLs?: Yes Does the patient have difficulty dressing or bathing?: Yes Independently performs ADLs?: No Communication: Needs assistance Dressing (OT): Independent Grooming: Independent Feeding: Independent Bathing: Needs assistance Is this a change from baseline?: Pre-admission baseline Toileting: Needs assistance Is this a change from baseline?: Pre-admission baseline In/Out Bed: Needs assistance Is this a change from baseline?: Pre-admission baseline Walks in Home: Needs assistance Is this a change from baseline?: Pre-admission baseline Does the patient have difficulty walking or climbing stairs?: No Weakness of Legs: Right (AKA) Weakness of Arms/Hands: None  Permission Sought/Granted                  Emotional Assessment              Admission diagnosis:   NSTEMI (non-ST elevated myocardial infarction) ([place]) [I21.4] SIRS (systemic inflammatory response syndrome) ([place]) [R65.10] Sepsis due to undetermined organism [facility]) [A41.9] Patient Active Problem List   Diagnosis Date Noted   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   Chronic pain disorder 01/23/2021   Acute bacterial endocarditis 01/21/2021   SIRS (systemic inflammatory response syndrome) ([place]) 12/22/2020   Moderate protein-calorie malnutrition ([place]) 11/11/2020   Other acute osteomyelitis, right tibia and fibula ([place]) 11/11/2020   S/P AKA (above knee amputation), right (HCC)    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)    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)    Abscess of right leg    S/P mitral valve replacement    S/P mitral valve repair    S/P minimally-invasive mitral valve replacement with bioprosthetic valve 10/14/2020   Dental caries    Chronic apical periodontitis    Chronic periodontitis    Accretions on teeth    Mitral regurgitation    Uncontrolled hypertension    Patent foramen ovale    Cerebral embolism with cerebral infarction 09/29/2020   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 09/29/2020   Bacteremia due to Staphylococcus aureus    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   Lactic acidosis 09/27/2020   Bacterial endocarditis 09/27/2020   Allergy, unspecified, subsequent encounter 04/15/2020   Anaphylactic shock, unspecified, subsequent encounter 0000000   Complication of vascular dialysis catheter 10/31/2019   Kidney disease 02/04/2019   Personal history of Methicillin resistant Staphylococcus aureus infection 09/11/2018   Encounter for immunization 09/07/2017   Dependence on renal dialysis ([place]) 03/07/2017   Iron deficiency anemia, unspecified 11/15/2015   Coagulation defect, unspecified ([place]) 10/08/2015   Gastrointestinal  hemorrhage, unspecified 10/08/2015   Pain, unspecified 10/08/2015   Secondary hyperparathyroidism of renal origin ([place]) 10/08/2015   End stage renal disease ([place]) 10/17/2013   Anemia in chronic kidney disease 08/30/2013   Hepatitis C 08/30/2013   ESRD on dialysis ([place]) 08/30/2013   Hypertensive crisis 08/28/2013   Hypertensive emergency 04/20/2013   Chronic combined systolic and diastolic congestive heart failure ([place]) 04/19/2013   HTN (hypertension) 04/19/2013   PCP:  [name], MD Pharmacy:  No Pharmacies Listed    Social Determinants of Health (SDOH) Interventions    Readmission Risk Interventions Readmission Risk Prevention Plan 11/05/2020  Transportation Screening Complete  Medication Review ([place]) Complete  PCP or Specialist appointment within 3-5 days of discharge Complete  HRI or Home Care Consult Complete  SW Recovery Care/Counseling Consult Complete  Palliative Care Screening Complete  Skilled Nursing Facility Complete  Some recent data might be hidden

## 2021-01-26 NOTE — Evaluation (Signed)
Occupational Therapy Evaluation Patient Details Name: Patrick Anthony MRN: [id number] DOB: August 19, 1970 Today's Date: 01/26/2021    History of Present Illness Pt adm 6/19 with severe sepsis due to recurrent MRSA bacteremia, endocarditis. Pt not a candidate for valve redo and recommended palliative/hospice. PMH - ESRD on HD, rt AKA, MVR, chf, HTN, cirrhosis, IVDA, septic emboli   Clinical Impression   PTA patient reports using WC for mobility, ADLs with modified independence. Admitted for above and limited by problem list below, including generalized weakness, decreased activity tolerance and impaired balance. Currently requires min guard for transfers using RW, up to min assist for ADLs.  Believe he will benefit from further OT services while admitted and after dc at [facility] level (returning to ALF) to optimize strength, endurance and safety with ADL participation.     Follow Up Recommendations  Home health OT;Supervision - Intermittent    Equipment Recommendations  None recommended by OT    Recommendations for Other Services       Precautions / Restrictions Precautions Precautions: Fall Restrictions Weight Bearing Restrictions: No RLE Weight Bearing: Non weight bearing      Mobility Bed Mobility               General bed mobility comments: OOB in recliner upon entry    Transfers Overall transfer level: Needs assistance Equipment used: Rolling walker (2 wheeled) Transfers: Sit to/from [facility] Sit to Stand: Min guard Stand pivot transfers: Min guard       General transfer comment: for safety/balance    Balance Overall balance assessment: Needs assistance Sitting-balance support: No upper extremity supported;Feet supported Sitting balance-Leahy Scale: Good     Standing balance support: Bilateral upper extremity supported;Single extremity supported;During functional activity Standing balance-Leahy Scale: Fair Standing balance comment: relies on  at least 1 UE support in standing, dynamically requires B UE                           ADL either performed or assessed with clinical judgement   ADL Overall ADL's : Needs assistance/impaired     Grooming: Set up;Sitting   Upper Body Bathing: Set up;Sitting   Lower Body Bathing: Min guard;Sitting/lateral leans;Sit to/from stand   Upper Body Dressing : Set up;Sitting   Lower Body Dressing: Min guard;Sitting/lateral leans;Sit to/from stand   Toilet Transfer: Min guard;Stand-pivot;RW           Functional mobility during ADLs: Min guard;Rolling walker General ADL Comments: pt limited by decreased activity tolerance and generalized weakness     Vision   Vision Assessment?: No apparent visual deficits     Perception     Praxis      Pertinent Vitals/Pain Pain Assessment: 0-10 Pain Score: 6  Pain Location: back Pain Descriptors / Indicators:  (straining) Pain Intervention(s): Limited activity within patient's tolerance;Monitored during session;Repositioned     Hand Dominance Left   Extremity/Trunk Assessment Upper Extremity Assessment Upper Extremity Assessment: Generalized weakness   Lower Extremity Assessment Lower Extremity Assessment: Defer to PT evaluation       Communication Communication Communication: No difficulties   Cognition Arousal/Alertness: Awake/alert Behavior During Therapy: WFL for tasks assessed/performed Overall Cognitive Status: Within Functional Limits for tasks assessed                                     General Comments  VSS    Exercises  Shoulder Instructions      Home Living Family/patient expects to be discharged to:: Assisted living                             Home Equipment: Gilford Rile - 2 wheels;Wheelchair - manual   Additional Comments: reports from [employer]      Prior Functioning/Environment Level of Independence: Needs assistance  Gait / Transfers Assistance Needed:  using WC for most mobility, transfers independently ADL's / Homemaking Assistance Needed: independent ADLs from [facility] level, washes up at the sink            OT Problem List: Decreased strength;Decreased activity tolerance;Impaired balance (sitting and/or standing);Decreased knowledge of use of DME or AE;Decreased knowledge of precautions      OT Treatment/Interventions: Self-care/ADL training;Therapeutic exercise;DME and/or AE instruction;Therapeutic activities;Patient/family education;Balance training;Energy conservation    OT Goals(Current goals can be found in the care plan section) Acute Rehab OT Goals Patient Stated Goal: get stronger OT Goal Formulation: With patient Time For Goal Achievement: 02/09/21 Potential to Achieve Goals: Good  OT Frequency: Min 2X/week   Barriers to D/C:            Co-evaluation              AM-PAC OT "6 Clicks" Daily Activity     Outcome Measure Help from another person eating meals?: None Help from another person taking care of personal grooming?: A Little Help from another person toileting, which includes using toliet, bedpan, or urinal?: A Little Help from another person bathing (including washing, rinsing, drying)?: A Little Help from another person to put on and taking off regular upper body clothing?: None Help from another person to put on and taking off regular lower body clothing?: A Little 6 Click Score: 20   End of Session Equipment Utilized During Treatment: Rolling walker Nurse Communication: Mobility status  Activity Tolerance: Patient tolerated treatment well Patient left: in chair;with call bell/phone within reach;with chair alarm set  OT Visit Diagnosis: Other abnormalities of gait and mobility (R26.89);Muscle weakness (generalized) (M62.81)                Time: [id number] OT Time Calculation (min): 17 min Charges:  OT General Charges $OT Visit: 1 Visit OT Evaluation $OT Eval Moderate Complexity: 1 Mod  Jolaine Artist, OT Acute Rehabilitation Services Pager [phone] Office [phone]   Patrick Anthony 01/26/2021, 12:11 PM

## 2021-01-26 NOTE — Progress Notes (Signed)
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, and native AV IE, recent BKA for OM, CVAs from septic emboli admitted with sepsis and chest pain  Subjective:  Seen by palliative, workign to define [place], he wants to cont on HD Working with PT and OT  06/21 0701 - 06/22 0700 In: 33 [P.O.:720; I.V.:3] Out: 0   Filed Weights   01/24/21 0143 01/25/21 0546 01/26/21 0541  Weight: 65.5 kg 67.9 kg 68.2 kg    Scheduled Meds:  aspirin  325 mg Oral Daily   atorvastatin  40 mg Oral Daily   Chlorhexidine Gluconate Cloth  6 each Topical Q0600   cinacalcet  60 mg Oral Q breakfast   diltiazem  120 mg Oral Daily   docusate sodium  100 mg Oral BID   heparin  5,000 Units Subcutaneous Q8H   methocarbamol  500 mg Oral TID   metoprolol tartrate  25 mg Oral BID   oxyCODONE  15 mg Oral Q12H   pantoprazole  40 mg Oral Daily   pregabalin  75 mg Oral QHS   senna-docusate  1 tablet Oral BID   sevelamer carbonate  1,600 mg Oral TID WC   sodium chloride flush  3 mL Intravenous Q12H   vancomycin variable dose per unstable renal function (pharmacist dosing)   Does not apply See admin instructions   Continuous Infusions: PRN Meds:.acetaminophen **OR** acetaminophen, bisacodyl, calcium carbonate (dosed in mg elemental calcium), camphor-menthol **AND** hydrOXYzine, docusate sodium, feeding supplement (NEPRO CARB STEADY), ondansetron **OR** ondansetron (ZOFRAN) IV, polyethylene glycol, sorbitol, zolpidem  Current Labs: reviewed    Physical Exam:  Blood pressure 103/83, pulse 83, temperature (!) 97.4 F (36.3 C), resp. rate 18, height 6' (1.829 m), weight 68.2 kg, SpO2 95 %. NAD,c hronically ill appearing NCAT RRR, 3/6 MSM CTAB LUE AVG +B, bandaged S/ntn/d R BKA stump LLE no edema  Dialysis Orders:  MWF at [facility] 4hr, 350/500, EDW 67kg, 2K/2Ca, UFP#1, AVG, heparin 5000 - Mircera 229mg Iv q 2 weeks (last '150mg'$  on 6/6) - No VDRA - Has  been getting Vanc '750mg'$  x HD, does not appear to have missed any. Vanc trough 17.3 on 01/17/21.   Assessment/Plan: Progressive MV and AV MRSA IE while on ABX, not a surgical candidate, hospice recommended. RCID rec Doxy at DC ESRD:  Pt struggling with stopping HD, palliative following. For now cont on HD until more symptomatic.  HD today on schedule. Cont on MWF for now.    Hypertension/volume: improved from admit  Anemia: Hgb 9s  rece ESA [id number] Metabolic bone disease: Ca ok, cont binders  Nutrition:  protein supplements.  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 Morphine should not be used.  Baclofen should be avoided Avoid oral sodium phosphate and magnesium citrate based laxatives / bowel preps    RPearson GrippeMD 01/26/2021, 11:31 AM  Recent Labs  Lab 01/24/21 0416 01/25/21 0314 01/26/21 0245  NA 133* 130* 129*  K 3.9 4.0 4.1  CL 93* 89* 88*  CO2 24 23 21*  GLUCOSE 102* 89 75  BUN 15 32* 46*  CREATININE 4.40* 6.05* 7.36*  CALCIUM 7.8* 7.8* 7.7*    Recent Labs  Lab 01/23/21 0920 01/24/21 0416 01/25/21 0314 01/26/21 0245  WBC 17.5* 19.7* 15.4* 14.6*  NEUTROABS 14.6*  --   --   --  HGB 9.4* 9.6* 9.5* 9.8*  HCT 33.8* 33.4* 32.5* 33.0*  MCV 91.6 87.7 87.6 86.6  PLT 259 229 205 173

## 2021-01-26 NOTE — Evaluation (Signed)
Physical Therapy Evaluation Patient Details Name: Patrick Anthony MRN: [id number] DOB: 1971-02-27 Today's Date: 01/26/2021   History of Present Illness  Pt adm 6/19 with severe sepsis due to recurrent MRSA bacteremia, endocarditis. Pt not a candidate for valve redo and recommended palliative/hospice. PMH - ESRD on HD, rt AKA, MVR, chf, HTN, cirrhosis, IVDA, septic emboli  Clinical Impression  Pt admitted with above diagnosis and presents to PT with functional limitations due to deficits listed below (See PT problem list). Pt needs skilled PT to maximize independence and safety to allow discharge back to facility at prior level of care.      Follow Up Recommendations Other (comment) (return to prior level of care at nursing facility)    Equipment Recommendations  None recommended by PT    Recommendations for Other Services       Precautions / Restrictions Precautions Precautions: Fall Restrictions Weight Bearing Restrictions: No RLE Weight Bearing: Non weight bearing      Mobility  Bed Mobility Overal bed mobility: Modified Independent             General bed mobility comments: OOB in recliner upon entry    Transfers Overall transfer level: Needs assistance Equipment used: Rolling walker (2 wheeled) Transfers: Sit to/from Stand Sit to Stand: Min guard Stand pivot transfers: Min guard       General transfer comment: for safety/balance  Ambulation/Gait Ambulation/Gait assistance: Min guard Gait Distance (Feet): 25 Feet (25' x 1, 10' x 1) Assistive device: Rolling walker (2 wheeled) Gait Pattern/deviations: Step-to pattern (hop to) Gait velocity: decr Gait velocity interpretation: <1.31 ft/sec, indicative of household ambulator General Gait Details: Assist for safety/balance  Stairs            Wheelchair Mobility    Modified Rankin (Stroke Patients Only)       Balance Overall balance assessment: Needs assistance Sitting-balance support: No upper  extremity supported;Feet supported Sitting balance-Leahy Scale: Good     Standing balance support: Bilateral upper extremity supported;Single extremity supported;During functional activity Standing balance-Leahy Scale: Poor Standing balance comment: walker and supervision for static standing                             Pertinent Vitals/Pain Pain Assessment: Faces Pain Score: 6  Faces Pain Scale: No hurt Pain Location: back Pain Descriptors / Indicators:  (straining) Pain Intervention(s): Limited activity within patient's tolerance;Monitored during session;Repositioned    Home Living Family/patient expects to be discharged to:: Assisted living (at long term care facility)               Home Equipment: Gilford Rile - 2 wheels;Wheelchair - manual Additional Comments: reports from [employer]    Prior Function Level of Independence: Needs assistance   Gait / Transfers Assistance Needed: using WC for most mobility, transfers independently  ADL's / Homemaking Assistance Needed: independent ADLs from [facility] level, washes up at the sink        Hand Dominance   Dominant Hand: Left    Extremity/Trunk Assessment   Upper Extremity Assessment Upper Extremity Assessment: Defer to OT evaluation    Lower Extremity Assessment Lower Extremity Assessment: Generalized weakness;LLE deficits/detail LLE Deficits / Details: AKA       Communication   Communication: No difficulties  Cognition Arousal/Alertness: Awake/alert Behavior During Therapy: WFL for tasks assessed/performed Overall Cognitive Status: Within Functional Limits for tasks assessed  General Comments General comments (skin integrity, edema, etc.): VSS    Exercises     Assessment/Plan    PT Assessment Patient needs continued PT services  PT Problem List Decreased strength;Decreased balance;Decreased mobility       PT Treatment Interventions DME  instruction;Gait training;Functional mobility training;Therapeutic activities;Therapeutic exercise;Balance training;Patient/family education    PT Goals (Current goals can be found in the Care Plan section)  Acute Rehab PT Goals Patient Stated Goal: get stronger PT Goal Formulation: With patient Time For Goal Achievement: 02/10/21 Potential to Achieve Goals: Good    Frequency Min 2X/week   Barriers to discharge        Co-evaluation               AM-PAC PT "6 Clicks" Mobility  Outcome Measure Help needed turning from your back to your side while in a flat bed without using bedrails?: None Help needed moving from lying on your back to sitting on the side of a flat bed without using bedrails?: None Help needed moving to and from a bed to a chair (including a wheelchair)?: A Little Help needed standing up from a chair using your arms (e.g., wheelchair or bedside chair)?: A Little Help needed to walk in hospital room?: A Little Help needed climbing 3-5 steps with a railing? : Total 6 Click Score: 18    End of Session   Activity Tolerance: Patient tolerated treatment well Patient left: in chair;with call bell/phone within reach;with chair alarm set   PT Visit Diagnosis: Other abnormalities of gait and mobility (R26.89);Muscle weakness (generalized) (M62.81)    Time: 1019-1040 PT Time Calculation (min) (ACUTE ONLY): 21 min   Charges:   PT Evaluation $PT Eval Moderate Complexity: [place] Pager [phone] Office [place] 01/26/2021, 12:45 PM

## 2021-01-26 NOTE — Progress Notes (Signed)
Patient dialysis has been rescheduled for tomorrow.

## 2021-01-27 DIAGNOSIS — Z992 Dependence on renal dialysis: Secondary | ICD-10-CM | POA: Diagnosis not present

## 2021-01-27 DIAGNOSIS — I38 Endocarditis, valve unspecified: Secondary | ICD-10-CM | POA: Diagnosis not present

## 2021-01-27 DIAGNOSIS — I5020 Unspecified systolic (congestive) heart failure: Secondary | ICD-10-CM | POA: Diagnosis not present

## 2021-01-27 DIAGNOSIS — A419 Sepsis, unspecified organism: Secondary | ICD-10-CM | POA: Diagnosis not present

## 2021-01-27 DIAGNOSIS — N186 End stage renal disease: Secondary | ICD-10-CM | POA: Diagnosis not present

## 2021-01-27 DIAGNOSIS — D631 Anemia in chronic kidney disease: Secondary | ICD-10-CM | POA: Diagnosis not present

## 2021-01-27 DIAGNOSIS — E8779 Other fluid overload: Secondary | ICD-10-CM | POA: Diagnosis not present

## 2021-01-27 DIAGNOSIS — R0600 Dyspnea, unspecified: Secondary | ICD-10-CM | POA: Diagnosis not present

## 2021-01-27 DIAGNOSIS — I132 Hypertensive heart and chronic kidney disease with heart failure and with stage 5 chronic kidney disease, or end stage renal disease: Secondary | ICD-10-CM | POA: Diagnosis not present

## 2021-01-27 DIAGNOSIS — T826XXD Infection and inflammatory reaction due to cardiac valve prosthesis, subsequent encounter: Secondary | ICD-10-CM | POA: Diagnosis not present

## 2021-01-27 LAB — CBC
HCT: 33.4 % — ABNORMAL LOW (ref 39.0–52.0)
Hemoglobin: 10.3 g/dL — ABNORMAL LOW (ref 13.0–17.0)
MCH: 25.9 pg — ABNORMAL LOW (ref 26.0–34.0)
MCHC: 30.8 g/dL (ref 30.0–36.0)
MCV: 84.1 fL (ref 80.0–100.0)
Platelets: 158 10*3/uL (ref 150–400)
RBC: 3.97 MIL/uL — ABNORMAL LOW (ref 4.22–5.81)
RDW: 24.9 % — ABNORMAL HIGH (ref 11.5–15.5)
WBC: 10.3 10*3/uL (ref 4.0–10.5)
nRBC: 1 % — ABNORMAL HIGH (ref 0.0–0.2)

## 2021-01-27 LAB — BASIC METABOLIC PANEL
Anion gap: 14 (ref 5–15)
BUN: 22 mg/dL — ABNORMAL HIGH (ref 6–20)
CO2: 26 mmol/L (ref 22–32)
Calcium: 7.8 mg/dL — ABNORMAL LOW (ref 8.9–10.3)
Chloride: 92 mmol/L — ABNORMAL LOW (ref 98–111)
Creatinine, Ser: 4.73 mg/dL — ABNORMAL HIGH (ref 0.61–1.24)
GFR, Estimated: 14 mL/min — ABNORMAL LOW (ref >60)
Glucose, Bld: 78 mg/dL (ref 70–99)
Potassium: 3.4 mmol/L — ABNORMAL LOW (ref 3.5–5.1)
Sodium: 132 mmol/L — ABNORMAL LOW (ref 135–145)

## 2021-01-27 LAB — CULTURE, BLOOD (ROUTINE X 2)

## 2021-01-27 LAB — SARS CORONAVIRUS 2 (TAT 6-24 HRS): SARS Coronavirus 2: NEGATIVE

## 2021-01-27 MED ORDER — ATORVASTATIN CALCIUM 40 MG PO TABS: 40.0000 mg | ORAL_TABLET | Freq: Every day | ORAL | 0 refills | Status: AC

## 2021-01-27 MED ORDER — OXYCODONE HCL ER 15 MG PO T12A: 15.0000 mg | EXTENDED_RELEASE_TABLET | Freq: Two times a day (BID) | ORAL | 0 refills | Status: AC

## 2021-01-27 NOTE — Discharge Summary (Signed)
Physician Discharge Summary  Patrick Anthony [id number] DOB: 01/17/1971 DOA: 01/23/2021  PCP: [name], MD  Admit date: 01/23/2021 Discharge date: 01/27/2021  Admitted From: SNF Disposition:  SNF  Recommendations for Outpatient Follow-up:  Continue outpatient goals of care conversation with palliative care medicine. He has significant valvular damage and is not a surgical candidate. Recommend DNR and hospice by multiple physicians during hospital stay.  Pt continues to wish to continue HD and IV antibiotics. Continue vancomycin with HD and follow up in 3-4 weeks with ID.   Discharge Condition: Poor prognosis overall CODE STATUS: Full code for now  Diet recommendation: Renal diet   Brief/Interim Summary: Patrick Anthony is a 50 y.o. 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  He was recommended to have 6 weeks of Vanc/Rifampin treatment (through 7/8) and completed 2 weeks of gentamicin treatment.  CT surgery determined that he is not a candidate for redo valve replacement.  He reports since d/c he has noticed what "feels like heartburn" periodically - particularly at HD sessions.  He noticed it today and it lasted a long time so he decided to come in for evaluation.  He is noticing some SOB, including PND and periodic orthopnea.  No fever or cough.  Patient was admitted for chest pain, cardiology, infectious disease,  nephrology also consulted.  Echocardiogram revealed rocking, dehiscence, stenosis and fracture/perforation the mitral prosthesis.  Cardiothoracic surgery was consulted again, unfortunately patient is not a candidate for redo valve replacement.  Multiple providers have recommended palliative care/hospice.  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  He is status post mitral valve replacement, right AKA. -Infectious disease following. Continue vancomycin for now  -Echocardiogram revealed rocking, dehiscence, stenosis and fracture/perforation the mitral prosthesis. -Per CT surgery, patient not a candidate for redo surgery -Palliative care following for goals of care conversation   Demand ischemia Chest pain -Echocardiogram as below  -Cardiology consulted, suspected to be demand ischemia in setting of underlying infectious process, metabolic derangement -Continue aspirin, statin -Cardiology signed off 6/21 no further cardiac work-up planned   Atrial tachycardia -Continue cardizem, lopressor   ESRD -HD MWF -Nephrology following   Cirrhosis with hepatitis C -Will need outpatient follow-up, depending on goals of care conversation      Discharge Instructions  Discharge Instructions     Increase activity slowly   Complete by: As directed       Allergies as of 01/27/2021   No Known Allergies      Medication List     STOP taking these medications    rifampin 300 MG capsule Commonly known as: RIFADIN  TAKE these medications    (feeding supplement) PROSource Plus  liquid Take 30 mLs by mouth 2 (two) times daily between meals.   feeding supplement (NEPRO CARB STEADY) Liqd Take 237 mLs by mouth 3 (three) times daily between meals.   acetaminophen 325 MG tablet Commonly known as: TYLENOL Take 325-650 mg by mouth every 6 (six) hours as needed for mild pain or fever. Temp > 100.5   ARANESP (ALBUMIN FREE) IJ Darbepoetin Alfa (Aranesp)   aspirin 325 MG EC tablet Take 1 tablet (325 mg total) by mouth daily.   atorvastatin 40 MG tablet Commonly known as: LIPITOR Take 1 tablet (40 mg total) by mouth daily.   bisacodyl 10 MG suppository Commonly known as: DULCOLAX Place 1 suppository (10 mg total) rectally daily as needed for moderate constipation.   calcium acetate 667 MG capsule Commonly known as: PHOSLO Take 667 mg by mouth 3 (three) times daily with meals.   cinacalcet 30 MG tablet Commonly known as: Sensipar Take 2 tablets (60 mg total) by mouth daily with breakfast.   diltiazem 120 MG 24 hr capsule Commonly known as: CARDIZEM CD Take 1 capsule (120 mg total) by mouth daily.   docusate sodium 100 MG capsule Commonly known as: COLACE Take 1 capsule (100 mg total) by mouth 2 (two) times daily.   methocarbamol 500 MG tablet Commonly known as: ROBAXIN Take 1 tablet (500 mg total) by mouth 3 (three) times daily.   metoprolol tartrate 50 MG tablet Commonly known as: LOPRESSOR Take 0.5 tablets (25 mg total) by mouth 2 (two) times daily.   multivitamin with minerals Tabs tablet Take 1 tablet by mouth daily.   nitroGLYCERIN 0.4 MG SL tablet Commonly known as: NITROSTAT Place 0.4 mg under the tongue every 5 (five) minutes x 3 doses as needed for chest pain.   oxyCODONE 15 mg 12 hr tablet Commonly known as: OXYCONTIN Take 1 tablet (15 mg total) by mouth every 12 (twelve) hours for 7 days.   pantoprazole 40 MG tablet Commonly known as: PROTONIX Take 1 tablet (40 mg total) by mouth daily.   polyethylene glycol 17 g packet Commonly  known as: MIRALAX / GLYCOLAX Take 17 g by mouth daily as needed for mild constipation.   pregabalin 75 MG capsule Commonly known as: LYRICA Take 1 capsule (75 mg total) by mouth at bedtime.   senna-docusate 8.6-50 MG tablet Commonly known as: Senokot-S Take 1 tablet by mouth 2 (two) times daily.   sevelamer carbonate 800 MG tablet Commonly known as: RENVELA Take 1,600 mg by mouth 3 (three) times daily with meals.   Vancomycin 750-5 MG/150ML-% Soln Commonly known as: VANCOCIN Inject 150 mLs (750 mg total) into the vein every Monday, Wednesday, and Friday with hemodialysis. 6 weeks of vancomycin from 5/27 What changed:  when to take this additional instructions        Follow-up Information     Vu, Rockey Situ, MD Follow up on 03/02/2021.   Specialty: Infectious Diseases Why: virtual visit follow up at 4:00 pm Contact information: [place] [address] [phone]                No Known Allergies  Consultations: Infectious disease Cardiology Nephrology Cardiothoracic surgery Palliative care medicine   Procedures/Studies: CT Angio Chest PE W and/or Wo Contrast  Result Date: 01/23/2021 CLINICAL DATA:  Patient with chest pain. EXAM: CT ANGIOGRAPHY CHEST WITH CONTRAST TECHNIQUE: Multidetector CT imaging of the chest was performed using the  standard protocol during bolus administration of intravenous contrast. Multiplanar CT image reconstructions and MIPs were obtained to evaluate the vascular anatomy. CONTRAST:  32m OMNIPAQUE IOHEXOL 350 MG/ML SOLN COMPARISON:  CT chest 09/29/2020. FINDINGS: Cardiovascular: Heart is enlarged, particularly right heart. There is reflux of contrast into the hepatic veins. Small pericardial effusion. Thoracic aortic vascular calcifications. Adequate opacification of the pulmonary arteries. Motion artifact limits evaluation of the distal pulmonary arteries. Within the above limitation, no evidence for acute  pulmonary embolus. Mediastinum/Nodes: No enlarged axillary, mediastinal or hilar lymphadenopathy. Normal appearance of the esophagus. Lungs/Pleura: Central airways are patent. There are small bilateral pleural effusions. The right pleural effusion appears partially loculated. Bandlike patchy ground-glass and consolidative opacities throughout the lungs bilaterally. Upper Abdomen: Unremarkable. Musculoskeletal: Thoracic spine degenerative changes. There is osseous destruction of the superior endplate of the T624THLvertebral body. There is loss of disc space height at the T10-T11 level. This is most compatible with discitis osteomyelitis is demonstrated on prior thoracic spine MRI 12/25/2020. Review of the MIP images confirms the above findings. IMPRESSION: 1. No evidence for acute pulmonary embolus. 2. Bilateral pleural effusions. The right pleural effusion appears to be partially loculated. There are scattered bandlike consolidative opacities which may represent atelectasis and or infection. 3. T10-T11 discitis osteomyelitis as demonstrated on prior MRI of the thoracic spine 12/25/2020. 4. The right heart is enlarged. Secondary findings suggestive of right heart failure including reflux of contrast into the hepatic veins. 5. Aortic Atherosclerosis (ICD10-I70.0). Electronically Signed   By: DLovey NewcomerM.D.   On: 01/23/2021 12:45   DG Hand 2 View Right  Result Date: 12/31/2020 CLINICAL DATA:  Pain at first digit EXAM: RIGHT HAND - 2 VIEW COMPARISON:  None. FINDINGS: Frontal and lateral views of the right hand are obtained. No fracture, subluxation, or dislocation. There is mild joint space narrowing at the first metacarpophalangeal joint, first interphalangeal joint, and throughout the remaining distal interphalangeal joints consistent with osteoarthritis. Vascular calcifications are seen throughout the hand and wrist. Soft tissues are otherwise unremarkable. IMPRESSION: 1. Mild osteoarthritis greatest in the first  digit. 2. No acute bony abnormality. Electronically Signed   By: MRanda NgoM.D.   On: 12/31/2020 19:27   DG Chest Portable 1 View  Result Date: 01/23/2021 CLINICAL DATA:  Chest pain. EXAM: PORTABLE CHEST 1 VIEW COMPARISON:  Prior chest radiographs 12/22/2020 and earlier. FINDINGS: Cardiomegaly. Prior cardiac valve replacement. Central pulmonary vascular congestion. Patchy bilateral airspace opacities. Redemonstrated architectural distortion within the right mid lung. Right greater than left pleural effusions, increased from the prior chest radiograph of 12/22/2020. The right pleural effusion may be loculated. No evidence of pneumothorax. No acute bony abnormality identified. A vascular stent projects in the left subclavicular region. IMPRESSION: Cardiomegaly with pulmonary vascular congestion. Patchy bilateral pulmonary opacities have progressed as compared to the chest radiograph of 12/22/2020 and are favored to reflect pulmonary edema. Pneumonia considered less likely, although not excluded. Progressive right greater than left pleural effusions. The right pleural effusion may be loculated. Redemonstrated architectural distortion within the right mid lung. Electronically Signed   By: KKellie SimmeringDO   On: 01/23/2021 10:26   ECHOCARDIOGRAM COMPLETE  Result Date: 01/24/2021    ECHOCARDIOGRAM REPORT   Patient Name:   Patrick MEMONDate of Exam: 01/24/2021 Medical Rec #:  [id number]     Height:       72.0 in Accession #:    [id number]    Weight:       144.4  lb Date of Birth:  05-17-1971      BSA:          1.855 m Patient Age:    20 years       BP:           118/90 mmHg Patient Gender: M              HR:           92 bpm. Exam Location:  Inpatient Procedure: 2D Echo, 3D Echo, Cardiac Doppler and Color Doppler Indications:    NSTEMI  History:        Patient has prior history of Echocardiogram examinations, most                 recent 12/24/2020. Risk Factors:Hypertension. S/P PFO closure.                 S/P  MVR (31 mm Medtronic bioprosthetic valve present in the                 mitral position. ESRD.                  Mitral Valve: 31 mm Mosaic bioprosthetic valve valve is present                 in the mitral position.  Sonographer:    Clayton Lefort RDCS (AE) Referring Phys: [id number] [place]  1. Left ventricular ejection fraction, by estimation, is 60 to 65%. The left ventricle has normal function. The left ventricle has no regional wall motion abnormalities. There is severe left ventricular hypertrophy. Left ventricular diastolic parameters  are indeterminate.  2. Right ventricular systolic function is normal. The right ventricular size is mildly enlarged. There is moderately elevated pulmonary artery systolic pressure. The estimated right ventricular systolic pressure is 0000000 mmHg.  3. Right atrial size was severely dilated.  4. 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 The mitral valve has been repaired/replaced. Trivial mitral valve regurgitation. Severe mitral stenosis. The mean mitral valve gradient is 12.0 mmHg. There is a 31 mm Mosaic bioprosthetic valve present in the mitral position. Echo findings are consistent with rocking, dehiscence, stenosis and fracture/perforation the mitral prosthesis.  5. Eccentric TR directed at the intraventricular septum. The tricuspid valve is abnormal. Tricuspid valve regurgitation is moderate to severe.  6. The aortic valve is tricuspid. Aortic valve regurgitation is not visualized. Mild aortic valve sclerosis is present, with no evidence of aortic valve stenosis. Aortic valve  mean gradient measures 3.0 mmHg.  7. The inferior vena cava is dilated in size with <50% respiratory variability, suggesting right atrial pressure of 15 mmHg. Comparison(s): Changes from prior study are noted. 12/24/2020: (TEE) - LVEF 60-65%, mitral valve prosthesis with vegetation noted. I previous performed the TEE - there has been significant progression of endocarditis of the valve with new valve instability that was not previously noted on 12/24/20. Conclusion(s)/Recommendation(s): Critical findings reported to Dr. Audie Box and acknowledged at 12:30 pm. FINDINGS  Left Ventricle: Left ventricular ejection fraction, by estimation, is 60 to 65%. The left ventricle has normal function. The left ventricle has no  regional wall motion abnormalities. The left ventricular internal cavity size was normal in size. There is  severe left ventricular hypertrophy. Left ventricular diastolic function could not be evaluated due to mitral valve replacement. Left ventricular diastolic parameters are indeterminate. Right Ventricle: The right ventricular size is mildly enlarged. No increase in right ventricular wall thickness. Right ventricular systolic function is normal. There is moderately elevated pulmonary artery systolic pressure. The tricuspid regurgitant velocity is 3.31 m/s, and with an assumed right atrial pressure of 15 mmHg, the estimated right ventricular systolic pressure is 0000000 mmHg. Left Atrium: Left atrial size was normal in size. Right Atrium: Right atrial size was severely dilated. Pericardium: There is no evidence of pericardial effusion. 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 The mitral valve has been repaired/replaced. Trivial mitral valve regurgitation. There is a 31 mm Mosaic bioprosthetic valve present in the mitral position. Echo findings are consistent with abnormal rocking of, dehiscence of, stenosis of and fracture/perforation of the mitral prosthesis. Severe mitral valve stenosis. MV peak gradient, 22.1 mmHg. The mean mitral valve gradient is 12.0 mmHg with average heart rate of 91 bpm. Tricuspid Valve: Eccentric TR directed at the intraventricular septum. The tricuspid valve is abnormal. Tricuspid valve regurgitation is moderate to severe. Aortic Valve: The aortic valve is tricuspid. Aortic valve regurgitation is not visualized. Mild aortic valve sclerosis is present, with no evidence of aortic valve stenosis. Aortic valve mean gradient measures 3.0 mmHg. Aortic valve peak gradient measures 7.7 mmHg. Aortic valve area, by VTI measures 1.61 cm. Pulmonic Valve: The pulmonic valve was grossly normal. Pulmonic valve regurgitation is trivial. Aorta: The aortic root and ascending aorta are structurally normal, with no evidence of dilitation. Venous: The inferior vena cava is dilated in size with less than 50% respiratory variability, suggesting right atrial pressure of 15 mmHg. IAS/Shunts: No atrial level shunt detected by color flow Doppler.  LEFT VENTRICLE PLAX 2D LVIDd:         3.90 cm LVIDs:         2.80 cm LV PW:         1.70 cm LV IVS:        1.70 cm LVOT diam:     1.90 cm LV SV:         31 LV SV Index:   17 LVOT Area:     2.84 cm  RIGHT VENTRICLE             IVC RV Basal diam:  3.90 cm     IVC diam: 2.30 cm RV Mid diam:    3.30 cm RV S prime:     10.30 cm/s TAPSE (M-mode): 1.6 cm LEFT ATRIUM             Index       RIGHT ATRIUM           Index LA diam:        4.60 cm 2.48 cm/m  RA Area:     27.50 cm LA Vol (A2C):   56.2 ml 30.29 ml/m RA Volume:   96.70 ml  52.13 ml/m LA Vol (A4C):   47.0  ml 25.34 ml/m LA  Biplane Vol: 52.8 ml 28.46 ml/m  AORTIC VALVE AV Area (Vmax):    1.45 cm AV Area (Vmean):   1.38 cm AV Area (VTI):     1.61 cm AV Vmax:           139.00 cm/s AV Vmean:          85.000 cm/s AV VTI:            0.192 m AV Peak Grad:      7.7 mmHg AV Mean Grad:      3.0 mmHg LVOT Vmax:         70.90 cm/s LVOT Vmean:        41.500 cm/s LVOT VTI:          0.109 m LVOT/AV VTI ratio: 0.57  AORTA Ao Root diam: 3.70 cm Ao Asc diam:  3.70 cm MITRAL VALVE             TRICUSPID VALVE MV Area VTI:  0.59 cm   TR Peak grad:   43.8 mmHg MV Peak grad: 22.1 mmHg  TR Vmax:        331.00 cm/s MV Mean grad: 12.0 mmHg MV Vmax:      2.35 m/s   SHUNTS MV Vmean:     170.0 cm/s Systemic VTI:  0.11 m                          Systemic Diam: 1.90 cm Lyman Bishop MD Electronically signed by Lyman Bishop MD Signature Date/Time: 01/24/2021/12:32:23 PM    Final        Discharge Exam: Vitals:   01/26/21 2342 01/27/21 0520  BP: (!) 122/93 111/86  Pulse: 80 81  Resp: 16 16  Temp: 98 F (36.7 C) 97.8 F (36.6 C)  SpO2: 97% 99%    General: Pt is alert, awake, not in acute distress Cardiovascular: RRR, S1/S2 +, no edema, +murmur  Respiratory: CTA bilaterally, no wheezing, no rhonchi, no respiratory distress, no conversational dyspnea  Abdominal: Soft, NT, ND, bowel sounds + Extremities: no edema, no cyanosis, right AKA  Psych: Normal mood and affect, stable judgement and insight     The results of significant diagnostics from this hospitalization (including imaging, microbiology, ancillary and laboratory) are listed below for reference.     Microbiology: Recent Results (from the past 240 hour(s))  Blood Culture (routine x 2)     Status: Abnormal (Preliminary result)   Collection Time: 01/23/21  4:20 AM   Specimen: BLOOD  Result Value Ref Range Status   Specimen Description BLOOD SITE NOT SPECIFIED  Final   Special Requests   Final    BOTTLES DRAWN AEROBIC ONLY Blood Culture results may not be optimal due to an  inadequate volume of blood received in culture bottles   Culture  Setup Time   Final    GRAM POSITIVE COCCI IN CLUSTERS AEROBIC BOTTLE ONLY CRITICAL VALUE NOTED.  VALUE IS CONSISTENT WITH PREVIOUSLY REPORTED AND CALLED VALUE.    Culture (A)  Final    STAPHYLOCOCCUS EPIDERMIDIS THE SIGNIFICANCE OF ISOLATING THIS ORGANISM FROM A SINGLE SET OF BLOOD CULTURES WHEN MULTIPLE SETS ARE DRAWN IS UNCERTAIN. PLEASE NOTIFY THE MICROBIOLOGY DEPARTMENT WITHIN ONE WEEK IF SPECIATION AND SENSITIVITIES ARE REQUIRED. Performed at [facility], [place] [address] [place], [place] [address]    Report Status PENDING  Incomplete  Resp Panel by RT-PCR (Flu A&B, Covid) Nasopharyngeal Swab  Status: None   Collection Time: 01/23/21 11:19 AM   Specimen: Nasopharyngeal Swab; Nasopharyngeal(NP) swabs in vial transport medium  Result Value Ref Range Status   SARS Coronavirus 2 by RT PCR NEGATIVE NEGATIVE Final    Comment: (NOTE) SARS-CoV-2 target nucleic acids are NOT DETECTED.  The SARS-CoV-2 RNA is generally detectable in upper respiratory specimens during the acute phase of infection. The lowest concentration of SARS-CoV-2 viral copies this assay can detect is 138 copies/mL. A negative result does not preclude SARS-Cov-2 infection and should not be used as the sole basis for treatment or other patient management decisions. A negative result may occur with  improper specimen collection/handling, submission of specimen other than nasopharyngeal swab, presence of viral mutation(s) within the areas targeted by this assay, and inadequate number of viral copies(<138 copies/mL). A negative result must be combined with clinical observations, patient history, and epidemiological information. The expected result is Negative.  Fact Sheet for Patients:  EntrepreneurPulse.com.au  Fact Sheet for Healthcare Providers:  IncredibleEmployment.be  This test is no t yet approved or  cleared by the [place] FDA and  has been authorized for detection and/or diagnosis of SARS-CoV-2 by FDA under an Emergency Use Authorization (EUA). This EUA will remain  in effect (meaning this test can be used) for the duration of the COVID-19 declaration under Section 564(b)(1) of the Act, 21 U.S.C.section 360bbb-3(b)(1), unless the authorization is terminated  or revoked sooner.       Influenza A by PCR NEGATIVE NEGATIVE Final   Influenza B by PCR NEGATIVE NEGATIVE Final    Comment: (NOTE) The Xpert Xpress SARS-CoV-2/FLU/RSV plus assay is intended as an aid in the diagnosis of influenza from Nasopharyngeal swab specimens and should not be used as a sole basis for treatment. Nasal washings and aspirates are unacceptable for Xpert Xpress SARS-CoV-2/FLU/RSV testing.  Fact Sheet for Patients: EntrepreneurPulse.com.au  Fact Sheet for Healthcare Providers: IncredibleEmployment.be  This test is not yet approved or cleared by the [place] FDA and has been authorized for detection and/or diagnosis of SARS-CoV-2 by FDA under an Emergency Use Authorization (EUA). This EUA will remain in effect (meaning this test can be used) for the duration of the COVID-19 declaration under Section 564(b)(1) of the Act, 21 U.S.C. section 360bbb-3(b)(1), unless the authorization is terminated or revoked.  Performed at [facility], [place] [address] [place], [place] [address]      Labs: BNP (last 3 results) No results for input(s): BNP in the last 8760 hours. Basic Metabolic Panel: Recent Labs  Lab 01/23/21 0920 01/24/21 0416 01/25/21 0314 01/26/21 0245 01/27/21 0247  NA 133* 133* 130* 129* 132*  K 4.1 3.9 4.0 4.1 3.4*  CL 91* 93* 89* 88* 92*  CO2 '22 24 23 '$ 21* 26  GLUCOSE 85 102* 89 75 78  BUN 25* 15 32* 46* 22*  CREATININE 6.32* 4.40* 6.05* 7.36* 4.73*  CALCIUM 8.1* 7.8* 7.8* 7.7* 7.8*   Liver Function Tests: Recent Labs  Lab  01/23/21 0920  AST 86*  ALT 32  ALKPHOS 82  BILITOT 3.9*  PROT 8.6*  ALBUMIN 2.3*   No results for input(s): LIPASE, AMYLASE in the last 168 hours. No results for input(s): AMMONIA in the last 168 hours. CBC: Recent Labs  Lab 01/23/21 0920 01/24/21 0416 01/25/21 0314 01/26/21 0245 01/27/21 0247  WBC 17.5* 19.7* 15.4* 14.6* 10.3  NEUTROABS 14.6*  --   --   --   --   HGB 9.4* 9.6* 9.5* 9.8* 10.3*  HCT 33.8* 33.4* 32.5* 33.0* 33.4*  MCV 91.6 87.7 87.6 86.6 84.1  PLT 259 229 205 173 158   Cardiac Enzymes: No results for input(s): CKTOTAL, CKMB, CKMBINDEX, TROPONINI in the last 168 hours. BNP: Invalid input(s): POCBNP CBG: No results for input(s): GLUCAP in the last 168 hours. D-Dimer No results for input(s): DDIMER in the last 72 hours. Hgb A1c No results for input(s): HGBA1C in the last 72 hours. Lipid Profile No results for input(s): CHOL, HDL, LDLCALC, TRIG, CHOLHDL, LDLDIRECT in the last 72 hours. Thyroid function studies No results for input(s): TSH, T4TOTAL, T3FREE, THYROIDAB in the last 72 hours.  Invalid input(s): FREET3 Anemia work up No results for input(s): VITAMINB12, FOLATE, FERRITIN, TIBC, IRON, RETICCTPCT in the last 72 hours. Urinalysis    Component Value Date/Time   COLORURINE YELLOW 08/28/2013 1426   APPEARANCEUR HAZY (A) 08/28/2013 1426   LABSPEC 1.017 08/28/2013 1426   PHURINE 6.0 08/28/2013 1426   GLUCOSEU 100 (A) 08/28/2013 1426   HGBUR SMALL (A) 08/28/2013 1426   BILIRUBINUR NEGATIVE 08/28/2013 1426   KETONESUR NEGATIVE 08/28/2013 1426   PROTEINUR >300 (A) 08/28/2013 1426   UROBILINOGEN 0.2 08/28/2013 1426   NITRITE NEGATIVE 08/28/2013 1426   LEUKOCYTESUR SMALL (A) 08/28/2013 1426   Sepsis Labs Invalid input(s): PROCALCITONIN,  WBC,  LACTICIDVEN Microbiology Recent Results (from the past 240 hour(s))  Blood Culture (routine x 2)     Status: Abnormal (Preliminary result)   Collection Time: 01/23/21  4:20 AM   Specimen: BLOOD  Result  Value Ref Range Status   Specimen Description BLOOD SITE NOT SPECIFIED  Final   Special Requests   Final    BOTTLES DRAWN AEROBIC ONLY Blood Culture results may not be optimal due to an inadequate volume of blood received in culture bottles   Culture  Setup Time   Final    GRAM POSITIVE COCCI IN CLUSTERS AEROBIC BOTTLE ONLY CRITICAL VALUE NOTED.  VALUE IS CONSISTENT WITH PREVIOUSLY REPORTED AND CALLED VALUE.    Culture (A)  Final    STAPHYLOCOCCUS EPIDERMIDIS THE SIGNIFICANCE OF ISOLATING THIS ORGANISM FROM A SINGLE SET OF BLOOD CULTURES WHEN MULTIPLE SETS ARE DRAWN IS UNCERTAIN. PLEASE NOTIFY THE MICROBIOLOGY DEPARTMENT WITHIN ONE WEEK IF SPECIATION AND SENSITIVITIES ARE REQUIRED. Performed at [facility], [place] [address] [place], [place] [address]    Report Status PENDING  Incomplete  Resp Panel by RT-PCR (Flu A&B, Covid) Nasopharyngeal Swab     Status: None   Collection Time: 01/23/21 11:19 AM   Specimen: Nasopharyngeal Swab; Nasopharyngeal(NP) swabs in vial transport medium  Result Value Ref Range Status   SARS Coronavirus 2 by RT PCR NEGATIVE NEGATIVE Final    Comment: (NOTE) SARS-CoV-2 target nucleic acids are NOT DETECTED.  The SARS-CoV-2 RNA is generally detectable in upper respiratory specimens during the acute phase of infection. The lowest concentration of SARS-CoV-2 viral copies this assay can detect is 138 copies/mL. A negative result does not preclude SARS-Cov-2 infection and should not be used as the sole basis for treatment or other patient management decisions. A negative result may occur with  improper specimen collection/handling, submission of specimen other than nasopharyngeal swab, presence of viral mutation(s) within the areas targeted by this assay, and inadequate number of viral copies(<138 copies/mL). A negative result must be combined with clinical observations, patient history, and epidemiological information. The expected result is  Negative.  Fact Sheet for Patients:  EntrepreneurPulse.com.au  Fact Sheet for Healthcare Providers:  IncredibleEmployment.be  This test is  no t yet approved or cleared by the [place] and  has been authorized for detection and/or diagnosis of SARS-CoV-2 by FDA under an Emergency Use Authorization (EUA). This EUA will remain  in effect (meaning this test can be used) for the duration of the COVID-19 declaration under Section 564(b)(1) of the Act, 21 U.S.C.section 360bbb-3(b)(1), unless the authorization is terminated  or revoked sooner.       Influenza A by PCR NEGATIVE NEGATIVE Final   Influenza B by PCR NEGATIVE NEGATIVE Final    Comment: (NOTE) The Xpert Xpress SARS-CoV-2/FLU/RSV plus assay is intended as an aid in the diagnosis of influenza from Nasopharyngeal swab specimens and should not be used as a sole basis for treatment. Nasal washings and aspirates are unacceptable for Xpert Xpress SARS-CoV-2/FLU/RSV testing.  Fact Sheet for Patients: EntrepreneurPulse.com.au  Fact Sheet for Healthcare Providers: IncredibleEmployment.be  This test is not yet approved or cleared by the [place] FDA and has been authorized for detection and/or diagnosis of SARS-CoV-2 by FDA under an Emergency Use Authorization (EUA). This EUA will remain in effect (meaning this test can be used) for the duration of the COVID-19 declaration under Section 564(b)(1) of the Act, 21 U.S.C. section 360bbb-3(b)(1), unless the authorization is terminated or revoked.  Performed at [facility], [place] [address] [place], [place] [address]      Patient was seen and examined on the day of discharge and was found to be in stable condition. Time coordinating discharge: 35 minutes including assessment and coordination of care, as well as examination of the patient.   SIGNED:  Dessa Phi, DO [facility] 01/27/2021, 9:32 AM

## 2021-01-27 NOTE — Progress Notes (Signed)
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  No new complaints today  Working with PT and OT Possibly to go back to SNF today ?  Ended up getting HD last night - removed 2000 tolerated well   06/22 0701 - 06/23 0700 In: 420 [P.O.:420] Out: 2000   Filed Weights   01/26/21 1935 01/26/21 2310 01/27/21 0520  Weight: 68.6 kg 66.6 kg 66.5 kg    Scheduled Meds:  aspirin  325 mg Oral Daily   atorvastatin  40 mg Oral Daily   Chlorhexidine Gluconate Cloth  6 each Topical Q0600   cinacalcet  60 mg Oral Q breakfast   diltiazem  120 mg Oral Daily   docusate sodium  100 mg Oral BID   heparin  5,000 Units Subcutaneous Q8H   methocarbamol  500 mg Oral TID   metoprolol tartrate  25 mg Oral BID   oxyCODONE  15 mg Oral Q12H   pantoprazole  40 mg Oral Daily   pregabalin  75 mg Oral QHS   senna-docusate  1 tablet Oral BID   sevelamer carbonate  1,600 mg Oral TID WC   sodium chloride flush  3 mL Intravenous Q12H   vancomycin variable dose per unstable renal function (pharmacist dosing)   Does not apply See admin instructions   Continuous Infusions:  vancomycin     PRN Meds:.acetaminophen **OR** acetaminophen, bisacodyl, calcium carbonate (dosed in mg elemental calcium), camphor-menthol **AND** hydrOXYzine, docusate sodium, feeding supplement (NEPRO CARB STEADY), ondansetron **OR** ondansetron (ZOFRAN) IV, polyethylene glycol, sorbitol, zolpidem  Current Labs: reviewed    Physical Exam:  Blood pressure 111/86, pulse 81, temperature 97.8 F (36.6 C), resp. rate 16, height 6' (1.829 m), weight 66.5 kg, SpO2 99 %. NAD,c hronically ill appearing NCAT RRR, 3/6 MSM CTAB LUE AVG +B, bandaged S/ntn/d R BKA stump LLE no edema  Dialysis Orders:  MWF at  [facility] 4hr, 350/500, EDW 67kg, 2K/2Ca, UFP#1, AVG, heparin 5000 - Mircera 248mg Iv q 2 weeks (last '150mg'$  on 6/6) - No VDRA - Has been getting Vanc '750mg'$  x HD, does not appear to have missed any. Vanc trough 17.3 on 01/17/21.   Assessment/Plan: Progressive MV and AV MRSA IE while on ABX, not a surgical candidate, hospice recommended. [facility]ID rec Doxy at DC ESRD:  Pt struggling with stopping HD, palliative following. For now cont on HD until more symptomatic.  HD last night on schedule. Cont on MWF for now-  next planned for Friday-  possibly back to SNF to continue HD as OP     Hypertension/volume: adequate control- cardizem and lopressor  Anemia: Hgb 9s  rece ESA [id number] Metabolic bone disease: Ca ok, cont renvela- no phos in a while  Nutrition:  protein supplements.  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  Hx prosthetic valve endocarditis as above    KLouis Meckel 01/27/2021, 8:38 AM  Recent Labs  Lab 01/25/21 0314 01/26/21 0245 01/27/21 0247  NA 130* 129* 132*  K 4.0 4.1 3.4*  CL 89* 88* 92*  CO2 23 21* 26  GLUCOSE 89 75 78  BUN 32* 46* 22*  CREATININE 6.05* 7.36* 4.73*  CALCIUM 7.8* 7.7* 7.8*   Recent Labs  Lab 01/23/21 0920 01/24/21 0416 01/25/21 0314 01/26/21 0245 01/27/21  0247  WBC 17.5*   < > 15.4* 14.6* 10.3  NEUTROABS 14.6*  --   --   --   --   HGB 9.4*   < > 9.5* 9.8* 10.3*  HCT 33.8*   < > 32.5* 33.0* 33.4*  MCV 91.6   < > 87.6 86.6 84.1  PLT 259   < > 205 173 158   < > = values in this interval not displayed.

## 2021-01-28 DIAGNOSIS — R0600 Dyspnea, unspecified: Secondary | ICD-10-CM | POA: Diagnosis not present

## 2021-01-28 DIAGNOSIS — E872 Acidosis: Secondary | ICD-10-CM | POA: Diagnosis not present

## 2021-01-28 DIAGNOSIS — I050 Rheumatic mitral stenosis: Secondary | ICD-10-CM | POA: Diagnosis not present

## 2021-01-28 DIAGNOSIS — T826XXD Infection and inflammatory reaction due to cardiac valve prosthesis, subsequent encounter: Secondary | ICD-10-CM | POA: Diagnosis not present

## 2021-01-28 DIAGNOSIS — I10 Essential (primary) hypertension: Secondary | ICD-10-CM | POA: Diagnosis not present

## 2021-01-28 DIAGNOSIS — Z992 Dependence on renal dialysis: Secondary | ICD-10-CM | POA: Diagnosis not present

## 2021-01-28 DIAGNOSIS — I132 Hypertensive heart and chronic kidney disease with heart failure and with stage 5 chronic kidney disease, or end stage renal disease: Secondary | ICD-10-CM | POA: Diagnosis not present

## 2021-01-28 DIAGNOSIS — T826XXA Infection and inflammatory reaction due to cardiac valve prosthesis, initial encounter: Secondary | ICD-10-CM | POA: Diagnosis not present

## 2021-01-28 DIAGNOSIS — I38 Endocarditis, valve unspecified: Secondary | ICD-10-CM | POA: Diagnosis not present

## 2021-01-28 DIAGNOSIS — A419 Sepsis, unspecified organism: Secondary | ICD-10-CM | POA: Diagnosis not present

## 2021-01-28 DIAGNOSIS — G894 Chronic pain syndrome: Secondary | ICD-10-CM

## 2021-01-28 DIAGNOSIS — D631 Anemia in chronic kidney disease: Secondary | ICD-10-CM | POA: Diagnosis not present

## 2021-01-28 DIAGNOSIS — N186 End stage renal disease: Secondary | ICD-10-CM | POA: Diagnosis not present

## 2021-01-28 DIAGNOSIS — Z89611 Acquired absence of right leg above knee: Secondary | ICD-10-CM | POA: Diagnosis not present

## 2021-01-28 DIAGNOSIS — E8779 Other fluid overload: Secondary | ICD-10-CM | POA: Diagnosis not present

## 2021-01-28 DIAGNOSIS — I071 Rheumatic tricuspid insufficiency: Secondary | ICD-10-CM

## 2021-01-28 DIAGNOSIS — Z952 Presence of prosthetic heart valve: Secondary | ICD-10-CM | POA: Diagnosis not present

## 2021-01-28 DIAGNOSIS — I5020 Unspecified systolic (congestive) heart failure: Secondary | ICD-10-CM | POA: Diagnosis not present

## 2021-01-28 LAB — CBC
HCT: 33 % — ABNORMAL LOW (ref 39.0–52.0)
Hemoglobin: 9.9 g/dL — ABNORMAL LOW (ref 13.0–17.0)
MCH: 25.7 pg — ABNORMAL LOW (ref 26.0–34.0)
MCHC: 30 g/dL (ref 30.0–36.0)
MCV: 85.7 fL (ref 80.0–100.0)
Platelets: 139 10*3/uL — ABNORMAL LOW (ref 150–400)
RBC: 3.85 MIL/uL — ABNORMAL LOW (ref 4.22–5.81)
RDW: 25.4 % — ABNORMAL HIGH (ref 11.5–15.5)
WBC: 8.7 10*3/uL (ref 4.0–10.5)
nRBC: 0.7 % — ABNORMAL HIGH (ref 0.0–0.2)

## 2021-01-28 LAB — BASIC METABOLIC PANEL
Anion gap: 12 (ref 5–15)
BUN: 37 mg/dL — ABNORMAL HIGH (ref 6–20)
CO2: 29 mmol/L (ref 22–32)
Calcium: 7.6 mg/dL — ABNORMAL LOW (ref 8.9–10.3)
Chloride: 90 mmol/L — ABNORMAL LOW (ref 98–111)
Creatinine, Ser: 6.68 mg/dL — ABNORMAL HIGH (ref 0.61–1.24)
GFR, Estimated: 9 mL/min — ABNORMAL LOW (ref >60)
Glucose, Bld: 106 mg/dL — ABNORMAL HIGH (ref 70–99)
Potassium: 3.8 mmol/L (ref 3.5–5.1)
Sodium: 131 mmol/L — ABNORMAL LOW (ref 135–145)

## 2021-01-28 LAB — GLUCOSE, CAPILLARY: Glucose-Capillary: 109 mg/dL — ABNORMAL HIGH (ref 70–99)

## 2021-01-28 MED ORDER — VANCOMYCIN HCL 750 MG/150ML IV SOLN
INTRAVENOUS | Status: AC
  Administered 2021-01-28: 750 mg via ARTERIOVENOUS_FISTULA
  Filled 2021-01-28: qty 150

## 2021-01-28 MED ORDER — LIDOCAINE-PRILOCAINE 2.5-2.5 % EX CREA: 1.0000 "application " | TOPICAL_CREAM | CUTANEOUS | Status: DC | PRN

## 2021-01-28 MED ORDER — ALTEPLASE 2 MG IJ SOLR: 2.0000 mg | Freq: Once | INTRAMUSCULAR | Status: DC | PRN

## 2021-01-28 MED ORDER — SODIUM CHLORIDE 0.9 % IV SOLN: 100.0000 mL | INTRAVENOUS | Status: DC | PRN

## 2021-01-28 MED ORDER — PENTAFLUOROPROP-TETRAFLUOROETH EX AERO: 1.0000 "application " | INHALATION_SPRAY | CUTANEOUS | Status: DC | PRN

## 2021-01-28 MED ORDER — LIDOCAINE HCL (PF) 1 % IJ SOLN: 5.0000 mL | INTRAMUSCULAR | Status: DC | PRN

## 2021-01-28 MED ORDER — HEPARIN SODIUM (PORCINE) 1000 UNIT/ML DIALYSIS: 1000.0000 [IU] | INTRAMUSCULAR | Status: DC | PRN

## 2021-01-28 NOTE — Progress Notes (Signed)
Pharmacy Antibiotic Note  Patrick Anthony is a 50 y.o. male admitted on 01/23/2021 with sepsis, hx of MRSA bacteremia with prosthetic valve endocarditis on vancomycin + rifampin per ID planned through 7/15, completed gent tx.  Pharmacy has been consulted for vancomycin dosing.  ESRD-HD usually MWF.   Remains on vancomycin with HD - WBC 8.7, afebrile. Plan to continue MWF HD.   Plan: - Vancomycin 750 mg IV with HD - Monitor HD tolerance, cx results, clinical pic >> plan to continue until 7/15  Height: 6' (182.9 cm) Weight: 66.2 kg (145 lb 15.1 oz) IBW/kg (Calculated) : 77.6  Temp (24hrs), Avg:98 F (36.7 C), Min:97.8 F (36.6 C), Max:98.4 F (36.9 C)  Recent Labs  Lab 01/23/21 0901 01/23/21 0920 01/23/21 1119 01/24/21 0416 01/25/21 0314 01/26/21 0245 01/27/21 0247 01/28/21 0342  WBC  --    < >  --  19.7* 15.4* 14.6* 10.3 8.7  CREATININE  --    < >  --  4.40* 6.05* 7.36* 4.73* 6.68*  LATICACIDVEN 4.8*  --  4.4* 4.2*  --   --   --   --   VANCORANDOM  --   --   --  14  --   --   --   --    < > = values in this interval not displayed.     Estimated Creatinine Clearance: 12.5 mL/min (A) (by C-G formula based on SCr of 6.68 mg/dL (H)).    No Known Allergies  Thank you for allowing pharmacy to be a part of this patient's care.  Antonietta Jewel, PharmD, [place] Clinical Pharmacist  Phone: [phone] 01/28/2021 8:51 AM  Please check AMION for all [place] phone numbers After 10:00 PM, call [place] [phone]

## 2021-01-28 NOTE — TOC Progression Note (Signed)
Transition of Care [facility]) - Progression Note    Patient Details  Name: Patrick Anthony MRN: [id number] Date of Birth: Jan 25, 1971  [facility] CM/SW Contact  Reece Agar, [place] Phone Number: 01/28/2021, 10:32 AM  Clinical Narrative:    [id number]: Pt will DC back to [place] today after HD COVID test is back negative.         Expected Discharge Plan and Services           Expected Discharge Date: 01/27/21                                     Social Determinants of Health (SDOH) Interventions    Readmission Risk Interventions Readmission Risk Prevention Plan 11/05/2020  Transportation Screening Complete  Medication Review Press photographer) Complete  PCP or Specialist appointment within 3-5 days of discharge Complete  HRI or Home Care Consult Complete  SW Recovery Care/Counseling Consult Complete  Palliative Care Screening Complete  Skilled Nursing Facility Complete  Some recent data might be hidden

## 2021-01-28 NOTE — Progress Notes (Signed)
Occupational Therapy Treatment Patient Details Name: Patrick Anthony MRN: [id number] DOB: Jul 15, 1971 Today's Date: 01/28/2021    History of present illness Pt adm 6/19 with severe sepsis due to recurrent MRSA bacteremia, endocarditis. Pt not a candidate for valve redo and recommended palliative/hospice. PMH - ESRD on HD, rt AKA, MVR, chf, HTN, cirrhosis, IVDA, septic emboli   OT comments  Pt. Seen for skilled OT. Agreeable to HEP with focus on B UE strengthening.  Provided level III theraband with instructions and demo for use.  Pt. With good technique and initiated rest breaks appropriately.    Follow Up Recommendations  Home health OT;Supervision - Intermittent    Equipment Recommendations  None recommended by OT    Recommendations for Other Services      Precautions / Restrictions Precautions Precautions: Fall       Mobility Bed Mobility                    Transfers                      Balance                                           ADL either performed or assessed with clinical judgement   ADL                                               Vision       Perception     Praxis      Cognition Arousal/Alertness: Awake/alert Behavior During Therapy: WFL for tasks assessed/performed Overall Cognitive Status: Within Functional Limits for tasks assessed                                          Exercises General Exercises - Upper Extremity Shoulder Flexion: AROM;Both;Theraband;Seated;10 reps Theraband Level (Shoulder Flexion): Level 3 (Green) Shoulder Extension: AROM;Both;Seated;Theraband;10 reps Theraband Level (Shoulder Extension): Level 3 (Green) Elbow Flexion: AROM;Both;10 reps;Seated;Theraband Theraband Level (Elbow Flexion): Level 3 (Green) Elbow Extension: AROM;Both;10 reps;Seated;Theraband Theraband Level (Elbow Extension): Level 3 (Green)   Shoulder Instructions        General Comments      Pertinent Vitals/ Pain       Pain Assessment: No/denies pain  Home Living                                          Prior Functioning/Environment              Frequency  Min 2X/week        Progress Toward Goals  OT Goals(current goals can now be found in the care plan section)  Progress towards OT goals: Progressing toward goals     Plan      Co-evaluation                 AM-PAC OT "6 Clicks" Daily Activity     Outcome Measure   Help from another person eating meals?: None Help from another person taking care of personal grooming?: A Little  Help from another person toileting, which includes using toliet, bedpan, or urinal?: A Little Help from another person bathing (including washing, rinsing, drying)?: A Little Help from another person to put on and taking off regular upper body clothing?: None Help from another person to put on and taking off regular lower body clothing?: A Little 6 Click Score: 20    End of Session    OT Visit Diagnosis: Other abnormalities of gait and mobility (R26.89);Muscle weakness (generalized) (M62.81)   Activity Tolerance Patient tolerated treatment well   Patient Left in bed;with call bell/phone within reach   Nurse Communication          Time: [id number] OT Time Calculation (min): 17 min  Charges: OT General Charges $OT Visit: 1 Visit OT Treatments $Therapeutic Exercise: 8-22 mins  Sonia Baller, COTA/L Acute Rehabilitation [phone]    Tanya Nones 01/28/2021, 10:05 AM

## 2021-01-28 NOTE — Progress Notes (Signed)
Patient discharged to SNF yesterday but stayed overnight.  No acute or significant issues overnight of this morning.  Discharge summary from yesterday stands.

## 2021-01-28 NOTE — Progress Notes (Signed)
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  Did not leave last night- unclear why Working with PT and OT  06/23 0701 - 06/24 0700 In: 580 [P.O.:580] Out: -   Filed Weights   01/26/21 2310 01/27/21 0520 01/28/21 0500  Weight: 66.6 kg 66.5 kg 66.2 kg    Scheduled Meds:  aspirin  325 mg Oral Daily   atorvastatin  40 mg Oral Daily   Chlorhexidine Gluconate Cloth  6 each Topical Q0600   cinacalcet  60 mg Oral Q breakfast   diltiazem  120 mg Oral Daily   docusate sodium  100 mg Oral BID   heparin  5,000 Units Subcutaneous Q8H   methocarbamol  500 mg Oral TID   metoprolol tartrate  25 mg Oral BID   oxyCODONE  15 mg Oral Q12H   pantoprazole  40 mg Oral Daily   pregabalin  75 mg Oral QHS   senna-docusate  1 tablet Oral BID   sevelamer carbonate  1,600 mg Oral TID WC   sodium chloride flush  3 mL Intravenous Q12H   vancomycin variable dose per unstable renal function (pharmacist dosing)   Does not apply See admin instructions   Continuous Infusions:  sodium chloride     sodium chloride     vancomycin     PRN Meds:.sodium chloride, sodium chloride, acetaminophen **OR** acetaminophen, alteplase, bisacodyl, calcium carbonate (dosed in mg elemental calcium), camphor-menthol **AND** hydrOXYzine, docusate sodium, feeding supplement (NEPRO CARB STEADY), heparin, lidocaine (PF), lidocaine-prilocaine, ondansetron **OR** ondansetron (ZOFRAN) IV, pentafluoroprop-tetrafluoroeth, polyethylene glycol, sorbitol, zolpidem  Current Labs: reviewed    Physical Exam:  Blood pressure 106/65, pulse 85, temperature 98 F (36.7 C), temperature source Oral, resp. rate 16, height 6' (1.829 m), weight 66.2 kg, SpO2 98 %. NAD,c hronically ill  appearing NCAT RRR, 3/6 MSM CTAB LUE AVG +B, bandaged S/ntn/d R BKA stump LLE no edema  Dialysis Orders:  MWF at [facility] 4hr, 350/500, EDW 67kg, 2K/2Ca, UFP#1, AVG, heparin 5000 - Mircera 265mg Iv q 2 weeks (last '150mg'$  on 6/6) - No VDRA - Has been getting Vanc '750mg'$  x HD, does not appear to have missed any. Vanc trough 17.3 on 01/17/21.   Assessment/Plan: Progressive MV and AV MRSA IE while on ABX, not a surgical candidate, hospice recommended. [facility] rec Doxy at DC.  He wants to continue with HD ESRD:  Pt struggling with stopping HD, palliative following. For now cont on HD until more symptomatic.  HD Wed night on schedule. Cont on MWF for now-  next planned for today-  will need to run dialysis here  Hypertension/volume: adequate control- cardizem and lopressor  Anemia: Hgb 9s  rece ESA [id number] Metabolic bone disease: Ca ok, cont renvela- no phos in a while  Nutrition:  protein supplements.  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  Hx prosthetic valve endocarditis as above    KLouis Meckel 01/28/2021, 8:04 AM  Recent Labs  Lab 01/26/21 0245 01/27/21 0247 01/28/21 0342  NA 129* 132* 131*  K 4.1 3.4* 3.8  CL 88* 92* 90*  CO2 21* 26 29  GLUCOSE 75 78 106*  BUN 46* 22* 37*  CREATININE 7.36* 4.73* 6.68*  CALCIUM 7.7* 7.8* 7.6*   Recent Labs  Lab 01/23/21 0920  01/24/21 0416 01/26/21 0245 01/27/21 0247 01/28/21 0342  WBC 17.5*   < > 14.6* 10.3 8.7  NEUTROABS 14.6*  --   --   --   --   HGB 9.4*   < > 9.8* 10.3* 9.9*  HCT 33.8*   < > 33.0* 33.4* 33.0*  MCV 91.6   < > 86.6 84.1 85.7  PLT 259   < > 173 158 139*   < > = values in this interval not displayed.

## 2021-01-28 NOTE — TOC Transition Note (Signed)
Transition of Care [facility]) - CM/SW Discharge Note   Patient Details  Name: Patrick Anthony MRN: [id number] Date of Birth: [birth year]  Transition of Care [facility]) CM/SW Contact:  Tresa Endo Phone Number: 01/28/2021, 11:25 AM   Clinical Narrative:    Patient will DC to: [place] Anticipated DC date: 01/28/2021 Family notified: pt daughter Transport by: Corey Harold   Per MD patient ready for DC to [facility]. RN to call report prior to discharge ([phone]). RN, patient, patient's family, and facility notified of DC. Discharge Summary and FL2 sent to facility. DC packet on chart. Ambulance transport requested for patient.   CSW will sign off for now as social work intervention is no longer needed. Please consult [place] again if new needs arise.           Patient Goals and CMS Choice        Discharge Placement                       Discharge Plan and Services                                     Social Determinants of Health (SDOH) Interventions     Readmission Risk Interventions Readmission Risk Prevention Plan 11/05/2020  Transportation Screening Complete  Medication Review Press photographer) Complete  PCP or Specialist appointment within 3-5 days of discharge Complete  HRI or Home Care Consult Complete  SW Recovery Care/Counseling Consult Complete  Palliative Care Screening Complete  Skilled Nursing Facility Complete  Some recent data might be hidden

## 2021-01-28 NOTE — Progress Notes (Signed)
Patient back to room from HD, RN went to get vitals and settle patient in room, patient HD cath bleeding with blood running down his arms. Pressure applied with gauze, and wrapped. per HD nurse, site was dried when patient left her. Vitals stable. Awaiting peta to d/c patient.

## 2021-01-29 DIAGNOSIS — T826XXD Infection and inflammatory reaction due to cardiac valve prosthesis, subsequent encounter: Secondary | ICD-10-CM | POA: Diagnosis not present

## 2021-01-29 DIAGNOSIS — Z952 Presence of prosthetic heart valve: Secondary | ICD-10-CM | POA: Diagnosis not present

## 2021-01-29 DIAGNOSIS — I10 Essential (primary) hypertension: Secondary | ICD-10-CM | POA: Diagnosis not present

## 2021-01-29 DIAGNOSIS — I050 Rheumatic mitral stenosis: Secondary | ICD-10-CM | POA: Diagnosis not present

## 2021-01-29 DIAGNOSIS — Z89611 Acquired absence of right leg above knee: Secondary | ICD-10-CM | POA: Diagnosis not present

## 2021-01-29 DIAGNOSIS — I071 Rheumatic tricuspid insufficiency: Secondary | ICD-10-CM | POA: Diagnosis not present

## 2021-01-29 DIAGNOSIS — E872 Acidosis: Secondary | ICD-10-CM | POA: Diagnosis not present

## 2021-01-29 DIAGNOSIS — Z992 Dependence on renal dialysis: Secondary | ICD-10-CM | POA: Diagnosis not present

## 2021-01-29 DIAGNOSIS — T826XXA Infection and inflammatory reaction due to cardiac valve prosthesis, initial encounter: Secondary | ICD-10-CM | POA: Diagnosis not present

## 2021-01-29 DIAGNOSIS — I38 Endocarditis, valve unspecified: Secondary | ICD-10-CM | POA: Diagnosis not present

## 2021-01-29 DIAGNOSIS — N186 End stage renal disease: Secondary | ICD-10-CM | POA: Diagnosis not present

## 2021-01-29 DIAGNOSIS — G894 Chronic pain syndrome: Secondary | ICD-10-CM | POA: Diagnosis not present

## 2021-01-29 LAB — CBC
HCT: 33.7 % — ABNORMAL LOW (ref 39.0–52.0)
Hemoglobin: 10.1 g/dL — ABNORMAL LOW (ref 13.0–17.0)
MCH: 26.2 pg (ref 26.0–34.0)
MCHC: 30 g/dL (ref 30.0–36.0)
MCV: 87.5 fL (ref 80.0–100.0)
Platelets: 129 10*3/uL — ABNORMAL LOW (ref 150–400)
RBC: 3.85 MIL/uL — ABNORMAL LOW (ref 4.22–5.81)
RDW: 25.1 % — ABNORMAL HIGH (ref 11.5–15.5)
WBC: 5.9 10*3/uL (ref 4.0–10.5)
nRBC: 0.9 % — ABNORMAL HIGH (ref 0.0–0.2)

## 2021-01-29 LAB — BASIC METABOLIC PANEL
Anion gap: 10 (ref 5–15)
BUN: 19 mg/dL (ref 6–20)
CO2: 26 mmol/L (ref 22–32)
Calcium: 7.7 mg/dL — ABNORMAL LOW (ref 8.9–10.3)
Chloride: 97 mmol/L — ABNORMAL LOW (ref 98–111)
Creatinine, Ser: 4.35 mg/dL — ABNORMAL HIGH (ref 0.61–1.24)
GFR, Estimated: 16 mL/min — ABNORMAL LOW (ref >60)
Glucose, Bld: 115 mg/dL — ABNORMAL HIGH (ref 70–99)
Potassium: 3.6 mmol/L (ref 3.5–5.1)
Sodium: 133 mmol/L — ABNORMAL LOW (ref 135–145)

## 2021-01-29 NOTE — Progress Notes (Signed)
PROGRESS NOTE  Patrick Anthony [id number] DOB: [birth year]   PCP: [name], MD  Patient is from: SNF  DOA: 01/23/2021 LOS: 6  Chief complaints:  Chief Complaint  Patient presents with   Chest Pain     Brief Narrative / Interim history: 50 y.o. 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  He was recommended to have 6 weeks of Vanc/Rifampin treatment (through 7/8) and completed 2 weeks of gentamicin treatment.  CT surgery determined that he is not a candidate for redo valve replacement.  He reports since d/c he has noticed what "feels like heartburn" periodically - particularly at HD sessions.  He noticed it today and it lasted a long time so he decided to come in for evaluation.  He is noticing some SOB, including PND and periodic orthopnea.  No fever or cough.  Patient was admitted for chest pain, cardiology, infectious disease, nephrology also consulted.  Echocardiogram revealed rocking, dehiscence, stenosis and fracture/perforation the mitral prosthesis.  Cardiothoracic surgery was consulted again, unfortunately patient is not a candidate for redo valve replacement.  Multiple providers have recommended palliative care/hospice.  Patient has been discharged to SNF but stayed due to logistic issues.  Subjective: Seen and examined earlier this morning.  No major events overnight of this morning.  No complaints  either.  He denies chest pain, shortness of breath, dizziness or GI symptoms.  Objective: Vitals:   01/28/21 1630 01/28/21 1715 01/28/21 1832 01/29/21 0700  BP: 111/80 116/88 (!) 114/96 98/73  Pulse: 79 81  84  Resp:  '18 12 16  '$ Temp:   98.3 F (36.8 C) 98 F (36.7 C)  TempSrc:   Oral Oral  SpO2:  100%  94%  Weight:  64.5 kg    Height:        Intake/Output Summary (Last 24 hours) at 01/29/2021 1222 Last data filed at 01/28/2021 1715 Gross per 24 hour  Intake --  Output 2017 ml  Net -2017 ml   Filed Weights   01/28/21 0500 01/28/21 1345 01/28/21 1715  Weight: 66.2 kg 66.5 kg 64.5 kg    Examination:  GENERAL: No apparent distress.  Nontoxic. HEENT: MMM.  Vision and hearing grossly intact.  NECK: Supple.  No apparent JVD.  RESP:  No IWOB.  Fair aeration bilaterally. CVS:  RRR. Heart sounds normal.  ABD/GI/GU: BS+. Abd soft, NTND.  Left aVF with good bruits. MSK/EXT:  Moves extremities. No apparent deformity. No edema.  SKIN: no apparent skin lesion or wound NEURO: Awake, alert and oriented appropriately.  No apparent focal neuro deficit. PSYCH: Calm. Normal affect.   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 -Per CT surgery, patient not a candidate for redo surgery -Palliative care following for goals of care conversation -ID recommended continuing vancomycin with HD-we will reassess in 3 to 4 weeks   Demand ischemia Chest pain-resolved -TTE as above. -Appreciate cardiology input-likely demand ischemia in the setting of the above and metabolic  derangements  -Cardiology signed off 6/21 no further cardiac work-up planned -Continue aspirin, statin   Atrial tachycardia: Resolved. -Continue cardizem, lopressor   ESRD on HD MWF -Per nephrology.   Cirrhosis with hepatitis C -Outpatient follow-up, depending on goals of care conversation     Body mass index is 19.29 kg/m.         DVT prophylaxis:  heparin injection 5,000 Units Start: 01/23/21 1400  Code Status: Full code Family Communication: Patient and/or RN. Available if any question.  Level of care: Telemetry Medical Status is: Inpatient  Remains inpatient appropriate because:Unsafe d/c plan  Dispo: The patient is from: SNF              Anticipated d/c is to: SNF              Patient currently is medically stable to d/c.   Difficult to place patient No       Consultants:  Cardiology CVTS Nephrology Infectious disease Palliative medicine   Sch Meds:  Scheduled Meds:  aspirin  325 mg Oral Daily   atorvastatin  40 mg Oral Daily   Chlorhexidine Gluconate Cloth  6 each Topical Q0600   cinacalcet  60 mg Oral Q breakfast   diltiazem  120 mg Oral Daily   docusate sodium  100 mg Oral BID   heparin  5,000 Units Subcutaneous Q8H   methocarbamol  500 mg Oral TID   metoprolol tartrate  25 mg Oral BID   oxyCODONE  15 mg Oral Q12H   pantoprazole  40 mg Oral Daily   pregabalin  75 mg Oral QHS   senna-docusate  1 tablet Oral BID   sevelamer carbonate  1,600 mg Oral TID WC   sodium chloride flush  3 mL Intravenous Q12H   vancomycin variable dose per unstable renal function (pharmacist dosing)   Does not apply See admin instructions   Continuous Infusions:  vancomycin     PRN Meds:.acetaminophen **OR** acetaminophen, bisacodyl, calcium carbonate (dosed in mg elemental calcium), camphor-menthol **AND** hydrOXYzine, docusate sodium, feeding supplement (NEPRO CARB STEADY), ondansetron **OR** ondansetron (ZOFRAN) IV, polyethylene glycol, sorbitol,  zolpidem  Antimicrobials: Anti-infectives (From admission, onward)    Start     Dose/Rate Route Frequency Ordered Stop   01/28/21 1623  vancomycin (VANCOREADY) 750 MG/150ML IVPB       Note to Pharmacy: Gaynelle Adu  : cabinet override      01/28/21 1623 01/28/21 1628   01/26/21 2100  vancomycin (VANCOREADY) 750 MG/150ML IVPB       Note to Pharmacy: Cherylann Banas   : cabinet override      01/26/21 2100 01/26/21 2218   01/26/21 1330  vancomycin (VANCOCIN) IVPB 750 mg/150 ml premix        750 mg 150 mL/hr over 60 Minutes Intravenous Every M-W-F (Hemodialysis) 01/26/21 1238     01/24/21 1318  vancomycin variable dose per unstable renal function (pharmacist dosing)         Does not apply See admin instructions 01/24/21 1318     01/24/21 1200  vancomycin (VANCOREADY) IVPB 750 mg/150 mL  Status:  Discontinued  750 mg 150 mL/hr over 60 Minutes Intravenous Every M-W-F (Hemodialysis) 01/23/21 1134 01/24/21 0733   01/24/21 0830  vancomycin (VANCOREADY) IVPB 750 mg/150 mL        750 mg 150 mL/hr over 60 Minutes Intravenous STAT 01/24/21 0734 01/24/21 0950   01/23/21 2115  vancomycin (VANCOREADY) IVPB 750 mg/150 mL  Status:  Discontinued        750 mg 150 mL/hr over 60 Minutes Intravenous  Once 01/23/21 2103 01/24/21 0733   01/23/21 1500  rifampin (RIFADIN) capsule 300 mg  Status:  Discontinued       Note to Pharmacy: 6 weeks from 5/27     300 mg Oral Every 8 hours 01/23/21 1336 01/26/21 0929   01/23/21 1130  ceFEPIme (MAXIPIME) 2 g in sodium chloride 0.9 % 100 mL IVPB        2 g 200 mL/hr over 30 Minutes Intravenous  Once 01/23/21 1122 01/23/21 1305        I have personally reviewed the following labs and images: CBC: Recent Labs  Lab 01/23/21 0920 01/24/21 0416 01/25/21 0314 01/26/21 0245 01/27/21 0247 01/28/21 0342 01/29/21 0243  WBC 17.5*   < > 15.4* 14.6* 10.3 8.7 5.9  NEUTROABS 14.6*  --   --   --   --   --   --   HGB 9.4*   < > 9.5* 9.8* 10.3* 9.9* 10.1*  HCT  33.8*   < > 32.5* 33.0* 33.4* 33.0* 33.7*  MCV 91.6   < > 87.6 86.6 84.1 85.7 87.5  PLT 259   < > 205 173 158 139* 129*   < > = values in this interval not displayed.   BMP &GFR Recent Labs  Lab 01/25/21 0314 01/26/21 0245 01/27/21 0247 01/28/21 0342 01/29/21 0243  NA 130* 129* 132* 131* 133*  K 4.0 4.1 3.4* 3.8 3.6  CL 89* 88* 92* 90* 97*  CO2 23 21* '26 29 26  '$ GLUCOSE 89 75 78 106* 115*  BUN 32* 46* 22* 37* 19  CREATININE 6.05* 7.36* 4.73* 6.68* 4.35*  CALCIUM 7.8* 7.7* 7.8* 7.6* 7.7*   Estimated Creatinine Clearance: 18.7 mL/min (A) (by C-G formula based on SCr of 4.35 mg/dL (H)). Liver & Pancreas: Recent Labs  Lab 01/23/21 0920  AST 86*  ALT 32  ALKPHOS 82  BILITOT 3.9*  PROT 8.6*  ALBUMIN 2.3*   No results for input(s): LIPASE, AMYLASE in the last 168 hours. No results for input(s): AMMONIA in the last 168 hours. Diabetic: No results for input(s): HGBA1C in the last 72 hours. Recent Labs  Lab 01/28/21 1831  GLUCAP 109*   Cardiac Enzymes: No results for input(s): CKTOTAL, CKMB, CKMBINDEX, TROPONINI in the last 168 hours. No results for input(s): PROBNP in the last 8760 hours. Coagulation Profile: Recent Labs  Lab 01/23/21 0920  INR 1.6*   Thyroid Function Tests: No results for input(s): TSH, T4TOTAL, FREET4, T3FREE, THYROIDAB in the last 72 hours. Lipid Profile: No results for input(s): CHOL, HDL, LDLCALC, TRIG, CHOLHDL, LDLDIRECT in the last 72 hours. Anemia Panel: No results for input(s): VITAMINB12, FOLATE, FERRITIN, TIBC, IRON, RETICCTPCT in the last 72 hours. Urine analysis:    Component Value Date/Time   COLORURINE YELLOW 08/28/2013 1426   APPEARANCEUR HAZY (A) 08/28/2013 1426   LABSPEC 1.017 08/28/2013 1426   PHURINE 6.0 08/28/2013 1426   GLUCOSEU 100 (A) 08/28/2013 1426   HGBUR SMALL (A) 08/28/2013 1426   BILIRUBINUR NEGATIVE 08/28/2013 1426   KETONESUR NEGATIVE 08/28/2013  1426   PROTEINUR >300 (A) 08/28/2013 1426   UROBILINOGEN 0.2  08/28/2013 1426   NITRITE NEGATIVE 08/28/2013 1426   LEUKOCYTESUR SMALL (A) 08/28/2013 1426   Sepsis Labs: Invalid input(s): PROCALCITONIN, [place]  Microbiology: Recent Results (from the past 240 hour(s))  Blood Culture (routine x 2)     Status: Abnormal   Collection Time: 01/23/21  4:20 AM   Specimen: BLOOD  Result Value Ref Range Status   Specimen Description BLOOD SITE NOT SPECIFIED  Final   Special Requests   Final    BOTTLES DRAWN AEROBIC ONLY Blood Culture results may not be optimal due to an inadequate volume of blood received in culture bottles   Culture  Setup Time   Final    GRAM POSITIVE COCCI IN CLUSTERS AEROBIC BOTTLE ONLY CRITICAL VALUE NOTED.  VALUE IS CONSISTENT WITH PREVIOUSLY REPORTED AND CALLED VALUE.    Culture (A)  Final    STAPHYLOCOCCUS EPIDERMIDIS THE SIGNIFICANCE OF ISOLATING THIS ORGANISM FROM A SINGLE SET OF BLOOD CULTURES WHEN MULTIPLE SETS ARE DRAWN IS UNCERTAIN. PLEASE NOTIFY THE MICROBIOLOGY DEPARTMENT WITHIN ONE WEEK IF SPECIATION AND SENSITIVITIES ARE REQUIRED. Performed at [facility], [place] [address] [place], [place] [address]    Report Status 01/27/2021 FINAL  Final  Resp Panel by RT-PCR (Flu A&B, Covid) Nasopharyngeal Swab     Status: None   Collection Time: 01/23/21 11:19 AM   Specimen: Nasopharyngeal Swab; Nasopharyngeal(NP) swabs in vial transport medium  Result Value Ref Range Status   SARS Coronavirus 2 by RT PCR NEGATIVE NEGATIVE Final    Comment: (NOTE) SARS-CoV-2 target nucleic acids are NOT DETECTED.  The SARS-CoV-2 RNA is generally detectable in upper respiratory specimens during the acute phase of infection. The lowest concentration of SARS-CoV-2 viral copies this assay can detect is 138 copies/mL. A negative result does not preclude SARS-Cov-2 infection and should not be used as the sole basis for treatment or other patient management decisions. A negative result may occur with  improper specimen  collection/handling, submission of specimen other than nasopharyngeal swab, presence of viral mutation(s) within the areas targeted by this assay, and inadequate number of viral copies(<138 copies/mL). A negative result must be combined with clinical observations, patient history, and epidemiological information. The expected result is Negative.  Fact Sheet for Patients:  EntrepreneurPulse.com.au  Fact Sheet for Healthcare Providers:  IncredibleEmployment.be  This test is no t yet approved or cleared by the [place] FDA and  has been authorized for detection and/or diagnosis of SARS-CoV-2 by FDA under an Emergency Use Authorization (EUA). This EUA will remain  in effect (meaning this test can be used) for the duration of the COVID-19 declaration under Section 564(b)(1) of the Act, 21 U.S.C.section 360bbb-3(b)(1), unless the authorization is terminated  or revoked sooner.       Influenza A by PCR NEGATIVE NEGATIVE Final   Influenza B by PCR NEGATIVE NEGATIVE Final    Comment: (NOTE) The Xpert Xpress SARS-CoV-2/FLU/RSV plus assay is intended as an aid in the diagnosis of influenza from Nasopharyngeal swab specimens and should not be used as a sole basis for treatment. Nasal washings and aspirates are unacceptable for Xpert Xpress SARS-CoV-2/FLU/RSV testing.  Fact Sheet for Patients: EntrepreneurPulse.com.au  Fact Sheet for Healthcare Providers: IncredibleEmployment.be  This test is not yet approved or cleared by the [place] FDA and has been authorized for detection and/or diagnosis of SARS-CoV-2 by FDA under an Emergency Use Authorization (EUA). This EUA will remain in effect (meaning this  test can be used) for the duration of the COVID-19 declaration under Section 564(b)(1) of the Act, 21 U.S.C. section 360bbb-3(b)(1), unless the authorization is terminated or revoked.  Performed at [facility], [place] [address], [place], [place] [address]   SARS CORONAVIRUS 2 (TAT 6-24 HRS) Nasopharyngeal Nasopharyngeal Swab     Status: None   Collection Time: 01/27/21 11:06 AM   Specimen: Nasopharyngeal Swab  Result Value Ref Range Status   SARS Coronavirus 2 NEGATIVE NEGATIVE Final    Comment: (NOTE) SARS-CoV-2 target nucleic acids are NOT DETECTED.  The SARS-CoV-2 RNA is generally detectable in upper and lower respiratory specimens during the acute phase of infection. Negative results do not preclude SARS-CoV-2 infection, do not rule out co-infections with other pathogens, and should not be used as the sole basis for treatment or other patient management decisions. Negative results must be combined with clinical observations, patient history, and epidemiological information. The expected result is Negative.  Fact Sheet for Patients: SugarRoll.be  Fact Sheet for Healthcare Providers: https://www.woods-mathews.com/  This test is not yet approved or cleared by the [place] FDA and  has been authorized for detection and/or diagnosis of SARS-CoV-2 by FDA under an Emergency Use Authorization (EUA). This EUA will remain  in effect (meaning this test can be used) for the duration of the COVID-19 declaration under Se ction 564(b)(1) of the Act, 21 U.S.C. section 360bbb-3(b)(1), unless the authorization is terminated or revoked sooner.  Performed at [facility], [place] [address], [place], [place] [address]     Radiology Studies: No results found.    Shital Crayton T. [place]  If 7PM-7AM, please contact night-coverage [email or web address] 01/28/2021, 12:22 PM

## 2021-01-29 NOTE — Progress Notes (Signed)
PROGRESS NOTE  Patrick Anthony [id number] DOB: [birth year]   PCP: [name], MD  Patient is from: SNF  DOA: 01/23/2021 LOS: 6  Chief complaints:  Chief Complaint  Patient presents with   Chest Pain     Brief Narrative / Interim history: 50 y.o. 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  He was recommended to have 6 weeks of Vanc/Rifampin treatment (through 7/8) and completed 2 weeks of gentamicin treatment.  CT surgery determined that he is not a candidate for redo valve replacement.  He reports since d/c he has noticed what "feels like heartburn" periodically - particularly at HD sessions.  He noticed it today and it lasted a long time so he decided to come in for evaluation.  He is noticing some SOB, including PND and periodic orthopnea.  No fever or cough.  Patient was admitted for chest pain, cardiology, infectious disease, nephrology also consulted.  Echocardiogram revealed rocking, dehiscence, stenosis and fracture/perforation the mitral prosthesis.  Cardiothoracic surgery was consulted again, unfortunately patient is not a candidate for redo valve replacement.  Multiple providers have recommended palliative care/hospice.  Patient has been discharged to SNF but stayed due to logistic issues.  Subjective: Seen and examined earlier this morning.  No major events overnight of this morning.  No complaints.  He  had some bleeding from aVF after dialysis yesterday.  Bleeding was controlled with pressure dressing.  He was to be transferred to SNF but [facility] arrived late at midnight, and he stayed overnight.   Objective: Vitals:   01/28/21 1630 01/28/21 1715 01/28/21 1832 01/29/21 0700  BP: 111/80 116/88 (!) 114/96 98/73  Pulse: 79 81  84  Resp:  '18 12 16  '$ Temp:   98.3 F (36.8 C) 98 F (36.7 C)  TempSrc:   Oral Oral  SpO2:  100%  94%  Weight:  64.5 kg    Height:        Intake/Output Summary (Last 24 hours) at 01/29/2021 1335 Last data filed at 01/28/2021 1715 Gross per 24 hour  Intake --  Output 2017 ml  Net -2017 ml   Filed Weights   01/28/21 0500 01/28/21 1345 01/28/21 1715  Weight: 66.2 kg 66.5 kg 64.5 kg    Examination: GENERAL: No apparent distress.  Nontoxic. HEENT: MMM.  Vision and hearing grossly intact.  NECK: Supple.  No apparent JVD.  RESP:  No IWOB.  Fair aeration bilaterally. CVS:  RRR. Heart sounds normal.  ABD/GI/GU: BS+. Abd soft, NTND.  Left arm aVF with good bruits.  Pressure dressing in place. MSK/EXT:  Moves extremities.  Right AKA.  No edema.  SKIN: no apparent skin lesion or wound NEURO: Awake and alert. Oriented appropriately.  No apparent focal neuro deficit. PSYCH: Calm. Normal affect.   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 -Per CT surgery, patient not a candidate for redo surgery -Palliative care following for goals of care conversation -ID recommended continuing vancomycin with HD-we will reassess in 3 to 4 weeks   Chest  pain-resolved.  Likely demand ischemia in the setting of the above and metabolic derangements  -TTE as above. -Cardiology signed off 6/21 no further cardiac work-up planned -Continue aspirin, statin   Atrial tachycardia: Resolved. -Continue cardizem, lopressor   ESRD on HD MWF -Per nephrology.   Cirrhosis with hepatitis C -Outpatient follow-up, depending on goals of care conversation   Right AKA: Stable.  Essential hypertension: Normotensive. -Continue Coreg and Lopressor as above   Body mass index is 19.29 kg/m.         DVT prophylaxis:  heparin injection 5,000 Units Start: 01/23/21 1400  Code Status: Full code Family Communication: Patient and/or RN. Available if any question.  Level of care: Telemetry Medical Status is: Inpatient  Remains inpatient appropriate because:Unsafe d/c plan  Dispo: The patient is from: SNF              Anticipated d/c is to: SNF              Patient currently is medically stable to d/c.   Difficult to place patient No       Consultants:  Cardiology CVTS Nephrology Infectious disease Palliative medicine   Sch Meds:  Scheduled Meds:  aspirin  325 mg Oral Daily   atorvastatin  40 mg Oral Daily   Chlorhexidine Gluconate Cloth  6 each Topical Q0600   cinacalcet  60 mg Oral Q breakfast   diltiazem  120 mg Oral Daily   docusate sodium  100 mg Oral BID   heparin  5,000 Units Subcutaneous Q8H   methocarbamol  500 mg Oral TID   metoprolol tartrate  25 mg Oral BID   oxyCODONE  15 mg Oral Q12H   pantoprazole  40 mg Oral Daily   pregabalin  75 mg Oral QHS   senna-docusate  1 tablet Oral BID   sevelamer carbonate  1,600 mg Oral TID WC   sodium chloride flush  3 mL Intravenous Q12H   vancomycin variable dose per unstable renal function (pharmacist dosing)   Does not apply See admin instructions   Continuous Infusions:  vancomycin     PRN Meds:.acetaminophen **OR** acetaminophen, bisacodyl, calcium carbonate (dosed in mg  elemental calcium), camphor-menthol **AND** hydrOXYzine, docusate sodium, feeding supplement (NEPRO CARB STEADY), ondansetron **OR** ondansetron (ZOFRAN) IV, polyethylene glycol, sorbitol, zolpidem  Antimicrobials: Anti-infectives (From admission, onward)    Start     Dose/Rate Route Frequency Ordered Stop   01/28/21 1623  vancomycin (VANCOREADY) 750 MG/150ML IVPB       Note to Pharmacy: Gaynelle Adu  : cabinet override      01/28/21 1623 01/28/21 1628   01/26/21 2100  vancomycin (VANCOREADY) 750 MG/150ML IVPB       Note to Pharmacy: Cherylann Banas   : cabinet override      01/26/21 2100 01/26/21 2218   01/26/21 1330  vancomycin (VANCOCIN) IVPB 750 mg/150 ml premix        750 mg 150 mL/hr over 60 Minutes Intravenous Every M-W-F (Hemodialysis) 01/26/21 1238     01/24/21 1318  vancomycin variable dose per unstable renal function (pharmacist dosing)  Does not apply See admin instructions 01/24/21 1318     01/24/21 1200  vancomycin (VANCOREADY) IVPB 750 mg/150 mL  Status:  Discontinued        750 mg 150 mL/hr over 60 Minutes Intravenous Every M-W-F (Hemodialysis) 01/23/21 1134 01/24/21 0733   01/24/21 0830  vancomycin (VANCOREADY) IVPB 750 mg/150 mL        750 mg 150 mL/hr over 60 Minutes Intravenous STAT 01/24/21 0734 01/24/21 0950   01/23/21 2115  vancomycin (VANCOREADY) IVPB 750 mg/150 mL  Status:  Discontinued        750 mg 150 mL/hr over 60 Minutes Intravenous  Once 01/23/21 2103 01/24/21 0733   01/23/21 1500  rifampin (RIFADIN) capsule 300 mg  Status:  Discontinued       Note to Pharmacy: 6 weeks from 5/27     300 mg Oral Every 8 hours 01/23/21 1336 01/26/21 0929   01/23/21 1130  ceFEPIme (MAXIPIME) 2 g in sodium chloride 0.9 % 100 mL IVPB        2 g 200 mL/hr over 30 Minutes Intravenous  Once 01/23/21 1122 01/23/21 1305        I have personally reviewed the following labs and images: CBC: Recent Labs  Lab 01/23/21 0920 01/24/21 0416 01/25/21 0314  01/26/21 0245 01/27/21 0247 01/28/21 0342 01/29/21 0243  WBC 17.5*   < > 15.4* 14.6* 10.3 8.7 5.9  NEUTROABS 14.6*  --   --   --   --   --   --   HGB 9.4*   < > 9.5* 9.8* 10.3* 9.9* 10.1*  HCT 33.8*   < > 32.5* 33.0* 33.4* 33.0* 33.7*  MCV 91.6   < > 87.6 86.6 84.1 85.7 87.5  PLT 259   < > 205 173 158 139* 129*   < > = values in this interval not displayed.   BMP &GFR Recent Labs  Lab 01/25/21 0314 01/26/21 0245 01/27/21 0247 01/28/21 0342 01/29/21 0243  NA 130* 129* 132* 131* 133*  K 4.0 4.1 3.4* 3.8 3.6  CL 89* 88* 92* 90* 97*  CO2 23 21* '26 29 26  '$ GLUCOSE 89 75 78 106* 115*  BUN 32* 46* 22* 37* 19  CREATININE 6.05* 7.36* 4.73* 6.68* 4.35*  CALCIUM 7.8* 7.7* 7.8* 7.6* 7.7*   Estimated Creatinine Clearance: 18.7 mL/min (A) (by C-G formula based on SCr of 4.35 mg/dL (H)). Liver & Pancreas: Recent Labs  Lab 01/23/21 0920  AST 86*  ALT 32  ALKPHOS 82  BILITOT 3.9*  PROT 8.6*  ALBUMIN 2.3*   No results for input(s): LIPASE, AMYLASE in the last 168 hours. No results for input(s): AMMONIA in the last 168 hours. Diabetic: No results for input(s): HGBA1C in the last 72 hours. Recent Labs  Lab 01/28/21 1831  GLUCAP 109*   Cardiac Enzymes: No results for input(s): CKTOTAL, CKMB, CKMBINDEX, TROPONINI in the last 168 hours. No results for input(s): PROBNP in the last 8760 hours. Coagulation Profile: Recent Labs  Lab 01/23/21 0920  INR 1.6*   Thyroid Function Tests: No results for input(s): TSH, T4TOTAL, FREET4, T3FREE, THYROIDAB in the last 72 hours. Lipid Profile: No results for input(s): CHOL, HDL, LDLCALC, TRIG, CHOLHDL, LDLDIRECT in the last 72 hours. Anemia Panel: No results for input(s): VITAMINB12, FOLATE, FERRITIN, TIBC, IRON, RETICCTPCT in the last 72 hours. Urine analysis:    Component Value Date/Time   COLORURINE YELLOW 08/28/2013 1426   APPEARANCEUR HAZY (A) 08/28/2013 1426   LABSPEC 1.017 08/28/2013  1426   PHURINE 6.0 08/28/2013 1426    GLUCOSEU 100 (A) 08/28/2013 1426   HGBUR SMALL (A) 08/28/2013 1426   BILIRUBINUR NEGATIVE 08/28/2013 1426   KETONESUR NEGATIVE 08/28/2013 1426   PROTEINUR >300 (A) 08/28/2013 1426   UROBILINOGEN 0.2 08/28/2013 1426   NITRITE NEGATIVE 08/28/2013 1426   LEUKOCYTESUR SMALL (A) 08/28/2013 1426   Sepsis Labs: Invalid input(s): PROCALCITONIN, [place]  Microbiology: Recent Results (from the past 240 hour(s))  Blood Culture (routine x 2)     Status: Abnormal   Collection Time: 01/23/21  4:20 AM   Specimen: BLOOD  Result Value Ref Range Status   Specimen Description BLOOD SITE NOT SPECIFIED  Final   Special Requests   Final    BOTTLES DRAWN AEROBIC ONLY Blood Culture results may not be optimal due to an inadequate volume of blood received in culture bottles   Culture  Setup Time   Final    GRAM POSITIVE COCCI IN CLUSTERS AEROBIC BOTTLE ONLY CRITICAL VALUE NOTED.  VALUE IS CONSISTENT WITH PREVIOUSLY REPORTED AND CALLED VALUE.    Culture (A)  Final    STAPHYLOCOCCUS EPIDERMIDIS THE SIGNIFICANCE OF ISOLATING THIS ORGANISM FROM A SINGLE SET OF BLOOD CULTURES WHEN MULTIPLE SETS ARE DRAWN IS UNCERTAIN. PLEASE NOTIFY THE MICROBIOLOGY DEPARTMENT WITHIN ONE WEEK IF SPECIATION AND SENSITIVITIES ARE REQUIRED. Performed at [facility], [place] [address] [place], [place] [address]    Report Status 01/27/2021 FINAL  Final  Resp Panel by RT-PCR (Flu A&B, Covid) Nasopharyngeal Swab     Status: None   Collection Time: 01/23/21 11:19 AM   Specimen: Nasopharyngeal Swab; Nasopharyngeal(NP) swabs in vial transport medium  Result Value Ref Range Status   SARS Coronavirus 2 by RT PCR NEGATIVE NEGATIVE Final    Comment: (NOTE) SARS-CoV-2 target nucleic acids are NOT DETECTED.  The SARS-CoV-2 RNA is generally detectable in upper respiratory specimens during the acute phase of infection. The lowest concentration of SARS-CoV-2 viral copies this assay can detect is 138 copies/mL. A negative result  does not preclude SARS-Cov-2 infection and should not be used as the sole basis for treatment or other patient management decisions. A negative result may occur with  improper specimen collection/handling, submission of specimen other than nasopharyngeal swab, presence of viral mutation(s) within the areas targeted by this assay, and inadequate number of viral copies(<138 copies/mL). A negative result must be combined with clinical observations, patient history, and epidemiological information. The expected result is Negative.  Fact Sheet for Patients:  EntrepreneurPulse.com.au  Fact Sheet for Healthcare Providers:  IncredibleEmployment.be  This test is no t yet approved or cleared by the [place] FDA and  has been authorized for detection and/or diagnosis of SARS-CoV-2 by FDA under an Emergency Use Authorization (EUA). This EUA will remain  in effect (meaning this test can be used) for the duration of the COVID-19 declaration under Section 564(b)(1) of the Act, 21 U.S.C.section 360bbb-3(b)(1), unless the authorization is terminated  or revoked sooner.       Influenza A by PCR NEGATIVE NEGATIVE Final   Influenza B by PCR NEGATIVE NEGATIVE Final    Comment: (NOTE) The Xpert Xpress SARS-CoV-2/FLU/RSV plus assay is intended as an aid in the diagnosis of influenza from Nasopharyngeal swab specimens and should not be used as a sole basis for treatment. Nasal washings and aspirates are unacceptable for Xpert Xpress SARS-CoV-2/FLU/RSV testing.  Fact Sheet for Patients: EntrepreneurPulse.com.au  Fact Sheet for Healthcare Providers: IncredibleEmployment.be  This test is not yet approved or  cleared by the [place] and has been authorized for detection and/or diagnosis of SARS-CoV-2 by FDA under an Emergency Use Authorization (EUA). This EUA will remain in effect (meaning this test can be used) for  the duration of the COVID-19 declaration under Section 564(b)(1) of the Act, 21 U.S.C. section 360bbb-3(b)(1), unless the authorization is terminated or revoked.  Performed at [facility], [place] [address] [place], [place] [address]   SARS CORONAVIRUS 2 (TAT 6-24 HRS) Nasopharyngeal Nasopharyngeal Swab     Status: None   Collection Time: 01/27/21 11:06 AM   Specimen: Nasopharyngeal Swab  Result Value Ref Range Status   SARS Coronavirus 2 NEGATIVE NEGATIVE Final    Comment: (NOTE) SARS-CoV-2 target nucleic acids are NOT DETECTED.  The SARS-CoV-2 RNA is generally detectable in upper and lower respiratory specimens during the acute phase of infection. Negative results do not preclude SARS-CoV-2 infection, do not rule out co-infections with other pathogens, and should not be used as the sole basis for treatment or other patient management decisions. Negative results must be combined with clinical observations, patient history, and epidemiological information. The expected result is Negative.  Fact Sheet for Patients: SugarRoll.be  Fact Sheet for Healthcare Providers: https://www.woods-mathews.com/  This test is not yet approved or cleared by the [place] FDA and  has been authorized for detection and/or diagnosis of SARS-CoV-2 by FDA under an Emergency Use Authorization (EUA). This EUA will remain  in effect (meaning this test can be used) for the duration of the COVID-19 declaration under Se ction 564(b)(1) of the Act, 21 U.S.C. section 360bbb-3(b)(1), unless the authorization is terminated or revoked sooner.  Performed at [facility], [place] [address] [place], [place] [address]     Radiology Studies: No results found.    Shavar Gorka T. [place]  If 7PM-7AM, please contact night-coverage [email or web address] 01/28/2021, 1:35 PM

## 2021-01-29 NOTE — Progress Notes (Signed)
PTAR arrived, but facility states they will not accept patient at this time. PTAR pick-up will be rescheduled.

## 2021-01-29 NOTE — Progress Notes (Signed)
First attempt to call [place] [facility] to give report , RN left message to call back

## 2021-01-29 NOTE — Progress Notes (Signed)
Second attempt to call report.

## 2021-01-31 ENCOUNTER — Telehealth: Payer: Self-pay | Admitting: *Deleted

## 2021-01-31 DIAGNOSIS — E875 Hyperkalemia: Secondary | ICD-10-CM | POA: Diagnosis not present

## 2021-01-31 DIAGNOSIS — Z992 Dependence on renal dialysis: Secondary | ICD-10-CM | POA: Diagnosis not present

## 2021-01-31 DIAGNOSIS — D631 Anemia in chronic kidney disease: Secondary | ICD-10-CM | POA: Diagnosis not present

## 2021-01-31 DIAGNOSIS — D689 Coagulation defect, unspecified: Secondary | ICD-10-CM | POA: Diagnosis not present

## 2021-01-31 DIAGNOSIS — N186 End stage renal disease: Secondary | ICD-10-CM | POA: Diagnosis not present

## 2021-01-31 DIAGNOSIS — R7881 Bacteremia: Secondary | ICD-10-CM | POA: Diagnosis not present

## 2021-01-31 DIAGNOSIS — N2581 Secondary hyperparathyroidism of renal origin: Secondary | ICD-10-CM | POA: Diagnosis not present

## 2021-01-31 NOTE — Telephone Encounter (Signed)
Transition Care Management Follow-up Telephone Call Date of discharge and from where: 01/29/2021 - [facility] How have you been since you were released from the hospital? "I am alright" Any questions or concerns? No  Items Reviewed: Did the pt receive and understand the discharge instructions provided? Yes  Medications obtained and verified? Yes  Other? No  Any new allergies since your discharge? No  Dietary orders reviewed? No Do you have support at home? Yes    Functional Questionnaire: (I = Independent and D = Dependent) ADLs: I  Bathing/Dressing- I  Meal Prep- I  Eating- I  Maintaining continence- I  Transferring/Ambulation- I  Managing Meds- I  Follow up appointments reviewed:  PCP Hospital f/u appt confirmed? No   Specialist Hospital f/u appt confirmed? Yes  Scheduled to see Infectious Disease on 03/02/2021 @ 1600. Are transportation arrangements needed?  Not right now - number was given just in case transportation is needed If their condition worsens, is the pt aware to call PCP or go to the Emergency Dept.? Yes Was the patient provided with contact information for the PCP's office or ED? Yes Was to pt encouraged to call back with questions or concerns? Yes

## 2021-02-02 DIAGNOSIS — Z992 Dependence on renal dialysis: Secondary | ICD-10-CM | POA: Diagnosis not present

## 2021-02-02 DIAGNOSIS — D689 Coagulation defect, unspecified: Secondary | ICD-10-CM | POA: Diagnosis not present

## 2021-02-02 DIAGNOSIS — E875 Hyperkalemia: Secondary | ICD-10-CM | POA: Diagnosis not present

## 2021-02-02 DIAGNOSIS — D631 Anemia in chronic kidney disease: Secondary | ICD-10-CM | POA: Diagnosis not present

## 2021-02-02 DIAGNOSIS — R7881 Bacteremia: Secondary | ICD-10-CM | POA: Diagnosis not present

## 2021-02-02 DIAGNOSIS — N2581 Secondary hyperparathyroidism of renal origin: Secondary | ICD-10-CM | POA: Diagnosis not present

## 2021-02-02 DIAGNOSIS — N186 End stage renal disease: Secondary | ICD-10-CM | POA: Diagnosis not present

## 2021-02-03 DIAGNOSIS — N186 End stage renal disease: Secondary | ICD-10-CM | POA: Diagnosis not present

## 2021-02-03 DIAGNOSIS — I129 Hypertensive chronic kidney disease with stage 1 through stage 4 chronic kidney disease, or unspecified chronic kidney disease: Secondary | ICD-10-CM | POA: Diagnosis not present

## 2021-02-03 DIAGNOSIS — Z992 Dependence on renal dialysis: Secondary | ICD-10-CM | POA: Diagnosis not present

## 2021-02-04 DIAGNOSIS — Z992 Dependence on renal dialysis: Secondary | ICD-10-CM | POA: Diagnosis not present

## 2021-02-04 DIAGNOSIS — N2581 Secondary hyperparathyroidism of renal origin: Secondary | ICD-10-CM | POA: Diagnosis not present

## 2021-02-04 DIAGNOSIS — I129 Hypertensive chronic kidney disease with stage 1 through stage 4 chronic kidney disease, or unspecified chronic kidney disease: Secondary | ICD-10-CM | POA: Diagnosis not present

## 2021-02-04 DIAGNOSIS — N186 End stage renal disease: Secondary | ICD-10-CM | POA: Diagnosis not present

## 2021-02-04 DIAGNOSIS — R7881 Bacteremia: Secondary | ICD-10-CM | POA: Diagnosis not present

## 2021-02-07 DIAGNOSIS — N2581 Secondary hyperparathyroidism of renal origin: Secondary | ICD-10-CM | POA: Diagnosis not present

## 2021-02-07 DIAGNOSIS — Z992 Dependence on renal dialysis: Secondary | ICD-10-CM | POA: Diagnosis not present

## 2021-02-07 DIAGNOSIS — R7881 Bacteremia: Secondary | ICD-10-CM | POA: Diagnosis not present

## 2021-02-07 DIAGNOSIS — N186 End stage renal disease: Secondary | ICD-10-CM | POA: Diagnosis not present

## 2021-02-09 DIAGNOSIS — R7881 Bacteremia: Secondary | ICD-10-CM | POA: Diagnosis not present

## 2021-02-09 DIAGNOSIS — N2581 Secondary hyperparathyroidism of renal origin: Secondary | ICD-10-CM | POA: Diagnosis not present

## 2021-02-09 DIAGNOSIS — N186 End stage renal disease: Secondary | ICD-10-CM | POA: Diagnosis not present

## 2021-02-09 DIAGNOSIS — Z992 Dependence on renal dialysis: Secondary | ICD-10-CM | POA: Diagnosis not present

## 2021-02-10 ENCOUNTER — Ambulatory Visit: Payer: Medicaid Other | Admitting: Orthopedic Surgery

## 2021-02-10 DIAGNOSIS — I76 Septic arterial embolism: Secondary | ICD-10-CM | POA: Diagnosis not present

## 2021-02-10 DIAGNOSIS — I330 Acute and subacute infective endocarditis: Secondary | ICD-10-CM | POA: Diagnosis not present

## 2021-02-10 DIAGNOSIS — N186 End stage renal disease: Secondary | ICD-10-CM | POA: Diagnosis not present

## 2021-02-10 DIAGNOSIS — B9562 Methicillin resistant Staphylococcus aureus infection as the cause of diseases classified elsewhere: Secondary | ICD-10-CM | POA: Diagnosis not present

## 2021-02-11 DIAGNOSIS — R7881 Bacteremia: Secondary | ICD-10-CM | POA: Diagnosis not present

## 2021-02-11 DIAGNOSIS — N186 End stage renal disease: Secondary | ICD-10-CM | POA: Diagnosis not present

## 2021-02-11 DIAGNOSIS — N2581 Secondary hyperparathyroidism of renal origin: Secondary | ICD-10-CM | POA: Diagnosis not present

## 2021-02-11 DIAGNOSIS — Z992 Dependence on renal dialysis: Secondary | ICD-10-CM | POA: Diagnosis not present

## 2021-02-13 ENCOUNTER — Inpatient Hospital Stay (HOSPITAL_COMMUNITY): Payer: Medicaid Other

## 2021-02-13 ENCOUNTER — Emergency Department (HOSPITAL_COMMUNITY): Payer: Medicaid Other

## 2021-02-13 ENCOUNTER — Inpatient Hospital Stay (HOSPITAL_COMMUNITY)
Admission: EM | Admit: 2021-02-13 | Discharge: 2021-03-07 | DRG: 871 | Disposition: E | Payer: Medicaid Other | Attending: Emergency Medicine | Admitting: Emergency Medicine

## 2021-02-13 DIAGNOSIS — R000 Tachycardia, unspecified: Secondary | ICD-10-CM | POA: Diagnosis not present

## 2021-02-13 DIAGNOSIS — R918 Other nonspecific abnormal finding of lung field: Secondary | ICD-10-CM | POA: Diagnosis not present

## 2021-02-13 DIAGNOSIS — U071 COVID-19: Secondary | ICD-10-CM | POA: Diagnosis not present

## 2021-02-13 DIAGNOSIS — Z781 Physical restraint status: Secondary | ICD-10-CM

## 2021-02-13 DIAGNOSIS — E871 Hypo-osmolality and hyponatremia: Secondary | ICD-10-CM | POA: Diagnosis present

## 2021-02-13 DIAGNOSIS — K7291 Hepatic failure, unspecified with coma: Secondary | ICD-10-CM | POA: Diagnosis not present

## 2021-02-13 DIAGNOSIS — I330 Acute and subacute infective endocarditis: Secondary | ICD-10-CM | POA: Diagnosis present

## 2021-02-13 DIAGNOSIS — R579 Shock, unspecified: Secondary | ICD-10-CM | POA: Diagnosis not present

## 2021-02-13 DIAGNOSIS — E872 Acidosis: Secondary | ICD-10-CM | POA: Diagnosis present

## 2021-02-13 DIAGNOSIS — I10 Essential (primary) hypertension: Secondary | ICD-10-CM | POA: Diagnosis not present

## 2021-02-13 DIAGNOSIS — R197 Diarrhea, unspecified: Secondary | ICD-10-CM | POA: Diagnosis not present

## 2021-02-13 DIAGNOSIS — E875 Hyperkalemia: Secondary | ICD-10-CM | POA: Diagnosis not present

## 2021-02-13 DIAGNOSIS — Z87891 Personal history of nicotine dependence: Secondary | ICD-10-CM

## 2021-02-13 DIAGNOSIS — K7200 Acute and subacute hepatic failure without coma: Secondary | ICD-10-CM | POA: Diagnosis not present

## 2021-02-13 DIAGNOSIS — E876 Hypokalemia: Secondary | ICD-10-CM | POA: Diagnosis not present

## 2021-02-13 DIAGNOSIS — Z4659 Encounter for fitting and adjustment of other gastrointestinal appliance and device: Secondary | ICD-10-CM

## 2021-02-13 DIAGNOSIS — K7290 Hepatic failure, unspecified without coma: Secondary | ICD-10-CM

## 2021-02-13 DIAGNOSIS — R52 Pain, unspecified: Secondary | ICD-10-CM | POA: Diagnosis not present

## 2021-02-13 DIAGNOSIS — N2581 Secondary hyperparathyroidism of renal origin: Secondary | ICD-10-CM | POA: Diagnosis present

## 2021-02-13 DIAGNOSIS — Z9911 Dependence on respirator [ventilator] status: Secondary | ICD-10-CM

## 2021-02-13 DIAGNOSIS — A419 Sepsis, unspecified organism: Principal | ICD-10-CM | POA: Diagnosis present

## 2021-02-13 DIAGNOSIS — Z809 Family history of malignant neoplasm, unspecified: Secondary | ICD-10-CM

## 2021-02-13 DIAGNOSIS — I5042 Chronic combined systolic (congestive) and diastolic (congestive) heart failure: Secondary | ICD-10-CM | POA: Diagnosis present

## 2021-02-13 DIAGNOSIS — G9341 Metabolic encephalopathy: Secondary | ICD-10-CM | POA: Diagnosis present

## 2021-02-13 DIAGNOSIS — J1282 Pneumonia due to coronavirus disease 2019: Secondary | ICD-10-CM | POA: Diagnosis present

## 2021-02-13 DIAGNOSIS — I132 Hypertensive heart and chronic kidney disease with heart failure and with stage 5 chronic kidney disease, or end stage renal disease: Secondary | ICD-10-CM | POA: Diagnosis not present

## 2021-02-13 DIAGNOSIS — J9601 Acute respiratory failure with hypoxia: Secondary | ICD-10-CM | POA: Diagnosis present

## 2021-02-13 DIAGNOSIS — Z452 Encounter for adjustment and management of vascular access device: Secondary | ICD-10-CM | POA: Diagnosis not present

## 2021-02-13 DIAGNOSIS — Z792 Long term (current) use of antibiotics: Secondary | ICD-10-CM

## 2021-02-13 DIAGNOSIS — R4189 Other symptoms and signs involving cognitive functions and awareness: Secondary | ICD-10-CM | POA: Diagnosis not present

## 2021-02-13 DIAGNOSIS — J90 Pleural effusion, not elsewhere classified: Secondary | ICD-10-CM | POA: Diagnosis not present

## 2021-02-13 DIAGNOSIS — I480 Paroxysmal atrial fibrillation: Secondary | ICD-10-CM | POA: Diagnosis present

## 2021-02-13 DIAGNOSIS — E440 Moderate protein-calorie malnutrition: Secondary | ICD-10-CM | POA: Diagnosis present

## 2021-02-13 DIAGNOSIS — R404 Transient alteration of awareness: Secondary | ICD-10-CM | POA: Diagnosis not present

## 2021-02-13 DIAGNOSIS — I440 Atrioventricular block, first degree: Secondary | ICD-10-CM | POA: Diagnosis not present

## 2021-02-13 DIAGNOSIS — Y832 Surgical operation with anastomosis, bypass or graft as the cause of abnormal reaction of the patient, or of later complication, without mention of misadventure at the time of the procedure: Secondary | ICD-10-CM | POA: Diagnosis present

## 2021-02-13 DIAGNOSIS — J9811 Atelectasis: Secondary | ICD-10-CM | POA: Diagnosis not present

## 2021-02-13 DIAGNOSIS — E161 Other hypoglycemia: Secondary | ICD-10-CM | POA: Diagnosis not present

## 2021-02-13 DIAGNOSIS — Z992 Dependence on renal dialysis: Secondary | ICD-10-CM | POA: Diagnosis not present

## 2021-02-13 DIAGNOSIS — N186 End stage renal disease: Secondary | ICD-10-CM

## 2021-02-13 DIAGNOSIS — Z89611 Acquired absence of right leg above knee: Secondary | ICD-10-CM | POA: Diagnosis not present

## 2021-02-13 DIAGNOSIS — R0902 Hypoxemia: Secondary | ICD-10-CM

## 2021-02-13 DIAGNOSIS — E11649 Type 2 diabetes mellitus with hypoglycemia without coma: Secondary | ICD-10-CM | POA: Diagnosis not present

## 2021-02-13 DIAGNOSIS — R739 Hyperglycemia, unspecified: Secondary | ICD-10-CM | POA: Diagnosis not present

## 2021-02-13 DIAGNOSIS — Z6821 Body mass index (BMI) 21.0-21.9, adult: Secondary | ICD-10-CM

## 2021-02-13 DIAGNOSIS — D631 Anemia in chronic kidney disease: Secondary | ICD-10-CM | POA: Diagnosis present

## 2021-02-13 DIAGNOSIS — Z953 Presence of xenogenic heart valve: Secondary | ICD-10-CM

## 2021-02-13 DIAGNOSIS — T8209XA Other mechanical complication of heart valve prosthesis, initial encounter: Secondary | ICD-10-CM | POA: Diagnosis present

## 2021-02-13 DIAGNOSIS — R6521 Severe sepsis with septic shock: Secondary | ICD-10-CM | POA: Diagnosis present

## 2021-02-13 DIAGNOSIS — G4733 Obstructive sleep apnea (adult) (pediatric): Secondary | ICD-10-CM | POA: Diagnosis present

## 2021-02-13 DIAGNOSIS — J189 Pneumonia, unspecified organism: Secondary | ICD-10-CM | POA: Diagnosis not present

## 2021-02-13 DIAGNOSIS — E7220 Disorder of urea cycle metabolism, unspecified: Secondary | ICD-10-CM | POA: Diagnosis present

## 2021-02-13 DIAGNOSIS — M47814 Spondylosis without myelopathy or radiculopathy, thoracic region: Secondary | ICD-10-CM | POA: Diagnosis not present

## 2021-02-13 DIAGNOSIS — Z79891 Long term (current) use of opiate analgesic: Secondary | ICD-10-CM

## 2021-02-13 DIAGNOSIS — G9340 Encephalopathy, unspecified: Secondary | ICD-10-CM | POA: Diagnosis not present

## 2021-02-13 DIAGNOSIS — I517 Cardiomegaly: Secondary | ICD-10-CM | POA: Diagnosis not present

## 2021-02-13 DIAGNOSIS — I509 Heart failure, unspecified: Secondary | ICD-10-CM | POA: Diagnosis not present

## 2021-02-13 DIAGNOSIS — K7460 Unspecified cirrhosis of liver: Secondary | ICD-10-CM | POA: Diagnosis present

## 2021-02-13 DIAGNOSIS — E162 Hypoglycemia, unspecified: Secondary | ICD-10-CM | POA: Diagnosis not present

## 2021-02-13 DIAGNOSIS — Z8249 Family history of ischemic heart disease and other diseases of the circulatory system: Secondary | ICD-10-CM

## 2021-02-13 DIAGNOSIS — D6959 Other secondary thrombocytopenia: Secondary | ICD-10-CM | POA: Diagnosis present

## 2021-02-13 DIAGNOSIS — R4020 Unspecified coma: Secondary | ICD-10-CM | POA: Diagnosis not present

## 2021-02-13 DIAGNOSIS — Z4682 Encounter for fitting and adjustment of non-vascular catheter: Secondary | ICD-10-CM | POA: Diagnosis not present

## 2021-02-13 DIAGNOSIS — Z7982 Long term (current) use of aspirin: Secondary | ICD-10-CM

## 2021-02-13 DIAGNOSIS — Z538 Procedure and treatment not carried out for other reasons: Secondary | ICD-10-CM | POA: Diagnosis present

## 2021-02-13 DIAGNOSIS — Z515 Encounter for palliative care: Secondary | ICD-10-CM | POA: Diagnosis not present

## 2021-02-13 DIAGNOSIS — N2889 Other specified disorders of kidney and ureter: Secondary | ICD-10-CM | POA: Diagnosis not present

## 2021-02-13 DIAGNOSIS — Z66 Do not resuscitate: Secondary | ICD-10-CM | POA: Diagnosis not present

## 2021-02-13 DIAGNOSIS — Z79899 Other long term (current) drug therapy: Secondary | ICD-10-CM

## 2021-02-13 LAB — CBC WITH DIFFERENTIAL/PLATELET
Abs Immature Granulocytes: 0.06 10*3/uL (ref 0.00–0.07)
Basophils Absolute: 0 10*3/uL (ref 0.0–0.1)
Basophils Relative: 1 %
Eosinophils Absolute: 0 10*3/uL (ref 0.0–0.5)
Eosinophils Relative: 0 %
HCT: 36.8 % — ABNORMAL LOW (ref 39.0–52.0)
Hemoglobin: 10.3 g/dL — ABNORMAL LOW (ref 13.0–17.0)
Immature Granulocytes: 1 %
Lymphocytes Relative: 17 %
Lymphs Abs: 0.8 10*3/uL (ref 0.7–4.0)
MCH: 26.8 pg (ref 26.0–34.0)
MCHC: 28 g/dL — ABNORMAL LOW (ref 30.0–36.0)
MCV: 95.8 fL (ref 80.0–100.0)
Monocytes Absolute: 0.6 10*3/uL (ref 0.1–1.0)
Monocytes Relative: 12 %
Neutro Abs: 3.2 10*3/uL (ref 1.7–7.7)
Neutrophils Relative %: 69 %
Platelets: 54 10*3/uL — ABNORMAL LOW (ref 150–400)
RBC: 3.84 MIL/uL — ABNORMAL LOW (ref 4.22–5.81)
RDW: 23.9 % — ABNORMAL HIGH (ref 11.5–15.5)
WBC: 4.6 10*3/uL (ref 4.0–10.5)
nRBC: 0.6 % — ABNORMAL HIGH (ref 0.0–0.2)

## 2021-02-13 LAB — BLOOD GAS, ARTERIAL
Acid-Base Excess: 5.8 mmol/L — ABNORMAL HIGH (ref 0.0–2.0)
Acid-base deficit: 19.9 mmol/L — ABNORMAL HIGH (ref 0.0–2.0)
Bicarbonate: 6.9 mmol/L — ABNORMAL LOW (ref 20.0–28.0)
FIO2: 40
O2 Saturation: 95.1 %
Patient temperature: 33.9
pCO2 arterial: <19 mmHg — CL (ref 32.0–48.0)
pH, Arterial: 7.236 — ABNORMAL LOW (ref 7.350–7.450)
pO2, Arterial: 99.6 mmHg (ref 83.0–108.0)

## 2021-02-13 LAB — I-STAT CHEM 8, ED
BUN: 46 mg/dL — ABNORMAL HIGH (ref 6–20)
Calcium, Ion: 0.81 mmol/L — CL (ref 1.15–1.40)
Chloride: 98 mmol/L (ref 98–111)
Creatinine, Ser: 7 mg/dL — ABNORMAL HIGH (ref 0.61–1.24)
Glucose, Bld: 74 mg/dL (ref 70–99)
HCT: 40 % (ref 39.0–52.0)
Hemoglobin: 13.6 g/dL (ref 13.0–17.0)
Potassium: 5.9 mmol/L — ABNORMAL HIGH (ref 3.5–5.1)
Sodium: 131 mmol/L — ABNORMAL LOW (ref 135–145)
TCO2: 19 mmol/L — ABNORMAL LOW (ref 22–32)

## 2021-02-13 LAB — COMPREHENSIVE METABOLIC PANEL
ALT: 25 U/L (ref 0–44)
AST: 88 U/L — ABNORMAL HIGH (ref 15–41)
Albumin: 2.2 g/dL — ABNORMAL LOW (ref 3.5–5.0)
Alkaline Phosphatase: 86 U/L (ref 38–126)
Anion gap: 26 — ABNORMAL HIGH (ref 5–15)
BUN: 44 mg/dL — ABNORMAL HIGH (ref 6–20)
CO2: 15 mmol/L — ABNORMAL LOW (ref 22–32)
Calcium: 7.3 mg/dL — ABNORMAL LOW (ref 8.9–10.3)
Chloride: 93 mmol/L — ABNORMAL LOW (ref 98–111)
Creatinine, Ser: 6.55 mg/dL — ABNORMAL HIGH (ref 0.61–1.24)
GFR, Estimated: 10 mL/min — ABNORMAL LOW (ref >60)
Glucose, Bld: 60 mg/dL — ABNORMAL LOW (ref 70–99)
Potassium: 6 mmol/L — ABNORMAL HIGH (ref 3.5–5.1)
Sodium: 134 mmol/L — ABNORMAL LOW (ref 135–145)
Total Bilirubin: 3.5 mg/dL — ABNORMAL HIGH (ref 0.3–1.2)
Total Protein: 7.9 g/dL (ref 6.5–8.1)

## 2021-02-13 LAB — CBG MONITORING, ED
Glucose-Capillary: 78 mg/dL (ref 70–99)
Glucose-Capillary: 28 mg/dL — CL (ref 70–99)
Glucose-Capillary: 115 mg/dL — ABNORMAL HIGH (ref 70–99)
Glucose-Capillary: 83 mg/dL (ref 70–99)
Glucose-Capillary: 88 mg/dL (ref 70–99)
Glucose-Capillary: 78 mg/dL (ref 70–99)

## 2021-02-13 LAB — RENAL FUNCTION PANEL
Albumin: 1.9 g/dL — ABNORMAL LOW (ref 3.5–5.0)
Anion gap: 32 — ABNORMAL HIGH (ref 5–15)
BUN: 42 mg/dL — ABNORMAL HIGH (ref 6–20)
CO2: 10 mmol/L — ABNORMAL LOW (ref 22–32)
Calcium: 7.7 mg/dL — ABNORMAL LOW (ref 8.9–10.3)
Chloride: 94 mmol/L — ABNORMAL LOW (ref 98–111)
Creatinine, Ser: 6.09 mg/dL — ABNORMAL HIGH (ref 0.61–1.24)
GFR, Estimated: 11 mL/min — ABNORMAL LOW (ref >60)
Glucose, Bld: 126 mg/dL — ABNORMAL HIGH (ref 70–99)
Phosphorus: 6.4 mg/dL — ABNORMAL HIGH (ref 2.5–4.6)
Potassium: 6.1 mmol/L — ABNORMAL HIGH (ref 3.5–5.1)
Sodium: 136 mmol/L (ref 135–145)

## 2021-02-13 LAB — I-STAT ARTERIAL BLOOD GAS, ED
Acid-base deficit: 18 mmol/L — ABNORMAL HIGH (ref 0.0–2.0)
Bicarbonate: 7.8 mmol/L — ABNORMAL LOW (ref 20.0–28.0)
Calcium, Ion: 1.07 mmol/L — ABNORMAL LOW (ref 1.15–1.40)
HCT: 37 % — ABNORMAL LOW (ref 39.0–52.0)
Hemoglobin: 12.6 g/dL — ABNORMAL LOW (ref 13.0–17.0)
O2 Saturation: 99 %
Potassium: 5.7 mmol/L — ABNORMAL HIGH (ref 3.5–5.1)
Sodium: 130 mmol/L — ABNORMAL LOW (ref 135–145)
TCO2: 8 mmol/L — ABNORMAL LOW (ref 22–32)
pCO2 arterial: 18.5 mmHg — CL (ref 32.0–48.0)
pH, Arterial: 7.23 — ABNORMAL LOW (ref 7.350–7.450)
pO2, Arterial: 172 mmHg — ABNORMAL HIGH (ref 83.0–108.0)

## 2021-02-13 LAB — DIC (DISSEMINATED INTRAVASCULAR COAGULATION)PANEL
D-Dimer, Quant: >20 ug/mL-FEU — ABNORMAL HIGH (ref 0.00–0.50)
Fibrinogen: 211 mg/dL (ref 210–475)
INR: 2.4 — ABNORMAL HIGH (ref 0.8–1.2)
Platelets: 47 10*3/uL — ABNORMAL LOW (ref 150–400)
Prothrombin Time: 25.9 seconds — ABNORMAL HIGH (ref 11.4–15.2)
aPTT: 74 seconds — ABNORMAL HIGH (ref 24–36)

## 2021-02-13 LAB — GLUCOSE, CAPILLARY
Glucose-Capillary: 125 mg/dL — ABNORMAL HIGH (ref 70–99)
Glucose-Capillary: 160 mg/dL — ABNORMAL HIGH (ref 70–99)
Glucose-Capillary: 234 mg/dL — ABNORMAL HIGH (ref 70–99)

## 2021-02-13 LAB — I-STAT VENOUS BLOOD GAS, ED
Acid-base deficit: 9 mmol/L — ABNORMAL HIGH (ref 0.0–2.0)
Bicarbonate: 17.5 mmol/L — ABNORMAL LOW (ref 20.0–28.0)
Calcium, Ion: 0.81 mmol/L — CL (ref 1.15–1.40)
HCT: 38 % — ABNORMAL LOW (ref 39.0–52.0)
Hemoglobin: 12.9 g/dL — ABNORMAL LOW (ref 13.0–17.0)
O2 Saturation: 66 %
Potassium: 6 mmol/L — ABNORMAL HIGH (ref 3.5–5.1)
Sodium: 132 mmol/L — ABNORMAL LOW (ref 135–145)
TCO2: 19 mmol/L — ABNORMAL LOW (ref 22–32)
pCO2, Ven: 38.6 mmHg — ABNORMAL LOW (ref 44.0–60.0)
pH, Ven: 7.265 (ref 7.250–7.430)
pO2, Ven: 39 mmHg (ref 32.0–45.0)

## 2021-02-13 LAB — RESP PANEL BY RT-PCR (FLU A&B, COVID) ARPGX2
Influenza A by PCR: NEGATIVE
Influenza B by PCR: NEGATIVE
SARS Coronavirus 2 by RT PCR: POSITIVE — AB

## 2021-02-13 LAB — TROPONIN I (HIGH SENSITIVITY)
Troponin I (High Sensitivity): 236 ng/L (ref <18)
Troponin I (High Sensitivity): 211 ng/L (ref <18)

## 2021-02-13 LAB — MRSA NEXT GEN BY PCR, NASAL: MRSA by PCR Next Gen: NOT DETECTED

## 2021-02-13 LAB — LACTIC ACID, PLASMA
Lactic Acid, Venous: >11 mmol/L (ref 0.5–1.9)
Lactic Acid, Venous: >11 mmol/L (ref 0.5–1.9)

## 2021-02-13 IMAGING — DX DG CHEST 1V PORT
1 series · 1 of 1 positions shown · non-contrast
Comparison: Earlier today.

CLINICAL DATA: Intubated.

EXAM:
PORTABLE CHEST 1 VIEW

[chest]
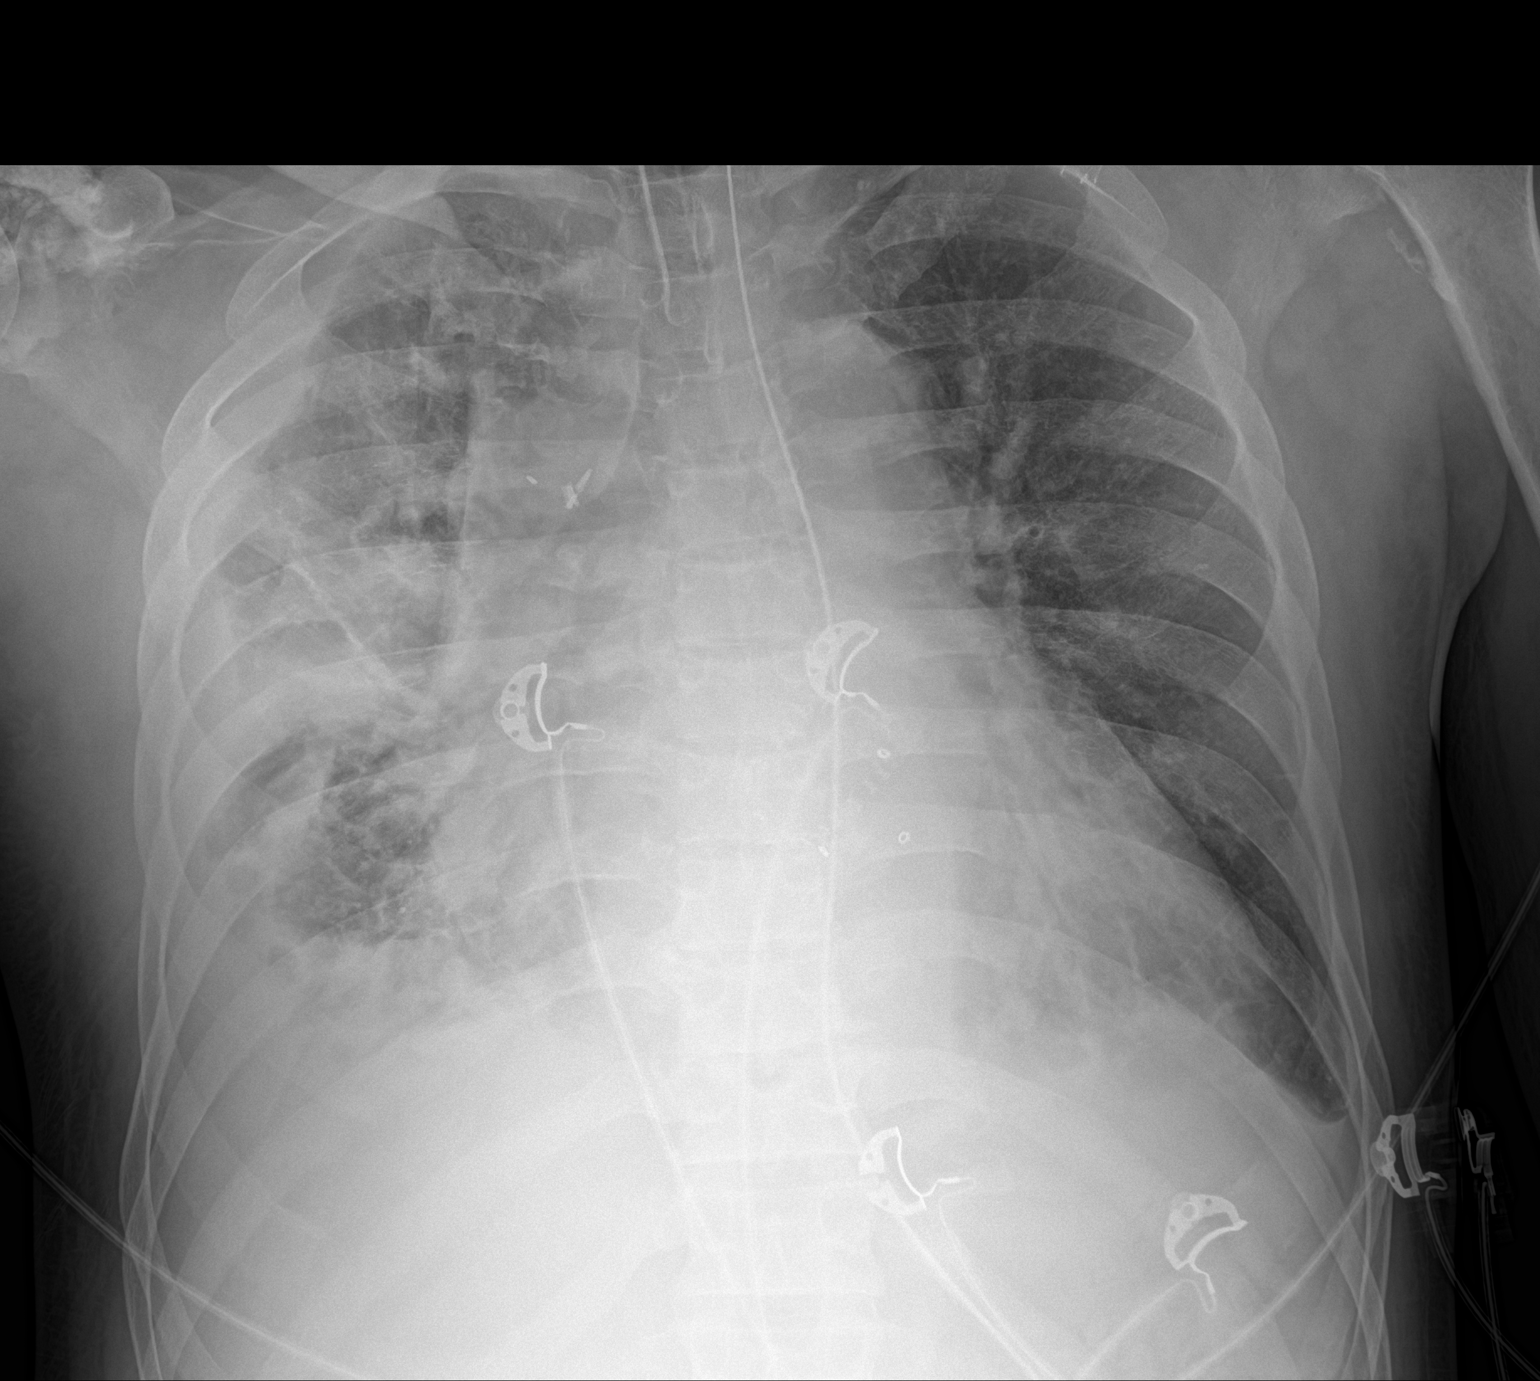

[1 of 1 positions shown; findings below may reference images not displayed]

FINDINGS: Interval endotracheal tube in satisfactory position. Interval
nasogastric tube extending into the stomach. Stable bilateral
pleural effusions, right lung airspace opacity and left basilar
linear atelectasis. Stable enlarged cardiac silhouette. Stable left
subclavian stent. Stable right shoulder calcific density.
IMPRESSION: 1. Stable right pleural effusion and possible pneumonia.
2. Stable small left pleural effusion and left basilar linear
atelectasis.
3. Stable cardiomegaly.

## 2021-02-13 IMAGING — DX DG CHEST 1V PORT
1 series · 2 of 2 positions shown · non-contrast
Comparison: Chest x-rays dated [DATE] and [DATE]. Chest CT
angiogram dated [DATE].

CLINICAL DATA: Respiratory distress.

EXAM:
PORTABLE CHEST 1 VIEW

[Series 1: chest · 0.14mm/px · 2 of 2 slices shown]
[im 1/2]
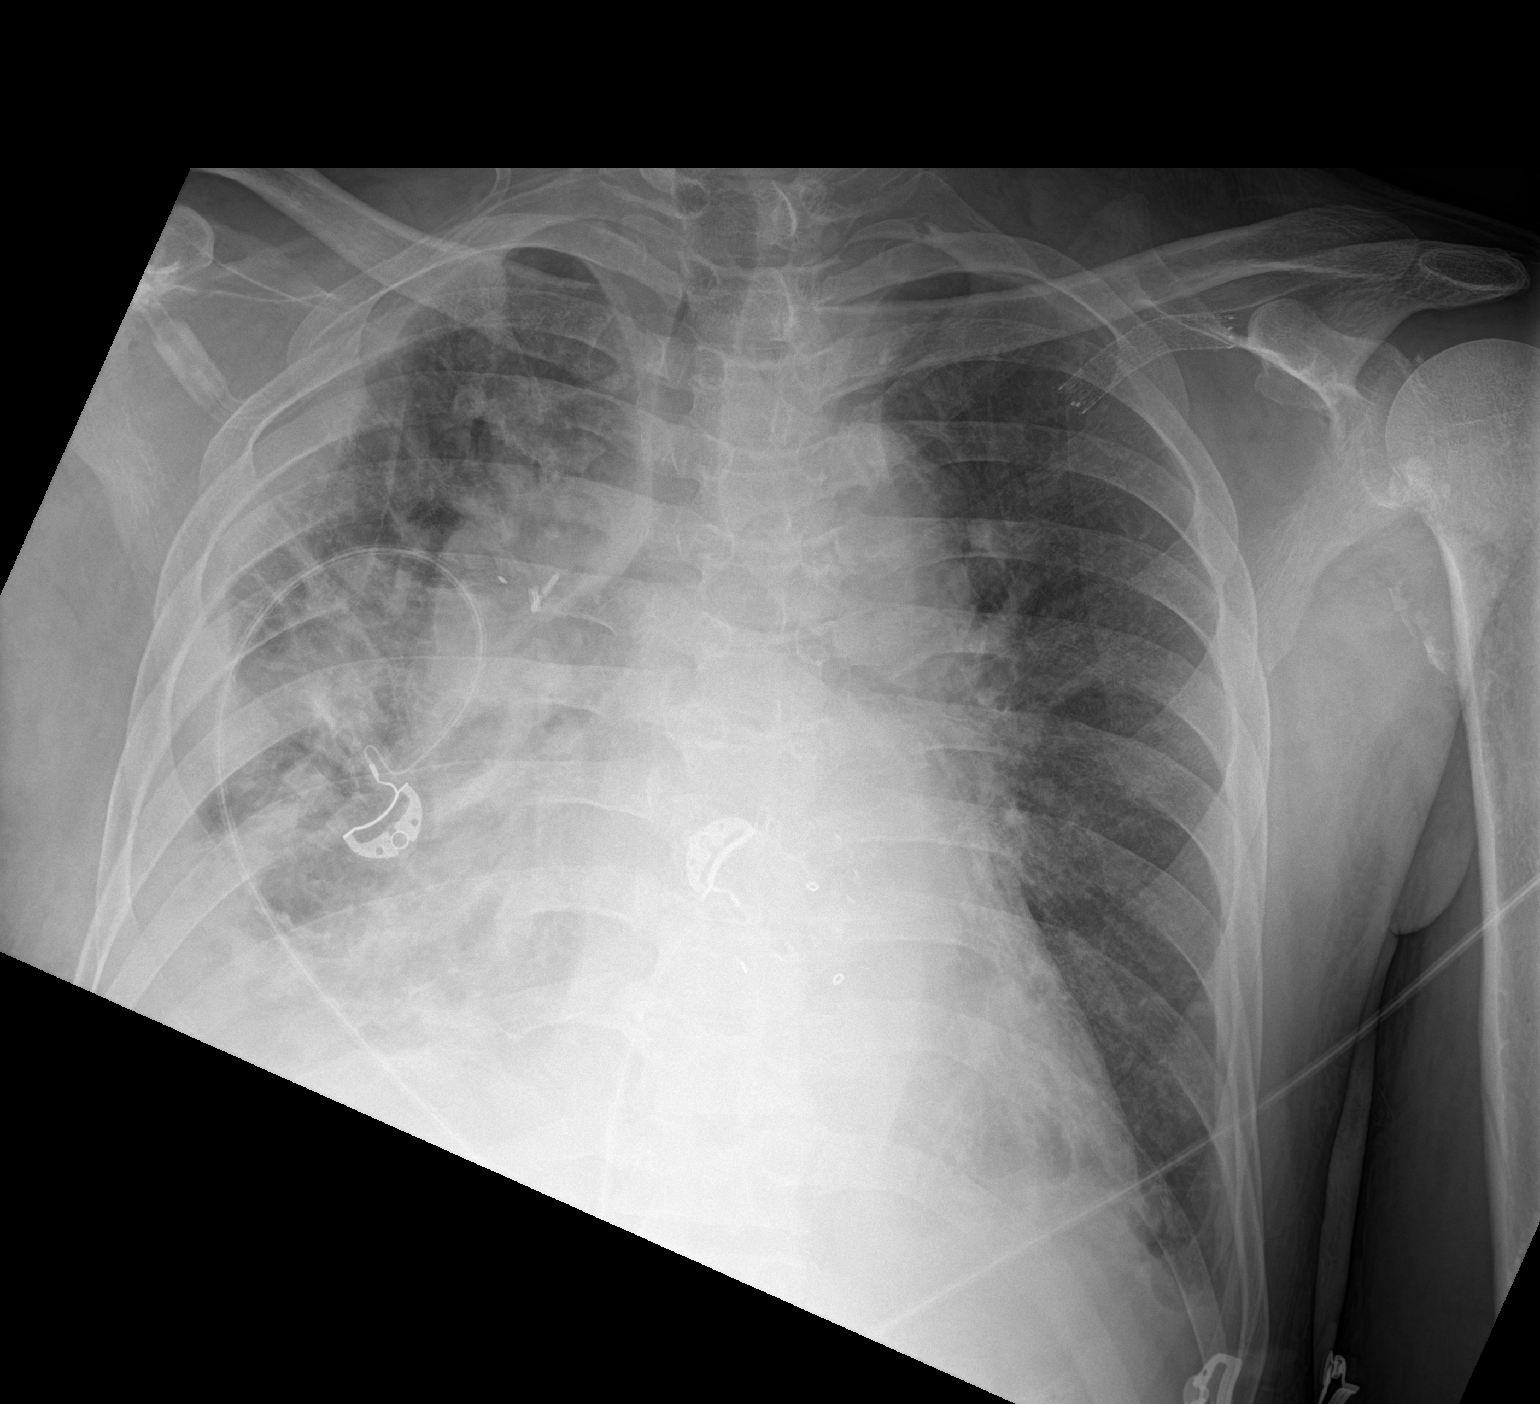
[im 2/2]
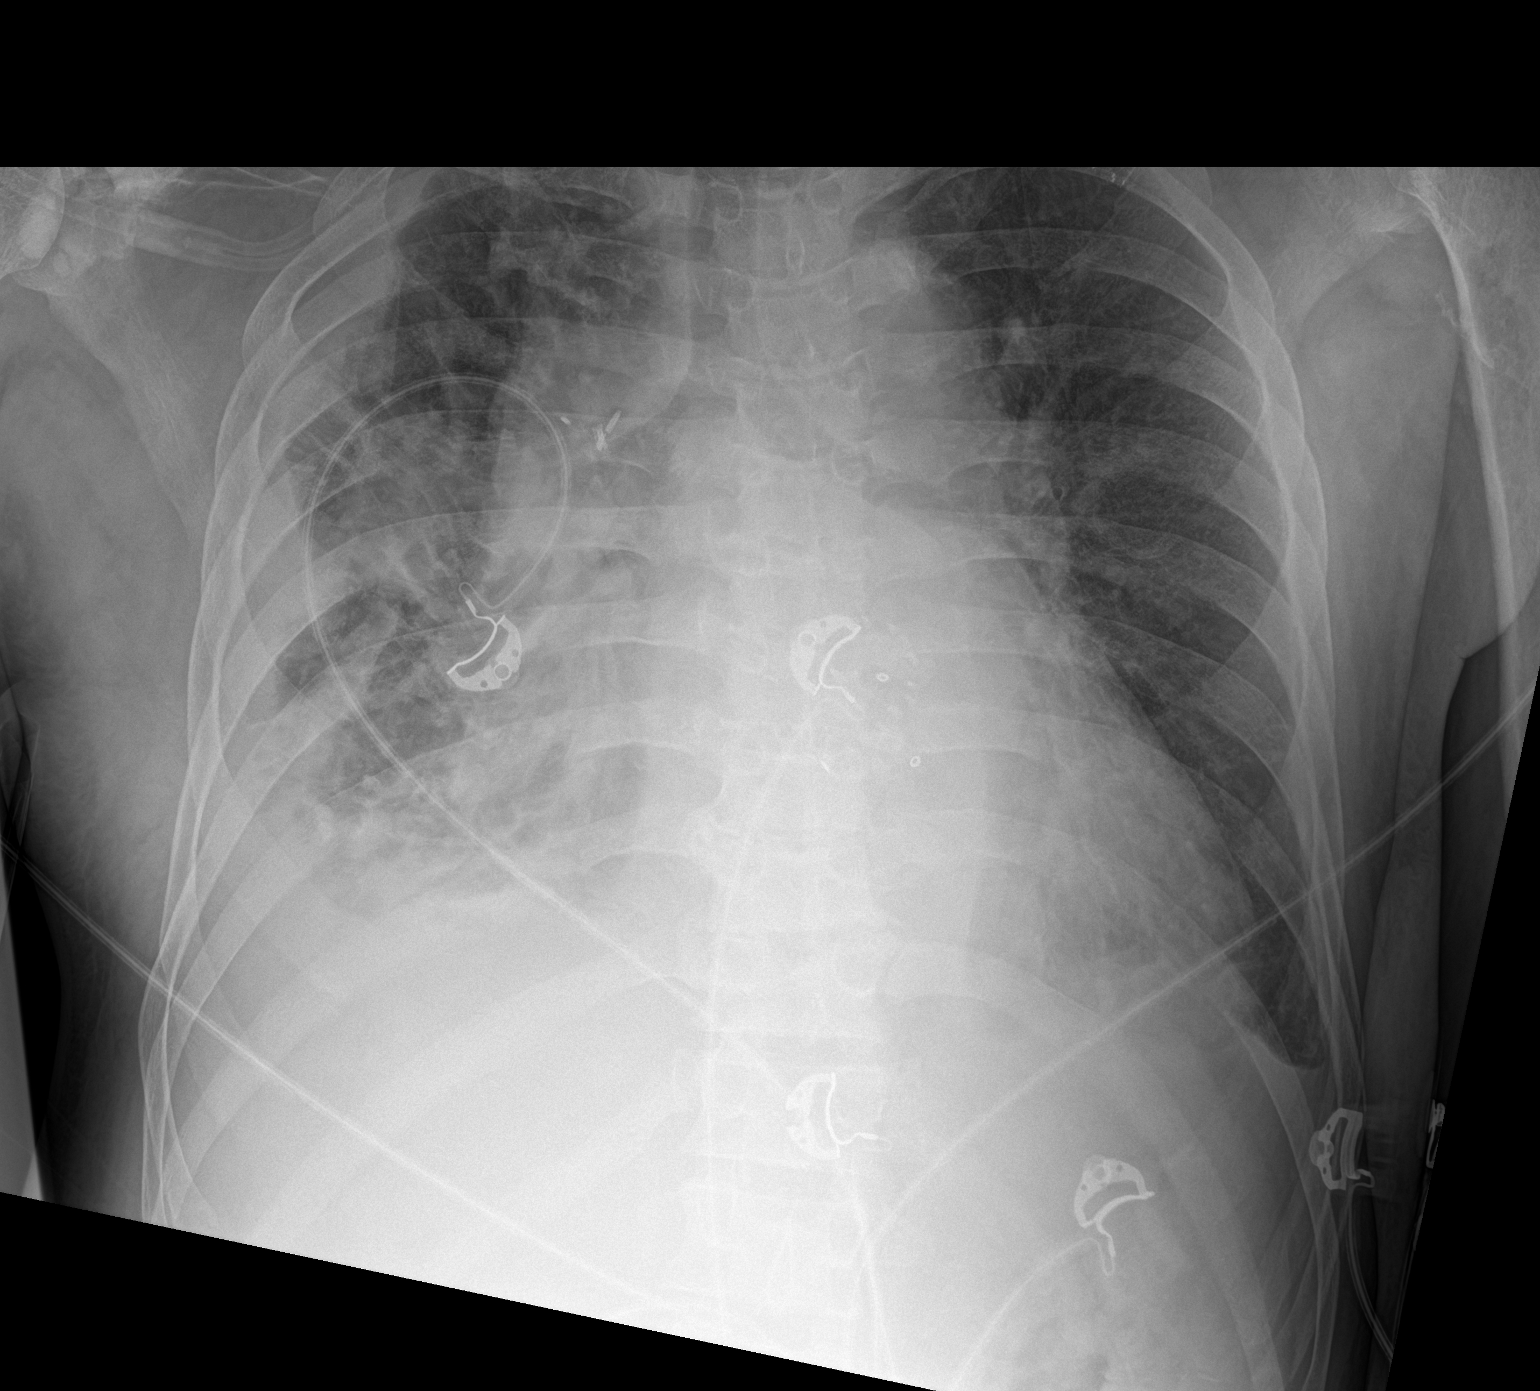

[2 of 2 positions shown; findings below may reference images not displayed]

FINDINGS: Stable cardiomegaly. Overall cardiomediastinal silhouette is grossly
stable in size and configuration.

Patchy airspace opacities within the RIGHT lung, corresponding to
the partially loculated pleural effusions demonstrated on earlier
chest CT, small to moderate in size. Cannot exclude superimposed
pneumonia within the RIGHT mid lung region.

Stable mild atelectasis and small pleural effusion at the LEFT lung
base. No new lung findings. No pneumothorax is seen.
IMPRESSION: 1. Patchy airspace opacities within the RIGHT lung, presumably
corresponding to the partially loculated pleural effusions
demonstrated on earlier chest CT, small to moderate in size.
However, cannot exclude superimposed pneumonia within the RIGHT mid
lung region.
2. Cardiomegaly.

## 2021-02-13 IMAGING — DX DG ABDOMEN 1V
1 series · 1 of 1 positions shown · non-contrast
Comparison: Chest, abdomen and pelvis CT dated [DATE].

CLINICAL DATA: Orogastric tube placement.

EXAM:
ABDOMEN - 1 VIEW

[abdomen]
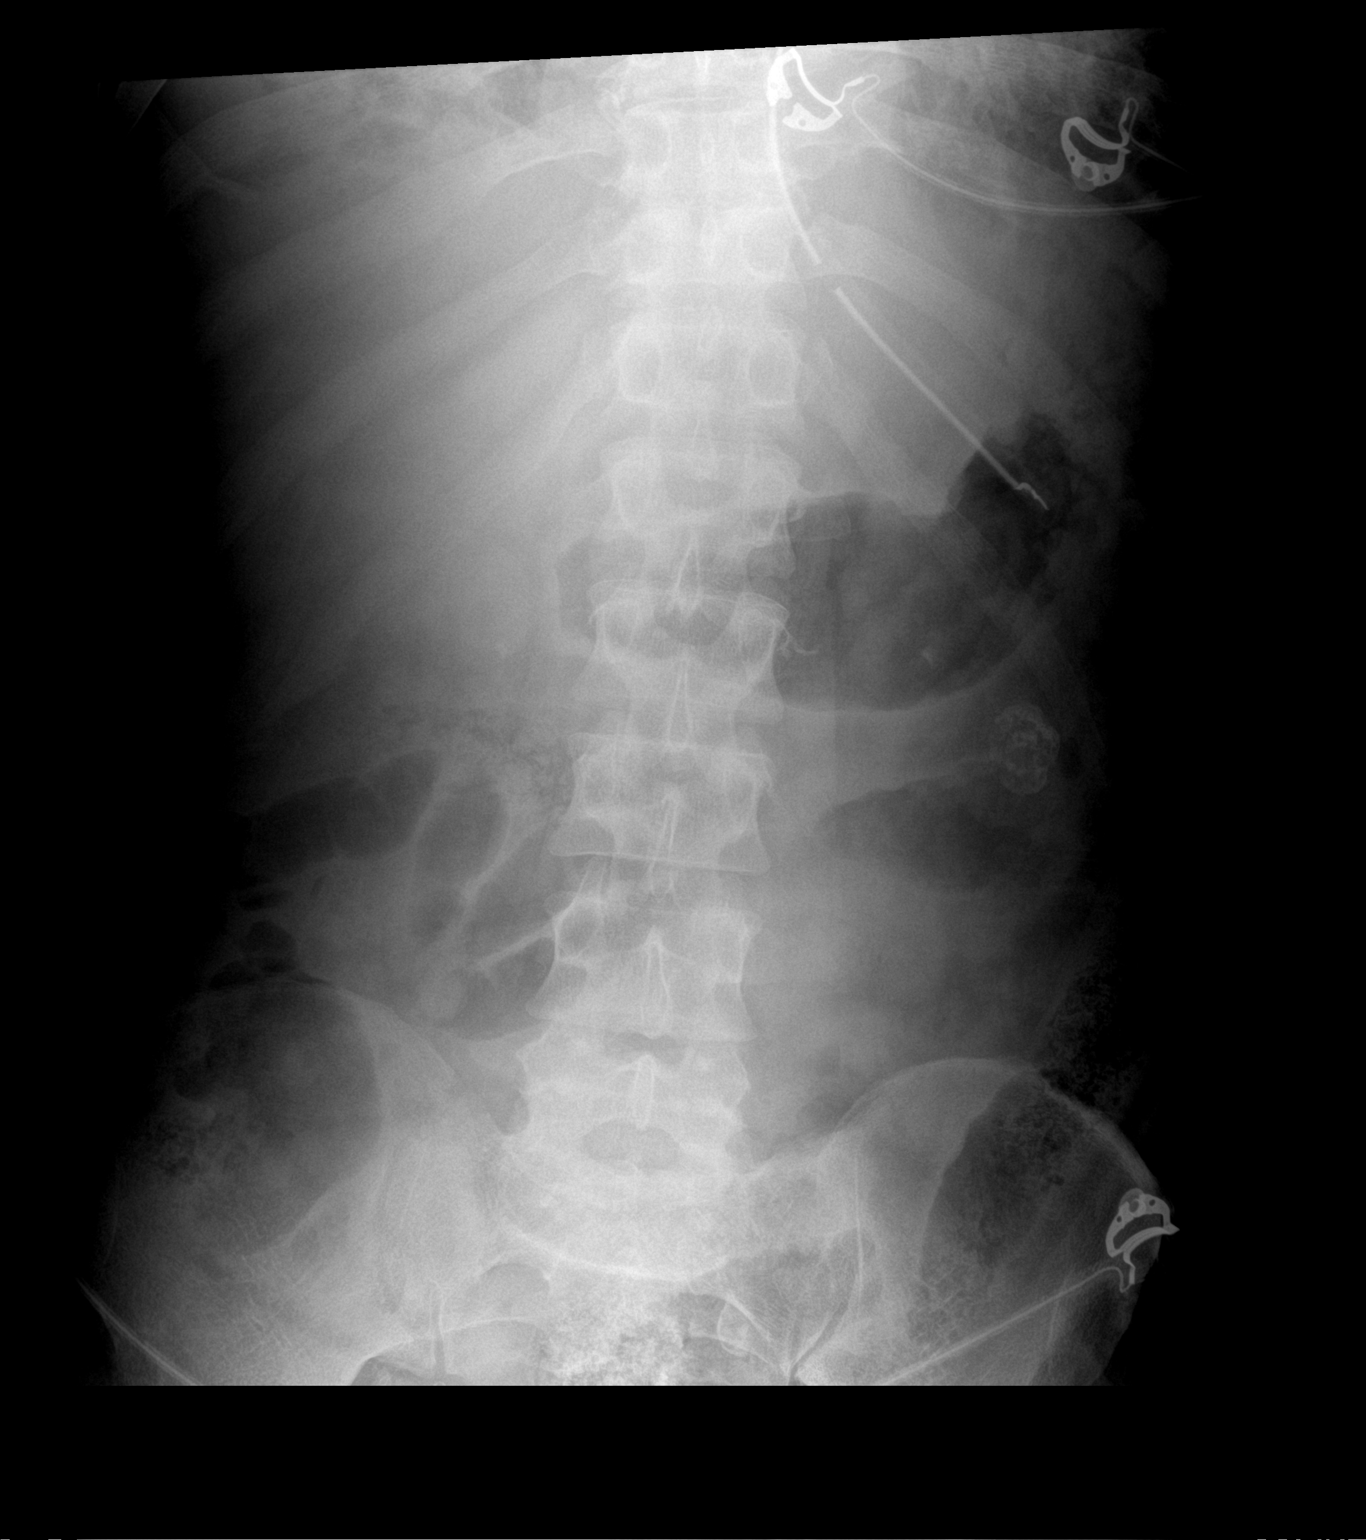

[1 of 1 positions shown; findings below may reference images not displayed]

FINDINGS: Orogastric 2 tip in the proximal to mid stomach and side hole in the
proximal stomach. Normal bowel gas pattern. Mildly prominent stool
in the rectum and elsewhere in the colon. Stable coarse oval
calcification in the left kidney. Lower thoracic spine degenerative
changes.
IMPRESSION: 1. Orogastric tube tip and side hole in the stomach.
2. No acute abnormality.

## 2021-02-13 IMAGING — CT CT HEAD W/O CM
3 series · 15 of 47 positions shown, 18 images · non-contrast
Comparison: MRI [DATE] and CT [DATE]

CLINICAL DATA: Persistent or worsening altered mental status

EXAM:
CT HEAD WITHOUT CONTRAST
TECHNIQUE: Contiguous axial images were obtained from the base of the skull
through the vertex without intravenous contrast.

[Series 4: head 5.0 h30s · axial · 0.44mm/px · z∈[-66,+64]mm · 9 of 32 slices shown, 12 images]
[im 3/32  brain]
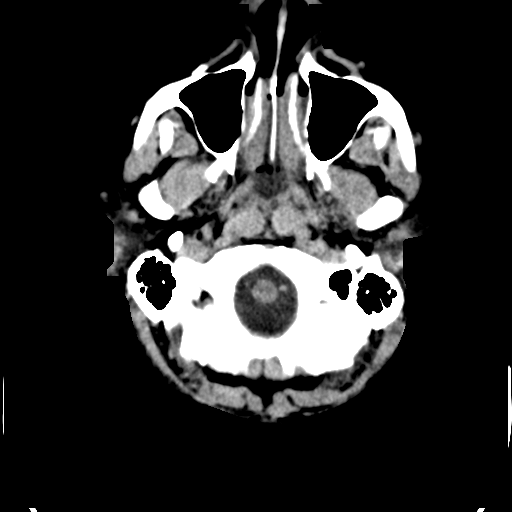
[im 3/32  bone]
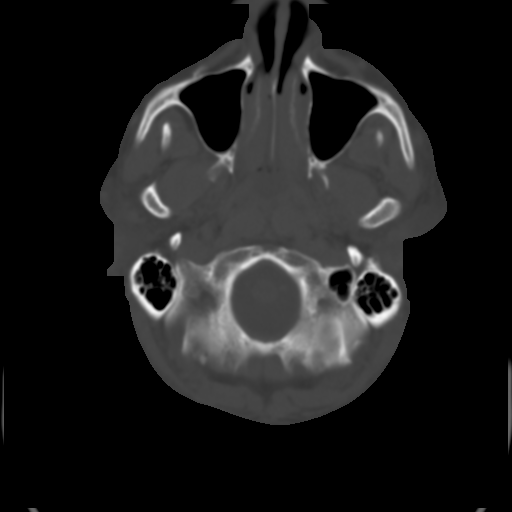
[im 6/32  brain]
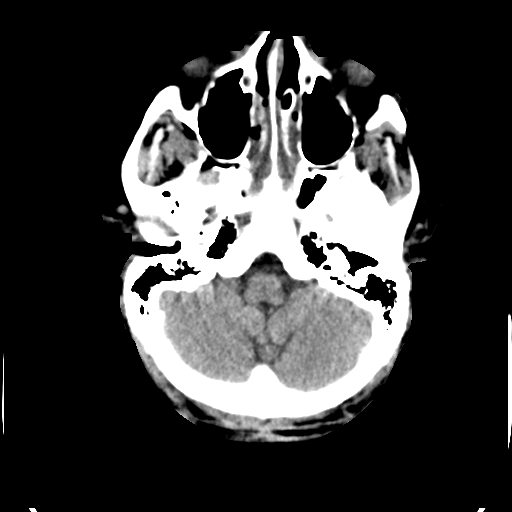
[im 9/32  brain]
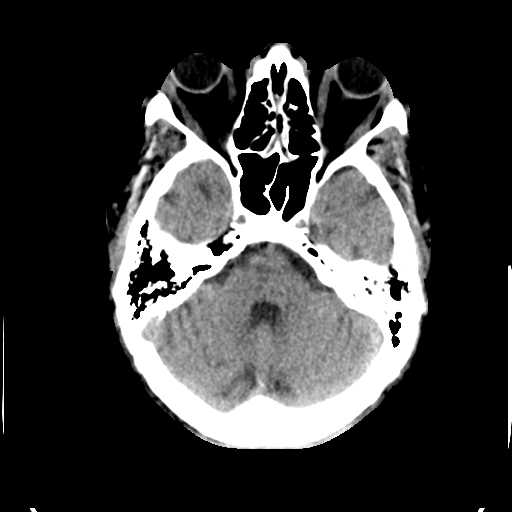
[im 12/32  brain]
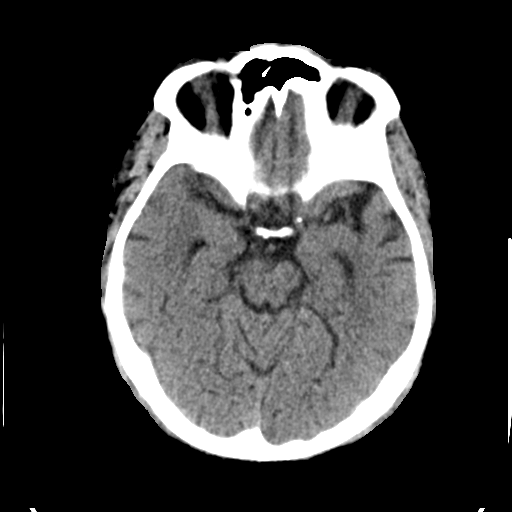
[im 17/32  brain]
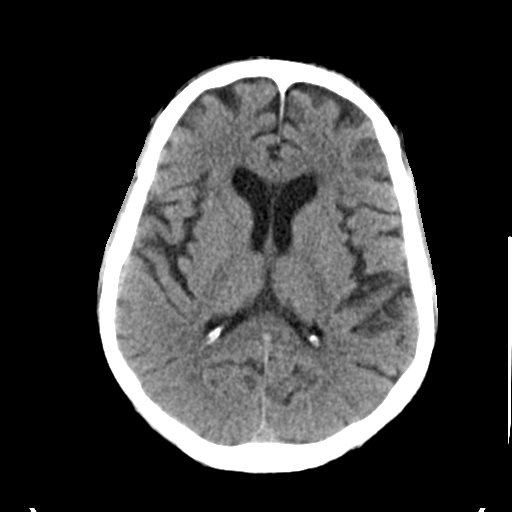
[im 17/32  bone]
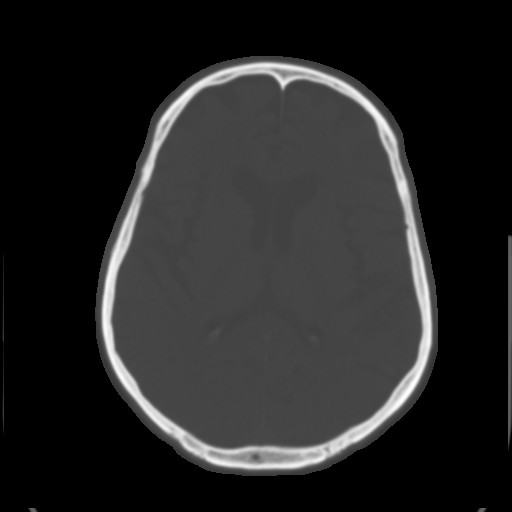
[im 20/32  brain]
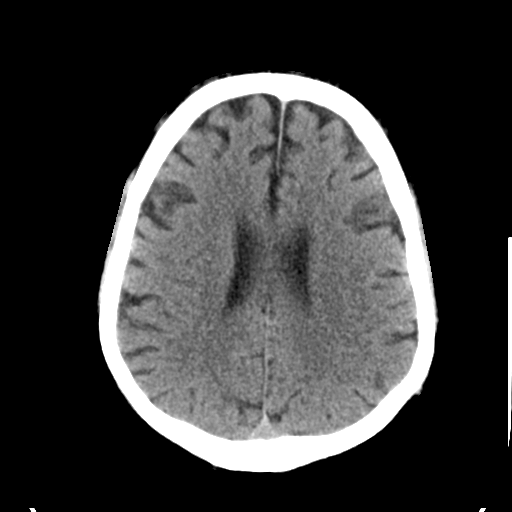
[im 23/32  brain]
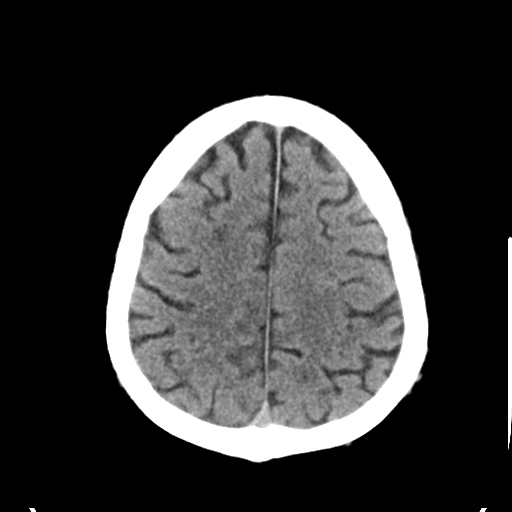
[im 26/32  brain]
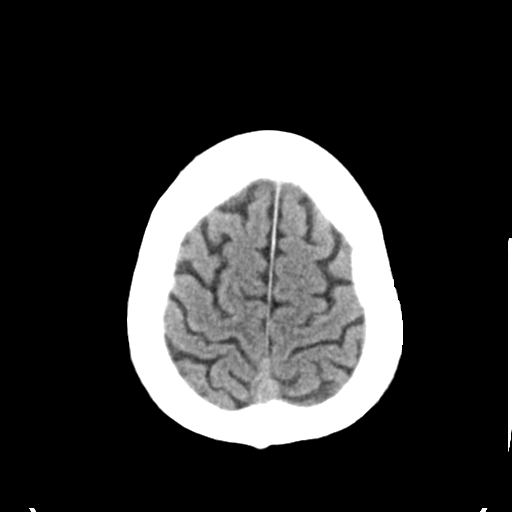
[im 29/32  brain]
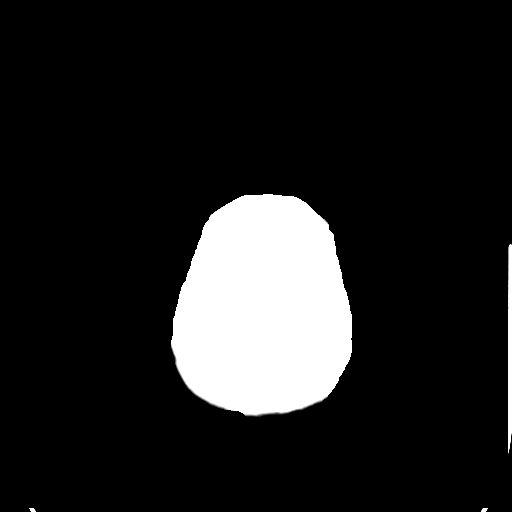
[im 29/32  bone]
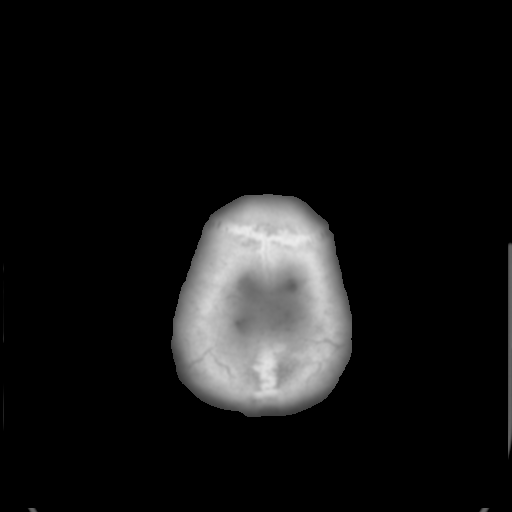

[Series 5: head 3.0 mpr cor · coronal · 0.34mm/px · 3 of 72 slices shown]
[im 24/72  brain]
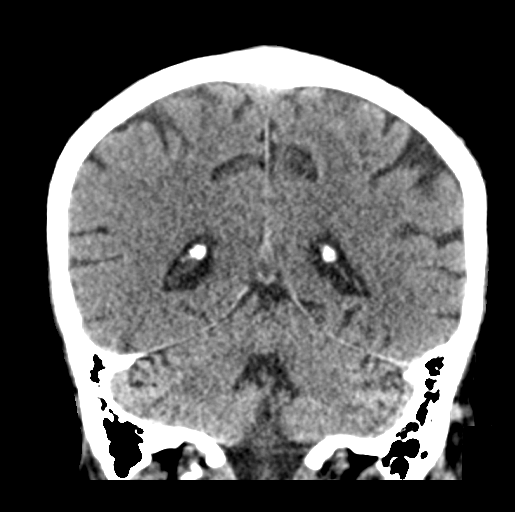
[im 32/72  brain]
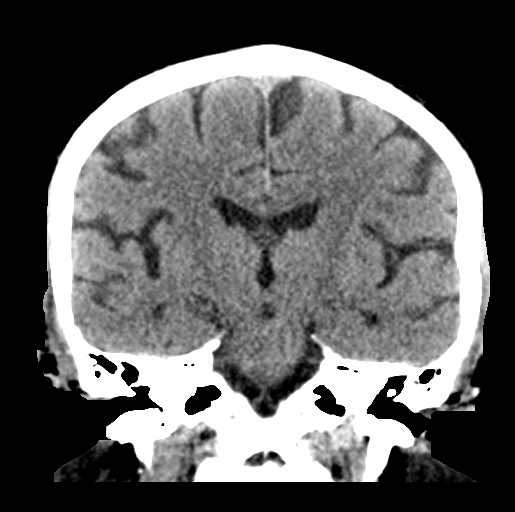
[im 40/72  brain]
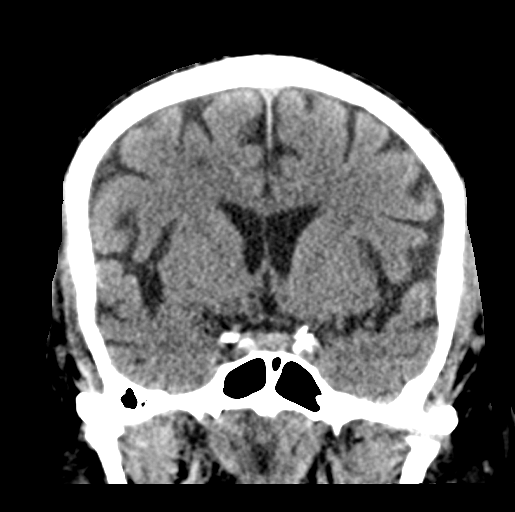

[Series 6: head 3.0 mpr sag · sagittal · 0.33mm/px · 3 of 59 slices shown]
[im 20/59  brain]
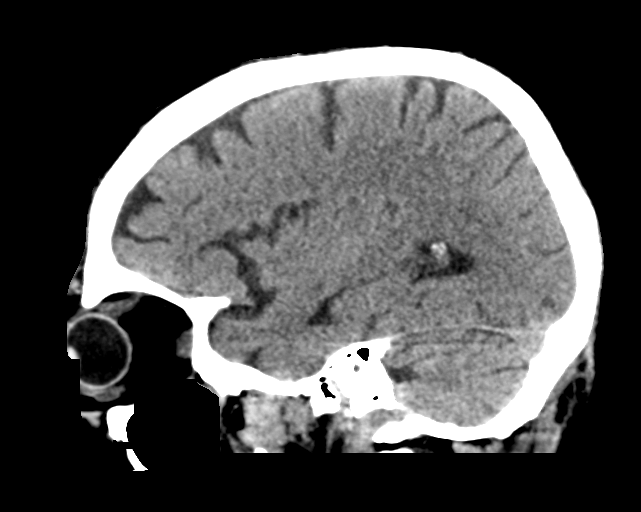
[im 30/59  brain]
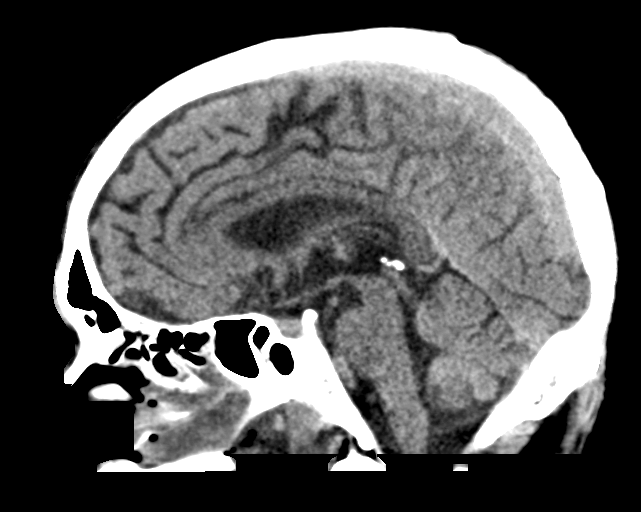
[im 39/59  brain]
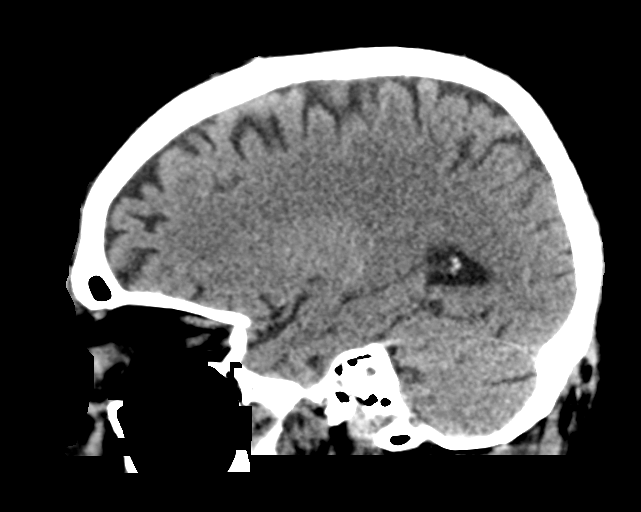

[15 of 47 positions shown; findings below may reference images not displayed]

FINDINGS: Brain: Similar appearance of the hypodensity in the right frontal
periventricular white matter, sequela of prior vascular insult. The
previously visualized punctate foci of restricted diffusion and
microhemorrhage on prior MRI consistent with embolic process are not
well seen on today's examination. No evidence of acute large
vascular territory infarction, hemorrhage, hydrocephalus,
extra-axial collection or mass lesion/mass effect.

Vascular: No hyperdense vessel. Atherosclerotic calcifications of
the internal carotid arteries at skull base.

Skull: Normal. Negative for fracture or focal lesion.

Sinuses/Orbits: Mucosal thickening of the sphenoid sinuses and
ethmoid air cells. Mastoid air cells are predominantly clear. Orbits
are grossly unremarkable.

Other: None
IMPRESSION: 1. No CT evidence of acute intracranial pathology.
2. Similar appearance of the hypodensity in the right frontal
periventricular white matter, sequela of prior vascular insult.
3. The previously visualized punctate foci of restricted diffusion
and microhemorrhage on prior MRI consistent with embolic process are
not well seen on today's examination.
4. Mucosal thickening of the sphenoid sinuses and ethmoid air cells.

## 2021-02-13 IMAGING — DX DG CHEST 1V PORT
1 series · 1 of 1 positions shown · non-contrast
Comparison: Chest radiograph dated [DATE].

CLINICAL DATA: 49-year-old male with central line placement.

EXAM:
PORTABLE CHEST 1 VIEW

[chest]
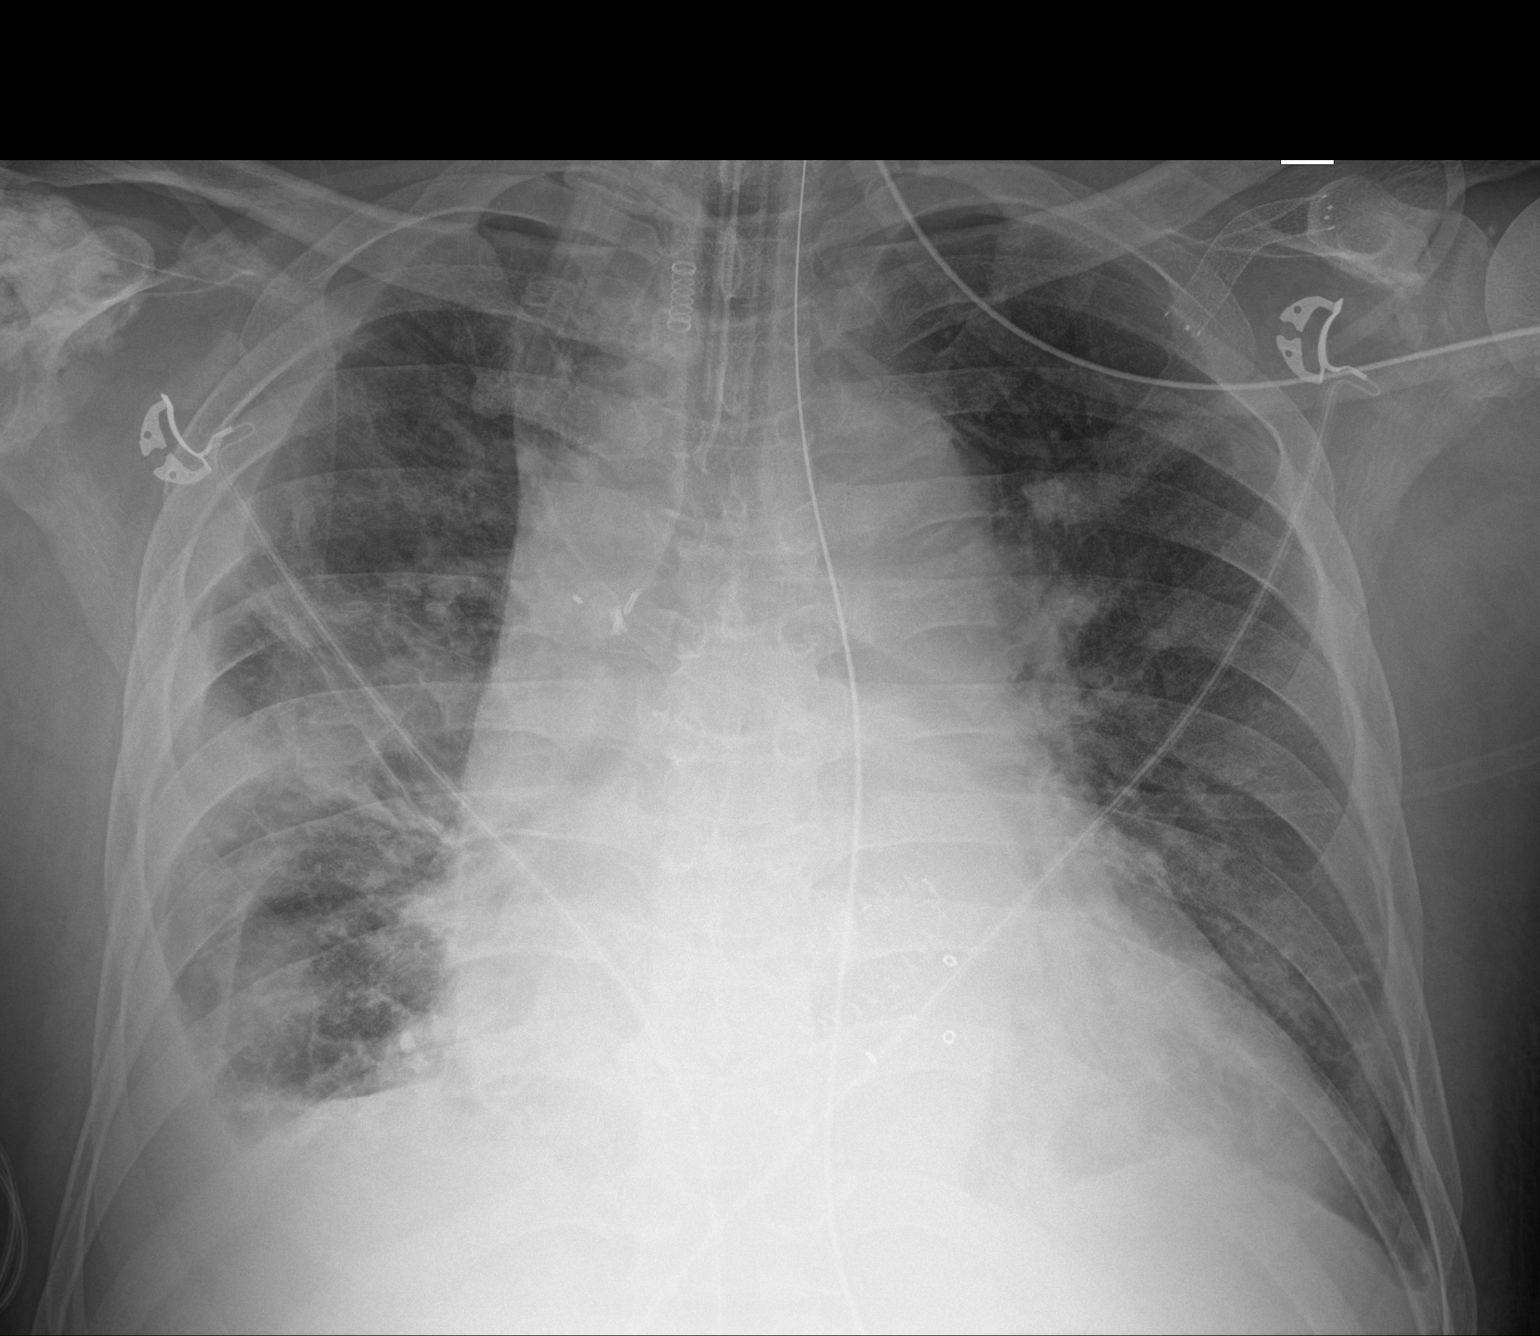

[1 of 1 positions shown; findings below may reference images not displayed]

FINDINGS: Endotracheal tube with tip approximately 3 cm above the carina.
Enteric tube extends below the diaphragm with tip beyond the
inferior margin of the image. Additional support line noted across
the upper chest, likely overlie the patient.

Similar appearance of right pleural effusion. Slight improvement in
aeration of the right lung with decrease in the diffuse pulmonary
opacity since the earlier radiograph. Stable left lower lung field
densities. No pneumothorax. Stable enlarged cardiomediastinal
silhouette. Left subclavian vascular stent. No acute osseous
pathology.
IMPRESSION: 1. Slight improvement in aeration of the right lung. No other
interval change.
2. Stable positioning of the support apparatus.

## 2021-02-13 MED ORDER — VANCOMYCIN HCL 1500 MG/300ML IV SOLN
1500.0000 mg | Freq: Once | INTRAVENOUS | Status: AC
  Administered 2021-02-13: 1500 mg via INTRAVENOUS
  Filled 2021-02-13: qty 300

## 2021-02-13 MED ORDER — CALCIUM GLUCONATE-NACL 1-0.675 GM/50ML-% IV SOLN
INTRAVENOUS | Status: AC
  Filled 2021-02-13: qty 100

## 2021-02-13 MED ORDER — SODIUM BICARBONATE 8.4 % IV SOLN
INTRAVENOUS | Status: DC
  Filled 2021-02-13 (×3): qty 1000

## 2021-02-13 MED ORDER — HEPARIN SODIUM (PORCINE) 1000 UNIT/ML DIALYSIS: 1000.0000 [IU] | INTRAMUSCULAR | Status: DC | PRN

## 2021-02-13 MED ORDER — ETOMIDATE 2 MG/ML IV SOLN
INTRAVENOUS | Status: AC | PRN
  Administered 2021-02-13: 20 mg via INTRAVENOUS

## 2021-02-13 MED ORDER — PRISMASOL BGK 0/2.5 32-2.5 MEQ/L EC SOLN
Status: DC
  Filled 2021-02-13 (×2): qty 5000

## 2021-02-13 MED ORDER — FENTANYL CITRATE (PF) 100 MCG/2ML IJ SOLN
50.0000 ug | Freq: Once | INTRAMUSCULAR | Status: DC
Start: 2021-02-13 — End: 2021-02-14

## 2021-02-13 MED ORDER — VANCOMYCIN HCL 750 MG/150ML IV SOLN: 750.0000 mg | INTRAVENOUS | Status: DC

## 2021-02-13 MED ORDER — DEXMEDETOMIDINE HCL IN NACL 400 MCG/100ML IV SOLN
0.0000 ug/kg/h | INTRAVENOUS | Status: AC
Start: 2021-02-13 — End: 2021-02-16
  Administered 2021-02-14: 0.2 ug/kg/h via INTRAVENOUS
  Administered 2021-02-15: 0.7 ug/kg/h via INTRAVENOUS
  Administered 2021-02-15: 0.8 ug/kg/h via INTRAVENOUS
  Filled 2021-02-13 (×6): qty 100

## 2021-02-13 MED ORDER — CALCIUM CHLORIDE 10 % IV SOLN
INTRAVENOUS | Status: AC
  Administered 2021-02-13: 1 g via INTRAVENOUS
  Filled 2021-02-13: qty 10

## 2021-02-13 MED ORDER — SODIUM BICARBONATE 8.4 % IV SOLN: 50.0000 meq | Freq: Once | INTRAVENOUS | Status: AC

## 2021-02-13 MED ORDER — SODIUM CHLORIDE 0.9 % IV SOLN
500.0000 mg | INTRAVENOUS | Status: DC
  Administered 2021-02-13 – 2021-02-16 (×4): 500 mg via INTRAVENOUS
  Filled 2021-02-13 (×5): qty 500

## 2021-02-13 MED ORDER — HYDROCORTISONE NA SUCCINATE PF 100 MG IJ SOLR
100.0000 mg | Freq: Two times a day (BID) | INTRAMUSCULAR | Status: DC
  Administered 2021-02-13: 100 mg via INTRAVENOUS
  Filled 2021-02-13: qty 2

## 2021-02-13 MED ORDER — CALCIUM CHLORIDE 10 % IV SOLN: 1.0000 g | Freq: Once | INTRAVENOUS | Status: AC

## 2021-02-13 MED ORDER — SODIUM BICARBONATE 8.4 % IV SOLN
INTRAVENOUS | Status: AC
  Administered 2021-02-13: 50 meq
  Filled 2021-02-13: qty 50

## 2021-02-13 MED ORDER — POLYETHYLENE GLYCOL 3350 17 G PO PACK: 17.0000 g | PACK | Freq: Every day | ORAL | Status: DC | PRN

## 2021-02-13 MED ORDER — DEXTROSE 10 % IV SOLN: INTRAVENOUS | Status: DC

## 2021-02-13 MED ORDER — SODIUM CHLORIDE 0.9 % IV SOLN
100.0000 mg | Freq: Every day | INTRAVENOUS | Status: DC
  Administered 2021-02-14: 100 mg via INTRAVENOUS
  Filled 2021-02-13 (×2): qty 20

## 2021-02-13 MED ORDER — SODIUM CHLORIDE 0.9 % IV SOLN
2.0000 g | INTRAVENOUS | Status: AC
  Administered 2021-02-14 – 2021-02-18 (×5): 2 g via INTRAVENOUS
  Filled 2021-02-13 (×5): qty 20

## 2021-02-13 MED ORDER — DEXTROSE 50 % IV SOLN: 25.0000 g | Freq: Once | INTRAVENOUS | Status: AC

## 2021-02-13 MED ORDER — FENTANYL CITRATE (PF) 100 MCG/2ML IJ SOLN
50.0000 ug | INTRAMUSCULAR | Status: DC | PRN
  Administered 2021-02-14: 50 ug via INTRAVENOUS

## 2021-02-13 MED ORDER — SODIUM CHLORIDE 0.9 % IV SOLN: 2.0000 g | INTRAVENOUS | Status: DC

## 2021-02-13 MED ORDER — VASOPRESSIN 20 UNITS/100 ML INFUSION FOR SHOCK
0.0000 [IU]/min | INTRAVENOUS | Status: DC
  Administered 2021-02-13: 0.02 [IU]/min via INTRAVENOUS
  Administered 2021-02-14 – 2021-02-15 (×2): 0.03 [IU]/min via INTRAVENOUS
  Administered 2021-02-15: 0.02 [IU]/min via INTRAVENOUS
  Administered 2021-02-15 – 2021-02-17 (×6): 0.03 [IU]/min via INTRAVENOUS
  Administered 2021-02-18: 0.02 [IU]/min via INTRAVENOUS
  Administered 2021-02-18: 0.03 [IU]/min via INTRAVENOUS
  Filled 2021-02-13 (×13): qty 100

## 2021-02-13 MED ORDER — SODIUM CHLORIDE 0.9 % IV SOLN
200.0000 mg | Freq: Once | INTRAVENOUS | Status: AC
  Administered 2021-02-13: 200 mg via INTRAVENOUS
  Filled 2021-02-13 (×2): qty 40

## 2021-02-13 MED ORDER — SODIUM CHLORIDE 0.9 % IV SOLN
250.0000 mL | INTRAVENOUS | Status: DC
  Administered 2021-02-13: 250 mL via INTRAVENOUS

## 2021-02-13 MED ORDER — HEPARIN SODIUM (PORCINE) 5000 UNIT/ML IJ SOLN: 5000.0000 [IU] | Freq: Three times a day (TID) | INTRAMUSCULAR | Status: DC

## 2021-02-13 MED ORDER — ROCURONIUM BROMIDE 50 MG/5ML IV SOLN
INTRAVENOUS | Status: AC | PRN
  Administered 2021-02-13: 80 mg via INTRAVENOUS

## 2021-02-13 MED ORDER — SODIUM CHLORIDE 0.9 % IV SOLN
1.0000 g | Freq: Once | INTRAVENOUS | Status: AC
  Administered 2021-02-13: 1 g via INTRAVENOUS
  Filled 2021-02-13: qty 10

## 2021-02-13 MED ORDER — NOREPINEPHRINE 4 MG/250ML-% IV SOLN
0.0000 ug/min | INTRAVENOUS | Status: DC
  Administered 2021-02-13: 10 ug/min via INTRAVENOUS
  Administered 2021-02-13: 20 ug/min via INTRAVENOUS
  Administered 2021-02-14: 12 ug/min via INTRAVENOUS
  Administered 2021-02-14: 5 ug/min via INTRAVENOUS
  Filled 2021-02-13 (×5): qty 250

## 2021-02-13 MED ORDER — SODIUM BICARBONATE 8.4 % IV SOLN
50.0000 meq | Freq: Once | INTRAVENOUS | Status: AC
  Administered 2021-02-13: 50 meq via INTRAVENOUS

## 2021-02-13 MED ORDER — VANCOMYCIN VARIABLE DOSE PER UNSTABLE RENAL FUNCTION (PHARMACIST DOSING): Status: DC

## 2021-02-13 MED ORDER — FENTANYL 2500MCG IN NS 250ML (10MCG/ML) PREMIX INFUSION
50.0000 ug/h | INTRAVENOUS | Status: DC
  Administered 2021-02-14: 50 ug/h via INTRAVENOUS
  Filled 2021-02-13 (×2): qty 250

## 2021-02-13 MED ORDER — LACTATED RINGERS IV BOLUS: 30.0000 mL/kg | Freq: Once | INTRAVENOUS | Status: AC

## 2021-02-13 MED ORDER — FENTANYL CITRATE (PF) 100 MCG/2ML IJ SOLN
50.0000 ug | INTRAMUSCULAR | Status: DC | PRN
Start: 2021-02-13 — End: 2021-02-16

## 2021-02-13 MED ORDER — PANTOPRAZOLE SODIUM 40 MG IV SOLR
40.0000 mg | Freq: Every day | INTRAVENOUS | Status: DC
  Administered 2021-02-13 – 2021-02-16 (×4): 40 mg via INTRAVENOUS
  Filled 2021-02-13 (×4): qty 40

## 2021-02-13 MED ORDER — HYDROCORTISONE NA SUCCINATE PF 100 MG IJ SOLR
100.0000 mg | Freq: Once | INTRAMUSCULAR | Status: AC
  Administered 2021-02-13: 100 mg via INTRAVENOUS
  Filled 2021-02-13: qty 2

## 2021-02-13 MED ORDER — DOCUSATE SODIUM 50 MG/5ML PO LIQD
100.0000 mg | Freq: Two times a day (BID) | ORAL | Status: DC
  Administered 2021-02-13: 100 mg
  Filled 2021-02-13: qty 10

## 2021-02-13 MED ORDER — POLYETHYLENE GLYCOL 3350 17 G PO PACK
17.0000 g | PACK | Freq: Every day | ORAL | Status: DC
  Administered 2021-02-17 – 2021-02-18 (×2): 17 g
  Filled 2021-02-13 (×2): qty 1

## 2021-02-13 MED ORDER — FENTANYL BOLUS VIA INFUSION
50.0000 ug | INTRAVENOUS | Status: DC | PRN
  Administered 2021-02-13: 150 ug via INTRAVENOUS
  Filled 2021-02-13: qty 100

## 2021-02-13 MED ORDER — PRISMASOL BGK 0/2.5 32-2.5 MEQ/L EC SOLN
Status: DC
  Filled 2021-02-13 (×9): qty 5000

## 2021-02-13 MED ORDER — DOCUSATE SODIUM 100 MG PO CAPS: 100.0000 mg | ORAL_CAPSULE | Freq: Two times a day (BID) | ORAL | Status: DC | PRN

## 2021-02-13 MED ORDER — DEXTROSE 50 % IV SOLN
INTRAVENOUS | Status: AC
  Administered 2021-02-13: 25 g via INTRAVENOUS
  Filled 2021-02-13: qty 50

## 2021-02-13 MED ORDER — PRISMASOL BGK 0/2.5 32-2.5 MEQ/L EC SOLN
Status: DC
  Filled 2021-02-13 (×3): qty 5000

## 2021-02-13 MED ORDER — CALCIUM GLUCONATE-NACL 2-0.675 GM/100ML-% IV SOLN
2.0000 g | Freq: Once | INTRAVENOUS | Status: AC
  Administered 2021-02-13: 2000 mg via INTRAVENOUS
  Filled 2021-02-13: qty 100

## 2021-02-13 MED ORDER — PHENYLEPHRINE HCL-NACL 10-0.9 MG/250ML-% IV SOLN
25.0000 ug/min | INTRAVENOUS | Status: DC
  Administered 2021-02-13: 50 ug/min via INTRAVENOUS

## 2021-02-13 NOTE — Consult Note (Signed)
ESRD Consult Note  Requesting provider: Julian Hy, DO  Outpatient dialysis unit: [facility] Outpatient dialysis schedule: MWF  Assessment/Recommendations:   ESRD:  -Outpatient orders: 4hrs, f180, 350/500, edw 66.5kg, 2k, 2cal, uf profile 1,  -will need to be on CRRT, recommend temp HD line placement, appreciate assistance from CCM -all 2k bags,   Septic shock -secondary to pneumonia -pressor support and abx per ccm  COVID-19 positive AHRF 2/2 pneumonia and parapneumonic effusion -vent mgmt per ccm  Hyperkalemia -slightly improved, c/w med management. Will need CRRT soon via temp line.  AGMA -check lactate -bicarb support thru crrt  Volume/ hypertension: EDW 66.5kg. pressors/crrt as above  Anemia of Chronic Kidney Disease: Hemoglobin 12.6. Currently receiving mircera 131mg. Last dose 6/27  Secondary Hyperparathyroidism/Hyperphosphatemia: monitor phos, resume binders when taking PO   Vascular access: avg  # Additional recommendations: - Dose all meds for creatinine clearance < 10 ml/min  - Unless absolutely necessary, no MRIs with gadolinium.  - Implement save arm precautions.  Prefer needle sticks in the dorsum of the hands or wrists.  No blood pressure measurements in arm. - If blood transfusion is requested during hemodialysis sessions, please alert u[place] to the session.  - If a hemodialysis catheter line culture is requested, please alert u[place] only hemodialysis nurses are able to collect those specimens.   Recommendations were discussed with the primary team.   History of Present Illness: CKaigen Sperandiois a/an 50y.o. male with a past medical history of ESRD, history of endocarditis and discitis with recent MRSA bacteremia, bioprosthetic MVR, HFpEF, cirrhosis, BKA, OSA who presents with hypoglycemia and unresponsive.  Developed progressive hypoxia required intubation. Was intubated for airway protection and for acute hypoxic respiratory failure. Was found  to have a blood pressure as low as 66/37, currently on Levophed (, and vasopressin. Chart reviewed in e-cube as well.  Last dialysis was on 7/8 and appears it might without incident. ROS unobtainable: sedated, intubated. Patient not examined today in the setting of the COVID-19 pandemic and in efforts to preserve PPE. Patient is COVID positive.  Medications:  Current Facility-Administered Medications  Medication Dose Route Frequency Provider Last Rate Last Admin   0.9 %  sodium chloride infusion  250 mL Intravenous Continuous CNoemi ChapelP, DO 20 mL/hr at 02/21/2021 1256 250 mL at  1256   azithromycin (ZITHROMAX) 500 mg in sodium chloride 0.9 % 250 mL IVPB  500 mg Intravenous Q24H Horton, Kristie M, DO   Stopped at 02/04/2021 1209   calcium gluconate in NaCl 1-0.675 GM/50ML-% IVPB            [START ON 02/14/2021] cefTRIAXone (ROCEPHIN) 2 g in sodium chloride 0.9 % 100 mL IVPB  2 g Intravenous Q24H OBertis Ruddy RPH       dexmedetomidine (PRECEDEX) 400 MCG/100ML (4 mcg/mL) infusion  0-1.2 mcg/kg/hr Intravenous Continuous CNoemi ChapelP, DO       dextrose 10 % infusion   Intravenous Continuous Horton, Kristie M, DO 100 mL/hr at 02/26/2021 1044 New Bag at 03/03/2021 1044   docusate (COLACE) 50 MG/5ML liquid 100 mg  100 mg Per Tube BID CNoemi ChapelP, DO       docusate sodium (COLACE) capsule 100 mg  100 mg Oral BID PRN CJulian Hy DO       fentaNYL (SUBLIMAZE) bolus via infusion 50-100 mcg  50-100 mcg Intravenous Q15 min PRN Horton, Kristie M, DO   150 mcg at 02/17/2021 1009   fentaNYL (SUBLIMAZE) injection  50 mcg  50 mcg Intravenous Once Horton, Kristie M, DO       fentaNYL (SUBLIMAZE) injection 50 mcg  50 mcg Intravenous Q15 min PRN Noemi Chapel P, DO       fentaNYL (SUBLIMAZE) injection 50-200 mcg  50-200 mcg Intravenous Q30 min PRN Noemi Chapel P, DO       fentaNYL 2519mg in NS 2559m(1042mml) infusion-PREMIX  50-200 mcg/hr Intravenous Continuous Horton, Kristie M, DO   Stopped at  02/23/2021 1209   hydrocortisone sodium succinate (SOLU-CORTEF) 100 MG injection 100 mg  100 mg Intravenous Q12H ClaNoemi Chapel DO       norepinephrine (LEVOPHED) '4mg'$  in 250m29memix infusion  0-40 mcg/min Intravenous Continuous Horton, Kristie M, DO 112.5 mL/hr at 02/10/2021 1320 30 mcg/min at 02/19/2021 1320   pantoprazole (PROTONIX) injection 40 mg  40 mg Intravenous QHS ClarNoemi ChapelDO       phenylephrine (NEOSYNEPHRINE) 10-0.9 MG/250ML-% infusion  25-200 mcg/min Intravenous Titrated ClarJulian Hy 75 mL/hr at 02/28/2021 1209 50 mcg/min at 03/04/2021 1209   polyethylene glycol (MIRALAX / GLYCOLAX) packet 17 g  17 g Per Tube Daily ClarNoemi ChapelDO       polyethylene glycol (MIRALAX / GLYCOLAX) packet 17 g  17 g Oral Daily PRN ClarNoemi ChapelDO       sodium bicarbonate 150 mEq in dextrose 5 % 1,150 mL infusion   Intravenous Continuous ClarJulian Hy 150 mL/hr at 03/02/2021 1257 New Bag at 02/24/2021 1257   vancomycin (VANCOREADY) IVPB 1500 mg/300 mL  1,500 mg Intravenous Once OrieBertis RuddyH 150 mL/hr at 02/25/2021 1230 1,500 mg at 03/04/2021 1230   [START ON 02/14/2021] vancomycin (VANCOREADY) IVPB 750 mg/150 mL  750 mg Intravenous Q M,W,F-HD OrieBertis RuddyH       vasopressin (PITRESSIN) 20 Units in sodium chloride 0.9 % 100 mL infusion-*FOR SHOCK*  0-0.03 Units/min Intravenous Continuous ClarJulian Hy 3 mL/hr at 02/10/2021 1321 0.01 Units/min at 02/17/2021 1321   Current Outpatient Medications  Medication Sig Dispense Refill   acetaminophen (TYLENOL) 325 MG tablet Take 325-650 mg by mouth every 6 (six) hours as needed for mild pain or fever. Temp > 100.5     aspirin EC 325 MG EC tablet Take 1 tablet (325 mg total) by mouth daily. 30 tablet 0   atorvastatin (LIPITOR) 40 MG tablet Take 1 tablet (40 mg total) by mouth daily. 30 tablet 0   bisacodyl (DULCOLAX) 10 MG suppository Place 1 suppository (10 mg total) rectally daily as needed for moderate constipation. 12 suppository 0   calcium  acetate (PHOSLO) 667 MG capsule Take 667 mg by mouth 3 (three) times daily with meals.     cinacalcet (SENSIPAR) 30 MG tablet Take 2 tablets (60 mg total) by mouth daily with breakfast. 60 tablet    Darbepoetin Alfa (ARANESP, ALBUMIN FREE, IJ) Darbepoetin Alfa (Aranesp)     diltiazem (CARDIZEM CD) 120 MG 24 hr capsule Take 1 capsule (120 mg total) by mouth daily.     docusate sodium (COLACE) 100 MG capsule Take 1 capsule (100 mg total) by mouth 2 (two) times daily. 10 capsule 0   methocarbamol (ROBAXIN) 500 MG tablet Take 1 tablet (500 mg total) by mouth 3 (three) times daily.     metoprolol tartrate (LOPRESSOR) 50 MG tablet Take 0.5 tablets (25 mg total) by mouth 2 (two) times daily.     Multiple Vitamin (MULTIVITAMIN WITH MINERALS) TABS tablet Take 1 tablet  by mouth daily.     nitroGLYCERIN (NITROSTAT) 0.4 MG SL tablet Place 0.4 mg under the tongue every 5 (five) minutes x 3 doses as needed for chest pain.      Nutritional Supplements (,FEEDING SUPPLEMENT, PROSOURCE PLUS) liquid Take 30 mLs by mouth 2 (two) times daily between meals.     Nutritional Supplements (FEEDING SUPPLEMENT, NEPRO CARB STEADY,) LIQD Take 237 mLs by mouth 3 (three) times daily between meals.  0   pantoprazole (PROTONIX) 40 MG tablet Take 1 tablet (40 mg total) by mouth daily.     polyethylene glycol (MIRALAX / GLYCOLAX) 17 g packet Take 17 g by mouth daily as needed for mild constipation. 14 each 0   pregabalin (LYRICA) 75 MG capsule Take 1 capsule (75 mg total) by mouth at bedtime. 60 capsule 0   senna-docusate (SENOKOT-S) 8.6-50 MG tablet Take 1 tablet by mouth 2 (two) times daily.     sevelamer carbonate (RENVELA) 800 MG tablet Take 1,600 mg by mouth 3 (three) times daily with meals.     Vancomycin (VANCOCIN) 750-5 MG/150ML-% SOLN Inject 150 mLs (750 mg total) into the vein every Monday, Wednesday, and Friday with hemodialysis. 6 weeks of vancomycin from 5/27 4000 mL    Facility-Administered Medications Ordered in Other  Encounters  Medication Dose Route Frequency Provider Last Rate Last Admin   regadenoson (LEXISCAN) injection SOLN 0.4 mg  0.4 mg Intravenous Once Cherlynn Kaiser A, MD       technetium tetrofosmin (TC-MYOVIEW) injection [id number] millicurie  [id number] millicurie Intravenous Once PRN Elouise Munroe, MD       technetium tetrofosmin (TC-MYOVIEW) injection [id number] millicurie  [id number] millicurie Intravenous Once PRN Elouise Munroe, MD         ALLERGIES Patient has no known allergies.  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   MRSA   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 09/29/2020   Patient needs f/u CT chest 3 months   Chronic kidney disease    patient on dialysis tues-thurs-sat   Cirrhosis of liver (HCC)    Drug addiction ([place])    ETOH abuse    Hepatitis    Hypertension    Mitral regurgitation    Patent foramen ovale 10/23/2019   Renal disorder renal failure   S/P minimally-invasive mitral valve replacement with bioprosthetic valve 10/14/2020   31 mm Medtronic Mosaic stented porcine bioprosthetic tissue valve via right mini thoracotomy approach   Sleep apnea    Systolic and diastolic CHF, 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[place]) 04/19/2013     SOCIAL HISTORY Social History   Socioeconomic History   Marital status: Single    Spouse name: Not on file   Number of children: Not on file   Years of education: Not on file   Highest education level: Not on file  Occupational History   Not on file  Tobacco Use   Smoking status: Former    Packs/day: 0.20    Pack years: 0.00    Types: Cigarettes    Quit date: 10/05/2017    Years since quitting: 3.3   Smokeless tobacco: Never   Tobacco comments:    Quit 3 years ago  Vaping Use   Vaping Use: Never used  Substance and Sexual Activity   Alcohol use: No   Drug use: No    Comment: Quit   Sexual activity: Not Currently    Birth control/protection: Condom  Other Topics Concern   Not on file  Social  History Narrative   ** Merged History Encounter **       Social Determinants of Health   Financial Resource Strain: Not on file  Food Insecurity: Not on file  Transportation Needs: Not on file  Physical Activity: Not on file  Stress: Not on file  Social Connections: Not on file  Intimate Partner Violence: Not on file     FAMILY HISTORY Family History  Problem Relation Age of Onset   Hypertension Mother    Cancer Mother        breast     Review of Systems: 12 systems were reviewed and negative except per HPI  Physical Exam: Vitals:   03/06/2021 1240  1250  BP: (!) 100/37 (!) 103/50  Pulse: 88 86  Resp: (!) 32 (!) 32  Temp:    SpO2:  95%   Total I/O In: 2050 [I.V.:2050] Out: -   Intake/Output Summary (Last 24 hours) at 03/01/2021 1322 Last data filed at 02/16/2021 1254 Gross per 24 hour  Intake 2050 ml  Output --  Net 2050 ml   Limited exam in the context of the COVID-19 pandemic and in efforts to conserve PPE.  Gen: sedated, chronically ill appearing Resp: intubated, bl chest rise  Test Results Reviewed Lab Results  Component Value Date   NA 130 (L)    K 5.7 (H) 02/18/2021   CL 98 02/07/2021   CO2 15 (L) 02/06/2021   BUN 46 (H) 02/19/2021   CREATININE 7.00 (H) 03/04/2021   CALCIUM 7.3 (L)    ALBUMIN 2.2 (L) 02/07/2021   PHOS 3.7 12/29/2020   PHOS 3.7 12/29/2020    I have reviewed relevant outside healthcare records

## 2021-02-13 NOTE — Progress Notes (Signed)
[facility] Progress Note Patient Name: Patrick Anthony DOB: August 30, 1970 MRN: [id number]   Date of Service  02/17/2021  HPI/Events of Note  Pt on D10 @ 100 cc/hr + D5 NaHCO3 @ 150cc/hr. Nephrology would like patient net negative 50cc/hr. RN asks if D10 drip can be cut back now that his CBGs have improved.   eICU Interventions  Reduce D10W to 50 cc/hr now. If CBGs stable for 2 hours (not dropping precipitously) then will discontinue D10W altogether.     Intervention Category Intermediate Interventions: Other:  [name] 02/11/2021, 11:53 PM

## 2021-02-13 NOTE — ED Triage Notes (Signed)
BIB [facility] from [facility], called out for unresponsive, cold, diaphoretic, CBG 20, mentioned at some time hurting all over, EMS gave D10 and brought sugar up to 76, w/OBJECTIVE: much improvement in mental status, ST 1st degree AV block, T wave abnormality, L EJ 18g, poor SPO2 extremities cold, no h/o DM, arrives tachypneic alterred mental status, responds to pain, nonverbal. EDP notified.

## 2021-02-13 NOTE — H&P (Addendum)
NAME:  Patrick Anthony, MRN:  [id number], DOB:  [birth year], LOS: 0 ADMISSION DATE:  02/05/2021, CONSULTATION DATE:  02/18/2021 REFERRING MD:  Patrick Anthony- TRH, CHIEF COMPLAINT:  respiratory failure on vent   History of Present Illness:  Patrick Anthony is a 50 y/o gentleman with a history of endocarditis and diskitis with recent bacteremia, ESRD who presented from [place] with hypoglycemia after he was found unresponsive this morning. He was diaphoretic but did not regain consciousness when given supplemental dextrose. He developed progressive hypoxia and required intubation. He has a history of MRSA aortic endocarditis, diskitis, and bacteremia in February & June 2022, bioprosthetic MVR, HFpEF, chronic PH, ESRD on HD, BKA. He was discharged on vancomycin; last dose unknown. History obtained from chart review due to patient's encephalopathy. Received ceftriaxone and azithromycin in the ED and was started on vasopressors for shock.  Pertinent  Medical History  MRSA aortic endocarditis, diskitis, and bacteremia in June 2022 bioprosthetic MVR, HFpEF, chronic PH, PFI ESRD on HD BKA Cirrhosis OSA  Significant Hospital Events: Including procedures, antibiotic start and stop dates in addition to other pertinent events   7/10 admitted, intubated, on pressors  Interim History / Subjective:    Objective   Blood pressure (!) 71/51, pulse 73, temperature (!) 97 F (36.1 C), temperature source Rectal, resp. rate (!) 25, height '5\' 11"'$  (1.803 m), SpO2 100 %.    Vent Mode: PRVC FiO2 (%):  [60 %-100 %] 60 % Set Rate:  [16 bmp] 16 bmp Vt Set:  [600 mL] 600 mL PEEP:  [5 cmH20] 5 cmH20  No intake or output data in the 24 hours ending 03/02/2021 1114 There were no vitals filed for this visit.  Examination: General: critically ill appearing man intubated, sedated on fentanyl HENT: Patrick Anthony, eyes anicteric Lungs: decreased R lateral breath sounds, CTA on left Cardiovascular: S1S2, tachycardic Abdomen: soft,  NT Extremities: fistula LUE, BKA RLE, cool left foot. Neuro: RASS -5, PERRL, no dolls eyes or corneal reflex. Examined ~1hour after rocuronium. No nonpurposeful movements. Derm: no rashes or ecchymoses  CXR: R opacity, R loculated pleural effusion laterally and inferiorly  K+ 6 Bicarb 15 I cal 0.81> 1.07 after 1g CaCl Trop 236 Covid pending  Resolved Hospital Problem list     Assessment & Plan:   Acute respiratory failure with hypoxia due to R pneumonia-POA R parapneumonic effusion- POA -LTVV, 4-8cc/kg IBW with goal Pplat<30 and DP<15. Not meeting DP goal at this time, but permissive hypercapnia is not possible -VAP prevention protocol -PAD protocol for sedation; fentanyl off now due to hypotension and presentation with being obtunded  Septic shock due to pneumonia-POA -blood & trach aspirate cultures -30cc/kg IBW fluid bolus -empiric vanc, ceftriaxone, and azithromycin -stress dose steroids -pressors to maintain MAP>65; anticipate he will require Aline and central access; daughter declines for now. -if able to collect urine can check antigens for pneumococcus and Legionella -Recommended to daughter to place chest tube for R parapneumonic effusion for source control; she declined for now to ensure he really needs this procedure.  Acute encephalopathy, likely metabolic in origin-POA -head CT to r/o bleeding or other acute cause  -correct electrolytes and hypoglycemia -minimize sedation as able to facilitate ongoing monitoring  Severe metabolic acidosis, presumed lactic acidosis, but level not yet checked -serial lactates -IVF  Hypoglycemia due to sepsis -D10w -hourly accuchecks -stress dose steroids  Hypocalcemia-POA -repletion & monitoring  Hyponatremia, chronic- POA -monitor  ESRD on HD-POA Hyperkalemia due to metabolic acidosis-POA -nephrology consult; anticipate he will  need a dialysis catheter  Elevated troponin likely due to sepsis-POA -serial troponin  levels  Hyperbilirubinemia and elevated transaminase level likely due to sepsis -POA  Chronic anemia due to renal failure-POA -transfuse for Hb<7 or hemodynamically significant bleeding  Acute thrombocytopenia likely due to sepsis -POA -transfuse for platelets <10 or <50 with bleeding -checking DIC panel today; no current signs of bleeding to suggest this diagnosis, but he remains at risk -holding chemical DVT prophylaxis for now   [facility] -I am very concerned with how chronically ill he is. I unfortunately feel that he is likely to have a poor prognosis given poor baseline health, multiple recent admissions, and MRSA infectious that previously were not able to achieve complete source control. He is acutely ill in MOF and may not survive this admission. Palliative Care has been consulted to help with his ongoing care. I updated his daughter today per my previous note.  Best Practice (right click and "Reselect all SmartList Selections" daily)   Diet/type: NPO DVT prophylaxis: not indicated GI prophylaxis: PPI Lines: N/A Foley:  N/A Code Status:  DNR Last date of multidisciplinary goals of care discussion [7/10- daughter Patrick Anthony ]  Labs   CBC: Recent Labs  Lab 02/04/2021 0938 02/24/2021 0948  WBC 4.6  --   NEUTROABS PENDING  --   HGB 10.3* 13.6  12.9*  HCT 36.8* 40.0  38.0*  MCV 95.8  --   PLT PENDING  --     Basic Metabolic Panel: Recent Labs  Lab 02/12/2021 0938 02/12/2021 0948  NA PENDING 131*  132*  K PENDING 5.9*  6.0*  CL PENDING 98  CO2 PENDING  --   GLUCOSE 60* 74  BUN 44* 46*  CREATININE 6.55* 7.00*  CALCIUM 7.3*  --    GFR: CrCl cannot be calculated (Unknown ideal weight.). Recent Labs  Lab 02/10/2021 0938  WBC 4.6    Liver Function Tests: Recent Labs  Lab 02/25/2021 0938  AST 88*  ALT 25  ALKPHOS 86  BILITOT 3.5*  PROT 7.9  ALBUMIN 2.2*   No results for input(s): LIPASE, AMYLASE in the last 168 hours. No results for input(s): AMMONIA in the last  168 hours.  ABG    Component Value Date/Time   PHART 7.393 10/15/2020 0622   PCO2ART 33.4 10/15/2020 0622   PO2ART 72 (L) 10/15/2020 0622   HCO3 17.5 (L) 02/10/2021 0948   TCO2 19 (L) 02/24/2021 0948   TCO2 19 (L) 03/06/2021 0948   ACIDBASEDEF 9.0 (H) 03/04/2021 0948   O2SAT 66.0 02/07/2021 0948     Coagulation Profile: No results for input(s): INR, PROTIME in the last 168 hours.  Cardiac Enzymes: No results for input(s): CKTOTAL, CKMB, CKMBINDEX, TROPONINI in the last 168 hours.  HbA1C: Hgb A1c MFr Bld  Date/Time Value Ref Range Status  09/29/2020 04:35 AM 5.5 4.8 - 5.6 % Final    Comment:    (NOTE) Pre diabetes:          5.7%-6.4%  Diabetes:              >6.4%  Glycemic control for   <7.0% adults with diabetes   09/28/2020 08:27 AM 5.4 4.8 - 5.6 % Final    Comment:    (NOTE) Pre diabetes:          5.7%-6.4%  Diabetes:              >6.4%  Glycemic control for   <7.0% adults with diabetes     CBG:  Recent Labs  Lab 02/07/2021 0925 02/14/2021 1021 03/03/2021 1038 02/06/2021 1110  GLUCAP 78 28* 115* 83    Review of Systems:   Unable to be obtained due to encephalopathy.  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   Surgical History:   Past Surgical History:  Procedure Laterality Date   AMPUTATION Right 10/29/2020   Procedure: RIGHT ABOVE KNEE AMPUTATION;  Surgeon: [name], MD;  Location: [facility];  Service: Orthopedics;  Laterality: Right;   AV FISTULA PLACEMENT Right    AV FISTULA PLACEMENT Left  09/03/2013   Procedure: ARTERIOVENOUS (AV) FISTULA CREATION - LEFT BRACHIOCEPHALIC;  Surgeon: Conrad Emmonak, MD;  Location: [place];  Service: Vascular;  Laterality: Left;   AV FISTULA PLACEMENT Left 10/30/2019   Procedure: INSERTION OF ARTERIOVENOUS (AV) GORE-TEX GRAFT ARM;  Surgeon: Rosetta Posner, MD;  Location: [place];  Service: Vascular;  Laterality: Left;   BUBBLE STUDY  10/05/2020   Procedure: BUBBLE STUDY;  Surgeon: Pixie Casino, MD;  Location: [place];  Service: Cardiovascular;;   I & D EXTREMITY Right 10/20/2020   Procedure: IRRIGATION AND DEBRIDEMENT OF LEG;  Surgeon: [name], MD;  Location: [place];  Service: Orthopedics;  Laterality: Right;   INSERTION OF DIALYSIS CATHETER N/A 08/29/2013   Procedure: INSERTION OF DIALYSIS CATHETER;  Surgeon: Mal Misty, MD;  Location: [place];  Service: Vascular;  Laterality: N/A;   INSERTION OF DIALYSIS CATHETER Left 10/30/2019   Procedure: INSERTION OF DIALYSIS CATHETER LEFT INTERNAL JUGULAR;  Surgeon: Rosetta Posner, MD;  Location: [place];  Service: Vascular;  Laterality: Left;   LEFT HEART CATH AND CORONARY ANGIOGRAPHY N/A 10/07/2020   Procedure: LEFT HEART CATH AND CORONARY ANGIOGRAPHY;  Surgeon: Lorretta Harp, MD;  Location: [place] [facility];  Service: Cardiovascular;  Laterality: N/A;   MITRAL VALVE REPAIR Right 10/14/2020   Procedure: MINIMALLY INVASIVE MITRAL VALVE  REPLACEMENT(MVR) USING MEDTRONIC MOSAIC VALVE SIZE 31MM;  Surgeon: Rexene Alberts, MD;  Location: [place];  Service: Open Heart Surgery;  Laterality: Right;   MULTIPLE EXTRACTIONS WITH ALVEOLOPLASTY N/A 10/12/2020   Procedure: MULTIPLE EXTRACTION WITH ALVEOLOPLASTY;  Surgeon: [name], DMD;  Location: [place];  Service: Dentistry;  Laterality: N/A;   PERIPHERAL VASCULAR CATHETERIZATION N/A 01/06/2015   Procedure: A/V Shuntogram/Fistulagram;  Surgeon: Katha Cabal, MD;  Location: [place] CV [facility];  Service: Cardiovascular;  Laterality: N/A;   PERIPHERAL VASCULAR  CATHETERIZATION Left 01/06/2015   Procedure: A/V Shunt Intervention;  Surgeon: Katha Cabal, MD;  Location: [place] [facility];  Service: Cardiovascular;  Laterality: Left;   REPAIR OF PATENT FORAMEN OVALE N/A 10/14/2020   Procedure: CLOSURE OF PATENT FORAMEN OVALE;  Surgeon: Rexene Alberts, MD;  Location: [place];  Service: Open Heart Surgery;  Laterality: N/A;   TEE WITHOUT CARDIOVERSION N/A 10/05/2020   Procedure: TRANSESOPHAGEAL ECHOCARDIOGRAM (TEE);  Surgeon: Pixie Casino, MD;  Location: [place];  Service: Cardiovascular;  Laterality: N/A;   TEE WITHOUT CARDIOVERSION N/A 10/14/2020   Procedure: TRANSESOPHAGEAL ECHOCARDIOGRAM (TEE);  Surgeon: Rexene Alberts, MD;  Location: [place];  Service: Open Heart Surgery;  Laterality: N/A;   TEE WITHOUT CARDIOVERSION N/A 12/24/2020   Procedure: TRANSESOPHAGEAL ECHOCARDIOGRAM (TEE);  Surgeon: Pixie Casino, MD;  Location: [facility] ENDOSCOPY;  Service: Cardiovascular;  Laterality: N/A;     Social History:   reports that he quit smoking about 3 years ago. His smoking use included cigarettes. He smoked an average of 0.20 packs per day. He has never used smokeless tobacco. He reports that he does not drink alcohol and does not use drugs.   Family History:  His family history includes Cancer in his mother; Hypertension in his mother.   Allergies No Known Allergies   Home Medications  Prior to Admission medications   Medication Sig Start Date End Date Taking? Authorizing Provider  acetaminophen (TYLENOL) 325 MG tablet Take 325-650 mg by mouth every 6 (six) hours as needed for mild pain or fever. Temp > 100.5    [provider]  aspirin EC 325 MG EC tablet Take 1 tablet (325 mg total) by mouth daily. 11/10/20   Samella Parr, NP  atorvastatin (LIPITOR) 40 MG tablet Take 1 tablet (40 mg total) by mouth daily. 01/27/21   Dessa Phi, DO  bisacodyl (DULCOLAX) 10 MG suppository Place 1 suppository (10 mg total) rectally daily as needed for  moderate constipation. 11/09/20   Samella Parr, NP  calcium acetate (PHOSLO) 667 MG capsule Take 667 mg by mouth 3 (three) times daily with meals.    [provider]  cinacalcet (SENSIPAR) 30 MG tablet Take 2 tablets (60 mg total) by mouth daily with breakfast. 11/10/20   Samella Parr, NP  Darbepoetin Alfa (ARANESP, ALBUMIN FREE, IJ) Darbepoetin Alfa (Aranesp) 07/05/20 07/04/21  [provider]  diltiazem (CARDIZEM CD) 120 MG 24 hr capsule Take 1 capsule (120 mg total) by mouth daily. 01/07/21   Ghimire, Henreitta Leber, MD  docusate sodium (COLACE) 100 MG capsule Take 1 capsule (100 mg total) by mouth 2 (two) times daily. 11/09/20   Samella Parr, NP  methocarbamol (ROBAXIN) 500 MG tablet Take 1 tablet (500 mg total) by mouth 3 (three) times daily. 11/09/20   Samella Parr, NP  metoprolol tartrate (LOPRESSOR) 50 MG tablet Take 0.5 tablets (25 mg total) by mouth 2 (two) times daily. 01/06/21   Ghimire, Henreitta Leber, MD  Multiple Vitamin (MULTIVITAMIN WITH MINERALS) TABS tablet Take 1 tablet by mouth daily.    [provider]  nitroGLYCERIN (NITROSTAT) 0.4 MG SL tablet Place 0.4 mg under the tongue every 5 (five) minutes x 3 doses as needed for chest pain.     [provider]  Nutritional Supplements (,FEEDING SUPPLEMENT, PROSOURCE PLUS) liquid Take 30 mLs by mouth 2 (two) times daily between meals. 11/09/20   Samella Parr, NP  Nutritional Supplements (FEEDING SUPPLEMENT, NEPRO CARB STEADY,) LIQD Take 237 mLs by mouth 3 (three) times daily between meals. 11/09/20   Samella Parr, NP  pantoprazole (PROTONIX) 40 MG tablet Take 1 tablet (40 mg total) by mouth daily. 11/10/20   Samella Parr, NP  polyethylene glycol (MIRALAX / GLYCOLAX) 17 g packet Take 17 g by mouth daily as needed for mild constipation. 11/09/20   Samella Parr, NP  pregabalin (LYRICA) 75 MG capsule Take 1 capsule (75 mg total) by mouth at bedtime. 11/09/20   Samella Parr, NP  senna-docusate  (SENOKOT-S) 8.6-50 MG tablet Take 1 tablet by mouth 2 (two) times daily. 11/09/20   Samella Parr, NP  sevelamer carbonate (RENVELA) 800 MG tablet Take  1,600 mg by mouth 3 (three) times daily with meals.    [provider]  Vancomycin (VANCOCIN) 750-5 MG/150ML-% SOLN Inject 150 mLs (750 mg total) into the vein every Monday, Wednesday, and Friday with hemodialysis. 6 weeks of vancomycin from 5/27 12/31/20   Jonetta Osgood, MD     Critical care time: 85 min.     Julian Hy, DO 02/14/2021 12:51 PM [facility]

## 2021-02-13 NOTE — Progress Notes (Signed)
Assist with transport from ED to CT then to [facility]. No noted respiratory issues at this time.

## 2021-02-13 NOTE — Progress Notes (Signed)
[facility] Progress Note Patient Name: Patrick Anthony DOB: July 06, 1971 MRN: [id number]   Date of Service  02/18/2021  HPI/Events of Note  RN requests bilateral soft wrist restraints in this intubated patient who is being started on CRRT.   eICU Interventions  Restraints ordered as above to maintain patient safety.     Intervention Category Minor Interventions: Agitation / anxiety - evaluation and management  Marily Lente Fredricka Kohrs 02/28/2021, 11:22 PM

## 2021-02-13 NOTE — Code Documentation (Signed)
Vital signs stable. 

## 2021-02-13 NOTE — Procedures (Signed)
Central Venous Catheter Insertion Procedure Note  Aurora Steinhaus  [id number]  08-08-1970  Date:02/09/2021  Time:6:13 PM   Provider Performing:Eeshan Verbrugge Naomie Dean   Procedure: Insertion of Non-tunneled Central Venous [phone]) with US guidance [id number])   Indication(s) Hemodialysis  Consent Risks of the procedure as well as the alternatives and risks of each were explained to the patient and/or caregiver.  Consent for the procedure was obtained and is signed in the bedside chart  Anesthesia Topical only with 1% lidocaine   Timeout Verified patient identification, verified procedure, site/side was marked, verified correct patient position, special equipment/implants available, medications/allergies/relevant history reviewed, required imaging and test results available.  Sterile Technique Maximal sterile technique including full sterile barrier drape, hand hygiene, sterile gown, sterile gloves, mask, hair covering, sterile ultrasound probe cover (if used).  Procedure Description Area of catheter insertion was cleaned with chlorhexidine and draped in sterile fashion.  With real-time ultrasound guidance a needle was placed into the right internal jugular vein. Nonpulsatile blood flow was obtained and guidewire inserted to 20cm before stopping. The vein was dilated and the dialysis catheter was attempted to be inserted but stopped after 10cm. The procedure was aborted and the catheter and guidewire were both removed and pressure held. A dressing was applied once bleeding had stopped. Likely stenosis in SVC from previous HD access.   Complications/Tolerance Unsuccessful placement Chest X-ray is ordered to verify no post-procedure complications.    EBL Minimal  Specimen(s) None  Julian Hy, DO 02/04/2021 6:15 PM [facility]

## 2021-02-13 NOTE — Progress Notes (Signed)
[facility] Progress Note Patient Name: Patrick Anthony DOB: 1970/11/13 MRN: [id number]   Date of Service  02/12/2021  HPI/Events of Note  ABG 7.236/<19/100/6.9/BE -19.9  Only marginally worse acidosis than prior. Should be started on CRRT within the next 45 minutes per RN.  Hemodynamically unchanged. RN actually able to wean levophed a bit.   eICU Interventions  1 amp NaHCO3 to temporize while awaiting start of CRRT.      Intervention Category Major Interventions: Acid-Base disturbance - evaluation and management  [name] 02/12/2021, 8:58 PM

## 2021-02-13 NOTE — Procedures (Signed)
Central Venous Catheter Insertion Procedure Note  Dev Bilderback  [id number]  09-21-1970  Date:03/02/2021  Time:6:15 PM   Provider Performing:Nazanin Kinner Naomie Dean   Procedure: Insertion of Non-tunneled Central Venous [phone]) with US guidance [id number])   Indication(s) Hemodialysis  Consent Risks of the procedure as well as the alternatives and risks of each were explained to the patient and/or caregiver.  Consent for the procedure was obtained and is signed in the bedside chart  Anesthesia Topical only with 1% lidocaine   Timeout Verified patient identification, verified procedure, site/side was marked, verified correct patient position, special equipment/implants available, medications/allergies/relevant history reviewed, required imaging and test results available.  Sterile Technique Maximal sterile technique including full sterile barrier drape, hand hygiene, sterile gown, sterile gloves, mask, hair covering, sterile ultrasound probe cover (if used).  Procedure Description Area of catheter insertion was cleaned with chlorhexidine and draped in sterile fashion.  With real-time ultrasound guidance a HD catheter was placed into the left femoral vein. Nonpulsatile blood flow and easy flushing noted in all ports.  The catheter was sutured in place and sterile dressing applied.  Femoral site chosen due to unsuccessful placement in RIJ and concern for stenosis in SVC that would have limited LIJ access as well. R femoral scarring suggesting previous vascular surgeries, possibly dialysis access.  Complications/Tolerance None; patient tolerated the procedure well. Chest X-ray is ordered to verify placement for internal jugular or subclavian cannulation.   Chest x-ray is not ordered for femoral cannulation.  EBL Minimal  Specimen(s) None  Julian Hy, DO 02/09/2021 6:16 PM [facility]

## 2021-02-13 NOTE — ED Notes (Signed)
PCCM at [facility]

## 2021-02-13 NOTE — ED Provider Notes (Signed)
[facility] EMERGENCY DEPARTMENT Provider Note   CSN: [id number] Arrival date & time: 02/21/2021  [id number]     History Chief Complaint  Patient presents with   Hypoglycemia    Patrick Anthony is a 50 y.o. male.  HPI  50 year old male with past medical history of HTN, CKD on hemodialysis, CHF, pleural effusions presents the emergency department concern for altered mental status and hypoglycemia.  Patient is from [place].  The report is that the patient was found unresponsive this morning, fingerstick blood sugar with EMS was in the 20s, he was given dextrose prior to arrival.  On arrival he intermittently responds to sternal rub, is tachypneic, otherwise unresponsive.  Past Medical History:  Diagnosis Date   Bacterial endocarditis 09/27/2020   MRSA   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 09/29/2020   Patient needs f/u CT chest 3 months   Chronic kidney disease    patient on dialysis tues-thurs-sat   Cirrhosis of liver ([place])    Drug addiction ([place])    ETOH abuse    Hepatitis    Hypertension    Mitral regurgitation    Patent foramen ovale 10/23/2019   Renal disorder renal failure   S/P minimally-invasive mitral valve replacement with bioprosthetic valve 10/14/2020   31 mm Medtronic Mosaic stented porcine bioprosthetic tissue valve via right mini thoracotomy approach   Sleep apnea    Systolic and diastolic CHF, 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[place]) 04/19/2013    Patient Active Problem List   Diagnosis Date Noted   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   Chronic pain disorder 01/23/2021   Acute bacterial endocarditis 01/21/2021   SIRS (systemic inflammatory response syndrome) ([place]) 12/22/2020   Moderate protein-calorie malnutrition ([place]) 11/11/2020   Other acute osteomyelitis, right tibia and fibula ([place]) 11/11/2020   S/P AKA (above knee amputation), right (HCC)    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)    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)    Abscess of right leg    S/P mitral valve replacement    S/P mitral valve repair    S/P minimally-invasive mitral valve replacement with bioprosthetic valve 10/14/2020   Dental caries    Chronic apical periodontitis    Chronic periodontitis    Accretions on teeth    Mitral regurgitation    Uncontrolled hypertension    Patent foramen ovale    Cerebral embolism with cerebral infarction 09/29/2020   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 09/29/2020   Bacteremia due to Staphylococcus aureus    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   Lactic acidosis 09/27/2020   Bacterial endocarditis 09/27/2020   Allergy, unspecified, subsequent encounter 04/15/2020   Anaphylactic shock, unspecified, subsequent encounter 0000000   Complication of vascular dialysis catheter 10/31/2019   Kidney disease 02/04/2019   Personal history of Methicillin resistant Staphylococcus aureus infection 09/11/2018   Encounter for immunization 09/07/2017   Dependence on renal dialysis ([place]) 03/07/2017   Iron deficiency anemia, unspecified 11/15/2015   Coagulation defect, unspecified ([place]) 10/08/2015   Gastrointestinal hemorrhage, unspecified 10/08/2015   Pain, unspecified 10/08/2015   Secondary hyperparathyroidism of renal origin ([place]) 10/08/2015   End stage renal disease ([place]) 10/17/2013   Anemia in chronic kidney disease 08/30/2013   Hepatitis C 08/30/2013   ESRD on dialysis ([place]) 08/30/2013   Hypertensive crisis 08/28/2013   Hypertensive emergency 04/20/2013   Chronic combined systolic and diastolic  congestive heart failure ([place]) 04/19/2013   HTN (hypertension) 04/19/2013    Past Surgical History:  Procedure Laterality Date   AMPUTATION Right 10/29/2020   Procedure: RIGHT ABOVE KNEE AMPUTATION;  Surgeon: [name], MD;  Location: [place];  Service: Orthopedics;  Laterality: Right;    AV FISTULA PLACEMENT Right    AV FISTULA PLACEMENT Left 09/03/2013   Procedure: ARTERIOVENOUS (AV) FISTULA CREATION - LEFT BRACHIOCEPHALIC;  Surgeon: Conrad Boley, MD;  Location: [place];  Service: Vascular;  Laterality: Left;   AV FISTULA PLACEMENT Left 10/30/2019   Procedure: INSERTION OF ARTERIOVENOUS (AV) GORE-TEX GRAFT ARM;  Surgeon: Rosetta Posner, MD;  Location: [place];  Service: Vascular;  Laterality: Left;   BUBBLE STUDY  10/05/2020   Procedure: BUBBLE STUDY;  Surgeon: Pixie Casino, MD;  Location: [place];  Service: Cardiovascular;;   I & D EXTREMITY Right 10/20/2020   Procedure: IRRIGATION AND DEBRIDEMENT OF LEG;  Surgeon: [name], MD;  Location: [place];  Service: Orthopedics;  Laterality: Right;   INSERTION OF DIALYSIS CATHETER N/A 08/29/2013   Procedure: INSERTION OF DIALYSIS CATHETER;  Surgeon: Mal Misty, MD;  Location: [place];  Service: Vascular;  Laterality: N/A;   INSERTION OF DIALYSIS CATHETER Left 10/30/2019   Procedure: INSERTION OF DIALYSIS CATHETER LEFT INTERNAL JUGULAR;  Surgeon: Rosetta Posner, MD;  Location: [place];  Service: Vascular;  Laterality: Left;   LEFT HEART CATH AND CORONARY ANGIOGRAPHY N/A 10/07/2020   Procedure: LEFT HEART CATH AND CORONARY ANGIOGRAPHY;  Surgeon: Lorretta Harp, MD;  Location: [place] [facility];  Service: Cardiovascular;  Laterality: N/A;   MITRAL VALVE REPAIR Right 10/14/2020   Procedure: MINIMALLY INVASIVE MITRAL VALVE  REPLACEMENT(MVR) USING MEDTRONIC MOSAIC VALVE SIZE 31MM;  Surgeon: Rexene Alberts, MD;  Location: [place];  Service: Open Heart Surgery;  Laterality: Right;   MULTIPLE EXTRACTIONS WITH ALVEOLOPLASTY N/A 10/12/2020   Procedure: MULTIPLE EXTRACTION WITH ALVEOLOPLASTY;  Surgeon: [name], DMD;  Location: [place];  Service: Dentistry;  Laterality: N/A;   PERIPHERAL VASCULAR CATHETERIZATION N/A 01/06/2015   Procedure: A/V Shuntogram/Fistulagram;  Surgeon: Katha Cabal, MD;  Location: [place] [facility];  Service:  Cardiovascular;  Laterality: N/A;   PERIPHERAL VASCULAR CATHETERIZATION Left 01/06/2015   Procedure: A/V Shunt Intervention;  Surgeon: Katha Cabal, MD;  Location: [place] [facility];  Service: Cardiovascular;  Laterality: Left;   REPAIR OF PATENT FORAMEN OVALE N/A 10/14/2020   Procedure: CLOSURE OF PATENT FORAMEN OVALE;  Surgeon: Rexene Alberts, MD;  Location: [place];  Service: Open Heart Surgery;  Laterality: N/A;   TEE WITHOUT CARDIOVERSION N/A 10/05/2020   Procedure: TRANSESOPHAGEAL ECHOCARDIOGRAM (TEE);  Surgeon: Pixie Casino, MD;  Location: [place];  Service: Cardiovascular;  Laterality: N/A;   TEE WITHOUT CARDIOVERSION N/A 10/14/2020   Procedure: TRANSESOPHAGEAL ECHOCARDIOGRAM (TEE);  Surgeon: Rexene Alberts, MD;  Location: [place];  Service: Open Heart Surgery;  Laterality: N/A;   TEE WITHOUT CARDIOVERSION N/A 12/24/2020   Procedure: TRANSESOPHAGEAL ECHOCARDIOGRAM (TEE);  Surgeon: Pixie Casino, MD;  Location: [facility] END[facility];  Service: Cardiovascular;  Laterality: N/A;       Family History  Problem Relation Age of Onset   Hypertension Mother    Cancer Mother        breast    Social History   Tobacco Use   Smoking status: Former    Packs/day: 0.20    Pack years: 0.00    Types: Cigarettes  Quit date: 10/05/2017    Years since quitting: 3.3   Smokeless tobacco: Never   Tobacco comments:    Quit 3 years ago  Vaping Use   Vaping Use: Never used  Substance Use Topics   Alcohol use: No   Drug use: No    Comment: Quit    Home Medications Prior to Admission medications   Medication Sig Start Date End Date Taking? Authorizing Provider  acetaminophen (TYLENOL) 325 MG tablet Take 325-650 mg by mouth every 6 (six) hours as needed for mild pain or fever. Temp > 100.5    [provider]  aspirin EC 325 MG EC tablet Take 1 tablet (325 mg total) by mouth daily. 11/10/20   Samella Parr, NP  atorvastatin (LIPITOR) 40 MG tablet Take 1 tablet (40 mg total) by  mouth daily. 01/27/21   Dessa Phi, DO  bisacodyl (DULCOLAX) 10 MG suppository Place 1 suppository (10 mg total) rectally daily as needed for moderate constipation. 11/09/20   Samella Parr, NP  calcium acetate (PHOSLO) 667 MG capsule Take 667 mg by mouth 3 (three) times daily with meals.    [provider]  cinacalcet (SENSIPAR) 30 MG tablet Take 2 tablets (60 mg total) by mouth daily with breakfast. 11/10/20   Samella Parr, NP  Darbepoetin Alfa (ARANESP, ALBUMIN FREE, IJ) Darbepoetin Alfa (Aranesp) 07/05/20 07/04/21  [provider]  diltiazem (CARDIZEM CD) 120 MG 24 hr capsule Take 1 capsule (120 mg total) by mouth daily. 01/07/21   Ghimire, Henreitta Leber, MD  docusate sodium (COLACE) 100 MG capsule Take 1 capsule (100 mg total) by mouth 2 (two) times daily. 11/09/20   Samella Parr, NP  methocarbamol (ROBAXIN) 500 MG tablet Take 1 tablet (500 mg total) by mouth 3 (three) times daily. 11/09/20   Samella Parr, NP  metoprolol tartrate (LOPRESSOR) 50 MG tablet Take 0.5 tablets (25 mg total) by mouth 2 (two) times daily. 01/06/21   Ghimire, Henreitta Leber, MD  Multiple Vitamin (MULTIVITAMIN WITH MINERALS) TABS tablet Take 1 tablet by mouth daily.    [provider]  nitroGLYCERIN (NITROSTAT) 0.4 MG SL tablet Place 0.4 mg under the tongue every 5 (five) minutes x 3 doses as needed for chest pain.     [provider]  Nutritional Supplements (,FEEDING SUPPLEMENT, PROSOURCE PLUS) liquid Take 30 mLs by mouth 2 (two) times daily between meals. 11/09/20   Samella Parr, NP  Nutritional Supplements (FEEDING SUPPLEMENT, NEPRO CARB STEADY,) LIQD Take 237 mLs by mouth 3 (three) times daily between meals. 11/09/20   Samella Parr, NP  pantoprazole (PROTONIX) 40 MG tablet Take 1 tablet (40 mg total) by mouth daily. 11/10/20   Samella Parr, NP  polyethylene glycol (MIRALAX / GLYCOLAX) 17 g packet Take 17 g by mouth daily as needed for mild constipation. 11/09/20   Samella Parr,  NP  pregabalin (LYRICA) 75 MG capsule Take 1 capsule (75 mg total) by mouth at bedtime. 11/09/20   Samella Parr, NP  senna-docusate (SENOKOT-S) 8.6-50 MG tablet Take 1 tablet by mouth 2 (two) times daily. 11/09/20   Samella Parr, NP  sevelamer carbonate (RENVELA) 800 MG tablet Take 1,600 mg by mouth 3 (three) times daily with meals.    [provider]  Vancomycin (VANCOCIN) 750-5 MG/150ML-% SOLN Inject 150 mLs (750 mg total) into the vein every Monday, Wednesday, and Friday with hemodialysis. 6 weeks of vancomycin from 5/27 12/31/20   Ghimire, [employer],  MD    Allergies    Patient has no known allergies.  Review of Systems   Review of Systems  Unable to perform ROS: Intubated   Physical Exam Updated Vital Signs BP 107/74 (BP Location: Right Arm)   Pulse 85   Temp (!) 97 F (36.1 C) (Rectal)   Resp 17   Ht '5\' 11"'$  (1.803 m)   SpO2 98%   BMI 19.83 kg/m   Physical Exam Vitals and nursing note reviewed.  Constitutional:      Comments: Intubated and sedated  HENT:     Head: Normocephalic.     Mouth/Throat:     Mouth: Mucous membranes are dry.  Eyes:     Pupils: Pupils are equal, round, and reactive to light.  Cardiovascular:     Rate and Rhythm: Normal rate.  Pulmonary:     Comments: On ventilator Abdominal:     Palpations: Abdomen is soft.     Tenderness: There is no abdominal tenderness.  Musculoskeletal:     Comments: R AKA  Skin:    General: Skin is warm.  Neurological:     Comments: Sedated    ED Results / Procedures / Treatments   Labs (all labs ordered are listed, but only abnormal results are displayed) Labs Reviewed  I-STAT CHEM 8, ED - Abnormal; Notable for the following components:      Result Value   Sodium 131 (*)    Potassium 5.9 (*)    BUN 46 (*)    Creatinine, Ser 7.00 (*)    Calcium, Ion 0.81 (*)    TCO2 19 (*)    All other components within normal limits  I-STAT VENOUS BLOOD GAS, ED - Abnormal; Notable for the following  components:   pCO2, Ven 38.6 (*)    Bicarbonate 17.5 (*)    TCO2 19 (*)    Acid-base deficit 9.0 (*)    Sodium 132 (*)    Potassium 6.0 (*)    Calcium, Ion 0.81 (*)    HCT 38.0 (*)    Hemoglobin 12.9 (*)    All other components within normal limits  CBG MONITORING, ED - Abnormal; Notable for the following components:   Glucose-Capillary 28 (*)    All other components within normal limits  CBG MONITORING, ED - Abnormal; Notable for the following components:   Glucose-Capillary 115 (*)    All other components within normal limits  CBC WITH DIFFERENTIAL/PLATELET  COMPREHENSIVE METABOLIC PANEL  LACTIC ACID, PLASMA  LACTIC ACID, PLASMA  BLOOD GAS, ARTERIAL  CBG MONITORING, ED  TROPONIN I (HIGH SENSITIVITY)    EKG None  Radiology DG Abdomen 1 View  Result Date: 02/06/2021 CLINICAL DATA:  Orogastric tube placement. EXAM: ABDOMEN - 1 VIEW COMPARISON:  Chest, abdomen and pelvis CT dated 09/29/2020. FINDINGS: Orogastric 2 tip in the proximal to mid stomach and side hole in the proximal stomach. Normal bowel gas pattern. Mildly prominent stool in the rectum and elsewhere in the colon. Stable coarse oval calcification in the left kidney. Lower thoracic spine degenerative changes. IMPRESSION: 1. Orogastric tube tip and side hole in the stomach. 2. No acute abnormality. Electronically Signed   By: Claudie Revering M.D.   On: 02/18/2021 10:31   DG Chest Portable 1 View  Result Date: 02/27/2021 CLINICAL DATA:  Intubated. EXAM: PORTABLE CHEST 1 VIEW COMPARISON:  Earlier today. FINDINGS: Interval endotracheal tube in satisfactory position. Interval nasogastric tube extending into the stomach. Stable bilateral pleural effusions, right lung airspace opacity and  left basilar linear atelectasis. Stable enlarged cardiac silhouette. Stable left subclavian stent. Stable right shoulder calcific density. IMPRESSION: 1. Stable right pleural effusion and possible pneumonia. 2. Stable small left pleural effusion  and left basilar linear atelectasis. 3. Stable cardiomegaly. Electronically Signed   By: Claudie Revering M.D.   On: 02/25/2021 10:29   DG Chest Portable 1 View  Result Date:  CLINICAL DATA:  Respiratory distress. EXAM: PORTABLE CHEST 1 VIEW COMPARISON:  Chest x-rays dated 01/23/2021 and 12/22/2020. Chest CT angiogram dated 01/23/2021. FINDINGS: Stable cardiomegaly. Overall cardiomediastinal silhouette is grossly stable in size and configuration. Patchy airspace opacities within the RIGHT lung, corresponding to the partially loculated pleural effusions demonstrated on earlier chest CT, small to moderate in size. Cannot exclude superimposed pneumonia within the RIGHT mid lung region. Stable mild atelectasis and small pleural effusion at the LEFT lung base. No new lung findings. No pneumothorax is seen. IMPRESSION: 1. Patchy airspace opacities within the RIGHT lung, presumably corresponding to the partially loculated pleural effusions demonstrated on earlier chest CT, small to moderate in size. However, cannot exclude superimposed pneumonia within the RIGHT mid lung region. 2. Cardiomegaly. Electronically Signed   By: Franki Cabot M.D.   On: 02/09/2021 10:12    Procedures .Critical Care  Date/Time: 02/21/2021 12:26 PM Performed by: Lorelle Gibbs, DO Authorized by: Lorelle Gibbs, DO   Critical care provider statement:    Critical care time (minutes):  60   Critical care was necessary to treat or prevent imminent or life-threatening deterioration of the following conditions:  Circulatory failure, endocrine crisis and shock   Critical care was time spent personally by me on the following activities:  Discussions with consultants, evaluation of patient's response to treatment, examination of patient, ordering and performing treatments and interventions, ordering and review of laboratory studies, ordering and review of radiographic studies, pulse oximetry, re-evaluation of patient's condition,  obtaining history from patient or surrogate and review of old charts   I assumed direction of critical care for this patient from another provider in my specialty: no     Care discussed with: admitting provider   Procedure Name: Intubation Date/Time: 03/05/2021 12:26 PM Performed by: Lorelle Gibbs, DO Pre-anesthesia Checklist: Patient identified, Patient being monitored, Emergency Drugs available, Timeout performed and Suction available Oxygen Delivery Method: Non-rebreather mask Preoxygenation: Pre-oxygenation with 100% oxygen Induction Type: Rapid sequence Ventilation: Mask ventilation without difficulty Laryngoscope Size: Glidescope and 4 Grade View: Grade III Tube size: 7.5 mm Number of attempts: 1 Placement Confirmation: ETT inserted through vocal cords under direct vision, CO2 detector and Breath sounds checked- equal and bilateral Secured at: 26 cm Tube secured with: ETT holder      Medications Ordered in ED Medications  fentaNYL (SUBLIMAZE) injection 50 mcg (has no administration in time range)  fentaNYL 2585mg in NS 2543m(1086mml) infusion-PREMIX (has no administration in time range)  fentaNYL (SUBLIMAZE) bolus via infusion 50-100 mcg (150 mcg Intravenous Bolus from Bag 02/27/2021 1009)  cefTRIAXone (ROCEPHIN) 1 g in sodium chloride 0.9 % 100 mL IVPB (has no administration in time range)  azithromycin (ZITHROMAX) 500 mg in sodium chloride 0.9 % 250 mL IVPB (has no administration in time range)  dextrose 10 % infusion (has no administration in time range)  etomidate (AMIDATE) injection (20 mg Intravenous Given 03/03/2021 1001)  rocuronium (ZEMURON) injection (80 mg Intravenous Given 02/10/2021 1001)  dextrose 50 % solution 25 g (25 g Intravenous Given 03/04/2021 1026)    ED Course  I have reviewed  the triage vital signs and the nursing notes.  Pertinent labs & imaging results that were available during my care of the patient were reviewed by me and considered in my medical  decision making (see chart for details).    MDM Rules/Calculators/A&P                          50 year old male presents to the emergency department minimally responsive, tachypneic and ill-appearing.  Reports of hypoglycemia prior to arrival, dextrose given.  Repeat fingerstick here was appropriate.  Patient is minimally responsive to sternal rub, intubated on arrival for airway protection.  VBG shows no significant acidosis or hypercarbia.  White cells are normal.  On repeat fingerstick patient is hypoglycemic again and now has become hypotensive.  IV fluids are running, patient given more dextrose.  On the chest x-ray he has infiltrate in the right lung.  This appears progressed from previous chest x-ray, he will be covered for community-acquired pneumonia.  No signs of sepsis at this time.  Of note it appears the patient has been admitted with cavitary pneumonia and endocarditis in the past, may need to escalate antibiotics.  Spoke to critical care who will evaluate and admit the patient.  Patients evaluation and results requires admission for further treatment and care. Patient agrees with admission plan, offers no new complaints and is stable/unchanged at time of admit.  Final Clinical Impression(s) / ED Diagnoses Final diagnoses:  None    Rx / DC Orders ED Discharge Orders     None        Lorelle Gibbs, DO 03/05/2021 1232

## 2021-02-13 NOTE — ED Notes (Signed)
Good color change. Equal breath sounds noted.  26 at the lip

## 2021-02-13 NOTE — Progress Notes (Signed)
I spoke to daughter [name] to let her know that her father is in the hospital and is critically ill with MOF and septic shock. She understands that he may not survive this illness and my recommendation for full aggressive care at this time but DNR if he has a cardiac arrest. RN updated at bedside, code status updated in epic.   I discussed my recommendation for temporary dialysis catheter placement, A-line, and R chest tube for parapneumonic effusion. She would prefer to wait on these at this time. We will keep her updated.  Julian Hy, DO 03/03/2021 12:17 PM [facility]

## 2021-02-13 NOTE — ED Notes (Signed)
Wille Glaser C.Rn made aware of CBG

## 2021-02-13 NOTE — Progress Notes (Signed)
Pharmacy Antibiotic Note  Danen Nicanor is a 50 y.o. 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  Pharmacy has been consulted for Vancomycin dosing.  ESRD-HD usually MWF  Plan: Vancomycin 1500 mg IV x 1, then 750 mg  IV q HD Add MRSA PCR Monitor HD schedule, Cx/PCR and clinical progression to narrow Vancomycin random level as needed  Height: '5\' 11"'$  (180.3 cm) IBW/kg (Calculated) : 75.3  Temp (24hrs), Avg:97 F (36.1 C), Min:97 F (36.1 C), Max:97 F (36.1 C)  Recent Labs  Lab 02/18/2021 0938 02/25/2021 0948  WBC 4.6  --   CREATININE 6.55* 7.00*    CrCl cannot be calculated (Unknown ideal weight.).    No Known Allergies  Bertis Ruddy, PharmD Clinical Pharmacist ED Pharmacist Phone # [phone] 03/04/2021 11:21 AM

## 2021-02-13 NOTE — ED Notes (Signed)
Critical lab results given to Dr Dina Rich

## 2021-02-13 NOTE — Progress Notes (Signed)
Discussed his care with his daughter again. At this time I think dialysis will be essential, and she verbally consented over the phone for a dialysis catheter to be placed. She was updated about his covid + status.  Julian Hy, DO 02/09/2021 4:19 PM [facility]

## 2021-02-14 DIAGNOSIS — N186 End stage renal disease: Secondary | ICD-10-CM | POA: Diagnosis not present

## 2021-02-14 DIAGNOSIS — J9601 Acute respiratory failure with hypoxia: Secondary | ICD-10-CM | POA: Diagnosis not present

## 2021-02-14 DIAGNOSIS — Z515 Encounter for palliative care: Secondary | ICD-10-CM | POA: Diagnosis not present

## 2021-02-14 DIAGNOSIS — Z992 Dependence on renal dialysis: Secondary | ICD-10-CM

## 2021-02-14 DIAGNOSIS — R4189 Other symptoms and signs involving cognitive functions and awareness: Secondary | ICD-10-CM | POA: Diagnosis not present

## 2021-02-14 LAB — POCT I-STAT 7, (LYTES, BLD GAS, ICA,H+H)
Acid-Base Excess: 5 mmol/L — ABNORMAL HIGH (ref 0.0–2.0)
Acid-Base Excess: 4 mmol/L — ABNORMAL HIGH (ref 0.0–2.0)
Bicarbonate: 23.3 mmol/L (ref 20.0–28.0)
Bicarbonate: 25.5 mmol/L (ref 20.0–28.0)
Calcium, Ion: 0.87 mmol/L — CL (ref 1.15–1.40)
Calcium, Ion: 0.91 mmol/L — ABNORMAL LOW (ref 1.15–1.40)
HCT: 34 % — ABNORMAL LOW (ref 39.0–52.0)
HCT: 36 % — ABNORMAL LOW (ref 39.0–52.0)
Hemoglobin: 11.6 g/dL — ABNORMAL LOW (ref 13.0–17.0)
Hemoglobin: 12.2 g/dL — ABNORMAL LOW (ref 13.0–17.0)
O2 Saturation: 100 %
O2 Saturation: 99 %
Patient temperature: 97.5
Patient temperature: 97.5
Potassium: 3.5 mmol/L (ref 3.5–5.1)
Potassium: 3.7 mmol/L (ref 3.5–5.1)
Sodium: 132 mmol/L — ABNORMAL LOW (ref 135–145)
Sodium: 134 mmol/L — ABNORMAL LOW (ref 135–145)
TCO2: 24 mmol/L (ref 22–32)
TCO2: 26 mmol/L (ref 22–32)
pCO2 arterial: 17.4 mmHg — CL (ref 32.0–48.0)
pCO2 arterial: 27.3 mmHg — ABNORMAL LOW (ref 32.0–48.0)
pH, Arterial: 7.732 (ref 7.350–7.450)
pH, Arterial: 7.576 — ABNORMAL HIGH (ref 7.350–7.450)
pO2, Arterial: 141 mmHg — ABNORMAL HIGH (ref 83.0–108.0)
pO2, Arterial: 112 mmHg — ABNORMAL HIGH (ref 83.0–108.0)

## 2021-02-14 LAB — GLUCOSE, CAPILLARY
Glucose-Capillary: 102 mg/dL — ABNORMAL HIGH (ref 70–99)
Glucose-Capillary: 165 mg/dL — ABNORMAL HIGH (ref 70–99)
Glucose-Capillary: 175 mg/dL — ABNORMAL HIGH (ref 70–99)
Glucose-Capillary: 277 mg/dL — ABNORMAL HIGH (ref 70–99)
Glucose-Capillary: 89 mg/dL (ref 70–99)
Glucose-Capillary: 92 mg/dL (ref 70–99)
Glucose-Capillary: 94 mg/dL (ref 70–99)

## 2021-02-14 LAB — POCT I-STAT EG7
Acid-base deficit: 5 mmol/L — ABNORMAL HIGH (ref 0.0–2.0)
Bicarbonate: 20.6 mmol/L (ref 20.0–28.0)
Calcium, Ion: 0.92 mmol/L — ABNORMAL LOW (ref 1.15–1.40)
HCT: 37 % — ABNORMAL LOW (ref 39.0–52.0)
Hemoglobin: 12.6 g/dL — ABNORMAL LOW (ref 13.0–17.0)
O2 Saturation: 26 %
Patient temperature: 36.5
Potassium: 4.2 mmol/L (ref 3.5–5.1)
Sodium: 132 mmol/L — ABNORMAL LOW (ref 135–145)
TCO2: 22 mmol/L (ref 22–32)
pCO2, Ven: 38.7 mmHg — ABNORMAL LOW (ref 44.0–60.0)
pH, Ven: 7.332 (ref 7.250–7.430)
pO2, Ven: 18 mmHg — CL (ref 32.0–45.0)

## 2021-02-14 LAB — RENAL FUNCTION PANEL
Albumin: 1.9 g/dL — ABNORMAL LOW (ref 3.5–5.0)
Albumin: 1.9 g/dL — ABNORMAL LOW (ref 3.5–5.0)
Anion gap: 26 — ABNORMAL HIGH (ref 5–15)
Anion gap: 14 (ref 5–15)
BUN: 35 mg/dL — ABNORMAL HIGH (ref 6–20)
BUN: 25 mg/dL — ABNORMAL HIGH (ref 6–20)
CO2: 16 mmol/L — ABNORMAL LOW (ref 22–32)
CO2: 21 mmol/L — ABNORMAL LOW (ref 22–32)
Calcium: 7.6 mg/dL — ABNORMAL LOW (ref 8.9–10.3)
Calcium: 7.2 mg/dL — ABNORMAL LOW (ref 8.9–10.3)
Chloride: 90 mmol/L — ABNORMAL LOW (ref 98–111)
Chloride: 97 mmol/L — ABNORMAL LOW (ref 98–111)
Creatinine, Ser: 5.19 mg/dL — ABNORMAL HIGH (ref 0.61–1.24)
Creatinine, Ser: 3.19 mg/dL — ABNORMAL HIGH (ref 0.61–1.24)
GFR, Estimated: 13 mL/min — ABNORMAL LOW (ref >60)
GFR, Estimated: 23 mL/min — ABNORMAL LOW (ref >60)
Glucose, Bld: 205 mg/dL — ABNORMAL HIGH (ref 70–99)
Glucose, Bld: 87 mg/dL (ref 70–99)
Phosphorus: 5.4 mg/dL — ABNORMAL HIGH (ref 2.5–4.6)
Phosphorus: 4.4 mg/dL (ref 2.5–4.6)
Potassium: 4.5 mmol/L (ref 3.5–5.1)
Potassium: 4.3 mmol/L (ref 3.5–5.1)
Sodium: 132 mmol/L — ABNORMAL LOW (ref 135–145)
Sodium: 132 mmol/L — ABNORMAL LOW (ref 135–145)

## 2021-02-14 LAB — CBC
HCT: 35.9 % — ABNORMAL LOW (ref 39.0–52.0)
Hemoglobin: 10.5 g/dL — ABNORMAL LOW (ref 13.0–17.0)
MCH: 26.8 pg (ref 26.0–34.0)
MCHC: 29.2 g/dL — ABNORMAL LOW (ref 30.0–36.0)
MCV: 91.6 fL (ref 80.0–100.0)
Platelets: 65 10*3/uL — ABNORMAL LOW (ref 150–400)
RBC: 3.92 MIL/uL — ABNORMAL LOW (ref 4.22–5.81)
RDW: 23.5 % — ABNORMAL HIGH (ref 11.5–15.5)
WBC: 14.1 10*3/uL — ABNORMAL HIGH (ref 4.0–10.5)
nRBC: 0.2 % (ref 0.0–0.2)

## 2021-02-14 LAB — LACTIC ACID, PLASMA: Lactic Acid, Venous: >11 mmol/L (ref 0.5–1.9)

## 2021-02-14 LAB — VANCOMYCIN, RANDOM: Vancomycin Rm: 26

## 2021-02-14 LAB — MAGNESIUM: Magnesium: 1.9 mg/dL (ref 1.7–2.4)

## 2021-02-14 MED ORDER — LIDOCAINE HCL (PF) 1 % IJ SOLN
INTRAMUSCULAR | Status: AC
  Filled 2021-02-14: qty 5

## 2021-02-14 MED ORDER — MIDAZOLAM HCL 2 MG/2ML IJ SOLN
4.0000 mg | Freq: Once | INTRAMUSCULAR | Status: AC
  Administered 2021-02-14: 4 mg via INTRAVENOUS

## 2021-02-14 MED ORDER — MAGNESIUM SULFATE 2 GM/50ML IV SOLN
2.0000 g | Freq: Once | INTRAVENOUS | Status: AC
  Administered 2021-02-14: 2 g via INTRAVENOUS
  Filled 2021-02-14: qty 50

## 2021-02-14 MED ORDER — PRISMASOL BGK 4/2.5 32-4-2.5 MEQ/L EC SOLN
Status: DC
  Filled 2021-02-14 (×15): qty 5000

## 2021-02-14 MED ORDER — CHLORHEXIDINE GLUCONATE 0.12% ORAL RINSE (MEDLINE KIT)
15.0000 mL | Freq: Two times a day (BID) | OROMUCOSAL | Status: DC
  Administered 2021-02-14 – 2021-02-15 (×4): 15 mL via OROMUCOSAL

## 2021-02-14 MED ORDER — MIDAZOLAM HCL 2 MG/2ML IJ SOLN: 2.0000 mg | INTRAMUSCULAR | Status: DC | PRN

## 2021-02-14 MED ORDER — B COMPLEX-C PO TABS
1.0000 | ORAL_TABLET | Freq: Every day | ORAL | Status: DC
  Administered 2021-02-17 – 2021-02-19 (×3): 1
  Filled 2021-02-14 (×3): qty 1

## 2021-02-14 MED ORDER — PRISMASOL BGK 4/2.5 32-4-2.5 MEQ/L REPLACEMENT SOLN
Status: DC
  Filled 2021-02-14 (×5): qty 5000

## 2021-02-14 MED ORDER — PRISMASOL BGK 4/2.5 32-4-2.5 MEQ/L REPLACEMENT SOLN
Status: DC
  Filled 2021-02-14 (×3): qty 5000

## 2021-02-14 MED ORDER — MIDAZOLAM HCL 2 MG/2ML IJ SOLN
INTRAMUSCULAR | Status: AC
  Filled 2021-02-14: qty 4

## 2021-02-14 MED ORDER — PIVOT 1.5 CAL PO LIQD
1000.0000 mL | ORAL | Status: DC
  Filled 2021-02-14 (×2): qty 1000

## 2021-02-14 MED ORDER — PROSOURCE TF PO LIQD: 45.0000 mL | Freq: Two times a day (BID) | ORAL | Status: DC

## 2021-02-14 MED ORDER — VANCOMYCIN HCL IN DEXTROSE 1-5 GM/200ML-% IV SOLN
1000.0000 mg | INTRAVENOUS | Status: AC
  Administered 2021-02-14 – 2021-02-18 (×5): 1000 mg via INTRAVENOUS
  Filled 2021-02-14 (×5): qty 200

## 2021-02-14 MED ORDER — INSULIN ASPART 100 UNIT/ML IJ SOLN
0.0000 [IU] | INTRAMUSCULAR | Status: DC
  Administered 2021-02-16 (×2): 1 [IU] via SUBCUTANEOUS
  Administered 2021-02-17 – 2021-02-18 (×7): 2 [IU] via SUBCUTANEOUS
  Administered 2021-02-19 (×2): 1 [IU] via SUBCUTANEOUS
  Administered 2021-02-19: 2 [IU] via SUBCUTANEOUS
  Administered 2021-02-19: 1 [IU] via SUBCUTANEOUS
  Administered 2021-02-19: 2 [IU] via SUBCUTANEOUS

## 2021-02-14 MED ORDER — HYDROCORTISONE NA SUCCINATE PF 100 MG IJ SOLR
50.0000 mg | Freq: Four times a day (QID) | INTRAMUSCULAR | Status: DC
  Administered 2021-02-14 – 2021-02-16 (×8): 50 mg via INTRAVENOUS
  Filled 2021-02-14 (×8): qty 2

## 2021-02-14 MED ORDER — VITAL HIGH PROTEIN PO LIQD
1000.0000 mL | ORAL | Status: DC
  Administered 2021-02-14: 1000 mL

## 2021-02-14 MED ORDER — "THROMBI-PAD 3""X3"" EX PADS"
1.0000 | MEDICATED_PAD | Freq: Once | CUTANEOUS | Status: AC
  Administered 2021-02-14: 1 via TOPICAL
  Filled 2021-02-14: qty 1

## 2021-02-14 MED ORDER — ORAL CARE MOUTH RINSE
15.0000 mL | OROMUCOSAL | Status: DC
  Administered 2021-02-14 – 2021-02-16 (×19): 15 mL via OROMUCOSAL

## 2021-02-14 MED ORDER — CHLORHEXIDINE GLUCONATE CLOTH 2 % EX PADS
6.0000 | MEDICATED_PAD | Freq: Every day | CUTANEOUS | Status: DC
  Administered 2021-02-14 – 2021-02-19 (×6): 6 via TOPICAL

## 2021-02-14 MED ORDER — HEPARIN SODIUM (PORCINE) 5000 UNIT/ML IJ SOLN
5000.0000 [IU] | Freq: Three times a day (TID) | INTRAMUSCULAR | Status: DC
  Administered 2021-02-14 – 2021-02-15 (×3): 5000 [IU] via SUBCUTANEOUS
  Filled 2021-02-14 (×3): qty 1

## 2021-02-14 MED ORDER — NOREPINEPHRINE 16 MG/250ML-% IV SOLN
0.0000 ug/min | INTRAVENOUS | Status: DC
  Administered 2021-02-14: 6 ug/min via INTRAVENOUS
  Administered 2021-02-15: 12 ug/min via INTRAVENOUS
  Administered 2021-02-17: 6 ug/min via INTRAVENOUS
  Filled 2021-02-14 (×3): qty 250

## 2021-02-14 NOTE — Consult Note (Addendum)
Consultation Note Date: 02/14/2021   Patient Name: Patrick Anthony  DOB: 1971/03/05  MRN: [id number]  Age / Sex: 50 y.o., male  PCP: [name], MD Referring Physician: Lanier Clam, MD  Reason for Consultation: Establishing goals of care and Psychosocial/spiritual support  HPI/Patient Profile: 50 y.o. male   admitted on 02/09/2021 with a past medical history of endocarditis and diskitis with recent bacteremia, ESRD who presented from [place] with hypoglycemia after he was found unresponsive this morning. He was diaphoretic but did not regain consciousness when given supplemental dextrose. He developed progressive hypoxia and required intubation. He has a history of MRSA aortic endocarditis, diskitis, and bacteremia in February & June 2022, bioprosthetic MVR, HFpEF, chronic PH, ESRD on HD, BKA. He was discharged on vancomycin; last dose unknown. History obtained from chart review due to patient's encephalopathy. Received ceftriaxone and azithromycin in the ED and was started on vasopressors for shock.  Clinical Assessment and Goals of Care:  This NP Wadie Lessen reviewed medical records, received report from team, and then spoke to the  patient's daughter/Ishara Cletus Gash by phone  to discuss diagnosis, prognosis, GOC, EOL wishes disposition and options.   Concept of Palliative Care was introduced as specialized medical care for people and their families living with serious illness.  If focuses on providing relief from the symptoms and stress of a serious illness.  The goal is to improve quality of life for both the patient and the family.  Values and goals of care important to patient and family were attempted to be elicited.  Created space and opportunity for  family to explore thoughts and feelings regarding current medical situation.  [name] verbalizes how hard this all is for her.   It is very  difficult being face with  medical decisions for her father, she does not live local.  " I really don't know what he would want"   I shared with her his docuemtned ACP documents specific to goals of care reflecting his knowledge of his seriousness of his medical situation and the likely long-term poor prognosis, he also documented that he would not want long-term term intubation.  This is helpful for her knowing some of his basic thoughts and understanding of his current medical situation.  Education offered on his current medical situation, the seriousness of his multiple comorbidities and the likely associated poor prognosis.  Education regarding the difference between a aggressive medical intervention path  and a palliative comfort care path for this patient at this time was had.     Questions and concerns addressed.  Family  encouraged to call with questions or concerns.  Family will need ongoing support as H POA/daughter continues to face medical decisions.   PMT will continue to support holistically.     HCPOA- see documents    SUMMARY OF RECOMMENDATIONS    Code Status/Advance Care Planning: DNR- previously documented    Symptom Management:  Per attending   Psycho-social/Spiritual:  Desire for further Chaplaincy support:yes Additional Recommendations: Grief/Bereavement Support  Prognosis:  Unable to determine- will be dependant on decisions regarding life prolonging measures.  Discharge Planning: To Be Determined      Primary Diagnoses: Present on Admission:  Acute respiratory failure with hypoxia ([place])   I have reviewed the medical record, interviewed the patient and family, and examined the patient. The following aspects are pertinent.  Past Medical History:  Diagnosis Date   Bacterial endocarditis 09/27/2020   MRSA   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 09/29/2020   Patient needs f/u CT chest 3 months   Chronic kidney disease    patient on dialysis  tues-thurs-sat   Cirrhosis of liver (HCC)    Drug addiction ([place])    ETOH abuse    Hepatitis    Hypertension    Mitral regurgitation    Patent foramen ovale 10/23/2019   Renal disorder renal failure   S/P minimally-invasive mitral valve replacement with bioprosthetic valve 10/14/2020   31 mm Medtronic Mosaic stented porcine bioprosthetic tissue valve via right mini thoracotomy approach   Sleep apnea    Systolic and diastolic CHF, 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[place]) 04/19/2013   Social History   Socioeconomic History   Marital status: Single    Spouse name: Not on file   Number of children: Not on file   Years of education: Not on file   Highest education level: Not on file  Occupational History   Not on file  Tobacco Use   Smoking status: Former    Packs/day: 0.20    Pack years: 0.00    Types: Cigarettes    Quit date: 10/05/2017    Years since quitting: 3.3   Smokeless tobacco: Never   Tobacco comments:    Quit 3 years ago  Vaping Use   Vaping Use: Never used  Substance and Sexual Activity   Alcohol use: No   Drug use: No    Comment: Quit   Sexual activity: Not Currently    Birth control/protection: Condom  Other Topics Concern   Not on file  Social History Narrative   ** Merged History Encounter **       Social Determinants of Health   Financial Resource Strain: Not on file  Food Insecurity: Not on file  Transportation Needs: Not on file  Physical Activity: Not on file  Stress: Not on file  Social Connections: Not on file   Family History  Problem Relation Age of Onset   Hypertension Mother    Cancer Mother        breast   Scheduled Meds:  B-complex with vitamin C  1 tablet Per Tube Daily   chlorhexidine gluconate (MEDLINE KIT)  15 mL Mouth Rinse BID   Chlorhexidine Gluconate Cloth  6 each Topical Daily   docusate  100 mg Per Tube BID   feeding supplement (PROSource TF)  45 mL Per Tube BID   heparin injection (subcutaneous)  5,000 Units Subcutaneous Q8H    hydrocortisone sod succinate (SOLU-CORTEF) inj  50 mg Intravenous Q6H   insulin aspart  0-6 Units Subcutaneous Q4H   lidocaine (PF)       mouth rinse  15 mL Mouth Rinse 10 times per day   pantoprazole (PROTONIX) IV  40 mg Intravenous QHS   polyethylene glycol  17 g Per Tube Daily   Continuous Infusions:   prismasol BGK 4/2.5 500 mL/hr at 02/14/21 0915    prismasol BGK 4/2.5 300 mL/hr at 02/14/21 0915   azithromycin Stopped (02/14/21 1148)   cefTRIAXone (ROCEPHIN)  IV Stopped (  02/14/21 1223)   dexmedetomidine (PRECEDEX) IV infusion 0.2 mcg/kg/hr (02/14/21 1556)   feeding supplement (PIVOT 1.5 CAL)     fentaNYL infusion INTRAVENOUS Stopped (02/14/21 1449)   norepinephrine (LEVOPHED) Adult infusion     phenylephrine (NEO-SYNEPHRINE) Adult infusion Stopped (03/04/2021 1414)   prismasol BGK 4/2.5 1,500 mL/hr at 02/14/21 1240   remdesivir 100 mg in NS 100 mL Stopped (02/14/21 0913)   vancomycin     vasopressin 0.03 Units/min (02/14/21 1600)   PRN Meds:.docusate sodium, fentaNYL, fentaNYL (SUBLIMAZE) injection, fentaNYL (SUBLIMAZE) injection, heparin, midazolam, polyethylene glycol Medications Prior to Admission:  Prior to Admission medications   Medication Sig Start Date End Date Taking? Authorizing Provider  acetaminophen (TYLENOL) 325 MG tablet Take 325-650 mg by mouth every 6 (six) hours as needed for mild pain or fever. Temp > 100.5    [provider]  aspirin EC 325 MG EC tablet Take 1 tablet (325 mg total) by mouth daily. 11/10/20   Samella Parr, NP  atorvastatin (LIPITOR) 40 MG tablet Take 1 tablet (40 mg total) by mouth daily. 01/27/21   Dessa Phi, DO  bisacodyl (DULCOLAX) 10 MG suppository Place 1 suppository (10 mg total) rectally daily as needed for moderate constipation. 11/09/20   Samella Parr, NP  calcium acetate (PHOSLO) 667 MG capsule Take 667 mg by mouth 3 (three) times daily with meals.    [provider]  cinacalcet (SENSIPAR) 30 MG tablet Take 2  tablets (60 mg total) by mouth daily with breakfast. 11/10/20   Samella Parr, NP  Darbepoetin Alfa (ARANESP, ALBUMIN FREE, IJ) Darbepoetin Alfa (Aranesp) 07/05/20 07/04/21  [provider]  diltiazem (CARDIZEM CD) 120 MG 24 hr capsule Take 1 capsule (120 mg total) by mouth daily. 01/07/21   Ghimire, Henreitta Leber, MD  docusate sodium (COLACE) 100 MG capsule Take 1 capsule (100 mg total) by mouth 2 (two) times daily. 11/09/20   Samella Parr, NP  methocarbamol (ROBAXIN) 500 MG tablet Take 1 tablet (500 mg total) by mouth 3 (three) times daily. 11/09/20   Samella Parr, NP  metoprolol tartrate (LOPRESSOR) 50 MG tablet Take 0.5 tablets (25 mg total) by mouth 2 (two) times daily. 01/06/21   Ghimire, Henreitta Leber, MD  Multiple Vitamin (MULTIVITAMIN WITH MINERALS) TABS tablet Take 1 tablet by mouth daily.    [provider]  nitroGLYCERIN (NITROSTAT) 0.4 MG SL tablet Place 0.4 mg under the tongue every 5 (five) minutes x 3 doses as needed for chest pain.     [provider]  Nutritional Supplements (,FEEDING SUPPLEMENT, PROSOURCE PLUS) liquid Take 30 mLs by mouth 2 (two) times daily between meals. 11/09/20   Samella Parr, NP  Nutritional Supplements (FEEDING SUPPLEMENT, NEPRO CARB STEADY,) LIQD Take 237 mLs by mouth 3 (three) times daily between meals. 11/09/20   Samella Parr, NP  pantoprazole (PROTONIX) 40 MG tablet Take 1 tablet (40 mg total) by mouth daily. 11/10/20   Samella Parr, NP  polyethylene glycol (MIRALAX / GLYCOLAX) 17 g packet Take 17 g by mouth daily as needed for mild constipation. 11/09/20   Samella Parr, NP  pregabalin (LYRICA) 75 MG capsule Take 1 capsule (75 mg total) by mouth at bedtime. 11/09/20   Samella Parr, NP  senna-docusate (SENOKOT-S) 8.6-50 MG tablet Take 1 tablet by mouth 2 (two) times daily. 11/09/20   Samella Parr, NP  sevelamer carbonate (RENVELA) 800 MG tablet Take 1,600 mg by mouth 3 (three) times daily with  meals.    [provider]  Vancomycin (VANCOCIN) 750-5 MG/150ML-% SOLN Inject 150 mLs (750 mg total) into the vein every Monday, Wednesday, and Friday with hemodialysis. 6 weeks of vancomycin from 5/27 12/31/20   Ghimire, Henreitta Leber, MD   No Known Allergies Review of Systems  Unable to perform ROS: Acuity of condition   Physical Exam Constitutional:      Comments: No direct PE performed 2/2 covid 19 status    Vital Signs: BP 121/90   Pulse 98   Temp 98.3 F (36.8 C) (Axillary)   Resp 20   Ht 5' 11"  (1.803 m)   Wt 83 kg   SpO2 100%   BMI 25.52 kg/m  Pain Scale: 0-10   Pain Score: 0-No pain   SpO2: SpO2: 100 % O2 Device:SpO2: 100 % O2 Flow Rate: .O2 Flow Rate (L/min): 8 L/min  IO: Intake/output summary:  Intake/Output Summary (Last 24 hours) at 02/14/2021 1609 Last data filed at 02/14/2021 1600 Gross per 24 hour  Intake 5489.11 ml  Output 3032 ml  Net 2457.11 ml    LBM: Last BM Date: 02/14/21 Baseline Weight: Weight: 65 kg Most recent weight: Weight: 83 kg     Palliative Assessment/Data: 30 % at best   Discussed with bedside RN  Time In: 1600 Time Out: 1710 Time Total: 70 minutes Greater than 50%  of this time was spent counseling and coordinating care related to the above assessment and plan.  Signed by: Wadie Lessen, NP   Please contact Palliative Medicine Team phone at [phone] for questions and concerns.  For individual provider: See Shea Evans

## 2021-02-14 NOTE — Progress Notes (Signed)
[facility] Progress Note  Subjective: Patient not seen directly today given COVID-19 + status, utilizing data taken from chart +/- discussions w/ providers and staff  I/O yest 5.5 L in and nothing out, today I/O matched   Vitals:   02/14/21 1545 02/14/21 1600 02/14/21 1615 02/14/21 1630  BP:      Pulse: 89 98 94 94  Resp: (!) 33 20 20 20   Temp:      TempSrc:      SpO2: 100% 100% 100% 100%  Weight:      Height:        Exam: Patient not seen directly today given COVID-19 + status, utilizing data taken from chart +/- discussions w/ providers and staff.      [facility] HD: [facility]   4h  350/500  66.5kg  2/2 bath  P1  AVF  - mircera 150 ug q2 wks   Assessment/ Plan: ESRD - usual HD MWF. Getting CRRT here started 7/10. Continue.  Septic shock - secondary to pneumonia; pressor support and abx per ccm COVID-19 positive - per CCM AHRF - 2/2 pneumonia and parapneumonic effusion, on vent Volume/ hypertension: EDW 66.5kg. pressors/crrt as above Anemia ckd: Hemoglobin 12.6. Currently receiving mircera 163mg. Last dose 6/27 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, resume binders when taking PO      Rob Lyndsie Wallman 02/14/2021, 4:51 PM   Recent Labs  Lab 02/05/2021 1554 02/14/21 0354 02/14/21 0435 02/14/21 0743 02/14/21 1046  K 6.1* 4.5   < > 3.5 3.7  BUN 42* 35*  --   --   --   CREATININE 6.09* 5.19*  --   --   --   CALCIUM 7.7* 7.6*  --   --   --   PHOS 6.4* 5.4*  --   --   --   HGB  --  10.5*   < > 11.6* 12.2*   < > = values in this interval not displayed.   Inpatient medications:  B-complex with vitamin C  1 tablet Per Tube Daily   chlorhexidine gluconate (MEDLINE KIT)  15 mL Mouth Rinse BID   Chlorhexidine Gluconate Cloth  6 each Topical Daily   docusate  100 mg Per Tube BID   feeding supplement (PROSource TF)  45 mL Per Tube BID   heparin injection (subcutaneous)  5,000 Units Subcutaneous Q8H   hydrocortisone sod succinate (SOLU-CORTEF) inj  50 mg Intravenous Q6H    insulin aspart  0-6 Units Subcutaneous Q4H   lidocaine (PF)       mouth rinse  15 mL Mouth Rinse 10 times per day   pantoprazole (PROTONIX) IV  40 mg Intravenous QHS   polyethylene glycol  17 g Per Tube Daily     prismasol BGK 4/2.5 500 mL/hr at 02/14/21 0915    prismasol BGK 4/2.5 300 mL/hr at 02/14/21 0915   azithromycin Stopped (02/14/21 1148)   cefTRIAXone (ROCEPHIN)  IV Stopped (02/14/21 1223)   dexmedetomidine (PRECEDEX) IV infusion 0.2 mcg/kg/hr (02/14/21 1556)   feeding supplement (PIVOT 1.5 CAL)     fentaNYL infusion INTRAVENOUS Stopped (02/14/21 1449)   norepinephrine (LEVOPHED) Adult infusion 6 mcg/min (02/14/21 1643)   phenylephrine (NEO-SYNEPHRINE) Adult infusion Stopped (02/24/2021 1414)   prismasol BGK 4/2.5 1,500 mL/hr at 02/14/21 1240   remdesivir 100 mg in NS 100 mL Stopped (02/14/21 0913)   vancomycin     vasopressin 0.03 Units/min (02/14/21 1600)   docusate sodium, fentaNYL, fentaNYL (SUBLIMAZE) injection, fentaNYL (SUBLIMAZE) injection, heparin, midazolam, polyethylene glycol

## 2021-02-14 NOTE — Progress Notes (Signed)
NAME:  Patrick Anthony, MRN:  [id number], DOB:  February 08, 1971, LOS: 1 ADMISSION DATE:  03/01/2021, CONSULTATION DATE:  02/04/2021 REFERRING MD:  Dina Rich- TRH, CHIEF COMPLAINT:  respiratory failure on vent   History of Present Illness:  Patrick Anthony is a 50 y/o gentleman with a history of endocarditis and diskitis with recent bacteremia, ESRD who presented from [place] with hypoglycemia after he was found unresponsive this morning. He was diaphoretic but did not regain consciousness when given supplemental dextrose. He developed progressive hypoxia and required intubation. He has a history of MRSA aortic endocarditis, diskitis, and bacteremia in February & June 2022, bioprosthetic MVR, HFpEF, chronic PH, ESRD on HD, BKA. He was discharged on vancomycin; last dose unknown. History obtained from chart review due to patient's encephalopathy. Received ceftriaxone and azithromycin in the ED and was started on vasopressors for shock.  Pertinent  Medical History  MRSA aortic endocarditis, diskitis, and bacteremia in June 2022 bioprosthetic MVR, HFpEF, chronic PH, PFI ESRD on HD BKA Cirrhosis OSA  Significant Hospital Events: Including procedures, antibiotic start and stop dates in addition to other pertinent events   7/10 admitted, intubated, on pressors  Interim History / Subjective:  Patient laying in bed on ventilation. Sedated, recent dose of fentanyl and midazolam for agitation. Was following commands prior to medications per RN.  Objective   Blood pressure 121/90, pulse 83, temperature (!) 97.5 F (36.4 C), temperature source Axillary, resp. rate (!) 23, height '5\' 11"'$  (1.803 m), weight 83 kg, SpO2 99 %.    Vent Mode: PRVC FiO2 (%):  [40 %-100 %] 40 % Set Rate:  [16 bmp-32 bmp] 20 bmp Vt Set:  [450 mL-600 mL] 450 mL PEEP:  [5 cmH20-8 cmH20] 5 cmH20 Plateau Pressure:  [22 cmH20-32 cmH20] 22 cmH20   Intake/Output Summary (Last 24 hours) at 02/14/2021 0913 Last data filed at 02/14/2021  0800 Gross per 24 hour  Intake 7349.9 ml  Output 1724 ml  Net 5625.9 ml   Filed Weights   02/15/2021 1130 02/14/21 0500  Weight: 65 kg 83 kg    Examination: General: critically ill appearing man intubated, sedated on fentanyl and midazolam HENT: [place]/AT, eyes anicteric Lungs: decreased R lateral breath sounds, CTA on left Cardiovascular: normal rate, s1, s2 present Abdomen: soft, non distended, active BS Extremities: fistula LUE positive bruit, BKA RLE warm pulses intact Neuro: RASS -5 Skin: no rashes or ecchymoses  Resolved Hospital Problem list     Assessment & Plan:  Acute respiratory failure with hypoxia due to R pneumonia-POA R parapneumonic effusion- POA COVID pneumonia Elevated pH of 7.7 with pCO2 of 17.4, bicarb 23.3 on ABG this morning. On bicarb. Will discontinue bicarb and recheck ABG. -LTVV, 4-8cc/kg IBW with goal Pplat<30 and DP<15.  -Continue on ventilator support -VAP prevention protocol -PAD protocol for sedation; fentanyl off now due to hypotension and presentation with being obtunded  Septic shock due to pneumonia MRSA endocarditis with stenosis and fracture/perforation the mitral prosthesis MV and HR likely contributing to pleural effusions. Not a surgical candidate for valve repair given multiple chronic illness and poor baseline health. Lactic acid remain elevated. -continue vanc for endocarditis, ceftriaxone, azithromycin to cover CAP - On remdesivir day 2 -hydrocortisone 50 mg q6 -blood & trach aspirate cultures -pressors to maintain MAP>65 -Daughter declined to place chest tube for R parapneumonic effusion for now, consider bedside US  Acute encephalopathy, likely metabolic in origin-POA CT head unrevealing, hypoglycemia improving -minimize sedation as able to facilitate ongoing monitoring  Hypoglycemia due  to sepsis Stable in 160-170s  -CBG monitoring q4 -SSI sensitive -RD consulted to start feed  ESRD on HD-POA Hyperkalemia due to metabolic  acidosis-POA K improved to 4.5 this morning -nephrology consult; has femoral access -Currently undergoing CRRT this morning   Chronic anemia due to renal failure-POA Acute thrombocytopenia likely due to sepsis -POA DIC panel with elevated PT and aPTT, elevated ddimer, fibirnogen low normal 211, platetets low. Bleeding from fem line but no other signs of acute bleeding -transfuse for Hb<7 or hemodynamically significant bleeding -transfuse for platelets <10 or <50 with bleeding  GoC -Will continue to discuss daughter concerning [place] given poor prognosis given poor baseline health, multiple recent admissions, and MRSA infectious with inability to achieve complete source control. He is acutely ill in MOF and may not survive this admission.  -Palliative Care has been consulted to help with his ongoing care.   Best Practice (right click and "Reselect all SmartList Selections" daily)  Diet/type: NPO DVT prophylaxis: prophylactic heparin  GI prophylaxis: PPI Lines: Central line and Arterial Line Foley:  N/A Code Status:  DNR Last date of multidisciplinary goals of care discussion [7/10- daughter [name] ]  Labs   CBC: Recent Labs  Lab 02/24/2021 [phone] 03/01/2021 0948 02/19/2021 1218 02/24/2021 1936 02/14/21 0354 02/14/21 0435 02/14/21 0743  WBC 4.6  --   --   --  14.1*  --   --   NEUTROABS 3.2  --   --   --   --   --   --   HGB 10.3* 13.6  12.9* 12.6*  --  10.5* 12.6* 11.6*  HCT 36.8* 40.0  38.0* 37.0*  --  35.9* 37.0* 34.0*  MCV 95.8  --   --   --  91.6  --   --   PLT 54*  --   --  47* 65*  --   --     Basic Metabolic Panel: Recent Labs  Lab 02/06/2021 0938 02/08/2021 0948 02/12/2021 1218 02/17/2021 1554 02/14/21 0354 02/14/21 0435 02/14/21 0743  NA 134* 131*  132* 130* 136 132* 132* 132*  K 6.0* 5.9*  6.0* 5.7* 6.1* 4.5 4.2 3.5  CL 93* 98  --  94* 90*  --   --   CO2 15*  --   --  10* 16*  --   --   GLUCOSE 60* 74  --  126* 205*  --   --   BUN 44* 46*  --  42* 35*  --   --    CREATININE 6.55* 7.00*  --  6.09* 5.19*  --   --   CALCIUM 7.3*  --   --  7.7* 7.6*  --   --   MG  --   --   --   --  1.9  --   --   PHOS  --   --   --  6.4* 5.4*  --   --    GFR: Estimated Creatinine Clearance: 18.3 mL/min (A) (by C-G formula based on SCr of 5.19 mg/dL (H)). Recent Labs  Lab 02/19/2021 0938 02/18/2021 1554 02/10/2021 1936 02/14/21 0354  WBC 4.6  --   --  14.1*  LATICACIDVEN  --  >11.0* >11.0* >11.0*    Liver Function Tests: Recent Labs  Lab 03/04/2021 0938 02/15/2021 1554 02/14/21 0354  AST 88*  --   --   ALT 25  --   --   ALKPHOS 86  --   --   BILITOT 3.5*  --   --  PROT 7.9  --   --   ALBUMIN 2.2* 1.9* 1.9*   No results for input(s): LIPASE, AMYLASE in the last 168 hours. No results for input(s): AMMONIA in the last 168 hours.  ABG    Component Value Date/Time   PHART 7.732 (HH) 02/14/2021 0743   PCO2ART 17.4 (LL) 02/14/2021 0743   PO2ART 141 (H) 02/14/2021 0743   HCO3 23.3 02/14/2021 0743   TCO2 24 02/14/2021 0743   ACIDBASEDEF 5.0 (H) 02/14/2021 0435   O2SAT 100.0 02/14/2021 0743     Coagulation Profile: Recent Labs  Lab 02/11/2021 1936  INR 2.4*    Cardiac Enzymes: No results for input(s): CKTOTAL, CKMB, CKMBINDEX, TROPONINI in the last 168 hours.  HbA1C: Hgb A1c MFr Bld  Date/Time Value Ref Range Status  09/29/2020 04:35 AM 5.5 4.8 - 5.6 % Final    Comment:    (NOTE) Pre diabetes:          5.7%-6.4%  Diabetes:              >6.4%  Glycemic control for   <7.0% adults with diabetes   09/28/2020 08:27 AM 5.4 4.8 - 5.6 % Final    Comment:    (NOTE) Pre diabetes:          5.7%-6.4%  Diabetes:              >6.4%  Glycemic control for   <7.0% adults with diabetes     CBG: Recent Labs  Lab  1921 02/27/2021 2318 02/14/21 0222 02/14/21 0354 02/14/21 0726  GLUCAP 160* 234* 277* 165* 175*    Review of Systems:   Unable to be obtained due to encephalopathy.  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), Drug addiction ([place]), ETOH abuse, Hepatitis, Hypertension, Mitral regurgitation, Patent foramen ovale (10/23/2019), Renal disorder (renal failure), S/P minimally-invasive mitral valve replacement with bioprosthetic valve (10/14/2020), Sleep apnea, and Systolic and diastolic CHF, 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[place]) (04/19/2013).   Surgical History:   Past Surgical History:  Procedure Laterality Date   AMPUTATION Right 10/29/2020   Procedure: RIGHT ABOVE KNEE AMPUTATION;  Surgeon: [name], MD;  Location: [place];  Service: Orthopedics;  Laterality: Right;   AV FISTULA PLACEMENT Right    AV FISTULA PLACEMENT Left 09/03/2013   Procedure: ARTERIOVENOUS (AV) FISTULA CREATION - LEFT BRACHIOCEPHALIC;  Surgeon: Conrad Skiatook, MD;  Location: [place];  Service: Vascular;  Laterality: Left;   AV FISTULA PLACEMENT Left 10/30/2019   Procedure: INSERTION OF ARTERIOVENOUS (AV) GORE-TEX GRAFT ARM;  Surgeon: Rosetta Posner, MD;  Location: [place];  Service: Vascular;  Laterality: Left;   BUBBLE STUDY  10/05/2020   Procedure: BUBBLE STUDY;  Surgeon: Pixie Casino, MD;  Location: [place];  Service: Cardiovascular;;   I & D EXTREMITY Right 10/20/2020   Procedure: IRRIGATION AND DEBRIDEMENT OF LEG;  Surgeon: [name], MD;  Location: [place];  Service: Orthopedics;  Laterality: Right;   INSERTION OF DIALYSIS CATHETER N/A 08/29/2013   Procedure: INSERTION OF DIALYSIS CATHETER;  Surgeon: Mal Misty, MD;  Location: [place];  Service: Vascular;  Laterality: N/A;   INSERTION OF DIALYSIS CATHETER Left 10/30/2019   Procedure: INSERTION OF DIALYSIS CATHETER LEFT INTERNAL JUGULAR;  Surgeon: Rosetta Posner, MD;  [facility];  Service: Vascular;  Laterality: Left;   LEFT HEART CATH AND  CORONARY ANGIOGRAPHY N/A 10/07/2020   Procedure: LEFT HEART CATH AND CORONARY ANGIOGRAPHY;  Surgeon: Lorretta Harp, MD;  Location: [place] [facility];  Service: Cardiovascular;  Laterality: N/A;   MITRAL VALVE REPAIR Right 10/14/2020   Procedure: MINIMALLY INVASIVE MITRAL VALVE  REPLACEMENT(MVR) USING MEDTRONIC MOSAIC VALVE SIZE 31MM;  Surgeon: Rexene Alberts, MD;  Location: [place];  Service: Open Heart Surgery;  Laterality: Right;   MULTIPLE EXTRACTIONS WITH ALVEOLOPLASTY N/A 10/12/2020   Procedure: MULTIPLE EXTRACTION WITH ALVEOLOPLASTY;  Surgeon: [name], DMD;  Location: [place];  Service: Dentistry;  Laterality: N/A;   PERIPHERAL VASCULAR CATHETERIZATION N/A 01/06/2015   Procedure: A/V Shuntogram/Fistulagram;  Surgeon: Katha Cabal, MD;  Location: [place] [facility];  Service: Cardiovascular;  Laterality: N/A;   PERIPHERAL VASCULAR CATHETERIZATION Left 01/06/2015   Procedure: A/V Shunt Intervention;  Surgeon: Katha Cabal, MD;  Location: [place] [facility];  Service: Cardiovascular;  Laterality: Left;   REPAIR OF PATENT FORAMEN OVALE N/A 10/14/2020   Procedure: CLOSURE OF PATENT FORAMEN OVALE;  Surgeon: Rexene Alberts, MD;  Location: [place];  Service: Open Heart Surgery;  Laterality: N/A;   TEE WITHOUT CARDIOVERSION N/A 10/05/2020   Procedure: TRANSESOPHAGEAL ECHOCARDIOGRAM (TEE);  Surgeon: Pixie Casino, MD;  Location: [place];  Service: Cardiovascular;  Laterality: N/A;   TEE WITHOUT CARDIOVERSION N/A 10/14/2020   Procedure: TRANSESOPHAGEAL ECHOCARDIOGRAM (TEE);  Surgeon: Rexene Alberts, MD;  Location: [place];  Service: Open Heart Surgery;  Laterality: N/A;   TEE WITHOUT CARDIOVERSION N/A 12/24/2020   Procedure: TRANSESOPHAGEAL ECHOCARDIOGRAM (TEE);  Surgeon: Pixie Casino, MD;  Location: [facility];  Service: Cardiovascular;  Laterality: N/A;     Social History:   reports that he quit smoking about 3 years ago. His smoking use included cigarettes. He smoked an average of 0.20 packs per day. He has never used smokeless tobacco. He reports that he does not drink alcohol and  does not use drugs.   Family History:  His family history includes Cancer in his mother; Hypertension in his mother.   Allergies No Known Allergies   Home Medications  Prior to Admission medications   Medication Sig Start Date End Date Taking? Authorizing Provider  acetaminophen (TYLENOL) 325 MG tablet Take 325-650 mg by mouth every 6 (six) hours as needed for mild pain or fever. Temp > 100.5    [provider]  aspirin EC 325 MG EC tablet Take 1 tablet (325 mg total) by mouth daily. 11/10/20   Samella Parr, NP  atorvastatin (LIPITOR) 40 MG tablet Take 1 tablet (40 mg total) by mouth daily. 01/27/21   Dessa Phi, DO  bisacodyl (DULCOLAX) 10 MG suppository Place 1 suppository (10 mg total) rectally daily as needed for moderate constipation. 11/09/20   Samella Parr, NP  calcium acetate (PHOSLO) 667 MG capsule Take 667 mg by mouth 3 (three) times daily with meals.    [provider]  cinacalcet (SENSIPAR) 30 MG tablet Take 2 tablets (60 mg total) by mouth daily with breakfast. 11/10/20   Samella Parr, NP  Darbepoetin Alfa (ARANESP, ALBUMIN FREE, IJ) Darbepoetin Alfa (Aranesp) 07/05/20 07/04/21  [provider]  diltiazem (CARDIZEM CD) 120 MG 24 hr capsule Take 1 capsule (120 mg total) by mouth daily. 01/07/21   Ghimire, Henreitta Leber, MD  docusate sodium (COLACE) 100 MG capsule Take 1 capsule (100 mg total) by mouth 2 (two) times daily. 11/09/20   Samella Parr, NP  methocarbamol (  ROBAXIN) 500 MG tablet Take 1 tablet (500 mg total) by mouth 3 (three) times daily. 11/09/20   Samella Parr, NP  metoprolol tartrate (LOPRESSOR) 50 MG tablet Take 0.5 tablets (25 mg total) by mouth 2 (two) times daily. 01/06/21   Ghimire, Henreitta Leber, MD  Multiple Vitamin (MULTIVITAMIN WITH MINERALS) TABS tablet Take 1 tablet by mouth daily.    [provider]  nitroGLYCERIN (NITROSTAT) 0.4 MG SL tablet Place 0.4 mg under the tongue every 5 (five) minutes x 3 doses as needed for  chest pain.     [provider]  Nutritional Supplements (,FEEDING SUPPLEMENT, PROSOURCE PLUS) liquid Take 30 mLs by mouth 2 (two) times daily between meals. 11/09/20   Samella Parr, NP  Nutritional Supplements (FEEDING SUPPLEMENT, NEPRO CARB STEADY,) LIQD Take 237 mLs by mouth 3 (three) times daily between meals. 11/09/20   Samella Parr, NP  pantoprazole (PROTONIX) 40 MG tablet Take 1 tablet (40 mg total) by mouth daily. 11/10/20   Samella Parr, NP  polyethylene glycol (MIRALAX / GLYCOLAX) 17 g packet Take 17 g by mouth daily as needed for mild constipation. 11/09/20   Samella Parr, NP  pregabalin (LYRICA) 75 MG capsule Take 1 capsule (75 mg total) by mouth at bedtime. 11/09/20   Samella Parr, NP  senna-docusate (SENOKOT-S) 8.6-50 MG tablet Take 1 tablet by mouth 2 (two) times daily. 11/09/20   Samella Parr, NP  sevelamer carbonate (RENVELA) 800 MG tablet Take 1,600 mg by mouth 3 (three) times daily with meals.    [provider]  Vancomycin (VANCOCIN) 750-5 MG/150ML-% SOLN Inject 150 mLs (750 mg total) into the vein every Monday, Wednesday, and Friday with hemodialysis. 6 weeks of vancomycin from 5/27 12/31/20   Jonetta Osgood, MD     Iona Beard, MD 02/14/21 9:13 AM Internal Medicine, PGY-2 Pager [phone]

## 2021-02-14 NOTE — Progress Notes (Signed)
Pharmacy Antibiotic Note  Patrick Anthony is a 50 y.o. male admitted on 02/14/2021 presenting from facility with AMS and concern for PNA. The patient has a history of MRSA endocarditis/discitis with planned Vancomycin course completion on 7/8. Pharmacy consulted to resume Vancomycin dosing along with Rocephin + Azithromycin per MD.    The patient was transitioned to CRRT on 7/10 PM. A vancomycin random was checked this morning and was 26 mcg/ml. Will expect the patient to be ready for re-dosing after ~14h of CRRT.   Plan: - Start Vancomycin 1g IV every 24 hours - next dose at 2200 this evening - while on CRRT - Will continue to follow CRRT duration/tolerance, culture results, LOT, and antibiotic de-escalation plans   Height: '5\' 11"'$  (180.3 cm) Weight: 83 kg (182 lb 15.7 oz) IBW/kg (Calculated) : 75.3  Temp (24hrs), Avg:94.4 F (34.7 C), Min:89.2 F (31.8 C), Max:97.7 F (36.5 C)  Recent Labs  Lab 02/21/2021 0938 02/04/2021 0948 02/12/2021 1554 02/27/2021 1936 02/14/21 0354  WBC 4.6  --   --   --  14.1*  CREATININE 6.55* 7.00* 6.09*  --  5.19*  LATICACIDVEN  --   --  >11.0* >11.0* >11.0*     Estimated Creatinine Clearance: 18.3 mL/min (A) (by C-G formula based on SCr of 5.19 mg/dL (H)).    No Known Allergies  Azithromycin 7/10 >> Rocephin 7/10 >> Vancomycin 7/10 >>  7/10 COVID >> positive 7/10 MRSA PCR >> neg 7/10 BCx >> ng<24h  Thank you for allowing pharmacy to be a part of this patient's care.  Alycia Rossetti, PharmD, BCPS Clinical Pharmacist Clinical phone for 02/14/2021: [phone] 02/14/2021 10:53 AM   **Pharmacist phone directory can now be found on [email or web address] ([email or web address]).  Listed under [place].

## 2021-02-14 NOTE — Procedures (Signed)
Arterial Catheter Insertion Procedure Note  Patrick Anthony  [id number]  1971-01-16  Date:02/14/21  Time:6:03 AM    Provider Performing: Estill Cotta    Procedure: Insertion of Arterial Line [phone]) with US guidance [id number])   Indication(s) Blood pressure monitoring and/or need for frequent ABGs  Consent Unable to obtain consent due to emergent nature of procedure.  Anesthesia None   Time Out Verified patient identification, verified procedure, site/side was marked, verified correct patient position, special equipment/implants available, medications/allergies/relevant history reviewed, required imaging and test results available.   Sterile Technique Maximal sterile technique including full sterile barrier drape, hand hygiene, sterile gown, sterile gloves, mask, hair covering, sterile ultrasound probe cover (if used).   Procedure Description Area of catheter insertion was cleaned with chlorhexidine and draped in sterile fashion. With real-time ultrasound guidance an arterial catheter was placed into the right radial artery.  Appropriate arterial tracings confirmed on monitor.     Complications/Tolerance None; patient tolerated the procedure well.   EBL Minimal   Specimen(s) None  Redmond School., MSN, APRN, AGACNP-BC [facility]  02/14/2021 , 6:04 AM  Please see [email or web address] for pager details  If no response, please call [phone] After hours, please call Elink at [phone]

## 2021-02-14 NOTE — Progress Notes (Signed)
Critical ABG results given to Dr Silas Flood at 0747 pH- 7.73 pCO2- 17.4 pO2-141 HCO3- 23.3  Orders given to decrease Vt to 6 cc/kg & RR-20. Follow up ABG at 1000.

## 2021-02-14 NOTE — Progress Notes (Signed)
Called by RN that patient had self extubated. Upon arrival to room patient is maintaining SpO2 above 92% on RA. MD at bedside says to turn off sedation & see how patient does. Placed on [place] Hi Flow [place] 8L.

## 2021-02-14 NOTE — Progress Notes (Signed)
Initial Nutrition Assessment  DOCUMENTATION CODES:   Not applicable  INTERVENTION:   Initiate tube feeds via OG tube: - Start Pivot 1.5 @ 20 ml/hr and advance by 10 ml q 8 hours to goal rate of 50 ml/hr (1200 ml/day) - ProSource TF 45 ml BID  Tube feeding regimen at goal provides 1880 kcal, 135 grams of protein, and 911 ml of H2O.   - B-complex with vitamin C daily per tube to account for losses with CRRT  NUTRITION DIAGNOSIS:   Increased nutrient needs related to acute illness (COVID-19 pneumonia, septic shock requiring CRRT) as evidenced by estimated needs.  GOAL:   Patient will meet greater than or equal to 90% of their needs  MONITOR:   Vent status, Labs, Weight trends, TF tolerance, I & O's  REASON FOR ASSESSMENT:   Ventilator, Consult Enteral/tube feeding initiation and management  ASSESSMENT:   50 year old male who presented to the ED on 7/10 from SNF after being found unresponsive. Pt required intubation in the ED. PMH of HTN, CHF, endocarditis and discitis with recent bacteremia, ESRD on HD, BKA. Pt admitted with acute respiratory failure with hypoxia due to pneumonia as well as septic shock. Pt also found to be COVID-19 positive.  7/10 - CRRT initiated  Discussed pt with RN and during ICU rounds. Pt with COVID pneumonia and pleural effusions. Pt following commands this morning per notes. Pt remains on CRRT.  Consult received for tube feeding initiation and management. Pt with OG tube in stomach per x-ray. Will start tube feeding at trickle rate and slowly advance to goal. Given pt on CRRT, recommend B-complex with vitamin C to account for losses.  No family present at time of RD visit. Pt from SNF. Unsure of pt's PO intake PTA.  Reviewed weight history in chart. Excluding current weight of 83 kg which is likely falsely elevated, pt's weight has been steadily decreasing over the last year. Pt is at risk for malnutrition.  Noted Palliative Care consult  pending.  EDW: 66.5 kg Admit weight: 65 kg Current weight: 83 kg  Current weight is well above EDW and admit weight. Pt is net positive 5.7 L since admit and has generalized edema per nursing documentation.  Patient is currently intubated on ventilator support MV: 9.4 L/min Temp (24hrs), Avg:94.9 F (34.9 C), Min:89.2 F (31.8 C), Max:97.7 F (36.5 C)  Drips: Fentanyl Levophed Vasopressin  Medications reviewed and include: colace, IV solu-cortef, SSI q 4 hours, IV protonix, miralax, IV abx, IV magnesium sulfate 2 grams once, remdesivir  Labs reviewed: sodium 134, ionized calcium 0.91, phosphorus 5.4, magnesium 1.9, lactic acid >11.0 CBG's: 78-277 x 24 hours  CRRT UF: 1534 ml x 12 hours I/O's: +5.7 L since admit  NUTRITION - FOCUSED PHYSICAL EXAM:  Deferred.  Diet Order:   Diet Order             Diet NPO time specified  Diet effective now                   EDUCATION NEEDS:   Not appropriate for education at this time  Skin:  Skin Assessment: Reviewed RN Assessment  Last BM:  02/14/21 large type 6  Height:   Ht Readings from Last 1 Encounters:  02/14/21 '5\' 11"'$  (1.803 m)    Weight:   Wt Readings from Last 1 Encounters:  02/14/21 83 kg    Ideal Body Weight:  73.1 kg (adjusted for R BKA)  Current BMI:  Body mass index is  25.52 kg/m.  Current adjusted BMI: 27.4 kg/m   Adjusted BMI using EDW: 21.8 kg/m  Estimated Nutritional Needs:   Kcal:  1800-2000  Protein:  130-150 grams  Fluid:  1000 ml + UOP    Gustavus Bryant, MS, RD, LDN Inpatient Clinical Dietitian Please see AMiON for contact information.

## 2021-02-14 NOTE — Progress Notes (Signed)
ABG attempted x 4 by 2 RT's with venous return. RT called MD and asked if venous sample would be acceptable due to limited access. MD ok with venous sample.

## 2021-02-15 ENCOUNTER — Inpatient Hospital Stay (HOSPITAL_COMMUNITY): Payer: Medicaid Other

## 2021-02-15 DIAGNOSIS — J9601 Acute respiratory failure with hypoxia: Secondary | ICD-10-CM | POA: Diagnosis not present

## 2021-02-15 LAB — RENAL FUNCTION PANEL
Albumin: 2 g/dL — ABNORMAL LOW (ref 3.5–5.0)
Albumin: 2.1 g/dL — ABNORMAL LOW (ref 3.5–5.0)
Anion gap: 20 — ABNORMAL HIGH (ref 5–15)
Anion gap: 21 — ABNORMAL HIGH (ref 5–15)
BUN: 18 mg/dL (ref 6–20)
BUN: 15 mg/dL (ref 6–20)
CO2: 13 mmol/L — ABNORMAL LOW (ref 22–32)
CO2: 11 mmol/L — ABNORMAL LOW (ref 22–32)
Calcium: 7.4 mg/dL — ABNORMAL LOW (ref 8.9–10.3)
Calcium: 7.6 mg/dL — ABNORMAL LOW (ref 8.9–10.3)
Chloride: 99 mmol/L (ref 98–111)
Chloride: 100 mmol/L (ref 98–111)
Creatinine, Ser: 2.68 mg/dL — ABNORMAL HIGH (ref 0.61–1.24)
Creatinine, Ser: 2.09 mg/dL — ABNORMAL HIGH (ref 0.61–1.24)
GFR, Estimated: 28 mL/min — ABNORMAL LOW (ref >60)
GFR, Estimated: 38 mL/min — ABNORMAL LOW (ref >60)
Glucose, Bld: 58 mg/dL — ABNORMAL LOW (ref 70–99)
Glucose, Bld: 69 mg/dL — ABNORMAL LOW (ref 70–99)
Phosphorus: 4.6 mg/dL (ref 2.5–4.6)
Phosphorus: 4.1 mg/dL (ref 2.5–4.6)
Potassium: 4.8 mmol/L (ref 3.5–5.1)
Potassium: 4.9 mmol/L (ref 3.5–5.1)
Sodium: 132 mmol/L — ABNORMAL LOW (ref 135–145)
Sodium: 132 mmol/L — ABNORMAL LOW (ref 135–145)

## 2021-02-15 LAB — GLUCOSE, CAPILLARY
Glucose-Capillary: 115 mg/dL — ABNORMAL HIGH (ref 70–99)
Glucose-Capillary: 54 mg/dL — ABNORMAL LOW (ref 70–99)
Glucose-Capillary: 67 mg/dL — ABNORMAL LOW (ref 70–99)
Glucose-Capillary: 126 mg/dL — ABNORMAL HIGH (ref 70–99)
Glucose-Capillary: 97 mg/dL (ref 70–99)
Glucose-Capillary: 80 mg/dL (ref 70–99)
Glucose-Capillary: 67 mg/dL — ABNORMAL LOW (ref 70–99)
Glucose-Capillary: 110 mg/dL — ABNORMAL HIGH (ref 70–99)

## 2021-02-15 LAB — POCT I-STAT 7, (LYTES, BLD GAS, ICA,H+H)
Acid-base deficit: 9 mmol/L — ABNORMAL HIGH (ref 0.0–2.0)
Bicarbonate: 15.2 mmol/L — ABNORMAL LOW (ref 20.0–28.0)
Calcium, Ion: 0.96 mmol/L — ABNORMAL LOW (ref 1.15–1.40)
HCT: 39 % (ref 39.0–52.0)
Hemoglobin: 13.3 g/dL (ref 13.0–17.0)
O2 Saturation: 100 %
Patient temperature: 41
Potassium: 4.6 mmol/L (ref 3.5–5.1)
Sodium: 133 mmol/L — ABNORMAL LOW (ref 135–145)
TCO2: 16 mmol/L — ABNORMAL LOW (ref 22–32)
pCO2 arterial: 33.3 mmHg (ref 32.0–48.0)
pH, Arterial: 7.286 — ABNORMAL LOW (ref 7.350–7.450)
pO2, Arterial: 211 mmHg — ABNORMAL HIGH (ref 83.0–108.0)

## 2021-02-15 LAB — MAGNESIUM: Magnesium: 2.5 mg/dL — ABNORMAL HIGH (ref 1.7–2.4)

## 2021-02-15 LAB — CBC
HCT: 33.7 % — ABNORMAL LOW (ref 39.0–52.0)
Hemoglobin: 9.9 g/dL — ABNORMAL LOW (ref 13.0–17.0)
MCH: 26.8 pg (ref 26.0–34.0)
MCHC: 29.4 g/dL — ABNORMAL LOW (ref 30.0–36.0)
MCV: 91.1 fL (ref 80.0–100.0)
Platelets: 78 10*3/uL — ABNORMAL LOW (ref 150–400)
RBC: 3.7 MIL/uL — ABNORMAL LOW (ref 4.22–5.81)
RDW: 23.9 % — ABNORMAL HIGH (ref 11.5–15.5)
WBC: 22.1 10*3/uL — ABNORMAL HIGH (ref 4.0–10.5)
nRBC: 0.4 % — ABNORMAL HIGH (ref 0.0–0.2)

## 2021-02-15 LAB — PROTIME-INR
INR: 3.1 — ABNORMAL HIGH (ref 0.8–1.2)
Prothrombin Time: 31.6 seconds — ABNORMAL HIGH (ref 11.4–15.2)

## 2021-02-15 LAB — HEPATIC FUNCTION PANEL
ALT: 618 U/L — ABNORMAL HIGH (ref 0–44)
AST: 2004 U/L — ABNORMAL HIGH (ref 15–41)
Albumin: 2 g/dL — ABNORMAL LOW (ref 3.5–5.0)
Alkaline Phosphatase: 104 U/L (ref 38–126)
Bilirubin, Direct: 2.6 mg/dL — ABNORMAL HIGH (ref 0.0–0.2)
Indirect Bilirubin: 1.9 mg/dL — ABNORMAL HIGH (ref 0.3–0.9)
Total Bilirubin: 4.5 mg/dL — ABNORMAL HIGH (ref 0.3–1.2)
Total Protein: 7 g/dL (ref 6.5–8.1)

## 2021-02-15 LAB — LACTIC ACID, PLASMA
Lactic Acid, Venous: >11 mmol/L (ref 0.5–1.9)
Lactic Acid, Venous: >11 mmol/L (ref 0.5–1.9)

## 2021-02-15 IMAGING — DX DG CHEST 1V PORT
1 series · 1 of 1 positions shown · non-contrast
Comparison: Radiograph [DATE] and [DATE].  CT [DATE].

CLINICAL DATA: Hypoxia. [YZ] infection. History of left upper
lobe mass, hypertension and congestive heart failure.

EXAM:
PORTABLE CHEST 1 VIEW

[chest]
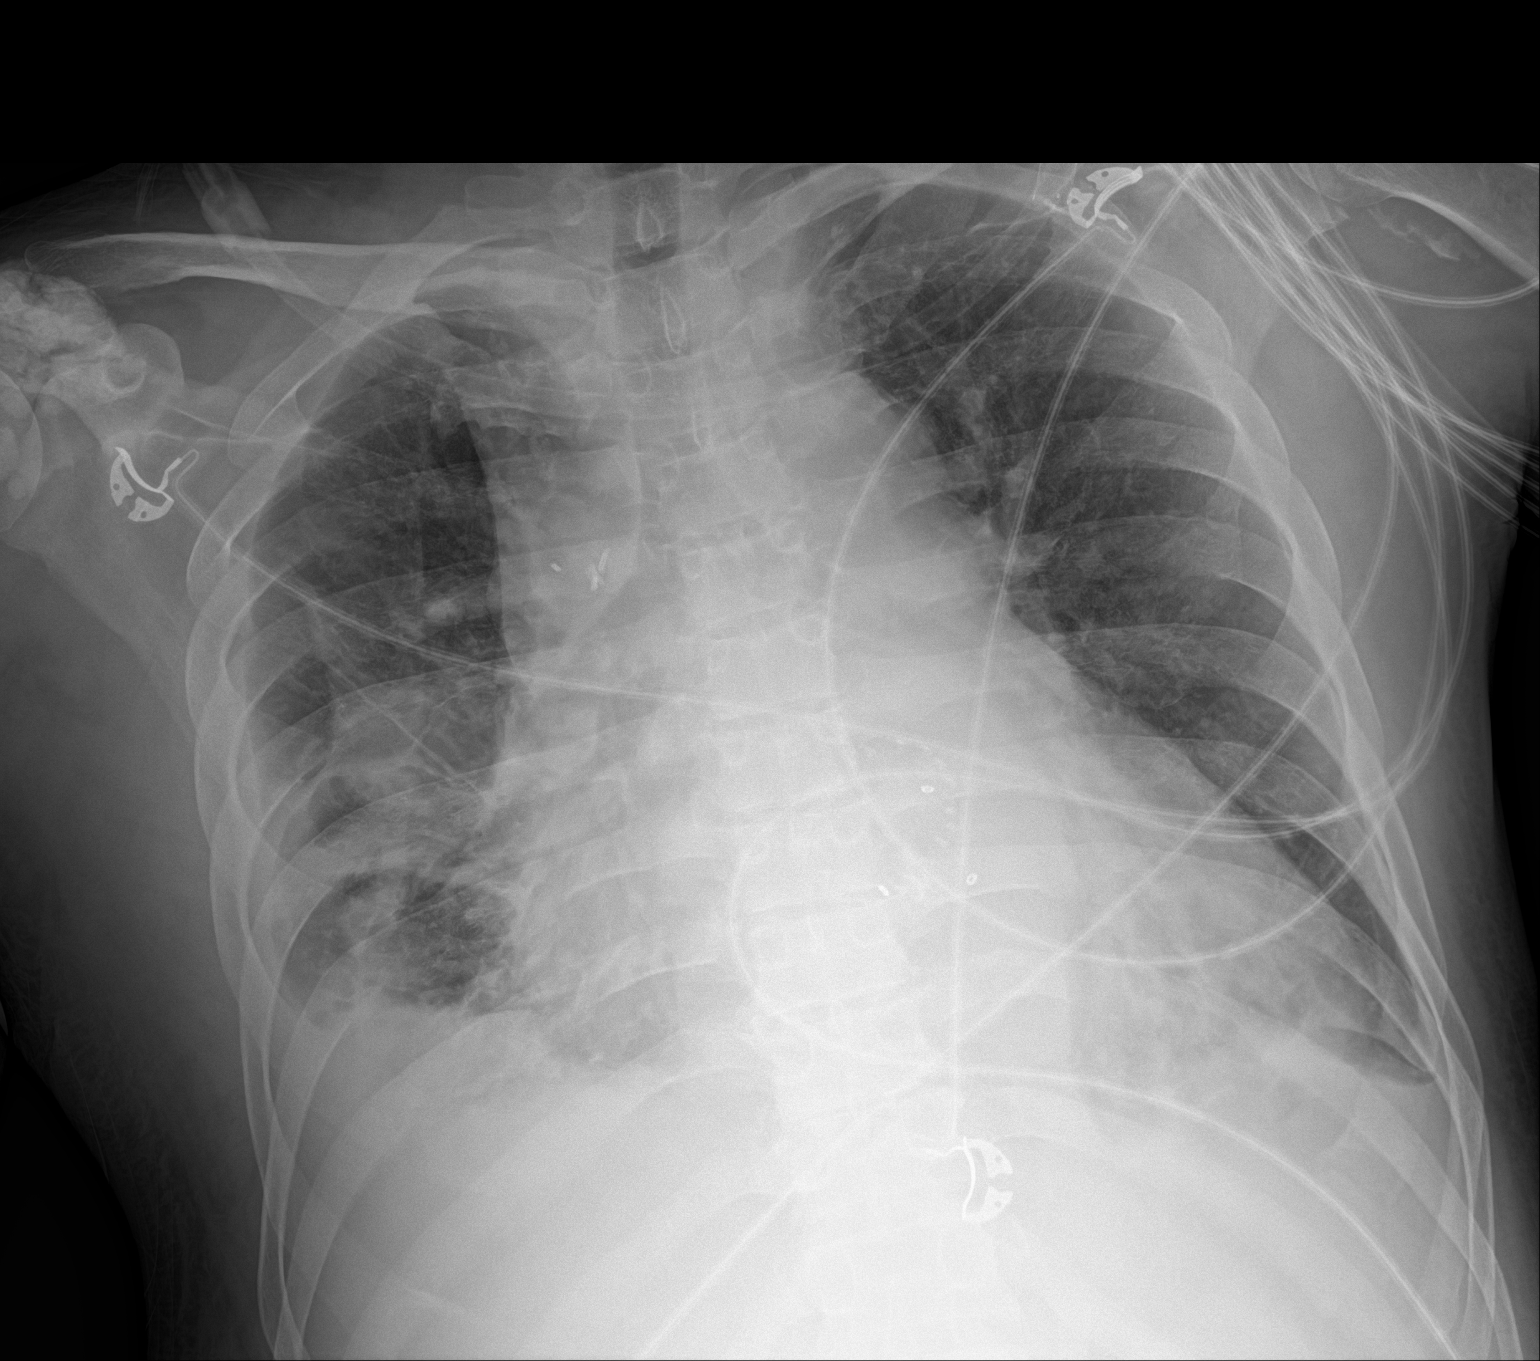

[1 of 1 positions shown; findings below may reference images not displayed]

FINDINGS: [YZ] hours. Interval extubation and removal of the enteric tube.
Peripheral left subclavian vascular stent remains in place. Stable
cardiomegaly post mitral valve surgery.

A loculated right pleural effusion has minimally improved. There is
stable partial right lung atelectasis, and no edema or pneumothorax.
Extensive soft tissue calcification anterior to the right shoulder
is again noted, likely reflecting tumoral calcinosis.
IMPRESSION: Interval extubation with mild improvement in the loculated right
pleural effusion. No new findings.

## 2021-02-15 MED ORDER — DEXTROSE 10 % IV SOLN: INTRAVENOUS | Status: DC

## 2021-02-15 MED ORDER — MIDODRINE HCL 5 MG PO TABS: 10.0000 mg | ORAL_TABLET | Freq: Three times a day (TID) | ORAL | Status: DC

## 2021-02-15 MED ORDER — PRISMASOL BGK 0/2.5 32-2.5 MEQ/L EC SOLN
Status: DC
  Filled 2021-02-15 (×7): qty 5000

## 2021-02-15 MED ORDER — DEXTROSE 50 % IV SOLN
INTRAVENOUS | Status: AC
  Administered 2021-02-15: 25 mL
  Filled 2021-02-15: qty 50

## 2021-02-15 MED ORDER — PRISMASOL BGK 0/2.5 32-2.5 MEQ/L EC SOLN
Status: DC
  Filled 2021-02-15 (×16): qty 5000

## 2021-02-15 MED ORDER — SODIUM CHLORIDE 0.9% FLUSH: 10.0000 mL | INTRAVENOUS | Status: DC | PRN

## 2021-02-15 MED ORDER — DEXTROSE 50 % IV SOLN
INTRAVENOUS | Status: AC
  Administered 2021-02-15: 25 mL via INTRAVENOUS
  Filled 2021-02-15: qty 50

## 2021-02-15 MED ORDER — SODIUM CHLORIDE 0.9% FLUSH
10.0000 mL | Freq: Two times a day (BID) | INTRAVENOUS | Status: DC
  Administered 2021-02-15 – 2021-02-19 (×9): 10 mL

## 2021-02-15 MED ORDER — PRISMASOL BGK 0/2.5 32-2.5 MEQ/L EC SOLN
Status: DC
  Filled 2021-02-15 (×4): qty 5000

## 2021-02-15 MED ORDER — DEXTROSE 50 % IV SOLN
12.5000 g | INTRAVENOUS | Status: AC
  Administered 2021-02-15: 12.5 g via INTRAVENOUS

## 2021-02-15 NOTE — Progress Notes (Signed)
[facility] Progress Note Patient Name: Patrick Anthony DOB: 1971/03/08 MRN: [id number]   Date of Service  02/15/2021  HPI/Events of Note  Patient with acute liver injury and hypoglycemia despite D 10 % gtt at 50 ml / hour. D 10 % gtt increased to 100 ml / hour.  eICU Interventions  See above.        Frederik Pear 02/15/2021, 8:42 PM

## 2021-02-15 NOTE — Progress Notes (Signed)
[facility] Progress Note Patient Name: Patrick Anthony DOB: 10-13-1970 MRN: [id number]   Date of Service  02/15/2021  HPI/Events of Note  Patient with acute liver injury and hypoglycemia. Patient trying to climb out of bed with a groin femoral dialysis catheter in place.Patient has had paroxysmal Afib but is in sinus rhythm currently.  eICU Interventions  D 10 % gtt ordered. Bilateral soft wrist restraints + Posey restraints ordered, will monitor heart rhythm closely and treat Afib if it recurs / sustains.        Kerry Kass [name] 02/15/2021, 4:38 AM

## 2021-02-15 NOTE — Progress Notes (Signed)
I have been unable to monitor oxygen saturations with multiple equipment changes I did however obtain ABG this am to verify respiratory status.

## 2021-02-15 NOTE — Progress Notes (Signed)
Maroon and brown colored stool noted ibuprofen have updated Dr Carney Corners.Will continue to monitor

## 2021-02-15 NOTE — Progress Notes (Signed)
Nutrition Follow-up  DOCUMENTATION CODES:   Not applicable  INTERVENTION:   Recommend Cortrak NG tube placement on 7/13 and initiation of enteral nutrition: - Start Pivot 1.5 @ 25 ml/hr and advance by 10 ml q 6 hours to goal rate of 65 ml/hr (1560 ml/day)  Recommended tube feeding regimen provides 2340 kcal, 146 grams of protein, and 1184 ml of H2O.   - Continue B-complex with vitamin C daily to account for losses with CRRT  NUTRITION DIAGNOSIS:   Increased nutrient needs related to acute illness (COVID-19 pneumonia, septic shock requiring CRRT) as evidenced by estimated needs.  Ongoing  GOAL:   Patient will meet greater than or equal to 90% of their needs  Unmet at this time  MONITOR:   Diet advancement, Labs, Weight trends, TF tolerance, I & O's  REASON FOR ASSESSMENT:   Ventilator, Consult Enteral/tube feeding initiation and management  ASSESSMENT:   50 year old male who presented to the ED on 7/10 from SNF after being found unresponsive. Pt required intubation in the ED. PMH of HTN, CHF, endocarditis and discitis with recent bacteremia, ESRD on HD, BKA. Pt admitted with acute respiratory failure with hypoxia due to pneumonia as well as septic shock. Pt also found to be COVID-19 positive.  7/10 - CRRT initiated 7/11 - self-extubated  Discussed pt with RN and during ICU rounds. Pt self-extubated yesterday and is NPO. CCM recommending SLP consult. Recommend placement of Cortrak tube tomorrow and initiation of enteral nutrition as pt with very high nutrition needs related to COVID and CRRT. Pt remains on CRRT for volume removal.  Per CCM, volume overload is related to MV dehiscence and pt is not a candidate for replacement. Palliative Care is following pt.  Pt remains on pressor support with levophed and vasopressin.  EDW: 66.5 kg Admit weight: 65 kg Current weight: 83.1 kg  Medications reviewed and include: B-complex with vitamin C, IV solu-cortef, SSI q 4 hours,  IV protonix, miralax, IV abx, precedex gtt, levophed gtt, vasopressin gtt  IVF: D10 @ 50 ml/hr (provides 408 kcal daily)  Labs reviewed: sodium 133, ionized calcium 0.96, elevated LFTs, lactic acid >11.0 CBG's: 54-126 x 24 hours  CRRT UF: 2996 ml x 24 hours I/O's: +4.1 L since admit  Diet Order:   Diet Order             Diet NPO time specified  Diet effective now                   EDUCATION NEEDS:   Not appropriate for education at this time  Skin:  Skin Assessment: Reviewed RN Assessment  Last BM:  02/15/21 type 7  Height:   Ht Readings from Last 1 Encounters:  02/14/21 '5\' 11"'$  (1.803 m)    Weight:   Wt Readings from Last 1 Encounters:  02/15/21 83.1 kg    Ideal Body Weight:  73.1 kg (adjusted for R BKA)  BMI:  Body mass index is 25.55 kg/m.  Adjusted BMI using EDW: 21.8 kg/m  Estimated Nutritional Needs:   Kcal:  2300-2500  Protein:  130-150 grams  Fluid:  1000 ml + UOP    Gustavus Bryant, MS, RD, LDN Inpatient Clinical Dietitian Please see AMiON for contact information.

## 2021-02-15 NOTE — Progress Notes (Addendum)
NAME:  Dontra Stoneking, MRN:  [id number], DOB:  24-Nov-1970, LOS: 2 ADMISSION DATE:  02/23/2021, CONSULTATION DATE:  02/28/2021 REFERRING MD:  Dina Rich- TRH, CHIEF COMPLAINT:  respiratory failure on vent   History of Present Illness:  Mr. Wilder is a 50 y/o gentleman with a history of endocarditis and diskitis with recent bacteremia, ESRD who presented from [place] with hypoglycemia after he was found unresponsive this morning. He was diaphoretic but did not regain consciousness when given supplemental dextrose. He developed progressive hypoxia and required intubation. He has a history of MRSA aortic endocarditis, diskitis, and bacteremia in February & June 2022, bioprosthetic MVR, HFpEF, chronic PH, ESRD on HD, BKA. He was discharged on vancomycin; last dose unknown. History obtained from chart review due to patient's encephalopathy. Received ceftriaxone and azithromycin in the ED and was started on vasopressors for shock.  Pertinent  Medical History  MRSA aortic endocarditis, diskitis, and bacteremia in June 2022 bioprosthetic MVR, HFpEF, chronic PH, PFI ESRD on HD BKA Cirrhosis OSA  Significant Hospital Events: Including procedures, antibiotic start and stop dates in addition to other pertinent events   7/10 admitted, intubated, on pressors 7/11 self extubated, off sedation  Interim History / Subjective:  Patient laying in bed on ventilation. Sedated, recent dose of fentanyl and midazolam for agitation. Was following commands prior to medications per RN.  Objective   Blood pressure 121/90, pulse (!) 103, temperature (!) 96.1 F (35.6 C), temperature source Other (Comment), resp. rate 15, height '5\' 11"'$  (1.803 m), weight 83.1 kg, SpO2 (!) 89 %.    Vent Mode: PRVC FiO2 (%):  [30 %-40 %] 30 % Set Rate:  [20 bmp] 20 bmp Vt Set:  [450 mL] 450 mL PEEP:  [5 cmH20] 5 cmH20 Plateau Pressure:  [20 cmH20-22 cmH20] 20 cmH20   Intake/Output Summary (Last 24 hours) at 02/15/2021 0715 Last data  filed at 02/15/2021 0600 Gross per 24 hour  Intake 1726.46 ml  Output 2869 ml  Net -1142.54 ml    Filed Weights   03/03/2021 1130 02/14/21 0500 02/15/21 0237  Weight: 65 kg 83 kg 83.1 kg    Examination: General: critically ill appearing man, sleeping  HENT: [place]/AT, eyes anicteric Lungs: decreased R lateral breath sounds, CTA on left Cardiovascular: normal rate, s1, s2 present Abdomen: soft, mild distended, active BS Extremities: fistula LUE positive bruit, BKA RLE warm pulses intact Neuro: RASS -2, briefly awakens to voice. Nods his head occassionally to questions Skin: no rashes or ecchymoses  Resolved Hospital Problem list     Assessment & Plan:  Acute respiratory failure with hypoxia  CAP with R parapneumonic effusion COVID pneumonia Multifactorial due to volume overload, right sided pneumonia and COVID. Self extubated yesterday afternoon. Low O2 saturations this morning, but poor waveform. Normal PO2 on abg this morning. -CXR today -continue remdesivir, ceftriaxone, and azithromycin  -continue hydrocortisone 50 q6 -pulmonary hygiene  -O2 to maintain saturation >90%  Septic shock due to pneumonia WBC of 2201 this morning. Lactic acid remains elevated. Blood cultures remain negative -vasopressin, norepinephrine to maintain MAP>65 -midodrine 10 mg three times daily if he passes swallow eval -Continue antibiotics -stress dose steroids -monitor CBC, fever curve  MRSA endocarditis with stenosis and fracture/perforation the mitral prosthesis Volume overload Not a candidate for replacement. Likely contributing to volume overload and acute hypoxic respiratory failure.  -CRRT per nephrology -palliative care consult  Acute metabolic encephalopathy Metabolic acidosis likely lactic acidosis secondary to sepsis Prior history of septic emboli in brain. Hypoglycemic to  54 this morning -Precedex  -minimize sedation   Hypoglycemia due to sepsis No longer getting tube feeds due  to self extubation yesterday -CBG monitoring q4 -SSI sensitive -Swallow evaluation, consider cortrak tomorrow if unable to swallow  ESRD on HD Hyperkalemia due to metabolic acidosis-resolved ~3L removed on 7/11.  -nephrology consult; has femoral access -Continue CRRT  Acute liver injury  AST elevated above 2000 and ALT of 618 this morning. Elevated PT INR. MELD 32. Likely shock liver in setting of sepsis with history of cirrhosis from chronic untreated hepatitis C. - continue to monitor   Chronic anemia due to renal failure Acute thrombocytopenia likely due to sepsis  Bleeding from femoral line after patient pulled it out overnight hgb drop to 9.9 this morning, some oozing from femoral line.  -hold heparin PPX today -transfuse for Hb<7 or hemodynamically significant bleeding -transfuse for platelets <10 or <50 with bleeding  Best Practice (right click and "Reselect all SmartList Selections" daily)  Diet/type: NPO pending SLP eval DVT prophylaxis: not indicated GI prophylaxis: PPI Lines: Central line and Arterial Line Foley:  N/A Code Status:  DNR Last date of multidisciplinary goals of care discussion [7/10- daughter [name] ]  Labs   CBC: Recent Labs  Lab 02/19/2021 [phone]  0948 02/27/2021 1936 02/14/21 0354 02/14/21 0435 02/14/21 0743 02/14/21 1046 02/15/21 0225  WBC 4.6  --   --  14.1*  --   --   --  22.1*  NEUTROABS 3.2  --   --   --   --   --   --   --   HGB 10.3*   < >  --  10.5* 12.6* 11.6* 12.2* 9.9*  HCT 36.8*   < >  --  35.9* 37.0* 34.0* 36.0* 33.7*  MCV 95.8  --   --  91.6  --   --   --  91.1  PLT 54*  --  47* 65*  --   --   --  78*   < > = values in this interval not displayed.     Basic Metabolic Panel: Recent Labs  Lab 02/15/2021 0938 02/14/2021 0948 02/11/2021 1218 02/04/2021 1554 02/14/21 0354 02/14/21 0435 02/14/21 0743 02/14/21 1046 02/14/21 1643 02/15/21 0225  NA 134* 131*  132*   < > 136 132* 132* 132* 134* 132* 132*  K 6.0* 5.9*  6.0*    < > 6.1* 4.5 4.2 3.5 3.7 4.3 4.8  CL 93* 98  --  94* 90*  --   --   --  97* 99  CO2 15*  --   --  10* 16*  --   --   --  21* 13*  GLUCOSE 60* 74  --  126* 205*  --   --   --  87 58*  BUN 44* 46*  --  42* 35*  --   --   --  25* 18  CREATININE 6.55* 7.00*  --  6.09* 5.19*  --   --   --  3.19* 2.68*  CALCIUM 7.3*  --   --  7.7* 7.6*  --   --   --  7.2* 7.4*  MG  --   --   --   --  1.9  --   --   --   --  2.5*  PHOS  --   --   --  6.4* 5.4*  --   --   --  4.4 4.6   < > = values  in this interval not displayed.    GFR: Estimated Creatinine Clearance: 35.5 mL/min (A) (by C-G formula based on SCr of 2.68 mg/dL (H)). Recent Labs  Lab 02/08/2021 0938 02/07/2021 1554 02/09/2021 1936 02/14/21 0354 02/15/21 0225 02/15/21 0440  WBC 4.6  --   --  14.1* 22.1*  --   LATICACIDVEN  --  >11.0* >11.0* >11.0*  --  >11.0*     Liver Function Tests: Recent Labs  Lab 02/21/2021 0938 03/06/2021 1554 02/14/21 0354 02/14/21 1643 02/15/21 0225  AST 88*  --   --   --  2,004*  ALT 25  --   --   --  618*  ALKPHOS 86  --   --   --  104  BILITOT 3.5*  --   --   --  4.5*  PROT 7.9  --   --   --  7.0  ALBUMIN 2.2* 1.9* 1.9* 1.9* 2.0*  2.0*    No results for input(s): LIPASE, AMYLASE in the last 168 hours. No results for input(s): AMMONIA in the last 168 hours.  ABG    Component Value Date/Time   PHART 7.576 (H) 02/14/2021 1046   PCO2ART 27.3 (L) 02/14/2021 1046   PO2ART 112 (H) 02/14/2021 1046   HCO3 25.5 02/14/2021 1046   TCO2 26 02/14/2021 1046   ACIDBASEDEF 5.0 (H) 02/14/2021 0435   O2SAT 99.0 02/14/2021 1046      Coagulation Profile: Recent Labs  Lab 02/09/2021 1936 02/15/21 0225  INR 2.4* 3.1*     Cardiac Enzymes: No results for input(s): CKTOTAL, CKMB, CKMBINDEX, TROPONINI in the last 168 hours.  HbA1C: Hgb A1c MFr Bld  Date/Time Value Ref Range Status  09/29/2020 04:35 AM 5.5 4.8 - 5.6 % Final    Comment:    (NOTE) Pre diabetes:          5.7%-6.4%  Diabetes:               >6.4%  Glycemic control for   <7.0% adults with diabetes   09/28/2020 08:27 AM 5.4 4.8 - 5.6 % Final    Comment:    (NOTE) Pre diabetes:          5.7%-6.4%  Diabetes:              >6.4%  Glycemic control for   <7.0% adults with diabetes     CBG: Recent Labs  Lab 02/14/21 1521 02/14/21 1947 02/14/21 2338 02/15/21 0350 02/15/21 0406  GLUCAP 92 94 89 54* 115*     Review of Systems:   Unable to be obtained due to encephalopathy.  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   Surgical History:   Past Surgical History:  Procedure Laterality Date   AMPUTATION Right 10/29/2020   Procedure: RIGHT ABOVE KNEE AMPUTATION;  Surgeon: [name], MD;  Location: [place];  Service: Orthopedics;  Laterality: Right;   AV FISTULA PLACEMENT Right    AV FISTULA PLACEMENT Left 09/03/2013   Procedure: ARTERIOVENOUS (AV) FISTULA CREATION - LEFT BRACHIOCEPHALIC;  Surgeon: Conrad Ethel, MD;  Location: [place];  Service: Vascular;  Laterality: Left;   AV FISTULA  PLACEMENT Left 10/30/2019   Procedure: INSERTION OF ARTERIOVENOUS (AV) GORE-TEX GRAFT ARM;  Surgeon: Rosetta Posner, MD;  Location: [place];  Service: Vascular;  Laterality: Left;   BUBBLE STUDY  10/05/2020   Procedure: BUBBLE STUDY;  Surgeon: Pixie Casino, MD;  Location: [place];  Service: Cardiovascular;;   I & D EXTREMITY Right 10/20/2020   Procedure: IRRIGATION AND DEBRIDEMENT OF LEG;  Surgeon: [name], MD;  Location: [place];  Service: Orthopedics;  Laterality: Right;    INSERTION OF DIALYSIS CATHETER N/A 08/29/2013   Procedure: INSERTION OF DIALYSIS CATHETER;  Surgeon: Mal Misty, MD;  Location: [place];  Service: Vascular;  Laterality: N/A;   INSERTION OF DIALYSIS CATHETER Left 10/30/2019   Procedure: INSERTION OF DIALYSIS CATHETER LEFT INTERNAL JUGULAR;  Surgeon: Rosetta Posner, MD;  Location: [place];  Service: Vascular;  Laterality: Left;   LEFT HEART CATH AND CORONARY ANGIOGRAPHY N/A 10/07/2020   Procedure: LEFT HEART CATH AND CORONARY ANGIOGRAPHY;  Surgeon: Lorretta Harp, MD;  Location: [place] [facility];  Service: Cardiovascular;  Laterality: N/A;   MITRAL VALVE REPAIR Right 10/14/2020   Procedure: MINIMALLY INVASIVE MITRAL VALVE  REPLACEMENT(MVR) USING MEDTRONIC MOSAIC VALVE SIZE 31MM;  Surgeon: Rexene Alberts, MD;  Location: [place];  Service: Open Heart Surgery;  Laterality: Right;   MULTIPLE EXTRACTIONS WITH ALVEOLOPLASTY N/A 10/12/2020   Procedure: MULTIPLE EXTRACTION WITH ALVEOLOPLASTY;  Surgeon: [name], DMD;  Location: [place];  Service: Dentistry;  Laterality: N/A;   PERIPHERAL VASCULAR CATHETERIZATION N/A 01/06/2015   Procedure: A/V Shuntogram/Fistulagram;  Surgeon: Katha Cabal, MD;  Location: [place] [facility];  Service: Cardiovascular;  Laterality: N/A;   PERIPHERAL VASCULAR CATHETERIZATION Left 01/06/2015   Procedure: A/V Shunt Intervention;  Surgeon: Katha Cabal, MD;  Location: [place] [facility];  Service: Cardiovascular;  Laterality: Left;   REPAIR OF PATENT FORAMEN OVALE N/A 10/14/2020   Procedure: CLOSURE OF PATENT FORAMEN OVALE;  Surgeon: Rexene Alberts, MD;  Location: [place];  Service: Open Heart Surgery;  Laterality: N/A;   TEE WITHOUT CARDIOVERSION N/A 10/05/2020   Procedure: TRANSESOPHAGEAL ECHOCARDIOGRAM (TEE);  Surgeon: Pixie Casino, MD;  Location: [place];  Service: Cardiovascular;  Laterality: N/A;   TEE WITHOUT CARDIOVERSION N/A 10/14/2020   Procedure: TRANSESOPHAGEAL ECHOCARDIOGRAM (TEE);  Surgeon:  Rexene Alberts, MD;  Location: [place];  Service: Open Heart Surgery;  Laterality: N/A;   TEE WITHOUT CARDIOVERSION N/A 12/24/2020   Procedure: TRANSESOPHAGEAL ECHOCARDIOGRAM (TEE);  Surgeon: Pixie Casino, MD;  [facility];  Service: Cardiovascular;  Laterality: N/A;     Social History:   reports that he quit smoking about 3 years ago. His smoking use included cigarettes. He smoked an average of 0.20 packs per day. He has never used smokeless tobacco. He reports that he does not drink alcohol and does not use drugs.   Family History:  His family history includes Cancer in his mother; Hypertension in his mother.   Allergies No Known Allergies   Home Medications  Prior to Admission medications   Medication Sig Start Date End Date Taking? Authorizing Provider  acetaminophen (TYLENOL) 325 MG tablet Take 325-650 mg by mouth every 6 (six) hours as needed for mild pain or fever. Temp > 100.5    [provider]  aspirin EC 325 MG EC tablet Take 1 tablet (325 mg total) by mouth daily. 11/10/20   Samella Parr, NP  atorvastatin (LIPITOR) 40 MG tablet Take 1  tablet (40 mg total) by mouth daily. 01/27/21   Dessa Phi, DO  bisacodyl (DULCOLAX) 10 MG suppository Place 1 suppository (10 mg total) rectally daily as needed for moderate constipation. 11/09/20   Samella Parr, NP  calcium acetate (PHOSLO) 667 MG capsule Take 667 mg by mouth 3 (three) times daily with meals.    [provider]  cinacalcet (SENSIPAR) 30 MG tablet Take 2 tablets (60 mg total) by mouth daily with breakfast. 11/10/20   Samella Parr, NP  Darbepoetin Alfa (ARANESP, ALBUMIN FREE, IJ) Darbepoetin Alfa (Aranesp) 07/05/20 07/04/21  [provider]  diltiazem (CARDIZEM CD) 120 MG 24 hr capsule Take 1 capsule (120 mg total) by mouth daily. 01/07/21   Ghimire, Henreitta Leber, MD  docusate sodium (COLACE) 100 MG capsule Take 1 capsule (100 mg total) by mouth 2 (two) times daily. 11/09/20   Samella Parr, NP  methocarbamol (ROBAXIN) 500 MG tablet Take 1 tablet (500 mg total) by mouth 3 (three) times daily. 11/09/20   Samella Parr, NP  metoprolol tartrate (LOPRESSOR) 50 MG tablet Take 0.5 tablets (25 mg total) by mouth 2 (two) times daily. 01/06/21   Ghimire, Henreitta Leber, MD  Multiple Vitamin (MULTIVITAMIN WITH MINERALS) TABS tablet Take 1 tablet by mouth daily.    [provider]  nitroGLYCERIN (NITROSTAT) 0.4 MG SL tablet Place 0.4 mg under the tongue every 5 (five) minutes x 3 doses as needed for chest pain.     [provider]  Nutritional Supplements (,FEEDING SUPPLEMENT, PROSOURCE PLUS) liquid Take 30 mLs by mouth 2 (two) times daily between meals. 11/09/20   Samella Parr, NP  Nutritional Supplements (FEEDING SUPPLEMENT, NEPRO CARB STEADY,) LIQD Take 237 mLs by mouth 3 (three) times daily between meals. 11/09/20   Samella Parr, NP  pantoprazole (PROTONIX) 40 MG tablet Take 1 tablet (40 mg total) by mouth daily. 11/10/20   Samella Parr, NP  polyethylene glycol (MIRALAX / GLYCOLAX) 17 g packet Take 17 g by mouth daily as needed for mild constipation. 11/09/20   Samella Parr, NP  pregabalin (LYRICA) 75 MG capsule Take 1 capsule (75 mg total) by mouth at bedtime. 11/09/20   Samella Parr, NP  senna-docusate (SENOKOT-S) 8.6-50 MG tablet Take 1 tablet by mouth 2 (two) times daily. 11/09/20   Samella Parr, NP  sevelamer carbonate (RENVELA) 800 MG tablet Take 1,600 mg by mouth 3 (three) times daily with meals.    [provider]  Vancomycin (VANCOCIN) 750-5 MG/150ML-% SOLN Inject 150 mLs (750 mg total) into the vein every Monday, Wednesday, and Friday with hemodialysis. 6 weeks of vancomycin from 5/27 12/31/20   Jonetta Osgood, MD     Iona Beard, MD 02/15/21 7:15 AM Internal Medicine, PGY-2 Pager [phone]

## 2021-02-15 NOTE — Progress Notes (Signed)
I have decreased Precedex several times however patient becomes restless and throws his left leg over the siderails He remains disoriented and repeats one word that is not any type of appropriate response He continues to wake with startled look and I am unable to redirect him. I have increased his levophed this AM for Blood pressure support however he continues to have blood pressures in 80"s and I am limited on fluid removal with CRRT.He is currently in and out of Afib with rate 109 . Dr Carney Corners is aware of these changes.

## 2021-02-15 NOTE — Progress Notes (Signed)
[facility] Progress Note  Subjective: Patient not seen directly today given COVID-19 + status, utilizing data taken from chart +/- discussions w/ providers and staff  I/O neg 800 cc yest   Vitals:   02/15/21 1645 02/15/21 1700 02/15/21 1715 02/15/21 1730  BP:      Pulse: (!) 119 (!) 109 99 100  Resp: _0 (!) 40  Temp:      TempSrc:      SpO2: 100% 97% 98% 100%  Weight:      Height:        Exam: Patient not seen directly today given COVID-19 + status, utilizing data taken from chart +/- discussions w/ providers and staff.      [facility] HD[facility]   4h  350/500  66.5kg  2/2 bath  P1  AVF  - mircera 150 ug q2 wks   Assessment/ Plan: ESRD - usual HD MWF. Getting CRRT here started 7/10. Continue.  Septic shock - secondary to pneumonia; pressor support and abx per ccm DNR - pt self extubated yesterday COVID-19 positive - per CCM AHRF - 2/2 pneumonia and parapneumonic effusion, on vent Volume/ hypertension: EDW 66.5kg. 2 pressors + crrt as above. Wt's are off.  Anemia ckd: Hemoglobin 12.6. Currently receiving mircera 116mg. Last dose 6/27 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, resume binders when taking PO      Rob Humaira Sculley 02/15/2021, 5:43 PM   Recent Labs  Lab 02/15/21 0225 02/15/21 0951 02/15/21 1522  K 4.8 4.6 4.9  BUN 18  --  15  CREATININE 2.68*  --  2.09*  CALCIUM 7.4*  --  7.6*  PHOS 4.6  --  4.1  HGB 9.9* 13.3  --     Inpatient medications:  B-complex with vitamin C  1 tablet Per Tube Daily   chlorhexidine gluconate (MEDLINE KIT)  15 mL Mouth Rinse BID   Chlorhexidine Gluconate Cloth  6 each Topical Daily   hydrocortisone sod succinate (SOLU-CORTEF) inj  50 mg Intravenous Q6H   insulin aspart  0-6 Units Subcutaneous Q4H   mouth rinse  15 mL Mouth Rinse 10 times per day   midodrine  10 mg Oral TID WC   pantoprazole (PROTONIX) IV  40 mg Intravenous QHS   polyethylene glycol  17 g Per Tube Daily   sodium chloride flush  10-40 mL Intracatheter Q12H     azithromycin Stopped (02/15/21 1145)   cefTRIAXone (ROCEPHIN)  IV Stopped (02/15/21 1223)   dexmedetomidine (PRECEDEX) IV infusion 0.8 mcg/kg/hr (02/15/21 1700)   dextrose 50 mL/hr at 02/15/21 1700   fentaNYL infusion INTRAVENOUS Stopped (02/14/21 1449)   norepinephrine (LEVOPHED) Adult infusion 12 mcg/min (02/15/21 1700)   phenylephrine (NEO-SYNEPHRINE) Adult infusion Stopped (02/21/2021 1414)   prismasol BGK 2/2.5 dialysis solution     prismasol BGK 2/2.5 replacement solution     prismasol BGK 2/2.5 replacement solution     vancomycin Stopped (02/14/21 2322)   vasopressin 0.03 Units/min (02/15/21 1700)   docusate sodium, fentaNYL, fentaNYL (SUBLIMAZE) injection, fentaNYL (SUBLIMAZE) injection, heparin, midazolam, polyethylene glycol, sodium chloride flush

## 2021-02-16 ENCOUNTER — Inpatient Hospital Stay (HOSPITAL_COMMUNITY): Payer: Medicaid Other

## 2021-02-16 DIAGNOSIS — G9340 Encephalopathy, unspecified: Secondary | ICD-10-CM | POA: Diagnosis not present

## 2021-02-16 DIAGNOSIS — K7200 Acute and subacute hepatic failure without coma: Secondary | ICD-10-CM

## 2021-02-16 DIAGNOSIS — R4189 Other symptoms and signs involving cognitive functions and awareness: Secondary | ICD-10-CM | POA: Diagnosis not present

## 2021-02-16 DIAGNOSIS — Z452 Encounter for adjustment and management of vascular access device: Secondary | ICD-10-CM | POA: Diagnosis not present

## 2021-02-16 DIAGNOSIS — J9601 Acute respiratory failure with hypoxia: Secondary | ICD-10-CM | POA: Diagnosis not present

## 2021-02-16 DIAGNOSIS — Z515 Encounter for palliative care: Secondary | ICD-10-CM | POA: Diagnosis not present

## 2021-02-16 DIAGNOSIS — N186 End stage renal disease: Secondary | ICD-10-CM | POA: Diagnosis not present

## 2021-02-16 LAB — CBC
HCT: 32.6 % — ABNORMAL LOW (ref 39.0–52.0)
Hemoglobin: 9.7 g/dL — ABNORMAL LOW (ref 13.0–17.0)
MCH: 26.8 pg (ref 26.0–34.0)
MCHC: 29.8 g/dL — ABNORMAL LOW (ref 30.0–36.0)
MCV: 90.1 fL (ref 80.0–100.0)
Platelets: 69 10*3/uL — ABNORMAL LOW (ref 150–400)
RBC: 3.62 MIL/uL — ABNORMAL LOW (ref 4.22–5.81)
RDW: 24 % — ABNORMAL HIGH (ref 11.5–15.5)
WBC: 19.2 10*3/uL — ABNORMAL HIGH (ref 4.0–10.5)
nRBC: 4.2 % — ABNORMAL HIGH (ref 0.0–0.2)

## 2021-02-16 LAB — MAGNESIUM: Magnesium: 2.6 mg/dL — ABNORMAL HIGH (ref 1.7–2.4)

## 2021-02-16 LAB — RENAL FUNCTION PANEL
Albumin: 2.1 g/dL — ABNORMAL LOW (ref 3.5–5.0)
Albumin: 2.1 g/dL — ABNORMAL LOW (ref 3.5–5.0)
Anion gap: 17 — ABNORMAL HIGH (ref 5–15)
Anion gap: 18 — ABNORMAL HIGH (ref 5–15)
BUN: 12 mg/dL (ref 6–20)
BUN: 9 mg/dL (ref 6–20)
CO2: 14 mmol/L — ABNORMAL LOW (ref 22–32)
CO2: 11 mmol/L — ABNORMAL LOW (ref 22–32)
Calcium: 7.5 mg/dL — ABNORMAL LOW (ref 8.9–10.3)
Calcium: 7.4 mg/dL — ABNORMAL LOW (ref 8.9–10.3)
Chloride: 98 mmol/L (ref 98–111)
Chloride: 99 mmol/L (ref 98–111)
Creatinine, Ser: 1.68 mg/dL — ABNORMAL HIGH (ref 0.61–1.24)
Creatinine, Ser: 1.45 mg/dL — ABNORMAL HIGH (ref 0.61–1.24)
GFR, Estimated: 50 mL/min — ABNORMAL LOW (ref >60)
GFR, Estimated: 59 mL/min — ABNORMAL LOW (ref >60)
Glucose, Bld: 155 mg/dL — ABNORMAL HIGH (ref 70–99)
Glucose, Bld: 147 mg/dL — ABNORMAL HIGH (ref 70–99)
Phosphorus: 3.3 mg/dL (ref 2.5–4.6)
Phosphorus: 3.3 mg/dL (ref 2.5–4.6)
Potassium: 3.7 mmol/L (ref 3.5–5.1)
Potassium: 3.4 mmol/L — ABNORMAL LOW (ref 3.5–5.1)
Sodium: 129 mmol/L — ABNORMAL LOW (ref 135–145)
Sodium: 128 mmol/L — ABNORMAL LOW (ref 135–145)

## 2021-02-16 LAB — HEPATIC FUNCTION PANEL
ALT: 724 U/L — ABNORMAL HIGH (ref 0–44)
AST: 2134 U/L — ABNORMAL HIGH (ref 15–41)
Albumin: 2.1 g/dL — ABNORMAL LOW (ref 3.5–5.0)
Alkaline Phosphatase: 106 U/L (ref 38–126)
Bilirubin, Direct: 3.3 mg/dL — ABNORMAL HIGH (ref 0.0–0.2)
Indirect Bilirubin: 1.8 mg/dL — ABNORMAL HIGH (ref 0.3–0.9)
Total Bilirubin: 5.1 mg/dL — ABNORMAL HIGH (ref 0.3–1.2)
Total Protein: 7.1 g/dL (ref 6.5–8.1)

## 2021-02-16 LAB — POCT I-STAT 7, (LYTES, BLD GAS, ICA,H+H)
Acid-base deficit: 12 mmol/L — ABNORMAL HIGH (ref 0.0–2.0)
Bicarbonate: 12 mmol/L — ABNORMAL LOW (ref 20.0–28.0)
Calcium, Ion: 0.95 mmol/L — ABNORMAL LOW (ref 1.15–1.40)
HCT: 38 % — ABNORMAL LOW (ref 39.0–52.0)
Hemoglobin: 12.9 g/dL — ABNORMAL LOW (ref 13.0–17.0)
O2 Saturation: 100 %
Patient temperature: 94
Potassium: 3.4 mmol/L — ABNORMAL LOW (ref 3.5–5.1)
Sodium: 131 mmol/L — ABNORMAL LOW (ref 135–145)
TCO2: 13 mmol/L — ABNORMAL LOW (ref 22–32)
pCO2 arterial: 20.3 mmHg — ABNORMAL LOW (ref 32.0–48.0)
pH, Arterial: 7.367 (ref 7.350–7.450)
pO2, Arterial: 172 mmHg — ABNORMAL HIGH (ref 83.0–108.0)

## 2021-02-16 LAB — GLUCOSE, CAPILLARY
Glucose-Capillary: 127 mg/dL — ABNORMAL HIGH (ref 70–99)
Glucose-Capillary: 135 mg/dL — ABNORMAL HIGH (ref 70–99)
Glucose-Capillary: 135 mg/dL — ABNORMAL HIGH (ref 70–99)
Glucose-Capillary: 149 mg/dL — ABNORMAL HIGH (ref 70–99)
Glucose-Capillary: 153 mg/dL — ABNORMAL HIGH (ref 70–99)
Glucose-Capillary: 173 mg/dL — ABNORMAL HIGH (ref 70–99)
Glucose-Capillary: 34 mg/dL — CL (ref 70–99)
Glucose-Capillary: 93 mg/dL (ref 70–99)

## 2021-02-16 LAB — PROTIME-INR
INR: 4.1 (ref 0.8–1.2)
Prothrombin Time: 39.8 seconds — ABNORMAL HIGH (ref 11.4–15.2)

## 2021-02-16 LAB — LACTIC ACID, PLASMA: Lactic Acid, Venous: >11 mmol/L (ref 0.5–1.9)

## 2021-02-16 LAB — FIBRINOGEN: Fibrinogen: 192 mg/dL — ABNORMAL LOW (ref 210–475)

## 2021-02-16 IMAGING — DX DG ABD PORTABLE 1V
1 series · 1 of 1 positions shown · non-contrast
Comparison: Abdominal radiograph [DATE]

CLINICAL DATA: Feeding tube placement.

EXAM:
PORTABLE ABDOMEN - 1 VIEW

[abdomen]
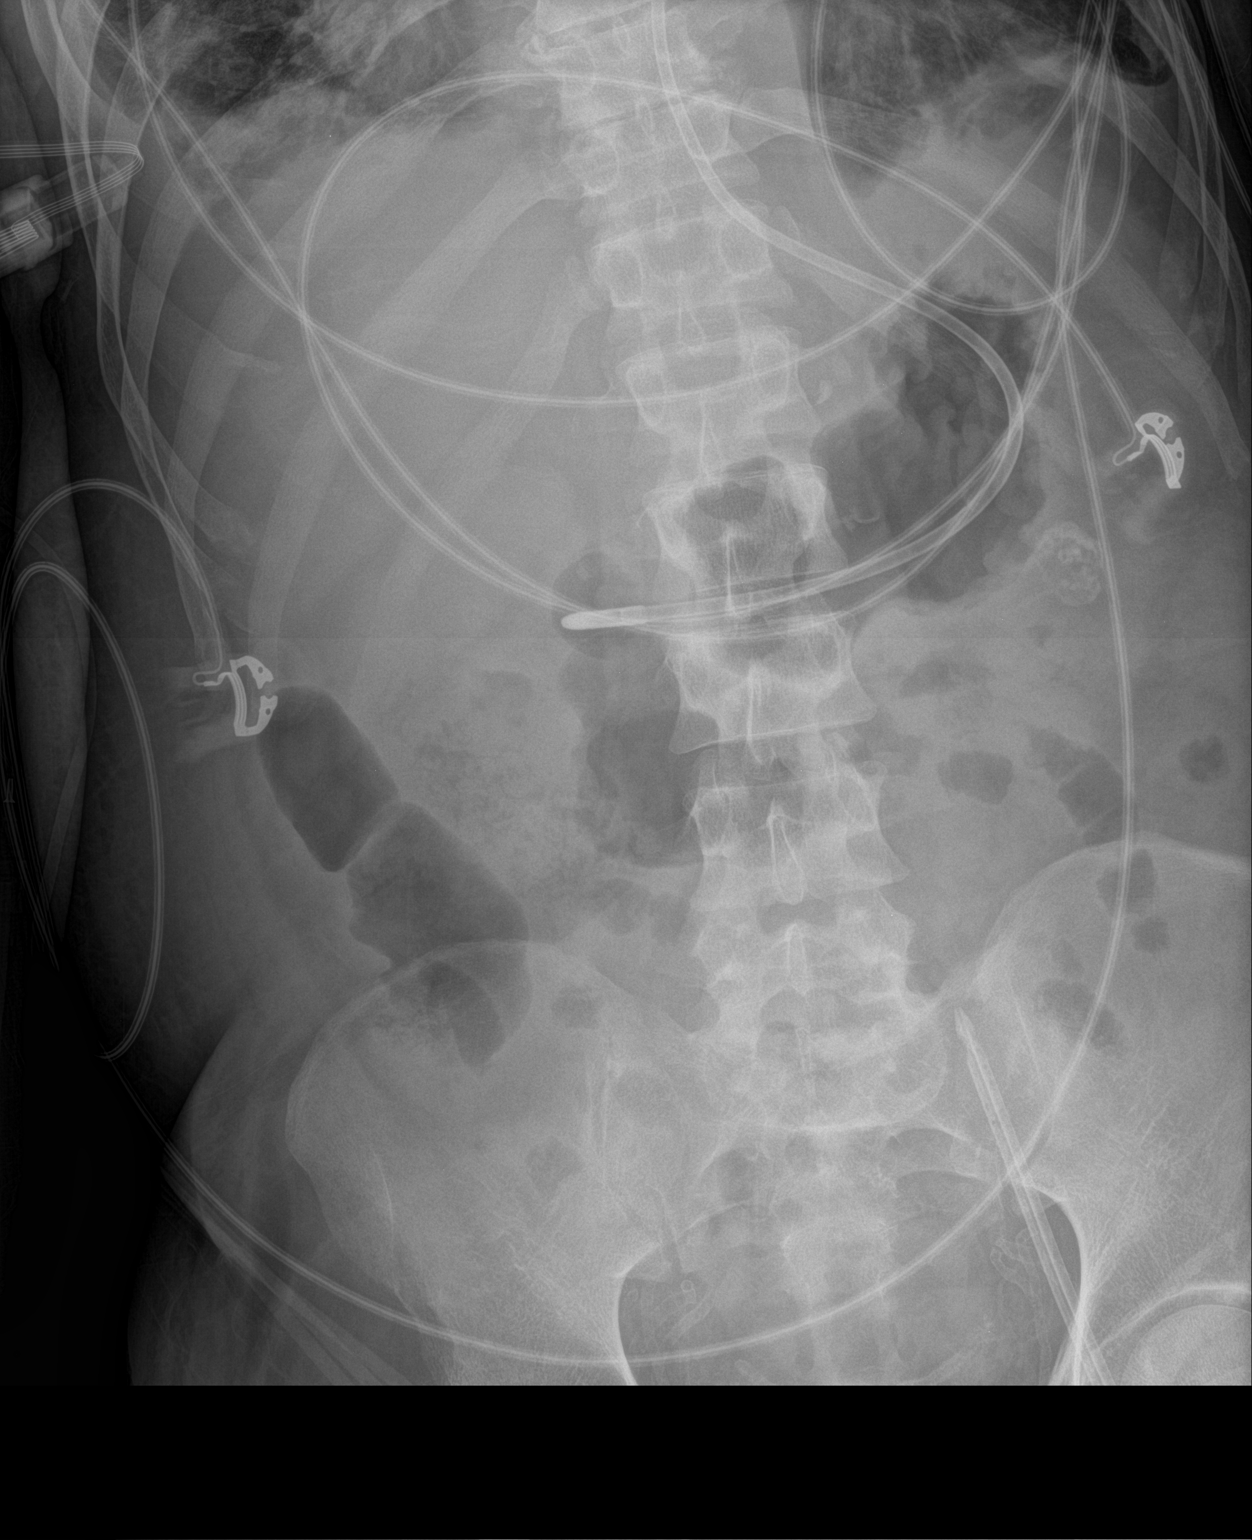

[1 of 1 positions shown; findings below may reference images not displayed]

FINDINGS: The feeding tube tip is in the antropyloric region of the stomach.

Stable unremarkable bowel gas pattern.

Stable left renal calcification.

Left femoral catheters are noted.
IMPRESSION: Feeding tube tip is in the antropyloric region of the stomach.

## 2021-02-16 IMAGING — US US HEPATIC LIVER DOPPLER
1 series · 14 of 25 positions shown · non-contrast
Comparison: CT abdomen and pelvis [DATE]

CLINICAL DATA: Liver failure

EXAM:
DUPLEX ULTRASOUND OF LIVER
TECHNIQUE: Color and duplex Doppler ultrasound was performed to evaluate the
hepatic in-flow and out-flow vessels.

[Series 1: us liver doppler · 14 of 101 slices shown]
[im 1/101]
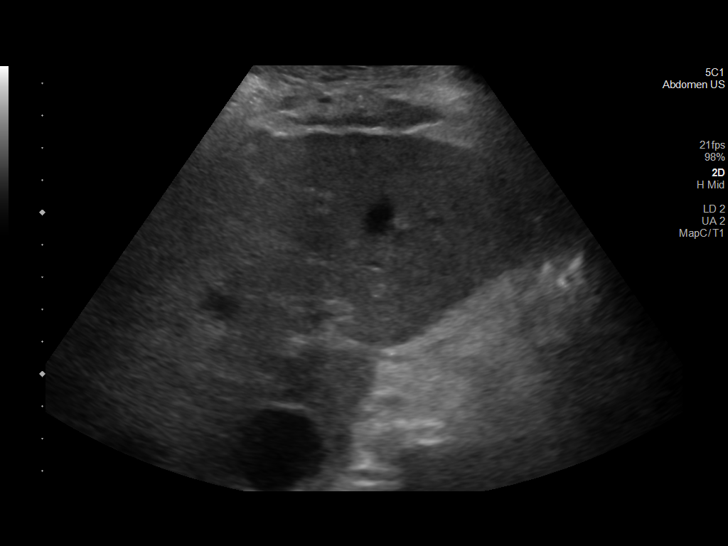
[im 9/101]
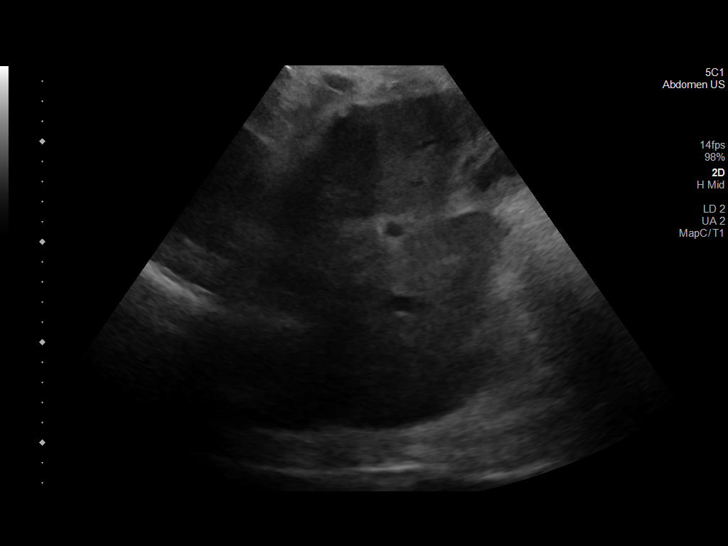
[im 17/101]
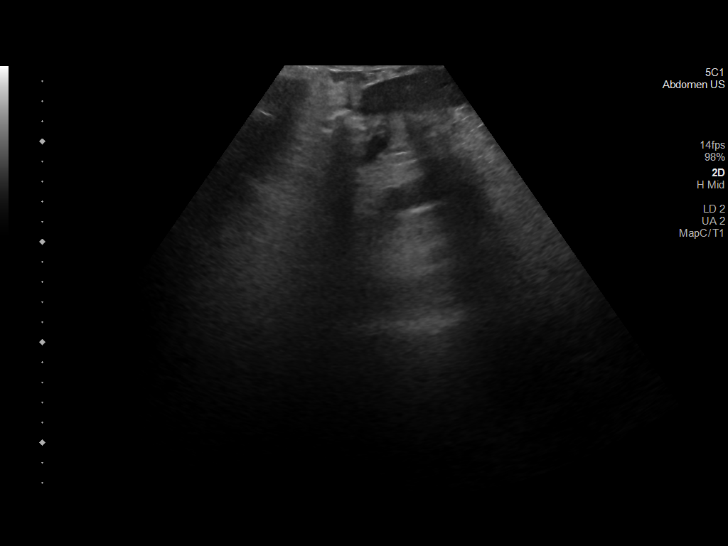
[im 26/101]
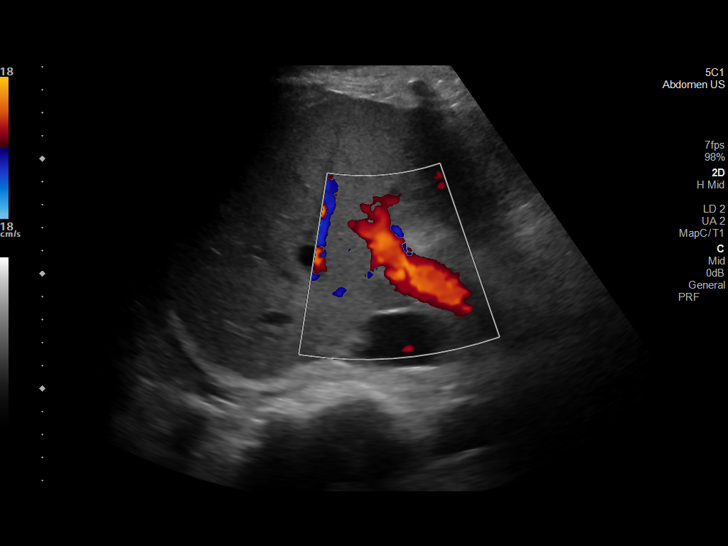
[im 34/101]
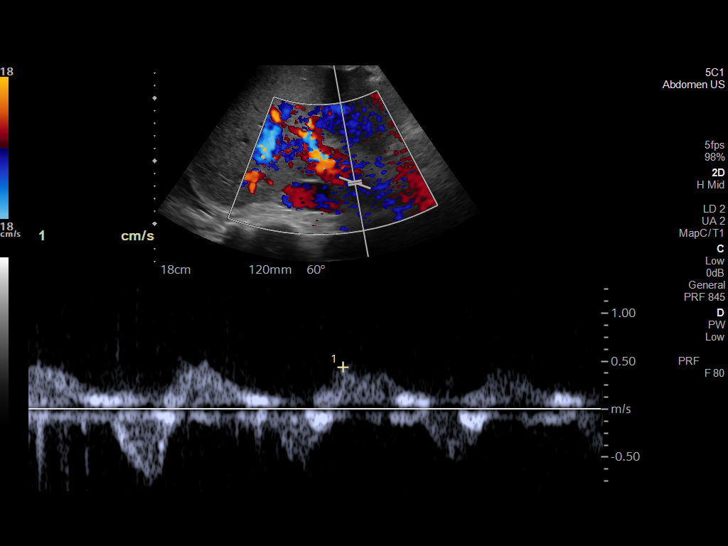
[im 38/101]
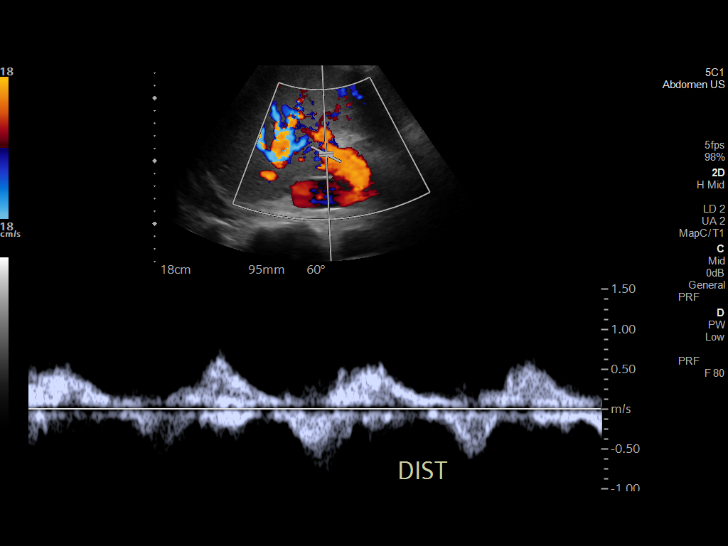
[im 46/101]
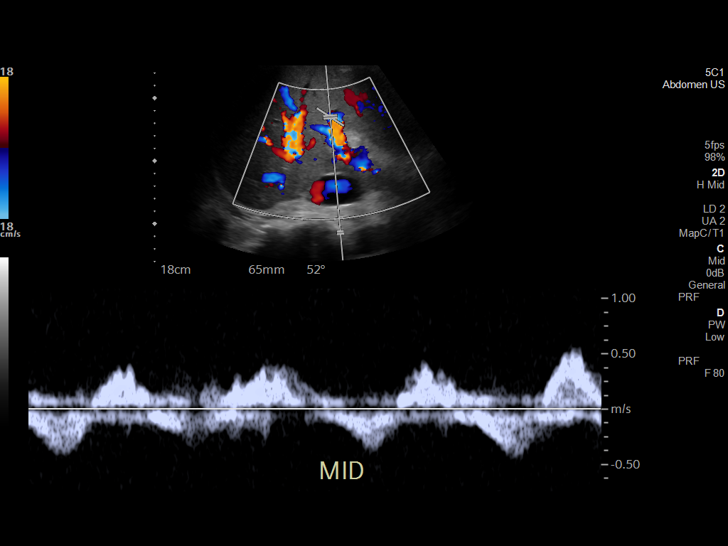
[im 55/101]
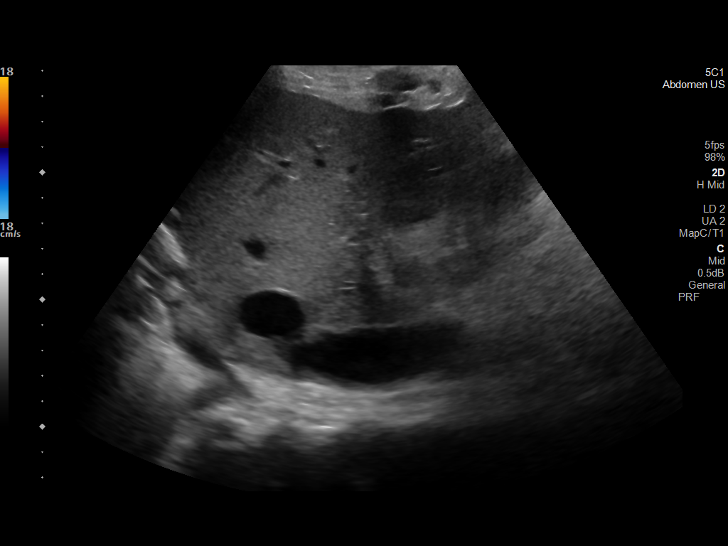
[im 63/101]
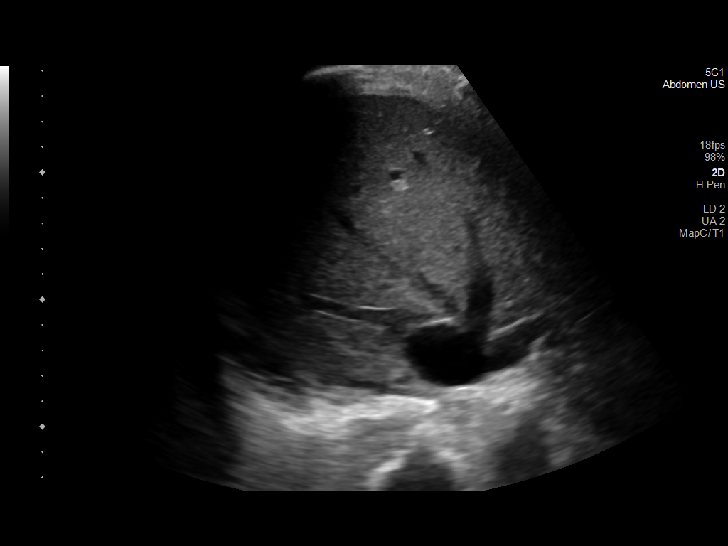
[im 67/101]
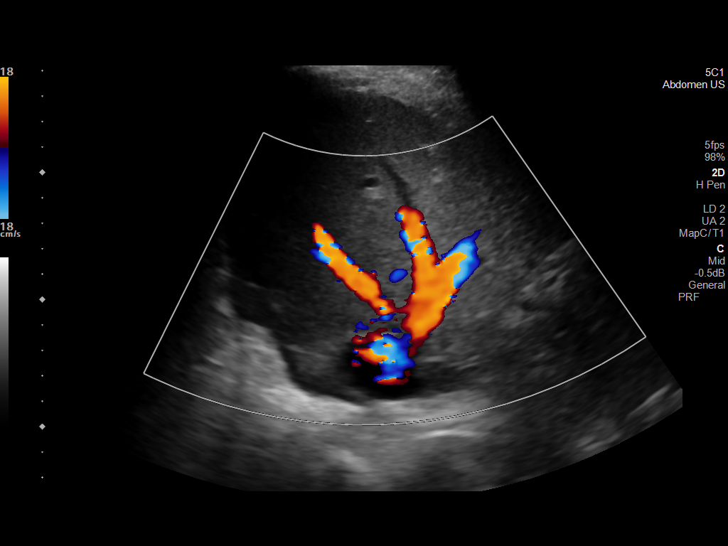
[im 76/101]
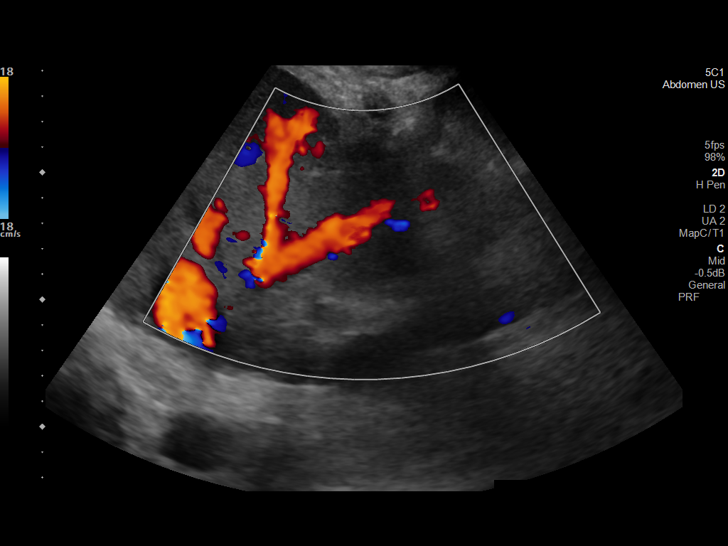
[im 84/101]
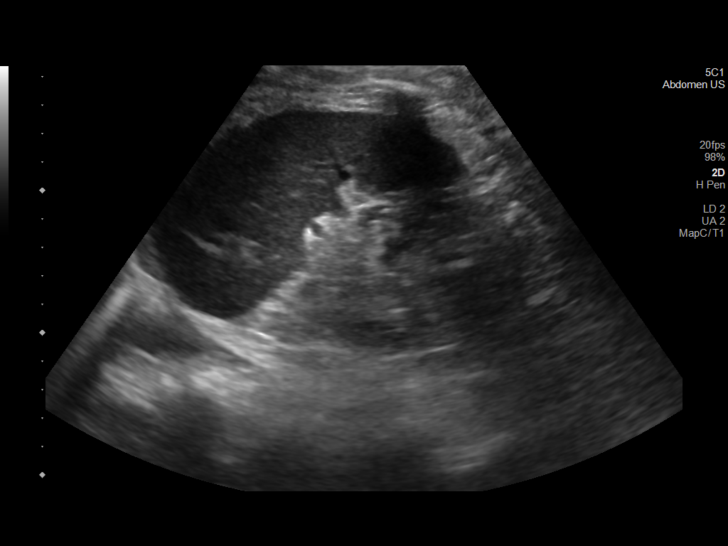
[im 92/101]
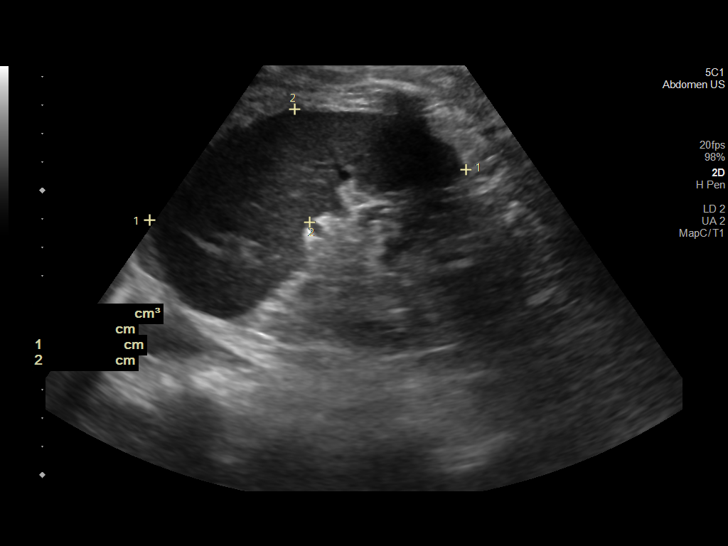
[im 101/101]
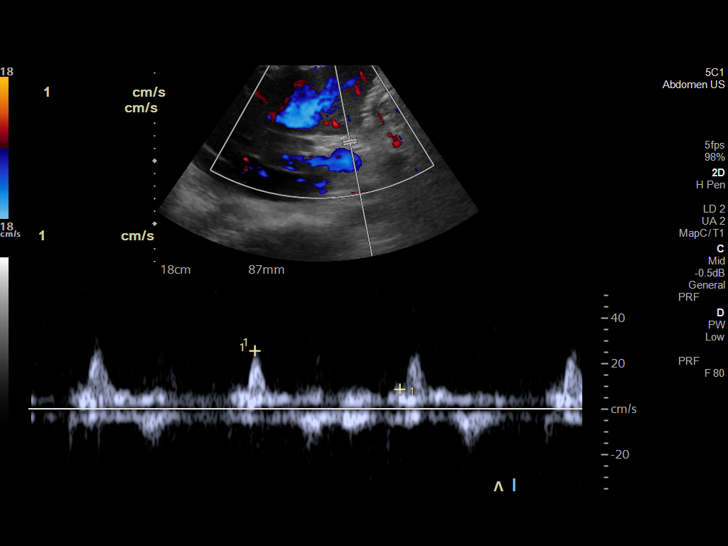

[14 of 25 positions shown; findings below may reference images not displayed]

FINDINGS: Liver: Normal parenchymal echogenicity. Normal hepatic contour
without nodularity.

No focal lesion, mass or intrahepatic biliary ductal dilatation.

Main Portal Vein size: 0.9 cm

Portal Vein Velocities

Main Prox:  44 cm/sec

Main Mid: 51 cm/sec

Main Dist:  71 cm/sec
Right: 44 cm/sec
Left: Nonvisualized

Hepatic Vein Velocities

Right:  14 cm/sec

Middle:  43 cm/sec

Left:  23 cm/sec

IVC: Present and patent with normal respiratory phasicity.

Hepatic Artery Velocity:  26 cm/sec

Splenic Vein Velocity:  17 cm/sec

Spleen: 11.3 cm x 4.0 cm x 4.5 cm with a total volume of 105 cm^3
(411 cm^3 is upper limit normal)

Portal Vein Occlusion/Thrombus: No

Splenic Vein Occlusion/Thrombus: No

Ascites: Trace perihepatic ascites.

Varices: None
IMPRESSION: Portal vein is patent. Bidirectional flow noted within the portal
vein is suspicious for portal hypertension.

## 2021-02-16 MED ORDER — HYDROCORTISONE NA SUCCINATE PF 100 MG IJ SOLR
50.0000 mg | Freq: Two times a day (BID) | INTRAMUSCULAR | Status: AC
  Administered 2021-02-16: 50 mg via INTRAVENOUS
  Filled 2021-02-16: qty 2

## 2021-02-16 MED ORDER — VITAMIN K1 10 MG/ML IJ SOLN
10.0000 mg | Freq: Once | INTRAVENOUS | Status: AC
  Administered 2021-02-16: 10 mg via INTRAVENOUS
  Filled 2021-02-16: qty 1

## 2021-02-16 MED ORDER — PRISMASOL BGK 4/2.5 32-4-2.5 MEQ/L EC SOLN
Status: DC
  Filled 2021-02-16 (×24): qty 5000

## 2021-02-16 MED ORDER — DARBEPOETIN ALFA 150 MCG/0.3ML IJ SOSY
150.0000 ug | PREFILLED_SYRINGE | INTRAMUSCULAR | Status: DC
Start: 2021-02-16 — End: 2021-02-20
  Administered 2021-02-16: 150 ug via SUBCUTANEOUS
  Filled 2021-02-16: qty 0.3

## 2021-02-16 MED ORDER — MIDODRINE HCL 5 MG PO TABS
10.0000 mg | ORAL_TABLET | Freq: Three times a day (TID) | ORAL | Status: DC
  Administered 2021-02-16 – 2021-02-19 (×10): 10 mg
  Filled 2021-02-16 (×10): qty 2

## 2021-02-16 MED ORDER — QUETIAPINE FUMARATE 100 MG PO TABS
100.0000 mg | ORAL_TABLET | Freq: Every day | ORAL | Status: DC
  Administered 2021-02-16: 100 mg via ORAL
  Filled 2021-02-16: qty 1

## 2021-02-16 MED ORDER — DEXMEDETOMIDINE HCL IN NACL 400 MCG/100ML IV SOLN
0.4000 ug/kg/h | INTRAVENOUS | Status: AC
  Administered 2021-02-16 (×2): 0.9 ug/kg/h via INTRAVENOUS
  Administered 2021-02-17: 1 ug/kg/h via INTRAVENOUS
  Administered 2021-02-17: 0.9 ug/kg/h via INTRAVENOUS
  Administered 2021-02-17: 1 ug/kg/h via INTRAVENOUS
  Administered 2021-02-17: 0.9 ug/kg/h via INTRAVENOUS
  Administered 2021-02-17: 1.1 ug/kg/h via INTRAVENOUS
  Administered 2021-02-18: 0.7 ug/kg/h via INTRAVENOUS
  Administered 2021-02-18: 0.6 ug/kg/h via INTRAVENOUS
  Administered 2021-02-19: 0.2 ug/kg/h via INTRAVENOUS
  Administered 2021-02-19: 0.5 ug/kg/h via INTRAVENOUS
  Filled 2021-02-16 (×4): qty 100
  Filled 2021-02-16: qty 200
  Filled 2021-02-16 (×2): qty 100

## 2021-02-16 MED ORDER — PIVOT 1.5 CAL PO LIQD
1000.0000 mL | ORAL | Status: DC
  Administered 2021-02-16 – 2021-02-19 (×4): 1000 mL
  Filled 2021-02-16 (×8): qty 1000

## 2021-02-16 MED ORDER — PRISMASOL BGK 4/2.5 32-4-2.5 MEQ/L REPLACEMENT SOLN
Status: DC
  Filled 2021-02-16 (×8): qty 5000

## 2021-02-16 MED ORDER — DARBEPOETIN ALFA 150 MCG/0.3ML IJ SOSY: 150.0000 ug | PREFILLED_SYRINGE | INTRAMUSCULAR | Status: DC

## 2021-02-16 MED ORDER — DEXAMETHASONE SODIUM PHOSPHATE 10 MG/ML IJ SOLN
6.0000 mg | INTRAMUSCULAR | Status: DC
  Administered 2021-02-17 – 2021-02-19 (×3): 6 mg via INTRAVENOUS
  Filled 2021-02-16 (×3): qty 1

## 2021-02-16 MED ORDER — ORAL CARE MOUTH RINSE
15.0000 mL | Freq: Two times a day (BID) | OROMUCOSAL | Status: DC
  Administered 2021-02-16 (×2): 15 mL via OROMUCOSAL

## 2021-02-16 NOTE — Progress Notes (Signed)
Assisted tele visit to patient with daughter, Bethanne Ginger.  Maryelizabeth Rowan, RN

## 2021-02-16 NOTE — Progress Notes (Signed)
NAME:  Patrick Anthony, MRN:  [id number], DOB:  Aug 02, 1971, LOS: 3 ADMISSION DATE:  02/14/2021, CONSULTATION DATE:  02/06/2021 REFERRING MD:  Dina Rich- TRH, CHIEF COMPLAINT:  respiratory failure on vent   History of Present Illness:  Patrick Anthony is a 50 y/o gentleman with a history of endocarditis and diskitis with recent bacteremia, ESRD who presented from [place] with hypoglycemia after he was found unresponsive this morning. He was diaphoretic but did not regain consciousness when given supplemental dextrose. He developed progressive hypoxia and required intubation. He has a history of MRSA aortic endocarditis, diskitis, and bacteremia in February & June 2022, bioprosthetic MVR, HFpEF, chronic PH, ESRD on HD, BKA. He was discharged on vancomycin; last dose unknown. History obtained from chart review due to patient's encephalopathy. Received ceftriaxone and azithromycin in the ED and was started on vasopressors for shock.  Pertinent  Medical History  MRSA aortic endocarditis, diskitis, and bacteremia in June 2022 bioprosthetic MVR, HFpEF, chronic PH, PFI ESRD on HD BKA Cirrhosis OSA  Significant Hospital Events: Including procedures, antibiotic start and stop dates in addition to other pertinent events   7/10 admitted, intubated, on pressors 7/11 self extubated, off sedation  Interim History / Subjective:  Patient laying in bed, repeatedly stating he is at the dentist. Moans to pain, not following commands.   Objective   Blood pressure 121/90, pulse 73, temperature (!) 96.4 F (35.8 C), temperature source Axillary, resp. rate (!) 37, height '5\' 11"'$  (1.803 m), weight 81.3 kg, SpO2 100 %.        Intake/Output Summary (Last 24 hours) at 02/16/2021 0837 Last data filed at 02/16/2021 0600 Gross per 24 hour  Intake 2673.8 ml  Output 3387 ml  Net -713.2 ml   Filed Weights   02/14/21 0500 02/15/21 0237 02/16/21 0423  Weight: 83 kg 83.1 kg 81.3 kg    Examination: General: critically  ill appearing man, awake appears uncomfortable HENT: [place]/AT, eyes anicteric Lungs: decreased R lateral breath sounds, CTA on left, no wheezing, or rhonchi Cardiovascular: normal rate, s1, s2 present Abdomen: soft, mildly distended, active BS Extremities: fistula LUE positive bruit, BKA RLE warm pulses intact Neuro: Awake but disoriented, not following command or responding appropriately to questions Skin: no rashes or ecchymoses  Resolved Hospital Problem list     Assessment & Plan:  Acute respiratory failure with hypoxia  CAP with R parapneumonic effusion COVID pneumonia -continue ceftriaxone, and azithromycin  - -Hydrocortisone '50mg'$  BID, then switch to dexamethasone tomorrow to completes 10 days of steroids -pulmonary hygiene  -O2 to maintain saturation >90%  Septic shock due to pneumonia WBC of 19.2 this morning. On 8L high flow -vasopressin, norepinephrine to maintain MAP>65 -Start midodrine 10 mg TID per tube once cortrak placed -Continue antibiotics until 7/15 -Hydrocortisone '50mg'$  BID, then switch to dexamethasone tomorrow -monitor CBC, fever curve  Acute metabolic encephalopathy Metabolic acidosis likely lactic acidosis secondary to sepsis Prior history of septic emboli in brain. Mental status remains unchanged -Precedex  -minimize sedation   Hypoglycemia due to sepsis Currently on D10 at 100 ml/hr with with blood glucoses running low. Plan to place cortrak and start tube feeds today -CBG monitoring q4 -SSI sensitive -Place cortrak and start tube feeds  -Stop D10 after starting feeds   ESRD on HD -nephrology consult; has femoral access -Continue CRRT  Acute liver injury  Liver enzyme continue to rise, INR 4.1 this morning.  - continue to monitor  - RUQ doppler  Chronic anemia due to renal failure Acute  thrombocytopenia likely due to sepsis  Elevated INR, given vitamin K 10 mg this morning. No further bloody bowel movements or acute signs of  bleeding. -continue holing heparin -transfuse for Hb<7 or hemodynamically significant bleeding -transfuse for platelets <10 or <50 with bleeding  MRSA endocarditis with stenosis and fracture/perforation the mitral prosthesis Volume overload Not a candidate for replacement. Likely contributing to volume overload and acute hypoxic respiratory failure.  - Continue vancomycin, last day 7/15 per ID notes -palliative care consult  Best Practice (right click and "Reselect all SmartList Selections" daily)  Diet/type: tubefeeds and NPO, plan for cortrak placement today  DVT prophylaxis: not indicated GI prophylaxis: PPI Lines: Central line and Arterial Line Foley:  N/A Code Status:  DNR Last date of multidisciplinary goals of care discussion [7/12- daughter [name] ]  Labs   CBC: Recent Labs  Lab 02/14/2021 [phone] 02/12/2021 0948 02/05/2021 1936 02/14/21 0354 02/14/21 0435 02/14/21 0743 02/14/21 1046 02/15/21 0225 02/15/21 0951 02/16/21 0412  WBC 4.6  --   --  14.1*  --   --   --  22.1*  --  19.2*  NEUTROABS 3.2  --   --   --   --   --   --   --   --   --   HGB 10.3*   < >  --  10.5*   < > 11.6* 12.2* 9.9* 13.3 9.7*  HCT 36.8*   < >  --  35.9*   < > 34.0* 36.0* 33.7* 39.0 32.6*  MCV 95.8  --   --  91.6  --   --   --  91.1  --  90.1  PLT 54*  --  47* 65*  --   --   --  78*  --  69*   < > = values in this interval not displayed.    Basic Metabolic Panel: Recent Labs  Lab 02/14/21 0354 02/14/21 0435 02/14/21 1643 02/15/21 0225 02/15/21 0951 02/15/21 1522 02/16/21 0412  NA 132*   < > 132* 132* 133* 132* 129*  K 4.5   < > 4.3 4.8 4.6 4.9 3.7  CL 90*  --  97* 99  --  100 98  CO2 16*  --  21* 13*  --  11* 14*  GLUCOSE 205*  --  87 58*  --  69* 155*  BUN 35*  --  25* 18  --  15 12  CREATININE 5.19*  --  3.19* 2.68*  --  2.09* 1.68*  CALCIUM 7.6*  --  7.2* 7.4*  --  7.6* 7.5*  MG 1.9  --   --  2.5*  --   --  2.6*  PHOS 5.4*  --  4.4 4.6  --  4.1 3.3   < > = values in this interval  not displayed.   GFR: Estimated Creatinine Clearance: 56.6 mL/min (A) (by C-G formula based on SCr of 1.68 mg/dL (H)). Recent Labs  Lab 03/02/2021 0938 02/14/2021 1554 02/28/2021 1936 02/14/21 0354 02/15/21 0225 02/15/21 0440 02/15/21 2058 02/16/21 0412  WBC 4.6  --   --  14.1* 22.1*  --   --  19.2*  LATICACIDVEN  --    < > >11.0* >11.0*  --  >11.0* >11.0*  --    < > = values in this interval not displayed.    Liver Function Tests: Recent Labs  Lab 02/18/2021 [id number] 02/12/2021 1554 02/14/21 0354 02/14/21 1643 02/15/21 0225 02/15/21 1522 02/16/21 0412  AST 88*  --   --   --  2,004*  --  2,134*  ALT 25  --   --   --  618*  --  724*  ALKPHOS 86  --   --   --  104  --  106  BILITOT 3.5*  --   --   --  4.5*  --  5.1*  PROT 7.9  --   --   --  7.0  --  7.1  ALBUMIN 2.2*   < > 1.9* 1.9* 2.0*  2.0* 2.1* 2.1*  2.1*   < > = values in this interval not displayed.   No results for input(s): LIPASE, AMYLASE in the last 168 hours. No results for input(s): AMMONIA in the last 168 hours.  ABG    Component Value Date/Time   PHART 7.286 (L) 02/15/2021 0951   PCO2ART 33.3 02/15/2021 0951   PO2ART 211 (H) 02/15/2021 0951   HCO3 15.2 (L) 02/15/2021 0951   TCO2 16 (L) 02/15/2021 0951   ACIDBASEDEF 9.0 (H) 02/15/2021 0951   O2SAT 100.0 02/15/2021 0951      Coagulation Profile: Recent Labs  Lab 02/17/2021 1936 02/15/21 0225 02/16/21 0412  INR 2.4* 3.1* 4.1*    Cardiac Enzymes: No results for input(s): CKTOTAL, CKMB, CKMBINDEX, TROPONINI in the last 168 hours.  HbA1C: Hgb A1c MFr Bld  Date/Time Value Ref Range Status  09/29/2020 04:35 AM 5.5 4.8 - 5.6 % Final    Comment:    (NOTE) Pre diabetes:          5.7%-6.4%  Diabetes:              >6.4%  Glycemic control for   <7.0% adults with diabetes   09/28/2020 08:27 AM 5.4 4.8 - 5.6 % Final    Comment:    (NOTE) Pre diabetes:          5.7%-6.4%  Diabetes:              >6.4%  Glycemic control for   <7.0% adults with  diabetes     CBG: Recent Labs  Lab 02/15/21 2004 02/15/21 2034 02/15/21 2341 02/16/21 0416 02/16/21 0758  GLUCAP 67* 110* 135* 93 153*    Review of Systems:   Unable to be obtained due to encephalopathy.  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   Surgical History:   Past Surgical History:  Procedure Laterality Date   AMPUTATION Right 10/29/2020   Procedure: RIGHT ABOVE KNEE AMPUTATION;  Surgeon: [name], MD;  Location: [place];  Service: Orthopedics;  Laterality: Right;   AV FISTULA PLACEMENT Right    AV FISTULA PLACEMENT Left 09/03/2013   Procedure: ARTERIOVENOUS (AV) FISTULA CREATION - LEFT BRACHIOCEPHALIC;  Surgeon: Conrad Oxford, MD;  Location: [place];  Service: Vascular;  Laterality: Left;   AV FISTULA PLACEMENT Left 10/30/2019   Procedure: INSERTION OF ARTERIOVENOUS (AV) GORE-TEX GRAFT ARM;  Surgeon: Rosetta Posner, MD;  Location: [place];  Service: Vascular;  Laterality: Left;   BUBBLE STUDY  10/05/2020   Procedure: BUBBLE STUDY;  Surgeon: Pixie Casino, MD;  Location: [place];  Service: Cardiovascular;;   I & D EXTREMITY Right 10/20/2020  Procedure: IRRIGATION AND DEBRIDEMENT OF LEG;  Surgeon: [name], MD;  Location: [place];  Service: Orthopedics;  Laterality: Right;   INSERTION OF DIALYSIS CATHETER N/A 08/29/2013   Procedure: INSERTION OF DIALYSIS CATHETER;  Surgeon: Mal Misty, MD;  Location: [place];  Service: Vascular;  Laterality: N/A;   INSERTION OF DIALYSIS CATHETER Left 10/30/2019    Procedure: INSERTION OF DIALYSIS CATHETER LEFT INTERNAL JUGULAR;  Surgeon: Rosetta Posner, MD;  Location: [place];  Service: Vascular;  Laterality: Left;   LEFT HEART CATH AND CORONARY ANGIOGRAPHY N/A 10/07/2020   Procedure: LEFT HEART CATH AND CORONARY ANGIOGRAPHY;  Surgeon: Lorretta Harp, MD;  Location: [place] [facility];  Service: Cardiovascular;  Laterality: N/A;   MITRAL VALVE REPAIR Right 10/14/2020   Procedure: MINIMALLY INVASIVE MITRAL VALVE  REPLACEMENT(MVR) USING MEDTRONIC MOSAIC VALVE SIZE 31MM;  Surgeon: Rexene Alberts, MD;  Location: [place];  Service: Open Heart Surgery;  Laterality: Right;   MULTIPLE EXTRACTIONS WITH ALVEOLOPLASTY N/A 10/12/2020   Procedure: MULTIPLE EXTRACTION WITH ALVEOLOPLASTY;  Surgeon: [name], DMD;  Location: [place];  Service: Dentistry;  Laterality: N/A;   PERIPHERAL VASCULAR CATHETERIZATION N/A 01/06/2015   Procedure: A/V Shuntogram/Fistulagram;  Surgeon: Katha Cabal, MD;  Location: [place] [facility];  Service: Cardiovascular;  Laterality: N/A;   PERIPHERAL VASCULAR CATHETERIZATION Left 01/06/2015   Procedure: A/V Shunt Intervention;  Surgeon: Katha Cabal, MD;  Location: [place] [facility];  Service: Cardiovascular;  Laterality: Left;   REPAIR OF PATENT FORAMEN OVALE N/A 10/14/2020   Procedure: CLOSURE OF PATENT FORAMEN OVALE;  Surgeon: Rexene Alberts, MD;  Location: [place];  Service: Open Heart Surgery;  Laterality: N/A;   TEE WITHOUT CARDIOVERSION N/A 10/05/2020   Procedure: TRANSESOPHAGEAL ECHOCARDIOGRAM (TEE);  Surgeon: Pixie Casino, MD;  Location: [place];  Service: Cardiovascular;  Laterality: N/A;   TEE WITHOUT CARDIOVERSION N/A 10/14/2020   Procedure: TRANSESOPHAGEAL ECHOCARDIOGRAM (TEE);  Surgeon: Rexene Alberts, MD;  Location: [place];  Service: Open Heart Surgery;  Laterality: N/A;   TEE WITHOUT CARDIOVERSION N/A 12/24/2020   Procedure: TRANSESOPHAGEAL ECHOCARDIOGRAM (TEE);  Surgeon: Pixie Casino, MD;  [facility]  ENDOS[facility];  Service: Cardiovascular;  Laterality: N/A;     Social History:   reports that he quit smoking about 3 years ago. His smoking use included cigarettes. He smoked an average of 0.20 packs per day. He has never used smokeless tobacco. He reports that he does not drink alcohol and does not use drugs.   Family History:  His family history includes Cancer in his mother; Hypertension in his mother.   Allergies No Known Allergies   Home Medications  Prior to Admission medications   Medication Sig Start Date End Date Taking? Authorizing Provider  acetaminophen (TYLENOL) 325 MG tablet Take 325-650 mg by mouth every 6 (six) hours as needed for mild pain or fever. Temp > 100.5    [provider]  aspirin EC 325 MG EC tablet Take 1 tablet (325 mg total) by mouth daily. 11/10/20   Samella Parr, NP  atorvastatin (LIPITOR) 40 MG tablet Take 1 tablet (40 mg total) by mouth daily. 01/27/21   Dessa Phi, DO  bisacodyl (DULCOLAX) 10 MG suppository Place 1 suppository (10 mg total) rectally daily as needed for moderate constipation. 11/09/20   Samella Parr, NP  calcium acetate (PHOSLO) 667 MG capsule Take 667 mg by mouth 3 (three) times daily with meals.    [provider]  cinacalcet (SENSIPAR) 30 MG tablet Take 2 tablets (60 mg total) by mouth daily with breakfast. 11/10/20   Samella Parr, NP  Darbepoetin Alfa (ARANESP, ALBUMIN FREE, IJ) Darbepoetin Alfa (Aranesp) 07/05/20 07/04/21  [provider]  diltiazem (CARDIZEM CD) 120 MG 24 hr capsule Take 1 capsule (120 mg total) by mouth daily. 01/07/21   Ghimire, Henreitta Leber, MD  docusate sodium (COLACE) 100 MG capsule Take 1 capsule (100 mg total) by mouth 2 (two) times daily. 11/09/20   Samella Parr, NP  methocarbamol (ROBAXIN) 500 MG tablet Take 1 tablet (500 mg total) by mouth 3 (three) times daily. 11/09/20   Samella Parr, NP  metoprolol tartrate (LOPRESSOR) 50 MG tablet Take 0.5 tablets (25 mg total) by mouth 2  (two) times daily. 01/06/21   Ghimire, Henreitta Leber, MD  Multiple Vitamin (MULTIVITAMIN WITH MINERALS) TABS tablet Take 1 tablet by mouth daily.    [provider]  nitroGLYCERIN (NITROSTAT) 0.4 MG SL tablet Place 0.4 mg under the tongue every 5 (five) minutes x 3 doses as needed for chest pain.     [provider]  Nutritional Supplements (,FEEDING SUPPLEMENT, PROSOURCE PLUS) liquid Take 30 mLs by mouth 2 (two) times daily between meals. 11/09/20   Samella Parr, NP  Nutritional Supplements (FEEDING SUPPLEMENT, NEPRO CARB STEADY,) LIQD Take 237 mLs by mouth 3 (three) times daily between meals. 11/09/20   Samella Parr, NP  pantoprazole (PROTONIX) 40 MG tablet Take 1 tablet (40 mg total) by mouth daily. 11/10/20   Samella Parr, NP  polyethylene glycol (MIRALAX / GLYCOLAX) 17 g packet Take 17 g by mouth daily as needed for mild constipation. 11/09/20   Samella Parr, NP  pregabalin (LYRICA) 75 MG capsule Take 1 capsule (75 mg total) by mouth at bedtime. 11/09/20   Samella Parr, NP  senna-docusate (SENOKOT-S) 8.6-50 MG tablet Take 1 tablet by mouth 2 (two) times daily. 11/09/20   Samella Parr, NP  sevelamer carbonate (RENVELA) 800 MG tablet Take 1,600 mg by mouth 3 (three) times daily with meals.    [provider]  Vancomycin (VANCOCIN) 750-5 MG/150ML-% SOLN Inject 150 mLs (750 mg total) into the vein every Monday, Wednesday, and Friday with hemodialysis. 6 weeks of vancomycin from 5/27 12/31/20   Jonetta Osgood, MD     Iona Beard, MD 02/16/21 8:37 AM Internal Medicine, PGY-2 Pager [phone]

## 2021-02-16 NOTE — Progress Notes (Signed)
Patient ID: [name], male   DOB: 09/30/1970, 50 y.o.   MRN: [id number]     Progress Note from the Palliative Medicine Team at [facility]   Patient Name: Patrick Anthony        Date: 02/16/2021 DOB: [birth year]  Age: 50 y.o. MRN#: [id number] Attending Physician: Lanier Clam, MD Primary Care Physician: [name], MD Admit Date: 02/26/2021   Medical records reviewed   50 y.o. male   admitted on 02/09/2021 with a past medical history of endocarditis and diskitis with recent bacteremia, ESRD who presented from [place] with hypoglycemia after he was found unresponsive this morning. He was diaphoretic but did not regain consciousness when given supplemental dextrose. He developed progressive hypoxia and required intubation. He has a history of MRSA aortic endocarditis, diskitis, and bacteremia in February & June 2022, bioprosthetic MVR, HFpEF, chronic PH, ESRD on HD, BKA. He was discharged on vancomycin; last dose unknown. History obtained from chart review due to patient's encephalopathy. Received ceftriaxone and azithromycin in the ED and was started on vasopressors for shock.  Acute Hypoxemic Respiratory Failure: multifactorial related to volume overload, CAP, COVID. Self extubated 7/11. pO2 189 on 8L [place] AM 7/11.      COVID 19:        CAP:       Volume overload: Related to MV dehiscence, not candidate for replacement. Terminal condition.   --CRRT   --Palliative care consult       Septic Shock: Due to CAP.       Toxic Metabolic encephalopathy: In setting of sepsis, prior septic emboli to brain.      ESRD:   --CRRT for now, volume removal     Shock liver: LFTs, INR elevated. Susceptible due to cirrhosis.   This NP visited patient at the bedside as a follow up for palliative medicine  needs and emotional support.  I spoke to daughter [name] by telephone.  Continued conversation regarding current medial; situation.  Family was able to video chat with patient today.    He is really unable to engage in any meaningful communication.   We reviewed his multiple comorbid ites and his poor prognosis.   Daughter understands but is unable to make decisions at this time.  She agrees to Cor-trac placement   Discussed with daughter the importance of continued conversation with family and the  medical providers regarding overall plan of care and treatment options,  ensuring decisions are within the context of the patients values and GOCs.  Plan is to have family conference call tomorrow at 12:00  Questions and concerns addressed   Discussed with bedside RN and attending  Total time spent on the unit was 35 minutes  Greater than 50% of the time was spent in counseling and coordination of care  Wadie Lessen NP  Palliative Medicine Team Team Phone # [phone] Pager [phone]

## 2021-02-16 NOTE — Progress Notes (Signed)
[facility] Progress Note  Subjective: I/O yest -500 cc.  Seen in ICU bed, pt groggy, falls back asleep after awakened.    Vitals:   02/16/21 1030 02/16/21 1045 02/16/21 1100 02/16/21 1115  BP:      Pulse: 73 70    Resp: '13 15 15 12  '$ Temp:      TempSrc:      SpO2: 100% 100%    Weight:      Height:        Exam:   Ill appearing AAM, somnolent   No jvd   Chest cta bilat ant/ lat   Cor reg no RG    Abd soft ntnd, +BS    Ext R AKA, LLE 1+ edema    Neuro somnolent    AVG LUA+b    OP HD: [facility]   4h  350/500  66.5kg  2/2 bath  P1  AVG LUA  - mircera 150 ug q2 wks   Assessment/ Plan: ESRD - usual HD MWF. Getting CRRT here started 7/10. Continue.  Septic shock - secondary to pneumonia; on IV abx, remains on pressorsx2 Volume - weights are inaccurate, edw 66kg, min edema on exam. UF 50- 100 cc/hr as tolerated.  COVID-19 positive - per CCM AHRF - 2/2 pneumonia and parapneumonic effusion, on vent Anemia ckd: Hb 9's. Currently receiving mircera 125mg. Last dose 6/27 > have ordered weekly darbe IV 150ug here.  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, resume binders when taking PO  DNR - prognosis is guarded     Rob Dagmawi Venable 02/16/2021, 11:52 AM   Recent Labs  Lab 02/15/21 0951 02/15/21 1522 02/16/21 0412  K 4.6 4.9 3.7  BUN  --  15 12  CREATININE  --  2.09* 1.68*  CALCIUM  --  7.6* 7.5*  PHOS  --  4.1 3.3  HGB 13.3  --  9.7*    Inpatient medications:  B-complex with vitamin C  1 tablet Per Tube Daily   Chlorhexidine Gluconate Cloth  6 each Topical Daily   [START ON 02/17/2021] dexamethasone (DECADRON) injection  6 mg Intravenous Q24H   hydrocortisone sod succinate (SOLU-CORTEF) inj  50 mg Intravenous Q12H   insulin aspart  0-6 Units Subcutaneous Q4H   mouth rinse  15 mL Mouth Rinse BID   midodrine  10 mg Per Tube TID WC   pantoprazole (PROTONIX) IV  40 mg Intravenous QHS   polyethylene glycol  17 g Per Tube Daily   sodium chloride flush  10-40 mL  Intracatheter Q12H    azithromycin 250 mL/hr at 02/16/21 1100   cefTRIAXone (ROCEPHIN)  IV 2 g (02/16/21 1140)   dextrose 100 mL/hr at 02/16/21 1100   norepinephrine (LEVOPHED) Adult infusion 8 mcg/min (02/16/21 1100)   phenylephrine (NEO-SYNEPHRINE) Adult infusion Stopped (02/05/2021 1414)   prismasol BGK 2/2.5 dialysis solution 2,000 mL/hr at 02/16/21 1043   prismasol BGK 2/2.5 replacement solution 500 mL/hr at 02/16/21 0537   prismasol BGK 2/2.5 replacement solution 300 mL/hr at 02/15/21 1924   vancomycin Stopped (02/15/21 2204)   vasopressin 0.03 Units/min (02/16/21 1100)   docusate sodium, heparin, polyethylene glycol, sodium chloride flush

## 2021-02-16 NOTE — Progress Notes (Signed)
[facility] Progress Note Patient Name: Patrick Anthony DOB: 25-Apr-1971 MRN: [id number]   Date of Service  02/16/2021  HPI/Events of Note  INR 4.1. No overt bleeding.  eICU Interventions  Vitamin K 10 mg iv x 1 ordered.        Katsumi Wisler U [name] 02/16/2021, 7:01 AM

## 2021-02-16 NOTE — Progress Notes (Addendum)
SLP Cancellation Note  Patient Details Name: Patrick Anthony MRN: [id number] DOB: April 03, 1971   Cancelled treatment:       Reason Eval/Treat Not Completed: Other (comment) (In talking with RN, Solmon Ice, she advised pt is not following directions and is lethargic today.  Per RN, plan is for Cortrak today.  Note palliative referral. SLP to follow up next date for po readiness.  Thanks.)  Kathleen Lime, MS [facility] SLP Acute Rehab Services Office [phone] Pager [phone]   Macario Golds 02/16/2021, 9:02 AM

## 2021-02-16 NOTE — Procedures (Signed)
Cortrak  Person Inserting Tube:  Maylon Peppers C, RD Tube Type:  Cortrak - 43 inches Tube Size:  10 Tube Location:  Left nare Initial Placement:  Stomach Secured by: Bridle Technique Used to Measure Tube Placement:  Marking at nare/corner of mouth Cortrak Secured At:  70 cm  Cortrak Tube Team Note:  Consult received to place a Cortrak feeding tube.   X-ray is required, abdominal x-ray has been ordered by the Cortrak team. Please confirm tube placement before using the Cortrak tube.   If the tube becomes dislodged please keep the tube and contact the Cortrak team at [email or web address] (password [email or web address]) for replacement.  If after hours and replacement cannot be delayed, place a NG tube and confirm placement with an abdominal x-ray.    Lockie Pares., RD, LDN, CNSC See AMiON for contact information

## 2021-02-16 NOTE — Progress Notes (Signed)
Nutrition Follow-up  DOCUMENTATION CODES:   Non-severe (moderate) malnutrition in context of chronic illness  INTERVENTION:   Initiate tube feeds via Cortrak tube: - Start Pivot 1.5 @ 25 ml/hr and advance by 10 ml q 6 hours to goal rate of 65 ml/hr (1560 ml/day)  Tube feeding regimen at goal provides 2340 kcal, 146 grams of protein, and 1184 ml of H2O.   - Continue B-complex with vitamin C daily to account for losses with CRRT  NUTRITION DIAGNOSIS:   Moderate Malnutrition related to chronic illness (CHF, ESRD) as evidenced by moderate fat depletion, severe muscle depletion.  New diagnosis after completion of NFPE  GOAL:   Patient will meet greater than or equal to 90% of their needs  Will be met via TF at goal  MONITOR:   Diet advancement, Labs, Weight trends, TF tolerance, I & O's  REASON FOR ASSESSMENT:   Ventilator, Consult Enteral/tube feeding initiation and management  ASSESSMENT:   50 year old male who presented to the ED on 7/10 from SNF after being found unresponsive. Pt required intubation in the ED. PMH of HTN, CHF, endocarditis and discitis with recent bacteremia, ESRD on HD, BKA. Pt admitted with acute respiratory failure with hypoxia due to pneumonia as well as septic shock. Pt also found to be COVID-19 positive.  7/10 - CRRT initiated 7/11 - self-extubated 7/13 - Cortrak placed (tip gastric)  Discussed pt with RN and during ICU rounds. Pt is lethargic and not following directions today. Family agreeable to Cortrak placement today. Will plan to start tube feeds at trickle rate and slowly advance to goal. RN aware. Pt with increased kcal and protein needs related to COVID+ status and CRRT.  EDW: 66.5 kg Admit weight: 65 kg Current weight: 81.3 kg  Pt continues to have edema to BUE and LLE.  After completion of NFPE, pt meets criteria for moderate malnutrition. Suspect malnutrition is actually severe and that pt has lost a significant amount of weight  recently prior to being admitted. Unable to confirm severe malnutrition at this time as pt's weight is falsely elevated due to fluid and fluid likely masking depletions.  Medications reviewed and include: B-complex with vitamin C, IV solu-cortef, SSI q 4 hours, IV protonix, miralax, IV abx  Drips: precedex, D10 @ 100 ml/hr, levophed, vasopressin  Labs reviewed: sodium 129, creatinine 1.68, magnesium 2.6, elevated LFTs, lactic acid >11.0, hemoglobin 9.7 CBG's: 34-153 x 24 hours  CRRT UF: 3698 x 24 hours I/O's: +3.7 L since admit  NUTRITION - FOCUSED PHYSICAL EXAM:  Flowsheet Row Most Recent Value  Orbital Region Moderate depletion  Upper Arm Region Unable to assess  Thoracic and Lumbar Region Moderate depletion  Buccal Region Severe depletion  Temple Region Severe depletion  Clavicle Bone Region Mild depletion  Clavicle and Acromion Bone Region Moderate depletion  Scapular Bone Region Unable to assess  Dorsal Hand Unable to assess  Patellar Region Severe depletion  Anterior Thigh Region Severe depletion  [R BKA]  Posterior Calf Region Severe depletion  Edema (RD Assessment) Severe  Hair Reviewed  [none]  Eyes Unable to assess  Mouth Unable to assess  Skin Reviewed  Nails Reviewed  [dark]       Diet Order:   Diet Order             Diet NPO time specified  Diet effective now                   EDUCATION NEEDS:   Not appropriate for  education at this time  Skin:  Skin Assessment: Reviewed RN Assessment  Last BM:  02/16/21 small type 6  Height:   Ht Readings from Last 1 Encounters:  02/14/21 5' 11"  (1.803 m)    Weight:   Wt Readings from Last 1 Encounters:  02/16/21 81.3 kg    Ideal Body Weight:  73.1 kg (adjusted for R BKA)  BMI:  Body mass index is 25 kg/m.  Adjusted BMI using EDW: 21.8 kg/m  Estimated Nutritional Needs:   Kcal:  2300-2500  Protein:  130-150 grams  Fluid:  1000 ml + UOP    Gustavus Bryant, MS, RD, LDN Inpatient Clinical  Dietitian Please see AMiON for contact information.

## 2021-02-16 NOTE — Plan of Care (Signed)
  Problem: Safety: Goal: Non-violent Restraint(s) Outcome: Not Progressing   Problem: Education: Goal: Knowledge of General Education information will improve Description: Including pain rating scale, medication(s)/side effects and non-pharmacologic comfort measures Outcome: Not Progressing   Problem: Health Behavior/Discharge Planning: Goal: Ability to manage health-related needs will improve Outcome: Not Progressing

## 2021-02-17 DIAGNOSIS — N186 End stage renal disease: Secondary | ICD-10-CM | POA: Diagnosis not present

## 2021-02-17 DIAGNOSIS — Z515 Encounter for palliative care: Secondary | ICD-10-CM | POA: Diagnosis not present

## 2021-02-17 DIAGNOSIS — G9340 Encephalopathy, unspecified: Secondary | ICD-10-CM | POA: Diagnosis not present

## 2021-02-17 DIAGNOSIS — K7200 Acute and subacute hepatic failure without coma: Secondary | ICD-10-CM | POA: Diagnosis not present

## 2021-02-17 DIAGNOSIS — J9601 Acute respiratory failure with hypoxia: Secondary | ICD-10-CM | POA: Diagnosis not present

## 2021-02-17 DIAGNOSIS — Z992 Dependence on renal dialysis: Secondary | ICD-10-CM | POA: Diagnosis not present

## 2021-02-17 LAB — MRSA NEXT GEN BY PCR, NASAL: MRSA by PCR Next Gen: NOT DETECTED

## 2021-02-17 LAB — RENAL FUNCTION PANEL
Albumin: 2 g/dL — ABNORMAL LOW (ref 3.5–5.0)
Albumin: 2 g/dL — ABNORMAL LOW (ref 3.5–5.0)
Anion gap: 17 — ABNORMAL HIGH (ref 5–15)
Anion gap: 14 (ref 5–15)
BUN: 10 mg/dL (ref 6–20)
BUN: 14 mg/dL (ref 6–20)
CO2: 13 mmol/L — ABNORMAL LOW (ref 22–32)
CO2: 16 mmol/L — ABNORMAL LOW (ref 22–32)
Calcium: 7.3 mg/dL — ABNORMAL LOW (ref 8.9–10.3)
Calcium: 7.3 mg/dL — ABNORMAL LOW (ref 8.9–10.3)
Chloride: 100 mmol/L (ref 98–111)
Chloride: 101 mmol/L (ref 98–111)
Creatinine, Ser: 1.4 mg/dL — ABNORMAL HIGH (ref 0.61–1.24)
Creatinine, Ser: 1.37 mg/dL — ABNORMAL HIGH (ref 0.61–1.24)
GFR, Estimated: >60 mL/min (ref >60)
GFR, Estimated: >60 mL/min (ref >60)
Glucose, Bld: 96 mg/dL (ref 70–99)
Glucose, Bld: 149 mg/dL — ABNORMAL HIGH (ref 70–99)
Phosphorus: 2.5 mg/dL (ref 2.5–4.6)
Phosphorus: 2 mg/dL — ABNORMAL LOW (ref 2.5–4.6)
Potassium: 3.7 mmol/L (ref 3.5–5.1)
Potassium: 4 mmol/L (ref 3.5–5.1)
Sodium: 130 mmol/L — ABNORMAL LOW (ref 135–145)
Sodium: 131 mmol/L — ABNORMAL LOW (ref 135–145)

## 2021-02-17 LAB — CBC
HCT: 30.7 % — ABNORMAL LOW (ref 39.0–52.0)
Hemoglobin: 9 g/dL — ABNORMAL LOW (ref 13.0–17.0)
MCH: 26.5 pg (ref 26.0–34.0)
MCHC: 29.3 g/dL — ABNORMAL LOW (ref 30.0–36.0)
MCV: 90.3 fL (ref 80.0–100.0)
Platelets: 34 10*3/uL — ABNORMAL LOW (ref 150–400)
RBC: 3.4 MIL/uL — ABNORMAL LOW (ref 4.22–5.81)
RDW: 23.5 % — ABNORMAL HIGH (ref 11.5–15.5)
WBC: 16.7 10*3/uL — ABNORMAL HIGH (ref 4.0–10.5)
nRBC: 8.3 % — ABNORMAL HIGH (ref 0.0–0.2)

## 2021-02-17 LAB — PROTIME-INR
INR: 4.8 (ref 0.8–1.2)
Prothrombin Time: 44.9 seconds — ABNORMAL HIGH (ref 11.4–15.2)

## 2021-02-17 LAB — PATHOLOGIST SMEAR REVIEW

## 2021-02-17 LAB — GLUCOSE, CAPILLARY
Glucose-Capillary: 98 mg/dL (ref 70–99)
Glucose-Capillary: 109 mg/dL — ABNORMAL HIGH (ref 70–99)
Glucose-Capillary: 118 mg/dL — ABNORMAL HIGH (ref 70–99)
Glucose-Capillary: 149 mg/dL — ABNORMAL HIGH (ref 70–99)
Glucose-Capillary: 145 mg/dL — ABNORMAL HIGH (ref 70–99)
Glucose-Capillary: 213 mg/dL — ABNORMAL HIGH (ref 70–99)

## 2021-02-17 LAB — MAGNESIUM: Magnesium: 2.4 mg/dL (ref 1.7–2.4)

## 2021-02-17 MED ORDER — CHLORHEXIDINE GLUCONATE 0.12 % MT SOLN
15.0000 mL | Freq: Two times a day (BID) | OROMUCOSAL | Status: DC
  Administered 2021-02-17 – 2021-02-19 (×5): 15 mL via OROMUCOSAL
  Filled 2021-02-17 (×5): qty 15

## 2021-02-17 MED ORDER — POLYETHYLENE GLYCOL 3350 17 G PO PACK: 17.0000 g | PACK | Freq: Every day | ORAL | Status: DC | PRN

## 2021-02-17 MED ORDER — PANTOPRAZOLE SODIUM 40 MG PO PACK
40.0000 mg | PACK | Freq: Every day | ORAL | Status: DC
  Administered 2021-02-17 – 2021-02-19 (×3): 40 mg
  Filled 2021-02-17 (×3): qty 20

## 2021-02-17 MED ORDER — DOCUSATE SODIUM 50 MG/5ML PO LIQD: 100.0000 mg | Freq: Two times a day (BID) | ORAL | Status: DC | PRN

## 2021-02-17 MED ORDER — ORAL CARE MOUTH RINSE
15.0000 mL | Freq: Two times a day (BID) | OROMUCOSAL | Status: DC
  Administered 2021-02-17 – 2021-02-19 (×5): 15 mL via OROMUCOSAL

## 2021-02-17 MED ORDER — LORAZEPAM 2 MG/ML IJ SOLN
0.5000 mg | INTRAMUSCULAR | Status: DC | PRN
  Administered 2021-02-17: 0.5 mg via INTRAVENOUS
  Filled 2021-02-17: qty 1

## 2021-02-17 MED ORDER — ORAL CARE MOUTH RINSE: 15.0000 mL | Freq: Two times a day (BID) | OROMUCOSAL | Status: DC

## 2021-02-17 MED ORDER — QUETIAPINE FUMARATE 100 MG PO TABS
100.0000 mg | ORAL_TABLET | Freq: Every day | ORAL | Status: DC
  Administered 2021-02-17 – 2021-02-18 (×2): 100 mg
  Filled 2021-02-17 (×2): qty 1

## 2021-02-17 NOTE — Plan of Care (Signed)
  Problem: Safety: Goal: Non-violent Restraint(s) Outcome: Not Progressing   Problem: Education: Goal: Knowledge of General Education information will improve Description: Including pain rating scale, medication(s)/side effects and non-pharmacologic comfort measures Outcome: Not Progressing   Problem: Health Behavior/Discharge Planning: Goal: Ability to manage health-related needs will improve Outcome: Not Progressing   Problem: Clinical Measurements: Goal: Ability to maintain clinical measurements within normal limits will improve Outcome: Not Progressing

## 2021-02-17 NOTE — Progress Notes (Signed)
PHARMACIST - PHYSICIAN COMMUNICATION  DR: Hunsucker  CONCERNING: IV to Oral Route Change Policy  RECOMMENDATION: This patient is receivingProtonix by the intravenous route.  Based on criteria approved by the Pharmacy and Therapeutics Committee, the intravenous medication(s) is/are being converted to the equivalent oral dose form(s).   DESCRIPTION: These criteria include: The patient is eating (either orally or via tube) and/or has been taking other orally administered medications for a least 24 hours The patient has no evidence of active gastrointestinal bleeding or impaired GI absorption (gastrectomy, short bowel, patient on TNA or NPO).  If you have questions about this conversion, please contact the Pharmacy Department  '[]'$   [phone] )  [facility] '[]'$   [phone] )  [facility] '[x]'$   [phone] )  [facility] '[]'$   [phone] )  [facility] '[]'$   [phone] )  [facility]   Laurey Arrow, PharmD PGY1 Pharmacy Resident 02/17/2021  10:05 AM  Please check [email or web address] for unit-specific pharmacy phone numbers.

## 2021-02-17 NOTE — Progress Notes (Signed)
SLP Cancellation Note  Patient Details Name: Sonam Yazzie MRN: [id number] DOB: 1970-11-15   Cancelled treatment:       Reason Eval/Treat Not Completed: Other (comment) (PMT has been consulted. Case discussed with April, RN who indicted that a meeting is planned for today and suggested that the BSE be deferred at this time considering the pt's current mentation. It was agreed that SLP will be contacted later today if pt's status improves.)  Sharla Tankard I. Hardin Negus, [place], [place] Office number [phone] Pager [place] 02/17/2021, 9:38 AM

## 2021-02-17 NOTE — Progress Notes (Signed)
Pharmacy Antibiotic Note  Patrick Anthony is a 50 y.o. male admitted on 02/15/2021 presenting from facility with AMS and concern for PNA. The patient has a history of MRSA endocarditis/discitis with planned Vancomycin course completion on 7/8. Pharmacy consulted to resume Vancomycin dosing along with Rocephin + Azithromycin per MD.    The patient was transitioned to CRRT on 7/10 PM.   Plan: - Continue Vancomycin 1g IV every 24 hours - stop date in for 7/15 - Will continue to follow CRRT duration/tolerance - Stopping Azithromycin    Height: '5\' 11"'$  (180.3 cm) Weight: 76.2 kg (167 lb 15.9 oz) IBW/kg (Calculated) : 75.3  Temp (24hrs), Avg:96.1 F (35.6 C), Min:94 F (34.4 C), Max:97.8 F (36.6 C)  Recent Labs  Lab 02/04/2021 0938 02/11/2021 0948 02/04/2021 1936 02/14/21 0354 02/14/21 0926 02/14/21 1643 02/15/21 0225 02/15/21 0440 02/15/21 1522 02/15/21 2058 02/16/21 0412 02/16/21 0757 02/16/21 1533 02/17/21 0327  WBC 4.6  --   --  14.1*  --   --  22.1*  --   --   --  19.2*  --   --  16.7*  CREATININE 6.55*   < >  --  5.19*  --    < > 2.68*  --  2.09*  --  1.68*  --  1.45* 1.40*  LATICACIDVEN  --    < > >11.0* >11.0*  --   --   --  >11.0*  --  >11.0*  --  >11.0*  --   --   VANCORANDOM  --   --   --   --  26  --   --   --   --   --   --   --   --   --    < > = values in this interval not displayed.     Estimated Creatinine Clearance: 68 mL/min (A) (by C-G formula based on SCr of 1.4 mg/dL (H)).    No Known Allergies  Azithromycin 7/10 >> 7/14 Rocephin 7/10 >> 7/15 Vancomycin 7/10 >> 7/15  7/10 COVID >> positive 7/10 MRSA PCR >> neg 7/10 BCx x2 >> NGx4d  Thank you for allowing pharmacy to be a part of this patient's care.  Laurey Arrow, PharmD PGY1 Pharmacy Resident 02/17/2021  9:54 AM  Please check [email or web address] for unit-specific pharmacy phone numbers.

## 2021-02-17 NOTE — Progress Notes (Signed)
Patient ID: [name], male   DOB: 1971-05-17, 50 y.o.   MRN: [id number]     Progress Note from the Palliative Medicine Team at [facility]   Patient Name: Patrick Anthony        Date: 02/17/2021 DOB: 01/09/1971  Age: 50 y.o. MRN#: [id number] Attending Physician: Lanier Clam, MD Primary Care Physician: [name], MD Admit Date: 02/18/2021   Medical records reviewed   50 y.o. male   admitted on 02/04/2021 with a past medical history of endocarditis and diskitis with recent bacteremia, ESRD who presented from [place] with hypoglycemia after he was found unresponsive this morning. He was diaphoretic but did not regain consciousness when given supplemental dextrose. He developed progressive hypoxia and required intubation. He has a history of MRSA aortic endocarditis, diskitis, and bacteremia in February & June 2022, bioprosthetic MVR, HFpEF, chronic PH, ESRD on HD, BKA. He was discharged on vancomycin; last dose unknown. History obtained from chart review due to patient's encephalopathy. Received ceftriaxone and azithromycin in the ED and was started on vasopressors for shock.  Acute Hypoxemic Respiratory Failure: multifactorial related to volume overload, CAP, COVID. Self extubated 7/11. pO2 189 on 8L [place] AM 7/11.      COVID 19, CAP, Volume overload: Related to MV dehiscence, not candidate for replacement. Terminal condition, septic shock/CAP, encephalopathy,  ESRD   Shock liver/ cirrhosis.   This NP reviewed medical records, spoke to the team and then spoke to large family via conference call updating them on current medical situation.  Patient is encephalopathic and unable to engage in meaningful communication.  Education offered on his multiple comorbid ites and his poor prognosis.   Education and conversation had regarding human mortality,  adult failure to thrive, and the limitations of medical interventions to prolong quality of life when a body fails to  thrive.  Education offered on the long-term poor prognosis and recommendation made to shift to a comfort path allowing for a natural death.   Most family members verbalized an understanding of the seriousness of the current medical situation and will support patient's daughter [name] as she makes the difficult  decisions facing her.   She plans to come to [place] this weekend to see her father in person hoping that will help her make decisions.    Discussed with daughter the importance of continued conversation with her family and the  medical providers regarding overall plan of care and treatment options,  ensuring decisions are within the context of the patients values and GOCs.  Questions and concerns addressed   Discussed with bedside RN and attending via secure chat  Total time spent on the unit was 75 minutes  Greater than 50% of the time was spent in counseling and coordination of care  Wadie Lessen NP  Palliative Medicine Team Team Phone # [phone] Pager [phone]

## 2021-02-17 NOTE — Progress Notes (Signed)
[facility] Progress Note  Subjective: I/O yest - 1 L.    Vitals:   02/17/21 1315 02/17/21 1330 02/17/21 1345 02/17/21 1400  BP:      Pulse: 80  76 79  Resp: (!) 21 17 (!) 21 (!) 21  Temp:      TempSrc:      SpO2: 100%  100% 100%  Weight:      Height:        Exam:   Ill appearing AAM, somnolent   No jvd   Chest cta bilat ant/ lat   Cor reg no RG    Abd soft ntnd, +BS    Ext R AKA, LLE 1+ edema    Neuro somnolent, not following commands    AVG LUA+b    OP HD: [facility]   4h  350/500  66.5kg  2/2 bath  P1  AVG LUA  - mircera 150 ug q2 wks   Assessment/ Plan: ESRD - usual HD MWF. Getting CRRT here started 7/10. Cont for now.   Septic shock - secondary to pneumonia; on IV abx, remains on pressorsx2 Volume - weights are inaccurate, edw 66kg, min edema on exam. UF 50 cc/hr as tolerated.  COVID-19 positive - per CCM AHRF - 2/2 pneumonia and parapneumonic effusion, on vent Anemia ckd: Hb 9's. Currently receiving mircera 147mg. Last dose 6/27 > have ordered weekly darbe IV 150ug here.  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, resume binders when taking PO  DNR - prognosis is guarded     Rob Dorian Duval 02/17/2021, 2:10 PM   Recent Labs  Lab 02/16/21 1533 02/16/21 1545 02/17/21 0327  K 3.4* 3.4* 3.7  BUN 9  --  10  CREATININE 1.45*  --  1.40*  CALCIUM 7.4*  --  7.3*  PHOS 3.3  --  2.5  HGB  --  12.9* 9.0*    Inpatient medications:  B-complex with vitamin C  1 tablet Per Tube Daily   chlorhexidine  15 mL Mouth Rinse BID   Chlorhexidine Gluconate Cloth  6 each Topical Daily   darbepoetin (ARANESP) injection - NON-DIALYSIS  150 mcg Subcutaneous Q Wed-1800   dexamethasone (DECADRON) injection  6 mg Intravenous Q24H   insulin aspart  0-6 Units Subcutaneous Q4H   mouth rinse  15 mL Mouth Rinse q12n4p   midodrine  10 mg Per Tube TID WC   pantoprazole sodium  40 mg Per Tube QHS   polyethylene glycol  17 g Per Tube Daily   QUEtiapine  100 mg Per Tube QHS   sodium  chloride flush  10-40 mL Intracatheter Q12H     prismasol BGK 4/2.5 500 mL/hr at 02/17/21 0623   cefTRIAXone (ROCEPHIN)  IV Stopped (02/17/21 1147)   dexmedetomidine (PRECEDEX) IV infusion 0.8 mcg/kg/hr (02/17/21 1400)   feeding supplement (PIVOT 1.5 CAL) 55 mL/hr at 02/17/21 1200   norepinephrine (LEVOPHED) Adult infusion 4 mcg/min (02/17/21 1400)   prismasol BGK 2/2.5 replacement solution 300 mL/hr at 02/16/21 2239   prismasol BGK 4/2.5 1,500 mL/hr at 02/17/21 1301   vancomycin Stopped (02/16/21 2207)   vasopressin 0.03 Units/min (02/17/21 1400)   docusate, heparin, polyethylene glycol, sodium chloride flush

## 2021-02-17 NOTE — Progress Notes (Signed)
This RN has ongoing issues with becoming agitated, throwing L leg over bedrail and causing occlusion and flow interruptions via CRRT circuit. This RN increased the precedex infusion and re-assured patient of surrounding in hopes of calming patient.

## 2021-02-17 NOTE — Progress Notes (Signed)
NAME:  Patrick Anthony, MRN:  [id number], DOB:  June 30, 1971, LOS: 4 ADMISSION DATE:  02/10/2021, CONSULTATION DATE:  02/19/2021 REFERRING MD:  Dina Rich- TRH, CHIEF COMPLAINT:  respiratory failure on vent   History of Present Illness:  Patrick Anthony is a 50 y/o gentleman with a history of endocarditis and diskitis with recent bacteremia, ESRD who presented from [place] with hypoglycemia after he was found unresponsive this morning. He was diaphoretic but did not regain consciousness when given supplemental dextrose. He developed progressive hypoxia and required intubation. He has a history of MRSA aortic endocarditis, diskitis, and bacteremia in February & June 2022, bioprosthetic MVR, HFpEF, chronic PH, ESRD on HD, BKA. He was discharged on vancomycin; last dose unknown. History obtained from chart review due to patient's encephalopathy. Received ceftriaxone and azithromycin in the ED and was started on vasopressors for shock.  Pertinent  Medical History  MRSA aortic endocarditis, diskitis, and bacteremia in June 2022 bioprosthetic MVR, HFpEF, chronic PH, PFI ESRD on HD BKA Cirrhosis OSA  Significant Hospital Events: Including procedures, antibiotic start and stop dates in addition to other pertinent events   7/10 admitted, intubated, on pressors 7/11 self extubated, off sedation 7/13 Cortrak placed started on tube feeds  Interim History / Subjective:  Patient sleeping this morning, intermittently moaning, but not following commands  Objective   Blood pressure 121/90, pulse 81, temperature (!) 97.1 F (36.2 C), temperature source Axillary, resp. rate 19, height '5\' 11"'$  (1.803 m), weight 76.2 kg, SpO2 100 %.        Intake/Output Summary (Last 24 hours) at 02/17/2021 0723 Last data filed at 02/17/2021 0700 Gross per 24 hour  Intake 3114.3 ml  Output 4227 ml  Net -1112.7 ml    Filed Weights   02/15/21 0237 02/16/21 0423 02/17/21 0216  Weight: 83.1 kg 81.3 kg 76.2 kg    Examination: General: critically ill appearing man, moaning intermittently HENT: [place]/AT, eyes anicteric Lungs: CTA bilaterally, no wheezing or rhonchi, no increased work of breathing Cardiovascular: normal rate, s1, s2 present Abdomen: soft, mildly distended, active BS Extremities: fistula LUE positive bruit, BKA RLE, trace pitting edema of LLE Neuro: Sleeping alerts to voice but not answering questions or following commands Skin: no rashes or ecchymoses  Resolved Hospital Problem list    Assessment & Plan:  Acute respiratory failure with hypoxia  ABG stable. On 8 L high flow [place].  -pulmonary hygiene  -O2 to maintain saturation >90%  Septic shock due to pneumonia WBC of 19.2 this morning. On 8L high flow -vasopressin, norepinephrine to maintain MAP>65, consider decreased MAP goal in setting of cirrhosis if liver function remains stable -midodrine 10 mg TID per tube -Continue vancomycin and ceftriaxone until 7/15, stop azithromycin for Qtc 527 -monitor CBC, fever curve  COVID pneumonia Off remdesivir due to elevated LFTs -dexamethasone 6 mg daily to complete 10 days  Acute metabolic encephalopathy Metabolic acidosis likely lactic acidosis secondary to sepsis Prior history of septic emboli in brain. Likely also component of delirium. Intermittently agitated overnight. -Remains on precedex, may consider clonidine to help wean off  -Seroquel '100mg'$  qhs, unable to increase due to Qtc  -minimize sedation   Hypoglycemia due to sepsis Tube feeds started on 7/13 at 63m/hr, goal rate 65 mL/hr. Off D10, nofurther hypoglycemia -CBG monitoring q4 -SSI sensitive -Continue tube feeds, titrate to goal rate  ESRD on HD -nephrology consult; has femoral access -Continue CRRT  Acute liver injury  AST, ALT, Bili continue to rise slowly.  - continue to  monitor  - RUQ doppler pending  Chronic anemia due to renal failure Acute thrombocytopenia likely due to sepsis  Worsening  thrombocytopenia to 32, hgb trending downwards, no signs of active bleeding -continue holding heparin -transfuse for Hb<7 or hemodynamically significant bleeding -transfuse for platelets <10 or <50 with bleeding  MRSA endocarditis with stenosis and fracture/perforation the mitral prosthesis Volume overload Not a candidate for replacement. Likely contributing to volume overload and acute hypoxic respiratory failure.  -Continue vancomycin, last day 7/15 per ID notes -CRRT -palliative care consult -Family to discuss [place] this afternoon  Best Practice (right click and "Reselect all SmartList Selections" daily)  Diet/type: tubefeeds and NPO DVT prophylaxis: not indicated GI prophylaxis: PPI Lines: Central line and Arterial Line Foley:  N/A Code Status:  DNR Last date of multidisciplinary goals of care discussion [7/13- daughter Patrick Anthony] Plan for family meeting with palliative today  Labs   CBC: Recent Labs  Lab 02/07/2021 0938 02/19/2021 0948 02/17/2021 1936 02/14/21 0354 02/14/21 0435 02/15/21 0225 02/15/21 0951 02/16/21 0412 02/16/21 1545 02/17/21 0327  WBC 4.6  --   --  14.1*  --  22.1*  --  19.2*  --  16.7*  NEUTROABS 3.2  --   --   --   --   --   --   --   --   --   HGB 10.3*   < >  --  10.5*   < > 9.9* 13.3 9.7* 12.9* 9.0*  HCT 36.8*   < >  --  35.9*   < > 33.7* 39.0 32.6* 38.0* 30.7*  MCV 95.8  --   --  91.6  --  91.1  --  90.1  --  90.3  PLT 54*  --  47* 65*  --  78*  --  69*  --  34*   < > = values in this interval not displayed.     Basic Metabolic Panel: Recent Labs  Lab 02/14/21 0354 02/14/21 0435 02/15/21 0225 02/15/21 0951 02/15/21 1522 02/16/21 0412 02/16/21 1533 02/16/21 1545 02/17/21 0327  NA 132*   < > 132*   < > 132* 129* 128* 131* 130*  K 4.5   < > 4.8   < > 4.9 3.7 3.4* 3.4* 3.7  CL 90*   < > 99  --  100 98 99  --  100  CO2 16*   < > 13*  --  11* 14* 11*  --  13*  GLUCOSE 205*   < > 58*  --  69* 155* 147*  --  96  BUN 35*   < > 18  --  '15 12 9   '$ --  10  CREATININE 5.19*   < > 2.68*  --  2.09* 1.68* 1.45*  --  1.40*  CALCIUM 7.6*   < > 7.4*  --  7.6* 7.5* 7.4*  --  7.3*  MG 1.9  --  2.5*  --   --  2.6*  --   --  2.4  PHOS 5.4*   < > 4.6  --  4.1 3.3 3.3  --  2.5   < > = values in this interval not displayed.    GFR: Estimated Creatinine Clearance: 68 mL/min (A) (by C-G formula based on SCr of 1.4 mg/dL (H)). Recent Labs  Lab 02/14/21 0354 02/15/21 0225 02/15/21 0440 02/15/21 2058 02/16/21 0412 02/16/21 0757 02/17/21 0327  WBC 14.1* 22.1*  --   --  19.2*  --  16.7*  LATICACIDVEN >11.0*  --  >11.0* >11.0*  --  >11.0*  --      Liver Function Tests: Recent Labs  Lab 03/06/2021 0938 02/24/2021 1554 02/15/21 0225 02/15/21 1522 02/16/21 0412 02/16/21 1533 02/17/21 0327  AST 88*  --  2,004*  --  2,134*  --  2,318*  ALT 25  --  618*  --  724*  --  818*  ALKPHOS 86  --  104  --  106  --  115  BILITOT 3.5*  --  4.5*  --  5.1*  --  6.3*  PROT 7.9  --  7.0  --  7.1  --  6.9  ALBUMIN 2.2*   < > 2.0*  2.0* 2.1* 2.1*  2.1* 2.1* 1.8*  2.0*   < > = values in this interval not displayed.    No results for input(s): LIPASE, AMYLASE in the last 168 hours. No results for input(s): AMMONIA in the last 168 hours.  ABG    Component Value Date/Time   PHART 7.367 02/16/2021 1545   PCO2ART 20.3 (L) 02/16/2021 1545   PO2ART 172 (H) 02/16/2021 1545   HCO3 12.0 (L) 02/16/2021 1545   TCO2 13 (L) 02/16/2021 1545   ACIDBASEDEF 12.0 (H) 02/16/2021 1545   O2SAT 100.0 02/16/2021 1545      Coagulation Profile: Recent Labs  Lab 02/09/2021 1936 02/15/21 0225 02/16/21 0412  INR 2.4* 3.1* 4.1*     Cardiac Enzymes: No results for input(s): CKTOTAL, CKMB, CKMBINDEX, TROPONINI in the last 168 hours.  HbA1C: Hgb A1c MFr Bld  Date/Time Value Ref Range Status  09/29/2020 04:35 AM 5.5 4.8 - 5.6 % Final    Comment:    (NOTE) Pre diabetes:          5.7%-6.4%  Diabetes:              >6.4%  Glycemic control for   <7.0% adults with  diabetes   09/28/2020 08:27 AM 5.4 4.8 - 5.6 % Final    Comment:    (NOTE) Pre diabetes:          5.7%-6.4%  Diabetes:              >6.4%  Glycemic control for   <7.0% adults with diabetes     CBG: Recent Labs  Lab 02/16/21 1135 02/16/21 1546 02/16/21 2001 02/16/21 2330 02/17/21 0331  GLUCAP 135* 149* 173* 127* 98     Review of Systems:   Unable to be obtained due to encephalopathy.  Past Medical History:  He,  has a past medical history of Bacterial endocarditis (09/27/2020), 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 (09/29/2020), Chronic kidney disease, Cirrhosis of liver ([place]), Drug addiction ([place]), ETOH abuse, Hepatitis, Hypertension, Mitral regurgitation, Patent foramen ovale (10/23/2019), Renal disorder (renal failure), S/P minimally-invasive mitral valve replacement with bioprosthetic valve (10/14/2020), Sleep apnea, and Systolic and diastolic CHF, 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[place]) (04/19/2013).   Surgical History:   Past Surgical History:  Procedure Laterality Date   AMPUTATION Right 10/29/2020   Procedure: RIGHT ABOVE KNEE AMPUTATION;  Surgeon: [name], MD;  Location: [facility];  Service: Orthopedics;  Laterality: Right;   AV FISTULA PLACEMENT Right    AV FISTULA PLACEMENT Left 09/03/2013   Procedure: ARTERIOVENOUS (AV) FISTULA CREATION - LEFT BRACHIOCEPHALIC;  Surgeon: Conrad Elmsford, MD;  Location: [facility];  Service: Vascular;  Laterality: Left;   AV FISTULA PLACEMENT Left 10/30/2019   Procedure: INSERTION OF ARTERIOVENOUS (AV) GORE-TEX GRAFT ARM;  Surgeon:  Rosetta Posner, MD;  Location: [facility];  Service: Vascular;  Laterality: Left;   BUBBLE STUDY  10/05/2020   Procedure: BUBBLE STUDY;  Surgeon: Pixie Casino, MD;  Location: [facility];  Service: Cardiovascular;;   I & D EXTREMITY Right 10/20/2020   Procedure: IRRIGATION AND DEBRIDEMENT OF LEG;  Surgeon: [name], MD;  Location: [facility];  Service: Orthopedics;  Laterality: Right;   INSERTION OF DIALYSIS CATHETER N/A  08/29/2013   Procedure: INSERTION OF DIALYSIS CATHETER;  Surgeon: Mal Misty, MD;  Location: [facility];  Service: Vascular;  Laterality: N/A;   INSERTION OF DIALYSIS CATHETER Left 10/30/2019   Procedure: INSERTION OF DIALYSIS CATHETER LEFT INTERNAL JUGULAR;  Surgeon: Rosetta Posner, MD;  Location: [facility];  Service: Vascular;  Laterality: Left;   LEFT HEART CATH AND CORONARY ANGIOGRAPHY N/A 10/07/2020   Procedure: LEFT HEART CATH AND CORONARY ANGIOGRAPHY;  Surgeon: Lorretta Harp, MD;  Location: [facility];  Service: Cardiovascular;  Laterality: N/A;   MITRAL VALVE REPAIR Right 10/14/2020   Procedure: MINIMALLY INVASIVE MITRAL VALVE  REPLACEMENT(MVR) USING MEDTRONIC MOSAIC VALVE SIZE 31MM;  Surgeon: Rexene Alberts, MD;  Location: [facility];  Service: Open Heart Surgery;  Laterality: Right;   MULTIPLE EXTRACTIONS WITH ALVEOLOPLASTY N/A 10/12/2020   Procedure: MULTIPLE EXTRACTION WITH ALVEOLOPLASTY;  Surgeon: [name], DMD;  Location: [facility];  Service: Dentistry;  Laterality: N/A;   PERIPHERAL VASCULAR CATHETERIZATION N/A 01/06/2015   Procedure: A/V Shuntogram/Fistulagram;  Surgeon: Katha Cabal, MD;  Location: [facility];  Service: Cardiovascular;  Laterality: N/A;   PERIPHERAL VASCULAR CATHETERIZATION Left 01/06/2015   Procedure: A/V Shunt Intervention;  Surgeon: Katha Cabal, MD;  Location: [facility];  Service: Cardiovascular;  Laterality: Left;   REPAIR OF PATENT FORAMEN OVALE N/A 10/14/2020   Procedure: CLOSURE OF PATENT FORAMEN OVALE;  Surgeon: Rexene Alberts, MD;  Location: [facility];  Service: Open Heart Surgery;  Laterality: N/A;   TEE WITHOUT CARDIOVERSION N/A 10/05/2020   Procedure: TRANSESOPHAGEAL ECHOCARDIOGRAM (TEE);  Surgeon: Pixie Casino, MD;  Location: [facility];  Service: Cardiovascular;  Laterality: N/A;   TEE WITHOUT CARDIOVERSION N/A 10/14/2020   Procedure: TRANSESOPHAGEAL ECHOCARDIOGRAM (TEE);  Surgeon: Rexene Alberts, MD;  Location: [facility];   Service: Open Heart Surgery;  Laterality: N/A;   TEE WITHOUT CARDIOVERSION N/A 12/24/2020   Procedure: TRANSESOPHAGEAL ECHOCARDIOGRAM (TEE);  Surgeon: Pixie Casino, MD;  Location: [facility];  Service: Cardiovascular;  Laterality: N/A;     Social History:   reports that he quit smoking about 3 years ago. His smoking use included cigarettes. He smoked an average of .2 packs per day. He has never used smokeless tobacco. He reports that he does not drink alcohol and does not use drugs.   Family History:  His family history includes Cancer in his mother; Hypertension in his mother.   Allergies No Known Allergies   Home Medications  Prior to Admission medications   Medication Sig Start Date End Date Taking? Authorizing Provider  acetaminophen (TYLENOL) 325 MG tablet Take 325-650 mg by mouth every 6 (six) hours as needed for mild pain or fever. Temp > 100.5    [provider]  aspirin EC 325 MG EC tablet Take 1 tablet (325 mg total) by mouth daily. 11/10/20   Samella Parr, NP  atorvastatin (LIPITOR) 40 MG tablet Take 1 tablet (40 mg total) by mouth daily. 01/27/21   Dessa Phi, DO  bisacodyl (  DULCOLAX) 10 MG suppository Place 1 suppository (10 mg total) rectally daily as needed for moderate constipation. 11/09/20   Samella Parr, NP  calcium acetate (PHOSLO) 667 MG capsule Take 667 mg by mouth 3 (three) times daily with meals.    [provider]  cinacalcet (SENSIPAR) 30 MG tablet Take 2 tablets (60 mg total) by mouth daily with breakfast. 11/10/20   Samella Parr, NP  Darbepoetin Alfa (ARANESP, ALBUMIN FREE, IJ) Darbepoetin Alfa (Aranesp) 07/05/20 07/04/21  [provider]  diltiazem (CARDIZEM CD) 120 MG 24 hr capsule Take 1 capsule (120 mg total) by mouth daily. 01/07/21   Ghimire, Henreitta Leber, MD  docusate sodium (COLACE) 100 MG capsule Take 1 capsule (100 mg total) by mouth 2 (two) times daily. 11/09/20   Samella Parr, NP  methocarbamol (ROBAXIN) 500 MG  tablet Take 1 tablet (500 mg total) by mouth 3 (three) times daily. 11/09/20   Samella Parr, NP  metoprolol tartrate (LOPRESSOR) 50 MG tablet Take 0.5 tablets (25 mg total) by mouth 2 (two) times daily. 01/06/21   Ghimire, Henreitta Leber, MD  Multiple Vitamin (MULTIVITAMIN WITH MINERALS) TABS tablet Take 1 tablet by mouth daily.    [provider]  nitroGLYCERIN (NITROSTAT) 0.4 MG SL tablet Place 0.4 mg under the tongue every 5 (five) minutes x 3 doses as needed for chest pain.     [provider]  Nutritional Supplements (,FEEDING SUPPLEMENT, PROSOURCE PLUS) liquid Take 30 mLs by mouth 2 (two) times daily between meals. 11/09/20   Samella Parr, NP  Nutritional Supplements (FEEDING SUPPLEMENT, NEPRO CARB STEADY,) LIQD Take 237 mLs by mouth 3 (three) times daily between meals. 11/09/20   Samella Parr, NP  pantoprazole (PROTONIX) 40 MG tablet Take 1 tablet (40 mg total) by mouth daily. 11/10/20   Samella Parr, NP  polyethylene glycol (MIRALAX / GLYCOLAX) 17 g packet Take 17 g by mouth daily as needed for mild constipation. 11/09/20   Samella Parr, NP  pregabalin (LYRICA) 75 MG capsule Take 1 capsule (75 mg total) by mouth at bedtime. 11/09/20   Samella Parr, NP  senna-docusate (SENOKOT-S) 8.6-50 MG tablet Take 1 tablet by mouth 2 (two) times daily. 11/09/20   Samella Parr, NP  sevelamer carbonate (RENVELA) 800 MG tablet Take 1,600 mg by mouth 3 (three) times daily with meals.    [provider]  Vancomycin (VANCOCIN) 750-5 MG/150ML-% SOLN Inject 150 mLs (750 mg total) into the vein every Monday, Wednesday, and Friday with hemodialysis. 6 weeks of vancomycin from 5/27 12/31/20   Jonetta Osgood, MD     Iona Beard, MD 02/17/21 7:23 AM Internal Medicine, PGY-2 Pager [phone]

## 2021-02-18 ENCOUNTER — Ambulatory Visit: Payer: Medicaid Other | Admitting: Infectious Diseases

## 2021-02-18 DIAGNOSIS — J9601 Acute respiratory failure with hypoxia: Secondary | ICD-10-CM | POA: Diagnosis not present

## 2021-02-18 DIAGNOSIS — R4189 Other symptoms and signs involving cognitive functions and awareness: Secondary | ICD-10-CM | POA: Diagnosis not present

## 2021-02-18 LAB — HEPATIC FUNCTION PANEL
ALT: 818 U/L — ABNORMAL HIGH (ref 0–44)
ALT: 997 U/L — ABNORMAL HIGH (ref 0–44)
AST: 2318 U/L — ABNORMAL HIGH (ref 15–41)
AST: 2726 U/L — ABNORMAL HIGH (ref 15–41)
Albumin: 1.8 g/dL — ABNORMAL LOW (ref 3.5–5.0)
Albumin: 2 g/dL — ABNORMAL LOW (ref 3.5–5.0)
Alkaline Phosphatase: 115 U/L (ref 38–126)
Alkaline Phosphatase: 148 U/L — ABNORMAL HIGH (ref 38–126)
Bilirubin, Direct: 4.2 mg/dL — ABNORMAL HIGH (ref 0.0–0.2)
Bilirubin, Direct: 5.1 mg/dL — ABNORMAL HIGH (ref 0.0–0.2)
Indirect Bilirubin: 2.1 mg/dL — ABNORMAL HIGH (ref 0.3–0.9)
Indirect Bilirubin: 2.8 mg/dL — ABNORMAL HIGH (ref 0.3–0.9)
Total Bilirubin: 6.3 mg/dL — ABNORMAL HIGH (ref 0.3–1.2)
Total Bilirubin: 7.9 mg/dL — ABNORMAL HIGH (ref 0.3–1.2)
Total Protein: 6.9 g/dL (ref 6.5–8.1)
Total Protein: 7.1 g/dL (ref 6.5–8.1)

## 2021-02-18 LAB — RENAL FUNCTION PANEL
Albumin: 2 g/dL — ABNORMAL LOW (ref 3.5–5.0)
Albumin: 2 g/dL — ABNORMAL LOW (ref 3.5–5.0)
Anion gap: 8 (ref 5–15)
Anion gap: 11 (ref 5–15)
BUN: 19 mg/dL (ref 6–20)
BUN: 22 mg/dL — ABNORMAL HIGH (ref 6–20)
CO2: 20 mmol/L — ABNORMAL LOW (ref 22–32)
CO2: 21 mmol/L — ABNORMAL LOW (ref 22–32)
Calcium: 7.3 mg/dL — ABNORMAL LOW (ref 8.9–10.3)
Calcium: 7.2 mg/dL — ABNORMAL LOW (ref 8.9–10.3)
Chloride: 102 mmol/L (ref 98–111)
Chloride: 100 mmol/L (ref 98–111)
Creatinine, Ser: 1.31 mg/dL — ABNORMAL HIGH (ref 0.61–1.24)
Creatinine, Ser: 1.21 mg/dL (ref 0.61–1.24)
GFR, Estimated: >60 mL/min (ref >60)
GFR, Estimated: >60 mL/min (ref >60)
Glucose, Bld: 200 mg/dL — ABNORMAL HIGH (ref 70–99)
Glucose, Bld: 209 mg/dL — ABNORMAL HIGH (ref 70–99)
Phosphorus: 1.4 mg/dL — ABNORMAL LOW (ref 2.5–4.6)
Phosphorus: 2.2 mg/dL — ABNORMAL LOW (ref 2.5–4.6)
Potassium: 3.6 mmol/L (ref 3.5–5.1)
Potassium: 4.1 mmol/L (ref 3.5–5.1)
Sodium: 130 mmol/L — ABNORMAL LOW (ref 135–145)
Sodium: 132 mmol/L — ABNORMAL LOW (ref 135–145)

## 2021-02-18 LAB — GLUCOSE, CAPILLARY
Glucose-Capillary: 201 mg/dL — ABNORMAL HIGH (ref 70–99)
Glucose-Capillary: 204 mg/dL — ABNORMAL HIGH (ref 70–99)
Glucose-Capillary: 64 mg/dL — ABNORMAL LOW (ref 70–99)
Glucose-Capillary: 219 mg/dL — ABNORMAL HIGH (ref 70–99)
Glucose-Capillary: 204 mg/dL — ABNORMAL HIGH (ref 70–99)
Glucose-Capillary: 228 mg/dL — ABNORMAL HIGH (ref 70–99)

## 2021-02-18 LAB — CBC WITH DIFFERENTIAL/PLATELET
Abs Immature Granulocytes: 0.14 10*3/uL — ABNORMAL HIGH (ref 0.00–0.07)
Basophils Absolute: 0 10*3/uL (ref 0.0–0.1)
Basophils Relative: 0 %
Eosinophils Absolute: 0 10*3/uL (ref 0.0–0.5)
Eosinophils Relative: 0 %
HCT: 29.9 % — ABNORMAL LOW (ref 39.0–52.0)
Hemoglobin: 9.3 g/dL — ABNORMAL LOW (ref 13.0–17.0)
Immature Granulocytes: 1 %
Lymphocytes Relative: 8 %
Lymphs Abs: 1.2 10*3/uL (ref 0.7–4.0)
MCH: 27.1 pg (ref 26.0–34.0)
MCHC: 31.1 g/dL (ref 30.0–36.0)
MCV: 87.2 fL (ref 80.0–100.0)
Monocytes Absolute: 1.2 10*3/uL — ABNORMAL HIGH (ref 0.1–1.0)
Monocytes Relative: 8 %
Neutro Abs: 12 10*3/uL — ABNORMAL HIGH (ref 1.7–7.7)
Neutrophils Relative %: 83 %
Platelets: 35 10*3/uL — ABNORMAL LOW (ref 150–400)
RBC: 3.43 MIL/uL — ABNORMAL LOW (ref 4.22–5.81)
RDW: 23.9 % — ABNORMAL HIGH (ref 11.5–15.5)
WBC: 14.6 10*3/uL — ABNORMAL HIGH (ref 4.0–10.5)
nRBC: 15.7 % — ABNORMAL HIGH (ref 0.0–0.2)

## 2021-02-18 LAB — CULTURE, BLOOD (ROUTINE X 2)
Culture: NO GROWTH
Culture: NO GROWTH
Special Requests: ADEQUATE
Special Requests: ADEQUATE

## 2021-02-18 LAB — PROTIME-INR
INR: 3.9 — ABNORMAL HIGH (ref 0.8–1.2)
Prothrombin Time: 38.2 seconds — ABNORMAL HIGH (ref 11.4–15.2)

## 2021-02-18 LAB — AMMONIA: Ammonia: 126 umol/L — ABNORMAL HIGH (ref 9–35)

## 2021-02-18 LAB — MAGNESIUM: Magnesium: 2.7 mg/dL — ABNORMAL HIGH (ref 1.7–2.4)

## 2021-02-18 MED ORDER — LACTULOSE 10 GM/15ML PO SOLN
20.0000 g | Freq: Two times a day (BID) | ORAL | Status: DC
  Administered 2021-02-18 – 2021-02-19 (×4): 20 g
  Filled 2021-02-18 (×4): qty 30

## 2021-02-18 MED ORDER — INSULIN ASPART 100 UNIT/ML IJ SOLN
2.0000 [IU] | INTRAMUSCULAR | Status: DC
  Administered 2021-02-18 – 2021-02-19 (×10): 2 [IU] via SUBCUTANEOUS

## 2021-02-18 MED ORDER — POTASSIUM PHOSPHATES 15 MMOLE/5ML IV SOLN
30.0000 mmol | Freq: Once | INTRAVENOUS | Status: AC
  Administered 2021-02-18: 30 mmol via INTRAVENOUS
  Filled 2021-02-18: qty 10

## 2021-02-18 NOTE — Progress Notes (Signed)
[facility] Progress Note  Subjective: Patient not seen directly today given COVID-19 + status, utilizing data taken from chart +/- discussions w/ providers and staff.     Vitals:   02/18/21 1315 02/18/21 1330 02/18/21 1345 02/18/21 1400  BP:      Pulse:   74   Resp: '16 15 16 18  '$ Temp:      TempSrc:      SpO2:   100%   Weight:      Height:        Exam:   Patient not seen directly today given COVID-19 + status, utilizing data taken from chart +/- discussions w/ providers and staff.      OP HD: [facility]   4h  350/500  66.5kg  2/2 bath  P1  AVG LUA  - mircera 150 ug q2 wks   Assessment/ Plan: ESRD - usual HD MWF. Getting CRRT here started 7/10. Cont CRRT.   Septic shock / PNA - on IV abx, remains on pressors x 2 Volume - weights aren't accurate. Min edema on exam. UF 50 cc/hr as tolerated.  COVID-19 positive - per CCM AHRF - 2/2 pneumonia and parapneumonic effusion, self-extubated. Getting HFNC.  Anemia ckd: Hb 9's. Currently receiving mircera 151mg. Last dose 6/27 > have ordered weekly darbe IV 150ug here.  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, resume binders when taking PO  DNR - prognosis is guarded     Patrick Anthony 02/18/2021, 2:37 PM   Recent Labs  Lab 02/17/21 0327 02/17/21 1600 02/18/21 0321  K 3.7 4.0 3.6  BUN '10 14 19  '$ CREATININE 1.40* 1.37* 1.31*  CALCIUM 7.3* 7.3* 7.3*  PHOS 2.5 2.0* 1.4*  HGB 9.0*  --  9.3*    Inpatient medications:  B-complex with vitamin C  1 tablet Per Tube Daily   chlorhexidine  15 mL Mouth Rinse BID   Chlorhexidine Gluconate Cloth  6 each Topical Daily   darbepoetin (ARANESP) injection - NON-DIALYSIS  150 mcg Subcutaneous Q Wed-1800   dexamethasone (DECADRON) injection  6 mg Intravenous Q24H   insulin aspart  0-6 Units Subcutaneous Q4H   insulin aspart  2 Units Subcutaneous Q4H   lactulose  20 g Per Tube BID   mouth rinse  15 mL Mouth Rinse q12n4p   midodrine  10 mg Per Tube TID WC   pantoprazole sodium  40 mg Per  Tube QHS   polyethylene glycol  17 g Per Tube Daily   QUEtiapine  100 mg Per Tube QHS   sodium chloride flush  10-40 mL Intracatheter Q12H     prismasol BGK 4/2.5 500 mL/hr at 02/18/21 1208   cefTRIAXone (ROCEPHIN)  IV Stopped (02/17/21 1147)   dexmedetomidine (PRECEDEX) IV infusion 0.6 mcg/kg/hr (02/18/21 1400)   feeding supplement (PIVOT 1.5 CAL) 65 mL/hr at 02/18/21 1300   norepinephrine (LEVOPHED) Adult infusion 4 mcg/min (02/18/21 1400)   prismasol BGK 2/2.5 replacement solution 300 mL/hr at 02/18/21 0618   prismasol BGK 4/2.5 1,500 mL/hr at 02/18/21 1154   vancomycin Stopped (02/17/21 2206)   vasopressin 0.03 Units/min (02/18/21 1400)   docusate, heparin, polyethylene glycol, sodium chloride flush

## 2021-02-18 NOTE — Progress Notes (Signed)
Inpatient Diabetes Program Recommendations  AACE/ADA: New Consensus Statement on Inpatient Glycemic Control   Target Ranges:  Prepandial:   less than 140 mg/dL      Peak postprandial:   less than 180 mg/dL (1-2 hours)      Critically ill patients:  140 - 180 mg/dL    Results for MARKAS, BAUMEL (MRN [id number]) as of 02/18/2021 10:00  Ref. Range 02/17/2021 08:15 02/17/2021 11:24 02/17/2021 15:18 02/17/2021 19:25 02/17/2021 23:08 02/18/2021 03:18 02/18/2021 07:33 02/18/2021 07:36  Glucose-Capillary Latest Ref Range: 70 - 99 mg/dL 109 (H) 118 (H) 149 (H) 145 (H) 213 (H) 201 (H) 64 (L) 204 (H)   Review of Glycemic Control  Current orders for Inpatient glycemic control: Novolog 0-6 units Q4H; Decadron 6 mg Q24H, Pivot @ 65 ml/hr  Inpatient Diabetes Program Recommendations:    Insulin:  Please consider ordering Novolog 2 units Q4H for tube feeding coverage. If tube feeding is stopped or held then Novolog tube feeding coverage should also be stopped or held.  Thanks, Barnie Alderman, RN, MSN, CDE Diabetes Coordinator Inpatient Diabetes Program [phone] (Team Pager from 8am to 5pm)

## 2021-02-18 NOTE — TOC Initial Note (Signed)
Transition of Care [facility]) - Initial/Assessment Note    Patient Details  Name: Patrick Anthony MRN: [id number] Date of Birth: [birth year]  Transition of Care [facility]) CM/SW Contact:    Verdell Carmine, RN Phone Number: 02/18/2021, 12:03 PM  Clinical Narrative:                 Admitted to [place] ICU.from [place].  Patient was intubated  then self etubated. On CRRT, for renal replacement therapy. [place] meeting will happen between palliative and family, patient is a DNR, minimally responsive. CM/CSW  will follow for needs.   Expected Discharge Plan: Skilled Nursing Facility Barriers to Discharge: Continued Medical Work up   Patient Goals and CMS Choice        Expected Discharge Plan and Services Expected Discharge Plan: [place] Acute Care Choice: [place] Living arrangements for the past 2 months: [place]                                      Prior Living Arrangements/Services Living arrangements for the past 2 months: [place] Lives with:: Facility Resident Patient language and need for interpreter reviewed:: Yes        Need for Family Participation in Patient Care: Yes (Comment) Care giver support system in place?: Yes (comment)   Criminal Activity/Legal Involvement Pertinent to Current Situation/Hospitalization: No - Comment as needed  Activities of Daily Living   ADL Screening (condition at time of admission) Patient's cognitive ability adequate to safely complete daily activities?: No Is the patient deaf or have difficulty hearing?: No Does the patient have difficulty seeing, even when wearing glasses/contacts?: No Does the patient have difficulty concentrating, remembering, or making decisions?: No Patient able to express need for assistance with ADLs?: Yes Does the patient have difficulty dressing or bathing?: No Independently performs ADLs?: Yes (appropriate for developmental  age)  Permission Sought/Granted                  Emotional Assessment       Orientation: : Fluctuating Orientation (Suspected and/or reported [place]) Alcohol / Substance Use: Not Applicable Psych Involvement: No (comment)  Admission diagnosis:  Hypoglycemia [E16.2] Unresponsive [R41.89] Acute respiratory failure with hypoxia (HCC) [J96.01] Patient Active Problem List   Diagnosis Date Noted   Unresponsive    Encounter for central line care    Acute liver failure without hepatic coma    Acute respiratory failure with hypoxia ([place]) 03/01/2021   Prosthetic valve endocarditis ([place]) 01/23/2021   Chronic pain disorder 01/23/2021   Acute bacterial endocarditis 01/21/2021   SIRS (systemic inflammatory response syndrome) ([place]) 12/22/2020   Moderate protein-calorie malnutrition ([place]) 11/11/2020   Other acute osteomyelitis, right tibia and fibula ([place]) 11/11/2020   S/P AKA (above knee amputation), right (HCC)    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)    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)    Abscess of right leg    S/P mitral valve replacement    S/P mitral valve repair    S/P minimally-invasive mitral valve replacement with bioprosthetic valve 10/14/2020   Dental caries    Chronic apical periodontitis    Chronic periodontitis    Accretions on teeth    Mitral regurgitation    Uncontrolled hypertension  Patent foramen ovale    Cerebral embolism with cerebral infarction 09/29/2020   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 09/29/2020   Bacteremia due to Staphylococcus aureus    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) 03/07/2017   Iron deficiency anemia, unspecified 11/15/2015   Coagulation defect, unspecified ([place]) 10/08/2015   Gastrointestinal hemorrhage, unspecified 10/08/2015   Pain, unspecified 10/08/2015   Secondary hyperparathyroidism of renal origin ([place]) 10/08/2015   End stage renal disease ([place]) 10/17/2013   Anemia in chronic kidney disease 08/30/2013   Hepatitis C 08/30/2013   ESRD on dialysis ([place]) 08/30/2013   Hypertensive crisis 08/28/2013   Hypertensive emergency 04/20/2013   Chronic combined systolic and diastolic congestive heart failure ([place]) 04/19/2013   HTN (hypertension) 04/19/2013   PCP:  [name], MD Pharmacy:  No Pharmacies Listed    Social Determinants of Health (SDOH) Interventions    Readmission Risk Interventions Readmission Risk Prevention Plan 11/05/2020  Transportation Screening Complete  Medication Review ([place]) Complete  PCP or Specialist appointment within 3-5 days of discharge Complete  HRI or Home Care Consult Complete  SW Recovery Care/Counseling Consult Complete  Palliative Care Screening Complete  Skilled Nursing Facility Complete  Some recent data might be hidden

## 2021-02-18 NOTE — Progress Notes (Signed)
SLP Cancellation Note  Patient Details Name: Patrick Anthony MRN: [id number] DOB: 1971/06/18   Cancelled treatment:       Reason Eval/Treat Not Completed: Fatigue/lethargy limiting ability to participate (Pt's nurse reported that the pt remains inadequately alert to participate in swallow evaluation and that family will be arriving tomorrow to make a decision regarding [name]. SLP will sign off at this time, but please reconsult if clinically indicated.)  Laniesha Das I. Hardin Negus, [place], [place] Office number [phone] Pager [place] 02/18/2021, 11:25 AM

## 2021-02-18 NOTE — Progress Notes (Signed)
[facility] Progress Note Patient Name: Patrick Anthony DOB: 09/20/1970 MRN: [id number]   Date of Service  02/18/2021  HPI/Events of Note  Patient needs  Flexiseal fecal management system for copious diarrhea .  eICU Interventions  Flexiseal ordered.        Kerry Kass [name] 02/18/2021, 9:04 PM

## 2021-02-18 NOTE — Progress Notes (Addendum)
NAME:  Patrick Anthony, MRN:  [id number], DOB:  [birth year], LOS: 5 ADMISSION DATE:  03/04/2021, CONSULTATION DATE:  02/23/2021 REFERRING MD:  Patrick Anthony- TRH, CHIEF COMPLAINT:  respiratory failure on vent   History of Present Illness:  [name] is a 50 y/o gentleman with a history of endocarditis and diskitis with recent bacteremia, ESRD who presented from [place] with hypoglycemia after he was found unresponsive this morning. He was diaphoretic but did not regain consciousness when given supplemental dextrose. He developed progressive hypoxia and required intubation. He has a history of MRSA aortic endocarditis, diskitis, and bacteremia in February & June 2022, bioprosthetic MVR, HFpEF, chronic PH, ESRD on HD, BKA. He was discharged on vancomycin; last dose unknown. History obtained from chart review due to patient's encephalopathy. Received ceftriaxone and azithromycin in the ED and was started on vasopressors for shock.  Pertinent  Medical History  MRSA aortic endocarditis, diskitis, and bacteremia in June 2022 bioprosthetic MVR, HFpEF, chronic PH, PFI ESRD on HD BKA Cirrhosis OSA  Significant Hospital Events: Including procedures, antibiotic start and stop dates in addition to other pertinent events   7/10 admitted, intubated, on pressors 7/11 self extubated, off sedation 7/13 Cortrak placed started on tube feeds  Interim History / Subjective:  Patient sleeping this morning, withdraws from pain but otherwise not arousable.   Objective   Blood pressure 121/90, pulse 74, temperature 97.7 F (36.5 C), temperature source Axillary, resp. rate (!) 29, height '5\' 11"'$  (1.803 m), weight 76.1 kg, SpO2 100 %.        Intake/Output Summary (Last 24 hours) at 02/18/2021 0743 Last data filed at 02/18/2021 0700 Gross per 24 hour  Intake 2650.88 ml  Output 4132 ml  Net -1481.12 ml    Filed Weights   02/16/21 0423 02/17/21 0216 02/18/21 0400  Weight: 81.3 kg 76.2 kg 76.1 kg    Examination: General: critically ill appearing man, sleeping in no acute distress HENT: [place]/AT, sclera icteric Lungs: CTA bilaterally, no wheezing or rhonchi, no increased work of breathing Cardiovascular: normal rate, s1, s2 present Abdomen: soft, mildly distended, active BS Extremities: BKA RLE, trace pitting edema of LLE Neuro: Sleeping, withdraws from pain, not following commands Skin: no rashes or ecchymoses  Resolved Hospital Problem list    Assessment & Plan:  Acute respiratory failure with hypoxia  -pulmonary hygiene  -O2 to maintain saturation >90%  Septic shock due to pneumonia -vasopressin, norepinephrine to maintain MAP>65 -midodrine 10 mg TID per tube -Last day of ceftriaxone today  -monitor CBC, fever curve  COVID pneumonia Off remdesivir due to elevated LFTs -dexamethasone 6 mg daily to complete 10 days  Acute metabolic encephalopathy Received one dose of ativan 0.5 mg overnight. More sleepy this morning  -Remains on precedex -stop ativan -EKG today -Seroquel '100mg'$  qhs -minimize sedation   ESRD on HD -nephrology consult; has femoral access -Continue CRRT  Acute liver injury  AST, ALT, Bili continue to rise. 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 Likely contributing to volume overload and acute hypoxic respiratory failure. Ongoing discussions  with family concerning poor prognosis and terminal medical conditions. -Last day of vancomycin today -CRRT -palliative care consult -Daughter to visit over the weekend  Best Practice (right click and "Reselect all SmartList Selections" daily)  Diet/type: tubefeeds and NPO DVT prophylaxis: not indicated GI prophylaxis: PPI Lines: Central line and Arterial Line Foley:  N/A Code Status:  DNR Last date of multidisciplinary goals of care discussion [7/13- daughter Patrick Anthony] Plan for family meeting with palliative today  Labs   CBC: Recent Labs  Lab 02/07/2021 0938 02/18/2021 0948 02/14/21 0354 02/14/21 0435 02/15/21 0225 02/15/21 0951 02/16/21 0412 02/16/21 1545 02/17/21 0327 02/18/21 0321  WBC 4.6  --  14.1*  --  22.1*  --  19.2*  --  16.7* 14.6*  NEUTROABS 3.2  --   --   --   --   --   --   --   --  12.0*  HGB 10.3*   < > 10.5*   < > 9.9* 13.3 9.7* 12.9* 9.0* 9.3*  HCT 36.8*   < > 35.9*   < > 33.7* 39.0 32.6* 38.0* 30.7* 29.9*  MCV 95.8  --  91.6  --  91.1  --  90.1  --  90.3 87.2  PLT 54*   < > 65*  --  78*  --  69*  --  34* 35*   < > = values in this interval not displayed.     Basic Metabolic Panel: Recent Labs  Lab 02/14/21 0354 02/14/21 0435 02/15/21 0225 02/15/21 0951 02/16/21 0412 02/16/21 1533 02/16/21 1545 02/17/21 0327 02/17/21 1600 02/18/21 0321  NA 132*   < > 132*   < > 129* 128* 131* 130* 131* 130*  K 4.5   < > 4.8   < > 3.7 3.4* 3.4* 3.7 4.0 3.6  CL 90*   < > 99   < > 98 99  --  100 101 102  CO2 16*   < > 13*   < > 14* 11*  --  13* 16* 20*  GLUCOSE 205*   < > 58*   < > 155* 147*  --  96 149* 200*  BUN 35*   < > 18   < > 12 9  --  '10 14 19  '$ CREATININE 5.19*   < > 2.68*   < > 1.68* 1.45*  --  1.40* 1.37* 1.31*  CALCIUM 7.6*   < > 7.4*   < > 7.5* 7.4*  --  7.3* 7.3* 7.3*  MG 1.9  --  2.5*  --  2.6*  --   --  2.4  --  2.7*  PHOS 5.4*   < > 4.6   < > 3.3 3.3  --  2.5 2.0* 1.4*   < > = values in this interval not displayed.    GFR: Estimated Creatinine  Clearance: 72.6 mL/min (A) (by C-G formula based on SCr of 1.31 mg/dL (H)). Recent Labs  Lab 02/14/21 0354 02/15/21 0225 02/15/21 0440 02/15/21 2058 02/16/21 0412 02/16/21 0757 02/17/21 0327 02/18/21 0321  WBC 14.1* 22.1*  --   --  19.2*  --  16.7* 14.6*  LATICACIDVEN >11.0*  --  >11.0* >11.0*  --  >11.0*  --   --      Liver Function Tests: Recent Labs  Lab 02/07/2021 [id number] 02/27/2021 1554 02/15/21 0225 02/15/21 1522 02/16/21 0412 02/16/21  1533 02/17/21 0327 02/17/21 1600 02/18/21 0321  AST 88*  --  2,004*  --  2,134*  --  2,318*  --  2,726*  ALT 25  --  618*  --  724*  --  818*  --  997*  ALKPHOS 86  --  104  --  106  --  115  --  148*  BILITOT 3.5*  --  4.5*  --  5.1*  --  6.3*  --  7.9*  PROT 7.9  --  7.0  --  7.1  --  6.9  --  7.1  ALBUMIN 2.2*   < > 2.0*  2.0*   < > 2.1*  2.1* 2.1* 1.8*  2.0* 2.0* 2.0*  2.0*   < > = values in this interval not displayed.    No results for input(s): LIPASE, AMYLASE in the last 168 hours. No results for input(s): AMMONIA in the last 168 hours.  ABG    Component Value Date/Time   PHART 7.367 02/16/2021 1545   PCO2ART 20.3 (L) 02/16/2021 1545   PO2ART 172 (H) 02/16/2021 1545   HCO3 12.0 (L) 02/16/2021 1545   TCO2 13 (L) 02/16/2021 1545   ACIDBASEDEF 12.0 (H) 02/16/2021 1545   O2SAT 100.0 02/16/2021 1545      Coagulation Profile: Recent Labs  Lab 02/27/2021 1936 02/15/21 0225 02/16/21 0412 02/17/21 0900 02/18/21 0321  INR 2.4* 3.1* 4.1* 4.8* 3.9*     Cardiac Enzymes: No results for input(s): CKTOTAL, CKMB, CKMBINDEX, TROPONINI in the last 168 hours.  HbA1C: Hgb A1c MFr Bld  Date/Time Value Ref Range Status  09/29/2020 04:35 AM 5.5 4.8 - 5.6 % Final    Comment:    (NOTE) Pre diabetes:          5.7%-6.4%  Diabetes:              >6.4%  Glycemic control for   <7.0% adults with diabetes   09/28/2020 08:27 AM 5.4 4.8 - 5.6 % Final    Comment:    (NOTE) Pre diabetes:          5.7%-6.4%  Diabetes:               >6.4%  Glycemic control for   <7.0% adults with diabetes     CBG: Recent Labs  Lab 02/17/21 1925 02/17/21 2308 02/18/21 0318 02/18/21 0733 02/18/21 0736  GLUCAP 145* 213* 201* 64* 204*     Review of Systems:   Unable to be obtained due to encephalopathy.  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   Surgical History:   Past Surgical History:  Procedure Laterality Date   AMPUTATION Right 10/29/2020   Procedure: RIGHT ABOVE KNEE AMPUTATION;  Surgeon: [name], MD;  Location: [place];  Service: Orthopedics;  Laterality: Right;   AV FISTULA PLACEMENT Right    AV FISTULA PLACEMENT Left 09/03/2013   Procedure: ARTERIOVENOUS (AV) FISTULA CREATION - LEFT BRACHIOCEPHALIC;  Surgeon: Conrad Elwood, MD;  Location: [facility];  Service: Vascular;  Laterality: Left;   AV FISTULA PLACEMENT Left 10/30/2019   Procedure: INSERTION OF ARTERIOVENOUS (AV) GORE-TEX GRAFT ARM;  Surgeon: Rosetta Posner, MD;  Location: [place];  Service: Vascular;  Laterality: Left;   BUBBLE STUDY  10/05/2020   Procedure: BUBBLE STUDY;  Surgeon: Pixie Casino, MD;  Location: [facility];  Service: Cardiovascular;;   I & D EXTREMITY Right 10/20/2020   Procedure: IRRIGATION AND DEBRIDEMENT OF LEG;  Surgeon: [name], MD;  Location: [facility];  Service: Orthopedics;  Laterality: Right;   INSERTION OF DIALYSIS CATHETER N/A 08/29/2013   Procedure: INSERTION OF DIALYSIS CATHETER;  Surgeon: Mal Misty, MD;  Location: [facility];  Service: Vascular;  Laterality: N/A;    INSERTION OF DIALYSIS CATHETER Left 10/30/2019   Procedure: INSERTION OF DIALYSIS CATHETER LEFT INTERNAL JUGULAR;  Surgeon: Rosetta Posner, MD;  Location: [facility];  Service: Vascular;  Laterality: Left;   LEFT HEART CATH AND CORONARY ANGIOGRAPHY N/A 10/07/2020   Procedure: LEFT HEART CATH AND CORONARY ANGIOGRAPHY;  Surgeon: Lorretta Harp, MD;  Location: [facility];  Service: Cardiovascular;  Laterality: N/A;   MITRAL VALVE REPAIR Right 10/14/2020   Procedure: MINIMALLY INVASIVE MITRAL VALVE  REPLACEMENT(MVR) USING MEDTRONIC MOSAIC VALVE SIZE 31MM;  Surgeon: Rexene Alberts, MD;  Location: [facility];  Service: Open Heart Surgery;  Laterality: Right;   MULTIPLE EXTRACTIONS WITH ALVEOLOPLASTY N/A 10/12/2020   Procedure: MULTIPLE EXTRACTION WITH ALVEOLOPLASTY;  Surgeon: [name], DMD;  Location: [facility];  Service: Dentistry;  Laterality: N/A;   PERIPHERAL VASCULAR CATHETERIZATION N/A 01/06/2015   Procedure: A/V Shuntogram/Fistulagram;  Surgeon: Katha Cabal, MD;  Location: [place] [facility];  Service: Cardiovascular;  Laterality: N/A;   PERIPHERAL VASCULAR CATHETERIZATION Left 01/06/2015   Procedure: A/V Shunt Intervention;  Surgeon: Katha Cabal, MD;  Location: [place] [facility];  Service: Cardiovascular;  Laterality: Left;   REPAIR OF PATENT FORAMEN OVALE N/A 10/14/2020   Procedure: CLOSURE OF PATENT FORAMEN OVALE;  Surgeon: Rexene Alberts, MD;  Location: [facility] [place];  Service: Open Heart Surgery;  Laterality: N/A;   TEE WITHOUT CARDIOVERSION N/A 10/05/2020   Procedure: TRANSESOPHAGEAL ECHOCARDIOGRAM (TEE);  Surgeon: Pixie Casino, MD;  Location: [facility];  Service: Cardiovascular;  Laterality: N/A;   TEE WITHOUT CARDIOVERSION N/A 10/14/2020   Procedure: TRANSESOPHAGEAL ECHOCARDIOGRAM (TEE);  Surgeon: Rexene Alberts, MD;  Location: [facility];  Service: Open Heart Surgery;  Laterality: N/A;   TEE WITHOUT CARDIOVERSION N/A 12/24/2020   Procedure: TRANSESOPHAGEAL ECHOCARDIOGRAM (TEE);   Surgeon: Pixie Casino, MD;  Location: [facility];  Service: Cardiovascular;  Laterality: N/A;     Social History:   reports that he quit smoking about 3 years ago. His smoking use included cigarettes. He smoked an average of .2 packs per day. He has never used smokeless tobacco. He reports that he does not drink alcohol and does not use drugs.   Family History:  His family history includes Cancer in his mother; Hypertension in his mother.   Allergies No Known Allergies   Home Medications  Prior to Admission medications   Medication Sig Start Date End Date Taking? Authorizing Provider  acetaminophen (TYLENOL) 325 MG tablet Take 325-650 mg by mouth every 6 (six) hours as needed for mild pain or fever. Temp > 100.5    [provider]  aspirin EC 325 MG EC tablet Take 1 tablet (325 mg total) by mouth daily. 11/10/20   Samella Parr, NP  atorvastatin (LIPITOR) 40 MG tablet Take 1 tablet (40 mg total) by mouth daily. 01/27/21   Dessa Phi, DO  bisacodyl (DULCOLAX) 10 MG suppository Place 1 suppository (10 mg total) rectally daily as needed for moderate constipation. 11/09/20   Samella Parr, NP  calcium acetate (PHOSLO) 667 MG capsule Take 667 mg by mouth 3 (three) times daily with meals.    [provider]  cinacalcet (SENSIPAR) 30 MG tablet Take 2 tablets (60 mg total) by mouth daily with breakfast. 11/10/20   Samella Parr, NP  Darbepoetin Alfa (ARANESP, ALBUMIN FREE, IJ) Darbepoetin Alfa (Aranesp) 07/05/20 07/04/21  [provider]  diltiazem (CARDIZEM CD) 120 MG 24 hr capsule Take 1 capsule (120 mg total) by mouth daily. 01/07/21   Ghimire, Henreitta Leber, MD  docusate sodium (COLACE) 100 MG capsule Take 1 capsule (100 mg total) by mouth 2 (two) times daily. 11/09/20   Samella Parr, NP  methocarbamol (ROBAXIN) 500 MG tablet Take 1 tablet (500 mg total) by mouth 3 (three) times daily. 11/09/20   Samella Parr, NP  metoprolol tartrate (LOPRESSOR) 50 MG tablet  Take 0.5 tablets (25 mg total) by mouth 2 (two) times daily. 01/06/21   Ghimire, Henreitta Leber, MD  Multiple Vitamin (MULTIVITAMIN WITH MINERALS) TABS tablet Take 1 tablet by mouth daily.    [provider]  nitroGLYCERIN (NITROSTAT) 0.4 MG SL tablet Place 0.4 mg under the tongue every 5 (five) minutes x 3 doses as needed for chest pain.     [provider]  Nutritional Supplements (,FEEDING SUPPLEMENT, PROSOURCE PLUS) liquid Take 30 mLs by mouth 2 (two) times daily between meals. 11/09/20   Samella Parr, NP  Nutritional Supplements (FEEDING SUPPLEMENT, NEPRO CARB STEADY,) LIQD Take 237 mLs by mouth 3 (three) times daily between meals. 11/09/20   Samella Parr, NP  pantoprazole (PROTONIX) 40 MG tablet Take 1 tablet (40 mg total) by mouth daily. 11/10/20   Samella Parr, NP  polyethylene glycol (MIRALAX / GLYCOLAX) 17 g packet Take 17 g by mouth daily as needed for mild constipation. 11/09/20   Samella Parr, NP  pregabalin (LYRICA) 75 MG capsule Take 1 capsule (75 mg total) by mouth at bedtime. 11/09/20   Samella Parr, NP  senna-docusate (SENOKOT-S) 8.6-50 MG tablet Take 1 tablet by mouth 2 (two) times daily. 11/09/20   Samella Parr, NP  sevelamer carbonate (RENVELA) 800 MG tablet Take 1,600 mg by mouth 3 (three) times daily with meals.    [provider]  Vancomycin (VANCOCIN) 750-5 MG/150ML-% SOLN Inject 150 mLs (750 mg total) into the vein every Monday, Wednesday, and Friday with hemodialysis. 6 weeks of vancomycin from 5/27 12/31/20   Jonetta Osgood, MD     Iona Beard, MD 02/18/21 7:43 AM Internal Medicine, PGY-2 Pager [phone]

## 2021-02-19 DIAGNOSIS — J9601 Acute respiratory failure with hypoxia: Secondary | ICD-10-CM | POA: Diagnosis not present

## 2021-02-19 DIAGNOSIS — J1282 Pneumonia due to coronavirus disease 2019: Secondary | ICD-10-CM | POA: Diagnosis present

## 2021-02-19 DIAGNOSIS — Z79891 Long term (current) use of opiate analgesic: Secondary | ICD-10-CM

## 2021-02-19 DIAGNOSIS — U071 COVID-19: Secondary | ICD-10-CM | POA: Diagnosis present

## 2021-02-19 DIAGNOSIS — E7220 Disorder of urea cycle metabolism, unspecified: Secondary | ICD-10-CM | POA: Diagnosis present

## 2021-02-19 DIAGNOSIS — R000 Tachycardia, unspecified: Secondary | ICD-10-CM | POA: Diagnosis not present

## 2021-02-19 DIAGNOSIS — K7291 Hepatic failure, unspecified with coma: Secondary | ICD-10-CM | POA: Diagnosis not present

## 2021-02-19 DIAGNOSIS — Z66 Do not resuscitate: Secondary | ICD-10-CM | POA: Diagnosis not present

## 2021-02-19 LAB — RENAL FUNCTION PANEL
Albumin: 1.9 g/dL — ABNORMAL LOW (ref 3.5–5.0)
Anion gap: 7 (ref 5–15)
BUN: 25 mg/dL — ABNORMAL HIGH (ref 6–20)
CO2: 22 mmol/L (ref 22–32)
Calcium: 7.3 mg/dL — ABNORMAL LOW (ref 8.9–10.3)
Chloride: 104 mmol/L (ref 98–111)
Creatinine, Ser: 1.21 mg/dL (ref 0.61–1.24)
GFR, Estimated: >60 mL/min (ref >60)
Glucose, Bld: 172 mg/dL — ABNORMAL HIGH (ref 70–99)
Phosphorus: <1 mg/dL — CL (ref 2.5–4.6)
Potassium: 2.9 mmol/L — ABNORMAL LOW (ref 3.5–5.1)
Sodium: 133 mmol/L — ABNORMAL LOW (ref 135–145)

## 2021-02-19 LAB — MAGNESIUM: Magnesium: 2.6 mg/dL — ABNORMAL HIGH (ref 1.7–2.4)

## 2021-02-19 LAB — POCT I-STAT 7, (LYTES, BLD GAS, ICA,H+H)
Acid-base deficit: 6 mmol/L — ABNORMAL HIGH (ref 0.0–2.0)
Bicarbonate: 17.9 mmol/L — ABNORMAL LOW (ref 20.0–28.0)
Calcium, Ion: 0.98 mmol/L — ABNORMAL LOW (ref 1.15–1.40)
HCT: 40 % (ref 39.0–52.0)
Hemoglobin: 13.6 g/dL (ref 13.0–17.0)
O2 Saturation: 90 %
Patient temperature: 99.5
Potassium: 4.8 mmol/L (ref 3.5–5.1)
Sodium: 138 mmol/L (ref 135–145)
TCO2: 19 mmol/L — ABNORMAL LOW (ref 22–32)
pCO2 arterial: 31.2 mmHg — ABNORMAL LOW (ref 32.0–48.0)
pH, Arterial: 7.368 (ref 7.350–7.450)
pO2, Arterial: 60 mmHg — ABNORMAL LOW (ref 83.0–108.0)

## 2021-02-19 LAB — GLUCOSE, CAPILLARY
Glucose-Capillary: 219 mg/dL — ABNORMAL HIGH (ref 70–99)
Glucose-Capillary: 172 mg/dL — ABNORMAL HIGH (ref 70–99)
Glucose-Capillary: 174 mg/dL — ABNORMAL HIGH (ref 70–99)
Glucose-Capillary: 213 mg/dL — ABNORMAL HIGH (ref 70–99)
Glucose-Capillary: 246 mg/dL — ABNORMAL HIGH (ref 70–99)
Glucose-Capillary: 117 mg/dL — ABNORMAL HIGH (ref 70–99)
Glucose-Capillary: 169 mg/dL — ABNORMAL HIGH (ref 70–99)

## 2021-02-19 LAB — PHOSPHORUS: Phosphorus: 2.8 mg/dL (ref 2.5–4.6)

## 2021-02-19 MED ORDER — DEXMEDETOMIDINE HCL IN NACL 400 MCG/100ML IV SOLN
0.4000 ug/kg/h | INTRAVENOUS | Status: DC
Start: 2021-02-19 — End: 2021-02-20
  Administered 2021-02-19 (×2): 1.2 ug/kg/h via INTRAVENOUS
  Filled 2021-02-19 (×2): qty 100

## 2021-02-19 MED ORDER — POTASSIUM CHLORIDE 20 MEQ PO PACK
40.0000 meq | PACK | ORAL | Status: AC
  Administered 2021-02-19 (×2): 40 meq
  Filled 2021-02-19 (×2): qty 2

## 2021-02-19 MED ORDER — HEPARIN SODIUM (PORCINE) 1000 UNIT/ML DIALYSIS
1000.0000 [IU] | INTRAMUSCULAR | Status: DC | PRN
  Administered 2021-02-19 (×2): 1400 [IU] via INTRAVENOUS_CENTRAL
  Filled 2021-02-19 (×3): qty 6
  Filled 2021-02-19: qty 3

## 2021-02-19 MED ORDER — HALOPERIDOL LACTATE 5 MG/ML IJ SOLN
5.0000 mg | Freq: Once | INTRAMUSCULAR | Status: AC
  Administered 2021-02-19: 5 mg via INTRAVENOUS
  Filled 2021-02-19: qty 1

## 2021-02-19 MED ORDER — SODIUM BICARBONATE 8.4 % IV SOLN
50.0000 meq | Freq: Once | INTRAVENOUS | Status: AC
  Administered 2021-02-19: 50 meq via INTRAVENOUS
  Filled 2021-02-19: qty 50

## 2021-02-19 MED ORDER — PRISMASOL BGK 4/2.5 32-4-2.5 MEQ/L REPLACEMENT SOLN
Status: DC
  Filled 2021-02-19: qty 5000

## 2021-02-19 MED ORDER — MORPHINE SULFATE (PF) 2 MG/ML IV SOLN
2.0000 mg | INTRAVENOUS | Status: DC | PRN
  Administered 2021-02-19 (×2): 2 mg via INTRAVENOUS
  Filled 2021-02-19 (×2): qty 1

## 2021-02-19 MED ORDER — POTASSIUM PHOSPHATES 15 MMOLE/5ML IV SOLN
30.0000 mmol | Freq: Once | INTRAVENOUS | Status: AC
  Administered 2021-02-19: 30 mmol via INTRAVENOUS
  Filled 2021-02-19: qty 10

## 2021-02-19 MED ORDER — METOPROLOL TARTRATE 5 MG/5ML IV SOLN
5.0000 mg | Freq: Four times a day (QID) | INTRAVENOUS | Status: DC | PRN
  Administered 2021-02-19: 5 mg via INTRAVENOUS
  Filled 2021-02-19: qty 5

## 2021-02-19 MED ORDER — QUETIAPINE FUMARATE 50 MG PO TABS
50.0000 mg | ORAL_TABLET | Freq: Every day | ORAL | Status: DC
  Administered 2021-02-19: 50 mg
  Filled 2021-02-19: qty 1

## 2021-02-19 MED ORDER — LORAZEPAM 2 MG/ML IJ SOLN
1.0000 mg | Freq: Four times a day (QID) | INTRAMUSCULAR | Status: DC | PRN
  Administered 2021-02-19: 1 mg via INTRAVENOUS
  Filled 2021-02-19: qty 1

## 2021-02-19 MED ORDER — MIDODRINE HCL 5 MG PO TABS: 5.0000 mg | ORAL_TABLET | Freq: Three times a day (TID) | ORAL | Status: DC

## 2021-02-19 NOTE — Progress Notes (Addendum)
[facility] Progress Note  Subjective: pt not responsive, very lethargic. Off pressors today    Vitals:   02/19/21 1000 02/19/21 1100 02/19/21 1136 02/19/21 1200  BP:      Pulse: 79 93 73 77  Resp: (!) 25 12 (!) 28 17  Temp:   (!) 97.5 F (36.4 C)   TempSrc:   Axillary   SpO2: 97% 100% 99% 100%  Weight:      Height:        Exam:   Ill appearing AAM, somnolent   No jvd   Chest cta bilat ant/ lat   Cor reg no RG    Abd soft ntnd, +BS    Ext R AKA, LLE no edema, bilat 1-2+ UE edema    Neuro somnolent, not following commands    AVG LUA+b    OP HD: [facility]WF [facility]   4h  350/500  66.5kg  2/2 bath  P1  AVG LUA  - mircera 150 ug q2 wks   Assessment/ Plan: ESRD - usual HD MWF. Getting CRRT here started 7/10. Will dc CRRT today as pt is now off pressors. Would recommend only iHD from here forward as pt is poor candidate for returning to CRRT.   Septic shock / PNA - resolved, off pressors today Volume - weights aren't accurate. 8kg up by wts, but I/O's are even. UE edema on exam.  COVID-19 positive - per CCM AHRF - 2/2 pneumonia and parapneumonic effusion, self-extubated. Getting HFNC.  Anemia ckd: Hb 9's. Currently receiving mircera 152mg. Last dose 6/27 > have ordered weekly darbe IV 150ug here.  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, resume binders when taking PO  DNR - prognosis guarded     Rob Latavion Halls 02/19/2021, 12:58 PM   Recent Labs  Lab 02/17/21 0327 02/17/21 1600 02/18/21 0321 02/18/21 1542 02/19/21 0415  K 3.7   < > 3.6 4.1 2.9*  BUN 10   < > 19 22* 25*  CREATININE 1.40*   < > 1.31* 1.21 1.21  CALCIUM 7.3*   < > 7.3* 7.2* 7.3*  PHOS 2.5   < > 1.4* 2.2* <1.0*  HGB 9.0*  --  9.3*  --   --    < > = values in this interval not displayed.    Inpatient medications:  B-complex with vitamin C  1 tablet Per Tube Daily   chlorhexidine  15 mL Mouth Rinse BID   Chlorhexidine Gluconate Cloth  6 each Topical Daily   darbepoetin (ARANESP) injection - NON-DIALYSIS   150 mcg Subcutaneous Q Wed-1800   dexamethasone (DECADRON) injection  6 mg Intravenous Q24H   insulin aspart  0-6 Units Subcutaneous Q4H   insulin aspart  2 Units Subcutaneous Q4H   lactulose  20 g Per Tube BID   mouth rinse  15 mL Mouth Rinse q12n4p   midodrine  10 mg Per Tube TID WC   pantoprazole sodium  40 mg Per Tube QHS   potassium chloride  40 mEq Per Tube Q4H   QUEtiapine  50 mg Per Tube QHS   sodium chloride flush  10-40 mL Intracatheter Q12H     prismasol BGK 4/2.5 400 mL/hr at 02/19/21 1129    prismasol BGK 4/2.5 200 mL/hr at 02/19/21 0605   dexmedetomidine (PRECEDEX) IV infusion 0.5 mcg/kg/hr (02/19/21 1100)   feeding supplement (PIVOT 1.5 CAL) 65 mL/hr at 02/18/21 1300   potassium PHOSPHATE IVPB (in mmol)     prismasol BGK 4/2.5 1,250 mL/hr at 02/19/21 1131  docusate, heparin, polyethylene glycol, sodium chloride flush

## 2021-02-19 NOTE — Progress Notes (Signed)
NAME:  Patrick Anthony, MRN:  [id number], DOB:  1970-08-10, LOS: 6 ADMISSION DATE:  02/12/2021, CONSULTATION DATE:  02/19/2021 REFERRING MD:  Dina Rich- TRH, CHIEF COMPLAINT:  respiratory failure on vent   History of Present Illness:  Patrick Anthony is a 50 y/o gentleman with a history of endocarditis and diskitis with recent bacteremia, ESRD who presented from [place] with hypoglycemia after he was found unresponsive this morning. He was diaphoretic but did not regain consciousness when given supplemental dextrose. He developed progressive hypoxia and required intubation. He has a history of MRSA aortic endocarditis, diskitis, and bacteremia in February & June 2022, bioprosthetic MVR, HFpEF, chronic PH, ESRD on HD, BKA. He was discharged on vancomycin; last dose unknown. History obtained from chart review due to patient's encephalopathy. Received ceftriaxone and azithromycin in the ED and was started on vasopressors for shock.  Pertinent  Medical History  MRSA aortic endocarditis, diskitis, and bacteremia in June 2022 bioprosthetic MVR, HFpEF, chronic PH, PFI ESRD on HD BKA Cirrhosis OSA  Significant Hospital Events: Including procedures, antibiotic start and stop dates in addition to other pertinent events   7/10 admitted, intubated, on pressors 7/11 self extubated, off sedation 7/13 Cortrak placed started on tube feeds  Interim History / Subjective:   Diarrhea overnight, Flexi-Seal placed, after starting lactulose I/O- 771 cc total Significant hypokalemia, hypophosphatemia 7/16 High flow nasal cannula 4 L/min Precedex 0.6 Norepinephrine weaned to off, vasopressin off  Objective   Blood pressure 121/90, pulse 87, temperature 98 F (36.7 C), temperature source Axillary, resp. rate 17, height '5\' 11"'$  (1.803 m), weight 74.8 kg, SpO2 100 %.        Intake/Output Summary (Last 24 hours) at 02/19/2021 0746 Last data filed at 02/19/2021 0700 Gross per 24 hour  Intake 2847.19 ml  Output  4660 ml  Net -1812.81 ml   Filed Weights   02/17/21 0216 02/18/21 0400 02/19/21 0423  Weight: 76.2 kg 76.1 kg 74.8 kg   Examination: General: Critically ill-appearing man, no distress HENT: Oropharynx clear, no lesions, sclera icteric, pupils equal Lungs: Clear bilaterally, distant, no wheeze or crackles Cardiovascular: Regular, no murmur Abdomen: Nondistended, positive bowel sounds Extremities: Right BKA, trace lower extremity edema on the left Neuro: More awake.  Eyes open, tracks, moves bilateral upper extremities.  Attempting to interact, garbled speech on Precedex 0.7 Skin: No rash  Resolved Hospital Problem list    Assessment & Plan:  Acute respiratory failure with hypoxia  -Continue pulmonary hygiene -Wean O2 as able, has made some progress, currently on 4 L  Septic shock due to pneumonia (presumed bacterial superimposed on COVID), resolved.  Significant hemodynamic improvement -Norepinephrine and vasopressin now discontinued -Continue midodrine 10 mg 3 times daily -completed ceftriaxone course 7/15 -follow CBC, fever curve  COVID pneumonia Off remdesivir due to elevated LFTs -continue dexamethasone 6 mg daily to complete 10 days  Acute metabolic encephalopathy. Contribution hepatic dysfunction as below Improved on 7/16 but not at baseline -Remains on Precedex, 0.07 -hold off on any ativan -decreased Seroquel to '50mg'$  qhs on 7/16 -lactulose '20mg'$  bid  ESRD on HD -appreciate nephrology consult; has femoral access -On CVVH currently, with hemodynamic improvement suspect we can come off continuous HD and transition to intermittent.  Will discuss with nephrology  Hypophosphatemia and hypokalemia  -replete electrolytes 7/16 and follow   Acute liver injury  AST, ALT, Bili continue to rise. 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 Ammonia 125 on 7/16 -Continue lactulose 20 mg BID -Consider repeat ammonia in  about 2 days to ensure downtrending -Follow LFT  intermittently  Diarrhea, likely due to lactulose -need to continue the lactulose for now -flexiseal in place  Hypoglycemia due to sepsis - resolved Glucose in 200s this morning. 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 Likely contributing to volume overload and acute hypoxic respiratory failure. Ongoing discussions with family concerning poor prognosis and terminal medical conditions. -vanco course completed 7/15 -appreciate palliative care consultation as this issue will be chronic and will affect longterm recovery potential   Best Practice (right click and "Reselect all SmartList Selections" daily)  Diet/type: tubefeeds and NPO DVT prophylaxis: not indicated GI prophylaxis: PPI Lines: Central line and Arterial Line Foley:  N/A Code Status:  DNR Last date of multidisciplinary goals of care discussion [7/13- daughter Ishara] family mtg w palliative care 7/14-15  Labs   CBC: Recent Labs  Lab 02/23/2021 0938 02/05/2021 0948 02/14/21 0354 02/14/21 0435 02/15/21 0225 02/15/21 0951 02/16/21 0412 02/16/21 1545 02/17/21 0327 02/18/21 0321  WBC 4.6  --  14.1*  --  22.1*  --  19.2*  --  16.7* 14.6*  NEUTROABS 3.2  --   --   --   --   --   --   --   --  12.0*  HGB 10.3*   < > 10.5*   < > 9.9* 13.3 9.7* 12.9* 9.0* 9.3*  HCT 36.8*   < > 35.9*   < > 33.7* 39.0 32.6* 38.0* 30.7* 29.9*  MCV 95.8  --  91.6  --  91.1  --  90.1  --  90.3 87.2  PLT 54*   < > 65*  --  78*  --  69*  --  34* 35*   < > = values in this interval not displayed.     Basic Metabolic Panel: Recent Labs  Lab 02/15/21 0225 02/15/21 0951 02/16/21 0412 02/16/21 1533 02/17/21 0327 02/17/21 1600 02/18/21 0321 02/18/21 1542 02/19/21 0415  NA 132*   < > 129*   < > 130* 131* 130* 132* 133*  K 4.8   < > 3.7   < > 3.7 4.0 3.6 4.1 2.9*  CL 99   < > 98   < > 100 101 102 100 104  CO2 13*   < > 14*   < > 13* 16* 20* 21* 22  GLUCOSE 58*   < > 155*   < > 96 149* 200* 209* 172*  BUN 18   < > 12   < > '10 14 19 '$ 22* 25*  CREATININE 2.68*   < > 1.68*   < > 1.40* 1.37* 1.31* 1.21 1.21  CALCIUM 7.4*   < > 7.5*   < > 7.3* 7.3* 7.3* 7.2* 7.3*  MG 2.5*  --  2.6*  --  2.4  --  2.7*  --  2.6*  PHOS 4.6   < > 3.3   < > 2.5 2.0* 1.4* 2.2* <1.0*   < > = values in this interval not displayed.   GFR: Estimated Creatinine Clearance: 78.1 mL/min (by C-G formula based on SCr  of 1.21 mg/dL). Recent Labs  Lab 02/14/21 0354 02/15/21 0225 02/15/21 0440 02/15/21 2058 02/16/21 0412 02/16/21 0757 02/17/21 0327 02/18/21 0321  WBC 14.1* 22.1*  --   --  19.2*  --  16.7* 14.6*  LATICACIDVEN >11.0*  --  >11.0* >11.0*  --  >11.0*  --   --     Liver Function Tests: Recent Labs  Lab 02/05/2021 0938  1554 02/15/21 0225 02/15/21 1522 02/16/21 0412 02/16/21 1533 02/17/21 0327 02/17/21 1600 02/18/21 0321 02/18/21 1542 02/19/21 0415  AST 88*  --  2,004*  --  2,134*  --  2,318*  --  2,726*  --   --   ALT 25  --  618*  --  724*  --  818*  --  997*  --   --   ALKPHOS 86  --  104  --  106  --  115  --  148*  --   --   BILITOT 3.5*  --  4.5*  --  5.1*  --  6.3*  --  7.9*  --   --   PROT 7.9  --  7.0  --  7.1  --  6.9  --  7.1  --   --   ALBUMIN 2.2*   < > 2.0*  2.0*   < > 2.1*  2.1*   < > 1.8*  2.0* 2.0* 2.0*  2.0* 2.0* 1.9*   < > = values in this interval not displayed.   No results for input(s): LIPASE, AMYLASE in the last 168 hours. Recent Labs  Lab 02/18/21 1141  AMMONIA 126*    ABG    Component Value Date/Time   PHART 7.367 02/16/2021 1545    PCO2ART 20.3 (L) 02/16/2021 1545   PO2ART 172 (H) 02/16/2021 1545   HCO3 12.0 (L) 02/16/2021 1545   TCO2 13 (L) 02/16/2021 1545   ACIDBASEDEF 12.0 (H) 02/16/2021 1545   O2SAT 100.0 02/16/2021 1545      Coagulation Profile: Recent Labs  Lab 03/01/2021 1936 02/15/21 0225 02/16/21 0412 02/17/21 0900 02/18/21 0321  INR 2.4* 3.1* 4.1* 4.8* 3.9*    Cardiac Enzymes: No results for input(s): CKTOTAL, CKMB, CKMBINDEX, TROPONINI in the last 168 hours.  HbA1C: Hgb A1c MFr Bld  Date/Time Value Ref Range Status  09/29/2020 04:35 AM 5.5 4.8 - 5.6 % Final    Comment:    (NOTE) Pre diabetes:          5.7%-6.4%  Diabetes:              >6.4%  Glycemic control for   <7.0% adults with diabetes   09/28/2020 08:27 AM 5.4 4.8 - 5.6 % Final    Comment:    (NOTE) Pre diabetes:          5.7%-6.4%  Diabetes:              >6.4%  Glycemic control for   <7.0% adults with diabetes     CBG: Recent Labs  Lab 02/18/21 1141 02/18/21 1530 02/18/21 1931 02/18/21 2355 02/19/21 0420  GLUCAP 219* 204* 228* 219* 172*    Independent CC time 33 minutes  Baltazar Apo, MD, PhD 02/19/2021, 7:50 AM  Pulmonary and Critical Care [phone] or if no answer before 7:00PM call [phone] For any issues after 7:00PM please call eLink [phone]

## 2021-02-19 NOTE — Progress Notes (Addendum)
E-link was called at approximately 1905 after report was given to me as patient was found to have HR of 156-160, BP of 173/86 with MAP of 109, and oxygen sats at 69% on 8L nasal cannula. Subsequently , patient placed on 15 L HFNC and non-rebreather, Precedex increased to max, and PRN Ativan given to patient while awaiting new orders. Daughter then arrived and I requested that someone come see her and again explain how sick her father was and discuss goals of care as patient has had such a decline in the last hour. Dr. Carson Myrtle came to speak with daughter and again discussed to confirm family did not want patient to be intubated if necessary which she confirmed but did not want to make patient comfort care. Per conversation with Dr. Carson Myrtle order for Metoprolol and Morphine placed and given to patient with improvement of HR and BP. Dr. Carson Myrtle made aware that respiratory rate was still in the 40s and ABG order was placed and drawn. Will continue to monitor and reach out to providers as neccessary throughout the shift with any new changes to patient status.

## 2021-02-19 NOTE — Progress Notes (Addendum)
OVERNIGHT COVERAGE CRITICAL CARE PROGRESS NOTE  Patient's daughter from [place], [place] arrived to visit this pm.  Called by RN to speak with her.  RN reports that the patient has had progressively increasing oxygen requirement throughout the day.  Charting suggests that he was on 4 LPM this am but now is requiring 8 LPM HFNC and experiencing oxygen desaturation below 90%.   The daughter has not yet had a chance to physically visit with the patient; however, she was able to verbalize understanding of the severity of the patient's clinical condition.  She does understand that the patient has a poor prognosis and agrees with the current DNR/DNI status.  She states that she is not yet ready to transition to comfort care but "is ready to change to comfort only when it becomes clear that [no other interventions, short of intubation, remain]".  Of note, the patient is on Lopressor and Cardizem CD at home, which he has not been getting during his hospitalization.  He is now hypertensive and tachycardic with Precedex running at 1.5 mcg/kg/min.  There is a correlation in this patient with oxygen desaturation/increasing oxygen requirement and increased heart rate.  While this could be multifactorial, given his current hypertensive status, it would be reasonable to reintroduce some of his home medications to see if controlling his heart rate results in improved oxygenation.  Review of home medications reveals chronic use of rate lowering cardiac medications and oxycodone.  Withdrawal?    Principal Problem:   Acute respiratory failure with hypoxia (HCC) Active Problems:   Acute liver failure without hepatic coma   Pneumonia due to COVID-19 virus   HTN (hypertension)   DNR (do not resuscitate)   Tachycardia   Chronic prescription opiate use   S/P minimally-invasive mitral valve replacement with bioprosthetic valve  Metoprolol 5 mg IV q 6 hr PRN for now.  He takes Lopressor 50 mg PO BID and Cardizem CD 120  mg PO daily Morphine sulfate 2 mg IV q 2 hr PRN for pain.  He is on oxycodone 15 mg PO BID and is currently not getting any narcotic analgesic.    Code Status: DNR  Critical care time: 30 minutes.  The treatment and management of the patient's condition was required based on the threat of imminent deterioration. This time reflects time spent by the physician evaluating, providing care and managing the critically ill patient's care. The time was spent at the immediate bedside (or on the same floor/unit and dedicated to this patient's care). Time involved in separately billable procedures is NOT included int he critical care time indicated above. Family meeting and update time may be included above if and only if the patient is unable/incompetent to participate in clinical interview and/or decision making, and the discussion was necessary to determining treatment decisions.  Renee Pain, MD Board Certified by the [employer], [place]

## 2021-02-20 MED ORDER — DOPAMINE-DEXTROSE 3.2-5 MG/ML-% IV SOLN: 0.0000 ug/kg/min | INTRAVENOUS | Status: DC

## 2021-02-20 MED ORDER — DEXTROSE 50 % IV SOLN
INTRAVENOUS | Status: AC
  Filled 2021-02-20: qty 50

## 2021-02-20 MED ORDER — ATROPINE SULFATE 1 MG/10ML IJ SOSY: 1.0000 mg | PREFILLED_SYRINGE | Freq: Once | INTRAMUSCULAR | Status: AC

## 2021-02-20 MED ORDER — NOREPINEPHRINE 4 MG/250ML-% IV SOLN
INTRAVENOUS | Status: AC
  Filled 2021-02-20: qty 250

## 2021-02-20 MED ORDER — SODIUM BICARBONATE 8.4 % IV SOLN: 100.0000 meq | Freq: Once | INTRAVENOUS | Status: AC

## 2021-02-20 MED ORDER — CALCIUM CHLORIDE 10 % IV SOLN
1.0000 g | Freq: Once | INTRAVENOUS | Status: AC
  Administered 2021-02-20: 1 g via INTRAVENOUS

## 2021-02-20 MED ORDER — SODIUM BICARBONATE 8.4 % IV SOLN
INTRAVENOUS | Status: AC
  Administered 2021-02-20: 100 meq via INTRAVENOUS
  Filled 2021-02-20: qty 100

## 2021-02-20 MED ORDER — ATROPINE SULFATE 1 MG/10ML IJ SOSY
PREFILLED_SYRINGE | INTRAMUSCULAR | Status: AC
  Administered 2021-02-20: 1 mg via INTRAVENOUS
  Filled 2021-02-20: qty 10

## 2021-02-20 MED ORDER — DOPAMINE-DEXTROSE 3.2-5 MG/ML-% IV SOLN
INTRAVENOUS | Status: AC
  Administered 2021-02-20: 15 ug/kg/min via INTRAVENOUS
  Filled 2021-02-20: qty 250

## 2021-02-20 MED ORDER — ATROPINE SULFATE 1 MG/10ML IJ SOSY
PREFILLED_SYRINGE | INTRAMUSCULAR | Status: AC
  Administered 2021-02-20: 1 mg via INTRAVENOUS
  Filled 2021-02-20: qty 20

## 2021-03-02 ENCOUNTER — Ambulatory Visit: Payer: Medicaid Other | Admitting: Internal Medicine

## 2021-03-07 NOTE — Progress Notes (Signed)
[facility] Progress Note Patient Name: Traeshawn Albany DOB: 1971-04-20 MRN: [id number]   Date of Service  02-24-21  HPI/Events of Note  Patient abruptly developed profound bradycardia and hypotension, EKG showed an idioventricular rhythm with rate in the 30's.  eICU Interventions  Atropine 1 mg iv x 2, sodium bicarbonate 100 meq iv bolus, Dopamine gtt started at 15 mcg, calcium chloride 1 gm iv , despite these interventions introduced in rapid sequence the patient progressed to asystole, in line with the DNR order in the chart he was not resuscitated and expired shortly thereafter. CRRT was stopped earlier today for reasons of futility. Bedside RN is notifying his daughter.         Kerry Kass Seymour Pavlak 24-Feb-2021, 1:56 AM

## 2021-03-07 NOTE — Progress Notes (Addendum)
Elink called at 0100 due to Aline reading of BP pressure of 94/39 and MAP of 58 with change in Aline wave form, BP cuff reading of 99/68 with MAP of 77. ART line zeroed and scale optimized with no change in pressure. Per Elink go by cuff pressure at this time. Additionally respiratory called to see if they were able to redress and change Aline set up to see if that would help with wave form changes per Elink recommendations until the time they were able to perform cuff pressure were cycled q15 minutes. At 0138 patient respiratory rate dropped from 40s down to 20s and heart rate rapidly dropped to 86 with EKG changes. Elink emergently called and videoed into patient room.  Stat 12-lead obtained, respiratory paged to bedside and patient given emergent medications (see MAR)  at approximately 0142 to help with rhythm changes. Unfortunately even with medications patient went into asystole and time of death was 42 with confirmation from myself and Caryl Pina, Therapist, sports.

## 2021-03-07 NOTE — Progress Notes (Signed)
ABG collected 2155. No new orders post abg per MD.

## 2021-03-07 DEATH — deceased

## 2021-03-09 DIAGNOSIS — E162 Hypoglycemia, unspecified: Secondary | ICD-10-CM | POA: Diagnosis present

## 2021-03-30 MED FILL — Medication: Qty: 1 | Status: AC

## 2021-04-07 NOTE — Death Summary Note (Signed)
DEATH SUMMARY   Patient Details  Name: Patrick Anthony MRN: [id number] DOB: Aug 12, 1970  Admission/Discharge Information   Admit Date:  2021-03-02  Date of Death: Date of Death: 2021/03/09  Time of Death: Time of Death: 0150  Length of Stay: 7  Referring Physician: [name], MD   Reason(s) for Hospitalization  Altered mental status, acute encephalopathy  Diagnoses  Preliminary cause of death:  Acute respiratory failure with hypoxia due to healthcare associated pneumonia  Secondary Diagnoses (including complications and co-morbidities):  Principal Problem:   Acute respiratory failure with hypoxia ([place]) Active Problems:   HTN (hypertension)   ESRD on dialysis ([place])   HCAP (healthcare-associated pneumonia)   S/P minimally-invasive mitral valve replacement with bioprosthetic valve   Acute liver failure without hepatic coma   DNR (do not resuscitate)   Tachycardia   Pneumonia due to COVID-19 virus   Chronic prescription opiate use   Hyperammonemia ([place])   Hepatic failure, unspecified with coma [facility])   Hypoglycemia   Brief Hospital Course (including significant findings, care, treatment, and services provided and events leading to death)  Patrick Anthony is a 50 y.o. year old male with a history of endocarditis and diskitis with recent bacteremia, ESRD who presented from [place] with hypoglycemia after he was found unresponsive on the morning of admission. He was diaphoretic but did not regain consciousness when given supplemental dextrose. He developed progressive hypoxia and required intubation. He had a history of MRSA aortic endocarditis, diskitis, and bacteremia in February & June 2022, bioprosthetic MVR, HFpEF, chronic PH, ESRD on HD, BKA. He was discharged on vancomycin; last dose unknown.  He received broad-spectrum antibiotics to treat healthcare associated pneumonia and was started on pressors for associated septic shock. He self extubated on 7/11 and was able to  maintain airway protection with somewhat improved respiratory status, did not require reintubation.  He remained encephalopathic for several days.  Norepinephrine was weaned to off, vasopressin was stopped.  His FiO2 was decreased, weaned to oxygen 4 L/min.  On 7/16 he was more active, had some agitated delirium requiring Precedex.  Also with progressive tachycardia.  Given his overall baseline condition, medical problems and prognosis for meaningful recovery his CODE STATUS was DNR/I after discussions with the patient's daughter.  Despite all aggressive care patient unfortunately developed abrupt bradycardia and shock with an idioventricular rhythm on 03/10/23.  Medical resuscitative efforts were initiated with bicarbonate, dopamine, calcium but no CPR was performed.  He expired later in the morning on 10-Mar-2023.      Pertinent Labs and Studies  Significant Diagnostic Studies DG Abdomen 1 View  Result Date: March 02, 2021 CLINICAL DATA:  Orogastric tube placement. EXAM: ABDOMEN - 1 VIEW COMPARISON:  Chest, abdomen and pelvis CT dated 09/29/2020. FINDINGS: Orogastric 2 tip in the proximal to mid stomach and side hole in the proximal stomach. Normal bowel gas pattern. Mildly prominent stool in the rectum and elsewhere in the colon. Stable coarse oval calcification in the left kidney. Lower thoracic spine degenerative changes. IMPRESSION: 1. Orogastric tube tip and side hole in the stomach. 2. No acute abnormality. Electronically Signed   By: Claudie Revering M.D.   On: 02-Mar-2021 10:31   CT HEAD WO CONTRAST  Result Date: March 02, 2021 CLINICAL DATA:  Persistent or worsening altered mental status EXAM: CT HEAD WITHOUT CONTRAST TECHNIQUE: Contiguous axial images were obtained from the base of the skull through the vertex without intravenous contrast. COMPARISON:  MRI Dec 25, 2020 and CT Dec 22, 2020 FINDINGS: Brain: Similar appearance  of the hypodensity in the right frontal periventricular white matter, sequela of prior  vascular insult. The previously visualized punctate foci of restricted diffusion and microhemorrhage on prior MRI consistent with embolic process are not well seen on today's examination. No evidence of acute large vascular territory infarction, hemorrhage, hydrocephalus, extra-axial collection or mass lesion/mass effect. Vascular: No hyperdense vessel. Atherosclerotic calcifications of the internal carotid arteries at skull base. Skull: Normal. Negative for fracture or focal lesion. Sinuses/Orbits: Mucosal thickening of the sphenoid sinuses and ethmoid air cells. Mastoid air cells are predominantly clear. Orbits are grossly unremarkable. Other: None IMPRESSION: 1. No CT evidence of acute intracranial pathology. 2. Similar appearance of the hypodensity in the right frontal periventricular white matter, sequela of prior vascular insult. 3. The previously visualized punctate foci of restricted diffusion and microhemorrhage on prior MRI consistent with embolic process are not well seen on today's examination. 4. Mucosal thickening of the sphenoid sinuses and ethmoid air cells. Electronically Signed   By: Dahlia Bailiff MD   On: 03/06/2021 15:14   DG CHEST PORT 1 VIEW  Result Date: 02/15/2021 CLINICAL DATA:  Hypoxia. COVID-19 infection. History of left upper lobe mass, hypertension and congestive heart failure. EXAM: PORTABLE CHEST 1 VIEW COMPARISON:  Radiograph 02/25/2021 and 04/19/2013.  CT 01/23/2021. FINDINGS: 0955 hours. Interval extubation and removal of the enteric tube. Peripheral left subclavian vascular stent remains in place. Stable cardiomegaly post mitral valve surgery. A loculated right pleural effusion has minimally improved. There is stable partial right lung atelectasis, and no edema or pneumothorax. 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 IMPRESSION: Interval extubation with mild improvement in the loculated right  pleural effusion. No new findings. Electronically Signed   By: Richardean Sale M.D.   On: 02/15/2021 12:52   DG CHEST PORT 1 VIEW  Result Date: 02/28/2021 CLINICAL DATA:  50 year old male with central line placement. EXAM: PORTABLE CHEST 1 VIEW COMPARISON:  Chest radiograph dated 02/07/2021. FINDINGS: Endotracheal tube with tip approximately 3 cm above the carina. Enteric tube extends below the diaphragm with tip beyond the inferior margin of the image. Additional support line noted across the upper chest, likely overlie the patient. Similar appearance of right pleural effusion. Slight improvement in aeration of the right lung with decrease in the diffuse pulmonary opacity since the earlier radiograph. Stable left lower lung field densities. No pneumothorax. Stable enlarged cardiomediastinal silhouette. Left subclavian vascular stent. No acute osseous pathology. IMPRESSION: 1. Slight improvement in aeration of the right lung. No other interval change. 2. Stable positioning of the support apparatus. Electronically Signed   By: Anner Crete M.D.   On: 02/27/2021 19:21   DG Chest Portable 1 View  Result Date: 02/16/2021 CLINICAL DATA:  Intubated. EXAM: PORTABLE CHEST 1 VIEW COMPARISON:  Earlier today. FINDINGS: Interval endotracheal tube in satisfactory position. Interval nasogastric tube extending into the stomach. Stable bilateral pleural effusions, right lung airspace opacity and left basilar linear atelectasis. Stable enlarged cardiac silhouette. Stable left subclavian stent. Stable right shoulder calcific density. IMPRESSION: 1. Stable right pleural effusion and possible pneumonia. 2. Stable small left pleural effusion and left basilar linear atelectasis. 3. Stable cardiomegaly. Electronically Signed   By: Claudie Revering M.D.   On: 02/07/2021 10:29   DG Chest Portable 1 View  Result Date: 02/27/2021 CLINICAL DATA:  Respiratory distress. EXAM: PORTABLE CHEST 1 VIEW COMPARISON:  Chest x-rays dated  01/23/2021 and 12/22/2020. Chest CT angiogram dated 01/23/2021. FINDINGS: Stable cardiomegaly. Overall  cardiomediastinal silhouette is grossly stable in size and configuration. Patchy airspace opacities within the RIGHT lung, corresponding to the partially loculated pleural effusions demonstrated on earlier chest CT, small to moderate in size. Cannot exclude superimposed pneumonia within the RIGHT mid lung region. Stable mild atelectasis and small pleural effusion at the LEFT lung base. No new lung findings. No pneumothorax is seen. IMPRESSION: 1. Patchy airspace opacities within the RIGHT lung, presumably corresponding to the partially loculated pleural effusions demonstrated on earlier chest CT, small to moderate in size. However, cannot exclude superimposed pneumonia within the RIGHT mid lung region. 2. Cardiomegaly. Electronically Signed   By: Franki Cabot M.D.   On: 03/03/2021 10:12   DG Abd Portable 1V  Result Date: 02/16/2021 CLINICAL DATA:  Feeding tube placement. EXAM: PORTABLE ABDOMEN - 1 VIEW COMPARISON:  Abdominal radiograph 02/24/2021 FINDINGS: The feeding tube tip is in the antropyloric region of the stomach. Stable unremarkable bowel gas pattern. Stable left renal calcification. Left femoral catheters are noted. IMPRESSION: Feeding tube tip is in the antropyloric region of the stomach. Electronically Signed   By: Marijo Sanes M.D.   On: 02/16/2021 15:03   US LIVER DOPPLER  Result Date: 02/17/2021 CLINICAL DATA:  Liver failure EXAM: DUPLEX ULTRASOUND OF LIVER TECHNIQUE: Color and duplex Doppler ultrasound was performed to evaluate the hepatic in-flow and out-flow vessels. COMPARISON:  CT abdomen and pelvis 09/27/2020 FINDINGS: Liver: Normal parenchymal echogenicity. Normal hepatic contour without nodularity. No focal lesion, mass or intrahepatic biliary ductal dilatation. Main Portal Vein size: 0.9 cm Portal Vein Velocities Main Prox:  44 cm/sec Main Mid: 51 cm/sec Main Dist:  71 cm/sec  Right: 44 cm/sec Left: Nonvisualized Hepatic Vein Velocities Right:  14 cm/sec Middle:  43 cm/sec Left:  23 cm/sec IVC: Present and patent with normal respiratory phasicity. Hepatic Artery Velocity:  26 cm/sec Splenic Vein Velocity:  17 cm/sec Spleen: 11.3 cm x 4.0 cm x 4.5 cm with a total volume of 105 cm^3 (411 cm^3 is upper limit normal) Portal Vein Occlusion/Thrombus: No Splenic Vein Occlusion/Thrombus: No Ascites: Trace perihepatic ascites. Varices: None IMPRESSION: Portal vein is patent. Bidirectional flow noted within the portal vein is suspicious for portal hypertension. Electronically Signed   By: Miachel Roux M.D.   On: 02/17/2021 10:09     Collene Gobble 03/09/2021, 1:42 PM
